# Patient Record
Sex: Female | Born: 1955 | Race: White | Hispanic: No | Marital: Married | Smoking: Never smoker
Health system: Southern US, Community
[De-identification: ages and names within clinical notes are randomized; demographics above are authoritative.]

## PROBLEM LIST (undated history)

## (undated) DIAGNOSIS — D649 Anemia, unspecified: Secondary | ICD-10-CM

## (undated) DIAGNOSIS — K648 Other hemorrhoids: Secondary | ICD-10-CM

## (undated) DIAGNOSIS — N3010 Interstitial cystitis (chronic) without hematuria: Secondary | ICD-10-CM

## (undated) DIAGNOSIS — M5136 Other intervertebral disc degeneration, lumbar region: Secondary | ICD-10-CM

## (undated) DIAGNOSIS — R7612 Nonspecific reaction to cell mediated immunity measurement of gamma interferon antigen response without active tuberculosis: Secondary | ICD-10-CM

## (undated) DIAGNOSIS — M1380 Other specified arthritis, unspecified site: Secondary | ICD-10-CM

## (undated) DIAGNOSIS — R109 Unspecified abdominal pain: Secondary | ICD-10-CM

## (undated) DIAGNOSIS — F329 Major depressive disorder, single episode, unspecified: Secondary | ICD-10-CM

## (undated) DIAGNOSIS — H3553 Other dystrophies primarily involving the sensory retina: Secondary | ICD-10-CM

## (undated) DIAGNOSIS — A159 Respiratory tuberculosis unspecified: Secondary | ICD-10-CM

## (undated) DIAGNOSIS — I10 Essential (primary) hypertension: Secondary | ICD-10-CM

## (undated) DIAGNOSIS — G8929 Other chronic pain: Secondary | ICD-10-CM

## (undated) DIAGNOSIS — R112 Nausea with vomiting, unspecified: Secondary | ICD-10-CM

## (undated) DIAGNOSIS — L03012 Cellulitis of left finger: Secondary | ICD-10-CM

## (undated) DIAGNOSIS — Z9889 Other specified postprocedural states: Secondary | ICD-10-CM

## (undated) DIAGNOSIS — F419 Anxiety disorder, unspecified: Secondary | ICD-10-CM

## (undated) DIAGNOSIS — K819 Cholecystitis, unspecified: Secondary | ICD-10-CM

## (undated) DIAGNOSIS — R519 Headache, unspecified: Secondary | ICD-10-CM

## (undated) DIAGNOSIS — K219 Gastro-esophageal reflux disease without esophagitis: Secondary | ICD-10-CM

## (undated) DIAGNOSIS — F319 Bipolar disorder, unspecified: Secondary | ICD-10-CM

## (undated) DIAGNOSIS — K589 Irritable bowel syndrome without diarrhea: Secondary | ICD-10-CM

## (undated) DIAGNOSIS — M797 Fibromyalgia: Secondary | ICD-10-CM

## (undated) DIAGNOSIS — I7300 Raynaud's syndrome without gangrene: Secondary | ICD-10-CM

## (undated) DIAGNOSIS — M5090 Cervical disc disorder, unspecified, unspecified cervical region: Secondary | ICD-10-CM

## (undated) DIAGNOSIS — T7840XA Allergy, unspecified, initial encounter: Secondary | ICD-10-CM

## (undated) DIAGNOSIS — IMO0002 Reserved for concepts with insufficient information to code with codable children: Secondary | ICD-10-CM

## (undated) DIAGNOSIS — F32A Depression, unspecified: Secondary | ICD-10-CM

## (undated) HISTORY — PX: CYSTOSCOPY: SUR368

## (undated) HISTORY — DX: Other chronic pain: G89.29

## (undated) HISTORY — PX: HERNIA REPAIR: SHX51

## (undated) HISTORY — DX: Fibromyalgia: M79.7

## (undated) HISTORY — DX: Major depressive disorder, single episode, unspecified: F32.9

## (undated) HISTORY — DX: Other dystrophies primarily involving the sensory retina: H35.53

## (undated) HISTORY — DX: Cholecystitis, unspecified: K81.9

## (undated) HISTORY — PX: JOINT REPLACEMENT: SHX530

## (undated) HISTORY — DX: Reserved for concepts with insufficient information to code with codable children: IMO0002

## (undated) HISTORY — DX: Irritable bowel syndrome, unspecified: K58.9

## (undated) HISTORY — DX: Depression, unspecified: F32.A

## (undated) HISTORY — DX: Nonspecific reaction to cell mediated immunity measurement of gamma interferon antigen response without active tuberculosis: R76.12

## (undated) HISTORY — PX: NASAL SINUS SURGERY: SHX719

## (undated) HISTORY — DX: Gastro-esophageal reflux disease without esophagitis: K21.9

## (undated) HISTORY — DX: Other hemorrhoids: K64.8

## (undated) HISTORY — PX: HYSTEROSCOPY W/ ENDOMETRIAL ABLATION: SUR665

## (undated) HISTORY — DX: Unspecified porphyria: E80.20

## (undated) HISTORY — PX: TONSILLECTOMY: SUR1361

## (undated) HISTORY — PX: BUNIONECTOMY: SHX129

## (undated) HISTORY — DX: Anemia, unspecified: D64.9

## (undated) HISTORY — DX: Cellulitis of left finger: L03.012

## (undated) HISTORY — DX: Anxiety disorder, unspecified: F41.9

## (undated) HISTORY — DX: Other intervertebral disc degeneration, lumbar region: M51.36

## (undated) HISTORY — DX: Other specified arthritis, unspecified site: M13.80

## (undated) HISTORY — PX: UPPER GASTROINTESTINAL ENDOSCOPY: SHX188

## (undated) HISTORY — DX: Allergy, unspecified, initial encounter: T78.40XA

---

## 1998-12-03 ENCOUNTER — Ambulatory Visit (HOSPITAL_COMMUNITY): Admission: RE | Admit: 1998-12-03 | Discharge: 1998-12-03 | Payer: Self-pay | Admitting: Gynecology

## 1999-10-22 ENCOUNTER — Other Ambulatory Visit: Admission: RE | Admit: 1999-10-22 | Discharge: 1999-10-22 | Payer: Self-pay | Admitting: *Deleted

## 2001-02-09 ENCOUNTER — Other Ambulatory Visit: Admission: RE | Admit: 2001-02-09 | Discharge: 2001-02-09 | Payer: Self-pay | Admitting: Obstetrics and Gynecology

## 2002-01-11 ENCOUNTER — Ambulatory Visit (HOSPITAL_BASED_OUTPATIENT_CLINIC_OR_DEPARTMENT_OTHER): Admission: RE | Admit: 2002-01-11 | Discharge: 2002-01-11 | Payer: Self-pay | Admitting: *Deleted

## 2002-03-08 ENCOUNTER — Other Ambulatory Visit: Admission: RE | Admit: 2002-03-08 | Discharge: 2002-03-08 | Payer: Self-pay | Admitting: Obstetrics and Gynecology

## 2002-07-25 HISTORY — PX: ANTERIOR CERVICAL DECOMP/DISCECTOMY FUSION: SHX1161

## 2002-08-07 ENCOUNTER — Observation Stay (HOSPITAL_COMMUNITY): Admission: RE | Admit: 2002-08-07 | Discharge: 2002-08-08 | Payer: Self-pay | Admitting: Orthopaedic Surgery

## 2002-11-22 ENCOUNTER — Ambulatory Visit (HOSPITAL_COMMUNITY): Admission: RE | Admit: 2002-11-22 | Discharge: 2002-11-22 | Payer: Self-pay | Admitting: *Deleted

## 2003-04-25 ENCOUNTER — Other Ambulatory Visit: Admission: RE | Admit: 2003-04-25 | Discharge: 2003-04-25 | Payer: Self-pay | Admitting: Obstetrics and Gynecology

## 2003-08-16 ENCOUNTER — Encounter: Admission: RE | Admit: 2003-08-16 | Discharge: 2003-08-16 | Payer: Self-pay | Admitting: Internal Medicine

## 2003-08-25 ENCOUNTER — Encounter: Admission: RE | Admit: 2003-08-25 | Discharge: 2003-08-25 | Payer: Self-pay | Admitting: Internal Medicine

## 2004-03-23 ENCOUNTER — Encounter: Admission: RE | Admit: 2004-03-23 | Discharge: 2004-03-23 | Payer: Self-pay | Admitting: Rheumatology

## 2004-03-27 ENCOUNTER — Encounter: Admission: RE | Admit: 2004-03-27 | Discharge: 2004-03-27 | Payer: Self-pay | Admitting: Orthopedic Surgery

## 2004-04-29 ENCOUNTER — Other Ambulatory Visit: Admission: RE | Admit: 2004-04-29 | Discharge: 2004-04-29 | Payer: Self-pay | Admitting: Obstetrics and Gynecology

## 2004-05-28 ENCOUNTER — Ambulatory Visit: Payer: Self-pay | Admitting: Internal Medicine

## 2004-07-01 ENCOUNTER — Ambulatory Visit: Payer: Self-pay | Admitting: Internal Medicine

## 2004-09-03 ENCOUNTER — Ambulatory Visit: Payer: Self-pay | Admitting: Internal Medicine

## 2004-09-06 ENCOUNTER — Ambulatory Visit: Payer: Self-pay | Admitting: Internal Medicine

## 2004-11-04 ENCOUNTER — Ambulatory Visit: Payer: Self-pay | Admitting: Internal Medicine

## 2005-02-10 ENCOUNTER — Ambulatory Visit: Payer: Self-pay | Admitting: Internal Medicine

## 2005-04-20 ENCOUNTER — Ambulatory Visit: Payer: Self-pay | Admitting: Internal Medicine

## 2005-05-02 ENCOUNTER — Other Ambulatory Visit: Admission: RE | Admit: 2005-05-02 | Discharge: 2005-05-02 | Payer: Self-pay | Admitting: Obstetrics and Gynecology

## 2005-06-19 ENCOUNTER — Encounter: Admission: RE | Admit: 2005-06-19 | Discharge: 2005-06-19 | Payer: Self-pay | Admitting: Rheumatology

## 2005-06-20 ENCOUNTER — Ambulatory Visit: Payer: Self-pay | Admitting: Internal Medicine

## 2005-08-19 ENCOUNTER — Ambulatory Visit: Payer: Self-pay | Admitting: Internal Medicine

## 2005-10-20 ENCOUNTER — Ambulatory Visit: Payer: Self-pay | Admitting: Internal Medicine

## 2005-11-16 ENCOUNTER — Ambulatory Visit: Payer: Self-pay | Admitting: Internal Medicine

## 2005-12-14 ENCOUNTER — Encounter: Admission: RE | Admit: 2005-12-14 | Discharge: 2005-12-14 | Payer: Self-pay | Admitting: Internal Medicine

## 2005-12-14 ENCOUNTER — Ambulatory Visit: Payer: Self-pay | Admitting: Internal Medicine

## 2006-01-18 ENCOUNTER — Ambulatory Visit: Payer: Self-pay | Admitting: Internal Medicine

## 2006-03-07 ENCOUNTER — Ambulatory Visit: Payer: Self-pay | Admitting: Internal Medicine

## 2006-04-17 ENCOUNTER — Ambulatory Visit: Payer: Self-pay | Admitting: Internal Medicine

## 2006-04-27 ENCOUNTER — Ambulatory Visit: Payer: Self-pay | Admitting: Internal Medicine

## 2006-05-17 ENCOUNTER — Ambulatory Visit: Payer: Self-pay | Admitting: Internal Medicine

## 2006-06-12 ENCOUNTER — Ambulatory Visit: Payer: Self-pay | Admitting: Internal Medicine

## 2006-07-11 ENCOUNTER — Ambulatory Visit: Payer: Self-pay | Admitting: Internal Medicine

## 2006-08-09 ENCOUNTER — Ambulatory Visit: Payer: Self-pay | Admitting: Gastroenterology

## 2006-08-14 ENCOUNTER — Ambulatory Visit: Payer: Self-pay | Admitting: Internal Medicine

## 2006-09-11 ENCOUNTER — Ambulatory Visit: Payer: Self-pay | Admitting: Internal Medicine

## 2006-10-10 ENCOUNTER — Ambulatory Visit: Payer: Self-pay | Admitting: Internal Medicine

## 2006-10-17 ENCOUNTER — Ambulatory Visit (HOSPITAL_COMMUNITY): Admission: RE | Admit: 2006-10-17 | Discharge: 2006-10-17 | Payer: Self-pay | Admitting: Gastroenterology

## 2006-10-20 ENCOUNTER — Ambulatory Visit: Payer: Self-pay | Admitting: Internal Medicine

## 2006-10-20 LAB — CONVERTED CEMR LAB
Eosinophils Absolute: 0.1 10*3/uL (ref 0.0–0.6)
Eosinophils Relative: 0.7 % (ref 0.0–5.0)
IgA: 85 mg/dL (ref 68–378)
IgE (Immunoglobulin E), Serum: 89.9 intl units/mL (ref 0.0–180.0)
Lymphocytes Relative: 29.5 % (ref 12.0–46.0)
MCV: 84.7 fL (ref 78.0–100.0)
Monocytes Relative: 6 % (ref 3.0–11.0)
Neutro Abs: 6 10*3/uL (ref 1.4–7.7)
Platelets: 372 10*3/uL (ref 150–400)
RBC: 4.37 M/uL (ref 3.87–5.11)
WBC: 9.6 10*3/uL (ref 4.5–10.5)

## 2006-10-31 ENCOUNTER — Ambulatory Visit: Payer: Self-pay | Admitting: Internal Medicine

## 2006-11-16 ENCOUNTER — Ambulatory Visit: Payer: Self-pay | Admitting: Gastroenterology

## 2006-11-20 LAB — HM COLONOSCOPY

## 2006-11-28 ENCOUNTER — Ambulatory Visit: Payer: Self-pay | Admitting: Gastroenterology

## 2006-12-06 DIAGNOSIS — M797 Fibromyalgia: Secondary | ICD-10-CM | POA: Insufficient documentation

## 2006-12-06 DIAGNOSIS — N3010 Interstitial cystitis (chronic) without hematuria: Secondary | ICD-10-CM | POA: Insufficient documentation

## 2006-12-07 ENCOUNTER — Ambulatory Visit: Payer: Self-pay | Admitting: Internal Medicine

## 2007-02-05 ENCOUNTER — Ambulatory Visit: Payer: Self-pay | Admitting: Internal Medicine

## 2007-03-05 ENCOUNTER — Ambulatory Visit: Payer: Self-pay | Admitting: Internal Medicine

## 2007-03-05 DIAGNOSIS — F3189 Other bipolar disorder: Secondary | ICD-10-CM | POA: Insufficient documentation

## 2007-03-05 DIAGNOSIS — F489 Nonpsychotic mental disorder, unspecified: Secondary | ICD-10-CM | POA: Insufficient documentation

## 2007-03-05 DIAGNOSIS — L719 Rosacea, unspecified: Secondary | ICD-10-CM | POA: Insufficient documentation

## 2007-03-05 DIAGNOSIS — F331 Major depressive disorder, recurrent, moderate: Secondary | ICD-10-CM | POA: Insufficient documentation

## 2007-03-05 DIAGNOSIS — F5105 Insomnia due to other mental disorder: Secondary | ICD-10-CM | POA: Insufficient documentation

## 2007-03-05 DIAGNOSIS — I7300 Raynaud's syndrome without gangrene: Secondary | ICD-10-CM | POA: Insufficient documentation

## 2007-03-05 DIAGNOSIS — K219 Gastro-esophageal reflux disease without esophagitis: Secondary | ICD-10-CM | POA: Insufficient documentation

## 2007-03-05 DIAGNOSIS — R5381 Other malaise: Secondary | ICD-10-CM | POA: Insufficient documentation

## 2007-03-05 DIAGNOSIS — M792 Neuralgia and neuritis, unspecified: Secondary | ICD-10-CM | POA: Insufficient documentation

## 2007-03-05 DIAGNOSIS — M1990 Unspecified osteoarthritis, unspecified site: Secondary | ICD-10-CM | POA: Insufficient documentation

## 2007-03-05 DIAGNOSIS — R5383 Other fatigue: Secondary | ICD-10-CM | POA: Insufficient documentation

## 2007-03-05 DIAGNOSIS — IMO0002 Reserved for concepts with insufficient information to code with codable children: Secondary | ICD-10-CM | POA: Insufficient documentation

## 2007-03-19 ENCOUNTER — Telehealth: Payer: Self-pay | Admitting: Internal Medicine

## 2007-03-29 ENCOUNTER — Telehealth: Payer: Self-pay | Admitting: *Deleted

## 2007-04-12 ENCOUNTER — Ambulatory Visit: Payer: Self-pay | Admitting: Internal Medicine

## 2007-04-12 DIAGNOSIS — J309 Allergic rhinitis, unspecified: Secondary | ICD-10-CM | POA: Insufficient documentation

## 2007-04-12 LAB — CONVERTED CEMR LAB: TSH: 1.1 microintl units/mL (ref 0.35–5.50)

## 2007-05-07 ENCOUNTER — Telehealth: Payer: Self-pay | Admitting: *Deleted

## 2007-05-18 ENCOUNTER — Encounter: Payer: Self-pay | Admitting: Internal Medicine

## 2007-05-25 ENCOUNTER — Ambulatory Visit: Payer: Self-pay | Admitting: Internal Medicine

## 2007-06-14 ENCOUNTER — Telehealth: Payer: Self-pay | Admitting: *Deleted

## 2007-07-05 ENCOUNTER — Encounter: Payer: Self-pay | Admitting: Internal Medicine

## 2007-08-08 ENCOUNTER — Ambulatory Visit: Payer: Self-pay | Admitting: Internal Medicine

## 2007-09-06 ENCOUNTER — Ambulatory Visit: Payer: Self-pay | Admitting: Cardiology

## 2007-09-06 ENCOUNTER — Ambulatory Visit: Payer: Self-pay | Admitting: Internal Medicine

## 2007-09-06 DIAGNOSIS — R1031 Right lower quadrant pain: Secondary | ICD-10-CM | POA: Insufficient documentation

## 2007-09-06 LAB — CONVERTED CEMR LAB
BUN: 12 mg/dL (ref 6–23)
Basophils Absolute: 0 10*3/uL (ref 0.0–0.1)
Basophils Relative: 0 % (ref 0.0–1.0)
CO2: 30 meq/L (ref 19–32)
Calcium: 9.3 mg/dL (ref 8.4–10.5)
Chloride: 93 meq/L — ABNORMAL LOW (ref 96–112)
Creatinine, Ser: 0.9 mg/dL (ref 0.4–1.2)
Hemoglobin: 13.7 g/dL (ref 12.0–15.0)
MCHC: 33 g/dL (ref 30.0–36.0)
Monocytes Relative: 9.3 % (ref 3.0–11.0)
Platelets: 323 10*3/uL (ref 150–400)
Potassium: 4.5 meq/L (ref 3.5–5.1)
RBC: 4.78 M/uL (ref 3.87–5.11)
RDW: 12.4 % (ref 11.5–14.6)

## 2007-09-14 ENCOUNTER — Ambulatory Visit (HOSPITAL_BASED_OUTPATIENT_CLINIC_OR_DEPARTMENT_OTHER): Admission: RE | Admit: 2007-09-14 | Discharge: 2007-09-14 | Payer: Self-pay | Admitting: Urology

## 2007-10-11 ENCOUNTER — Ambulatory Visit: Payer: Self-pay | Admitting: Internal Medicine

## 2007-10-11 DIAGNOSIS — K5900 Constipation, unspecified: Secondary | ICD-10-CM | POA: Insufficient documentation

## 2007-10-11 DIAGNOSIS — K5903 Drug induced constipation: Secondary | ICD-10-CM | POA: Insufficient documentation

## 2007-10-15 ENCOUNTER — Telehealth: Payer: Self-pay | Admitting: Internal Medicine

## 2007-11-09 ENCOUNTER — Ambulatory Visit: Payer: Self-pay | Admitting: Internal Medicine

## 2007-11-09 DIAGNOSIS — F5104 Psychophysiologic insomnia: Secondary | ICD-10-CM | POA: Insufficient documentation

## 2007-11-09 DIAGNOSIS — G4700 Insomnia, unspecified: Secondary | ICD-10-CM | POA: Insufficient documentation

## 2007-11-20 ENCOUNTER — Encounter: Payer: Self-pay | Admitting: Internal Medicine

## 2007-11-21 ENCOUNTER — Ambulatory Visit: Payer: Self-pay | Admitting: Gastroenterology

## 2007-11-26 ENCOUNTER — Telehealth: Payer: Self-pay | Admitting: Internal Medicine

## 2007-12-07 ENCOUNTER — Ambulatory Visit: Payer: Self-pay | Admitting: Internal Medicine

## 2007-12-12 ENCOUNTER — Telehealth: Payer: Self-pay | Admitting: Internal Medicine

## 2007-12-25 ENCOUNTER — Encounter: Payer: Self-pay | Admitting: Internal Medicine

## 2007-12-27 ENCOUNTER — Ambulatory Visit: Payer: Self-pay | Admitting: Internal Medicine

## 2007-12-28 ENCOUNTER — Encounter: Payer: Self-pay | Admitting: Internal Medicine

## 2008-01-29 ENCOUNTER — Encounter: Payer: Self-pay | Admitting: Internal Medicine

## 2008-01-31 ENCOUNTER — Ambulatory Visit: Payer: Self-pay | Admitting: Internal Medicine

## 2008-01-31 DIAGNOSIS — R635 Abnormal weight gain: Secondary | ICD-10-CM | POA: Insufficient documentation

## 2008-01-31 DIAGNOSIS — K120 Recurrent oral aphthae: Secondary | ICD-10-CM | POA: Insufficient documentation

## 2008-02-28 ENCOUNTER — Ambulatory Visit: Payer: Self-pay | Admitting: Internal Medicine

## 2008-02-28 DIAGNOSIS — R252 Cramp and spasm: Secondary | ICD-10-CM | POA: Insufficient documentation

## 2008-02-28 DIAGNOSIS — R079 Chest pain, unspecified: Secondary | ICD-10-CM | POA: Insufficient documentation

## 2008-02-28 LAB — CONVERTED CEMR LAB
Folate: 10.6 ng/mL
Magnesium: 2.4 mg/dL (ref 1.5–2.5)
Potassium: 5 meq/L (ref 3.5–5.1)
Vit D, 1,25-Dihydroxy: 42 (ref 30–89)
Vitamin B-12: 632 pg/mL (ref 211–911)

## 2008-04-28 ENCOUNTER — Ambulatory Visit: Payer: Self-pay | Admitting: Internal Medicine

## 2008-05-27 ENCOUNTER — Ambulatory Visit: Payer: Self-pay | Admitting: Internal Medicine

## 2008-06-03 ENCOUNTER — Encounter: Payer: Self-pay | Admitting: Internal Medicine

## 2008-06-03 ENCOUNTER — Telehealth: Payer: Self-pay | Admitting: Internal Medicine

## 2008-06-26 ENCOUNTER — Ambulatory Visit: Payer: Self-pay | Admitting: Internal Medicine

## 2008-07-07 ENCOUNTER — Encounter: Payer: Self-pay | Admitting: Internal Medicine

## 2008-07-25 HISTORY — PX: BREAST ENHANCEMENT SURGERY: SHX7

## 2008-07-28 ENCOUNTER — Ambulatory Visit: Payer: Self-pay | Admitting: Internal Medicine

## 2008-09-09 ENCOUNTER — Ambulatory Visit: Payer: Self-pay | Admitting: Internal Medicine

## 2008-10-06 ENCOUNTER — Encounter: Payer: Self-pay | Admitting: Internal Medicine

## 2008-10-08 ENCOUNTER — Encounter: Payer: Self-pay | Admitting: Internal Medicine

## 2008-10-29 ENCOUNTER — Ambulatory Visit: Payer: Self-pay | Admitting: Internal Medicine

## 2008-10-29 LAB — CONVERTED CEMR LAB
ALT: 13 units/L (ref 0–35)
AST: 18 units/L (ref 0–37)
Alkaline Phosphatase: 49 units/L (ref 39–117)
Basophils Relative: 0.4 % (ref 0.0–3.0)
Bilirubin, Direct: 0 mg/dL (ref 0.0–0.3)
Calcium: 9.3 mg/dL (ref 8.4–10.5)
Chloride: 105 meq/L (ref 96–112)
Creatinine, Ser: 0.6 mg/dL (ref 0.4–1.2)
Eosinophils Relative: 1.1 % (ref 0.0–5.0)
HDL: 70.4 mg/dL (ref >39.00)
Hemoglobin: 11.8 g/dL — ABNORMAL LOW (ref 12.0–15.0)
Ketones, urine, test strip: NEGATIVE
LDL Cholesterol: 82 mg/dL (ref 0–99)
Lymphocytes Relative: 32.7 % (ref 12.0–46.0)
Neutro Abs: 4.1 10*3/uL (ref 1.4–7.7)
Neutrophils Relative %: 57.4 % (ref 43.0–77.0)
Nitrite: NEGATIVE
RBC: 4.01 M/uL (ref 3.87–5.11)
Sodium: 139 meq/L (ref 135–145)
Total CHOL/HDL Ratio: 2
Total Protein: 6.5 g/dL (ref 6.0–8.3)
Triglycerides: 111 mg/dL (ref 0.0–149.0)
Urobilinogen, UA: 0.2
WBC Urine, dipstick: NEGATIVE
WBC: 7.1 10*3/uL (ref 4.5–10.5)

## 2008-11-05 ENCOUNTER — Ambulatory Visit: Payer: Self-pay | Admitting: Internal Medicine

## 2008-12-30 ENCOUNTER — Ambulatory Visit: Payer: Self-pay | Admitting: Internal Medicine

## 2008-12-30 DIAGNOSIS — R634 Abnormal weight loss: Secondary | ICD-10-CM | POA: Insufficient documentation

## 2008-12-30 LAB — CONVERTED CEMR LAB
AST: 22 units/L (ref 0–37)
Alkaline Phosphatase: 51 units/L (ref 39–117)
Bilirubin, Direct: 0.1 mg/dL (ref 0.0–0.3)
Hgb A1c MFr Bld: 5.9 % (ref 4.6–6.5)
TSH: 1.12 microintl units/mL (ref 0.35–5.50)
Total Bilirubin: 0.5 mg/dL (ref 0.3–1.2)

## 2008-12-31 ENCOUNTER — Encounter: Payer: Self-pay | Admitting: Internal Medicine

## 2009-01-01 ENCOUNTER — Telehealth: Payer: Self-pay | Admitting: Internal Medicine

## 2009-01-05 ENCOUNTER — Telehealth: Payer: Self-pay | Admitting: Internal Medicine

## 2009-01-06 ENCOUNTER — Telehealth: Payer: Self-pay | Admitting: Internal Medicine

## 2009-01-20 ENCOUNTER — Telehealth: Payer: Self-pay | Admitting: Internal Medicine

## 2009-01-20 ENCOUNTER — Ambulatory Visit: Payer: Self-pay | Admitting: Internal Medicine

## 2009-01-21 ENCOUNTER — Encounter: Payer: Self-pay | Admitting: Internal Medicine

## 2009-01-28 ENCOUNTER — Ambulatory Visit: Payer: Self-pay | Admitting: Internal Medicine

## 2009-02-10 ENCOUNTER — Encounter: Payer: Self-pay | Admitting: Internal Medicine

## 2009-03-12 ENCOUNTER — Encounter: Payer: Self-pay | Admitting: Internal Medicine

## 2009-03-17 ENCOUNTER — Ambulatory Visit: Payer: Self-pay | Admitting: Internal Medicine

## 2009-03-17 DIAGNOSIS — D62 Acute posthemorrhagic anemia: Secondary | ICD-10-CM | POA: Insufficient documentation

## 2009-03-17 DIAGNOSIS — D638 Anemia in other chronic diseases classified elsewhere: Secondary | ICD-10-CM

## 2009-03-17 LAB — CONVERTED CEMR LAB
HCT: 33.1 %
MCV: 84.4 fL
Platelets: 306 10*3/uL

## 2009-03-20 ENCOUNTER — Encounter: Payer: Self-pay | Admitting: Internal Medicine

## 2009-03-24 ENCOUNTER — Ambulatory Visit (HOSPITAL_COMMUNITY): Admission: RE | Admit: 2009-03-24 | Discharge: 2009-03-24 | Payer: Self-pay | Admitting: Rheumatology

## 2009-03-26 ENCOUNTER — Encounter: Payer: Self-pay | Admitting: Internal Medicine

## 2009-04-30 ENCOUNTER — Ambulatory Visit: Payer: Self-pay | Admitting: Internal Medicine

## 2009-04-30 DIAGNOSIS — M659 Synovitis and tenosynovitis, unspecified: Secondary | ICD-10-CM | POA: Insufficient documentation

## 2009-06-22 ENCOUNTER — Telehealth: Payer: Self-pay | Admitting: Internal Medicine

## 2009-07-02 ENCOUNTER — Ambulatory Visit: Payer: Self-pay | Admitting: Internal Medicine

## 2009-07-28 ENCOUNTER — Encounter: Payer: Self-pay | Admitting: Internal Medicine

## 2009-07-30 ENCOUNTER — Ambulatory Visit: Payer: Self-pay | Admitting: Internal Medicine

## 2009-08-27 ENCOUNTER — Encounter: Payer: Self-pay | Admitting: Internal Medicine

## 2009-10-01 ENCOUNTER — Ambulatory Visit: Payer: Self-pay | Admitting: Internal Medicine

## 2009-10-15 ENCOUNTER — Ambulatory Visit: Payer: Self-pay | Admitting: Internal Medicine

## 2009-10-15 DIAGNOSIS — N3000 Acute cystitis without hematuria: Secondary | ICD-10-CM | POA: Insufficient documentation

## 2009-10-15 LAB — CONVERTED CEMR LAB
Glucose, Urine, Semiquant: NEGATIVE
Urobilinogen, UA: 0.2

## 2009-10-16 ENCOUNTER — Encounter: Payer: Self-pay | Admitting: Internal Medicine

## 2009-11-09 ENCOUNTER — Encounter: Payer: Self-pay | Admitting: Internal Medicine

## 2009-12-01 ENCOUNTER — Encounter: Payer: Self-pay | Admitting: Internal Medicine

## 2009-12-07 ENCOUNTER — Telehealth: Payer: Self-pay | Admitting: Internal Medicine

## 2009-12-09 ENCOUNTER — Telehealth: Payer: Self-pay | Admitting: Internal Medicine

## 2009-12-17 ENCOUNTER — Telehealth: Payer: Self-pay | Admitting: Internal Medicine

## 2009-12-29 ENCOUNTER — Ambulatory Visit: Payer: Self-pay | Admitting: Internal Medicine

## 2010-01-07 ENCOUNTER — Encounter: Payer: Self-pay | Admitting: Internal Medicine

## 2010-01-22 ENCOUNTER — Telehealth: Payer: Self-pay | Admitting: Internal Medicine

## 2010-01-28 ENCOUNTER — Telehealth: Payer: Self-pay | Admitting: Internal Medicine

## 2010-03-02 ENCOUNTER — Ambulatory Visit: Payer: Self-pay | Admitting: Internal Medicine

## 2010-03-25 ENCOUNTER — Encounter: Payer: Self-pay | Admitting: Internal Medicine

## 2010-03-25 HISTORY — PX: AUGMENTATION MAMMAPLASTY: SUR837

## 2010-03-26 ENCOUNTER — Ambulatory Visit: Payer: Self-pay | Admitting: Internal Medicine

## 2010-03-31 ENCOUNTER — Encounter: Payer: Self-pay | Admitting: Internal Medicine

## 2010-04-19 ENCOUNTER — Encounter: Payer: Self-pay | Admitting: *Deleted

## 2010-04-19 ENCOUNTER — Telehealth: Payer: Self-pay | Admitting: Internal Medicine

## 2010-05-28 ENCOUNTER — Telehealth (INDEPENDENT_AMBULATORY_CARE_PROVIDER_SITE_OTHER): Payer: Self-pay | Admitting: *Deleted

## 2010-05-28 ENCOUNTER — Ambulatory Visit: Payer: Self-pay | Admitting: Internal Medicine

## 2010-05-28 DIAGNOSIS — M5010 Cervical disc disorder with radiculopathy, unspecified cervical region: Secondary | ICD-10-CM | POA: Insufficient documentation

## 2010-06-02 ENCOUNTER — Encounter: Admission: RE | Admit: 2010-06-02 | Discharge: 2010-06-02 | Payer: Self-pay | Admitting: Internal Medicine

## 2010-06-14 ENCOUNTER — Telehealth: Payer: Self-pay | Admitting: Internal Medicine

## 2010-06-23 ENCOUNTER — Telehealth: Payer: Self-pay | Admitting: Internal Medicine

## 2010-07-06 ENCOUNTER — Encounter: Payer: Self-pay | Admitting: Internal Medicine

## 2010-07-12 ENCOUNTER — Telehealth: Payer: Self-pay | Admitting: Internal Medicine

## 2010-07-30 ENCOUNTER — Ambulatory Visit
Admission: RE | Admit: 2010-07-30 | Discharge: 2010-07-30 | Payer: Self-pay | Source: Home / Self Care | Attending: Internal Medicine | Admitting: Internal Medicine

## 2010-07-30 ENCOUNTER — Other Ambulatory Visit: Payer: Self-pay | Admitting: Internal Medicine

## 2010-07-30 LAB — CBC WITH DIFFERENTIAL/PLATELET
Basophils Absolute: 0 10*3/uL (ref 0.0–0.1)
Basophils Relative: 0.5 % (ref 0.0–3.0)
Eosinophils Absolute: 0.1 10*3/uL (ref 0.0–0.7)
Eosinophils Relative: 1.4 % (ref 0.0–5.0)
HCT: 37.9 % (ref 36.0–46.0)
Hemoglobin: 12.8 g/dL (ref 12.0–15.0)
Lymphocytes Relative: 28 % (ref 12.0–46.0)
Lymphs Abs: 2.3 10*3/uL (ref 0.7–4.0)
MCHC: 33.8 g/dL (ref 30.0–36.0)
MCV: 95.5 fl (ref 78.0–100.0)
Monocytes Absolute: 0.5 10*3/uL (ref 0.1–1.0)
Monocytes Relative: 5.7 % (ref 3.0–12.0)
Neutro Abs: 5.3 10*3/uL (ref 1.4–7.7)
Neutrophils Relative %: 64.4 % (ref 43.0–77.0)
Platelets: 286 10*3/uL (ref 150.0–400.0)
RBC: 3.97 Mil/uL (ref 3.87–5.11)
RDW: 13.5 % (ref 11.5–14.6)
WBC: 8.2 10*3/uL (ref 4.5–10.5)

## 2010-07-30 LAB — HIGH SENSITIVITY CRP: CRP, High Sensitivity: 0.8 mg/L (ref 0.00–5.00)

## 2010-07-30 LAB — BASIC METABOLIC PANEL
BUN: 8 mg/dL (ref 6–23)
CO2: 29 mEq/L (ref 19–32)
Calcium: 9.1 mg/dL (ref 8.4–10.5)
Chloride: 105 mEq/L (ref 96–112)
Creatinine, Ser: 0.6 mg/dL (ref 0.4–1.2)
GFR: 109.25 mL/min (ref >60.00)
Glucose, Bld: 88 mg/dL (ref 70–99)
Potassium: 4.4 mEq/L (ref 3.5–5.1)
Sodium: 141 mEq/L (ref 135–145)

## 2010-07-30 LAB — HEPATIC FUNCTION PANEL
ALT: 26 U/L (ref 0–35)
AST: 27 U/L (ref 0–37)
Albumin: 4 g/dL (ref 3.5–5.2)
Alkaline Phosphatase: 50 U/L (ref 39–117)
Bilirubin, Direct: 0.1 mg/dL (ref 0.0–0.3)
Total Bilirubin: 0.5 mg/dL (ref 0.3–1.2)
Total Protein: 6.3 g/dL (ref 6.0–8.3)

## 2010-07-30 LAB — TSH: TSH: 2.13 u[IU]/mL (ref 0.35–5.50)

## 2010-08-14 ENCOUNTER — Encounter: Payer: Self-pay | Admitting: Neurosurgery

## 2010-08-24 NOTE — Assessment & Plan Note (Signed)
Summary: 6 wks rov/mm pt rsc due death in family/njr   Vital Signs:  Patient profile:   55 year old female Height:      68 inches Weight:      126 pounds BMI:     19.23 Temp:     98.2 degrees F oral Pulse rate:   72 / minute Resp:     14 per minute BP sitting:   110 / 70  (left arm)  Vitals Entered By: Willy Eddy, LPN (March 02, 2010 8:59 AM) CC: roa Is Patient Diabetic? No   Primary Care Provider:  Stacie Glaze MD  CC:  roa.  History of Present Illness: follow up of problems fibromyalgia flair father passed due to esophageal cancer GERD daughter and family have moved in stress has caused a flair in [place] with increased fatigue and day time somnolence fatigue did not respond to nuvigil in past severe pain 9/10  Preventive Screening-Counseling & Management  Alcohol-Tobacco     Smoking Status: never  Problems Prior to Update: 1)  Acute Cystitis  (ICD-595.0) 2)  Neck Pain, Chronic  (ICD-723.1) 3)  Unspecified Synovitis and Tenosynovitis  (ICD-727.00) 4)  Constipation, Drug Induced  (ICD-564.09) 5)  Anemia of Chronic Disease  (ICD-285.29) 6)  Weight Loss, Abnormal  (ICD-783.21) 7)  Physical Examination  (ICD-V70.0) 8)  Family History of Cad Female 1st Degree Relative <50  (ICD-V17.3) 9)  Chest Pain Unspecified  (ICD-786.50) 10)  Cramp in Limb  (ICD-729.82) 11)  Aphthous Ulcers  (ICD-528.2) 12)  Abnormal Weight Gain  (ICD-783.1) 13)  Insomnia, Chronic  (ICD-307.42) 14)  Constipation, Drug Induced  (ICD-564.09) 15)  Abdominal Pain Right Lower Quadrant  (ICD-789.03) 16)  Allergic Rhinitis  (ICD-477.9) 17)  Symptom, Malaise and Fatigue Nec  (ICD-780.79) 18)  Disorders, Orgnc Insomnia D/t Mental Disorder  (ICD-327.02) 19)  Depression  (ICD-311) 20)  Symptom, Malaise and Fatigue Nec  (ICD-780.79) 21)  [name] Syndrome  (ICD-443.0) 22)  Rosacea  (ICD-695.3) 23)  Gerd  (ICD-530.81) 24)  Neuralgia  (ICD-729.2) 25)  Osteoarthritis  (ICD-715.90) 26)   Fibromyalgia, Severe  (ICD-729.1) 27)  Cystitis, Chronic Interstitial  (ICD-595.1) 28)  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)  Current Problems (verified): 1)  Acute Cystitis  (ICD-595.0) 2)  Neck Pain, Chronic  (ICD-723.1) 3)  Unspecified Synovitis and Tenosynovitis  (ICD-727.00) 4)  Constipation, Drug Induced  (ICD-564.09) 5)  Anemia of Chronic Disease  (ICD-285.29) 6)  Weight Loss, Abnormal  (ICD-783.21) 7)  Physical Examination  (ICD-V70.0) 8)  Family History of Cad Female 1st Degree Relative <50  (ICD-V17.3) 9)  Chest Pain Unspecified  (ICD-786.50) 10)  Cramp in Limb  (ICD-729.82) 11)  Aphthous Ulcers  (ICD-528.2) 12)  Abnormal Weight Gain  (ICD-783.1) 13)  Insomnia, Chronic  (ICD-307.42) 14)  Constipation, Drug Induced  (ICD-564.09) 15)  Abdominal Pain Right Lower Quadrant  (ICD-789.03) 16)  Allergic Rhinitis  (ICD-477.9) 17)  Symptom, Malaise and Fatigue Nec  (ICD-780.79) 18)  Disorders, Orgnc Insomnia D/t Mental Disorder  (ICD-327.02) 19)  Depression  (ICD-311) 20)  Symptom, Malaise and Fatigue Nec  (ICD-780.79) 21)  [name] Syndrome  (ICD-443.0) 22)  Rosacea  (ICD-695.3) 23)  Gerd  (ICD-530.81) 24)  Neuralgia  (ICD-729.2) 25)  Osteoarthritis  (ICD-715.90) 26)  Fibromyalgia, Severe  (ICD-729.1) 27)  Cystitis, Chronic Interstitial  (ICD-595.1) 28)  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 Take 1/2 Tablet By Mouth Once A Day 2)  Aspirin 81 Mg Tbec (Aspirin) .... Take 1 Tablet By Mouth Every Morning 3)  D400 400 Unit Caps (Cholecalciferol) .... Take Twice A Day 4)  Elmiron 100 Mg Caps (Pentosan Polysulfate Sodium) .... Take 2 By Mouth Twice A Day 5)  Hydroxychloroquine Sulfate 200 Mg Tabs (Hydroxychloroquine Sulfate) .Marland Kitchen.. 1 Two Times A Day 6)  Hydroxyzine Hcl 25 Mg Tabs (Hydroxyzine Hcl) .... 2 Tablet By Mouth At Bedtime 7)  Lidoderm 5 % Ptch (Lidocaine) .... Apply 3 Patch To Skin Once A Day 8)  Lyrica  150 Mg Caps (Pregabalin) .... Take 1 Capsule By Mouth Three Times A Day 9)  Nasacort Aq 55 Mcg/act Aers (Triamcinolone Acetonide(Nasal)) .... Spray 2 Spray Into Both Nostrils Once A Day 10)  Phenazopyridine Plus 150-15-0.3 Mg Tabs (Phenazopyridine-Butabarb-Hyosc) .... One By Mouth  Three Times A Day Prn 11)  Sm Calcium/vitamin D 500-200 Mg-Unit Tabs (Calcium Carbonate-Vitamin D) .... Take 1 Tablet By Mouth Three Times A Day 12)  Xanax 1 Mg Tabs (Alprazolam) .... Take 1 Tablet By Mouth Three Times A Day 13)  Norvasc 5 Mg  Tabs (Amlodipine Besylate) .... Once Daily 14)  Lamictal 150 Mg Tabs (Lamotrigine) .Marland Kitchen.. 1 Two Times A Day 15)  Zofran 4 Mg  Tabs (Ondansetron Hcl) .... As Needed 16)  Diazepam   Powd (Diazepam) 17)  Valium Vag Supp .... As Needed 18)  Ritalin Sr 20 Mg  Tbcr (Methylphenidate Hcl) .... One By Mouth Daily in Am 19)  Zegerid 40-1100 Mg Caps (Omeprazole-Sodium Bicarbonate) .... One By Mouth Daily 20)  Amitiza 24 Mcg  Caps (Lubiprostone) .... Two Times A Day  With Food 21)  Cobal-1000 1000 Mcg/ml Inj Soln (Cyanocobalamin) .Marland Kitchen.. 1 Ml Im Q Month 22)  Desipramine Hcl 100 Mg Tabs (Desipramine Hcl) .Marland Kitchen.. 1-4 By Mouth Q Hs Prn 23)  Majic Mouth Wash .Marland Kitchen.. 10 Cc Gargle and Spit Tid 24)  Avinza 75 Mg Xr24h-Cap (Morphine Sulfate Beads) 25)  Estring 2 Mg Ring (Estradiol) .... Use As Directed 26)  Lactulose 10 Gm/48ml Soln (Lactulose) .... 30 Cc By Mouth Q 8 Hoiurs As Needed For Constipation Hold For Loose Stools 27)  Baclofen 10 Mg Tabs (Baclofen) .Marland Kitchen.. 1 Three Times A Day 28)  Slow Release Iron 47.5 Mg  Cr-Tabs (Ferrous Sulfate) .... One By Mouth Daily 29)  Methotrexate 2.5 Mg Tabs (Methotrexate Sodium) .... 7 By Mouth Week, The 6 By Mouth Weekly The 5 By Mouth Weekly The 4 By Mouth Weekly The 3 By Mouth Weekly The Stop 30)  Folic Acid 1 Mg Tabs (Folic Acid) .... 2 Once Daily 31)  Lunesta 3 Mg Tabs (Eszopiclone) .... One By Mouth Q Hs 32)  Estrace 1 Mg Tabs (Estradiol) .Marland Kitchen.. 1 Once Daily 33)   Medroxyprogesterone Acetate 5 Mg Tabs (Medroxyprogesterone Acetate) .Marland Kitchen.. 1 Once Daily 34)  Cyclosporine 25 Mg Caps (Cyclosporine) .... 3 Two Times A Day 35)  Morphine Sulfate 15 Mg Tabs (Morphine Sulfate) .Marland Kitchen.. 1 Every 6-8 Hours As Needed For Break Thru Pain  Current Medications (verified): 1)  Acyclovir 800 Mg Tabs (Acyclovir) .... Take 1/2 Tablet By Mouth Once A Day 2)  Aspirin 81 Mg Tbec (Aspirin) .... Take 1 Tablet By Mouth Every Morning 3)  D400 400 Unit Caps (Cholecalciferol) .... Take Twice A Day 4)  Elmiron 100 Mg Caps (Pentosan Polysulfate Sodium) .... Take 2 By Mouth Twice A Day 5)  Hydroxychloroquine Sulfate 200 Mg Tabs (Hydroxychloroquine Sulfate) .Marland Kitchen.. 1 Two Times A Day 6)  Hydroxyzine Hcl 25 Mg Tabs (Hydroxyzine  Hcl) .... 2 Tablet By Mouth At Bedtime 7)  Lidoderm 5 % Ptch (Lidocaine) .... Apply 3 Patch To Skin Once A Day 8)  Lyrica 150 Mg Caps (Pregabalin) .... Take 1 Capsule By Mouth Three Times A Day 9)  Nasacort Aq 55 Mcg/act Aers (Triamcinolone Acetonide(Nasal)) .... Spray 2 Spray Into Both Nostrils Once A Day 10)  Phenazopyridine Plus 150-15-0.3 Mg Tabs (Phenazopyridine-Butabarb-Hyosc) .... One By Mouth  Three Times A Day Prn 11)  Sm Calcium/vitamin D 500-200 Mg-Unit Tabs (Calcium Carbonate-Vitamin D) .... Take 1 Tablet By Mouth Three Times A Day 12)  Xanax 1 Mg Tabs (Alprazolam) .... Take 1 Tablet By Mouth Three Times A Day 13)  Norvasc 5 Mg  Tabs (Amlodipine Besylate) .... Once Daily 14)  Lamictal 150 Mg Tabs (Lamotrigine) .Marland Kitchen.. 1 Two Times A Day 15)  Zofran 4 Mg  Tabs (Ondansetron Hcl) .... As Needed 16)  Diazepam   Powd (Diazepam) 17)  Valium Vag Supp .... As Needed 18)  Amphetamine-Dextroamphetamine 30 Mg Xr24h-Cap (Amphetamine-Dextroamphetamine) .... One By Mouth Daily in Am 19)  Zegerid 40-1100 Mg Caps (Omeprazole-Sodium Bicarbonate) .... One By Mouth Daily 20)  Amitiza 24 Mcg  Caps (Lubiprostone) .... Two Times A Day  With Food 21)  Cobal-1000 1000 Mcg/ml Inj Soln  (Cyanocobalamin) .Marland Kitchen.. 1 Ml Im Q Month 22)  Desipramine Hcl 100 Mg Tabs (Desipramine Hcl) .Marland Kitchen.. 1-4 By Mouth Q Hs Prn 23)  Majic Mouth Wash .Marland Kitchen.. 10 Cc Gargle and Spit Tid 24)  Avinza 90 Mg Xr24h-Cap (Morphine Sulfate Beads) .Marland Kitchen.. 1 Once Daily 25)  Estring 2 Mg Ring (Estradiol) .... Use As Directed 26)  Lactulose 10 Gm/40ml Soln (Lactulose) .... 30 Cc By Mouth Q 8 Hoiurs As Needed For Constipation Hold For Loose Stools 27)  Baclofen 10 Mg Tabs (Baclofen) .Marland Kitchen.. 1 Three Times A Day 28)  Slow Release Iron 47.5 Mg  Cr-Tabs (Ferrous Sulfate) .... One By Mouth Daily 29)  Methotrexate 2.5 Mg Tabs (Methotrexate Sodium) .... 8 Per Week 30)  Folic Acid 1 Mg Tabs (Folic Acid) .... 2 Once Daily 31)  Lunesta 3 Mg Tabs (Eszopiclone) .... One By Mouth Q Hs 32)  Estrace 1 Mg Tabs (Estradiol) .Marland Kitchen.. 1 Once Daily 33)  Medroxyprogesterone Acetate 5 Mg Tabs (Medroxyprogesterone Acetate) .Marland Kitchen.. 1 Once Daily 34)  Morphine Sulfate 15 Mg Tabs (Morphine Sulfate) .Marland Kitchen.. 1 Every 6-8 Hours As Needed For Break Thru Pain  Allergies (verified): 1)  ! Demerol (Meperidine Hcl) 2)  ! * Seritonine Uptake Inhibitors 3)  Codeine Phosphate (Codeine Phosphate) 4)  Sulfamethoxazole (Sulfamethoxazole)  Past History:  Family History: Last updated: 07/28/2008 Family History of CAD Female 1st degree relative <50 father wit esophageal cancer  Social History: Last updated: 04/28/2008 Retired Married Never Smoked Alcohol use-no Drug use-no  Risk Factors: Smoking Status: never (03/02/2010)  Past medical, surgical, family and social histories (including risk factors) reviewed, and no changes noted (except as noted below).  Past Medical History: Reviewed history from 04/28/2008 and no changes required. 277.1/pct ibs/564.1 interstitial cystitis fibromyalgia Osteoarthritis GERD Depression Allergic rhinitis chronic pain  Past Surgical History: Reviewed history from 04/12/2007 and no changes required. Cervical fusion Cervical  laminectomy cystoscopy Inguinal herniorrhaphy Sinus surgery GYN surgery hysteroscopy with ablation  Family History: Reviewed history from 07/28/2008 and no changes required. Family History of CAD Female 1st degree relative <50 father wit esophageal cancer  Social History: Reviewed history from 04/28/2008 and no changes required. Retired Married Never Smoked Alcohol use-no Drug use-no  Review of Systems  The patient denies anorexia, fever, weight loss, weight gain, vision loss, decreased hearing, hoarseness, chest pain, syncope, dyspnea on exertion, peripheral edema, prolonged cough, headaches, hemoptysis, abdominal pain, melena, hematochezia, severe indigestion/heartburn, hematuria, incontinence, genital sores, muscle weakness, suspicious skin lesions, transient blindness, difficulty walking, depression, unusual weight change, abnormal bleeding, enlarged lymph nodes, angioedema, breast masses, and testicular masses.    Physical Exam  General:  underweight appearing and pale.   Eyes:  pupils equal and pupils round.   Ears:  R ear normal and L ear normal.   Nose:  nasal dischargemucosal pallor.   Mouth:  Oral mucosa and oropharynx without lesions or exudates.  Teeth in good repair. Neck:  No deformities, masses, muslce tenderness notes Lungs:  Normal respiratory effort, chest expands symmetrically. Lungs are clear to auscultation, no crackles or wheezes. Heart:  Normal rate and regular rhythm. S1 and S2 normal without gallop, click, rub or other extra sounds.Grade   1/6 systolic ejection murmur.   Abdomen:  Bowel sounds positive,abdomen soft and non-tender without masses, organomegaly or hernias noted. Msk:  joint swelling and joint warmth.   Neurologic:  alert & oriented X3, sensation intact to pinprick, gait normal, and finger-to-nose normal.     Impression & Recommendations:  Problem # 1:  ANEMIA OF CHRONIC DISEASE (ICD-285.29)  Her updated medication list for this problem  includes:    Cobal-1000 1000 Mcg/ml Inj Soln (Cyanocobalamin) .Marland Kitchen... 1 ml im q month    Slow Release Iron 47.5 Mg Cr-tabs (Ferrous sulfate) ..... One by mouth daily    Folic Acid 1 Mg Tabs (Folic acid) .Marland Kitchen... 2 once daily  Hgb: 10.8 (03/17/2009)   Hct: 33.1 (03/17/2009)   Platelets: 306 (03/17/2009) RBC: 4.01 (10/29/2008)   RDW: 13.9 (10/29/2008)   WBC: 5.4 (03/17/2009) MCV: 84.4 (03/17/2009)   MCHC: 34.3 (10/29/2008) B12: 393 (10/29/2008)   Folate: 10.6 (02/28/2008)   TSH: 1.12 (12/30/2008)  Problem # 2:  DEPRESSION (ICD-311)  Her updated medication list for this problem includes:    Hydroxyzine Hcl 25 Mg Tabs (Hydroxyzine hcl) .Marland Kitchen... 2 tablet by mouth at bedtime    Xanax 1 Mg Tabs (Alprazolam) .Marland Kitchen... Take 1 tablet by mouth three times a day    Desipramine Hcl 100 Mg Tabs (Desipramine hcl) .Marland Kitchen... 1-4 by mouth q hs prn  Discussed treatment options, including trial of antidpressant medication. Will refer to behavioral health. Follow-up call in in 24-48 hours and recheck in 2 weeks, sooner as needed. Patient agrees to call if any worsening of symptoms or thoughts of doing harm arise. Verified that the patient has no suicidal ideation at this time.   Problem # 3:  FIBROMYALGIA, SEVERE (ICD-729.1) due to the stress in her family Her updated medication list for this problem includes:    Aspirin 81 Mg Tbec (Aspirin) .Marland Kitchen... Take 1 tablet by mouth every morning    Avinza 90 Mg Xr24h-cap (Morphine sulfate beads) .Marland Kitchen... 1 once daily    Baclofen 10 Mg Tabs (Baclofen) .Marland Kitchen... 1 three times a day    Morphine Sulfate 15 Mg Tabs (Morphine sulfate) .Marland Kitchen... 1 every 6-8 hours as needed for break thru pain  Problem # 4:  CYSTITIS, CHRONIC INTERSTITIAL (ICD-595.1) the cyclosporin side efects resulted in more sweeling in her legs and joints and she had to resume the methotrexate and resumed the 90 of avinza  Problem # 5:  SYMPTOM, MALAISE AND FATIGUE NEC (ICD-780.79) change to generic adderall 30xr daily  Complete  Medication List: 1)  Acyclovir 800 Mg Tabs (  Acyclovir) .... Take 1/2 tablet by mouth once a day 2)  Aspirin 81 Mg Tbec (Aspirin) .... Take 1 tablet by mouth every morning 3)  D400 400 Unit Caps (Cholecalciferol) .... Take twice a day 4)  Elmiron 100 Mg Caps (Pentosan polysulfate sodium) .... Take 2 by mouth twice a day 5)  Hydroxychloroquine Sulfate 200 Mg Tabs (Hydroxychloroquine sulfate) .Marland Kitchen.. 1 two times a day 6)  Hydroxyzine Hcl 25 Mg Tabs (Hydroxyzine hcl) .... 2 tablet by mouth at bedtime 7)  Lidoderm 5 % Ptch (Lidocaine) .... Apply 3 patch to skin once a day 8)  Lyrica 150 Mg Caps (Pregabalin) .... Take 1 capsule by mouth three times a day 9)  Nasacort Aq 55 Mcg/act Aers (Triamcinolone acetonide(nasal)) .... Spray 2 spray into both nostrils once a day 10)  Phenazopyridine Plus 150-15-0.3 Mg Tabs (Phenazopyridine-butabarb-hyosc) .... One by mouth  three times a day prn 11)  Sm Calcium/vitamin D 500-200 Mg-unit Tabs (Calcium carbonate-vitamin d) .... Take 1 tablet by mouth three times a day 12)  Xanax 1 Mg Tabs (Alprazolam) .... Take 1 tablet by mouth three times a day 13)  Norvasc 5 Mg Tabs (Amlodipine besylate) .... Once daily 14)  Lamictal 150 Mg Tabs (Lamotrigine) .Marland Kitchen.. 1 two times a day 15)  Zofran 4 Mg Tabs (Ondansetron hcl) .... As needed 16)  Diazepam Powd (Diazepam) 17)  Valium Vag Supp  .... As needed 18)  Amphetamine-dextroamphetamine 30 Mg Xr24h-cap (Amphetamine-dextroamphetamine) .... One by mouth daily in am 19)  Zegerid 40-1100 Mg Caps (Omeprazole-sodium bicarbonate) .... One by mouth daily 20)  Amitiza 24 Mcg Caps (Lubiprostone) .... Two times a day  with food 21)  Cobal-1000 1000 Mcg/ml Inj Soln (Cyanocobalamin) .Marland Kitchen.. 1 ml im q month 22)  Desipramine Hcl 100 Mg Tabs (Desipramine hcl) .Marland Kitchen.. 1-4 by mouth q hs prn 23)  Majic Mouth Wash  .Marland Kitchen.. 10 cc gargle and spit tid 24)  Avinza 90 Mg Xr24h-cap (Morphine sulfate beads) .Marland Kitchen.. 1 once daily 25)  Estring 2 Mg Ring (Estradiol) ....  Use as directed 26)  Lactulose 10 Gm/23ml Soln (Lactulose) .... 30 cc by mouth q 8 hoiurs as needed for constipation hold for loose stools 27)  Baclofen 10 Mg Tabs (Baclofen) .Marland Kitchen.. 1 three times a day 28)  Slow Release Iron 47.5 Mg Cr-tabs (Ferrous sulfate) .... One by mouth daily 29)  Methotrexate 2.5 Mg Tabs (Methotrexate sodium) .... 8 per week 30)  Folic Acid 1 Mg Tabs (Folic acid) .... 2 once daily 31)  Lunesta 3 Mg Tabs (Eszopiclone) .... One by mouth q hs 32)  Estrace 1 Mg Tabs (Estradiol) .Marland Kitchen.. 1 once daily 33)  Medroxyprogesterone Acetate 5 Mg Tabs (Medroxyprogesterone acetate) .Marland Kitchen.. 1 once daily 34)  Morphine Sulfate 15 Mg Tabs (Morphine sulfate) .Marland Kitchen.. 1 every 6-8 hours as needed for break thru pain   Patient Instructions: 1)  Please schedule a follow-up appointment in 1 month. Prescriptions: AMPHETAMINE-DEXTROAMPHETAMINE 30 MG XR24H-CAP (AMPHETAMINE-DEXTROAMPHETAMINE) one by mouth daily in AM  #30 x 0   Entered and Authorized by:   Stacie Glaze MD   Signed by:   Stacie Glaze MD on 03/02/2010   Method used:   Print then Give to Patient   RxID:   [id number]

## 2010-08-24 NOTE — Assessment & Plan Note (Signed)
Summary: 2 month rov./njr   Vital Signs:  Patient profile:   55 year old female Height:      68 inches Weight:      134 pounds BMI:     20.45 Temp:     98.2 degrees F oral Pulse rate:   72 / minute Resp:     14 per minute BP sitting:   120 / 70  (left arm)  Vitals Entered By: Willy Eddy, LPN (May 28, 2010 11:39 AM) CC: roa Is Patient Diabetic? No   Primary Care Provider:  Stacie Glaze MD  CC:  roa.  History of Present Illness: has lost weight and  her medications were adjusted by the  rhematologist the trazadone caused increased somolence in the AM so resumed the lunesta as DOC pain controll stable most days having some left arm  numbness and neck pain had cervical fusion in 2004 with hip graft    every 3-4 days has had PT before. lasting for 3 weeks pain is severe enough for nausea  Preventive Screening-Counseling & Management  Alcohol-Tobacco     Smoking Status: never     Tobacco Counseling: not indicated; no tobacco use  Problems Prior to Update: 1)  Cervical Radiculopathy  (ICD-723.4) 2)  Acute Cystitis  (ICD-595.0) 3)  Neck Pain, Chronic  (ICD-723.1) 4)  Unspecified Synovitis and Tenosynovitis  (ICD-727.00) 5)  Constipation, Drug Induced  (ICD-564.09) 6)  Anemia of Chronic Disease  (ICD-285.29) 7)  Weight Loss, Abnormal  (ICD-783.21) 8)  Physical Examination  (ICD-V70.0) 9)  Family History of Cad Female 1st Degree Relative <50  (ICD-V17.3) 10)  Chest Pain Unspecified  (ICD-786.50) 11)  Cramp in Limb  (ICD-729.82) 12)  Aphthous Ulcers  (ICD-528.2) 13)  Abnormal Weight Gain  (ICD-783.1) 14)  Insomnia, Chronic  (ICD-307.42) 15)  Constipation, Drug Induced  (ICD-564.09) 16)  Abdominal Pain Right Lower Quadrant  (ICD-789.03) 17)  Allergic Rhinitis  (ICD-477.9) 18)  Symptom, Malaise and Fatigue Nec  (ICD-780.79) 19)  Disorders, Orgnc Insomnia D/t Mental Disorder  (ICD-327.02) 20)  Depression  (ICD-311) 21)  Symptom, Malaise and Fatigue Nec   (ICD-780.79) 22)  [name] Syndrome  (ICD-443.0) 23)  Rosacea  (ICD-695.3) 24)  Gerd  (ICD-530.81) 25)  Neuralgia  (ICD-729.2) 26)  Osteoarthritis  (ICD-715.90) 27)  Fibromyalgia, Severe  (ICD-729.1) 28)  Cystitis, Chronic Interstitial  (ICD-595.1) 29)  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)  Current Problems (verified): 1)  Acute Cystitis  (ICD-595.0) 2)  Neck Pain, Chronic  (ICD-723.1) 3)  Unspecified Synovitis and Tenosynovitis  (ICD-727.00) 4)  Constipation, Drug Induced  (ICD-564.09) 5)  Anemia of Chronic Disease  (ICD-285.29) 6)  Weight Loss, Abnormal  (ICD-783.21) 7)  Physical Examination  (ICD-V70.0) 8)  Family History of Cad Female 1st Degree Relative <50  (ICD-V17.3) 9)  Chest Pain Unspecified  (ICD-786.50) 10)  Cramp in Limb  (ICD-729.82) 11)  Aphthous Ulcers  (ICD-528.2) 12)  Abnormal Weight Gain  (ICD-783.1) 13)  Insomnia, Chronic  (ICD-307.42) 14)  Constipation, Drug Induced  (ICD-564.09) 15)  Abdominal Pain Right Lower Quadrant  (ICD-789.03) 16)  Allergic Rhinitis  (ICD-477.9) 17)  Symptom, Malaise and Fatigue Nec  (ICD-780.79) 18)  Disorders, Orgnc Insomnia D/t Mental Disorder  (ICD-327.02) 19)  Depression  (ICD-311) 20)  Symptom, Malaise and Fatigue Nec  (ICD-780.79) 21)  [name] Syndrome  (ICD-443.0) 22)  Rosacea  (ICD-695.3) 23)  Gerd  (ICD-530.81) 24)  Neuralgia  (ICD-729.2) 25)  Osteoarthritis  (ICD-715.90) 26)  Fibromyalgia, Severe  (ICD-729.1)  27)  Cystitis, Chronic Interstitial  (ICD-595.1) 28)  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 Take 1/2 Tablet By Mouth Once A Day 2)  Aspirin 81 Mg Tbec (Aspirin) .... Take 1 Tablet By Mouth Every Morning 3)  D400 400 Unit Caps (Cholecalciferol) .... Take Twice A Day 4)  Elmiron 100 Mg Caps (Pentosan Polysulfate Sodium) .... Take 2 By Mouth Twice A Day 5)  Hydroxychloroquine Sulfate 200 Mg Tabs (Hydroxychloroquine Sulfate) .Marland Kitchen.. 1 Two Times A  Day 6)  Hydroxyzine Hcl 25 Mg Tabs (Hydroxyzine Hcl) .... 2 Tablet By Mouth At Bedtime 7)  Lidoderm 5 % Ptch (Lidocaine) .... Apply 3 Patch To Skin Once A Day 8)  Lyrica 150 Mg Caps (Pregabalin) .... Take 1 Capsule By Mouth Three Times A Day 9)  Nasacort Aq 55 Mcg/act Aers (Triamcinolone Acetonide(Nasal)) .... Spray 2 Spray Into Both Nostrils Once A Day 10)  Phenazopyridine Plus 150-15-0.3 Mg Tabs (Phenazopyridine-Butabarb-Hyosc) .... One By Mouth  Three Times A Day Prn 11)  Sm Calcium/vitamin D 500-200 Mg-Unit Tabs (Calcium Carbonate-Vitamin D) .... Take 1 Tablet By Mouth Three Times A Day 12)  Xanax 1 Mg Tabs (Alprazolam) .... Take 1 Tablet By Mouth Three Times A Day 13)  Norvasc 5 Mg  Tabs (Amlodipine Besylate) .... Once Daily 14)  Lamictal 150 Mg Tabs (Lamotrigine) .Marland Kitchen.. 1 Two Times A Day 15)  Zofran 4 Mg  Tabs (Ondansetron Hcl) .... As Needed 16)  Diazepam   Powd (Diazepam) 17)  Valium Vag Supp .... As Needed 18)  Amphetamine-Dextroamphetamine 30 Mg Xr24h-Cap (Amphetamine-Dextroamphetamine) .... One By Mouth Daily in Am 19)  Zegerid 40-1100 Mg Caps (Omeprazole-Sodium Bicarbonate) .... One By Mouth Daily 20)  Amitiza 24 Mcg  Caps (Lubiprostone) .... Two Times A Day  With Food 21)  Cobal-1000 1000 Mcg/ml Inj Soln (Cyanocobalamin) .Marland Kitchen.. 1 Ml Im Q Month 22)  Desipramine Hcl 100 Mg Tabs (Desipramine Hcl) .Marland Kitchen.. 1-4 By Mouth Q Hs Prn 23)  Majic Mouth Wash .Marland Kitchen.. 10 Cc Gargle and Spit Tid 24)  Avinza 90 Mg Xr24h-Cap (Morphine Sulfate Beads) .Marland Kitchen.. 1 Once Daily 25)  Estring 2 Mg Ring (Estradiol) .... Use As Directed 26)  Lactulose 10 Gm/3ml Soln (Lactulose) .... 30 Cc By Mouth Q 8 Hoiurs As Needed For Constipation Hold For Loose Stools 27)  Baclofen 10 Mg Tabs (Baclofen) .Marland Kitchen.. 1 Three Times A Day 28)  Slow Release Iron 47.5 Mg  Cr-Tabs (Ferrous Sulfate) .... One By Mouth Daily 29)  Methotrexate 2.5 Mg Tabs (Methotrexate Sodium) .... 8 Per Week 30)  Folic Acid 1 Mg Tabs (Folic Acid) .... 2 Once  Daily 31)  Estrace 1 Mg Tabs (Estradiol) .Marland Kitchen.. 1 Once Daily 32)  Medroxyprogesterone Acetate 5 Mg Tabs (Medroxyprogesterone Acetate) .Marland Kitchen.. 1 Once Daily 33)  Morphine Sulfate 15 Mg Tabs (Morphine Sulfate) .Marland Kitchen.. 1 Every 6-8 Hours As Needed For Break Thru Pain 34)  Trazodone Hcl 50 Mg Tabs (Trazodone Hcl) .... 1/2 By Mouth Q Hs  Current Medications (verified): 1)  Acyclovir 800 Mg Tabs (Acyclovir) .... Take 1/2 Tablet By Mouth Once A Day 2)  Aspirin 81 Mg Tbec (Aspirin) .... Take 1 Tablet By Mouth Every Morning 3)  D400 400 Unit Caps (Cholecalciferol) .... Take Twice A Day 4)  Elmiron 100 Mg Caps (Pentosan Polysulfate Sodium) .... Take 2 By Mouth Twice A Day 5)  Hydroxychloroquine Sulfate 200 Mg Tabs (Hydroxychloroquine Sulfate) .Marland Kitchen.. 1 Two Times A Day 6)  Hydroxyzine Hcl  25 Mg Tabs (Hydroxyzine Hcl) .Marland Kitchen.. 1 Tablet By Mouth At Bedtime 7)  Lidoderm 5 % Ptch (Lidocaine) .... Apply 3 Patch To Skin Once A Day 8)  Lyrica 150 Mg Caps (Pregabalin) .... Take 1 Capsule By Mouth Three Times A Day 9)  Nasacort Aq 55 Mcg/act Aers (Triamcinolone Acetonide(Nasal)) .... Spray 2 Spray Into Both Nostrils Once A Day 10)  Phenazopyridine Plus 150-15-0.3 Mg Tabs (Phenazopyridine-Butabarb-Hyosc) .... One By Mouth  Three Times A Day Prn 11)  Sm Calcium/vitamin D 500-200 Mg-Unit Tabs (Calcium Carbonate-Vitamin D) .... Take 1 Tablet By Mouth Three Times A Day 12)  Xanax 1 Mg Tabs (Alprazolam) .... Take 1 Tablet By Mouth Three Times A Day 13)  Norvasc 5 Mg  Tabs (Amlodipine Besylate) .... Once Daily 14)  Lamictal 150 Mg Tabs (Lamotrigine) .Marland Kitchen.. 1 Two Times A Day 15)  Zofran 4 Mg  Tabs (Ondansetron Hcl) .... As Needed 16)  Diazepam   Powd (Diazepam) 17)  Valium Vag Supp .... As Needed 18)  Amphetamine-Dextroamphetamine 30 Mg Xr24h-Cap (Amphetamine-Dextroamphetamine) .... One By Mouth Daily in Am 19)  Zegerid 40-1100 Mg Caps (Omeprazole-Sodium Bicarbonate) .... One By Mouth Daily 20)  Amitiza 24 Mcg  Caps (Lubiprostone) ....  Two Times A Day  With Food 21)  Cobal-1000 1000 Mcg/ml Inj Soln (Cyanocobalamin) .Marland Kitchen.. 1 Ml Im Q Month 22)  Desipramine Hcl 100 Mg Tabs (Desipramine Hcl) .Marland Kitchen.. 1-4 By Mouth Q Hs Prn 23)  Majic Mouth Wash .Marland Kitchen.. 10 Cc Gargle and Spit Tid 24)  Avinza 90 Mg Xr24h-Cap (Morphine Sulfate Beads) .Marland Kitchen.. 1 Once Daily 25)  Estring 2 Mg Ring (Estradiol) .... Use As Directed 26)  Baclofen 10 Mg Tabs (Baclofen) .Marland Kitchen.. 1 Three Times A Day 27)  Slow Release Iron 47.5 Mg  Cr-Tabs (Ferrous Sulfate) .... One By Mouth Daily 28)  Methotrexate 2.5 Mg Tabs (Methotrexate Sodium) .... 8 Per Week 29)  Folic Acid 1 Mg Tabs (Folic Acid) .... 2 Once Daily 30)  Estrace 1 Mg Tabs (Estradiol) .Marland Kitchen.. 1 Once Daily 31)  Medroxyprogesterone Acetate 5 Mg Tabs (Medroxyprogesterone Acetate) .Marland Kitchen.. 1 Once Daily 32)  Morphine Sulfate 15 Mg Tabs (Morphine Sulfate) .Marland Kitchen.. 1 Every 6-8 Hours As Needed For Break Thru Pain 33)  Lunesta 3 Mg Tabs (Eszopiclone) .Marland Kitchen.. 1 At Bedtime  Allergies (verified): 1)  ! Demerol (Meperidine Hcl) 2)  ! * Seritonine Uptake Inhibitors 3)  Codeine Phosphate (Codeine Phosphate) 4)  Sulfamethoxazole (Sulfamethoxazole)  Past History:  Family History: Last updated: 07/28/2008 Family History of CAD Female 1st degree relative <50 father wit esophageal cancer  Social History: Last updated: 04/28/2008 Retired Married Never Smoked Alcohol use-no Drug use-no  Risk Factors: Smoking Status: never (05/28/2010)  Past medical, surgical, family and social histories (including risk factors) reviewed, and no changes noted (except as noted below).  Past Medical History: Reviewed history from 04/28/2008 and no changes required. 277.1/pct ibs/564.1 interstitial cystitis fibromyalgia Osteoarthritis GERD Depression Allergic rhinitis chronic pain  Past Surgical History: Reviewed history from 04/12/2007 and no changes required. Cervical fusion Cervical laminectomy cystoscopy Inguinal herniorrhaphy Sinus  surgery GYN surgery hysteroscopy with ablation  Family History: Reviewed history from 07/28/2008 and no changes required. Family History of CAD Female 1st degree relative <50 father wit esophageal cancer  Social History: Reviewed history from 04/28/2008 and no changes required. Retired Married Never Smoked Alcohol use-no Drug use-no  Review of Systems  The patient denies anorexia, fever, weight loss, weight gain, vision loss, decreased hearing, hoarseness, chest pain, syncope, dyspnea on exertion, peripheral  edema, prolonged cough, headaches, hemoptysis, abdominal pain, melena, hematochezia, severe indigestion/heartburn, hematuria, incontinence, genital sores, muscle weakness, suspicious skin lesions, transient blindness, difficulty walking, depression, unusual weight change, abnormal bleeding, enlarged lymph nodes, angioedema, and breast masses.    Physical Exam  General:  underweight appearing and pale.   Head:  normocephalic and atraumatic.   Eyes:  pupils equal and pupils round.   Ears:  R ear normal and L ear normal.   Nose:  nasal dischargemucosal pallor.   Mouth:  Oral mucosa and oropharynx without lesions or exudates.  Teeth in good repair. Lungs:  Normal respiratory effort, chest expands symmetrically. Lungs are clear to auscultation, no crackles or wheezes. Heart:  Normal rate and regular rhythm. S1 and S2 normal without gallop, click, rub or other extra sounds.Grade   1/6 systolic ejection murmur.   Abdomen:  Bowel sounds positive,abdomen soft and non-tender without masses, organomegaly or hernias noted. Msk:  joint swelling and joint warmth.   Neurologic:  alert & oriented X3, sensation intact to pinprick, gait normal, and finger-to-nose normal.     Impression & Recommendations:  Problem # 1:  CERVICAL RADICULOPATHY (ICD-723.4) hx of fusion at c4 now wiht new onset of neck pain and numbness in left arm 9 drops items and cannot feel heat) pain is sever enough to  cause nausea Orders: Radiology Referral (Radiology) consider referral for Dr Lovell Sheehan is there is disc ruptures  Problem # 2:  FIBROMYALGIA, SEVERE (ICD-729.1) muscle pain Her updated medication list for this problem includes:    Aspirin 81 Mg Tbec (Aspirin) .Marland Kitchen... Take 1 tablet by mouth every morning    Avinza 90 Mg Xr24h-cap (Morphine sulfate beads) .Marland Kitchen... 1 once daily    Baclofen 10 Mg Tabs (Baclofen) .Marland Kitchen... 1 three times a day    Morphine Sulfate 15 Mg Tabs (Morphine sulfate) .Marland Kitchen... 1 every 6-8 hours as needed for break thru pain  Problem # 3:  SYMPTOM, MALAISE AND FATIGUE NEC (ICD-780.79) increased  Complete Medication List: 1)  Acyclovir 800 Mg Tabs (Acyclovir) .... Take 1/2 tablet by mouth once a day 2)  Aspirin 81 Mg Tbec (Aspirin) .... Take 1 tablet by mouth every morning 3)  D400 400 Unit Caps (Cholecalciferol) .... Take twice a day 4)  Elmiron 100 Mg Caps (Pentosan polysulfate sodium) .... Take 2 by mouth twice a day 5)  Hydroxychloroquine Sulfate 200 Mg Tabs (Hydroxychloroquine sulfate) .Marland Kitchen.. 1 two times a day 6)  Hydroxyzine Hcl 25 Mg Tabs (Hydroxyzine hcl) .Marland Kitchen.. 1 tablet by mouth at bedtime 7)  Lidoderm 5 % Ptch (Lidocaine) .... Apply 3 patch to skin once a day 8)  Lyrica 150 Mg Caps (Pregabalin) .... Take 1 capsule by mouth three times a day 9)  Nasacort Aq 55 Mcg/act Aers (Triamcinolone acetonide(nasal)) .... Spray 2 spray into both nostrils once a day 10)  Phenazopyridine Plus 150-15-0.3 Mg Tabs (Phenazopyridine-butabarb-hyosc) .... One by mouth  three times a day prn 11)  Sm Calcium/vitamin D 500-200 Mg-unit Tabs (Calcium carbonate-vitamin d) .... Take 1 tablet by mouth three times a day 12)  Xanax 1 Mg Tabs (Alprazolam) .... Take 1 tablet by mouth three times a day 13)  Norvasc 5 Mg Tabs (Amlodipine besylate) .... Once daily 14)  Lamictal 150 Mg Tabs (Lamotrigine) .Marland Kitchen.. 1 two times a day 15)  Zofran 4 Mg Tabs (Ondansetron hcl) .... As needed 16)  Diazepam Powd  (Diazepam) 17)  Valium Vag Supp  .... As needed 18)  Amphetamine-dextroamphetamine 30 Mg Xr24h-cap (Amphetamine-dextroamphetamine) .... One  by mouth daily in am 19)  Zegerid 40-1100 Mg Caps (Omeprazole-sodium bicarbonate) .... One by mouth daily 20)  Amitiza 24 Mcg Caps (Lubiprostone) .... Two times a day  with food 21)  Cobal-1000 1000 Mcg/ml Inj Soln (Cyanocobalamin) .Marland Kitchen.. 1 ml im q month 22)  Desipramine Hcl 100 Mg Tabs (Desipramine hcl) .Marland Kitchen.. 1-4 by mouth q hs prn 23)  Majic Mouth Wash  .Marland Kitchen.. 10 cc gargle and spit tid 24)  Avinza 90 Mg Xr24h-cap (Morphine sulfate beads) .Marland Kitchen.. 1 once daily 25)  Estring 2 Mg Ring (Estradiol) .... Use as directed 26)  Baclofen 10 Mg Tabs (Baclofen) .Marland Kitchen.. 1 three times a day 27)  Slow Release Iron 47.5 Mg Cr-tabs (Ferrous sulfate) .... One by mouth daily 28)  Methotrexate 2.5 Mg Tabs (Methotrexate sodium) .... 8 per week 29)  Folic Acid 1 Mg Tabs (Folic acid) .... 2 once daily 30)  Estrace 1 Mg Tabs (Estradiol) .Marland Kitchen.. 1 once daily 31)  Medroxyprogesterone Acetate 5 Mg Tabs (Medroxyprogesterone acetate) .Marland Kitchen.. 1 once daily 32)  Morphine Sulfate 15 Mg Tabs (Morphine sulfate) .Marland Kitchen.. 1 every 6-8 hours as needed for break thru pain 33)  Lunesta 3 Mg Tabs (Eszopiclone) .Marland Kitchen.. 1 at bedtime  Other Orders: Neurosurgeon Referral (Neurosurgeon)  Patient Instructions: 1)  Please schedule a follow-up appointment in 2 months. Prescriptions: AMPHETAMINE-DEXTROAMPHETAMINE 30 MG XR24H-CAP (AMPHETAMINE-DEXTROAMPHETAMINE) one by mouth daily in AM  #30 x 0   Entered by:   Willy Eddy, LPN   Authorized by:   Stacie Glaze MD   Signed by:   Willy Eddy, LPN on 62/13/0865   Method used:   Print then Give to Patient   RxID:   [id number] AMPHETAMINE-DEXTROAMPHETAMINE 30 MG XR24H-CAP (AMPHETAMINE-DEXTROAMPHETAMINE) one by mouth daily in AM  #30 x 0   Entered by:   Willy Eddy, LPN   Authorized by:   Stacie Glaze MD   Signed by:   Willy Eddy, LPN on  40/04/2724   Method used:   Print then Give to Patient   RxID:   [id number] AMPHETAMINE-DEXTROAMPHETAMINE 30 MG XR24H-CAP (AMPHETAMINE-DEXTROAMPHETAMINE) one by mouth daily in AM  #30 x 0   Entered by:   Willy Eddy, LPN   Authorized by:   Stacie Glaze MD   Signed by:   Willy Eddy, LPN on 87/56/4332   Method used:   Print then Give to Patient   RxID:   [id number] LUNESTA 3 MG TABS (ESZOPICLONE) 1 at bedtime  #30 x 3   Entered by:   Willy Eddy, LPN   Authorized by:   Stacie Glaze MD   Signed by:   Willy Eddy, LPN on 10/93/2355   Method used:   Print then Give to Patient   RxID:   [id number]    Orders Added: 1)  Est. Patient Level IV [28315] 2)  Radiology Referral [Radiology] 3)  Neurosurgeon Referral [Neurosurgeon]

## 2010-08-24 NOTE — Progress Notes (Signed)
  Phone Note Call from Patient   Reason for Call: Acute Illness, Refill Medication, Talk to Doctor Summary of Call: left message on machine c/o severe pain primarily in ankles and hips and lower extremities since decreasing methotrxate.  DOesnt think cyclospoine is helping at all and is requesting refill on morphine 15mg  ir for break thru pain  Initial call taken by: Willy Eddy, LPN,  January 23, 9380 1:28 PM  Follow-up for Phone Call        may refill the ir a nd have her call rheumatololgisdt to discuss increasing morphine. left message on machine for pt  may refill the ir Follow-up by: Stacie Glaze MD,  January 22, 2010 2:11 PM    New/Updated Medications: MS CONTIN 15 MG XR12H-TAB (MORPHINE SULFATE) 1 every 6-8 hours as needed break through pain MORPHINE SULFATE 15 MG TABS (MORPHINE SULFATE) 1 every 6-8 hours as needed for break thru pain Prescriptions: MORPHINE SULFATE 15 MG TABS (MORPHINE SULFATE) 1 every 6-8 hours as needed for break thru pain  #30 x 0   Entered by:   Willy Eddy, LPN   Authorized by:   Stacie Glaze MD   Signed by:   Willy Eddy, LPN on 82/99/3716   Method used:   Print then Give to Patient   RxID:   [id number] MS CONTIN 15 MG XR12H-TAB (MORPHINE SULFATE) 1 every 6-8 hours as needed break through pain  #30 x 0   Entered by:   Willy Eddy, LPN   Authorized by:   Stacie Glaze MD   Signed by:   Willy Eddy, LPN on 85/27/7824   Method used:   Print then Give to Patient   RxID:   [id number]

## 2010-08-24 NOTE — Assessment & Plan Note (Signed)
Summary: 3 month rov/njr---PT [facility] // RS--PT [facility] (2ND TIME) // RS   Vital Signs:  Patient profile:   55 year old female Height:      68 inches Weight:      124 pounds BMI:     18.92 Temp:     98.2 degrees F oral Pulse rate:   72 / minute Resp:     14 per minute BP sitting:   110 / 70  (left arm)  Vitals Entered By: Willy Eddy, LPN (December 30, 979 11:06 AM) CC: roa   CC:  roa.  History of Present Illness: review of the blood work and the consult for IC she is on cyclosorin for IC as a trial for intersticial cystitis and fibromyalgia her fibromyalgia  has improved and he IC has improved some she started May 10 he is monitering the blood work the pain levels still fluxuate, with some calming of the bladder she feels very positive  Preventive Screening-Counseling & Management  Alcohol-Tobacco     Smoking Status: never  Problems Prior to Update: 1)  Acute Cystitis  (ICD-595.0) 2)  Neck Pain, Chronic  (ICD-723.1) 3)  Unspecified Synovitis and Tenosynovitis  (ICD-727.00) 4)  Constipation, Drug Induced  (ICD-564.09) 5)  Anemia of Chronic Disease  (ICD-285.29) 6)  Weight Loss, Abnormal  (ICD-783.21) 7)  Physical Examination  (ICD-V70.0) 8)  Family History of Cad Female 1st Degree Relative <50  (ICD-V17.3) 9)  Chest Pain Unspecified  (ICD-786.50) 10)  Cramp in Limb  (ICD-729.82) 11)  Aphthous Ulcers  (ICD-528.2) 12)  Abnormal Weight Gain  (ICD-783.1) 13)  Insomnia, Chronic  (ICD-307.42) 14)  Constipation, Drug Induced  (ICD-564.09) 15)  Abdominal Pain Right Lower Quadrant  (ICD-789.03) 16)  Allergic Rhinitis  (ICD-477.9) 17)  Symptom, Malaise and Fatigue Nec  (ICD-780.79) 18)  Disorders, Orgnc Insomnia D/t Mental Disorder  (ICD-327.02) 19)  Depression  (ICD-311) 20)  Symptom, Malaise and Fatigue Nec  (ICD-780.79) 21)  [name] Syndrome  (ICD-443.0) 22)  Rosacea  (ICD-695.3) 23)  Gerd  (ICD-530.81) 24)  Neuralgia  (ICD-729.2) 25)  Osteoarthritis  (ICD-715.90) 26)   Fibromyalgia, Severe  (ICD-729.1) 27)  Cystitis, Chronic Interstitial  (ICD-595.1) 28)  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)  Current Problems (verified): 1)  Acute Cystitis  (ICD-595.0) 2)  Neck Pain, Chronic  (ICD-723.1) 3)  Unspecified Synovitis and Tenosynovitis  (ICD-727.00) 4)  Constipation, Drug Induced  (ICD-564.09) 5)  Anemia of Chronic Disease  (ICD-285.29) 6)  Weight Loss, Abnormal  (ICD-783.21) 7)  Physical Examination  (ICD-V70.0) 8)  Family History of Cad Female 1st Degree Relative <50  (ICD-V17.3) 9)  Chest Pain Unspecified  (ICD-786.50) 10)  Cramp in Limb  (ICD-729.82) 11)  Aphthous Ulcers  (ICD-528.2) 12)  Abnormal Weight Gain  (ICD-783.1) 13)  Insomnia, Chronic  (ICD-307.42) 14)  Constipation, Drug Induced  (ICD-564.09) 15)  Abdominal Pain Right Lower Quadrant  (ICD-789.03) 16)  Allergic Rhinitis  (ICD-477.9) 17)  Symptom, Malaise and Fatigue Nec  (ICD-780.79) 18)  Disorders, Orgnc Insomnia D/t Mental Disorder  (ICD-327.02) 19)  Depression  (ICD-311) 20)  Symptom, Malaise and Fatigue Nec  (ICD-780.79) 21)  [name] Syndrome  (ICD-443.0) 22)  Rosacea  (ICD-695.3) 23)  Gerd  (ICD-530.81) 24)  Neuralgia  (ICD-729.2) 25)  Osteoarthritis  (ICD-715.90) 26)  Fibromyalgia, Severe  (ICD-729.1) 27)  Cystitis, Chronic Interstitial  (ICD-595.1) 28)  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 Take 1/2 Tablet By  Mouth Once A Day 2)  Aspirin 81 Mg Tbec (Aspirin) .... Take 1 Tablet By Mouth Every Morning 3)  D400 400 Unit Caps (Cholecalciferol) .... Take Twice A Day 4)  Elmiron 100 Mg Caps (Pentosan Polysulfate Sodium) .... Take 2 By Mouth Twice A Day 5)  Hydroxychloroquine Sulfate 200 Mg Tabs (Hydroxychloroquine Sulfate) .Marland Kitchen.. 1 Tablet By Mouth Once A Day 6)  Hydroxyzine Hcl 25 Mg Tabs (Hydroxyzine Hcl) .... 2 Tablet By Mouth At Bedtime 7)  Lidoderm 5 % Ptch (Lidocaine) .... Apply 3 Patch To Skin Once A  Day 8)  Lyrica 150 Mg Caps (Pregabalin) .... Take 1 Capsule By Mouth Three Times A Day 9)  Nasacort Aq 55 Mcg/act Aers (Triamcinolone Acetonide(Nasal)) .... Spray 2 Spray Into Both Nostrils Once A Day 10)  Phenazopyridine Plus 150-15-0.3 Mg Tabs (Phenazopyridine-Butabarb-Hyosc) .... One By Mouth  Three Times A Day Prn 11)  Sm Calcium/vitamin D 500-200 Mg-Unit Tabs (Calcium Carbonate-Vitamin D) .... Take 1 Tablet By Mouth Three Times A Day 12)  Xanax 1 Mg Tabs (Alprazolam) .... Take 1 Tablet By Mouth Three Times A Day 13)  Norvasc 5 Mg  Tabs (Amlodipine Besylate) .... Once Daily 14)  Risperidone 0.5 Mg Tbdp (Risperidone) .... One By Mouth Q Hs 15)  Lamictal 150 Mg Tabs (Lamotrigine) .Marland Kitchen.. 1 Two Times A Day 16)  Zofran 4 Mg  Tabs (Ondansetron Hcl) .... As Needed 17)  Diazepam   Powd (Diazepam) 18)  Valium Vag Supp .... As Needed 19)  Ritalin Sr 20 Mg  Tbcr (Methylphenidate Hcl) .... One By Mouth Daily in Am 20)  Zegerid 40-1100 Mg Caps (Omeprazole-Sodium Bicarbonate) .... One By Mouth Daily 21)  Amitiza 24 Mcg  Caps (Lubiprostone) .... Two Times A Day  With Food 22)  Cobal-1000 1000 Mcg/ml Inj Soln (Cyanocobalamin) .Marland Kitchen.. 1 Ml Im Q Month 23)  Desipramine Hcl 100 Mg Tabs (Desipramine Hcl) .Marland Kitchen.. 1-4 By Mouth Q Hs Prn 24)  Majic Mouth Wash .Marland Kitchen.. 10 Cc Gargle and Spit Tid 25)  Prempro 0.625-2.5 Mg Tabs (Conj Estrog-Medroxyprogest Ace) .... Use As Directed 26)  Avinza 90 Mg Xr24h-Cap (Morphine Sulfate Beads) .... One By Mouth Daily 27)  Estring 2 Mg Ring (Estradiol) .... Use As Directed 28)  Lactulose 10 Gm/97ml Soln (Lactulose) .... 30 Cc By Mouth Q 8 Hoiurs As Needed For Constipation Hold For Loose Stools 29)  Baclofen 10 Mg Tabs (Baclofen) .Marland Kitchen.. 1 Three Times A Day 30)  Slow Release Iron 47.5 Mg  Cr-Tabs (Ferrous Sulfate) .... One By Mouth Daily 31)  Methotrexate 2.5 Mg Tabs (Methotrexate Sodium) .... 8 Tabs Weekly 32)  Folic Acid 1 Mg Tabs (Folic Acid) .... 2 Once Daily 33)  Lunesta 3 Mg Tabs  (Eszopiclone) .... One By Mouth Q Hs 34)  Estrace 1 Mg Tabs (Estradiol) .Marland Kitchen.. 1 Once Daily 35)  Cipro 250 Mg Tabs (Ciprofloxacin Hcl) .Marland Kitchen.. 1 Two Times A Day 10 Days 36)  Zithromax Z-Pak 250 Mg Tabs (Azithromycin) .... As Directed  Current Medications (verified): 1)  Acyclovir 800 Mg Tabs (Acyclovir) .... Take 1/2 Tablet By Mouth Once A Day 2)  Aspirin 81 Mg Tbec (Aspirin) .... Take 1 Tablet By Mouth Every Morning 3)  D400 400 Unit Caps (Cholecalciferol) .... Take Twice A Day 4)  Elmiron 100 Mg Caps (Pentosan Polysulfate Sodium) .... Take 2 By Mouth Twice A Day 5)  Hydroxychloroquine Sulfate 200 Mg Tabs (Hydroxychloroquine Sulfate) .Marland Kitchen.. 1 Two Times A Day 6)  Hydroxyzine Hcl 25 Mg Tabs (Hydroxyzine Hcl) .... 2  Tablet By Mouth At Bedtime 7)  Lidoderm 5 % Ptch (Lidocaine) .... Apply 3 Patch To Skin Once A Day 8)  Lyrica 150 Mg Caps (Pregabalin) .... Take 1 Capsule By Mouth Three Times A Day 9)  Nasacort Aq 55 Mcg/act Aers (Triamcinolone Acetonide(Nasal)) .... Spray 2 Spray Into Both Nostrils Once A Day 10)  Phenazopyridine Plus 150-15-0.3 Mg Tabs (Phenazopyridine-Butabarb-Hyosc) .... One By Mouth  Three Times A Day Prn 11)  Sm Calcium/vitamin D 500-200 Mg-Unit Tabs (Calcium Carbonate-Vitamin D) .... Take 1 Tablet By Mouth Three Times A Day 12)  Xanax 1 Mg Tabs (Alprazolam) .... Take 1 Tablet By Mouth Three Times A Day 13)  Norvasc 5 Mg  Tabs (Amlodipine Besylate) .... Once Daily 14)  Lamictal 150 Mg Tabs (Lamotrigine) .Marland Kitchen.. 1 Two Times A Day 15)  Zofran 4 Mg  Tabs (Ondansetron Hcl) .... As Needed 16)  Diazepam   Powd (Diazepam) 17)  Valium Vag Supp .... As Needed 18)  Ritalin Sr 20 Mg  Tbcr (Methylphenidate Hcl) .... One By Mouth Daily in Am 19)  Zegerid 40-1100 Mg Caps (Omeprazole-Sodium Bicarbonate) .... One By Mouth Daily 20)  Amitiza 24 Mcg  Caps (Lubiprostone) .... Two Times A Day  With Food 21)  Cobal-1000 1000 Mcg/ml Inj Soln (Cyanocobalamin) .Marland Kitchen.. 1 Ml Im Q Month 22)  Desipramine Hcl 100  Mg Tabs (Desipramine Hcl) .Marland Kitchen.. 1-4 By Mouth Q Hs Prn 23)  Majic Mouth Wash .Marland Kitchen.. 10 Cc Gargle and Spit Tid 24)  Avinza 90 Mg Xr24h-Cap (Morphine Sulfate Beads) .... One By Mouth Daily 25)  Estring 2 Mg Ring (Estradiol) .... Use As Directed 26)  Lactulose 10 Gm/32ml Soln (Lactulose) .... 30 Cc By Mouth Q 8 Hoiurs As Needed For Constipation Hold For Loose Stools 27)  Baclofen 10 Mg Tabs (Baclofen) .Marland Kitchen.. 1 Three Times A Day 28)  Slow Release Iron 47.5 Mg  Cr-Tabs (Ferrous Sulfate) .... One By Mouth Daily 29)  Methotrexate 2.5 Mg Tabs (Methotrexate Sodium) .... 8 Tabs Weekly 30)  Folic Acid 1 Mg Tabs (Folic Acid) .... 2 Once Daily 31)  Lunesta 3 Mg Tabs (Eszopiclone) .... One By Mouth Q Hs 32)  Estrace 1 Mg Tabs (Estradiol) .Marland Kitchen.. 1 Once Daily 33)  Medroxyprogesterone Acetate 5 Mg Tabs (Medroxyprogesterone Acetate) .Marland Kitchen.. 1 Once Daily 34)  Cyclosporine 25 Mg Caps (Cyclosporine) .... 3 Two Times A Day  Allergies (verified): 1)  ! Demerol (Meperidine Hcl) 2)  ! * Seritonine Uptake Inhibitors 3)  Codeine Phosphate (Codeine Phosphate) 4)  Sulfamethoxazole (Sulfamethoxazole)  Past History:  Family History: Last updated: 07/28/2008 Family History of CAD Female 1st degree relative <50 father wit esophageal cancer  Social History: Last updated: 04/28/2008 Retired Married Never Smoked Alcohol use-no Drug use-no  Risk Factors: Smoking Status: never (12/29/2009)  Past medical, surgical, family and social histories (including risk factors) reviewed, and no changes noted (except as noted below).  Past Medical History: Reviewed history from 04/28/2008 and no changes required. 277.1/pct ibs/564.1 interstitial cystitis fibromyalgia Osteoarthritis GERD Depression Allergic rhinitis chronic pain  Past Surgical History: Reviewed history from 04/12/2007 and no changes required. Cervical fusion Cervical laminectomy cystoscopy Inguinal herniorrhaphy Sinus surgery GYN surgery hysteroscopy  with ablation  Family History: Reviewed history from 07/28/2008 and no changes required. Family History of CAD Female 1st degree relative <50 father wit esophageal cancer  Social History: Reviewed history from 04/28/2008 and no changes required. Retired Married Never Smoked Alcohol use-no Drug use-no  Review of Systems  The patient denies anorexia, fever, weight  loss, weight gain, vision loss, decreased hearing, hoarseness, chest pain, syncope, dyspnea on exertion, peripheral edema, prolonged cough, headaches, hemoptysis, abdominal pain, melena, hematochezia, severe indigestion/heartburn, hematuria, incontinence, genital sores, muscle weakness, suspicious skin lesions, transient blindness, difficulty walking, depression, unusual weight change, abnormal bleeding, enlarged lymph nodes, angioedema, and breast masses.    Physical Exam  General:  underweight appearing and pale.   Head:  normocephalic and atraumatic.   Eyes:  pupils equal and pupils round.   Ears:  R ear normal and L ear normal.   Nose:  nasal dischargemucosal pallor.   Mouth:  Oral mucosa and oropharynx without lesions or exudates.  Teeth in good repair. Neck:  No deformities, masses, muslce tenderness notes Lungs:  Normal respiratory effort, chest expands symmetrically. Lungs are clear to auscultation, no crackles or wheezes. Heart:  Normal rate and regular rhythm. S1 and S2 normal without gallop, click, rub or other extra sounds.Grade   1/6 systolic ejection murmur.   Abdomen:  Bowel sounds positive,abdomen soft and non-tender without masses, organomegaly or hernias noted. Extremities:  No clubbing, cyanosis, edema, or deformity noted with normal full range of motion of all joints.   Neurologic:  alert & oriented X3, sensation intact to pinprick, gait normal, and finger-to-nose normal.     Impression & Recommendations:  Problem # 1:  FIBROMYALGIA, SEVERE (ICD-729.1)  Her updated medication list for this problem  includes:    Aspirin 81 Mg Tbec (Aspirin) .Marland Kitchen... Take 1 tablet by mouth every morning    Avinza 75 Mg Xr24h-cap (Morphine sulfate beads)    Baclofen 10 Mg Tabs (Baclofen) .Marland Kitchen... 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 Take 1/2 tablet by mouth once a day 2)  Aspirin 81 Mg Tbec (Aspirin) .... Take 1 tablet by mouth every morning 3)  D400 400 Unit Caps (Cholecalciferol) .... Take twice a day 4)  Elmiron 100 Mg Caps (Pentosan polysulfate sodium) .... Take 2 by mouth twice a day 5)  Hydroxychloroquine Sulfate 200 Mg Tabs (Hydroxychloroquine sulfate) .Marland Kitchen.. 1 two times a day 6)  Hydroxyzine Hcl 25 Mg Tabs (Hydroxyzine hcl) .... 2 tablet by mouth at bedtime 7)  Lidoderm 5 % Ptch (Lidocaine) .... Apply 3 patch to skin once a day 8)  Lyrica 150 Mg Caps (Pregabalin) .... Take 1 capsule by mouth three times a day 9)  Nasacort Aq 55 Mcg/act Aers (Triamcinolone acetonide(nasal)) .... Spray 2 spray into both nostrils once a day 10)  Phenazopyridine Plus 150-15-0.3 Mg Tabs (Phenazopyridine-butabarb-hyosc) .... One by mouth  three times a day prn 11)  Sm Calcium/vitamin D 500-200 Mg-unit Tabs (Calcium carbonate-vitamin d) .... Take 1 tablet by mouth three times a day 12)  Xanax 1 Mg Tabs (Alprazolam) .... Take 1 tablet by mouth three times a day 13)  Norvasc 5 Mg Tabs (Amlodipine besylate) .... Once daily 14)  Lamictal 150 Mg Tabs (Lamotrigine) .Marland Kitchen.. 1 two times a day 15)  Zofran 4 Mg Tabs (Ondansetron hcl) .... As needed 16)  Diazepam Powd (Diazepam) 17)  Valium Vag Supp  .... As needed 18)  Ritalin Sr 20 Mg Tbcr (Methylphenidate hcl) .... One by mouth daily in am 19)  Zegerid 40-1100 Mg Caps (Omeprazole-sodium bicarbonate) .... One by mouth  daily  20)  Amitiza 24 Mcg Caps (Lubiprostone) .... Two times a day  with food 21)  Cobal-1000 1000 Mcg/ml Inj Soln (Cyanocobalamin) .Marland Kitchen.. 1 ml im q month 22)  Desipramine Hcl 100 Mg Tabs (Desipramine hcl) .Marland Kitchen.. 1-4 by mouth q hs prn 23)  Majic Mouth Wash  .Marland Kitchen.. 10 cc gargle and spit tid 24)  Avinza 75 Mg Xr24h-cap (Morphine sulfate beads) 25)  Estring 2 Mg Ring (Estradiol) .... Use as directed 26)  Lactulose 10 Gm/8ml Soln (Lactulose) .... 30 cc by mouth q 8 hoiurs as needed for constipation hold for loose stools 27)  Baclofen 10 Mg Tabs (Baclofen) .Marland Kitchen.. 1 three times a day 28)  Slow Release Iron 47.5 Mg Cr-tabs (Ferrous sulfate) .... One by mouth daily 29)  Methotrexate 2.5 Mg Tabs (Methotrexate sodium) .... 7 by mouth week, the 6 by mouth weekly the 5 by mouth weekly the 4 by mouth weekly the 3 by mouth weekly the stop 30)  Folic Acid 1 Mg Tabs (Folic acid) .... 2 once daily 31)  Lunesta 3 Mg Tabs (Eszopiclone) .... One by mouth q hs 32)  Estrace 1 Mg Tabs (Estradiol) .Marland Kitchen.. 1 once daily 33)  Medroxyprogesterone Acetate 5 Mg Tabs (Medroxyprogesterone acetate) .Marland Kitchen.. 1 once daily 34)  Cyclosporine 25 Mg Caps (Cyclosporine) .... 3 two times a day  Patient Instructions: 1)  Please schedule a follow-up appointment in 6 weeks. Prescriptions: AVINZA 75 MG XR24H-CAP (MORPHINE SULFATE BEADS)   #30 x 0   Entered and Authorized by:   Stacie Glaze MD   Signed by:   Stacie Glaze MD on 12/29/2009   Method used:   Print then Give to Patient   RxID:   [id number]

## 2010-08-24 NOTE — Letter (Signed)
Summary: [facility]  [facility]   Imported By: Maryln Gottron 03/25/2010 09:38:47  _____________________________________________________________________  External Attachment:    Type:   Image     Comment:   External Document

## 2010-08-24 NOTE — Progress Notes (Signed)
Summary: REQ FOR DOCUMENTATION OF FLU SHOT  Phone Note Call from Patient   Caller: Patient  [phone] Summary of Call: Pt called to request some type of documentation stating that she has had her flu shot at [facility] due to the fact that she is volunteering at [facility]..... Pt asked if documentation can be mailed to her at her home address (states she is getting ready to have surgery and won't be able to drive)  [address], [place], [place]  [address].... Pt can be reached at [phone] with any questions or concerns.  Initial call taken by: Debbra Riding,  April 19, 2010 4:30 PM  Follow-up for Phone Call        done Follow-up by: Willy Eddy, LPN,  April 19, 2010 5:05 PM

## 2010-08-24 NOTE — Progress Notes (Signed)
Summary: [facility]  Phone Note Call from Patient Call back at Home Phone [phone]   Caller: Patient Call For: Stacie Glaze MD Summary of Call: pt needs new rx fax to [facility] [phone] of zegerid 40 mg #90 with 3 refills Initial call taken by: Heron Sabins,  January 28, 2010 1:21 PM    Prescriptions: ZEGERID 40-1100 MG CAPS (OMEPRAZOLE-SODIUM BICARBONATE) one by mouth daily  #90 Capsule x 3   Entered by:   Kern Reap CMA (AAMA)   Authorized by:   Stacie Glaze MD   Signed by:   Kern Reap CMA (AAMA) on 01/28/2010   Method used:   Re-Faxed to ...       [facility] (mail-order)             , [place]         Ph: [phone]       Fax: [phone]   RxID:   [id number]

## 2010-08-24 NOTE — Letter (Signed)
Summary: [facility]  [facility]   Imported By: Maryln Gottron 12/14/2009 11:25:52  _____________________________________________________________________  External Attachment:    Type:   Image     Comment:   External Document

## 2010-08-24 NOTE — Letter (Signed)
Summary: [facility]  [facility]   Imported By: Maryln Gottron 12/11/2009 14:09:53  _____________________________________________________________________  External Attachment:    Type:   Image     Comment:   External Document

## 2010-08-24 NOTE — Letter (Signed)
Summary: [facility]  [facility]   Imported By: Maryln Gottron 04/20/2010 13:06:44  _____________________________________________________________________  External Attachment:    Type:   Image     Comment:   External Document

## 2010-08-24 NOTE — Progress Notes (Signed)
Summary: please advise  Phone Note Call from Patient Call back at Home Phone [phone]   Caller: Patient----triage vm Reason for Call: Acute Illness Complaint: Nausea/Vomiting/Diarrhea Summary of Call: c/o virus, which started on Monday @ 11 am, with explosive diarrhea which lasted every 40 minutes all through Tuesday, sometimes every 4 hours. Wents through Immodium with no relief. no other sxs...Marland Kitchenno fever, she feels weak with pain in her legs. please advise.    [facility] Initial call taken by: Warnell Forester,  June 23, 2010 8:44 AM  Follow-up for Phone Call        clear liquids for 48 hours- propell or gator aid- if not alot better in 24 hours call back-continue with imodium Follow-up by: Willy Eddy, LPN,  June 23, 2010 10:06 AM

## 2010-08-24 NOTE — Assessment & Plan Note (Signed)
Summary: 1 month rov/njr   Vital Signs:  Patient profile:   55 year old female Height:      68 inches Weight:      133 pounds BMI:     20.30 Temp:     98.2 degrees F oral Pulse rate:   72 / minute Resp:     14 per minute BP sitting:   110 / 70  (left arm)  Vitals Entered By: Willy Eddy, LPN (July 30, 2009 10:29 AM) CC: roa   CC:  roa.  History of Present Illness: incresaed baldder pain with pelvic floor pain form the IC in the warm pool and exercize has just resumed the rhematologist resumed the plaquinil with the MTX vs go to  [place], [place]? the pain med use is worrying her, her nephew was recently diagnozed as a substance abuser she never asks for early refills of "looses" she has a flair of her fatigue and fibromyalgia  Preventive Screening-Counseling & Management  Alcohol-Tobacco     Smoking Status: never  Problems Prior to Update: 1)  Unspecified Synovitis and Tenosynovitis  (ICD-727.00) 2)  Constipation, Drug Induced  (ICD-564.09) 3)  Anemia of Chronic Disease  (ICD-285.29) 4)  Weight Loss, Abnormal  (ICD-783.21) 5)  Physical Examination  (ICD-V70.0) 6)  Family History of Cad Female 1st Degree Relative <50  (ICD-V17.3) 7)  Chest Pain Unspecified  (ICD-786.50) 8)  Cramp in Limb  (ICD-729.82) 9)  Aphthous Ulcers  (ICD-528.2) 10)  Abnormal Weight Gain  (ICD-783.1) 11)  Insomnia, Chronic  (ICD-307.42) 12)  Constipation, Drug Induced  (ICD-564.09) 13)  Abdominal Pain Right Lower Quadrant  (ICD-789.03) 14)  Allergic Rhinitis  (ICD-477.9) 15)  Symptom, Malaise and Fatigue Nec  (ICD-780.79) 16)  Disorders, Orgnc Insomnia D/t Mental Disorder  (ICD-327.02) 17)  Depression  (ICD-311) 18)  Symptom, Malaise and Fatigue Nec  (ICD-780.79) 19)  [name] Syndrome  (ICD-443.0) 20)  Rosacea  (ICD-695.3) 21)  Gerd  (ICD-530.81) 22)  Neuralgia  (ICD-729.2) 23)  Osteoarthritis  (ICD-715.90) 24)  Fibromyalgia, Severe  (ICD-729.1) 25)  Cystitis, Chronic Interstitial   (ICD-595.1) 26)  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 Take 1/2 Tablet By Mouth Once A Day 2)  Aspirin 81 Mg Tbec (Aspirin) .... Take 1 Tablet By Mouth Every Morning 3)  D400 400 Unit Caps (Cholecalciferol) .... Take Twice A Day 4)  Elmiron 100 Mg Caps (Pentosan Polysulfate Sodium) .... Take 2 By Mouth Twice A Day 5)  Hydroxychloroquine Sulfate 200 Mg Tabs (Hydroxychloroquine Sulfate) .Marland Kitchen.. 1 Tablet By Mouth Once A Day 6)  Hydroxyzine Hcl 25 Mg Tabs (Hydroxyzine Hcl) .... 2 Tablet By Mouth At Bedtime 7)  Lidoderm 5 % Ptch (Lidocaine) .... Apply 3 Patch To Skin Once A Day 8)  Lyrica 150 Mg Caps (Pregabalin) .... Take 1 Capsule By Mouth Three Times A Day 9)  Nasacort Aq 55 Mcg/act Aers (Triamcinolone Acetonide(Nasal)) .... Spray 2 Spray Into Both Nostrils Once A Day 10)  Phenazopyridine Plus 150-15-0.3 Mg Tabs (Phenazopyridine-Butabarb-Hyosc) .... One By Mouth  Three Times A Day Prn 11)  Sm Calcium/vitamin D 500-200 Mg-Unit Tabs (Calcium Carbonate-Vitamin D) .... Take 1 Tablet By Mouth Three Times A Day 12)  Xanax 1 Mg Tabs (Alprazolam) .... Take 1 Tablet By Mouth Three Times A Day 13)  Norvasc 5 Mg  Tabs (Amlodipine Besylate) .... Once Daily 14)  Risperidone 0.5 Mg Tbdp (Risperidone) .... One By Mouth Q Hs 15)  Lamictal 150 Mg Tabs (Lamotrigine) .Marland Kitchen.. 1 Two Times A Day 16)  Zofran 4 Mg  Tabs (Ondansetron Hcl) .... As Needed 17)  Diazepam   Powd (Diazepam) 18)  Valium Vag Supp .... As Needed 19)  Ritalin Sr 20 Mg  Tbcr (Methylphenidate Hcl) .... One By Mouth Daily in Am 20)  Zegerid 40-1100 Mg Caps (Omeprazole-Sodium Bicarbonate) .... One By Mouth Daily 21)  Amitiza 24 Mcg  Caps (Lubiprostone) .... Two Times A Day  With Food 22)  Cobal-1000 1000 Mcg/ml Inj Soln (Cyanocobalamin) .Marland Kitchen.. 1 Ml Im Q Month 23)  Desipramine Hcl 100 Mg Tabs (Desipramine Hcl) .Marland Kitchen.. 1-4 By Mouth Q Hs Prn 24)  Majic Mouth Wash .Marland Kitchen.. 10 Cc Gargle and Spit  Tid 25)  Prempro 0.625-2.5 Mg Tabs (Conj Estrog-Medroxyprogest Ace) .... Use As Directed 26)  Avinza 90 Mg Xr24h-Cap (Morphine Sulfate Beads) .... One By Mouth Daily 27)  Estring 2 Mg Ring (Estradiol) .... Use As Directed 28)  Lactulose 10 Gm/27ml Soln (Lactulose) .... 30 Cc By Mouth Q 8 Hoiurs As Needed For Constipation Hold For Loose Stools 29)  Baclofen 10 Mg Tabs (Baclofen) .Marland Kitchen.. 1 Three Times A Day 30)  Slow Release Iron 47.5 Mg  Cr-Tabs (Ferrous Sulfate) .... One By Mouth Daily 31)  Methotrexate 2.5 Mg Tabs (Methotrexate Sodium) .... 8 Tabs Weekly 32)  Folic Acid 1 Mg Tabs (Folic Acid) .... 2 Once Daily 33)  Lunesta 3 Mg Tabs (Eszopiclone) .... One By Mouth Q Hs  Current Medications (verified): 1)  Acyclovir 800 Mg Tabs (Acyclovir) .... Take 1/2 Tablet By Mouth Once A Day 2)  Aspirin 81 Mg Tbec (Aspirin) .... Take 1 Tablet By Mouth Every Morning 3)  D400 400 Unit Caps (Cholecalciferol) .... Take Twice A Day 4)  Elmiron 100 Mg Caps (Pentosan Polysulfate Sodium) .... Take 2 By Mouth Twice A Day 5)  Hydroxychloroquine Sulfate 200 Mg Tabs (Hydroxychloroquine Sulfate) .Marland Kitchen.. 1 Tablet By Mouth Once A Day 6)  Hydroxyzine Hcl 25 Mg Tabs (Hydroxyzine Hcl) .... 2 Tablet By Mouth At Bedtime 7)  Lidoderm 5 % Ptch (Lidocaine) .... Apply 3 Patch To Skin Once A Day 8)  Lyrica 150 Mg Caps (Pregabalin) .... Take 1 Capsule By Mouth Three Times A Day 9)  Nasacort Aq 55 Mcg/act Aers (Triamcinolone Acetonide(Nasal)) .... Spray 2 Spray Into Both Nostrils Once A Day 10)  Phenazopyridine Plus 150-15-0.3 Mg Tabs (Phenazopyridine-Butabarb-Hyosc) .... One By Mouth  Three Times A Day Prn 11)  Sm Calcium/vitamin D 500-200 Mg-Unit Tabs (Calcium Carbonate-Vitamin D) .... Take 1 Tablet By Mouth Three Times A Day 12)  Xanax 1 Mg Tabs (Alprazolam) .... Take 1 Tablet By Mouth Three Times A Day 13)  Norvasc 5 Mg  Tabs (Amlodipine Besylate) .... Once Daily 14)  Risperidone 0.5 Mg Tbdp (Risperidone) .... One By Mouth Q  Hs 15)  Lamictal 150 Mg Tabs (Lamotrigine) .Marland Kitchen.. 1 Two Times A Day 16)  Zofran 4 Mg  Tabs (Ondansetron Hcl) .... As Needed 17)  Diazepam   Powd (Diazepam) 18)  Valium Vag Supp .... As Needed 19)  Ritalin Sr 20 Mg  Tbcr (Methylphenidate Hcl) .... One By Mouth Daily in Am 20)  Zegerid 40-1100 Mg Caps (Omeprazole-Sodium Bicarbonate) .... One By Mouth Daily 21)  Amitiza 24 Mcg  Caps (Lubiprostone) .... Two Times A Day  With Food 22)  Cobal-1000 1000 Mcg/ml Inj Soln (Cyanocobalamin) .Marland Kitchen.. 1 Ml Im Q Month 23)  Desipramine Hcl 100 Mg Tabs (Desipramine Hcl) .Marland Kitchen.. 1-4 By Mouth Q  Hs Prn 24)  Majic Mouth Wash .Marland Kitchen.. 10 Cc Gargle and Spit Tid 25)  Prempro 0.625-2.5 Mg Tabs (Conj Estrog-Medroxyprogest Ace) .... Use As Directed 26)  Avinza 90 Mg Xr24h-Cap (Morphine Sulfate Beads) .... One By Mouth Daily 27)  Estring 2 Mg Ring (Estradiol) .... Use As Directed 28)  Lactulose 10 Gm/66ml Soln (Lactulose) .... 30 Cc By Mouth Q 8 Hoiurs As Needed For Constipation Hold For Loose Stools 29)  Baclofen 10 Mg Tabs (Baclofen) .Marland Kitchen.. 1 Three Times A Day 30)  Slow Release Iron 47.5 Mg  Cr-Tabs (Ferrous Sulfate) .... One By Mouth Daily 31)  Methotrexate 2.5 Mg Tabs (Methotrexate Sodium) .... 8 Tabs Weekly 32)  Folic Acid 1 Mg Tabs (Folic Acid) .... 2 Once Daily 33)  Lunesta 3 Mg Tabs (Eszopiclone) .... One By Mouth Q Hs 34)  Estrace 1 Mg Tabs (Estradiol) .Marland Kitchen.. 1 Once Daily  Allergies (verified): 1)  ! Demerol (Meperidine Hcl) 2)  ! * Seritonine Uptake Inhibitors 3)  Codeine Phosphate (Codeine Phosphate) 4)  Sulfamethoxazole (Sulfamethoxazole)  Past History:  Family History: Last updated: 07/28/2008 Family History of CAD Female 1st degree relative <50 father wit esophageal cancer  Social History: Last updated: 04/28/2008 Retired Married Never Smoked Alcohol use-no Drug use-no  Risk Factors: Smoking Status: never (07/30/2009)  Past medical, surgical, family and social histories (including risk factors)  reviewed, and no changes noted (except as noted below).  Past Medical History: Reviewed history from 04/28/2008 and no changes required. 277.1/pct ibs/564.1 interstitial cystitis fibromyalgia Osteoarthritis GERD Depression Allergic rhinitis chronic pain  Past Surgical History: Reviewed history from 04/12/2007 and no changes required. Cervical fusion Cervical laminectomy cystoscopy Inguinal herniorrhaphy Sinus surgery GYN surgery hysteroscopy with ablation  Family History: Reviewed history from 07/28/2008 and no changes required. Family History of CAD Female 1st degree relative <50 father wit esophageal cancer  Social History: Reviewed history from 04/28/2008 and no changes required. Retired Married Never Smoked Alcohol use-no Drug use-no  Review of Systems  The patient denies anorexia, fever, weight loss, weight gain, vision loss, decreased hearing, hoarseness, chest pain, syncope, dyspnea on exertion, peripheral edema, prolonged cough, headaches, hemoptysis, abdominal pain, melena, hematochezia, severe indigestion/heartburn, hematuria, incontinence, genital sores, muscle weakness, suspicious skin lesions, transient blindness, difficulty walking, depression, unusual weight change, abnormal bleeding, enlarged lymph nodes, angioedema, and breast masses.    Physical Exam  General:  underweight appearing and pale.   Head:  normocephalic and atraumatic.   Eyes:  pupils equal and pupils round.   Ears:  R ear normal and L ear normal.   Nose:  nasal dischargemucosal pallor.   Mouth:  Oral mucosa and oropharynx without lesions or exudates.  Teeth in good repair. Neck:  No deformities, masses, muslce tenderness notes Lungs:  Normal respiratory effort, chest expands symmetrically. Lungs are clear to auscultation, no crackles or wheezes. Heart:  Normal rate and regular rhythm. S1 and S2 normal without gallop, click, rub or other extra sounds.Grade   1/6 systolic ejection murmur.    Abdomen:  Bowel sounds positive,abdomen soft and non-tender without masses, organomegaly or hernias noted.   Impression & Recommendations:  Problem # 1:  SYMPTOM, MALAISE AND FATIGUE NEC (ICD-780.79) fibromyalgia  Problem # 2:  CYSTITIS, CHRONIC INTERSTITIAL (ICD-595.1) worseingn symptoms  Problem # 3:  FIBROMYALGIA, SEVERE (ICD-729.1)  Her updated medication list for this problem includes:    Aspirin 81 Mg Tbec (Aspirin) .Marland Kitchen... Take 1 tablet by mouth every morning    Avinza 90 Mg Xr24h-cap (Morphine sulfate beads) .Marland KitchenMarland KitchenMarland KitchenMarland Kitchen  One by mouth daily    Baclofen 10 Mg Tabs (Baclofen) .Marland Kitchen... 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   Complete Medication List: 1)  Acyclovir 800 Mg Tabs (Acyclovir) .... Take 1/2 tablet by mouth once a day 2)  Aspirin 81 Mg Tbec (Aspirin) .... Take 1 tablet by mouth every morning 3)  D400 400 Unit Caps (Cholecalciferol) .... Take twice a day 4)  Elmiron 100 Mg Caps (Pentosan polysulfate sodium) .... Take 2 by mouth twice a day 5)  Hydroxychloroquine Sulfate 200 Mg Tabs (Hydroxychloroquine sulfate) .Marland Kitchen.. 1 tablet by mouth once a day 6)  Hydroxyzine Hcl 25 Mg Tabs (Hydroxyzine hcl) .... 2 tablet by mouth at bedtime 7)  Lidoderm 5 % Ptch (Lidocaine) .... Apply 3 patch to skin once a day 8)  Lyrica 150 Mg Caps (Pregabalin) .... Take 1 capsule by mouth three times a day 9)  Nasacort Aq 55 Mcg/act Aers (Triamcinolone acetonide(nasal)) .... Spray 2 spray into both nostrils once a day 10)  Phenazopyridine Plus 150-15-0.3 Mg Tabs (Phenazopyridine-butabarb-hyosc) .... One by mouth  three times a day prn 11)  Sm Calcium/vitamin D  500-200 Mg-unit Tabs (Calcium carbonate-vitamin d) .... Take 1 tablet by mouth three times a day 12)  Xanax 1 Mg Tabs (Alprazolam) .... Take 1 tablet by mouth three times a day 13)  Norvasc 5 Mg Tabs (Amlodipine besylate) .... Once daily 14)  Risperidone 0.5 Mg Tbdp (Risperidone) .... One by mouth q hs 15)  Lamictal 150 Mg Tabs (Lamotrigine) .Marland Kitchen.. 1 two times a day 16)  Zofran 4 Mg Tabs (Ondansetron hcl) .... As needed 17)  Diazepam Powd (Diazepam) 18)  Valium Vag Supp  .... As needed 19)  Ritalin Sr 20 Mg Tbcr (Methylphenidate hcl) .... One by mouth daily in am 20)  Zegerid 40-1100 Mg Caps (Omeprazole-sodium bicarbonate) .... One by mouth daily 21)  Amitiza 24 Mcg Caps (Lubiprostone) .... Two times a day  with food 22)  Cobal-1000 1000 Mcg/ml Inj Soln (Cyanocobalamin) .Marland Kitchen.. 1 ml im q month 23)  Desipramine Hcl 100 Mg Tabs (Desipramine hcl) .Marland Kitchen.. 1-4 by mouth q hs prn 24)  Majic Mouth Wash  .Marland Kitchen.. 10 cc gargle and spit tid 25)  Prempro 0.625-2.5 Mg Tabs (Conj estrog-medroxyprogest ace) .... Use as directed 26)  Avinza 90 Mg Xr24h-cap (Morphine sulfate beads) .... One by mouth daily 27)  Estring 2 Mg Ring (Estradiol) .... Use as directed 28)  Lactulose 10 Gm/63ml Soln (Lactulose) .... 30 cc by mouth q 8 hoiurs as needed for constipation hold for loose stools 29)  Baclofen 10 Mg Tabs (Baclofen) .Marland Kitchen.. 1 three times a day 30)  Slow Release Iron 47.5 Mg Cr-tabs (Ferrous sulfate) .... One by mouth daily 31)  Methotrexate 2.5 Mg Tabs (Methotrexate sodium) .... 8 tabs weekly 32)  Folic Acid 1 Mg Tabs (Folic acid) .... 2 once daily 33)  Lunesta 3 Mg Tabs (Eszopiclone) .... One by mouth q hs 34)  Estrace 1 Mg Tabs (Estradiol) .Marland Kitchen.. 1 once daily  Other Orders: Pneumococcal Vaccine (62952) Admin 1st Vaccine (84132)   Immunizations Administered:  Pneumonia Vaccine:    Vaccine Type: Pneumovax  Site: left deltoid    Mfr: Merck    Dose: 0.5 ml    Route: IM    Given by: Willy Eddy, LPN     Exp. Date: 07/10/2010    Lot #: [id number]

## 2010-08-24 NOTE — Letter (Signed)
Summary: [facility]  [facility]   Imported By: Maryln Gottron 09/10/2009 13:49:10  _____________________________________________________________________  External Attachment:    Type:   Image     Comment:   External Document

## 2010-08-24 NOTE — Letter (Signed)
Summary: [facility]  [facility]   Imported By: Maryln Gottron 11/25/2009 13:00:08  _____________________________________________________________________  External Attachment:    Type:   Image     Comment:   External Document

## 2010-08-24 NOTE — Assessment & Plan Note (Signed)
Summary: 2 month rov/njr   Vital Signs:  Patient profile:   55 year old female Height:      68 inches Weight:      120 pounds BMI:     18.31 Temp:     98.2 degrees F oral Pulse rate:   72 / minute Resp:     14 per minute BP sitting:   110 / 70  (left arm)  Vitals Entered By: Willy Eddy, LPN (October 01, 2009 11:39 AM) CC: roa   CC:  roa.  History of Present Illness: The pt has been doing the instilations for IC fibromyalgia pain flair considering breast augmentation after weight loss weight loss has stabilitzed neck pain is mid neck without radiculopaty rated 7/10  Preventive Screening-Counseling & Management  Alcohol-Tobacco     Smoking Status: never  Problems Prior to Update: 1)  Unspecified Synovitis and Tenosynovitis  (ICD-727.00) 2)  Constipation, Drug Induced  (ICD-564.09) 3)  Anemia of Chronic Disease  (ICD-285.29) 4)  Weight Loss, Abnormal  (ICD-783.21) 5)  Physical Examination  (ICD-V70.0) 6)  Family History of Cad Female 1st Degree Relative <50  (ICD-V17.3) 7)  Chest Pain Unspecified  (ICD-786.50) 8)  Cramp in Limb  (ICD-729.82) 9)  Aphthous Ulcers  (ICD-528.2) 10)  Abnormal Weight Gain  (ICD-783.1) 11)  Insomnia, Chronic  (ICD-307.42) 12)  Constipation, Drug Induced  (ICD-564.09) 13)  Abdominal Pain Right Lower Quadrant  (ICD-789.03) 14)  Allergic Rhinitis  (ICD-477.9) 15)  Symptom, Malaise and Fatigue Nec  (ICD-780.79) 16)  Disorders, Orgnc Insomnia D/t Mental Disorder  (ICD-327.02) 17)  Depression  (ICD-311) 18)  Symptom, Malaise and Fatigue Nec  (ICD-780.79) 19)  [name] Syndrome  (ICD-443.0) 20)  Rosacea  (ICD-695.3) 21)  Gerd  (ICD-530.81) 22)  Neuralgia  (ICD-729.2) 23)  Osteoarthritis  (ICD-715.90) 24)  Fibromyalgia, Severe  (ICD-729.1) 25)  Cystitis, Chronic Interstitial  (ICD-595.1) 26)  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)  Current Problems (verified): 1)  Unspecified Synovitis and Tenosynovitis  (ICD-727.00) 2)  Constipation,  Drug Induced  (ICD-564.09) 3)  Anemia of Chronic Disease  (ICD-285.29) 4)  Weight Loss, Abnormal  (ICD-783.21) 5)  Physical Examination  (ICD-V70.0) 6)  Family History of Cad Female 1st Degree Relative <50  (ICD-V17.3) 7)  Chest Pain Unspecified  (ICD-786.50) 8)  Cramp in Limb  (ICD-729.82) 9)  Aphthous Ulcers  (ICD-528.2) 10)  Abnormal Weight Gain  (ICD-783.1) 11)  Insomnia, Chronic  (ICD-307.42) 12)  Constipation, Drug Induced  (ICD-564.09) 13)  Abdominal Pain Right Lower Quadrant  (ICD-789.03) 14)  Allergic Rhinitis  (ICD-477.9) 15)  Symptom, Malaise and Fatigue Nec  (ICD-780.79) 16)  Disorders, Orgnc Insomnia D/t Mental Disorder  (ICD-327.02) 17)  Depression  (ICD-311) 18)  Symptom, Malaise and Fatigue Nec  (ICD-780.79) 19)  [name] Syndrome  (ICD-443.0) 20)  Rosacea  (ICD-695.3) 21)  Gerd  (ICD-530.81) 22)  Neuralgia  (ICD-729.2) 23)  Osteoarthritis  (ICD-715.90) 24)  Fibromyalgia, Severe  (ICD-729.1) 25)  Cystitis, Chronic Interstitial  (ICD-595.1) 26)  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 Take 1/2 Tablet By Mouth Once A Day 2)  Aspirin 81 Mg Tbec (Aspirin) .... Take 1 Tablet By Mouth Every Morning 3)  D400 400 Unit Caps (Cholecalciferol) .... Take Twice A Day 4)  Elmiron 100 Mg Caps (Pentosan Polysulfate Sodium) .... Take 2 By Mouth Twice A Day 5)  Hydroxychloroquine Sulfate 200 Mg Tabs (Hydroxychloroquine Sulfate) .Marland Kitchen.. 1 Tablet By Mouth Once A  Day 6)  Hydroxyzine Hcl 25 Mg Tabs (Hydroxyzine Hcl) .... 2 Tablet By Mouth At Bedtime 7)  Lidoderm 5 % Ptch (Lidocaine) .... Apply 3 Patch To Skin Once A Day 8)  Lyrica 150 Mg Caps (Pregabalin) .... Take 1 Capsule By Mouth Three Times A Day 9)  Nasacort Aq 55 Mcg/act Aers (Triamcinolone Acetonide(Nasal)) .... Spray 2 Spray Into Both Nostrils Once A Day 10)  Phenazopyridine Plus 150-15-0.3 Mg Tabs (Phenazopyridine-Butabarb-Hyosc) .... One By Mouth  Three Times A  Day Prn 11)  Sm Calcium/vitamin D 500-200 Mg-Unit Tabs (Calcium Carbonate-Vitamin D) .... Take 1 Tablet By Mouth Three Times A Day 12)  Xanax 1 Mg Tabs (Alprazolam) .... Take 1 Tablet By Mouth Three Times A Day 13)  Norvasc 5 Mg  Tabs (Amlodipine Besylate) .... Once Daily 14)  Risperidone 0.5 Mg Tbdp (Risperidone) .... One By Mouth Q Hs 15)  Lamictal 150 Mg Tabs (Lamotrigine) .Marland Kitchen.. 1 Two Times A Day 16)  Zofran 4 Mg  Tabs (Ondansetron Hcl) .... As Needed 17)  Diazepam   Powd (Diazepam) 18)  Valium Vag Supp .... As Needed 19)  Ritalin Sr 20 Mg  Tbcr (Methylphenidate Hcl) .... One By Mouth Daily in Am 20)  Zegerid 40-1100 Mg Caps (Omeprazole-Sodium Bicarbonate) .... One By Mouth Daily 21)  Amitiza 24 Mcg  Caps (Lubiprostone) .... Two Times A Day  With Food 22)  Cobal-1000 1000 Mcg/ml Inj Soln (Cyanocobalamin) .Marland Kitchen.. 1 Ml Im Q Month 23)  Desipramine Hcl 100 Mg Tabs (Desipramine Hcl) .Marland Kitchen.. 1-4 By Mouth Q Hs Prn 24)  Majic Mouth Wash .Marland Kitchen.. 10 Cc Gargle and Spit Tid 25)  Prempro 0.625-2.5 Mg Tabs (Conj Estrog-Medroxyprogest Ace) .... Use As Directed 26)  Avinza 90 Mg Xr24h-Cap (Morphine Sulfate Beads) .... One By Mouth Daily 27)  Estring 2 Mg Ring (Estradiol) .... Use As Directed 28)  Lactulose 10 Gm/51ml Soln (Lactulose) .... 30 Cc By Mouth Q 8 Hoiurs As Needed For Constipation Hold For Loose Stools 29)  Baclofen 10 Mg Tabs (Baclofen) .Marland Kitchen.. 1 Three Times A Day 30)  Slow Release Iron 47.5 Mg  Cr-Tabs (Ferrous Sulfate) .... One By Mouth Daily 31)  Methotrexate 2.5 Mg Tabs (Methotrexate Sodium) .... 8 Tabs Weekly 32)  Folic Acid 1 Mg Tabs (Folic Acid) .... 2 Once Daily 33)  Lunesta 3 Mg Tabs (Eszopiclone) .... One By Mouth Q Hs 34)  Estrace 1 Mg Tabs (Estradiol) .Marland Kitchen.. 1 Once Daily  Current Medications (verified): 1)  Acyclovir 800 Mg Tabs (Acyclovir) .... Take 1/2 Tablet By Mouth Once A Day 2)  Aspirin 81 Mg Tbec (Aspirin) .... Take 1 Tablet By Mouth Every Morning 3)  D400 400 Unit Caps  (Cholecalciferol) .... Take Twice A Day 4)  Elmiron 100 Mg Caps (Pentosan Polysulfate Sodium) .... Take 2 By Mouth Twice A Day 5)  Hydroxychloroquine Sulfate 200 Mg Tabs (Hydroxychloroquine Sulfate) .Marland Kitchen.. 1 Tablet By Mouth Once A Day 6)  Hydroxyzine Hcl 25 Mg Tabs (Hydroxyzine Hcl) .... 2 Tablet By Mouth At Bedtime 7)  Lidoderm 5 % Ptch (Lidocaine) .... Apply 3 Patch To Skin Once A Day 8)  Lyrica 150 Mg Caps (Pregabalin) .... Take 1 Capsule By Mouth Three Times A Day 9)  Nasacort Aq 55 Mcg/act Aers (Triamcinolone Acetonide(Nasal)) .... Spray 2 Spray Into Both Nostrils Once A Day 10)  Phenazopyridine Plus 150-15-0.3 Mg Tabs (Phenazopyridine-Butabarb-Hyosc) .... One By Mouth  Three Times A Day Prn 11)  Sm Calcium/vitamin D 500-200 Mg-Unit Tabs (Calcium Carbonate-Vitamin D) .... Take 1  Tablet By Mouth Three Times A Day 12)  Xanax 1 Mg Tabs (Alprazolam) .... Take 1 Tablet By Mouth Three Times A Day 13)  Norvasc 5 Mg  Tabs (Amlodipine Besylate) .... Once Daily 14)  Risperidone 0.5 Mg Tbdp (Risperidone) .... One By Mouth Q Hs 15)  Lamictal 150 Mg Tabs (Lamotrigine) .Marland Kitchen.. 1 Two Times A Day 16)  Zofran 4 Mg  Tabs (Ondansetron Hcl) .... As Needed 17)  Diazepam   Powd (Diazepam) 18)  Valium Vag Supp .... As Needed 19)  Ritalin Sr 20 Mg  Tbcr (Methylphenidate Hcl) .... One By Mouth Daily in Am 20)  Zegerid 40-1100 Mg Caps (Omeprazole-Sodium Bicarbonate) .... One By Mouth Daily 21)  Amitiza 24 Mcg  Caps (Lubiprostone) .... Two Times A Day  With Food 22)  Cobal-1000 1000 Mcg/ml Inj Soln (Cyanocobalamin) .Marland Kitchen.. 1 Ml Im Q Month 23)  Desipramine Hcl 100 Mg Tabs (Desipramine Hcl) .Marland Kitchen.. 1-4 By Mouth Q Hs Prn 24)  Majic Mouth Wash .Marland Kitchen.. 10 Cc Gargle and Spit Tid 25)  Prempro 0.625-2.5 Mg Tabs (Conj Estrog-Medroxyprogest Ace) .... Use As Directed 26)  Avinza 90 Mg Xr24h-Cap (Morphine Sulfate Beads) .... One By Mouth Daily 27)  Estring 2 Mg Ring (Estradiol) .... Use As Directed 28)  Lactulose 10 Gm/48ml Soln  (Lactulose) .... 30 Cc By Mouth Q 8 Hoiurs As Needed For Constipation Hold For Loose Stools 29)  Baclofen 10 Mg Tabs (Baclofen) .Marland Kitchen.. 1 Three Times A Day 30)  Slow Release Iron 47.5 Mg  Cr-Tabs (Ferrous Sulfate) .... One By Mouth Daily 31)  Methotrexate 2.5 Mg Tabs (Methotrexate Sodium) .... 8 Tabs Weekly 32)  Folic Acid 1 Mg Tabs (Folic Acid) .... 2 Once Daily 33)  Lunesta 3 Mg Tabs (Eszopiclone) .... One By Mouth Q Hs 34)  Estrace 1 Mg Tabs (Estradiol) .Marland Kitchen.. 1 Once Daily  Allergies (verified): 1)  ! Demerol (Meperidine Hcl) 2)  ! * Seritonine Uptake Inhibitors 3)  Codeine Phosphate (Codeine Phosphate) 4)  Sulfamethoxazole (Sulfamethoxazole)  Past History:  Family History: Last updated: 07/28/2008 Family History of CAD Female 1st degree relative <50 father wit esophageal cancer  Social History: Last updated: 04/28/2008 Retired Married Never Smoked Alcohol use-no Drug use-no  Risk Factors: Smoking Status: never (10/01/2009)  Past medical, surgical, family and social histories (including risk factors) reviewed, and no changes noted (except as noted below).  Past Medical History: Reviewed history from 04/28/2008 and no changes required. 277.1/pct ibs/564.1 interstitial cystitis fibromyalgia Osteoarthritis GERD Depression Allergic rhinitis chronic pain  Past Surgical History: Reviewed history from 04/12/2007 and no changes required. Cervical fusion Cervical laminectomy cystoscopy Inguinal herniorrhaphy Sinus surgery GYN surgery hysteroscopy with ablation  Family History: Reviewed history from 07/28/2008 and no changes required. Family History of CAD Female 1st degree relative <50 father wit esophageal cancer  Social History: Reviewed history from 04/28/2008 and no changes required. Retired Married Never Smoked Alcohol use-no Drug use-no  Review of Systems  The patient denies anorexia, fever, weight loss, weight gain, vision loss, decreased hearing,  hoarseness, chest pain, syncope, dyspnea on exertion, peripheral edema, prolonged cough, headaches, hemoptysis, abdominal pain, melena, hematochezia, severe indigestion/heartburn, hematuria, incontinence, genital sores, muscle weakness, suspicious skin lesions, transient blindness, difficulty walking, depression, unusual weight change, abnormal bleeding, enlarged lymph nodes, angioedema, and breast masses.    Physical Exam  General:  underweight appearing and pale.   Head:  normocephalic and atraumatic.   Eyes:  pupils equal and pupils round.   Ears:  R ear normal and L  ear normal.   Nose:  nasal dischargemucosal pallor.   Mouth:  Oral mucosa and oropharynx without lesions or exudates.  Teeth in good repair. Neck:  No deformities, masses, muslce tenderness notes Lungs:  Normal respiratory effort, chest expands symmetrically. Lungs are clear to auscultation, no crackles or wheezes. Heart:  Normal rate and regular rhythm. S1 and S2 normal without gallop, click, rub or other extra sounds.Grade   1/6 systolic ejection murmur.     Impression & Recommendations:  Problem # 1:  FIBROMYALGIA, SEVERE (ICD-729.1) increased neck pain at site of the prior surgery Her updated medication list for this problem includes:    Aspirin 81 Mg Tbec (Aspirin) .Marland Kitchen... Take 1 tablet by mouth every morning    Avinza 90 Mg Xr24h-cap (Morphine sulfate beads) ..... One by mouth daily    Baclofen 10 Mg Tabs (Baclofen) .Marland Kitchen... 1 three times a day  Problem # 2:  CYSTITIS, CHRONIC INTERSTITIAL (ICD-595.1) reveiwed  the recomnedations of Dr Logan Bores  Problem # 3:  NECK PAIN, CHRONIC (ICD-723.1)  chriopracter first then epidural if fails Her updated medication list for this problem includes:    Aspirin 81 Mg Tbec (Aspirin) .Marland Kitchen... Take 1 tablet by mouth every morning    Avinza 90 Mg Xr24h-cap (Morphine sulfate beads) ..... One by mouth daily    Baclofen 10 Mg Tabs (Baclofen) .Marland Kitchen... 1 three times a day  Discussed exercises and  use of moist heat or cold and medication.   Problem # 4:  ABNORMAL WEIGHT GAIN (ICD-783.1) weight loss and loss of breast tissue  Complete Medication List: 1)  Acyclovir 800 Mg Tabs (Acyclovir) .... Take 1/2 tablet by mouth once a day 2)  Aspirin 81 Mg Tbec (Aspirin) .... Take 1 tablet by mouth every morning 3)  D400 400 Unit Caps (Cholecalciferol) .... Take twice a day 4)  Elmiron 100 Mg Caps (Pentosan polysulfate sodium) .... Take 2 by mouth twice a day 5)  Hydroxychloroquine Sulfate 200 Mg Tabs (Hydroxychloroquine sulfate) .Marland Kitchen.. 1 tablet by mouth once a day 6)  Hydroxyzine Hcl 25 Mg Tabs (Hydroxyzine hcl) .... 2 tablet by mouth at bedtime 7)  Lidoderm 5 % Ptch (Lidocaine) .... Apply 3 patch to skin once a day 8)  Lyrica 150 Mg Caps (Pregabalin) .... Take 1 capsule by mouth three times a day 9)  Nasacort Aq 55 Mcg/act Aers (Triamcinolone acetonide(nasal)) .... Spray 2 spray into both nostrils once a day 10)  Phenazopyridine Plus 150-15-0.3 Mg Tabs (Phenazopyridine-butabarb-hyosc) .... One by mouth  three times a day prn 11)  Sm Calcium/vitamin D 500-200 Mg-unit Tabs (Calcium carbonate-vitamin d) .... Take 1 tablet by mouth three times a day 12)  Xanax 1 Mg Tabs (Alprazolam) .... Take 1 tablet by mouth three times a day 13)  Norvasc 5 Mg Tabs (Amlodipine besylate) .... Once daily 14)  Risperidone 0.5 Mg Tbdp (Risperidone) .... One by mouth q hs 15)  Lamictal 150 Mg Tabs (Lamotrigine) .Marland Kitchen.. 1 two times a day 16)  Zofran 4 Mg Tabs (Ondansetron hcl) .... As needed 17)  Diazepam Powd (Diazepam) 18)  Valium Vag Supp  .... As needed 19)  Ritalin Sr 20 Mg Tbcr (Methylphenidate hcl) .... One by mouth daily in am 20)  Zegerid 40-1100 Mg Caps (Omeprazole-sodium bicarbonate) .... One by mouth daily 21)  Amitiza 24 Mcg Caps (Lubiprostone) .... Two times a day  with food 22)  Cobal-1000 1000 Mcg/ml Inj Soln (Cyanocobalamin) .Marland Kitchen.. 1 ml im q month 23)  Desipramine Hcl 100 Mg Tabs (  Desipramine hcl) .Marland Kitchen..  1-4 by mouth q hs prn 24)  Majic Mouth Wash  .Marland Kitchen.. 10 cc gargle and spit tid 25)  Prempro 0.625-2.5 Mg Tabs (Conj estrog-medroxyprogest ace) .... Use as directed 26)  Avinza 90 Mg Xr24h-cap (Morphine sulfate beads) .... One by mouth daily 27)  Estring 2 Mg Ring (Estradiol) .... Use as directed 28)  Lactulose 10 Gm/44ml Soln (Lactulose) .... 30 cc by mouth q 8 hoiurs as needed for constipation hold for loose stools 29)  Baclofen 10 Mg Tabs (Baclofen) .Marland Kitchen.. 1 three times a day 30)  Slow Release Iron 47.5 Mg Cr-tabs (Ferrous sulfate) .... One by mouth daily 31)  Methotrexate 2.5 Mg Tabs (Methotrexate sodium) .... 8 tabs weekly 32)  Folic Acid 1 Mg Tabs (Folic acid) .... 2 once daily 33)  Lunesta 3 Mg Tabs (Eszopiclone) .... One by mouth q hs 34)  Estrace 1 Mg Tabs (Estradiol) .Marland Kitchen.. 1 once daily  Patient Instructions: 1)  Please schedule a follow-up appointment in 3 months.

## 2010-08-24 NOTE — Letter (Signed)
Summary: Generic Letter  [facility]  [address] [place], [place] [address]   Phone: [phone]  Fax: [phone]    04/19/2010  [name] [address] [place], [place]  [address]    MS.Sizemore had 2011 flu vaccine.       Sincerely,   Melchor Amour, LPN

## 2010-08-24 NOTE — Progress Notes (Signed)
Summary: Rx Refill  Phone Note Refill Request Call back at Home Phone [phone] Message from:  Patient  Refills Requested: Medication #1:  AVINZA 90 MG XR24H-CAP 1 once daily  Method Requested: Pick up at Office Initial call taken by: Trixie Dredge,  June 14, 2010 8:54 AM    Prescriptions: AMITIZA 24 MCG  CAPS (LUBIPROSTONE) two times a day  with food  #60 Capsule x 8   Entered by:   Willy Eddy, LPN   Authorized by:   Stacie Glaze MD   Signed by:   Willy Eddy, LPN on 09/81/1914   Method used:   Print then Give to Patient   RxID:   [id number] AMITIZA 24 MCG  CAPS (LUBIPROSTONE) two times a day  with food  #60 Capsule x 8   Entered by:   Willy Eddy, LPN   Authorized by:   Stacie Glaze MD   Signed by:   Willy Eddy, LPN on 69/62/9528   Method used:   Print then Give to Patient   RxID:   [id number]

## 2010-08-24 NOTE — Letter (Signed)
Summary: [facility]  [facility]   Imported By: Maryln Gottron 08/10/2009 09:42:38  _____________________________________________________________________  External Attachment:    Type:   Image     Comment:   External Document

## 2010-08-24 NOTE — Assessment & Plan Note (Signed)
Summary: UTI/PS   History of Present Illness: dysuria for 2 days  Allergies: 1)  ! Demerol (Meperidine Hcl) 2)  ! * Seritonine Uptake Inhibitors 3)  Codeine Phosphate (Codeine Phosphate) 4)  Sulfamethoxazole (Sulfamethoxazole)  Physical Exam  General:  underweight appearing and pale.     Impression & Recommendations:  Problem # 1:  ACUTE CYSTITIS (ICD-595.0)  Her updated medication list for this problem includes:    Phenazopyridine Plus 150-15-0.3 Mg Tabs (Phenazopyridine-butabarb-hyosc) ..... One by mouth  three times a day prn    Cipro 250 Mg Tabs (Ciprofloxacin hcl) .Marland Kitchen... 1 two times a day 10 days  Encouraged to push clear liquids, get enough rest, and take acetaminophen as needed. To be seen in 10 days if no improvement, sooner if worse.  Orders: T-Culture, Urine ([id number])  Complete Medication List: 1)  Acyclovir 800 Mg Tabs (Acyclovir) .... Take 1/2 tablet by mouth once a day 2)  Aspirin 81 Mg Tbec (Aspirin) .... Take 1 tablet by mouth every morning 3)  D400 400 Unit Caps (Cholecalciferol) .... Take twice a day 4)  Elmiron 100 Mg Caps (Pentosan polysulfate sodium) .... Take 2 by mouth twice a day 5)  Hydroxychloroquine Sulfate 200 Mg Tabs (Hydroxychloroquine sulfate) .Marland Kitchen.. 1 tablet by mouth once a day 6)  Hydroxyzine Hcl 25 Mg Tabs (Hydroxyzine hcl) .... 2 tablet by mouth at bedtime 7)  Lidoderm 5 % Ptch (Lidocaine) .... Apply 3 patch to skin once a day 8)  Lyrica 150 Mg Caps (Pregabalin) .... Take 1 capsule by mouth three times a day 9)  Nasacort Aq 55 Mcg/act Aers (Triamcinolone acetonide(nasal)) .... Spray 2 spray into both nostrils once a day 10)  Phenazopyridine Plus 150-15-0.3 Mg Tabs (Phenazopyridine-butabarb-hyosc) .... One by mouth  three times a day prn 11)  Sm Calcium/vitamin D 500-200 Mg-unit Tabs (Calcium carbonate-vitamin d) .... Take 1 tablet by mouth three times a day 12)  Xanax 1 Mg Tabs (Alprazolam) .... Take 1 tablet by mouth three times a day 13)   Norvasc 5 Mg Tabs (Amlodipine besylate) .... Once daily 14)  Risperidone 0.5 Mg Tbdp (Risperidone) .... One by mouth q hs 15)  Lamictal 150 Mg Tabs (Lamotrigine) .Marland Kitchen.. 1 two times a day 16)  Zofran 4 Mg Tabs (Ondansetron hcl) .... As needed 17)  Diazepam Powd (Diazepam) 18)  Valium Vag Supp  .... As needed 19)  Ritalin Sr 20 Mg Tbcr (Methylphenidate hcl) .... One by mouth daily in am 20)  Zegerid 40-1100 Mg Caps (Omeprazole-sodium bicarbonate) .... One by mouth daily 21)  Amitiza 24 Mcg Caps (Lubiprostone) .... Two times a day  with food 22)  Cobal-1000 1000 Mcg/ml Inj Soln (Cyanocobalamin) .Marland Kitchen.. 1 ml im q month 23)  Desipramine Hcl 100 Mg Tabs (Desipramine hcl) .Marland Kitchen.. 1-4 by mouth q hs prn 24)  Majic Mouth Wash  .Marland Kitchen.. 10 cc gargle and spit tid 25)  Prempro 0.625-2.5 Mg Tabs (Conj estrog-medroxyprogest ace) .... Use as directed 26)  Avinza 90 Mg Xr24h-cap (Morphine sulfate beads) .... One by mouth daily 27)  Estring 2 Mg Ring (Estradiol) .... Use as directed 28)  Lactulose 10 Gm/41ml Soln (Lactulose) .... 30 cc by mouth q 8 hoiurs as needed for constipation hold for loose stools 29)  Baclofen 10 Mg Tabs (Baclofen) .Marland Kitchen.. 1 three times a day 30)  Slow Release Iron 47.5 Mg Cr-tabs (Ferrous sulfate) .... One by mouth daily 31)  Methotrexate 2.5 Mg Tabs (Methotrexate sodium) .... 8 tabs weekly 32)  Folic Acid 1 Mg  Tabs (Folic acid) .... 2 once daily 33)  Lunesta 3 Mg Tabs (Eszopiclone) .... One by mouth q hs 34)  Estrace 1 Mg Tabs (Estradiol) .Marland Kitchen.. 1 once daily 35)  Cipro 250 Mg Tabs (Ciprofloxacin hcl) .Marland Kitchen.. 1 two times a day 10 days  Other Orders: UA Dipstick w/o Micro (automated)  ([id number]) Prescriptions: CIPRO 250 MG TABS (CIPROFLOXACIN HCL) 1 two times a day 10 days  #20 x 0   Entered by:   Willy Eddy, LPN   Authorized by:   Stacie Glaze MD   Signed by:   Willy Eddy, LPN on 66/44/0347   Method used:   Electronically to        [facility]  [address] [phone]* (retail)       [address]       [place], [place]  [address]       Ph: [phone]       Fax: [phone]   RxID:   [id number]   Laboratory Results   Urine Tests  Date/Time Received: ,d October 15, 2009 10:14 AM   Date/Time Reported: October 15, 2009 10:14 AM   Routine Urinalysis   Color: yellow Appearance: Clear Glucose: negative   (Normal Range: Negative) Bilirubin: 1+   (Normal Range: Negative) Ketone: negative   (Normal Range: Negative) Spec. Gravity: >=1.030   (Normal Range: 1.003-1.035) Blood: trace-lysed   (Normal Range: Negative) pH: 7.0   (Normal Range: 5.0-8.0) Protein: 2+   (Normal Range: Negative) Urobilinogen: 0.2   (Normal Range: 0-1) Nitrite: negative   (Normal Range: Negative) Leukocyte Esterace: 1+   (Normal Range: Negative)    Comments: Rita Ohara  October 15, 2009 10:14 AM

## 2010-08-24 NOTE — Progress Notes (Signed)
Summary: Peer to Peer Review Needed  Phone Note Outgoing Call   Call placed by: Harold Barban,  May 28, 2010 2:26 PM Call placed to: Insurer Summary of Call: [employer] to authorize Open MRI of C-Spine without contrast they they are requesting more information with a peer to peer review. Please call [phone] and choose option 4 at your convience. Please do this by Monday, November 7th please.   Case # [id number] Initial call taken by: Harold Barban,  May 28, 2010 2:28 PM  Follow-up for Phone Call        approved for mri without contrast--[id number] Follow-up by: Willy Eddy, LPN,  May 28, 2010 2:39 PM

## 2010-08-24 NOTE — Progress Notes (Signed)
Summary: med request  Phone Note Call from Patient   Caller: Patient Call For: Stacie Glaze MD Summary of Call: Pt needs Avinza and Ritalin prescriptions written and call her when ready, please. [phone] Initial call taken by: Lynann Beaver CMA,  Dec 17, 2009 12:51 PM    Prescriptions: AVINZA 90 MG XR24H-CAP (MORPHINE SULFATE BEADS) one by mouth daily  #30 x 0   Entered by:   Willy Eddy, LPN   Authorized by:   Stacie Glaze MD   Signed by:   Willy Eddy, LPN on 19/14/7829   Method used:   Print then Give to Patient   RxID:   [id number] RITALIN SR 20 MG  TBCR (METHYLPHENIDATE HCL) one by mouth daily in am  #30 x 0   Entered by:   Willy Eddy, LPN   Authorized by:   Stacie Glaze MD   Signed by:   Willy Eddy, LPN on 95/28/4132   Method used:   Print then Give to Patient   RxID:   [id number] AVINZA 90 MG XR24H-CAP (MORPHINE SULFATE BEADS) one by mouth daily  #30 x 0   Entered by:   Willy Eddy, LPN   Authorized by:   Stacie Glaze MD   Signed by:   Willy Eddy, LPN on 47/42/5956   Method used:   Print then Give to Patient   RxID:   [id number] RITALIN SR 20 MG  TBCR (METHYLPHENIDATE HCL) one by mouth daily in am  #30 x 0   Entered by:   Willy Eddy, LPN   Authorized by:   Stacie Glaze MD   Signed by:   Willy Eddy, LPN on 66/12/3014   Method used:   Print then Give to Patient   RxID:   [id number] RITALIN SR 20 MG  TBCR (METHYLPHENIDATE HCL) one by mouth daily in am  #30 x 0   Entered by:   Willy Eddy, LPN   Authorized by:   Stacie Glaze MD   Signed by:   Willy Eddy, LPN on 42/70/6237   Method used:   Print then Give to Patient   RxID:   [id number] AVINZA 90 MG XR24H-CAP (MORPHINE SULFATE BEADS) one by mouth daily  #30 x 0   Entered by:   Willy Eddy, LPN   Authorized by:   Stacie Glaze MD   Signed by:   Willy Eddy, LPN on 01/18/9484   Method used:   Print  then Give to Patient   RxID:   [id number]

## 2010-08-24 NOTE — Letter (Signed)
Summary: [facility]  [facility]   Imported By: Maryln Gottron 09/18/2009 11:31:01  _____________________________________________________________________  External Attachment:    Type:   Image     Comment:   External Document

## 2010-08-24 NOTE — Progress Notes (Signed)
Summary: NS needed  Phone Note Call from Patient Call back at [facility] Phone [phone]   Summary of Call: Out of sample NS Nasacort or Flonase that he usually keeps me in, Veramyst does not help or dispense easily.  Is on Zpak, but also needs the NS.  RX to [facility].  Allergic sulfa,codeine, demeroll Initial call taken by: Rudy Jew, RN,  Dec 09, 2009 10:53 AM    Prescriptions: NASACORT AQ 55 MCG/ACT AERS (TRIAMCINOLONE ACETONIDE(NASAL)) Spray 2 spray into both nostrils once a day  #1 x 6   Entered by:   Willy Eddy, LPN   Authorized by:   Stacie Glaze MD   Signed by:   Willy Eddy, LPN on 09/81/1914   Method used:   Electronically to        [facility]  [address] [phone]* (retail)       [address]       [place], [place]  [address]       Ph: [phone]       Fax: [phone]   RxID:   [id number]

## 2010-08-24 NOTE — Letter (Signed)
Summary: [facility]   [facility]   Imported By: Maryln Gottron 04/12/2010 13:43:09  _____________________________________________________________________  External Attachment:    Type:   Image     Comment:   External Document

## 2010-08-24 NOTE — Progress Notes (Signed)
Summary: sinus  Phone Note Call from Patient   Caller: Patient Call For: Stacie Glaze MD Summary of Call: Pt is havng sinus complaints, coughing green, yellow, URI symptoms, and plans to go out of town Wed......Marland KitchenDad is having surgery for Cancer. [facility] [phone] Initial call taken by: Lynann Beaver CMA,  Dec 07, 2009 8:12 AM  Follow-up for Phone Call        may call in  z pack Follow-up by: Stacie Glaze MD,  Dec 07, 2009 8:30 AM  Additional Follow-up for Phone Call Additional follow up Details #1::        Left message on pt's personal voice mail to pick up meds. Additional Follow-up by: Lynann Beaver CMA,  Dec 07, 2009 8:49 AM    New/Updated Medications: ZITHROMAX Z-PAK 250 MG TABS (AZITHROMYCIN) as directed Prescriptions: ZITHROMAX Z-PAK 250 MG TABS (AZITHROMYCIN) as directed  #6 x 0   Entered by:   Lynann Beaver CMA   Authorized by:   Stacie Glaze MD   Signed by:   Lynann Beaver CMA on 12/07/2009   Method used:   [facility]ronically to        [facility]  [address] [phone]* (retail)       [address]       [place], [place]  [address]       Ph: [phone]       Fax: [phone]   RxID:   [id number]

## 2010-08-24 NOTE — Assessment & Plan Note (Signed)
Summary: 1 month rov/njr   Vital Signs:  Patient profile:   55 year old female Weight:      126 pounds Temp:     98.9 degrees F oral BP sitting:   120 / 70  (left arm) Cuff size:   regular  Vitals Entered By: Kern Reap CMA Duncan Dull) (March 26, 2010 3:00 PM) CC: follow-up visit   Primary Care Provider:  Stacie Glaze MD  CC:  follow-up visit.  History of Present Illness: the adderal xl helped and had better energu and focus this is an improvement over the ritalin the pt pain scores are better 6-7 /10 most days has not started the desiprimine due to retenton issues but Dr Logan Bores has given his approval no changes from the specialist the pt had to resume the methotrexate I have hopes fro TNF drugs  Current Medications (verified): 1)  Acyclovir 800 Mg Tabs (Acyclovir) .... Take 1/2 Tablet By Mouth Once A Day 2)  Aspirin 81 Mg Tbec (Aspirin) .... Take 1 Tablet By Mouth Every Morning 3)  D400 400 Unit Caps (Cholecalciferol) .... Take Twice A Day 4)  Elmiron 100 Mg Caps (Pentosan Polysulfate Sodium) .... Take 2 By Mouth Twice A Day 5)  Hydroxychloroquine Sulfate 200 Mg Tabs (Hydroxychloroquine Sulfate) .Marland Kitchen.. 1 Two Times A Day 6)  Hydroxyzine Hcl 25 Mg Tabs (Hydroxyzine Hcl) .... 2 Tablet By Mouth At Bedtime 7)  Lidoderm 5 % Ptch (Lidocaine) .... Apply 3 Patch To Skin Once A Day 8)  Lyrica 150 Mg Caps (Pregabalin) .... Take 1 Capsule By Mouth Three Times A Day 9)  Nasacort Aq 55 Mcg/act Aers (Triamcinolone Acetonide(Nasal)) .... Spray 2 Spray Into Both Nostrils Once A Day 10)  Phenazopyridine Plus 150-15-0.3 Mg Tabs (Phenazopyridine-Butabarb-Hyosc) .... One By Mouth  Three Times A Day Prn 11)  Sm Calcium/vitamin D 500-200 Mg-Unit Tabs (Calcium Carbonate-Vitamin D) .... Take 1 Tablet By Mouth Three Times A Day 12)  Xanax 1 Mg Tabs (Alprazolam) .... Take 1 Tablet By Mouth Three Times A Day 13)  Norvasc 5 Mg  Tabs (Amlodipine Besylate) .... Once Daily 14)  Lamictal 150 Mg Tabs  (Lamotrigine) .Marland Kitchen.. 1 Two Times A Day 15)  Zofran 4 Mg  Tabs (Ondansetron Hcl) .... As Needed 16)  Diazepam   Powd (Diazepam) 17)  Valium Vag Supp .... As Needed 18)  Amphetamine-Dextroamphetamine 30 Mg Xr24h-Cap (Amphetamine-Dextroamphetamine) .... One By Mouth Daily in Am 19)  Zegerid 40-1100 Mg Caps (Omeprazole-Sodium Bicarbonate) .... One By Mouth Daily 20)  Amitiza 24 Mcg  Caps (Lubiprostone) .... Two Times A Day  With Food 21)  Cobal-1000 1000 Mcg/ml Inj Soln (Cyanocobalamin) .Marland Kitchen.. 1 Ml Im Q Month 22)  Desipramine Hcl 100 Mg Tabs (Desipramine Hcl) .Marland Kitchen.. 1-4 By Mouth Q Hs Prn 23)  Majic Mouth Wash .Marland Kitchen.. 10 Cc Gargle and Spit Tid 24)  Avinza 90 Mg Xr24h-Cap (Morphine Sulfate Beads) .Marland Kitchen.. 1 Once Daily 25)  Estring 2 Mg Ring (Estradiol) .... Use As Directed 26)  Lactulose 10 Gm/61ml Soln (Lactulose) .... 30 Cc By Mouth Q 8 Hoiurs As Needed For Constipation Hold For Loose Stools 27)  Baclofen 10 Mg Tabs (Baclofen) .Marland Kitchen.. 1 Three Times A Day 28)  Slow Release Iron 47.5 Mg  Cr-Tabs (Ferrous Sulfate) .... One By Mouth Daily 29)  Methotrexate 2.5 Mg Tabs (Methotrexate Sodium) .... 8 Per Week 30)  Folic Acid 1 Mg Tabs (Folic Acid) .... 2 Once Daily 31)  Lunesta 3 Mg Tabs (Eszopiclone) .... One By Mouth  Q Hs 32)  Estrace 1 Mg Tabs (Estradiol) .Marland Kitchen.. 1 Once Daily 33)  Medroxyprogesterone Acetate 5 Mg Tabs (Medroxyprogesterone Acetate) .Marland Kitchen.. 1 Once Daily 34)  Morphine Sulfate 15 Mg Tabs (Morphine Sulfate) .Marland Kitchen.. 1 Every 6-8 Hours As Needed For Break Thru Pain  Allergies: 1)  ! Demerol (Meperidine Hcl) 2)  ! * Seritonine Uptake Inhibitors 3)  Codeine Phosphate (Codeine Phosphate) 4)  Sulfamethoxazole (Sulfamethoxazole)  Past History:  Family History: Last updated: 07/28/2008 Family History of CAD Female 1st degree relative <50 father wit esophageal cancer  Social History: Last updated: 04/28/2008 Retired Married Never Smoked Alcohol use-no Drug use-no  Risk Factors: Smoking Status: never  (03/02/2010)  Past medical, surgical, family and social histories (including risk factors) reviewed, and no changes noted (except as noted below).  Past Medical History: Reviewed history from 04/28/2008 and no changes required. 277.1/pct ibs/564.1 interstitial cystitis fibromyalgia Osteoarthritis GERD Depression Allergic rhinitis chronic pain  Past Surgical History: Reviewed history from 04/12/2007 and no changes required. Cervical fusion Cervical laminectomy cystoscopy Inguinal herniorrhaphy Sinus surgery GYN surgery hysteroscopy with ablation  Family History: Reviewed history from 07/28/2008 and no changes required. Family History of CAD Female 1st degree relative <50 father wit esophageal cancer  Social History: Reviewed history from 04/28/2008 and no changes required. Retired Married Never Smoked Alcohol use-no Drug use-no  Review of Systems       Flu Vaccine Consent Questions     Do you have a history of severe allergic reactions to this vaccine? no    Any prior history of allergic reactions to egg and/or gelatin? no    Do you have a sensitivity to the preservative Thimersol? no    Do you have a past history of Guillan-Barre Syndrome? no    Do you currently have an acute febrile illness? no    Have you ever had a severe reaction to latex? no    Vaccine information given and explained to patient? yes    Are you currently pregnant? no    Lot Number:[id number]   Exp Date:01/22/2011   Site Given  Left Deltoid IM   Physical Exam  General:  underweight appearing and pale.   Head:  normocephalic and atraumatic.   Eyes:  pupils equal and pupils round.   Ears:  R ear normal and L ear normal.   Nose:  nasal dischargemucosal pallor.   Mouth:  Oral mucosa and oropharynx without lesions or exudates.  Teeth in good repair. Neck:  No deformities, masses, muslce tenderness notes Lungs:  Normal respiratory effort, chest expands symmetrically. Lungs are clear to  auscultation, no crackles or wheezes. Heart:  Normal rate and regular rhythm. S1 and S2 normal without gallop, click, rub or other extra sounds.Grade   1/6 systolic ejection murmur.   Abdomen:  Bowel sounds positive,abdomen soft and non-tender without masses, organomegaly or hernias noted. Msk:  joint swelling and joint warmth.   Extremities:  No clubbing, cyanosis, edema, or deformity noted with normal full range of motion of all joints.   Neurologic:  alert & oriented X3, sensation intact to pinprick, gait normal, and finger-to-nose normal.     Impression & Recommendations:  Problem # 1:  SYMPTOM, MALAISE AND FATIGUE NEC (ICD-780.79)  Problem # 2:  DEPRESSION (ICD-311)  Her updated medication list for this problem includes:    Hydroxyzine Hcl 25 Mg Tabs (Hydroxyzine hcl) .Marland Kitchen... 2 tablet by mouth at bedtime    Xanax 1 Mg Tabs (Alprazolam) .Marland Kitchen... Take 1 tablet by mouth three times  a day    Desipramine Hcl 100 Mg Tabs (Desipramine hcl) .Marland Kitchen... 1-4 by mouth q hs prn    Trazodone Hcl 50 Mg Tabs (Trazodone hcl) .Marland Kitchen... 1/2 by mouth q hs  Discussed treatment options, including trial of antidpressant medication. Will refer to behavioral health. Follow-up call in in 24-48 hours and recheck in 2 weeks, sooner as needed. Patient agrees to call if any worsening of symptoms or thoughts of doing harm arise. Verified that the patient has no suicidal ideation at this time.   Problem # 3:  FIBROMYALGIA, SEVERE (ICD-729.1)  Her updated medication list for this problem includes:    Aspirin 81 Mg Tbec (Aspirin) .Marland Kitchen... Take 1 tablet by mouth every morning    Avinza 90 Mg Xr24h-cap (Morphine sulfate beads) .Marland Kitchen... 1 once daily    Baclofen 10 Mg Tabs (Baclofen) .Marland Kitchen... 1 three times a day    Morphine Sulfate 15 Mg Tabs (Morphine sulfate) .Marland Kitchen... 1 every 6-8 hours as needed for break thru pain  Complete Medication List: 1)  Acyclovir 800 Mg Tabs (Acyclovir) .... Take 1/2 tablet by mouth once a day 2)  Aspirin 81 Mg  Tbec (Aspirin) .... Take 1 tablet by mouth every morning 3)  D400 400 Unit Caps (Cholecalciferol) .... Take twice a day 4)  Elmiron 100 Mg Caps (Pentosan polysulfate sodium) .... Take 2 by mouth twice a day 5)  Hydroxychloroquine Sulfate 200 Mg Tabs (Hydroxychloroquine sulfate) .Marland Kitchen.. 1 two times a day 6)  Hydroxyzine Hcl 25 Mg Tabs (Hydroxyzine hcl) .... 2 tablet by mouth at bedtime 7)  Lidoderm 5 % Ptch (Lidocaine) .... Apply 3 patch to skin once a day 8)  Lyrica 150 Mg Caps (Pregabalin) .... Take 1 capsule by mouth three times a day 9)  Nasacort Aq 55 Mcg/act Aers (Triamcinolone acetonide(nasal)) .... Spray 2 spray into both nostrils once a day 10)  Phenazopyridine Plus 150-15-0.3 Mg Tabs (Phenazopyridine-butabarb-hyosc) .... One by mouth  three times a day prn 11)  Sm Calcium/vitamin D 500-200 Mg-unit Tabs (Calcium carbonate-vitamin d) .... Take 1 tablet by mouth three times a day 12)  Xanax 1 Mg Tabs (Alprazolam) .... Take 1 tablet by mouth three times a day 13)  Norvasc 5 Mg Tabs (Amlodipine besylate) .... Once daily 14)  Lamictal 150 Mg Tabs (Lamotrigine) .Marland Kitchen.. 1 two times a day 15)  Zofran 4 Mg Tabs (Ondansetron hcl) .... As needed 16)  Diazepam Powd (Diazepam) 17)  Valium Vag Supp  .... As needed 18)  Amphetamine-dextroamphetamine 30 Mg Xr24h-cap (Amphetamine-dextroamphetamine) .... One by mouth daily in am 19)  Zegerid 40-1100 Mg Caps (Omeprazole-sodium bicarbonate) .... One by mouth daily 20)  Amitiza 24 Mcg Caps (Lubiprostone) .... Two times a day  with food 21)  Cobal-1000 1000 Mcg/ml Inj Soln (Cyanocobalamin) .Marland Kitchen.. 1 ml im q month 22)  Desipramine Hcl 100 Mg Tabs (Desipramine hcl) .Marland Kitchen.. 1-4 by mouth q hs prn 23)  Majic Mouth Wash  .Marland Kitchen.. 10 cc gargle and spit tid 24)  Avinza 90 Mg Xr24h-cap (Morphine sulfate beads) .Marland Kitchen.. 1 once daily 25)  Estring 2 Mg Ring (Estradiol) .... Use as directed 26)  Lactulose 10 Gm/93ml Soln (Lactulose) .... 30 cc by mouth q 8 hoiurs as needed for  constipation hold for loose stools 27)  Baclofen 10 Mg Tabs (Baclofen) .Marland Kitchen.. 1 three times a day 28)  Slow Release Iron 47.5 Mg Cr-tabs (Ferrous sulfate) .... One by mouth daily 29)  Methotrexate 2.5 Mg Tabs (Methotrexate sodium) .... 8 per week 30)  Folic Acid 1 Mg Tabs (Folic acid) .... 2 once daily 31)  Estrace 1 Mg Tabs (Estradiol) .Marland Kitchen.. 1 once daily 32)  Medroxyprogesterone Acetate 5 Mg Tabs (Medroxyprogesterone acetate) .Marland Kitchen.. 1 once daily 33)  Morphine Sulfate 15 Mg Tabs (Morphine sulfate) .Marland Kitchen.. 1 every 6-8 hours as needed for break thru pain 34)  Trazodone Hcl 50 Mg Tabs (Trazodone hcl) .... 1/2 by mouth q hs  Other Orders: Admin 1st Vaccine (40981) Flu Vaccine 72yrs + (19147)  Patient Instructions: 1)  ask rhematology about the TNF drugs 2)  Please schedule a follow-up appointment in 2 months. Prescriptions: AMPHETAMINE-DEXTROAMPHETAMINE 30 MG XR24H-CAP (AMPHETAMINE-DEXTROAMPHETAMINE) one by mouth daily in AM  #30 x 0   Entered and Authorized by:   Stacie Glaze MD   Signed by:   Stacie Glaze MD on 03/26/2010   Method used:   Print then Give to Patient   RxID:   [id number] AMPHETAMINE-DEXTROAMPHETAMINE 30 MG XR24H-CAP (AMPHETAMINE-DEXTROAMPHETAMINE) one by mouth daily in AM  #30 x 0   Entered and Authorized by:   Stacie Glaze MD   Signed by:   Stacie Glaze MD on 03/26/2010   Method used:   Print then Give to Patient   RxID:   [id number]

## 2010-08-26 NOTE — Assessment & Plan Note (Signed)
Summary: 2 MTH ROV // RS   Vital Signs:  Patient profile:   55 year old female Height:      68 inches Weight:      134 pounds BMI:     20.45 Temp:     98.2 degrees F oral Pulse rate:   76 / minute Resp:     14 per minute BP sitting:   110 / 70  (left arm)  Vitals Entered By: Willy Eddy, LPN (July 30, 2010 11:48 AM) CC: roa, Abdominal Pain Is Patient Diabetic? No   Primary Care Provider:  Stacie Glaze MD  CC:  roa and Abdominal Pain.  History of Present Illness: Had a visit with Delma Officer and dicussed the bone spurs and the nerve pinching ( has 5-6 C ) and now the involvment is 3-4 He assed PT and lodine two times a day whioch has helped. The numbness is better. Surgery is a option... but she wishes that this is the last option. Injections are the next step. The pt has not had GI effects/ side effects  Dyspepsia History:      There is a prior history of GERD.  The patient does not have a prior history of documented ulcer disease.     Preventive Screening-Counseling & Management  Alcohol-Tobacco     Smoking Status: never     Tobacco Counseling: not indicated; no tobacco use  Problems Prior to Update: 1)  Cervical Radiculopathy  (ICD-723.4) 2)  Acute Cystitis  (ICD-595.0) 3)  Neck Pain, Chronic  (ICD-723.1) 4)  Unspecified Synovitis and Tenosynovitis  (ICD-727.00) 5)  Constipation, Drug Induced  (ICD-564.09) 6)  Anemia of Chronic Disease  (ICD-285.29) 7)  Weight Loss, Abnormal  (ICD-783.21) 8)  Physical Examination  (ICD-V70.0) 9)  Family History of Cad Female 1st Degree Relative <50  (ICD-V17.3) 10)  Chest Pain Unspecified  (ICD-786.50) 11)  Cramp in Limb  (ICD-729.82) 12)  Aphthous Ulcers  (ICD-528.2) 13)  Abnormal Weight Gain  (ICD-783.1) 14)  Insomnia, Chronic  (ICD-307.42) 15)  Constipation, Drug Induced  (ICD-564.09) 16)  Abdominal Pain Right Lower Quadrant  (ICD-789.03) 17)  Allergic Rhinitis  (ICD-477.9) 18)  Symptom, Malaise and Fatigue  Nec  (ICD-780.79) 19)  Disorders, Orgnc Insomnia D/t Mental Disorder  (ICD-327.02) 20)  Depression  (ICD-311) 21)  Symptom, Malaise and Fatigue Nec  (ICD-780.79) 22)  [name] Syndrome  (ICD-443.0) 23)  Rosacea  (ICD-695.3) 24)  Gerd  (ICD-530.81) 25)  Neuralgia  (ICD-729.2) 26)  Osteoarthritis  (ICD-715.90) 27)  Fibromyalgia, Severe  (ICD-729.1) 28)  Cystitis, Chronic Interstitial  (ICD-595.1) 29)  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)  Current Problems (verified): 1)  Cervical Radiculopathy  (ICD-723.4) 2)  Acute Cystitis  (ICD-595.0) 3)  Neck Pain, Chronic  (ICD-723.1) 4)  Unspecified Synovitis and Tenosynovitis  (ICD-727.00) 5)  Constipation, Drug Induced  (ICD-564.09) 6)  Anemia of Chronic Disease  (ICD-285.29) 7)  Weight Loss, Abnormal  (ICD-783.21) 8)  Physical Examination  (ICD-V70.0) 9)  Family History of Cad Female 1st Degree Relative <50  (ICD-V17.3) 10)  Chest Pain Unspecified  (ICD-786.50) 11)  Cramp in Limb  (ICD-729.82) 12)  Aphthous Ulcers  (ICD-528.2) 13)  Abnormal Weight Gain  (ICD-783.1) 14)  Insomnia, Chronic  (ICD-307.42) 15)  Constipation, Drug Induced  (ICD-564.09) 16)  Abdominal Pain Right Lower Quadrant  (ICD-789.03) 17)  Allergic Rhinitis  (ICD-477.9) 18)  Symptom, Malaise and Fatigue Nec  (ICD-780.79) 19)  Disorders, Orgnc Insomnia D/t Mental Disorder  (ICD-327.02) 20)  Depression  (ICD-311) 21)  Symptom, Malaise and Fatigue Nec  (ICD-780.79) 22)  [name] Syndrome  (ICD-443.0) 23)  Rosacea  (ICD-695.3) 24)  Gerd  (ICD-530.81) 25)  Neuralgia  (ICD-729.2) 26)  Osteoarthritis  (ICD-715.90) 27)  Fibromyalgia, Severe  (ICD-729.1) 28)  Cystitis, Chronic Interstitial  (ICD-595.1) 29)  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 Take 1/2 Tablet By Mouth Once A Day 2)  Aspirin 81 Mg Tbec (Aspirin) .... Take 1 Tablet By Mouth Every Morning 3)  D400 400 Unit Caps (Cholecalciferol) ....  Take Twice A Day 4)  Elmiron 100 Mg Caps (Pentosan Polysulfate Sodium) .... Take 2 By Mouth Twice A Day 5)  Hydroxychloroquine Sulfate 200 Mg Tabs (Hydroxychloroquine Sulfate) .Marland Kitchen.. 1 Two Times A Day 6)  Hydroxyzine Hcl 25 Mg Tabs (Hydroxyzine Hcl) .Marland Kitchen.. 1 Tablet By Mouth At Bedtime 7)  Lidoderm 5 % Ptch (Lidocaine) .... Apply 3 Patch To Skin Once A Day 8)  Lyrica 150 Mg Caps (Pregabalin) .... Take 1 Capsule By Mouth Three Times A Day 9)  Nasacort Aq 55 Mcg/act Aers (Triamcinolone Acetonide(Nasal)) .... Spray 2 Spray Into Both Nostrils Once A Day 10)  Phenazopyridine Plus 150-15-0.3 Mg Tabs (Phenazopyridine-Butabarb-Hyosc) .... One By Mouth  Three Times A Day Prn 11)  Sm Calcium/vitamin D 500-200 Mg-Unit Tabs (Calcium Carbonate-Vitamin D) .... Take 1 Tablet By Mouth Three Times A Day 12)  Xanax 1 Mg Tabs (Alprazolam) .... Take 1 Tablet By Mouth Three Times A Day 13)  Norvasc 5 Mg  Tabs (Amlodipine Besylate) .... Once Daily 14)  Lamictal 150 Mg Tabs (Lamotrigine) .Marland Kitchen.. 1 Two Times A Day 15)  Zofran 4 Mg  Tabs (Ondansetron Hcl) .... As Needed 16)  Diazepam   Powd (Diazepam) 17)  Valium Vag Supp .... As Needed 18)  Amphetamine-Dextroamphetamine 30 Mg Xr24h-Cap (Amphetamine-Dextroamphetamine) .... One By Mouth Daily in Am 19)  Zegerid 40-1100 Mg Caps (Omeprazole-Sodium Bicarbonate) .... One By Mouth Daily 20)  Amitiza 24 Mcg  Caps (Lubiprostone) .... Two Times A Day  With Food 21)  Cobal-1000 1000 Mcg/ml Inj Soln (Cyanocobalamin) .Marland Kitchen.. 1 Ml Im Q Month 22)  Desipramine Hcl 100 Mg Tabs (Desipramine Hcl) .Marland Kitchen.. 1-4 By Mouth Q Hs Prn 23)  Majic Mouth Wash .Marland Kitchen.. 10 Cc Gargle and Spit Tid 24)  Avinza 90 Mg Xr24h-Cap (Morphine Sulfate Beads) .Marland Kitchen.. 1 Once Daily 25)  Estring 2 Mg Ring (Estradiol) .... Use As Directed 26)  Baclofen 10 Mg Tabs (Baclofen) .Marland Kitchen.. 1 Three Times A Day 27)  Slow Release Iron 47.5 Mg  Cr-Tabs (Ferrous Sulfate) .... One By Mouth Daily 28)  Methotrexate 2.5 Mg Tabs (Methotrexate Sodium)  .... 8 Per Week 29)  Folic Acid 1 Mg Tabs (Folic Acid) .... 2 Once Daily 30)  Estrace 1 Mg Tabs (Estradiol) .Marland Kitchen.. 1 Once Daily 31)  Medroxyprogesterone Acetate 5 Mg Tabs (Medroxyprogesterone Acetate) .Marland Kitchen.. 1 Once Daily 32)  Morphine Sulfate 15 Mg Tabs (Morphine Sulfate) .Marland Kitchen.. 1 Every 6-8 Hours As Needed For Break Thru Pain 33)  Lunesta 3 Mg Tabs (Eszopiclone) .Marland Kitchen.. 1 At Bedtime  Current Medications (verified): 1)  Acyclovir 800 Mg Tabs (Acyclovir) .... Take 1/2 Tablet By Mouth Once A Day 2)  Aspirin 81 Mg Tbec (Aspirin) .... Take 1 Tablet By Mouth Every Morning 3)  D400 400 Unit Caps (Cholecalciferol) .... Take Twice A Day 4)  Elmiron 100 Mg Caps (Pentosan Polysulfate Sodium) .... Take 2 By Mouth Twice A Day 5)  Hydroxychloroquine Sulfate 200 Mg Tabs (Hydroxychloroquine Sulfate) .Marland Kitchen.. 1 Two Times A Day 6)  Hydroxyzine Hcl 25 Mg Tabs (Hydroxyzine Hcl) .Marland Kitchen.. 1 Tablet By Mouth At Bedtime 7)  Lidoderm 5 % Ptch (Lidocaine) .... Apply 3 Patch To Skin Once A Day 8)  Lyrica 150 Mg Caps (Pregabalin) .... Take 1 Capsule By Mouth Three Times A Day 9)  Nasacort Aq 55 Mcg/act Aers (Triamcinolone Acetonide(Nasal)) .... Spray 2 Spray Into Both Nostrils Once A Day 10)  Phenazopyridine Plus 150-15-0.3 Mg Tabs (Phenazopyridine-Butabarb-Hyosc) .... One By Mouth  Three Times A Day Prn 11)  Sm Calcium/vitamin D 500-200 Mg-Unit Tabs (Calcium Carbonate-Vitamin D) .... Take 1 Tablet By Mouth Three Times A Day 12)  Xanax 1 Mg Tabs (Alprazolam) .... Take 1 Tablet By Mouth Three Times A Day 13)  Norvasc 5 Mg  Tabs (Amlodipine Besylate) .... Once Daily 14)  Lamictal 150 Mg Tabs (Lamotrigine) .Marland Kitchen.. 1 Two Times A Day 15)  Zofran 4 Mg  Tabs (Ondansetron Hcl) .... As Needed 16)  Diazepam   Powd (Diazepam) 17)  Valium Vag Supp .... As Needed 18)  Amphetamine-Dextroamphetamine 30 Mg Xr24h-Cap (Amphetamine-Dextroamphetamine) .... One By Mouth Daily in Am 19)  Zegerid 40-1100 Mg Caps (Omeprazole-Sodium Bicarbonate) .... One By Mouth  Daily 20)  Amitiza 24 Mcg  Caps (Lubiprostone) .... Two Times A Day  With Food 21)  Cobal-1000 1000 Mcg/ml Inj Soln (Cyanocobalamin) .Marland Kitchen.. 1 Ml Im Q Month 22)  Desipramine Hcl 100 Mg Tabs (Desipramine Hcl) .Marland Kitchen.. 1-4 By Mouth Q Hs Prn 23)  Majic Mouth Wash .Marland Kitchen.. 10 Cc Gargle and Spit Tid 24)  Avinza 90 Mg Xr24h-Cap (Morphine Sulfate Beads) .Marland Kitchen.. 1 Once Daily 25)  Estring 2 Mg Ring (Estradiol) .... Use As Directed 26)  Baclofen 10 Mg Tabs (Baclofen) .Marland Kitchen.. 1 Three Times A Day 27)  Slow Release Iron 47.5 Mg  Cr-Tabs (Ferrous Sulfate) .... One By Mouth Daily 28)  Methotrexate 2.5 Mg Tabs (Methotrexate Sodium) .... 8 Per Week 29)  Folic Acid 1 Mg Tabs (Folic Acid) .... 2 Once Daily 30)  Estrace 1 Mg Tabs (Estradiol) .Marland Kitchen.. 1 Once Daily 31)  Medroxyprogesterone Acetate 5 Mg Tabs (Medroxyprogesterone Acetate) .Marland Kitchen.. 1 Once Daily 32)  Morphine Sulfate 15 Mg Tabs (Morphine Sulfate) .Marland Kitchen.. 1 Every 6-8 Hours As Needed For Break Thru Pain 33)  Lunesta 3 Mg Tabs (Eszopiclone) .Marland Kitchen.. 1 At Bedtime  Allergies (verified): 1)  ! Demerol (Meperidine Hcl) 2)  ! * Seritonine Uptake Inhibitors 3)  Codeine Phosphate (Codeine Phosphate) 4)  Sulfamethoxazole (Sulfamethoxazole)  Past History:  Family History: Last updated: 07/28/2008 Family History of CAD Female 1st degree relative <50 father wit esophageal cancer  Social History: Last updated: 04/28/2008 Retired Married Never Smoked Alcohol use-no Drug use-no  Risk Factors: Smoking Status: never (07/30/2010)  Past medical, surgical, family and social histories (including risk factors) reviewed, and no changes noted (except as noted below).  Past Medical History: Reviewed history from 04/28/2008 and no changes required. 277.1/pct ibs/564.1 interstitial cystitis fibromyalgia Osteoarthritis GERD Depression Allergic rhinitis chronic pain  Past Surgical History: Reviewed history from 04/12/2007 and no changes required. Cervical fusion Cervical  laminectomy cystoscopy Inguinal herniorrhaphy Sinus surgery GYN surgery hysteroscopy with ablation  Family History: Reviewed history from 07/28/2008 and no changes required. Family History of CAD Female 1st degree relative <50 father wit esophageal cancer  Social History: Reviewed history from 04/28/2008 and no changes required. Retired Married Never Smoked Alcohol use-no Drug use-no  Review of Systems  The patient denies anorexia,  fever, weight loss, weight gain, vision loss, decreased hearing, hoarseness, chest pain, syncope, dyspnea on exertion, peripheral edema, prolonged cough, headaches, hemoptysis, abdominal pain, melena, hematochezia, severe indigestion/heartburn, hematuria, incontinence, genital sores, muscle weakness, suspicious skin lesions, transient blindness, difficulty walking, depression, unusual weight change, abnormal bleeding, enlarged lymph nodes, angioedema, and breast masses.    Physical Exam  General:  underweight appearing and pale.   Head:  normocephalic and atraumatic.   Eyes:  pupils equal and pupils round.   Ears:  R ear normal and L ear normal.   Nose:  nasal dischargemucosal pallor.   Mouth:  Oral mucosa and oropharynx without lesions or exudates.  Teeth in good repair. Neck:  No deformities, masses, muslce tenderness notes Lungs:  Normal respiratory effort, chest expands symmetrically. Lungs are clear to auscultation, no crackles or wheezes. Heart:  Normal rate and regular rhythm. S1 and S2 normal without gallop, click, rub or other extra sounds.Grade   1/6 systolic ejection murmur.   Abdomen:  Bowel sounds positive,abdomen soft and non-tender without masses, organomegaly or hernias noted. Msk:  joint swelling and joint warmth.   Extremities:  No clubbing, cyanosis, edema, or deformity noted with normal full range of motion of all joints.   Neurologic:  alert & oriented X3, sensation intact to pinprick, gait normal, and finger-to-nose normal.      Impression & Recommendations:  Problem # 1:  CERVICAL RADICULOPATHY (ICD-723.4) Assessment Unchanged consideration of spinal injection  Problem # 2:  NECK PAIN, CHRONIC (ICD-723.1)  Her updated medication list for this problem includes:    Aspirin 81 Mg Tbec (Aspirin) .Marland Kitchen... Take 1 tablet by mouth every morning    Avinza 90 Mg Xr24h-cap (Morphine sulfate beads) .Marland Kitchen... 1 once daily    Baclofen 10 Mg Tabs (Baclofen) .Marland Kitchen... 1 three times a day    Morphine Sulfate 15 Mg Tabs (Morphine sulfate) .Marland Kitchen... 1 every 6-8 hours as needed for break thru pain  Discussed exercises and use of moist heat or cold and medication.   Problem # 3:  ANEMIA OF CHRONIC DISEASE (ICD-285.29)  monitering Her updated medication list for this problem includes:    Cobal-1000 1000 Mcg/ml Inj Soln (Cyanocobalamin) .Marland Kitchen... 1 ml im q month    Slow Release Iron 47.5 Mg Cr-tabs (Ferrous sulfate) ..... One by mouth daily    Folic Acid 1 Mg Tabs (Folic acid) .Marland Kitchen... 2 once daily  Hgb: 10.8 (03/17/2009)   Hct: 33.1 (03/17/2009)   Platelets: 306 (03/17/2009) RBC: 4.01 (10/29/2008)   RDW: 13.9 (10/29/2008)   WBC: 5.4 (03/17/2009) MCV: 84.4 (03/17/2009)   MCHC: 34.3 (10/29/2008) B12: 393 (10/29/2008)   Folate: 10.6 (02/28/2008)   TSH: 1.12 (12/30/2008)  Problem # 4:  FIBROMYALGIA, SEVERE (ICD-729.1)  Her updated medication list for this problem includes:    Aspirin 81 Mg Tbec (Aspirin) .Marland Kitchen... Take 1 tablet by mouth every morning    Avinza 90 Mg Xr24h-cap (Morphine sulfate beads) .Marland Kitchen... 1 once daily    Baclofen 10 Mg Tabs (Baclofen) .Marland Kitchen... 1 three times a day    Morphine Sulfate 15 Mg Tabs (Morphine sulfate) .Marland Kitchen... 1 every 6-8 hours as needed for break thru pain  Problem # 5:  CYSTITIS, CHRONIC INTERSTITIAL (ICD-595.1)  Complete Medication List: 1)  Acyclovir 800 Mg Tabs (Acyclovir) .... Take 1/2 tablet by mouth once a day 2)  Aspirin 81 Mg Tbec (Aspirin) .... Take 1 tablet by mouth every morning 3)  D400 400 Unit Caps  (Cholecalciferol) .... Take twice a day 4)  Elmiron  100 Mg Caps (Pentosan polysulfate sodium) .... Take 2 by mouth twice a day 5)  Hydroxychloroquine Sulfate 200 Mg Tabs (Hydroxychloroquine sulfate) .Marland Kitchen.. 1 two times a day 6)  Hydroxyzine Hcl 25 Mg Tabs (Hydroxyzine hcl) .Marland Kitchen.. 1 tablet by mouth at bedtime 7)  Lidoderm 5 % Ptch (Lidocaine) .... Apply 3 patch to skin once a day 8)  Lyrica 150 Mg Caps (Pregabalin) .... Take 1 capsule by mouth three times a day 9)  Nasacort Aq 55 Mcg/act Aers (Triamcinolone acetonide(nasal)) .... Spray 2 spray into both nostrils once a day 10)  Phenazopyridine Plus 150-15-0.3 Mg Tabs (Phenazopyridine-butabarb-hyosc) .... One by mouth  three times a day prn 11)  Sm Calcium/vitamin D 500-200 Mg-unit Tabs (Calcium carbonate-vitamin d) .... Take 1 tablet by mouth three times a day 12)  Xanax 1 Mg Tabs (Alprazolam) .... Take 1 tablet by mouth three times a day 13)  Norvasc 5 Mg Tabs (Amlodipine besylate) .... Once daily 14)  Lamictal 150 Mg Tabs (Lamotrigine) .Marland Kitchen.. 1 two times a day 15)  Zofran 4 Mg Tabs (Ondansetron hcl) .... As needed 16)  Diazepam Powd (Diazepam) 17)  Valium Vag Supp  .... As needed 18)  Amphetamine-dextroamphetamine 30 Mg Xr24h-cap (Amphetamine-dextroamphetamine) .... One by mouth daily in am 19)  Zegerid 40-1100 Mg Caps (Omeprazole-sodium bicarbonate) .... One by mouth daily 20)  Amitiza 24 Mcg Caps (Lubiprostone) .... Two times a day  with food 21)  Cobal-1000 1000 Mcg/ml Inj Soln (Cyanocobalamin) .Marland Kitchen.. 1 ml im q month 22)  Desipramine Hcl 100 Mg Tabs (Desipramine hcl) .Marland Kitchen.. 1-4 by mouth q hs prn 23)  Majic Mouth Wash  .Marland Kitchen.. 10 cc gargle and spit tid 24)  Avinza 90 Mg Xr24h-cap (Morphine sulfate beads) .Marland Kitchen.. 1 once daily 25)  Estring 2 Mg Ring (Estradiol) .... Use as directed 26)  Baclofen 10 Mg Tabs (Baclofen) .Marland Kitchen.. 1 three times a day 27)  Slow Release Iron 47.5 Mg Cr-tabs (Ferrous sulfate) .... One by mouth daily 28)  Methotrexate 2.5 Mg Tabs  (Methotrexate sodium) .... 8 per week 29)  Folic Acid 1 Mg Tabs (Folic acid) .... 2 once daily 30)  Estrace 1 Mg Tabs (Estradiol) .Marland Kitchen.. 1 once daily 31)  Medroxyprogesterone Acetate 5 Mg Tabs (Medroxyprogesterone acetate) .Marland Kitchen.. 1 once daily 32)  Morphine Sulfate 15 Mg Tabs (Morphine sulfate) .Marland Kitchen.. 1 every 6-8 hours as needed for break thru pain 33)  Lunesta 3 Mg Tabs (Eszopiclone) .Marland Kitchen.. 1 at bedtime  Other Orders: TLB-CBC Platelet - w/Differential (85025-CBCD) TLB-Hepatic/Liver Function Pnl (80076-HEPATIC) TLB-BMP (Basic Metabolic Panel-BMET) (80048-METABOL) Fingerstick (04540) TLB-TSH (Thyroid Stimulating Hormone) (84443-TSH) TLB-CRP-High Sensitivity (C-Reactive Protein) (86140-FCRP)  Dyspepsia Assessment/Plan:  Step Therapy: GERD Treatment Protocols:    Step-1: improved  Patient Instructions: 1)  Please schedule a follow-up appointment in 3 months. Prescriptions: AVINZA 90 MG XR24H-CAP (MORPHINE SULFATE BEADS) 1 once daily  #30 x 0   Entered and Authorized by:   Stacie Glaze MD   Signed by:   Stacie Glaze MD on 07/30/2010   Method used:   Print then Give to Patient   RxID:   [id number] AVINZA 90 MG XR24H-CAP (MORPHINE SULFATE BEADS) 1 once daily  #30 x 0   Entered and Authorized by:   Stacie Glaze MD   Signed by:   Stacie Glaze MD on 07/30/2010   Method used:   Print then Give to Patient   RxID:   [id number] AVINZA 90 MG XR24H-CAP (MORPHINE SULFATE BEADS) 1 once daily  #30 x  0   Entered and Authorized by:   Stacie Glaze MD   Signed by:   Stacie Glaze MD on 07/30/2010   Method used:   Print then Give to Patient   RxID:   [id number] AMITIZA 24 MCG  CAPS (LUBIPROSTONE) two times a day  with food  #60 Capsule x 8   Entered and Authorized by:   Stacie Glaze MD   Signed by:   Stacie Glaze MD on 07/30/2010   Method used:   Print then Give to Patient   RxID:   [id number] AMPHETAMINE-DEXTROAMPHETAMINE 30 MG XR24H-CAP  (AMPHETAMINE-DEXTROAMPHETAMINE) one by mouth daily in AM  #30 x 0   Entered and Authorized by:   Stacie Glaze MD   Signed by:   Stacie Glaze MD on 07/30/2010   Method used:   Print then Give to Patient   RxID:   [id number] AMPHETAMINE-DEXTROAMPHETAMINE 30 MG XR24H-CAP (AMPHETAMINE-DEXTROAMPHETAMINE) one by mouth daily in AM  #30 x 0   Entered and Authorized by:   Stacie Glaze MD   Signed by:   Stacie Glaze MD on 07/30/2010   Method used:   Print then Give to Patient   RxID:   [id number] AMPHETAMINE-DEXTROAMPHETAMINE 30 MG XR24H-CAP (AMPHETAMINE-DEXTROAMPHETAMINE) one by mouth daily in AM  #30 x 0   Entered and Authorized by:   Stacie Glaze MD   Signed by:   Stacie Glaze MD on 07/30/2010   Method used:   Print then Give to Patient   RxID:   [id number]    Orders Added: 1)  TLB-CBC Platelet - w/Differential [85025-CBCD] 2)  TLB-Hepatic/Liver Function Pnl [80076-HEPATIC] 3)  TLB-BMP (Basic Metabolic Panel-BMET) [80048-METABOL] 4)  Fingerstick [36416] 5)  TLB-TSH (Thyroid Stimulating Hormone) [84443-TSH] 6)  TLB-CRP-High Sensitivity (C-Reactive Protein) [86140-FCRP] 7)  Est. Patient Level IV [32355]  Appended Document: Orders Update     Clinical Lists Changes  Orders: Added new Service order of Specimen Handling (73220) - Signed

## 2010-08-26 NOTE — Consult Note (Signed)
Summary: [facility]  [facility]   Imported By: Maryln Gottron 07/30/2010 14:28:17  _____________________________________________________________________  External Attachment:    Type:   Image     Comment:   External Document

## 2010-08-26 NOTE — Progress Notes (Signed)
Summary: refills  Phone Note Refill Request Call back at Home Phone [phone] Message from:  Patient---live call  Refills Requested: Medication #1:  BACLOFEN 10 MG TABS 1 three times a day  Medication #2:  ACYCLOVIR 800 MG TABS Take 1/2 tablet by mouth once a day send to [facility]  Initial call taken by: Warnell Forester,  July 12, 2010 9:15 AM    Prescriptions: BACLOFEN 10 MG TABS (BACLOFEN) 1 three times a day  #270 x 3   Entered by:   Willy Eddy, LPN   Authorized by:   Stacie Glaze MD   Signed by:   Willy Eddy, LPN on 09/81/1914   Method used:   Electronically to        [facility]* (retail)             ,          Ph: [phone]       Fax: [phone]   RxID:   [id number] ACYCLOVIR 800 MG TABS (ACYCLOVIR) Take 1/2 tablet by mouth once a day  #90 x 3   Entered by:   Willy Eddy, LPN   Authorized by:   Stacie Glaze MD   Signed by:   Willy Eddy, LPN on 53/66/4403   Method used:   Electronically to        [facility]* (retail)             ,          Ph: [phone]       Fax: [phone]   RxID:   [id number]

## 2010-09-17 ENCOUNTER — Telehealth: Payer: Self-pay | Admitting: Internal Medicine

## 2010-09-17 NOTE — Telephone Encounter (Signed)
Triage vm----has ulcers inside of her mouth. She had some leftover magic mouthwash, which has expired. She would like a new rx for the mouthwash [facility]   Call her at [phone] or her cell.

## 2010-09-17 NOTE — Telephone Encounter (Signed)
maajic mouthwash [facility] and left message on machine for pt

## 2010-10-01 ENCOUNTER — Other Ambulatory Visit: Payer: Self-pay | Admitting: Obstetrics and Gynecology

## 2010-10-01 DIAGNOSIS — Z1231 Encounter for screening mammogram for malignant neoplasm of breast: Secondary | ICD-10-CM

## 2010-10-11 ENCOUNTER — Ambulatory Visit
Admission: RE | Admit: 2010-10-11 | Discharge: 2010-10-11 | Disposition: A | Discharge status: Home / Self Care | Payer: 59 | Source: Ambulatory Visit | Attending: Obstetrics and Gynecology | Admitting: Obstetrics and Gynecology

## 2010-10-11 DIAGNOSIS — Z1231 Encounter for screening mammogram for malignant neoplasm of breast: Secondary | ICD-10-CM

## 2010-10-19 ENCOUNTER — Encounter: Payer: Self-pay | Admitting: Internal Medicine

## 2010-10-22 ENCOUNTER — Telehealth: Payer: Self-pay | Admitting: Internal Medicine

## 2010-10-22 DIAGNOSIS — E538 Deficiency of other specified B group vitamins: Secondary | ICD-10-CM

## 2010-10-22 MED ORDER — CYANOCOBALAMIN 1000 MCG/ML IJ SOLN: 1000.0000 ug | Freq: Once | INTRAMUSCULAR | Status: AC

## 2010-10-22 MED ORDER — CYANOCOBALAMIN 1000 MCG/ML IJ SOLN: 1000.0000 ug | Freq: Once | INTRAMUSCULAR | Status: DC

## 2010-10-22 NOTE — Telephone Encounter (Signed)
done

## 2010-10-22 NOTE — Telephone Encounter (Signed)
Pt has moved and has changed pharmacies. Pt is req refill of B-12 injectables to be [facility] to [facility] [address] [phone]

## 2010-10-24 ENCOUNTER — Other Ambulatory Visit: Payer: Self-pay | Admitting: Internal Medicine

## 2010-11-01 ENCOUNTER — Ambulatory Visit: Payer: Self-pay | Admitting: Internal Medicine

## 2010-11-09 ENCOUNTER — Telehealth: Payer: Self-pay | Admitting: Internal Medicine

## 2010-11-09 DIAGNOSIS — M797 Fibromyalgia: Secondary | ICD-10-CM

## 2010-11-09 MED ORDER — MORPHINE SULFATE ER BEADS 90 MG PO CP24: 90.0000 mg | ORAL_CAPSULE | Freq: Every day | ORAL | Status: DC

## 2010-11-09 NOTE — Telephone Encounter (Signed)
Pt needs new rx avinza 90mg 

## 2010-11-09 NOTE — Telephone Encounter (Signed)
done

## 2010-11-15 ENCOUNTER — Other Ambulatory Visit: Payer: Self-pay | Admitting: *Deleted

## 2010-11-15 MED ORDER — ESZOPICLONE 3 MG PO TABS: 3.0000 mg | ORAL_TABLET | Freq: Every day | ORAL | Status: DC

## 2010-11-19 ENCOUNTER — Encounter: Payer: Self-pay | Admitting: Internal Medicine

## 2010-11-22 ENCOUNTER — Encounter: Payer: Self-pay | Admitting: Internal Medicine

## 2010-11-26 ENCOUNTER — Ambulatory Visit: Payer: 59 | Admitting: Internal Medicine

## 2010-11-26 ENCOUNTER — Ambulatory Visit: Payer: Self-pay | Admitting: Internal Medicine

## 2010-11-26 ENCOUNTER — Encounter: Payer: Self-pay | Admitting: Internal Medicine

## 2010-11-26 ENCOUNTER — Other Ambulatory Visit: Payer: Self-pay | Admitting: *Deleted

## 2010-11-26 ENCOUNTER — Ambulatory Visit (INDEPENDENT_AMBULATORY_CARE_PROVIDER_SITE_OTHER): Payer: 59 | Admitting: Internal Medicine

## 2010-11-26 VITALS — BP 110/70 | HR 72 | Temp 98.2°F | Resp 14 | Ht 68.0 in | Wt 138.0 lb

## 2010-11-26 DIAGNOSIS — IMO0001 Reserved for inherently not codable concepts without codable children: Secondary | ICD-10-CM

## 2010-11-26 DIAGNOSIS — G4700 Insomnia, unspecified: Secondary | ICD-10-CM

## 2010-11-26 DIAGNOSIS — M542 Cervicalgia: Secondary | ICD-10-CM

## 2010-11-26 DIAGNOSIS — D638 Anemia in other chronic diseases classified elsewhere: Secondary | ICD-10-CM

## 2010-11-26 DIAGNOSIS — M5412 Radiculopathy, cervical region: Secondary | ICD-10-CM

## 2010-11-26 LAB — CBC WITH DIFFERENTIAL/PLATELET
HCT: 36.6 % (ref 36.0–46.0)
Hemoglobin: 11.9 g/dL — ABNORMAL LOW (ref 12.0–15.0)
Lymphocytes Relative: 34 % (ref 12–46)
Lymphs Abs: 2.2 10*3/uL (ref 0.7–4.0)
Monocytes Absolute: 0.7 10*3/uL (ref 0.1–1.0)
Monocytes Relative: 10 % (ref 3–12)
Neutro Abs: 3.5 10*3/uL (ref 1.7–7.7)
Neutrophils Relative %: 53 % (ref 43–77)
RBC: 3.88 MIL/uL (ref 3.87–5.11)
WBC: 6.6 10*3/uL (ref 4.0–10.5)

## 2010-11-26 MED ORDER — AMPHETAMINE-DEXTROAMPHET ER 30 MG PO CP24: 30.0000 mg | ORAL_CAPSULE | ORAL | Status: DC

## 2010-11-26 NOTE — Progress Notes (Signed)
Subjective:    Patient ID: Rachel Duran, female    DOB: [birth year], 55 y.o.   MRN: [id number]  HPI Still having pain with the cervical radiculopathy. Trying home traction therapy. Followed by Delma Officer. Epidural is the next step. This is followed for chronic pain control for severe fibromyalgia  Current pain medications use is stable Irritable bowel is unstable with current flare of diarrhea,no current rashes She has a history of anemia  Review of Systems  Constitutional: Negative for activity change, appetite change and fatigue.  HENT: Negative for ear pain, congestion, neck pain, postnasal drip and sinus pressure.   Eyes: Negative for redness and visual disturbance.  Respiratory: Negative for cough, shortness of breath and wheezing.   Gastrointestinal: Negative for abdominal pain and abdominal distention.  Genitourinary: Negative for dysuria, frequency and menstrual problem.  Musculoskeletal: Positive for myalgias, back pain, joint swelling and arthralgias.  Skin: Negative for rash and wound.  Neurological: Negative for dizziness, weakness and headaches.  Hematological: Negative for adenopathy. Does not bruise/bleed easily.  Psychiatric/Behavioral: Negative for sleep disturbance and decreased concentration.   Past Medical History  Diagnosis Date  . Disorders of porphyrin metabolism   . Irritable bowel syndrome   . Fibromyalgia   . Arthritis   . GERD (gastroesophageal reflux disease)   . Depression   . Allergy   . Chronic pain    Past Surgical History  Procedure Date  . Cervical fusion   . Cervical lamination   . Cystoscopy   . Hernia repair   . Nasal sinus surgery   . Gyn surgery   . Hysteroscopy with ablation     reports that she has never smoked. She does not have any smokeless tobacco history on file. She reports that she does not drink alcohol or use illicit drugs. family history includes Esophageal cancer in her father and Heart disease in her  father. Allergies  Allergen Reactions  . Codeine Phosphate     REACTION: unspecified  . Meperidine Hcl     REACTION: red skin  . Sulfamethoxazole     REACTION: unspecified       Objective:   Physical Exam  Constitutional: She is oriented to person, place, and time. She appears well-developed and well-nourished. No distress.  HENT:  Head: Normocephalic and atraumatic.  Right Ear: External ear normal.  Left Ear: External ear normal.  Nose: Nose normal.  Mouth/Throat: Oropharynx is clear and moist.  Eyes: Conjunctivae and EOM are normal. Pupils are equal, round, and reactive to light.  Neck: Normal range of motion. Neck supple. No JVD present. No tracheal deviation present. No thyromegaly present.  Cardiovascular: Normal rate, regular rhythm, normal heart sounds and intact distal pulses.   No murmur heard. Pulmonary/Chest: Effort normal and breath sounds normal. She has no wheezes. She exhibits no tenderness.  Abdominal: Soft. Bowel sounds are normal.  Musculoskeletal: Normal range of motion. She exhibits no edema and no tenderness.       Right shoulder: She exhibits tenderness.       Left shoulder: She exhibits tenderness.       Right elbow: tenderness found.       Left elbow: tenderness found.       Right hip: She exhibits tenderness.       Left hip: She exhibits tenderness.       Right knee: tenderness found.       Left knee: tenderness found.  Lymphadenopathy:    She has no cervical adenopathy.  Neurological:  She is alert and oriented to person, place, and time. She has normal reflexes. No cranial nerve deficit.  Skin: Skin is warm and dry. She is not diaphoretic.  Psychiatric: She has a normal mood and affect. Her behavior is normal.          Assessment & Plan:  The primary complaint is her cervical radiculopathy The fibromyalgia is stable but still at severe levels The IC is stable Her IBS is moderately worse. Monitoring of CBC and discussion of vitamin  supplementation

## 2010-11-27 LAB — IRON: Iron: 67 ug/dL (ref 42–145)

## 2010-11-29 ENCOUNTER — Telehealth: Payer: Self-pay | Admitting: *Deleted

## 2010-11-29 ENCOUNTER — Encounter: Payer: Self-pay | Admitting: Internal Medicine

## 2010-11-29 ENCOUNTER — Encounter: Payer: Self-pay | Admitting: *Deleted

## 2010-11-29 NOTE — Telephone Encounter (Signed)
Left message on machine Ready for pick up 

## 2010-11-29 NOTE — Telephone Encounter (Signed)
Pt needs letter for underwriters (buying a house) on Dr. York Ram letter head stating her disability will more than likely continue 3 more years.   NEEDS THIS IN AT LEAST 3 DAYS, PLEASE.

## 2010-12-07 NOTE — Op Note (Signed)
Rachel Duran, Rachel Duran NO.:  [id number]   MEDICAL RECORD NO.:  [id number]          PATIENT TYPE:  AMB   LOCATION:  [facility]                         FACILITY:  [facility]   PHYSICIAN:  Jamison Neighbor, M.D.  DATE OF BIRTH:  10/01/1955   DATE OF PROCEDURE:  09/14/2007  DATE OF DISCHARGE:                               OPERATIVE REPORT   PREOPERATIVE DIAGNOSIS:  Chronic pelvic pain with history of  interstitial cystitis.   POSTOPERATIVE DIAGNOSIS:  Chronic pelvic pain with history of  interstitial cystitis.   PROCEDURE:  Cystoscopy, urethral calibration, hydrodistention of the  bladder, Marcaine and Pyridium installation, Marcaine and Kenalog  injection.   SURGEON:  Jamison Neighbor, M.D.   ANESTHESIA:  General.   COMPLICATIONS:  None.   DRAINS:  None.   BRIEF HISTORY:  This 55 year old female has an excessively complicated  urologic history.  She was under the care of Dr. Retia Passe for many  years and has a diagnosis of interstitial cystitis.  Her situation,  however, is complicated by the fact that she has a number of other well  documented illnesses that impact on her bladder including bipolar  disorder, chronic fatigue syndrome, fibromyalgia, irritable bowel  syndrome and vulvodynia.  She has worsening problems with pelvic floor  dysfunction and is seeing our physical therapist regularly for pelvic  floor therapy.  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  In the past, she has responded cystoscopy and hydrodistention. She  requested this procedure be performed.  She understands the risks and  benefits of the procedure and gave full informed consent.   PROCEDURE IN DETAIL:  After the successful induction of general  anesthesia, the patient was placed in the dorsal lithotomy position,  prepped with Betadine, and draped in the usual sterile fashion.  Careful  bimanual  examination revealed no cystocele, rectocele or enterocele.  There were no masses on bimanual exam.  The uterus was palpably normal.  The urethra was free of any lesions with no signs of diverticulum.  The  urethra was calibrated to 32-French with female urethral sounds with no  signs of stenosis or stricture.  The cystoscope was inserted.  The  bladder was carefully inspected.  No tumors or stones could be seen.  Both ureteral orifices were in normal configuration and location.  Hydrodistention of the bladder was performed.  The bladder was distended  at a pressure of 100 cmH2O for five minutes. When the bladder was  drained, the bladder was free of glomerulations.  There was a very  normal looking bladder which suggests that she has had a good response  to medications since previous hydrodistentions done by Dr. Elige Radon had  clearly confirmed the presence of IC. The bladder capacity is normal at  1300 mL.   The bladder was drained.  A mixture of Marcaine and Pyridium was left in  the bladder. Marcaine and Kenalog was injected as a postoperative block.  The patient tolerated the procedure well and was taken to the recovery  room in  good condition.  She will continue on her current medication  list which is listed on the medical reconciliation form.  She has  adequate pain management and is on antibiotic therapy for her chronic  rosacea so additional antibiotic  coverage will not be required.  The patient will return in follow up for  re-evaluation. It is likely that she will continue on the same protocol  but will most certainly require additional physical therapy for her  pelvic floor dysfunction.      Jamison Neighbor, M.D.  Electronically Signed     RJE/MEDQ  D:  09/14/2007  T:  09/14/2007  Job:  [id number]

## 2010-12-07 NOTE — Assessment & Plan Note (Signed)
[facility]                         GASTROENTEROLOGY OFFICE NOTE   Rachel Duran, Rachel Duran                    MRN:          [id number]  DATE:11/21/2007                            DOB:          [birth year]    SUBJECTIVE:  Ms. Berling returns today at the request of Dr. Stacie Glaze for chronic constipation and crampy lower abdominal pain.  She  was evaluated for this problem last year.  She has medication-induced  constipation.  An air contrast barium enema in March 2008, revealed a  markedly tortuous and redundant colon with no polyps or other mucosal  lesions.  There was significant retention of air and barium on the post-  evacuation films.  She underwent a flexible sigmoidoscopy in May 2008,  which showed small internal hemorrhoids and no other abnormalities to  the descending colon.  She uses GlycoLax five times daily as well as a  fiber supplement and Amitiza twice daily.  She states her bowel  movements are still irregular, pellet-like and occasionally associated  with loose liquid and mucus.  She generally has about two bowel  movements per week.  She has not noted any rectal bleeding, change in  stool caliber or weight loss.  Her narcotic dose is apparently stable.   CURRENT MEDICATIONS:  Listed on the chart, updated and reviewed.   ALLERGIES:  SULFA, CODEINE AND DEMEROL.   PHYSICAL EXAMINATION:  GENERAL:  In no acute distress.  VITAL SIGNS:  Weight 140.8 pounds, blood pressure 100/60, pulse 88 and  regular, height 5 feet 7 inches.  CHEST:  Clear to auscultation bilaterally.  HEART:  A regular rate and rhythm without murmurs appreciated.  ABDOMEN:  Soft with minimal lower abdominal tenderness to deep  palpation.  No rebound or guarding.  No palpable organomegaly, masses or  herniae.  Normoactive bowel sounds.   RADIOLOGY:  A CT scan of the abdomen and pelvis performed on September 06, 2007, showed moderate stool and gas within the colon.   No other  abnormalities were noted.   LABORATORY DATA:  A CBC and basic metabolic panel from September 06, 2007, was unremarkable except for a slightly low chloride at 93 and a  slightly low sodium at 132.   ASSESSMENT/PLAN:  Chronic constipation with lower abdominal pain and  bloating, secondary to medications:  I have advised her to increase her  dose of GlycoLax.  She can take Glycolax up to eight daily.  She does  not feel that Amitiza has improved her diarrhea, so she may discontinue  this medication.  We will supply her with contact information of area  clinical research groups that she may contact regarding trials on  constipation, and she would like to pursue this.  I have also advised her to contact [facility] and [employer].  Ongoing followup with Dr. Stacie Glaze.     Venita Lick. Russella Dar, MD, [facility]  Electronically Signed    MTS/MedQ  DD: 11/21/2007  DT: 11/21/2007  Job #: [id number]   cc:   Stacie Glaze, MD

## 2010-12-10 NOTE — Op Note (Signed)
NAMEWILLIA, Rachel Duran   MEDICAL RECORD NO.:  [id number]                   PATIENT TYPE:  INP   LOCATION:  2855                                 FACILITY:  [facility]   PHYSICIAN:  Rachel Duran, M.D.                 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   PROCEDURE:  C5-6, C6-7 anterior cervical diskectomy and fusion, left iliac  crest bone graft.   SURGEON:  Rachel Duran, M.D.   ANESTHESIA:  GOT.   ESTIMATED BLOOD LOSS:  250 cubic centimeters.   DRAINS:  One Hemovac in the neck.   DESCRIPTION OF PROCEDURE:  After induction of general anesthesia and  orotracheal intubation, head Holter traction application, sand bag behind  the neck, gel bag, which was an IV bag wrapped in a towel, underneath the  left buttocks.  Neck and iliac crest were prepped with Duraprep.  The area  was squared with towels.  A sterile skin marker was used in the skin of the  neck, and half of a Betadine Vi-Drape was applied.   A half sheet in between, a sterile Mayo stand at the head, thyroid sheets  and drapes.  Incision was made at the midline extending to the left in line  with a skin fold.  Platysma was divided in line with the skin.  Blunt  dissection was performed deep and cephalad primarily since the incision was  based over the C6-7 disk space by palpable landmarks.  A large spur at C6-7  was identified by palpation.  The longus colli was split from the midline  and a needle was placed at C5-6, confirmed with a crosstable lateral plain  radiograph.   Diskectomy was performed after Cloward self-retaining retractors were  placed.  Uncovertebral joints were stripped with sequential curetting  from  4.0 up to #1.  Sequential drilling was then performed with a 12 mm drill  back to the posterior cortex using the offset guide, extended the plug  slightly superiorly.  Once the posterior cortex was drilled down and was a  millimeter thick, the operating microscope was draped and brought in.  The  posterior longitudinal ligament was taken down and disk material which was  retained by the ligament was removed.  They were center and left.  These  pieces were pressing on the dura and once removed, the dura tented up with  no areas of compression.  Spurs were removed right and left and the  uncovertebral joints were stripped one final time, removing all cartilage  for egress of fluids.  A 14 mm plug was harvested from the left iliac crest,  splitting  the fascia in line with the fibers.  Initial plug was taken from  the single split and trimmed to size based on depth gauge and impacted.   A second disk at C6-7 was performed in identical procedure, with this level  being more severe spondylosis with the end plates within 1 mm of touching  posteriorly.  Larger spurs were present but no disk herniation through the  posterior longitudinal ligament.  The cord was directly visualized with the  operating microscope after removal of all old disk material and hypertrophic  ligament.  After irrigation with saline, the second plug was taken through  the second split in the fascia from the left iliac crest, which was a very  thick plug.  It was downsized, trimmed, bullet-nosed, then impacted into a  solid position.  After irrigation with saline solution, the operative field  was dry.  Initially during the case a piece of Surgicel was placed up over  the body of C5 slightly to the left with a small bleeder coming up directly  out of the vertebral body.  This was dry at the conclusion of the case.  Platysma was reapproximated with 3-0 Vicryl, 4-0 Vicryl subcuticular skin  closure.  Tincture of Benzoin  and Steri-Strips and Marcaine infiltration,  soft collar application after 4 x 4's.  The iliac crest was closed with 0  Vicryl in the fascia, 2-0 Vicryl in the subcutaneous tissue, 3-0 Vicryl in  the subcu, and a 4-0 subcuticular skin closure.  Tincture of Benzoin and  Steri-Strips and a dressing.  Instrument count and needle count was correct.  The patient tolerated the procedure well and was neurologically intact.                                               Rachel Duran, M.D.    MCY/MEDQ  D:  08/07/2002  T:  08/08/2002  Job:  [id number]

## 2010-12-10 NOTE — H&P (Signed)
NAME:  Rachel Duran, Rachel Duran NO.:  [id number]   MEDICAL RECORD NO.:  [id number]                   PATIENT TYPE:  AMB   LOCATION:  DAY                                  FACILITY:  [facility]   PHYSICIAN:  Ethelene Browns., M.D.            DATE OF BIRTH:  01-30-1956   DATE OF ADMISSION:  11/22/2002  DATE OF DISCHARGE:                                HISTORY & PHYSICAL   CHIEF COMPLAINT:  This patient age 55 is scheduled for cystoscopy,  hydrodistention at [facility] on November 22, 2002 with a chief complaint of  painful, frequent urination which has been worse for at least three to four  weeks.   HISTORY OF PRESENT ILLNESS:  This 55 year old female has had urinary tract  infections, mainly cystitis at irregular intervals for some 25 years and did  well until about 2003 when she had an infection and bladder symptoms which  did not clear following antibiotics.  Cystoscopy and hydrodistention  revealed __________ typical of interstitial cystitis.  The patient did well  following her hydrodistention January 11, 2002 until the last three or four  weeks when she has had increasing pain in her bladder, urgency, voiding  every 30-60 minutes, getting up as much as four to five times a night.  She  has passed no gross blood, gravel, or stone.  Her urinalysis was normal in  the office on November 19, 2002 and she was advised to have a repeat  cystoscopy, hydrodistention, and Clorpactin irrigation of her bladder.   ALLERGIES:  SULFA and CODEINE and ZELNORM.   REGULAR MEDICATIONS:  Remeron, hydroxychloroquine, Activella all from Dr.  Lovell Sheehan.  He also recently added Toprol for blood pressure.   PAST MEDICAL HISTORY:  1. 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  2. She has also had a bunionectomy 1994.  3. Cesarean section 1985.  4. Nasal surgeries on four occasions.  5. Inguinal hernia repair  1985.  6. Tonsillectomy 1965.   FAMILY HISTORY:  Positive for scleroderma, heart disease, high blood  pressure, diabetes, and her mother died age 73 of scleroderma.  Father is  living in his 104s.  She has three siblings.  Her spouse is in good health.  She has two children living and well.   SOCIAL HISTORY:  The patient does not abuse alcohol or tobacco.  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   REVIEW OF SYSTEMS:  GENERAL:  General health has been poor in the past year.  Weight is stable.  HEENT:  She did have surgery on her neck in 2003 and is  having less neck pain.  CARDIORESPIRATORY:  No shortness of breath, asthma,  or heart attack.  GASTROINTESTINAL:  She does have a history of peptic ulcer  disease and no history of hepatitis.  She has irritable bowel syndrome.  BONES, JOINTS, MUSCLES:  Unremarkable except for the neck which is much  improved following her surgery.  EXTREMITIES:  Unremarkable.  SKIN:  She has  frequent rashes and hives, especially when exposed to sun.  LYMPHATIC AND  HEMATOPOIETIC:  No nodes or anemia.  OB/GYN:  She has had two pregnancies,  one cesarean section, and recent gynecologic examination was unremarkable.   PHYSICAL EXAMINATION:  GENERAL:  Well-developed, well-nourished 55 year old  female.  VITAL SIGNS:  Temperature 97.8, pulse 112, respirations 18, blood pressure  178/97.  HEENT:  The ears and tympanic membranes unremarkable.  Eyes react normally  to light and accommodation.  Extraocular movements are intact.  Pharynx is  benign.  Teeth in good repair.  NECK:  No enlargement of thyroid.  No nodes palpable.  CHEST:  Clear to percussion, auscultation.  HEART:  Normal size, rhythm.  No murmur detected.  BREASTS:  Not examined.  ABDOMEN:  Flat.  Liver, kidney, spleen, masses, tenderness, or hernia are  not detected.  She does have marked  suprapubic tenderness, however.  PELVIC:  Recently done by her GYN doctor who found her bladder to be very  tender and will be repeated at cystoscopy.  EXTREMITIES:  No edema.  Good peripheral pulses.  NEUROLOGIC:  Normal reflexes and sensation.  LYMPHATIC/HEMATOPOIETIC:  No nodes.  SKIN:  No lesions.   IMPRESSION:  1. Interstitial cystitis.  2. History of urinary tract infections and cystitis 25 years ago onset.  3. Porphyria cutanea tarda.   PLAN:  Cystoscopy, hydrodistention, Clorpactin irrigation of bladder  scheduled [facility] on November 22, 2002.                                               Ethelene Browns., M.D.    HB/MEDQ  D:  11/20/2002  T:  11/20/2002  Job:  [id number]

## 2010-12-10 NOTE — Assessment & Plan Note (Signed)
[facility]                         GASTROENTEROLOGY OFFICE NOTE   Rachel, Duran                      MRN:          [id number]  DATE:08/09/2006                            DOB:          [birth year]    REFERRING PHYSICIAN:  Stacie Glaze, MD   REASON FOR REFERRAL:  Chronic constipation and abdominal bloating.   HISTORY OF PRESENT ILLNESS:  Rachel Duran is a 55 year old white female  whom I have seen in the past.  She underwent colonoscopy in March of  1999 for irritable bowel syndrome and hematochezia.  The examination was  complete to the hepatic flexure.  The colon was noted to be tortuous and  the procedure was difficult.  Internal hemorrhoids were noted.  An air-  contrast barium enema was recommended at the time, but apparently was  not completed.  She has had a long history of severe constipation with  abdominal pain and bloating that appears to be relieved with bowel  movements.  Her constipation substantially worsened several years ago  when she began the regular use of morphine for chronic pain.  She has  recently been using MiraLax, lactulose, Fleet suppositories, Fleet  enemas, and herbal tea laxatives, with inadequate results.  She was on  Zelnorm, for a while in the past, which led to headaches, and Zelnorm  was discontinued.  She notes no change in stool caliber, melena,  hematochezia, or weight loss.  She does have frequent problems with  heartburn and reflux that are fairly well-controlled with the regular  use of Zegerid and Zantac both on a daily basis.  Abdominal films  performed in May 2007 showed mild constipation and small bilateral  pelvic phleboliths.  She states that she had blood work performed by Dr.  Corliss Skains within the past few months; and she is uncertain whether a TSH  was performed.  Her abdominal pain is associated with her bloating and  is generalized; and generally improves with bowel movements.  There is  no  family history of colon cancer, colon polyps, or inflammatory bowel  disease.   PAST MEDICAL HISTORY:  1. Interstitial cystitis.  2. Porphyria cutanea tarda.  3. Fibromyalgia.  4. Neuralgia.  5. Osteoarthritis.  6. [name] disease.  7. Degenerative disk disease.  8. Irritable bowel syndrome.  9. Chronic constipation.  10.Seasonal allergies.  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  12.Bipolar disorder.  13.Insomnia.  14.Rosacea.  15.Hyperkeratosis.  16.Chronic fatigue syndrome.  17.GERD.  18.Internal hemorrhoids.   CURRENT MEDICATIONS:  Listed on the chart-reviewed.   MEDICATION ALLERGIES:  SULFA and CODEINE.   SOCIAL HISTORY AND REVIEW OF SYSTEMS:  Per the handwritten form.   PHYSICAL EXAMINATION:  A well-developed, well-nourished, white female in  no acute distress.  Height 5 feet 6 inches.  Weight 131 pounds.  Blood pressure is 112/68;  pulse 100 and regular.  HEENT EXAM:  Anicteric sclerae.  Oropharynx clear.  CHEST:  Clear to auscultation bilaterally.  CARDIAC:  Regular rate and rhythm without murmurs appreciated.  ABDOMEN:  Soft with mild diffuse tenderness to deep palpation.  No  rebound or guarding.  No palpable organomegaly, masses or  hernias.  Normoactive bowel sounds.  No distention appreciated.  RECTAL EXAM:  Deferred at patient's request.  EXTREMITIES:  Without clubbing, cyanosis, or edema.  NEUROLOGIC:  Alert and oriented x3.  Grossly nonfocal.   ASSESSMENT AND PLAN:  Worsening chronic constipation with associated  generalized abdominal pain and bloating.  I suspect that all her  symptoms are related to constipation and are exacerbated by morphine and  other medication side effects.  She is to increase GlycoLax to 4-5 times  a day and discontinue lactulose and enemas.  She is advised to use a low-  gas diet.  She is advised to begin fiber supplements and increase her  fluid intake.  Obtain stool Hemoccults and schedule an air-contrasted  barium enema for further evaluation.   We will request recent blood work  from Dr. Fatima Sanger office.  She may need additional blood work  depending what was previously performed.  Return office visit in 6-8  weeks.  Consider flexible sigmoidoscopy to complete the evaluation  pending results from her barium enema and response to the above  measures.     Venita Lick. Russella Dar, MD, [facility]  Electronically Signed    MTS/MedQ  DD: 08/10/2006  DT: 08/10/2006  Job #: [id number]   cc:   Stacie Glaze, MD

## 2010-12-10 NOTE — Op Note (Signed)
   Rachel Duran, [place] NO.:  [id number]   MEDICAL RECORD NO.:  [id number]                   PATIENT TYPE:  AMB   LOCATION:  DAY                                  FACILITY:  [facility]   PHYSICIAN:  Ethelene Browns., M.D.            DATE OF BIRTH:  06-Jun-1956   DATE OF PROCEDURE:  11/22/2002  DATE OF DISCHARGE:                                 OPERATIVE REPORT   PREOPERATIVE DIAGNOSES:  1. Interstitial cystitis.  2. Porphyria cutanea tarda.  3. History of recurrent urinary tract infections.   POSTOPERATIVE DIAGNOSES:  1. Interstitial cystitis.  2. Porphyria cutanea tarda.  3. History of recurrent urinary tract infections.   OPERATION PERFORMED:  1. Cystoscopy.  2. Hydrodistention.  3. Clorpactin irrigation of bladder.   DESCRIPTION OF OPERATION:  This 55 year old female brought to the operating  room, received IV gentamycin 80 mg, and underwent the successful induction  of general anesthesia.  She was prepped and draped in the lithotomy  position.  The bladder was inspected with a #22 cystourethroscope using 70  and 12 degree lenses.  Initially, there was no stone, tumor, nor ulcer and  only very mild hyperemia and erythema.  The right and left ureteral orifices  were normal.  The bladder was then distended to capacity which was 900 mL  and decompressed.  She developed generalized petechial hemorrhages  throughout the bladder and some large hemorrhages with some bleeding over  the trigone, anterior wall, and lateral walls to a rather severe degree.  The patient's bladder was emptied and then irrigated with 1000 mL of  Clorpactin solution, emptied again, and the patient returned to recovery  area in a stable condition.   The plan is for the patient to go home on a regular diet.  We will give her  Levaquin 500 mg daily and Vicodin for pain.  She will see [place] in the office  next week.  She will avoid caffeine and alcohol.  She will continue her  regular medications which are Remeron, hydrochloroquine, Activella, and  Toprol XL  15 mg.                                               Ethelene Browns., M.D.    HB/MEDQ  D:  11/22/2002  T:  11/22/2002  Job:  [id number]

## 2010-12-10 NOTE — H&P (Signed)
[facility]  Patient:    Rachel Duran, Rachel Duran Visit Number: [id number] MRN: [id number]          Service Type: Attending:  Radene Knee., M.D. Dictated by:   Radene Knee., M.D. Adm. Date:  01/04/02   CC:         Brook A. Edward Jolly, M.D.             Stacie Glaze, M.D. [facility]                         History and Physical  DATE OF BIRTH:  16-Nov-1955.  BRIEF HISTORY:  This patient comes to the office on January 04, 2002, on referral from Dr. Edward Jolly with the chief complaint of 3-4 weeks of urgency, frequency, and dysuria.  HISTORY OF PRESENT ILLNESS:  This 55 year old female states that she had urinary tract infections, mainly cystitis, frequently when she first got married some 20-25 years ago and then did well and 1 or 2 infections about 10 years ago and then did well until the last 3-4 weeks when she developed urgency and voiding every 30 to 60 minutes, getting 3 times a night.  she had pain with intercourse, and had pain with her gynecologic examination in the bladder.  She has not passed gross blood, gravel or stone and she did not respond to a course of amoxicillin.  In our office she had an ultrasound that revealed no significant residual urine, no hydronephrosis.  A culture was obtained and she was scheduled for cystohydrodistention and Clorpactin for suspected interstitial cystitis.  ALLERGIES:  SULFA and CODEINE.  PRESENT MEDICATIONS: 1. Hydroxychloroquine 100 mg Monday, Wednesday, Friday. 2. Zelnorm 6 mg twice a day. 3. Zyrtec D 1 twice a day. 4. Vioxx p.r.n. 5. Activella 1 daily. 6. Temazepam 30 mg h.s. p.r.n. for sleep. 7. She has completed her amoxicillin and nystatin silicone cream from the    gynecologist.  PAST MEDICAL HISTORY:  Reveals that she has had been diagnosed with porphyria cutanea tarda which is an autoimmune disease and she has had hysteroscopy in August of 2000, with ablation of the end of the lining.  She had  bunionectomy in 1994, C-section 1985, nasal surgeries x4, between 1979 and 1989, inguinal hernia 1985, tonsillectomy 1965.  FAMILY HISTORY:  Positive for scleroderma, heart disease, high blood pressure, diabetes and her mother died of scleroderma age 8, and her father is living at age 60.  She has 3 siblings between 54 and 40.  Her spouse is in good health, age 36, children age 101 and 71.  SOCIAL HISTORY:  Reveals that she does not abuse alcohol or tobacco.  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  REVIEW OF SYSTEMS:  General health has been fairly good. Weight is stable. HEENT:  Unremarkable since she had her nasal surgeries and her chronic sinusitis clear up. Cardiovascular:  She denies any chest pain, shortness of breath or asthma.  Gastrointestinal:  She does have a history of peptic ulcer disease, which has been quiet recently as well as irritable bowel syndrome and she has not had hepatitis and her constipation is controlled with the Zelnorm. She has not passed any bloody or tarry stools.  Bones, joints, muscles unremarkable.  Extremities:  Unremarkable.  Skin:  She does have frequent rashes and hives especially with the sun.  Lymphatic hematopoietic:  No nodes or anemia.  Ob/Gyn:  She has 2 pregnancies, 1 C-section and a recent gynecologic examination which was apparently unremarkable.  PHYSICAL EXAMINATION:  GENERAL:  Well-developed, well-nourished 55 year old female, appears younger than her stated age.  VITAL SIGNS:  Temperature 98.5, pulse 105, respirations 16, blood pressure 146/89.  HEENT:  The ears and tympanic membranes unremarkable.  Eyes react normally to light and accommodation.  Extraocular movement is intact.  Pharynx is in good repair.  NECK:  No thyroid or nodes felt.  CHEST:  Clear to auscultation and percussion.  HEART:  Normal sinus rhythm no murmur  detected.  ABDOMEN:  Flat.  Liver, kidney, spleen, masses, hernia, not detected but she does have marked suprapubic tenderness.  PELVIC:  Will be done at the time of cystoscopy but the patient reports that the GYN doctor found her bladder to be very tender and she has had pain with intercourse.  EXTREMITIES:  No edema.  Good peripheral pulses.  NEUROLOGIC:  Grossly normal reflexes and sensation.  LYMPHATIC/ HEMATOPOIETIC:  No nodes.  SKIN:  No lesions but she does have considerable tanning.  IMPRESSION: 1. Interstitial cystitis. 2. History of urinary tract infection and cystitis for some 20-25 years.  PLAN:  We will culture the urine.  We will do cystohydrodistention and Clorpactin in [facility]. Dictated by:   Radene Knee., M.D. Attending:  Radene Knee., M.D. DD:  01/04/02 TD:  01/06/02 Job: [id number]

## 2010-12-10 NOTE — Op Note (Signed)
[facility]  Patient:    Rachel Duran, Rachel Duran Visit Number: [id number] MRN: [id number]          Service Type: Attending:  Radene Knee., M.D. Dictated by:   Radene Knee., M.D. Proc. Date: 01/11/02   CC:         Stacie Glaze, M.D. [facility]  Brook A. Edward Jolly, M.D.   Operative Report  PREOPERATIVE DIAGNOSES: 1. Interstitial cystitis. 2. History of urinary tract infections. 3. Porphyria cutanea tarda.  POSTOPERATIVE DIAGNOSES: 1. Interstitial cystitis. 2. History of urinary tract infections. 3. Porphyria cutanea tarda.  OPERATION PERFORMED: 1. Cystoscopy. 2. Hydrodistention. 3. Clorpactin irrigation of the bladder.  DESCRIPTION OF OPERATION:  This 55 year old female brought to the operating room, underwent successful induction of general anesthesia, after preparation with IV gentamicin.  She was prepped and draped in the lithotomy position, examined under anesthesia.  There were no abnormal masses, good rectal tone. The bladder and urethra had good support.  Using the #28 sound, the urethra dilated easily.  There was no evidence of stricture.  The bladder was then inspected with a #22 cystourethroscope using 70 and 12 degree lenses. Initially there was no hyperemia or erythema, no stone or tumor noted, right and left ureteral orifices were normal.  There was only slight prominence of the vasculature.  The bladder was then distended to capacity which was 950 mL and decompressed.  She developed severe generalized hemorrhages throughout the entire bladder with cracking and bleeding in a number of areas.  The bladder was emptied and irrigated with 1000 cc of Clorpactin solution, and the patient returned to recovery area in stable condition.  PLAN:  The patient will take Cipro 500 q.d. for prophylaxis against infection. We will give her Vicodin 1-2 q.4h. p.r.n. for moderate pain and will give her Mepergan Fortis 1 q.4-6h. p.r.n. severe pain.   She will return to the office in 1-2 weeks.  Avoid caffeine and alcohol. Dictated by:   Radene Knee., M.D. Attending:  Radene Knee., M.D. DD:  01/11/02 TD:  01/12/02 Job: [id number]

## 2010-12-13 ENCOUNTER — Ambulatory Visit (HOSPITAL_COMMUNITY)
Admission: RE | Admit: 2010-12-13 | Discharge: 2010-12-13 | Disposition: A | Discharge status: Home / Self Care | Payer: 59 | Source: Ambulatory Visit | Attending: Rheumatology | Admitting: Rheumatology

## 2010-12-13 ENCOUNTER — Other Ambulatory Visit (HOSPITAL_COMMUNITY): Payer: Self-pay | Admitting: Rheumatology

## 2010-12-13 DIAGNOSIS — D869 Sarcoidosis, unspecified: Secondary | ICD-10-CM

## 2011-02-09 ENCOUNTER — Other Ambulatory Visit: Payer: Self-pay | Admitting: Internal Medicine

## 2011-02-14 ENCOUNTER — Other Ambulatory Visit: Payer: Self-pay | Admitting: Internal Medicine

## 2011-02-14 DIAGNOSIS — M797 Fibromyalgia: Secondary | ICD-10-CM

## 2011-02-14 MED ORDER — MORPHINE SULFATE ER BEADS 90 MG PO CP24: 90.0000 mg | ORAL_CAPSULE | Freq: Every day | ORAL | Status: AC

## 2011-02-14 MED ORDER — MORPHINE SULFATE ER BEADS 90 MG PO CP24: 90.0000 mg | ORAL_CAPSULE | Freq: Every day | ORAL | Status: DC

## 2011-02-14 NOTE — Telephone Encounter (Signed)
Pt requesting refill on Avinza 90mg .

## 2011-02-14 NOTE — Telephone Encounter (Signed)
PT INFORMED READY FOR PICK UP.

## 2011-02-21 ENCOUNTER — Telehealth: Payer: Self-pay | Admitting: *Deleted

## 2011-02-21 MED ORDER — AMPHETAMINE-DEXTROAMPHET ER 30 MG PO CP24: 30.0000 mg | ORAL_CAPSULE | ORAL | Status: DC

## 2011-02-21 NOTE — Telephone Encounter (Signed)
rx called in, adderall rx's up front ready for p/u, pt aware

## 2011-02-21 NOTE — Telephone Encounter (Signed)
Pt is having sinus pressure, scratchy throat, yellow mucus, swollen glands.  Pt is going out of town tomorrow and would like something called in.  [facility] [address].  Also needs a refill on her Adderall XR 30mg 

## 2011-02-21 NOTE — Telephone Encounter (Signed)
Per dr jenkins-may have z pack 

## 2011-02-28 ENCOUNTER — Ambulatory Visit (INDEPENDENT_AMBULATORY_CARE_PROVIDER_SITE_OTHER): Payer: 59 | Admitting: Internal Medicine

## 2011-02-28 ENCOUNTER — Encounter: Payer: Self-pay | Admitting: Internal Medicine

## 2011-02-28 VITALS — BP 110/74 | Temp 98.3°F | Ht 66.0 in | Wt 138.0 lb

## 2011-02-28 DIAGNOSIS — M797 Fibromyalgia: Secondary | ICD-10-CM

## 2011-02-28 DIAGNOSIS — IMO0001 Reserved for inherently not codable concepts without codable children: Secondary | ICD-10-CM

## 2011-02-28 DIAGNOSIS — G8929 Other chronic pain: Secondary | ICD-10-CM

## 2011-02-28 DIAGNOSIS — M25549 Pain in joints of unspecified hand: Secondary | ICD-10-CM

## 2011-02-28 DIAGNOSIS — M25541 Pain in joints of right hand: Secondary | ICD-10-CM

## 2011-02-28 NOTE — Progress Notes (Signed)
Subjective:    Patient ID: Rachel Duran, female    DOB: Oct 29, 1955, 55 y.o.   MRN: [id number]  HPI Neck pain, Epidural from Dr Ollen Bowl July... 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    Review of Systems  Constitutional: Negative for activity change, appetite change and fatigue.  HENT: Negative for ear pain, congestion, neck pain, postnasal drip and sinus pressure.   Eyes: Negative for redness and visual disturbance.  Respiratory: Negative for cough, shortness of breath and wheezing.   Gastrointestinal: Negative for abdominal pain and abdominal distention.  Genitourinary: Negative for dysuria, frequency and menstrual problem.  Musculoskeletal: Positive for myalgias, joint swelling and arthralgias.  Skin: Negative for rash and wound.  Neurological: Negative for dizziness, weakness and headaches.  Hematological: Negative for adenopathy. Does not bruise/bleed easily.  Psychiatric/Behavioral: Negative for sleep disturbance and decreased concentration.   Past Medical History  Diagnosis Date  . Disorders of porphyrin metabolism   . Irritable bowel syndrome   . Fibromyalgia   . Arthritis   . GERD (gastroesophageal reflux disease)   . Depression   . Allergy   . Chronic pain    Past Surgical History  Procedure Date  . Cervical fusion   . Cervical lamination   . Cystoscopy   . Hernia repair   . Nasal sinus surgery   . Gyn surgery   . Hysteroscopy with ablation     reports that she has never smoked. She has never used smokeless tobacco. She reports that she does not drink alcohol or use illicit drugs. family history includes Esophageal cancer in her father and Heart disease in her father. Allergies  Allergen Reactions  . Codeine Phosphate     REACTION: unspecified  .  Meperidine Hcl     REACTION: red skin  . Sulfamethoxazole     REACTION: unspecified        Objective:   Physical Exam  Constitutional: She is oriented to person, place, and time. She appears well-developed and well-nourished. No distress.  HENT:  Head: Normocephalic and atraumatic.  Right Ear: External ear normal.  Left Ear: External ear normal.  Nose: Nose normal.  Mouth/Throat: Oropharynx is clear and moist.  Eyes: Conjunctivae and EOM are normal. Pupils are equal, round, and reactive to light.  Neck: Normal range of motion. Neck supple. No JVD present. No tracheal deviation present. No thyromegaly present.  Cardiovascular: Normal rate, regular rhythm, normal heart sounds and intact distal pulses.   No murmur heard. Pulmonary/Chest: Effort normal and breath sounds normal. She has no wheezes. She exhibits no tenderness.  Abdominal: Soft. Bowel sounds are normal.  Musculoskeletal: Normal range of motion. She exhibits no edema and no tenderness.  Lymphadenopathy:    She has no cervical adenopathy.  Neurological: She is alert and oriented to person, place, and time. She has normal reflexes. No cranial nerve deficit.  Skin: Skin is warm and dry. She is not diaphoretic.  Psychiatric: She has a normal mood and affect. Her behavior is normal.          Assessment & Plan:  Patient has a complex patient who is followed for posterior cutanea tarda anemia of chronic disease weight notes severe fibromyalgia pain and known degenerative joint disease.  She is followed by orthopedics rheumatology and os we manage  her pain medications with her other physicians.  Today we discussed her current state of pain control the fact on her chronic depression and anxiety as well as reviewed for any worsening of her porphyria or her raynauds .  I have spent more than 30 minutes examining this patient face-to-face of which over half was spent in counseling

## 2011-03-20 ENCOUNTER — Other Ambulatory Visit: Payer: Self-pay | Admitting: Internal Medicine

## 2011-04-07 ENCOUNTER — Other Ambulatory Visit: Payer: Self-pay | Admitting: Internal Medicine

## 2011-04-15 LAB — POCT I-STAT 4, (NA,K, GLUC, HGB,HCT)
Glucose, Bld: 94
Operator id: 268271

## 2011-04-17 ENCOUNTER — Other Ambulatory Visit: Payer: Self-pay | Admitting: Internal Medicine

## 2011-04-18 ENCOUNTER — Other Ambulatory Visit: Payer: Self-pay | Admitting: *Deleted

## 2011-04-18 MED ORDER — ESZOPICLONE 3 MG PO TABS: 3.0000 mg | ORAL_TABLET | Freq: Every day | ORAL | Status: DC

## 2011-05-04 ENCOUNTER — Encounter: Payer: Self-pay | Admitting: Internal Medicine

## 2011-05-04 ENCOUNTER — Ambulatory Visit (INDEPENDENT_AMBULATORY_CARE_PROVIDER_SITE_OTHER): Payer: 59 | Admitting: Internal Medicine

## 2011-05-04 VITALS — BP 110/70 | HR 72 | Temp 98.2°F | Resp 16 | Ht 66.5 in | Wt 140.0 lb

## 2011-05-04 DIAGNOSIS — M109 Gout, unspecified: Secondary | ICD-10-CM

## 2011-05-04 DIAGNOSIS — IMO0001 Reserved for inherently not codable concepts without codable children: Secondary | ICD-10-CM

## 2011-05-04 DIAGNOSIS — Z23 Encounter for immunization: Secondary | ICD-10-CM

## 2011-05-04 DIAGNOSIS — M130 Polyarthritis, unspecified: Secondary | ICD-10-CM

## 2011-05-04 LAB — BASIC METABOLIC PANEL
BUN: 10 mg/dL (ref 6–23)
CO2: 29 mEq/L (ref 19–32)
Calcium: 9.1 mg/dL (ref 8.4–10.5)
Glucose, Bld: 103 mg/dL — ABNORMAL HIGH (ref 70–99)
Potassium: 4.2 mEq/L (ref 3.5–5.1)
Sodium: 139 mEq/L (ref 135–145)

## 2011-05-04 MED ORDER — MORPHINE SULFATE ER BEADS 90 MG PO CP24: 90.0000 mg | ORAL_CAPSULE | Freq: Every day | ORAL | Status: AC

## 2011-05-04 MED ORDER — AMPHETAMINE-DEXTROAMPHET ER 30 MG PO CP24: 30.0000 mg | ORAL_CAPSULE | ORAL | Status: DC

## 2011-05-04 MED ORDER — MORPHINE SULFATE ER BEADS 90 MG PO CP24
90.0000 mg | ORAL_CAPSULE | Freq: Every day | ORAL | Status: DC
Start: 2011-05-04 — End: 2011-05-04

## 2011-05-04 MED ORDER — MORPHINE SULFATE ER BEADS 90 MG PO CP24: 90.0000 mg | ORAL_CAPSULE | Freq: Every day | ORAL | Status: DC

## 2011-05-04 NOTE — Progress Notes (Signed)
  Subjective:    Patient ID: Rachel Duran, female    DOB: 07-21-1956, 55 y.o.   MRN: [id number]  HPI Continues to see the rheumatologist Hip pain increased with exercise Working out in the pool and walking Fibromyalgia flair Pain medications needed Great toe inflamed that responded to IBU   Review of Systems  Constitutional: Negative for activity change, appetite change and fatigue.  HENT: Negative for ear pain, congestion, neck pain, postnasal drip and sinus pressure.   Eyes: Negative for redness and visual disturbance.  Respiratory: Negative for cough, shortness of breath and wheezing.   Gastrointestinal: Negative for abdominal pain and abdominal distention.  Genitourinary: Negative for dysuria, frequency and menstrual problem.  Musculoskeletal: Positive for myalgias, back pain, joint swelling and gait problem. Negative for arthralgias.  Skin: Negative for rash and wound.  Neurological: Negative for dizziness, weakness and headaches.  Hematological: Negative for adenopathy. Does not bruise/bleed easily.  Psychiatric/Behavioral: Negative for disturbed wake/sleep cycle and decreased concentration.       Objective:   Physical Exam  Nursing note and vitals reviewed. Constitutional: She is oriented to person, place, and time. She appears well-developed and well-nourished. No distress.  HENT:  Head: Normocephalic and atraumatic.  Right Ear: External ear normal.  Left Ear: External ear normal.  Nose: Nose normal.  Mouth/Throat: Oropharynx is clear and moist.  Eyes: Conjunctivae normal and EOM are normal. Pupils are equal, round, and reactive to light.  Neck: Normal range of motion. Neck supple. No JVD present. No tracheal deviation present. No thyromegaly present.  Cardiovascular: Normal rate, regular rhythm, normal heart sounds and intact distal pulses.   No murmur heard. Pulmonary/Chest: Effort normal and breath sounds normal. She has no wheezes. She exhibits no tenderness.    Abdominal: Soft. Bowel sounds are normal. She exhibits distension.  Musculoskeletal: Normal range of motion. She exhibits edema and tenderness.  Lymphadenopathy:    She has no cervical adenopathy.  Neurological: She is alert and oriented to person, place, and time. She has normal reflexes. No cranial nerve deficit.  Skin: Skin is warm and dry. She is not diaphoretic.  Psychiatric: She has a normal mood and affect. Her behavior is normal.          Assessment & Plan:  Refill of pain medication per protocol urged patient to continue with rheumatologist. Also has followup with urology for interstitial cystitis

## 2011-05-04 NOTE — Patient Instructions (Signed)
Back Exercises Back exercises help treat and prevent back injuries. The goal of back exercises is to increase the strength of your abdominal and back muscles and the flexibility of your back. These exercises should be started when you no longer have back pain. Back exercises include: 1. Pelvic Tilt - Lie on your back with your knees bent. Tilt your pelvis until the lower part of your back is against the floor. Hold this position 5-10 sec and repeat 5-10 times.  2. Knee to Chest - Pull first one knee up against your chest and hold for 20-30 seconds, repeat this with the other knee, and then both knees. This may be done with the other leg straight or bent, whichever feels better.  3. Sit-Ups or Curl-Ups - Bend your knees 90 degrees. Start with tilting your pelvis, and do a partial, slow sit-up, lifting your trunk only 30-45 degrees off the floor. Take at least 2-3 sec for each sit-up. Do not do sit-ups with your knees out straight. If partial sit-ups are difficult, simply do the above but with only tightening your abdominal muscles and holding it as directed.  4. Hip-Lift - Lie on your back with your knees flexed 90 degrees. Push down with your feet and shoulders as you raise your hips a couple inches off the floor; hold for 10 sec, repeat 5-10 times.  5. Back arches - Lie on your stomach, propping yourself up on bent elbows. Slowly press on your hands, causing an arch in your low back. Repeat 3-5 times. Any initial stiffness and discomfort should lessen with repetition over time.  6. Shoulder-Lifts - Lie face down with arms beside your body. Keep hips and torso pressed to floor as you slowly lift your head and shoulders off the floor.  Do not overdo your exercises, especially in the beginning. Exercises may cause you some mild back discomfort which lasts for a few minutes; however, if the pain is more severe, or lasts for more than 15 minutes, do not continue exercises until you see your caregiver.  Improvement with exercise therapy for back problems is slow.  See your caregivers for assistance with developing a proper back exercise program. Document Released: 08/18/2004 Document Re-Released: 10/07/2008 [employer] Patient Information 2011 [employer]. 

## 2011-05-09 ENCOUNTER — Other Ambulatory Visit: Payer: Self-pay | Admitting: Internal Medicine

## 2011-05-16 ENCOUNTER — Other Ambulatory Visit: Payer: Self-pay | Admitting: Internal Medicine

## 2011-06-04 DIAGNOSIS — K589 Irritable bowel syndrome without diarrhea: Secondary | ICD-10-CM | POA: Insufficient documentation

## 2011-06-04 DIAGNOSIS — M6289 Other specified disorders of muscle: Secondary | ICD-10-CM | POA: Insufficient documentation

## 2011-06-05 ENCOUNTER — Other Ambulatory Visit: Payer: Self-pay | Admitting: Internal Medicine

## 2011-06-26 ENCOUNTER — Other Ambulatory Visit: Payer: Self-pay | Admitting: Internal Medicine

## 2011-07-24 ENCOUNTER — Other Ambulatory Visit: Payer: Self-pay | Admitting: Internal Medicine

## 2011-07-27 ENCOUNTER — Other Ambulatory Visit: Payer: Self-pay | Admitting: Internal Medicine

## 2011-07-29 ENCOUNTER — Telehealth: Payer: Self-pay | Admitting: *Deleted

## 2011-07-29 MED ORDER — CIPROFLOXACIN HCL 500 MG PO TABS: 500.0000 mg | ORAL_TABLET | Freq: Two times a day (BID) | ORAL | Status: AC

## 2011-07-29 NOTE — Telephone Encounter (Signed)
Pt has frequent urination, burning.  Can't come in for a urine [place] cause she moved to [place] and she is leaving to go to [place].  Pt wants Dr Lovell Sheehan to call her in something [facility] [address]

## 2011-07-29 NOTE — Telephone Encounter (Signed)
May have cipro 500 bid for 5 days per dr Lovell Sheehan

## 2011-07-29 NOTE — Telephone Encounter (Signed)
Thanks cindy 

## 2011-07-29 NOTE — Telephone Encounter (Signed)
rx sent in electronically, pt aware 

## 2011-08-02 ENCOUNTER — Other Ambulatory Visit: Payer: Self-pay | Admitting: Internal Medicine

## 2011-08-04 ENCOUNTER — Ambulatory Visit (INDEPENDENT_AMBULATORY_CARE_PROVIDER_SITE_OTHER): Payer: 59 | Admitting: Internal Medicine

## 2011-08-04 VITALS — BP 110/70 | HR 72 | Temp 98.6°F | Resp 16 | Ht 66.5 in | Wt 145.0 lb

## 2011-08-04 DIAGNOSIS — IMO0001 Reserved for inherently not codable concepts without codable children: Secondary | ICD-10-CM

## 2011-08-04 DIAGNOSIS — N3010 Interstitial cystitis (chronic) without hematuria: Secondary | ICD-10-CM

## 2011-08-04 DIAGNOSIS — I8390 Asymptomatic varicose veins of unspecified lower extremity: Secondary | ICD-10-CM

## 2011-08-04 DIAGNOSIS — M797 Fibromyalgia: Secondary | ICD-10-CM

## 2011-08-04 DIAGNOSIS — K219 Gastro-esophageal reflux disease without esophagitis: Secondary | ICD-10-CM

## 2011-08-04 MED ORDER — PANTOPRAZOLE SODIUM 40 MG PO TBEC: 40.0000 mg | DELAYED_RELEASE_TABLET | Freq: Every day | ORAL | Status: DC

## 2011-08-04 MED ORDER — PHENAZOPYRIDINE-BUTABARB-HYOSC 150-15-0.3 MG PO TABS: 1.0000 | ORAL_TABLET | Freq: Three times a day (TID) | ORAL | Status: DC | PRN

## 2011-08-04 NOTE — Patient Instructions (Signed)
The patient is instructed to continue all medications as prescribed. Schedule followup with check out clerk upon leaving the clinic  

## 2011-08-04 NOTE — Progress Notes (Signed)
Subjective:    Patient ID: Rachel Duran, female    DOB: 10-29-1955, 56 y.o.   MRN: [id number]  HPI increased IC problems Difficult to see Dr Logan Bores and has been off the Elmiron She has a need for an instillation The pyridium helps Her fibromyalgia is stable on her current medical regimen. She is still concerned about weight gain as a potential side effect of some of her antidepressant medications. She is compliant with her chronic pain medication contract.    Review of Systems  Constitutional: Positive for fatigue. Negative for activity change and appetite change.  HENT: Negative for ear pain, congestion, neck pain, postnasal drip and sinus pressure.   Eyes: Negative for redness and visual disturbance.  Respiratory: Negative for cough, shortness of breath and wheezing.   Gastrointestinal: Negative for abdominal pain and abdominal distention.  Genitourinary: Negative for dysuria, frequency and menstrual problem.  Musculoskeletal: Positive for myalgias, joint swelling and arthralgias.  Skin: Negative for rash and wound.  Neurological: Positive for weakness. Negative for dizziness and headaches.  Hematological: Negative for adenopathy. Does not bruise/bleed easily.  Psychiatric/Behavioral: Negative for sleep disturbance and decreased concentration.   Past Medical History  Diagnosis Date  . Disorders of porphyrin metabolism   . Irritable bowel syndrome   . Fibromyalgia   . Arthritis   . GERD (gastroesophageal reflux disease)   . Depression   . Allergy   . Chronic pain     History   Social History  . Marital Status: Married    Spouse Name: N/A    Number of Children: N/A  . Years of Education: N/A   Occupational History  . retired    Social History Main Topics  . Smoking status: Never Smoker   . Smokeless tobacco: Never Used  . Alcohol Use: No  . Drug Use: No  . Sexually Active: Not on file   Other Topics Concern  . Not on file   Social History Narrative  . No  narrative on file    Past Surgical History  Procedure Date  . Cervical fusion   . Cervical lamination   . Cystoscopy   . Hernia repair   . Nasal sinus surgery   . Gyn surgery   . Hysteroscopy with ablation     Family History  Problem Relation Age of Onset  . Heart disease Father   . Esophageal cancer Father     Allergies  Allergen Reactions  . Codeine Phosphate     REACTION: unspecified  . Meperidine Hcl     REACTION: red skin  . Sulfamethoxazole     REACTION: unspecified    Current Outpatient Prescriptions on File Prior to Visit  Medication Sig Dispense Refill  . acyclovir (ZOVIRAX) 800 MG tablet TAKE ONE-HALF (1/2) TABLET DAILY  90 tablet  2  . ALPRAZolam (XANAX) 1 MG tablet Take 1 mg by mouth 3 (three) times daily as needed.        Marland Kitchen amLODipine (NORVASC) 5 MG tablet Take 5 mg by mouth daily.        . ARIPiprazole (ABILIFY) 10 MG tablet Take 10 mg by mouth daily.        Marland Kitchen aspirin 81 MG tablet Take 81 mg by mouth daily.        . baclofen (LIORESAL) 10 MG tablet Take 10 mg by mouth.        . Calcium Carbonate-Vitamin D (CALCIUM-VITAMIN D) 500-200 MG-UNIT per tablet Take 1 tablet by mouth 3 (three) times daily  with meals.        . Cholecalciferol (D 400 PO) Take by mouth.        . cyanocobalamin (COBAL-1000) 1000 MCG/ML injection Inject 1,000 mcg into the muscle every 30 (thirty) days.        Marland Kitchen desipramine (NOPRAMIN) 100 MG tablet Take by mouth. 1-4 qhs prn        . diazepam (DIASTAT) 2.5 MG GEL Place vaginally as needed.        . etodolac (LODINE) 400 MG tablet Take 400 mg by mouth 2 (two) times daily as needed.        . folic acid (FOLVITE) 1 MG tablet Take 2 mg by mouth daily.        . hydroxychloroquine (PLAQUENIL) 200 MG tablet Take by mouth 2 (two) times daily.        Marland Kitchen lamoTRIgine (LAMICTAL) 150 MG tablet Take 150 mg by mouth 2 (two) times daily.        Marland Kitchen lidocaine (LIDODERM) 5 % Place 3 patches onto the skin daily. Remove & Discard patch within 12 hours or as  directed by MD       . methotrexate (RHEUMATREX) 2.5 MG tablet Take by mouth. 8 tabs per week       . pentosan polysulfate (ELMIRON) 100 MG capsule Take 100 mg by mouth. 2 by mouth twice a day        . pregabalin (LYRICA) 150 MG capsule Take 150 mg by mouth 3 (three) times daily.        . Triamcinolone Acetonide (NASACORT AQ NA) 2 sprays by Nasal route daily.          BP 110/70  Pulse 72  Temp 98.6 F (37 C)  Resp 16  Ht 5' 6.5" (1.689 m)  Wt 145 lb (65.772 kg)  BMI 23.05 kg/m2       Objective:   Physical Exam  Constitutional: She is oriented to person, place, and time. She appears well-developed and well-nourished. No distress.  HENT:  Head: Normocephalic and atraumatic.  Right Ear: External ear normal.  Left Ear: External ear normal.  Nose: Nose normal.  Mouth/Throat: Oropharynx is clear and moist.  Eyes: Conjunctivae and EOM are normal. Pupils are equal, round, and reactive to light.  Neck: Normal range of motion. Neck supple. No JVD present. No tracheal deviation present. No thyromegaly present.  Cardiovascular: Normal rate, regular rhythm, normal heart sounds and intact distal pulses.   No murmur heard. Pulmonary/Chest: Effort normal and breath sounds normal. She has no wheezes. She exhibits no tenderness.  Abdominal: Soft. Bowel sounds are normal. She exhibits distension. There is tenderness.  Musculoskeletal: She exhibits edema and tenderness.  Lymphadenopathy:    She has no cervical adenopathy.  Neurological: She is alert and oriented to person, place, and time. She has normal reflexes. No cranial nerve deficit.  Skin: Skin is warm and dry. She is not diaphoretic.  Psychiatric: She has a normal mood and affect. Her behavior is normal.          Assessment & Plan:   Discussion of her interstitial cystitis flare. 3 Will refill her Elmiron until she can have appointment with Dr. Logan Bores.  Her current fibromyalgia medications are stable and continued to be  moderately effective in controlling her pain and helping her function.  She continues on Plaquenil for her dermatological condition. Refills of medications were given as appropriate

## 2011-08-08 ENCOUNTER — Other Ambulatory Visit: Payer: Self-pay | Admitting: *Deleted

## 2011-08-08 ENCOUNTER — Other Ambulatory Visit: Payer: Self-pay | Admitting: Internal Medicine

## 2011-08-08 MED ORDER — MORPHINE SULFATE ER BEADS 90 MG PO CP24: 90.0000 mg | ORAL_CAPSULE | Freq: Every day | ORAL | Status: DC

## 2011-08-08 MED ORDER — MORPHINE SULFATE ER BEADS 90 MG PO CP24: 90.0000 mg | ORAL_CAPSULE | Freq: Every day | ORAL | Status: AC

## 2011-08-08 MED ORDER — AMPHETAMINE-DEXTROAMPHET ER 30 MG PO CP24: 30.0000 mg | ORAL_CAPSULE | ORAL | Status: DC

## 2011-08-08 MED ORDER — ONDANSETRON HCL 4 MG PO TABS: 4.0000 mg | ORAL_TABLET | Freq: Three times a day (TID) | ORAL | Status: DC | PRN

## 2011-08-08 NOTE — Telephone Encounter (Signed)
Pt req written script for Avinza Morphine Sulfate 90mg  and Adderall XR 30 mg.  Pt req to pick up asap.

## 2011-08-08 NOTE — Telephone Encounter (Signed)
printed

## 2011-08-22 ENCOUNTER — Telehealth: Payer: Self-pay | Admitting: Internal Medicine

## 2011-08-22 NOTE — Telephone Encounter (Signed)
Left message on machine Get cbc and fe level before deciding

## 2011-08-22 NOTE — Telephone Encounter (Signed)
Should she continue to be on her slow released iron pill? If so, she will need a refill called to [facility]--[address]---in [place]---phone-[phone]. Thanks.

## 2011-09-02 ENCOUNTER — Encounter: Payer: Self-pay | Admitting: Surgery

## 2011-09-05 ENCOUNTER — Encounter: Payer: Self-pay | Admitting: Surgery

## 2011-09-05 ENCOUNTER — Ambulatory Visit (INDEPENDENT_AMBULATORY_CARE_PROVIDER_SITE_OTHER): Payer: 59 | Admitting: Vascular Surgery

## 2011-09-05 ENCOUNTER — Ambulatory Visit (INDEPENDENT_AMBULATORY_CARE_PROVIDER_SITE_OTHER): Payer: 59 | Admitting: *Deleted

## 2011-09-05 VITALS — BP 128/82 | HR 81 | Resp 20 | Ht 66.5 in | Wt 145.0 lb

## 2011-09-05 DIAGNOSIS — I83893 Varicose veins of bilateral lower extremities with other complications: Secondary | ICD-10-CM | POA: Insufficient documentation

## 2011-09-05 NOTE — Progress Notes (Signed)
Subjective:     Patient ID: Rachel Duran, female   DOB: 1956/04/02, 56 y.o.   MRN: [id number]  HPI this 56 year old female was referred by Dr. Darryll Capers for possible venous disease in both lower extremities. She complains of discomfort in the thighs and calves (left greater than right) which worsened as the day goes back. She has minimal swelling which occurs. She has no history of varicose veins, stasis ulcers, bleeding, thrombophlebitis, or DVT. She is our last compression stockings. Occasionally her legs will awaken her at night with discomfort in that she walks around this will improve it. Her mother had vein strippings in both legs in the 1960s and her sister has had some injection therapy and she is concerned about the family history aspect.  Past Medical History  Diagnosis Date  . Disorders of porphyrin metabolism   . Irritable bowel syndrome   . Fibromyalgia   . Arthritis   . GERD (gastroesophageal reflux disease)   . Depression   . Allergy   . Chronic pain     History  Substance Use Topics  . Smoking status: Never Smoker   . Smokeless tobacco: Never Used  . Alcohol Use: No    Family History  Problem Relation Age of Onset  . Heart disease Father   . Esophageal cancer Father     Allergies  Allergen Reactions  . Codeine Phosphate     REACTION: unspecified  . Meperidine Hcl     REACTION: red skin  . Sulfamethoxazole     REACTION: unspecified    Current outpatient prescriptions:acyclovir (ZOVIRAX) 800 MG tablet, TAKE ONE-HALF (1/2) TABLET DAILY, Disp: 90 tablet, Rfl: 2;  ALPRAZolam (XANAX) 1 MG tablet, Take 1 mg by mouth 3 (three) times daily as needed.  , Disp: , Rfl: ;  AMITIZA 24 MCG capsule, TAKE 1 CAPSULE TWICE A DAY WITH FOOD, Disp: 60 capsule, Rfl: 0;  amLODipine (NORVASC) 5 MG tablet, Take 5 mg by mouth daily.  , Disp: , Rfl:  amphetamine-dextroamphetamine (ADDERALL XR) 30 MG 24 hr capsule, Take 1 capsule (30 mg total) by mouth every morning., Disp: 30  capsule, Rfl: 0;  ARIPiprazole (ABILIFY) 10 MG tablet, Take 10 mg by mouth daily.  , Disp: , Rfl: ;  aspirin 81 MG tablet, Take 81 mg by mouth daily.  , Disp: , Rfl: ;  baclofen (LIORESAL) 10 MG tablet, Take 10 mg by mouth.  , Disp: , Rfl:  Calcium Carbonate-Vitamin D (CALCIUM-VITAMIN D) 500-200 MG-UNIT per tablet, Take 1 tablet by mouth 3 (three) times daily with meals.  , Disp: , Rfl: ;  Cholecalciferol (D 400 PO), Take by mouth.  , Disp: , Rfl: ;  cyanocobalamin (COBAL-1000) 1000 MCG/ML injection, Inject 1,000 mcg into the muscle every 30 (thirty) days.  , Disp: , Rfl: ;  diazepam (DIASTAT) 2.5 MG GEL, Place vaginally as needed.  , Disp: , Rfl:  etodolac (LODINE) 400 MG tablet, Take 400 mg by mouth 2 (two) times daily as needed.  , Disp: , Rfl: ;  folic acid (FOLVITE) 1 MG tablet, Take 2 mg by mouth daily.  , Disp: , Rfl: ;  hydroxychloroquine (PLAQUENIL) 200 MG tablet, Take by mouth 2 (two) times daily.  , Disp: , Rfl: ;  lamoTRIgine (LAMICTAL) 150 MG tablet, Take 150 mg by mouth 2 (two) times daily.  , Disp: , Rfl:  lidocaine (LIDODERM) 5 %, Place 3 patches onto the skin daily. Remove & Discard patch within 12 hours or as  directed by MD , Disp: , Rfl: ;  methotrexate (RHEUMATREX) 2.5 MG tablet, Take by mouth. 8 tabs per week , Disp: , Rfl: ;  morphine (AVINZA) 90 MG 24 hr capsule, Take 1 capsule (90 mg total) by mouth daily., Disp: 30 capsule, Rfl: 0 ondansetron (ZOFRAN) 4 MG tablet, Take 1 tablet (4 mg total) by mouth every 8 (eight) hours as needed., Disp: 30 tablet, Rfl: 6;  pantoprazole (PROTONIX) 40 MG tablet, Take 1 tablet (40 mg total) by mouth daily., Disp: 30 tablet, Rfl: 3;  pentosan polysulfate (ELMIRON) 100 MG capsule, Take 100 mg by mouth. 2 by mouth twice a day  , Disp: , Rfl:  phenazopyridine-butabarbital-hyoscyamine (PHENAZOPYRIDINE PLUS) 150-15-0.3 MG tablet, Take 1 tablet by mouth 3 (three) times daily as needed., Disp: 60 tablet, Rfl: 3;  pregabalin (LYRICA) 150 MG capsule, Take 150 mg  by mouth 3 (three) times daily.  , Disp: , Rfl: ;  Triamcinolone Acetonide (NASACORT AQ NA), 2 sprays by Nasal route daily.  , Disp: , Rfl: ;  [facility] SLOW RELEASE IRON 143 (45 FE) MG TBCR, TAKE 1 TABLET EVERY DAY, Disp: 30 tablet, Rfl: 0 desipramine (NOPRAMIN) 100 MG tablet, Take by mouth. 1-4 qhs prn  , Disp: , Rfl: ;  Eszopiclone (ESZOPICLONE) 3 MG TABS, Take 1 tablet (3 mg total) by mouth at bedtime. Take immediately before bedtime, Disp: 30 tablet, Rfl: 5;  Ferrous Sulfate 47.5 MG TBCR, Take by mouth daily.  , Disp: , Rfl:   BP 128/82  Pulse 81  Resp 20  Ht 5' 6.5" (1.689 m)  Wt 145 lb (65.772 kg)  BMI 23.05 kg/m2  Body mass index is 23.05 kg/(m^2).         Review of Systems denies chest pain, dyspnea on exertion, PND, orthopnea. Does have a history of [name]. Also irritable bowel syndrome. Denies claudication, hemoptysis, or neurologic symptoms. All other systems are negative and complete review of system    Objective:   Physical Exam blood pressure 120/82 heart rate 81 respirations 20 Gen.-alert and oriented x3 in no apparent distress HEENT normal for age Lungs no rhonchi or wheezing Cardiovascular regular rhythm no murmurs carotid pulses 3+ palpable no bruits audible Abdomen soft nontender no palpable masses Musculoskeletal free of  major deformities Skin clear -no rashes Neurologic normal Lower extremities 3+ femoral and dorsalis pedis pulses palpable bilaterally with no edema There are some scattered spider veins in the left ankle distally and in the medial thigh distally and one in the proximal medial thigh on the left. No bulging varicosities are noted. There's no hyperpigmentation or ulceration noted.  Today I ordered bilateral venous duplex exam which I reviewed and interpreted. She has very mild reflux in the deep system bilaterally. There is no DVT. She has a few areas of reflux and short segments one at the left saphenofemoral junction and one at the right  sapheno-popliteal junction    Assessment:     No evidence of significant venous insufficiency Scattered spider veins    Plan:     We'll discuss sclerotherapy with patient and she would desire treatment for her spider veins No further evaluation is indicated

## 2011-09-13 ENCOUNTER — Other Ambulatory Visit: Payer: Self-pay | Admitting: Obstetrics and Gynecology

## 2011-09-13 DIAGNOSIS — Z1231 Encounter for screening mammogram for malignant neoplasm of breast: Secondary | ICD-10-CM

## 2011-09-19 NOTE — Procedures (Unsigned)
LOWER EXTREMITY VENOUS REFLUX EXAM  INDICATION:  Varicose veins.  EXAM:  Using color-flow imaging and pulse Doppler spectral analysis, the bilateral common femoral, femoral, popliteal, posterior tibial, greater and small saphenous veins were evaluated.  There is evidence suggesting mild deep venous insufficiency in the bilateral lower extremity.  The right saphenofemoral junction is competent.   The left saphenofemoral junction is incompetent with reflux of greater than 500 milliseconds. The left greater saphenous vein is not competent in the proximal thigh with reflux of greater than 500 milliseconds with the caliber as described below.  The right greater saphenous vein is competent.  The right proximal small saphenous vein demonstrates incompetency.  The left small saphenous vein is competent.  GSV Diameter (used if found to be incompetent only)                                           Right    Left Proximal Greater Saphenous Vein           cm       0.34 cm Proximal-to-mid-thigh                     cm       cm Mid thigh                                 cm       cm Mid-distal thigh                          cm       cm Distal thigh                              cm       cm Knee                                      cm       cm  SMALL SAPHENOUS VEIN RIGHT:  0.18 cm.  SMALL SAPHENOUS VEIN LEFT:  IMPRESSION: 1. Left greater saphenous vein is slightly incompetent with reflux of     >537milliseconds. 2. The left greater saphenous vein is not tortuous. 3. The deep venous system bilaterally is slightly incompetent with     reflux of  >552milliseconds. 4. The right small saphenous vein is not competent with reflux of     >567milliseconds. 5. No evidence of deep venous thrombosis bilaterally.  ___________________________________________ Annamarie Major, MD  SS/MEDQ  D:  09/05/2011  T:  09/05/2011  Job:  [id number]

## 2011-09-23 LAB — HM PAP SMEAR

## 2011-10-04 ENCOUNTER — Other Ambulatory Visit: Payer: Self-pay | Admitting: Internal Medicine

## 2011-10-05 ENCOUNTER — Ambulatory Visit (INDEPENDENT_AMBULATORY_CARE_PROVIDER_SITE_OTHER): Payer: 59 | Admitting: Internal Medicine

## 2011-10-05 ENCOUNTER — Encounter: Payer: Self-pay | Admitting: Internal Medicine

## 2011-10-05 VITALS — BP 130/80 | HR 72 | Temp 98.3°F | Resp 16 | Ht 66.5 in | Wt 146.0 lb

## 2011-10-05 DIAGNOSIS — K219 Gastro-esophageal reflux disease without esophagitis: Secondary | ICD-10-CM

## 2011-10-05 DIAGNOSIS — D219 Benign neoplasm of connective and other soft tissue, unspecified: Secondary | ICD-10-CM

## 2011-10-05 DIAGNOSIS — IMO0001 Reserved for inherently not codable concepts without codable children: Secondary | ICD-10-CM

## 2011-10-05 DIAGNOSIS — M549 Dorsalgia, unspecified: Secondary | ICD-10-CM

## 2011-10-05 MED ORDER — AMPHETAMINE-DEXTROAMPHET ER 30 MG PO CP24: 30.0000 mg | ORAL_CAPSULE | ORAL | Status: DC

## 2011-10-05 MED ORDER — DULOXETINE HCL 30 MG PO CPEP: 30.0000 mg | ORAL_CAPSULE | Freq: Every day | ORAL | Status: DC

## 2011-10-05 MED ORDER — PANTOPRAZOLE SODIUM 40 MG PO TBEC: 40.0000 mg | DELAYED_RELEASE_TABLET | Freq: Every day | ORAL | Status: DC

## 2011-10-05 MED ORDER — MORPHINE SULFATE ER BEADS 90 MG PO CP24: 90.0000 mg | ORAL_CAPSULE | Freq: Every day | ORAL | Status: AC

## 2011-10-05 MED ORDER — MORPHINE SULFATE ER BEADS 90 MG PO CP24: 90.0000 mg | ORAL_CAPSULE | Freq: Every day | ORAL | Status: DC

## 2011-10-05 NOTE — Patient Instructions (Addendum)
The patient is instructed to continue all medications as prescribed. Schedule followup with check out clerk upon leaving the clinic Take 1/4 of the abilify for now with the cymbalta

## 2011-10-05 NOTE — Progress Notes (Signed)
Subjective:    Patient ID: Rachel Duran, female    DOB: 1956/05/27, 56 y.o.   MRN: [id number]  HPI  The patient has been in one on one water aerobic therapy  Increased pain after therapy but then has increased range of motion after therapy Fibromyalgia pain stable but has a daily "high pain level" and is concerned that she is developing tolerance to the medications. The pt's interstitial cystitis is stable She is concerned about depression   Review of Systems  Constitutional: Negative for activity change, appetite change and fatigue.  HENT: Negative for ear pain, congestion, neck pain, postnasal drip and sinus pressure.   Eyes: Negative for redness and visual disturbance.  Respiratory: Negative for cough, shortness of breath and wheezing.   Gastrointestinal: Negative for abdominal pain and abdominal distention.  Genitourinary: Negative for dysuria, frequency and menstrual problem.  Musculoskeletal: Negative for myalgias, joint swelling and arthralgias.  Skin: Negative for rash and wound.  Neurological: Negative for dizziness, weakness and headaches.  Hematological: Negative for adenopathy. Does not bruise/bleed easily.  Psychiatric/Behavioral: Negative for sleep disturbance and decreased concentration.   Past Medical History  Diagnosis Date  . Disorders of porphyrin metabolism   . Irritable bowel syndrome   . Fibromyalgia   . Arthritis   . GERD (gastroesophageal reflux disease)   . Depression   . Allergy   . Chronic pain     History   Social History  . Marital Status: Married    Spouse Name: N/A    Number of Children: N/A  . Years of Education: N/A   Occupational History  . retired    Social History Main Topics  . Smoking status: Never Smoker   . Smokeless tobacco: Never Used  . Alcohol Use: No  . Drug Use: No  . Sexually Active: Not on file   Other Topics Concern  . Not on file   Social History Narrative  . No narrative on file    Past Surgical  History  Procedure Date  . Cervical fusion   . Cervical lamination   . Cystoscopy   . Hernia repair   . Nasal sinus surgery   . Gyn surgery   . Hysteroscopy with ablation     Family History  Problem Relation Age of Onset  . Heart disease Father   . Esophageal cancer Father     Allergies  Allergen Reactions  . Codeine Phosphate     REACTION: unspecified  . Meperidine Hcl     REACTION: red skin  . Sulfamethoxazole     REACTION: unspecified    Current Outpatient Prescriptions on File Prior to Visit  Medication Sig Dispense Refill  . acyclovir (ZOVIRAX) 800 MG tablet TAKE ONE-HALF (1/2) TABLET DAILY  90 tablet  2  . ALPRAZolam (XANAX) 1 MG tablet Take 1 mg by mouth 3 (three) times daily as needed.        . AMITIZA 24 MCG capsule TAKE 1 CAPSULE TWICE A DAY WITH FOOD  60 capsule  0  . amLODipine (NORVASC) 5 MG tablet Take 5 mg by mouth daily.        . ARIPiprazole (ABILIFY) 10 MG tablet Take 10 mg by mouth daily.        Marland Kitchen aspirin 81 MG tablet Take 81 mg by mouth daily.        . baclofen (LIORESAL) 10 MG tablet Take 10 mg by mouth.        . Calcium Carbonate-Vitamin D (CALCIUM-VITAMIN D) 500-200  MG-UNIT per tablet Take 1 tablet by mouth 3 (three) times daily with meals.        . Cholecalciferol (D 400 PO) Take by mouth.        . cyanocobalamin (COBAL-1000) 1000 MCG/ML injection Inject 1,000 mcg into the muscle every 30 (thirty) days.        Marland Kitchen desipramine (NOPRAMIN) 100 MG tablet Take by mouth. 1-4 qhs prn        . diazepam (DIASTAT) 2.5 MG GEL Place vaginally as needed.        . Eszopiclone (ESZOPICLONE) 3 MG TABS Take 1 tablet (3 mg total) by mouth at bedtime. Take immediately before bedtime  30 tablet  5  . etodolac (LODINE) 400 MG tablet Take 400 mg by mouth 2 (two) times daily as needed.        . folic acid (FOLVITE) 1 MG tablet Take 2 mg by mouth daily.        . hydroxychloroquine (PLAQUENIL) 200 MG tablet Take by mouth 2 (two) times daily.        Marland Kitchen lamoTRIgine (LAMICTAL)  150 MG tablet Take 150 mg by mouth 2 (two) times daily.        Marland Kitchen lidocaine (LIDODERM) 5 % Place 3 patches onto the skin daily. Remove & Discard patch within 12 hours or as directed by MD       . methotrexate (RHEUMATREX) 2.5 MG tablet Take by mouth. 8 tabs per week       . ondansetron (ZOFRAN) 4 MG tablet Take 1 tablet (4 mg total) by mouth every 8 (eight) hours as needed.  30 tablet  6  . pentosan polysulfate (ELMIRON) 100 MG capsule Take 100 mg by mouth. 2 by mouth twice a day        . phenazopyridine-butabarbital-hyoscyamine (PHENAZOPYRIDINE PLUS) 150-15-0.3 MG tablet Take 1 tablet by mouth 3 (three) times daily as needed.  60 tablet  3  . pregabalin (LYRICA) 150 MG capsule Take 150 mg by mouth 3 (three) times daily.        . Triamcinolone Acetonide (NASACORT AQ NA) 2 sprays by Nasal route daily.          BP 130/80  Pulse 72  Temp 98.3 F (36.8 C)  Resp 16  Ht 5' 6.5" (1.689 m)  Wt 146 lb (66.225 kg)  BMI 23.21 kg/m2        Objective:   Physical Exam  Nursing note and vitals reviewed. Constitutional: She is oriented to person, place, and time. She appears well-developed and well-nourished. No distress.  HENT:  Head: Normocephalic and atraumatic.  Right Ear: External ear normal.  Left Ear: External ear normal.  Nose: Nose normal.  Mouth/Throat: Oropharynx is clear and moist.  Eyes: Conjunctivae and EOM are normal. Pupils are equal, round, and reactive to light.  Neck: Normal range of motion. Neck supple. No JVD present. No tracheal deviation present. No thyromegaly present.  Cardiovascular: Normal rate, regular rhythm, normal heart sounds and intact distal pulses.   No murmur heard. Pulmonary/Chest: Effort normal and breath sounds normal. She has no wheezes. She exhibits no tenderness.  Abdominal: Soft. Bowel sounds are normal.  Musculoskeletal: Normal range of motion. She exhibits no edema and no tenderness.  Lymphadenopathy:    She has no cervical adenopathy.    Neurological: She is alert and oriented to person, place, and time. She has normal reflexes. No cranial nerve deficit.  Skin: Skin is warm and dry. She is not diaphoretic.  Psychiatric:  She has a normal mood and affect. Her behavior is normal.          Assessment & Plan:  We will do a low dose of Cymbalta trial 40 mg by mouth daily and cut the Abilify to 2.5 mg by mouth daily and monitor her fact she is desirous to back off of the long-acting morphine if possible but would recommend a very slow titration once we were able to titrate up on the Cymbalta if it is effective.  She is stable from the standpoint of her interstitial cystitis and is followed by urology her ADD and pain medications a refill today per protocol

## 2011-10-10 ENCOUNTER — Telehealth: Payer: Self-pay

## 2011-10-10 NOTE — Telephone Encounter (Signed)
Per Dr. Lovell Sheehan advised pt to discontinue medication.  Pt states she understands.

## 2011-10-10 NOTE — Telephone Encounter (Signed)
Pt called and states she started Cymbalta on Sunday and began feeling drowsy and within 5 hours pt was having urinary retention.  Pt states she did not take medication today and wanted to make Dr. Lovell Sheehan aware.  Pls advise.

## 2011-10-12 ENCOUNTER — Ambulatory Visit
Admission: RE | Admit: 2011-10-12 | Discharge: 2011-10-12 | Disposition: A | Discharge status: Home / Self Care | Payer: 59 | Source: Ambulatory Visit | Attending: Obstetrics and Gynecology | Admitting: Obstetrics and Gynecology

## 2011-10-12 DIAGNOSIS — Z1231 Encounter for screening mammogram for malignant neoplasm of breast: Secondary | ICD-10-CM

## 2011-10-18 ENCOUNTER — Other Ambulatory Visit: Payer: Self-pay | Admitting: *Deleted

## 2011-10-18 MED ORDER — ESZOPICLONE 3 MG PO TABS: 3.0000 mg | ORAL_TABLET | Freq: Every day | ORAL | Status: DC

## 2011-10-19 ENCOUNTER — Encounter: Payer: Self-pay | Admitting: Internal Medicine

## 2011-11-07 ENCOUNTER — Ambulatory Visit: Payer: 59 | Admitting: Internal Medicine

## 2011-11-09 ENCOUNTER — Ambulatory Visit: Payer: 59 | Admitting: Internal Medicine

## 2011-11-15 ENCOUNTER — Encounter: Payer: Self-pay | Admitting: Internal Medicine

## 2011-11-15 ENCOUNTER — Ambulatory Visit (INDEPENDENT_AMBULATORY_CARE_PROVIDER_SITE_OTHER): Payer: 59 | Admitting: Internal Medicine

## 2011-11-15 VITALS — BP 134/80 | HR 72 | Temp 98.6°F | Resp 16 | Ht 66.5 in | Wt 144.0 lb

## 2011-11-15 DIAGNOSIS — IMO0001 Reserved for inherently not codable concepts without codable children: Secondary | ICD-10-CM

## 2011-11-15 DIAGNOSIS — K589 Irritable bowel syndrome without diarrhea: Secondary | ICD-10-CM

## 2011-11-15 DIAGNOSIS — M797 Fibromyalgia: Secondary | ICD-10-CM

## 2011-11-15 MED ORDER — MORPHINE SULFATE ER BEADS 120 MG PO CP24: 120.0000 mg | ORAL_CAPSULE | Freq: Every day | ORAL | Status: AC

## 2011-11-15 MED ORDER — ESZOPICLONE 3 MG PO TABS: 3.0000 mg | ORAL_TABLET | Freq: Every day | ORAL | Status: DC

## 2011-11-15 NOTE — Progress Notes (Signed)
Subjective:    Patient ID: Rachel Duran, female    DOB: 16-Feb-1956, 56 y.o.   MRN: [id number]  HPI Patient is a 56 year old female who follows for fibromyalgia and porphyria  She has had a recent severe flare of her fibromyalgia with increased pain she was unable to tolerate the Cymbalta due to urinary retention.  She also appears to have a mild flare of the porphyria with increased erythema of the skin she does have moderate sun exposure and to walk 2 weeks ago and  travel to [place].  Increased pain with exercise. Pain control issues Had two cortisone injections in hips to control pain    Review of Systems  Constitutional: Negative for activity change, appetite change and fatigue.  HENT: Positive for sore throat, trouble swallowing and sinus pressure. Negative for ear pain, congestion, neck pain and postnasal drip.   Eyes: Negative for redness and visual disturbance.  Respiratory: Negative for cough, shortness of breath and wheezing.   Gastrointestinal: Negative for abdominal pain and abdominal distention.  Genitourinary: Negative for dysuria, frequency and menstrual problem.  Musculoskeletal: Positive for myalgias, back pain and joint swelling. Negative for arthralgias.  Skin: Positive for color change, pallor and rash. Negative for wound.  Neurological: Positive for weakness. Negative for dizziness and headaches.  Hematological: Negative for adenopathy. Does not bruise/bleed easily.  Psychiatric/Behavioral: Negative for sleep disturbance and decreased concentration.   Past Medical History  Diagnosis Date  . Disorders of porphyrin metabolism   . Irritable bowel syndrome   . Fibromyalgia   . Arthritis   . GERD (gastroesophageal reflux disease)   . Depression   . Allergy   . Chronic pain     History   Social History  . Marital Status: Married    Spouse Name: N/A    Number of Children: N/A  . Years of Education: N/A   Occupational History  . retired    Social  History Main Topics  . Smoking status: Never Smoker   . Smokeless tobacco: Never Used  . Alcohol Use: No  . Drug Use: No  . Sexually Active: Not on file   Other Topics Concern  . Not on file   Social History Narrative  . No narrative on file    Past Surgical History  Procedure Date  . Cervical fusion   . Cervical lamination   . Cystoscopy   . Hernia repair   . Nasal sinus surgery   . Gyn surgery   . Hysteroscopy with ablation     Family History  Problem Relation Age of Onset  . Heart disease Father   . Esophageal cancer Father     Allergies  Allergen Reactions  . Cymbalta (Duloxetine Hcl)     Urinary retention  . Codeine Phosphate     REACTION: unspecified  . Meperidine Hcl     REACTION: red skin  . Sulfamethoxazole     REACTION: unspecified    Current Outpatient Prescriptions on File Prior to Visit  Medication Sig Dispense Refill  . acyclovir (ZOVIRAX) 800 MG tablet TAKE ONE-HALF (1/2) TABLET DAILY  90 tablet  2  . ALPRAZolam (XANAX) 1 MG tablet Take 1 mg by mouth 3 (three) times daily as needed.        . AMITIZA 24 MCG capsule TAKE 1 CAPSULE TWICE A DAY WITH FOOD  60 capsule  0  . amLODipine (NORVASC) 5 MG tablet Take 5 mg by mouth daily.        Marland Kitchen amphetamine-dextroamphetamine (  ADDERALL XR) 30 MG 24 hr capsule Take 1 capsule (30 mg total) by mouth every morning.  30 capsule  0  . ARIPiprazole (ABILIFY) 10 MG tablet Take 10 mg by mouth daily.        Marland Kitchen aspirin 81 MG tablet Take 81 mg by mouth daily.        . baclofen (LIORESAL) 10 MG tablet Take 10 mg by mouth.        . Calcium Carbonate-Vitamin D (CALCIUM-VITAMIN D) 500-200 MG-UNIT per tablet Take 1 tablet by mouth 3 (three) times daily with meals.        . Cholecalciferol (D 400 PO) Take by mouth.        . cyanocobalamin (COBAL-1000) 1000 MCG/ML injection Inject 1,000 mcg into the muscle every 30 (thirty) days.        Marland Kitchen desipramine (NOPRAMIN) 100 MG tablet Take by mouth. 1-4 qhs prn        . diazepam  (DIASTAT) 2.5 MG GEL Place vaginally as needed.        . folic acid (FOLVITE) 1 MG tablet Take 2 mg by mouth daily.        . hydroxychloroquine (PLAQUENIL) 200 MG tablet Take by mouth 2 (two) times daily.        Marland Kitchen lamoTRIgine (LAMICTAL) 150 MG tablet Take 150 mg by mouth 2 (two) times daily.        Marland Kitchen lidocaine (LIDODERM) 5 % Place 3 patches onto the skin daily. Remove & Discard patch within 12 hours or as directed by MD       . methotrexate (RHEUMATREX) 2.5 MG tablet Take by mouth. 8 tabs per week       . ondansetron (ZOFRAN) 4 MG tablet Take 1 tablet (4 mg total) by mouth every 8 (eight) hours as needed.  30 tablet  6  . pantoprazole (PROTONIX) 40 MG tablet Take 1 tablet (40 mg total) by mouth daily.  90 tablet  3  . pentosan polysulfate (ELMIRON) 100 MG capsule Take 100 mg by mouth. 2 by mouth twice a day        . phenazopyridine-butabarbital-hyoscyamine (PHENAZOPYRIDINE PLUS) 150-15-0.3 MG tablet Take 1 tablet by mouth 3 (three) times daily as needed.  60 tablet  3  . pregabalin (LYRICA) 150 MG capsule Take 150 mg by mouth 3 (three) times daily.        . Triamcinolone Acetonide (NASACORT AQ NA) 2 sprays by Nasal route daily.        Marland Kitchen DISCONTD: [facility] SLOW RELEASE IRON 143 (45 FE) MG TBCR TAKE 1 TABLET EVERY DAY  30 tablet  0  . DISCONTD: Eszopiclone (ESZOPICLONE) 3 MG TABS Take 1 tablet (3 mg total) by mouth at bedtime. Take immediately before bedtime  30 tablet  0    BP 134/80  Pulse 72  Temp 98.6 F (37 C)  Resp 16  Ht 5' 6.5" (1.689 m)  Wt 144 lb (65.318 kg)  BMI 22.89 kg/m2        Objective:   Physical Exam  Nursing note and vitals reviewed. Constitutional: She is oriented to person, place, and time. She appears well-developed and well-nourished. No distress.  HENT:  Head: Normocephalic and atraumatic.  Right Ear: External ear normal.  Left Ear: External ear normal.  Nose: Nose normal.  Mouth/Throat: Oropharynx is clear and moist.  Eyes: Conjunctivae and EOM are normal.  Pupils are equal, round, and reactive to light.  Neck: Normal range of motion. Neck supple. No JVD  present. No tracheal deviation present. No thyromegaly present.  Cardiovascular: Normal rate, regular rhythm, normal heart sounds and intact distal pulses.   No murmur heard. Pulmonary/Chest: Effort normal and breath sounds normal. She has no wheezes. She exhibits no tenderness.  Abdominal: Soft. Bowel sounds are normal. There is tenderness.  Musculoskeletal: Normal range of motion. She exhibits edema and tenderness.  Lymphadenopathy:    She has no cervical adenopathy.  Neurological: She is alert and oriented to person, place, and time. She has normal reflexes. No cranial nerve deficit.  Skin: Skin is warm and dry. Rash noted. She is not diaphoretic. There is erythema. There is pallor.  Psychiatric: She has a normal mood and affect. Her behavior is normal.          Assessment & Plan:  We reviewed with the patient her recent consultation with rheumatology and the believe that she has had a severe flare of her fibromyalgia we counseled her not to attribute all of her symptoms to the medicine failure in the setting of such a flare.  She also had a reaction to the water aerobics in previous times she has done well with this kind of therapy therefore we discussed with the patient about resuming the water therapy after the fibromyalgia flare has subsided. We refilled the appropriate medications for pain control discussed her sleeping patterns and increased to the long-acting morphine 220 mg for one month period of time for the fibromyalgia flare with hopes of decreasing that dose back to 90 in 30 days

## 2011-11-15 NOTE — Patient Instructions (Signed)
The patient is instructed to continue all medications as prescribed. Schedule followup with check out clerk upon leaving the clinic  

## 2011-11-29 ENCOUNTER — Telehealth: Payer: Self-pay | Admitting: *Deleted

## 2011-11-29 NOTE — Telephone Encounter (Signed)
Pt cannot take the Avinza 120 mg. Capsule.  It  causes her to have too much constipation.  She needs to go back to the 90 mg plus 15 mg. For break thru pain.

## 2011-12-01 ENCOUNTER — Telehealth: Payer: Self-pay | Admitting: *Deleted

## 2011-12-01 NOTE — Telephone Encounter (Signed)
Cannot take take the increased doses of Avinza.  The 15 mg. Was too much/  Constipated about her Morphine cannot for contipation.  Will write a prescription?

## 2011-12-02 ENCOUNTER — Other Ambulatory Visit: Payer: Self-pay | Admitting: *Deleted

## 2011-12-02 MED ORDER — MORPHINE SULFATE 15 MG PO TABS: 15.0000 mg | ORAL_TABLET | Freq: Four times a day (QID) | ORAL | Status: AC | PRN

## 2011-12-02 MED ORDER — MORPHINE SULFATE 15 MG PO TABS: 15.0000 mg | ORAL_TABLET | Freq: Four times a day (QID) | ORAL | Status: DC | PRN

## 2011-12-02 NOTE — Telephone Encounter (Signed)
Per dr Yvonne Kendall go back to avinza 90 and add morphine ir 150#50- pt informed

## 2011-12-12 ENCOUNTER — Telehealth: Payer: Self-pay | Admitting: Internal Medicine

## 2011-12-12 NOTE — Telephone Encounter (Signed)
Pt called and had to rsc her June 3rd ov because they changed pts flight. Pt had to rsc to 02/15/12. Pt has 1 script remaining for morphine, so will need another script to last until next ov. Pt will call back when refill is needed.

## 2011-12-20 ENCOUNTER — Other Ambulatory Visit: Payer: Self-pay | Admitting: Internal Medicine

## 2011-12-20 MED ORDER — CYANOCOBALAMIN 1000 MCG/ML IJ SOLN: 1000.0000 ug | INTRAMUSCULAR | Status: DC

## 2011-12-20 MED ORDER — LUBIPROSTONE 24 MCG PO CAPS: 24.0000 ug | ORAL_CAPSULE | Freq: Every day | ORAL | Status: DC

## 2011-12-20 NOTE — Telephone Encounter (Signed)
Pt needs new rxs vit b 12 inj and amitiva . [facility] is [facility]

## 2011-12-25 ENCOUNTER — Other Ambulatory Visit: Payer: Self-pay | Admitting: Internal Medicine

## 2011-12-26 ENCOUNTER — Ambulatory Visit: Payer: 59 | Admitting: Internal Medicine

## 2012-01-16 ENCOUNTER — Encounter: Payer: Self-pay | Admitting: Internal Medicine

## 2012-01-16 ENCOUNTER — Ambulatory Visit (INDEPENDENT_AMBULATORY_CARE_PROVIDER_SITE_OTHER): Payer: 59 | Admitting: Internal Medicine

## 2012-01-16 VITALS — BP 124/79 | HR 72 | Temp 98.6°F | Resp 16 | Ht 66.5 in | Wt 144.0 lb

## 2012-01-16 DIAGNOSIS — N390 Urinary tract infection, site not specified: Secondary | ICD-10-CM

## 2012-01-16 DIAGNOSIS — R5383 Other fatigue: Secondary | ICD-10-CM

## 2012-01-16 DIAGNOSIS — T887XXA Unspecified adverse effect of drug or medicament, initial encounter: Secondary | ICD-10-CM

## 2012-01-16 DIAGNOSIS — F988 Other specified behavioral and emotional disorders with onset usually occurring in childhood and adolescence: Secondary | ICD-10-CM

## 2012-01-16 DIAGNOSIS — R5381 Other malaise: Secondary | ICD-10-CM

## 2012-01-16 DIAGNOSIS — R609 Edema, unspecified: Secondary | ICD-10-CM

## 2012-01-16 LAB — CBC WITH DIFFERENTIAL/PLATELET
Basophils Relative: 0.4 % (ref 0.0–3.0)
Eosinophils Relative: 1 % (ref 0.0–5.0)
HCT: 36.1 % (ref 36.0–46.0)
Lymphs Abs: 1.8 10*3/uL (ref 0.7–4.0)
MCV: 94.8 fl (ref 78.0–100.0)
Monocytes Absolute: 0.6 10*3/uL (ref 0.1–1.0)
Monocytes Relative: 7.4 % (ref 3.0–12.0)
Neutrophils Relative %: 70.9 % (ref 43.0–77.0)
RBC: 3.81 Mil/uL — ABNORMAL LOW (ref 3.87–5.11)
WBC: 8.7 10*3/uL (ref 4.5–10.5)

## 2012-01-16 LAB — BASIC METABOLIC PANEL
BUN: 8 mg/dL (ref 6–23)
Creatinine, Ser: 0.7 mg/dL (ref 0.4–1.2)
GFR: 99.2 mL/min (ref >60.00)
Glucose, Bld: 101 mg/dL — ABNORMAL HIGH (ref 70–99)

## 2012-01-16 LAB — POCT URINALYSIS DIPSTICK
Bilirubin, UA: NEGATIVE
Glucose, UA: NEGATIVE
Nitrite, UA: POSITIVE

## 2012-01-16 LAB — HEPATIC FUNCTION PANEL
Albumin: 3.8 g/dL (ref 3.5–5.2)
Total Bilirubin: 0.2 mg/dL — ABNORMAL LOW (ref 0.3–1.2)

## 2012-01-16 MED ORDER — AMPHETAMINE-DEXTROAMPHET ER 30 MG PO CP24: 30.0000 mg | ORAL_CAPSULE | ORAL | Status: DC

## 2012-01-16 MED ORDER — MORPHINE SULFATE ER BEADS 90 MG PO CP24: 90.0000 mg | ORAL_CAPSULE | Freq: Every day | ORAL | Status: AC

## 2012-01-16 MED ORDER — LEVOFLOXACIN 250 MG PO TABS: 250.0000 mg | ORAL_TABLET | Freq: Every day | ORAL | Status: AC

## 2012-01-16 MED ORDER — MORPHINE SULFATE ER BEADS 90 MG PO CP24: 90.0000 mg | ORAL_CAPSULE | Freq: Every day | ORAL | Status: DC

## 2012-01-16 NOTE — Progress Notes (Signed)
Subjective:    Patient ID: Rachel Duran, female    DOB: September 10, 1955, 56 y.o.   MRN: [id number]  HPI Increased urinary tract symptoms with dysuria burning and pain in the setting of a history of interstitial cystitis. Increased swelling in her ankles over the last 3-4 months. The swelling is "constant"  The pt has been on methotrexate for "synovitis" Has noted increased hand swelling as well. Increased ankle pain at night     Review of Systems  Constitutional: Positive for fatigue. Negative for activity change and appetite change.  HENT: Negative for ear pain, congestion, neck pain, postnasal drip and sinus pressure.   Eyes: Negative for redness and visual disturbance.  Respiratory: Negative for cough, shortness of breath and wheezing.   Gastrointestinal: Negative for abdominal pain and abdominal distention.  Genitourinary: Positive for dysuria, hematuria and pelvic pain. Negative for frequency and menstrual problem.  Musculoskeletal: Positive for myalgias and joint swelling. Negative for arthralgias.  Skin: Negative for rash and wound.  Neurological: Negative for dizziness, weakness and headaches.  Hematological: Negative for adenopathy. Does not bruise/bleed easily.  Psychiatric/Behavioral: Negative for disturbed wake/sleep cycle and decreased concentration.   Past Medical History  Diagnosis Date  . Disorders of porphyrin metabolism   . Irritable bowel syndrome   . Fibromyalgia   . Arthritis   . GERD (gastroesophageal reflux disease)   . Depression   . Allergy   . Chronic pain     History   Social History  . Marital Status: Married    Spouse Name: N/A    Number of Children: N/A  . Years of Education: N/A   Occupational History  . retired    Social History Main Topics  . Smoking status: Never Smoker   . Smokeless tobacco: Never Used  . Alcohol Use: No  . Drug Use: No  . Sexually Active: Not on file   Other Topics Concern  . Not on file   Social History  Narrative  . No narrative on file    Past Surgical History  Procedure Date  . Cervical fusion   . Cervical lamination   . Cystoscopy   . Hernia repair   . Nasal sinus surgery   . Gyn surgery   . Hysteroscopy with ablation     Family History  Problem Relation Age of Onset  . Heart disease Father   . Esophageal cancer Father     Allergies  Allergen Reactions  . Cymbalta (Duloxetine Hcl)     Urinary retention  . Codeine Phosphate     REACTION: unspecified  . Meperidine Hcl     REACTION: red skin  . Sulfamethoxazole     REACTION: unspecified    Current Outpatient Prescriptions on File Prior to Visit  Medication Sig Dispense Refill  . acyclovir (ZOVIRAX) 800 MG tablet TAKE ONE-HALF (1/2) TABLET DAILY  90 tablet  2  . ALPRAZolam (XANAX) 1 MG tablet Take 1 mg by mouth 3 (three) times daily as needed.        Marland Kitchen amLODipine (NORVASC) 5 MG tablet Take 5 mg by mouth daily.        Marland Kitchen amphetamine-dextroamphetamine (ADDERALL XR) 30 MG 24 hr capsule Take 1 capsule (30 mg total) by mouth every morning.  30 capsule  0  . ARIPiprazole (ABILIFY) 10 MG tablet Take 10 mg by mouth daily.        Marland Kitchen aspirin 81 MG tablet Take 81 mg by mouth daily.        Marland Kitchen  baclofen (LIORESAL) 10 MG tablet TAKE 1 TABLET THREE TIMES A DAY  270 tablet  2  . Calcium Carbonate-Vitamin D (CALCIUM-VITAMIN D) 500-200 MG-UNIT per tablet Take 1 tablet by mouth 3 (three) times daily with meals.        . Cholecalciferol (D 400 PO) Take by mouth.        . desipramine (NOPRAMIN) 100 MG tablet Take by mouth. 1-4 qhs prn        . diazepam (DIASTAT) 2.5 MG GEL Place vaginally as needed.        . Eszopiclone (ESZOPICLONE) 3 MG TABS Take 1 tablet (3 mg total) by mouth at bedtime. Take immediately before bedtime  30 tablet  3  . etodolac (LODINE XL) 400 MG 24 hr tablet Take 400 mg by mouth daily.      . folic acid (FOLVITE) 1 MG tablet Take 2 mg by mouth daily.        . hydroxychloroquine (PLAQUENIL) 200 MG tablet Take by mouth 2  (two) times daily.        Marland Kitchen lamoTRIgine (LAMICTAL) 150 MG tablet Take 150 mg by mouth 2 (two) times daily.        Marland Kitchen lidocaine (LIDODERM) 5 % Place 3 patches onto the skin daily. Remove & Discard patch within 12 hours or as directed by MD       . lubiprostone (AMITIZA) 24 MCG capsule Take 1 capsule (24 mcg total) by mouth daily with breakfast.  60 capsule  0  . methotrexate (RHEUMATREX) 2.5 MG tablet Take by mouth. 8 tabs per week       . ondansetron (ZOFRAN) 4 MG tablet Take 1 tablet (4 mg total) by mouth every 8 (eight) hours as needed.  30 tablet  6  . pantoprazole (PROTONIX) 40 MG tablet Take 1 tablet (40 mg total) by mouth daily.  90 tablet  3  . pentosan polysulfate (ELMIRON) 100 MG capsule Take 100 mg by mouth. 2 by mouth twice a day        . phenazopyridine-butabarbital-hyoscyamine (PHENAZOPYRIDINE PLUS) 150-15-0.3 MG tablet Take 1 tablet by mouth 3 (three) times daily as needed.  60 tablet  3  . pregabalin (LYRICA) 150 MG capsule Take 150 mg by mouth 3 (three) times daily.        . Triamcinolone Acetonide (NASACORT AQ NA) 2 sprays by Nasal route daily.        Marland Kitchen DISCONTD: [facility] SLOW RELEASE IRON 143 (45 FE) MG TBCR TAKE 1 TABLET EVERY DAY  30 tablet  0    BP 124/79  Pulse 72  Temp 98.6 F (37 C)  Resp 16  Ht 5' 6.5" (1.689 m)  Wt 144 lb (65.318 kg)  BMI 22.89 kg/m2       Objective:   Physical Exam  Constitutional: She is oriented to person, place, and time. She appears well-developed and well-nourished. No distress.  HENT:  Head: Normocephalic and atraumatic.  Right Ear: External ear normal.  Left Ear: External ear normal.  Nose: Nose normal.  Mouth/Throat: Oropharynx is clear and moist.  Eyes: Conjunctivae and EOM are normal. Pupils are equal, round, and reactive to light.  Neck: Normal range of motion. Neck supple. No JVD present. No tracheal deviation present. No thyromegaly present.  Cardiovascular: Normal rate, regular rhythm, normal heart sounds and intact distal  pulses.   No murmur heard. Pulmonary/Chest: Effort normal and breath sounds normal. She has no wheezes. She exhibits no tenderness.  Abdominal: Soft. Bowel sounds are  normal. She exhibits distension. There is tenderness.  Musculoskeletal: Normal range of motion. She exhibits edema and tenderness.  Lymphadenopathy:    She has no cervical adenopathy.  Neurological: She is alert and oriented to person, place, and time. She has normal reflexes. No cranial nerve deficit.  Skin: Skin is warm and dry. She is not diaphoretic.  Psychiatric: She has a normal mood and affect. Her behavior is normal.          Assessment & Plan:  The patient may have increased synovitis in her joints she has noticed swelling in her hands and feet she states that this was similar to this event she had before she began methotrexate.  She is on a fairly large dose of methotrexate at this time somewhat uncomfortable with titrating it but she may be a candidate for Enbrel.  I will defer that to her rheumatologist but for diagnostic test we will give her a dose of Depo-Medrol today if her symptoms immediately improved I would suggest that this is more likely I will be in sinusitis.  She has a urinary tract infection in the setting of interstitial cystitis we will treat her with 7 days of Levaquin.

## 2012-01-18 LAB — URINE CULTURE

## 2012-01-24 ENCOUNTER — Telehealth: Payer: Self-pay | Admitting: Internal Medicine

## 2012-01-24 NOTE — Telephone Encounter (Signed)
To dr Corliss Skains to discuss methotrexate and labs were basically wnl

## 2012-01-24 NOTE — Telephone Encounter (Signed)
Caller: Joycelyn/Mother; Phone Number: [phone]; Message from caller: Pt calling back per instructions with update on s/s.  States swelling is 85% better after injection she received in office.  Pt now needs to know f/u plan, does she need to go back to see Dr.  Corliss Skains at this point.  She also wants to get her lab results.

## 2012-02-03 ENCOUNTER — Telehealth: Payer: Self-pay | Admitting: Internal Medicine

## 2012-02-03 NOTE — Telephone Encounter (Signed)
Caller: Rachel Duran/Patient; PCP: Darryll Capers; CB#: [phone]; ; ; Call regarding Swelling of Hands; Was seen in office 6-24 due to hand swelling. Was given injection in hand and told if helped to follow up with rheumatologist. 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 Lovell Sheehan to treat this condition. Was told if has positive TB test, internist will treat with oral medicine. Has to stop Methotrexate and start Cimzia for arthritis but cannot do that until treated for positive TB test. Was told MD would know what medicine needs to be prescribed. States would not mind coming to office for appointment but wants to know what MD recommends. Refuses to schedule until MD has input. Last TB test 02-2009 was negative.  PLEASE CALL PT AND ADVISE IF MD WILL PRESCRIBE NEEDED MEDICINE.

## 2012-02-06 ENCOUNTER — Telehealth: Payer: Self-pay

## 2012-02-06 ENCOUNTER — Other Ambulatory Visit: Payer: Self-pay | Admitting: Internal Medicine

## 2012-02-06 DIAGNOSIS — R7611 Nonspecific reaction to tuberculin skin test without active tuberculosis: Secondary | ICD-10-CM

## 2012-02-06 NOTE — Telephone Encounter (Signed)
It will need to be with MW. He has openings and no other provider does. LMTCB for [employer].

## 2012-02-06 NOTE — Telephone Encounter (Signed)
Set appt w/ MW for 02/07/16 @ 4:30 pm.  Nothing further needed at this time.  Antionette Fairy

## 2012-02-07 ENCOUNTER — Ambulatory Visit (INDEPENDENT_AMBULATORY_CARE_PROVIDER_SITE_OTHER): Payer: 59 | Admitting: Internal Medicine

## 2012-02-07 ENCOUNTER — Ambulatory Visit (INDEPENDENT_AMBULATORY_CARE_PROVIDER_SITE_OTHER)
Admission: RE | Admit: 2012-02-07 | Discharge: 2012-02-07 | Disposition: A | Discharge status: Home / Self Care | Payer: 59 | Source: Ambulatory Visit | Attending: Internal Medicine | Admitting: Internal Medicine

## 2012-02-07 ENCOUNTER — Encounter: Payer: Self-pay | Admitting: Internal Medicine

## 2012-02-07 VITALS — BP 118/64 | HR 80 | Temp 97.4°F | Ht 67.0 in | Wt 145.6 lb

## 2012-02-07 DIAGNOSIS — R7612 Nonspecific reaction to cell mediated immunity measurement of gamma interferon antigen response without active tuberculosis: Secondary | ICD-10-CM | POA: Insufficient documentation

## 2012-02-07 DIAGNOSIS — R7611 Nonspecific reaction to tuberculin skin test without active tuberculosis: Secondary | ICD-10-CM

## 2012-02-07 HISTORY — DX: Nonspecific reaction to cell mediated immunity measurement of gamma interferon antigen response without active tuberculosis: R76.12

## 2012-02-07 NOTE — Patient Instructions (Addendum)
Please remember to go to  the x-ray department downstairs for your tests - we will call you with the results when they are available.   Once I  have a chance to review your records and the literature re: treatment options I'll call you with a recommendation for treatment and follow up  Late add:  [facility]/ Dr Burnice Logan

## 2012-02-07 NOTE — Progress Notes (Signed)
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    02/07/2012 1st pulmonary eval cc mostly limited by arthritis hips, ankles knees and hands. No sob or cough, no known TB exposure or unusual travel hx.  Some sweats chronically, nothing saturating.   Sleeping ok without nocturnal  or early am exacerbation  of respiratory  c/o's or need for noct saba. Also denies any obvious fluctuation of symptoms with weather or environmental changes or other aggravating or alleviating factors except as outlined above   ROS  The following are not active complaints unless bolded sore throat, dysphagia, dental problems, itching, sneezing,  nasal congestion or excess/ purulent secretions, ear ache,   fever, chills, sweats, unintended wt loss, pleuritic or exertional cp, hemoptysis,  orthopnea pnd or leg swelling, presyncope, palpitations, heartburn, abdominal pain, anorexia, nausea, vomiting, diarrhea  or change in bowel or urinary habits, change in stools or urine, dysuria,hematuria,  rash, arthralgias, visual complaints, headache, numbness weakness or ataxia or problems with walking or coordination,  change in mood/affect or memory.       Review of Systems     Objective:   Physical Exam amb thin wf nad  Wt 145 02/07/2012  HEENT: nl dentition, turbinates, and orophanx. Nl external ear canals without cough reflex   NECK :  without JVD/Nodes/TM/ nl carotid upstrokes bilaterally   LUNGS: no acc muscle use, clear to A and P bilaterally without cough on insp or exp maneuvers   CV:  RRR  no s3 or murmur or increase in P2, no edema   ABD:  soft and nontender with nl excursion in the supine position. No bruits or organomegaly, bowel sounds nl  MS:  warm without deformities, calf tenderness, cyanosis  or clubbing  SKIN: warm and dry without lesions    NEURO:  alert, approp, no deficits    CXR  02/07/2012 : No acute cardiopulmonary disease.      Assessment:         Plan:

## 2012-02-07 NOTE — Progress Notes (Signed)
  Subjective:    Patient ID: Rachel Duran, female    DOB: 11/15/1955, 56 y.o.   MRN: [id number]  HPI    Review of Systems  Constitutional: Negative.   HENT: Negative.   Eyes: Negative.   Respiratory: Negative.   Cardiovascular:       Feet/Hand swelling  Gastrointestinal:       Acid heartburn/indegestion  Genitourinary: Negative.   Musculoskeletal: Negative.   Skin: Negative.   Neurological: Negative.   Hematological: Negative.   Psychiatric/Behavioral: The patient is nervous/anxious.        Objective:   Physical Exam        Assessment & Plan:

## 2012-02-08 NOTE — Progress Notes (Signed)
Quick Note:  Called patients home number, no answer unable to leave message. Also called patients cell number provided [id number] ______

## 2012-02-08 NOTE — Progress Notes (Signed)
Quick Note:  Pt returned call. I informed her of cxr results and recs per Dr. Sherene Sires. She verbalized understanding of this. ______

## 2012-02-10 ENCOUNTER — Encounter: Payer: Self-pay | Admitting: Internal Medicine

## 2012-02-10 ENCOUNTER — Telehealth: Payer: Self-pay | Admitting: Internal Medicine

## 2012-02-10 NOTE — Telephone Encounter (Signed)
Pt called to let us know that she got phone message from [facility] this morning about [facility] and has appt with them on Wednesday 02-15-12 at 11am. Also, pt requested we send OV notes and CXR report to Dr Corliss Skains. Will forward to MW and sign message as FYI.

## 2012-02-10 NOTE — Telephone Encounter (Signed)
Let her know we already did this and also sent to Dr Roxan Hockey

## 2012-02-10 NOTE — Assessment & Plan Note (Addendum)
She does not have any evidence of active TB or a pulmonary problem at this point but is at high risk of reactivation TB   Discussed in detail all the  indications, usual  risks and alternatives  relative to the benefits with patient who agrees to proceed with empiric treatment = probably inh prophylaxis for 9 months since she will be starting immunosuppressive rx by her rheumatologist   The problem is that she's relatively high risk for drug interactions and p discussion with Dr Burnice Logan at the [facility] I strongly recommend her treatment be supervised by Dr Roxan Hockey who also agrees she needs treatment.

## 2012-02-14 ENCOUNTER — Other Ambulatory Visit: Payer: Self-pay | Admitting: Internal Medicine

## 2012-02-15 ENCOUNTER — Ambulatory Visit: Payer: 59 | Admitting: Internal Medicine

## 2012-03-07 ENCOUNTER — Telehealth: Payer: Self-pay | Admitting: Internal Medicine

## 2012-03-07 NOTE — Telephone Encounter (Signed)
I spoke with Dr. Roxan Hockey and he is requesting to speak to Dr. Sherene Sires about this pt. He is aware he is off until tomorrow. He stated that was fine and just to have MW return his call once he returns to the office. Please advise Dr. Sherene Sires, thanks

## 2012-03-08 NOTE — Telephone Encounter (Signed)
Discussed inh with Dr Carolyn Stare > he is concerned about polypharmacy and immunosuppression but deferred him to Dr Titus Dubin for these issues

## 2012-03-13 ENCOUNTER — Other Ambulatory Visit: Payer: Self-pay | Admitting: *Deleted

## 2012-03-13 MED ORDER — ESZOPICLONE 3 MG PO TABS: 3.0000 mg | ORAL_TABLET | Freq: Every day | ORAL | Status: DC

## 2012-04-04 ENCOUNTER — Ambulatory Visit (INDEPENDENT_AMBULATORY_CARE_PROVIDER_SITE_OTHER): Payer: 59 | Admitting: Internal Medicine

## 2012-04-04 ENCOUNTER — Encounter: Payer: Self-pay | Admitting: Internal Medicine

## 2012-04-04 VITALS — BP 110/70 | HR 104 | Temp 98.2°F | Resp 16 | Ht 67.0 in | Wt 146.0 lb

## 2012-04-04 DIAGNOSIS — IMO0001 Reserved for inherently not codable concepts without codable children: Secondary | ICD-10-CM

## 2012-04-04 DIAGNOSIS — D638 Anemia in other chronic diseases classified elsewhere: Secondary | ICD-10-CM

## 2012-04-04 DIAGNOSIS — M5412 Radiculopathy, cervical region: Secondary | ICD-10-CM

## 2012-04-04 DIAGNOSIS — Z23 Encounter for immunization: Secondary | ICD-10-CM

## 2012-04-04 DIAGNOSIS — N3010 Interstitial cystitis (chronic) without hematuria: Secondary | ICD-10-CM

## 2012-04-04 DIAGNOSIS — F988 Other specified behavioral and emotional disorders with onset usually occurring in childhood and adolescence: Secondary | ICD-10-CM

## 2012-04-04 MED ORDER — MORPHINE SULFATE ER BEADS 90 MG PO CP24: 90.0000 mg | ORAL_CAPSULE | Freq: Every day | ORAL | Status: DC

## 2012-04-04 MED ORDER — AMPHETAMINE-DEXTROAMPHET ER 30 MG PO CP24: 30.0000 mg | ORAL_CAPSULE | ORAL | Status: DC

## 2012-04-04 NOTE — Patient Instructions (Addendum)
The patient is instructed to continue all medications as prescribed. Schedule followup with check out clerk upon leaving the clinic  

## 2012-04-04 NOTE — Progress Notes (Signed)
Subjective:    Patient ID: Rachel Duran, female    DOB: 02/16/1956, 56 y.o.   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    Review of Systems  Constitutional: Negative for activity change, appetite change and fatigue.  HENT: Negative for ear pain, congestion, neck pain, postnasal drip and sinus pressure.   Eyes: Negative for redness and visual disturbance.  Respiratory: Negative for cough, shortness of breath and wheezing.   Gastrointestinal: Negative for abdominal pain and abdominal distention.  Genitourinary: Negative for dysuria, frequency and menstrual problem.  Musculoskeletal: Negative for myalgias, joint swelling and arthralgias.  Skin: Negative for rash and wound.  Neurological: Negative for dizziness, weakness and headaches.  Hematological: Negative for adenopathy. Does not bruise/bleed easily.  Psychiatric/Behavioral: Negative for disturbed wake/sleep cycle and decreased concentration.   Past Medical History  Diagnosis Date  . Disorders of porphyrin metabolism   . Irritable bowel syndrome   . Fibromyalgia   . Arthritis   . GERD (gastroesophageal reflux disease)   . Depression   . Allergy   . Chronic pain     History   Social History  . Marital Status: Married    Spouse Name: N/A    Number of Children: N/A  . Years of Education: N/A   Occupational History  . retired    Social History Main Topics  . Smoking status: Never Smoker   . Smokeless tobacco: Never Used  . Alcohol Use: No  . Drug Use: No  . Sexually Active: Not on file   Other Topics Concern  . Not on file   Social History Narrative  . No narrative on file    Past  Surgical History  Procedure Date  . Cervical fusion   . Cervical lamination   . Cystoscopy   . Hernia repair   . Nasal sinus surgery   . Gyn surgery   . Hysteroscopy with ablation   . Breast enhancement surgery 2010  . Nasal sinus surgery     x5    Family History  Problem Relation Age of Onset  . Heart disease Father   . Esophageal cancer Father   . Scleroderma Mother     Allergies  Allergen Reactions  . Cymbalta (Duloxetine Hcl)     Urinary retention  . Codeine Phosphate     REACTION: unspecified  . Meperidine Hcl     REACTION: red skin  . Sulfamethoxazole     REACTION: unspecified    Current Outpatient Prescriptions on File Prior to Visit  Medication Sig Dispense Refill  . acyclovir (ZOVIRAX) 800 MG tablet TAKE ONE-HALF (1/2) TABLET DAILY  90 tablet  2  . ALPRAZolam (XANAX) 1 MG tablet Take 1 mg by mouth 3 (three) times daily as needed.        . AMITIZA 24 MCG capsule TAKE ONE CAPSULE BY MOUTH ONCE DAILY WITH BREAKFAST  30 each  6  . amLODipine (NORVASC) 5 MG tablet Take 5 mg by mouth daily.        Marland Kitchen amphetamine-dextroamphetamine (ADDERALL XR) 30 MG 24 hr capsule Take 1 capsule (30 mg total) by mouth every morning.  30 capsule  0  .  ARIPiprazole (ABILIFY) 10 MG tablet Take 10 mg by mouth daily.        Marland Kitchen aspirin 81 MG tablet Take 81 mg by mouth daily.        . baclofen (LIORESAL) 10 MG tablet TAKE 1 TABLET THREE TIMES A DAY  270 tablet  2  . Calcium Carbonate-Vitamin D (CALCIUM-VITAMIN D) 500-200 MG-UNIT per tablet Take 1 tablet by mouth 3 (three) times daily with meals.        . Cholecalciferol (D 400 PO) Take by mouth.        Marland Kitchen Co-Enzyme Q-10 60 MG CAPS Take 1 capsule by mouth 3 (three) times daily.      . cyanocobalamin (,VITAMIN B-12,) 1000 MCG/ML injection Inject 1,000 mcg into the muscle every 21 ( twenty-one) days.      . diazepam (DIASTAT) 2.5 MG GEL Place vaginally as needed.        . Eszopiclone (ESZOPICLONE) 3 MG TABS Take 1 tablet (3 mg total) by mouth at  bedtime. Take immediately before bedtime  30 tablet  3  . etodolac (LODINE XL) 400 MG 24 hr tablet Take 400 mg by mouth daily.      . folic acid (FOLVITE) 1 MG tablet Take 2 mg by mouth daily.        . hydroxychloroquine (PLAQUENIL) 200 MG tablet Take by mouth 2 (two) times daily.        Marland Kitchen lamoTRIgine (LAMICTAL) 150 MG tablet Take 150 mg by mouth 2 (two) times daily.        Marland Kitchen lidocaine (LIDODERM) 5 % Place 3 patches onto the skin daily. Remove & Discard patch within 12 hours or as directed by MD       . Manganese 50 MG TABS Take 1 tablet by mouth daily.      Marland Kitchen morphine (MSIR) 15 MG tablet Take 1 tablet by mouth as needed.      . ondansetron (ZOFRAN) 4 MG tablet Take 1 tablet (4 mg total) by mouth every 8 (eight) hours as needed.  30 tablet  6  . OVER THE COUNTER MEDICATION Take 1,000 mg by mouth 3 (three) times daily.      . pantoprazole (PROTONIX) 40 MG tablet Take 1 tablet (40 mg total) by mouth daily.  90 tablet  3  . pentosan polysulfate (ELMIRON) 100 MG capsule Take 100 mg by mouth. 2 by mouth twice a day        . phenazopyridine-butabarbital-hyoscyamine (PHENAZOPYRIDINE PLUS) 150-15-0.3 MG tablet Take 1 tablet by mouth 3 (three) times daily as needed.  60 tablet  3  . pregabalin (LYRICA) 150 MG capsule Take 150 mg by mouth 3 (three) times daily.        Marland Kitchen thiamine (VITAMIN B-1) 100 MG tablet Take 100 mg by mouth daily.      . Triamcinolone Acetonide (NASACORT AQ NA) 2 sprays by Nasal route daily.        Marland Kitchen VAGIFEM 10 MCG TABS Place 1 tablet vaginally 2 (two) times a week.      . methotrexate (RHEUMATREX) 2.5 MG tablet Take by mouth. 8 tabs per week       . DISCONTD: [facility] SLOW RELEASE IRON 143 (45 FE) MG TBCR TAKE 1 TABLET EVERY DAY  30 tablet  0    BP 110/70  Pulse 104  Temp 98.2 F (36.8 C)  Resp 16  Ht 5\' 7"  (1.702 m)  Wt 146 lb (66.225 kg)  BMI 22.87 kg/m2  Objective:   Physical Exam  Nursing note and vitals reviewed. Constitutional: She is oriented to person, place,  and time. She appears well-developed and well-nourished. No distress.  HENT:  Head: Normocephalic and atraumatic.  Right Ear: External ear normal.  Left Ear: External ear normal.  Nose: Nose normal.  Mouth/Throat: Oropharynx is clear and moist.  Eyes: Conjunctivae normal and EOM are normal. Pupils are equal, round, and reactive to light.  Neck: Normal range of motion. Neck supple. No JVD present. No tracheal deviation present. No thyromegaly present.  Cardiovascular: Normal rate, regular rhythm, normal heart sounds and intact distal pulses.   No murmur heard. Pulmonary/Chest: Effort normal and breath sounds normal. She has no wheezes. She exhibits no tenderness.  Abdominal: Soft. Bowel sounds are normal.  Musculoskeletal: Normal range of motion. She exhibits no edema and no tenderness.  Lymphadenopathy:    She has no cervical adenopathy.  Neurological: She is alert and oriented to person, place, and time. She has normal reflexes. No cranial nerve deficit.  Skin: Skin is warm and dry. She is not diaphoretic.  Psychiatric: She has a normal mood and affect. Her behavior is normal.          Assessment & Plan:  Will review the flu vaccination and her INH therapy as well as high does prednisone Fibromyalgia flair with ... ID recommends the flu shot now Stable with out pulmonary symptoms.  WIll discuss the use of Embrel in the setting of completing two months of INH with her rheumatologist monitoring blood through the health department

## 2012-04-30 ENCOUNTER — Telehealth: Payer: Self-pay | Admitting: Internal Medicine

## 2012-04-30 DIAGNOSIS — R05 Cough: Secondary | ICD-10-CM

## 2012-04-30 DIAGNOSIS — R059 Cough, unspecified: Secondary | ICD-10-CM

## 2012-04-30 MED ORDER — BENZONATATE 200 MG PO CAPS: 200.0000 mg | ORAL_CAPSULE | Freq: Three times a day (TID) | ORAL | Status: DC | PRN

## 2012-04-30 NOTE — Telephone Encounter (Signed)
Per dr Lovell Sheehan- chest xray tomorrow and then may h ave tessalon pearles for cough

## 2012-04-30 NOTE — Telephone Encounter (Signed)
Pt has cough decline to see another provider due to history tb

## 2012-05-01 ENCOUNTER — Ambulatory Visit (INDEPENDENT_AMBULATORY_CARE_PROVIDER_SITE_OTHER)
Admission: RE | Admit: 2012-05-01 | Discharge: 2012-05-01 | Disposition: A | Discharge status: Home / Self Care | Payer: 59 | Source: Ambulatory Visit | Attending: Internal Medicine | Admitting: Internal Medicine

## 2012-05-01 DIAGNOSIS — R059 Cough, unspecified: Secondary | ICD-10-CM

## 2012-05-01 DIAGNOSIS — R05 Cough: Secondary | ICD-10-CM

## 2012-05-02 ENCOUNTER — Telehealth: Payer: Self-pay | Admitting: Internal Medicine

## 2012-05-02 NOTE — Telephone Encounter (Signed)
Patient is calling concerning some labs that were sent over from Dr Roxan Hockey at the [facility], she has questions about her medications and INH therapy. jms/can

## 2012-05-03 ENCOUNTER — Telehealth: Payer: Self-pay | Admitting: Internal Medicine

## 2012-05-03 NOTE — Telephone Encounter (Signed)
Caller: Rachel Duran/Patient; Phone: [phone]; Reason for Call: Patient calling to follow up on a message she left yesterday for Dr.  Lovell Sheehan regarding labs showed liver function elevated.  States Dr.  Roxan Hockey stop the INH therapy, Dr.  Roxan Hockey suggested she call Dr.  Lovell Sheehan to see if there were any medications she needed to stop taking.  Please call patient back.  Thanks

## 2012-05-03 NOTE — Telephone Encounter (Signed)
duplicate

## 2012-05-04 ENCOUNTER — Other Ambulatory Visit: Payer: Self-pay | Admitting: *Deleted

## 2012-05-04 NOTE — Telephone Encounter (Signed)
Pt advised and is getting weekly lfts at [facility] until lft are normal

## 2012-05-04 NOTE — Telephone Encounter (Signed)
hold the plaquinil and stay our of sun No wine Check lfts in one week after stoping

## 2012-05-04 NOTE — Telephone Encounter (Signed)
Please review meds and tell us if any she should stop due to elevated lfts

## 2012-05-04 NOTE — Telephone Encounter (Signed)
Pt informed

## 2012-05-09 ENCOUNTER — Telehealth: Payer: Self-pay | Admitting: Internal Medicine

## 2012-05-09 NOTE — Telephone Encounter (Signed)
Patient calling to make sure that the labs that were faxed over on her LFT's were attached to her chart?  States that she called earlier and hasn't heard from the MD yet. Also still has the cough/bronchitis like sx that she had last week and is using the medication suggested by MD.  No fever or sputum.

## 2012-05-09 NOTE — Telephone Encounter (Signed)
Pt  Made aware dr Lovell Sheehan back tomorrow and will show labs when he returns

## 2012-05-10 ENCOUNTER — Other Ambulatory Visit: Payer: Self-pay | Admitting: *Deleted

## 2012-05-10 DIAGNOSIS — R7989 Other specified abnormal findings of blood chemistry: Secondary | ICD-10-CM

## 2012-05-10 MED ORDER — DOXYCYCLINE HYCLATE 100 MG PO TABS: 100.0000 mg | ORAL_TABLET | Freq: Two times a day (BID) | ORAL | Status: DC

## 2012-05-11 ENCOUNTER — Encounter: Payer: Self-pay | Admitting: Internal Medicine

## 2012-05-11 ENCOUNTER — Ambulatory Visit (INDEPENDENT_AMBULATORY_CARE_PROVIDER_SITE_OTHER): Payer: 59 | Admitting: Internal Medicine

## 2012-05-11 ENCOUNTER — Other Ambulatory Visit: Payer: 59

## 2012-05-11 VITALS — BP 112/86 | HR 80 | Temp 98.2°F | Resp 16 | Wt 143.0 lb

## 2012-05-11 DIAGNOSIS — K716 Toxic liver disease with hepatitis, not elsewhere classified: Secondary | ICD-10-CM

## 2012-05-11 DIAGNOSIS — K759 Inflammatory liver disease, unspecified: Secondary | ICD-10-CM

## 2012-05-11 DIAGNOSIS — T50904A Poisoning by unspecified drugs, medicaments and biological substances, undetermined, initial encounter: Secondary | ICD-10-CM

## 2012-05-11 NOTE — Progress Notes (Signed)
Subjective:    Patient ID: Rachel Duran, female    DOB: Mar 27, 1956, 56 y.o.   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 Prior to initiating new biological immunotherapy a quantiferon TB gold assay was performed and was positive. 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  Earlier in the week she felt quite poorly with extreme fatigue and anorexia. She felt that her eyes were slightly yellow and urine dark. Over the past 2 days she has improved dramatically and she feels her urine has normalized. Liver function studies on October 8 revealed transaminases in the 1000 range and a normal total bilirubin of 1.1 followup LFTs on October 15 revealed a total bilirubin of 2.9. AST down to 504 and ALT was essentially unchanged at 1200. Albumin has remained normal.  She is scheduled for followup and lab at public health in 4 days.   laboratory studies reviewed  Past Medical History  Diagnosis Date  . Disorders of porphyrin metabolism   . Irritable bowel syndrome   . Fibromyalgia   . Arthritis   . GERD (gastroesophageal reflux disease)   . Depression   . Allergy   . Chronic pain     History   Social History  . Marital Status: Married    Spouse Name: N/A    Number of Children: N/A  . Years of Education: N/A   Occupational History  . retired    Social History Main Topics  . Smoking status: Never Smoker   . Smokeless tobacco: Never Used  . Alcohol Use: No  . Drug Use: No  . Sexually Active: Not on file   Other Topics Concern  . Not on file   Social History Narrative  . No narrative on file    Past Surgical History  Procedure Date  . Cervical fusion   . Cervical lamination   . Cystoscopy   . Hernia repair   . Nasal sinus surgery   . Gyn surgery   . Hysteroscopy with ablation   . Breast enhancement surgery 2010  . Nasal sinus surgery     x5    Family History  Problem Relation Age of Onset    . Heart disease Father   . Esophageal cancer Father   . Scleroderma Mother     Allergies  Allergen Reactions  . Cymbalta (Duloxetine Hcl)     Urinary retention  . Codeine Phosphate     REACTION: unspecified  . Meperidine Hcl     REACTION: red skin  . Sulfamethoxazole     REACTION: unspecified    Current Outpatient Prescriptions on File Prior to Visit  Medication Sig Dispense Refill  . acyclovir (ZOVIRAX) 800 MG tablet TAKE ONE-HALF (1/2) TABLET DAILY  90 tablet  2  . ALPRAZolam (XANAX) 1 MG tablet Take 1 mg by mouth 3 (three) times daily as needed.        . AMITIZA 24 MCG capsule TAKE ONE CAPSULE BY MOUTH ONCE DAILY WITH BREAKFAST  30 each  6  . amLODipine (NORVASC) 5 MG tablet Take 5 mg by mouth daily.        Marland Kitchen amphetamine-dextroamphetamine (ADDERALL XR) 30 MG 24 hr capsule Take 1 capsule (30 mg total) by mouth every morning.  30 capsule  0  . ARIPiprazole (ABILIFY) 10 MG tablet Take 10 mg by mouth daily.        Marland Kitchen aspirin 81 MG tablet  Take 81 mg by mouth daily.        . baclofen (LIORESAL) 10 MG tablet TAKE 1 TABLET THREE TIMES A DAY  270 tablet  2  . benzonatate (TESSALON) 200 MG capsule Take 1 capsule (200 mg total) by mouth 3 (three) times daily as needed for cough.  30 capsule  1  . Calcium Carbonate-Vitamin D (CALCIUM-VITAMIN D) 500-200 MG-UNIT per tablet Take 1 tablet by mouth 3 (three) times daily with meals.        . Cholecalciferol (D 400 PO) Take by mouth.        Marland Kitchen Co-Enzyme Q-10 60 MG CAPS Take 1 capsule by mouth 3 (three) times daily.      . cyanocobalamin (,VITAMIN B-12,) 1000 MCG/ML injection Inject 1,000 mcg into the muscle every 21 ( twenty-one) days.      . diazepam (DIASTAT) 2.5 MG GEL Place vaginally as needed.        . doxycycline (VIBRA-TABS) 100 MG tablet Take 1 tablet (100 mg total) by mouth 2 (two) times daily.  20 tablet  0  . Eszopiclone (ESZOPICLONE) 3 MG TABS Take 1 tablet (3 mg total) by mouth at bedtime. Take immediately before bedtime  30 tablet  3   . etodolac (LODINE XL) 400 MG 24 hr tablet Take 400 mg by mouth daily.      . folic acid (FOLVITE) 1 MG tablet Take 2 mg by mouth daily.        . hydroxychloroquine (PLAQUENIL) 200 MG tablet Take by mouth 2 (two) times daily.        Marland Kitchen lamoTRIgine (LAMICTAL) 150 MG tablet Take 150 mg by mouth 2 (two) times daily.        Marland Kitchen lidocaine (LIDODERM) 5 % Place 3 patches onto the skin daily. Remove & Discard patch within 12 hours or as directed by MD       . Manganese 50 MG TABS Take 1 tablet by mouth daily.      . methotrexate (RHEUMATREX) 2.5 MG tablet Take by mouth. 8 tabs per week       . morphine (AVINZA) 90 MG 24 hr capsule Take 1 capsule (90 mg total) by mouth daily.  30 capsule  0  . morphine (AVINZA) 90 MG 24 hr capsule Take 1 capsule (90 mg total) by mouth daily.  30 capsule  0  . morphine (MSIR) 15 MG tablet Take 1 tablet by mouth as needed.      . ondansetron (ZOFRAN) 4 MG tablet Take 1 tablet (4 mg total) by mouth every 8 (eight) hours as needed.  30 tablet  6  . OVER THE COUNTER MEDICATION Take 1,000 mg by mouth 3 (three) times daily.      . pantoprazole (PROTONIX) 40 MG tablet Take 1 tablet (40 mg total) by mouth daily.  90 tablet  3  . pentosan polysulfate (ELMIRON) 100 MG capsule Take 100 mg by mouth. 2 by mouth twice a day        . phenazopyridine-butabarbital-hyoscyamine (PHENAZOPYRIDINE PLUS) 150-15-0.3 MG tablet Take 1 tablet by mouth 3 (three) times daily as needed.  60 tablet  3  . predniSONE (DELTASONE) 5 MG tablet Take 5 mg by mouth daily. titrating off for 3 weeks      . pregabalin (LYRICA) 150 MG capsule Take 150 mg by mouth 3 (three) times daily.        Marland Kitchen pyridOXINE (B-6) 50 MG tablet Take 50 mg by mouth daily.      Marland Kitchen  thiamine (VITAMIN B-1) 100 MG tablet Take 100 mg by mouth daily.      . Triamcinolone Acetonide (NASACORT AQ NA) 2 sprays by Nasal route daily.        Marland Kitchen VAGIFEM 10 MCG TABS Place 1 tablet vaginally 2 (two) times a week.      Marland Kitchen DISCONTD: [facility] SLOW RELEASE IRON  143 (45 FE) MG TBCR TAKE 1 TABLET EVERY DAY  30 tablet  0    BP 112/86  Pulse 80  Temp 98.2 F (36.8 C) (Oral)  Resp 16  Wt 143 lb (64.864 kg)  SpO2 91%         Review of Systems  Constitutional: Positive for activity change, appetite change and fatigue.  HENT: Negative for hearing loss, congestion, sore throat, rhinorrhea, dental problem, sinus pressure and tinnitus.   Eyes: Negative for pain, discharge and visual disturbance.  Respiratory: Negative for cough and shortness of breath.   Cardiovascular: Negative for chest pain, palpitations and leg swelling.  Gastrointestinal: Positive for abdominal pain. Negative for nausea, vomiting, diarrhea, constipation, blood in stool and abdominal distention.  Genitourinary: Negative for dysuria, urgency, frequency, hematuria, flank pain, vaginal bleeding, vaginal discharge, difficulty urinating, vaginal pain and pelvic pain.  Musculoskeletal: Negative for joint swelling, arthralgias and gait problem.  Skin: Negative for rash.  Neurological: Positive for weakness. Negative for dizziness, syncope, speech difficulty, numbness and headaches.  Hematological: Negative for adenopathy.  Psychiatric/Behavioral: Negative for behavioral problems, dysphoric mood and agitation. The patient is not nervous/anxious.        Objective:   Physical Exam  Constitutional: She is oriented to person, place, and time. She appears well-developed and well-nourished.  HENT:  Head: Normocephalic.  Right Ear: External ear normal.  Left Ear: External ear normal.  Mouth/Throat: Oropharynx is clear and moist.  Eyes: Conjunctivae normal and EOM are normal. Pupils are equal, round, and reactive to light.       Conjunctival injection but doubt icteric  Neck: Normal range of motion. Neck supple. No thyromegaly present.  Cardiovascular: Normal rate, regular rhythm, normal heart sounds and intact distal pulses.   Pulmonary/Chest: Effort normal and breath sounds normal.   Abdominal: Soft. Bowel sounds are normal. She exhibits no mass. There is no tenderness.       Right upper quadrant tenderness but no definite hepatomegaly  Mild punch tenderness  Musculoskeletal: Normal range of motion.  Lymphadenopathy:    She has no cervical adenopathy.  Neurological: She is alert and oriented to person, place, and time.  Skin: Skin is warm and dry. No rash noted.  Psychiatric: She has a normal mood and affect. Her behavior is normal.          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 Symptomatically patient is greatly improved and scleral icterus has resolved. She is scheduled for followup at the public health department in 4 days and repeat lab studies will be checked at that time

## 2012-05-11 NOTE — Patient Instructions (Signed)
Drink as much fluid as you  can tolerate over the next few days; advance diet as tolerated  Followup Dr. Roxan Hockey at the [facility] as scheduled in 4 days

## 2012-05-14 ENCOUNTER — Telehealth: Payer: Self-pay | Admitting: *Deleted

## 2012-05-14 NOTE — Telephone Encounter (Signed)
Done

## 2012-05-15 ENCOUNTER — Encounter: Payer: Self-pay | Admitting: *Deleted

## 2012-05-15 ENCOUNTER — Ambulatory Visit
Admission: RE | Admit: 2012-05-15 | Discharge: 2012-05-15 | Disposition: A | Discharge status: Home / Self Care | Payer: 59 | Source: Ambulatory Visit | Attending: Internal Medicine | Admitting: Internal Medicine

## 2012-05-15 DIAGNOSIS — R7989 Other specified abnormal findings of blood chemistry: Secondary | ICD-10-CM

## 2012-05-16 ENCOUNTER — Telehealth: Payer: Self-pay | Admitting: *Deleted

## 2012-05-16 ENCOUNTER — Ambulatory Visit: Payer: 59 | Admitting: Physician Assistant

## 2012-05-16 ENCOUNTER — Other Ambulatory Visit: Payer: Self-pay | Admitting: Gastroenterology

## 2012-05-16 ENCOUNTER — Encounter: Payer: Self-pay | Admitting: Physician Assistant

## 2012-05-16 ENCOUNTER — Ambulatory Visit (INDEPENDENT_AMBULATORY_CARE_PROVIDER_SITE_OTHER): Payer: 59 | Admitting: Physician Assistant

## 2012-05-16 ENCOUNTER — Other Ambulatory Visit (INDEPENDENT_AMBULATORY_CARE_PROVIDER_SITE_OTHER): Payer: 59

## 2012-05-16 VITALS — BP 102/56 | HR 88 | Ht 65.0 in | Wt 139.2 lb

## 2012-05-16 DIAGNOSIS — R7989 Other specified abnormal findings of blood chemistry: Secondary | ICD-10-CM

## 2012-05-16 DIAGNOSIS — K838 Other specified diseases of biliary tract: Secondary | ICD-10-CM

## 2012-05-16 LAB — CBC WITH DIFFERENTIAL/PLATELET
Basophils Absolute: 0 10*3/uL (ref 0.0–0.1)
HCT: 27.6 % — ABNORMAL LOW (ref 36.0–46.0)
Lymphs Abs: 2.2 10*3/uL (ref 0.7–4.0)
MCV: 88.4 fl (ref 78.0–100.0)
Monocytes Absolute: 0.5 10*3/uL (ref 0.1–1.0)
Neutrophils Relative %: 50.3 % (ref 43.0–77.0)
Platelets: 676 10*3/uL — ABNORMAL HIGH (ref 150.0–400.0)
RDW: 13.6 % (ref 11.5–14.6)

## 2012-05-16 LAB — PROTIME-INR
INR: 1.1 ratio — ABNORMAL HIGH (ref 0.8–1.0)
Prothrombin Time: 11.9 s (ref 10.2–12.4)

## 2012-05-16 NOTE — Patient Instructions (Addendum)
Your physician has requested that you go to the basement for lab work before leaving today.  You have been scheduled for an ERCP with Dr Russella Dar on Tuesday May 22, 2012 at [facility] at 9:30 am. Please follow the instructions given to you today.   We will call you back with a surgical referral to [facility]   _______________________________________________________________________________________________________  You have been scheduled for a CT scan of the abdomen and pelvis at [facility] ([address]---this is in the same building as Architectural technologist).   You are scheduled on 05-18-2011 at 2 pm. You should arrive 15 minutes prior to your appointment time for registration. Please follow the written instructions below on the day of your exam:  WARNING: IF YOU ARE ALLERGIC TO IODINE/X-RAY DYE, PLEASE NOTIFY RADIOLOGY IMMEDIATELY AT [phone]! YOU WILL BE GIVEN A 13 HOUR PREMEDICATION PREP.  1) Do not eat or drink anything after 10 am (4 hours prior to your test) 2) You have been given 2 bottles of oral contrast to drink. The solution may taste better if refrigerated, but do NOT add ice or any other liquid to this solution. Shake well before drinking.    Drink 1 bottle of contrast @ 12 pm (2 hours prior to your exam)  Drink 1 bottle of contrast @ 1 pm (1 hour prior to your exam)  You may take any medications as prescribed with a small amount of water except for the following: Metformin, Glucophage, Glucovance, Avandamet, Riomet, Fortamet, Actoplus Met, Janumet, Glumetza or Metaglip. The above medications must be held the day of the exam AND 48 hours after the exam.  The purpose of you drinking the oral contrast is to aid in the visualization of your intestinal tract. The contrast solution may cause some diarrhea. Before your exam is started, you will be given a small amount of fluid to drink. Depending on your individual set of symptoms, you may also receive an  intravenous injection of x-ray contrast/dye. Plan on being at [facility] for 30 minutes or long, depending on the type of exam you are having performed.  If you have any questions regarding your exam or if you need to reschedule, you may call the CT department at [phone] between the hours of 8:00 am and 5:00 pm, Monday-Friday.  ___________________________________________________________________________________________________________________  Rachel Duran have been scheduled for an appointment with Dr. Andrey Campanile at [facility]. Your appointment is on 05-29-2012 at 3pm. Please arrive at 2:30 pm for registration. Make certain to bring a list of current medications, including any over the counter medications or vitamins. Also bring your co-pay if you have one as well as your insurance cards. [facility] is located at [address], [address]. Should you need to reschedule your appointment, please contact them at [phone].

## 2012-05-16 NOTE — Progress Notes (Signed)
Subjective:    Patient ID: Rachel Duran, female    DOB: 05/11/1956, 56 y.o.   MRN: [id number]  HPI Darienne is a 56 year old female known previously to Dr. Russella Dar with history of chronic medication-induced constipation and IBS. She has multiple other medical problems including chronic pain syndrome for which she is on chronic narcotics, interstitial cystitis, severe fibromyalgia and possible rheumatoid arthritis followed by Dr. Titus Dubin. She also has chronic depression, insomnia, cervical  disc disease status post cervical fusion, Raynauds syndrome and porphyria cutanea tarda. 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 She was subsequently sent to the public health department and was initiated on INH in August of 2013. Prior to that time there is a documented set of liver tests from June 2013 which were normal. Patient says initially she felt okay for the first 30 days of therapy but she was also on prednisone at that time and weaned off. Since going off the prednisone she developed generalized weakness, headaches, dizziness ,intermittent sweats, nausea, no fever or chills and began noticing some upper abdominal pain. She had normal LFTs on 04/10/2012, then on 05/01/2012 total bilirubin was 1.1 alkaline phosphatase 221 SGOT 1032 and SGPT of 1176. The INH was stopped at that point now about 2.5 weeks ago. She says at that point she had also started noticing some dark urine which has since improved. Since coming off of the INH none of her other symptoms have improved and she continues to feel some discomfort in her upper abdomen at times radiating into her back. She is eating but says she smaller amounts and has lost about 5 pounds. She had repeat liver function studies done [facility] and we will obtain copies of those In the interim she was seen by primary care and an abdominal  ultrasound was ordered on 05/10/2012 which shows gallstones in a contracted gallbladder there is no wall thickening or pericholecystic fluid noted. Common bile duct is dilated to 12 mm though no filling defect seen in the pancreas was not well visualized.    Review of Systems  Constitutional: Positive for appetite change, fatigue and unexpected weight change.  HENT: Negative.   Eyes: Negative.   Respiratory: Negative.   Cardiovascular: Negative.   Gastrointestinal: Positive for nausea, abdominal pain and constipation.  Genitourinary: Negative.   Musculoskeletal: Positive for back pain and arthralgias.  Neurological: Positive for dizziness.  Hematological: Negative.   Psychiatric/Behavioral: Negative.    Outpatient Encounter Prescriptions as of 05/16/2012  Medication Sig Dispense Refill  . acyclovir (ZOVIRAX) 800 MG tablet TAKE ONE-HALF (1/2) TABLET DAILY  90 tablet  2  . ALPRAZolam (XANAX) 1 MG tablet Take 1 mg by mouth 3 (three) times daily as needed.        . AMITIZA 24 MCG capsule TAKE ONE CAPSULE BY MOUTH ONCE DAILY WITH BREAKFAST  30 each  6  . amLODipine (NORVASC) 5 MG tablet Take 5 mg by mouth daily.        Marland Kitchen amphetamine-dextroamphetamine (ADDERALL XR) 30 MG 24 hr capsule Take 1 capsule (30 mg total) by mouth every morning.  30 capsule  0  . ARIPiprazole (ABILIFY) 10 MG tablet Take 10 mg by mouth daily.        Marland Kitchen aspirin 81 MG tablet Take 81 mg by mouth daily.        . baclofen (LIORESAL) 10 MG tablet TAKE 1 TABLET THREE TIMES  A DAY  270 tablet  2  . Calcium Carbonate-Vitamin D (CALCIUM-VITAMIN D) 500-200 MG-UNIT per tablet Take 1 tablet by mouth 3 (three) times daily with meals.        . Cholecalciferol (D 400 PO) Take by mouth.        Marland Kitchen Co-Enzyme Q-10 60 MG CAPS Take 1 capsule by mouth 3 (three) times daily.      . cyanocobalamin (,VITAMIN B-12,) 1000 MCG/ML injection Inject 1,000 mcg into the muscle every 21 ( twenty-one) days.      . diazepam (DIASTAT) 2.5 MG GEL Place  vaginally as needed.        . doxycycline (VIBRA-TABS) 100 MG tablet Take 1 tablet (100 mg total) by mouth 2 (two) times daily.  20 tablet  0  . Eszopiclone (ESZOPICLONE) 3 MG TABS Take 1 tablet (3 mg total) by mouth at bedtime. Take immediately before bedtime  30 tablet  3  . etodolac (LODINE XL) 400 MG 24 hr tablet Take 400 mg by mouth daily.      . folic acid (FOLVITE) 1 MG tablet Take 2 mg by mouth daily.        . hydroxychloroquine (PLAQUENIL) 200 MG tablet Take by mouth 2 (two) times daily.        Marland Kitchen lamoTRIgine (LAMICTAL) 150 MG tablet Take 150 mg by mouth 2 (two) times daily.        Marland Kitchen lidocaine (LIDODERM) 5 % Place 3 patches onto the skin daily. Remove & Discard patch within 12 hours or as directed by MD       . Manganese 50 MG TABS Take 1 tablet by mouth daily.      . methotrexate (RHEUMATREX) 2.5 MG tablet Take by mouth. 8 tabs per week       . morphine (AVINZA) 90 MG 24 hr capsule Take 1 capsule (90 mg total) by mouth daily.  30 capsule  0  . morphine (AVINZA) 90 MG 24 hr capsule Take 1 capsule (90 mg total) by mouth daily.  30 capsule  0  . morphine (MSIR) 15 MG tablet Take 1 tablet by mouth as needed.      . ondansetron (ZOFRAN) 4 MG tablet Take 1 tablet (4 mg total) by mouth every 8 (eight) hours as needed.  30 tablet  6  . OVER THE COUNTER MEDICATION Take 1,000 mg by mouth 3 (three) times daily.      . pantoprazole (PROTONIX) 40 MG tablet Take 1 tablet (40 mg total) by mouth daily.  90 tablet  3  . pentosan polysulfate (ELMIRON) 100 MG capsule Take 100 mg by mouth. 2 by mouth twice a day        . predniSONE (DELTASONE) 5 MG tablet Take 5 mg by mouth daily. titrating off for 3 weeks      . pregabalin (LYRICA) 150 MG capsule Take 150 mg by mouth 3 (three) times daily.        . Triamcinolone Acetonide (NASACORT AQ NA) 2 sprays by Nasal route daily.        Marland Kitchen VAGIFEM 10 MCG TABS Place 1 tablet vaginally 2 (two) times a week.      . benzonatate (TESSALON) 200 MG capsule Take 1 capsule  (200 mg total) by mouth 3 (three) times daily as needed for cough.  30 capsule  1  . phenazopyridine-butabarbital-hyoscyamine (PHENAZOPYRIDINE PLUS) 150-15-0.3 MG tablet Take 1 tablet by mouth 3 (three) times daily as needed.  60 tablet  3  .  pyridOXINE (B-6) 50 MG tablet Take 50 mg by mouth daily.      Marland Kitchen thiamine (VITAMIN B-1) 100 MG tablet Take 100 mg by mouth daily.       Allergies  Allergen Reactions  . Cymbalta (Duloxetine Hcl)     Urinary retention  . Codeine Phosphate     REACTION: unspecified  . Inh (Isoniazid)     Hepatitis  . Meperidine Hcl     REACTION: red skin  . Sulfamethoxazole     REACTION: unspecified   Patient Active Problem List  Diagnosis  . PORPHYRIA CUTANEA TARDA  . ANEMIA OF CHRONIC DISEASE  . INSOMNIA, CHRONIC  . DEPRESSION  . DISORDERS, ORGNC INSOMNIA D/T MENTAL DISORDER  . [name] SYNDROME  . ALLERGIC RHINITIS  . APHTHOUS ULCERS  . GERD  . CONSTIPATION, DRUG INDUCED  . ACUTE CYSTITIS  . CYSTITIS, CHRONIC INTERSTITIAL  . ROSACEA  . OSTEOARTHRITIS  . NECK PAIN, CHRONIC  . CERVICAL RADICULOPATHY  . UNSPECIFIED SYNOVITIS AND TENOSYNOVITIS  . FIBROMYALGIA, SEVERE  . NEURALGIA  . CRAMP IN LIMB  . SYMPTOM, MALAISE AND FATIGUE NEC  . ABNORMAL WEIGHT GAIN  . WEIGHT LOSS, ABNORMAL  . CHEST PAIN UNSPECIFIED  . Varicose veins of lower extremities with other complications  . Positive QuantiFERON-TB Gold test   History  Substance Use Topics  . Smoking status: Never Smoker   . Smokeless tobacco: Never Used  . Alcohol Use: No   History  Substance Use Topics  . Smoking status: Never Smoker   . Smokeless tobacco: Never Used  . Alcohol Use: No      Objective:   Physical Exam well-developed white female in no acute distress, pleasant accompanied by her husband. Blood pressure 102/56 pulse 88 height 5 foot 5 weight 139. HEENT; nontraumatic normocephalic EOMI PERRLA sclera. Minimally icteric, Neck ;supple no JVD she has had prior cervical fusion and  incisional scars, Cardiovascular; regular rate and rhythm with S1-S2 no murmur or gallop, Pulmonary; clear bilaterally, Abdomen ;soft she is tender in the epigastrium there is no guarding or rebound no palpable mass or hepatosplenomegaly bowel sounds are active, Extremities; no clubbing cyanosis or edema skin warm and dry there is no obvious jaundice. Psych; mood and affect normal and appropriate.        Assessment & Plan:  #80 56 year old female with multiple medical problems presenting with abnormal liver function studies him a primarily a transaminitis and initially felt secondary to a drug hepatitis from INH which had been started 2 months previous. Patient also with symptoms of nausea weakness and upper abdominal discomfort, and upper abnormal ultrasound also confirms cholelithiasis, and a dilated common bile duct without definite filling defect or mass. She may have choledocholithiasis or other biliary obstruction contributing to her symptoms and abnormal liver function studies.  #2 chronic narcotic use #3 Severe  fibromyalgia and probable other rheumatologic disorder//rheumatoid arthritis-being considered for biologic therapy by rheumatology, as methotrexate ineffective #4 positive TB -started INH about 2 months ago, discontinued 3 weeks ago due to possible drug-induced hepatitis #5 depression #6 Raynauds #7 interstitial cystitis #8 Porphyria  cutanea tarda #9 cervical disc disease status post  cervical fusion  Plan; obtain copies of liver function studies drawn yesterday, and will check CBC pro time/ INR and acute hepatitis serologies today. Long discussion with patient and her husband regarding possible drug-induced hepatitis and/or later obstruction accounting for her abnormal liver tests and some of her constitutional symptoms . Ideally would like her to have an MRI  with MRCP prior to proceeding with ERCP, however patient is adamant that she cannot go into irregular MRI machine without  heavy sedation. We will look into the possibility of obtaining an MRCP in an open scanner.  I discussed case with Dr. Almyra Deforest will schedule her for ERCP with propofol next week . Procedure was discussed in detail with the patient and her husband including 5-6% risk of pancreatitis and they are agreeable to proceed. If MRCP can be obtained prior to that time this information will be helpful.  Will also arrange for surgical consultation for laparoscopic cholecystectomy.  Addendum-  Unable to schedule open MRI/MRCP prior to ERCP. Will schedule CT ABD/Pelvis with contrast

## 2012-05-16 NOTE — Progress Notes (Signed)
Reviewed and agree with management. I discussed the case with Dr. Lovell Sheehan yesterday and with Mike Gip, [facility] today while the patient was in the office.

## 2012-05-16 NOTE — Telephone Encounter (Signed)
You have been scheduled for a CT scan of the abdomen and pelvis at [facility] ([address]---this is in the same building as [facility]).   You are scheduled on 05-18-2011 at 2 pm. You should arrive 15 minutes prior to your appointment time for registration. Please follow the written instructions below on the day of your exam:  WARNING: IF YOU ARE ALLERGIC TO IODINE/X-RAY DYE, PLEASE NOTIFY RADIOLOGY IMMEDIATELY AT [phone]! YOU WILL BE GIVEN A 13 HOUR PREMEDICATION PREP.  1) Do not eat or drink anything after 10 am (4 hours prior to your test) 2) You have been given 2 bottles of oral contrast to drink. The solution may taste better if refrigerated, but do NOT add ice or any other liquid to this solution. Shake well before drinking.    Drink 1 bottle of contrast @ 12 pm (2 hours prior to your exam)  Drink 1 bottle of contrast @ 1 pm (1 hour prior to your exam)  You may take any medications as prescribed with a small amount of water except for the following: Metformin, Glucophage, Glucovance, Avandamet, Riomet, Fortamet, Actoplus Met, Janumet, Glumetza or Metaglip. The above medications must be held the day of the exam AND 48 hours after the exam.  The purpose of you drinking the oral contrast is to aid in the visualization of your intestinal tract. The contrast solution may cause some diarrhea. Before your exam is started, you will be given a small amount of fluid to drink. Depending on your individual set of symptoms, you may also receive an intravenous injection of x-ray contrast/dye. Plan on being at [facility] for 30 minutes or long, depending on the type of exam you are having performed.  If you have any questions regarding your exam or if you need to reschedule, you may call the CT department at [phone] between the hours of 8:00 am and 5:00 pm, Monday-Friday.   _________________________________________________________________   [facility] appointment is  scheduled for Tuesday November 5 at 3 pm with Dr. Andrey Campanile. Please arrive at 2:30 pm for registration.    Called patient with both appointment dates and patient verbalized understanding. Patient will come office and pick up contrast and new patient instructions.

## 2012-05-17 ENCOUNTER — Ambulatory Visit (INDEPENDENT_AMBULATORY_CARE_PROVIDER_SITE_OTHER)
Admission: RE | Admit: 2012-05-17 | Discharge: 2012-05-17 | Disposition: A | Discharge status: Home / Self Care | Payer: 59 | Source: Ambulatory Visit | Attending: Physician Assistant | Admitting: Physician Assistant

## 2012-05-17 DIAGNOSIS — K838 Other specified diseases of biliary tract: Secondary | ICD-10-CM

## 2012-05-17 DIAGNOSIS — R7989 Other specified abnormal findings of blood chemistry: Secondary | ICD-10-CM

## 2012-05-17 MED ORDER — IOHEXOL 300 MG/ML  SOLN
100.0000 mL | Freq: Once | INTRAMUSCULAR | Status: AC | PRN
  Administered 2012-05-17: 100 mL via INTRAVENOUS

## 2012-05-21 ENCOUNTER — Telehealth: Payer: Self-pay | Admitting: Gastroenterology

## 2012-05-21 NOTE — Telephone Encounter (Signed)
Spoke with patient and answered her questions. Results given

## 2012-05-22 ENCOUNTER — Encounter (HOSPITAL_COMMUNITY): Payer: Self-pay | Admitting: *Deleted

## 2012-05-22 ENCOUNTER — Ambulatory Visit (HOSPITAL_COMMUNITY): Payer: 59 | Admitting: Anesthesiology

## 2012-05-22 ENCOUNTER — Ambulatory Visit (HOSPITAL_COMMUNITY)
Admission: RE | Admit: 2012-05-22 | Discharge: 2012-05-22 | Disposition: A | Discharge status: Home / Self Care | Payer: 59 | Source: Ambulatory Visit | Attending: Gastroenterology | Admitting: Gastroenterology

## 2012-05-22 ENCOUNTER — Encounter (HOSPITAL_COMMUNITY)
Admission: RE | Disposition: A | Discharge status: Home / Self Care | Payer: Self-pay | Source: Ambulatory Visit | Attending: Gastroenterology

## 2012-05-22 ENCOUNTER — Ambulatory Visit (HOSPITAL_COMMUNITY): Payer: 59

## 2012-05-22 ENCOUNTER — Encounter (HOSPITAL_COMMUNITY): Payer: Self-pay | Admitting: Anesthesiology

## 2012-05-22 DIAGNOSIS — R7402 Elevation of levels of lactic acid dehydrogenase (LDH): Secondary | ICD-10-CM

## 2012-05-22 DIAGNOSIS — F329 Major depressive disorder, single episode, unspecified: Secondary | ICD-10-CM | POA: Insufficient documentation

## 2012-05-22 DIAGNOSIS — Z79899 Other long term (current) drug therapy: Secondary | ICD-10-CM | POA: Insufficient documentation

## 2012-05-22 DIAGNOSIS — F3289 Other specified depressive episodes: Secondary | ICD-10-CM | POA: Insufficient documentation

## 2012-05-22 DIAGNOSIS — K5900 Constipation, unspecified: Secondary | ICD-10-CM | POA: Insufficient documentation

## 2012-05-22 DIAGNOSIS — R7401 Elevation of levels of liver transaminase levels: Secondary | ICD-10-CM

## 2012-05-22 DIAGNOSIS — R7989 Other specified abnormal findings of blood chemistry: Secondary | ICD-10-CM

## 2012-05-22 DIAGNOSIS — K838 Other specified diseases of biliary tract: Secondary | ICD-10-CM

## 2012-05-22 DIAGNOSIS — G894 Chronic pain syndrome: Secondary | ICD-10-CM | POA: Insufficient documentation

## 2012-05-22 DIAGNOSIS — Z981 Arthrodesis status: Secondary | ICD-10-CM | POA: Insufficient documentation

## 2012-05-22 DIAGNOSIS — R1011 Right upper quadrant pain: Secondary | ICD-10-CM

## 2012-05-22 DIAGNOSIS — R932 Abnormal findings on diagnostic imaging of liver and biliary tract: Secondary | ICD-10-CM

## 2012-05-22 DIAGNOSIS — I7300 Raynaud's syndrome without gangrene: Secondary | ICD-10-CM | POA: Insufficient documentation

## 2012-05-22 DIAGNOSIS — N3010 Interstitial cystitis (chronic) without hematuria: Secondary | ICD-10-CM | POA: Insufficient documentation

## 2012-05-22 DIAGNOSIS — IMO0001 Reserved for inherently not codable concepts without codable children: Secondary | ICD-10-CM | POA: Insufficient documentation

## 2012-05-22 DIAGNOSIS — K589 Irritable bowel syndrome without diarrhea: Secondary | ICD-10-CM | POA: Insufficient documentation

## 2012-05-22 DIAGNOSIS — K219 Gastro-esophageal reflux disease without esophagitis: Secondary | ICD-10-CM | POA: Insufficient documentation

## 2012-05-22 HISTORY — DX: Nausea with vomiting, unspecified: R11.2

## 2012-05-22 HISTORY — PX: ERCP: SHX5425

## 2012-05-22 HISTORY — DX: Other specified postprocedural states: Z98.890

## 2012-05-22 SURGERY — ERCP, WITH INTERVENTION IF INDICATED
Anesthesia: Monitor Anesthesia Care

## 2012-05-22 MED ORDER — CIPROFLOXACIN IN D5W 400 MG/200ML IV SOLN
INTRAVENOUS | Status: AC
  Filled 2012-05-22: qty 200

## 2012-05-22 MED ORDER — GLUCAGON HCL (RDNA) 1 MG IJ SOLR
INTRAMUSCULAR | Status: AC
  Filled 2012-05-22: qty 2

## 2012-05-22 MED ORDER — BUTAMBEN-TETRACAINE-BENZOCAINE 2-2-14 % EX AERO
INHALATION_SPRAY | CUTANEOUS | Status: DC | PRN
  Administered 2012-05-22: 2 via TOPICAL

## 2012-05-22 MED ORDER — SODIUM CHLORIDE 0.9 % IV SOLN
INTRAVENOUS | Status: DC | PRN
  Administered 2012-05-22: 10:00:00

## 2012-05-22 MED ORDER — ONDANSETRON HCL 4 MG/2ML IJ SOLN
INTRAMUSCULAR | Status: DC | PRN
  Administered 2012-05-22: 4 mg via INTRAVENOUS

## 2012-05-22 MED ORDER — CIPROFLOXACIN IN D5W 400 MG/200ML IV SOLN
INTRAVENOUS | Status: DC | PRN
  Administered 2012-05-22: 400 mg via INTRAVENOUS

## 2012-05-22 MED ORDER — SODIUM CHLORIDE 0.9 % IV SOLN
INTRAVENOUS | Status: DC
  Administered 2012-05-22: 09:00:00 via INTRAVENOUS

## 2012-05-22 MED ORDER — MIDAZOLAM HCL 5 MG/5ML IJ SOLN
INTRAMUSCULAR | Status: DC | PRN
  Administered 2012-05-22: 2 mg via INTRAVENOUS

## 2012-05-22 MED ORDER — PROPOFOL 10 MG/ML IV EMUL
INTRAVENOUS | Status: DC | PRN
  Administered 2012-05-22: 100 ug/kg/min via INTRAVENOUS

## 2012-05-22 NOTE — H&P (View-Only) (Signed)
Subjective:    Patient ID: Rachel Duran, female    DOB: 08/21/1955, 56 y.o.   MRN: [id number]  HPI Rachel Duran is a 56-year-old female known previously to Dr. Stark with history of chronic medication-induced constipation and IBS. She has multiple other medical problems including chronic pain syndrome for which she is on chronic narcotics, interstitial cystitis, severe fibromyalgia and possible rheumatoid arthritis followed by Dr. Devashwar. She also has chronic depression, insomnia, cervical  disc disease status post cervical fusion, Raynauds syndrome and porphyria cutanea tarda. 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 She was subsequently sent to the public health department and was initiated on INH in August of 2013. Prior to that time there is a documented set of liver tests from June 2013 which were normal. Patient says initially she felt okay for the first 30 days of therapy but she was also on prednisone at that time and weaned off. Since going off the prednisone she developed generalized weakness, headaches, dizziness ,intermittent sweats, nausea, no fever or chills and began noticing some upper abdominal pain. She had normal LFTs on 04/10/2012, then on 05/01/2012 total bilirubin was 1.1 alkaline phosphatase 221 SGOT 1032 and SGPT of 1176. The INH was stopped at that point now about 2.5 weeks ago. She says at that point she had also started noticing some dark urine which has since improved. Since coming off of the INH none of her other symptoms have improved and she continues to feel some discomfort in her upper abdomen at times radiating into her back. She is eating but says she smaller amounts and has lost about 5 pounds. She had repeat liver function studies done [facility] at the [facility] and we will obtain copies of those In the interim she was seen by primary care and an abdominal  ultrasound was ordered on 05/10/2012 which shows gallstones in a contracted gallbladder there is no wall thickening or pericholecystic fluid noted. Common bile duct is dilated to 12 mm though no filling defect seen in the pancreas was not well visualized.    Review of Systems  Constitutional: Positive for appetite change, fatigue and unexpected weight change.  HENT: Negative.   Eyes: Negative.   Respiratory: Negative.   Cardiovascular: Negative.   Gastrointestinal: Positive for nausea, abdominal pain and constipation.  Genitourinary: Negative.   Musculoskeletal: Positive for back pain and arthralgias.  Neurological: Positive for dizziness.  Hematological: Negative.   Psychiatric/Behavioral: Negative.    Outpatient Encounter Prescriptions as of 05/16/2012  Medication Sig Dispense Refill  . acyclovir (ZOVIRAX) 800 MG tablet TAKE ONE-HALF (1/2) TABLET DAILY  90 tablet  2  . ALPRAZolam (XANAX) 1 MG tablet Take 1 mg by mouth 3 (three) times daily as needed.        . AMITIZA 24 MCG capsule TAKE ONE CAPSULE BY MOUTH ONCE DAILY WITH BREAKFAST  30 each  6  . amLODipine (NORVASC) 5 MG tablet Take 5 mg by mouth daily.        . amphetamine-dextroamphetamine (ADDERALL XR) 30 MG 24 hr capsule Take 1 capsule (30 mg total) by mouth every morning.  30 capsule  0  . ARIPiprazole (ABILIFY) 10 MG tablet Take 10 mg by mouth daily.        . aspirin 81 MG tablet Take 81 mg by mouth daily.        . baclofen (LIORESAL) 10 MG tablet TAKE 1 TABLET THREE TIMES   A DAY  270 tablet  2  . Calcium Carbonate-Vitamin D (CALCIUM-VITAMIN D) 500-200 MG-UNIT per tablet Take 1 tablet by mouth 3 (three) times daily with meals.        . Cholecalciferol (D 400 PO) Take by mouth.        . Co-Enzyme Q-10 60 MG CAPS Take 1 capsule by mouth 3 (three) times daily.      . cyanocobalamin (,VITAMIN B-12,) 1000 MCG/ML injection Inject 1,000 mcg into the muscle every 21 ( twenty-one) days.      . diazepam (DIASTAT) 2.5 MG GEL Place  vaginally as needed.        . doxycycline (VIBRA-TABS) 100 MG tablet Take 1 tablet (100 mg total) by mouth 2 (two) times daily.  20 tablet  0  . Eszopiclone (ESZOPICLONE) 3 MG TABS Take 1 tablet (3 mg total) by mouth at bedtime. Take immediately before bedtime  30 tablet  3  . etodolac (LODINE XL) 400 MG 24 hr tablet Take 400 mg by mouth daily.      . folic acid (FOLVITE) 1 MG tablet Take 2 mg by mouth daily.        . hydroxychloroquine (PLAQUENIL) 200 MG tablet Take by mouth 2 (two) times daily.        . lamoTRIgine (LAMICTAL) 150 MG tablet Take 150 mg by mouth 2 (two) times daily.        . lidocaine (LIDODERM) 5 % Place 3 patches onto the skin daily. Remove & Discard patch within 12 hours or as directed by MD       . Manganese 50 MG TABS Take 1 tablet by mouth daily.      . methotrexate (RHEUMATREX) 2.5 MG tablet Take by mouth. 8 tabs per week       . morphine (AVINZA) 90 MG 24 hr capsule Take 1 capsule (90 mg total) by mouth daily.  30 capsule  0  . morphine (AVINZA) 90 MG 24 hr capsule Take 1 capsule (90 mg total) by mouth daily.  30 capsule  0  . morphine (MSIR) 15 MG tablet Take 1 tablet by mouth as needed.      . ondansetron (ZOFRAN) 4 MG tablet Take 1 tablet (4 mg total) by mouth every 8 (eight) hours as needed.  30 tablet  6  . OVER THE COUNTER MEDICATION Take 1,000 mg by mouth 3 (three) times daily.      . pantoprazole (PROTONIX) 40 MG tablet Take 1 tablet (40 mg total) by mouth daily.  90 tablet  3  . pentosan polysulfate (ELMIRON) 100 MG capsule Take 100 mg by mouth. 2 by mouth twice a day        . predniSONE (DELTASONE) 5 MG tablet Take 5 mg by mouth daily. titrating off for 3 weeks      . pregabalin (LYRICA) 150 MG capsule Take 150 mg by mouth 3 (three) times daily.        . Triamcinolone Acetonide (NASACORT AQ NA) 2 sprays by Nasal route daily.        . VAGIFEM 10 MCG TABS Place 1 tablet vaginally 2 (two) times a week.      . benzonatate (TESSALON) 200 MG capsule Take 1 capsule  (200 mg total) by mouth 3 (three) times daily as needed for cough.  30 capsule  1  . phenazopyridine-butabarbital-hyoscyamine (PHENAZOPYRIDINE PLUS) 150-15-0.3 MG tablet Take 1 tablet by mouth 3 (three) times daily as needed.  60 tablet  3  .   pyridOXINE (B-6) 50 MG tablet Take 50 mg by mouth daily.      . thiamine (VITAMIN B-1) 100 MG tablet Take 100 mg by mouth daily.       Allergies  Allergen Reactions  . Cymbalta (Duloxetine Hcl)     Urinary retention  . Codeine Phosphate     REACTION: unspecified  . Inh (Isoniazid)     Hepatitis  . Meperidine Hcl     REACTION: red skin  . Sulfamethoxazole     REACTION: unspecified   Patient Active Problem List  Diagnosis  . PORPHYRIA CUTANEA TARDA  . ANEMIA OF CHRONIC DISEASE  . INSOMNIA, CHRONIC  . DEPRESSION  . DISORDERS, ORGNC INSOMNIA D/T MENTAL DISORDER  . [name] SYNDROME  . ALLERGIC RHINITIS  . APHTHOUS ULCERS  . GERD  . CONSTIPATION, DRUG INDUCED  . ACUTE CYSTITIS  . CYSTITIS, CHRONIC INTERSTITIAL  . ROSACEA  . OSTEOARTHRITIS  . NECK PAIN, CHRONIC  . CERVICAL RADICULOPATHY  . UNSPECIFIED SYNOVITIS AND TENOSYNOVITIS  . FIBROMYALGIA, SEVERE  . NEURALGIA  . CRAMP IN LIMB  . SYMPTOM, MALAISE AND FATIGUE NEC  . ABNORMAL WEIGHT GAIN  . WEIGHT LOSS, ABNORMAL  . CHEST PAIN UNSPECIFIED  . Varicose veins of lower extremities with other complications  . Positive QuantiFERON-TB Gold test   History  Substance Use Topics  . Smoking status: Never Smoker   . Smokeless tobacco: Never Used  . Alcohol Use: No   History  Substance Use Topics  . Smoking status: Never Smoker   . Smokeless tobacco: Never Used  . Alcohol Use: No      Objective:   Physical Exam well-developed white female in no acute distress, pleasant accompanied by her husband. Blood pressure 102/56 pulse 88 height 5 foot 5 weight 139. HEENT; nontraumatic normocephalic EOMI PERRLA sclera. Minimally icteric, Neck ;supple no JVD she has had prior cervical fusion and  incisional scars, Cardiovascular; regular rate and rhythm with S1-S2 no murmur or gallop, Pulmonary; clear bilaterally, Abdomen ;soft she is tender in the epigastrium there is no guarding or rebound no palpable mass or hepatosplenomegaly bowel sounds are active, Extremities; no clubbing cyanosis or edema skin warm and dry there is no obvious jaundice. Psych; mood and affect normal and appropriate.        Assessment & Plan:  #1 56-year-old female with multiple medical problems presenting with abnormal liver function studies him a primarily a transaminitis and initially felt secondary to a drug hepatitis from INH which had been started 2 months previous. Patient also with symptoms of nausea weakness and upper abdominal discomfort, and upper abnormal ultrasound also confirms cholelithiasis, and a dilated common bile duct without definite filling defect or mass. She may have choledocholithiasis or other biliary obstruction contributing to her symptoms and abnormal liver function studies.  #2 chronic narcotic use #3 Severe  fibromyalgia and probable other rheumatologic disorder//rheumatoid arthritis-being considered for biologic therapy by rheumatology, as methotrexate ineffective #4 positive TB -started INH about 2 months ago, discontinued 3 weeks ago due to possible drug-induced hepatitis #5 depression #6 Raynauds #7 interstitial cystitis #8 Porphyria  cutanea tarda #9 cervical disc disease status post  cervical fusion  Plan; obtain copies of liver function studies drawn yesterday, and will check CBC pro time/ INR and acute hepatitis serologies today. Long discussion with patient and her husband regarding possible drug-induced hepatitis and/or later obstruction accounting for her abnormal liver tests and some of her constitutional symptoms . Ideally would like her to have an MRI   with MRCP prior to proceeding with ERCP, however patient is adamant that she cannot go into irregular MRI machine without  heavy sedation. We will look into the possibility of obtaining an MRCP in an open scanner.  I discussed case with Dr. Stark-we will schedule her for ERCP with propofol next week . Procedure was discussed in detail with the patient and her husband including 5-6% risk of pancreatitis and they are agreeable to proceed. If MRCP can be obtained prior to that time this information will be helpful.  Will also arrange for surgical consultation for laparoscopic cholecystectomy.  Addendum-  Unable to schedule open MRI/MRCP prior to ERCP. Will schedule CT ABD/Pelvis with contrast 

## 2012-05-22 NOTE — Op Note (Signed)
[facility] [address] [place], [address]   ERCP PROCEDURE REPORT  PATIENT: Rachel Duran, Rachel Duran.  MR# :[id number] BIRTHDATE: 10/26/1955  GENDER: Female ENDOSCOPIST: Meryl Dare, MD, [facility] PROCEDURE DATE:  05/22/2012 PROCEDURE:   ERCP with sphincterotomy/papillotomy and ERCP with removal of calculus/calculi ASA CLASS:   Class III INDICATIONS:RUQ abdominal pain, abnormal abdominal ultrasound, biliary dilation on ultrasound, abnormal liver function test . MEDICATIONS: MAC sedation, administered by CRNA, propofol (Diprivan) 185mg  IV, and Glucagon 0.5 mg IV TOPICAL ANESTHETIC: Cetacaine Spray DESCRIPTION OF PROCEDURE:   After the risks benefits and alternatives of the procedure were thoroughly explained, informed consent was obtained.  The Pentax ERCP [id number]  endoscope was introduced through the mouth  and advanced to the second portion of the duodenum . 1.  The ampulla was located in the second portion of the duodenum. The ampulla appeared normal. The CBD was cannulated with a guidewire. 2.  With guidewire in the bile duct, a biliary sphincterotomy was performed using the sphincterotome. 3.  There was diffuse dilation of the common bile duct and mild dilation of the intrahepatic ducts. The proximal CBD measured about 15 mm. No definite stone noted.  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 Balloon cather pull through 3 times with good drainage. 4.  The PD was not cannulated or injected by intention. The scope was then completely withdrawn from the patient and the procedure completed.     COMPLICATIONS: none  ENDOSCOPIC IMPRESSION: 1.   Normal appearing ampulla, biliary sphincterotomy performed 2.   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   Dilation of the common bile duct and mild dilation of the intrahepatic ducts.  RECOMMENDATIONS: 1.  liver enzymes in 4-6 weeks 2.  surgery consultation as scheduled   eSigned:  Meryl Dare, MD, [facility]  05/22/2012 10:29 AM   AO:ZHYQ Carolynn Sayers, MD

## 2012-05-22 NOTE — Anesthesia Preprocedure Evaluation (Addendum)
Anesthesia Evaluation  Patient identified by MRN, date of birth, ID band Patient awake    Reviewed: Allergy & Precautions, H&P , NPO status , Patient's Chart, lab work & pertinent test results  History of Anesthesia Complications (+) PONV  Airway Mallampati: II TM Distance: >3 FB Neck ROM: Full    Dental  (+) Teeth Intact and Dental Advisory Given   Pulmonary neg pulmonary ROS,  breath sounds clear to auscultation  Pulmonary exam normal       Cardiovascular negative cardio ROS  Rhythm:Regular Rate:Normal     Neuro/Psych PSYCHIATRIC DISORDERS Depression  Neuromuscular disease negative psych ROS   GI/Hepatic negative GI ROS, Neg liver ROS, GERD-  Medicated,Porphyria Cutanea Tarda IBS   Endo/Other  negative endocrine ROS  Renal/GU negative Renal ROS  negative genitourinary   Musculoskeletal  (+) Fibromyalgia -  Abdominal   Peds negative pediatric ROS (+)  Hematology negative hematology ROS (+)   Anesthesia Other Findings   Reproductive/Obstetrics negative OB ROS                          Anesthesia Physical Anesthesia Plan  ASA: III  Anesthesia Plan: MAC   Post-op Pain Management:    Induction: Intravenous  Airway Management Planned: Nasal Cannula  Additional Equipment:   Intra-op Plan:   Post-operative Plan:   Informed Consent: I have reviewed the patients History and Physical, chart, labs and discussed the procedure including the risks, benefits and alternatives for the proposed anesthesia with the patient or authorized representative who has indicated his/her understanding and acceptance.   Dental advisory given  Plan Discussed with: CRNA  Anesthesia Plan Comments:         Anesthesia Quick Evaluation

## 2012-05-22 NOTE — Interval H&P Note (Signed)
History and Physical Interval Note:  05/22/2012 8:58 AM  Rachel Duran  has presented today for surgery, with the diagnosis of Elevated LFTs Dilated Bile Duct   The various methods of treatment have been discussed with the patient and family. After consideration of risks, benefits and other options for treatment, the patient has consented to  Procedure(s) (LRB) with comments: ENDOSCOPIC RETROGRADE CHOLANGIOPANCREATOGRAPHY (ERCP) (N/A) as a surgical intervention .  The patient's history has been reviewed, patient examined, no change in status, stable for surgery.  I have reviewed the patient's chart and labs.  Questions were answered to the patient's satisfaction.     Venita Lick. Russella Dar MD Clementeen Graham

## 2012-05-22 NOTE — Transfer of Care (Signed)
Immediate Anesthesia Transfer of Care Note  Patient: Rachel Duran  Procedure(s) Performed: Procedure(s) (LRB) with comments: ENDOSCOPIC RETROGRADE CHOLANGIOPANCREATOGRAPHY (ERCP) (N/A)  Patient Location: PACU  Anesthesia Type:MAC  Level of Consciousness: awake and sedated  Airway & Oxygen Therapy: Patient Spontanous Breathing and Patient connected to nasal cannula oxygen  Post-op Assessment: Report given to PACU RN and Post -op Vital signs reviewed and stable  Post vital signs: Reviewed and stable  Complications: No apparent anesthesia complications

## 2012-05-23 ENCOUNTER — Other Ambulatory Visit: Payer: Self-pay

## 2012-05-23 ENCOUNTER — Encounter (HOSPITAL_COMMUNITY): Payer: Self-pay | Admitting: Gastroenterology

## 2012-05-23 ENCOUNTER — Other Ambulatory Visit: Payer: 59

## 2012-05-23 DIAGNOSIS — R7401 Elevation of levels of liver transaminase levels: Secondary | ICD-10-CM

## 2012-05-23 NOTE — Anesthesia Postprocedure Evaluation (Signed)
Anesthesia Post Note  Patient: Rachel Duran  Procedure(s) Performed: Procedure(s) (LRB): ENDOSCOPIC RETROGRADE CHOLANGIOPANCREATOGRAPHY (ERCP) (N/A)  Anesthesia type: MAC  Patient location: PACU  Post pain: Pain level controlled  Post assessment: Post-op Vital signs reviewed  Last Vitals:  Filed Vitals:   05/22/12 1052  BP: 108/67  Temp:   Resp:     Post vital signs: Reviewed  Level of consciousness: sedated  Complications: No apparent anesthesia complications

## 2012-05-28 ENCOUNTER — Encounter: Payer: Self-pay | Admitting: Internal Medicine

## 2012-05-29 ENCOUNTER — Ambulatory Visit (INDEPENDENT_AMBULATORY_CARE_PROVIDER_SITE_OTHER): Payer: 59 | Admitting: General Surgery

## 2012-05-29 ENCOUNTER — Encounter (INDEPENDENT_AMBULATORY_CARE_PROVIDER_SITE_OTHER): Payer: Self-pay | Admitting: General Surgery

## 2012-05-29 VITALS — BP 122/64 | HR 84 | Temp 97.7°F | Resp 12 | Ht 66.0 in | Wt 144.4 lb

## 2012-05-29 DIAGNOSIS — K8020 Calculus of gallbladder without cholecystitis without obstruction: Secondary | ICD-10-CM

## 2012-05-29 NOTE — Progress Notes (Signed)
Patient ID: [name], female   DOB: 06-Sep-1955, 56 y.o.   MRN: [id number]  Chief Complaint  Patient presents with  . Pre-op Exam    eval GB    HPI Rachel Duran is a 56 y.o. female.   HPI 56 YO WF referred by Dr Russella Dar for evaluation of gallstones. The patient states that she has severe rheumatoid arthritis as well as fibromyalgia and was in the process of getting placed on a new type of treatment for these medical problems that required her to have a TB test  Several months ago. 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 4 weeks after starting INH, she reports that she started having fatigue, weakness, headaches, sweats, and right upper quadrant abdominal pain. She states that her liver function tests were elevated. Initially it was thought that her elevated LFTs were due to the INH. She underwent abdominal ultrasound which showed gallstones as well as dilated bile ducts. This prompted a CT scan which confirmed the findings. During this time she was still having intermittent right upper quadrant pain radiating to her back. The pain was essentially always there however it was manageable and more of just an ache. However at times it would increase in intensity for 30-40 minutes at a time. It is often associated with nausea as well as some bloating. She ended up seeing gastroenterology and underwent an ERCP on October 29. She had a sphincterotomy. The bile ducts were dilated. There is no evidence of gallstones in the bile duct. There is a question of ampullary stenosis. She states that she has recovered uneventfully from her ERCP however she continues to have intermittent right upper quadrant pain. Her LFTs are trending back down toward normal. She states that her appetite is still abnormal. She does have a bowel movement about every day; however she is on MiraLAX. She does take a long-acting narcotic for her fibromyalgia. She states she did have yellowing of the eyes  when her LFTs were really elevated in early October. She denies any acholic stools. She denies any reflux. She denies any NSAID use. Past Medical History  Diagnosis Date  . Disorders of porphyrin metabolism   . Irritable bowel syndrome   . Fibromyalgia   . Arthritis   . GERD (gastroesophageal reflux disease)   . Depression   . Allergy   . Chronic pain   . Cholecystitis   . Internal hemorrhoids   . PONV (postoperative nausea and vomiting)     Past Surgical History  Procedure Date  . Cervical fusion   . Cervical lamination   . Cystoscopy   . Hernia repair   . Nasal sinus surgery   . Gyn surgery   . Hysteroscopy with ablation   . Breast enhancement surgery 2010  . Nasal sinus surgery     x5  . Ercp 05/22/2012    Procedure: ENDOSCOPIC RETROGRADE CHOLANGIOPANCREATOGRAPHY (ERCP);  Surgeon: Meryl Dare, MD,FACG;  Location: [facility];  Service: Endoscopy;  Laterality: N/A;  . Cesarean section     Family History  Problem Relation Age of Onset  . Heart disease Father   . Esophageal cancer Father   . Scleroderma Mother   . Cancer Mother     esophageal & stomach  . Cancer Paternal Grandfather     esophageal & stomach    Social History History  Substance Use Topics  . Smoking status: Never Smoker   . Smokeless tobacco: Never Used  .  Alcohol Use: No    Allergies  Allergen Reactions  . Codeine Phosphate Nausea And Vomiting    Severe n&v  . Cymbalta (Duloxetine Hcl)     Urinary retention  . Inh (Isoniazid)     Hepatitis  . Meperidine Hcl Rash    REACTION: red skin  . Sulfamethoxazole Rash    Current Outpatient Prescriptions  Medication Sig Dispense Refill  . acyclovir (ZOVIRAX) 800 MG tablet TAKE ONE-HALF (1/2) TABLET DAILY  90 tablet  2  . ALPRAZolam (XANAX) 1 MG tablet Take 1 mg by mouth 3 (three) times daily as needed.        . AMITIZA 24 MCG capsule TAKE ONE CAPSULE BY MOUTH ONCE DAILY WITH BREAKFAST  30 each  6  . amLODipine (NORVASC) 5 MG tablet Take  5 mg by mouth daily.        Marland Kitchen amphetamine-dextroamphetamine (ADDERALL XR) 30 MG 24 hr capsule Take 1 capsule (30 mg total) by mouth every morning.  30 capsule  0  . ARIPiprazole (ABILIFY) 10 MG tablet Take 10 mg by mouth daily.        Marland Kitchen aspirin 81 MG tablet Take 81 mg by mouth daily.        . baclofen (LIORESAL) 10 MG tablet TAKE 1 TABLET THREE TIMES A DAY  270 tablet  2  . Calcium Carbonate-Vitamin D (CALCIUM-VITAMIN D) 500-200 MG-UNIT per tablet Take 1 tablet by mouth 3 (three) times daily with meals.        . Cholecalciferol (D 400 PO) Take by mouth.        . cyanocobalamin (,VITAMIN B-12,) 1000 MCG/ML injection Inject 1,000 mcg into the muscle every 21 ( twenty-one) days.      . Eszopiclone (ESZOPICLONE) 3 MG TABS Take 1 tablet (3 mg total) by mouth at bedtime. Take immediately before bedtime  30 tablet  3  . etodolac (LODINE XL) 400 MG 24 hr tablet Take 400 mg by mouth daily.      Marland Kitchen lamoTRIgine (LAMICTAL) 150 MG tablet Take 150 mg by mouth 2 (two) times daily.        Marland Kitchen lidocaine (LIDODERM) 5 % Place 3 patches onto the skin daily. Remove & Discard patch within 12 hours or as directed by MD       . Manganese 50 MG TABS Take 1 tablet by mouth daily.      Marland Kitchen morphine (AVINZA) 90 MG 24 hr capsule Take 1 capsule (90 mg total) by mouth daily.  30 capsule  0  . morphine (MSIR) 15 MG tablet Take 1 tablet by mouth as needed.      . ondansetron (ZOFRAN) 4 MG tablet Take 1 tablet (4 mg total) by mouth every 8 (eight) hours as needed.  30 tablet  6  . OVER THE COUNTER MEDICATION Take 1,000 mg by mouth 3 (three) times daily.      . pantoprazole (PROTONIX) 40 MG tablet Take 1 tablet (40 mg total) by mouth daily.  90 tablet  3  . pentosan polysulfate (ELMIRON) 100 MG capsule Take 100 mg by mouth. 2 by mouth twice a day        . pregabalin (LYRICA) 150 MG capsule Take 150 mg by mouth 3 (three) times daily.        . Triamcinolone Acetonide (NASACORT AQ NA) 2 sprays by Nasal route daily.        Marland Kitchen VAGIFEM 10  MCG TABS Place 1 tablet vaginally 2 (two) times a week.      . [  DISCONTINUED] [employer] SLOW RELEASE IRON 143 (45 FE) MG TBCR TAKE 1 TABLET EVERY DAY  30 tablet  0    Review of Systems Review of Systems  Constitutional: Positive for appetite change and fatigue. Negative for fever, activity change and unexpected weight change.  HENT: Negative for hearing loss, nosebleeds and neck pain.   Eyes: Negative for photophobia and visual disturbance.  Respiratory: Negative for chest tightness, shortness of breath and wheezing.   Cardiovascular: Negative for chest pain, palpitations and leg swelling.       Denies CP, SOB, DOE, PND, orthopnea  Gastrointestinal:       See hpi  Genitourinary: Negative for dysuria, hematuria and difficulty urinating.  Musculoskeletal:       Takes long acting narcotics for severe fibromyalgia; jt pain, RA  Neurological: Positive for weakness. Negative for tremors, seizures, syncope, facial asymmetry, speech difficulty, light-headedness and headaches.  Hematological: Negative for adenopathy. Does not bruise/bleed easily.  Psychiatric/Behavioral: Negative for behavioral problems and confusion.    Blood pressure 122/64, pulse 84, temperature 97.7 F (36.5 C), temperature source Temporal, resp. rate 12, height 5\' 6"  (1.676 m), weight 144 lb 6.4 oz (65.499 kg).  Physical Exam Physical Exam  Vitals reviewed. Constitutional: She is oriented to person, place, and time. She appears well-developed and well-nourished. No distress.  HENT:  Head: Normocephalic and atraumatic.  Right Ear: External ear normal.  Left Ear: External ear normal.  Eyes: Conjunctivae normal are normal. No scleral icterus.  Neck: Normal range of motion. Neck supple. No tracheal deviation present. No thyromegaly present.  Cardiovascular: Normal rate, regular rhythm and normal heart sounds.   Pulmonary/Chest: Effort normal and breath sounds normal. No respiratory distress. She has no wheezes. She has no  rales.  Abdominal: Soft. Bowel sounds are normal. She exhibits no distension. There is no tenderness. There is no rebound and no guarding.  Musculoskeletal: She exhibits no edema.  Neurological: She is alert and oriented to person, place, and time. She exhibits normal muscle tone.  Skin: Skin is warm and dry. No rash noted. She is not diaphoretic. No erythema. No pallor.  Psychiatric: She has a normal mood and affect. Her behavior is normal. Judgment and thought content normal.    Data Reviewed Dr Ardell Isaacs note from mid October 2013 ERCP with sphincterotomy report - diffuse CBD dilation, mild IHD, no stones; ?ampullary stenosis outpt LFTS from 10/9; 10/16/ 10/23 - most recent LFTs 10/23 show tbili 1.1 (2.9high); ast 145 (1032); alt 407 (1176); AP 172 (221)  COMPLETE ABDOMINAL ULTRASOUND 05/10/2012 Comparison: 09/05/2017 abdominal pelvic CT.  Findings:  Gallbladder: Contracted. Stones within. No wall thickening or  pericholecystic fluid. There is tenderness with gallbladder  palpation per technologist notes.   Common bile duct: Dilated, at 12 mm maximally. Upper normal in  this age group 6 mm. Increased from approximately 8 mm on  09/06/2007.   Liver: Within normal limits. No intrahepatic biliary ductal  dilatation.  IVC: Poorly visualized due to overlying bowel gas.  Pancreas: Poorly visualized due to overlying bowel gas.  Spleen: Normal in size and echogenicity.  Right Kidney: 12.1 cm. No hydronephrosis.  Left Kidney: 11.4 cm. No hydronephrosis.  Abdominal aorta: Nonaneurysmal without ascites.   IMPRESSION:  1. Contracted gallbladder with stones. Tenderness with  gallbladder palpation, without pericholecystic fluid or definite  wall thickening. Findings could represent chronic cholecystitis  (presuming the patient was n.p.o.). Given tenderness, a component  of acute cholecystitis cannot be entirely excluded, but is felt  unlikely.  2.  Common duct dilatation. No visualized  obstructive stone or  mass. Further evaluation with MRCP or contrast enhanced CT should  be considered to exclude obstructive stone or mass (MRCP of  increased sensitivity relative to CT for choledocholithiasis).  CT ABDOMEN AND PELVIS WITH CONTRAST  Technique: Multidetector CT imaging of the abdomen and pelvis was  performed following the standard protocol during bolus  administration of intravenous contrast.  Contrast: OMNIPAQUE IOHEXOL 300 MG/ML SOLN  Comparison: CT abdomen 09/05/2017, ultrasound 05/15/2012   Findings: Lung bases are clear. No pericardial fluid.  Subglandular breast implants are noted.  No focal hepatic lesion. Mild intrahepatic biliary dilatation.  The common hepatic duct and common bile duct are uniformly dilated.  The common bile duct measures 9 mm through the pancreatic head.  There is no obstructing lesion evident in the pancreatic head.  Pancreatic duct is normal caliber. There are multiple gallstones  within a contracted gallbladder. No pericholecystic fluid. The  pancreas body and tail appear normal.  The spleen, adrenal glands, and kidneys are normal.  The stomach, small bowel, cecum are normal. There is mild diffuse  thickening of the sigmoid colon which may simply represent  nondistension.  Abdominal aorta is normal caliber. No retroperitoneal or  periportal lymphadenopathy. No free fluid in the pelvis. The  bladder and uterus are normal. T No pelvic lymphadenopathy. Review  of bone windows demonstrates no aggressive osseous lesions.   Impression:  1. Uniform dilatation of the common hepatic duct and common bile  duct without obstructing lesion identified. Consider MRCP or ERCP  for further evaluation.  2. No evidence pancreatic head mass. Cannot exclude an ampullary  lesion.  3. Multiple cholesterol gallstones within the collapsed  gallbladder.   Assessment    Symptomatic cholelithiasis Dilated CBD/IHD s/p ERCP with sphinctertomy      Plan    It is hard to know whether or not her elevated LFTs were drug induced or if it was a true biliary etiology. Nonetheless the patient has multiple gallstones and a contracted gallbladder and is having intermittent right upper quadrant pain which is suggestive of gallbladder disease  We discussed gallbladder disease. The patient was given Agricultural engineer. We discussed non-operative and operative management. We discussed the signs & symptoms of acute cholecystitis  I discussed laparoscopic cholecystectomy with IOC in detail.  The patient was given educational material as well as diagrams detailing the procedure.  We discussed the risks and benefits of a laparoscopic cholecystectomy including, but not limited to bleeding, infection, injury to surrounding structures such as the intestine or liver, bile leak, retained gallstones, need to convert to an open procedure, prolonged diarrhea, blood clots such as  DVT, common bile duct injury, anesthesia risks, and possible need for additional procedures.  We discussed the typical post-operative recovery course. I explained that the likelihood of improvement of their symptoms is good.  The patient has elected to proceed to the operating room for cholecystectomy. We will schedule her for surgery  Mary Sella. Andrey Campanile, MD, FACS General, Bariatric, & Minimally Invasive Surgery [facility], [place]         [facility] M 05/29/2012, 4:15 PM

## 2012-05-29 NOTE — Patient Instructions (Signed)
Laparoscopic Cholecystectomy Laparoscopic cholecystectomy is surgery to remove the gallbladder. The gallbladder is located slightly to the right of center in the abdomen, behind the liver. It is a concentrating and storage sac for the bile produced in the liver. Bile aids in the digestion and absorption of fats. Gallbladder disease (cholecystitis) is an inflammation of your gallbladder. This condition is usually caused by a buildup of gallstones (cholelithiasis) in your gallbladder. Gallstones can block the flow of bile, resulting in inflammation and pain. In severe cases, emergency surgery may be required. When emergency surgery is not required, you will have time to prepare for the procedure. Laparoscopic surgery is an alternative to open surgery. Laparoscopic surgery usually has a shorter recovery time. Your common bile duct may also need to be examined and explored. Your caregiver will discuss this with you if he or she feels this should be done. If stones are found in the common bile duct, they may be removed. LET YOUR CAREGIVER KNOW ABOUT:  Allergies to food or medicine.  Medicines taken, including vitamins, herbs, eyedrops, over-the-counter medicines, and creams.  Use of steroids (by mouth or creams).  Previous problems with anesthetics or numbing medicines.  History of bleeding problems or blood clots.  Previous surgery.  Other health problems, including diabetes and kidney problems.  Possibility of pregnancy, if this applies. RISKS AND COMPLICATIONS All surgery is associated with risks. Some problems that may occur following this procedure include:  Infection.  Damage to the common bile duct, nerves, arteries, veins, or other internal organs such as the stomach or intestines.  Bleeding.  A stone may remain in the common bile duct. BEFORE THE PROCEDURE  Do not take aspirin for 3 days prior to surgery or blood thinners for 1 week prior to surgery.  Do not eat or drink  anything after midnight the night before surgery.  Let your caregiver know if you develop a cold or other infectious problem prior to surgery.  You should be present 60 minutes before the procedure or as directed. PROCEDURE  You will be given medicine that makes you sleep (general anesthetic). When you are asleep, your surgeon will make several small cuts (incisions) in your abdomen. One of these incisions is used to insert a small, lighted scope (laparoscope) into the abdomen. The laparoscope helps the surgeon see into your abdomen. Carbon dioxide gas will be pumped into your abdomen. The gas allows more room for the surgeon to perform your surgery. Other operating instruments are inserted through the other incisions. Laparoscopic procedures may not be appropriate when:  There is major scarring from previous surgery.  The gallbladder is extremely inflamed.  There are bleeding disorders or unexpected cirrhosis of the liver.  A pregnancy is near term.  Other conditions make the laparoscopic procedure impossible. If your surgeon feels it is not safe to continue with a laparoscopic procedure, he or she will perform an open abdominal procedure. In this case, the surgeon will make an incision to open the abdomen. This gives the surgeon a larger view and field to work within. This may allow the surgeon to perform procedures that sometimes cannot be performed with a laparoscope alone. Open surgery has a longer recovery time. AFTER THE PROCEDURE  You will be taken to the recovery area where a nurse will watch and check your progress.  You may be allowed to go home the same day.  Do not resume physical activities until directed by your caregiver.  You may resume a normal diet and   activities as directed. Document Released: 07/11/2005 Document Revised: 10/03/2011 Document Reviewed: 12/24/2010 [employer] Patient Information 2013 [employer].  

## 2012-06-04 ENCOUNTER — Encounter (HOSPITAL_COMMUNITY): Payer: Self-pay

## 2012-06-06 ENCOUNTER — Encounter (HOSPITAL_COMMUNITY): Payer: Self-pay

## 2012-06-06 ENCOUNTER — Encounter (HOSPITAL_COMMUNITY)
Admission: RE | Admit: 2012-06-06 | Discharge: 2012-06-06 | Disposition: A | Discharge status: Home / Self Care | Payer: 59 | Source: Ambulatory Visit | Attending: General Surgery | Admitting: General Surgery

## 2012-06-06 DIAGNOSIS — N3010 Interstitial cystitis (chronic) without hematuria: Secondary | ICD-10-CM

## 2012-06-06 HISTORY — DX: Interstitial cystitis (chronic) without hematuria: N30.10

## 2012-06-06 HISTORY — DX: Raynaud's syndrome without gangrene: I73.00

## 2012-06-06 HISTORY — DX: Cervical disc disorder, unspecified, unspecified cervical region: M50.90

## 2012-06-06 LAB — CBC WITH DIFFERENTIAL/PLATELET
Eosinophils Relative: 2 % (ref 0–5)
HCT: 30.5 % — ABNORMAL LOW (ref 36.0–46.0)
Hemoglobin: 9.8 g/dL — ABNORMAL LOW (ref 12.0–15.0)
Lymphocytes Relative: 24 % (ref 12–46)
MCHC: 32.1 g/dL (ref 30.0–36.0)
MCV: 92.7 fL (ref 78.0–100.0)
Monocytes Absolute: 0.4 10*3/uL (ref 0.1–1.0)
Monocytes Relative: 5 % (ref 3–12)
Neutro Abs: 5.3 10*3/uL (ref 1.7–7.7)
RDW: 16.7 % — ABNORMAL HIGH (ref 11.5–15.5)
WBC: 7.9 10*3/uL (ref 4.0–10.5)

## 2012-06-06 LAB — COMPREHENSIVE METABOLIC PANEL
BUN: 8 mg/dL (ref 6–23)
CO2: 30 mEq/L (ref 19–32)
Calcium: 9.5 mg/dL (ref 8.4–10.5)
Chloride: 104 mEq/L (ref 96–112)
Creatinine, Ser: 0.61 mg/dL (ref 0.50–1.10)
GFR calc Af Amer: >90 mL/min (ref >90)
GFR calc non Af Amer: >90 mL/min (ref >90)
Total Bilirubin: 0.5 mg/dL (ref 0.3–1.2)

## 2012-06-06 LAB — SURGICAL PCR SCREEN: Staphylococcus aureus: NEGATIVE

## 2012-06-06 NOTE — Pre-Procedure Instructions (Signed)
06-06-12 Note to in basket Dr. Barton Fanny 9.8, and labs viewable in Epic.W. Kennon Portela

## 2012-06-06 NOTE — Progress Notes (Signed)
Labs viewable in Epic-note Hgb-9.8  W. Shamila Lerch,RN

## 2012-06-06 NOTE — Patient Instructions (Addendum)
20 KARMELLA BOUVIER  06/06/2012   Your procedure is scheduled on: 11-18-  -2013  Report to [facility] at     0515   AM ..  Call this number if you have problems the morning of surgery: [phone]  Or Presurgical Testing [phone]([facility])   Remember:   Do not eat food:After Midnight.    Take these medicines the morning of surgery with A SIP OF WATER: Baclofen. Avinza. Zofran. Protonix. Xanax. Norvasc. Morphine. Lyrica. Lamictal   Do not wear jewelry, make-up or nail polish.  Do not wear lotions, powders, or perfumes. You may wear deodorant.  Do not shave 48 hours prior to surgery.(face and neck okay, no shaving of legs)  Do not bring valuables to the hospital.  Contacts, dentures or bridgework may not be worn into surgery.  Leave suitcase in the car. After surgery it may be brought to your room.  For patients admitted to the hospital, checkout time is 11:00 AM the day of discharge.   Patients discharged the day of surgery will not be allowed to drive home. Must have responsible person with you x 24 hours once discharged.  Name and phone number of your driver: Onalee Hua ,spouse 161- [phone] cell  Special Instructions: CHG Shower Use Special Wash:see special instruction sheet.(avoid face and genitals)   Please read over the following fact sheets that you were given: MRSA Information.

## 2012-06-10 ENCOUNTER — Other Ambulatory Visit: Payer: Self-pay | Admitting: Internal Medicine

## 2012-06-11 ENCOUNTER — Ambulatory Visit (HOSPITAL_COMMUNITY): Payer: 59

## 2012-06-11 ENCOUNTER — Ambulatory Visit (HOSPITAL_COMMUNITY): Payer: 59 | Admitting: Anesthesiology

## 2012-06-11 ENCOUNTER — Encounter (HOSPITAL_COMMUNITY): Payer: Self-pay | Admitting: Anesthesiology

## 2012-06-11 ENCOUNTER — Observation Stay (HOSPITAL_COMMUNITY)
Admission: RE | Admit: 2012-06-11 | Discharge: 2012-06-12 | Disposition: A | Discharge status: Home / Self Care | Payer: 59 | Source: Ambulatory Visit | Attending: General Surgery | Admitting: General Surgery

## 2012-06-11 ENCOUNTER — Encounter (HOSPITAL_COMMUNITY)
Admission: RE | Disposition: A | Discharge status: Home / Self Care | Payer: Self-pay | Source: Ambulatory Visit | Attending: General Surgery

## 2012-06-11 ENCOUNTER — Encounter (HOSPITAL_COMMUNITY): Payer: Self-pay | Admitting: *Deleted

## 2012-06-11 DIAGNOSIS — K8010 Calculus of gallbladder with chronic cholecystitis without obstruction: Principal | ICD-10-CM | POA: Insufficient documentation

## 2012-06-11 DIAGNOSIS — Z79899 Other long term (current) drug therapy: Secondary | ICD-10-CM | POA: Insufficient documentation

## 2012-06-11 DIAGNOSIS — F988 Other specified behavioral and emotional disorders with onset usually occurring in childhood and adolescence: Secondary | ICD-10-CM

## 2012-06-11 DIAGNOSIS — IMO0001 Reserved for inherently not codable concepts without codable children: Secondary | ICD-10-CM | POA: Insufficient documentation

## 2012-06-11 DIAGNOSIS — N3010 Interstitial cystitis (chronic) without hematuria: Secondary | ICD-10-CM | POA: Diagnosis present

## 2012-06-11 DIAGNOSIS — M069 Rheumatoid arthritis, unspecified: Secondary | ICD-10-CM | POA: Insufficient documentation

## 2012-06-11 DIAGNOSIS — D62 Acute posthemorrhagic anemia: Secondary | ICD-10-CM | POA: Diagnosis present

## 2012-06-11 DIAGNOSIS — R5381 Other malaise: Secondary | ICD-10-CM | POA: Diagnosis present

## 2012-06-11 DIAGNOSIS — M797 Fibromyalgia: Secondary | ICD-10-CM | POA: Diagnosis present

## 2012-06-11 DIAGNOSIS — K219 Gastro-esophageal reflux disease without esophagitis: Secondary | ICD-10-CM | POA: Insufficient documentation

## 2012-06-11 DIAGNOSIS — Z01812 Encounter for preprocedural laboratory examination: Secondary | ICD-10-CM | POA: Insufficient documentation

## 2012-06-11 HISTORY — PX: CHOLECYSTECTOMY: SHX55

## 2012-06-11 SURGERY — LAPAROSCOPIC CHOLECYSTECTOMY WITH INTRAOPERATIVE CHOLANGIOGRAM
Anesthesia: General | Site: Abdomen | Wound class: Contaminated

## 2012-06-11 MED ORDER — DEXTROSE 5 % IV SOLN
2.0000 g | INTRAVENOUS | Status: AC
  Administered 2012-06-11: 2 g via INTRAVENOUS
  Filled 2012-06-11: qty 2

## 2012-06-11 MED ORDER — BUPIVACAINE-EPINEPHRINE 0.25% -1:200000 IJ SOLN
INTRAMUSCULAR | Status: AC
Start: 2012-06-11 — End: 2012-06-11
  Filled 2012-06-11: qty 1

## 2012-06-11 MED ORDER — LACTATED RINGERS IV SOLN: INTRAVENOUS | Status: DC

## 2012-06-11 MED ORDER — SODIUM CHLORIDE 0.9 % IR SOLN
Status: DC | PRN
  Administered 2012-06-11: 1000 mL

## 2012-06-11 MED ORDER — IOHEXOL 300 MG/ML  SOLN
INTRAMUSCULAR | Status: AC
  Filled 2012-06-11: qty 1

## 2012-06-11 MED ORDER — ENOXAPARIN SODIUM 40 MG/0.4ML ~~LOC~~ SOLN
40.0000 mg | SUBCUTANEOUS | Status: DC
  Administered 2012-06-12: 40 mg via SUBCUTANEOUS
  Filled 2012-06-11 (×2): qty 0.4

## 2012-06-11 MED ORDER — LACTATED RINGERS IR SOLN
Status: DC | PRN
  Administered 2012-06-11: 1000 mL

## 2012-06-11 MED ORDER — DIATRIZOATE MEGLUMINE 30 % UR SOLN
URETHRAL | Status: DC | PRN
  Administered 2012-06-11: 23 mL

## 2012-06-11 MED ORDER — ARIPIPRAZOLE 10 MG PO TABS
10.0000 mg | ORAL_TABLET | Freq: Every day | ORAL | Status: DC
  Administered 2012-06-11: 10 mg via ORAL
  Filled 2012-06-11 (×2): qty 1

## 2012-06-11 MED ORDER — PROMETHAZINE HCL 25 MG/ML IJ SOLN: 6.2500 mg | INTRAMUSCULAR | Status: DC | PRN

## 2012-06-11 MED ORDER — GLYCOPYRROLATE 0.2 MG/ML IJ SOLN
INTRAMUSCULAR | Status: DC | PRN
  Administered 2012-06-11: 0.4 mg via INTRAVENOUS

## 2012-06-11 MED ORDER — LIDOCAINE HCL (CARDIAC) 20 MG/ML IV SOLN
INTRAVENOUS | Status: DC | PRN
  Administered 2012-06-11: 20 mg via INTRAVENOUS

## 2012-06-11 MED ORDER — OXYCODONE-ACETAMINOPHEN 5-325 MG PO TABS
1.0000 | ORAL_TABLET | ORAL | Status: DC | PRN
Start: 2012-06-11 — End: 2012-06-12
  Administered 2012-06-12: 1 via ORAL
  Administered 2012-06-12: 2 via ORAL
  Filled 2012-06-11 (×3): qty 1

## 2012-06-11 MED ORDER — SUCCINYLCHOLINE CHLORIDE 20 MG/ML IJ SOLN
INTRAMUSCULAR | Status: DC | PRN
  Administered 2012-06-11: 80 mg via INTRAVENOUS

## 2012-06-11 MED ORDER — ACETAMINOPHEN 10 MG/ML IV SOLN
INTRAVENOUS | Status: AC
  Filled 2012-06-11: qty 100

## 2012-06-11 MED ORDER — ONDANSETRON HCL 4 MG/2ML IJ SOLN
INTRAMUSCULAR | Status: DC | PRN
  Administered 2012-06-11 (×2): 2 mg via INTRAVENOUS

## 2012-06-11 MED ORDER — PENTOSAN POLYSULFATE SODIUM 100 MG PO CAPS
100.0000 mg | ORAL_CAPSULE | Freq: Two times a day (BID) | ORAL | Status: DC
  Administered 2012-06-11 – 2012-06-12 (×3): 100 mg via ORAL
  Filled 2012-06-11 (×4): qty 1

## 2012-06-11 MED ORDER — BACLOFEN 10 MG PO TABS
10.0000 mg | ORAL_TABLET | Freq: Three times a day (TID) | ORAL | Status: DC
  Administered 2012-06-11 – 2012-06-12 (×3): 10 mg via ORAL
  Filled 2012-06-11 (×5): qty 1

## 2012-06-11 MED ORDER — HYDROMORPHONE HCL PF 1 MG/ML IJ SOLN: 0.2500 mg | INTRAMUSCULAR | Status: DC | PRN

## 2012-06-11 MED ORDER — NEOSTIGMINE METHYLSULFATE 1 MG/ML IJ SOLN
INTRAMUSCULAR | Status: DC | PRN
  Administered 2012-06-11: 3 mg via INTRAVENOUS

## 2012-06-11 MED ORDER — CISATRACURIUM BESYLATE (PF) 10 MG/5ML IV SOLN
INTRAVENOUS | Status: DC | PRN
  Administered 2012-06-11: 6 mg via INTRAVENOUS

## 2012-06-11 MED ORDER — EPHEDRINE SULFATE 50 MG/ML IJ SOLN
INTRAMUSCULAR | Status: DC | PRN
  Administered 2012-06-11: 10 mg via INTRAVENOUS

## 2012-06-11 MED ORDER — AMLODIPINE BESYLATE 5 MG PO TABS
5.0000 mg | ORAL_TABLET | Freq: Every day | ORAL | Status: DC
Start: 2012-06-12 — End: 2012-06-12
  Administered 2012-06-12: 5 mg via ORAL
  Filled 2012-06-11: qty 1

## 2012-06-11 MED ORDER — SCOPOLAMINE 1 MG/3DAYS TD PT72
MEDICATED_PATCH | TRANSDERMAL | Status: DC | PRN
  Administered 2012-06-11: 1 via TRANSDERMAL

## 2012-06-11 MED ORDER — FENTANYL CITRATE 0.05 MG/ML IJ SOLN
INTRAMUSCULAR | Status: DC | PRN
  Administered 2012-06-11: 25 ug via INTRAVENOUS
  Administered 2012-06-11: 50 ug via INTRAVENOUS
  Administered 2012-06-11: 25 ug via INTRAVENOUS
  Administered 2012-06-11 (×5): 50 ug via INTRAVENOUS

## 2012-06-11 MED ORDER — LAMOTRIGINE 150 MG PO TABS
150.0000 mg | ORAL_TABLET | Freq: Two times a day (BID) | ORAL | Status: DC
  Administered 2012-06-11 – 2012-06-12 (×2): 150 mg via ORAL
  Filled 2012-06-11 (×3): qty 1

## 2012-06-11 MED ORDER — ACETAMINOPHEN 10 MG/ML IV SOLN: 1000.0000 mg | Freq: Once | INTRAVENOUS | Status: DC | PRN

## 2012-06-11 MED ORDER — PREGABALIN 75 MG PO CAPS
150.0000 mg | ORAL_CAPSULE | Freq: Three times a day (TID) | ORAL | Status: DC
  Administered 2012-06-11 – 2012-06-12 (×3): 150 mg via ORAL
  Filled 2012-06-11 (×3): qty 2

## 2012-06-11 MED ORDER — ALPRAZOLAM 1 MG PO TABS: 1.0000 mg | ORAL_TABLET | Freq: Three times a day (TID) | ORAL | Status: DC | PRN

## 2012-06-11 MED ORDER — CEFOXITIN SODIUM-DEXTROSE 1-4 GM-% IV SOLR (PREMIX)
INTRAVENOUS | Status: AC
  Filled 2012-06-11: qty 100

## 2012-06-11 MED ORDER — MORPHINE SULFATE ER 15 MG PO TBCR
15.0000 mg | EXTENDED_RELEASE_TABLET | Freq: Two times a day (BID) | ORAL | Status: DC
  Administered 2012-06-11 – 2012-06-12 (×2): 15 mg via ORAL
  Filled 2012-06-11 (×2): qty 1

## 2012-06-11 MED ORDER — PANTOPRAZOLE SODIUM 40 MG IV SOLR
40.0000 mg | Freq: Every day | INTRAVENOUS | Status: DC
  Administered 2012-06-11: 40 mg via INTRAVENOUS
  Filled 2012-06-11 (×2): qty 40

## 2012-06-11 MED ORDER — ONDANSETRON HCL 4 MG PO TABS: 4.0000 mg | ORAL_TABLET | Freq: Four times a day (QID) | ORAL | Status: DC | PRN

## 2012-06-11 MED ORDER — GLUCAGON HCL (RDNA) 1 MG IJ SOLR
INTRAMUSCULAR | Status: DC | PRN
  Administered 2012-06-11: 1 mg via INTRAVENOUS

## 2012-06-11 MED ORDER — ONDANSETRON HCL 4 MG/2ML IJ SOLN: 4.0000 mg | Freq: Four times a day (QID) | INTRAMUSCULAR | Status: DC | PRN

## 2012-06-11 MED ORDER — BUPIVACAINE-EPINEPHRINE 0.25% -1:200000 IJ SOLN
INTRAMUSCULAR | Status: DC | PRN
  Administered 2012-06-11: 30 mL

## 2012-06-11 MED ORDER — KCL IN DEXTROSE-NACL 20-5-0.45 MEQ/L-%-% IV SOLN
INTRAVENOUS | Status: DC
  Administered 2012-06-11 – 2012-06-12 (×2): via INTRAVENOUS
  Filled 2012-06-11 (×3): qty 1000

## 2012-06-11 MED ORDER — AMPHETAMINE-DEXTROAMPHET ER 10 MG PO CP24
30.0000 mg | ORAL_CAPSULE | Freq: Every day | ORAL | Status: DC
  Administered 2012-06-12: 10 mg via ORAL
  Filled 2012-06-11: qty 1

## 2012-06-11 MED ORDER — LACTATED RINGERS IV SOLN
INTRAVENOUS | Status: DC | PRN
  Administered 2012-06-11: 07:00:00 via INTRAVENOUS

## 2012-06-11 MED ORDER — ACETAMINOPHEN 10 MG/ML IV SOLN
INTRAVENOUS | Status: DC | PRN
  Administered 2012-06-11: 1000 mg via INTRAVENOUS

## 2012-06-11 MED ORDER — PROPOFOL 10 MG/ML IV EMUL
INTRAVENOUS | Status: DC | PRN
  Administered 2012-06-11: 150 mg via INTRAVENOUS

## 2012-06-11 MED ORDER — SCOPOLAMINE 1 MG/3DAYS TD PT72
MEDICATED_PATCH | TRANSDERMAL | Status: AC
  Filled 2012-06-11: qty 1

## 2012-06-11 MED ORDER — MORPHINE SULFATE 2 MG/ML IJ SOLN
1.0000 mg | INTRAMUSCULAR | Status: DC | PRN
  Administered 2012-06-11: 3 mg via INTRAVENOUS
  Administered 2012-06-11: 2 mg via INTRAVENOUS
  Administered 2012-06-11: 3 mg via INTRAVENOUS
  Filled 2012-06-11: qty 2
  Filled 2012-06-11: qty 1
  Filled 2012-06-11: qty 2
  Filled 2012-06-11: qty 1

## 2012-06-11 SURGICAL SUPPLY — 49 items
APL SKNCLS STERI-STRIP NONHPOA (GAUZE/BANDAGES/DRESSINGS)
APPLIER CLIP 5 13 M/L LIGAMAX5 (MISCELLANEOUS)
APPLIER CLIP ROT 10 11.4 M/L (STAPLE) ×2
APR CLP MED LRG 11.4X10 (STAPLE) ×1
APR CLP MED LRG 5 ANG JAW (MISCELLANEOUS)
BAG SPEC RTRVL LRG 6X4 10 (ENDOMECHANICALS) ×1
BANDAGE ADHESIVE 1X3 (GAUZE/BANDAGES/DRESSINGS) ×6 IMPLANT
BENZOIN TINCTURE PRP APPL 2/3 (GAUZE/BANDAGES/DRESSINGS) ×1 IMPLANT
CANISTER SUCTION 2500CC (MISCELLANEOUS) ×2 IMPLANT
CHLORAPREP W/TINT 26ML (MISCELLANEOUS) ×2 IMPLANT
CLIP APPLIE 5 13 M/L LIGAMAX5 (MISCELLANEOUS) IMPLANT
CLIP APPLIE ROT 10 11.4 M/L (STAPLE) IMPLANT
CLOTH BEACON ORANGE TIMEOUT ST (SAFETY) ×2 IMPLANT
COVER MAYO STAND STRL (DRAPES) ×1 IMPLANT
COVER SURGICAL LIGHT HANDLE (MISCELLANEOUS) ×1 IMPLANT
DECANTER SPIKE VIAL GLASS SM (MISCELLANEOUS) ×2 IMPLANT
DRAIN CHANNEL RND F F (WOUND CARE) ×1 IMPLANT
DRAPE C-ARM 42X72 X-RAY (DRAPES) ×1 IMPLANT
DRAPE LAPAROSCOPIC ABDOMINAL (DRAPES) ×2 IMPLANT
DRAPE UTILITY XL STRL (DRAPES) ×2 IMPLANT
DRSG TEGADERM 2-3/8X2-3/4 SM (GAUZE/BANDAGES/DRESSINGS) ×2 IMPLANT
ELECT REM PT RETURN 9FT ADLT (ELECTROSURGICAL) ×2
ELECTRODE REM PT RTRN 9FT ADLT (ELECTROSURGICAL) ×1 IMPLANT
ENDOLOOP SUT PDS II  0 18 (SUTURE) ×5
ENDOLOOP SUT PDS II 0 18 (SUTURE) IMPLANT
EVACUATOR 1/8 PVC DRAIN (DRAIN) ×1 IMPLANT
GLOVE BIO SURGEON STRL SZ7.5 (GLOVE) ×3 IMPLANT
GLOVE BIOGEL M STRL SZ7.5 (GLOVE) IMPLANT
GLOVE BIOGEL PI IND STRL 7.0 (GLOVE) ×1 IMPLANT
GLOVE BIOGEL PI INDICATOR 7.0 (GLOVE) ×1
GLOVE INDICATOR 8.0 STRL GRN (GLOVE) ×2 IMPLANT
GOWN STRL NON-REIN LRG LVL3 (GOWN DISPOSABLE) ×2 IMPLANT
GOWN STRL REIN XL XLG (GOWN DISPOSABLE) ×4 IMPLANT
KIT BASIN OR (CUSTOM PROCEDURE TRAY) ×2 IMPLANT
NS IRRIG 1000ML POUR BTL (IV SOLUTION) ×2 IMPLANT
POUCH SPECIMEN RETRIEVAL 10MM (ENDOMECHANICALS) ×2 IMPLANT
SET CHOLANGIOGRAPH MIX (MISCELLANEOUS) ×1 IMPLANT
SET IRRIG TUBING LAPAROSCOPIC (IRRIGATION / IRRIGATOR) ×2 IMPLANT
SOLUTION ANTI FOG 6CC (MISCELLANEOUS) ×2 IMPLANT
STRIP CLOSURE SKIN 1/2X4 (GAUZE/BANDAGES/DRESSINGS) ×2 IMPLANT
SUT ETHILON 2 0 PS N (SUTURE) ×1 IMPLANT
SUT MNCRL AB 4-0 PS2 18 (SUTURE) ×2 IMPLANT
SUT VICRYL 0 UR6 27IN ABS (SUTURE) ×1 IMPLANT
TOWEL OR 17X26 10 PK STRL BLUE (TOWEL DISPOSABLE) ×2 IMPLANT
TRAY LAP CHOLE (CUSTOM PROCEDURE TRAY) ×2 IMPLANT
TROCAR BLADELESS OPT 5 75 (ENDOMECHANICALS) ×6 IMPLANT
TROCAR XCEL BLUNT TIP 100MML (ENDOMECHANICALS) ×2 IMPLANT
TROCAR XCEL NON-BLD 11X100MML (ENDOMECHANICALS) ×1 IMPLANT
TUBING INSUFFLATION 10FT LAP (TUBING) ×2 IMPLANT

## 2012-06-11 NOTE — Interval H&P Note (Signed)
History and Physical Interval Note:  06/11/2012 7:16 AM  Rachel Duran  has presented today for surgery, with the diagnosis of symptomatic cholelithiasis  The various methods of treatment have been discussed with the patient and family. After consideration of risks, benefits and other options for treatment, the patient has consented to  Procedure(s) (LRB) with comments: LAPAROSCOPIC CHOLECYSTECTOMY WITH INTRAOPERATIVE CHOLANGIOGRAM (N/A) as a surgical intervention .  The patient's history has been reviewed, patient examined, no change in status, stable for surgery.  I have reviewed the patient's chart and labs.  Questions were answered to the patient's satisfaction.    Mary Sella. Andrey Campanile, MD, FACS General, Bariatric, & Minimally Invasive Surgery [facility], [place]   [facility] M

## 2012-06-11 NOTE — H&P (View-Only) (Signed)
Patient ID: Rachel Duran, female   DOB: 05/19/1956, 56 y.o.   MRN: [id number]  Chief Complaint  Patient presents with  . Pre-op Exam    eval GB    HPI Rachel Duran is a 56 y.o. female.   HPI 56 YO WF referred by Dr Stark for evaluation of gallstones. The patient states that she has severe rheumatoid arthritis as well as fibromyalgia and was in the process of getting placed on a new type of treatment for these medical problems that required her to have a TB test  Several months ago. 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 4 weeks after starting INH, she reports that she started having fatigue, weakness, headaches, sweats, and right upper quadrant abdominal pain. She states that her liver function tests were elevated. Initially it was thought that her elevated LFTs were due to the INH. She underwent abdominal ultrasound which showed gallstones as well as dilated bile ducts. This prompted a CT scan which confirmed the findings. During this time she was still having intermittent right upper quadrant pain radiating to her back. The pain was essentially always there however it was manageable and more of just an ache. However at times it would increase in intensity for 30-40 minutes at a time. It is often associated with nausea as well as some bloating. She ended up seeing gastroenterology and underwent an ERCP on October 29. She had a sphincterotomy. The bile ducts were dilated. There is no evidence of gallstones in the bile duct. There is a question of ampullary stenosis. She states that she has recovered uneventfully from her ERCP however she continues to have intermittent right upper quadrant pain. Her LFTs are trending back down toward normal. She states that her appetite is still abnormal. She does have a bowel movement about every day; however she is on MiraLAX. She does take a long-acting narcotic for her fibromyalgia. She states she did have yellowing of the eyes  when her LFTs were really elevated in early October. She denies any acholic stools. She denies any reflux. She denies any NSAID use. Past Medical History  Diagnosis Date  . Disorders of porphyrin metabolism   . Irritable bowel syndrome   . Fibromyalgia   . Arthritis   . GERD (gastroesophageal reflux disease)   . Depression   . Allergy   . Chronic pain   . Cholecystitis   . Internal hemorrhoids   . PONV (postoperative nausea and vomiting)     Past Surgical History  Procedure Date  . Cervical fusion   . Cervical lamination   . Cystoscopy   . Hernia repair   . Nasal sinus surgery   . Gyn surgery   . Hysteroscopy with ablation   . Breast enhancement surgery 2010  . Nasal sinus surgery     x5  . Ercp 05/22/2012    Procedure: ENDOSCOPIC RETROGRADE CHOLANGIOPANCREATOGRAPHY (ERCP);  Surgeon: Malcolm T Stark, MD,FACG;  Location: [facility];  Service: Endoscopy;  Laterality: N/A;  . Cesarean section     Family History  Problem Relation Age of Onset  . Heart disease Father   . Esophageal cancer Father   . Scleroderma Mother   . Cancer Mother     esophageal & stomach  . Cancer Paternal Grandfather     esophageal & stomach    Social History History  Substance Use Topics  . Smoking status: Never Smoker   . Smokeless tobacco: Never Used  .   Alcohol Use: No    Allergies  Allergen Reactions  . Codeine Phosphate Nausea And Vomiting    Severe n&v  . Cymbalta (Duloxetine Hcl)     Urinary retention  . Inh (Isoniazid)     Hepatitis  . Meperidine Hcl Rash    REACTION: red skin  . Sulfamethoxazole Rash    Current Outpatient Prescriptions  Medication Sig Dispense Refill  . acyclovir (ZOVIRAX) 800 MG tablet TAKE ONE-HALF (1/2) TABLET DAILY  90 tablet  2  . ALPRAZolam (XANAX) 1 MG tablet Take 1 mg by mouth 3 (three) times daily as needed.        . AMITIZA 24 MCG capsule TAKE ONE CAPSULE BY MOUTH ONCE DAILY WITH BREAKFAST  30 each  6  . amLODipine (NORVASC) 5 MG tablet Take  5 mg by mouth daily.        . amphetamine-dextroamphetamine (ADDERALL XR) 30 MG 24 hr capsule Take 1 capsule (30 mg total) by mouth every morning.  30 capsule  0  . ARIPiprazole (ABILIFY) 10 MG tablet Take 10 mg by mouth daily.        . aspirin 81 MG tablet Take 81 mg by mouth daily.        . baclofen (LIORESAL) 10 MG tablet TAKE 1 TABLET THREE TIMES A DAY  270 tablet  2  . Calcium Carbonate-Vitamin D (CALCIUM-VITAMIN D) 500-200 MG-UNIT per tablet Take 1 tablet by mouth 3 (three) times daily with meals.        . Cholecalciferol (D 400 PO) Take by mouth.        . cyanocobalamin (,VITAMIN B-12,) 1000 MCG/ML injection Inject 1,000 mcg into the muscle every 21 ( twenty-one) days.      . Eszopiclone (ESZOPICLONE) 3 MG TABS Take 1 tablet (3 mg total) by mouth at bedtime. Take immediately before bedtime  30 tablet  3  . etodolac (LODINE XL) 400 MG 24 hr tablet Take 400 mg by mouth daily.      . lamoTRIgine (LAMICTAL) 150 MG tablet Take 150 mg by mouth 2 (two) times daily.        . lidocaine (LIDODERM) 5 % Place 3 patches onto the skin daily. Remove & Discard patch within 12 hours or as directed by MD       . Manganese 50 MG TABS Take 1 tablet by mouth daily.      . morphine (AVINZA) 90 MG 24 hr capsule Take 1 capsule (90 mg total) by mouth daily.  30 capsule  0  . morphine (MSIR) 15 MG tablet Take 1 tablet by mouth as needed.      . ondansetron (ZOFRAN) 4 MG tablet Take 1 tablet (4 mg total) by mouth every 8 (eight) hours as needed.  30 tablet  6  . OVER THE COUNTER MEDICATION Take 1,000 mg by mouth 3 (three) times daily.      . pantoprazole (PROTONIX) 40 MG tablet Take 1 tablet (40 mg total) by mouth daily.  90 tablet  3  . pentosan polysulfate (ELMIRON) 100 MG capsule Take 100 mg by mouth. 2 by mouth twice a day        . pregabalin (LYRICA) 150 MG capsule Take 150 mg by mouth 3 (three) times daily.        . Triamcinolone Acetonide (NASACORT AQ NA) 2 sprays by Nasal route daily.        . VAGIFEM 10  MCG TABS Place 1 tablet vaginally 2 (two) times a week.      . [  DISCONTINUED] CVS SLOW RELEASE IRON 143 (45 FE) MG TBCR TAKE 1 TABLET EVERY DAY  30 tablet  0    Review of Systems Review of Systems  Constitutional: Positive for appetite change and fatigue. Negative for fever, activity change and unexpected weight change.  HENT: Negative for hearing loss, nosebleeds and neck pain.   Eyes: Negative for photophobia and visual disturbance.  Respiratory: Negative for chest tightness, shortness of breath and wheezing.   Cardiovascular: Negative for chest pain, palpitations and leg swelling.       Denies CP, SOB, DOE, PND, orthopnea  Gastrointestinal:       See hpi  Genitourinary: Negative for dysuria, hematuria and difficulty urinating.  Musculoskeletal:       Takes long acting narcotics for severe fibromyalgia; jt pain, RA  Neurological: Positive for weakness. Negative for tremors, seizures, syncope, facial asymmetry, speech difficulty, light-headedness and headaches.  Hematological: Negative for adenopathy. Does not bruise/bleed easily.  Psychiatric/Behavioral: Negative for behavioral problems and confusion.    Blood pressure 122/64, pulse 84, temperature 97.7 F (36.5 C), temperature source Temporal, resp. rate 12, height 5' 6" (1.676 m), weight 144 lb 6.4 oz (65.499 kg).  Physical Exam Physical Exam  Vitals reviewed. Constitutional: She is oriented to person, place, and time. She appears well-developed and well-nourished. No distress.  HENT:  Head: Normocephalic and atraumatic.  Right Ear: External ear normal.  Left Ear: External ear normal.  Eyes: Conjunctivae normal are normal. No scleral icterus.  Neck: Normal range of motion. Neck supple. No tracheal deviation present. No thyromegaly present.  Cardiovascular: Normal rate, regular rhythm and normal heart sounds.   Pulmonary/Chest: Effort normal and breath sounds normal. No respiratory distress. She has no wheezes. She has no  rales.  Abdominal: Soft. Bowel sounds are normal. She exhibits no distension. There is no tenderness. There is no rebound and no guarding.  Musculoskeletal: She exhibits no edema.  Neurological: She is alert and oriented to person, place, and time. She exhibits normal muscle tone.  Skin: Skin is warm and dry. No rash noted. She is not diaphoretic. No erythema. No pallor.  Psychiatric: She has a normal mood and affect. Her behavior is normal. Judgment and thought content normal.    Data Reviewed Dr Stark's note from mid October 2013 ERCP with sphincterotomy report - diffuse CBD dilation, mild IHD, no stones; ?ampullary stenosis outpt LFTS from 10/9; 10/16/ 10/23 - most recent LFTs 10/23 show tbili 1.1 (2.9high); ast 145 (1032); alt 407 (1176); AP 172 (221)  COMPLETE ABDOMINAL ULTRASOUND 05/10/2012 Comparison: 09/05/2017 abdominal pelvic CT.  Findings:  Gallbladder: Contracted. Stones within. No wall thickening or  pericholecystic fluid. There is tenderness with gallbladder  palpation per technologist notes.   Common bile duct: Dilated, at 12 mm maximally. Upper normal in  this age group 6 mm. Increased from approximately 8 mm on  09/06/2007.   Liver: Within normal limits. No intrahepatic biliary ductal  dilatation.  IVC: Poorly visualized due to overlying bowel gas.  Pancreas: Poorly visualized due to overlying bowel gas.  Spleen: Normal in size and echogenicity.  Right Kidney: 12.1 cm. No hydronephrosis.  Left Kidney: 11.4 cm. No hydronephrosis.  Abdominal aorta: Nonaneurysmal without ascites.   IMPRESSION:  1. Contracted gallbladder with stones. Tenderness with  gallbladder palpation, without pericholecystic fluid or definite  wall thickening. Findings could represent chronic cholecystitis  (presuming the patient was n.p.o.). Given tenderness, a component  of acute cholecystitis cannot be entirely excluded, but is felt  unlikely.  2.   Common duct dilatation. No visualized  obstructive stone or  mass. Further evaluation with MRCP or contrast enhanced CT should  be considered to exclude obstructive stone or mass (MRCP of  increased sensitivity relative to CT for choledocholithiasis).  CT ABDOMEN AND PELVIS WITH CONTRAST  Technique: Multidetector CT imaging of the abdomen and pelvis was  performed following the standard protocol during bolus  administration of intravenous contrast.  Contrast: 100mL OMNIPAQUE IOHEXOL 300 MG/ML SOLN  Comparison: CT abdomen 09/05/2017, ultrasound 05/15/2012   Findings: Lung bases are clear. No pericardial fluid.  Subglandular breast implants are noted.  No focal hepatic lesion. Mild intrahepatic biliary dilatation.  The common hepatic duct and common bile duct are uniformly dilated.  The common bile duct measures 9 mm through the pancreatic head.  There is no obstructing lesion evident in the pancreatic head.  Pancreatic duct is normal caliber. There are multiple gallstones  within a contracted gallbladder. No pericholecystic fluid. The  pancreas body and tail appear normal.  The spleen, adrenal glands, and kidneys are normal.  The stomach, small bowel, cecum are normal. There is mild diffuse  thickening of the sigmoid colon which may simply represent  nondistension.  Abdominal aorta is normal caliber. No retroperitoneal or  periportal lymphadenopathy. No free fluid in the pelvis. The  bladder and uterus are normal. T No pelvic lymphadenopathy. Review  of bone windows demonstrates no aggressive osseous lesions.   Impression:  1. Uniform dilatation of the common hepatic duct and common bile  duct without obstructing lesion identified. Consider MRCP or ERCP  for further evaluation.  2. No evidence pancreatic head mass. Cannot exclude an ampullary  lesion.  3. Multiple cholesterol gallstones within the collapsed  gallbladder.   Assessment    Symptomatic cholelithiasis Dilated CBD/IHD s/p ERCP with sphinctertomy      Plan    It is hard to know whether or not her elevated LFTs were drug induced or if it was a true biliary etiology. Nonetheless the patient has multiple gallstones and a contracted gallbladder and is having intermittent right upper quadrant pain which is suggestive of gallbladder disease  We discussed gallbladder disease. The patient was given educational material. We discussed non-operative and operative management. We discussed the signs & symptoms of acute cholecystitis  I discussed laparoscopic cholecystectomy with IOC in detail.  The patient was given educational material as well as diagrams detailing the procedure.  We discussed the risks and benefits of a laparoscopic cholecystectomy including, but not limited to bleeding, infection, injury to surrounding structures such as the intestine or liver, bile leak, retained gallstones, need to convert to an open procedure, prolonged diarrhea, blood clots such as  DVT, common bile duct injury, anesthesia risks, and possible need for additional procedures.  We discussed the typical post-operative recovery course. I explained that the likelihood of improvement of their symptoms is good.  The patient has elected to proceed to the operating room for cholecystectomy. We will schedule her for surgery  Frederik Standley M. Cherl Gorney, MD, FACS General, Bariatric, & Minimally Invasive Surgery [facility] [place]         Jaylianna Tatlock M 05/29/2012, 4:15 PM    

## 2012-06-11 NOTE — Anesthesia Preprocedure Evaluation (Addendum)
Anesthesia Evaluation  Patient identified by MRN, date of birth, ID band Patient awake    Reviewed: Allergy & Precautions, H&P , NPO status , Patient's Chart, lab work & pertinent test results  History of Anesthesia Complications (+) PONV  Airway Mallampati: II TM Distance: >3 FB Neck ROM: Full    Dental  (+) Dental Advisory Given   Pulmonary neg pulmonary ROS,  breath sounds clear to auscultation  Pulmonary exam normal       Cardiovascular - CAD and - Past MI negative cardio ROS  Rhythm:Regular Rate:Normal     Neuro/Psych PSYCHIATRIC DISORDERS Depression  Neuromuscular disease negative psych ROS   GI/Hepatic negative GI ROS, Neg liver ROS, GERD-  Medicated,Porphyria Cutanea Tarda IBS   Endo/Other  negative endocrine ROS  Renal/GU negative Renal ROS  negative genitourinary   Musculoskeletal  (+) Fibromyalgia -  Abdominal   Peds negative pediatric ROS (+)  Hematology negative hematology ROS (+) Porphyria    Anesthesia Other Findings   Reproductive/Obstetrics negative OB ROS                          Anesthesia Physical Anesthesia Plan  ASA: III  Anesthesia Plan: General   Post-op Pain Management:    Induction: Intravenous  Airway Management Planned: Oral ETT  Additional Equipment:   Intra-op Plan:   Post-operative Plan: Extubation in OR  Informed Consent: I have reviewed the patients History and Physical, chart, labs and discussed the procedure including the risks, benefits and alternatives for the proposed anesthesia with the patient or authorized representative who has indicated his/her understanding and acceptance.   Dental advisory given  Plan Discussed with: CRNA  Anesthesia Plan Comments: (No hx of porphyric crisis. Will try to avoid triggering agents, regardless.)       Anesthesia Quick Evaluation                                   Anesthesia Evaluation    Patient identified by MRN, date of birth, ID band Patient awake    Reviewed: Allergy & Precautions, H&P , NPO status , Patient's Chart, lab work & pertinent test results  History of Anesthesia Complications (+) PONV  Airway Mallampati: II TM Distance: >3 FB Neck ROM: Full    Dental  (+) Teeth Intact and Dental Advisory Given   Pulmonary neg pulmonary ROS,  breath sounds clear to auscultation  Pulmonary exam normal       Cardiovascular negative cardio ROS  Rhythm:Regular Rate:Normal     Neuro/Psych PSYCHIATRIC DISORDERS Depression  Neuromuscular disease negative psych ROS   GI/Hepatic negative GI ROS, Neg liver ROS, GERD-  Medicated,Porphyria Cutanea Tarda IBS   Endo/Other  negative endocrine ROS  Renal/GU negative Renal ROS  negative genitourinary   Musculoskeletal  (+) Fibromyalgia -  Abdominal   Peds negative pediatric ROS (+)  Hematology negative hematology ROS (+)   Anesthesia Other Findings   Reproductive/Obstetrics negative OB ROS                          Anesthesia Physical Anesthesia Plan  ASA: III  Anesthesia Plan: MAC   Post-op Pain Management:    Induction: Intravenous  Airway Management Planned: Nasal Cannula  Additional Equipment:   Intra-op Plan:   Post-operative Plan:   Informed Consent: I have reviewed the patients History and Physical, chart, labs and  discussed the procedure including the risks, benefits and alternatives for the proposed anesthesia with the patient or authorized representative who has indicated his/her understanding and acceptance.   Dental advisory given  Plan Discussed with: CRNA  Anesthesia Plan Comments:         Anesthesia Quick Evaluation

## 2012-06-11 NOTE — Progress Notes (Signed)
Patient opens eyes- MAE X4- bilateral hand grips stron- lifts head off of bed and is able to sustain it- Dr. Renold Don in- checked patient- oral airway removed without difficulty- patient suctioned by Dr. Renold Don and then extubated without difficulty and placed on Aerosol Mask - FIO2 AT 40 %

## 2012-06-11 NOTE — Brief Op Note (Signed)
06/11/2012  9:23 AM  PATIENT:  Rachel Duran  56 y.o. female  PRE-OPERATIVE DIAGNOSIS:  symptomatic cholelithiasis  POST-OPERATIVE DIAGNOSIS:  symptomatic cholelithiasis  PROCEDURE:  Procedure(s) (LRB) with comments: LAPAROSCOPIC CHOLECYSTECTOMY WITH INTRAOPERATIVE CHOLANGIOGRAM (N/A)  SURGEON:  Surgeon(s) and Role:    * Atilano Ina, MD,FACS - Primary  PHYSICIAN ASSISTANT:   ASSISTANTS: none   ANESTHESIA:   general  EBL:  Total I/O In: 1000 [I.V.:1000] Out: -   BLOOD ADMINISTERED:none  DRAINS: (34fr) Jackson-Pratt drain(s) with closed bulb suction in the RUQ   FINDINGS: dilated cystic/CBD/CHD/IHD. ?filling defect distal CBD - contrast emptied into duodenum.  LOCAL MEDICATIONS USED:  MARCAINE     SPECIMEN:  Source of Specimen:  gallbladder  DISPOSITION OF SPECIMEN:  PATHOLOGY  COUNTS:  YES  TOURNIQUET:  * No tourniquets in log *  DICTATION: .Other Dictation: Dictation Number [id number]  PLAN OF CARE: Admit for overnight observation  PATIENT DISPOSITION:  PACU - hemodynamically stable.   Mary Sella. Andrey Campanile, MD, FACS General, Bariatric, & Minimally Invasive Surgery [facility] [place]  Delay start of Pharmacological VTE agent (>24hrs) due to surgical blood loss or risk of bleeding: no

## 2012-06-11 NOTE — Progress Notes (Signed)
Reviewing the pt history before transfer from PACU, I noted the postive TB quantiferon test and questioned the need for a negative pressure room.  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  Per pt, will resume INH therapy once liver enzymes are no longer elevated.

## 2012-06-11 NOTE — Transfer of Care (Signed)
Immediate Anesthesia Transfer of Care Note  Patient: Rachel Duran  Procedure(s) Performed: Procedure(s) (LRB) with comments: LAPAROSCOPIC CHOLECYSTECTOMY WITH INTRAOPERATIVE CHOLANGIOGRAM (N/A)  Patient Location: PACU  Anesthesia Type:General  Level of Consciousness: sedated  Airway & Oxygen Therapy: Patient Spontanous Breathing and Patient connected to T-piece oxygen  Post-op Assessment: Report given to PACU RN and Post -op Vital signs reviewed and stable  Post vital signs: Reviewed and stable  Complications: No apparent anesthesia complications

## 2012-06-11 NOTE — Progress Notes (Signed)
Dr. Renold Don in- made aware of patient's condition and CBG results.

## 2012-06-11 NOTE — Anesthesia Postprocedure Evaluation (Signed)
Anesthesia Post Note  Patient: Rachel Duran  Procedure(s) Performed: Procedure(s) (LRB): LAPAROSCOPIC CHOLECYSTECTOMY WITH INTRAOPERATIVE CHOLANGIOGRAM (N/A)  Anesthesia type: General  Patient location: PACU  Post pain: Pain level controlled  Post assessment: Post-op Vital signs reviewed  Last Vitals: BP 115/63  Pulse 72  Temp 36.3 C (Oral)  Resp 24  SpO2 100%  Post vital signs: Reviewed  Level of consciousness: sedated  PACU course: Pt taken to PACU intubated without ventilator support as she was obtunded after emergence. EtCO2 monitoring demonstrated adequate ventilation. After additional time in PACU she became more alert and following commands. Muscle strength good. Sustained head lift. Extubated in without issue. Will continue SpO2 monitoring on the floor.  Complications: No apparent anesthesia complications

## 2012-06-12 ENCOUNTER — Encounter (HOSPITAL_COMMUNITY): Payer: Self-pay | Admitting: General Surgery

## 2012-06-12 ENCOUNTER — Telehealth (INDEPENDENT_AMBULATORY_CARE_PROVIDER_SITE_OTHER): Payer: Self-pay | Admitting: General Surgery

## 2012-06-12 LAB — COMPREHENSIVE METABOLIC PANEL WITH GFR
ALT: 27 U/L (ref 0–35)
AST: 41 U/L — ABNORMAL HIGH (ref 0–37)
Albumin: 3.2 g/dL — ABNORMAL LOW (ref 3.5–5.2)
Alkaline Phosphatase: 78 U/L (ref 39–117)
BUN: 3 mg/dL — ABNORMAL LOW (ref 6–23)
CO2: 27 meq/L (ref 19–32)
Calcium: 9.1 mg/dL (ref 8.4–10.5)
Chloride: 105 meq/L (ref 96–112)
Creatinine, Ser: 0.52 mg/dL (ref 0.50–1.10)
GFR calc Af Amer: >90 mL/min
GFR calc non Af Amer: >90 mL/min
Glucose, Bld: 109 mg/dL — ABNORMAL HIGH (ref 70–99)
Potassium: 4.2 meq/L (ref 3.5–5.1)
Sodium: 140 meq/L (ref 135–145)
Total Bilirubin: 0.6 mg/dL (ref 0.3–1.2)
Total Protein: 5.7 g/dL — ABNORMAL LOW (ref 6.0–8.3)

## 2012-06-12 LAB — CBC
HCT: 31.6 % — ABNORMAL LOW (ref 36.0–46.0)
Hemoglobin: 10.1 g/dL — ABNORMAL LOW (ref 12.0–15.0)
MCH: 29.8 pg (ref 26.0–34.0)
MCHC: 32 g/dL (ref 30.0–36.0)
MCV: 93.2 fL (ref 78.0–100.0)
Platelets: 262 K/uL (ref 150–400)
RBC: 3.39 MIL/uL — ABNORMAL LOW (ref 3.87–5.11)
RDW: 16.2 % — ABNORMAL HIGH (ref 11.5–15.5)
WBC: 7.5 K/uL (ref 4.0–10.5)

## 2012-06-12 NOTE — Telephone Encounter (Signed)
Message copied by Liliana Cline on Tue Jun 12, 2012 11:26 AM ------      Message from: [place], [place]      Created: Tue Jun 12, 2012  9:08 AM       Need nurse only appt for drain removal Friday or Monday - pull if no bile - post cholecystectomy

## 2012-06-12 NOTE — Op Note (Signed)
Rachel Duran, Rachel Duran NO.:  [id number]  MEDICAL RECORD NO.:  [id number]  LOCATION:  1526                         FACILITY:  [facility]  PHYSICIAN:  Mary Sella. Andrey Campanile, MD, FACSDATE OF BIRTH:  1955/10/21  DATE OF PROCEDURE:  06/11/2012 DATE OF DISCHARGE:                              OPERATIVE REPORT   PREOPERATIVE DIAGNOSIS:  Symptomatic cholelithiasis.  POSTOPERATIVE DIAGNOSES: 1. Symptomatic cholelithiasis. 2. Chronic cholecystitis.  PROCEDURE:  Laparoscopic cholecystectomy with intraoperative cholangiogram.  SURGEON:  Mary Sella. Andrey Campanile, MD, FACS  ASSISTANT SURGEON:  None.  ANESTHESIA:  General plus 0.25% Marcaine with epinephrine.  SPECIMEN:  Gallbladder.  EBL:  Minimal.  FINDINGS:  The patient had a dilated, distended gallbladder as well as dilated cystic duct and common hepatic duct.  The cystic duct was very enlarged and had surrounding inflammation, it would not accommodate a titanium clip.  Therefore, two Endoloops were placed around it.  There was possible evidence of a distal filling defects on intraoperative cholangiogram.  Glucagon was administered with an emptying of contrast into the duodenum.  However, there was still a possible distal common bile duct filling defect.  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  Several weeks after starting INH, she became jaundiced and started to having right upper quadrant pain.  Her liver enzymes were elevated.  She had a CT scan, which demonstrated dilated ducts as well as gallstones.  She ultimately underwent an ERCP on May 22, 2012, which demonstrated a common bile duct and dilated intrahepatic ducts.  There was no evidence of stones at that time.  The sphincterotomy was performed.  There was a question of possible ampullary stenosis.  Even though her LFTs normalized, she still  had intermittent right upper quadrant pain radiating to her back.  We discussed the risks and benefits of cholecystectomy including but not limited to, bleeding, infection, injury to surrounding structures, need to convert to an open procedure, bile leak, prolonged diarrhea, blood clot formation, anesthesia risk, possible need for additional procedures, injury to common bile duct, retained gallstones.  We also discussed typical postoperative course.  She elected to proceed to the operating room.  DESCRIPTION OF PROCEDURE:  After obtaining informed consent, the patient was taken to the operating room, placed supine on the operative table. Sequential compression devices were placed.  General endotracheal anesthesia was established.  Her abdomen was prepped and draped in the usual standard surgical fashion.  She received IV Tylenol as well as IV antibiotics prior to skin incision.  A surgical time-out was performed. Local was infiltrated at the base of her umbilicus.  Next, a 1-inch vertical infraumbilical incision was made with a #11 blade.  The fascia was grasped and lifted anteriorly with Kocher's.  The fascia was incised with #11 blade and the abdominal cavity was entered.  A pursestring suture was placed around the fascial edges using a 0 Vicryl on a UR6 needle.  A 12-mm Hasson trocar was placed, the pneumoperitoneum was smoothly established up to a patient pressure of 15 mmHg.  The patient was placed in a reverse Trendelenburg and  rotated to the left.  The laparoscope was advanced and there was no evidence of injury to surrounding structures.  The gallbladder was identified.  Three 5-mm trocars were placed, one in the subxiphoid and two in the right hypochondrium all under direct visualization after local had been infiltrated.  The gallbladder was unable to be grasped, it was very distended.  The gallbladder was aspirated with the suction irrigator catheter.  We were then able to  direct it toward the right shoulder. The infundibulum was grasped and retracted laterally.  I then incised the overlying peritoneum with hook electrocautery both medially and laterally.  The cystic duct was identified as well as the anterior branch of the cystic artery.  The cystic duct appeared quite dilated and I could see the dilated common duct as well.  I stayed high on the gallbladder side for my dissection.  Some of the dissection was done bluntly with the suction irrigator catheter.  I did take down some of the peritoneum both medially and laterally to aid with traction and identification.  The anterior cystic artery was circumferentially dissected out and around with aid of a Teaching laboratory technician.  It did cross the anterior to the cystic duct in order to completely dissect out and around the cystic duct.  I felt I needed to go and take the anterior branch.  It was clipped proximally with two clips and one clip distally. It was then transected with Endo Shears.  I was then able to create a window around the cystic duct.  The posterior branch of the cystic artery was identified at that point.  I continued to mobilize and dissect around the cystic duct to ensure that there were no other structures entering the gallbladder.  I was able to achieve a very large window and achieve a very large critical view.  There were no other structures entering the gallbladder.  The cystic duct was very large. At this point, I needed that a 5-mm clip would not go across the cystic duct as it entered the gallbladder.  Therefore, the 5-mm subxiphoid trocar was enlarged through an 11-mm trocar and the large clip was placed across the gallbladder where the cystic duct entered.  It was then partially transected.  I was able to express want her to stone fragments from the cystic duct.  I was then able to introduce the [facility] cholangiogram catheter and secured with a clip.  The cholangiogram was performed.   Initially demonstrating very dilated cystic, common hepatic left and right ducts as well as a dilated common bile duct.  There was evidence of a filling defect distally, but a width of contrast did get into the duodenum.  At this point, there was some extravasation of contrast around the cholangiogram catheter.  A 1 mg of glucagon was administered and a repeat cholangiogram was performed later.  This reconfirmed the above mentioned findings.  There was ample of contrast into the duodenum at this point.  However, there was still a question of filling defect.  The patient did have an ERCP with sphincterotomy about 2-1/2 to 3 weeks ago, so this could just be localized inflammation from her sphincterotomy and not represent a true stone.  Because this is the cholangiogram catheter, we reestablished pneumoperitoneum, placed the patient back in the operative position.  The clip securing the cholangiogram catheter was removed as well as the cholangiogram catheter.  The cystic duct was very dilated, but had some surrounding induration, it would not accommodate a clip.  Therefore, two PDS Endoloops were placed around it.  The Endoloops appeared to be adequately placed around the cystic duct stump remnant.  We then mobilized the gallbladder up at the gallbladder fossa.  There was some spillage of some contents from the gallbladder; however, no stone spillage.  The gallbladder was eventually freed.  An endobag was placed in the subxiphoid trocar and the gallbladder was placed within an EndoCatch bag.  I then irrigated the right upper quadrant with saline. There was no evidence of any retained stones.  I then obtained hemostasis in the gallbladder fossa.  I then re-irrigated the gallbladder fossa in the right upper quadrant.  There was excellent hemostasis.  There was no evidence of retained stone.  A 19-French Blake drain was placed in the right upper quadrant and brought out through the most lateral  right-sided trocar and secured to the skin with 2-0 nylon. The end of the drain was placed in the gallbladder fossa.  The patient was then placed supine.  We then brought out the gallbladder with EndoCatch bag through the umbilicus.  The previously placed pursestring suture was tied down, thus securing the umbilical closure.  There was nothing within our closure.  There was a little bit of gap.  Therefore, I placed an additional single interrupted 0 Vicryl suture.  Additional local was infiltrated.  Remaining trocars were removed. Pneumoperitoneum was released.  Skin incisions were closed with 4-0 Monocryl in subcuticular fashion followed by application of benzoin, Steri-Strips, and sterile bandages.  The patient was extubated and taken to the recovery room in stable condition.  There were no immediate complications.  The patient tolerated the procedure well.     Mary Sella. Andrey Campanile, MD, FACS     EMW/MEDQ  D:  06/11/2012  T:  06/12/2012  Job:  [id number]  cc:   Venita Lick. Russella Dar, MD, FACG [address] [place] [address]

## 2012-06-12 NOTE — Telephone Encounter (Signed)
Appt made with patient's husband. They will call with any questions.

## 2012-06-12 NOTE — Discharge Summary (Signed)
Physician Discharge Summary  Rachel Duran JXB:[id number] DOB: Jul 15, 1956 DOA: 06/11/2012  PCP: Carrie Mew, MD  Admit date: 06/11/2012 Discharge date: 06/12/2012  Recommendations for Outpatient Follow-up:  1. My nurse will contact you for your appt to have your drain removed - Call if your drain output becomes greenish  Follow-up Information    Follow up with Atilano Ina, MD,FACS. On 07/04/2012. (10:15 AM)    Contact information:   [address] [place] [address] [phone]       Follow up with [facility] Nurse [place]. Schedule an appointment as soon as possible for a visit in 5 days.        Discharge Diagnoses:  1. Symptomatic cholelithiasis  2. Chronic Cholelithiasis 3. 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 Several weeks after starting INH, she became  jaundiced and started to having right upper quadrant pain. Her liver  enzymes were elevated. She had an U/S & CT scan, which demonstrated dilated  ducts as well as gallstones. She ultimately underwent an ERCP on  May 22, 2012, which demonstrated a common bile duct and dilated  intrahepatic ducts. There was no evidence of stones at that time. The  sphincterotomy was performed. There was a question of possible  ampullary stenosis. Even though her LFTs normalized, she still had  intermittent right upper quadrant pain radiating to her back. She elected to have a laparoscopic cholecystectomy. The cystic, CBD, IHD ducts were dilated. There was a question of a filling  defect in the distal CBD on the intraoperative cholangiogram. After glucagon administration, contrast easily entered the bowel. A drain was placed to monitor for a bile leak postoperatively since her cystic duct was very dilated and thickened and a clip wound not hold the cystic duct remnant. 2 PDS endoloops were placed on the cystic duct remnant. She admitted postoperatively for observation. Overnight she did well. On POD 1, she was tolerating a diet, her pain was controlled. She had ambulated and her vitals were stable. She received drain care instructions and I had explained the intraoperative findings.   After discussion with Dr Russella Dar, we agreed to follow her clinically. Her LFTS had normalized preoperatively. It is also possible the questionable filling defect could be inflammation from the sphincterotomy. If her LFTs increase or pain returns, the plan will be to get a MRCP.   BP 102/69  Pulse 78  Temp 98.1 F (36.7 C) (Oral)  Resp 18  Ht 5\' 6"  (1.676 m)  Wt 143 lb (64.864 kg)  BMI 23.08 kg/m2  SpO2 94%  Gen: alert, NAD, non-toxic appearing Pupils: equal, no scleral icterus Pulm: Lungs clear to auscultation, symmetric chest rise CV: regular rate and rhythm Abd: soft, appropriate tenderness, nondistended.  No cellulitis. Drain -serosang Ext: no edema, no calf tenderness Skin: no rash, no jaundice  Discharge Instructions      Discharge Orders    Future Appointments: Provider: Department: Dept Phone: Center:   07/04/2012 10:15 AM Atilano Ina, MD,FACS [facility], [place] [phone] None   07/05/2012 11:00 AM Jonny Ruiz  Carolynn Sayers, MD [facility] [phone] [facility]     Future Orders Please Complete By Expires   Increase activity slowly      Discharge instructions      Comments:   See [facility] discharge instructions       Medication List     As of 06/12/2012  8:30 AM    TAKE these medications         acyclovir 800 MG tablet   Commonly known  as: ZOVIRAX   TAKE ONE-HALF (1/2) TABLET DAILY      ALPRAZolam 1 MG tablet   Commonly known as: XANAX   Take 1 mg by mouth 3 (three) times daily as needed.      AMITIZA 24 MCG capsule   Generic drug: lubiprostone   TAKE ONE CAPSULE BY MOUTH ONCE DAILY WITH BREAKFAST      amLODipine 5 MG tablet   Commonly known as: NORVASC   Take 5 mg by mouth daily.      amphetamine-dextroamphetamine 30 MG 24 hr capsule   Commonly known as: ADDERALL XR   Take 1 capsule (30 mg total) by mouth every morning.      ARIPiprazole 10 MG tablet   Commonly known as: ABILIFY   Take 10 mg by mouth at bedtime.      aspirin 81 MG tablet   Take 81 mg by mouth daily.      baclofen 10 MG tablet   Commonly known as: LIORESAL   TAKE 1 TABLET THREE TIMES A DAY      calcium-vitamin D 500-200 MG-UNIT per tablet   Take 1 tablet by mouth 3 (three) times daily with meals.      cyanocobalamin 1000 MCG/ML injection   Commonly known as: (VITAMIN B-12)   Inject 1,000 mcg into the muscle every 21 ( twenty-one) days.      D 400 PO   Take by mouth.      Eszopiclone 3 MG Tabs   Take 1 tablet (3 mg total) by mouth at bedtime. Take immediately before bedtime      etodolac 400 MG 24 hr tablet   Commonly known as: LODINE XL   Take 400 mg by mouth 2 (two) times daily.      LAMICTAL 150 MG tablet   Generic drug: lamoTRIgine   Take 150 mg by mouth 2 (two) times daily.      lidocaine 5 %   Commonly known as: LIDODERM   Place 3 patches onto the skin daily. Remove & Discard patch within 12 hours or as directed by MD      Manganese 50 MG Tabs   Take 1 tablet by mouth daily.      morphine 15 MG tablet   Commonly known as: MSIR   Take 1 tablet by mouth as needed.      morphine 90 MG 24 hr capsule   Commonly known as: AVINZA   Take 1 capsule (90 mg total) by mouth daily.      NASACORT AQ NA   2 sprays by Nasal route daily.      ondansetron 4 MG tablet   Commonly known as: ZOFRAN   Take 1 tablet (4 mg total)  by mouth every 8 (eight) hours as needed.      OVER THE COUNTER MEDICATION   Take 1,000 mg by mouth 3 (three) times daily. Calcium Plain OTC      pantoprazole 40 MG tablet   Commonly known as: PROTONIX  Take 1 tablet daily      pentosan polysulfate 100 MG capsule   Commonly known as: ELMIRON   Take 100 mg by mouth. 2 by mouth twice a day        pregabalin 150 MG capsule   Commonly known as: LYRICA   Take 150 mg by mouth 3 (three) times daily.      VAGIFEM 10 MCG Tabs   Generic drug: Estradiol   Place 1 tablet vaginally 2 (two) times a week.        Follow-up Information    Follow up with Atilano Ina, MD,FACS. On 07/04/2012. (10:15 AM)    Contact information:   [address] [place] [address] [phone]       Follow up with [facility] [place]. Schedule an appointment as soon as possible for a visit in 5 days.          The results of significant diagnostics from this hospitalization (including imaging, microbiology, ancillary and laboratory) are listed below for reference.    Significant Diagnostic Studies: Dg Cholangiogram Operative  06/11/2012  *RADIOLOGY REPORT*  Clinical Data:   Symptomatic cholelithiasis. Elevated LFTs.  Right upper quadrant pain.  INTRAOPERATIVE CHOLANGIOGRAM  Technique:  Cholangiographic images from the C-arm fluoroscopic device were submitted for interpretation post-operatively.  Please see the procedural report for the amount of contrast and the fluoroscopy time utilized.  Comparison:  ERCP 05/22/2012, ultrasound 05/15/2012  Findings:  Intraoperative study is performed by Dr. Andrey Campanile.  There is opacification of dilated common bile duct.  Intrahepatic ducts are also opacified, showing mild dilatation.  There is extravasation of contrast along the inferior aspect of the liver. Surgical clips overlie the cystic duct remnant.  Within the distal duct, there is a rounded filling defect, consistent with retained stone.  Less likely,  findings could be related to edema at the sphincterotomy site.  IMPRESSION:  1.  Dilated common bile duct and intrahepatic ducts. 2.  Findings are suspicious for distal common bile duct stone. Less likely, edema at the sphincterotomy site could have this appearance. 3.  Extravasation of contrast along the inferior edge of the liver.  The findings were discussed with Dr. Andrey Campanile on 06/12/2013 at 9:24 am.   Original Report Authenticated By: Norva Pavlov, M.D.    US Abdomen Complete  05/15/2012  *RADIOLOGY REPORT*  Clinical Data:  Elevated liver function tests.  Nausea.  Right- sided abdominal pain.  COMPLETE ABDOMINAL ULTRASOUND  Comparison:  09/05/2017 abdominal pelvic CT.  Findings:  Gallbladder:  Contracted.  Stones within.  No wall thickening or pericholecystic fluid.  There is tenderness with gallbladder palpation per technologist notes.  Common bile duct: Dilated, at 12 mm maximally.  Upper normal in this age group 6 mm.  Increased from approximately 8 mm on 09/06/2007.  Liver: Within normal limits.  No intrahepatic biliary ductal dilatation.  IVC: Poorly visualized due to overlying bowel gas.  Pancreas:  Poorly visualized due to overlying bowel gas.  Spleen:  Normal in size and echogenicity.  Right Kidney:  12.1 cm. No hydronephrosis.  Left Kidney:  11.4 cm. No hydronephrosis.  Abdominal aorta:  Nonaneurysmal without ascites.  IMPRESSION:  1.  Contracted gallbladder with stones.  Tenderness with gallbladder palpation, without pericholecystic fluid or definite wall thickening.  Findings could represent chronic cholecystitis (presuming the patient was n.p.o.).  Given tenderness, a component of acute cholecystitis cannot be entirely excluded, but is felt unlikely. 2.  Common duct dilatation.  No visualized obstructive  stone or mass.  Further evaluation with MRCP or contrast enhanced CT should be considered to exclude obstructive stone or mass (MRCP of increased sensitivity relative to CT for  choledocholithiasis).  These results will be called to the ordering clinician or representative by the Radiologist Assistant, and communication documented in the PACS Dashboard.   Original Report Authenticated By: Consuello Bossier, M.D.    Ct Abdomen Pelvis W Contrast  05/17/2012  *RADIOLOGY REPORT*  Clinical Data: Elevated LFTs, dilated bowel, evaluate pancreas. Right upper quadrant pain 3 weeks.  CT ABDOMEN AND PELVIS WITH CONTRAST  Technique:  Multidetector CT imaging of the abdomen and pelvis was performed following the standard protocol during bolus administration of intravenous contrast.  Contrast: OMNIPAQUE IOHEXOL 300 MG/ML  SOLN  Comparison: CT abdomen 09/05/2017,  ultrasound 05/15/2012  Findings: Lung bases are clear.  No pericardial fluid. Subglandular breast implants are noted.  No focal hepatic lesion.  Mild intrahepatic biliary dilatation. The common hepatic duct and common bile duct are uniformly dilated. The common bile duct measures 9 mm through the pancreatic head. There is no obstructing lesion evident in the pancreatic head. Pancreatic duct is normal caliber.  There are multiple gallstones within a contracted gallbladder.  No pericholecystic fluid.  The pancreas body and tail appear normal.  The spleen, adrenal glands, and kidneys are normal.  The stomach, small bowel, cecum are normal.  There is mild diffuse thickening of the sigmoid colon which may simply represent nondistension.  Abdominal aorta is normal caliber.  No retroperitoneal or periportal lymphadenopathy.  No free fluid in the pelvis.  The bladder and uterus are normal.  T No pelvic lymphadenopathy. Review of  bone windows demonstrates no aggressive osseous lesions.  Impression: 1.  Uniform dilatation of the common hepatic duct and common bile duct without obstructing lesion identified. Consider MRCP or ERCP for further evaluation.  2.  No evidence  pancreatic head mass. Cannot exclude an ampullary lesion.  3.  Multiple  cholesterol gallstones within the collapsed gallbladder.   Original Report Authenticated By: Genevive Bi, M.D.    Dg Ercp With Sphincterotomy  05/22/2012  *RADIOLOGY REPORT*  Clinical Data: Elevated LFTs.  ERCP  Comparison:  CT 05/17/2012  Technique:  Multiple spot images obtained with the fluoroscopic device and submitted for interpretation post-procedure.  ERCP was performed by Dr. Russella Dar.  Findings: Wire was advanced into the intrahepatic biliary system. The common bile duct and intrahepatic bile ducts were opacified with contrast.  Common bile duct is prominent and similar to the prior CT findings.  Contrast fills the cystic duct and a decompressed gallbladder with multiple stones. A balloon sweep was performed in the common bile duct.  The final three images demonstrate contrast pooling along the right side of the common bile duct.  This contrast appears to be within the duodenum.  IMPRESSION: Cannulization and opacification of the common bile duct.   A balloon sweep was performed for stone extraction.  Decompressed gallbladder containing stones.  These images were submitted for radiologic interpretation only. Please see the procedural report for the amount of contrast and the fluoroscopy time utilized.   Original Report Authenticated By: Richarda Overlie, M.D.     Microbiology: Recent Results (from the past 240 hour(s))  SURGICAL PCR SCREEN     Status: Normal   Collection Time   06/06/12 10:41 AM      Component Value Range Status Comment   MRSA, PCR NEGATIVE  NEGATIVE Final    Staphylococcus aureus NEGATIVE  NEGATIVE Final      Labs: Basic Metabolic Panel:  Lab 06/12/12 1610 06/06/12 1140  NA 140 141  K 4.2 4.2  CL 105 104  CO2 27 30  GLUCOSE 109* 101*  BUN 3* 8  CREATININE 0.52 0.61  CALCIUM 9.1 9.5  MG -- --  PHOS -- --   Liver Function Tests:  Lab 06/12/12 0446 06/06/12 1140  AST 41* 26  ALT 27 20  ALKPHOS 78 99  BILITOT 0.6 0.5  PROT 5.7* 6.7  ALBUMIN 3.2* 3.9   CBC:  Lab 06/12/12 0446 06/06/12 1140  WBC 7.5 7.9  NEUTROABS -- 5.3  HGB 10.1* 9.8*  HCT 31.6* 30.5*  MCV 93.2 92.7  PLT 262 322   CBG:  Lab 06/11/12 0943  GLUCAP 226*    Active Problems:  ANEMIA OF CHRONIC DISEASE  CYSTITIS, CHRONIC INTERSTITIAL  NECK PAIN, CHRONIC  FIBROMYALGIA, SEVERE  SYMPTOM, MALAISE AND FATIGUE NEC  Symptomatic cholelithiasis   Time coordinating discharge: 20 minutes  Signed:  Atilano Ina, MD [facility], [place] [phone] 06/12/2012, 8:30 AM

## 2012-06-12 NOTE — Care Management Note (Signed)
    Page 1 of 1   06/12/2012     12:36:23 PM   CARE MANAGEMENT NOTE 06/12/2012  Patient:  Rachel Duran, Rachel Duran   Account Number:  [id number]  Date Initiated:  06/12/2012  Documentation initiated by:  Lorenda Ishihara  Subjective/Objective Assessment:     Action/Plan:   Anticipated DC Date:  06/12/2012   Anticipated DC Plan:  HOME/SELF CARE      DC Planning Services  CM consult      Choice offered to / List presented to:             Status of service:  Completed, signed off Medicare Important Message given?   (If response is "NO", the following Medicare IM given date fields will be blank) Date Medicare IM given:   Date Additional Medicare IM given:    Discharge Disposition:  HOME/SELF CARE  Per UR Regulation:  Reviewed for med. necessity/level of care/duration of stay  If discussed at Long Length of Stay Meetings, dates discussed:    Comments:

## 2012-06-15 ENCOUNTER — Encounter (INDEPENDENT_AMBULATORY_CARE_PROVIDER_SITE_OTHER): Payer: Self-pay | Admitting: General Surgery

## 2012-06-15 ENCOUNTER — Ambulatory Visit (INDEPENDENT_AMBULATORY_CARE_PROVIDER_SITE_OTHER): Payer: 59 | Admitting: General Surgery

## 2012-06-15 VITALS — BP 118/72 | HR 70 | Temp 97.8°F | Resp 18 | Ht 66.0 in | Wt 141.5 lb

## 2012-06-15 DIAGNOSIS — Z5189 Encounter for other specified aftercare: Secondary | ICD-10-CM

## 2012-06-15 NOTE — Progress Notes (Signed)
Patient came into clinic today for drain check after laparoscopic cholecystectomy w/IOC on 06/11/12. The patient did not have any sign of fever. And there was no sign of infection or redness at drain site. The fluid removed from the drain today was approximately 20 cc. The patient drainage record shows she has been removing fluid from the drain three times per day with a total of 90 ml (90 cc) removed on 06/14/12. The color of the fluid in the drain today was shown to Dr. Luisa Hart who agreed the fluid did not show sign of infection and the color was appropriate. Due to the amount of drainage the patient has been scheduled to come back for another nurse visit on 06/18/12 to determine if the drainage has slowed enough to remove the drain. Patient advised that is she develops fever or if the drain site develops redness shows sign of infection to call our office right away and advised an on call doctor is available after hours. The patient agreed.

## 2012-06-18 ENCOUNTER — Ambulatory Visit (INDEPENDENT_AMBULATORY_CARE_PROVIDER_SITE_OTHER): Payer: 59 | Admitting: General Surgery

## 2012-06-18 DIAGNOSIS — Z4889 Encounter for other specified surgical aftercare: Secondary | ICD-10-CM

## 2012-06-18 DIAGNOSIS — Z4803 Encounter for change or removal of drains: Secondary | ICD-10-CM

## 2012-06-18 NOTE — Progress Notes (Signed)
Patient comes in status post laparoscopic cholecystectomy with drain in place. Drainage is light yellow/pinkish and has been since surgery. No odor. Drain has been draining around 20-30 cc daily since Saturday. Drain was removed without difficulty and drain site covered with dry 4 x 4 gauze/triple antibiotic ointment. Patient instructed to call if increased abdominal pain, fevers, or foul smelling drainage. Will keep appt on 07/04/12 with Dr Andrey Campanile.

## 2012-06-18 NOTE — Patient Instructions (Signed)
Call with any nausea, vomiting, fever, abdominal pain or foul drainage from wound site. Keep covered for 24 hours, then cover as needed. Keep appt with Dr Andrey Campanile.

## 2012-06-26 ENCOUNTER — Telehealth: Payer: Self-pay

## 2012-06-26 NOTE — Telephone Encounter (Signed)
Message copied by Annett Fabian on Tue Jun 26, 2012  4:27 PM ------      Message from: Claudette Head T      Created: Tue Jun 26, 2012  3:44 PM       LFTs normal on 11/13 so repeat LFTs are not needed. She can follow up with her PCP for routine care.            ----- Message -----         From: Rossie Muskrat, RN,CGRN         Sent: 06/26/2012   3:30 PM           To: Meryl Dare, MD,FACG            Patient needed follow up LFT from ERCP on 05/22/12.  Dr. Russella Dar please review CMET from 06/12/12.  Please advise if she will need any additional labs

## 2012-06-26 NOTE — Telephone Encounter (Signed)
Patient advised.

## 2012-07-04 ENCOUNTER — Ambulatory Visit (INDEPENDENT_AMBULATORY_CARE_PROVIDER_SITE_OTHER): Payer: 59 | Admitting: General Surgery

## 2012-07-04 ENCOUNTER — Encounter (INDEPENDENT_AMBULATORY_CARE_PROVIDER_SITE_OTHER): Payer: Self-pay | Admitting: General Surgery

## 2012-07-04 VITALS — BP 110/70 | HR 88 | Resp 18 | Ht 66.0 in | Wt 142.0 lb

## 2012-07-04 DIAGNOSIS — Z9049 Acquired absence of other specified parts of digestive tract: Secondary | ICD-10-CM

## 2012-07-04 DIAGNOSIS — Z9889 Other specified postprocedural states: Secondary | ICD-10-CM

## 2012-07-04 DIAGNOSIS — R7989 Other specified abnormal findings of blood chemistry: Secondary | ICD-10-CM

## 2012-07-04 LAB — HEPATIC FUNCTION PANEL
ALT: 15 U/L (ref 0–35)
AST: 22 U/L (ref 0–37)
Albumin: 4.2 g/dL (ref 3.5–5.2)
Bilirubin, Direct: 0.1 mg/dL (ref 0.0–0.3)
Total Protein: 6.4 g/dL (ref 6.0–8.3)

## 2012-07-04 NOTE — Patient Instructions (Signed)
We will check your liver function. If they are normal - you can resume the TB medication

## 2012-07-04 NOTE — Progress Notes (Signed)
Subjective:     Patient ID: Rachel Duran, female   DOB: 11/02/1955, 56 y.o.   MRN: [id number]  HPI 56 year old Caucasian female comes in for followup after undergoing laparoscopic cholecystectomy with cholangiogram on November 18. She had undergone a preoperative ERCP with sphincterotomy due to elevated LFTs and dilated ducts. Intraoperatively the contrast emptied into the duodenum but there is a question of a possible distal common bile duct filling defect. She states that she has been doing well. She denies any fevers or chills. She denies any nausea or vomiting. She denies any diarrhea or constipation. She denies any jaundice. She states that she does have some episodes of sharp pain in her right upper quadrant underneath the rib cage that only lasts for a few seconds at a time. It mainly occurs when she is sedentary. It is not related to food intake. She also has noticed that when she leans over to brush her teeth. It is transient and is not associated with any other symptoms. She reports a good appetite  Review of Systems     Objective:   Physical Exam BP 110/70  Pulse 88  Resp 18  Ht 5\' 6"  (1.676 m)  Wt 142 lb (64.411 kg)  BMI 22.92 kg/m2  Gen: alert, NAD, non-toxic appearing Pupils: equal, no scleral icterus Pulm: Lungs clear to auscultation, symmetric chest rise CV: regular rate and rhythm Abd: soft, nontender, nondistended. Well-healed trocar sites. No cellulitis. No incisional hernia Ext: no edema, no calf tenderness Skin: no rash, no jaundice     Assessment:     S/p Laparoscopic cholecystectomy with IOC     Plan:     We reviewed her pathology report which showed chronic cholecystitis and cholelithiasis. I believe the right upper quadrant pain is mainly postoperative in nature and will continue to get better with time. However she did have dilated ducts preoperatively as well as a questionable distal common bile duct filling defect on her interoperative cholangiogram.  Therefore I have recommended that we go ahead and check her liver function tests. If they are abnormal then I think she needs an MRCP. We will base her followup on the results of her LFTs. If her liver function tests are normal then I think she is okay to resume INH therapy. If her symptoms persist regardless of her LFTs I think she would probably benefit from MRCP  Mary Sella. Andrey Campanile, MD, FACS General, Bariatric, & Minimally Invasive Surgery [facility], [place]

## 2012-07-05 ENCOUNTER — Ambulatory Visit (INDEPENDENT_AMBULATORY_CARE_PROVIDER_SITE_OTHER): Payer: 59 | Admitting: Internal Medicine

## 2012-07-05 ENCOUNTER — Encounter: Payer: Self-pay | Admitting: Internal Medicine

## 2012-07-05 ENCOUNTER — Telehealth (INDEPENDENT_AMBULATORY_CARE_PROVIDER_SITE_OTHER): Payer: Self-pay | Admitting: General Surgery

## 2012-07-05 VITALS — BP 120/74 | HR 72 | Temp 98.2°F | Resp 16 | Ht 66.0 in | Wt 142.0 lb

## 2012-07-05 DIAGNOSIS — F988 Other specified behavioral and emotional disorders with onset usually occurring in childhood and adolescence: Secondary | ICD-10-CM

## 2012-07-05 DIAGNOSIS — IMO0001 Reserved for inherently not codable concepts without codable children: Secondary | ICD-10-CM

## 2012-07-05 DIAGNOSIS — R1011 Right upper quadrant pain: Secondary | ICD-10-CM

## 2012-07-05 MED ORDER — AMPHETAMINE-DEXTROAMPHET ER 30 MG PO CP24: 30.0000 mg | ORAL_CAPSULE | ORAL | Status: DC

## 2012-07-05 MED ORDER — OXYMORPHONE HCL ER 30 MG PO TB12: 30.0000 mg | ORAL_TABLET | Freq: Two times a day (BID) | ORAL | Status: DC

## 2012-07-05 NOTE — Progress Notes (Signed)
Subjective:    Patient ID: Rachel Duran, female    DOB: September 18, 1955, 56 y.o.   MRN: [id number]  HPI  The patient had a cholecystectomy And feels better INH resumed for TB so that she can  Review of Systems  Constitutional: Negative for activity change, appetite change and fatigue.  HENT: Negative for ear pain, congestion, neck pain, postnasal drip and sinus pressure.   Eyes: Negative for redness and visual disturbance.  Respiratory: Negative for cough, shortness of breath and wheezing.   Gastrointestinal: Negative for abdominal pain and abdominal distention.  Genitourinary: Negative for dysuria, frequency and menstrual problem.  Musculoskeletal: Negative for myalgias, joint swelling and arthralgias.  Skin: Negative for rash and wound.  Neurological: Negative for dizziness, weakness and headaches.  Hematological: Negative for adenopathy. Does not bruise/bleed easily.  Psychiatric/Behavioral: Negative for sleep disturbance and decreased concentration.   Past Medical History  Diagnosis Date  . Disorders of porphyrin metabolism   . Irritable bowel syndrome   . Fibromyalgia   . Arthritis   . GERD (gastroesophageal reflux disease)   . Depression   . Allergy   . Chronic pain   . Cholecystitis   . Internal hemorrhoids   . PONV (postoperative nausea and vomiting)   . Interstitial cystitis 06-06-12    hx.  . Raynauds disease     hx.  . Cervical disc disease     hx. C6- C7 -hx. past fusion(bone graft used)    History   Social History  . Marital Status: Married    Spouse Name: N/A    Number of Children: N/A  . Years of Education: N/A   Occupational History  . retired    Social History Main Topics  . Smoking status: Never Smoker   . Smokeless tobacco: Never Used  . Alcohol Use: No  . Drug Use: No  . Sexually Active: Yes   Other Topics Concern  . Not on file   Social History Narrative  . No narrative on file    Past Surgical History  Procedure Date  .  Cervical fusion   . Cervical lamination   . Cystoscopy   . Nasal sinus surgery   . Gyn surgery   . Hysteroscopy with ablation   . Breast enhancement surgery 2010  . Nasal sinus surgery     x5  . Ercp 05/22/2012    Procedure: ENDOSCOPIC RETROGRADE CHOLANGIOPANCREATOGRAPHY (ERCP);  Surgeon: Meryl Dare, MD,FACG;  Location: [facility];  Service: Endoscopy;  Laterality: N/A;  . Cesarean section   . Hernia repair     inguinal  . Cholecystectomy 06/11/2012    Procedure: LAPAROSCOPIC CHOLECYSTECTOMY WITH INTRAOPERATIVE CHOLANGIOGRAM;  Surgeon: Atilano Ina, MD,FACS;  Location: [facility];  Service: General;  Laterality: N/A;    Family History  Problem Relation Age of Onset  . Heart disease Father   . Esophageal cancer Father   . Scleroderma Mother   . Cancer Mother     esophageal & stomach  . Cancer Paternal Grandfather     esophageal & stomach    Allergies  Allergen Reactions  . Codeine Phosphate Nausea And Vomiting    Severe n&v  . Cymbalta (Duloxetine Hcl)     Urinary retention  . Inh (Isoniazid)     Hepatitis  . Meperidine Hcl Rash    REACTION: red skin  . Sulfamethoxazole Rash    Current Outpatient Prescriptions on File Prior to Visit  Medication Sig Dispense Refill  . acyclovir (ZOVIRAX) 800 MG tablet  TAKE ONE-HALF (1/2) TABLET DAILY  90 tablet  2  . ALPRAZolam (XANAX) 1 MG tablet Take 1 mg by mouth 3 (three) times daily as needed.        . AMITIZA 24 MCG capsule TAKE ONE CAPSULE BY MOUTH ONCE DAILY WITH BREAKFAST  30 each  6  . amLODipine (NORVASC) 5 MG tablet Take 5 mg by mouth daily.        Marland Kitchen amphetamine-dextroamphetamine (ADDERALL XR) 30 MG 24 hr capsule Take 1 capsule (30 mg total) by mouth every morning.  30 capsule  0  . ARIPiprazole (ABILIFY) 10 MG tablet Take 10 mg by mouth at bedtime.       Marland Kitchen aspirin 81 MG tablet Take 81 mg by mouth daily.        . baclofen (LIORESAL) 10 MG tablet TAKE 1 TABLET THREE TIMES A DAY  270 tablet  2  . Calcium  Carbonate-Vitamin D (CALCIUM-VITAMIN D) 500-200 MG-UNIT per tablet Take 1 tablet by mouth 3 (three) times daily with meals.        . Cholecalciferol (D 400 PO) Take by mouth.        . cyanocobalamin (,VITAMIN B-12,) 1000 MCG/ML injection Inject 1,000 mcg into the muscle every 21 ( twenty-one) days.      . Eszopiclone (ESZOPICLONE) 3 MG TABS Take 1 tablet (3 mg total) by mouth at bedtime. Take immediately before bedtime  30 tablet  3  . etodolac (LODINE XL) 400 MG 24 hr tablet Take 400 mg by mouth 2 (two) times daily.       Marland Kitchen lamoTRIgine (LAMICTAL) 150 MG tablet Take 150 mg by mouth 2 (two) times daily.        Marland Kitchen lidocaine (LIDODERM) 5 % Place 3 patches onto the skin daily. Remove & Discard patch within 12 hours or as directed by MD       . Manganese 50 MG TABS Take 1 tablet by mouth daily.      Marland Kitchen morphine (AVINZA) 90 MG 24 hr capsule Take 1 capsule (90 mg total) by mouth daily.  30 capsule  0  . morphine (MSIR) 15 MG tablet Take 1 tablet by mouth as needed.      . ondansetron (ZOFRAN) 4 MG tablet Take 1 tablet (4 mg total) by mouth every 8 (eight) hours as needed.  30 tablet  6  . OVER THE COUNTER MEDICATION Take 1,000 mg by mouth 3 (three) times daily. Calcium Plain OTC      . pantoprazole (PROTONIX) 40 MG tablet Take 1 tablet daily  90 tablet  3  . pentosan polysulfate (ELMIRON) 100 MG capsule Take 100 mg by mouth. 2 by mouth twice a day        . pregabalin (LYRICA) 150 MG capsule Take 150 mg by mouth 3 (three) times daily.        . Triamcinolone Acetonide (NASACORT AQ NA) 2 sprays by Nasal route daily.        Marland Kitchen VAGIFEM 10 MCG TABS Place 1 tablet vaginally 2 (two) times a week.      . [DISCONTINUED] [facility] SLOW RELEASE IRON 143 (45 FE) MG TBCR TAKE 1 TABLET EVERY DAY  30 tablet  0    BP 120/74  Pulse 72  Temp 98.2 F (36.8 C)  Resp 16  Ht 5\' 6"  (1.676 m)  Wt 142 lb (64.411 kg)  BMI 22.92 kg/m2       Objective:   Physical Exam  Nursing note and vitals  reviewed. Constitutional: She is  oriented to person, place, and time. She appears well-developed and well-nourished. No distress.  HENT:  Head: Normocephalic and atraumatic.  Right Ear: External ear normal.  Left Ear: External ear normal.  Nose: Nose normal.  Mouth/Throat: Oropharynx is clear and moist.  Eyes: Conjunctivae normal and EOM are normal. Pupils are equal, round, and reactive to light.  Neck: Normal range of motion. Neck supple. No JVD present. No tracheal deviation present. No thyromegaly present.  Cardiovascular: Normal rate, regular rhythm, normal heart sounds and intact distal pulses.   No murmur heard. Pulmonary/Chest: Effort normal and breath sounds normal. She has no wheezes. She exhibits no tenderness.  Abdominal: Soft. Bowel sounds are normal.  Musculoskeletal: Normal range of motion. She exhibits no edema and no tenderness.  Lymphadenopathy:    She has no cervical adenopathy.  Neurological: She is alert and oriented to person, place, and time. She has normal reflexes. No cranial nerve deficit.  Skin: Skin is warm and dry. She is not diaphoretic.  Psychiatric: She has a normal mood and affect. Her behavior is normal.          Assessment & Plan:  May resume the INH To resume the medication for her pain ( scheduled to start embrel) May resume plaque after first liver enzymes on the INH that are normal

## 2012-07-05 NOTE — Telephone Encounter (Signed)
Message copied by Liliana Cline on Thu Jul 05, 2012  2:27 PM ------      Message from: Andrey Campanile, ERIC M      Created: Thu Jul 05, 2012  2:22 PM       pls call pt with results. Labs normal. If that pain doesn't resolve in the next 2 weeks, i need to know

## 2012-07-05 NOTE — Patient Instructions (Addendum)
Ask for Adline Mango NP when you cannot see me Presents from Dr. Roxan Hockey that I had given me permission to resume the plaquinil  if you have normal enzymes after one month of INH therapy

## 2012-07-05 NOTE — Telephone Encounter (Signed)
Patient aware.

## 2012-07-05 NOTE — Telephone Encounter (Signed)
Left message on machine for patient to call back and ask for me. To give patient labs results and make her aware to call in two weeks if no better.

## 2012-07-10 ENCOUNTER — Other Ambulatory Visit: Payer: Self-pay | Admitting: *Deleted

## 2012-07-10 MED ORDER — ESZOPICLONE 3 MG PO TABS: 3.0000 mg | ORAL_TABLET | Freq: Every day | ORAL | Status: DC

## 2012-07-12 ENCOUNTER — Telehealth (INDEPENDENT_AMBULATORY_CARE_PROVIDER_SITE_OTHER): Payer: Self-pay | Admitting: General Surgery

## 2012-07-12 NOTE — Telephone Encounter (Signed)
Per pt request, a copy of her recent LFT results was [facility]d [facility] [phone]) to attn:  Diane.

## 2012-07-20 ENCOUNTER — Other Ambulatory Visit (HOSPITAL_BASED_OUTPATIENT_CLINIC_OR_DEPARTMENT_OTHER): Payer: Self-pay | Admitting: Rheumatology

## 2012-07-20 DIAGNOSIS — R52 Pain, unspecified: Secondary | ICD-10-CM

## 2012-07-24 ENCOUNTER — Telehealth: Payer: Self-pay | Admitting: Internal Medicine

## 2012-07-24 MED ORDER — HYDROCORTISONE ACETATE 25 MG RE SUPP: 25.0000 mg | Freq: Two times a day (BID) | RECTAL | Status: DC

## 2012-07-24 NOTE — Telephone Encounter (Signed)
Patient Information:  Caller Name: Rachel Duran  Phone: [phone]  Patient: Rachel Duran, Rachel Duran  Gender: Female  DOB: 1956-06-07  Age: 56 Years  PCP: Rachel Duran (Adults only)  Pregnant: No  Office Follow Up:  Does the office need to follow up with this patient?: Yes  Instructions For The Office: Patient prefers Rx for hemorrhoid ointment krs/can  RN Note:  Patient states it has been years since hemorrhoid flare, and in the past Dr. Lovell Duran has called in an ointment "Procto-something" per patient.  States new onset hemorrhoids after gall bladder surgery and not improving despite sitz baths and home care.  Per rectal symtpoms protocol, advised appt within 24 hours; no appt available within dispositioned time frame.  Patient states she prefers Rx be called in for ointment; info to office for staff/provider review/Rx/callback.  [facility]ses [facility].  May reach patient at [phone]; may leave message.  krs/can  Symptoms  Reason For Call & Symptoms: hemorrhoids  Reviewed Health History In EMR: Yes  Reviewed Medications In EMR: Yes  Reviewed Allergies In EMR: Yes  Reviewed Surgeries / Procedures: Yes  Date of Onset of Symptoms: Unknown OB / GYN:  LMP: Unknown  Guideline(s) Used:  Rectal Symptoms  Disposition Per Guideline:   See Today or Tomorrow in Office  Reason For Disposition Reached:   Home treatment > 3 days for rectal pain and not improved  Advice Given:  N/A

## 2012-07-26 ENCOUNTER — Telehealth: Payer: Self-pay | Admitting: Internal Medicine

## 2012-07-26 ENCOUNTER — Other Ambulatory Visit: Payer: Self-pay | Admitting: *Deleted

## 2012-07-26 MED ORDER — MORPHINE SULFATE ER BEADS 90 MG PO CP24: 90.0000 mg | ORAL_CAPSULE | Freq: Every day | ORAL | Status: DC

## 2012-07-26 NOTE — Telephone Encounter (Signed)
Ok to change per dr Lovell Sheehan- scripts are ready for pick up

## 2012-07-26 NOTE — Telephone Encounter (Signed)
Patient calling, she cannot take the Opana any longer.  Each time she takes same she has dizziness, nausea and cold chills.   She is asking to be placed back on the Avinza.  She is asking for a script for 30 days to have filled at local pharmacy and a 90 day script to send mail order.  Her health insurance will now cover narcotics and provide a 90 day supply.   She would like to pick up this script today.  Advised that she will need to call the front line after 2p to see if same is ready for pick up.

## 2012-07-31 ENCOUNTER — Ambulatory Visit (HOSPITAL_BASED_OUTPATIENT_CLINIC_OR_DEPARTMENT_OTHER)
Admission: RE | Admit: 2012-07-31 | Discharge: 2012-07-31 | Disposition: A | Discharge status: Home / Self Care | Payer: 59 | Source: Ambulatory Visit | Attending: Rheumatology | Admitting: Rheumatology

## 2012-07-31 DIAGNOSIS — M25579 Pain in unspecified ankle and joints of unspecified foot: Secondary | ICD-10-CM | POA: Insufficient documentation

## 2012-07-31 DIAGNOSIS — R52 Pain, unspecified: Secondary | ICD-10-CM

## 2012-08-01 ENCOUNTER — Encounter: Payer: Self-pay | Admitting: Internal Medicine

## 2012-08-02 ENCOUNTER — Other Ambulatory Visit: Payer: Self-pay | Admitting: Obstetrics and Gynecology

## 2012-08-02 DIAGNOSIS — Z1231 Encounter for screening mammogram for malignant neoplasm of breast: Secondary | ICD-10-CM

## 2012-08-02 MED ORDER — HYDROCORTISONE ACETATE 25 MG RE SUPP: 25.0000 mg | Freq: Two times a day (BID) | RECTAL | Status: DC

## 2012-08-12 ENCOUNTER — Other Ambulatory Visit: Payer: Self-pay | Admitting: Internal Medicine

## 2012-09-10 ENCOUNTER — Ambulatory Visit (INDEPENDENT_AMBULATORY_CARE_PROVIDER_SITE_OTHER): Payer: 59 | Admitting: Internal Medicine

## 2012-09-10 ENCOUNTER — Encounter: Payer: Self-pay | Admitting: Internal Medicine

## 2012-09-10 VITALS — BP 124/80 | HR 72 | Temp 98.3°F | Resp 16 | Ht 66.0 in | Wt 142.0 lb

## 2012-09-10 DIAGNOSIS — R110 Nausea: Secondary | ICD-10-CM

## 2012-09-10 DIAGNOSIS — F988 Other specified behavioral and emotional disorders with onset usually occurring in childhood and adolescence: Secondary | ICD-10-CM

## 2012-09-10 DIAGNOSIS — T887XXA Unspecified adverse effect of drug or medicament, initial encounter: Secondary | ICD-10-CM

## 2012-09-10 DIAGNOSIS — R7989 Other specified abnormal findings of blood chemistry: Secondary | ICD-10-CM

## 2012-09-10 MED ORDER — ONDANSETRON HCL 4 MG PO TABS: 4.0000 mg | ORAL_TABLET | Freq: Three times a day (TID) | ORAL | Status: DC | PRN

## 2012-09-10 MED ORDER — AMPHETAMINE-DEXTROAMPHET ER 30 MG PO CP24: 30.0000 mg | ORAL_CAPSULE | ORAL | Status: DC

## 2012-09-10 NOTE — Patient Instructions (Signed)
The patient is instructed to continue all medications as prescribed. Schedule followup with check out clerk upon leaving the clinic  

## 2012-09-10 NOTE — Progress Notes (Signed)
Subjective:    Patient ID: Rachel Duran, female    DOB: March 15, 1956, 57 y.o.   MRN: [id number]  HPI Increased  LFTs on INH for prep for Embrel injections Possible interaction with medications vs  Alternative regimen  Fells symptomatically "bad" on the INH Has repeat LFTs off the medications feeling better Must consider duct stone if LFT's do not improve.  Review of Systems  Constitutional: Negative for activity change, appetite change and fatigue.  HENT: Negative for ear pain, congestion, neck pain, postnasal drip and sinus pressure.   Eyes: Negative for redness and visual disturbance.  Respiratory: Negative for cough, shortness of breath and wheezing.   Gastrointestinal: Positive for nausea and abdominal pain. Negative for abdominal distention.  Genitourinary: Negative for dysuria, frequency and menstrual problem.       Dark urine  Musculoskeletal: Negative for myalgias, joint swelling and arthralgias.  Skin: Positive for color change. Negative for rash and wound.       jaundice  Neurological: Negative for dizziness, weakness and headaches.  Hematological: Negative for adenopathy. Does not bruise/bleed easily.  Psychiatric/Behavioral: Negative for sleep disturbance and decreased concentration.   Past Medical History  Diagnosis Date  . Disorders of porphyrin metabolism   . Irritable bowel syndrome   . Fibromyalgia   . Arthritis   . GERD (gastroesophageal reflux disease)   . Depression   . Allergy   . Chronic pain   . Cholecystitis   . Internal hemorrhoids   . PONV (postoperative nausea and vomiting)   . Interstitial cystitis 06-06-12    hx.  . Raynauds disease     hx.  . Cervical disc disease     hx. C6- C7 -hx. past fusion(bone graft used)    History   Social History  . Marital Status: Married    Spouse Name: N/A    Number of Children: N/A  . Years of Education: N/A   Occupational History  . retired    Social History Main Topics  . Smoking status: Never  Smoker   . Smokeless tobacco: Never Used  . Alcohol Use: No  . Drug Use: No  . Sexually Active: Yes   Other Topics Concern  . Not on file   Social History Narrative  . No narrative on file    Past Surgical History  Procedure Laterality Date  . Cervical fusion    . Cervical lamination    . Cystoscopy    . Nasal sinus surgery    . Gyn surgery    . Hysteroscopy with ablation    . Breast enhancement surgery  2010  . Nasal sinus surgery      x5  . Ercp  05/22/2012    Procedure: ENDOSCOPIC RETROGRADE CHOLANGIOPANCREATOGRAPHY (ERCP);  Surgeon: Meryl Dare, MD,FACG;  Location: [facility];  Service: Endoscopy;  Laterality: N/A;  . Cesarean section    . Hernia repair      inguinal  . Cholecystectomy  06/11/2012    Procedure: LAPAROSCOPIC CHOLECYSTECTOMY WITH INTRAOPERATIVE CHOLANGIOGRAM;  Surgeon: Atilano Ina, MD,FACS;  Location: [facility];  Service: General;  Laterality: N/A;    Family History  Problem Relation Age of Onset  . Heart disease Father   . Esophageal cancer Father   . Scleroderma Mother   . Cancer Mother     esophageal & stomach  . Cancer Paternal Grandfather     esophageal & stomach    Allergies  Allergen Reactions  . Codeine Phosphate Nausea And Vomiting  Severe n&v  . Cymbalta (Duloxetine Hcl)     Urinary retention  . Inh (Isoniazid)     Hepatitis  . Meperidine Hcl Rash    REACTION: red skin  . Sulfamethoxazole Rash    Current Outpatient Prescriptions on File Prior to Visit  Medication Sig Dispense Refill  . acyclovir (ZOVIRAX) 800 MG tablet Take one-half (1/2) tablet  daily  45 tablet  1  . ALPRAZolam (XANAX) 1 MG tablet Take 1 mg by mouth 3 (three) times daily as needed.        . AMITIZA 24 MCG capsule TAKE ONE CAPSULE BY MOUTH ONCE DAILY WITH BREAKFAST  30 each  6  . amLODipine (NORVASC) 5 MG tablet Take 5 mg by mouth daily.        . ARIPiprazole (ABILIFY) 10 MG tablet Take 10 mg by mouth at bedtime.       Marland Kitchen aspirin 81 MG tablet Take 81  mg by mouth daily.        . baclofen (LIORESAL) 10 MG tablet TAKE 1 TABLET THREE TIMES A DAY  270 tablet  2  . Calcium Carbonate-Vitamin D (CALCIUM-VITAMIN D) 500-200 MG-UNIT per tablet Take 1 tablet by mouth 3 (three) times daily with meals.        . Cholecalciferol (D 400 PO) Take by mouth.        . cyanocobalamin (,VITAMIN B-12,) 1000 MCG/ML injection Inject 1,000 mcg into the muscle every 21 ( twenty-one) days.      . Eszopiclone (ESZOPICLONE) 3 MG TABS Take 1 tablet (3 mg total) by mouth at bedtime. Take immediately before bedtime  30 tablet  3  . etodolac (LODINE XL) 400 MG 24 hr tablet Take 400 mg by mouth 2 (two) times daily.       . hydrocortisone (ANUSOL-HC) 25 MG suppository Place 1 suppository (25 mg total) rectally 2 (two) times daily.  12 suppository  5  . lamoTRIgine (LAMICTAL) 150 MG tablet Take 150 mg by mouth 2 (two) times daily.        Marland Kitchen lidocaine (LIDODERM) 5 % Place 3 patches onto the skin daily. Remove & Discard patch within 12 hours or as directed by MD       . Manganese 50 MG TABS Take 1 tablet by mouth daily.      Marland Kitchen morphine (AVINZA) 90 MG 24 hr capsule Take 1 capsule (90 mg total) by mouth daily.  90 capsule  0  . morphine (MSIR) 15 MG tablet Take 1 tablet by mouth as needed.      . ondansetron (ZOFRAN) 4 MG tablet Take 1 tablet (4 mg total) by mouth every 8 (eight) hours as needed.  30 tablet  6  . OVER THE COUNTER MEDICATION Take 1,000 mg by mouth 3 (three) times daily. Calcium Plain OTC      . pantoprazole (PROTONIX) 40 MG tablet Take 1 tablet daily  90 tablet  3  . pentosan polysulfate (ELMIRON) 100 MG capsule Take 100 mg by mouth. 2 by mouth twice a day        . pregabalin (LYRICA) 150 MG capsule Take 150 mg by mouth 3 (three) times daily.        . Triamcinolone Acetonide (NASACORT AQ NA) 2 sprays by Nasal route daily.        Marland Kitchen VAGIFEM 10 MCG TABS Place 1 tablet vaginally 2 (two) times a week.      . [DISCONTINUED] [facility] SLOW RELEASE IRON 143 (45 FE) MG TBCR  TAKE 1  TABLET EVERY DAY  30 tablet  0   No current facility-administered medications on file prior to visit.    BP 124/80  Pulse 72  Temp(Src) 98.3 F (36.8 C)  Resp 16  Ht 5\' 6"  (1.676 m)  Wt 142 lb (64.411 kg)  BMI 22.93 kg/m2       Objective:   Physical Exam  Constitutional: She appears well-developed and well-nourished. No distress.  HENT:  Head: Normocephalic and atraumatic.  Eyes: Conjunctivae and EOM are normal. Pupils are equal, round, and reactive to light.  Neck: Normal range of motion. Neck supple. No JVD present. No tracheal deviation present. No thyromegaly present.  Cardiovascular: Normal rate, regular rhythm, normal heart sounds and intact distal pulses.   No murmur heard. Pulmonary/Chest: Effort normal and breath sounds normal. She has no wheezes. She exhibits no tenderness.  Abdominal: Soft. Bowel sounds are normal.  Musculoskeletal: She exhibits no edema and no tenderness.  Lymphadenopathy:    She has no cervical adenopathy.  Neurological: She has normal reflexes. No cranial nerve deficit.  Skin: She is not diaphoretic.  Slight jaundice  Psychiatric: She has a normal mood and affect. Her behavior is normal.          Assessment & Plan:  LFT's  Check and if noi improved will US liver to make sure no duct stone seen Embrel may not be possible Increased GERD after eating? Could that be related to the elevated LFT's... No change with stopping the INH

## 2012-09-17 ENCOUNTER — Encounter: Payer: Self-pay | Admitting: Internal Medicine

## 2012-09-17 ENCOUNTER — Telehealth: Payer: Self-pay | Admitting: Internal Medicine

## 2012-09-17 NOTE — Telephone Encounter (Signed)
Patient Information:  Caller Name: [name]  Phone: [phone]  Patient: Rachel Duran, Rachel Duran  Gender: Female  DOB: 1955-10-23  Age: 57 Years  PCP: Darryll Capers (Adults only)  Pregnant: No  Office Follow Up:  Does the office need to follow up with this patient?: Yes  Instructions For The Office: Patient states that Dr Lovell Sheehan has been [facility]  [facility] will be faxing over to Dr Lovell Sheehan 2 weeks of her LFT's as they remain elevated.  Dr Roxan Hockey at the [facility] will be contacting Dr Lovell Sheehan to discuss the patient's care.  Dr Lovell Sheehan had also mentioned doing an abdominal ultrasound and patient is wondering when that might be scheduled.  She also states that the Dexilant for reflux started recently is beginnnig to help.  RN Note:  Patient verbalized understanding of care advice.  Symptoms  Reason For Call & Symptoms: Nausea, some abd pain and cause unknown. Dexilant for reflux started.  Reviewed Health History In EMR: Yes  Reviewed Medications In EMR: Yes  Reviewed Allergies In EMR: Yes  Reviewed Surgeries / Procedures: Yes  Date of Onset of Symptoms: 08/27/2012 OB / GYN:  LMP: Unknown  Guideline(s) Used:  Abdominal Pain - Female  Abdominal Pain - Upper  Disposition Per Guideline:   Home Care  Reason For Disposition Reached:   Mild abdominal pain  Advice Given:  Reassurance:  A mild stomachache can be caused by indigestion, gas pains or overeating. Sometimes a stomachache signals the onset of a vomiting illness due to a viral gastroenteritis ("stomach flu").  Rest:  Lie down and rest until you feel better.  Fluids:  Sip clear fluids only (e.g., water, flat soft drinks or 1/2 strength fruit juice) until the pain has been gone for over 2 hours. Then slowly return to a regular diet.  Diet:  Slowly advance diet from clear liquids to a bland diet  Avoid alcohol or caffeinated beverages  Avoid greasy or fatty foods.  Avoid  NSAIDS and Aspirin  : Avoid any drug that can irritate the stomach lining and make the pain worse (especially aspirin and NSAIDs like ibuprofen).  Call Back If:  Abdominal pain is constant and present for more than 2 hours  Abdominal pains come and go and are present for more than 24 hours  You become worse.  Call Back If:  Abdominal pain is constant and present for more than 2 hours  Abdominal pains come and go and are present for more than 24 hours  You become worse.

## 2012-09-18 ENCOUNTER — Other Ambulatory Visit: Payer: Self-pay | Admitting: *Deleted

## 2012-09-18 DIAGNOSIS — R7989 Other specified abnormal findings of blood chemistry: Secondary | ICD-10-CM

## 2012-09-18 NOTE — Telephone Encounter (Signed)
Dr Lovell Sheehan talked with dr Roxan Hockey at [facility] department and dr Bennie Hind abd [place].

## 2012-09-24 ENCOUNTER — Ambulatory Visit
Admission: RE | Admit: 2012-09-24 | Discharge: 2012-09-24 | Disposition: A | Discharge status: Home / Self Care | Payer: 59 | Source: Ambulatory Visit | Attending: Internal Medicine | Admitting: Internal Medicine

## 2012-09-24 ENCOUNTER — Telehealth: Payer: Self-pay | Admitting: Internal Medicine

## 2012-09-24 DIAGNOSIS — F988 Other specified behavioral and emotional disorders with onset usually occurring in childhood and adolescence: Secondary | ICD-10-CM

## 2012-09-24 DIAGNOSIS — R7989 Other specified abnormal findings of blood chemistry: Secondary | ICD-10-CM

## 2012-09-24 MED ORDER — AMPHETAMINE-DEXTROAMPHET ER 30 MG PO CP24: 30.0000 mg | ORAL_CAPSULE | ORAL | Status: DC

## 2012-09-24 NOTE — Telephone Encounter (Signed)
Printed-will call pt when dr Lovell Sheehan and is ready for pick up

## 2012-09-24 NOTE — Telephone Encounter (Signed)
Pt needs new rx adderall 30 mg xr #90. Pt would like to pick up rx today

## 2012-10-02 ENCOUNTER — Encounter: Payer: Self-pay | Admitting: Internal Medicine

## 2012-10-02 ENCOUNTER — Encounter: Payer: Self-pay | Admitting: *Deleted

## 2012-10-02 ENCOUNTER — Telehealth: Payer: Self-pay | Admitting: Internal Medicine

## 2012-10-02 NOTE — Telephone Encounter (Signed)
Patient called stating she would like a call back with [place] results. Please assist.

## 2012-10-02 NOTE — Telephone Encounter (Signed)
Gave info which appeared stable,but I will have dr Lovell Sheehan look and call back if any changes-- per pt, what is the next step?  Also ,plaquinel was stopped in September with elevated lft, and she never started back,because her lfts are not normal yet--now she has opthalmologist tell her she has something wrong with eyes, and they see it in people who havr taken plaquinil for a long period of time-please advise

## 2012-10-03 NOTE — Telephone Encounter (Signed)
Monitor the liver function  Repeat this week when they normalize may resume the plaquinil

## 2012-10-03 NOTE — Telephone Encounter (Signed)
Talked with pt and they are continuing lft q week [place]

## 2012-10-04 ENCOUNTER — Encounter: Payer: Self-pay | Admitting: Internal Medicine

## 2012-10-04 ENCOUNTER — Other Ambulatory Visit: Payer: Self-pay | Admitting: *Deleted

## 2012-10-04 MED ORDER — DEXLANSOPRAZOLE 60 MG PO CPDR: 60.0000 mg | DELAYED_RELEASE_CAPSULE | Freq: Every day | ORAL | Status: DC

## 2012-10-12 ENCOUNTER — Ambulatory Visit
Admission: RE | Admit: 2012-10-12 | Discharge: 2012-10-12 | Disposition: A | Discharge status: Home / Self Care | Payer: 59 | Source: Ambulatory Visit | Attending: Obstetrics and Gynecology | Admitting: Obstetrics and Gynecology

## 2012-10-12 DIAGNOSIS — Z1231 Encounter for screening mammogram for malignant neoplasm of breast: Secondary | ICD-10-CM

## 2012-10-12 LAB — HM MAMMOGRAPHY

## 2012-10-18 ENCOUNTER — Telehealth: Payer: Self-pay | Admitting: Internal Medicine

## 2012-10-18 NOTE — Telephone Encounter (Signed)
Pt called to request a refill of her morphine (AVINZA) 90 MG 24 hr capsule. She stated that it should be a 3 month supply. Please assist.

## 2012-10-19 ENCOUNTER — Encounter: Payer: Self-pay | Admitting: Obstetrics and Gynecology

## 2012-10-19 ENCOUNTER — Other Ambulatory Visit: Payer: Self-pay | Admitting: *Deleted

## 2012-10-19 ENCOUNTER — Ambulatory Visit (INDEPENDENT_AMBULATORY_CARE_PROVIDER_SITE_OTHER): Payer: 59 | Admitting: Obstetrics and Gynecology

## 2012-10-19 VITALS — BP 100/62 | Ht 65.0 in | Wt 143.0 lb

## 2012-10-19 DIAGNOSIS — N952 Postmenopausal atrophic vaginitis: Secondary | ICD-10-CM

## 2012-10-19 DIAGNOSIS — Z0000 Encounter for general adult medical examination without abnormal findings: Secondary | ICD-10-CM

## 2012-10-19 DIAGNOSIS — D649 Anemia, unspecified: Secondary | ICD-10-CM

## 2012-10-19 DIAGNOSIS — Z01419 Encounter for gynecological examination (general) (routine) without abnormal findings: Secondary | ICD-10-CM

## 2012-10-19 LAB — POCT URINALYSIS DIPSTICK
Blood, UA: NEGATIVE
Glucose, UA: NEGATIVE
Nitrite, UA: NEGATIVE
Urobilinogen, UA: 2
pH, UA: 7

## 2012-10-19 MED ORDER — MORPHINE SULFATE ER BEADS 90 MG PO CP24: 90.0000 mg | ORAL_CAPSULE | Freq: Every day | ORAL | Status: DC

## 2012-10-19 MED ORDER — ESTRADIOL 10 MCG VA TABS: 1.0000 | ORAL_TABLET | VAGINAL | Status: DC

## 2012-10-19 NOTE — Progress Notes (Signed)
Patient ID: Rachel Duran, female   DOB: [birth year], 57 y.o.   MRN: [id number]  57 y.o. MarriedCaucasian female   [phone] here for annual exam.    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  Did INH therapy for three months which caused malaise and had elevated liver function studies so stopped after three months.  Had cholecystectomy as well for benign disease.  Patient being tested weekly and followed by the [facility] - Dr. Roxan Hockey.  Patient will not apparently be doing any further treatment for the positive TB test.  Anemia on chart review.  Patient is off hormonal treatment with the exception of Vagifem. Using it twice a week.  Not working well.  Difficulty reaching orgasm.  Patient reporting chronic muscle weakness, constipation, and urinary incontinence.  Miralax works well.Urinary incontinence occurs with coughing, exercise.  Not significant.  Has worked with Ella Jubilee for interstitial cystitis but not incontinence.  Patient's last menstrual period was 07/25/1998.          Sexually active: yes  The current method of family planning is vasectomy.    Last mammogram:  January 2014 - has implants.  Normal mammogram. SBE - does exams. Last pap smear:  2013 Normal.  Negative HR HPV. History of abnormal pap:  Never. Exercise:  Yoga, stretching, walking. Smoking: No. Alcohol:  No. Last colonoscopy:  5 - 7 years ago.  Benign polyps noted.  Dr. Russella Dar. Last Bone Density:  Several years ago had a heel scan.   Last tetanus shot:  09/2005 Last cholesterol check:   Hgb:                Urine:  Negative.    Health Maintenance  Topic Date Due  . Pap Smear  01/23/1976  . Influenza Vaccine  03/25/2013  . Mammogram  10/13/2014  . Tetanus/tdap  11/02/2015  . Colonoscopy  11/19/2016    Family History  Problem Relation Age of Onset  . Heart disease Father   . Esophageal cancer Father   . Cancer Father   . Scleroderma Mother   .  Hypertension Mother   . Cancer Paternal Grandfather     esophageal & stomach  . Diabetes Maternal Grandmother     Patient Active Problem List  Diagnosis  . PORPHYRIA CUTANEA TARDA  . ANEMIA OF CHRONIC DISEASE  . INSOMNIA, CHRONIC  . DEPRESSION  . DISORDERS, ORGNC INSOMNIA D/T MENTAL DISORDER  . [name] SYNDROME  . ALLERGIC RHINITIS  . APHTHOUS ULCERS  . GERD  . CONSTIPATION, DRUG INDUCED  . CYSTITIS, CHRONIC INTERSTITIAL  . ROSACEA  . OSTEOARTHRITIS  . NECK PAIN, CHRONIC  . CERVICAL RADICULOPATHY  . UNSPECIFIED SYNOVITIS AND TENOSYNOVITIS  . FIBROMYALGIA, SEVERE  . NEURALGIA  . CRAMP IN LIMB  . SYMPTOM, MALAISE AND FATIGUE NEC  . ABNORMAL WEIGHT GAIN  . WEIGHT LOSS, ABNORMAL  . CHEST PAIN UNSPECIFIED  . Varicose veins of lower extremities with other complications  . Positive QuantiFERON-TB Gold test  . Nonspecific (abnormal) findings on radiological and other examination of biliary tract  . Abdominal pain, right upper quadrant  . Nonspecific elevation of levels of transaminase or lactic acid dehydrogenase (LDH)    Past Medical History  Diagnosis Date  . Disorders of porphyrin metabolism   . Irritable bowel syndrome   . Fibromyalgia   . Arthritis   . GERD (gastroesophageal reflux disease)   . Depression   . Allergy   .  Chronic pain   . Cholecystitis   . Internal hemorrhoids   . PONV (postoperative nausea and vomiting)   . Interstitial cystitis 06-06-12    hx.  . Raynauds disease     hx.  . Cervical disc disease     hx. C6- C7 -hx. past fusion(bone graft used)  . Anemia     Past Surgical History  Procedure Laterality Date  . Cervical fusion    . Cervical lamination    . Cystoscopy    . Nasal sinus surgery    . Gyn surgery    . Hysteroscopy with ablation    . Breast enhancement surgery  2010  . Nasal sinus surgery      x5  . Ercp  05/22/2012    Procedure: ENDOSCOPIC RETROGRADE CHOLANGIOPANCREATOGRAPHY (ERCP);  Surgeon: Meryl Dare, MD,FACG;   Location: [facility];  Service: Endoscopy;  Laterality: N/A;  . Cesarean section    . Hernia repair      inguinal  . Cholecystectomy  06/11/2012    Procedure: LAPAROSCOPIC CHOLECYSTECTOMY WITH INTRAOPERATIVE CHOLANGIOGRAM;  Surgeon: Atilano Ina, MD,FACS;  Location: [facility];  Service: General;  Laterality: N/A;    Allergies: Codeine phosphate; Cymbalta; Inh; Meperidine hcl; and Sulfamethoxazole  Current Outpatient Prescriptions  Medication Sig Dispense Refill  . acyclovir (ZOVIRAX) 800 MG tablet Take one-half (1/2) tablet  daily  45 tablet  1  . ALPRAZolam (XANAX) 1 MG tablet Take 1 mg by mouth 3 (three) times daily as needed.        . AMITIZA 24 MCG capsule TAKE ONE CAPSULE BY MOUTH ONCE DAILY WITH BREAKFAST  30 each  6  . amLODipine (NORVASC) 5 MG tablet Take 5 mg by mouth daily.        Marland Kitchen amphetamine-dextroamphetamine (ADDERALL XR) 30 MG 24 hr capsule Take 1 capsule (30 mg total) by mouth every morning.  90 capsule  0  . ARIPiprazole (ABILIFY) 10 MG tablet Take 10 mg by mouth at bedtime.       Marland Kitchen aspirin 81 MG tablet Take 81 mg by mouth daily.        . baclofen (LIORESAL) 10 MG tablet TAKE 1 TABLET THREE TIMES A DAY  270 tablet  2  . Calcium Carbonate-Vitamin D (CALCIUM-VITAMIN D) 500-200 MG-UNIT per tablet Take 1 tablet by mouth 3 (three) times daily with meals.        . Cholecalciferol (D 400 PO) Take by mouth.        . co-enzyme Q-10 50 MG capsule Take 50 mg by mouth daily.      . cyanocobalamin (,VITAMIN B-12,) 1000 MCG/ML injection Inject 1,000 mcg into the muscle every 21 ( twenty-one) days.      Marland Kitchen dexlansoprazole (DEXILANT) 60 MG capsule Take 1 capsule (60 mg total) by mouth daily.  90 capsule  3  . diclofenac sodium (VOLTAREN) 1 % GEL Apply 1 g topically as needed.      . Eszopiclone (ESZOPICLONE) 3 MG TABS Take 1 tablet (3 mg total) by mouth at bedtime. Take immediately before bedtime  30 tablet  3  . lamoTRIgine (LAMICTAL) 150 MG tablet Take 150 mg by mouth 2 (two) times  daily.        Marland Kitchen lidocaine (LIDODERM) 5 % Place 3 patches onto the skin daily. Remove & Discard patch within 12 hours or as directed by MD       . Manganese 50 MG TABS Take 1 tablet by mouth daily.      Marland Kitchen  morphine (AVINZA) 90 MG 24 hr capsule Take 1 capsule (90 mg total) by mouth daily.  90 capsule  0  . morphine (MSIR) 15 MG tablet Take 1 tablet by mouth as needed.      . ondansetron (ZOFRAN) 4 MG tablet Take 1 tablet (4 mg total) by mouth every 8 (eight) hours as needed.  30 tablet  6  . OVER THE COUNTER MEDICATION Take 1,000 mg by mouth 3 (three) times daily. Calcium Plain OTC      . pentosan polysulfate (ELMIRON) 100 MG capsule Take 100 mg by mouth. 2 by mouth twice a day        . Polyethylene Glycol 3350 (MIRALAX PO) Take 1 scoop by mouth. Take 1scoop 3 to 5 times daily      . pregabalin (LYRICA) 150 MG capsule Take 150 mg by mouth 3 (three) times daily.        Marland Kitchen thiamine 100 MG tablet Take 100 mg by mouth daily.      . Triamcinolone Acetonide (NASACORT AQ NA) 2 sprays by Nasal route daily.        Marland Kitchen VAGIFEM 10 MCG TABS Place 1 tablet vaginally 2 (two) times a week.      . etodolac (LODINE XL) 400 MG 24 hr tablet Take 400 mg by mouth 2 (two) times daily.       . hydrocortisone (ANUSOL-HC) 25 MG suppository Place 1 suppository (25 mg total) rectally 2 (two) times daily.  12 suppository  5  . pantoprazole (PROTONIX) 40 MG tablet Take 1 tablet daily  90 tablet  3  . [DISCONTINUED] [facility] SLOW RELEASE IRON 143 (45 FE) MG TBCR TAKE 1 TABLET EVERY DAY  30 tablet  0   No current facility-administered medications for this visit.    ROS: Pertinent items are noted in HPI.  Exam:    BP 100/62  Ht 5\' 5"  (1.651 m)  Wt 143 lb (64.864 kg)  BMI 23.8 kg/m2  LMP 07/25/1998   Wt Readings from Last 3 Encounters:  10/19/12 143 lb (64.864 kg)  09/10/12 142 lb (64.411 kg)  07/05/12 142 lb (64.411 kg)     Ht Readings from Last 3 Encounters:  10/19/12 5\' 5"  (1.651 m)  09/10/12 5\' 6"  (1.676 m)  07/05/12  5\' 6"  (1.676 m)    General appearance: alert, cooperative and appears stated age Head: Normocephalic, without obvious abnormality, atraumatic Neck: no adenopathy, supple, symmetrical, trachea midline and thyroid not enlarged, symmetric, no tenderness/mass/nodules Lungs: clear to auscultation bilaterally Breasts: Inspection negative, No nipple retraction or dimpling, Bilateral implants. No nipple discharge or bleeding, No axillary or supraclavicular adenopathy, Normal to palpation without dominant masses Heart: regular rate and rhythm Abdomen: soft, non-tender; bowel sounds normal; no masses,  no organomegaly Extremities: extremities normal, atraumatic, no cyanosis or edema Skin: Skin color, texture, turgor normal. No rashes or lesions Lymph nodes: Cervical, supraclavicular, and axillary nodes normal. No abnormal inguinal nodes palpated Neurologic: Grossly normal   Pelvic: External genitalia:  no lesions              Urethra:  normal appearing urethra with no masses, tenderness or lesions              Bartholins and Skenes: normal                 Vagina: normal appearing vagina with normal color and discharge, no lesions              Cervix: normal appearance  Pap taken: no        Bimanual Exam:  Uterus:  uterus is normal size, shape, consistency and nontender                                      Adnexa: normal adnexa in size, nontender and no masses                                      Rectovaginal: Confirms                                      Anus:  normal sphincter tone, no lesions  A: well woman Atrophic vaginal change. Mild genuine stress incontinence. Anemia  P: mammogram yearly pap smear in 2018 Vagifem Rx.  See orders. Patient will also use over the counter Vitamin E suppositories three times a week, alternating with Vagifem two times a week. She will discuss with Dr. Roxan Hockey the possibility of return to hormone replacement therapy.  I would recommend a low  dose estrogen patch and low dose progesterone if patient returns to this therapy. Bone Density. return annually or prn     An After Visit Summary was printed and given to the patient.

## 2012-10-19 NOTE — Telephone Encounter (Signed)
Printed and will call pt when ready for pick up 

## 2012-10-19 NOTE — Addendum Note (Signed)
Addended by: Conley Simmonds on: 10/19/2012 03:25 PM   Modules accepted: Orders

## 2012-10-19 NOTE — Patient Instructions (Signed)

## 2012-10-20 LAB — LIPID PANEL
HDL: 63 mg/dL (ref >39)
LDL Cholesterol: 70 mg/dL (ref 0–99)
Triglycerides: 94 mg/dL (ref <150)
VLDL: 19 mg/dL (ref 0–40)

## 2012-10-20 LAB — IRON AND TIBC
%SAT: 12 % — ABNORMAL LOW (ref 20–55)
Iron: 40 ug/dL — ABNORMAL LOW (ref 42–145)
TIBC: 344 ug/dL (ref 250–470)

## 2012-10-22 ENCOUNTER — Other Ambulatory Visit: Payer: Self-pay | Admitting: Obstetrics and Gynecology

## 2012-10-22 ENCOUNTER — Encounter: Payer: Self-pay | Admitting: Obstetrics and Gynecology

## 2012-10-22 DIAGNOSIS — D649 Anemia, unspecified: Secondary | ICD-10-CM

## 2012-10-23 ENCOUNTER — Encounter: Payer: Self-pay | Admitting: *Deleted

## 2012-10-24 ENCOUNTER — Encounter: Payer: Self-pay | Admitting: *Deleted

## 2012-10-24 ENCOUNTER — Ambulatory Visit (INDEPENDENT_AMBULATORY_CARE_PROVIDER_SITE_OTHER): Payer: 59 | Admitting: *Deleted

## 2012-10-24 DIAGNOSIS — I781 Nevus, non-neoplastic: Secondary | ICD-10-CM

## 2012-10-24 NOTE — Progress Notes (Signed)
X=.3% Sotradecol administered with a 27g butterfly.  Patient received a total of 11cc.  Treated all areas of concern for this nice lady. Tol well. Anticipate good results. Will follow prn.  Photos: yes  Compression stockings applied: yes

## 2012-11-07 ENCOUNTER — Encounter: Payer: Self-pay | Admitting: Internal Medicine

## 2012-11-08 ENCOUNTER — Other Ambulatory Visit: Payer: Self-pay | Admitting: *Deleted

## 2012-11-12 ENCOUNTER — Other Ambulatory Visit: Payer: Self-pay | Admitting: *Deleted

## 2012-11-12 ENCOUNTER — Encounter: Payer: Self-pay | Admitting: *Deleted

## 2012-11-12 MED ORDER — HYDROCORTISONE ACE-PRAMOXINE 2.5-1 % RE CREA: TOPICAL_CREAM | Freq: Three times a day (TID) | RECTAL | Status: DC

## 2012-11-28 ENCOUNTER — Telehealth: Payer: Self-pay | Admitting: Internal Medicine

## 2012-11-28 DIAGNOSIS — F988 Other specified behavioral and emotional disorders with onset usually occurring in childhood and adolescence: Secondary | ICD-10-CM

## 2012-11-28 MED ORDER — MORPHINE SULFATE 15 MG PO TABS: 15.0000 mg | ORAL_TABLET | ORAL | Status: DC | PRN

## 2012-11-28 MED ORDER — AMPHETAMINE-DEXTROAMPHET ER 30 MG PO CP24: 30.0000 mg | ORAL_CAPSULE | ORAL | Status: DC

## 2012-11-28 NOTE — Telephone Encounter (Signed)
Pt needs refill of: amphetamine-dextroamphetamine (ADDERALL XR) 30 MG 24  90 day morphine (MSIR) 15 MG tablet  Pls call pt cell phone when ready.

## 2012-11-28 NOTE — Telephone Encounter (Signed)
Printed and will call pt to pick up after dr jenkins signs 

## 2012-12-14 ENCOUNTER — Telehealth: Payer: Self-pay | Admitting: Internal Medicine

## 2012-12-14 MED ORDER — CIPROFLOXACIN HCL 500 MG PO TABS: 500.0000 mg | ORAL_TABLET | Freq: Two times a day (BID) | ORAL | Status: DC

## 2012-12-14 NOTE — Telephone Encounter (Signed)
Pt woke up this am w/ an UTI. Pt asked if MD would call her in a RX for this to [facility], [address] [place], [place] Pt has an office visit scheduled next Fri, (5/30)

## 2012-12-14 NOTE — Telephone Encounter (Signed)
Per dr Lovell Sheehan- may have cipro 500 bid for 5 days- [facility] to [facility]

## 2012-12-18 ENCOUNTER — Other Ambulatory Visit (INDEPENDENT_AMBULATORY_CARE_PROVIDER_SITE_OTHER): Payer: 59

## 2012-12-18 DIAGNOSIS — D649 Anemia, unspecified: Secondary | ICD-10-CM

## 2012-12-18 LAB — CBC
MCHC: 33 g/dL (ref 30.0–36.0)
Platelets: 319 10*3/uL (ref 150–400)
RDW: 13 % (ref 11.5–15.5)
WBC: 8.9 10*3/uL (ref 4.0–10.5)

## 2012-12-18 LAB — IRON AND TIBC
TIBC: 340 ug/dL (ref 250–470)
UIBC: 210 ug/dL (ref 125–400)

## 2012-12-21 ENCOUNTER — Other Ambulatory Visit: Payer: Self-pay | Admitting: *Deleted

## 2012-12-21 ENCOUNTER — Ambulatory Visit (INDEPENDENT_AMBULATORY_CARE_PROVIDER_SITE_OTHER): Payer: 59 | Admitting: Internal Medicine

## 2012-12-21 ENCOUNTER — Encounter: Payer: Self-pay | Admitting: Internal Medicine

## 2012-12-21 VITALS — BP 130/80 | HR 76 | Temp 98.6°F | Resp 16 | Ht 65.0 in | Wt 149.0 lb

## 2012-12-21 DIAGNOSIS — R5383 Other fatigue: Secondary | ICD-10-CM

## 2012-12-21 DIAGNOSIS — R5381 Other malaise: Secondary | ICD-10-CM

## 2012-12-21 DIAGNOSIS — IMO0002 Reserved for concepts with insufficient information to code with codable children: Secondary | ICD-10-CM

## 2012-12-21 DIAGNOSIS — IMO0001 Reserved for inherently not codable concepts without codable children: Secondary | ICD-10-CM

## 2012-12-21 DIAGNOSIS — D638 Anemia in other chronic diseases classified elsewhere: Secondary | ICD-10-CM

## 2012-12-21 MED ORDER — ESTAZOLAM 2 MG PO TABS: 2.0000 mg | ORAL_TABLET | Freq: Every day | ORAL | Status: DC

## 2012-12-21 MED ORDER — LUBIPROSTONE 24 MCG PO CAPS: ORAL_CAPSULE | ORAL | Status: DC

## 2012-12-21 MED ORDER — MORPHINE SULFATE ER BEADS 120 MG PO CP24: 120.0000 mg | ORAL_CAPSULE | Freq: Every day | ORAL | Status: DC

## 2012-12-21 NOTE — Addendum Note (Signed)
Addended by: Stacie Glaze on: 12/21/2012 12:21 PM   Modules accepted: Orders

## 2012-12-21 NOTE — Patient Instructions (Signed)
The patient is instructed to continue all medications as prescribed. Schedule followup with check out clerk upon leaving the clinic  

## 2012-12-21 NOTE — Progress Notes (Signed)
Subjective:    Patient ID: Rachel Duran, female    DOB: [birth year], 57 y.o.   MRN: [id number]  HPI The patient could not tolerate the INH so she is not eligible for a biologic and has been placed on prednisone by her rheumatologist She has been on avinza for long term and has developed tolerance. Had some relief from flector patches in the past Cost of avinza now becoming prohibitive      Review of Systems  Constitutional: Negative for activity change, appetite change and fatigue.  HENT: Negative for ear pain, congestion, neck pain, postnasal drip and sinus pressure.   Eyes: Negative for redness and visual disturbance.  Respiratory: Negative for cough, shortness of breath and wheezing.   Gastrointestinal: Negative for abdominal pain and abdominal distention.  Genitourinary: Negative for dysuria, frequency and menstrual problem.  Musculoskeletal: Negative for myalgias, joint swelling and arthralgias.  Skin: Negative for rash and wound.  Neurological: Negative for dizziness, weakness and headaches.  Hematological: Negative for adenopathy. Does not bruise/bleed easily.  Psychiatric/Behavioral: Negative for sleep disturbance and decreased concentration.   Past Medical History  Diagnosis Date  . Disorders of porphyrin metabolism   . Irritable bowel syndrome   . Fibromyalgia   . Arthritis   . GERD (gastroesophageal reflux disease)   . Depression   . Allergy   . Chronic pain   . Cholecystitis   . Internal hemorrhoids   . PONV (postoperative nausea and vomiting)   . Interstitial cystitis 06-06-12    hx.  . Raynauds disease     hx.  . Cervical disc disease     hx. C6- C7 -hx. past fusion(bone graft used)  . Anemia     History   Social History  . Marital Status: Married    Spouse Name: N/A    Number of Children: N/A  . Years of Education: N/A   Occupational History  . retired    Social History Main Topics  . Smoking status: Never Smoker   . Smokeless tobacco:  Never Used  . Alcohol Use: No  . Drug Use: No  . Sexually Active: Yes   Other Topics Concern  . Not on file   Social History Narrative  . No narrative on file    Past Surgical History  Procedure Laterality Date  . Cervical fusion    . Cervical lamination    . Cystoscopy    . Nasal sinus surgery    . Gyn surgery    . Hysteroscopy with ablation    . Breast enhancement surgery  2010  . Nasal sinus surgery      x5  . Ercp  05/22/2012    Procedure: ENDOSCOPIC RETROGRADE CHOLANGIOPANCREATOGRAPHY (ERCP);  Surgeon: Meryl Dare, MD,FACG;  Location: [facility];  Service: Endoscopy;  Laterality: N/A;  . Cesarean section    . Hernia repair      inguinal  . Cholecystectomy  06/11/2012    Procedure: LAPAROSCOPIC CHOLECYSTECTOMY WITH INTRAOPERATIVE CHOLANGIOGRAM;  Surgeon: Atilano Ina, MD,FACS;  Location: [facility];  Service: General;  Laterality: N/A;    Family History  Problem Relation Age of Onset  . Heart disease Father   . Esophageal cancer Father   . Cancer Father   . Scleroderma Mother   . Hypertension Mother   . Cancer Paternal Grandfather     esophageal & stomach  . Diabetes Maternal Grandmother     Allergies  Allergen Reactions  . Codeine Phosphate Nausea And Vomiting  Severe n&v  . Cymbalta (Duloxetine Hcl)     Urinary retention  . Inh (Isoniazid)     Hepatitis  . Meperidine Hcl Rash    REACTION: red skin  . Sulfamethoxazole Rash    Current Outpatient Prescriptions on File Prior to Visit  Medication Sig Dispense Refill  . acyclovir (ZOVIRAX) 800 MG tablet Take one-half (1/2) tablet  daily  45 tablet  1  . ALPRAZolam (XANAX) 1 MG tablet Take 1 mg by mouth 3 (three) times daily as needed.        . AMITIZA 24 MCG capsule TAKE ONE CAPSULE BY MOUTH ONCE DAILY WITH BREAKFAST  30 each  6  . amLODipine (NORVASC) 5 MG tablet Take 5 mg by mouth daily.        Marland Kitchen amphetamine-dextroamphetamine (ADDERALL XR) 30 MG 24 hr capsule Take 1 capsule (30 mg total) by mouth  every morning.  90 capsule  0  . ARIPiprazole (ABILIFY) 10 MG tablet Take 10 mg by mouth at bedtime.       Marland Kitchen aspirin 81 MG tablet Take 81 mg by mouth daily.        . baclofen (LIORESAL) 10 MG tablet TAKE 1 TABLET THREE TIMES A DAY  270 tablet  2  . Calcium Carbonate-Vitamin D (CALCIUM-VITAMIN D) 500-200 MG-UNIT per tablet Take 1 tablet by mouth 3 (three) times daily with meals.        . Cholecalciferol (D 400 PO) Take by mouth.        . co-enzyme Q-10 50 MG capsule Take 50 mg by mouth daily.      . cyanocobalamin (,VITAMIN B-12,) 1000 MCG/ML injection Inject 1,000 mcg into the muscle every 21 ( twenty-one) days.      Marland Kitchen dexlansoprazole (DEXILANT) 60 MG capsule Take 1 capsule (60 mg total) by mouth daily.  90 capsule  3  . diclofenac sodium (VOLTAREN) 1 % GEL Apply 1 g topically as needed.      . Estradiol (VAGIFEM) 10 MCG TABS Place 1 tablet (10 mcg total) vaginally 2 (two) times a week.  24 tablet  3  . Eszopiclone (ESZOPICLONE) 3 MG TABS Take 1 tablet (3 mg total) by mouth at bedtime. Take immediately before bedtime  30 tablet  3  . etodolac (LODINE XL) 400 MG 24 hr tablet Take 400 mg by mouth 2 (two) times daily.       . hydrocortisone (ANUSOL-HC) 25 MG suppository Place 1 suppository (25 mg total) rectally 2 (two) times daily.  12 suppository  5  . hydrocortisone-pramoxine (ANALPRAM-HC) 2.5-1 % rectal cream Place rectally 3 (three) times daily.  30 g  3  . lamoTRIgine (LAMICTAL) 150 MG tablet Take 150 mg by mouth 2 (two) times daily.        Marland Kitchen lidocaine (LIDODERM) 5 % Place 3 patches onto the skin daily. Remove & Discard patch within 12 hours or as directed by MD       . Manganese 50 MG TABS Take 1 tablet by mouth daily.      Marland Kitchen morphine (AVINZA) 90 MG 24 hr capsule Take 1 capsule (90 mg total) by mouth daily.  90 capsule  0  . morphine (MSIR) 15 MG tablet Take 1 tablet (15 mg total) by mouth as needed.  30 tablet  0  . ondansetron (ZOFRAN) 4 MG tablet Take 1 tablet (4 mg total) by mouth every 8  (eight) hours as needed.  30 tablet  6  . OVER THE COUNTER MEDICATION  Take 1,000 mg by mouth 3 (three) times daily. Calcium Plain OTC      . pentosan polysulfate (ELMIRON) 100 MG capsule Take 100 mg by mouth. 2 by mouth twice a day        . Polyethylene Glycol 3350 (MIRALAX PO) Take 1 scoop by mouth. Take 1scoop 3 to 5 times daily      . pregabalin (LYRICA) 150 MG capsule Take 150 mg by mouth 3 (three) times daily.        Marland Kitchen thiamine 100 MG tablet Take 100 mg by mouth daily.      . Triamcinolone Acetonide (NASACORT AQ NA) 2 sprays by Nasal route daily.        . [DISCONTINUED] [facility] SLOW RELEASE IRON 143 (45 FE) MG TBCR TAKE 1 TABLET EVERY DAY  30 tablet  0   No current facility-administered medications on file prior to visit.    BP 130/80  Pulse 76  Temp(Src) 98.6 F (37 C)  Resp 16  Ht 5\' 5"  (1.651 m)  Wt 149 lb (67.586 kg)  BMI 24.79 kg/m2  LMP 07/25/1998       Objective:   Physical Exam  Constitutional: She is oriented to person, place, and time. She appears well-developed and well-nourished. No distress.  HENT:  Head: Normocephalic and atraumatic.  Eyes: Conjunctivae and EOM are normal. Pupils are equal, round, and reactive to light.  Neck: Normal range of motion. Neck supple. No JVD present. No tracheal deviation present. No thyromegaly present.  Cardiovascular: Normal rate and regular rhythm.   No murmur heard. Pulmonary/Chest: Effort normal and breath sounds normal. She has no wheezes. She exhibits no tenderness.  Abdominal: Soft. Bowel sounds are normal.  Musculoskeletal: She exhibits edema and tenderness.  Marked point tenderness   Lymphadenopathy:    She has no cervical adenopathy.  Neurological: She is alert and oriented to person, place, and time. She has normal reflexes. No cranial nerve deficit.  Skin: Skin is warm and dry. She is not diaphoretic.  Psychiatric: She has a normal mood and affect. Her behavior is normal.          Assessment & Plan:   Prohibitive to use a biologic due to TB Question is the use of daily low dose medication to suppress possible TB and use a biologic? Fibromyalgia pain is severe Will boast the avinza increase the amitiza for the possible side effect of constipation

## 2012-12-23 ENCOUNTER — Encounter: Payer: Self-pay | Admitting: Internal Medicine

## 2013-01-02 ENCOUNTER — Encounter: Payer: Self-pay | Admitting: Internal Medicine

## 2013-01-07 ENCOUNTER — Other Ambulatory Visit: Payer: Self-pay | Admitting: *Deleted

## 2013-01-07 MED ORDER — ESZOPICLONE 3 MG PO TABS: 3.0000 mg | ORAL_TABLET | Freq: Every day | ORAL | Status: DC

## 2013-01-08 ENCOUNTER — Telehealth: Payer: Self-pay | Admitting: Gastroenterology

## 2013-01-08 NOTE — Telephone Encounter (Signed)
Patient being sent here for hemorrhoids and is interested in banding.  She reports that the hemorrhoids are definitely external.  She has been using creams and suppositories with no real improvement.  Dr. Lovell Sheehan instructed her to contact us in a My chart message.  Do we have anything to offer for external hemorrhoids that she is not already doing.  She does not want to waste your time.  Please advise

## 2013-01-08 NOTE — Telephone Encounter (Signed)
If she has bleeding we will need a colonoscopy as the last one listed in [facility] was in 2006. If just for swelling, pain, or other symptom surgical referral for external hemorrhoids is appropriate. She saw Gaynelle Adu, MD with [facility] for lap chole last year so could return to see him or one of his partners.

## 2013-01-08 NOTE — Telephone Encounter (Signed)
Patient reports that she rarely sees bleeding, and has not had any for a " long time".  She wants to set up a surgical appointment.  She will call back for any additional questions or concerns

## 2013-01-17 ENCOUNTER — Encounter: Payer: Self-pay | Admitting: Internal Medicine

## 2013-01-18 ENCOUNTER — Ambulatory Visit: Payer: Medicare Other | Admitting: Family

## 2013-01-18 ENCOUNTER — Other Ambulatory Visit: Payer: Self-pay | Admitting: Internal Medicine

## 2013-01-18 NOTE — Telephone Encounter (Signed)
I cancelled your appointment at 9:45- give [place] a call and they should be able to find you an appointment

## 2013-01-21 ENCOUNTER — Encounter: Payer: Self-pay | Admitting: Family

## 2013-01-21 ENCOUNTER — Ambulatory Visit (INDEPENDENT_AMBULATORY_CARE_PROVIDER_SITE_OTHER): Payer: Medicare Other | Admitting: Family

## 2013-01-21 VITALS — BP 100/70 | HR 115 | Wt 152.0 lb

## 2013-01-21 DIAGNOSIS — R609 Edema, unspecified: Secondary | ICD-10-CM

## 2013-01-21 DIAGNOSIS — IMO0001 Reserved for inherently not codable concepts without codable children: Secondary | ICD-10-CM

## 2013-01-21 DIAGNOSIS — R350 Frequency of micturition: Secondary | ICD-10-CM

## 2013-01-21 LAB — POCT URINALYSIS DIPSTICK
Ketones, UA: NEGATIVE
Spec Grav, UA: 1.015
Urobilinogen, UA: 0.2

## 2013-01-21 LAB — COMPREHENSIVE METABOLIC PANEL
ALT: 23 U/L (ref 0–35)
Albumin: 4.2 g/dL (ref 3.5–5.2)
CO2: 32 mEq/L (ref 19–32)
Chloride: 101 mEq/L (ref 96–112)
GFR: 98.83 mL/min (ref >60.00)
Potassium: 4.4 mEq/L (ref 3.5–5.1)
Sodium: 139 mEq/L (ref 135–145)
Total Bilirubin: 0.4 mg/dL (ref 0.3–1.2)
Total Protein: 6.8 g/dL (ref 6.0–8.3)

## 2013-01-21 MED ORDER — NITROFURANTOIN MONOHYD MACRO 100 MG PO CAPS: 100.0000 mg | ORAL_CAPSULE | Freq: Two times a day (BID) | ORAL | Status: DC

## 2013-01-21 MED ORDER — MORPHINE SULFATE ER BEADS 120 MG PO CP24: 120.0000 mg | ORAL_CAPSULE | Freq: Every day | ORAL | Status: DC

## 2013-01-21 NOTE — Progress Notes (Signed)
Subjective:    Patient ID: Rachel Duran, female    DOB: 11/16/1955, 56 y.o.   MRN: [id number]  HPI 58 year old white female, nonsmoker, patient of Dr. Lovell Sheehan is in today with complaints of peripheral edema that's been ongoing x1 year but worsening over the last 3 weeks. She has a history of synovitis of the right ankle and had been taken methotrexate and Plaquenil. 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 The swelling is in both feet bilaterally. Denies any increase in sodium intake. Reports urinating normally with regards to volume. However, has noticed more frequency and urgency to urinate. She has a history of interstitial cystitis, therefore is unsure if it's related to her current urinary symptoms. She describes the discomfort in her feet aching and throbbing, stiff in the mornings. Not currently taken anything outside of prescription medications.   Review of Systems  Constitutional: Negative.   HENT: Negative.   Respiratory: Negative.   Cardiovascular: Positive for leg swelling. Negative for chest pain and palpitations.  Gastrointestinal: Negative.   Genitourinary: Negative.   Musculoskeletal: Positive for myalgias and arthralgias.       Muscle and joint pain  Skin: Negative.   Neurological: Negative.   Psychiatric/Behavioral: Negative.    Past Medical History  Diagnosis Date  . Disorders of porphyrin metabolism   . Irritable bowel syndrome   . Fibromyalgia   . Arthritis   . GERD (gastroesophageal reflux disease)   . Depression   . Allergy   . Chronic pain   . Cholecystitis   . Internal hemorrhoids   . PONV (postoperative nausea and vomiting)   . Interstitial cystitis 06-06-12    hx.  . Raynauds disease     hx.  . Cervical disc disease     hx. C6- C7 -hx. past fusion(bone graft used)  . Anemia     History   Social History  . Marital Status: Married   Spouse Name: N/A    Number of Children: N/A  . Years of Education: N/A   Occupational History  . retired    Social History Main Topics  . Smoking status: Never Smoker   . Smokeless tobacco: Never Used  . Alcohol Use: No  . Drug Use: No  . Sexually Active: Yes   Other Topics Concern  . Not on file   Social History Narrative  . No narrative on file    Past Surgical History  Procedure Laterality Date  . Cervical fusion    . Cervical lamination    . Cystoscopy    . Nasal sinus surgery    . Gyn surgery    . Hysteroscopy with ablation    . Breast enhancement surgery  2010  . Nasal sinus surgery      x5  . Ercp  05/22/2012    Procedure: ENDOSCOPIC RETROGRADE CHOLANGIOPANCREATOGRAPHY (ERCP);  Surgeon: Meryl Dare, MD,FACG;  Location: [facility];  Service: Endoscopy;  Laterality: N/A;  . Cesarean section    . Hernia repair      inguinal  . Cholecystectomy  06/11/2012    Procedure: LAPAROSCOPIC CHOLECYSTECTOMY WITH INTRAOPERATIVE CHOLANGIOGRAM;  Surgeon: Atilano Ina, MD,FACS;  Location: [facility];  Service: General;  Laterality: N/A;    Family History  Problem Relation Age of Onset  . Heart disease Father   . Esophageal cancer Father   . Cancer Father   . Scleroderma Mother   .  Hypertension Mother   . Cancer Paternal Grandfather     esophageal & stomach  . Diabetes Maternal Grandmother     Allergies  Allergen Reactions  . Codeine Phosphate Nausea And Vomiting    Severe n&v  . Cymbalta (Duloxetine Hcl)     Urinary retention  . Inh (Isoniazid)     Hepatitis  . Meperidine Hcl Rash    REACTION: red skin  . Sulfamethoxazole Rash    Current Outpatient Prescriptions on File Prior to Visit  Medication Sig Dispense Refill  . acyclovir (ZOVIRAX) 800 MG tablet Take one-half (1/2) tablet  daily  45 tablet  1  . ALPRAZolam (XANAX) 1 MG tablet Take 1 mg by mouth 3 (three) times daily as needed.        Marland Kitchen amLODipine (NORVASC) 5 MG tablet Take 5 mg by mouth daily.         Marland Kitchen amphetamine-dextroamphetamine (ADDERALL XR) 30 MG 24 hr capsule Take 1 capsule (30 mg total) by mouth every morning.  90 capsule  0  . ARIPiprazole (ABILIFY) 10 MG tablet Take 10 mg by mouth at bedtime.       Marland Kitchen aspirin 81 MG tablet Take 81 mg by mouth daily.        . baclofen (LIORESAL) 10 MG tablet TAKE 1 TABLET THREE TIMES A DAY  270 tablet  2  . Calcium Carbonate-Vitamin D (CALCIUM-VITAMIN D) 500-200 MG-UNIT per tablet Take 1 tablet by mouth 3 (three) times daily with meals.        . Cholecalciferol (D 400 PO) Take by mouth.        . co-enzyme Q-10 50 MG capsule Take 50 mg by mouth daily.      . cyanocobalamin (,VITAMIN B-12,) 1000 MCG/ML injection Inject 1,000 mcg into the muscle every 21 ( twenty-one) days.      Marland Kitchen dexlansoprazole (DEXILANT) 60 MG capsule Take 1 capsule (60 mg total) by mouth daily.  90 capsule  3  . diclofenac sodium (VOLTAREN) 1 % GEL Apply 1 g topically as needed.      . estazolam (PROSOM) 2 MG tablet Take 1 tablet (2 mg total) by mouth at bedtime.  30 tablet  3  . Estradiol (VAGIFEM) 10 MCG TABS Place 1 tablet (10 mcg total) vaginally 2 (two) times a week.  24 tablet  3  . Eszopiclone (ESZOPICLONE) 3 MG TABS Take 1 tablet (3 mg total) by mouth at bedtime. Take immediately before bedtime  30 tablet  3  . etodolac (LODINE XL) 400 MG 24 hr tablet Take 400 mg by mouth 2 (two) times daily.       . hydrocortisone (ANUSOL-HC) 25 MG suppository Place 1 suppository (25 mg total) rectally 2 (two) times daily.  12 suppository  5  . lamoTRIgine (LAMICTAL) 150 MG tablet Take 150 mg by mouth 2 (two) times daily.        Marland Kitchen lidocaine (LIDODERM) 5 % Place 3 patches onto the skin daily. Remove & Discard patch within 12 hours or as directed by MD       . Manganese 50 MG TABS Take 1 tablet by mouth daily.      Marland Kitchen morphine (MSIR) 15 MG tablet Take 1 tablet (15 mg total) by mouth as needed.  30 tablet  0  . ondansetron (ZOFRAN) 4 MG tablet Take 1 tablet (4 mg total) by mouth every 8 (eight)  hours as needed.  30 tablet  6  . OVER THE COUNTER MEDICATION Take 1,000  mg by mouth 3 (three) times daily. Calcium Plain OTC      . pentosan polysulfate (ELMIRON) 100 MG capsule Take 100 mg by mouth. 2 by mouth twice a day        . Polyethylene Glycol 3350 (MIRALAX PO) Take 1 scoop by mouth. Take 1scoop 3 to 5 times daily      . predniSONE (STERAPRED UNI-PAK) 5 MG TABS Take by mouth.      . pregabalin (LYRICA) 150 MG capsule Take 150 mg by mouth 3 (three) times daily.        Marland Kitchen thiamine 100 MG tablet Take 100 mg by mouth daily.      . Triamcinolone Acetonide (NASACORT AQ NA) 2 sprays by Nasal route daily.        . ANALPRAM-HC 1-2.5 % rectal cream APPLY TO AFFECTED AREA RECTALLY 3 TIMES A DAY  30 g  4  . lubiprostone (AMITIZA) 24 MCG capsule TAKE 2 CAPSULEs BY MOUTH ONCE DAILY WITH BREAKFAST  60 capsule  11  . [DISCONTINUED] [facility] SLOW RELEASE IRON 143 (45 FE) MG TBCR TAKE 1 TABLET EVERY DAY  30 tablet  0   No current facility-administered medications on file prior to visit.    BP 100/70  Pulse 115  Wt 152 lb (68.947 kg)  BMI 25.29 kg/m2  SpO2 94%  LMP 01/01/2000chart    Objective:   Physical Exam  Constitutional: She is oriented to person, place, and time. She appears well-developed and well-nourished.  HENT:  Right Ear: External ear normal.  Left Ear: External ear normal.  Nose: Nose normal.  Mouth/Throat: Oropharynx is clear and moist.  Neck: Normal range of motion. Neck supple.  Cardiovascular: Normal rate, regular rhythm and normal heart sounds.   Pulmonary/Chest: Effort normal and breath sounds normal.  Abdominal: Soft. Bowel sounds are normal.  Musculoskeletal: Normal range of motion.  Neurological: She is alert and oriented to person, place, and time.  Skin: Skin is warm and dry.  Psychiatric: She has a normal mood and affect.          Assessment & Plan:  Assessment:  1. Peripheral edema-sodium retention versus medication side effect versus synovitis 2.  Fibromyalgia 3. Depression  Plan: Will take for one week drug holiday off of Norvasc to see if it makes a difference with regards of the bilateral feet swelling. She will call on Friday and let me know how she's doing. At that time, we'll consider Lasix 20 mg once a day if her symptoms persist. Decrease sodium intake. Lab sent to include TSH, CMP, CBC will notify patient pending results.

## 2013-01-21 NOTE — Patient Instructions (Signed)

## 2013-01-23 ENCOUNTER — Encounter: Payer: Self-pay | Admitting: Family

## 2013-01-24 ENCOUNTER — Other Ambulatory Visit: Payer: Self-pay | Admitting: Family

## 2013-01-24 MED ORDER — FUROSEMIDE 20 MG PO TABS: 20.0000 mg | ORAL_TABLET | Freq: Every day | ORAL | Status: DC

## 2013-01-30 ENCOUNTER — Ambulatory Visit (INDEPENDENT_AMBULATORY_CARE_PROVIDER_SITE_OTHER): Payer: Medicare Other | Admitting: General Surgery

## 2013-01-30 ENCOUNTER — Encounter: Payer: Self-pay | Admitting: Family

## 2013-01-30 ENCOUNTER — Encounter (INDEPENDENT_AMBULATORY_CARE_PROVIDER_SITE_OTHER): Payer: Self-pay | Admitting: General Surgery

## 2013-01-30 VITALS — BP 100/64 | HR 76 | Resp 14 | Ht 66.0 in | Wt 151.6 lb

## 2013-01-30 DIAGNOSIS — K648 Other hemorrhoids: Secondary | ICD-10-CM

## 2013-01-30 DIAGNOSIS — K644 Residual hemorrhoidal skin tags: Secondary | ICD-10-CM

## 2013-01-30 MED ORDER — HYDROCORTISONE ACETATE 25 MG RE SUPP: 25.0000 mg | Freq: Two times a day (BID) | RECTAL | Status: DC

## 2013-01-30 NOTE — Patient Instructions (Signed)
GETTING TO GOOD BOWEL HEALTH. Irregular bowel habits such as constipation and diarrhea can lead to many problems over time.  Having one soft bowel movement a day is the most important way to prevent further problems.  The anorectal canal is designed to handle stretching and feces to safely manage our ability to get rid of solid waste (feces, poop, stool) out of our body.  BUT, hard constipated stools can act like ripping concrete bricks and diarrhea can be a burning fire to this very sensitive area of our body, causing inflamed hemorrhoids, anal fissures, increasing risk is perirectal abscesses, abdominal pain/bloating, an making irritable bowel worse.     The goal: ONE SOFT BOWEL MOVEMENT A DAY!  To have soft, regular bowel movements:    Drink at least 8 tall glasses of water a day.     Take plenty of fiber.  Fiber is the undigested part of plant food that passes into the colon, acting s "natures broom" to encourage bowel motility and movement.  Fiber can absorb and hold large amounts of water. This results in a larger, bulkier stool, which is soft and easier to pass. Work gradually over several weeks up to 6 servings a day of fiber (25g a day even more if needed) in the form of: o Vegetables -- Root (potatoes, carrots, turnips), leafy green (lettuce, salad greens, celery, spinach), or cooked high residue (cabbage, broccoli, etc) o Fruit -- Fresh (unpeeled skin & pulp), Dried (prunes, apricots, cherries, etc ),  or stewed ( applesauce)  o Whole grain breads, pasta, etc (whole wheat)  o Bran cereals    Bulking Agents -- This type of water-retaining fiber generally is easily obtained each day by one of the following:  o Psyllium bran -- The psyllium plant is remarkable because its ground seeds can retain so much water. This product is available as Metamucil, Konsyl, Effersyllium, Per Diem Fiber, or the less expensive generic preparation in drug and health food stores. Although labeled a laxative, it really  is not a laxative.  o Methylcellulose -- This is another fiber derived from wood which also retains water. It is available as Citrucel. o Benefiber o Polyethylene Glycol - and "artificial" fiber commonly called Miralax or Glycolax.  It is helpful for people with gassy or bloated feelings with regular fiber o Flax Seed - a less gassy fiber than psyllium   No reading or other relaxing activity while on the toilet. If bowel movements take longer than 5 minutes, you are too constipated   AVOID CONSTIPATION.  High fiber and water intake usually takes care of this.  Sometimes a laxative is needed to stimulate more frequent bowel movements, but    Laxatives are not a good long-term solution as it can wear the colon out. o Osmotics (Milk of Magnesia, Fleets phosphosoda, Magnesium citrate, MiraLax, GoLytely) are safer than  o Stimulants (Senokot, Castor Oil, Dulcolax, Ex Lax)    o Do not take laxatives for more than 7days in a row.    IF SEVERELY CONSTIPATED, try a Bowel Retraining Program: o Do not use laxatives.  o Eat a diet high in roughage, such as bran cereals and leafy vegetables.  o Drink six (6) ounces of prune or apricot juice each morning.  o Eat two (2) large servings of stewed fruit each day.  o Take one (1) heaping tablespoon of a psyllium-based bulking agent twice a day. Use sugar-free sweetener when possible to avoid excessive calories.  o Eat a normal breakfast.    o Set aside 15 minutes after breakfast to sit on the toilet, but do not strain to have a bowel movement.  o If you do not have a bowel movement by the third day, use an enema and repeat the above steps.   Fiber Chart  You should 25-30g of fiber per day and drinking 8 glasses of water to help your bowels move regularly.  In the chart below you can look up how much fiber you are getting in an average day.  If you are not getting enough fiber, you should add a fiber supplement to your diet.  Examples of this include Metamucil,  FiberCon and Citrucel.  These can be purchased at your local grocery store or pharmacy.      http://[email or web address]/offices/health/nutritioncoach/[email or web address]   

## 2013-01-30 NOTE — Progress Notes (Signed)
Patient ID: Rachel Duran, female   DOB: March 16, 1956, 57 y.o.   MRN: [id number]  Chief Complaint  Patient presents with  . New Evaluation    ext hems    HPI Rachel Duran is a 57 y.o. female.   HPI 57 year old Caucasian female who is an established patient of mine comes in complaining of long-standing hemorrhoidal problems. She's had problems with hemorrhoids since her 30s. Most the time they were managed with topical therapy such as creams and ointments; however, over the past few months she has had worsening problems with her hemorrhoids. She states that the hemorrhoids will flare and become enlarged at times. The swelling will then go down but ultimately fell getting gorged and swollen again. When they become enlarged she'll have a lot of burning and tenderness around her anus. She'll have some occasional bleeding on toilet paper. She does take long-acting morphine for her fibromyalgia. As a result she has a bowel movement about every 3-4 days. She is taking MiraLAX several times a day as well as Amitiza. She drinks about 5 glasses of water a day. She sits on the commode for less than 10 minutes at a time. She denies any perianal itching or burning. She states that Proctofoam worked well in the past. She was put on Anusol [facility] and it helped for short time. Most recently she was switched to Analpram which has helped a little bit but ultimately the hemorrhoids keep coming back. They will mainly become engorged and swollen whenever she has a bowel movement. She has never had to manually reduce hemorrhoid. She had a colonoscopy by Dr. Russella Dar about 4 years ago. She denies any weight loss. She also complains of some lower abdominal spasms when her hemorrhoids flare. She has had a recent change in her prescription drug plan and Analpram is no longer $7 a tubeBut now it is costing $50 a tube. Past Medical History  Diagnosis Date  . Disorders of porphyrin metabolism   . Irritable bowel syndrome   .  Fibromyalgia   . Arthritis   . GERD (gastroesophageal reflux disease)   . Depression   . Allergy   . Chronic pain   . Cholecystitis   . Internal hemorrhoids   . PONV (postoperative nausea and vomiting)   . Interstitial cystitis 06-06-12    hx.  . Raynauds disease     hx.  . Cervical disc disease     hx. C6- C7 -hx. past fusion(bone graft used)  . Anemia     Past Surgical History  Procedure Laterality Date  . Cervical fusion    . Cervical lamination    . Cystoscopy    . Nasal sinus surgery    . Gyn surgery    . Hysteroscopy with ablation    . Breast enhancement surgery  2010  . Nasal sinus surgery      x5  . Ercp  05/22/2012    Procedure: ENDOSCOPIC RETROGRADE CHOLANGIOPANCREATOGRAPHY (ERCP);  Surgeon: Meryl Dare, MD,FACG;  Location: [facility];  Service: Endoscopy;  Laterality: N/A;  . Cesarean section    . Hernia repair      inguinal  . Cholecystectomy  06/11/2012    Procedure: LAPAROSCOPIC CHOLECYSTECTOMY WITH INTRAOPERATIVE CHOLANGIOGRAM;  Surgeon: Atilano Ina, MD,FACS;  Location: [facility];  Service: General;  Laterality: N/A;    Family History  Problem Relation Age of Onset  . Heart disease Father   . Esophageal cancer Father   . Cancer Father   .  Scleroderma Mother   . Hypertension Mother   . Cancer Paternal Grandfather     esophageal & stomach  . Diabetes Maternal Grandmother     Social History History  Substance Use Topics  . Smoking status: Never Smoker   . Smokeless tobacco: Never Used  . Alcohol Use: No    Allergies  Allergen Reactions  . Codeine Phosphate Nausea And Vomiting    Severe n&v  . Cymbalta (Duloxetine Hcl)     Urinary retention  . Inh (Isoniazid)     Hepatitis  . Meperidine Hcl Rash    REACTION: red skin  . Sulfamethoxazole Rash    Current Outpatient Prescriptions  Medication Sig Dispense Refill  . acyclovir (ZOVIRAX) 800 MG tablet Take one-half (1/2) tablet  daily  45 tablet  1  . ALPRAZolam (XANAX) 1 MG tablet  Take 1 mg by mouth 3 (three) times daily as needed.        Marland Kitchen amLODipine (NORVASC) 5 MG tablet Take 5 mg by mouth daily.        Marland Kitchen amphetamine-dextroamphetamine (ADDERALL XR) 30 MG 24 hr capsule Take 1 capsule (30 mg total) by mouth every morning.  90 capsule  0  . ANALPRAM-HC 1-2.5 % rectal cream APPLY TO AFFECTED AREA RECTALLY 3 TIMES A DAY  30 g  4  . ARIPiprazole (ABILIFY) 10 MG tablet Take 10 mg by mouth at bedtime.       Marland Kitchen aspirin 81 MG tablet Take 81 mg by mouth daily.        . baclofen (LIORESAL) 10 MG tablet TAKE 1 TABLET THREE TIMES A DAY  270 tablet  2  . Calcium Carbonate-Vitamin D (CALCIUM-VITAMIN D) 500-200 MG-UNIT per tablet Take 1 tablet by mouth 3 (three) times daily with meals.        . Cholecalciferol (D 400 PO) Take by mouth.        . co-enzyme Q-10 50 MG capsule Take 50 mg by mouth daily.      . cyanocobalamin (,VITAMIN B-12,) 1000 MCG/ML injection Inject 1,000 mcg into the muscle every 21 ( twenty-one) days.      Marland Kitchen dexlansoprazole (DEXILANT) 60 MG capsule Take 1 capsule (60 mg total) by mouth daily.  90 capsule  3  . diclofenac sodium (VOLTAREN) 1 % GEL Apply 1 g topically as needed.      . estazolam (PROSOM) 2 MG tablet Take 1 tablet (2 mg total) by mouth at bedtime.  30 tablet  3  . Estradiol (VAGIFEM) 10 MCG TABS Place 1 tablet (10 mcg total) vaginally 2 (two) times a week.  24 tablet  3  . Eszopiclone (ESZOPICLONE) 3 MG TABS Take 1 tablet (3 mg total) by mouth at bedtime. Take immediately before bedtime  30 tablet  3  . etodolac (LODINE XL) 400 MG 24 hr tablet Take 400 mg by mouth 2 (two) times daily.       . furosemide (LASIX) 20 MG tablet Take 1 tablet (20 mg total) by mouth daily.  30 tablet  0  . hydrocortisone (ANUSOL-HC) 25 MG suppository Place 1 suppository (25 mg total) rectally 2 (two) times daily.  12 suppository  5  . lamoTRIgine (LAMICTAL) 150 MG tablet Take 150 mg by mouth 2 (two) times daily.        Marland Kitchen lidocaine (LIDODERM) 5 % Place 3 patches onto the skin  daily. Remove & Discard patch within 12 hours or as directed by MD       .  lubiprostone (AMITIZA) 24 MCG capsule TAKE 2 CAPSULEs BY MOUTH ONCE DAILY WITH BREAKFAST  60 capsule  11  . Manganese 50 MG TABS Take 1 tablet by mouth daily.      Marland Kitchen morphine (AVINZA) 120 MG 24 hr capsule Take 1 capsule (120 mg total) by mouth daily.  30 capsule  0  . morphine (MSIR) 15 MG tablet Take 1 tablet (15 mg total) by mouth as needed.  30 tablet  0  . nitrofurantoin, macrocrystal-monohydrate, (MACROBID) 100 MG capsule Take 1 capsule (100 mg total) by mouth 2 (two) times daily.  14 capsule  0  . ondansetron (ZOFRAN) 4 MG tablet Take 1 tablet (4 mg total) by mouth every 8 (eight) hours as needed.  30 tablet  6  . OVER THE COUNTER MEDICATION Take 1,000 mg by mouth 3 (three) times daily. Calcium Plain OTC      . pentosan polysulfate (ELMIRON) 100 MG capsule Take 100 mg by mouth. 2 by mouth twice a day        . Polyethylene Glycol 3350 (MIRALAX PO) Take 1 scoop by mouth. Take 1scoop 3 to 5 times daily      . predniSONE (STERAPRED UNI-PAK) 5 MG TABS Take by mouth.      . pregabalin (LYRICA) 150 MG capsule Take 150 mg by mouth 3 (three) times daily.        Marland Kitchen thiamine 100 MG tablet Take 100 mg by mouth daily.      . Triamcinolone Acetonide (NASACORT AQ NA) 2 sprays by Nasal route daily.        . [DISCONTINUED] [facility] SLOW RELEASE IRON 143 (45 FE) MG TBCR TAKE 1 TABLET EVERY DAY  30 tablet  0   No current facility-administered medications for this visit.    Review of Systems Review of Systems  Constitutional: Negative for fever, chills and unexpected weight change.  HENT: Positive for neck pain. Negative for hearing loss, congestion, sore throat, trouble swallowing and voice change.   Eyes: Negative for visual disturbance.  Respiratory: Negative for cough and wheezing.   Cardiovascular: Negative for chest pain, palpitations and leg swelling.  Gastrointestinal: Positive for abdominal pain, constipation and rectal pain.  Negative for nausea, vomiting, diarrhea, blood in stool, abdominal distention and anal bleeding.  Genitourinary: Negative for hematuria and vaginal bleeding.       Interstitial cystitis  Musculoskeletal:       +OA  Skin: Negative for rash and wound.  Neurological: Negative for seizures, syncope and headaches.       [name] syndrome, fibromyalgia  Hematological: Negative for adenopathy. Does not bruise/bleed easily.  Psychiatric/Behavioral: Negative for confusion.    Blood pressure 100/64, pulse 76, resp. rate 14, height 5\' 6"  (1.676 m), weight 151 lb 9.6 oz (68.765 kg), last menstrual period 07/25/1998.  Physical Exam Physical Exam  Constitutional: She is oriented to person, place, and time. She appears well-developed and well-nourished. No distress.  HENT:  Head: Normocephalic and atraumatic.  Right Ear: External ear normal.  Left Ear: External ear normal.  Eyes: Conjunctivae are normal. No scleral icterus.  Neck: Normal range of motion. Neck supple. No tracheal deviation present. No thyromegaly present.  Cardiovascular: Normal rate, normal heart sounds and intact distal pulses.   Pulmonary/Chest: Effort normal and breath sounds normal. No respiratory distress. She has no wheezes.  Abdominal: She exhibits no distension. There is no tenderness. There is no rebound and no guarding.  Genitourinary:  Left lateral and right lateral redundant external hemorrhoidal tissue.  Good tone. Anoscopy shows left lateral and right lateral mixed, internal-external hemorrhoids. No anal fissure. No masses.  Musculoskeletal: Normal range of motion. She exhibits no edema and no tenderness.  Lymphadenopathy:    She has no cervical adenopathy.  Neurological: She is alert and oriented to person, place, and time. She exhibits normal muscle tone.  Skin: Skin is warm and dry. No rash noted. She is not diaphoretic. No erythema. No pallor.  Psychiatric: She has a normal mood and affect. Her behavior is normal.  Judgment and thought content normal.    Data Reviewed Dr Russella Dar telephone note My last office note  Assessment    Constipation Mixed column internal/external hemorrhoids x 2     Plan    We discussed the etiology of hemorrhoids. The patient was given educational material as well as diagrams. We discussed nonoperative and operative management of hemorrhoidal disease.  We discussed the importance of having a daily soft bowel movement and avoiding constipation. We also discussed good bowel habits such as not reading in the bathroom, not straining, and drinking 6-8 glasses of water per day. We also discussed the importance of a high fiber diet. We discussed foods that were high in fiber as well as fiber supplements. We discussed the importance of trying to get 25-30 g of fiber per day in their diet. We discussed the need to start with a low dose of fiber and then gradually increasing their daily fiber dose over several weeks in order to avoid bloating and cramping.  We then discussed different surgical techniques for hemorrhoids, specifically hemorrhoidal banding and excisional hemorrhoidectomy.  PLAN: The patient is not a candidate for hemorrhoidal banding since there is an external component. I recommended institution of a high fiber diet with fiber supplementation with a 6 week followup and seeing how she does or institution of a high-fiber diet with fiber supplementation and go ahead and scheduling her for exam under anesthesia and excisional hemorrhoidectomy x2 (depending on how bad her hemorrhoids were bothering her)  I discussed the procedure in detail.  The patient was given Agricultural engineer.  We discussed the risks and benefits of surgery including, but not limited to bleeding, infection, blood clot formation, anesthesia risk, urinary retention, hemorrhoid recurrence, injury to the sphincters resulting in incontinence, and the rare possibility of anal canal narrowing. I explained that  the likelihood of improvement of their symptoms is fair  We discussed the typical postoperative course.  I stressed the importance of not becoming constipated after surgery.  The patient was encouraged to limit pain medication if possible as this increases the likelihood of becoming constipated. The patient was advised to take stool softners & drink 8-10 glasses of non-carbonated, non-alcoholic beverages per day and to eat a high fiber diet.  I also encouraged soaking in a water warm bath for 15 minutes at a time several times a day and after a bowel movement.  The patient was advised to take laxatives such as milk of magnesia or Miralax if no bowel movement three days after surgery.  The patient was advised to expect some blood tinged drainage as well as some blood in their bowel movements.    The patient has elected to start with a high fiber diet with fiber supplementation and see how she does. I think this is completely reasonable. In the interim I I renewed her Anusol suppository prescription. We discussed sitz baths. Followup 6 weeks  Rachel Duran. Rachel Campanile, MD, FACS General, Bariatric, & Minimally Invasive [facility]         [facility] M 01/30/2013, 2:07 PM

## 2013-02-01 ENCOUNTER — Other Ambulatory Visit: Payer: Self-pay | Admitting: *Deleted

## 2013-02-01 ENCOUNTER — Encounter: Payer: Self-pay | Admitting: Internal Medicine

## 2013-02-01 ENCOUNTER — Telehealth: Payer: Self-pay | Admitting: Internal Medicine

## 2013-02-01 MED ORDER — HYDROXYCHLOROQUINE SULFATE 200 MG PO TABS: 200.0000 mg | ORAL_TABLET | Freq: Every day | ORAL | Status: DC

## 2013-02-01 NOTE — Telephone Encounter (Signed)
PT called and requested that she been seen by Dr. Lovell Sheehan prior to 8/26. She is having severe swelling in her hands/feet/legs. She states that she has seen Padonda, but doesn't feel like she made any positive changes. She's taking a low dose of lasix, but has received little to no improvement. Please assist.

## 2013-02-01 NOTE — Telephone Encounter (Signed)
Per dr Lovell Sheehan- probably com ing from stopping plaquinil. Start back on plaquinil 200 bied and will ch eck  Liver at ov in august

## 2013-02-06 ENCOUNTER — Other Ambulatory Visit: Payer: Self-pay | Admitting: Internal Medicine

## 2013-02-06 ENCOUNTER — Other Ambulatory Visit (INDEPENDENT_AMBULATORY_CARE_PROVIDER_SITE_OTHER): Payer: Medicare Other

## 2013-02-06 ENCOUNTER — Telehealth: Payer: Self-pay | Admitting: Internal Medicine

## 2013-02-06 DIAGNOSIS — R609 Edema, unspecified: Secondary | ICD-10-CM

## 2013-02-06 LAB — BASIC METABOLIC PANEL
BUN: 12 mg/dL (ref 6–23)
Calcium: 9.4 mg/dL (ref 8.4–10.5)
Creatinine, Ser: 0.7 mg/dL (ref 0.4–1.2)
GFR: 93.87 mL/min (ref >60.00)
Glucose, Bld: 102 mg/dL — ABNORMAL HIGH (ref 70–99)
Potassium: 4.8 mEq/L (ref 3.5–5.1)

## 2013-02-06 NOTE — Telephone Encounter (Signed)
Per dr Lovell Sheehan- order b-met,echo and nephrologist referral--echo and nephrologist referral sent

## 2013-02-06 NOTE — Telephone Encounter (Signed)
Patient Information:  Caller Name: Onalee Hua  Phone: [phone]  Patient: Rachel, Duran  Gender: Female  DOB: [birth year]  Age: 57 Years  PCP: Darryll Capers (Adults only)  Pregnant: No  Office Follow Up:  Does the office need to follow up with this patient?: Yes  Instructions For The Office: Patient needs appointment or needs to go to ED. Husband requesting appointment with Dr. Lovell Sheehan.  RN Note:  Husband is requesting an appointment with Dr. Lovell Sheehan since he is familiar with the swelling. Please contact husband to give instructions about when to come into the office or if he needs to take patient to the ED.   Symptoms  Reason For Call & Symptoms: Reports swelling to lower extremities and upper extremities. Reports swelling has gotten above the knees at this time. MD has been working with patient on this issue.  Reviewed Health History In EMR: Yes  Reviewed Medications In EMR: Yes  Reviewed Allergies In EMR: Yes  Reviewed Surgeries / Procedures: Yes  Date of Onset of Symptoms: 02/04/2013  Treatments Tried: Plaquenil  Treatments Tried Worked: No OB / GYN:  LMP: Unknown  Guideline(s) Used:  Leg Swelling and Edema  Disposition Per Guideline:   Go to ED Now (or to Office with PCP Approval)  Reason For Disposition Reached:   Severe swelling (e.g., swelling extends above knee, entire leg is swollen, weeping fluid)  Advice Given:  N/A  Patient Will Follow Care Advice:  YES

## 2013-02-07 ENCOUNTER — Other Ambulatory Visit: Payer: Medicare Other

## 2013-02-13 ENCOUNTER — Other Ambulatory Visit (HOSPITAL_COMMUNITY): Payer: Medicare Other

## 2013-02-15 ENCOUNTER — Encounter: Payer: Self-pay | Admitting: Internal Medicine

## 2013-02-17 ENCOUNTER — Other Ambulatory Visit: Payer: Self-pay | Admitting: Internal Medicine

## 2013-02-18 ENCOUNTER — Other Ambulatory Visit (INDEPENDENT_AMBULATORY_CARE_PROVIDER_SITE_OTHER): Payer: Medicare Other

## 2013-02-18 ENCOUNTER — Telehealth: Payer: Self-pay | Admitting: Internal Medicine

## 2013-02-18 ENCOUNTER — Encounter: Payer: Self-pay | Admitting: Internal Medicine

## 2013-02-18 DIAGNOSIS — IMO0001 Reserved for inherently not codable concepts without codable children: Secondary | ICD-10-CM

## 2013-02-18 DIAGNOSIS — R609 Edema, unspecified: Secondary | ICD-10-CM

## 2013-02-18 DIAGNOSIS — M359 Systemic involvement of connective tissue, unspecified: Secondary | ICD-10-CM

## 2013-02-18 LAB — POCT URINALYSIS DIPSTICK
Blood, UA: NEGATIVE
Leukocytes, UA: NEGATIVE
Nitrite, UA: NEGATIVE
Protein, UA: NEGATIVE
Urobilinogen, UA: 0.2
pH, UA: 7

## 2013-02-18 MED ORDER — METHYLPREDNISOLONE ACETATE 80 MG/ML IJ SUSP: 80.0000 mg | INTRAMUSCULAR | Status: AC

## 2013-02-18 MED ORDER — METHYLPREDNISOLONE ACETATE 80 MG/ML IJ SUSP
80.0000 mg | Freq: Once | INTRAMUSCULAR | Status: AC
  Administered 2013-02-18: 80 mg via INTRAMUSCULAR

## 2013-02-18 MED ORDER — MORPHINE SULFATE ER BEADS 120 MG PO CP24: 120.0000 mg | ORAL_CAPSULE | Freq: Every day | ORAL | Status: DC

## 2013-02-18 NOTE — Telephone Encounter (Signed)
avinza ready for pick up -urine showed no protein- per dr Lovell Sheehan if no protein in urine give depomedrol 80 mg- there was no protein in urine- depomedrol given in left gluteal/  Pt give jug for spot urine protein for 24 hours

## 2013-02-18 NOTE — Telephone Encounter (Signed)
PT called to request a one month supply of her morphine (AVINZA) 120 MG 24 hr capsule. She is also questioning is she should continue to take her furosemide (LASIX) 20 MG tablet. If so, she would like that called into [facility] on [address]. Please assist.

## 2013-02-19 ENCOUNTER — Other Ambulatory Visit: Payer: Self-pay | Admitting: *Deleted

## 2013-02-19 MED ORDER — FUROSEMIDE 20 MG PO TABS: 20.0000 mg | ORAL_TABLET | Freq: Every day | ORAL | Status: DC

## 2013-02-19 MED ORDER — MORPHINE SULFATE 15 MG PO TABS: 15.0000 mg | ORAL_TABLET | ORAL | Status: DC | PRN

## 2013-02-20 LAB — PROTEIN / CREATININE RATIO, URINE

## 2013-02-21 ENCOUNTER — Ambulatory Visit (HOSPITAL_COMMUNITY): Payer: Medicare Other | Attending: Cardiology

## 2013-02-21 ENCOUNTER — Encounter: Payer: Self-pay | Admitting: Internal Medicine

## 2013-02-21 ENCOUNTER — Other Ambulatory Visit (HOSPITAL_COMMUNITY): Payer: Medicare Other

## 2013-02-21 DIAGNOSIS — I7300 Raynaud's syndrome without gangrene: Secondary | ICD-10-CM | POA: Insufficient documentation

## 2013-02-21 DIAGNOSIS — I379 Nonrheumatic pulmonary valve disorder, unspecified: Secondary | ICD-10-CM | POA: Insufficient documentation

## 2013-02-21 DIAGNOSIS — I080 Rheumatic disorders of both mitral and aortic valves: Secondary | ICD-10-CM | POA: Insufficient documentation

## 2013-02-21 DIAGNOSIS — IMO0001 Reserved for inherently not codable concepts without codable children: Secondary | ICD-10-CM | POA: Insufficient documentation

## 2013-02-21 DIAGNOSIS — R5381 Other malaise: Secondary | ICD-10-CM | POA: Insufficient documentation

## 2013-02-21 DIAGNOSIS — R5383 Other fatigue: Secondary | ICD-10-CM | POA: Insufficient documentation

## 2013-02-21 DIAGNOSIS — I079 Rheumatic tricuspid valve disease, unspecified: Secondary | ICD-10-CM | POA: Insufficient documentation

## 2013-02-21 DIAGNOSIS — R609 Edema, unspecified: Secondary | ICD-10-CM

## 2013-02-21 NOTE — Progress Notes (Signed)
Echocardiogram performed.  

## 2013-02-28 ENCOUNTER — Encounter: Payer: Self-pay | Admitting: Internal Medicine

## 2013-02-28 DIAGNOSIS — R7611 Nonspecific reaction to tuberculin skin test without active tuberculosis: Secondary | ICD-10-CM

## 2013-03-04 ENCOUNTER — Telehealth: Payer: Self-pay | Admitting: *Deleted

## 2013-03-04 NOTE — Telephone Encounter (Signed)
done

## 2013-03-14 ENCOUNTER — Encounter (INDEPENDENT_AMBULATORY_CARE_PROVIDER_SITE_OTHER): Payer: Medicare Other | Admitting: General Surgery

## 2013-03-21 ENCOUNTER — Encounter (INDEPENDENT_AMBULATORY_CARE_PROVIDER_SITE_OTHER): Payer: Self-pay | Admitting: General Surgery

## 2013-03-21 ENCOUNTER — Ambulatory Visit (INDEPENDENT_AMBULATORY_CARE_PROVIDER_SITE_OTHER): Payer: Medicare Other | Admitting: General Surgery

## 2013-03-21 VITALS — BP 120/70 | HR 80 | Temp 98.6°F | Resp 15 | Ht 66.0 in | Wt 148.0 lb

## 2013-03-21 DIAGNOSIS — K644 Residual hemorrhoidal skin tags: Secondary | ICD-10-CM

## 2013-03-21 DIAGNOSIS — K648 Other hemorrhoids: Secondary | ICD-10-CM

## 2013-03-21 NOTE — Progress Notes (Signed)
Subjective:     Patient ID: Rachel Duran, female   DOB: 03/29/1956, 57 y.o.   MRN: [id number]  HPI 57 year old Caucasian female comes in for followup after being seen for constipation as well as hemorrhoidal problems on July 9. At that time we instituted A high fiber diet with Benefiber supplementation as well as increased water intake. The patient states since having the Benefiber to her diet her bowel movements have significantly increased in frequency. She is now averaging a bowel movement at least every other day if not daily which is a significant improvement for her. She is still taking MiraLAX as well. Her hemorrhoidal flares are improved but they still occur. She states that she has only had one episode of bleeding since she was seen in the office. She states that she still having to use hemorrhoidal suppositories frequently. She denies any pain with defecation.  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 However her liver numbers increased in INH so they had to stop that treatment. She is currently scheduled to see an infectious disease doctor in mid-September . PMHx, PSHx, SOCHx, FAMHx, ALL reviewed   Review of Systems As above    Objective:   Physical Exam BP 120/70  Pulse 80  Temp(Src) 98.6 F (37 C) (Temporal)  Resp 15  Ht 5\' 6"  (1.676 m)  Wt 148 lb (67.132 kg)  BMI 23.9 kg/m2  LMP 07/25/1998 Alert, no apparent distress Clear to auscultation bilaterally Regular rate and rhythm Soft, nontender, nondistended Mild amount of redundant nonthrombosed external hemorrhoidal tissue in the left lateral position. Good tone. Anoscopy shows small grade 1 internal hemorrhoids in the right lateral position. She has an enlarged internal hemorrhoid in the left lateral position    Assessment:     Bleeding internal-external  hemorrhoids-3 column     Plan:     Overall her constipation is significantly improved. I think this will help with her postoperatively currently. We discussed ongoing nonoperative management versus operative management. The patient is interested in operative management at this point. I think that is appropriate now that we have her bowel movements on a much more frequent basis.  I discussed the procedure in detail.  The patient was given Agricultural engineer.  We discussed the risks and benefits of surgery including, but not limited to bleeding, infection, blood clot formation, anesthesia risk, urinary retention, hemorrhoid recurrence, injury to the sphincters resulting in incontinence, and the rare possibility of anal canal narrowing. I explained that the likelihood of improvement of their symptoms is good  We discussed the typical postoperative course.  I stressed the importance of not becoming constipated after surgery.  The patient was encouraged to limit pain medication if possible as this increases the likelihood of becoming constipated. The patient was advised to take stool softners & drink 8-10 glasses of non-carbonated, non-alcoholic beverages per day and to eat a high fiber diet.  I also encouraged soaking in a water warm bath for 15 minutes at a time several times a day and after a bowel movement.  The patient was advised to take laxatives such as milk of magnesia or Miralax if no bowel movement three days after surgery.  The patient was advised to expect some blood tinged drainage as well as some blood in their bowel movements.   She will be scheduled for an exam  under anesthesia, excisional hemorrhoidectomy, and hemorrhoidal banding. I explained that we would try to band his hernia hemorrhoids is possible. However the left lateral hemorrhoid will probably require hemorrhoidectomy. She is looking to have surgery sometime in mid to late September.  Mary Sella. Andrey Campanile, MD, FACS General, Bariatric,  & Minimally Invasive Surgery [facility] [place]

## 2013-03-21 NOTE — Patient Instructions (Addendum)
Please email me thru MyChart to verify you received instructions  Hemorrhoids Hemorrhoids are swollen veins around the rectum or anus. There are two types of hemorrhoids:   Internal hemorrhoids. These occur in the veins just inside the rectum. They may poke through to the outside and become irritated and painful.  External hemorrhoids. These occur in the veins outside the anus and can be felt as a painful swelling or hard lump near the anus. CAUSES  Pregnancy.   Obesity.   Constipation or diarrhea.   Straining to have a bowel movement.   Sitting for long periods on the toilet.  Heavy lifting or other activity that caused you to strain.  Anal intercourse. SYMPTOMS   Pain.   Anal itching or irritation.   Rectal bleeding.   Fecal leakage.   Anal swelling.   One or more lumps around the anus.  DIAGNOSIS  Your caregiver may be able to diagnose hemorrhoids by visual examination. Other examinations or tests that may be performed include:   Examination of the rectal area with a gloved hand (digital rectal exam).   Examination of anal canal using a small tube (scope).   A blood test if you have lost a significant amount of blood.  A test to look inside the colon (sigmoidoscopy or colonoscopy). TREATMENT Most hemorrhoids can be treated at home. However, if symptoms do not seem to be getting better or if you have a lot of rectal bleeding, your caregiver may perform a procedure to help make the hemorrhoids get smaller or remove them completely. Possible treatments include:   Placing a rubber band at the base of the hemorrhoid to cut off the circulation (rubber band ligation).   Injecting a chemical to shrink the hemorrhoid (sclerotherapy).   Using a tool to burn the hemorrhoid (infrared light therapy).   Surgically removing the hemorrhoid (hemorrhoidectomy).   Stapling the hemorrhoid to block blood flow to the tissue (hemorrhoid stapling).  HOME CARE  INSTRUCTIONS   Eat foods with fiber, such as whole grains, beans, nuts, fruits, and vegetables. Ask your doctor about taking products with added fiber in them (fibersupplements).  Increase fluid intake. Drink enough water and fluids to keep your urine clear or pale yellow.   Exercise regularly.   Go to the bathroom when you have the urge to have a bowel movement. Do not wait.   Avoid straining to have bowel movements.   Keep the anal area dry and clean. Use wet toilet paper or moist towelettes after a bowel movement.   Medicated creams and suppositories may be used or applied as directed.   Only take over-the-counter or prescription medicines as directed by your caregiver.   Take warm sitz baths for 15 20 minutes, 3 4 times a day to ease pain and discomfort.   Place ice packs on the hemorrhoids if they are tender and swollen. Using ice packs between sitz baths may be helpful.   Put ice in a plastic bag.   Place a towel between your skin and the bag.   Leave the ice on for 15 20 minutes, 3 4 times a day.   Do not use a donut-shaped pillow or sit on the toilet for long periods. This increases blood pooling and pain.  SEEK MEDICAL CARE IF:  You have increasing pain and swelling that is not controlled by treatment or medicine.  You have uncontrolled bleeding.  You have difficulty or you are unable to have a bowel movement.  You have pain  or inflammation outside the area of the hemorrhoids. MAKE SURE YOU:  Understand these instructions.  Will watch your condition.  Will get help right away if you are not doing well or get worse. Document Released: 07/08/2000 Document Revised: 06/27/2012 Document Reviewed: 05/15/2012 [facility] Patient Information 2014 [place], [place].

## 2013-03-22 ENCOUNTER — Encounter: Payer: Self-pay | Admitting: Internal Medicine

## 2013-03-22 ENCOUNTER — Ambulatory Visit (INDEPENDENT_AMBULATORY_CARE_PROVIDER_SITE_OTHER): Payer: Medicare Other | Admitting: Internal Medicine

## 2013-03-22 VITALS — BP 120/70 | HR 72 | Temp 98.6°F | Resp 16 | Ht 66.0 in | Wt 146.0 lb

## 2013-03-22 DIAGNOSIS — IMO0002 Reserved for concepts with insufficient information to code with codable children: Secondary | ICD-10-CM

## 2013-03-22 DIAGNOSIS — M659 Synovitis and tenosynovitis, unspecified: Secondary | ICD-10-CM

## 2013-03-22 DIAGNOSIS — F988 Other specified behavioral and emotional disorders with onset usually occurring in childhood and adolescence: Secondary | ICD-10-CM

## 2013-03-22 DIAGNOSIS — M542 Cervicalgia: Secondary | ICD-10-CM

## 2013-03-22 DIAGNOSIS — IMO0001 Reserved for inherently not codable concepts without codable children: Secondary | ICD-10-CM

## 2013-03-22 LAB — BASIC METABOLIC PANEL
CO2: 27 mEq/L (ref 19–32)
Calcium: 9.8 mg/dL (ref 8.4–10.5)
Chloride: 103 mEq/L (ref 96–112)
Glucose, Bld: 88 mg/dL (ref 70–99)
Potassium: 4.2 mEq/L (ref 3.5–5.1)
Sodium: 139 mEq/L (ref 135–145)

## 2013-03-22 MED ORDER — AMPHETAMINE-DEXTROAMPHET ER 30 MG PO CP24: 30.0000 mg | ORAL_CAPSULE | ORAL | Status: DC

## 2013-03-22 MED ORDER — MORPHINE SULFATE ER BEADS 120 MG PO CP24: 120.0000 mg | ORAL_CAPSULE | Freq: Every day | ORAL | Status: DC

## 2013-03-22 NOTE — Progress Notes (Signed)
  Subjective:    Patient ID: Rachel Duran, female    DOB: 05/18/1956, 57 y.o.   MRN: [id number]  HPI  Surgery planned for internal and external hemorrhoids Due to positive PP she could not take a biologic She has taken MTX for synovitis in the past She was on Plaquenil for rash Significant edema in upper and lower edema Resumed the plaquenil for a short period of time without changing the edema reviewed with the opthalmology for plaquenil use Her rheumatologist had questioned MCT Edema has begun to resolve     Review of Systems  Constitutional: Positive for fatigue.  HENT: Positive for congestion, neck stiffness and tinnitus.   Cardiovascular: Positive for leg swelling.  Gastrointestinal: Positive for abdominal distention.  Genitourinary: Positive for frequency and difficulty urinating.       IC  Musculoskeletal: Positive for myalgias, joint swelling and arthralgias.  Psychiatric/Behavioral: The patient is nervous/anxious.        Objective:   Physical Exam  Constitutional: She is oriented to person, place, and time. She appears well-developed and well-nourished. No distress.  HENT:  Head: Normocephalic and atraumatic.  Left Ear: External ear normal.  Eyes: Conjunctivae and EOM are normal. Pupils are equal, round, and reactive to light.  Neck: Normal range of motion. Neck supple. No JVD present. No tracheal deviation present. No thyromegaly present.  Cardiovascular: Normal rate, regular rhythm, normal heart sounds and intact distal pulses.   No murmur heard. Pulmonary/Chest: Effort normal and breath sounds normal. She has no wheezes. She exhibits no tenderness.  Abdominal: Soft. Bowel sounds are normal.  Musculoskeletal: Normal range of motion. She exhibits edema and tenderness.  Lymphadenopathy:    She has no cervical adenopathy.  Neurological: She is alert and oriented to person, place, and time. She has normal reflexes. No cranial nerve deficit.  Skin: Skin is warm  and dry. No rash noted. She is not diaphoretic. There is erythema.  Psychiatric: She has a normal mood and affect. Her behavior is normal.          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 I have recommended that she remain off the plaquinil and methotrexatefor now since the synovitis is getting into a remission phase.  We would ask ID to consider the use of a prophylactic drug with antituberculin activity while she tried a course of Ebrel  Continue pain management per protocol

## 2013-03-22 NOTE — Patient Instructions (Addendum)
Ask ID to repeat the TB "gold" test   With resolving edema...stay the course and not resume the methotrexate at this point  If the edema and pain recur before we make a determination on the safety of a biologic agent we could resume prednisone and methotrexate.  I would not resume the plaqunil at this point

## 2013-04-04 ENCOUNTER — Ambulatory Visit: Payer: Medicare Other | Admitting: Internal Medicine

## 2013-04-09 ENCOUNTER — Other Ambulatory Visit: Payer: Self-pay | Admitting: Internal Medicine

## 2013-04-11 ENCOUNTER — Ambulatory Visit (INDEPENDENT_AMBULATORY_CARE_PROVIDER_SITE_OTHER): Payer: Medicare Other | Admitting: Internal Medicine

## 2013-04-11 ENCOUNTER — Encounter: Payer: Self-pay | Admitting: Internal Medicine

## 2013-04-11 VITALS — BP 137/79 | HR 82 | Temp 98.5°F | Ht 66.5 in | Wt 150.0 lb

## 2013-04-11 DIAGNOSIS — A150 Tuberculosis of lung: Secondary | ICD-10-CM

## 2013-04-11 DIAGNOSIS — Z227 Latent tuberculosis: Secondary | ICD-10-CM

## 2013-04-11 DIAGNOSIS — R7989 Other specified abnormal findings of blood chemistry: Secondary | ICD-10-CM

## 2013-04-11 DIAGNOSIS — R110 Nausea: Secondary | ICD-10-CM

## 2013-04-11 LAB — COMPLETE METABOLIC PANEL WITH GFR
ALT: 14 U/L (ref 0–35)
AST: 17 U/L (ref 0–37)
Alkaline Phosphatase: 82 U/L (ref 39–117)
Calcium: 9.3 mg/dL (ref 8.4–10.5)
Chloride: 103 mEq/L (ref 96–112)
Creat: 0.58 mg/dL (ref 0.50–1.10)

## 2013-04-11 MED ORDER — ONDANSETRON HCL 4 MG PO TABS: 4.0000 mg | ORAL_TABLET | Freq: Three times a day (TID) | ORAL | Status: DC | PRN

## 2013-04-11 MED ORDER — RIFAMPIN 300 MG PO CAPS: 300.0000 mg | ORAL_CAPSULE | Freq: Two times a day (BID) | ORAL | Status: DC

## 2013-04-11 NOTE — Progress Notes (Signed)
[facility] NOTE  RFV: community referral for ltbi Subjective:    Patient ID: Rachel Duran, female    DOB: 04/22/1956, 57 y.o.   MRN: [id number]  HPI FM, synovitis, on MTx, plaquinal but not responsive in the last year, swelling of joints, and looking to start immunomodulator for treatment for RA. 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 Now she is referred back to our clinic to see if any alternatives for treatment of latent TB since this summer has had 3 bouts of significant arthritis/joint swelling and would like to have better management of symptoms. She is planning to have hemorrhoid surgery on oct 3rd by dr. Andrey Campanile  Worked as receptionist in urologist office, only traveled to [place], no volunteering to homeless shelter. Low risk for acquisition of tb exposiure  Allergies  Allergen Reactions  . Codeine Phosphate Nausea And Vomiting    Severe n&v  . Cymbalta [Duloxetine Hcl]     Urinary retention  . Inh [Isoniazid]     Hepatitis  . Meperidine Hcl Rash    REACTION: red skin  . Sulfamethoxazole Rash     Current Outpatient Prescriptions on File Prior to Visit  Medication Sig Dispense Refill  . acyclovir (ZOVIRAX) 800 MG tablet Take one-half tablet by  mouth daily  45 tablet  3  . ALPRAZolam (XANAX) 1 MG tablet Take 1 mg by mouth 3 (three) times daily as needed.        Marland Kitchen amLODipine (NORVASC) 5 MG tablet Take 5 mg by mouth daily.        Marland Kitchen amphetamine-dextroamphetamine (ADDERALL XR) 30 MG 24 hr capsule Take 1 capsule (30 mg total) by mouth every morning.  90 capsule  0  . ANALPRAM-HC 1-2.5 % rectal cream APPLY TO AFFECTED AREA RECTALLY 3 TIMES A DAY  30 g  4  . ARIPiprazole (ABILIFY) 10 MG tablet Take 10 mg by mouth at bedtime.       Marland Kitchen aspirin 81 MG tablet Take 81 mg by mouth daily.        . baclofen (LIORESAL) 10 MG tablet TAKE 1 TABLET THREE  TIMES A DAY  270 tablet  2  . Calcium Carbonate-Vitamin D (CALCIUM-VITAMIN D) 500-200 MG-UNIT per tablet Take 1 tablet by mouth 3 (three) times daily with meals.        . Cholecalciferol (D 400 PO) Take by mouth.        . co-enzyme Q-10 50 MG capsule Take 50 mg by mouth daily.      . cyanocobalamin (,VITAMIN B-12,) 1000 MCG/ML injection Inject 1ml ( ) into the muscle every 30 days  10 mL  0  . dexlansoprazole (DEXILANT) 60 MG capsule Take 1 capsule (60 mg total) by mouth daily.  90 capsule  3  . diclofenac sodium (VOLTAREN) 1 % GEL Apply 1 g topically as needed.      . Estradiol (VAGIFEM) 10 MCG TABS Place 1 tablet (10 mcg total) vaginally 2 (two) times a week.  24 tablet  3  . Eszopiclone (ESZOPICLONE) 3 MG TABS Take 1 tablet (3 mg total) by mouth at bedtime. Take immediately before bedtime  30 tablet  3  . furosemide (LASIX) 20 MG tablet Take 1 tablet (20 mg total) by mouth daily.  30 tablet  3  . hydrocortisone (ANUSOL-HC) 25 MG suppository Place 1 suppository (25 mg total)  rectally 2 (two) times daily.  12 suppository  5  . lamoTRIgine (LAMICTAL) 150 MG tablet Take 150 mg by mouth 2 (two) times daily.        Marland Kitchen lubiprostone (AMITIZA) 24 MCG capsule TAKE 2 CAPSULEs BY MOUTH ONCE DAILY WITH BREAKFAST  60 capsule  11  . Manganese 50 MG TABS Take 1 tablet by mouth daily.      Marland Kitchen morphine (AVINZA) 120 MG 24 hr capsule Take 1 capsule (120 mg total) by mouth daily.  30 capsule  0  . morphine (MSIR) 15 MG tablet Take 1 tablet (15 mg total) by mouth as needed.  30 tablet  0  . ondansetron (ZOFRAN) 4 MG tablet Take 1 tablet (4 mg total) by mouth every 8 (eight) hours as needed.  30 tablet  6  . OVER THE COUNTER MEDICATION Take 1,000 mg by mouth 3 (three) times daily. Calcium Plain OTC      . pentosan polysulfate (ELMIRON) 100 MG capsule Take 100 mg by mouth. 2 by mouth twice a day        . Polyethylene Glycol 3350 (MIRALAX PO) Take 1 scoop by mouth. Take 1scoop 3 to 5 times daily      . pregabalin  (LYRICA) 150 MG capsule Take 150 mg by mouth 3 (three) times daily.        Marland Kitchen thiamine 100 MG tablet Take 100 mg by mouth daily.      . Triamcinolone Acetonide (NASACORT AQ NA) 2 sprays by Nasal route daily.        . [DISCONTINUED] [facility] SLOW RELEASE IRON 143 (45 FE) MG TBCR TAKE 1 TABLET EVERY DAY  30 tablet  0   No current facility-administered medications on file prior to visit.   Active Ambulatory Problems    Diagnosis Date Noted  . 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 ANEMIA OF CHRONIC DISEASE 03/17/2009  . INSOMNIA, CHRONIC 11/09/2007  . DEPRESSION 03/05/2007  . DISORDERS, ORGNC INSOMNIA D/T MENTAL DISORDER 03/05/2007  . [name] SYNDROME 03/05/2007  . ALLERGIC RHINITIS 04/12/2007  . APHTHOUS ULCERS 01/31/2008  . GERD 03/05/2007  . CONSTIPATION, DRUG INDUCED 10/11/2007  . CYSTITIS, CHRONIC INTERSTITIAL 12/06/2006  . ROSACEA 03/05/2007  . OSTEOARTHRITIS 03/05/2007  . NECK PAIN, CHRONIC 10/01/2009  . CERVICAL RADICULOPATHY 05/28/2010  . UNSPECIFIED SYNOVITIS AND TENOSYNOVITIS 04/30/2009  . FIBROMYALGIA, SEVERE 12/06/2006  . NEURALGIA 03/05/2007  . CRAMP IN LIMB 02/28/2008  . SYMPTOM, MALAISE AND FATIGUE NEC 03/05/2007  . CHEST PAIN UNSPECIFIED 02/28/2008  . Varicose veins of lower extremities with other complications 09/05/2011  . 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 Nonspecific (abnormal) findings on radiological and other examination of biliary tract 05/22/2012  . Abdominal pain, right upper quadrant 05/22/2012  . Postmenopausal atrophic vaginitis 10/19/2012  . Internal and external bleeding hemorrhoids 01/30/2013   Resolved Ambulatory Problems    Diagnosis Date Noted  . Acute cystitis 10/15/2009  . Abnormal weight gain 01/31/2008  . WEIGHT LOSS, ABNORMAL 12/30/2008  . ABDOMINAL PAIN RIGHT LOWER QUADRANT 09/06/2007  . Nonspecific elevation of levels of transaminase or lactic acid dehydrogenase (LDH) 05/22/2012   Past Medical History  Diagnosis Date  .  Irritable bowel syndrome   . Fibromyalgia   . Arthritis   . GERD (gastroesophageal reflux disease)   . Depression   . Allergy   . Chronic pain   . Cholecystitis   . Internal hemorrhoids   . PONV (postoperative nausea and vomiting)   . Interstitial cystitis 06-06-12  .  Raynauds disease   . Cervical disc disease   . Anemia    History  Substance Use Topics  . Smoking status: Never Smoker   . Smokeless tobacco: Never Used  . Alcohol Use: No  family history includes Cancer in her father and paternal grandfather; Diabetes in her maternal grandmother; Esophageal cancer in her father; Heart disease in her father; Hypertension in her mother; Scleroderma in her mother.   Review of Systems  Constitutional: Negative for fever, chills, diaphoresis, activity change, appetite change, fatigue and unexpected weight change.  HENT: Negative for congestion, sore throat, rhinorrhea, sneezing, trouble swallowing and sinus pressure.  Eyes: Negative for photophobia and visual disturbance.  Respiratory: Negative for cough, chest tightness, shortness of breath, wheezing and stridor.  Cardiovascular: Negative for chest pain, palpitations and leg swelling.  Gastrointestinal: Negative for nausea, vomiting, abdominal pain, diarrhea, constipation, blood in stool, abdominal distention and anal bleeding.  Genitourinary: Negative for dysuria, hematuria, flank pain and difficulty urinating.  Musculoskeletal: positive per hpi  Skin: Negative for color change, pallor, rash and wound.  Neurological: Negative for dizziness, tremors, weakness and light-headedness.  Hematological: Negative for adenopathy. Does not bruise/bleed easily.  Psychiatric/Behavioral: Negative for behavioral problems, confusion, sleep disturbance, dysphoric mood, decreased concentration and agitation.       Objective:   Physical Exam BP 137/79  Pulse 82  Temp(Src) 98.5 F (36.9 C) (Oral)  Ht 5' 6.5" (1.689 m)  Wt 150 lb (68.04 kg)   BMI 23.85 kg/m2  LMP 07/25/1998 Physical Exam  Constitutional: oriented to person, place, and time.  appears well-developed and well-nourished. No distress.  HENT:  Mouth/Throat: Oropharynx is clear and moist. No oropharyngeal exudate.  Cardiovascular: Normal rate, regular rhythm and normal heart sounds. Exam reveals no gallop and no friction rub.  No murmur heard.  Pulmonary/Chest: Effort normal and breath sounds normal. No respiratory distress.  no wheezes.  Abdominal: Soft. Bowel sounds are normal. exhibits no distension. There is no tenderness.  Lymphadenopathy:  no cervical adenopathy.  Neurological:  alert and oriented to person, place, and time.  Skin: Skin is warm and dry. No rash noted. No erythema.  Psychiatric: a normal mood and affect.  behavior is normal.       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 We will try to retreat ltbi with start a course of rifampin 300mg  BID x 4 months. Will check baseline CMP; will repeat in 2 wks. Repeat QTF. If she tolerates 2 months of therapy, can start biologics. Gave rx for zofran for anti-emetic associated SE of rifampin  rtc in 2 wk  Cc: devashwar and jenkins

## 2013-04-12 ENCOUNTER — Telehealth (INDEPENDENT_AMBULATORY_CARE_PROVIDER_SITE_OTHER): Payer: Self-pay | Admitting: General Surgery

## 2013-04-12 NOTE — Telephone Encounter (Signed)
Pt called in to post pone surgery /(cancel) due to taking new medication for her arthritis and TB.  PCP wants her to avoid sx while on 3 month regiment  Will call back in 3 months

## 2013-04-15 LAB — QUANTIFERON TB GOLD ASSAY (BLOOD)
Mitogen value: 0.41 IU/mL
Quantiferon Nil Value: 0.02 IU/mL
Quantiferon Tb Ag Minus Nil Value: 0.01 IU/mL
TB Ag value: 0.03 IU/mL

## 2013-04-16 ENCOUNTER — Encounter: Payer: Self-pay | Admitting: Internal Medicine

## 2013-04-16 DIAGNOSIS — M130 Polyarthritis, unspecified: Secondary | ICD-10-CM

## 2013-04-19 ENCOUNTER — Encounter: Payer: Self-pay | Admitting: Internal Medicine

## 2013-04-25 ENCOUNTER — Ambulatory Visit (INDEPENDENT_AMBULATORY_CARE_PROVIDER_SITE_OTHER): Payer: Medicare Other | Admitting: Internal Medicine

## 2013-04-25 ENCOUNTER — Encounter: Payer: Self-pay | Admitting: Internal Medicine

## 2013-04-25 ENCOUNTER — Telehealth: Payer: Self-pay | Admitting: *Deleted

## 2013-04-25 VITALS — BP 123/79 | HR 84 | Temp 98.2°F | Wt 144.0 lb

## 2013-04-25 DIAGNOSIS — R7612 Nonspecific reaction to cell mediated immunity measurement of gamma interferon antigen response without active tuberculosis: Secondary | ICD-10-CM

## 2013-04-25 NOTE — Telephone Encounter (Signed)
Pt has questions about lab results.  Would appreciate a phone call from the MD to discuss.

## 2013-04-25 NOTE — Progress Notes (Signed)
[facility] NOTE  RFV: follow up for initiation of LTBI treatment Subjective:    Patient ID: Rachel Duran, female    DOB: 02/26/1956, 57 y.o.   MRN: [id number]  HPI Rachel Duran is a 57yo F who was referred to [place] in order to see if she could be treated for LTBI infection since she was to be evaluated to start immunologic agents for arthritis. She was  reported to have positive quantiferon in the past. She had tried INH but did not tolerate the side effects. WE have started rifampin for her 2 weeks ago as well as given her anti-emetic but she states that she does has not tolerated the medication at all. She states that she realizes that since she is unable to tolerate the medicaiton, that her rheumatologist is unlikely to proceed with their original plan.  Interestingly, we had checked her QTF and she was in the indeterminant range.  Current Outpatient Prescriptions on File Prior to Visit  Medication Sig Dispense Refill  . acyclovir (ZOVIRAX) 800 MG tablet Take one-half tablet by  mouth daily  45 tablet  3  . ALPRAZolam (XANAX) 1 MG tablet Take 1 mg by mouth 3 (three) times daily as needed.        Marland Kitchen amLODipine (NORVASC) 5 MG tablet Take 5 mg by mouth daily.        Marland Kitchen amphetamine-dextroamphetamine (ADDERALL XR) 30 MG 24 hr capsule Take 1 capsule (30 mg total) by mouth every morning.  90 capsule  0  . ANALPRAM-HC 1-2.5 % rectal cream APPLY TO AFFECTED AREA RECTALLY 3 TIMES A DAY  30 g  4  . ARIPiprazole (ABILIFY) 10 MG tablet Take 10 mg by mouth at bedtime.       Marland Kitchen aspirin 81 MG tablet Take 81 mg by mouth daily.        . baclofen (LIORESAL) 10 MG tablet TAKE 1 TABLET THREE TIMES A DAY  270 tablet  2  . Calcium Carbonate-Vitamin D (CALCIUM-VITAMIN D) 500-200 MG-UNIT per tablet Take 1 tablet by mouth 3 (three) times daily with meals.        . Cholecalciferol (D 400 PO) Take by mouth.        . co-enzyme Q-10 50 MG capsule Take 50 mg by mouth daily.      . cyanocobalamin (,VITAMIN B-12,) 1000 MCG/ML  injection Inject 1ml ( ) into the muscle every 30 days  10 mL  0  . dexlansoprazole (DEXILANT) 60 MG capsule Take 1 capsule (60 mg total) by mouth daily.  90 capsule  3  . diclofenac sodium (VOLTAREN) 1 % GEL Apply 1 g topically as needed.      . Estradiol (VAGIFEM) 10 MCG TABS Place 1 tablet (10 mcg total) vaginally 2 (two) times a week.  24 tablet  3  . Eszopiclone (ESZOPICLONE) 3 MG TABS Take 1 tablet (3 mg total) by mouth at bedtime. Take immediately before bedtime  30 tablet  3  . furosemide (LASIX) 20 MG tablet Take 1 tablet (20 mg total) by mouth daily.  30 tablet  3  . hydrocortisone (ANUSOL-HC) 25 MG suppository Place 1 suppository (25 mg total) rectally 2 (two) times daily.  12 suppository  5  . lamoTRIgine (LAMICTAL) 150 MG tablet Take 150 mg by mouth 2 (two) times daily.        Marland Kitchen lubiprostone (AMITIZA) 24 MCG capsule TAKE 2 CAPSULEs BY MOUTH ONCE DAILY WITH BREAKFAST  60 capsule  11  . Manganese 50  MG TABS Take 1 tablet by mouth daily.      Marland Kitchen morphine (AVINZA) 120 MG 24 hr capsule Take 1 capsule (120 mg total) by mouth daily.  30 capsule  0  . morphine (MSIR) 15 MG tablet Take 1 tablet (15 mg total) by mouth as needed.  30 tablet  0  . ondansetron (ZOFRAN) 4 MG tablet Take 1 tablet (4 mg total) by mouth every 8 (eight) hours as needed.  30 tablet  6  . OVER THE COUNTER MEDICATION Take 1,000 mg by mouth 3 (three) times daily. Calcium Plain OTC      . pentosan polysulfate (ELMIRON) 100 MG capsule Take 100 mg by mouth. 2 by mouth twice a day        . Polyethylene Glycol 3350 (MIRALAX PO) Take 1 scoop by mouth. Take 1scoop 3 to 5 times daily      . pregabalin (LYRICA) 150 MG capsule Take 150 mg by mouth 3 (three) times daily.        . rifampin (RIFADIN) 300 MG capsule Take 1 capsule (300 mg total) by mouth 2 (two) times daily.  60 capsule  3  . thiamine 100 MG tablet Take 100 mg by mouth daily.      . Triamcinolone Acetonide (NASACORT AQ NA) 2 sprays by Nasal route daily.        .  [DISCONTINUED] [facility] SLOW RELEASE IRON 143 (45 FE) MG TBCR TAKE 1 TABLET EVERY DAY  30 tablet  0   No current facility-administered medications on file prior to visit.   Active Ambulatory Problems    Diagnosis Date Noted  . 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 ANEMIA OF CHRONIC DISEASE 03/17/2009  . INSOMNIA, CHRONIC 11/09/2007  . DEPRESSION 03/05/2007  . DISORDERS, ORGNC INSOMNIA D/T MENTAL DISORDER 03/05/2007  . [name] SYNDROME 03/05/2007  . ALLERGIC RHINITIS 04/12/2007  . APHTHOUS ULCERS 01/31/2008  . GERD 03/05/2007  . CONSTIPATION, DRUG INDUCED 10/11/2007  . CYSTITIS, CHRONIC INTERSTITIAL 12/06/2006  . ROSACEA 03/05/2007  . OSTEOARTHRITIS 03/05/2007  . NECK PAIN, CHRONIC 10/01/2009  . CERVICAL RADICULOPATHY 05/28/2010  . UNSPECIFIED SYNOVITIS AND TENOSYNOVITIS 04/30/2009  . FIBROMYALGIA, SEVERE 12/06/2006  . NEURALGIA 03/05/2007  . CRAMP IN LIMB 02/28/2008  . SYMPTOM, MALAISE AND FATIGUE NEC 03/05/2007  . CHEST PAIN UNSPECIFIED 02/28/2008  . Varicose veins of lower extremities with other complications 09/05/2011  . 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 Nonspecific (abnormal) findings on radiological and other examination of biliary tract 05/22/2012  . Abdominal pain, right upper quadrant 05/22/2012  . Postmenopausal atrophic vaginitis 10/19/2012  . Internal and external bleeding hemorrhoids 01/30/2013   Resolved Ambulatory Problems    Diagnosis Date Noted  . Acute cystitis 10/15/2009  . Abnormal weight gain 01/31/2008  . WEIGHT LOSS, ABNORMAL 12/30/2008  . ABDOMINAL PAIN RIGHT LOWER QUADRANT 09/06/2007  . Nonspecific elevation of levels of transaminase or lactic acid dehydrogenase (LDH) 05/22/2012   Past Medical History  Diagnosis Date  . Irritable bowel syndrome   . Fibromyalgia   . Arthritis   . GERD (gastroesophageal reflux disease)   . Depression   . Allergy   . Chronic pain   . Cholecystitis   . Internal hemorrhoids   . PONV (postoperative  nausea and vomiting)   . Interstitial cystitis 06-06-12  . Raynauds disease   . Cervical disc disease   . Anemia        Review of Systems     Objective:   Physical Exam  Assessment & Plan:  Unable to tolerate taking rifampin. Wants to stop LTBI. Understand that she won't be able to do immunologics for RA like arthritis.   rtc prn

## 2013-05-06 ENCOUNTER — Telehealth: Payer: Self-pay | Admitting: Internal Medicine

## 2013-05-06 MED ORDER — MORPHINE SULFATE ER BEADS 90 MG PO CP24: 90.0000 mg | ORAL_CAPSULE | Freq: Every day | ORAL | Status: DC

## 2013-05-06 MED ORDER — MORPHINE SULFATE 15 MG PO TABS: 15.0000 mg | ORAL_TABLET | ORAL | Status: DC | PRN

## 2013-05-06 NOTE — Telephone Encounter (Signed)
Pt needs new rx avinza  90 mg instead of 120 mg #30. Pt needs rxs for next 3 months

## 2013-05-06 NOTE — Telephone Encounter (Signed)
Printed and will call pt to pikc up after dr Lovell Sheehan sign

## 2013-05-30 ENCOUNTER — Other Ambulatory Visit: Payer: Self-pay

## 2013-06-02 ENCOUNTER — Other Ambulatory Visit: Payer: Self-pay | Admitting: Internal Medicine

## 2013-06-03 ENCOUNTER — Telehealth: Payer: Self-pay | Admitting: Internal Medicine

## 2013-06-03 ENCOUNTER — Other Ambulatory Visit: Payer: Self-pay | Admitting: *Deleted

## 2013-06-03 DIAGNOSIS — F988 Other specified behavioral and emotional disorders with onset usually occurring in childhood and adolescence: Secondary | ICD-10-CM

## 2013-06-03 MED ORDER — AMPHETAMINE-DEXTROAMPHET ER 30 MG PO CP24: 30.0000 mg | ORAL_CAPSULE | ORAL | Status: DC

## 2013-06-03 NOTE — Telephone Encounter (Signed)
Printed and will call pt to pick up after dr Lovell Sheehan sigs

## 2013-06-03 NOTE — Telephone Encounter (Signed)
Pt needs generic adderall xr 30 mg #90 for [employer]

## 2013-06-04 ENCOUNTER — Other Ambulatory Visit: Payer: Self-pay | Admitting: *Deleted

## 2013-06-09 ENCOUNTER — Other Ambulatory Visit (INDEPENDENT_AMBULATORY_CARE_PROVIDER_SITE_OTHER): Payer: Self-pay | Admitting: General Surgery

## 2013-06-10 NOTE — Telephone Encounter (Signed)
Please advise if okay to refill. 

## 2013-06-10 NOTE — Telephone Encounter (Signed)
Ok to refill with 1 additional refill

## 2013-06-16 ENCOUNTER — Encounter: Payer: Self-pay | Admitting: Internal Medicine

## 2013-06-19 ENCOUNTER — Other Ambulatory Visit (INDEPENDENT_AMBULATORY_CARE_PROVIDER_SITE_OTHER): Payer: Self-pay | Admitting: General Surgery

## 2013-06-19 NOTE — Telephone Encounter (Signed)
Anusol HC 25 mg rectal suppository request received via fax and faxed back to [facility] in [place] with two additional refills.

## 2013-06-23 ENCOUNTER — Encounter: Payer: Self-pay | Admitting: Internal Medicine

## 2013-06-28 ENCOUNTER — Other Ambulatory Visit: Payer: Self-pay | Admitting: *Deleted

## 2013-06-28 ENCOUNTER — Encounter: Payer: Self-pay | Admitting: Internal Medicine

## 2013-06-28 DIAGNOSIS — K219 Gastro-esophageal reflux disease without esophagitis: Secondary | ICD-10-CM

## 2013-06-28 MED ORDER — PANTOPRAZOLE SODIUM 40 MG PO TBEC: 40.0000 mg | DELAYED_RELEASE_TABLET | Freq: Every day | ORAL | Status: DC

## 2013-07-08 ENCOUNTER — Telehealth: Payer: Self-pay | Admitting: Internal Medicine

## 2013-07-08 ENCOUNTER — Encounter: Payer: Self-pay | Admitting: Internal Medicine

## 2013-07-08 NOTE — Telephone Encounter (Signed)
See if there is a form to fill out for this

## 2013-07-08 NOTE — Telephone Encounter (Signed)
[employer] called in reference to a tier exception for this patient. [employer] rep states that Dr. Lovell Sheehan needs to call the [phone] number to have that done. Please advise.

## 2013-07-09 NOTE — Telephone Encounter (Signed)
Prior Auth/Tier Forms have been printed from website for Lunesta 3mg , and Protonix 40mg .  The pt also wants her Avinza 90 mg to be approved as well however I did not see it as an option on the webpage.

## 2013-07-11 NOTE — Telephone Encounter (Signed)
Pt states that [facility] asked for doctor  to call for tier exception approval for  protonix 40 mg. lunesta  and avinza 90g. Pt states if you could call today, they will approved. [phone]

## 2013-07-12 NOTE — Telephone Encounter (Signed)
Updated patient on tier exception status.  Prior auth sent off by Archie Patten, just waiting to hear back from insurance company. Pt notified and verbalized understanding.

## 2013-07-14 ENCOUNTER — Encounter: Payer: Self-pay | Admitting: Internal Medicine

## 2013-07-15 ENCOUNTER — Other Ambulatory Visit: Payer: Self-pay | Admitting: *Deleted

## 2013-07-15 MED ORDER — CYANOCOBALAMIN 1000 MCG/ML IJ SOLN: INTRAMUSCULAR | Status: DC

## 2013-08-02 ENCOUNTER — Encounter: Payer: Self-pay | Admitting: Internal Medicine

## 2013-08-02 ENCOUNTER — Other Ambulatory Visit: Payer: Self-pay | Admitting: *Deleted

## 2013-08-02 ENCOUNTER — Other Ambulatory Visit (INDEPENDENT_AMBULATORY_CARE_PROVIDER_SITE_OTHER): Payer: Medicare Other | Admitting: *Deleted

## 2013-08-02 ENCOUNTER — Ambulatory Visit (INDEPENDENT_AMBULATORY_CARE_PROVIDER_SITE_OTHER): Payer: Medicare Other | Admitting: Internal Medicine

## 2013-08-02 VITALS — BP 140/80 | HR 88 | Temp 98.0°F | Resp 19 | Ht 66.5 in | Wt 144.0 lb

## 2013-08-02 DIAGNOSIS — K5901 Slow transit constipation: Secondary | ICD-10-CM

## 2013-08-02 DIAGNOSIS — K2990 Gastroduodenitis, unspecified, without bleeding: Secondary | ICD-10-CM

## 2013-08-02 DIAGNOSIS — Z23 Encounter for immunization: Secondary | ICD-10-CM

## 2013-08-02 DIAGNOSIS — K2970 Gastritis, unspecified, without bleeding: Secondary | ICD-10-CM

## 2013-08-02 DIAGNOSIS — K219 Gastro-esophageal reflux disease without esophagitis: Secondary | ICD-10-CM

## 2013-08-02 MED ORDER — MORPHINE SULFATE ER BEADS 90 MG PO CP24: 90.0000 mg | ORAL_CAPSULE | Freq: Every day | ORAL | Status: DC

## 2013-08-02 MED ORDER — ESOMEPRAZOLE MAGNESIUM 40 MG PO CPDR: 40.0000 mg | DELAYED_RELEASE_CAPSULE | Freq: Two times a day (BID) | ORAL | Status: DC

## 2013-08-02 NOTE — Progress Notes (Signed)
Subjective:    Patient ID: Rachel Duran, female    DOB: [birth year], 58 y.o.   MRN: [id number]  HPI The patient has been stable on avinza but it has moved to the 3rd tier. She has failed most classes of pain medications due to constipation and slow transit and specifically has failed the IR and 12 hour mophines.  She has noted increased insomnia She has marked increase in her GERD and gastritis symptoms with hx of PUD     Review of Systems  Constitutional: Positive for activity change, appetite change and fatigue.  Gastrointestinal: Positive for nausea and constipation.  Musculoskeletal: Positive for arthralgias and myalgias.  Neurological: Positive for weakness.  Psychiatric/Behavioral: The patient is nervous/anxious.    Past Medical History  Diagnosis Date  . Disorders of porphyrin metabolism   . Irritable bowel syndrome   . Fibromyalgia   . Arthritis   . GERD (gastroesophageal reflux disease)   . Depression   . Allergy   . Chronic pain   . Cholecystitis   . Internal hemorrhoids   . PONV (postoperative nausea and vomiting)   . Interstitial cystitis 06-06-12    hx.  . Raynauds disease     hx.  . Cervical disc disease     hx. C6- C7 -hx. past fusion(bone graft used)  . Anemia     History   Social History  . Marital Status: Married    Spouse Name: N/A    Number of Children: N/A  . Years of Education: N/A   Occupational History  . retired    Social History Main Topics  . Smoking status: Never Smoker   . Smokeless tobacco: Never Used  . Alcohol Use: No  . Drug Use: No  . Sexual Activity: Yes    Partners: Male   Other Topics Concern  . Not on file   Social History Narrative  . No narrative on file    Past Surgical History  Procedure Laterality Date  . Cervical fusion    . Cervical lamination    . Cystoscopy    . Nasal sinus surgery    . Gyn surgery    . Hysteroscopy with ablation    . Breast enhancement surgery  2010  . Nasal sinus surgery       x5  . Ercp  05/22/2012    Procedure: ENDOSCOPIC RETROGRADE CHOLANGIOPANCREATOGRAPHY (ERCP);  Surgeon: [name], MD,FACG;  Location: [facility];  Service: Endoscopy;  Laterality: N/A;  . Cesarean section    . Hernia repair      inguinal  . Cholecystectomy  06/11/2012    Procedure: LAPAROSCOPIC CHOLECYSTECTOMY WITH INTRAOPERATIVE CHOLANGIOGRAM;  Surgeon: [name], MD,FACS;  Location: [facility];  Service: General;  Laterality: N/A;    Family History  Problem Relation Age of Onset  . Heart disease Father   . Esophageal cancer Father   . Cancer Father   . Scleroderma Mother   . Hypertension Mother   . Cancer Paternal Grandfather     esophageal & stomach  . Diabetes Maternal Grandmother     Allergies  Allergen Reactions  . Codeine Phosphate Nausea And Vomiting    Severe n&v  . Cymbalta [Duloxetine Hcl]     Urinary retention  . Inh [Isoniazid]     Hepatitis  . Meperidine Hcl Rash    REACTION: red skin  . Sulfamethoxazole Rash    Current Outpatient Prescriptions on File Prior to Visit  Medication Sig Dispense Refill  .  acyclovir (ZOVIRAX) 800 MG tablet Take one-half tablet by  mouth daily  45 tablet  3  . ALPRAZolam (XANAX) 1 MG tablet Take 1 mg by mouth 3 (three) times daily as needed.        Marland Kitchen amLODipine (NORVASC) 5 MG tablet Take 5 mg by mouth daily.        Marland Kitchen amphetamine-dextroamphetamine (ADDERALL XR) 30 MG 24 hr capsule Take 1 capsule (30 mg total) by mouth every morning.  90 capsule  0  . ANALPRAM-HC 1-2.5 % rectal cream APPLY TO AFFECTED AREA RECTALLY 3 TIMES A DAY  30 g  4  . ANUCORT-HC 25 MG suppository INSERT ONE SUPPOSITORY RECTALLY TWICE DAILY   12 suppository  4  . ARIPiprazole (ABILIFY) 10 MG tablet Take 10 mg by mouth at bedtime.       Marland Kitchen aspirin 81 MG tablet Take 81 mg by mouth daily.        . baclofen (LIORESAL) 10 MG tablet Take 1 tablet three times a day  270 each  1  . Calcium Carbonate-Vitamin D (CALCIUM-VITAMIN D) 500-200 MG-UNIT per tablet  Take 1 tablet by mouth 3 (three) times daily with meals.        . Cholecalciferol (D 400 PO) Take by mouth.        . co-enzyme Q-10 50 MG capsule Take 50 mg by mouth daily.      . cyanocobalamin (,VITAMIN B-12,) 1000 MCG/ML injection Inject 1 ml every 3 weeks  10 mL  3  . diclofenac sodium (VOLTAREN) 1 % GEL Apply 1 g topically as needed.      . Estradiol (VAGIFEM) 10 MCG TABS Place 1 tablet (10 mcg total) vaginally 2 (two) times a week.  24 tablet  3  . Eszopiclone (ESZOPICLONE) 3 MG TABS Take 1 tablet (3 mg total) by mouth at bedtime. Take immediately before bedtime  30 tablet  3  . lamoTRIgine (LAMICTAL) 150 MG tablet Take 150 mg by mouth 2 (two) times daily.        Marland Kitchen lubiprostone (AMITIZA) 24 MCG capsule TAKE 2 CAPSULEs BY MOUTH ONCE DAILY WITH BREAKFAST  60 capsule  11  . Manganese 50 MG TABS Take 1 tablet by mouth daily.      Marland Kitchen morphine (AVINZA) 90 MG 24 hr capsule Take 1 capsule (90 mg total) by mouth daily.  30 capsule  0  . morphine (MSIR) 15 MG tablet Take 1 tablet (15 mg total) by mouth as needed.  30 tablet  0  . ondansetron (ZOFRAN) 4 MG tablet Take 1 tablet (4 mg total) by mouth every 8 (eight) hours as needed.  30 tablet  6  . OVER THE COUNTER MEDICATION Take 1,000 mg by mouth 3 (three) times daily. Calcium Plain OTC      . pantoprazole (PROTONIX) 40 MG tablet Take 1 tablet (40 mg total) by mouth daily.  90 tablet  3  . pentosan polysulfate (ELMIRON) 100 MG capsule Take 100 mg by mouth. 2 by mouth twice a day        . Polyethylene Glycol 3350 (MIRALAX PO) Take 1 scoop by mouth. Take 1scoop 3 to 5 times daily      . pregabalin (LYRICA) 150 MG capsule Take 150 mg by mouth 3 (three) times daily.        . rifampin (RIFADIN) 300 MG capsule Take 1 capsule (300 mg total) by mouth 2 (two) times daily.  60 capsule  3  . thiamine 100 MG  tablet Take 100 mg by mouth daily.      . Triamcinolone Acetonide (NASACORT AQ NA) 2 sprays by Nasal route daily.        . [DISCONTINUED] [facility] SLOW RELEASE  IRON 143 (45 FE) MG TBCR TAKE 1 TABLET EVERY DAY  30 tablet  0   No current facility-administered medications on file prior to visit.    BP 140/80  Pulse 88  Temp(Src) 98 F (36.7 C)  Resp 19  Ht 5' 6.5" (1.689 m)  Wt 144 lb (65.318 kg)  BMI 22.90 kg/m2  LMP 07/25/1998       Objective:   Physical Exam  Nursing note and vitals reviewed. Constitutional: She is oriented to person, place, and time. She appears well-developed and well-nourished. No distress.  HENT:  Head: Normocephalic and atraumatic.  Eyes: Conjunctivae and EOM are normal. Pupils are equal, round, and reactive to light.  Neck: Normal range of motion. Neck supple. No JVD present. No tracheal deviation present. No thyromegaly present.  Cardiovascular: Normal rate, regular rhythm, normal heart sounds and intact distal pulses.   No murmur heard. Pulmonary/Chest: Effort normal and breath sounds normal. She has no wheezes. She exhibits no tenderness.  Abdominal: Soft. Bowel sounds are normal.  Musculoskeletal: She exhibits edema and tenderness.  multijoint pain  Lymphadenopathy:    She has no cervical adenopathy.  Neurological: She is alert and oriented to person, place, and time. She has normal reflexes. No cranial nerve deficit.  Skin: Skin is warm and dry. She is not diaphoretic.  Psychiatric: She has a normal mood and affect. Her behavior is normal.          Assessment & Plan:  Formularyexception for a Avinza completed for pain control. Recommend twice daily trial of Nexium for worsening GERD with symptomatic gastritis and history of PUD.  If failed the EGD and PH studies Section of trial of Restoril for insomnia She has been given an rx and we dicussed the timing of the medications for less side efects.  Linzess vs Amitiza for slow transit. She is in miralax and amitiza   Trial of linzess given tyitrate to BID then 290 one a day  Ut ot bid

## 2013-08-02 NOTE — Patient Instructions (Signed)
Trial of linzess  145 one a day and stop the amitiza but continue the miralax As you gradually increase the linzess to BID back off on the miralax  You may have mild cramping at first ... Do not stop for this  If you have loose stools then back off quickly on the miralax  Keep on the fiber

## 2013-08-02 NOTE — Progress Notes (Signed)
Pre visit review using our clinic review tool, if applicable. No additional management support is needed unless otherwise documented below in the visit note. 

## 2013-08-12 ENCOUNTER — Telehealth: Payer: Self-pay | Admitting: Internal Medicine

## 2013-08-12 NOTE — Telephone Encounter (Signed)
Left message for patient notifying her the tier exception was denied for Avinza. She will have to continue to pay the tier 3 co-pay.

## 2013-08-16 ENCOUNTER — Encounter: Payer: Self-pay | Admitting: Internal Medicine

## 2013-08-16 DIAGNOSIS — K219 Gastro-esophageal reflux disease without esophagitis: Secondary | ICD-10-CM

## 2013-08-28 ENCOUNTER — Other Ambulatory Visit (INDEPENDENT_AMBULATORY_CARE_PROVIDER_SITE_OTHER): Payer: Medicare Other

## 2013-08-28 ENCOUNTER — Encounter: Payer: Self-pay | Admitting: Gastroenterology

## 2013-08-28 ENCOUNTER — Ambulatory Visit (INDEPENDENT_AMBULATORY_CARE_PROVIDER_SITE_OTHER): Payer: Medicare Other | Admitting: Gastroenterology

## 2013-08-28 VITALS — BP 116/70 | HR 80 | Ht 65.0 in | Wt 142.0 lb

## 2013-08-28 DIAGNOSIS — R1013 Epigastric pain: Secondary | ICD-10-CM

## 2013-08-28 DIAGNOSIS — K5900 Constipation, unspecified: Secondary | ICD-10-CM

## 2013-08-28 DIAGNOSIS — K644 Residual hemorrhoidal skin tags: Secondary | ICD-10-CM

## 2013-08-28 DIAGNOSIS — K648 Other hemorrhoids: Secondary | ICD-10-CM

## 2013-08-28 LAB — CBC WITH DIFFERENTIAL/PLATELET
BASOS PCT: 0.9 % (ref 0.0–3.0)
Basophils Absolute: 0.1 10*3/uL (ref 0.0–0.1)
Eosinophils Absolute: 0.3 10*3/uL (ref 0.0–0.7)
Eosinophils Relative: 3.8 % (ref 0.0–5.0)
HCT: 38.4 % (ref 36.0–46.0)
Hemoglobin: 12.6 g/dL (ref 12.0–15.0)
LYMPHS ABS: 2.8 10*3/uL (ref 0.7–4.0)
LYMPHS PCT: 39.2 % (ref 12.0–46.0)
MCHC: 32.8 g/dL (ref 30.0–36.0)
MCV: 89.2 fl (ref 78.0–100.0)
MONOS PCT: 5.8 % (ref 3.0–12.0)
Monocytes Absolute: 0.4 10*3/uL (ref 0.1–1.0)
Neutro Abs: 3.6 10*3/uL (ref 1.4–7.7)
Neutrophils Relative %: 50.3 % (ref 43.0–77.0)
Platelets: 354 10*3/uL (ref 150.0–400.0)
RBC: 4.3 Mil/uL (ref 3.87–5.11)
RDW: 13.1 % (ref 11.5–14.6)
WBC: 7.2 10*3/uL (ref 4.5–10.5)

## 2013-08-28 LAB — COMPREHENSIVE METABOLIC PANEL
ALT: 26 U/L (ref 0–35)
AST: 23 U/L (ref 0–37)
Albumin: 4 g/dL (ref 3.5–5.2)
Alkaline Phosphatase: 253 U/L — ABNORMAL HIGH (ref 39–117)
BILIRUBIN TOTAL: 0.4 mg/dL (ref 0.3–1.2)
BUN: 8 mg/dL (ref 6–23)
CALCIUM: 9.3 mg/dL (ref 8.4–10.5)
CHLORIDE: 102 meq/L (ref 96–112)
CO2: 32 mEq/L (ref 19–32)
CREATININE: 0.5 mg/dL (ref 0.4–1.2)
GFR: 127.02 mL/min (ref >60.00)
Glucose, Bld: 94 mg/dL (ref 70–99)
Potassium: 4.4 mEq/L (ref 3.5–5.1)
Sodium: 140 mEq/L (ref 135–145)
Total Protein: 6.9 g/dL (ref 6.0–8.3)

## 2013-08-28 LAB — LIPASE: Lipase: 18 U/L (ref 11.0–59.0)

## 2013-08-28 NOTE — Patient Instructions (Signed)
Your physician has requested that you go to the basement for the following lab work before leaving today: Cmet, Lipase, and CBC.  You have been scheduled for an endoscopy with propofol. Please follow written instructions given to you at your visit today. If you use inhalers (even only as needed), please bring them with you on the day of your procedure. Your physician has requested that you go to www.startemmi.com and enter the access code given to you at your visit today. This web site gives a general overview about your procedure. However, you should still follow specific instructions given to you by our office regarding your preparation for the procedure.  Thank you for choosing me and [facility].  Pricilla Riffle. Dagoberto Ligas., MD., Marval Regal

## 2013-08-28 NOTE — Progress Notes (Signed)
    History of Present Illness: This is a 57-year-old female who has a long history of GERD managed with several PPIs over the years most recently Nexium 40 mg twice daily. Over the past few months she has had recurrent episodes of severe epigastric pain radiating straight through to her back. They are occasionally associated with meals but frequently occur at other times such as in the middle of the night. She states taking additional and acids has helped. She is status post cholecystectomy and status post ERCP with bile duct stone extraction 2013. She has chronic constipation that is currently under good control with Linzess, MiraLax and fiber supplements. LFTs in September were normal. She has frequent hemorrhoid symptoms and uses suppositories frequently to manage her symptoms. Surgical management is planned by Dr. Wilson.  Current Medications, Allergies, Past Medical History, Past Surgical History, Family History and Social History were reviewed in [facility] electronic medical record.  Physical Exam: General: Well developed , well nourished, no acute distress Head: Normocephalic and atraumatic Eyes:  sclerae anicteric, EOMI Ears: Normal auditory acuity Mouth: No deformity or lesions Lungs: Clear throughout to auscultation Heart: Regular rate and rhythm; no murmurs, rubs or bruits Abdomen: Soft, mild epigastric and xiphoid area tenderness and non distended. No masses, hepatosplenomegaly or hernias noted. Normal Bowel sounds Musculoskeletal: Symmetrical with no gross deformities  Pulses:  Normal pulses noted Extremities: No clubbing, cyanosis, edema or deformities noted Neurological: Alert oriented x 4, grossly nonfocal Psychological:  Alert and cooperative. Normal mood and affect  Assessment and Recommendations:  1. Episodic epigastric/xiphoid pain radiating to back. R/O GERD, musculoskeletal symptoms. CMET, CBC, lipase today. Continue Nexium 40 mg twice a day for now. TUMS as  needed. Schedule EGD. The risks, benefits, and alternatives to endoscopy with possible biopsy and possible dilation were discussed with the patient and they consent to proceed.   2. Internal and external hemorrhoids. Followed by Dr. Wilson.   3. Chronic constipation. Continue Linzess, Miralax and fiber supplements.  4. CRC screening, average risk. Colonoscopy in 11/2016. Flex sig/ACBE in 2008 showed only internal hemorrhoids. 

## 2013-08-29 ENCOUNTER — Other Ambulatory Visit: Payer: Self-pay

## 2013-08-29 ENCOUNTER — Encounter: Payer: Self-pay | Admitting: Gastroenterology

## 2013-08-29 DIAGNOSIS — R7401 Elevation of levels of liver transaminase levels: Secondary | ICD-10-CM

## 2013-08-29 DIAGNOSIS — R740 Nonspecific elevation of levels of transaminase and lactic acid dehydrogenase [LDH]: Principal | ICD-10-CM

## 2013-08-30 ENCOUNTER — Ambulatory Visit (INDEPENDENT_AMBULATORY_CARE_PROVIDER_SITE_OTHER): Payer: Medicare Other | Admitting: General Surgery

## 2013-08-30 ENCOUNTER — Encounter (INDEPENDENT_AMBULATORY_CARE_PROVIDER_SITE_OTHER): Payer: Self-pay | Admitting: General Surgery

## 2013-08-30 VITALS — BP 110/72 | HR 92 | Temp 98.4°F | Resp 14 | Ht 66.0 in | Wt 141.6 lb

## 2013-08-30 DIAGNOSIS — K644 Residual hemorrhoidal skin tags: Secondary | ICD-10-CM

## 2013-08-30 DIAGNOSIS — K648 Other hemorrhoids: Secondary | ICD-10-CM

## 2013-08-30 MED ORDER — HYDROCORTISONE ACETATE 25 MG RE SUPP: RECTAL | Status: DC

## 2013-08-30 NOTE — Patient Instructions (Signed)
Anal Pruritus Anal pruritus is an itching of the anus, which is often due to increased moisture of the skin around the anus. Moisture may be due to sweating or a small amount of remaining stool. The itching and scratching can cause further skin damage.  CAUSES   Poor hygiene.  Excessive moisture from sweating or residual stool in the anal area.  Perfumed soaps and sprays and colored toilet paper.  Chemicals in the foods you eat.  Dietary factors such as caffeine, beer, milk products, chocolate, nuts, citrus fruits, tomatoes, spicy seasonings, jalapeno peppers, and salsa.  Hemorrhoids, infections, and other anal diseases.  Excessive washing.  Overuse of laxatives.  Skin disorders (psoriasis, eczema, or seborrhea). HOME CARE INSTRUCTIONS   Practice good hygiene.  Clean the anal area gently with wet toilet paper, baby wipes, or a wet washcloth after every bowel movement and at bedtime. Avoid using soaps on the anal area. Dry the area thoroughly. Pat the area dry with toilet paper or a towel.  Do not scrub the anal area with anything, even toilet paper.  Try not to scratch the itchy area. Scratching produces more damage, which makes the itching worse.  Take sitz baths in warm water for 15 to 20 minutes, 2 to 3 times a day. Pat the area dry with a soft cloth after each bath.  Zinc oxide ointment or a moisture barrier cream can be applied several times daily to protect the skin.  Only take medicines as directed by your caregiver.  Talk to your caregiver about fiber supplements. These are helpful in normalizing the stool if you have frequent loose stools.  Wear cotton underwear and loose clothing.  Do not use irritants such as bubble baths, scented toilet paper, or genital deodorants. SEEK MEDICAL CARE IF:   Itching does not improve in several days or gets worse.  You have a fever.  There are problems with increased pain, swelling, or redness. MAKE SURE YOU:   Understand  these instructions.  Will watch your condition.  Will get help right away if you are not doing well or get worse. Document Released: 01/10/2011 Document Revised: 10/03/2011 Document Reviewed: 01/10/2011 [facility] Patient Information 2014 [place].

## 2013-08-30 NOTE — Progress Notes (Signed)
Subjective:     Patient ID: Rachel Duran, female   DOB: 1955-11-22, 58 y.o.   MRN: [id number]  HPI 58 year old Caucasian female comes in for long-term followup regarding her internal and external hemorrhoids. She had put off surgery back in the fall due to being on INH therapy. She states she is still having problems with hemorrhoids. She is having bowel movements daily. She is taking Benefiber and MiraLAX daily. She is drinking 5-6 glasses of water a day. She states that it is now hurting when she wipes after having a bowel movement. She also complains of itching and burning after having a bowel movement. She is using Tucks pads. She is also using wet wipes. She is also using Anucort suppositories. She is not applying any type of ointment to the perianal region.  Of note she states for the past 4-5 months she has had intermittent epigastric pain sometimes causing her to double over. She initially thought it was reflux and was treated medically with different medication such as protonic, dexilant, and Nexium. She didn't get any relief so she was referred back to Dr. Fuller Plan. They checked labs which revealed an elevated alkaline phosphatase. He has ordered an abdominal ultrasound to investigate for evidence of a potential retained common bile duct stone.  PMHx, PSHx, SOCHx, FAMHx, ALL reviewed   Review of Systems 10 point ROS performed and negative except for above    Objective:   Physical Exam  Vitals reviewed. Constitutional: She is oriented to person, place, and time. She appears well-developed and well-nourished. No distress.  HENT:  Head: Normocephalic and atraumatic.  Right Ear: External ear normal.  Left Ear: External ear normal.  Eyes: Conjunctivae are normal. No scleral icterus.  Neck: Normal range of motion. Neck supple. No tracheal deviation present. No thyromegaly present.  Cardiovascular: Normal rate and normal heart sounds.   Pulmonary/Chest: Effort normal and breath sounds  normal. No stridor. No respiratory distress. She has no wheezes.  Abdominal: Soft. She exhibits no distension. There is no tenderness. There is no rebound.  Genitourinary: Rectal exam shows no fissure and anal tone normal.  Redundant non-thrombosed ext hem tissue. In posterior and ant midline in perineal skin pt has dry, cracked, excoriated skin. Good tone. Anoscopy aborted due to large stool burden in rectal vault.  Musculoskeletal: She exhibits no edema and no tenderness.  Lymphadenopathy:    She has no cervical adenopathy.  Neurological: She is alert and oriented to person, place, and time. She exhibits normal muscle tone.  Skin: Skin is warm and dry. No rash noted. She is not diaphoretic. No erythema.  Psychiatric: She has a normal mood and affect. Her behavior is normal. Judgment and thought content normal.       Assessment:     Internal-external hemorrhoids multicolumn Pruritus ani     Plan:     It looks like the patient now has developed some components of pruritus ani. This is the first time that her perianal skin and perineum has been excoriated. We discussed pruritus ani. She was given Neurosurgeon. I did agree with Dr. Silvio Pate workup for isolated elevated alkaline phosphatase. She did have an ERCP With sphincterotomy preoperatively Prior to her cholecystectomy. Her prior alkaline phosphatase levels have been normal. It is possible she may have a retained common bile duct stone therefore I agree with the abdominal ultrasound  With respect to her hemorrhoids, she would like to proceed with exam under anesthesia, hemorrhoidal banding and possible excisional hemorrhoidectomy. we have  previously discussed surgery Including the risk and benefits. We did revisit the risk and benefits as well as the typical postoperative course. All of her questions were asked and answered. In the interim she'll start instituting treatment for the dry excoriated skin  Leighton Ruff. Redmond Pulling, MD,  FACS General, Bariatric, & Minimally Invasive Surgery [facility], [place]

## 2013-09-03 ENCOUNTER — Other Ambulatory Visit: Payer: Self-pay

## 2013-09-03 ENCOUNTER — Other Ambulatory Visit: Payer: Self-pay | Admitting: *Deleted

## 2013-09-03 ENCOUNTER — Telehealth: Payer: Self-pay | Admitting: Internal Medicine

## 2013-09-03 ENCOUNTER — Ambulatory Visit (HOSPITAL_COMMUNITY)
Admission: RE | Admit: 2013-09-03 | Discharge: 2013-09-03 | Disposition: A | Discharge status: Home / Self Care | Payer: Medicare Other | Source: Ambulatory Visit | Attending: Gastroenterology | Admitting: Gastroenterology

## 2013-09-03 ENCOUNTER — Other Ambulatory Visit: Payer: Medicare Other

## 2013-09-03 DIAGNOSIS — F988 Other specified behavioral and emotional disorders with onset usually occurring in childhood and adolescence: Secondary | ICD-10-CM

## 2013-09-03 DIAGNOSIS — K8020 Calculus of gallbladder without cholecystitis without obstruction: Secondary | ICD-10-CM | POA: Insufficient documentation

## 2013-09-03 DIAGNOSIS — R7989 Other specified abnormal findings of blood chemistry: Secondary | ICD-10-CM | POA: Insufficient documentation

## 2013-09-03 DIAGNOSIS — R7401 Elevation of levels of liver transaminase levels: Secondary | ICD-10-CM

## 2013-09-03 DIAGNOSIS — K8050 Calculus of bile duct without cholangitis or cholecystitis without obstruction: Secondary | ICD-10-CM

## 2013-09-03 DIAGNOSIS — R740 Nonspecific elevation of levels of transaminase and lactic acid dehydrogenase [LDH]: Secondary | ICD-10-CM

## 2013-09-03 MED ORDER — AMPHETAMINE-DEXTROAMPHET ER 30 MG PO CP24: 30.0000 mg | ORAL_CAPSULE | ORAL | Status: DC

## 2013-09-03 MED ORDER — CIPROFLOXACIN IN D5W 400 MG/200ML IV SOLN: 400.0000 mg | Freq: Once | INTRAVENOUS | Status: DC

## 2013-09-03 NOTE — Telephone Encounter (Signed)
Pt informed ready for pick up 

## 2013-09-03 NOTE — Telephone Encounter (Signed)
Pt is requesting a refill of her amphetamine-dextroamphetamine (ADDERALL XR) 30 MG 24 hr capsule. Please call when ready for pick up.

## 2013-09-04 ENCOUNTER — Encounter (HOSPITAL_COMMUNITY): Payer: Self-pay | Admitting: *Deleted

## 2013-09-04 ENCOUNTER — Telehealth: Payer: Self-pay | Admitting: Gastroenterology

## 2013-09-04 ENCOUNTER — Other Ambulatory Visit: Payer: Self-pay

## 2013-09-04 ENCOUNTER — Encounter (HOSPITAL_COMMUNITY): Payer: Self-pay | Admitting: Pharmacy Technician

## 2013-09-04 DIAGNOSIS — Z1231 Encounter for screening mammogram for malignant neoplasm of breast: Secondary | ICD-10-CM

## 2013-09-04 NOTE — Telephone Encounter (Signed)
Please cancel EGD in [place]. CBD stones are the apparent cause of her symptoms.

## 2013-09-04 NOTE — Telephone Encounter (Signed)
Patient is scheduled for ERCP on 2/24.  She was previously scheduled for EGD in [facility] 09/19/13.  Is the EGD still needed or can I cancel?

## 2013-09-04 NOTE — Telephone Encounter (Signed)
EGD canceled .  Patient's husband aware

## 2013-09-07 LAB — NUCLEOTIDASE, 5', BLOOD: 5-Nucleotidase: 40 U/L — ABNORMAL HIGH (ref <11)

## 2013-09-08 ENCOUNTER — Encounter: Payer: Self-pay | Admitting: Internal Medicine

## 2013-09-09 ENCOUNTER — Other Ambulatory Visit: Payer: Self-pay | Admitting: *Deleted

## 2013-09-09 MED ORDER — LINACLOTIDE 145 MCG PO CAPS: 290.0000 ug | ORAL_CAPSULE | Freq: Every morning | ORAL | Status: DC

## 2013-09-09 NOTE — Telephone Encounter (Signed)
It has been sent!

## 2013-09-13 ENCOUNTER — Other Ambulatory Visit: Payer: Self-pay | Admitting: *Deleted

## 2013-09-13 ENCOUNTER — Telehealth: Payer: Self-pay | Admitting: Gastroenterology

## 2013-09-13 MED ORDER — LINACLOTIDE 145 MCG PO CAPS: 290.0000 ug | ORAL_CAPSULE | Freq: Every morning | ORAL | Status: DC

## 2013-09-13 NOTE — Telephone Encounter (Signed)
Patient aware that ok for injection

## 2013-09-13 NOTE — Telephone Encounter (Signed)
Left message for patient to call back  

## 2013-09-17 ENCOUNTER — Encounter (HOSPITAL_COMMUNITY): Payer: Self-pay | Admitting: *Deleted

## 2013-09-17 ENCOUNTER — Encounter (HOSPITAL_COMMUNITY): Payer: Medicare Other | Admitting: Anesthesiology

## 2013-09-17 ENCOUNTER — Ambulatory Visit (HOSPITAL_COMMUNITY): Payer: Medicare Other | Admitting: Anesthesiology

## 2013-09-17 ENCOUNTER — Ambulatory Visit (HOSPITAL_COMMUNITY)
Admission: RE | Admit: 2013-09-17 | Discharge: 2013-09-17 | Disposition: A | Discharge status: Home / Self Care | Payer: Medicare Other | Source: Ambulatory Visit | Attending: Gastroenterology | Admitting: Gastroenterology

## 2013-09-17 ENCOUNTER — Encounter (HOSPITAL_COMMUNITY)
Admission: RE | Disposition: A | Discharge status: Home / Self Care | Payer: Self-pay | Source: Ambulatory Visit | Attending: Gastroenterology

## 2013-09-17 ENCOUNTER — Other Ambulatory Visit: Payer: Self-pay

## 2013-09-17 ENCOUNTER — Ambulatory Visit (HOSPITAL_COMMUNITY): Payer: Medicare Other

## 2013-09-17 DIAGNOSIS — K648 Other hemorrhoids: Secondary | ICD-10-CM | POA: Insufficient documentation

## 2013-09-17 DIAGNOSIS — Z79899 Other long term (current) drug therapy: Secondary | ICD-10-CM | POA: Insufficient documentation

## 2013-09-17 DIAGNOSIS — K838 Other specified diseases of biliary tract: Secondary | ICD-10-CM | POA: Insufficient documentation

## 2013-09-17 DIAGNOSIS — K219 Gastro-esophageal reflux disease without esophagitis: Secondary | ICD-10-CM | POA: Insufficient documentation

## 2013-09-17 DIAGNOSIS — K644 Residual hemorrhoidal skin tags: Secondary | ICD-10-CM | POA: Insufficient documentation

## 2013-09-17 DIAGNOSIS — R740 Nonspecific elevation of levels of transaminase and lactic acid dehydrogenase [LDH]: Principal | ICD-10-CM

## 2013-09-17 DIAGNOSIS — R932 Abnormal findings on diagnostic imaging of liver and biliary tract: Secondary | ICD-10-CM

## 2013-09-17 DIAGNOSIS — R7401 Elevation of levels of liver transaminase levels: Secondary | ICD-10-CM

## 2013-09-17 DIAGNOSIS — Z9089 Acquired absence of other organs: Secondary | ICD-10-CM | POA: Insufficient documentation

## 2013-09-17 DIAGNOSIS — IMO0001 Reserved for inherently not codable concepts without codable children: Secondary | ICD-10-CM | POA: Insufficient documentation

## 2013-09-17 DIAGNOSIS — R7989 Other specified abnormal findings of blood chemistry: Secondary | ICD-10-CM | POA: Insufficient documentation

## 2013-09-17 DIAGNOSIS — K8050 Calculus of bile duct without cholangitis or cholecystitis without obstruction: Secondary | ICD-10-CM | POA: Insufficient documentation

## 2013-09-17 DIAGNOSIS — R1011 Right upper quadrant pain: Secondary | ICD-10-CM

## 2013-09-17 DIAGNOSIS — K5900 Constipation, unspecified: Secondary | ICD-10-CM | POA: Insufficient documentation

## 2013-09-17 HISTORY — PX: ERCP: SHX5425

## 2013-09-17 HISTORY — DX: Unspecified abdominal pain: R10.9

## 2013-09-17 SURGERY — ERCP, WITH INTERVENTION IF INDICATED
Anesthesia: General

## 2013-09-17 MED ORDER — LIDOCAINE HCL (CARDIAC) 20 MG/ML IV SOLN
INTRAVENOUS | Status: DC | PRN
  Administered 2013-09-17: 50 mg via INTRAVENOUS

## 2013-09-17 MED ORDER — SUCCINYLCHOLINE CHLORIDE 20 MG/ML IJ SOLN
INTRAMUSCULAR | Status: DC | PRN
  Administered 2013-09-17: 100 mg via INTRAVENOUS

## 2013-09-17 MED ORDER — DEXAMETHASONE SODIUM PHOSPHATE 10 MG/ML IJ SOLN
INTRAMUSCULAR | Status: AC
  Filled 2013-09-17: qty 1

## 2013-09-17 MED ORDER — GLUCAGON HCL (RDNA) 1 MG IJ SOLR
INTRAMUSCULAR | Status: DC | PRN
  Administered 2013-09-17 (×3): 0.25 mg via INTRAVENOUS

## 2013-09-17 MED ORDER — ONDANSETRON HCL 4 MG/2ML IJ SOLN
INTRAMUSCULAR | Status: AC
  Filled 2013-09-17: qty 2

## 2013-09-17 MED ORDER — MIDAZOLAM HCL 2 MG/2ML IJ SOLN
INTRAMUSCULAR | Status: AC
  Filled 2013-09-17: qty 2

## 2013-09-17 MED ORDER — FENTANYL CITRATE 0.05 MG/ML IJ SOLN
INTRAMUSCULAR | Status: AC
  Filled 2013-09-17: qty 2

## 2013-09-17 MED ORDER — PROPOFOL 10 MG/ML IV BOLUS
INTRAVENOUS | Status: DC | PRN
  Administered 2013-09-17: 120 mg via INTRAVENOUS

## 2013-09-17 MED ORDER — MIDAZOLAM HCL 5 MG/5ML IJ SOLN
INTRAMUSCULAR | Status: DC | PRN
  Administered 2013-09-17: 2 mg via INTRAVENOUS

## 2013-09-17 MED ORDER — FENTANYL CITRATE 0.05 MG/ML IJ SOLN
INTRAMUSCULAR | Status: DC | PRN
  Administered 2013-09-17 (×2): 50 ug via INTRAVENOUS

## 2013-09-17 MED ORDER — LACTATED RINGERS IV SOLN
INTRAVENOUS | Status: DC
  Administered 2013-09-17: 10:00:00 via INTRAVENOUS

## 2013-09-17 MED ORDER — LACTATED RINGERS IV SOLN
INTRAVENOUS | Status: DC | PRN
  Administered 2013-09-17: 10:00:00 via INTRAVENOUS

## 2013-09-17 MED ORDER — LIDOCAINE HCL (CARDIAC) 20 MG/ML IV SOLN
INTRAVENOUS | Status: AC
  Filled 2013-09-17: qty 5

## 2013-09-17 MED ORDER — GLUCAGON HCL (RDNA) 1 MG IJ SOLR
INTRAMUSCULAR | Status: AC
  Filled 2013-09-17: qty 2

## 2013-09-17 MED ORDER — ONDANSETRON HCL 4 MG/2ML IJ SOLN
INTRAMUSCULAR | Status: DC | PRN
  Administered 2013-09-17: 4 mg via INTRAVENOUS

## 2013-09-17 MED ORDER — SODIUM CHLORIDE 0.9 % IV SOLN: INTRAVENOUS | Status: DC

## 2013-09-17 MED ORDER — PROPOFOL 10 MG/ML IV BOLUS
INTRAVENOUS | Status: AC
  Filled 2013-09-17: qty 20

## 2013-09-17 MED ORDER — IOHEXOL 350 MG/ML SOLN
INTRAVENOUS | Status: DC | PRN
  Administered 2013-09-17: 12:00:00

## 2013-09-17 MED ORDER — DEXAMETHASONE SODIUM PHOSPHATE 10 MG/ML IJ SOLN
INTRAMUSCULAR | Status: DC | PRN
  Administered 2013-09-17: 10 mg via INTRAVENOUS

## 2013-09-17 NOTE — H&P (View-Only) (Signed)
    History of Present Illness: This is a 58 year old female who has a long history of GERD managed with several PPIs over the years most recently Nexium 40 mg twice daily. Over the past few months she has had recurrent episodes of severe epigastric pain radiating straight through to her back. They are occasionally associated with meals but frequently occur at other times such as in the middle of the night. She states taking additional and acids has helped. She is status post cholecystectomy and status post ERCP with bile duct stone extraction 2013. She has chronic constipation that is currently under good control with Linzess, MiraLax and fiber supplements. LFTs in September were normal. She has frequent hemorrhoid symptoms and uses suppositories frequently to manage her symptoms. Surgical management is planned by Dr. Redmond Pulling.  Current Medications, Allergies, Past Medical History, Past Surgical History, Family History and Social History were reviewed in [employer] record.  Physical Exam: General: Well developed , well nourished, no acute distress Head: Normocephalic and atraumatic Eyes:  sclerae anicteric, EOMI Ears: Normal auditory acuity Mouth: No deformity or lesions Lungs: Clear throughout to auscultation Heart: Regular rate and rhythm; no murmurs, rubs or bruits Abdomen: Soft, mild epigastric and xiphoid area tenderness and non distended. No masses, hepatosplenomegaly or hernias noted. Normal Bowel sounds Musculoskeletal: Symmetrical with no gross deformities  Pulses:  Normal pulses noted Extremities: No clubbing, cyanosis, edema or deformities noted Neurological: Alert oriented x 4, grossly nonfocal Psychological:  Alert and cooperative. Normal mood and affect  Assessment and Recommendations:  1. Episodic epigastric/xiphoid pain radiating to back. R/O GERD, musculoskeletal symptoms. CMET, CBC, lipase today. Continue Nexium 40 mg twice a day for now. TUMS as  needed. Schedule EGD. The risks, benefits, and alternatives to endoscopy with possible biopsy and possible dilation were discussed with the patient and they consent to proceed.   2. Internal and external hemorrhoids. Followed by Dr. Redmond Pulling.   3. Chronic constipation. Continue Linzess, Miralax and fiber supplements.  4. CRC screening, average risk. Colonoscopy in 11/2016. Flex sig/ACBE in 2008 showed only internal hemorrhoids.

## 2013-09-17 NOTE — Anesthesia Postprocedure Evaluation (Signed)
  Anesthesia Post-op Note  Patient: Rachel Duran  Procedure(s) Performed: Procedure(s) (LRB): ENDOSCOPIC RETROGRADE CHOLANGIOPANCREATOGRAPHY (ERCP) (N/A)  Patient Location: PACU  Anesthesia Type: General  Level of Consciousness: awake and alert   Airway and Oxygen Therapy: Patient Spontanous Breathing  Post-op Pain: mild  Post-op Assessment: Post-op Vital signs reviewed, Patient's Cardiovascular Status Stable, Respiratory Function Stable, Patent Airway and No signs of Nausea or vomiting  Last Vitals:  Filed Vitals:   09/17/13 1245  BP: 145/88  Pulse:   Temp:   Resp: 16    Post-op Vital Signs: stable   Complications: No apparent anesthesia complications

## 2013-09-17 NOTE — Interval H&P Note (Signed)
History and Physical Interval Note:  09/17/2013 9:18 AM  Rachel Duran  has presented today for surgery, with the diagnosis of s/p cholecystectomy  common bile duct stone  The various methods of treatment have been discussed with the patient and family. After consideration of risks, benefits and other options for treatment, the patient has consented to  Procedure(s): ENDOSCOPIC RETROGRADE CHOLANGIOPANCREATOGRAPHY (ERCP) (N/A) as a surgical intervention .  The patient's history has been reviewed, patient examined, no change in status, stable for surgery.  I have reviewed the patient's chart and labs.  Questions were answered to the patient's satisfaction.     Pricilla Riffle. Fuller Plan MD

## 2013-09-17 NOTE — Op Note (Signed)
[facility] [place], [address]   ERCP PROCEDURE REPORT  PATIENT: Rachel Duran, Rachel Duran.  MR# :[id number] BIRTHDATE: 08/24/1955  GENDER: Female ENDOSCOPIST: [name], MD, [facility] REFERRED BY: PROCEDURE DATE:  09/17/2013 PROCEDURE:   ERCP with sphincterotomy/papillotomy and ERCP with removal of calculus/calculi ASA CLASS:   Class II INDICATIONS:abdominal pain of suspected biliary origin.   abnormal abdominal ultrasound.   abnormal liver function test . MEDICATIONS: General endotracheal anesthesia (GETA) TOPICAL ANESTHETIC: none DESCRIPTION OF PROCEDURE:   After the risks benefits and alternatives of the procedure were thoroughly explained, informed consent was obtained.  The     endoscope was introduced through the mouth  and advanced to the second portion of the duodenum .  1.  The ampulla was located in the second portion of the duodenum. There was evidence of prior sphincterotomy. 2.  With guidewire in the bile duct, a biliary sphincterotomy was performed using the sphincterotome. The sphinctertomy was extended after several attempts to clear the CBD were not successful. 3.  Two stones and sludge seen in the mid common bile duct. 4.  Using a stone extraction balloon the bile duct was swept several times.  Two stones abd sludge removed from the bile duct successfully. 5.  There was mild dilation of the common bile duct and prior cholecystectomy. Adequate biliary drainage was noted. The PD was not cannulated or injected by intention. The scope was then completely withdrawn from the patient and the procedurecompleted.     COMPLICATIONS:  ENDOSCOPIC IMPRESSION: 1.   Prior sphincterotomy noted, it was extended using the sphincterotome. 2.   Two stones and sludge in the mid common bile duct; stone extraction balloon with 2 stones and sludge removed from the bile duct successfully. 3.   Mild dilation of the common bile duct. 4.   Prior  cholecystectomy  RECOMMENDATIONS: 1.  liver enzymes in 1 month 2.  follow-up: office appointment in 1 month 3.  No ASA/NSAIDs for 7 days  eSigned:  [name], MD, [facility] 09/17/2013 12:01 PM

## 2013-09-17 NOTE — Anesthesia Preprocedure Evaluation (Addendum)
Anesthesia Evaluation  Patient identified by MRN, date of birth, ID band Patient awake    Reviewed: Allergy & Precautions, H&P , NPO status , Patient's Chart, lab work & pertinent test results  Airway Mallampati: II TM Distance: >3 FB Neck ROM: Limited    Dental no notable dental hx.    Pulmonary neg pulmonary ROS,  breath sounds clear to auscultation  Pulmonary exam normal       Cardiovascular negative cardio ROS  Rhythm:Regular Rate:Normal     Neuro/Psych Bipolar Disorder Chronic narcotic use    GI/Hepatic negative GI ROS, Neg liver ROS,   Endo/Other  negative endocrine ROS  Renal/GU negative Renal ROS  negative genitourinary   Musculoskeletal  (+) Fibromyalgia -  Abdominal   Peds negative pediatric ROS (+)  Hematology negative hematology ROS (+)   Anesthesia Other Findings   Reproductive/Obstetrics negative OB ROS                           Anesthesia Physical Anesthesia Plan  ASA: III  Anesthesia Plan: General   Post-op Pain Management:    Induction: Intravenous  Airway Management Planned: Oral ETT  Additional Equipment:   Intra-op Plan:   Post-operative Plan: Extubation in OR  Informed Consent: I have reviewed the patients History and Physical, chart, labs and discussed the procedure including the risks, benefits and alternatives for the proposed anesthesia with the patient or authorized representative who has indicated his/her understanding and acceptance.   Dental advisory given  Plan Discussed with: CRNA and Surgeon  Anesthesia Plan Comments:         Anesthesia Quick Evaluation

## 2013-09-17 NOTE — Preoperative (Signed)
Beta Blockers   Reason not to administer Beta Blockers:Not Applicable 

## 2013-09-17 NOTE — Transfer of Care (Signed)
Immediate Anesthesia Transfer of Care Note  Patient: Rachel Duran  Procedure(s) Performed: Procedure(s): ENDOSCOPIC RETROGRADE CHOLANGIOPANCREATOGRAPHY (ERCP) (N/A)  Patient Location: PACU  Anesthesia Type:General  Level of Consciousness: sedated, patient cooperative and responds to stimulation  Airway & Oxygen Therapy: Patient Spontanous Breathing and Patient connected to face mask oxygen  Post-op Assessment: Report given to PACU RN and Post -op Vital signs reviewed and stable  Post vital signs: Reviewed and stable  Complications: No apparent anesthesia complications

## 2013-09-17 NOTE — Discharge Instructions (Signed)
Monitored Anesthesia Care  °Monitored anesthesia care is an anesthesia service for a medical procedure. Anesthesia is the loss of the ability to feel pain. It is produced by medications called anesthetics. It may affect a small area of your body (local anesthesia), a large area of your body (regional anesthesia), or your entire body (general anesthesia). The need for monitored anesthesia care depends your procedure, your condition, and the potential need for regional or general anesthesia. It is often provided during procedures where:  °· General anesthesia may be needed if there are complications. This is because you need special care when you are under general anesthesia.   °· You will be under local or regional anesthesia. This is so that you are able to have higher levels of anesthesia if needed.   °· You will receive calming medications (sedatives). This is especially the case if sedatives are given to put you in a semi-conscious state of relaxation (deep sedation). This is because the amount of sedative needed to produce this state can be hard to predict. Too much of a sedative can produce general anesthesia. °Monitored anesthesia care is performed by one or more caregivers who have special training in all types of anesthesia. You will need to meet with these caregivers before your procedure. During this meeting, they will ask you about your medical history. They will also give you instructions to follow. (For example, you will need to stop eating and drinking before your procedure. You may also need to stop or change medications you are taking.) During your procedure, your caregivers will stay with you. They will:  °· Watch your condition. This includes watching you blood pressure, breathing, and level of pain.   °· Diagnose and treat problems that occur.   °· Give medications if they are needed. These may include calming medications (sedatives) and anesthetics.   °· Make sure you are comfortable.   °Having  monitored anesthesia care does not necessarily mean that you will be under anesthesia. It does mean that your caregivers will be able to manage anesthesia if you need it or if it occurs. It also means that you will be able to have a different type of anesthesia than you are having if you need it. When your procedure is complete, your caregivers will continue to watch your condition. They will make sure any medications wear off before you are allowed to go home.  °Document Released: 04/06/2005 Document Revised: 11/05/2012 Document Reviewed: 08/22/2012 °ExitCare® Patient Information ©2014 [employer]. ° °

## 2013-09-18 ENCOUNTER — Encounter (HOSPITAL_COMMUNITY): Payer: Self-pay | Admitting: Gastroenterology

## 2013-09-19 ENCOUNTER — Encounter: Payer: Medicare Other | Admitting: Gastroenterology

## 2013-09-23 ENCOUNTER — Other Ambulatory Visit (INDEPENDENT_AMBULATORY_CARE_PROVIDER_SITE_OTHER): Payer: Self-pay | Admitting: *Deleted

## 2013-09-23 DIAGNOSIS — K648 Other hemorrhoids: Secondary | ICD-10-CM

## 2013-09-23 DIAGNOSIS — K644 Residual hemorrhoidal skin tags: Secondary | ICD-10-CM

## 2013-09-23 HISTORY — PX: HEMORRHOID BANDING: SHX5850

## 2013-09-23 MED ORDER — OXYCODONE-ACETAMINOPHEN 5-325 MG PO TABS: 1.0000 | ORAL_TABLET | ORAL | Status: DC | PRN

## 2013-09-25 ENCOUNTER — Telehealth (INDEPENDENT_AMBULATORY_CARE_PROVIDER_SITE_OTHER): Payer: Self-pay

## 2013-09-25 NOTE — Telephone Encounter (Signed)
Pt called in stating she has been on Miralax and dulcolax and has not had A BM since Sunday. She is drinking multiple glasses of water and walking through out the day. Advised her she could switch to MOM and see if this helps with getting her bowels moving. She will call back if symptoms do not improve.

## 2013-09-25 NOTE — Telephone Encounter (Signed)
Pt called in stating she has been on miralax and dulcolax and has not had a BM since Monday. She is drinking multiple glasses of water

## 2013-10-01 ENCOUNTER — Encounter: Payer: Self-pay | Admitting: Internal Medicine

## 2013-10-01 MED ORDER — LINACLOTIDE 290 MCG PO CAPS: 290.0000 ug | ORAL_CAPSULE | Freq: Every day | ORAL | Status: DC

## 2013-10-14 ENCOUNTER — Ambulatory Visit
Admission: RE | Admit: 2013-10-14 | Discharge: 2013-10-14 | Disposition: A | Discharge status: Home / Self Care | Payer: Self-pay | Source: Ambulatory Visit

## 2013-10-14 ENCOUNTER — Ambulatory Visit
Admission: RE | Admit: 2013-10-14 | Discharge: 2013-10-14 | Disposition: A | Discharge status: Home / Self Care | Payer: Self-pay | Source: Ambulatory Visit | Attending: Obstetrics and Gynecology | Admitting: Obstetrics and Gynecology

## 2013-10-14 ENCOUNTER — Other Ambulatory Visit (INDEPENDENT_AMBULATORY_CARE_PROVIDER_SITE_OTHER): Payer: Medicare Other

## 2013-10-14 DIAGNOSIS — R740 Nonspecific elevation of levels of transaminase and lactic acid dehydrogenase [LDH]: Principal | ICD-10-CM

## 2013-10-14 DIAGNOSIS — R7402 Elevation of levels of lactic acid dehydrogenase (LDH): Secondary | ICD-10-CM

## 2013-10-14 DIAGNOSIS — Z01419 Encounter for gynecological examination (general) (routine) without abnormal findings: Secondary | ICD-10-CM

## 2013-10-14 DIAGNOSIS — R7401 Elevation of levels of liver transaminase levels: Secondary | ICD-10-CM

## 2013-10-14 DIAGNOSIS — Z1231 Encounter for screening mammogram for malignant neoplasm of breast: Secondary | ICD-10-CM

## 2013-10-14 LAB — HEPATIC FUNCTION PANEL
ALBUMIN: 4.2 g/dL (ref 3.5–5.2)
ALK PHOS: 84 U/L (ref 39–117)
ALT: 16 U/L (ref 0–35)
AST: 20 U/L (ref 0–37)
BILIRUBIN DIRECT: 0.1 mg/dL (ref 0.0–0.3)
Total Bilirubin: 0.5 mg/dL (ref 0.3–1.2)
Total Protein: 6.8 g/dL (ref 6.0–8.3)

## 2013-10-16 ENCOUNTER — Ambulatory Visit (INDEPENDENT_AMBULATORY_CARE_PROVIDER_SITE_OTHER): Payer: Medicare Other | Admitting: Gastroenterology

## 2013-10-16 ENCOUNTER — Encounter: Payer: Self-pay | Admitting: Gastroenterology

## 2013-10-16 ENCOUNTER — Ambulatory Visit (INDEPENDENT_AMBULATORY_CARE_PROVIDER_SITE_OTHER): Payer: Medicare Other | Admitting: General Surgery

## 2013-10-16 ENCOUNTER — Ambulatory Visit: Payer: Medicare Other | Admitting: Gastroenterology

## 2013-10-16 ENCOUNTER — Encounter (INDEPENDENT_AMBULATORY_CARE_PROVIDER_SITE_OTHER): Payer: Self-pay | Admitting: General Surgery

## 2013-10-16 VITALS — BP 110/78 | HR 80 | Temp 98.7°F | Resp 14 | Ht 65.0 in | Wt 139.2 lb

## 2013-10-16 VITALS — BP 110/64 | HR 76 | Ht 65.0 in | Wt 139.2 lb

## 2013-10-16 DIAGNOSIS — K219 Gastro-esophageal reflux disease without esophagitis: Secondary | ICD-10-CM

## 2013-10-16 DIAGNOSIS — K5900 Constipation, unspecified: Secondary | ICD-10-CM

## 2013-10-16 DIAGNOSIS — Z09 Encounter for follow-up examination after completed treatment for conditions other than malignant neoplasm: Secondary | ICD-10-CM

## 2013-10-16 DIAGNOSIS — K8050 Calculus of bile duct without cholangitis or cholecystitis without obstruction: Secondary | ICD-10-CM

## 2013-10-16 NOTE — Patient Instructions (Signed)
You can either take Nexium or Protonix twice daily.   Thank you for choosing me and [facility].  Pricilla Riffle. Dagoberto Ligas., MD., [facility]

## 2013-10-16 NOTE — Progress Notes (Signed)
    History of Present Illness: This is a 58 year old female returning in followup after ERCP sphincterotomy with removal of common bile duct stones. Her abdominal pain has resolved. She has had an occasional episode of reflux on Nexium 40 mg twice a day. She states she prefers her tonics 40 mg twice a day. LFTs obtained yesterday were normal.  Current Medications, Allergies, Past Medical History, Past Surgical History, Family History and Social History were reviewed in [employer] record.  Physical Exam: General: Well developed , well nourished, no acute distress Head: Normocephalic and atraumatic Eyes:  sclerae anicteric, EOMI Ears: Normal auditory acuity Mouth: No deformity or lesions Lungs: Clear throughout to auscultation Heart: Regular rate and rhythm; no murmurs, rubs or bruits Abdomen: Soft, non tender and non distended. No masses, hepatosplenomegaly or hernias noted. Normal Bowel sounds Musculoskeletal: Symmetrical with no gross deformities  Pulses:  Normal pulses noted Extremities: No clubbing, cyanosis, edema or deformities noted Neurological: Alert oriented x 4, grossly nonfocal Psychological:  Alert and cooperative. Normal mood and affect  Assessment and Recommendations:  1. Choledocholithiasis, removed at ERCP. Symptoms resolved and LFTs have normalized.  2. GERD. Continue standard antireflux measures and either Nexium 40 mg twice a day or pantoprazole 40 mg twice a day, her preference.  3. Chronic constipation. Continue Linzess, MiraLax and fiber supplements.  4. CRC screening, average risk. Colonoscopy in 11/2016. Flex sig/ACBE in 2008 showed only internal hemorrhoids.

## 2013-10-16 NOTE — Patient Instructions (Signed)
You can cut back on benefiber to about once a day Drink plenty of water Aim for 25 grams of fiber per day  Fiber Chart  You should 25-30g of fiber per day and drinking 8 glasses of water to help your bowels move regularly.  In the chart below you can look up how much fiber you are getting in an average day.  If you are not getting enough fiber, you should add a fiber supplement to your diet.  Examples of this include Metamucil, FiberCon and Citrucel.  These can be purchased at your local grocery store or pharmacy.      [email or web address]

## 2013-10-16 NOTE — Progress Notes (Signed)
Subjective:     Patient ID: Rachel Duran, female   DOB: [birth year], 58 y.o.   MRN: [id number]  HPI 58 year old Caucasian female comes in for followup after undergoing exam under anesthesia, hemorrhoidal banding x3 on March 2 at surgical center [facility]. She denies any fever, chills, nausea, vomiting, diarrhea or constipation. She is having a bowel movements at least every other day but generally every day. She is still taking Benefiber and MiraLAX. She denies any pain with bowel movements. Occasionally she will notice some swelling in her external hemorrhoids. She denies any bleeding. She states surgery was not too difficult.  Review of Systems     Objective:   Physical Exam BP 110/78  Pulse 80  Temp(Src) 98.7 F (37.1 C)  Resp 14  Ht 5\' 5"  (1.651 m)  Wt 139 lb 3.2 oz (63.141 kg)  BMI 23.16 kg/m2  LMP 07/25/1998 Alert, no apparent distress Rectal-visual inspection only today. No signs of cellulitis, induration or fluctuance. There is some nonthrombosed redundant external hemorrhoidal tissue in the left lateral area    Assessment:     Status post exam under anesthesia, hemorrhoidal banding x3     Plan:     Overall I think she is doing well. I'm glad that her bowel movements are now occurring a much more frequent basis. I advised her she could probably cut back on the Benefiber to once a day. Currently she is taking it 3 times a day. I encouraged her to get the majority of her fiber from natural food sources. She was provided a fiber chart. We discussed importance of ongoing good bowel habits. My recommendation was to leave the external hemorrhoidal tissue alone. I think as long as her bowel movements are regular and she follows good bowel habits the chance of having problems with her external hemorrhoids it should be minimal. F/u 6-8 weeks  Leighton Ruff. Redmond Pulling, MD, FACS General, Bariatric, & Minimally Invasive Surgery [facility], [place]

## 2013-10-21 ENCOUNTER — Ambulatory Visit: Payer: 59 | Admitting: Obstetrics and Gynecology

## 2013-10-21 ENCOUNTER — Telehealth: Payer: Self-pay | Admitting: Obstetrics and Gynecology

## 2013-10-21 NOTE — Telephone Encounter (Signed)
Patient canceled her AEX appointment today with Dr.Silva. Patient's father- in-law had a heart attack last night. Patient rescheduled to 11/04/13.

## 2013-10-21 NOTE — Telephone Encounter (Signed)
Thanks for the update

## 2013-10-22 ENCOUNTER — Encounter: Payer: Self-pay | Admitting: *Deleted

## 2013-10-23 ENCOUNTER — Ambulatory Visit (INDEPENDENT_AMBULATORY_CARE_PROVIDER_SITE_OTHER): Payer: Medicare Other | Admitting: *Deleted

## 2013-10-23 ENCOUNTER — Encounter: Payer: Self-pay | Admitting: Vascular Surgery

## 2013-10-23 DIAGNOSIS — I781 Nevus, non-neoplastic: Secondary | ICD-10-CM

## 2013-10-23 NOTE — Progress Notes (Signed)
X=.3% Sotradecol administered with a 27g butterfly.  Patient received a total of 5cc.  Pt here for further clean up of her spider veins. Able to treat the majority with one syringe. Tol well and anticipate good results. Will follow prn.  Photos: yes  Compression stockings applied: yes and ace at left knee

## 2013-10-31 ENCOUNTER — Telehealth: Payer: Self-pay | Admitting: Emergency Medicine

## 2013-10-31 NOTE — Telephone Encounter (Signed)
Message copied by Jaylon Boylen, Trellis Paganini on Thu Oct 31, 2013  3:11 PM ------      Message from: [place], [place]: Wed Oct 30, 2013  8:32 AM       Please contact the patient regarding her bone density report.      When I received the hard copy, the result from the left hip was different from what was sent through the electronic record.      I called the [facility] and asked them to review the results again.            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      No change in treatment is indicated at this time other than continuing on hormone therapy, daily calcium and vitamin D, and weight bearing exercise.             Thanks. ------

## 2013-10-31 NOTE — Telephone Encounter (Signed)
Patient notified of message from Dr. Quincy Simmonds. Questions answered. She will follow up with AEX with Dr. Quincy Simmonds as scheduled for Monday 11/04/13.   Routing to provider for final review. Patient agreeable to disposition. Will close encounter

## 2013-11-04 ENCOUNTER — Ambulatory Visit (INDEPENDENT_AMBULATORY_CARE_PROVIDER_SITE_OTHER): Payer: Medicare Other | Admitting: Obstetrics and Gynecology

## 2013-11-04 ENCOUNTER — Encounter: Payer: Self-pay | Admitting: Obstetrics and Gynecology

## 2013-11-04 ENCOUNTER — Other Ambulatory Visit: Payer: Self-pay | Admitting: Internal Medicine

## 2013-11-04 VITALS — BP 100/64 | HR 88 | Ht 65.0 in | Wt 139.8 lb

## 2013-11-04 DIAGNOSIS — Z01419 Encounter for gynecological examination (general) (routine) without abnormal findings: Secondary | ICD-10-CM

## 2013-11-04 MED ORDER — MORPHINE SULFATE ER BEADS 90 MG PO CP24: 90.0000 mg | ORAL_CAPSULE | Freq: Every morning | ORAL | Status: DC

## 2013-11-04 NOTE — Telephone Encounter (Signed)
approved

## 2013-11-04 NOTE — Patient Instructions (Signed)

## 2013-11-04 NOTE — Telephone Encounter (Signed)
Pt req rx on morphine (AVINZA) 90 MG 24 hr capsule

## 2013-11-04 NOTE — Telephone Encounter (Signed)
Pt requesting rx refill for avinza 90 mg, but it shows that she has an allergy to codeine phosphate.  Do you want this rx refilled for this pt?

## 2013-11-04 NOTE — Telephone Encounter (Signed)
rx refilled and printed for avinza 90 mg qty 30 0 rfs

## 2013-11-04 NOTE — Progress Notes (Signed)
Patient ID: Rachel Duran, female   DOB: 1955-10-22, 58 y.o.   MRN: [id number] GYNECOLOGY VISIT  PCP:   Benay Pillow, MD  Referring provider:   HPI: 58 y.o.   Married  Caucasian  female   G2P2002 with Patient's last menstrual period was 07/25/1998.   here for AEX. Vaginal dryness. Using Vagifem twice a week and vitamin E suppositories 4 times a week.  Vaginal cream was too messy and did not absorb well. Estring was causing interstitial cystitis flares.  Stopped HRT 2 years ago.  History of porphyria.   Recent bone density showing osteopenia of the left hip.  Taking calcium and vit D replacement for years.  Qualifies for [employer] for [employer].  Has bursitis and osteoarthritis. Had a bone graft from the left spine.  Has periodic steroid and oral steroid dosing.  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   Having vaginal tightness now.  Having bile duct stones. Had hemorrhoid banding September 23, 2013. On Linzess, Benefier, and Miralax.  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 Sees Dr. Amalia Hailey.  Some leakage with sneezing and coughing.   Hgb:   PCP Urine:  PCP  GYNECOLOGIC HISTORY: Patient's last menstrual period was 07/25/1998. Sexually active:  yes Partner preference: female Contraception:  Vasectomy/postmenopausal  Menopausal hormone therapy: Vagifem 19mcg DES exposure:   no Blood transfusions:   no Sexually transmitted diseases:  no  GYN procedures and prior surgeries:  C-section 1985, hystereoscopy with ablation 15-20 years ago. Last mammogram: 10-14-13 revealed dense breast but normal: [facility].                Last pap and high risk HPV testing:   2013 wnl:neg HR HPV History of abnormal pap smear:  no   OB History   Grav Para Term Preterm Abortions TAB SAB Ect Mult Living   2 2 2       2        LIFESTYLE: Exercise:   Walking, some yoga            Tobacco:   no Alcohol:     no Drug use:  no  OTHER HEALTH MAINTENANCE: Tetanus/TDap:   2007 Gardisil:               n/a Influenza:            04/2013 Zostavax:             n/a  Bone density:      10-14-13 revealed osteopenia left hip:[facility].  T score -1.5.  No FRAX model due to estrogen therapy.  Colonoscopy:      2008 revealed benign polyps with Dr. Fuller Plan.  Next colonoscopy due 2018.  Cholesterol check:   unsure  Family History  Problem Relation Age of Onset  . Heart disease Father   . Esophageal cancer Father   . Cancer Father   . Scleroderma Mother   . Hypertension Mother   . Cancer Paternal Grandfather     esophageal & stomach  . Diabetes Maternal Grandmother     Patient Active Problem List   Diagnosis Date Noted  . Calculus of bile duct without mention of cholecystitis or obstruction 09/17/2013  . Internal and external bleeding hemorrhoids 01/30/2013  . Postmenopausal atrophic vaginitis 10/19/2012  . Nonspecific (abnormal) findings on radiological and other examination of biliary tract 05/22/2012  . Abdominal pain, right upper quadrant 05/22/2012  . 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 Varicose veins of lower extremities  with other complications [id number]  . CERVICAL RADICULOPATHY 05/28/2010  . NECK PAIN, CHRONIC 10/01/2009  . UNSPECIFIED SYNOVITIS AND TENOSYNOVITIS 04/30/2009  . ANEMIA OF CHRONIC DISEASE 03/17/2009  . CRAMP IN LIMB 02/28/2008  . CHEST PAIN UNSPECIFIED 02/28/2008  . APHTHOUS ULCERS 01/31/2008  . INSOMNIA, CHRONIC 11/09/2007  . CONSTIPATION, DRUG INDUCED 10/11/2007  . ALLERGIC RHINITIS 04/12/2007  . DEPRESSION 03/05/2007  . DISORDERS, [place] D/T MENTAL DISORDER 03/05/2007  . [name] SYNDROME 03/05/2007  . GERD 03/05/2007  . ROSACEA 03/05/2007  . OSTEOARTHRITIS 03/05/2007  . NEURALGIA 03/05/2007  . SYMPTOM, MALAISE AND FATIGUE NEC 03/05/2007  . 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 CYSTITIS, CHRONIC INTERSTITIAL 12/06/2006  . FIBROMYALGIA, SEVERE 12/06/2006   Past Medical History  Diagnosis Date  . Disorders of porphyrin  metabolism   . Irritable bowel syndrome   . Fibromyalgia   . Arthritis   . GERD (gastroesophageal reflux disease)   . Allergy   . Chronic pain   . Cholecystitis   . Internal hemorrhoids   . Interstitial cystitis 06-06-12    hx.  . Raynauds disease     hx.  . 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 C6- C7 -hx. past fusion(bone graft used)  . Anemia   . PONV (postoperative nausea and vomiting)     now uses stomach blockers and no ponv  . Abdominal pain last 4 months    and nausea also  . Depression     bipolar depression    Past Surgical History  Procedure Laterality Date  . Cervical fusion    . Cervical lamination    . Cystoscopy    . Nasal sinus surgery    . Gyn surgery    . Hysteroscopy with ablation    . Breast enhancement surgery  2010  . Nasal sinus surgery      x5  . Ercp  05/22/2012    Procedure: ENDOSCOPIC RETROGRADE CHOLANGIOPANCREATOGRAPHY (ERCP);  Surgeon: [name], MD,FACG;  Location: [facility];  Service: Endoscopy;  Laterality: N/A;  . Cesarean section      x 1  . Hernia repair      inguinal  . Cholecystectomy  06/11/2012    Procedure: LAPAROSCOPIC CHOLECYSTECTOMY WITH INTRAOPERATIVE CHOLANGIOGRAM;  Surgeon: [name], MD,FACS;  Location: [facility];  Service: General;  Laterality: N/A;  . Bunionectomy Bilateral yrs ago  . Ercp N/A 09/17/2013    Procedure: ENDOSCOPIC RETROGRADE CHOLANGIOPANCREATOGRAPHY (ERCP);  Surgeon: [name], MD;  Location: [facility];  Service: Endoscopy;  Laterality: N/A;  . Hemorrhoid banding  09-23-13    --Dr. Greer Pickerel    ALLERGIES: Codeine phosphate; Cymbalta; Inh; Meperidine hcl; and Sulfamethoxazole  Current Outpatient Prescriptions  Medication Sig Dispense Refill  . acyclovir (ZOVIRAX) 800 MG tablet Take 400 mg by mouth daily. Takes 1/2 tablet daily      . ALPRAZolam (XANAX) 1 MG tablet Take 1 mg by mouth 3 (three) times daily as needed for anxiety.       Marland Kitchen amLODipine (NORVASC) 5 MG  tablet Take 5 mg by mouth every morning.       Marland Kitchen amphetamine-dextroamphetamine (ADDERALL XR) 30 MG 24 hr capsule Take 30 mg by mouth every morning.      . ARIPiprazole (ABILIFY) 10 MG tablet Take 5 mg by mouth at bedtime.       Marland Kitchen aspirin EC 81 MG tablet Take 81 mg by mouth every morning.      . baclofen (LIORESAL)  10 MG tablet Take 10 mg by mouth 3 (three) times daily.      . calcium gluconate 500 MG tablet Take 1 tablet by mouth 3 (three) times daily.      Marland Kitchen co-enzyme Q-10 50 MG capsule Take 50 mg by mouth daily.      . cyanocobalamin (,VITAMIN B-12,) 1000 MCG/ML injection Inject 1,000 mcg into the muscle every 21 ( twenty-one) days.      . diclofenac sodium (VOLTAREN) 1 % GEL Apply 1 g topically 2 (two) times daily as needed (pain).       Marland Kitchen esomeprazole (NEXIUM) 40 MG capsule Take 40 mg by mouth 2 (two) times daily before a meal.      . Estradiol 10 MCG TABS vaginal tablet Place 10 mcg vaginally 2 (two) times a week. Sunday and Wednesday      . Eszopiclone 3 MG TABS Take 3 mg by mouth at bedtime. Take immediately before bedtime      . hydrocortisone (ANUSOL-HC) 25 MG suppository Place 25 mg rectally 2 (two) times daily as needed for hemorrhoids or itching.      . lamoTRIgine (LAMICTAL) 150 MG tablet Take 150 mg by mouth 2 (two) times daily.       . Linaclotide (LINZESS) 290 MCG CAPS capsule Take 1 capsule (290 mcg total) by mouth daily.  30 capsule  5  . MAGNESIUM PO Take 1,000 mg by mouth 3 (three) times daily.      . Manganese 50 MG TABS Take 50 mg by mouth daily.       Marland Kitchen morphine (AVINZA) 90 MG 24 hr capsule Take 1 capsule (90 mg total) by mouth every morning.  30 capsule  0  . morphine (MSIR) 15 MG tablet Take 15 mg by mouth every 4 (four) hours as needed for severe pain.      Marland Kitchen ondansetron (ZOFRAN) 4 MG tablet Take 4 mg by mouth every 8 (eight) hours as needed for nausea or vomiting.      . pentosan polysulfate (ELMIRON) 100 MG capsule Take 200 mg by mouth 2 (two) times daily.       .  Polyethylene Glycol 3350 (MIRALAX PO) Take 17 g by mouth 4 (four) times daily.       . pregabalin (LYRICA) 150 MG capsule Take 150 mg by mouth 3 (three) times daily.       Marland Kitchen thiamine 100 MG tablet Take 100 mg by mouth daily.      . Triamcinolone Acetonide (NASACORT AQ NA) Place 2 sprays into the nose daily as needed (allergies).       . Cholecalciferol (D 400 PO) Take 400 Units by mouth 2 (two) times daily.       . [DISCONTINUED] [facility] SLOW RELEASE IRON 143 (45 FE) MG TBCR TAKE 1 TABLET EVERY DAY  30 tablet  0   Current Facility-Administered Medications  Medication Dose Route Frequency Provider Last Rate Last Dose  . ciprofloxacin (CIPRO) IVPB 400 mg  400 mg Intravenous Once [name], MD         ROS:  Pertinent items are noted in HPI.  SOCIAL HISTORY:  Married.  Retired.   PHYSICAL EXAMINATION:    BP 100/64  Pulse 88  Ht 5\' 5"  (1.651 m)  Wt 139 lb 12.8 oz (63.413 kg)  BMI 23.26 kg/m2  LMP 07/25/1998   Wt Readings from Last 3 Encounters:  11/04/13 139 lb 12.8 oz (63.413 kg)  10/16/13 139 lb 4 oz (63.163  kg)  10/16/13 139 lb 3.2 oz (63.141 kg)     Ht Readings from Last 3 Encounters:  11/04/13 5\' 5"  (1.651 m)  10/16/13 5\' 5"  (1.651 m)  10/16/13 5\' 5"  (1.651 m)    General appearance: alert, cooperative and appears stated age Head: Normocephalic, without obvious abnormality, atraumatic Neck: no adenopathy, supple, symmetrical, trachea midline and thyroid not enlarged, symmetric, no tenderness/mass/nodules Lungs: clear to auscultation bilaterally Breasts: Bilateral implants, inspection negative, No nipple retraction or dimpling, No nipple discharge or bleeding, No axillary or supraclavicular adenopathy, Normal to palpation without dominant masses Heart: regular rate and rhythm Abdomen: soft, non-tender; no masses,  no organomegaly Extremities: extremities normal, atraumatic, no cyanosis or edema Skin: Skin color, texture, turgor normal. No rashes or lesions Lymph nodes:  Cervical, supraclavicular, and axillary nodes normal. No abnormal inguinal nodes palpated Neurologic: Grossly normal  Pelvic: External genitalia:  no lesions              Urethra:  normal appearing urethra with no masses, tenderness or lesions              Bartholins and Skenes: normal                 Vagina: normal appearing vagina with normal color and discharge, no lesions              Cervix: normal appearance              Pap and high risk HPV testing done: no.            Bimanual Exam:  Uterus:  uterus is normal size, shape, consistency and nontender                                      Adnexa: normal adnexa in size, nontender and no masses                                      Rectovaginal: Confirms                                      Anus:  normal sphincter tone, no lesions  ASSESSMENT  Normal gynecologic exam. Atrophic vaginitis.  Scleroderma.  Osteopenia.  Periodic steroid use.   PLAN  Mammogram recommended yearly.  Declines Osphena.  Will increase to Vagifem 10 mcg pv at hs three times a week. Patient will call when she needs a refill.  She understands that this is an off label use of the medication. I also discussed using Canola oil in the vagina with a tampon for 2 hours to hydrate tissues as well.  Pap smear and high risk HPV testing in 2018.  Counseled on self breast exam, Calcium and vitamin D intake, weight bearing exercise. Next bone density study in 2 years.  Return annually or prn   An After Visit Summary was printed and given to the patient.  An additional 20 minutes of face to face time discussing atrophic vaginitis and osteopenia.

## 2013-11-06 ENCOUNTER — Encounter: Payer: Self-pay | Admitting: Internal Medicine

## 2013-11-07 MED ORDER — MORPHINE SULFATE ER BEADS 90 MG PO CP24: 90.0000 mg | ORAL_CAPSULE | Freq: Every morning | ORAL | Status: DC

## 2013-11-07 NOTE — Telephone Encounter (Deleted)
My apologies, i will have your other two prescriptions up at the front desk for you.  Come pick them up when you have the time

## 2013-11-27 ENCOUNTER — Encounter (INDEPENDENT_AMBULATORY_CARE_PROVIDER_SITE_OTHER): Payer: Medicare Other | Admitting: General Surgery

## 2013-12-03 ENCOUNTER — Telehealth: Payer: Self-pay | Admitting: Internal Medicine

## 2013-12-03 ENCOUNTER — Encounter: Payer: Self-pay | Admitting: Internal Medicine

## 2013-12-03 MED ORDER — ESOMEPRAZOLE MAGNESIUM 40 MG PO CPDR: 40.0000 mg | DELAYED_RELEASE_CAPSULE | Freq: Two times a day (BID) | ORAL | Status: DC

## 2013-12-03 NOTE — Telephone Encounter (Signed)
[id number] - [place], [place] - [address] is requesting re-fill on esomeprazole (NEXIUM) 40 MG capsule ° °

## 2013-12-05 ENCOUNTER — Telehealth: Payer: Self-pay | Admitting: Gastroenterology

## 2013-12-05 NOTE — Telephone Encounter (Signed)
Patient reports a 1 week history of right upper quad pain and daily nausea.  She has a history of CBD stones and ERCP in March.  She states that one day last week she did have vomiting that lasted about 6 hours but none since.  She is offered an appt for tomorrow, but she has other appts and is not able to come tomorrow.  She will come in on Monday and see Alonza Bogus, PA.  She understands if she develops fever, vomiting not controlled by zofran, persistent pain, jaundice or other symptoms she understands to go to the ER.

## 2013-12-06 ENCOUNTER — Ambulatory Visit: Payer: Medicare Other | Admitting: Internal Medicine

## 2013-12-06 ENCOUNTER — Ambulatory Visit: Payer: Medicare Other | Admitting: Physician Assistant

## 2013-12-06 ENCOUNTER — Encounter: Payer: Self-pay | Admitting: Internal Medicine

## 2013-12-07 ENCOUNTER — Encounter: Payer: Self-pay | Admitting: Internal Medicine

## 2013-12-09 ENCOUNTER — Other Ambulatory Visit (INDEPENDENT_AMBULATORY_CARE_PROVIDER_SITE_OTHER): Payer: Medicare Other

## 2013-12-09 ENCOUNTER — Encounter: Payer: Self-pay | Admitting: Gastroenterology

## 2013-12-09 ENCOUNTER — Other Ambulatory Visit: Payer: Self-pay | Admitting: *Deleted

## 2013-12-09 ENCOUNTER — Ambulatory Visit (INDEPENDENT_AMBULATORY_CARE_PROVIDER_SITE_OTHER): Payer: Medicare Other | Admitting: Gastroenterology

## 2013-12-09 VITALS — BP 112/70 | HR 80 | Ht 65.0 in | Wt 138.0 lb

## 2013-12-09 DIAGNOSIS — R1011 Right upper quadrant pain: Secondary | ICD-10-CM

## 2013-12-09 DIAGNOSIS — R110 Nausea: Secondary | ICD-10-CM

## 2013-12-09 DIAGNOSIS — R1013 Epigastric pain: Secondary | ICD-10-CM

## 2013-12-09 LAB — HEPATIC FUNCTION PANEL
ALT: 13 U/L (ref 0–35)
AST: 18 U/L (ref 0–37)
Albumin: 3.9 g/dL (ref 3.5–5.2)
Alkaline Phosphatase: 58 U/L (ref 39–117)
BILIRUBIN DIRECT: 0 mg/dL (ref 0.0–0.3)
Total Bilirubin: 0.5 mg/dL (ref 0.2–1.2)
Total Protein: 6.2 g/dL (ref 6.0–8.3)

## 2013-12-09 LAB — LIPASE: Lipase: 16 U/L (ref 11.0–59.0)

## 2013-12-09 MED ORDER — SUCRALFATE 1 G PO TABS: 1.0000 g | ORAL_TABLET | Freq: Four times a day (QID) | ORAL | Status: DC

## 2013-12-09 MED ORDER — MORPHINE-NALTREXONE 80-3.2 MG PO CPCR: 1.0000 | ORAL_CAPSULE | Freq: Every day | ORAL | Status: DC

## 2013-12-09 NOTE — Patient Instructions (Addendum)
Your physician has requested that you go to the basement for the following lab work before leaving today: LFT Lipase  Continue Nexium   We have sent the following medications to your pharmacy for you to pick up at your convenience: Carafate Tablet, please take one tablet four times daily

## 2013-12-09 NOTE — Progress Notes (Signed)
Reviewed and agree with management plan.  Malcolm T. Stark, MD FACG 

## 2013-12-09 NOTE — Progress Notes (Signed)
12/09/2013 Rachel Duran [id number] 06-21-1956   History of Present Illness:  This is a 58 year old female who is known to Dr. Fuller Plan for recent ERCP to remove retained bile duct stone. She had an ERCP in October 2013 at which time she had a sphincterotomy performed and it was questionable if she had ampullary stenosis versus a small CBD stone; also had common bile duct dilation. She then underwent laparoscopic cholecystectomy in November 2013. In February 2015 she had a bump in her alkaline phosphatase to 253. Ultrasound showed findings consistent with retained stones within the common bile duct. ERCP was performed on 09/17/2013 and sphincterotomy was extended; 2 stones and sludge were removed from the mid common bile duct.    She presents to our office today complaining of recurrent epigastric/right upper quadrant abdominal pain that has been intermittent since approximately 2 weeks ago. The pain comes on suddenly and lasts approximately up to 45 minutes. It has not been occuring every day; in between episodes she feels fine. Her last episode of pain was last night and lasted approximately 35 minutes. There is still some soreness in her upper abdomen this morning. She has nausea and had one day where she had several episodes of vomiting for 6 hours, but that is the only time she has ever vomited with this pain for the last 2 weeks (initially she was unsure if the vomiting was even related and she thought that she had food poisoning or ate something bad that didn't agree with her stomach). She does take Aleve 2 pills once a day for the past 5 weeks at the recommendation of her rheumatologist. She is on Nexium 40 mg twice daily for her acid reflux-related symptoms. She previously had been on protonix and she thinks that worked better, but her insurance would not pay for it for some time. She says it is now back on her formulary, but she just got the Nexium refilled for a 90 day supply.    Current  Medications, Allergies, Past Medical History, Past Surgical History, Family History and Social History were reviewed in [employer] record.   Physical Exam: BP 112/70  Pulse 80  Ht 5\' 5"  (1.651 m)  Wt 138 lb (62.596 kg)  BMI 22.96 kg/m2  LMP 07/25/1998 General: Well developed white female in no acute distress Head: Normocephalic and atraumatic Eyes:  Sclerae anicteric, conjunctiva pink  Ears: Normal auditory acuity Lungs: Clear throughout to auscultation Heart: Regular rate and rhythm Abdomen: Soft, non-distended.  Normal bowel sounds.  Mild epigastric and RUQ TTP without R/R/G. Musculoskeletal: Symmetrical with no gross deformities  Extremities: No edema  Neurological: Alert oriented x 4, grossly non-focal Psychological:  Alert and cooperative. Normal mood and affect  Assessment and Recommendations: -Intermittent colicky epigastric abdominal pain:  ? recurrent issues with bile duct stones versus peptic ulcer disease (she has been taking NSAID's once daily for the past 5 weeks, but is on BID PPI as well) versus esophagitis and other etiologies. -History of common bile duct stones  *Will recheck LFT's today.  May want to repeat ultrasound as well.  She will continue her Nexium 40 mg twice daily and we will place her empirically on Carafate tab 1 gram four times a day, before meals and at bedtime.  Discontinue NSAID's if possible.

## 2013-12-10 ENCOUNTER — Telehealth: Payer: Self-pay | Admitting: Internal Medicine

## 2013-12-10 NOTE — Telephone Encounter (Signed)
Pt needs one rx for 10 pill of Embeda. Pt was only able to get 20 tabs of avinza from pharm due to being dc'd. Pt states she needs name brand embeda b/c pt cannot take generic morphine. Pt did not pu the new rx due to not being name brand Pt would like a new script w/ correct name brand Embeda 30 tabs

## 2013-12-10 NOTE — Telephone Encounter (Signed)
Pt request 3 mo supply of amphetamine-dextroamphetamine (ADDERALL XR) 30 MG 24 hr capsule

## 2013-12-11 ENCOUNTER — Ambulatory Visit (HOSPITAL_COMMUNITY): Payer: Medicare Other

## 2013-12-12 ENCOUNTER — Encounter (INDEPENDENT_AMBULATORY_CARE_PROVIDER_SITE_OTHER): Payer: Self-pay | Admitting: General Surgery

## 2013-12-12 ENCOUNTER — Ambulatory Visit (INDEPENDENT_AMBULATORY_CARE_PROVIDER_SITE_OTHER): Payer: Medicare Other | Admitting: General Surgery

## 2013-12-12 VITALS — BP 118/76 | HR 72 | Temp 98.0°F | Resp 14 | Ht 65.5 in | Wt 136.4 lb

## 2013-12-12 DIAGNOSIS — Z09 Encounter for follow-up examination after completed treatment for conditions other than malignant neoplasm: Secondary | ICD-10-CM

## 2013-12-12 DIAGNOSIS — K644 Residual hemorrhoidal skin tags: Secondary | ICD-10-CM

## 2013-12-12 NOTE — Telephone Encounter (Signed)
Pt is aware md will be in office tomorrow

## 2013-12-12 NOTE — Patient Instructions (Signed)
Call about 6 weeks before your desired surgical date to schedule surgery for excisional hemorrhoidectomy x 2 Continue to use analpram-HC When flare - do sitz bathes, can try applying moistened tea bags to swollen hemorrhoids

## 2013-12-13 ENCOUNTER — Ambulatory Visit (HOSPITAL_COMMUNITY)
Admission: RE | Admit: 2013-12-13 | Discharge: 2013-12-13 | Disposition: A | Discharge status: Home / Self Care | Payer: Medicare Other | Source: Ambulatory Visit | Attending: Gastroenterology | Admitting: Gastroenterology

## 2013-12-13 DIAGNOSIS — K8050 Calculus of bile duct without cholangitis or cholecystitis without obstruction: Secondary | ICD-10-CM | POA: Insufficient documentation

## 2013-12-13 DIAGNOSIS — R1011 Right upper quadrant pain: Secondary | ICD-10-CM

## 2013-12-13 NOTE — Progress Notes (Signed)
Subjective:     Patient ID: Rachel Duran, female   DOB: 1956/06/28, 57 y.o.   MRN: [id number]  HPI 58 year old female comes in for followup after undergoing exam under anesthesia, hemorrhoidal banding x3 on March 2. I last saw her about 6 weeks ago. She states that now she is having ongoing problems with her external hemorrhoids. They will flare up every 7-10 days. When they flare she will have significant enlargement of her external hemorrhoids, they will be extremely painful, and had bleeding. She will use Analpram which will help. She is continued to take Benefiber twice a day, MiraLAX once or twice a day. She is drinking plenty of water. She states that she is just tired of dealing with her external hemorrhoids. She'll have bowel movements at least every other day.  Review of Systems     Objective:   Physical Exam  Vitals reviewed. Constitutional: She appears well-developed and well-nourished. No distress.  HENT:  Head: Normocephalic and atraumatic.  Cardiovascular: Normal rate.   Pulmonary/Chest: Effort normal. No respiratory distress.  Abdominal: Soft. She exhibits no distension. There is no tenderness.  Genitourinary: Rectal exam shows external hemorrhoid. Rectal exam shows no internal hemorrhoid, no fissure and anal tone normal.  Currently there are no thrombosed external hemorrhoids. However she does have an enlarged external hemorrhoidal tissue on the left. She also has some mild redundant external hemorrhoidal tissue on the right. Good tone. Anoscopy reveals well-healed banding of internal hemorrhoids. There are no significant internal hemorrhoids  Skin: She is not diaphoretic.       Assessment:     Status post exam under anesthesia, banding of internal hemorrhoids x3 Symptomatic external hemorrhoids with bleeding     Plan:     At this point I really don't have anything else to offer her for her external hemorrhoids other than surgical excision. We discussed surgery which  would in detail Gen. Anesthesia. We discussed the risk and benefits of surgery including but not limited to bleeding, infection, scarring, hemorrhoidal recurrence, postoperative pain, urinary retention, blood clot formation, injury to sphincters, in the typical recovery course. I discussed the procedure in detail. Right now she is going to continue medical management since it is the beginning of the summer. She would like to have surgery toward the end of the summer. She was instructed to contact the office about 6 weeks before she would like to have surgery in order to get on the surgery schedule  In the interim the patient was encouraged to continue her current supplemental fiber, and laxative as needed as well as Laurin Coder. Redmond Pulling, MD, FACS General, Bariatric, & Minimally Invasive Surgery [facility], [place]

## 2013-12-13 NOTE — Progress Notes (Signed)
Actigall will really not help dissolve a stone but it may help prevent future stone. Begin Actigall 300 mg bid. She can further address this treatment with Dr. Delrae Alfred.

## 2013-12-17 MED ORDER — AMPHETAMINE-DEXTROAMPHET ER 30 MG PO CP24: 30.0000 mg | ORAL_CAPSULE | ORAL | Status: DC

## 2013-12-17 NOTE — Telephone Encounter (Signed)
ok 

## 2013-12-17 NOTE — Telephone Encounter (Signed)
rx up front for p/u, pt aware

## 2013-12-17 NOTE — Telephone Encounter (Signed)
Pt aware Dr [name] is out of the office.  She has enough to get her through till Friday.  Called Dr [name] on cell and he is going to call me tomorrow since he is not coming in.  Anything he prescribes to her she would like name brand

## 2013-12-17 NOTE — Telephone Encounter (Signed)
Pt has only 10 pills left

## 2013-12-17 NOTE — Telephone Encounter (Signed)
Will you sign for Dr [name] since he is out of the office?

## 2013-12-18 ENCOUNTER — Other Ambulatory Visit: Payer: Self-pay

## 2013-12-18 MED ORDER — URSODIOL 300 MG PO CAPS: 300.0000 mg | ORAL_CAPSULE | Freq: Two times a day (BID) | ORAL | Status: DC

## 2013-12-18 NOTE — Telephone Encounter (Signed)
Per Dr [name] the trial was Embeda.  Found medicine on med list, he okayed for brand name.  Can you sign for it?

## 2013-12-18 NOTE — Telephone Encounter (Signed)
Per Dr [name] ok to change to [facility].  Pt requesting XR and name brand.  Sent Dr [name] a message to find out dosage directions of medicine.  Waiting for response.  Pt aware

## 2013-12-18 NOTE — Telephone Encounter (Signed)
pls call pt after speaking w/ dr [name] or not. thanks

## 2013-12-19 ENCOUNTER — Other Ambulatory Visit: Payer: Self-pay | Admitting: *Deleted

## 2013-12-19 MED ORDER — EMBEDA 80-3.2 MG PO CPCR: 1.0000 | ORAL_CAPSULE | Freq: Every day | ORAL | Status: DC

## 2013-12-19 MED ORDER — MORPHINE-NALTREXONE 80-3.2 MG PO CPCR: 1.0000 | ORAL_CAPSULE | Freq: Every day | ORAL | Status: DC

## 2013-12-19 NOTE — Addendum Note (Signed)
Addended by: Townsend Roger D on: 12/19/2013 11:33 AM   Modules accepted: Orders

## 2013-12-19 NOTE — Telephone Encounter (Signed)
rx up front for pt to p/u, pt aware

## 2013-12-19 NOTE — Telephone Encounter (Signed)
ok 

## 2013-12-19 NOTE — Telephone Encounter (Signed)
Pt will be here this afternoon to pu rx. Advised pt dr Raliegh Ip will sign her rx.  If you need to speak w/ pt call on cell phone.

## 2013-12-20 ENCOUNTER — Other Ambulatory Visit: Payer: Self-pay | Admitting: Internal Medicine

## 2014-01-01 ENCOUNTER — Encounter: Payer: Self-pay | Admitting: Internal Medicine

## 2014-01-01 ENCOUNTER — Encounter: Payer: Self-pay | Admitting: *Deleted

## 2014-01-01 ENCOUNTER — Ambulatory Visit (INDEPENDENT_AMBULATORY_CARE_PROVIDER_SITE_OTHER): Payer: Medicare Other | Admitting: Internal Medicine

## 2014-01-01 VITALS — BP 136/80 | HR 90 | Temp 98.3°F | Ht 65.0 in | Wt 137.2 lb

## 2014-01-01 DIAGNOSIS — I7300 Raynaud's syndrome without gangrene: Secondary | ICD-10-CM

## 2014-01-01 DIAGNOSIS — K8050 Calculus of bile duct without cholangitis or cholecystitis without obstruction: Secondary | ICD-10-CM

## 2014-01-01 DIAGNOSIS — K5909 Other constipation: Secondary | ICD-10-CM

## 2014-01-01 DIAGNOSIS — R300 Dysuria: Secondary | ICD-10-CM

## 2014-01-01 DIAGNOSIS — IMO0001 Reserved for inherently not codable concepts without codable children: Secondary | ICD-10-CM

## 2014-01-01 DIAGNOSIS — IMO0002 Reserved for concepts with insufficient information to code with codable children: Secondary | ICD-10-CM

## 2014-01-01 LAB — HEPATIC FUNCTION PANEL
ALT: 16 U/L (ref 0–35)
AST: 20 U/L (ref 0–37)
Albumin: 3.9 g/dL (ref 3.5–5.2)
Alkaline Phosphatase: 65 U/L (ref 39–117)
BILIRUBIN DIRECT: 0.1 mg/dL (ref 0.0–0.3)
TOTAL PROTEIN: 6.3 g/dL (ref 6.0–8.3)
Total Bilirubin: 0.5 mg/dL (ref 0.2–1.2)

## 2014-01-01 MED ORDER — EMBEDA 80-3.2 MG PO CPCR: 1.0000 | ORAL_CAPSULE | Freq: Every day | ORAL | Status: DC

## 2014-01-01 NOTE — Progress Notes (Signed)
Subjective:    Patient ID: Rachel Duran, female    DOB: [birth year], 58 y.o.   MRN: [id number]  HPI  The patient has been on avinza and has change to embeda due to formulary changes mood Has Fibromyalgia and chronic pain  Needs an Rx for 10 of embeda and her 3 months RX Mood  ADD   Review of Systems  Constitutional: Positive for fatigue. Negative for activity change and appetite change.  HENT: Negative for congestion, ear pain, postnasal drip and sinus pressure.   Eyes: Negative for redness and visual disturbance.  Respiratory: Negative for cough, shortness of breath and wheezing.   Gastrointestinal: Negative for abdominal pain and abdominal distention.  Genitourinary: Negative for dysuria, frequency and menstrual problem.  Musculoskeletal: Positive for arthralgias, back pain, myalgias, neck pain and neck stiffness. Negative for joint swelling.  Skin: Negative for rash and wound.  Neurological: Positive for weakness. Negative for dizziness and headaches.  Hematological: Negative for adenopathy. Does not bruise/bleed easily.  Psychiatric/Behavioral: Positive for dysphoric mood. Negative for sleep disturbance and decreased concentration. The patient is nervous/anxious.        Objective:   Physical Exam  Nursing note and vitals reviewed. Constitutional: She is oriented to person, place, and time. She appears well-developed and well-nourished. No distress.  HENT:  Head: Normocephalic and atraumatic.  Eyes: Conjunctivae and EOM are normal. Pupils are equal, round, and reactive to light.  Neck: Normal range of motion. Neck supple. No JVD present. No tracheal deviation present. No thyromegaly present.  Cardiovascular: Normal rate and regular rhythm.   Murmur heard. Pulmonary/Chest: Effort normal and breath sounds normal. She has no wheezes. She exhibits no tenderness.  Abdominal: Soft. Bowel sounds are normal.  Musculoskeletal: Normal range of motion. She exhibits no edema and  no tenderness.  Lymphadenopathy:    She has no cervical adenopathy.  Neurological: She is alert and oriented to person, place, and time. She has normal reflexes. No cranial nerve deficit.  Skin: Skin is warm and dry. She is not diaphoretic.  Psychiatric: She has a normal mood and affect. Her behavior is normal.   Past Medical History  Diagnosis Date  . Disorders of porphyrin metabolism   . Irritable bowel syndrome   . Fibromyalgia   . Arthritis   . GERD (gastroesophageal reflux disease)   . Allergy   . Chronic pain   . Cholecystitis   . Internal hemorrhoids   . Interstitial cystitis 06-06-12    hx.  . Raynauds disease     hx.  . 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 C6- C7 -hx. past fusion(bone graft used)  . Anemia   . PONV (postoperative nausea and vomiting)     now uses stomach blockers and no ponv  . Abdominal pain last 4 months    and nausea also  . Depression     bipolar depression    History   Social History  . Marital Status: Married    Spouse Name: N/A    Number of Children: N/A  . Years of Education: N/A   Occupational History  . retired    Social History Main Topics  . Smoking status: Never Smoker   . Smokeless tobacco: Never Used  . Alcohol Use: No  . Drug Use: No  . Sexual Activity: Yes    Partners: Male    Birth Control/ Protection: Post-menopausal     Comment: vasectomy   Other Topics Concern  .  Not on file   Social History Narrative  . No narrative on file    Past Surgical History  Procedure Laterality Date  . Cervical fusion    . Cervical lamination    . Cystoscopy    . Nasal sinus surgery    . Gyn surgery    . Hysteroscopy with ablation    . Breast enhancement surgery  2010  . Nasal sinus surgery      x5  . Ercp  05/22/2012    Procedure: ENDOSCOPIC RETROGRADE CHOLANGIOPANCREATOGRAPHY (ERCP);  Surgeon: [name], MD,FACG;  Location: [facility];  Service: Endoscopy;  Laterality: N/A;  . Cesarean  section      x 1  . Hernia repair      inguinal  . Cholecystectomy  06/11/2012    Procedure: LAPAROSCOPIC CHOLECYSTECTOMY WITH INTRAOPERATIVE CHOLANGIOGRAM;  Surgeon: [name], MD,FACS;  Location: [facility];  Service: General;  Laterality: N/A;  . Bunionectomy Bilateral yrs ago  . Ercp N/A 09/17/2013    Procedure: ENDOSCOPIC RETROGRADE CHOLANGIOPANCREATOGRAPHY (ERCP);  Surgeon: [name], MD;  Location: [facility];  Service: Endoscopy;  Laterality: N/A;  . Hemorrhoid banding  09-23-13    --Dr. Greer Pickerel    Family History  Problem Relation Age of Onset  . Heart disease Father   . Esophageal cancer Father   . Cancer Father   . Scleroderma Mother   . Hypertension Mother   . Cancer Paternal Grandfather     esophageal & stomach  . Diabetes Maternal Grandmother     Allergies  Allergen Reactions  . Codeine Phosphate Nausea And Vomiting    Severe n&v  . Cymbalta [Duloxetine Hcl]     Urinary retention  . Inh [Isoniazid]     Hepatitis  . Meperidine Hcl Rash    REACTION: red skin  . Sulfamethoxazole Rash    Current Outpatient Prescriptions on File Prior to Visit  Medication Sig Dispense Refill  . acyclovir (ZOVIRAX) 800 MG tablet Take 400 mg by mouth daily. Takes 1/2 tablet daily      . ALPRAZolam (XANAX) 1 MG tablet Take 1 mg by mouth 3 (three) times daily as needed for anxiety.       Marland Kitchen amLODipine (NORVASC) 5 MG tablet Take 5 mg by mouth every morning.       Marland Kitchen amphetamine-dextroamphetamine (ADDERALL XR) 30 MG 24 hr capsule Take 1 capsule (30 mg total) by mouth every morning.  90 capsule  0  . ARIPiprazole (ABILIFY) 10 MG tablet Take 5 mg by mouth at bedtime.       Marland Kitchen aspirin EC 81 MG tablet Take 81 mg by mouth every morning.      . baclofen (LIORESAL) 10 MG tablet Take 10 mg by mouth 3 (three) times daily.      . calcium gluconate 500 MG tablet Take 1 tablet by mouth 3 (three) times daily.      . Cholecalciferol (D 400 PO) Take 400 Units by mouth 2 (two) times daily.        Marland Kitchen co-enzyme Q-10 50 MG capsule Take 50 mg by mouth daily.      . cyanocobalamin (,VITAMIN B-12,) 1000 MCG/ML injection Inject 1,000 mcg into the muscle every 21 ( twenty-one) days.      . diclofenac sodium (VOLTAREN) 1 % GEL Apply 1 g topically 2 (two) times daily as needed (pain).       . EMBEDA 80-3.2 MG CPCR Take 1 capsule by mouth daily.  30 capsule  0  . esomeprazole (NEXIUM) 40 MG capsule Take 1 capsule (40 mg total) by mouth 2 (two) times daily before a meal.  180 capsule  0  . Estradiol 10 MCG TABS vaginal tablet Place 10 mcg vaginally 2 (two) times a week. Sunday and Wednesday      . Eszopiclone 3 MG TABS Take 3 mg by mouth at bedtime. Take immediately before bedtime      . hydrocortisone (ANUSOL-HC) 25 MG suppository Place 25 mg rectally 2 (two) times daily as needed for hemorrhoids or itching.      . lamoTRIgine (LAMICTAL) 150 MG tablet Take 150 mg by mouth 2 (two) times daily.       . Linaclotide (LINZESS) 290 MCG CAPS capsule Take 1 capsule (290 mcg total) by mouth daily.  30 capsule  5  . MAGNESIUM PO Take 1,000 mg by mouth 3 (three) times daily.      . Manganese 50 MG TABS Take 50 mg by mouth daily.       Marland Kitchen morphine (MSIR) 15 MG tablet Take 15 mg by mouth every 4 (four) hours as needed for severe pain.      Marland Kitchen ondansetron (ZOFRAN) 4 MG tablet Take 4 mg by mouth every 8 (eight) hours as needed for nausea or vomiting.      . pentosan polysulfate (ELMIRON) 100 MG capsule Take 200 mg by mouth 2 (two) times daily.       . Polyethylene Glycol 3350 (MIRALAX PO) Take 17 g by mouth 4 (four) times daily.       . pramoxine-hydrocortisone (PROCTOCREAM-HC) 1-1 % rectal cream Place 1 application rectally 2 (two) times daily.      . pregabalin (LYRICA) 150 MG capsule Take 150 mg by mouth 3 (three) times daily.       Marland Kitchen thiamine 100 MG tablet Take 100 mg by mouth daily.      . Triamcinolone Acetonide (NASACORT AQ NA) Place 2 sprays into the nose daily as needed (allergies).       . ursodiol  (ACTIGALL) 300 MG capsule Take 1 capsule (300 mg total) by mouth 2 (two) times daily.  60 capsule  3  . [DISCONTINUED] [facility] SLOW RELEASE IRON 143 (45 FE) MG TBCR TAKE 1 TABLET EVERY DAY  30 tablet  0   Current Facility-Administered Medications on File Prior to Visit  Medication Dose Route Frequency Provider Last Rate Last Dose  . ciprofloxacin (CIPRO) IVPB 400 mg  400 mg Intravenous Once [name], MD        BP 136/80  Pulse 90  Temp(Src) 98.3 F (36.8 C) (Oral)  Ht 5\' 5"  (1.651 m)  Wt 137 lb 3.2 oz (62.234 kg)  BMI 22.83 kg/m2  SpO2 98%  LMP 07/25/1998          Assessment & Plan:  Chronic pain Follow up at [facility] She has change to Dr Encarnacion Slates  Stable pain. contipation and depression.  Complicated by hx of autoimmune rash and raynauds  The b12 injection has been problematic due to only getting one vial at a time and takes it every three weeks.  On actigal for chronic biliary duct stones s/p cholecystectomy

## 2014-01-01 NOTE — Patient Instructions (Signed)
The patient is instructed to continue all medications as prescribed. Schedule followup with check out clerk upon leaving the clinic  

## 2014-01-01 NOTE — Progress Notes (Signed)
Pre visit review using our clinic review tool, if applicable. No additional management support is needed unless otherwise documented below in the visit note. 

## 2014-01-01 NOTE — Addendum Note (Signed)
Addended by: Ricard Dillon on: 01/01/2014 02:41 PM   Modules accepted: Orders

## 2014-01-03 ENCOUNTER — Telehealth: Payer: Self-pay | Admitting: Internal Medicine

## 2014-01-03 MED ORDER — CIPROFLOXACIN HCL 250 MG PO TABS: 250.0000 mg | ORAL_TABLET | Freq: Two times a day (BID) | ORAL | Status: DC

## 2014-01-03 NOTE — Telephone Encounter (Signed)
Pt is still in pain requesting results of ua

## 2014-01-03 NOTE — Telephone Encounter (Signed)
Sent in antibiotic 

## 2014-01-03 NOTE — Telephone Encounter (Signed)
Urine culture is done?  Okay to send a Rx?  [facility] in [place]

## 2014-01-03 NOTE — Telephone Encounter (Signed)
Urine culture per Dr [name]

## 2014-01-04 LAB — URINE CULTURE

## 2014-01-07 ENCOUNTER — Telehealth: Payer: Self-pay | Admitting: Internal Medicine

## 2014-01-07 NOTE — Telephone Encounter (Signed)
Pt is needing new rx morphine (MSIR) 15 MG tablet, please call when available for pick up.

## 2014-01-09 ENCOUNTER — Encounter: Payer: Self-pay | Admitting: Internal Medicine

## 2014-01-09 NOTE — Telephone Encounter (Signed)
Pt was given EMBEDA 80-3.2 MG CPCR on 6.10.15.  Pls advise.

## 2014-01-10 MED ORDER — MORPHINE SULFATE 15 MG PO TABS: 15.0000 mg | ORAL_TABLET | ORAL | Status: DC | PRN

## 2014-01-10 NOTE — Telephone Encounter (Signed)
That is her "break through and it is ok"

## 2014-01-10 NOTE — Telephone Encounter (Signed)
Rx printed. Called and spoke with pt and pt is aware.

## 2014-01-17 ENCOUNTER — Other Ambulatory Visit: Payer: Self-pay | Admitting: Obstetrics and Gynecology

## 2014-01-17 MED ORDER — ESTRADIOL 10 MCG VA TABS: 10.0000 ug | ORAL_TABLET | VAGINAL | Status: DC

## 2014-01-17 NOTE — Telephone Encounter (Signed)
Last AEX 11/04/12- Vagifem rx Last refilled 10/19/12 #24/3 refills AEX 11/06/14  Patient needing refill okay to refill?

## 2014-01-17 NOTE — Telephone Encounter (Signed)
Patient is calling to let dr Quincy Simmonds know she has finished the vagifim she had and is ready for the new rx to be [facility] with the new directions.

## 2014-01-20 NOTE — Telephone Encounter (Signed)
01/17/14 Vagifem #8/10 refills was sent to pharmacy patient is aware.

## 2014-01-20 NOTE — Telephone Encounter (Signed)
Patent notified and aware that rx has been sent to pharmacy.

## 2014-01-27 MED ORDER — ESTRADIOL 10 MCG VA TABS: 10.0000 ug | ORAL_TABLET | VAGINAL | Status: DC

## 2014-01-27 NOTE — Telephone Encounter (Addendum)
Incoming fax from [facility] for Vagifem 10 mcg tablet  RX was sent to [facility] by mistake.  Vagifem #8/10 refills sent to [facility]

## 2014-01-27 NOTE — Addendum Note (Signed)
Addended by: Alfonzo Feller on: 01/27/2014 11:34 AM   Modules accepted: Orders

## 2014-02-03 ENCOUNTER — Telehealth: Payer: Self-pay | Admitting: Obstetrics and Gynecology

## 2014-02-03 MED ORDER — ESTRADIOL 10 MCG VA TABS
ORAL_TABLET | VAGINAL | Status: DC
Start: 2014-02-03 — End: 2014-04-08

## 2014-02-03 NOTE — Telephone Encounter (Signed)
Patient would like to talk to a nurse regarding her prescription that was sent to [facility]. Patient says there is a problem with the prescription and our office needs to call [facility]. Patient will give details to the nurse.

## 2014-02-03 NOTE — Telephone Encounter (Signed)
Patient calling and states that her prior rx with [facility] was for Vagifem 10 mcg 1 tablet pv 2 times per week and now her rx will change to 3 times per week. Discussed this at office visit with Dr. Quincy Simmonds on 11/04/13 but did not need refills at this time. Requested new order be placed with new directions and that it was to modify prior rx.  New rx sent with new instructions, okay per note from office visit with Dr. Quincy Simmonds.   Routing to provider for final review. Patient agreeable to disposition. Will close encounter

## 2014-02-04 LAB — PROTIME-INR: INR: 0.9 (ref 0.9–1.1)

## 2014-02-04 LAB — COMPREHENSIVE METABOLIC PANEL
ALT: 13
AST: 18 U/L
Alkaline Phosphatase: 84 U/L
CREATININE: 0.56
GLUCOSE: 57
POTASSIUM: 4.3 mmol/L
Total Bilirubin: 0.4 mg/dL

## 2014-02-04 LAB — CBC
HGB: 12.3 g/dL
PLATELET COUNT: 334
WBC: 8.9

## 2014-02-04 LAB — LIPASE: Lipase: 15 units/L (ref 0–53)

## 2014-02-04 LAB — FIBRINOGEN: Fibrinogen: 586

## 2014-02-10 ENCOUNTER — Other Ambulatory Visit: Payer: Self-pay | Admitting: Internal Medicine

## 2014-02-23 IMAGING — CR DG CHEST 2V
2 series · 2 of 2 positions shown · non-contrast
Comparison: 02/07/2012 study.

CLINICAL DATA: 3-week history of coughing.  History of positive TB
test.  History  INH therapy

CHEST - 2 VIEW

[view not recorded (1 of 2)]
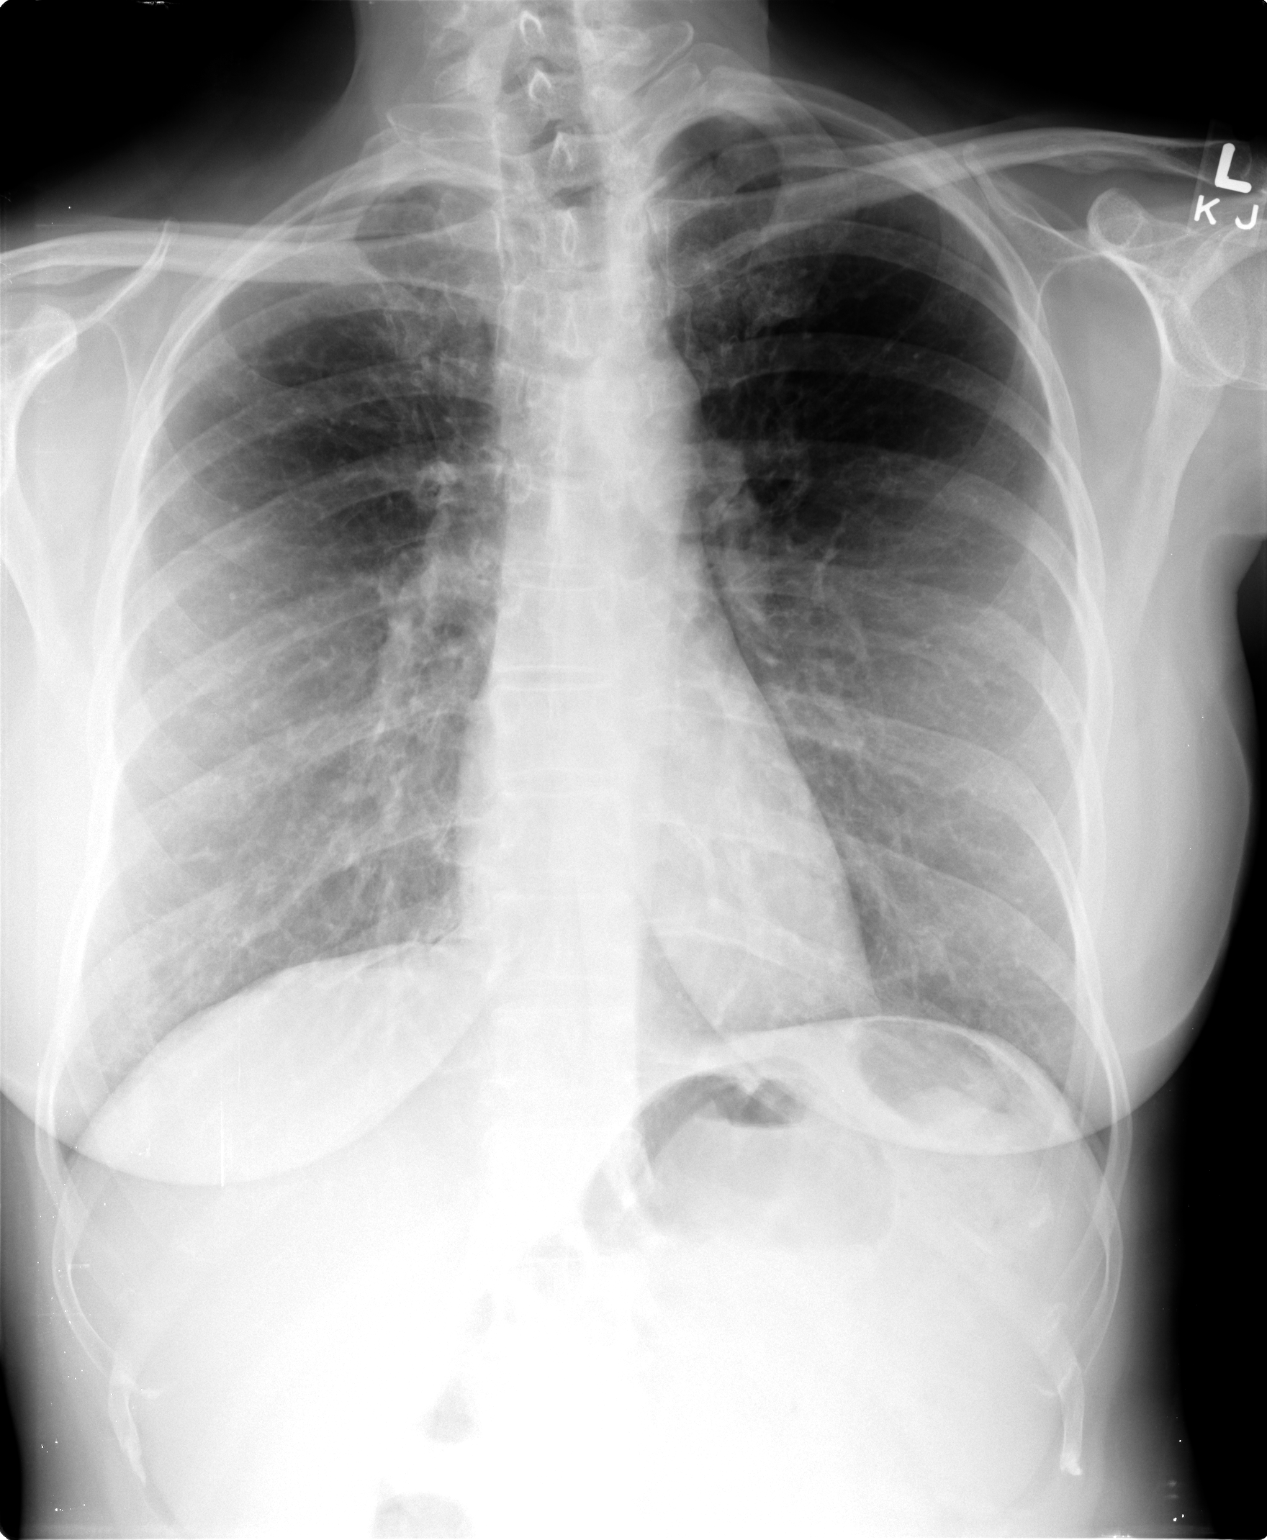

[view not recorded (2 of 2)]
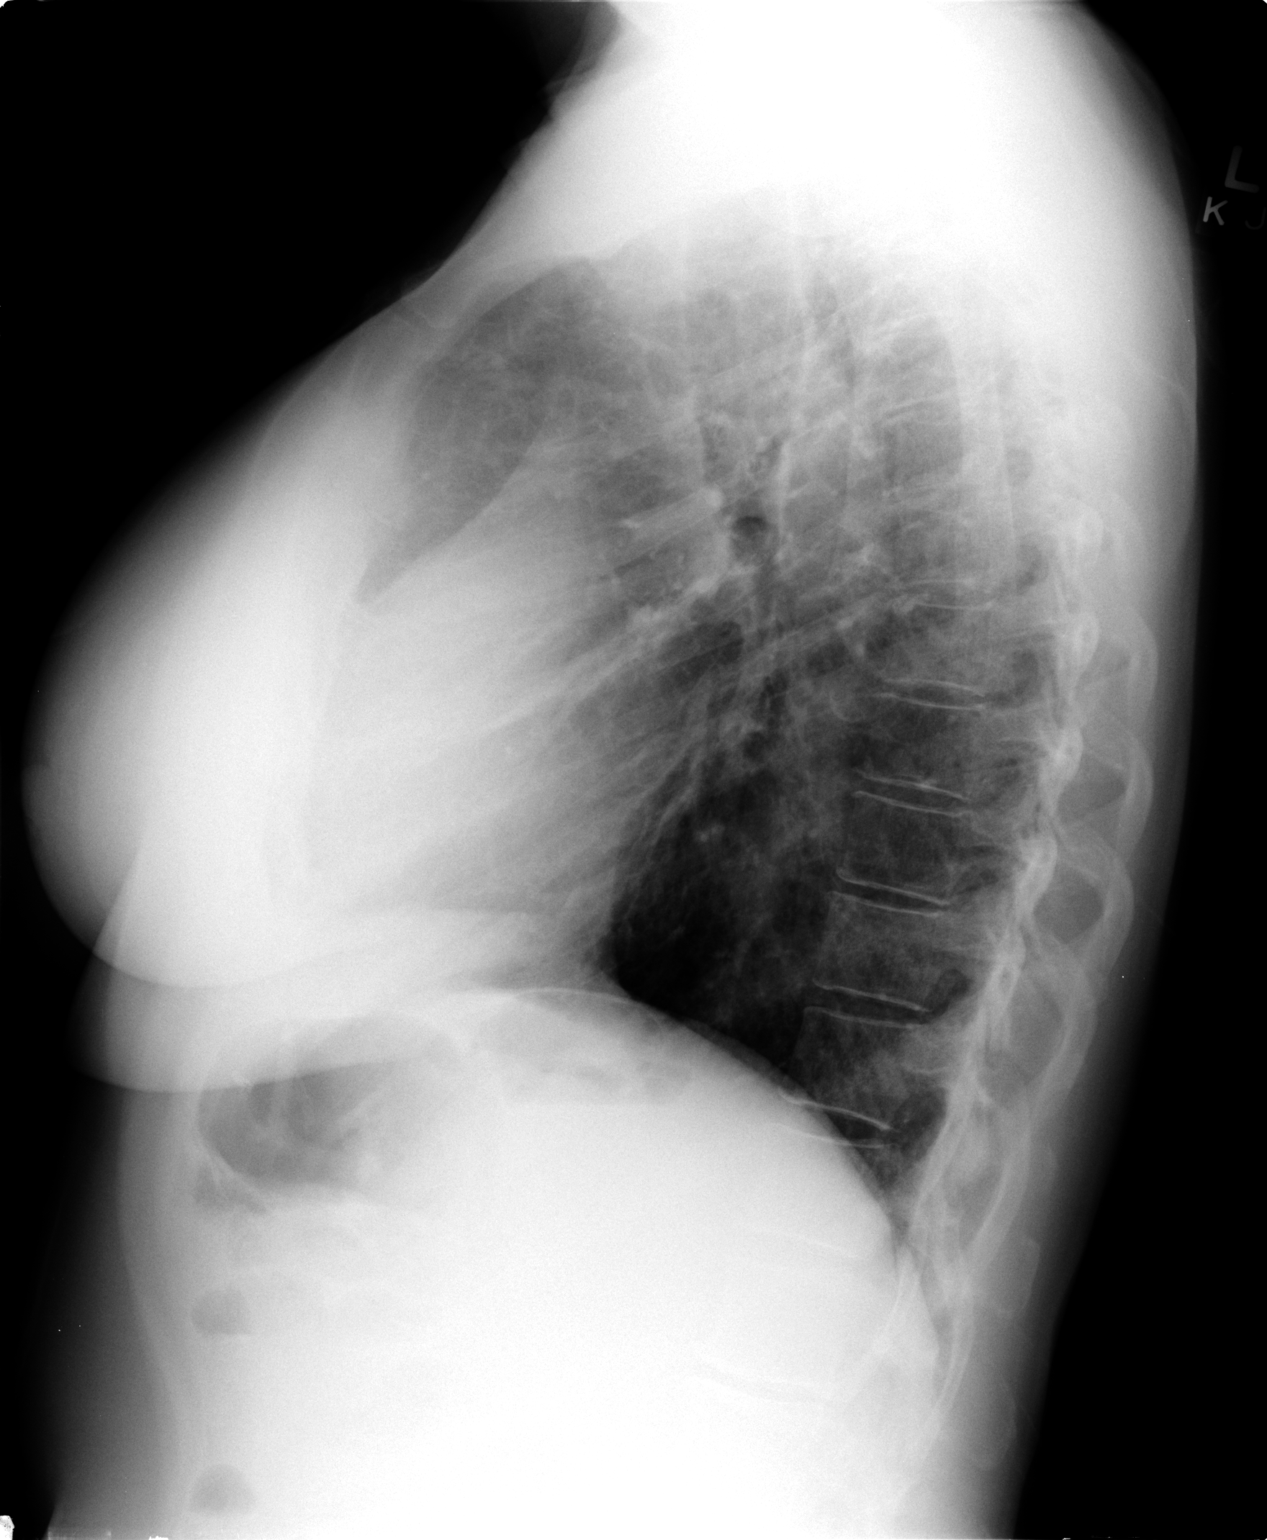

[2 of 2 positions shown; findings below may reference images not displayed]

FINDINGS: Cardiac silhouette is normal size and shape.  Mediastinal
and hilar contours appear normal.  No pulmonary infiltrates,
cavities, or nodules were evident. No pleural abnormality is
evident.  There is minimal central peribronchial thickening.
IMPRESSION: No evidence of tuberculosis.  Minimal central peribronchial
thickening.  This may be associated with bronchitis, asthma, and
reactive airway disease.

## 2014-02-25 ENCOUNTER — Telehealth: Payer: Self-pay | Admitting: Obstetrics and Gynecology

## 2014-02-25 ENCOUNTER — Telehealth: Payer: Self-pay | Admitting: Internal Medicine

## 2014-02-25 NOTE — Telephone Encounter (Signed)
Patient said her insurance says they need prior authroization for vagifem. Said we need to call [facility] at [phone] said we need to call her as well

## 2014-02-25 NOTE — Telephone Encounter (Signed)
Spoke with Annie Main at [facility]. PA being faxed over at this time to be filled out and sent back.

## 2014-02-25 NOTE — Telephone Encounter (Signed)
[id number] [place], [place] is requesting re-fill on esomeprazole (NEXIUM) 40 MG capsule

## 2014-02-26 ENCOUNTER — Other Ambulatory Visit: Payer: Self-pay

## 2014-02-26 MED ORDER — ESOMEPRAZOLE MAGNESIUM 40 MG PO CPDR: 40.0000 mg | DELAYED_RELEASE_CAPSULE | Freq: Two times a day (BID) | ORAL | Status: DC

## 2014-02-26 NOTE — Telephone Encounter (Signed)
Made in error. Please disregard.

## 2014-02-26 NOTE — Telephone Encounter (Signed)
rx sent in electronically 

## 2014-02-27 NOTE — Telephone Encounter (Signed)
Form signed by me. 

## 2014-02-27 NOTE — Telephone Encounter (Signed)
Spoke with patient. Advised prior authorization has been faxed to [facility] takes 48-72 hours to get response. Patient states that [facility] states we will need to contact them to get an authorization number to provide to patient so she can order her medication. Advised will check on status in 48-72 hours and will give patient a call to let her know the status. Patient agreeable.

## 2014-02-27 NOTE — Telephone Encounter (Signed)
Prior authorization to Dr.Silva's desk for review and signature before faxing.

## 2014-03-03 ENCOUNTER — Telehealth: Payer: Self-pay | Admitting: Internal Medicine

## 2014-03-03 MED ORDER — ESOMEPRAZOLE MAGNESIUM 40 MG PO CPDR: 40.0000 mg | DELAYED_RELEASE_CAPSULE | Freq: Two times a day (BID) | ORAL | Status: DC

## 2014-03-03 MED ORDER — AMPHETAMINE-DEXTROAMPHET ER 30 MG PO CP24: 30.0000 mg | ORAL_CAPSULE | ORAL | Status: DC

## 2014-03-03 MED ORDER — ESZOPICLONE 3 MG PO TABS: 3.0000 mg | ORAL_TABLET | Freq: Every day | ORAL | Status: DC

## 2014-03-03 NOTE — Telephone Encounter (Signed)
rx sent in electronically 

## 2014-03-03 NOTE — Telephone Encounter (Signed)
Pt needs re-fill on amphetamine-dextroamphetamine (ADDERALL XR) 30 MG 24 hr capsule Pt states you can leave detailed message on her machine

## 2014-03-03 NOTE — Telephone Encounter (Signed)
Spoke with [facility]. Who states that prior authorization is not required for this med but that patient was trying to pick up rx too soon. Patient is able to pick up rx on 03/15/14. Advised there will be no problem for quantity. Spoke with patient. Advised of message from [facility]. Patient is agreeable and verbalizes understanding. Patient will call to order refills on 8/22 and will call back if she needs any further help.  Routing to provider for final review. Patient agreeable to disposition. Will close encounter

## 2014-03-03 NOTE — Telephone Encounter (Signed)
Ok per Dr [name], told pt that Dr [name] would be in sometime on 03/04/14 to sign the rx and to pick it up on Wednesday since unsure of time he will be here.  Pt stated that husband will pick it up on Thursday

## 2014-03-03 NOTE — Telephone Encounter (Signed)
[id number] [place], [place] is requesting re-fill on esomeprazole (NEXIUM) 40 MG capsule

## 2014-03-22 ENCOUNTER — Other Ambulatory Visit: Payer: Self-pay | Admitting: Internal Medicine

## 2014-03-24 ENCOUNTER — Telehealth (INDEPENDENT_AMBULATORY_CARE_PROVIDER_SITE_OTHER): Payer: Self-pay | Admitting: General Surgery

## 2014-03-24 NOTE — Telephone Encounter (Signed)
Patient has called and is ready for her hemorrhoid surgery.  She stated you told her just to call back when she was ready and you would write the orders.  I have let her know once orders are written we will call her back to get her scheduled.

## 2014-03-24 NOTE — Telephone Encounter (Signed)
i filled out orders and sheet on 8/27 and gave to [place] who gave to [place].

## 2014-04-08 ENCOUNTER — Encounter: Payer: Self-pay | Admitting: Radiology

## 2014-04-08 ENCOUNTER — Encounter: Payer: Self-pay | Admitting: Family Medicine

## 2014-04-08 ENCOUNTER — Ambulatory Visit (INDEPENDENT_AMBULATORY_CARE_PROVIDER_SITE_OTHER): Payer: Medicare Other | Admitting: Family Medicine

## 2014-04-08 VITALS — BP 128/68 | HR 88 | Temp 98.0°F | Ht 65.5 in | Wt 140.0 lb

## 2014-04-08 DIAGNOSIS — K5909 Other constipation: Secondary | ICD-10-CM

## 2014-04-08 DIAGNOSIS — G9332 Myalgic encephalomyelitis/chronic fatigue syndrome: Secondary | ICD-10-CM | POA: Insufficient documentation

## 2014-04-08 DIAGNOSIS — I7300 Raynaud's syndrome without gangrene: Secondary | ICD-10-CM

## 2014-04-08 DIAGNOSIS — G4700 Insomnia, unspecified: Secondary | ICD-10-CM

## 2014-04-08 DIAGNOSIS — N3010 Interstitial cystitis (chronic) without hematuria: Secondary | ICD-10-CM

## 2014-04-08 DIAGNOSIS — M542 Cervicalgia: Secondary | ICD-10-CM

## 2014-04-08 DIAGNOSIS — IMO0001 Reserved for inherently not codable concepts without codable children: Secondary | ICD-10-CM

## 2014-04-08 DIAGNOSIS — R5382 Chronic fatigue, unspecified: Secondary | ICD-10-CM

## 2014-04-08 MED ORDER — NALOXEGOL OXALATE 25 MG PO TABS: 1.0000 | ORAL_TABLET | Freq: Every day | ORAL | Status: DC

## 2014-04-08 MED ORDER — TRAZODONE HCL 50 MG PO TABS: 25.0000 mg | ORAL_TABLET | Freq: Every day | ORAL | Status: DC

## 2014-04-08 NOTE — Patient Instructions (Addendum)
Flu shot today. Try movantik 25mg  in the morning on empty stomach for constipation. Hold linzess for now, may continue miralax and fiber. Look into integrative therapies in [place].  Continue meds as up to now - call us when you're due for embeda refill. Try trazodone for sleep - 1/2 tablet to start. Update controlled substance agreement today. Return in 2 months for annual exam.

## 2014-04-08 NOTE — Assessment & Plan Note (Signed)
Continue elmiron. uro - Dr Amalia Hailey.

## 2014-04-08 NOTE — Progress Notes (Signed)
Pre visit review using our clinic review tool, if applicable. No additional management support is needed unless otherwise documented below in the visit note. 

## 2014-04-08 NOTE — Assessment & Plan Note (Signed)
On adderall per prior PCP.

## 2014-04-08 NOTE — Assessment & Plan Note (Signed)
Continue f/u with rheum. Discussed exercise regimen, discussed possible referral to integrative therapies.

## 2014-04-08 NOTE — Assessment & Plan Note (Signed)
Discussed options - continue miralax, fiber. Trial movantik - sent in 25mg  to take in am with glass of water. Advised sxs to monitor for including obstruction sxs.

## 2014-04-08 NOTE — Assessment & Plan Note (Signed)
Discussed sleeping aides. lunesta losing effect. Has tried [place] and several other meds.  Will do trial of trazodone 25mg  nightly for insomnia.

## 2014-04-08 NOTE — Assessment & Plan Note (Addendum)
Known cervical DDD. Continue current pain regimen. Controlled substance agreement form filled out today.

## 2014-04-08 NOTE — Assessment & Plan Note (Signed)
Continue aspirin QOD and norvasc 5mg  daily.

## 2014-04-08 NOTE — Progress Notes (Signed)
BP 128/68  Pulse 88  Temp(Src) 98 F (36.7 C) (Tympanic)  Ht 5' 5.5" (1.664 m)  Wt 140 lb (63.504 kg)  BMI 22.93 kg/m2  SpO2 96%  LMP 07/25/1998   CC: transfer of care from Dr. [name]  Subjective:    Patient ID: Rachel Duran, female    DOB: April 04, 1956, 58 y.o.   MRN: [id number]  HPI: Rachel Duran is a 58 y.o. 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   Started on embeda 2/2 formulary changes per prior PCP's last note. "The patient has been stable on avinza but it has moved to the 3rd tier. She has failed most classes of pain medications due to constipation and slow transit and specifically has failed the IR and 12 hour mophines". Avinza worked well for her since 2004, but stopped being made, so was changed to embeda (10/2013).  Decided to start with lower dose but not getting as good pain control as with avinza. Significant narcotic induced constipation.  Severe insomnia - has tried [place], [place], xanax.  Has not tried trazodone. Scared to try belsomra due to side effects (prescribed by psychiatrist).  Has had ERCP x2, has had eval by [facility] as well.   On adderall XR daily for chronic fatigue, xanax for anxiety,   The b12 injection has been problematic due to only getting one vial at a time and takes it every three weeks.   On actigal for chronic biliary duct stones s/p cholecystectomy.  Rheum: [name] Psychiatrist: [facility] Surgery: Redmond Pulling [facility] Urology: Amalia Hailey  Preventative: Well woman with OBGYN in March. Flu shot - today pneumova 2011, prevnar 2015 Td 2007  Past Medical History  Diagnosis Date  . Disorders of porphyrin metabolism   . Irritable bowel syndrome   .  Fibromyalgia   . Arthritis   . GERD (gastroesophageal reflux disease)   . Allergy   . Chronic pain   . Cholecystitis   . Internal hemorrhoids   . Interstitial cystitis 06-06-12    hx.  . Raynauds disease     hx.  . 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 C6- C7 -hx. past fusion(bone graft used)  . Anemia   . PONV (postoperative nausea and vomiting)     now uses stomach blockers and no ponv  . Abdominal pain last 4 months    and nausea also  . Depression     bipolar depression    Relevant past medical, surgical, family and social history reviewed and updated as indicated.  Allergies and medications reviewed and updated. Current Outpatient Prescriptions on File Prior to Visit  Medication Sig  . acyclovir (ZOVIRAX) 800 MG tablet Take one-half tablet by  mouth every day  . ALPRAZolam (XANAX) 1 MG tablet Take 1 mg by mouth 3 (three) times daily as needed for anxiety.   Marland Kitchen amLODipine (NORVASC) 5 MG tablet Take 5 mg by mouth every morning. For [name]  . amphetamine-dextroamphetamine (ADDERALL XR) 30 MG 24 hr capsule Take 1 capsule (30 mg total) by mouth every morning.  . ARIPiprazole (ABILIFY) 10 MG tablet Take 5 mg by mouth at bedtime.   Marland Kitchen aspirin EC 81 MG tablet Take 81 mg by mouth every other day.   . baclofen (  LIORESAL) 10 MG tablet Take 10 mg by mouth 3 (three) times daily as needed.   Marland Kitchen co-enzyme Q-10 50 MG capsule Take 50 mg by mouth daily.  . cyanocobalamin (,VITAMIN B-12,) 1000 MCG/ML injection Inject 1,000 mcg into the muscle every 21 ( twenty-one) days.  . diclofenac sodium (VOLTAREN) 1 % GEL Apply 1 g topically 2 (two) times daily as needed (pain).   . EMBEDA 80-3.2 MG CPCR Take 1 capsule by mouth daily.  Marland Kitchen esomeprazole (NEXIUM) 40 MG capsule Take 1 capsule (40 mg total) by mouth 2 (two) times daily before a meal.  . Eszopiclone 3 MG TABS Take 1 tablet (3 mg total) by mouth at bedtime. Take immediately before bedtime  . hydrocortisone (ANUSOL-HC) 25 MG  suppository Place 25 mg rectally 2 (two) times daily as needed for hemorrhoids or itching.  . lamoTRIgine (LAMICTAL) 150 MG tablet Take 150 mg by mouth daily.   Marland Kitchen LINZESS 290 MCG CAPS capsule TAKE ONE CAPSULE BY MOUTH ONE TIME DAILY   . MAGNESIUM PO Take 1,000 mg by mouth 3 (three) times daily.  . Manganese 50 MG TABS Take 50 mg by mouth daily.   Marland Kitchen morphine (MSIR) 15 MG tablet Take 1 tablet (15 mg total) by mouth every 4 (four) hours as needed for severe pain.  Marland Kitchen ondansetron (ZOFRAN) 4 MG tablet Take 4 mg by mouth every 8 (eight) hours as needed for nausea or vomiting.  . pentosan polysulfate (ELMIRON) 100 MG capsule Take 200 mg by mouth 2 (two) times daily.   . Polyethylene Glycol 3350 (MIRALAX PO) Take 17 g by mouth 4 (four) times daily.   . pramoxine-hydrocortisone (PROCTOCREAM-HC) 1-1 % rectal cream Place 1 application rectally 2 (two) times daily.  . pregabalin (LYRICA) 150 MG capsule Take 150 mg by mouth 3 (three) times daily.   Marland Kitchen thiamine 100 MG tablet Take 100 mg by mouth daily.  . Triamcinolone Acetonide (NASACORT AQ NA) Place 2 sprays into the nose daily as needed (allergies).   . ursodiol (ACTIGALL) 300 MG capsule Take 1 capsule (300 mg total) by mouth 2 (two) times daily.  . [DISCONTINUED] [facility] SLOW RELEASE IRON 143 (45 FE) MG TBCR TAKE 1 TABLET EVERY DAY   No current facility-administered medications on file prior to visit.    Review of Systems Per HPI unless specifically indicated above    Objective:    BP 128/68  Pulse 88  Temp(Src) 98 F (36.7 C) (Tympanic)  Ht 5' 5.5" (1.664 m)  Wt 140 lb (63.504 kg)  BMI 22.93 kg/m2  SpO2 96%  LMP 07/25/1998  Physical Exam  Nursing note and vitals reviewed. Constitutional: She appears well-developed and well-nourished. No distress.  HENT:  Mouth/Throat: Oropharynx is clear and moist. No oropharyngeal exudate.  Eyes: Conjunctivae and EOM are normal. Pupils are equal, round, and reactive to light. No scleral icterus.  Neck:  Normal range of motion. Neck supple.  Cardiovascular: Normal rate, regular rhythm, normal heart sounds and intact distal pulses.   No murmur heard. Pulmonary/Chest: Effort normal and breath sounds normal. No respiratory distress. She has no wheezes. She has no rales.  Musculoskeletal: She exhibits no edema.  Lymphadenopathy:    She has no cervical adenopathy.  Skin: Skin is warm and dry. No rash noted.  Psychiatric: She has a normal mood and affect.       Assessment & Plan:   Problem List Items Addressed This Visit   [name] syndrome - Primary     Continue aspirin  QOD and norvasc 5mg  daily.    NECK PAIN, CHRONIC     Known cervical DDD. Continue current pain regimen. Controlled substance agreement form filled out today.    INSOMNIA, CHRONIC     Discussed sleeping aides. lunesta losing effect. Has tried [place] and several other meds.  Will do trial of trazodone 25mg  nightly for insomnia.    FIBROMYALGIA, SEVERE     Continue f/u with rheum. Discussed exercise regimen, discussed possible referral to integrative therapies.    Drug-induced constipation     Discussed options - continue miralax, fiber. Trial movantik - sent in 25mg  to take in am with glass of water. Advised sxs to monitor for including obstruction sxs.    CYSTITIS, CHRONIC INTERSTITIAL     Continue elmiron. uro - Dr Amalia Hailey.    CFS (chronic fatigue syndrome)     On adderall per prior PCP.        Follow up plan: Return in about 2 months (around 06/08/2014), or as needed, for annual exam, prior fasting for blood work.

## 2014-04-11 ENCOUNTER — Telehealth: Payer: Self-pay | Admitting: *Deleted

## 2014-04-11 NOTE — Telephone Encounter (Signed)
Pharmacy faxed prior auth request, and forms to the office. Forms completed by Dr. Darnell Level, faxed to [place], and placed on Kim's desk while awaiting response from ins company.

## 2014-04-25 ENCOUNTER — Encounter: Payer: Self-pay | Admitting: Family Medicine

## 2014-04-25 ENCOUNTER — Telehealth (INDEPENDENT_AMBULATORY_CARE_PROVIDER_SITE_OTHER): Payer: Self-pay

## 2014-04-25 NOTE — Telephone Encounter (Signed)
Pt is po hemorrhoidectomy by Dr. Redmond Pulling. She has a history of chronic pain and takes pain medication regularly, in addition to Tylenol for her post op pain.  Constipation is an issue.  She had not had a BM since surgery, so today she tried MOM.  She did have a soft, runny BM.  She is concerned about taking this long term.  Please advise.

## 2014-04-25 NOTE — Telephone Encounter (Signed)
MOM is ok for now. Advise her to take miralax daily and try fiber supplement like metamucil/benefiber (can't remember if she already takes it)

## 2014-04-28 ENCOUNTER — Other Ambulatory Visit: Payer: Self-pay

## 2014-04-28 MED ORDER — EMBEDA 80-3.2 MG PO CPCR: 1.0000 | ORAL_CAPSULE | Freq: Every day | ORAL | Status: DC

## 2014-04-28 NOTE — Telephone Encounter (Signed)
Printed and placed in Kim's box. 

## 2014-04-28 NOTE — Telephone Encounter (Signed)
Patient notified and Rx's placed up front for pick up. 

## 2014-04-28 NOTE — Telephone Encounter (Signed)
Pt left v/m; pt transferred to Dr Darnell Level from Dr [name] and pt request rx for Embeda x 3; pt has already discussed with Dr Darnell Level about picking up 3 rx at one time. Pt has one week med left but pharmacy has to order med so pt requesting early for that reason. Trazodone was not effective; pt tried for 2 weeks taking Trazodone; pt will discuss further with Dr Darnell Level at 06/10/14 appt. Pt request cb when embeda rxs ready for pick up.

## 2014-04-29 NOTE — Telephone Encounter (Signed)
Pt's bowels are back to normal.  BM's are soft.  Pt stopped MOM and increased her daily dose of Miralax, and is taking Benefiber daily.  Her PCP, Dr. Leo Grosser, has started her on a new medication, Movantik, for opiate induced constipation.  She is hoping this will help control her problem.  Pt encouraged to call any time with questions and/or concerns.

## 2014-04-29 NOTE — Telephone Encounter (Signed)
LMOV pt to return call for further instructions.  Please ask for Hansen Carino.

## 2014-05-15 ENCOUNTER — Encounter: Payer: Self-pay | Admitting: *Deleted

## 2014-05-26 ENCOUNTER — Encounter: Payer: Self-pay | Admitting: Family Medicine

## 2014-06-01 ENCOUNTER — Other Ambulatory Visit: Payer: Self-pay | Admitting: Family Medicine

## 2014-06-01 DIAGNOSIS — Z5181 Encounter for therapeutic drug level monitoring: Secondary | ICD-10-CM

## 2014-06-01 DIAGNOSIS — F32A Depression, unspecified: Secondary | ICD-10-CM

## 2014-06-01 DIAGNOSIS — Z13220 Encounter for screening for lipoid disorders: Secondary | ICD-10-CM

## 2014-06-01 DIAGNOSIS — R5382 Chronic fatigue, unspecified: Secondary | ICD-10-CM

## 2014-06-01 DIAGNOSIS — G9332 Myalgic encephalomyelitis/chronic fatigue syndrome: Secondary | ICD-10-CM

## 2014-06-01 DIAGNOSIS — D638 Anemia in other chronic diseases classified elsewhere: Secondary | ICD-10-CM

## 2014-06-01 DIAGNOSIS — F329 Major depressive disorder, single episode, unspecified: Secondary | ICD-10-CM

## 2014-06-01 DIAGNOSIS — R7303 Prediabetes: Secondary | ICD-10-CM

## 2014-06-03 ENCOUNTER — Other Ambulatory Visit (INDEPENDENT_AMBULATORY_CARE_PROVIDER_SITE_OTHER): Payer: Medicare Other

## 2014-06-03 DIAGNOSIS — R5382 Chronic fatigue, unspecified: Secondary | ICD-10-CM

## 2014-06-03 DIAGNOSIS — R7309 Other abnormal glucose: Secondary | ICD-10-CM

## 2014-06-03 DIAGNOSIS — R7303 Prediabetes: Secondary | ICD-10-CM

## 2014-06-03 DIAGNOSIS — G9332 Myalgic encephalomyelitis/chronic fatigue syndrome: Secondary | ICD-10-CM

## 2014-06-03 DIAGNOSIS — D638 Anemia in other chronic diseases classified elsewhere: Secondary | ICD-10-CM

## 2014-06-03 LAB — CBC WITH DIFFERENTIAL/PLATELET
BASOS ABS: 0 10*3/uL (ref 0.0–0.1)
Basophils Relative: 0.7 % (ref 0.0–3.0)
EOS ABS: 0.2 10*3/uL (ref 0.0–0.7)
Eosinophils Relative: 2.5 % (ref 0.0–5.0)
HCT: 37.6 % (ref 36.0–46.0)
Hemoglobin: 12.2 g/dL (ref 12.0–15.0)
LYMPHS PCT: 31.7 % (ref 12.0–46.0)
Lymphs Abs: 2.1 10*3/uL (ref 0.7–4.0)
MCHC: 32.3 g/dL (ref 30.0–36.0)
MCV: 88.2 fl (ref 78.0–100.0)
Monocytes Absolute: 0.6 10*3/uL (ref 0.1–1.0)
Monocytes Relative: 8.7 % (ref 3.0–12.0)
Neutro Abs: 3.7 10*3/uL (ref 1.4–7.7)
Neutrophils Relative %: 56.4 % (ref 43.0–77.0)
Platelets: 290 10*3/uL (ref 150.0–400.0)
RBC: 4.26 Mil/uL (ref 3.87–5.11)
RDW: 14.8 % (ref 11.5–15.5)
WBC: 6.5 10*3/uL (ref 4.0–10.5)

## 2014-06-03 LAB — COMPREHENSIVE METABOLIC PANEL
ALT: 24 U/L (ref 0–35)
AST: 33 U/L (ref 0–37)
Albumin: 3.5 g/dL (ref 3.5–5.2)
Alkaline Phosphatase: 81 U/L (ref 39–117)
BUN: 9 mg/dL (ref 6–23)
CO2: 23 meq/L (ref 19–32)
Calcium: 9.7 mg/dL (ref 8.4–10.5)
Chloride: 105 mEq/L (ref 96–112)
Creatinine, Ser: 0.7 mg/dL (ref 0.4–1.2)
GFR: 90.39 mL/min (ref >60.00)
Glucose, Bld: 113 mg/dL — ABNORMAL HIGH (ref 70–99)
POTASSIUM: 3.7 meq/L (ref 3.5–5.1)
SODIUM: 143 meq/L (ref 135–145)
TOTAL PROTEIN: 6.7 g/dL (ref 6.0–8.3)
Total Bilirubin: 0.4 mg/dL (ref 0.2–1.2)

## 2014-06-03 LAB — TSH: TSH: 4.69 u[IU]/mL — AB (ref 0.35–4.50)

## 2014-06-03 LAB — HEMOGLOBIN A1C: HEMOGLOBIN A1C: 5.8 % (ref 4.6–6.5)

## 2014-06-10 ENCOUNTER — Other Ambulatory Visit: Payer: Self-pay | Admitting: Gastroenterology

## 2014-06-10 ENCOUNTER — Encounter: Payer: Self-pay | Admitting: Family Medicine

## 2014-06-10 ENCOUNTER — Ambulatory Visit (INDEPENDENT_AMBULATORY_CARE_PROVIDER_SITE_OTHER): Payer: Medicare Other | Admitting: Family Medicine

## 2014-06-10 VITALS — BP 122/78 | HR 80 | Temp 97.7°F | Ht 65.5 in | Wt 142.0 lb

## 2014-06-10 DIAGNOSIS — G9332 Myalgic encephalomyelitis/chronic fatigue syndrome: Secondary | ICD-10-CM

## 2014-06-10 DIAGNOSIS — Z7189 Other specified counseling: Secondary | ICD-10-CM

## 2014-06-10 DIAGNOSIS — K5903 Drug induced constipation: Secondary | ICD-10-CM

## 2014-06-10 DIAGNOSIS — G4700 Insomnia, unspecified: Secondary | ICD-10-CM

## 2014-06-10 DIAGNOSIS — M797 Fibromyalgia: Secondary | ICD-10-CM

## 2014-06-10 DIAGNOSIS — G894 Chronic pain syndrome: Secondary | ICD-10-CM

## 2014-06-10 DIAGNOSIS — K5909 Other constipation: Secondary | ICD-10-CM

## 2014-06-10 DIAGNOSIS — Z0000 Encounter for general adult medical examination without abnormal findings: Secondary | ICD-10-CM | POA: Insufficient documentation

## 2014-06-10 DIAGNOSIS — R5382 Chronic fatigue, unspecified: Secondary | ICD-10-CM

## 2014-06-10 DIAGNOSIS — R946 Abnormal results of thyroid function studies: Secondary | ICD-10-CM

## 2014-06-10 MED ORDER — MORPHINE SULFATE 15 MG PO TABS: 15.0000 mg | ORAL_TABLET | ORAL | Status: DC | PRN

## 2014-06-10 MED ORDER — MORPHINE-NALTREXONE 100-4 MG PO CPCR: 1.0000 | ORAL_CAPSULE | Freq: Every day | ORAL | Status: DC

## 2014-06-10 MED ORDER — AMPHETAMINE-DEXTROAMPHET ER 30 MG PO CP24
30.0000 mg | ORAL_CAPSULE | ORAL | Status: DC
Start: 2014-06-10 — End: 2014-08-25

## 2014-06-10 MED ORDER — AMITRIPTYLINE HCL 25 MG PO TABS: 25.0000 mg | ORAL_TABLET | Freq: Every day | ORAL | Status: DC

## 2014-06-10 NOTE — Assessment & Plan Note (Signed)
Continue f/u with rheum and PT. Discussed temporary increase in embeda to 100/4mg  dose. Refilled today.

## 2014-06-10 NOTE — Assessment & Plan Note (Addendum)
Recent worsening chronic pain flare over last 7 weeks.  Discussed temporary increase in embeda to 100/4mg  dose. Refilled MSIR and embeda.

## 2014-06-10 NOTE — Patient Instructions (Addendum)
Let's recheck thyroid levels today. I've refilled medicines - ok to try temporary higher dose of embeda. Let's retry amitriptyline - start 25mg  nightly. If after 4-7 days no noted improvement may increase to 50mg  nightly. Bring me copy of advanced directive. Return in 3 months for follow up.

## 2014-06-10 NOTE — Assessment & Plan Note (Signed)

## 2014-06-10 NOTE — Assessment & Plan Note (Signed)
Chronic issue, several hypnotics have lost effectiveness. Trazodone ineffective in the past. Pt interested in trial of amitriptyline - as it may have worked well in past. Will start amitriptyline 25mg  with titration to 50mg  if needed.

## 2014-06-10 NOTE — Progress Notes (Signed)
BP 122/78 mmHg  Pulse 80  Temp(Src) 97.7 F (36.5 C) (Oral)  Ht 5' 5.5" (1.664 m)  Wt 142 lb (64.411 kg)  BMI 23.26 kg/m2  LMP 07/25/1998   CC: medicare wellness visit  Subjective:    Patient ID: Rachel Duran, female    DOB: 1956/04/23, 58 y.o.   MRN: [id number]  HPI: Rachel Duran is a 58 y.o. female presenting on 06/10/2014 for Annual Exam   Patient with IBS with FM, chronic pain complicated by autoimmune rash and raynaud's phenomenon, depression, constipation, GERD, interstitial cystitis, chronic gallbladder stones s/p cholecystectomy on actigall, porphyria, cervical DDD s/p fusion, chronic pain from FM/OA/IC and DDD  "Rough last 7 weeks" with FM flare - hips, knees, hands, ankles. Doing PT per rheum recs for last 5 wks. Trouble sleeping. On MTX and plaquenil in the psat. She has been using more MSIR pain.   Insomnia - lunesta lost effect, then trazodone did not help. Trouble with initiation as well as maintenance insomnia. Asks about retrial of amitriptyline.  Chronic pain - prior on avanza (came off the market) and changed to embeda. Asks about increased embeda to 100mg  dose due to FM flare over last 3 months.   Opiate induced constipation - movantik helping some but unsure which helped better (between this and linzess). Continues miralax/benefiber.   Reum: [name] Psychiatrist: [facility] Surgery: Redmond Pulling [facility] Urology: Amalia Hailey  Preventative: Well woman with OBGYN sees every March. Normal mammograms Flex sigmoidoscopy - 11/2006 [facility]) rec rpt 10 yrs dexa - 2014 with mild osteopenia per Dr Quincy Simmonds Flu shot - today pneumova 2011, prevnar 2015 Td 2007 Advanced directives: has at home, will bring me copy. HCPOA is husband.  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  Allergies and medications reviewed and updated. Current Outpatient Prescriptions on File Prior to Visit  Medication Sig  . acyclovir (ZOVIRAX) 800 MG tablet Take one-half tablet by  mouth every day  . ALPRAZolam (XANAX) 1 MG tablet Take 1 mg by mouth 3 (three) times daily as needed for anxiety.   Marland Kitchen amLODipine (NORVASC) 5 MG tablet Take 5 mg by mouth every morning. For [name]  . ARIPiprazole (ABILIFY) 10 MG tablet Take 5 mg by mouth at bedtime.   Marland Kitchen aspirin EC 81 MG tablet Take 81 mg by mouth every other day.   . baclofen (LIORESAL) 10 MG tablet Take 10 mg by mouth 3 (three) times daily as needed.   . Calcium-Magnesium-Vitamin D [id number] MG-MG-UNIT TB24 Take 1 tablet by mouth 2 (two) times daily.  Marland Kitchen co-enzyme Q-10 50 MG capsule Take 50 mg by mouth daily.  . cyanocobalamin (,VITAMIN B-12,) 1000 MCG/ML injection Inject 1,000 mcg into the muscle every 21 ( twenty-one) days.  . diclofenac sodium (VOLTAREN) 1 % GEL Apply 1 g topically 2 (two) times daily as needed (pain).   Marland Kitchen esomeprazole (NEXIUM) 40 MG capsule Take 1 capsule (40 mg total) by mouth 2 (two) times daily before a meal.  . Estradiol 10 MCG TABS vaginal tablet Place 1 tablet vaginally every Tuesday, Thursday, and Saturday at 6 PM. vagifem  . hydrocortisone (ANUSOL-HC) 25 MG suppository Place 25 mg rectally 2 (two) times daily as needed for hemorrhoids or itching.  . lamoTRIgine (LAMICTAL) 150 MG tablet Take 150 mg by  mouth daily.   Marland Kitchen LINZESS 290 MCG CAPS capsule TAKE ONE CAPSULE BY MOUTH ONE TIME DAILY   . MAGNESIUM PO Take 1,000 mg by mouth 3 (three) times daily.  . Manganese 50 MG TABS Take 50 mg by mouth daily.   Kurtis Bushman Oxalate (MOVANTIK) 25 MG TABS Take 1 tablet by mouth daily.  . ondansetron (ZOFRAN) 4 MG tablet Take 4 mg by mouth every 8 (eight) hours as needed for nausea or vomiting.  . pentosan polysulfate (ELMIRON) 100 MG capsule Take 200 mg by mouth 2 (two) times  daily.   . Polyethylene Glycol 3350 (MIRALAX PO) Take 17 g by mouth 4 (four) times daily.   . pregabalin (LYRICA) 150 MG capsule Take 150 mg by mouth 3 (three) times daily.   Marland Kitchen thiamine 100 MG tablet Take 100 mg by mouth daily.  . traZODone (DESYREL) 50 MG tablet Take 0.5 tablets (25 mg total) by mouth at bedtime.  . Triamcinolone Acetonide (NASACORT AQ NA) Place 2 sprays into the nose daily as needed (allergies).   . [DISCONTINUED] [facility] SLOW RELEASE IRON 143 (45 FE) MG TBCR TAKE 1 TABLET EVERY DAY   No current facility-administered medications on file prior to visit.    Review of Systems Per HPI unless specifically indicated above    Objective:    BP 122/78 mmHg  Pulse 80  Temp(Src) 97.7 F (36.5 C) (Oral)  Ht 5' 5.5" (1.664 m)  Wt 142 lb (64.411 kg)  BMI 23.26 kg/m2  LMP 07/25/1998  Physical Exam  Constitutional: She is oriented to person, place, and time. She appears well-developed and well-nourished. No distress.  HENT:  Head: Normocephalic and atraumatic.  Right Ear: Hearing, tympanic membrane, external ear and ear canal normal.  Left Ear: Hearing, tympanic membrane, external ear and ear canal normal.  Nose: Nose normal.  Mouth/Throat: Uvula is midline, oropharynx is clear and moist and mucous membranes are normal. No oropharyngeal exudate, posterior oropharyngeal edema or posterior oropharyngeal erythema.  Eyes: Conjunctivae and EOM are normal. Pupils are equal, round, and reactive to light. No scleral icterus.  Neck: Normal range of motion. Neck supple.  Cardiovascular: Normal rate, regular rhythm, normal heart sounds and intact distal pulses.   No murmur heard. Pulses:      Radial pulses are 2+ on the right side, and 2+ on the left side.  Pulmonary/Chest: Effort normal and breath sounds normal. No respiratory distress. She has no wheezes. She has no rales.  Abdominal: Soft. Bowel sounds are normal. She exhibits no distension and no mass. There is no tenderness. There is  no rebound and no guarding.  Musculoskeletal: Normal range of motion. She exhibits no edema.  Lymphadenopathy:    She has no cervical adenopathy.  Neurological: She is alert and oriented to person, place, and time.  CN grossly intact, station and gait intact  Skin: Skin is warm and dry. No rash noted.  Psychiatric: She has a normal mood and affect. Her behavior is normal. Judgment and thought content normal.  Nursing note and vitals reviewed.  Results for orders placed or performed in visit on 06/03/14  Comprehensive metabolic panel  Result Value Ref Range   Sodium 143 135 - 145 mEq/L   Potassium 3.7 3.5 - 5.1 mEq/L   Chloride 105 96 - 112 mEq/L   CO2 23 19 - 32 mEq/L   Glucose, Bld 113 (H) 70 - 99 mg/dL   BUN 9 6 - 23 mg/dL   Creatinine, Ser 0.7 0.4 -  1.2 mg/dL   Total Bilirubin 0.4 0.2 - 1.2 mg/dL   Alkaline Phosphatase 81 39 - 117 U/L   AST 33 0 - 37 U/L   ALT 24 0 - 35 U/L   Total Protein 6.7 6.0 - 8.3 g/dL   Albumin 3.5 3.5 - 5.2 g/dL   Calcium 9.7 8.4 - 10.5 mg/dL   GFR 90.39 >60.00 mL/min  Hemoglobin A1c  Result Value Ref Range   Hgb A1c MFr Bld 5.8 4.6 - 6.5 %  CBC with Differential  Result Value Ref Range   WBC 6.5 4.0 - 10.5 K/uL   RBC 4.26 3.87 - 5.11 Mil/uL   Hemoglobin 12.2 12.0 - 15.0 g/dL   HCT 37.6 36.0 - 46.0 %   MCV 88.2 78.0 - 100.0 fl   MCHC 32.3 30.0 - 36.0 g/dL   RDW 14.8 11.5 - 15.5 %   Platelets 290.0 150.0 - 400.0 K/uL   Neutrophils Relative % 56.4 43.0 - 77.0 %   Lymphocytes Relative 31.7 12.0 - 46.0 %   Monocytes Relative 8.7 3.0 - 12.0 %   Eosinophils Relative 2.5 0.0 - 5.0 %   Basophils Relative 0.7 0.0 - 3.0 %   Neutro Abs 3.7 1.4 - 7.7 K/uL   Lymphs Abs 2.1 0.7 - 4.0 K/uL   Monocytes Absolute 0.6 0.1 - 1.0 K/uL   Eosinophils Absolute 0.2 0.0 - 0.7 K/uL   Basophils Absolute 0.0 0.0 - 0.1 K/uL  TSH  Result Value Ref Range   TSH 4.69 (H) 0.35 - 4.50 uIU/mL      Assessment & Plan:   Problem List Items Addressed This Visit     Medicare annual wellness visit, initial - Primary    I have personally reviewed the Medicare Annual Wellness questionnaire and have noted 1. The patient's medical and social history 2. Their use of alcohol, tobacco or illicit drugs 3. Their current medications and supplements 4. The patient's functional ability including ADL's, fall risks, home safety risks and hearing or visual impairment. 5. Diet and physical activity 6. Evidence for depression or mood disorders The patients weight, height, BMI have been recorded in the chart.  Hearing and vision has been addressed. I have made referrals, counseling and provided education to the patient based review of the above and I have provided the pt with a written personalized care plan for preventive services. Provider list updated - see scanned questionairre.  Reviewed preventative protocols and updated unless pt declined.    INSOMNIA, CHRONIC    Chronic issue, several hypnotics have lost effectiveness. Trazodone ineffective in the past. Pt interested in trial of amitriptyline - as it may have worked well in past. Will start amitriptyline 25mg  with titration to 50mg  if needed.    Fibromyalgia    Continue f/u with rheum and PT. Discussed temporary increase in embeda to 100/4mg  dose. Refilled today.    Drug-induced constipation    Discussed linzess vs movantik - will continue movantik for now.    Chronic pain syndrome    Recent worsening chronic pain flare over last 7 weeks.  Discussed temporary increase in embeda to 100/4mg  dose. Refilled MSIR and embeda.    CFS (chronic fatigue syndrome)    Refilled adderall today.    Advanced care planning/counseling discussion    Advanced directives: has at home, will bring me copy. HCPOA is husband.    Abnormal thyroid function test    Check TFTs today.    Relevant Orders      T3  T4, free      TSH       Follow up plan: Return in about 3 months (around 09/10/2014), or as needed, for  follow up visit.

## 2014-06-10 NOTE — Assessment & Plan Note (Signed)
Refilled adderall today.

## 2014-06-10 NOTE — Progress Notes (Signed)
Pre visit review using our clinic review tool, if applicable. No additional management support is needed unless otherwise documented below in the visit note. 

## 2014-06-10 NOTE — Assessment & Plan Note (Signed)
Discussed linzess vs movantik - will continue movantik for now.

## 2014-06-10 NOTE — Assessment & Plan Note (Deleted)
Preventative protocols reviewed and updated unless pt declined. Discussed healthy diet and lifestyle.  

## 2014-06-10 NOTE — Assessment & Plan Note (Signed)
Advanced directives: has at home, will bring me copy. HCPOA is husband.

## 2014-06-10 NOTE — Assessment & Plan Note (Signed)
Check TFTs today

## 2014-06-11 LAB — TSH: TSH: 3.61 u[IU]/mL (ref 0.35–4.50)

## 2014-06-11 LAB — T4, FREE: FREE T4: 0.87 ng/dL (ref 0.60–1.60)

## 2014-06-11 LAB — T3: T3 TOTAL: 161.4 ng/dL (ref 80.0–204.0)

## 2014-06-30 ENCOUNTER — Telehealth: Payer: Self-pay | Admitting: Obstetrics and Gynecology

## 2014-06-30 NOTE — Telephone Encounter (Addendum)
FYI: Pt is calling to let her provider know there will be a fax from [employer] for Vagifem. It will require prior authorization

## 2014-07-13 ENCOUNTER — Encounter: Payer: Self-pay | Admitting: Family Medicine

## 2014-07-14 MED ORDER — LINACLOTIDE 290 MCG PO CAPS: 290.0000 ug | ORAL_CAPSULE | Freq: Every day | ORAL | Status: DC

## 2014-07-21 ENCOUNTER — Encounter: Payer: Self-pay | Admitting: Family Medicine

## 2014-07-21 MED ORDER — BACLOFEN 10 MG PO TABS: 10.0000 mg | ORAL_TABLET | Freq: Three times a day (TID) | ORAL | Status: DC | PRN

## 2014-07-21 MED ORDER — ACYCLOVIR 800 MG PO TABS: ORAL_TABLET | ORAL | Status: DC

## 2014-08-10 ENCOUNTER — Encounter: Payer: Self-pay | Admitting: Family Medicine

## 2014-08-11 ENCOUNTER — Telehealth: Payer: Self-pay | Admitting: Gastroenterology

## 2014-08-11 MED ORDER — ESOMEPRAZOLE MAGNESIUM 40 MG PO CPDR: 40.0000 mg | DELAYED_RELEASE_CAPSULE | Freq: Two times a day (BID) | ORAL | Status: DC

## 2014-08-11 MED ORDER — MORPHINE-NALTREXONE 80-3.2 MG PO CPCR: 1.0000 | ORAL_CAPSULE | Freq: Every day | ORAL | Status: DC

## 2014-08-11 NOTE — Telephone Encounter (Signed)
Filled lower dose and in Kim's box. I don't see where she notified us about needing lower dose 2/2 constipation.

## 2014-08-11 NOTE — Telephone Encounter (Signed)
It was in a Mychart note on 07/13/14. I must've neglected to forward it to you.   Patient notified via Mychart and Rx placed up front for pick up.

## 2014-08-11 NOTE — Telephone Encounter (Signed)
Prescription generic Nexium was sent to patient's pharmacy.

## 2014-08-24 ENCOUNTER — Encounter: Payer: Self-pay | Admitting: Family Medicine

## 2014-08-25 MED ORDER — AMPHETAMINE-DEXTROAMPHET ER 30 MG PO CP24: 30.0000 mg | ORAL_CAPSULE | ORAL | Status: DC

## 2014-08-25 NOTE — Telephone Encounter (Signed)
Printed and in Kim's box. plz notify patient.

## 2014-08-29 ENCOUNTER — Other Ambulatory Visit: Payer: Self-pay

## 2014-08-29 DIAGNOSIS — Z1231 Encounter for screening mammogram for malignant neoplasm of breast: Secondary | ICD-10-CM

## 2014-09-11 ENCOUNTER — Ambulatory Visit (INDEPENDENT_AMBULATORY_CARE_PROVIDER_SITE_OTHER): Payer: Medicare Other | Admitting: Family Medicine

## 2014-09-11 ENCOUNTER — Encounter: Payer: Self-pay | Admitting: Family Medicine

## 2014-09-11 VITALS — BP 126/78 | HR 102 | Temp 98.3°F | Wt 144.2 lb

## 2014-09-11 DIAGNOSIS — M797 Fibromyalgia: Secondary | ICD-10-CM

## 2014-09-11 DIAGNOSIS — G4700 Insomnia, unspecified: Secondary | ICD-10-CM

## 2014-09-11 DIAGNOSIS — R5382 Chronic fatigue, unspecified: Secondary | ICD-10-CM

## 2014-09-11 DIAGNOSIS — G9332 Myalgic encephalomyelitis/chronic fatigue syndrome: Secondary | ICD-10-CM

## 2014-09-11 DIAGNOSIS — M722 Plantar fascial fibromatosis: Secondary | ICD-10-CM

## 2014-09-11 DIAGNOSIS — M5010 Cervical disc disorder with radiculopathy, unspecified cervical region: Secondary | ICD-10-CM

## 2014-09-11 DIAGNOSIS — G894 Chronic pain syndrome: Secondary | ICD-10-CM

## 2014-09-11 DIAGNOSIS — K219 Gastro-esophageal reflux disease without esophagitis: Secondary | ICD-10-CM

## 2014-09-11 DIAGNOSIS — M659 Synovitis and tenosynovitis, unspecified: Secondary | ICD-10-CM

## 2014-09-11 MED ORDER — MORPHINE-NALTREXONE 80-3.2 MG PO CPCR: 1.0000 | ORAL_CAPSULE | Freq: Every day | ORAL | Status: DC

## 2014-09-11 MED ORDER — DOXEPIN HCL 3 MG PO TABS: 3.0000 mg | ORAL_TABLET | Freq: Every day | ORAL | Status: DC

## 2014-09-11 MED ORDER — PREDNISONE 20 MG PO TABS
ORAL_TABLET | ORAL | Status: DC
Start: 2014-09-11 — End: 2014-10-03

## 2014-09-11 NOTE — Progress Notes (Signed)
Pre visit review using our clinic review tool, if applicable. No additional management support is needed unless otherwise documented below in the visit note. 

## 2014-09-11 NOTE — Assessment & Plan Note (Addendum)
R sided sxs recurring - has f/u with rheum next month. Prednisone course should help.

## 2014-09-11 NOTE — Patient Instructions (Addendum)
Check with Dr Amalia Hailey about silenor 3mg  for sleep.  Let's try course of prednisone for next few weeks to try and break cycle of inflammation.  For plantar fasciitis - try stretches provided today, frozen water bottle massage, and try heel lifts OTC. Price out 90 day supply for embeda - if unable to do, bring back and we will continue 30 day supplies. Good to see you today.  RTC 3-4 months for follow up.

## 2014-09-11 NOTE — Progress Notes (Signed)
BP 126/78 mmHg  Pulse 102  Temp(Src) 98.3 F (36.8 C) (Oral)  Wt 144 lb 4 oz (65.431 kg)  LMP 07/25/1998   CC: 3 mo f/u visit  Subjective:    Patient ID: Rachel Duran, female    DOB: 1956-01-16, 59 y.o.   MRN: [id number]  HPI: Rachel Duran is a 59 y.o. 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  Last visit we retrialed amitritpyline 25-50mg  nightly for chronic insomnia. Dr Amalia Hailey recommended against this 2/2 worsening IC. She has been using xanax to fall sleep but awakens after 1 hour with hip pain. Benadryl and melatonin ineffective. ambien and [place] helped but would lose effect.   CFS - on adderall XR daily which is some helpful.  Chronic pain from FM - on disability for this. Prior on avanza (came off the market) so changed to embeda. Also on MSIR prn. Asks about increased embeda to 100mg  dose due to FM flare over last 3 months. She is seeing PT at [facility]. Feels like she's been in a FM flare for the last 7 months.   Significant pain at soles of feet - worse first few steps of day. Ongoing for last several months Also with persistent bilateral hip pain. Ongoing for last 8 months. Noticing some numbness in R hand upon awakening from elbow down to thumb over the last 2 weeks.   Opiate induced constipation - movantik helping some but unsure which helped better (between this and linzess). Continues miralax/benefiber.   Relevant past medical, surgical, family and social history reviewed and updated as indicated. Interim medical history since our last visit reviewed. Allergies and medications reviewed and updated. Current Outpatient Prescriptions  on File Prior to Visit  Medication Sig  . acyclovir (ZOVIRAX) 800 MG tablet Take one-half tablet by  mouth every day  . ALPRAZolam (XANAX) 1 MG tablet Take 1 mg by mouth 3 (three) times daily as needed for anxiety.   Marland Kitchen amLODipine (NORVASC) 5 MG tablet Take 5 mg by mouth every morning. For [name]  . amphetamine-dextroamphetamine (ADDERALL XR) 30 MG 24 hr capsule Take 1 capsule (30 mg total) by mouth every morning.  . ARIPiprazole (ABILIFY) 10 MG tablet Take 5 mg by mouth at bedtime.   Marland Kitchen aspirin EC 81 MG tablet Take 81 mg by mouth every other day.   . baclofen (LIORESAL) 10 MG tablet Take 1 tablet (10 mg total) by mouth 3 (three) times daily as needed.  . Calcium-Magnesium-Vitamin D 951-88-416 MG-MG-UNIT TB24 Take 1 tablet by mouth 2 (two) times daily.  Marland Kitchen co-enzyme Q-10 50 MG capsule Take 50 mg by mouth daily.  . cyanocobalamin (,VITAMIN B-12,) 1000 MCG/ML injection Inject 1,000 mcg into the muscle every 21 ( twenty-one) days.  . diclofenac sodium (VOLTAREN) 1 % GEL Apply 1 g topically 2 (two) times daily as needed (pain).   Marland Kitchen esomeprazole (NEXIUM) 40 MG capsule Take 1 capsule (40 mg total) by mouth 2 (two) times daily before a meal.  . Estradiol 10 MCG TABS vaginal tablet Place 1 tablet vaginally every Tuesday, Thursday, and Saturday at 6 PM. vagifem  . hydrocortisone (ANUSOL-HC) 25 MG suppository Place 25 mg rectally 2 (two) times daily as needed for hemorrhoids or  itching.  . lamoTRIgine (LAMICTAL) 150 MG tablet Take 150 mg by mouth daily.   . Linaclotide (LINZESS) 290 MCG CAPS capsule Take 1 capsule (290 mcg total) by mouth daily.  Marland Kitchen MAGNESIUM PO Take 1,000 mg by mouth 3 (three) times daily.  . Manganese 50 MG TABS Take 50 mg by mouth daily.   Marland Kitchen morphine (MSIR) 15 MG tablet Take 1 tablet (15 mg total) by mouth every 4 (four) hours as needed for severe pain.  Marland Kitchen ondansetron (ZOFRAN) 4 MG tablet Take 4 mg by mouth every 8 (eight) hours as needed for nausea or vomiting.  . pentosan polysulfate  (ELMIRON) 100 MG capsule Take 200 mg by mouth 2 (two) times daily.   . Polyethylene Glycol 3350 (MIRALAX PO) Take 17 g by mouth 4 (four) times daily.   . pregabalin (LYRICA) 150 MG capsule Take 150 mg by mouth 3 (three) times daily.   Marland Kitchen thiamine 100 MG tablet Take 100 mg by mouth daily.  . traZODone (DESYREL) 50 MG tablet Take 0.5 tablets (25 mg total) by mouth at bedtime.  . Triamcinolone Acetonide (NASACORT AQ NA) Place 2 sprays into the nose daily as needed (allergies).   . ursodiol (ACTIGALL) 300 MG capsule TAKE ONE CAPSULE BY MOUTH TWICE DAILY   . [DISCONTINUED] CVS SLOW RELEASE IRON 143 (45 FE) MG TBCR TAKE 1 TABLET EVERY DAY   No current facility-administered medications on file prior to visit.    Review of Systems Per HPI unless specifically indicated above     Objective:    BP 126/78 mmHg  Pulse 102  Temp(Src) 98.3 F (36.8 C) (Oral)  Wt 144 lb 4 oz (65.431 kg)  LMP 07/25/1998  Wt Readings from Last 3 Encounters:  09/11/14 144 lb 4 oz (65.431 kg)  06/10/14 142 lb (64.411 kg)  04/08/14 140 lb (63.504 kg)    Physical Exam  Constitutional: She appears well-developed and well-nourished. No distress.  HENT:  Mouth/Throat: Oropharynx is clear and moist. No oropharyngeal exudate.  Eyes: Conjunctivae and EOM are normal. Pupils are equal, round, and reactive to light. No scleral icterus.  Cardiovascular: Normal rate, regular rhythm, normal heart sounds and intact distal pulses.   No murmur heard. Pulmonary/Chest: Effort normal and breath sounds normal. No respiratory distress. She has no wheezes. She has no rales.  Musculoskeletal: She exhibits no edema.  Tender to palpation bilateral soles/heels also tender to palpation at midfoot sole 2+ DP bilaterally Thickening of heel  Skin: Skin is warm and dry. No rash noted.  Psychiatric: She has a normal mood and affect.  Nursing note and vitals reviewed.  Results for orders placed or performed in visit on 06/10/14  T3  Result  Value Ref Range   T3, Total 161.4 80.0 - 204.0 ng/dL  T4, free  Result Value Ref Range   Free T4 0.87 0.60 - 1.60 ng/dL  TSH  Result Value Ref Range   TSH 3.61 0.35 - 4.50 uIU/mL      Assessment & Plan:   Problem List Items Addressed This Visit    Synovitis and tenosynovitis    B hip pain - prednisone course. F/u with rheum      Relevant Medications   Morphine-Naltrexone 80-3.2 MG CPCR   predniSONE (DELTASONE) tablet   Plantar fasciitis, bilateral - Primary    Describes plantar fasciitis bilaterally - discussed treatment including stretching, heel lift, and frozen water bottle massages. Update if not improving with this.      INSOMNIA, CHRONIC  Will check with Dr Amalia Hailey about low dose doxepin for sleep maintenance insomnia. Failed several other sleeping aides.      GERD    Discussed PPI concerns - will continue for now.      Fibromyalgia    Continue embeda. Will price out 90d supply.      Chronic pain syndrome    Will price out embeda 90d supply.      CFS (chronic fatigue syndrome)    Continue adderall XR.      Cervical disc disorder with radiculopathy of cervical region    R sided sxs recurring - has f/u with rheum next month. Prednisone course should help.          Follow up plan: Return in about 3 months (around 12/10/2014) for follow up visit.

## 2014-09-11 NOTE — Assessment & Plan Note (Signed)
Describes plantar fasciitis bilaterally - discussed treatment including stretching, heel lift, and frozen water bottle massages. Update if not improving with this.

## 2014-09-11 NOTE — Assessment & Plan Note (Signed)
B hip pain - prednisone course. F/u with rheum

## 2014-09-11 NOTE — Assessment & Plan Note (Signed)
Discussed PPI concerns - will continue for now.

## 2014-09-11 NOTE — Assessment & Plan Note (Signed)
Continue embeda. Will price out 90d supply.

## 2014-09-11 NOTE — Assessment & Plan Note (Signed)
Continue adderall XR.

## 2014-09-11 NOTE — Assessment & Plan Note (Signed)
Will check with Dr Amalia Hailey about low dose doxepin for sleep maintenance insomnia. Failed several other sleeping aides.

## 2014-09-11 NOTE — Assessment & Plan Note (Signed)
Will price out embeda 90d supply.

## 2014-09-13 ENCOUNTER — Encounter: Payer: Self-pay | Admitting: Family Medicine

## 2014-09-15 ENCOUNTER — Telehealth: Payer: Self-pay | Admitting: *Deleted

## 2014-09-15 NOTE — Telephone Encounter (Signed)
have we gotten PA for this? thanks

## 2014-09-15 NOTE — Telephone Encounter (Signed)
PA required for Silenor. Form in your IN box for completion.

## 2014-09-16 ENCOUNTER — Encounter: Payer: Self-pay | Admitting: Family Medicine

## 2014-09-16 NOTE — Telephone Encounter (Signed)
Filled and in Kim's box. May not be approved - if not approved, may need to try belsomra, or rozerem first.

## 2014-09-16 NOTE — Telephone Encounter (Signed)
Form faxed. Will await determination. 

## 2014-09-24 ENCOUNTER — Telehealth: Payer: Self-pay | Admitting: Obstetrics and Gynecology

## 2014-09-24 NOTE — Telephone Encounter (Signed)
Appointment moved to 12/19/14. I noted this change.

## 2014-09-24 NOTE — Telephone Encounter (Signed)
I would push her annual exam forward to mid May 2016. I wish her a quick recovery!

## 2014-09-24 NOTE — Telephone Encounter (Signed)
Silenor denied. Patient notified via [place]. Will wait to see what she decides she wants to do.

## 2014-09-24 NOTE — Telephone Encounter (Signed)
Patient is having breast surgery two weeks before her aex 11/06/14. Patient is wondering if Dr.Silva will be able to complete her breast exam so soon after surgery?

## 2014-09-24 NOTE — Telephone Encounter (Signed)
Spoke with patient. Advised of message as seen below from [place]. Patient is agreeable. Appointment moved to 3/27 at 3:30pm with Dr.Silva. Patient is agreeable to date and time.  Routing to provider for final review. Patient agreeable to disposition. Will close encounter

## 2014-09-24 NOTE — Telephone Encounter (Signed)
Spoke with patient. Patient states that she is having a mastopexy on 3/29. Patient has aex scheduled for 11/06/2014 and would like to know if this needs to be moved. Advised patient aex will need to be moved to allow enough time for recovery. Advised will speak with Dr.Silva in regards to how soon after she would like to see the patient for annual. Patient is agreeable and verbalizes understanding.

## 2014-09-26 NOTE — Telephone Encounter (Signed)
Please see Mychart message from patient.  

## 2014-09-29 MED ORDER — TRAZODONE HCL 50 MG PO TABS: 50.0000 mg | ORAL_TABLET | Freq: Every day | ORAL | Status: DC

## 2014-09-29 NOTE — Addendum Note (Signed)
Addended by: Ria Bush on: 09/29/2014 07:47 AM   Modules accepted: Orders, Medications

## 2014-10-03 ENCOUNTER — Encounter: Payer: Self-pay | Admitting: *Deleted

## 2014-10-03 ENCOUNTER — Other Ambulatory Visit (INDEPENDENT_AMBULATORY_CARE_PROVIDER_SITE_OTHER): Payer: Medicare Other

## 2014-10-03 ENCOUNTER — Telehealth: Payer: Self-pay | Admitting: Gastroenterology

## 2014-10-03 ENCOUNTER — Ambulatory Visit (INDEPENDENT_AMBULATORY_CARE_PROVIDER_SITE_OTHER): Payer: Medicare Other | Admitting: Nurse Practitioner

## 2014-10-03 VITALS — BP 120/78 | HR 70 | Ht 65.5 in | Wt 141.8 lb

## 2014-10-03 DIAGNOSIS — R1013 Epigastric pain: Secondary | ICD-10-CM

## 2014-10-03 DIAGNOSIS — R109 Unspecified abdominal pain: Secondary | ICD-10-CM

## 2014-10-03 LAB — CBC WITH DIFFERENTIAL/PLATELET
BASOS ABS: 0 10*3/uL (ref 0.0–0.1)
Basophils Relative: 0.3 % (ref 0.0–3.0)
EOS ABS: 0.1 10*3/uL (ref 0.0–0.7)
Eosinophils Relative: 1.2 % (ref 0.0–5.0)
HEMATOCRIT: 35.3 % — AB (ref 36.0–46.0)
Hemoglobin: 11.9 g/dL — ABNORMAL LOW (ref 12.0–15.0)
LYMPHS ABS: 2.2 10*3/uL (ref 0.7–4.0)
Lymphocytes Relative: 26.5 % (ref 12.0–46.0)
MCHC: 33.6 g/dL (ref 30.0–36.0)
MCV: 85 fl (ref 78.0–100.0)
MONO ABS: 0.5 10*3/uL (ref 0.1–1.0)
MONOS PCT: 6.2 % (ref 3.0–12.0)
NEUTROS ABS: 5.5 10*3/uL (ref 1.4–7.7)
Neutrophils Relative %: 65.8 % (ref 43.0–77.0)
Platelets: 295 10*3/uL (ref 150.0–400.0)
RBC: 4.15 Mil/uL (ref 3.87–5.11)
RDW: 13.7 % (ref 11.5–15.5)
WBC: 8.4 10*3/uL (ref 4.0–10.5)

## 2014-10-03 LAB — HEPATIC FUNCTION PANEL
ALBUMIN: 4.4 g/dL (ref 3.5–5.2)
ALT: 21 U/L (ref 0–35)
AST: 23 U/L (ref 0–37)
Alkaline Phosphatase: 88 U/L (ref 39–117)
Bilirubin, Direct: 0.1 mg/dL (ref 0.0–0.3)
Total Bilirubin: 0.3 mg/dL (ref 0.2–1.2)
Total Protein: 6.6 g/dL (ref 6.0–8.3)

## 2014-10-03 LAB — LIPASE: Lipase: 21 U/L (ref 11.0–59.0)

## 2014-10-03 NOTE — Patient Instructions (Addendum)
Your physician has requested that you go to the basement for the following lab work before leaving today: CBC, Lipase, Hepatic panel  You have been scheduled for an abdominal ultrasound at [facility] (1st floor of hospital) on Tuesday 10/07/14 at 7:30 am. Please arrive 15 minutes prior to your appointment for registration. Make certain not to have anything to eat or drink 6 hours prior to your appointment. Should you need to reschedule your appointment, please contact radiology at [phone]. This test typically takes about 30 minutes to perform.

## 2014-10-03 NOTE — Progress Notes (Signed)
     History of Present Illness:  Patient is a 59 year old female known to Dr. Fuller Plan. She has a history of severe fibromyalgia, chronic narcotic use, and interstitial cystitis. Patient also has a history of recurrent choledocholithiasis and is status post at least 2 ERCPs with sphincterotomy and removal of stones (last one Feb 2015)  Patient comes in today with a 5 week history of epigastric pain. Pain radiates through to the back and episodes getting progressively worse over the last several weeks. This is the same pain as when patient had common bile duct stones. She reports some chills but no documented fevers. Patient feels certain she has recurrent common bile duct stones.  Current Medications, Allergies, Past Medical History, Past Surgical History, Family History and Social History were reviewed in [employer] record.  Physical Exam: General: Pleasant, well developed , white female in no acute distress Head: Normocephalic and atraumatic Eyes:  sclerae anicteric, conjunctiva pink  Ears: Normal auditory acuity Lungs: Clear throughout to auscultation Heart: Regular rate and rhythm Abdomen: Soft, non distended, non-tender. No masses, no hepatomegaly. Normal bowel sounds Musculoskeletal: Symmetrical with no gross deformities  Extremities: No edema  Neurological: Alert oriented x 4, grossly nonfocal Psychological:  Alert and cooperative. Normal mood and affect  Assessment and Recommendations:    59 year old female with history of recurrent choledocholithiasis, status post at least 2 ERCPs with stone removal and sphincterotomy (last one February 2015). Now with 5 week history of recurrent epigastric pain radiating through to her back with associated ausea and vomiting. Patient feels certain she has a recurrent common bile duct stone. Unfortunately it is late Friday afternoon, we are unable to get an ultrasound. Will check labs including CBC, LFTs and lipase. Will  arrange for ultrasound to be done early next week. Patient knows to call us over the weekend, or go to the emergency department for worsening pain, fever/chills. She declines pain medications, already takes something for her fibromyalgia.

## 2014-10-03 NOTE — Telephone Encounter (Signed)
Patient with a history of choledocholithiasis.  She has had 2 weeks of worsening abdominal pain consistent with prior stones.  She states that last night had nausea, vomiting, and terrible abdominal pain.  She reports she does feel better this am, but requiring antacids for relief of pain.  She will come in and see Tye Savoy RNP today at 2:30

## 2014-10-05 NOTE — Progress Notes (Signed)
Reviewed and agree with management plan.  Isac Lincks T. Rubi Tooley, MD FACG 

## 2014-10-06 ENCOUNTER — Encounter: Payer: Self-pay | Admitting: Family Medicine

## 2014-10-07 ENCOUNTER — Ambulatory Visit (HOSPITAL_COMMUNITY)
Admission: RE | Admit: 2014-10-07 | Discharge: 2014-10-07 | Disposition: A | Discharge status: Home / Self Care | Payer: Medicare Other | Source: Ambulatory Visit | Attending: Nurse Practitioner | Admitting: Nurse Practitioner

## 2014-10-07 DIAGNOSIS — R110 Nausea: Secondary | ICD-10-CM | POA: Insufficient documentation

## 2014-10-07 DIAGNOSIS — Z9049 Acquired absence of other specified parts of digestive tract: Secondary | ICD-10-CM | POA: Diagnosis not present

## 2014-10-07 DIAGNOSIS — R109 Unspecified abdominal pain: Secondary | ICD-10-CM | POA: Diagnosis present

## 2014-10-07 DIAGNOSIS — K838 Other specified diseases of biliary tract: Secondary | ICD-10-CM | POA: Insufficient documentation

## 2014-10-08 NOTE — Telephone Encounter (Signed)
Can we resubmit PA for silenor? Pt has tried and failed belsomra in the past. thanks

## 2014-10-16 ENCOUNTER — Other Ambulatory Visit: Payer: Self-pay

## 2014-10-16 ENCOUNTER — Ambulatory Visit
Admission: RE | Admit: 2014-10-16 | Discharge: 2014-10-16 | Disposition: A | Discharge status: Home / Self Care | Payer: Medicare Other | Source: Ambulatory Visit

## 2014-10-16 DIAGNOSIS — Z1231 Encounter for screening mammogram for malignant neoplasm of breast: Secondary | ICD-10-CM

## 2014-10-20 ENCOUNTER — Other Ambulatory Visit: Payer: Self-pay | Admitting: Gastroenterology

## 2014-10-24 HISTORY — PX: BREAST IMPLANT EXCHANGE: SHX6296

## 2014-11-02 ENCOUNTER — Encounter: Payer: Self-pay | Admitting: Family Medicine

## 2014-11-03 MED ORDER — ALPRAZOLAM 1 MG PO TABS: 1.0000 mg | ORAL_TABLET | Freq: Three times a day (TID) | ORAL | Status: DC | PRN

## 2014-11-03 MED ORDER — ESZOPICLONE 2 MG PO TABS: 2.0000 mg | ORAL_TABLET | Freq: Every evening | ORAL | Status: DC | PRN

## 2014-11-03 NOTE — Telephone Encounter (Signed)
See pt mychart request.  Printed meds and in Kim's box. plz verify she's on lunesta 2mg  (comes in 1,2,3 mg)

## 2014-11-05 ENCOUNTER — Telehealth: Payer: Self-pay | Admitting: Family Medicine

## 2014-11-05 MED ORDER — ESZOPICLONE 2 MG PO TABS: 3.0000 mg | ORAL_TABLET | Freq: Every evening | ORAL | Status: DC | PRN

## 2014-11-05 MED ORDER — ESZOPICLONE 3 MG PO TABS: 3.0000 mg | ORAL_TABLET | Freq: Every evening | ORAL | Status: DC | PRN

## 2014-11-05 NOTE — Telephone Encounter (Signed)
Printed 3mg  script. plz have her start this when done with 2mg  dose or just start 3mg  dose when it arrives instead of 2mg  dose

## 2014-11-05 NOTE — Telephone Encounter (Signed)
Pt called regarding generic lunesta.  (eszopiclone) Please update the dosage to 3 mg.  Pt prefers to take 1 tablet instead of 2, and the pt states the medication is very expensive to purchase separately.  She also states that you can call the pharmacy and update the dosage information with them. Pt's phone number is her cell, [phone]. Pharmacy's number is listed below.  [phone].

## 2014-11-05 NOTE — Telephone Encounter (Signed)
Late entry-Rx faxed to [facility] 11/03/14

## 2014-11-05 NOTE — Telephone Encounter (Signed)
Was able to get Rx changed to 3mg . Patient notified.

## 2014-11-05 NOTE — Telephone Encounter (Signed)
Will need Rx for 1mg  tablet printed to go along with 2mg  tablet that was sent in on 11/03/14. Thanks!

## 2014-11-06 ENCOUNTER — Ambulatory Visit: Payer: Medicare Other | Admitting: Obstetrics and Gynecology

## 2014-11-13 ENCOUNTER — Encounter: Payer: Self-pay | Admitting: Family Medicine

## 2014-11-20 ENCOUNTER — Encounter: Payer: Self-pay | Admitting: Family Medicine

## 2014-12-07 ENCOUNTER — Encounter: Payer: Self-pay | Admitting: Family Medicine

## 2014-12-10 MED ORDER — MORPHINE-NALTREXONE 80-3.2 MG PO CPCR: 1.0000 | ORAL_CAPSULE | Freq: Every day | ORAL | Status: DC

## 2014-12-10 MED ORDER — BACLOFEN 10 MG PO TABS: 10.0000 mg | ORAL_TABLET | Freq: Three times a day (TID) | ORAL | Status: DC | PRN

## 2014-12-10 MED ORDER — AMPHETAMINE-DEXTROAMPHET ER 30 MG PO CP24: 30.0000 mg | ORAL_CAPSULE | ORAL | Status: DC

## 2014-12-10 NOTE — Telephone Encounter (Signed)
Sent pt email notifying her the Rx are ready for pick up and placed in the front office

## 2014-12-10 NOTE — Telephone Encounter (Signed)
Printed and in Kim's box baclofen sent to [facility]

## 2014-12-12 ENCOUNTER — Encounter: Payer: Self-pay | Admitting: Family Medicine

## 2014-12-12 ENCOUNTER — Ambulatory Visit (INDEPENDENT_AMBULATORY_CARE_PROVIDER_SITE_OTHER): Payer: Medicare Other | Admitting: Family Medicine

## 2014-12-12 ENCOUNTER — Ambulatory Visit (INDEPENDENT_AMBULATORY_CARE_PROVIDER_SITE_OTHER)
Admission: RE | Admit: 2014-12-12 | Discharge: 2014-12-12 | Disposition: A | Discharge status: Home / Self Care | Payer: Medicare Other | Source: Ambulatory Visit | Attending: Family Medicine | Admitting: Family Medicine

## 2014-12-12 VITALS — BP 110/62 | HR 79 | Temp 98.0°F | Wt 136.5 lb

## 2014-12-12 DIAGNOSIS — G894 Chronic pain syndrome: Secondary | ICD-10-CM | POA: Diagnosis not present

## 2014-12-12 DIAGNOSIS — R7303 Prediabetes: Secondary | ICD-10-CM

## 2014-12-12 DIAGNOSIS — M898X8 Other specified disorders of bone, other site: Secondary | ICD-10-CM | POA: Diagnosis not present

## 2014-12-12 DIAGNOSIS — M159 Polyosteoarthritis, unspecified: Secondary | ICD-10-CM

## 2014-12-12 DIAGNOSIS — M89319 Hypertrophy of bone, unspecified shoulder: Secondary | ICD-10-CM

## 2014-12-12 DIAGNOSIS — K219 Gastro-esophageal reflux disease without esophagitis: Secondary | ICD-10-CM | POA: Diagnosis not present

## 2014-12-12 DIAGNOSIS — G4700 Insomnia, unspecified: Secondary | ICD-10-CM | POA: Diagnosis not present

## 2014-12-12 DIAGNOSIS — R7309 Other abnormal glucose: Secondary | ICD-10-CM

## 2014-12-12 DIAGNOSIS — M150 Primary generalized (osteo)arthritis: Secondary | ICD-10-CM

## 2014-12-12 MED ORDER — CELECOXIB 100 MG PO CAPS: 100.0000 mg | ORAL_CAPSULE | Freq: Every day | ORAL | Status: DC

## 2014-12-12 NOTE — Patient Instructions (Addendum)
Try zantac at night time 150mg  as needed for when heartburn gets out of control.  Remind [place] to check hep C next labwork.  Try celebrex for pain as needed. We will fill prior authorization if needed. Coupon provided today. R clavicle xray today.

## 2014-12-12 NOTE — Progress Notes (Signed)
BP 110/62 mmHg  Pulse 79  Temp(Src) 98 F (36.7 C) (Other (Comment))  Wt 136 lb 8 oz (61.916 kg)  SpO2 95%  LMP 07/25/1998   CC: 3-4 mo f/u visit  Subjective:    Patient ID: Rachel Duran, female    DOB: [birth year], 59 y.o.   MRN: [id number]  HPI: Rachel Duran is a 59 y.o. 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  Last visit we prescribed prednisone taper. She subsequently saw Dr Bronson Curb who did bilateral hip bursitis cortisone injections. She did very well for 3 months, but now chronic arthralgias are recurring over last 3 weeks. Feels FM and oA flare has returned.  GERD - sxs come and go. Takes actigall and nexium 40mg  BID. Tends to happen at night time. Asks about dementia link to PPI. She does have severe GERD and she has fmhx esophagitis.  Remote R clavicle dislocation at age 47yo, never corrected. Over last month notices head of clavicle enlarging and somewhat tender. No redness or warmth.  Relevant past medical, surgical, family and social history reviewed and updated as indicated. Interim medical history since our last visit reviewed. Allergies and medications reviewed and updated. Current Outpatient Prescriptions on File Prior to Visit  Medication Sig  . acyclovir (ZOVIRAX) 800 MG tablet Take one-half tablet by  mouth every day  . ALPRAZolam (XANAX) 1 MG tablet Take 1 tablet (1 mg total) by mouth 3 (three) times daily as needed for anxiety. With 1/2 tab at night time  . amLODipine (NORVASC) 5 MG tablet Take 5 mg by mouth every morning. For [name]  . amphetamine-dextroamphetamine (ADDERALL XR) 30 MG 24 hr capsule Take 1 capsule  (30 mg total) by mouth every morning.  . ARIPiprazole (ABILIFY) 10 MG tablet Take 5 mg by mouth at bedtime.   Marland Kitchen aspirin EC 81 MG tablet Take 81 mg by mouth every other day.   . baclofen (LIORESAL) 10 MG tablet Take 1 tablet (10 mg total) by mouth 3 (three) times daily as needed.  . Calcium-Magnesium-Vitamin D [id number] MG-MG-UNIT TB24 Take 1 tablet by mouth 2 (two) times daily.  Marland Kitchen co-enzyme Q-10 50 MG capsule Take 50 mg by mouth daily.  . cyanocobalamin (,VITAMIN B-12,) 1000 MCG/ML injection Inject 1,000 mcg into the muscle every 21 ( twenty-one) days.  . diclofenac sodium (VOLTAREN) 1 % GEL Apply 1 g topically 2 (two) times daily as needed (pain).   Marland Kitchen esomeprazole (NEXIUM) 40 MG capsule TAKE ONE CAPSULE BY MOUTH TWICE DAILY   . Estradiol 10 MCG TABS vaginal tablet Place 1 tablet vaginally every Tuesday, Thursday, and Saturday at 6 PM. vagifem  . eszopiclone 3 MG TABS Take 1 tablet (3 mg total) by mouth at bedtime as needed for sleep. Take immediately before bedtime  . hydrocortisone (ANUSOL-HC) 25 MG suppository Place 25 mg rectally 2 (two) times daily as needed for hemorrhoids or itching.  . lamoTRIgine (LAMICTAL) 150 MG tablet Take 150 mg by mouth daily.   . Linaclotide (LINZESS) 290 MCG CAPS capsule Take 1 capsule (290 mcg total) by mouth daily.  Marland Kitchen MAGNESIUM PO Take 1,000 mg by mouth 3 (three) times daily.  . Manganese 50 MG TABS Take 50 mg by mouth  daily.   . morphine (MSIR) 15 MG tablet Take 1 tablet (15 mg total) by mouth every 4 (four) hours as needed for severe pain.  Marland Kitchen Morphine-Naltrexone 80-3.2 MG CPCR Take 1 tablet by mouth daily.  . ondansetron (ZOFRAN) 4 MG tablet Take 4 mg by mouth every 8 (eight) hours as needed for nausea or vomiting.  . pentosan polysulfate (ELMIRON) 100 MG capsule Take 200 mg by mouth 2 (two) times daily.   . Polyethylene Glycol 3350 (MIRALAX PO) Take 17 g by mouth 4 (four) times daily.   . pregabalin (LYRICA) 150 MG capsule Take 150 mg by mouth 3 (three)  times daily.   Marland Kitchen thiamine 100 MG tablet Take 100 mg by mouth daily.  . Triamcinolone Acetonide (NASACORT AQ NA) Place 2 sprays into the nose daily as needed (allergies).   . ursodiol (ACTIGALL) 300 MG capsule TAKE ONE CAPSULE BY MOUTH TWICE DAILY   . [DISCONTINUED] CVS SLOW RELEASE IRON 143 (45 FE) MG TBCR TAKE 1 TABLET EVERY DAY   No current facility-administered medications on file prior to visit.    Review of Systems Per HPI unless specifically indicated above     Objective:    BP 110/62 mmHg  Pulse 79  Temp(Src) 98 F (36.7 C) (Other (Comment))  Wt 136 lb 8 oz (61.916 kg)  SpO2 95%  LMP 07/25/1998  Wt Readings from Last 3 Encounters:  12/12/14 136 lb 8 oz (61.916 kg)  10/03/14 141 lb 12.8 oz (64.32 kg)  09/11/14 144 lb 4 oz (65.431 kg)    Physical Exam  Constitutional: She appears well-developed and well-nourished. No distress.  HENT:  Mouth/Throat: Oropharynx is clear and moist. No oropharyngeal exudate.  Neck: Normal range of motion. Neck supple.  Cardiovascular: Normal rate, regular rhythm, normal heart sounds and intact distal pulses.   No murmur heard. Pulmonary/Chest: Effort normal and breath sounds normal. No respiratory distress. She has no wheezes. She has no rales. She exhibits tenderness.  Enlargement of R clavicle noted without erythema or warmth, mildly tender to palpation  Musculoskeletal: She exhibits no edema.  Skin: Skin is warm and dry. No rash noted.  Psychiatric: She has a normal mood and affect.  Nursing note and vitals reviewed.  Results for orders placed or performed in visit on 10/03/14  CBC with Differential/Platelet  Result Value Ref Range   WBC 8.4 4.0 - 10.5 K/uL   RBC 4.15 3.87 - 5.11 Mil/uL   Hemoglobin 11.9 (L) 12.0 - 15.0 g/dL   HCT 35.3 (L) 36.0 - 46.0 %   MCV 85.0 78.0 - 100.0 fl   MCHC 33.6 30.0 - 36.0 g/dL   RDW 13.7 11.5 - 15.5 %   Platelets 295.0 150.0 - 400.0 K/uL   Neutrophils Relative % 65.8 43.0 - 77.0 %   Lymphocytes  Relative 26.5 12.0 - 46.0 %   Monocytes Relative 6.2 3.0 - 12.0 %   Eosinophils Relative 1.2 0.0 - 5.0 %   Basophils Relative 0.3 0.0 - 3.0 %   Neutro Abs 5.5 1.4 - 7.7 K/uL   Lymphs Abs 2.2 0.7 - 4.0 K/uL   Monocytes Absolute 0.5 0.1 - 1.0 K/uL   Eosinophils Absolute 0.1 0.0 - 0.7 K/uL   Basophils Absolute 0.0 0.0 - 0.1 K/uL  Lipase  Result Value Ref Range   Lipase 21.0 11.0 - 59.0 U/L  Hepatic function panel  Result Value Ref Range   Total Bilirubin 0.3 0.2 - 1.2 mg/dL   Bilirubin, Direct 0.1 0.0 -  0.3 mg/dL   Alkaline Phosphatase 88 39 - 117 U/L   AST 23 0 - 37 U/L   ALT 21 0 - 35 U/L   Total Protein 6.6 6.0 - 8.3 g/dL   Albumin 4.4 3.5 - 5.2 g/dL   Lab Results  Component Value Date   HGBA1C 5.8 06/03/2014      Assessment & Plan:  Will need hep C testing next labwork. Problem List Items Addressed This Visit    Chronic pain syndrome    See below. Continue embeda, MSIR.      Clavicle enlargement - Primary    Remote R clavicle dislocation, head enlarging in size over last 1 month with increased discomfort. Start evaluation with plain films today.       Relevant Orders   DG Clavicle Right (Completed)   GERD    Discussed PPI concerns - rec continued PPI. Endorses breakthrough sxs despite nexium 40mg  BID. Will add zantac 150mg  nightly prn. Pt agrees      Relevant Medications   ranitidine (ZANTAC) 150 MG tablet   INSOMNIA, CHRONIC    Longstanding issue. Currently on xanax + lunesta 3mg . continue      Osteoarthritis    Followed by rheum. Remains in chronic pain from this and other chronic issues.  Discussed options including NSAIDS (including COX2 selective). Pt has h/o severe GERD and fmhx CAD. She is currently on vaginal estrogen 3x/wk. Discussed relative risks vs benefits of COX2 selective and nonselective NSAIDs.  Pt willing to try COX2 selective NSAID - will send in celebrex 100mg  to take once daily prn, coupon provided today. Aware of GI and cardiovascular  risks of med. Continue narcotics (embeda + MSIR), baclofen, lyrica. Lab Results  Component Value Date   CREATININE 0.7 06/03/2014        Relevant Medications   celecoxib (CELEBREX) 100 MG capsule   Prediabetes    Discussed dx - pt discussed abilify use with psychiatrist - will continue to monitor glycemic control for now.          Follow up plan: Return in about 4 months (around 04/14/2015), or as needed, for follow up visit.

## 2014-12-12 NOTE — Progress Notes (Signed)
Pre visit review using our clinic review tool, if applicable. No additional management support is needed unless otherwise documented below in the visit note. 

## 2014-12-13 ENCOUNTER — Encounter: Payer: Self-pay | Admitting: Family Medicine

## 2014-12-13 DIAGNOSIS — R7303 Prediabetes: Secondary | ICD-10-CM | POA: Insufficient documentation

## 2014-12-13 DIAGNOSIS — M89319 Hypertrophy of bone, unspecified shoulder: Secondary | ICD-10-CM | POA: Insufficient documentation

## 2014-12-13 NOTE — Assessment & Plan Note (Signed)
See below. Continue embeda, MSIR.

## 2014-12-13 NOTE — Assessment & Plan Note (Signed)
Discussed PPI concerns - rec continued PPI. Endorses breakthrough sxs despite nexium 40mg  BID. Will add zantac 150mg  nightly prn. Pt agrees

## 2014-12-13 NOTE — Assessment & Plan Note (Signed)
Remote R clavicle dislocation, head enlarging in size over last 1 month with increased discomfort. Start evaluation with plain films today.

## 2014-12-13 NOTE — Assessment & Plan Note (Signed)
Discussed dx - pt discussed abilify use with psychiatrist - will continue to monitor glycemic control for now.

## 2014-12-13 NOTE — Assessment & Plan Note (Signed)
Followed by rheum. Remains in chronic pain from this and other chronic issues.  Discussed options including NSAIDS (including COX2 selective). Pt has h/o severe GERD and fmhx CAD. She is currently on vaginal estrogen 3x/wk. Discussed relative risks vs benefits of COX2 selective and nonselective NSAIDs.  Pt willing to try COX2 selective NSAID - will send in celebrex 100mg  to take once daily prn, coupon provided today. Aware of GI and cardiovascular risks of med. Continue narcotics (embeda + MSIR), baclofen, lyrica. Lab Results  Component Value Date   CREATININE 0.7 06/03/2014

## 2014-12-13 NOTE — Assessment & Plan Note (Signed)
Longstanding issue. Currently on xanax + lunesta 3mg . continue

## 2014-12-18 ENCOUNTER — Encounter: Payer: Self-pay | Admitting: Family Medicine

## 2014-12-19 ENCOUNTER — Ambulatory Visit (INDEPENDENT_AMBULATORY_CARE_PROVIDER_SITE_OTHER): Payer: Medicare Other | Admitting: Obstetrics and Gynecology

## 2014-12-19 ENCOUNTER — Encounter: Payer: Self-pay | Admitting: Obstetrics and Gynecology

## 2014-12-19 VITALS — BP 150/90 | HR 80 | Resp 16 | Ht 64.5 in | Wt 136.0 lb

## 2014-12-19 DIAGNOSIS — R6882 Decreased libido: Secondary | ICD-10-CM

## 2014-12-19 DIAGNOSIS — Z01419 Encounter for gynecological examination (general) (routine) without abnormal findings: Secondary | ICD-10-CM | POA: Diagnosis not present

## 2014-12-19 MED ORDER — ESTRADIOL 10 MCG VA TABS: 1.0000 | ORAL_TABLET | VAGINAL | Status: DC

## 2014-12-19 MED ORDER — NONFORMULARY OR COMPOUNDED ITEM: Status: DC

## 2014-12-19 NOTE — Patient Instructions (Signed)

## 2014-12-19 NOTE — Progress Notes (Signed)
Patient ID: Rachel Duran, female   DOB: 1955/12/16, 59 y.o.   MRN: [id number] 60 y.o. G24P2002 [name] here for annual exam.    Some vaginal dryness. Using Vagifem three times weekly.   Had flare of fibromyalgia this winter.  Treated with steroids.   Had mastopexy in March 2016.  Not happy with result. May have a revision in September.  Decreased arousal.  Used testosterone in the past and had increased acne.   Now taking morphine naltrexone. No other changes in medications.   PCP: Dr. Ria Bush  Patient's last menstrual period was 07/25/1998.          Sexually active: Yes.    The current method of family planning is post menopausal status.    Exercising: Yes.     Walking, yoga, stretching. Smoker:  no  Health Maintenance: Pap:  2013 WNL NEG HR HPV  History of abnormal Pap:  no MMG:  10-16-14 heterogeneously dense WNL [facility] Colonoscopy: 11-20-2006 polyps repeat in 2018 Dr. Fuller Plan BMD:   10-14-13 revealed osteopenia left hip:[facility]. T score -1.5. No FRAX model due to estrogen therapy.  TDaP:  2007 Screening Labs:  Hb today: PCP, Urine today: PCP   reports that she has never smoked. She has never used smokeless tobacco. She reports that she does not drink alcohol or use illicit drugs.  Past Medical History  Diagnosis Date  . Disorders of porphyrin metabolism   . Irritable bowel syndrome   . Fibromyalgia   . Osteoarthritis   . GERD (gastroesophageal reflux disease)   . Allergy   . Chronic pain   . Cholecystitis   . Internal hemorrhoids   . Interstitial cystitis 06-06-12    hx.  . Raynauds disease     hx.  . 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 C6- C7 -hx. past fusion(bone graft used)  . Anemia   . PONV (postoperative nausea and vomiting)     now uses stomach blockers and no ponv  . Abdominal pain last 4 months    and nausea also  . Depression     bipolar depression    Past Surgical History   Procedure Laterality Date  . Cervical fusion    . Cervical lamination    . Cystoscopy    . Nasal sinus surgery    . Gyn surgery    . Hysteroscopy with ablation    . Breast enhancement surgery  2010  . Nasal sinus surgery      x5  . Ercp  05/22/2012    Procedure: ENDOSCOPIC RETROGRADE CHOLANGIOPANCREATOGRAPHY (ERCP);  Surgeon: [name], MD,FACG;  Location: [facility];  Service: Endoscopy;  Laterality: N/A;  . Cesarean section      x 1  . Hernia repair      inguinal  . Cholecystectomy  06/11/2012    Procedure: LAPAROSCOPIC CHOLECYSTECTOMY WITH INTRAOPERATIVE CHOLANGIOGRAM;  Surgeon: [name], MD,FACS;  Location: [facility];  Service: General;  Laterality: N/A;  . Bunionectomy Bilateral yrs ago  . Ercp N/A 09/17/2013    Procedure: ENDOSCOPIC RETROGRADE CHOLANGIOPANCREATOGRAPHY (ERCP);  Surgeon: [name], MD;  Location: [facility];  Service: Endoscopy;  Laterality: N/A;  . Hemorrhoid banding  09-23-13    --Dr. Greer Pickerel  . Breast implant exchange  10/2014    exchange saline implants, B mastopexy/capsulorraphy ([facility])    Current Outpatient Prescriptions  Medication Sig Dispense Refill  . acyclovir (ZOVIRAX) 800 MG  tablet Take one-half tablet by  mouth every day 45 tablet 1  . ALPRAZolam (XANAX) 1 MG tablet Take 1 tablet (1 mg total) by mouth 3 (three) times daily as needed for anxiety. With 1/2 tab at night time 60 tablet 0  . amLODipine (NORVASC) 5 MG tablet Take 5 mg by mouth every morning. For [name]    . amphetamine-dextroamphetamine (ADDERALL XR) 30 MG 24 hr capsule Take 1 capsule (30 mg total) by mouth every morning. 90 capsule 0  . ARIPiprazole (ABILIFY) 10 MG tablet Take 5 mg by mouth at bedtime.     Marland Kitchen aspirin EC 81 MG tablet Take 81 mg by mouth every other day.     . baclofen (LIORESAL) 10 MG tablet Take 1 tablet (10 mg total) by mouth 3 (three) times daily as needed. 270 tablet 1  . Calcium-Magnesium-Vitamin D 600-40-500 MG-MG-UNIT TB24 Take 1  tablet by mouth 2 (two) times daily.    . celecoxib (CELEBREX) 100 MG capsule Take 1 capsule (100 mg total) by mouth daily. 30 capsule 3  . co-enzyme Q-10 50 MG capsule Take 50 mg by mouth daily.    . cyanocobalamin (,VITAMIN B-12,) 1000 MCG/ML injection Inject 1,000 mcg into the muscle every 21 ( twenty-one) days.    . diclofenac sodium (VOLTAREN) 1 % GEL Apply 1 g topically 2 (two) times daily as needed (pain).     Marland Kitchen esomeprazole (NEXIUM) 40 MG capsule TAKE ONE CAPSULE BY MOUTH TWICE DAILY  60 capsule 5  . Estradiol 10 MCG TABS vaginal tablet Place 1 tablet vaginally every Tuesday, Thursday, and Saturday at 6 PM. vagifem    . eszopiclone 3 MG TABS Take 1 tablet (3 mg total) by mouth at bedtime as needed for sleep. Take immediately before bedtime 30 tablet 3  . hydrocortisone (ANUSOL-HC) 25 MG suppository Place 25 mg rectally 2 (two) times daily as needed for hemorrhoids or itching.    . lamoTRIgine (LAMICTAL) 150 MG tablet Take 150 mg by mouth daily.     . Linaclotide (LINZESS) 290 MCG CAPS capsule Take 1 capsule (290 mcg total) by mouth daily. 30 capsule 6  . MAGNESIUM PO Take 1,000 mg by mouth 3 (three) times daily.    . Manganese 50 MG TABS Take 50 mg by mouth daily.     Marland Kitchen morphine (MSIR) 15 MG tablet Take 1 tablet (15 mg total) by mouth every 4 (four) hours as needed for severe pain. 30 tablet 0  . Morphine-Naltrexone 80-3.2 MG CPCR Take 1 tablet by mouth daily. 90 capsule 0  . ondansetron (ZOFRAN) 4 MG tablet Take 4 mg by mouth every 8 (eight) hours as needed for nausea or vomiting.    . pentosan polysulfate (ELMIRON) 100 MG capsule Take 200 mg by mouth 2 (two) times daily.     . Polyethylene Glycol 3350 (MIRALAX PO) Take 17 g by mouth 4 (four) times daily.     . pregabalin (LYRICA) 150 MG capsule Take 150 mg by mouth 3 (three) times daily.     . ranitidine (ZANTAC) 150 MG tablet Take 150 mg by mouth at bedtime as needed for heartburn.    . thiamine 100 MG tablet Take 100 mg by mouth  daily.    . Triamcinolone Acetonide (NASACORT AQ NA) Place 2 sprays into the nose daily as needed (allergies).     . ursodiol (ACTIGALL) 300 MG capsule TAKE ONE CAPSULE BY MOUTH TWICE DAILY  60 capsule 2  . [DISCONTINUED] [facility] SLOW RELEASE  IRON 143 (45 FE) MG TBCR TAKE 1 TABLET EVERY DAY 30 tablet 0   No current facility-administered medications for this visit.    Family History  Problem Relation Age of Onset  . CAD Father 27    MI, nonsmoker  . Esophageal cancer Father 54  . Scleroderma Mother   . Hypertension Mother   . Esophageal cancer Paternal Grandfather   . Diabetes Maternal Grandmother   . Stroke Neg Hx   . Stomach cancer Father   . Stomach cancer Paternal Grandfather     ROS:  Pertinent items are noted in HPI.  Otherwise, a comprehensive ROS was negative.  Exam:   LMP 07/25/1998    General appearance: alert, cooperative and appears stated age Head: Normocephalic, without obvious abnormality, atraumatic Neck: no adenopathy, supple, symmetrical, trachea midline and thyroid normal to inspection and palpation Lungs: clear to auscultation bilaterally Breasts: No nipple retraction or dimpling, No nipple discharge or bleeding, No axillary or supraclavicular adenopathy, Bilateral implants and scar consistent with surgery.  3 mm excoriation of the left breast along scar line.  Heart: regular rate and rhythm Abdomen: soft, non-tender; bowel sounds normal; no masses,  no organomegaly Extremities: extremities normal, atraumatic, no cyanosis or edema Skin: Skin color, texture, turgor normal. No rashes or lesions Lymph nodes: Cervical, supraclavicular, and axillary nodes normal. No abnormal inguinal nodes palpated Neurologic: Grossly normal  Pelvic: External genitalia:  no lesions              Urethra:  normal appearing urethra with no masses, tenderness or lesions              Bartholins and Skenes: normal                 Vagina: normal appearing vagina with normal color and  discharge, no lesions              Cervix: No nipple retraction or dimpling, No nipple discharge or bleeding, No axillary or supraclavicular adenopathy, no dominant masses.  Has bilateral implants and scars from surgery.  Small excoriation along scar of the left breast.              Pap taken: Yes.   Bimanual Exam:  Uterus:  normal size, contour, position, consistency, mobility, non-tender              Adnexa: normal adnexa and no mass, fullness, tenderness              Rectovaginal: Yes.  .  Confirms.              Anus:  normal sphincter tone, no lesions  Chaperone was present for exam.  Assessment:   Well woman visit with normal exam. Bilateral breast implants and recent mastopexy.  Atrophic vaginal changes.  Osteopenia.   Plan: Yearly mammogram recommended after age 21.  Recommended self breast exam.  Pap and HR HPV as above. Discussed Calcium, Vitamin D, regular exercise program including cardiovascular and weight bearing exercise. Labs performed.  Yes.  .   See orders.  Testosterone.  Refills given on medications.  Yes.  .  See orders.  Compounded testosterone and Vagifem.  Discussed risks of medication including stroke, PE, DVT, MI, breast cancer, hair growth, voice deepening.   Wishes to proceed.   Will wait for testosterone level before starting medication.  Will return for a recheck in 6 weeks.  Bone density in 2017.  Follow up annually and prn.   After visit summary provided.

## 2014-12-20 LAB — TESTOSTERONE: Testosterone: 13 ng/dL (ref 10–70)

## 2014-12-25 ENCOUNTER — Telehealth: Payer: Self-pay | Admitting: Gastroenterology

## 2014-12-25 LAB — IPS PAP TEST WITH HPV

## 2014-12-25 NOTE — Telephone Encounter (Signed)
Left a message for patient to return my call. 

## 2014-12-26 ENCOUNTER — Encounter: Payer: Self-pay | Admitting: Obstetrics and Gynecology

## 2014-12-26 MED ORDER — URSODIOL 300 MG PO CAPS: 300.0000 mg | ORAL_CAPSULE | Freq: Two times a day (BID) | ORAL | Status: DC

## 2014-12-26 NOTE — Telephone Encounter (Signed)
Informed patient to contact us if we have any recurrent symptoms of abdominal pain. Pt verbalized understanding. Medication already sent to pharmacy.

## 2014-12-26 NOTE — Telephone Encounter (Signed)
Left a message for patient to return my call. 

## 2014-12-26 NOTE — Telephone Encounter (Signed)
OK to cancel mrcp since she is not having pain Refill urso

## 2014-12-26 NOTE — Telephone Encounter (Signed)
Asked patient if she was taking the Ursodiol consistently because we gave her refills back in 05/2014 with two refills after so she should be out of the medicine already. Pt states she does take it every day but she forgets sometimes to take it twice a day. Also, looking at her records patient was going to schedule a MRCP back in March but was also having surgery at that time so she postponed the MRCP. Patient states she is willing to schedule at this time but she has not had any current symptoms of abdominal pain. She had some pain that lasted a couple of hours at at time. This was 6-7 days ago but it resolved. Patient states she is willing to schedule but wants to make sure that this is the plan Dr. Fuller Plan wants at this time or if she needs to be seen again in the office first. Told patient I will send the medication to the pharmacy and also ask Dr. Fuller Plan to review.

## 2014-12-29 ENCOUNTER — Telehealth: Payer: Self-pay | Admitting: Emergency Medicine

## 2014-12-29 NOTE — Telephone Encounter (Signed)
Dr. Quincy Simmonds,  Can you review and advise regarding instructions for use of testosterone cream.  Also, okay to place order for Vagifem for 90 day supply?

## 2014-12-29 NOTE — Telephone Encounter (Signed)
Spoke with patient and message from Dr. Quincy Simmonds given. Patient verbalized understanding of how to place Testosterone.   Patient declines new RX at this time, she states she will call when she would like Vagifem to be sent to mail order [facility]. She advised this may need prior authorization due to using 3 tablets per week.  Patient will need 36 tablets for 90 day supply.  Routing to provider for final review. Patient agreeable to disposition. Will close encounter.

## 2014-12-29 NOTE — Telephone Encounter (Signed)
Chief Complaint  Patient presents with  . Advice Only    Patient sent mychart message, requires clinical triage   ---- Message -----    From: [name]    Sent: 12/26/2014  9:30 AM EDT      To: Arloa Koh, MD Subject: Visit Follow-Up Question  I picked up my testosterone and I want to make sure I am using it correctly. I am to apply it twice a day to my vulva, is that strictly externally or do I place in inside my vulva but not in the vagina. I do have an appt to see you in 6 weeks. Also, I noticed you wrote my RX for Vagifem for a 30 day supply. I should have told you that I use [facility] (mail order pharmacy)for that because it's much cheaper. When I am ready to fill my Vagifem I will call in so you can fax a script for a 90 day supply to them. Have a great weekend. Thanks so very much!

## 2014-12-29 NOTE — Telephone Encounter (Signed)
Testosterone is to be applied externally only near the perineum area.  OK for Vagifem 90 day Rx and 3 refills.

## 2014-12-29 NOTE — Telephone Encounter (Signed)
Telephone call for triage created to discuss message with patient and disposition as appropriate.   

## 2015-01-12 ENCOUNTER — Other Ambulatory Visit: Payer: Self-pay | Admitting: *Deleted

## 2015-01-12 NOTE — Telephone Encounter (Signed)
Fax refill request, paper Rx for 30 day supply was printed on 11/05/14 #30 with 3 additional refills, now mail order is requesting 90 day supply of Rx, please advise

## 2015-01-13 MED ORDER — ESZOPICLONE 3 MG PO TABS: 3.0000 mg | ORAL_TABLET | Freq: Every evening | ORAL | Status: DC | PRN

## 2015-01-13 NOTE — Telephone Encounter (Signed)
Printed and in Kim's box 

## 2015-01-13 NOTE — Telephone Encounter (Signed)
Rx faxed to [facility]

## 2015-01-19 ENCOUNTER — Other Ambulatory Visit: Payer: Self-pay | Admitting: Family Medicine

## 2015-01-26 ENCOUNTER — Encounter: Payer: Self-pay | Admitting: Family Medicine

## 2015-01-29 ENCOUNTER — Ambulatory Visit (INDEPENDENT_AMBULATORY_CARE_PROVIDER_SITE_OTHER): Payer: Medicare Other | Admitting: Obstetrics and Gynecology

## 2015-01-29 ENCOUNTER — Encounter: Payer: Self-pay | Admitting: Obstetrics and Gynecology

## 2015-01-29 VITALS — BP 122/74 | Resp 20 | Ht 64.5 in | Wt 134.0 lb

## 2015-01-29 DIAGNOSIS — R6882 Decreased libido: Secondary | ICD-10-CM | POA: Diagnosis not present

## 2015-01-29 DIAGNOSIS — N644 Mastodynia: Secondary | ICD-10-CM | POA: Diagnosis not present

## 2015-01-29 MED ORDER — ONDANSETRON HCL 4 MG PO TABS: 4.0000 mg | ORAL_TABLET | Freq: Three times a day (TID) | ORAL | Status: DC | PRN

## 2015-01-29 MED ORDER — CYANOCOBALAMIN 1000 MCG/ML IJ SOLN: 1000.0000 ug | INTRAMUSCULAR | Status: DC

## 2015-01-29 NOTE — Patient Instructions (Addendum)
We will call with the testosterone level and then advise you on the frequency of use of the testosterone medication.   Please see your new plastic surgeon if your right breast/chest wall pain persists!

## 2015-01-29 NOTE — Progress Notes (Signed)
GYNECOLOGY  VISIT   HPI: 59 y.o.   Married  Caucasian  female   G2P2002 with Patient's last menstrual period was 07/25/1998.   here for  6 week recheck Using testosterone propionate 2% twice daily.  Increased vaginal lubrication and arousal is better.  No increased hair growth.  Happy with the response to the testosterone.  Still using the vaginal estrogen tablets.  Right breast pain for 2 weeks. Had sudden onset of pain following lifting her grandchild. Saw her plastic surgeon yesterday. Told her implant is intact. Hurts all day long. Hurts to move her arms.  Tender to touch.  No fever or malaise.  Is using adhesives on her nipple region and notes some irritation of the left areola but not the right.  Will be transferring her care to a new Psychiatric nurse.   GYNECOLOGIC HISTORY: Patient's last menstrual period was 07/25/1998. Contraception:  Menopausal hormone therapy: Vagifem  ; Testosterone propionate 2% Last mammogram: 10/16/14 bi-rads 1: neg Last pap smear: 12/19/14 wnl neg hr hpv        OB History    Gravida Para Term Preterm AB TAB SAB Ectopic Multiple Living   2 2 2       2          Patient Active Problem List   Diagnosis Date Noted  . Clavicle enlargement 12/13/2014  . Prediabetes 12/13/2014  . Plantar fasciitis, bilateral 09/11/2014  . Advanced care planning/counseling discussion 06/10/2014  . Medicare annual wellness visit, initial 06/10/2014  . Abnormal thyroid function test 06/10/2014  . Chronic pain syndrome 06/10/2014  . CFS (chronic fatigue syndrome) 04/08/2014  . RUQ abdominal pain 12/09/2013  . Calculus of bile duct without mention of cholecystitis or obstruction 09/17/2013  . Postmenopausal atrophic vaginitis 10/19/2012  . 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 Cervical disc disorder with radiculopathy of cervical region 05/28/2010  . NECK PAIN, CHRONIC 10/01/2009  . Synovitis and tenosynovitis 04/30/2009  . Anemia of chronic disease  03/17/2009  . CHEST PAIN UNSPECIFIED 02/28/2008  . APHTHOUS ULCERS 01/31/2008  . INSOMNIA, CHRONIC 11/09/2007  . Drug-induced constipation 10/11/2007  . ALLERGIC RHINITIS 04/12/2007  . Depression 03/05/2007  . [name] syndrome 03/05/2007  . GERD 03/05/2007  . ROSACEA 03/05/2007  . Osteoarthritis 03/05/2007  . NEURALGIA 03/05/2007  . 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 CYSTITIS, CHRONIC INTERSTITIAL 12/06/2006  . Fibromyalgia 12/06/2006    Past Medical History  Diagnosis Date  . Disorders of porphyrin metabolism   . Irritable bowel syndrome   . Fibromyalgia   . Osteoarthritis   . GERD (gastroesophageal reflux disease)   . Allergy   . Chronic pain   . Cholecystitis   . Internal hemorrhoids   . Interstitial cystitis 06-06-12    hx.  . Raynauds disease     hx.  . 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 C6- C7 -hx. past fusion(bone graft used)  . Anemia   . PONV (postoperative nausea and vomiting)     now uses stomach blockers and no ponv  . Abdominal pain last 4 months    and nausea also  . Depression     bipolar depression    Past Surgical History  Procedure Laterality Date  . Cervical fusion    . Cervical lamination    . Cystoscopy    . Nasal sinus surgery    . Gyn surgery    . Hysteroscopy with ablation    . Breast enhancement surgery  2010  .  Nasal sinus surgery      x5  . Ercp  05/22/2012    Procedure: ENDOSCOPIC RETROGRADE CHOLANGIOPANCREATOGRAPHY (ERCP);  Surgeon: [name], MD,FACG;  Location: [facility];  Service: Endoscopy;  Laterality: N/A;  . Cesarean section      x 1  . Hernia repair      inguinal  . Cholecystectomy  06/11/2012    Procedure: LAPAROSCOPIC CHOLECYSTECTOMY WITH INTRAOPERATIVE CHOLANGIOGRAM;  Surgeon: [name], MD,FACS;  Location: [facility];  Service: General;  Laterality: N/A;  . Bunionectomy Bilateral yrs ago  . Ercp N/A 09/17/2013    Procedure: ENDOSCOPIC RETROGRADE CHOLANGIOPANCREATOGRAPHY (ERCP);   Surgeon: [name], MD;  Location: [facility];  Service: Endoscopy;  Laterality: N/A;  . Hemorrhoid banding  09-23-13    --Dr. Greer Pickerel  . Breast implant exchange  10/2014    exchange saline implants, B mastopexy/capsulorraphy ([facility])    Current Outpatient Prescriptions  Medication Sig Dispense Refill  . acyclovir (ZOVIRAX) 800 MG tablet Take one-half tablet by  mouth every day 45 tablet 1  . ALPRAZolam (XANAX) 1 MG tablet Take 1 tablet (1 mg total) by mouth 3 (three) times daily as needed for anxiety. With 1/2 tab at night time 60 tablet 0  . amLODipine (NORVASC) 5 MG tablet Take 5 mg by mouth every morning. For [name]    . amphetamine-dextroamphetamine (ADDERALL XR) 30 MG 24 hr capsule Take 1 capsule (30 mg total) by mouth every morning. 90 capsule 0  . ARIPiprazole (ABILIFY) 10 MG tablet Take 5 mg by mouth at bedtime.     Marland Kitchen aspirin EC 81 MG tablet Take 81 mg by mouth every other day.     . baclofen (LIORESAL) 10 MG tablet Take 1 tablet (10 mg total) by mouth 3 (three) times daily as needed. 270 tablet 1  . Calcium-Magnesium-Vitamin D 600-40-500 MG-MG-UNIT TB24 Take 1 tablet by mouth 2 (two) times daily.    . celecoxib (CELEBREX) 100 MG capsule Take 1 capsule (100 mg total) by mouth daily. 30 capsule 3  . co-enzyme Q-10 50 MG capsule Take 50 mg by mouth daily.    . cyanocobalamin (,VITAMIN B-12,) 1000 MCG/ML injection Inject 1 mL (1,000 mcg total) into the muscle every 21 ( twenty-one) days. 1 mL 11  . diclofenac sodium (VOLTAREN) 1 % GEL Apply 1 g topically 2 (two) times daily as needed (pain).     Marland Kitchen esomeprazole (NEXIUM) 40 MG capsule TAKE ONE CAPSULE BY MOUTH TWICE DAILY  60 capsule 5  . Estradiol 10 MCG TABS vaginal tablet Place 1 tablet (10 mcg total) vaginally every Tuesday, Thursday, and Saturday at 6 PM. vagifem 12 tablet 11  . Eszopiclone 3 MG TABS Take 1 tablet (3 mg total) by mouth at bedtime as needed. Take immediately before bedtime 90 tablet 0  .  hydrocortisone (ANUSOL-HC) 25 MG suppository Place 25 mg rectally 2 (two) times daily as needed for hemorrhoids or itching.    . lamoTRIgine (LAMICTAL) 150 MG tablet Take 150 mg by mouth daily.     Marland Kitchen LINZESS 290 MCG CAPS capsule TAKE 1 CAPSULE BY MOUTH EVERY DAY 30 capsule 3  . MAGNESIUM PO Take 1,000 mg by mouth 3 (three) times daily.    . Manganese 50 MG TABS Take 50 mg by mouth daily.     Marland Kitchen morphine (MSIR) 15 MG tablet Take 1 tablet (15 mg total) by mouth every 4 (four) hours as needed for severe pain. 30 tablet 0  .  Morphine-Naltrexone 80-3.2 MG CPCR Take 1 tablet by mouth daily. 90 capsule 0  . NONFORMULARY OR COMPOUNDED ITEM Testosterone propionate 2% in white petrolatum, apply bid for 6 weeks and then daily as directed.  60 grams. 60 each 0  . ondansetron (ZOFRAN) 4 MG tablet Take 1 tablet (4 mg total) by mouth every 8 (eight) hours as needed for nausea or vomiting. 20 tablet 1  . pentosan polysulfate (ELMIRON) 100 MG capsule Take 200 mg by mouth 2 (two) times daily.     . Polyethylene Glycol 3350 (MIRALAX PO) Take 17 g by mouth 4 (four) times daily.     . pregabalin (LYRICA) 150 MG capsule Take 150 mg by mouth 3 (three) times daily.     . ranitidine (ZANTAC) 150 MG tablet Take 150 mg by mouth at bedtime as needed for heartburn.    . thiamine 100 MG tablet Take 100 mg by mouth daily.    . Triamcinolone Acetonide (NASACORT AQ NA) Place 2 sprays into the nose daily as needed (allergies).     . ursodiol (ACTIGALL) 300 MG capsule Take 1 capsule (300 mg total) by mouth 2 (two) times daily. 60 capsule 1  . [DISCONTINUED] [facility] SLOW RELEASE IRON 143 (45 FE) MG TBCR TAKE 1 TABLET EVERY DAY 30 tablet 0   No current facility-administered medications for this visit.     ALLERGIES: Codeine phosphate; Cymbalta; Inh; Silenor; Meperidine hcl; and Sulfamethoxazole  Family History  Problem Relation Age of Onset  . CAD Father 66    MI, nonsmoker  . Esophageal cancer Father 80  . Scleroderma Mother    . Hypertension Mother   . Esophageal cancer Paternal Grandfather   . Diabetes Maternal Grandmother   . Stroke Neg Hx   . Stomach cancer Father   . Stomach cancer Paternal Grandfather     History   Social History  . Marital Status: Married    Spouse Name: N/A  . Number of Children: N/A  . Years of Education: N/A   Occupational History  . retired    Social History Main Topics  . Smoking status: Never Smoker   . Smokeless tobacco: Never Used  . Alcohol Use: No  . Drug Use: No  . Sexual Activity:    Partners: Male    Birth Control/ Protection: Post-menopausal     Comment: vasectomy   Other Topics Concern  . 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  PHYSICAL EXAMINATION:    BP 122/74 mmHg  Resp 20  Ht 5' 4.5" (1.638 m)  Wt 134 lb (60.782 kg)  BMI 22.65 kg/m2  LMP 07/25/1998    General appearance: alert, cooperative and appears stated age Breasts: bilateral breasts with scars and implants.  Tenderness of the inferior right breast.  No masses, erythema or axillary adenopathy.  Left breast with erythema around the areola and no dominant masses, tenderness, or axillary adenopathy.     Pelvic: External genitalia:  no lesions.  Some mild erythema of the bilateral vulva.  Urethra:  normal appearing urethra with no masses, tenderness or lesions               Chaperone was present for exam.  ASSESSMENT  Doing well with testosterone therapy.  Right breast pain.  Bilateral breast implants.  Left areolar erythema.   PLAN  Counseled regarding testosterone therapy. I expect that the patient will continue with this treatment  but need to check her level to see what will be appropriate for her. Will check testosterone level.  I recommend patient see her new plastic surgeon if her pain persists for longer than one week. MRI of the breast may be helpful, but I would defer to the plastic surgeon.  Stop using adhesives on the breast as it appears to be causing a skin reaction.  Follow up prn and for annual exam.   An After Visit Summary was printed and given to the patient.  _15_____ minutes face to face time of which over 50% was spent in counseling.

## 2015-01-29 NOTE — Telephone Encounter (Signed)
See My chart message

## 2015-01-30 ENCOUNTER — Other Ambulatory Visit: Payer: Self-pay | Admitting: Obstetrics and Gynecology

## 2015-01-30 DIAGNOSIS — R6882 Decreased libido: Secondary | ICD-10-CM

## 2015-01-30 LAB — TESTOSTERONE: TESTOSTERONE: 165 ng/dL — AB (ref 10–70)

## 2015-02-02 ENCOUNTER — Encounter: Payer: Self-pay | Admitting: Obstetrics and Gynecology

## 2015-02-02 ENCOUNTER — Encounter: Payer: Self-pay | Admitting: Family Medicine

## 2015-02-02 NOTE — Telephone Encounter (Signed)
Routing to Dr.Silva as Juluis Rainier of [employer] received by patient. Please see message above. Patient would like to call to set up 6 week lab recheck appointment.

## 2015-02-03 MED ORDER — CYANOCOBALAMIN 1000 MCG/ML IJ SOLN: 1000.0000 ug | INTRAMUSCULAR | Status: DC

## 2015-02-16 ENCOUNTER — Other Ambulatory Visit: Payer: Self-pay | Admitting: Family Medicine

## 2015-02-23 ENCOUNTER — Other Ambulatory Visit: Payer: Self-pay | Admitting: Gastroenterology

## 2015-03-08 ENCOUNTER — Encounter: Payer: Self-pay | Admitting: Family Medicine

## 2015-03-09 ENCOUNTER — Ambulatory Visit (INDEPENDENT_AMBULATORY_CARE_PROVIDER_SITE_OTHER): Payer: Medicare Other | Admitting: Family Medicine

## 2015-03-09 ENCOUNTER — Encounter: Payer: Self-pay | Admitting: Family Medicine

## 2015-03-09 VITALS — BP 124/82 | HR 88 | Temp 98.4°F | Wt 134.8 lb

## 2015-03-09 DIAGNOSIS — M150 Primary generalized (osteo)arthritis: Secondary | ICD-10-CM | POA: Diagnosis not present

## 2015-03-09 DIAGNOSIS — M898X8 Other specified disorders of bone, other site: Secondary | ICD-10-CM

## 2015-03-09 DIAGNOSIS — M797 Fibromyalgia: Secondary | ICD-10-CM | POA: Diagnosis not present

## 2015-03-09 DIAGNOSIS — R519 Headache, unspecified: Secondary | ICD-10-CM | POA: Insufficient documentation

## 2015-03-09 DIAGNOSIS — R51 Headache: Secondary | ICD-10-CM | POA: Diagnosis not present

## 2015-03-09 DIAGNOSIS — G894 Chronic pain syndrome: Secondary | ICD-10-CM | POA: Diagnosis not present

## 2015-03-09 DIAGNOSIS — M159 Polyosteoarthritis, unspecified: Secondary | ICD-10-CM

## 2015-03-09 DIAGNOSIS — M89319 Hypertrophy of bone, unspecified shoulder: Secondary | ICD-10-CM

## 2015-03-09 MED ORDER — MORPHINE-NALTREXONE 80-3.2 MG PO CPCR: 1.0000 | ORAL_CAPSULE | Freq: Every day | ORAL | Status: DC

## 2015-03-09 MED ORDER — FLUTICASONE PROPIONATE 50 MCG/ACT NA SUSP: 2.0000 | Freq: Every day | NASAL | Status: DC

## 2015-03-09 MED ORDER — ONDANSETRON HCL 4 MG PO TABS: 4.0000 mg | ORAL_TABLET | Freq: Three times a day (TID) | ORAL | Status: DC | PRN

## 2015-03-09 MED ORDER — AMPHETAMINE-DEXTROAMPHET ER 30 MG PO CP24: 30.0000 mg | ORAL_CAPSULE | ORAL | Status: DC

## 2015-03-09 MED ORDER — TRAMADOL HCL 50 MG PO TABS: 50.0000 mg | ORAL_TABLET | Freq: Two times a day (BID) | ORAL | Status: DC | PRN

## 2015-03-09 NOTE — Assessment & Plan Note (Signed)
Refer to integrative therapies.

## 2015-03-09 NOTE — Assessment & Plan Note (Signed)
Poor control on embeda 80/3.2 daily and MSIR rare use (#30 provided 05/2014). Discussed options - transition to different narcotic (consider nucynta), add different short acting pain med (tramadol), referral to integrative therapies, or referral to pain management.  Pt opts to try tramadol and integrative therapies.

## 2015-03-09 NOTE — Telephone Encounter (Signed)
Rx printed and in Kim's box. plz schedule pt sooner appointment to discuss pain control

## 2015-03-09 NOTE — Assessment & Plan Note (Signed)
Appreciate rheum care of patient.  

## 2015-03-09 NOTE — Progress Notes (Signed)
Pre visit review using our clinic review tool, if applicable. No additional management support is needed unless otherwise documented below in the visit note. 

## 2015-03-09 NOTE — Patient Instructions (Addendum)
Continue embeda for now. May use tramadol for breakthrough pain twice daily as needed (start at 1/2 tablet). We will refer you to integrative therapies (check with them to see if they take your insurance).  Update me with how you're doing in 2 weeks, sooner if needed. Continue staying active as up to now.  Return as needed or in 1 month for follow up. We will consider pain management referral down the road. w.

## 2015-03-09 NOTE — Progress Notes (Signed)
BP 124/82 mmHg  Pulse 88  Temp(Src) 98.4 F (36.9 C) (Oral)  Wt 134 lb 12 oz (61.122 kg)  LMP 07/25/1998   CC: discuss pain management  Subjective:    Patient ID: Rachel Duran, female    DOB: 03/04/1956, 59 y.o.   MRN: [id number]  HPI: Rachel Duran is a 59 y.o. female presenting on 03/09/2015 for Pain   H/o chronic pain from fibromyalgia, osteoarthritis, interstitial cystitis, cervical disc disease. H/o C5/C6/C7 fusion.   Current pain regimen is embeda 1 tab daily (morphine naltrexone 80/3.2mg ) + MSIR 15mg  Q4 hours prn severe pain. Receives #90 embeda every 3 months. Last MSIR refill was #30 on 05/2014 (rare use, only mildly helps). Also on lyrica 150mg  TID.   She feels that the last several months to years have gotten worse. Usually winter months are bad and spring/summer better regarding pain but last few years doesn't see improvements with warmer weather.   Exercise regimen - daily stretches, goes to heated pool, weekly yoga, walks dog daily 30 min.   Has bad constipation to narcotics. Takes linzess, miralax (2-3 capfuls daily) and benefiber daily with good effect.   Percocet causes nausea - tolerated ok with zofran Hydrocodone causes nausea. Oxycontin - severe nausea/vomiting Fentanyl patch - not effective  Has not tried tramadol, nucynta.   Using indomethacin cream for hip bursitis and OA as well as voltaren gel.   2d h/o sinus pressure and headache. Headaches cause nausea. Blowing nose with clear mucous. No fevers/chills.   Relevant past medical, surgical, family and social history reviewed and updated as indicated. Interim medical history since our last visit reviewed. Allergies and medications reviewed and updated. Current Outpatient Prescriptions on File Prior to Visit  Medication Sig  . acyclovir (ZOVIRAX) 800 MG tablet Take one-half tablet by  mouth every day  . ALPRAZolam (XANAX) 1 MG tablet Take 1 tablet (1 mg total) by mouth 3 (three) times daily as  needed for anxiety. With 1/2 tab at night time  . amLODipine (NORVASC) 5 MG tablet Take 5 mg by mouth every morning. For [name]  . amphetamine-dextroamphetamine (ADDERALL XR) 30 MG 24 hr capsule Take 1 capsule (30 mg total) by mouth every morning.  . ARIPiprazole (ABILIFY) 10 MG tablet Take 5 mg by mouth at bedtime.   Marland Kitchen aspirin EC 81 MG tablet Take 81 mg by mouth every other day.   . baclofen (LIORESAL) 10 MG tablet Take 1 tablet (10 mg total) by mouth 3 (three) times daily as needed.  . Calcium-Magnesium-Vitamin D 480-16-553 MG-MG-UNIT TB24 Take 1 tablet by mouth 2 (two) times daily.  Marland Kitchen co-enzyme Q-10 50 MG capsule Take 50 mg by mouth daily.  . cyanocobalamin (,VITAMIN B-12,) 1000 MCG/ML injection Inject 1 mL (1,000 mcg total) into the muscle every 21 ( twenty-one) days.  . diclofenac sodium (VOLTAREN) 1 % GEL Apply 1 g topically 2 (two) times daily as needed (pain).   Marland Kitchen esomeprazole (NEXIUM) 40 MG capsule TAKE ONE CAPSULE BY MOUTH TWICE DAILY   . Estradiol 10 MCG TABS vaginal tablet Place 1 tablet (10 mcg total) vaginally every Tuesday, Thursday, and Saturday at 6 PM. vagifem  . Eszopiclone 3 MG TABS Take 1 tablet (3 mg total) by mouth at bedtime as needed. Take immediately before bedtime  . hydrocortisone (ANUSOL-HC) 25 MG suppository Place 25 mg rectally 2 (two) times daily as needed for hemorrhoids or itching.  . lamoTRIgine (LAMICTAL) 150 MG tablet Take 150 mg by mouth daily.   Marland Kitchen  LINZESS 290 MCG CAPS capsule TAKE 1 CAPSULE BY MOUTH EVERY DAY  . MAGNESIUM PO Take 1,000 mg by mouth 3 (three) times daily.  . Manganese 50 MG TABS Take 50 mg by mouth daily.   . Morphine-Naltrexone 80-3.2 MG CPCR Take 1 tablet by mouth daily.  . NONFORMULARY OR COMPOUNDED ITEM Testosterone propionate 2% in white petrolatum, apply bid for 6 weeks and then daily as directed.  60 grams.  . pentosan polysulfate (ELMIRON) 100 MG capsule Take 200 mg by mouth 2 (two) times daily.   . Polyethylene Glycol 3350  (MIRALAX PO) Take 17 g by mouth 4 (four) times daily.   . pregabalin (LYRICA) 150 MG capsule Take 150 mg by mouth 3 (three) times daily.   . ranitidine (ZANTAC) 150 MG tablet Take 150 mg by mouth at bedtime as needed for heartburn.  . thiamine 100 MG tablet Take 100 mg by mouth daily.  . Triamcinolone Acetonide (NASACORT AQ NA) Place 2 sprays into the nose daily as needed (allergies).   . ursodiol (ACTIGALL) 300 MG capsule TAKE ONE TABLET BY MOUTH TWICE DAILY  . [DISCONTINUED] [facility] SLOW RELEASE IRON 143 (45 FE) MG TBCR TAKE 1 TABLET EVERY DAY   No current facility-administered medications on file prior to visit.    Review of Systems Per HPI unless specifically indicated above     Objective:    BP 124/82 mmHg  Pulse 88  Temp(Src) 98.4 F (36.9 C) (Oral)  Wt 134 lb 12 oz (61.122 kg)  LMP 07/25/1998  Wt Readings from Last 3 Encounters:  03/09/15 134 lb 12 oz (61.122 kg)  01/29/15 134 lb (60.782 kg)  12/19/14 136 lb (61.689 kg)    Physical Exam  Constitutional: She appears well-developed and well-nourished. No distress.  HENT:  Head: Normocephalic and atraumatic.  Right Ear: Hearing and tympanic membrane normal.  Left Ear: Hearing normal.  Nose: No mucosal edema or rhinorrhea. Right sinus exhibits maxillary sinus tenderness and frontal sinus tenderness. Left sinus exhibits maxillary sinus tenderness and frontal sinus tenderness.  Mouth/Throat: Uvula is midline, oropharynx is clear and moist and mucous membranes are normal. No oropharyngeal exudate, posterior oropharyngeal edema, posterior oropharyngeal erythema or tonsillar abscesses.  Nasal mucosal pallor  Eyes: Conjunctivae and EOM are normal. Pupils are equal, round, and reactive to light. No scleral icterus.  Neck: Normal range of motion. Neck supple.  Cardiovascular: Normal rate, regular rhythm, normal heart sounds and intact distal pulses.   No murmur heard. Pulmonary/Chest: Effort normal and breath sounds normal. No  respiratory distress. She has no wheezes. She has no rales.  Enlarged R clavicle head  Musculoskeletal: She exhibits no edema.  Lymphadenopathy:    She has no cervical adenopathy.  Skin: Skin is warm and dry. No rash noted.  Nursing note and vitals reviewed.     Assessment & Plan:   Problem List Items Addressed This Visit    Osteoarthritis    Appreciate rheum care of patient.      Relevant Medications   traMADol (ULTRAM) 50 MG tablet   Other Relevant Orders   Ambulatory referral to Physical Therapy   Fibromyalgia    Refer to integrative therapies.      Relevant Orders   Ambulatory referral to Physical Therapy   Chronic pain syndrome - Primary    Poor control on embeda 80/3.2 daily and MSIR rare use (#30 provided 05/2014). Discussed options - transition to different narcotic (consider nucynta), add different short acting pain med (tramadol), referral to integrative  therapies, or referral to pain management.  Pt opts to try tramadol and integrative therapies.      Relevant Orders   Ambulatory referral to Physical Therapy   Clavicle enlargement    Recommended check with rheum. Discussed possible CT vs MRI for further evaluation of enlarged clavicle head on right      Sinus headache    No evidence of bacterial infection - anticipate sinus congestion related. recommended restart nasal steroid (flonase sent to pharmacy) and nasal saline. Update if fever, productive mucous develops, or not improving as expected.      Relevant Medications   traMADol (ULTRAM) 50 MG tablet       Follow up plan: Return in about 1 month (around 04/09/2015), or as needed, for follow up visit.

## 2015-03-09 NOTE — Assessment & Plan Note (Signed)
Recommended check with rheum. Discussed possible CT vs MRI for further evaluation of enlarged clavicle head on right

## 2015-03-09 NOTE — Assessment & Plan Note (Signed)
No evidence of bacterial infection - anticipate sinus congestion related. recommended restart nasal steroid (flonase sent to pharmacy) and nasal saline. Update if fever, productive mucous develops, or not improving as expected.

## 2015-03-09 NOTE — Telephone Encounter (Signed)
Appt scheduled. Had a cancellation this afternoon. Will give Rx's at that time.

## 2015-03-11 ENCOUNTER — Telehealth: Payer: Self-pay | Admitting: Family Medicine

## 2015-03-11 NOTE — Telephone Encounter (Signed)
Pt called integrative therapies. They do not accept her insurance. Each session would be $105.00. Pt does not have the money to pay. What other recommendations do you have for the pt? She is not ready to go to a pain management center. Please advise

## 2015-03-12 ENCOUNTER — Other Ambulatory Visit (INDEPENDENT_AMBULATORY_CARE_PROVIDER_SITE_OTHER): Payer: Medicare Other

## 2015-03-12 DIAGNOSIS — R6882 Decreased libido: Secondary | ICD-10-CM

## 2015-03-12 NOTE — Telephone Encounter (Signed)
Has tramadol helped at all? If urology PT helps with arthritis/fibromyalgia may suggest she visit them for an eval. May try nucynta ER next month when due for embeda refill to see if any improvement in pain control with new long acting narcotic.

## 2015-03-12 NOTE — Telephone Encounter (Signed)
Spoke with patient and she said that the tramadol has taken the edge off. She has only needed to take half of a pill when she does take it,  but does have to take a zofran with it, or she feels very nauseous. She is going to research nucynta. She will go back to [facility] for PT because she did think it helped. She follows up with you in a month and will give you an update at that time.

## 2015-03-13 LAB — TESTOSTERONE: TESTOSTERONE: 80 ng/dL — AB (ref 10–70)

## 2015-03-15 ENCOUNTER — Other Ambulatory Visit: Payer: Self-pay | Admitting: Obstetrics and Gynecology

## 2015-03-15 DIAGNOSIS — R6882 Decreased libido: Secondary | ICD-10-CM

## 2015-03-17 ENCOUNTER — Encounter: Payer: Self-pay | Admitting: Obstetrics and Gynecology

## 2015-03-17 ENCOUNTER — Telehealth: Payer: Self-pay

## 2015-03-17 NOTE — Telephone Encounter (Signed)
Telephone encounter created for review and advised by Dr.Silva.

## 2015-03-17 NOTE — Telephone Encounter (Signed)
Non-Urgent Medical Question  Message [id number]   From  LAKINA MCINTIRE   To  Nunzio Cobbs, MD   Sent  03/17/2015 8:55 AM     Thank you Dr. Quincy Simmonds for notifying me of my testosterone results. I will decrease to once a day for 4-5 days per week and will call your office to schedule a 6 week lab appointment. I just wanted to let you know that since we have decreased the testosterone to once a day I am definitely not getting the results I was before. I am still having arousal difficulties as well as problems achieving orgasms and ongoing problems with dryness. The testosterone has helped all these problems just not as well since we have had to decrease it. Where can we go from here? I don't want to stop the testosterone but I didn't know if there was anything else we could do along with it, or just what your recommendation would be.  Thank you so very much,  Rachel Duran      Responsible Party    Pool - [facility] Pool No one has taken responsibility for this message.     No actions have been taken on this message.

## 2015-03-17 NOTE — Telephone Encounter (Signed)
Routing mychart message to Dr.Silva for review and advise. I will be happy to call the patient with further recommendations.

## 2015-03-18 NOTE — Telephone Encounter (Signed)
Message sent to patient through My Chart directly.  I suggested an office visit and some therapy changes.

## 2015-03-19 NOTE — Telephone Encounter (Signed)
Spoke with patient. Office visit scheduled for 03/27/2015 at 1 pm with Dr.Silva. Patient is agreeable to date and time.  Routing to provider for final review. Patient agreeable to disposition. Will close encounter.   Patient aware provider will review message and nurse will return call if any additional advice or change of disposition.

## 2015-03-22 ENCOUNTER — Encounter: Payer: Self-pay | Admitting: Family Medicine

## 2015-03-23 ENCOUNTER — Other Ambulatory Visit: Payer: Self-pay | Admitting: Gastroenterology

## 2015-03-23 ENCOUNTER — Encounter: Payer: Self-pay | Admitting: Family Medicine

## 2015-03-23 MED ORDER — ESZOPICLONE 3 MG PO TABS: 3.0000 mg | ORAL_TABLET | Freq: Every evening | ORAL | Status: DC | PRN

## 2015-03-23 NOTE — Telephone Encounter (Signed)
Printed and in Kim's box. Can it be phoned in?

## 2015-03-23 NOTE — Telephone Encounter (Signed)
Faxed to [facility]

## 2015-03-24 ENCOUNTER — Other Ambulatory Visit: Payer: Self-pay

## 2015-03-24 MED ORDER — AMLODIPINE BESYLATE 5 MG PO TABS: 5.0000 mg | ORAL_TABLET | Freq: Every morning | ORAL | Status: DC

## 2015-03-25 ENCOUNTER — Other Ambulatory Visit: Payer: Self-pay | Admitting: *Deleted

## 2015-03-25 MED ORDER — AMLODIPINE BESYLATE 5 MG PO TABS: 5.0000 mg | ORAL_TABLET | Freq: Every day | ORAL | Status: DC

## 2015-03-27 ENCOUNTER — Ambulatory Visit (INDEPENDENT_AMBULATORY_CARE_PROVIDER_SITE_OTHER): Payer: Medicare Other | Admitting: Obstetrics and Gynecology

## 2015-03-27 ENCOUNTER — Encounter: Payer: Self-pay | Admitting: Obstetrics and Gynecology

## 2015-03-27 VITALS — BP 120/78 | HR 80 | Resp 14 | Wt 136.0 lb

## 2015-03-27 DIAGNOSIS — N952 Postmenopausal atrophic vaginitis: Secondary | ICD-10-CM | POA: Diagnosis not present

## 2015-03-27 DIAGNOSIS — R6882 Decreased libido: Secondary | ICD-10-CM

## 2015-03-27 MED ORDER — OSPEMIFENE 60 MG PO TABS: 1.0000 | ORAL_TABLET | Freq: Every day | ORAL | Status: DC

## 2015-03-27 NOTE — Patient Instructions (Signed)
Ospemifene oral tablets What is this medicine? OSPEMIFENE (os PEM i feen) is used to treat painful sexual intercourse in females after menopause, a symptom of menopause that occurs due to changes in and around the vagina. This medicine may be used for other purposes; ask your health care provider or pharmacist if you have questions. COMMON BRAND NAME(S): Osphena What should I tell my health care provider before I take this medicine? They need to know if you have any of these conditions: -cancer, such as breast, uterine, or other cancer -heart disease -history of blood clots -history of stroke -history of vaginal bleeding -liver disease -premenopausal -smoke tobacco -an unusual or allergic reaction to ospemifene, other medicines, foods, dyes, or preservatives -pregnant or trying to get pregnant -breast-feeding How should I use this medicine? Take this medicine by mouth with a glass of water. Take this medicine with food. Follow the directions on the prescription label. Do not take your medicine more often than directed. Talk to your pediatrician regarding the use of this medicine in children. Special care may be needed. Overdosage: If you think you've taken too much of this medicine contact a poison control center or emergency room at once. Overdosage: If you think you have taken too much of this medicine contact a poison control center or emergency room at once. NOTE: This medicine is only for you. Do not share this medicine with others. What if I miss a dose? If you miss a dose, take it as soon as you can. If it is almost time for your next dose, take only that dose. Do not take double or extra doses. What may interact with this medicine? -doxycycline -estrogens -fluconazole -furosemide -glyburide -ketoconazole -phenytoin -rifampin -warfarin This list may not describe all possible interactions. Give your health care provider a list of all the medicines, herbs, non-prescription  drugs, or dietary supplements you use. Also tell them if you smoke, drink alcohol, or use illegal drugs. Some items may interact with your medicine. What should I watch for while using this medicine? Visit your health care professional for regular checks on your progress. You will need a regular breast and pelvic exam and Pap smear while on this medicine. You should also discuss the need for regular mammograms with your health care professional, and follow his or her guidelines for these tests. Also, periodically discuss the need to continue taking this medicine. Taking this medicine for long periods of time may increase your risk for serious side effects. This medicine can increase the risk of developing a condition (endometrial hyperplasia) that may lead to cancer of the lining of the uterus. Taking progestins, another hormone drug, with this medicine lowers the risk of developing this condition. Therefore, if your uterus has not been removed (by a hysterectomy), your doctor may prescribe a progestin for you to take together with your estrogen. You should know, however, that taking estrogens with progestins may have additional health risks. You should discuss the use of estrogens and progestins with your health care professional to determine the benefits and risks for you. This medicine can rarely cause blood clots. You should avoid long periods of bed rest while taking this medicine. If you are going to have surgery, tell your doctor or health care professional that you are taking this medicine. This medicine should be stopped at least 4-6 weeks before surgery. After surgery, it should be restarted only after you are walking again. It should not be restarted while you still need long periods of bed   rest. You should not smoke while taking this medicine. Smoking may also increase your risk of blood clots. Smoking can also decrease the effects of this medicine. This medicine does not prevent hot flashes. It  may cause hot flashes in some patients. If you have any reason to think you are pregnant; stop taking this medicine at once and contact your doctor or health care professional. What side effects may I notice from receiving this medicine? Side effects that you should report to your doctor or health care professional as soon as possible: -breathing problems -changes in vision -confusion, trouble speaking or understanding -new breast lumps -pain, swelling, warmth in the leg -pelvic pain or pressure -severe headaches -sudden chest pain -sudden numbness or weakness of the face, arm or leg -trouble walking, dizziness, loss of balance or coordination -unusual vaginal bleeding patterns -vaginal discharge that is bloody or brown Side effects that usually do not require medical attention (Report these to your doctor or health care professional if they continue or are bothersome.): -hot flushes or flashes -increased sweating -muscle cramps -vaginal discharge (white or clear) This list may not describe all possible side effects. Call your doctor for medical advice about side effects. You may report side effects to FDA at 1-800-FDA-1088. Where should I keep my medicine? Keep out of the reach of children. Store at room temperature between 20 and 25 degrees C (68 and 77 degrees F). Protect from light. Keep container tightly closed. Throw away any unused medicine after the expiration date. NOTE: This sheet is a summary. It may not cover all possible information. If you have questions about this medicine, talk to your doctor, pharmacist, or health care provider.  2015, Elsevier/Gold Standard. (2013-09-24 11:46:51)  

## 2015-03-27 NOTE — Progress Notes (Signed)
GYNECOLOGY  VISIT   HPI: 59 y.o.   Married  Caucasian  female   G2P2002 with Patient's last menstrual period was 07/25/1998.   here for  Medication follow up.  Having Plaquinil toxicity of the eye.  Will see a retinal specialist.  Diagnosis of atypical macular degeneration.   GYNECOLOGIC HISTORY: Patient's last menstrual period was 07/25/1998. Contraception:  Menopausal hormone therapy: Testosterone propionate 2% Last mammogram: 10/16/14 Bi-rads 1 neg Last pap smear: 12/19/14 wnl neg HR HPV        OB History    Gravida Para Term Preterm AB TAB SAB Ectopic Multiple Living   2 2 2       2          Patient Active Problem List   Diagnosis Date Noted  . Sinus headache 03/09/2015  . Clavicle enlargement 12/13/2014  . Prediabetes 12/13/2014  . Plantar fasciitis, bilateral 09/11/2014  . Advanced care planning/counseling discussion 06/10/2014  . Medicare annual wellness visit, initial 06/10/2014  . Abnormal thyroid function test 06/10/2014  . Chronic pain syndrome 06/10/2014  . CFS (chronic fatigue syndrome) 04/08/2014  . RUQ abdominal pain 12/09/2013  . Calculus of bile duct without mention of cholecystitis or obstruction 09/17/2013  . Postmenopausal atrophic vaginitis 10/19/2012  . 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 Cervical disc disorder with radiculopathy of cervical region 05/28/2010  . NECK PAIN, CHRONIC 10/01/2009  . Synovitis and tenosynovitis 04/30/2009  . Anemia of chronic disease 03/17/2009  . CHEST PAIN UNSPECIFIED 02/28/2008  . APHTHOUS ULCERS 01/31/2008  . INSOMNIA, CHRONIC 11/09/2007  . Drug-induced constipation 10/11/2007  . ALLERGIC RHINITIS 04/12/2007  . Depression 03/05/2007  . [name] syndrome 03/05/2007  . GERD 03/05/2007  . ROSACEA 03/05/2007  . Osteoarthritis 03/05/2007  . NEURALGIA 03/05/2007  . 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 CYSTITIS, CHRONIC INTERSTITIAL 12/06/2006  . Fibromyalgia 12/06/2006    Past Medical History   Diagnosis Date  . Disorders of porphyrin metabolism   . Irritable bowel syndrome   . Fibromyalgia   . Osteoarthritis   . GERD (gastroesophageal reflux disease)   . Allergy   . Chronic pain   . Cholecystitis   . Internal hemorrhoids   . Interstitial cystitis 06-06-12    hx.  . Raynauds disease     hx.  . 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 C6- C7 -hx. past fusion(bone graft used)  . Anemia   . PONV (postoperative nausea and vomiting)     now uses stomach blockers and no ponv  . Abdominal pain last 4 months    and nausea also  . Depression     bipolar depression    Past Surgical History  Procedure Laterality Date  . Cervical fusion  2004    C5/6  . Cystoscopy    . Nasal sinus surgery    . Gyn surgery    . Hysteroscopy with ablation    . Breast enhancement surgery  2010  . Nasal sinus surgery      x5  . Ercp  05/22/2012    Procedure: ENDOSCOPIC RETROGRADE CHOLANGIOPANCREATOGRAPHY (ERCP);  Surgeon: [name], MD,FACG;  Location: [facility];  Service: Endoscopy;  Laterality: N/A;  . Cesarean section      x 1  . Hernia repair      inguinal  . Cholecystectomy  06/11/2012    Procedure: LAPAROSCOPIC CHOLECYSTECTOMY WITH INTRAOPERATIVE CHOLANGIOGRAM;  Surgeon: [name], MD,FACS;  Location: [facility];  Service: General;  Laterality:  N/A;  . Bunionectomy Bilateral yrs ago  . Ercp N/A 09/17/2013    Procedure: ENDOSCOPIC RETROGRADE CHOLANGIOPANCREATOGRAPHY (ERCP);  Surgeon: [name], MD;  Location: [facility];  Service: Endoscopy;  Laterality: N/A;  . Hemorrhoid banding  09-23-13    --Dr. Greer Pickerel  . Breast implant exchange  10/2014    exchange saline implants, B mastopexy/capsulorraphy ([facility])    Current Outpatient Prescriptions  Medication Sig Dispense Refill  . acyclovir (ZOVIRAX) 800 MG tablet Take one-half tablet by  mouth every day 45 tablet 2  . ALPRAZolam (XANAX) 1 MG tablet Take 1 tablet (1 mg total) by mouth 3  (three) times daily as needed for anxiety. With 1/2 tab at night time 60 tablet 0  . amLODipine (NORVASC) 5 MG tablet Take 1 tablet (5 mg total) by mouth daily. For [name] 90 tablet 1  . amphetamine-dextroamphetamine (ADDERALL XR) 30 MG 24 hr capsule Take 1 capsule (30 mg total) by mouth every morning. 90 capsule 0  . ARIPiprazole (ABILIFY) 10 MG tablet Take 5 mg by mouth at bedtime.     Marland Kitchen aspirin EC 81 MG tablet Take 81 mg by mouth every other day.     . baclofen (LIORESAL) 10 MG tablet Take 1 tablet (10 mg total) by mouth 3 (three) times daily as needed. 270 tablet 1  . Calcium-Magnesium-Vitamin D 600-40-500 MG-MG-UNIT TB24 Take 1 tablet by mouth 2 (two) times daily.    Marland Kitchen co-enzyme Q-10 50 MG capsule Take 50 mg by mouth daily.    . cyanocobalamin (,VITAMIN B-12,) 1000 MCG/ML injection Inject 1 mL (1,000 mcg total) into the muscle every 21 ( twenty-one) days. 4 mL 3  . diclofenac sodium (VOLTAREN) 1 % GEL Apply 1 g topically 2 (two) times daily as needed (pain).     Marland Kitchen esomeprazole (NEXIUM) 40 MG capsule TAKE 1 CAPSULE BY MOUTH TWICE DAILY 60 capsule 2  . Estradiol 10 MCG TABS vaginal tablet Place 1 tablet (10 mcg total) vaginally every Tuesday, Thursday, and Saturday at 6 PM. vagifem 12 tablet 11  . Eszopiclone 3 MG TABS Take 1 tablet (3 mg total) by mouth at bedtime as needed. Take immediately before bedtime 90 tablet 0  . fluticasone (FLONASE) 50 MCG/ACT nasal spray Place 2 sprays into both nostrils daily. 16 g 3  . hydrocortisone (ANUSOL-HC) 25 MG suppository Place 25 mg rectally 2 (two) times daily as needed for hemorrhoids or itching.    . lamoTRIgine (LAMICTAL) 150 MG tablet Take 150 mg by mouth daily.     Marland Kitchen LINZESS 290 MCG CAPS capsule TAKE 1 CAPSULE BY MOUTH EVERY DAY 30 capsule 3  . MAGNESIUM PO Take 1,000 mg by mouth 3 (three) times daily.    . Manganese 50 MG TABS Take 50 mg by mouth daily.     . Morphine-Naltrexone 80-3.2 MG CPCR Take 1 tablet by mouth daily. 90 capsule 0  .  NONFORMULARY OR COMPOUNDED ITEM Testosterone propionate 2% in white petrolatum, apply bid for 6 weeks and then daily as directed.  60 grams. 60 each 0  . ondansetron (ZOFRAN) 4 MG tablet Take 1 tablet (4 mg total) by mouth every 8 (eight) hours as needed for nausea or vomiting. 30 tablet 1  . pentosan polysulfate (ELMIRON) 100 MG capsule Take 200 mg by mouth 2 (two) times daily.     . Polyethylene Glycol 3350 (MIRALAX PO) Take 17 g by mouth 4 (four) times daily.     . pregabalin (LYRICA) 150 MG  capsule Take 150 mg by mouth 3 (three) times daily.     . ranitidine (ZANTAC) 150 MG tablet Take 150 mg by mouth at bedtime as needed for heartburn.    . thiamine 100 MG tablet Take 100 mg by mouth daily.    . traMADol (ULTRAM) 50 MG tablet Take 1 tablet (50 mg total) by mouth 2 (two) times daily as needed. 40 tablet 0  . Triamcinolone Acetonide (NASACORT AQ NA) Place 2 sprays into the nose daily as needed (allergies).     . ursodiol (ACTIGALL) 300 MG capsule TAKE ONE TABLET BY MOUTH TWICE DAILY 60 capsule 3  . [DISCONTINUED] [facility] SLOW RELEASE IRON 143 (45 FE) MG TBCR TAKE 1 TABLET EVERY DAY 30 tablet 0   No current facility-administered medications for this visit.     ALLERGIES: Codeine phosphate; Cymbalta; Celebrex; Inh; Silenor; Meperidine hcl; and Sulfamethoxazole  Family History  Problem Relation Age of Onset  . CAD Father 48    MI, nonsmoker  . Esophageal cancer Father 39  . Scleroderma Mother   . Hypertension Mother   . Esophageal cancer Paternal Grandfather   . Diabetes Maternal Grandmother   . Stroke Neg Hx   . Stomach cancer Father   . Stomach cancer Paternal Grandfather     Social History   Social History  . Marital Status: Married    Spouse Name: N/A  . Number of Children: N/A  . Years of Education: N/A   Occupational History  . retired    Social History Main Topics  . Smoking status: Never Smoker   . Smokeless tobacco: Never Used  . Alcohol Use: No  . Drug Use: No  .  Sexual Activity:    Partners: Male    Birth Control/ Protection: Post-menopausal     Comment: vasectomy   Other Topics Concern  . 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  PHYSICAL EXAMINATION:    BP 120/78 mmHg  Pulse 80  Resp 14  Wt 136 lb (61.689 kg)  LMP 07/25/1998    General appearance: alert, cooperative and appears stated age.   ASSESSMENT  Decreased libido. On testosterone.  Vaginal atrophy.  On vagifem.  Polypharmacy.  Plaquinil toxicity.   PLAN  Counseled regarding osphena.  Discussed risk of arterial and venous thromboembolism and uterine/breast CA. Will try this and stop vagifem.    Continue testosterone 5 days per week.  Recheck testosterone level in 3 weeks.  Discussed complimentary medicine and diminishing some of her medication.  She will approach her PCP regarding this.   An After Visit Summary was printed and given to the patient.  _25_____ minutes face to face time of which over 50% was spent in counseling.

## 2015-03-29 ENCOUNTER — Encounter: Payer: Self-pay | Admitting: Family Medicine

## 2015-03-31 MED ORDER — ESZOPICLONE 3 MG PO TABS: 3.0000 mg | ORAL_TABLET | Freq: Every evening | ORAL | Status: DC | PRN

## 2015-03-31 NOTE — Telephone Encounter (Signed)
Message left notifying patient and Rx placed up front for pick up. I'm not sure why [facility] is saying they didn't receive it. Typically when something doesn't go through, I will get a "fax failed" sheet come through the fax machine. I didn't get one for this. I know the Rx was for generic, I think she was just saying Lunesta because it was easier to type, not saying that we sent in name-brand.

## 2015-03-31 NOTE — Telephone Encounter (Signed)
Looks like this was faxed 8/29, pt states [facility] did not receive. Also, we asked for generic form last time but pt states we asked for brand? Printed again and in Kim's box. plz check with patient on this. Pt states she wants to pick up.

## 2015-04-16 ENCOUNTER — Ambulatory Visit (INDEPENDENT_AMBULATORY_CARE_PROVIDER_SITE_OTHER): Payer: Medicare Other | Admitting: Family Medicine

## 2015-04-16 ENCOUNTER — Encounter: Payer: Self-pay | Admitting: Family Medicine

## 2015-04-16 ENCOUNTER — Telehealth: Payer: Self-pay

## 2015-04-16 ENCOUNTER — Encounter: Payer: Self-pay | Admitting: Obstetrics and Gynecology

## 2015-04-16 VITALS — BP 126/78 | HR 88 | Temp 98.3°F | Wt 131.8 lb

## 2015-04-16 DIAGNOSIS — M898X8 Other specified disorders of bone, other site: Secondary | ICD-10-CM

## 2015-04-16 DIAGNOSIS — Z23 Encounter for immunization: Secondary | ICD-10-CM | POA: Diagnosis not present

## 2015-04-16 DIAGNOSIS — G894 Chronic pain syndrome: Secondary | ICD-10-CM

## 2015-04-16 DIAGNOSIS — H3553 Other dystrophies primarily involving the sensory retina: Secondary | ICD-10-CM | POA: Insufficient documentation

## 2015-04-16 DIAGNOSIS — H359 Unspecified retinal disorder: Secondary | ICD-10-CM | POA: Diagnosis not present

## 2015-04-16 DIAGNOSIS — M797 Fibromyalgia: Secondary | ICD-10-CM

## 2015-04-16 DIAGNOSIS — M89319 Hypertrophy of bone, unspecified shoulder: Secondary | ICD-10-CM

## 2015-04-16 NOTE — Patient Instructions (Addendum)
Flu shot today. Continue current medicines. We will watch for effect of upcoming PT Ask Dr Baird Cancer to send me update after you see him. Return in 2 months for medicare wellness visit - let me know towards end of October if we want to continue embeda or trial nucynta.  Nice to see you today, call us with questions.

## 2015-04-16 NOTE — Assessment & Plan Note (Addendum)
Discussed this. Persistently poor pain control. Pt will continue embeda (60mo supply to end mid 05/2015) and let me know end of Oct if desires to transition to nucynta. RTC 2 mo f/u visit and medicare wellness visit. Continue 1/2 tab tramadol for breakthrough pain.

## 2015-04-16 NOTE — Telephone Encounter (Signed)
Please have patient reschedule her lab visit for 3 - 4 weeks from now.  Thanks!

## 2015-04-16 NOTE — Telephone Encounter (Signed)
Routing my chart message as seen below to [place] for review and advise.

## 2015-04-16 NOTE — Telephone Encounter (Signed)
Telephone encounter created for Dr.Silva's review of patient's mychart message.

## 2015-04-16 NOTE — Assessment & Plan Note (Signed)
S/p MRI by rheum, thought arthritis related.

## 2015-04-16 NOTE — Progress Notes (Signed)
BP 126/78 mmHg  Pulse 88  Temp(Src) 98.3 F (36.8 C) (Oral)  Wt 131 lb 12 oz (59.761 kg)  LMP 07/25/1998   CC: f/u visit  Subjective:    Patient ID: Rachel Duran, female    DOB: 1955-09-10, 59 y.o.   MRN: [id number]  HPI: Rachel Duran is a 59 y.o. female presenting on 04/16/2015 for Follow-up   H/o chronic pain from fibromyalgia, osteoarthritis, interstitial cystitis, cervical disc disease. H/o C5/C6/C7 fusion.   Current pain regimen is embeda 1 tab daily (morphine naltrexone 80/3.2mg ) + MSIR 15mg  Q4 hours prn severe pain. Receives #90 embeda every 3 months. Last MSIR refill was #30 on 05/2014 (rare use, only mildly helps). Using indomethacin cream for hip bursitis and OA as well as voltaren gel. Also on lyrica 150mg  TID. Has bad constipation to narcotics. Takes linzess, miralax (2-3 capfuls daily) and benefiber daily with good effect.   Exercise regimen - daily stretches, goes to heated pool, weekly yoga, walks dog daily 30 min.   Percocet causes nausea - tolerated ok with zofran Hydrocodone causes nausea. Oxycontin - severe nausea/vomiting Fentanyl patch - not effective  Currently suffering from L hip bursitis, on prolonged oral prednisone taper by Dr Estanislado Pandy (saw her on 04/03/2015). To start [facility]).   Last visit we referred to integrative therapies - could not afford. She instead returned to [facility]. We also tried tramadol - this takes edge off pain. She is taking 1/2 tablet when she uses this. She does occasional need zofran with tramadol to combat nausea.   Chronically enlarged R clavicle head - checked with rheum - she did have MRI done previously - thought arthritis related.   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 Followed by Dr Ellie Lunch. Referred to retinal specialist Dr Baird Cancer (04/29/2015).   Relevant past medical, surgical, family and social history reviewed  and updated as indicated. Interim medical history since our last visit reviewed. Allergies and medications reviewed and updated. Current Outpatient Prescriptions on File Prior to Visit  Medication Sig  . acyclovir (ZOVIRAX) 800 MG tablet Take one-half tablet by  mouth every day  . ALPRAZolam (XANAX) 1 MG tablet Take 1 tablet (1 mg total) by mouth 3 (three) times daily as needed for anxiety. With 1/2 tab at night time  . amLODipine (NORVASC) 5 MG tablet Take 1 tablet (5 mg total) by mouth daily. For [name]  . amphetamine-dextroamphetamine (ADDERALL XR) 30 MG 24 hr capsule Take 1 capsule (30 mg total) by mouth every morning.  . ARIPiprazole (ABILIFY) 10 MG tablet Take 5 mg by mouth at bedtime.   Marland Kitchen aspirin EC 81 MG tablet Take 81 mg by mouth every other day.   . baclofen (LIORESAL) 10 MG tablet Take 1 tablet (10 mg total) by mouth 3 (three) times daily as needed.  . Calcium-Magnesium-Vitamin D 952-84-132 MG-MG-UNIT TB24 Take 1 tablet by mouth 2 (two) times daily.  Marland Kitchen co-enzyme Q-10 50 MG capsule Take 50 mg by mouth daily.  . cyanocobalamin (,VITAMIN B-12,) 1000 MCG/ML injection Inject 1 mL (1,000 mcg total) into the muscle every 21 ( twenty-one) days.  . diclofenac sodium (VOLTAREN) 1 % GEL Apply 1 g topically 2 (two) times daily as needed (pain).   Marland Kitchen esomeprazole (NEXIUM) 40 MG capsule TAKE 1 CAPSULE BY MOUTH TWICE DAILY  . Eszopiclone 3 MG TABS Take 1 tablet (3 mg total) by mouth at bedtime as needed. Take  immediately before bedtime  . fluticasone (FLONASE) 50 MCG/ACT nasal spray Place 2 sprays into both nostrils daily.  . hydrocortisone (ANUSOL-HC) 25 MG suppository Place 25 mg rectally 2 (two) times daily as needed for hemorrhoids or itching.  . lamoTRIgine (LAMICTAL) 150 MG tablet Take 150 mg by mouth daily.   Marland Kitchen LINZESS 290 MCG CAPS capsule TAKE 1 CAPSULE BY MOUTH EVERY DAY  . MAGNESIUM PO Take 1,000 mg by mouth 3 (three) times daily.  . Manganese 50 MG TABS Take 50 mg by mouth daily.   .  Morphine-Naltrexone 80-3.2 MG CPCR Take 1 tablet by mouth daily.  . NONFORMULARY OR COMPOUNDED ITEM Testosterone propionate 2% in white petrolatum, apply bid for 6 weeks and then daily as directed.  60 grams.  . ondansetron (ZOFRAN) 4 MG tablet Take 1 tablet (4 mg total) by mouth every 8 (eight) hours as needed for nausea or vomiting.  . Ospemifene (OSPHENA) 60 MG TABS Take 1 tablet by mouth daily.  . pentosan polysulfate (ELMIRON) 100 MG capsule Take 200 mg by mouth 2 (two) times daily.   . Polyethylene Glycol 3350 (MIRALAX PO) Take 17 g by mouth 4 (four) times daily.   . pregabalin (LYRICA) 150 MG capsule Take 150 mg by mouth 3 (three) times daily.   . ranitidine (ZANTAC) 150 MG tablet Take 150 mg by mouth at bedtime as needed for heartburn.  . thiamine 100 MG tablet Take 100 mg by mouth daily.  . traMADol (ULTRAM) 50 MG tablet Take 1 tablet (50 mg total) by mouth 2 (two) times daily as needed.  . Triamcinolone Acetonide (NASACORT AQ NA) Place 2 sprays into the nose daily as needed (allergies).   . ursodiol (ACTIGALL) 300 MG capsule TAKE ONE TABLET BY MOUTH TWICE DAILY  . [DISCONTINUED] [facility] SLOW RELEASE IRON 143 (45 FE) MG TBCR TAKE 1 TABLET EVERY DAY   No current facility-administered medications on file prior to visit.    Review of Systems Per HPI unless specifically indicated above     Objective:    BP 126/78 mmHg  Pulse 88  Temp(Src) 98.3 F (36.8 C) (Oral)  Wt 131 lb 12 oz (59.761 kg)  LMP 07/25/1998  Wt Readings from Last 3 Encounters:  04/16/15 131 lb 12 oz (59.761 kg)  03/27/15 136 lb (61.689 kg)  03/09/15 134 lb 12 oz (61.122 kg)    Physical Exam  Constitutional: She appears well-developed and well-nourished. No distress.  HENT:  Mouth/Throat: Oropharynx is clear and moist. No oropharyngeal exudate.  Eyes: Conjunctivae and EOM are normal. Pupils are equal, round, and reactive to light. No scleral icterus.  Neck: Normal range of motion. Neck supple.  Cardiovascular:  Normal rate, regular rhythm, normal heart sounds and intact distal pulses.   No murmur heard. Pulmonary/Chest: Effort normal and breath sounds normal. No respiratory distress. She has no wheezes. She has no rales.  Stable R clavicle head enlargement  Musculoskeletal: She exhibits no edema.  Skin: Skin is warm and dry. No rash noted.  Psychiatric: She has a normal mood and affect.  Nursing note and vitals reviewed.     Assessment & Plan:   Problem List Items Addressed This Visit    Retinal pigment epithelium abnormality    Unclear dx - thought plaquenil toxicity related. Pending appt with retinal specialist.      Fibromyalgia    Await effect of upcoming PT      Clavicle enlargement    S/p MRI by rheum, thought arthritis related.  Chronic pain syndrome - Primary    Discussed this. Persistently poor pain control. Pt will continue embeda (56mo supply to end mid 05/2015) and let me know end of Oct if desires to transition to nucynta. RTC 2 mo f/u visit and medicare wellness visit. Continue 1/2 tab tramadol for breakthrough pain.       Other Visit Diagnoses    Need for influenza vaccination        Relevant Orders    Flu Vaccine QUAD 36+ mos PF IM (Fluarix & Fluzone Quad PF) (Completed)        Follow up plan: Return in about 2 months (around 06/16/2015), or as needed, for medicare wellness visit.

## 2015-04-16 NOTE — Telephone Encounter (Signed)
Spoke with patient. Advised of message as seen below from [place]. Patient is agreeable. Lab appointment moved to 05/11/2015 at 10:30 am. Agreeable to date and time.  Routing to provider for final review. Patient agreeable to disposition. Will close encounter.

## 2015-04-16 NOTE — Assessment & Plan Note (Signed)
Unclear dx - thought plaquenil toxicity related. Pending appt with retinal specialist.

## 2015-04-16 NOTE — Telephone Encounter (Signed)
Visit Follow-Up Question  Message [id number]   From  PORCHIA SINKLER   To  Nunzio Cobbs, MD   Sent  04/16/2015 11:12 AM     Hi Dr. Quincy Simmonds, I have a question about my upcoming labs. I am due to come in on 9/29 to check my testosterone level. I went out of town for 7 days and forgot to take it with me, I am now back on track using the testosterone but I didn't know if you would want me to delay my lab work since I was off it for awhile or go ahead and come in on the 29th.   Thanks,  Rachel Duran      Responsible Party    Pool - [facility] Clinical Pool No one has taken responsibility for this message.     No actions have been taken on this message.

## 2015-04-16 NOTE — Addendum Note (Signed)
Addended by: Royann Shivers A on: 04/16/2015 03:41 PM   Modules accepted: Medications

## 2015-04-16 NOTE — Progress Notes (Signed)
Pre visit review using our clinic review tool, if applicable. No additional management support is needed unless otherwise documented below in the visit note. 

## 2015-04-16 NOTE — Assessment & Plan Note (Signed)
Await effect of upcoming PT

## 2015-04-16 NOTE — Telephone Encounter (Signed)
Spoke with patient regarding Dr.Silva's recommendations. Please see telephone encounter dated with today's date.  Routing to provider for final review. Patient agreeable to disposition. Will close encounter.

## 2015-04-17 ENCOUNTER — Other Ambulatory Visit: Payer: Self-pay

## 2015-04-17 NOTE — Telephone Encounter (Signed)
lmtcb to see if she needs refill on testosterone, looks like she is coming in for lab work to check her level on 05/11/15.//kn

## 2015-04-19 ENCOUNTER — Other Ambulatory Visit: Payer: Self-pay | Admitting: Obstetrics and Gynecology

## 2015-04-20 MED ORDER — NONFORMULARY OR COMPOUNDED ITEM: Status: DC

## 2015-04-20 NOTE — Telephone Encounter (Signed)
Rx called in 03/27/15 with no refills. 2 refills called in today

## 2015-04-20 NOTE — Telephone Encounter (Signed)
Spoke with patient-states was told to continue using the topical testosterone 5 days/week and has appointment to recheck level on 05/11/15. States will not have enough to last until then, please advise if can refill.//kn

## 2015-04-20 NOTE — Telephone Encounter (Signed)
Medication refill request: Osphena  Last AEX:  12/19/14 Dr. Quincy Simmonds Next AEX: 01/08/16 Dr. Quincy Simmonds Last MMG (if hormonal medication request): 10/16/14 [id number]:Neg Refill authorized: 03/27/15 #30tabs w/ 2R.  Will call pharmacy to make sure they receive rx by fax

## 2015-04-23 ENCOUNTER — Encounter: Payer: Self-pay | Admitting: Family Medicine

## 2015-04-23 ENCOUNTER — Other Ambulatory Visit: Payer: Medicare Other

## 2015-05-11 ENCOUNTER — Other Ambulatory Visit: Payer: Medicare Other

## 2015-05-11 DIAGNOSIS — R6882 Decreased libido: Secondary | ICD-10-CM

## 2015-05-12 ENCOUNTER — Encounter: Payer: Self-pay | Admitting: Obstetrics and Gynecology

## 2015-05-12 ENCOUNTER — Telehealth: Payer: Self-pay

## 2015-05-12 LAB — TESTOSTERONE: TESTOSTERONE: 26 ng/dL (ref 10–70)

## 2015-05-12 NOTE — Telephone Encounter (Signed)
Non-Urgent Medical Question  Message [id number]   From  Rachel Duran   To  Rachel Cobbs, MD   Sent  05/12/2015 9:39 AM     Hi Dr. Quincy Duran I received your message in regards to my testosterone level, I thought maybe it had dropped I could tell by the way I am feeling. Yes I agree that it would be a good plan to increase it to once daily for 7 days. Do you need to check my labs again in the future?   Thanks,  Rachel Duran      Responsible Party    Pool - [facility] Pool No one has taken responsibility for this message.     No actions have been taken on this message.

## 2015-05-12 NOTE — Telephone Encounter (Signed)
Routing message to Dr.Silva for review of mychart message as seen below from patient.

## 2015-05-13 ENCOUNTER — Other Ambulatory Visit: Payer: Self-pay | Admitting: Obstetrics and Gynecology

## 2015-05-13 DIAGNOSIS — R6882 Decreased libido: Secondary | ICD-10-CM

## 2015-05-13 NOTE — Telephone Encounter (Signed)
I will send the patient a My Chart message to recheck her testosterone in 2 months.  Thank you.

## 2015-05-18 ENCOUNTER — Other Ambulatory Visit: Payer: Self-pay | Admitting: Family Medicine

## 2015-05-26 DIAGNOSIS — H3553 Other dystrophies primarily involving the sensory retina: Secondary | ICD-10-CM

## 2015-05-26 HISTORY — DX: Other dystrophies primarily involving the sensory retina: H35.53

## 2015-05-27 ENCOUNTER — Encounter: Payer: Self-pay | Admitting: Family Medicine

## 2015-05-28 MED ORDER — TAPENTADOL HCL ER 50 MG PO TB12: 1.0000 | ORAL_TABLET | Freq: Two times a day (BID) | ORAL | Status: DC

## 2015-05-28 MED ORDER — OXYCODONE-ACETAMINOPHEN 5-325 MG PO TABS: 1.0000 | ORAL_TABLET | Freq: Three times a day (TID) | ORAL | Status: DC | PRN

## 2015-05-28 NOTE — Telephone Encounter (Signed)
printed nucynta/oxycodone Rxs and in [facility] box.

## 2015-06-02 ENCOUNTER — Encounter: Payer: Self-pay | Admitting: Family Medicine

## 2015-06-02 DIAGNOSIS — G894 Chronic pain syndrome: Secondary | ICD-10-CM

## 2015-06-03 MED ORDER — MORPHINE-NALTREXONE 80-3.2 MG PO CPCR: 1.0000 | ORAL_CAPSULE | Freq: Every day | ORAL | Status: DC

## 2015-06-03 NOTE — Telephone Encounter (Signed)
printed embeda Rx and placed in [facility]' box.

## 2015-06-04 MED ORDER — AMPHETAMINE-DEXTROAMPHET ER 30 MG PO CP24: 30.0000 mg | ORAL_CAPSULE | ORAL | Status: DC

## 2015-06-04 NOTE — Telephone Encounter (Signed)
Rx given to husband at his appt today.

## 2015-06-04 NOTE — Telephone Encounter (Signed)
adderall printed and in [place]' box.

## 2015-06-08 ENCOUNTER — Other Ambulatory Visit: Payer: Self-pay | Admitting: Family Medicine

## 2015-06-08 ENCOUNTER — Other Ambulatory Visit (INDEPENDENT_AMBULATORY_CARE_PROVIDER_SITE_OTHER): Payer: Medicare Other

## 2015-06-08 DIAGNOSIS — R7303 Prediabetes: Secondary | ICD-10-CM | POA: Diagnosis not present

## 2015-06-08 DIAGNOSIS — D638 Anemia in other chronic diseases classified elsewhere: Secondary | ICD-10-CM | POA: Diagnosis not present

## 2015-06-08 DIAGNOSIS — Z79899 Other long term (current) drug therapy: Secondary | ICD-10-CM

## 2015-06-08 DIAGNOSIS — M1380 Other specified arthritis, unspecified site: Secondary | ICD-10-CM

## 2015-06-08 LAB — CBC WITH DIFFERENTIAL/PLATELET
BASOS ABS: 0 10*3/uL (ref 0.0–0.1)
Basophils Relative: 0.4 % (ref 0.0–3.0)
EOS PCT: 0.8 % (ref 0.0–5.0)
Eosinophils Absolute: 0.1 10*3/uL (ref 0.0–0.7)
HEMATOCRIT: 33.5 % — AB (ref 36.0–46.0)
Hemoglobin: 11 g/dL — ABNORMAL LOW (ref 12.0–15.0)
LYMPHS ABS: 2 10*3/uL (ref 0.7–4.0)
LYMPHS PCT: 29 % (ref 12.0–46.0)
MCHC: 32.7 g/dL (ref 30.0–36.0)
MCV: 86.2 fl (ref 78.0–100.0)
MONOS PCT: 8 % (ref 3.0–12.0)
Monocytes Absolute: 0.5 10*3/uL (ref 0.1–1.0)
NEUTROS ABS: 4.2 10*3/uL (ref 1.4–7.7)
NEUTROS PCT: 61.8 % (ref 43.0–77.0)
PLATELETS: 440 10*3/uL — AB (ref 150.0–400.0)
RBC: 3.89 Mil/uL (ref 3.87–5.11)
RDW: 14.2 % (ref 11.5–15.5)
WBC: 6.7 10*3/uL (ref 4.0–10.5)

## 2015-06-08 LAB — COMPREHENSIVE METABOLIC PANEL
ALBUMIN: 4.1 g/dL (ref 3.5–5.2)
ALK PHOS: 69 U/L (ref 39–117)
ALT: 8 U/L (ref 0–35)
AST: 16 U/L (ref 0–37)
BUN: 7 mg/dL (ref 6–23)
CALCIUM: 9.4 mg/dL (ref 8.4–10.5)
CHLORIDE: 105 meq/L (ref 96–112)
CO2: 29 mEq/L (ref 19–32)
Creatinine, Ser: 0.59 mg/dL (ref 0.40–1.20)
GFR: 111.52 mL/min (ref >60.00)
Glucose, Bld: 120 mg/dL — ABNORMAL HIGH (ref 70–99)
POTASSIUM: 4.6 meq/L (ref 3.5–5.1)
Sodium: 142 mEq/L (ref 135–145)
TOTAL PROTEIN: 6.6 g/dL (ref 6.0–8.3)
Total Bilirubin: 0.3 mg/dL (ref 0.2–1.2)

## 2015-06-08 LAB — TSH: TSH: 2.82 u[IU]/mL (ref 0.35–4.50)

## 2015-06-08 LAB — HEMOGLOBIN A1C: Hgb A1c MFr Bld: 6.1 % (ref 4.6–6.5)

## 2015-06-09 ENCOUNTER — Other Ambulatory Visit: Payer: Medicare Other

## 2015-06-15 ENCOUNTER — Encounter: Payer: Self-pay | Admitting: *Deleted

## 2015-06-15 ENCOUNTER — Other Ambulatory Visit: Payer: Self-pay

## 2015-06-15 ENCOUNTER — Encounter: Payer: Self-pay | Admitting: Family Medicine

## 2015-06-15 ENCOUNTER — Ambulatory Visit (INDEPENDENT_AMBULATORY_CARE_PROVIDER_SITE_OTHER): Payer: Medicare Other | Admitting: Family Medicine

## 2015-06-15 VITALS — BP 110/70 | HR 80 | Temp 98.0°F | Ht 65.5 in | Wt 129.5 lb

## 2015-06-15 DIAGNOSIS — R5382 Chronic fatigue, unspecified: Secondary | ICD-10-CM

## 2015-06-15 DIAGNOSIS — Z1159 Encounter for screening for other viral diseases: Secondary | ICD-10-CM

## 2015-06-15 DIAGNOSIS — K219 Gastro-esophageal reflux disease without esophagitis: Secondary | ICD-10-CM

## 2015-06-15 DIAGNOSIS — K8050 Calculus of bile duct without cholangitis or cholecystitis without obstruction: Secondary | ICD-10-CM

## 2015-06-15 DIAGNOSIS — F331 Major depressive disorder, recurrent, moderate: Secondary | ICD-10-CM

## 2015-06-15 DIAGNOSIS — M5010 Cervical disc disorder with radiculopathy, unspecified cervical region: Secondary | ICD-10-CM

## 2015-06-15 DIAGNOSIS — H359 Unspecified retinal disorder: Secondary | ICD-10-CM

## 2015-06-15 DIAGNOSIS — K5903 Drug induced constipation: Secondary | ICD-10-CM

## 2015-06-15 DIAGNOSIS — G9332 Myalgic encephalomyelitis/chronic fatigue syndrome: Secondary | ICD-10-CM

## 2015-06-15 DIAGNOSIS — N952 Postmenopausal atrophic vaginitis: Secondary | ICD-10-CM

## 2015-06-15 DIAGNOSIS — Z0000 Encounter for general adult medical examination without abnormal findings: Secondary | ICD-10-CM

## 2015-06-15 DIAGNOSIS — G894 Chronic pain syndrome: Secondary | ICD-10-CM

## 2015-06-15 DIAGNOSIS — D638 Anemia in other chronic diseases classified elsewhere: Secondary | ICD-10-CM

## 2015-06-15 DIAGNOSIS — R7303 Prediabetes: Secondary | ICD-10-CM

## 2015-06-15 DIAGNOSIS — H3553 Other dystrophies primarily involving the sensory retina: Secondary | ICD-10-CM

## 2015-06-15 DIAGNOSIS — M1380 Other specified arthritis, unspecified site: Secondary | ICD-10-CM

## 2015-06-15 DIAGNOSIS — Z7189 Other specified counseling: Secondary | ICD-10-CM

## 2015-06-15 DIAGNOSIS — M797 Fibromyalgia: Secondary | ICD-10-CM

## 2015-06-15 MED ORDER — ESOMEPRAZOLE MAGNESIUM 40 MG PO CPDR: 40.0000 mg | DELAYED_RELEASE_CAPSULE | Freq: Two times a day (BID) | ORAL | Status: DC

## 2015-06-15 NOTE — Assessment & Plan Note (Addendum)
Recheck in 1 month 

## 2015-06-15 NOTE — Assessment & Plan Note (Addendum)
Stable on linzess. Continue - narcotic induced.

## 2015-06-15 NOTE — Progress Notes (Signed)
Pre visit review using our clinic review tool, if applicable. No additional management support is needed unless otherwise documented below in the visit note. 

## 2015-06-15 NOTE — Progress Notes (Signed)
BP 110/70 mmHg  Pulse 80  Temp(Src) 98 F (36.7 C) (Oral)  Ht 5' 5.5" (1.664 m)  Wt 129 lb 8 oz (58.741 kg)  BMI 21.21 kg/m2  LMP 07/25/1998   CC: medicare wellness visit  Subjective:    Patient ID: Rachel Duran, female    DOB: [birth year], 59 y.o.   MRN: [id number]  HPI: Rachel Duran is a 59 y.o. female presenting on 06/15/2015 for Annual Exam   Chronic pain - see prior note and emails. This month we trialed nucynta but it caused agitation and uncontrollable jerking reflexes of arms and legs, as well as insomnia. We have returned to prior Embeda dose. She is also undergoing physical therapy for bursitis. Feels she's doing well with this.   3 wks ago picked up virus - diarrhea, vomiting. This lasted 1 week.  Passes hearing screen. Eye exam with eye doctor. Denies falls/depression/anhedonia  Preventative: Flex sigmoidoscopy - 11/2006 [facility] Plan) rec rpt 10 yrs. iFOB today. Denies bowel changes or blood in stool.  Well woman with Dr Josefa Half OBGYN seen 01/2015. Mammogram WNL 09/2014 dexa - 2014 with mild osteopenia per Dr Quincy Simmonds Flu shot - yearly pneumova 2011, prevnar 2015 Td 2007 Advanced directives: has at home, will bring me copy. HCPOA is husband. Seat belt use discussed Sunscreen use discussed. No changing moles. Sees derm.  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 Interim medical history since our last visit reviewed. Allergies and medications reviewed and updated. Current Outpatient Prescriptions on File Prior to Visit  Medication Sig  . acyclovir (ZOVIRAX) 800 MG tablet Take one-half tablet by  mouth every day  . ALPRAZolam (XANAX) 1 MG tablet Take 1 tablet (1 mg total) by mouth 3 (three) times daily as needed for  anxiety. With 1/2 tab at night time  . amLODipine (NORVASC) 5 MG tablet Take 1 tablet (5 mg total) by mouth daily. For [name]  . amphetamine-dextroamphetamine (ADDERALL XR) 30 MG 24 hr capsule Take 1 capsule (30 mg total) by mouth every morning.  . ARIPiprazole (ABILIFY) 10 MG tablet Take 5 mg by mouth at bedtime.   Marland Kitchen aspirin EC 81 MG tablet Take 81 mg by mouth every other day.   . baclofen (LIORESAL) 10 MG tablet Take 1 tablet (10 mg total) by mouth 3 (three) times daily as needed.  . Calcium-Magnesium-Vitamin D [id number] MG-MG-UNIT TB24 Take 1 tablet by mouth 2 (two) times daily.  Marland Kitchen co-enzyme Q-10 50 MG capsule Take 50 mg by mouth daily.  . cyanocobalamin (,VITAMIN B-12,) 1000 MCG/ML injection Inject 1 mL (1,000 mcg total) into the muscle every 21 ( twenty-one) days.  . diclofenac sodium (VOLTAREN) 1 % GEL Apply 1 g topically 2 (two) times daily as needed (pain).   . Eszopiclone 3 MG TABS Take 1 tablet (3 mg total) by mouth at bedtime as needed. Take immediately before bedtime  . fluticasone (FLONASE) 50 MCG/ACT nasal spray Place 2 sprays into both nostrils daily.  . hydrocortisone (ANUSOL-HC) 25 MG suppository Place 25 mg rectally 2 (two) times daily as needed for hemorrhoids or itching.  . lamoTRIgine (LAMICTAL) 150 MG tablet Take 150 mg by mouth daily.   Marland Kitchen LINZESS 290 MCG CAPS capsule TAKE 1 CAPSULE EVERY DAY  . MAGNESIUM PO Take 1,000 mg by  mouth 3 (three) times daily.  . Manganese 50 MG TABS Take 50 mg by mouth daily.   . Morphine-Naltrexone 80-3.2 MG CPCR Take 1 tablet by mouth daily.  . Multiple Vitamins-Minerals (ICAPS AREDS 2) CAPS Take 1 capsule by mouth daily.  . NONFORMULARY OR COMPOUNDED ITEM Testosterone propionate 2% in white petrolatum, apply daily for 5 days per week.  Dispense:  60 grams.  . ondansetron (ZOFRAN) 4 MG tablet Take 1 tablet (4 mg total) by mouth every 8 (eight) hours as needed for nausea or vomiting.  . Ospemifene (OSPHENA) 60 MG TABS Take 1 tablet by mouth  daily.  Marland Kitchen oxyCODONE-acetaminophen (ROXICET) 5-325 MG tablet Take 1 tablet by mouth every 8 (eight) hours as needed for severe pain.  . pentosan polysulfate (ELMIRON) 100 MG capsule Take 200 mg by mouth 2 (two) times daily.   . Polyethylene Glycol 3350 (MIRALAX PO) Take 17 g by mouth 4 (four) times daily.   . pregabalin (LYRICA) 150 MG capsule Take 150 mg by mouth 3 (three) times daily.   . ranitidine (ZANTAC) 150 MG tablet Take 150 mg by mouth at bedtime as needed for heartburn.  . thiamine 100 MG tablet Take 100 mg by mouth daily.  . traMADol (ULTRAM) 50 MG tablet Take 1 tablet (50 mg total) by mouth 2 (two) times daily as needed.  . Triamcinolone Acetonide (NASACORT AQ NA) Place 2 sprays into the nose daily as needed (allergies).   . ursodiol (ACTIGALL) 300 MG capsule TAKE ONE TABLET BY MOUTH TWICE DAILY  . [DISCONTINUED] [employer] SLOW RELEASE IRON 143 (45 FE) MG TBCR TAKE 1 TABLET EVERY DAY   No current facility-administered medications on file prior to visit.    Review of Systems  Constitutional: Negative for fever, chills, activity change, appetite change, fatigue and unexpected weight change.  HENT: Negative for hearing loss.   Eyes: Negative for visual disturbance.  Respiratory: Negative for cough, chest tightness, shortness of breath and wheezing.   Cardiovascular: Negative for chest pain, palpitations and leg swelling.  Gastrointestinal: Positive for vomiting and diarrhea. Negative for nausea, abdominal pain, constipation, blood in stool and abdominal distention.       Recent GI bug  Genitourinary: Negative for hematuria and difficulty urinating.  Musculoskeletal: Negative for myalgias, arthralgias and neck pain.  Skin: Negative for rash.  Neurological: Negative for dizziness, seizures, syncope and headaches.  Hematological: Negative for adenopathy. Does not bruise/bleed easily.  Psychiatric/Behavioral: Negative for dysphoric mood. The patient is nervous/anxious (recent eye  diagnosis).    Per HPI unless specifically indicated in ROS section     Objective:    BP 110/70 mmHg  Pulse 80  Temp(Src) 98 F (36.7 C) (Oral)  Ht 5' 5.5" (1.664 m)  Wt 129 lb 8 oz (58.741 kg)  BMI 21.21 kg/m2  LMP 07/25/1998  Wt Readings from Last 3 Encounters:  06/15/15 129 lb 8 oz (58.741 kg)  04/16/15 131 lb 12 oz (59.761 kg)  03/27/15 136 lb (61.689 kg)    Physical Exam  Constitutional: She is oriented to person, place, and time. She appears well-developed and well-nourished. No distress.  HENT:  Head: Normocephalic and atraumatic.  Right Ear: Hearing, tympanic membrane, external ear and ear canal normal.  Left Ear: Hearing, tympanic membrane, external ear and ear canal normal.  Nose: Nose normal.  Mouth/Throat: Uvula is midline, oropharynx is clear and moist and mucous membranes are normal. No oropharyngeal exudate, posterior oropharyngeal edema or posterior oropharyngeal erythema.  Eyes: Conjunctivae and EOM are  normal. Pupils are equal, round, and reactive to light. No scleral icterus.  Neck: Normal range of motion. Neck supple. No thyromegaly present.  Cardiovascular: Normal rate, regular rhythm, normal heart sounds and intact distal pulses.   No murmur heard. Pulses:      Radial pulses are 2+ on the right side, and 2+ on the left side.  Pulmonary/Chest: Effort normal and breath sounds normal. No respiratory distress. She has no wheezes. She has no rales.  Abdominal: Soft. Bowel sounds are normal. She exhibits no distension and no mass. There is no tenderness. There is no rebound and no guarding.  Musculoskeletal: Normal range of motion. She exhibits no edema.  Lymphadenopathy:    She has no cervical adenopathy.  Neurological: She is alert and oriented to person, place, and time.  CN grossly intact, station and gait intact  Skin: Skin is warm and dry. No rash noted.  Psychiatric: She has a normal mood and affect. Her behavior is normal. Judgment and thought content  normal.  Nursing note and vitals reviewed.  Results for orders placed or performed in visit on 06/08/15  Hemoglobin A1c  Result Value Ref Range   Hgb A1c MFr Bld 6.1 4.6 - 6.5 %  CBC with Differential/Platelet  Result Value Ref Range   WBC 6.7 4.0 - 10.5 K/uL   RBC 3.89 3.87 - 5.11 Mil/uL   Hemoglobin 11.0 (L) 12.0 - 15.0 g/dL   HCT 33.5 (L) 36.0 - 46.0 %   MCV 86.2 78.0 - 100.0 fl   MCHC 32.7 30.0 - 36.0 g/dL   RDW 14.2 11.5 - 15.5 %   Platelets 440.0 (H) 150.0 - 400.0 K/uL   Neutrophils Relative % 61.8 43.0 - 77.0 %   Lymphocytes Relative 29.0 12.0 - 46.0 %   Monocytes Relative 8.0 3.0 - 12.0 %   Eosinophils Relative 0.8 0.0 - 5.0 %   Basophils Relative 0.4 0.0 - 3.0 %   Neutro Abs 4.2 1.4 - 7.7 K/uL   Lymphs Abs 2.0 0.7 - 4.0 K/uL   Monocytes Absolute 0.5 0.1 - 1.0 K/uL   Eosinophils Absolute 0.1 0.0 - 0.7 K/uL   Basophils Absolute 0.0 0.0 - 0.1 K/uL  Comprehensive metabolic panel  Result Value Ref Range   Sodium 142 135 - 145 mEq/L   Potassium 4.6 3.5 - 5.1 mEq/L   Chloride 105 96 - 112 mEq/L   CO2 29 19 - 32 mEq/L   Glucose, Bld 120 (H) 70 - 99 mg/dL   BUN 7 6 - 23 mg/dL   Creatinine, Ser 0.59 0.40 - 1.20 mg/dL   Total Bilirubin 0.3 0.2 - 1.2 mg/dL   Alkaline Phosphatase 69 39 - 117 U/L   AST 16 0 - 37 U/L   ALT 8 0 - 35 U/L   Total Protein 6.6 6.0 - 8.3 g/dL   Albumin 4.1 3.5 - 5.2 g/dL   Calcium 9.4 8.4 - 10.5 mg/dL   GFR 111.52 >60.00 mL/min  TSH  Result Value Ref Range   TSH 2.82 0.35 - 4.50 uIU/mL      Assessment & Plan:  Hep C screen next labwork Problem List Items Addressed This Visit    Stargardt's disease    Reviewed recent dx with patient.      Seronegative arthritis    Continue f/u with rheum Dr Estanislado Pandy      Prediabetes    Reviewed with patient.       Postmenopausal atrophic vaginitis    Continue osphena.  Sees GYN      Medicare annual wellness visit, subsequent - Primary    I have personally reviewed the Medicare Annual Wellness  questionnaire and have noted 1. The patient's medical and social history 2. Their use of alcohol, tobacco or illicit drugs 3. Their current medications and supplements 4. The patient's functional ability including ADL's, fall risks, home safety risks and hearing or visual impairment. Cognitive function has been assessed and addressed as indicated.  5. Diet and physical activity 6. Evidence for depression or mood disorders The patients weight, height, BMI have been recorded in the chart. I have made referrals, counseling and provided education to the patient based on review of the above and I have provided the pt with a written personalized care plan for preventive services. Provider list updated.. See scanned questionairre as needed for further documentation. Reviewed preventative protocols and updated unless pt declined.       MDD (major depressive disorder), recurrent episode, moderate ([place])    Followed by psych.      Health maintenance examination    Preventative protocols reviewed and updated unless pt declined. Discussed healthy diet and lifestyle.       GERD    Continue BID PPI.       Fibromyalgia    Continue f/u with rheum.      Drug-induced constipation    Stable on linzess. Continue - narcotic induced.      Chronic pain syndrome    Continue embeda. UDS today if sample can be provided otherwise next month when returns for labs.      CFS (chronic fatigue syndrome)    Continue adderall XR.       Cervical disc disorder with radiculopathy of cervical region    Continue chronic narcotic therapy.      Calculus of bile duct    Recurrent despite cholecystectomy. Continue ursodiol.      Anemia of chronic disease    Recheck in 1 month       Relevant Orders   CBC with Differential/Platelet   Ferritin   IBC panel   Vitamin B12   Folate   Advanced care planning/counseling discussion    Advanced directives: has at home, will bring me copy. HCPOA is husband.         Other Visit Diagnoses    Need for hepatitis C screening test        Relevant Orders    Hepatitis C antibody, reflex        Follow up plan: Return in about 6 months (around 12/13/2015), or as needed, for follow up visit.

## 2015-06-15 NOTE — Assessment & Plan Note (Signed)
Preventative protocols reviewed and updated unless pt declined. Discussed healthy diet and lifestyle.  

## 2015-06-15 NOTE — Assessment & Plan Note (Signed)

## 2015-06-15 NOTE — Assessment & Plan Note (Signed)
Reviewed with patient

## 2015-06-15 NOTE — Assessment & Plan Note (Signed)
Continue BID PPI.

## 2015-06-15 NOTE — Assessment & Plan Note (Addendum)
Continue embeda. UDS today if sample can be provided otherwise next month when returns for labs.

## 2015-06-15 NOTE — Assessment & Plan Note (Signed)
Followed by psych

## 2015-06-15 NOTE — Patient Instructions (Addendum)
Hep C screen next labwork Return in 1 month for lab visit only to recheck blood counts. UDS today. Bring me copy of advanced directive. Return as needed or in 6 months for follow up visit.  Health Maintenance, Female Adopting a healthy lifestyle and getting preventive care can go a long way to promote health and wellness. Talk with your health care provider about what schedule of regular examinations is right for you. This is a good chance for you to check in with your provider about disease prevention and staying healthy. In between checkups, there are plenty of things you can do on your own. Experts have done a lot of research about which lifestyle changes and preventive measures are most likely to keep you healthy. Ask your health care provider for more information. WEIGHT AND DIET  Eat a healthy diet  Be sure to include plenty of vegetables, fruits, low-fat dairy products, and lean protein.  Do not eat a lot of foods high in solid fats, added sugars, or salt.  Get regular exercise. This is one of the most important things you can do for your health.  Most adults should exercise for at least 150 minutes each week. The exercise should increase your heart rate and make you sweat (moderate-intensity exercise).  Most adults should also do strengthening exercises at least twice a week. This is in addition to the moderate-intensity exercise.  Maintain a healthy weight  Body mass index (BMI) is a measurement that can be used to identify possible weight problems. It estimates body fat based on height and weight. Your health care provider can help determine your BMI and help you achieve or maintain a healthy weight.  For females 43 years of age and older:   A BMI below 18.5 is considered underweight.  A BMI of 18.5 to 24.9 is normal.  A BMI of 25 to 29.9 is considered overweight.  A BMI of 30 and above is considered obese.  Watch levels of cholesterol and blood lipids  You should  start having your blood tested for lipids and cholesterol at 57 years of age, then have this test every 5 years.  You may need to have your cholesterol levels checked more often if:  Your lipid or cholesterol levels are high.  You are older than 57 years of age.  You are at high risk for heart disease.  CANCER SCREENING   Lung Cancer  Lung cancer screening is recommended for adults 27-18 years old who are at high risk for lung cancer because of a history of smoking.  A yearly low-dose CT scan of the lungs is recommended for people who:  Currently smoke.  Have quit within the past 15 years.  Have at least a 30-pack-year history of smoking. A pack year is smoking an average of one pack of cigarettes a day for 1 year.  Yearly screening should continue until it has been 15 years since you quit.  Yearly screening should stop if you develop a health problem that would prevent you from having lung cancer treatment.  Breast Cancer  Practice breast self-awareness. This means understanding how your breasts normally appear and feel.  It also means doing regular breast self-exams. Let your health care provider know about any changes, no matter how small.  If you are in your 20s or 30s, you should have a clinical breast exam (CBE) by a health care provider every 1-3 years as part of a regular health exam.  If you are 40 or  older, have a CBE every year. Also consider having a breast X-ray (mammogram) every year.  If you have a family history of breast cancer, talk to your health care provider about genetic screening.  If you are at high risk for breast cancer, talk to your health care provider about having an MRI and a mammogram every year.  Breast cancer gene (BRCA) assessment is recommended for women who have family members with BRCA-related cancers. BRCA-related cancers include:  Breast.  Ovarian.  Tubal.  Peritoneal cancers.  Results of the assessment will determine the  need for genetic counseling and BRCA1 and BRCA2 testing. Cervical Cancer Your health care provider may recommend that you be screened regularly for cancer of the pelvic organs (ovaries, uterus, and vagina). This screening involves a pelvic examination, including checking for microscopic changes to the surface of your cervix (Pap test). You may be encouraged to have this screening done every 3 years, beginning at age 79.  For women ages 9-65, health care providers may recommend pelvic exams and Pap testing every 3 years, or they may recommend the Pap and pelvic exam, combined with testing for human papilloma virus (HPV), every 5 years. Some types of HPV increase your risk of cervical cancer. Testing for HPV may also be done on women of any age with unclear Pap test results.  Other health care providers may not recommend any screening for nonpregnant women who are considered low risk for pelvic cancer and who do not have symptoms. Ask your health care provider if a screening pelvic exam is right for you.  If you have had past treatment for cervical cancer or a condition that could lead to cancer, you need Pap tests and screening for cancer for at least 20 years after your treatment. If Pap tests have been discontinued, your risk factors (such as having a new sexual partner) need to be reassessed to determine if screening should resume. Some women have medical problems that increase the chance of getting cervical cancer. In these cases, your health care provider may recommend more frequent screening and Pap tests. Colorectal Cancer  This type of cancer can be detected and often prevented.  Routine colorectal cancer screening usually begins at 57 years of age and continues through 57 years of age.  Your health care provider may recommend screening at an earlier age if you have risk factors for colon cancer.  Your health care provider may also recommend using home test kits to check for hidden blood in  the stool.  A small camera at the end of a tube can be used to examine your colon directly (sigmoidoscopy or colonoscopy). This is done to check for the earliest forms of colorectal cancer.  Routine screening usually begins at age 12.  Direct examination of the colon should be repeated every 5-10 years through 57 years of age. However, you may need to be screened more often if early forms of precancerous polyps or small growths are found. Skin Cancer  Check your skin from head to toe regularly.  Tell your health care provider about any new moles or changes in moles, especially if there is a change in a mole's shape or color.  Also tell your health care provider if you have a mole that is larger than the size of a pencil eraser.  Always use sunscreen. Apply sunscreen liberally and repeatedly throughout the day.  Protect yourself by wearing long sleeves, pants, a wide-brimmed hat, and sunglasses whenever you are outside. HEART DISEASE, DIABETES,  AND HIGH BLOOD PRESSURE   High blood pressure causes heart disease and increases the risk of stroke. High blood pressure is more likely to develop in:  People who have blood pressure in the high end of the normal range (130-139/85-89 mm Hg).  People who are overweight or obese.  People who are African American.  If you are 18-39 years of age, have your blood pressure checked every 3-5 years. If you are 40 years of age or older, have your blood pressure checked every year. You should have your blood pressure measured twice--once when you are at a hospital or clinic, and once when you are not at a hospital or clinic. Record the average of the two measurements. To check your blood pressure when you are not at a hospital or clinic, you can use:  An automated blood pressure machine at a pharmacy.  A home blood pressure monitor.  If you are between 55 years and 79 years old, ask your health care provider if you should take aspirin to prevent  strokes.  Have regular diabetes screenings. This involves taking a blood sample to check your fasting blood sugar level.  If you are at a normal weight and have a low risk for diabetes, have this test once every three years after 57 years of age.  If you are overweight and have a high risk for diabetes, consider being tested at a younger age or more often. PREVENTING INFECTION  Hepatitis B  If you have a higher risk for hepatitis B, you should be screened for this virus. You are considered at high risk for hepatitis B if:  You were born in a country where hepatitis B is common. Ask your health care provider which countries are considered high risk.  Your parents were born in a high-risk country, and you have not been immunized against hepatitis B (hepatitis B vaccine).  You have HIV or AIDS.  You use needles to inject street drugs.  You live with someone who has hepatitis B.  You have had sex with someone who has hepatitis B.  You get hemodialysis treatment.  You take certain medicines for conditions, including cancer, organ transplantation, and autoimmune conditions. Hepatitis C  Blood testing is recommended for:  Everyone born from 1945 through 1965.  Anyone with known risk factors for hepatitis C. Sexually transmitted infections (STIs)  You should be screened for sexually transmitted infections (STIs) including gonorrhea and chlamydia if:  You are sexually active and are younger than 57 years of age.  You are older than 57 years of age and your health care provider tells you that you are at risk for this type of infection.  Your sexual activity has changed since you were last screened and you are at an increased risk for chlamydia or gonorrhea. Ask your health care provider if you are at risk.  If you do not have HIV, but are at risk, it may be recommended that you take a prescription medicine daily to prevent HIV infection. This is called pre-exposure prophylaxis  (PrEP). You are considered at risk if:  You are sexually active and do not regularly use condoms or know the HIV status of your partner(s).  You take drugs by injection.  You are sexually active with a partner who has HIV. Talk with your health care provider about whether you are at high risk of being infected with HIV. If you choose to begin PrEP, you should first be tested for HIV. You should then be   tested every 3 months for as long as you are taking PrEP.  PREGNANCY   If you are premenopausal and you may become pregnant, ask your health care provider about preconception counseling.  If you may become pregnant, take 400 to 800 micrograms (mcg) of folic acid every day.  If you want to prevent pregnancy, talk to your health care provider about birth control (contraception). OSTEOPOROSIS AND MENOPAUSE   Osteoporosis is a disease in which the bones lose minerals and strength with aging. This can result in serious bone fractures. Your risk for osteoporosis can be identified using a bone density scan.  If you are 43 years of age or older, or if you are at risk for osteoporosis and fractures, ask your health care provider if you should be screened.  Ask your health care provider whether you should take a calcium or vitamin D supplement to lower your risk for osteoporosis.  Menopause may have certain physical symptoms and risks.  Hormone replacement therapy may reduce some of these symptoms and risks. Talk to your health care provider about whether hormone replacement therapy is right for you.  HOME CARE INSTRUCTIONS   Schedule regular health, dental, and eye exams.  Stay current with your immunizations.   Do not use any tobacco products including cigarettes, chewing tobacco, or electronic cigarettes.  If you are pregnant, do not drink alcohol.  If you are breastfeeding, limit how much and how often you drink alcohol.  Limit alcohol intake to no more than 1 drink per day for  nonpregnant women. One drink equals 12 ounces of beer, 5 ounces of wine, or 1 ounces of hard liquor.  Do not use street drugs.  Do not share needles.  Ask your health care provider for help if you need support or information about quitting drugs.  Tell your health care provider if you often feel depressed.  Tell your health care provider if you have ever been abused or do not feel safe at home.   This information is not intended to replace advice given to you by your health care provider. Make sure you discuss any questions you have with your health care provider.   Document Released: 01/24/2011 Document Revised: 08/01/2014 Document Reviewed: 06/12/2013 Elsevier Interactive Patient Education [employer].

## 2015-06-15 NOTE — Assessment & Plan Note (Signed)
Continue osphena. Sees GYN

## 2015-06-15 NOTE — Assessment & Plan Note (Signed)
Continue f/u with rheum

## 2015-06-15 NOTE — Assessment & Plan Note (Signed)
Continue adderall XR. 

## 2015-06-15 NOTE — Assessment & Plan Note (Signed)
Reviewed recent dx with patient.

## 2015-06-15 NOTE — Assessment & Plan Note (Signed)
Continue f/u with rheum Dr Estanislado Pandy

## 2015-06-15 NOTE — Assessment & Plan Note (Signed)
Advanced directives: has at home, will bring me copy. HCPOA is husband. 

## 2015-06-15 NOTE — Assessment & Plan Note (Signed)
Continue chronic narcotic therapy.

## 2015-06-15 NOTE — Assessment & Plan Note (Signed)
Recurrent despite cholecystectomy. Continue ursodiol.

## 2015-06-16 ENCOUNTER — Encounter: Payer: Self-pay | Admitting: Family Medicine

## 2015-06-16 ENCOUNTER — Encounter: Payer: Self-pay | Admitting: Obstetrics and Gynecology

## 2015-06-16 ENCOUNTER — Telehealth: Payer: Self-pay

## 2015-06-16 DIAGNOSIS — R6882 Decreased libido: Secondary | ICD-10-CM

## 2015-06-16 NOTE — Telephone Encounter (Signed)
Non-Urgent Medical Question  Message [id number]   From  ZOELLA HAMMAKER   To  Nunzio Cobbs, MD   Sent  06/16/2015 10:42 AM     Dr. Quincy Simmonds, I have been using the [place] now for several months and I don't feel like I am getting any benefit from it. I am still experiencing vaginal dryness and painful intercourse. I believe that I was getting better results while using the Vagifem, I wanted your opinion as to whether to switch back to the Vagifem and discontinue the Osphena. I have an appointment for the third week of December to check my testosterone levels, anxious to see what they are as I don't feel like I am getting very good results anymore. Thank you very much for your time and attention to this matter. Have a Happy Thanksgiving!  Juliann Pulse      Responsible Party    Pool - [facility] Pool No one has taken responsibility for this message.     No actions have been taken on this message.

## 2015-06-16 NOTE — Telephone Encounter (Signed)
Ok to stop Osphena and restart Vagifem 10 mcg pv three times per week.  Dispense:  12 tabs, RF 6. Ok to to do a recheck of her testosterone level at the beginning of December so we can make adjustments sooner if needed. I would like to test for free testosterone, total testosterone, and sex hormone binding globulin with that blood draw.

## 2015-06-16 NOTE — Telephone Encounter (Signed)
Routing to Dr.Silva for review and advise of mychart message as seen below.

## 2015-06-17 ENCOUNTER — Telehealth: Payer: Self-pay

## 2015-06-17 MED ORDER — ESTRADIOL 10 MCG VA TABS: 10.0000 ug | ORAL_TABLET | VAGINAL | Status: DC

## 2015-06-17 NOTE — Telephone Encounter (Signed)
Spoke with patient. Advised of message as seen below from [place]. Patient is agreeable and verbalizes understanding. Rx for Vagifem 10 mcg three times per week #12 RF6 sent to [facility] pharmacy on file per patient's request. Patient has a lab appointment scheduled for 07/09/2015 will keep this appointment as scheduled. Orders placed.  Routing to provider for final review. Patient agreeable to disposition. Will close encounter.

## 2015-06-17 NOTE — Telephone Encounter (Signed)
Please see telephone encounter date 06/16/2015 with recommendations from [place] regarding mychart message. Will close encounter.

## 2015-07-07 ENCOUNTER — Encounter: Payer: Self-pay | Admitting: Family Medicine

## 2015-07-09 ENCOUNTER — Other Ambulatory Visit (INDEPENDENT_AMBULATORY_CARE_PROVIDER_SITE_OTHER): Payer: Medicare Other

## 2015-07-09 ENCOUNTER — Encounter: Payer: Self-pay | Admitting: Family Medicine

## 2015-07-09 DIAGNOSIS — R6882 Decreased libido: Secondary | ICD-10-CM

## 2015-07-09 LAB — TESTOSTERONE: Testosterone: 50 ng/dL (ref 10–70)

## 2015-07-09 NOTE — Telephone Encounter (Signed)
Ok to refill? Will need written Rx for #90 per Mychart message.

## 2015-07-10 LAB — SEX HORMONE BINDING GLOBULIN: SEX HORMONE BINDING: 95 nmol/L — AB (ref 14–73)

## 2015-07-10 LAB — TESTOSTERONE, FREE: Testosterone, Free: 4.3 pg/mL (ref 0.6–6.8)

## 2015-07-10 MED ORDER — ESZOPICLONE 3 MG PO TABS: 3.0000 mg | ORAL_TABLET | Freq: Every evening | ORAL | Status: DC | PRN

## 2015-07-10 NOTE — Telephone Encounter (Signed)
Printed and in Kim's box 

## 2015-07-12 ENCOUNTER — Encounter: Payer: Self-pay | Admitting: Obstetrics and Gynecology

## 2015-07-13 ENCOUNTER — Telehealth: Payer: Self-pay

## 2015-07-13 NOTE — Telephone Encounter (Signed)
Non-Urgent Medical Question  Message [id number]   From  Rachel Duran   To  Nunzio Cobbs, MD   Sent  07/12/2015 3:01 PM     Dr. Quincy Simmonds I received your note to increase using the testosterone to once per day, seven days a week. About maybe 4 weeks ago or longer, whenever I sent the last note to you, your nurse called and advised me to increase my dosage at that time to 7 days, so I have been using the testosterone once a day for 7 days already for about 4-6 weeks. In light of that information, what would you have me do? Thank you so very much for your time and attention to this matter, I really appreciate all you do for me.   Thanks,  Rachel Duran      Responsible Party    Pool - [facility] Pool No one has taken responsibility for this message.     No actions have been taken on this message.

## 2015-07-13 NOTE — Telephone Encounter (Signed)
Telephone encounter created to discuss with Dr.Silva.

## 2015-07-13 NOTE — Telephone Encounter (Signed)
Spoke with patient and notified Rx was ready. Placed up front for pick up.

## 2015-07-13 NOTE — Telephone Encounter (Signed)
Routing my chart message as seen below to Dr.Silva for review and advise. 

## 2015-07-13 NOTE — Telephone Encounter (Signed)
Pt left v/m requesting cb ASAP [phone].

## 2015-07-13 NOTE — Telephone Encounter (Signed)
Response sent directly to patient through My Chart.  Encounter is closed.

## 2015-07-16 ENCOUNTER — Other Ambulatory Visit (INDEPENDENT_AMBULATORY_CARE_PROVIDER_SITE_OTHER): Payer: Medicare Other

## 2015-07-16 ENCOUNTER — Other Ambulatory Visit: Payer: Self-pay | Admitting: Family Medicine

## 2015-07-16 DIAGNOSIS — D638 Anemia in other chronic diseases classified elsewhere: Secondary | ICD-10-CM

## 2015-07-16 DIAGNOSIS — Z1159 Encounter for screening for other viral diseases: Secondary | ICD-10-CM

## 2015-07-16 LAB — CBC WITH DIFFERENTIAL/PLATELET
BASOS ABS: 0 10*3/uL (ref 0.0–0.1)
Basophils Relative: 0.5 % (ref 0.0–3.0)
EOS PCT: 2.1 % (ref 0.0–5.0)
Eosinophils Absolute: 0.2 10*3/uL (ref 0.0–0.7)
HCT: 37.4 % (ref 36.0–46.0)
HEMOGLOBIN: 12 g/dL (ref 12.0–15.0)
Lymphocytes Relative: 39.4 % (ref 12.0–46.0)
Lymphs Abs: 3.1 10*3/uL (ref 0.7–4.0)
MCHC: 32.2 g/dL (ref 30.0–36.0)
MCV: 87.5 fl (ref 78.0–100.0)
MONO ABS: 0.5 10*3/uL (ref 0.1–1.0)
MONOS PCT: 5.8 % (ref 3.0–12.0)
NEUTROS PCT: 52.2 % (ref 43.0–77.0)
Neutro Abs: 4.1 10*3/uL (ref 1.4–7.7)
Platelets: 275 10*3/uL (ref 150.0–400.0)
RBC: 4.28 Mil/uL (ref 3.87–5.11)
RDW: 14.4 % (ref 11.5–15.5)
WBC: 7.9 10*3/uL (ref 4.0–10.5)

## 2015-07-16 LAB — VITAMIN B12: VITAMIN B 12: 1096 pg/mL — AB (ref 211–911)

## 2015-07-16 LAB — IBC PANEL
Iron: 48 ug/dL (ref 42–145)
SATURATION RATIOS: 12.2 % — AB (ref 20.0–50.0)
TRANSFERRIN: 280 mg/dL (ref 212.0–360.0)

## 2015-07-16 LAB — FERRITIN: FERRITIN: 25.5 ng/mL (ref 10.0–291.0)

## 2015-07-16 LAB — FOLATE: Folate: >23.8 ng/mL (ref >5.9)

## 2015-07-17 ENCOUNTER — Other Ambulatory Visit: Payer: Self-pay | Admitting: Family Medicine

## 2015-07-17 ENCOUNTER — Encounter: Payer: Self-pay | Admitting: Family Medicine

## 2015-07-17 LAB — HEPATITIS C ANTIBODY: HCV AB: NEGATIVE

## 2015-07-17 MED ORDER — FERROUS SULFATE 325 (65 FE) MG PO TABS: 325.0000 mg | ORAL_TABLET | Freq: Every day | ORAL | Status: DC

## 2015-07-27 ENCOUNTER — Encounter: Payer: Self-pay | Admitting: Family Medicine

## 2015-07-30 ENCOUNTER — Encounter: Payer: Self-pay | Admitting: Family Medicine

## 2015-07-30 ENCOUNTER — Ambulatory Visit (INDEPENDENT_AMBULATORY_CARE_PROVIDER_SITE_OTHER): Payer: Medicare Other | Admitting: Family Medicine

## 2015-07-30 VITALS — BP 114/80 | HR 98 | Temp 98.2°F | Wt 131.5 lb

## 2015-07-30 DIAGNOSIS — R0981 Nasal congestion: Secondary | ICD-10-CM | POA: Insufficient documentation

## 2015-07-30 DIAGNOSIS — J019 Acute sinusitis, unspecified: Secondary | ICD-10-CM

## 2015-07-30 DIAGNOSIS — E611 Iron deficiency: Secondary | ICD-10-CM | POA: Insufficient documentation

## 2015-07-30 DIAGNOSIS — B9689 Other specified bacterial agents as the cause of diseases classified elsewhere: Secondary | ICD-10-CM

## 2015-07-30 DIAGNOSIS — G4700 Insomnia, unspecified: Secondary | ICD-10-CM

## 2015-07-30 MED ORDER — AMOXICILLIN-POT CLAVULANATE 875-125 MG PO TABS: 1.0000 | ORAL_TABLET | Freq: Two times a day (BID) | ORAL | Status: AC

## 2015-07-30 NOTE — Assessment & Plan Note (Addendum)
Treat as bacterial sinusitis given duration of sxs with augmentin antibiotic course. See pt instructions for further supportive care. Update if not improving with treatment

## 2015-07-30 NOTE — Patient Instructions (Addendum)
You have a sinus infection. Take medicine as prescribed: augmentin antibiotic twice daily for 10 days Push fluids and plenty of rest. Nasal saline irrigation or neti pot to help drain sinuses. Please let us know if fever >101.5, trouble opening/closing mouth, difficulty swallowing, or worsening instead of improving as expected.  Take iron tablets daily for 3 months then return for lab visit.

## 2015-07-30 NOTE — Assessment & Plan Note (Signed)
Anticipate contributes to chronic fatigue. Under treatment with iron tablet daily and tolerating well. Will recheck iron panel in 3 months.

## 2015-07-30 NOTE — Progress Notes (Signed)
Pre visit review using our clinic review tool, if applicable. No additional management support is needed unless otherwise documented below in the visit note. 

## 2015-07-30 NOTE — Progress Notes (Signed)
BP 114/80 mmHg  Pulse 98  Temp(Src) 98.2 F (36.8 C) (Oral)  Wt 131 lb 8 oz (59.648 kg)  SpO2 98%  LMP 07/25/1998   CC: sick x3 wks  Subjective:    Patient ID: Rachel Duran, female    DOB: 1955/09/26, 60 y.o.   MRN: [id number]  HPI: [name] is a 60 y.o. female presenting on 07/30/2015 for Sinusitis   3 wk h/o rhinorrhea, congestion, purulent nasal discharge, ST, has lost sense of taste and smell, some residual ear pain, sinus headache.   No cough, fevers/chills, dyspnea.   Has been treating with salt water gargles. Using flonase and nasal saline. Used OTC cold remedy.  + sick contacts at home. No h/o asthma. No smokers at home.   H/o 5 sinus surgeries.  Other questions: Iron - tolerating well ferrous sulfate with meal. Started 07/17/2015.  lunesta - manufacturer back order. ambien causes memory fog if taken for more than 6-8 months. Would try ambien if lunesta not available.   Relevant past medical, surgical, family and social history reviewed and updated as indicated. Interim medical history since our last visit reviewed. Allergies and medications reviewed and updated. Current Outpatient Prescriptions on File Prior to Visit  Medication Sig  . acyclovir (ZOVIRAX) 800 MG tablet Take one-half tablet by  mouth every day  . ALPRAZolam (XANAX) 1 MG tablet Take 1 tablet (1 mg total) by mouth 3 (three) times daily as needed for anxiety. With 1/2 tab at night time  . amLODipine (NORVASC) 5 MG tablet Take 1 tablet (5 mg total) by mouth daily. For [name]  . amphetamine-dextroamphetamine (ADDERALL XR) 30 MG 24 hr capsule Take 1 capsule (30 mg total) by mouth every morning.  . ARIPiprazole (ABILIFY) 10 MG tablet Take 5 mg by mouth at bedtime.   Marland Kitchen aspirin EC 81 MG tablet Take 81 mg by mouth every other day.   . baclofen (LIORESAL) 10 MG tablet Take 1 tablet (10 mg total) by mouth 3 (three) times daily as needed.  . Calcium-Magnesium-Vitamin D [id number] MG-MG-UNIT TB24  Take 1 tablet by mouth 2 (two) times daily.  Marland Kitchen co-enzyme Q-10 50 MG capsule Take 50 mg by mouth daily.  . cyanocobalamin (,VITAMIN B-12,) 1000 MCG/ML injection Inject 1 mL (1,000 mcg total) into the muscle every 21 ( twenty-one) days.  . diclofenac sodium (VOLTAREN) 1 % GEL Apply 1 g topically 2 (two) times daily as needed (pain).   Marland Kitchen esomeprazole (NEXIUM) 40 MG capsule Take 1 capsule (40 mg total) by mouth 2 (two) times daily.  . Estradiol 10 MCG TABS vaginal tablet Place 1 tablet (10 mcg total) vaginally 3 (three) times a week.  . Eszopiclone 3 MG TABS Take 1 tablet (3 mg total) by mouth at bedtime as needed. Take immediately before bedtime  . ferrous sulfate 325 (65 FE) MG tablet Take 1 tablet (325 mg total) by mouth daily with breakfast.  . fluticasone (FLONASE) 50 MCG/ACT nasal spray Place 2 sprays into both nostrils daily.  . hydrocortisone (ANUSOL-HC) 25 MG suppository Place 25 mg rectally 2 (two) times daily as needed for hemorrhoids or itching.  . lamoTRIgine (LAMICTAL) 150 MG tablet Take 150 mg by mouth daily.   Marland Kitchen LINZESS 290 MCG CAPS capsule TAKE 1 CAPSULE EVERY DAY  . MAGNESIUM PO Take 1,000 mg by mouth 3 (three) times daily.  . Manganese 50 MG TABS Take 50 mg by mouth daily.   . Morphine-Naltrexone 80-3.2 MG CPCR Take 1 tablet by  mouth daily.  . Multiple Vitamins-Minerals (ICAPS AREDS 2) CAPS Take 1 capsule by mouth daily.  . NONFORMULARY OR COMPOUNDED ITEM Testosterone propionate 2% in white petrolatum, apply daily for 5 days per week.  Dispense:  60 grams.  . ondansetron (ZOFRAN) 4 MG tablet Take 1 tablet (4 mg total) by mouth every 8 (eight) hours as needed for nausea or vomiting.  Marland Kitchen oxyCODONE-acetaminophen (ROXICET) 5-325 MG tablet Take 1 tablet by mouth every 8 (eight) hours as needed for severe pain.  . pentosan polysulfate (ELMIRON) 100 MG capsule Take 200 mg by mouth 2 (two) times daily.   . Polyethylene Glycol 3350 (MIRALAX PO) Take 17 g by mouth 4 (four) times daily.   .  pregabalin (LYRICA) 150 MG capsule Take 150 mg by mouth 3 (three) times daily.   . ranitidine (ZANTAC) 150 MG tablet Take 150 mg by mouth at bedtime as needed for heartburn.  . thiamine 100 MG tablet Take 100 mg by mouth daily.  . traMADol (ULTRAM) 50 MG tablet Take 1 tablet (50 mg total) by mouth 2 (two) times daily as needed.  . Triamcinolone Acetonide (NASACORT AQ NA) Place 2 sprays into the nose daily as needed (allergies).   . ursodiol (ACTIGALL) 300 MG capsule TAKE ONE TABLET BY MOUTH TWICE DAILY   No current facility-administered medications on file prior to visit.    Review of Systems Per HPI unless specifically indicated in ROS section     Objective:    BP 114/80 mmHg  Pulse 98  Temp(Src) 98.2 F (36.8 C) (Oral)  Wt 131 lb 8 oz (59.648 kg)  SpO2 98%  LMP 07/25/1998  Wt Readings from Last 3 Encounters:  07/30/15 131 lb 8 oz (59.648 kg)  06/15/15 129 lb 8 oz (58.741 kg)  04/16/15 131 lb 12 oz (59.761 kg)    Physical Exam  Constitutional: She appears well-developed and well-nourished. No distress.  HENT:  Head: Normocephalic and atraumatic.  Right Ear: Hearing, tympanic membrane, external ear and ear canal normal.  Left Ear: Hearing, tympanic membrane, external ear and ear canal normal.  Nose: Mucosal edema (L>R congestion) present. No rhinorrhea. Right sinus exhibits maxillary sinus tenderness and frontal sinus tenderness. Left sinus exhibits maxillary sinus tenderness and frontal sinus tenderness.  Mouth/Throat: Uvula is midline and mucous membranes are normal. Posterior oropharyngeal erythema present. No oropharyngeal exudate, posterior oropharyngeal edema or tonsillar abscesses.  Eyes: Conjunctivae and EOM are normal. Pupils are equal, round, and reactive to light. No scleral icterus.  Neck: Normal range of motion. Neck supple.  Cardiovascular: Normal rate, regular rhythm, normal heart sounds and intact distal pulses.   No murmur heard. Pulmonary/Chest: Effort normal  and breath sounds normal. No respiratory distress. She has no wheezes. She has no rales.  Lymphadenopathy:    She has no cervical adenopathy.  Skin: Skin is warm and dry. No rash noted.  Nursing note and vitals reviewed.  Results for orders placed or performed in visit on 07/16/15  CBC with Differential/Platelet  Result Value Ref Range   WBC 7.9 4.0 - 10.5 K/uL   RBC 4.28 3.87 - 5.11 Mil/uL   Hemoglobin 12.0 12.0 - 15.0 g/dL   HCT 37.4 36.0 - 46.0 %   MCV 87.5 78.0 - 100.0 fl   MCHC 32.2 30.0 - 36.0 g/dL   RDW 14.4 11.5 - 15.5 %   Platelets 275.0 150.0 - 400.0 K/uL   Neutrophils Relative % 52.2 43.0 - 77.0 %   Lymphocytes Relative 39.4 12.0 -  46.0 %   Monocytes Relative 5.8 3.0 - 12.0 %   Eosinophils Relative 2.1 0.0 - 5.0 %   Basophils Relative 0.5 0.0 - 3.0 %   Neutro Abs 4.1 1.4 - 7.7 K/uL   Lymphs Abs 3.1 0.7 - 4.0 K/uL   Monocytes Absolute 0.5 0.1 - 1.0 K/uL   Eosinophils Absolute 0.2 0.0 - 0.7 K/uL   Basophils Absolute 0.0 0.0 - 0.1 K/uL  Ferritin  Result Value Ref Range   Ferritin 25.5 10.0 - 291.0 ng/mL  IBC panel  Result Value Ref Range   Iron 48 42 - 145 ug/dL   Transferrin 280.0 212.0 - 360.0 mg/dL   Saturation Ratios 12.2 (L) 20.0 - 50.0 %  Vitamin B12  Result Value Ref Range   Vitamin B-12 1096 (H) 211 - 911 pg/mL  Folate  Result Value Ref Range   Folate >23.8 >5.9 ng/mL      Assessment & Plan:   Problem List Items Addressed This Visit    Iron deficiency    Anticipate contributes to chronic fatigue. Under treatment with iron tablet daily and tolerating well. Will recheck iron panel in 3 months.      INSOMNIA, CHRONIC    Generic lunesta on manufacturer back order. May try brand vs return to [place]. Has tried and failed several other medications.      Acute bacterial sinusitis - Primary    Treat as bacterial sinusitis given duration of sxs with augmentin antibiotic course. See pt instructions for further supportive care. Update if not improving with  treatment      Relevant Medications   amoxicillin-clavulanate (AUGMENTIN) 875-125 MG tablet       Follow up plan: Return if symptoms worsen or fail to improve.

## 2015-07-30 NOTE — Assessment & Plan Note (Signed)
Generic lunesta on manufacturer back order. May try brand vs return to [place]. Has tried and failed several other medications.

## 2015-08-21 ENCOUNTER — Encounter: Payer: Self-pay | Admitting: Family Medicine

## 2015-08-21 ENCOUNTER — Ambulatory Visit (INDEPENDENT_AMBULATORY_CARE_PROVIDER_SITE_OTHER): Payer: 59 | Admitting: Family Medicine

## 2015-08-21 VITALS — BP 122/72 | HR 100 | Temp 98.3°F | Wt 134.8 lb

## 2015-08-21 DIAGNOSIS — R0981 Nasal congestion: Secondary | ICD-10-CM | POA: Diagnosis not present

## 2015-08-21 DIAGNOSIS — M79651 Pain in right thigh: Secondary | ICD-10-CM | POA: Diagnosis not present

## 2015-08-21 MED ORDER — POTASSIUM GLUCONATE 2 MEQ PO TABS: 1.0000 | ORAL_TABLET | Freq: Every day | ORAL | Status: DC

## 2015-08-21 MED ORDER — PREDNISONE 20 MG PO TABS: ORAL_TABLET | ORAL | Status: DC

## 2015-08-21 NOTE — Assessment & Plan Note (Signed)
Described as charlie horse sensation - anticipate cramping related.  Recommended decrease nexium to 40mg  once daily, start potassium supplement OTC 1 pill daily. Update with effect. Pt agrees.

## 2015-08-21 NOTE — Progress Notes (Signed)
BP 122/72 mmHg  Pulse 100  Temp(Src) 98.3 F (36.8 C) (Oral)  Wt 134 lb 12 oz (61.122 kg)  LMP 07/25/1998   CC: f/u visit  Subjective:    Patient ID: Rachel Duran, female    DOB: [birth year], 58 y.o.   MRN: [id number]  HPI: Rachel Duran is a 60 y.o. female presenting on 08/21/2015 for Follow-up; Sinusitis; and Spasms   Seen 07/30/2015 with dx sinusitis - treated with 10d augmentin course. Sinus congestion cleared up with antibiotics but notices persistent loss of taste/smell (ongoing for 1 month) despite flonase and saline. Mild clear drainage present. H/o remote sinus surgery x5. No local ENT.   R anterior thigh pain from knee to hip ongoing for last 2 months. Occurs whenever prolonged on her feet. Described as charlie horse sensation that lasts 15-16min. Has been given stretches by PT.   Chronic pain - see prior note and emails. This month we trialed nucynta but it caused agitation and uncontrollable jerking reflexes of arms and legs, as well as insomnia. We have returned to prior Embeda dose. She is also undergoing physical therapy for bursitis. Feels she's doing well with this  Relevant past medical, surgical, family and social history reviewed and updated as indicated. Interim medical history since our last visit reviewed. Allergies and medications reviewed and updated. Current Outpatient Prescriptions on File Prior to Visit  Medication Sig  . acyclovir (ZOVIRAX) 800 MG tablet Take one-half tablet by  mouth every day  . ALPRAZolam (XANAX) 1 MG tablet Take 1 tablet (1 mg total) by mouth 3 (three) times daily as needed for anxiety. With 1/2 tab at night time  . amLODipine (NORVASC) 5 MG tablet Take 1 tablet (5 mg total) by mouth daily. For [name]  . amphetamine-dextroamphetamine (ADDERALL XR) 30 MG 24 hr capsule Take 1 capsule (30 mg total) by mouth every morning.  . ARIPiprazole (ABILIFY) 10 MG tablet Take 5 mg by mouth at bedtime.   Marland Kitchen aspirin EC 81 MG tablet Take 81 mg  by mouth every other day.   . baclofen (LIORESAL) 10 MG tablet Take 1 tablet (10 mg total) by mouth 3 (three) times daily as needed.  . Calcium-Magnesium-Vitamin D [id number] MG-MG-UNIT TB24 Take 1 tablet by mouth 2 (two) times daily.  Marland Kitchen co-enzyme Q-10 50 MG capsule Take 50 mg by mouth daily.  . cyanocobalamin (,VITAMIN B-12,) 1000 MCG/ML injection Inject 1 mL (1,000 mcg total) into the muscle every 21 ( twenty-one) days.  . diclofenac sodium (VOLTAREN) 1 % GEL Apply 1 g topically 2 (two) times daily as needed (pain).   Marland Kitchen esomeprazole (NEXIUM) 40 MG capsule Take 1 capsule (40 mg total) by mouth 2 (two) times daily.  . Estradiol 10 MCG TABS vaginal tablet Place 1 tablet (10 mcg total) vaginally 3 (three) times a week.  . Eszopiclone 3 MG TABS Take 1 tablet (3 mg total) by mouth at bedtime as needed. Take immediately before bedtime  . ferrous sulfate 325 (65 FE) MG tablet Take 1 tablet (325 mg total) by mouth daily with breakfast.  . fluticasone (FLONASE) 50 MCG/ACT nasal spray Place 2 sprays into both nostrils daily.  . hydrocortisone (ANUSOL-HC) 25 MG suppository Place 25 mg rectally 2 (two) times daily as needed for hemorrhoids or itching.  . lamoTRIgine (LAMICTAL) 150 MG tablet Take 150 mg by mouth daily.   Marland Kitchen LINZESS 290 MCG CAPS capsule TAKE 1 CAPSULE EVERY DAY  . MAGNESIUM PO Take 1,000 mg by mouth 3 (  three) times daily.  . Manganese 50 MG TABS Take 50 mg by mouth daily.   . Morphine-Naltrexone 80-3.2 MG CPCR Take 1 tablet by mouth daily.  . Multiple Vitamins-Minerals (ICAPS AREDS 2) CAPS Take 1 capsule by mouth daily.  . NONFORMULARY OR COMPOUNDED ITEM Testosterone propionate 2% in white petrolatum, apply daily for 5 days per week.  Dispense:  60 grams.  . ondansetron (ZOFRAN) 4 MG tablet Take 1 tablet (4 mg total) by mouth every 8 (eight) hours as needed for nausea or vomiting.  Marland Kitchen oxyCODONE-acetaminophen (ROXICET) 5-325 MG tablet Take 1 tablet by mouth every 8 (eight) hours as needed for  severe pain.  . pentosan polysulfate (ELMIRON) 100 MG capsule Take 200 mg by mouth 2 (two) times daily.   . Polyethylene Glycol 3350 (MIRALAX PO) Take 17 g by mouth 4 (four) times daily.   . pregabalin (LYRICA) 150 MG capsule Take 150 mg by mouth 3 (three) times daily.   . ranitidine (ZANTAC) 150 MG tablet Take 150 mg by mouth at bedtime as needed for heartburn.  . thiamine 100 MG tablet Take 100 mg by mouth daily.  . traMADol (ULTRAM) 50 MG tablet Take 1 tablet (50 mg total) by mouth 2 (two) times daily as needed.  . Triamcinolone Acetonide (NASACORT AQ NA) Place 2 sprays into the nose daily as needed (allergies).   . ursodiol (ACTIGALL) 300 MG capsule TAKE ONE TABLET BY MOUTH TWICE DAILY   No current facility-administered medications on file prior to visit.    Review of Systems Per HPI unless specifically indicated in ROS section     Objective:    BP 122/72 mmHg  Pulse 100  Temp(Src) 98.3 F (36.8 C) (Oral)  Wt 134 lb 12 oz (61.122 kg)  LMP 07/25/1998  Wt Readings from Last 3 Encounters:  08/21/15 134 lb 12 oz (61.122 kg)  07/30/15 131 lb 8 oz (59.648 kg)  06/15/15 129 lb 8 oz (58.741 kg)    Physical Exam  Constitutional: She appears well-developed and well-nourished. No distress.  HENT:  Head: Normocephalic and atraumatic.  Right Ear: Hearing, tympanic membrane, external ear and ear canal normal.  Left Ear: Hearing, tympanic membrane, external ear and ear canal normal.  Nose: Mucosal edema present. No rhinorrhea. Right sinus exhibits no maxillary sinus tenderness and no frontal sinus tenderness. Left sinus exhibits no maxillary sinus tenderness and no frontal sinus tenderness.  Mouth/Throat: Uvula is midline, oropharynx is clear and moist and mucous membranes are normal. No oropharyngeal exudate, posterior oropharyngeal edema, posterior oropharyngeal erythema or tonsillar abscesses.  Mucous present bilaterally ?polyp tissue on right  Eyes: Conjunctivae and EOM are normal.  Pupils are equal, round, and reactive to light. No scleral icterus.  Neck: Normal range of motion. Neck supple. No thyromegaly present.  Pulmonary/Chest: Breath sounds normal.  Musculoskeletal: She exhibits no edema.  No pain to palpation R anterior thigh FROM at R knee with marked crepitus Neg SLR on right No pain with int/ext rotation at right hip. No pain with FABER test. No pain with palpation of bilateral SIJ, sciatic notch or GTB.  No palpable cords  Lymphadenopathy:    She has no cervical adenopathy.  Skin: Skin is warm and dry. No rash noted.  Nursing note and vitals reviewed.  Results for orders placed or performed in visit on 07/16/15  CBC with Differential/Platelet  Result Value Ref Range   WBC 7.9 4.0 - 10.5 K/uL   RBC 4.28 3.87 - 5.11 Mil/uL   Hemoglobin  12.0 12.0 - 15.0 g/dL   HCT 37.4 36.0 - 46.0 %   MCV 87.5 78.0 - 100.0 fl   MCHC 32.2 30.0 - 36.0 g/dL   RDW 14.4 11.5 - 15.5 %   Platelets 275.0 150.0 - 400.0 K/uL   Neutrophils Relative % 52.2 43.0 - 77.0 %   Lymphocytes Relative 39.4 12.0 - 46.0 %   Monocytes Relative 5.8 3.0 - 12.0 %   Eosinophils Relative 2.1 0.0 - 5.0 %   Basophils Relative 0.5 0.0 - 3.0 %   Neutro Abs 4.1 1.4 - 7.7 K/uL   Lymphs Abs 3.1 0.7 - 4.0 K/uL   Monocytes Absolute 0.5 0.1 - 1.0 K/uL   Eosinophils Absolute 0.2 0.0 - 0.7 K/uL   Basophils Absolute 0.0 0.0 - 0.1 K/uL  Ferritin  Result Value Ref Range   Ferritin 25.5 10.0 - 291.0 ng/mL  IBC panel  Result Value Ref Range   Iron 48 42 - 145 ug/dL   Transferrin 280.0 212.0 - 360.0 mg/dL   Saturation Ratios 12.2 (L) 20.0 - 50.0 %  Vitamin B12  Result Value Ref Range   Vitamin B-12 1096 (H) 211 - 911 pg/mL  Folate  Result Value Ref Range   Folate >23.8 >5.9 ng/mL   Lab Results  Component Value Date   CREATININE 0.59 06/08/2015       Assessment & Plan:  Over 25 minutes were spent face-to-face with the patient during this encounter and >50% of that time was spent on counseling  and coordination of care  Problem List Items Addressed This Visit    Sinus congestion - Primary    Sinusitis resolved. Minimal persistent congestion, associated with persistent anosmia and ageusia. Will treat with short oral prednisone course - if not improved consider return to ENT for further eval given extensive nasal sinus surgery history. Pt agrees.      Pain of right thigh    Described as charlie horse sensation - anticipate cramping related.  Recommended decrease nexium to 40mg  once daily, start potassium supplement OTC 1 pill daily. Update with effect. Pt agrees.          Follow up plan: Return if symptoms worsen or fail to improve.

## 2015-08-21 NOTE — Patient Instructions (Addendum)
For sinuses - take oral prednisone course. Let me know if not better for ENT referral. For right thigh cramp - decrease nexium to 1 tablet daily, consider starting potassium supplement over the counter 1 pill daily.  If not better, let me know.

## 2015-08-21 NOTE — Assessment & Plan Note (Addendum)
Sinusitis resolved. Minimal persistent congestion, associated with persistent anosmia and ageusia. Will treat with short oral prednisone course - if not improved consider return to ENT for further eval given extensive nasal sinus surgery history. Pt agrees.

## 2015-08-21 NOTE — Progress Notes (Signed)
Pre visit review using our clinic review tool, if applicable. No additional management support is needed unless otherwise documented below in the visit note. 

## 2015-08-30 ENCOUNTER — Encounter: Payer: Self-pay | Admitting: Family Medicine

## 2015-08-30 ENCOUNTER — Other Ambulatory Visit: Payer: Self-pay | Admitting: Family Medicine

## 2015-08-30 DIAGNOSIS — R0981 Nasal congestion: Secondary | ICD-10-CM

## 2015-08-30 DIAGNOSIS — R519 Headache, unspecified: Secondary | ICD-10-CM

## 2015-08-30 DIAGNOSIS — R51 Headache: Principal | ICD-10-CM

## 2015-08-30 NOTE — Telephone Encounter (Signed)
ENT referral placed.  Will print stimulant Rx for pt to pick up this week.

## 2015-08-31 ENCOUNTER — Other Ambulatory Visit: Payer: Self-pay | Admitting: Family Medicine

## 2015-08-31 MED ORDER — AMPHETAMINE-DEXTROAMPHET ER 30 MG PO CP24: 30.0000 mg | ORAL_CAPSULE | ORAL | Status: DC

## 2015-08-31 NOTE — Telephone Encounter (Signed)
Rx given to Arielle to give to patient.

## 2015-09-01 ENCOUNTER — Telehealth: Payer: Self-pay | Admitting: Gastroenterology

## 2015-09-01 MED ORDER — ESOMEPRAZOLE MAGNESIUM 40 MG PO CPDR: 40.0000 mg | DELAYED_RELEASE_CAPSULE | Freq: Two times a day (BID) | ORAL | Status: DC

## 2015-09-01 NOTE — Telephone Encounter (Signed)
Dr Redmond Baseman is fine. Thank you.

## 2015-09-01 NOTE — Telephone Encounter (Signed)
Dr. Danise Mina, Please see message from Ms. Selinda Eon. Do you recommend anyone specific?  Ebony Hail

## 2015-09-01 NOTE — Telephone Encounter (Signed)
Patient states she would like a 90 day supply sent to [facility] instead of [facility]. Informed patient that I will send her a 90 day supply of Nexium but she is due for a follow up visit in March. Patient scheduled appt for 09/29/15 at 10:45am. Prescription sent to [facility] pharmacy.

## 2015-09-07 ENCOUNTER — Encounter: Payer: Self-pay | Admitting: Family Medicine

## 2015-09-08 MED ORDER — MORPHINE-NALTREXONE 80-3.2 MG PO CPCR: 1.0000 | ORAL_CAPSULE | Freq: Every day | ORAL | Status: DC

## 2015-09-08 NOTE — Telephone Encounter (Signed)
Printed script and in [place]' box.

## 2015-09-08 NOTE — Telephone Encounter (Signed)
Rx placed up front for pick up and patient notified via Mychart. 

## 2015-09-21 ENCOUNTER — Other Ambulatory Visit: Payer: Self-pay

## 2015-09-21 DIAGNOSIS — Z1231 Encounter for screening mammogram for malignant neoplasm of breast: Secondary | ICD-10-CM

## 2015-09-23 DIAGNOSIS — M5136 Other intervertebral disc degeneration, lumbar region: Secondary | ICD-10-CM

## 2015-09-23 DIAGNOSIS — M5126 Other intervertebral disc displacement, lumbar region: Secondary | ICD-10-CM | POA: Insufficient documentation

## 2015-09-23 DIAGNOSIS — M51369 Other intervertebral disc degeneration, lumbar region without mention of lumbar back pain or lower extremity pain: Secondary | ICD-10-CM

## 2015-09-23 HISTORY — DX: Other intervertebral disc degeneration, lumbar region: M51.36

## 2015-09-23 HISTORY — DX: Other intervertebral disc degeneration, lumbar region without mention of lumbar back pain or lower extremity pain: M51.369

## 2015-09-29 ENCOUNTER — Ambulatory Visit (INDEPENDENT_AMBULATORY_CARE_PROVIDER_SITE_OTHER): Payer: Medicare Other | Admitting: Gastroenterology

## 2015-09-29 ENCOUNTER — Encounter: Payer: Self-pay | Admitting: Gastroenterology

## 2015-09-29 ENCOUNTER — Other Ambulatory Visit (INDEPENDENT_AMBULATORY_CARE_PROVIDER_SITE_OTHER): Payer: Medicare Other

## 2015-09-29 VITALS — BP 118/76 | HR 80 | Ht 65.0 in | Wt 135.0 lb

## 2015-09-29 DIAGNOSIS — K831 Obstruction of bile duct: Secondary | ICD-10-CM

## 2015-09-29 DIAGNOSIS — Z1211 Encounter for screening for malignant neoplasm of colon: Secondary | ICD-10-CM | POA: Diagnosis not present

## 2015-09-29 DIAGNOSIS — K5900 Constipation, unspecified: Secondary | ICD-10-CM | POA: Diagnosis not present

## 2015-09-29 DIAGNOSIS — K219 Gastro-esophageal reflux disease without esophagitis: Secondary | ICD-10-CM

## 2015-09-29 LAB — IGA: IgA: 100 mg/dL (ref 68–378)

## 2015-09-29 NOTE — Patient Instructions (Addendum)
Your physician has requested that you go to the basement for the following lab work before leaving today:TTG, IGA.   Please have your pharmacy contact us when you need refill of Actigall and Nexium.   It has been recommended to you by your physician that you have a(n) Colonoscopy completed. Per your request, we did not schedule the procedure(s) today. Please contact our office at [phone] should you decide to have the procedure completed.  Thank you for choosing me and [facility].  Pricilla Riffle. Dagoberto Ligas., MD., Marval Regal

## 2015-09-29 NOTE — Progress Notes (Signed)
    History of Present Illness: This is a 60 year old female returning for follow-up of GERD, bile stasis and constipation. She has a history of recurrent iron deficiency. She previously underwent flexible sigmoidoscopy in 2008 but has not had colonoscopy. Constipation is well controlled on daily Linzess and MiraLAX. Her reflux symptoms are controlled on Nexium 40 mg daily. She underwent repeat ERCP in 2015 with extension of her sphincterotomy and removal of bile duct stones. She has had no recurrent abdominal pain.  Current Medications, Allergies, Past Medical History, Past Surgical History, Family History and Social History were reviewed in [employer] record.  Physical Exam: General: Well developed, well nourished, no acute distress Head: Normocephalic and atraumatic Eyes:  sclerae anicteric, EOMI Ears: Normal auditory acuity Mouth: No deformity or lesions Lungs: Clear throughout to auscultation Heart: Regular rate and rhythm; no murmurs, rubs or bruits Abdomen: Soft, non tender and non distended. No masses, hepatosplenomegaly or hernias noted. Normal Bowel sounds Musculoskeletal: Symmetrical with no gross deformities  Pulses:  Normal pulses noted Extremities: No clubbing, cyanosis, edema or deformities noted Neurological: Alert oriented x 4, grossly nonfocal Psychological:  Alert and cooperative. Normal mood and affect  Assessment and Recommendations:  1. 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 Continue ursodiol 300 mg twice a day.  2. GERD. Continue Nexium 40 mg every morning and standard antireflux measures.  3. Recurrent iron deficiency anemia. Check tTG and IgA. Continue iron replacement. Follow up with PCP.   4. Colorectal cancer screening, average risk. Schedule colonoscopy. The risks (including bleeding, perforation, infection, missed lesions, medication reactions and possible hospitalization or surgery if complications occur),  benefits, and alternatives to colonoscopy with possible biopsy and possible polypectomy were discussed with the patient and they consent to proceed.   5. Chronic constipation. Continue Linzess 290 g daily and MiraLAX once or twice daily titrated for adequate bowel movements.

## 2015-09-30 LAB — TISSUE TRANSGLUTAMINASE, IGA: TISSUE TRANSGLUTAMINASE AB, IGA: 1 U/mL (ref <4)

## 2015-10-02 ENCOUNTER — Telehealth: Payer: Self-pay | Admitting: Gastroenterology

## 2015-10-02 NOTE — Telephone Encounter (Signed)
See results note. 

## 2015-10-04 ENCOUNTER — Encounter: Payer: Self-pay | Admitting: Family Medicine

## 2015-10-05 MED ORDER — ESZOPICLONE 3 MG PO TABS: 3.0000 mg | ORAL_TABLET | Freq: Every evening | ORAL | Status: DC | PRN

## 2015-10-05 NOTE — Telephone Encounter (Signed)
Ok to refill 

## 2015-10-05 NOTE — Telephone Encounter (Signed)
Printed and in Kim's box 

## 2015-10-06 NOTE — Telephone Encounter (Signed)
Patient notified and Rx placed up front for pick up. 

## 2015-10-08 ENCOUNTER — Other Ambulatory Visit (INDEPENDENT_AMBULATORY_CARE_PROVIDER_SITE_OTHER): Payer: Medicare Other

## 2015-10-08 ENCOUNTER — Other Ambulatory Visit: Payer: Self-pay | Admitting: Family Medicine

## 2015-10-08 DIAGNOSIS — D638 Anemia in other chronic diseases classified elsewhere: Secondary | ICD-10-CM

## 2015-10-08 DIAGNOSIS — E1169 Type 2 diabetes mellitus with other specified complication: Secondary | ICD-10-CM

## 2015-10-08 LAB — IBC PANEL
IRON: 116 ug/dL (ref 42–145)
SATURATION RATIOS: 23.4 % (ref 20.0–50.0)
TRANSFERRIN: 354 mg/dL (ref 212.0–360.0)

## 2015-10-08 LAB — FERRITIN: FERRITIN: 37.1 ng/mL (ref 10.0–291.0)

## 2015-10-09 ENCOUNTER — Other Ambulatory Visit: Payer: Self-pay

## 2015-10-09 ENCOUNTER — Other Ambulatory Visit: Payer: Self-pay | Admitting: Family Medicine

## 2015-10-09 MED ORDER — FERROUS SULFATE 325 (65 FE) MG PO TABS: 325.0000 mg | ORAL_TABLET | ORAL | Status: DC

## 2015-10-09 MED ORDER — URSODIOL 300 MG PO CAPS: ORAL_CAPSULE | ORAL | Status: DC

## 2015-10-12 ENCOUNTER — Other Ambulatory Visit: Payer: Self-pay | Admitting: *Deleted

## 2015-10-12 MED ORDER — NONFORMULARY OR COMPOUNDED ITEM: Status: DC

## 2015-10-12 NOTE — Telephone Encounter (Signed)
Medication refill request: Testosterone Propionate Petrolatum (Jar) 2% Ointment Last AEX: 12/19/2014 with BS Next AEX: 01/08/16 with BS Last MMG (if hormonal medication request): 10/16/2014 bi-rads 1: negative ; scheduled for 10/22/15 with TBC Refill authorized: Please advise

## 2015-10-12 NOTE — Telephone Encounter (Signed)
Rx printed, Signed by Dr. Quincy Simmonds and faxed to [facility].

## 2015-10-22 ENCOUNTER — Ambulatory Visit
Admission: RE | Admit: 2015-10-22 | Discharge: 2015-10-22 | Disposition: A | Discharge status: Home / Self Care | Payer: Medicare Other | Source: Ambulatory Visit

## 2015-10-22 DIAGNOSIS — Z1231 Encounter for screening mammogram for malignant neoplasm of breast: Secondary | ICD-10-CM

## 2015-10-25 ENCOUNTER — Encounter: Payer: Self-pay | Admitting: Family Medicine

## 2015-11-24 ENCOUNTER — Other Ambulatory Visit: Payer: Self-pay | Admitting: Obstetrics and Gynecology

## 2015-11-24 NOTE — Telephone Encounter (Signed)
Medication refill request: Yuvafem  Last AEX:  12/19/14 Dr. Quincy Simmonds Next AEX: 01/08/16 Dr. Quincy Simmonds  Last MMG (if hormonal medication request): 10/23/15 [id number]:neg Refill authorized: 06/17/15 #12tabs/ 6R. Today please advise.

## 2015-11-30 ENCOUNTER — Encounter: Payer: Self-pay | Admitting: Family Medicine

## 2015-12-01 NOTE — Telephone Encounter (Signed)
Pt called to verify that mychart message had gone through to PCP, pt is leaving to visit new grandchild very early on 12/03/15 and would like to pick up scripts no later than 12/02/15.  Best number to call is [phone]

## 2015-12-02 MED ORDER — MORPHINE-NALTREXONE 80-3.2 MG PO CPCR: 1.0000 | ORAL_CAPSULE | Freq: Every day | ORAL | Status: DC

## 2015-12-02 MED ORDER — AMPHETAMINE-DEXTROAMPHET ER 30 MG PO CP24: 30.0000 mg | ORAL_CAPSULE | ORAL | Status: DC

## 2015-12-02 NOTE — Telephone Encounter (Signed)
Rx's given to Amber to give to patient.

## 2015-12-02 NOTE — Telephone Encounter (Signed)
Printed and in Kim's box 

## 2015-12-09 ENCOUNTER — Other Ambulatory Visit: Payer: Self-pay | Admitting: Neurosurgery

## 2015-12-09 DIAGNOSIS — M5412 Radiculopathy, cervical region: Secondary | ICD-10-CM

## 2015-12-14 ENCOUNTER — Ambulatory Visit (INDEPENDENT_AMBULATORY_CARE_PROVIDER_SITE_OTHER): Payer: Medicare Other | Admitting: Family Medicine

## 2015-12-14 ENCOUNTER — Encounter: Payer: Self-pay | Admitting: Family Medicine

## 2015-12-14 VITALS — BP 114/70 | HR 86 | Temp 98.2°F | Ht 65.0 in | Wt 130.8 lb

## 2015-12-14 DIAGNOSIS — M5010 Cervical disc disorder with radiculopathy, unspecified cervical region: Secondary | ICD-10-CM | POA: Diagnosis not present

## 2015-12-14 DIAGNOSIS — R5382 Chronic fatigue, unspecified: Secondary | ICD-10-CM

## 2015-12-14 DIAGNOSIS — E611 Iron deficiency: Secondary | ICD-10-CM

## 2015-12-14 DIAGNOSIS — G894 Chronic pain syndrome: Secondary | ICD-10-CM

## 2015-12-14 DIAGNOSIS — M797 Fibromyalgia: Secondary | ICD-10-CM

## 2015-12-14 DIAGNOSIS — G4700 Insomnia, unspecified: Secondary | ICD-10-CM

## 2015-12-14 DIAGNOSIS — H3553 Other dystrophies primarily involving the sensory retina: Secondary | ICD-10-CM

## 2015-12-14 DIAGNOSIS — G9332 Myalgic encephalomyelitis/chronic fatigue syndrome: Secondary | ICD-10-CM

## 2015-12-14 DIAGNOSIS — M5126 Other intervertebral disc displacement, lumbar region: Secondary | ICD-10-CM

## 2015-12-14 MED ORDER — TRAMADOL HCL 50 MG PO TABS: 50.0000 mg | ORAL_TABLET | Freq: Two times a day (BID) | ORAL | Status: DC | PRN

## 2015-12-14 NOTE — Progress Notes (Signed)
BP 114/70 mmHg  Pulse 86  Temp(Src) 98.2 F (36.8 C) (Oral)  Ht 5\' 5"  (1.651 m)  Wt 130 lb 12 oz (59.308 kg)  BMI 21.76 kg/m2  LMP 07/25/1998   CC: 6 mo f/u visit   Subjective:    Patient ID: Rachel Duran, female    DOB: 1955/09/29, 60 y.o.   MRN: [id number]  HPI: Rachel Duran is a 60 y.o. female presenting on 12/14/2015 for Follow-up   Ongoing R leg/thigh spasms associated with pain. No improvement with K. Saw Dr Estanislado Pandy who referred to neurosurgery Dr [name] - HNP L4/5. Rec surgery. Now with R arm paresthesias and numbness. Pending MRI this week. Likely will need surgery. H/o C5/6 cervical fusion. Requests ultram refilled.   Chronic pain - see prior note and emails. Nucynta caused agitation and uncontrollable jerking reflexes of arms and legs, as well as insomnia. Back on Embeda.   Iron - tolerating well ferrous sulfate with meal. Started 07/17/2015.   Insomnia - lunesta and 1/2 xanax nightly. ambien causes memory fog if taken for more than 6-8 months. Would want ambien if lunesta not available. Wakes up twice nightly with pain. + daytime somnolence. 1 cup coffee/day.   Relevant past medical, surgical, family and social history reviewed and updated as indicated. Interim medical history since our last visit reviewed. Allergies and medications reviewed and updated. Current Outpatient Prescriptions on File Prior to Visit  Medication Sig  . acyclovir (ZOVIRAX) 800 MG tablet Take one-half tablet by  mouth every day  . ALPRAZolam (XANAX) 1 MG tablet Take 1 tablet (1 mg total) by mouth 3 (three) times daily as needed for anxiety. With 1/2 tab at night time  . amLODipine (NORVASC) 5 MG tablet Take 1 tablet (5 mg total) by mouth daily.  Marland Kitchen amphetamine-dextroamphetamine (ADDERALL XR) 30 MG 24 hr capsule Take 1 capsule (30 mg total) by mouth every morning.  . ARIPiprazole (ABILIFY) 10 MG tablet Take 5 mg by mouth at bedtime.   Marland Kitchen aspirin EC 81 MG tablet Take 81 mg by mouth every  other day.   . Calcium-Magnesium-Vitamin D [id number] MG-MG-UNIT TB24 Take 1 tablet by mouth 2 (two) times daily.  Marland Kitchen co-enzyme Q-10 50 MG capsule Take 50 mg by mouth daily.  . cyanocobalamin (,VITAMIN B-12,) 1000 MCG/ML injection Inject 1 mL (1,000 mcg total) into the muscle every 21 ( twenty-one) days.  . diclofenac sodium (VOLTAREN) 1 % GEL Apply 1 g topically 2 (two) times daily as needed (pain).   . Eszopiclone 3 MG TABS Take 1 tablet (3 mg total) by mouth at bedtime as needed. Take immediately before bedtime  . ferrous sulfate 325 (65 FE) MG tablet Take 1 tablet (325 mg total) by mouth every Monday, Wednesday, and Friday.  . fluticasone (FLONASE) 50 MCG/ACT nasal spray Place 2 sprays into both nostrils daily.  . hydrocortisone (ANUSOL-HC) 25 MG suppository Place 25 mg rectally 2 (two) times daily as needed for hemorrhoids or itching.  . lamoTRIgine (LAMICTAL) 150 MG tablet Take 150 mg by mouth daily.   Marland Kitchen LINZESS 290 MCG CAPS capsule TAKE 1 CAPSULE EVERY DAY  . MAGNESIUM PO Take 1,000 mg by mouth 3 (three) times daily.  . Manganese 50 MG TABS Take 50 mg by mouth daily.   . Morphine-Naltrexone 80-3.2 MG CPCR Take 1 tablet by mouth daily.  . Multiple Vitamins-Minerals (ICAPS AREDS 2) CAPS Take 1 capsule by mouth daily.  . NONFORMULARY OR COMPOUNDED ITEM Testosterone propionate 2% in white  petrolatum, apply once daily.  Dispense:  60 grams.  . ondansetron (ZOFRAN) 4 MG tablet Take 1 tablet (4 mg total) by mouth every 8 (eight) hours as needed for nausea or vomiting.  . pentosan polysulfate (ELMIRON) 100 MG capsule Take 200 mg by mouth 2 (two) times daily.   . Polyethylene Glycol 3350 (MIRALAX PO) Take 17 g by mouth 4 (four) times daily.   . Potassium Gluconate ([employer] POTASSIUM GLUCONATE) 2 MEQ TABS Take 1 tablet (2 mEq total) by mouth daily.  . pregabalin (LYRICA) 150 MG capsule Take 150 mg by mouth 3 (three) times daily.   Marland Kitchen thiamine 100 MG tablet Take 100 mg by mouth daily. Reported on  12/14/2015  . Triamcinolone Acetonide (NASACORT AQ NA) Place 2 sprays into the nose daily as needed (allergies).   . ursodiol (ACTIGALL) 300 MG capsule TAKE ONE TABLET BY MOUTH TWICE DAILY  . YUVAFEM 10 MCG TABS vaginal tablet Insert 1 tablet (10 mcg  total) vaginally 3 (three)  times a week  . oxyCODONE-acetaminophen (ROXICET) 5-325 MG tablet Take 1 tablet by mouth every 8 (eight) hours as needed for severe pain. (Patient not taking: Reported on 12/14/2015)   No current facility-administered medications on file prior to visit.    Review of Systems Per HPI unless specifically indicated in ROS section     Objective:    BP 114/70 mmHg  Pulse 86  Temp(Src) 98.2 F (36.8 C) (Oral)  Ht 5\' 5"  (1.651 m)  Wt 130 lb 12 oz (59.308 kg)  BMI 21.76 kg/m2  LMP 07/25/1998  Wt Readings from Last 3 Encounters:  12/14/15 130 lb 12 oz (59.308 kg)  09/29/15 135 lb (61.236 kg)  08/21/15 134 lb 12 oz (61.122 kg)    Physical Exam  Constitutional: She appears well-developed and well-nourished. No distress.  HENT:  Mouth/Throat: Oropharynx is clear and moist. No oropharyngeal exudate.  Eyes: Conjunctivae and EOM are normal. Pupils are equal, round, and reactive to light. No scleral icterus.  Neck: Normal range of motion. Neck supple. No thyromegaly present.  Cardiovascular: Normal rate, regular rhythm, normal heart sounds and intact distal pulses.   No murmur heard. Pulmonary/Chest: Effort normal and breath sounds normal. No respiratory distress. She has no wheezes. She has no rales.  Musculoskeletal: She exhibits no edema.  Lymphadenopathy:    She has no cervical adenopathy.  Neurological:  5/5 strength BLE  Skin: Skin is warm and dry. No rash noted.  Psychiatric: She has a normal mood and affect.  Nursing note and vitals reviewed.  Results for orders placed or performed in visit on 10/08/15  IBC panel  Result Value Ref Range   Iron 116 42 - 145 ug/dL   Transferrin 354.0 212.0 - 360.0 mg/dL     Saturation Ratios 23.4 20.0 - 50.0 %  Ferritin  Result Value Ref Range   Ferritin 37.1 10.0 - 291.0 ng/mL      Assessment & Plan:   Problem List Items Addressed This Visit    INSOMNIA, CHRONIC - Primary    Longstanding. Continue chronic regimen of lunesta 3mg  + 1/2 xanax pill nightly.  Consider sleep evaluation to help r/o other cause of chronic fatigue and sleep disturbances. Pt will let me know if desires referral to neuro-sleep.       Cervical disc disorder with radiculopathy of cervical region    New R radiculopathy/paresthesias, pending f/u with neurosurgeon after MRI this week.       Fibromyalgia   CFS (chronic fatigue  syndrome)    adderall XR no longer helping - suggested she stop stimulant, consider sleep evaluation.      Chronic pain syndrome    Tramadol refilled. Continue embeda.      Stargardt's disease    Good report from latest ophtho eval.      Iron deficiency    Stop iron until after colonoscopy this summer.      HNP (herniated nucleus pulposus), lumbar    With R radiculopathy (anterior thigh), MRI showing HNP as cause. May need surgery, re-established with neurosurgery.          Follow up plan: Return in about 6 months (around 06/15/2016), or as needed, for annual exam, prior fasting for blood work.  Ria Bush, MD

## 2015-12-14 NOTE — Assessment & Plan Note (Signed)
With R radiculopathy (anterior thigh), MRI showing HNP as cause. May need surgery, re-established with neurosurgery.

## 2015-12-14 NOTE — Assessment & Plan Note (Signed)
Stop iron until after colonoscopy this summer.

## 2015-12-14 NOTE — Assessment & Plan Note (Addendum)
New R radiculopathy/paresthesias, pending f/u with neurosurgeon after MRI this week.

## 2015-12-14 NOTE — Assessment & Plan Note (Signed)
Tramadol refilled. Continue embeda.

## 2015-12-14 NOTE — Progress Notes (Signed)
Pre visit review using our clinic review tool, if applicable. No additional management support is needed unless otherwise documented below in the visit note. 

## 2015-12-14 NOTE — Assessment & Plan Note (Signed)
Longstanding. Continue chronic regimen of lunesta 3mg  + 1/2 xanax pill nightly.  Consider sleep evaluation to help r/o other cause of chronic fatigue and sleep disturbances. Pt will let me know if desires referral to neuro-sleep.

## 2015-12-14 NOTE — Assessment & Plan Note (Signed)
Good report from latest ophtho eval.

## 2015-12-14 NOTE — Assessment & Plan Note (Signed)
adderall XR no longer helping - suggested she stop stimulant, consider sleep evaluation.

## 2015-12-14 NOTE — Patient Instructions (Addendum)
Hold iron for now - levels were better. We will consider restarting after colonoscopy. Continue current medicines.  I will keep an eye out for Dr Adline Mango note and plan.  If adderall isn't helping, we could consider further sleep evaluation with sleep study.  Stop adderall.  Return in 6 mo for physical, sooner as needed.

## 2015-12-17 ENCOUNTER — Ambulatory Visit
Admission: RE | Admit: 2015-12-17 | Discharge: 2015-12-17 | Disposition: A | Discharge status: Home / Self Care | Payer: Medicare Other | Source: Ambulatory Visit | Attending: Neurosurgery | Admitting: Neurosurgery

## 2015-12-17 DIAGNOSIS — M5412 Radiculopathy, cervical region: Secondary | ICD-10-CM

## 2015-12-25 ENCOUNTER — Other Ambulatory Visit: Payer: Self-pay | Admitting: *Deleted

## 2015-12-25 MED ORDER — CYANOCOBALAMIN 1000 MCG/ML IJ SOLN: 1000.0000 ug | INTRAMUSCULAR | Status: DC

## 2016-01-06 ENCOUNTER — Telehealth: Payer: Self-pay | Admitting: Obstetrics and Gynecology

## 2016-01-06 NOTE — Telephone Encounter (Signed)
Patient called and said, "[facility] needs prior authorization on Yuvafem." Patient needs refills as soon as possible for "4 boxes with 8 in each box for 3 months at a time."

## 2016-01-07 NOTE — Telephone Encounter (Signed)
Spoke to [facility] about PA. Pt does not need PA approval as her medication is not exceeding the 30 day supply for Medicare. [facility] corrected Rx and resent through insurance and was approved. Per [place] @ [facility] Order [phone]. Rx is being sent out and pt was advised. -sco

## 2016-01-08 ENCOUNTER — Ambulatory Visit (INDEPENDENT_AMBULATORY_CARE_PROVIDER_SITE_OTHER): Payer: Medicare Other | Admitting: Obstetrics and Gynecology

## 2016-01-08 ENCOUNTER — Encounter: Payer: Self-pay | Admitting: Obstetrics and Gynecology

## 2016-01-08 VITALS — BP 100/70 | HR 74 | Resp 16 | Ht 64.25 in | Wt 130.0 lb

## 2016-01-08 DIAGNOSIS — M8580 Other specified disorders of bone density and structure, unspecified site: Secondary | ICD-10-CM

## 2016-01-08 DIAGNOSIS — N952 Postmenopausal atrophic vaginitis: Secondary | ICD-10-CM

## 2016-01-08 DIAGNOSIS — R300 Dysuria: Secondary | ICD-10-CM

## 2016-01-08 DIAGNOSIS — R6882 Decreased libido: Secondary | ICD-10-CM | POA: Diagnosis not present

## 2016-01-08 DIAGNOSIS — Z01419 Encounter for gynecological examination (general) (routine) without abnormal findings: Secondary | ICD-10-CM | POA: Diagnosis not present

## 2016-01-08 LAB — POCT URINALYSIS DIPSTICK
Bilirubin, UA: NEGATIVE
Blood, UA: NEGATIVE
Glucose, UA: NEGATIVE
Ketones, UA: NEGATIVE
NITRITE UA: NEGATIVE
PROTEIN UA: NEGATIVE
UROBILINOGEN UA: NEGATIVE
pH, UA: 6

## 2016-01-08 MED ORDER — CEPHALEXIN 500 MG PO CAPS: 500.0000 mg | ORAL_CAPSULE | Freq: Four times a day (QID) | ORAL | Status: DC

## 2016-01-08 MED ORDER — ESTRADIOL 10 MCG VA TABS: ORAL_TABLET | VAGINAL | Status: DC

## 2016-01-08 MED ORDER — NONFORMULARY OR COMPOUNDED ITEM: Status: DC

## 2016-01-08 MED ORDER — FLUCONAZOLE 150 MG PO TABS: 150.0000 mg | ORAL_TABLET | Freq: Every day | ORAL | Status: DC

## 2016-01-08 NOTE — Patient Instructions (Signed)
Health Maintenance, Female Adopting a healthy lifestyle and getting preventive care can go a long way to promote health and wellness. Talk with your health care provider about what schedule of regular examinations is right for you. This is a good chance for you to check in with your provider about disease prevention and staying healthy. In between checkups, there are plenty of things you can do on your own. Experts have done a lot of research about which lifestyle changes and preventive measures are most likely to keep you healthy. Ask your health care provider for more information. WEIGHT AND DIET  Eat a healthy diet  Be sure to include plenty of vegetables, fruits, low-fat dairy products, and lean protein.  Do not eat a lot of foods high in solid fats, added sugars, or salt.  Get regular exercise. This is one of the most important things you can do for your health.  Most adults should exercise for at least 150 minutes each week. The exercise should increase your heart rate and make you sweat (moderate-intensity exercise).  Most adults should also do strengthening exercises at least twice a week. This is in addition to the moderate-intensity exercise.  Maintain a healthy weight  Body mass index (BMI) is a measurement that can be used to identify possible weight problems. It estimates body fat based on height and weight. Your health care provider can help determine your BMI and help you achieve or maintain a healthy weight.  For females 20 years of age and older:   A BMI below 18.5 is considered underweight.  A BMI of 18.5 to 24.9 is normal.  A BMI of 25 to 29.9 is considered overweight.  A BMI of 30 and above is considered obese.  Watch levels of cholesterol and blood lipids  You should start having your blood tested for lipids and cholesterol at 58 years of age, then have this test every 5 years.  You may need to have your cholesterol levels checked more often if:  Your lipid  or cholesterol levels are high.  You are older than 58 years of age.  You are at high risk for heart disease.  CANCER SCREENING   Lung Cancer  Lung cancer screening is recommended for adults 55-80 years old who are at high risk for lung cancer because of a history of smoking.  A yearly low-dose CT scan of the lungs is recommended for people who:  Currently smoke.  Have quit within the past 15 years.  Have at least a 30-pack-year history of smoking. A pack year is smoking an average of one pack of cigarettes a day for 1 year.  Yearly screening should continue until it has been 15 years since you quit.  Yearly screening should stop if you develop a health problem that would prevent you from having lung cancer treatment.  Breast Cancer  Practice breast self-awareness. This means understanding how your breasts normally appear and feel.  It also means doing regular breast self-exams. Let your health care provider know about any changes, no matter how small.  If you are in your 20s or 30s, you should have a clinical breast exam (CBE) by a health care provider every 1-3 years as part of a regular health exam.  If you are 40 or older, have a CBE every year. Also consider having a breast X-ray (mammogram) every year.  If you have a family history of breast cancer, talk to your health care provider about genetic screening.  If you   are at high risk for breast cancer, talk to your health care provider about having an MRI and a mammogram every year.  Breast cancer gene (BRCA) assessment is recommended for women who have family members with BRCA-related cancers. BRCA-related cancers include:  Breast.  Ovarian.  Tubal.  Peritoneal cancers.  Results of the assessment will determine the need for genetic counseling and BRCA1 and BRCA2 testing. Cervical Cancer Your health care provider may recommend that you be screened regularly for cancer of the pelvic organs (ovaries, uterus, and  vagina). This screening involves a pelvic examination, including checking for microscopic changes to the surface of your cervix (Pap test). You may be encouraged to have this screening done every 3 years, beginning at age 21.  For women ages 30-65, health care providers may recommend pelvic exams and Pap testing every 3 years, or they may recommend the Pap and pelvic exam, combined with testing for human papilloma virus (HPV), every 5 years. Some types of HPV increase your risk of cervical cancer. Testing for HPV may also be done on women of any age with unclear Pap test results.  Other health care providers may not recommend any screening for nonpregnant women who are considered low risk for pelvic cancer and who do not have symptoms. Ask your health care provider if a screening pelvic exam is right for you.  If you have had past treatment for cervical cancer or a condition that could lead to cancer, you need Pap tests and screening for cancer for at least 20 years after your treatment. If Pap tests have been discontinued, your risk factors (such as having a new sexual partner) need to be reassessed to determine if screening should resume. Some women have medical problems that increase the chance of getting cervical cancer. In these cases, your health care provider may recommend more frequent screening and Pap tests. Colorectal Cancer  This type of cancer can be detected and often prevented.  Routine colorectal cancer screening usually begins at 58 years of age and continues through 58 years of age.  Your health care provider may recommend screening at an earlier age if you have risk factors for colon cancer.  Your health care provider may also recommend using home test kits to check for hidden blood in the stool.  A small camera at the end of a tube can be used to examine your colon directly (sigmoidoscopy or colonoscopy). This is done to check for the earliest forms of colorectal  cancer.  Routine screening usually begins at age 50.  Direct examination of the colon should be repeated every 5-10 years through 58 years of age. However, you may need to be screened more often if early forms of precancerous polyps or small growths are found. Skin Cancer  Check your skin from head to toe regularly.  Tell your health care provider about any new moles or changes in moles, especially if there is a change in a mole's shape or color.  Also tell your health care provider if you have a mole that is larger than the size of a pencil eraser.  Always use sunscreen. Apply sunscreen liberally and repeatedly throughout the day.  Protect yourself by wearing long sleeves, pants, a wide-brimmed hat, and sunglasses whenever you are outside. HEART DISEASE, DIABETES, AND HIGH BLOOD PRESSURE   High blood pressure causes heart disease and increases the risk of stroke. High blood pressure is more likely to develop in:  People who have blood pressure in the high end   of the normal range (130-139/85-89 mm Hg).  People who are overweight or obese.  People who are African American.  If you are 38-23 years of age, have your blood pressure checked every 3-5 years. If you are 61 years of age or older, have your blood pressure checked every year. You should have your blood pressure measured twice--once when you are at a hospital or clinic, and once when you are not at a hospital or clinic. Record the average of the two measurements. To check your blood pressure when you are not at a hospital or clinic, you can use:  An automated blood pressure machine at a pharmacy.  A home blood pressure monitor.  If you are between 45 years and 39 years old, ask your health care provider if you should take aspirin to prevent strokes.  Have regular diabetes screenings. This involves taking a blood sample to check your fasting blood sugar level.  If you are at a normal weight and have a low risk for diabetes,  have this test once every three years after 58 years of age.  If you are overweight and have a high risk for diabetes, consider being tested at a younger age or more often. PREVENTING INFECTION  Hepatitis B  If you have a higher risk for hepatitis B, you should be screened for this virus. You are considered at high risk for hepatitis B if:  You were born in a country where hepatitis B is common. Ask your health care provider which countries are considered high risk.  Your parents were born in a high-risk country, and you have not been immunized against hepatitis B (hepatitis B vaccine).  You have HIV or AIDS.  You use needles to inject street drugs.  You live with someone who has hepatitis B.  You have had sex with someone who has hepatitis B.  You get hemodialysis treatment.  You take certain medicines for conditions, including cancer, organ transplantation, and autoimmune conditions. Hepatitis C  Blood testing is recommended for:  Everyone born from [birth year] through 1963.  Anyone with known risk factors for hepatitis C. Sexually transmitted infections (STIs)  You should be screened for sexually transmitted infections (STIs) including gonorrhea and chlamydia if:  You are sexually active and are younger than 58 years of age.  You are older than 58 years of age and your health care provider tells you that you are at risk for this type of infection.  Your sexual activity has changed since you were last screened and you are at an increased risk for chlamydia or gonorrhea. Ask your health care provider if you are at risk.  If you do not have HIV, but are at risk, it may be recommended that you take a prescription medicine daily to prevent HIV infection. This is called pre-exposure prophylaxis (PrEP). You are considered at risk if:  You are sexually active and do not regularly use condoms or know the HIV status of your partner(s).  You take drugs by injection.  You are sexually  active with a partner who has HIV. Talk with your health care provider about whether you are at high risk of being infected with HIV. If you choose to begin PrEP, you should first be tested for HIV. You should then be tested every 3 months for as long as you are taking PrEP.  PREGNANCY   If you are premenopausal and you may become pregnant, ask your health care provider about preconception counseling.  If you may  become pregnant, take 400 to 800 micrograms (mcg) of folic acid every day.  If you want to prevent pregnancy, talk to your health care provider about birth control (contraception). OSTEOPOROSIS AND MENOPAUSE   Osteoporosis is a disease in which the bones lose minerals and strength with aging. This can result in serious bone fractures. Your risk for osteoporosis can be identified using a bone density scan.  If you are 61 years of age or older, or if you are at risk for osteoporosis and fractures, ask your health care provider if you should be screened.  Ask your health care provider whether you should take a calcium or vitamin D supplement to lower your risk for osteoporosis.  Menopause may have certain physical symptoms and risks.  Hormone replacement therapy may reduce some of these symptoms and risks. Talk to your health care provider about whether hormone replacement therapy is right for you.  HOME CARE INSTRUCTIONS   Schedule regular health, dental, and eye exams.  Stay current with your immunizations.   Do not use any tobacco products including cigarettes, chewing tobacco, or electronic cigarettes.  If you are pregnant, do not drink alcohol.  If you are breastfeeding, limit how much and how often you drink alcohol.  Limit alcohol intake to no more than 1 drink per day for nonpregnant women. One drink equals 12 ounces of beer, 5 ounces of wine, or 1 ounces of hard liquor.  Do not use street drugs.  Do not share needles.  Ask your health care provider for help if  you need support or information about quitting drugs.  Tell your health care provider if you often feel depressed.  Tell your health care provider if you have ever been abused or do not feel safe at home.   This information is not intended to replace advice given to you by your health care provider. Make sure you discuss any questions you have with your health care provider.   Document Released: 01/24/2011 Document Revised: 08/01/2014 Document Reviewed: 06/12/2013 Elsevier Interactive Patient Education [employer].

## 2016-01-08 NOTE — Progress Notes (Signed)
60 y.o. [id number] [name] here for annual exam.    Using generic Vagifem tabs and testosterone cream.  Wants to continue.  Using the testosterone once a day for 5 days and then twice a day for the other two days.  Starting lithium in stead of Abilify to reduced decreased libido.   Having urinary frequency, urgency, and pain.  This is not her usual IC pain. Wants an abx. Night time urination.  Some GSI.   Seeing neurology for pain and spasm in her thigh and arm numbness and neck pain.  Has a ruptured disc in her low back and in her neck.  Has arthritis also.  Will be having cervical spine surgery with Dr. Newman Pies. Will do back surgery eventually.   PCP:   Dr. Duane Lope  Patient's last menstrual period was 07/25/1998.           Sexually active: Yes.    The current method of family planning is post menopausal status.    Exercising: Yes.    Walking, Yoga, Stretching, Pool Smoker:  no  Health Maintenance: Pap:  12/19/14 WNL  Neg HR HPV History of abnormal Pap:  no MMG:  10/23/15 BIRADS1 Colonoscopy:  11/20/2006--Per pt needs to schedule now.  BMD:  10/24/13  Result  Osteopenia TDaP:  10/11/2005.  Will do with PCP. Gardasil:   n/a HIV: PCP Hep C: PCP Screening Labs: PCP Hb today: PCP , Urine today: 1+ Leuko   reports that she has never smoked. She has never used smokeless tobacco. She reports that she does not drink alcohol or use illicit drugs.  Past Medical History  Diagnosis Date  . Disorders of porphyrin metabolism   . Irritable bowel syndrome   . Fibromyalgia   . Seronegative arthritis     [name]  . GERD (gastroesophageal reflux disease)   . Allergy   . Chronic pain   . Cholecystitis   . Internal hemorrhoids   . Interstitial cystitis 06-06-12    hx.  . Raynauds disease     hx.  . 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 C6- C7 -hx. past fusion(bone graft used)  . Anemia   . PONV (postoperative nausea and  vomiting)     now uses stomach blockers and no ponv  . Abdominal pain last 4 months    and nausea also  . Depression     bipolar depression  . 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 DDD (degenerative disc disease), lumbar 09/2015    dextroscoliosis with multileve DDD and facet arthrosis most notable for R foraminal disc protrusion L4/5 producing severe R neural foraminal stenosis abutting R L4 nerve root, moderate spinal canal and mild lat recesss and R neural foraminal stenosis L3/4 (MRI)    Past Surgical History  Procedure Laterality Date  . Cervical fusion  2004    C5/6  . Cystoscopy    . Hysteroscopy w/ endometrial ablation    . Breast enhancement surgery  2010  . Nasal sinus surgery      x5  . Ercp  05/22/2012    Procedure: ENDOSCOPIC RETROGRADE CHOLANGIOPANCREATOGRAPHY (ERCP);  Surgeon: [name], MD,FACG;  Location: [facility];  Service: Endoscopy;  Laterality: N/A;  . Cesarean section      x 1  . Hernia repair      inguinal  . Cholecystectomy  06/11/2012    Procedure: LAPAROSCOPIC CHOLECYSTECTOMY WITH INTRAOPERATIVE CHOLANGIOGRAM;  Surgeon: [name], MD,FACS;  Location: [facility];  Service: General;  Laterality: N/A;  . Bunionectomy Bilateral yrs ago  . Ercp N/A 09/17/2013    Procedure: ENDOSCOPIC RETROGRADE CHOLANGIOPANCREATOGRAPHY (ERCP);  Surgeon: [name], MD;  Location: [facility];  Service: Endoscopy;  Laterality: N/A;  . Hemorrhoid banding  09-23-13    --Dr. Greer Pickerel  . Breast implant exchange  10/2014    exchange saline implants, B mastopexy/capsulorraphy ([facility])    Current Outpatient Prescriptions  Medication Sig Dispense Refill  . acyclovir (ZOVIRAX) 800 MG tablet Take one-half tablet by  mouth every day 45 tablet 3  . ALPRAZolam (XANAX) 1 MG tablet Take 1 tablet (1 mg total) by mouth 3 (three) times daily as needed for anxiety. With 1/2 tab at night time 60 tablet 0  .  amLODipine (NORVASC) 5 MG tablet Take 1 tablet (5 mg total) by mouth daily. 90 tablet 3  . amphetamine-dextroamphetamine (ADDERALL XR) 30 MG 24 hr capsule Take 1 capsule (30 mg total) by mouth every morning. 90 capsule 0  . ARIPiprazole (ABILIFY) 10 MG tablet Take 5 mg by mouth at bedtime.     Marland Kitchen aspirin EC 81 MG tablet Take 81 mg by mouth every other day.     . Calcium-Magnesium-Vitamin D [id number] MG-MG-UNIT TB24 Take 1 tablet by mouth 2 (two) times daily.    Marland Kitchen co-enzyme Q-10 50 MG capsule Take 50 mg by mouth daily.    . cyanocobalamin (,VITAMIN B-12,) 1000 MCG/ML injection Inject 1 mL (1,000 mcg total) into the muscle every 21 ( twenty-one) days. 4 mL 3  . diclofenac sodium (VOLTAREN) 1 % GEL Apply 1 g topically 2 (two) times daily as needed (pain).     Marland Kitchen esomeprazole (NEXIUM) 40 MG capsule Take 1 capsule (40 mg total) by mouth daily at 12 noon.    . Eszopiclone 3 MG TABS Take 1 tablet (3 mg total) by mouth at bedtime as needed. Take immediately before bedtime 30 tablet 3  . ferrous sulfate 325 (65 FE) MG tablet Take 1 tablet (325 mg total) by mouth every Monday, Wednesday, and Friday.    . fluticasone (FLONASE) 50 MCG/ACT nasal spray Place 2 sprays into both nostrils daily. 16 g 3  . hydrocortisone (ANUSOL-HC) 25 MG suppository Place 25 mg rectally 2 (two) times daily as needed for hemorrhoids or itching.    . Indomethacin POWD by Does not apply route 3 (three) times daily.    Marland Kitchen lamoTRIgine (LAMICTAL) 150 MG tablet Take 150 mg by mouth daily.     Marland Kitchen LINZESS 290 MCG CAPS capsule TAKE 1 CAPSULE EVERY DAY 30 capsule 11  . lithium carbonate (ESKALITH) 450 MG CR tablet     . MAGNESIUM PO Take 1,000 mg by mouth 3 (three) times daily.    . Manganese 50 MG TABS Take 50 mg by mouth daily.     . Morphine-Naltrexone 80-3.2 MG CPCR Take 1 tablet by mouth daily. 90 capsule 0  . Multiple Vitamins-Minerals (ICAPS AREDS 2) CAPS Take 1 capsule by mouth daily.    . NONFORMULARY OR COMPOUNDED ITEM Testosterone  propionate 2% in white petrolatum, apply once daily.  Dispense:  60 grams. 60 each 0  . ondansetron (ZOFRAN) 4 MG tablet Take 1 tablet (4 mg total) by mouth every 8 (eight) hours as needed for nausea or vomiting. 30 tablet 1  . pentosan polysulfate (ELMIRON) 100 MG capsule Take 200 mg by mouth 2 (two) times daily.     Marland Kitchen  Polyethylene Glycol 3350 (MIRALAX PO) Take 17 g by mouth 4 (four) times daily.     . pregabalin (LYRICA) 150 MG capsule Take 150 mg by mouth 3 (three) times daily.     . Testosterone POWD by Does not apply route.    . traMADol (ULTRAM) 50 MG tablet Take 1 tablet (50 mg total) by mouth 2 (two) times daily as needed. 40 tablet 0  . ursodiol (ACTIGALL) 300 MG capsule TAKE ONE TABLET BY MOUTH TWICE DAILY 60 capsule 5  . YUVAFEM 10 MCG TABS vaginal tablet Insert 1 tablet (10 mcg  total) vaginally 3 (three)  times a week 36 tablet 0   No current facility-administered medications for this visit.    Family History  Problem Relation Age of Onset  . CAD Father 42    MI, nonsmoker  . Esophageal cancer Father 63  . Scleroderma Mother   . Hypertension Mother   . Esophageal cancer Paternal Grandfather   . Diabetes Maternal Grandmother   . Stroke Neg Hx   . Stomach cancer Father   . Stomach cancer Paternal Grandfather     ROS:  Pertinent items are noted in HPI.  Otherwise, a comprehensive ROS was negative.  Exam:   BP 100/70 mmHg  Pulse 74  Resp 16  Ht 5' 4.25" (1.632 m)  Wt 130 lb (58.968 kg)  BMI 22.14 kg/m2  LMP 07/25/1998    General appearance: alert, cooperative and appears stated age Head: Normocephalic, without obvious abnormality, atraumatic Neck: no adenopathy, supple, symmetrical, trachea midline and thyroid normal to inspection and palpation Lungs: clear to auscultation bilaterally Breasts: normal appearance, no masses or tenderness, Inspection negative, No nipple retraction or dimpling, No nipple discharge or bleeding, No axillary or supraclavicular adenopathy,  consistent with bilateral implants Heart: regular rate and rhythm Abdomen:  soft, non-tender; no masses, no organomegaly Extremities: extremities normal, atraumatic, no cyanosis or edema Skin: Skin color, texture, turgor normal. No rashes or lesions Lymph nodes: Cervical, supraclavicular, and axillary nodes normal. No abnormal inguinal nodes palpated Neurologic: Grossly normal  Pelvic: External genitalia:  no lesions              Urethra:  normal appearing urethra with no masses, tenderness or lesions              Bartholins and Skenes: normal                 Vagina: normal appearing vagina with normal color and discharge, no lesions              Cervix: no lesions              Pap taken: No. Bimanual Exam:  Uterus:  normal size, contour, position, consistency, mobility, non-tender              Adnexa: normal adnexa and no mass, fullness, tenderness              Rectal exam: Yes.  .  Confirms.              Anus:  normal sphincter tone, no lesions  Chaperone was present for exam.  Assessment:   Well woman visit with normal exam. Osteopenia.  Will wait to do BMD next year.  Low libido.  Vaginal atrophy.  UTI.   Plan: Yearly mammogram recommended after age 42.  Recommended self breast exam.  Pap and HR HPV as above. Discussed Calcium, Vitamin D, regular exercise program including cardiovascular and weight bearing exercise.  Labs performed.  Yes.  .   See orders.  Testosterone.  Urine micro and culture.  Prescription medication(s) given.  Yes.  .  See orders.  Testosterone in petrolatum.  Vagifem.  Refills for one year.  Keflex 500 mg po qid for one week.  Diflucan for yeast infection if this occurs. Follow up annually and prn.      After visit summary provided.

## 2016-01-09 LAB — URINALYSIS, MICROSCOPIC ONLY
Bacteria, UA: NONE SEEN [HPF]
CRYSTALS: NONE SEEN [HPF]
Casts: NONE SEEN [LPF]
RBC / HPF: NONE SEEN RBC/HPF (ref <=2)
YEAST: NONE SEEN [HPF]

## 2016-01-11 LAB — TESTOS,TOTAL,FREE AND SHBG (FEMALE)
SEX HORMONE BINDING GLOB.: 82 nmol/L — AB (ref 14–73)
Testosterone, Free: 2.6 pg/mL (ref 0.1–6.4)
Testosterone,Total,LC/MS/MS: 50 ng/dL — ABNORMAL HIGH (ref 2–45)

## 2016-01-11 LAB — URINE CULTURE: Colony Count: >=100000

## 2016-01-14 ENCOUNTER — Other Ambulatory Visit: Payer: Self-pay | Admitting: Family Medicine

## 2016-01-14 NOTE — Telephone Encounter (Signed)
Ok to refill in Dr. Synthia Innocent absence? Last filled 10/05/15 3RF. (He normally prints this for her). Thanks!

## 2016-01-22 ENCOUNTER — Encounter: Payer: Self-pay | Admitting: Family Medicine

## 2016-01-24 ENCOUNTER — Encounter: Payer: Self-pay | Admitting: Family Medicine

## 2016-01-24 NOTE — Telephone Encounter (Signed)
Reviewed NSG notes [name])

## 2016-01-25 ENCOUNTER — Telehealth: Payer: Self-pay | Admitting: Gastroenterology

## 2016-01-25 MED ORDER — ESOMEPRAZOLE MAGNESIUM 40 MG PO CPDR: 40.0000 mg | DELAYED_RELEASE_CAPSULE | Freq: Every day | ORAL | Status: DC

## 2016-01-25 NOTE — Telephone Encounter (Signed)
Patient states she would like a 30 day refill for generic Nexium sent to [facility]. Refill sent to the pahrmacy. She also wanted Dr. Fuller Plan to know since her last visit with him she has been diagnosed with 2 ruptured discs in her neck and is going to have surgery in August. She is due for her Colonoscopy in July. She will call after she recovers to schedule her Colonoscopy.

## 2016-02-22 ENCOUNTER — Encounter: Payer: Self-pay | Admitting: Family Medicine

## 2016-02-23 HISTORY — PX: ANTERIOR CERVICAL DECOMP/DISCECTOMY FUSION: SHX1161

## 2016-02-24 MED ORDER — MORPHINE-NALTREXONE 80-3.2 MG PO CPCR: 1.0000 | ORAL_CAPSULE | Freq: Every day | ORAL | 0 refills | Status: DC

## 2016-02-24 NOTE — Telephone Encounter (Signed)
Rx printed and in [facility] box.

## 2016-02-24 NOTE — Telephone Encounter (Signed)
Patient notified and Rx placed up front for pick up. 

## 2016-03-02 ENCOUNTER — Ambulatory Visit: Admit: 2016-03-02 | Payer: Self-pay | Admitting: Neurosurgery

## 2016-03-02 SURGERY — ANTERIOR CERVICAL DECOMPRESSION/DISCECTOMY FUSION 2 LEVELS
Anesthesia: General

## 2016-03-06 ENCOUNTER — Encounter: Payer: Self-pay | Admitting: Family Medicine

## 2016-03-07 MED ORDER — AMPHETAMINE-DEXTROAMPHET ER 30 MG PO CP24: 30.0000 mg | ORAL_CAPSULE | ORAL | 0 refills | Status: DC

## 2016-03-07 NOTE — Telephone Encounter (Signed)
printed Rx and in [facility] box.

## 2016-03-08 NOTE — Telephone Encounter (Signed)
Rx placed up front for pick up. 

## 2016-03-18 ENCOUNTER — Encounter: Payer: Self-pay | Admitting: Family Medicine

## 2016-03-18 ENCOUNTER — Other Ambulatory Visit: Payer: Self-pay

## 2016-03-18 MED ORDER — ESZOPICLONE 3 MG PO TABS: ORAL_TABLET | ORAL | 3 refills | Status: DC

## 2016-03-18 NOTE — Telephone Encounter (Signed)
Pt left v/m requesting refill generic lunesta. Last refilled # 30 on 01/15/16. Pt last seen 12/14/15. Pt request to call in med today;03/18/16.[facility].

## 2016-03-18 NOTE — Telephone Encounter (Signed)
Phoned in. Pt notified via mychart.

## 2016-04-11 ENCOUNTER — Encounter: Payer: Self-pay | Admitting: Family Medicine

## 2016-04-12 ENCOUNTER — Other Ambulatory Visit: Payer: Self-pay | Admitting: Family Medicine

## 2016-04-15 ENCOUNTER — Ambulatory Visit (INDEPENDENT_AMBULATORY_CARE_PROVIDER_SITE_OTHER): Payer: Medicare Other

## 2016-04-15 ENCOUNTER — Ambulatory Visit: Payer: Medicare Other

## 2016-04-15 DIAGNOSIS — Z23 Encounter for immunization: Secondary | ICD-10-CM

## 2016-04-16 ENCOUNTER — Encounter: Payer: Self-pay | Admitting: Family Medicine

## 2016-04-20 ENCOUNTER — Other Ambulatory Visit: Payer: Self-pay

## 2016-04-20 NOTE — Telephone Encounter (Signed)
Pt left v/m; pt has reached [employer] from ins and pt request printed 90 day rx for lunesta. Pt request cb when ready for pick up. Pt last seen 12/14/15. Last refilled # 30 x 3 on 03/18/16.

## 2016-04-21 MED ORDER — ESZOPICLONE 3 MG PO TABS: ORAL_TABLET | ORAL | 0 refills | Status: DC

## 2016-04-21 NOTE — Telephone Encounter (Signed)
Patient notified and Rx placed up front for pick up. 

## 2016-04-21 NOTE — Telephone Encounter (Signed)
Printed and in Kim's box 

## 2016-05-16 ENCOUNTER — Other Ambulatory Visit: Payer: Self-pay | Admitting: Family Medicine

## 2016-05-18 ENCOUNTER — Other Ambulatory Visit: Payer: Self-pay

## 2016-05-18 MED ORDER — ESOMEPRAZOLE MAGNESIUM 40 MG PO CPDR: 40.0000 mg | DELAYED_RELEASE_CAPSULE | Freq: Every day | ORAL | 3 refills | Status: DC

## 2016-05-23 ENCOUNTER — Encounter: Payer: Self-pay | Admitting: Family Medicine

## 2016-05-23 NOTE — Telephone Encounter (Signed)
Ok to refill? Last filled 02/24/16 #90 0RF

## 2016-05-24 MED ORDER — MORPHINE-NALTREXONE 80-3.2 MG PO CPCR: 1.0000 | ORAL_CAPSULE | Freq: Every day | ORAL | 0 refills | Status: DC

## 2016-05-24 NOTE — Telephone Encounter (Signed)
Printed and in Kim's box 

## 2016-05-24 NOTE — Telephone Encounter (Signed)
Patient notified and Rx placed up front for pick up. 

## 2016-06-06 ENCOUNTER — Encounter: Payer: Self-pay | Admitting: Family Medicine

## 2016-06-06 ENCOUNTER — Ambulatory Visit (INDEPENDENT_AMBULATORY_CARE_PROVIDER_SITE_OTHER): Payer: Medicare Other | Admitting: Family Medicine

## 2016-06-06 VITALS — BP 128/80 | HR 107 | Wt 133.8 lb

## 2016-06-06 DIAGNOSIS — R5382 Chronic fatigue, unspecified: Secondary | ICD-10-CM

## 2016-06-06 DIAGNOSIS — R682 Dry mouth, unspecified: Secondary | ICD-10-CM | POA: Diagnosis not present

## 2016-06-06 DIAGNOSIS — M1380 Other specified arthritis, unspecified site: Secondary | ICD-10-CM

## 2016-06-06 DIAGNOSIS — G9332 Myalgic encephalomyelitis/chronic fatigue syndrome: Secondary | ICD-10-CM

## 2016-06-06 DIAGNOSIS — F331 Major depressive disorder, recurrent, moderate: Secondary | ICD-10-CM

## 2016-06-06 MED ORDER — LINACLOTIDE 290 MCG PO CAPS: 290.0000 ug | ORAL_CAPSULE | Freq: Every day | ORAL | 1 refills | Status: DC

## 2016-06-06 MED ORDER — AMPHETAMINE-DEXTROAMPHET ER 30 MG PO CP24: 30.0000 mg | ORAL_CAPSULE | ORAL | 0 refills | Status: DC

## 2016-06-06 NOTE — Assessment & Plan Note (Signed)
New dry mouth, dry eyes, ?med related. Consider eval for sjogren syndrome at CPE labs next week if ongoing. ?relation of recent ACDF surgery.

## 2016-06-06 NOTE — Patient Instructions (Addendum)
Linzess and adderall refilled today.  Caution with adderall as this can lead to rapid heart rate.  Let me know if ongoing trouble into next labs and we will evaluate for other causes of dry mouth.  Continue current medicines.  Keep physical appt

## 2016-06-06 NOTE — Assessment & Plan Note (Signed)
Followed by psych Dr Toy Care. Reviewed current stressors. Has f/u with psych early 2018.

## 2016-06-06 NOTE — Assessment & Plan Note (Signed)
Followed by rheum. 

## 2016-06-06 NOTE — Assessment & Plan Note (Addendum)
adderall XR refilled. Pt to monitor stimulant effect with new tachycardia found - anticipated anxiety related

## 2016-06-06 NOTE — Progress Notes (Signed)
Pre visit review using our clinic review tool, if applicable. No additional management support is needed unless otherwise documented below in the visit note. 

## 2016-06-06 NOTE — Progress Notes (Signed)
BP 128/80   Pulse (!) 107   Wt 133 lb 12.8 oz (60.7 kg)   LMP 07/25/1998   SpO2 98%   BMI 22.79 kg/m    CC: multiple issues Subjective:    Patient ID: Rachel Duran, female    DOB: 1956/05/28, 60 y.o.   MRN: [id number]  HPI: Rachel Duran is a 60 y.o. female presenting on 06/06/2016 for Mouth Lesions (mouth dryness, bumps on tongue, excessive thirst. going on for two months now. ); Stress; and Medication Refill (adderall, linzess 90 day supply. )   Over the last 2 months pt has noticed oral lesions described as bumps on tongue associated with dry mouth and excessive thirst despite biotene mouth wash. Using sensitive tooth pate. Noticing increased dry eyes as well but denies skin changes/skin rash. Known seronegative arthritis followed by Dr Estanislado Pandy.   Recent C3/4 and C4/5 ACDF with plating [name]) 02/2016. Has f/u planned next month. Ongoing mild dysphagia with meats.   Increased stress. Noticing ongoing rapid pulse. More trouble with anxiety. Sees psychiatrist Dr Toy Care who prescribes xanax. Patient also takes lithium, lamictal and lyrica. Denies chest pain, tightness, dyspnea, or cough.   Requests adderall and linzess refilled.  Last visit adderall XR was ineffective, plan was to pursue sleep study. She has not completed this.   Relevant past medical, surgical, family and social history reviewed and updated as indicated. Interim medical history since our last visit reviewed. Allergies and medications reviewed and updated. Current Outpatient Prescriptions on File Prior to Visit  Medication Sig  . acyclovir (ZOVIRAX) 800 MG tablet Take one-half tablet by  mouth every day  . ALPRAZolam (XANAX) 1 MG tablet Take 1 tablet (1 mg total) by mouth 3 (three) times daily as needed for anxiety. With 1/2 tab at night time  . amLODipine (NORVASC) 5 MG tablet Take 1 tablet (5 mg total) by mouth daily.  Marland Kitchen aspirin EC 81 MG tablet Take 81 mg by mouth every other day.   .  Calcium-Magnesium-Vitamin D [id number] MG-MG-UNIT TB24 Take 1 tablet by mouth 2 (two) times daily.  Marland Kitchen co-enzyme Q-10 50 MG capsule Take 50 mg by mouth daily.  . cyanocobalamin (,VITAMIN B-12,) 1000 MCG/ML injection Inject 1 mL (1,000 mcg total) into the muscle every 21 ( twenty-one) days.  . diclofenac sodium (VOLTAREN) 1 % GEL Apply 1 g topically 2 (two) times daily as needed (pain).   Marland Kitchen esomeprazole (NEXIUM) 40 MG capsule Take 1 capsule (40 mg total) by mouth daily at 12 noon.  . Estradiol (YUVAFEM) 10 MCG TABS vaginal tablet Insert 1 tablet (10 mcg  total) vaginally 3 (three)  times a week  . Eszopiclone 3 MG TABS TAKE ONE TABLET BY MOUTH AT BEDTIME AS NEEDED *TAKE IMMEDIATELY BEFORE BEDTIME*  . ferrous sulfate 325 (65 FE) MG tablet Take 1 tablet (325 mg total) by mouth every Monday, Wednesday, and Friday.  . fluticasone (FLONASE) 50 MCG/ACT nasal spray 2 SPRAYS INTO BOTH NOSTRILS DAILY  . hydrocortisone (ANUSOL-HC) 25 MG suppository Place 25 mg rectally 2 (two) times daily as needed for hemorrhoids or itching.  . Indomethacin POWD by Does not apply route 3 (three) times daily.  Marland Kitchen lamoTRIgine (LAMICTAL) 150 MG tablet Take 150 mg by mouth daily.   Marland Kitchen lithium carbonate (ESKALITH) 450 MG CR tablet   . MAGNESIUM PO Take 1,000 mg by mouth 3 (three) times daily.  . Manganese 50 MG TABS Take 50 mg by mouth daily.   . Morphine-Naltrexone 80-3.2 MG  CPCR Take 1 tablet by mouth daily.  . Multiple Vitamins-Minerals (ICAPS AREDS 2) CAPS Take 1 capsule by mouth daily.  . NONFORMULARY OR COMPOUNDED ITEM Testosterone propionate 2% in white petrolatum, apply once daily for 5 days per week and twice a day for 2 days per week.  Dispense:  60 grams.  . ondansetron (ZOFRAN) 4 MG tablet Take 1 tablet (4 mg total) by mouth every 8 (eight) hours as needed for nausea or vomiting.  . pentosan polysulfate (ELMIRON) 100 MG capsule Take 200 mg by mouth 2 (two) times daily.   . Polyethylene Glycol 3350 (MIRALAX PO) Take 17  g by mouth 4 (four) times daily.   . pregabalin (LYRICA) 150 MG capsule Take 150 mg by mouth 3 (three) times daily.   . Testosterone POWD by Does not apply route.  . traMADol (ULTRAM) 50 MG tablet Take 1 tablet (50 mg total) by mouth 2 (two) times daily as needed.  . ursodiol (ACTIGALL) 300 MG capsule TAKE ONE TABLET BY MOUTH TWICE DAILY   No current facility-administered medications on file prior to visit.     Review of Systems Per HPI unless specifically indicated in ROS section     Objective:    BP 128/80   Pulse (!) 107   Wt 133 lb 12.8 oz (60.7 kg)   LMP 07/25/1998   SpO2 98%   BMI 22.79 kg/m   Wt Readings from Last 3 Encounters:  06/06/16 133 lb 12.8 oz (60.7 kg)  01/08/16 130 lb (59 kg)  12/14/15 130 lb 12 oz (59.3 kg)    Physical Exam  Constitutional: She appears well-developed and well-nourished. No distress.  HENT:  Head: Normocephalic and atraumatic.  Mouth/Throat: Oropharynx is clear and moist. No oropharyngeal exudate.  Small papules on tongue No enlarged salivary glands or parotid glands  Eyes: Conjunctivae are normal. Pupils are equal, round, and reactive to light.  Neck: Normal range of motion. Neck supple. No thyromegaly present.  Cardiovascular: Normal rate, regular rhythm, normal heart sounds and intact distal pulses.   No murmur heard. Pulmonary/Chest: Effort normal and breath sounds normal. No respiratory distress. She has no wheezes. She has no rales.  Musculoskeletal: She exhibits no edema.  Lymphadenopathy:    She has no cervical adenopathy.  Skin: Skin is warm and dry. No rash noted. No erythema.  Psychiatric: She has a normal mood and affect.  Nursing note and vitals reviewed.     Assessment & Plan:  Over 25 minutes were spent face-to-face with the patient during this encounter and >50% of that time was spent on counseling and coordination of care  Problem List Items Addressed This Visit    CFS (chronic fatigue syndrome)    adderall XR  refilled. Pt to monitor stimulant effect with new tachycardia found - anticipated anxiety related      Dry mouth - Primary    New dry mouth, dry eyes, ?med related. Consider eval for sjogren syndrome at CPE labs next week if ongoing. ?relation of recent ACDF surgery.       MDD (major depressive disorder), recurrent episode, moderate ([place])    Followed by psych Dr Toy Care. Reviewed current stressors. Has f/u with psych early 2018.       Seronegative arthritis    Followed by rheum.           Follow up plan: Return if symptoms worsen or fail to improve.  Ria Bush, MD

## 2016-06-11 ENCOUNTER — Other Ambulatory Visit: Payer: Self-pay | Admitting: Family Medicine

## 2016-06-11 DIAGNOSIS — G9332 Myalgic encephalomyelitis/chronic fatigue syndrome: Secondary | ICD-10-CM

## 2016-06-11 DIAGNOSIS — R682 Dry mouth, unspecified: Secondary | ICD-10-CM

## 2016-06-11 DIAGNOSIS — R5382 Chronic fatigue, unspecified: Secondary | ICD-10-CM

## 2016-06-11 DIAGNOSIS — K219 Gastro-esophageal reflux disease without esophagitis: Secondary | ICD-10-CM

## 2016-06-11 DIAGNOSIS — R7303 Prediabetes: Secondary | ICD-10-CM

## 2016-06-11 DIAGNOSIS — E611 Iron deficiency: Secondary | ICD-10-CM

## 2016-06-11 DIAGNOSIS — M1380 Other specified arthritis, unspecified site: Secondary | ICD-10-CM

## 2016-06-11 DIAGNOSIS — R946 Abnormal results of thyroid function studies: Secondary | ICD-10-CM

## 2016-06-11 DIAGNOSIS — D638 Anemia in other chronic diseases classified elsewhere: Secondary | ICD-10-CM

## 2016-06-14 ENCOUNTER — Other Ambulatory Visit (INDEPENDENT_AMBULATORY_CARE_PROVIDER_SITE_OTHER): Payer: Medicare Other

## 2016-06-14 DIAGNOSIS — E611 Iron deficiency: Secondary | ICD-10-CM | POA: Diagnosis not present

## 2016-06-14 DIAGNOSIS — R7303 Prediabetes: Secondary | ICD-10-CM | POA: Diagnosis not present

## 2016-06-14 DIAGNOSIS — M1380 Other specified arthritis, unspecified site: Secondary | ICD-10-CM

## 2016-06-14 DIAGNOSIS — R946 Abnormal results of thyroid function studies: Secondary | ICD-10-CM

## 2016-06-14 DIAGNOSIS — K219 Gastro-esophageal reflux disease without esophagitis: Secondary | ICD-10-CM | POA: Diagnosis not present

## 2016-06-14 DIAGNOSIS — D638 Anemia in other chronic diseases classified elsewhere: Secondary | ICD-10-CM | POA: Diagnosis not present

## 2016-06-14 DIAGNOSIS — G9332 Myalgic encephalomyelitis/chronic fatigue syndrome: Secondary | ICD-10-CM

## 2016-06-14 DIAGNOSIS — R682 Dry mouth, unspecified: Secondary | ICD-10-CM

## 2016-06-14 DIAGNOSIS — R5382 Chronic fatigue, unspecified: Secondary | ICD-10-CM

## 2016-06-14 LAB — CBC WITH DIFFERENTIAL/PLATELET
BASOS PCT: 0.4 % (ref 0.0–3.0)
Basophils Absolute: 0 10*3/uL (ref 0.0–0.1)
EOS PCT: 1.2 % (ref 0.0–5.0)
Eosinophils Absolute: 0.1 10*3/uL (ref 0.0–0.7)
HEMATOCRIT: 38.4 % (ref 36.0–46.0)
HEMOGLOBIN: 12.6 g/dL (ref 12.0–15.0)
LYMPHS PCT: 14.5 % (ref 12.0–46.0)
Lymphs Abs: 1.7 10*3/uL (ref 0.7–4.0)
MCHC: 32.9 g/dL (ref 30.0–36.0)
MCV: 87.2 fl (ref 78.0–100.0)
Monocytes Absolute: 0.6 10*3/uL (ref 0.1–1.0)
Monocytes Relative: 4.7 % (ref 3.0–12.0)
NEUTROS ABS: 9.6 10*3/uL — AB (ref 1.4–7.7)
Neutrophils Relative %: 79.2 % — ABNORMAL HIGH (ref 43.0–77.0)
PLATELETS: 323 10*3/uL (ref 150.0–400.0)
RBC: 4.4 Mil/uL (ref 3.87–5.11)
RDW: 14.3 % (ref 11.5–15.5)
WBC: 12.1 10*3/uL — ABNORMAL HIGH (ref 4.0–10.5)

## 2016-06-14 LAB — BASIC METABOLIC PANEL
BUN: 8 mg/dL (ref 6–23)
CALCIUM: 9.9 mg/dL (ref 8.4–10.5)
CHLORIDE: 105 meq/L (ref 96–112)
CO2: 27 meq/L (ref 19–32)
Creatinine, Ser: 0.72 mg/dL (ref 0.40–1.20)
GFR: 88.31 mL/min (ref >60.00)
GLUCOSE: 144 mg/dL — AB (ref 70–99)
Potassium: 5 mEq/L (ref 3.5–5.1)
SODIUM: 140 meq/L (ref 135–145)

## 2016-06-14 LAB — SEDIMENTATION RATE: Sed Rate: 1 mm/hr (ref 0–30)

## 2016-06-14 LAB — VITAMIN B12: Vitamin B-12: 1055 pg/mL — ABNORMAL HIGH (ref 211–911)

## 2016-06-14 LAB — IBC PANEL
IRON: 110 ug/dL (ref 42–145)
Saturation Ratios: 26.3 % (ref 20.0–50.0)
Transferrin: 299 mg/dL (ref 212.0–360.0)

## 2016-06-14 LAB — T4, FREE: Free T4: 0.68 ng/dL (ref 0.60–1.60)

## 2016-06-14 LAB — FERRITIN: FERRITIN: 31 ng/mL (ref 10.0–291.0)

## 2016-06-14 LAB — HEMOGLOBIN A1C: Hgb A1c MFr Bld: 5.3 % (ref 4.6–6.5)

## 2016-06-14 LAB — TSH: TSH: 2.41 u[IU]/mL (ref 0.35–4.50)

## 2016-06-15 LAB — SJOGREN'S SYNDROME ANTIBODS(SSA + SSB)
SSA (RO) (ENA) ANTIBODY, IGG: NEGATIVE
SSB (LA) (ENA) ANTIBODY, IGG: NEGATIVE

## 2016-06-15 LAB — CYCLIC CITRUL PEPTIDE ANTIBODY, IGG: Cyclic Citrullin Peptide Ab: <16 Units

## 2016-06-20 ENCOUNTER — Telehealth: Payer: Self-pay | Admitting: Gastroenterology

## 2016-06-20 ENCOUNTER — Encounter: Payer: Self-pay | Admitting: Family Medicine

## 2016-06-20 ENCOUNTER — Ambulatory Visit (INDEPENDENT_AMBULATORY_CARE_PROVIDER_SITE_OTHER): Payer: Medicare Other | Admitting: Family Medicine

## 2016-06-20 VITALS — BP 110/70 | HR 89 | Temp 98.3°F | Resp 16 | Ht 64.0 in | Wt 133.5 lb

## 2016-06-20 DIAGNOSIS — F331 Major depressive disorder, recurrent, moderate: Secondary | ICD-10-CM

## 2016-06-20 DIAGNOSIS — H3553 Other dystrophies primarily involving the sensory retina: Secondary | ICD-10-CM

## 2016-06-20 DIAGNOSIS — Z0000 Encounter for general adult medical examination without abnormal findings: Secondary | ICD-10-CM

## 2016-06-20 DIAGNOSIS — R5382 Chronic fatigue, unspecified: Secondary | ICD-10-CM

## 2016-06-20 DIAGNOSIS — G9332 Myalgic encephalomyelitis/chronic fatigue syndrome: Secondary | ICD-10-CM

## 2016-06-20 DIAGNOSIS — Z7189 Other specified counseling: Secondary | ICD-10-CM

## 2016-06-20 DIAGNOSIS — R682 Dry mouth, unspecified: Secondary | ICD-10-CM

## 2016-06-20 DIAGNOSIS — E611 Iron deficiency: Secondary | ICD-10-CM

## 2016-06-20 DIAGNOSIS — G894 Chronic pain syndrome: Secondary | ICD-10-CM

## 2016-06-20 DIAGNOSIS — M1380 Other specified arthritis, unspecified site: Secondary | ICD-10-CM

## 2016-06-20 MED ORDER — TRAMADOL HCL 50 MG PO TABS: 50.0000 mg | ORAL_TABLET | Freq: Two times a day (BID) | ORAL | 0 refills | Status: DC | PRN

## 2016-06-20 MED ORDER — ESOMEPRAZOLE MAGNESIUM 40 MG PO CPDR: 40.0000 mg | DELAYED_RELEASE_CAPSULE | Freq: Every day | ORAL | 0 refills | Status: DC

## 2016-06-20 NOTE — Assessment & Plan Note (Signed)
Followed by Dr Kaur.   

## 2016-06-20 NOTE — Assessment & Plan Note (Addendum)
sjogren eval negative.  ?med vs stress related.

## 2016-06-20 NOTE — Assessment & Plan Note (Signed)
Discussed continue iron MWF, try with OJ or vit C

## 2016-06-20 NOTE — Assessment & Plan Note (Signed)
Preventative protocols reviewed and updated unless pt declined. Discussed healthy diet and lifestyle.  

## 2016-06-20 NOTE — Patient Instructions (Addendum)
Call your insurance about the shingles shot to see if it is covered or how much it would cost and where is cheaper (here or pharmacy).  If you want to receive here, call for nurse visit.  Vision and hearing screens today.   Health Maintenance, Female Introduction Adopting a healthy lifestyle and getting preventive care can go a long way to promote health and wellness. Talk with your health care provider about what schedule of regular examinations is right for you. This is a good chance for you to check in with your provider about disease prevention and staying healthy. In between checkups, there are plenty of things you can do on your own. Experts have done a lot of research about which lifestyle changes and preventive measures are most likely to keep you healthy. Ask your health care provider for more information. Weight and diet Eat a healthy diet  Be sure to include plenty of vegetables, fruits, low-fat dairy products, and lean protein.  Do not eat a lot of foods high in solid fats, added sugars, or salt.  Get regular exercise. This is one of the most important things you can do for your health.  Most adults should exercise for at least 150 minutes each week. The exercise should increase your heart rate and make you sweat (moderate-intensity exercise).  Most adults should also do strengthening exercises at least twice a week. This is in addition to the moderate-intensity exercise. Maintain a healthy weight  Body mass index (BMI) is a measurement that can be used to identify possible weight problems. It estimates body fat based on height and weight. Your health care provider can help determine your BMI and help you achieve or maintain a healthy weight.  For females 9 years of age and older:  A BMI below 18.5 is considered underweight.  A BMI of 18.5 to 24.9 is normal.  A BMI of 25 to 29.9 is considered overweight.  A BMI of 30 and above is considered obese. Watch levels of  cholesterol and blood lipids  You should start having your blood tested for lipids and cholesterol at 58 years of age, then have this test every 5 years.  You may need to have your cholesterol levels checked more often if:  Your lipid or cholesterol levels are high.  You are older than 58 years of age.  You are at high risk for heart disease. Cancer screening Lung Cancer  Lung cancer screening is recommended for adults 58-39 years old who are at high risk for lung cancer because of a history of smoking.  A yearly low-dose CT scan of the lungs is recommended for people who:  Currently smoke.  Have quit within the past 15 years.  Have at least a 30-pack-year history of smoking. A pack year is smoking an average of one pack of cigarettes a day for 1 year.  Yearly screening should continue until it has been 15 years since you quit.  Yearly screening should stop if you develop a health problem that would prevent you from having lung cancer treatment. Breast Cancer  Practice breast self-awareness. This means understanding how your breasts normally appear and feel.  It also means doing regular breast self-exams. Let your health care provider know about any changes, no matter how small.  If you are in your 20s or 30s, you should have a clinical breast exam (CBE) by a health care provider every 1-3 years as part of a regular health exam.  If you are 40  or older, have a CBE every year. Also consider having a breast X-ray (mammogram) every year.  If you have a family history of breast cancer, talk to your health care provider about genetic screening.  If you are at high risk for breast cancer, talk to your health care provider about having an MRI and a mammogram every year.  Breast cancer gene (BRCA) assessment is recommended for women who have family members with BRCA-related cancers. BRCA-related cancers include:  Breast.  Ovarian.  Tubal.  Peritoneal cancers.  Results of  the assessment will determine the need for genetic counseling and BRCA1 and BRCA2 testing. Cervical Cancer  Your health care provider may recommend that you be screened regularly for cancer of the pelvic organs (ovaries, uterus, and vagina). This screening involves a pelvic examination, including checking for microscopic changes to the surface of your cervix (Pap test). You may be encouraged to have this screening done every 3 years, beginning at age 71.  For women ages 24-65, health care providers may recommend pelvic exams and Pap testing every 3 years, or they may recommend the Pap and pelvic exam, combined with testing for human papilloma virus (HPV), every 5 years. Some types of HPV increase your risk of cervical cancer. Testing for HPV may also be done on women of any age with unclear Pap test results.  Other health care providers may not recommend any screening for nonpregnant women who are considered low risk for pelvic cancer and who do not have symptoms. Ask your health care provider if a screening pelvic exam is right for you.  If you have had past treatment for cervical cancer or a condition that could lead to cancer, you need Pap tests and screening for cancer for at least 20 years after your treatment. If Pap tests have been discontinued, your risk factors (such as having a new sexual partner) need to be reassessed to determine if screening should resume. Some women have medical problems that increase the chance of getting cervical cancer. In these cases, your health care provider may recommend more frequent screening and Pap tests. Colorectal Cancer  This type of cancer can be detected and often prevented.  Routine colorectal cancer screening usually begins at 58 years of age and continues through 58 years of age.  Your health care provider may recommend screening at an earlier age if you have risk factors for colon cancer.  Your health care provider may also recommend using home test  kits to check for hidden blood in the stool.  A small camera at the end of a tube can be used to examine your colon directly (sigmoidoscopy or colonoscopy). This is done to check for the earliest forms of colorectal cancer.  Routine screening usually begins at age 80.  Direct examination of the colon should be repeated every 5-10 years through 58 years of age. However, you may need to be screened more often if early forms of precancerous polyps or small growths are found. Skin Cancer  Check your skin from head to toe regularly.  Tell your health care provider about any new moles or changes in moles, especially if there is a change in a mole's shape or color.  Also tell your health care provider if you have a mole that is larger than the size of a pencil eraser.  Always use sunscreen. Apply sunscreen liberally and repeatedly throughout the day.  Protect yourself by wearing long sleeves, pants, a wide-brimmed hat, and sunglasses whenever you are outside. Heart  disease, diabetes, and high blood pressure  High blood pressure causes heart disease and increases the risk of stroke. High blood pressure is more likely to develop in:  People who have blood pressure in the high end of the normal range (130-139/85-89 mm Hg).  People who are overweight or obese.  People who are African American.  If you are 31-59 years of age, have your blood pressure checked every 3-5 years. If you are 8 years of age or older, have your blood pressure checked every year. You should have your blood pressure measured twice-once when you are at a hospital or clinic, and once when you are not at a hospital or clinic. Record the average of the two measurements. To check your blood pressure when you are not at a hospital or clinic, you can use:  An automated blood pressure machine at a pharmacy.  A home blood pressure monitor.  If you are between 78 years and 44 years old, ask your health care provider if you should  take aspirin to prevent strokes.  Have regular diabetes screenings. This involves taking a blood sample to check your fasting blood sugar level.  If you are at a normal weight and have a low risk for diabetes, have this test once every three years after 58 years of age.  If you are overweight and have a high risk for diabetes, consider being tested at a younger age or more often. Preventing infection Hepatitis B  If you have a higher risk for hepatitis B, you should be screened for this virus. You are considered at high risk for hepatitis B if:  You were born in a country where hepatitis B is common. Ask your health care provider which countries are considered high risk.  Your parents were born in a high-risk country, and you have not been immunized against hepatitis B (hepatitis B vaccine).  You have HIV or AIDS.  You use needles to inject street drugs.  You live with someone who has hepatitis B.  You have had sex with someone who has hepatitis B.  You get hemodialysis treatment.  You take certain medicines for conditions, including cancer, organ transplantation, and autoimmune conditions. Hepatitis C  Blood testing is recommended for:  Everyone born from [birth year] through 1963.  Anyone with known risk factors for hepatitis C. Sexually transmitted infections (STIs)  You should be screened for sexually transmitted infections (STIs) including gonorrhea and chlamydia if:  You are sexually active and are younger than 58 years of age.  You are older than 58 years of age and your health care provider tells you that you are at risk for this type of infection.  Your sexual activity has changed since you were last screened and you are at an increased risk for chlamydia or gonorrhea. Ask your health care provider if you are at risk.  If you do not have HIV, but are at risk, it may be recommended that you take a prescription medicine daily to prevent HIV infection. This is called  pre-exposure prophylaxis (PrEP). You are considered at risk if:  You are sexually active and do not regularly use condoms or know the HIV status of your partner(s).  You take drugs by injection.  You are sexually active with a partner who has HIV. Talk with your health care provider about whether you are at high risk of being infected with HIV. If you choose to begin PrEP, you should first be tested for HIV. You should then be  tested every 3 months for as long as you are taking PrEP. Pregnancy  If you are premenopausal and you may become pregnant, ask your health care provider about preconception counseling.  If you may become pregnant, take 400 to 800 micrograms (mcg) of folic acid every day.  If you want to prevent pregnancy, talk to your health care provider about birth control (contraception). Osteoporosis and menopause  Osteoporosis is a disease in which the bones lose minerals and strength with aging. This can result in serious bone fractures. Your risk for osteoporosis can be identified using a bone density scan.  If you are 65 years of age or older, or if you are at risk for osteoporosis and fractures, ask your health care provider if you should be screened.  Ask your health care provider whether you should take a calcium or vitamin D supplement to lower your risk for osteoporosis.  Menopause may have certain physical symptoms and risks.  Hormone replacement therapy may reduce some of these symptoms and risks. Talk to your health care provider about whether hormone replacement therapy is right for you. Follow these instructions at home:  Schedule regular health, dental, and eye exams.  Stay current with your immunizations.  Do not use any tobacco products including cigarettes, chewing tobacco, or electronic cigarettes.  If you are pregnant, do not drink alcohol.  If you are breastfeeding, limit how much and how often you drink alcohol.  Limit alcohol intake to no more  than 1 drink per day for nonpregnant women. One drink equals 12 ounces of beer, 5 ounces of wine, or 1 ounces of hard liquor.  Do not use street drugs.  Do not share needles.  Ask your health care provider for help if you need support or information about quitting drugs.  Tell your health care provider if you often feel depressed.  Tell your health care provider if you have ever been abused or do not feel safe at home. This information is not intended to replace advice given to you by your health care provider. Make sure you discuss any questions you have with your health care provider. Document Released: 01/24/2011 Document Revised: 12/17/2015 Document Reviewed: 04/14/2015  2017 [employer]vier

## 2016-06-20 NOTE — Assessment & Plan Note (Signed)

## 2016-06-20 NOTE — Progress Notes (Signed)
BP 110/70 (BP Location: Right Arm, Patient Position: Sitting, Cuff Size: Normal)   Pulse 89   Temp 98.3 F (36.8 C) (Oral)   Resp 16   Ht 5\' 4"  (1.626 m)   Wt 133 lb 8 oz (60.6 kg)   LMP 07/25/1998   SpO2 98%   BMI 22.92 kg/m    CC: CPE Subjective:    Patient ID: Rachel Duran, female    DOB: [birth year], 58 y.o.   MRN: [id number]  HPI: Rachel Duran is a 60 y.o. 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   Preventative: Flex sigmoidoscopy - 11/2006 [facility]) rec rpt 10 yrs. Planning on scheduling colonoscopy at beginning of year.  Well woman with Dr Josefa Half OBGYN seen 12/2015. Mammogram WNL 09/2015 dexa - 2014 with mild osteopenia per Dr Quincy Simmonds Flu shot - yearly pneumova 2011, prevnar 2015 Td 2007 Advanced care planning - HCPOA scanned into chart 06/2015. HCPOA is husband Autherine Nitka. Does not want prolonged life support if terminally ill. Seat belt use discussed  Sunscreen use discussed. No changing moles. Sees derm. 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 Interim medical history since our last visit reviewed. Allergies and medications reviewed and updated. Current Outpatient Prescriptions on File Prior to Visit  Medication Sig  . acyclovir (ZOVIRAX) 800 MG tablet Take one-half tablet by  mouth every day  . ALPRAZolam (XANAX) 1 MG tablet Take 1 tablet (1 mg total) by mouth 3 (three) times daily as needed for anxiety. With 1/2 tab at night time  . amLODipine (NORVASC) 5 MG tablet Take 1 tablet (5 mg total) by mouth daily.  Marland Kitchen amphetamine-dextroamphetamine (ADDERALL XR) 30 MG 24 hr capsule Take 1 capsule (30 mg total) by mouth every morning.  Marland Kitchen  aspirin EC 81 MG tablet Take 81 mg by mouth every other day.   . Calcium-Magnesium-Vitamin D [id number] MG-MG-UNIT TB24 Take 1 tablet by mouth 2 (two) times daily.  Marland Kitchen co-enzyme Q-10 50 MG capsule Take 50 mg by mouth daily.  . cyanocobalamin (,VITAMIN B-12,) 1000 MCG/ML injection Inject 1 mL (1,000 mcg total) into the muscle every 21 ( twenty-one) days.  . diclofenac sodium (VOLTAREN) 1 % GEL Apply 1 g topically 2 (two) times daily as needed (pain).   . Estradiol (YUVAFEM) 10 MCG TABS vaginal tablet Insert 1 tablet (10 mcg  total) vaginally 3 (three)  times a week  . Eszopiclone 3 MG TABS TAKE ONE TABLET BY MOUTH AT BEDTIME AS NEEDED *TAKE IMMEDIATELY BEFORE BEDTIME*  . ferrous sulfate 325 (65 FE) MG tablet Take 1 tablet (325 mg total) by mouth every Monday, Wednesday, and Friday.  . hydrocortisone (ANUSOL-HC) 25 MG suppository Place 25 mg rectally 2 (two) times daily as needed for hemorrhoids or itching.  . Indomethacin POWD by Does not apply route 3 (three) times daily.  Marland Kitchen lamoTRIgine (LAMICTAL) 150 MG tablet Take 150 mg by mouth daily.   Marland Kitchen linaclotide (LINZESS) 290 MCG CAPS capsule Take 1 capsule (290 mcg total) by mouth daily.  Marland Kitchen lithium carbonate (ESKALITH) 450 MG CR tablet 450 mg.   . MAGNESIUM PO Take 1,000 mg by mouth 3 (three) times daily.  . Manganese 50 MG TABS Take  50 mg by mouth daily.   . Morphine-Naltrexone 80-3.2 MG CPCR Take 1 tablet by mouth daily.  . Multiple Vitamins-Minerals (ICAPS AREDS 2) CAPS Take 1 capsule by mouth daily.  . NONFORMULARY OR COMPOUNDED ITEM Testosterone propionate 2% in white petrolatum, apply once daily for 5 days per week and twice a day for 2 days per week.  Dispense:  60 grams.  . ondansetron (ZOFRAN) 4 MG tablet Take 1 tablet (4 mg total) by mouth every 8 (eight) hours as needed for nausea or vomiting.  . pentosan polysulfate (ELMIRON) 100 MG capsule Take 200 mg by mouth 2 (two) times daily.   . Polyethylene Glycol 3350 (MIRALAX PO) Take 17 g by mouth  4 (four) times daily.   . pregabalin (LYRICA) 150 MG capsule Take 150 mg by mouth 3 (three) times daily.   . ursodiol (ACTIGALL) 300 MG capsule TAKE ONE TABLET BY MOUTH TWICE DAILY  . fluticasone (FLONASE) 50 MCG/ACT nasal spray 2 SPRAYS INTO BOTH NOSTRILS DAILY   No current facility-administered medications on file prior to visit.     Review of Systems  Constitutional: Negative for activity change, appetite change, chills, fatigue, fever and unexpected weight change.  HENT: Negative for hearing loss.   Eyes: Negative for visual disturbance.  Respiratory: Negative for cough, chest tightness, shortness of breath and wheezing.   Cardiovascular: Negative for chest pain, palpitations and leg swelling.  Gastrointestinal: Positive for nausea. Negative for abdominal distention, abdominal pain, blood in stool, constipation, diarrhea and vomiting.  Genitourinary: Negative for difficulty urinating and hematuria.  Musculoskeletal: Negative for arthralgias, myalgias and neck pain.  Skin: Negative for rash.  Neurological: Positive for headaches (stress related). Negative for dizziness, seizures and syncope.  Hematological: Negative for adenopathy. Does not bruise/bleed easily.  Psychiatric/Behavioral: Positive for dysphoric mood. The patient is nervous/anxious.    Per HPI unless specifically indicated in ROS section     Objective:    BP 110/70 (BP Location: Right Arm, Patient Position: Sitting, Cuff Size: Normal)   Pulse 89   Temp 98.3 F (36.8 C) (Oral)   Resp 16   Ht 5\' 4"  (1.626 m)   Wt 133 lb 8 oz (60.6 kg)   LMP 07/25/1998   SpO2 98%   BMI 22.92 kg/m   Wt Readings from Last 3 Encounters:  06/20/16 133 lb 8 oz (60.6 kg)  06/06/16 133 lb 12.8 oz (60.7 kg)  01/08/16 130 lb (59 kg)    Physical Exam  Constitutional: She is oriented to person, place, and time. She appears well-developed and well-nourished. No distress.  HENT:  Head: Normocephalic and atraumatic.  Right Ear: Hearing,  tympanic membrane, external ear and ear canal normal.  Left Ear: Hearing, tympanic membrane, external ear and ear canal normal.  Nose: Nose normal.  Mouth/Throat: Uvula is midline, oropharynx is clear and moist and mucous membranes are normal. No oropharyngeal exudate, posterior oropharyngeal edema or posterior oropharyngeal erythema.  Eyes: Conjunctivae and EOM are normal. Pupils are equal, round, and reactive to light. No scleral icterus.  Neck: Normal range of motion. Neck supple. Carotid bruit is not present. No thyromegaly present.  Cardiovascular: Normal rate, regular rhythm, normal heart sounds and intact distal pulses.   No murmur heard. Pulses:      Radial pulses are 2+ on the right side, and 2+ on the left side.  Pulmonary/Chest: Effort normal and breath sounds normal. No respiratory distress. She has no wheezes. She has no rales.  Abdominal: Soft. Bowel sounds are normal.  She exhibits no distension and no mass. There is no tenderness. There is no rebound and no guarding.  Musculoskeletal: Normal range of motion. She exhibits no edema.  Lymphadenopathy:    She has no cervical adenopathy.  Neurological: She is alert and oriented to person, place, and time.  CN grossly intact, station and gait intact  Skin: Skin is warm and dry. No rash noted.  Psychiatric: She has a normal mood and affect. Her behavior is normal. Judgment and thought content normal.  Nursing note and vitals reviewed.  Results for orders placed or performed in visit on 06/14/16  Ferritin  Result Value Ref Range   Ferritin 31.0 10.0 - 291.0 ng/mL  IBC panel  Result Value Ref Range   Iron 110 42 - 145 ug/dL   Transferrin 299.0 212.0 - 360.0 mg/dL   Saturation Ratios 26.3 20.0 - 50.0 %  Vitamin B12  Result Value Ref Range   Vitamin B-12 1,055 (H) 211 - 911 pg/mL  Basic metabolic panel  Result Value Ref Range   Sodium 140 135 - 145 mEq/L   Potassium 5.0 3.5 - 5.1 mEq/L   Chloride 105 96 - 112 mEq/L   CO2 27  19 - 32 mEq/L   Glucose, Bld 144 (H) 70 - 99 mg/dL   BUN 8 6 - 23 mg/dL   Creatinine, Ser 0.72 0.40 - 1.20 mg/dL   Calcium 9.9 8.4 - 10.5 mg/dL   GFR 88.31 >60.00 mL/min  Hemoglobin A1c  Result Value Ref Range   Hgb A1c MFr Bld 5.3 4.6 - 6.5 %  CBC with Differential/Platelet  Result Value Ref Range   WBC 12.1 (H) 4.0 - 10.5 K/uL   RBC 4.40 3.87 - 5.11 Mil/uL   Hemoglobin 12.6 12.0 - 15.0 g/dL   HCT 38.4 36.0 - 46.0 %   MCV 87.2 78.0 - 100.0 fl   MCHC 32.9 30.0 - 36.0 g/dL   RDW 14.3 11.5 - 15.5 %   Platelets 323.0 150.0 - 400.0 K/uL   Neutrophils Relative % 79.2 (H) 43.0 - 77.0 %   Lymphocytes Relative 14.5 12.0 - 46.0 %   Monocytes Relative 4.7 3.0 - 12.0 %   Eosinophils Relative 1.2 0.0 - 5.0 %   Basophils Relative 0.4 0.0 - 3.0 %   Neutro Abs 9.6 (H) 1.4 - 7.7 K/uL   Lymphs Abs 1.7 0.7 - 4.0 K/uL   Monocytes Absolute 0.6 0.1 - 1.0 K/uL   Eosinophils Absolute 0.1 0.0 - 0.7 K/uL   Basophils Absolute 0.0 0.0 - 0.1 K/uL  TSH  Result Value Ref Range   TSH 2.41 0.35 - 4.50 uIU/mL  T4, free  Result Value Ref Range   Free T4 0.68 0.60 - 1.60 ng/dL  Sedimentation rate  Result Value Ref Range   Sed Rate 1 0 - 30 mm/hr  Cyclic citrul peptide antibody, IgG  Result Value Ref Range   Cyclic Citrullin Peptide Ab <16 Units  Sjogren's syndrome antibods(ssa + ssb)  Result Value Ref Range   SSA ([place]) (ENA) Antibody, IgG <1.0 NEG <1.0 NEG AI   SSB ([place]) (ENA) Antibody, IgG <1.0 NEG <1.0 NEG AI      Assessment & Plan:   Problem List Items Addressed This Visit    Advanced care planning/counseling discussion    Advanced care planning - HCPOA scanned into chart 06/2015. HCPOA is husband Zelaya Kulas. Does not want prolonged life support if terminally ill.      CFS (chronic fatigue syndrome)  Chronic pain syndrome    Tramadol refilled.       Dry mouth    sjogren eval negative.  ?med vs stress related.       Health maintenance examination    Preventative protocols reviewed and  updated unless pt declined. Discussed healthy diet and lifestyle.       Iron deficiency    Discussed continue iron MWF, try with OJ or vit C      MDD (major depressive disorder), recurrent episode, moderate ([place])    Followed by Dr Toy Care.       Medicare annual wellness visit, subsequent - Primary    I have personally reviewed the Medicare Annual Wellness questionnaire and have noted 1. The patient's medical and social history 2. Their use of alcohol, tobacco or illicit drugs 3. Their current medications and supplements 4. The patient's functional ability including ADL's, fall risks, home safety risks and hearing or visual impairment. Cognitive function has been assessed and addressed as indicated.  5. Diet and physical activity 6. Evidence for depression or mood disorders The patients weight, height, BMI have been recorded in the chart. I have made referrals, counseling and provided education to the patient based on review of the above and I have provided the pt with a written personalized care plan for preventive services. Provider list updated.. See scanned questionairre as needed for further documentation. Reviewed preventative protocols and updated unless pt declined.       Seronegative arthritis    Anti CCP neg      Relevant Medications   traMADol (ULTRAM) 50 MG tablet   Stargardt's disease       Follow up plan: Return in about 4 months (around 10/18/2016) for follow up visit.  Ria Bush, MD

## 2016-06-20 NOTE — Assessment & Plan Note (Signed)
Anti CCP neg

## 2016-06-20 NOTE — Assessment & Plan Note (Signed)
Advanced care planning - HCPOA scanned into chart 06/2015. HCPOA is husband David Plouffe. Does not want prolonged life support if terminally ill.  ?

## 2016-06-20 NOTE — Telephone Encounter (Signed)
Informed patient that I sent rhe refill for 90 day supply to her pharmacy and she is due for follow visit in March. Patient verbalized understanding.

## 2016-06-20 NOTE — Assessment & Plan Note (Signed)
Tramadol refilled.

## 2016-06-20 NOTE — Progress Notes (Signed)
Pre visit review using our clinic review tool, if applicable. No additional management support is needed unless otherwise documented below in the visit note/SLS  

## 2016-06-22 ENCOUNTER — Other Ambulatory Visit: Payer: Self-pay | Admitting: Radiology

## 2016-06-22 ENCOUNTER — Encounter: Payer: Self-pay | Admitting: Family Medicine

## 2016-06-22 MED ORDER — PREGABALIN 150 MG PO CAPS
150.0000 mg | ORAL_CAPSULE | Freq: Three times a day (TID) | ORAL | 1 refills | Status: DC
Start: 2016-06-22 — End: 2016-08-30

## 2016-06-22 NOTE — Telephone Encounter (Signed)
Last visit 04/05/16 Next visit 10/07/16 Ok to refill Lyrica? Pended to refill same as previous 150 tid

## 2016-06-22 NOTE — Telephone Encounter (Signed)
Refill request received via fax for Lyrica [facility]

## 2016-06-24 MED ORDER — TRAMADOL HCL 50 MG PO TABS: 50.0000 mg | ORAL_TABLET | Freq: Two times a day (BID) | ORAL | 0 refills | Status: DC | PRN

## 2016-06-24 NOTE — Telephone Encounter (Signed)
Pt spouse came in and picked up.

## 2016-06-24 NOTE — Telephone Encounter (Signed)
Ultram Rx printed and in [facility] box.

## 2016-06-28 ENCOUNTER — Other Ambulatory Visit: Payer: Self-pay

## 2016-06-28 MED ORDER — ESOMEPRAZOLE MAGNESIUM 40 MG PO CPDR: 40.0000 mg | DELAYED_RELEASE_CAPSULE | Freq: Every day | ORAL | 0 refills | Status: DC

## 2016-06-28 NOTE — Telephone Encounter (Signed)
Rachel Duran with [facility] left v/m requesting refill early for lunesta; last refilled # 90 on 04/21/16; pt last seen annual 06/20/16. Pt is wanting to get another 90 day supply before end of year; Altha Harm said they can fill couple of days before end of year and will profile until time to refill. Please advise.

## 2016-06-28 NOTE — Telephone Encounter (Signed)
plz phone in. 

## 2016-06-29 MED ORDER — ESZOPICLONE 3 MG PO TABS: ORAL_TABLET | ORAL | 0 refills | Status: DC

## 2016-06-29 NOTE — Addendum Note (Signed)
Addended by: Royann Shivers A on: 06/29/2016 09:37 AM   Modules accepted: Orders

## 2016-06-29 NOTE — Telephone Encounter (Signed)
Lunesta called in. Not nexium :)

## 2016-07-05 ENCOUNTER — Ambulatory Visit (INDEPENDENT_AMBULATORY_CARE_PROVIDER_SITE_OTHER): Payer: Medicare Other | Admitting: Family Medicine

## 2016-07-05 ENCOUNTER — Encounter: Payer: Self-pay | Admitting: Family Medicine

## 2016-07-05 VITALS — BP 120/76 | HR 85 | Temp 98.3°F | Ht 64.0 in | Wt 134.8 lb

## 2016-07-05 DIAGNOSIS — N3010 Interstitial cystitis (chronic) without hematuria: Secondary | ICD-10-CM

## 2016-07-05 DIAGNOSIS — R3915 Urgency of urination: Secondary | ICD-10-CM | POA: Insufficient documentation

## 2016-07-05 LAB — POC URINALSYSI DIPSTICK (AUTOMATED)
BILIRUBIN UA: NEGATIVE
Blood, UA: NEGATIVE
GLUCOSE UA: NEGATIVE
Ketones, UA: NEGATIVE
NITRITE UA: NEGATIVE
Protein, UA: NEGATIVE
SPEC GRAV UA: 1.015
UROBILINOGEN UA: 0.2
pH, UA: 7

## 2016-07-05 LAB — POCT UA - MICROSCOPIC ONLY
Bacteria, U Microscopic: 0
Crystals, Ur, HPF, POC: 0
EPITHELIAL CELLS, URINE PER MICROSCOPY: 0
MUCUS UA: 0
RBC, URINE, MICROSCOPIC: 0

## 2016-07-05 NOTE — Patient Instructions (Signed)
Push fluids.  We will call you with urine culture results.  See urologist for further treatments of IC if not improving.

## 2016-07-05 NOTE — Progress Notes (Signed)
Pre visit review using our clinic review tool, if applicable. No additional management support is needed unless otherwise documented below in the visit note. 

## 2016-07-05 NOTE — Assessment & Plan Note (Signed)
Current UA, micro appears clear.. Will send for culture given pt history of small LE in UA. Most liekly due to flare of IC.Marland Kitchen Refer back to urologist for treatments.

## 2016-07-05 NOTE — Addendum Note (Signed)
Addended by: Carter Kitten on: 07/05/2016 10:14 AM   Modules accepted: Orders

## 2016-07-05 NOTE — Progress Notes (Signed)
   Subjective:    Patient ID: Rachel Duran, female    DOB: 03-23-1956, 60 y.o.   MRN: [id number]  Urinary Frequency   The current episode started in the past 7 days (5 days). The problem has been waxing and waning. Quality: no pain. Pain severity now: urinary pressure. There has been no fever. She is sexually active. There is no history of pyelonephritis. Associated symptoms include frequency and hematuria. She has tried increased fluids for the symptoms. The treatment provided mild relief. Her past medical history is significant for recurrent UTIs. There is no history of catheterization, kidney stones, a single kidney, urinary stasis or a urological procedure. hx of interstitial cystitis      Review of Systems  Constitutional: Negative for fatigue and fever.  HENT: Negative for ear pain.   Eyes: Negative for pain.  Respiratory: Negative for chest tightness and shortness of breath.   Cardiovascular: Negative for chest pain, palpitations and leg swelling.  Gastrointestinal: Negative for abdominal pain.  Genitourinary: Positive for frequency and hematuria. Negative for dysuria.       Objective:   Physical Exam  Constitutional: Vital signs are normal. She appears well-developed and well-nourished. She is cooperative.  Non-toxic appearance. She does not appear ill. No distress.  HENT:  Head: Normocephalic.  Right Ear: Hearing, tympanic membrane, external ear and ear canal normal. Tympanic membrane is not erythematous, not retracted and not bulging.  Left Ear: Hearing, tympanic membrane, external ear and ear canal normal. Tympanic membrane is not erythematous, not retracted and not bulging.  Nose: No mucosal edema or rhinorrhea. Right sinus exhibits no maxillary sinus tenderness and no frontal sinus tenderness. Left sinus exhibits no maxillary sinus tenderness and no frontal sinus tenderness.  Mouth/Throat: Uvula is midline, oropharynx is clear and moist and mucous membranes are normal.    Eyes: Conjunctivae, EOM and lids are normal. Pupils are equal, round, and reactive to light. Lids are everted and swept, no foreign bodies found.  Neck: Trachea normal and normal range of motion. Neck supple. Carotid bruit is not present. No thyroid mass and no thyromegaly present.  Cardiovascular: Normal rate, regular rhythm, S1 normal, S2 normal, normal heart sounds, intact distal pulses and normal pulses.  Exam reveals no gallop and no friction rub.   No murmur heard. Pulmonary/Chest: Effort normal and breath sounds normal. No tachypnea. No respiratory distress. She has no decreased breath sounds. She has no wheezes. She has no rhonchi. She has no rales.  Abdominal: Soft. Normal appearance and bowel sounds are normal. There is no hepatosplenomegaly. There is tenderness in the right lower quadrant, suprapubic area and left lower quadrant. There is no rigidity, no rebound, no guarding and no CVA tenderness.  Neurological: She is alert.  Skin: Skin is warm, dry and intact. No rash noted.  Psychiatric: Her speech is normal and behavior is normal. Judgment and thought content normal. Her mood appears not anxious. Cognition and memory are normal. She does not exhibit a depressed mood.          Assessment & Plan:

## 2016-07-07 LAB — URINE CULTURE: ORGANISM ID, BACTERIA: NO GROWTH

## 2016-07-19 ENCOUNTER — Other Ambulatory Visit: Payer: Self-pay | Admitting: Family Medicine

## 2016-08-09 ENCOUNTER — Telehealth: Payer: Self-pay | Admitting: Obstetrics and Gynecology

## 2016-08-09 NOTE — Telephone Encounter (Signed)
Patient calling to speak with nurse about a prescription that was denied. 

## 2016-08-09 NOTE — Telephone Encounter (Addendum)
Spoke with patient. Patient states that she went to pick up her compounded testosterone and was advised her rx has expired. Patient was given rx for Testosterone propionate 2% in white petrolatum, apply once daily for 5 days per week and twice a day for 2 days per week. Dispense: 60 grams 5RF on 01/08/2016. Patient is asking if new rx can be sent to [facility] on file. States she is behind on picking rx up because she had surgery and moved at the end of the year and was not using this medication at that time. Advised will review with provider and return call. Spoke with [facility] and rx was only good for 6 months which ended 06/2016.  Cc: Dr.Silva  Dr.Miller, okay to refill rx at this time until patient's next aex?

## 2016-08-10 NOTE — Telephone Encounter (Signed)
Please confirm the surgery that the patient had. Was this her cervical spine surgery? If this is the case, I am ok with giving the patient a refill of her testosterone as previously prescribed. Dispense:  60 grams, RF none.

## 2016-08-12 MED ORDER — NONFORMULARY OR COMPOUNDED ITEM: 0 refills | Status: DC

## 2016-08-12 NOTE — Telephone Encounter (Signed)
Left message to call Rigley Niess at [phone]. 

## 2016-08-12 NOTE — Telephone Encounter (Signed)
Spoke with patient. Patient states the surgery she had in August 2017 was for her cervical spine. Advised will send in rx for Testosterone at this time to [facility]. Patient is agreeable. Rx for Testosterone propionate 2 % in white petrolatum, apply once daily for 5 days per week and twice a day for 2 days per week #60 0RF printed and to Dr.Silva for review and signature for fax.

## 2016-08-12 NOTE — Telephone Encounter (Signed)
Signed letter faxed to [facility] with cover sheet and confirmation at [phone].  Routing to provider for final review. Patient agreeable to disposition. Will close encounter.

## 2016-08-14 ENCOUNTER — Other Ambulatory Visit: Payer: Self-pay | Admitting: Family Medicine

## 2016-08-18 ENCOUNTER — Ambulatory Visit (INDEPENDENT_AMBULATORY_CARE_PROVIDER_SITE_OTHER): Payer: Medicare Other | Admitting: Family Medicine

## 2016-08-18 ENCOUNTER — Encounter: Payer: Self-pay | Admitting: Family Medicine

## 2016-08-18 VITALS — BP 122/70 | HR 89 | Temp 98.1°F | Wt 132.8 lb

## 2016-08-18 DIAGNOSIS — K120 Recurrent oral aphthae: Secondary | ICD-10-CM

## 2016-08-18 DIAGNOSIS — B001 Herpesviral vesicular dermatitis: Secondary | ICD-10-CM | POA: Diagnosis not present

## 2016-08-18 DIAGNOSIS — R682 Dry mouth, unspecified: Secondary | ICD-10-CM | POA: Diagnosis not present

## 2016-08-18 MED ORDER — VALACYCLOVIR HCL 1 G PO TABS: 2000.0000 mg | ORAL_TABLET | Freq: Two times a day (BID) | ORAL | 0 refills | Status: DC

## 2016-08-18 MED ORDER — MAGIC MOUTHWASH W/LIDOCAINE: 5.0000 mL | Freq: Three times a day (TID) | ORAL | 0 refills | Status: DC | PRN

## 2016-08-18 NOTE — Progress Notes (Signed)
BP 122/70   Pulse 89   Temp 98.1 F (36.7 C) (Oral)   Wt 132 lb 12 oz (60.2 kg)   LMP 07/25/1998   SpO2 95%   BMI 22.79 kg/m    CC: mouth sores Subjective:    Patient ID: Rachel Duran, female    DOB: Dec 19, 1955, 61 y.o.   MRN: [id number]  HPI: Rachel Duran is a 61 y.o. female presenting on 08/18/2016 for Sinusitis (ST,Ears,swollen glands,bumps on toungue and cheek)   Ongoing sores in mouth since summer, as well as canker sores on cheeks, bumps on tongue, and fever blisters. Gargling with salt water. Still feeling overwhelmed with recent move. Worsening over the past month. Uses colgate sensitive toothpaste. She normally uses biotene but without improvement at this time. Brushing teeth with sensitive tooth brush and tooth paste causes ongoing burning.   No new foods, new products. She does not use alcohol based mouthwash, just boitene Lithium started ~11/2015  ST with earache that has improved today. She thinks this is coming from mouth irritation.   Previously improved with acyclovir and magic mouthwash.   Saw Dr [facility] - thought anxiety not depression, continued lithium and xanax, added abilify (which caused oversedation).   Trouble swallowing acyclovir tablets after cervical neck surgery.   Relevant past medical, surgical, family and social history reviewed and updated as indicated. Interim medical history since our last visit reviewed. Allergies and medications reviewed and updated. Current Outpatient Prescriptions on File Prior to Visit  Medication Sig  . acyclovir (ZOVIRAX) 800 MG tablet Take one-half tablet by  mouth every day  . ALPRAZolam (XANAX) 1 MG tablet Take 1 tablet (1 mg total) by mouth 3 (three) times daily as needed for anxiety. With 1/2 tab at night time  . amLODipine (NORVASC) 5 MG tablet TAKE 1 TABLET BY MOUTH  DAILY  . amphetamine-dextroamphetamine (ADDERALL XR) 30 MG 24 hr capsule Take 1 capsule (30 mg total) by mouth every morning.  . ARIPiprazole  (ABILIFY) 5 MG tablet Take 5 mg by mouth daily.   Marland Kitchen aspirin EC 81 MG tablet Take 81 mg by mouth every other day.   . Calcium-Magnesium-Vitamin D [id number] MG-MG-UNIT TB24 Take 1 tablet by mouth 2 (two) times daily.  Marland Kitchen co-enzyme Q-10 50 MG capsule Take 50 mg by mouth daily.  . cyanocobalamin (,VITAMIN B-12,) 1000 MCG/ML injection Inject 1 mL (1,000 mcg total) into the muscle every 21 ( twenty-one) days.  . diclofenac sodium (VOLTAREN) 1 % GEL Apply 1 g topically 2 (two) times daily as needed (pain).   Marland Kitchen esomeprazole (NEXIUM) 40 MG capsule Take 1 capsule (40 mg total) by mouth daily at 12 noon.  . Estradiol (YUVAFEM) 10 MCG TABS vaginal tablet Insert 1 tablet (10 mcg  total) vaginally 3 (three)  times a week  . Eszopiclone 3 MG TABS TAKE ONE TABLET BY MOUTH AT BEDTIME AS NEEDED *TAKE IMMEDIATELY BEFORE BEDTIME*  . ferrous sulfate 325 (65 FE) MG tablet Take 1 tablet (325 mg total) by mouth every Monday, Wednesday, and Friday.  . fluticasone (FLONASE) 50 MCG/ACT nasal spray 2 SPRAYS INTO BOTH NOSTRILS DAILY  . hydrocortisone (ANUSOL-HC) 25 MG suppository Place 25 mg rectally 2 (two) times daily as needed for hemorrhoids or itching.  . Indomethacin POWD by Does not apply route 3 (three) times daily.  Marland Kitchen lamoTRIgine (LAMICTAL) 200 MG tablet Take 200 mg by mouth daily.   Marland Kitchen linaclotide (LINZESS) 290 MCG CAPS capsule Take 1 capsule (290 mcg total) by  mouth daily.  Marland Kitchen lithium carbonate (ESKALITH) 450 MG CR tablet 450 mg.   . MAGNESIUM PO Take 1,000 mg by mouth 3 (three) times daily.  . Manganese 50 MG TABS Take 50 mg by mouth daily.   . Morphine-Naltrexone 80-3.2 MG CPCR Take 1 tablet by mouth daily.  . Multiple Vitamins-Minerals (ICAPS AREDS 2) CAPS Take 1 capsule by mouth daily.  . NONFORMULARY OR COMPOUNDED ITEM Testosterone propionate 2% in white petrolatum, apply once daily for 5 days per week and twice a day for 2 days per week.  Dispense:  60 grams.  . ondansetron (ZOFRAN) 4 MG tablet Take 1 tablet  (4 mg total) by mouth every 8 (eight) hours as needed for nausea or vomiting.  . pentosan polysulfate (ELMIRON) 100 MG capsule Take 200 mg by mouth 2 (two) times daily.   . Polyethylene Glycol 3350 (MIRALAX PO) Take 17 g by mouth 4 (four) times daily.   . pregabalin (LYRICA) 150 MG capsule Take 1 capsule (150 mg total) by mouth 3 (three) times daily.  . traMADol (ULTRAM) 50 MG tablet Take 1 tablet (50 mg total) by mouth 2 (two) times daily as needed.  . ursodiol (ACTIGALL) 300 MG capsule TAKE ONE TABLET BY MOUTH TWICE DAILY   No current facility-administered medications on file prior to visit.     Review of Systems Per HPI unless specifically indicated in ROS section     Objective:    BP 122/70   Pulse 89   Temp 98.1 F (36.7 C) (Oral)   Wt 132 lb 12 oz (60.2 kg)   LMP 07/25/1998   SpO2 95%   BMI 22.79 kg/m   Wt Readings from Last 3 Encounters:  08/18/16 132 lb 12 oz (60.2 kg)  07/05/16 134 lb 12 oz (61.1 kg)  06/20/16 133 lb 8 oz (60.6 kg)    Physical Exam  Constitutional: She appears well-developed and well-nourished. No distress.  HENT:  Head: Normocephalic and atraumatic.  Mouth/Throat: Uvula is midline and oropharynx is clear and moist. Mucous membranes are dry (mild). No oropharyngeal exudate, posterior oropharyngeal edema, posterior oropharyngeal erythema or tonsillar abscesses.  Left lower lip dry blister present without surrounding erythema Dry lips and dry mucous membranes evident Small papules throughout tongue. No significant gingival inflammation present No tooth pain  Eyes: Conjunctivae and EOM are normal. Pupils are equal, round, and reactive to light. No scleral icterus.  Neck: Normal range of motion. Neck supple. No thyromegaly present.  Lymphadenopathy:    She has no cervical adenopathy.  Nursing note and vitals reviewed.   Lab Results  Component Value Date   TSH 2.41 06/14/2016       Assessment & Plan:   Problem List Items Addressed This Visit     APHTHOUS ULCERS   Dry mouth - Primary    For fever blister - sent in valtrex 1d course - rec check with pharmacist to ensure can crush tablets. Prescribed magic mouthwash today for symptomatic relief.  I think this is likely due to drug reaction - latest med lithium, which does have several mucous membrane side effects. If no better with this, will trial 2 wk off lithium (after checking with psych). If no better, f/u with dentist, consider step-wise trial off other meds until causative agent found.  sjogren eval last month negative.      Fever blister   Relevant Medications   valACYclovir (VALTREX) 1000 MG tablet   magic mouthwash w/lidocaine SOLN  Follow up plan: Return if symptoms worsen or fail to improve.  Ria Bush, MD

## 2016-08-18 NOTE — Assessment & Plan Note (Signed)
For fever blister - sent in valtrex 1d course - rec check with pharmacist to ensure can crush tablets. Prescribed magic mouthwash today for symptomatic relief.  I think this is likely due to drug reaction - latest med lithium, which does have several mucous membrane side effects. If no better with this, will trial 2 wk off lithium (after checking with psych). If no better, f/u with dentist, consider step-wise trial off other meds until causative agent found.  sjogren eval last month negative.

## 2016-08-18 NOTE — Patient Instructions (Signed)
Trial valtrex for 1 days (treatment for cold sore) Magic mouth wash sent to pharmacy. If no better symptoms, check with Dr [facility] Care about 2 wk trial off lithium.

## 2016-08-18 NOTE — Progress Notes (Signed)
Pre visit review using our clinic review tool, if applicable. No additional management support is needed unless otherwise documented below in the visit note. 

## 2016-08-29 ENCOUNTER — Encounter: Payer: Self-pay | Admitting: Family Medicine

## 2016-08-30 ENCOUNTER — Other Ambulatory Visit: Payer: Self-pay | Admitting: *Deleted

## 2016-08-30 MED ORDER — LINACLOTIDE 290 MCG PO CAPS: 290.0000 ug | ORAL_CAPSULE | Freq: Every day | ORAL | 6 refills | Status: DC

## 2016-08-30 MED ORDER — MORPHINE-NALTREXONE 80-3.2 MG PO CPCR: 1.0000 | ORAL_CAPSULE | Freq: Every day | ORAL | 0 refills | Status: DC

## 2016-08-30 MED ORDER — AMPHETAMINE-DEXTROAMPHET ER 30 MG PO CP24: 30.0000 mg | ORAL_CAPSULE | ORAL | 0 refills | Status: DC

## 2016-08-30 MED ORDER — PREGABALIN 150 MG PO CAPS: 150.0000 mg | ORAL_CAPSULE | Freq: Three times a day (TID) | ORAL | 6 refills | Status: DC

## 2016-08-30 NOTE — Telephone Encounter (Signed)
She needed Linzess, not Lyrica. I've refilled the Linzess and cancelled the Lyrica. Patient notified that Rx's were ready and placed up front for pick up.

## 2016-08-30 NOTE — Telephone Encounter (Signed)
Rx printed and in Kim's box. I've printed all three - I believe she takes all 3 to [facility]?

## 2016-09-06 ENCOUNTER — Other Ambulatory Visit: Payer: Self-pay | Admitting: Obstetrics and Gynecology

## 2016-09-06 DIAGNOSIS — Z1231 Encounter for screening mammogram for malignant neoplasm of breast: Secondary | ICD-10-CM

## 2016-09-12 ENCOUNTER — Telehealth: Payer: Self-pay

## 2016-09-12 NOTE — Telephone Encounter (Signed)
Patient with a history of bile duct stones.  She c/o 3 week history of burning epigastric pain, nausea and vomiting.  She will come in tomorrow am and see Ellouise Newer, [place]

## 2016-09-13 ENCOUNTER — Encounter: Payer: Self-pay | Admitting: Physician Assistant

## 2016-09-13 ENCOUNTER — Other Ambulatory Visit (INDEPENDENT_AMBULATORY_CARE_PROVIDER_SITE_OTHER): Payer: Medicare Other

## 2016-09-13 ENCOUNTER — Ambulatory Visit (INDEPENDENT_AMBULATORY_CARE_PROVIDER_SITE_OTHER): Payer: Medicare Other | Admitting: Physician Assistant

## 2016-09-13 VITALS — BP 116/74 | HR 76 | Ht 65.0 in | Wt 131.0 lb

## 2016-09-13 DIAGNOSIS — R1310 Dysphagia, unspecified: Secondary | ICD-10-CM

## 2016-09-13 DIAGNOSIS — R1013 Epigastric pain: Secondary | ICD-10-CM | POA: Diagnosis not present

## 2016-09-13 DIAGNOSIS — R110 Nausea: Secondary | ICD-10-CM

## 2016-09-13 DIAGNOSIS — K5900 Constipation, unspecified: Secondary | ICD-10-CM

## 2016-09-13 DIAGNOSIS — K219 Gastro-esophageal reflux disease without esophagitis: Secondary | ICD-10-CM

## 2016-09-13 DIAGNOSIS — K831 Obstruction of bile duct: Secondary | ICD-10-CM | POA: Diagnosis not present

## 2016-09-13 DIAGNOSIS — Z8719 Personal history of other diseases of the digestive system: Secondary | ICD-10-CM | POA: Diagnosis not present

## 2016-09-13 LAB — CBC WITH DIFFERENTIAL/PLATELET
BASOS PCT: 0.5 % (ref 0.0–3.0)
Basophils Absolute: 0 10*3/uL (ref 0.0–0.1)
EOS ABS: 0.1 10*3/uL (ref 0.0–0.7)
Eosinophils Relative: 1 % (ref 0.0–5.0)
HEMATOCRIT: 40.9 % (ref 36.0–46.0)
Hemoglobin: 13.4 g/dL (ref 12.0–15.0)
LYMPHS PCT: 17.2 % (ref 12.0–46.0)
Lymphs Abs: 1.6 10*3/uL (ref 0.7–4.0)
MCHC: 32.7 g/dL (ref 30.0–36.0)
MCV: 85.9 fl (ref 78.0–100.0)
Monocytes Absolute: 0.6 10*3/uL (ref 0.1–1.0)
Monocytes Relative: 6.3 % (ref 3.0–12.0)
NEUTROS ABS: 7.1 10*3/uL (ref 1.4–7.7)
Neutrophils Relative %: 75 % (ref 43.0–77.0)
Platelets: 298 10*3/uL (ref 150.0–400.0)
RBC: 4.76 Mil/uL (ref 3.87–5.11)
RDW: 13.8 % (ref 11.5–15.5)
WBC: 9.4 10*3/uL (ref 4.0–10.5)

## 2016-09-13 LAB — COMPREHENSIVE METABOLIC PANEL
ALT: 12 U/L (ref 0–35)
AST: 16 U/L (ref 0–37)
Albumin: 4.6 g/dL (ref 3.5–5.2)
Alkaline Phosphatase: 89 U/L (ref 39–117)
BUN: 10 mg/dL (ref 6–23)
CALCIUM: 9.6 mg/dL (ref 8.4–10.5)
CO2: 30 meq/L (ref 19–32)
CREATININE: 0.62 mg/dL (ref 0.40–1.20)
Chloride: 104 mEq/L (ref 96–112)
GFR: 104.85 mL/min (ref >60.00)
Glucose, Bld: 112 mg/dL — ABNORMAL HIGH (ref 70–99)
POTASSIUM: 4.8 meq/L (ref 3.5–5.1)
Sodium: 140 mEq/L (ref 135–145)
Total Bilirubin: 0.2 mg/dL (ref 0.2–1.2)
Total Protein: 7.2 g/dL (ref 6.0–8.3)

## 2016-09-13 NOTE — Telephone Encounter (Signed)
Patient has already arrived for the appt.

## 2016-09-13 NOTE — Patient Instructions (Signed)
You have been scheduled for an abdominal ultrasound at [facility] (1st floor of hospital) on 09-16-16 at 8:30 am. Please arrive 15 minutes prior to your appointment for registration. Make certain not to have anything to eat or drink 6 hours prior to your appointment. Should you need to reschedule your appointment, please contact radiology at [phone]. This test typically takes about 30-45 minutes to perform.  Your physician has requested that you go to the basement for lab work before leaving today.

## 2016-09-13 NOTE — Progress Notes (Signed)
Chief Complaint: Abdominal Pain, Dysphagia, H/o choledocholithiasis, Nausea  HPI:  Rachel Duran is a 61 year old Caucasian female with a past medical history of GERD, bile stasis and constipation as well as recurrent iron deficiency and multiple others listed below, who presents to clinic today with a complaint of abdominal pain, nausea, dysphagia and reflux.   Please recall patient follows with Dr. Fuller Plan and has had 2-3 ERCPs for choledocholithiasis in the past. She was most recently seen in our office by Dr. Fuller Plan on 09/29/15 and at that time was doing well on Linzess and MiraLAX for constipation and had not undergone ERCP since 2015 with extension of her sphincterotomy and removal of bile duct stones. It was discussed that she had never had a screening colonoscopy, only a flexible sigmoidoscopy and she was scheduled for one at that time.   Today, the patient tells me that she started with epigastric abdominal pain that sometimes feels like a "hot knife is stabbing through my stomach" about 3 weeks ago, she rates this pain as a constant 6-7/10, sometimes worse. This has been accompanied by nausea and today she reports a sore throat and "swollen glands under her neck". Patient tells me she did have an episode of vomiting on Saturday and is sure that she needs "an ultrasound and labs", as per usual to check for "stones". Patient continues Ursodiol 300 mg twice a day as previously prescribed.   Patient also tells me today she is aware she needs a screening colonoscopy but had to delay this last year due to problems with a spinal cord injury for which she had surgery in August. Since that surgery she has been having swallowing issues which she was told were normal for a couple of months, but she has continued with them now noting that everything she eats or drinks seems to get stuck on its way down her throat and she has to be "very careful". Itwas recommended by her neurosurgeon that she follow with [place] for  possible EGD. She has had an increase of reflux symptoms over the past few months and reminds me that she was decreased from Nexium 40 mg twice a day to daily last year by her PCP.   Patient tells me today her constipation remains well controlled on Linzess, MiraLAX and fiber.   Patient denies fever, chills, blood in her stool, melena, weight loss, fatigue, anorexia, blood in her stool, melena or symptoms that awaken her at night.  Past Medical History:  Diagnosis Date  . Abdominal pain last 4 months   and nausea also  . Allergy   . Anemia   . 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 C6- C7 -hx. past fusion(bone graft used)  . Cholecystitis   . Chronic pain   . DDD (degenerative disc disease), lumbar 09/2015   dextroscoliosis with multilevel DDD and facet arthrosis most notable for R foraminal disc protrusion L4/5 producing severe R neural foraminal stenosis abutting R L4 nerve root, moderate spinal canal and mild lat recesss and R neural foraminal stenosis L3/4 (MRI)  . Depression    bipolar depression  . Disorders of porphyrin metabolism   . Fibromyalgia   . GERD (gastroesophageal reflux disease)   . Internal hemorrhoids   . Interstitial cystitis 06-06-12   hx.  . Irritable bowel syndrome   . PONV (postoperative nausea and vomiting)    now uses stomach blockers and no ponv  . Raynauds disease    hx.  . Seronegative  arthritis    [name]  . 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 ANTERIOR CERVICAL DECOMP/DISCECTOMY FUSION  2004   C5/6, C6/7  . ANTERIOR CERVICAL DECOMP/DISCECTOMY FUSION  02/2016   C3/4, C4/5 with plating [name])  . BREAST ENHANCEMENT SURGERY  2010  . BREAST IMPLANT EXCHANGE  10/2014   exchange saline implants, B mastopexy/capsulorraphy ([facility])  . BUNIONECTOMY Bilateral yrs ago  . CESAREAN SECTION     x 1  . CHOLECYSTECTOMY   06/11/2012   Procedure: LAPAROSCOPIC CHOLECYSTECTOMY WITH INTRAOPERATIVE CHOLANGIOGRAM;  Surgeon: [name], MD,FACS;  Location: [facility];  Service: General;  Laterality: N/A;  . CYSTOSCOPY    . ERCP  05/22/2012   Procedure: ENDOSCOPIC RETROGRADE CHOLANGIOPANCREATOGRAPHY (ERCP);  Surgeon: [name], MD,FACG;  Location: [facility];  Service: Endoscopy;  Laterality: N/A;  . ERCP N/A 09/17/2013   Procedure: ENDOSCOPIC RETROGRADE CHOLANGIOPANCREATOGRAPHY (ERCP);  Surgeon: [name], MD;  Location: [facility];  Service: Endoscopy;  Laterality: N/A;  . HEMORRHOID BANDING  09-23-13   --Dr. Greer Pickerel  . HERNIA REPAIR     inguinal  . HYSTEROSCOPY W/ ENDOMETRIAL ABLATION    . NASAL SINUS SURGERY     x5    Current Outpatient Prescriptions  Medication Sig Dispense Refill  . acyclovir (ZOVIRAX) 800 MG tablet Take one-half tablet by  mouth every day 45 tablet 3  . ALPRAZolam (XANAX) 1 MG tablet Take 1 tablet (1 mg total) by mouth 3 (three) times daily as needed for anxiety. With 1/2 tab at night time 60 tablet 0  . amLODipine (NORVASC) 5 MG tablet TAKE 1 TABLET BY MOUTH  DAILY 90 tablet 3  . amphetamine-dextroamphetamine (ADDERALL XR) 30 MG 24 hr capsule Take 1 capsule (30 mg total) by mouth every morning. 90 capsule 0  . ARIPiprazole (ABILIFY) 5 MG tablet Take 5 mg by mouth daily.     Marland Kitchen aspirin EC 81 MG tablet Take 81 mg by mouth every other day.     . Calcium-Magnesium-Vitamin D [id number] MG-MG-UNIT TB24 Take 1 tablet by mouth 2 (two) times daily.    Marland Kitchen co-enzyme Q-10 50 MG capsule Take 50 mg by mouth daily.    . cyanocobalamin (,VITAMIN B-12,) 1000 MCG/ML injection Inject 1 mL (1,000 mcg total) into the muscle every 21 ( twenty-one) days. 4 mL 3  . diclofenac sodium (VOLTAREN) 1 % GEL Apply 1 g topically 2 (two) times daily as needed (pain).     . ferrous sulfate 325 (65 FE) MG tablet Take 1 tablet (325 mg total) by mouth every Monday, Wednesday, and Friday.    . fluticasone  (FLONASE) 50 MCG/ACT nasal spray 2 SPRAYS INTO BOTH NOSTRILS DAILY 16 g 6  . hydrocortisone (ANUSOL-HC) 25 MG suppository Place 25 mg rectally 2 (two) times daily as needed for hemorrhoids or itching.    . Indomethacin POWD by Does not apply route 3 (three) times daily.    . INDOMETHACIN RE by Other route.    . lamoTRIgine (LAMICTAL) 200 MG tablet Take 200 mg by mouth daily.     Marland Kitchen lamoTRIgine (LAMICTAL) 200 MG tablet Take 200 mg by mouth daily.    Marland Kitchen linaclotide (LINZESS) 290 MCG CAPS capsule Take 1 capsule (290 mcg total) by mouth daily. 30 capsule 6  . lithium carbonate (ESKALITH) 450 MG CR tablet 450 mg.     . magic mouthwash w/lidocaine SOLN Take 5  mLs by mouth 3 (three) times daily as needed for mouth pain. 160 mL 0  . MAGNESIUM PO Take 1,000 mg by mouth 3 (three) times daily.    . Manganese 50 MG TABS Take 50 mg by mouth daily.     . Morphine-Naltrexone (EMBEDA) 80-3.2 MG CPCR Take by mouth.    . Morphine-Naltrexone 80-3.2 MG CPCR Take 1 tablet by mouth daily. 90 capsule 0  . Multiple Vitamins-Minerals (ICAPS AREDS 2) CAPS Take 1 capsule by mouth daily.    . NONFORMULARY OR COMPOUNDED ITEM Testosterone propionate 2% in white petrolatum, apply once daily for 5 days per week and twice a day for 2 days per week.  Dispense:  60 grams. 60 each 0  . ondansetron (ZOFRAN) 4 MG tablet Take 1 tablet (4 mg total) by mouth every 8 (eight) hours as needed for nausea or vomiting. 30 tablet 1  . pentosan polysulfate (ELMIRON) 100 MG capsule Take 200 mg by mouth 2 (two) times daily.     . pentosan polysulfate (ELMIRON) 100 MG capsule Take 100 mg by mouth 3 (three) times daily.    . Polyethylene Glycol 3350 (MIRALAX PO) Take 17 g by mouth 4 (four) times daily.     . pregabalin (LYRICA) 150 MG capsule Take 1 capsule (150 mg total) by mouth 3 (three) times daily. 90 capsule 6  . traMADol (ULTRAM) 50 MG tablet Take 1 tablet (50 mg total) by mouth 2 (two) times daily as needed. 40 tablet 0  . ursodiol  (ACTIGALL) 300 MG capsule TAKE ONE TABLET BY MOUTH TWICE DAILY 60 capsule 5  . valACYclovir (VALTREX) 1000 MG tablet Take 2 tablets (2,000 mg total) by mouth 2 (two) times daily. For 1 day 4 tablet 0   No current facility-administered medications for this visit.     Allergies as of 09/13/2016 - Review Complete 09/13/2016  Allergen Reaction Noted  . Codeine phosphate Nausea And Vomiting 12/06/2006  . Cymbalta [duloxetine hcl]  11/15/2011  . Celebrex [celecoxib] Other (See Comments) 03/09/2015  . Inh [isoniazid]  05/11/2012  . Nucynta [tapentadol] Other (See Comments) 06/03/2015  . Silenor [doxepin hcl] Other (See Comments) 11/03/2014  . Meperidine hcl Rash 11/09/2007  . Sulfamethoxazole Rash 12/06/2006    Family History  Problem Relation Age of Onset  . CAD Father 40    MI, nonsmoker  . Esophageal cancer Father 47  . Stomach cancer Father   . Scleroderma Mother   . Hypertension Mother   . Esophageal cancer Paternal Grandfather   . Stomach cancer Paternal Grandfather   . Diabetes Maternal Grandmother   . Stroke Neg Hx     Social History   Social History  . Marital status: Married    Spouse name: N/A  . Number of children: N/A  . Years of education: N/A   Occupational History  . retired Unemployed   Social History Main Topics  . Smoking status: Never Smoker  . Smokeless tobacco: Never Used  . Alcohol use No  . Drug use: No  . Sexual activity: Yes    Partners: Male    Birth control/ protection: Post-menopausal     Comment: vasectomy   Other Topics Concern  . 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   Activity: tries to walk dog (45 min 3x/wk), yoga   Diet: good water, fruits/vegetables daily      Rheum:  [name]   Psychiatrist: [facility]   Surgery: Redmond Pulling   GIFuller Plan   Urology: Amalia Hailey    Review of Systems:    Constitutional: No weight loss, fever, chills,  weakness or fatigue Skin: No rash  Cardiovascular: No chest pain Respiratory: No SOB Gastrointestinal: See HPI and otherwise negative   Physical Exam:  Vital signs: BP 116/74   Pulse 76   Ht 5\' 5"  (1.651 m)   Wt 131 lb (59.4 kg)   LMP 07/25/1998   BMI 21.80 kg/m   Constitutional: Pleasant Caucasian female appears to be in NAD, Well developed, Well nourished, alert and cooperative Head:  Normocephalic and atraumatic. Eyes:   PEERL, EOMI. No icterus. Conjunctiva pink. Ears:  Normal auditory acuity. Neck:  Supple Throat: Oral cavity and pharynx without inflammation, swelling or lesion.  Respiratory: Respirations even and unlabored. Lungs clear to auscultation bilaterally.   No wheezes, crackles, or rhonchi.  Cardiovascular: Normal S1, S2. No MRG. Regular rate and rhythm. No peripheral edema, cyanosis or pallor.  Gastrointestinal: Soft, nondistended, moderate epigastric abdominal pain. No rebound or guarding. Normal bowel sounds. No appreciable masses or hepatomegaly. Rectal:  Not performed.  Msk:  Symmetrical without gross deformities. Without edema, no deformity or joint abnormality.  Neurologic:  Alert and  oriented x4;  grossly normal neurologically.  Skin:   Dry and intact without significant lesions or rashes. Psychiatric: Demonstrates good judgement and reason without abnormal affect or behaviors.  No recent labs or imaging.  Assessment: 1. Epigastric Abdominal Pain:For the past 3 weeks, sometimes feels like a "hot searing knife", associated with nausea and reflux; consider relation to known recurrent bile duct stones versus gastritis versus GERD versus other 2. Dysphagia: Ever since recent spinal surgery, has lasted almost 6 months now, neurosurgeon recommended EGD for further eval of possible stricture versus ring versus mass versus other 3. Chronic Constipation: Controlled currently on daily Linzess and MiraLAX as well as a high-fiber diet 4. GERD: Some breakthrough  symptoms on Nexium 40 mg daily, this was decreased a year ago by her PCP due to concern for "side effects" 5. 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 Colorectal cancer screening: Patient has never had a colonoscopy, only a flexible sigmoidoscopy  Plan: 1. Ordered RUQ Ultrasound to consider choledocholithiasis. 2. Ordered labs to include a CBC and CMP 3. Scheduled patient for EGD with dilation due to recent dysphagia symptoms as well as a colonoscopy for screening purposes. Discussed risks, benefits, limitations and alternatives the patient agrees to proceed. This was scheduled with Dr. Fuller Plan in the [facility] 4. Continue Nexium 40 mg daily for now, could consider increasing this to twice daily in the future 5. Patient to follow in clinic per recommendations from Dr. Fuller Plan. after time of procedures.  Ellouise Newer, PA-C [facility] 09/13/2016, 9:20 AM  Cc: Ria Bush, MD

## 2016-09-13 NOTE — Progress Notes (Signed)
Reviewed and agree with initial management plan. If abd [place] or lab results suggest CBD stone(s) will need ERCP or MRCP. Colonoscopy and EGD after biliary evaluation has been completed.   Pricilla Riffle. Fuller Plan, MD [facility]

## 2016-09-13 NOTE — Telephone Encounter (Signed)
please get CMP, CBC, lipase today or prior to office appt tomorrow

## 2016-09-16 ENCOUNTER — Ambulatory Visit (HOSPITAL_COMMUNITY)
Admission: RE | Admit: 2016-09-16 | Discharge: 2016-09-16 | Disposition: A | Discharge status: Home / Self Care | Payer: Medicare Other | Source: Ambulatory Visit | Attending: Physician Assistant | Admitting: Physician Assistant

## 2016-09-16 ENCOUNTER — Other Ambulatory Visit: Payer: Self-pay | Admitting: Radiology

## 2016-09-16 DIAGNOSIS — K838 Other specified diseases of biliary tract: Secondary | ICD-10-CM | POA: Insufficient documentation

## 2016-09-16 DIAGNOSIS — R1310 Dysphagia, unspecified: Secondary | ICD-10-CM

## 2016-09-16 DIAGNOSIS — K831 Obstruction of bile duct: Secondary | ICD-10-CM | POA: Diagnosis present

## 2016-09-16 DIAGNOSIS — K5900 Constipation, unspecified: Secondary | ICD-10-CM | POA: Diagnosis not present

## 2016-09-16 DIAGNOSIS — K219 Gastro-esophageal reflux disease without esophagitis: Secondary | ICD-10-CM | POA: Diagnosis not present

## 2016-09-16 DIAGNOSIS — R1013 Epigastric pain: Secondary | ICD-10-CM

## 2016-09-16 DIAGNOSIS — Z9049 Acquired absence of other specified parts of digestive tract: Secondary | ICD-10-CM | POA: Insufficient documentation

## 2016-09-16 DIAGNOSIS — R110 Nausea: Secondary | ICD-10-CM

## 2016-09-16 NOTE — Telephone Encounter (Signed)
We cannot refill this. We have not Rx'd this before to best of my knowledge and it's use. We can discuss at f-u.

## 2016-09-16 NOTE — Telephone Encounter (Signed)
Refill request received via fax for Indomethacin gel from [facility], do you want to refill this ?  This is not an option in our computer, I think it is compounded , so I pended a compound Rx   If you want to refill please send it in.

## 2016-09-19 ENCOUNTER — Telehealth: Payer: Self-pay | Admitting: Rheumatology

## 2016-09-19 NOTE — Telephone Encounter (Signed)
Patient called inquiring about herIndomethacin gel that she requested a refill on.  [phone].  Thank you.

## 2016-09-19 NOTE — Telephone Encounter (Signed)
Per Mr. Carlyon Shadow he had denied the prescription on 09/16/16 stating we had not prescribed this prescription to the best of his knowledge. 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 Patient is requesting to refill the prescription for the Indomethacin gel.

## 2016-09-20 ENCOUNTER — Telehealth: Payer: Self-pay | Admitting: Rheumatology

## 2016-09-20 MED ORDER — NONFORMULARY OR COMPOUNDED ITEM: 2 refills | Status: DC

## 2016-09-20 NOTE — Telephone Encounter (Signed)
Okay to refill the prescription

## 2016-09-20 NOTE — Telephone Encounter (Signed)
Patient advised prescription sent to pharmacy. 

## 2016-09-20 NOTE — Telephone Encounter (Signed)
Patient called and [facility] does not have the rx Indomethacin that was sent over today. Patient spoke with Gwinda Passe at the pharmacy and was told to have the office call the pharmacy to call it in.   Pharmacy # [phone]

## 2016-09-20 NOTE — Telephone Encounter (Signed)
Patient advised prescription has been faxed to the pharmacy.  

## 2016-09-22 ENCOUNTER — Telehealth: Payer: Self-pay | Admitting: Physician Assistant

## 2016-09-22 HISTORY — PX: ESOPHAGOGASTRODUODENOSCOPY: SHX1529

## 2016-09-22 NOTE — Telephone Encounter (Signed)
Levin Erp, [place] sent to Barron Alvine, RN        Ok to go ahead with EGD/[place]- tell her [place] ok. Thanks-JLL    Pt has been advised and will keep appt Monday with Dr Fuller Plan

## 2016-09-26 ENCOUNTER — Encounter: Payer: Self-pay | Admitting: Gastroenterology

## 2016-09-26 ENCOUNTER — Ambulatory Visit (INDEPENDENT_AMBULATORY_CARE_PROVIDER_SITE_OTHER): Payer: Medicare Other | Admitting: Gastroenterology

## 2016-09-26 VITALS — BP 110/66 | HR 87 | Ht 65.0 in | Wt 134.0 lb

## 2016-09-26 DIAGNOSIS — K831 Obstruction of bile duct: Secondary | ICD-10-CM | POA: Diagnosis not present

## 2016-09-26 DIAGNOSIS — Z1211 Encounter for screening for malignant neoplasm of colon: Secondary | ICD-10-CM

## 2016-09-26 DIAGNOSIS — R1313 Dysphagia, pharyngeal phase: Secondary | ICD-10-CM | POA: Diagnosis not present

## 2016-09-26 DIAGNOSIS — K219 Gastro-esophageal reflux disease without esophagitis: Secondary | ICD-10-CM

## 2016-09-26 MED ORDER — ESOMEPRAZOLE MAGNESIUM 40 MG PO CPDR: 40.0000 mg | DELAYED_RELEASE_CAPSULE | Freq: Two times a day (BID) | ORAL | 11 refills | Status: DC

## 2016-09-26 MED ORDER — NA SULFATE-K SULFATE-MG SULF 17.5-3.13-1.6 GM/177ML PO SOLN: 1.0000 | Freq: Once | ORAL | 0 refills | Status: AC

## 2016-09-26 NOTE — Progress Notes (Signed)
History of Present Illness: This is a 61 year old female with swallowing problems since cervical diskectomy with hardware placed in 02/2016. He has had ongoing difficulty swallowing large pills and meat since 02/2016. Symptoms have not changed for several months. She notes worsening heartburn and reflux symptoms as well as occasional epigastric pain. Nexium was decreased from twice daily to once daily several months ago. Recent LFTs and abdominal ultrasound unremarkable.  Allergies  Allergen Reactions  . Codeine Phosphate Nausea And Vomiting    Severe n&v  . Cymbalta [Duloxetine Hcl]     Urinary retention  . Celebrex [Celecoxib] Other (See Comments)    Headaches  . Inh [Isoniazid]     Hepatitis  . Nucynta [Tapentadol] Other (See Comments)    Agitation, jerking in legs  . Silenor [Doxepin Hcl] Other (See Comments)    nightmare, dizziness, stumbling upon walking, not effective  . Meperidine Hcl Rash    REACTION: red skin  . Sulfamethoxazole Rash   Outpatient Medications Prior to Visit  Medication Sig Dispense Refill  . acyclovir (ZOVIRAX) 800 MG tablet Take one-half tablet by  mouth every day 45 tablet 3  . ALPRAZolam (XANAX) 1 MG tablet Take 1 tablet (1 mg total) by mouth 3 (three) times daily as needed for anxiety. With 1/2 tab at night time 60 tablet 0  . amLODipine (NORVASC) 5 MG tablet TAKE 1 TABLET BY MOUTH  DAILY 90 tablet 3  . amphetamine-dextroamphetamine (ADDERALL XR) 30 MG 24 hr capsule Take 1 capsule (30 mg total) by mouth every morning. 90 capsule 0  . ARIPiprazole (ABILIFY) 5 MG tablet Take 5 mg by mouth daily.     Marland Kitchen aspirin EC 81 MG tablet Take 81 mg by mouth every other day.     . Calcium-Magnesium-Vitamin D [id number] MG-MG-UNIT TB24 Take 1 tablet by mouth 2 (two) times daily.    Marland Kitchen co-enzyme Q-10 50 MG capsule Take 50 mg by mouth daily.    . cyanocobalamin (,VITAMIN B-12,) 1000 MCG/ML injection Inject 1 mL (1,000 mcg total) into the muscle every 21 ( twenty-one) days.  4 mL 3  . diclofenac sodium (VOLTAREN) 1 % GEL Apply 1 g topically 2 (two) times daily as needed (pain).     . ferrous sulfate 325 (65 FE) MG tablet Take 1 tablet (325 mg total) by mouth every Monday, Wednesday, and Friday.    . fluticasone (FLONASE) 50 MCG/ACT nasal spray 2 SPRAYS INTO BOTH NOSTRILS DAILY 16 g 6  . hydrocortisone (ANUSOL-HC) 25 MG suppository Place 25 mg rectally 2 (two) times daily as needed for hemorrhoids or itching.    . Indomethacin POWD by Does not apply route 3 (three) times daily.    . INDOMETHACIN RE by Other route.    . lamoTRIgine (LAMICTAL) 200 MG tablet Take 200 mg by mouth daily.    Marland Kitchen linaclotide (LINZESS) 290 MCG CAPS capsule Take 1 capsule (290 mcg total) by mouth daily. 30 capsule 6  . lithium carbonate (ESKALITH) 450 MG CR tablet 450 mg.     . magic mouthwash w/lidocaine SOLN Take 5 mLs by mouth 3 (three) times daily as needed for mouth pain. 160 mL 0  . MAGNESIUM PO Take 1,000 mg by mouth 3 (three) times daily.    . Manganese 50 MG TABS Take 50 mg by mouth daily.     . Morphine-Naltrexone 80-3.2 MG CPCR Take 1 tablet by mouth daily. 90 capsule 0  . Multiple Vitamins-Minerals (ICAPS AREDS 2) CAPS Take  1 capsule by mouth daily.    . NONFORMULARY OR COMPOUNDED ITEM Testosterone propionate 2% in white petrolatum, apply once daily for 5 days per week and twice a day for 2 days per week.  Dispense:  60 grams. 60 each 0  . NONFORMULARY OR COMPOUNDED ITEM Indomethacin PLO10% AAA up to BID # 60 mL 60 each 2  . ondansetron (ZOFRAN) 4 MG tablet Take 1 tablet (4 mg total) by mouth every 8 (eight) hours as needed for nausea or vomiting. 30 tablet 1  . pentosan polysulfate (ELMIRON) 100 MG capsule Take 100 mg by mouth 3 (three) times daily.    . Polyethylene Glycol 3350 (MIRALAX PO) Take 17 g by mouth 4 (four) times daily.     . pregabalin (LYRICA) 150 MG capsule Take 1 capsule (150 mg total) by mouth 3 (three) times daily. 90 capsule 6  . traMADol (ULTRAM) 50 MG tablet  Take 1 tablet (50 mg total) by mouth 2 (two) times daily as needed. 40 tablet 0  . ursodiol (ACTIGALL) 300 MG capsule TAKE ONE TABLET BY MOUTH TWICE DAILY 60 capsule 5  . valACYclovir (VALTREX) 1000 MG tablet Take 2 tablets (2,000 mg total) by mouth 2 (two) times daily. For 1 day 4 tablet 0  . lamoTRIgine (LAMICTAL) 200 MG tablet Take 200 mg by mouth daily.     . Morphine-Naltrexone (EMBEDA) 80-3.2 MG CPCR Take by mouth.    . pentosan polysulfate (ELMIRON) 100 MG capsule Take 200 mg by mouth 2 (two) times daily.      No facility-administered medications prior to visit.    Past Medical History:  Diagnosis Date  . Abdominal pain last 4 months   and nausea also  . Allergy   . Anemia   . 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 C6- C7 -hx. past fusion(bone graft used)  . Cholecystitis   . Chronic pain   . DDD (degenerative disc disease), lumbar 09/2015   dextroscoliosis with multilevel DDD and facet arthrosis most notable for R foraminal disc protrusion L4/5 producing severe R neural foraminal stenosis abutting R L4 nerve root, moderate spinal canal and mild lat recesss and R neural foraminal stenosis L3/4 (MRI)  . Depression    bipolar depression  . Disorders of porphyrin metabolism   . Fibromyalgia   . GERD (gastroesophageal reflux disease)   . Internal hemorrhoids   . Interstitial cystitis 06-06-12   hx.  . Irritable bowel syndrome   . PONV (postoperative nausea and vomiting)    now uses stomach blockers and no ponv  . Raynauds disease    hx.  . Seronegative arthritis    Rachel Duran  . 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 ANTERIOR CERVICAL DECOMP/DISCECTOMY FUSION  2004   C5/6, C6/7  . ANTERIOR CERVICAL DECOMP/DISCECTOMY FUSION  02/2016   C3/4, C4/5 with plating Rachel Duran)  . BREAST ENHANCEMENT SURGERY  2010  . BREAST IMPLANT EXCHANGE  10/2014   exchange  saline implants, B mastopexy/capsulorraphy (Rachel Duran [facility])  . BUNIONECTOMY Bilateral yrs ago  . CESAREAN SECTION     x 1  . CHOLECYSTECTOMY  06/11/2012   Procedure: LAPAROSCOPIC CHOLECYSTECTOMY WITH INTRAOPERATIVE CHOLANGIOGRAM;  Surgeon: [name], MD,FACS;  Location: [facility];  Service: General;  Laterality: N/A;  . CYSTOSCOPY    . ERCP  05/22/2012   Procedure: ENDOSCOPIC RETROGRADE CHOLANGIOPANCREATOGRAPHY (ERCP);  Surgeon: [name], MD,FACG;  Location: [facility];  Service: Endoscopy;  Laterality: N/A;  . ERCP N/A 09/17/2013   Procedure: ENDOSCOPIC RETROGRADE CHOLANGIOPANCREATOGRAPHY (ERCP);  Surgeon: [name], MD;  Location: [facility];  Service: Endoscopy;  Laterality: N/A;  . HEMORRHOID BANDING  09-23-13   --Dr. Greer Pickerel  . HERNIA REPAIR     inguinal  . HYSTEROSCOPY W/ ENDOMETRIAL ABLATION    . NASAL SINUS SURGERY     x5   Social History   Social History  . Marital status: Married    Spouse name: N/A  . Number of children: N/A  . Years of education: N/A   Occupational History  . retired Unemployed   Social History Main Topics  . Smoking status: Never Smoker  . Smokeless tobacco: Never Used  . Alcohol use No  . Drug use: No  . Sexual activity: Yes    Partners: Male    Birth control/ protection: Post-menopausal     Comment: vasectomy   Other Topics Concern  . 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 CAD Father 7    MI, nonsmoker  . Esophageal cancer Father 75  . Stomach cancer Father   . Scleroderma Mother   . Hypertension Mother   . Esophageal cancer Paternal Grandfather   . Stomach cancer Paternal  Grandfather   . Diabetes Maternal Grandmother   . Stroke Neg Hx       Physical Exam: General: Well developed, well nourished, no acute distress Head: Normocephalic and atraumatic Eyes:  sclerae anicteric, EOMI Ears: Normal auditory acuity Mouth: No deformity or lesions Lungs: Clear throughout to auscultation Heart: Regular rate and rhythm; no murmurs, rubs or bruits Abdomen: Soft, non tender and non distended. No masses, hepatosplenomegaly or hernias noted. Normal Bowel sounds Musculoskeletal: Symmetrical with no gross deformities  Pulses:  Normal pulses noted Extremities: No clubbing, cyanosis, edema or deformities noted Neurological: Alert oriented x 4, grossly nonfocal Psychological:  Alert and cooperative. Normal mood and affect  Assessment and Recommendations:  1. Dysphagia, likely esophagopharyngeal related to cervical spine hardware. Rule out intrinsic esophageal disorders. Schedule barium esophagram and EGD. Endoscopic dilation will not improve swallowing function if the cervical hardware is the source of the problem. The risks (including bleeding, perforation, infection, missed lesions, medication reactions and possible hospitalization or surgery if complications occur), benefits, and alternatives to endoscopy with possible biopsy and possible dilation were discussed with the patient and they consent to proceed.   2. GERD. Symptoms not well controlled. Increase Nexium to 40 mg twice daily. Continue to follow antireflux measures. EGD as above.  3. Biliary stasis. Status post ERCP with sphincterotomy in 2015. Recent LFTs normal. Continue

## 2016-09-26 NOTE — Patient Instructions (Addendum)
We have sent the following medications to your pharmacy for you to pick up at your convenience: Nexium 40 mg twice daily.   You have been scheduled for a Barium Esophogram at [facility] (medical mall) on 09/29/16 at 11:00am. Please arrive 15 minutes prior to your appointment for registration. Make certain not to have anything to eat or drink 63ours prior to your test. If you need to reschedule for any reason, please contact radiology at [phone] to do so. __________________________________________________________________ A barium swallow is an examination that concentrates on views of the esophagus. This tends to be a double contrast exam (barium and two liquids which, when combined, create a gas to distend the wall of the oesophagus) or single contrast (non-ionic iodine based). The study is usually tailored to your symptoms so a good history is essential. Attention is paid during the study to the form, structure and configuration of the esophagus, looking for functional disorders (such as aspiration, dysphagia, achalasia, motility and reflux) EXAMINATION You may be asked to change into a gown, depending on the type of swallow being performed. A radiologist and radiographer will perform the procedure. The radiologist will advise you of the type of contrast selected for your procedure and direct you during the exam. You will be asked to stand, sit or lie in several different positions and to hold a small amount of fluid in your mouth before being asked to swallow while the imaging is performed .In some instances you may be asked to swallow barium coated marshmallows to assess the motility of a solid food bolus. The exam can be recorded as a digital or video fluoroscopy procedure. POST PROCEDURE It will take 1-2 days for the barium to pass through your system. To facilitate this, it is important, unless otherwise directed, to increase your fluids for the next 24-48hrs and to resume your  normal diet.  This test typically takes about 30 minutes to perform. _________________________________________________________________________  Rachel Duran have been scheduled for an endoscopy and colonoscopy. Please follow the written instructions given to you at your visit today. Please pick up your prep supplies at the pharmacy within the next 1-3 days. If you use inhalers (even only as needed), please bring them with you on the day of your procedure. Your physician has requested that you go to www.startemmi.com and enter the access code given to you at your visit today. This web site gives a general overview about your procedure. However, you should still follow specific instructions given to you by our office regarding your preparation for the procedure.  Thank you for choosing me and [facility].  Pricilla Riffle. Dagoberto Ligas., MD., Marval Regal

## 2016-09-29 ENCOUNTER — Ambulatory Visit
Admission: RE | Admit: 2016-09-29 | Discharge: 2016-09-29 | Disposition: A | Discharge status: Home / Self Care | Payer: Medicare Other | Source: Ambulatory Visit | Attending: Gastroenterology | Admitting: Gastroenterology

## 2016-09-29 DIAGNOSIS — R1313 Dysphagia, pharyngeal phase: Secondary | ICD-10-CM | POA: Insufficient documentation

## 2016-09-29 DIAGNOSIS — K222 Esophageal obstruction: Secondary | ICD-10-CM | POA: Insufficient documentation

## 2016-09-29 DIAGNOSIS — K219 Gastro-esophageal reflux disease without esophagitis: Secondary | ICD-10-CM | POA: Diagnosis not present

## 2016-10-03 ENCOUNTER — Encounter: Payer: Self-pay | Admitting: Gastroenterology

## 2016-10-03 ENCOUNTER — Ambulatory Visit (AMBULATORY_SURGERY_CENTER): Payer: Medicare Other | Admitting: Gastroenterology

## 2016-10-03 VITALS — BP 105/65 | HR 78 | Temp 98.4°F | Resp 21 | Ht 65.0 in | Wt 134.0 lb

## 2016-10-03 DIAGNOSIS — K219 Gastro-esophageal reflux disease without esophagitis: Secondary | ICD-10-CM

## 2016-10-03 DIAGNOSIS — R1314 Dysphagia, pharyngoesophageal phase: Secondary | ICD-10-CM

## 2016-10-03 MED ORDER — SODIUM CHLORIDE 0.9 % IV SOLN: 500.0000 mL | INTRAVENOUS | Status: DC

## 2016-10-03 NOTE — Progress Notes (Signed)
Called to room to assist during endoscopic procedure.  Patient ID and intended procedure confirmed with present staff. Received instructions for my participation in the procedure from the performing physician.  

## 2016-10-03 NOTE — Patient Instructions (Signed)
Handout given for Post esophageal dilation diet   YOU HAD AN ENDOSCOPIC PROCEDURE TODAY: Refer to the procedure report and other information in the discharge instructions given to you for any specific questions about what was found during the examination. If this information does not answer your questions, please call [place] office at [phone] to clarify.   YOU SHOULD EXPECT: Some feelings of bloating in the abdomen. Passage of more gas than usual. Walking can help get rid of the air that was put into your GI tract during the procedure and reduce the bloating. If you had a lower endoscopy (such as a colonoscopy or flexible sigmoidoscopy) you may notice spotting of blood in your stool or on the toilet paper. Some abdominal soreness may be present for a day or two, also.  DIET: Your first meal following the procedure should be a light meal and then it is ok to progress to your normal diet. A half-sandwich or bowl of soup is an example of a good first meal. Heavy or fried foods are harder to digest and may make you feel nauseous or bloated. Drink plenty of fluids but you should avoid alcoholic beverages for 24 hours. If you had a esophageal dilation, please see attached instructions for diet.    ACTIVITY: Your care partner should take you home directly after the procedure. You should plan to take it easy, moving slowly for the rest of the day. You can resume normal activity the day after the procedure however YOU SHOULD NOT DRIVE, use power tools, machinery or perform tasks that involve climbing or major physical exertion for 24 hours (because of the sedation medicines used during the test).   SYMPTOMS TO REPORT IMMEDIATELY: A gastroenterologist can be reached at any hour. Please call [phone]  for any of the following symptoms:   Following upper endoscopy (EGD, EUS, ERCP, esophageal dilation) Vomiting of blood or coffee ground material  New, significant abdominal pain  New, significant chest  pain or pain under the shoulder blades  Painful or persistently difficult swallowing  New shortness of breath  Black, tarry-looking or red, bloody stools  FOLLOW UP:  If any biopsies were taken you will be contacted by phone or by letter within the next 1-3 weeks. Call [phone]  if you have not heard about the biopsies in 3 weeks.  Please also call with any specific questions about appointments or follow up tests.

## 2016-10-03 NOTE — Op Note (Addendum)
[facility] Patient Name: Shemeka Wardle Procedure Date: 10/03/2016 11:47 AM MRN: [id number] Endoscopist: [name] , MD Age: 61 Referring MD:  Date of Birth: Feb 05, 1956 Gender: Female Account #: [id number] Procedure:                Upper GI endoscopy Indications:              Dysphagia, GERD Medicines:                Monitored Anesthesia Care Procedure:                Pre-Anesthesia Assessment:                           - Prior to the procedure, a History and Physical                            was performed, and patient medications and                            allergies were reviewed. The patient's tolerance of                            previous anesthesia was also reviewed. The risks                            and benefits of the procedure and the sedation                            options and risks were discussed with the patient.                            All questions were answered, and informed consent                            was obtained. Prior Anticoagulants: The patient has                            taken no previous anticoagulant or antiplatelet                            agents. ASA Grade Assessment: II - A patient with                            mild systemic disease. After reviewing the risks                            and benefits, the patient was deemed in                            satisfactory condition to undergo the procedure.                           After obtaining informed consent, the endoscope was  passed under direct vision. Throughout the                            procedure, the patient's blood pressure, pulse, and                            oxygen saturations were monitored continuously. The                            Endoscope was introduced through the mouth, and                            advanced to the second part of duodenum. The upper                            GI endoscopy was accomplished without  difficulty.                            The patient tolerated the procedure well. Scope In: Scope Out: Findings:                 No endoscopic abnormality was evident in the                            esophagus to explain the patient's complaint of                            dysphagia. It was decided, however, to proceed with                            dilation of the entire esophagus. A guidewire was                            placed and the scope was withdrawn. Dilation was                            performed with a Savary dilator with no resistance                            at 16 mm.                           The entire examined stomach was normal.                           The duodenal bulb and second portion of the                            duodenum were normal. Complications:            No immediate complications. Estimated Blood Loss:     Estimated blood loss: none. Impression:               - No endoscopic esophageal abnormality to explain  patient's dysphagia. Esophagus dilated. Dilated.                           - Normal stomach.                           - Normal duodenal bulb and second portion of the                            duodenum.                           - No specimens collected. Recommendation:           - Patient has a contact number available for                            emergencies. The signs and symptoms of potential                            delayed complications were discussed with the                            patient. Return to normal activities tomorrow.                            Written discharge instructions were provided to the                            patient.                           - Clear liquid diet for 2 hours, then advance as                            tolerated to soft diet today. Resume prior diet                            tomorrow.                           - Continue present medications. [name], MD 10/03/2016 12:04:12 PM This report has been signed electronically.

## 2016-10-04 ENCOUNTER — Telehealth: Payer: Self-pay

## 2016-10-04 ENCOUNTER — Telehealth: Payer: Self-pay | Admitting: *Deleted

## 2016-10-04 NOTE — Telephone Encounter (Signed)
  Follow up Call-  Call back number 10/03/2016  Post procedure Call Back phone  # [phone]  Permission to leave phone message Yes  Some recent data might be hidden     Patient questions:  Do you have a fever, pain , or abdominal swelling? No. Pain Score  0 *  Have you tolerated food without any problems? Yes.    Have you been able to return to your normal activities? Yes.    Do you have any questions about your discharge instructions: Diet   No. Medications  No. Follow up visit  No.  Do you have questions or concerns about your Care? No.  Actions: * If pain score is 4 or above: No action needed, pain <4.

## 2016-10-04 NOTE — Telephone Encounter (Signed)
No answer. Message left to call if questions or concerns and that we would make one additional attempt to call later today.

## 2016-10-04 NOTE — Progress Notes (Signed)
Office Visit Note  Patient: Rachel Duran             Date of Birth: 04-Mar-1956           MRN: [id number]             PCP: Rachel Bush, MD Referring: Rachel Bush, MD Visit Date: 10/07/2016 Occupation: @GUAROCC @    Subjective:  Joint Pain and Fatigue   History of Present Illness: Rachel Duran is a 61 y.o. female with history of fibromyalgia disc disease and osteoarthritis. According to patient she's been having a lot of discomfort lately. She had C-spine discectomy and fusion in August 2017. She states since then she's been having difficulty swallowing. She had esophageal dilation which helped to some extent. She also moved to an apartment for downsizing and that was quite a stressful for her. She's been experiencing increased pain in her bilateral hands. She also has increased pain in her bilateral knee joints ankles and feet. She requests getting cortisone injection to her bilateral knee joints. She's been experiencing increased fatigue as well. She states she's been active walking 4-5 times per week. She rates her pain on scale of 0-10 about 7 and fatigue about 9.  Activities of Daily Living:  Patient reports morning stiffness for hours.   Patient Reports nocturnal pain.  Difficulty dressing/grooming: Denies Difficulty climbing stairs: Reports Difficulty getting out of chair: Reports Difficulty using hands for taps, buttons, cutlery, and/or writing: Reports   Review of Systems  Constitutional: Positive for fatigue. Negative for night sweats, weight gain, weight loss and weakness.  HENT: Positive for mouth dryness. Negative for mouth sores, trouble swallowing, trouble swallowing and nose dryness.   Eyes: Negative for pain, redness, visual disturbance and dryness.  Respiratory: Negative for cough, shortness of breath and difficulty breathing.   Cardiovascular: Negative for chest pain, palpitations, hypertension, irregular heartbeat and swelling in legs/feet.    Gastrointestinal: Positive for constipation. Negative for blood in stool and diarrhea.  Endocrine: Negative for increased urination.  Genitourinary: Negative for vaginal dryness.  Musculoskeletal: Positive for arthralgias, joint pain, myalgias, morning stiffness and myalgias. Negative for joint swelling, muscle weakness and muscle tenderness.  Skin: Negative for color change, rash, hair loss, skin tightness, ulcers and sensitivity to sunlight.  Allergic/Immunologic: Negative for susceptible to infections.  Neurological: Negative for dizziness, memory loss and night sweats.  Hematological: Negative for swollen glands.  Psychiatric/Behavioral: Positive for depressed mood and sleep disturbance. The patient is nervous/anxious.     PMFS History:  Patient Active Problem List   Diagnosis Date Noted  . Biliary stasis 09/26/2016  . Fever blister 08/18/2016  . Urinary urgency 07/05/2016  . Dry mouth 06/06/2016  . HNP (herniated nucleus pulposus), lumbar 09/23/2015  . Sinus congestion 07/30/2015  . Iron deficiency 07/30/2015  . Health maintenance examination 06/15/2015  . 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 Sinus headache 03/09/2015  . Clavicle enlargement 12/13/2014  . Prediabetes 12/13/2014  . Plantar fasciitis, bilateral 09/11/2014  . Advanced care planning/counseling discussion 06/10/2014  . Medicare annual wellness visit, subsequent 06/10/2014  . Abnormal thyroid function test 06/10/2014  . Chronic pain syndrome 06/10/2014  . CFS (chronic fatigue syndrome) 04/08/2014  . Calculus of bile duct 09/17/2013  . Postmenopausal atrophic vaginitis 10/19/2012  . 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 Cervical disc disorder with radiculopathy of cervical region 05/28/2010  . Synovitis and tenosynovitis 04/30/2009  . Anemia of chronic disease 03/17/2009  . CHEST PAIN UNSPECIFIED 02/28/2008  . APHTHOUS ULCERS 01/31/2008  .  INSOMNIA, CHRONIC 11/09/2007  . Drug-induced constipation  10/11/2007  . ALLERGIC RHINITIS 04/12/2007  . MDD (major depressive disorder), recurrent episode, moderate (Rachel Duran) 03/05/2007  . [name] syndrome 03/05/2007  . GERD 03/05/2007  . ROSACEA 03/05/2007  . Seronegative arthritis 03/05/2007  . NEURALGIA 03/05/2007  . 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 CYSTITIS, CHRONIC INTERSTITIAL 12/06/2006  . Fibromyalgia 12/06/2006    Past Medical History:  Diagnosis Date  . Abdominal pain last 4 months   and nausea also  . Allergy   . Anemia   . 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 C6- C7 -hx. past fusion(bone graft used)  . Cholecystitis   . Chronic pain   . DDD (degenerative disc disease), lumbar 09/2015   dextroscoliosis with multilevel DDD and facet arthrosis most notable for R foraminal disc protrusion L4/5 producing severe R neural foraminal stenosis abutting R L4 nerve root, moderate spinal canal and mild lat recesss and R neural foraminal stenosis L3/4 (MRI)  . Depression    bipolar depression  . Disorders of porphyrin metabolism   . Fibromyalgia   . GERD (gastroesophageal reflux disease)   . Internal hemorrhoids   . Interstitial cystitis 06-06-12   hx.  . Irritable bowel syndrome   . PONV (postoperative nausea and vomiting)    now uses stomach blockers and no ponv  . Raynauds disease    hx.  . Seronegative arthritis    Rachel Duran  . 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 CAD Father 39    MI, nonsmoker  . Esophageal cancer Father 49  . Stomach cancer Father   . Scleroderma Mother   . Hypertension Mother   . Esophageal cancer Paternal Grandfather   . Stomach cancer Paternal Grandfather   . Diabetes Maternal Grandmother   . Stroke Neg Hx   . Colon cancer Neg Hx    Past Surgical History:  Procedure Laterality Date  . ANTERIOR CERVICAL DECOMP/DISCECTOMY FUSION  2004   C5/6, C6/7  .  ANTERIOR CERVICAL DECOMP/DISCECTOMY FUSION  02/2016   C3/4, C4/5 with plating [name])  . BREAST ENHANCEMENT SURGERY  2010  . BREAST IMPLANT EXCHANGE  10/2014   exchange saline implants, B mastopexy/capsulorraphy ([facility])  . BUNIONECTOMY Bilateral yrs ago  . CESAREAN SECTION     x 1  . CHOLECYSTECTOMY  06/11/2012   Procedure: LAPAROSCOPIC CHOLECYSTECTOMY WITH INTRAOPERATIVE CHOLANGIOGRAM;  Surgeon: [name], MD,FACS;  Location: [facility];  Service: General;  Laterality: N/A;  . CYSTOSCOPY    . ERCP  05/22/2012   Procedure: ENDOSCOPIC RETROGRADE CHOLANGIOPANCREATOGRAPHY (ERCP);  Surgeon: [name], MD,FACG;  Location: [facility];  Service: Endoscopy;  Laterality: N/A;  . ERCP N/A 09/17/2013   Procedure: ENDOSCOPIC RETROGRADE CHOLANGIOPANCREATOGRAPHY (ERCP);  Surgeon: [name], MD;  Location: [facility];  Service: Endoscopy;  Laterality: N/A;  . ESOPHAGOGASTRODUODENOSCOPY  09/2016   WNL. esophagus dilated Fuller Plan)  . HEMORRHOID BANDING  09-23-13   --Dr. Greer Pickerel  . HERNIA REPAIR     inguinal  . HYSTEROSCOPY W/ ENDOMETRIAL ABLATION    . 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 She appears well-developed and well-nourished.  HENT:  Head: Normocephalic and atraumatic.  Eyes: Conjunctivae and EOM are normal.  Neck: Normal range of motion.  Cardiovascular: Normal rate, regular rhythm, normal heart sounds and intact distal pulses.     Pulmonary/Chest: Effort normal and breath sounds normal.  Abdominal: Soft. Bowel sounds are normal.  Lymphadenopathy:    She has no cervical adenopathy.  Neurological: She is alert and oriented to person, place, and time.  Skin: Skin is warm and dry. Capillary refill takes less than 2 seconds.  Psychiatric: She has a normal mood and affect. Her behavior is normal.  Nursing note and vitals reviewed.    Musculoskeletal Exam: C-spine limited painful range of motion. She has limitation with lateral rotation. She has thoracic scoliosis. Shoulder joints elbow joints wrist joints are good range of motion. She has PIP/DIP thickening of her bilateral hands consistent with osteoarthritis. No synovitis was noted. Hip joints were good range of motion. Knee joints are good range of motion with no warmth swelling or effusion. She has some PIP/DIP thickening of her feet. She has  prominent dorsal spur bilaterally on her feet.  CDAI Exam: No CDAI exam completed.    Investigation: Findings:  July 2013 chest x-ray negative, hepatitis panel negative, TB gold positive treated with INH. Treated with Plaquenil in the past which was discontinued due to possible macular degeneration versus Plaquenil toxicity    Imaging: US Abdomen Complete  Result Date: 09/16/2016 CLINICAL DATA:  Abdominal pain for 4 weeks. EXAM: ABDOMEN ULTRASOUND COMPLETE COMPARISON:  10/07/2014 FINDINGS: Gallbladder: Surgically absent. Common bile duct: Diameter: 1.2 cm proximally, unchanged. A small amount of echogenic material in the duct is less than what was present on the prior study. The distal common duct was obscured by bowel gas. Liver: No focal lesion identified. Within normal limits in parenchymal echogenicity. IVC: No abnormality visualized. Pancreas: Not visualized due to overlying bowel gas. Spleen: Size and appearance within normal limits. Right Kidney: Length: 10.8 cm. Echogenicity within normal limits. No mass or  hydronephrosis visualized. Left Kidney: Length: 11.4 cm. Echogenicity within normal limits. No mass or hydronephrosis visualized. Abdominal aorta: Normal caliber proximally. Mid to distal aorta and aortic bifurcation not visualized due to bowel gas. Other findings: None. IMPRESSION: 1. Prior cholecystectomy with unchanged common bile duct dilatation. Distal duct obscured. 2. Pancreas and distal aorta obscured by bowel gas. Electronically Signed   By: Logan Bores M.D.   On: 09/16/2016 09:16   Dg Esophagus  Result Date: 09/29/2016 CLINICAL DATA:  GERD, dysphagia EXAM: ESOPHOGRAM / BARIUM SWALLOW / BARIUM TABLET STUDY TECHNIQUE: Combined double contrast and single contrast examination performed using effervescent crystals, thick barium liquid, and thin barium liquid. The patient was observed with fluoroscopy swallowing a 13 mm barium sulphate tablet. FLUOROSCOPY TIME:  Fluoroscopy Time:  0.9 minutes Radiation Exposure Index (if provided by the fluoroscopic device): 3.1 mGy Number of Acquired Spot Images: 0 COMPARISON:  None. FINDINGS: There was normal pharyngeal anatomy and motility. Contrast flowed freely through the esophagus without evidence of a mass. There is a mild focal  relative narrowing of the distal third of the esophagus which does not restrict the passage of a barium tablet (image 1/series 4). There was normal esophageal mucosa without evidence of irregularity or ulceration. Mild intermittent tertiary contractions of the distal third of the esophagus. Moderate gastroesophageal reflux. No definite hiatal hernia was demonstrated. At the end of the examination a 13 mm barium tablet was administered which transited through the esophagus and esophagogastric junction without delay. IMPRESSION: 1. Moderate gastroesophageal reflux. 2. Mild intermittent tertiary contraction of the distal third of the esophagus as can be seen with mild spasm. 3. Mild focal relative narrowing of the distal third of the esophagus  which does not restrict the passage of a barium tablet. Electronically Signed   By: Kathreen Devoid   On: 09/29/2016 11:35    Speciality Comments: No specialty comments available.    Procedures:  Large Joint Inj Date/Time: 10/07/2016 11:56 AM Performed by: Bo Merino Authorized by: Bo Merino   Consent Given by:  Patient Site marked: the procedure site was marked   Timeout: prior to procedure the correct patient, procedure, and site was verified   Indications:  Pain and joint swelling Location:  Knee Site:  R knee Prep: patient was prepped and draped in usual sterile fashion   Needle Size:  27 G Needle Length:  1.5 inches Approach:  Medial Ultrasound Guidance: No   Fluoroscopic Guidance: No   Arthrogram: No   Medications:  1.5 mL lidocaine 1 %; 40 mg triamcinolone acetonide 40 MG/ML Aspiration Attempted: Yes   Aspirate amount (mL):  0 Patient tolerance:  Patient tolerated the procedure well with no immediate complications Large Joint Inj Date/Time: 10/07/2016 11:57 AM Performed by: Bo Merino Authorized by: Bo Merino   Consent Given by:  Patient Site marked: the procedure site was marked   Timeout: prior to procedure the correct patient, procedure, and site was verified   Indications:  Pain and joint swelling Location:  Knee Site:  L knee Prep: patient was prepped and draped in usual sterile fashion   Needle Size:  27 G Needle Length:  1.5 inches Approach:  Medial Ultrasound Guidance: No   Fluoroscopic Guidance: No   Arthrogram: No   Medications:  1.5 mL lidocaine 1 %; 40 mg triamcinolone acetonide 40 MG/ML Aspiration Attempted: Yes   Aspirate amount (mL):  0 Patient tolerance:  Patient tolerated the procedure well with no immediate complications   Allergies: Codeine phosphate; Cymbalta [duloxetine hcl]; Celebrex [celecoxib]; Inh [isoniazid]; Lithium; Nucynta [tapentadol]; Silenor [doxepin hcl]; Meperidine hcl; and Sulfamethoxazole    Assessment / Plan:     Visit Diagnoses: Fibromyalgia: She is having a flare with increased pain and discomfort and increased fatigue and positive tender points. She's been under a lot of stress.  [name] disease without gangrene - All autoimmune workup negative. Her symptoms are currently not very active.  Chronic pain of both knees -she has increased pain in her bilateral knee joints. Plan: XR KNEE 3 VIEW RIGHT, XR KNEE 3 VIEW LEFT . X-rays today revealed moderate osteoarthritis of the bilateral knee joints. Today different treatment options were discussed. Patient requests cortisone injection to bilateral knee joints after informed consent was obtained both knees were prepped and strong fashion and injected with cortisone by Mr. Carlyon Shadow as described above, she tolerated the procedure well.  Cervical disc disorder with radiculopathy of cervical region - Discectomy and fusion August 2017. She has chronic pain with some dysphagia.  Spondylosis of lumbar region without myelopathy or radiculopathy: Chronic  pain   Dry mouth, over-the-counter products were discussed  INSOMNIA, CHRONIC: Good sleep hygiene was discussed.  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 XR KNEE 3 VIEW RIGHT  . XR KNEE 3 VIEW LEFT   No orders of the defined types were placed in this encounter.   Face-to-face time spent with patient was 30 minutes. 50% of time was spent in counseling and coordination of care.  Follow-Up Instructions: Return in about 6 months (around 04/09/2017) for Osteoarthritis,FMS.   Bo Merino, MD  Note - This record has been created using Editor, commissioning.  Chart creation errors have been sought, but may not always  have been located. Such creation errors do not  reflect on  the standard of medical care.

## 2016-10-06 ENCOUNTER — Encounter: Payer: Self-pay | Admitting: Family Medicine

## 2016-10-07 ENCOUNTER — Ambulatory Visit (INDEPENDENT_AMBULATORY_CARE_PROVIDER_SITE_OTHER): Payer: Medicare Other

## 2016-10-07 ENCOUNTER — Ambulatory Visit (INDEPENDENT_AMBULATORY_CARE_PROVIDER_SITE_OTHER): Payer: Medicare Other | Admitting: Rheumatology

## 2016-10-07 ENCOUNTER — Encounter: Payer: Self-pay | Admitting: Rheumatology

## 2016-10-07 VITALS — BP 128/68 | HR 84 | Resp 14 | Wt 134.0 lb

## 2016-10-07 DIAGNOSIS — G8929 Other chronic pain: Secondary | ICD-10-CM

## 2016-10-07 DIAGNOSIS — Z87440 Personal history of urinary (tract) infections: Secondary | ICD-10-CM

## 2016-10-07 DIAGNOSIS — M25562 Pain in left knee: Secondary | ICD-10-CM | POA: Diagnosis not present

## 2016-10-07 DIAGNOSIS — M25561 Pain in right knee: Secondary | ICD-10-CM

## 2016-10-07 DIAGNOSIS — R682 Dry mouth, unspecified: Secondary | ICD-10-CM

## 2016-10-07 DIAGNOSIS — Z8719 Personal history of other diseases of the digestive system: Secondary | ICD-10-CM

## 2016-10-07 DIAGNOSIS — G4700 Insomnia, unspecified: Secondary | ICD-10-CM | POA: Diagnosis not present

## 2016-10-07 DIAGNOSIS — F331 Major depressive disorder, recurrent, moderate: Secondary | ICD-10-CM

## 2016-10-07 DIAGNOSIS — M5010 Cervical disc disorder with radiculopathy, unspecified cervical region: Secondary | ICD-10-CM | POA: Diagnosis not present

## 2016-10-07 DIAGNOSIS — M47816 Spondylosis without myelopathy or radiculopathy, lumbar region: Secondary | ICD-10-CM

## 2016-10-07 DIAGNOSIS — L719 Rosacea, unspecified: Secondary | ICD-10-CM

## 2016-10-07 DIAGNOSIS — M797 Fibromyalgia: Secondary | ICD-10-CM

## 2016-10-07 DIAGNOSIS — I7300 Raynaud's syndrome without gangrene: Secondary | ICD-10-CM | POA: Diagnosis not present

## 2016-10-07 DIAGNOSIS — R7612 Nonspecific reaction to cell mediated immunity measurement of gamma interferon antigen response without active tuberculosis: Secondary | ICD-10-CM

## 2016-10-07 MED ORDER — TRIAMCINOLONE ACETONIDE 40 MG/ML IJ SUSP
40.0000 mg | INTRAMUSCULAR | Status: AC | PRN
  Administered 2016-10-07: 40 mg via INTRA_ARTICULAR

## 2016-10-07 MED ORDER — LIDOCAINE HCL 1 % IJ SOLN
1.5000 mL | INTRAMUSCULAR | Status: AC | PRN
  Administered 2016-10-07: 1.5 mL

## 2016-10-09 ENCOUNTER — Encounter: Payer: Self-pay | Admitting: Family Medicine

## 2016-10-18 ENCOUNTER — Ambulatory Visit (INDEPENDENT_AMBULATORY_CARE_PROVIDER_SITE_OTHER)
Admission: RE | Admit: 2016-10-18 | Discharge: 2016-10-18 | Disposition: A | Discharge status: Home / Self Care | Payer: Medicare Other | Source: Ambulatory Visit | Attending: Family Medicine | Admitting: Family Medicine

## 2016-10-18 ENCOUNTER — Encounter: Payer: Self-pay | Admitting: Family Medicine

## 2016-10-18 ENCOUNTER — Ambulatory Visit (INDEPENDENT_AMBULATORY_CARE_PROVIDER_SITE_OTHER): Payer: Medicare Other | Admitting: Family Medicine

## 2016-10-18 VITALS — BP 130/78 | HR 80 | Temp 98.2°F | Wt 130.5 lb

## 2016-10-18 DIAGNOSIS — J101 Influenza due to other identified influenza virus with other respiratory manifestations: Secondary | ICD-10-CM | POA: Diagnosis not present

## 2016-10-18 DIAGNOSIS — R682 Dry mouth, unspecified: Secondary | ICD-10-CM

## 2016-10-18 DIAGNOSIS — G4700 Insomnia, unspecified: Secondary | ICD-10-CM | POA: Diagnosis not present

## 2016-10-18 DIAGNOSIS — R1314 Dysphagia, pharyngoesophageal phase: Secondary | ICD-10-CM | POA: Diagnosis not present

## 2016-10-18 DIAGNOSIS — R1310 Dysphagia, unspecified: Secondary | ICD-10-CM | POA: Insufficient documentation

## 2016-10-18 DIAGNOSIS — K219 Gastro-esophageal reflux disease without esophagitis: Secondary | ICD-10-CM

## 2016-10-18 DIAGNOSIS — G8929 Other chronic pain: Secondary | ICD-10-CM | POA: Diagnosis not present

## 2016-10-18 DIAGNOSIS — F331 Major depressive disorder, recurrent, moderate: Secondary | ICD-10-CM | POA: Diagnosis not present

## 2016-10-18 MED ORDER — ONDANSETRON HCL 4 MG PO TABS: 4.0000 mg | ORAL_TABLET | Freq: Three times a day (TID) | ORAL | 1 refills | Status: DC | PRN

## 2016-10-18 MED ORDER — ESZOPICLONE 3 MG PO TABS: 3.0000 mg | ORAL_TABLET | Freq: Every day | ORAL | 0 refills | Status: DC

## 2016-10-18 NOTE — Assessment & Plan Note (Signed)
Indication for chronic opioid: chronic pain syndrome, fibromyalgia, osteoarthritis, lumbar and cervical disc disease Medication and dose: embeda 80/3.2mg , tramadol PRN # pills per month: 90, 40 Last UDS date: 10/18/2016 Pain contract signed (Y/N): Y Date narcotic database last reviewed (include red flags): 09/2016 - patient not in [place]

## 2016-10-18 NOTE — Assessment & Plan Note (Signed)
Recent influenza dx last week at [facility], outside of window for tamiflu, but started on amox 875mg  BID 10d course for concern for sinusitis. Exam today consistent with LLL rales and diffuse coarseness along with persistent wet sounding cough - will check CXR to eval for concomitant PNA as well. Pt agrees with plan. Overall improving.

## 2016-10-18 NOTE — Patient Instructions (Addendum)
Update UDS today.  I will ask Maudie Mercury to phone in generic lunesta.  Good to see you today, call us with questions.  Chest xray today.  Return as needed or in 4-6 months for follow up visit.

## 2016-10-18 NOTE — Assessment & Plan Note (Signed)
lunesta refilled. Chronic insomnia regimen includes lunesta 3mg  + 1/2 xanax nightly.

## 2016-10-18 NOTE — Assessment & Plan Note (Signed)
Thought cervical hardware related - may be chronic complaint as esophageal dilation was not helpful.

## 2016-10-18 NOTE — Assessment & Plan Note (Addendum)
Appreciate psych care. Continue current regimen. [facility] Care)

## 2016-10-18 NOTE — Assessment & Plan Note (Signed)
On nexium 40mg  BID.

## 2016-10-18 NOTE — Progress Notes (Signed)
BP 130/78   Pulse 80   Temp 98.2 F (36.8 C) (Oral)   Wt 130 lb 8 oz (59.2 kg)   LMP 07/25/1998   BMI 21.72 kg/m    CC: 4 mo f/u visit Subjective:    Patient ID: Rachel Duran, female    DOB: 1956/01/13, 61 y.o.   MRN: [id number]  HPI: [name] is a 61 y.o. female presenting on 10/18/2016 for Follow-up   Recent influenza A along with sinus infection dx on Friday 10/14/2016. Slowly recovering. Treated with amoxicillin 868m BID 10d course, tamiflu deferred. Ongoing cough, headache. Rachel Duran further fevers.   Anxiety > depression followed by psych (Dr KToy Care, on abilify and lamictal. Lithium caused dry mouth and mouth sores.  Reviewed latest notes by Dr DEstanislado Pandyand Dr SFuller Plan  Known raynaud's disease, fibromyalgia, cervical disc disease with radiculopathy and lumbar spondylosis. She did have bilateral steroid injections of knees. Discussing hyaluronic acid injections.   Dysphagia thought esophagopharyngeal related to cervical spine hardware in which case it may be a chronic issue. S/p EGD with dilation and barium esophagram. nexium was also increased to 457mBID. fmhx esophageal cancer (father).   Chronic insomnia - requests we phone in generic lunesta. Takes this with 1/2 tab xanax. Never able to sleep the night through - IC wakes her up as well.   Relevant past medical, surgical, family and social history reviewed and updated as indicated. Interim medical history since our last visit reviewed. Allergies and medications reviewed and updated. Outpatient Medications Prior to Visit  Medication Sig Dispense Refill  . acyclovir (ZOVIRAX) 800 MG tablet Take one-half tablet by  mouth every day 45 tablet 3  . ALPRAZolam (XANAX) 1 MG tablet Take 1 tablet (1 mg total) by mouth 3 (three) times daily as needed for anxiety. With 1/2 tab at night time 60 tablet 0  . amLODipine (NORVASC) 5 MG tablet TAKE 1 TABLET BY MOUTH  DAILY 90 tablet 3  . amphetamine-dextroamphetamine (ADDERALL XR) 30  MG 24 hr capsule Take 1 capsule (30 mg total) by mouth every morning. 90 capsule 0  . ARIPiprazole (ABILIFY) 5 MG tablet Take 5 mg by mouth daily.     . Marland Kitchenspirin EC 81 MG tablet Take 81 mg by mouth every other day.     . Calcium-Magnesium-Vitamin D 60527-78-242G-MG-UNIT TB24 Take 1 tablet by mouth 2 (two) times daily.    . Marland Kitcheno-enzyme Q-10 50 MG capsule Take 50 mg by mouth daily.    . cyanocobalamin (,VITAMIN B-12,) 1000 MCG/ML injection Inject 1 mL (1,000 mcg total) into the muscle every 21 ( twenty-one) days. 4 mL 3  . diclofenac sodium (VOLTAREN) 1 % GEL Apply 1 g topically 2 (two) times daily as needed (pain).     . Marland Kitchensomeprazole (NEXIUM) 40 MG capsule Take 1 capsule (40 mg total) by mouth 2 (two) times daily before a meal. 60 capsule 11  . ferrous sulfate 325 (65 FE) MG tablet Take 1 tablet (325 mg total) by mouth every Monday, Wednesday, and Friday.    . fluticasone (FLONASE) 50 MCG/ACT nasal spray 2 SPRAYS INTO BOTH NOSTRILS DAILY 16 g 6  . INDOMETHACIN RE by Other route. Customized compounded cream 10% custom care / 60 gm tube    . lamoTRIgine (LAMICTAL) 200 MG tablet Take 200 mg by mouth daily.    . Marland Kitcheninaclotide (LINZESS) 290 MCG CAPS capsule Take 1 capsule (290 mcg total) by mouth daily. 30 capsule 6  . magic mouthwash w/lidocaine  SOLN Take 5 mLs by mouth 3 (three) times daily as needed for mouth pain. 160 mL 0  . MAGNESIUM PO Take 1,000 mg by mouth 3 (three) times daily.    . Manganese 50 MG TABS Take 50 mg by mouth daily.     . Morphine-Naltrexone 80-3.2 MG CPCR Take 1 tablet by mouth daily. 90 capsule 0  . Multiple Vitamins-Minerals (ICAPS AREDS 2) CAPS Take 1 capsule by mouth daily.    . NONFORMULARY OR COMPOUNDED ITEM Testosterone propionate 2% in white petrolatum, apply once daily for 5 days per week and twice a day for 2 days per week.  Dispense:  60 grams. 60 each 0  . NONFORMULARY OR COMPOUNDED ITEM Indomethacin PLO10% AAA up to BID # 60 mL 60 each 2  . pentosan polysulfate  (ELMIRON) 100 MG capsule Take 100 mg by mouth 3 (three) times daily.    . Polyethylene Glycol 3350 (MIRALAX PO) Take 17 g by mouth 4 (four) times daily.     . pregabalin (LYRICA) 150 MG capsule Take 1 capsule (150 mg total) by mouth 3 (three) times daily. 90 capsule 6  . traMADol (ULTRAM) 50 MG tablet Take 1 tablet (50 mg total) by mouth 2 (two) times daily as needed. 40 tablet 0  . ursodiol (ACTIGALL) 300 MG capsule TAKE ONE TABLET BY MOUTH TWICE DAILY 60 capsule 5  . YUVAFEM 10 MCG TABS vaginal tablet     . Eszopiclone 3 MG TABS TAKE ONE TABLET BY MOUTH AT BEDTIME AS NEEDED *TAKE IMMEDIATELY BEFORE BEDTIME*    . ondansetron (ZOFRAN) 4 MG tablet Take 1 tablet (4 mg total) by mouth every 8 (eight) hours as needed for nausea or vomiting. 30 tablet 1  . hydrocortisone (ANUSOL-HC) 25 MG suppository Place 25 mg rectally 2 (two) times daily as needed for hemorrhoids or itching.    . valACYclovir (VALTREX) 1000 MG tablet Take 2 tablets (2,000 mg total) by mouth 2 (two) times daily. For 1 day (Patient not taking: Reported on 10/03/2016) 4 tablet 0   Facility-Administered Medications Prior to Visit  Medication Dose Route Frequency Provider Last Rate Last Dose  . 0.9 %  sodium chloride infusion  500 mL Intravenous Continuous [name], MD         Per HPI unless specifically indicated in ROS section below Review of Systems     Objective:    BP 130/78   Pulse 80   Temp 98.2 F (36.8 C) (Oral)   Wt 130 lb 8 oz (59.2 kg)   LMP 07/25/1998   BMI 21.72 kg/m   Wt Readings from Last 3 Encounters:  10/18/16 130 lb 8 oz (59.2 kg)  10/07/16 134 lb (60.8 kg)  10/03/16 134 lb (60.8 kg)    Physical Exam  Constitutional: She appears well-developed and well-nourished. Rachel Duran distress.  HENT:  Head: Normocephalic and atraumatic.  Mouth/Throat: Oropharynx is clear and moist. Rachel Duran oropharyngeal exudate.  Cardiovascular: Normal rate, regular rhythm, normal heart sounds and intact distal pulses.   Rachel Duran murmur  heard. Pulmonary/Chest: Effort normal. Rachel Duran respiratory distress. She has decreased breath sounds. She has Rachel Duran wheezes. She has rhonchi. She has rales (LLL).  Coarse breath sounds L>R lobes  Musculoskeletal: She exhibits Rachel Duran edema.  Skin: Skin is warm and dry. Rachel Duran rash noted.  Psychiatric: She has a normal mood and affect.  Nursing note and vitals reviewed.  Results for orders placed or performed in visit on 09/13/16  CBC w/Diff  Result Value Ref Range  WBC 9.4 4.0 - 10.5 K/uL   RBC 4.76 3.87 - 5.11 Mil/uL   Hemoglobin 13.4 12.0 - 15.0 g/dL   HCT 40.9 36.0 - 46.0 %   MCV 85.9 78.0 - 100.0 fl   MCHC 32.7 30.0 - 36.0 g/dL   RDW 13.8 11.5 - 15.5 %   Platelets 298.0 150.0 - 400.0 K/uL   Neutrophils Relative % 75.0 43.0 - 77.0 %   Lymphocytes Relative 17.2 12.0 - 46.0 %   Monocytes Relative 6.3 3.0 - 12.0 %   Eosinophils Relative 1.0 0.0 - 5.0 %   Basophils Relative 0.5 0.0 - 3.0 %   Neutro Abs 7.1 1.4 - 7.7 K/uL   Lymphs Abs 1.6 0.7 - 4.0 K/uL   Monocytes Absolute 0.6 0.1 - 1.0 K/uL   Eosinophils Absolute 0.1 0.0 - 0.7 K/uL   Basophils Absolute 0.0 0.0 - 0.1 K/uL  Comp Met (CMET)  Result Value Ref Range   Sodium 140 135 - 145 mEq/L   Potassium 4.8 3.5 - 5.1 mEq/L   Chloride 104 96 - 112 mEq/L   CO2 30 19 - 32 mEq/L   Glucose, Bld 112 (H) 70 - 99 mg/dL   BUN 10 6 - 23 mg/dL   Creatinine, Ser 0.62 0.40 - 1.20 mg/dL   Total Bilirubin 0.2 0.2 - 1.2 mg/dL   Alkaline Phosphatase 89 39 - 117 U/L   AST 16 0 - 37 U/L   ALT 12 0 - 35 U/L   Total Protein 7.2 6.0 - 8.3 g/dL   Albumin 4.6 3.5 - 5.2 g/dL   Calcium 9.6 8.4 - 10.5 mg/dL   GFR 104.85 >60.00 mL/min      Assessment & Plan:   Problem List Items Addressed This Visit    RESOLVED: Dry mouth    Marked improvement off lithium.      Dysphagia    Thought cervical hardware related - may be chronic complaint as esophageal dilation was not helpful.       Encounter for chronic pain management    Indication for chronic opioid:  chronic pain syndrome, fibromyalgia, osteoarthritis, lumbar and cervical disc disease Medication and dose: embeda 80/3.83m, tramadol PRN # pills per month: 90, 40 Last UDS date: 10/18/2016 Pain contract signed (Y/N): Y Date narcotic database last reviewed (include red flags): 09/2016 - patient not in [place] CSRS       GERD    On nexium 483mBID.       Relevant Medications   ondansetron (ZOFRAN) 4 MG tablet   Influenza A - Primary    Recent influenza dx last week at [facility], outside of window for tamiflu, but started on amox 8753mID 10d course for concern for sinusitis. Exam today consistent with LLL rales and diffuse coarseness along with persistent wet sounding cough - will check CXR to eval for concomitant PNA as well. Pt agrees with plan. Overall improving.       Relevant Orders   DG Chest 2 View   INSOMNIA, CHRONIC    lunesta refilled. Chronic insomnia regimen includes lunesta 3mg64m1/2 xanax nightly.       MDD (major depressive disorder), recurrent episode, moderate (HCC)[place] Appreciate psych care. Continue current regimen. ([facility]       Follow up plan: Return in about 4 months (around 02/17/2017) for follow up visit.  JaviRia Bush

## 2016-10-18 NOTE — Progress Notes (Signed)
Pre visit review using our clinic review tool, if applicable. No additional management support is needed unless otherwise documented below in the visit note. 

## 2016-10-18 NOTE — Assessment & Plan Note (Signed)
Marked improvement off lithium.

## 2016-10-19 ENCOUNTER — Encounter: Payer: Self-pay | Admitting: Family Medicine

## 2016-10-25 ENCOUNTER — Ambulatory Visit
Admission: RE | Admit: 2016-10-25 | Discharge: 2016-10-25 | Disposition: A | Discharge status: Home / Self Care | Payer: Medicare Other | Source: Ambulatory Visit | Attending: Obstetrics and Gynecology | Admitting: Obstetrics and Gynecology

## 2016-10-25 DIAGNOSIS — Z1231 Encounter for screening mammogram for malignant neoplasm of breast: Secondary | ICD-10-CM

## 2016-10-26 ENCOUNTER — Other Ambulatory Visit: Payer: Self-pay | Admitting: Obstetrics and Gynecology

## 2016-10-26 DIAGNOSIS — R928 Other abnormal and inconclusive findings on diagnostic imaging of breast: Secondary | ICD-10-CM

## 2016-11-03 ENCOUNTER — Ambulatory Visit
Admission: RE | Admit: 2016-11-03 | Discharge: 2016-11-03 | Disposition: A | Discharge status: Home / Self Care | Payer: Medicare Other | Source: Ambulatory Visit | Attending: Obstetrics and Gynecology | Admitting: Obstetrics and Gynecology

## 2016-11-03 DIAGNOSIS — R928 Other abnormal and inconclusive findings on diagnostic imaging of breast: Secondary | ICD-10-CM

## 2016-11-07 ENCOUNTER — Telehealth: Payer: Self-pay | Admitting: Gastroenterology

## 2016-11-07 NOTE — Telephone Encounter (Signed)
Left message for patient to call back  

## 2016-11-07 NOTE — Telephone Encounter (Signed)
She is an appropriate candidate for Cologuard. Please let her know that colonoscopy is more accurate for detection of colon polyps, tumors and colonoscopy can remove polyps and biopsy other findings if needed. Also several insurances do not cover Cologuard so she needs to be aware of the full price. Cologuard is every 3 years, if negative. Colonoscopy is every 10 years, if negative.

## 2016-11-07 NOTE — Telephone Encounter (Signed)
Dr. Fuller Plan patient is interested in Cologuard.  Is she an appropriate candidate? I do not see any history of polyps

## 2016-11-08 NOTE — Telephone Encounter (Signed)
Patient notified of recommendations.  She is notified to contact her insurance and make sure that cologuard is a covered benefit as an in- network lab.  She is provided all the information below.   Your provider has ordered Cologuard testing as an option for colon cancer screening. This is performed by [facility] and may be out of network with your insurance. PRIOR to completing the test, it is YOUR responsibility to contact your insurance about covered benefits for this test. Your out of pocket expense could be anywhere from $0.00 to $649.00. The CPT code to provide to your insurance carrier is [phone].  We have already sent your demographic and insurance information to [facility] (phone number [phone]) and they should contact you within the next week regarding your test. If you have not heard from them within the next week, please call our office at [phone].   She understands that she will be contacted directly by Colog[facility]ard to complete the test.

## 2016-11-10 ENCOUNTER — Encounter: Payer: Self-pay | Admitting: Family Medicine

## 2016-11-10 ENCOUNTER — Ambulatory Visit (INDEPENDENT_AMBULATORY_CARE_PROVIDER_SITE_OTHER): Payer: Medicare Other | Admitting: Family Medicine

## 2016-11-10 VITALS — BP 120/84 | HR 94 | Temp 98.6°F | Ht 65.0 in | Wt 132.8 lb

## 2016-11-10 DIAGNOSIS — L03012 Cellulitis of left finger: Secondary | ICD-10-CM | POA: Insufficient documentation

## 2016-11-10 DIAGNOSIS — Z23 Encounter for immunization: Secondary | ICD-10-CM

## 2016-11-10 HISTORY — DX: Cellulitis of left finger: L03.012

## 2016-11-10 MED ORDER — CEPHALEXIN 500 MG PO CAPS: 500.0000 mg | ORAL_CAPSULE | Freq: Three times a day (TID) | ORAL | 0 refills | Status: DC

## 2016-11-10 MED ORDER — TETANUS-DIPHTHERIA TOXOIDS TD 5-2 LFU IM INJ: 0.5000 mL | INJECTION | Freq: Once | INTRAMUSCULAR | 0 refills | Status: DC

## 2016-11-10 NOTE — Assessment & Plan Note (Signed)
I&D performed. Cover with keflex antibiotic 5d course TID. rec take with food, take with yogurt. Td updated today.  Home care reviewed -rec warm water or epsom soaks TID for next few days. Red flags to return or seek urgent care also reviewed.

## 2016-11-10 NOTE — Progress Notes (Signed)
Pre visit review using our clinic review tool, if applicable. No additional management support is needed unless otherwise documented below in the visit note. 

## 2016-11-10 NOTE — Patient Instructions (Addendum)
You did have fingernail infection - small I&D performed today.  Continue warm water or epsom salt soaks twice to three times daily over weekend.  Take antibiotic sent to pharmacy. If worsening pain/swelling, or new streaking redness, seek care over weekend.  Td shot today.   Fingertip Infection There are two main types of fingertip infections:  Paronychia. This is an infection that happens around your nail. This type of infection can start suddenly in one nail or occur gradually over time and affect more than one nail. Long-term (chronic) paronychia can make your fingernails thick and deformed.  Felon. This is a bacterial infection in the padded tip of your finger. Felon infection can cause a painful collection of pus (abscess) to form inside your fingertip. If the infection is not treated, the infection can spread as deep as the bone. What are the causes? Paronychia infection can be caused by bacteria, funguses, or a mix of both. Felon infection is usually caused by the bacteria that are normally found on your skin. An infection can develop if the bacteria spread through your skin to the pad of tissue inside your fingertip. What increases the risk? A fingertip infection is more likely to develop in people:  Who have diabetes.  Who have a weak body defense (immune) system.  Who work with their hands.  Whose hands are exposed to moisture, chemicals, or irritants for long periods of time.  Who have poor circulation.  Who bite, chew, or pick their fingernails. What are the signs or symptoms? Symptoms of paronychia may affect one or more fingernails and include:  Pain, swelling, and redness around the nail.  Pus-filled pockets at the base or side of the fingernail (cuticle).  Thick fingernails that separate from the nail bed.  Pus that drains from the nail bed. Symptoms of felon usually affect just one fingertip pad and include:  Severe, throbbing  pain.  Redness.  Swelling.  Warmth.  Tenderness when the affected fingertip is touched. How is this diagnosed? A fingertip infection is diagnosed with a medical history and physical exam. If there is pus draining from the infection, it may be swabbed and sent to the lab for a culture. An X-ray may be done to see if the infection has spread to the bone. How is this treated? Treatment for a fingertip infection may include:  Warm water or salt-water soaks several times per day.  Antibiotic medicine. This may be an ointment or pills.  Steroid ointment.  Antifungal pills.  Drainage of pus pockets. This is done by making a surgical cut (incision) to open the fingertip to drain pus.  Wearing gloves to protect your nails. Follow these instructions at home: Medicines   Take or apply over-the-counter and prescription medicines only as told by your health care provider.  If you were prescribed antibiotic medicine, take or apply it as told by your health care provider. Do not stop using the antibiotic even if your condition improves. Wound care   Clean the infected area each day with warm water or salt water, or as told by your health care provider.  Gently wash the infected area with mild soap and water.  Rinse the infected area with water to remove all soap.  Pat the infected area dry with a clean towel. Do not rub it.  To make a salt-water mixture, completely dissolve -1 tsp of salt in 1 cup of warm water.  Follow instructions from your health care provider about:  How to take care  of the infection.  When and how you should change your bandage (dressing).  When you should remove your dressing.  Check the infected area every day for more signs of infection. Watch for:  More redness, swelling, or pain.  More fluid or blood.  Warmth.  A bad smell. General instructions   Keep the dressing dry until your health care provider says it can be removed. Do not take baths,  swim, use a hot tub, or do anything that would put your wound underwater until your health care provider approves.  Raise (elevate) the infected area above the level of your heart while you are sitting or lying down or as told by your health care provider.  Do not scratch or pick at the infection.  Wear gloves as told by your health care provider, if this applies.  Keep all follow-up visits as told by your health care provider. This is important. How is this prevented?  Wear gloves when you work with your hands.  Wash your hands often with antibacterial soap.  Avoid letting your hands stay wet or irritated for long periods of time.  Do not bite your fingernails. Do not pull on your cuticles. Do not suck on your fingers.  Use clean scissors or nail clippers to trim your nails. Do not cut your fingernails very short. Contact a health care provider if:  Your pain medicine is not helping.  You have more redness, swelling, or pain at your fingertip.  You continue to have fluid, blood, or pus coming from your fingertip.  Your infection area feels warm to the touch.  You continue to notice a bad smell coming from your fingertip or your dressing. Get help right away if:  The area of redness is spreading, or you notice a red streak going away from your fingertip.  You have a fever. This information is not intended to replace advice given to you by your health care provider. Make sure you discuss any questions you have with your health care provider. Document Released: 08/18/2004 Document Revised: 12/17/2015 Document Reviewed: 12/29/2014 Elsevier Interactive Patient Education  2017 [employer].

## 2016-11-10 NOTE — Progress Notes (Signed)
BP 120/84   Pulse 94   Temp 98.6 F (37 C) (Oral)   Ht 5\' 5"  (1.651 m)   Wt 132 lb 12.8 oz (60.2 kg)   LMP 07/25/1998   SpO2 98%   BMI 22.10 kg/m    CC: Check finger tip Subjective:    Patient ID: Rachel Duran, female    DOB: 12/03/1955, 61 y.o.   MRN: [id number]  HPI: Rachel Duran is a 61 y.o. female presenting on 11/10/2016 for Finger Pain (middle finger of the left hand--noticed for 4 days--swelling--red--tender)   4d h/o L middle finger pain/swelling. Thinks she may have jammed finger into edge of countertop on [employer]. This happened Sunday. Monday noted increased pain/swelling at tip of middle finger.   No new nail polish or lotions.  No fevers/chills, nausea.  h/o seronegative arthritis and raynaud's disease. No recent raynaud's flare.  Relevant past medical, surgical, family and social history reviewed and updated as indicated. Interim medical history since our last visit reviewed. Allergies and medications reviewed and updated. Outpatient Medications Prior to Visit  Medication Sig Dispense Refill  . acyclovir (ZOVIRAX) 800 MG tablet Take one-half tablet by  mouth every day 45 tablet 3  . ALPRAZolam (XANAX) 1 MG tablet Take 1 tablet (1 mg total) by mouth 3 (three) times daily as needed for anxiety. With 1/2 tab at night time 60 tablet 0  . amLODipine (NORVASC) 5 MG tablet TAKE 1 TABLET BY MOUTH  DAILY 90 tablet 3  . amoxicillin (AMOXIL) 875 MG tablet Take 875 mg by mouth 2 (two) times daily.    Marland Kitchen amphetamine-dextroamphetamine (ADDERALL XR) 30 MG 24 hr capsule Take 1 capsule (30 mg total) by mouth every morning. 90 capsule 0  . ARIPiprazole (ABILIFY) 5 MG tablet Take 5 mg by mouth daily.     Marland Kitchen aspirin EC 81 MG tablet Take 81 mg by mouth every other day.     . Calcium-Magnesium-Vitamin D 242-35-361 MG-MG-UNIT TB24 Take 1 tablet by mouth 2 (two) times daily.    Marland Kitchen co-enzyme Q-10 50 MG capsule Take 50 mg by mouth daily.    . cyanocobalamin (,VITAMIN B-12,)  1000 MCG/ML injection Inject 1 mL (1,000 mcg total) into the muscle every 21 ( twenty-one) days. 4 mL 3  . diclofenac sodium (VOLTAREN) 1 % GEL Apply 1 g topically 2 (two) times daily as needed (pain).     Marland Kitchen esomeprazole (NEXIUM) 40 MG capsule Take 1 capsule (40 mg total) by mouth 2 (two) times daily before a meal. 60 capsule 11  . Eszopiclone 3 MG TABS Take 1 tablet (3 mg total) by mouth at bedtime. Take immediately before bedtime 90 tablet 0  . ferrous sulfate 325 (65 FE) MG tablet Take 1 tablet (325 mg total) by mouth every Monday, Wednesday, and Friday.    . fluticasone (FLONASE) 50 MCG/ACT nasal spray 2 SPRAYS INTO BOTH NOSTRILS DAILY 16 g 6  . INDOMETHACIN RE by Other route. Customized compounded cream 10% custom care / 60 gm tube    . lamoTRIgine (LAMICTAL) 200 MG tablet Take 200 mg by mouth daily.    Marland Kitchen linaclotide (LINZESS) 290 MCG CAPS capsule Take 1 capsule (290 mcg total) by mouth daily. 30 capsule 6  . magic mouthwash w/lidocaine SOLN Take 5 mLs by mouth 3 (three) times daily as needed for mouth pain. 160 mL 0  . MAGNESIUM PO Take 1,000 mg by mouth 3 (three) times daily.    . Manganese 50 MG  TABS Take 50 mg by mouth daily.     . Morphine-Naltrexone 80-3.2 MG CPCR Take 1 tablet by mouth daily. 90 capsule 0  . Multiple Vitamins-Minerals (ICAPS AREDS 2) CAPS Take 1 capsule by mouth daily.    . NONFORMULARY OR COMPOUNDED ITEM Testosterone propionate 2% in white petrolatum, apply once daily for 5 days per week and twice a day for 2 days per week.  Dispense:  60 grams. 60 each 0  . NONFORMULARY OR COMPOUNDED ITEM Indomethacin PLO10% AAA up to BID # 60 mL 60 each 2  . ondansetron (ZOFRAN) 4 MG tablet Take 1 tablet (4 mg total) by mouth every 8 (eight) hours as needed for nausea or vomiting. 30 tablet 1  . pentosan polysulfate (ELMIRON) 100 MG capsule Take 100 mg by mouth 3 (three) times daily.    . Polyethylene Glycol 3350 (MIRALAX PO) Take 17 g by mouth 4 (four) times daily.     . pregabalin  (LYRICA) 150 MG capsule Take 1 capsule (150 mg total) by mouth 3 (three) times daily. 90 capsule 6  . traMADol (ULTRAM) 50 MG tablet Take 1 tablet (50 mg total) by mouth 2 (two) times daily as needed. 40 tablet 0  . ursodiol (ACTIGALL) 300 MG capsule TAKE ONE TABLET BY MOUTH TWICE DAILY 60 capsule 5  . YUVAFEM 10 MCG TABS vaginal tablet      Facility-Administered Medications Prior to Visit  Medication Dose Route Frequency Provider Last Rate Last Dose  . 0.9 %  sodium chloride infusion  500 mL Intravenous Continuous [name], MD         Per HPI unless specifically indicated in ROS section below Review of Systems     Objective:    BP 120/84   Pulse 94   Temp 98.6 F (37 C) (Oral)   Ht 5\' 5"  (1.651 m)   Wt 132 lb 12.8 oz (60.2 kg)   LMP 07/25/1998   SpO2 98%   BMI 22.10 kg/m   Wt Readings from Last 3 Encounters:  11/10/16 132 lb 12.8 oz (60.2 kg)  10/18/16 130 lb 8 oz (59.2 kg)  10/07/16 134 lb (60.8 kg)    Physical Exam  Constitutional: She appears well-developed and well-nourished. No distress.  Musculoskeletal:  Chronic tenderness and mild erythema of DIPs throughout  Skin: Skin is warm and dry. No rash noted.  Distal medial pulp of left 3rd middle finger with abscess without surrounding erythema, no pain at base of nailbed  Nursing note and vitals reviewed.  I&D  Finger soaked and cleaned with betadine and normal saline Anesthesia achieved with ethyl chloride Using 25g needle, pus pocket opened and pus expressed Finger soaked in normal saline  Wound care instructions provided    Assessment & Plan:   Problem List Items Addressed This Visit    Felon of finger of left hand - Primary    I&D performed. Cover with keflex antibiotic 5d course TID. rec take with food, take with yogurt. Td updated today.  Home care reviewed -rec warm water or epsom soaks TID for next few days. Red flags to return or seek urgent care also reviewed.       Relevant Medications    cephALEXin (KEFLEX) 500 MG capsule    Other Visit Diagnoses    Need for Td vaccine       Relevant Orders   Td : Tetanus/diphtheria >7yo Preservative  free (Completed)       Follow up plan: Return if symptoms  worsen or fail to improve.  Ria Bush, MD

## 2016-11-14 ENCOUNTER — Other Ambulatory Visit: Payer: Self-pay | Admitting: Gastroenterology

## 2016-11-17 ENCOUNTER — Other Ambulatory Visit: Payer: Self-pay | Admitting: Family Medicine

## 2016-11-22 ENCOUNTER — Other Ambulatory Visit: Payer: Self-pay

## 2016-11-22 ENCOUNTER — Encounter: Payer: Medicare Other | Admitting: Gastroenterology

## 2016-11-22 ENCOUNTER — Telehealth: Payer: Self-pay

## 2016-11-22 LAB — COLOGUARD: Cologuard: POSITIVE

## 2016-11-22 NOTE — Telephone Encounter (Signed)
error 

## 2016-11-23 ENCOUNTER — Telehealth: Payer: Self-pay | Admitting: Gastroenterology

## 2016-11-23 NOTE — Telephone Encounter (Signed)
Report was recieved

## 2016-11-28 ENCOUNTER — Telehealth: Payer: Self-pay | Admitting: Obstetrics and Gynecology

## 2016-11-28 ENCOUNTER — Other Ambulatory Visit: Payer: Self-pay | Admitting: Obstetrics and Gynecology

## 2016-11-28 MED ORDER — NONFORMULARY OR COMPOUNDED ITEM: 0 refills | Status: DC

## 2016-11-28 NOTE — Telephone Encounter (Signed)
Med refill request: Testosterone propionate 2% Last AEX: 01/08/16 Next AEX: 01/13/17 Last MMG (if hormonal med): 11/03/16; Bi-rads 2, routine screening  Refill authorized: Please Advise?    Last testosterone level 01/08/16.

## 2016-11-28 NOTE — Telephone Encounter (Signed)
Patient would like to know why the request for her testosterone cream was denied.

## 2016-11-28 NOTE — Telephone Encounter (Signed)
Spoke with patient. Advised RX for Testosterone propionate faxed to [facility] at [phone]. Patient thankful for f/u call and will f/u with pharmacy for filling. Patient verbalizes understanding and is agreeable.   Routing to provider for final review. Patient is agreeable to disposition. Will close encounter.

## 2016-11-28 NOTE — Telephone Encounter (Signed)
I just refilled the testosterone.  It will print on the printer as this will not go through electronically.  It will need to be faxed to her pharmacy.

## 2016-12-05 ENCOUNTER — Encounter: Payer: Self-pay | Admitting: Family Medicine

## 2016-12-06 MED ORDER — AMPHETAMINE-DEXTROAMPHET ER 30 MG PO CP24: 30.0000 mg | ORAL_CAPSULE | ORAL | 0 refills | Status: DC

## 2016-12-06 MED ORDER — MORPHINE-NALTREXONE 80-3.2 MG PO CPCR: 1.0000 | ORAL_CAPSULE | Freq: Every day | ORAL | 0 refills | Status: DC

## 2016-12-06 NOTE — Telephone Encounter (Signed)
Rx printed and in CMA box.  

## 2016-12-06 NOTE — Telephone Encounter (Signed)
Pt notified Rx ready for pickup 

## 2017-01-11 ENCOUNTER — Telehealth: Payer: Self-pay | Admitting: Rheumatology

## 2017-01-11 ENCOUNTER — Other Ambulatory Visit: Payer: Self-pay | Admitting: Family Medicine

## 2017-01-11 NOTE — Telephone Encounter (Signed)
Attempted to contact the patient and left message for patient to call the office.  

## 2017-01-11 NOTE — Telephone Encounter (Signed)
Patient has been scheduled for 01/17/17 at 1 pm for hip injections.

## 2017-01-11 NOTE — Telephone Encounter (Signed)
Patient returned your call.  Please call back

## 2017-01-11 NOTE — Telephone Encounter (Signed)
Patient called and is wanting to get fit into Dr. Arlean Hopping schedule for her BIL hip pain due to her bursitis flaring up and wants injections.  She lives in [place] and is able to come 6/21 or 6/26 around 1p.m.  CB#[phone].  Thank you.

## 2017-01-11 NOTE — Telephone Encounter (Signed)
Lawyer

## 2017-01-12 NOTE — Progress Notes (Signed)
61 y.o. G30P2002 [name] here for annual exam.    Dealing with a lot of pain.   Having flare of bursitis.  Doing cortisone injections with rheumatology, Dr. Corena Pilgrim.   Neurosurgeon is Dr. Earle Gell.  Also had ruptured disc in her back.  Had surgery in her neck for a ruptured disc and feeling better about that.   Has new dx of Stargardt's disease.  Sees Dr. Lynder Parents at [facility]. This is hereditary.  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  She is having anxiety about this and will see a psychologist.  She has a lot of support from family and will be meeting with a woman who went blind as a young adult and wants to be her coach.   Using Vagifem three times weekly and wants to continue.  Still has some dryness.   Using testosterone once daily 4 - 5 times per week.  Just refilled this so she does not currently need a refill.  Occasionally has acne.   Some urinary urgency and frequency.  Has IC.   Has chronic constipation.  Had a positive Cologuard and is now doing a colonoscopy.  PCP:   Dr. Duane Lope  Patient's last menstrual period was 07/25/1998.           Sexually active: Yes.    The current method of family planning is post menopausal status.    Exercising: Yes.    walking, gentle yoga and stretching Smoker:  no  Health Maintenance: Pap:  12/19/14 WNL  Neg HR HPV History of abnormal Pap:  no MMG:  11/03/16 Left Diagnostic: BIRADS 2 Benign/density b -- see EPIC Colonoscopy:  Scheduled for January 31, 2017 BMD:   10/24/13  Result  Osteopenia TDaP: 11/10/16 HIV: done years ago -- negative Hep C: 07/16/15 Negative Screening Labs:  PCP takes care of labs   reports that she has never smoked. She has never used smokeless tobacco. She reports that she does not drink alcohol or use drugs.  Past Medical History:  Diagnosis Date  . Abdominal pain last 4 months   and nausea also  . Allergy   . Anemia   . Cervical  disc disease limited rom turning to left   hx. C6- C7 -hx. past fusion(bone graft used)  . Cholecystitis   . Chronic pain   . DDD (degenerative disc disease), lumbar 09/2015   dextroscoliosis with multilevel DDD and facet arthrosis most notable for R foraminal disc protrusion L4/5 producing severe R neural foraminal stenosis abutting R L4 nerve root, moderate spinal canal and mild lat recesss and R neural foraminal stenosis L3/4 (MRI)  . Depression    bipolar depression  . Disorders of porphyrin metabolism   . Fibromyalgia   . GERD (gastroesophageal reflux disease)   . Internal hemorrhoids   . Interstitial cystitis 06-06-12   hx.  . Irritable bowel syndrome   . PONV (postoperative nausea and vomiting)    now uses stomach blockers and no ponv  . 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/ [place] /  02/07/12 > referred to Health Dept 02/10/2012     - POS GOLD    01/31/2012    . Raynauds disease    hx.  . Seronegative arthritis    [name]  . 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 ANTERIOR CERVICAL DECOMP/DISCECTOMY FUSION  2004   C5/6, C6/7  . ANTERIOR CERVICAL DECOMP/DISCECTOMY FUSION  02/2016   C3/4, C4/5 with plating [name])  . AUGMENTATION MAMMAPLASTY Bilateral 03/25/2010  . BREAST ENHANCEMENT SURGERY  2010  . BREAST IMPLANT EXCHANGE  10/2014   exchange saline implants, B mastopexy/capsulorraphy ([facility])  . BUNIONECTOMY Bilateral yrs ago  . CESAREAN SECTION     x 1  . CHOLECYSTECTOMY  06/11/2012   Procedure: LAPAROSCOPIC CHOLECYSTECTOMY WITH INTRAOPERATIVE CHOLANGIOGRAM;  Surgeon: [name], MD,FACS;  Location: [facility] ORS;  Service: General;  Laterality: N/A;  . CYSTOSCOPY    . ERCP  05/22/2012   Procedure: ENDOSCOPIC RETROGRADE CHOLANGIOPANCREATOGRAPHY (ERCP);  Surgeon: [name], MD,FACG;  Location: [facility];  Service: Endoscopy;  Laterality: N/A;  . ERCP N/A 09/17/2013   Procedure: ENDOSCOPIC RETROGRADE CHOLANGIOPANCREATOGRAPHY (ERCP);  Surgeon: [name], MD;  Location: [facility];  Service: Endoscopy;  Laterality: N/A;  . ESOPHAGOGASTRODUODENOSCOPY  09/2016   WNL. esophagus dilated Fuller Plan)  . HEMORRHOID BANDING  09-23-13   --Dr. Greer Pickerel  . HERNIA REPAIR     inguinal  . HYSTEROSCOPY W/ ENDOMETRIAL ABLATION    . NASAL SINUS SURGERY     x5    Current Outpatient Prescriptions  Medication Sig Dispense Refill  . acyclovir (ZOVIRAX) 800 MG tablet Take one-half tablet by  mouth every day 45 tablet 3  . ALPRAZolam (XANAX) 1 MG tablet Take 1 tablet (1 mg total) by mouth 3 (three) times daily as needed for anxiety. With 1/2 tab at night time 60 tablet 0  . amLODipine (NORVASC) 5 MG tablet TAKE 1 TABLET BY MOUTH  DAILY 90 tablet 3  . amphetamine-dextroamphetamine (ADDERALL XR) 30 MG 24 hr capsule Take 1 capsule (30 mg total) by mouth every morning. 90 capsule 0  . ARIPiprazole (ABILIFY) 5 MG tablet Take 5 mg by mouth daily.     Marland Kitchen aspirin EC 81 MG tablet Take 81 mg by mouth every other day.     . Calcium-Magnesium-Vitamin D [id number] MG-MG-UNIT TB24 Take 1 tablet by mouth 2 (two) times daily.    Marland Kitchen co-enzyme Q-10 50 MG capsule Take 50 mg by mouth daily.    . cyanocobalamin (,VITAMIN B-12,) 1000 MCG/ML injection INJECT 1 ML (1,000 MCG  TOTAL) INTO THE MUSCLE  EVERY 21 ( TWENTY-ONE)  DAYS. 1 mL 6  . diclofenac sodium (VOLTAREN) 1 % GEL Apply 1 g topically 2 (two) times daily as needed (pain).     Marland Kitchen esomeprazole (NEXIUM) 40 MG capsule Take 1 capsule (40 mg total) by mouth 2 (two) times daily before a meal. 60 capsule 11  . Eszopiclone 3 MG TABS TAKE 1 TABLET BY MOUTH IMMEDIATLEY AT BEDTIME AS NEEDED 90 tablet 0  . ferrous sulfate 325 (65 FE) MG tablet Take 1 tablet (325 mg total) by mouth every Monday, Wednesday, and Friday.    . fluticasone (FLONASE) 50 MCG/ACT nasal spray 2 SPRAYS  INTO BOTH NOSTRILS DAILY 16 g 6  . INDOMETHACIN RE by Other route. Customized compounded cream 10% custom care / 60 gm tube    . lamoTRIgine (LAMICTAL) 200 MG tablet Take 200 mg by mouth daily.    Marland Kitchen linaclotide (LINZESS) 290 MCG CAPS capsule Take 1 capsule (290 mcg total) by mouth daily. 30 capsule 6  . magic mouthwash w/lidocaine SOLN Take 5 mLs by mouth 3 (three) times daily as needed for mouth pain. 160 mL 0  . MAGNESIUM PO Take 1,000 mg by mouth 3 (three) times daily.    Marland Kitchen  Manganese 50 MG TABS Take 50 mg by mouth daily.     . Morphine-Naltrexone 80-3.2 MG CPCR Take 1 tablet by mouth daily. 90 capsule 0  . Multiple Vitamins-Minerals (ICAPS AREDS 2) CAPS Take 1 capsule by mouth daily.    . NONFORMULARY OR COMPOUNDED ITEM Indomethacin PLO10% AAA up to BID # 60 mL 60 each 2  . ondansetron (ZOFRAN) 4 MG tablet Take 1 tablet (4 mg total) by mouth every 8 (eight) hours as needed for nausea or vomiting. 30 tablet 1  . pentosan polysulfate (ELMIRON) 100 MG capsule Take 100 mg by mouth 3 (three) times daily.    . Polyethylene Glycol 3350 (MIRALAX PO) Take 17 g by mouth 4 (four) times daily.     . pregabalin (LYRICA) 150 MG capsule Take 1 capsule (150 mg total) by mouth 3 (three) times daily. 90 capsule 6  . traMADol (ULTRAM) 50 MG tablet Take 1 tablet (50 mg total) by mouth 2 (two) times daily as needed. 40 tablet 0  . ursodiol (ACTIGALL) 300 MG capsule TAKE 1 CAPSULE BY MOUTH TWICE DAILY 60 capsule 2  . YUVAFEM 10 MCG TABS vaginal tablet Place 1 tablet (10 mcg total) vaginally 3 (three) times a week. 36 tablet 3   Current Facility-Administered Medications  Medication Dose Route Frequency Provider Last Rate Last Dose  . 0.9 %  sodium chloride infusion  500 mL Intravenous Continuous [name], MD        Family History  Problem Relation Age of Onset  . CAD Father 19       MI, nonsmoker  . Esophageal cancer Father 39  . Stomach cancer Father   . Scleroderma Mother   . Hypertension Mother    . Esophageal cancer Paternal Grandfather   . Stomach cancer Paternal Grandfather   . Diabetes Maternal Grandmother   . Arthritis Brother   . Stroke Neg Hx   . Colon cancer Neg Hx     ROS:  Pertinent items are noted in HPI.  Otherwise, a comprehensive ROS was negative.  Exam:   BP 108/70 (BP Location: Right Arm, Patient Position: Sitting, Cuff Size: Normal)   Pulse 88   Resp 16   Ht 5' 4.5" (1.638 m)   Wt 132 lb (59.9 kg)   LMP 07/25/1998   BMI 22.31 kg/m     General appearance: alert, cooperative and appears stated age Head: Normocephalic, without obvious abnormality, atraumatic Neck: no adenopathy, supple, symmetrical, trachea midline and thyroid normal to inspection and palpation Lungs: clear to auscultation bilaterally Breasts:  bilateral implants.  Normal appearance, no masses or tenderness, No nipple retraction or dimpling, No nipple discharge or bleeding, No axillary or supraclavicular adenopathy Heart: regular rate and rhythm Abdomen: soft, non-tender; no masses, no organomegaly Extremities: extremities normal, atraumatic, no cyanosis or edema Skin: Skin color, texture, turgor normal. No rashes or lesions Lymph nodes: Cervical, supraclavicular, and axillary nodes normal. No abnormal inguinal nodes palpated Neurologic: Grossly normal  Pelvic: External genitalia:  no lesions              Urethra:  normal appearing urethra with no masses, tenderness or lesions              Bartholins and Skenes: normal                 Vagina: normal appearing vagina with normal color and discharge, no lesions              Cervix: no lesions.Marland Kitchen  Stenosis of cervix.               Pap taken: Yes.   Bimanual Exam:  Uterus:  normal size, contour, position, consistency, mobility, non-tender              Adnexa: no mass, fullness, tenderness              Rectal exam: Yes.  .  Confirms.              Anus:  normal sphincter tone, no lesions  Chaperone was present for exam.  Assessment:    Well woman visit with normal exam. Vaginal atrophy.  Decreased libido.  Osteopenia.  Stargardt's disease.  Progressive loss of vision.   Plan: Mammogram screening discussed. Recommended self breast awareness. Pap and HR HPV as above. Guidelines for Calcium, Vitamin D, regular exercise program including cardiovascular and weight bearing exercise. Will check free and total testosterone.  Does not need refill of testosterone tx at this time. She just refilled this.  I will need to update her prescription after the level is back.  Refill of Vagifem 10 mcg pv 3 times per week.  [facility].  Patient will call to schedule. Support given.  Follow up annually and prn.    After visit summary provided.

## 2017-01-13 ENCOUNTER — Telehealth: Payer: Self-pay | Admitting: *Deleted

## 2017-01-13 ENCOUNTER — Encounter: Payer: Self-pay | Admitting: Obstetrics and Gynecology

## 2017-01-13 ENCOUNTER — Ambulatory Visit (INDEPENDENT_AMBULATORY_CARE_PROVIDER_SITE_OTHER): Payer: Medicare Other | Admitting: Obstetrics and Gynecology

## 2017-01-13 ENCOUNTER — Other Ambulatory Visit (HOSPITAL_COMMUNITY)
Admission: RE | Admit: 2017-01-13 | Discharge: 2017-01-13 | Disposition: A | Discharge status: Home / Self Care | Payer: Medicare Other | Source: Ambulatory Visit | Attending: Obstetrics and Gynecology | Admitting: Obstetrics and Gynecology

## 2017-01-13 VITALS — BP 108/70 | HR 88 | Resp 16 | Ht 64.5 in | Wt 132.0 lb

## 2017-01-13 DIAGNOSIS — Z780 Asymptomatic menopausal state: Secondary | ICD-10-CM | POA: Diagnosis not present

## 2017-01-13 DIAGNOSIS — R6882 Decreased libido: Secondary | ICD-10-CM

## 2017-01-13 DIAGNOSIS — Z01419 Encounter for gynecological examination (general) (routine) without abnormal findings: Secondary | ICD-10-CM

## 2017-01-13 MED ORDER — MAGIC MOUTHWASH W/LIDOCAINE: 5.0000 mL | Freq: Three times a day (TID) | ORAL | 0 refills | Status: DC | PRN

## 2017-01-13 MED ORDER — YUVAFEM 10 MCG VA TABS: 1.0000 | ORAL_TABLET | VAGINAL | 3 refills | Status: DC

## 2017-01-13 NOTE — Telephone Encounter (Signed)
Spoke to pt who states her grandson was recently Dx with hand, foot and mouth. She can in contact with him appx 5-6 days ago and she begin to feel bad on Tuesday. She has since developed sore throat, and bumps on her palms and mouth and the bottom of her feet. Pt is wanting to know if she is needing to be evaluated or if an Rx can be called in. She is able to come in today if she can be worked in. pls advise

## 2017-01-13 NOTE — Telephone Encounter (Signed)
Spoke with pharmacist and phoned in. Message sent through [place].

## 2017-01-13 NOTE — Patient Instructions (Addendum)

## 2017-01-13 NOTE — Telephone Encounter (Signed)
Sounds like she's contracted hand foot mouth disease - this is a viral illness that will just take time to get over. Antibiotics won't help. Would just recommend supportive care with anti inflammatory as tolerated, push fluids and rest, may use throat lozenges, salt water gargles, herbal teas and other supportive care as needed. She could come in Monday for check if desired.

## 2017-01-13 NOTE — Telephone Encounter (Signed)
Pt aware.   didn't want to make appointment for Monday  She wanted to know if dr g would call in [facility]  Pt wanted to know how long she will be contagious  Can she keep her grandson tomorrow grandchild is just getting over hand foot and mouth  Please advise through my chart

## 2017-01-15 LAB — TESTOSTERONE, FREE, DIRECT
TESTOSTERONE FREE: 0.3 pg/mL (ref 0.0–4.2)
Testosterone, total: 74.1 ng/dL

## 2017-01-16 LAB — CYTOLOGY - PAP: DIAGNOSIS: NEGATIVE

## 2017-01-17 ENCOUNTER — Encounter: Payer: Self-pay | Admitting: Rheumatology

## 2017-01-17 ENCOUNTER — Ambulatory Visit (INDEPENDENT_AMBULATORY_CARE_PROVIDER_SITE_OTHER): Payer: Medicare Other | Admitting: Rheumatology

## 2017-01-17 ENCOUNTER — Encounter: Payer: Self-pay | Admitting: Gastroenterology

## 2017-01-17 ENCOUNTER — Ambulatory Visit (AMBULATORY_SURGERY_CENTER): Payer: Self-pay | Admitting: *Deleted

## 2017-01-17 ENCOUNTER — Telehealth: Payer: Self-pay

## 2017-01-17 VITALS — Ht 64.0 in | Wt 132.8 lb

## 2017-01-17 VITALS — BP 136/84 | HR 86

## 2017-01-17 DIAGNOSIS — M797 Fibromyalgia: Secondary | ICD-10-CM | POA: Diagnosis not present

## 2017-01-17 DIAGNOSIS — M7062 Trochanteric bursitis, left hip: Secondary | ICD-10-CM

## 2017-01-17 DIAGNOSIS — M7061 Trochanteric bursitis, right hip: Secondary | ICD-10-CM | POA: Diagnosis not present

## 2017-01-17 DIAGNOSIS — R195 Other fecal abnormalities: Secondary | ICD-10-CM

## 2017-01-17 MED ORDER — TRIAMCINOLONE ACETONIDE 40 MG/ML IJ SUSP
40.0000 mg | INTRAMUSCULAR | Status: AC | PRN
  Administered 2017-01-17: 40 mg via INTRA_ARTICULAR

## 2017-01-17 MED ORDER — DICLOFENAC SODIUM 1 % TD GEL: TRANSDERMAL | 3 refills | Status: DC

## 2017-01-17 MED ORDER — LIDOCAINE HCL 2 % IJ SOLN
2.0000 mL | INTRAMUSCULAR | Status: AC | PRN
  Administered 2017-01-17: 2 mL

## 2017-01-17 NOTE — Telephone Encounter (Signed)
A prior authorization for Voltaren Gel has been submitted via cover my meds. Will update once we receive a response.   Anakin Varkey, Wayne, CPhT 3:57 PM

## 2017-01-17 NOTE — Progress Notes (Signed)
No allergies to eggs or soy. No problems with anesthesia.  Pt given Emmi instructions for colonoscopy  No oxygen use  No diet drug use  

## 2017-01-17 NOTE — Progress Notes (Signed)
Office Visit Note  Patient: Rachel Duran             Date of Birth: [birth year]           MRN: [id number]             PCP: Ria Bush, MD Referring: Ria Bush, MD Visit Date: 01/17/2017 Occupation: @GUAROCC @    Subjective:  Injections   History of Present Illness: Rachel Duran is a 61 y.o. female  Patient presents today for a work in for bilateral greater trochanteric bursa injection secondary to pain from 2 weeks. Patient's pain is affecting her sleep and all of the conservative treatments have not been helpful.   Activities of Daily Living:  Patient reports morning stiffness for 15 minutes.   Patient Reports nocturnal pain.  Difficulty dressing/grooming: Denies Difficulty climbing stairs: Reports Difficulty getting out of chair: Denies Difficulty using hands for taps, buttons, cutlery, and/or writing: Denies   Review of Systems  Constitutional: Positive for fatigue.  HENT: Negative for mouth sores and mouth dryness.   Eyes: Negative for dryness.  Respiratory: Negative for shortness of breath.   Gastrointestinal: Negative for constipation and diarrhea.  Musculoskeletal: Positive for myalgias and myalgias.  Skin: Negative for sensitivity to sunlight.  Psychiatric/Behavioral: Positive for sleep disturbance. Negative for decreased concentration.    PMFS History:  Patient Active Problem List   Diagnosis Date Noted  . Felon of finger of left hand 11/10/2016  . Influenza A 10/18/2016  . Dysphagia 10/18/2016  . Encounter for chronic pain management 10/18/2016  . Biliary stasis 09/26/2016  . Fever blister 08/18/2016  . Urinary urgency 07/05/2016  . HNP (herniated nucleus pulposus), lumbar 09/23/2015  . Sinus congestion 07/30/2015  . Iron deficiency 07/30/2015  . Health maintenance examination 06/15/2015  . 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 Sinus headache 03/09/2015  . Clavicle enlargement 12/13/2014  . Prediabetes 12/13/2014  . Plantar  fasciitis, bilateral 09/11/2014  . Advanced care planning/counseling discussion 06/10/2014  . Medicare annual wellness visit, subsequent 06/10/2014  . Abnormal thyroid function test 06/10/2014  . Chronic pain syndrome 06/10/2014  . CFS (chronic fatigue syndrome) 04/08/2014  . Calculus of bile duct 09/17/2013  . Postmenopausal atrophic vaginitis 10/19/2012  . 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 Cervical disc disorder with radiculopathy of cervical region 05/28/2010  . Synovitis and tenosynovitis 04/30/2009  . Anemia of chronic disease 03/17/2009  . CHEST PAIN UNSPECIFIED 02/28/2008  . APHTHOUS ULCERS 01/31/2008  . INSOMNIA, CHRONIC 11/09/2007  . Drug-induced constipation 10/11/2007  . ALLERGIC RHINITIS 04/12/2007  . MDD (major depressive disorder), recurrent episode, moderate ([place]) 03/05/2007  . [name] syndrome 03/05/2007  . GERD 03/05/2007  . ROSACEA 03/05/2007  . Seronegative arthritis 03/05/2007  . NEURALGIA 03/05/2007  . 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 CYSTITIS, CHRONIC INTERSTITIAL 12/06/2006  . Fibromyalgia 12/06/2006    Past Medical History:  Diagnosis Date  . Abdominal pain last 4 months   and nausea also  . Allergy   . Anemia   . Anxiety   . 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 C6- C7 -hx. past fusion(bone graft used)  . Cholecystitis   . Chronic pain   . DDD (degenerative disc disease), lumbar 09/2015   dextroscoliosis with multilevel DDD and facet arthrosis most notable for R foraminal disc protrusion L4/5 producing severe R neural foraminal stenosis abutting R L4 nerve root, moderate spinal canal and mild lat recesss and R neural foraminal stenosis L3/4 (MRI)  .  Depression    bipolar depression  . Disorders of porphyrin metabolism   . Fibromyalgia   . GERD (gastroesophageal reflux disease)   . Internal hemorrhoids   . Interstitial cystitis 06-06-12   hx.  . Irritable bowel syndrome   . PONV  (postoperative nausea and vomiting)    now uses stomach blockers and no ponv  . 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 Raynauds disease    hx.  . Seronegative arthritis    [name]  . 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 CAD Father 89       MI, nonsmoker  . Esophageal cancer Father 63  . Stomach cancer Father   . Scleroderma Mother   . Hypertension Mother   . Esophageal cancer Paternal Grandfather   . Stomach cancer Paternal Grandfather   . Diabetes Maternal Grandmother   . Arthritis Brother   . Stroke Neg Hx   . Colon cancer Neg Hx    Past Surgical History:  Procedure Laterality Date  . ANTERIOR CERVICAL DECOMP/DISCECTOMY FUSION  2004   C5/6, C6/7  . ANTERIOR CERVICAL DECOMP/DISCECTOMY FUSION  02/2016   C3/4, C4/5 with plating [name])  . AUGMENTATION MAMMAPLASTY Bilateral 03/25/2010  . BREAST ENHANCEMENT SURGERY  2010  . BREAST IMPLANT EXCHANGE  10/2014   exchange saline implants, B mastopexy/capsulorraphy ([facility])  . BUNIONECTOMY Bilateral yrs ago  . [place]   x 1  . CHOLECYSTECTOMY  06/11/2012   Procedure: LAPAROSCOPIC CHOLECYSTECTOMY WITH INTRAOPERATIVE CHOLANGIOGRAM;  Surgeon: [name], MD,FACS;  Location: [facility];  Service: General;  Laterality: N/A;  . CYSTOSCOPY    . ERCP  05/22/2012   Procedure: ENDOSCOPIC RETROGRADE CHOLANGIOPANCREATOGRAPHY (ERCP);  Surgeon: [name], MD,FACG;  Location: [facility];  Service: Endoscopy;  Laterality: N/A;  . ERCP N/A 09/17/2013   Procedure: ENDOSCOPIC RETROGRADE CHOLANGIOPANCREATOGRAPHY (ERCP);  Surgeon: [name], MD;  Location: [facility];  Service: Endoscopy;  Laterality: N/A;  . ESOPHAGOGASTRODUODENOSCOPY  09/2016   WNL. esophagus dilated  Fuller Plan)  . HEMORRHOID BANDING  09-23-13   --Dr. Greer Pickerel  . HERNIA REPAIR     inguinal  . HYSTEROSCOPY W/ ENDOMETRIAL ABLATION    . 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 She appears well-developed and well-nourished.  HENT:  Head: Normocephalic and atraumatic.  Eyes: EOM are normal. Pupils are equal, round, and reactive to light.  Cardiovascular: Normal rate, regular rhythm and normal heart sounds.  Exam reveals no gallop and no friction rub.   No murmur heard. Pulmonary/Chest: Effort normal and breath sounds normal. She has no wheezes. She has no rales.  Abdominal:  Soft. Bowel sounds are normal. She exhibits no distension. There is no tenderness. There is no guarding. No hernia.  Musculoskeletal: Normal range of motion. She exhibits no edema, tenderness or deformity.  Lymphadenopathy:    She has no cervical adenopathy.  Neurological: She is alert and oriented to person, place, and time. Coordination normal.  Skin: Skin is warm and dry. Capillary refill takes less than 2 seconds. No rash noted.  Psychiatric: She has a normal mood and affect. Her behavior is normal.  Nursing note and vitals reviewed.    Musculoskeletal Exam:  Full range of motion of all joints Grip strength is equal and strong bilaterally Fiber myalgia tender points are 18 out of 18 positive  CDAI Exam: CDAI Homunculus Exam:   Joint Counts:  CDAI Tender Joint count: 0 CDAI Swollen Joint count: 0  Global Assessments:  Patient Global  Assessment: 10 Provider Global Assessment: 10  CDAI Calculated Score: 20    Investigation: No additional findings.   Imaging: No results found.  Speciality Comments: No specialty comments available.    Procedures:  Large Joint Inj Date/Time: 01/17/2017 1:48 PM Performed by: Eliezer Lofts Authorized by: Eliezer Lofts   Consent Given by:  Patient Site marked: the procedure site was marked   Timeout: prior to procedure the correct patient, procedure, and site was verified   Indications:  Pain Location:  Hip Site:  R greater trochanter Prep: patient was prepped and draped in usual sterile fashion   Needle Size:  27 G Approach:  Superior Ultrasound Guidance: No   Fluoroscopic Guidance: No   Arthrogram: No   Medications:  40 mg triamcinolone acetonide 40 MG/ML; 2 mL lidocaine 2 % Aspiration Attempted: Yes   Aspirate amount (mL):  0 Patient tolerance:  Patient tolerated the procedure well with no immediate complications Large Joint Inj Date/Time: 01/17/2017 1:49 PM Performed by: Eliezer Lofts Authorized by: Eliezer Lofts   Consent Given by:  Patient Site marked: the procedure site was marked   Timeout: prior to procedure the correct patient, procedure, and site was verified   Indications:  Pain Location:  Hip Site:  L greater trochanter Prep: patient was prepped and draped in usual sterile fashion   Needle Size:  27 G Approach:  Superior Ultrasound Guidance: No   Fluoroscopic Guidance: No   Arthrogram: No   Medications:  40 mg triamcinolone acetonide 40 MG/ML; 2 mL lidocaine 2 % Aspiration Attempted: Yes   Aspirate amount (mL):  0 Patient tolerance:  Patient tolerated the procedure well with no immediate complications  Bilateral greater trochanteric bursa pain. Patient tolerated procedure well.   Allergies: Codeine phosphate; Cymbalta [duloxetine hcl]; Celebrex [celecoxib]; Inh [isoniazid]; Lithium; Nucynta [tapentadol]; Silenor [doxepin hcl];  Meperidine hcl; and Sulfamethoxazole   Assessment / Plan:     Visit Diagnoses: Fibromyalgia  Greater trochanteric bursitis of both hips     Orders: Orders Placed This Encounter  Procedures  . Large Joint Injection/Arthrocentesis  . Large Joint Injection/Arthrocentesis   Meds ordered this encounter  Medications  . diclofenac sodium (VOLTAREN) 1 % GEL    Sig: Voltaren Gel 3 grams to 3 large joints upto TID 3 TUBES with 3 refills    Dispense:  3 Tube    Refill:  3    Voltaren Gel 3 grams to 3 large joints upto TID 3 TUBES with 3 refills    Order Specific Question:   Supervising Provider    Answer:   Bo Merino [[id number]]    Face-to-face time spent  with patient was 30 minutes. 50% of time was spent in counseling and coordination of care.  Follow-Up Instructions: No Follow-up on file.   Eliezer Lofts, PA-C  Note - This record has been created using [employer].  Chart creation errors have been sought, but may not always  have been located. Such creation errors do not reflect on  the standard of medical care.

## 2017-01-18 ENCOUNTER — Encounter: Payer: Self-pay | Admitting: Rheumatology

## 2017-01-19 ENCOUNTER — Other Ambulatory Visit: Payer: Self-pay | Admitting: Obstetrics and Gynecology

## 2017-01-19 DIAGNOSIS — E2839 Other primary ovarian failure: Secondary | ICD-10-CM

## 2017-01-20 NOTE — Telephone Encounter (Signed)
I spoke to patient.  I reviewed that diclofenac gel was denied as it is not approved for trochanteric bursitis.  I informed patient she could try to appeal, but I do not know if they will approve the medication.  Discussed use of GoodRx coupon card to reduce cost.  Patient confirms she will be able to afford medication with GoodRx coupon card.  She plans to download card off the internet.  Patient denies any further questions or concerns at this time.   Elisabeth Most, Pharm.D., BCPS, CPP Clinical Pharmacist Pager: [phone] Phone: [phone] 01/20/2017 2:27 PM

## 2017-01-22 ENCOUNTER — Other Ambulatory Visit: Payer: Self-pay | Admitting: Family Medicine

## 2017-01-22 ENCOUNTER — Encounter: Payer: Self-pay | Admitting: Obstetrics and Gynecology

## 2017-01-31 ENCOUNTER — Encounter: Payer: Medicare Other | Admitting: Gastroenterology

## 2017-02-07 ENCOUNTER — Ambulatory Visit
Admission: RE | Admit: 2017-02-07 | Discharge: 2017-02-07 | Disposition: A | Discharge status: Home / Self Care | Payer: Medicare Other | Source: Ambulatory Visit | Attending: Obstetrics and Gynecology | Admitting: Obstetrics and Gynecology

## 2017-02-07 DIAGNOSIS — E2839 Other primary ovarian failure: Secondary | ICD-10-CM

## 2017-02-09 ENCOUNTER — Encounter: Payer: Self-pay | Admitting: Obstetrics and Gynecology

## 2017-02-22 ENCOUNTER — Encounter: Payer: Self-pay | Admitting: Family Medicine

## 2017-02-22 ENCOUNTER — Ambulatory Visit (INDEPENDENT_AMBULATORY_CARE_PROVIDER_SITE_OTHER): Payer: Medicare Other | Admitting: Family Medicine

## 2017-02-22 VITALS — BP 122/78 | HR 94 | Temp 97.8°F | Ht 64.0 in | Wt 130.0 lb

## 2017-02-22 DIAGNOSIS — R3915 Urgency of urination: Secondary | ICD-10-CM | POA: Diagnosis not present

## 2017-02-22 DIAGNOSIS — R300 Dysuria: Secondary | ICD-10-CM

## 2017-02-22 LAB — POCT URINALYSIS DIPSTICK
BILIRUBIN UA: NEGATIVE
Glucose, UA: NEGATIVE
KETONES UA: NEGATIVE
Leukocytes, UA: NEGATIVE
NITRITE UA: NEGATIVE
PROTEIN UA: NEGATIVE
RBC UA: NEGATIVE
Spec Grav, UA: 1.025 (ref 1.010–1.025)
Urobilinogen, UA: 0.2 E.U./dL
pH, UA: 6 (ref 5.0–8.0)

## 2017-02-22 MED ORDER — PHENAZOPYRIDINE HCL 100 MG PO TABS
100.0000 mg | ORAL_TABLET | Freq: Three times a day (TID) | ORAL | 0 refills | Status: DC | PRN
Start: 2017-02-22 — End: 2018-06-29

## 2017-02-22 NOTE — Patient Instructions (Signed)
Urine looking ok so this may be IC flare.  Urine culture sent.  In the meantime, push fluids and rest, tylenol as needed, pyridium 100mg  two to three times daily as needed, and avoid bladder irritants.   Interstitial Cystitis Interstitial cystitis is a condition that causes inflammation of the bladder. The bladder is a hollow organ in the lower part of your abdomen. It stores urine after the urine is made by your kidneys. With interstitial cystitis, you may have pain in the bladder area. You may also have a frequent and urgent need to urinate. The severity of interstitial cystitis can vary from person to person. You may have flare-ups of the condition, and then it may go away for a while. For many people who have this condition, it becomes a long-term problem. What are the causes? The cause of this condition is not known. What increases the risk? This condition is more likely to develop in women. What are the signs or symptoms? Symptoms of interstitial cystitis vary, and they can change over time. Symptoms may include:  Discomfort or pain in the bladder area. This can range from mild to severe. The pain may change in intensity as the bladder fills with urine or as it empties.  Pelvic pain.  An urgent need to urinate.  Frequent urination.  Pain during sexual intercourse.  Pinpoint bleeding on the bladder wall.  For women, the symptoms often get worse during menstruation. How is this diagnosed? This condition is diagnosed by evaluating your symptoms and ruling out other causes. A physical exam will be done. Various tests may be done to rule out other conditions. Common tests include:  Urine tests.  Cystoscopy. In this test, a tool that is like a very thin telescope is used to look into your bladder.  Biopsy. This involves taking a sample of tissue from the bladder wall to be examined under a microscope.  How is this treated? There is no cure for interstitial cystitis, but treatment  methods are available to control your symptoms. Work closely with your health care provider to find the treatments that will be most effective for you. Treatment options may include:  Medicines to relieve pain and to help reduce the number of times that you feel the need to urinate.  Bladder training. This involves learning ways to control when you urinate, such as: ? Urinating at scheduled times. ? Training yourself to delay urination. ? Doing exercises (Kegel exercises) to strengthen the muscles that control urine flow.  Lifestyle changes, such as changing your diet or taking steps to control stress.  Use of a device that provides electrical stimulation in order to reduce pain.  A procedure that stretches your bladder by filling it with air or fluid.  Surgery. This is rare. It is only done for extreme cases if other treatments do not help.  Follow these instructions at home:  Take medicines only as directed by your health care provider.  Use bladder training techniques as directed. ? Keep a bladder diary to find out which foods, liquids, or activities make your symptoms worse. ? Use your bladder diary to schedule bathroom trips. If you are away from home, plan to be near a bathroom at each of your scheduled times. ? Make sure you urinate just before you leave the house and just before you go to bed.  Do Kegel exercises as directed by your health care provider.  Do not drink alcohol.  Do not use any tobacco products, including cigarettes, chewing  tobacco, or electronic cigarettes. If you need help quitting, ask your health care provider.  Make dietary changes as directed by your health care provider. You may need to avoid spicy foods and foods that contain a high amount of potassium.  Limit your drinking of beverages that stimulate urination. These include soda, coffee, and tea.  Keep all follow-up visits as directed by your health care provider. This is important. Contact a  health care provider if:  Your symptoms do not get better after treatment.  Your pain and discomfort are getting worse.  You have more frequent urges to urinate.  You have a fever. Get help right away if:  You are not able to control your bladder at all. This information is not intended to replace advice given to you by your health care provider. Make sure you discuss any questions you have with your health care provider. Document Released: 03/11/2004 Document Revised: 12/17/2015 Document Reviewed: 03/18/2014 Elsevier Interactive Patient Education  [employer].

## 2017-02-22 NOTE — Progress Notes (Signed)
BP 122/78 (BP Location: Left Arm, Patient Position: Sitting, Cuff Size: Normal)   Pulse 94   Temp 97.8 F (36.6 C) (Oral)   Ht 5\' 4"  (1.626 m)   Wt 130 lb (59 kg)   LMP 07/25/1998   SpO2 97%   BMI 22.31 kg/m    CC: UTI vs IC? Subjective:    Patient ID: Rachel Duran, female    DOB: [birth year], 61 y.o.   MRN: [id number]  HPI: Rachel Duran is a 61 y.o. female presenting on 02/22/2017 for Urinary Frequency (x3 days, dysuria, pain in lower abd)   3d h/o urgency, frequency, suprapubic pressure, vaginal cramping/spasm, dysuria. Incomplete emptying. Some nausea.   Denies fevers/chills, hematuria or flank pain.  No recent abx use.  Last UTI was > 9 months ago.  She has increased water intake for this.   Known h/o IC since 2004.  Has upcoming apt with uro for bladder instillations Amalia Hailey).   Relevant past medical, surgical, family and social history reviewed and updated as indicated. Interim medical history since our last visit reviewed. Allergies and medications reviewed and updated. Outpatient Medications Prior to Visit  Medication Sig Dispense Refill  . acyclovir (ZOVIRAX) 800 MG tablet TAKE ONE-HALF TABLET BY  MOUTH EVERY DAY 45 tablet 1  . ALPRAZolam (XANAX) 1 MG tablet Take 1 tablet (1 mg total) by mouth 3 (three) times daily as needed for anxiety. With 1/2 tab at night time 60 tablet 0  . amLODipine (NORVASC) 5 MG tablet TAKE 1 TABLET BY MOUTH  DAILY 90 tablet 3  . amphetamine-dextroamphetamine (ADDERALL XR) 30 MG 24 hr capsule Take 1 capsule (30 mg total) by mouth every morning. 90 capsule 0  . ARIPiprazole (ABILIFY) 5 MG tablet Take 5 mg by mouth daily.     Marland Kitchen aspirin EC 81 MG tablet Take 81 mg by mouth every other day.     . Calcium-Magnesium-Vitamin D [id number] MG-MG-UNIT TB24 Take 1 tablet by mouth 2 (two) times daily.    . cyanocobalamin (,VITAMIN B-12,) 1000 MCG/ML injection INJECT 1 ML (1,000 MCG  TOTAL) INTO THE MUSCLE  EVERY 21 ( TWENTY-ONE)  DAYS. 1 mL 6  .  diclofenac sodium (VOLTAREN) 1 % GEL Voltaren Gel 3 grams to 3 large joints upto TID 3 TUBES with 3 refills 3 Tube 3  . esomeprazole (NEXIUM) 40 MG capsule Take 1 capsule (40 mg total) by mouth 2 (two) times daily before a meal. 60 capsule 11  . Eszopiclone 3 MG TABS TAKE 1 TABLET BY MOUTH IMMEDIATLEY AT BEDTIME AS NEEDED 90 tablet 0  . ferrous sulfate 325 (65 FE) MG tablet Take 1 tablet (325 mg total) by mouth every Monday, Wednesday, and Friday.    . fluticasone (FLONASE) 50 MCG/ACT nasal spray 2 SPRAYS INTO BOTH NOSTRILS DAILY 16 g 6  . lamoTRIgine (LAMICTAL) 200 MG tablet Take 200 mg by mouth daily.    Marland Kitchen linaclotide (LINZESS) 290 MCG CAPS capsule Take 1 capsule (290 mcg total) by mouth daily. 30 capsule 6  . magic mouthwash w/lidocaine SOLN Take 5 mLs by mouth 3 (three) times daily as needed for mouth pain. Benadryl/viscous lidocaine 2%/maalox 120 mL 0  . MAGNESIUM PO Take 1,000 mg by mouth 3 (three) times daily.    . Manganese 50 MG TABS Take 50 mg by mouth daily.     . Morphine-Naltrexone 80-3.2 MG CPCR Take 1 tablet by mouth daily. 90 capsule 0  . Multiple Vitamins-Minerals (ICAPS AREDS 2) CAPS Take  1 capsule by mouth daily.    . NONFORMULARY OR COMPOUNDED ITEM Indomethacin PLO10% AAA up to BID # 60 mL 60 each 2  . ondansetron (ZOFRAN) 4 MG tablet Take 1 tablet (4 mg total) by mouth every 8 (eight) hours as needed for nausea or vomiting. 30 tablet 1  . pentosan polysulfate (ELMIRON) 100 MG capsule Take 100 mg by mouth 3 (three) times daily.    . Polyethylene Glycol 3350 (MIRALAX PO) Take 17 g by mouth 4 (four) times daily.     . pregabalin (LYRICA) 150 MG capsule Take 1 capsule (150 mg total) by mouth 3 (three) times daily. 90 capsule 6  . traMADol (ULTRAM) 50 MG tablet Take 1 tablet (50 mg total) by mouth 2 (two) times daily as needed. 40 tablet 0  . ursodiol (ACTIGALL) 300 MG capsule TAKE 1 CAPSULE BY MOUTH TWICE DAILY 60 capsule 2  . YUVAFEM 10 MCG TABS vaginal tablet Place 1 tablet (10  mcg total) vaginally 3 (three) times a week. 36 tablet 3  . co-enzyme Q-10 50 MG capsule Take 50 mg by mouth daily.    . diclofenac sodium (VOLTAREN) 1 % GEL Apply 1 g topically 2 (two) times daily as needed (pain).      Facility-Administered Medications Prior to Visit  Medication Dose Route Frequency Provider Last Rate Last Dose  . 0.9 %  sodium chloride infusion  500 mL Intravenous Continuous [name], MD         Per HPI unless specifically indicated in ROS section below Review of Systems     Objective:    BP 122/78 (BP Location: Left Arm, Patient Position: Sitting, Cuff Size: Normal)   Pulse 94   Temp 97.8 F (36.6 C) (Oral)   Ht 5\' 4"  (1.626 m)   Wt 130 lb (59 kg)   LMP 07/25/1998   SpO2 97%   BMI 22.31 kg/m   Wt Readings from Last 3 Encounters:  02/22/17 130 lb (59 kg)  01/13/17 132 lb (59.9 kg)  11/10/16 132 lb 12.8 oz (60.2 kg)    Physical Exam  Constitutional: She appears well-developed and well-nourished. No distress.  HENT:  Mouth/Throat: Oropharynx is clear and moist. No oropharyngeal exudate.  Abdominal: Soft. Normal appearance and bowel sounds are normal. She exhibits no distension. There is no tenderness. There is no rigidity, no rebound, no guarding, no CVA tenderness and negative Murphy's sign.  Musculoskeletal: She exhibits no edema.  Nursing note and vitals reviewed.  Results for orders placed or performed in visit on 02/22/17  POCT urinalysis dipstick  Result Value Ref Range   Color, UA yellow    Clarity, UA clear    Glucose, UA neg    Bilirubin, UA neg    Ketones, UA neg    Spec Grav, UA 1.025 1.010 - 1.025   Blood, UA neg    pH, UA 6.0 5.0 - 8.0   Protein, UA neg    Urobilinogen, UA 0.2 0.2 or 1.0 E.U./dL   Nitrite, UA neg    Leukocytes, UA Negative Negative      Assessment & Plan:   Problem List Items Addressed This Visit    Urinary urgency - Primary    UA clear - anticipate IC flare. Will Rx pyridium over next few days and send  Ucx to r/o infection. Reviewed avoiding bladder irritants. Pt has upcoming appt with uro for bladder instillations to treat IC.       Relevant Orders   POCT  urinalysis dipstick (Completed)    Other Visit Diagnoses    Dysuria       Relevant Orders   Urine Culture       Follow up plan: Return if symptoms worsen or fail to improve.  Ria Bush, MD

## 2017-02-22 NOTE — Assessment & Plan Note (Signed)
UA clear - anticipate IC flare. Will Rx pyridium over next few days and send Ucx to r/o infection. Reviewed avoiding bladder irritants. Pt has upcoming appt with uro for bladder instillations to treat IC.

## 2017-02-24 LAB — URINE CULTURE

## 2017-03-01 ENCOUNTER — Encounter: Payer: Self-pay | Admitting: Family Medicine

## 2017-03-01 ENCOUNTER — Encounter: Payer: Self-pay | Admitting: Obstetrics and Gynecology

## 2017-03-01 MED ORDER — MORPHINE-NALTREXONE 80-3.2 MG PO CPCR
1.0000 | ORAL_CAPSULE | Freq: Every day | ORAL | 0 refills | Status: DC
Start: 2017-03-01 — End: 2017-06-05

## 2017-03-01 NOTE — Telephone Encounter (Signed)
Last filled 12/06/16... Please advise

## 2017-03-01 NOTE — Telephone Encounter (Signed)
Printed and in [facility] box 

## 2017-03-02 NOTE — Telephone Encounter (Signed)
Rx left in front office for pick up and pt is aware  

## 2017-03-03 ENCOUNTER — Other Ambulatory Visit: Payer: Self-pay | Admitting: Obstetrics and Gynecology

## 2017-03-03 ENCOUNTER — Telehealth: Payer: Self-pay

## 2017-03-03 MED ORDER — NONFORMULARY OR COMPOUNDED ITEM: 1 refills | Status: DC

## 2017-03-03 NOTE — Telephone Encounter (Signed)
Patient was last seen on 01/13/17 for aex at that time she did not need testosterone rx as it was just filled. Last refill given on 11/28/16. Testosterone propionate 2% in white petrolatum, apply once daily for 5 days per week and twice a day for 2 days per week dispense 60 grams 0RF. Patient requesting to use once per day. Okay to refill?  Non-Urgent Medical Question  Message [id number]  From MARYSOL WELLNITZ To Nunzio Cobbs, MD Sent 03/01/2017 1:04 PM  Hi Dr. Quincy Simmonds hope your doing well! I was supposed to let you know when I needed my testosterone ointment refilled, would you please send a script for that over to [facility] on [address], that is where I always get it. I believe we decided to keep the dosing instructions the same as I had been doing which was once a day, 7 days per week. If you have any questions please just let me know. Thank you so much!   Juliann Pulse   Responsible Party   Pool - [facility] Clinical Pool Message taken by Gwendlyn Deutscher, RN on 03/03/2017 10:48 AM  No actions have been taken on this message.

## 2017-03-03 NOTE — Telephone Encounter (Signed)
Telephone encounter created to review with Dr.Silva. 

## 2017-03-03 NOTE — Telephone Encounter (Signed)
I filled the testosterone propionate 2% in petrolatum, apply once daily.  Rx will need to be faxed on Monday.

## 2017-03-06 NOTE — Telephone Encounter (Signed)
Rx for Testosterone Propionate faxed to [facility].

## 2017-03-12 ENCOUNTER — Encounter: Payer: Self-pay | Admitting: Family Medicine

## 2017-03-13 MED ORDER — AMPHETAMINE-DEXTROAMPHET ER 30 MG PO CP24: 30.0000 mg | ORAL_CAPSULE | ORAL | 0 refills | Status: DC

## 2017-03-13 NOTE — Telephone Encounter (Signed)
adderall printed and in [place] box for patient to pick up.

## 2017-03-14 ENCOUNTER — Encounter: Payer: Self-pay | Admitting: Family Medicine

## 2017-03-16 ENCOUNTER — Telehealth: Payer: Self-pay

## 2017-03-16 ENCOUNTER — Telehealth: Payer: Self-pay | Admitting: Rheumatology

## 2017-03-16 ENCOUNTER — Encounter: Payer: Self-pay | Admitting: Family Medicine

## 2017-03-16 NOTE — Telephone Encounter (Signed)
fyi - lyrica Rx in chart was a mistake on our end from 08/2016. That prescription was voided at our office. She has been receiving lyrica from rheumatology. Sorry about the mix up.

## 2017-03-16 NOTE — Telephone Encounter (Signed)
Patient calling back due to gp's nurse calling us. Renna left a message with [place] per pt. Error on their end. Patient wants this cleared up today. Please call back to advise.

## 2017-03-16 NOTE — Telephone Encounter (Signed)
Patient requesting a rx for Lyrica 150mg  to sent in today if at all possible. Patient is going out of town, and did not realize she had not refills. Please send to [facility].

## 2017-03-16 NOTE — Telephone Encounter (Signed)
Pt said she had never gotten lyrica from Dr Danise Mina; Dr Estanislado Pandy has always filled the Lyrica; pt spoke with Seth Bake earlier today and was advised for pt to ck with Dr Danise Mina. I advised pt Lyrica was printed in error and the Lyrica rx was cancelled. Pt request me to call Seth Bake. I left v/m requesting Seth Bake to call [facility]. Pt is going to be going out of town and pt will try to contact [facility] as well. See pt email note in pts chart dated 08/29/16.

## 2017-03-16 NOTE — Telephone Encounter (Addendum)
Patient states she has always been prescribed her Lyrica by our office. In Epic it shows a prescription written for the Lyrica by Dr. Danise Mina with 6 refills. Patient states she only had enough left for 5 days and she is leaving town. Patient was unpleasant on the phone while I explained to her that I would have to look into it before sending in the medication.

## 2017-03-17 MED ORDER — PREGABALIN 150 MG PO CAPS: 150.0000 mg | ORAL_CAPSULE | Freq: Three times a day (TID) | ORAL | 0 refills | Status: DC

## 2017-03-17 NOTE — Telephone Encounter (Signed)
See phone note at Dr Arlean Hopping office; request taken care of.

## 2017-03-17 NOTE — Telephone Encounter (Signed)
Last Visit: 01/17/17 Next Visit: 04/10/17  Okay to refill per Dr. Estanislado Pandy

## 2017-03-17 NOTE — Telephone Encounter (Signed)
Left message to advise patient prescription has been faxed to the pharmacy.

## 2017-03-20 ENCOUNTER — Other Ambulatory Visit: Payer: Self-pay | Admitting: Family Medicine

## 2017-03-20 ENCOUNTER — Other Ambulatory Visit: Payer: Self-pay | Admitting: Gastroenterology

## 2017-03-20 NOTE — Telephone Encounter (Signed)
Received refill request electronically Last refill 10/18/16 #30/1 Last office visit 02/22/17

## 2017-03-21 ENCOUNTER — Ambulatory Visit (INDEPENDENT_AMBULATORY_CARE_PROVIDER_SITE_OTHER): Payer: Medicare Other | Admitting: Family Medicine

## 2017-03-21 ENCOUNTER — Encounter: Payer: Self-pay | Admitting: Family Medicine

## 2017-03-21 ENCOUNTER — Other Ambulatory Visit: Payer: Self-pay | Admitting: Neurosurgery

## 2017-03-21 VITALS — BP 112/72 | HR 97 | Temp 98.3°F | Wt 131.8 lb

## 2017-03-21 DIAGNOSIS — G8929 Other chronic pain: Secondary | ICD-10-CM

## 2017-03-21 DIAGNOSIS — M1380 Other specified arthritis, unspecified site: Secondary | ICD-10-CM

## 2017-03-21 DIAGNOSIS — M5010 Cervical disc disorder with radiculopathy, unspecified cervical region: Secondary | ICD-10-CM | POA: Diagnosis not present

## 2017-03-21 DIAGNOSIS — B351 Tinea unguium: Secondary | ICD-10-CM

## 2017-03-21 DIAGNOSIS — E611 Iron deficiency: Secondary | ICD-10-CM

## 2017-03-21 DIAGNOSIS — M797 Fibromyalgia: Secondary | ICD-10-CM

## 2017-03-21 DIAGNOSIS — M5126 Other intervertebral disc displacement, lumbar region: Secondary | ICD-10-CM | POA: Diagnosis not present

## 2017-03-21 DIAGNOSIS — G9332 Myalgic encephalomyelitis/chronic fatigue syndrome: Secondary | ICD-10-CM

## 2017-03-21 DIAGNOSIS — R5382 Chronic fatigue, unspecified: Secondary | ICD-10-CM | POA: Diagnosis not present

## 2017-03-21 DIAGNOSIS — M545 Low back pain: Principal | ICD-10-CM

## 2017-03-21 LAB — FERRITIN: FERRITIN: 50.8 ng/mL (ref 10.0–291.0)

## 2017-03-21 LAB — HEPATIC FUNCTION PANEL
ALT: 31 U/L (ref 0–35)
AST: 29 U/L (ref 0–37)
Albumin: 4.5 g/dL (ref 3.5–5.2)
Alkaline Phosphatase: 81 U/L (ref 39–117)
BILIRUBIN TOTAL: 0.3 mg/dL (ref 0.2–1.2)
Bilirubin, Direct: 0.1 mg/dL (ref 0.0–0.3)
TOTAL PROTEIN: 6.8 g/dL (ref 6.0–8.3)

## 2017-03-21 MED ORDER — TRAMADOL HCL 50 MG PO TABS: 50.0000 mg | ORAL_TABLET | Freq: Two times a day (BID) | ORAL | 0 refills | Status: DC | PRN

## 2017-03-21 NOTE — Assessment & Plan Note (Signed)
Currently in flare. Letter for fast pass written for patient.

## 2017-03-21 NOTE — Patient Instructions (Signed)
Labs today Tramadol refilled I hope you have a safe trip! Letter provided today.  Return in November for physical.

## 2017-03-21 NOTE — Assessment & Plan Note (Signed)
Under treatment by derm with diflucan. Pt states derm requested LFTs - will fax attn Dr Jari Pigg fax [phone].

## 2017-03-21 NOTE — Assessment & Plan Note (Signed)
Compliant with iron MWF. Will update ferritin to see if ongoing need.

## 2017-03-21 NOTE — Assessment & Plan Note (Addendum)
On embeda and tramadol PRN. Tramadol refilled.  [place] CSRS reviewed and appropriate.

## 2017-03-21 NOTE — Progress Notes (Signed)
BP 112/72   Pulse 97   Temp 98.3 F (36.8 C) (Oral)   Wt 131 lb 12 oz (59.8 kg)   LMP 07/25/1998   SpO2 97%   BMI 22.61 kg/m    CC: 5 mo f/u visit Subjective:    Patient ID: Rachel Duran, female    DOB: 12/15/1955, 61 y.o.   MRN: [id number]  HPI: Rachel Duran is a 61 y.o. female presenting on 03/21/2017 for Follow-up   Upcoming trip to [facility] to [place] with daughter and her family. Planning to fly down. Requests letter for fast pass for theme parks.   Interstitial cystitis - receiving bladder instillations (elmiron, sodium bicarb, and lidocaine) through Dr Rebekah Chesterfield office at [facility].   Anxiety and depression followed by psych Dr Toy Care - on abilify and lamictal.  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 S/p cortisone injections into knees end of June by Dr Estanislado Pandy. Has upcoming appt mid September with rheum and has f/u MRI for lumbar spine by neurosurgery (Dr [name]).   FM flare - has needed increased ultram Rx. Requests script refill. Last filled 06/2016.   Chronic insomnia - on generic lunesta with 1/2 tab xanax.   She requests LFTs checked as she's on diflucan 3 month course by dermatology for onychomycosis - so far 3 wks into course.   Continues iron MWF.   Requests labs faxed to Derm Dr Jari Pigg fax [phone].   Relevant past medical, surgical, family and social history reviewed and updated as indicated. Interim medical history since our last visit reviewed. Allergies and medications reviewed and updated. Outpatient Medications Prior to Visit  Medication Sig Dispense Refill  . acyclovir (ZOVIRAX) 800 MG tablet TAKE ONE-HALF TABLET BY  MOUTH EVERY DAY 45 tablet 1  . ALPRAZolam (XANAX) 1 MG tablet Take 1 tablet (1 mg total) by mouth 3 (three) times daily as needed for anxiety. With 1/2 tab at night time 60 tablet 0  . amLODipine (NORVASC) 5 MG tablet TAKE 1 TABLET BY MOUTH  DAILY 90 tablet 3  .  amphetamine-dextroamphetamine (ADDERALL XR) 30 MG 24 hr capsule Take 1 capsule (30 mg total) by mouth every morning. 90 capsule 0  . ARIPiprazole (ABILIFY) 5 MG tablet Take 5 mg by mouth daily.     Marland Kitchen aspirin EC 81 MG tablet Take 81 mg by mouth every other day.     . Calcium-Magnesium-Vitamin D [id number] MG-MG-UNIT TB24 Take 1 tablet by mouth 2 (two) times daily.    . cyanocobalamin (,VITAMIN B-12,) 1000 MCG/ML injection INJECT 1 ML (1,000 MCG  TOTAL) INTO THE MUSCLE  EVERY 21 ( TWENTY-ONE)  DAYS. 1 mL 6  . diclofenac sodium (VOLTAREN) 1 % GEL Voltaren Gel 3 grams to 3 large joints upto TID 3 TUBES with 3 refills 3 Tube 3  . esomeprazole (NEXIUM) 40 MG capsule Take 1 capsule (40 mg total) by mouth 2 (two) times daily before a meal. 60 capsule 11  . Eszopiclone 3 MG TABS TAKE 1 TABLET BY MOUTH IMMEDIATLEY AT BEDTIME AS NEEDED 90 tablet 0  . ferrous sulfate 325 (65 FE) MG tablet Take 1 tablet (325 mg total) by mouth every Monday, Wednesday, and Friday.    . fluticasone (FLONASE) 50 MCG/ACT nasal spray 2 SPRAYS INTO BOTH NOSTRILS DAILY 16 g 6  . lamoTRIgine (LAMICTAL) 200 MG tablet Take 200 mg by mouth daily.    Marland Kitchen linaclotide (LINZESS) 290 MCG CAPS capsule Take 1 capsule (290 mcg  total) by mouth daily. 30 capsule 6  . magic mouthwash w/lidocaine SOLN Take 5 mLs by mouth 3 (three) times daily as needed for mouth pain. Benadryl/viscous lidocaine 2%/maalox 120 mL 0  . MAGNESIUM PO Take 1,000 mg by mouth 3 (three) times daily.    . Manganese 50 MG TABS Take 50 mg by mouth daily.     . Morphine-Naltrexone 80-3.2 MG CPCR Take 1 tablet by mouth daily. 90 capsule 0  . Multiple Vitamins-Minerals (ICAPS AREDS 2) CAPS Take 1 capsule by mouth daily.    . NONFORMULARY OR COMPOUNDED ITEM Testosterone propionate 2% in white petrolatum, apply once daily. 60 each 1  . ondansetron (ZOFRAN) 4 MG tablet Take 1 tablet (4 mg total) by mouth every 8 (eight) hours as needed for nausea or vomiting. 30 tablet 1  . pentosan  polysulfate (ELMIRON) 100 MG capsule Take 100 mg by mouth 3 (three) times daily.    . phenazopyridine (PYRIDIUM) 100 MG tablet Take 1 tablet (100 mg total) by mouth 3 (three) times daily as needed for pain. 20 tablet 0  . Polyethylene Glycol 3350 (MIRALAX PO) Take 17 g by mouth 4 (four) times daily.     . pregabalin (LYRICA) 150 MG capsule Take 1 capsule (150 mg total) by mouth 3 (three) times daily. 270 capsule 0  . ursodiol (ACTIGALL) 300 MG capsule TAKE 1 CAPSULE TWICE DAILY 60 capsule 1  . YUVAFEM 10 MCG TABS vaginal tablet Place 1 tablet (10 mcg total) vaginally 3 (three) times a week. 36 tablet 3  . traMADol (ULTRAM) 50 MG tablet Take 1 tablet (50 mg total) by mouth 2 (two) times daily as needed. 40 tablet 0   Facility-Administered Medications Prior to Visit  Medication Dose Route Frequency Provider Last Rate Last Dose  . 0.9 %  sodium chloride infusion  500 mL Intravenous Continuous [name], MD         Per HPI unless specifically indicated in ROS section below Review of Systems     Objective:    BP 112/72   Pulse 97   Temp 98.3 F (36.8 C) (Oral)   Wt 131 lb 12 oz (59.8 kg)   LMP 07/25/1998   SpO2 97%   BMI 22.61 kg/m   Wt Readings from Last 3 Encounters:  03/21/17 131 lb 12 oz (59.8 kg)  02/22/17 130 lb (59 kg)  01/13/17 132 lb (59.9 kg)    Physical Exam  Constitutional: She appears well-developed and well-nourished. No distress.  HENT:  Mouth/Throat: Oropharynx is clear and moist. No oropharyngeal exudate.  Eyes: Pupils are equal, round, and reactive to light. Conjunctivae are normal.  Cardiovascular: Normal rate, regular rhythm, normal heart sounds and intact distal pulses.   No murmur heard. Pulmonary/Chest: Effort normal and breath sounds normal. No respiratory distress. She has no wheezes. She has no rales.  Musculoskeletal: She exhibits no edema.  Psychiatric: She has a normal mood and affect.  Nursing note and vitals reviewed.  Results for orders  placed or performed in visit on 02/22/17  Urine Culture  Result Value Ref Range   Organism ID, Bacteria      Single organism less than 10,000 CFU/mL isolated. These organisms,commonly found on external and internal genitalia,are considered colonizers. No further testing performed.   POCT urinalysis dipstick  Result Value Ref Range   Color, UA yellow    Clarity, UA clear    Glucose, UA neg    Bilirubin, UA neg    Ketones, UA  neg    Spec Grav, UA 1.025 1.010 - 1.025   Blood, UA neg    pH, UA 6.0 5.0 - 8.0   Protein, UA neg    Urobilinogen, UA 0.2 0.2 or 1.0 E.U./dL   Nitrite, UA neg    Leukocytes, UA Negative Negative      Assessment & Plan:   Problem List Items Addressed This Visit    Cervical disc disorder with radiculopathy of cervical region   CFS (chronic fatigue syndrome)   Encounter for chronic pain management - Primary    On embeda and tramadol PRN. Tramadol refilled.  [place] CSRS reviewed and appropriate.       Fibromyalgia    Currently in flare. Letter for fast pass written for patient.       HNP (herniated nucleus pulposus), lumbar   Iron deficiency    Compliant with iron MWF. Will update ferritin to see if ongoing need.       Relevant Orders   Ferritin   Onychomycosis    Under treatment by derm with diflucan. Pt states derm requested LFTs - will fax attn Dr Jari Pigg fax [phone].       Relevant Medications   fluconazole (DIFLUCAN) 200 MG tablet   ciclopirox (PENLAC) 8 % solution   Other Relevant Orders   Hepatic function panel   Seronegative arthritis   Relevant Medications   traMADol (ULTRAM) 50 MG tablet       Follow up plan: No Follow-up on file.  Ria Bush, MD

## 2017-03-22 ENCOUNTER — Other Ambulatory Visit: Payer: Self-pay | Admitting: Rheumatology

## 2017-03-22 ENCOUNTER — Telehealth: Payer: Self-pay | Admitting: Rheumatology

## 2017-03-22 NOTE — Telephone Encounter (Signed)
Patient is requesting refill of indomethacin to be sent to [facility]. Patient is going out of town and needs to pick this up tomorrow.

## 2017-03-22 NOTE — Telephone Encounter (Addendum)
INDOMETHACIN PLO (60ML) 10% GEL Not Available 192599 No  Written Date Last Fill Date Quantity Days Supply  09/20/2016 12/15/2016 60 mL 20  Sig Notes  Apply to affected area up to twice a day. Not Available   Last Visit: 01/17/17 Next Visit: 04/10/17  Okay to refill per Dr. Estanislado Pandy. Gave verbal authorization to refill prescription.

## 2017-03-22 NOTE — Telephone Encounter (Signed)
Advised patient I have contacted the pharmacy and verified requested information. [facility] wanted to be sure that the prescription was faxed from our office.

## 2017-03-22 NOTE — Telephone Encounter (Signed)
Patient calling due to Lyrica 150mg . [facility] could not read rx. Needs [place] to call in Rx [phone] only call this number. Patient needs this before she leaves out of town. Patient states she wants a call back when this is called in for her. She is very concerned about getting her medicine, and no one ever calls her when rxs are called in.

## 2017-04-03 DIAGNOSIS — M5030 Other cervical disc degeneration, unspecified cervical region: Secondary | ICD-10-CM | POA: Insufficient documentation

## 2017-04-03 DIAGNOSIS — M5136 Other intervertebral disc degeneration, lumbar region: Secondary | ICD-10-CM | POA: Insufficient documentation

## 2017-04-03 NOTE — Progress Notes (Signed)
Office Visit Note  Patient: Rachel Duran             Date of Birth: 1956/07/23           MRN: [id number]             PCP: Ria Bush, MD Referring: Ria Bush, MD Visit Date: 04/10/2017 Occupation: @GUAROCC @    Subjective:  Pain in multiple joints.   History of Present Illness: Rachel Duran is a 61 y.o. female with history of osteoarthritis, disc disease and fibromyalgia syndrome. She states she's been having increased pain and discomfort. She states despite taking pain medication she is in constant pain. She is also having nocturnal pain. She has lot discomfort in her bilateral trochanteric area. She had trochanteric bursae injections in June 2018 but they lasted only for about 5 weeks and the pain has recurred now. She's been having discomfort in her bilateral knee joints. She continues to have some discomfort in her lower back. She is generalized pain from fibromyalgia as well.  Activities of Daily Living:  Patient reports morning stiffness for  3 hours.   Patient Reports nocturnal pain. Not sleeping due to knees hips and low back pain. Will see Neurosurgeon soon.  Difficulty dressing/grooming: Denies Difficulty climbing stairs: Reports Difficulty getting out of chair: Reports Difficulty using hands for taps, buttons, cutlery, and/or writing: Denies   Review of Systems  Constitutional: Negative.  Negative for fatigue, night sweats, weight gain, weight loss and weakness.  HENT: Negative.  Negative for mouth sores, trouble swallowing, trouble swallowing, mouth dryness and nose dryness.   Eyes: Negative for pain, redness, visual disturbance and dryness.       Has stargarts disease has regular eye exams  Respiratory: Negative.  Negative for cough, shortness of breath and difficulty breathing.   Cardiovascular: Negative.  Negative for chest pain, palpitations, hypertension, irregular heartbeat and swelling in legs/feet.  Gastrointestinal: Negative.  Negative  for blood in stool, constipation and diarrhea.  Endocrine: Negative.  Negative for increased urination.  Genitourinary: Positive for nocturia and pelvic pain. Negative for vaginal dryness.       IC / has pain from this   Musculoskeletal: Positive for arthralgias, joint pain, myalgias, morning stiffness and myalgias. Negative for joint swelling, muscle weakness and muscle tenderness.  Skin: Negative.  Negative for color change, rash, hair loss, skin tightness, ulcers and sensitivity to sunlight.  Allergic/Immunologic: Negative for susceptible to infections.  Neurological: Negative.  Negative for dizziness, memory loss and night sweats.  Hematological: Negative.  Negative for swollen glands.  Psychiatric/Behavioral: Positive for depressed mood and sleep disturbance. Negative for suicidal ideas. The patient is not nervous/anxious.     PMFS History:  Patient Active Problem List   Diagnosis Date Noted  . DDD (degenerative disc disease), cervical 04/03/2017  . DDD (degenerative disc disease), lumbar 04/03/2017  . Onychomycosis 03/21/2017  . Influenza A 10/18/2016  . Dysphagia 10/18/2016  . Encounter for chronic pain management 10/18/2016  . Biliary stasis 09/26/2016  . Fever blister 08/18/2016  . Urinary urgency 07/05/2016  . HNP (herniated nucleus pulposus), lumbar 09/23/2015  . Sinus congestion 07/30/2015  . Iron deficiency 07/30/2015  . Health maintenance examination 06/15/2015  . 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 Sinus headache 03/09/2015  . Clavicle enlargement 12/13/2014  . Prediabetes 12/13/2014  . Plantar fasciitis, bilateral 09/11/2014  . Advanced care planning/counseling discussion 06/10/2014  . Medicare annual wellness visit, subsequent 06/10/2014  . Abnormal thyroid function test 06/10/2014  . Chronic pain  syndrome 06/10/2014  . CFS (chronic fatigue syndrome) 04/08/2014  . Calculus of bile duct 09/17/2013  . Postmenopausal atrophic vaginitis 10/19/2012  . 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 Cervical disc disorder with radiculopathy of cervical region 05/28/2010  . Synovitis and tenosynovitis 04/30/2009  . Anemia of chronic disease 03/17/2009  . CHEST PAIN UNSPECIFIED 02/28/2008  . APHTHOUS ULCERS 01/31/2008  . INSOMNIA, CHRONIC 11/09/2007  . Drug-induced constipation 10/11/2007  . ALLERGIC RHINITIS 04/12/2007  . MDD (major depressive disorder), recurrent episode, moderate ([place]) 03/05/2007  . [name] syndrome 03/05/2007  . GERD 03/05/2007  . ROSACEA 03/05/2007  . NEURALGIA 03/05/2007  . 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 CYSTITIS, CHRONIC INTERSTITIAL 12/06/2006  . Fibromyalgia 12/06/2006    Past Medical History:  Diagnosis Date  . Abdominal pain last 4 months   and nausea also  . Allergy   . Anemia   . Anxiety   . 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 C6- C7 -hx. past fusion(bone graft used)  . Cholecystitis   . Chronic pain   . DDD (degenerative disc disease), lumbar 09/2015   dextroscoliosis with multilevel DDD and facet arthrosis most notable for R foraminal disc protrusion L4/5 producing severe R neural foraminal stenosis abutting R L4 nerve root, moderate spinal canal and mild lat recesss and R neural foraminal stenosis L3/4 (MRI)  . Depression    bipolar depression  . Disorders of porphyrin metabolism   . [place]on of finger of left hand 11/10/2016  . Fibromyalgia   . GERD (gastroesophageal reflux disease)   . Internal hemorrhoids   . Interstitial cystitis 06-06-12   hx.  . Irritable bowel syndrome   . PONV (postoperative nausea and vomiting)    now uses stomach blockers and no ponv  . 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 Raynauds disease    hx.  . Seronegative arthritis    Kristelle Cavallaro  . 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 Stargardt's disease     Family History  Problem Relation Age of Onset  . CAD Father 2       MI, nonsmoker  . Esophageal cancer Father 38  . Stomach cancer Father   . Scleroderma Mother   . Hypertension Mother   . Esophageal cancer Paternal Grandfather   . Stomach cancer Paternal Grandfather   . Diabetes Maternal Grandmother   . Arthritis Brother   . Stroke Neg Hx   . Colon cancer Neg Hx    Past Surgical History:  Procedure Laterality Date  . ANTERIOR CERVICAL DECOMP/DISCECTOMY FUSION  2004   C5/6, C6/7  . ANTERIOR CERVICAL DECOMP/DISCECTOMY FUSION  02/2016   C3/4, C4/5 with plating [name])  . AUGMENTATION MAMMAPLASTY Bilateral 03/25/2010  . BREAST ENHANCEMENT SURGERY  2010  . BREAST IMPLANT EXCHANGE  10/2014   exchange saline implants, B mastopexy/capsulorraphy ([facility])  . BUNIONECTOMY Bilateral yrs ago  . [place]   x 1  . CHOLECYSTECTOMY  06/11/2012   Procedure: LAPAROSCOPIC CHOLECYSTECTOMY WITH INTRAOPERATIVE CHOLANGIOGRAM;  Surgeon: [name], MD,FACS;  Location: [facility];  Service: General;  Laterality: N/A;  . CYSTOSCOPY    . ERCP  05/22/2012   Procedure: ENDOSCOPIC RETROGRADE CHOLANGIOPANCREATOGRAPHY (ERCP);  Surgeon: [name], MD,FACG;  Location: [facility] END[facility];  Service: Endoscopy;  Laterality: N/A;  . ERCP N/A 09/17/2013   Procedure: ENDOSCOPIC RETROGRADE CHOLANGIOPANCREATOGRAPHY (ERCP);  Surgeon: [name], MD;  Location: [facility];  Service: Endoscopy;  Laterality: N/A;  . ESOPHAGOGASTRODUODENOSCOPY  09/2016   WNL. esophagus dilated [facility] Plan)  . HEMORRHOID BANDING  09-23-13   --Dr. Greer Pickerel  . HERNIA REPAIR     inguinal  . HYSTEROSCOPY W/ ENDOMETRIAL ABLATION    . 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 She appears well-developed and well-nourished.  HENT:  Head: Normocephalic and atraumatic.  Eyes: Conjunctivae and EOM are normal.  Neck: Normal range of motion.  Cardiovascular: Normal rate, regular rhythm, normal heart sounds and intact distal pulses.   Pulmonary/Chest: Effort normal and breath sounds normal.  Abdominal: Soft. Bowel sounds are normal.  Lymphadenopathy:    She has no cervical adenopathy.  Neurological: She is alert and oriented to person, place, and time.  Skin: Skin is warm and dry. Capillary refill takes less than 2 seconds.  Psychiatric: She has a normal mood and affect. Her behavior is normal.  Nursing note and vitals reviewed.    Musculoskeletal Exam: Limited range of motion of her C-spine and lumbar spine she has thoracolumbar scoliosis. Shoulder joints elbow joints wrist joints with good range of motion. She has some DIP PIP thickening in her hands consistent with osteoarthritis. No synovitis was noted. She has tenderness on palpation over bilateral trochanteric bursa. Hip joints were full range of motion without discomfort. She is crepitus in her knee joints with discomfort without any warmth swelling or effusion. Although joints afford range of motion with no synovitis. Fibromyalgia tender points were 16 out of 18 positive.  CDAI Exam: No CDAI exam completed.    Investigation: No additional findings. CBC Latest Ref Rng & Units 09/13/2016 06/14/2016 07/16/2015  WBC 4.0 - 10.5 K/uL 9.4 12.1(H) 7.9  Hemoglobin 12.0 - 15.0 g/dL 13.4 12.6 12.0  Hematocrit 36.0 - 46.0 % 40.9 38.4 37.4    Platelets 150.0 - 400.0 K/uL 298.0 323.0 275.0    CMP Latest Ref Rng & Units 03/21/2017 09/13/2016 06/14/2016  Glucose 70 - 99 mg/dL - 112(H) 144(H)  BUN 6 - 23 mg/dL - 10 8  Creatinine 0.40 - 1.20 mg/dL - 0.62 0.72  Sodium 135 - 145 mEq/L - 140 140  Potassium 3.5 - 5.1 mEq/L - 4.8 5.0  Chloride 96 - 112 mEq/L - 104 105  CO2 19 - 32 mEq/L - 30 27  Calcium 8.4 - 10.5 mg/dL - 9.6 9.9  Total Protein 6.0 - 8.3 g/dL 6.8 7.2 -  Total Bilirubin 0.2 - 1.2 mg/dL 0.3 0.2 -  Alkaline Phos 39 - 117 U/L 81 89 -  AST 0 - 37 U/L 29 16 -  ALT 0 - 35 U/L 31 12 -    Imaging: Mr Lumbar Spine Wo Contrast  Result Date: 04/06/2017 CLINICAL DATA:  61 year old female with chronic lumbar back and right flank pain. EXAM: MRI LUMBAR SPINE WITHOUT CONTRAST TECHNIQUE: Multiplanar, multisequence MR imaging of the lumbar spine was performed. No intravenous contrast was administered. COMPARISON:  Lumbar radiographs 11/27/2015, and outside lumbar MRI 10/20/2015 available on [facility] PACS. FINDINGS: Segmentation: Normal lumbar segmentation as demonstrated on the comparison radiographs. Alignment: Moderate dextroconvex lumbar scoliosis. Associated reversal of lumbar lordosis. Mild retrolisthesis of L5 on S1. Stable vertebral height and alignment since 2017. Vertebrae: Mild degenerative appearing vertebral body and endplate marrow edema from the inferior L2 to the superior L5 endplates. Superimposed chronic degenerative endplate marrow signal changes. Background bone marrow signal is within normal limits. Intact visible sacrum and SI joints. Conus medullaris: Extends to the T12-L1 level and appears normal. Paraspinal and other soft tissues: Negative. Disc levels: T10-T11: Partially visible right paracentral or parasagittal disc extrusion (series 5, image 11). No definite spinal stenosis. T11-T12: Minimal central disc bulge or protrusion is stable. Mild facet hypertrophy. T12-L1:  Negative. L1-L2: Disc space loss with stable  circumferential disc bulge. Endplate spurring. Mild facet hypertrophy greater on the left. No significant stenosis. L2-L3: Disc space loss with circumferential disc bulge. Left far lateral dominant disc osteophyte complex. Broad-based posterior component. Mild to moderate facet hypertrophy. Stable mild left lateral recess stenosis, and left L2 foraminal stenosis. L3-L4: Chronic disc space loss with left eccentric circumferential disc bulge and endplate spurring. Broad-based posterior component with moderate to severe facet and ligament flavum hypertrophy. Moderate spinal stenosis and moderate to severe bilateral lateral recess stenosis greater on the left. Moderate to severe left L3 neural foraminal stenosis. This level is stable. L4-L5: Severe disc space loss with circumferential but mostly right far lateral disc osteophyte complex. Moderate to severe right facet and ligament flavum hypertrophy, more mild to moderate on the left. No significant spinal stenosis, but moderate to severe right lateral recess stenosis and right L4 neural foraminal stenosis. This level is stable. L5-S1: Severe disc space loss. Circumferential but mostly far lateral and right greater than left disc osteophyte complex. Broad-based posterior component. Mild to moderate facet hypertrophy greater on the right. No spinal or lateral recess stenosis. Moderate to severe right L5 foraminal stenosis. This level is stable. IMPRESSION: 1. Stable MRI appearance of the lumbar spine since 2017: Dextroconvex lumbar scoliosis with severe chronic disc and endplate degeneration throughout the lumbar spine, and superimposed posterior element hypertrophy. - L3-L4 multifactorial moderate spinal stenosis, moderate to severe left greater than right lateral recess stenosis, and moderate to severe left foraminal stenosis. - L4-L5 moderate to severe right lateral recess and right foraminal stenosis. - L5-S1 moderate to severe right foraminal stenosis. 2. Partially  visible lower thoracic rightward disc herniation at T10-T11. Electronically Signed   By: Genevie Ann M.D.   On: 04/06/2017 17:29   Xr Knee 3 View Left  Result Date: 04/10/2017 Moderate medial compartment narrowing and moderate patellofemoral narrowing. No chondrocalcinosis noted. Impression: These findings are consistent with moderate osteoarthritis and moderate chondromalacia patella.  Xr Knee 3 View Right  Result Date: 04/10/2017 Moderate medial compartment narrowing and moderate patellofemoral narrowing. No chondrocalcinosis noted. Impression: These findings are consistent with moderate osteoarthritis and moderate chondromalacia patella.   Speciality Comments: No specialty comments available.    Procedures:  No procedures performed Allergies: Codeine phosphate; Cymbalta [duloxetine hcl]; Celebrex [celecoxib]; Inh [isoniazid]; Lithium; Nucynta [tapentadol]; Silenor [doxepin hcl];  Meperidine hcl; and Sulfamethoxazole   Assessment / Plan:     Visit Diagnoses: Fibromyalgia - she continues to have ongoing discomfort pain due to fibromyalgia. She is on On Lyrica 150 mg po tid and tramadol. She states despite the medication she is not having adequate control of her pain. I will refer her for physical therapy.  Primary osteoarthritis of both knees -she complains of increased pain and discomfort in her knee joints. Plan: XR KNEE 3 VIEW RIGHT, XR KNEE 3 VIEW LEFT. X-rays revealed moderate osteoarthritis and moderate chondromalacia patella.  Trochanteric bursitis: She has bilateral trochanteric bursitis with severe discomfort. She denied inadequate response to the cortisone injection. I'll refer her to physical therapy.  DDD (degenerative disc disease), cervical s/p Cervical fusion : Chronic pain  DDD (degenerative disc disease), lumbar: Chronic pain  History of scoliosis  [name] disease without gangrene -ANA . All autoimmune workup has been negative. She has no synovitis on exam.  Positive  QuantiFERON-TB Gold test - [facility] 2013  Her other medical problems are listed as follows:  History of porphyria  History of chronic pain  CFS (chronic fatigue syndrome)  History of cystitis ([place])  History of bipolar disorder  History of gastroesophageal reflux (GERD)  Primary insomnia    Orders: Orders Placed This Encounter  Procedures  . XR KNEE 3 VIEW RIGHT  . XR KNEE 3 VIEW LEFT  . Ambulatory referral to Physical Therapy   No orders of the defined types were placed in this encounter.   Face-to-face time spent with patient was 30 minutes. Greater than 50% of time was spent in counseling and coordination of care.  Follow-Up Instructions: Return in about 6 months (around 10/08/2017) for OA, DDD, FMS.   Bo Merino, MD  Note - This record has been created using Editor, commissioning.  Chart creation errors have been sought, but may not always  have been located. Such creation errors do not reflect on  the standard of medical care.

## 2017-04-06 ENCOUNTER — Ambulatory Visit
Admission: RE | Admit: 2017-04-06 | Discharge: 2017-04-06 | Disposition: A | Discharge status: Home / Self Care | Payer: Medicare Other | Source: Ambulatory Visit | Attending: Neurosurgery | Admitting: Neurosurgery

## 2017-04-06 DIAGNOSIS — G8929 Other chronic pain: Secondary | ICD-10-CM

## 2017-04-06 DIAGNOSIS — M545 Low back pain: Principal | ICD-10-CM

## 2017-04-08 ENCOUNTER — Encounter: Payer: Self-pay | Admitting: Family Medicine

## 2017-04-08 DIAGNOSIS — I7300 Raynaud's syndrome without gangrene: Secondary | ICD-10-CM

## 2017-04-08 DIAGNOSIS — M5136 Other intervertebral disc degeneration, lumbar region: Secondary | ICD-10-CM

## 2017-04-08 DIAGNOSIS — M5030 Other cervical disc degeneration, unspecified cervical region: Secondary | ICD-10-CM

## 2017-04-08 DIAGNOSIS — M659 Synovitis and tenosynovitis, unspecified: Secondary | ICD-10-CM

## 2017-04-10 ENCOUNTER — Ambulatory Visit (INDEPENDENT_AMBULATORY_CARE_PROVIDER_SITE_OTHER): Payer: Self-pay

## 2017-04-10 ENCOUNTER — Ambulatory Visit (INDEPENDENT_AMBULATORY_CARE_PROVIDER_SITE_OTHER): Payer: Medicare Other

## 2017-04-10 ENCOUNTER — Ambulatory Visit (INDEPENDENT_AMBULATORY_CARE_PROVIDER_SITE_OTHER): Payer: Medicare Other | Admitting: Rheumatology

## 2017-04-10 ENCOUNTER — Encounter: Payer: Self-pay | Admitting: Rheumatology

## 2017-04-10 VITALS — BP 124/72 | HR 78 | Resp 14 | Ht 64.0 in | Wt 137.0 lb

## 2017-04-10 DIAGNOSIS — G9332 Myalgic encephalomyelitis/chronic fatigue syndrome: Secondary | ICD-10-CM

## 2017-04-10 DIAGNOSIS — I7300 Raynaud's syndrome without gangrene: Secondary | ICD-10-CM

## 2017-04-10 DIAGNOSIS — R7612 Nonspecific reaction to cell mediated immunity measurement of gamma interferon antigen response without active tuberculosis: Secondary | ICD-10-CM

## 2017-04-10 DIAGNOSIS — Z8739 Personal history of other diseases of the musculoskeletal system and connective tissue: Secondary | ICD-10-CM

## 2017-04-10 DIAGNOSIS — R5382 Chronic fatigue, unspecified: Secondary | ICD-10-CM

## 2017-04-10 DIAGNOSIS — M5136 Other intervertebral disc degeneration, lumbar region: Secondary | ICD-10-CM | POA: Diagnosis not present

## 2017-04-10 DIAGNOSIS — M170 Bilateral primary osteoarthritis of knee: Secondary | ICD-10-CM

## 2017-04-10 DIAGNOSIS — Z8659 Personal history of other mental and behavioral disorders: Secondary | ICD-10-CM | POA: Diagnosis not present

## 2017-04-10 DIAGNOSIS — Z8639 Personal history of other endocrine, nutritional and metabolic disease: Secondary | ICD-10-CM

## 2017-04-10 DIAGNOSIS — M5030 Other cervical disc degeneration, unspecified cervical region: Secondary | ICD-10-CM | POA: Diagnosis not present

## 2017-04-10 DIAGNOSIS — Z87898 Personal history of other specified conditions: Secondary | ICD-10-CM

## 2017-04-10 DIAGNOSIS — F5101 Primary insomnia: Secondary | ICD-10-CM

## 2017-04-10 DIAGNOSIS — M7062 Trochanteric bursitis, left hip: Secondary | ICD-10-CM

## 2017-04-10 DIAGNOSIS — Z87440 Personal history of urinary (tract) infections: Secondary | ICD-10-CM

## 2017-04-10 DIAGNOSIS — M7061 Trochanteric bursitis, right hip: Secondary | ICD-10-CM

## 2017-04-10 DIAGNOSIS — M797 Fibromyalgia: Secondary | ICD-10-CM

## 2017-04-10 DIAGNOSIS — Z8719 Personal history of other diseases of the digestive system: Secondary | ICD-10-CM

## 2017-04-10 DIAGNOSIS — M51369 Other intervertebral disc degeneration, lumbar region without mention of lumbar back pain or lower extremity pain: Secondary | ICD-10-CM

## 2017-04-10 NOTE — Telephone Encounter (Signed)
See pt email - pt requests referral to Dr Amil Amen for 2nd opinion - sounds like she would like to wait on referral until she has records from Dr Estanislado Pandy.

## 2017-04-10 NOTE — Patient Instructions (Signed)
Knee Exercises Ask your health care provider which exercises are safe for you. Do exercises exactly as told by your health care provider and adjust them as directed. It is normal to feel mild stretching, pulling, tightness, or discomfort as you do these exercises, but you should stop right away if you feel sudden pain or your pain gets worse.Do not begin these exercises until told by your health care provider. STRETCHING AND RANGE OF MOTION EXERCISES These exercises warm up your muscles and joints and improve the movement and flexibility of your knee. These exercises also help to relieve pain, numbness, and tingling. Exercise A: Knee Extension, Prone 1. Lie on your abdomen on a bed. 2. Place your left / right knee just beyond the edge of the surface so your knee is not on the bed. You can put a towel under your left / right thigh just above your knee for comfort. 3. Relax your leg muscles and allow gravity to straighten your knee. You should feel a stretch behind your left / right knee. 4. Hold this position for __________ seconds. 5. Scoot up so your knee is supported between repetitions. Repeat __________ times. Complete this stretch __________ times a day. Exercise B: Knee Flexion, Active  1. Lie on your back with both knees straight. If this causes back discomfort, bend your left / right knee so your foot is flat on the floor. 2. Slowly slide your left / right heel back toward your buttocks until you feel a gentle stretch in the front of your knee or thigh. 3. Hold this position for __________ seconds. 4. Slowly slide your left / right heel back to the starting position. Repeat __________ times. Complete this exercise __________ times a day. Exercise C: Quadriceps, Prone  1. Lie on your abdomen on a firm surface, such as a bed or padded floor. 2. Bend your left / right knee and hold your ankle. If you cannot reach your ankle or pant leg, loop a belt around your foot and grab the belt  instead. 3. Gently pull your heel toward your buttocks. Your knee should not slide out to the side. You should feel a stretch in the front of your thigh and knee. 4. Hold this position for __________ seconds. Repeat __________ times. Complete this stretch __________ times a day. Exercise D: Hamstring, Supine 1. Lie on your back. 2. Loop a belt or towel over the ball of your left / right foot. The ball of your foot is on the walking surface, right under your toes. 3. Straighten your left / right knee and slowly pull on the belt to raise your leg until you feel a gentle stretch behind your knee. ? Do not let your left / right knee bend while you do this. ? Keep your other leg flat on the floor. 4. Hold this position for __________ seconds. Repeat __________ times. Complete this stretch __________ times a day. STRENGTHENING EXERCISES These exercises build strength and endurance in your knee. Endurance is the ability to use your muscles for a long time, even after they get tired. Exercise E: Quadriceps, Isometric  1. Lie on your back with your left / right leg extended and your other knee bent. Put a rolled towel or small pillow under your knee if told by your health care provider. 2. Slowly tense the muscles in the front of your left / right thigh. You should see your kneecap slide up toward your hip or see increased dimpling just above the knee. This   motion will push the back of the knee toward the floor. 3. For __________ seconds, keep the muscle as tight as you can without increasing your pain. 4. Relax the muscles slowly and completely. Repeat __________ times. Complete this exercise __________ times a day. Exercise F: Straight Leg Raises - Quadriceps 1. Lie on your back with your left / right leg extended and your other knee bent. 2. Tense the muscles in the front of your left / right thigh. You should see your kneecap slide up or see increased dimpling just above the knee. Your thigh may  even shake a bit. 3. Keep these muscles tight as you raise your leg 4-6 inches (10-15 cm) off the floor. Do not let your knee bend. 4. Hold this position for __________ seconds. 5. Keep these muscles tense as you lower your leg. 6. Relax your muscles slowly and completely after each repetition. Repeat __________ times. Complete this exercise __________ times a day. Exercise G: Hamstring, Isometric 1. Lie on your back on a firm surface. 2. Bend your left / right knee approximately __________ degrees. 3. Dig your left / right heel into the surface as if you are trying to pull it toward your buttocks. Tighten the muscles in the back of your thighs to dig as hard as you can without increasing any pain. 4. Hold this position for __________ seconds. 5. Release the tension gradually and allow your muscles to relax completely for __________ seconds after each repetition. Repeat __________ times. Complete this exercise __________ times a day. Exercise H: Hamstring Curls  If told by your health care provider, do this exercise while wearing ankle weights. Begin with __________ weights. Then increase the weight by 1 lb (0.5 kg) increments. Do not wear ankle weights that are more than __________. 1. Lie on your abdomen with your legs straight. 2. Bend your left / right knee as far as you can without feeling pain. Keep your hips flat against the floor. 3. Hold this position for __________ seconds. 4. Slowly lower your leg to the starting position.  Repeat __________ times. Complete this exercise __________ times a day. Exercise I: Squats (Quadriceps) 1. Stand in front of a table, with your feet and knees pointing straight ahead. You may rest your hands on the table for balance but not for support. 2. Slowly bend your knees and lower your hips like you are going to sit in a chair. ? Keep your weight over your heels, not over your toes. ? Keep your lower legs upright so they are parallel with the table  legs. ? Do not let your hips go lower than your knees. ? Do not bend lower than told by your health care provider. ? If your knee pain increases, do not bend as low. 3. Hold the squat position for __________ seconds. 4. Slowly push with your legs to return to standing. Do not use your hands to pull yourself to standing. Repeat __________ times. Complete this exercise __________ times a day. Exercise J: Wall Slides (Quadriceps)  1. Lean your back against a smooth wall or door while you walk your feet out 18-24 inches (46-61 cm) from it. 2. Place your feet hip-width apart. 3. Slowly slide down the wall or door until your knees bend __________ degrees. Keep your knees over your heels, not over your toes. Keep your knees in line with your hips. 4. Hold for __________ seconds. Repeat __________ times. Complete this exercise __________ times a day. Exercise K: Straight Leg Raises -   Hip Abductors 1. Lie on your side with your left / right leg in the top position. Lie so your head, shoulder, knee, and hip line up. You may bend your bottom knee to help you keep your balance. 2. Roll your hips slightly forward so your hips are stacked directly over each other and your left / right knee is facing forward. 3. Leading with your heel, lift your top leg 4-6 inches (10-15 cm). You should feel the muscles in your outer hip lifting. ? Do not let your foot drift forward. ? Do not let your knee roll toward the ceiling. 4. Hold this position for __________ seconds. 5. Slowly return your leg to the starting position. 6. Let your muscles relax completely after each repetition. Repeat __________ times. Complete this exercise __________ times a day. Exercise L: Straight Leg Raises - Hip Extensors 1. Lie on your abdomen on a firm surface. You can put a pillow under your hips if that is more comfortable. 2. Tense the muscles in your buttocks and lift your left / right leg about 4-6 inches (10-15 cm). Keep your knee  straight as you lift your leg. 3. Hold this position for __________ seconds. 4. Slowly lower your leg to the starting position. 5. Let your leg relax completely after each repetition. Repeat __________ times. Complete this exercise __________ times a day. This information is not intended to replace advice given to you by your health care provider. Make sure you discuss any questions you have with your health care provider. Document Released: 05/25/2005 Document Revised: 04/04/2016 Document Reviewed: 05/17/2015 [employer] Interactive Patient Education  2018 [employer] Iliotibial Bursitis Rehab Ask your health care provider which exercises are safe for you. Do exercises exactly as told by your health care provider and adjust them as directed. It is normal to feel mild stretching, pulling, tightness, or discomfort as you do these exercises, but you should stop right away if you feel sudden pain or your pain gets worse.Do not begin these exercises until told by your health care provider. Stretching and range of motion exercises These exercises warm up your muscles and joints and improve the movement and flexibility of your leg. These exercises also help to relieve pain and stiffness. Exercise A: Quadriceps stretch, prone  1. Lie on your abdomen on a firm surface, such as a bed or padded floor. 2. Bend your left / right knee and hold your ankle. If you cannot reach your ankle or pant leg, loop a belt around your foot and grab the belt instead. 3. Gently pull your heel toward your buttocks. Your knee should not slide out to the side. You should feel a stretch in the front of your thigh and knee. 4. Hold this position for __________ seconds. Repeat __________ times. Complete this exercise __________ times a day. Exercise B: Lunge ( adductor stretch) 1. Stand and spread your legs about 3 feet (about 1 m) apart. Put your left / right leg slightly back for balance. 2. Lean away from your left / right  leg by bending your other knee and shifting your weight toward your bent knee. You may rest your hands on your thigh for balance. You should feel a stretch in your left / right inner thigh. 3. Hold for __________ seconds. Repeat __________ times. Complete this exercise __________ times a day. Exercise C: Hamstring stretch, supine  1. Lie on your back. 2. Hold both ends of a belt or towel as you loop it over the ball of   your left / right foot. The ball of your foot is on the walking surface, right under your toes. 3. Straighten your left / right knee and slowly pull on the belt to raise your leg. Stop when you feel a gentle stretch in the back of your left / right knee or thigh. ? Do not let your left / right knee bend. ? Keep your other leg flat on the floor. 4. Hold this position for __________ seconds. Repeat __________ times. Complete this exercise __________ times a day. Strengthening exercises These exercises build strength and endurance in your leg. Endurance is the ability to use your muscles for a long time, even after they get tired. Exercise D: Quadriceps wall slides  1. Lean your back against a smooth wall or door while you walk your feet out 18-24 inches (46-61 cm) from it. 2. Place your feet hip-width apart. 3. Slowly slide down the wall or door until your knees bend as far as told by your health care provider. Keep your knees over your heels, not your toes. Keep your knees in line with your hips. 4. Hold for __________ seconds. 5. Push through your heels to stand up to rest for __________ seconds after each repetition. Repeat __________ times. Complete this exercise __________ times a day. Exercise E: Straight leg raises ( hip abductors) 1. Lie on your side, with your left / right leg in the top position. Lie so your head, shoulder, knee, and hip line up with each other. You may bend your bottom knee to help you balance. 2. Lift your top leg 4-6 inches (10-15 cm) while keeping  your toes pointed straight ahead. 3. Hold this position for __________ seconds. 4. Slowly lower your leg to the starting position. Allow your muscles to relax completely after each repetition. Repeat __________ times. Complete this exercise __________ times a day. Exercise F: Straight leg raises ( hip extensors) 1. Lie on your abdomen on a firm surface. You can put a pillow under your hips if that is more comfortable. 2. Tense the muscles in your buttocks and lift your left / right leg about 4-6 inches (10-15 cm). Keep your knee straight as you lift your leg. 3. Hold this position for __________ seconds. 4. Slowly lower your leg to the starting position. 5. Let your leg relax completely after each repetition. Repeat __________ times. Complete this exercise __________ times a day. Exercise G: Bridge ( hip extensors) 1. Lie on your back on a firm surface with your knees bent and your feet flat on the floor. 2. Tighten your buttocks muscles and lift your bottom off the floor until your trunk is level with your thighs. ? Do not arch your back. ? You should feel the muscles working in your buttocks and the back of your thighs. If you do not feel these muscles, slide your feet 1-2 inches (2.5-5 cm) farther away from your buttocks. 3. Hold this position for __________ seconds. 4. Slowly lower your hips to the starting position. 5. Let your buttocks muscles relax completely between repetitions. 6. If this exercise is too easy, try doing it with your arms crossed over your chest. Repeat __________ times. Complete this exercise __________ times a day. This information is not intended to replace advice given to you by your health care provider. Make sure you discuss any questions you have with your health care provider. Document Released: 07/11/2005 Document Revised: 03/17/2016 Document Reviewed: 06/23/2015 [employer] Interactive Patient Education  2018 [employer]  

## 2017-04-13 ENCOUNTER — Encounter: Payer: Self-pay | Admitting: Family Medicine

## 2017-04-14 MED ORDER — LINACLOTIDE 290 MCG PO CAPS: 290.0000 ug | ORAL_CAPSULE | Freq: Every day | ORAL | 3 refills | Status: DC

## 2017-04-17 ENCOUNTER — Other Ambulatory Visit: Payer: Self-pay | Admitting: Gastroenterology

## 2017-04-17 ENCOUNTER — Other Ambulatory Visit: Payer: Self-pay | Admitting: Family Medicine

## 2017-04-17 NOTE — Telephone Encounter (Signed)
Last filled:  01/11/17, #90 Last OV:  03/21/17 Next OV:  06/23/17

## 2017-04-17 NOTE — Telephone Encounter (Signed)
plz phone in to [facility].

## 2017-04-18 NOTE — Telephone Encounter (Signed)
See yesterday's note. plz phone in

## 2017-04-18 NOTE — Telephone Encounter (Signed)
Verbal refill given to Rachel Duran at the pharmacy

## 2017-04-30 ENCOUNTER — Other Ambulatory Visit: Payer: Self-pay | Admitting: Family Medicine

## 2017-05-16 ENCOUNTER — Ambulatory Visit (INDEPENDENT_AMBULATORY_CARE_PROVIDER_SITE_OTHER): Payer: Medicare Other

## 2017-05-16 DIAGNOSIS — Z23 Encounter for immunization: Secondary | ICD-10-CM

## 2017-06-05 ENCOUNTER — Encounter: Payer: Self-pay | Admitting: Family Medicine

## 2017-06-05 ENCOUNTER — Other Ambulatory Visit: Payer: Self-pay

## 2017-06-05 NOTE — Telephone Encounter (Signed)
Adderall last printed:  03/13/17, #90 Embeda last printed:  03/01/17, #90 Last OV:  03/21/17 Next OV:  06/23/17

## 2017-06-07 MED ORDER — MORPHINE-NALTREXONE 80-3.2 MG PO CPCR: 1.0000 | ORAL_CAPSULE | Freq: Every day | ORAL | 0 refills | Status: DC

## 2017-06-07 MED ORDER — AMPHETAMINE-DEXTROAMPHET ER 30 MG PO CP24: 30.0000 mg | ORAL_CAPSULE | ORAL | 0 refills | Status: DC

## 2017-06-07 NOTE — Telephone Encounter (Signed)
Left message on vm per dpr notifying her rxs are ready to pick up. [Placed rxs at front office.]

## 2017-06-07 NOTE — Telephone Encounter (Signed)
Printed and in Lisa's box.  

## 2017-06-20 ENCOUNTER — Other Ambulatory Visit: Payer: Self-pay | Admitting: Family Medicine

## 2017-06-20 DIAGNOSIS — E611 Iron deficiency: Secondary | ICD-10-CM

## 2017-06-20 DIAGNOSIS — D638 Anemia in other chronic diseases classified elsewhere: Secondary | ICD-10-CM

## 2017-06-20 DIAGNOSIS — Z13220 Encounter for screening for lipoid disorders: Secondary | ICD-10-CM

## 2017-06-20 DIAGNOSIS — R7303 Prediabetes: Secondary | ICD-10-CM

## 2017-06-20 DIAGNOSIS — R946 Abnormal results of thyroid function studies: Secondary | ICD-10-CM

## 2017-06-21 ENCOUNTER — Ambulatory Visit (INDEPENDENT_AMBULATORY_CARE_PROVIDER_SITE_OTHER): Payer: Medicare Other

## 2017-06-21 VITALS — BP 112/78 | HR 86 | Temp 98.5°F | Ht 63.75 in | Wt 132.2 lb

## 2017-06-21 DIAGNOSIS — Z13220 Encounter for screening for lipoid disorders: Secondary | ICD-10-CM

## 2017-06-21 DIAGNOSIS — Z0000 Encounter for general adult medical examination without abnormal findings: Secondary | ICD-10-CM | POA: Diagnosis not present

## 2017-06-21 DIAGNOSIS — R7303 Prediabetes: Secondary | ICD-10-CM | POA: Diagnosis not present

## 2017-06-21 DIAGNOSIS — R946 Abnormal results of thyroid function studies: Secondary | ICD-10-CM

## 2017-06-21 DIAGNOSIS — E611 Iron deficiency: Secondary | ICD-10-CM | POA: Diagnosis not present

## 2017-06-21 LAB — FERRITIN: FERRITIN: 61.2 ng/mL (ref 10.0–291.0)

## 2017-06-21 LAB — BASIC METABOLIC PANEL
BUN: 11 mg/dL (ref 6–23)
CO2: 31 meq/L (ref 19–32)
Calcium: 9.5 mg/dL (ref 8.4–10.5)
Chloride: 103 mEq/L (ref 96–112)
Creatinine, Ser: 0.59 mg/dL (ref 0.40–1.20)
GFR: 110.73 mL/min (ref >60.00)
GLUCOSE: 117 mg/dL — AB (ref 70–99)
POTASSIUM: 4.4 meq/L (ref 3.5–5.1)
SODIUM: 139 meq/L (ref 135–145)

## 2017-06-21 LAB — VITAMIN B12: Vitamin B-12: 908 pg/mL (ref 211–911)

## 2017-06-21 LAB — T4, FREE: Free T4: 0.82 ng/dL (ref 0.60–1.60)

## 2017-06-21 LAB — IBC PANEL
Iron: 88 ug/dL (ref 42–145)
SATURATION RATIOS: 22.3 % (ref 20.0–50.0)
TRANSFERRIN: 282 mg/dL (ref 212.0–360.0)

## 2017-06-21 LAB — LIPID PANEL
CHOL/HDL RATIO: 4
Cholesterol: 208 mg/dL — ABNORMAL HIGH (ref 0–200)
HDL: 55.8 mg/dL (ref >39.00)
LDL CALC: 122 mg/dL — AB (ref 0–99)
NONHDL: 152.41
TRIGLYCERIDES: 151 mg/dL — AB (ref 0.0–149.0)
VLDL: 30.2 mg/dL (ref 0.0–40.0)

## 2017-06-21 LAB — HEMOGLOBIN A1C: Hgb A1c MFr Bld: 5.9 % (ref 4.6–6.5)

## 2017-06-21 LAB — TSH: TSH: 1.78 u[IU]/mL (ref 0.35–4.50)

## 2017-06-21 NOTE — Progress Notes (Signed)
Pre visit review using our clinic review tool, if applicable. No additional management support is needed unless otherwise documented below in the visit note. 

## 2017-06-21 NOTE — Patient Instructions (Signed)
Rachel Duran , Thank you for taking time to come for your Medicare Wellness Visit. I appreciate your ongoing commitment to your health goals. Please review the following plan we discussed and let me know if I can assist you in the future.   These are the goals we discussed: Goals    . Increase physical activity     Starting 06/21/17, I will continue to exercise 45-60 minutes 6 days per week.        This is a list of the screening recommended for you and due dates:  Health Maintenance  Topic Date Due  . Colon Cancer Screening  06/21/2018*  . HIV Screening  06/21/2024*  . DTaP/Tdap/Td vaccine (1 - Tdap) 11/11/2026*  . Mammogram  10/26/2018  . Pap Smear  01/14/2020  . Tetanus Vaccine  11/11/2026  . Flu Shot  Completed  .  Hepatitis C: One time screening is recommended by Center for Disease Control  (CDC) for  adults born from [birth year] through 1963.   Completed  *Topic was postponed. The date shown is not the original due date.   Preventive Care for Adults  A healthy lifestyle and preventive care can promote health and wellness. Preventive health guidelines for adults include the following key practices.  . A routine yearly physical is a good way to check with your health care provider about your health and preventive screening. It is a chance to share any concerns and updates on your health and to receive a thorough exam.  . Visit your dentist for a routine exam and preventive care every 6 months. Brush your teeth twice a day and floss once a day. Good oral hygiene prevents tooth decay and gum disease.  . The frequency of eye exams is based on your age, health, family medical history, use  of contact lenses, and other factors. Follow your health care provider's recommendations for frequency of eye exams.  . Eat a healthy diet. Foods like vegetables, fruits, whole grains, low-fat dairy products, and lean protein foods contain the nutrients you need without too many calories. Decrease your  intake of foods high in solid fats, added sugars, and salt. Eat the right amount of calories for you. Get information about a proper diet from your health care provider, if necessary.  . Regular physical exercise is one of the most important things you can do for your health. Most adults should get at least 150 minutes of moderate-intensity exercise (any activity that increases your heart rate and causes you to sweat) each week. In addition, most adults need muscle-strengthening exercises on 2 or more days a week.  Silver Sneakers may be a benefit available to you. To determine eligibility, you may visit the website: www.silversneakers.com or contact program at 202-552-4793 Mon-Fri between 8AM-8PM.   . Maintain a healthy weight. The body mass index (BMI) is a screening tool to identify possible weight problems. It provides an estimate of body fat based on height and weight. Your health care provider can find your BMI and can help you achieve or maintain a healthy weight.   For adults 20 years and older: ? A BMI below 18.5 is considered underweight. ? A BMI of 18.5 to 24.9 is normal. ? A BMI of 25 to 29.9 is considered overweight. ? A BMI of 30 and above is considered obese.   . Maintain normal blood lipids and cholesterol levels by exercising and minimizing your intake of saturated fat. Eat a balanced diet with plenty of fruit and vegetables.  Blood tests for lipids and cholesterol should begin at age 33 and be repeated every 5 years. If your lipid or cholesterol levels are high, you are over 50, or you are at high risk for heart disease, you may need your cholesterol levels checked more frequently. Ongoing high lipid and cholesterol levels should be treated with medicines if diet and exercise are not working.  . If you smoke, find out from your health care provider how to quit. If you do not use tobacco, please do not start.  . If you choose to drink alcohol, please do not consume more than 2  drinks per day. One drink is considered to be 12 ounces (355 mL) of beer, 5 ounces (148 mL) of wine, or 1.5 ounces (44 mL) of liquor.  . If you are 67-50 years old, ask your health care provider if you should take aspirin to prevent strokes.  . Use sunscreen. Apply sunscreen liberally and repeatedly throughout the day. You should seek shade when your shadow is shorter than you. Protect yourself by wearing long sleeves, pants, a wide-brimmed hat, and sunglasses year round, whenever you are outdoors.  . Once a month, do a whole body skin exam, using a mirror to look at the skin on your back. Tell your health care provider of new moles, moles that have irregular borders, moles that are larger than a pencil eraser, or moles that have changed in shape or color.

## 2017-06-21 NOTE — Progress Notes (Signed)
PCP notes:   Health maintenance:  Colonoscopy - pt declined at this time HIV screening - pt declined  Abnormal screenings:   Depression score:  9  Patient concerns:   None  Nurse concerns:  None  Next PCP appt:   06/23/17 @ 1130

## 2017-06-21 NOTE — Progress Notes (Signed)
Subjective:   Rachel Duran is a 61 y.o. female who presents for Medicare Annual (Subsequent) preventive examination.  Review of Systems:  N/A Cardiac Risk Factors include: none     Objective:     Vitals: BP 112/78 (BP Location: Right Arm, Patient Position: Sitting, Cuff Size: Normal)   Pulse 86   Temp 98.5 F (36.9 C) (Oral)   Ht 5' 3.75" (1.619 m) Comment: no shoes  Wt 132 lb 4 oz (60 kg)   LMP 07/25/1998   PF 97 L/min   BMI 22.88 kg/m   Body mass index is 22.88 kg/m.   Tobacco Social History   Tobacco Use  Smoking Status Never Smoker  Smokeless Tobacco Never Used     Counseling given: No   Past Medical History:  Diagnosis Date  . Abdominal pain last 4 months   and nausea also  . Allergy   . Anemia   . Anxiety   . 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 C6- C7 -hx. past fusion(bone graft used)  . Cholecystitis   . Chronic pain   . DDD (degenerative disc disease), lumbar 09/2015   dextroscoliosis with multilevel DDD and facet arthrosis most notable for R foraminal disc protrusion L4/5 producing severe R neural foraminal stenosis abutting R L4 nerve root, moderate spinal canal and mild lat recesss and R neural foraminal stenosis L3/4 (MRI)  . Depression    bipolar depression  . Disorders of porphyrin metabolism   . Felon of finger of left hand 11/10/2016  . Fibromyalgia   . GERD (gastroesophageal reflux disease)   . Internal hemorrhoids   . Interstitial cystitis 06-06-12   hx.  . Irritable bowel syndrome   . PONV (postoperative nausea and vomiting)    now uses stomach blockers and no ponv  . 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 Raynauds disease    hx.  . Seronegative arthritis    [name]  . 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  Stargardt's disease    Past Surgical History:  Procedure Laterality Date  . ANTERIOR CERVICAL DECOMP/DISCECTOMY FUSION  2004   C5/6, C6/7  . ANTERIOR CERVICAL DECOMP/DISCECTOMY FUSION  02/2016   C3/4, C4/5 with plating [name])  . AUGMENTATION MAMMAPLASTY Bilateral 03/25/2010  . BREAST ENHANCEMENT SURGERY  2010  . BREAST IMPLANT EXCHANGE  10/2014   exchange saline implants, B mastopexy/capsulorraphy ([facility])  . BUNIONECTOMY Bilateral yrs ago  . [place]   x 1  . CHOLECYSTECTOMY  06/11/2012   Procedure: LAPAROSCOPIC CHOLECYSTECTOMY WITH INTRAOPERATIVE CHOLANGIOGRAM;  Surgeon: [name], MD,FACS;  Location: [facility];  Service: General;  Laterality: N/A;  . CYSTOSCOPY    . ERCP  05/22/2012   Procedure: ENDOSCOPIC RETROGRADE CHOLANGIOPANCREATOGRAPHY (ERCP);  Surgeon: [name], MD,FACG;  Location: [facility];  Service: Endoscopy;  Laterality: N/A;  . ERCP N/A 09/17/2013   Procedure: ENDOSCOPIC RETROGRADE CHOLANGIOPANCREATOGRAPHY (ERCP);  Surgeon: [name], MD;  Location: [facility];  Service: Endoscopy;  Laterality: N/A;  . ESOPHAGOGASTRODUODENOSCOPY  09/2016   WNL. esophagus dilated Fuller Plan)  . HEMORRHOID BANDING  09-23-13   --Dr. Greer Pickerel  . HERNIA REPAIR     inguinal  . HYSTEROSCOPY W/ ENDOMETRIAL ABLATION    . NASAL SINUS  SURGERY     x5   Family History  Problem Relation Age of Onset  . CAD Father 32       MI, nonsmoker  . Esophageal cancer Father 53  . Stomach cancer Father   . Scleroderma Mother   . Hypertension Mother   . Esophageal cancer Paternal Grandfather   . Stomach cancer Paternal Grandfather   . Diabetes Maternal Grandmother   . Arthritis Brother   . Stroke Neg Hx   . Colon cancer Neg Hx    Social History   Substance and Sexual Activity  Sexual Activity Yes  . Partners: Male  . Birth control/protection: Post-menopausal   Comment: vasectomy    Outpatient Encounter Medications as of 06/21/2017  Medication Sig    . acyclovir (ZOVIRAX) 800 MG tablet TAKE ONE-HALF TABLET BY  MOUTH EVERY DAY  . ALPRAZolam (XANAX) 1 MG tablet Take 1 tablet (1 mg total) by mouth 3 (three) times daily as needed for anxiety. With 1/2 tab at night time  . amLODipine (NORVASC) 5 MG tablet TAKE 1 TABLET BY MOUTH  DAILY  . amphetamine-dextroamphetamine (ADDERALL XR) 30 MG 24 hr capsule Take 1 capsule (30 mg total) every morning by mouth.  . ARIPiprazole (ABILIFY) 5 MG tablet Take 5 mg by mouth daily.   Marland Kitchen aspirin EC 81 MG tablet Take 81 mg by mouth every other day.   . Calcium-Magnesium-Vitamin D 740-81-448 MG-MG-UNIT TB24 Take 1 tablet by mouth 2 (two) times daily.  . ciclopirox (PENLAC) 8 % solution   . cyanocobalamin (,VITAMIN B-12,) 1000 MCG/ML injection INJECT INTRAMUSCULARLY 1ML  (1000MCG) EVERY 21 DAYS  (DISCARD 28 DAYS AFTER  FIRST USE.)  . diclofenac sodium (VOLTAREN) 1 % GEL Voltaren Gel 3 grams to 3 large joints upto TID 3 TUBES with 3 refills  . esomeprazole (NEXIUM) 40 MG capsule TAKE 1 CAPSULE BY MOUTH TWICE DAILY BEFORE MEALS  . Eszopiclone 3 MG TABS TAKE ONE TABLET AT BEDTIME  . ferrous sulfate 325 (65 FE) MG tablet Take 1 tablet (325 mg total) by mouth every Monday, Wednesday, and Friday.  . fluticasone (FLONASE) 50 MCG/ACT nasal spray 2 SPRAYS INTO BOTH NOSTRILS DAILY  . lamoTRIgine (LAMICTAL) 200 MG tablet Take 200 mg by mouth daily.  Marland Kitchen linaclotide (LINZESS) 290 MCG CAPS capsule Take 1 capsule (290 mcg total) by mouth daily.  . magic mouthwash w/lidocaine SOLN Take 5 mLs by mouth 3 (three) times daily as needed for mouth pain. Benadryl/viscous lidocaine 2%/maalox  . MAGNESIUM PO Take 1,000 mg by mouth 3 (three) times daily.  . Manganese 50 MG TABS Take 50 mg by mouth daily.   . Morphine-Naltrexone 80-3.2 MG CPCR Take 1 tablet daily by mouth.  . Multiple Vitamins-Minerals (ICAPS AREDS 2) CAPS Take 1 capsule by mouth daily.  . NONFORMULARY OR COMPOUNDED ITEM Testosterone propionate 2% in white petrolatum, apply  once daily.  . ondansetron (ZOFRAN) 4 MG tablet TAKE 1 TABLET BY MOUTH EVERY 8 HOURS AS NEEDED FOR NAUSEA OR VOMITING  . pentosan polysulfate (ELMIRON) 100 MG capsule Take 100 mg by mouth 3 (three) times daily.  . phenazopyridine (PYRIDIUM) 100 MG tablet Take 1 tablet (100 mg total) by mouth 3 (three) times daily as needed for pain.  . Polyethylene Glycol 3350 (MIRALAX PO) Take 17 g by mouth 4 (four) times daily.   . pregabalin (LYRICA) 150 MG capsule Take 1 capsule (150 mg total) by mouth 3 (three) times daily.  . traMADol (ULTRAM) 50 MG tablet Take 1 tablet (  50 mg total) by mouth 2 (two) times daily as needed.  . ursodiol (ACTIGALL) 300 MG capsule TAKE 1 CAPSULE TWICE DAILY  . YUVAFEM 10 MCG TABS vaginal tablet Place 1 tablet (10 mcg total) vaginally 3 (three) times a week.  . [DISCONTINUED] fluconazole (DIFLUCAN) 200 MG tablet    Facility-Administered Encounter Medications as of 06/21/2017  Medication  . 0.9 %  sodium chloride infusion    Activities of Daily Living In your present state of health, do you have any difficulty performing the following activities: 06/21/2017  Hearing? N  Vision? Y  Difficulty concentrating or making decisions? N  Walking or climbing stairs? Y  Comment knee and hip pain  Dressing or bathing? N  Doing errands, shopping? N  Preparing Food and eating ? N  Using the Toilet? N  In the past six months, have you accidently leaked urine? N  Do you have problems with loss of bowel control? N  Managing your Medications? N  Managing your Finances? N  Housekeeping or managing your Housekeeping? N  Some recent data might be hidden    Patient Care Team: Ria Bush, MD as PCP - General (Family Medicine) [name], MD as Consulting Physician (Gastroenterology) Jari Pigg, MD as Consulting Physician (Dermatology) Yisroel Ramming, Everardo All, MD as Consulting Physician (Obstetrics and Gynecology) Sherlynn Stalls, MD as Consulting Physician  (Ophthalmology) Luberta Mutter, MD as Consulting Physician (Ophthalmology) Amalia Hailey Marily Lente, MD as Referring Physician (Urology) Bo Merino, MD as Consulting Physician (Rheumatology) Newman Pies, MD as Consulting Physician (Neurosurgery)    Assessment:     Hearing Screening   125Hz  250Hz  500Hz  1000Hz  2000Hz  3000Hz  4000Hz  6000Hz  8000Hz   Right ear:   40 40 40  40    Left ear:   40 40 40  40    Vision Screening Comments: Last vision exam in April with Dr. Baird Cancer and in September with Dr. Ellie Lunch   Exercise Activities and Dietary recommendations Current Exercise Habits: Home exercise routine, Type of exercise: Other - see comments;yoga;walking;stretching(physical therapy exercises), Time (Minutes): 60, Frequency (Times/Week): 6, Weekly Exercise (Minutes/Week): 360, Intensity: Mild, Exercise limited by: None identified  Goals    . Increase physical activity     Starting 06/21/17, I will continue to exercise 45-60 minutes 6 days per week.       Fall Risk Fall Risk  06/21/2017 06/20/2016 06/15/2015 06/10/2014 04/25/2013  Falls in the past year? No No No No No   Depression Screen PHQ 2/9 Scores 06/21/2017 06/20/2016 06/15/2015 06/10/2014  PHQ - 2 Score 3 3 0 0  PHQ- 9 Score 9 16 - -     Cognitive Function MMSE - Mini Mental State Exam 06/21/2017  Orientation to time 5  Orientation to Place 5  Registration 3  Attention/ Calculation 0  Recall 3  Language- name 2 objects 0  Language- repeat 1  Language- follow 3 step command 3  Language- read & follow direction 0  Write a sentence 0  Copy design 0  Total score 20     PLEASE NOTE: A Mini-Cog screen was completed. Maximum score is 20. A value of 0 denotes this part of Folstein MMSE was not completed or the patient failed this part of the Mini-Cog screening.   Mini-Cog Screening Orientation to Time - Max 5 pts Orientation to Place - Max 5 pts Registration - Max 3 pts Recall - Max 3 pts Language Repeat - Max 1  pts Language Follow 3 Step Command - Max 3  pts     Immunization History  Administered Date(s) Administered  . Influenza Split 05/04/2011, 04/04/2012  . Influenza Whole 05/25/2007, 04/28/2008, 04/30/2009, 03/26/2010  . Influenza,inj,Quad PF,6+ Mos 04/16/2015, 04/15/2016, 05/16/2017  . Influenza-Unspecified 03/25/2014  . Pneumococcal Conjugate-13 08/02/2013  . Pneumococcal Polysaccharide-23 07/30/2009  . Td 11/01/2005, 11/10/2016   Screening Tests Health Maintenance  Topic Date Due  . COLONOSCOPY  06/21/2018 (Originally 11/19/2016)  . HIV Screening  06/21/2024 (Originally 01/22/1971)  . DTaP/Tdap/Td (1 - Tdap) 11/11/2026 (Originally 11/11/2016)  . MAMMOGRAM  10/26/2018  . PAP SMEAR  01/14/2020  . TETANUS/TDAP  11/11/2026  . INFLUENZA VACCINE  Completed  . Hepatitis C Screening  Completed      Plan:  I have personally reviewed, addressed, and noted the following in the patient's chart:  A. Medical and social history B. Use of alcohol, tobacco or illicit drugs  C. Current medications and supplements D. Functional ability and status E.  Nutritional status F.  Physical activity G. Advance directives H. List of other physicians I.  Hospitalizations, surgeries, and ER visits in previous 12 months J.  [place] to include hearing, vision, cognitive, depression L. Referrals and appointments - none  In addition, I have reviewed and discussed with patient certain preventive protocols, quality metrics, and best practice recommendations. A written personalized care plan for preventive services as well as general preventive health recommendations were provided to patient.  See attached scanned questionnaire for additional information.   Signed,   Lindell Noe, MHA, BS, LPN Health Coach

## 2017-06-23 ENCOUNTER — Telehealth: Payer: Self-pay

## 2017-06-23 ENCOUNTER — Encounter: Payer: Self-pay | Admitting: Family Medicine

## 2017-06-23 ENCOUNTER — Ambulatory Visit (INDEPENDENT_AMBULATORY_CARE_PROVIDER_SITE_OTHER): Payer: Medicare Other | Admitting: Family Medicine

## 2017-06-23 VITALS — BP 118/76 | HR 90 | Temp 97.9°F | Ht 64.0 in | Wt 129.0 lb

## 2017-06-23 DIAGNOSIS — I7300 Raynaud's syndrome without gangrene: Secondary | ICD-10-CM | POA: Diagnosis not present

## 2017-06-23 DIAGNOSIS — H3553 Other dystrophies primarily involving the sensory retina: Secondary | ICD-10-CM | POA: Diagnosis not present

## 2017-06-23 DIAGNOSIS — N3010 Interstitial cystitis (chronic) without hematuria: Secondary | ICD-10-CM

## 2017-06-23 DIAGNOSIS — M5136 Other intervertebral disc degeneration, lumbar region: Secondary | ICD-10-CM | POA: Diagnosis not present

## 2017-06-23 DIAGNOSIS — R7303 Prediabetes: Secondary | ICD-10-CM | POA: Diagnosis not present

## 2017-06-23 DIAGNOSIS — G4700 Insomnia, unspecified: Secondary | ICD-10-CM

## 2017-06-23 DIAGNOSIS — Z0000 Encounter for general adult medical examination without abnormal findings: Secondary | ICD-10-CM | POA: Diagnosis not present

## 2017-06-23 DIAGNOSIS — G8929 Other chronic pain: Secondary | ICD-10-CM

## 2017-06-23 DIAGNOSIS — M5126 Other intervertebral disc displacement, lumbar region: Secondary | ICD-10-CM | POA: Diagnosis not present

## 2017-06-23 DIAGNOSIS — E611 Iron deficiency: Secondary | ICD-10-CM | POA: Diagnosis not present

## 2017-06-23 DIAGNOSIS — M5030 Other cervical disc degeneration, unspecified cervical region: Secondary | ICD-10-CM | POA: Diagnosis not present

## 2017-06-23 DIAGNOSIS — M797 Fibromyalgia: Secondary | ICD-10-CM | POA: Diagnosis not present

## 2017-06-23 DIAGNOSIS — G9332 Myalgic encephalomyelitis/chronic fatigue syndrome: Secondary | ICD-10-CM

## 2017-06-23 DIAGNOSIS — F331 Major depressive disorder, recurrent, moderate: Secondary | ICD-10-CM

## 2017-06-23 DIAGNOSIS — G894 Chronic pain syndrome: Secondary | ICD-10-CM

## 2017-06-23 DIAGNOSIS — R5382 Chronic fatigue, unspecified: Secondary | ICD-10-CM

## 2017-06-23 MED ORDER — TRAMADOL HCL 50 MG PO TABS: 50.0000 mg | ORAL_TABLET | Freq: Two times a day (BID) | ORAL | 0 refills | Status: DC | PRN

## 2017-06-23 MED ORDER — FERROUS SULFATE 325 (65 FE) MG PO TABS: 325.0000 mg | ORAL_TABLET | ORAL | Status: DC

## 2017-06-23 NOTE — Patient Instructions (Addendum)
We will fill out lunesta PA.  Consider pain clinic referral.  Check with pharmacy about new 2 shot shingles series (shingrix).  Good to see you today call us with questions.  Health Maintenance, Female Adopting a healthy lifestyle and getting preventive care can go a long way to promote health and wellness. Talk with your health care provider about what schedule of regular examinations is right for you. This is a good chance for you to check in with your provider about disease prevention and staying healthy. In between checkups, there are plenty of things you can do on your own. Experts have done a lot of research about which lifestyle changes and preventive measures are most likely to keep you healthy. Ask your health care provider for more information. Weight and diet Eat a healthy diet  Be sure to include plenty of vegetables, fruits, low-fat dairy products, and lean protein.  Do not eat a lot of foods high in solid fats, added sugars, or salt.  Get regular exercise. This is one of the most important things you can do for your health. ? Most adults should exercise for at least 150 minutes each week. The exercise should increase your heart rate and make you sweat (moderate-intensity exercise). ? Most adults should also do strengthening exercises at least twice a week. This is in addition to the moderate-intensity exercise.  Maintain a healthy weight  Body mass index (BMI) is a measurement that can be used to identify possible weight problems. It estimates body fat based on height and weight. Your health care provider can help determine your BMI and help you achieve or maintain a healthy weight.  For females 75 years of age and older: ? A BMI below 18.5 is considered underweight. ? A BMI of 18.5 to 24.9 is normal. ? A BMI of 25 to 29.9 is considered overweight. ? A BMI of 30 and above is considered obese.  Watch levels of cholesterol and blood lipids  You should start having your  blood tested for lipids and cholesterol at 59 years of age, then have this test every 5 years.  You may need to have your cholesterol levels checked more often if: ? Your lipid or cholesterol levels are high. ? You are older than 59 years of age. ? You are at high risk for heart disease.  Cancer screening Lung Cancer  Lung cancer screening is recommended for adults 72-67 years old who are at high risk for lung cancer because of a history of smoking.  A yearly low-dose CT scan of the lungs is recommended for people who: ? Currently smoke. ? Have quit within the past 15 years. ? Have at least a 30-pack-year history of smoking. A pack year is smoking an average of one pack of cigarettes a day for 1 year.  Yearly screening should continue until it has been 15 years since you quit.  Yearly screening should stop if you develop a health problem that would prevent you from having lung cancer treatment.  Breast Cancer  Practice breast self-awareness. This means understanding how your breasts normally appear and feel.  It also means doing regular breast self-exams. Let your health care provider know about any changes, no matter how small.  If you are in your 20s or 30s, you should have a clinical breast exam (CBE) by a health care provider every 1-3 years as part of a regular health exam.  If you are 54 or older, have a CBE every year. Also consider  having a breast X-ray (mammogram) every year.  If you have a family history of breast cancer, talk to your health care provider about genetic screening.  If you are at high risk for breast cancer, talk to your health care provider about having an MRI and a mammogram every year.  Breast cancer gene (BRCA) assessment is recommended for women who have family members with BRCA-related cancers. BRCA-related cancers include: ? Breast. ? Ovarian. ? Tubal. ? Peritoneal cancers.  Results of the assessment will determine the need for genetic  counseling and BRCA1 and BRCA2 testing.  Cervical Cancer Your health care provider may recommend that you be screened regularly for cancer of the pelvic organs (ovaries, uterus, and vagina). This screening involves a pelvic examination, including checking for microscopic changes to the surface of your cervix (Pap test). You may be encouraged to have this screening done every 3 years, beginning at age 91.  For women ages 52-65, health care providers may recommend pelvic exams and Pap testing every 3 years, or they may recommend the Pap and pelvic exam, combined with testing for human papilloma virus (HPV), every 5 years. Some types of HPV increase your risk of cervical cancer. Testing for HPV may also be done on women of any age with unclear Pap test results.  Other health care providers may not recommend any screening for nonpregnant women who are considered low risk for pelvic cancer and who do not have symptoms. Ask your health care provider if a screening pelvic exam is right for you.  If you have had past treatment for cervical cancer or a condition that could lead to cancer, you need Pap tests and screening for cancer for at least 20 years after your treatment. If Pap tests have been discontinued, your risk factors (such as having a new sexual partner) need to be reassessed to determine if screening should resume. Some women have medical problems that increase the chance of getting cervical cancer. In these cases, your health care provider may recommend more frequent screening and Pap tests.  Colorectal Cancer  This type of cancer can be detected and often prevented.  Routine colorectal cancer screening usually begins at 59 years of age and continues through 59 years of age.  Your health care provider may recommend screening at an earlier age if you have risk factors for colon cancer.  Your health care provider may also recommend using home test kits to check for hidden blood in the  stool.  A small camera at the end of a tube can be used to examine your colon directly (sigmoidoscopy or colonoscopy). This is done to check for the earliest forms of colorectal cancer.  Routine screening usually begins at age 21.  Direct examination of the colon should be repeated every 5-10 years through 59 years of age. However, you may need to be screened more often if early forms of precancerous polyps or small growths are found.  Skin Cancer  Check your skin from head to toe regularly.  Tell your health care provider about any new moles or changes in moles, especially if there is a change in a mole's shape or color.  Also tell your health care provider if you have a mole that is larger than the size of a pencil eraser.  Always use sunscreen. Apply sunscreen liberally and repeatedly throughout the day.  Protect yourself by wearing long sleeves, pants, a wide-brimmed hat, and sunglasses whenever you are outside.  Heart disease, diabetes, and high blood pressure  High blood pressure causes heart disease and increases the risk of stroke. High blood pressure is more likely to develop in: ? People who have blood pressure in the high end of the normal range (130-139/85-89 mm Hg). ? People who are overweight or obese. ? People who are African American.  If you are 14-51 years of age, have your blood pressure checked every 3-5 years. If you are 50 years of age or older, have your blood pressure checked every year. You should have your blood pressure measured twice-once when you are at a hospital or clinic, and once when you are not at a hospital or clinic. Record the average of the two measurements. To check your blood pressure when you are not at a hospital or clinic, you can use: ? An automated blood pressure machine at a pharmacy. ? A home blood pressure monitor.  If you are between 6 years and 19 years old, ask your health care provider if you should take aspirin to prevent  strokes.  Have regular diabetes screenings. This involves taking a blood sample to check your fasting blood sugar level. ? If you are at a normal weight and have a low risk for diabetes, have this test once every three years after 59 years of age. ? If you are overweight and have a high risk for diabetes, consider being tested at a younger age or more often. Preventing infection Hepatitis B  If you have a higher risk for hepatitis B, you should be screened for this virus. You are considered at high risk for hepatitis B if: ? You were born in a country where hepatitis B is common. Ask your health care provider which countries are considered high risk. ? Your parents were born in a high-risk country, and you have not been immunized against hepatitis B (hepatitis B vaccine). ? You have HIV or AIDS. ? You use needles to inject street drugs. ? You live with someone who has hepatitis B. ? You have had sex with someone who has hepatitis B. ? You get hemodialysis treatment. ? You take certain medicines for conditions, including cancer, organ transplantation, and autoimmune conditions.  Hepatitis C  Blood testing is recommended for: ? Everyone born from [birth year] through 1963. ? Anyone with known risk factors for hepatitis C.  Sexually transmitted infections (STIs)  You should be screened for sexually transmitted infections (STIs) including gonorrhea and chlamydia if: ? You are sexually active and are younger than 59 years of age. ? You are older than 59 years of age and your health care provider tells you that you are at risk for this type of infection. ? Your sexual activity has changed since you were last screened and you are at an increased risk for chlamydia or gonorrhea. Ask your health care provider if you are at risk.  If you do not have HIV, but are at risk, it may be recommended that you take a prescription medicine daily to prevent HIV infection. This is called pre-exposure prophylaxis  (PrEP). You are considered at risk if: ? You are sexually active and do not regularly use condoms or know the HIV status of your partner(s). ? You take drugs by injection. ? You are sexually active with a partner who has HIV.  Talk with your health care provider about whether you are at high risk of being infected with HIV. If you choose to begin PrEP, you should first be tested for HIV. You should then be tested every 3 months  for as long as you are taking PrEP. Pregnancy  If you are premenopausal and you may become pregnant, ask your health care provider about preconception counseling.  If you may become pregnant, take 400 to 800 micrograms (mcg) of folic acid every day.  If you want to prevent pregnancy, talk to your health care provider about birth control (contraception). Osteoporosis and menopause  Osteoporosis is a disease in which the bones lose minerals and strength with aging. This can result in serious bone fractures. Your risk for osteoporosis can be identified using a bone density scan.  If you are 8 years of age or older, or if you are at risk for osteoporosis and fractures, ask your health care provider if you should be screened.  Ask your health care provider whether you should take a calcium or vitamin D supplement to lower your risk for osteoporosis.  Menopause may have certain physical symptoms and risks.  Hormone replacement therapy may reduce some of these symptoms and risks. Talk to your health care provider about whether hormone replacement therapy is right for you. Follow these instructions at home:  Schedule regular health, dental, and eye exams.  Stay current with your immunizations.  Do not use any tobacco products including cigarettes, chewing tobacco, or electronic cigarettes.  If you are pregnant, do not drink alcohol.  If you are breastfeeding, limit how much and how often you drink alcohol.  Limit alcohol intake to no more than 1 drink per day for  nonpregnant women. One drink equals 12 ounces of beer, 5 ounces of wine, or 1 ounces of hard liquor.  Do not use street drugs.  Do not share needles.  Ask your health care provider for help if you need support or information about quitting drugs.  Tell your health care provider if you often feel depressed.  Tell your health care provider if you have ever been abused or do not feel safe at home. This information is not intended to replace advice given to you by your health care provider. Make sure you discuss any questions you have with your health care provider. Document Released: 01/24/2011 Document Revised: 12/17/2015 Document Reviewed: 04/14/2015 Elsevier Interactive Patient Education  [employer].

## 2017-06-23 NOTE — Assessment & Plan Note (Signed)
Iron studies normal. I suggested she decrease iron to once weekly.

## 2017-06-23 NOTE — Assessment & Plan Note (Addendum)
Endorses progressively worsening pain control despite current regimen of embeda and rare tramadol. Has been on embeda at least since she's come to see me ~4 yrs ago. Discussed pain clinic referral for evaluation. She will consider this.  [place] CSRS reviewed. She states she will no longer be able to receive 90d supply from her pharmacy.  Discussed new policy at our office for 3 month OV for chronic narcotic management.

## 2017-06-23 NOTE — Assessment & Plan Note (Signed)
Reviewed with patient, encouraged decreased added sugar in diet.

## 2017-06-23 NOTE — Assessment & Plan Note (Addendum)
Has been told will likely go blind within 5 yrs. Started Patent examiner.

## 2017-06-23 NOTE — Progress Notes (Signed)
BP 118/76 (BP Location: Left Arm, Patient Position: Sitting, Cuff Size: Normal)   Pulse 90   Temp 97.9 F (36.6 C) (Oral)   Ht 5\' 4"  (1.626 m)   Wt 129 lb (58.5 kg)   LMP 07/25/1998   SpO2 97%   BMI 22.14 kg/m    CC: CPE Subjective:    Patient ID: Rachel Duran, female    DOB: [birth year], 61 y.o.   MRN: [id number]  HPI: Rachel Duran is a 61 y.o. female presenting on 06/23/2017 for Annual Exam (Pt 2)   Saw Katha Cabal earlier this week for medicare wellness visit. Note reviewed.   Rough last few months due to worsening pain - saw rheum and neurosurgeon - has hit plateau, likely will not improve. 2 ruptured discs (thoracic and lumbar). Started seeing [place] PT, working on home exercise regimen. Has been recommended aquatic exercise.   Ongoing insomnia - joint pains wake her up. lunesta will need PA. Has tried multiple medications: trazodone ineffective. Belsomra not tolerated - stumbling, confusion, nightmares. Thinks she's tried and failed rozerem as well.   Preventative: Positive cologuard 10/2016. Has decided against pursuing colonoscopy. Did not have good experience with flex sigmoidoscopy. H/o hemorrhoid surgeries in the past s/p banding. I asked her to check with GI about other options.  Well woman with Dr Josefa Half OBGYN seen 12/2016.  Mammogram WNL 10/2016  dexa - 2014 with mild osteopenia per Dr Quincy Simmonds Flu shot - yearly pneumova 2011, prevnar 2015 Td 2007 Advanced care planning - HCPOA scanned into chart 06/2015. HCPOA is husband Rachel Duran. Does not want prolonged life support if terminally ill. Seat belt use discussed  Sunscreen use discussed. No changing moles. Sees derm.  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 Interim medical history since our last visit reviewed. Allergies and medications reviewed and updated. Outpatient Medications Prior to Visit  Medication Sig Dispense Refill  . acyclovir (ZOVIRAX) 800 MG tablet TAKE ONE-HALF TABLET BY  MOUTH EVERY DAY 45 tablet 1  . ALPRAZolam (XANAX) 1 MG tablet Take 1 tablet (1 mg total) by mouth 3 (three) times daily as needed for anxiety. With 1/2 tab at night time 60 tablet 0  . amLODipine (NORVASC) 5 MG tablet TAKE 1 TABLET BY MOUTH  DAILY 90 tablet 3  . amphetamine-dextroamphetamine (ADDERALL XR) 30 MG 24 hr capsule Take 1 capsule (30 mg total) every morning by mouth. 90 capsule 0  . ARIPiprazole (ABILIFY) 5 MG tablet Take 5 mg by mouth daily.     Marland Kitchen aspirin EC 81 MG tablet Take 81 mg by mouth every other day.     . Calcium-Magnesium-Vitamin D [id number] MG-MG-UNIT TB24 Take 1 tablet by mouth 2 (two) times daily.    . ciclopirox (PENLAC) 8 % solution     . cyanocobalamin (,VITAMIN B-12,) 1000 MCG/ML injection INJECT INTRAMUSCULARLY 1ML  (1000MCG) EVERY 21 DAYS  (DISCARD 28 DAYS AFTER  FIRST USE.) 3 mL 1  . diclofenac sodium (VOLTAREN) 1 % GEL Voltaren Gel 3 grams to 3 large joints upto TID 3 TUBES with 3 refills 3 Tube 3  . esomeprazole (NEXIUM) 40 MG capsule TAKE 1 CAPSULE BY MOUTH TWICE DAILY BEFORE MEALS 180 capsule 1  . Eszopiclone 3 MG TABS TAKE  ONE TABLET AT BEDTIME 90 tablet 0  . fluticasone (FLONASE) 50 MCG/ACT nasal spray 2 SPRAYS INTO BOTH NOSTRILS DAILY 16 g 6  . lamoTRIgine (LAMICTAL) 200 MG tablet Take 200 mg by mouth daily.    Marland Kitchen linaclotide (LINZESS) 290 MCG CAPS capsule Take 1 capsule (290 mcg total) by mouth daily. 90 capsule 3  . magic mouthwash w/lidocaine SOLN Take 5 mLs by mouth 3 (three) times daily as needed for mouth pain. Benadryl/viscous lidocaine 2%/maalox 120 mL 0  . MAGNESIUM PO Take 1,000 mg by mouth 3 (three) times daily.    . Manganese 50 MG TABS Take 50 mg by mouth daily.     . Morphine-Naltrexone  80-3.2 MG CPCR Take 1 tablet daily by mouth. 90 capsule 0  . Multiple Vitamins-Minerals (ICAPS AREDS 2) CAPS Take 1 capsule by mouth daily.    . NONFORMULARY OR COMPOUNDED ITEM Testosterone propionate 2% in white petrolatum, apply once daily. 60 each 1  . ondansetron (ZOFRAN) 4 MG tablet TAKE 1 TABLET BY MOUTH EVERY 8 HOURS AS NEEDED FOR NAUSEA OR VOMITING 30 tablet 0  . pentosan polysulfate (ELMIRON) 100 MG capsule Take 100 mg by mouth 3 (three) times daily.    . phenazopyridine (PYRIDIUM) 100 MG tablet Take 1 tablet (100 mg total) by mouth 3 (three) times daily as needed for pain. 20 tablet 0  . Polyethylene Glycol 3350 (MIRALAX PO) Take 17 g by mouth 4 (four) times daily.     . pregabalin (LYRICA) 150 MG capsule Take 1 capsule (150 mg total) by mouth 3 (three) times daily. 270 capsule 0  . ursodiol (ACTIGALL) 300 MG capsule TAKE 1 CAPSULE TWICE DAILY 60 capsule 1  . YUVAFEM 10 MCG TABS vaginal tablet Place 1 tablet (10 mcg total) vaginally 3 (three) times a week. 36 tablet 3  . ferrous sulfate 325 (65 FE) MG tablet Take 1 tablet (325 mg total) by mouth every Monday, Wednesday, and Friday.    . traMADol (ULTRAM) 50 MG tablet Take 1 tablet (50 mg total) by mouth 2 (two) times daily as needed. 40 tablet 0   Facility-Administered Medications Prior to Visit  Medication Dose Route Frequency Provider Last Rate Last Dose  . 0.9 %  sodium chloride infusion  500 mL Intravenous Continuous [name], MD         Per HPI unless specifically indicated in ROS section below Review of Systems  Constitutional: Negative for activity change, appetite change, chills, fatigue, fever and unexpected weight change.  HENT: Negative for hearing loss.   Eyes: Negative for visual disturbance.  Respiratory: Negative for cough, chest tightness, shortness of breath and wheezing.   Cardiovascular: Negative for chest pain, palpitations and leg swelling.  Gastrointestinal: Positive for abdominal pain (after  thanksgiving). Negative for abdominal distention, blood in stool, constipation, diarrhea, nausea and vomiting.  Genitourinary: Negative for difficulty urinating and hematuria.  Musculoskeletal: Positive for arthralgias. Negative for myalgias and neck pain.  Skin: Negative for rash.  Neurological: Negative for dizziness, seizures, syncope and headaches.  Hematological: Negative for adenopathy. Does not bruise/bleed easily.  Psychiatric/Behavioral: Positive for dysphoric mood. The patient is nervous/anxious.        Followed by psychiatrist and psychologist Altha Harm Saffo)       Objective:    BP 118/76 (BP Location: Left Arm, Patient Position: Sitting, Cuff Size: Normal)   Pulse 90   Temp 97.9 F (36.6 C) (Oral)   Ht 5\' 4"  (1.626 m)   Wt 129 lb (58.5  kg)   LMP 07/25/1998   SpO2 97%   BMI 22.14 kg/m   Wt Readings from Last 3 Encounters:  06/23/17 129 lb (58.5 kg)  06/21/17 132 lb 4 oz (60 kg)  04/10/17 137 lb (62.1 kg)    Physical Exam  Constitutional: She is oriented to person, place, and time. She appears well-developed and well-nourished. No distress.  HENT:  Head: Normocephalic and atraumatic.  Right Ear: Hearing, tympanic membrane, external ear and ear canal normal.  Left Ear: Hearing, tympanic membrane, external ear and ear canal normal.  Nose: Nose normal.  Mouth/Throat: Uvula is midline, oropharynx is clear and moist and mucous membranes are normal. No oropharyngeal exudate, posterior oropharyngeal edema or posterior oropharyngeal erythema.  Eyes: Conjunctivae and EOM are normal. Pupils are equal, round, and reactive to light. No scleral icterus.  Neck: Normal range of motion. Neck supple.  Cardiovascular: Normal rate, regular rhythm, normal heart sounds and intact distal pulses.  No murmur heard. Pulses:      Radial pulses are 2+ on the right side, and 2+ on the left side.  Pulmonary/Chest: Effort normal and breath sounds normal. No respiratory distress. She has no  wheezes. She has no rales.  Abdominal: Soft. Bowel sounds are normal. She exhibits no distension and no mass. There is no tenderness. There is no rebound and no guarding.  Musculoskeletal: Normal range of motion. She exhibits no edema.  Lymphadenopathy:    She has no cervical adenopathy.  Neurological: She is alert and oriented to person, place, and time.  CN grossly intact, station and gait intact  Skin: Skin is warm and dry. No rash noted.  Psychiatric: She has a normal mood and affect. Her behavior is normal. Judgment and thought content normal.  Nursing note and vitals reviewed.  Results for orders placed or performed in visit on 06/21/17  IBC panel  Result Value Ref Range   Iron 88 42 - 145 ug/dL   Transferrin 282.0 212.0 - 360.0 mg/dL   Saturation Ratios 22.3 20.0 - 50.0 %  Ferritin  Result Value Ref Range   Ferritin 61.2 10.0 - 291.0 ng/mL  Vitamin B12  Result Value Ref Range   Vitamin B-12 908 211 - 911 pg/mL  Hemoglobin A1c  Result Value Ref Range   Hgb A1c MFr Bld 5.9 4.6 - 6.5 %  T4, free  Result Value Ref Range   Free T4 0.82 0.60 - 1.60 ng/dL  Basic metabolic panel  Result Value Ref Range   Sodium 139 135 - 145 mEq/L   Potassium 4.4 3.5 - 5.1 mEq/L   Chloride 103 96 - 112 mEq/L   CO2 31 19 - 32 mEq/L   Glucose, Bld 117 (H) 70 - 99 mg/dL   BUN 11 6 - 23 mg/dL   Creatinine, Ser 0.59 0.40 - 1.20 mg/dL   Calcium 9.5 8.4 - 10.5 mg/dL   GFR 110.73 >60.00 mL/min  TSH  Result Value Ref Range   TSH 1.78 0.35 - 4.50 uIU/mL  Lipid panel  Result Value Ref Range   Cholesterol 208 (H) 0 - 200 mg/dL   Triglycerides 151.0 (H) 0.0 - 149.0 mg/dL   HDL 55.80 >39.00 mg/dL   VLDL 30.2 0.0 - 40.0 mg/dL   LDL Cholesterol 122 (H) 0 - 99 mg/dL   Total CHOL/HDL Ratio 4    NonHDL 152.41       Assessment & Plan:   Problem List Items Addressed This Visit    CFS (chronic fatigue syndrome)  Chronic pain syndrome    On embeda and tramadol. Latest UDS 02/2017      CYSTITIS,  CHRONIC INTERSTITIAL   DDD (degenerative disc disease), cervical   Relevant Medications   traMADol (ULTRAM) 50 MG tablet   DDD (degenerative disc disease), lumbar   Relevant Medications   traMADol (ULTRAM) 50 MG tablet   Encounter for chronic pain management    Endorses progressively worsening pain control despite current regimen of embeda and rare tramadol. Has been on embeda at least since she's come to see me ~4 yrs ago. Discussed pain clinic referral for evaluation. She will consider this.  [place] CSRS reviewed. She states she will no longer be able to receive 90d supply from her pharmacy.  Discussed new policy at our office for 3 month OV for chronic narcotic management.       Fibromyalgia   Health maintenance examination - Primary    Preventative protocols reviewed and updated unless pt declined. Discussed healthy diet and lifestyle.       HNP (herniated nucleus pulposus), lumbar    Sees Dr [name] neurosurgery.       INSOMNIA, CHRONIC    Chronic, longstanding. Lunesta most effective, PA process started. Tried and failed OTCs, ambien, silenor, trazodone, belsomra, rozerem, amitriptyline and several other medications.       Iron deficiency    Iron studies normal. I suggested she decrease iron to once weekly.       MDD (major depressive disorder), recurrent episode, moderate (HCC)    Chronic, stable. Difficult year. Followed by psychiatrist (Dr Toy Care) and new counselor in [facility]       Prediabetes    Reviewed with patient, encouraged decreased added sugar in diet.       [name] syndrome   Stargardt's disease    Has been told will likely go blind within 5 yrs. Started Patent examiner.           Follow up plan: Return in about 3 months (around 09/21/2017) for follow up visit.  Ria Bush, MD

## 2017-06-23 NOTE — Assessment & Plan Note (Signed)
On embeda and tramadol. Latest UDS 02/2017

## 2017-06-23 NOTE — Telephone Encounter (Signed)
Started PA, key:  [place], [place] case ID:  [id number].  PA approved through 07/24/2018.  Notified [facility] pharmacy.

## 2017-06-23 NOTE — Assessment & Plan Note (Addendum)
Chronic, longstanding. Lunesta most effective, PA process started. Tried and failed OTCs, ambien, silenor, trazodone, belsomra, rozerem, amitriptyline and several other medications.

## 2017-06-23 NOTE — Assessment & Plan Note (Addendum)
Chronic, stable. Difficult year. Followed by psychiatrist (Dr Toy Care) and new counselor in [place]

## 2017-06-23 NOTE — Assessment & Plan Note (Signed)
Sees Dr [name] neurosurgery.

## 2017-06-23 NOTE — Assessment & Plan Note (Signed)
Preventative protocols reviewed and updated unless pt declined. Discussed healthy diet and lifestyle.  

## 2017-06-25 NOTE — Progress Notes (Signed)
I reviewed health advisor's note, was available for consultation, and agree with documentation and plan.  

## 2017-06-27 NOTE — Telephone Encounter (Signed)
Received faxed approval. 

## 2017-07-09 ENCOUNTER — Other Ambulatory Visit: Payer: Self-pay | Admitting: Family Medicine

## 2017-07-10 NOTE — Telephone Encounter (Signed)
Last filled:  04/23/17, #45 Last OV (CPE):  06/23/17 Next OV:  09/22/17

## 2017-07-11 ENCOUNTER — Other Ambulatory Visit: Payer: Self-pay | Admitting: Family Medicine

## 2017-07-11 NOTE — Telephone Encounter (Signed)
Last filled:  04/19/17, #90 Last OV (CPE):  06/23/17 Next OV:  09/22/17

## 2017-07-11 NOTE — Telephone Encounter (Signed)
Sent electronically 

## 2017-07-21 ENCOUNTER — Other Ambulatory Visit: Payer: Self-pay | Admitting: Gastroenterology

## 2017-07-23 ENCOUNTER — Encounter: Payer: Self-pay | Admitting: Family Medicine

## 2017-07-31 ENCOUNTER — Telehealth: Payer: Self-pay | Admitting: Obstetrics and Gynecology

## 2017-07-31 ENCOUNTER — Encounter: Payer: Self-pay | Admitting: Obstetrics and Gynecology

## 2017-07-31 DIAGNOSIS — R6882 Decreased libido: Secondary | ICD-10-CM

## 2017-07-31 NOTE — Telephone Encounter (Signed)
-----   Message from [place], Generic sent at 07/31/2017 4:40 PM EST -----    I Dr. Quincy Simmonds, hope this finds you well and enjoying the New Year! As per our discussion when I saw you last year for my physical I have been continuing to use my testosterone as you prescribed. Today I called [facility] because it was time for me to fill it and there were no remaining refills available. I just received a phone call from the pharmacy telling me that your office denied the request, which I don't understand. You and I discussed this in great lengths when I was there last and I explained that the medication helped me immensely, and we agreed for me to continue with it, there was even a discussion on how many times a week to use it. Unfortunately the last time I had to refill it after all refills had ran out this happened then also. Would you please look into this and let me know what is going on. Thank you for your time and attention to this. Have a nice evening!    Rachel Duran

## 2017-08-01 NOTE — Telephone Encounter (Signed)
Spoke with patient. Requesting refill for Testosterone propionate 2%.   Last testosterone level 74.1 on 01/13/17. Patient increased to daily for 7 days, did not return for 6 week testosterone level.   Uses cream daily roughly 5 days a week, works well for her.   Last refill 03/03/17 #60 grams/1RF Dr. Quincy Simmonds  Advised patient Dr. Quincy Simmonds is on LOA, will review refill with covering provider and return call with recommendations. Will likely need to repeat testosterone level prior to refill. Patient is agreeable.   Next AEX 01/17/18 Last MMG 10/25/16, f/u 11/03/16 -Annual screening recommended   Dr. [name] Heck -please review and advise on refill?

## 2017-08-02 ENCOUNTER — Ambulatory Visit (INDEPENDENT_AMBULATORY_CARE_PROVIDER_SITE_OTHER): Payer: Medicare Other | Admitting: Family Medicine

## 2017-08-02 ENCOUNTER — Encounter: Payer: Self-pay | Admitting: Family Medicine

## 2017-08-02 VITALS — BP 118/70 | HR 100 | Temp 98.6°F | Wt 131.0 lb

## 2017-08-02 DIAGNOSIS — J029 Acute pharyngitis, unspecified: Secondary | ICD-10-CM

## 2017-08-02 DIAGNOSIS — J019 Acute sinusitis, unspecified: Secondary | ICD-10-CM | POA: Insufficient documentation

## 2017-08-02 LAB — POCT RAPID STREP A (OFFICE): Rapid Strep A Screen: NEGATIVE

## 2017-08-02 NOTE — Patient Instructions (Signed)
You have a sinus infection, likely viral. Start flonase nasal steroid, plain mucinex with large glass of water to help mobilize mucous, continue salt water gargles.  Push fluids and plenty of rest. Nasal saline irrigation or neti pot to help drain sinuses. If symptoms ongoing past 7-10 days or worsening productive cough or fever >101, please let us know for further treatment.

## 2017-08-02 NOTE — Progress Notes (Signed)
BP 118/70 (BP Location: Left Arm, Patient Position: Sitting, Cuff Size: Normal)   Pulse 100   Temp 98.6 F (37 C) (Oral)   Wt 131 lb (59.4 kg)   LMP 07/25/1998   SpO2 98%   BMI 22.49 kg/m    CC: sinus congestion Subjective:    Patient ID: Rachel Duran, female    DOB: 11/18/1955, 62 y.o.   MRN: [id number]  HPI: Rachel Duran is a 62 y.o. female presenting on 08/02/2017 for Sinus Problem (Started 07/30/17 with sore throat, facial pressure, nasal discharge- green. Tried Tylenol, warm salt gargle and saline nasal spray.)   4d h/o ST, earache, blowing nose with thick green and yellow mucous. Tender glands. Some body ache. Yesterday felt chills. Mild cough and PNdrainage. Some maxillary sinus pressure and headache.    No fever, chest pain, dyspnea or wheezing.  Has tried salt water gargles and tylenol and nasal saline spray.   69 yo grandson sick last week with cold. Treated with abx.  Non smoker No h/o asthma.   Relevant past medical, surgical, family and social history reviewed and updated as indicated. Interim medical history since our last visit reviewed. Allergies and medications reviewed and updated. Outpatient Medications Prior to Visit  Medication Sig Dispense Refill  . acyclovir (ZOVIRAX) 800 MG tablet TAKE ONE-HALF TABLET BY  MOUTH EVERY DAY 45 tablet 1  . ALPRAZolam (XANAX) 1 MG tablet Take 1 tablet (1 mg total) by mouth 3 (three) times daily as needed for anxiety. With 1/2 tab at night time 60 tablet 0  . amLODipine (NORVASC) 5 MG tablet TAKE 1 TABLET BY MOUTH  DAILY 90 tablet 3  . amphetamine-dextroamphetamine (ADDERALL XR) 30 MG 24 hr capsule Take 1 capsule (30 mg total) every morning by mouth. 90 capsule 0  . ARIPiprazole (ABILIFY) 5 MG tablet Take 5 mg by mouth daily.     Marland Kitchen aspirin EC 81 MG tablet Take 81 mg by mouth every other day.     . Calcium-Magnesium-Vitamin D [id number] MG-MG-UNIT TB24 Take 1 tablet by mouth 2 (two) times daily.    . ciclopirox (PENLAC) 8  % solution     . cyanocobalamin (,VITAMIN B-12,) 1000 MCG/ML injection INJECT INTRAMUSCULARLY 1ML  (1000MCG) EVERY 21 DAYS  (DISCARD 28 DAYS AFTER  FIRST USE.) 3 mL 1  . diclofenac sodium (VOLTAREN) 1 % GEL Voltaren Gel 3 grams to 3 large joints upto TID 3 TUBES with 3 refills 3 Tube 3  . esomeprazole (NEXIUM) 40 MG capsule TAKE 1 CAPSULE BY MOUTH TWICE DAILY BEFORE MEALS 180 capsule 1  . Eszopiclone 3 MG TABS TAKE ONE TABLET BY MOUTH AT BEDTIME 90 tablet 0  . ferrous sulfate 325 (65 FE) MG tablet Take 1 tablet (325 mg total) by mouth once a week.    . fluticasone (FLONASE) 50 MCG/ACT nasal spray 2 SPRAYS INTO BOTH NOSTRILS DAILY 16 g 6  . lamoTRIgine (LAMICTAL) 200 MG tablet Take 200 mg by mouth daily.    Marland Kitchen linaclotide (LINZESS) 290 MCG CAPS capsule Take 1 capsule (290 mcg total) by mouth daily. 90 capsule 3  . magic mouthwash w/lidocaine SOLN Take 5 mLs by mouth 3 (three) times daily as needed for mouth pain. Benadryl/viscous lidocaine 2%/maalox 120 mL 0  . MAGNESIUM PO Take 1,000 mg by mouth 3 (three) times daily.    . Manganese 50 MG TABS Take 50 mg by mouth daily.     . Morphine-Naltrexone 80-3.2 MG CPCR Take 1 tablet daily  by mouth. 90 capsule 0  . Multiple Vitamins-Minerals (ICAPS AREDS 2) CAPS Take 1 capsule by mouth daily.    . NONFORMULARY OR COMPOUNDED ITEM Testosterone propionate 2% in white petrolatum, apply once daily. 60 each 1  . ondansetron (ZOFRAN) 4 MG tablet TAKE 1 TABLET BY MOUTH EVERY 8 HOURS AS NEEDED FOR NAUSEA OR VOMITING 30 tablet 0  . pentosan polysulfate (ELMIRON) 100 MG capsule Take 100 mg by mouth 3 (three) times daily.    . phenazopyridine (PYRIDIUM) 100 MG tablet Take 1 tablet (100 mg total) by mouth 3 (three) times daily as needed for pain. 20 tablet 0  . Polyethylene Glycol 3350 (MIRALAX PO) Take 17 g by mouth 4 (four) times daily.     . pregabalin (LYRICA) 150 MG capsule Take 1 capsule (150 mg total) by mouth 3 (three) times daily. 270 capsule 0  . traMADol  (ULTRAM) 50 MG tablet Take 1 tablet (50 mg total) by mouth 2 (two) times daily as needed. 40 tablet 0  . ursodiol (ACTIGALL) 300 MG capsule TAKE 1 CAPSULE TWICE DAILY 60 capsule 0  . YUVAFEM 10 MCG TABS vaginal tablet Place 1 tablet (10 mcg total) vaginally 3 (three) times a week. 36 tablet 3   Facility-Administered Medications Prior to Visit  Medication Dose Route Frequency Provider Last Rate Last Dose  . 0.9 %  sodium chloride infusion  500 mL Intravenous Continuous [name], MD         Per HPI unless specifically indicated in ROS section below Review of Systems     Objective:    BP 118/70 (BP Location: Left Arm, Patient Position: Sitting, Cuff Size: Normal)   Pulse 100   Temp 98.6 F (37 C) (Oral)   Wt 131 lb (59.4 kg)   LMP 07/25/1998   SpO2 98%   BMI 22.49 kg/m   Wt Readings from Last 3 Encounters:  08/02/17 131 lb (59.4 kg)  06/23/17 129 lb (58.5 kg)  06/21/17 132 lb 4 oz (60 kg)    Physical Exam  Constitutional: She appears well-developed and well-nourished. No distress.  HENT:  Head: Normocephalic and atraumatic.  Right Ear: Hearing, tympanic membrane, external ear and ear canal normal.  Left Ear: Hearing, tympanic membrane, external ear and ear canal normal.  Nose: Mucosal edema (and congestion with mucous) present. No rhinorrhea. Right sinus exhibits maxillary sinus tenderness (and ethmoidal). Right sinus exhibits no frontal sinus tenderness. Left sinus exhibits maxillary sinus tenderness (and ethmoidal). Left sinus exhibits no frontal sinus tenderness.  Mouth/Throat: Uvula is midline and mucous membranes are normal. Posterior oropharyngeal erythema (mild) present. No oropharyngeal exudate, posterior oropharyngeal edema or tonsillar abscesses.  S/p tonsillectomy  Eyes: Conjunctivae and EOM are normal. Pupils are equal, round, and reactive to light. No scleral icterus.  Neck: Normal range of motion. Neck supple.  Cardiovascular: Normal rate, regular rhythm,  normal heart sounds and intact distal pulses.  No murmur heard. Pulmonary/Chest: Effort normal and breath sounds normal. No respiratory distress. She has no wheezes. She has no rales.  Lymphadenopathy:    She has cervical adenopathy (R AC LAD).  Skin: Skin is warm and dry. No rash noted.  Nursing note and vitals reviewed.  Results for orders placed or performed in visit on 08/02/17  POCT rapid strep A  Result Value Ref Range   Rapid Strep A Screen Negative Negative      Assessment & Plan:   Problem List Items Addressed This Visit    Acute sinusitis -  Primary    RST negative Anticipate viral given short duration.  Ongoing supportive care reviewed.  See pt instructions. Red flags to seek further care also discussed.  She has flonase at home.        Other Visit Diagnoses    Sore throat       Relevant Orders   POCT rapid strep A (Completed)       Follow up plan: Return if symptoms worsen or fail to improve.  Ria Bush, MD

## 2017-08-02 NOTE — Assessment & Plan Note (Addendum)
RST negative Anticipate viral given short duration.  Ongoing supportive care reviewed.  See pt instructions. Red flags to seek further care also discussed.  She has flonase at home.

## 2017-08-03 NOTE — Telephone Encounter (Signed)
Left detailed message, ok per current dpr. Advised testosterone lab needed before refills, please return call to office to schedule lab appt or with any additional questions.

## 2017-08-03 NOTE — Telephone Encounter (Signed)
Patient scheduled for lab appt, will close encounter.

## 2017-08-03 NOTE — Telephone Encounter (Signed)
Patient returned call to schedule lab appointment. Lab appointment scheduled 08/10/17.

## 2017-08-03 NOTE — Telephone Encounter (Signed)
She needs a total testosterone level before refills.  Please schedule lab test for her.  Thanks.

## 2017-08-04 ENCOUNTER — Encounter: Payer: Self-pay | Admitting: Family Medicine

## 2017-08-04 MED ORDER — AMOXICILLIN-POT CLAVULANATE 875-125 MG PO TABS: 1.0000 | ORAL_TABLET | Freq: Two times a day (BID) | ORAL | 0 refills | Status: AC

## 2017-08-08 ENCOUNTER — Encounter: Payer: Self-pay | Admitting: Family Medicine

## 2017-08-10 ENCOUNTER — Telehealth: Payer: Self-pay

## 2017-08-10 ENCOUNTER — Other Ambulatory Visit (INDEPENDENT_AMBULATORY_CARE_PROVIDER_SITE_OTHER): Payer: Medicare Other

## 2017-08-10 DIAGNOSIS — R6882 Decreased libido: Secondary | ICD-10-CM

## 2017-08-10 NOTE — Telephone Encounter (Signed)
Spoke with patient while in office for lab draw today. States that she has been on compounded testosterone for many years and does not do well without it. Reports she does not have a libido without using this prescription and her quality of life is not the same without it. Would like to send this information to covering MD who will be reviewing her results while Dr.Silva is out of the office. Advised I will send this message and she will be notified of results once they return.  Routing to covering provider for final review. Patient agreeable to disposition. Will close encounter.

## 2017-08-16 ENCOUNTER — Other Ambulatory Visit: Payer: Self-pay | Admitting: Family Medicine

## 2017-08-16 LAB — TESTOSTERONE, TOTAL, LC/MS/MS: Testosterone, total: 14.2 ng/dL

## 2017-08-17 ENCOUNTER — Telehealth: Payer: Self-pay | Admitting: Rheumatology

## 2017-08-17 NOTE — Telephone Encounter (Signed)
Patient left a voicemail to let the office know that her medication was denied at [facility].  Please call patient at [phone]

## 2017-08-17 NOTE — Telephone Encounter (Signed)
Patient advised her prescription for the Indomethacin Gel was not denied . Advised patient that since it is a compounded prescription we have to call and give verbal authorization. Patient states she will call the office next time she needs refill to save with confusion. She gets Indomethacin PLO (60 ML) 10 % gel.  Gave verbal to fill with 2 additional refills.  Last Visit: 04/10/17 Next Visit: 10/06/17  Okay per Dr. Estanislado Pandy

## 2017-08-24 ENCOUNTER — Telehealth: Payer: Self-pay | Admitting: Obstetrics and Gynecology

## 2017-08-24 NOTE — Telephone Encounter (Signed)
Patient calling to check status of results and a prescription.

## 2017-08-24 NOTE — Telephone Encounter (Signed)
I reviewed the patient's labs, and her testosterone level is much lower this time.  It measures 14.2 now and it was 72.1 last time. Please confirm how she is currently using the testosterone.  How is she doing with her symptoms?

## 2017-08-24 NOTE — Telephone Encounter (Signed)
Dr. Quincy Simmonds, please review testosterone labs dated 08/10/17 and advise on testosterone RX refill.

## 2017-08-25 MED ORDER — NONFORMULARY OR COMPOUNDED ITEM: 1 refills | Status: DC

## 2017-08-25 NOTE — Telephone Encounter (Signed)
[place] for refill of Testosterone propionate 2% in petrolatum for her pharmacy of choice. Apply on small amount daily for 5 days per week.  I can recheck her level at her annual exam, which I believe is due in June 2019.

## 2017-08-25 NOTE — Telephone Encounter (Signed)
Left detailed message, ok per current dpr. Advised as seen below per Dr. Quincy Simmonds. RX sent to [facility] as requested, f/u for filling. Return call to office with any additional questions.   Routing to provider for final review. Patient is agreeable to disposition. Will close encounter.

## 2017-08-25 NOTE — Telephone Encounter (Signed)
Printed Rx to Dr. Quincy Simmonds for signature.

## 2017-08-25 NOTE — Telephone Encounter (Signed)
Spoke with patient. Advised of results as seen below per Dr. Quincy Simmonds. Patient states she has been out of the medication for 6-7 wks. Reports decreased libido, vaginal dryness and discomfort with intercourse.  Prior to running out of the medication she was using daily 5 days a week.   Patient requesting refill with [facility]. Advised patient will review with Dr. Quincy Simmonds and return call regarding refill.   Dr. Quincy Simmonds -please advise on refill?

## 2017-08-27 ENCOUNTER — Other Ambulatory Visit: Payer: Self-pay | Admitting: Family Medicine

## 2017-08-29 ENCOUNTER — Other Ambulatory Visit: Payer: Self-pay

## 2017-08-29 ENCOUNTER — Encounter: Payer: Self-pay | Admitting: Family Medicine

## 2017-08-29 NOTE — Telephone Encounter (Signed)
See TE, 08/29/17.

## 2017-08-29 NOTE — Telephone Encounter (Signed)
Morphine-Naltrexone last printed:  06/07/17, #90 Adderall last printed:  06/07/17 Last OV:  08/02/17 Next OV:  09/22/17  Pt can no longer have 90 day rx for morphine per her ins co. [See pt email, 08/29/17.]

## 2017-08-29 NOTE — Telephone Encounter (Signed)
Patient last seen acute: 07/2017 Last annual exam: 05/2017 Last Refill:  #90, 3 refills in January of 2018.  Will refill #90, 3 refills okay per protocol.

## 2017-08-30 MED ORDER — MORPHINE-NALTREXONE 80-3.2 MG PO CPCR: 1.0000 | ORAL_CAPSULE | Freq: Every day | ORAL | 0 refills | Status: DC

## 2017-08-30 MED ORDER — AMPHETAMINE-DEXTROAMPHET ER 30 MG PO CP24: 30.0000 mg | ORAL_CAPSULE | ORAL | 0 refills | Status: DC

## 2017-08-30 NOTE — Telephone Encounter (Signed)
Sent electronically 

## 2017-08-30 NOTE — Telephone Encounter (Signed)
FYI, I have deleted [facility] from pt's chart.

## 2017-08-31 ENCOUNTER — Telehealth: Payer: Self-pay | Admitting: Family Medicine

## 2017-08-31 MED ORDER — MORPHINE-NALTREXONE 80-3.2 MG PO CPCR: 1.0000 | ORAL_CAPSULE | Freq: Every day | ORAL | 0 refills | Status: DC

## 2017-08-31 NOTE — Telephone Encounter (Signed)
embeda sent [facility].  Awaiting pt choice about where to send adderall this month.

## 2017-08-31 NOTE — Addendum Note (Signed)
Addended by: Ria Bush on: 08/31/2017 01:57 PM   Modules accepted: Orders

## 2017-08-31 NOTE — Telephone Encounter (Signed)
Spoke with pt to get clarification of message. She states [facility] has found both the Embeda and Adderall rxs and will send as rush shipment to pt. States she no longer uses [facility].  Informed her that pharmacy has been removed from her chart.  Per pt all future rxs from Dr. Darnell Level will go to [place].  I let pt know Dr. Darnell Level sent rx for Embeda to [facility] today just before 2:00 PM.  Says she will contact them to let them know she will not need it.  Pt expresses her thanks to Dr. Darnell Level for taking care of this for her.

## 2017-08-31 NOTE — Telephone Encounter (Signed)
Copied from [place] [id number]. Topic: Quick Communication - See Telephone Encounter >> Aug 31, 2017 11:43 AM Percell Belt A wrote: CRM for notification. See Telephone encounter for: per pt do not [facility], [facility] found this script and it has been taken care off.  Send all future script to [facility]  08/31/17.

## 2017-09-11 ENCOUNTER — Encounter: Payer: Self-pay | Admitting: Family Medicine

## 2017-09-13 NOTE — Progress Notes (Signed)
Office Visit Note  Patient: Rachel Duran             Date of Birth: 06/27/1956           MRN: [id number]             PCP: Ria Bush, MD Referring: Ria Bush, MD Visit Date: 09/26/2017 Occupation: @GUAROCC @    Subjective:  Duran in multiple joints    History of Present Illness: Rachel Duran is a 62 y.o. female history of fibromyalgia, DDD, and osteoarthritis.  She continues to have Duran in multiple joints as well as muscle tenderness.  She states she has been having increased Duran due to the frequent weather changes.  States her bilateral trochanteric bursa have been very tender month.  She continues to do stretching exercises at home daily.  She states that the Duran is the worse when she sleeps on her sides at night.  She would like a cortisone injection bilaterally today.  She states that physical therapy in the past helped.  She now does a 30-minute stretching exercise program daily and 3 times a week she does strength training.  She reports Duran in her lower back.  She is going to be scheduled for epidural injection soon.  She went to PT in November and December, which did not help.  She states she experienced extreme Duran with dry needling.  She denies any discomfort in her neck.  She does have limited range of motion.  Ports muscle tension and tenderness in the trapezius region.  She uses a heating pad at night which helps with some of her muscle soreness.  To needs to take Lyrica 150 mg 3 times daily and tramadol as needed. She reports she continues to have symptoms of ray nods in her fingers.  She takes Norvasc 5 mg daily.  She denies any ulcerations on her fingertips.  She reports she wears her gloves often.  She has been experiencing increased stiffness in her bilateral hands and she notices swelling occasionally in her hands.  She states she has developed a left middle trigger finger.  She has to manually extend her finger.  Her symptoms are worse in the morning.  Her bilateral knees continue to cause chronic Duran.  Reports her plantar fasciitis has resolved bilaterally.  Also reports her mouth sores have become a lot less frequent.  She states that she has flares 1-2 times per year and uses Magic mouthwash which resolves the sores.        Activities of Daily Living:  Patient reports morning stiffness for 2 hours.   Patient Reprts nocturnal Duran.  Difficulty dressing/grooming: Denies Difficulty climbing stairs: Reports Difficulty getting out of chair: Denies Difficulty using hands for taps, buttons, cutlery, and/or writing: Reports   Review of Systems  Constitutional: Positive for activity change and weakness. Negative for fever.  HENT: Negative for ear Duran and mouth sores.   Eyes: Negative for Duran.  Respiratory: Negative for cough and shortness of breath.   Cardiovascular: Negative for chest Duran, palpitations and swelling in legs/feet.  Gastrointestinal: Positive for constipation. Negative for blood in stool and diarrhea.  Genitourinary: Positive for difficulty urinating. Negative for hematuria.  Musculoskeletal: Positive for arthralgias, joint Duran, myalgias, muscle weakness, morning stiffness and myalgias.  Skin: Negative for rash, hair loss and sensitivity to sunlight.  Neurological: Positive for headaches. Negative for dizziness, light-headedness and numbness.  Psychiatric/Behavioral: Positive for depressed mood and sleep disturbance.    PMFS History:  Patient  Active Problem List   Diagnosis Date Noted  . Dyslipidemia 09/22/2017  . DDD (degenerative disc disease), cervical 04/03/2017  . DDD (degenerative disc disease), lumbar 04/03/2017  . Onychomycosis 03/21/2017  . Dysphagia 10/18/2016  . Encounter for chronic Duran management 10/18/2016  . Biliary stasis 09/26/2016  . Fever blister 08/18/2016  . Urinary urgency 07/05/2016  . HNP (herniated nucleus pulposus), lumbar 09/23/2015  . Sinus congestion 07/30/2015  . Iron deficiency  07/30/2015  . Health maintenance examination 06/15/2015  . 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 Sinus headache 03/09/2015  . Clavicle enlargement 12/13/2014  . Prediabetes 12/13/2014  . Plantar fasciitis, bilateral 09/11/2014  . Advanced care planning/counseling discussion 06/10/2014  . Medicare annual wellness visit, subsequent 06/10/2014  . Abnormal thyroid function test 06/10/2014  . 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 CFS (chronic fatigue syndrome) 04/08/2014  . Calculus of bile duct 09/17/2013  . Postmenopausal atrophic vaginitis 10/19/2012  . 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 Cervical disc disorder with radiculopathy of cervical region 05/28/2010  . Anemia of chronic disease 03/17/2009  . CHEST Duran UNSPECIFIED 02/28/2008  . APHTHOUS ULCERS 01/31/2008  . INSOMNIA, CHRONIC 11/09/2007  . Drug-induced constipation 10/11/2007  . ALLERGIC RHINITIS 04/12/2007  . MDD (major depressive disorder), recurrent episode, moderate ([place]) 03/05/2007  . [name] syndrome 03/05/2007  . GERD 03/05/2007  . ROSACEA 03/05/2007  . NEURALGIA 03/05/2007  . 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 CYSTITIS, CHRONIC INTERSTITIAL 12/06/2006  . Fibromyalgia 12/06/2006    Past Medical History:  Diagnosis Date  . Abdominal Duran last 4 months   and nausea also  . Allergy   . Anemia   . Anxiety   . 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 C6- C7 -hx. past fusion(bone graft used)  . Cholecystitis   . Chronic Duran   . DDD (degenerative disc disease), lumbar 09/2015   dextroscoliosis with multilevel DDD and facet arthrosis most notable for R foraminal disc protrusion L4/5 producing severe R neural foraminal stenosis abutting R L4 nerve root, moderate spinal canal and mild lat recesss and R neural foraminal stenosis L3/4 (MRI)  . Depression    bipolar depression  . Disorders of porphyrin metabolism   . Felon of finger of left hand 11/10/2016  . Fibromyalgia   .  GERD (gastroesophageal reflux disease)   . Internal hemorrhoids   . Interstitial cystitis 06-06-12   hx.  . Irritable bowel syndrome   . PONV (postoperative nausea and vomiting)    now uses stomach blockers and no ponv  . 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 Raynauds disease    hx.  . Seronegative arthritis    [name]  . 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 Stargardt's disease     Family History  Problem Relation Age of Onset  . CAD Father 61       MI, nonsmoker  . Esophageal cancer Father 92  . Stomach cancer Father   . Scleroderma Mother   . Hypertension Mother   . Esophageal cancer Paternal Grandfather   . Stomach cancer Paternal Grandfather   . Diabetes Maternal Grandmother   . Arthritis Brother   . Stroke Neg Hx   . Colon cancer Neg Hx    Past Surgical History:  Procedure Laterality Date  . ANTERIOR CERVICAL DECOMP/DISCECTOMY FUSION  2004   C5/6, C6/7  . ANTERIOR CERVICAL DECOMP/DISCECTOMY FUSION  02/2016   C3/4, C4/5 with plating [name])  . AUGMENTATION MAMMAPLASTY Bilateral 03/25/2010  . BREAST ENHANCEMENT SURGERY  2010  . BREAST IMPLANT EXCHANGE  10/2014   exchange saline implants, B mastopexy/capsulorraphy ([facility])  . BUNIONECTOMY Bilateral yrs ago  . [place]   x 1  . CHOLECYSTECTOMY  06/11/2012   Procedure: LAPAROSCOPIC CHOLECYSTECTOMY WITH INTRAOPERATIVE CHOLANGIOGRAM;  Surgeon: [name], MD,FACS;  Location: [facility];  Service: General;  Laterality: N/A;  . CYSTOSCOPY    . ERCP  05/22/2012   Procedure: ENDOSCOPIC RETROGRADE CHOLANGIOPANCREATOGRAPHY (ERCP);  Surgeon: [name], MD,FACG;  Location: [facility];  Service: Endoscopy;  Laterality: N/A;  . ERCP N/A 09/17/2013   Procedure: ENDOSCOPIC RETROGRADE  CHOLANGIOPANCREATOGRAPHY (ERCP);  Surgeon: [name], MD;  Location: [facility];  Service: Endoscopy;  Laterality: N/A;  . ESOPHAGOGASTRODUODENOSCOPY  09/2016   WNL. esophagus dilated [place])  . HEMORRHOID BANDING  09-23-13   --Dr. Greer Pickerel  . HERNIA REPAIR     inguinal  . HYSTEROSCOPY W/ ENDOMETRIAL ABLATION    . 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 She appears well-developed and well-nourished.  HENT:  Head: Normocephalic and atraumatic.  Eyes: Conjunctivae and EOM are normal.  Neck: Normal range of motion.  Cardiovascular: Normal rate, regular rhythm, normal heart sounds and intact distal pulses.  Pulmonary/Chest: Effort normal and breath sounds normal.  Abdominal: Soft. Bowel sounds are normal.  Lymphadenopathy:    She has no cervical adenopathy.  Neurological: She is alert and oriented to person, place, and time.  Skin: Skin is warm and dry. Capillary refill takes less than 2 seconds.  Psychiatric: She has a normal mood and affect. Her behavior is normal.  Nursing note and vitals reviewed.    Musculoskeletal Exam: C-spine limited ROM.  Thoracic and lumbar spine good ROM.  She has midline spinal tenderness in the lumbar region and bilateral SI joints.  Shoulder joints, elbow joints, wrist joints, MCPs, PIPs, and DIPs good ROM with no synovitis.  She has  PIP and DIP synovial thickening consistent with osteoarthritis.  Hip joints, knee joints, ankle joints, MTPs, PIPs, and DIPs good ROM with no synovitis.  She has bilateral trochanteric bursa tenderness. She has trapezius muscle tenderness noted bilaterally.   CDAI Exam: No CDAI exam completed.    Investigation: No additional findings. CBC Latest Ref Rng & Units 09/13/2016 06/14/2016 07/16/2015  WBC 4.0 - 10.5 K/uL 9.4 12.1(H) 7.9  Hemoglobin 12.0 - 15.0 g/dL 13.4 12.6 12.0  Hematocrit 36.0 - 46.0 % 40.9 38.4 37.4  Platelets 150.0 - 400.0  K/uL 298.0 323.0 275.0   CMP Latest Ref Rng & Units 09/21/2017 06/21/2017 03/21/2017  Glucose 70 - 99 mg/dL 88 117(H) -  BUN 6 - 23 mg/dL 8 11 -  Creatinine 0.40 - 1.20 mg/dL 0.59 0.59 -  Sodium 135 - 145 mEq/L 139 139 -  Potassium 3.5 - 5.1 mEq/L 5.0 4.4 -  Chloride 96 - 112 mEq/L 102 103 -  CO2 19 - 32 mEq/L 25 31 -  Calcium 8.4 - 10.5 mg/dL 9.8 9.5 -  Total Protein 6.0 - 8.3 g/dL - - 6.8  Total Bilirubin 0.2 - 1.2 mg/dL - - 0.3  Alkaline Phos 39 - 117 U/L - - 81  AST 0 - 37 U/L - - 29  ALT 0 - 35 U/L - - 31    Imaging: No results found.  Speciality Comments: No specialty comments available.    Procedures:  Large Joint Inj: bilateral greater trochanter on 09/26/2017 11:52 AM Indications: Duran Details: 27 G 1.5 in needle, lateral approach  Arthrogram: No  Medications (Right): 1.5 mL lidocaine 1 %; 40 mg triamcinolone acetonide 40 MG/ML Aspirate (Right): 0 mL Medications (Left): 1.5 mL lidocaine 1 %; 40 mg triamcinolone acetonide 40 MG/ML Aspirate (Left): 0 mL Outcome: tolerated well, no immediate complications Procedure, treatment alternatives, risks and benefits explained, specific risks discussed. Consent was given by the patient. Immediately prior to procedure a time out was called to verify the correct patient, procedure, equipment, support staff and site/side marked as required. Patient was prepped and draped in the usual sterile  fashion.     Allergies: Codeine phosphate; Cymbalta [duloxetine hcl]; Celebrex [celecoxib]; Inh [isoniazid]; Lithium; Nucynta [tapentadol]; Silenor [doxepin hcl]; Meperidine hcl; and Sulfamethoxazole   Assessment / Plan:     Visit Diagnoses: Fibromyalgia -She continues to have generalized muscle tenderness and tension.  She is tender in the bilateral trapezius region.  She is also having bilateral trochanteric bursitis.  She attended physical therapy in the past, which helped.  The Duran is worse at night when laying in her side.  She requested bilateral cortisone injections today.  The Duran has been keeping her up at night due to having to make positional changes.  She is doing a stretching program that is 30 minutes daily and also a strengthening program 3 times a week which has been helping with her discomfort. She is going to start trying beginners yoga and walking. She is taking Lyrica 150 mg TID and  Tramadol PRN.    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   CFS (chronic fatigue syndrome): Her fatigue has worsened due to not sleeping well at night due to bilateral trochanteric bursitis.  Discussed good sleep hygiene and exercising regularly.   DDD (degenerative disc disease), cervical - s/p fusion: She has limited ROM on exam.  She has no discomfort at this time. She has trapezius muscle tension and tenderness on exam. She performs stretching exercises at home.   DDD (degenerative disc disease), lumbar: Chronic Duran. She has midline spinal tenderness in the lumbar region.  She is going to be scheduled soon for a epidural injection.  She has recently got a new mattress topper to see if it will help her back Duran and trochanteric bursitis.   Primary osteoarthritis of both knees - Moderate chondromalacia patella: She has bilateral knee crepitus.  No warmth or effusion.  She experiences discomfort in her bilateral knees.  She has been to strengthen and stretch more frequently. She was given a  refill  of Voltaren gel.    [name] disease without gangrene: No ulcerations or signs of gangrene are noted.  She wears gloves frequently.  She was advised to keep her core body temperature warm.  She was given a refill of Norvasc 5 mg daily.    Trochanteric bursitis of both hips: She has tenderness of bilateral trochanteric bursa.  She performs stretching exercises regularly.  She had bilateral trochanteric bursa cortisone injections.  She tolerated the procedure well.  She can use voltaren gel in this region as well.    Trigger finger, left middle finger: She has a left middle trigger finger.  She was advised to use ice and voltaren gel.  If she continues to have locking, she can schedule an ultrasound guided cortisone injection with Dr. Estanislado Pandy. We discussed the treatment options.   Plantar fasciitis, bilateral: Resolved   H/O oral aphthous ulcers: Her oral ulcers have been occurring a lot less frequent.  She uses magic mouthwash if she has a flare of ulcers.   Positive QuantiFERON-TB Gold test - [place] with [facility] in 2013  Prediabetes: She was advised to monitor her blood glucose and blood pressure following the cortisone injection.   Other medical conditions are listed as follows:   MDD (major depressive disorder), recurrent episode, moderate ([facility])  History of gastroesophageal reflux (GERD)  Hx of porphyria  History of bipolar disorder  History of cystitis      Orders: Orders Placed This Encounter  Procedures  . Large Joint Inj   Meds ordered this encounter  Medications  . amLODipine (NORVASC) 5 MG tablet    Sig: Take 1 tablet (5 mg total) by mouth daily.    Dispense:  90 tablet    Refill:  3  . diclofenac sodium (VOLTAREN) 1 % GEL    Sig: Apply three grams to three large joints up to three times a day    Dispense:  3 Tube    Refill:  3    Face-to-face time spent with patient was 30 minutes. >50% of time was spent in counseling and coordination of care.  Follow-Up  Instructions: Return in about 6 months (around 03/29/2018) for Fibromyalgia, Osteoarthritis, DDD.   Ofilia Neas, PA-C  Note - This record has been created using Dragon software.  Chart creation errors have been sought, but may not always  have been located. Such creation errors do not reflect on  the standard of medical care.

## 2017-09-14 NOTE — Telephone Encounter (Signed)
Pt states b12 injectable solution is no longer covered by her insurance. Can we check on this and if needed start PA process? Thanks

## 2017-09-15 ENCOUNTER — Telehealth: Payer: Self-pay

## 2017-09-15 MED ORDER — CYANOCOBALAMIN 1000 MCG/ML IJ SOLN: INTRAMUSCULAR | 1 refills | Status: DC

## 2017-09-15 NOTE — Telephone Encounter (Signed)
Rx was sent to [facility].

## 2017-09-17 ENCOUNTER — Other Ambulatory Visit: Payer: Self-pay | Admitting: Family Medicine

## 2017-09-19 ENCOUNTER — Other Ambulatory Visit: Payer: Self-pay | Admitting: Family Medicine

## 2017-09-19 DIAGNOSIS — Z13220 Encounter for screening for lipoid disorders: Secondary | ICD-10-CM

## 2017-09-20 ENCOUNTER — Other Ambulatory Visit (INDEPENDENT_AMBULATORY_CARE_PROVIDER_SITE_OTHER): Payer: Medicare Other

## 2017-09-20 DIAGNOSIS — Z13220 Encounter for screening for lipoid disorders: Secondary | ICD-10-CM

## 2017-09-20 LAB — LIPID PANEL
CHOLESTEROL: 216 mg/dL — AB (ref 0–200)
HDL: 64 mg/dL (ref >39.00)
LDL Cholesterol: 120 mg/dL — ABNORMAL HIGH (ref 0–99)
NonHDL: 151.57
TRIGLYCERIDES: 156 mg/dL — AB (ref 0.0–149.0)
Total CHOL/HDL Ratio: 3
VLDL: 31.2 mg/dL (ref 0.0–40.0)

## 2017-09-21 ENCOUNTER — Other Ambulatory Visit (INDEPENDENT_AMBULATORY_CARE_PROVIDER_SITE_OTHER): Payer: Medicare Other

## 2017-09-21 DIAGNOSIS — R7303 Prediabetes: Secondary | ICD-10-CM | POA: Diagnosis not present

## 2017-09-21 LAB — BASIC METABOLIC PANEL
BUN: 8 mg/dL (ref 6–23)
CO2: 25 mEq/L (ref 19–32)
CREATININE: 0.59 mg/dL (ref 0.40–1.20)
Calcium: 9.8 mg/dL (ref 8.4–10.5)
Chloride: 102 mEq/L (ref 96–112)
GFR: 110.63 mL/min (ref >60.00)
Glucose, Bld: 88 mg/dL (ref 70–99)
Potassium: 5 mEq/L (ref 3.5–5.1)
Sodium: 139 mEq/L (ref 135–145)

## 2017-09-22 ENCOUNTER — Ambulatory Visit (INDEPENDENT_AMBULATORY_CARE_PROVIDER_SITE_OTHER): Payer: Medicare Other | Admitting: Family Medicine

## 2017-09-22 ENCOUNTER — Encounter: Payer: Self-pay | Admitting: Family Medicine

## 2017-09-22 VITALS — BP 120/78 | HR 91 | Temp 98.1°F | Wt 134.0 lb

## 2017-09-22 DIAGNOSIS — G8929 Other chronic pain: Secondary | ICD-10-CM | POA: Diagnosis not present

## 2017-09-22 DIAGNOSIS — G894 Chronic pain syndrome: Secondary | ICD-10-CM | POA: Diagnosis not present

## 2017-09-22 DIAGNOSIS — E785 Hyperlipidemia, unspecified: Secondary | ICD-10-CM | POA: Diagnosis not present

## 2017-09-22 DIAGNOSIS — M5136 Other intervertebral disc degeneration, lumbar region: Secondary | ICD-10-CM

## 2017-09-22 DIAGNOSIS — M5010 Cervical disc disorder with radiculopathy, unspecified cervical region: Secondary | ICD-10-CM

## 2017-09-22 DIAGNOSIS — M5030 Other cervical disc degeneration, unspecified cervical region: Secondary | ICD-10-CM

## 2017-09-22 DIAGNOSIS — M797 Fibromyalgia: Secondary | ICD-10-CM | POA: Diagnosis not present

## 2017-09-22 DIAGNOSIS — R0981 Nasal congestion: Secondary | ICD-10-CM

## 2017-09-22 MED ORDER — MORPHINE-NALTREXONE 80-3.2 MG PO CPCR: 1.0000 | ORAL_CAPSULE | Freq: Every day | ORAL | 0 refills | Status: DC

## 2017-09-22 NOTE — Patient Instructions (Addendum)
You are doing well today.  Cholesterol levels remain a bit elevated but I'm confident we can manage with healthy diet. Sugar was better. Continue current medicines Continue humidifier use for nasal dryness and sinus headaches.

## 2017-09-22 NOTE — Assessment & Plan Note (Addendum)
[place] CSRS reviewed and appropriate.  Reviewed embeda and tramadol use.  Tolerating well, appropriate to continue.

## 2017-09-22 NOTE — Assessment & Plan Note (Addendum)
Chronic, mild off medication. Reviewed ASCVD risk with patient, encouraged ongoing efforts at healthy diet.  The 10-year ASCVD risk score Mikey Bussing DC Brooke Bonito., et al., 2013) is: 4.8%   Values used to calculate the score:     Age: 62 years     Sex: Female     Is Non-Hispanic African American: No     Diabetic: Yes     Tobacco smoker: No     Systolic Blood Pressure: 497 mmHg     Is BP treated: No     HDL Cholesterol: 64 mg/dL     Total Cholesterol: 216 mg/dL

## 2017-09-22 NOTE — Progress Notes (Signed)
BP 120/78 (BP Location: Left Arm, Patient Position: Sitting, Cuff Size: Normal)   Pulse 91   Temp 98.1 F (36.7 C) (Oral)   Wt 134 lb (60.8 kg)   LMP 07/25/1998   SpO2 96%   BMI 23.00 kg/m    CC: 3 mo f/u vist Subjective:    Patient ID: Rachel Duran, female    DOB: [birth year], 62 y.o.   MRN: [id number]  HPI: Rachel Duran is a 62 y.o. female presenting on 09/22/2017 for 3 mo follow-up   Chronic pain stemming from known cervical and lumbar DDD. Current regimen is embeda 80/3.2mg  daily #90 with tramadol #40 PRN. Tolerates medication well.   HLD and prediabetes - takes ursodiol 300mg  BID.   Ongoing sinus headaches, daily over the last 2 weeks, saw ENT 08/2017. Treats with aspirin 325mg  2 tab at a time. Ongoing flonase use.  Just had skin lesion L cheek biopsied by derm yesterday.   Relevant past medical, surgical, family and social history reviewed and updated as indicated. Interim medical history since our last visit reviewed. Allergies and medications reviewed and updated. Outpatient Medications Prior to Visit  Medication Sig Dispense Refill  . acyclovir (ZOVIRAX) 800 MG tablet TAKE ONE-HALF TABLET BY  MOUTH EVERY DAY 45 tablet 1  . ALPRAZolam (XANAX) 1 MG tablet Take 1 tablet (1 mg total) by mouth 3 (three) times daily as needed for anxiety. With 1/2 tab at night time  0  . amLODipine (NORVASC) 5 MG tablet TAKE 1 TABLET BY MOUTH  DAILY 90 tablet 3  . amphetamine-dextroamphetamine (ADDERALL XR) 30 MG 24 hr capsule Take 1 capsule (30 mg total) by mouth every morning. 90 capsule 0  . ARIPiprazole (ABILIFY) 5 MG tablet Take 5 mg by mouth daily.     Marland Kitchen aspirin EC 81 MG tablet Take 81 mg by mouth every other day.     . Calcium-Magnesium-Vitamin D 409-81-191 MG-MG-UNIT TB24 Take 1 tablet by mouth 2 (two) times daily.    . cyanocobalamin (,VITAMIN B-12,) 1000 MCG/ML injection INJECT INTRAMUSCULARLY 1ML  (1000MCG) EVERY 21 DAYS  (DISCARD 28 DAYS AFTER  FIRST USE.) 3 mL 1  .  diclofenac sodium (VOLTAREN) 1 % GEL Voltaren Gel 3 grams to 3 large joints upto TID 3 TUBES with 3 refills 3 Tube 3  . esomeprazole (NEXIUM) 40 MG capsule TAKE 1 CAPSULE BY MOUTH TWICE DAILY BEFORE MEALS 180 capsule 1  . Eszopiclone 3 MG TABS TAKE ONE TABLET BY MOUTH AT BEDTIME 90 tablet 0  . ferrous sulfate 325 (65 FE) MG tablet Take 1 tablet (325 mg total) by mouth once a week.    . fluticasone (FLONASE) 50 MCG/ACT nasal spray USE 2 SPRAYS IN BOTH NOSTRILS DAILY 16 g 5  . lamoTRIgine (LAMICTAL) 200 MG tablet Take 200 mg by mouth daily.    Marland Kitchen linaclotide (LINZESS) 290 MCG CAPS capsule Take 1 capsule (290 mcg total) by mouth daily. 90 capsule 3  . magic mouthwash w/lidocaine SOLN Take 5 mLs by mouth 3 (three) times daily as needed for mouth pain. Benadryl/viscous lidocaine 2%/maalox 120 mL 0  . MAGNESIUM PO Take 1,000 mg by mouth 3 (three) times daily.    . Manganese 50 MG TABS Take 50 mg by mouth daily.     . Multiple Vitamins-Minerals (ICAPS AREDS 2) CAPS Take 1 capsule by mouth daily.    . NONFORMULARY OR COMPOUNDED ITEM Testosterone propionate 2% in white petrolatum, apply small amount once daily for 5 days per  week. 60 each 1  . ondansetron (ZOFRAN) 4 MG tablet TAKE 1 TABLET BY MOUTH EVERY 8 HOURS AS NEEDED FOR NAUSEA OR VOMITING 30 tablet 0  . pentosan polysulfate (ELMIRON) 100 MG capsule Take 100 mg by mouth 3 (three) times daily.    . phenazopyridine (PYRIDIUM) 100 MG tablet Take 1 tablet (100 mg total) by mouth 3 (three) times daily as needed for pain. 20 tablet 0  . Polyethylene Glycol 3350 (MIRALAX PO) Take 17 g by mouth 4 (four) times daily.     . pregabalin (LYRICA) 150 MG capsule Take 1 capsule (150 mg total) by mouth 3 (three) times daily. 270 capsule 0  . traMADol (ULTRAM) 50 MG tablet Take 1 tablet (50 mg total) by mouth 2 (two) times daily as needed. 40 tablet 0  . ursodiol (ACTIGALL) 300 MG capsule TAKE 1 CAPSULE TWICE DAILY 60 capsule 0  . YUVAFEM 10 MCG TABS vaginal tablet  Place 1 tablet (10 mcg total) vaginally 3 (three) times a week. 36 tablet 3  . ALPRAZolam (XANAX) 1 MG tablet Take 1 tablet (1 mg total) by mouth 3 (three) times daily as needed for anxiety. With 1/2 tab at night time 60 tablet 0  . Morphine-Naltrexone 80-3.2 MG CPCR Take 1 tablet by mouth daily. 30 capsule 0  . ciclopirox (PENLAC) 8 % solution      No facility-administered medications prior to visit.      Per HPI unless specifically indicated in ROS section below Review of Systems     Objective:    BP 120/78 (BP Location: Left Arm, Patient Position: Sitting, Cuff Size: Normal)   Pulse 91   Temp 98.1 F (36.7 C) (Oral)   Wt 134 lb (60.8 kg)   LMP 07/25/1998   SpO2 96%   BMI 23.00 kg/m   Wt Readings from Last 3 Encounters:  09/22/17 134 lb (60.8 kg)  08/02/17 131 lb (59.4 kg)  06/23/17 129 lb (58.5 kg)    Physical Exam  Constitutional: She appears well-developed and well-nourished. No distress.  Psychiatric: She has a normal mood and affect.  Nursing note and vitals reviewed.  Results for orders placed or performed in visit on 27/06/23  Basic metabolic panel  Result Value Ref Range   Sodium 139 135 - 145 mEq/L   Potassium 5.0 3.5 - 5.1 mEq/L   Chloride 102 96 - 112 mEq/L   CO2 25 19 - 32 mEq/L   Glucose, Bld 88 70 - 99 mg/dL   BUN 8 6 - 23 mg/dL   Creatinine, Ser 0.59 0.40 - 1.20 mg/dL   Calcium 9.8 8.4 - 10.5 mg/dL   GFR 110.63 >60.00 mL/min      Assessment & Plan:   Problem List Items Addressed This Visit    Cervical disc disorder with radiculopathy of cervical region   Chronic pain syndrome   DDD (degenerative disc disease), cervical   Relevant Medications   Morphine-Naltrexone 80-3.2 MG CPCR   DDD (degenerative disc disease), lumbar   Relevant Medications   Morphine-Naltrexone 80-3.2 MG CPCR   Dyslipidemia    Chronic, mild off medication. Reviewed ASCVD risk with patient, encouraged ongoing efforts at healthy diet.  The 10-year ASCVD risk score Mikey Bussing DC  Jr., et al., 2013) is: 4.8%   Values used to calculate the score:     Age: 59 years     Sex: Female     Is Non-Hispanic African American: No     Diabetic: Yes  Tobacco smoker: No     Systolic Blood Pressure: 978 mmHg     Is BP treated: No     HDL Cholesterol: 64 mg/dL     Total Cholesterol: 216 mg/dL       Encounter for chronic pain management - Primary    [place] CSRS reviewed and appropriate.  Reviewed embeda and tramadol use.  Tolerating well, appropriate to continue.      Fibromyalgia   Sinus congestion    Ongoing, chronic after multiple surgeries. Predominant concern is ongoing nasal dryness despite regular nasal saline and flonase. Saw ENT s/p another steroid course. Reviewed judicious use of decongestant, rec use humidifier.           Meds ordered this encounter  Medications  . Morphine-Naltrexone 80-3.2 MG CPCR    Sig: Take 1 tablet by mouth daily.    Dispense:  30 capsule    Refill:  0    Fill after 09/28/2017   No orders of the defined types were placed in this encounter.   Follow up plan: No Follow-up on file.  Ria Bush, MD

## 2017-09-22 NOTE — Assessment & Plan Note (Signed)
Ongoing, chronic after multiple surgeries. Predominant concern is ongoing nasal dryness despite regular nasal saline and flonase. Saw ENT s/p another steroid course. Reviewed judicious use of decongestant, rec use humidifier.

## 2017-09-25 ENCOUNTER — Other Ambulatory Visit: Payer: Self-pay | Admitting: Obstetrics and Gynecology

## 2017-09-25 DIAGNOSIS — Z1231 Encounter for screening mammogram for malignant neoplasm of breast: Secondary | ICD-10-CM

## 2017-09-26 ENCOUNTER — Encounter: Payer: Self-pay | Admitting: Physician Assistant

## 2017-09-26 ENCOUNTER — Ambulatory Visit (INDEPENDENT_AMBULATORY_CARE_PROVIDER_SITE_OTHER): Payer: Medicare Other | Admitting: Physician Assistant

## 2017-09-26 VITALS — BP 126/78 | HR 83 | Resp 16 | Ht 65.0 in | Wt 138.0 lb

## 2017-09-26 DIAGNOSIS — M7061 Trochanteric bursitis, right hip: Secondary | ICD-10-CM | POA: Diagnosis not present

## 2017-09-26 DIAGNOSIS — M170 Bilateral primary osteoarthritis of knee: Secondary | ICD-10-CM | POA: Diagnosis not present

## 2017-09-26 DIAGNOSIS — Z87440 Personal history of urinary (tract) infections: Secondary | ICD-10-CM

## 2017-09-26 DIAGNOSIS — I7300 Raynaud's syndrome without gangrene: Secondary | ICD-10-CM | POA: Diagnosis not present

## 2017-09-26 DIAGNOSIS — R7303 Prediabetes: Secondary | ICD-10-CM | POA: Diagnosis not present

## 2017-09-26 DIAGNOSIS — G894 Chronic pain syndrome: Secondary | ICD-10-CM

## 2017-09-26 DIAGNOSIS — M5136 Other intervertebral disc degeneration, lumbar region: Secondary | ICD-10-CM | POA: Diagnosis not present

## 2017-09-26 DIAGNOSIS — Z8719 Personal history of other diseases of the digestive system: Secondary | ICD-10-CM

## 2017-09-26 DIAGNOSIS — M7062 Trochanteric bursitis, left hip: Secondary | ICD-10-CM | POA: Diagnosis not present

## 2017-09-26 DIAGNOSIS — M722 Plantar fascial fibromatosis: Secondary | ICD-10-CM | POA: Diagnosis not present

## 2017-09-26 DIAGNOSIS — M797 Fibromyalgia: Secondary | ICD-10-CM

## 2017-09-26 DIAGNOSIS — M51369 Other intervertebral disc degeneration, lumbar region without mention of lumbar back pain or lower extremity pain: Secondary | ICD-10-CM

## 2017-09-26 DIAGNOSIS — M5030 Other cervical disc degeneration, unspecified cervical region: Secondary | ICD-10-CM

## 2017-09-26 DIAGNOSIS — R5382 Chronic fatigue, unspecified: Secondary | ICD-10-CM

## 2017-09-26 DIAGNOSIS — G9332 Myalgic encephalomyelitis/chronic fatigue syndrome: Secondary | ICD-10-CM

## 2017-09-26 DIAGNOSIS — Z8659 Personal history of other mental and behavioral disorders: Secondary | ICD-10-CM

## 2017-09-26 DIAGNOSIS — Z8639 Personal history of other endocrine, nutritional and metabolic disease: Secondary | ICD-10-CM

## 2017-09-26 DIAGNOSIS — F331 Major depressive disorder, recurrent, moderate: Secondary | ICD-10-CM

## 2017-09-26 DIAGNOSIS — M65332 Trigger finger, left middle finger: Secondary | ICD-10-CM

## 2017-09-26 DIAGNOSIS — R7612 Nonspecific reaction to cell mediated immunity measurement of gamma interferon antigen response without active tuberculosis: Secondary | ICD-10-CM | POA: Diagnosis not present

## 2017-09-26 MED ORDER — TRIAMCINOLONE ACETONIDE 40 MG/ML IJ SUSP
40.0000 mg | INTRAMUSCULAR | Status: AC | PRN
  Administered 2017-09-26: 40 mg via INTRA_ARTICULAR

## 2017-09-26 MED ORDER — DICLOFENAC SODIUM 1 % TD GEL: TRANSDERMAL | 3 refills | Status: DC

## 2017-09-26 MED ORDER — LIDOCAINE HCL 1 % IJ SOLN
1.5000 mL | INTRAMUSCULAR | Status: AC | PRN
  Administered 2017-09-26: 1.5 mL

## 2017-09-26 MED ORDER — AMLODIPINE BESYLATE 5 MG PO TABS: 5.0000 mg | ORAL_TABLET | Freq: Every day | ORAL | 3 refills | Status: DC

## 2017-09-26 NOTE — Patient Instructions (Signed)
Hand Exercises Hand exercises can be helpful to almost anyone. These exercises can strengthen the hands, improve flexibility and movement, and increase blood flow to the hands. These results can make work and daily tasks easier. Hand exercises can be especially helpful for people who have joint pain from arthritis or have nerve damage from overuse (carpal tunnel syndrome). These exercises can also help people who have injured a hand. Most of these hand exercises are fairly gentle stretching routines. You can do them often throughout the day. Still, it is a good idea to ask your health care provider which exercises would be best for you. Warming your hands before exercise may help to reduce stiffness. You can do this with gentle massage or by placing your hands in warm water for 15 minutes. Also, make sure you pay attention to your level of hand pain as you begin an exercise routine. Exercises Knuckle Bend Repeat this exercise 5-10 times with each hand. 1. Stand or sit with your arm, hand, and all five fingers pointed straight up. Make sure your wrist is straight. 2. Gently and slowly bend your fingers down and inward until the tips of your fingers are touching the tops of your palm. 3. Hold this position for a few seconds. 4. Extend your fingers out to their original position, all pointing straight up again.  Finger Fan Repeat this exercise 5-10 times with each hand. 1. Hold your arm and hand out in front of you. Keep your wrist straight. 2. Squeeze your hand into a fist. 3. Hold this position for a few seconds. 4. Fan out, or spread apart, your hand and fingers as much as possible, stretching every joint fully.  Tabletop Repeat this exercise 5-10 times with each hand. 1. Stand or sit with your arm, hand, and all five fingers pointed straight up. Make sure your wrist is straight. 2. Gently and slowly bend your fingers at the knuckles where they meet the hand until your hand is making an  upside-down L shape. Your fingers should form a tabletop. 3. Hold this position for a few seconds. 4. Extend your fingers out to their original position, all pointing straight up again.  Making Os Repeat this exercise 5-10 times with each hand. 1. Stand or sit with your arm, hand, and all five fingers pointed straight up. Make sure your wrist is straight. 2. Make an O shape by touching your pointer finger to your thumb. Hold for a few seconds. Then open your hand wide. 3. Repeat this motion with each finger on your hand.  Table Spread Repeat this exercise 5-10 times with each hand. 1. Place your hand on a table with your palm facing down. Make sure your wrist is straight. 2. Spread your fingers out as much as possible. Hold this position for a few seconds. 3. Slide your fingers back together again. Hold for a few seconds.  Ball Grip  Repeat this exercise 10-15 times with each hand. 1. Hold a tennis ball or another soft ball in your hand. 2. While slowly increasing pressure, squeeze the ball as hard as possible. 3. Squeeze as hard as you can for 3-5 seconds. 4. Relax and repeat.  Wrist Curls Repeat this exercise 10-15 times with each hand. 1. Sit in a chair that has armrests. 2. Hold a light weight in your hand, such as a dumbbell that weighs 1-3 pounds (0.5-1.4 kg). Ask your health care provider what weight would be best for you. 3. Rest your hand just over   the end of the chair arm with your palm facing up. 4. Gently pivot your wrist up and down while holding the weight. Do not twist your wrist from side to side.  Contact a health care provider if:  Your hand pain or discomfort gets much worse when you do an exercise.  Your hand pain or discomfort does not improve within 2 hours after you exercise. If you have any of these problems, stop doing these exercises right away. Do not do them again unless your health care provider says that you can. Get help right away if:  You  develop sudden, severe hand pain. If this happens, stop doing these exercises right away. Do not do them again unless your health care provider says that you can. This information is not intended to replace advice given to you by your health care provider. Make sure you discuss any questions you have with your health care provider. Document Released: 06/22/2015 Document Revised: 12/17/2015 Document Reviewed: 01/19/2015 Elsevier Interactive Patient Education  2018 Elsevier Inc.  

## 2017-10-05 ENCOUNTER — Ambulatory Visit (INDEPENDENT_AMBULATORY_CARE_PROVIDER_SITE_OTHER): Payer: Self-pay | Admitting: *Deleted

## 2017-10-05 DIAGNOSIS — I781 Nevus, non-neoplastic: Secondary | ICD-10-CM

## 2017-10-05 NOTE — Progress Notes (Signed)
X=.3% Sotradecol administered with a 27g butterfly.  Patient received a total of 10cc.  Cutaneous Laser:pulsed mode  810j/cm2 400 ms delay  13 ms Duration 0.5 spot  Total pulses: 811 Total energy 1.283  Total time::11  Photos: Yes.    Compression stockings applied: Yes.     Treated a combo of retics and spiders. Easy access. Tol well. Antic good results. Follow prn.

## 2017-10-06 ENCOUNTER — Ambulatory Visit: Payer: Medicare Other | Admitting: Rheumatology

## 2017-10-06 ENCOUNTER — Encounter: Payer: Self-pay | Admitting: Vascular Surgery

## 2017-10-18 ENCOUNTER — Other Ambulatory Visit: Payer: Self-pay | Admitting: Gastroenterology

## 2017-10-18 ENCOUNTER — Other Ambulatory Visit: Payer: Self-pay | Admitting: Family Medicine

## 2017-10-19 ENCOUNTER — Encounter: Payer: Self-pay | Admitting: Family Medicine

## 2017-10-19 NOTE — Telephone Encounter (Signed)
Last filled:  07/11/17, #90 Last OV:  09/22/17 Next OV:  12/27/17

## 2017-10-20 NOTE — Telephone Encounter (Signed)
Eprescribed.

## 2017-10-27 ENCOUNTER — Ambulatory Visit
Admission: RE | Admit: 2017-10-27 | Discharge: 2017-10-27 | Disposition: A | Discharge status: Home / Self Care | Payer: Medicare Other | Source: Ambulatory Visit | Attending: Obstetrics and Gynecology | Admitting: Obstetrics and Gynecology

## 2017-10-27 DIAGNOSIS — Z1231 Encounter for screening mammogram for malignant neoplasm of breast: Secondary | ICD-10-CM

## 2017-10-31 ENCOUNTER — Encounter: Payer: Self-pay | Admitting: Family Medicine

## 2017-10-31 ENCOUNTER — Other Ambulatory Visit: Payer: Self-pay

## 2017-10-31 NOTE — Telephone Encounter (Signed)
See refill request.

## 2017-10-31 NOTE — Telephone Encounter (Signed)
Last rx:  09/22/17, #30 Last OV:  09/22/17 Next OV:  12/27/17

## 2017-11-01 MED ORDER — MORPHINE-NALTREXONE 80-3.2 MG PO CPCR: 1.0000 | ORAL_CAPSULE | Freq: Every day | ORAL | 0 refills | Status: DC

## 2017-11-01 NOTE — Telephone Encounter (Signed)
E perscribed 

## 2017-11-02 MED ORDER — MORPHINE-NALTREXONE 80-3.2 MG PO CPCR: 1.0000 | ORAL_CAPSULE | Freq: Every day | ORAL | 0 refills | Status: DC

## 2017-11-17 ENCOUNTER — Encounter: Payer: Self-pay | Admitting: Family Medicine

## 2017-11-17 ENCOUNTER — Other Ambulatory Visit: Payer: Self-pay | Admitting: Gastroenterology

## 2017-11-17 DIAGNOSIS — Z9189 Other specified personal risk factors, not elsewhere classified: Principal | ICD-10-CM

## 2017-11-17 DIAGNOSIS — Z2839 Other underimmunization status: Secondary | ICD-10-CM

## 2017-11-22 ENCOUNTER — Ambulatory Visit: Payer: Medicare Other | Admitting: *Deleted

## 2017-11-22 ENCOUNTER — Other Ambulatory Visit (INDEPENDENT_AMBULATORY_CARE_PROVIDER_SITE_OTHER): Payer: Medicare Other

## 2017-11-22 DIAGNOSIS — Z9189 Other specified personal risk factors, not elsewhere classified: Secondary | ICD-10-CM

## 2017-11-22 DIAGNOSIS — Z2839 Other underimmunization status: Secondary | ICD-10-CM

## 2017-11-23 ENCOUNTER — Telehealth: Payer: Self-pay

## 2017-11-23 ENCOUNTER — Other Ambulatory Visit: Payer: Self-pay | Admitting: Gastroenterology

## 2017-11-23 LAB — RUBEOLA ANTIBODY IGG: Rubeola IgG: <25 AU/mL — ABNORMAL LOW

## 2017-11-23 NOTE — Telephone Encounter (Signed)
Attempted to contact pt. No answer. Call disconnected.  Need to relay Dr. Synthia Innocent message:  Please call and schedule nurse visit for MMR.  [See 11/22/17 lab result note.]

## 2017-11-26 ENCOUNTER — Other Ambulatory Visit: Payer: Self-pay | Admitting: Rheumatology

## 2017-11-27 NOTE — Telephone Encounter (Signed)
Last Visit: 09/26/17 Next Visit: 03/29/18  Okay to refill Lyrica?

## 2017-11-28 ENCOUNTER — Ambulatory Visit (INDEPENDENT_AMBULATORY_CARE_PROVIDER_SITE_OTHER): Payer: Medicare Other

## 2017-11-28 DIAGNOSIS — Z23 Encounter for immunization: Secondary | ICD-10-CM

## 2017-11-29 ENCOUNTER — Telehealth: Payer: Self-pay

## 2017-11-29 MED ORDER — MORPHINE-NALTREXONE 80-3.2 MG PO CPCR: 1.0000 | ORAL_CAPSULE | Freq: Every day | ORAL | 0 refills | Status: DC

## 2017-11-29 NOTE — Telephone Encounter (Signed)
Sent. Thanks.  Can pick up next Tuesday.

## 2017-11-29 NOTE — Telephone Encounter (Signed)
Copied from [place] [id number]. Topic: Quick Communication - Rx Refill/Question >> Nov 29, 2017  2:29 PM Rachel Duran wrote: Medication: Morphine-Naltrexone 80-3.2 MG CPCR [[id number]] - pt will be going out of town next week and needs to be able to pick up this med by next wed.  She is concerned that Dr Darnell Level is not in office?  She said that if she doent get meds by wed she will on in to withdrawals  Has the patient contacted their pharmacy? No  (Agent: If no, request that the patient contact the pharmacy for the refill.) Preferred Pharmacy (with phone number or street name): [facility]  Agent: Please be advised that RX refills may take up to 3 business days. We ask that you follow-up with your pharmacy.

## 2017-11-30 NOTE — Telephone Encounter (Signed)
Patient advised.

## 2017-12-12 ENCOUNTER — Other Ambulatory Visit: Payer: Self-pay | Admitting: Family Medicine

## 2017-12-12 ENCOUNTER — Encounter: Payer: Self-pay | Admitting: Family Medicine

## 2017-12-12 ENCOUNTER — Other Ambulatory Visit: Payer: Self-pay

## 2017-12-12 NOTE — Telephone Encounter (Signed)
Received email from pt requesting 90-day Adderall refill be sent to Rachel Duran.  No longer uses [facility].  Last rx:  08/30/17, #90 Last OV:  09/22/17 Next OV:  12/27/17

## 2017-12-14 MED ORDER — AMPHETAMINE-DEXTROAMPHET ER 30 MG PO CP24: 30.0000 mg | ORAL_CAPSULE | ORAL | 0 refills | Status: DC

## 2017-12-14 NOTE — Telephone Encounter (Signed)
Eprescribed.

## 2017-12-25 ENCOUNTER — Other Ambulatory Visit: Payer: Self-pay | Admitting: Gastroenterology

## 2017-12-27 ENCOUNTER — Encounter: Payer: Self-pay | Admitting: Family Medicine

## 2017-12-27 ENCOUNTER — Ambulatory Visit (INDEPENDENT_AMBULATORY_CARE_PROVIDER_SITE_OTHER): Payer: Medicare Other | Admitting: Family Medicine

## 2017-12-27 VITALS — BP 120/82 | HR 95 | Temp 98.7°F | Ht 64.0 in | Wt 133.8 lb

## 2017-12-27 DIAGNOSIS — M797 Fibromyalgia: Secondary | ICD-10-CM | POA: Diagnosis not present

## 2017-12-27 DIAGNOSIS — M5030 Other cervical disc degeneration, unspecified cervical region: Secondary | ICD-10-CM | POA: Diagnosis not present

## 2017-12-27 DIAGNOSIS — M5126 Other intervertebral disc displacement, lumbar region: Secondary | ICD-10-CM

## 2017-12-27 DIAGNOSIS — M5136 Other intervertebral disc degeneration, lumbar region: Secondary | ICD-10-CM | POA: Diagnosis not present

## 2017-12-27 DIAGNOSIS — F331 Major depressive disorder, recurrent, moderate: Secondary | ICD-10-CM

## 2017-12-27 DIAGNOSIS — G8929 Other chronic pain: Secondary | ICD-10-CM | POA: Diagnosis not present

## 2017-12-27 DIAGNOSIS — H3553 Other dystrophies primarily involving the sensory retina: Secondary | ICD-10-CM

## 2017-12-27 DIAGNOSIS — G894 Chronic pain syndrome: Secondary | ICD-10-CM | POA: Diagnosis not present

## 2017-12-27 NOTE — Patient Instructions (Addendum)
I'm sorry you had that experience last month!  We will ask Rachel Duran to give you a call about recent experience.  Continue current medicines, return in 3 months for follow up visit.

## 2017-12-27 NOTE — Progress Notes (Signed)
BP 120/82 (BP Location: Left Arm, Patient Position: Sitting, Cuff Size: Normal)   Pulse 95   Temp 98.7 F (37.1 C) (Oral)   Ht 5\' 4"  (1.626 m)   Wt 133 lb 12 oz (60.7 kg)   LMP 07/25/1998   SpO2 96%   BMI 22.96 kg/m    CC: 3 mo chronic pain management visit Subjective:    Patient ID: Rachel Duran, female    DOB: 1956/02/26, 62 y.o.   MRN: [id number]  HPI: Rachel Duran is a 62 y.o. female presenting on 12/27/2017 for 3 mo follow up   Bad experience with narcotic refill request last month - was leaving town and asking to pick up refill 1 day early. I was out of town so request was sent to another provider who approved request. However, she felt like she was singled out for drug seeking behavior by initial intake staff, states she got off the phone shaken and in tears. I apologized for this experience. Requests to be contacted by [facility] to share her experience.   Chronic pain stemming from known cervical and lumbar DDD. Current regimen is embeda 80/3.2mg  daily #30/month with tramadol #40 PRN (last filled 05/2017). Tolerates medication well. Also uses indomethacin cream or voltaren gel PRN. Constipation controlled with linzess, miralax and fiber supplement.   Latest steroid injections were ineffective and very painful. Worried about limited options to help manage pain.   Lyrica for FM prescribed through rheum. With warmer weather planning to be more active at pool to help FM.   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   Relevant past medical, surgical, family and social history reviewed and updated as indicated. Interim medical history since our last visit reviewed. Allergies and medications reviewed and updated. Outpatient Medications Prior to Visit  Medication Sig Dispense Refill  . acyclovir (ZOVIRAX) 800 MG tablet TAKE ONE-HALF TABLET BY  MOUTH EVERY DAY 45 tablet 1  . ALPRAZolam (XANAX) 1 MG tablet Take 1 tablet (1 mg total) by mouth 3  (three) times daily as needed for anxiety. With 1/2 tab at night time  0  . amLODipine (NORVASC) 5 MG tablet Take 1 tablet (5 mg total) by mouth daily. 90 tablet 3  . amphetamine-dextroamphetamine (ADDERALL XR) 30 MG 24 hr capsule Take 1 capsule (30 mg total) by mouth every morning. 90 capsule 0  . ARIPiprazole (ABILIFY) 5 MG tablet Take 5 mg by mouth daily.     Marland Kitchen aspirin EC 81 MG tablet Take 81 mg by mouth every other day.     . Calcium-Magnesium-Vitamin D 706-23-762 MG-MG-UNIT TB24 Take 1 tablet by mouth 2 (two) times daily.    . cyanocobalamin (,VITAMIN B-12,) 1000 MCG/ML injection INJECT 1ML INTRAMUSCULARLY EVERY 21 DAYS(DISCARD 28 DAYS AFER FIRST USE) 3 mL 1  . diclofenac sodium (VOLTAREN) 1 % GEL Voltaren Gel 3 grams to 3 large joints upto TID 3 TUBES with 3 refills 3 Tube 3  . diclofenac sodium (VOLTAREN) 1 % GEL Apply three grams to three large joints up to three times a day 3 Tube 3  . esomeprazole (NEXIUM) 40 MG capsule TAKE 1 CAPSULE BY MOUTH TWICE DAILY BEFORE A MEAL 60 capsule 0  . Eszopiclone 3 MG TABS TAKE ONE TABLET AT BEDTIME 90 tablet 0  . ferrous sulfate 325 (65 FE) MG tablet Take 1 tablet (325 mg total) by mouth once a week.    . fluticasone (FLONASE) 50 MCG/ACT nasal spray USE 2 SPRAYS IN  BOTH NOSTRILS DAILY 16 g 5  . lamoTRIgine (LAMICTAL) 200 MG tablet Take 200 mg by mouth daily.    Marland Kitchen linaclotide (LINZESS) 290 MCG CAPS capsule Take 1 capsule (290 mcg total) by mouth daily. 90 capsule 3  . LYRICA 150 MG capsule TAKE 1 CAPSULE BY MOUTH 3  TIMES DAILY 270 capsule 0  . magic mouthwash w/lidocaine SOLN Take 5 mLs by mouth 3 (three) times daily as needed for mouth pain. Benadryl/viscous lidocaine 2%/maalox 120 mL 0  . MAGNESIUM PO Take 1,000 mg by mouth 3 (three) times daily.    . Manganese 50 MG TABS Take 50 mg by mouth daily.     . Morphine-Naltrexone 80-3.2 MG CPCR Take 1 tablet by mouth daily. 30 capsule 0  . Multiple Vitamins-Minerals (ICAPS AREDS 2) CAPS Take 1 capsule by  mouth daily.    . NONFORMULARY OR COMPOUNDED ITEM Testosterone propionate 2% in white petrolatum, apply small amount once daily for 5 days per week. 60 each 1  . ondansetron (ZOFRAN) 4 MG tablet TAKE 1 TABLET BY MOUTH EVERY 8 HOURS AS NEEDED FOR NAUSEA OR VOMITING 30 tablet 0  . pentosan polysulfate (ELMIRON) 100 MG capsule Take 100 mg by mouth 3 (three) times daily.    . phenazopyridine (PYRIDIUM) 100 MG tablet Take 1 tablet (100 mg total) by mouth 3 (three) times daily as needed for pain. 20 tablet 0  . Polyethylene Glycol 3350 (MIRALAX PO) Take 17 g by mouth 4 (four) times daily.     . traMADol (ULTRAM) 50 MG tablet Take 1 tablet (50 mg total) by mouth 2 (two) times daily as needed. 40 tablet 0  . ursodiol (ACTIGALL) 300 MG capsule TAKE 1 CAPSULE TWICE DAILY 60 capsule 0  . YUVAFEM 10 MCG TABS vaginal tablet Place 1 tablet (10 mcg total) vaginally 3 (three) times a week. 36 tablet 3   No facility-administered medications prior to visit.      Per HPI unless specifically indicated in ROS section below Review of Systems     Objective:    BP 120/82 (BP Location: Left Arm, Patient Position: Sitting, Cuff Size: Normal)   Pulse 95   Temp 98.7 F (37.1 C) (Oral)   Ht 5\' 4"  (1.626 m)   Wt 133 lb 12 oz (60.7 kg)   LMP 07/25/1998   SpO2 96%   BMI 22.96 kg/m   Wt Readings from Last 3 Encounters:  12/27/17 133 lb 12 oz (60.7 kg)  09/26/17 138 lb (62.6 kg)  09/22/17 134 lb (60.8 kg)    Physical Exam  Constitutional: She appears well-developed and well-nourished. No distress.  Psychiatric: She has a normal mood and affect. Her behavior is normal. Thought content normal.  Nursing note and vitals reviewed.  Results for orders placed or performed in visit on 11/22/17  Rubeola antibody IgG  Result Value Ref Range   Rubeola IgG <25.00 (L) AU/mL      Assessment & Plan:   Problem List Items Addressed This Visit    Chronic pain syndrome    Continue once daily embeda with rare tramadol  prn breakthrough pain.  Very reliable patient, stable on current regimen.       DDD (degenerative disc disease), cervical   DDD (degenerative disc disease), lumbar   Encounter for chronic pain management - Primary    [place] CSRS reviewed and appropriate.  Will ask [facility] to contact patient about recent poor experience.       Fibromyalgia    Hopeful  to help manage with aquatic activity this summer.       HNP (herniated nucleus pulposus), lumbar   MDD (major depressive disorder), recurrent episode, moderate (HCC)    Chronic, stable. Followed by Dr [facility] psychiatrist.      Stargardt's disease    Recent eye exam noted progression           No orders of the defined types were placed in this encounter.  No orders of the defined types were placed in this encounter.   Follow up plan: No follow-ups on file.  Ria Bush, MD

## 2017-12-30 NOTE — Assessment & Plan Note (Signed)
Recent eye exam noted progression

## 2017-12-30 NOTE — Assessment & Plan Note (Signed)
Hopeful to help manage with aquatic activity this summer.

## 2017-12-30 NOTE — Assessment & Plan Note (Signed)
Continue once daily embeda with rare tramadol prn breakthrough pain.  Very reliable patient, stable on current regimen.

## 2017-12-30 NOTE — Assessment & Plan Note (Addendum)
[place] CSRS reviewed and appropriate.  Will ask [facility] to contact patient about recent poor experience.

## 2017-12-30 NOTE — Assessment & Plan Note (Signed)
Chronic, stable. Followed by Dr [facility] psychiatrist.

## 2018-01-03 ENCOUNTER — Telehealth: Payer: Self-pay

## 2018-01-03 ENCOUNTER — Ambulatory Visit: Payer: Medicare Other

## 2018-01-03 NOTE — Telephone Encounter (Signed)
Pt was scheduled for 2nd MMR vaccine; Dr Darnell Level said if pt is not going to do any international travel should not need a second MMR; pt had MMR vaccine on 11/28/17. I spoke with pt and she has no international travel plans so was advised per guidelines one MMR vaccine should be sufficient. Pt was appreciative and nurse visit cancelled. FYI to Dr Darnell Level.

## 2018-01-15 ENCOUNTER — Ambulatory Visit (INDEPENDENT_AMBULATORY_CARE_PROVIDER_SITE_OTHER): Payer: Medicare Other | Admitting: Gastroenterology

## 2018-01-15 ENCOUNTER — Encounter: Payer: Self-pay | Admitting: Gastroenterology

## 2018-01-15 VITALS — BP 120/80 | HR 88 | Ht 63.75 in | Wt 132.0 lb

## 2018-01-15 DIAGNOSIS — K581 Irritable bowel syndrome with constipation: Secondary | ICD-10-CM

## 2018-01-15 DIAGNOSIS — R195 Other fecal abnormalities: Secondary | ICD-10-CM | POA: Diagnosis not present

## 2018-01-15 DIAGNOSIS — K831 Obstruction of bile duct: Secondary | ICD-10-CM | POA: Diagnosis not present

## 2018-01-15 DIAGNOSIS — K219 Gastro-esophageal reflux disease without esophagitis: Secondary | ICD-10-CM

## 2018-01-15 MED ORDER — ONDANSETRON HCL 8 MG PO TABS: 8.0000 mg | ORAL_TABLET | ORAL | 0 refills | Status: DC

## 2018-01-15 MED ORDER — URSODIOL 300 MG PO CAPS: 300.0000 mg | ORAL_CAPSULE | Freq: Two times a day (BID) | ORAL | 11 refills | Status: DC

## 2018-01-15 MED ORDER — LINACLOTIDE 290 MCG PO CAPS: 290.0000 ug | ORAL_CAPSULE | Freq: Every day | ORAL | 3 refills | Status: DC

## 2018-01-15 MED ORDER — NA SULFATE-K SULFATE-MG SULF 17.5-3.13-1.6 GM/177ML PO SOLN: 1.0000 | Freq: Once | ORAL | 0 refills | Status: AC

## 2018-01-15 MED ORDER — ESOMEPRAZOLE MAGNESIUM 40 MG PO CPDR: DELAYED_RELEASE_CAPSULE | ORAL | 11 refills | Status: DC

## 2018-01-15 MED ORDER — METOCLOPRAMIDE HCL 10 MG PO TABS: 10.0000 mg | ORAL_TABLET | ORAL | 0 refills | Status: DC

## 2018-01-15 NOTE — Progress Notes (Signed)
62 y.o. G28P2002 [name] here for annual exam.    Still with vaginal dryness and tightness.  She has seen Rachel Duran for pelvic floor issues including Interstitial cystitis.  Last saw her a year ago. Finds her really helpful.  The testosterone really helps a lot.  Feeling OK on her current dosage. Using testosterone once a day for 5 nights per week.  It expires quickly.   Anxiety about potentially losing her vision due to her Stargardts's disease.  Has not started any planning for this from a practical standpoint.  PCP:  Rachel Bush, MD   Patient's last menstrual period was 07/25/1998.           Sexually active: Yes.    The current method of family planning is post menopausal status.    Exercising: Yes.    walking, yoga and water aerobics. Smoker:  no  Health Maintenance: Pap: 01-13-17 Neg, 12-19-14  History of abnormal Pap:  no MMG:  11-06-17 Implants/Density C/Neg/BiRads1 Colonoscopy: Scheduled 02-22-18  BMD: 02/07/17 -  Result :Osteopenia of right hip.  FRAX model  - low fracture risk.  TDaP:  11-10-16 Gardasil:   no HIV: years ago Neg Hep C: Neg 07-16-15 Screening Labs:  PCP.   reports that she has never smoked. She has never used smokeless tobacco. She reports that she does not drink alcohol or use drugs.  Past Medical History:  Diagnosis Date  . Abdominal pain last 4 months   and nausea also  . Allergy   . Anemia   . Anxiety   . 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 C6- C7 -hx. past fusion(bone graft used)  . Cholecystitis   . Chronic pain   . DDD (degenerative disc disease), lumbar 09/2015   dextroscoliosis with multilevel DDD and facet arthrosis most notable for R foraminal disc protrusion L4/5 producing severe R neural foraminal stenosis abutting R L4 nerve root, moderate spinal canal and mild lat recesss and R neural foraminal stenosis L3/4 (MRI)  . Depression    bipolar depression  . Disorders of porphyrin metabolism    . Felon of finger of left hand 11/10/2016  . Fibromyalgia   . GERD (gastroesophageal reflux disease)   . Internal hemorrhoids   . Interstitial cystitis 06-06-12   hx.  . Irritable bowel syndrome   . PONV (postoperative nausea and vomiting)    now uses stomach blockers and no ponv  . 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 Raynauds disease    hx.  . Seronegative arthritis    Rachel Duran  . 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 ANTERIOR CERVICAL DECOMP/DISCECTOMY FUSION  2004   C5/6, C6/7  . ANTERIOR CERVICAL DECOMP/DISCECTOMY FUSION  02/2016   C3/4, C4/5 with plating Rachel Duran)  . AUGMENTATION MAMMAPLASTY Bilateral 03/25/2010  . BREAST ENHANCEMENT SURGERY  2010  . BREAST IMPLANT EXCHANGE  10/2014   exchange saline implants, B mastopexy/capsulorraphy (Rachel Duran [facility])  . BUNIONECTOMY Bilateral yrs ago  . [place]   x 1  . CHOLECYSTECTOMY  06/11/2012   Procedure: LAPAROSCOPIC CHOLECYSTECTOMY WITH INTRAOPERATIVE CHOLANGIOGRAM;  Surgeon: Rachel Curry, MD,FACS;  Location: [facility];  Service: General;  Laterality: N/A;  . CYSTOSCOPY    .  ERCP  05/22/2012   Procedure: ENDOSCOPIC RETROGRADE CHOLANGIOPANCREATOGRAPHY (ERCP);  Surgeon: Rachel Artist, MD,FACG;  Location: [facility];  Service: Endoscopy;  Laterality: N/A;  . ERCP N/A 09/17/2013   Procedure: ENDOSCOPIC RETROGRADE CHOLANGIOPANCREATOGRAPHY (ERCP);  Surgeon: Rachel Artist, MD;  Location: [facility];  Service: Endoscopy;  Laterality: N/A;  . ESOPHAGOGASTRODUODENOSCOPY  09/2016   WNL. esophagus dilated Rachel Duran)  . HEMORRHOID BANDING  09-23-13   --Dr. Greer Duran  . HERNIA REPAIR     inguinal  . HYSTEROSCOPY W/ ENDOMETRIAL ABLATION    . NASAL SINUS SURGERY     x5     Current Outpatient Medications  Medication Sig Dispense Refill  . acyclovir (ZOVIRAX) 800 MG tablet TAKE ONE-HALF TABLET BY  MOUTH EVERY DAY 45 tablet 1  . ALPRAZolam (XANAX) 1 MG tablet Take 1 tablet (1 mg total) by mouth 3 (three) times daily as needed for anxiety. With 1/2 tab at night time  0  . amLODipine (NORVASC) 5 MG tablet Take 1 tablet (5 mg total) by mouth daily. 90 tablet 3  . amphetamine-dextroamphetamine (ADDERALL XR) 30 MG 24 hr capsule Take 1 capsule (30 mg total) by mouth every morning. 90 capsule 0  . ARIPiprazole (ABILIFY) 5 MG tablet Take 5 mg by mouth daily.     Marland Kitchen aspirin EC 81 MG tablet Take 81 mg by mouth every other day.     . Calcium-Magnesium-Vitamin D [id number] MG-MG-UNIT TB24 Take 1 tablet by mouth 2 (two) times daily.    . cyanocobalamin (,VITAMIN B-12,) 1000 MCG/ML injection INJECT 1ML INTRAMUSCULARLY EVERY 21 DAYS(DISCARD 28 DAYS AFER FIRST USE) 3 mL 1  . diclofenac sodium (VOLTAREN) 1 % GEL Apply three grams to three large joints up to three times a day 3 Tube 3  . esomeprazole (NEXIUM) 40 MG capsule TAKE 1 CAPSULE BY MOUTH TWICE DAILY BEFORE A MEAL 60 capsule 11  . Eszopiclone 3 MG TABS TAKE ONE TABLET AT BEDTIME 90 tablet 0  . ferrous sulfate 325 (65 FE) MG tablet Take 325 mg by mouth 3 (three) times a week.    . fluticasone (FLONASE) 50 MCG/ACT nasal spray USE 2 SPRAYS IN BOTH NOSTRILS DAILY 16 g 5  . lamoTRIgine (LAMICTAL) 200 MG tablet Take 200 mg by mouth daily.    Marland Kitchen linaclotide (LINZESS) 290 MCG CAPS capsule Take 1 capsule (290 mcg total) by mouth daily. 90 capsule 3  . LYRICA 150 MG capsule TAKE 1 CAPSULE BY MOUTH 3  TIMES DAILY 270 capsule 0  . magic mouthwash w/lidocaine SOLN Take 5 mLs by mouth 3 (three) times daily as needed for mouth pain. Benadryl/viscous lidocaine 2%/maalox 120 mL 0  . MAGNESIUM PO Take 1,000 mg by mouth 3 (three) times daily.    . Manganese 50 MG TABS Take 50 mg by mouth daily.     . metoCLOPramide (REGLAN) 10 MG tablet Take  1 tablet (10 mg total) by mouth as directed. 2 tablet 0  . Morphine-Naltrexone 80-3.2 MG CPCR Take 1 tablet by mouth daily. 30 capsule 0  . Multiple Vitamins-Minerals (ICAPS AREDS 2) CAPS Take 1 capsule by mouth daily.    . NONFORMULARY OR COMPOUNDED ITEM Testosterone propionate 2% in white petrolatum, apply small amount once daily for 5 days per week. 60 each 1  . ondansetron (ZOFRAN) 4 MG tablet TAKE 1 TABLET BY MOUTH EVERY 8 HOURS AS NEEDED FOR NAUSEA OR VOMITING 30 tablet 0  . ondansetron (ZOFRAN) 8 MG tablet Take 1 tablet (8 mg total)  by mouth as directed. 2 tablet 0  . pentosan polysulfate (ELMIRON) 100 MG capsule Take 100 mg by mouth 3 (three) times daily.    . phenazopyridine (PYRIDIUM) 100 MG tablet Take 1 tablet (100 mg total) by mouth 3 (three) times daily as needed for pain. 20 tablet 0  . Polyethylene Glycol 3350 (MIRALAX PO) Take 17 g by mouth 4 (four) times daily.     . SF 5000 PLUS 1.1 % CREA dental cream     . traMADol (ULTRAM) 50 MG tablet Take 1 tablet (50 mg total) by mouth 2 (two) times daily as needed. 40 tablet 0  . ursodiol (ACTIGALL) 300 MG capsule Take 1 capsule (300 mg total) by mouth 2 (two) times daily. 60 capsule 11  . YUVAFEM 10 MCG TABS vaginal tablet Place 1 tablet (10 mcg total) vaginally 3 (three) times a week. 36 tablet 3   No current facility-administered medications for this visit.     Family History  Problem Relation Age of Onset  . CAD Father 68       MI, nonsmoker  . Esophageal Duran Father 7  . Stomach Duran Father   . Scleroderma Mother   . Hypertension Mother   . Esophageal Duran Paternal Grandfather   . Stomach Duran Paternal Grandfather   . Diabetes Maternal Grandmother   . Arthritis Brother   . Stroke Neg Hx   . Colon Duran Neg Hx     Review of Systems  HENT: Positive for sinus pain.   Eyes: Negative.   Cardiovascular: Negative.   Gastrointestinal: Positive for abdominal pain and constipation.       Abdominal bloating   Endocrine: Negative.   Genitourinary: Positive for frequency and urgency.       Loss of urine with sneeze or cough  Musculoskeletal: Positive for arthralgias.       Muscle and joint pain  Skin: Negative.   Allergic/Immunologic: Negative.   Neurological: Negative.   Hematological: Negative.   Psychiatric/Behavioral:       Depression and anxiety    Exam:   BP 122/66 (BP Location: Right Arm, Patient Position: Sitting, Cuff Size: Normal)   Pulse 88   Resp 16   Ht 5\' 4"  (1.626 m)   Wt 132 lb (59.9 kg)   LMP 07/25/1998   BMI 22.66 kg/m     General appearance: alert, cooperative and appears stated age Head: Normocephalic, without obvious abnormality, atraumatic Neck: no adenopathy, supple, symmetrical, trachea midline and thyroid normal to inspection and palpation Lungs: clear to auscultation bilaterally Breasts: normal appearance, no masses or tenderness, No nipple retraction or dimpling, No nipple discharge or bleeding, No axillary or supraclavicular adenopathy Heart: regular rate and rhythm Abdomen: soft, non-tender; no masses, no organomegaly Extremities: extremities normal, atraumatic, no cyanosis or edema Skin: Skin color, texture, turgor normal. No rashes or lesions Lymph nodes: Cervical, supraclavicular, and axillary nodes normal. No abnormal inguinal nodes palpated Neurologic: Grossly normal  Pelvic: External genitalia:  no lesions              Urethra:  normal appearing urethra with no masses, tenderness or lesions              Bartholins and Skenes: normal                 Vagina: normal appearing vagina with normal color and discharge, no lesions              Cervix: no lesions  Pap taken: No. Bimanual Exam:  Uterus:  normal size, contour, position, consistency, mobility, non-tender              Adnexa: no mass, fullness, tenderness              Rectal exam: Yes.  .  Confirms.              Anus:  normal sphincter tone, no lesions  Chaperone was present  for exam.  Assessment:   Well woman visit with normal exam. Vaginal atrophy.  Decreased libido.  Osteopenia.  Stargardt's disease.  Progressive loss of vision.  Pelvic pain.   Duran: Mammogram screening. Recommended self breast awareness. Pap and HR HPV as above. Guidelines for Calcium, Vitamin D, regular exercise program including cardiovascular and weight bearing exercise. Referral back to [facility]  Refill Vagifem for 9 months to local pharmacy.  Refill of testosterone ointment. We discussed effects on cardiovascular system potential breast Duran.  BMD next year.  We discussed resources for her regarding vision loss.  Follow up annually and prn.   After visit summary provided.

## 2018-01-15 NOTE — Patient Instructions (Addendum)
We have sent the following medications to your pharmacy for you to pick up at your convenience: Actigall, Linzess and Nexium.   Increase your Miralax 2 doses every day leading up to Colonoscopy.   You have been scheduled for a colonoscopy. Please follow written instructions given to you at your visit today.  Please pick up your prep supplies at the pharmacy within the next 1-3 days. If you use inhalers (even only as needed), please bring them with you on the day of your procedure. Your physician has requested that you go to www.startemmi.com and enter the access code given to you at your visit today. This web site gives a general overview about your procedure. However, you should still follow specific instructions given to you by our office regarding your preparation for the procedure.  Thank you for choosing me and [facility].  Pricilla Riffle. Dagoberto Ligas., MD., Marval Regal

## 2018-01-15 NOTE — Progress Notes (Signed)
    History of Present Illness: This is a 62 year old female with IBS-C, GERD, biliary stasis, positive Cologuard.  She relates her IBS with constipation is well controlled on Linzess, MiraLAX and Culturelle.  She relates that Culturelle has been helpful for bloating symptoms that have not been fully addressed by Linzess and MiraLAX.  Reflux symptoms are well controlled on daily Nexium.  She had a positive Cologuard in April 2018 and we advised to schedule colonoscopy which was scheduled for the summer 2018 however she did not follow through and has not yet had colonoscopy.  She is concerned about the bowel prep leading to nausea and vomiting and leading to a flare of hemorrhoid symptoms.  Current Medications, Allergies, Past Medical History, Past Surgical History, Family History and Social History were reviewed in [employer] record.  Physical Exam: General: Well developed, well nourished, no acute distress Head: Normocephalic and atraumatic Eyes:  sclerae anicteric, EOMI Ears: Normal auditory acuity Mouth: No deformity or lesions Lungs: Clear throughout to auscultation Heart: Regular rate and rhythm; no murmurs, rubs or bruits Abdomen: Soft, non tender and non distended. No masses, hepatosplenomegaly or hernias noted. Normal Bowel sounds Rectal: Deferred to colonoscopy Musculoskeletal: Symmetrical with no gross deformities  Pulses:  Normal pulses noted Extremities: No clubbing, cyanosis, edema or deformities noted Neurological: Alert oriented x 4, grossly nonfocal Psychological:  Alert and cooperative. Normal mood and affect  Assessment and Recommendations:  1. IBS-C.  Continue Linzess to 90 mcg daily, MiraLAX 2 scoops daily, Culturelle daily. REV in 1 year.   2. GERD.  Continue Nexium 40 mg daily and standard antireflux measures she can attempt to decrease her dose to every other day if symptoms remain under good control. REV in 1 year.   3.  Biliary stasis.   Continue  ursodiol 300 mg twice daily. REV in 1 year.   4.  Positive Cologuard.  She is reluctant but agreeable to proceed with colonoscopy.  She understands the risk of colorectal neoplasms with a positive Cologuard.  She is advised to double her dose of MiraLAX for 3 to 4 days prior to colonoscopy.  Zofran 8 mg po and Reglan 10 mg po before each dose of split bowel prep. The risks (including bleeding, perforation, infection, missed lesions, medication reactions and possible hospitalization or surgery if complications occur), benefits, and alternatives to colonoscopy with possible biopsy and possible polypectomy were discussed with the patient and they consent to proceed.

## 2018-01-16 ENCOUNTER — Encounter: Payer: Self-pay | Admitting: Family Medicine

## 2018-01-16 ENCOUNTER — Other Ambulatory Visit: Payer: Self-pay | Admitting: Family Medicine

## 2018-01-16 NOTE — Telephone Encounter (Signed)
Last filled:  10/20/17, #90 Last OV:  12/27/17 Next OV:  04/05/18

## 2018-01-17 ENCOUNTER — Ambulatory Visit (INDEPENDENT_AMBULATORY_CARE_PROVIDER_SITE_OTHER): Payer: Medicare Other | Admitting: Obstetrics and Gynecology

## 2018-01-17 ENCOUNTER — Other Ambulatory Visit: Payer: Self-pay

## 2018-01-17 ENCOUNTER — Encounter: Payer: Self-pay | Admitting: Obstetrics and Gynecology

## 2018-01-17 VITALS — BP 122/66 | HR 88 | Resp 16 | Ht 64.0 in | Wt 132.0 lb

## 2018-01-17 DIAGNOSIS — R102 Pelvic and perineal pain: Secondary | ICD-10-CM | POA: Diagnosis not present

## 2018-01-17 DIAGNOSIS — Z01419 Encounter for gynecological examination (general) (routine) without abnormal findings: Secondary | ICD-10-CM

## 2018-01-17 MED ORDER — YUVAFEM 10 MCG VA TABS: 1.0000 | ORAL_TABLET | VAGINAL | 3 refills | Status: DC

## 2018-01-17 MED ORDER — NONFORMULARY OR COMPOUNDED ITEM: 1 refills | Status: DC

## 2018-01-17 NOTE — Patient Instructions (Signed)
Consider checking a list on the Internet called compassionate disability, which may allow you to receive special governmental financial benefits.   EXERCISE AND DIET:  We recommended that you start or continue a regular exercise program for good health. Regular exercise means any activity that makes your heart beat faster and makes you sweat.  We recommend exercising at least 30 minutes per day at least 3 days a week, preferably 4 or 5.  We also recommend a diet low in fat and sugar.  Inactivity, poor dietary choices and obesity can cause diabetes, heart attack, stroke, and kidney damage, among others.    ALCOHOL AND SMOKING:  Women should limit their alcohol intake to no more than 7 drinks/beers/glasses of wine (combined, not each!) per week. Moderation of alcohol intake to this level decreases your risk of breast cancer and liver damage. And of course, no recreational drugs are part of a healthy lifestyle.  And absolutely no smoking or even second hand smoke. Most people know smoking can cause heart and lung diseases, but did you know it also contributes to weakening of your bones? Aging of your skin?  Yellowing of your teeth and nails?  CALCIUM AND VITAMIN D:  Adequate intake of calcium and Vitamin D are recommended.  The recommendations for exact amounts of these supplements seem to change often, but generally speaking 600 mg of calcium (either carbonate or citrate) and 800 units of Vitamin D per day seems prudent. Certain women may benefit from higher intake of Vitamin D.  If you are among these women, your doctor will have told you during your visit.    PAP SMEARS:  Pap smears, to check for cervical cancer or precancers,  have traditionally been done yearly, although recent scientific advances have shown that most women can have pap smears less often.  However, every woman still should have a physical exam from her gynecologist every year. It will include a breast check, inspection of the vulva and  vagina to check for abnormal growths or skin changes, a visual exam of the cervix, and then an exam to evaluate the size and shape of the uterus and ovaries.  And after 60 years of age, a rectal exam is indicated to check for rectal cancers. We will also provide age appropriate advice regarding health maintenance, like when you should have certain vaccines, screening for sexually transmitted diseases, bone density testing, colonoscopy, mammograms, etc.   MAMMOGRAMS:  All women over 72 years old should have a yearly mammogram. Many facilities now offer a "3D" mammogram, which may cost around $50 extra out of pocket. If possible,  we recommend you accept the option to have the 3D mammogram performed.  It both reduces the number of women who will be called back for extra views which then turn out to be normal, and it is better than the routine mammogram at detecting truly abnormal areas.    COLONOSCOPY:  Colonoscopy to screen for colon cancer is recommended for all women at age 25.  We know, you hate the idea of the prep.  We agree, BUT, having colon cancer and not knowing it is worse!!  Colon cancer so often starts as a polyp that can be seen and removed at colonscopy, which can quite literally save your life!  And if your first colonoscopy is normal and you have no family history of colon cancer, most women don't have to have it again for 10 years.  Once every ten years, you can do something that  may end up saving your life, right?  We will be happy to help you get it scheduled when you are ready.  Be sure to check your insurance coverage so you understand how much it will cost.  It may be covered as a preventative service at no cost, but you should check your particular policy.

## 2018-01-17 NOTE — Telephone Encounter (Signed)
Eprescribed.

## 2018-01-30 ENCOUNTER — Emergency Department: Payer: Medicare Other

## 2018-01-30 ENCOUNTER — Emergency Department
Admission: EM | Admit: 2018-01-30 | Discharge: 2018-01-30 | Disposition: A | Discharge status: Home / Self Care | Payer: Medicare Other | Attending: Emergency Medicine | Admitting: Emergency Medicine

## 2018-01-30 ENCOUNTER — Encounter: Payer: Self-pay | Admitting: Emergency Medicine

## 2018-01-30 ENCOUNTER — Other Ambulatory Visit: Payer: Self-pay

## 2018-01-30 DIAGNOSIS — S161XXA Strain of muscle, fascia and tendon at neck level, initial encounter: Secondary | ICD-10-CM | POA: Diagnosis not present

## 2018-01-30 DIAGNOSIS — Y92007 Garden or yard of unspecified non-institutional (private) residence as the place of occurrence of the external cause: Secondary | ICD-10-CM | POA: Diagnosis not present

## 2018-01-30 DIAGNOSIS — Z7982 Long term (current) use of aspirin: Secondary | ICD-10-CM | POA: Insufficient documentation

## 2018-01-30 DIAGNOSIS — Y9302 Activity, running: Secondary | ICD-10-CM | POA: Insufficient documentation

## 2018-01-30 DIAGNOSIS — W0110XA Fall on same level from slipping, tripping and stumbling with subsequent striking against unspecified object, initial encounter: Secondary | ICD-10-CM | POA: Diagnosis not present

## 2018-01-30 DIAGNOSIS — Y999 Unspecified external cause status: Secondary | ICD-10-CM | POA: Insufficient documentation

## 2018-01-30 DIAGNOSIS — Z79899 Other long term (current) drug therapy: Secondary | ICD-10-CM | POA: Insufficient documentation

## 2018-01-30 DIAGNOSIS — S199XXA Unspecified injury of neck, initial encounter: Secondary | ICD-10-CM | POA: Diagnosis present

## 2018-01-30 MED ORDER — CYCLOBENZAPRINE HCL 5 MG PO TABS: 5.0000 mg | ORAL_TABLET | Freq: Three times a day (TID) | ORAL | 0 refills | Status: DC | PRN

## 2018-01-30 MED ORDER — ONDANSETRON 4 MG PO TBDP
4.0000 mg | ORAL_TABLET | Freq: Once | ORAL | Status: AC
  Administered 2018-01-30: 4 mg via ORAL
  Filled 2018-01-30: qty 1

## 2018-01-30 NOTE — ED Provider Notes (Signed)
[facility] Emergency Department Provider Note ____________________________________________  Time seen: Approximately 11:28 PM  I have reviewed the triage vital signs and the nursing notes.   HISTORY  Chief Complaint Fall    HPI Rachel Duran is a 62 y.o. female who presents to the emergency department for evaluation and treatment of neck pain.  Patient states that she was outside playing with her grandchild and her dogs were outside playing as well and the ran in front of her as she was running toward her grandchild causing her to fall.  She states that it happened so fast that she was unable to really break her fall with her hands.  She has a significant past medical history for cervical disc disease with fusions.  She states that the neck pain has not improved much over the past few days.  She is under pain management for chronic pain and her daily medication has not seemed to resolve her symptoms. Past Medical History:  Diagnosis Date  . Abdominal pain last 4 months   and nausea also  . Allergy   . Anemia   . Anxiety   . 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 C6- C7 -hx. past fusion(bone graft used)  . Cholecystitis   . Chronic pain   . DDD (degenerative disc disease), lumbar 09/2015   dextroscoliosis with multilevel DDD and facet arthrosis most notable for R foraminal disc protrusion L4/5 producing severe R neural foraminal stenosis abutting R L4 nerve root, moderate spinal canal and mild lat recesss and R neural foraminal stenosis L3/4 (MRI)  . Depression    bipolar depression  . Disorders of porphyrin metabolism   . Felon of finger of left hand 11/10/2016  . Fibromyalgia   . GERD (gastroesophageal reflux disease)   . Internal hemorrhoids   . Interstitial cystitis 06-06-12   hx.  . Irritable bowel syndrome   . PONV (postoperative nausea and vomiting)    now uses stomach blockers and no ponv  . Positive QuantiFERON-TB Gold test  02/07/2012   Evaluated in Pulmonary clinic/ [facility]/ [place] /  02/07/12 > referred to Health Dept 02/10/2012     - POS GOLD    01/31/2012    . Raynauds disease    hx.  . Seronegative arthritis    [name]  . 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 Dyslipidemia 09/22/2017  . DDD (degenerative disc disease), cervical 04/03/2017  . DDD (degenerative disc disease), lumbar 04/03/2017  . Onychomycosis 03/21/2017  . Dysphagia 10/18/2016  . Encounter for chronic pain management 10/18/2016  . Biliary stasis 09/26/2016  . Fever blister 08/18/2016  . Urinary urgency 07/05/2016  . HNP (herniated nucleus pulposus), lumbar 09/23/2015  . Sinus congestion 07/30/2015  . Iron deficiency 07/30/2015  . Health maintenance examination 06/15/2015  . 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 Sinus headache 03/09/2015  . Clavicle enlargement 12/13/2014  . Prediabetes 12/13/2014  . Plantar fasciitis, bilateral 09/11/2014  . Advanced care planning/counseling discussion 06/10/2014  . Medicare annual wellness visit, subsequent 06/10/2014  . Abnormal thyroid function test 06/10/2014  . Chronic pain syndrome 06/10/2014  . CFS (chronic fatigue syndrome) 04/08/2014  . Calculus of bile duct 09/17/2013  . Postmenopausal atrophic vaginitis 10/19/2012  . 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 Cervical disc disorder with radiculopathy of cervical region 05/28/2010  . Anemia of chronic disease 03/17/2009  .  CHEST PAIN UNSPECIFIED 02/28/2008  . APHTHOUS ULCERS 01/31/2008  . INSOMNIA, CHRONIC 11/09/2007  . Drug-induced constipation 10/11/2007  . ALLERGIC RHINITIS 04/12/2007  . MDD (major depressive disorder), recurrent episode, moderate ([place]) 03/05/2007  . [name] syndrome 03/05/2007  . GERD 03/05/2007  . ROSACEA 03/05/2007  . NEURALGIA 03/05/2007  . 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[place])  12/06/2006  . CYSTITIS, CHRONIC INTERSTITIAL 12/06/2006  . Fibromyalgia 12/06/2006    Past Surgical History:  Procedure Laterality Date  . ANTERIOR CERVICAL DECOMP/DISCECTOMY FUSION  2004   C5/6, C6/7  . ANTERIOR CERVICAL DECOMP/DISCECTOMY FUSION  02/2016   C3/4, C4/5 with plating [name])  . AUGMENTATION MAMMAPLASTY Bilateral 03/25/2010  . BREAST ENHANCEMENT SURGERY  2010  . BREAST IMPLANT EXCHANGE  10/2014   exchange saline implants, B mastopexy/capsulorraphy ([facility])  . BUNIONECTOMY Bilateral yrs ago  . [place]   x 1  . CHOLECYSTECTOMY  06/11/2012   Procedure: LAPAROSCOPIC CHOLECYSTECTOMY WITH INTRAOPERATIVE CHOLANGIOGRAM;  Surgeon: [name], MD,FACS;  Location: [facility] ORS;  Service: General;  Laterality: N/A;  . CYSTOSCOPY    . ERCP  05/22/2012   Procedure: ENDOSCOPIC RETROGRADE CHOLANGIOPANCREATOGRAPHY (ERCP);  Surgeon: [name], MD,FACG;  Location: [facility];  Service: Endoscopy;  Laterality: N/A;  . ERCP N/A 09/17/2013   Procedure: ENDOSCOPIC RETROGRADE CHOLANGIOPANCREATOGRAPHY (ERCP);  Surgeon: [name], MD;  Location: [facility];  Service: Endoscopy;  Laterality: N/A;  . ESOPHAGOGASTRODUODENOSCOPY  09/2016   WNL. esophagus dilated Fuller Plan)  . HEMORRHOID BANDING  09-23-13   --Dr. Greer Pickerel  . HERNIA REPAIR     inguinal  . HYSTEROSCOPY W/ ENDOMETRIAL ABLATION    . NASAL SINUS SURGERY     x5    Prior to Admission medications   Medication Sig Start Date End Date Taking? Authorizing Provider  acyclovir (ZOVIRAX) 800 MG tablet TAKE ONE-HALF TABLET BY  MOUTH EVERY DAY 07/11/17   Ria Bush, MD  ALPRAZolam Duanne Moron) 1 MG tablet Take 1 tablet (1 mg total) by mouth 3 (three) times daily as needed for anxiety. With 1/2 tab at night time 09/22/17   Ria Bush, MD  amLODipine (NORVASC) 5 MG tablet Take 1 tablet (5 mg total) by mouth daily. 09/26/17   Ofilia Neas, PA-C  amphetamine-dextroamphetamine (ADDERALL XR) 30 MG 24 hr  capsule Take 1 capsule (30 mg total) by mouth every morning. 12/14/17   Ria Bush, MD  ARIPiprazole (ABILIFY) 5 MG tablet Take 5 mg by mouth daily.  06/30/16   [provider]  aspirin EC 81 MG tablet Take 81 mg by mouth every other day.     [provider]  Calcium-Magnesium-Vitamin D 600-40-500 MG-MG-UNIT TB24 Take 1 tablet by mouth 2 (two) times daily.    [provider]  cyanocobalamin (,VITAMIN B-12,) 1000 MCG/ML injection INJECT 1ML INTRAMUSCULARLY EVERY 21 DAYS(DISCARD 28 DAYS AFER FIRST USE) 12/12/17   Ria Bush, MD  cyclobenzaprine (FLEXERIL) 5 MG tablet Take 1 tablet (5 mg total) by mouth 3 (three) times daily as needed for muscle spasms. 01/30/18   Sherrie George B, FNP  diclofenac sodium (VOLTAREN) 1 % GEL Apply three grams to three large joints up to three times a day 09/26/17   Ofilia Neas, PA-C  esomeprazole (NEXIUM) 40 MG capsule TAKE 1 CAPSULE BY MOUTH TWICE DAILY BEFORE A MEAL 01/15/18   [name], MD  Eszopiclone 3 MG TABS TAKE ONE TABLET AT BEDTIME 01/17/18   Ria Bush, MD  ferrous sulfate 325 (65 FE) MG  tablet Take 325 mg by mouth 3 (three) times a week.    [provider]  fluticasone (FLONASE) 50 MCG/ACT nasal spray USE 2 SPRAYS IN BOTH NOSTRILS DAILY 08/16/17   Ria Bush, MD  lamoTRIgine (LAMICTAL) 200 MG tablet Take 200 mg by mouth daily.    [provider]  linaclotide (LINZESS) 290 MCG CAPS capsule Take 1 capsule (290 mcg total) by mouth daily. 01/15/18   [name], MD  LYRICA 150 MG capsule TAKE 1 CAPSULE BY MOUTH 3  TIMES DAILY 11/27/17   Ofilia Neas, PA-C  magic mouthwash w/lidocaine SOLN Take 5 mLs by mouth 3 (three) times daily as needed for mouth pain. Benadryl/viscous lidocaine 2%/maalox 01/13/17   Ria Bush, MD  MAGNESIUM PO Take 1,000 mg by mouth 3 (three) times daily.    [provider]  Manganese 50 MG TABS Take 50 mg by mouth daily.     [provider]   metoCLOPramide (REGLAN) 10 MG tablet Take 1 tablet (10 mg total) by mouth as directed. 01/15/18   [name], MD  Morphine-Naltrexone 80-3.2 MG CPCR Take 1 tablet by mouth daily. 11/29/17   Tonia Ghent, MD  Multiple Vitamins-Minerals (ICAPS AREDS 2) CAPS Take 1 capsule by mouth daily.    [provider]  NONFORMULARY OR COMPOUNDED ITEM Testosterone propionate 2% in white petrolatum, apply small amount once daily for 5 days per week. 01/17/18   Nunzio Cobbs, MD  ondansetron (ZOFRAN) 4 MG tablet TAKE 1 TABLET BY MOUTH EVERY 8 HOURS AS NEEDED FOR NAUSEA OR VOMITING 03/21/17   Ria Bush, MD  ondansetron (ZOFRAN) 8 MG tablet Take 1 tablet (8 mg total) by mouth as directed. 01/15/18   [name], MD  pentosan polysulfate (ELMIRON) 100 MG capsule Take 100 mg by mouth 3 (three) times daily.    [provider]  phenazopyridine (PYRIDIUM) 100 MG tablet Take 1 tablet (100 mg total) by mouth 3 (three) times daily as needed for pain. 02/22/17   Ria Bush, MD  Polyethylene Glycol 3350 (MIRALAX PO) Take 17 g by mouth 4 (four) times daily.     [provider]  SF 5000 PLUS 1.1 % CREA dental cream  01/03/18   [provider]  traMADol (ULTRAM) 50 MG tablet Take 1 tablet (50 mg total) by mouth 2 (two) times daily as needed. 06/23/17   Ria Bush, MD  ursodiol (ACTIGALL) 300 MG capsule Take 1 capsule (300 mg total) by mouth 2 (two) times daily. 01/15/18   [name], MD  YUVAFEM 10 MCG TABS vaginal tablet Place 1 tablet (10 mcg total) vaginally 3 (three) times a week. 01/17/18   Nunzio Cobbs, MD    Allergies Codeine phosphate; Cymbalta [duloxetine hcl]; Celebrex [celecoxib]; Inh [isoniazid]; Lithium; Nucynta [tapentadol]; Silenor [doxepin hcl]; Meperidine hcl; and Sulfamethoxazole  Family History  Problem Relation Age of Onset  . CAD Father 32       MI, nonsmoker  . Esophageal cancer Father 37  . Stomach cancer  Father   . Scleroderma Mother   . Hypertension Mother   . Esophageal cancer Paternal Grandfather   . Stomach cancer Paternal Grandfather   . Diabetes Maternal Grandmother   . Arthritis Brother   . Stroke Neg Hx   . Colon cancer Neg Hx     Social History Social History   Tobacco Use  . Smoking status: Never Smoker  . Smokeless tobacco: Never Used  Substance Use Topics  . Alcohol use: No    Alcohol/week: 0.0 oz  . Drug use: No    Review of Systems Constitutional: Negative for fever. Cardiovascular: Negative for chest pain. Respiratory: Negative for shortness of breath. Musculoskeletal: Positive for neck pain Skin: Negative for open wound or lesion Neurological: Negative for decrease in sensation  ____________________________________________   PHYSICAL EXAM:  VITAL SIGNS: ED Triage Vitals [01/30/18 1534]  Enc Vitals Group     BP 119/71     Pulse Rate 82     Resp 16     Temp 98.7 F (37.1 C)     Temp Source Oral     SpO2 97 %     Weight 130 lb (59 kg)     Height 5\' 4"  (1.626 m)     Head Circumference      Peak Flow      Pain Score 7     Pain Loc      Pain Edu?      Excl. in [place]?     Constitutional: Alert and oriented. Well appearing and in no acute distress. Eyes: Conjunctivae are clear without discharge or drainage Head: Atraumatic Neck: Diffuse cervical spine tenderness to palpation without focal bony tenderness or abnormality. Respiratory: No cough. Respirations are even and unlabored. Musculoskeletal: Paracervical tenderness with palpation and movement of the head and shoulders Neurologic: Awake, alert, oriented x4. Skin: Intact Psychiatric: Affect and behavior are appropriate.  ____________________________________________   LABS (all labs ordered are listed, but only abnormal results are displayed)  Labs Reviewed - No data to display ____________________________________________  RADIOLOGY  X-ray of the cervical spine is negative for acute  bony abnormality per radiology. CT of the cervical spine is also negative for acute bony abnormality. ____________________________________________   PROCEDURES  Procedures  ____________________________________________   INITIAL IMPRESSION / ASSESSMENT AND PLAN / ED COURSE  ERIEL DUNCKEL is a 62 y.o. who presents to the emergency department for treatment and evaluation after a mechanical, non-syncopal fall a few days ago.  Patient was concerned because of her extensive cervical fusion history and pain not improving with her daily medication.  CT and x-ray both are negative for acute findings.  The patient will be treated with a 5 mg Flexeril and encouraged to continue her daily medications as prescribed.  Patient instructed to follow-up with orthopedics for symptoms that are not improving with medication and time.  She was also instructed to return to the emergency department for symptoms that change or worsen if unable schedule an appointment with orthopedics or primary care.  Medications  ondansetron (ZOFRAN-ODT) disintegrating tablet 4 mg (4 mg Oral Given 01/30/18 1756)    Pertinent labs & imaging results that were available during my care of the patient were reviewed by me and considered in my medical decision making (see chart for details).  _________________________________________   FINAL CLINICAL IMPRESSION(S) / ED DIAGNOSES  Final diagnoses:  Cervical strain, acute, initial encounter    ED Discharge Orders        Ordered    cyclobenzaprine (FLEXERIL) 5 MG tablet  3 times daily PRN     01/30/18 1823       If controlled substance prescribed during this visit, 12 month history viewed on the [place] prior to issuing an initial prescription for Schedule II or III opiod.    Victorino Dike, FNP 01/30/18 2332    Eula Listen, MD 02/08/18 1434

## 2018-01-30 NOTE — ED Triage Notes (Signed)
Pt to ED via POV with c/o neck pain after mechanical fall xfwe days ago. PT hx of herniated disc in neck. PT was sent form PCP for x-rays. PT also states she has headache since fall but denies any head injury with fall,. VSS . Pt ambulatory

## 2018-02-01 ENCOUNTER — Other Ambulatory Visit: Payer: Self-pay | Admitting: Family Medicine

## 2018-02-01 ENCOUNTER — Encounter: Payer: Self-pay | Admitting: Family Medicine

## 2018-02-02 ENCOUNTER — Encounter: Payer: Self-pay | Admitting: Family Medicine

## 2018-02-02 MED ORDER — ACYCLOVIR 800 MG PO TABS: 400.0000 mg | ORAL_TABLET | Freq: Every day | ORAL | 1 refills | Status: DC

## 2018-02-02 NOTE — Telephone Encounter (Signed)
Name of Medication: [place] Name of Pharmacy: [facility] or Written Date and Quantity: 08/31/17,  Last Office Visit and Type: 12/27/17, f/u Next Office Visit and Type: 04/05/18, f/u Last Controlled Substance Agreement Date: 03/14/17 Last UDS: 03/14/17

## 2018-02-02 NOTE — Telephone Encounter (Signed)
Eprescribed.

## 2018-02-08 ENCOUNTER — Encounter: Payer: Self-pay | Admitting: Gastroenterology

## 2018-02-20 ENCOUNTER — Other Ambulatory Visit: Payer: Self-pay | Admitting: Obstetrics and Gynecology

## 2018-02-20 NOTE — Telephone Encounter (Signed)
Medication refill request: yuvafem 10 mcg Last AEX:  01/17/18 Next AEX: 01/31/19 Last MMG (if hormonal medication request): 10/30/17 Bi-rads category 1 neg  Refill authorized: please advise

## 2018-02-22 ENCOUNTER — Encounter: Payer: Self-pay | Admitting: Gastroenterology

## 2018-02-22 ENCOUNTER — Ambulatory Visit (AMBULATORY_SURGERY_CENTER): Payer: Medicare Other | Admitting: Gastroenterology

## 2018-02-22 VITALS — BP 108/68 | HR 76 | Temp 97.8°F | Resp 13 | Ht 63.0 in | Wt 132.0 lb

## 2018-02-22 DIAGNOSIS — R195 Other fecal abnormalities: Secondary | ICD-10-CM | POA: Diagnosis not present

## 2018-02-22 HISTORY — PX: COLONOSCOPY: SHX174

## 2018-02-22 MED ORDER — SODIUM CHLORIDE 0.9 % IV SOLN
500.0000 mL | Freq: Once | INTRAVENOUS | Status: DC
Start: 2018-02-22 — End: 2018-06-26

## 2018-02-22 NOTE — Patient Instructions (Signed)
Continue present medications.     YOU HAD AN ENDOSCOPIC PROCEDURE TODAY AT [facility]:   Refer to the procedure report that was given to you for any specific questions about what was found during the examination.  If the procedure report does not answer your questions, please call your gastroenterologist to clarify.  If you requested that your care partner not be given the details of your procedure findings, then the procedure report has been included in a sealed envelope for you to review at your convenience later.  YOU SHOULD EXPECT: Some feelings of bloating in the abdomen. Passage of more gas than usual.  Walking can help get rid of the air that was put into your GI tract during the procedure and reduce the bloating. If you had a lower endoscopy (such as a colonoscopy or flexible sigmoidoscopy) you may notice spotting of blood in your stool or on the toilet paper. If you underwent a bowel prep for your procedure, you may not have a normal bowel movement for a few days.  Please Note:  You might notice some irritation and congestion in your nose or some drainage.  This is from the oxygen used during your procedure.  There is no need for concern and it should clear up in a day or so.  SYMPTOMS TO REPORT IMMEDIATELY:   Following lower endoscopy (colonoscopy or flexible sigmoidoscopy):  Excessive amounts of blood in the stool  Significant tenderness or worsening of abdominal pains  Swelling of the abdomen that is new, acute  Fever of 100F or higher     For urgent or emergent issues, a gastroenterologist can be reached at any hour by calling [phone].   DIET:  We do recommend a small meal at first, but then you may proceed to your regular diet.  Drink plenty of fluids but you should avoid alcoholic beverages for 24 hours.  ACTIVITY:  You should plan to take it easy for the rest of today and you should NOT DRIVE or use heavy machinery until tomorrow (because of the  sedation medicines used during the test).    FOLLOW UP: Our staff will call the number listed on your records the next business day following your procedure to check on you and address any questions or concerns that you may have regarding the information given to you following your procedure. If we do not reach you, we will leave a message.  However, if you are feeling well and you are not experiencing any problems, there is no need to return our call.  We will assume that you have returned to your regular daily activities without incident.  If any biopsies were taken you will be contacted by phone or by letter within the next 1-3 weeks.  Please call us at [phone] if you have not heard about the biopsies in 3 weeks.    SIGNATURES/CONFIDENTIALITY: You and/or your care partner have signed paperwork which will be entered into your electronic medical record.  These signatures attest to the fact that that the information above on your After Visit Summary has been reviewed and is understood.  Full responsibility of the confidentiality of this discharge information lies with you and/or your care-partner.

## 2018-02-22 NOTE — Op Note (Signed)
[facility] Patient Name: Rachel Duran Procedure Date: 02/22/2018 3:44 PM MRN: [id number] Endoscopist: [name] , MD Age: 62 Referring MD:  Date of Birth: September 24, 1955 Gender: Female Account #: [id number] Procedure:                Colonoscopy Indications:              Positive Cologuard test Medicines:                Monitored Anesthesia Care Procedure:                Pre-Anesthesia Assessment:                           - Prior to the procedure, a History and Physical                            was performed, and patient medications and                            allergies were reviewed. The patient's tolerance of                            previous anesthesia was also reviewed. The risks                            and benefits of the procedure and the sedation                            options and risks were discussed with the patient.                            All questions were answered, and informed consent                            was obtained. Prior Anticoagulants: The patient has                            taken no previous anticoagulant or antiplatelet                            agents. ASA Grade Assessment: III - A patient with                            severe systemic disease. After reviewing the risks                            and benefits, the patient was deemed in                            satisfactory condition to undergo the procedure.                           After obtaining informed consent, the colonoscope  was passed under direct vision. Throughout the                            procedure, the patient's blood pressure, pulse, and                            oxygen saturations were monitored continuously. The                            Model PCF-[id number]) scope was introduced                            through the anus and advanced to the the cecum,                            identified by appendiceal orifice  and ileocecal                            valve. The ileocecal valve, appendiceal orifice,                            and rectum were photographed. The quality of the                            bowel preparation was good. The colonoscopy was                            performed without difficulty. The patient tolerated                            the procedure well. Scope In: 3:48:53 PM Scope Out: 4:07:19 PM Scope Withdrawal Time: 0 hours 14 minutes 35 seconds  Total Procedure Duration: 0 hours 18 minutes 26 seconds  Findings:                 The perianal and digital rectal examinations were                            normal.                           The entire examined colon appeared normal on direct                            and retroflexion views. Complications:            No immediate complications. Estimated blood loss:                            None. Estimated Blood Loss:     Estimated blood loss: none. Impression:               - The entire examined colon is normal on direct and                            retroflexion views.                           -  No specimens collected. Recommendation:           - Repeat colonoscopy in 10 years for screening                            purposes.                           - Patient has a contact number available for                            emergencies. The signs and symptoms of potential                            delayed complications were discussed with the                            patient. Return to normal activities tomorrow.                            Written discharge instructions were provided to the                            patient.                           - Resume previous diet.                           - Continue present medications. [name], MD 02/22/2018 4:10:21 PM This report has been signed electronically.

## 2018-02-22 NOTE — Progress Notes (Signed)
Report given to PACU, vss 

## 2018-02-23 ENCOUNTER — Telehealth: Payer: Self-pay

## 2018-02-23 NOTE — Telephone Encounter (Signed)
  Follow up Call-  Call back number 02/22/2018 10/03/2016  Post procedure Call Back phone  # [phone]  Permission to leave phone message Yes Yes  Some recent data might be hidden     Patient questions:  Do you have a fever, pain , or abdominal swelling? No. Pain Score  0 *  Have you tolerated food without any problems? Yes.    Have you been able to return to your normal activities? Yes.    Do you have any questions about your discharge instructions: Diet   No. Medications  No. Follow up visit  No.  Do you have questions or concerns about your Care? No.  Actions: * If pain score is 4 or above: No action needed, pain <4.

## 2018-02-25 ENCOUNTER — Encounter: Payer: Self-pay | Admitting: Family Medicine

## 2018-03-15 NOTE — Progress Notes (Signed)
Office Visit Note  Patient: Rachel Duran             Date of Birth: 04/18/1956           MRN: [id number]             PCP: Ria Bush, MD Referring: Ria Bush, MD Visit Date: 03/29/2018 Occupation: @GUAROCC @  Subjective:  Pain in both knees   History of Present Illness: [name] is a 62 y.o. female with history of fibromyalgia, DDD, and osteoarthritis.  Patient reports today with bilateral knee pain, bilateral trochanteric bursitis, and bilateral hand pain.  She reports that she started having increased knee pain about 6 weeks ago.  She continues to take indomethacin for pain relief as well as using Voltaren gel.  She is currently on Lyrica 150 mg 3 times daily.  She states she unable to stand for prolonged periods of time due to the pain in both knee joints.  She reports chronic neck pain and back pain.  She continues to follow-up with Dr. [name].  She was evaluated by Dr. Maryjean Ka is going to have an epidural injection but is apprehensive due to the pain she experienced during the trial.  She states that she has been having severe pain at night that has been waking up 3-4 times per night.  She states that she sleeps up 4 hours on average.  She continues to have chronic fatigue.  She states that she wakes up several times a night to reposition.  She is having bilateral trochanteric bursitis at this time.  She states that at times she will experience a locking sensation in her hip that happens sporadically.  She denies any groin pain.  She continues to follow-up with [facility] physical therapy.  She has had 3 visits in the past 1 month.  She continues to have mild symptoms of [name].  She takes Norvasc 5 mg daily.     Activities of Daily Living:  Patient reports morning stiffness for 1.5-2 hours.   Patient Reports nocturnal pain.  Difficulty dressing/grooming: Reports Difficulty climbing stairs: Reports Difficulty getting out of chair: Denies Difficulty  using hands for taps, buttons, cutlery, and/or writing: Reports  Review of Systems  Constitutional: Positive for fatigue.  HENT: Positive for mouth dryness. Negative for mouth sores and nose dryness.   Eyes: Positive for dryness. Negative for pain and visual disturbance.  Respiratory: Negative for cough, hemoptysis, shortness of breath and difficulty breathing.   Cardiovascular: Negative for chest pain, palpitations, hypertension and swelling in legs/feet.  Gastrointestinal: Positive for constipation. Negative for blood in stool and diarrhea.  Endocrine: Negative for increased urination.  Genitourinary: Negative for painful urination.  Musculoskeletal: Positive for arthralgias, joint pain, joint swelling, myalgias, muscle weakness, morning stiffness and myalgias. Negative for muscle tenderness.  Skin: Negative for color change, pallor, rash, hair loss, nodules/bumps, skin tightness, ulcers and sensitivity to sunlight.  Allergic/Immunologic: Negative for susceptible to infections.  Neurological: Negative for dizziness, numbness, headaches and weakness.  Hematological: Negative for swollen glands.  Psychiatric/Behavioral: Positive for depressed mood and sleep disturbance. The patient is not nervous/anxious.     PMFS History:  Patient Active Problem List   Diagnosis Date Noted  . Dyslipidemia 09/22/2017  . DDD (degenerative disc disease), cervical 04/03/2017  . DDD (degenerative disc disease), lumbar 04/03/2017  . Onychomycosis 03/21/2017  . Dysphagia 10/18/2016  . Encounter for chronic pain management 10/18/2016  . Biliary stasis 09/26/2016  . Fever blister 08/18/2016  .  Urinary urgency 07/05/2016  . HNP (herniated nucleus pulposus), lumbar 09/23/2015  . Sinus congestion 07/30/2015  . Iron deficiency 07/30/2015  . Health maintenance examination 06/15/2015  . 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 Sinus headache 03/09/2015  . Clavicle enlargement 12/13/2014  . Prediabetes 12/13/2014  .  Plantar fasciitis, bilateral 09/11/2014  . Advanced care planning/counseling discussion 06/10/2014  . Medicare annual wellness visit, subsequent 06/10/2014  . Abnormal thyroid function test 06/10/2014  . Chronic pain syndrome 06/10/2014  . CFS (chronic fatigue syndrome) 04/08/2014  . Calculus of bile duct 09/17/2013  . Postmenopausal atrophic vaginitis 10/19/2012  . 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 Cervical disc disorder with radiculopathy of cervical region 05/28/2010  . Anemia of chronic disease 03/17/2009  . CHEST PAIN UNSPECIFIED 02/28/2008  . APHTHOUS ULCERS 01/31/2008  . INSOMNIA, CHRONIC 11/09/2007  . Drug-induced constipation 10/11/2007  . ALLERGIC RHINITIS 04/12/2007  . MDD (major depressive disorder), recurrent episode, moderate ([place]) 03/05/2007  . [name] syndrome 03/05/2007  . GERD 03/05/2007  . ROSACEA 03/05/2007  . NEURALGIA 03/05/2007  . 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 CYSTITIS, CHRONIC INTERSTITIAL 12/06/2006  . Fibromyalgia 12/06/2006    Past Medical History:  Diagnosis Date  . Abdominal pain last 4 months   and nausea also  . Allergy   . Anemia   . Anxiety   . 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 C6- C7 -hx. past fusion(bone graft used)  . Cholecystitis   . Chronic pain   . DDD (degenerative disc disease), lumbar 09/2015   dextroscoliosis with multilevel DDD and facet arthrosis most notable for R foraminal disc protrusion L4/5 producing severe R neural foraminal stenosis abutting R L4 nerve root, moderate spinal canal and mild lat recesss and R neural foraminal stenosis L3/4 (MRI)  . Depression    bipolar depression  . Disorders of porphyrin metabolism   . Felon of finger of left hand 11/10/2016  . Fibromyalgia   . GERD (gastroesophageal reflux disease)   . Internal hemorrhoids   . Interstitial cystitis 06-06-12   hx.  . Irritable bowel syndrome   . PONV (postoperative nausea and vomiting)    now  uses stomach blockers and no ponv  . 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 Raynauds disease    hx.  . Seronegative arthritis    [name]  . 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 CAD Father 33       MI, nonsmoker  . Esophageal cancer Father 57  . Stomach cancer Father   . Scleroderma Mother   . Hypertension Mother   . Esophageal cancer Paternal Grandfather   . Stomach cancer Paternal Grandfather   . Diabetes Maternal Grandmother   . Arthritis Brother   . Stroke Neg Hx   . Colon cancer Neg Hx   . Rectal cancer Neg Hx    Past Surgical History:  Procedure Laterality Date  . ANTERIOR CERVICAL DECOMP/DISCECTOMY FUSION  2004   C5/6, C6/7  . ANTERIOR CERVICAL DECOMP/DISCECTOMY FUSION  02/2016   C3/4, C4/5 with plating [name])  . AUGMENTATION MAMMAPLASTY Bilateral 03/25/2010  . BREAST ENHANCEMENT SURGERY  2010  . BREAST IMPLANT EXCHANGE  10/2014   exchange saline  implants, B mastopexy/capsulorraphy ([facility])  . BUNIONECTOMY Bilateral yrs ago  . [place]   x 1  . CHOLECYSTECTOMY  06/11/2012   Procedure: LAPAROSCOPIC CHOLECYSTECTOMY WITH INTRAOPERATIVE CHOLANGIOGRAM;  Surgeon: [name], MD,FACS;  Location: [facility];  Service: General;  Laterality: N/A;  . COLONOSCOPY  02/2018   done for positive cologuard - WNL, rpt 10 yrs [facility] Plan)  . CYSTOSCOPY    . ERCP  05/22/2012   Procedure: ENDOSCOPIC RETROGRADE CHOLANGIOPANCREATOGRAPHY (ERCP);  Surgeon: [name], MD,FACG;  Location: [facility];  Service: Endoscopy;  Laterality: N/A;  . ERCP N/A 09/17/2013   Procedure: ENDOSCOPIC RETROGRADE CHOLANGIOPANCREATOGRAPHY (ERCP);  Surgeon: [name], MD;  Location: [facility];  Service: Endoscopy;  Laterality: N/A;    . ESOPHAGOGASTRODUODENOSCOPY  09/2016   WNL. esophagus dilated [facility])  . HEMORRHOID BANDING  09-23-13   --Dr. Greer Pickerel  . HERNIA REPAIR     inguinal  . HYSTEROSCOPY W/ ENDOMETRIAL ABLATION    . NASAL SINUS SURGERY     x5  . 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 She appears well-developed and well-nourished.  HENT:  Head: Normocephalic and atraumatic.  Eyes: Conjunctivae and EOM are normal.  Neck: Normal range of motion.  Cardiovascular: Normal rate, regular rhythm, normal heart sounds and intact distal pulses.  Pulmonary/Chest: Effort normal and breath sounds normal.  Abdominal: Soft. Bowel sounds are normal.  Lymphadenopathy:    She has no cervical adenopathy.  Neurological: She is alert and oriented to person, place, and time.  Skin: Skin is warm and dry. Capillary refill takes less than 2 seconds.  Psychiatric: She has a normal mood and affect. Her behavior is normal.  Nursing note and vitals reviewed.    Musculoskeletal Exam: C-spine limited ROM with lateral rotation to the left.  Shoulder joints, elbow joints, wrist joints, MCPs, PIPs, and DIPs good ROM with no synovitis.  PIP and DIP synovial thickening consistent with osteoarthritis.  Hip joints, knee joints, ankle joints, MTPs, PIPs, and DIPs good ROM with no synovitis.  No warmth or effusion of knee  joints.  PIP and DIP synovial thickening consistent with osteoarthritis of both feet.  Tenderness of trochanteric bursa bilaterally.  18/18 tender points.    CDAI Exam: CDAI Score: Not documented Patient Global Assessment: Not documented; Provider Global Assessment: Not documented Swollen: Not documented; Tender: Not documented Joint Exam   Not documented   There is currently no information documented on the homunculus. Go to the Rheumatology activity and complete the homunculus joint exam.  Investigation: No additional findings.  Imaging: No results found.  Recent Labs: Lab Results  Component Value Date   WBC 9.4 09/13/2016   HGB 13.4 09/13/2016   PLT 298.0 09/13/2016  NA 139 09/21/2017   K 5.0 09/21/2017   CL 102 09/21/2017   CO2 25 09/21/2017   GLUCOSE 88 09/21/2017   BUN 8 09/21/2017   CREATININE 0.59 09/21/2017   BILITOT 0.3 03/21/2017   ALKPHOS 81 03/21/2017   AST 29 03/21/2017   ALT 31 03/21/2017   PROT 6.8 03/21/2017   ALBUMIN 4.5 03/21/2017   CALCIUM 9.8 09/21/2017   GFRAA >89 04/11/2013    Speciality Comments: No specialty comments available.  Procedures:  Large Joint Inj: bilateral knee on 03/29/2018 1:10 PM Indications: pain Details: 27 G 1.5 in needle, medial approach  Arthrogram: No  Medications (Right): 1.5 mL lidocaine 1 %; 40 mg triamcinolone acetonide 40 MG/ML Aspirate (Right): 0 mL Medications (Left): 1.5 mL lidocaine 1 %; 40 mg triamcinolone acetonide 40 MG/ML Aspirate (Left): 0 mL Outcome: tolerated well, no immediate complications Procedure, treatment alternatives, risks and benefits explained, specific risks discussed. Consent was given by the patient. Immediately prior to procedure a time out was called to verify the correct patient, procedure, equipment, support staff and site/side marked as required. Patient was prepped and draped in the usual sterile fashion.     Allergies: Codeine phosphate; Cymbalta [duloxetine hcl]; Celebrex  [celecoxib]; Inh [isoniazid]; Lithium; Nucynta [tapentadol]; Silenor [doxepin hcl]; Meperidine hcl; and Sulfamethoxazole   Assessment / Plan:     Visit Diagnoses: Fibromyalgia: She has generalized hyperalgesia due to fibromyalgia.  She is having generalized muscle aches muscle tenderness especially in her lower extremities currently.  She has been having a fibromyalgia flare for the past several weeks.  She has bilateral trochanteric bursitis.  She is also been having increased pain in both knee joints for the past 6 weeks.  She has very interrupted sleep at night due to the pain in her lower back.  She sleeps about 4 hours per night.  She has chronic fatigue related to her insomnia.  She has been taking indomethacin and tramadol for pain relief.  She also uses Voltaren gel as needed.  She continues to go to physical therapy with [facility] for trochanter bursitis and pelvic floor exercises.  She was encouraged to continue to exercise and perform stretching on a regular basis.  She will continue taking Lyrica 150 mg 3 times daily.  She does not need any refills at this time.  Chronic pain syndrome: She takes Tramadol and Indomethacin for pain relief.  She is also on Lyrica 150 mg TID.   CFS (chronic fatigue syndrome): Her fatigue has been worsening since she has been having a very interrupted sleep at night due to the level of pain she has been experiencing.  She was encouraged to stay active and perform light exercise on a regular basis.    DDD (degenerative disc disease), cervical: Chronic pain. She has limited ROM with lateral rotation to the left.  She continues to follow up with Dr. [name].   DDD (degenerative disc disease), lumbar:  Chronic pain.  She sees Dr. [name] and Dr. Maryjean Ka.   Chronic pain of both knees: She has been having increased pain in both knees for the past 6 weeks.  She has no warmth or effusion of knee joints.  She requested bilateral cortisone injections.  She tolerated  the procedure well.   Primary osteoarthritis of both knees: No warmth or effusion.  She has good ROM bilaterally.  X-rays of both knees were obtained on 04/10/17 that revealed moderate OA. She requested cortisone injections bilaterally.  She tolerated the procedure well.  Potential  side effects were discussed.  Joint protection and muscle strengthening were discussed.   [name] disease without gangrene: She has mild intermittent symptoms of [name].  No digital ulcerations or signs of gangrene noted.  She takes Norvasc 5 mg po daily.   Trochanteric bursitis, bilateral: She has tenderness of bilateral trochanteric bursa.  She continues to go to PT Ileana Roup) on a regular basis.  She performs stretching exercises on a regular basis.   Plantar fasciitis, bilateral: Resolved.  She has no tenderness at this time.   Other medical conditions are listed as follows:  H/O oral aphthous ulcers  Prediabetes  MDD (major depressive disorder), recurrent episode, moderate ([facility])  History of gastroesophageal reflux (GERD)  Hx of porphyria  History of bipolar disorder   Orders: Orders Placed This Encounter  Procedures  . Large Joint Inj   No orders of the defined types were placed in this encounter.   Face-to-face time spent with patient was 30 minutes. Greater than 50% of time was spent in counseling and coordination of care.  Follow-Up Instructions: Return in about 6 months (around 09/27/2018) for Fibromyalgia, Osteoarthritis, DDD.   Ofilia Neas, PA-C  Note - This record has been created using Dragon software.  Chart creation errors have been sought, but may not always  have been located. Such creation errors do not reflect on  the standard of medical care.

## 2018-03-20 ENCOUNTER — Other Ambulatory Visit: Payer: Self-pay | Admitting: Family Medicine

## 2018-03-20 NOTE — Telephone Encounter (Signed)
Name of Medication: Adderall XR Name of Pharmacy: [facility] or Written Date and Quantity: 12/14/17,  #90 Last Office Visit and Type: 12/27/17, pain f/u Next Office Visit and Type: 04/10/18, f/u Last Controlled Substance Agreement Date: 03/14/17 Last UDS: 03/14/17

## 2018-03-22 NOTE — Telephone Encounter (Signed)
Eprescribed.

## 2018-03-27 ENCOUNTER — Telehealth: Payer: Self-pay | Admitting: *Deleted

## 2018-03-27 ENCOUNTER — Other Ambulatory Visit: Payer: Self-pay | Admitting: Family Medicine

## 2018-03-27 NOTE — Telephone Encounter (Signed)
Received refill request from [facility] for patient's Indomethacin Gel. Contacted pharmacy and gave verbal authorization for refill.  Last Visit: 09/26/17 Next Visit: 03/29/18  Okay to refill per Dr. Estanislado Pandy

## 2018-03-28 ENCOUNTER — Telehealth: Payer: Self-pay | Admitting: Rheumatology

## 2018-03-28 NOTE — Telephone Encounter (Signed)
[place] and gave verbal authorization for refill of indomethacin. Advised patient this refill has been called in to the correct pharmacy.

## 2018-03-28 NOTE — Telephone Encounter (Signed)
Patient called back stating she forgot to mention that she is going out of town on Saturday so she needs this prescription sent in this morning.

## 2018-03-28 NOTE — Telephone Encounter (Signed)
Eprescribed.

## 2018-03-28 NOTE — Telephone Encounter (Signed)
Patient left a voicemail stating that her prescription of Indomethacin was sent to [facility].  Patient states this is a compound medicine that can only be filled by [facility] in [place].  Patient states she will run out and it takes 2 days to make up the compound.  Please fax the prescription to [facility] in [place] not [facility] in [place].

## 2018-03-29 ENCOUNTER — Ambulatory Visit (INDEPENDENT_AMBULATORY_CARE_PROVIDER_SITE_OTHER): Payer: Medicare Other | Admitting: Physician Assistant

## 2018-03-29 ENCOUNTER — Encounter: Payer: Self-pay | Admitting: Physician Assistant

## 2018-03-29 VITALS — BP 125/82 | HR 94 | Ht 64.0 in | Wt 134.6 lb

## 2018-03-29 DIAGNOSIS — M170 Bilateral primary osteoarthritis of knee: Secondary | ICD-10-CM | POA: Diagnosis not present

## 2018-03-29 DIAGNOSIS — G8929 Other chronic pain: Secondary | ICD-10-CM

## 2018-03-29 DIAGNOSIS — R5382 Chronic fatigue, unspecified: Secondary | ICD-10-CM

## 2018-03-29 DIAGNOSIS — M5136 Other intervertebral disc degeneration, lumbar region: Secondary | ICD-10-CM

## 2018-03-29 DIAGNOSIS — M797 Fibromyalgia: Secondary | ICD-10-CM | POA: Diagnosis not present

## 2018-03-29 DIAGNOSIS — M722 Plantar fascial fibromatosis: Secondary | ICD-10-CM

## 2018-03-29 DIAGNOSIS — I7300 Raynaud's syndrome without gangrene: Secondary | ICD-10-CM

## 2018-03-29 DIAGNOSIS — M5030 Other cervical disc degeneration, unspecified cervical region: Secondary | ICD-10-CM

## 2018-03-29 DIAGNOSIS — F331 Major depressive disorder, recurrent, moderate: Secondary | ICD-10-CM

## 2018-03-29 DIAGNOSIS — M7062 Trochanteric bursitis, left hip: Secondary | ICD-10-CM

## 2018-03-29 DIAGNOSIS — G894 Chronic pain syndrome: Secondary | ICD-10-CM

## 2018-03-29 DIAGNOSIS — M25562 Pain in left knee: Secondary | ICD-10-CM

## 2018-03-29 DIAGNOSIS — M25561 Pain in right knee: Secondary | ICD-10-CM

## 2018-03-29 DIAGNOSIS — G9332 Myalgic encephalomyelitis/chronic fatigue syndrome: Secondary | ICD-10-CM

## 2018-03-29 DIAGNOSIS — Z8639 Personal history of other endocrine, nutritional and metabolic disease: Secondary | ICD-10-CM

## 2018-03-29 DIAGNOSIS — M51369 Other intervertebral disc degeneration, lumbar region without mention of lumbar back pain or lower extremity pain: Secondary | ICD-10-CM

## 2018-03-29 DIAGNOSIS — R7303 Prediabetes: Secondary | ICD-10-CM

## 2018-03-29 DIAGNOSIS — Z8719 Personal history of other diseases of the digestive system: Secondary | ICD-10-CM

## 2018-03-29 DIAGNOSIS — Z8659 Personal history of other mental and behavioral disorders: Secondary | ICD-10-CM

## 2018-03-29 MED ORDER — LIDOCAINE HCL 1 % IJ SOLN
1.5000 mL | INTRAMUSCULAR | Status: AC | PRN
Start: 2018-03-29 — End: 2018-03-29
  Administered 2018-03-29: 1.5 mL

## 2018-03-29 MED ORDER — TRIAMCINOLONE ACETONIDE 40 MG/ML IJ SUSP
40.0000 mg | INTRAMUSCULAR | Status: AC | PRN
  Administered 2018-03-29: 40 mg via INTRA_ARTICULAR

## 2018-03-29 MED ORDER — LIDOCAINE HCL 1 % IJ SOLN
1.5000 mL | INTRAMUSCULAR | Status: AC | PRN
  Administered 2018-03-29: 1.5 mL

## 2018-04-05 ENCOUNTER — Ambulatory Visit: Payer: Medicare Other | Admitting: Family Medicine

## 2018-04-10 ENCOUNTER — Ambulatory Visit (INDEPENDENT_AMBULATORY_CARE_PROVIDER_SITE_OTHER): Payer: Medicare Other | Admitting: Family Medicine

## 2018-04-10 ENCOUNTER — Encounter: Payer: Self-pay | Admitting: Family Medicine

## 2018-04-10 VITALS — BP 118/76 | HR 95 | Temp 99.0°F | Ht 64.0 in | Wt 128.0 lb

## 2018-04-10 DIAGNOSIS — G4700 Insomnia, unspecified: Secondary | ICD-10-CM

## 2018-04-10 DIAGNOSIS — H3553 Other dystrophies primarily involving the sensory retina: Secondary | ICD-10-CM

## 2018-04-10 DIAGNOSIS — G894 Chronic pain syndrome: Secondary | ICD-10-CM

## 2018-04-10 DIAGNOSIS — Z23 Encounter for immunization: Secondary | ICD-10-CM

## 2018-04-10 DIAGNOSIS — M5030 Other cervical disc degeneration, unspecified cervical region: Secondary | ICD-10-CM

## 2018-04-10 DIAGNOSIS — M797 Fibromyalgia: Secondary | ICD-10-CM

## 2018-04-10 DIAGNOSIS — M5136 Other intervertebral disc degeneration, lumbar region: Secondary | ICD-10-CM

## 2018-04-10 DIAGNOSIS — G8929 Other chronic pain: Secondary | ICD-10-CM

## 2018-04-10 DIAGNOSIS — R51 Headache: Secondary | ICD-10-CM

## 2018-04-10 DIAGNOSIS — R519 Headache, unspecified: Secondary | ICD-10-CM

## 2018-04-10 DIAGNOSIS — K5903 Drug induced constipation: Secondary | ICD-10-CM

## 2018-04-10 DIAGNOSIS — M5010 Cervical disc disorder with radiculopathy, unspecified cervical region: Secondary | ICD-10-CM

## 2018-04-10 DIAGNOSIS — I7300 Raynaud's syndrome without gangrene: Secondary | ICD-10-CM

## 2018-04-10 MED ORDER — LINACLOTIDE 290 MCG PO CAPS: 290.0000 ug | ORAL_CAPSULE | Freq: Every day | ORAL | 3 refills | Status: DC

## 2018-04-10 MED ORDER — ESZOPICLONE 3 MG PO TABS: 3.0000 mg | ORAL_TABLET | Freq: Every day | ORAL | 0 refills | Status: DC

## 2018-04-10 MED ORDER — CYCLOBENZAPRINE HCL 5 MG PO TABS: 5.0000 mg | ORAL_TABLET | Freq: Three times a day (TID) | ORAL | 1 refills | Status: DC | PRN

## 2018-04-10 NOTE — Assessment & Plan Note (Signed)
Rachel Duran CSRS reviewed and appropriate.

## 2018-04-10 NOTE — Patient Instructions (Addendum)
Flu shot today. I will refill flexeril 5mg  to use as needed for neck muscle pain/spasm.  Try to back off high dose aspirin, try flexeril in its stead for headaches/migraines.   Return in 3 months for wellness visit and physical

## 2018-04-10 NOTE — Progress Notes (Signed)
BP 118/76 (BP Location: Left Arm, Patient Position: Sitting, Cuff Size: Normal)   Pulse 95   Temp 99 F (37.2 C) (Oral)   Ht 5\' 4"  (1.626 m)   Wt 128 lb (58.1 kg)   LMP 07/25/1998   SpO2 96%   BMI 21.97 kg/m    CC: 3 mo f/u visit Subjective:    Patient ID: Rachel Duran, female    DOB: 1956/02/18, 62 y.o.   MRN: [id number]  HPI: Rachel Duran is a 62 y.o. female presenting on 04/10/2018 for Pain Management (Here for 3 mo pain f/u. Was given rx for Flexeril at ER in 01/2018 for injury to neck and back. Requesting a new rx. )   Recent records reviewed. Saw obgyn for annual well woman exam. Saw rheum and GI. Had bilateral knee steroid injections with some benefit as well as reassuring colonoscopy (after positive cologuard). Recent fall s/p ER evaluation diagnosed with cervical strain, treated with flexeril 5mg  with good effect. Requests refill today.   Chronic pain stemming from known cervical and lumbar DDD. Current regimen is embeda 80/3.2mg  daily #30/month with tramadol #40 PRN (last filled 05/2017). Tolerates medication well. Also uses indomethacin cream or voltaren gel PRN. Constipation controlled with linzess, miralax and fiber supplement.   Increasing bruising, doesn't remember injuries.  Persistent headaches associated with nausea worse over last 4-5 weeks. Manages this with aspirin 325-650mg  at a time. She also takes aspirin 81mg  every other day.   Relevant past medical, surgical, family and social history reviewed and updated as indicated. Interim medical history since our last visit reviewed. Allergies and medications reviewed and updated. Outpatient Medications Prior to Visit  Medication Sig Dispense Refill  . acyclovir (ZOVIRAX) 800 MG tablet Take 0.5 tablets (400 mg total) by mouth daily. 45 tablet 1  . ALPRAZolam (XANAX) 1 MG tablet Take 1 tablet (1 mg total) by mouth 3 (three) times daily as needed for anxiety. With 1/2 tab at night time  0  . amLODipine  (NORVASC) 5 MG tablet Take 1 tablet (5 mg total) by mouth daily. 90 tablet 3  . amphetamine-dextroamphetamine (ADDERALL XR) 30 MG 24 hr capsule TAKE 1 CAPSULE EVERY MORNING 90 capsule 0  . ARIPiprazole (ABILIFY) 5 MG tablet Take 5 mg by mouth daily.     Marland Kitchen aspirin EC 81 MG tablet Take 81 mg by mouth every other day.     . Calcium-Magnesium-Vitamin D 938-10-175 MG-MG-UNIT TB24 Take 1 tablet by mouth 2 (two) times daily.    . cyanocobalamin (,VITAMIN B-12,) 1000 MCG/ML injection INJECT 1ML INTRAMUSCULARLY EVERY 21 DAYS(DISCARD 28 DAYS AFER FIRST USE) 3 mL 1  . diclofenac sodium (VOLTAREN) 1 % GEL Apply three grams to three large joints up to three times a day 3 Tube 3  . EMBEDA 80-3.2 MG CPCR TAKE ONE TABLET BY MOUTH EVERY DAY 30 capsule 0  . esomeprazole (NEXIUM) 40 MG capsule TAKE 1 CAPSULE BY MOUTH TWICE DAILY BEFORE A MEAL 60 capsule 11  . ferrous sulfate 325 (65 FE) MG tablet Take 325 mg by mouth 3 (three) times a week.    . fluticasone (FLONASE) 50 MCG/ACT nasal spray USE 2 SPRAYS IN BOTH NOSTRILS DAILY 16 g 5  . lamoTRIgine (LAMICTAL) 200 MG tablet Take 200 mg by mouth daily.    Marland Kitchen LYRICA 150 MG capsule TAKE 1 CAPSULE BY MOUTH 3  TIMES DAILY 270 capsule 0  . magic mouthwash w/lidocaine SOLN Take 5 mLs by mouth 3 (three) times daily  as needed for mouth pain. Benadryl/viscous lidocaine 2%/maalox 120 mL 0  . MAGNESIUM PO Take 1,000 mg by mouth 3 (three) times daily.    . Manganese 50 MG TABS Take 50 mg by mouth daily.     . Multiple Vitamins-Minerals (ICAPS AREDS 2) CAPS Take 1 capsule by mouth daily.    . NONFORMULARY OR COMPOUNDED ITEM Testosterone propionate 2% in white petrolatum, apply small amount once daily for 5 days per week. 60 each 1  . ondansetron (ZOFRAN) 4 MG tablet TAKE 1 TABLET BY MOUTH EVERY 8 HOURS AS NEEDED FOR NAUSEA OR VOMITING 30 tablet 0  . pentosan polysulfate (ELMIRON) 100 MG capsule Take 100 mg by mouth 3 (three) times daily.    . phenazopyridine (PYRIDIUM) 100 MG tablet  Take 1 tablet (100 mg total) by mouth 3 (three) times daily as needed for pain. 20 tablet 0  . Polyethylene Glycol 3350 (MIRALAX PO) Take 17 g by mouth 4 (four) times daily.     . SF 5000 PLUS 1.1 % CREA dental cream     . traMADol (ULTRAM) 50 MG tablet Take 1 tablet (50 mg total) by mouth 2 (two) times daily as needed. 40 tablet 0  . ursodiol (ACTIGALL) 300 MG capsule Take 1 capsule (300 mg total) by mouth 2 (two) times daily. 60 capsule 11  . cyclobenzaprine (FLEXERIL) 5 MG tablet Take 1 tablet (5 mg total) by mouth 3 (three) times daily as needed for muscle spasms. 30 tablet 0  . Eszopiclone 3 MG TABS TAKE ONE TABLET AT BEDTIME 90 tablet 0  . linaclotide (LINZESS) 290 MCG CAPS capsule Take 1 capsule (290 mcg total) by mouth daily. 90 capsule 3  . metoCLOPramide (REGLAN) 10 MG tablet Take 1 tablet (10 mg total) by mouth as directed. 2 tablet 0   Facility-Administered Medications Prior to Visit  Medication Dose Route Frequency Provider Last Rate Last Dose  . 0.9 %  sodium chloride infusion  500 mL Intravenous Once [name], MD         Per HPI unless specifically indicated in ROS section below Review of Systems     Objective:    BP 118/76 (BP Location: Left Arm, Patient Position: Sitting, Cuff Size: Normal)   Pulse 95   Temp 99 F (37.2 C) (Oral)   Ht 5\' 4"  (1.626 m)   Wt 128 lb (58.1 kg)   LMP 07/25/1998   SpO2 96%   BMI 21.97 kg/m   Wt Readings from Last 3 Encounters:  04/10/18 128 lb (58.1 kg)  03/29/18 134 lb 9.6 oz (61.1 kg)  02/22/18 132 lb (59.9 kg)    Physical Exam  Constitutional: She appears well-developed and well-nourished. No distress.  HENT:  Mouth/Throat: Oropharynx is clear and moist. No oropharyngeal exudate.  Neck:  Midline cervical spine and paraspinous and trap mm tenderness to palpation No pain at occiput   Cardiovascular: Normal rate, regular rhythm and normal heart sounds.  No murmur heard. Pulmonary/Chest: Effort normal and breath sounds  normal. No stridor. No respiratory distress. She has no rales.  Musculoskeletal: She exhibits no edema.  Psychiatric: She has a normal mood and affect.  Nursing note and vitals reviewed.  Results for orders placed or performed in visit on 11/22/17  Rubeola antibody IgG  Result Value Ref Range   Rubeola IgG <25.00 (L) AU/mL   Lab Results  Component Value Date   TSH 1.78 06/21/2017       Assessment & Plan:  Problem List Items Addressed This Visit    Stargardt's disease    Upcoming f/u appt with ophtho 05/2018      [name] syndrome    Discussed aspirin use. She is also on norvasc for this.       INSOMNIA, CHRONIC    lunesta refilled today.       Headache    Associated with nausea. No documented h/o migraines. Discussed trial flexeril PRN abortively, rec back off aspirin (due to increased bruising noted)      Relevant Medications   cyclobenzaprine (FLEXERIL) 5 MG tablet   Fibromyalgia    Appreciate rheum care.       Encounter for chronic pain management - Primary    [place] CSRS reviewed and appropriate.       Drug-induced constipation    Continue linzess, miralax, fiber supplementation      DDD (degenerative disc disease), lumbar   Relevant Medications   cyclobenzaprine (FLEXERIL) 5 MG tablet   DDD (degenerative disc disease), cervical   Relevant Medications   cyclobenzaprine (FLEXERIL) 5 MG tablet   Chronic pain syndrome   Cervical disc disorder with radiculopathy of cervical region    Refilled flexeril after recent cervical strain after she fell       Other Visit Diagnoses    Need for influenza vaccination       Relevant Orders   Flu Vaccine QUAD 36+ mos IM (Completed)       Meds ordered this encounter  Medications  . Eszopiclone 3 MG TABS    Sig: Take 1 tablet (3 mg total) by mouth at bedtime. Take immediately before bedtime    Dispense:  90 tablet    Refill:  0  . linaclotide (LINZESS) 290 MCG CAPS capsule    Sig: Take 1 capsule (290 mcg total)  by mouth daily.    Dispense:  90 capsule    Refill:  3  . cyclobenzaprine (FLEXERIL) 5 MG tablet    Sig: Take 1 tablet (5 mg total) by mouth 3 (three) times daily as needed for muscle spasms (sedation precautions).    Dispense:  30 tablet    Refill:  1   Orders Placed This Encounter  Procedures  . Flu Vaccine QUAD 36+ mos IM    Follow up plan: Return in about 3 months (around 07/10/2018) for annual exam, prior fasting for blood work, medicare wellness visit.  Ria Bush, MD

## 2018-04-11 NOTE — Assessment & Plan Note (Signed)
Refilled flexeril after recent cervical strain after she fell

## 2018-04-11 NOTE — Assessment & Plan Note (Signed)
Upcoming f/u appt with ophtho 05/2018

## 2018-04-11 NOTE — Assessment & Plan Note (Signed)
Discussed aspirin use. She is also on norvasc for this.

## 2018-04-11 NOTE — Assessment & Plan Note (Signed)
Associated with nausea. No documented h/o migraines. Discussed trial flexeril PRN abortively, rec back off aspirin (due to increased bruising noted)

## 2018-04-11 NOTE — Assessment & Plan Note (Addendum)
Continue linzess, miralax, fiber supplementation

## 2018-04-11 NOTE — Assessment & Plan Note (Signed)
Appreciate rheum care.

## 2018-04-11 NOTE — Assessment & Plan Note (Signed)
lunesta refilled today.

## 2018-05-02 ENCOUNTER — Other Ambulatory Visit: Payer: Self-pay | Admitting: Family Medicine

## 2018-05-02 NOTE — Telephone Encounter (Signed)
Eprescribed.

## 2018-05-02 NOTE — Telephone Encounter (Signed)
Name of Medication: Embeda Name of Pharmacy: [facility] or Written Date and Quantity: 03/28/18, #30 Last Office Visit and Type: 04/10/18, pain f/u Next Office Visit and Type: 07/04/18, CPE Last Controlled Substance Agreement Date: 03/14/17 Last UDS: 03/14/17

## 2018-05-10 ENCOUNTER — Encounter: Payer: Self-pay | Admitting: Obstetrics and Gynecology

## 2018-05-11 ENCOUNTER — Telehealth: Payer: Self-pay | Admitting: Rheumatology

## 2018-05-11 ENCOUNTER — Other Ambulatory Visit: Payer: Self-pay | Admitting: Obstetrics and Gynecology

## 2018-05-11 MED ORDER — NONFORMULARY OR COMPOUNDED ITEM: 1 refills | Status: DC

## 2018-05-11 MED ORDER — ESTRADIOL 10 MCG VA TABS: 1.0000 | ORAL_TABLET | VAGINAL | 9 refills | Status: DC

## 2018-05-11 NOTE — Telephone Encounter (Signed)
Last Visit: 03/29/18 Next Visit 09/27/18  Okay to refill per Dr. Gwenlyn Found to [facility] Indomethacin 10 % Gel Apply to affected Area up to twice daily. Dispense 60 mL with 1 refill.

## 2018-05-11 NOTE — Telephone Encounter (Signed)
Vagifem rx sent to pharmacy per office note.

## 2018-05-11 NOTE — Telephone Encounter (Signed)
Patient sent the following correspondence through [facility]. Routing to refill pool to assist patient with request.  Hello Dr. Quincy Simmonds, hope this finds you well. It is time for me to refill my 90 day supply of my generic Vagifem Merril Abbe). Would you please send a new prescription to my local pharmacy, which is [place], [place]. As probably noted in my chart I am no longer using [facility] and prefer to get my prescriptions locally.     If there are any questions, please don't hesitate to contact me. Thank you so very much!    Rachel Duran

## 2018-05-11 NOTE — Telephone Encounter (Signed)
Patient needs a refill on Indomethacin 10% gel. Patient needs rx sent to [facility] on [address]. Do not send to [facility].

## 2018-05-31 ENCOUNTER — Other Ambulatory Visit: Payer: Self-pay | Admitting: Family Medicine

## 2018-05-31 NOTE — Telephone Encounter (Signed)
Name of Medication: Embeda Name of Pharmacy: [facility] or Written Date and Quantity: 05/02/18, #30/0 Last Office Visit and Type: 04/10/18, pain management f/u Next Office Visit and Type: 07/04/18, CPE Last Controlled Substance Agreement Date: 03/14/17 Last UDS: 03/14/18

## 2018-06-01 ENCOUNTER — Encounter: Payer: Self-pay | Admitting: Family Medicine

## 2018-06-02 NOTE — Telephone Encounter (Signed)
Eprescribed.

## 2018-06-11 ENCOUNTER — Other Ambulatory Visit: Payer: Self-pay | Admitting: Family Medicine

## 2018-06-11 NOTE — Telephone Encounter (Signed)
Name of Medication: Adderall XR Name of Pharmacy: [facility] or Written Date and Quantity: 03/23/18, #90/0 Last Office Visit and Type: 04/10/18, pain f/u Next Office Visit and Type: 07/04/18, CPE Last Controlled Substance Agreement Date: 03/14/17 Last UDS: 03/14/17

## 2018-06-13 NOTE — Telephone Encounter (Signed)
Eprescribed.

## 2018-06-26 ENCOUNTER — Other Ambulatory Visit: Payer: Self-pay | Admitting: Family Medicine

## 2018-06-26 DIAGNOSIS — E611 Iron deficiency: Secondary | ICD-10-CM

## 2018-06-26 DIAGNOSIS — E785 Hyperlipidemia, unspecified: Secondary | ICD-10-CM

## 2018-06-26 DIAGNOSIS — R7303 Prediabetes: Secondary | ICD-10-CM

## 2018-06-26 DIAGNOSIS — R946 Abnormal results of thyroid function studies: Secondary | ICD-10-CM

## 2018-06-26 DIAGNOSIS — D638 Anemia in other chronic diseases classified elsewhere: Secondary | ICD-10-CM

## 2018-06-28 ENCOUNTER — Ambulatory Visit (INDEPENDENT_AMBULATORY_CARE_PROVIDER_SITE_OTHER): Payer: Medicare Other

## 2018-06-28 ENCOUNTER — Telehealth: Payer: Self-pay

## 2018-06-28 VITALS — BP 130/80 | HR 82 | Temp 98.4°F | Ht 64.5 in | Wt 131.2 lb

## 2018-06-28 DIAGNOSIS — D638 Anemia in other chronic diseases classified elsewhere: Secondary | ICD-10-CM | POA: Diagnosis not present

## 2018-06-28 DIAGNOSIS — E611 Iron deficiency: Secondary | ICD-10-CM

## 2018-06-28 DIAGNOSIS — Z0000 Encounter for general adult medical examination without abnormal findings: Secondary | ICD-10-CM

## 2018-06-28 DIAGNOSIS — R946 Abnormal results of thyroid function studies: Secondary | ICD-10-CM

## 2018-06-28 DIAGNOSIS — R7303 Prediabetes: Secondary | ICD-10-CM | POA: Diagnosis not present

## 2018-06-28 DIAGNOSIS — E785 Hyperlipidemia, unspecified: Secondary | ICD-10-CM | POA: Diagnosis not present

## 2018-06-28 LAB — CBC WITH DIFFERENTIAL/PLATELET
Basophils Absolute: 0 10*3/uL (ref 0.0–0.1)
Basophils Relative: 0.6 % (ref 0.0–3.0)
Eosinophils Absolute: 0.1 10*3/uL (ref 0.0–0.7)
Eosinophils Relative: 1.6 % (ref 0.0–5.0)
HCT: 40.7 % (ref 36.0–46.0)
Hemoglobin: 13.6 g/dL (ref 12.0–15.0)
LYMPHS PCT: 36.9 % (ref 12.0–46.0)
Lymphs Abs: 2.6 10*3/uL (ref 0.7–4.0)
MCHC: 33.5 g/dL (ref 30.0–36.0)
MCV: 86.3 fl (ref 78.0–100.0)
MONOS PCT: 7.3 % (ref 3.0–12.0)
Monocytes Absolute: 0.5 10*3/uL (ref 0.1–1.0)
NEUTROS PCT: 53.6 % (ref 43.0–77.0)
Neutro Abs: 3.8 10*3/uL (ref 1.4–7.7)
Platelets: 329 10*3/uL (ref 150.0–400.0)
RBC: 4.71 Mil/uL (ref 3.87–5.11)
RDW: 14 % (ref 11.5–15.5)
WBC: 7.1 10*3/uL (ref 4.0–10.5)

## 2018-06-28 LAB — FERRITIN: Ferritin: 78.4 ng/mL (ref 10.0–291.0)

## 2018-06-28 LAB — IBC PANEL
Iron: 77 ug/dL (ref 42–145)
Saturation Ratios: 18.2 % — ABNORMAL LOW (ref 20.0–50.0)
Transferrin: 303 mg/dL (ref 212.0–360.0)

## 2018-06-28 LAB — TSH: TSH: 2.75 u[IU]/mL (ref 0.35–4.50)

## 2018-06-28 LAB — LIPID PANEL
Cholesterol: 215 mg/dL — ABNORMAL HIGH (ref 0–200)
HDL: 65.3 mg/dL (ref >39.00)
LDL Cholesterol: 124 mg/dL — ABNORMAL HIGH (ref 0–99)
NonHDL: 149.23
Total CHOL/HDL Ratio: 3
Triglycerides: 124 mg/dL (ref 0.0–149.0)
VLDL: 24.8 mg/dL (ref 0.0–40.0)

## 2018-06-28 LAB — COMPREHENSIVE METABOLIC PANEL
ALT: 20 U/L (ref 0–35)
AST: 23 U/L (ref 0–37)
Albumin: 4.6 g/dL (ref 3.5–5.2)
Alkaline Phosphatase: 90 U/L (ref 39–117)
BUN: 9 mg/dL (ref 6–23)
CO2: 30 mEq/L (ref 19–32)
Calcium: 9.4 mg/dL (ref 8.4–10.5)
Chloride: 101 mEq/L (ref 96–112)
Creatinine, Ser: 0.62 mg/dL (ref 0.40–1.20)
GFR: 104.21 mL/min (ref >60.00)
Glucose, Bld: 116 mg/dL — ABNORMAL HIGH (ref 70–99)
POTASSIUM: 4.5 meq/L (ref 3.5–5.1)
SODIUM: 137 meq/L (ref 135–145)
Total Bilirubin: 0.5 mg/dL (ref 0.2–1.2)
Total Protein: 6.9 g/dL (ref 6.0–8.3)

## 2018-06-28 LAB — HEMOGLOBIN A1C: Hgb A1c MFr Bld: 6.1 % (ref 4.6–6.5)

## 2018-06-28 LAB — T4, FREE: Free T4: 0.74 ng/dL (ref 0.60–1.60)

## 2018-06-28 MED ORDER — ONDANSETRON 4 MG PO TBDP: 4.0000 mg | ORAL_TABLET | Freq: Three times a day (TID) | ORAL | 1 refills | Status: DC | PRN

## 2018-06-28 NOTE — Telephone Encounter (Signed)
Sublingual zofran [facility]

## 2018-06-28 NOTE — Progress Notes (Signed)
Subjective:   Rachel Duran is a 62 y.o. female who presents for Medicare Annual (Subsequent) preventive examination.  Review of Systems:  N/A       Objective:     Vitals: BP 130/80 (BP Location: Right Arm, Patient Position: Sitting, Cuff Size: Normal)   Pulse 82   Temp 98.4 F (36.9 C) (Oral)   Ht 5' 4.5" (1.638 m) Comment: shoes  Wt 131 lb 4 oz (59.5 kg)   LMP 07/25/1998   SpO2 97%   BMI 22.18 kg/m   Body mass index is 22.18 kg/m.  Advanced Directives 06/28/2018 06/21/2017 01/17/2017 09/17/2013 09/04/2013 06/11/2012 06/06/2012  Does Patient Have a Medical Advance Directive? Yes Yes Yes - Patient has advance directive, copy not in chart Patient does not have advance directive Patient does not have advance directive  Type of Advance Directive [place];Living will [place];Living will Living will;Healthcare Power of [place] will - -  Does patient want to make changes to medical advance directive? - - - - No change requested - -  Copy of [place] in Chart? Yes - validated most recent copy scanned in chart (See row information) No - copy requested - - - - -  Pre-existing out of facility DNR order (yellow form or pink MOST form) - - - No - No No    Tobacco Social History   Tobacco Use  Smoking Status Never Smoker  Smokeless Tobacco Never Used     Counseling given: No   Clinical Intake:  Pre-visit preparation completed: Yes  Pain : 0-10 Pain Score: 8      Nutritional Risks: None Diabetes: No  How often do you need to have someone help you when you read instructions, pamphlets, or other written materials from your doctor or pharmacy?: 1 - Never What is the last grade level you completed in school?: 12th grade + 1 yr college  Interpreter Needed?: No  Comments: pt lives with spouse Information entered by :: LPinson, LPN  Past Medical History:  Diagnosis Date  . Abdominal pain last 4 months    and nausea also  . Allergy   . Anemia   . Anxiety   . 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 C6- C7 -hx. past fusion(bone graft used)  . Cholecystitis   . Chronic pain   . DDD (degenerative disc disease), lumbar 09/2015   dextroscoliosis with multilevel DDD and facet arthrosis most notable for R foraminal disc protrusion L4/5 producing severe R neural foraminal stenosis abutting R L4 nerve root, moderate spinal canal and mild lat recesss and R neural foraminal stenosis L3/4 (MRI)  . Depression    bipolar depression  . Disorders of porphyrin metabolism   . Felon of finger of left hand 11/10/2016  . Fibromyalgia   . GERD (gastroesophageal reflux disease)   . Internal hemorrhoids   . Interstitial cystitis 06-06-12   hx.  . Irritable bowel syndrome   . PONV (postoperative nausea and vomiting)    now uses stomach blockers and no ponv  . 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 Raynauds disease    hx.  . Seronegative arthritis    [name]  . 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 ANTERIOR CERVICAL DECOMP/DISCECTOMY FUSION  2004   C5/6, C6/7  . ANTERIOR CERVICAL DECOMP/DISCECTOMY FUSION  02/2016   C3/4, C4/5 with plating [name])  . AUGMENTATION MAMMAPLASTY Bilateral 03/25/2010  . BREAST ENHANCEMENT SURGERY  2010  . BREAST IMPLANT EXCHANGE  10/2014   exchange saline implants, B mastopexy/capsulorraphy ([facility])  . BUNIONECTOMY Bilateral yrs ago  . [place]   x 1  . CHOLECYSTECTOMY  06/11/2012   Procedure: LAPAROSCOPIC CHOLECYSTECTOMY WITH INTRAOPERATIVE CHOLANGIOGRAM;  Surgeon: [name], MD,FACS;  Location: [facility] ORS;  Service: General;  Laterality: N/A;  . COLONOSCOPY  02/2018   done for  positive cologuard - WNL, rpt 10 yrs [facility] Plan)  . CYSTOSCOPY    . ERCP  05/22/2012   Procedure: ENDOSCOPIC RETROGRADE CHOLANGIOPANCREATOGRAPHY (ERCP);  Surgeon: [name], MD,FACG;  Location: [facility];  Service: Endoscopy;  Laterality: N/A;  . ERCP N/A 09/17/2013   Procedure: ENDOSCOPIC RETROGRADE CHOLANGIOPANCREATOGRAPHY (ERCP);  Surgeon: [name], MD;  Location: [facility];  Service: Endoscopy;  Laterality: N/A;  . ESOPHAGOGASTRODUODENOSCOPY  09/2016   WNL. esophagus dilated [facility] Plan)  . HEMORRHOID BANDING  09-23-13   --Dr. Greer Pickerel  . HERNIA REPAIR     inguinal  . HYSTEROSCOPY W/ ENDOMETRIAL ABLATION    . NASAL SINUS SURGERY     x5  . UPPER GASTROINTESTINAL ENDOSCOPY     Family History  Problem Relation Age of Onset  . CAD Father 49       MI, nonsmoker  . Esophageal cancer Father 42  . Stomach cancer Father   . Scleroderma Mother   . Hypertension Mother   . Esophageal cancer Paternal Grandfather   . Stomach cancer Paternal Grandfather   . Diabetes Maternal Grandmother   . Arthritis Brother   . Stroke Neg Hx   . Colon cancer Neg Hx   . Rectal cancer Neg Hx    Social History   Socioeconomic History  . Marital status: Married    Spouse name: Not on file  . Number of children: Not on file  . Years of education: Not on file  . Highest education level: Not on file  Occupational History  . Occupation: retired    Fish farm manager: UNEMPLOYED  Social Needs  . Financial resource strain: Not on file  . Food insecurity:    Worry: Not on file    Inability: Not on file  . Transportation needs:    Medical: Not on file    Non-medical: Not on file  Tobacco Use  . Smoking status: Never Smoker  . Smokeless tobacco: Never Used  Substance and Sexual Activity  . Alcohol use: No    Alcohol/week: 0.0 standard drinks  . Drug use: No  . Sexual activity: Yes    Partners: Male    Birth control/protection: Post-menopausal    Comment: vasectomy  Lifestyle  . Physical  activity:    Days per week: Not on file    Minutes per session: Not on file  . Stress: Not on file  Relationships  . Social connections:    Talks on phone: Not on file    Gets together: Not on file    Attends religious service: Not on file    Active member of club or organization: Not on file    Attends meetings of clubs or organizations: Not on file    Relationship status: Not on file  Other Topics Concern  . 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 acyclovir (ZOVIRAX) 800 MG tablet Take 0.5 tablets (400 mg total) by mouth daily.  Marland Kitchen ALPRAZolam (XANAX) 1 MG tablet Take 1 tablet (1 mg total) by mouth 3 (three) times daily as needed for anxiety. With 1/2 tab at night time  . amLODipine (NORVASC) 5 MG tablet Take 1 tablet (5 mg total) by mouth daily.  Marland Kitchen amphetamine-dextroamphetamine (ADDERALL XR) 30 MG 24 hr capsule TAKE 1 CAPSULE (30 MG) BY MOUTH EVERY MORNING  . ARIPiprazole (ABILIFY) 5 MG tablet Take 5 mg by mouth daily.   Marland Kitchen aspirin EC 81 MG tablet Take 81 mg by mouth every other day.   . Calcium-Magnesium-Vitamin D 333-54-562 MG-MG-UNIT TB24 Take 1 tablet by mouth 2 (two) times daily.  . cyanocobalamin (,VITAMIN B-12,) 1000 MCG/ML injection INJECT 1ML INTRAMUSCULARLY EVERY 21 DAYS(DISCARD 28 DAYS AFER FIRST USE)  . cyclobenzaprine (FLEXERIL) 5 MG tablet Take 1 tablet (5 mg total) by mouth 3 (three) times daily as needed for muscle spasms (sedation precautions).  . diclofenac sodium (VOLTAREN) 1 % GEL Apply three grams to three large joints up to three times a day  . EMBEDA 80-3.2 MG CPCR TAKE 1 CAPSULE EVERY DAY  .  esomeprazole (NEXIUM) 40 MG capsule TAKE 1 CAPSULE BY MOUTH TWICE DAILY BEFORE A MEAL  . Estradiol (YUVAFEM) 10 MCG TABS vaginal tablet Place 1 tablet (10 mcg total) vaginally 3 (three) times a week.  . Eszopiclone 3 MG TABS Take 1 tablet (3 mg total) by mouth at bedtime. Take immediately before bedtime  . ferrous sulfate 325 (65 FE) MG tablet Take 325 mg by mouth 3 (three) times a week.  . fluticasone (FLONASE) 50 MCG/ACT nasal spray USE 2 SPRAYS IN BOTH NOSTRILS DAILY  . lamoTRIgine (LAMICTAL) 200 MG tablet Take 200 mg by mouth daily.  Marland Kitchen linaclotide (LINZESS) 290 MCG CAPS capsule Take 1 capsule (290 mcg total) by mouth daily.  Marland Kitchen LYRICA 150 MG capsule TAKE 1 CAPSULE BY MOUTH 3  TIMES DAILY  . magic mouthwash w/lidocaine SOLN Take 5 mLs by mouth 3 (three) times daily as needed for mouth pain. Benadryl/viscous lidocaine 2%/maalox  . MAGNESIUM PO Take 1,000 mg by mouth 3 (three) times daily.  . Manganese 50 MG TABS Take 50 mg by mouth daily.   . Multiple Vitamins-Minerals (ICAPS AREDS 2) CAPS Take 1 capsule by mouth daily.  . NONFORMULARY OR COMPOUNDED ITEM Testosterone propionate 2% in white petrolatum, apply small amount once daily for 5 days per week.  . NONFORMULARY OR COMPOUNDED ITEM Indomethacin Gel 10% Apply to Affect Area up to twice daily  . ondansetron (ZOFRAN) 4 MG tablet TAKE 1 TABLET BY MOUTH EVERY 8 HOURS AS NEEDED FOR NAUSEA OR VOMITING  . pentosan polysulfate (ELMIRON) 100 MG capsule Take 100 mg by mouth 3 (three) times daily.  . phenazopyridine (PYRIDIUM) 100 MG tablet Take 1 tablet (100 mg total) by mouth 3 (three) times daily as needed for pain.  . Polyethylene Glycol 3350 (MIRALAX PO) Take 17 g by mouth 4 (four) times daily.   . SF 5000  PLUS 1.1 % CREA dental cream   . traMADol (ULTRAM) 50 MG tablet Take 1 tablet (50 mg total) by mouth 2 (two) times daily as needed.  . ursodiol (ACTIGALL) 300 MG capsule Take 1 capsule (300 mg total) by mouth 2 (two) times daily.   No  facility-administered encounter medications on file as of 06/28/2018.     Activities of Daily Living In your present state of health, do you have any difficulty performing the following activities: 06/28/2018  Hearing? N  Vision? Y  Comment astigmatism  Difficulty concentrating or making decisions? N  Walking or climbing stairs? N  Dressing or bathing? N  Doing errands, shopping? N  Preparing Food and eating ? N  Using the Toilet? N  In the past six months, have you accidently leaked urine? Y  Do you have problems with loss of bowel control? N  Managing your Medications? N  Managing your Finances? N  Housekeeping or managing your Housekeeping? N  Some recent data might be hidden    Patient Care Team: Ria Bush, MD as PCP - General (Family Medicine) [name], MD as Consulting Physician (Gastroenterology) Jari Pigg, MD as Consulting Physician (Dermatology) Yisroel Ramming, Everardo All, MD as Consulting Physician (Obstetrics and Gynecology) Sherlynn Stalls, MD as Consulting Physician (Ophthalmology) Luberta Mutter, MD as Consulting Physician (Ophthalmology) Amalia Hailey Marily Lente, MD as Referring Physician (Urology) Bo Merino, MD as Consulting Physician (Rheumatology) Newman Pies, MD as Consulting Physician (Neurosurgery)    Assessment:   This is a routine wellness examination for [facility].   Hearing Screening   125Hz  250Hz  500Hz  1000Hz  2000Hz  3000Hz  4000Hz  6000Hz  8000Hz   Right ear:   40 40 40  40    Left ear:   40 40 40  40    Vision Screening Comments: Vision exam in 2019 with Dr. Ellie Lunch   Exercise Activities and Dietary recommendations Current Exercise Habits: Home exercise routine, Type of exercise: walking;stretching;yoga;strength training/weights, Time (Minutes): 35, Frequency (Times/Week): 4, Weekly Exercise (Minutes/Week): 140, Intensity: Mild, Exercise limited by: None identified  Goals    . Increase physical activity     Starting  06/28/2018, I will continue to exercise 30-40 minutes 4 days per week.        Fall Risk Fall Risk  06/28/2018 06/21/2017 06/20/2016 06/15/2015 06/10/2014  Falls in the past year? 1 No No No No  Comment fell walking down stairs - - - -  Number falls in past yr: 0 - - - -  Injury with Fall? 1 - - - -  Comment bruising to hip region - - - -   Depression Screen PHQ 2/9 Scores 06/28/2018 06/21/2017 06/20/2016 06/15/2015  PHQ - 2 Score 3 3 3  0  PHQ- 9 Score 12 9 16  -     Cognitive Function MMSE - Mini Mental State Exam 06/28/2018 06/21/2017  Orientation to time 5 5  Orientation to Place 5 5  Registration 3 3  Attention/ Calculation 0 0  Recall 3 3  Language- name 2 objects 0 0  Language- repeat 1 1  Language- follow 3 step command 3 3  Language- read & follow direction 0 0  Write a sentence 0 0  Copy design 0 0  Total score 20 20     PLEASE NOTE: A Mini-Cog screen was completed. Maximum score is 20. A value of 0 denotes this part of [place] MMSE was not completed or the patient failed this part of the Mini-Cog screening.   Mini-Cog Screening Orientation  to Time - Max 5 pts Orientation to Place - Max 5 pts Registration - Max 3 pts Recall - Max 3 pts Language Repeat - Max 1 pts Language Follow 3 Step Command - Max 3 pts     Immunization History  Administered Date(s) Administered  . Influenza Split 05/04/2011, 04/04/2012  . Influenza Whole 05/25/2007, 04/28/2008, 04/30/2009, 03/26/2010  . Influenza,inj,Quad PF,6+ Mos 04/16/2015, 04/15/2016, 05/16/2017, 04/10/2018  . Influenza-Unspecified 03/25/2014  . MMR 11/28/2017  . Pneumococcal Conjugate-13 08/02/2013  . Pneumococcal Polysaccharide-23 07/30/2009  . Td 11/01/2005, 11/10/2016  . Zoster Recombinat (Shingrix) 07/21/2017, 11/03/2017    Screening Tests Health Maintenance  Topic Date Due  . HIV Screening  06/21/2024 (Originally 01/22/1971)  . DTaP/Tdap/Td (1 - Tdap) 11/11/2026 (Originally 11/11/2016)  . MAMMOGRAM   10/28/2019  . PAP SMEAR  01/14/2020  . TETANUS/TDAP  11/11/2026  . COLONOSCOPY  02/23/2028  . INFLUENZA VACCINE  Completed  . Hepatitis C Screening  Completed      Plan:     I have personally reviewed, addressed, and noted the following in the patient's chart:  A. Medical and social history B. Use of alcohol, tobacco or illicit drugs  C. Current medications and supplements D. Functional ability and status E.  Nutritional status F.  Physical activity G. Advance directives H. List of other physicians I.  Hospitalizations, surgeries, and ER visits in previous 12 months J.  [place] to include hearing, vision, cognitive, depression L. Referrals and appointments - none  In addition, I have reviewed and discussed with patient certain preventive protocols, quality metrics, and best practice recommendations. A written personalized care plan for preventive services as well as general preventive health recommendations were provided to patient.  See attached scanned questionnaire for additional information.   Signed,   Lindell Noe, MHA, BS, LPN Health Coach

## 2018-06-28 NOTE — Telephone Encounter (Signed)
Spoke with pt notifying her rx for sublingual tabs was sent to [facility].  Pt verbalizes understanding and expresses her thanks.

## 2018-06-28 NOTE — Telephone Encounter (Signed)
Patient in office today for AWV and labs.  Patient reports nausea has increased secondary to increased frequency of migraines.  Patient has requested a refill of Zofran but has asked for sublingual form versus tablet form. PCP notified.  Pharmacy of choice is [facility] .

## 2018-06-28 NOTE — Patient Instructions (Signed)
Ms. Bark , Thank you for taking time to come for your Medicare Wellness Visit. I appreciate your ongoing commitment to your health goals. Please review the following plan we discussed and let me know if I can assist you in the future.   These are the goals we discussed: Goals    . Increase physical activity     Starting 06/28/2018, I will continue to exercise 30-40 minutes 4 days per week.        This is a list of the screening recommended for you and due dates:  Health Maintenance  Topic Date Due  . HIV Screening  06/21/2024*  . DTaP/Tdap/Td vaccine (1 - Tdap) 11/11/2026*  . Mammogram  10/28/2019  . Pap Smear  01/14/2020  . Tetanus Vaccine  11/11/2026  . Colon Cancer Screening  02/23/2028  . Flu Shot  Completed  .  Hepatitis C: One time screening is recommended by Center for Disease Control  (CDC) for  adults born from [birth year] through 1963.   Completed  *Topic was postponed. The date shown is not the original due date.   Preventive Care for Adults  A healthy lifestyle and preventive care can promote health and wellness. Preventive health guidelines for adults include the following key practices.  . A routine yearly physical is a good way to check with your health care provider about your health and preventive screening. It is a chance to share any concerns and updates on your health and to receive a thorough exam.  . Visit your dentist for a routine exam and preventive care every 6 months. Brush your teeth twice a day and floss once a day. Good oral hygiene prevents tooth decay and gum disease.  . The frequency of eye exams is based on your age, health, family medical history, use  of contact lenses, and other factors. Follow your health care provider's recommendations for frequency of eye exams.  . Eat a healthy diet. Foods like vegetables, fruits, whole grains, low-fat dairy products, and lean protein foods contain the nutrients you need without too many calories. Decrease your  intake of foods high in solid fats, added sugars, and salt. Eat the right amount of calories for you. Get information about a proper diet from your health care provider, if necessary.  . Regular physical exercise is one of the most important things you can do for your health. Most adults should get at least 150 minutes of moderate-intensity exercise (any activity that increases your heart rate and causes you to sweat) each week. In addition, most adults need muscle-strengthening exercises on 2 or more days a week.  Silver Sneakers may be a benefit available to you. To determine eligibility, you may visit the website: www.silversneakers.com or contact program at (763) 716-7472 Mon-Fri between 8AM-8PM.   . Maintain a healthy weight. The body mass index (BMI) is a screening tool to identify possible weight problems. It provides an estimate of body fat based on height and weight. Your health care provider can find your BMI and can help you achieve or maintain a healthy weight.   For adults 20 years and older: ? A BMI below 18.5 is considered underweight. ? A BMI of 18.5 to 24.9 is normal. ? A BMI of 25 to 29.9 is considered overweight. ? A BMI of 30 and above is considered obese.   . Maintain normal blood lipids and cholesterol levels by exercising and minimizing your intake of saturated fat. Eat a balanced diet with plenty of fruit and vegetables.  Blood tests for lipids and cholesterol should begin at age 33 and be repeated every 5 years. If your lipid or cholesterol levels are high, you are over 50, or you are at high risk for heart disease, you may need your cholesterol levels checked more frequently. Ongoing high lipid and cholesterol levels should be treated with medicines if diet and exercise are not working.  . If you smoke, find out from your health care provider how to quit. If you do not use tobacco, please do not start.  . If you choose to drink alcohol, please do not consume more than 2  drinks per day. One drink is considered to be 12 ounces (355 mL) of beer, 5 ounces (148 mL) of wine, or 1.5 ounces (44 mL) of liquor.  . If you are 67-50 years old, ask your health care provider if you should take aspirin to prevent strokes.  . Use sunscreen. Apply sunscreen liberally and repeatedly throughout the day. You should seek shade when your shadow is shorter than you. Protect yourself by wearing long sleeves, pants, a wide-brimmed hat, and sunglasses year round, whenever you are outdoors.  . Once a month, do a whole body skin exam, using a mirror to look at the skin on your back. Tell your health care provider of new moles, moles that have irregular borders, moles that are larger than a pencil eraser, or moles that have changed in shape or color.

## 2018-06-28 NOTE — Progress Notes (Signed)
PCP notes:   Health maintenance:  No gaps identified.   Abnormal screenings:   Fall risk - hx of single fall Fall Risk  06/28/2018 06/21/2017 06/20/2016 06/15/2015 06/10/2014  Falls in the past year? 1 No No No No  Comment fell walking down stairs - - - -  Number falls in past yr: 0 - - - -  Injury with Fall? 1 - - - -  Comment bruising to hip region - - - -    Depression score: 12 Depression screen [facility] 2/9 06/28/2018 06/21/2017 06/20/2016 06/15/2015 06/10/2014  Decreased Interest 1 1 1  0 0  Down, Depressed, Hopeless 2 2 2  0 0  PHQ - 2 Score 3 3 3  0 0  Altered sleeping 3 1 2  - -  Tired, decreased energy 3 2 3  - -  Change in appetite 0 0 2 - -  Feeling bad or failure about yourself  1 1 0 - -  Trouble concentrating 1 1 3  - -  Moving slowly or fidgety/restless 1 1 3  - -  Suicidal thoughts 0 0 0 - -  PHQ-9 Score 12 9 16  - -  Difficult doing work/chores Somewhat difficult Somewhat difficult Somewhat difficult - -  Some recent data might be hidden   Patient concerns:   Patient reports she is having an increased nausea secondary to increased frequency of migraines. Patient requested a refill of Zofran but wanted it in sublingual form. PCP notified.   Nurse concerns:  None  Next PCP appt:   07/04/18 @ 1130

## 2018-06-29 ENCOUNTER — Other Ambulatory Visit: Payer: Self-pay

## 2018-06-29 ENCOUNTER — Other Ambulatory Visit: Payer: Self-pay | Admitting: *Deleted

## 2018-06-29 ENCOUNTER — Encounter: Payer: Self-pay | Admitting: Family Medicine

## 2018-06-29 MED ORDER — MORPHINE-NALTREXONE 80-3.2 MG PO CPCR: 1.0000 | ORAL_CAPSULE | Freq: Every day | ORAL | 0 refills | Status: DC

## 2018-06-29 NOTE — Telephone Encounter (Signed)
Name of Medication: Embeda Name of Pharmacy: [facility] or Written Date and Quantity: 06/02/18, #30/0 Last Office Visit and Type: 04/10/18, pain f/u Next Office Visit and Type: 07/04/18, CPE Last Controlled Substance Agreement Date: 03/14/17 Last UDS: 03/14/17

## 2018-06-29 NOTE — Telephone Encounter (Signed)
Fwd refill request to Dr. Darnell Level.  Notified pt via [place].

## 2018-06-29 NOTE — Telephone Encounter (Signed)
Eprescribed.

## 2018-06-29 NOTE — Telephone Encounter (Signed)
Electronic refill request. Phenazopyridine Last office visit:   04/10/18 Pain management Last Filled:    20 tablet 0 02/22/2017  Please advise.

## 2018-07-03 ENCOUNTER — Telehealth: Payer: Self-pay | Admitting: Rheumatology

## 2018-07-03 MED ORDER — PHENAZOPYRIDINE HCL 100 MG PO TABS: 100.0000 mg | ORAL_TABLET | Freq: Three times a day (TID) | ORAL | 0 refills | Status: DC | PRN

## 2018-07-03 MED ORDER — PREGABALIN 150 MG PO CAPS: 150.0000 mg | ORAL_CAPSULE | Freq: Two times a day (BID) | ORAL | 0 refills | Status: DC

## 2018-07-03 NOTE — Telephone Encounter (Signed)
Last Visit: 03/29/18 Next Visit 09/27/18  Okay to refill Lyrica?

## 2018-07-03 NOTE — Assessment & Plan Note (Signed)
Preventative protocols reviewed and updated unless pt declined. Discussed healthy diet and lifestyle.  

## 2018-07-03 NOTE — Progress Notes (Signed)
BP 130/70 (BP Location: Left Arm, Patient Position: Sitting, Cuff Size: Normal)   Pulse (!) 104   Temp 98.4 F (36.9 C) (Oral)   Ht 5' 4.5" (1.638 m)   Wt 132 lb (59.9 kg)   LMP 07/25/1998   SpO2 97%   BMI 22.31 kg/m    CC: CPE Subjective:    Patient ID: Rachel Duran, female    DOB: [birth year], 62 y.o.   MRN: [id number]  HPI: Rachel Duran is a 62 y.o. female presenting on 07/04/2018 for Annual Exam (Pt 2. Provided form to be completed. )   Saw Katha Cabal last week for medicare wellness visit. Note reviewed.   Requests we fill out disability placard. Tough last 4-5 weeks. Arthralgias, IC flare  Planning to start bladder instillations (elmiron, lidocaine, NaCO2).  Stable vision loss (Dr Baird [facility]). Eventual central vision loss.  Preventative: COLONOSCOPY 02/2018 - done for positive cologuard - WNL, rpt 10 yrs [facility] Plan).  Well woman with Dr Josefa Half OBGYN 12/2017.  Mammogram WNL 10/2017  DEXA - 2014 with mild osteopenia per Dr Quincy Simmonds Flu shot - yearly pneumova 2011, prevnar 2015 Td 2007, Td 10/2016 Shingrix completed 10/2017.  Advanced care planning - HCPOA scanned into chart 06/2015. HCPOA is husband Takayla Baillie. Does not want prolonged life support if terminally ill.  Seat belt use discussed.  Sunscreen use discussed. No changing moles. Sees derm.  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 Interim medical history since our last visit reviewed. Allergies and medications reviewed and updated. Outpatient Medications Prior to Visit  Medication Sig Dispense Refill  . acyclovir (ZOVIRAX) 800 MG tablet Take 0.5 tablets (400 mg total) by mouth daily. 45 tablet 1    . ALPRAZolam (XANAX) 1 MG tablet Take 1 tablet (1 mg total) by mouth 3 (three) times daily as needed for anxiety. With 1/2 tab at night time  0  . amLODipine (NORVASC) 5 MG tablet Take 1 tablet (5 mg total) by mouth daily. 90 tablet 3  . amphetamine-dextroamphetamine (ADDERALL XR) 30 MG 24 hr capsule TAKE 1 CAPSULE (30 MG) BY MOUTH EVERY MORNING 90 capsule 0  . ARIPiprazole (ABILIFY) 5 MG tablet Take 5 mg by mouth daily.     Marland Kitchen aspirin EC 81 MG tablet Take 81 mg by mouth every other day.     . Calcium-Magnesium-Vitamin D [id number] MG-MG-UNIT TB24 Take 1 tablet by mouth 2 (two) times daily.    . cyanocobalamin (,VITAMIN B-12,) 1000 MCG/ML injection INJECT 1ML INTRAMUSCULARLY EVERY 21 DAYS(DISCARD 28 DAYS AFER FIRST USE) 3 mL 1  . cyclobenzaprine (FLEXERIL) 5 MG tablet Take 1 tablet (5 mg total) by mouth 3 (three) times daily as needed for muscle spasms (sedation precautions). 30 tablet 1  . diclofenac sodium (VOLTAREN) 1 % GEL Apply three grams to three large joints up to three times a day 3 Tube 3  . esomeprazole (NEXIUM) 40 MG capsule TAKE 1 CAPSULE BY MOUTH TWICE DAILY BEFORE A MEAL 60 capsule 11  . Estradiol (YUVAFEM) 10 MCG TABS vaginal tablet Place 1 tablet (10 mcg total) vaginally 3 (three) times a week. 12 tablet 9  . Eszopiclone 3 MG TABS Take 1 tablet (3 mg total)  by mouth at bedtime. Take immediately before bedtime 90 tablet 0  . ferrous sulfate 325 (65 FE) MG tablet Take 325 mg by mouth 3 (three) times a week.    . fluticasone (FLONASE) 50 MCG/ACT nasal spray USE 2 SPRAYS IN BOTH NOSTRILS DAILY 16 g 5  . lamoTRIgine (LAMICTAL) 200 MG tablet Take 200 mg by mouth daily.    Marland Kitchen linaclotide (LINZESS) 290 MCG CAPS capsule Take 1 capsule (290 mcg total) by mouth daily. 90 capsule 3  . magic mouthwash w/lidocaine SOLN Take 5 mLs by mouth 3 (three) times daily as needed for mouth pain. Benadryl/viscous lidocaine 2%/maalox 120 mL 0  . MAGNESIUM PO Take 1,000 mg by mouth 3 (three) times daily.     . Manganese 50 MG TABS Take 50 mg by mouth daily.     . Morphine-Naltrexone (EMBEDA) 80-3.2 MG CPCR Take 1 capsule by mouth daily. 30 capsule 0  . Multiple Vitamins-Minerals (ICAPS AREDS 2) CAPS Take 1 capsule by mouth daily.    . NONFORMULARY OR COMPOUNDED ITEM Testosterone propionate 2% in white petrolatum, apply small amount once daily for 5 days per week. 60 each 1  . NONFORMULARY OR COMPOUNDED ITEM Indomethacin Gel 10% Apply to Affect Area up to twice daily 60 each 1  . ondansetron (ZOFRAN ODT) 4 MG disintegrating tablet Take 1 tablet (4 mg total) by mouth every 8 (eight) hours as needed for nausea or vomiting. 20 tablet 1  . ondansetron (ZOFRAN) 4 MG tablet TAKE 1 TABLET BY MOUTH EVERY 8 HOURS AS NEEDED FOR NAUSEA OR VOMITING 30 tablet 0  . pentosan polysulfate (ELMIRON) 100 MG capsule Take 100 mg by mouth 3 (three) times daily.    . phenazopyridine (PYRIDIUM) 100 MG tablet Take 1 tablet (100 mg total) by mouth 3 (three) times daily as needed for pain. 20 tablet 0  . Polyethylene Glycol 3350 (MIRALAX PO) Take 17 g by mouth 4 (four) times daily.     . pregabalin (LYRICA) 150 MG capsule Take 1 capsule (150 mg total) by mouth 2 (two) times daily. 180 capsule 0  . SF 5000 PLUS 1.1 % CREA dental cream     . traMADol (ULTRAM) 50 MG tablet Take 1 tablet (50 mg total) by mouth 2 (two) times daily as needed. 40 tablet 0  . ursodiol (ACTIGALL) 300 MG capsule Take 1 capsule (300 mg total) by mouth 2 (two) times daily. 60 capsule 11   No facility-administered medications prior to visit.      Per HPI unless specifically indicated in ROS section below Review of Systems  Constitutional: Negative for activity change, appetite change, chills, fatigue, fever and unexpected weight change.  HENT: Negative for hearing loss.   Eyes: Negative for visual disturbance.  Respiratory: Negative for cough, chest tightness, shortness of breath and wheezing.   Cardiovascular: Negative for chest pain, palpitations  and leg swelling.  Gastrointestinal: Positive for constipation (chronic) and nausea. Negative for abdominal distention, abdominal pain, blood in stool, diarrhea and vomiting.  Genitourinary: Negative for difficulty urinating and hematuria.  Musculoskeletal: Positive for arthralgias and back pain (chronic). Negative for myalgias and neck pain.  Skin: Negative for rash.  Neurological: Positive for headaches (migraines). Negative for dizziness, seizures and syncope.  Hematological: Negative for adenopathy. Does not bruise/bleed easily.  Psychiatric/Behavioral: Negative for dysphoric mood. The patient is not nervous/anxious.        Some family stress present - sees Dr Toy Care and therapist       Objective:  BP 130/70 (BP Location: Left Arm, Patient Position: Sitting, Cuff Size: Normal)   Pulse (!) 104   Temp 98.4 F (36.9 C) (Oral)   Ht 5' 4.5" (1.638 m)   Wt 132 lb (59.9 kg)   LMP 07/25/1998   SpO2 97%   BMI 22.31 kg/m   Wt Readings from Last 3 Encounters:  07/04/18 132 lb (59.9 kg)  06/28/18 131 lb 4 oz (59.5 kg)  04/10/18 128 lb (58.1 kg)    Physical Exam  Constitutional: She is oriented to person, place, and time. She appears well-developed and well-nourished. No distress.  HENT:  Head: Normocephalic and atraumatic.  Right Ear: Hearing, tympanic membrane, external ear and ear canal normal.  Left Ear: Hearing, tympanic membrane, external ear and ear canal normal.  Nose: Nose normal.  Mouth/Throat: Uvula is midline, oropharynx is clear and moist and mucous membranes are normal. No oropharyngeal exudate, posterior oropharyngeal edema or posterior oropharyngeal erythema.  Eyes: Pupils are equal, round, and reactive to light. Conjunctivae and EOM are normal. No scleral icterus.  Neck: Normal range of motion. Neck supple. Carotid bruit is not present. No thyromegaly present.  Cardiovascular: Normal rate, regular rhythm, normal heart sounds and intact distal pulses.  No murmur  heard. Pulses:      Radial pulses are 2+ on the right side, and 2+ on the left side.  Pulmonary/Chest: Effort normal and breath sounds normal. No respiratory distress. She has no wheezes. She has no rales.  Abdominal: Soft. Bowel sounds are normal. She exhibits no distension and no mass. There is no tenderness. There is no rebound and no guarding.  Musculoskeletal: Normal range of motion. She exhibits no edema.  Lymphadenopathy:    She has no cervical adenopathy.  Neurological: She is alert and oriented to person, place, and time.  CN grossly intact, station and gait intact  Skin: Skin is warm and dry. No rash noted.  Psychiatric: She has a normal mood and affect. Her behavior is normal. Judgment and thought content normal.  Nursing note and vitals reviewed.  Results for orders placed or performed in visit on 06/28/18  CBC with Differential/Platelet  Result Value Ref Range   WBC 7.1 4.0 - 10.5 K/uL   RBC 4.71 3.87 - 5.11 Mil/uL   Hemoglobin 13.6 12.0 - 15.0 g/dL   HCT 40.7 36.0 - 46.0 %   MCV 86.3 78.0 - 100.0 fl   MCHC 33.5 30.0 - 36.0 g/dL   RDW 14.0 11.5 - 15.5 %   Platelets 329.0 150.0 - 400.0 K/uL   Neutrophils Relative % 53.6 43.0 - 77.0 %   Lymphocytes Relative 36.9 12.0 - 46.0 %   Monocytes Relative 7.3 3.0 - 12.0 %   Eosinophils Relative 1.6 0.0 - 5.0 %   Basophils Relative 0.6 0.0 - 3.0 %   Neutro Abs 3.8 1.4 - 7.7 K/uL   Lymphs Abs 2.6 0.7 - 4.0 K/uL   Monocytes Absolute 0.5 0.1 - 1.0 K/uL   Eosinophils Absolute 0.1 0.0 - 0.7 K/uL   Basophils Absolute 0.0 0.0 - 0.1 K/uL  IBC panel  Result Value Ref Range   Iron 77 42 - 145 ug/dL   Transferrin 303.0 212.0 - 360.0 mg/dL   Saturation Ratios 18.2 (L) 20.0 - 50.0 %  Ferritin  Result Value Ref Range   Ferritin 78.4 10.0 - 291.0 ng/mL  Hemoglobin A1c  Result Value Ref Range   Hgb A1c MFr Bld 6.1 4.6 - 6.5 %  T4, free  Result  Value Ref Range   Free T4 0.74 0.60 - 1.60 ng/dL  TSH  Result Value Ref Range   TSH 2.75  0.35 - 4.50 uIU/mL  Comprehensive metabolic panel  Result Value Ref Range   Sodium 137 135 - 145 mEq/L   Potassium 4.5 3.5 - 5.1 mEq/L   Chloride 101 96 - 112 mEq/L   CO2 30 19 - 32 mEq/L   Glucose, Bld 116 (H) 70 - 99 mg/dL   BUN 9 6 - 23 mg/dL   Creatinine, Ser 0.62 0.40 - 1.20 mg/dL   Total Bilirubin 0.5 0.2 - 1.2 mg/dL   Alkaline Phosphatase 90 39 - 117 U/L   AST 23 0 - 37 U/L   ALT 20 0 - 35 U/L   Total Protein 6.9 6.0 - 8.3 g/dL   Albumin 4.6 3.5 - 5.2 g/dL   Calcium 9.4 8.4 - 10.5 mg/dL   GFR 104.21 >60.00 mL/min  Lipid panel  Result Value Ref Range   Cholesterol 215 (H) 0 - 200 mg/dL   Triglycerides 124.0 0.0 - 149.0 mg/dL   HDL 65.30 >39.00 mg/dL   VLDL 24.8 0.0 - 40.0 mg/dL   LDL Cholesterol 124 (H) 0 - 99 mg/dL   Total CHOL/HDL Ratio 3    NonHDL 149.23       Assessment & Plan:   Problem List Items Addressed This Visit    Venous insufficiency of left lower extremity    With inflammation/tenderness. rec 20-7mmHg compression stockings (Rx provided today). She will let me know if desires vascular eval.      Stargardt's disease    Stable recent ophtho eval      Prediabetes    Reviewed with patient, encouraged avoiding added sugars.       MDD (major depressive disorder), recurrent episode, moderate ([place])    Sees psychiatrist and psychologist      Iron deficiency    Stable period. Continue MWF oral iron dosing      Health maintenance examination - Primary    Preventative protocols reviewed and updated unless pt declined. Discussed healthy diet and lifestyle.       Fibromyalgia   Encounter for chronic pain management     CSRS reviewed through PMP      Dyslipidemia    Chronic, stable off meds.  The 10-year ASCVD risk score Mikey Bussing DC Brooke Bonito., et al., 2013) is: 3.2%   Values used to calculate the score:     Age: 38 years     Sex: Female     Is Non-Hispanic African American: No     Diabetic: No     Tobacco smoker: No     Systolic Blood Pressure: 177  mmHg     Is BP treated: No     HDL Cholesterol: 65.3 mg/dL     Total Cholesterol: 215 mg/dL       Drug-induced constipation    Managed with miralax, benefiber, and linzess.       Abnormal thyroid function test    Repeat testing normal.          No orders of the defined types were placed in this encounter.  No orders of the defined types were placed in this encounter.   Follow up plan: No follow-ups on file.  Ria Bush, MD

## 2018-07-03 NOTE — Assessment & Plan Note (Signed)
[place] CSRS reviewed through PMP

## 2018-07-03 NOTE — Telephone Encounter (Signed)
Please call patient to clarify how she is taking this medication.  This is a very high dose of Lyrica.

## 2018-07-03 NOTE — Telephone Encounter (Signed)
Patient called requesting prescription refill of Lyrica (90 day supply) to be sent to [facility] on [address] in [place].  Patient states she has changed pharmacies and does not want the prescription to go to the mail order pharmacy.  Patient states [facility] will fill a 90 day supply.

## 2018-07-04 ENCOUNTER — Ambulatory Visit (INDEPENDENT_AMBULATORY_CARE_PROVIDER_SITE_OTHER): Payer: Medicare Other | Admitting: Family Medicine

## 2018-07-04 ENCOUNTER — Encounter: Payer: Self-pay | Admitting: Family Medicine

## 2018-07-04 VITALS — BP 130/70 | HR 104 | Temp 98.4°F | Ht 64.5 in | Wt 132.0 lb

## 2018-07-04 DIAGNOSIS — E611 Iron deficiency: Secondary | ICD-10-CM

## 2018-07-04 DIAGNOSIS — E785 Hyperlipidemia, unspecified: Secondary | ICD-10-CM

## 2018-07-04 DIAGNOSIS — H3553 Other dystrophies primarily involving the sensory retina: Secondary | ICD-10-CM

## 2018-07-04 DIAGNOSIS — I872 Venous insufficiency (chronic) (peripheral): Secondary | ICD-10-CM | POA: Diagnosis not present

## 2018-07-04 DIAGNOSIS — R7303 Prediabetes: Secondary | ICD-10-CM

## 2018-07-04 DIAGNOSIS — F331 Major depressive disorder, recurrent, moderate: Secondary | ICD-10-CM

## 2018-07-04 DIAGNOSIS — G8929 Other chronic pain: Secondary | ICD-10-CM

## 2018-07-04 DIAGNOSIS — M797 Fibromyalgia: Secondary | ICD-10-CM

## 2018-07-04 DIAGNOSIS — R946 Abnormal results of thyroid function studies: Secondary | ICD-10-CM

## 2018-07-04 DIAGNOSIS — Z0000 Encounter for general adult medical examination without abnormal findings: Secondary | ICD-10-CM

## 2018-07-04 DIAGNOSIS — K5903 Drug induced constipation: Secondary | ICD-10-CM

## 2018-07-04 NOTE — Assessment & Plan Note (Signed)
Sees psychiatrist and psychologist

## 2018-07-04 NOTE — Assessment & Plan Note (Signed)
With inflammation/tenderness. rec 20-45mmHg compression stockings (Rx provided today). She will let me know if desires vascular eval.

## 2018-07-04 NOTE — Assessment & Plan Note (Signed)
Repeat testing normal

## 2018-07-04 NOTE — Assessment & Plan Note (Signed)
Reviewed with patient, encouraged avoiding added sugars.

## 2018-07-04 NOTE — Assessment & Plan Note (Signed)
Stable recent ophtho eval

## 2018-07-04 NOTE — Assessment & Plan Note (Signed)
Managed with miralax, benefiber, and linzess.

## 2018-07-04 NOTE — Assessment & Plan Note (Signed)
Chronic, stable off meds.  The 10-year ASCVD risk score Mikey Bussing DC Brooke Bonito., et al., 2013) is: 3.2%   Values used to calculate the score:     Age: 62 years     Sex: Female     Is Non-Hispanic African American: No     Diabetic: No     Tobacco smoker: No     Systolic Blood Pressure: 721 mmHg     Is BP treated: No     HDL Cholesterol: 65.3 mg/dL     Total Cholesterol: 215 mg/dL

## 2018-07-04 NOTE — Patient Instructions (Signed)
You are doing well today Return as needed or in 3 months for pain visit. Use compression stockings for veins, let us know if you'd like vascular evaluation.    Health Maintenance, Female Adopting a healthy lifestyle and getting preventive care can go a long way to promote health and wellness. Talk with your health care provider about what schedule of regular examinations is right for you. This is a good chance for you to check in with your provider about disease prevention and staying healthy. In between checkups, there are plenty of things you can do on your own. Experts have done a lot of research about which lifestyle changes and preventive measures are most likely to keep you healthy. Ask your health care provider for more information. Weight and diet Eat a healthy diet  Be sure to include plenty of vegetables, fruits, low-fat dairy products, and lean protein.  Do not eat a lot of foods high in solid fats, added sugars, or salt.  Get regular exercise. This is one of the most important things you can do for your health. ? Most adults should exercise for at least 150 minutes each week. The exercise should increase your heart rate and make you sweat (moderate-intensity exercise). ? Most adults should also do strengthening exercises at least twice a week. This is in addition to the moderate-intensity exercise.  Maintain a healthy weight  Body mass index (BMI) is a measurement that can be used to identify possible weight problems. It estimates body fat based on height and weight. Your health care provider can help determine your BMI and help you achieve or maintain a healthy weight.  For females 68 years of age and older: ? A BMI below 18.5 is considered underweight. ? A BMI of 18.5 to 24.9 is normal. ? A BMI of 25 to 29.9 is considered overweight. ? A BMI of 30 and above is considered obese.  Watch levels of cholesterol and blood lipids  You should start having your blood tested for  lipids and cholesterol at 60 years of age, then have this test every 5 years.  You may need to have your cholesterol levels checked more often if: ? Your lipid or cholesterol levels are high. ? You are older than 60 years of age. ? You are at high risk for heart disease.  Cancer screening Lung Cancer  Lung cancer screening is recommended for adults 52-63 years old who are at high risk for lung cancer because of a history of smoking.  A yearly low-dose CT scan of the lungs is recommended for people who: ? Currently smoke. ? Have quit within the past 15 years. ? Have at least a 30-pack-year history of smoking. A pack year is smoking an average of one pack of cigarettes a day for 1 year.  Yearly screening should continue until it has been 15 years since you quit.  Yearly screening should stop if you develop a health problem that would prevent you from having lung cancer treatment.  Breast Cancer  Practice breast self-awareness. This means understanding how your breasts normally appear and feel.  It also means doing regular breast self-exams. Let your health care provider know about any changes, no matter how small.  If you are in your 20s or 30s, you should have a clinical breast exam (CBE) by a health care provider every 1-3 years as part of a regular health exam.  If you are 1 or older, have a CBE every year. Also consider having a  breast X-ray (mammogram) every year.  If you have a family history of breast cancer, talk to your health care provider about genetic screening.  If you are at high risk for breast cancer, talk to your health care provider about having an MRI and a mammogram every year.  Breast cancer gene (BRCA) assessment is recommended for women who have family members with BRCA-related cancers. BRCA-related cancers include: ? Breast. ? Ovarian. ? Tubal. ? Peritoneal cancers.  Results of the assessment will determine the need for genetic counseling and BRCA1 and  BRCA2 testing.  Cervical Cancer Your health care provider may recommend that you be screened regularly for cancer of the pelvic organs (ovaries, uterus, and vagina). This screening involves a pelvic examination, including checking for microscopic changes to the surface of your cervix (Pap test). You may be encouraged to have this screening done every 3 years, beginning at age 55.  For women ages 56-65, health care providers may recommend pelvic exams and Pap testing every 3 years, or they may recommend the Pap and pelvic exam, combined with testing for human papilloma virus (HPV), every 5 years. Some types of HPV increase your risk of cervical cancer. Testing for HPV may also be done on women of any age with unclear Pap test results.  Other health care providers may not recommend any screening for nonpregnant women who are considered low risk for pelvic cancer and who do not have symptoms. Ask your health care provider if a screening pelvic exam is right for you.  If you have had past treatment for cervical cancer or a condition that could lead to cancer, you need Pap tests and screening for cancer for at least 20 years after your treatment. If Pap tests have been discontinued, your risk factors (such as having a new sexual partner) need to be reassessed to determine if screening should resume. Some women have medical problems that increase the chance of getting cervical cancer. In these cases, your health care provider may recommend more frequent screening and Pap tests.  Colorectal Cancer  This type of cancer can be detected and often prevented.  Routine colorectal cancer screening usually begins at 59 years of age and continues through 60 years of age.  Your health care provider may recommend screening at an earlier age if you have risk factors for colon cancer.  Your health care provider may also recommend using home test kits to check for hidden blood in the stool.  A small camera at the  end of a tube can be used to examine your colon directly (sigmoidoscopy or colonoscopy). This is done to check for the earliest forms of colorectal cancer.  Routine screening usually begins at age 5.  Direct examination of the colon should be repeated every 5-10 years through 60 years of age. However, you may need to be screened more often if early forms of precancerous polyps or small growths are found.  Skin Cancer  Check your skin from head to toe regularly.  Tell your health care provider about any new moles or changes in moles, especially if there is a change in a mole's shape or color.  Also tell your health care provider if you have a mole that is larger than the size of a pencil eraser.  Always use sunscreen. Apply sunscreen liberally and repeatedly throughout the day.  Protect yourself by wearing long sleeves, pants, a wide-brimmed hat, and sunglasses whenever you are outside.  Heart disease, diabetes, and high blood pressure  High  blood pressure causes heart disease and increases the risk of stroke. High blood pressure is more likely to develop in: ? People who have blood pressure in the high end of the normal range (130-139/85-89 mm Hg). ? People who are overweight or obese. ? People who are African American.  If you are 27-48 years of age, have your blood pressure checked every 3-5 years. If you are 45 years of age or older, have your blood pressure checked every year. You should have your blood pressure measured twice-once when you are at a hospital or clinic, and once when you are not at a hospital or clinic. Record the average of the two measurements. To check your blood pressure when you are not at a hospital or clinic, you can use: ? An automated blood pressure machine at a pharmacy. ? A home blood pressure monitor.  If you are between 50 years and 51 years old, ask your health care provider if you should take aspirin to prevent strokes.  Have regular diabetes  screenings. This involves taking a blood sample to check your fasting blood sugar level. ? If you are at a normal weight and have a low risk for diabetes, have this test once every three years after 60 years of age. ? If you are overweight and have a high risk for diabetes, consider being tested at a younger age or more often. Preventing infection Hepatitis B  If you have a higher risk for hepatitis B, you should be screened for this virus. You are considered at high risk for hepatitis B if: ? You were born in a country where hepatitis B is common. Ask your health care provider which countries are considered high risk. ? Your parents were born in a high-risk country, and you have not been immunized against hepatitis B (hepatitis B vaccine). ? You have HIV or AIDS. ? You use needles to inject street drugs. ? You live with someone who has hepatitis B. ? You have had sex with someone who has hepatitis B. ? You get hemodialysis treatment. ? You take certain medicines for conditions, including cancer, organ transplantation, and autoimmune conditions.  Hepatitis C  Blood testing is recommended for: ? Everyone born from [birth year] through 1963. ? Anyone with known risk factors for hepatitis C.  Sexually transmitted infections (STIs)  You should be screened for sexually transmitted infections (STIs) including gonorrhea and chlamydia if: ? You are sexually active and are younger than 60 years of age. ? You are older than 60 years of age and your health care provider tells you that you are at risk for this type of infection. ? Your sexual activity has changed since you were last screened and you are at an increased risk for chlamydia or gonorrhea. Ask your health care provider if you are at risk.  If you do not have HIV, but are at risk, it may be recommended that you take a prescription medicine daily to prevent HIV infection. This is called pre-exposure prophylaxis (PrEP). You are considered at risk  if: ? You are sexually active and do not regularly use condoms or know the HIV status of your partner(s). ? You take drugs by injection. ? You are sexually active with a partner who has HIV.  Talk with your health care provider about whether you are at high risk of being infected with HIV. If you choose to begin PrEP, you should first be tested for HIV. You should then be tested every 3 months for  as long as you are taking PrEP. Pregnancy  If you are premenopausal and you may become pregnant, ask your health care provider about preconception counseling.  If you may become pregnant, take 400 to 800 micrograms (mcg) of folic acid every day.  If you want to prevent pregnancy, talk to your health care provider about birth control (contraception). Osteoporosis and menopause  Osteoporosis is a disease in which the bones lose minerals and strength with aging. This can result in serious bone fractures. Your risk for osteoporosis can be identified using a bone density scan.  If you are 69 years of age or older, or if you are at risk for osteoporosis and fractures, ask your health care provider if you should be screened.  Ask your health care provider whether you should take a calcium or vitamin D supplement to lower your risk for osteoporosis.  Menopause may have certain physical symptoms and risks.  Hormone replacement therapy may reduce some of these symptoms and risks. Talk to your health care provider about whether hormone replacement therapy is right for you. Follow these instructions at home:  Schedule regular health, dental, and eye exams.  Stay current with your immunizations.  Do not use any tobacco products including cigarettes, chewing tobacco, or electronic cigarettes.  If you are pregnant, do not drink alcohol.  If you are breastfeeding, limit how much and how often you drink alcohol.  Limit alcohol intake to no more than 1 drink per day for nonpregnant women. One drink  equals 12 ounces of beer, 5 ounces of wine, or 1 ounces of hard liquor.  Do not use street drugs.  Do not share needles.  Ask your health care provider for help if you need support or information about quitting drugs.  Tell your health care provider if you often feel depressed.  Tell your health care provider if you have ever been abused or do not feel safe at home. This information is not intended to replace advice given to you by your health care provider. Make sure you discuss any questions you have with your health care provider. Document Released: 01/24/2011 Document Revised: 12/17/2015 Document Reviewed: 04/14/2015 Elsevier Interactive Patient Education  [employer].

## 2018-07-04 NOTE — Assessment & Plan Note (Signed)
Stable period. Continue MWF oral iron dosing

## 2018-07-09 ENCOUNTER — Other Ambulatory Visit: Payer: Self-pay | Admitting: Physician Assistant

## 2018-07-09 ENCOUNTER — Other Ambulatory Visit: Payer: Self-pay | Admitting: Family Medicine

## 2018-07-09 NOTE — Telephone Encounter (Signed)
Last Visit: 03/29/18 Next Visit 09/27/18  Okay to refill per Dr. Estanislado Pandy

## 2018-07-09 NOTE — Telephone Encounter (Signed)
Electronic refill request [email or web address] Last refill 04/10/18 Last office visit 07/04/18

## 2018-07-10 NOTE — Telephone Encounter (Signed)
Eprescribed.

## 2018-07-16 ENCOUNTER — Other Ambulatory Visit: Payer: Self-pay | Admitting: Family Medicine

## 2018-07-16 NOTE — Telephone Encounter (Signed)
Name of Medication: tramadol 50 mg Name of [facility]acy: [facility] Last Fill or Written Date and Quantity: # 40 on 06/23/17 Last Office Visit and Type: 07/04/18 annual Next Office Visit and Type: 10/03/18  3 mth FU Last Controlled Substance Agreement Date: 03/14/17 Last UDS:03/14/17.

## 2018-07-17 NOTE — Telephone Encounter (Signed)
Eprescribed.

## 2018-07-21 ENCOUNTER — Encounter: Payer: Self-pay | Admitting: Family Medicine

## 2018-07-26 ENCOUNTER — Ambulatory Visit (INDEPENDENT_AMBULATORY_CARE_PROVIDER_SITE_OTHER): Payer: Medicare Other | Admitting: Physician Assistant

## 2018-07-26 ENCOUNTER — Encounter: Payer: Self-pay | Admitting: Physician Assistant

## 2018-07-26 VITALS — BP 137/81 | HR 91 | Resp 12 | Ht 64.0 in | Wt 134.0 lb

## 2018-07-26 DIAGNOSIS — M7062 Trochanteric bursitis, left hip: Secondary | ICD-10-CM

## 2018-07-26 DIAGNOSIS — M797 Fibromyalgia: Secondary | ICD-10-CM

## 2018-07-26 DIAGNOSIS — G9332 Myalgic encephalomyelitis/chronic fatigue syndrome: Secondary | ICD-10-CM

## 2018-07-26 DIAGNOSIS — Z8659 Personal history of other mental and behavioral disorders: Secondary | ICD-10-CM

## 2018-07-26 DIAGNOSIS — M25562 Pain in left knee: Secondary | ICD-10-CM | POA: Diagnosis not present

## 2018-07-26 DIAGNOSIS — M25561 Pain in right knee: Secondary | ICD-10-CM

## 2018-07-26 DIAGNOSIS — I7300 Raynaud's syndrome without gangrene: Secondary | ICD-10-CM

## 2018-07-26 DIAGNOSIS — R5382 Chronic fatigue, unspecified: Secondary | ICD-10-CM

## 2018-07-26 DIAGNOSIS — G8929 Other chronic pain: Secondary | ICD-10-CM

## 2018-07-26 DIAGNOSIS — M5030 Other cervical disc degeneration, unspecified cervical region: Secondary | ICD-10-CM

## 2018-07-26 DIAGNOSIS — G894 Chronic pain syndrome: Secondary | ICD-10-CM

## 2018-07-26 DIAGNOSIS — R7612 Nonspecific reaction to cell mediated immunity measurement of gamma interferon antigen response without active tuberculosis: Secondary | ICD-10-CM

## 2018-07-26 DIAGNOSIS — M5136 Other intervertebral disc degeneration, lumbar region: Secondary | ICD-10-CM

## 2018-07-26 DIAGNOSIS — Z8639 Personal history of other endocrine, nutritional and metabolic disease: Secondary | ICD-10-CM

## 2018-07-26 DIAGNOSIS — F331 Major depressive disorder, recurrent, moderate: Secondary | ICD-10-CM

## 2018-07-26 DIAGNOSIS — Z8719 Personal history of other diseases of the digestive system: Secondary | ICD-10-CM

## 2018-07-26 DIAGNOSIS — M722 Plantar fascial fibromatosis: Secondary | ICD-10-CM

## 2018-07-26 DIAGNOSIS — R7303 Prediabetes: Secondary | ICD-10-CM

## 2018-07-26 DIAGNOSIS — M170 Bilateral primary osteoarthritis of knee: Secondary | ICD-10-CM

## 2018-07-26 MED ORDER — LIDOCAINE HCL 1 % IJ SOLN
1.5000 mL | INTRAMUSCULAR | Status: AC | PRN
  Administered 2018-07-26: 1.5 mL

## 2018-07-26 MED ORDER — TRIAMCINOLONE ACETONIDE 40 MG/ML IJ SUSP
40.0000 mg | INTRAMUSCULAR | Status: AC | PRN
  Administered 2018-07-26: 40 mg via INTRA_ARTICULAR

## 2018-07-26 MED ORDER — AMLODIPINE BESYLATE 5 MG PO TABS: 5.0000 mg | ORAL_TABLET | Freq: Every day | ORAL | 2 refills | Status: DC

## 2018-07-26 MED ORDER — NITROGLYCERIN 2 % TD OINT: TOPICAL_OINTMENT | TRANSDERMAL | 0 refills | Status: DC

## 2018-07-26 NOTE — Progress Notes (Signed)
Pharmacy Note  Subjective: Patient presents to [facility] to see Hazel Sams, PA for [name] symptoms. Patient seen by pharmacist for counseling on topical nitroglycerin.Current treatment includes non-pharmacogical measures and amlodipine 5 mg.  Objective:  Vitals:   07/26/18 1256  BP: 137/81  Pulse: 91  Resp: 12   Assessment/Plan:  Patient given handout on [name] and reviewed non-pharmacologic management including keeping warm, managing stress and wearing gloves.  Counseled patient on purpose, proper use, and adverse effects of topical nitroglycerin ointment including nausea, flushing, headache, and dizziness.  Instructed patient to apply 1/4 inch to fingertips with q-tip up to 3 times a day.  Advised patient to avoid contact with face and eyes. Also advised that prescription may require a PA. Patient verbalized understanding.    Patient is also using Voltaren gel alternating with topical indomethicin for knee and hip pain but also applies to fingers.  Reviewed appropriate administration of topical NSAID's and applying to top of joints and to apply with q-tip to avoid additional absorption via palm. Advised patient to wash hands after applying to larger joints.  Also instructed patient to increase her amlodipine to 7.5 mg daily.  She prefers to cut her current 5 mg tablets in half.  Advised patient to monitor her blood pressure and for symptoms of low blood pressure.  Instructed patient to call our office and/or PCP if significant drop in blood pressure.  Patient verbalized understanding.  All questions encouraged and answered.  Instructed patient to call with any further questions or concerns.  Mariella Saa, PharmD, [facility] Rheumatology Clinical Pharmacist  07/26/2018 2:28 PM

## 2018-07-26 NOTE — Patient Instructions (Signed)

## 2018-07-26 NOTE — Progress Notes (Signed)
Office Visit Note  Patient: Rachel Duran             Date of Birth: 10/22/1955           MRN: [id number]             PCP: Ria Bush, MD Referring: Ria Bush, MD Visit Date: 07/26/2018 Occupation: @GUAROCC @  Subjective:  [name]   History of Present Illness: Rachel Duran is a 63 y.o. female with history of fibromyalgia, [name], osteoarthritis, and DDD.  Patient presents today with bilateral knee joint pain.  She states the pain has become severe and she would like cortisone injections today.  She denies any joint swelling.  She states the pain is severe if she standing for a prolonged period of time.  The pain has been waking her up at night.  She describes the pain as a throbbing pain.  She denies any injuries or falls.  She denies any mechanical symptoms.  She occasionally ices her knee joints.  She alternates using voltaren gel and indomethacin gel topically for pain relief.  She continues to have generalized muscle aches and tenderness due to fibromyalgia.  She has bilateral trochanteric bursitis.  She is being treated for IC by Dr. Amalia Hailey.  She states that over the past 6 weeks she has been having worsening symptoms of [name]. She brought pictures of the color changes she experiences.  She states her hands stay cold.  She states the symptoms are worse in the evening when she is at home.  She has been having to wear thick gloves in the evenings.  She states the symptoms are worse in bilateral 5th digits.  She denies any symptoms in her feet.  She states she has been experiences intermittent numbness, tingling, and severe pain.  She does not experience symptoms that wake her up at night. She continues to take Norvasc 5 mg po daily and aspirin 81 mg every other day.  She does not smoke and is not exposed to secondhand smoke. She denies any digital ulcerations or signs of gangrene.    Activities of Daily Living:  Patient reports morning stiffness for 2  hours.     Patient Reports nocturnal pain.  Difficulty dressing/grooming: Denies Difficulty climbing stairs: Reports Difficulty getting out of chair: Reports Difficulty using hands for taps, buttons, cutlery, and/or writing: Reports  Review of Systems  Constitutional: Positive for fatigue.  HENT: Positive for mouth dryness. Negative for mouth sores and nose dryness.   Eyes: Positive for dryness. Negative for pain and visual disturbance.  Respiratory: Negative for cough, hemoptysis, shortness of breath and difficulty breathing.   Cardiovascular: Negative for chest pain, palpitations, hypertension and swelling in legs/feet.  Gastrointestinal: Positive for constipation. Negative for blood in stool and diarrhea.  Endocrine: Negative for increased urination.  Genitourinary: Positive for painful urination (HX of IC).  Musculoskeletal: Positive for arthralgias, joint pain, myalgias, morning stiffness, muscle tenderness and myalgias. Negative for joint swelling and muscle weakness.  Skin: Positive for color change. Negative for pallor, rash, hair loss, nodules/bumps, skin tightness, ulcers and sensitivity to sunlight.  Allergic/Immunologic: Negative for susceptible to infections.  Neurological: Positive for headaches (Hx of migraines ). Negative for dizziness, numbness and weakness.  Hematological: Negative for swollen glands.  Psychiatric/Behavioral: Positive for depressed mood and sleep disturbance. The patient is nervous/anxious.     PMFS History:  Patient Active Problem List   Diagnosis Date Noted  . Venous insufficiency of left lower extremity 07/04/2018  . Dyslipidemia  09/22/2017  . DDD (degenerative disc disease), cervical 04/03/2017  . DDD (degenerative disc disease), lumbar 04/03/2017  . Onychomycosis 03/21/2017  . Dysphagia 10/18/2016  . Encounter for chronic pain management 10/18/2016  . Biliary stasis 09/26/2016  . Fever blister 08/18/2016  . Urinary urgency 07/05/2016  . HNP  (herniated nucleus pulposus), lumbar 09/23/2015  . Sinus congestion 07/30/2015  . Iron deficiency 07/30/2015  . Health maintenance examination 06/15/2015  . 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 Headache 03/09/2015  . Clavicle enlargement 12/13/2014  . Prediabetes 12/13/2014  . Plantar fasciitis, bilateral 09/11/2014  . Advanced care planning/counseling discussion 06/10/2014  . Medicare annual wellness visit, subsequent 06/10/2014  . Abnormal thyroid function test 06/10/2014  . Chronic pain syndrome 06/10/2014  . CFS (chronic fatigue syndrome) 04/08/2014  . Calculus of bile duct 09/17/2013  . Postmenopausal atrophic vaginitis 10/19/2012  . 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 Cervical disc disorder with radiculopathy of cervical region 05/28/2010  . Anemia of chronic disease 03/17/2009  . CHEST PAIN UNSPECIFIED 02/28/2008  . APHTHOUS ULCERS 01/31/2008  . INSOMNIA, CHRONIC 11/09/2007  . Drug-induced constipation 10/11/2007  . ALLERGIC RHINITIS 04/12/2007  . MDD (major depressive disorder), recurrent episode, moderate ([place]) 03/05/2007  . [name] syndrome 03/05/2007  . GERD 03/05/2007  . ROSACEA 03/05/2007  . NEURALGIA 03/05/2007  . 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 CYSTITIS, CHRONIC INTERSTITIAL 12/06/2006  . Fibromyalgia 12/06/2006    Past Medical History:  Diagnosis Date  . Abdominal pain last 4 months   and nausea also  . Allergy   . Anemia   . Anxiety   . 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 C6- C7 -hx. past fusion(bone graft used)  . Cholecystitis   . Chronic pain   . DDD (degenerative disc disease), lumbar 09/2015   dextroscoliosis with multilevel DDD and facet arthrosis most notable for R foraminal disc protrusion L4/5 producing severe R neural foraminal stenosis abutting R L4 nerve root, moderate spinal canal and mild lat recesss and R neural foraminal stenosis L3/4 (MRI)  . Depression    bipolar depression  .  Disorders of porphyrin metabolism   . Felon of finger of left hand 11/10/2016  . Fibromyalgia   . GERD (gastroesophageal reflux disease)   . Internal hemorrhoids   . Interstitial cystitis 06-06-12   hx.  . Irritable bowel syndrome   . PONV (postoperative nausea and vomiting)    now uses stomach blockers and no ponv  . 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 Raynauds disease    hx.  . Seronegative arthritis    [name]  . 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 CAD Father 84       MI, nonsmoker  . Esophageal cancer Father 18  . Stomach cancer Father   . Scleroderma Mother   . Hypertension Mother   . Esophageal cancer Paternal Grandfather   . Stomach cancer Paternal Grandfather   . Diabetes Maternal Grandmother   . Arthritis Brother   . Stroke Neg Hx   . Colon cancer Neg Hx   . Rectal cancer Neg Hx    Past Surgical History:  Procedure Laterality Date  . ANTERIOR CERVICAL DECOMP/DISCECTOMY FUSION  2004   C5/6, C6/7  . ANTERIOR CERVICAL DECOMP/DISCECTOMY FUSION  02/2016   C3/4, C4/5 with plating [name])  . AUGMENTATION MAMMAPLASTY Bilateral 03/25/2010  . BREAST ENHANCEMENT SURGERY  2010  . BREAST IMPLANT EXCHANGE  10/2014   exchange saline implants, B mastopexy/capsulorraphy ([place])  . BUNIONECTOMY Bilateral yrs ago  . [place]   x 1  . CHOLECYSTECTOMY  06/11/2012   Procedure: LAPAROSCOPIC CHOLECYSTECTOMY WITH INTRAOPERATIVE CHOLANGIOGRAM;  Surgeon: [name], MD,FACS;  Location: [facility] ORS;  Service: General;  Laterality: N/A;  . COLONOSCOPY  02/2018   done for positive cologuard - WNL, rpt 10 yrs [facility])  . CYSTOSCOPY    . ERCP  05/22/2012   Procedure: ENDOSCOPIC RETROGRADE CHOLANGIOPANCREATOGRAPHY  (ERCP);  Surgeon: [name], MD,FACG;  Location: [facility];  Service: Endoscopy;  Laterality: N/A;  . ERCP N/A 09/17/2013   Procedure: ENDOSCOPIC RETROGRADE CHOLANGIOPANCREATOGRAPHY (ERCP);  Surgeon: [name], MD;  Location: [facility];  Service: Endoscopy;  Laterality: N/A;  . ESOPHAGOGASTRODUODENOSCOPY  09/2016   WNL. esophagus dilated [facility])  . HEMORRHOID BANDING  09-23-13   --Dr. Greer Pickerel  . HERNIA REPAIR     inguinal  . HYSTEROSCOPY W/ ENDOMETRIAL ABLATION    . NASAL SINUS SURGERY     x5  . 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  Constitutional:      Appearance: She is well-developed.  HENT:     Head: Normocephalic and atraumatic.  Eyes:     Conjunctiva/sclera: Conjunctivae normal.  Neck:     Musculoskeletal: Normal range of motion.  Cardiovascular:     Rate and Rhythm: Normal rate and regular rhythm.     Heart sounds: Normal heart sounds.  Pulmonary:     Effort: Pulmonary effort is normal.     Breath sounds: Normal breath sounds.  Abdominal:     General: Bowel sounds are normal.     Palpations: Abdomen is soft.  Lymphadenopathy:     Cervical: No cervical adenopathy.  Skin:    General: Skin is warm and dry.     Capillary Refill: Capillary refill takes 2 to 3 seconds.     Comments: Hands are cool to touch. No discoloration on exam. Nailbed capillary  changes present.  No digital ulcerations or signs of gangrene. Capillary refill 2-3 seconds.   No telangectasia's noted.    Neurological:     Mental Status: She is alert and oriented to person, place, and time.  Psychiatric:        Behavior: Behavior normal.      Musculoskeletal Exam: Positive tender points and generalized hyperalgesia on exam. C-spine limited ROM. Thoracic and lumbar spine good ROM.  No midline spinal tenderness.  No SI joint tenderness.  Shoulder joints,  elbow joints, wrist joints, MCPs, PIPs, and DIPs good ROM with no synovitis.  PIP and DIP synovial thickening consistent with osteoarthritis.  Hip joints good ROM.  Knee joints good ROM with discomfort.  No warmth or effusion of knee joints.  Bilateral knee joint crepitus.  No tenderness or swelling of ankle joints.  Tenderness over bilateral trochanteric bursa.   CDAI Exam: CDAI Score: Not documented Patient Global Assessment: Not documented; Provider Global Assessment: Not documented Swollen: Not documented; Tender: Not documented Joint Exam   Not documented   There is currently no information documented on the homunculus. Go to the Rheumatology activity and complete the homunculus joint exam.  Investigation: No additional findings.  Imaging: No results found.  Recent Labs: Lab Results  Component Value Date   WBC 7.1 06/28/2018   HGB 13.6 06/28/2018   PLT 329.0 06/28/2018   NA 137 06/28/2018   K 4.5 06/28/2018   CL 101 06/28/2018   CO2 30 06/28/2018   GLUCOSE 116 (H) 06/28/2018   BUN 9 06/28/2018   CREATININE 0.62 06/28/2018   BILITOT 0.5 06/28/2018   ALKPHOS 90 06/28/2018   AST 23 06/28/2018   ALT 20 06/28/2018   PROT 6.9 06/28/2018   ALBUMIN 4.6 06/28/2018   CALCIUM 9.4 06/28/2018   GFRAA >89 04/11/2013    Speciality Comments: No specialty comments available.  Procedures:  Large Joint Inj: bilateral knee on 07/26/2018 1:27 PM Indications: pain Details: 27 G 1.5 in needle, medial  approach  Arthrogram: No  Medications (Right): 1.5 mL lidocaine 1 %; 40 mg triamcinolone acetonide 40 MG/ML Aspirate (Right): 0 mL Medications (Left): 1.5 mL lidocaine 1 %; 40 mg triamcinolone acetonide 40 MG/ML Aspirate (Left): 0 mL Outcome: tolerated well, no immediate complications Procedure, treatment alternatives, risks and benefits explained, specific risks discussed. Consent was given by the patient. Immediately prior to procedure a time out was called to verify the correct patient, procedure, equipment, support staff and site/side marked as required. Patient was prepped and draped in the usual sterile fashion.     Allergies: Codeine phosphate; Cymbalta [duloxetine hcl]; Celebrex [celecoxib]; Inh [isoniazid]; Lithium; Nucynta [tapentadol]; Silenor [doxepin hcl]; Meperidine hcl; and Sulfamethoxazole   Assessment / Plan:     Visit Diagnoses: Fibromyalgia: She continues to have generalized hyperalgesia and positive tender points on exam.  She has been having increased muscle aches and muscle tenderness.  She continues to have bilateral trochanteric bursitis.  She takes tramadol 50 mg BID PRN for pain relief.  She has been using Voltaren gel and alternating with indomethacin gel for pain relief.  She is also on Lyrica 150 mg 3 times daily.  She continues to have chronic fatigue.  She reports that her fatigue level has been stable recently.  She has been having interrupted sleep at night due to bilateral trochanter bursitis and bilateral knee pain.  She has been having severe knee pain that has been preventing her from walking and exercising recently.  She requested bilateral knee joint cortisone injections.  She tolerated the procedure well.  She is encouraged to try to stay active and exercise on a regular basis.  She does not need any refills of medications at this time.  She will follow-up in the office in 6 months.  Chronic pain syndrome -She takes Tramadol 50 mg BID PRN for pain relief.   She is also on Lyrica 150 mg TID.  She experiences constipation as a side effect of tramadol but she reports taking Linzess has helped.  CFS (  chronic fatigue syndrome): She continues to have chronic fatigue but reports her level of fatigue is stable.  She reports taking Adderall XR 30 mg po daily helps with her fatigue.   DDD (degenerative disc disease), cervical: She has limited range of motion of her C-spine on exam.  She has chronic pain in her neck.  She has no symptoms of radiculopathy at this time.  DDD (degenerative disc disease), lumbar -Chronic pain.  She continues to follow up with Dr. [name] and Dr. Maryjean Ka.   Primary osteoarthritis of both knees: She presents today with severe pain in bilateral knee joints.  No warmth or effusion was noted on exam.  She has bilateral knee crepitus.  She has discomfort with range of motion.  She has not been experiencing any mechanical symptoms recently.  She denied any recent injuries or falls.  She uses Voltaren gel topically alternating with indomethacin gel.  She takes tramadol 50 mg 1 tablet twice daily PRN for pain relief.  She is requesting bilateral cortisone injections.  She tolerated procedure well.  The procedure note is completed above.  She is given a handout of knee exercises that she can perform at home.  She will continue using Voltaren gel alternating with indomethacin gel for pain relief.  [name] disease without gangrene -She has been having worsening symptoms of Rayaud's for the past 6 weeks.  She has been unable to identify any triggers.  Her symptoms have been most severe in the evenings.  She brought pictures with her today that revealed a purplish hue of several digits.  She has been having to wear thick gloves while indoors.  She has been experiencing increased numbness, tingling, and severe pain in both hands.  She has not experienced any symptoms of [name] in her feet.  She is a non-smoker.  She continues take Norvasc 5 mg by  mouth daily and aspirin 81 mg every other day.  She has no digital ulcerations or signs of gangrene on exam.  She does have capillary bed changes.  Capillary refill is 2 to 3 seconds.  She has no telangiectasias on exam.  We discussed avoiding triggers.  We also discussed wearing thick gloves on a regular basis.  She should also keep her core body temperature warm.  She can try using hand warmers or electric gloves.  She will increase her dose of Norvasc to 7.5 mg by mouth daily and continue on aspirin 81 mg by mouth every other day.  She was advised to monitor her blood pressure closely after increasing the dose of Norvasc.  She will also be given a prescription for nitroglycerin ointment which she can apply 3 times daily PRN.  The side effects were reviewed of both medications.  She was given a handout of information regarding [name] as well.  She was advised to notify us if she develops any new or worsening symptoms.  If her symptoms persist or worsen we will have to discuss other treatment options.  All questions were addressed today.  She voiced understanding of the above plan.  She agreed to monitor her blood pressure very closely.  Trochanteric bursitis, left hip: She has tenderness on exam. She was encouraged to perform stretching exercises on a regular basis.   Plantar fasciitis, bilateral: She has no symptoms of plantar fasciitis at this time.  She wears proper fitting shoes.   Other medical conditions are listed as follows:   H/O oral aphthous ulcers  Prediabetes  MDD (major depressive disorder), recurrent episode,  moderate ([facility])  History of gastroesophageal reflux (GERD)  History of bipolar disorder  Hx of porphyria  Positive QuantiFERON-TB Gold test   Orders: Orders Placed This Encounter  Procedures  . Large Joint Inj: bilateral knee   Meds ordered this encounter  Medications  . amLODipine (NORVASC) 5 MG tablet    Sig: Take 1 tablet (5 mg total) by mouth daily. Take 1  and a half tablet (7.5 mg) by mouth daily    Dispense:  45 tablet    Refill:  2  . nitroGLYCERIN (NITROGLYN) 2 % ointment    Sig: Apply 1/4 inch to fingertips three times daily.    Dispense:  30 g    Refill:  0    Face-to-face time spent with patient was 30 minutes. Greater than 50% of time was spent in counseling and coordination of care.  Follow-Up Instructions: Return in about 6 months (around 01/24/2019) for Fibromyalgia, [name] syndrome, Osteoarthritis.   Ofilia Neas, PA-C  Note - This record has been created using Dragon software.  Chart creation errors have been sought, but may not always  have been located. Such creation errors do not reflect on  the standard of medical care.

## 2018-07-27 ENCOUNTER — Encounter: Payer: Self-pay | Admitting: Family Medicine

## 2018-07-30 ENCOUNTER — Other Ambulatory Visit: Payer: Self-pay | Admitting: Family Medicine

## 2018-07-30 NOTE — Telephone Encounter (Signed)
Eprescribed.

## 2018-07-30 NOTE — Telephone Encounter (Signed)
Name of Medication: Embeda Name of Pharmacy: [facility] or Written Date and Quantity: 07/02/18 Last Office Visit and Type: 07/04/18, CPE Next Office Visit and Type: 10/03/18, 3 mo f/u Last Controlled Substance Agreement Date: 03/14/17 Last UDS: 03/14/17

## 2018-07-31 ENCOUNTER — Encounter: Payer: Self-pay | Admitting: Family Medicine

## 2018-08-01 ENCOUNTER — Encounter: Payer: Self-pay | Admitting: Family Medicine

## 2018-08-03 MED ORDER — MORPHINE SULFATE ER 30 MG PO TBCR: 30.0000 mg | EXTENDED_RELEASE_TABLET | Freq: Two times a day (BID) | ORAL | 0 refills | Status: DC

## 2018-08-03 NOTE — Telephone Encounter (Signed)
See other mychart message.

## 2018-08-08 ENCOUNTER — Encounter: Payer: Self-pay | Admitting: Family Medicine

## 2018-08-09 ENCOUNTER — Encounter: Payer: Self-pay | Admitting: Family Medicine

## 2018-08-09 MED ORDER — MS CONTIN 30 MG PO TBCR: 30.0000 mg | EXTENDED_RELEASE_TABLET | Freq: Two times a day (BID) | ORAL | 0 refills | Status: DC

## 2018-08-09 NOTE — Telephone Encounter (Signed)
Pt walked in - discussed above.  Will send in MS Contin brand to [facility] [address].  Will fill out pain contract when we have established routine with new pharmacy (likely next visit)

## 2018-08-14 ENCOUNTER — Encounter: Payer: Self-pay | Admitting: Family Medicine

## 2018-08-18 ENCOUNTER — Encounter: Payer: Self-pay | Admitting: Family Medicine

## 2018-08-18 NOTE — Progress Notes (Signed)
I reviewed health advisor's note, was available for consultation, and agree with documentation and plan.  

## 2018-08-21 ENCOUNTER — Encounter: Payer: Self-pay | Admitting: Family Medicine

## 2018-08-21 MED ORDER — MS CONTIN 15 MG PO TBCR: 15.0000 mg | EXTENDED_RELEASE_TABLET | Freq: Two times a day (BID) | ORAL | 0 refills | Status: DC

## 2018-08-21 NOTE — Telephone Encounter (Signed)
Sent in

## 2018-08-21 NOTE — Telephone Encounter (Signed)
Pt left v/m with same info as in mychart note; pt taking MS contin 30 mg bid but still in severe pain and does not feel like her pain is being managed. Pt is supplementing with Ultram. Pt wants to know if needs to be dosage change or what to do. Pt request cb today.

## 2018-08-21 NOTE — Addendum Note (Signed)
Addended by: Ria Bush on: 08/21/2018 05:57 PM   Modules accepted: Orders

## 2018-08-21 NOTE — Telephone Encounter (Addendum)
Spoke with pt relaying Dr. Synthia Innocent message.  Pt verbalizes understanding but states she needs rx to go to [facility]. Would like a call back when this is done.

## 2018-08-21 NOTE — Telephone Encounter (Signed)
plz call - MS contin comes in 15mg , 30mg , 60mg . No 45mg  dose. Will prescribe 15mg  to take with her 30mg  and reassess control. Sent in 15mg  tablets to last the rest of this month until next refill then will decide how pain is controlled.  Total dose will be 45mg  twice daily (30+15).

## 2018-08-21 NOTE — Telephone Encounter (Signed)
Replied on other mychart message.

## 2018-08-27 ENCOUNTER — Other Ambulatory Visit: Payer: Self-pay | Admitting: Family Medicine

## 2018-08-29 ENCOUNTER — Encounter: Payer: Self-pay | Admitting: Family Medicine

## 2018-08-29 DIAGNOSIS — G894 Chronic pain syndrome: Secondary | ICD-10-CM

## 2018-08-31 ENCOUNTER — Encounter: Payer: Self-pay | Admitting: Family Medicine

## 2018-08-31 ENCOUNTER — Telehealth: Payer: Self-pay | Admitting: *Deleted

## 2018-08-31 MED ORDER — KADIAN 40 MG PO CP24: 40.0000 mg | ORAL_CAPSULE | Freq: Two times a day (BID) | ORAL | 0 refills | Status: DC

## 2018-08-31 NOTE — Telephone Encounter (Signed)
Received fax from [place] requesting PA for [facility].  PA completed on CoverMyMeds.  Sent for review.  Can take up to 72 hours for a response.

## 2018-08-31 NOTE — Telephone Encounter (Signed)
Replied via other mychart message.  

## 2018-09-03 NOTE — Telephone Encounter (Signed)
Received faxed PA approval valid through 07/25/2019.

## 2018-09-12 ENCOUNTER — Other Ambulatory Visit: Payer: Self-pay | Admitting: Family Medicine

## 2018-09-12 NOTE — Telephone Encounter (Signed)
Name of Medication: Adderall XR Name of Pharmacy: [facility] or Written Date and Quantity: 06/13/18, #90 Last Office Visit and Type: 07/04/18, CPE Next Office Visit and Type: 10/03/18, 3 mo f/u Last Controlled Substance Agreement Date:  03/14/17   Last UDS:  03/14/17

## 2018-09-13 NOTE — Progress Notes (Deleted)
Office Visit Note  Patient: Rachel Duran             Date of Birth: [birth year]           MRN: [id number]             PCP: Ria Bush, MD Referring: Ria Bush, MD Visit Date: 09/27/2018 Occupation: @GUAROCC @  Subjective:  No chief complaint on file.   History of Present Illness: Rachel Duran is a 63 y.o. female ***   Activities of Daily Living:  Patient reports morning stiffness for *** {minute/hour:19697}.   Patient {ACTIONS;DENIES/REPORTS:21021675::"Denies"} nocturnal pain.  Difficulty dressing/grooming: {ACTIONS;DENIES/REPORTS:21021675::"Denies"} Difficulty climbing stairs: {ACTIONS;DENIES/REPORTS:21021675::"Denies"} Difficulty getting out of chair: {ACTIONS;DENIES/REPORTS:21021675::"Denies"} Difficulty using hands for taps, buttons, cutlery, and/or writing: {ACTIONS;DENIES/REPORTS:21021675::"Denies"}  No Rheumatology ROS completed.   PMFS History:  Patient Active Problem List   Diagnosis Date Noted  . Venous insufficiency of left lower extremity 07/04/2018  . Dyslipidemia 09/22/2017  . DDD (degenerative disc disease), cervical 04/03/2017  . DDD (degenerative disc disease), lumbar 04/03/2017  . Onychomycosis 03/21/2017  . Dysphagia 10/18/2016  . Encounter for chronic pain management 10/18/2016  . Biliary stasis 09/26/2016  . Fever blister 08/18/2016  . Urinary urgency 07/05/2016  . HNP (herniated nucleus pulposus), lumbar 09/23/2015  . Sinus congestion 07/30/2015  . Iron deficiency 07/30/2015  . Health maintenance examination 06/15/2015  . 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 Headache 03/09/2015  . Clavicle enlargement 12/13/2014  . Prediabetes 12/13/2014  . Plantar fasciitis, bilateral 09/11/2014  . Advanced care planning/counseling discussion 06/10/2014  . Medicare annual wellness visit, subsequent 06/10/2014  . Abnormal thyroid function test 06/10/2014  . Chronic pain syndrome 06/10/2014  . CFS (chronic fatigue syndrome) 04/08/2014  .  Calculus of bile duct 09/17/2013  . Postmenopausal atrophic vaginitis 10/19/2012  . 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 Cervical disc disorder with radiculopathy of cervical region 05/28/2010  . Anemia of chronic disease 03/17/2009  . CHEST PAIN UNSPECIFIED 02/28/2008  . APHTHOUS ULCERS 01/31/2008  . INSOMNIA, CHRONIC 11/09/2007  . Drug-induced constipation 10/11/2007  . ALLERGIC RHINITIS 04/12/2007  . MDD (major depressive disorder), recurrent episode, moderate ([place]) 03/05/2007  . [name] syndrome 03/05/2007  . GERD 03/05/2007  . ROSACEA 03/05/2007  . NEURALGIA 03/05/2007  . 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 CYSTITIS, CHRONIC INTERSTITIAL 12/06/2006  . Fibromyalgia 12/06/2006    Past Medical History:  Diagnosis Date  . Abdominal pain last 4 months   and nausea also  . Allergy   . Anemia   . Anxiety   . 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 C6- C7 -hx. past fusion(bone graft used)  . Cholecystitis   . Chronic pain   . DDD (degenerative disc disease), lumbar 09/2015   dextroscoliosis with multilevel DDD and facet arthrosis most notable for R foraminal disc protrusion L4/5 producing severe R neural foraminal stenosis abutting R L4 nerve root, moderate spinal canal and mild lat recesss and R neural foraminal stenosis L3/4 (MRI)  . Depression    bipolar depression  . Disorders of porphyrin metabolism   . Felon of finger of left hand 11/10/2016  . Fibromyalgia   . GERD (gastroesophageal reflux disease)   . Internal hemorrhoids   . Interstitial cystitis 06-06-12   hx.  . Irritable bowel syndrome   . PONV (postoperative nausea and vomiting)    now uses stomach blockers and no ponv  . 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 Raynauds disease    hx.  . Seronegative arthritis    [name]  .  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 CAD Father 1       MI, nonsmoker  . Esophageal cancer Father 6  . Stomach cancer Father   . Scleroderma Mother   . Hypertension Mother   . Esophageal cancer Paternal Grandfather   . Stomach cancer Paternal Grandfather   . Diabetes Maternal Grandmother   . Arthritis Brother   . Stroke Neg Hx   . Colon cancer Neg Hx   . Rectal cancer Neg Hx    Past Surgical History:  Procedure Laterality Date  . ANTERIOR CERVICAL DECOMP/DISCECTOMY FUSION  2004   C5/6, C6/7  . ANTERIOR CERVICAL DECOMP/DISCECTOMY FUSION  02/2016   C3/4, C4/5 with plating [name])  . AUGMENTATION MAMMAPLASTY Bilateral 03/25/2010  . BREAST ENHANCEMENT SURGERY  2010  . BREAST IMPLANT EXCHANGE  10/2014   exchange saline implants, B mastopexy/capsulorraphy ([facility])  . BUNIONECTOMY Bilateral yrs ago  . [place]   x 1  . CHOLECYSTECTOMY  06/11/2012   Procedure: LAPAROSCOPIC CHOLECYSTECTOMY WITH INTRAOPERATIVE CHOLANGIOGRAM;  Surgeon: [name], MD,FACS;  Location: [facility];  Service: General;  Laterality: N/A;  . COLONOSCOPY  02/2018   done for positive cologuard - WNL, rpt 10 yrs Fuller Plan)  . CYSTOSCOPY    . ERCP  05/22/2012   Procedure: ENDOSCOPIC RETROGRADE CHOLANGIOPANCREATOGRAPHY (ERCP);  Surgeon: [name], MD,FACG;  Location: [facility];  Service: Endoscopy;  Laterality: N/A;  . ERCP N/A 09/17/2013   Procedure: ENDOSCOPIC RETROGRADE CHOLANGIOPANCREATOGRAPHY (ERCP);  Surgeon: [name], MD;  Location: [facility];  Service: Endoscopy;  Laterality: N/A;  . ESOPHAGOGASTRODUODENOSCOPY  09/2016   WNL. esophagus dilated Fuller Plan)  . HEMORRHOID BANDING  09-23-13   --Dr. Greer Pickerel  . HERNIA REPAIR     inguinal  . HYSTEROSCOPY W/ ENDOMETRIAL ABLATION    . NASAL SINUS SURGERY     x5  . 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 Influenza Split 05/04/2011, 04/04/2012  . Influenza Whole 05/25/2007, 04/28/2008, 04/30/2009, 03/26/2010  . Influenza,inj,Quad PF,6+ Mos 04/16/2015, 04/15/2016, 05/16/2017, 04/10/2018  . Influenza-Unspecified 03/25/2014  . MMR 11/28/2017  . Pneumococcal Conjugate-13 08/02/2013  . Pneumococcal Polysaccharide-23 07/30/2009  . Td 11/01/2005, 11/10/2016  . Zoster Recombinat (Shingrix) 07/21/2017, 11/03/2017     Objective: Vital Signs: LMP 07/25/1998    Physical Exam   Musculoskeletal Exam: ***  CDAI Exam: CDAI Score: Not documented Patient Global Assessment: Not documented; Provider Global Assessment: Not documented Swollen: Not documented; Tender: Not documented Joint Exam   Not documented   There is currently no information documented on the homunculus. Go to the Rheumatology activity and complete the homunculus joint exam.  Investigation: No additional findings.  Imaging: No results found.  Recent Labs: Lab Results  Component Value Date   WBC 7.1 06/28/2018   HGB 13.6 06/28/2018   PLT 329.0 06/28/2018   NA 137 06/28/2018   K 4.5 06/28/2018   CL 101 06/28/2018   CO2 30 06/28/2018   GLUCOSE 116 (H) 06/28/2018   BUN 9 06/28/2018   CREATININE 0.62 06/28/2018   BILITOT 0.5 06/28/2018   ALKPHOS 90 06/28/2018   AST 23 06/28/2018   ALT 20 06/28/2018   PROT 6.9 06/28/2018   ALBUMIN 4.6 06/28/2018   CALCIUM 9.4 06/28/2018   GFRAA >89 04/11/2013    Speciality Comments: No specialty comments available.  Procedures:  No procedures performed Allergies: Codeine phosphate; Cymbalta  [duloxetine hcl]; Celebrex [celecoxib]; Inh [isoniazid]; Lithium; Nucynta [tapentadol]; Silenor [doxepin hcl]; Meperidine hcl; and Sulfamethoxazole   Assessment / Plan:     Visit Diagnoses: No diagnosis found.   Orders: No orders of the defined types were placed in this encounter.  No orders of the defined types were placed in this encounter.   Face-to-face time spent with patient was *** minutes. Greater than 50% of time was spent in counseling and coordination of care.  Follow-Up Instructions: No follow-ups on file.   Ofilia Neas, PA-C  Note - This record has been created using Dragon software.  Chart creation errors have been sought, but may not always  have been located. Such creation errors do not reflect on  the standard of medical care.

## 2018-09-14 NOTE — Telephone Encounter (Signed)
Eprescribed.

## 2018-09-14 NOTE — Telephone Encounter (Signed)
Patient said she needs the rx called in to pharmacy today.  It takes the pharmacy a couple days to fill it because it's a 90 day supply.  Patient said she'll run out of her medication on Tuesday.

## 2018-09-18 ENCOUNTER — Telehealth: Payer: Self-pay | Admitting: Rheumatology

## 2018-09-18 NOTE — Telephone Encounter (Signed)
Patient called requesting prescription refill of Amlodipine to be sent to [facility] in [place].  Patient states at her last appointment they increased her dosage from 1 tablet to 1 1/2 tablets.  Patient is requesting a return call to let her know if they will increase the amount of pills in her refill.  Patient states she will run out of medication on Sunday 09/23/18.

## 2018-09-18 NOTE — Telephone Encounter (Signed)
Patient advised that a prescription was sent to the pharmacy for the new dose at her appointment on 07/26/18. Patient will contact pharmacy. Patient advise to call the office if she has any trouble getting prescription.

## 2018-09-20 ENCOUNTER — Telehealth: Payer: Self-pay | Admitting: Gastroenterology

## 2018-09-20 NOTE — Telephone Encounter (Signed)
Patient reports GERD and occasional vomiting.  She has a hx of CBD stones and is concerned she may have another one.  She is offered an appt for today, but she can't come.  She wants to coordinate an appt around other appts next week.  She will come in on 09/27/18 11:15 with Dr. Fuller Plan

## 2018-09-20 NOTE — Telephone Encounter (Signed)
Pt has hx of bile duct stones.  Pt reported that she has severe GERD and vomiting.  Please advise.

## 2018-09-21 NOTE — Telephone Encounter (Signed)
Spoke with pt and she states she had another attack last night and had terrible pain. Discussed with her that there are no available appts today and that if she has bad pain again she can always go to the ER. Pt wanted appt sooner. Pts appt moved to Tuesday at 3:45pm. Pt aware of appt.

## 2018-09-21 NOTE — Telephone Encounter (Signed)
Per previous massage would like to know if she can get worked in today with a [place]. Pt was vomiting last night and was not feeling well.

## 2018-09-24 ENCOUNTER — Other Ambulatory Visit: Payer: Self-pay | Admitting: Family Medicine

## 2018-09-24 NOTE — Telephone Encounter (Signed)
Name of Medication: [place] Name of Pharmacy: [facility] or Written Date and Quantity: 08/31/18, #60 Last Office Visit and Type: 07/04/18, CPE Pt 2 Next Office Visit and Type: 10/03/18, 3 mo f/u Last Controlled Substance Agreement Date: 03/14/17 Last UDS: 03/14/17

## 2018-09-25 ENCOUNTER — Other Ambulatory Visit (INDEPENDENT_AMBULATORY_CARE_PROVIDER_SITE_OTHER): Payer: Medicare Other

## 2018-09-25 ENCOUNTER — Encounter: Payer: Self-pay | Admitting: Gastroenterology

## 2018-09-25 ENCOUNTER — Telehealth: Payer: Self-pay | Admitting: Gastroenterology

## 2018-09-25 ENCOUNTER — Ambulatory Visit: Payer: Medicare Other | Admitting: Gastroenterology

## 2018-09-25 ENCOUNTER — Ambulatory Visit (INDEPENDENT_AMBULATORY_CARE_PROVIDER_SITE_OTHER): Payer: Medicare Other | Admitting: Gastroenterology

## 2018-09-25 VITALS — BP 112/74 | HR 68 | Ht 64.0 in | Wt 127.4 lb

## 2018-09-25 DIAGNOSIS — R079 Chest pain, unspecified: Secondary | ICD-10-CM

## 2018-09-25 DIAGNOSIS — K5903 Drug induced constipation: Secondary | ICD-10-CM | POA: Diagnosis not present

## 2018-09-25 DIAGNOSIS — K219 Gastro-esophageal reflux disease without esophagitis: Secondary | ICD-10-CM | POA: Diagnosis not present

## 2018-09-25 LAB — CBC WITH DIFFERENTIAL/PLATELET
Basophils Absolute: 0.1 10*3/uL (ref 0.0–0.1)
Basophils Relative: 1.2 % (ref 0.0–3.0)
Eosinophils Absolute: 0.1 10*3/uL (ref 0.0–0.7)
Eosinophils Relative: 0.8 % (ref 0.0–5.0)
HCT: 37.7 % (ref 36.0–46.0)
Hemoglobin: 12.7 g/dL (ref 12.0–15.0)
LYMPHS ABS: 2.7 10*3/uL (ref 0.7–4.0)
Lymphocytes Relative: 33.1 % (ref 12.0–46.0)
MCHC: 33.7 g/dL (ref 30.0–36.0)
MCV: 85.5 fl (ref 78.0–100.0)
Monocytes Absolute: 0.5 10*3/uL (ref 0.1–1.0)
Monocytes Relative: 6.8 % (ref 3.0–12.0)
Neutro Abs: 4.7 10*3/uL (ref 1.4–7.7)
Neutrophils Relative %: 58.1 % (ref 43.0–77.0)
Platelets: 480 10*3/uL — ABNORMAL HIGH (ref 150.0–400.0)
RBC: 4.41 Mil/uL (ref 3.87–5.11)
RDW: 13.5 % (ref 11.5–15.5)
WBC: 8 10*3/uL (ref 4.0–10.5)

## 2018-09-25 LAB — COMPREHENSIVE METABOLIC PANEL
ALT: 22 U/L (ref 0–35)
AST: 21 U/L (ref 0–37)
Albumin: 4.3 g/dL (ref 3.5–5.2)
Alkaline Phosphatase: 184 U/L — ABNORMAL HIGH (ref 39–117)
BUN: 8 mg/dL (ref 6–23)
CO2: 29 mEq/L (ref 19–32)
Calcium: 9 mg/dL (ref 8.4–10.5)
Chloride: 100 mEq/L (ref 96–112)
Creatinine, Ser: 0.57 mg/dL (ref 0.40–1.20)
GFR: 107.95 mL/min (ref >60.00)
GLUCOSE: 93 mg/dL (ref 70–99)
Potassium: 4.6 mEq/L (ref 3.5–5.1)
Sodium: 136 mEq/L (ref 135–145)
Total Bilirubin: 0.4 mg/dL (ref 0.2–1.2)
Total Protein: 6.5 g/dL (ref 6.0–8.3)

## 2018-09-25 LAB — LIPASE: Lipase: 30 U/L (ref 11.0–59.0)

## 2018-09-25 MED ORDER — NALOXEGOL OXALATE 25 MG PO TABS: 25.0000 mg | ORAL_TABLET | Freq: Every day | ORAL | 11 refills | Status: DC

## 2018-09-25 NOTE — Progress Notes (Signed)
    History of Present Illness: This is a 63 year old female who relates worsening problems with lower sternal area chest pain radiating to her back for the past 2 to 3 weeks.  She feels the symptoms are like her prior biliary symptoms.  She states the symptoms are there most of the time but they worsen intermittently. They appear to be worsened with food. She recently had a change in pain medication recently and with that change she has had a worsening in her constipation. Linzess, Miralax, MOM are no longer effective.  She states her stools are smaller and darker. No melena. She has been treated with iron.  Her colonoscopy in 02/2018 was normal.   Current Medications, Allergies, Past Medical History, Past Surgical History, Family History and Social History were reviewed in [employer] record.  Physical Exam: General: Well developed, well nourished, no acute distress Head: Normocephalic and atraumatic Eyes:  sclerae anicteric, EOMI Ears: Normal auditory acuity Mouth: No deformity or lesions Lungs: Clear throughout to auscultation Heart: Regular rate and rhythm; no murmurs, rubs or bruits Abdomen: Soft, non tender and non distended. No masses, hepatosplenomegaly or hernias noted. Normal Bowel sounds Rectal: Not done Musculoskeletal: Symmetrical with no gross deformities  Pulses:  Normal pulses noted Extremities: No clubbing, cyanosis, edema or deformities noted Neurological: Alert oriented x 4, grossly nonfocal Psychological:  Alert and cooperative. Normal mood and affect   Assessment and Recommendations:  1. Constipation, opioid induced. DC Linzess. Movantik 25 mg daily.  Add MiraLAX or MOM once or twice daily if constipation not adequately managed. REV in 4-6 weeks.   2. GERD. Nexium 40 mg po bid. Follow standard antireflux measures.  3.  Lower chest pain radiating to the back.  Symptoms consistent with prior biliary episodes.  History of biliary stasis maintained  on ursodiol. Her most recent ERCP was in 2015 with a sphincterotomy extension performed. Continue ursodiol 300 mg twice daily.  Light diet. CMP, CBC, lipase today.  Consider RUQ ultrasound, MRCP or ERCP based on blood work results.  4.  History of iron deficiency anemia.  Maintained on iron 3 times per week.  If constipation does not improve with medication changes she is advised to hold iron for a few weeks.

## 2018-09-25 NOTE — Telephone Encounter (Signed)
E Prescribed 

## 2018-09-25 NOTE — Telephone Encounter (Signed)
PT returned your call advised that she can come in at 2:30 today. I went ahead and rescheduled her to come in.

## 2018-09-25 NOTE — H&P (View-Only) (Signed)
    History of Present Illness: This is a 63 year old female who relates worsening problems with lower sternal area chest pain radiating to her back for the past 2 to 3 weeks.  She feels the symptoms are like her prior biliary symptoms.  She states the symptoms are there most of the time but they worsen intermittently. They appear to be worsened with food. She recently had a change in pain medication recently and with that change she has had a worsening in her constipation. Linzess, Miralax, MOM are no longer effective.  She states her stools are smaller and darker. No melena. She has been treated with iron.  Her colonoscopy in 02/2018 was normal.   Current Medications, Allergies, Past Medical History, Past Surgical History, Family History and Social History were reviewed in [employer] record.  Physical Exam: General: Well developed, well nourished, no acute distress Head: Normocephalic and atraumatic Eyes:  sclerae anicteric, EOMI Ears: Normal auditory acuity Mouth: No deformity or lesions Lungs: Clear throughout to auscultation Heart: Regular rate and rhythm; no murmurs, rubs or bruits Abdomen: Soft, non tender and non distended. No masses, hepatosplenomegaly or hernias noted. Normal Bowel sounds Rectal: Not done Musculoskeletal: Symmetrical with no gross deformities  Pulses:  Normal pulses noted Extremities: No clubbing, cyanosis, edema or deformities noted Neurological: Alert oriented x 4, grossly nonfocal Psychological:  Alert and cooperative. Normal mood and affect   Assessment and Recommendations:  1. Constipation, opioid induced. DC Linzess. Movantik 25 mg daily.  Add MiraLAX or MOM once or twice daily if constipation not adequately managed. REV in 4-6 weeks.   2. GERD. Nexium 40 mg po bid. Follow standard antireflux measures.  3.  Lower chest pain radiating to the back.  Symptoms consistent with prior biliary episodes.  History of biliary stasis maintained  on ursodiol. Her most recent ERCP was in 2015 with a sphincterotomy extension performed. Continue ursodiol 300 mg twice daily.  Light diet. CMP, CBC, lipase today.  Consider RUQ ultrasound, MRCP or ERCP based on blood work results.  4.  History of iron deficiency anemia.  Maintained on iron 3 times per week.  If constipation does not improve with medication changes she is advised to hold iron for a few weeks.

## 2018-09-25 NOTE — Patient Instructions (Addendum)
Your provider has requested that you go to the basement level for lab work before leaving today. Press "B" on the elevator. The lab is located at the first door on the left as you exit the elevator.  Stop taking Linzess.  We have sent the following medications to your pharmacy for you to pick up at your convenience: Movantik to take one tablet by mouth daily prior to her first meal, on a empty stomach.   Thank you for choosing me and [facility].  Pricilla Riffle. Dagoberto Ligas., MD., [facility]val Regal

## 2018-09-26 ENCOUNTER — Telehealth: Payer: Self-pay | Admitting: Gastroenterology

## 2018-09-26 ENCOUNTER — Other Ambulatory Visit: Payer: Self-pay

## 2018-09-26 DIAGNOSIS — R112 Nausea with vomiting, unspecified: Secondary | ICD-10-CM

## 2018-09-26 DIAGNOSIS — R748 Abnormal levels of other serum enzymes: Secondary | ICD-10-CM

## 2018-09-26 DIAGNOSIS — K831 Obstruction of bile duct: Secondary | ICD-10-CM

## 2018-09-26 MED ORDER — PLECANATIDE 3 MG PO TABS: 1.0000 | ORAL_TABLET | Freq: Every day | ORAL | 11 refills | Status: DC

## 2018-09-26 NOTE — Telephone Encounter (Signed)
Informed patient of Dr. Lynne Leader recommendations and a prescription for Trulance has been sent to the pharmacy.

## 2018-09-26 NOTE — Telephone Encounter (Signed)
Please advise Dr. Fuller Plan. Movantik was denied with insurance prior authorization.

## 2018-09-26 NOTE — Telephone Encounter (Signed)
Complete samples of Movantik then Trulance 3 mg po daily

## 2018-09-26 NOTE — Progress Notes (Signed)
Office Visit Note  Patient: Rachel Duran             Date of Birth: 16-Nov-1955           MRN: [id number]             PCP: Ria Bush, MD Referring: Ria Bush, MD Visit Date: 10/01/2018 Occupation: @GUAROCC @  Subjective:  Bilateral trochanteric bursitis   History of Present Illness: Rachel Duran is a 63 y.o. female with history of fibromyalgia, osteoarthritis, and DDD.  She is taking Lyrica 150 mg twice daily.  She is been having increased myalgias and muscle aches due to fibromyalgia.  She states that over the past 6 weeks she has had the flu and recurrent infections.  She states last Thursday she has had a bile duct stone removed.  She reports that she is been having increased fatigue and weakness due to the recurrent infections.  She is presents today with bilateral trochanter bursitis.  She states that the left has been very severe.  She has been having up to sleep at night due to the discomfort she has been experiencing.  She would like cortisone injections today.  She states that both knees are doing better after the cortisone injections at her last visit.  She states that she has stiffness in both hands and has some difficulty with buttons as well as opening jars.  She denies any joint swelling at this time.  She has been having significant lower back pain due to scoliosis and herniated disc.  She sees a neurologist and is not a surgical candidate at this time.  She has been to physical therapy in the past and continues to do stretching and strengthening exercises on a regular basis.  She reports that overall her [name] has been better since increasing the dose of Norvasc.  She is also been using nitroglycerin ointment which has been helping but causes lightheadedness and nausea at times.  She denies any digital ulcerations or signs of gangrene.  She is been having increased discoloration over the last couple weeks.   Activities of Daily Living:  Patient reports  morning stiffness for several hours.   Patient Reports nocturnal pain.  Difficulty dressing/grooming: Reports Difficulty climbing stairs: Reports Difficulty getting out of chair: Denies Difficulty using hands for taps, buttons, cutlery, and/or writing: Reports  Review of Systems  Constitutional: Positive for fatigue.  HENT: Positive for mouth dryness. Negative for mouth sores and nose dryness.   Eyes: Negative for pain, itching, visual disturbance and dryness.  Respiratory: Negative for cough, hemoptysis, shortness of breath, wheezing and difficulty breathing.   Cardiovascular: Negative for chest pain, palpitations, hypertension, irregular heartbeat and swelling in legs/feet.  Gastrointestinal: Positive for constipation. Negative for blood in stool and diarrhea.  Endocrine: Negative for increased urination.  Genitourinary: Negative for painful urination and pelvic pain.  Musculoskeletal: Positive for arthralgias, joint pain and morning stiffness. Negative for joint swelling, myalgias, muscle weakness, muscle tenderness and myalgias.  Skin: Negative for color change, pallor, rash, hair loss, nodules/bumps, redness, skin tightness, ulcers and sensitivity to sunlight.  Allergic/Immunologic: Negative for susceptible to infections.  Neurological: Negative for dizziness, light-headedness, numbness, headaches and memory loss.  Hematological: Negative for swollen glands.  Psychiatric/Behavioral: Positive for sleep disturbance. Negative for depressed mood and confusion. The patient is not nervous/anxious.     PMFS History:  Patient Active Problem List   Diagnosis Date Noted  . Common bile duct (CBD) obstruction   . Elevated alkaline phosphatase  level   . Venous insufficiency of left lower extremity 07/04/2018  . Dyslipidemia 09/22/2017  . DDD (degenerative disc disease), cervical 04/03/2017  . DDD (degenerative disc disease), lumbar 04/03/2017  . Onychomycosis 03/21/2017  . Dysphagia  10/18/2016  . Encounter for chronic pain management 10/18/2016  . Biliary stasis 09/26/2016  . Fever blister 08/18/2016  . Urinary urgency 07/05/2016  . HNP (herniated nucleus pulposus), lumbar 09/23/2015  . Sinus congestion 07/30/2015  . Iron deficiency 07/30/2015  . Health maintenance examination 06/15/2015  . 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 Headache 03/09/2015  . Clavicle enlargement 12/13/2014  . Prediabetes 12/13/2014  . Plantar fasciitis, bilateral 09/11/2014  . Advanced care planning/counseling discussion 06/10/2014  . Medicare annual wellness visit, subsequent 06/10/2014  . Abnormal thyroid function test 06/10/2014  . Chronic pain syndrome 06/10/2014  . CFS (chronic fatigue syndrome) 04/08/2014  . Calculus of bile duct 09/17/2013  . Postmenopausal atrophic vaginitis 10/19/2012  . 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 Cervical disc disorder with radiculopathy of cervical region 05/28/2010  . Anemia of chronic disease 03/17/2009  . CHEST PAIN UNSPECIFIED 02/28/2008  . APHTHOUS ULCERS 01/31/2008  . INSOMNIA, CHRONIC 11/09/2007  . Drug-induced constipation 10/11/2007  . ALLERGIC RHINITIS 04/12/2007  . MDD (major depressive disorder), recurrent episode, moderate ([place]) 03/05/2007  . [name] syndrome 03/05/2007  . GERD 03/05/2007  . ROSACEA 03/05/2007  . NEURALGIA 03/05/2007  . 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 CYSTITIS, CHRONIC INTERSTITIAL 12/06/2006  . Fibromyalgia 12/06/2006    Past Medical History:  Diagnosis Date  . Abdominal pain last 4 months   and nausea also  . Allergy   . Anemia   . Anxiety   . 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 C6- C7 -hx. past fusion(bone graft used)  . Cholecystitis   . Chronic pain   . DDD (degenerative disc disease), lumbar 09/2015   dextroscoliosis with multilevel DDD and facet arthrosis most notable for R foraminal disc protrusion L4/5 producing severe R neural foraminal stenosis  abutting R L4 nerve root, moderate spinal canal and mild lat recesss and R neural foraminal stenosis L3/4 (MRI)  . Depression    bipolar depression  . Disorders of porphyrin metabolism   . Felon of finger of left hand 11/10/2016  . Fibromyalgia   . GERD (gastroesophageal reflux disease)   . Internal hemorrhoids   . Interstitial cystitis 06-06-12   hx.  . Irritable bowel syndrome   . PONV (postoperative nausea and vomiting)    now uses stomach blockers and no ponv  . 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 Raynauds disease    hx.  . Seronegative arthritis    [name]  . 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 CAD Father 54       MI, nonsmoker  . Esophageal cancer Father 93  . Stomach cancer Father   . Scleroderma Mother   . Hypertension Mother   . Esophageal cancer Paternal Grandfather   . Stomach cancer Paternal Grandfather   . Diabetes Maternal Grandmother   . Arthritis Brother   . Stroke Neg Hx   . Colon cancer Neg Hx   . Rectal cancer Neg Hx  Past Surgical History:  Procedure Laterality Date  . ANTERIOR CERVICAL DECOMP/DISCECTOMY FUSION  2004   C5/6, C6/7  . ANTERIOR CERVICAL DECOMP/DISCECTOMY FUSION  02/2016   C3/4, C4/5 with plating [name])  . AUGMENTATION MAMMAPLASTY Bilateral 03/25/2010  . BREAST ENHANCEMENT SURGERY  2010  . BREAST IMPLANT EXCHANGE  10/2014   exchange saline implants, B mastopexy/capsulorraphy ([facility])  . BUNIONECTOMY Bilateral yrs ago  . [place]   x 1  . CHOLECYSTECTOMY  06/11/2012   Procedure: LAPAROSCOPIC CHOLECYSTECTOMY WITH INTRAOPERATIVE CHOLANGIOGRAM;  Surgeon: [name], MD,FACS;  Location: [facility] ORS;  Service: General;  Laterality: N/A;  . COLONOSCOPY  02/2018   done  for positive cologuard - WNL, rpt 10 yrs Fuller Plan)  . CYSTOSCOPY    . ERCP  05/22/2012   Procedure: ENDOSCOPIC RETROGRADE CHOLANGIOPANCREATOGRAPHY (ERCP);  Surgeon: [name], MD,FACG;  Location: [facility];  Service: Endoscopy;  Laterality: N/A;  . ERCP N/A 09/17/2013   Procedure: ENDOSCOPIC RETROGRADE CHOLANGIOPANCREATOGRAPHY (ERCP);  Surgeon: [name], MD;  Location: [facility];  Service: Endoscopy;  Laterality: N/A;  . ERCP N/A 09/27/2018   Procedure: ENDOSCOPIC RETROGRADE CHOLANGIOPANCREATOGRAPHY (ERCP);  Surgeon: [name], MD;  Location: [facility];  Service: Endoscopy;  Laterality: N/A;  . ESOPHAGOGASTRODUODENOSCOPY  09/2016   WNL. esophagus dilated Fuller Plan)  . HEMORRHOID BANDING  09-23-13   --Dr. Greer Pickerel  . HERNIA REPAIR     inguinal  . HYSTEROSCOPY W/ ENDOMETRIAL ABLATION    . NASAL SINUS SURGERY     x5  . REMOVAL OF STONES  09/27/2018   Procedure: REMOVAL OF STONES;  Surgeon: [name], MD;  Location: [facility] END[facility];  Service: Endoscopy;;  . [name]  09/27/2018   Procedure: SPHINCTEROTOMY;  Surgeon: [name], MD;  Location: [facility] ENDOS[facility];  Service: Endoscopy;;  . 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 Influenza Split 05/04/2011, 04/04/2012  . Influenza Whole 05/25/2007, 04/28/2008, 04/30/2009, 03/26/2010  . Influenza,inj,Quad PF,6+ Mos 04/16/2015, 04/15/2016, 05/16/2017, 04/10/2018  . Influenza-Unspecified 03/25/2014  . MMR 11/28/2017  . Pneumococcal Conjugate-13 08/02/2013  . Pneumococcal Polysaccharide-23 07/30/2009  . Td 11/01/2005,  11/10/2016  . Zoster Recombinat (Shingrix) 07/21/2017, 11/03/2017     Objective: Vital Signs: BP 135/88 (BP Location: Left Arm, Patient Position: Sitting, Cuff Size: Normal)   Pulse (!) 109   Resp 12   Ht '5\' 4"'$  (1.626 m)   Wt 123 lb 6.4 oz (56 kg)   LMP 07/25/1998   BMI 21.18 kg/m    Physical Exam Vitals signs and nursing note reviewed.  Constitutional:      Appearance: She is well-developed.  HENT:     Head: Normocephalic and atraumatic.  Eyes:     Conjunctiva/sclera: Conjunctivae normal.  Neck:     Musculoskeletal: Normal range of motion.  Cardiovascular:     Rate and Rhythm: Normal rate and regular rhythm.     Heart sounds: Normal heart sounds.  Pulmonary:     Effort: Pulmonary effort is normal.     Breath sounds: Normal breath sounds.  Abdominal:  General: Bowel sounds are normal.     Palpations: Abdomen is soft.  Lymphadenopathy:     Cervical: No cervical adenopathy.  Skin:    General: Skin is warm and dry.     Capillary Refill: Capillary refill takes less than 2 seconds.  Neurological:     Mental Status: She is alert and oriented to person, place, and time.  Psychiatric:        Behavior: Behavior normal.      Musculoskeletal Exam: C-spine limited range of motion with discomfort.  Thoracic lumbar spine limited range of motion.  Scoliosis noted.  Shoulder joints, elbow joints, wrist joints, MCPs, PIPs, DIPs good range of motion no synovitis.  She has complete fist formation bilaterally.  She has DIP and PIP synovial thickening consistent with osteoarthritis of bilateral hands.  Hip joints good range of motion.  She has tenderness over bilateral trochanter bursa.  Knee joints, ankle joints, MTPs, PIPs and DIPs good range of motion no synovitis.  No warmth or effusion of knee joints.  No tenderness or swelling of ankle joints.  CDAI Exam: CDAI Score: Not documented Patient Global Assessment: Not documented; Provider Global Assessment: Not documented Swollen: Not  documented; Tender: Not documented Joint Exam   Not documented   There is currently no information documented on the homunculus. Go to the Rheumatology activity and complete the homunculus joint exam.  Investigation: No additional findings.  Imaging: Dg Ercp Biliary & Pancreatic Ducts  Result Date: 09/27/2018 CLINICAL DATA:  Choledocholithiasis, biliary dilatation EXAM: ERCP sphincterotomy and balloon extraction of CBD stone TECHNIQUE: Multiple spot images obtained with the fluoroscopic device and submitted for interpretation post-procedure. FLUOROSCOPY TIME:  Fluoroscopy Time:  2 minutes 1 second Radiation Exposure Index (if provided by the fluoroscopic device): 26 mGy Number of Acquired Spot Images: A 12 COMPARISON:  None. FINDINGS: Spot fluoroscopic intraoperative views demonstrate retrograde cholangiogram. Large filling defect within the proximal CBD at the level of the cystic duct clips. This results in mild proximal biliary dilatation. Balloon sweep performed for stone extraction. Balloon occlusion cholangiogram demonstrates no further stone disease. IMPRESSION: Positive exam for choledocholithiasis successfully removed with sphincterotomy and balloon sweep. These images were submitted for radiologic interpretation only. Please see the procedural report for the amount of contrast and the fluoroscopy time utilized. Electronically Signed   By: Jerilynn Mages.  Shick M.D.   On: 09/27/2018 12:52    Recent Labs: Lab Results  Component Value Date   WBC 8.0 09/25/2018   HGB 12.7 09/25/2018   PLT 480.0 (H) 09/25/2018   NA 136 09/25/2018   K 4.6 09/25/2018   CL 100 09/25/2018   CO2 29 09/25/2018   GLUCOSE 93 09/25/2018   BUN 8 09/25/2018   CREATININE 0.57 09/25/2018   BILITOT 0.4 09/25/2018   ALKPHOS 184 (H) 09/25/2018   AST 21 09/25/2018   ALT 22 09/25/2018   PROT 6.5 09/25/2018   ALBUMIN 4.3 09/25/2018   CALCIUM 9.0 09/25/2018   GFRAA >89 04/11/2013    Speciality Comments: No specialty  comments available.  Procedures:  Large Joint Inj: bilateral greater trochanter on 10/01/2018 10:36 AM Indications: pain Details: 27 G 1.5 in needle, lateral approach  Arthrogram: No  Medications (Right): 1.5 mL lidocaine 1 %; 40 mg triamcinolone acetonide 40 MG/ML Aspirate (Right): 0 mL Medications (Left): 1.5 mL lidocaine 1 %; 40 mg triamcinolone acetonide 40 MG/ML Aspirate (Left): 0 mL Outcome: tolerated well, no immediate complications Procedure, treatment alternatives, risks and benefits explained, specific risks discussed. Consent was given  by the patient. Immediately prior to procedure a time out was called to verify the correct patient, procedure, equipment, support staff and site/side marked as required. Patient was prepped and draped in the usual sterile fashion.     Allergies: Codeine phosphate; Cymbalta [duloxetine hcl]; Celebrex [celecoxib]; Inh [isoniazid]; Lithium; Nucynta [tapentadol]; Silenor [doxepin hcl]; Meperidine hcl; and Sulfamethoxazole   Assessment / Plan:     Visit Diagnoses: Fibromyalgia: She has generalized hyperalgesia and positive tender points on exam.  She presents today with bilateral trochanteric bursitis.  She requested bilateral trochanteric bursa cortisone injections.  A referral to Ileana Roup was placed today. She has chronic fatigue and insomnia. She has been having interrupted sleep at night due to bilateral trochanteric bursitis and lower back pain. We discussed the importance of regular exercise and good sleep hygiene. She will continue on Lyrica 150 mg BID.  She does not need a refill.  She will follow up in 6 months.   Chronic pain syndrome: She takes Lyrica 150 mg BID.  She does not need any refills at this time.   CFS (chronic fatigue syndrome): She continues to have chronic fatigue.  She has had worsening fatigue over the past 6 weeks due to recurrent infections.   DDD (degenerative disc disease), cervical: s/p fusion-She has limited ROM with  discomfort.  No symptoms of radiculopathy at this time. She is followed by neurologist.   DDD (degenerative disc disease), lumbar: She has chronic pain and is followed by her neurologist.  She has scoliosis and hx of herniated discs.   She is not a surgical candidate.  She has attended PT in the past.  She continues to work on stretch and strengthening exercises.    Trigger finger, left middle finger: Resolved   Primary osteoarthritis of both knees: No warmth or effusion.  Good ROM with no discomfort. She had bilateral cortisone injections on 07/26/18 that provided significant pain relief.   Trochanteric bursitis of both hips: She has tenderness over bilateral trochanteric bursa. She has been having worsening pain over the past several weeks.  She requested bilateral cortisone injections.  She tolerated the procedure well.  Procedure notes completed above.  Aftercare discussed.  A referral to PT was placed today.    [name] disease without gangrene -She continues to have intermittent symptoms of raynaud's. She has no digital ulcerations or signs of gangrene. At her last visit on 07/26/18 she was experiencing worsening symptoms of [name] and we tried  increasing her dose of Norvasc to 7.5 mg by mouth daily and continued on aspirin 81 mg.  She has noticed improvement since increasing the dose of Norvasc.  She reports that the nitroglycerine ointment is effective but has been causing nausea and lightheadedness.  She can discontinue using nitroglycerin ointment, especially since the weather is warming up.  She was encouraged to keep her core body temperature warm and to wear gloves regularly. She was advised to notify us if she develops any signs of digital ulcerations or signs of gangrene.    Plantar fasciitis, bilateral - Resolved   Other medical conditions are listed as follows:   H/O oral aphthous ulcers  Prediabetes  MDD (major depressive disorder), recurrent episode, moderate ([facility])  History  of gastroesophageal reflux (GERD)  History of bipolar disorder  Hx of porphyria  Positive QuantiFERON-TB Gold test   Orders: Orders Placed This Encounter  Procedures  . Large Joint Inj: bilateral greater trochanter  . Ambulatory referral to Physical Therapy   No orders of  the defined types were placed in this encounter.   Face-to-face time spent with patient was 30 minutes. Greater than 50% of time was spent in counseling and coordination of care.  Follow-Up Instructions: Return in about 6 months (around 04/03/2019) for Fibromyalgia, Osteoarthritis, DDD.   Ofilia Neas, PA-C  Note - This record has been created using Dragon software.  Chart creation errors have been sought, but may not always  have been located. Such creation errors do not reflect on  the standard of medical care.

## 2018-09-26 NOTE — Telephone Encounter (Signed)
Pt called to inform that her insurance told her that PA for Movantik was denied so she has to stay on Linzess unless Dr. Fuller Plan wants to try something different. She would like a call back.

## 2018-09-27 ENCOUNTER — Ambulatory Visit (HOSPITAL_COMMUNITY): Payer: Medicare Other | Admitting: Anesthesiology

## 2018-09-27 ENCOUNTER — Other Ambulatory Visit: Payer: Self-pay

## 2018-09-27 ENCOUNTER — Ambulatory Visit (HOSPITAL_COMMUNITY)
Admission: RE | Admit: 2018-09-27 | Discharge: 2018-09-27 | Disposition: A | Discharge status: Home / Self Care | Payer: Medicare Other | Attending: Gastroenterology | Admitting: Gastroenterology

## 2018-09-27 ENCOUNTER — Ambulatory Visit: Payer: Medicare Other | Admitting: Gastroenterology

## 2018-09-27 ENCOUNTER — Encounter (HOSPITAL_COMMUNITY): Payer: Self-pay | Admitting: Certified Registered Nurse Anesthetist

## 2018-09-27 ENCOUNTER — Ambulatory Visit (HOSPITAL_COMMUNITY): Payer: Medicare Other

## 2018-09-27 ENCOUNTER — Encounter (HOSPITAL_COMMUNITY)
Admission: RE | Disposition: A | Discharge status: Home / Self Care | Payer: Self-pay | Source: Home / Self Care | Attending: Gastroenterology

## 2018-09-27 ENCOUNTER — Ambulatory Visit: Payer: Medicare Other | Admitting: Physician Assistant

## 2018-09-27 DIAGNOSIS — K8051 Calculus of bile duct without cholangitis or cholecystitis with obstruction: Secondary | ICD-10-CM

## 2018-09-27 DIAGNOSIS — I10 Essential (primary) hypertension: Secondary | ICD-10-CM | POA: Insufficient documentation

## 2018-09-27 DIAGNOSIS — Z79899 Other long term (current) drug therapy: Secondary | ICD-10-CM | POA: Insufficient documentation

## 2018-09-27 DIAGNOSIS — K831 Obstruction of bile duct: Secondary | ICD-10-CM

## 2018-09-27 DIAGNOSIS — R1013 Epigastric pain: Secondary | ICD-10-CM | POA: Diagnosis not present

## 2018-09-27 DIAGNOSIS — R748 Abnormal levels of other serum enzymes: Secondary | ICD-10-CM | POA: Diagnosis not present

## 2018-09-27 DIAGNOSIS — K219 Gastro-esophageal reflux disease without esophagitis: Secondary | ICD-10-CM | POA: Diagnosis not present

## 2018-09-27 DIAGNOSIS — K8050 Calculus of bile duct without cholangitis or cholecystitis without obstruction: Secondary | ICD-10-CM | POA: Insufficient documentation

## 2018-09-27 DIAGNOSIS — K5900 Constipation, unspecified: Secondary | ICD-10-CM | POA: Diagnosis not present

## 2018-09-27 DIAGNOSIS — D509 Iron deficiency anemia, unspecified: Secondary | ICD-10-CM | POA: Insufficient documentation

## 2018-09-27 DIAGNOSIS — R109 Unspecified abdominal pain: Secondary | ICD-10-CM | POA: Diagnosis present

## 2018-09-27 DIAGNOSIS — Z9049 Acquired absence of other specified parts of digestive tract: Secondary | ICD-10-CM | POA: Insufficient documentation

## 2018-09-27 DIAGNOSIS — R112 Nausea with vomiting, unspecified: Secondary | ICD-10-CM

## 2018-09-27 HISTORY — PX: REMOVAL OF STONES: SHX5545

## 2018-09-27 HISTORY — PX: ERCP: SHX5425

## 2018-09-27 HISTORY — PX: SPHINCTEROTOMY: SHX5544

## 2018-09-27 SURGERY — ERCP, WITH INTERVENTION IF INDICATED
Anesthesia: General

## 2018-09-27 MED ORDER — SUCCINYLCHOLINE CHLORIDE 20 MG/ML IJ SOLN
INTRAMUSCULAR | Status: DC | PRN
  Administered 2018-09-27: 80 mg via INTRAVENOUS

## 2018-09-27 MED ORDER — SODIUM CHLORIDE 0.9 % IV SOLN
1.5000 g | Freq: Once | INTRAVENOUS | Status: DC
  Filled 2018-09-27: qty 1.5

## 2018-09-27 MED ORDER — DEXAMETHASONE SODIUM PHOSPHATE 10 MG/ML IJ SOLN
INTRAMUSCULAR | Status: DC | PRN
  Administered 2018-09-27: 5 mg via INTRAVENOUS

## 2018-09-27 MED ORDER — ONDANSETRON HCL 4 MG/2ML IJ SOLN
INTRAMUSCULAR | Status: DC | PRN
  Administered 2018-09-27: 4 mg via INTRAVENOUS

## 2018-09-27 MED ORDER — INDOMETHACIN 50 MG RE SUPP
RECTAL | Status: DC | PRN
  Administered 2018-09-27: 100 mg via RECTAL

## 2018-09-27 MED ORDER — LACTATED RINGERS IV SOLN
INTRAVENOUS | Status: DC | PRN
  Administered 2018-09-27 (×2): via INTRAVENOUS

## 2018-09-27 MED ORDER — SODIUM CHLORIDE 0.9 % IV SOLN
INTRAVENOUS | Status: DC | PRN
  Administered 2018-09-27: 30 mL

## 2018-09-27 MED ORDER — PROPOFOL 10 MG/ML IV BOLUS
INTRAVENOUS | Status: DC | PRN
  Administered 2018-09-27: 120 mg via INTRAVENOUS

## 2018-09-27 MED ORDER — LIDOCAINE 2% (20 MG/ML) 5 ML SYRINGE
INTRAMUSCULAR | Status: DC | PRN
  Administered 2018-09-27: 60 mg via INTRAVENOUS

## 2018-09-27 MED ORDER — GLUCAGON HCL RDNA (DIAGNOSTIC) 1 MG IJ SOLR
INTRAMUSCULAR | Status: DC | PRN
  Administered 2018-09-27: .25 mL via INTRAVENOUS

## 2018-09-27 MED ORDER — SODIUM CHLORIDE 0.9 % IV SOLN: INTRAVENOUS | Status: DC

## 2018-09-27 MED ORDER — MIDAZOLAM HCL 5 MG/5ML IJ SOLN
INTRAMUSCULAR | Status: DC | PRN
  Administered 2018-09-27: 2 mg via INTRAVENOUS

## 2018-09-27 MED ORDER — FENTANYL CITRATE (PF) 100 MCG/2ML IJ SOLN
INTRAMUSCULAR | Status: DC | PRN
  Administered 2018-09-27: 50 ug via INTRAVENOUS

## 2018-09-27 MED ORDER — INDOMETHACIN 50 MG RE SUPP
RECTAL | Status: AC
  Filled 2018-09-27: qty 2

## 2018-09-27 MED ORDER — MIDAZOLAM HCL 2 MG/2ML IJ SOLN
INTRAMUSCULAR | Status: AC
  Filled 2018-09-27: qty 2

## 2018-09-27 MED ORDER — INDOMETHACIN 50 MG RE SUPP: 100.0000 mg | Freq: Once | RECTAL | Status: DC

## 2018-09-27 MED ORDER — GLUCAGON HCL RDNA (DIAGNOSTIC) 1 MG IJ SOLR
INTRAMUSCULAR | Status: AC
  Filled 2018-09-27: qty 1

## 2018-09-27 MED ORDER — FENTANYL CITRATE (PF) 100 MCG/2ML IJ SOLN
INTRAMUSCULAR | Status: AC
  Filled 2018-09-27: qty 2

## 2018-09-27 MED ORDER — CIPROFLOXACIN IN D5W 400 MG/200ML IV SOLN
INTRAVENOUS | Status: DC | PRN
  Administered 2018-09-27: 400 mg via INTRAVENOUS

## 2018-09-27 MED ORDER — PHENYLEPHRINE 40 MCG/ML (10ML) SYRINGE FOR IV PUSH (FOR BLOOD PRESSURE SUPPORT)
PREFILLED_SYRINGE | INTRAVENOUS | Status: DC | PRN
  Administered 2018-09-27 (×2): 80 ug via INTRAVENOUS
  Administered 2018-09-27: 40 ug via INTRAVENOUS
  Administered 2018-09-27: 80 ug via INTRAVENOUS
  Administered 2018-09-27: 120 ug via INTRAVENOUS

## 2018-09-27 MED ORDER — PROPOFOL 10 MG/ML IV BOLUS
INTRAVENOUS | Status: AC
  Filled 2018-09-27: qty 20

## 2018-09-27 MED ORDER — CIPROFLOXACIN IN D5W 400 MG/200ML IV SOLN
INTRAVENOUS | Status: AC
  Filled 2018-09-27: qty 200

## 2018-09-27 NOTE — Anesthesia Preprocedure Evaluation (Addendum)
Anesthesia Evaluation  Patient identified by MRN, date of birth, ID band Patient awake    Reviewed: Allergy & Precautions, NPO status , Patient's Chart, lab work & pertinent test results, reviewed documented beta blocker date and time   History of Anesthesia Complications (+) PONV and history of anesthetic complications  Airway Mallampati: II  TM Distance: >3 FB Neck ROM: Full    Dental no notable dental hx. (+) Teeth Intact   Pulmonary neg pulmonary ROS,    Pulmonary exam normal        Cardiovascular hypertension, Pt. on medications Normal cardiovascular exam Rhythm:Regular Rate:Normal     Neuro/Psych  Headaches, PSYCHIATRIC DISORDERS Anxiety Depression ADHDChronic fatigue syndrome Stargardt's disease- macular degeneration  Neuromuscular disease    GI/Hepatic Neg liver ROS, GERD  Medicated and Controlled,Choledocholithiasis Elevated alkaline phosphatase   Endo/Other  negative endocrine ROS  Renal/GU negative Renal ROS   Interstitial cystitis    Musculoskeletal  (+) Arthritis , Osteoarthritis,  Fibromyalgia -, narcotic dependentScoliosis DDD lumbar spine   Abdominal   Peds  Hematology  (+) anemia ,   Anesthesia Other Findings   Reproductive/Obstetrics                            Anesthesia Physical Anesthesia Plan  ASA: III  Anesthesia Plan: General   Post-op Pain Management:    Induction: Intravenous  PONV Risk Score and Plan: 4 or greater and Ondansetron, Dexamethasone, Scopolamine patch - Pre-op, Midazolam and Treatment may vary due to age or medical condition  Airway Management Planned: Oral ETT  Additional Equipment:   Intra-op Plan:   Post-operative Plan: Extubation in OR  Informed Consent: I have reviewed the patients History and Physical, chart, labs and discussed the procedure including the risks, benefits and alternatives for the proposed anesthesia with the  patient or authorized representative who has indicated his/her understanding and acceptance.     Dental advisory given  Plan Discussed with: CRNA and Surgeon  Anesthesia Plan Comments:         Anesthesia Quick Evaluation

## 2018-09-27 NOTE — Transfer of Care (Signed)
Immediate Anesthesia Transfer of Care Note  Patient: Rachel Duran  Procedure(s) Performed: ENDOSCOPIC RETROGRADE CHOLANGIOPANCREATOGRAPHY (ERCP) (N/A ) SPHINCTEROTOMY REMOVAL OF STONES  Patient Location: PACU  Anesthesia Type:General  Level of Consciousness: sedated, patient cooperative and responds to stimulation  Airway & Oxygen Therapy: Patient Spontanous Breathing and Patient connected to face mask oxygen  Post-op Assessment: Report given to RN and Post -op Vital signs reviewed and stable  Post vital signs: Reviewed and stable  Last Vitals:  Vitals Value Taken Time  BP 117/71 09/27/2018 12:26 PM  Temp    Pulse 93 09/27/2018 12:26 PM  Resp 9 09/27/2018 12:26 PM  SpO2 100 % 09/27/2018 12:26 PM  Vitals shown include unvalidated device data.  Last Pain:  Vitals:   09/27/18 1226  TempSrc: Oral  PainSc: 0-No pain         Complications: No apparent anesthesia complications

## 2018-09-27 NOTE — Anesthesia Procedure Notes (Signed)
Procedure Name: Intubation Performed by: Amaira Safley J, CRNA Pre-anesthesia Checklist: Patient identified, Emergency Drugs available, Suction available, Patient being monitored and Timeout performed Patient Re-evaluated:Patient Re-evaluated prior to induction Oxygen Delivery Method: Circle system utilized Preoxygenation: Pre-oxygenation with 100% oxygen Induction Type: IV induction Ventilation: Mask ventilation without difficulty Laryngoscope Size: Mac and 4 Grade View: Grade I Tube type: Oral Tube size: 7.0 mm Number of attempts: 1 Airway Equipment and Method: Stylet Placement Confirmation: ETT inserted through vocal cords under direct vision,  positive ETCO2 and breath sounds checked- equal and bilateral Secured at: 21 cm Tube secured with: Tape Dental Injury: Teeth and Oropharynx as per pre-operative assessment        

## 2018-09-27 NOTE — Discharge Instructions (Signed)
YOU HAD AN ENDOSCOPIC PROCEDURE TODAY: Refer to the procedure report and other information in the discharge instructions given to you for any specific questions about what was found during the examination. If this information does not answer your questions, please call [facility] office at [phone] to clarify.  ° °YOU SHOULD EXPECT: Some feelings of bloating in the abdomen. Passage of more gas than usual. Walking can help get rid of the air that was put into your GI tract during the procedure and reduce the bloating.. ° °DIET: Your first meal following the procedure should be a light meal and then it is ok to progress to your normal diet. A half-sandwich or bowl of soup is an example of a good first meal. Heavy or fried foods are harder to digest and may make you feel nauseous or bloated. Drink plenty of fluids but you should avoid alcoholic beverages for 24 hours. If you had a esophageal dilation, please see attached instructions for diet.   ° °ACTIVITY: Your care partner should take you home directly after the procedure. You should plan to take it easy, moving slowly for the rest of the day. You can resume normal activity the day after the procedure however YOU SHOULD NOT DRIVE, use power tools, machinery or perform tasks that involve climbing or major physical exertion for 24 hours (because of the sedation medicines used during the test).  ° °SYMPTOMS TO REPORT IMMEDIATELY: °A gastroenterologist can be reached at any hour. Please call [phone]  for any of the following symptoms:  ° °Following upper endoscopy (EGD, EUS, ERCP, esophageal dilation) °Vomiting of blood or coffee ground material  °New, significant abdominal pain  °New, significant chest pain or pain under the shoulder blades  °Painful or persistently difficult swallowing  °New shortness of breath  °Black, tarry-looking or red, bloody stools ° °FOLLOW UP:  °If any biopsies were taken you will be contacted by phone or by letter within the next 1-3  weeks. Call [phone]  if you have not heard about the biopsies in 3 weeks.  °Please also call with any specific questions about appointments or follow up tests. ° °

## 2018-09-27 NOTE — Op Note (Signed)
[facility] Patient Name: Rachel Duran Procedure Date: 09/27/2018 MRN: [id number] Attending MD: [name] , MD Date of Birth: 1956/04/05 CSN: [id number] Age: 63 Admit Type: Inpatient Procedure:                ERCP Indications:              Abdominal pain of suspected biliary origin,                            Suspected bile duct stone(s), Elevated liver enzymes Providers:                Pricilla Riffle. Fuller Plan, MD, Carmie End, RN, Cletis Athens, Technician, Herbie Drape, CRNA Referring MD:             Ria Bush, MD Medicines:                General Anesthesia Complications:            No immediate complications. Estimated Blood Loss:     Estimated blood loss: none. Procedure:                Pre-Anesthesia Assessment:                           - Prior to the procedure, a History and Physical                            was performed, and patient medications and                            allergies were reviewed. The patient's tolerance of                            previous anesthesia was also reviewed. The risks                            and benefits of the procedure and the sedation                            options and risks were discussed with the patient.                            All questions were answered, and informed consent                            was obtained. Prior Anticoagulants: The patient has                            taken no previous anticoagulant or antiplatelet                            agents. ASA Grade Assessment: III - A patient with  severe systemic disease. After reviewing the risks                            and benefits, the patient was deemed in                            satisfactory condition to undergo the procedure.                           After obtaining informed consent, the scope was                            passed under direct vision. Throughout the                    procedure, the patient's blood pressure, pulse, and                            oxygen saturations were monitored continuously. The                            TJF-Q180V ([id number] Olympus duodenoscope was                            introduced through the mouth, and used to inject                            contrast into and used to inject contrast into the                            bile duct. The ERCP was accomplished without                            difficulty. The patient tolerated the procedure                            well. Scope In: Scope Out: Findings:      A scout film of the abdomen was obtained. Surgical clips, consistent       with a previous cholecystectomy, were seen in the area of the right       upper quadrant of the abdomen. The esophagus was successfully intubated       under direct vision. The scope was advanced to the major papilla in the       descending duodenum without detailed examination of the pharynx, larynx       and associated structures, and upper GI tract. The upper GI tract was       grossly normal except for a prior sphincterotomy. A straight Roadrunner       wire was passed into the biliary tree. The short-nosed traction       sphincterotome was passed over the guidewire and the bile duct was then       deeply cannulated. Contrast was injected. I personally interpreted the       bile duct images. Ductal flow of contrast was adequate. Air was noted in       the intrahepatic ducts. The right intrahepatics  did not fill completely       with contrast. The common bile duct was diffusely dilated, with a stone       causing an obstruction. The largest diameter was 13 mm. A       cholecystectomy had been performed. Choledocholithiasis was found. The       common bile duct contained one soft stone, which was 12 mm in diameter.       A 4 mm biliary sphincterotomy extension was made with a traction       (standard) sphincterotome using ERBE  electrocautery to allow removal of       the large stone. There was no post-sphincterotomy bleeding. The biliary       tree was swept with a 12 mm balloon starting at the bifurcation several       times. Sludge was swept from the duct. One soft stone was removed. No       stones or filling defects remained on the final occlusion cholangiogram.       Good biliary drainage noted following the final cholangiogram. The PD       was not cannulated or injected by intention. Impression:               - The common bile duct was dilated, with a stone                            causing an obstruction.                           - Air in the intrahepatic ducts.                           - Prior cholecystectomy.                           - Choledocholithiasis and biliary sludge was found.                           - Complete removal was accomplished by biliary                            sphincterotomy extension and balloon extraction. Moderate Sedation:      Not Applicable - Patient had care per Anesthesia. Recommendation:           - Avoid aspirin and nonsteroidal anti-inflammatory                            medicines for 1 week following Indocin given at                            ERCP.                           - The patient will be observed post-procedure,                            until all discharge criteria are met.                           -  Clear liquid diet today, then advance as                            tolerated to soft diet tomorrow.                           - LFTs in 1 month.                           - Return to GI office in 1 month. Procedure Code(s):        --- Professional ---                           [phone], Endoscopic retrograde                            cholangiopancreatography (ERCP); with removal of                            calculi/debris from biliary/pancreatic duct(s)                           43262, Endoscopic retrograde                             cholangiopancreatography (ERCP); with                            sphincterotomy/papillotomy Diagnosis Code(s):        --- Professional ---                           K80.51, Calculus of bile duct without cholangitis                            or cholecystitis with obstruction                           Z90.49, Acquired absence of other specified parts                            of digestive tract                           R10.9, Unspecified abdominal pain                           R74.8, Abnormal levels of other serum enzymes CPT copyright 2018 [employer] Association. All rights reserved. The codes documented in this report are preliminary and upon coder review may  be revised to meet current compliance requirements. [name], MD 09/27/2018 12:18:21 PM This report has been signed electronically. Number of Addenda: 0

## 2018-09-27 NOTE — Anesthesia Postprocedure Evaluation (Signed)
Anesthesia Post Note  Patient: Rachel Duran  Procedure(s) Performed: ENDOSCOPIC RETROGRADE CHOLANGIOPANCREATOGRAPHY (ERCP) (N/A ) SPHINCTEROTOMY REMOVAL OF STONES     Patient location during evaluation: PACU Anesthesia Type: General Level of consciousness: awake and alert and oriented Pain management: pain level controlled Vital Signs Assessment: post-procedure vital signs reviewed and stable Respiratory status: spontaneous breathing, nonlabored ventilation and respiratory function stable Cardiovascular status: blood pressure returned to baseline and stable Postop Assessment: no apparent nausea or vomiting Anesthetic complications: no    Last Vitals:  Vitals:   09/27/18 1226 09/27/18 1230  BP: 117/71 104/62  Pulse: 84 83  Resp: 12 12  Temp: 36.9 C   SpO2: 100% 99%    Last Pain:  Vitals:   09/27/18 1226  TempSrc: Oral  PainSc: 0-No pain                 Jakeline Dave A.

## 2018-09-27 NOTE — Interval H&P Note (Signed)
History and Physical Interval Note:  09/27/2018 11:09 AM  Rachel Duran  has presented today for surgery, with the diagnosis of biliary pain, elevated Alk Phos, R/O CBD stone  The various methods of treatment have been discussed with the patient and family. After consideration of risks, benefits and other options for treatment, the patient has consented to  Procedure(s): ENDOSCOPIC RETROGRADE CHOLANGIOPANCREATOGRAPHY (ERCP) (N/A) as a surgical intervention .  The patient's history has been reviewed, patient examined, no change in status, stable for surgery.  I have reviewed the patient's chart and labs.  Questions were answered to the patient's satisfaction.     Pricilla Riffle. Fuller Plan

## 2018-09-28 ENCOUNTER — Other Ambulatory Visit: Payer: Self-pay | Admitting: Obstetrics and Gynecology

## 2018-09-28 DIAGNOSIS — Z1231 Encounter for screening mammogram for malignant neoplasm of breast: Secondary | ICD-10-CM

## 2018-09-29 ENCOUNTER — Encounter (HOSPITAL_COMMUNITY): Payer: Self-pay | Admitting: Gastroenterology

## 2018-10-01 ENCOUNTER — Ambulatory Visit (INDEPENDENT_AMBULATORY_CARE_PROVIDER_SITE_OTHER): Payer: Medicare Other | Admitting: Physician Assistant

## 2018-10-01 ENCOUNTER — Encounter: Payer: Self-pay | Admitting: Physician Assistant

## 2018-10-01 VITALS — BP 135/88 | HR 109 | Resp 12 | Ht 64.0 in | Wt 123.4 lb

## 2018-10-01 DIAGNOSIS — M7061 Trochanteric bursitis, right hip: Secondary | ICD-10-CM | POA: Diagnosis not present

## 2018-10-01 DIAGNOSIS — R5382 Chronic fatigue, unspecified: Secondary | ICD-10-CM | POA: Diagnosis not present

## 2018-10-01 DIAGNOSIS — M797 Fibromyalgia: Secondary | ICD-10-CM

## 2018-10-01 DIAGNOSIS — M5030 Other cervical disc degeneration, unspecified cervical region: Secondary | ICD-10-CM

## 2018-10-01 DIAGNOSIS — G894 Chronic pain syndrome: Secondary | ICD-10-CM

## 2018-10-01 DIAGNOSIS — M170 Bilateral primary osteoarthritis of knee: Secondary | ICD-10-CM

## 2018-10-01 DIAGNOSIS — Z8719 Personal history of other diseases of the digestive system: Secondary | ICD-10-CM

## 2018-10-01 DIAGNOSIS — M7062 Trochanteric bursitis, left hip: Secondary | ICD-10-CM

## 2018-10-01 DIAGNOSIS — M5136 Other intervertebral disc degeneration, lumbar region: Secondary | ICD-10-CM

## 2018-10-01 DIAGNOSIS — M65332 Trigger finger, left middle finger: Secondary | ICD-10-CM

## 2018-10-01 DIAGNOSIS — R7612 Nonspecific reaction to cell mediated immunity measurement of gamma interferon antigen response without active tuberculosis: Secondary | ICD-10-CM

## 2018-10-01 DIAGNOSIS — Z8659 Personal history of other mental and behavioral disorders: Secondary | ICD-10-CM

## 2018-10-01 DIAGNOSIS — F331 Major depressive disorder, recurrent, moderate: Secondary | ICD-10-CM

## 2018-10-01 DIAGNOSIS — I7300 Raynaud's syndrome without gangrene: Secondary | ICD-10-CM

## 2018-10-01 DIAGNOSIS — Z8639 Personal history of other endocrine, nutritional and metabolic disease: Secondary | ICD-10-CM

## 2018-10-01 DIAGNOSIS — G9332 Myalgic encephalomyelitis/chronic fatigue syndrome: Secondary | ICD-10-CM

## 2018-10-01 DIAGNOSIS — M722 Plantar fascial fibromatosis: Secondary | ICD-10-CM

## 2018-10-01 DIAGNOSIS — R7303 Prediabetes: Secondary | ICD-10-CM

## 2018-10-01 MED ORDER — LIDOCAINE HCL 1 % IJ SOLN
1.5000 mL | INTRAMUSCULAR | Status: AC | PRN
  Administered 2018-10-01: 1.5 mL

## 2018-10-01 MED ORDER — TRIAMCINOLONE ACETONIDE 40 MG/ML IJ SUSP
40.0000 mg | INTRAMUSCULAR | Status: AC | PRN
  Administered 2018-10-01: 40 mg via INTRA_ARTICULAR

## 2018-10-03 ENCOUNTER — Encounter: Payer: Self-pay | Admitting: Family Medicine

## 2018-10-03 ENCOUNTER — Ambulatory Visit (INDEPENDENT_AMBULATORY_CARE_PROVIDER_SITE_OTHER): Payer: Medicare Other | Admitting: Family Medicine

## 2018-10-03 ENCOUNTER — Other Ambulatory Visit: Payer: Self-pay

## 2018-10-03 VITALS — BP 134/74 | HR 110 | Temp 98.6°F | Ht 64.5 in | Wt 121.4 lb

## 2018-10-03 DIAGNOSIS — G8929 Other chronic pain: Secondary | ICD-10-CM

## 2018-10-03 DIAGNOSIS — G4700 Insomnia, unspecified: Secondary | ICD-10-CM | POA: Diagnosis not present

## 2018-10-03 DIAGNOSIS — G894 Chronic pain syndrome: Secondary | ICD-10-CM

## 2018-10-03 DIAGNOSIS — M51369 Other intervertebral disc degeneration, lumbar region without mention of lumbar back pain or lower extremity pain: Secondary | ICD-10-CM

## 2018-10-03 DIAGNOSIS — K5903 Drug induced constipation: Secondary | ICD-10-CM

## 2018-10-03 DIAGNOSIS — J309 Allergic rhinitis, unspecified: Secondary | ICD-10-CM

## 2018-10-03 DIAGNOSIS — K831 Obstruction of bile duct: Secondary | ICD-10-CM

## 2018-10-03 DIAGNOSIS — R0981 Nasal congestion: Secondary | ICD-10-CM

## 2018-10-03 DIAGNOSIS — M5010 Cervical disc disorder with radiculopathy, unspecified cervical region: Secondary | ICD-10-CM

## 2018-10-03 DIAGNOSIS — K8051 Calculus of bile duct without cholangitis or cholecystitis with obstruction: Secondary | ICD-10-CM

## 2018-10-03 DIAGNOSIS — M5030 Other cervical disc degeneration, unspecified cervical region: Secondary | ICD-10-CM

## 2018-10-03 DIAGNOSIS — M797 Fibromyalgia: Secondary | ICD-10-CM

## 2018-10-03 DIAGNOSIS — M5136 Other intervertebral disc degeneration, lumbar region: Secondary | ICD-10-CM

## 2018-10-03 MED ORDER — ESZOPICLONE 3 MG PO TABS: 3.0000 mg | ORAL_TABLET | Freq: Every day | ORAL | 0 refills | Status: DC

## 2018-10-03 MED ORDER — PREDNISONE 20 MG PO TABS: ORAL_TABLET | ORAL | 0 refills | Status: DC

## 2018-10-03 NOTE — Patient Instructions (Signed)
Prednisone course for sinus symptoms Continue kadian. lunesta refilled. Continue pushng water Hopefully trulance will help constipation.  Continue flonase.

## 2018-10-03 NOTE — Assessment & Plan Note (Signed)
linzess lost effect. movantik effective but not covered by insurance. Trying trulance through GI. Continues miralax, MOM, benefiber

## 2018-10-03 NOTE — Progress Notes (Signed)
BP 134/74 (BP Location: Right Arm, Patient Position: Sitting, Cuff Size: Normal)   Pulse (!) 110   Temp 98.6 F (37 C) (Oral)   Ht 5' 4.5" (1.638 m)   Wt 121 lb 6 oz (55.1 kg)   LMP 07/25/1998   SpO2 98%   BMI 20.51 kg/m    CC: 3 mo chronic pain visit Subjective:    Patient ID: Rachel Duran, female    DOB: [birth year], 63 y.o.   MRN: [id number]  HPI: Rachel Duran is a 63 y.o. female presenting on 10/03/2018 for Chronic Pain Management (Here for 3 mo f/u.) and Medication Refill (Requests Lunesta refill to be put on hold.)   Has not felt well the last 5 weeks - viral URI then obstructive CBD stone, then trochanteric bursitis s/p steroid injections 2d ago.   Persistent lack of taste/smell after viral illness, persistent L earache/muffled hearing. She has started using flonase. Saw ENT for similar issue last year, treated with flonase, antihistamine and prednisone course. No significant congestion, facial pressure or headache. No cough. No fever.   She is just now starting to feel better.   Chronic pain stemming from known cervical and lumbar DDD. Embeda embeda 80/3.2mg  no longer manufactured. MS contin did not provide any pain relief. Current pain regimen is Kadian ER 40mg  BID #60/mo. Also on tramadol #40 PRN. Tolerates medication well. Also uses indomethacin cream or voltaren gel PRN. Ongoing constipation - see below.   She also takes adderall 30mg  daily in am.   She just had ERCP by Dr Fuller Plan for chronic nausea with biliary stasis in elevated ALP, CBD stone causing obstruction was found s/p biliary sphincterotomy extension and balloon extraction. This is her 4th stone.   Worsening constipation - linzess seems to have lost effect. movantik very effective (samples) but unaffordable and insurance would not cover. Started on trulance through GI today - hopeful for benefit. Has been using miralax more regularly. Also continues MOM and benefiber  Saw rheum this week, note reviewed.  Fibromyalgia, OA, CFS. Received bilateral GTB injections.      Relevant past medical, surgical, family and social history reviewed and updated as indicated. Interim medical history since our last visit reviewed. Allergies and medications reviewed and updated. Outpatient Medications Prior to Visit  Medication Sig Dispense Refill  . acyclovir (ZOVIRAX) 800 MG tablet Take 0.5 tablets (400 mg total) by mouth daily. 45 tablet 1  . ALPRAZolam (XANAX) 1 MG tablet Take 0.5 mg by mouth at bedtime as needed for anxiety. With 1/2 tab at night time  0  . amLODipine (NORVASC) 5 MG tablet Take 1 tablet (5 mg total) by mouth daily. Take 1 and a half tablet (7.5 mg) by mouth daily (Patient taking differently: Take 7.5 mg by mouth daily. ) 45 tablet 2  . amphetamine-dextroamphetamine (ADDERALL XR) 30 MG 24 hr capsule TAKE 1 CAPSULE EVERY MORNING (Patient taking differently: Take 30 mg by mouth every morning. ) 90 capsule 0  . ARIPiprazole (ABILIFY) 5 MG tablet Take 5 mg by mouth daily.     Marland Kitchen aspirin EC 81 MG tablet Take 81 mg by mouth every other day.     . calcium-vitamin D (OSCAL WITH D) 500-200 MG-UNIT tablet Take 1 tablet by mouth 2 (two) times daily.    . cyanocobalamin (,VITAMIN B-12,) 1000 MCG/ML injection INJECT 1 ML INTRAMUSCULARLY EVERY 21 DAYS (Patient taking differently: Inject 1,000 mcg into the muscle every 21 ( twenty-one) days. ) 3 mL 1  .  cyclobenzaprine (FEXMID) 7.5 MG tablet Take 7.5 mg by mouth 2 (two) times daily as needed (migraine).    Marland Kitchen diclofenac sodium (VOLTAREN) 1 % GEL Apply three grams to three large joints up to three times a day (Patient taking differently: Apply 3 g topically 3 (three) times daily. Apply three grams to three large joints up to three times a day) 3 Tube 3  . esomeprazole (NEXIUM) 40 MG capsule TAKE 1 CAPSULE BY MOUTH TWICE DAILY BEFORE A MEAL (Patient taking differently: Take 40 mg by mouth 2 (two) times daily before a meal. ) 60 capsule 11  . Estradiol (YUVAFEM) 10  MCG TABS vaginal tablet Place 1 tablet (10 mcg total) vaginally 3 (three) times a week. 12 tablet 9  . ferrous sulfate 325 (65 FE) MG tablet Take 325 mg by mouth every Monday, Wednesday, and Friday.     . fluticasone (FLONASE) 50 MCG/ACT nasal spray USE 2 SPRAYS IN BOTH NOSTRILS DAILY (Patient taking differently: Place 2 sprays into both nostrils daily as needed (seasonal allergies). ) 16 g 5  . KADIAN 40 MG CP24 TAKE 1 CAPSULE BY MOUTH TWICE DAILY (Patient taking differently: Take 40 mg by mouth every 12 (twelve) hours. ) 60 capsule 0  . lamoTRIgine (LAMICTAL) 200 MG tablet Take 200 mg by mouth daily.    . magnesium hydroxide (MILK OF MAGNESIA) 400 MG/5ML suspension Take by mouth daily as needed for mild constipation.    Marland Kitchen MAGNESIUM PO Take 1,000 mg by mouth 3 (three) times daily.    . Manganese 50 MG TABS Take 50 mg by mouth daily.     . Multiple Vitamins-Minerals (PRESERVISION AREDS PO) Take 1 tablet by mouth 2 (two) times daily.    . nitroGLYCERIN (NITROGLYN) 2 % ointment Apply 1/4 inch to fingertips three times daily. 30 g 0  . NONFORMULARY OR COMPOUNDED ITEM Testosterone propionate 2% in white petrolatum, apply small amount once daily for 5 days per week. 60 each 1  . NONFORMULARY OR COMPOUNDED ITEM Indomethacin Gel 10% Apply to Affect Area up to twice daily 60 each 1  . ondansetron (ZOFRAN ODT) 4 MG disintegrating tablet Take 1 tablet (4 mg total) by mouth every 8 (eight) hours as needed for nausea or vomiting. 20 tablet 1  . OVER THE COUNTER MEDICATION Take 1 tablet by mouth daily. Ginger/Tumeric combination tablet    . pentosan polysulfate (ELMIRON) 100 MG capsule Take 100 mg by mouth 3 (three) times daily.    . phenazopyridine (PYRIDIUM) 100 MG tablet Take 1 tablet (100 mg total) by mouth 3 (three) times daily as needed for pain. 20 tablet 0  . Plecanatide (TRULANCE) 3 MG TABS Take 1 tablet by mouth daily. 30 tablet 11  . Polyethylene Glycol 3350 (MIRALAX PO) Take 17 g by mouth 3 (three)  times daily.     . pregabalin (LYRICA) 150 MG capsule Take 1 capsule (150 mg total) by mouth 2 (two) times daily. 180 capsule 0  . SF 5000 PLUS 1.1 % CREA dental cream Place 1 application onto teeth at bedtime.     . traMADol (ULTRAM) 50 MG tablet TAKE 1 TABLET BY MOUTH TWICE DAILY AS NEEDED (Patient taking differently: Take 50 mg by mouth 2 (two) times daily as needed for moderate pain. If adult CrCl > 30, max dose = 400 mg/day; if CrCl < 30, max dose = 200 mg/day and q12h interval.) 40 tablet 0  . ursodiol (ACTIGALL) 300 MG capsule Take 1 capsule (300 mg total)  by mouth 2 (two) times daily. 60 capsule 11  . Eszopiclone 3 MG TABS TAKE ONE TABLET BY MOUTH AT BEDTIME TAKEIMMEDIATELY BEFORE BEDTIME (Patient taking differently: Take 3 mg by mouth at bedtime. ) 90 tablet 0   No facility-administered medications prior to visit.      Per HPI unless specifically indicated in ROS section below Review of Systems Objective:    BP 134/74 (BP Location: Right Arm, Patient Position: Sitting, Cuff Size: Normal)   Pulse (!) 110   Temp 98.6 F (37 C) (Oral)   Ht 5' 4.5" (1.638 m)   Wt 121 lb 6 oz (55.1 kg)   LMP 07/25/1998   SpO2 98%   BMI 20.51 kg/m   Wt Readings from Last 3 Encounters:  10/03/18 121 lb 6 oz (55.1 kg)  10/01/18 123 lb 6.4 oz (56 kg)  09/27/18 126 lb (57.2 kg)    Physical Exam Vitals signs and nursing note reviewed.  Constitutional:      General: She is not in acute distress.    Appearance: She is well-developed.  HENT:     Head: Normocephalic and atraumatic.     Right Ear: Hearing, tympanic membrane, ear canal and external ear normal.     Left Ear: Hearing, tympanic membrane, ear canal and external ear normal.     Nose: No mucosal edema, congestion or rhinorrhea.     Right Sinus: Maxillary sinus tenderness present. No frontal sinus tenderness.     Left Sinus: Maxillary sinus tenderness present. No frontal sinus tenderness.     Mouth/Throat:     Mouth: Mucous membranes are  dry.     Pharynx: Oropharynx is clear. Uvula midline. No oropharyngeal exudate or posterior oropharyngeal erythema.     Tonsils: No tonsillar exudate.  Eyes:     General: No scleral icterus.    Conjunctiva/sclera: Conjunctivae normal.     Pupils: Pupils are equal, round, and reactive to light.  Neck:     Musculoskeletal: Normal range of motion and neck supple.  Cardiovascular:     Rate and Rhythm: Normal rate and regular rhythm.     Pulses: Normal pulses.     Heart sounds: Normal heart sounds. No murmur.  Pulmonary:     Effort: Pulmonary effort is normal. No respiratory distress.     Breath sounds: Normal breath sounds. No wheezing, rhonchi or rales.  Lymphadenopathy:     Cervical: No cervical adenopathy.  Skin:    General: Skin is warm and dry.     Findings: No rash.       Results for orders placed or performed in visit on 09/25/18  Lipase  Result Value Ref Range   Lipase 30.0 11.0 - 59.0 U/L  Comprehensive metabolic panel  Result Value Ref Range   Sodium 136 135 - 145 mEq/L   Potassium 4.6 3.5 - 5.1 mEq/L   Chloride 100 96 - 112 mEq/L   CO2 29 19 - 32 mEq/L   Glucose, Bld 93 70 - 99 mg/dL   BUN 8 6 - 23 mg/dL   Creatinine, Ser 0.57 0.40 - 1.20 mg/dL   Total Bilirubin 0.4 0.2 - 1.2 mg/dL   Alkaline Phosphatase 184 (H) 39 - 117 U/L   AST 21 0 - 37 U/L   ALT 22 0 - 35 U/L   Total Protein 6.5 6.0 - 8.3 g/dL   Albumin 4.3 3.5 - 5.2 g/dL   Calcium 9.0 8.4 - 10.5 mg/dL   GFR 107.95 >60.00 mL/min  CBC  with Differential/Platelet  Result Value Ref Range   WBC 8.0 4.0 - 10.5 K/uL   RBC 4.41 3.87 - 5.11 Mil/uL   Hemoglobin 12.7 12.0 - 15.0 g/dL   HCT 37.7 36.0 - 46.0 %   MCV 85.5 78.0 - 100.0 fl   MCHC 33.7 30.0 - 36.0 g/dL   RDW 13.5 11.5 - 15.5 %   Platelets 480.0 (H) 150.0 - 400.0 K/uL   Neutrophils Relative % 58.1 43.0 - 77.0 %   Lymphocytes Relative 33.1 12.0 - 46.0 %   Monocytes Relative 6.8 3.0 - 12.0 %   Eosinophils Relative 0.8 0.0 - 5.0 %   Basophils  Relative 1.2 0.0 - 3.0 %   Neutro Abs 4.7 1.4 - 7.7 K/uL   Lymphs Abs 2.7 0.7 - 4.0 K/uL   Monocytes Absolute 0.5 0.1 - 1.0 K/uL   Eosinophils Absolute 0.1 0.0 - 0.7 K/uL   Basophils Absolute 0.1 0.0 - 0.1 K/uL   Assessment & Plan:   Problem List Items Addressed This Visit    Sinus congestion   INSOMNIA, CHRONIC    lunesta refilled.       Fibromyalgia   Encounter for chronic pain management - Primary    [place] CSRS reviewed.  Continue Kadian which seems effective, with PRN tramadol.  Embeda discontinued      Drug-induced constipation    linzess lost effect. movantik effective but not covered by insurance. Trying trulance through GI. Continues miralax, MOM, benefiber      DDD (degenerative disc disease), lumbar   Relevant Medications   predniSONE (DELTASONE) 20 MG tablet   DDD (degenerative disc disease), cervical   Relevant Medications   predniSONE (DELTASONE) 20 MG tablet   Common bile duct (CBD) obstruction   Chronic pain syndrome    Doing better on Kadian 40mg  BID - continue.       Cervical disc disorder with radiculopathy of cervical region   Relevant Medications   Eszopiclone 3 MG TABS   Calculus of bile duct    Appreciate recent GI care. Recent ERCP with treatment of CBD obstructive stone.      Allergic rhinitis    Anticipate current symptoms are coming from persistent ETD after recent viral URI. No signs of serous otitis today. Will Rx short prednisone taper and rec continue flonase. Update with effect. No signs of bacterial infection.           Meds ordered this encounter  Medications  . Eszopiclone 3 MG TABS    Sig: Take 1 tablet (3 mg total) by mouth at bedtime. Take immediately before bedtime    Dispense:  90 tablet    Refill:  0  . predniSONE (DELTASONE) 20 MG tablet    Sig: Take two tablets daily for 3 days followed by one tablet daily for 3 days    Dispense:  9 tablet    Refill:  0   No orders of the defined types were placed in this  encounter.   Follow up plan: Return in about 3 months (around 01/03/2019) for follow up visit.  Ria Bush, MD

## 2018-10-03 NOTE — Assessment & Plan Note (Signed)
Doing better on Kadian 40mg  BID - continue.

## 2018-10-03 NOTE — Assessment & Plan Note (Signed)
Anticipate current symptoms are coming from persistent ETD after recent viral URI. No signs of serous otitis today. Will Rx short prednisone taper and rec continue flonase. Update with effect. No signs of bacterial infection.

## 2018-10-03 NOTE — Assessment & Plan Note (Addendum)
lunesta refilled 

## 2018-10-03 NOTE — Assessment & Plan Note (Signed)
Appreciate recent GI care. Recent ERCP with treatment of CBD obstructive stone.

## 2018-10-03 NOTE — Assessment & Plan Note (Addendum)
[place] CSRS reviewed.  Continue Kadian which seems effective, with PRN tramadol.  Embeda discontinued

## 2018-10-08 ENCOUNTER — Other Ambulatory Visit: Payer: Self-pay | Admitting: Family Medicine

## 2018-10-09 ENCOUNTER — Other Ambulatory Visit: Payer: Self-pay

## 2018-10-09 ENCOUNTER — Encounter: Payer: Self-pay | Admitting: Obstetrics and Gynecology

## 2018-10-09 MED ORDER — NONFORMULARY OR COMPOUNDED ITEM: 1 refills | Status: DC

## 2018-10-09 NOTE — Telephone Encounter (Signed)
Medication refill request: Testosterone propionate 2%  Last AEX:  01/17/18 Next AEX: 01/31/19 Last MMG (if hormonal medication request): 10/27/17 Bi-rads 1 Neg Refill authorized:  #60 with 1 RF

## 2018-10-10 ENCOUNTER — Other Ambulatory Visit: Payer: Self-pay | Admitting: Obstetrics and Gynecology

## 2018-10-10 NOTE — Telephone Encounter (Signed)
Patient called and said [place] does not have her prescription for testosterone propionate. Please see refill MyChart message from yesterday, 10/09/18

## 2018-10-10 NOTE — Telephone Encounter (Signed)
Verbal RX called to [facility]. Patient notified that pharmacy will call with Rx is ready.   Ok to close encounter.

## 2018-10-11 ENCOUNTER — Other Ambulatory Visit: Payer: Self-pay | Admitting: Family Medicine

## 2018-10-11 NOTE — Telephone Encounter (Signed)
Spoke with patient to get more details. Patient states she did pick up her refill on Kadian on 09/25/2018 and she is not out. But per her pharmacist there is starting to be shortage on medications like this one and it will take a while to order some more. Could take 1 week or possibly more to get medications in. Patient spoke with [facility] representative and patient was advised by them that they would override a early refill authorization so the pharmacy could go ahead and request shipment of [place] for the patient. Patient would not pick this up early and pharmacy would hold before refilling it if patient is not due yet. Patient states by the time shipment comes in she may already be do. Please review

## 2018-10-11 NOTE — Telephone Encounter (Signed)
Looks like last B12 level was checked in November 2018. Please review refill request

## 2018-10-11 NOTE — Telephone Encounter (Signed)
Pt request cb when Kadian has been filled. Pt does want name brand Kadian and pt said [employer] would issue an override for the Kadian prescription. Please advise.

## 2018-10-12 NOTE — Telephone Encounter (Signed)
Spoke with pt notifying her Dr. Darnell Level sent in refill.  Pt expresses her thanks.

## 2018-10-12 NOTE — Telephone Encounter (Signed)
Noted. Sent in. plz notify pt.

## 2018-10-15 ENCOUNTER — Other Ambulatory Visit: Payer: Self-pay | Admitting: Family Medicine

## 2018-10-15 MED ORDER — KADIAN 40 MG PO CP24: 1.0000 | ORAL_CAPSULE | Freq: Two times a day (BID) | ORAL | 0 refills | Status: DC

## 2018-10-15 NOTE — Telephone Encounter (Signed)
Name of Medication: Kadian Name of Pharmacy: [facility] or Written Date and Quantity: 10/12/18, #60 Last Office Visit and Type: 10/03/18, chronic pain f/u Next Office Visit and Type: 01/02/19, chronic pain f/u Last Controlled Substance Agreement Date: 03/14/17 Last UDS: 03/14/17

## 2018-10-15 NOTE — Telephone Encounter (Signed)
Spoke with pt notifying her refill was sent to be filled 10/25/18.  Pt verbalizes understanding and expresses her thanks.

## 2018-10-15 NOTE — Telephone Encounter (Signed)
Pt want to get her medication early for Kadian and was told by the pharmacy she need a new prescription and it need to state It is ok to dispense this medicine on(____)it has to have a date in the statement to fill it on. Please advise  [facility]/[address]/[place]

## 2018-10-15 NOTE — Telephone Encounter (Signed)
Sent in newly.

## 2018-10-18 ENCOUNTER — Ambulatory Visit: Payer: Medicare Other | Admitting: Gastroenterology

## 2018-10-22 ENCOUNTER — Telehealth: Payer: Self-pay

## 2018-10-22 NOTE — Telephone Encounter (Signed)
Informed patient that Dr. Fuller Plan does want to her to continue medication even if she is feeling better right now. Patient verbalized understanding.

## 2018-10-26 ENCOUNTER — Other Ambulatory Visit: Payer: Self-pay | Admitting: Physician Assistant

## 2018-10-26 NOTE — Telephone Encounter (Signed)
ok 

## 2018-10-26 NOTE — Telephone Encounter (Signed)
Last Visit: 10/01/18 Next Visit: 04/02/19   Okay to refill per Dr. Estanislado Pandy

## 2018-10-30 ENCOUNTER — Ambulatory Visit: Payer: Medicare Other | Admitting: Gastroenterology

## 2018-11-05 ENCOUNTER — Ambulatory Visit: Payer: Medicare Other

## 2018-11-08 ENCOUNTER — Ambulatory Visit: Payer: Medicare Other | Admitting: Gastroenterology

## 2018-11-09 ENCOUNTER — Telehealth: Payer: Self-pay | Admitting: Gastroenterology

## 2018-11-09 NOTE — Telephone Encounter (Signed)
Indomethacin suppositories were billed to her insurance as patient administered and are not covered .  She was billed $736 by [facility] billing.  She has tried to have a conversation with billing and has gotten no where.  [employer] tried to intervene on her behalf as well and was denied.  [facility] is billing this as self administered and it is not covered this way under her plan. She is very frustrated with the billing office she reports they are extremely rude, pushy, and very threatening.  [employer] suggested we try and get a prior auth for the suppositories.  I attempted through [employer] prior auth and suppository form is not covered under her plan on an outpatient basis.  Patient notified of the response.  She is encoraged to report her experience to the office of patient experience. She thanked me for my time.

## 2018-11-09 NOTE — Telephone Encounter (Signed)
Patient had bile duct stones removed in March and she has gotten a Engineer, technical sales from insurance about the self administered indomethacin suppositories. She is upset with the high cost.

## 2018-11-09 NOTE — Telephone Encounter (Signed)
Patient call said she needs a pre authorization for indomethacin.

## 2018-11-23 IMAGING — US US ABDOMEN COMPLETE
1 series · 14 of 25 positions shown · non-contrast
Comparison: 10/07/2014

CLINICAL DATA: Abdominal pain for 4 weeks.

EXAM:
ABDOMEN ULTRASOUND COMPLETE

[Series 1: us abdomen complete · 0.22mm/px · 14 of 80 slices shown]
[im 1/80]
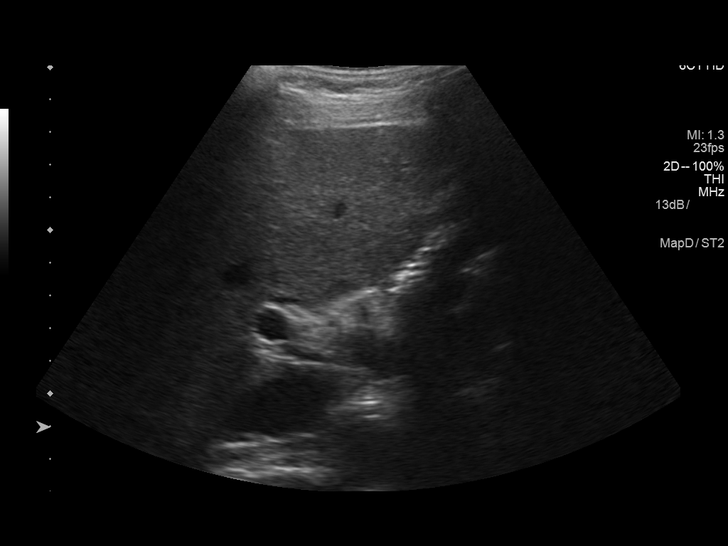
[im 7/80]
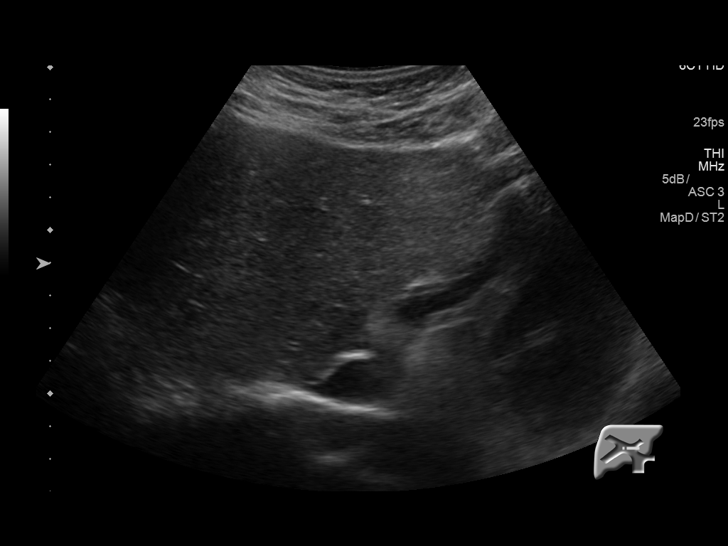
[im 14/80]
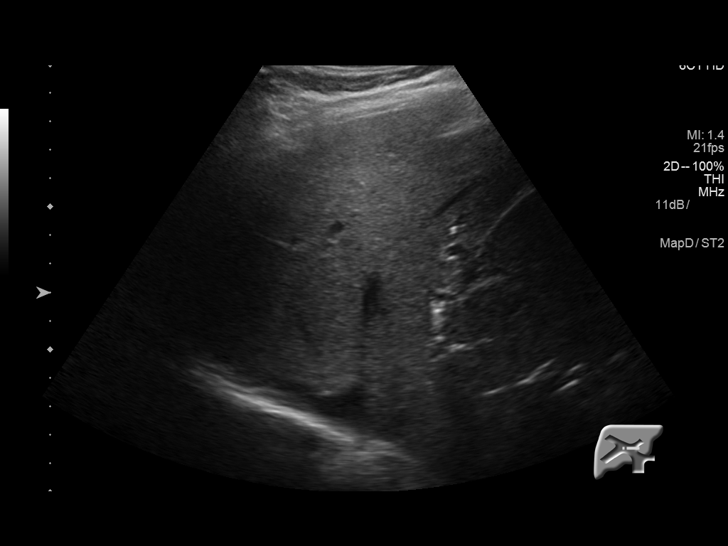
[im 20/80]
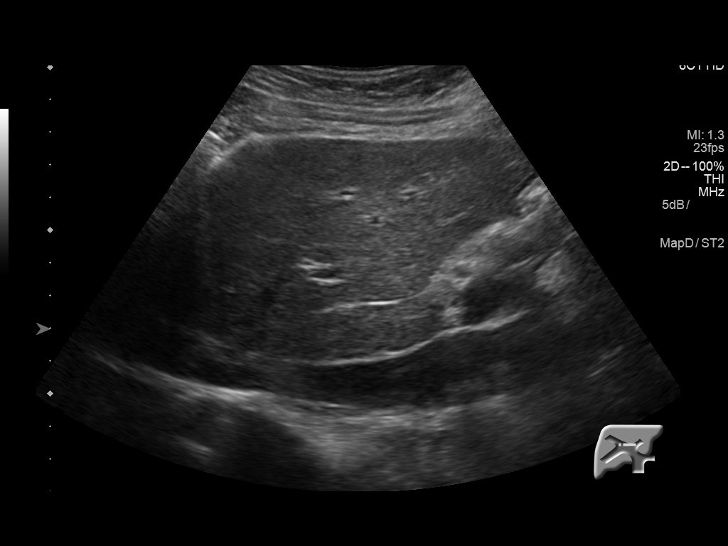
[im 27/80]
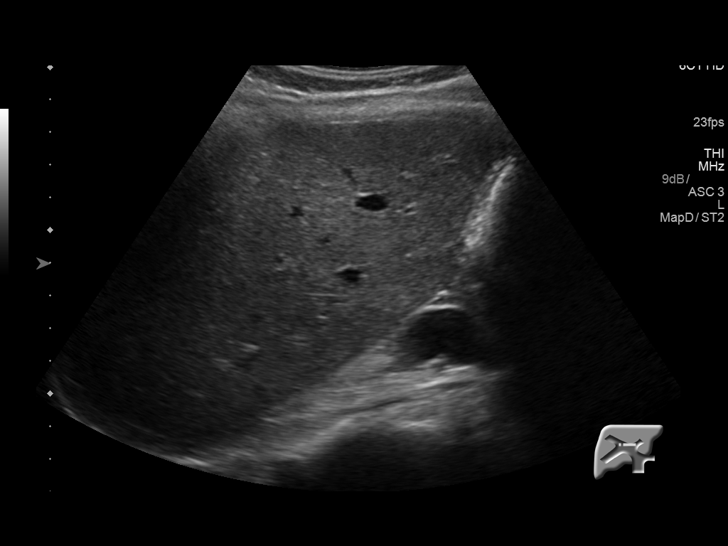
[im 30/80]
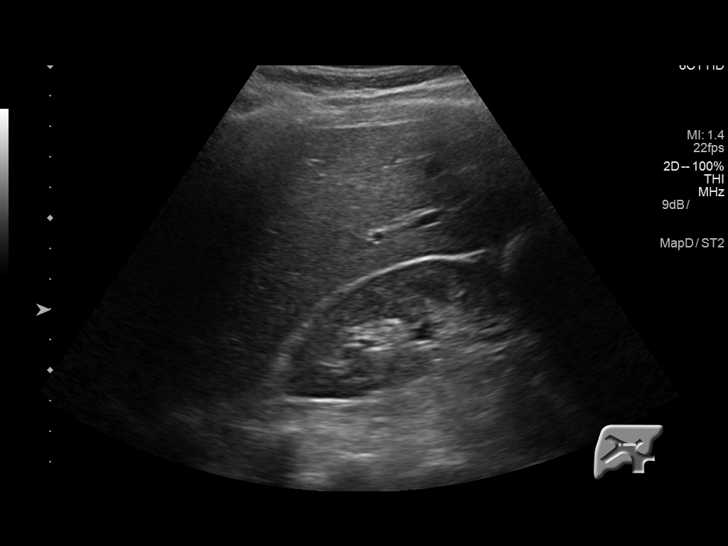
[im 37/80]
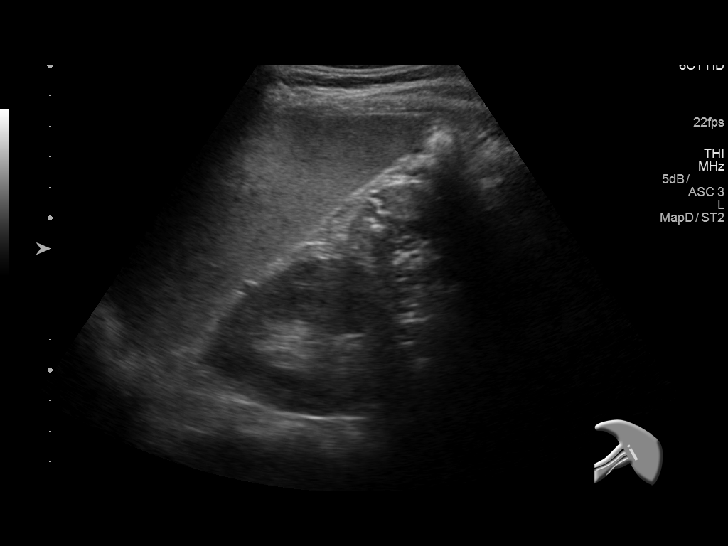
[im 43/80]
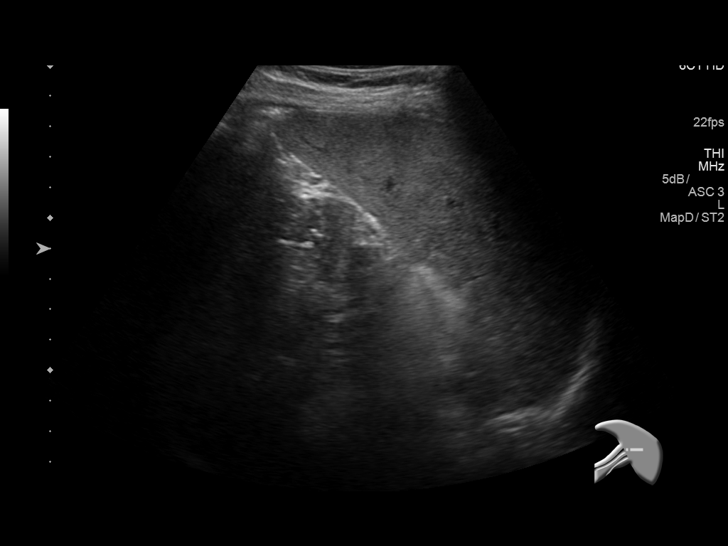
[im 50/80]
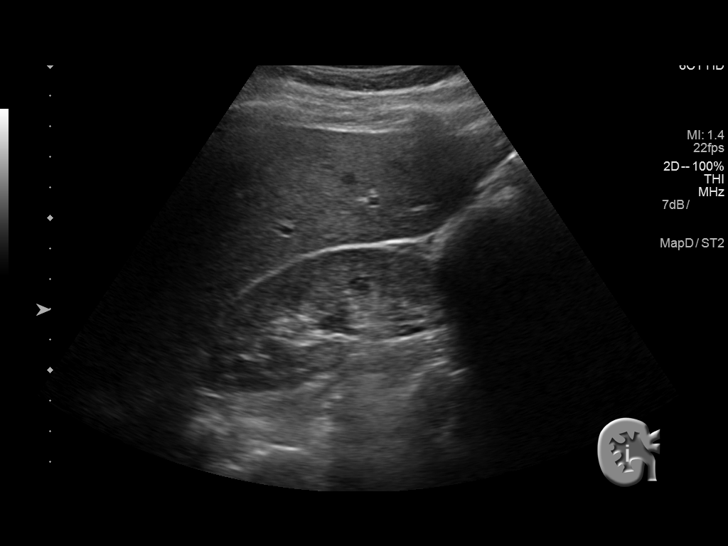
[im 53/80]
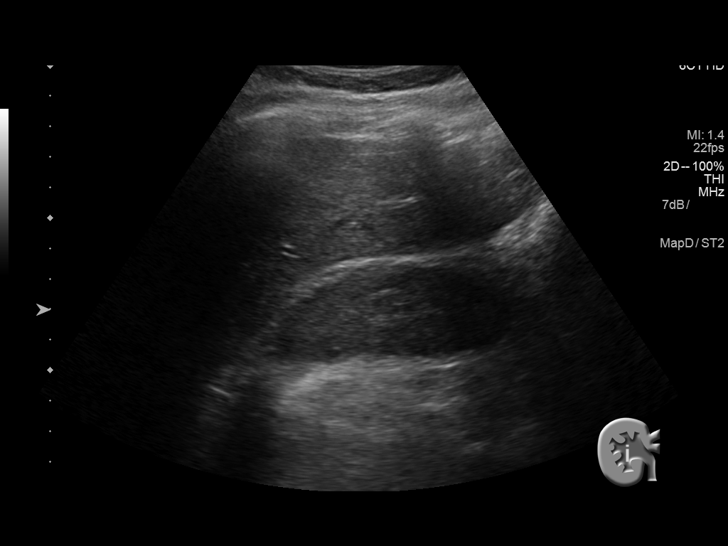
[im 60/80]
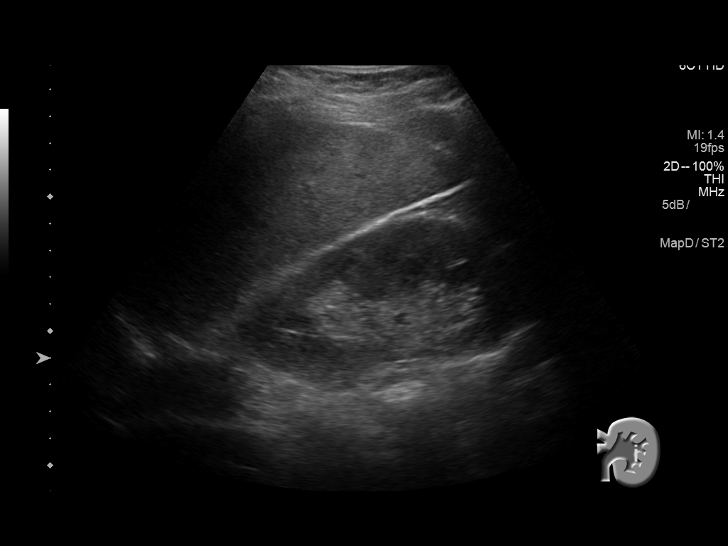
[im 66/80]
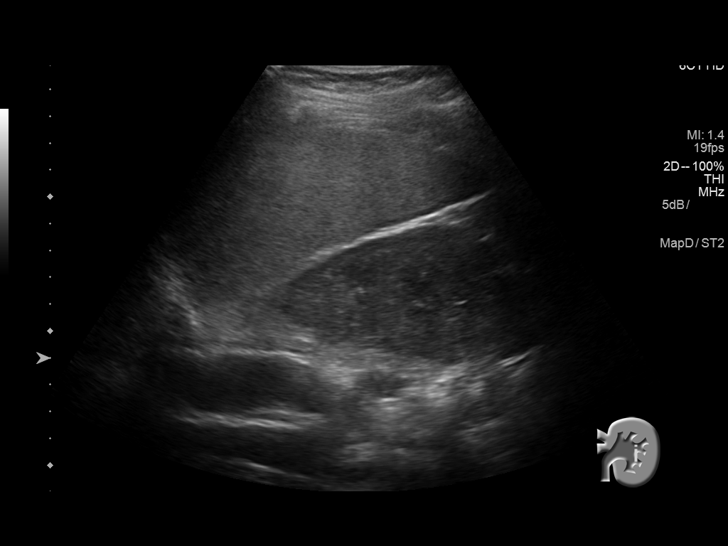
[im 73/80]
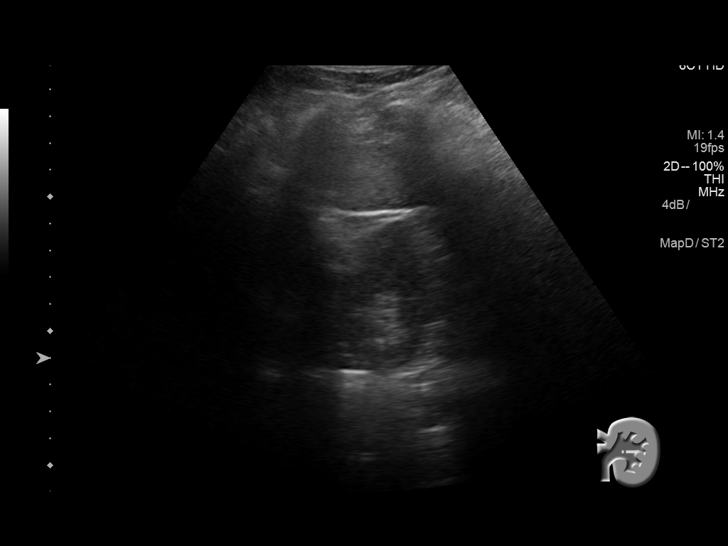
[im 80/80]
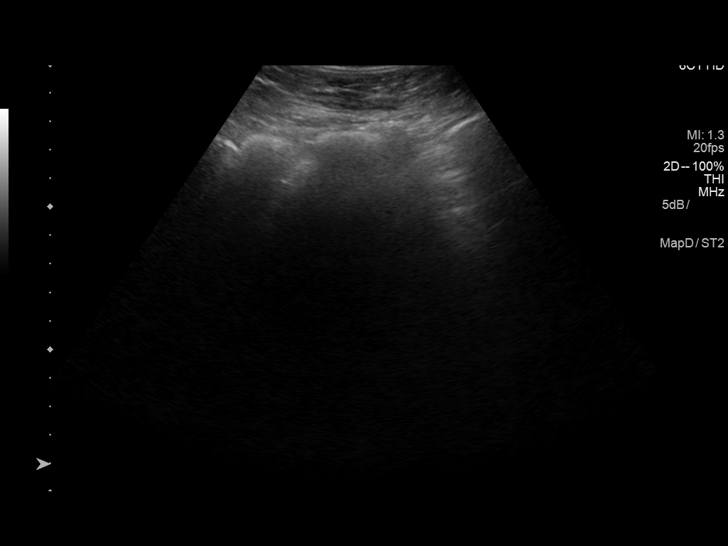

[14 of 25 positions shown; findings below may reference images not displayed]

FINDINGS: Gallbladder: Surgically absent.

Common bile duct: Diameter: 1.2 cm proximally, unchanged. A small
amount of echogenic material in the duct is less than what was
present on the prior study. The distal common duct was obscured by
bowel gas.

Liver: No focal lesion identified. Within normal limits in
parenchymal echogenicity.

IVC: No abnormality visualized.

Pancreas: Not visualized due to overlying bowel gas.

Spleen: Size and appearance within normal limits.

Right Kidney: Length: 10.8 cm. Echogenicity within normal limits. No
mass or hydronephrosis visualized.

Left Kidney: Length: 11.4 cm. Echogenicity within normal limits. No
mass or hydronephrosis visualized.

Abdominal aorta: Normal caliber proximally. Mid to distal aorta and
aortic bifurcation not visualized due to bowel gas.

Other findings: None.
IMPRESSION: 1. Prior cholecystectomy with unchanged common bile duct dilatation.
Distal duct obscured.
2. Pancreas and distal aorta obscured by bowel gas.

## 2018-11-26 ENCOUNTER — Other Ambulatory Visit: Payer: Self-pay | Admitting: Family Medicine

## 2018-12-03 ENCOUNTER — Other Ambulatory Visit: Payer: Self-pay | Admitting: Family Medicine

## 2018-12-03 NOTE — Telephone Encounter (Signed)
Name of Medication: Tramadol Name of Pharmacy: [facility] or Written Date and Quantity: 07/17/18, #40 Last Office Visit and Type: 10/03/18, pain mgmt f/u Next Office Visit and Type: 01/02/19, 3 mo pain mgmt f/u Last Controlled Substance Agreement Date: 03/14/17  Last UDS: 03/14/17

## 2018-12-04 NOTE — Telephone Encounter (Signed)
Eprescribed.

## 2018-12-10 ENCOUNTER — Other Ambulatory Visit: Payer: Self-pay | Admitting: Family Medicine

## 2018-12-10 NOTE — Telephone Encounter (Signed)
Last office visit 10/03/2018 for Chronic Pain Management.  Last refilled 09/14/2018 for #90 with no refills.  Next Appt: 12/12/2018.  UDS/Contract 08/21/018.

## 2018-12-12 ENCOUNTER — Ambulatory Visit (INDEPENDENT_AMBULATORY_CARE_PROVIDER_SITE_OTHER)
Admission: RE | Admit: 2018-12-12 | Discharge: 2018-12-12 | Disposition: A | Discharge status: Home / Self Care | Payer: Medicare Other | Source: Ambulatory Visit | Attending: Family Medicine | Admitting: Family Medicine

## 2018-12-12 ENCOUNTER — Ambulatory Visit (INDEPENDENT_AMBULATORY_CARE_PROVIDER_SITE_OTHER): Payer: Medicare Other | Admitting: Family Medicine

## 2018-12-12 ENCOUNTER — Other Ambulatory Visit: Payer: Self-pay

## 2018-12-12 ENCOUNTER — Encounter: Payer: Self-pay | Admitting: Family Medicine

## 2018-12-12 VITALS — BP 130/80 | HR 114 | Temp 98.9°F | Ht 64.5 in | Wt 125.1 lb

## 2018-12-12 DIAGNOSIS — M5410 Radiculopathy, site unspecified: Secondary | ICD-10-CM | POA: Diagnosis not present

## 2018-12-12 DIAGNOSIS — M797 Fibromyalgia: Secondary | ICD-10-CM

## 2018-12-12 DIAGNOSIS — G894 Chronic pain syndrome: Secondary | ICD-10-CM

## 2018-12-12 DIAGNOSIS — R102 Pelvic and perineal pain: Secondary | ICD-10-CM | POA: Insufficient documentation

## 2018-12-12 MED ORDER — NAPROXEN 375 MG PO TABS: 375.0000 mg | ORAL_TABLET | Freq: Two times a day (BID) | ORAL | 0 refills | Status: DC

## 2018-12-12 MED ORDER — AMPHETAMINE-DEXTROAMPHET ER 30 MG PO CP24: 30.0000 mg | ORAL_CAPSULE | ORAL | 0 refills | Status: DC

## 2018-12-12 NOTE — Assessment & Plan Note (Addendum)
After fall 3.5 wks ago - progressively worsening. Check films r/o compression or other fracture or injury. I don't see obvious compression fracture but she does have significant arthritic changes. Concern for radiculopathy given description of symptoms however largely benign nonfocal neurological exam.  Further supportive care reviewed - heating pad, add short course of NSAID. Noted increased tramadol need. Update if not improving over time.

## 2018-12-12 NOTE — Patient Instructions (Addendum)
I do think you had sciatica flare after recent fall. xrays today - we will be in touch with results.  Continue treatment as up to now including heating pad, lyrica, tramadol, Kadian, and voltaren gel and tylenol. Add short course of anti inflammatory to pain regimen, let us know if not improving with this.

## 2018-12-12 NOTE — Progress Notes (Addendum)
This visit was conducted in person.  BP 130/80 (BP Location: Left Arm, Patient Position: Sitting, Cuff Size: Normal)   Pulse (!) 114   Temp 98.9 F (37.2 C) (Oral)   Ht 5' 4.5" (1.638 m)   Wt 125 lb 2 oz (56.8 kg)   LMP 07/25/1998   SpO2 99%   BMI 21.15 kg/m    CC: fall with injury Subjective:    Patient ID: Rachel Duran, female    DOB: 02/07/1956, 63 y.o.   MRN: [id number]  HPI: Rachel Duran is a 63 y.o. female presenting on 12/12/2018 for Fall (Pt fell at home about 3.5 wks ago. C/o pain in right low back radiating into the leg. Pain is worsening. Tried Tylenol, Ultram prn and Voltaren gel. )   DOI: 3.5 wks ago [place] while playing with dog- slipped on hardwood floor with socks, landed on hardwood floor hit R>L tailbone with shooting pain into buttocks down leg to knees. Associated numbness and tingling. Symptoms are worsening. Both legs feel weak. Worse pain with prolonged sitting, worse pain with prolonged standing - spasm of groin. Didn't develop bruising but she did have small knot on R buttock that is slowly improving.   No bowel/bladder accidents. No pain midline spine.   Treating pain with tylenol, ultram, voltaren gel and regular pain regimen of Kadian 40mg  bid and lyrica 150mg  bid. Using heating pad with benefit.   She did have viral illness with fever, body aches, chills, cough, ST, chest heaviness, and loss of taste/smell - persistent loss of this. Had facial CT by Dr Redmond Baseman - largely normal. Wonders if this could have been [place].      Relevant past medical, surgical, family and social history reviewed and updated as indicated. Interim medical history since our last visit reviewed. Allergies and medications reviewed and updated. Outpatient Medications Prior to Visit  Medication Sig Dispense Refill  . acyclovir (ZOVIRAX) 800 MG tablet Take 0.5 tablets (400 mg total) by mouth daily. 45 tablet 1  . ALPRAZolam (XANAX) 1 MG tablet Take 0.5 mg by mouth at bedtime  as needed for anxiety. With 1/2 tab at night time  0  . amLODipine (NORVASC) 5 MG tablet Take 1 tablet (5 mg total) by mouth daily. Take 1 and a half tablet (7.5 mg) by mouth daily (Patient taking differently: Take 7.5 mg by mouth daily. ) 45 tablet 2  . ARIPiprazole (ABILIFY) 5 MG tablet Take 5 mg by mouth daily.     Marland Kitchen aspirin EC 81 MG tablet Take 81 mg by mouth every other day.     . calcium-vitamin D (OSCAL WITH D) 500-200 MG-UNIT tablet Take 1 tablet by mouth 2 (two) times daily.    . cyanocobalamin (,VITAMIN B-12,) 1000 MCG/ML injection INJECT 1 ML INTRAMUSCULARLY EVERY 21 DAYS 3 mL 1  . cyclobenzaprine (FEXMID) 7.5 MG tablet Take 7.5 mg by mouth 2 (two) times daily as needed (migraine).    Marland Kitchen diclofenac sodium (VOLTAREN) 1 % GEL Apply 2-4 grams to affected joint up to 4 times daily. 400 g 2  . esomeprazole (NEXIUM) 40 MG capsule TAKE 1 CAPSULE BY MOUTH TWICE DAILY BEFORE A MEAL (Patient taking differently: Take 40 mg by mouth 2 (two) times daily before a meal. ) 60 capsule 11  . Estradiol (YUVAFEM) 10 MCG TABS vaginal tablet Place 1 tablet (10 mcg total) vaginally 3 (three) times a week. 12 tablet 9  . Eszopiclone 3 MG TABS Take 1 tablet (3 mg total)  by mouth at bedtime. Take immediately before bedtime 90 tablet 0  . ferrous sulfate 325 (65 FE) MG tablet Take 325 mg by mouth every Monday, Wednesday, and Friday.     . fluticasone (FLONASE) 50 MCG/ACT nasal spray Place 2 sprays into both nostrils daily as needed (seasonal allergies). 16 g 5  . KADIAN 40 MG CP24 Take 1 capsule by mouth 2 (two) times daily. 60 capsule 0  . lamoTRIgine (LAMICTAL) 200 MG tablet Take 200 mg by mouth daily.    . magnesium hydroxide (MILK OF MAGNESIA) 400 MG/5ML suspension Take by mouth daily as needed for mild constipation.    Marland Kitchen MAGNESIUM PO Take 1,000 mg by mouth 3 (three) times daily.    . Manganese 50 MG TABS Take 50 mg by mouth daily.     . Multiple Vitamins-Minerals (PRESERVISION AREDS PO) Take 1 tablet by mouth  2 (two) times daily.    . nitroGLYCERIN (NITROGLYN) 2 % ointment Apply 1/4 inch to fingertips three times daily. 30 g 0  . NONFORMULARY OR COMPOUNDED ITEM Indomethacin Gel 10% Apply to Affect Area up to twice daily 60 each 1  . NONFORMULARY OR COMPOUNDED ITEM Testosterone propionate 2% in white petrolatum, apply small amount once daily for 5 days per week. 60 each 1  . ondansetron (ZOFRAN ODT) 4 MG disintegrating tablet Take 1 tablet (4 mg total) by mouth every 8 (eight) hours as needed for nausea or vomiting. 20 tablet 1  . OVER THE COUNTER MEDICATION Take 1 tablet by mouth daily. Ginger/Tumeric combination tablet    . pentosan polysulfate (ELMIRON) 100 MG capsule Take 100 mg by mouth 3 (three) times daily.    . phenazopyridine (PYRIDIUM) 100 MG tablet Take 1 tablet (100 mg total) by mouth 3 (three) times daily as needed for pain. 20 tablet 0  . Polyethylene Glycol 3350 (MIRALAX PO) Take 17 g by mouth 3 (three) times daily.     . predniSONE (DELTASONE) 20 MG tablet Take two tablets daily for 3 days followed by one tablet daily for 3 days 9 tablet 0  . pregabalin (LYRICA) 150 MG capsule Take 1 capsule (150 mg total) by mouth 2 (two) times daily. 180 capsule 0  . SF 5000 PLUS 1.1 % CREA dental cream Place 1 application onto teeth at bedtime.     . traMADol (ULTRAM) 50 MG tablet TAKE ONE TABLET BY MOUTH TWICE DAILY AS NEEDED 40 tablet 0  . ursodiol (ACTIGALL) 300 MG capsule Take 1 capsule (300 mg total) by mouth 2 (two) times daily. 60 capsule 11  . amphetamine-dextroamphetamine (ADDERALL XR) 30 MG 24 hr capsule TAKE 1 CAPSULE EVERY MORNING (Patient taking differently: Take 30 mg by mouth every morning. ) 90 capsule 0  . Plecanatide (TRULANCE) 3 MG TABS Take 1 tablet by mouth daily. 30 tablet 11   No facility-administered medications prior to visit.      Per HPI unless specifically indicated in ROS section below Review of Systems Objective:    BP 130/80 (BP Location: Left Arm, Patient Position:  Sitting, Cuff Size: Normal)   Pulse (!) 114   Temp 98.9 F (37.2 C) (Oral)   Ht 5' 4.5" (1.638 m)   Wt 125 lb 2 oz (56.8 kg)   LMP 07/25/1998   SpO2 99%   BMI 21.15 kg/m   Wt Readings from Last 3 Encounters:  12/12/18 125 lb 2 oz (56.8 kg)  10/03/18 121 lb 6 oz (55.1 kg)  10/01/18 123 lb 6.4 oz (56  kg)    Physical Exam Vitals signs and nursing note reviewed.  Constitutional:      Appearance: Normal appearance. She is not ill-appearing.  Musculoskeletal: Normal range of motion.        General: No swelling or tenderness.     Right lower leg: No edema.     Left lower leg: No edema.     Comments:  Discomfort midline lower lumbar spine. No pain midline sacrum R>L lower lumbar paraspinous mm tenderness + SLR on right No pain with int/ext rotation at hip. Neg FABER. Tender predominantly at bilateral sciatic notch as well as at SIJ and GTB bilaterally.   Skin:    General: Skin is warm and dry.     Capillary Refill: Capillary refill takes less than 2 seconds.     Findings: No rash.  Neurological:     General: No focal deficit present.     Mental Status: She is alert.     Cranial Nerves: Cranial nerves are intact.     Sensory: Sensation is intact.     Motor: Motor function is intact.     Coordination: Coordination is intact.     Deep Tendon Reflexes:     Reflex Scores:      Patellar reflexes are 2+ on the right side and 2+ on the left side.      Achilles reflexes are 2+ on the right side and 2+ on the left side.    Comments:  5/5 strength BLE DTRs intact bilaterally Antalgic gait  Psychiatric:        Mood and Affect: Mood normal.        Behavior: Behavior normal.       Results for orders placed or performed in visit on 09/25/18  Lipase  Result Value Ref Range   Lipase 30.0 11.0 - 59.0 U/L  Comprehensive metabolic panel  Result Value Ref Range   Sodium 136 135 - 145 mEq/L   Potassium 4.6 3.5 - 5.1 mEq/L   Chloride 100 96 - 112 mEq/L   CO2 29 19 - 32 mEq/L    Glucose, Bld 93 70 - 99 mg/dL   BUN 8 6 - 23 mg/dL   Creatinine, Ser 0.57 0.40 - 1.20 mg/dL   Total Bilirubin 0.4 0.2 - 1.2 mg/dL   Alkaline Phosphatase 184 (H) 39 - 117 U/L   AST 21 0 - 37 U/L   ALT 22 0 - 35 U/L   Total Protein 6.5 6.0 - 8.3 g/dL   Albumin 4.3 3.5 - 5.2 g/dL   Calcium 9.0 8.4 - 10.5 mg/dL   GFR 107.95 >60.00 mL/min  CBC with Differential/Platelet  Result Value Ref Range   WBC 8.0 4.0 - 10.5 K/uL   RBC 4.41 3.87 - 5.11 Mil/uL   Hemoglobin 12.7 12.0 - 15.0 g/dL   HCT 37.7 36.0 - 46.0 %   MCV 85.5 78.0 - 100.0 fl   MCHC 33.7 30.0 - 36.0 g/dL   RDW 13.5 11.5 - 15.5 %   Platelets 480.0 (H) 150.0 - 400.0 K/uL   Neutrophils Relative % 58.1 43.0 - 77.0 %   Lymphocytes Relative 33.1 12.0 - 46.0 %   Monocytes Relative 6.8 3.0 - 12.0 %   Eosinophils Relative 0.8 0.0 - 5.0 %   Basophils Relative 1.2 0.0 - 3.0 %   Neutro Abs 4.7 1.4 - 7.7 K/uL   Lymphs Abs 2.7 0.7 - 4.0 K/uL   Monocytes Absolute 0.5 0.1 - 1.0 K/uL   Eosinophils  Absolute 0.1 0.0 - 0.7 K/uL   Basophils Absolute 0.1 0.0 - 0.1 K/uL   Assessment & Plan:   Problem List Items Addressed This Visit    Fibromyalgia   Relevant Medications   naproxen (NAPROSYN) 375 MG tablet   Chronic pain syndrome   Relevant Medications   naproxen (NAPROSYN) 375 MG tablet   Back pain with right-sided radiculopathy - Primary    After fall 3.5 wks ago - progressively worsening. Check films r/o compression or other fracture or injury. I don't see obvious compression fracture but she does have significant arthritic changes. Concern for radiculopathy given description of symptoms however largely benign nonfocal neurological exam.  Further supportive care reviewed - heating pad, add short course of NSAID. Noted increased tramadol need. Update if not improving over time.       Relevant Medications   amphetamine-dextroamphetamine (ADDERALL XR) 30 MG 24 hr capsule   naproxen (NAPROSYN) 375 MG tablet   Other Relevant Orders   DG  Lumbar Spine Complete   DG Pelvis Comp Min 3V       Meds ordered this encounter  Medications  . amphetamine-dextroamphetamine (ADDERALL XR) 30 MG 24 hr capsule    Sig: Take 1 capsule (30 mg total) by mouth every morning.    Dispense:  90 capsule    Refill:  0  . naproxen (NAPROSYN) 375 MG tablet    Sig: Take 1 tablet (375 mg total) by mouth 2 (two) times daily with a meal. For 7 days then BID PRN pain    Dispense:  40 tablet    Refill:  0   Orders Placed This Encounter  Procedures  . DG Lumbar Spine Complete    Standing Status:   Future    Number of Occurrences:   1    Standing Expiration Date:   02/11/2020    Order Specific Question:   Reason for Exam (SYMPTOM  OR DIAGNOSIS REQUIRED)    Answer:   lower back pain after fall    Order Specific Question:   Is patient pregnant?    Answer:   No    Order Specific Question:   Preferred imaging location?    Answer:   [facility]    Order Specific Question:   Radiology Contrast Protocol - do NOT remove file path    Answer:   \\[email or web address].[email or web address]  . DG Pelvis Comp Min 3V    Standing Status:   Future    Number of Occurrences:   1    Standing Expiration Date:   02/11/2020    Order Specific Question:   Reason for Exam (SYMPTOM  OR DIAGNOSIS REQUIRED)    Answer:   fall 3 wks ago with residual pain    Order Specific Question:   Is patient pregnant?    Answer:   No    Order Specific Question:   Preferred imaging location?    Answer:   [facility]    Order Specific Question:   Radiology Contrast Protocol - do NOT remove file path    Answer:   \\[email or web address]   Patient Instructions  I do think you had sciatica flare after recent fall. xrays today - we will be in touch with results.  Continue treatment as up to now including heating pad, lyrica, tramadol, Kadian, and voltaren gel and tylenol. Add short course of anti inflammatory to pain  regimen, let us know if not improving with this.     Follow up plan: Return if symptoms worsen or  fail to improve.  Ria Bush, MD

## 2018-12-12 NOTE — Addendum Note (Signed)
Addended by: Ria Bush on: 12/12/2018 12:53 PM   Modules accepted: Level of Service

## 2018-12-13 ENCOUNTER — Encounter: Payer: Self-pay | Admitting: Family Medicine

## 2018-12-24 ENCOUNTER — Ambulatory Visit
Admission: RE | Admit: 2018-12-24 | Discharge: 2018-12-24 | Disposition: A | Discharge status: Home / Self Care | Payer: Medicare Other | Source: Ambulatory Visit | Attending: Obstetrics and Gynecology | Admitting: Obstetrics and Gynecology

## 2018-12-24 ENCOUNTER — Other Ambulatory Visit: Payer: Self-pay

## 2018-12-24 DIAGNOSIS — Z1231 Encounter for screening mammogram for malignant neoplasm of breast: Secondary | ICD-10-CM

## 2018-12-25 ENCOUNTER — Other Ambulatory Visit: Payer: Self-pay | Admitting: Family Medicine

## 2018-12-25 ENCOUNTER — Encounter: Payer: Self-pay | Admitting: Family Medicine

## 2018-12-25 DIAGNOSIS — M5410 Radiculopathy, site unspecified: Secondary | ICD-10-CM

## 2018-12-25 NOTE — Telephone Encounter (Signed)
Name of Medication: Kadian Name of Pharmacy: [facility] or Written Date and Quantity: 11/26/18, #60 Last Office Visit and Type: 12/12/18, f/u Next Office Visit and Type: 01/02/19, 3 mo chronic pain f/u Last Controlled Substance Agreement Date: 03/14/17 Last UDS: 03/14/17

## 2018-12-26 NOTE — Telephone Encounter (Signed)
Eprescribed.

## 2018-12-31 ENCOUNTER — Other Ambulatory Visit: Payer: Self-pay | Admitting: Family Medicine

## 2018-12-31 NOTE — Telephone Encounter (Signed)
Last office visit 12/12/2018 for back pain.  Last refilled 10/03/2018 for #90 with no refills.  Next Appt: 01/02/2019 for chronic pain.

## 2019-01-02 ENCOUNTER — Encounter: Payer: Self-pay | Admitting: Family Medicine

## 2019-01-02 ENCOUNTER — Telehealth (INDEPENDENT_AMBULATORY_CARE_PROVIDER_SITE_OTHER): Payer: Medicare Other | Admitting: Family Medicine

## 2019-01-02 VITALS — BP 122/72 | HR 88 | Temp 97.9°F | Ht 64.5 in | Wt 122.4 lb

## 2019-01-02 DIAGNOSIS — M797 Fibromyalgia: Secondary | ICD-10-CM

## 2019-01-02 DIAGNOSIS — R102 Pelvic and perineal pain: Secondary | ICD-10-CM

## 2019-01-02 DIAGNOSIS — G8929 Other chronic pain: Secondary | ICD-10-CM | POA: Diagnosis not present

## 2019-01-02 DIAGNOSIS — G894 Chronic pain syndrome: Secondary | ICD-10-CM

## 2019-01-02 DIAGNOSIS — K5903 Drug induced constipation: Secondary | ICD-10-CM

## 2019-01-02 MED ORDER — NALOXEGOL OXALATE 25 MG PO TABS: 25.0000 mg | ORAL_TABLET | Freq: Every day | ORAL | 3 refills | Status: DC

## 2019-01-02 NOTE — Assessment & Plan Note (Addendum)
Ongoing chronic severe narcotic induced constipation. linzess has lost effect but continues to use. Continues miralax, MOM, sennakot, benefiber without benefit. movantik was most effective - will re prescribe with PA.

## 2019-01-02 NOTE — Telephone Encounter (Signed)
Eprescribed.

## 2019-01-02 NOTE — Progress Notes (Signed)
Virtual visit completed through [facility]. Due to national recommendations of social distancing due to COVID-19, a virtual visit is felt to be most appropriate for this patient at this time. Reviewed limitations of a virtual visit.   Patient location: home Provider location: [place] at [facility], office If any vitals were documented, they were collected by patient at home unless specified below.    BP 122/72   Pulse 88   Temp 97.9 F (36.6 C)   Ht 5' 4.5" (1.638 m)   Wt 122 lb 6 oz (55.5 kg)   LMP 07/25/1998   BMI 20.68 kg/m    CC: 3 mo chronic pain visit Subjective:    Patient ID: Rachel Duran, female    DOB: 04-Jun-1956, 63 y.o.   MRN: [id number]  HPI: Rachel Duran is a 63 y.o. female presenting on 01/02/2019 for Follow-up (3 mo chronic pain management. )   Seen last month 5/20 after fall at home in April with worsening R>L lower back pain shooting into buttock. Xrays largely unrevealing, we did refer to ortho for further evaluation given poor response to treatment tried. Upcoming ortho appt 01/15/2019 [name]).   Ongoing bilateral buttock pain L>R through groin into hips and anterior thighs. Notes limited mobility, decreased ability to ambulate short distances, trouble getting out of car due to pain. This is a significant decrease in function from prior (was walking regularly several days a week)  Chronic pain stemming from known cervical and lumbar DDD. Embeda 80/3.2mg  no longer manufactured. MS contin did not provide any pain relief. Current pain regimen is Kadian ER 40mg  BID #60/mo and lyrica 150mg  bid. Also on tramadol #40 PR. Tolerates medication well. Also uses indomethacin cream or voltaren gel PRN.   Chronic constipation - goes days without BM. Sennakot was ineffective. Continues miralax and milk of magnesia. Linzess worked well for first 9-12 months but now ineffective. Movantik was most beneficial but too expensive.   She also takes adderall 30mg  daily  in am.      Relevant past medical, surgical, family and social history reviewed and updated as indicated. Interim medical history since our last visit reviewed. Allergies and medications reviewed and updated. Outpatient Medications Prior to Visit  Medication Sig Dispense Refill  . acyclovir (ZOVIRAX) 800 MG tablet Take 0.5 tablets (400 mg total) by mouth daily. 45 tablet 1  . ALPRAZolam (XANAX) 1 MG tablet Take 0.5 mg by mouth at bedtime as needed for anxiety. With 1/2 tab at night time  0  . amLODipine (NORVASC) 5 MG tablet Take 1 tablet (5 mg total) by mouth daily. Take 1 and a half tablet (7.5 mg) by mouth daily (Patient taking differently: Take 7.5 mg by mouth daily. ) 45 tablet 2  . amphetamine-dextroamphetamine (ADDERALL XR) 30 MG 24 hr capsule Take 1 capsule (30 mg total) by mouth every morning. 90 capsule 0  . ARIPiprazole (ABILIFY) 5 MG tablet Take 5 mg by mouth daily.     Marland Kitchen aspirin EC 81 MG tablet Take 81 mg by mouth every other day.     . calcium-vitamin D (OSCAL WITH D) 500-200 MG-UNIT tablet Take 1 tablet by mouth 2 (two) times daily.    . cyanocobalamin (,VITAMIN B-12,) 1000 MCG/ML injection INJECT 1 ML INTRAMUSCULARLY EVERY 21 DAYS 3 mL 1  . cyclobenzaprine (FEXMID) 7.5 MG tablet Take 7.5 mg by mouth 2 (two) times daily as needed (migraine).    Marland Kitchen diclofenac sodium (VOLTAREN) 1 % GEL Apply 2-4 grams to  affected joint up to 4 times daily. 400 g 2  . esomeprazole (NEXIUM) 40 MG capsule TAKE 1 CAPSULE BY MOUTH TWICE DAILY BEFORE A MEAL (Patient taking differently: Take 40 mg by mouth 2 (two) times daily before a meal. ) 60 capsule 11  . Estradiol (YUVAFEM) 10 MCG TABS vaginal tablet Place 1 tablet (10 mcg total) vaginally 3 (three) times a week. 12 tablet 9  . Eszopiclone 3 MG TABS TAKE 1 TABLET BY MOUTH AT BEDTIME TAKE IMMEDIATLEY BEFORE BEDTIME 90 tablet 0  . ferrous sulfate 325 (65 FE) MG tablet Take 325 mg by mouth every Monday, Wednesday, and Friday.     . fluticasone (FLONASE)  50 MCG/ACT nasal spray Place 2 sprays into both nostrils daily as needed (seasonal allergies). 16 g 5  . KADIAN 40 MG CP24 TAKE 1 CAPSULE BY MOUTH TWICE DAILY 60 capsule 0  . lamoTRIgine (LAMICTAL) 200 MG tablet Take 200 mg by mouth daily.    . magnesium hydroxide (MILK OF MAGNESIA) 400 MG/5ML suspension Take by mouth daily as needed for mild constipation.    Marland Kitchen MAGNESIUM PO Take 1,000 mg by mouth 3 (three) times daily.    . Manganese 50 MG TABS Take 50 mg by mouth daily.     . Multiple Vitamins-Minerals (PRESERVISION AREDS PO) Take 1 tablet by mouth 2 (two) times daily.    . naproxen (NAPROSYN) 375 MG tablet Take 1 tablet (375 mg total) by mouth 2 (two) times daily with a meal. For 7 days then BID PRN pain 40 tablet 0  . nitroGLYCERIN (NITROGLYN) 2 % ointment Apply 1/4 inch to fingertips three times daily. 30 g 0  . NONFORMULARY OR COMPOUNDED ITEM Indomethacin Gel 10% Apply to Affect Area up to twice daily 60 each 1  . NONFORMULARY OR COMPOUNDED ITEM Testosterone propionate 2% in white petrolatum, apply small amount once daily for 5 days per week. 60 each 1  . ondansetron (ZOFRAN ODT) 4 MG disintegrating tablet Take 1 tablet (4 mg total) by mouth every 8 (eight) hours as needed for nausea or vomiting. 20 tablet 1  . OVER THE COUNTER MEDICATION Take 1 tablet by mouth daily. Ginger/Tumeric combination tablet    . pentosan polysulfate (ELMIRON) 100 MG capsule Take 100 mg by mouth 3 (three) times daily.    . phenazopyridine (PYRIDIUM) 100 MG tablet Take 1 tablet (100 mg total) by mouth 3 (three) times daily as needed for pain. 20 tablet 0  . Polyethylene Glycol 3350 (MIRALAX PO) Take 17 g by mouth 3 (three) times daily.     . predniSONE (DELTASONE) 20 MG tablet Take two tablets daily for 3 days followed by one tablet daily for 3 days 9 tablet 0  . pregabalin (LYRICA) 150 MG capsule Take 1 capsule (150 mg total) by mouth 2 (two) times daily. 180 capsule 0  . SF 5000 PLUS 1.1 % CREA dental cream Place 1  application onto teeth at bedtime.     . traMADol (ULTRAM) 50 MG tablet TAKE ONE TABLET BY MOUTH TWICE DAILY AS NEEDED 40 tablet 0  . ursodiol (ACTIGALL) 300 MG capsule Take 1 capsule (300 mg total) by mouth 2 (two) times daily. 60 capsule 11   No facility-administered medications prior to visit.      Per HPI unless specifically indicated in ROS section below Review of Systems Objective:    BP 122/72   Pulse 88   Temp 97.9 F (36.6 C)   Ht 5' 4.5" (1.638 m)  Wt 122 lb 6 oz (55.5 kg)   LMP 07/25/1998   BMI 20.68 kg/m   Wt Readings from Last 3 Encounters:  01/02/19 122 lb 6 oz (55.5 kg)  12/12/18 125 lb 2 oz (56.8 kg)  10/03/18 121 lb 6 oz (55.1 kg)     Physical exam: Gen: alert, NAD, not ill appearing Pulm: speaks in complete sentences without increased work of breathing Psych: normal mood, normal thought content       Assessment & Plan:   Problem List Items Addressed This Visit    Pelvic pain    Ongoing sacral and pelvic pain into bilateral thighs L>R, after fall sustained 7 wks ago. Exam last month not consistent with hip pathology. Progressive and activity limiting. xrays last month unrevealing, concern for occult fracture. Will check pelvic MR.       Relevant Orders   MR PELVIS WO CONTRAST   Fibromyalgia   Encounter for chronic pain management - Primary    [place] CSRS reviewed. Will need UDS next visit.  Continue current pain regimen.       Drug-induced constipation    Ongoing chronic severe narcotic induced constipation. linzess has lost effect but continues to use. Continues miralax, MOM, sennakot, benefiber without benefit. movantik was most effective - will re prescribe with PA.       Chronic pain syndrome       Meds ordered this encounter  Medications  . naloxegol oxalate (MOVANTIK) 25 MG TABS tablet    Sig: Take 1 tablet (25 mg total) by mouth daily.    Dispense:  30 tablet    Refill:  3    Failed linzess and other bowel regimen   Orders Placed  This Encounter  Procedures  . MR PELVIS WO CONTRAST    Standing Status:   Future    Standing Expiration Date:   03/03/2020    Order Specific Question:   ** REASON FOR EXAM (FREE TEXT)    Answer:   ongoing pelvic pain after fall 7 wks ago, xrays negative    Order Specific Question:   What is the patient's sedation requirement?    Answer:   No Sedation    Order Specific Question:   Does the patient have a pacemaker or implanted devices?    Answer:   No    Order Specific Question:   Preferred imaging location?    Answer:   [facility] (table limit-550lbs)    Order Specific Question:   Radiology Contrast Protocol - do NOT remove file path    Answer:   \\[email or web address]    I discussed the assessment and treatment plan with the patient. The patient was provided an opportunity to ask questions and all were answered. The patient agreed with the plan and demonstrated an understanding of the instructions. The patient was advised to call back or seek an in-person evaluation if the symptoms worsen or if the condition fails to improve as anticipated.  Follow up plan: Return if symptoms worsen or fail to improve.  Ria Bush, MD

## 2019-01-02 NOTE — Assessment & Plan Note (Signed)
[place] CSRS reviewed. Will need UDS next visit.  Continue current pain regimen.

## 2019-01-02 NOTE — Assessment & Plan Note (Addendum)
Ongoing sacral and pelvic pain into bilateral thighs L>R, after fall sustained 7 wks ago. Exam last month not consistent with hip pathology. Progressive and activity limiting. xrays last month unrevealing, concern for occult fracture. Will check pelvic MR.

## 2019-01-07 ENCOUNTER — Other Ambulatory Visit: Payer: Self-pay | Admitting: Gastroenterology

## 2019-01-10 ENCOUNTER — Telehealth: Payer: Self-pay

## 2019-01-10 NOTE — Telephone Encounter (Signed)
Received faxed PA form for Movantik 25 mg tab from [facility].   Placed form in Dr. Synthia Innocent box.

## 2019-01-11 NOTE — Telephone Encounter (Signed)
Faxed PA.  Decision pending.

## 2019-01-11 NOTE — Telephone Encounter (Signed)
Filled and in Lisa's box 

## 2019-01-15 ENCOUNTER — Ambulatory Visit (INDEPENDENT_AMBULATORY_CARE_PROVIDER_SITE_OTHER): Payer: Medicare Other | Admitting: Orthopaedic Surgery

## 2019-01-15 ENCOUNTER — Other Ambulatory Visit: Payer: Self-pay

## 2019-01-15 ENCOUNTER — Encounter: Payer: Self-pay | Admitting: Orthopaedic Surgery

## 2019-01-15 DIAGNOSIS — M1612 Unilateral primary osteoarthritis, left hip: Secondary | ICD-10-CM

## 2019-01-15 DIAGNOSIS — M5136 Other intervertebral disc degeneration, lumbar region: Secondary | ICD-10-CM

## 2019-01-15 NOTE — Progress Notes (Signed)
Office Visit Note   Patient: Rachel Duran           Date of Birth: [birth year]           MRN: [id number] Visit Date: 01/15/2019              Requested by: Ria Bush, MD [address] [place],  [place] [address] PCP: Ria Bush, MD   Assessment & Plan: Visit Diagnoses:  1. Primary osteoarthritis of left hip   2. DDD (degenerative disc disease), lumbar     Plan: For both diagnostic and hopefully therapeutic purposes suggests a left hip intra-articular injection.  We are able to have this performed by Dr. Junius Roads today.  We will see her back in 2 weeks to see what type of response she had to the injection.  Questions were encouraged and answered at length by Dr. [name] and myself.  Follow-Up Instructions: Return for 2 weeks after left hip intra-articular injection.   Orders:  No orders of the defined types were placed in this encounter.  No orders of the defined types were placed in this encounter.     Procedures: No procedures performed   Clinical Data: No additional findings.   Subjective: Chief Complaint  Patient presents with  . Lower Back - Pain    HPI Rachel Duran is a 63 year old female comes in today with bilateral hip pain.  She apparently had a fall back in March when she slipped on a hardwood floor she is shuffling backwards playing with her dogs fell onto her back and buttocks region.  She has had pain radiating from both hips since that time but no numbness or tingling.  States her left hip pain is worse than her right.  At times she is unable to bear weight on her left side due to the pain.  She also knee pain but under the baby with left hip pain.  States sitting for long time is very uncomfortable and also getting up and down is very uncomfortable.  She has problems getting in and out of the car due to her left hip pain.  She does have known degenerative disc disease of lumbar spine which is severe.  She sees Dr. [name] for this and  has an upcoming appointment for back with him in a month or so.  She denies any bowel bladder dysfunction denies any saddle anesthesia.  She does have waking pain due to her hips and that wakens in 4-5 times at night.  She also has fibromyalgia and seronegative inflammatory arthritis for which she sees Dr. Estil Daft.  Plain films on canopy dated 12/12/2018 AP pelvis and lateral views of the left hip show no acute fracture.  Mild degenerative changes both hips.  Also reviewed on canopy on lumbar spine films dated 12/12/2018 which showed degenerative changes throughout lumbar spine with degenerative dextroscoliosis.  No acute findings.  MRI dated 01/14/2011 of the pelvis is available through care everywhere and was done [facility].  Actual images not available however the MRI was read as moderate left hip osteoarthritis involving femoral head and acetabulum.  No acute fracture of either hip.  Left greater trochanter small amount of fluid over the greater trochanter region.  Severe degenerative disc disease with disc space height loss at L3-4, L4-5 and L5-S1.  Also noted possible left distal gluteus medius tendon tear.  Review of Systems Please see HPI otherwise negative or noncontributory.  Objective: Vital Signs: LMP 07/25/1998   Physical Exam  Constitutional:      Appearance: She is normal weight. She is not ill-appearing or diaphoretic.  Cardiovascular:     Pulses: Normal pulses.  Pulmonary:     Effort: Pulmonary effort is normal.  Neurological:     Mental Status: She is oriented to person, place, and time.  Psychiatric:        Mood and Affect: Mood normal.        Behavior: Behavior normal.     Ortho Exam Bilateral lower extremities 5 out of 5 strength throughout.  Tight hamstrings bilaterally.  Negative straight leg raise bilaterally.  Deep tendon reflexes are 2+ at the knees and ankles and equal and symmetric.  Sensation grossly intact bilateral feet.  Tenderness over the trochanteric  region of both hips.  Strain pain with internal rotation of the left hip.  Fluid range of motion both hips.  Tenderness left lower lumbar paraspinous region. Specialty Comments:  No specialty comments available.  Imaging: No results found.   PMFS History: Patient Active Problem List   Diagnosis Date Noted  . Pelvic pain 12/12/2018  . Common bile duct (CBD) obstruction   . Elevated alkaline phosphatase level   . Venous insufficiency of left lower extremity 07/04/2018  . Dyslipidemia 09/22/2017  . DDD (degenerative disc disease), cervical 04/03/2017  . DDD (degenerative disc disease), lumbar 04/03/2017  . Onychomycosis 03/21/2017  . Dysphagia 10/18/2016  . Encounter for chronic pain management 10/18/2016  . Biliary stasis 09/26/2016  . Fever blister 08/18/2016  . Urinary urgency 07/05/2016  . HNP (herniated nucleus pulposus), lumbar 09/23/2015  . Sinus congestion 07/30/2015  . Iron deficiency 07/30/2015  . Health maintenance examination 06/15/2015  . 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 Headache 03/09/2015  . Clavicle enlargement 12/13/2014  . Prediabetes 12/13/2014  . Plantar fasciitis, bilateral 09/11/2014  . Advanced care planning/counseling discussion 06/10/2014  . Medicare annual wellness visit, subsequent 06/10/2014  . Abnormal thyroid function test 06/10/2014  . Chronic pain syndrome 06/10/2014  . CFS (chronic fatigue syndrome) 04/08/2014  . Calculus of bile duct 09/17/2013  . Postmenopausal atrophic vaginitis 10/19/2012  . 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 Cervical disc disorder with radiculopathy of cervical region 05/28/2010  . Anemia of chronic disease 03/17/2009  . CHEST PAIN UNSPECIFIED 02/28/2008  . APHTHOUS ULCERS 01/31/2008  . INSOMNIA, CHRONIC 11/09/2007  . Drug-induced constipation 10/11/2007  . Allergic rhinitis 04/12/2007  . MDD (major depressive disorder), recurrent episode, moderate ([place]) 03/05/2007  . [name] syndrome 03/05/2007  .  GERD 03/05/2007  . ROSACEA 03/05/2007  . NEURALGIA 03/05/2007  . 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 CYSTITIS, CHRONIC INTERSTITIAL 12/06/2006  . Fibromyalgia 12/06/2006   Past Medical History:  Diagnosis Date  . Abdominal pain last 4 months   and nausea also  . Allergy   . Anemia   . Anxiety   . 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 C6- C7 -hx. past fusion(bone graft used)  . Cholecystitis   . Chronic pain   . DDD (degenerative disc disease), lumbar 09/2015   dextroscoliosis with multilevel DDD and facet arthrosis most notable for R foraminal disc protrusion L4/5 producing severe R neural foraminal stenosis abutting R L4 nerve root, moderate spinal canal and mild lat recesss and R neural foraminal stenosis L3/4 (MRI)  . Depression    bipolar depression  . Disorders of porphyrin metabolism   . Felon of finger of left hand 11/10/2016  . Fibromyalgia   . GERD (gastroesophageal reflux  disease)   . Internal hemorrhoids   . Interstitial cystitis 06-06-12   hx.  . Irritable bowel syndrome   . PONV (postoperative nausea and vomiting)    now uses stomach blockers and no ponv  . 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 Raynauds disease    hx.  . Seronegative arthritis    [name]  . 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 CAD Father 58       MI, nonsmoker  . Esophageal cancer Father 50  . Stomach cancer Father   . Scleroderma Mother   . Hypertension Mother   . Esophageal cancer Paternal Grandfather   . Stomach cancer Paternal Grandfather   . Diabetes Maternal Grandmother   . Arthritis Brother   . Stroke Neg Hx   . Colon cancer Neg Hx   . Rectal cancer Neg Hx     Past Surgical History:  Procedure  Laterality Date  . ANTERIOR CERVICAL DECOMP/DISCECTOMY FUSION  2004   C5/6, C6/7  . ANTERIOR CERVICAL DECOMP/DISCECTOMY FUSION  02/2016   C3/4, C4/5 with plating [name])  . AUGMENTATION MAMMAPLASTY Bilateral 03/25/2010  . BREAST ENHANCEMENT SURGERY  2010  . BREAST IMPLANT EXCHANGE  10/2014   exchange saline implants, B mastopexy/capsulorraphy ([facility])  . BUNIONECTOMY Bilateral yrs ago  . [place]   x 1  . CHOLECYSTECTOMY  06/11/2012   Procedure: LAPAROSCOPIC CHOLECYSTECTOMY WITH INTRAOPERATIVE CHOLANGIOGRAM;  Surgeon: [name], MD,FACS;  Location: [facility] ORS;  Service: General;  Laterality: N/A;  . COLONOSCOPY  02/2018   done for positive cologuard - WNL, rpt 10 yrs [facility] Plan)  . CYSTOSCOPY    . ERCP  05/22/2012   Procedure: ENDOSCOPIC RETROGRADE CHOLANGIOPANCREATOGRAPHY (ERCP);  Surgeon: [name], MD,FACG;  Location: [facility];  Service: Endoscopy;  Laterality: N/A;  . ERCP N/A 09/17/2013   Procedure: ENDOSCOPIC RETROGRADE CHOLANGIOPANCREATOGRAPHY (ERCP);  Surgeon: [name], MD;  Location: [facility];  Service: Endoscopy;  Laterality: N/A;  . ERCP N/A 09/27/2018   Procedure: ENDOSCOPIC RETROGRADE CHOLANGIOPANCREATOGRAPHY (ERCP);  Surgeon: [name], MD;  Location: [facility];  Service: Endoscopy;  Laterality: N/A;  . ESOPHAGOGASTRODUODENOSCOPY  09/2016   WNL. esophagus dilated [facility] Plan)  . HEMORRHOID BANDING  09-23-13   --Dr. Greer Pickerel  . HERNIA REPAIR     inguinal  . HYSTEROSCOPY W/ ENDOMETRIAL ABLATION    . NASAL SINUS SURGERY     x5  . REMOVAL OF STONES  09/27/2018   Procedure: REMOVAL OF STONES;  Surgeon: [name], MD;  Location: [facility] ENDOS[facility];  Service: Endoscopy;;  . [name]  09/27/2018   Procedure: SPHINCTEROTOMY;  Surgeon: [name], MD;  Location: [facility] ENDOS[facility];  Service: Endoscopy;;  . UPPER GASTROINTESTINAL ENDOSCOPY     Social History   Occupational History  . Occupation: retired    Fish farm manager:  UNEMPLOYED  Tobacco Use  . Smoking status: Never Smoker  . Smokeless tobacco: Never Used  Substance and Sexual Activity  . Alcohol use: No    Alcohol/week: 0.0 standard drinks  . Drug use: No  . Sexual activity: Yes    Partners: Male    Birth control/protection: Post-menopausal    Comment: vasectomy

## 2019-01-15 NOTE — Progress Notes (Signed)
Subjective: Patient is here for ultrasound-guided intra-articular left hip injection.   Pain in posterior, lateral and anterior hip.  Objective:  Very painful passive IR.  Limited ROM due to pain.  Procedure: Ultrasound-guided left hip injection: After sterile prep with Betadine, injected 8 cc 1% lidocaine without epinephrine and 40 mg methylprednisolone using a 22-gauge spinal needle, passing the needle through the iliofemoral ligament into the femoral head/neck junction.  Injectate seen filling capsule.  Moderate effusion present at time of injection.  Not a lot of immediate relief.  Follow up with Dr. [name] as directed.

## 2019-01-18 ENCOUNTER — Telehealth: Payer: Self-pay | Admitting: Family Medicine

## 2019-01-18 NOTE — Telephone Encounter (Signed)
plz notify pt - pelvis MRI returned showing moderate L hip arthritis with bony bruises of L hip joint bones without fracture. Also showed severe lower back disc arthritis and possible partial tear of distal gluteus medius tendon. How is she feeling after hip steroid injection this week? If ongoing pain would offer physical therapy or further evaluation by ortho. Were they able to see MRI?

## 2019-01-18 NOTE — Telephone Encounter (Signed)
Spoke with pt relaying results per Dr. Darnell Level.  Pt verbalized understanding. States she is still in pain, especially when first getting going.  Has ortho f/u 01/28/19 and told may discuss hip replacement if inj does not help.

## 2019-01-19 ENCOUNTER — Other Ambulatory Visit: Payer: Self-pay | Admitting: Family Medicine

## 2019-01-21 ENCOUNTER — Other Ambulatory Visit: Payer: Self-pay | Admitting: Family Medicine

## 2019-01-21 ENCOUNTER — Telehealth: Payer: Self-pay | Admitting: Physician Assistant

## 2019-01-21 NOTE — Telephone Encounter (Signed)
Patient called requesting copy of the MRI report from 2012 in [place]. I mailed to her per her request

## 2019-01-22 NOTE — Telephone Encounter (Signed)
Name of Medication: Kadian Name of Pharmacy: [facility] or Written Date and Quantity: 12/26/18, #60 Last Office Visit and Type: 12/12/18, f/u Next Office Visit and Type: 04/19/19, chronic pain f/u Last Controlled Substance Agreement Date: 03/14/17 Last UDS: 03/14/17

## 2019-01-23 ENCOUNTER — Encounter: Payer: Self-pay | Admitting: Family Medicine

## 2019-01-23 ENCOUNTER — Telehealth: Payer: Self-pay

## 2019-01-23 NOTE — Telephone Encounter (Addendum)
Received faxed PA form from [facility] for Movantik 25 mg tab.  Placed form in Dr. Synthia Innocent box.

## 2019-01-24 NOTE — Telephone Encounter (Signed)
Received faxed PA approval through 07/23/19.

## 2019-01-24 NOTE — Telephone Encounter (Signed)
Completed and in Lisa's box.

## 2019-01-24 NOTE — Telephone Encounter (Signed)
Eprescribed.

## 2019-01-24 NOTE — Telephone Encounter (Signed)
Faxed form.  Decision pending.  

## 2019-01-28 ENCOUNTER — Ambulatory Visit (INDEPENDENT_AMBULATORY_CARE_PROVIDER_SITE_OTHER): Payer: Medicare Other | Admitting: Orthopaedic Surgery

## 2019-01-28 ENCOUNTER — Encounter: Payer: Self-pay | Admitting: Orthopaedic Surgery

## 2019-01-28 ENCOUNTER — Other Ambulatory Visit: Payer: Self-pay

## 2019-01-28 DIAGNOSIS — M1612 Unilateral primary osteoarthritis, left hip: Secondary | ICD-10-CM | POA: Insufficient documentation

## 2019-01-28 NOTE — Progress Notes (Signed)
The patient returns today for follow-up after having an intra-articular injection with a steroid into her left hip.  She has an MRI that was done through [facility] of her pelvis and left hip showing moderate arthritic changes in the femoral head and acetabulum on the left side.  There was a small amount of fluid of the trochanteric area.  She also has severe arthritic changes in her lumbar spine.  She sees Dr. [name] for this.  She does state that the steroid injection took about a week taking again and it helped her just a little bit but did not give her as much relief that she would like.  She does have significant fibromyalgia and is on chronic extended release morphine.  She is a very thin individual and active at 63 years old.  On exam she has fluid range of motion of her right hip with only mild pain.  Examination of her left hip shows pain on extremes of rotation and is in the groin.  There is stiffness to rotation of her left hip as well.  At this point she would like to consider hip replacement surgery.  We had a long and thorough discussion about hip replacement surgery.  I showed her hip replacement model as well as implants.  I gave her handout about hip replacement surgery.  We talked about her interoperative and postoperative course.  We talked about the risk and benefits of surgery as well.  All question concerns were answered and addressed.  She has her surgery scheduler's card.  If at some point she would like to have this scheduled she will give [place] a call.  I do feel that at this point it is warranted given the failure of conservative treatment for over a year now pain given the findings of her left hip on clinical exam and MRI.

## 2019-01-29 ENCOUNTER — Other Ambulatory Visit: Payer: Self-pay

## 2019-01-30 ENCOUNTER — Encounter: Payer: Self-pay | Admitting: Orthopaedic Surgery

## 2019-01-31 ENCOUNTER — Other Ambulatory Visit (HOSPITAL_COMMUNITY)
Admission: RE | Admit: 2019-01-31 | Discharge: 2019-01-31 | Disposition: A | Discharge status: Home / Self Care | Payer: Medicare Other | Source: Ambulatory Visit | Attending: Obstetrics and Gynecology | Admitting: Obstetrics and Gynecology

## 2019-01-31 ENCOUNTER — Encounter: Payer: Self-pay | Admitting: Obstetrics and Gynecology

## 2019-01-31 ENCOUNTER — Other Ambulatory Visit: Payer: Self-pay

## 2019-01-31 ENCOUNTER — Ambulatory Visit (INDEPENDENT_AMBULATORY_CARE_PROVIDER_SITE_OTHER): Payer: Medicare Other | Admitting: Obstetrics and Gynecology

## 2019-01-31 VITALS — BP 132/68 | HR 68 | Temp 97.4°F | Resp 12 | Ht 64.0 in | Wt 122.0 lb

## 2019-01-31 DIAGNOSIS — Z01419 Encounter for gynecological examination (general) (routine) without abnormal findings: Secondary | ICD-10-CM | POA: Diagnosis present

## 2019-01-31 DIAGNOSIS — R6882 Decreased libido: Secondary | ICD-10-CM | POA: Diagnosis not present

## 2019-01-31 DIAGNOSIS — Z780 Asymptomatic menopausal state: Secondary | ICD-10-CM

## 2019-01-31 MED ORDER — ESTRADIOL 10 MCG VA TABS: 1.0000 | ORAL_TABLET | VAGINAL | 10 refills | Status: DC

## 2019-01-31 MED ORDER — NONFORMULARY OR COMPOUNDED ITEM: 2 refills | Status: DC

## 2019-01-31 NOTE — Patient Instructions (Signed)

## 2019-01-31 NOTE — Progress Notes (Signed)
63 y.o. [id number] [name] here for annual exam.    Still with vaginal dryness.  Using vagifem three times a week.  Using testosterone also.  She wants to continue.   Denies vaginal bleeding or spotting.   States health problems for the last 6 months.  She went for several weeks with no pain medication.  She had a bile duct stone removed.  She tore a tendon in her gluteus with a fall.  She is facing a left hip replacement in the future.   Does have a therapist in [place].  PCP: Ria Bush, MD    Patient's last menstrual period was 07/25/1998.           Sexually active: Yes.    The current method of family planning is post menopausal status.    Exercising: No.  Exercise is limited by recent fall. Smoker:  no  Health Maintenance: Pap:  01-13-17 Neg History of abnormal Pap:  no MMG:  12/24/18 BIRADS 1 negative/density c Colonoscopy:  02/22/18 f/u 10 years BMD:   02/07/17  Result  Osteopenia of right hip.  FRAX model  - low fracture risk. TDaP:  11/10/16 HIV: negative in the past Hep C: 07/16/15 Negative Screening Labs: PCP   reports that she has never smoked. She has never used smokeless tobacco. She reports that she does not drink alcohol or use drugs.  Past Medical History:  Diagnosis Date  . Abdominal pain last 4 months   and nausea also  . Allergy   . Anemia   . Anxiety   . 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 C6- C7 -hx. past fusion(bone graft used)  . Cholecystitis   . Chronic pain   . DDD (degenerative disc disease), lumbar 09/2015   dextroscoliosis with multilevel DDD and facet arthrosis most notable for R foraminal disc protrusion L4/5 producing severe R neural foraminal stenosis abutting R L4 nerve root, moderate spinal canal and mild lat recesss and R neural foraminal stenosis L3/4 (MRI)  . Depression    bipolar depression  . Disorders of porphyrin metabolism   . Felon of finger of left hand 11/10/2016  . Fibromyalgia    . GERD (gastroesophageal reflux disease)   . Internal hemorrhoids   . Interstitial cystitis 06-06-12   hx.  . Irritable bowel syndrome   . PONV (postoperative nausea and vomiting)    now uses stomach blockers and no ponv  . 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 Raynauds disease    hx.  . Seronegative arthritis    [name]  . 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 ANTERIOR CERVICAL DECOMP/DISCECTOMY FUSION  2004   C5/6, C6/7  . ANTERIOR CERVICAL DECOMP/DISCECTOMY FUSION  02/2016   C3/4, C4/5 with plating [name])  . AUGMENTATION MAMMAPLASTY Bilateral 03/25/2010  . BREAST ENHANCEMENT SURGERY  2010  . BREAST IMPLANT EXCHANGE  10/2014   exchange saline implants, B mastopexy/capsulorraphy ([facility])  . BUNIONECTOMY Bilateral yrs ago  . [place]   x 1  . CHOLECYSTECTOMY  06/11/2012   Procedure: LAPAROSCOPIC CHOLECYSTECTOMY WITH INTRAOPERATIVE CHOLANGIOGRAM;  Surgeon: [name], MD,FACS;  Location: [facility] OR[facility];  Service: General;  Laterality: N/A;  .  COLONOSCOPY  02/2018   done for positive cologuard - WNL, rpt 10 yrs [facility] Plan)  . CYSTOSCOPY    . ERCP  05/22/2012   Procedure: ENDOSCOPIC RETROGRADE CHOLANGIOPANCREATOGRAPHY (ERCP);  Surgeon: [name], MD,FACG;  Location: [facility];  Service: Endoscopy;  Laterality: N/A;  . ERCP N/A 09/17/2013   Procedure: ENDOSCOPIC RETROGRADE CHOLANGIOPANCREATOGRAPHY (ERCP);  Surgeon: [name], MD;  Location: [facility];  Service: Endoscopy;  Laterality: N/A;  . ERCP N/A 09/27/2018   Procedure: ENDOSCOPIC RETROGRADE CHOLANGIOPANCREATOGRAPHY (ERCP);  Surgeon: [name], MD;  Location: [facility];  Service: Endoscopy;  Laterality: N/A;  .  ESOPHAGOGASTRODUODENOSCOPY  09/2016   WNL. esophagus dilated [facility])  . HEMORRHOID BANDING  09-23-13   --Dr. Greer Pickerel  . HERNIA REPAIR     inguinal  . HYSTEROSCOPY W/ ENDOMETRIAL ABLATION    . NASAL SINUS SURGERY     x5  . REMOVAL OF STONES  09/27/2018   Procedure: REMOVAL OF STONES;  Surgeon: [name], MD;  Location: [facility];  Service: Endoscopy;;  . [name]  09/27/2018   Procedure: SPHINCTEROTOMY;  Surgeon: [name], MD;  Location: [facility] END[facility];  Service: Endoscopy;;  . UPPER GASTROINTESTINAL ENDOSCOPY      Current Outpatient Medications  Medication Sig Dispense Refill  . acyclovir (ZOVIRAX) 800 MG tablet Take 0.5 tablets (400 mg total) by mouth daily. 45 tablet 1  . ALPRAZolam (XANAX) 1 MG tablet Take 0.5 mg by mouth at bedtime as needed for anxiety. With 1/2 tab at night time  0  . amLODipine (NORVASC) 5 MG tablet Take 1 tablet (5 mg total) by mouth daily. Take 1 and a half tablet (7.5 mg) by mouth daily (Patient taking differently: Take 7.5 mg by mouth daily. ) 45 tablet 2  . amphetamine-dextroamphetamine (ADDERALL XR) 30 MG 24 hr capsule Take 1 capsule (30 mg total) by mouth every morning. 90 capsule 0  . ARIPiprazole (ABILIFY) 5 MG tablet Take 5 mg by mouth daily.     Marland Kitchen aspirin EC 81 MG tablet Take 81 mg by mouth every other day.     . calcium-vitamin D (OSCAL WITH D) 500-200 MG-UNIT tablet Take 1 tablet by mouth 2 (two) times daily.    . cyanocobalamin (,VITAMIN B-12,) 1000 MCG/ML injection INJECT 1 ML INTRAMUSCULARLY EVERY 21 DAYS 3 mL 1  . cyclobenzaprine (FEXMID) 7.5 MG tablet Take 7.5 mg by mouth 2 (two) times daily as needed (migraine).    Marland Kitchen diclofenac sodium (VOLTAREN) 1 % GEL Apply 2-4 grams to affected joint up to 4 times daily. 400 g 2  . esomeprazole (NEXIUM) 40 MG capsule TAKE 1 CAPSULE BY MOUTH TWICE DAILY BEFORE A MEAL 60 capsule 9  . Estradiol (YUVAFEM) 10 MCG TABS vaginal tablet Place 1 tablet (10 mcg total) vaginally 3 (three) times a  week. 12 tablet 9  . Eszopiclone 3 MG TABS TAKE 1 TABLET BY MOUTH AT BEDTIME TAKE IMMEDIATLEY BEFORE BEDTIME 90 tablet 0  . ferrous sulfate 325 (65 FE) MG tablet Take 325 mg by mouth every Monday, Wednesday, and Friday.     . fluticasone (FLONASE) 50 MCG/ACT nasal spray Place 2 sprays into both nostrils daily as needed (seasonal allergies). 16 g 5  . KADIAN 40 MG CP24 TAKE 1 CAPSULE BY MOUTH TWICE DAILY 60 capsule 0  . lamoTRIgine (LAMICTAL) 200 MG tablet Take 200 mg by mouth daily.    . magnesium hydroxide (MILK OF MAGNESIA) 400 MG/5ML suspension Take by mouth daily as needed  for mild constipation.    Marland Kitchen MAGNESIUM PO Take 1,000 mg by mouth 3 (three) times daily.    . Manganese 50 MG TABS Take 50 mg by mouth daily.     . Multiple Vitamins-Minerals (PRESERVISION AREDS PO) Take 1 tablet by mouth 2 (two) times daily.    . naloxegol oxalate (MOVANTIK) 25 MG TABS tablet Take 1 tablet (25 mg total) by mouth daily. 30 tablet 3  . naproxen (NAPROSYN) 375 MG tablet Take 1 tablet (375 mg total) by mouth 2 (two) times daily with a meal. For 7 days then BID PRN pain 40 tablet 0  . nitroGLYCERIN (NITROGLYN) 2 % ointment Apply 1/4 inch to fingertips three times daily. 30 g 0  . NONFORMULARY OR COMPOUNDED ITEM Indomethacin Gel 10% Apply to Affect Area up to twice daily 60 each 1  . NONFORMULARY OR COMPOUNDED ITEM Testosterone propionate 2% in white petrolatum, apply small amount once daily for 5 days per week. 60 each 1  . ondansetron (ZOFRAN ODT) 4 MG disintegrating tablet Take 1 tablet (4 mg total) by mouth every 8 (eight) hours as needed for nausea or vomiting. 20 tablet 1  . ondansetron (ZOFRAN) 4 MG tablet TAKE ONE TABLET EVERY 8 HOURS AS NEEDED FOR NAUSEA OR VOMITING 30 tablet 0  . OVER THE COUNTER MEDICATION Take 1 tablet by mouth daily. Ginger/Tumeric combination tablet    . pentosan polysulfate (ELMIRON) 100 MG capsule Take 100 mg by mouth 3 (three) times daily.    . phenazopyridine (PYRIDIUM) 100 MG  tablet Take 1 tablet (100 mg total) by mouth 3 (three) times daily as needed for pain. 20 tablet 0  . Polyethylene Glycol 3350 (MIRALAX PO) Take 17 g by mouth 3 (three) times daily.     . predniSONE (DELTASONE) 20 MG tablet Take two tablets daily for 3 days followed by one tablet daily for 3 days 9 tablet 0  . pregabalin (LYRICA) 150 MG capsule Take 1 capsule (150 mg total) by mouth 2 (two) times daily. 180 capsule 0  . SF 5000 PLUS 1.1 % CREA dental cream Place 1 application onto teeth at bedtime.     . traMADol (ULTRAM) 50 MG tablet TAKE ONE TABLET BY MOUTH TWICE DAILY AS NEEDED 40 tablet 0  . ursodiol (ACTIGALL) 300 MG capsule Take 1 capsule (300 mg total) by mouth 2 (two) times daily. 60 capsule 11   No current facility-administered medications for this visit.     Family History  Problem Relation Age of Onset  . CAD Father 14       MI, nonsmoker  . Esophageal cancer Father 49  . Stomach cancer Father   . Scleroderma Mother   . Hypertension Mother   . Esophageal cancer Paternal Grandfather   . Stomach cancer Paternal Grandfather   . Diabetes Maternal Grandmother   . Arthritis Brother   . Stroke Neg Hx   . Colon cancer Neg Hx   . Rectal cancer Neg Hx     Review of Systems  Constitutional: Negative.   HENT: Negative.   Eyes: Negative.   Respiratory: Negative.   Cardiovascular: Negative.   Gastrointestinal: Negative.   Endocrine: Negative.   Genitourinary: Negative.   Musculoskeletal: Negative.   Skin: Negative.   Allergic/Immunologic: Negative.   Neurological: Negative.   Hematological: Negative.   Psychiatric/Behavioral: Negative.     Exam:   BP 132/68 (BP Location: Left Arm, Patient Position: Sitting, Cuff Size: Normal)   Pulse 68   Temp Marland Kitchen)  97.4 F (36.3 C) (Temporal)   Resp 12   Ht 5\' 4"  (1.626 m)   Wt 122 lb (55.3 kg)   LMP 07/25/1998   BMI 20.94 kg/m     General appearance: alert, cooperative and appears stated age Head: normocephalic, without obvious  abnormality, atraumatic Neck: no adenopathy, supple, symmetrical, trachea midline and thyroid normal to inspection and palpation Lungs:  Bilateral implants, and scars from surgery, no masses or tenderness, No nipple retraction or dimpling, No nipple discharge or bleeding, No axillary adenopathy Heart: regular rate and rhythm Abdomen: soft, non-tender; no masses, no organomegaly Extremities: extremities normal, atraumatic, no cyanosis or edema Skin: skin color, texture, turgor normal. No rashes or lesions Lymph nodes: cervical, supraclavicular, and axillary nodes normal. Neurologic: grossly normal  Pelvic: External genitalia:  no lesions              No abnormal inguinal nodes palpated.              Urethra:  normal appearing urethra with no masses, tenderness or lesions              Bartholins and Skenes: normal                 Vagina: normal appearing vagina with normal color and discharge, no lesions              Cervix: no lesions              Pap taken: Yes.   Bimanual Exam:  Uterus:  normal size, contour, position, consistency, mobility, non-tender              Adnexa: no mass, fullness, tenderness              Rectal exam: Yes.  .  Confirms.              Anus:  normal sphincter tone, no lesions  Chaperone was present for exam.  Assessment:   Well woman visit with normal exam. Bilateral breast surgery.  Vaginal atrophy.  Decreased libido.  Osteopenia.  Stargardt's disease. Progressive loss of vision.   Plan: Mammogram screening discussed. Self breast awareness reviewed. Pap and HR HPV as above. Guidelines for Calcium, Vitamin D, regular exercise program including cardiovascular and weight bearing exercise. Vagifem and Testosterone refill.  Will check testosterone levels.  BMD this year.  Follow up annually and prn.   After visit summary provided.

## 2019-02-01 ENCOUNTER — Other Ambulatory Visit: Payer: Self-pay | Admitting: Obstetrics and Gynecology

## 2019-02-01 DIAGNOSIS — M8580 Other specified disorders of bone density and structure, unspecified site: Secondary | ICD-10-CM

## 2019-02-04 LAB — CYTOLOGY - PAP: Diagnosis: NEGATIVE

## 2019-02-04 LAB — TESTOSTERONE, FREE, DIRECT
Testosterone, Free: <0.2 pg/mL (ref 0.0–4.2)
Testosterone, Total, LC/MS: 12.4 ng/dL (ref 7.0–40.0)

## 2019-02-05 ENCOUNTER — Encounter: Payer: Self-pay | Admitting: Orthopaedic Surgery

## 2019-02-15 ENCOUNTER — Encounter: Payer: Self-pay | Admitting: Orthopaedic Surgery

## 2019-02-20 ENCOUNTER — Other Ambulatory Visit: Payer: Self-pay | Admitting: Gastroenterology

## 2019-02-20 ENCOUNTER — Other Ambulatory Visit: Payer: Self-pay | Admitting: Family Medicine

## 2019-02-20 ENCOUNTER — Encounter: Payer: Self-pay | Admitting: Family Medicine

## 2019-02-20 NOTE — Telephone Encounter (Signed)
Name of Medication: Kadian Name of Pharmacy: [facility] or Written Date and Quantity: 01/24/19, #60 Last Office Visit and Type: 01/02/19, pain f/u Next Office Visit and Type: 9*/25/20, 3 mo pain f/u Last Controlled Substance Agreement Date: 03/14/17 Last UDS: 03/14/17

## 2019-02-20 NOTE — Telephone Encounter (Signed)
In office or virtual appointment Dr Darnell Level?

## 2019-02-21 NOTE — Telephone Encounter (Signed)
Eprescribed.

## 2019-02-22 NOTE — Telephone Encounter (Signed)
Spoke with pt scheduling MyChart visit on 02/28/19 at 11:00.

## 2019-02-22 NOTE — Telephone Encounter (Signed)
plz schedule virtual visit for patient.

## 2019-02-25 ENCOUNTER — Other Ambulatory Visit: Payer: Self-pay | Admitting: Physician Assistant

## 2019-02-25 ENCOUNTER — Telehealth: Payer: Self-pay | Admitting: Gastroenterology

## 2019-02-25 ENCOUNTER — Other Ambulatory Visit: Payer: Self-pay

## 2019-02-25 MED ORDER — URSODIOL 300 MG PO CAPS: 300.0000 mg | ORAL_CAPSULE | Freq: Two times a day (BID) | ORAL | 5 refills | Status: DC

## 2019-02-25 NOTE — Telephone Encounter (Signed)
Patient states she just wanted to make sure if she wasn't having issues that she didn't need to come in until her yearly f/u in March. Informed patient she can follow up in March and I will send enough refills until then. Patient verbalized understanding.

## 2019-02-25 NOTE — Telephone Encounter (Signed)
Last Visit: 10/01/2018 Next Visit: 04/30/2019 Labs: 09/25/2018   Last fill: 07/03/2018   Okay to refill lyrica?

## 2019-02-25 NOTE — Telephone Encounter (Signed)
Patient would like know if she needs to have office visit to get a refill for ursodiol (ACTIGALL) 300 MG capsule. Her last visit was in 09/2018 and had a procedure on 09/27/2018

## 2019-02-28 ENCOUNTER — Encounter: Payer: Self-pay | Admitting: Family Medicine

## 2019-02-28 ENCOUNTER — Telehealth (INDEPENDENT_AMBULATORY_CARE_PROVIDER_SITE_OTHER): Payer: Medicare Other | Admitting: Family Medicine

## 2019-02-28 VITALS — BP 114/76 | HR 72 | Temp 96.8°F | Ht 64.5 in | Wt 123.0 lb

## 2019-02-28 DIAGNOSIS — M5136 Other intervertebral disc degeneration, lumbar region: Secondary | ICD-10-CM

## 2019-02-28 DIAGNOSIS — G8929 Other chronic pain: Secondary | ICD-10-CM | POA: Diagnosis not present

## 2019-02-28 DIAGNOSIS — K5903 Drug induced constipation: Secondary | ICD-10-CM

## 2019-02-28 DIAGNOSIS — G894 Chronic pain syndrome: Secondary | ICD-10-CM

## 2019-02-28 DIAGNOSIS — E611 Iron deficiency: Secondary | ICD-10-CM

## 2019-02-28 DIAGNOSIS — M1612 Unilateral primary osteoarthritis, left hip: Secondary | ICD-10-CM

## 2019-02-28 MED ORDER — NALOXEGOL OXALATE 12.5 MG PO TABS: 12.5000 mg | ORAL_TABLET | Freq: Every day | ORAL | 3 refills | Status: DC

## 2019-02-28 NOTE — Progress Notes (Signed)
Virtual visit completed through [place]. Due to national recommendations of social distancing due to COVID-19, a virtual visit is felt to be most appropriate for this patient at this time. Reviewed limitations of a virtual visit.   Patient location: home Provider location: [place] [facility], office If any vitals were documented, they were collected by patient at home unless specified below.    BP 114/76   Pulse 72   Temp (!) 96.8 F (36 C)   Ht 5' 4.5" (1.638 m)   Wt 123 lb (55.8 kg)   LMP 07/25/1998   BMI 20.79 kg/m    CC: discuss meds prior to upcoming surgery Subjective:    Patient ID: Rachel Duran, female    DOB: 1955-07-28, 63 y.o.   MRN: [id number]  HPI: Rachel Duran is a 63 y.o. female presenting on 02/28/2019 for Discuss Medication (Wants to discuss ASA, Movantik and iron.  Also, wants to discuss upcoming surgery. )   Recently saw ortho [name]) for end stage L hip arthritis planned hip replacement 03/12/2019. Steroid injection with poor response. Also saw neurosurgery [name]) for progressive OA of back. Considering lumbar surgery. Planning to have post-op narcotic through ortho.   She stopped aspirin due to easy bruising.  GI stopped iron due to constipation.   Chronic pain stemming from known cervical and lumbar DDD. Embeda 80/3.2mg  no longer manufactured. MS contin did not provide any pain relief. Current pain regimen is Kadian ER 40mg  BID #60/mo. Also on tramadol #40 PRN. Tolerates medication well. Also uses indomethacin cream or voltaren gel PRN.   Worsening constipation - linzess seems to have lost effect. movantik very effective (samples) but unaffordable and insurance would initially not cover. Trulance did not help (through GI). Has also been using miralax more regularly, continues MOM and benefiber. Movantik PA was filled out, covered however she felt regular use led to loss of effect of her chronic narcotic therapy. Asks about alternatives.       Relevant past medical, surgical, family and social history reviewed and updated as indicated. Interim medical history since our last visit reviewed. Allergies and medications reviewed and updated. Outpatient Medications Prior to Visit  Medication Sig Dispense Refill  . ALPRAZolam (XANAX) 1 MG tablet Take 0.5 mg by mouth at bedtime.   0  . amLODipine (NORVASC) 5 MG tablet Take 1 tablet (5 mg total) by mouth daily. Take 1 and a half tablet (7.5 mg) by mouth daily (Patient taking differently: Take 5 mg by mouth daily. ) 45 tablet 2  . amphetamine-dextroamphetamine (ADDERALL XR) 30 MG 24 hr capsule Take 1 capsule (30 mg total) by mouth every morning. 90 capsule 0  . ARIPiprazole (ABILIFY) 5 MG tablet Take 5 mg by mouth at bedtime.     . calcium-vitamin D (OSCAL WITH D) 500-200 MG-UNIT tablet Take 1 tablet by mouth 2 (two) times daily.    . cyanocobalamin (,VITAMIN B-12,) 1000 MCG/ML injection INJECT 1 ML INTRAMUSCULARLY EVERY 21 DAYS (Patient taking differently: Inject 1,000 mcg into the muscle every 21 ( twenty-one) days. ) 3 mL 1  . diclofenac sodium (VOLTAREN) 1 % GEL Apply 2-4 grams to affected joint up to 4 times daily. (Patient taking differently: Apply 1 g topically 3 (three) times daily. ) 400 g 2  . esomeprazole (NEXIUM) 40 MG capsule TAKE 1 CAPSULE BY MOUTH TWICE DAILY BEFORE A MEAL (Patient taking differently: Take 40 mg by mouth 2 (two) times daily before a meal. ) 60 capsule 9  .  Estradiol (YUVAFEM) 10 MCG TABS vaginal tablet Place 1 tablet (10 mcg total) vaginally 3 (three) times a week. Please keep on file. (Patient taking differently: Place 1 tablet vaginally See admin instructions. Place 1 tablet vaginally Tuesday, Thursday and Saturday.) 12 tablet 10  . Eszopiclone 3 MG TABS TAKE 1 TABLET BY MOUTH AT BEDTIME TAKE IMMEDIATLEY BEFORE BEDTIME (Patient taking differently: Take 3 mg by mouth at bedtime. ) 90 tablet 0  . ferrous sulfate 325 (65 FE) MG tablet Take 325 mg by mouth every  Monday, Wednesday, and Friday.     . fluticasone (FLONASE) 50 MCG/ACT nasal spray Place 2 sprays into both nostrils daily as needed (seasonal allergies). (Patient taking differently: Place 1 spray into both nostrils daily as needed for allergies. ) 16 g 5  . KADIAN 40 MG CP24 TAKE 1 CAPSULE TWICE DAILY (Patient taking differently: Take 40 mg by mouth 2 (two) times daily. ) 60 capsule 0  . lamoTRIgine (LAMICTAL) 200 MG tablet Take 100 mg by mouth daily.     Marland Kitchen linaclotide (LINZESS) 290 MCG CAPS capsule Take 290 mcg by mouth daily before breakfast.    . Multiple Vitamins-Minerals (PRESERVISION AREDS 2 PO) Take 1 capsule by mouth 2 (two) times daily.    . NONFORMULARY OR COMPOUNDED ITEM Testosterone propionate 2% in white petrolatum, apply small amount once daily for 5 days per week. (Patient taking differently: Apply 1 application topically See admin instructions. Testosterone propionate 2% in white petrolatum, Apply small amount topically at bedtime on Tuesday, Thursday and Saturday.) 60 each 2  . ondansetron (ZOFRAN ODT) 4 MG disintegrating tablet Take 1 tablet (4 mg total) by mouth every 8 (eight) hours as needed for nausea or vomiting. 20 tablet 1  . ondansetron (ZOFRAN) 4 MG tablet TAKE ONE TABLET EVERY 8 HOURS AS NEEDED FOR NAUSEA OR VOMITING (Patient taking differently: Take 4 mg by mouth every 8 (eight) hours as needed for nausea or vomiting. ) 30 tablet 0  . pentosan polysulfate (ELMIRON) 100 MG capsule Take 200 mg by mouth 2 (two) times daily.     . polyethylene glycol (MIRALAX) 17 g packet Take 17 g by mouth 3 (three) times daily.     . pregabalin (LYRICA) 150 MG capsule TAKE 1 CAPSULE BY MOUTH TWICE DAILY (Patient taking differently: Take 150 mg by mouth 2 (two) times daily. ) 180 capsule 0  . senna (SENOKOT) 8.6 MG tablet Take 2 tablets by mouth at bedtime.    . SF 5000 PLUS 1.1 % CREA dental cream Place 1 application onto teeth at bedtime.     . traMADol (ULTRAM) 50 MG tablet TAKE ONE TABLET  BY MOUTH TWICE DAILY AS NEEDED (Patient taking differently: Take 25 mg by mouth 2 (two) times daily as needed for moderate pain. ) 40 tablet 0  . ursodiol (ACTIGALL) 300 MG capsule Take 1 capsule (300 mg total) by mouth 2 (two) times daily. 60 capsule 5  . naloxegol oxalate (MOVANTIK) 25 MG TABS tablet Take 1 tablet (25 mg total) by mouth daily. (Patient not taking: Reported on 02/27/2019) 30 tablet 3   No facility-administered medications prior to visit.      Per HPI unless specifically indicated in ROS section below Review of Systems Objective:    BP 114/76   Pulse 72   Temp (!) 96.8 F (36 C)   Ht 5' 4.5" (1.638 m)   Wt 123 lb (55.8 kg)   LMP 07/25/1998   BMI 20.79 kg/m   Wt  Readings from Last 3 Encounters:  02/28/19 123 lb (55.8 kg)  01/31/19 122 lb (55.3 kg)  01/02/19 122 lb 6 oz (55.5 kg)     Physical exam: Gen: alert, NAD, not ill appearing Pulm: speaks in complete sentences without increased work of breathing Psych: normal mood, normal thought content      Assessment & Plan:  Ok to stay off aspirin, iron at this time.  Problem List Items Addressed This Visit    Unilateral primary osteoarthritis, left hip - Primary    Severe, sees ortho, planned L hip replacement. Reviewed upcoming plans for surgery as well as reasonable to have surgery-related narcotic treatment through ortho.       Iron deficiency    Iron has been stopped due to constipation. Will stay off at this time ,consider rechecking next labwork.       Encounter for chronic pain management    [place] CSRS reviewed.       Drug-induced constipation    Movantik was approved by insurance given several failures in the past. However at 25mg  dose pt felt it made her chronic narcotics ineffective. Discussed it shouldn't as it's bound to a polymer that limits medication from crossing blood/brain barrier and interacting with pain relief effect of narcotic. Regardless, reasonable to try lower movantik dose - sent in  12.5mg  tablets to trial.       DDD (degenerative disc disease), lumbar    Seeing neurosurgery, discussing possible surgery for progressive lumbar DDD but wants to first recover from upcoming hip replacement.       Chronic pain syndrome       Meds ordered this encounter  Medications  . naloxegol oxalate (MOVANTIK) 12.5 MG TABS tablet    Sig: Take 1 tablet (12.5 mg total) by mouth daily.    Dispense:  30 tablet    Refill:  3    Failed linzess and other bowel regimen - note new sig   No orders of the defined types were placed in this encounter.   I discussed the assessment and treatment plan with the patient. The patient was provided an opportunity to ask questions and all were answered. The patient agreed with the plan and demonstrated an understanding of the instructions. The patient was advised to call back or seek an in-person evaluation if the symptoms worsen or if the condition fails to improve as anticipated.  Follow up plan: No follow-ups on file.  Ria Bush, MD

## 2019-03-03 NOTE — Assessment & Plan Note (Signed)
Iron has been stopped due to constipation. Will stay off at this time ,consider rechecking next labwork.

## 2019-03-03 NOTE — Assessment & Plan Note (Signed)
[place] CSRS reviewed  ?

## 2019-03-03 NOTE — Assessment & Plan Note (Signed)
Severe, sees ortho, planned L hip replacement. Reviewed upcoming plans for surgery as well as reasonable to have surgery-related narcotic treatment through ortho.

## 2019-03-03 NOTE — Assessment & Plan Note (Addendum)
Movantik was approved by insurance given several failures in the past. However at 25mg  dose pt felt it made her chronic narcotics ineffective. Discussed it shouldn't as it's bound to a polymer that limits medication from crossing blood/brain barrier and interacting with pain relief effect of narcotic. Regardless, reasonable to try lower movantik dose - sent in 12.5mg  tablets to trial.

## 2019-03-03 NOTE — Assessment & Plan Note (Signed)
Seeing neurosurgery, discussing possible surgery for progressive lumbar DDD but wants to first recover from upcoming hip replacement.

## 2019-03-04 ENCOUNTER — Encounter (HOSPITAL_COMMUNITY): Payer: Self-pay

## 2019-03-04 ENCOUNTER — Other Ambulatory Visit: Payer: Self-pay

## 2019-03-04 ENCOUNTER — Other Ambulatory Visit: Payer: Self-pay | Admitting: Family Medicine

## 2019-03-04 ENCOUNTER — Encounter (HOSPITAL_COMMUNITY)
Admission: RE | Admit: 2019-03-04 | Discharge: 2019-03-04 | Disposition: A | Discharge status: Home / Self Care | Payer: Medicare Other | Source: Ambulatory Visit | Attending: Orthopaedic Surgery | Admitting: Orthopaedic Surgery

## 2019-03-04 DIAGNOSIS — Z01812 Encounter for preprocedural laboratory examination: Secondary | ICD-10-CM | POA: Diagnosis present

## 2019-03-04 HISTORY — DX: Headache, unspecified: R51.9

## 2019-03-04 HISTORY — DX: Bipolar disorder, unspecified: F31.9

## 2019-03-04 LAB — CBC
HCT: 40.4 % (ref 36.0–46.0)
Hemoglobin: 13 g/dL (ref 12.0–15.0)
MCH: 28.4 pg (ref 26.0–34.0)
MCHC: 32.2 g/dL (ref 30.0–36.0)
MCV: 88.4 fL (ref 80.0–100.0)
Platelets: 341 10*3/uL (ref 150–400)
RBC: 4.57 MIL/uL (ref 3.87–5.11)
RDW: 13.8 % (ref 11.5–15.5)
WBC: 8 10*3/uL (ref 4.0–10.5)
nRBC: 0 % (ref 0.0–0.2)

## 2019-03-04 LAB — BASIC METABOLIC PANEL
Anion gap: 12 (ref 5–15)
BUN: 7 mg/dL — ABNORMAL LOW (ref 8–23)
CO2: 23 mmol/L (ref 22–32)
Calcium: 9.5 mg/dL (ref 8.9–10.3)
Chloride: 100 mmol/L (ref 98–111)
Creatinine, Ser: 0.61 mg/dL (ref 0.44–1.00)
GFR calc Af Amer: >60 mL/min (ref >60)
GFR calc non Af Amer: >60 mL/min (ref >60)
Glucose, Bld: 108 mg/dL — ABNORMAL HIGH (ref 70–99)
Potassium: 4.2 mmol/L (ref 3.5–5.1)
Sodium: 135 mmol/L (ref 135–145)

## 2019-03-04 LAB — SURGICAL PCR SCREEN
MRSA, PCR: NEGATIVE
Staphylococcus aureus: NEGATIVE

## 2019-03-04 NOTE — Pre-Procedure Instructions (Addendum)
Rachel Duran  03/04/2019      [facility] - [place], [place] - [address] [place] [address] Phone: [phone] Fax: [phone]  [place] [address] [place] [address] Phone: [phone] Fax: [phone]    Your procedure is scheduled on Aug. 18  Report to [facility] Entrance A at 10:00 A.M.  Call this number if you have problems the morning of surgery:  [phone]   Remember:  Do not eat  after midnight.   You may drink clear liquids until 9:00 A.M. .  Clear liquids allowed are:                    Water, Juice (non-citric and without pulp), Carbonated beverages, Clear Tea, Black Coffee only, Plain Jell-O only, Gatorade and Plain Popsicles only        :00     Please complete your PRE-SURGERY ENSURE that was provided to you by the morning of surgery.  Please, if able, drink it in one setting. DO NOT SIP.    Take these medicines the morning of surgery with A SIP OF WATER :              Amlodipine (norvasc)             Esomeprazole (nexium)             flonase if needed             kadian             Lamotrigine (lamictal)             linaclotide (linzess)             movantik             zofran if needed             elmiron             pregabalin (lyrica)             Tramadol if needed              Ursodiol              7 days prior to surgery STOP taking any Aspirin (unless otherwise instructed by your surgeon), Aleve, Naproxen, Ibuprofen, Motrin, Advil, Goody's, BC's, all herbal medications, fish oil, and all vitamins.      Do not wear jewelry, make-up or nail polish.  Do not wear lotions, powders, or perfumes, or deodorant.  Do not shave 48 hours prior to surgery.  Men may shave face and neck.  Do not bring valuables to the hospital.  [facility] is not responsible for any belongings or valuables.  Contacts, dentures or bridgework may  not be worn into surgery.  Leave your suitcase in the car.  After surgery it may be brought to your room.  For patients admitted to the hospital, discharge time will be determined by your treatment team.  Patients discharged the day of surgery will not be allowed to drive home.    Special instructions:  - Preparing For Surgery  Before surgery, you can play an important role. Because skin is not sterile, your skin needs to be as free of germs as possible. You can reduce the number of germs on your skin by washing with CHG (chlorahexidine gluconate) Soap before surgery.  CHG is an  antiseptic cleaner which kills germs and bonds with the skin to continue killing germs even after washing.    Oral Hygiene is also important to reduce your risk of infection.  Remember - BRUSH YOUR TEETH THE MORNING OF SURGERY WITH YOUR REGULAR TOOTHPASTE  Please do not use if you have an allergy to CHG or antibacterial soaps. If your skin becomes reddened/irritated stop using the CHG.  Do not shave (including legs and underarms) for at least 48 hours prior to first CHG shower. It is OK to shave your face.  Please follow these instructions carefully.   1. Shower the NIGHT BEFORE SURGERY and the MORNING OF SURGERY with CHG.   2. If you chose to wash your hair, wash your hair first as usual with your normal shampoo.  3. After you shampoo, rinse your hair and body thoroughly to remove the shampoo.  4. Use CHG as you would any other liquid soap. You can apply CHG directly to the skin and wash gently with a scrungie or a clean washcloth.   5. Apply the CHG Soap to your body ONLY FROM THE NECK DOWN.  Do not use on open wounds or open sores. Avoid contact with your eyes, ears, mouth and genitals (private parts). Wash Face and genitals (private parts)  with your normal soap.  6. Wash thoroughly, paying special attention to the area where your surgery will be performed.  7. Thoroughly rinse your body with  warm water from the neck down.  8. DO NOT shower/wash with your normal soap after using and rinsing off the CHG Soap.  9. Pat yourself dry with a CLEAN TOWEL.  10. Wear CLEAN PAJAMAS to bed the night before surgery, wear comfortable clothes the morning of surgery  11. Place CLEAN SHEETS on your bed the night of your first shower and DO NOT SLEEP WITH PETS.    Day of Surgery:  Do not apply any deodorants/lotions.  Please wear clean clothes to the hospital/surgery center.   Remember to brush your teeth WITH YOUR REGULAR TOOTHPASTE.    Please read over the following fact sheets that you were given. Coughing and Deep Breathing, MRSA Information and Surgical Site Infection Prevention

## 2019-03-04 NOTE — Progress Notes (Signed)
PCP - Dr. Shella Spearing [facility] Cardiologist - na  Chest x-ray - na EKG - na Stress Test - na ECHO - 2014 Cardiac Cath - na  Sleep Study - na CPAP -   Fasting Blood Sugar - na Checks Blood Sugar _____ times a day  Blood Thinner Instructions:na Aspirin Instructions: na  Anesthesia review:   Patient denies shortness of breath, fever, cough and chest pain at PAT appointment   Patient verbalized understanding of instructions that were given to them at the PAT appointment. Patient was also instructed that they will need to review over the PAT instructions again at home before surgery.

## 2019-03-04 NOTE — Telephone Encounter (Signed)
Name of Medication: Adderall XR Name of Pharmacy: [facility] or Written Date and Quantity: 12/12/18, #90 Last Office Visit and Type: 02/28/19, pain f/u Next Office Visit and Type: 3 mo pain f/u Last Controlled Substance Agreement Date: 03/14/17 Last UDS: 03/14/17

## 2019-03-06 ENCOUNTER — Encounter: Payer: Self-pay | Admitting: Family Medicine

## 2019-03-06 NOTE — Telephone Encounter (Signed)
Eprescribed.

## 2019-03-08 ENCOUNTER — Other Ambulatory Visit: Payer: Self-pay

## 2019-03-08 ENCOUNTER — Other Ambulatory Visit
Admission: RE | Admit: 2019-03-08 | Discharge: 2019-03-08 | Disposition: A | Discharge status: Home / Self Care | Payer: Medicare Other | Source: Ambulatory Visit | Attending: Orthopaedic Surgery | Admitting: Orthopaedic Surgery

## 2019-03-08 DIAGNOSIS — Z01812 Encounter for preprocedural laboratory examination: Secondary | ICD-10-CM | POA: Diagnosis present

## 2019-03-08 DIAGNOSIS — Z20828 Contact with and (suspected) exposure to other viral communicable diseases: Secondary | ICD-10-CM | POA: Diagnosis not present

## 2019-03-09 LAB — SARS CORONAVIRUS 2 (TAT 6-24 HRS): SARS Coronavirus 2: NEGATIVE

## 2019-03-12 ENCOUNTER — Ambulatory Visit (HOSPITAL_COMMUNITY): Payer: Medicare Other

## 2019-03-12 ENCOUNTER — Encounter (HOSPITAL_COMMUNITY)
Admission: RE | Disposition: A | Discharge status: Home / Self Care | Payer: Self-pay | Source: Home / Self Care | Attending: Orthopaedic Surgery

## 2019-03-12 ENCOUNTER — Ambulatory Visit (HOSPITAL_COMMUNITY): Payer: Medicare Other | Admitting: Anesthesiology

## 2019-03-12 ENCOUNTER — Encounter (HOSPITAL_COMMUNITY): Payer: Self-pay | Admitting: Certified Registered"

## 2019-03-12 ENCOUNTER — Other Ambulatory Visit: Payer: Self-pay

## 2019-03-12 ENCOUNTER — Inpatient Hospital Stay (HOSPITAL_COMMUNITY): Payer: Medicare Other

## 2019-03-12 ENCOUNTER — Observation Stay (HOSPITAL_COMMUNITY)
Admission: RE | Admit: 2019-03-12 | Discharge: 2019-03-14 | Disposition: A | Discharge status: Home / Self Care | Payer: Medicare Other | Attending: Orthopaedic Surgery | Admitting: Orthopaedic Surgery

## 2019-03-12 DIAGNOSIS — M4186 Other forms of scoliosis, lumbar region: Secondary | ICD-10-CM | POA: Diagnosis not present

## 2019-03-12 DIAGNOSIS — M48061 Spinal stenosis, lumbar region without neurogenic claudication: Secondary | ICD-10-CM | POA: Diagnosis not present

## 2019-03-12 DIAGNOSIS — M797 Fibromyalgia: Secondary | ICD-10-CM | POA: Diagnosis not present

## 2019-03-12 DIAGNOSIS — I7300 Raynaud's syndrome without gangrene: Secondary | ICD-10-CM | POA: Insufficient documentation

## 2019-03-12 DIAGNOSIS — Z7951 Long term (current) use of inhaled steroids: Secondary | ICD-10-CM | POA: Diagnosis not present

## 2019-03-12 DIAGNOSIS — M160 Bilateral primary osteoarthritis of hip: Principal | ICD-10-CM | POA: Insufficient documentation

## 2019-03-12 DIAGNOSIS — Z419 Encounter for procedure for purposes other than remedying health state, unspecified: Secondary | ICD-10-CM

## 2019-03-12 DIAGNOSIS — H3530 Unspecified macular degeneration: Secondary | ICD-10-CM | POA: Insufficient documentation

## 2019-03-12 DIAGNOSIS — G8929 Other chronic pain: Secondary | ICD-10-CM | POA: Insufficient documentation

## 2019-03-12 DIAGNOSIS — F319 Bipolar disorder, unspecified: Secondary | ICD-10-CM | POA: Insufficient documentation

## 2019-03-12 DIAGNOSIS — Z96642 Presence of left artificial hip joint: Secondary | ICD-10-CM

## 2019-03-12 DIAGNOSIS — K219 Gastro-esophageal reflux disease without esophagitis: Secondary | ICD-10-CM | POA: Diagnosis not present

## 2019-03-12 DIAGNOSIS — Z981 Arthrodesis status: Secondary | ICD-10-CM | POA: Diagnosis not present

## 2019-03-12 DIAGNOSIS — M5136 Other intervertebral disc degeneration, lumbar region: Secondary | ICD-10-CM | POA: Diagnosis not present

## 2019-03-12 DIAGNOSIS — M1612 Unilateral primary osteoarthritis, left hip: Secondary | ICD-10-CM

## 2019-03-12 DIAGNOSIS — I2510 Atherosclerotic heart disease of native coronary artery without angina pectoris: Secondary | ICD-10-CM | POA: Diagnosis not present

## 2019-03-12 DIAGNOSIS — K589 Irritable bowel syndrome without diarrhea: Secondary | ICD-10-CM | POA: Insufficient documentation

## 2019-03-12 DIAGNOSIS — F419 Anxiety disorder, unspecified: Secondary | ICD-10-CM | POA: Insufficient documentation

## 2019-03-12 DIAGNOSIS — Z79899 Other long term (current) drug therapy: Secondary | ICD-10-CM | POA: Insufficient documentation

## 2019-03-12 DIAGNOSIS — Z791 Long term (current) use of non-steroidal anti-inflammatories (NSAID): Secondary | ICD-10-CM | POA: Insufficient documentation

## 2019-03-12 HISTORY — PX: TOTAL HIP ARTHROPLASTY: SHX124

## 2019-03-12 SURGERY — ARTHROPLASTY, HIP, TOTAL, ANTERIOR APPROACH
Anesthesia: Spinal | Laterality: Left

## 2019-03-12 MED ORDER — BUPIVACAINE IN DEXTROSE 0.75-8.25 % IT SOLN
INTRATHECAL | Status: DC | PRN
  Administered 2019-03-12: 2 mL via INTRATHECAL

## 2019-03-12 MED ORDER — LAMOTRIGINE 100 MG PO TABS
100.0000 mg | ORAL_TABLET | Freq: Every day | ORAL | Status: DC
  Administered 2019-03-14: 100 mg via ORAL
  Filled 2019-03-12 (×2): qty 1

## 2019-03-12 MED ORDER — KETOROLAC TROMETHAMINE 15 MG/ML IJ SOLN
7.5000 mg | Freq: Four times a day (QID) | INTRAMUSCULAR | Status: DC | PRN
  Administered 2019-03-12 – 2019-03-13 (×3): 7.5 mg via INTRAVENOUS
  Filled 2019-03-12 (×3): qty 1

## 2019-03-12 MED ORDER — FENTANYL CITRATE (PF) 250 MCG/5ML IJ SOLN
INTRAMUSCULAR | Status: DC | PRN
  Administered 2019-03-12: 100 ug via INTRAVENOUS

## 2019-03-12 MED ORDER — ALUM & MAG HYDROXIDE-SIMETH 200-200-20 MG/5ML PO SUSP: 30.0000 mL | ORAL | Status: DC | PRN

## 2019-03-12 MED ORDER — LINACLOTIDE 145 MCG PO CAPS
290.0000 ug | ORAL_CAPSULE | Freq: Every day | ORAL | Status: DC
  Administered 2019-03-13 – 2019-03-14 (×2): 290 ug via ORAL
  Filled 2019-03-12 (×2): qty 2

## 2019-03-12 MED ORDER — PROPOFOL 500 MG/50ML IV EMUL
INTRAVENOUS | Status: DC | PRN
  Administered 2019-03-12: 100 ug/kg/min via INTRAVENOUS

## 2019-03-12 MED ORDER — PHENOL 1.4 % MT LIQD: 1.0000 | OROMUCOSAL | Status: DC | PRN

## 2019-03-12 MED ORDER — ACETAMINOPHEN 325 MG PO TABS: 325.0000 mg | ORAL_TABLET | Freq: Four times a day (QID) | ORAL | Status: DC | PRN

## 2019-03-12 MED ORDER — 0.9 % SODIUM CHLORIDE (POUR BTL) OPTIME
TOPICAL | Status: DC | PRN
  Administered 2019-03-12: 1000 mL

## 2019-03-12 MED ORDER — EPHEDRINE 5 MG/ML INJ
INTRAVENOUS | Status: AC
  Filled 2019-03-12: qty 10

## 2019-03-12 MED ORDER — LACTATED RINGERS IV SOLN
INTRAVENOUS | Status: DC
  Administered 2019-03-12: 11:00:00 via INTRAVENOUS

## 2019-03-12 MED ORDER — PHENYLEPHRINE 40 MCG/ML (10ML) SYRINGE FOR IV PUSH (FOR BLOOD PRESSURE SUPPORT)
PREFILLED_SYRINGE | INTRAVENOUS | Status: AC
  Filled 2019-03-12: qty 10

## 2019-03-12 MED ORDER — MIDAZOLAM HCL 5 MG/5ML IJ SOLN
INTRAMUSCULAR | Status: DC | PRN
  Administered 2019-03-12: 2 mg via INTRAVENOUS

## 2019-03-12 MED ORDER — PROPOFOL 10 MG/ML IV BOLUS
INTRAVENOUS | Status: AC
  Filled 2019-03-12: qty 20

## 2019-03-12 MED ORDER — ARIPIPRAZOLE 5 MG PO TABS
5.0000 mg | ORAL_TABLET | Freq: Every day | ORAL | Status: DC
  Administered 2019-03-12 – 2019-03-13 (×2): 5 mg via ORAL
  Filled 2019-03-12 (×2): qty 1

## 2019-03-12 MED ORDER — AMLODIPINE BESYLATE 5 MG PO TABS
5.0000 mg | ORAL_TABLET | Freq: Every day | ORAL | Status: DC
  Administered 2019-03-13 – 2019-03-14 (×2): 5 mg via ORAL
  Filled 2019-03-12 (×2): qty 1

## 2019-03-12 MED ORDER — PROPOFOL 1000 MG/100ML IV EMUL
INTRAVENOUS | Status: AC
  Filled 2019-03-12: qty 100

## 2019-03-12 MED ORDER — FENTANYL CITRATE (PF) 100 MCG/2ML IJ SOLN: 25.0000 ug | INTRAMUSCULAR | Status: DC | PRN

## 2019-03-12 MED ORDER — METOCLOPRAMIDE HCL 5 MG/ML IJ SOLN: 5.0000 mg | Freq: Three times a day (TID) | INTRAMUSCULAR | Status: DC | PRN

## 2019-03-12 MED ORDER — SODIUM CHLORIDE 0.9 % IV SOLN
INTRAVENOUS | Status: DC
  Administered 2019-03-12: 18:00:00 via INTRAVENOUS

## 2019-03-12 MED ORDER — CEFAZOLIN SODIUM-DEXTROSE 1-4 GM/50ML-% IV SOLN
1.0000 g | Freq: Four times a day (QID) | INTRAVENOUS | Status: AC
  Administered 2019-03-12 (×2): 1 g via INTRAVENOUS
  Filled 2019-03-12 (×2): qty 50

## 2019-03-12 MED ORDER — STERILE WATER FOR IRRIGATION IR SOLN
Status: DC | PRN
  Administered 2019-03-12: 1000 mL

## 2019-03-12 MED ORDER — MIDAZOLAM HCL 2 MG/2ML IJ SOLN
INTRAMUSCULAR | Status: AC
  Filled 2019-03-12: qty 2

## 2019-03-12 MED ORDER — SODIUM CHLORIDE 0.9 % IV SOLN
INTRAVENOUS | Status: DC | PRN
  Administered 2019-03-12: 25 ug/min via INTRAVENOUS

## 2019-03-12 MED ORDER — MENTHOL 3 MG MT LOZG: 1.0000 | LOZENGE | OROMUCOSAL | Status: DC | PRN

## 2019-03-12 MED ORDER — ALPRAZOLAM 0.5 MG PO TABS
0.5000 mg | ORAL_TABLET | Freq: Every day | ORAL | Status: DC
  Administered 2019-03-12 – 2019-03-13 (×2): 0.5 mg via ORAL
  Filled 2019-03-12 (×2): qty 1

## 2019-03-12 MED ORDER — URSODIOL 300 MG PO CAPS
300.0000 mg | ORAL_CAPSULE | Freq: Two times a day (BID) | ORAL | Status: DC
  Administered 2019-03-12 – 2019-03-14 (×4): 300 mg via ORAL
  Filled 2019-03-12 (×5): qty 1

## 2019-03-12 MED ORDER — ZOLPIDEM TARTRATE 5 MG PO TABS: 5.0000 mg | ORAL_TABLET | Freq: Every evening | ORAL | Status: DC | PRN

## 2019-03-12 MED ORDER — MORPHINE SULFATE ER 15 MG PO TBCR
30.0000 mg | EXTENDED_RELEASE_TABLET | Freq: Two times a day (BID) | ORAL | Status: DC
  Filled 2019-03-12: qty 2

## 2019-03-12 MED ORDER — FENTANYL CITRATE (PF) 250 MCG/5ML IJ SOLN
INTRAMUSCULAR | Status: AC
  Filled 2019-03-12: qty 5

## 2019-03-12 MED ORDER — ASPIRIN 81 MG PO CHEW
81.0000 mg | CHEWABLE_TABLET | Freq: Two times a day (BID) | ORAL | Status: DC
  Administered 2019-03-12 – 2019-03-14 (×4): 81 mg via ORAL
  Filled 2019-03-12 (×4): qty 1

## 2019-03-12 MED ORDER — PHENYLEPHRINE 40 MCG/ML (10ML) SYRINGE FOR IV PUSH (FOR BLOOD PRESSURE SUPPORT)
PREFILLED_SYRINGE | INTRAVENOUS | Status: DC | PRN
  Administered 2019-03-12 (×2): 80 ug via INTRAVENOUS

## 2019-03-12 MED ORDER — PREGABALIN 75 MG PO CAPS
150.0000 mg | ORAL_CAPSULE | Freq: Two times a day (BID) | ORAL | Status: DC
  Administered 2019-03-12 – 2019-03-14 (×4): 150 mg via ORAL
  Filled 2019-03-12 (×4): qty 2

## 2019-03-12 MED ORDER — METOCLOPRAMIDE HCL 5 MG PO TABS: 5.0000 mg | ORAL_TABLET | Freq: Three times a day (TID) | ORAL | Status: DC | PRN

## 2019-03-12 MED ORDER — ONDANSETRON HCL 4 MG/2ML IJ SOLN
INTRAMUSCULAR | Status: AC
  Filled 2019-03-12: qty 2

## 2019-03-12 MED ORDER — ONDANSETRON HCL 4 MG/2ML IJ SOLN
4.0000 mg | Freq: Four times a day (QID) | INTRAMUSCULAR | Status: DC | PRN
  Administered 2019-03-12: 4 mg via INTRAVENOUS

## 2019-03-12 MED ORDER — OXYCODONE HCL 5 MG/5ML PO SOLN: 5.0000 mg | Freq: Once | ORAL | Status: DC | PRN

## 2019-03-12 MED ORDER — LIDOCAINE 2% (20 MG/ML) 5 ML SYRINGE
INTRAMUSCULAR | Status: AC
  Filled 2019-03-12: qty 5

## 2019-03-12 MED ORDER — HYDROMORPHONE HCL 1 MG/ML IJ SOLN
0.5000 mg | INTRAMUSCULAR | Status: DC | PRN
  Administered 2019-03-12: 1 mg via INTRAVENOUS
  Filled 2019-03-12: qty 1

## 2019-03-12 MED ORDER — ONDANSETRON HCL 4 MG PO TABS
4.0000 mg | ORAL_TABLET | Freq: Four times a day (QID) | ORAL | Status: DC | PRN
  Administered 2019-03-13: 4 mg via ORAL
  Filled 2019-03-12: qty 1

## 2019-03-12 MED ORDER — DOCUSATE SODIUM 100 MG PO CAPS
100.0000 mg | ORAL_CAPSULE | Freq: Two times a day (BID) | ORAL | Status: DC
  Administered 2019-03-12 – 2019-03-14 (×4): 100 mg via ORAL
  Filled 2019-03-12 (×4): qty 1

## 2019-03-12 MED ORDER — ROCURONIUM BROMIDE 10 MG/ML (PF) SYRINGE
PREFILLED_SYRINGE | INTRAVENOUS | Status: AC
  Filled 2019-03-12: qty 10

## 2019-03-12 MED ORDER — OXYCODONE HCL 5 MG PO TABS
10.0000 mg | ORAL_TABLET | ORAL | Status: DC | PRN
  Administered 2019-03-12: 10 mg via ORAL
  Filled 2019-03-12: qty 2

## 2019-03-12 MED ORDER — PENTOSAN POLYSULFATE SODIUM 100 MG PO CAPS
200.0000 mg | ORAL_CAPSULE | Freq: Two times a day (BID) | ORAL | Status: DC
  Administered 2019-03-12 – 2019-03-14 (×4): 200 mg via ORAL
  Filled 2019-03-12 (×5): qty 2

## 2019-03-12 MED ORDER — ONDANSETRON HCL 4 MG/2ML IJ SOLN: 4.0000 mg | Freq: Once | INTRAMUSCULAR | Status: DC | PRN

## 2019-03-12 MED ORDER — MORPHINE SULFATE ER 40 MG PO CP24: 40.0000 mg | ORAL_CAPSULE | Freq: Two times a day (BID) | ORAL | Status: DC

## 2019-03-12 MED ORDER — PANTOPRAZOLE SODIUM 40 MG PO TBEC
40.0000 mg | DELAYED_RELEASE_TABLET | Freq: Every day | ORAL | Status: DC
  Administered 2019-03-12 – 2019-03-14 (×3): 40 mg via ORAL
  Filled 2019-03-12 (×3): qty 1

## 2019-03-12 MED ORDER — DIPHENHYDRAMINE HCL 12.5 MG/5ML PO ELIX: 12.5000 mg | ORAL_SOLUTION | ORAL | Status: DC | PRN

## 2019-03-12 MED ORDER — SUCCINYLCHOLINE CHLORIDE 200 MG/10ML IV SOSY
PREFILLED_SYRINGE | INTRAVENOUS | Status: AC
  Filled 2019-03-12: qty 10

## 2019-03-12 MED ORDER — FERROUS SULFATE 325 (65 FE) MG PO TABS
325.0000 mg | ORAL_TABLET | ORAL | Status: DC
  Administered 2019-03-13: 325 mg via ORAL
  Filled 2019-03-12 (×2): qty 1

## 2019-03-12 MED ORDER — AMPHETAMINE-DEXTROAMPHET ER 10 MG PO CP24
30.0000 mg | ORAL_CAPSULE | Freq: Every morning | ORAL | Status: DC
  Administered 2019-03-13 – 2019-03-14 (×2): 30 mg via ORAL
  Filled 2019-03-12 (×2): qty 3

## 2019-03-12 MED ORDER — CEFAZOLIN SODIUM-DEXTROSE 2-4 GM/100ML-% IV SOLN
2.0000 g | INTRAVENOUS | Status: AC
  Administered 2019-03-12: 2 g via INTRAVENOUS
  Filled 2019-03-12: qty 100

## 2019-03-12 MED ORDER — METHOCARBAMOL 500 MG PO TABS
500.0000 mg | ORAL_TABLET | Freq: Four times a day (QID) | ORAL | Status: DC | PRN
  Administered 2019-03-13: 500 mg via ORAL
  Filled 2019-03-12: qty 1

## 2019-03-12 MED ORDER — METHOCARBAMOL 1000 MG/10ML IJ SOLN
500.0000 mg | Freq: Four times a day (QID) | INTRAVENOUS | Status: DC | PRN
  Filled 2019-03-12: qty 5

## 2019-03-12 MED ORDER — OXYCODONE HCL 5 MG PO TABS: 5.0000 mg | ORAL_TABLET | Freq: Once | ORAL | Status: DC | PRN

## 2019-03-12 MED ORDER — OXYCODONE HCL 5 MG PO TABS
5.0000 mg | ORAL_TABLET | ORAL | Status: DC | PRN
  Administered 2019-03-13: 10 mg via ORAL
  Filled 2019-03-12 (×2): qty 2

## 2019-03-12 SURGICAL SUPPLY — 54 items
APL SKNCLS STERI-STRIP NONHPOA (GAUZE/BANDAGES/DRESSINGS) ×1
BENZOIN TINCTURE PRP APPL 2/3 (GAUZE/BANDAGES/DRESSINGS) ×2 IMPLANT
BLADE CLIPPER SURG (BLADE) IMPLANT
BLADE SAW SGTL 18X1.27X75 (BLADE) ×2 IMPLANT
COVER SURGICAL LIGHT HANDLE (MISCELLANEOUS) ×2 IMPLANT
COVER WAND RF STERILE (DRAPES) ×2 IMPLANT
DRAPE C-ARM 42X72 X-RAY (DRAPES) ×2 IMPLANT
DRAPE STERI IOBAN 125X83 (DRAPES) ×2 IMPLANT
DRAPE U-SHAPE 47X51 STRL (DRAPES) ×6 IMPLANT
DRSG AQUACEL AG ADV 3.5X10 (GAUZE/BANDAGES/DRESSINGS) ×2 IMPLANT
DURAPREP 26ML APPLICATOR (WOUND CARE) ×2 IMPLANT
ELECT BLADE 4.0 EZ CLEAN MEGAD (MISCELLANEOUS) ×2
ELECT BLADE 6.5 EXT (BLADE) IMPLANT
ELECT REM PT RETURN 9FT ADLT (ELECTROSURGICAL) ×2
ELECTRODE BLDE 4.0 EZ CLN MEGD (MISCELLANEOUS) ×1 IMPLANT
ELECTRODE REM PT RTRN 9FT ADLT (ELECTROSURGICAL) ×1 IMPLANT
FACESHIELD WRAPAROUND (MASK) ×4 IMPLANT
FACESHIELD WRAPAROUND OR TEAM (MASK) ×2 IMPLANT
GLOVE BIOGEL PI IND STRL 8 (GLOVE) ×2 IMPLANT
GLOVE BIOGEL PI INDICATOR 8 (GLOVE) ×2
GLOVE ECLIPSE 8.0 STRL XLNG CF (GLOVE) ×2 IMPLANT
GLOVE ORTHO TXT STRL SZ7.5 (GLOVE) ×4 IMPLANT
GOWN STRL REUS W/ TWL LRG LVL3 (GOWN DISPOSABLE) ×2 IMPLANT
GOWN STRL REUS W/ TWL XL LVL3 (GOWN DISPOSABLE) ×2 IMPLANT
GOWN STRL REUS W/TWL LRG LVL3 (GOWN DISPOSABLE) ×4
GOWN STRL REUS W/TWL XL LVL3 (GOWN DISPOSABLE) ×4
HANDPIECE INTERPULSE COAX TIP (DISPOSABLE) ×2
HEAD CERAMIC DELTA 36 PLUS 1.5 (Hips) ×1 IMPLANT
KIT BASIN OR (CUSTOM PROCEDURE TRAY) ×2 IMPLANT
KIT TURNOVER KIT B (KITS) ×2 IMPLANT
LINER ACETAB NEUTRAL 36ID 520D (Liner) ×1 IMPLANT
MANIFOLD NEPTUNE II (INSTRUMENTS) ×2 IMPLANT
NS IRRIG 1000ML POUR BTL (IV SOLUTION) ×2 IMPLANT
PACK TOTAL JOINT (CUSTOM PROCEDURE TRAY) ×2 IMPLANT
PAD ARMBOARD 7.5X6 YLW CONV (MISCELLANEOUS) ×2 IMPLANT
PIN SECTOR W/GRIP ACE CUP 52MM (Hips) ×1 IMPLANT
SET HNDPC FAN SPRY TIP SCT (DISPOSABLE) ×1 IMPLANT
STAPLER VISISTAT 35W (STAPLE) IMPLANT
STEM FEM ACTIS STD SZ4 (Stem) ×1 IMPLANT
STRIP CLOSURE SKIN 1/2X4 (GAUZE/BANDAGES/DRESSINGS) ×4 IMPLANT
SUT ETHIBOND NAB CT1 #1 30IN (SUTURE) ×2 IMPLANT
SUT MNCRL AB 4-0 PS2 18 (SUTURE) IMPLANT
SUT VIC AB 0 CT1 27 (SUTURE) ×2
SUT VIC AB 0 CT1 27XBRD ANBCTR (SUTURE) ×1 IMPLANT
SUT VIC AB 1 CT1 27 (SUTURE) ×2
SUT VIC AB 1 CT1 27XBRD ANBCTR (SUTURE) ×1 IMPLANT
SUT VIC AB 2-0 CT1 27 (SUTURE) ×2
SUT VIC AB 2-0 CT1 TAPERPNT 27 (SUTURE) ×1 IMPLANT
TOWEL GREEN STERILE (TOWEL DISPOSABLE) ×2 IMPLANT
TOWEL GREEN STERILE FF (TOWEL DISPOSABLE) ×2 IMPLANT
TRAY CATH 16FR W/PLASTIC CATH (SET/KITS/TRAYS/PACK) IMPLANT
TRAY FOLEY W/BAG SLVR 16FR (SET/KITS/TRAYS/PACK)
TRAY FOLEY W/BAG SLVR 16FR ST (SET/KITS/TRAYS/PACK) IMPLANT
WATER STERILE IRR 1000ML POUR (IV SOLUTION) ×4 IMPLANT

## 2019-03-12 NOTE — Op Note (Signed)
NAME: Rachel Duran, Rachel Duran MEDICAL RECORD [id number] ACCOUNT [id number] DATE OF BIRTH:02/02/1956 FACILITY: [facility] LOCATION: [facility] PHYSICIAN:CHRISTOPHER Kerry Fort, MD  OPERATIVE REPORT  DATE OF PROCEDURE:  03/12/2019  PREOPERATIVE DIAGNOSIS:  Primary osteoarthritis and degenerative joint disease, left hip.  POSTOPERATIVE DIAGNOSIS:  Primary osteoarthritis and degenerative joint disease, left hip.  PROCEDURE:  Left total hip arthroplasty through direct anterior approach.  IMPLANTS:  DePuy Sector Gription acetabular component size 52, size 36+0 neutral polyethylene liner, size 4 Actis femoral component with standard offset, size 36+1.5 ceramic hip ball.  SURGEON:  [name]. [name], MD  ASSISTANT:  Erskine Emery, PA-C.  ANESTHESIA:  Spinal.  ANTIBIOTICS:  Two grams IV Ancef.  ESTIMATED BLOOD LOSS:  425 mL  COMPLICATIONS:  None.  INDICATIONS:  The patient is a very pleasant 63 year old female with debilitating arthritis involving her left hip.  She has been having left hip pain for a while, but then fell in March of this year, landing directly on that hip.  She then developed  severe pain in her left hip.  Her x-rays did not show severe arthritis of the left hip, but there was some joint space narrowing.  MRI of the left hip did confirm moderate to severe arthritis of the left hip.  There is significant edema in the femoral  head and acetabulum and cartilage changes in the hip.  At this point, her hip pain is daily and is detrimentally affecting her activities of daily living, her mobility her quality of life to the point she does wish to proceed with left total hip  arthroplasty.  We had a long and thorough discussion in the office about this.  I showed her a hip model and explained in detail what the surgery involves, including a thorough discussion of the risk of acute blood loss anemia, nerve and vessel injury,  fracture, infection, dislocation, DVT and implant failure.   We talked about our goals being decreased pain, improve mobility and overall improve quality of life.  DESCRIPTION OF PROCEDURE:  After informed consent was obtained and appropriate left hip was marked, she was brought to the operating room and sat up on a stretcher where spinal anesthesia was then obtained.  We then laid her supine on the stretcher.  I  was able to assess her leg lengths and found her leg lengths to be equal.  A Foley catheter was placed.  We placed traction boots on both her feet and we placed her supine on the Hana fracture table, the perineal post in place and both legs in line  skeletal traction device and no traction applied.  We then assessed her left hip radiographically, so I could have a good before pictured to assess her leg length and offset since I want to match that picture based on her normal leg lengths.  Her left  operative hip was then prepped and draped with DuraPrep and sterile drapes.  A time-out was called.  She was identified, correct patient, correct left hip.  I then made an incision just inferior and posterior to the anterior superior iliac spine and  carried this obliquely down the leg.  We dissected down tensor fascia lata muscle.  Tensor fascia was then divided longitudinally to proceed with direct anterior approach to the hip.  We identified and cauterized circumflex vessels.  I then identified  the hip capsule of the hip capsule in an L-type format, finding moderate joint effusion and significant osteophytes around the femoral head and neck.  We placed curved  retractors around the medial and lateral femoral neck and then our femoral neck cut  with an oscillating saw just proximal to the lesser trochanter and completed this with an osteotome.  We placed a corkscrew guide in the femoral head and removed the femoral heads entirety and found a wide area with a surprisingly significant cartilage  loss.  We then placed a bent Hohmann over the medial acetabular rim  and removed remnants of the acetabular labrum and other debris.  We then began reaming under direct visualization from a size 44 reamer in stepwise increments up to a size 51 with all  reamers under direct visualization, the last reamer under direct fluoroscopy, so we could obtain our depth of reaming, our inclination and anteversion.  I then placed the real DePuy Sector Gription acetabular component size 52 and a 36+0 neutral  polyethylene liner for that size acetabular component.  Attention was then turned to the femur.  With the leg externally rotated to 120 degrees, extended and adducted, we were able to place a Mueller retractor medially and Hohman retractor behind the  greater trochanter.  We released lateral joint capsule and used a box-cutting osteotome to enter the femoral canal and a rongeur to lateralize.  We then began broaching using the Actis broaching system from a size 0 up to a size 4.  With a size 4 in  place, we trialed a standard offset femoral neck and a 36+1.5 hip ball.  We brought the leg back over and up and with traction and internal rotation, reducing the pelvis and we were pleased with leg length, offset, range of motion and stability assessed  clinically and radiographically.  We then dislocated the hip and removed the trial components.  We placed the real standard offset Actis femoral component size 4 and the real 36+1.5 ceramic hip ball, reduced this in the acetabulum and again we were  pleased with our stability, leg length, range of motion and offset.  We then irrigated the soft tissue with normal saline solution using pulsatile lavage.  We were able to close the joint capsule with interrupted #1 Ethibond suture, followed by closing  the tensor fascia with #1 Vicryl.  We closed the deep tissue with 0 Vicryl followed by 2-0 Vicryl, subcutaneous tissue and 4-0 Monocryl, the closed the subcuticular tissue.  Steri-Strips and well-padded Aquacel dressing was applied.  She was taken  off of  the Hana table and taken to recovery room in stable condition.  All final counts were correct.  There were no complications noted.  Of note, Benita Stabile, PA-C, assisted during the entire case and his assistance was crucial for facilitating all aspects of  this case.  TN/NUANCE  D:03/12/2019 T:03/12/2019 JOB:[id number]

## 2019-03-12 NOTE — Brief Op Note (Signed)
03/12/2019  1:14 PM  PATIENT:  Rachel Duran  63 y.o. female  PRE-OPERATIVE DIAGNOSIS:  LEFT HIP OSTEOARTHRITIS  POST-OPERATIVE DIAGNOSIS:  LEFT HIP OSTEOARTHRITIS  PROCEDURE:  Procedure(s): LEFT TOTAL HIP ARTHROPLASTY ANTERIOR APPROACH (Left)  SURGEON:  Surgeon(s) and Role:    Mcarthur Rossetti, MD - Primary  PHYSICIAN ASSISTANT: Benita Stabile, PA-C  ANESTHESIA:   spinal  EBL:  300 mL   COUNTS:  YES  PLAN OF CARE: Admit to inpatient   PATIENT DISPOSITION:  PACU - hemodynamically stable.   Delay start of Pharmacological VTE agent (>24hrs) due to surgical blood loss or risk of bleeding: no

## 2019-03-12 NOTE — Progress Notes (Signed)
Orthopedic Tech Progress Note Patient Details:  Rachel Duran Feb 14, 1956 [id number]  Ortho Devices Ortho Device/Splint Location: Trapeze bar Ortho Device/Splint Interventions: Application   Post Interventions Instructions Provided: Care of device   [name] 03/12/2019, 3:36 PM

## 2019-03-12 NOTE — Progress Notes (Signed)
Pt stated  " I don't take Ambien I take Lunesta." Call out to Pharmacy for availability. Spoke with [facility] is non formulary. While speaking with Ebony Hail and patient, pt declined melatonin and benadryl. Pt stated she will get her husband to bring her [place] tomorrow.

## 2019-03-12 NOTE — Anesthesia Preprocedure Evaluation (Addendum)
Anesthesia Evaluation  Patient identified by MRN, date of birth, ID band Patient awake    Reviewed: Allergy & Precautions, NPO status , Patient's Chart, lab work & pertinent test results  History of Anesthesia Complications (+) PONVNegative for: history of anesthetic complications  Airway Mallampati: II  TM Distance: >3 FB Neck ROM: Full    Dental  (+) Teeth Intact   Pulmonary neg pulmonary ROS,    Pulmonary exam normal        Cardiovascular negative cardio ROS Normal cardiovascular exam     Neuro/Psych PSYCHIATRIC DISORDERS Anxiety Depression Bipolar Disorder    GI/Hepatic Neg liver ROS, GERD  ,  Endo/Other  negative endocrine ROS  Renal/GU negative Renal ROS  negative genitourinary   Musculoskeletal  (+) Arthritis , Fibromyalgia -  Abdominal   Peds  Hematology negative hematology ROS (+)   Anesthesia Other Findings   Reproductive/Obstetrics                            Anesthesia Physical Anesthesia Plan  ASA: II  Anesthesia Plan: Spinal   Post-op Pain Management:    Induction:   PONV Risk Score and Plan: 3 and Propofol infusion, TIVA, Midazolam and Treatment may vary due to age or medical condition  Airway Management Planned: Nasal Cannula and Simple Face Mask  Additional Equipment: None  Intra-op Plan:   Post-operative Plan:   Informed Consent: I have reviewed the patients History and Physical, chart, labs and discussed the procedure including the risks, benefits and alternatives for the proposed anesthesia with the patient or authorized representative who has indicated his/her understanding and acceptance.       Plan Discussed with:   Anesthesia Plan Comments:        Anesthesia Quick Evaluation

## 2019-03-12 NOTE — Transfer of Care (Signed)
Immediate Anesthesia Transfer of Care Note  Patient: Rachel Duran  Procedure(s) Performed: LEFT TOTAL HIP ARTHROPLASTY ANTERIOR APPROACH (Left )  Patient Location: PACU  Anesthesia Type:MAC and Spinal  Level of Consciousness: drowsy, patient cooperative and responds to stimulation  Airway & Oxygen Therapy: Patient Spontanous Breathing and Patient connected to nasal cannula oxygen  Post-op Assessment: Report given to RN and Post -op Vital signs reviewed and stable  Post vital signs: Reviewed and stable  Last Vitals:  Vitals Value Taken Time  BP 90/54 03/12/19 1329  Temp    Pulse 74 03/12/19 1331  Resp 11 03/12/19 1331  SpO2 100 % 03/12/19 1331  Vitals shown include unvalidated device data.  Last Pain:  Vitals:   03/12/19 1023  TempSrc: Oral         Complications: No apparent anesthesia complications

## 2019-03-12 NOTE — Anesthesia Procedure Notes (Signed)
Spinal  Patient location during procedure: OR Staffing Anesthesiologist: Ayla Dunigan E, MD Performed: anesthesiologist  Preanesthetic Checklist Completed: patient identified, surgical consent, pre-op evaluation, timeout performed, IV checked, risks and benefits discussed and monitors and equipment checked Spinal Block Patient position: sitting Prep: site prepped and draped and DuraPrep Patient monitoring: continuous pulse ox, blood pressure and heart rate Approach: midline Location: L3-4 Injection technique: single-shot Needle Needle type: Pencan  Needle gauge: 24 G Needle length: 9 cm Additional Notes Functioning IV was confirmed and monitors were applied. Sterile prep and drape, including hand hygiene and sterile gloves were used. The patient was positioned and the spine was prepped. The skin was anesthetized with lidocaine.  Free flow of clear CSF was obtained prior to injecting local anesthetic into the CSF. The needle was carefully withdrawn. The patient tolerated the procedure well.      

## 2019-03-12 NOTE — H&P (Signed)
TOTAL HIP ADMISSION H&P  Patient is admitted for left total hip arthroplasty.  Subjective:  Chief Complaint: left hip pain  HPI: Rachel Duran, 63 y.o. female, has a history of pain and functional disability in the left hip(s) due to arthritis and patient has failed non-surgical conservative treatments for greater than 12 weeks to include NSAID's and/or analgesics, corticosteriod injections, flexibility and strengthening excercises, use of assistive devices and activity modification.  Onset of symptoms was abrupt starting about 6 months ago with rapidlly worsening course since that time. She had a very hard mechanical fall on that hip in March of this year.  The patient noted no past surgery on the left hip(s).  Patient currently rates pain in the left hip at 10 out of 10 with activity. Patient has night pain, worsening of pain with activity and weight bearing, pain that interfers with activities of daily living and pain with passive range of motion. Patient has evidence of periarticular osteophytes, joint space narrowing and extensive subchondral edema in he femoral head and acetabulum by imaging studies. This condition presents safety issues increasing the risk of falls.  There is no current active infection.  Patient Active Problem List   Diagnosis Date Noted  . Unilateral primary osteoarthritis, left hip 01/28/2019  . Pelvic pain 12/12/2018  . Common bile duct (CBD) obstruction   . Elevated alkaline phosphatase level   . Venous insufficiency of left lower extremity 07/04/2018  . Dyslipidemia 09/22/2017  . DDD (degenerative disc disease), cervical 04/03/2017  . DDD (degenerative disc disease), lumbar 04/03/2017  . Onychomycosis 03/21/2017  . Dysphagia 10/18/2016  . Encounter for chronic pain management 10/18/2016  . Biliary stasis 09/26/2016  . Fever blister 08/18/2016  . Urinary urgency 07/05/2016  . HNP (herniated nucleus pulposus), lumbar 09/23/2015  . Sinus congestion  07/30/2015  . Iron deficiency 07/30/2015  . Health maintenance examination 06/15/2015  . 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 Headache 03/09/2015  . Clavicle enlargement 12/13/2014  . Prediabetes 12/13/2014  . Plantar fasciitis, bilateral 09/11/2014  . Advanced care planning/counseling discussion 06/10/2014  . Medicare annual wellness visit, subsequent 06/10/2014  . Abnormal thyroid function test 06/10/2014  . Chronic pain syndrome 06/10/2014  . CFS (chronic fatigue syndrome) 04/08/2014  . Calculus of bile duct 09/17/2013  . Postmenopausal atrophic vaginitis 10/19/2012  . 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 Cervical disc disorder with radiculopathy of cervical region 05/28/2010  . Anemia of chronic disease 03/17/2009  . CHEST PAIN UNSPECIFIED 02/28/2008  . APHTHOUS ULCERS 01/31/2008  . INSOMNIA, CHRONIC 11/09/2007  . Drug-induced constipation 10/11/2007  . Allergic rhinitis 04/12/2007  . MDD (major depressive disorder), recurrent episode, moderate ([place]) 03/05/2007  . [name] syndrome 03/05/2007  . GERD 03/05/2007  . ROSACEA 03/05/2007  . NEURALGIA 03/05/2007  . 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 CYSTITIS, CHRONIC INTERSTITIAL 12/06/2006  . Fibromyalgia 12/06/2006   Past Medical History:  Diagnosis Date  . Abdominal pain last 4 months   and nausea also  . Allergy   . Anemia    history of  . Anxiety   . Bipolar disorder ([place])    atpical bipolar disorder  . 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 C6- C7 -hx. past fusion(bone graft used)  . Cholecystitis   . Chronic pain   . DDD (degenerative disc disease), lumbar 09/2015   dextroscoliosis with multilevel DDD and facet arthrosis most notable for R foraminal disc protrusion L4/5 producing severe R neural foraminal stenosis abutting  R L4 nerve root, moderate spinal canal and mild lat recesss and R neural foraminal stenosis L3/4 (MRI)  . Depression    bipolar depression  .  Disorders of porphyrin metabolism   . Felon of finger of left hand 11/10/2016  . Fibromyalgia   . GERD (gastroesophageal reflux disease)   . Headache    occasionally  . Internal hemorrhoids   . Interstitial cystitis 06-06-12   hx.  . Irritable bowel syndrome   . PONV (postoperative nausea and vomiting)    now uses stomach blockers and no ponv  . 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   Evaluated in Pulmonary clinic/ [facility]/ [place] /  02/07/12 > referred to Health Dept 02/10/2012     - POS GOLD    01/31/2012    . Raynauds disease    hx.  . Seronegative arthritis    [name]  . 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 ANTERIOR CERVICAL DECOMP/DISCECTOMY FUSION  2004   C5/6, C6/7  . ANTERIOR CERVICAL DECOMP/DISCECTOMY FUSION  02/2016   C3/4, C4/5 with plating [name])  . AUGMENTATION MAMMAPLASTY Bilateral 03/25/2010  . BREAST ENHANCEMENT SURGERY  2010  . BREAST IMPLANT EXCHANGE  10/2014   exchange saline implants, B mastopexy/capsulorraphy ([facility])  . BUNIONECTOMY Bilateral yrs ago  . [place]   x 1  . CHOLECYSTECTOMY  06/11/2012   Procedure: LAPAROSCOPIC CHOLECYSTECTOMY WITH INTRAOPERATIVE CHOLANGIOGRAM;  Surgeon: [name], MD,FACS;  Location: [facility] ORS;  Service: General;  Laterality: N/A;  . COLONOSCOPY  02/2018   done for positive cologuard - WNL, rpt 10 yrs Fuller Plan)  . CYSTOSCOPY    . ERCP  05/22/2012   Procedure: ENDOSCOPIC RETROGRADE CHOLANGIOPANCREATOGRAPHY (ERCP);  Surgeon: [name], MD,FACG;  Location: [facility];  Service: Endoscopy;  Laterality: N/A;  . ERCP N/A 09/17/2013   Procedure: ENDOSCOPIC RETROGRADE CHOLANGIOPANCREATOGRAPHY (ERCP);  Surgeon: [name], MD;  Location: [facility];  Service: Endoscopy;  Laterality: N/A;  . ERCP N/A 09/27/2018   Procedure: ENDOSCOPIC RETROGRADE CHOLANGIOPANCREATOGRAPHY (ERCP);   Surgeon: [name], MD;  Location: [facility];  Service: Endoscopy;  Laterality: N/A;  . ESOPHAGOGASTRODUODENOSCOPY  09/2016   WNL. esophagus dilated [facility] Plan)  . HEMORRHOID BANDING  09-23-13   --Dr. Greer Pickerel  . HERNIA REPAIR     inguinal  . HYSTEROSCOPY W/ ENDOMETRIAL ABLATION    . NASAL SINUS SURGERY     x5  . REMOVAL OF STONES  09/27/2018   Procedure: REMOVAL OF STONES;  Surgeon: [name], MD;  Location: [facility];  Service: Endoscopy;;  . [name]  09/27/2018   Procedure: SPHINCTEROTOMY;  Surgeon: [name], MD;  Location: [facility];  Service: Endoscopy;;  . UPPER GASTROINTESTINAL ENDOSCOPY      No current facility-administered medications for this encounter.    Current Outpatient Medications  Medication Sig Dispense Refill Last Dose  . ALPRAZolam (XANAX) 1 MG tablet Take 0.5 mg by mouth at bedtime.   0   . amLODipine (NORVASC) 5 MG tablet Take 1 tablet (5 mg total) by mouth daily. Take 1 and a half tablet (7.5 mg) by mouth daily (Patient taking differently: Take 5 mg by mouth daily. ) 45 tablet 2   . ARIPiprazole (ABILIFY) 5 MG tablet Take 5 mg by mouth at bedtime.      . calcium-vitamin D (OSCAL WITH D) 500-200 MG-UNIT tablet Take 1 tablet by  mouth 2 (two) times daily.     . cyanocobalamin (,VITAMIN B-12,) 1000 MCG/ML injection INJECT 1 ML INTRAMUSCULARLY EVERY 21 DAYS (Patient taking differently: Inject 1,000 mcg into the muscle every 21 ( twenty-one) days. ) 3 mL 1   . diclofenac sodium (VOLTAREN) 1 % GEL Apply 2-4 grams to affected joint up to 4 times daily. (Patient taking differently: Apply 1 g topically 3 (three) times daily. ) 400 g 2   . esomeprazole (NEXIUM) 40 MG capsule TAKE 1 CAPSULE BY MOUTH TWICE DAILY BEFORE A MEAL (Patient taking differently: Take 40 mg by mouth 2 (two) times daily before a meal. ) 60 capsule 9   . Estradiol (YUVAFEM) 10 MCG TABS vaginal tablet Place 1 tablet (10 mcg total) vaginally 3 (three) times a week. Please keep  on file. (Patient taking differently: Place 1 tablet vaginally See admin instructions. Place 1 tablet vaginally Tuesday, Thursday and Saturday.) 12 tablet 10   . Eszopiclone 3 MG TABS TAKE 1 TABLET BY MOUTH AT BEDTIME TAKE IMMEDIATLEY BEFORE BEDTIME (Patient taking differently: Take 3 mg by mouth at bedtime. ) 90 tablet 0   . ferrous sulfate 325 (65 FE) MG tablet Take 325 mg by mouth every Monday, Wednesday, and Friday.      . fluticasone (FLONASE) 50 MCG/ACT nasal spray Place 2 sprays into both nostrils daily as needed (seasonal allergies). (Patient taking differently: Place 1 spray into both nostrils daily as needed for allergies. ) 16 g 5   . KADIAN 40 MG CP24 TAKE 1 CAPSULE TWICE DAILY (Patient taking differently: Take 40 mg by mouth 2 (two) times daily. ) 60 capsule 0   . lamoTRIgine (LAMICTAL) 200 MG tablet Take 100 mg by mouth daily.      Marland Kitchen linaclotide (LINZESS) 290 MCG CAPS capsule Take 290 mcg by mouth daily before breakfast.     . Multiple Vitamins-Minerals (PRESERVISION AREDS 2 PO) Take 1 capsule by mouth 2 (two) times daily.     . NONFORMULARY OR COMPOUNDED ITEM Testosterone propionate 2% in white petrolatum, apply small amount once daily for 5 days per week. (Patient taking differently: Apply 1 application topically See admin instructions. Testosterone propionate 2% in white petrolatum, Apply small amount topically at bedtime on Tuesday, Thursday and Saturday.) 60 each 2   . ondansetron (ZOFRAN ODT) 4 MG disintegrating tablet Take 1 tablet (4 mg total) by mouth every 8 (eight) hours as needed for nausea or vomiting. 20 tablet 1   . ondansetron (ZOFRAN) 4 MG tablet TAKE ONE TABLET EVERY 8 HOURS AS NEEDED FOR NAUSEA OR VOMITING (Patient taking differently: Take 4 mg by mouth every 8 (eight) hours as needed for nausea or vomiting. ) 30 tablet 0   . pentosan polysulfate (ELMIRON) 100 MG capsule Take 200 mg by mouth 2 (two) times daily.      . polyethylene glycol (MIRALAX) 17 g packet Take 17 g  by mouth 3 (three) times daily.      . pregabalin (LYRICA) 150 MG capsule TAKE 1 CAPSULE BY MOUTH TWICE DAILY (Patient taking differently: Take 150 mg by mouth 2 (two) times daily. ) 180 capsule 0   . senna (SENOKOT) 8.6 MG tablet Take 2 tablets by mouth at bedtime.     . SF 5000 PLUS 1.1 % CREA dental cream Place 1 application onto teeth at bedtime.      . traMADol (ULTRAM) 50 MG tablet TAKE ONE TABLET BY MOUTH TWICE DAILY AS NEEDED (Patient taking differently: Take 25 mg by  mouth 2 (two) times daily as needed for moderate pain. ) 40 tablet 0   . ursodiol (ACTIGALL) 300 MG capsule Take 1 capsule (300 mg total) by mouth 2 (two) times daily. 60 capsule 5   . amphetamine-dextroamphetamine (ADDERALL XR) 30 MG 24 hr capsule TAKE 1 CAPSULE BY MOUTH EVERY MORNING 90 capsule 0   . naloxegol oxalate (MOVANTIK) 12.5 MG TABS tablet Take 1 tablet (12.5 mg total) by mouth daily. 30 tablet 3    Allergies  Allergen Reactions  . Inh [Isoniazid] Other (See Comments)    Hepatitis  . Cymbalta [Duloxetine Hcl] Other (See Comments)    Urinary retention  . Nucynta [Tapentadol] Other (See Comments)    Agitation, jerking in legs  . Silenor [Doxepin Hcl] Other (See Comments)    nightmare, dizziness, stumbling upon walking, not effective  . Celebrex [Celecoxib] Other (See Comments)    Headaches  . Codeine Phosphate Nausea And Vomiting and Other (See Comments)    Severe n&v  . Lithium Other (See Comments)    Sores in mouth   . Meperidine Hcl Rash and Other (See Comments)    REACTION: red skin  . Sulfamethoxazole Rash    Social History   Tobacco Use  . Smoking status: Never Smoker  . Smokeless tobacco: Never Used  Substance Use Topics  . Alcohol use: No    Alcohol/week: 0.0 standard drinks    Family History  Problem Relation Age of Onset  . CAD Father 57       MI, nonsmoker  . Esophageal cancer Father 15  . Stomach cancer Father   . Scleroderma Mother   . Hypertension Mother   . Esophageal cancer  Paternal Grandfather   . Stomach cancer Paternal Grandfather   . Diabetes Maternal Grandmother   . Arthritis Brother   . Stroke Neg Hx   . Colon cancer Neg Hx   . Rectal cancer Neg Hx      Review of Systems  Musculoskeletal: Positive for back pain, joint pain and myalgias.  All other systems reviewed and are negative.   Objective:  Physical Exam  Constitutional: She is oriented to person, place, and time. She appears well-developed and well-nourished.  HENT:  Head: Normocephalic and atraumatic.  Eyes: Pupils are equal, round, and reactive to light. EOM are normal.  Neck: Normal range of motion. Neck supple.  Cardiovascular: Normal rate.  Respiratory: Effort normal.  GI: Soft.  Musculoskeletal:     Left hip: She exhibits decreased range of motion, decreased strength, tenderness and bony tenderness.  Neurological: She is alert and oriented to person, place, and time.  Skin: Skin is warm and dry.  Psychiatric: She has a normal mood and affect.    Vital signs in last 24 hours:    Labs:   Estimated body mass index is 21.25 kg/m as calculated from the following:   Height as of 03/04/19: 5\' 4"  (1.626 m).   Weight as of 03/04/19: 56.2 kg.   Imaging Review Plain radiographs demonstrate moderate degenerative joint disease of the left hip(s). The bone quality appears to be excellent for age and reported activity level. A MRI of the left hip shows much more significant arthritic changes in the left hip.      Assessment/Plan:  End stage arthritis, left hip(s)  The patient history, physical examination, clinical judgement of the provider and imaging studies are consistent with end stage degenerative joint disease of the left hip(s) and total hip arthroplasty is deemed medically necessary. The  treatment options including medical management, injection therapy, arthroscopy and arthroplasty were discussed at length. The risks and benefits of total hip arthroplasty were presented  and reviewed. The risks due to aseptic loosening, infection, stiffness, dislocation/subluxation,  thromboembolic complications and other imponderables were discussed.  The patient acknowledged the explanation, agreed to proceed with the plan and consent was signed. Patient is being admitted for inpatient treatment for surgery, pain control, PT, OT, prophylactic antibiotics, VTE prophylaxis, progressive ambulation and ADL's and discharge planning.The patient is planning to be discharged home with home health services    Patient's anticipated LOS is less than 2 midnights, meeting these requirements: - Younger than 49 - Lives within 1 hour of care - Has a competent adult at home to recover with post-op recover - NO history of  - Chronic pain requiring opiods  - Diabetes  - Coronary Artery Disease  - Heart failure  - Heart attack  - Stroke  - DVT/VTE  - Cardiac arrhythmia  - Respiratory Failure/COPD  - Renal failure  - Anemia  - Advanced Liver disease

## 2019-03-12 NOTE — Evaluation (Signed)
Physical Therapy Evaluation Patient Details Name: Rachel Duran MRN: [id number] DOB: 1956/01/24 Today's Date: 03/12/2019   History of Present Illness  Pt is a 63 y/o female s/p L THA, direct anterior. PMH includes bipolar disorder, anxiety, fibromyalgia, raynaud's disease, and s/p ACDF.   Clinical Impression  Pt s/p surgery above with deficits below. Pt limited this session secondary to reports of dizziness and increased pain. Required min guard to min A for mobility using RW. Educated about supine HEP. Will continue to follow acutely to maximize functional mobility independence and safety.     Follow Up Recommendations Follow surgeon's recommendation for DC plan and follow-up therapies;Supervision for mobility/OOB    Equipment Recommendations  Rolling walker with 5" wheels    Recommendations for Other Services       Precautions / Restrictions Precautions Precautions: None Restrictions Weight Bearing Restrictions: Yes LLE Weight Bearing: Weight bearing as tolerated      Mobility  Bed Mobility Overal bed mobility: Needs Assistance Bed Mobility: Supine to Sit     Supine to sit: Min assist     General bed mobility comments: Min A for LLE assist to come to sitting.   Transfers Overall transfer level: Needs assistance Equipment used: Rolling walker (2 wheeled) Transfers: Sit to/from Stand Sit to Stand: Min assist         General transfer comment: Min A for lift assist to stand. Cues for safe hand placement.   Ambulation/Gait Ambulation/Gait assistance: Min guard Gait Distance (Feet): 5 Feet Assistive device: Rolling walker (2 wheeled) Gait Pattern/deviations: Step-to pattern;Decreased step length - right;Decreased step length - left;Decreased weight shift to left;Antalgic Gait velocity: Decreased   General Gait Details: Short, very guarded steps. Cues for sequencing using RW. Distance limited to chair as pt reporting some dizziness and increased pain in LLE.    Stairs            Wheelchair Mobility    Modified Rankin (Stroke Patients Only)       Balance Overall balance assessment: Needs assistance Sitting-balance support: No upper extremity supported;Feet supported Sitting balance-Leahy Scale: Good     Standing balance support: Bilateral upper extremity supported;During functional activity Standing balance-Leahy Scale: Poor Standing balance comment: Reliant on BUE support                              Pertinent Vitals/Pain Pain Assessment: Faces Faces Pain Scale: Hurts even more Pain Location: L hip Pain Descriptors / Indicators: Aching;Operative site guarding Pain Intervention(s): Limited activity within patient's tolerance;Monitored during session;Repositioned    Home Living Family/patient expects to be discharged to:: Private residence Living Arrangements: Spouse/significant other Available Help at Discharge: Family Type of Home: House Home Access: Stairs to enter Entrance Stairs-Rails: None Entrance Stairs-Number of Steps: 1(threshold) Home Layout: Two level;Able to live on main level with bedroom/bathroom Home Equipment: Toilet riser      Prior Function Level of Independence: Independent               Hand Dominance        Extremity/Trunk Assessment   Upper Extremity Assessment Upper Extremity Assessment: Overall WFL for tasks assessed    Lower Extremity Assessment Lower Extremity Assessment: LLE deficits/detail LLE Deficits / Details: Deficits consistent with post op pain and weakness. Reporting some numbness in LLE.     Cervical / Trunk Assessment Cervical / Trunk Assessment: Normal  Communication   Communication: No difficulties  Cognition Arousal/Alertness: Awake/alert Behavior During  Therapy: WFL for tasks assessed/performed Overall Cognitive Status: Within Functional Limits for tasks assessed                                        General Comments       Exercises Total Joint Exercises Ankle Circles/Pumps: AROM;Both;20 reps;Supine Quad Sets: AROM;Left;10 reps;Supine   Assessment/Plan    PT Assessment Patient needs continued PT services  PT Problem List Decreased strength;Decreased range of motion;Decreased balance;Decreased activity tolerance;Decreased mobility;Decreased knowledge of use of DME;Pain       PT Treatment Interventions DME instruction;Gait training;Therapeutic activities;Therapeutic exercise;Functional mobility training;Stair training;Balance training;Patient/family education    PT Goals (Current goals can be found in the Care Plan section)  Acute Rehab PT Goals Patient Stated Goal: to go home PT Goal Formulation: With patient Time For Goal Achievement: 03/26/19 Potential to Achieve Goals: Good    Frequency 7X/week   Barriers to discharge        Co-evaluation               AM-PAC PT "6 Clicks" Mobility  Outcome Measure Help needed turning from your back to your side while in a flat bed without using bedrails?: A Little Help needed moving from lying on your back to sitting on the side of a flat bed without using bedrails?: A Little Help needed moving to and from a bed to a chair (including a wheelchair)?: A Little Help needed standing up from a chair using your arms (e.g., wheelchair or bedside chair)?: A Little Help needed to walk in hospital room?: A Little Help needed climbing 3-5 steps with a railing? : A Lot 6 Click Score: 17    End of Session Equipment Utilized During Treatment: Gait belt Activity Tolerance: Patient limited by pain Patient left: in chair;with call bell/phone within reach;with family/visitor present Nurse Communication: Mobility status PT Visit Diagnosis: Other abnormalities of gait and mobility (R26.89);Pain Pain - Right/Left: Left Pain - part of body: Hip    Time: 0737-1062 PT Time Calculation (min) (ACUTE ONLY): 23 min   Charges:   PT Evaluation $PT Eval Low Complexity:  1 Low PT Treatments $Therapeutic Activity: 8-22 mins        Leighton Ruff, PT, DPT  Acute Rehabilitation Services  Pager: [phone] Office: [phone]   Rudean Hitt 03/12/2019, 5:24 PM

## 2019-03-13 ENCOUNTER — Encounter (HOSPITAL_COMMUNITY): Payer: Self-pay | Admitting: Orthopaedic Surgery

## 2019-03-13 DIAGNOSIS — M160 Bilateral primary osteoarthritis of hip: Secondary | ICD-10-CM | POA: Diagnosis not present

## 2019-03-13 LAB — BASIC METABOLIC PANEL
Anion gap: 7 (ref 5–15)
BUN: 7 mg/dL — ABNORMAL LOW (ref 8–23)
CO2: 26 mmol/L (ref 22–32)
Calcium: 8.5 mg/dL — ABNORMAL LOW (ref 8.9–10.3)
Chloride: 105 mmol/L (ref 98–111)
Creatinine, Ser: 0.43 mg/dL — ABNORMAL LOW (ref 0.44–1.00)
GFR calc Af Amer: >60 mL/min (ref >60)
GFR calc non Af Amer: >60 mL/min (ref >60)
Glucose, Bld: 108 mg/dL — ABNORMAL HIGH (ref 70–99)
Potassium: 3.8 mmol/L (ref 3.5–5.1)
Sodium: 138 mmol/L (ref 135–145)

## 2019-03-13 LAB — CBC
HCT: 30 % — ABNORMAL LOW (ref 36.0–46.0)
Hemoglobin: 9.8 g/dL — ABNORMAL LOW (ref 12.0–15.0)
MCH: 28.2 pg (ref 26.0–34.0)
MCHC: 32.7 g/dL (ref 30.0–36.0)
MCV: 86.5 fL (ref 80.0–100.0)
Platelets: 236 10*3/uL (ref 150–400)
RBC: 3.47 MIL/uL — ABNORMAL LOW (ref 3.87–5.11)
RDW: 14.4 % (ref 11.5–15.5)
WBC: 7.8 10*3/uL (ref 4.0–10.5)
nRBC: 0 % (ref 0.0–0.2)

## 2019-03-13 MED ORDER — MORPHINE SULFATE ER 40 MG PO CP24
40.0000 mg | ORAL_CAPSULE | Freq: Two times a day (BID) | ORAL | Status: DC
  Administered 2019-03-13 (×2): 40 mg via ORAL
  Filled 2019-03-13 (×4): qty 1

## 2019-03-13 NOTE — TOC Initial Note (Signed)
Transition of Care [facility]) - Initial/Assessment Note    Patient Details  Name: Rachel Duran MRN: [id number] Date of Birth: May 30, 1956  Transition of Care [facility] CM/SW Contact:    Ninfa Meeker, RN Phone Number: [phone] (working remotely) 03/13/2019, 2:22 PM  Clinical Narrative: 63 yr old female s/p left total hip arthroplasty. Case manager spoke with patient via telephone to discuss [place] and DME needs at discharge. Patient was preoperatively setup with [facility], no changes. CM confirmed this with Joen Laura, [facility] Liaison. Pateint says she has a raised toilet seat and doesn't need 3in1, rolling walker was ordered through [facility]. Patient will have family support at discharge.                 Expected Discharge Plan: [place] Barriers to Discharge: No Barriers Identified   Patient Goals and CMS Choice Patient states their goals for this hospitalization and ongoing recovery are:: get better      Expected Discharge Plan and Services Expected Discharge Plan: [place] In-house Referral: NA Discharge Planning Services: CM Consult Post Acute Care Choice: Durable Medical Equipment, Home Health                   DME Arranged: Walker rolling DME Agency: [employer] Date DME Agency Contacted: 03/13/19 Time DME Agency Contacted: [id number] Representative spoke with at DME Agency: [place]: PT   Date [place]: 03/13/19 Time HH Agency Contacted: 1000(preoperatively setup by MD office) Representative spoke with at [place]: confirmed with Joen Laura  Prior Living Arrangements/Services     Patient language and need for interpreter reviewed:: No Do you feel safe going back to the place where you live?: Yes      Need for Family Participation in Patient Care: Yes (Comment) Care giver support system in place?: Yes (comment)   Criminal Activity/Legal Involvement Pertinent to Current  Situation/Hospitalization: No - Comment as needed  Activities of Daily Living Home Assistive Devices/Equipment: Eyeglasses, Contact lenses ADL Screening (condition at time of admission) Patient's cognitive ability adequate to safely complete daily activities?: Yes Is the patient deaf or have difficulty hearing?: No Does the patient have difficulty seeing, even when wearing glasses/contacts?: Yes Does the patient have difficulty concentrating, remembering, or making decisions?: No Patient able to express need for assistance with ADLs?: Yes Does the patient have difficulty dressing or bathing?: No Independently performs ADLs?: Yes (appropriate for developmental age) Does the patient have difficulty walking or climbing stairs?: Yes Weakness of Legs: Both Weakness of Arms/Hands: Both  Permission Sought/Granted Permission sought to share information with : Case Manager                Emotional Assessment   Attitude/Demeanor/Rapport: Gracious   Orientation: : Oriented to Self, Oriented to Place, Oriented to  Time, Oriented to Situation Alcohol / Substance Use: Not Applicable    Admission diagnosis:  LEFT HIP OSTEOARTHRITIS Patient Active Problem List   Diagnosis Date Noted  . 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  . Unilateral primary osteoarthritis, left hip 01/28/2019  . Pelvic pain 12/12/2018  . Common bile duct (CBD) obstruction   . Elevated alkaline phosphatase level   . Venous insufficiency of left lower extremity 07/04/2018  . Dyslipidemia 09/22/2017  . DDD (degenerative disc disease), cervical 04/03/2017  . DDD (degenerative disc disease), lumbar 04/03/2017  . Onychomycosis 03/21/2017  . Dysphagia 10/18/2016  . Encounter for chronic pain management  10/18/2016  . Biliary stasis 09/26/2016  . Fever blister 08/18/2016  . Urinary urgency 07/05/2016  . HNP (herniated nucleus pulposus), lumbar 09/23/2015  . Sinus congestion 07/30/2015  . Iron deficiency  07/30/2015  . Health maintenance examination 06/15/2015  . 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 Headache 03/09/2015  . Clavicle enlargement 12/13/2014  . Prediabetes 12/13/2014  . Plantar fasciitis, bilateral 09/11/2014  . Advanced care planning/counseling discussion 06/10/2014  . Medicare annual wellness visit, subsequent 06/10/2014  . Abnormal thyroid function test 06/10/2014  . Chronic pain syndrome 06/10/2014  . CFS (chronic fatigue syndrome) 04/08/2014  . Calculus of bile duct 09/17/2013  . Postmenopausal atrophic vaginitis 10/19/2012  . 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 Cervical disc disorder with radiculopathy of cervical region 05/28/2010  . Anemia of chronic disease 03/17/2009  . CHEST PAIN UNSPECIFIED 02/28/2008  . APHTHOUS ULCERS 01/31/2008  . INSOMNIA, CHRONIC 11/09/2007  . Drug-induced constipation 10/11/2007  . Allergic rhinitis 04/12/2007  . MDD (major depressive disorder), recurrent episode, moderate ([place]) 03/05/2007  . [name] syndrome 03/05/2007  . GERD 03/05/2007  . ROSACEA 03/05/2007  . NEURALGIA 03/05/2007  . 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 CYSTITIS, CHRONIC INTERSTITIAL 12/06/2006  . Fibromyalgia 12/06/2006   PCP:  Ria Bush, MD Pharmacy:   [place], [place] - [address] [place] [address] Phone: [phone] Fax: [phone]  [place], [place] [address] [place] [address] Phone: [phone] Fax: [phone]     Social Determinants of Health (SDOH) Interventions    Readmission Risk Interventions No flowsheet data found.

## 2019-03-13 NOTE — Anesthesia Postprocedure Evaluation (Signed)
Anesthesia Post Note  Patient: Rachel Duran  Procedure(s) Performed: LEFT TOTAL HIP ARTHROPLASTY ANTERIOR APPROACH (Left )     Patient location during evaluation: PACU Anesthesia Type: Spinal Level of consciousness: oriented and awake and alert Pain management: pain level controlled Vital Signs Assessment: post-procedure vital signs reviewed and stable Respiratory status: spontaneous breathing, respiratory function stable and nonlabored ventilation Cardiovascular status: blood pressure returned to baseline and stable Postop Assessment: no headache, no backache, no apparent nausea or vomiting and spinal receding Anesthetic complications: no    Last Vitals:  Vitals:   03/13/19 0638 03/13/19 0749  BP: 116/62 120/70  Pulse: 81 79  Resp: 20 18  Temp: 37.1 C 37 C  SpO2: 97% 96%    Last Pain:  Vitals:   03/13/19 1100  TempSrc:   PainSc: 7                  Rachel Duran Rachel Duran

## 2019-03-13 NOTE — Progress Notes (Signed)
Physical Therapy Treatment Patient Details Name: Rachel Duran MRN: [id number] DOB: [birth year] Today's Date: 03/13/2019    History of Present Illness Pt is a 63 y/o female s/p L THA, direct anterior. PMH includes bipolar disorder, anxiety, fibromyalgia, raynaud's disease, and s/p ACDF.     PT Comments    Pt performed gt training and functional mobility with supervision.  Plan for stair training in am.  Pt remains anxious but is responding and progressing well to treatment.     Follow Up Recommendations  Follow surgeon's recommendation for DC plan and follow-up therapies;Supervision for mobility/OOB     Equipment Recommendations  Rolling walker with 5" wheels    Recommendations for Other Services       Precautions / Restrictions Precautions Precautions: None Restrictions Weight Bearing Restrictions: Yes LLE Weight Bearing: Weight bearing as tolerated    Mobility  Bed Mobility Overal bed mobility: Needs Assistance Bed Mobility: Sit to Supine     Supine to sit: Supervision Sit to supine: Supervision   General bed mobility comments: Increased time and effort, guarded due to pain.  Transfers Overall transfer level: Needs assistance Equipment used: Rolling walker (2 wheeled) Transfers: Sit to/from Stand Sit to Stand: Supervision         General transfer comment: Cues for safe hand placement and forward weight shifting  Ambulation/Gait Ambulation/Gait assistance: Supervision Gait Distance (Feet): 320 Feet Assistive device: Rolling walker (2 wheeled) Gait Pattern/deviations: Step-to pattern;Decreased step length - right;Decreased step length - left;Decreased weight shift to left;Antalgic;Step-through pattern     General Gait Details: Continued cues to increase stride length and progress to step through pattern.  Pt tolerated well.   Stairs             Wheelchair Mobility    Modified Rankin (Stroke Patients Only)       Balance Overall balance  assessment: Needs assistance Sitting-balance support: No upper extremity supported;Feet supported Sitting balance-Leahy Scale: Good       Standing balance-Leahy Scale: Poor                              Cognition Arousal/Alertness: Awake/alert Behavior During Therapy: Anxious Overall Cognitive Status: Within Functional Limits for tasks assessed                                        Exercises Total Joint Exercises Ankle Circles/Pumps: AROM;Both;20 reps;Supine Quad Sets: AROM;Left;10 reps;Supine Heel Slides: AAROM;Left;10 reps;Supine Hip ABduction/ADduction: AAROM;Left;10 reps;Supine Long Arc Quad: AAROM;Left;10 reps;Seated    General Comments        Pertinent Vitals/Pain Pain Assessment: 0-10 Pain Score: 4  Pain Location: L hip Pain Descriptors / Indicators: Aching;Operative site guarding Pain Intervention(s): Monitored during session;Repositioned    Home Living                      Prior Function            PT Goals (current goals can now be found in the care plan section) Acute Rehab PT Goals Patient Stated Goal: to go home Potential to Achieve Goals: Good Progress towards PT goals: Progressing toward goals    Frequency    7X/week      PT Plan Current plan remains appropriate    Co-evaluation              AM-PAC  PT "6 Clicks" Mobility   Outcome Measure  Help needed turning from your back to your side while in a flat bed without using bedrails?: A Little Help needed moving from lying on your back to sitting on the side of a flat bed without using bedrails?: A Little Help needed moving to and from a bed to a chair (including a wheelchair)?: A Little Help needed standing up from a chair using your arms (e.g., wheelchair or bedside chair)?: A Little Help needed to walk in hospital room?: A Little Help needed climbing 3-5 steps with a railing? : A Little 6 Click Score: 18    End of Session Equipment Utilized  During Treatment: Gait belt Activity Tolerance: Patient limited by pain Patient left: in chair;with call bell/phone within reach;with family/visitor present Nurse Communication: Mobility status PT Visit Diagnosis: Other abnormalities of gait and mobility (R26.89);Pain Pain - Right/Left: Left Pain - part of body: Hip     Time: 9371-6967 PT Time Calculation (min) (ACUTE ONLY): 30 min  Charges:  $Gait Training: 8-22 mins $Therapeutic Exercise: 8-22 mins                     Governor Rooks, PTA Acute Rehabilitation Services Pager [phone] Office [phone]     Lil Lepage Eli Hose 03/13/2019, 3:50 PM

## 2019-03-13 NOTE — Progress Notes (Addendum)
Physical Therapy Treatment Patient Details Name: Rachel Duran MRN: [id number] DOB: [birth year] Today's Date: 03/13/2019    History of Present Illness Pt is a 63 y/o female s/p L THA, direct anterior. PMH includes bipolar disorder, anxiety, fibromyalgia, raynaud's disease, and s/p ACDF.     PT Comments    Pt performed gt training and functional mobility with anxiety limiting progress.  Pt continues to benefit from skilled rehab during hospitalization.  Will f/u for progression of mobility this afternoon.     Follow Up Recommendations  Follow surgeon's recommendation for DC plan and follow-up therapies;Supervision for mobility/OOB     Equipment Recommendations  Rolling walker with 5" wheels    Recommendations for Other Services       Precautions / Restrictions Precautions Precautions: None Restrictions Weight Bearing Restrictions: Yes LLE Weight Bearing: Weight bearing as tolerated    Mobility  Bed Mobility Overal bed mobility: Needs Assistance Bed Mobility: Supine to Sit     Supine to sit: Supervision     General bed mobility comments: Increased time and effort, guarded due to pain.  Transfers Overall transfer level: Needs assistance Equipment used: Rolling walker (2 wheeled) Transfers: Sit to/from Stand Sit to Stand: Min guard         General transfer comment: Cues for safe hand placement and forward weight shifting  Ambulation/Gait Ambulation/Gait assistance: Min guard Gait Distance (Feet): 220 Feet Assistive device: Rolling walker (2 wheeled) Gait Pattern/deviations: Step-to pattern;Decreased step length - right;Decreased step length - left;Decreased weight shift to left;Antalgic;Step-through pattern     General Gait Details: Cues to increase stride length and progress to step through pattern.  Pt tolerated well.   Stairs             Wheelchair Mobility    Modified Rankin (Stroke Patients Only)       Balance                                             Cognition Arousal/Alertness: Awake/alert Behavior During Therapy: Anxious Overall Cognitive Status: Within Functional Limits for tasks assessed                                        Exercises Total Joint Exercises Ankle Circles/Pumps: AROM;Both;20 reps;Supine Quad Sets: AROM;Left;10 reps;Supine Heel Slides: AAROM;Left;10 reps;Supine Hip ABduction/ADduction: AAROM;Left;10 reps;Supine Long Arc Quad: AAROM;Left;10 reps;Seated    General Comments        Pertinent Vitals/Pain Pain Assessment: 0-10 Pain Score: 7  Pain Location: L hip Pain Descriptors / Indicators: Aching;Operative site guarding Pain Intervention(s): Repositioned;Ice applied;Patient requesting pain meds-RN notified;RN gave pain meds during session    Home Living                      Prior Function            PT Goals (current goals can now be found in the care plan section) Acute Rehab PT Goals Patient Stated Goal: to go home Potential to Achieve Goals: Good Progress towards PT goals: Progressing toward goals    Frequency    7X/week      PT Plan Current plan remains appropriate    Co-evaluation              AM-PAC PT "6 Clicks"  Mobility   Outcome Measure  Help needed turning from your back to your side while in a flat bed without using bedrails?: A Little Help needed moving from lying on your back to sitting on the side of a flat bed without using bedrails?: A Little Help needed moving to and from a bed to a chair (including a wheelchair)?: A Little Help needed standing up from a chair using your arms (e.g., wheelchair or bedside chair)?: A Little Help needed to walk in hospital room?: A Little Help needed climbing 3-5 steps with a railing? : A Little 6 Click Score: 18    End of Session Equipment Utilized During Treatment: Gait belt Activity Tolerance: Patient limited by pain Patient left: in chair;with call bell/phone  within reach;with family/visitor present Nurse Communication: Mobility status PT Visit Diagnosis: Other abnormalities of gait and mobility (R26.89);Pain Pain - Right/Left: Left Pain - part of body: Hip     Time: 1478-2956 PT Time Calculation (min) (ACUTE ONLY): 31 min  Charges:  $Gait Training: 8-22 mins $Therapeutic Exercise: 8-22 mins                     Governor Rooks, PTA Acute Rehabilitation Services Pager [phone] Office [phone]     Rachel Duran Rachel Duran 03/13/2019, 12:56 PM

## 2019-03-13 NOTE — Progress Notes (Signed)
Foley removed, patient due to void by 12:35pm today. Day shift Nurse made aware.

## 2019-03-13 NOTE — Care Management CC44 (Signed)
Condition Code 44 Documentation Completed  Patient Details  Name: Emilia Kayes MRN: [id number] Date of Birth: 12-Mar-1956   Condition Code 44 given:  Yes Patient signature on Condition Code 44 notice:  Yes Documentation of 2 MD's agreement:  Yes Code 44 added to claim:  Yes    Ninfa Meeker, RN 03/13/2019, 8:57 AM

## 2019-03-13 NOTE — Plan of Care (Signed)
  Problem: Pain Managment: Goal: General experience of comfort will improve Outcome: Progressing   Problem: Safety: Goal: Ability to remain free from injury will improve Outcome: Progressing   

## 2019-03-13 NOTE — Progress Notes (Signed)
Patient ID: Rachel Duran, female   DOB: 01-15-1956, 63 y.o.   MRN: [id number] Just saw the patient at noon.  Has just finished one therapy session.  PT will work with her again this afternoon.  The patient does report slight unsteadiness.  She did go from a pre-op Hgb of 13 down to 9.8 post-op.  I plan to keep her here today and discharge her to home tomorrow after re-assessing her H/H in the am.

## 2019-03-13 NOTE — Progress Notes (Signed)
Subjective: 1 Day Post-Op Procedure(s) (LRB): LEFT TOTAL HIP ARTHROPLASTY ANTERIOR APPROACH (Left) Patient reports pain as moderate.  Has not been up with therapy.  Hgb down to 9.8 from 13 - acute blood loss anemia from her surgery; vitals stable thus far.  Objective: Vital signs in last 24 hours: Temp:  [97.6 F (36.4 C)-98.8 F (37.1 C)] 98.8 F (37.1 C) (08/19 5170) Pulse Rate:  [48-91] 81 (08/19 0638) Resp:  [6-20] 20 (08/19 0638) BP: (83-154)/(54-87) 116/62 (08/19 0638) SpO2:  [96 %-100 %] 97 % (08/19 0174) Weight:  [56.2 kg] 56.2 kg (08/18 1023)  Intake/Output from previous day: 08/18 0701 - 08/19 0700 In: 2470 [P.O.:845; I.V.:1575; IV Piggyback:50] Out: 9449 [Urine:1125; Blood:300] Intake/Output this shift: No intake/output data recorded.  Recent Labs    03/13/19 0424  HGB 9.8*   Recent Labs    03/13/19 0424  WBC 7.8  RBC 3.47*  HCT 30.0*  PLT 236   Recent Labs    03/13/19 0424  NA 138  K 3.8  CL 105  CO2 26  BUN 7*  CREATININE 0.43*  GLUCOSE 108*  CALCIUM 8.5*   No results for input(s): LABPT, INR in the last 72 hours.  Sensation intact distally Intact pulses distally Dorsiflexion/Plantar flexion intact Incision: scant drainage   Assessment/Plan: 1 Day Post-Op Procedure(s) (LRB): LEFT TOTAL HIP ARTHROPLASTY ANTERIOR APPROACH (Left) Up with therapy  Too early to determine if patient can go home this afternoon.  Thus far, I am not comfortable clinically with discharging her since she has not been up at all and since her H/H dropped.  Will need to see how she does.  I truly anticipate discharge tomorrow for patient safety purposes.      Mcarthur Rossetti 03/13/2019, 7:24 AM

## 2019-03-13 NOTE — Care Management CC44 (Signed)
Condition Code 44 Documentation Completed  Patient Details  Name: Zakeria Kulzer MRN: [id number] Date of Birth: 11-30-1955   Condition Code 44 given:  Yes Patient signature on Condition Code 44 notice:  Yes Documentation of 2 MD's agreement:  Yes Code 44 added to claim:  Yes    Ninfa Meeker, RN 03/13/2019, 9:14 AM    ** Case manager was unable to complete adding this CC44 to claim, UR specialist notifed.

## 2019-03-13 NOTE — Care Management Obs Status (Signed)
[facility] NOTIFICATION   Patient Details  Name: Rachel Duran MRN: [id number] Date of Birth: 05/11/1956   Medicare Observation Status Notification Given:  Yes    Ninfa Meeker, RN 03/13/2019, 8:57 AM

## 2019-03-14 ENCOUNTER — Encounter: Payer: Self-pay | Admitting: Orthopaedic Surgery

## 2019-03-14 DIAGNOSIS — M160 Bilateral primary osteoarthritis of hip: Secondary | ICD-10-CM | POA: Diagnosis not present

## 2019-03-14 LAB — CBC
HCT: 26.7 % — ABNORMAL LOW (ref 36.0–46.0)
Hemoglobin: 8.8 g/dL — ABNORMAL LOW (ref 12.0–15.0)
MCH: 28.3 pg (ref 26.0–34.0)
MCHC: 33 g/dL (ref 30.0–36.0)
MCV: 85.9 fL (ref 80.0–100.0)
Platelets: 190 10*3/uL (ref 150–400)
RBC: 3.11 MIL/uL — ABNORMAL LOW (ref 3.87–5.11)
RDW: 14.2 % (ref 11.5–15.5)
WBC: 7.3 10*3/uL (ref 4.0–10.5)
nRBC: 0 % (ref 0.0–0.2)

## 2019-03-14 MED ORDER — ASPIRIN 81 MG PO CHEW: 81.0000 mg | CHEWABLE_TABLET | Freq: Two times a day (BID) | ORAL | 0 refills | Status: DC

## 2019-03-14 MED ORDER — MORPHINE SULFATE ER 40 MG PO CP24
40.0000 mg | ORAL_CAPSULE | Freq: Two times a day (BID) | ORAL | Status: DC
  Administered 2019-03-14: 40 mg via ORAL

## 2019-03-14 MED ORDER — METHOCARBAMOL 500 MG PO TABS: 500.0000 mg | ORAL_TABLET | Freq: Four times a day (QID) | ORAL | 1 refills | Status: DC | PRN

## 2019-03-14 NOTE — Plan of Care (Signed)
  Problem: Pain Managment: Goal: General experience of comfort will improve Outcome: Progressing   Problem: Activity: Goal: Risk for activity intolerance will decrease Outcome: Progressing   

## 2019-03-14 NOTE — Progress Notes (Signed)
Subjective: 2 Days Post-Op Procedure(s) (LRB): LEFT TOTAL HIP ARTHROPLASTY ANTERIOR APPROACH (Left) Patient reports pain as moderate.  Ready for discharge just wanting to work with PT again prior to going home.   Objective: Vital signs in last 24 hours: Temp:  [98.2 F (36.8 C)-98.7 F (37.1 C)] 98.2 F (36.8 C) (08/20 0335) Pulse Rate:  [84-92] 84 (08/20 0335) Resp:  [16-18] 16 (08/20 0335) BP: (107-108)/(68-70) 107/68 (08/20 0335) SpO2:  [98 %-99 %] 98 % (08/20 0335)  Intake/Output from previous day: 08/19 0701 - 08/20 0700 In: 120 [P.O.:120] Out: -  Intake/Output this shift: No intake/output data recorded.  Recent Labs    03/13/19 0424 03/14/19 0413  HGB 9.8* 8.8*   Recent Labs    03/13/19 0424 03/14/19 0413  WBC 7.8 7.3  RBC 3.47* 3.11*  HCT 30.0* 26.7*  PLT 236 190   Recent Labs    03/13/19 0424  NA 138  K 3.8  CL 105  CO2 26  BUN 7*  CREATININE 0.43*  GLUCOSE 108*  CALCIUM 8.5*   No results for input(s): LABPT, INR in the last 72 hours.  Left lower extremity: Incision: dressing C/D/I Compartment soft   Assessment/Plan: 2 Days Post-Op Procedure(s) (LRB): LEFT TOTAL HIP ARTHROPLASTY ANTERIOR APPROACH (Left) Up with therapy Discharge home with home health    [place] 03/14/2019, 9:00 AM

## 2019-03-14 NOTE — Progress Notes (Signed)
Pt give discharge instructions and gone over with her and husband present. Pt verbalized understanding of instructions. Walker delivered to room. Pt given last dose of [place] to take home.

## 2019-03-14 NOTE — Discharge Instructions (Signed)

## 2019-03-14 NOTE — Progress Notes (Signed)
Physical Therapy Treatment Patient Details Name: Rachel Duran MRN: [id number] DOB: June 25, 1956 Today's Date: 03/14/2019    History of Present Illness Pt is a 63 y/o female s/p L THA, direct anterior. PMH includes bipolar disorder, anxiety, fibromyalgia, raynaud's disease, and s/p ACDF.     PT Comments    Pt performed gt training and functional mobility during session.  Pt progressed to stair training and standing exercises.  Assisted patient with lower body dressing.  She is ready for d/c home.    Follow Up Recommendations  Follow surgeon's recommendation for DC plan and follow-up therapies;Supervision for mobility/OOB     Equipment Recommendations  Rolling walker with 5" wheels    Recommendations for Other Services       Precautions / Restrictions Precautions Precautions: None Restrictions Weight Bearing Restrictions: Yes LLE Weight Bearing: Weight bearing as tolerated    Mobility  Bed Mobility               General bed mobility comments: Pt seated in recliner.  Transfers Overall transfer level: Needs assistance Equipment used: Rolling walker (2 wheeled) Transfers: Sit to/from Stand Sit to Stand: Supervision         General transfer comment: Cues for safe hand placement and forward weight shifting  Ambulation/Gait Ambulation/Gait assistance: Supervision Gait Distance (Feet): 220 Feet Assistive device: Rolling walker (2 wheeled) Gait Pattern/deviations: Step-to pattern;Decreased step length - right;Decreased step length - left;Decreased weight shift to left;Antalgic;Step-through pattern Gait velocity: Decreased   General Gait Details: Step to initially due to tightness.  Pt required cues to increased stride on R side and weight bearing on R side.   Stairs Stairs: Yes Stairs assistance: Supervision Stair Management: Two rails;Forwards Number of Stairs: 2 General stair comments: Cues for sequncing and RW placement.   Wheelchair Mobility     Modified Rankin (Stroke Patients Only)       Balance Overall balance assessment: Needs assistance Sitting-balance support: No upper extremity supported;Feet supported Sitting balance-Leahy Scale: Good     Standing balance support: Bilateral upper extremity supported;During functional activity Standing balance-Leahy Scale: Fair Standing balance comment: Reliant on BUE support                             Cognition Arousal/Alertness: Awake/alert Behavior During Therapy: WFL for tasks assessed/performed Overall Cognitive Status: Within Functional Limits for tasks assessed                                 General Comments: less anxious      Exercises Total Joint Exercises Hip ABduction/ADduction: Left;10 reps;AROM;Standing Knee Flexion: AROM;Left;10 reps;Standing Marching in Standing: AROM;Left;10 reps;Standing Standing Hip Extension: AROM;Left;10 reps;Standing    General Comments        Pertinent Vitals/Pain Pain Assessment: 0-10 Pain Score: 7  Pain Location: L hip Pain Descriptors / Indicators: Aching;Operative site guarding Pain Intervention(s): Monitored during session;Repositioned;Limited activity within patient's tolerance;Ice applied;Patient requesting pain meds-RN notified;RN gave pain meds during session    Home Living                      Prior Function            PT Goals (current goals can now be found in the care plan section) Acute Rehab PT Goals Patient Stated Goal: to go home Potential to Achieve Goals: Good Progress towards PT goals: Progressing toward goals  Frequency    7X/week      PT Plan Current plan remains appropriate    Co-evaluation              AM-PAC PT "6 Clicks" Mobility   Outcome Measure  Help needed turning from your back to your side while in a flat bed without using bedrails?: A Little Help needed moving from lying on your back to sitting on the side of a flat bed without  using bedrails?: A Little Help needed moving to and from a bed to a chair (including a wheelchair)?: A Little Help needed standing up from a chair using your arms (e.g., wheelchair or bedside chair)?: A Little Help needed to walk in hospital room?: A Little Help needed climbing 3-5 steps with a railing? : A Little 6 Click Score: 18    End of Session Equipment Utilized During Treatment: Gait belt Activity Tolerance: Patient limited by pain Patient left: in chair;with call bell/phone within reach;with family/visitor present Nurse Communication: Mobility status PT Visit Diagnosis: Other abnormalities of gait and mobility (R26.89);Pain Pain - Right/Left: Left Pain - part of body: Hip     Time: 2831-5176 PT Time Calculation (min) (ACUTE ONLY): 31 min  Charges:  $Gait Training: 8-22 mins $Therapeutic Exercise: 8-22 mins                     Governor Rooks, PTA Acute Rehabilitation Services Pager [phone] Office [phone]     Solomiya Pascale Eli Hose 03/14/2019, 11:27 AM

## 2019-03-14 NOTE — Discharge Summary (Signed)
Patient ID: Rachel Duran MRN: [id number] DOB/AGE: 61/24/1957 61 y.o.  Admit date: 03/12/2019 Discharge date: 03/14/2019  Admission Diagnoses:  Principal Problem:   Unilateral primary osteoarthritis, left hip Active Problems:   Status post total replacement of left hip   Status post total replacement of right hip   Discharge Diagnoses:  Same  Past Medical History:  Diagnosis Date  . Abdominal pain last 4 months   and nausea also  . Allergy   . Anemia    history of  . Anxiety   . Bipolar disorder ([place] [facility])    atpical bipolar disorder  . 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 C6- C7 -hx. past fusion(bone graft used)  . Cholecystitis   . Chronic pain   . DDD (degenerative disc disease), lumbar 09/2015   dextroscoliosis with multilevel DDD and facet arthrosis most notable for R foraminal disc protrusion L4/5 producing severe R neural foraminal stenosis abutting R L4 nerve root, moderate spinal canal and mild lat recesss and R neural foraminal stenosis L3/4 (MRI)  . Depression    bipolar depression  . Disorders of porphyrin metabolism   . Felon of finger of left hand 11/10/2016  . Fibromyalgia   . GERD (gastroesophageal reflux disease)   . Headache    occasionally  . Internal hemorrhoids   . Interstitial cystitis 06-06-12   hx.  . Irritable bowel syndrome   . PONV (postoperative nausea and vomiting)    now uses stomach blockers and no ponv  . 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 Raynauds disease    hx.  . Seronegative arthritis    [name]  . 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 female who was admitted 03/12/2019 for operative treatment ofUnilateral primary osteoarthritis, left hip. Patient has severe unremitting pain that affects sleep, daily activities, and work/hobbies. After pre-op clearance the patient was taken to the operating room on 03/12/2019 and underwent  Procedure(s): LEFT TOTAL HIP ARTHROPLASTY ANTERIOR APPROACH.    Patient was given perioperative antibiotics:  Anti-infectives (From admission, onward)   Start     Dose/Rate Route Frequency Ordered Stop   03/12/19 1800  ceFAZolin (ANCEF) IVPB 1 g/50 mL premix     1 g 100 mL/hr over 30 Minutes Intravenous Every 6 hours 03/12/19 1554 03/13/19 0017   03/12/19 1015  ceFAZolin (ANCEF) IVPB 2g/100 mL premix     2 g 200 mL/hr over 30 Minutes Intravenous On call to O.R. 03/12/19 1006 03/12/19 1214       Patient was given sequential compression devices, early ambulation, and chemoprophylaxis to prevent DVT.  Patient benefited maximally from hospital stay and there were no complications.    Recent vital signs:  Patient Vitals for the past 24 hrs:  BP Temp Temp src Pulse Resp SpO2  03/14/19 0335 107/68 98.2 F (36.8 C) Oral 84 16 98 %  03/13/19 1851 108/70 98.7 F (37.1 C) Oral 92 18 99 %     Recent laboratory studies:  Recent Labs    03/13/19 0424 03/14/19 0413  WBC 7.8 7.3  HGB 9.8* 8.8*  HCT  30.0* 26.7*  PLT 236 190  NA 138  --   K 3.8  --   CL 105  --   CO2 26  --   BUN 7*  --   CREATININE 0.43*  --   GLUCOSE 108*  --   CALCIUM 8.5*  --      Discharge Medications:   Allergies as of 03/14/2019      Reactions   Inh [isoniazid] Other (See Comments)   Hepatitis   Cymbalta [duloxetine Hcl] Other (See Comments)   Urinary retention   Nucynta [tapentadol] Other (See Comments)   Agitation, jerking in legs   Silenor [doxepin Hcl] Other (See Comments)   nightmare, dizziness, stumbling upon walking, not effective   Celebrex [celecoxib] Other (See Comments)   Headaches    Codeine Phosphate Nausea And Vomiting, Other (See Comments)   Severe n&v   Lithium Other (See Comments)   Sores in mouth   Meperidine Hcl Rash, Other (See Comments)   REACTION: red skin   Sulfamethoxazole Rash      Medication List    TAKE these medications   ALPRAZolam 1 MG tablet Commonly known as: XANAX Take 0.5 mg by mouth at bedtime.   amLODipine 5 MG tablet Commonly known as: NORVASC Take 1 tablet (5 mg total) by mouth daily. Take 1 and a half tablet (7.5 mg) by mouth daily What changed: additional instructions   amphetamine-dextroamphetamine 30 MG 24 hr capsule Commonly known as: ADDERALL XR TAKE 1 CAPSULE BY MOUTH EVERY MORNING   ARIPiprazole 5 MG tablet Commonly known as: ABILIFY Take 5 mg by mouth at bedtime.   aspirin 81 MG chewable tablet Chew 1 tablet (81 mg total) by mouth 2 (two) times daily.   calcium-vitamin D 500-200 MG-UNIT tablet Commonly known as: OSCAL WITH D Take 1 tablet by mouth 2 (two) times daily.   cyanocobalamin 1000 MCG/ML injection Commonly known as: (VITAMIN B-12) INJECT 1 ML INTRAMUSCULARLY EVERY 21 DAYS What changed: See the new instructions.   diclofenac sodium 1 % Gel Commonly known as: VOLTAREN Apply 2-4 grams to affected joint up to 4 times daily. What changed:   how much to take  how to take this  when to take this  additional instructions   esomeprazole 40 MG capsule Commonly known as: NEXIUM TAKE 1 CAPSULE BY MOUTH TWICE DAILY BEFORE A MEAL What changed: See the new instructions.   Estradiol 10 MCG Tabs vaginal tablet Commonly known as: Yuvafem Place 1 tablet (10 mcg total) vaginally 3 (three) times a week. Please keep on file. What changed:   when to take this  additional instructions   Eszopiclone 3 MG Tabs TAKE 1 TABLET BY MOUTH AT BEDTIME TAKE IMMEDIATLEY BEFORE BEDTIME What changed: See the new instructions.   ferrous sulfate 325 (65 FE) MG tablet Take 325 mg by mouth every Monday, Wednesday, and  Friday.   fluticasone 50 MCG/ACT nasal spray Commonly known as: FLONASE Place 2 sprays into both nostrils daily as needed (seasonal allergies). What changed:   how much to take  reasons to take this   Kadian 40 MG Cp24 Generic drug: Morphine Sulfate TAKE 1 CAPSULE TWICE DAILY What changed: how much to take   lamoTRIgine 200 MG tablet Commonly known as: LAMICTAL Take 100 mg by mouth daily.   Linzess 290 MCG Caps capsule Generic drug: linaclotide Take 290 mcg by mouth daily before breakfast.   methocarbamol 500 MG tablet Commonly known as: ROBAXIN Take 1 tablet (500 mg total)  by mouth every 6 (six) hours as needed for muscle spasms.   MiraLax 17 g packet Generic drug: polyethylene glycol Take 17 g by mouth 3 (three) times daily.   naloxegol oxalate 12.5 MG Tabs tablet Commonly known as: Movantik Take 1 tablet (12.5 mg total) by mouth daily.   NONFORMULARY OR COMPOUNDED ITEM Testosterone propionate 2% in white petrolatum, apply small amount once daily for 5 days per week. What changed:   how much to take  how to take this  when to take this  additional instructions   ondansetron 4 MG disintegrating tablet Commonly known as: Zofran ODT Take 1 tablet (4 mg total) by mouth every 8 (eight) hours as needed for nausea or vomiting.   ondansetron 4 MG tablet Commonly known as: ZOFRAN TAKE ONE TABLET EVERY 8 HOURS AS NEEDED FOR NAUSEA OR VOMITING What changed: See the new instructions.   pentosan polysulfate 100 MG capsule Commonly known as: ELMIRON Take 200 mg by mouth 2 (two) times daily.   pregabalin 150 MG capsule Commonly known as: LYRICA TAKE 1 CAPSULE BY MOUTH TWICE DAILY   PRESERVISION AREDS 2 PO Take 1 capsule by mouth 2 (two) times daily.   senna 8.6 MG tablet Commonly known as: SENOKOT Take 2 tablets by mouth at bedtime.   SF 5000 Plus 1.1 % Crea dental cream Generic drug: sodium fluoride Place 1 application onto teeth at bedtime.   traMADol  50 MG tablet Commonly known as: ULTRAM TAKE ONE TABLET BY MOUTH TWICE DAILY AS NEEDED What changed:   how much to take  reasons to take this   ursodiol 300 MG capsule Commonly known as: ACTIGALL Take 1 capsule (300 mg total) by mouth 2 (two) times daily.            Durable Medical Equipment  (From admission, onward)         Start     Ordered   03/13/19 1655  For home use only DME Walker rolling  Once    Question:  Patient needs a walker to treat with the following condition  Answer:  S/P total hip arthroplasty   03/13/19 1655          Diagnostic Studies: Dg Pelvis Portable  Result Date: 03/12/2019 CLINICAL DATA:  Status post hip replacement EXAM: PORTABLE PELVIS 1-2 VIEWS COMPARISON:  Dec 12, 2018 FINDINGS: The patient has undergone total hip arthroplasty on the left. There are expected postsurgical changes including subcutaneous gas and overlying soft tissue edema. The alignment appears near anatomic. There is no periprosthetic fracture. Phleboliths project over the patient's pelvis. There are degenerative changes of the right hip. IMPRESSION: Expected postsurgical changes related to total hip arthroplasty on the left. Electronically Signed   By: Constance Holster M.D.   On: 03/12/2019 14:52   Dg C-arm 1-60 Min  Result Date: 03/12/2019 CLINICAL DATA:  Left hip arthroplasty EXAM: OPERATIVE LEFT HIP WITH PELVIS; DG C-ARM 61-120 MIN COMPARISON:  None. FLUOROSCOPY TIME:  Radiation Exposure Index (as provided by the fluoroscopic device): 1.27 mGy If the device does not provide the exposure index: Fluoroscopy Time:  20 seconds Number of Acquired Images:  4 FINDINGS: Initial images demonstrate degenerative change of the left hip joint. Subsequent placement of the hip prosthesis is noted. IMPRESSION: Left hip replacement Electronically Signed   By: Inez Catalina M.D.   On: 03/12/2019 13:33   Dg Hip Operative Unilat W Or W/o Pelvis Left  Result Date: 03/12/2019 CLINICAL DATA:  Left  hip arthroplasty  EXAM: OPERATIVE LEFT HIP WITH PELVIS; DG C-ARM 61-120 MIN COMPARISON:  None. FLUOROSCOPY TIME:  Radiation Exposure Index (as provided by the fluoroscopic device): 1.27 mGy If the device does not provide the exposure index: Fluoroscopy Time:  20 seconds Number of Acquired Images:  4 FINDINGS: Initial images demonstrate degenerative change of the left hip joint. Subsequent placement of the hip prosthesis is noted. IMPRESSION: Left hip replacement Electronically Signed   By: Inez Catalina M.D.   On: 03/12/2019 13:33    Disposition: Discharge disposition: 01-Home or [place] Follow up.   Specialty: [place] Why: A representative from [facility] will contact you to arrange start date and time for your therapy. Contact information: [address] [place] [address] [phone]        Mcarthur Rossetti, MD. Schedule an appointment as soon as possible for a visit in 2 week(s).   Specialty: Orthopedic Surgery Contact information: [place] [address] [phone]            Signed: Erskine Emery 03/14/2019, 9:00 AM

## 2019-03-18 ENCOUNTER — Telehealth: Payer: Self-pay | Admitting: Orthopaedic Surgery

## 2019-03-18 NOTE — Telephone Encounter (Signed)
Cindy with [facility] called to request VO for [facility] PT for 1x a week for 1 week, 3x a week for 1 week and then 1x a week for 1 week.  CB#[phone].  Thank you.

## 2019-03-18 NOTE — Telephone Encounter (Signed)
Verbal order given  

## 2019-03-21 ENCOUNTER — Other Ambulatory Visit: Payer: Self-pay | Admitting: Family Medicine

## 2019-03-21 ENCOUNTER — Other Ambulatory Visit: Payer: Medicare Other

## 2019-03-22 ENCOUNTER — Encounter: Payer: Self-pay | Admitting: Family Medicine

## 2019-03-22 NOTE — Telephone Encounter (Signed)
Lunesta-last time filled on 01/02/2019 for #90 with 0 refill. Kadian-last time filled on 02/21/2019 for #60 with 0 refill.  LOV was 02/28/2019 and future appointment on 04/19/2019. Last Controlled Substance Agreement Date: 03/14/17 Last UDS: 03/14/17

## 2019-03-24 NOTE — Telephone Encounter (Signed)
[place] database reviewed.  rxs sent.  Routed to PCP as FYI.

## 2019-03-24 NOTE — Telephone Encounter (Signed)
Noted. Thanks.

## 2019-03-25 ENCOUNTER — Encounter: Payer: Self-pay | Admitting: Family Medicine

## 2019-03-25 DIAGNOSIS — G4700 Insomnia, unspecified: Secondary | ICD-10-CM

## 2019-03-25 NOTE — Telephone Encounter (Signed)
Received faxed PA request for eszopiclone 3 mg.  Sumbitted PA, key:  [id number].  Decision pending.

## 2019-03-26 ENCOUNTER — Encounter: Payer: Self-pay | Admitting: Orthopaedic Surgery

## 2019-03-26 ENCOUNTER — Ambulatory Visit (INDEPENDENT_AMBULATORY_CARE_PROVIDER_SITE_OTHER): Payer: Medicare Other | Admitting: Orthopaedic Surgery

## 2019-03-26 DIAGNOSIS — Z96642 Presence of left artificial hip joint: Secondary | ICD-10-CM

## 2019-03-26 NOTE — Progress Notes (Signed)
HPI: Mrs. Julson returns today 2 weeks status post left total hip arthroplasty.  She is overall doing very well.  She is ambulating with a cane.  She states that her preoperative pain is gone.  She has pain from the surgery especially in the incision.  She is asking to go to outpatient therapy to work on gait balance and strengthening she is finishing up with home therapy.  She is had no fever chills shortness of breath chest pain.  She is been taking aspirin as DVT prophylaxis has tolerated this well.  She is on no aspirin prior to surgery.  Physical exam: Left hip surgical incisions well approximated staples.  There is.  She developed a contact dermatitis due to the benzoin that was used at the time of surgery.  No signs of gross infection.  Left calf supple nontender.  Fluid range of motion of the left hip.  Dorsiflexion plantarflexion ankle on the left is intact.  Impression: Status post left total hip arthroplasty 2 weeks  Plan: She will work on scar tissue mobilization.  She will wash the wound with an antibacterial soap.  Staples were removed today Steri-Strips applied.  She will follow-up with [place] in 1 month sooner if she has any questions or concerns.  She is given prescription to get over to outpatient therapy to work on range of motion strengthening gait and balance.  Questions were encouraged and answered by Dr. [name] myself.  She will remain on 81 mg aspirin once daily for another week and then discontinue.

## 2019-03-27 ENCOUNTER — Encounter: Payer: Self-pay | Admitting: Family Medicine

## 2019-03-27 NOTE — Telephone Encounter (Signed)
She has already tried and failed belsomra and rozerem.

## 2019-03-27 NOTE — Telephone Encounter (Signed)
Received faxed PA denial stating the drug is not on the plan's Drug List.  She must first try Belsomra and Rozerem or the doctor needs to give specific medical reasons why these 2 covered drugs are not appropriate for the patient.

## 2019-03-28 NOTE — Telephone Encounter (Signed)
Spoke with [facility] to inform them that pt has tried [employer] and Rozerem.   Told I need to call [phone] to appeal denial.    Attempted to contact appeal dept and was on hold until line disconnected.  When trying to call back, busy signal.  Will try again tomorrow.

## 2019-03-29 NOTE — Telephone Encounter (Signed)
Attempted again today to speak with appeals.  Again on hold until call disconnected.  Will try again next week.

## 2019-04-02 ENCOUNTER — Ambulatory Visit: Payer: Self-pay | Admitting: Physician Assistant

## 2019-04-03 NOTE — Telephone Encounter (Signed)
Finally able to speak with someone who directed me to call [phone].       Called number above, only to be told to call back in 1 hr due to technical difficulties.

## 2019-04-05 NOTE — Telephone Encounter (Signed)
Spoke with Kennyth Lose who gave me [phone] to call.  After calling, spoke with Aldona Bar who states I need to call the provider number [phone].

## 2019-04-09 ENCOUNTER — Other Ambulatory Visit: Payer: Self-pay | Admitting: Family Medicine

## 2019-04-09 NOTE — Telephone Encounter (Signed)
Last office visit 02/28/2019 for discuss medication.  Zofran last refilled 01/21/2019 for #30 with no refills.  Vit B12 10/12/18 for 3 ml with 1 refill.  Last B12 level 06/21/2017 which was normal at 908 pg/ml.  Ok to refill?

## 2019-04-11 ENCOUNTER — Encounter: Payer: Self-pay | Admitting: Family Medicine

## 2019-04-11 NOTE — Telephone Encounter (Signed)
PA appeal letter has been faxed.  Decision can take up to 7 days.  Will notifiy pt via MyChart.

## 2019-04-15 ENCOUNTER — Other Ambulatory Visit: Payer: Self-pay | Admitting: Family Medicine

## 2019-04-15 NOTE — Telephone Encounter (Signed)
Last office visit 02/28/2019 for discuss medication.  Last refilled ? Listed as historical med.  Next Appt: 04/19/2019 for 3 month chronic pain follow up.  Ok to refill?

## 2019-04-16 NOTE — Progress Notes (Deleted)
Office Visit Note  Patient: Rachel Duran             Date of Birth: 1955/10/25           MRN: [id number]             PCP: Ria Bush, MD Referring: Ria Bush, MD Visit Date: 04/30/2019 Occupation: @GUAROCC @  Subjective:  No chief complaint on file.   History of Present Illness: [name] is a 63 y.o. female ***   Activities of Daily Living:  Patient reports morning stiffness for *** {minute/hour:19697}.   Patient {ACTIONS;DENIES/REPORTS:21021675::"Denies"} nocturnal pain.  Difficulty dressing/grooming: {ACTIONS;DENIES/REPORTS:21021675::"Denies"} Difficulty climbing stairs: {ACTIONS;DENIES/REPORTS:21021675::"Denies"} Difficulty getting out of chair: {ACTIONS;DENIES/REPORTS:21021675::"Denies"} Difficulty using hands for taps, buttons, cutlery, and/or writing: {ACTIONS;DENIES/REPORTS:21021675::"Denies"}  No Rheumatology ROS completed.   PMFS History:  Patient Active Problem List   Diagnosis Date Noted  . Status post total replacement of right hip 03/13/2019  . 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  . Unilateral primary osteoarthritis, left hip 01/28/2019  . Pelvic pain 12/12/2018  . Common bile duct (CBD) obstruction   . Elevated alkaline phosphatase level   . Venous insufficiency of left lower extremity 07/04/2018  . Dyslipidemia 09/22/2017  . DDD (degenerative disc disease), cervical 04/03/2017  . DDD (degenerative disc disease), lumbar 04/03/2017  . Onychomycosis 03/21/2017  . Dysphagia 10/18/2016  . Encounter for chronic pain management 10/18/2016  . Biliary stasis 09/26/2016  . Fever blister 08/18/2016  . Urinary urgency 07/05/2016  . HNP (herniated nucleus pulposus), lumbar 09/23/2015  . Sinus congestion 07/30/2015  . Iron deficiency 07/30/2015  . Health maintenance examination 06/15/2015  . 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 Headache 03/09/2015  . Clavicle enlargement 12/13/2014  . Prediabetes 12/13/2014  .  Plantar fasciitis, bilateral 09/11/2014  . Advanced care planning/counseling discussion 06/10/2014  . Medicare annual wellness visit, subsequent 06/10/2014  . Abnormal thyroid function test 06/10/2014  . Chronic pain syndrome 06/10/2014  . CFS (chronic fatigue syndrome) 04/08/2014  . Calculus of bile duct 09/17/2013  . Postmenopausal atrophic vaginitis 10/19/2012  . 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 Cervical disc disorder with radiculopathy of cervical region 05/28/2010  . Anemia of chronic disease 03/17/2009  . CHEST PAIN UNSPECIFIED 02/28/2008  . APHTHOUS ULCERS 01/31/2008  . INSOMNIA, CHRONIC 11/09/2007  . Drug-induced constipation 10/11/2007  . Allergic rhinitis 04/12/2007  . MDD (major depressive disorder), recurrent episode, moderate ([place]) 03/05/2007  . [name] syndrome 03/05/2007  . GERD 03/05/2007  . ROSACEA 03/05/2007  . NEURALGIA 03/05/2007  . 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 CYSTITIS, CHRONIC INTERSTITIAL 12/06/2006  . Fibromyalgia 12/06/2006    Past Medical History:  Diagnosis Date  . Abdominal pain last 4 months   and nausea also  . Allergy   . Anemia    history of  . Anxiety   . Bipolar disorder ([place])    atpical bipolar disorder  . 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 C6- C7 -hx. past fusion(bone graft used)  . Cholecystitis   . Chronic pain   . DDD (degenerative disc disease), lumbar 09/2015   dextroscoliosis with multilevel DDD and facet arthrosis most notable for R foraminal disc protrusion L4/5 producing severe R neural foraminal stenosis abutting R L4 nerve root, moderate spinal canal and mild lat recesss and R neural foraminal stenosis L3/4 (MRI)  . Depression    bipolar depression  . Disorders of porphyrin metabolism   . Felon of finger of left hand  11/10/2016  . Fibromyalgia   . GERD (gastroesophageal reflux disease)   . Headache    occasionally  . Internal hemorrhoids   . Interstitial  cystitis 06-06-12   hx.  . Irritable bowel syndrome   . PONV (postoperative nausea and vomiting)    now uses stomach blockers and no ponv  . 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   Evaluated in Pulmonary clinic/ [facility]/ [place] /  02/07/12 > referred to Health Dept 02/10/2012     - POS GOLD    01/31/2012    . Raynauds disease    hx.  . Seronegative arthritis    [name]  . 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 CAD Father 77       MI, nonsmoker  . Esophageal cancer Father 29  . Stomach cancer Father   . Scleroderma Mother   . Hypertension Mother   . Esophageal cancer Paternal Grandfather   . Stomach cancer Paternal Grandfather   . Diabetes Maternal Grandmother   . Arthritis Brother   . Stroke Neg Hx   . Colon cancer Neg Hx   . Rectal cancer Neg Hx    Past Surgical History:  Procedure Laterality Date  . ANTERIOR CERVICAL DECOMP/DISCECTOMY FUSION  2004   C5/6, C6/7  . ANTERIOR CERVICAL DECOMP/DISCECTOMY FUSION  02/2016   C3/4, C4/5 with plating [name])  . AUGMENTATION MAMMAPLASTY Bilateral 03/25/2010  . BREAST ENHANCEMENT SURGERY  2010  . BREAST IMPLANT EXCHANGE  10/2014   exchange saline implants, B mastopexy/capsulorraphy ([facility])  . BUNIONECTOMY Bilateral yrs ago  . [place]   x 1  . CHOLECYSTECTOMY  06/11/2012   Procedure: LAPAROSCOPIC CHOLECYSTECTOMY WITH INTRAOPERATIVE CHOLANGIOGRAM;  Surgeon: [name], MD,FACS;  Location: [facility];  Service: General;  Laterality: N/A;  . COLONOSCOPY  02/2018   done for positive cologuard - WNL, rpt 10 yrs Fuller Plan)  . CYSTOSCOPY    . ERCP  05/22/2012   Procedure: ENDOSCOPIC RETROGRADE CHOLANGIOPANCREATOGRAPHY (ERCP);  Surgeon: [name], MD,FACG;  Location: [facility];  Service: Endoscopy;  Laterality: N/A;  . ERCP N/A 09/17/2013   Procedure: ENDOSCOPIC RETROGRADE  CHOLANGIOPANCREATOGRAPHY (ERCP);  Surgeon: [name], MD;  Location: [facility];  Service: Endoscopy;  Laterality: N/A;  . ERCP N/A 09/27/2018   Procedure: ENDOSCOPIC RETROGRADE CHOLANGIOPANCREATOGRAPHY (ERCP);  Surgeon: [name], MD;  Location: [facility];  Service: Endoscopy;  Laterality: N/A;  . ESOPHAGOGASTRODUODENOSCOPY  09/2016   WNL. esophagus dilated Fuller Plan)  . HEMORRHOID BANDING  09-23-13   --Dr. Greer Pickerel  . HERNIA REPAIR     inguinal  . HYSTEROSCOPY W/ ENDOMETRIAL ABLATION    . NASAL SINUS SURGERY     x5  . REMOVAL OF STONES  09/27/2018   Procedure: REMOVAL OF STONES;  Surgeon: [name], MD;  Location: [facility];  Service: Endoscopy;;  . [name]  09/27/2018   Procedure: SPHINCTEROTOMY;  Surgeon: [name], MD;  Location: [facility];  Service: Endoscopy;;  . TOTAL HIP ARTHROPLASTY Left 03/12/2019   Procedure: LEFT TOTAL HIP ARTHROPLASTY ANTERIOR APPROACH;  [name], [name], MD)  . 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 Influenza Split 05/04/2011, 04/04/2012  . Influenza Whole 05/25/2007, 04/28/2008, 04/30/2009, 03/26/2010  . Influenza,inj,Quad PF,6+ Mos 04/16/2015, 04/15/2016, 05/16/2017, 04/10/2018  . Influenza-Unspecified 03/25/2014  . MMR 11/28/2017  . Pneumococcal Conjugate-13 08/02/2013  . Pneumococcal Polysaccharide-23 07/30/2009  . Td 11/01/2005, 11/10/2016  . Zoster Recombinat (Shingrix) 07/21/2017, 11/03/2017     Objective: Vital Signs: LMP 07/25/1998    Physical Exam   Musculoskeletal Exam: ***  CDAI Exam: CDAI  Score: - Patient Global: -; Provider Global: - Swollen: -; Tender: - Joint Exam   No joint exam has been documented for this visit   There is currently no information documented on the homunculus. Go to the Rheumatology activity and complete the homunculus joint exam.  Investigation: No additional findings.  Imaging: No results found.  Recent Labs: Lab Results  Component Value Date   WBC 7.3 03/14/2019   HGB 8.8 (L) 03/14/2019   PLT 190 03/14/2019   NA 138 03/13/2019   K 3.8 03/13/2019   CL 105 03/13/2019   CO2 26 03/13/2019   GLUCOSE 108 (H) 03/13/2019   BUN 7 (L) 03/13/2019   CREATININE 0.43 (L) 03/13/2019   BILITOT 0.4 09/25/2018   ALKPHOS 184 (H) 09/25/2018   AST 21 09/25/2018   ALT 22 09/25/2018   PROT 6.5 09/25/2018   ALBUMIN 4.3 09/25/2018   CALCIUM 8.5 (L) 03/13/2019   GFRAA >60 03/13/2019    Speciality Comments: No specialty comments available.  Procedures:  No procedures performed Allergies: Inh [isoniazid], Cymbalta [duloxetine hcl], Nucynta [tapentadol], Silenor [doxepin hcl], Celebrex [celecoxib], Codeine phosphate, Lithium, Meperidine hcl, and Sulfamethoxazole   Assessment / Plan:     Visit Diagnoses: No diagnosis found.  Orders: No orders of the defined types were placed in this encounter.  No orders of the defined types were placed in this encounter.   Face-to-face time spent with patient was *** minutes. Greater than 50% of time was spent in counseling and coordination of care.  Follow-Up Instructions: No follow-ups on file.   Earnestine Mealing, CMA  Note - This record has been created using Editor, commissioning.  Chart creation errors have been sought, but may not always  have been located. Such creation errors do not reflect on  the standard of medical care.

## 2019-04-19 ENCOUNTER — Telehealth: Payer: Self-pay

## 2019-04-19 ENCOUNTER — Encounter: Payer: Self-pay | Admitting: Family Medicine

## 2019-04-19 ENCOUNTER — Ambulatory Visit (INDEPENDENT_AMBULATORY_CARE_PROVIDER_SITE_OTHER): Payer: Medicare Other | Admitting: Family Medicine

## 2019-04-19 ENCOUNTER — Other Ambulatory Visit: Payer: Self-pay

## 2019-04-19 VITALS — BP 140/80 | HR 100 | Temp 97.5°F | Ht 64.5 in | Wt 123.2 lb

## 2019-04-19 DIAGNOSIS — D62 Acute posthemorrhagic anemia: Secondary | ICD-10-CM | POA: Diagnosis not present

## 2019-04-19 DIAGNOSIS — G894 Chronic pain syndrome: Secondary | ICD-10-CM | POA: Diagnosis not present

## 2019-04-19 DIAGNOSIS — G4700 Insomnia, unspecified: Secondary | ICD-10-CM

## 2019-04-19 DIAGNOSIS — K5903 Drug induced constipation: Secondary | ICD-10-CM

## 2019-04-19 DIAGNOSIS — M797 Fibromyalgia: Secondary | ICD-10-CM

## 2019-04-19 DIAGNOSIS — D638 Anemia in other chronic diseases classified elsewhere: Secondary | ICD-10-CM | POA: Diagnosis not present

## 2019-04-19 DIAGNOSIS — Z23 Encounter for immunization: Secondary | ICD-10-CM

## 2019-04-19 DIAGNOSIS — G8929 Other chronic pain: Secondary | ICD-10-CM | POA: Diagnosis not present

## 2019-04-19 DIAGNOSIS — E611 Iron deficiency: Secondary | ICD-10-CM

## 2019-04-19 DIAGNOSIS — Z96642 Presence of left artificial hip joint: Secondary | ICD-10-CM

## 2019-04-19 NOTE — Assessment & Plan Note (Signed)
PA filled out for lunesta this month.

## 2019-04-19 NOTE — Assessment & Plan Note (Signed)
Stable period on daily linzess and sennakot with PRN movantik

## 2019-04-19 NOTE — Patient Instructions (Addendum)
Labs today - we will be in touch with blood counts and plan for iron tablet.  Flu shot today Urine screen today.

## 2019-04-19 NOTE — Telephone Encounter (Signed)
PA/appeal for Lunesta coverage was approved by insurance for dates 03/25/2019-07/25/2019. Left detailed message (ok per DPR on file) letting patient know and also spoke with Amy at [place].

## 2019-04-19 NOTE — Progress Notes (Signed)
This visit was conducted in person.  BP 140/80   Pulse 100   Temp (!) 97.5 F (36.4 C)   Ht 5' 4.5" (1.638 m)   Wt 123 lb 3 oz (55.9 kg)   LMP 07/25/1998   SpO2 99%   BMI 20.82 kg/m    CC: chronic pain f/u Subjective:    Patient ID: Rachel Duran, female    DOB: 23-Feb-1956, 63 y.o.   MRN: [id number]  HPI: Rachel Duran is a 63 y.o. female presenting on 04/19/2019 for 3 months follow up (chronic pain management)   Recent L hip replacement (Dr [name]). Recovering remarkably well, only using cane now. Had allergic reaction to benzoin. Receiving outpatient PT through [facility] clinic. Iron MWF and aspirin QD restarted.    Chronic pain stemming from known cervical and lumbar DDD. Embeda embeda 80/3.2mg  no longer manufactured. MS contin did not provide any pain relief. Current pain regimen is Kadian ER 40mg  BID #60/mo. Also on tramadol #40 PRN. Tolerates medication well.   Also on adderall 30mg  daily QAM.   Chronic constipation - on movantik low dose PRN (high dose caused decreased narcotic effect). Also on linzess, senakot scheduled as well as miralax, MOM, benefiber.   Vision doing ok - sees eye doctor next month.      Relevant past medical, surgical, family and social history reviewed and updated as indicated. Interim medical history since our last visit reviewed. Allergies and medications reviewed and updated. Outpatient Medications Prior to Visit  Medication Sig Dispense Refill  . ALPRAZolam (XANAX) 1 MG tablet Take 0.5 mg by mouth at bedtime.   0  . amLODipine (NORVASC) 5 MG tablet Take 1 tablet (5 mg total) by mouth daily. Take 1 and a half tablet (7.5 mg) by mouth daily (Patient taking differently: Take 5 mg by mouth daily. ) 45 tablet 2  . amphetamine-dextroamphetamine (ADDERALL XR) 30 MG 24 hr capsule TAKE 1 CAPSULE BY MOUTH EVERY MORNING 90 capsule 0  . ARIPiprazole (ABILIFY) 5 MG tablet Take 5 mg by mouth at bedtime.     Marland Kitchen aspirin 81 MG chewable tablet  Chew 1 tablet (81 mg total) by mouth 2 (two) times daily. 35 tablet 0  . calcium-vitamin D (OSCAL WITH D) 500-200 MG-UNIT tablet Take 1 tablet by mouth 2 (two) times daily.    . cyanocobalamin (,VITAMIN B-12,) 1000 MCG/ML injection INJECT 1 ML IM EVERY 21 DAYS 1 mL 6  . diclofenac sodium (VOLTAREN) 1 % GEL Apply 2-4 grams to affected joint up to 4 times daily. (Patient taking differently: Apply 1 g topically 3 (three) times daily. ) 400 g 2  . esomeprazole (NEXIUM) 40 MG capsule TAKE 1 CAPSULE BY MOUTH TWICE DAILY BEFORE A MEAL (Patient taking differently: Take 40 mg by mouth 2 (two) times daily before a meal. ) 60 capsule 9  . Estradiol (YUVAFEM) 10 MCG TABS vaginal tablet Place 1 tablet (10 mcg total) vaginally 3 (three) times a week. Please keep on file. (Patient taking differently: Place 1 tablet vaginally See admin instructions. Place 1 tablet vaginally Tuesday, Thursday and Saturday.) 12 tablet 10  . Eszopiclone 3 MG TABS Take 1 tablet (3 mg total) by mouth at bedtime. 90 tablet 0  . ferrous sulfate 325 (65 FE) MG tablet Take 325 mg by mouth every Monday, Wednesday, and Friday.     . fluticasone (FLONASE) 50 MCG/ACT nasal spray Place 2 sprays into both nostrils daily as needed (seasonal allergies). (Patient taking differently: Place 1  spray into both nostrils daily as needed for allergies. ) 16 g 5  . KADIAN 40 MG CP24 TAKE 1 CAPSULE BY MOUTH TWO TIMES DAILY 60 capsule 0  . lamoTRIgine (LAMICTAL) 200 MG tablet Take 100 mg by mouth daily.     Marland Kitchen LINZESS 290 MCG CAPS capsule TAKE 1 CAPSULE BY MOUTH EVERY DAY 90 capsule 1  . methocarbamol (ROBAXIN) 500 MG tablet Take 1 tablet (500 mg total) by mouth every 6 (six) hours as needed for muscle spasms. 40 tablet 1  . Multiple Vitamins-Minerals (PRESERVISION AREDS 2 PO) Take 1 capsule by mouth 2 (two) times daily.    . naloxegol oxalate (MOVANTIK) 12.5 MG TABS tablet Take 1 tablet (12.5 mg total) by mouth daily. 30 tablet 3  . NONFORMULARY OR COMPOUNDED  ITEM Testosterone propionate 2% in white petrolatum, apply small amount once daily for 5 days per week. (Patient taking differently: Apply 1 application topically See admin instructions. Testosterone propionate 2% in white petrolatum, Apply small amount topically at bedtime on Tuesday, Thursday and Saturday.) 60 each 2  . ondansetron (ZOFRAN ODT) 4 MG disintegrating tablet Take 1 tablet (4 mg total) by mouth every 8 (eight) hours as needed for nausea or vomiting. 20 tablet 1  . ondansetron (ZOFRAN) 4 MG tablet TAKE ONE TABLET EVERY 8 HOURS AS NEEDED FOR NAUSEA OR VOMITING 30 tablet 1  . pentosan polysulfate (ELMIRON) 100 MG capsule Take 200 mg by mouth 2 (two) times daily.     . polyethylene glycol (MIRALAX) 17 g packet Take 17 g by mouth 3 (three) times daily.     . pregabalin (LYRICA) 150 MG capsule TAKE 1 CAPSULE BY MOUTH TWICE DAILY (Patient taking differently: Take 150 mg by mouth 2 (two) times daily. ) 180 capsule 0  . senna (SENOKOT) 8.6 MG tablet Take 2 tablets by mouth at bedtime.    . SF 5000 PLUS 1.1 % CREA dental cream Place 1 application onto teeth at bedtime.     . traMADol (ULTRAM) 50 MG tablet TAKE ONE TABLET BY MOUTH TWICE DAILY AS NEEDED (Patient taking differently: Take 25 mg by mouth 2 (two) times daily as needed for moderate pain. ) 40 tablet 0  . ursodiol (ACTIGALL) 300 MG capsule Take 1 capsule (300 mg total) by mouth 2 (two) times daily. 60 capsule 5   No facility-administered medications prior to visit.      Per HPI unless specifically indicated in ROS section below Review of Systems Objective:    BP 140/80   Pulse 100   Temp (!) 97.5 F (36.4 C)   Ht 5' 4.5" (1.638 m)   Wt 123 lb 3 oz (55.9 kg)   LMP 07/25/1998   SpO2 99%   BMI 20.82 kg/m   Wt Readings from Last 3 Encounters:  04/19/19 123 lb 3 oz (55.9 kg)  03/12/19 124 lb (56.2 kg)  03/04/19 123 lb 12.8 oz (56.2 kg)    Physical Exam Vitals signs and nursing note reviewed.  Constitutional:      General:  She is not in acute distress.    Appearance: She is not ill-appearing.  HENT:     Mouth/Throat:     Mouth: Mucous membranes are moist.     Pharynx: Oropharynx is clear. No posterior oropharyngeal erythema.  Eyes:     Extraocular Movements: Extraocular movements intact.     Pupils: Pupils are equal, round, and reactive to light.  Cardiovascular:     Rate and Rhythm: Normal rate and  regular rhythm.     Pulses: Normal pulses.     Heart sounds: Normal heart sounds. No murmur.  Pulmonary:     Effort: Pulmonary effort is normal. No respiratory distress.     Breath sounds: Normal breath sounds. No wheezing, rhonchi or rales.  Musculoskeletal:     Right lower leg: No edema.     Left lower leg: No edema.  Neurological:     Mental Status: She is alert.  Psychiatric:        Mood and Affect: Mood normal.        Behavior: Behavior normal.       Results for orders placed or performed during the hospital encounter of 03/12/19  CBC  Result Value Ref Range   WBC 7.8 4.0 - 10.5 K/uL   RBC 3.47 (L) 3.87 - 5.11 MIL/uL   Hemoglobin 9.8 (L) 12.0 - 15.0 g/dL   HCT 30.0 (L) 36.0 - 46.0 %   MCV 86.5 80.0 - 100.0 fL   MCH 28.2 26.0 - 34.0 pg   MCHC 32.7 30.0 - 36.0 g/dL   RDW 14.4 11.5 - 15.5 %   Platelets 236 150 - 400 K/uL   nRBC 0.0 0.0 - 0.2 %  Basic metabolic panel  Result Value Ref Range   Sodium 138 135 - 145 mmol/L   Potassium 3.8 3.5 - 5.1 mmol/L   Chloride 105 98 - 111 mmol/L   CO2 26 22 - 32 mmol/L   Glucose, Bld 108 (H) 70 - 99 mg/dL   BUN 7 (L) 8 - 23 mg/dL   Creatinine, Ser 0.43 (L) 0.44 - 1.00 mg/dL   Calcium 8.5 (L) 8.9 - 10.3 mg/dL   GFR calc non Af Amer >60 >60 mL/min   GFR calc Af Amer >60 >60 mL/min   Anion gap 7 5 - 15  CBC  Result Value Ref Range   WBC 7.3 4.0 - 10.5 K/uL   RBC 3.11 (L) 3.87 - 5.11 MIL/uL   Hemoglobin 8.8 (L) 12.0 - 15.0 g/dL   HCT 26.7 (L) 36.0 - 46.0 %   MCV 85.9 80.0 - 100.0 fL   MCH 28.3 26.0 - 34.0 pg   MCHC 33.0 30.0 - 36.0 g/dL   RDW  14.2 11.5 - 15.5 %   Platelets 190 150 - 400 K/uL   nRBC 0.0 0.0 - 0.2 %   Assessment & Plan:   Problem List Items Addressed This Visit    Status post total replacement of left hip   Postoperative anemia due to acute blood loss    Update CBC, ferritin. She is taking oral iron MWF      Relevant Orders   Ferritin   CBC with Differential/Platelet   Iron deficiency    Update iron stores.      INSOMNIA, CHRONIC    PA filled out for lunesta this month.       Fibromyalgia   Encounter for chronic pain management - Primary    [place] CSRS reviewed      Drug-induced constipation    Stable period on daily linzess and sennakot with PRN movantik      Chronic pain syndrome    Continue kadian 40mg  BID.       Relevant Orders   Pain Mgmt, Profile 8 w/Conf, U       No orders of the defined types were placed in this encounter.  Orders Placed This Encounter  Procedures  . Flu Vaccine QUAD 6+ mos PF IM (  Fluarix Quad PF)  . Ferritin  . CBC with Differential/Platelet  . Pain Mgmt, Profile 8 w/Conf, U    Order Specific Question:   Prescribed drugs 1:    Answer:   ADDERAL    Order Specific Question:   Prescribed drugs 2:    Answer:   KADIAN    Patient Instructions  Labs today - we will be in touch with blood counts and plan for iron tablet.  Flu shot today Urine screen today.    Follow up plan: Return in about 3 months (around 07/19/2019), or if symptoms worsen or fail to improve.  Ria Bush, MD

## 2019-04-19 NOTE — Assessment & Plan Note (Signed)
Update CBC, ferritin. She is taking oral iron MWF

## 2019-04-19 NOTE — Assessment & Plan Note (Addendum)
Update iron stores.

## 2019-04-19 NOTE — Assessment & Plan Note (Signed)
[place] CSRS reviewed  ?

## 2019-04-19 NOTE — Assessment & Plan Note (Signed)
Continue kadian 40mg  BID.

## 2019-04-21 LAB — CBC WITH DIFFERENTIAL/PLATELET
Absolute Monocytes: 531 cells/uL (ref 200–950)
Basophils Absolute: 58 cells/uL (ref 0–200)
Basophils Relative: 0.7 %
Eosinophils Absolute: 149 cells/uL (ref 15–500)
Eosinophils Relative: 1.8 %
HCT: 34.5 % — ABNORMAL LOW (ref 35.0–45.0)
Hemoglobin: 11.2 g/dL — ABNORMAL LOW (ref 11.7–15.5)
Lymphs Abs: 2523 cells/uL (ref 850–3900)
MCH: 27.9 pg (ref 27.0–33.0)
MCHC: 32.5 g/dL (ref 32.0–36.0)
MCV: 85.8 fL (ref 80.0–100.0)
MPV: 9.3 fL (ref 7.5–12.5)
Monocytes Relative: 6.4 %
Neutro Abs: 5038 cells/uL (ref 1500–7800)
Neutrophils Relative %: 60.7 %
Platelets: 337 10*3/uL (ref 140–400)
RBC: 4.02 10*6/uL (ref 3.80–5.10)
RDW: 13 % (ref 11.0–15.0)
Total Lymphocyte: 30.4 %
WBC: 8.3 10*3/uL (ref 3.8–10.8)

## 2019-04-21 LAB — PAIN MGMT, PROFILE 8 W/CONF, U
6 Acetylmorphine: NEGATIVE ng/mL
Alcohol Metabolites: NEGATIVE ng/mL (ref <500)
Alphahydroxyalprazolam: 30 ng/mL
Alphahydroxymidazolam: NEGATIVE ng/mL
Alphahydroxytriazolam: NEGATIVE ng/mL
Aminoclonazepam: NEGATIVE ng/mL
Amphetamine: 5930 ng/mL
Amphetamines: POSITIVE ng/mL
Benzodiazepines: POSITIVE ng/mL
Buprenorphine, Urine: NEGATIVE ng/mL
Cocaine Metabolite: NEGATIVE ng/mL
Codeine: NEGATIVE ng/mL
Creatinine: 33.6 mg/dL
Hydrocodone: NEGATIVE ng/mL
Hydromorphone: 141 ng/mL
Hydroxyethylflurazepam: NEGATIVE ng/mL
Lorazepam: NEGATIVE ng/mL
MDMA: NEGATIVE ng/mL
Marijuana Metabolite: NEGATIVE ng/mL
Methamphetamine: NEGATIVE ng/mL
Morphine: >10000 ng/mL
Nordiazepam: NEGATIVE ng/mL
Norhydrocodone: NEGATIVE ng/mL
Opiates: POSITIVE ng/mL
Oxazepam: NEGATIVE ng/mL
Oxidant: NEGATIVE ug/mL
Oxycodone: NEGATIVE ng/mL
Temazepam: NEGATIVE ng/mL
pH: 6.9 (ref 4.5–9.0)

## 2019-04-21 LAB — FERRITIN: Ferritin: 91 ng/mL (ref 16–288)

## 2019-04-24 ENCOUNTER — Other Ambulatory Visit: Payer: Self-pay | Admitting: Family Medicine

## 2019-04-24 ENCOUNTER — Encounter: Payer: Self-pay | Admitting: Orthopaedic Surgery

## 2019-04-24 ENCOUNTER — Ambulatory Visit (INDEPENDENT_AMBULATORY_CARE_PROVIDER_SITE_OTHER): Payer: Medicare Other | Admitting: Orthopaedic Surgery

## 2019-04-24 ENCOUNTER — Other Ambulatory Visit: Payer: Self-pay

## 2019-04-24 DIAGNOSIS — Z96642 Presence of left artificial hip joint: Secondary | ICD-10-CM

## 2019-04-24 NOTE — Progress Notes (Signed)
The patient is now 43 days status post a left total hip arthroplasty.  She says she is doing well great and she is walking without a cane.  She says range of motion and strength are increasing and stiffness is decreasing.  She reports is a little numbness around her incision.  She has no issues.  She is anxious to get back to walking her dogs.  On exam she walks without limp.  She has excellent range of motion of both her hips.  Her left operative hip move smoothly.  Her leg lengths are equal.  This point should continue creaser activities as comfort allows.  We will see her back in 6 months and that visit would like a low AP pelvis and a lateral of her left operative hip.

## 2019-04-24 NOTE — Telephone Encounter (Signed)
Name of Medication: Kadian Name of Pharmacy: [facility] or Written Date and Quantity: 03/25/19, #60 Last Office Visit and Type: 04/19/19, chronic pain f/u Next Office Visit and Type: 07/09/19, CPE prt 2 Last Controlled Substance Agreement Date: 03/14/17  Last UDS: 04/19/19

## 2019-04-25 ENCOUNTER — Encounter: Payer: Self-pay | Admitting: Family Medicine

## 2019-04-26 ENCOUNTER — Encounter: Payer: Self-pay | Admitting: Family Medicine

## 2019-04-26 NOTE — Telephone Encounter (Signed)
Best number [phone]  Pt called checking on rx for kadian  She will run out of med on Tuesday  And pharmacy needs 48 hours to order   They need rx today  Please call pt when this has been done

## 2019-04-26 NOTE — Telephone Encounter (Signed)
See other mychart message.

## 2019-04-26 NOTE — Telephone Encounter (Signed)
ERx Pt notified via mychart

## 2019-04-29 NOTE — Progress Notes (Signed)
Office Visit Note  Patient: Rachel Duran             Date of Birth: Sep 26, 1955           MRN: [id number]             PCP: Ria Bush, MD Referring: Ria Bush, MD Visit Date: 05/08/2019 Occupation: _0 @  Subjective:  Pain in both knee joints   History of Present Illness: Rachel Duran is a 63 y.o. female with history of fibromyalgia, osteoarthritis, DDD.  Patient states that since her follow-up visit she has had a left hip replacement by Dr. [name] about 6 weeks ago.  She states that she had a fall in March and injured her left hip.  She states that she tried having a cortisone injection performed in the left hip but did not notice much relief and decided to proceed with replacement.  She states that she performed home therapy and outpatient physical therapy and has started to walk on her own for exercise.  She will be following up with Dr. [name] in 6 months.  She states she continues to have right sided sciatica if she sits longer than about 20 minutes.  She is followed by Dr. [name].  She would like to hold off from having surgery anytime soon.  She states that her fibromyalgia has been flaring more since surgery.  She has generalized muscle aches and muscle tenderness.  She takes Lyrica as prescribed and takes Robaxin as needed for muscle spasms.  She continues to have insomnia and chronic fatigue.  She presents today with pain in bilateral knee joints.  She denies any joint swelling.  She has tried ice and taking Aleve for pain relief.  She would like bilateral cortisone injections today. She has been experiencing increased symptoms of raynaud's over the past 2 to 3 weeks.  She has increase her dose of Norvasc to 7.5 mg daily.  Activities of Daily Living:  Patient reports morning stiffness for 2 hour.   Patient Reports nocturnal pain.  Difficulty dressing/grooming: Reports Difficulty climbing stairs: Reports Difficulty getting out of chair: Reports  Difficulty using hands for taps, buttons, cutlery, and/or writing: Reports  Review of Systems  Constitutional: Positive for fatigue.  HENT: Positive for mouth dryness. Negative for mouth sores and nose dryness.   Eyes: Positive for dryness. Negative for pain and visual disturbance.  Respiratory: Negative for cough, hemoptysis, shortness of breath, wheezing and difficulty breathing.   Cardiovascular: Negative for chest pain, palpitations, hypertension and swelling in legs/feet.  Gastrointestinal: Positive for constipation. Negative for blood in stool and diarrhea.  Endocrine: Negative for excessive thirst and increased urination.  Genitourinary: Negative for difficulty urinating and painful urination.  Musculoskeletal: Positive for arthralgias, joint pain, morning stiffness and muscle tenderness. Negative for joint swelling, myalgias, muscle weakness and myalgias.  Skin: Negative for color change, pallor, rash, hair loss, nodules/bumps, redness, skin tightness, ulcers and sensitivity to sunlight.  Allergic/Immunologic: Negative for susceptible to infections.  Neurological: Negative for dizziness, numbness and weakness.  Hematological: Negative for swollen glands.  Psychiatric/Behavioral: Positive for sleep disturbance. Negative for depressed mood. The patient is not nervous/anxious.     PMFS History:  Patient Active Problem List   Diagnosis Date Noted  . 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  . Unilateral primary osteoarthritis, left hip 01/28/2019  . Pelvic pain 12/12/2018  . Common bile duct (CBD) obstruction   . Elevated alkaline phosphatase level   . Venous insufficiency of  left lower extremity 07/04/2018  . Dyslipidemia 09/22/2017  . DDD (degenerative disc disease), cervical 04/03/2017  . DDD (degenerative disc disease), lumbar 04/03/2017  . Onychomycosis 03/21/2017  . Dysphagia 10/18/2016  . Encounter for chronic pain management 10/18/2016  . Biliary stasis  09/26/2016  . Fever blister 08/18/2016  . Urinary urgency 07/05/2016  . HNP (herniated nucleus pulposus), lumbar 09/23/2015  . Sinus congestion 07/30/2015  . Iron deficiency 07/30/2015  . Health maintenance examination 06/15/2015  . 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 Headache 03/09/2015  . Clavicle enlargement 12/13/2014  . Prediabetes 12/13/2014  . Plantar fasciitis, bilateral 09/11/2014  . Advanced care planning/counseling discussion 06/10/2014  . Medicare annual wellness visit, subsequent 06/10/2014  . Abnormal thyroid function test 06/10/2014  . Chronic pain syndrome 06/10/2014  . CFS (chronic fatigue syndrome) 04/08/2014  . Calculus of bile duct 09/17/2013  . Postmenopausal atrophic vaginitis 10/19/2012  . 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 Cervical disc disorder with radiculopathy of cervical region 05/28/2010  . Postoperative anemia due to acute blood loss 03/17/2009  . CHEST PAIN UNSPECIFIED 02/28/2008  . APHTHOUS ULCERS 01/31/2008  . INSOMNIA, CHRONIC 11/09/2007  . Drug-induced constipation 10/11/2007  . Allergic rhinitis 04/12/2007  . MDD (major depressive disorder), recurrent episode, moderate ([place]) 03/05/2007  . [name] syndrome 03/05/2007  . GERD 03/05/2007  . ROSACEA 03/05/2007  . NEURALGIA 03/05/2007  . 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 CYSTITIS, CHRONIC INTERSTITIAL 12/06/2006  . Fibromyalgia 12/06/2006    Past Medical History:  Diagnosis Date  . Abdominal pain last 4 months   and nausea also  . Allergy   . Anemia    history of  . Anxiety   . Bipolar disorder ([place])    atpical bipolar disorder  . 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 C6- C7 -hx. past fusion(bone graft used)  . Cholecystitis   . Chronic pain   . DDD (degenerative disc disease), lumbar 09/2015   dextroscoliosis with multilevel DDD and facet arthrosis most notable for R foraminal disc protrusion L4/5 producing severe R neural foraminal  stenosis abutting R L4 nerve root, moderate spinal canal and mild lat recesss and R neural foraminal stenosis L3/4 (MRI)  . Depression    bipolar depression  . Disorders of porphyrin metabolism   . Felon of finger of left hand 11/10/2016  . Fibromyalgia   . GERD (gastroesophageal reflux disease)   . Headache    occasionally  . Internal hemorrhoids   . Interstitial cystitis 06-06-12   hx.  . Irritable bowel syndrome   . PONV (postoperative nausea and vomiting)    now uses stomach blockers and no ponv  . 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 Raynauds disease    hx.  . Seronegative arthritis    [name]  . 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 CAD Father 32       MI, nonsmoker  . Esophageal cancer Father 48  . Stomach cancer Father   . Scleroderma Mother   . Hypertension Mother   . Esophageal cancer Paternal Grandfather   . Stomach cancer Paternal Grandfather   . Diabetes Maternal Grandmother   . Arthritis Brother   . Stroke Neg  Hx   . Colon cancer Neg Hx   . Rectal cancer Neg Hx    Past Surgical History:  Procedure Laterality Date  . ANTERIOR CERVICAL DECOMP/DISCECTOMY FUSION  2004   C5/6, C6/7  . ANTERIOR CERVICAL DECOMP/DISCECTOMY FUSION  02/2016   C3/4, C4/5 with plating [name])  . AUGMENTATION MAMMAPLASTY Bilateral 03/25/2010  . BREAST ENHANCEMENT SURGERY  2010  . BREAST IMPLANT EXCHANGE  10/2014   exchange saline implants, B mastopexy/capsulorraphy ([facility])  . BUNIONECTOMY Bilateral yrs ago  . [place]   x 1  . CHOLECYSTECTOMY  06/11/2012   Procedure: LAPAROSCOPIC CHOLECYSTECTOMY WITH INTRAOPERATIVE CHOLANGIOGRAM;  Surgeon: [name], MD,FACS;  Location: [facility];  Service: General;   Laterality: N/A;  . COLONOSCOPY  02/2018   done for positive cologuard - WNL, rpt 10 yrs [facility])  . CYSTOSCOPY    . ERCP  05/22/2012   Procedure: ENDOSCOPIC RETROGRADE CHOLANGIOPANCREATOGRAPHY (ERCP);  Surgeon: [name], MD,FACG;  Location: [facility];  Service: Endoscopy;  Laterality: N/A;  . ERCP N/A 09/17/2013   Procedure: ENDOSCOPIC RETROGRADE CHOLANGIOPANCREATOGRAPHY (ERCP);  Surgeon: [name], MD;  Location: [facility];  Service: Endoscopy;  Laterality: N/A;  . ERCP N/A 09/27/2018   Procedure: ENDOSCOPIC RETROGRADE CHOLANGIOPANCREATOGRAPHY (ERCP);  Surgeon: [name], MD;  Location: [facility];  Service: Endoscopy;  Laterality: N/A;  . ESOPHAGOGASTRODUODENOSCOPY  09/2016   WNL. esophagus dilated [facility])  . HEMORRHOID BANDING  09-23-13   --Dr. Greer Pickerel  . HERNIA REPAIR     inguinal  . HYSTEROSCOPY W/ ENDOMETRIAL ABLATION    . NASAL SINUS SURGERY     x5  . REMOVAL OF STONES  09/27/2018   Procedure: REMOVAL OF STONES;  Surgeon: [name], MD;  Location: [facility];  Service: Endoscopy;;  . [name]  09/27/2018   Procedure: SPHINCTEROTOMY;  Surgeon: [name], MD;  Location: [facility];  Service: Endoscopy;;  . TOTAL HIP ARTHROPLASTY Left 03/12/2019   Procedure: LEFT TOTAL HIP ARTHROPLASTY ANTERIOR APPROACH;  [name], [name], MD)  . 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 Influenza Split 05/04/2011, 04/04/2012  . Influenza Whole 05/25/2007, 04/28/2008, 04/30/2009, 03/26/2010  . Influenza,inj,Quad PF,6+ Mos 04/16/2015, 04/15/2016,  05/16/2017, 04/10/2018, 04/19/2019  . Influenza-Unspecified 03/25/2014  . MMR 11/28/2017  . Pneumococcal Conjugate-13 08/02/2013  . Pneumococcal Polysaccharide-23 07/30/2009  . Td 11/01/2005, 11/10/2016  . Zoster Recombinat (Shingrix) 07/21/2017, 11/03/2017     Objective: Vital Signs: BP 123/74 (BP Location: Left Arm, Patient Position: Sitting, Cuff Size: Normal)   Pulse 91   Ht 5' 4.5" (1.638 m)   Wt 125 lb (56.7 kg)   LMP 07/25/1998   BMI 21.12 kg/m    Physical Exam Vitals signs and nursing note reviewed.  Constitutional:      Appearance: She is well-developed.  HENT:     Head: Normocephalic and atraumatic.  Eyes:     Conjunctiva/sclera: Conjunctivae normal.  Neck:     Musculoskeletal: Normal range of motion.  Cardiovascular:     Rate and Rhythm: Normal rate and  regular rhythm.     Heart sounds: Normal heart sounds.  Pulmonary:     Effort: Pulmonary effort is normal.     Breath sounds: Normal breath sounds.  Abdominal:     General: Bowel sounds are normal.     Palpations: Abdomen is soft.  Lymphadenopathy:     Cervical: No cervical adenopathy.  Skin:    General: Skin is warm and dry.     Capillary Refill: Capillary refill takes less than 2 seconds.  Neurological:     Mental Status: She is alert and oriented to person, place, and time.  Psychiatric:        Behavior: Behavior normal.      Musculoskeletal Exam: C-spine limited ROM.  Thoracic and lumbar spine good ROM.  Shoulder joints, elbow joints, wrist joints, MCPs, PIPs, and DIPs good ROM with no synovitis. PIP and DIP synovial thickening consistent with osteoarthritis of both hands.  Left hip joint limited ROM. Right hip joint full ROM.   Knee joints good ROM with no warmth or effusion.  Bilateral knee crepitus.  Ankle joints good ROM with no discomfort.  No tenderness or inflammation of ankle joints.   CDAI Exam: CDAI Score: - Patient Global: -; Provider Global: - Swollen: -; Tender: - Joint Exam   No  joint exam has been documented for this visit   There is currently no information documented on the homunculus. Go to the Rheumatology activity and complete the homunculus joint exam.  Investigation: No additional findings.  Imaging: No results found.  Recent Labs: Lab Results  Component Value Date   WBC 8.3 04/19/2019   HGB 11.2 (L) 04/19/2019   PLT 337 04/19/2019   NA 138 03/13/2019   K 3.8 03/13/2019   CL 105 03/13/2019   CO2 26 03/13/2019   GLUCOSE 108 (H) 03/13/2019   BUN 7 (L) 03/13/2019   CREATININE 0.43 (L) 03/13/2019   BILITOT 0.4 09/25/2018   ALKPHOS 184 (H) 09/25/2018   AST 21 09/25/2018   ALT 22 09/25/2018   PROT 6.5 09/25/2018   ALBUMIN 4.3 09/25/2018   CALCIUM 8.5 (L) 03/13/2019   GFRAA >60 03/13/2019    Speciality Comments: No specialty comments available.  Procedures:  Large Joint Inj: bilateral knee on 05/08/2019 12:06 PM Indications: pain Details: 27 G 1.5 in needle, medial approach  Arthrogram: No  Medications (Right): 1.5 mL lidocaine 1 %; 40 mg triamcinolone acetonide 40 MG/ML Aspirate (Right): 0 mL Medications (Left): 1.5 mL lidocaine 1 %; 40 mg triamcinolone acetonide 40 MG/ML Aspirate (Left): 0 mL Outcome: tolerated well, no immediate complications Procedure, treatment alternatives, risks and benefits explained, specific risks discussed. Consent was given by the patient. Immediately prior to procedure a time out was called to verify the correct patient, procedure, equipment, support staff and site/side marked as required. Patient was prepped and draped in the usual sterile fashion.     Allergies: Inh [isoniazid], Cymbalta [duloxetine hcl], Nucynta [tapentadol], Silenor [doxepin hcl], Benzoin, Celebrex [celecoxib], Codeine phosphate, Lithium, Meperidine hcl, and Sulfamethoxazole   Assessment / Plan:     Visit Diagnoses: Fibromyalgia: She has generalized hyperalgesia and positive tender points on exam.  She has been having more frequent  fibromyalgia flares since having surgery on 03/12/2019.  She has generalized muscle aches muscle tenderness.  She has trapezius muscle tension and muscle tenderness bilaterally.  She continues to take Lyrica as prescribed.  She takes Robaxin as needed for muscle spasms.  She continues to have chronic fatigue related to insomnia.  She has difficulty falling asleep as well as staying asleep.  Good sleep hygiene was discussed.  She has started walking again for exercise.   Chronic pain syndrome: She takes lyrica as prescribed.   CFS (chronic fatigue syndrome): She has chronic fatigue related to insomnia.   Primary osteoarthritis of both knees: She presents today with increased pain in bilateral knee joints.  She has good range of motion with no discomfort.  No warmth or effusion was noted.  She does have bilateral knee crepitus.  She requested bilateral cortisone injections today.  She tolerated the procedure well.  The procedure note was completed above.  She was encouraged use Voltaren gel topically as needed for pain relief.  She has started walking for exercise.  Chronic pain of both knees: She presents today with increased pain in bilateral knee joints.  She requested bilateral knee joint cortisone injections.  Procedure note was completed above.  Aftercare was discussed.  S/P hip replacement, left: She had a left hip replacement performed by Dr. [name] on 03/12/2019.  She has not had any complications and is progressing well.  She has performed home therapy as well as outpatient physical therapy with good results.  She is started walking for exercise.  She is no longer using a walker for assistance.  She will be following up with Dr. Domingo Madeira in 6 months.  Trochanteric bursitis of both hips: She continues to have intermittent symptoms of trochanteric bursitis bilaterally.  She has been performing stretching exercises on a regular basis.  [name] disease without gangrene: She has intermittent  symptoms of raynaud's.  She has been experiencing worsening symptoms over the past 2 to 3 weeks due to recent weather changes.  She increase her Norvasc from 5 mg to 7.5 mg by mouth daily which has improved her symptoms.  She was symptomatic today in the office.  She was encouraged to keep her core body temperature warm and wear gloves and thick socks.  We also discussed drinking warm fluids.  No digital ulcerations or signs of gangrene were noted.  She was advised to notify us if she develops new or worsening symptoms.  DDD (degenerative disc disease), cervical: Chronic pain.  She has limited ROM with discomfort.  She has no symptoms of radiculopathy at this time.   DDD (degenerative disc disease), lumbar: Chronic pain.  She has been experiencing intermittent symptoms of right-sided sciatica.  She states that if she sits for longer than about 20 minutes to experience symptoms of sciatica.  She is followed by Dr. [name].  She does not want to proceed with surgery at this time.  She was encouraged to try to change positions frequently perform stretching exercises on a regular basis.  Trigger finger, left middle finger: She is experiencing tenderness and locking.  We discussed conservative treatment options including using a splint or buddy tape as well as applying Voltaren gel topically as needed.  She was advised to notify us if her symptoms persist or worsen and she can return for an ultrasound-guided cortisone injection.  Plantar fasciitis, bilateral: Resolved.   Other medical conditions are listed as follows:   H/O oral aphthous ulcers  Prediabetes  MDD (major depressive disorder), recurrent episode, moderate ([facility])  History of gastroesophageal reflux (GERD)  History of bipolar disorder  Hx of porphyria  Positive QuantiFERON-TB Gold test  Orders: Orders Placed This Encounter  Procedures  . Large Joint Inj   Meds ordered this encounter  Medications  . diclofenac sodium (VOLTAREN) 1  %  GEL    Sig: Apply 2-4 grams to affected joint up to 4 times daily.    Dispense:  400 g    Refill:  4    Face-to-face time spent with patient was 30 minutes. Greater than 50% of time was spent in counseling and coordination of care.  Follow-Up Instructions: Return in about 6 months (around 11/06/2019) for Fibromyalgia.   Ofilia Neas, PA-C   I examined and evaluated the patient with Hazel Sams PA.  Patient has been experiencing some discomfort in her joints.  She has been having a lot of discomfort in her knee joints.  Per her request after informed consent was obtained bilateral knee joints were injected with cortisone as described above.  She has been also having some [name] symptoms.  For improving and keeping core temperature warm was also discussed.  The plan of care was discussed as noted above.  Bo Merino, MD  Note - This record has been created using Editor, commissioning.  Chart creation errors have been sought, but may not always  have been located. Such creation errors do not reflect on  the standard of medical care.

## 2019-04-30 ENCOUNTER — Ambulatory Visit: Payer: Medicare Other | Admitting: Rheumatology

## 2019-05-08 ENCOUNTER — Other Ambulatory Visit: Payer: Self-pay

## 2019-05-08 ENCOUNTER — Ambulatory Visit (INDEPENDENT_AMBULATORY_CARE_PROVIDER_SITE_OTHER): Payer: Medicare Other | Admitting: Rheumatology

## 2019-05-08 ENCOUNTER — Encounter: Payer: Self-pay | Admitting: Rheumatology

## 2019-05-08 VITALS — BP 123/74 | HR 91 | Ht 64.5 in | Wt 125.0 lb

## 2019-05-08 DIAGNOSIS — Z96642 Presence of left artificial hip joint: Secondary | ICD-10-CM

## 2019-05-08 DIAGNOSIS — R7303 Prediabetes: Secondary | ICD-10-CM

## 2019-05-08 DIAGNOSIS — M722 Plantar fascial fibromatosis: Secondary | ICD-10-CM

## 2019-05-08 DIAGNOSIS — G894 Chronic pain syndrome: Secondary | ICD-10-CM | POA: Diagnosis not present

## 2019-05-08 DIAGNOSIS — I7300 Raynaud's syndrome without gangrene: Secondary | ICD-10-CM

## 2019-05-08 DIAGNOSIS — M797 Fibromyalgia: Secondary | ICD-10-CM

## 2019-05-08 DIAGNOSIS — M7061 Trochanteric bursitis, right hip: Secondary | ICD-10-CM

## 2019-05-08 DIAGNOSIS — G9332 Myalgic encephalomyelitis/chronic fatigue syndrome: Secondary | ICD-10-CM

## 2019-05-08 DIAGNOSIS — F331 Major depressive disorder, recurrent, moderate: Secondary | ICD-10-CM

## 2019-05-08 DIAGNOSIS — R5382 Chronic fatigue, unspecified: Secondary | ICD-10-CM

## 2019-05-08 DIAGNOSIS — M170 Bilateral primary osteoarthritis of knee: Secondary | ICD-10-CM

## 2019-05-08 DIAGNOSIS — Z8639 Personal history of other endocrine, nutritional and metabolic disease: Secondary | ICD-10-CM

## 2019-05-08 DIAGNOSIS — M25562 Pain in left knee: Secondary | ICD-10-CM

## 2019-05-08 DIAGNOSIS — M7062 Trochanteric bursitis, left hip: Secondary | ICD-10-CM

## 2019-05-08 DIAGNOSIS — R7612 Nonspecific reaction to cell mediated immunity measurement of gamma interferon antigen response without active tuberculosis: Secondary | ICD-10-CM

## 2019-05-08 DIAGNOSIS — M5030 Other cervical disc degeneration, unspecified cervical region: Secondary | ICD-10-CM

## 2019-05-08 DIAGNOSIS — M25561 Pain in right knee: Secondary | ICD-10-CM

## 2019-05-08 DIAGNOSIS — G8929 Other chronic pain: Secondary | ICD-10-CM

## 2019-05-08 DIAGNOSIS — M5136 Other intervertebral disc degeneration, lumbar region: Secondary | ICD-10-CM

## 2019-05-08 DIAGNOSIS — Z8659 Personal history of other mental and behavioral disorders: Secondary | ICD-10-CM

## 2019-05-08 DIAGNOSIS — M65332 Trigger finger, left middle finger: Secondary | ICD-10-CM

## 2019-05-08 DIAGNOSIS — Z8719 Personal history of other diseases of the digestive system: Secondary | ICD-10-CM

## 2019-05-08 MED ORDER — DICLOFENAC SODIUM 1 % TD GEL: TRANSDERMAL | 4 refills | Status: DC

## 2019-05-10 ENCOUNTER — Encounter: Payer: Self-pay | Admitting: Orthopaedic Surgery

## 2019-05-13 ENCOUNTER — Other Ambulatory Visit: Payer: Self-pay | Admitting: Orthopaedic Surgery

## 2019-05-13 ENCOUNTER — Encounter: Payer: Self-pay | Admitting: Orthopaedic Surgery

## 2019-05-14 ENCOUNTER — Encounter: Payer: Self-pay | Admitting: Rheumatology

## 2019-05-14 NOTE — Telephone Encounter (Signed)
Please to schedule an appointment to discuss further treatment options.

## 2019-05-15 NOTE — Progress Notes (Signed)
Office Visit Note  Patient: Rachel Duran             Date of Birth: November 07, 1955           MRN: [id number]             PCP: Ria Bush, MD Referring: Ria Bush, MD Visit Date: 05/17/2019 Occupation: @GUAROCC @  Subjective:  [name]   History of Present Illness: Mliss Wedin is a 63 y.o. female with history of fibromyalgia, osteoarthritis, and [name].  She presents today to discuss worsening symptoms of [name].  She states earlier this week she experienced discoloration of the right 5th digit that lasted 72 hours without any relief.  She reports pain and numbness of the 5th digit during that time.  She denies any digital ulcerations.  She tried drinking warm liquids, using a heating pad, and an electric blanket. She has also been applying nitroglycerin ointment once daily.  She cannot tolerate using it more frequently due to experiencing headaches, nausea, and hot flashes.  She continues to take norvasc 7.5 mg po daily, which she recently increased due to cooler temperatures outside.  She also is taking aspirin 81 mg po daily.  She has tried wearing gloves and socks as well.   She states both knee joints feel better after the cortisone injections on 05/08/19.  She denies any trochanteric bursa discomfort at this time.  Her left hip replacement is doing well.    Activities of Daily Living:  Patient reports morning stiffness for 2-2.5 hours.   Patient Reports nocturnal pain.  Difficulty dressing/grooming: Denies Difficulty climbing stairs: Reports Difficulty getting out of chair: Reports Difficulty using hands for taps, buttons, cutlery, and/or writing: Reports  Review of Systems  Constitutional: Positive for fatigue.  HENT: Positive for mouth dryness. Negative for mouth sores and nose dryness.   Eyes: Positive for dryness. Negative for pain and visual disturbance.  Respiratory: Negative for cough, hemoptysis, shortness of breath and difficulty  breathing.   Cardiovascular: Negative for chest pain, palpitations, hypertension and swelling in legs/feet.  Gastrointestinal: Positive for constipation. Negative for blood in stool and diarrhea.  Endocrine: Negative for increased urination.  Genitourinary: Negative for painful urination.  Musculoskeletal: Positive for arthralgias, joint pain and morning stiffness. Negative for joint swelling, myalgias, muscle weakness, muscle tenderness and myalgias.  Skin: Positive for color change. Negative for pallor, rash, hair loss, nodules/bumps, skin tightness, ulcers and sensitivity to sunlight.  Allergic/Immunologic: Negative for susceptible to infections.  Neurological: Negative for dizziness, numbness, headaches and weakness.  Hematological: Negative for swollen glands.  Psychiatric/Behavioral: Positive for sleep disturbance. Negative for depressed mood. The patient is nervous/anxious.     PMFS History:  Patient Active Problem List   Diagnosis Date Noted   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   Unilateral primary osteoarthritis, left hip 01/28/2019   Pelvic pain 12/12/2018   Common bile duct (CBD) obstruction    Elevated alkaline phosphatase level    Venous insufficiency of left lower extremity 07/04/2018   Dyslipidemia 09/22/2017   DDD (degenerative disc disease), cervical 04/03/2017   DDD (degenerative disc disease), lumbar 04/03/2017   Onychomycosis 03/21/2017   Dysphagia 10/18/2016   Encounter for chronic pain management 10/18/2016   Biliary stasis 09/26/2016   Fever blister 08/18/2016   Urinary urgency 07/05/2016   HNP (herniated nucleus pulposus), lumbar 09/23/2015   Sinus congestion 07/30/2015   Iron deficiency 07/30/2015   Health maintenance examination 06/15/2015   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   Headache  03/09/2015   Clavicle enlargement 12/13/2014   Prediabetes 12/13/2014   Plantar fasciitis, bilateral 09/11/2014   Advanced care  planning/counseling discussion 06/10/2014   Medicare annual wellness visit, subsequent 06/10/2014   Abnormal thyroid function test 06/10/2014   Chronic pain syndrome 06/10/2014   CFS (chronic fatigue syndrome) 04/08/2014   Calculus of bile duct 09/17/2013   Postmenopausal atrophic vaginitis 10/19/2012   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   Cervical disc disorder with radiculopathy of cervical region 05/28/2010   Postoperative anemia due to acute blood loss 03/17/2009   CHEST PAIN UNSPECIFIED 02/28/2008   APHTHOUS ULCERS 01/31/2008   INSOMNIA, CHRONIC 11/09/2007   Drug-induced constipation 10/11/2007   Allergic rhinitis 04/12/2007   MDD (major depressive disorder), recurrent episode, moderate ([place]) 03/05/2007   [name] syndrome 03/05/2007   GERD 03/05/2007   ROSACEA 03/05/2007   NEURALGIA 03/05/2007   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 C6- C7 -hx. past fusion(bone graft used)   Cholecystitis    Chronic pain    DDD (degenerative disc disease), lumbar 09/2015   dextroscoliosis with multilevel DDD and facet arthrosis most notable for R foraminal disc protrusion L4/5 producing severe R neural foraminal stenosis abutting R L4 nerve root, moderate spinal canal and mild lat recesss and R neural foraminal stenosis L3/4 (MRI)   Depression    bipolar depression   Disorders of porphyrin metabolism    Felon of finger of left hand 11/10/2016   Fibromyalgia    GERD (gastroesophageal reflux disease)    Headache    occasionally   Internal hemorrhoids    Interstitial cystitis 06-06-12   hx.   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 (Dr Baird Cancer retinologist)    Family History  Problem Relation Age of Onset   CAD Father 63       MI, nonsmoker   Esophageal cancer Father 77   Stomach cancer Father    Scleroderma Mother    Hypertension Mother    Esophageal cancer Paternal Grandfather    Stomach cancer Paternal Grandfather    Diabetes Maternal Grandmother    Arthritis Brother    Stroke Neg Hx    Colon cancer Neg Hx    Rectal cancer Neg Hx    Past Surgical History:  Procedure Laterality Date   ANTERIOR CERVICAL DECOMP/DISCECTOMY FUSION  2004   C5/6, C6/7   ANTERIOR CERVICAL DECOMP/DISCECTOMY FUSION  02/2016   C3/4, C4/5 with plating [name])   AUGMENTATION MAMMAPLASTY Bilateral 03/25/2010   BREAST ENHANCEMENT SURGERY  2010   BREAST IMPLANT EXCHANGE  10/2014   exchange saline implants, B mastopexy/capsulorraphy ([facility])   BUNIONECTOMY Bilateral yrs ago  CESAREAN SECTION  1985   x 1   CHOLECYSTECTOMY  06/11/2012   Procedure: LAPAROSCOPIC CHOLECYSTECTOMY WITH INTRAOPERATIVE CHOLANGIOGRAM;  Surgeon: [name], MD,FACS;  Location: [facility];  Service: General;  Laterality: N/A;   COLONOSCOPY  02/2018   done for positive cologuard - WNL, rpt 10 yrs Fuller Plan)   CYSTOSCOPY     ERCP  05/22/2012   Procedure: ENDOSCOPIC RETROGRADE CHOLANGIOPANCREATOGRAPHY (ERCP);  Surgeon: [name], MD,FACG;  Location: [facility];  Service: Endoscopy;  Laterality: N/A;   ERCP N/A 09/17/2013   Procedure: ENDOSCOPIC RETROGRADE CHOLANGIOPANCREATOGRAPHY (ERCP);  Surgeon: [name], MD;   Location: [facility];  Service: Endoscopy;  Laterality: N/A;   ERCP N/A 09/27/2018   Procedure: ENDOSCOPIC RETROGRADE CHOLANGIOPANCREATOGRAPHY (ERCP);  Surgeon: [name], MD;  Location: [facility];  Service: Endoscopy;  Laterality: N/A;   ESOPHAGOGASTRODUODENOSCOPY  09/2016   WNL. esophagus dilated Fuller Plan)   HEMORRHOID BANDING  09-23-13   --Dr. Greer Pickerel   HERNIA REPAIR     inguinal   HYSTEROSCOPY W/ ENDOMETRIAL ABLATION     NASAL SINUS SURGERY     x5   REMOVAL OF STONES  09/27/2018   Procedure: REMOVAL OF STONES;  Surgeon: [name], MD;  Location: [facility];  Service: Endoscopy;;   SPHINCTEROTOMY  09/27/2018   Procedure: SPHINCTEROTOMY;  Surgeon: [name], MD;  Location: [facility];  Service: Endoscopy;;   TOTAL HIP ARTHROPLASTY Left 03/12/2019   Procedure: LEFT TOTAL HIP ARTHROPLASTY ANTERIOR APPROACH;  [name], [name], MD)   UPPER GASTROINTESTINAL ENDOSCOPY     Social History   Social History Narrative   Lives with husband and dog   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   Activity: tries to walk dog (45 min 3x/wk), yoga   Diet: good water, fruits/vegetables daily      Rheum: Public librarian   Psychiatrist: [facility]   Surgery: [facility]mond Pulling   [facility]   Urology: Amalia Hailey   Immunization History  Administered Date(s) Administered   Influenza Split 05/04/2011, 04/04/2012   Influenza Whole 05/25/2007, 04/28/2008, 04/30/2009, 03/26/2010   Influenza,inj,Quad PF,6+ Mos 04/16/2015, 04/15/2016, 05/16/2017, 04/10/2018, 04/19/2019   Influenza-Unspecified 03/25/2014   MMR 11/28/2017   Pneumococcal Conjugate-13 08/02/2013   Pneumococcal Polysaccharide-23 07/30/2009   Td 11/01/2005, 11/10/2016   Zoster Recombinat (Shingrix) 07/21/2017, 11/03/2017     Objective: Vital Signs: BP (!) 145/89 (BP Location: Left Arm, Patient Position: Sitting, Cuff Size: Normal)    Pulse 96    Resp 11    Ht 5' 4"  (1.626 m)    Wt 122  lb 6.4 oz (55.5 kg)    LMP 07/25/1998    BMI 21.01 kg/m    Physical Exam Vitals signs and nursing note reviewed.  Constitutional:      Appearance: She is well-developed.  HENT:     Head: Normocephalic and atraumatic.  Eyes:     Conjunctiva/sclera: Conjunctivae normal.  Neck:     Musculoskeletal: Normal range of motion.  Cardiovascular:     Rate and Rhythm: Normal rate and regular rhythm.     Heart sounds: Normal heart sounds.  Pulmonary:     Effort: Pulmonary effort is normal.     Breath sounds: Normal breath sounds.  Abdominal:     General: Bowel sounds are normal.     Palpations: Abdomen is soft.  Lymphadenopathy:     Cervical: No cervical adenopathy.  Skin:    General: Skin is warm and dry.     Capillary  Refill: Capillary refill takes more than 3 seconds.     Comments: Nailbed capillary changes noted  No digital ulcerations or signs of gangrene.  Telangiectasias on chest, neck, and face  Neurological:     Mental Status: She is alert and oriented to person, place, and time.  Psychiatric:        Behavior: Behavior normal.      Musculoskeletal Exam: C-spine limited ROM.  Thoracic and lumbar spine good ROM.  No midline spinal tenderness.  No SI joint tenderness.  Shoulder joints, elbow joints, wrist joints, MCPs, PIPs, and DIPs good ROM with no synovitis.  Complete fist formation bilaterally.  PIP and DIP synovial thickening consistent with osteoarthritis of both hands.  Left hip replacement limited ROM.  Right hip good ROM with no discomfort.  Knee joints good ROM with no discomfort.  No warmth or effusion of knee joints noted.  Bilateral knee crepitus.  No tenderness or swelling of ankle joints.  No tenderness over trochanteric bursa bilaterally.   CDAI Exam: CDAI Score: -- Patient Global: --; Provider Global: -- Swollen: --; Tender: -- Joint Exam   No joint exam has been documented for this visit   There is currently no information documented on the homunculus. Go to  the Rheumatology activity and complete the homunculus joint exam.  Investigation: No additional findings.  Imaging: Dg Bone Density (dxa)  Result Date: 05/16/2019 EXAM: DUAL X-RAY ABSORPTIOMETRY (DXA) FOR BONE MINERAL DENSITY IMPRESSION: Referring Physician:  Yisroel Ramming, [place] Your patient completed a BMD test using Lunar IDXA DXA system ( analysis version: 16 ) manufactured by [employer]. Technologist: CG PATIENT: Name: GWENDOLYN, MCLEES Patient ID: [id number] Birth Date: 02-Mar-1956 Height: 64.0 in. Sex: Female Measured: 05/16/2019 Weight: 125.2 lbs. Indications: Caucasian, Estrogen Deficient, Family History of Osteoporosis, Height Loss (781.91), Nexium, Postmenopausal Fractures: None Treatments: Calcium (E943.0), Estradiol, Testosterone, Vitamin D (E933.5) ASSESSMENT: The BMD measured at Femur Total is 0.762 g/cm2 with a T-score of -2.0. This patient is considered osteopenic according to [facility]) criteria. There has been a statistically significant decrease in BMD of Right hip since prior exam dated 02/07/2017. The scan quality is good. Lumbar spine was not utilized due to exclusion on prior exam. Left femur was excluded due to surgical hardware. Patient does not meet criteria for FRAX due to treatments of Estradiol. Site Region Measured Date Measured Age YA BMD Significant CHANGE T-score Right Femur Total 05/16/2019 61.3 -2.0 0.762 g/cm2 * Right Femur Total 02/07/2017 59.0 -1.5 0.820 g/cm2 Left Forearm Radius 33% 05/16/2019 61.3 -1.2 0.783 g/cm2 Left Forearm Radius 33% 02/07/2017 59.0 -0.6 0.831 g/cm2 World Health Organization [facility]) criteria for post-menopausal, Caucasian Women: Normal       T-score at or above -1 SD Osteopenia   T-score between -1 and -2.5 SD Osteoporosis T-score at or below -2.5 SD RECOMMENDATION: 1. All patients should optimize calcium and vitamin D intake. 2. Consider FDA approved medical therapies in postmenopausal women and men aged 22 years and  older, based on the following: a. A hip or vertebral (clinical or morphometric) fracture b. T- score < or = -2.5 at the femoral neck or spine after appropriate evaluation to exclude secondary causes c. Low bone mass (T-score between -1.0 and -2.5 at the femoral neck or spine) and a 10 year probability of a hip fracture > or = 3% or a 10 year probability of a major osteoporosis-related fracture > or = 20% based on the [place]-adapted WHO algorithm d. Clinician judgment and/or  patient preferences may indicate treatment for people with 10-year fracture probabilities above or below these levels FOLLOW-UP: Patients with diagnosis of osteoporosis or at high risk for fracture should have regular bone mineral density tests. For patients eligible for Medicare, routine testing is allowed once every 2 years. The testing frequency can be increased to one year for patients who have rapidly progressing disease, those who are receiving or discontinuing medical therapy to restore bone mass, or have additional risk factors. [facility] Radiology Electronically Signed   By: Lowella Grip III M.D.   On: 05/16/2019 13:14    Recent Labs: Lab Results  Component Value Date   WBC 8.3 04/19/2019   HGB 11.2 (L) 04/19/2019   PLT 337 04/19/2019   NA 138 03/13/2019   K 3.8 03/13/2019   CL 105 03/13/2019   CO2 26 03/13/2019   GLUCOSE 108 (H) 03/13/2019   BUN 7 (L) 03/13/2019   CREATININE 0.43 (L) 03/13/2019   BILITOT 0.4 09/25/2018   ALKPHOS 184 (H) 09/25/2018   AST 21 09/25/2018   ALT 22 09/25/2018   PROT 6.5 09/25/2018   ALBUMIN 4.3 09/25/2018   CALCIUM 8.5 (L) 03/13/2019   GFRAA >60 03/13/2019    Speciality Comments: No specialty comments available.  Procedures:  No procedures performed Allergies: Inh [isoniazid], Cymbalta [duloxetine hcl], Nucynta [tapentadol], Silenor [doxepin hcl], Benzoin, Celebrex [celecoxib], Codeine phosphate, Lithium, Meperidine hcl, and Sulfamethoxazole   Assessment / Plan:     Visit  Diagnoses: Fibromyalgia: She has positive tender points on exam.  She continues have generalized muscle aches muscle tenderness due to fibromyalgia.  She has chronic fatigue related to insomnia.  She was encouraged to stay active and exercise on a regular basis.  Chronic pain syndrome: She continues to take Lyrica as prescribed.  CFS (chronic fatigue syndrome): She has chronic fatigue related to insomnia.  We discussed the importance of regular exercise and good sleep hygiene.  Primary osteoarthritis of both knees: She has good range of motion of both knee joints on exam.  No warmth or effusion was noted.  She had cortisone injections in both knee joints on 05/08/2019 which provided significant relief.  She uses Voltaren gel topically as needed for pain relief.  S/P hip replacement, left: Doing well.  The left hip was replaced on 03/12/2019 by Dr. [name].  She has slightly limited range of motion with no discomfort at this time.  Trochanteric bursitis of both hips: She has no tenderness of trochanteric bursa on exam today.  [name] disease without gangrene - She has been experiencing worsening symptoms of [name] recently.  Several days ago she had severe symptoms of [name] in her right fifth digit.  She states that it was discolored, painful, and numb for 72 hours.  During that time she tried to apply nitroglycerin ointment once daily and continue to take Norvasc 7.5 mg by mouth daily.  She is on aspirin 81 mg by mouth daily as well.  She is not experiencing any digital ulcerations or signs of gangrene.  No cyanosis was noted on exam today.  She does have delayed capillary refill greater than 3 seconds.  Nailbed capillary changes were also noted.  She also has telangectiasias on her neck, chest, and face.  We will order AVISE labs for further evaluation.  We will also be adding Imdur 30 mg 1 tablet by mouth daily to her current treatment regimen.   Indications, contraindications, potential side  effects of Imdur was discussed.  All questions were addressed. She  was advised to reduce Norvasc to 5 mg by mouth daily.  She was advised to monitor her blood pressure closely with the new medication change.  She is aware that she is to discontinue using nitroglycerin ointment.  A prescription for Imdur was sent to the pharmacy today.  She was advised to notify us if she cannot tolerate taking Imdur.  She was encouraged to keep her core body temperature warm and wear thick gloves and socks on a regular basis.  We discussed avoiding triggers.  She will notify us if she develops signs or symptoms of digital ulcerations or gangrene.  She will follow-up in 1 month to discuss AVISE labs.   Telangiectasias: She has swelling sensations on her neck, chest, and face.  We also noticed nailbed capillary changes on exam today.  She has been experiencing worsening symptoms of [name].  Some skin tightness was noted in her hands.  She does have a known family history of scleroderma in her mother who died of complications of scleroderma.  Depending on the lab results we will discuss ordering a high-resolution chest CT and consider a cardiology referral for further evaluation and work-up.  We will obtain AVISE labs.  She will follow-up in 1 month to discuss these results.  Family history of scleroderma: Mother-according to the patient her mother died of complications from scleroderma.  DDD (degenerative disc disease), cervical: She has limited range of motion.  She is not experiencing any symptoms of radiculopathy at this time.  She is followed by Dr. [name].  DDD (degenerative disc disease), lumbar: Chronic pain.  She occasionally experiences right-sided sciatica.  She is followed by Dr. [name].  Trigger finger, left middle finger: She asked continues to experience tenderness and locking.  She was advised to notify us if she would like to return for an ultrasound-guided cortisone injection.  Plantar fasciitis,  bilateral: Resolved.  Other medical conditions are listed as follows:  H/O oral aphthous ulcers  Prediabetes  MDD (major depressive disorder), recurrent episode, moderate ([facility])  History of gastroesophageal reflux (GERD)  History of bipolar disorder  Hx of porphyria  Positive QuantiFERON-TB Gold test  Orders: No orders of the defined types were placed in this encounter.  Meds ordered this encounter  Medications   isosorbide mononitrate (IMDUR) 30 MG 24 hr tablet    Sig: Take 1 tablet (30 mg total) by mouth daily.    Dispense:  30 tablet    Refill:  0    Face-to-face time spent with patient was 30 minutes. Greater than 50% of time was spent in counseling and coordination of care.  Follow-Up Instructions: Return in about 4 weeks (around 06/14/2019) for [name] syndrome.   Ofilia Neas, PA-C   I examined and evaluated the patient with Hazel Sams PA.  Patient has been having increased [name].  She has some periungual capillary bed changes.  She also has few telangiectasias on her.  We obtained AVISE today.  If her labs are positive I may proceed with high-resolution CT and cardiology work-up.  The plan of care was discussed as noted above.  Bo Merino, MD  Note - This record has been created using Editor, commissioning.  Chart creation errors have been sought, but may not always  have been located. Such creation errors do not reflect on  the standard of medical care.

## 2019-05-15 NOTE — Telephone Encounter (Signed)
Patient has been scheduled for 05/17/19 at 11:15 am to discuss further treatment options.

## 2019-05-16 ENCOUNTER — Other Ambulatory Visit: Payer: Self-pay

## 2019-05-16 ENCOUNTER — Ambulatory Visit
Admission: RE | Admit: 2019-05-16 | Discharge: 2019-05-16 | Disposition: A | Discharge status: Home / Self Care | Payer: Medicare Other | Source: Ambulatory Visit | Attending: Obstetrics and Gynecology | Admitting: Obstetrics and Gynecology

## 2019-05-16 DIAGNOSIS — M8580 Other specified disorders of bone density and structure, unspecified site: Secondary | ICD-10-CM

## 2019-05-16 DIAGNOSIS — Z780 Asymptomatic menopausal state: Secondary | ICD-10-CM

## 2019-05-17 ENCOUNTER — Ambulatory Visit (INDEPENDENT_AMBULATORY_CARE_PROVIDER_SITE_OTHER): Payer: Medicare Other | Admitting: Rheumatology

## 2019-05-17 ENCOUNTER — Encounter: Payer: Self-pay | Admitting: Rheumatology

## 2019-05-17 VITALS — BP 145/89 | HR 96 | Resp 11 | Ht 64.0 in | Wt 122.4 lb

## 2019-05-17 DIAGNOSIS — R7303 Prediabetes: Secondary | ICD-10-CM

## 2019-05-17 DIAGNOSIS — Z8719 Personal history of other diseases of the digestive system: Secondary | ICD-10-CM

## 2019-05-17 DIAGNOSIS — Z8659 Personal history of other mental and behavioral disorders: Secondary | ICD-10-CM

## 2019-05-17 DIAGNOSIS — M170 Bilateral primary osteoarthritis of knee: Secondary | ICD-10-CM

## 2019-05-17 DIAGNOSIS — M5136 Other intervertebral disc degeneration, lumbar region: Secondary | ICD-10-CM

## 2019-05-17 DIAGNOSIS — M5030 Other cervical disc degeneration, unspecified cervical region: Secondary | ICD-10-CM

## 2019-05-17 DIAGNOSIS — M7061 Trochanteric bursitis, right hip: Secondary | ICD-10-CM

## 2019-05-17 DIAGNOSIS — M797 Fibromyalgia: Secondary | ICD-10-CM

## 2019-05-17 DIAGNOSIS — M722 Plantar fascial fibromatosis: Secondary | ICD-10-CM

## 2019-05-17 DIAGNOSIS — R7612 Nonspecific reaction to cell mediated immunity measurement of gamma interferon antigen response without active tuberculosis: Secondary | ICD-10-CM

## 2019-05-17 DIAGNOSIS — M7062 Trochanteric bursitis, left hip: Secondary | ICD-10-CM

## 2019-05-17 DIAGNOSIS — M51369 Other intervertebral disc degeneration, lumbar region without mention of lumbar back pain or lower extremity pain: Secondary | ICD-10-CM

## 2019-05-17 DIAGNOSIS — R5382 Chronic fatigue, unspecified: Secondary | ICD-10-CM | POA: Diagnosis not present

## 2019-05-17 DIAGNOSIS — M65332 Trigger finger, left middle finger: Secondary | ICD-10-CM

## 2019-05-17 DIAGNOSIS — Z8269 Family history of other diseases of the musculoskeletal system and connective tissue: Secondary | ICD-10-CM

## 2019-05-17 DIAGNOSIS — G894 Chronic pain syndrome: Secondary | ICD-10-CM | POA: Diagnosis not present

## 2019-05-17 DIAGNOSIS — Z96642 Presence of left artificial hip joint: Secondary | ICD-10-CM

## 2019-05-17 DIAGNOSIS — Z8639 Personal history of other endocrine, nutritional and metabolic disease: Secondary | ICD-10-CM

## 2019-05-17 DIAGNOSIS — I7300 Raynaud's syndrome without gangrene: Secondary | ICD-10-CM

## 2019-05-17 DIAGNOSIS — F331 Major depressive disorder, recurrent, moderate: Secondary | ICD-10-CM

## 2019-05-17 DIAGNOSIS — G9332 Myalgic encephalomyelitis/chronic fatigue syndrome: Secondary | ICD-10-CM

## 2019-05-17 DIAGNOSIS — I781 Nevus, non-neoplastic: Secondary | ICD-10-CM

## 2019-05-17 MED ORDER — ISOSORBIDE MONONITRATE ER 30 MG PO TB24: 30.0000 mg | ORAL_TABLET | Freq: Every day | ORAL | 0 refills | Status: DC

## 2019-05-21 ENCOUNTER — Encounter: Payer: Self-pay | Admitting: Physician Assistant

## 2019-05-21 ENCOUNTER — Other Ambulatory Visit: Payer: Self-pay | Admitting: Family Medicine

## 2019-05-21 NOTE — Telephone Encounter (Signed)
Name of Medication: [place] Name of Pharmacy: [facility] or Written Date and Quantity: 04/26/19, #60 Last Office Visit and Type: 04/19/19, chronic pain f/u Next Office Visit and Type: 07/09/19, CPE prt 2 Last Controlled Substance Agreement Date: 03/14/17 Last UDS: 04/19/19

## 2019-05-22 NOTE — Telephone Encounter (Signed)
ERx 

## 2019-05-27 ENCOUNTER — Other Ambulatory Visit: Payer: Self-pay | Admitting: Family Medicine

## 2019-05-27 NOTE — Telephone Encounter (Signed)
Last office visit 04/19/2019 for chronic pain management.  Last refilled 03/06/2019 for #90 with no refills.  UDS/Contract 04/19/2019.  CPE scheduled for 07/09/2019.

## 2019-05-28 ENCOUNTER — Encounter: Payer: Self-pay | Admitting: Physician Assistant

## 2019-05-28 ENCOUNTER — Other Ambulatory Visit: Payer: Self-pay | Admitting: Family Medicine

## 2019-05-28 ENCOUNTER — Telehealth: Payer: Self-pay | Admitting: Gastroenterology

## 2019-05-28 ENCOUNTER — Other Ambulatory Visit (INDEPENDENT_AMBULATORY_CARE_PROVIDER_SITE_OTHER): Payer: Medicare Other

## 2019-05-28 ENCOUNTER — Ambulatory Visit (INDEPENDENT_AMBULATORY_CARE_PROVIDER_SITE_OTHER): Payer: Medicare Other | Admitting: Physician Assistant

## 2019-05-28 VITALS — BP 124/72 | HR 88 | Temp 98.5°F | Ht 64.0 in | Wt 122.0 lb

## 2019-05-28 DIAGNOSIS — R112 Nausea with vomiting, unspecified: Secondary | ICD-10-CM

## 2019-05-28 DIAGNOSIS — K5900 Constipation, unspecified: Secondary | ICD-10-CM

## 2019-05-28 DIAGNOSIS — R1013 Epigastric pain: Secondary | ICD-10-CM | POA: Diagnosis not present

## 2019-05-28 DIAGNOSIS — K831 Obstruction of bile duct: Secondary | ICD-10-CM | POA: Diagnosis not present

## 2019-05-28 LAB — COMPREHENSIVE METABOLIC PANEL
ALT: 8 U/L (ref 0–35)
AST: 15 U/L (ref 0–37)
Albumin: 4.6 g/dL (ref 3.5–5.2)
Alkaline Phosphatase: 117 U/L (ref 39–117)
BUN: 11 mg/dL (ref 6–23)
CO2: 30 mEq/L (ref 19–32)
Calcium: 9.2 mg/dL (ref 8.4–10.5)
Chloride: 100 mEq/L (ref 96–112)
Creatinine, Ser: 0.54 mg/dL (ref 0.40–1.20)
GFR: 114.64 mL/min (ref >60.00)
Glucose, Bld: 107 mg/dL — ABNORMAL HIGH (ref 70–99)
Potassium: 4.4 mEq/L (ref 3.5–5.1)
Sodium: 136 mEq/L (ref 135–145)
Total Bilirubin: 0.3 mg/dL (ref 0.2–1.2)
Total Protein: 6.9 g/dL (ref 6.0–8.3)

## 2019-05-28 LAB — CBC WITH DIFFERENTIAL/PLATELET
Basophils Absolute: 0.1 10*3/uL (ref 0.0–0.1)
Basophils Relative: 0.6 % (ref 0.0–3.0)
Eosinophils Absolute: 0.1 10*3/uL (ref 0.0–0.7)
Eosinophils Relative: 1.4 % (ref 0.0–5.0)
HCT: 38.8 % (ref 36.0–46.0)
Hemoglobin: 12.7 g/dL (ref 12.0–15.0)
Lymphocytes Relative: 33.8 % (ref 12.0–46.0)
Lymphs Abs: 3.1 10*3/uL (ref 0.7–4.0)
MCHC: 32.7 g/dL (ref 30.0–36.0)
MCV: 87.3 fl (ref 78.0–100.0)
Monocytes Absolute: 0.6 10*3/uL (ref 0.1–1.0)
Monocytes Relative: 6.8 % (ref 3.0–12.0)
Neutro Abs: 5.3 10*3/uL (ref 1.4–7.7)
Neutrophils Relative %: 57.4 % (ref 43.0–77.0)
Platelets: 342 10*3/uL (ref 150.0–400.0)
RBC: 4.45 Mil/uL (ref 3.87–5.11)
RDW: 13.6 % (ref 11.5–15.5)
WBC: 9.3 10*3/uL (ref 4.0–10.5)

## 2019-05-28 LAB — LIPASE: Lipase: 36 U/L (ref 11.0–59.0)

## 2019-05-28 MED ORDER — AMBULATORY NON FORMULARY MEDICATION: 1 refills | Status: DC

## 2019-05-28 MED ORDER — PANTOPRAZOLE SODIUM 40 MG PO TBEC: 40.0000 mg | DELAYED_RELEASE_TABLET | Freq: Two times a day (BID) | ORAL | 5 refills | Status: DC

## 2019-05-28 NOTE — Telephone Encounter (Signed)
Patient with epigastric pain and heartburn that started last week.  She reports vomiting this am She is worried she has a repeat CBD stone. She will come in and see Ellouise Newer, PA at 2:50

## 2019-05-28 NOTE — Progress Notes (Signed)
Chief Complaint: Abdominal pain and vomiting  HPI:    Rachel Duran is a 63 year old female with a past medical history as listed below, known to Dr. Fuller Plan, who presents to clinic today with a complaint of nausea and vomiting as well as abdominal pain.    02/2018 colonoscopy normal.    09/25/2018 office visit with Dr. Fuller Plan patient described lower sternal chest pain, also worsening constipation after pain medication.  At that time started Movantik 25 mg daily.  Was also discussed she had symptoms consistent with prior biliary episodes.  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  Most recent ERCP was in 2015 with a sphincterotomy, extension performed.  Continued on ursodiol 300 mg twice daily.  Also told to start a light diet.  Labs were drawn CMP, CBC, lipase.  Was then recommended to consider right upper quadrant ultrasound, MRCP or ERCP based on blood work results.  CMP with alk phos elevated at 184.  There was high suspicion for biliary sludge, stones and ERCP was scheduled the next day.    09/27/2018 ERCP with common bile duct dilation with a stone causing obstruction, air in the intrahepatic ducts, prior cholecystectomy, choledocholithiasis and biliary sludge found, complete removal was accomplished by biliary sphincterotomy extension and balloon extraction.    Today, the patient presents clinic and explains that on Wednesday, 05/22/2019 she started with worsening heartburn and terrible reflux with burning all the way up her esophagus, when she laid down this was even more severe and now she is sleeping propped up with 3 pillows.  This is regardless of her Nexium 40 mg twice daily, but "I have been on that forever".  She then started with epigastric pain which is a 9-10/10 and radiates through to her back which feels like the "same spot is when I have a stone".  Also with constant extreme nausea over this time.  In fact this morning she woke up and vomited 2 times.  She has only eaten half  a piece of toast today.  Did take a Zofran before coming in this helps.    Chronic constipation for which she uses a mixture of Linzess, Movantik and Senokot.  She seems to be controlling this okay.    Denies fever, chills, weight loss or blood in her stool.  Past Medical History:  Diagnosis Date   Abdominal pain last 4 months   and nausea also   Allergy    Anemia    history of   Anxiety    Bipolar disorder (HCC)    atpical bipolar disorder   Cervical disc disease limited rom turning to left   hx. C6- C7 -hx. past fusion(bone graft used)   Cholecystitis    Chronic pain    DDD (degenerative disc disease), lumbar 09/2015   dextroscoliosis with multilevel DDD and facet arthrosis most notable for R foraminal disc protrusion L4/5 producing severe R neural foraminal stenosis abutting R L4 nerve root, moderate spinal canal and mild lat recesss and R neural foraminal stenosis L3/4 (MRI)   Depression    bipolar depression   Disorders of porphyrin metabolism    Felon of finger of left hand 11/10/2016   Fibromyalgia    GERD (gastroesophageal reflux disease)    Headache    occasionally   Internal hemorrhoids    Interstitial cystitis 06-06-12   hx.   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 (Dr Baird Cancer retinologist)    Past Surgical History:  Procedure Laterality Date   ANTERIOR CERVICAL DECOMP/DISCECTOMY FUSION  2004   C5/6, C6/7   ANTERIOR CERVICAL DECOMP/DISCECTOMY FUSION  02/2016   C3/4, C4/5 with plating [name])   AUGMENTATION MAMMAPLASTY Bilateral 03/25/2010   BREAST  ENHANCEMENT SURGERY  2010   BREAST IMPLANT EXCHANGE  10/2014   exchange saline implants, B mastopexy/capsulorraphy ([facility])   BUNIONECTOMY Bilateral yrs ago   [place]   x 1   CHOLECYSTECTOMY  06/11/2012   Procedure: LAPAROSCOPIC CHOLECYSTECTOMY WITH INTRAOPERATIVE CHOLANGIOGRAM;  Surgeon: [name], MD,FACS;  Location: [facility];  Service: General;  Laterality: N/A;   COLONOSCOPY  02/2018   done for positive cologuard - WNL, rpt 10 yrs Fuller Plan)   CYSTOSCOPY     ERCP  05/22/2012   Procedure: ENDOSCOPIC RETROGRADE CHOLANGIOPANCREATOGRAPHY (ERCP);  Surgeon: [name], MD,FACG;  Location: [facility];  Service: Endoscopy;  Laterality: N/A;   ERCP N/A 09/17/2013   Procedure: ENDOSCOPIC RETROGRADE CHOLANGIOPANCREATOGRAPHY (ERCP);  Surgeon: [name], MD;  Location: [facility];  Service: Endoscopy;  Laterality: N/A;   ERCP N/A 09/27/2018   Procedure: ENDOSCOPIC RETROGRADE CHOLANGIOPANCREATOGRAPHY (ERCP);  Surgeon: [name], MD;  Location: [facility];  Service: Endoscopy;  Laterality: N/A;   ESOPHAGOGASTRODUODENOSCOPY  09/2016   WNL. esophagus dilated Fuller Plan)   HEMORRHOID BANDING  09-23-13   --Dr. Greer Pickerel   HERNIA REPAIR     inguinal   HYSTEROSCOPY W/ ENDOMETRIAL ABLATION     NASAL SINUS SURGERY     x5   REMOVAL OF STONES  09/27/2018   Procedure: REMOVAL OF STONES;  Surgeon: [name], MD;  Location: [facility];  Service: Endoscopy;;   SPHINCTEROTOMY  09/27/2018   Procedure: [name];  Surgeon: [name], MD;  Location: [facility];  Service: Endoscopy;;   TOTAL HIP ARTHROPLASTY Left 03/12/2019   Procedure: LEFT TOTAL HIP ARTHROPLASTY ANTERIOR APPROACH;  [name], [name], MD)   UPPER GASTROINTESTINAL ENDOSCOPY      Current Outpatient Medications  Medication Sig Dispense Refill   ALPRAZolam (XANAX) 1 MG tablet Take 0.5 mg by mouth at bedtime.   0   amLODipine (NORVASC) 5 MG tablet Take 1 tablet  (5 mg total) by mouth daily. Take 1 and a half tablet (7.5 mg) by mouth daily (Patient taking differently: Take 5 mg by mouth daily. ) 45 tablet 2   amphetamine-dextroamphetamine (ADDERALL XR) 30 MG 24 hr capsule TAKE 1 CAPSULE BY MOUTH EVERY MORNING 90 capsule 0   ARIPiprazole (ABILIFY) 5 MG tablet Take 5 mg by mouth at bedtime.      aspirin 81 MG chewable tablet Chew 1 tablet (81 mg total) by mouth 2 (two) times daily. 35 tablet 0   calcium-vitamin D (OSCAL WITH D) 500-200 MG-UNIT tablet Take 1 tablet by mouth 2 (two) times daily.     cyanocobalamin (,VITAMIN B-12,) 1000 MCG/ML injection INJECT 1 ML IM EVERY 21 DAYS 1 mL 6   diclofenac sodium (VOLTAREN) 1 % GEL Apply 2-4 grams to affected joint up to 4 times daily. 400 g 4   esomeprazole (  NEXIUM) 40 MG capsule TAKE 1 CAPSULE BY MOUTH TWICE DAILY BEFORE A MEAL (Patient taking differently: Take 40 mg by mouth 2 (two) times daily before a meal. ) 60 capsule 9   Estradiol (YUVAFEM) 10 MCG TABS vaginal tablet Place 1 tablet (10 mcg total) vaginally 3 (three) times a week. Please keep on file. (Patient taking differently: Place 1 tablet vaginally See admin instructions. Place 1 tablet vaginally Tuesday, Thursday and Saturday.) 12 tablet 10   Eszopiclone 3 MG TABS Take 1 tablet (3 mg total) by mouth at bedtime. 90 tablet 0   ferrous sulfate 325 (65 FE) MG tablet Take 325 mg by mouth every Monday, Wednesday, and Friday.      fluticasone (FLONASE) 50 MCG/ACT nasal spray Place 2 sprays into both nostrils daily as needed (seasonal allergies). (Patient taking differently: Place 1 spray into both nostrils daily as needed for allergies. ) 16 g 5   isosorbide mononitrate (IMDUR) 30 MG 24 hr tablet Take 1 tablet (30 mg total) by mouth daily. 30 tablet 0   KADIAN 40 MG CP24 TAKE 1 CAPSULE TWICE DAILY 60 capsule 0   lamoTRIgine (LAMICTAL) 200 MG tablet Take 100 mg by mouth daily.      LINZESS 290 MCG CAPS capsule TAKE 1 CAPSULE BY MOUTH EVERY DAY 90  capsule 1   methocarbamol (ROBAXIN) 500 MG tablet Take 1 tablet (500 mg total) by mouth every 6 (six) hours as needed for muscle spasms. 40 tablet 1   Multiple Vitamins-Minerals (PRESERVISION AREDS 2 PO) Take 1 capsule by mouth 2 (two) times daily.     naloxegol oxalate (MOVANTIK) 12.5 MG TABS tablet Take 1 tablet (12.5 mg total) by mouth daily. 30 tablet 3   NONFORMULARY OR COMPOUNDED ITEM Testosterone propionate 2% in white petrolatum, apply small amount once daily for 5 days per week. (Patient taking differently: Apply 1 application topically See admin instructions. Testosterone propionate 2% in white petrolatum, Apply small amount topically at bedtime on Tuesday, Thursday and Saturday.) 60 each 2   ondansetron (ZOFRAN ODT) 4 MG disintegrating tablet Take 1 tablet (4 mg total) by mouth every 8 (eight) hours as needed for nausea or vomiting. 20 tablet 1   ondansetron (ZOFRAN) 4 MG tablet TAKE ONE TABLET EVERY 8 HOURS AS NEEDED FOR NAUSEA OR VOMITING 30 tablet 1   pentosan polysulfate (ELMIRON) 100 MG capsule Take 200 mg by mouth 2 (two) times daily.      polyethylene glycol (MIRALAX) 17 g packet Take 17 g by mouth 3 (three) times daily.      pregabalin (LYRICA) 150 MG capsule TAKE 1 CAPSULE BY MOUTH TWICE DAILY (Patient taking differently: Take 150 mg by mouth 2 (two) times daily. ) 180 capsule 0   senna (SENOKOT) 8.6 MG tablet Take 2 tablets by mouth at bedtime.     SF 5000 PLUS 1.1 % CREA dental cream Place 1 application onto teeth at bedtime.      traMADol (ULTRAM) 50 MG tablet TAKE ONE TABLET BY MOUTH TWICE DAILY AS NEEDED (Patient taking differently: Take 25 mg by mouth 2 (two) times daily as needed for moderate pain. ) 40 tablet 0   ursodiol (ACTIGALL) 300 MG capsule Take 1 capsule (300 mg total) by mouth 2 (two) times daily. 60 capsule 5   No current facility-administered medications for this visit.     Allergies as of 05/28/2019 - Review Complete 05/17/2019  Allergen  Reaction Noted   Inh [isoniazid] Other (See Comments) 05/11/2012   Cymbalta [  duloxetine hcl] Other (See Comments) 11/15/2011   Nucynta [tapentadol] Other (See Comments) 06/03/2015   Silenor [doxepin hcl] Other (See Comments) 11/03/2014   Benzoin Dermatitis 03/12/2019   Celebrex [celecoxib] Other (See Comments) 03/09/2015   Codeine phosphate Nausea And Vomiting and Other (See Comments) 12/06/2006   Lithium Other (See Comments) 10/07/2016   Meperidine hcl Rash and Other (See Comments) 11/09/2007   Sulfamethoxazole Rash 12/06/2006    Family History  Problem Relation Age of Onset   CAD Father 19       MI, nonsmoker   Esophageal cancer Father 12   Stomach cancer Father    Scleroderma Mother    Hypertension Mother    Esophageal cancer Paternal Grandfather    Stomach cancer Paternal Grandfather    Diabetes Maternal Grandmother    Arthritis Brother    Stroke Neg Hx    Colon cancer Neg Hx    Rectal cancer Neg Hx     Social History   Socioeconomic History   Marital status: Married    Spouse name: Not on file   Number of children: Not on file   Years of education: Not on file   Highest education level: Not on file  Occupational History   Occupation: retired    Fish farm manager: UNEMPLOYED  Social Designer, fashion/clothing strain: Not on file   Food insecurity    Worry: Not on file    Inability: Not on file   Transportation needs    Medical: Not on file    Non-medical: Not on file  Tobacco Use   Smoking status: Never Smoker   Smokeless tobacco: Never Used  Substance and Sexual Activity   Alcohol use: No    Alcohol/week: 0.0 standard drinks   Drug use: No   Sexual activity: Yes    Partners: Male    Birth control/protection: Post-menopausal    Comment: vasectomy  Lifestyle   Physical activity    Days per week: Not on file    Minutes per session: Not on file   Stress: Not on file  Relationships   Social connections    Talks on phone:  Not on file    Gets together: Not on file    Attends religious service: Not on file    Active member of club or organization: Not on file    Attends meetings of clubs or organizations: Not on file    Relationship status: Not on file   Intimate partner violence    Fear of current or ex partner: Not on file    Emotionally abused: Not on file    Physically abused: Not on file    Forced sexual activity: Not on file  Other Topics Concern   Not on file  Social History Narrative   Lives with husband and dog   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   Activity: tries to walk dog (45 min 3x/wk), yoga   Diet: good water, fruits/vegetables daily      Rheum: Public librarian   Psychiatrist: Chief Executive Officer   Surgery: Redmond Pulling   GIFuller Plan   Urology: Amalia Hailey    Review of Systems:    Constitutional: No weight loss, fever or chills Cardiovascular: No chest pain Respiratory: No SOB  Gastrointestinal: See HPI and otherwise negative   Physical Exam:  Vital signs: BP 124/72    Pulse 88    Temp 98.5 F (36.9 C)    Ht '5\' 4"'$  (1.626 m)    Wt 122 lb (  55.3 kg)    LMP 07/25/1998    BMI 20.94 kg/m   Constitutional:   Pleasant Caucasian female appears to be in NAD, Well developed, Well nourished, alert and cooperative Respiratory: Respirations even and unlabored. Lungs clear to auscultation bilaterally.   No wheezes, crackles, or rhonchi.  Cardiovascular: Normal S1, S2. No MRG. Regular rate and rhythm. No peripheral edema, cyanosis or pallor.  Gastrointestinal:  Soft, nondistended, moderate epigastric ttp. No rebound or guarding. Normal bowel sounds. No appreciable masses or hepatomegaly. Rectal:  Not performed.  Psychiatric:  Demonstrates good judgement and reason without abnormal affect or behaviors.  RELEVANT LABS AND IMAGING: CBC    Component Value Date/Time   WBC 8.3 04/19/2019 1440   RBC 4.02 04/19/2019 1440   HGB 11.2 (L) 04/19/2019 1440   HGB 12.3 02/04/2014   HCT 34.5  (L) 04/19/2019 1440   PLT 337 04/19/2019 1440   MCV 85.8 04/19/2019 1440   MCH 27.9 04/19/2019 1440   MCHC 32.5 04/19/2019 1440   RDW 13.0 04/19/2019 1440   LYMPHSABS 2,523 04/19/2019 1440   MONOABS 0.5 09/25/2018 1443   EOSABS 149 04/19/2019 1440   BASOSABS 58 04/19/2019 1440    CMP     Component Value Date/Time   NA 138 03/13/2019 0424   K 3.8 03/13/2019 0424   K 4.3 02/04/2014   CL 105 03/13/2019 0424   CO2 26 03/13/2019 0424   GLUCOSE 108 (H) 03/13/2019 0424   BUN 7 (L) 03/13/2019 0424   CREATININE 0.43 (L) 03/13/2019 0424   CREATININE 0.56 02/04/2014   CALCIUM 8.5 (L) 03/13/2019 0424   PROT 6.5 09/25/2018 1443   ALBUMIN 4.3 09/25/2018 1443   AST 21 09/25/2018 1443   AST 18 02/04/2014   ALT 22 09/25/2018 1443   ALT 13 02/04/2014   ALKPHOS 184 (H) 09/25/2018 1443   ALKPHOS 84 02/04/2014   BILITOT 0.4 09/25/2018 1443   BILITOT 0.4 02/04/2014   GFRNONAA >60 03/13/2019 0424   GFRNONAA >89 04/11/2013 1213   GFRAA >60 03/13/2019 0424   GFRAA >89 04/11/2013 1213    Assessment: 1.  Epigastric abdominal pain with nausea and vomiting: History of biliary stasis maintained on Ursodiol 300 mg twice daily, last ERCP in March choledocholithiasis and extension of sphincterotomy 2.  GERD: Uncontrolled on Nexium 40 mg twice daily, consider relation to above +/- medication tachyphylaxis  Plan: 1.  Ordered CMP, CBC and lipase.  Pending labs could consider ultrasound versus MRCP/ERCP. 2.  Recommend a light diet for now. 3.  Continue Ursodiol 300 mg twice daily 4.  Stop Nexium.  Prescribed Pantoprazole 40 mg twice daily #60 with 5 refills 5.  Prescribe GI cocktail 5-10 mL as needed for breakthrough symptoms 6.  Discussed with patient that we will call her after labs above to discuss next steps. 7.  Patient to follow in clinic with me or Dr. Fuller Plan per recommendations.  Ellouise Newer, PA-C [facility] 05/28/2019, 2:51 PM  Cc: Ria Bush, MD

## 2019-05-28 NOTE — Progress Notes (Signed)
Reviewed and agree with management plan.  Haidan Nhan T. Emeterio Balke, MD FACG [facility]  

## 2019-05-28 NOTE — Patient Instructions (Signed)
We have sent the following medications to your pharmacy for you to pick up at your convenience: Pantoprazole 40 mg twice a day   Gi cocktail 5-10 mls evey 4-6 hours as needed for pain  Stop Nexium   Your provider has requested that you go to the basement level for lab work before leaving today. Press "B" on the elevator. The lab is located at the first door on the left as you exit the elevator.

## 2019-05-28 NOTE — Telephone Encounter (Signed)
Pt reported that she is having N/V and GERD.  She requested an appointment ASAP.

## 2019-05-29 NOTE — Progress Notes (Signed)
Office Visit Note  Patient: Rachel Duran             Date of Birth: Feb 05, 1956           MRN: [id number]             PCP: Ria Bush, MD Referring: Ria Bush, MD Visit Date: 06/04/2019 Occupation: '@GUAROCC'$ @  Subjective:  Discuss AVISE labs   History of Present Illness: Rachel Duran is a 63 y.o. female with history of [name] syndrome, fibromyalgia, and osteoarthritis.  She has been experiencing worsening symptoms of [name].  She was previously taking Norvasc 7.5 mg by mouth daily, aspirin 81 mg by mouth daily, night using nitroglycerin ointment as needed.  She is evaluated on 05/17/2019 and was started on Imdur 30 mg 1 tablet daily and advised to reduce Norvasc to 5 mg and to discontinue nitroglycerin ointment use.  She cannot tolerate taking Imdur due to severe headaches and nausea.  While taking Imdur she noticed significant improvement in her symptoms but discontinued several days ago.  She continues have intermittent symptoms of [name]. She reports that she has had recurrent bile duct stones and is currently having a blockage.  She states that she is scheduled for an MRCP on 06/27/2019 with Dr. Fuller Plan.  She has been vomiting bile on a nightly basis.  She states that with all the stress she has been having more frequent fibromyalgia flares.  She has generalized muscle aches and muscle tenderness.  She has chronic fatigue related to insomnia.  She states her knee joint pain has improved since having the cortisone injections.  She states that she has mild tenderness over the right trochanteric bursa but Voltaren gel has been effective.   Activities of Daily Living:  Patient reports morning stiffness for 2 hours.   Patient Reports nocturnal pain.  Difficulty dressing/grooming: Reports Difficulty climbing stairs: Reports Difficulty getting out of chair: Reports Difficulty using hands for taps, buttons, cutlery, and/or writing: Reports  Review of Systems   Constitutional: Positive for fatigue.  HENT: Positive for mouth dryness. Negative for mouth sores and nose dryness.   Eyes: Positive for dryness. Negative for pain and visual disturbance.  Respiratory: Negative for cough, hemoptysis, shortness of breath and difficulty breathing.   Cardiovascular: Negative for chest pain, palpitations, hypertension and swelling in legs/feet.  Gastrointestinal: Positive for constipation. Negative for blood in stool and diarrhea.  Endocrine: Negative for increased urination.  Genitourinary: Negative for difficulty urinating and painful urination.  Musculoskeletal: Positive for arthralgias, joint pain, morning stiffness and muscle tenderness. Negative for joint swelling, myalgias, muscle weakness and myalgias.  Skin: Negative for color change, pallor, rash, hair loss, nodules/bumps, skin tightness, ulcers and sensitivity to sunlight.  Allergic/Immunologic: Negative for susceptible to infections.  Neurological: Positive for numbness and headaches. Negative for dizziness and weakness.  Hematological: Negative for swollen glands.  Psychiatric/Behavioral: Positive for sleep disturbance. Negative for depressed mood. The patient is not nervous/anxious.     PMFS History:  Patient Active Problem List   Diagnosis Date Noted   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   Unilateral primary osteoarthritis, left hip 01/28/2019   Pelvic pain 12/12/2018   Common bile duct (CBD) obstruction    Elevated alkaline phosphatase level    Venous insufficiency of left lower extremity 07/04/2018   Dyslipidemia 09/22/2017   DDD (degenerative disc disease), cervical 04/03/2017   DDD (degenerative disc disease), lumbar 04/03/2017   Onychomycosis 03/21/2017   Dysphagia 10/18/2016   Encounter for  chronic pain management 10/18/2016   Biliary stasis 09/26/2016   Fever blister 08/18/2016   Urinary urgency 07/05/2016   HNP (herniated nucleus pulposus),  lumbar 09/23/2015   Sinus congestion 07/30/2015   Iron deficiency 07/30/2015   Health maintenance examination 06/15/2015   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   Headache 03/09/2015   Clavicle enlargement 12/13/2014   Prediabetes 12/13/2014   Plantar fasciitis, bilateral 09/11/2014   Advanced care planning/counseling discussion 06/10/2014   Medicare annual wellness visit, subsequent 06/10/2014   Abnormal thyroid function test 06/10/2014   Chronic pain syndrome 06/10/2014   CFS (chronic fatigue syndrome) 04/08/2014   Calculus of bile duct 09/17/2013   Postmenopausal atrophic vaginitis 10/19/2012   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   Cervical disc disorder with radiculopathy of cervical region 05/28/2010   Postoperative anemia due to acute blood loss 03/17/2009   CHEST PAIN UNSPECIFIED 02/28/2008   APHTHOUS ULCERS 01/31/2008   INSOMNIA, CHRONIC 11/09/2007   Drug-induced constipation 10/11/2007   Allergic rhinitis 04/12/2007   MDD (major depressive disorder), recurrent episode, moderate ([place]) 03/05/2007   [name] syndrome 03/05/2007   GERD 03/05/2007   ROSACEA 03/05/2007   NEURALGIA 03/05/2007   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 C6- C7 -hx. past fusion(bone graft used)   Cholecystitis    Chronic pain    DDD (degenerative disc disease), lumbar 09/2015   dextroscoliosis with multilevel DDD and facet arthrosis most notable for R foraminal disc protrusion L4/5 producing severe R neural foraminal stenosis abutting R L4 nerve root, moderate spinal canal and mild lat recesss and R neural foraminal stenosis  L3/4 (MRI)   Depression    bipolar depression   Disorders of porphyrin metabolism    Felon of finger of left hand 11/10/2016   Fibromyalgia    GERD (gastroesophageal reflux disease)    Headache    occasionally   Internal hemorrhoids    Interstitial cystitis 06-06-12   hx.   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 (Dr Baird Cancer retinologist)    Family History  Problem Relation Age of Onset   CAD Father 49       MI, nonsmoker   Esophageal cancer Father 60   Stomach cancer Father    Scleroderma Mother    Hypertension Mother    Esophageal cancer Paternal Grandfather    Stomach cancer Paternal Grandfather    Diabetes Maternal Grandmother    Arthritis Brother    Stroke Neg Hx    Colon cancer Neg Hx    Rectal cancer Neg Hx    Past Surgical History:  Procedure Laterality Date   ANTERIOR CERVICAL DECOMP/DISCECTOMY FUSION  2004   C5/6,  C6/7   ANTERIOR CERVICAL DECOMP/DISCECTOMY FUSION  02/2016   C3/4, C4/5 with plating [name])   AUGMENTATION MAMMAPLASTY Bilateral 03/25/2010   BREAST ENHANCEMENT SURGERY  2010   BREAST IMPLANT EXCHANGE  10/2014   exchange saline implants, B mastopexy/capsulorraphy ([facility])   BUNIONECTOMY Bilateral yrs ago   [place]   x 1   CHOLECYSTECTOMY  06/11/2012   Procedure: LAPAROSCOPIC CHOLECYSTECTOMY WITH INTRAOPERATIVE CHOLANGIOGRAM;  Surgeon: [name], MD,FACS;  Location: [facility];  Service: General;  Laterality: N/A;   COLONOSCOPY  02/2018   done for positive cologuard - WNL, rpt 10 yrs Fuller Plan)   CYSTOSCOPY      ERCP  05/22/2012   Procedure: ENDOSCOPIC RETROGRADE CHOLANGIOPANCREATOGRAPHY (ERCP);  Surgeon: [name], MD,FACG;  Location: [facility];  Service: Endoscopy;  Laterality: N/A;   ERCP N/A 09/17/2013   Procedure: ENDOSCOPIC RETROGRADE CHOLANGIOPANCREATOGRAPHY (ERCP);  Surgeon: [name], MD;  Location: [facility];  Service: Endoscopy;  Laterality: N/A;   ERCP N/A 09/27/2018   Procedure: ENDOSCOPIC RETROGRADE CHOLANGIOPANCREATOGRAPHY (ERCP);  Surgeon: [name], MD;  Location: [facility];  Service: Endoscopy;  Laterality: N/A;   ESOPHAGOGASTRODUODENOSCOPY  09/2016   WNL. esophagus dilated Fuller Plan)   HEMORRHOID BANDING  09-23-13   --Dr. Greer Pickerel   HERNIA REPAIR     inguinal   HYSTEROSCOPY W/ ENDOMETRIAL ABLATION     NASAL SINUS SURGERY     x5   REMOVAL OF STONES  09/27/2018   Procedure: REMOVAL OF STONES;  Surgeon: [name], MD;  Location: [facility];  Service: Endoscopy;;   SPHINCTEROTOMY  09/27/2018   Procedure: SPHINCTEROTOMY;  Surgeon: [name], MD;  Location: [facility];  Service: Endoscopy;;   TOTAL HIP ARTHROPLASTY Left 03/12/2019   Procedure: LEFT TOTAL HIP ARTHROPLASTY ANTERIOR APPROACH;  [name], [name], MD)   UPPER GASTROINTESTINAL ENDOSCOPY     Social History   Social History Narrative   Lives with husband and dog   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   Activity: tries to walk dog (45 min 3x/wk), yoga   Diet: good water, fruits/vegetables daily      Rheum: Public librarian   Psychiatrist: [employer] [facility]   Surgery: Redmond Pulling   GIFuller Plan   Urology: Amalia Hailey   Immunization History  Administered Date(s) Administered   Influenza Split 05/04/2011, 04/04/2012   Influenza Whole 05/25/2007, 04/28/2008, 04/30/2009, 03/26/2010   Influenza,inj,Quad PF,6+ Mos 04/16/2015, 04/15/2016, 05/16/2017, 04/10/2018, 04/19/2019   Influenza-Unspecified 03/25/2014   MMR 11/28/2017   Pneumococcal Conjugate-13  08/02/2013   Pneumococcal Polysaccharide-23 07/30/2009   Td 11/01/2005, 11/10/2016   Zoster Recombinat (Shingrix) 07/21/2017, 11/03/2017     Objective: Vital Signs: BP 121/75 (BP Location: Left Arm, Patient Position: Sitting, Cuff Size: Normal)    Pulse 81    Resp 16    Ht _0  (1.626 m)    Wt 124 lb 6.4 oz (56.4 kg)    LMP 07/25/1998    BMI 21.35 kg/m    Physical Exam Vitals signs and nursing note reviewed.  Constitutional:      Appearance: She is well-developed.  HENT:     Head: Normocephalic and atraumatic.  Eyes:     Conjunctiva/sclera: Conjunctivae normal.  Neck:     Musculoskeletal: Normal range of motion.  Cardiovascular:     Rate and Rhythm: Normal rate and regular rhythm.     Heart sounds: Normal heart sounds.  Pulmonary:     Effort: Pulmonary effort is normal.  Breath sounds: Normal breath sounds.  Abdominal:     General: Bowel sounds are normal.     Palpations: Abdomen is soft.  Lymphadenopathy:     Cervical: No cervical adenopathy.  Skin:    General: Skin is warm and dry.     Capillary Refill: Capillary refill takes 2 to 3 seconds.     Comments: Scattered Telangiectasias  No digital ulcerations or signs of gangrene.  No cyanosis noted  Neurological:     Mental Status: She is alert and oriented to person, place, and time.  Psychiatric:        Behavior: Behavior normal.      Musculoskeletal Exam: C-spine limited range of motion.  Thoracic and lumbar spine good range of motion.  No midline spinal tenderness.  No SI joint tenderness.  Shoulder joints, elbow joints, wrist joints, MCPs, PIPs, DIPs good range of motion with no synovitis.  Left hip replacement has slightly limited range of motion.  Right hip has good range of motion with no discomfort.  She has mild tenderness over the right trochanteric bursa.  No warmth or effusion of bilateral knee joints.  Has bilateral knee crepitus.  Ankle joints have good range of motion no tenderness or inflammation at  this time.  CDAI Exam: CDAI Score: -- Patient Global: --; Provider Global: -- Swollen: --; Tender: -- Joint Exam   No joint exam has been documented for this visit   There is currently no information documented on the homunculus. Go to the Rheumatology activity and complete the homunculus joint exam.  Investigation: No additional findings.  Imaging: Dg Bone Density (dxa)  Result Date: 05/16/2019 EXAM: DUAL X-RAY ABSORPTIOMETRY (DXA) FOR BONE MINERAL DENSITY IMPRESSION: Referring Physician:  Yisroel Ramming, [place] Your patient completed a BMD test using Lunar IDXA DXA system ( analysis version: 16 ) manufactured by [employer]. Technologist: CG PATIENT: Name: JEANISE, DURFEY Patient ID: [id number] Birth Date: [birth year] Height: 64.0 in. Sex: Female Measured: 05/16/2019 Weight: 125.2 lbs. Indications: Caucasian, Estrogen Deficient, Family History of Osteoporosis, Height Loss (781.91), Nexium, Postmenopausal Fractures: None Treatments: Calcium (E943.0), Estradiol, Testosterone, Vitamin D (E933.5) ASSESSMENT: The BMD measured at Femur Total is 0.762 g/cm2 with a T-score of -2.0. This patient is considered osteopenic according to [facility]) criteria. There has been a statistically significant decrease in BMD of Right hip since prior exam dated 02/07/2017. The scan quality is good. Lumbar spine was not utilized due to exclusion on prior exam. Left femur was excluded due to surgical hardware. Patient does not meet criteria for FRAX due to treatments of Estradiol. Site Region Measured Date Measured Age YA BMD Significant CHANGE T-score Right Femur Total 05/16/2019 61.3 -2.0 0.762 g/cm2 * Right Femur Total 02/07/2017 59.0 -1.5 0.820 g/cm2 Left Forearm Radius 33% 05/16/2019 61.3 -1.2 0.783 g/cm2 Left Forearm Radius 33% 02/07/2017 59.0 -0.6 0.831 g/cm2 World Health Organization [facility]) criteria for post-menopausal, Caucasian Women: Normal       T-score at or above -1 SD Osteopenia    T-score between -1 and -2.5 SD Osteoporosis T-score at or below -2.5 SD RECOMMENDATION: 1. All patients should optimize calcium and vitamin D intake. 2. Consider FDA approved medical therapies in postmenopausal women and men aged 33 years and older, based on the following: a. A hip or vertebral (clinical or morphometric) fracture b. T- score < or = -2.5 at the femoral neck or spine after appropriate evaluation to exclude secondary causes c. Low bone mass (T-score between -1.0 and -2.5 at  the femoral neck or spine) and a 10 year probability of a hip fracture > or = 3% or a 10 year probability of a major osteoporosis-related fracture > or = 20% based on the [place]-adapted WHO algorithm d. Clinician judgment and/or patient preferences may indicate treatment for people with 10-year fracture probabilities above or below these levels FOLLOW-UP: Patients with diagnosis of osteoporosis or at high risk for fracture should have regular bone mineral density tests. For patients eligible for Medicare, routine testing is allowed once every 2 years. The testing frequency can be increased to one year for patients who have rapidly progressing disease, those who are receiving or discontinuing medical therapy to restore bone mass, or have additional risk factors. [facility] Radiology Electronically Signed   By: Lowella Grip III M.D.   On: 05/16/2019 13:14    Recent Labs: Lab Results  Component Value Date   WBC 9.3 05/28/2019   HGB 12.7 05/28/2019   PLT 342.0 05/28/2019   NA 136 05/28/2019   K 4.4 05/28/2019   CL 100 05/28/2019   CO2 30 05/28/2019   GLUCOSE 107 (H) 05/28/2019   BUN 11 05/28/2019   CREATININE 0.54 05/28/2019   BILITOT 0.3 05/28/2019   ALKPHOS 117 05/28/2019   AST 15 05/28/2019   ALT 8 05/28/2019   PROT 6.9 05/28/2019   ALBUMIN 4.6 05/28/2019   CALCIUM 9.2 05/28/2019   GFRAA >60 03/13/2019    Speciality Comments: No specialty comments available.  Procedures:  No procedures  performed Allergies: Inh [isoniazid], Cymbalta [duloxetine hcl], Nucynta [tapentadol], Silenor [doxepin hcl], Benzoin, Celebrex [celecoxib], Codeine phosphate, Lithium, Meperidine hcl, and Sulfamethoxazole   Assessment / Plan:     Visit Diagnoses: Fibromyalgia: She has generalized muscle aches and muscle tenderness due to fibromyalgia.  She has trapezius muscle tension and muscle tenderness bilaterally.  She has intermittent right trochanteric bursitis and uses Voltaren gel topically as needed for pain relief.  She continues to take Lyrica, Flexeril, and tramadol as prescribed.  She was encouraged to stay active and exercise on a regular basis.  Chronic pain syndrome: She continues have generalized muscle aches muscle tenderness due to fibromyalgia.  She is taking Lyrica 150 mg 2 tablets daily and Flexeril 10 mg 1 tablet 3 times daily as needed for muscle spasms.She is also taking tramadol 50 mg half tablet by mouth twice daily as needed for pain relief.  CFS (chronic fatigue syndrome): She has chronic fatigue related to insomnia.  She was encouraged to stay active and exercise on a regular basis  Primary osteoarthritis of both knees: She has good range of motion of bilateral knee joints.  She has bilateral knee crepitus.  No warmth or effusion was noted.  She had bilateral knee joint cortisone injections on 05/08/2019 which provided significant pain relief.    S/P hip replacement, left: She has slightly limited range of motion with no discomfort at this time.  Trochanteric bursitis of both hips: She has tenderness over the right trochanteric bursa and uses Voltaren gel topically as needed for pain relief.  She has no tenderness over the left trochanteric bursa.  She was encouraged to perform stretching exercises regularly.  [name] disease without gangrene:  She has been experiencing worsening symptoms of [name] over the past several weeks.  She was previously taking Norvasc 7.5 mg by mouth  daily, aspirin 81 mg by mouth daily, and using nitroglycerin ointment topically as needed.  She was evaluated on 05/17/2019 and was started on Imdur 30 mg 1 tablet  daily and advised to reduce Norvasc to 5 mg and to discontinue nitroglycerin ointment use.  She cannot tolerate taking Imdur due to severe headaches and nausea.  While taking Imdur she noticed significant improvement in her symptoms but discontinued several days ago.  She continues have intermittent symptoms of [name].  No digital ulcerations or signs of gangrene were noted.  We discussed using Vasculera but the patient declines due to cost.  She will continue on Norvasc, aspirin, and nitroglycerin ointment as needed.  She does not need any refills at this time.  She was advised to notify us if she develops any new or worsening symptoms. AVISE labs were reviewed with the patient today.  ANA was positive but negative titer.  Index -0.5.    DDD (degenerative disc disease), cervical: She has limited range of motion.  She has no symptoms of radiculopathy at this time.  DDD (degenerative disc disease), lumbar: She has chronic lower back pain.  Trigger finger, left middle finger: Resolved   Plantar fasciitis, bilateral:  Resolved   Other medical conditions are listed as follows:   H/O oral aphthous ulcers  Prediabetes  MDD (major depressive disorder), recurrent episode, moderate ([facility])  History of gastroesophageal reflux (GERD)  History of bipolar disorder  Hx of porphyria  Positive QuantiFERON-TB Gold test  Orders: No orders of the defined types were placed in this encounter.  No orders of the defined types were placed in this encounter.     Follow-Up Instructions: Return in about 3 months (around 09/04/2019) for [name] syndrome, Fibromyalgia, Osteoarthritis.   Ofilia Neas, PA-C  Note - This record has been created using Dragon software.  Chart creation errors have been sought, but may not always  have been  located. Such creation errors do not reflect on  the standard of medical care.

## 2019-05-29 NOTE — Telephone Encounter (Signed)
Erx

## 2019-05-30 ENCOUNTER — Other Ambulatory Visit: Payer: Self-pay

## 2019-05-30 ENCOUNTER — Telehealth: Payer: Self-pay

## 2019-05-30 DIAGNOSIS — K831 Obstruction of bile duct: Secondary | ICD-10-CM

## 2019-05-30 NOTE — Telephone Encounter (Signed)
Patient called back and stated she is claustrophobic and can not do a regular MRI machine. I called [facility] and their machine is larger and open on the ends. They will call patient and schedule there first available. Michela Pitcher they are backed-up a couple of weeks.

## 2019-06-04 ENCOUNTER — Encounter: Payer: Self-pay | Admitting: Physician Assistant

## 2019-06-04 ENCOUNTER — Ambulatory Visit (INDEPENDENT_AMBULATORY_CARE_PROVIDER_SITE_OTHER): Payer: Medicare Other | Admitting: Physician Assistant

## 2019-06-04 ENCOUNTER — Telehealth: Payer: Self-pay | Admitting: Gastroenterology

## 2019-06-04 ENCOUNTER — Other Ambulatory Visit: Payer: Self-pay

## 2019-06-04 VITALS — BP 121/75 | HR 81 | Resp 16 | Ht 64.0 in | Wt 124.4 lb

## 2019-06-04 DIAGNOSIS — G894 Chronic pain syndrome: Secondary | ICD-10-CM

## 2019-06-04 DIAGNOSIS — M722 Plantar fascial fibromatosis: Secondary | ICD-10-CM

## 2019-06-04 DIAGNOSIS — Z8719 Personal history of other diseases of the digestive system: Secondary | ICD-10-CM

## 2019-06-04 DIAGNOSIS — Z96642 Presence of left artificial hip joint: Secondary | ICD-10-CM

## 2019-06-04 DIAGNOSIS — Z8659 Personal history of other mental and behavioral disorders: Secondary | ICD-10-CM

## 2019-06-04 DIAGNOSIS — M170 Bilateral primary osteoarthritis of knee: Secondary | ICD-10-CM

## 2019-06-04 DIAGNOSIS — Z8639 Personal history of other endocrine, nutritional and metabolic disease: Secondary | ICD-10-CM

## 2019-06-04 DIAGNOSIS — M797 Fibromyalgia: Secondary | ICD-10-CM

## 2019-06-04 DIAGNOSIS — M5030 Other cervical disc degeneration, unspecified cervical region: Secondary | ICD-10-CM

## 2019-06-04 DIAGNOSIS — F331 Major depressive disorder, recurrent, moderate: Secondary | ICD-10-CM

## 2019-06-04 DIAGNOSIS — M7061 Trochanteric bursitis, right hip: Secondary | ICD-10-CM

## 2019-06-04 DIAGNOSIS — R7612 Nonspecific reaction to cell mediated immunity measurement of gamma interferon antigen response without active tuberculosis: Secondary | ICD-10-CM

## 2019-06-04 DIAGNOSIS — R5382 Chronic fatigue, unspecified: Secondary | ICD-10-CM

## 2019-06-04 DIAGNOSIS — M65332 Trigger finger, left middle finger: Secondary | ICD-10-CM

## 2019-06-04 DIAGNOSIS — M5136 Other intervertebral disc degeneration, lumbar region: Secondary | ICD-10-CM

## 2019-06-04 DIAGNOSIS — R7303 Prediabetes: Secondary | ICD-10-CM

## 2019-06-04 DIAGNOSIS — G9332 Myalgic encephalomyelitis/chronic fatigue syndrome: Secondary | ICD-10-CM

## 2019-06-04 DIAGNOSIS — I7300 Raynaud's syndrome without gangrene: Secondary | ICD-10-CM

## 2019-06-04 DIAGNOSIS — M7062 Trochanteric bursitis, left hip: Secondary | ICD-10-CM

## 2019-06-04 NOTE — Telephone Encounter (Signed)
Called and gave patient Dr. Lynne Leader recommendations for Zofran and no food or liquid 3-4 hours before bedtime( except sips of water) patient agrees

## 2019-06-04 NOTE — Telephone Encounter (Signed)
Proceed with MRCP at a [facility] or [facility] facility. Take Zofran hs in addition to prn. No food or beverages within 3-4 hours of bedtime except medications with minimal water.

## 2019-06-04 NOTE — Telephone Encounter (Signed)
[facility] and they don't do MRCP at their [place] facility. I called patient back and let her know. She did want Dr Fuller Plan to know she is still having episodes of vomiting during the night every few nights. It is bile

## 2019-06-05 ENCOUNTER — Encounter: Payer: Self-pay | Admitting: Family Medicine

## 2019-06-05 DIAGNOSIS — R768 Other specified abnormal immunological findings in serum: Secondary | ICD-10-CM | POA: Insufficient documentation

## 2019-06-07 ENCOUNTER — Ambulatory Visit (HOSPITAL_COMMUNITY): Payer: Medicare Other

## 2019-06-17 ENCOUNTER — Other Ambulatory Visit: Payer: Self-pay | Admitting: Family Medicine

## 2019-06-17 NOTE — Telephone Encounter (Signed)
Name of Medication: [place] Name of Pharmacy: [facility] or Written Date and Quantity: 05/22/19, #60 Last Office Visit and Type: 04/19/19, chronic pain mngmt Next Office Visit and Type: 07/09/19, CPE prt 2 Last Controlled Substance Agreement Date: 04/14/17 Last UDS:  04/19/19

## 2019-06-19 NOTE — Telephone Encounter (Signed)
Erx

## 2019-06-23 ENCOUNTER — Other Ambulatory Visit: Payer: Medicare Other

## 2019-06-24 ENCOUNTER — Other Ambulatory Visit: Payer: Self-pay | Admitting: Physician Assistant

## 2019-06-24 ENCOUNTER — Other Ambulatory Visit: Payer: Self-pay | Admitting: Family Medicine

## 2019-06-24 NOTE — Telephone Encounter (Signed)
Electronic refill request. Eszopiclone Last office visit:

## 2019-06-24 NOTE — Telephone Encounter (Signed)
Last RF 03/24/2019 Last appt 06/04/2019 Next appt 09/05/2019 No labs with Dr. Estanislado Pandy

## 2019-06-24 NOTE — Telephone Encounter (Signed)
Name of Medication: Eszopiclone Name of Pharmacy: [facility] or Written Date and Quantity: 03/25/19, #90 Last Office Visit and Type: 04/19/19, chronic pain mngmt Next Office Visit and Type: 07/09/19, CPE prt 2 Last Controlled Substance Agreement Date: 04/19/19 Last UDS: 04/19/19

## 2019-06-25 ENCOUNTER — Ambulatory Visit: Payer: Medicare Other | Admitting: Physician Assistant

## 2019-06-25 NOTE — Telephone Encounter (Signed)
ERx 

## 2019-06-26 ENCOUNTER — Encounter: Payer: Self-pay | Admitting: Orthopaedic Surgery

## 2019-06-27 ENCOUNTER — Ambulatory Visit
Admission: RE | Admit: 2019-06-27 | Discharge: 2019-06-27 | Disposition: A | Discharge status: Home / Self Care | Payer: Medicare Other | Source: Ambulatory Visit | Attending: Physician Assistant | Admitting: Physician Assistant

## 2019-06-27 DIAGNOSIS — K831 Obstruction of bile duct: Secondary | ICD-10-CM

## 2019-06-27 MED ORDER — GADOBENATE DIMEGLUMINE 529 MG/ML IV SOLN
12.0000 mL | Freq: Once | INTRAVENOUS | Status: AC | PRN
  Administered 2019-06-27: 12 mL via INTRAVENOUS

## 2019-07-01 ENCOUNTER — Other Ambulatory Visit: Payer: Self-pay | Admitting: Family Medicine

## 2019-07-01 DIAGNOSIS — E785 Hyperlipidemia, unspecified: Secondary | ICD-10-CM

## 2019-07-01 DIAGNOSIS — E611 Iron deficiency: Secondary | ICD-10-CM

## 2019-07-01 DIAGNOSIS — R7303 Prediabetes: Secondary | ICD-10-CM

## 2019-07-01 DIAGNOSIS — R946 Abnormal results of thyroid function studies: Secondary | ICD-10-CM

## 2019-07-01 DIAGNOSIS — K831 Obstruction of bile duct: Secondary | ICD-10-CM

## 2019-07-02 ENCOUNTER — Ambulatory Visit (INDEPENDENT_AMBULATORY_CARE_PROVIDER_SITE_OTHER): Payer: Medicare Other

## 2019-07-02 ENCOUNTER — Other Ambulatory Visit (INDEPENDENT_AMBULATORY_CARE_PROVIDER_SITE_OTHER): Payer: Medicare Other

## 2019-07-02 ENCOUNTER — Ambulatory Visit: Payer: Medicare Other

## 2019-07-02 ENCOUNTER — Other Ambulatory Visit: Payer: Self-pay

## 2019-07-02 VITALS — BP 118/78 | Wt 124.0 lb

## 2019-07-02 DIAGNOSIS — E611 Iron deficiency: Secondary | ICD-10-CM | POA: Diagnosis not present

## 2019-07-02 DIAGNOSIS — Z0000 Encounter for general adult medical examination without abnormal findings: Secondary | ICD-10-CM

## 2019-07-02 DIAGNOSIS — K831 Obstruction of bile duct: Secondary | ICD-10-CM

## 2019-07-02 DIAGNOSIS — R946 Abnormal results of thyroid function studies: Secondary | ICD-10-CM | POA: Diagnosis not present

## 2019-07-02 DIAGNOSIS — E785 Hyperlipidemia, unspecified: Secondary | ICD-10-CM

## 2019-07-02 DIAGNOSIS — R7303 Prediabetes: Secondary | ICD-10-CM

## 2019-07-02 LAB — LIPID PANEL
Cholesterol: 219 mg/dL — ABNORMAL HIGH (ref 0–200)
HDL: 67.9 mg/dL (ref >39.00)
LDL Cholesterol: 120 mg/dL — ABNORMAL HIGH (ref 0–99)
NonHDL: 151.24
Total CHOL/HDL Ratio: 3
Triglycerides: 157 mg/dL — ABNORMAL HIGH (ref 0.0–149.0)
VLDL: 31.4 mg/dL (ref 0.0–40.0)

## 2019-07-02 LAB — COMPREHENSIVE METABOLIC PANEL
ALT: 8 U/L (ref 0–35)
AST: 15 U/L (ref 0–37)
Albumin: 4.5 g/dL (ref 3.5–5.2)
Alkaline Phosphatase: 109 U/L (ref 39–117)
BUN: 8 mg/dL (ref 6–23)
CO2: 31 mEq/L (ref 19–32)
Calcium: 9.7 mg/dL (ref 8.4–10.5)
Chloride: 97 mEq/L (ref 96–112)
Creatinine, Ser: 0.55 mg/dL (ref 0.40–1.20)
GFR: 112.2 mL/min (ref >60.00)
Glucose, Bld: 96 mg/dL (ref 70–99)
Potassium: 4.4 mEq/L (ref 3.5–5.1)
Sodium: 134 mEq/L — ABNORMAL LOW (ref 135–145)
Total Bilirubin: 0.3 mg/dL (ref 0.2–1.2)
Total Protein: 6.6 g/dL (ref 6.0–8.3)

## 2019-07-02 LAB — IBC PANEL
Iron: 56 ug/dL (ref 42–145)
Saturation Ratios: 14 % — ABNORMAL LOW (ref 20.0–50.0)
Transferrin: 285 mg/dL (ref 212.0–360.0)

## 2019-07-02 LAB — HEMOGLOBIN A1C: Hgb A1c MFr Bld: 6.1 % (ref 4.6–6.5)

## 2019-07-02 LAB — TSH: TSH: 3.41 u[IU]/mL (ref 0.35–4.50)

## 2019-07-02 LAB — FERRITIN: Ferritin: 62.1 ng/mL (ref 10.0–291.0)

## 2019-07-02 NOTE — Progress Notes (Signed)
Subjective:   Rachel Duran is a 63 y.o. female who presents for Medicare Annual (Subsequent) preventive examination.  Review of Systems: N/A   This visit is being conducted through telemedicine via telephone at the nurse health advisor's home address due to the COVID-19 pandemic. This patient has given me verbal consent via doximity to conduct this visit, patient states they are participating from their home address. Patient and myself are on the telephone call. There is no referral for this visit. Some vital signs may be absent or patient reported.    Patient identification: identified by name, DOB, and current address   Cardiac Risk Factors include: advanced age (>19mn, >>3women);dyslipidemia     Objective:     Vitals: BP 118/78    Wt 124 lb (56.2 kg)    LMP 07/25/1998    BMI 21.28 kg/m   Body mass index is 21.28 kg/m.  Advanced Directives 07/02/2019 03/12/2019 03/04/2019 09/27/2018 06/28/2018 06/21/2017 01/17/2017  Does Patient Have a Medical Advance Directive? Yes Yes Yes Yes Yes Yes Yes  Type of AParamedicof [place]iver[place]Living will [place]Living will [place]camoreLiving will HVal Verde ParkLiving will HMorganLiving will HBow MarLiving will Living will;Healthcare Power of Attorney  Does patient want to make changes to medical advance directive? - No - Patient declined No - Patient declined - - - -  Copy of [place]ag[place]ie[place] Chart? Yes - validated most recent copy scanned in chart (See row information) No - copy requested No - copy requested No - copy requested Yes - validated most recent copy scanned in chart (See row information) No - copy requested -  Pre-existing out of facility DNR order (yellow form or pink MOST form) - - - - - - -    Tobacco Social History   Tobacco Use  Smoking Status Never Smoker  Smokeless Tobacco Never Used       Counseling given: Not Answered   Clinical Intake:  Pre-visit preparation completed: Yes  Pain : 0-10 Pain Score: 6  Pain Type: Chronic pain Pain Location: Back Pain Orientation: Lower Pain Descriptors / Indicators: Aching Pain Onset: More than a month ago Pain Frequency: Intermittent     Nutritional Risks: Nausea/ vomitting/ diarrhea(nausea frequently (takes zofran)) Diabetes: No  How often do you need to have someone help you when you read instructions, pamphlets, or other written materials from your doctor or pharmacy?: 1 - Never What is the last grade level you completed in school?: some college  Interpreter Needed?: No  Information entered by :: CJohnson, LPN  Past Medical History:  Diagnosis Date   Abdominal pain last 4 months   and nausea also   Allergy    Anemia    history of   Anxiety    Bipolar disorder (HBond    atpical bipolar disorder   Cervical disc disease limited rom turning to left   hx. C6- C7 -hx. past fusion(bone graft used)   Cholecystitis    Chronic pain    DDD (degenerative disc disease), lumbar 09/2015   dextroscoliosis with multilevel DDD and facet arthrosis most notable for R foraminal disc protrusion L4/5 producing severe R neural foraminal stenosis abutting R L4 nerve root, moderate spinal canal and mild lat recesss and R neural foraminal stenosis L3/4 (MRI)   Depression    bipolar depression   Disorders of porphyrin metabolism    Felon of finger of left hand  11/10/2016   Fibromyalgia    GERD (gastroesophageal reflux disease)    Headache    occasionally   Internal hemorrhoids    Interstitial cystitis 06-06-12   hx.   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 (Dr Baird Cancer retinologist)   Past Surgical History:  Procedure Laterality Date   ANTERIOR CERVICAL DECOMP/DISCECTOMY FUSION  2004   C5/6, C6/7   ANTERIOR CERVICAL DECOMP/DISCECTOMY FUSION  02/2016   C3/4, C4/5 with plating [name])   AUGMENTATION MAMMAPLASTY Bilateral 03/25/2010   BREAST ENHANCEMENT SURGERY  2010   BREAST IMPLANT EXCHANGE  10/2014   exchange saline implants, B mastopexy/capsulorraphy ([facility])   BUNIONECTOMY Bilateral yrs ago   Guinda   x 1   CHOLECYSTECTOMY  06/11/2012   Procedure: LAPAROSCOPIC CHOLECYSTECTOMY WITH INTRAOPERATIVE CHOLANGIOGRAM;  Surgeon: [name], MD,FACS;  Location: [facility];  Service: General;  Laterality: N/A;   COLONOSCOPY  02/2018   done for positive cologuard - WNL, rpt 10 yrs Fuller Plan)   CYSTOSCOPY     ERCP  05/22/2012   Procedure: ENDOSCOPIC RETROGRADE CHOLANGIOPANCREATOGRAPHY (ERCP);  Surgeon: [name], MD,FACG;  Location: [facility];  Service: Endoscopy;  Laterality: N/A;   ERCP N/A 09/17/2013   Procedure: ENDOSCOPIC RETROGRADE CHOLANGIOPANCREATOGRAPHY (ERCP);  Surgeon: [name], MD;  Location: [facility];  Service: Endoscopy;  Laterality: N/A;   ERCP N/A 09/27/2018   Procedure: ENDOSCOPIC RETROGRADE CHOLANGIOPANCREATOGRAPHY (ERCP);  Surgeon: [name], MD;  Location: [facility];  Service: Endoscopy;  Laterality: N/A;   ESOPHAGOGASTRODUODENOSCOPY  09/2016   WNL. esophagus dilated Fuller Plan)   HEMORRHOID BANDING  09-23-13   --Dr. Greer Pickerel   HERNIA REPAIR     inguinal   HYSTEROSCOPY W/ ENDOMETRIAL ABLATION     NASAL SINUS SURGERY     x5   REMOVAL OF STONES  09/27/2018   Procedure: REMOVAL OF STONES;  Surgeon: [name], MD;  Location: [facility];  Service: Endoscopy;;   SPHINCTEROTOMY  09/27/2018   Procedure: SPHINCTEROTOMY;  Surgeon:  [name], MD;  Location: [facility];  Service: Endoscopy;;   TOTAL HIP ARTHROPLASTY Left 03/12/2019   Procedure: LEFT TOTAL HIP ARTHROPLASTY ANTERIOR APPROACH;  [name], [name], MD)   UPPER GASTROINTESTINAL ENDOSCOPY     Family History  Problem Relation Age of Onset   CAD Father 61       MI, nonsmoker   Esophageal cancer Father 15   Stomach cancer Father    Scleroderma Mother    Hypertension Mother    Esophageal cancer Paternal Grandfather    Stomach cancer Paternal Grandfather    Diabetes Maternal Grandmother    Arthritis Brother    Stroke Neg Hx    Colon cancer Neg Hx    Rectal cancer Neg Hx    Social History   Socioeconomic History   Marital status: Married    Spouse name: Not on file   Number of children: Not on file   Years of education: Not on file   Highest education level: Not on file  Occupational History   Occupation: retired    Fish farm manager: UNEMPLOYED  Scientist, 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 all   Food insecurity    Worry: Never true    Inability: Never true   Transportation needs    Medical: No    Non-medical: No  Tobacco Use   Smoking status: Never Smoker   Smokeless tobacco: Never Used  Substance and Sexual Activity   Alcohol use: No    Alcohol/week: 0.0 standard drinks   Drug use: No   Sexual activity: Yes    Partners: Male    Birth control/protection: Post-menopausal    Comment: vasectomy  Lifestyle   Physical activity    Days per week: 0 days    Minutes per session: 0 min   Stress: Rather much  Relationships   Social connections    Talks on phone: Not on file    Gets together: Not on file    Attends religious service: Not on file    Active member of club or organization: Not on file    Attends meetings of clubs or organizations: Not on file    Relationship status: Not on file  Other Topics Concern   Not on file  Social History Narrative   Lives with husband and dog    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    AMBULATORY NON FORMULARY MEDICATION Medication Name: Gi cocktail 5-10 ml every 4-6 hours as needed for pain.   amLODipine (NORVASC) 5 MG tablet Take 1 tablet (5 mg total) by mouth daily. Take 1 and a half tablet (7.5 mg) by mouth daily (Patient taking differently: Take 5 mg by mouth daily. )   amphetamine-dextroamphetamine (ADDERALL XR) 30 MG 24 hr capsule TAKE 1 CAPSULE BY MOUTH EVERY MORNING   ARIPiprazole (ABILIFY) 5 MG tablet Take 5 mg by mouth at bedtime.    aspirin 81 MG chewable tablet Chew 1 tablet (81 mg total) by mouth 2 (two) times daily.   calcium-vitamin D (OSCAL WITH D) 500-200 MG-UNIT tablet Take 1 tablet by mouth 2 (two) times daily.   cyanocobalamin (,VITAMIN B-12,) 1000 MCG/ML injection INJECT 1 ML IM EVERY 21 DAYS   cyclobenzaprine (FLEXERIL) 5 MG tablet TAKE ONE TABLET BY MOUTH 3 TIMES DAILY AS NEEDED FOR MUSCLE SPASMS   diclofenac sodium (VOLTAREN) 1 % GEL Apply 2-4 grams to affected joint up to 4 times daily.   Estradiol (YUVAFEM) 10 MCG TABS vaginal tablet Place 1 tablet (10 mcg total) vaginally 3 (three) times a week. Please keep on file. (Patient taking differently: Place 1 tablet vaginally See admin instructions. Place 1 tablet vaginally Tuesday, Thursday and Saturday.)   Eszopiclone 3 MG TABS TAKE ONE TABLET AT BEDTIME   ferrous sulfate 325 (65 FE) MG tablet Take 325 mg by mouth every Monday, Wednesday, and Friday.    fluticasone (FLONASE) 50 MCG/ACT nasal spray Place 2 sprays into both nostrils daily as needed (seasonal allergies). (Patient taking differently:  Place 1 spray into both nostrils daily as needed for allergies. )   KADIAN 40 MG CP24 TAKE 1  CAPSULE TWICE DAILY   lamoTRIgine (LAMICTAL) 100 MG tablet Take 100 mg by mouth daily.   LINZESS 290 MCG CAPS capsule TAKE 1 CAPSULE BY MOUTH EVERY DAY   methocarbamol (ROBAXIN) 500 MG tablet Take 1 tablet (500 mg total) by mouth every 6 (six) hours as needed for muscle spasms.   naloxegol oxalate (MOVANTIK) 12.5 MG TABS tablet Take 1 tablet (12.5 mg total) by mouth daily. (Patient taking differently: Take 12.5 mg by mouth daily. As needed if Linzess is not enough)   NONFORMULARY OR COMPOUNDED ITEM Testosterone propionate 2% in white petrolatum, apply small amount once daily for 5 days per week. (Patient taking differently: Apply 1 application topically See admin instructions. Testosterone propionate 2% in white petrolatum, Apply small amount topically at bedtime on Tuesday, Thursday and Saturday.)   ondansetron (ZOFRAN ODT) 4 MG disintegrating tablet Take 1 tablet (4 mg total) by mouth every 8 (eight) hours as needed for nausea or vomiting.   ondansetron (ZOFRAN) 4 MG tablet TAKE ONE TABLET EVERY 8 HOURS AS NEEDED FOR NAUSEA OR VOMITING   pantoprazole (PROTONIX) 40 MG tablet Take 1 tablet (40 mg total) by mouth 2 (two) times daily before a meal.   pentosan polysulfate (ELMIRON) 100 MG capsule Take 200 mg by mouth 2 (two) times daily.    polyethylene glycol (MIRALAX) 17 g packet Take 17 g by mouth 3 (three) times daily.    pregabalin (LYRICA) 150 MG capsule TAKE 1 CAPSULE BY MOUTH TWICE DAILY   senna (SENOKOT) 8.6 MG tablet Take 2 tablets by mouth daily as needed.    SF 5000 PLUS 1.1 % CREA dental cream Place 1 application onto teeth at bedtime.    traMADol (ULTRAM) 50 MG tablet TAKE ONE TABLET BY MOUTH TWICE DAILY AS NEEDED (Patient taking differently: Take 25 mg by mouth 2 (two) times daily as needed for moderate pain. )   ursodiol (ACTIGALL) 300 MG capsule Take 1 capsule (300 mg total) by  mouth 2 (two) times daily.   No facility-administered encounter medications on file as of 07/02/2019.     Activities of Daily Living In your present state of health, do you have any difficulty performing the following activities: 07/02/2019 03/12/2019  Hearing? N N  Vision? Y Y  Comment some vision issues -  Difficulty concentrating or making decisions? N N  Walking or climbing stairs? Y Y  Comment back and hip pain -  Dressing or bathing? N N  Doing errands, shopping? N N  Preparing Food and eating ? N -  Using the Toilet? N -  In the past six months, have you accidently leaked urine? N -  Do you have problems with loss of bowel control? N -  Managing your Medications? N -  Managing your Finances? N -  Housekeeping or managing your Housekeeping? N -  Some recent data might be hidden    Patient Care Team: Ria Bush, MD as PCP - General (Family Medicine) [name], MD as Consulting Physician (Gastroenterology) Jari Pigg, MD as Consulting Physician (Dermatology) Yisroel Ramming, Everardo All, MD as Consulting Physician (Obstetrics and Gynecology) Sherlynn Stalls, MD as Consulting Physician (Ophthalmology) Luberta Mutter, MD as Consulting Physician (Ophthalmology) Amalia Hailey Marily Lente, MD as Referring Physician (Urology) Bo Merino, MD as Consulting Physician (Rheumatology) Newman Pies, MD as Consulting Physician (Neurosurgery)    Assessment:   This is a routine wellness examination for [facility].  Exercise Activities and Dietary recommendations Current Exercise Habits: Home exercise routine, Type of exercise:  walking;stretching, Time (Minutes): 30, Frequency (Times/Week): 4, Weekly Exercise (Minutes/Week): 120, Intensity: Mild, Exercise limited by: None identified  Goals     Increase physical activity     Starting 06/28/2018, I will continue to exercise 30-40 minutes 4 days per week.      Patient Stated     07/02/2019, I will try to start exercising  more by doing some yoga in the future when my hip pain improves.        Fall Risk Fall Risk  07/02/2019 06/28/2018 06/21/2017 06/20/2016 06/15/2015  Falls in the past year? 1 1 No No No  Comment tripped and fell fell walking down stairs - - -  Number falls in past yr: 0 0 - - -  Injury with Fall? 1 1 - - -  Comment hurt her hip bruising to hip region - - -  Risk for fall due to : Medication side effect - - - -  Follow up Falls evaluation completed;Falls prevention discussed - - - -   Is the patient's home free of loose throw rugs in walkways, pet beds, electrical cords, etc?   yes      Grab bars in the bathroom? no      Handrails on the stairs?   yes      Adequate lighting?   yes  Timed Get Up and Go performed: N/A  Depression Screen PHQ 2/9 Scores 07/02/2019 06/28/2018 06/21/2017 06/20/2016  PHQ - 2 Score 0 3 3 3   PHQ- 9 Score 0 12 9 16      Cognitive Function MMSE - Mini Mental State Exam 07/02/2019 06/28/2018 06/21/2017  Orientation to time 5 5 5   Orientation to Place 5 5 5   Registration 3 3 3   Attention/ Calculation 5 0 0  Recall 3 3 3   Language- name 2 objects - 0 0  Language- repeat 1 1 1   Language- follow 3 step command - 3 3  Language- read & follow direction - 0 0  Write a sentence - 0 0  Copy design - 0 0  Total score - 20 20  Mini Cog  Mini-Cog screen was completed. Maximum score is 22. A value of 0 denotes this part of the MMSE was not completed or the patient failed this part of the Mini-Cog screening.       Immunization History  Administered Date(s) Administered   Influenza Split 05/04/2011, 04/04/2012   Influenza Whole 05/25/2007, 04/28/2008, 04/30/2009, 03/26/2010   Influenza,inj,Quad PF,6+ Mos 04/16/2015, 04/15/2016, 05/16/2017, 04/10/2018, 04/19/2019   Influenza-Unspecified 03/25/2014   MMR 11/28/2017   Pneumococcal Conjugate-13 08/02/2013   Pneumococcal Polysaccharide-23 07/30/2009   Td 11/01/2005, 11/10/2016   Zoster Recombinat  (Shingrix) 07/21/2017, 11/03/2017    Qualifies for Shingles Vaccine?Completed series  Screening Tests Health Maintenance  Topic Date Due   HIV Screening  06/21/2024 (Originally 01/22/1971)   DTaP/Tdap/Td (1 - Tdap) 11/11/2026 (Originally 01/22/1975)   MAMMOGRAM  12/23/2020   PAP SMEAR-Modifier  01/30/2022   TETANUS/TDAP  11/11/2026   COLONOSCOPY  02/23/2028   INFLUENZA VACCINE  Completed   Hepatitis C Screening  Completed    Cancer Screenings: Lung: Low Dose CT Chest recommended if Age 35-80 years, 30 pack-year currently smoking OR have quit w/in 15years. Patient does not qualify. Breast:  Up to date on Mammogram? Yes, completed 12/24/2018  Up to date of Bone Density/Dexa? Yes, completed 05/16/2019 Colorectal: completed 02/22/2018  Additional Screenings:  Hepatitis C Screening: 07/16/2015     Plan:   Patient will try to start  exercising more by doing some yoga in the future.   I have personally reviewed and noted the following in the patients chart:    Medical and social history  Use of alcohol, tobacco or illicit drugs   Current medications and supplements  Functional ability and status  Nutritional status  Physical activity  Advanced directives  List of other physicians  Hospitalizations, surgeries, and ER visits in previous 12 months  Vitals  Screenings to include cognitive, depression, and falls  Referrals and appointments  In addition, I have reviewed and discussed with patient certain preventive protocols, quality metrics, and best practice recommendations. A written personalized care plan for preventive services as well as general preventive health recommendations were provided to patient.     Andrez Grime, LPN  [id number]/01/[id number]

## 2019-07-02 NOTE — Progress Notes (Signed)
PCP notes:  Health Maintenance: none   Abnormal Screenings: none   Patient concerns: none   Nurse concerns: none   Next PCP appt.: 07/02/2019 @ 11:30 am

## 2019-07-02 NOTE — Patient Instructions (Signed)
[name] , Thank you for taking time to come for your Medicare Wellness Visit. I appreciate your ongoing commitment to your health goals. Please review the following plan we discussed and let me know if I can assist you in the future.   Screening recommendations/referrals: Colonoscopy: Up to date, completed 02/22/2018 Mammogram: Up to date, completed 12/24/2018 Bone Density: Up to date, completed 05/16/2019 Recommended yearly ophthalmology/optometry visit for glaucoma screening and checkup Recommended yearly dental visit for hygiene and checkup  Vaccinations: Influenza vaccine: Up to date, completed 04/19/2019 Pneumococcal vaccine: Completed series Tdap vaccine: Up to date, completed 11/10/2016 Shingles vaccine: Completed series    Advanced directives: copy in chart  Conditions/risks identified: dyslipidemia  Next appointment: 07/09/2019 @ 11:30 am   Preventive Care 40-64 Years, Female Preventive care refers to lifestyle choices and visits with your health care provider that can promote health and wellness. What does preventive care include?  A yearly physical exam. This is also called an annual well check.  Dental exams once or twice a year.  Routine eye exams. Ask your health care provider how often you should have your eyes checked.  Personal lifestyle choices, including:  Daily care of your teeth and gums.  Regular physical activity.  Eating a healthy diet.  Avoiding tobacco and drug use.  Limiting alcohol use.  Practicing safe sex.  Taking low-dose aspirin daily starting at age 21.  Taking vitamin and mineral supplements as recommended by your health care provider. What happens during an annual well check? The services and screenings done by your health care provider during your annual well check will depend on your age, overall health, lifestyle risk factors, and family history of disease. Counseling  Your health care provider may ask you questions about your:   Alcohol use.  Tobacco use.  Drug use.  Emotional well-being.  Home and relationship well-being.  Sexual activity.  Eating habits.  Work and work Statistician.  Method of birth control.  Menstrual cycle.  Pregnancy history. Screening  You may have the following tests or measurements:  Height, weight, and BMI.  Blood pressure.  Lipid and cholesterol levels. These may be checked every 5 years, or more frequently if you are over 51 years old.  Skin check.  Lung cancer screening. You may have this screening every year starting at age 55 if you have a 30-pack-year history of smoking and currently smoke or have quit within the past 15 years.  Fecal occult blood test (FOBT) of the stool. You may have this test every year starting at age 68.  Flexible sigmoidoscopy or colonoscopy. You may have a sigmoidoscopy every 5 years or a colonoscopy every 10 years starting at age 2.  Hepatitis C blood test.  Hepatitis B blood test.  Sexually transmitted disease (STD) testing.  Diabetes screening. This is done by checking your blood sugar (glucose) after you have not eaten for a while (fasting). You may have this done every 1-3 years.  Mammogram. This may be done every 1-2 years. Talk to your health care provider about when you should start having regular mammograms. This may depend on whether you have a family history of breast cancer.  BRCA-related cancer screening. This may be done if you have a family history of breast, ovarian, tubal, or peritoneal cancers.  Pelvic exam and Pap test. This may be done every 3 years starting at age 86. Starting at age 27, this may be done every 5 years if you have a Pap test in combination  with an HPV test.  Bone density scan. This is done to screen for osteoporosis. You may have this scan if you are at high risk for osteoporosis. Discuss your test results, treatment options, and if necessary, the need for more tests with your health care  provider. Vaccines  Your health care provider may recommend certain vaccines, such as:  Influenza vaccine. This is recommended every year.  Tetanus, diphtheria, and acellular pertussis (Tdap, Td) vaccine. You may need a Td booster every 10 years.  Zoster vaccine. You may need this after age 29.  Pneumococcal 13-valent conjugate (PCV13) vaccine. You may need this if you have certain conditions and were not previously vaccinated.  Pneumococcal polysaccharide (PPSV23) vaccine. You may need one or two doses if you smoke cigarettes or if you have certain conditions. Talk to your health care provider about which screenings and vaccines you need and how often you need them. This information is not intended to replace advice given to you by your health care provider. Make sure you discuss any questions you have with your health care provider. Document Released: 08/07/2015 Document Revised: 03/30/2016 Document Reviewed: 05/12/2015 Elsevier Interactive Patient Education  2017 [employer] Prevention in the Home Falls can cause injuries. They can happen to people of all ages. There are many things you can do to make your home safe and to help prevent falls. What can I do on the outside of my home?  Regularly fix the edges of walkways and driveways and fix any cracks.  Remove anything that might make you trip as you walk through a door, such as a raised step or threshold.  Trim any bushes or trees on the path to your home.  Use bright outdoor lighting.  Clear any walking paths of anything that might make someone trip, such as rocks or tools.  Regularly check to see if handrails are loose or broken. Make sure that both sides of any steps have handrails.  Any raised decks and porches should have guardrails on the edges.  Have any leaves, snow, or ice cleared regularly.  Use sand or salt on walking paths during winter.  Clean up any spills in your garage right away. This includes  oil or grease spills. What can I do in the bathroom?  Use night lights.  Install grab bars by the toilet and in the tub and shower. Do not use towel bars as grab bars.  Use non-skid mats or decals in the tub or shower.  If you need to sit down in the shower, use a plastic, non-slip stool.  Keep the floor dry. Clean up any water that spills on the floor as soon as it happens.  Remove soap buildup in the tub or shower regularly.  Attach bath mats securely with double-sided non-slip rug tape.  Do not have throw rugs and other things on the floor that can make you trip. What can I do in the bedroom?  Use night lights.  Make sure that you have a light by your bed that is easy to reach.  Do not use any sheets or blankets that are too big for your bed. They should not hang down onto the floor.  Have a firm chair that has side arms. You can use this for support while you get dressed.  Do not have throw rugs and other things on the floor that can make you trip. What can I do in the kitchen?  Clean up any spills right  away.  Avoid walking on wet floors.  Keep items that you use a lot in easy-to-reach places.  If you need to reach something above you, use a strong step stool that has a grab bar.  Keep electrical cords out of the way.  Do not use floor polish or wax that makes floors slippery. If you must use wax, use non-skid floor wax.  Do not have throw rugs and other things on the floor that can make you trip. What can I do with my stairs?  Do not leave any items on the stairs.  Make sure that there are handrails on both sides of the stairs and use them. Fix handrails that are broken or loose. Make sure that handrails are as long as the stairways.  Check any carpeting to make sure that it is firmly attached to the stairs. Fix any carpet that is loose or worn.  Avoid having throw rugs at the top or bottom of the stairs. If you do have throw rugs, attach them to the floor  with carpet tape.  Make sure that you have a light switch at the top of the stairs and the bottom of the stairs. If you do not have them, ask someone to add them for you. What else can I do to help prevent falls?  Wear shoes that:  Do not have high heels.  Have rubber bottoms.  Are comfortable and fit you well.  Are closed at the toe. Do not wear sandals.  If you use a stepladder:  Make sure that it is fully opened. Do not climb a closed stepladder.  Make sure that both sides of the stepladder are locked into place.  Ask someone to hold it for you, if possible.  Clearly mark and make sure that you can see:  Any grab bars or handrails.  First and last steps.  Where the edge of each step is.  Use tools that help you move around (mobility aids) if they are needed. These include:  Canes.  Walkers.  Scooters.  Crutches.  Turn on the lights when you go into a dark area. Replace any light bulbs as soon as they burn out.  Set up your furniture so you have a clear path. Avoid moving your furniture around.  If any of your floors are uneven, fix them.  If there are any pets around you, be aware of where they are.  Review your medicines with your doctor. Some medicines can make you feel dizzy. This can increase your chance of falling. Ask your doctor what other things that you can do to help prevent falls. This information is not intended to replace advice given to you by your health care provider. Make sure you discuss any questions you have with your health care provider. Document Released: 05/07/2009 Document Revised: 12/17/2015 Document Reviewed: 08/15/2014 Elsevier Interactive Patient Education  2017 [employer].

## 2019-07-09 ENCOUNTER — Other Ambulatory Visit: Payer: Self-pay

## 2019-07-09 ENCOUNTER — Encounter: Payer: Self-pay | Admitting: Family Medicine

## 2019-07-09 ENCOUNTER — Ambulatory Visit (INDEPENDENT_AMBULATORY_CARE_PROVIDER_SITE_OTHER): Payer: Medicare Other | Admitting: Family Medicine

## 2019-07-09 ENCOUNTER — Telehealth: Payer: Self-pay | Admitting: Family Medicine

## 2019-07-09 VITALS — BP 120/80 | HR 92 | Temp 97.8°F | Ht 63.5 in | Wt 119.1 lb

## 2019-07-09 DIAGNOSIS — G9332 Myalgic encephalomyelitis/chronic fatigue syndrome: Secondary | ICD-10-CM

## 2019-07-09 DIAGNOSIS — R946 Abnormal results of thyroid function studies: Secondary | ICD-10-CM | POA: Diagnosis not present

## 2019-07-09 DIAGNOSIS — H3553 Other dystrophies primarily involving the sensory retina: Secondary | ICD-10-CM

## 2019-07-09 DIAGNOSIS — G8929 Other chronic pain: Secondary | ICD-10-CM

## 2019-07-09 DIAGNOSIS — K5903 Drug induced constipation: Secondary | ICD-10-CM

## 2019-07-09 DIAGNOSIS — I7300 Raynaud's syndrome without gangrene: Secondary | ICD-10-CM

## 2019-07-09 DIAGNOSIS — F5104 Psychophysiologic insomnia: Secondary | ICD-10-CM

## 2019-07-09 DIAGNOSIS — E611 Iron deficiency: Secondary | ICD-10-CM

## 2019-07-09 DIAGNOSIS — R5382 Chronic fatigue, unspecified: Secondary | ICD-10-CM

## 2019-07-09 DIAGNOSIS — K219 Gastro-esophageal reflux disease without esophagitis: Secondary | ICD-10-CM

## 2019-07-09 DIAGNOSIS — E785 Hyperlipidemia, unspecified: Secondary | ICD-10-CM

## 2019-07-09 DIAGNOSIS — F331 Major depressive disorder, recurrent, moderate: Secondary | ICD-10-CM

## 2019-07-09 DIAGNOSIS — K831 Obstruction of bile duct: Secondary | ICD-10-CM

## 2019-07-09 DIAGNOSIS — Z0000 Encounter for general adult medical examination without abnormal findings: Secondary | ICD-10-CM

## 2019-07-09 DIAGNOSIS — M5010 Cervical disc disorder with radiculopathy, unspecified cervical region: Secondary | ICD-10-CM

## 2019-07-09 DIAGNOSIS — G894 Chronic pain syndrome: Secondary | ICD-10-CM

## 2019-07-09 DIAGNOSIS — N3010 Interstitial cystitis (chronic) without hematuria: Secondary | ICD-10-CM

## 2019-07-09 DIAGNOSIS — R7303 Prediabetes: Secondary | ICD-10-CM

## 2019-07-09 NOTE — Telephone Encounter (Signed)
Noted  

## 2019-07-09 NOTE — Assessment & Plan Note (Signed)
Preventative protocols reviewed and updated unless pt declined. Discussed healthy diet and lifestyle.  

## 2019-07-09 NOTE — Assessment & Plan Note (Addendum)
Chronic, deteriorated. Off meds. Possible worsening from large ice cream bowl daily - she will work on this. ASCVD risk remains low.  The 10-year ASCVD risk score Mikey Bussing DC Brooke Bonito., et al., 2013) is: 3%   Values used to calculate the score:     Age: 63 years     Sex: Female     Is Non-Hispanic African American: No     Diabetic: No     Tobacco smoker: No     Systolic Blood Pressure: 123456 mmHg     Is BP treated: No     HDL Cholesterol: 67.9 mg/dL     Total Cholesterol: 219 mg/dL

## 2019-07-09 NOTE — Assessment & Plan Note (Signed)
Tried and failed multiple other treatment options.

## 2019-07-09 NOTE — Progress Notes (Signed)
This visit was conducted in person.  BP 120/80 (BP Location: Left Arm, Patient Position: Sitting, Cuff Size: Normal)   Pulse 92   Temp 97.8 F (36.6 C) (Temporal)   Ht 5' 3.5" (1.613 m)   Wt 119 lb 2 oz (54 kg)   LMP 07/25/1998   SpO2 96%   BMI 20.77 kg/m    CC: CPE Subjective:    Patient ID: Rachel Duran, female    DOB: [birth year], 63 y.o.   MRN: [id number]  HPI: Rachel Duran is a 63 y.o. female presenting on 07/09/2019 for Annual Exam (Prt 2. )   Saw health advisor last week for medicare wellness visit. Note reviewed.    No exam data present    Clinical Support from 07/02/2019 in [place] at [facility] Total Score  0      Fall Risk  07/02/2019 06/28/2018 06/21/2017 06/20/2016 06/15/2015  Falls in the past year? 1 1 No No No  Comment tripped and fell fell walking down stairs - - -  Number falls in past yr: 0 0 - - -  Injury with Fall? 1 1 - - -  Comment hurt her hip bruising to hip region - - -  Risk for fall due to : Medication side effect - - - -  Follow up Falls evaluation completed;Falls prevention discussed - - - -  Fall 09/2018 with residual R sciatica pain.  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 Watching choroidal nevus.   Recent R hip replacement [name]).  Chronic pain stemming from known cervical and lumbar DDD on Kadian ER 40mg  BID #60/mo and tramadol PRN breakthrough pain. Also on adderall 30mg  daily. For narcotic induced constipation, was on movantik low dose PRN (high dose caused decreased narcotic effect) but noticing she needs higher dose for effect. Also on scheduled linzess, senakot and miralax, MOM, benefiber.   Acute episode of constipation the last few days. She does skip meals. Still passing gas, no abd pain, no fevers. Good fluid intake. Last stool 3d ago. This is despite linzess, senakot, miralax, movantik and mag citrate.   Reynaud's  flaring up recently. Tested recently for scleroderma by rheum - negative eval. Unable to tolerate imdur. She is using nitroglycerin ointment (also having trouble tolerating this). Stays cold. Restarting stretching routine.   Preventative: COLONOSCOPY 02/2018 - done for positive cologuard - WNL, rpt 10 yrs [facility] Plan).  Well woman with Dr Josefa Half OBGYN 01/2019.  Mammogram Birads1 12/2018 DEXA - 2014 with mild osteopenia per GYN, 04/2019 stable osteopenia (T -2.0).  Flu shot - yearly pneumova 2011, prevnar 2015 Td 2007, Td 10/2016 Shingrix completed 10/2017.  Advanced care planning - HCPOA scanned into chart 06/2015. HCPOA is husband Marit Lorenson. Does not want prolonged life support if terminally ill.  Seat belt use discussed.  Sunscreen use discussed. No changing moles. Sees derm.  Non smoker  Alcohol - none  Dentist q6 mo  Eye exam q6 mo  Bowel - chronic constipation - on movantik - back up to 25mg  dose PRN (12.5mg  not effective).  Bladder - no incontinence, known IC followed by Dr Amalia Hailey doing well on elmiron and PRN pyridium. Hasn't needed recent instillations.   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 Interim medical history since our last visit reviewed. Allergies and medications reviewed and updated. Outpatient Medications Prior to Visit  Medication Sig Dispense Refill  . ALPRAZolam (XANAX) 1 MG tablet Take 0.5 mg by mouth at bedtime.   0  . AMBULATORY NON FORMULARY MEDICATION Medication Name: Gi cocktail 5-10 ml every 4-6 hours as needed for pain. 450 mL 1  . amLODipine (NORVASC) 5 MG tablet Take 1 tablet (5 mg total) by mouth daily. Take 1 and a half tablet (7.5 mg) by mouth daily (Patient taking differently: Take 5 mg by mouth daily. )  45 tablet 2  . amphetamine-dextroamphetamine (ADDERALL XR) 30 MG 24 hr capsule TAKE 1 CAPSULE BY MOUTH EVERY MORNING 90 capsule 0  . ARIPiprazole (ABILIFY) 5 MG tablet Take 5 mg by mouth at bedtime.     . calcium-vitamin D (OSCAL WITH D) 500-200 MG-UNIT tablet Take 1 tablet by mouth 2 (two) times daily.    . cyanocobalamin (,VITAMIN B-12,) 1000 MCG/ML injection INJECT 1 ML IM EVERY 21 DAYS 1 mL 6  . cyclobenzaprine (FLEXERIL) 5 MG tablet TAKE ONE TABLET BY MOUTH 3 TIMES DAILY AS NEEDED FOR MUSCLE SPASMS 30 tablet 1  . diclofenac sodium (VOLTAREN) 1 % GEL Apply 2-4 grams to affected joint up to 4 times daily. 400 g 4  . Estradiol (YUVAFEM) 10 MCG TABS vaginal tablet Place 1 tablet (10 mcg total) vaginally 3 (three) times a week. Please keep on file. (Patient taking differently: Place 1 tablet vaginally See admin instructions. Place 1 tablet vaginally Tuesday, Thursday and Saturday.) 12 tablet 10  . Eszopiclone 3 MG TABS TAKE ONE TABLET AT BEDTIME 90 tablet 0  . ferrous sulfate 325 (65 FE) MG tablet Take 325 mg by mouth every Monday, Wednesday, and Friday.     . fluticasone (FLONASE) 50 MCG/ACT nasal spray Place 2 sprays into both nostrils daily as needed (seasonal allergies). (Patient taking differently: Place 1 spray into both nostrils daily as needed for allergies. ) 16 g 5  . KADIAN 40 MG CP24 TAKE 1 CAPSULE TWICE DAILY 60 capsule 0  . lamoTRIgine (LAMICTAL) 100 MG tablet Take 100 mg by mouth daily.    Marland Kitchen LINZESS 290 MCG CAPS capsule TAKE 1 CAPSULE BY MOUTH EVERY DAY 90 capsule 1  . methocarbamol (ROBAXIN) 500 MG tablet Take 1 tablet (500 mg total) by mouth every 6 (six) hours as needed for muscle spasms. 40 tablet 1  . MOVANTIK 25 MG TABS tablet Take 25 mg by mouth daily.    . NONFORMULARY OR COMPOUNDED ITEM Testosterone propionate 2% in white petrolatum, apply small amount once daily for 5 days per week. (Patient taking differently: Apply 1 application topically See admin instructions.  Testosterone propionate 2% in white petrolatum, Apply small amount topically at bedtime on Tuesday, Thursday and Saturday.) 60 each 2  . ondansetron (ZOFRAN ODT) 4 MG disintegrating tablet Take 1 tablet (4 mg total) by mouth every 8 (eight) hours as needed for nausea or vomiting. 20 tablet 1  . ondansetron (ZOFRAN) 4 MG tablet TAKE ONE TABLET EVERY 8 HOURS AS NEEDED FOR NAUSEA OR VOMITING 30 tablet 1  . pantoprazole (PROTONIX) 40 MG tablet Take 1 tablet (40 mg total) by mouth 2 (two) times daily before a meal. 60 tablet 5  . pentosan polysulfate (ELMIRON) 100 MG capsule Take 200 mg by mouth 2 (two) times daily.     . polyethylene glycol (MIRALAX) 17 g packet Take 17  g by mouth 3 (three) times daily.     . pregabalin (LYRICA) 150 MG capsule TAKE 1 CAPSULE BY MOUTH TWICE DAILY 180 capsule 0  . senna (SENOKOT) 8.6 MG tablet Take 2 tablets by mouth daily as needed.     . SF 5000 PLUS 1.1 % CREA dental cream Place 1 application onto teeth at bedtime.     . traMADol (ULTRAM) 50 MG tablet TAKE ONE TABLET BY MOUTH TWICE DAILY AS NEEDED (Patient taking differently: Take 25 mg by mouth 2 (two) times daily as needed for moderate pain. ) 40 tablet 0  . ursodiol (ACTIGALL) 300 MG capsule Take 1 capsule (300 mg total) by mouth 2 (two) times daily. 60 capsule 5  . aspirin 81 MG chewable tablet Chew 1 tablet (81 mg total) by mouth 2 (two) times daily. 35 tablet 0  . naloxegol oxalate (MOVANTIK) 12.5 MG TABS tablet Take 1 tablet (12.5 mg total) by mouth daily. (Patient taking differently: Take 12.5 mg by mouth daily. As needed if Linzess is not enough) 30 tablet 3   No facility-administered medications prior to visit.     Per HPI unless specifically indicated in ROS section below Review of Systems  Constitutional: Positive for appetite change. Negative for activity change, chills, fatigue, fever and unexpected weight change.  HENT: Negative for hearing loss.   Eyes: Negative for visual disturbance.    Respiratory: Negative for cough, chest tightness, shortness of breath and wheezing.   Cardiovascular: Negative for chest pain, palpitations and leg swelling.  Gastrointestinal: Positive for abdominal pain, constipation and nausea. Negative for abdominal distention, blood in stool, diarrhea and vomiting.  Genitourinary: Negative for difficulty urinating and hematuria.  Musculoskeletal: Negative for arthralgias, myalgias and neck pain.  Skin: Negative for rash.  Neurological: Positive for headaches (migraines with nausea). Negative for dizziness, seizures and syncope.  Hematological: Negative for adenopathy. Bruises/bleeds easily.  Psychiatric/Behavioral: Negative for dysphoric mood. The patient is not nervous/anxious.    Objective:    BP 120/80 (BP Location: Left Arm, Patient Position: Sitting, Cuff Size: Normal)   Pulse 92   Temp 97.8 F (36.6 C) (Temporal)   Ht 5' 3.5" (1.613 m)   Wt 119 lb 2 oz (54 kg)   LMP 07/25/1998   SpO2 96%   BMI 20.77 kg/m   Wt Readings from Last 3 Encounters:  07/09/19 119 lb 2 oz (54 kg)  07/02/19 124 lb (56.2 kg)  06/04/19 124 lb 6.4 oz (56.4 kg)    Physical Exam Vitals and nursing note reviewed.  Constitutional:      General: She is not in acute distress.    Appearance: Normal appearance. She is well-developed. She is not ill-appearing.  HENT:     Head: Normocephalic and atraumatic.     Right Ear: Hearing, tympanic membrane, ear canal and external ear normal.     Left Ear: Hearing, tympanic membrane, ear canal and external ear normal.     Nose: Nose normal.     Mouth/Throat:     Pharynx: Uvula midline. No oropharyngeal exudate or posterior oropharyngeal erythema.  Eyes:     General: No scleral icterus.    Conjunctiva/sclera: Conjunctivae normal.     Pupils: Pupils are equal, round, and reactive to light.  Cardiovascular:     Rate and Rhythm: Normal rate and regular rhythm.     Pulses:          Radial pulses are 2+ on the right side and 2+  on the left side.  Heart sounds: Normal heart sounds. No murmur.  Pulmonary:     Effort: Pulmonary effort is normal. No respiratory distress.     Breath sounds: Normal breath sounds. No wheezing or rales.  Abdominal:     General: Bowel sounds are normal. There is no distension.     Palpations: Abdomen is soft. There is no mass.     Tenderness: There is no abdominal tenderness. There is no guarding or rebound.  Musculoskeletal:        General: Normal range of motion.     Cervical back: Normal range of motion and neck supple.  Lymphadenopathy:     Cervical: No cervical adenopathy.  Skin:    General: Skin is warm and dry.     Findings: No rash.  Neurological:     Mental Status: She is alert and oriented to person, place, and time.     Comments: CN grossly intact, station and gait intact  Psychiatric:        Behavior: Behavior normal.        Thought Content: Thought content normal.        Judgment: Judgment normal.       Results for orders placed or performed in visit on 07/02/19  Hemoglobin A1c  Result Value Ref Range   Hgb A1c MFr Bld 6.1 4.6 - 6.5 %  Ferritin  Result Value Ref Range   Ferritin 62.1 10.0 - 291.0 ng/mL  IBC panel  Result Value Ref Range   Iron 56 42 - 145 ug/dL   Transferrin 285.0 212.0 - 360.0 mg/dL   Saturation Ratios 14.0 (L) 20.0 - 50.0 %  TSH  Result Value Ref Range   TSH 3.41 0.35 - 4.50 uIU/mL  Comprehensive metabolic panel  Result Value Ref Range   Sodium 134 (L) 135 - 145 mEq/L   Potassium 4.4 3.5 - 5.1 mEq/L   Chloride 97 96 - 112 mEq/L   CO2 31 19 - 32 mEq/L   Glucose, Bld 96 70 - 99 mg/dL   BUN 8 6 - 23 mg/dL   Creatinine, Ser 0.55 0.40 - 1.20 mg/dL   Total Bilirubin 0.3 0.2 - 1.2 mg/dL   Alkaline Phosphatase 109 39 - 117 U/L   AST 15 0 - 37 U/L   ALT 8 0 - 35 U/L   Total Protein 6.6 6.0 - 8.3 g/dL   Albumin 4.5 3.5 - 5.2 g/dL   GFR 112.20 >60.00 mL/min   Calcium 9.7 8.4 - 10.5 mg/dL  Lipid panel  Result Value Ref Range    Cholesterol 219 (H) 0 - 200 mg/dL   Triglycerides 157.0 (H) 0.0 - 149.0 mg/dL   HDL 67.90 >39.00 mg/dL   VLDL 31.4 0.0 - 40.0 mg/dL   LDL Cholesterol 120 (H) 0 - 99 mg/dL   Total CHOL/HDL Ratio 3    NonHDL 151.24    Assessment & Plan:  This visit occurred during the SARS-CoV-2 public health emergency.  Safety protocols were in place, including screening questions prior to the visit, additional usage of staff PPE, and extensive cleaning of exam room while observing appropriate contact time as indicated for disinfecting solutions.   Problem List Items Addressed This Visit    Stargardt's disease    Appreciate ophtho care.      [name] syndrome    Struggled this past year. Did not tolerate imdur. Continues low dose amlodipine and nitro ointment. She has stopped aspirin 81mg  daily - will restart if notices worsening raynaud's.  Prediabetes    Encouraged avoiding added sugars in diet.       MDD (major depressive disorder), recurrent episode, moderate (HCC)    Followed by psychology/psychiatry      Iron deficiency    Continue MWF oral iron.       Health maintenance examination - Primary    Preventative protocols reviewed and updated unless pt declined. Discussed healthy diet and lifestyle.       GERD    Continues protonix 40mg  bid.       Relevant Medications   MOVANTIK 25 MG TABS tablet   Encounter for chronic pain management    [place] CSRS reviewed.       Dyslipidemia    Chronic, deteriorated. Off meds. Possible worsening from large ice cream bowl daily - she will work on this. ASCVD risk remains low.  The 10-year ASCVD risk score Mikey Bussing DC Brooke Bonito., et al., 2013) is: 3%   Values used to calculate the score:     Age: 20 years     Sex: Female     Is Non-Hispanic African American: No     Diabetic: No     Tobacco smoker: No     Systolic Blood Pressure: 123456 mmHg     Is BP treated: No     HDL Cholesterol: 67.9 mg/dL     Total Cholesterol: 219 mg/dL       Drug-induced  constipation    Narcotic induced, acute worsening recently. May correlate to skipped meals and recent decreased PO intake. No red flags. Encouraged increased PO intake, continue movantik and bowel regimen. If movantik losing effectiveness, may need to refer to GI to discuss other treatment options.       Chronic pain syndrome   Chronic interstitial cystitis    Stable period on elmiron and PRN pyridium followed by urology Dr Amalia Hailey.       Chronic insomnia    Tried and failed multiple other treatment options.       CFS (chronic fatigue syndrome)    Continues adderall XR 30mg  daily.       Cervical disc disorder with radiculopathy of cervical region   Biliary stasis    Appreciate GI care - recent reassuring MRCP      Abnormal thyroid function test    TSH remains normal.           No orders of the defined types were placed in this encounter.  No orders of the defined types were placed in this encounter.  Patient instructions: Good to see you today.  We will work on [employer] for [employer].  Let us know if persistent constipation trouble despite medicines Return as needed or in 3 months for follow up visit.   Follow up plan: Return in about 3 months (around 10/07/2019) for follow up visit.  Ria Bush, MD

## 2019-07-09 NOTE — Assessment & Plan Note (Signed)
Narcotic induced, acute worsening recently. May correlate to skipped meals and recent decreased PO intake. No red flags. Encouraged increased PO intake, continue movantik and bowel regimen. If movantik losing effectiveness, may need to refer to GI to discuss other treatment options.

## 2019-07-09 NOTE — Assessment & Plan Note (Signed)
[place] CSRS reviewed  ?

## 2019-07-09 NOTE — Assessment & Plan Note (Signed)
Continues adderall XR 30mg  daily.

## 2019-07-09 NOTE — Assessment & Plan Note (Signed)
Appreciate GI care - recent reassuring MRCP

## 2019-07-09 NOTE — Assessment & Plan Note (Signed)
Stable period on elmiron and PRN pyridium followed by urology Dr Amalia Hailey.

## 2019-07-09 NOTE — Assessment & Plan Note (Signed)
Continues protonix 40mg bid 

## 2019-07-09 NOTE — Telephone Encounter (Signed)
Pt received insurance notice that movantik and lunesta approval ends 07/25/2019.  Can we work on filling out PA sometime this or early next week for 2021? Thank you.

## 2019-07-09 NOTE — Assessment & Plan Note (Signed)
Appreciate ophtho care.  

## 2019-07-09 NOTE — Assessment & Plan Note (Signed)
TSH remains normal.  

## 2019-07-09 NOTE — Assessment & Plan Note (Signed)
Continue MWF oral iron.

## 2019-07-09 NOTE — Patient Instructions (Addendum)
Good to see you today.  We will work on [employer] for [employer].  Let us know if persistent constipation trouble despite medicines Return as needed or in 3 months for follow up visit.   Health Maintenance for Postmenopausal Women Menopause is a normal process in which your ability to get pregnant comes to an end. This process happens slowly over many months or years, usually between the ages of 80 and 73. Menopause is complete when you have missed your menstrual periods for 12 months. It is important to talk with your health care provider about some of the most common conditions that affect women after menopause (postmenopausal women). These include heart disease, cancer, and bone loss (osteoporosis). Adopting a healthy lifestyle and getting preventive care can help to promote your health and wellness. The actions you take can also lower your chances of developing some of these common conditions. What should I know about menopause? During menopause, you may get a number of symptoms, such as:  Hot flashes. These can be moderate or severe.  Night sweats.  Decrease in sex drive.  Mood swings.  Headaches.  Tiredness.  Irritability.  Memory problems.  Insomnia. Choosing to treat or not to treat these symptoms is a decision that you make with your health care provider. Do I need hormone replacement therapy?  Hormone replacement therapy is effective in treating symptoms that are caused by menopause, such as hot flashes and night sweats.  Hormone replacement carries certain risks, especially as you become older. If you are thinking about using estrogen or estrogen with progestin, discuss the benefits and risks with your health care provider. What is my risk for heart disease and stroke? The risk of heart disease, heart attack, and stroke increases as you age. One of the causes may be a change in the body's hormones during menopause. This can affect how your body uses dietary fats,  triglycerides, and cholesterol. Heart attack and stroke are medical emergencies. There are many things that you can do to help prevent heart disease and stroke. Watch your blood pressure  High blood pressure causes heart disease and increases the risk of stroke. This is more likely to develop in people who have high blood pressure readings, are of African descent, or are overweight.  Have your blood pressure checked: ? Every 3-5 years if you are 48-33 years of age. ? Every year if you are 47 years old or older. Eat a healthy diet   Eat a diet that includes plenty of vegetables, fruits, low-fat dairy products, and lean protein.  Do not eat a lot of foods that are high in solid fats, added sugars, or sodium. Get regular exercise Get regular exercise. This is one of the most important things you can do for your health. Most adults should:  Try to exercise for at least 150 minutes each week. The exercise should increase your heart rate and make you sweat (moderate-intensity exercise).  Try to do strengthening exercises at least twice each week. Do these in addition to the moderate-intensity exercise.  Spend less time sitting. Even light physical activity can be beneficial. Other tips  Work with your health care provider to achieve or maintain a healthy weight.  Do not use any products that contain nicotine or tobacco, such as cigarettes, e-cigarettes, and chewing tobacco. If you need help quitting, ask your health care provider.  Know your numbers. Ask your health care provider to check your cholesterol and your blood sugar (glucose). Continue to have  your blood tested as directed by your health care provider. Do I need screening for cancer? Depending on your health history and family history, you may need to have cancer screening at different stages of your life. This may include screening for:  Breast cancer.  Cervical cancer.  Lung cancer.  Colorectal cancer. What is my risk for  osteoporosis? After menopause, you may be at increased risk for osteoporosis. Osteoporosis is a condition in which bone destruction happens more quickly than new bone creation. To help prevent osteoporosis or the bone fractures that can happen because of osteoporosis, you may take the following actions:  If you are 41-59 years old, get at least 1,000 mg of calcium and at least 600 mg of vitamin D per day.  If you are older than age 75 but younger than age 63, get at least 1,200 mg of calcium and at least 600 mg of vitamin D per day.  If you are older than age 61, get at least 1,200 mg of calcium and at least 800 mg of vitamin D per day. Smoking and drinking excessive alcohol increase the risk of osteoporosis. Eat foods that are rich in calcium and vitamin D, and do weight-bearing exercises several times each week as directed by your health care provider. How does menopause affect my mental health? Depression may occur at any age, but it is more common as you become older. Common symptoms of depression include:  Low or sad mood.  Changes in sleep patterns.  Changes in appetite or eating patterns.  Feeling an overall lack of motivation or enjoyment of activities that you previously enjoyed.  Frequent crying spells. Talk with your health care provider if you think that you are experiencing depression. General instructions See your health care provider for regular wellness exams and vaccines. This may include:  Scheduling regular health, dental, and eye exams.  Getting and maintaining your vaccines. These include: ? Influenza vaccine. Get this vaccine each year before the flu season begins. ? Pneumonia vaccine. ? Shingles vaccine. ? Tetanus, diphtheria, and pertussis (Tdap) booster vaccine. Your health care provider may also recommend other immunizations. Tell your health care provider if you have ever been abused or do not feel safe at home. Summary  Menopause is a normal process in  which your ability to get pregnant comes to an end.  This condition causes hot flashes, night sweats, decreased interest in sex, mood swings, headaches, or lack of sleep.  Treatment for this condition may include hormone replacement therapy.  Take actions to keep yourself healthy, including exercising regularly, eating a healthy diet, watching your weight, and checking your blood pressure and blood sugar levels.  Get screened for cancer and depression. Make sure that you are up to date with all your vaccines. This information is not intended to replace advice given to you by your health care provider. Make sure you discuss any questions you have with your health care provider. Document Released: 09/02/2005 Document Revised: 07/04/2018 Document Reviewed: 07/04/2018 Elsevier Patient Education  2020 [employer].

## 2019-07-09 NOTE — Assessment & Plan Note (Signed)
Struggled this past year. Did not tolerate imdur. Continues low dose amlodipine and nitro ointment. She has stopped aspirin 81mg  daily - will restart if notices worsening raynaud's.

## 2019-07-09 NOTE — Assessment & Plan Note (Signed)
Encouraged avoiding added sugars in diet.  

## 2019-07-09 NOTE — Assessment & Plan Note (Signed)
Followed by psychology/psychiatry

## 2019-07-14 ENCOUNTER — Encounter: Payer: Self-pay | Admitting: Family Medicine

## 2019-07-18 MED ORDER — MOTEGRITY 1 MG PO TABS: 1.0000 | ORAL_TABLET | Freq: Every day | ORAL | 1 refills | Status: DC

## 2019-07-22 ENCOUNTER — Other Ambulatory Visit: Payer: Self-pay | Admitting: Family Medicine

## 2019-07-22 NOTE — Telephone Encounter (Signed)
Name of Medication: Kadian Name of Pharmacy: [facility] Date and Quantity: 06/19/19, #60 Last Office Visit and Type: 07/09/19, CPE prt 2 & pain mngmt f/u Next Office Visit and Type: 10/08/19, 3 mo pain mngmt f/u Last Controlled Substance Agreement Date: 04/19/19 Last UDS:  04/19/19

## 2019-07-22 NOTE — Telephone Encounter (Signed)
Submitted PA for Movantik 25 mg tab, key:  [id number].   Decision pending.  Submitted PA for Eszopiclone (Lunesta) 3 mg tab, key:  [id number].  Decision pending.

## 2019-07-22 NOTE — Telephone Encounter (Signed)
Opened in error

## 2019-07-23 ENCOUNTER — Encounter: Payer: Self-pay | Admitting: Family Medicine

## 2019-07-23 NOTE — Telephone Encounter (Signed)
Patient called back Checking status of script , she stated she is needing this sent today so the pharmacy can order the prescription

## 2019-07-23 NOTE — Telephone Encounter (Signed)
ERx 

## 2019-07-23 NOTE — Telephone Encounter (Signed)
Received faxed PA approval for Movantik (thru 01/20/2020) and Eszopiclone (thru 07/24/2020).

## 2019-08-01 ENCOUNTER — Encounter: Payer: Self-pay | Admitting: Family Medicine

## 2019-08-20 ENCOUNTER — Other Ambulatory Visit: Payer: Self-pay | Admitting: Family Medicine

## 2019-08-20 ENCOUNTER — Other Ambulatory Visit: Payer: Self-pay | Admitting: Gastroenterology

## 2019-08-20 NOTE — Telephone Encounter (Signed)
Name of Medication: Kadian Name of Pharmacy: [place] or Written Date and Quantity: 07/23/19, #60 Last Office Visit and Type: 07/09/19, AWV prt 2 Next Office Visit and Type: 10/08/19, 3 mo chronic pain f/u Last Controlled Substance Agreement Date: 04/19/19 Last UDS: 04/19/19

## 2019-08-22 ENCOUNTER — Other Ambulatory Visit: Payer: Self-pay

## 2019-08-22 NOTE — Telephone Encounter (Signed)
Error

## 2019-08-23 NOTE — Telephone Encounter (Signed)
ERx plz notify patient this was sent in.

## 2019-08-23 NOTE — Telephone Encounter (Signed)
Pt calling asking why the rx was not filled yet. She will be out of medication on Tuesday and the pharmacy has to order the medication. It takes 24-48 hours for them to get the rx from the distributor.   Asking for someone to call or send MyChart once it is done.

## 2019-08-26 ENCOUNTER — Telehealth: Payer: Self-pay | Admitting: Family Medicine

## 2019-08-26 MED ORDER — MORPHABOND ER 15 MG PO T12A: EXTENDED_RELEASE_TABLET | ORAL | 0 refills | Status: DC

## 2019-08-26 NOTE — Telephone Encounter (Signed)
Pt called [facility]. They could not come up with an extended release brand name morphine that was still on the market. Seems that this medication has reached the end of this medication class. She thinks she tried Duragesic Patch 25 years ago but can't remember what the issue was with it. Would be willing to try that. She is good with generic to try. She has medication through tomorrow. Concerned she may go in to withdrawals if she does not have anything before Wednesday.

## 2019-08-26 NOTE — Telephone Encounter (Signed)
I'm sorry to hear Kadian was discontinued. I have sent in morphabond ER 15mg  tablets - want her to take 2 in the morning, 1 in the afternoon, and 2 in the evening for total morphine dose of 75mg  daily (she was previously on 80mg ).  plz start PA for this.

## 2019-08-26 NOTE — Telephone Encounter (Signed)
Contacted [facility] and they reported the morphabond does not need a PA because it is not covered by insurance. The cost to pt will be over $800. Asked for an alternative with pt's insurance and the pharmacist said they would not know what an alternative would be that her insurance will cover. They reported pt will need to call her insurance to see what is covered.   Contacted pt and advised. She will contact her insurance again to see if there is an alternative. Pt is anxious because she will be out of medication 2/3 morning. She is concerned she may have withdrawals since taking this for years. She reports she can only take name brand morphine. She has set up a VV for tomorrow with PCP to discuss this. Advised a msg would be sent to PCP and this office would f/u. Pt appreciative and verbalized understanding.

## 2019-08-26 NOTE — Telephone Encounter (Signed)
Pt called she is having problems getting her kadian refilled. pharmacy stated this has been discontinued.   Pharmacy gave her recommendation of morphabond.  Pt will be out Wednesday.  Pharmacy needs rx today so they can order in order for pt to have Wednesday  Please let pt know when rx has been called

## 2019-08-26 NOTE — Telephone Encounter (Addendum)
We may try duragesic patch.  Currently on morphine 80 MMED.  Fentanyl conversion would be 33.3mg  /day. However may not be best option given low bmi. Will discuss tomorrow.

## 2019-08-26 NOTE — Telephone Encounter (Signed)
Patient called [employer].  They said they would need a prior authorization sent to them high priority.

## 2019-08-27 ENCOUNTER — Telehealth (INDEPENDENT_AMBULATORY_CARE_PROVIDER_SITE_OTHER): Payer: Medicare Other | Admitting: Family Medicine

## 2019-08-27 ENCOUNTER — Encounter: Payer: Self-pay | Admitting: Family Medicine

## 2019-08-27 ENCOUNTER — Other Ambulatory Visit: Payer: Self-pay

## 2019-08-27 VITALS — BP 118/78 | HR 81 | Temp 97.8°F | Ht 63.5 in | Wt 118.0 lb

## 2019-08-27 DIAGNOSIS — G894 Chronic pain syndrome: Secondary | ICD-10-CM | POA: Diagnosis not present

## 2019-08-27 DIAGNOSIS — G8929 Other chronic pain: Secondary | ICD-10-CM

## 2019-08-27 MED ORDER — FENTANYL 25 MCG/HR TD PT72: 1.0000 | MEDICATED_PATCH | TRANSDERMAL | 0 refills | Status: DC

## 2019-08-27 MED ORDER — TRAMADOL HCL 50 MG PO TABS: 50.0000 mg | ORAL_TABLET | Freq: Three times a day (TID) | ORAL | 0 refills | Status: DC | PRN

## 2019-08-27 NOTE — Assessment & Plan Note (Addendum)
Rachel Duran CSRS reviewed. Rachel Duran has been discontinued.  Prior MMED = 80, equivalent to fentanyl 96mcg/day Will start fentanyl 57mcg 1 patch Q72 hours (Rachel Duran discontinued). This is 25% decrease in pain dosing with transition to new opiate. Will provide tramadol 50mg  TID PRN for breakthrough pain. Update with effect. She has f/u appt already scheduled for next month.  This is 3rd long acting brand opiate that has been discontinued. Due to ongoing difficulty filling current chronic pain regimen, patient interested in trial of wean off opiate.  We also discussed possible referral to pain management clinic if not doing well with above plan - she is open to this.

## 2019-08-27 NOTE — Progress Notes (Addendum)
Virtual visit completed through [place]. Due to national recommendations of social distancing due to COVID-19, a virtual visit is felt to be most appropriate for this patient at this time. Reviewed limitations of a virtual visit.   Patient location: home Provider location: [place] [facility], office If any vitals were documented, they were collected by patient at home unless specified below.    BP 118/78    Pulse 81    Temp 97.8 F (36.6 C)    Ht 5' 3.5" (1.613 m)    Wt 118 lb (53.5 kg)    LMP 07/25/1998    BMI 20.57 kg/m    CC: discuss pain regimen Subjective:    Patient ID: Rachel Duran, female    DOB: September 26, 1955, 64 y.o.   MRN: [id number]  HPI: Rachel Duran is a 64 y.o. female presenting on 08/27/2019 for Discuss Medication (Wants to discuss Kadian.  Has been d/c. )   See recent phone note.  H/o chronic pain due to fibromyalgia, ostearthritis, interstitial cystitis, and DDD  Did not tolerate nucynta (05/2015). Embeda effective for years, then discontinued late 2019. MS Contin not effective for pain control, kadian started 08/2018 - discontinued 08/2019.   Recently found out kadian has been discontinued.  Codeine, hydrocodone, oxycodone cause nausea/vomiting.  Doesn't remember effect on fentanyl patches - remotely tried.   Episode Sunday of abd pain with nausea/vomiting without fever or diarrhea. Emesis NBNB. Slowly recovering from this.   From prior visit: Indication for chronic opioid: chronic pain syndrome, fibromyalgia, osteoarthritis, lumbar and cervical disc disease Medication and dose: kadian 40mg  bid, tramadol PRN (Kadian discontinued) # pills per month: 60, 40 (last several months) Last UDS date: 03/2019  Pain contract signed (Y/N): Y Date narcotic database last reviewed (include red flags): 06/2019     Relevant past medical, surgical, family and social history reviewed and updated as indicated. Interim medical history since our last visit  reviewed. Allergies and medications reviewed and updated. Outpatient Medications Prior to Visit  Medication Sig Dispense Refill   ALPRAZolam (XANAX) 1 MG tablet Take 0.5 mg by mouth at bedtime.   0   AMBULATORY NON FORMULARY MEDICATION Medication Name: Gi cocktail 5-10 ml every 4-6 hours as needed for pain. 450 mL 1   amLODipine (NORVASC) 5 MG tablet Take 1 tablet (5 mg total) by mouth daily. Take 1 and a half tablet (7.5 mg) by mouth daily (Patient taking differently: Take 5 mg by mouth daily. ) 45 tablet 2   amphetamine-dextroamphetamine (ADDERALL XR) 30 MG 24 hr capsule TAKE 1 CAPSULE BY MOUTH EVERY MORNING 90 capsule 0   ARIPiprazole (ABILIFY) 5 MG tablet Take 5 mg by mouth at bedtime.      calcium-vitamin D (OSCAL WITH D) 500-200 MG-UNIT tablet Take 1 tablet by mouth 2 (two) times daily.     cyanocobalamin (,VITAMIN B-12,) 1000 MCG/ML injection INJECT 1 ML IM EVERY 21 DAYS 1 mL 6   cyclobenzaprine (FLEXERIL) 5 MG tablet TAKE ONE TABLET BY MOUTH 3 TIMES DAILY AS NEEDED FOR MUSCLE SPASMS 30 tablet 1   diclofenac sodium (VOLTAREN) 1 % GEL Apply 2-4 grams to affected joint up to 4 times daily. 400 g 4   Estradiol (YUVAFEM) 10 MCG TABS vaginal tablet Place 1 tablet (10 mcg total) vaginally 3 (three) times a week. Please keep on file. (Patient taking differently: Place 1 tablet vaginally See admin instructions. Place 1 tablet vaginally Tuesday, Thursday and Saturday.) 12 tablet 10   Eszopiclone 3  MG TABS TAKE ONE TABLET AT BEDTIME 90 tablet 0   ferrous sulfate 325 (65 FE) MG tablet Take 325 mg by mouth every Monday, Wednesday, and Friday.      fluticasone (FLONASE) 50 MCG/ACT nasal spray Place 2 sprays into both nostrils daily as needed (seasonal allergies). (Patient taking differently: Place 1 spray into both nostrils daily as needed for allergies. ) 16 g 5   lamoTRIgine (LAMICTAL) 100 MG tablet Take 100 mg by mouth daily.     LINZESS 290 MCG CAPS capsule TAKE 1 CAPSULE BY MOUTH  EVERY DAY 90 capsule 1   methocarbamol (ROBAXIN) 500 MG tablet Take 1 tablet (500 mg total) by mouth every 6 (six) hours as needed for muscle spasms. 40 tablet 1   MOVANTIK 25 MG TABS tablet Take 25 mg by mouth daily.     NONFORMULARY OR COMPOUNDED ITEM Testosterone propionate 2% in white petrolatum, apply small amount once daily for 5 days per week. (Patient taking differently: Apply 1 application topically See admin instructions. Testosterone propionate 2% in white petrolatum, Apply small amount topically at bedtime on Tuesday, Thursday and Saturday.) 60 each 2   ondansetron (ZOFRAN ODT) 4 MG disintegrating tablet Take 1 tablet (4 mg total) by mouth every 8 (eight) hours as needed for nausea or vomiting. 20 tablet 1   ondansetron (ZOFRAN) 4 MG tablet TAKE ONE TABLET EVERY 8 HOURS AS NEEDED FOR NAUSEA OR VOMITING 30 tablet 1   pantoprazole (PROTONIX) 40 MG tablet Take 1 tablet (40 mg total) by mouth 2 (two) times daily before a meal. 60 tablet 5   pentosan polysulfate (ELMIRON) 100 MG capsule Take 200 mg by mouth 2 (two) times daily.      polyethylene glycol (MIRALAX) 17 g packet Take 17 g by mouth 3 (three) times daily.      pregabalin (LYRICA) 150 MG capsule TAKE 1 CAPSULE BY MOUTH TWICE DAILY 180 capsule 0   senna (SENOKOT) 8.6 MG tablet Take 2 tablets by mouth daily as needed.      SF 5000 PLUS 1.1 % CREA dental cream Place 1 application onto teeth at bedtime.      ursodiol (ACTIGALL) 300 MG capsule TAKE 1 CAPSULE TWICE DAILY 60 capsule 5   Morphine Sulfate ER (MORPHABOND ER) 15 MG T12A Take 30 mg by mouth daily with breakfast AND 15 mg daily in the afternoon AND 30 mg every evening. 150 tablet 0   traMADol (ULTRAM) 50 MG tablet TAKE ONE TABLET BY MOUTH TWICE DAILY AS NEEDED (Patient taking differently: Take 25 mg by mouth 2 (two) times daily as needed for moderate pain. ) 40 tablet 0   No facility-administered medications prior to visit.     Per HPI unless specifically  indicated in ROS section below Review of Systems Objective:    BP 118/78    Pulse 81    Temp 97.8 F (36.6 C)    Ht 5' 3.5" (1.613 m)    Wt 118 lb (53.5 kg)    LMP 07/25/1998    BMI 20.57 kg/m   Wt Readings from Last 3 Encounters:  08/27/19 118 lb (53.5 kg)  07/09/19 119 lb 2 oz (54 kg)  07/02/19 124 lb (56.2 kg)     Physical exam: Gen: alert, NAD, not ill appearing Pulm: speaks in complete sentences without increased work of breathing Psych: normal mood, normal thought content      Results for orders placed or performed in visit on 07/02/19  Hemoglobin A1c  Result  Value Ref Range   Hgb A1c MFr Bld 6.1 4.6 - 6.5 %  Ferritin  Result Value Ref Range   Ferritin 62.1 10.0 - 291.0 ng/mL  IBC panel  Result Value Ref Range   Iron 56 42 - 145 ug/dL   Transferrin 285.0 212.0 - 360.0 mg/dL   Saturation Ratios 14.0 (L) 20.0 - 50.0 %  TSH  Result Value Ref Range   TSH 3.41 0.35 - 4.50 uIU/mL  Comprehensive metabolic panel  Result Value Ref Range   Sodium 134 (L) 135 - 145 mEq/L   Potassium 4.4 3.5 - 5.1 mEq/L   Chloride 97 96 - 112 mEq/L   CO2 31 19 - 32 mEq/L   Glucose, Bld 96 70 - 99 mg/dL   BUN 8 6 - 23 mg/dL   Creatinine, Ser 0.55 0.40 - 1.20 mg/dL   Total Bilirubin 0.3 0.2 - 1.2 mg/dL   Alkaline Phosphatase 109 39 - 117 U/L   AST 15 0 - 37 U/L   ALT 8 0 - 35 U/L   Total Protein 6.6 6.0 - 8.3 g/dL   Albumin 4.5 3.5 - 5.2 g/dL   GFR 112.20 >60.00 mL/min   Calcium 9.7 8.4 - 10.5 mg/dL  Lipid panel  Result Value Ref Range   Cholesterol 219 (H) 0 - 200 mg/dL   Triglycerides 157.0 (H) 0.0 - 149.0 mg/dL   HDL 67.90 >39.00 mg/dL   VLDL 31.4 0.0 - 40.0 mg/dL   LDL Cholesterol 120 (H) 0 - 99 mg/dL   Total CHOL/HDL Ratio 3    NonHDL 151.24    Assessment & Plan:   Problem List Items Addressed This Visit    Encounter for chronic pain management - Primary    [place] CSRS reviewed. Kadian has been discontinued.  Prior MMED = 80, equivalent to fentanyl 63mcg/day Will start  fentanyl 7mcg 1 patch Q72 hours (Kadian discontinued). This is 25% decrease in pain dosing with transition to new opiate. Will provide tramadol 50mg  TID PRN for breakthrough pain. Update with effect. She has f/u appt already scheduled for next month.  This is 3rd long acting brand opiate that has been discontinued. Due to ongoing difficulty filling current chronic pain regimen, patient interested in trial of wean off opiate.  We also discussed possible referral to pain management clinic if not doing well with above plan - she is open to this.       Chronic pain syndrome   Relevant Medications   fentaNYL (DURAGESIC) 25 MCG/HR   traMADol (ULTRAM) 50 MG tablet       Meds ordered this encounter  Medications   fentaNYL (DURAGESIC) 25 MCG/HR    Sig: Place 1 patch onto the skin every 3 (three) days.    Dispense:  10 patch    Refill:  0    To replace Kadian   traMADol (ULTRAM) 50 MG tablet    Sig: Take 1 tablet (50 mg total) by mouth 3 (three) times daily as needed for moderate pain.    Dispense:  70 tablet    Refill:  0    Note new sig   No orders of the defined types were placed in this encounter.   I discussed the assessment and treatment plan with the patient. The patient was provided an opportunity to ask questions and all were answered. The patient agreed with the plan and demonstrated an understanding of the instructions. The patient was advised to call back or seek an in-person evaluation if the  symptoms worsen or if the condition fails to improve as anticipated.  Follow up plan: No follow-ups on file.  Ria Bush, MD

## 2019-09-02 ENCOUNTER — Encounter: Payer: Self-pay | Admitting: Family Medicine

## 2019-09-02 NOTE — Progress Notes (Signed)
Office Visit Note  Patient: Rachel Duran             Date of Birth: 11-28-1955           MRN: [id number]             PCP: Ria Bush, MD Referring: Ria Bush, MD Visit Date: 09/05/2019 Occupation: _0 @  Subjective:  Fibromyalgia (Bil hand pain, bil knee pain)   History of Present Illness: Rachel Duran is a 64 y.o. female with history of osteoarthritis and fibromyalgia syndrome.  She states recently she was switched to fentanyl patch which she is on lower dose.  She has been experiencing increased pain and discomfort in her hands and knee joints for the last 3 months.  She states that after her last cortisone injections on May 08, 2019 the effect lasted only for couple of months.  Now the pain has recurred.  Activities of Daily Living:  Patient reports morning stiffness for 2 hours.   Patient Reports nocturnal pain.  Difficulty dressing/grooming: Reports Difficulty climbing stairs: Reports Difficulty getting out of chair: Denies Difficulty using hands for taps, buttons, cutlery, and/or writing: Reports  Review of Systems  Constitutional: Positive for fatigue. Negative for night sweats, weight gain and weight loss.  HENT: Positive for mouth dryness. Negative for mouth sores, trouble swallowing, trouble swallowing and nose dryness.   Eyes: Positive for dryness. Negative for pain, redness and visual disturbance.  Respiratory: Negative for cough, shortness of breath and difficulty breathing.   Cardiovascular: Negative for chest pain, palpitations, hypertension, irregular heartbeat and swelling in legs/feet.  Gastrointestinal: Positive for constipation. Negative for blood in stool and diarrhea.  Endocrine: Negative for cold intolerance and increased urination.  Genitourinary: Negative for difficulty urinating and vaginal dryness.  Musculoskeletal: Positive for arthralgias, gait problem, joint pain, myalgias, morning stiffness and myalgias.  Negative for joint swelling, muscle weakness and muscle tenderness.  Skin: Negative for color change, rash, hair loss, skin tightness, ulcers and sensitivity to sunlight.  Allergic/Immunologic: Negative for susceptible to infections.  Neurological: Positive for headaches. Negative for dizziness, memory loss, night sweats and weakness.  Hematological: Negative for bruising/bleeding tendency and swollen glands.  Psychiatric/Behavioral: Positive for sleep disturbance. Negative for depressed mood. The patient is not nervous/anxious.     PMFS History:  Patient Active Problem List   Diagnosis Date Noted  . Positive ANA (antinuclear antibody) 06/05/2019  . 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  . Unilateral primary osteoarthritis, left hip 01/28/2019  . Pelvic pain 12/12/2018  . Common bile duct (CBD) obstruction   . Elevated alkaline phosphatase level   . Venous insufficiency of left lower extremity 07/04/2018  . Dyslipidemia 09/22/2017  . DDD (degenerative disc disease), cervical 04/03/2017  . DDD (degenerative disc disease), lumbar 04/03/2017  . Onychomycosis 03/21/2017  . Dysphagia 10/18/2016  . Encounter for chronic pain management 10/18/2016  . Biliary stasis 09/26/2016  . Fever blister 08/18/2016  . Urinary urgency 07/05/2016  . HNP (herniated nucleus pulposus), lumbar 09/23/2015  . Sinus congestion 07/30/2015  . Iron deficiency 07/30/2015  . Health maintenance examination 06/15/2015  . 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 Headache 03/09/2015  . Clavicle enlargement 12/13/2014  . Prediabetes 12/13/2014  . Plantar fasciitis, bilateral 09/11/2014  . Advanced care planning/counseling discussion 06/10/2014  . Medicare annual wellness visit, subsequent 06/10/2014  . Abnormal thyroid function test 06/10/2014  . Chronic pain syndrome 06/10/2014  . CFS (chronic fatigue syndrome) 04/08/2014  . Postmenopausal atrophic vaginitis 10/19/2012  .  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 Cervical disc disorder with radiculopathy of cervical region 05/28/2010  . CHEST PAIN UNSPECIFIED 02/28/2008  . APHTHOUS ULCERS 01/31/2008  . Chronic insomnia 11/09/2007  . Drug-induced constipation 10/11/2007  . Allergic rhinitis 04/12/2007  . MDD (major depressive disorder), recurrent episode, moderate ([place]) 03/05/2007  . [name] syndrome 03/05/2007  . GERD 03/05/2007  . ROSACEA 03/05/2007  . NEURALGIA 03/05/2007  . 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 Chronic interstitial cystitis 12/06/2006  . Fibromyalgia 12/06/2006    Past Medical History:  Diagnosis Date  . Abdominal pain last 4 months   and nausea also  . Allergy   . Anemia    history of  . Anxiety   . Bipolar disorder ([place])    atpical bipolar disorder  . 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 C6- C7 -hx. past fusion(bone graft used)  . Cholecystitis   . Chronic pain   . DDD (degenerative disc disease), lumbar 09/2015   dextroscoliosis with multilevel DDD and facet arthrosis most notable for R foraminal disc protrusion L4/5 producing severe R neural foraminal stenosis abutting R L4 nerve root, moderate spinal canal and mild lat recesss and R neural foraminal stenosis L3/4 (MRI)  . Depression    bipolar depression  . Disorders of porphyrin metabolism   . Felon of finger of left hand 11/10/2016  . Fibromyalgia   . GERD (gastroesophageal reflux disease)   . Headache    occasionally  . Internal hemorrhoids   . Interstitial cystitis 06-06-12   hx.  . Irritable bowel syndrome   . PONV (postoperative nausea and vomiting)    now uses stomach blockers and no ponv  . 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   Evaluated in Pulmonary clinic/ [facility]/ [place] /  02/07/12 > referred to Health Dept 02/10/2012     - POS GOLD    01/31/2012    . Raynauds disease    hx.  . Seronegative arthritis    Devin Foskey  . 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 CAD Father 57       MI, nonsmoker  . Esophageal cancer Father 69  . Stomach cancer Father   . Scleroderma Mother   . Hypertension Mother   . Esophageal cancer Paternal Grandfather   . Stomach cancer Paternal Grandfather   . Diabetes Maternal Grandmother   . Arthritis Brother   . Stroke Neg Hx   . Colon cancer Neg Hx   . Rectal cancer Neg Hx    Past Surgical History:  Procedure Laterality Date  . ANTERIOR CERVICAL DECOMP/DISCECTOMY FUSION  2004   C5/6, C6/7  . ANTERIOR CERVICAL DECOMP/DISCECTOMY FUSION  02/2016   C3/4, C4/5 with plating [name])  . AUGMENTATION MAMMAPLASTY Bilateral 03/25/2010  . BREAST ENHANCEMENT SURGERY  2010  . BREAST IMPLANT EXCHANGE  10/2014   exchange saline implants, B mastopexy/capsulorraphy ([facility])  . BUNIONECTOMY Bilateral yrs ago  . [place]   x 1  . CHOLECYSTECTOMY  06/11/2012   Procedure: LAPAROSCOPIC CHOLECYSTECTOMY WITH INTRAOPERATIVE CHOLANGIOGRAM;  Surgeon: [name], MD,FACS;  Location: [facility];  Service: General;  Laterality: N/A;  . COLONOSCOPY  02/2018   done for positive cologuard - WNL, rpt 10 yrs Fuller Plan)  . CYSTOSCOPY    . ERCP  05/22/2012   Procedure: ENDOSCOPIC RETROGRADE CHOLANGIOPANCREATOGRAPHY (ERCP);  Surgeon: Norberto Sorenson  Sindy Guadeloupe, MD,FACG;  Location: [facility];  Service: Endoscopy;  Laterality: N/A;  . ERCP N/A 09/17/2013   Procedure: ENDOSCOPIC RETROGRADE CHOLANGIOPANCREATOGRAPHY (ERCP);  Surgeon: [name], MD;  Location: [facility];  Service: Endoscopy;  Laterality: N/A;  . ERCP N/A 09/27/2018   Procedure: ENDOSCOPIC RETROGRADE CHOLANGIOPANCREATOGRAPHY (ERCP);  Surgeon: [name], MD;  Location: [facility];  Service: Endoscopy;  Laterality: N/A;  . ESOPHAGOGASTRODUODENOSCOPY  09/2016   WNL. esophagus dilated [facility] Plan)  . HEMORRHOID BANDING  09-23-13   --Dr. Greer Pickerel  . HERNIA REPAIR      inguinal  . HYSTEROSCOPY W/ ENDOMETRIAL ABLATION    . NASAL SINUS SURGERY     x5  . REMOVAL OF STONES  09/27/2018   Procedure: REMOVAL OF STONES;  Surgeon: [name], MD;  Location: [facility];  Service: Endoscopy;;  . [name]  09/27/2018   Procedure: SPHINCTEROTOMY;  Surgeon: [name], MD;  Location: [facility];  Service: Endoscopy;;  . TOTAL HIP ARTHROPLASTY Left 03/12/2019   Procedure: LEFT TOTAL HIP ARTHROPLASTY ANTERIOR APPROACH;  [name], [name], MD)  . 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 Influenza Split 05/04/2011, 04/04/2012  . Influenza Whole 05/25/2007, 04/28/2008, 04/30/2009, 03/26/2010  . Influenza,inj,Quad PF,6+ Mos 04/16/2015, 04/15/2016, 05/16/2017, 04/10/2018, 04/19/2019  . Influenza-Unspecified 03/25/2014  . MMR 11/28/2017  . Pneumococcal Conjugate-13 08/02/2013  . Pneumococcal Polysaccharide-23 07/30/2009  . Td 11/01/2005, 11/10/2016  . Zoster Recombinat (Shingrix) 07/21/2017, 11/03/2017     Objective: Vital Signs: BP 125/79 (BP Location: Left Arm, Patient Position: Sitting, Cuff Size: Normal)   Pulse 97   Resp 16   Ht _0  (1.626 m)   Wt 123 lb 12.8 oz (56.2 kg)   LMP 07/25/1998   BMI 21.25 kg/m    Physical Exam Vitals and nursing note reviewed.  Constitutional:      Appearance: She is well-developed.  HENT:     Head: Normocephalic and atraumatic.  Eyes:     Conjunctiva/sclera: Conjunctivae normal.  Cardiovascular:     Rate and Rhythm: Normal rate and regular rhythm.     Heart sounds: Normal heart sounds.  Pulmonary:      Effort: Pulmonary effort is normal.     Breath sounds: Normal breath sounds.  Abdominal:     General: Bowel sounds are normal.     Palpations: Abdomen is soft.  Musculoskeletal:     Cervical back: Normal range of motion.  Lymphadenopathy:     Cervical: No cervical adenopathy.  Skin:    General: Skin is warm and dry.     Capillary Refill: Capillary refill takes less than 2 seconds.  Neurological:     Mental Status: She is alert and oriented to person, place, and time.  Psychiatric:        Behavior: Behavior normal.      Musculoskeletal Exam: C-spine was in good range of motion.  She is in thoracic kyphosis and mild scoliosis.  Shoulder joints elbow joints wrist joints were in good range of motion.  She has bilateral PIP and DIP  thickening with no synovitis.  Hip joints, knee joints, ankles with good range of motion with no synovitis.  She has painful range of motion of bilateral knee joints.  No warmth swelling or effusion was noted.  CDAI Exam: CDAI Score: -- Patient Global: --; Provider Global: -- Swollen: --; Tender: -- Joint Exam 09/05/2019   No joint exam has been documented for this visit   There is currently no information documented on the homunculus. Go to the Rheumatology activity and complete the homunculus joint exam.  Investigation: No additional findings.  Imaging: XR KNEE 3 VIEW LEFT  Result Date: 09/05/2019 Mild medial compartment narrowing was noted.  Severe patellofemoral narrowing was noted.  No chondrocalcinosis was noted. Impression: These findings are consistent mild osteoarthritis and severe chondromalacia patella.  XR KNEE 3 VIEW RIGHT  Result Date: 09/05/2019 Mild to moderate medial compartment narrowing was noted.  No chondrocalcinosis was noted.  Severe patellofemoral narrowing was noted. Impression: These findings are consistent with mild to moderate osteoarthritis and severe chondromalacia patella.   Recent Labs: Lab Results  Component Value  Date   WBC 9.3 05/28/2019   HGB 12.7 05/28/2019   PLT 342.0 05/28/2019   NA 134 (L) 07/02/2019   K 4.4 07/02/2019   CL 97 07/02/2019   CO2 31 07/02/2019   GLUCOSE 96 07/02/2019   BUN 8 07/02/2019   CREATININE 0.55 07/02/2019   BILITOT 0.3 07/02/2019   ALKPHOS 109 07/02/2019   AST 15 07/02/2019   ALT 8 07/02/2019   PROT 6.6 07/02/2019   ALBUMIN 4.5 07/02/2019   CALCIUM 9.7 07/02/2019   GFRAA >60 03/13/2019    Speciality Comments: No specialty comments available.  Procedures:  Large Joint Inj: bilateral knee on 09/05/2019 1:40 PM Indications: pain Details: 27 G 1.5 in needle, medial approach  Arthrogram: No  Medications (Right): 1.5 mL lidocaine 1 %; 40 mg triamcinolone acetonide 40 MG/ML Medications (Left): 1.5 mL lidocaine 1 %; 40 mg triamcinolone acetonide 40 MG/ML Outcome: tolerated well, no immediate complications Procedure, treatment alternatives, risks and benefits explained, specific risks discussed. Consent was given by the patient. Immediately prior to procedure a time out was called to verify the correct patient, procedure, equipment, support staff and site/side marked as required. Patient was prepped and draped in the usual sterile fashion.     Allergies: Inh [isoniazid], Cymbalta [duloxetine hcl], Nucynta [tapentadol], Silenor [doxepin hcl], Benzoin, Celebrex [celecoxib], Codeine phosphate, Lithium, Meperidine hcl, and Sulfamethoxazole   Assessment / Plan:     Visit Diagnoses: Chronic pain of both knees -patient complains of pain and discomfort in her bilateral knee joints.  She states the cortisone injection lasted only for a couple of months.  She has had an adequate response to Visco supplement injections in the past.  Per patient's request we will inject her knee joints with the cortisone again.  Side effects were discussed.  Plan: XR KNEE 3 VIEW RIGHT, XR KNEE 3 VIEW LEFT.  X-rays show mild to moderate osteoarthritis and severe chondromalacia patella.  She will  benefit from regular exercises.  Have given her a handout on knee exercises.  I will also refer her to physical therapy.  Primary osteoarthritis of both knees-she has chronic discomfort.  S/P hip replacement, left-she had fairly good range of motion.  Trochanteric bursitis of both hips-currently not very symptomatic.  Primary osteoarthritis of both hands-she has Bouchard's and Heberden's nodes.  No synovitis was noted.  [name] disease without gangrene-currently not symptomatic.  DDD (degenerative disc disease), cervical-she had  fairly good range of motion of her cervical spine.  DDD (degenerative disc disease), lumbar-she has chronic mild discomfort.  Fibromyalgia-she continues to have generalized pain and discomfort.  She has been managed by pain clinic.  She recently started fentanyl patch.  CFS (chronic fatigue syndrome)  Chronic pain syndrome  Prediabetes  History of gastroesophageal reflux (GERD)  History of bipolar disorder  Hx of porphyria  Orders: Orders Placed This Encounter  Procedures  . Large Joint Inj  . XR KNEE 3 VIEW RIGHT  . XR KNEE 3 VIEW LEFT   No orders of the defined types were placed in this encounter.   Face-to-face time spent with patient was 30 minutes. Greater than 50% of time was spent in counseling and coordination of care.  Follow-Up Instructions: Return in about 6 months (around 03/04/2020) for Osteoarthritis.   Bo Merino, MD  Note - This record has been created using Editor, commissioning.  Chart creation errors have been sought, but may not always  have been located. Such creation errors do not reflect on  the standard of medical care.

## 2019-09-05 ENCOUNTER — Ambulatory Visit (INDEPENDENT_AMBULATORY_CARE_PROVIDER_SITE_OTHER): Payer: Medicare Other | Admitting: Rheumatology

## 2019-09-05 ENCOUNTER — Other Ambulatory Visit: Payer: Self-pay

## 2019-09-05 ENCOUNTER — Encounter: Payer: Self-pay | Admitting: Rheumatology

## 2019-09-05 ENCOUNTER — Ambulatory Visit: Payer: Self-pay

## 2019-09-05 VITALS — BP 125/79 | HR 97 | Resp 16 | Ht 64.0 in | Wt 123.8 lb

## 2019-09-05 DIAGNOSIS — R7303 Prediabetes: Secondary | ICD-10-CM

## 2019-09-05 DIAGNOSIS — Z8639 Personal history of other endocrine, nutritional and metabolic disease: Secondary | ICD-10-CM

## 2019-09-05 DIAGNOSIS — M25561 Pain in right knee: Secondary | ICD-10-CM | POA: Diagnosis not present

## 2019-09-05 DIAGNOSIS — R5382 Chronic fatigue, unspecified: Secondary | ICD-10-CM

## 2019-09-05 DIAGNOSIS — G894 Chronic pain syndrome: Secondary | ICD-10-CM

## 2019-09-05 DIAGNOSIS — I7300 Raynaud's syndrome without gangrene: Secondary | ICD-10-CM

## 2019-09-05 DIAGNOSIS — Z96642 Presence of left artificial hip joint: Secondary | ICD-10-CM

## 2019-09-05 DIAGNOSIS — Z8659 Personal history of other mental and behavioral disorders: Secondary | ICD-10-CM

## 2019-09-05 DIAGNOSIS — M5030 Other cervical disc degeneration, unspecified cervical region: Secondary | ICD-10-CM

## 2019-09-05 DIAGNOSIS — G8929 Other chronic pain: Secondary | ICD-10-CM

## 2019-09-05 DIAGNOSIS — M7062 Trochanteric bursitis, left hip: Secondary | ICD-10-CM

## 2019-09-05 DIAGNOSIS — M5136 Other intervertebral disc degeneration, lumbar region: Secondary | ICD-10-CM

## 2019-09-05 DIAGNOSIS — M7061 Trochanteric bursitis, right hip: Secondary | ICD-10-CM

## 2019-09-05 DIAGNOSIS — M25562 Pain in left knee: Secondary | ICD-10-CM

## 2019-09-05 DIAGNOSIS — Z8719 Personal history of other diseases of the digestive system: Secondary | ICD-10-CM

## 2019-09-05 DIAGNOSIS — M19042 Primary osteoarthritis, left hand: Secondary | ICD-10-CM

## 2019-09-05 DIAGNOSIS — M170 Bilateral primary osteoarthritis of knee: Secondary | ICD-10-CM

## 2019-09-05 DIAGNOSIS — M19041 Primary osteoarthritis, right hand: Secondary | ICD-10-CM

## 2019-09-05 DIAGNOSIS — G9332 Myalgic encephalomyelitis/chronic fatigue syndrome: Secondary | ICD-10-CM

## 2019-09-05 DIAGNOSIS — M797 Fibromyalgia: Secondary | ICD-10-CM

## 2019-09-05 MED ORDER — LIDOCAINE HCL 1 % IJ SOLN
1.5000 mL | INTRAMUSCULAR | Status: AC | PRN
  Administered 2019-09-05: 1.5 mL

## 2019-09-05 MED ORDER — TRIAMCINOLONE ACETONIDE 40 MG/ML IJ SUSP
40.0000 mg | INTRAMUSCULAR | Status: AC | PRN
  Administered 2019-09-05: 40 mg via INTRA_ARTICULAR

## 2019-09-05 NOTE — Patient Instructions (Signed)
Journal for Nurse Practitioners, 15(4), 263-267. Retrieved April 30, 2018 from http://clinicalkey.com/nursing">  Knee Exercises Ask your health care provider which exercises are safe for you. Do exercises exactly as told by your health care provider and adjust them as directed. It is normal to feel mild stretching, pulling, tightness, or discomfort as you do these exercises. Stop right away if you feel sudden pain or your pain gets worse. Do not begin these exercises until told by your health care provider. Stretching and range-of-motion exercises These exercises warm up your muscles and joints and improve the movement and flexibility of your knee. These exercises also help to relieve pain and swelling. Knee extension, prone 1. Lie on your abdomen (prone position) on a bed. 2. Place your left / right knee just beyond the edge of the surface so your knee is not on the bed. You can put a towel under your left / right thigh just above your kneecap for comfort. 3. Relax your leg muscles and allow gravity to straighten your knee (extension). You should feel a stretch behind your left / right knee. 4. Hold this position for __________ seconds. 5. Scoot up so your knee is supported between repetitions. Repeat __________ times. Complete this exercise __________ times a day. Knee flexion, active  1. Lie on your back with both legs straight. If this causes back discomfort, bend your left / right knee so your foot is flat on the floor. 2. Slowly slide your left / right heel back toward your buttocks. Stop when you feel a gentle stretch in the front of your knee or thigh (flexion). 3. Hold this position for __________ seconds. 4. Slowly slide your left / right heel back to the starting position. Repeat __________ times. Complete this exercise __________ times a day. Quadriceps stretch, prone  1. Lie on your abdomen on a firm surface, such as a bed or padded floor. 2. Bend your left / right knee and hold  your ankle. If you cannot reach your ankle or pant leg, loop a belt around your foot and grab the belt instead. 3. Gently pull your heel toward your buttocks. Your knee should not slide out to the side. You should feel a stretch in the front of your thigh and knee (quadriceps). 4. Hold this position for __________ seconds. Repeat __________ times. Complete this exercise __________ times a day. Hamstring, supine 1. Lie on your back (supine position). 2. Loop a belt or towel over the ball of your left / right foot. The ball of your foot is on the walking surface, right under your toes. 3. Straighten your left / right knee and slowly pull on the belt to raise your leg until you feel a gentle stretch behind your knee (hamstring). ? Do not let your knee bend while you do this. ? Keep your other leg flat on the floor. 4. Hold this position for __________ seconds. Repeat __________ times. Complete this exercise __________ times a day. Strengthening exercises These exercises build strength and endurance in your knee. Endurance is the ability to use your muscles for a long time, even after they get tired. Quadriceps, isometric This exercise stretches the muscles in front of your thigh (quadriceps) without moving your knee joint (isometric). 1. Lie on your back with your left / right leg extended and your other knee bent. Put a rolled towel or small pillow under your knee if told by your health care provider. 2. Slowly tense the muscles in the front of your left /   right thigh. You should see your kneecap slide up toward your hip or see increased dimpling just above the knee. This motion will push the back of the knee toward the floor. 3. For __________ seconds, hold the muscle as tight as you can without increasing your pain. 4. Relax the muscles slowly and completely. Repeat __________ times. Complete this exercise __________ times a day. Straight leg raises This exercise stretches the muscles in front  of your thigh (quadriceps) and the muscles that move your hips (hip flexors). 1. Lie on your back with your left / right leg extended and your other knee bent. 2. Tense the muscles in the front of your left / right thigh. You should see your kneecap slide up or see increased dimpling just above the knee. Your thigh may even shake a bit. 3. Keep these muscles tight as you raise your leg 4-6 inches (10-15 cm) off the floor. Do not let your knee bend. 4. Hold this position for __________ seconds. 5. Keep these muscles tense as you lower your leg. 6. Relax your muscles slowly and completely after each repetition. Repeat __________ times. Complete this exercise __________ times a day. Hamstring, isometric 1. Lie on your back on a firm surface. 2. Bend your left / right knee about __________ degrees. 3. Dig your left / right heel into the surface as if you are trying to pull it toward your buttocks. Tighten the muscles in the back of your thighs (hamstring) to "dig" as hard as you can without increasing any pain. 4. Hold this position for __________ seconds. 5. Release the tension gradually and allow your muscles to relax completely for __________ seconds after each repetition. Repeat __________ times. Complete this exercise __________ times a day. Hamstring curls If told by your health care provider, do this exercise while wearing ankle weights. Begin with __________ lb weights. Then increase the weight by 1 lb (0.5 kg) increments. Do not wear ankle weights that are more than __________ lb. 1. Lie on your abdomen with your legs straight. 2. Bend your left / right knee as far as you can without feeling pain. Keep your hips flat against the floor. 3. Hold this position for __________ seconds. 4. Slowly lower your leg to the starting position. Repeat __________ times. Complete this exercise __________ times a day. Squats This exercise strengthens the muscles in front of your thigh and knee  (quadriceps). 1. Stand in front of a table, with your feet and knees pointing straight ahead. You may rest your hands on the table for balance but not for support. 2. Slowly bend your knees and lower your hips like you are going to sit in a chair. ? Keep your weight over your heels, not over your toes. ? Keep your lower legs upright so they are parallel with the table legs. ? Do not let your hips go lower than your knees. ? Do not bend lower than told by your health care provider. ? If your knee pain increases, do not bend as low. 3. Hold the squat position for __________ seconds. 4. Slowly push with your legs to return to standing. Do not use your hands to pull yourself to standing. Repeat __________ times. Complete this exercise __________ times a day. Wall slides This exercise strengthens the muscles in front of your thigh and knee (quadriceps). 1. Lean your back against a smooth wall or door, and walk your feet out 18-24 inches (46-61 cm) from it. 2. Place your feet hip-width apart. 3.   Slowly slide down the wall or door until your knees bend __________ degrees. Keep your knees over your heels, not over your toes. Keep your knees in line with your hips. 4. Hold this position for __________ seconds. Repeat __________ times. Complete this exercise __________ times a day. Straight leg raises This exercise strengthens the muscles that rotate the leg at the hip and move it away from your body (hip abductors). 1. Lie on your side with your left / right leg in the top position. Lie so your head, shoulder, knee, and hip line up. You may bend your bottom knee to help you keep your balance. 2. Roll your hips slightly forward so your hips are stacked directly over each other and your left / right knee is facing forward. 3. Leading with your heel, lift your top leg 4-6 inches (10-15 cm). You should feel the muscles in your outer hip lifting. ? Do not let your foot drift forward. ? Do not let your knee  roll toward the ceiling. 4. Hold this position for __________ seconds. 5. Slowly return your leg to the starting position. 6. Let your muscles relax completely after each repetition. Repeat __________ times. Complete this exercise __________ times a day. Straight leg raises This exercise stretches the muscles that move your hips away from the front of the pelvis (hip extensors). 1. Lie on your abdomen on a firm surface. You can put a pillow under your hips if that is more comfortable. 2. Tense the muscles in your buttocks and lift your left / right leg about 4-6 inches (10-15 cm). Keep your knee straight as you lift your leg. 3. Hold this position for __________ seconds. 4. Slowly lower your leg to the starting position. 5. Let your leg relax completely after each repetition. Repeat __________ times. Complete this exercise __________ times a day. This information is not intended to replace advice given to you by your health care provider. Make sure you discuss any questions you have with your health care provider. Document Revised: 05/01/2018 Document Reviewed: 05/01/2018 [employer]vier Patient Education  2020 [employer]  

## 2019-09-09 ENCOUNTER — Other Ambulatory Visit: Payer: Self-pay | Admitting: Family Medicine

## 2019-09-09 NOTE — Telephone Encounter (Signed)
ERx 

## 2019-09-20 ENCOUNTER — Other Ambulatory Visit: Payer: Self-pay | Admitting: Family Medicine

## 2019-09-20 ENCOUNTER — Encounter: Payer: Self-pay | Admitting: Family Medicine

## 2019-09-20 NOTE — Telephone Encounter (Signed)
See Refill, today.

## 2019-09-20 NOTE — Telephone Encounter (Signed)
Note from pharmacy: One patch came off in the shower so refill is a little early.  Name of Medication: Fentanyl patch Name of Pharmacy: [facility] or Written Date and Quantity: 08/27/19, #5 patch (pharmacy req #10 patch) Last Office Visit and Type: 08/27/19, pain mngmt f/u Next Office Visit and Type: 10/08/19, 3 mo f/u Last Controlled Substance Agreement Date: 04/19/19 Last UDS: 04/19/19

## 2019-09-20 NOTE — Telephone Encounter (Signed)
ERx 

## 2019-09-21 NOTE — Telephone Encounter (Signed)
Rx refilled yesterday.

## 2019-09-22 MED ORDER — FENTANYL 25 MCG/HR TD PT72: MEDICATED_PATCH | TRANSDERMAL | 0 refills | Status: DC

## 2019-09-22 NOTE — Addendum Note (Signed)
Addended by: Ria Bush on: 09/22/2019 01:13 PM   Modules accepted: Orders

## 2019-09-24 ENCOUNTER — Ambulatory Visit: Payer: Medicare Other | Admitting: Physical Therapy

## 2019-09-24 ENCOUNTER — Other Ambulatory Visit: Payer: Self-pay | Admitting: *Deleted

## 2019-09-24 MED ORDER — AMLODIPINE BESYLATE 5 MG PO TABS: 5.0000 mg | ORAL_TABLET | Freq: Every day | ORAL | 2 refills | Status: DC

## 2019-09-24 NOTE — Telephone Encounter (Signed)
Refill request received via fax  Last Visit: 09/05/19 Next Visit: 03/04/20  Okay to refill per Dr. Estanislado Pandy

## 2019-10-01 ENCOUNTER — Ambulatory Visit: Payer: Medicare Other | Admitting: Physical Therapy

## 2019-10-01 ENCOUNTER — Other Ambulatory Visit: Payer: Self-pay | Admitting: Family Medicine

## 2019-10-01 NOTE — Telephone Encounter (Signed)
Zofran ODT Last filled:  03/19/19, #20 Last OV:  08/27/19, chronic pain mngmt Next OV:  10/08/19, 3 mo chronic pain mngmt

## 2019-10-02 ENCOUNTER — Encounter: Payer: Self-pay | Admitting: Family Medicine

## 2019-10-02 ENCOUNTER — Ambulatory Visit (INDEPENDENT_AMBULATORY_CARE_PROVIDER_SITE_OTHER): Payer: Medicare Other | Admitting: Family Medicine

## 2019-10-02 ENCOUNTER — Other Ambulatory Visit: Payer: Self-pay

## 2019-10-02 VITALS — BP 130/80 | HR 104 | Temp 97.6°F | Ht 64.0 in | Wt 120.3 lb

## 2019-10-02 DIAGNOSIS — M5136 Other intervertebral disc degeneration, lumbar region: Secondary | ICD-10-CM

## 2019-10-02 DIAGNOSIS — M5010 Cervical disc disorder with radiculopathy, unspecified cervical region: Secondary | ICD-10-CM

## 2019-10-02 DIAGNOSIS — F331 Major depressive disorder, recurrent, moderate: Secondary | ICD-10-CM | POA: Diagnosis not present

## 2019-10-02 DIAGNOSIS — R5382 Chronic fatigue, unspecified: Secondary | ICD-10-CM

## 2019-10-02 DIAGNOSIS — K5903 Drug induced constipation: Secondary | ICD-10-CM | POA: Diagnosis not present

## 2019-10-02 DIAGNOSIS — G8929 Other chronic pain: Secondary | ICD-10-CM

## 2019-10-02 DIAGNOSIS — J029 Acute pharyngitis, unspecified: Secondary | ICD-10-CM | POA: Diagnosis not present

## 2019-10-02 DIAGNOSIS — M51369 Other intervertebral disc degeneration, lumbar region without mention of lumbar back pain or lower extremity pain: Secondary | ICD-10-CM

## 2019-10-02 DIAGNOSIS — M5030 Other cervical disc degeneration, unspecified cervical region: Secondary | ICD-10-CM

## 2019-10-02 DIAGNOSIS — M797 Fibromyalgia: Secondary | ICD-10-CM

## 2019-10-02 DIAGNOSIS — G894 Chronic pain syndrome: Secondary | ICD-10-CM

## 2019-10-02 DIAGNOSIS — G9332 Myalgic encephalomyelitis/chronic fatigue syndrome: Secondary | ICD-10-CM

## 2019-10-02 MED ORDER — TRAMADOL HCL ER 100 MG PO TB24: 100.0000 mg | ORAL_TABLET | Freq: Every day | ORAL | 0 refills | Status: DC

## 2019-10-02 NOTE — Patient Instructions (Addendum)
Take fentanyl patch off tomorrow morning.  Friday morning start tramadol ER 100mg  24 hours after removing patch.  May supplement with tramadol 50mg  tablets 3 times a day as needed, 1/2 tablet on first dose.  Update me with how much immediate release tramadol we're using.   Return in 1 month for follow up visit.

## 2019-10-02 NOTE — Assessment & Plan Note (Signed)
Sees psych on lamictal, abilify, and xanax

## 2019-10-02 NOTE — Assessment & Plan Note (Signed)
This is improving off kadian, anticipate continued improvement off fentanyl.  Benefits from linzess, with PRN movantik.

## 2019-10-02 NOTE — Assessment & Plan Note (Addendum)
[place] CSRS reviewed.  Unfortunately 3 separate extended release morphine formulations (she has tolerated well) have been discontinued. She did not do well with MS Contin.  Now struggling with transition to fentanyl, intolerable side effects. Will transition off this and onto tramadol ER 100mg  daily. I touched base with [facility] pharmacist regarding transition. Will stop fentanyl patch tomorrow morning (placed today), then wait 24 hours prior to starting tramadol ER 100mg  (sent to pharmacy). She has tramadol IR 50mg  TID PRN available for breakthrough pain. Advised once ER formulation started, start with 25mg  tramadol IR for breakthrough pain. Pt agrees with plan.

## 2019-10-02 NOTE — Assessment & Plan Note (Signed)
See below for transition from fentanyl patch to tramadol ER.

## 2019-10-02 NOTE — Assessment & Plan Note (Addendum)
Ongoing for 6 weeks. Benign limited pharyngeal exam. Do recommend ENT eval - she will call.

## 2019-10-02 NOTE — Progress Notes (Signed)
This visit was conducted in person.  BP 130/80 (BP Location: Left Arm, Patient Position: Sitting, Cuff Size: Normal)   Pulse (!) 104   Temp 97.6 F (36.4 C) (Temporal)   Ht 5\' 4"  (1.626 m)   Wt 120 lb 5 oz (54.6 kg)   LMP 07/25/1998   SpO2 97%   BMI 20.65 kg/m    CC: 3 mo f/u visit  Subjective:    Patient ID: Rachel Duran, female    DOB: 01-20-1956, 64 y.o.   MRN: [id number]  HPI: Rachel Duran is a 64 y.o. female presenting on 10/02/2019 for Follow-up (Here for 3 mo f/u.  Wants to discuss pain patch. )   See prior note for details.  H/o opiate dependent chronic pain due to fibromyalgia, ostearthritis, interstitial cystitis, and DDD  Prior on Avinza until this was discontinued.  Did not tolerate nucynta (05/2015). Embeda effective for years, then discontinued late 2019. MS Contin not effective for pain control, Kadian started 08/2018 - discontinued 08/2019 (no longer manufactured).   Last visit we transitioned to fentanyl as long acting opiate.  Prior MMED = 80, equivalent to fentanyl 29mcg/day.  We started 57mcg patch Q72 hours.  She notes marked cold intolerance since starting fentanyl - worse on the 3rd day of the patch - associated with decreased pain control effect. Also notes worsening trouble sleeping and worsening fatigue and muscle twitches of both arms at night time that can last for hours. Doesn't think she will be able to tolerate ongoing fentanyl use.  She has been taking tramadol 50mg  2-3 times a day - takes the edge off.  Notes irritation to back of throat on left side over the last 6 wks. Globus sensation. Happening more frequently. No hoarse voice. Dry throat leading to coughing fits. Notes some difficulty swallowing. H/o esophageal dilation.      Relevant past medical, surgical, family and social history reviewed and updated as indicated. Interim medical history since our last visit reviewed. Allergies and medications reviewed and  updated. Outpatient Medications Prior to Visit  Medication Sig Dispense Refill  . ALPRAZolam (XANAX) 1 MG tablet Take 0.5 mg by mouth at bedtime.   0  . AMBULATORY NON FORMULARY MEDICATION Medication Name: Gi cocktail 5-10 ml every 4-6 hours as needed for pain. 450 mL 1  . amLODipine (NORVASC) 5 MG tablet Take 1 tablet (5 mg total) by mouth daily. 30 tablet 2  . amphetamine-dextroamphetamine (ADDERALL XR) 30 MG 24 hr capsule TAKE 1 CAPSULE BY MOUTH EVERY MORNING 90 capsule 0  . ARIPiprazole (ABILIFY) 5 MG tablet Take 5 mg by mouth at bedtime.     . calcium-vitamin D (OSCAL WITH D) 500-200 MG-UNIT tablet Take 1 tablet by mouth 2 (two) times daily.    . cyanocobalamin (,VITAMIN B-12,) 1000 MCG/ML injection INJECT 1 ML IM EVERY 21 DAYS 1 mL 6  . cyclobenzaprine (FLEXERIL) 5 MG tablet TAKE ONE TABLET BY MOUTH 3 TIMES DAILY AS NEEDED FOR MUSCLE SPASMS 30 tablet 1  . diclofenac sodium (VOLTAREN) 1 % GEL Apply 2-4 grams to affected joint up to 4 times daily. 400 g 4  . Estradiol (YUVAFEM) 10 MCG TABS vaginal tablet Place 1 tablet (10 mcg total) vaginally 3 (three) times a week. Please keep on file. (Patient taking differently: Place 1 tablet vaginally See admin instructions. Place 1 tablet vaginally Tuesday, Thursday and Saturday.) 12 tablet 10  . Eszopiclone 3 MG TABS TAKE ONE TABLET AT BEDTIME 90 tablet 0  .  ferrous sulfate 325 (65 FE) MG tablet Take 325 mg by mouth every Monday, Wednesday, and Friday.     . fluticasone (FLONASE) 50 MCG/ACT nasal spray Place 2 sprays into both nostrils daily as needed (seasonal allergies). (Patient taking differently: Place 1 spray into both nostrils daily as needed for allergies. ) 16 g 5  . lamoTRIgine (LAMICTAL) 100 MG tablet Take 100 mg by mouth daily.    Marland Kitchen LINZESS 290 MCG CAPS capsule TAKE 1 CAPSULE BY MOUTH EVERY DAY 90 capsule 1  . methocarbamol (ROBAXIN) 500 MG tablet Take 1 tablet (500 mg total) by mouth every 6 (six) hours as needed for muscle spasms. 40  tablet 1  . MOVANTIK 25 MG TABS tablet Take 25 mg by mouth daily.    . NONFORMULARY OR COMPOUNDED ITEM Testosterone propionate 2% in white petrolatum, apply small amount once daily for 5 days per week. (Patient taking differently: Apply 1 application topically See admin instructions. Testosterone propionate 2% in white petrolatum, Apply small amount topically at bedtime on Tuesday, Thursday and Saturday.) 60 each 2  . ondansetron (ZOFRAN ODT) 4 MG disintegrating tablet Take 1 tablet (4 mg total) by mouth every 8 (eight) hours as needed for nausea or vomiting. 20 tablet 1  . ondansetron (ZOFRAN) 4 MG tablet TAKE ONE TABLET EVERY 8 HOURS AS NEEDED FOR NAUSEA OR VOMITING 30 tablet 1  . pantoprazole (PROTONIX) 40 MG tablet Take 1 tablet (40 mg total) by mouth 2 (two) times daily before a meal. 60 tablet 5  . pentosan polysulfate (ELMIRON) 100 MG capsule Take 200 mg by mouth 2 (two) times daily.     . polyethylene glycol (MIRALAX) 17 g packet Take 17 g by mouth 3 (three) times daily.     . pregabalin (LYRICA) 150 MG capsule TAKE 1 CAPSULE BY MOUTH TWICE DAILY 180 capsule 0  . senna (SENOKOT) 8.6 MG tablet Take 2 tablets by mouth daily as needed.     . SF 5000 PLUS 1.1 % CREA dental cream Place 1 application onto teeth at bedtime.     . traMADol (ULTRAM) 50 MG tablet Take 1 tablet (50 mg total) by mouth 3 (three) times daily as needed for moderate pain. 70 tablet 0  . ursodiol (ACTIGALL) 300 MG capsule TAKE 1 CAPSULE TWICE DAILY 60 capsule 5  . fentaNYL (DURAGESIC) 25 MCG/HR APPLY ONE PATCH EVERY THREE DAYS 10 patch 0   No facility-administered medications prior to visit.     Per HPI unless specifically indicated in ROS section below Review of Systems Objective:    BP 130/80 (BP Location: Left Arm, Patient Position: Sitting, Cuff Size: Normal)   Pulse (!) 104   Temp 97.6 F (36.4 C) (Temporal)   Ht 5\' 4"  (1.626 m)   Wt 120 lb 5 oz (54.6 kg)   LMP 07/25/1998   SpO2 97%   BMI 20.65 kg/m   Wt  Readings from Last 3 Encounters:  10/02/19 120 lb 5 oz (54.6 kg)  09/05/19 123 lb 12.8 oz (56.2 kg)  08/27/19 118 lb (53.5 kg)    Physical Exam Vitals and nursing note reviewed.  Constitutional:      Appearance: Normal appearance. She is not ill-appearing.  HENT:     Mouth/Throat:     Lips: Pink.     Mouth: Mucous membranes are moist. No injury.     Tongue: No lesions.     Palate: No mass.     Pharynx: Oropharynx is clear. Uvula midline. No oropharyngeal  exudate or posterior oropharyngeal erythema.  Neck:     Thyroid: No thyromegaly or thyroid tenderness.  Musculoskeletal:     Cervical back: Normal range of motion and neck supple.  Lymphadenopathy:     Cervical: No cervical adenopathy.  Neurological:     Mental Status: She is alert.       Lab Results  Component Value Date   CREATININE 0.55 07/02/2019   BUN 8 07/02/2019   NA 134 (L) 07/02/2019   K 4.4 07/02/2019   CL 97 07/02/2019   CO2 31 07/02/2019    Assessment & Plan:  This visit occurred during the SARS-CoV-2 public health emergency.  Safety protocols were in place, including screening questions prior to the visit, additional usage of staff PPE, and extensive cleaning of exam room while observing appropriate contact time as indicated for disinfecting solutions.   Problem List Items Addressed This Visit    Sore throat    Ongoing for 6 weeks. Benign limited pharyngeal exam. Do recommend ENT eval - she will call.       MDD (major depressive disorder), recurrent episode, moderate (HCC)    Sees psych on lamictal, abilify, and xanax       Fibromyalgia   Relevant Medications   traMADol (ULTRAM-ER) 100 MG 24 hr tablet   Encounter for chronic pain management - Primary    [place] CSRS reviewed.  Unfortunately 3 separate extended release morphine formulations (she has tolerated well) have been discontinued. She did not do well with MS Contin.  Now struggling with transition to fentanyl, intolerable side effects. Will  transition off this and onto tramadol ER 100mg  daily. I touched base with [facility] pharmacist regarding transition. Will stop fentanyl patch tomorrow morning (placed today), then wait 24 hours prior to starting tramadol ER 100mg  (sent to pharmacy). She has tramadol IR 50mg  TID PRN available for breakthrough pain. Advised once ER formulation started, start with 25mg  tramadol IR for breakthrough pain. Pt agrees with plan.       Drug-induced constipation    This is improving off kadian, anticipate continued improvement off fentanyl.  Benefits from linzess, with PRN movantik.       DDD (degenerative disc disease), lumbar   Relevant Medications   traMADol (ULTRAM-ER) 100 MG 24 hr tablet   DDD (degenerative disc disease), cervical   Relevant Medications   traMADol (ULTRAM-ER) 100 MG 24 hr tablet   Chronic pain syndrome    See below for transition from fentanyl patch to tramadol ER.       Relevant Medications   traMADol (ULTRAM-ER) 100 MG 24 hr tablet   CFS (chronic fatigue syndrome)   Cervical disc disorder with radiculopathy of cervical region       Meds ordered this encounter  Medications  . traMADol (ULTRAM-ER) 100 MG 24 hr tablet    Sig: Take 1 tablet (100 mg total) by mouth daily.    Dispense:  30 tablet    Refill:  0    To replace fentanyl patch   No orders of the defined types were placed in this encounter.   Patient Instructions  Take fentanyl patch off tomorrow morning.  Friday morning start tramadol ER 100mg  24 hours after removing patch.  May supplement with tramadol 50mg  tablets 3 times a day as needed, 1/2 tablet on first dose.  Update me with how much immediate release tramadol we're using.   Return in 1 month for follow up visit.    Follow up plan: Return in about 4  weeks (around 10/30/2019), or if symptoms worsen or fail to improve, for follow up visit.  Ria Bush, MD

## 2019-10-04 ENCOUNTER — Encounter: Payer: Self-pay | Admitting: Family Medicine

## 2019-10-04 ENCOUNTER — Telehealth: Payer: Self-pay | Admitting: Rheumatology

## 2019-10-04 NOTE — Telephone Encounter (Signed)
Patient called stating Dr. Estanislado Pandy usually prescribes a 90 day supply of Norvasc and the last refill was only 30 days.  Patient is requesting when it is time to refill in April that she send a 90 day supply to [facility].

## 2019-10-04 NOTE — Telephone Encounter (Signed)
Attempted to contact the patient and left message for patient to call the office.  

## 2019-10-08 ENCOUNTER — Encounter: Payer: Medicare Other | Admitting: Physical Therapy

## 2019-10-08 ENCOUNTER — Ambulatory Visit: Payer: Medicare Other | Admitting: Family Medicine

## 2019-10-08 ENCOUNTER — Ambulatory Visit (INDEPENDENT_AMBULATORY_CARE_PROVIDER_SITE_OTHER): Payer: Medicare Other | Admitting: Family Medicine

## 2019-10-08 ENCOUNTER — Other Ambulatory Visit: Payer: Self-pay

## 2019-10-08 ENCOUNTER — Encounter: Payer: Self-pay | Admitting: Family Medicine

## 2019-10-08 VITALS — BP 146/82 | HR 105 | Temp 97.6°F | Ht 64.0 in | Wt 119.5 lb

## 2019-10-08 DIAGNOSIS — G8929 Other chronic pain: Secondary | ICD-10-CM

## 2019-10-08 DIAGNOSIS — G894 Chronic pain syndrome: Secondary | ICD-10-CM | POA: Diagnosis not present

## 2019-10-08 DIAGNOSIS — T887XXA Unspecified adverse effect of drug or medicament, initial encounter: Secondary | ICD-10-CM | POA: Diagnosis not present

## 2019-10-08 MED ORDER — MORPHINE SULFATE 15 MG PO TABS: 15.0000 mg | ORAL_TABLET | Freq: Four times a day (QID) | ORAL | 0 refills | Status: DC | PRN

## 2019-10-08 NOTE — Progress Notes (Signed)
This visit was conducted in person.  BP (!) 146/82 (BP Location: Right Arm, Patient Position: Sitting, Cuff Size: Normal)   Pulse (!) 105   Temp 97.6 F (36.4 C) (Temporal)   Ht 5\' 4"  (1.626 m)   Wt 119 lb 8 oz (54.2 kg)   LMP 07/25/1998   SpO2 97%   BMI 20.51 kg/m    CC: pain management side effects Subjective:    Patient ID: Rachel Duran, female    DOB: [birth year], 64 y.o.   MRN: [id number]  HPI: Rachel Duran is a 64 y.o. female presenting on 10/08/2019 for Medication Reaction (Wants to discuss worsening effects from Ultram. )   See prior note for details.  Current pain regimen is tramadol ER 100mg  daily with tramadol IR 25-50mg  1/2-1 tab BID (has taken 1/2 tab 1-2 times a day) - this was started on 10/02/2019. Unfortunately didn't really find this regimen provide adequate pain relief.   Worsening side effects noted - muscle twitching, muscle rigidity, restlessness, chills/shivers, insomnia due to restless energy - 1/2 xanax and lunesta (chronic regimen) not helping insomnia. Ongoing fatigue.   She worries she may be experiencing serotonin syndrome.      Relevant past medical, surgical, family and social history reviewed and updated as indicated. Interim medical history since our last visit reviewed. Allergies and medications reviewed and updated. Outpatient Medications Prior to Visit  Medication Sig Dispense Refill  . ALPRAZolam (XANAX) 1 MG tablet Take 0.5 mg by mouth at bedtime.   0  . AMBULATORY NON FORMULARY MEDICATION Medication Name: Gi cocktail 5-10 ml every 4-6 hours as needed for pain. 450 mL 1  . amLODipine (NORVASC) 5 MG tablet Take 1 tablet (5 mg total) by mouth daily. 30 tablet 2  . amphetamine-dextroamphetamine (ADDERALL XR) 30 MG 24 hr capsule TAKE 1 CAPSULE BY MOUTH EVERY MORNING 90 capsule 0  . ARIPiprazole (ABILIFY) 5 MG tablet Take 5 mg by mouth at bedtime.     . calcium-vitamin D (OSCAL WITH D) 500-200 MG-UNIT tablet Take 1 tablet by  mouth 2 (two) times daily.    . cyanocobalamin (,VITAMIN B-12,) 1000 MCG/ML injection INJECT 1 ML IM EVERY 21 DAYS 1 mL 6  . cyclobenzaprine (FLEXERIL) 5 MG tablet TAKE ONE TABLET BY MOUTH 3 TIMES DAILY AS NEEDED FOR MUSCLE SPASMS 30 tablet 1  . diclofenac sodium (VOLTAREN) 1 % GEL Apply 2-4 grams to affected joint up to 4 times daily. 400 g 4  . Estradiol (YUVAFEM) 10 MCG TABS vaginal tablet Place 1 tablet (10 mcg total) vaginally 3 (three) times a week. Please keep on file. (Patient taking differently: Place 1 tablet vaginally See admin instructions. Place 1 tablet vaginally Tuesday, Thursday and Saturday.) 12 tablet 10  . Eszopiclone 3 MG TABS TAKE ONE TABLET AT BEDTIME 90 tablet 0  . ferrous sulfate 325 (65 FE) MG tablet Take 325 mg by mouth every Monday, Wednesday, and Friday.     . fluticasone (FLONASE) 50 MCG/ACT nasal spray Place 2 sprays into both nostrils daily as needed (seasonal allergies). (Patient taking differently: Place 1 spray into both nostrils daily as needed for allergies. ) 16 g 5  . lamoTRIgine (LAMICTAL) 100 MG tablet Take 100 mg by mouth daily.    Marland Kitchen LINZESS 290 MCG CAPS capsule TAKE 1 CAPSULE BY MOUTH EVERY DAY 90 capsule 1  . methocarbamol (ROBAXIN) 500 MG tablet Take 1 tablet (500 mg total) by mouth every 6 (six) hours as needed for muscle spasms.  40 tablet 1  . MOVANTIK 25 MG TABS tablet Take 25 mg by mouth daily.    . NONFORMULARY OR COMPOUNDED ITEM Testosterone propionate 2% in white petrolatum, apply small amount once daily for 5 days per week. (Patient taking differently: Apply 1 application topically See admin instructions. Testosterone propionate 2% in white petrolatum, Apply small amount topically at bedtime on Tuesday, Thursday and Saturday.) 60 each 2  . ondansetron (ZOFRAN) 4 MG tablet TAKE ONE TABLET EVERY 8 HOURS AS NEEDED FOR NAUSEA OR VOMITING 30 tablet 1  . ondansetron (ZOFRAN-ODT) 4 MG disintegrating tablet TAKE 1 TABLET BY MOUTH EVERY 8 HOURS AS NEEDED FOR  NAUSEA OR VOMITING 20 tablet 1  . pantoprazole (PROTONIX) 40 MG tablet Take 1 tablet (40 mg total) by mouth 2 (two) times daily before a meal. 60 tablet 5  . pentosan polysulfate (ELMIRON) 100 MG capsule Take 200 mg by mouth 2 (two) times daily.     . polyethylene glycol (MIRALAX) 17 g packet Take 17 g by mouth 3 (three) times daily.     . pregabalin (LYRICA) 150 MG capsule TAKE 1 CAPSULE BY MOUTH TWICE DAILY 180 capsule 0  . senna (SENOKOT) 8.6 MG tablet Take 2 tablets by mouth daily as needed.     . SF 5000 PLUS 1.1 % CREA dental cream Place 1 application onto teeth at bedtime.     . ursodiol (ACTIGALL) 300 MG capsule TAKE 1 CAPSULE TWICE DAILY 60 capsule 5  . traMADol (ULTRAM) 50 MG tablet Take 1 tablet (50 mg total) by mouth 3 (three) times daily as needed for moderate pain. 70 tablet 0  . traMADol (ULTRAM-ER) 100 MG 24 hr tablet Take 1 tablet (100 mg total) by mouth daily. 30 tablet 0   No facility-administered medications prior to visit.     Per HPI unless specifically indicated in ROS section below Review of Systems Objective:    BP (!) 146/82 (BP Location: Right Arm, Patient Position: Sitting, Cuff Size: Normal)   Pulse (!) 105   Temp 97.6 F (36.4 C) (Temporal)   Ht 5\' 4"  (1.626 m)   Wt 119 lb 8 oz (54.2 kg)   LMP 07/25/1998   SpO2 97%   BMI 20.51 kg/m   Wt Readings from Last 3 Encounters:  10/08/19 119 lb 8 oz (54.2 kg)  10/02/19 120 lb 5 oz (54.6 kg)  09/05/19 123 lb 12.8 oz (56.2 kg)    Physical Exam Vitals and nursing note reviewed.  Constitutional:      Appearance: Normal appearance.  Eyes:     Extraocular Movements: Extraocular movements intact.     Pupils: Pupils are equal, round, and reactive to light.  Cardiovascular:     Rate and Rhythm: Regular rhythm. Tachycardia present.     Pulses: Normal pulses.     Heart sounds: Normal heart sounds. No murmur.  Pulmonary:     Effort: Pulmonary effort is normal. No respiratory distress.     Breath sounds: Normal  breath sounds. No wheezing, rhonchi or rales.  Musculoskeletal:     Right lower leg: No edema.     Left lower leg: No edema.  Skin:    General: Skin is warm and dry.     Capillary Refill: Capillary refill takes less than 2 seconds.     Findings: No rash.  Neurological:     General: No focal deficit present.     Mental Status: She is alert.     Sensory: Sensation is intact.  Coordination: Coordination is intact. Romberg sign negative. Finger-Nose-Finger Test normal.     Gait: Gait is intact.     Comments: No tremor appreciated  Psychiatric:        Mood and Affect: Mood normal.        Behavior: Behavior normal.       Results for orders placed or performed in visit on 07/02/19  Hemoglobin A1c  Result Value Ref Range   Hgb A1c MFr Bld 6.1 4.6 - 6.5 %  Ferritin  Result Value Ref Range   Ferritin 62.1 10.0 - 291.0 ng/mL  IBC panel  Result Value Ref Range   Iron 56 42 - 145 ug/dL   Transferrin 285.0 212.0 - 360.0 mg/dL   Saturation Ratios 14.0 (L) 20.0 - 50.0 %  TSH  Result Value Ref Range   TSH 3.41 0.35 - 4.50 uIU/mL  Comprehensive metabolic panel  Result Value Ref Range   Sodium 134 (L) 135 - 145 mEq/L   Potassium 4.4 3.5 - 5.1 mEq/L   Chloride 97 96 - 112 mEq/L   CO2 31 19 - 32 mEq/L   Glucose, Bld 96 70 - 99 mg/dL   BUN 8 6 - 23 mg/dL   Creatinine, Ser 0.55 0.40 - 1.20 mg/dL   Total Bilirubin 0.3 0.2 - 1.2 mg/dL   Alkaline Phosphatase 109 39 - 117 U/L   AST 15 0 - 37 U/L   ALT 8 0 - 35 U/L   Total Protein 6.6 6.0 - 8.3 g/dL   Albumin 4.5 3.5 - 5.2 g/dL   GFR 112.20 >60.00 mL/min   Calcium 9.7 8.4 - 10.5 mg/dL  Lipid panel  Result Value Ref Range   Cholesterol 219 (H) 0 - 200 mg/dL   Triglycerides 157.0 (H) 0.0 - 149.0 mg/dL   HDL 67.90 >39.00 mg/dL   VLDL 31.4 0.0 - 40.0 mg/dL   LDL Cholesterol 120 (H) 0 - 99 mg/dL   Total CHOL/HDL Ratio 3    NonHDL 151.24    Assessment & Plan:  This visit occurred during the SARS-CoV-2 public health emergency.  Safety  protocols were in place, including screening questions prior to the visit, additional usage of staff PPE, and extensive cleaning of exam room while observing appropriate contact time as indicated for disinfecting solutions.   Problem List Items Addressed This Visit    Side effect of medication - Primary    Anticipate serotonin excess with recent addition of tramadol ER, but doesn't seem to meet criteria for serotonin syndrome. Regardless will recommend stop tramadol ER, will refer to pain management for further assistance. Pt agrees with plan.       Relevant Orders   Ambulatory referral to Pain Clinic   Encounter for chronic pain management    Stop tramadol ER, tramadol IR.  Previous pain regimen (prior to 08/2019) was 80 MMED, and when we switched to fentanyl last month we decreased her dose to 60 MMED (+ tramadol IR PRN). Change done because Kadian was discontined (which she did well with).  Now not tolerating tramadol ER (she is on other serotonergic agents).  Will start MSIR 15mg  QID with option to increase to 30mg  if needed. #120 provided today. I asked her to contact me in 1 month.  Other intolerances/ allergies limit pain regimen as far as I know. I have discussed referral to pain management for an evaluation and then pending their recommendation she may follow with me or with them for ongoing chronic pain management. She agrees  with plan.       Relevant Orders   Ambulatory referral to Pain Clinic   Chronic pain syndrome   Relevant Medications   morphine (MSIR) 15 MG tablet   Other Relevant Orders   Ambulatory referral to Pain Clinic       Meds ordered this encounter  Medications  . morphine (MSIR) 15 MG tablet    Sig: Take 1-2 tablets (15-30 mg total) by mouth every 6 (six) hours as needed for moderate pain.    Dispense:  120 tablet    Refill:  0    To replace tramadol ER and IR   Orders Placed This Encounter  Procedures  . Ambulatory referral to Pain Clinic    Referral  Priority:   Routine    Referral Type:   Consultation    Referral Reason:   Specialty Services Required    Requested Specialty:   Pain Medicine    Number of Visits Requested:   1    Patient Instructions  I agree possibly too much serotonin - stop tramadol ER and IR forms.  We will start Morphine IR - take 15mg  every 6 hours as needed. If 15mg  dose not controlling pain, may try 30mg  at a time.  We will refer you to pain management for evaluation.  We will call you for this appointment.  Keep follow up appointment next month.    Follow up plan: No follow-ups on file.  Ria Bush, MD

## 2019-10-08 NOTE — Patient Instructions (Addendum)
I agree possibly too much serotonin - stop tramadol ER and IR forms.  We will start Morphine IR - take 15mg  every 6 hours as needed. If 15mg  dose not controlling pain, may try 30mg  at a time.  We will refer you to pain management for evaluation.  We will call you for this appointment.  Keep follow up appointment next month.

## 2019-10-09 ENCOUNTER — Ambulatory Visit: Payer: Medicare Other | Admitting: Physical Therapy

## 2019-10-09 DIAGNOSIS — T887XXA Unspecified adverse effect of drug or medicament, initial encounter: Secondary | ICD-10-CM | POA: Insufficient documentation

## 2019-10-09 NOTE — Assessment & Plan Note (Signed)
Anticipate serotonin excess with recent addition of tramadol ER, but doesn't seem to meet criteria for serotonin syndrome. Regardless will recommend stop tramadol ER, will refer to pain management for further assistance. Pt agrees with plan.

## 2019-10-09 NOTE — Assessment & Plan Note (Signed)
Stop tramadol ER, tramadol IR.  Previous pain regimen (prior to 08/2019) was 80 MMED, and when we switched to fentanyl last month we decreased her dose to 60 MMED (+ tramadol IR PRN). Change done because Kadian was discontined (which she did well with).  Now not tolerating tramadol ER (she is on other serotonergic agents).  Will start MSIR 15mg  QID with option to increase to 30mg  if needed. #120 provided today. I asked her to contact me in 1 month.  Other intolerances/ allergies limit pain regimen as far as I know. I have discussed referral to pain management for an evaluation and then pending their recommendation she may follow with me or with them for ongoing chronic pain management. She agrees with plan.

## 2019-10-13 ENCOUNTER — Encounter: Payer: Self-pay | Admitting: Family Medicine

## 2019-10-15 ENCOUNTER — Ambulatory Visit: Payer: Medicare Other | Admitting: Physical Therapy

## 2019-10-15 ENCOUNTER — Telehealth: Payer: Self-pay

## 2019-10-15 NOTE — Telephone Encounter (Signed)
I received a call from [facility]. She states they had received referral for patient, and they are needing reports of patient's most recent x-rays and CT scans of her cervical and lumbar spine.  I have faxed these to her attention at [phone] as requested.

## 2019-10-15 NOTE — Telephone Encounter (Signed)
Actually see latest mychart message - patient has decided to forgo pain clinic evaluation at this time as she's doing well on current regimen for the time being.Rachel Duran

## 2019-10-15 NOTE — Telephone Encounter (Signed)
Noted.  FYI to Dr. G. 

## 2019-10-16 NOTE — Telephone Encounter (Signed)
Called [facility] and Canceled our Referral with Inez Catalina, New Pt Coordinator

## 2019-10-22 ENCOUNTER — Ambulatory Visit: Payer: Medicare Other | Admitting: Orthopaedic Surgery

## 2019-10-22 ENCOUNTER — Other Ambulatory Visit: Payer: Self-pay | Admitting: Family Medicine

## 2019-10-22 ENCOUNTER — Telehealth: Payer: Self-pay | Admitting: Rheumatology

## 2019-10-22 NOTE — Telephone Encounter (Signed)
Patient called requesting prescription refill of Norvasc (90 day supply) to be sent to [facility].

## 2019-10-23 MED ORDER — AMLODIPINE BESYLATE 5 MG PO TABS: 5.0000 mg | ORAL_TABLET | Freq: Every day | ORAL | 0 refills | Status: DC

## 2019-10-23 NOTE — Telephone Encounter (Signed)
Last Visit:09/05/19 Next Visit:03/04/20  Okay to refill per Dr. [name]  

## 2019-10-28 ENCOUNTER — Other Ambulatory Visit: Payer: Self-pay

## 2019-10-28 ENCOUNTER — Other Ambulatory Visit: Payer: Self-pay | Admitting: Family Medicine

## 2019-10-28 ENCOUNTER — Ambulatory Visit (INDEPENDENT_AMBULATORY_CARE_PROVIDER_SITE_OTHER): Payer: Medicare Other | Admitting: Orthopaedic Surgery

## 2019-10-28 ENCOUNTER — Encounter: Payer: Self-pay | Admitting: Orthopaedic Surgery

## 2019-10-28 ENCOUNTER — Ambulatory Visit: Payer: Self-pay

## 2019-10-28 DIAGNOSIS — Z96642 Presence of left artificial hip joint: Secondary | ICD-10-CM | POA: Diagnosis not present

## 2019-10-28 NOTE — Progress Notes (Signed)
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  She says she is doing very well and the hip is doing well.  She is walking faster and longer than what she used to.  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  She is actually starting physical therapy in another week or so for her knees.  There are x-rays on the system that I have looked at of both her knees.  There is no significant findings other than some patellofemoral arthritic changes.  On examination both her knees show excellent and full range of motion.  Her pain is only over the patella tendon of both knees.  Her knees are ligamentously stable and have intact extensor mechanisms.  Examination of her left operative hip shows a smooth rotating hip.  Her leg lengths are equal.  She is walking without a limp or an assistive device.  A low AP pelvis and lateral of the left hip shows a well-seated total hip arthroplasty with no complicating features.  At this point she can follow-up as needed from the hip perspective.  If there is any issues at all she knows to come see [place].  I gave her reassurance that I do feel that physical therapy will help with her patella tendinitis with her knees.

## 2019-10-29 NOTE — Telephone Encounter (Signed)
ERx 

## 2019-10-29 NOTE — Telephone Encounter (Signed)
Name of Medication: Eszopiclone Name of Pharmacy: [facility] or Written Date and Quantity: 06/25/19, #90 Last Office Visit and Type: 10/08/19, chronic pain f/u Next Office Visit and Type: 10/30/19, 4 wk f/u Last Controlled Substance Agreement Date: 04/19/19 Last UDS: 04/19/19

## 2019-10-30 ENCOUNTER — Encounter: Payer: Self-pay | Admitting: Family Medicine

## 2019-10-30 ENCOUNTER — Ambulatory Visit (INDEPENDENT_AMBULATORY_CARE_PROVIDER_SITE_OTHER): Payer: Medicare Other | Admitting: Family Medicine

## 2019-10-30 ENCOUNTER — Other Ambulatory Visit: Payer: Self-pay

## 2019-10-30 VITALS — BP 134/70 | HR 94 | Temp 97.8°F | Ht 64.0 in | Wt 120.6 lb

## 2019-10-30 DIAGNOSIS — G8929 Other chronic pain: Secondary | ICD-10-CM

## 2019-10-30 NOTE — Progress Notes (Signed)
I was running >30 min behind.  Patient opted to reschedule.

## 2019-11-04 ENCOUNTER — Other Ambulatory Visit: Payer: Self-pay

## 2019-11-04 ENCOUNTER — Encounter: Payer: Self-pay | Admitting: Family Medicine

## 2019-11-04 ENCOUNTER — Ambulatory Visit (INDEPENDENT_AMBULATORY_CARE_PROVIDER_SITE_OTHER): Payer: Medicare Other | Admitting: Family Medicine

## 2019-11-04 VITALS — BP 130/82 | HR 97 | Temp 97.6°F | Ht 64.0 in | Wt 121.6 lb

## 2019-11-04 DIAGNOSIS — K5903 Drug induced constipation: Secondary | ICD-10-CM

## 2019-11-04 DIAGNOSIS — G8929 Other chronic pain: Secondary | ICD-10-CM | POA: Diagnosis not present

## 2019-11-04 DIAGNOSIS — G894 Chronic pain syndrome: Secondary | ICD-10-CM | POA: Diagnosis not present

## 2019-11-04 MED ORDER — CYANOCOBALAMIN 1000 MCG/ML IJ SOLN: INTRAMUSCULAR | 3 refills | Status: DC

## 2019-11-04 NOTE — Progress Notes (Signed)
This visit was conducted in person.  BP 130/82 (BP Location: Left Arm, Patient Position: Sitting, Cuff Size: Normal)   Pulse 97   Temp 97.6 F (36.4 C) (Temporal)   Ht 5\' 4"  (1.626 m)   Wt 121 lb 9 oz (55.1 kg)   LMP 07/25/1998   SpO2 98%   BMI 20.87 kg/m    CC: chronic pain f/u Subjective:    Patient ID: Rachel Duran, female    DOB: October 25, 1955, 64 y.o.   MRN: [id number]  HPI: Rachel Duran is a 64 y.o. female presenting on 11/04/2019 for Follow-up (Here for 4 wk f/u.) and Medication Refill (Requests 90-day rx for vit B12. )   See prior notes for details.  Longstanding chronic pain managed with opiate, long acting morphine formulations worked well for her but have slowly been discontinued over the years (3 in the past 5 years - Avinza, Embeda, Kadian). Intolerable side effects to fentanyl and tramadol (recent trials).   #120 tablets of MSIR 15mg  provided on 10/08/2019. Currently on MSIR 15mg  BID to TID for pain control with good effect and overall decreased MMED need per day compared to prior regimen. Normally taking 2 a day - supplementing with aleve PRN and voltaren gel QID. Will refill at #80/mo next refill.   Some constipation managed with miralax, linzess daily, movantik daily as well.   Saw ortho last week after hip replacement surgery 6+ mo ago, released from ortho as was doing very well. Planning to start PT next week for patellar tendonitis.      Relevant past medical, surgical, family and social history reviewed and updated as indicated. Interim medical history since our last visit reviewed. Allergies and medications reviewed and updated. Outpatient Medications Prior to Visit  Medication Sig Dispense Refill  . ALPRAZolam (XANAX) 1 MG tablet Take 0.5 mg by mouth at bedtime.   0  . AMBULATORY NON FORMULARY MEDICATION Medication Name: Gi cocktail 5-10 ml every 4-6 hours as needed for pain. 450 mL 1  . amLODipine (NORVASC) 5 MG tablet Take 1 tablet (5 mg  total) by mouth daily. 90 tablet 0  . amphetamine-dextroamphetamine (ADDERALL XR) 30 MG 24 hr capsule TAKE 1 CAPSULE BY MOUTH EVERY MORNING 90 capsule 0  . ARIPiprazole (ABILIFY) 5 MG tablet Take 5 mg by mouth at bedtime.     . calcium-vitamin D (OSCAL WITH D) 500-200 MG-UNIT tablet Take 1 tablet by mouth 2 (two) times daily.    . cyclobenzaprine (FLEXERIL) 5 MG tablet TAKE ONE TABLET BY MOUTH 3 TIMES DAILY AS NEEDED FOR MUSCLE SPASMS 30 tablet 1  . diclofenac sodium (VOLTAREN) 1 % GEL Apply 2-4 grams to affected joint up to 4 times daily. 400 g 4  . Estradiol (YUVAFEM) 10 MCG TABS vaginal tablet Place 1 tablet (10 mcg total) vaginally 3 (three) times a week. Please keep on file. (Patient taking differently: Place 1 tablet vaginally See admin instructions. Place 1 tablet vaginally Tuesday, Thursday and Saturday.) 12 tablet 10  . Eszopiclone 3 MG TABS TAKE ONE TABLET AT BEDTIME 90 tablet 0  . ferrous sulfate 325 (65 FE) MG tablet Take 325 mg by mouth every Monday, Wednesday, and Friday.     . fluticasone (FLONASE) 50 MCG/ACT nasal spray Place 2 sprays into both nostrils daily as needed (seasonal allergies). (Patient taking differently: Place 1 spray into both nostrils daily as needed for allergies. ) 16 g 5  . lamoTRIgine (LAMICTAL) 100 MG tablet Take 100 mg by mouth  daily.    Marland Kitchen LINZESS 290 MCG CAPS capsule TAKE 1 CAPSULE BY MOUTH DAILY 90 capsule 1  . methocarbamol (ROBAXIN) 500 MG tablet Take 1 tablet (500 mg total) by mouth every 6 (six) hours as needed for muscle spasms. 40 tablet 1  . morphine (MSIR) 15 MG tablet Take 1-2 tablets (15-30 mg total) by mouth every 6 (six) hours as needed for moderate pain. 120 tablet 0  . MOVANTIK 25 MG TABS tablet Take 25 mg by mouth daily.    . NONFORMULARY OR COMPOUNDED ITEM Testosterone propionate 2% in white petrolatum, apply small amount once daily for 5 days per week. (Patient taking differently: Apply 1 application topically See admin instructions.  Testosterone propionate 2% in white petrolatum, Apply small amount topically at bedtime on Tuesday, Thursday and Saturday.) 60 each 2  . ondansetron (ZOFRAN) 4 MG tablet TAKE ONE TABLET EVERY 8 HOURS AS NEEDED FOR NAUSEA OR VOMITING 30 tablet 1  . ondansetron (ZOFRAN-ODT) 4 MG disintegrating tablet TAKE 1 TABLET BY MOUTH EVERY 8 HOURS AS NEEDED FOR NAUSEA OR VOMITING 20 tablet 1  . pantoprazole (PROTONIX) 40 MG tablet Take 1 tablet (40 mg total) by mouth 2 (two) times daily before a meal. 60 tablet 5  . pentosan polysulfate (ELMIRON) 100 MG capsule Take 200 mg by mouth 2 (two) times daily.     . polyethylene glycol (MIRALAX) 17 g packet Take 17 g by mouth 3 (three) times daily.     . pregabalin (LYRICA) 150 MG capsule TAKE 1 CAPSULE BY MOUTH TWICE DAILY 180 capsule 0  . senna (SENOKOT) 8.6 MG tablet Take 2 tablets by mouth daily as needed.     . SF 5000 PLUS 1.1 % CREA dental cream Place 1 application onto teeth at bedtime.     . ursodiol (ACTIGALL) 300 MG capsule TAKE 1 CAPSULE TWICE DAILY 60 capsule 5  . cyanocobalamin (,VITAMIN B-12,) 1000 MCG/ML injection INJECT 1 ML IM EVERY 21 DAYS 1 mL 6   No facility-administered medications prior to visit.     Per HPI unless specifically indicated in ROS section below Review of Systems Objective:    BP 130/82 (BP Location: Left Arm, Patient Position: Sitting, Cuff Size: Normal)   Pulse 97   Temp 97.6 F (36.4 C) (Temporal)   Ht 5\' 4"  (1.626 m)   Wt 121 lb 9 oz (55.1 kg)   LMP 07/25/1998   SpO2 98%   BMI 20.87 kg/m   Wt Readings from Last 3 Encounters:  11/04/19 121 lb 9 oz (55.1 kg)  10/30/19 120 lb 9 oz (54.7 kg)  10/08/19 119 lb 8 oz (54.2 kg)    Physical Exam Vitals and nursing note reviewed.  Constitutional:      Appearance: Normal appearance. She is not ill-appearing.  Eyes:     Extraocular Movements: Extraocular movements intact.     Pupils: Pupils are equal, round, and reactive to light.  Cardiovascular:     Rate and Rhythm:  Normal rate and regular rhythm.     Pulses: Normal pulses.     Heart sounds: Normal heart sounds. No murmur.  Pulmonary:     Effort: Pulmonary effort is normal. No respiratory distress.     Breath sounds: Normal breath sounds. No wheezing, rhonchi or rales.  Musculoskeletal:     Right lower leg: No edema.     Left lower leg: No edema.  Skin:    Findings: No rash.  Neurological:     Mental Status: She  is alert.  Psychiatric:        Mood and Affect: Mood normal.        Behavior: Behavior normal.       Assessment & Plan:  This visit occurred during the SARS-CoV-2 public health emergency.  Safety protocols were in place, including screening questions prior to the visit, additional usage of staff PPE, and extensive cleaning of exam room while observing appropriate contact time as indicated for disinfecting solutions.   Problem List Items Addressed This Visit    Encounter for chronic pain management - Primary    Doing better on current regimen of MSIR. Continue.      Drug-induced constipation    Stable period on miralax, linzess, and movantik daily.       Chronic pain syndrome    Stable period over the past 3 weeks on MSIR - continue current regimen.           Meds ordered this encounter  Medications  . cyanocobalamin (,VITAMIN B-12,) 1000 MCG/ML injection    Sig: INJECT 1 ML IM EVERY 21 DAYS    Dispense:  3 mL    Refill:  3   No orders of the defined types were placed in this encounter.   Patient Instructions  You are doing well today! Continue current medicines.  MSIR seems to be working well.  Return as needed or in 3 months for follow up visit on morphine.    Follow up plan: Return in about 3 months (around 02/03/2020), or if symptoms worsen or fail to improve, for follow up visit.  Ria Bush, MD

## 2019-11-04 NOTE — Assessment & Plan Note (Signed)
Doing better on current regimen of MSIR. Continue.

## 2019-11-04 NOTE — Patient Instructions (Addendum)
You are doing well today! Continue current medicines.  MSIR seems to be working well.  Return as needed or in 3 months for follow up visit on morphine.

## 2019-11-04 NOTE — Assessment & Plan Note (Signed)
Stable period on miralax, linzess, and movantik daily.

## 2019-11-04 NOTE — Assessment & Plan Note (Signed)
Stable period over the past 3 weeks on MSIR - continue current regimen.

## 2019-11-05 ENCOUNTER — Ambulatory Visit: Payer: Medicare Other | Admitting: Physician Assistant

## 2019-11-05 ENCOUNTER — Other Ambulatory Visit: Payer: Self-pay | Admitting: Family Medicine

## 2019-11-05 NOTE — Telephone Encounter (Signed)
Name of Medication: [place] Name of Pharmacy: [facility] or Written Date and Quantity: 08/12/19, #30 Last Office Visit and Type: 11/04/19, chronic pain mngmt Next Office Visit and Type: 02/03/20, chronic pain mngmt Last Controlled Substance Agreement Date: 04/19/19 Last UDS: 04/19/19

## 2019-11-11 ENCOUNTER — Other Ambulatory Visit: Payer: Self-pay | Admitting: Physician Assistant

## 2019-11-11 ENCOUNTER — Other Ambulatory Visit: Payer: Self-pay | Admitting: Obstetrics and Gynecology

## 2019-11-11 DIAGNOSIS — Z1231 Encounter for screening mammogram for malignant neoplasm of breast: Secondary | ICD-10-CM

## 2019-11-13 ENCOUNTER — Encounter: Payer: Self-pay | Admitting: Physical Therapy

## 2019-11-13 ENCOUNTER — Ambulatory Visit: Payer: Medicare Other | Attending: Rheumatology | Admitting: Physical Therapy

## 2019-11-13 ENCOUNTER — Other Ambulatory Visit: Payer: Self-pay

## 2019-11-13 DIAGNOSIS — Z9181 History of falling: Secondary | ICD-10-CM | POA: Insufficient documentation

## 2019-11-13 DIAGNOSIS — M25561 Pain in right knee: Secondary | ICD-10-CM | POA: Insufficient documentation

## 2019-11-13 DIAGNOSIS — G8929 Other chronic pain: Secondary | ICD-10-CM | POA: Diagnosis present

## 2019-11-13 DIAGNOSIS — M25562 Pain in left knee: Secondary | ICD-10-CM | POA: Insufficient documentation

## 2019-11-13 DIAGNOSIS — R262 Difficulty in walking, not elsewhere classified: Secondary | ICD-10-CM | POA: Insufficient documentation

## 2019-11-13 NOTE — Therapy (Signed)
[facility] [address] [place], [address] Phone: [phone]   Fax:  [phone]  Physical Therapy Evaluation  Patient Details  Name: Rachel Duran MRN: [id number] Date of Birth: 12/01/1955 Referring Provider (PT): Bo Merino, MD   Encounter Date: 11/13/2019  PT End of Session - 11/13/19 2039    Visit Number  1    Number of Visits  24    Date for PT Re-Evaluation  02/05/20    Authorization Type  [employer] reporting period from 11/13/2019    Authorization Time Period  n/a    Authorization - Visit Number  1    Authorization - Number of Visits  10    Progress Note Due on Visit  10   Next FOTO due 4/21   PT Start Time  1600    PT Stop Time  1700    PT Time Calculation (min)  60 min    Activity Tolerance  Patient tolerated treatment well    Behavior During Therapy  [facility] for tasks assessed/performed       Past Medical History:  Diagnosis Date  . Abdominal pain last 4 months   and nausea also  . Allergy   . Anemia    history of  . Anxiety   . Bipolar disorder ([place])    atpical bipolar disorder  . 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 C6- C7 -hx. past fusion(bone graft used)  . Cholecystitis   . Chronic pain   . DDD (degenerative disc disease), lumbar 09/2015   dextroscoliosis with multilevel DDD and facet arthrosis most notable for R foraminal disc protrusion L4/5 producing severe R neural foraminal stenosis abutting R L4 nerve root, moderate spinal canal and mild lat recesss and R neural foraminal stenosis L3/4 (MRI)  . Depression    bipolar depression  . Disorders of porphyrin metabolism   . Felon of finger of left hand 11/10/2016  . Fibromyalgia   . GERD (gastroesophageal reflux disease)   . Headache    occasionally  . Internal hemorrhoids   . Interstitial cystitis 06-06-12   hx.  . Irritable bowel syndrome   . PONV (postoperative nausea and vomiting)    now uses  stomach blockers and no ponv  . 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 Raynauds disease    hx.  . Seronegative arthritis    [name]  . 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 ANTERIOR CERVICAL DECOMP/DISCECTOMY FUSION  2004   C5/6, C6/7  . ANTERIOR CERVICAL DECOMP/DISCECTOMY FUSION  02/2016   C3/4, C4/5 with plating [name])  . AUGMENTATION MAMMAPLASTY Bilateral 03/25/2010  . BREAST ENHANCEMENT SURGERY  2010  . BREAST IMPLANT EXCHANGE  10/2014   exchange saline implants, B mastopexy/capsulorraphy ([facility])  . BUNIONECTOMY Bilateral yrs ago  . [place]   x 1  . CHOLECYSTECTOMY  06/11/2012   Procedure: LAPAROSCOPIC CHOLECYSTECTOMY WITH INTRAOPERATIVE CHOLANGIOGRAM;  Surgeon: [name], MD,FACS;  Location: [facility];  Service: General;  Laterality: N/A;  . COLONOSCOPY  02/2018   done for positive cologuard - WNL, rpt 10 yrs Fuller Plan)  . CYSTOSCOPY    . ERCP  05/22/2012  Procedure: ENDOSCOPIC RETROGRADE CHOLANGIOPANCREATOGRAPHY (ERCP);  Surgeon: [name], MD,FACG;  Location: [facility];  Service: Endoscopy;  Laterality: N/A;  . ERCP N/A 09/17/2013   Procedure: ENDOSCOPIC RETROGRADE CHOLANGIOPANCREATOGRAPHY (ERCP);  Surgeon: [name], MD;  Location: [facility];  Service: Endoscopy;  Laterality: N/A;  . ERCP N/A 09/27/2018   Procedure: ENDOSCOPIC RETROGRADE CHOLANGIOPANCREATOGRAPHY (ERCP);  Surgeon: [name], MD;  Location: [facility];  Service: Endoscopy;  Laterality: N/A;  . ESOPHAGOGASTRODUODENOSCOPY  09/2016   WNL. esophagus dilated Fuller Plan)  . HEMORRHOID BANDING  09-23-13   --Dr. Greer Pickerel  . HERNIA REPAIR     inguinal  . HYSTEROSCOPY W/ ENDOMETRIAL ABLATION    . NASAL SINUS SURGERY      x5  . REMOVAL OF STONES  09/27/2018   Procedure: REMOVAL OF STONES;  Surgeon: [name], MD;  Location: [facility];  Service: Endoscopy;;  . [name]  09/27/2018   Procedure: SPHINCTEROTOMY;  Surgeon: [name], MD;  Location: [facility];  Service: Endoscopy;;  . TOTAL HIP ARTHROPLASTY Left 03/12/2019   Procedure: LEFT TOTAL HIP ARTHROPLASTY ANTERIOR APPROACH;  [name], [name], MD)  . UPPER GASTROINTESTINAL ENDOSCOPY      There were no vitals filed for this visit.   Subjective Assessment - 11/13/19 1620    Subjective  Patient reports her knees have hurt about 15 years and has had OA for that time. A lot of the pain is over the anterior knees. She has tried to do stretches and exercises, but if she goes to quick it makes her knees really hurt. Has used heat, voltaren gel, cortisone shots, Halogen (did nothing) over the years. In January 2021 she suddenly started having severe pain over the patellae. She feels they are both equally as bad, maybe slightly more on R than left. Tried ice and voltaren gel, heat. Knees have stayed the same since the pain started. Rates it as moderate to severe and really affects her quality of life. States she has had sciatica down R leg (lateral thigh) when she fell last year in May. It goes down the leg to the knee. Only time she can get relief is sit down and bend knee. Doesn't come and go. Sitting is much worse. Had not had leg pain prior to that fall. Currently has disc problems in lumbar region and thoracic region and C6-7. History of two discectomies in the cervical spine. No lumbar or thoracic surgery (but considering another surgery in the lower cervical and upper thoracic). Fusion in the neck but not the lumbar spine. She tends to get very nauseated when she does exercise that involves moving the neck. Gets dizzy easily. States poor activity tolerance when she starts trying to complete an exercise program that includes resistance bands.  She has had three falls in the last two years. The last one was about 2 weeks ago. She was coming down wooden stairs from 2nd floor with socks on and missed a step. Hit right side and still has big bruises on the R side. Still having some pain from it. Felt like she was uninjured and has not gotten it evaluated yet (husband is a paramedic). Took a fall in March 2020 and May 2020. March she was walking her two dogs (small and medium herding dogs), mail truck scared them and they pulled her down to her knees. May was playing with dogs and stepped backwards on to hardwood from carpet. Landed on L hip and lead to L THA (August 2020).  Does have scoliosis that she feels affects her balance.    Pertinent History  Patient is a 64 y.o. female who presents to outpatient physical therapy with a referral for medical diagnosis primary osteoarthritis of both knees, chronic pain of both knees, chondromalacia patella - bilateral knees. This patient's chief complaints consist of knee pain, weakness, poor activity tolerance leading to the following functional deficits: difficulty with ADLs, IADLs, basic mobility and usual activities including difficulty walking dogs, gardening, getting up and down to knees and up, standing in place long enough to check out at grocery store, limited activity tolerance, stairs, walking. Relevant past medical history and comorbidities include raynaud, lumbar and cervical spine pain with disectomies and fusions in the cervical spine (C3-C7 bone graft used), symptomatic with nausea and dizzines when moves c-spine too much, chronic fatigue syndrome (3-4 good hours a day), interstitial cystitis, DDD, scoliosis, bursitis R hip, L THA (august 2020), headaches (1x a week migraines), insomnia, fibromyalgia, reflux, bile duct stones, cholecystectomy, chronic pain, pain medication induced GI problem (managed with medication), atypical biploar disorder (fairly well managed), anxiety, osteopenia, Stargardt's  disease, seronegative arhtritis. Patient denies hx of cancer, stroke, seizures, lung problem, major cardiac events, diabetes, unexplained weight loss, changes in bowel or bladder problems, saddle paresthesia, new onset stumbling or dropping things.    Limitations  Standing;Walking;House hold activities;Other (comment)   difficulty with ADLs, IADLs, basic mobility and usual activities including difficulty walking dogs, gardening, getting up and down to knees and up, standing in place long enough to check out at grocery store, limited activity tolerance, stairs, walking.   How long can you sit comfortably?  other things bothers her in sitting, knees no worse    How long can you stand comfortably?  10 min (not comfortable)    How long can you walk comfortably?  25 min before having to stop (not comfortable).    Diagnostic tests  radiograph of bilateral knees report 09/05/2019: "Mild to moderate medial compartment narrowing was noted.  No chondrocalcinosis was noted.  Severe patellofemoral narrowing was noted. Impression: These findings are consistent with mild to moderate osteoarthritis and severe chondromalacia patella."    Patient Stated Goals  wants to get stronger, feels LE very weak, get a good exercise program, how to manage exercise, times of day and what types of exercise, and make knee pain go away.    Currently in Pain?  Yes    Pain Score  7    W: 8/10; B: 5/10   Pain Location  Knee    Pain Orientation  Right;Left;Anterior   over patellar tendon, patella (inferior portion), and anterior joint line, mild pain at distal quadriceps, posterior knee.   Pain Descriptors / Indicators  Other (Comment)   "not achy, throbbing, pressure pain" not electric or paresthesia; does not feel like sciatica does or usual arthritis does   Pain Type  Chronic pain    Pain Radiating Towards  does have R sided sciatica that she has had since fall that affected hip (present prior to knee pain), Knee pain occasionally  radiates to posterior knee.    Pain Onset  More than a month ago    Pain Frequency  Constant    Aggravating Factors   too much movement causes problems in a lot of joints, weight bearing, walking, squatting, kneeling, stairs,    Pain Relieving Factors  voltaren gel help a bit, heat feels good, sitting and elevating knees, less activity, rest.    Effect of Pain on  Daily Activities  difficulty with ADLs, IADLs, basic mobility and usual activities including difficulty walking dogs, gardening, getting up and down to knees and up, standing in place long enough to check out at grocery store, limited activity tolerance, stairs, walking.         [facility] PT Assessment - 11/13/19 0001      Assessment   Medical Diagnosis  primary osteoarthritis of both knees, chronic pain of both knees, Chondromalacia patella - bilateral knee joints    Referring Provider (PT)  Bo Merino, MD    Onset Date/Surgical Date  07/26/19   has had OA for a long time, but new pain started in January   [place]  Right    Next MD Visit  fall    Prior Therapy  none for current problem prior to this episode of care      Precautions   Precautions  Fall      Restrictions   Weight Bearing Restrictions  No      Balance Screen   Has the patient fallen in the past 6 months  Yes    How many times?  1    Has the patient had a decrease in activity level because of a fear of falling?   No    Is the patient reluctant to leave their home because of a fear of falling?   No      Home Film/video editor residence    Living Arrangements  Spouse/significant other;Other (Comment)   two dogs   Type of Home  House    Home Access  Stairs to enter    Entrance Stairs-Number of Steps  1    Entrance Stairs-Rails  None    Home Layout  Two level   bonus room, bathroom   Alternate Level Stairs-Number of Steps  14    Alternate Level Stairs-Rails  Left    Home Equipment  [place] - single point;Walker - 2 wheels     Additional Comments  feels safe with showers      Prior Function   Level of Independence  Needs assistance with homemaking;Independent with basic ADLs;Other (comment);Independent with household mobility without device;Independent with community mobility without device;Independent with gait;Independent with transfers   needs help with buttons sometimes, I with dressing, driving   Vocation  On disability    Vocation Requirements  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 No gross genuvalgum, genuvarus, or recurvatum observed. . Tremor: none . Muscle bulk: generally overall mildly decreased consistent with lack of regular adequate exercise or physical activity.  . Transfers: sit <> stand I with pain . Gait: grossly WFL for household and short community ambulation. More detailed gait analysis deferred to later date as needed. Painful . Stairs: deferred due to pt report of unable to perform at this time due to high irritability of symptoms.    NEUROLOGICAL  Upper Motor Neuron Screen Hoffman's, and Clonus (ankle) negative bilaterally Dermatomes.  . L2-S2 appears equal and intact to light touch. Myotomes . L2-S2 appears intact except R S1 (eversion) Deep Tendon Reflexes R/L  .  2+/0+ Quadriceps reflex (L4) . 2+/2+ Achilles reflex (S1)  SPINE MOTION  Lumbar AROM - Deferred due to high level of irritability of lumbar condition.    PERIPHERAL JOINT MOTION (in degrees)  Active Range of Motion (AROM) Comments: B LE grossly WFL for basic mobility. Further assessment deferred to later session as needed.   Passive Range of Motion (PROM) Comments: B BL  grossly WFL for basic mobility. Further assessment deferred to later session as needed.   MUSCLE PERFORMANCE (MMT):  *Indicates pain 11/13/2019 Date Date  Joint/Motion R/L R/L R/L  Hip     Flexion 4*/4* / /  Extension (standing) 4*/4* / /  Abduction (standing) 4*/3+* / /  Adduction / / /  External rotation / / /  Internal rotation  / / /  Knee     Extension 5*/5* / /  Flexion 4*/4* / /  Ankle/Foot     Dorsiflexion  4+/4+ / /  Great toe extension 4/4 / /  Eversion 3/4 / /  Comments: able to toe and heel walk with minimal BUE support. All motions painful. Hip extension and abduction completed in standing due to poor tolerance for rolling and supine.   ACCESSORY MOTION:  - B tibiofemoral joints painful and hypomobile to accessory movements.  - B patella painful hypomobile to accessory motions all directions  PALPATION: - Severely TTP at B patellar tendons, less so at B anterior joint line, less so at posterior knee and suprapatellar region.   FUNCTIONAL/BALANCE TESTS: Deferred to later session  EDUCATION/COGNITION: Patient is alert and oriented X 4.  Objective measurements completed on examination: See above findings.    TREATMENT:  Significant comorbidities - see subjective   Therapeutic exercise: to centralize symptoms and improve ROM, strength, muscular endurance, and activity tolerance required for successful completion of functional activities.  - seated isometric knee extension/flexion using contralateral LEs against each other. 4 second holds x 10 each side.  - Education on diagnosis, prognosis, POC, anatomy and physiology of current condition.  - Education on HEP including handout   HOME EXERCISE PROGRAM Access Code: [email or web address] URL: [email or web address] Date: 11/13/2019 Prepared by: Rosita Kea  Exercises Seated Isometric Knee Extension - 1-2 x daily - 1 sets - 10 reps - 4 seconds hold   Patient response to treatment:  Pt tolerated treatment  well. Pt was able to complete all exercises with minimal to no lasting increase in pain or discomfort. Pt required multimodal cuing for proper technique and to facilitate improved neuromuscular control, strength, range of motion, and functional ability resulting in improved performance and form.     PT Education - 11/13/19 2013    Education Details  Exercise purpose/form. Self management techniques. Education on diagnosis, prognosis, POC, anatomy and physiology of current condition Education on HEP including handout    Person(s) Educated  Patient    Methods  Explanation;Demonstration;Tactile cues;Handout;Verbal cues    Comprehension  Verbal cues required;Tactile cues required;Returned demonstration;Verbalized understanding;Need further instruction       PT Short Term Goals - 11/13/19 2041      PT SHORT TERM GOAL #1   Title  Be independent with initial home exercise program for self-management of symptoms.    Baseline  initial HEP provided at IE (11/13/2019);    Time  2    Period  Weeks    Status  New    Target Date  11/27/19        PT Long Term Goals - 11/13/19 2041  PT LONG TERM GOAL #1   Title  Be independent with a long-term home exercise program for self-management of symptoms.    Baseline  initial HEP provided at IE (11/13/2019);    Time  12    Period  Weeks    Status  New    Target Date  02/05/20      PT LONG TERM GOAL #2   Title  Demonstrate improved FOTO score by 10 units to demonstrate improvement in overall condition and self-reported functional ability.    Baseline  deferred to visit 2 (11/13/2019);    Time  12    Period  Weeks    Status  New    Target Date  02/05/20      PT LONG TERM GOAL #3   Title  Improve BLE  strength to 4+/5 for improved ability to allow patient to complete valued functional tasks such as gardening, and walking dogs with less difficulty.    Baseline  see objective exam (11/13/2019);    Time  12    Period  Weeks    Status  New     Target Date  02/05/20      PT LONG TERM GOAL #4   Title  Complete community, work and/or recreational activities without limitation due to current condition.    Baseline  difficulty with ADLs, IADLs, basic mobility and usual activities including difficulty walking dogs, gardening, getting up and down to knees and up, standing in place long enough to check out at grocery store, limited activity tolerance, stairs, walking.    Time  12    Period  Weeks    Status  New    Target Date  02/05/20      PT LONG TERM GOAL #5   Title  Reduce pain with functional activities to equal or less than 3/10 to allow patient to complete usual activities including ADLs, IADLs, and social engagement with less difficulty.    Baseline  up to 8/10 (11/13/2019);    Time  12    Period  Weeks    Status  New    Target Date  02/05/20             Plan - 11/13/19 2019    Clinical Impression Statement  Patient is a 64 y.o. female referred to outpatient physical therapy with a medical diagnosis of primary osteoarthritis of both knees, chronic pain of both knees, chondromalacia patella in bilateral knees who presents with signs and symptoms consistent with B anterior knee pain and hypersensitivity at the bilateral patellae and patellar tendons in the setting of chronic pain conditions, arthritis, and evidence of severe chondromalacia patellae in B knees. Patient presents with significant pain, activity tolerance, paresthesia, postural abnormalities, stiffness, muscle performance (strength/endurance/power) impairments that are limiting ability to complete her  ADLs, IADLs, basic mobility and usual activities including walking d ogs, gardening, getting up and down to knees and up, standing in place long enough to check out at grocery store, stairs, walking without difficulty. Patient has many comorbid conditions that have the potential to severely interfere with her progress, but have the potential for accomodation based on her  response to PT interventions in the next few weeks. She did respond well to PT of her recent L THA. Patient will benefit from skilled physical therapy intervention to address current body structure impairments and activity limitations to improve function and work towards goals set in current POC in order to return to prior level of function or  maximal functional improvement.    Personal Factors and Comorbidities  Age;Time since onset of injury/illness/exacerbation;Past/Current Experience;Comorbidity 3+;Other   chronicity and severity of multiple comorbid conditions   Comorbidities  Relevant past medical history and comorbidities include raynaud, lumbar and cervical spine pain with disectomies and fusions in the cervical spine (C3-C7 bone graft used), symptomatic with nausea and dizzines when moves c-spine too much, chronic fatigue syndrome (3-4 good hours a day), interstitial cystitis, DDD, scoliosis, bursitis R hip, L THA (august 2020), headaches (1x a week migraines), insomnia, fibromyalgia, reflux, bile duct stones, cholecystectomy, chronic pain, pain medication induced GI problem (managed with medication), atypical biploar disorder (fairly well managed), anxiety, osteopenia, Stargardt's disease, seronegative arthritis.    Examination-Activity Limitations  Bathing;Bend;Caring for Others;Stand;Stairs;Squat;Dressing;Transfers   difficulty with ADLs, IADLs, basic mobility and usual activities including difficulty walking dogs, gardening, getting up and down to knees and up, standing in place long enough to check out at grocery store, limited activity tolerance, stairs, walking.   Examination-Participation Restrictions  Laundry;Shop;Cleaning;Community Activity;Yard Work;Meal Prep;Other    Stability/Clinical Decision Making  Evolving/Moderate complexity    Clinical Decision Making  Moderate    Rehab Potential  Fair    PT Frequency  2x / week    PT Duration  12 weeks    PT Treatment/Interventions  ADLs/Self  Care Home Management;Aquatic Therapy;Biofeedback;Cryotherapy;Electrical Stimulation;Moist Heat;Functional mobility training;Therapeutic activities;Therapeutic exercise;Balance training;Neuromuscular re-education;Patient/family education;Manual techniques;Passive range of motion;Dry needling;Energy conservation;Joint Manipulations    PT Next Visit Plan  FOTO! Assess response to HEP, continue examination of balance and ROM, progressive LE strengthening as tolerated    PT Home Exercise Plan  Medbridge Access Code: [email or web address]    Consulted and Agree with Plan of Care  Patient       Patient will benefit from skilled therapeutic intervention in order to improve the following deficits and impairments:  Dizziness, Pain, Postural dysfunction, Decreased mobility, Decreased activity tolerance, Decreased endurance, Decreased range of motion, Decreased strength, Impaired perceived functional ability, Impaired flexibility, Difficulty walking, Decreased balance, Hypomobility, Increased muscle spasms  Visit Diagnosis: Chronic pain of right knee  Chronic pain of left knee  Difficulty in walking, not elsewhere classified  History of falling     Problem List Patient Active Problem List   Diagnosis Date Noted  . Sore throat 10/02/2019  . Positive ANA (antinuclear antibody) 06/05/2019  . 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  . Unilateral primary osteoarthritis, left hip 01/28/2019  . Pelvic pain 12/12/2018  . Common bile duct (CBD) obstruction   . Elevated alkaline phosphatase level   . Venous insufficiency of left lower extremity 07/04/2018  . Dyslipidemia 09/22/2017  . DDD (degenerative disc disease), cervical 04/03/2017  . DDD (degenerative disc disease), lumbar 04/03/2017  . Onychomycosis 03/21/2017  . Dysphagia 10/18/2016  . Encounter for chronic pain management 10/18/2016  . Biliary stasis 09/26/2016  . Fever blister 08/18/2016  . Urinary urgency 07/05/2016  . HNP (herniated  nucleus pulposus), lumbar 09/23/2015  . Sinus congestion 07/30/2015  . Iron deficiency 07/30/2015  . Health maintenance examination 06/15/2015  . 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 Headache 03/09/2015  . Clavicle enlargement 12/13/2014  . Prediabetes 12/13/2014  . Plantar fasciitis, bilateral 09/11/2014  . Advanced care planning/counseling discussion 06/10/2014  . Medicare annual wellness visit, subsequent 06/10/2014  . Abnormal thyroid function test 06/10/2014  . Chronic pain syndrome 06/10/2014  . CFS (chronic fatigue syndrome) 04/08/2014  . Postmenopausal atrophic vaginitis 10/19/2012  . 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 Cervical disc disorder  with radiculopathy of cervical region 05/28/2010  . CHEST PAIN UNSPECIFIED 02/28/2008  . APHTHOUS ULCERS 01/31/2008  . Chronic insomnia 11/09/2007  . Drug-induced constipation 10/11/2007  . Allergic rhinitis 04/12/2007  . MDD (major depressive disorder), recurrent episode, moderate ([place]) 03/05/2007  . [name] syndrome 03/05/2007  . GERD 03/05/2007  . ROSACEA 03/05/2007  . NEURALGIA 03/05/2007  . 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 Chronic interstitial cystitis 12/06/2006  . Fibromyalgia 12/06/2006    Everlean Alstrom. Graylon Good, PT, DPT 11/13/19, 8:46 PM  [facility] [address] [place], [address] Phone: [phone]   Fax:  [phone]  Name: Rachel Duran MRN: [id number] Date of Birth: [birth year]

## 2019-11-14 ENCOUNTER — Other Ambulatory Visit: Payer: Self-pay | Admitting: Obstetrics and Gynecology

## 2019-11-14 NOTE — Telephone Encounter (Signed)
Patient is asking to talk with a nurse about the "ointment refill request that was denied." She said "Dr.Silva has me use this medication all year and I am not due to see her until July".

## 2019-11-14 NOTE — Telephone Encounter (Signed)
Med refill request:Testosterone propionate 2% ointment  Last AEX: 01/2019 Next AEX: 02/06/2020 with BS  Last MMG (if hormonal med) 6/120 Bir[place] 1, negative, next scheduled 12/25/19 Refill authorized: Please Advise? Pended Rx # 60, 0RF   Last Testosterone total 12.4, Testosterone free <0.2 in 01/31/2019  Last refill 10/10/2018, 60 each, 2 RF.    Spoke with pt. Pt states had call from [facility] saying Rx was denied. Pt states does not know why. Pt is completely out. Pt said Rx tubes had expired 10/01/19.   Does pt need testosterone levels drawn? Can wait til AEX in 7/21?  Will review with Dr Quincy Simmonds for recommendations and refill request.   Routing to Dr Quincy Simmonds.

## 2019-11-15 MED ORDER — NONFORMULARY OR COMPOUNDED ITEM: 0 refills | Status: DC

## 2019-11-15 NOTE — Telephone Encounter (Signed)
Spoke with patient, advised as seen below per Dr. Quincy Simmonds. Patient reports she is using medication 5 days a week. Advised patient I will forward update to Dr. Quincy Simmonds, will fax signed Rx on 4/26 when she returns to the office. Patient verbalizes understanding and is agreeable.   Rx printed and placed on Dr. Elza Rafter desk.    Routing to Dr. Quincy Simmonds

## 2019-11-15 NOTE — Telephone Encounter (Signed)
Please let patient know that I am happy to refill her testosterone.   Please remind her that this is a controlled medication, and that the prescription needs to be physically re-written periodically due to prescribing regulations.    Please confirm if she is using 5 days a week or if she is doing 3 days a week.  Her prescription will need to be updated in Epi if she is using 3 days a week. I can then sign the Rx.  I will look forward to seeing her for her annual exam!

## 2019-11-18 NOTE — Telephone Encounter (Signed)
Rx faxed to [facility].   Encounter closed.

## 2019-11-19 ENCOUNTER — Other Ambulatory Visit: Payer: Self-pay

## 2019-11-19 ENCOUNTER — Encounter: Payer: Self-pay | Admitting: Physical Therapy

## 2019-11-19 ENCOUNTER — Ambulatory Visit: Payer: Medicare Other | Admitting: Physical Therapy

## 2019-11-19 DIAGNOSIS — M25561 Pain in right knee: Secondary | ICD-10-CM | POA: Diagnosis not present

## 2019-11-19 DIAGNOSIS — R262 Difficulty in walking, not elsewhere classified: Secondary | ICD-10-CM

## 2019-11-19 DIAGNOSIS — G8929 Other chronic pain: Secondary | ICD-10-CM

## 2019-11-19 DIAGNOSIS — Z9181 History of falling: Secondary | ICD-10-CM

## 2019-11-19 NOTE — Therapy (Signed)
[facility] [address] [place], [address] Phone: [phone]   Fax:  [phone]  Physical Therapy Treatment  Patient Details  Name: Rachel Duran MRN: [id number] Date of Birth: 1955/11/17 Referring Provider (PT): Bo Merino, MD   Encounter Date: 11/19/2019  PT End of Session - 11/19/19 1537    Visit Number  2    Number of Visits  24    Date for PT Re-Evaluation  02/05/20    Authorization Type  [employer] reporting period from 11/13/2019    Authorization Time Period  n/a    Authorization - Visit Number  2    Authorization - Number of Visits  10    Progress Note Due on Visit  10   Next FOTO due 4/21   PT Start Time  1431    PT Stop Time  1515    PT Time Calculation (min)  44 min    Activity Tolerance  Patient tolerated treatment well    Behavior During Therapy  [facility] for tasks assessed/performed       Past Medical History:  Diagnosis Date  . Abdominal pain last 4 months   and nausea also  . Allergy   . Anemia    history of  . Anxiety   . Bipolar disorder ([place])    atpical bipolar disorder  . 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 C6- C7 -hx. past fusion(bone graft used)  . Cholecystitis   . Chronic pain   . DDD (degenerative disc disease), lumbar 09/2015   dextroscoliosis with multilevel DDD and facet arthrosis most notable for R foraminal disc protrusion L4/5 producing severe R neural foraminal stenosis abutting R L4 nerve root, moderate spinal canal and mild lat recesss and R neural foraminal stenosis L3/4 (MRI)  . Depression    bipolar depression  . Disorders of porphyrin metabolism   . Felon of finger of left hand 11/10/2016  . Fibromyalgia   . GERD (gastroesophageal reflux disease)   . Headache    occasionally  . Internal hemorrhoids   . Interstitial cystitis 06-06-12   hx.  . Irritable bowel syndrome   . PONV (postoperative nausea and vomiting)    now uses  stomach blockers and no ponv  . 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   Evaluated in Pulmonary clinic/ [facility]/ [place] /  02/07/12 > referred to Health Dept 02/10/2012     - POS GOLD    01/31/2012    . Raynauds disease    hx.  . Seronegative arthritis    [name]  . 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 ANTERIOR CERVICAL DECOMP/DISCECTOMY FUSION  2004   C5/6, C6/7  . ANTERIOR CERVICAL DECOMP/DISCECTOMY FUSION  02/2016   C3/4, C4/5 with plating [name])  . AUGMENTATION MAMMAPLASTY Bilateral 03/25/2010  . BREAST ENHANCEMENT SURGERY  2010  . BREAST IMPLANT EXCHANGE  10/2014   exchange saline implants, B mastopexy/capsulorraphy ([facility])  . BUNIONECTOMY Bilateral yrs ago  . [place]   x 1  . CHOLECYSTECTOMY  06/11/2012   Procedure: LAPAROSCOPIC CHOLECYSTECTOMY WITH INTRAOPERATIVE CHOLANGIOGRAM;  Surgeon: [name], MD,FACS;  Location: [facility];  Service: General;  Laterality: N/A;  . COLONOSCOPY  02/2018   done for positive cologuard - WNL, rpt 10 yrs Fuller Plan)  . CYSTOSCOPY    . ERCP  05/22/2012  Procedure: ENDOSCOPIC RETROGRADE CHOLANGIOPANCREATOGRAPHY (ERCP);  Surgeon: [name], MD,FACG;  Location: [facility];  Service: Endoscopy;  Laterality: N/A;  . ERCP N/A 09/17/2013   Procedure: ENDOSCOPIC RETROGRADE CHOLANGIOPANCREATOGRAPHY (ERCP);  Surgeon: [name], MD;  Location: [facility];  Service: Endoscopy;  Laterality: N/A;  . ERCP N/A 09/27/2018   Procedure: ENDOSCOPIC RETROGRADE CHOLANGIOPANCREATOGRAPHY (ERCP);  Surgeon: [name], MD;  Location: [facility];  Service: Endoscopy;  Laterality: N/A;  . ESOPHAGOGASTRODUODENOSCOPY  09/2016   WNL. esophagus dilated [facility] Plan)  . HEMORRHOID BANDING  09-23-13   --Dr. Greer Pickerel  . HERNIA REPAIR     inguinal  . HYSTEROSCOPY W/ ENDOMETRIAL ABLATION    . NASAL SINUS SURGERY      x5  . REMOVAL OF STONES  09/27/2018   Procedure: REMOVAL OF STONES;  Surgeon: [name], MD;  Location: [facility];  Service: Endoscopy;;  . [name]  09/27/2018   Procedure: SPHINCTEROTOMY;  Surgeon: [name], MD;  Location: [facility];  Service: Endoscopy;;  . TOTAL HIP ARTHROPLASTY Left 03/12/2019   Procedure: LEFT TOTAL HIP ARTHROPLASTY ANTERIOR APPROACH;  [name], [name], MD)  . UPPER GASTROINTESTINAL ENDOSCOPY      There were no vitals filed for this visit.  Subjective Assessment - 11/19/19 1442    Subjective  Patient reports she is having a relatively good day today and rates her knee pain as more soreness than pain. she was able to walk her dog today. She has been able to do her HEP without overall increase in pain. rates pain 6/10 today at bilateral anterior knees. Reports she has had some soft tissue interventions in the past and usually feel good unless too long, too deep, or too much and that makes her dizzy and sick to her stomach.    Pertinent History  Patient is a 64 y.o. female who presents to outpatient physical therapy with a referral for medical diagnosis primary osteoarthritis of both knees, chronic pain of both knees, chondromalacia patella - bilateral knees. This patient's chief complaints consist of knee pain, weakness, poor activity tolerance leading to the following functional deficits: difficulty with ADLs, IADLs, basic mobility and usual activities including difficulty walking dogs, gardening, getting up and down to knees and up, standing in place long enough to check out at grocery store, limited activity tolerance, stairs, walking. Relevant past medical history and comorbidities include raynaud, lumbar and cervical spine pain with disectomies and fusions in the cervical spine (C3-C7 bone graft used), symptomatic with nausea and dizzines when moves c-spine too much, chronic fatigue syndrome (3-4 good hours a day), interstitial cystitis, DDD,  scoliosis, bursitis R hip, L THA (august 2020), headaches (1x a week migraines), insomnia, fibromyalgia, reflux, bile duct stones, cholecystectomy, chronic pain, pain medication induced GI problem (managed with medication), atypical biploar disorder (fairly well managed), anxiety, osteopenia, Stargardt's disease, seronegative arhtritis. Patient denies hx of cancer, stroke, seizures, lung problem, major cardiac events, diabetes, unexplained weight loss, changes in bowel or bladder problems, saddle paresthesia, new onset stumbling or dropping things.    Limitations  Standing;Walking;House hold activities;Other (comment)   difficulty with ADLs, IADLs, basic mobility and usual activities including difficulty walking dogs, gardening, getting up and down to knees and up, standing in place long enough to check out at grocery store, limited activity tolerance, stairs, walking.   How long can you sit comfortably?  other things bothers her in sitting, knees no worse    How long can you stand comfortably?  10 min (  not comfortable)    How long can you walk comfortably?  25 min before having to stop (not comfortable).    Diagnostic tests  radiograph of bilateral knees report 09/05/2019: "Mild to moderate medial compartment narrowing was noted.  No chondrocalcinosis was noted.  Severe patellofemoral narrowing was noted. Impression: These findings are consistent with mild to moderate osteoarthritis and severe chondromalacia patella."    Patient Stated Goals  wants to get stronger, feels LE very weak, get a good exercise program, how to manage exercise, times of day and what types of exercise, and make knee pain go away.    Currently in Pain?  Yes    Pain Score  6     Pain Location  Knee    Pain Orientation  Right;Left;Anterior    Pain Onset  More than a month ago         [facility] PT Assessment - 11/19/19 0001      Assessment   Medical Diagnosis  primary osteoarthritis of both knees, chronic pain of both knees,  Chondromalacia patella - bilateral knee joints    Referring Provider (PT)  Bo Merino, MD    Onset Date/Surgical Date  07/26/19   has had OA for a long time, but new pain started in January   [place]  Right    Next MD Visit  fall    Prior Therapy  none for current problem prior to this episode of care      Precautions   Precautions  Fall      Restrictions   Weight Bearing Restrictions  No      Balance Screen   Has the patient fallen in the past 6 months  Yes    How many times?  1    Has the patient had a decrease in activity level because of a fear of falling?   No    Is the patient reluctant to leave their home because of a fear of falling?   No      Home Film/video editor residence    Living Arrangements  Spouse/significant other;Other (Comment)   two dogs   Type of Home  House    Home Access  Stairs to enter    Entrance Stairs-Number of Steps  1    Entrance Stairs-Rails  None    Home Layout  Two level   bonus room, bathroom   Alternate Level Stairs-Number of Steps  14    Alternate Level Stairs-Rails  Left    Home Equipment  Blair - single point;Walker - 2 wheels    Additional Comments  feels safe with showers      Prior Function   Level of Independence  Needs assistance with homemaking;Independent with basic ADLs;Other (comment);Independent with household mobility without device;Independent with community mobility without device;Independent with gait;Independent with transfers   needs help with buttons sometimes, I with dressing, driving   Vocation  On disability    Vocation Requirements  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    Change in Gait Speed  Able to smoothly change walking speed without loss of balance or gait deviation. Deviate no more than 6 in outside of the 12 in walkway width.    Gait with Horizontal Head Turns  Performs head turns smoothly with no change in gait. Deviates no more than 6 in outside 12 in walkway width    Gait with Vertical Head Turns  Performs head turns with no change in gait. Deviates no more than 6 in outside 12 in walkway width.    Gait and Pivot Turn  Pivot turns safely within 3 sec and stops quickly with no loss of balance.    Step Over Obstacle  Is able to step over 2 stacked shoe boxes taped together (9 in total height) without changing gait speed. No evidence of imbalance.    Gait with Narrow Base of Support  Is able to ambulate for 10 steps heel to toe with no staggering.    Gait with Eyes Closed  Walks 20 ft, uses assistive device, slower speed, mild gait deviations, deviates 6-10 in outside 12 in walkway width. Ambulates 20 ft in less than 9 sec but greater than 7 sec.    Ambulating Backwards  Walks 20 ft, uses assistive device, slower speed, mild gait deviations, deviates 6-10 in outside 12 in walkway width.    Steps  Alternating feet, must use rail.    Total Score  27    FGA comment:  25-28 = low risk fall       OBJECTIVE FOTO = 40 (11/19/2019);   Functional Gait Assessment (FGA): 27/30 (low fall risk)  Single Leg Stance Balance:   L = 23 seconds  R = > 30 seconds  TREATMENT:  Significant comorbidities - see subjective   Therapeutic exercise:to centralize symptoms and improve ROM, strength, muscular endurance, and activity tolerance required for successful completion of functional activities.  - time  to fill out FOTO (6 min unbilled) - balance assessment (see FGA and single leg stance above). Pt scored low fall risk.  - review of HEP: seated isometric knee extension/flexion using contralateral LEs against each other. 4 second holds x 3 each side. Reviewed multiple angles as an option to add at home.  - seated AROM long arc quad with 3secH, x 10 each side - Hooklying straight leg raise with quad set, x10 each side - education on results of balance testing and strategies to reduce fall risk in situations that have lead to falls at home (dogs pulling her, slipping on wood stairs, slipping on wood floor when stepping off carpet). Went over how to hold a dog leash in a way that is secure but can released easily, dog training (pt's dogs have good recall), wearing grippy socks or shoes in the house, carpet on wooden stairs, looking for trip hazards and moving them.  - educated on appropriate response for good tolerance to HEP and when to cut back if needed.   HOME EXERCISE PROGRAM Access Code: [email or web address] URL: [email or web address] Date: 11/19/2019 Prepared by: Rosita Kea  Exercises Seated Isometric Knee Extension - 1-2 x daily - 1 sets - 10 reps - 4 seconds hold Seated Long Arc Quad - 1-2 x daily - 1 sets - 10 reps - 4 seconds hold Supine  Active Straight Leg Raise - 1-2 x daily - 1 sets - 10 reps - 2 seconds hold   PT Education - 11/19/19 1547    Education Details  Exercise purpose/form. Self management techniques. HEP. Fall prevention.    Person(s) Educated  Patient    Methods  Explanation;Demonstration;Tactile cues;Verbal cues;Handout    Comprehension  Verbalized understanding;Returned demonstration;Tactile cues required;Verbal cues required;Need further instruction       PT Short Term Goals - 11/19/19 1547      PT SHORT TERM GOAL #1   Title  Be independent with initial home exercise program for self-management of symptoms.    Baseline  initial HEP provided at IE  (11/13/2019);    Time  2    Period  Weeks    Status  Achieved    Target Date  11/27/19        PT Long Term Goals - 11/13/19 2041      PT LONG TERM GOAL #1   Title  Be independent with a long-term home exercise program for self-management of symptoms.    Baseline  initial HEP provided at IE (11/13/2019);    Time  12    Period  Weeks    Status  New    Target Date  02/05/20      PT LONG TERM GOAL #2   Title  Demonstrate improved FOTO score by 10 units to demonstrate improvement in overall condition and self-reported functional ability.    Baseline  deferred to visit 2 (11/13/2019);    Time  12    Period  Weeks    Status  New    Target Date  02/05/20      PT LONG TERM GOAL #3   Title  Improve BLE  strength to 4+/5 for improved ability to allow patient to complete valued functional tasks such as gardening, and walking dogs with less difficulty.    Baseline  see objective exam (11/13/2019);    Time  12    Period  Weeks    Status  New    Target Date  02/05/20      PT LONG TERM GOAL #4   Title  Complete community, work and/or recreational activities without limitation due to current condition.    Baseline  difficulty with ADLs, IADLs, basic mobility and usual activities including difficulty walking dogs, gardening, getting up and down to knees and up, standing in place long enough to check out at grocery store, limited activity tolerance, stairs, walking.    Time  12    Period  Weeks    Status  New    Target Date  02/05/20      PT LONG TERM GOAL #5   Title  Reduce pain with functional activities to equal or less than 3/10 to allow patient to complete usual activities including ADLs, IADLs, and social engagement with less difficulty.    Baseline  up to 8/10 (11/13/2019);    Time  12    Period  Weeks    Status  New    Target Date  02/05/20            Plan - 11/19/19 1546    Clinical Impression Statement  Patient tolerated treatment well overall with no significant increase  in pain during today's session. Scored low fall risk on FGA so spent time educating patient about ways to minimize the specific things that lead to her falling multiple times in the last year (one with injury). Gradually introducing more  LE strengthening and time spent moving during session with education for self monitoring and appropriate response to tolerance to HEP. Patient would benefit from continued management of limiting condition by skilled physical therapist to address remaining impairments and functional limitations to work towards stated goals and return to PLOF or maximal functional independence.    Personal Factors and Comorbidities  Age;Time since onset of injury/illness/exacerbation;Past/Current Experience;Comorbidity 3+;Other   chronicity and severity of multiple comorbid conditions   Comorbidities  Relevant past medical history and comorbidities include raynaud, lumbar and cervical spine pain with disectomies and fusions in the cervical spine (C3-C7 bone graft used), symptomatic with nausea and dizzines when moves c-spine too much, chronic fatigue syndrome (3-4 good hours a day), interstitial cystitis, DDD, scoliosis, bursitis R hip, L THA (august 2020), headaches (1x a week migraines), insomnia, fibromyalgia, reflux, bile duct stones, cholecystectomy, chronic pain, pain medication induced GI problem (managed with medication), atypical biploar disorder (fairly well managed), anxiety, osteopenia, Stargardt's disease, seronegative arthritis.    Examination-Activity Limitations  Bathing;Bend;Caring for Others;Stand;Stairs;Squat;Dressing;Transfers   difficulty with ADLs, IADLs, basic mobility and usual activities including difficulty walking dogs, gardening, getting up and down to knees and up, standing in place long enough to check out at grocery store, limited activity tolerance, stairs, walking.   Examination-Participation Restrictions  Laundry;Shop;Cleaning;Community Activity;Yard Work;Meal  Prep;Other    Stability/Clinical Decision Making  Evolving/Moderate complexity    Rehab Potential  Fair    PT Frequency  2x / week    PT Duration  12 weeks    PT Treatment/Interventions  ADLs/Self Care Home Management;Aquatic Therapy;Biofeedback;Cryotherapy;Electrical Stimulation;Moist Heat;Functional mobility training;Therapeutic activities;Therapeutic exercise;Balance training;Neuromuscular re-education;Patient/family education;Manual techniques;Passive range of motion;Dry needling;Energy conservation;Joint Manipulations    PT Next Visit Plan  Assess response to HEP, progressive LE strengthening as tolerated    PT Home Exercise Plan  Medbridge Access Code: [email or web address]    Consulted and Agree with Plan of Care  Patient       Patient will benefit from skilled therapeutic intervention in order to improve the following deficits and impairments:  Dizziness, Pain, Postural dysfunction, Decreased mobility, Decreased activity tolerance, Decreased endurance, Decreased range of motion, Decreased strength, Impaired perceived functional ability, Impaired flexibility, Difficulty walking, Decreased balance, Hypomobility, Increased muscle spasms  Visit Diagnosis: Chronic pain of right knee  Chronic pain of left knee  Difficulty in walking, not elsewhere classified  History of falling     Problem List Patient Active Problem List   Diagnosis Date Noted  . Sore throat 10/02/2019  . Positive ANA (antinuclear antibody) 06/05/2019  . 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  . Unilateral primary osteoarthritis, left hip 01/28/2019  . Pelvic pain 12/12/2018  . Common bile duct (CBD) obstruction   . Elevated alkaline phosphatase level   . Venous insufficiency of left lower extremity 07/04/2018  . Dyslipidemia 09/22/2017  . DDD (degenerative disc disease), cervical 04/03/2017  . DDD (degenerative disc disease), lumbar 04/03/2017  . Onychomycosis 03/21/2017  . Dysphagia 10/18/2016  .  Encounter for chronic pain management 10/18/2016  . Biliary stasis 09/26/2016  . Fever blister 08/18/2016  . Urinary urgency 07/05/2016  . HNP (herniated nucleus pulposus), lumbar 09/23/2015  . Sinus congestion 07/30/2015  . Iron deficiency 07/30/2015  . Health maintenance examination 06/15/2015  . 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 Headache 03/09/2015  . Clavicle enlargement 12/13/2014  . Prediabetes 12/13/2014  . Plantar fasciitis, bilateral 09/11/2014  . Advanced care planning/counseling discussion 06/10/2014  . Medicare annual wellness visit, subsequent 06/10/2014  . Abnormal  thyroid function test 06/10/2014  . Chronic pain syndrome 06/10/2014  . CFS (chronic fatigue syndrome) 04/08/2014  . Postmenopausal atrophic vaginitis 10/19/2012  . 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 Cervical disc disorder with radiculopathy of cervical region 05/28/2010  . CHEST PAIN UNSPECIFIED 02/28/2008  . APHTHOUS ULCERS 01/31/2008  . Chronic insomnia 11/09/2007  . Drug-induced constipation 10/11/2007  . Allergic rhinitis 04/12/2007  . MDD (major depressive disorder), recurrent episode, moderate ([place]) 03/05/2007  . [name] syndrome 03/05/2007  . GERD 03/05/2007  . ROSACEA 03/05/2007  . NEURALGIA 03/05/2007  . 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 Chronic interstitial cystitis 12/06/2006  . Fibromyalgia 12/06/2006    Everlean Alstrom. Graylon Good, PT, DPT 11/19/19, 3:48 PM  [facility] [address], [place], [address] Phone: [phone]   Fax:  [phone]  Name: Rachel Duran MRN: [id number] Date of Birth: [birth year]

## 2019-11-21 ENCOUNTER — Other Ambulatory Visit: Payer: Self-pay

## 2019-11-21 ENCOUNTER — Encounter: Payer: Self-pay | Admitting: Physical Therapy

## 2019-11-21 ENCOUNTER — Ambulatory Visit: Payer: Medicare Other | Admitting: Physical Therapy

## 2019-11-21 DIAGNOSIS — R262 Difficulty in walking, not elsewhere classified: Secondary | ICD-10-CM

## 2019-11-21 DIAGNOSIS — M25562 Pain in left knee: Secondary | ICD-10-CM

## 2019-11-21 DIAGNOSIS — M25561 Pain in right knee: Secondary | ICD-10-CM

## 2019-11-21 DIAGNOSIS — Z9181 History of falling: Secondary | ICD-10-CM

## 2019-11-21 DIAGNOSIS — G8929 Other chronic pain: Secondary | ICD-10-CM

## 2019-11-21 NOTE — Therapy (Signed)
[facility] [address] [place], [address] Phone: [phone]   Fax:  [phone]  Physical Therapy Treatment  Patient Details  Name: Rachel Duran MRN: [id number] Date of Birth: 03/23/1956 Referring Provider (PT): Bo Merino, MD   Encounter Date: 11/21/2019  PT End of Session - 11/21/19 1907    Visit Number  3    Number of Visits  24    Date for PT Re-Evaluation  02/05/20    Authorization Type  [employer] reporting period from 11/13/2019    Authorization Time Period  n/a    Authorization - Visit Number  3    Authorization - Number of Visits  10    Progress Note Due on Visit  10   Next FOTO due 4/21   PT Start Time  1815    PT Stop Time  1855    PT Time Calculation (min)  40 min    Activity Tolerance  Patient tolerated treatment well    Behavior During Therapy  [facility] for tasks assessed/performed       Past Medical History:  Diagnosis Date  . Abdominal pain last 4 months   and nausea also  . Allergy   . Anemia    history of  . Anxiety   . Bipolar disorder (Rachel Duran)    atpical bipolar disorder  . 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 C6- C7 -hx. past fusion(bone graft used)  . Cholecystitis   . Chronic pain   . DDD (degenerative disc disease), lumbar 09/2015   dextroscoliosis with multilevel DDD and facet arthrosis most notable for R foraminal disc protrusion L4/5 producing severe R neural foraminal stenosis abutting R L4 nerve root, moderate spinal canal and mild lat recesss and R neural foraminal stenosis L3/4 (MRI)  . Depression    bipolar depression  . Disorders of porphyrin metabolism   . [place]on of finger of left hand 11/10/2016  . Fibromyalgia   . GERD (gastroesophageal reflux disease)   . Headache    occasionally  . Internal hemorrhoids   . Interstitial cystitis 06-06-12   hx.  . Irritable bowel syndrome   . PONV (postoperative nausea and vomiting)    now uses  stomach blockers and no ponv  . 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 Raynauds disease    hx.  . Seronegative arthritis    [name]  . 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 ANTERIOR CERVICAL DECOMP/DISCECTOMY FUSION  2004   C5/6, C6/7  . ANTERIOR CERVICAL DECOMP/DISCECTOMY FUSION  02/2016   C3/4, C4/5 with plating [name])  . AUGMENTATION MAMMAPLASTY Bilateral 03/25/2010  . BREAST ENHANCEMENT SURGERY  2010  . BREAST IMPLANT EXCHANGE  10/2014   exchange saline implants, B mastopexy/capsulorraphy ([place])  . BUNIONECTOMY Bilateral yrs ago  . [place]   x 1  . CHOLECYSTECTOMY  06/11/2012   Procedure: LAPAROSCOPIC CHOLECYSTECTOMY WITH INTRAOPERATIVE CHOLANGIOGRAM;  Surgeon: [name], MD,FACS;  Location: [facility];  Service: General;  Laterality: N/A;  . COLONOSCOPY  02/2018   done for positive cologuard - WNL, rpt 10 yrs Fuller Plan)  . CYSTOSCOPY    . ERCP  05/22/2012  Procedure: ENDOSCOPIC RETROGRADE CHOLANGIOPANCREATOGRAPHY (ERCP);  Surgeon: [name], MD,FACG;  Location: [facility];  Service: Endoscopy;  Laterality: N/A;  . ERCP N/A 09/17/2013   Procedure: ENDOSCOPIC RETROGRADE CHOLANGIOPANCREATOGRAPHY (ERCP);  Surgeon: [name], MD;  Location: [facility];  Service: Endoscopy;  Laterality: N/A;  . ERCP N/A 09/27/2018   Procedure: ENDOSCOPIC RETROGRADE CHOLANGIOPANCREATOGRAPHY (ERCP);  Surgeon: [name], MD;  Location: [facility];  Service: Endoscopy;  Laterality: N/A;  . ESOPHAGOGASTRODUODENOSCOPY  09/2016   WNL. esophagus dilated Fuller Plan)  . HEMORRHOID BANDING  09-23-13   --Dr. Greer Pickerel  . HERNIA REPAIR     inguinal  . HYSTEROSCOPY W/ ENDOMETRIAL ABLATION    . NASAL SINUS SURGERY      x5  . REMOVAL OF STONES  09/27/2018   Procedure: REMOVAL OF STONES;  Surgeon: [name], MD;  Location: [facility];  Service: Endoscopy;;  . Rachel Duran  09/27/2018   Procedure: SPHINCTEROTOMY;  Surgeon: [name], MD;  Location: [facility];  Service: Endoscopy;;  . TOTAL HIP ARTHROPLASTY Left 03/12/2019   Procedure: LEFT TOTAL HIP ARTHROPLASTY ANTERIOR APPROACH;  Rachel Duran, Rachel Guest, MD)  . UPPER GASTROINTESTINAL ENDOSCOPY      There were no vitals filed for this visit.  Subjective Assessment - 11/21/19 1813    Subjective  Patient reports she is feeling well today and has been very busy. Walked 2 miles with the dogs, spent about 1.5 hours in the yard working with flowers (up and down on knees with cushion), did morning exercises so her knees are hurting a bit. But today is a good day. She had nothing more than mild soreness following last treatemnt session. Says she thinks she may need to be gentle in PT today due to being so active othewise and she feels like she is still getting used to the increase in HEP.    Pertinent History  Patient is a 64 y.o. female who presents to outpatient physical therapy with a referral for medical diagnosis primary osteoarthritis of both knees, chronic pain of both knees, chondromalacia patella - bilateral knees. This patient's chief complaints consist of knee pain, weakness, poor activity tolerance leading to the following functional deficits: difficulty with ADLs, IADLs, basic mobility and usual activities including difficulty walking dogs, gardening, getting up and down to knees and up, standing in place long enough to check out at grocery store, limited activity tolerance, stairs, walking. Relevant past medical history and comorbidities include raynaud, lumbar and cervical spine pain with disectomies and fusions in the cervical spine (C3-C7 bone graft used), symptomatic with nausea and dizzines when moves c-spine too much, chronic fatigue  syndrome (3-4 good hours a day), interstitial cystitis, DDD, scoliosis, bursitis R hip, L THA (august 2020), headaches (1x a week migraines), insomnia, fibromyalgia, reflux, bile duct stones, cholecystectomy, chronic pain, pain medication induced GI problem (managed with medication), atypical biploar disorder (fairly well managed), anxiety, osteopenia, Stargardt's disease, seronegative arhtritis. Patient denies hx of cancer, stroke, seizures, lung problem, major cardiac events, diabetes, unexplained weight loss, changes in bowel or bladder problems, saddle paresthesia, new onset stumbling or dropping things.    Limitations  Standing;Walking;House hold activities;Other (comment)   difficulty with ADLs, IADLs, basic mobility and usual activities including difficulty walking dogs, gardening, getting up and down to knees and up, standing in place long enough to check out at grocery store, limited activity tolerance, stairs, walking.   How long can you sit comfortably?  other things bothers her in sitting, knees no worse  How long can you stand comfortably?  10 min (not comfortable)    How long can you walk comfortably?  25 min before having to stop (not comfortable).    Diagnostic tests  radiograph of bilateral knees report 09/05/2019: "Mild to moderate medial compartment narrowing was noted.  No chondrocalcinosis was noted.  Severe patellofemoral narrowing was noted. Impression: These findings are consistent with mild to moderate osteoarthritis and severe chondromalacia patella."    Patient Stated Goals  wants to get stronger, feels LE very weak, get a good exercise program, how to manage exercise, times of day and what types of exercise, and make knee pain go away.    Currently in Pain?  Yes    Pain Score  5     Pain Onset  More than a month ago       OBJECTIVE FOTO = 40 (11/19/2019);   TREATMENT: Significant comorbidities - see subjective   Manual therapy: to reduce pain and tissue tension,  improve range of motion, neuromodulation, in order to promote improved ability to complete functional activities. supine with knees propped up on two pillows:  - gentle STM to bilateral quadruceps, hamstrings, and lower legs to improve circulation, decrease tension, improve pain response.  - gentle bilateral patellar mobilizations grades II-III medial and caudal - hooklying grade I-II tibiofemoral and fibular head AP and PA mobilizations for decreased pain.    Therapeutic exercise:to centralize symptoms and improve ROM, strength, muscular endurance, and activity tolerance required for successful completion of functional activities. - seated hamstring curl with single yellow theraband with gentle resistance held by PT, x15 each side.   Neuromuscular Re-education: to improve, balance, postural strength, muscle activation patterns, and stabilization strength required for functional activities (SBA and touchdown support as needed for safety): - balance on airex in tandem stance 3x30 seconds each side - single leg balance on firm surface 3x30 seconds.   Pt required multimodal cuing for proper technique and to facilitate improved neuromuscular control, strength, range of motion, and functional ability resulting in improved performance and form.  HOME EXERCISE PROGRAM Access Code: [email or web address] URL: [email or web address] Date: 11/19/2019 Prepared by: Rosita Kea  Exercises Seated Isometric Knee Extension - 1-2 x daily - 1 sets - 10 reps - 4 seconds hold Seated Long Arc Quad - 1-2 x daily - 1 sets - 10 reps - 4 seconds hold Supine Active Straight Leg Raise - 1-2 x daily - 1 sets - 10 reps - 2 seconds hold   PT Education - 11/21/19 1907    Education Details  Exercise purpose/form. Self management techniques    Person(s) Educated  Patient    Methods  Explanation;Demonstration;Tactile cues;Verbal cues    Comprehension  Verbalized understanding;Returned demonstration;Verbal cues  required;Tactile cues required;Need further instruction       PT Short Term Goals - 11/19/19 1547      PT SHORT TERM GOAL #1   Title  Be independent with initial home exercise program for self-management of symptoms.    Baseline  initial HEP provided at IE (11/13/2019);    Time  2    Period  Weeks    Status  Achieved    Target Date  11/27/19        PT Long Term Goals - 11/13/19 2041      PT LONG TERM GOAL #1   Title  Be independent with a long-term home exercise program for self-management of symptoms.    Baseline  initial HEP provided at IE (11/13/2019);  Time  12    Period  Weeks    Status  New    Target Date  02/05/20      PT LONG TERM GOAL #2   Title  Demonstrate improved FOTO score by 10 units to demonstrate improvement in overall condition and self-reported functional ability.    Baseline  deferred to visit 2 (11/13/2019);    Time  12    Period  Weeks    Status  New    Target Date  02/05/20      PT LONG TERM GOAL #3   Title  Improve BLE  strength to 4+/5 for improved ability to allow patient to complete valued functional tasks such as gardening, and walking dogs with less difficulty.    Baseline  see objective exam (11/13/2019);    Time  12    Period  Weeks    Status  New    Target Date  02/05/20      PT LONG TERM GOAL #4   Title  Complete community, work and/or recreational activities without limitation due to current condition.    Baseline  difficulty with ADLs, IADLs, basic mobility and usual activities including difficulty walking dogs, gardening, getting up and down to knees and up, standing in place long enough to check out at grocery store, limited activity tolerance, stairs, walking.    Time  12    Period  Weeks    Status  New    Target Date  02/05/20      PT LONG TERM GOAL #5   Title  Reduce pain with functional activities to equal or less than 3/10 to allow patient to complete usual activities including ADLs, IADLs, and social engagement with less  difficulty.    Baseline  up to 8/10 (11/13/2019);    Time  12    Period  Weeks    Status  New    Target Date  02/05/20            Plan - 11/21/19 1914    Clinical Impression Statement  Patient tolerated treatment well overall with mild increase in "wooziness" feeling during balance exercise and fatigued noted in the hamstrings with curls. Reported manual felt good following. Focused on light activity and pain relief/proprioceptive activities due to high load of activity pt already completed today compared to her baseline. Discussed potential for soreness tomorrow. Patient would benefit from continued management of limiting condition by skilled physical therapist to address remaining impairments and functional limitations to work towards stated goals and return to PLOF or maximal functional independence.    Personal Factors and Comorbidities  Age;Time since onset of injury/illness/exacerbation;Past/Current Experience;Comorbidity 3+;Other   chronicity and severity of multiple comorbid conditions   Comorbidities  Relevant past medical history and comorbidities include raynaud, lumbar and cervical spine pain with disectomies and fusions in the cervical spine (C3-C7 bone graft used), symptomatic with nausea and dizzines when moves c-spine too much, chronic fatigue syndrome (3-4 good hours a day), interstitial cystitis, DDD, scoliosis, bursitis R hip, L THA (august 2020), headaches (1x a week migraines), insomnia, fibromyalgia, reflux, bile duct stones, cholecystectomy, chronic pain, pain medication induced GI problem (managed with medication), atypical biploar disorder (fairly well managed), anxiety, osteopenia, Stargardt's disease, seronegative arthritis.    Examination-Activity Limitations  Bathing;Bend;Caring for Others;Stand;Stairs;Squat;Dressing;Transfers   difficulty with ADLs, IADLs, basic mobility and usual activities including difficulty walking dogs, gardening, getting up and down to knees  and up, standing in place long enough to check out at  grocery store, limited activity tolerance, stairs, walking.   Examination-Participation Restrictions  Laundry;Shop;Cleaning;Community Activity;Yard Work;Meal Prep;Other    Stability/Clinical Decision Making  Evolving/Moderate complexity    Rehab Potential  Fair    PT Frequency  2x / week    PT Duration  12 weeks    PT Treatment/Interventions  ADLs/Self Care Home Management;Aquatic Therapy;Biofeedback;Cryotherapy;Electrical Stimulation;Moist Heat;Functional mobility training;Therapeutic activities;Therapeutic exercise;Balance training;Neuromuscular re-education;Patient/family education;Manual techniques;Passive range of motion;Dry needling;Energy conservation;Joint Manipulations    PT Next Visit Plan  Assess response to HEP, progressive LE strengthening as tolerated    PT Home Exercise Plan  Medbridge Access Code: [email or web address]    Consulted and Agree with Plan of Care  Patient       Patient will benefit from skilled therapeutic intervention in order to improve the following deficits and impairments:  Dizziness, Pain, Postural dysfunction, Decreased mobility, Decreased activity tolerance, Decreased endurance, Decreased range of motion, Decreased strength, Impaired perceived functional ability, Impaired flexibility, Difficulty walking, Decreased balance, Hypomobility, Increased muscle spasms  Visit Diagnosis: Chronic pain of right knee  Chronic pain of left knee  Difficulty in walking, not elsewhere classified  History of falling     Problem List Patient Active Problem List   Diagnosis Date Noted  . Sore throat 10/02/2019  . Positive ANA (antinuclear antibody) 06/05/2019  . 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  . Unilateral primary osteoarthritis, left hip 01/28/2019  . Pelvic pain 12/12/2018  . Common bile duct (CBD) obstruction   . Elevated alkaline phosphatase level   . Venous insufficiency of left lower extremity  07/04/2018  . Dyslipidemia 09/22/2017  . DDD (degenerative disc disease), cervical 04/03/2017  . DDD (degenerative disc disease), lumbar 04/03/2017  . Onychomycosis 03/21/2017  . Dysphagia 10/18/2016  . Encounter for chronic pain management 10/18/2016  . Biliary stasis 09/26/2016  . Fever blister 08/18/2016  . Urinary urgency 07/05/2016  . HNP (herniated nucleus pulposus), lumbar 09/23/2015  . Sinus congestion 07/30/2015  . Iron deficiency 07/30/2015  . Health maintenance examination 06/15/2015  . 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 Headache 03/09/2015  . Clavicle enlargement 12/13/2014  . Prediabetes 12/13/2014  . Plantar fasciitis, bilateral 09/11/2014  . Advanced care planning/counseling discussion 06/10/2014  . Medicare annual wellness visit, subsequent 06/10/2014  . Abnormal thyroid function test 06/10/2014  . Chronic pain syndrome 06/10/2014  . CFS (chronic fatigue syndrome) 04/08/2014  . Postmenopausal atrophic vaginitis 10/19/2012  . 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 Cervical disc disorder with radiculopathy of cervical region 05/28/2010  . CHEST PAIN UNSPECIFIED 02/28/2008  . APHTHOUS ULCERS 01/31/2008  . Chronic insomnia 11/09/2007  . Drug-induced constipation 10/11/2007  . Allergic rhinitis 04/12/2007  . MDD (major depressive disorder), recurrent episode, moderate ([place]) 03/05/2007  . [name] syndrome 03/05/2007  . GERD 03/05/2007  . ROSACEA 03/05/2007  . NEURALGIA 03/05/2007  . 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[place]) 12/06/2006  . Chronic interstitial cystitis 12/06/2006  . Fibromyalgia 12/06/2006    Everlean Alstrom. Graylon Good, PT, DPT 11/21/19, 7:17 PM  [facility] [address] [place], [address] Phone: [phone]   Fax:  [phone]  Name: Ajna Matos MRN: [id number] Date of Birth: Nov 11, 1955

## 2019-11-25 ENCOUNTER — Ambulatory Visit: Payer: Medicare Other | Attending: Rheumatology | Admitting: Physical Therapy

## 2019-11-25 ENCOUNTER — Other Ambulatory Visit: Payer: Self-pay

## 2019-11-25 ENCOUNTER — Other Ambulatory Visit: Payer: Self-pay | Admitting: Family Medicine

## 2019-11-25 ENCOUNTER — Encounter: Payer: Self-pay | Admitting: Physical Therapy

## 2019-11-25 ENCOUNTER — Encounter: Payer: Medicare Other | Admitting: Physical Therapy

## 2019-11-25 DIAGNOSIS — M25561 Pain in right knee: Secondary | ICD-10-CM | POA: Insufficient documentation

## 2019-11-25 DIAGNOSIS — Z9181 History of falling: Secondary | ICD-10-CM | POA: Diagnosis present

## 2019-11-25 DIAGNOSIS — M25562 Pain in left knee: Secondary | ICD-10-CM | POA: Insufficient documentation

## 2019-11-25 DIAGNOSIS — G8929 Other chronic pain: Secondary | ICD-10-CM | POA: Insufficient documentation

## 2019-11-25 DIAGNOSIS — R262 Difficulty in walking, not elsewhere classified: Secondary | ICD-10-CM | POA: Diagnosis present

## 2019-11-25 NOTE — Therapy (Signed)
[facility] [address] [place], [address] Phone: [phone]   Fax:  [phone]  Physical Therapy Treatment  Patient Details  Name: Rachel Duran MRN: [id number] Date of Birth: May 11, 1956 Referring Provider (PT): Bo Merino, MD   Encounter Date: 11/25/2019  PT End of Session - 11/25/19 2027    Visit Number  4    Number of Visits  24    Date for PT Re-Evaluation  02/05/20    Authorization Type  [employer] reporting period from 11/13/2019    Authorization Time Period  n/a    Authorization - Visit Number  4    Authorization - Number of Visits  10    Progress Note Due on Visit  10   Next FOTO due 4/21   PT Start Time  1300    PT Stop Time  1343    PT Time Calculation (min)  43 min    Activity Tolerance  Patient tolerated treatment well    Behavior During Therapy  [facility] for tasks assessed/performed       Past Medical History:  Diagnosis Date  . Abdominal pain last 4 months   and nausea also  . Allergy   . Anemia    history of  . Anxiety   . Bipolar disorder ([place])    atpical bipolar disorder  . 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 C6- C7 -hx. past fusion(bone graft used)  . Cholecystitis   . Chronic pain   . DDD (degenerative disc disease), lumbar 09/2015   dextroscoliosis with multilevel DDD and facet arthrosis most notable for R foraminal disc protrusion L4/5 producing severe R neural foraminal stenosis abutting R L4 nerve root, moderate spinal canal and mild lat recesss and R neural foraminal stenosis L3/4 (MRI)  . Depression    bipolar depression  . Disorders of porphyrin metabolism   . [place]on of finger of left hand 11/10/2016  . Fibromyalgia   . GERD (gastroesophageal reflux disease)   . Headache    occasionally  . Internal hemorrhoids   . Interstitial cystitis 06-06-12   hx.  . Irritable bowel syndrome   . PONV (postoperative nausea and vomiting)    now uses  stomach blockers and no ponv  . 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 Raynauds disease    hx.  . Seronegative arthritis    [name]  . 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 ANTERIOR CERVICAL DECOMP/DISCECTOMY FUSION  2004   C5/6, C6/7  . ANTERIOR CERVICAL DECOMP/DISCECTOMY FUSION  02/2016   C3/4, C4/5 with plating [name])  . AUGMENTATION MAMMAPLASTY Bilateral 03/25/2010  . BREAST ENHANCEMENT SURGERY  2010  . BREAST IMPLANT EXCHANGE  10/2014   exchange saline implants, B mastopexy/capsulorraphy ([facility])  . BUNIONECTOMY Bilateral yrs ago  . [place]   x 1  . CHOLECYSTECTOMY  06/11/2012   Procedure: LAPAROSCOPIC CHOLECYSTECTOMY WITH INTRAOPERATIVE CHOLANGIOGRAM;  Surgeon: [name], MD,FACS;  Location: [facility];  Service: General;  Laterality: N/A;  . COLONOSCOPY  02/2018   done for positive cologuard - WNL, rpt 10 yrs Fuller Plan)  . CYSTOSCOPY    . ERCP  05/22/2012  Procedure: ENDOSCOPIC RETROGRADE CHOLANGIOPANCREATOGRAPHY (ERCP);  Surgeon: [name], MD,FACG;  Location: [facility];  Service: Endoscopy;  Laterality: N/A;  . ERCP N/A 09/17/2013   Procedure: ENDOSCOPIC RETROGRADE CHOLANGIOPANCREATOGRAPHY (ERCP);  Surgeon: [name], MD;  Location: [facility];  Service: Endoscopy;  Laterality: N/A;  . ERCP N/A 09/27/2018   Procedure: ENDOSCOPIC RETROGRADE CHOLANGIOPANCREATOGRAPHY (ERCP);  Surgeon: [name], MD;  Location: [facility];  Service: Endoscopy;  Laterality: N/A;  . ESOPHAGOGASTRODUODENOSCOPY  09/2016   WNL. esophagus dilated Fuller Plan)  . HEMORRHOID BANDING  09-23-13   --Dr. Greer Pickerel  . HERNIA REPAIR     inguinal  . HYSTEROSCOPY W/ ENDOMETRIAL ABLATION    . NASAL SINUS SURGERY      x5  . REMOVAL OF STONES  09/27/2018   Procedure: REMOVAL OF STONES;  Surgeon: [name], MD;  Location: [facility];  Service: Endoscopy;;  . [name]  09/27/2018   Procedure: SPHINCTEROTOMY;  Surgeon: [name], MD;  Location: [facility];  Service: Endoscopy;;  . TOTAL HIP ARTHROPLASTY Left 03/12/2019   Procedure: LEFT TOTAL HIP ARTHROPLASTY ANTERIOR APPROACH;  [name], [name], MD)  . UPPER GASTROINTESTINAL ENDOSCOPY      There were no vitals filed for this visit.  Subjective Assessment - 11/25/19 1305    Subjective  Patinet reports she had to pull over after last session due to increased dizziness that worsened following last treatment session and that bothered her all weekend. She thinks it was provoked by the balance exercises. She also experienced a lot of neck pain over the weekend, and that is still worse than her knee pain currently. She attributes it to a ruptured disc in her neck that may have been irritated. She does not recall being given a reason for her dizziness and denies being told it was related to medication side effects. She has been to an ENT but related to sinus problems with history of 5 sinus operations. Describes dizziness as everything jerking up and down and a tipping horizon. (not spinning, lightneadendes, or floaty/drunk feeling). She reports overall her knees seem to be getting better and she feels benefit from the knee exercises she has been doing. She would like to know more about the goals for physical therapy. 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    Pertinent History  Patient is a 64 y.o. female who presents to outpatient physical therapy with a referral for medical diagnosis primary osteoarthritis of both knees, chronic pain of both knees, chondromalacia patella - bilateral knees. This patient's chief complaints consist of knee pain, weakness, poor activity  tolerance leading to the following functional deficits: difficulty with ADLs, IADLs, basic mobility and usual activities including difficulty walking dogs, gardening, getting up and down to knees and up, standing in place long enough to check out at grocery store, limited activity tolerance, stairs, walking. Relevant past medical history and comorbidities include raynaud, lumbar and cervical spine pain with disectomies and fusions in the cervical spine (C3-C7 bone graft used), symptomatic with nausea and dizzines when moves c-spine too much, chronic fatigue syndrome (3-4 good hours a day), interstitial cystitis, DDD, scoliosis, bursitis R hip, L THA (august 2020), headaches (1x a week migraines), insomnia, fibromyalgia, reflux, bile duct stones, cholecystectomy, chronic pain, pain medication induced GI problem (managed with medication), atypical biploar disorder (fairly well managed), anxiety, osteopenia, Stargardt's disease, seronegative arhtritis. Patient denies hx of cancer, stroke, seizures, lung  problem, major cardiac events, diabetes, unexplained weight loss, changes in bowel or bladder problems, saddle paresthesia, new onset stumbling or dropping things.    Limitations  Standing;Walking;House hold activities;Other (comment)   difficulty with ADLs, IADLs, basic mobility and usual activities including difficulty walking dogs, gardening, getting up and down to knees and up, standing in place long enough to check out at grocery store, limited activity tolerance, stairs, walking.   How long can you sit comfortably?  other things bothers her in sitting, knees no worse    How long can you stand comfortably?  10 min (not comfortable)    How long can you walk comfortably?  25 min before having to stop (not comfortable).    Diagnostic tests  radiograph of bilateral knees report 09/05/2019: "Mild to moderate medial compartment narrowing was noted.  No chondrocalcinosis was noted.  Severe patellofemoral narrowing  was noted. Impression: These findings are consistent with mild to moderate osteoarthritis and severe chondromalacia patella."    Patient Stated Goals  wants to get stronger, feels LE very weak, get a good exercise program, how to manage exercise, times of day and what types of exercise, and make knee pain go away.    Currently in Pain?  Yes    Pain Onset  More than a month ago           OBJECTIVE FOTO = 40 (11/19/2019);  TREATMENT: Significant comorbidities - see subjective   Therapeutic exercise:to centralize symptoms and improve ROM, strength, muscular endurance, and activity tolerance required for successful completion of functional activities. - education and discussion about goals for PT (eliminate her more recent onset knee pain to help her get back to baseline, and do something meaningful towards that goal and the pt's goals each session), pain science as relevant to her condition, tendinopathy as related to pt, and chondromalacia patellae as related to pt.  Review/update of HEP:  - seated isometric knee extension/flexion using contralateral LEs against each other. 4 second holds x 15 each side.  - seated AROM long arc quad with 3secH, x 15 each side - Hooklying straight leg raise with quad set, x15 each side - extensive education and instructions on how to slowly advance these as tolerated (see HEP instructions below).  - bridges x10-15 with cuing for improved glute contraction and cuing for variations in position that could affect tolerance at neck. Pt wanted to perform, but did say it bothered her neck some.  - standing hip extension while laying over elevated plinth to simulate her bed at home for a more comfortable way to work glutes and hip extension. X 10-15 each side. Preferred over bridges to added to HEP.  Pt required multimodal cuing for proper technique and to facilitate improved neuromuscular control, strength, range of motion, and functional ability resulting in  improved performance and form.  HOME EXERCISE PROGRAM Access Code: [email or web address] URL: [email or web address] Date: 11/25/2019 Prepared by: Rosita Kea  Program Notes Think of each line as a difficulty level. Your goal is to gradually get to the hardest (the bottom). Work through each difficulty level by performing that many reps for each exercise 2 times a day. Once you are sure you can do a given level without causing worsening in symptoms, move down to the next level. To go to the next level you want no worse pain during the exercise, the hours following the exercise, or the next day.   1 set of 10 1 set of 15 2 sets of 10 2 sets  of 15 3 sets of 10 3 sets of 15   Exercises Seated Isometric Knee Extension - 2 x daily - 4 seconds hold Seated Long Arc Quad - 2 x daily - 4 seconds hold Supine Active Straight Leg Raise - 2 x daily - 2 seconds hold     PT Education - 11/25/19 2027    Education Details  Exercise purpose/form. Self management techniques    Person(s) Educated  Patient    Methods  Explanation;Demonstration;Tactile cues;Verbal cues;Handout    Comprehension  Returned demonstration;Tactile cues required;Verbal cues required;Verbalized understanding;Need further instruction       PT Short Term Goals - 11/19/19 1547      PT SHORT TERM GOAL #1   Title  Be independent with initial home exercise program for self-management of symptoms.    Baseline  initial HEP provided at IE (11/13/2019);    Time  2    Period  Weeks    Status  Achieved    Target Date  11/27/19        PT Long Term Goals - 11/13/19 2041      PT LONG TERM GOAL #1   Title  Be independent with a long-term home exercise program for self-management of symptoms.    Baseline  initial HEP provided at IE (11/13/2019);    Time  12    Period  Weeks    Status  New    Target Date  02/05/20      PT LONG TERM GOAL #2   Title  Demonstrate improved FOTO score by 10 units to demonstrate improvement in  overall condition and self-reported functional ability.    Baseline  deferred to visit 2 (11/13/2019);    Time  12    Period  Weeks    Status  New    Target Date  02/05/20      PT LONG TERM GOAL #3   Title  Improve BLE  strength to 4+/5 for improved ability to allow patient to complete valued functional tasks such as gardening, and walking dogs with less difficulty.    Baseline  see objective exam (11/13/2019);    Time  12    Period  Weeks    Status  New    Target Date  02/05/20      PT LONG TERM GOAL #4   Title  Complete community, work and/or recreational activities without limitation due to current condition.    Baseline  difficulty with ADLs, IADLs, basic mobility and usual activities including difficulty walking dogs, gardening, getting up and down to knees and up, standing in place long enough to check out at grocery store, limited activity tolerance, stairs, walking.    Time  12    Period  Weeks    Status  New    Target Date  02/05/20      PT LONG TERM GOAL #5   Title  Reduce pain with functional activities to equal or less than 3/10 to allow patient to complete usual activities including ADLs, IADLs, and social engagement with less difficulty.    Baseline  up to 8/10 (11/13/2019);    Time  12    Period  Weeks    Status  New    Target Date  02/05/20            Plan - 11/25/19 2027    Clinical Impression Statement  Patient appeared to tolerate this session well, stating any slight dizziness she felt while completing bed mobility felt acceptable  to her and not likely to cause worsening of those symptoms. She did not tolerate balance exercises from last session well and there does not appear to be a clear single cause identified yet for her dizziness. Patient may benefit from further evaluation of the cause of this and possible solutions due to strong negative impact his has on her function and well being. Recommend  follow up with medical team at the discretion of pt. May  benefit from further vestibular testing. Patient agreed that she may benefit from less frequent visits in the future due to slow nature of recovery from tendinopathy, low activity tolerance, and good adherence to HEP. This will be considered for the future. Patient would benefit from sooner check in to review her ability to tolerate the new exercises from today as well as understanding the progressions prescribed for the HEP. Patient would benefit from continued management of limiting condition by skilled physical therapist to address remaining impairments and functional limitations to work towards stated goals and return to PLOF or maximal functional independence.    Personal Factors and Comorbidities  Age;Time since onset of injury/illness/exacerbation;Past/Current Experience;Comorbidity 3+;Other   chronicity and severity of multiple comorbid conditions   Comorbidities  Relevant past medical history and comorbidities include raynaud, lumbar and cervical spine pain with disectomies and fusions in the cervical spine (C3-C7 bone graft used), symptomatic with nausea and dizzines when moves c-spine too much, chronic fatigue syndrome (3-4 good hours a day), interstitial cystitis, DDD, scoliosis, bursitis R hip, L THA (august 2020), headaches (1x a week migraines), insomnia, fibromyalgia, reflux, bile duct stones, cholecystectomy, chronic pain, pain medication induced GI problem (managed with medication), atypical biploar disorder (fairly well managed), anxiety, osteopenia, Stargardt's disease, seronegative arthritis.    Examination-Activity Limitations  Bathing;Bend;Caring for Others;Stand;Stairs;Squat;Dressing;Transfers   difficulty with ADLs, IADLs, basic mobility and usual activities including difficulty walking dogs, gardening, getting up and down to knees and up, standing in place long enough to check out at grocery store, limited activity tolerance, stairs, walking.   Examination-Participation Restrictions   Laundry;Shop;Cleaning;Community Activity;Yard Work;Meal Prep;Other    Stability/Clinical Decision Making  Evolving/Moderate complexity    Rehab Potential  Fair    PT Frequency  2x / week    PT Duration  12 weeks    PT Treatment/Interventions  ADLs/Self Care Home Management;Aquatic Therapy;Biofeedback;Cryotherapy;Electrical Stimulation;Moist Heat;Functional mobility training;Therapeutic activities;Therapeutic exercise;Balance training;Neuromuscular re-education;Patient/family education;Manual techniques;Passive range of motion;Dry needling;Energy conservation;Joint Manipulations    PT Next Visit Plan  Assess response to HEP, progressive LE strengthening as tolerated    PT Home Exercise Plan  Medbridge Access Code: [email or web address]    Consulted and Agree with Plan of Care  Patient       Patient will benefit from skilled therapeutic intervention in order to improve the following deficits and impairments:  Dizziness, Pain, Postural dysfunction, Decreased mobility, Decreased activity tolerance, Decreased endurance, Decreased range of motion, Decreased strength, Impaired perceived functional ability, Impaired flexibility, Difficulty walking, Decreased balance, Hypomobility, Increased muscle spasms  Visit Diagnosis: Chronic pain of right knee  Chronic pain of left knee  Difficulty in walking, not elsewhere classified  History of falling     Problem List Patient Active Problem List   Diagnosis Date Noted  . Sore throat 10/02/2019  . Positive ANA (antinuclear antibody) 06/05/2019  . 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  . Unilateral primary osteoarthritis, left hip 01/28/2019  . Pelvic pain 12/12/2018  . Common bile duct (CBD) obstruction   . Elevated alkaline phosphatase level   .  Venous insufficiency of left lower extremity 07/04/2018  . Dyslipidemia 09/22/2017  . DDD (degenerative disc disease), cervical 04/03/2017  . DDD (degenerative disc disease), lumbar 04/03/2017  .  Onychomycosis 03/21/2017  . Dysphagia 10/18/2016  . Encounter for chronic pain management 10/18/2016  . Biliary stasis 09/26/2016  . Fever blister 08/18/2016  . Urinary urgency 07/05/2016  . HNP (herniated nucleus pulposus), lumbar 09/23/2015  . Sinus congestion 07/30/2015  . Iron deficiency 07/30/2015  . Health maintenance examination 06/15/2015  . 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 Headache 03/09/2015  . Clavicle enlargement 12/13/2014  . Prediabetes 12/13/2014  . Plantar fasciitis, bilateral 09/11/2014  . Advanced care planning/counseling discussion 06/10/2014  . Medicare annual wellness visit, subsequent 06/10/2014  . Abnormal thyroid function test 06/10/2014  . Chronic pain syndrome 06/10/2014  . CFS (chronic fatigue syndrome) 04/08/2014  . Postmenopausal atrophic vaginitis 10/19/2012  . 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 Cervical disc disorder with radiculopathy of cervical region 05/28/2010  . CHEST PAIN UNSPECIFIED 02/28/2008  . APHTHOUS ULCERS 01/31/2008  . Chronic insomnia 11/09/2007  . Drug-induced constipation 10/11/2007  . Allergic rhinitis 04/12/2007  . MDD (major depressive disorder), recurrent episode, moderate ([place]) 03/05/2007  . [name] syndrome 03/05/2007  . GERD 03/05/2007  . ROSACEA 03/05/2007  . NEURALGIA 03/05/2007  . 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 Chronic interstitial cystitis 12/06/2006  . Fibromyalgia 12/06/2006    Everlean Alstrom. Graylon Good, PT, DPT 11/25/19, 8:30 PM  [facility] [address], [place], [address] Phone: [phone]   Fax:  [phone]  Name: Rachel Duran MRN: [id number] Date of Birth: 08-20-1955

## 2019-11-26 ENCOUNTER — Encounter: Payer: Self-pay | Admitting: Family Medicine

## 2019-11-26 NOTE — Telephone Encounter (Signed)
Name of Medication: Morphine Name of Pharmacy: [facility] or Written Date and Quantity: 10/08/19, #120 Last Office Visit and Type: 11/04/19, chronic pain mngmt Next Office Visit and Type: 02/03/20, chronic pain mngmt Last Controlled Substance Agreement Date: 04/19/19 Last UDS: 04/19/19

## 2019-11-27 ENCOUNTER — Other Ambulatory Visit: Payer: Self-pay

## 2019-11-27 ENCOUNTER — Ambulatory Visit: Payer: Medicare Other | Admitting: Physical Therapy

## 2019-11-27 DIAGNOSIS — M25561 Pain in right knee: Secondary | ICD-10-CM | POA: Diagnosis not present

## 2019-11-27 DIAGNOSIS — G8929 Other chronic pain: Secondary | ICD-10-CM

## 2019-11-27 DIAGNOSIS — M25562 Pain in left knee: Secondary | ICD-10-CM

## 2019-11-27 DIAGNOSIS — R262 Difficulty in walking, not elsewhere classified: Secondary | ICD-10-CM

## 2019-11-27 DIAGNOSIS — Z9181 History of falling: Secondary | ICD-10-CM

## 2019-11-27 NOTE — Telephone Encounter (Signed)
Sent in

## 2019-11-27 NOTE — Therapy (Signed)
[facility] [address] [place], [address] Phone: [phone]   Fax:  [phone]  Physical Therapy Treatment  Patient Details  Name: Rachel Duran MRN: [id number] Date of Birth: [birth year] Referring Provider (PT): Bo Merino, MD   Encounter Date: 11/27/2019  PT End of Session - 11/27/19 0938    Visit Number  5    Number of Visits  24    Date for PT Re-Evaluation  02/05/20    Authorization Type  [employer] reporting period from 11/13/2019    Authorization Time Period  n/a    Authorization - Visit Number  5    Authorization - Number of Visits  10    Progress Note Due on Visit  10   Next FOTO due 4/21   PT Start Time  0900    PT Stop Time  0940    PT Time Calculation (min)  40 min    Activity Tolerance  Patient tolerated treatment well;No increased pain    Behavior During Therapy  WFL for tasks assessed/performed       Past Medical History:  Diagnosis Date  . Abdominal pain last 4 months   and nausea also  . Allergy   . Anemia    history of  . Anxiety   . Bipolar disorder ([place])    atpical bipolar disorder  . 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 C6- C7 -hx. past fusion(bone graft used)  . Cholecystitis   . Chronic pain   . DDD (degenerative disc disease), lumbar 09/2015   dextroscoliosis with multilevel DDD and facet arthrosis most notable for R foraminal disc protrusion L4/5 producing severe R neural foraminal stenosis abutting R L4 nerve root, moderate spinal canal and mild lat recesss and R neural foraminal stenosis L3/4 (MRI)  . Depression    bipolar depression  . Disorders of porphyrin metabolism   . Felon of finger of left hand 11/10/2016  . Fibromyalgia   . GERD (gastroesophageal reflux disease)   . Headache    occasionally  . Internal hemorrhoids   . Interstitial cystitis 06-06-12   hx.  . Irritable bowel syndrome   . PONV (postoperative nausea and vomiting)     now uses stomach blockers and no ponv  . 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 Raynauds disease    hx.  . Seronegative arthritis    [name]  . 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 ANTERIOR CERVICAL DECOMP/DISCECTOMY FUSION  2004   C5/6, C6/7  . ANTERIOR CERVICAL DECOMP/DISCECTOMY FUSION  02/2016   C3/4, C4/5 with plating [name])  . AUGMENTATION MAMMAPLASTY Bilateral 03/25/2010  . BREAST ENHANCEMENT SURGERY  2010  . BREAST IMPLANT EXCHANGE  10/2014   exchange saline implants, B mastopexy/capsulorraphy ([facility])  . BUNIONECTOMY Bilateral yrs ago  . [place]   x 1  . CHOLECYSTECTOMY  06/11/2012   Procedure: LAPAROSCOPIC CHOLECYSTECTOMY WITH INTRAOPERATIVE CHOLANGIOGRAM;  Surgeon: [name], MD,FACS;  Location: [facility];  Service: General;  Laterality: N/A;  . COLONOSCOPY  02/2018   done for positive cologuard - WNL, rpt 10 yrs Fuller Plan)  . CYSTOSCOPY    . ERCP  05/22/2012   Procedure: ENDOSCOPIC RETROGRADE CHOLANGIOPANCREATOGRAPHY (ERCP);  Surgeon: [name], MD,FACG;  Location: [facility];  Service: Endoscopy;  Laterality: N/A;  . ERCP N/A 09/17/2013   Procedure: ENDOSCOPIC RETROGRADE CHOLANGIOPANCREATOGRAPHY (ERCP);  Surgeon: [name], MD;  Location: [facility];  Service: Endoscopy;  Laterality: N/A;  . ERCP N/A 09/27/2018   Procedure: ENDOSCOPIC RETROGRADE CHOLANGIOPANCREATOGRAPHY (ERCP);  Surgeon: [name], MD;  Location: [facility];  Service: Endoscopy;  Laterality: N/A;  . ESOPHAGOGASTRODUODENOSCOPY  09/2016   WNL. esophagus dilated Fuller Plan)  . HEMORRHOID BANDING  09-23-13   --Dr. Greer Pickerel  . HERNIA REPAIR     inguinal  . HYSTEROSCOPY W/ ENDOMETRIAL ABLATION    . NASAL SINUS  SURGERY     x5  . REMOVAL OF STONES  09/27/2018   Procedure: REMOVAL OF STONES;  Surgeon: [name], MD;  Location: [facility];  Service: Endoscopy;;  . [name]  09/27/2018   Procedure: SPHINCTEROTOMY;  Surgeon: [name], MD;  Location: [facility];  Service: Endoscopy;;  . TOTAL HIP ARTHROPLASTY Left 03/12/2019   Procedure: LEFT TOTAL HIP ARTHROPLASTY ANTERIOR APPROACH;  [name], [name], MD)  . UPPER GASTROINTESTINAL ENDOSCOPY      There were no vitals filed for this visit.  Subjective Assessment - 11/27/19 0911    Subjective  Patient reports she had a lot of pain over the last few days following last treatment session. She think the long arc quad is what is bothering her knees the most. Her pain went up to 8/10 in the right knee expecially yesterday and she had to use her cane. Feeling better this morning. Glad she stopped the bridges when she did because it was hurting her neck. She did not have a lot of increased neck pain or problems with dizziness following last session. She wants to know what she should do to make her exercises more tolerable and has been attempting to adjust them some on her own.    Pertinent History  Patient is a 64 y.o. female who presents to outpatient physical therapy with a referral for medical diagnosis primary osteoarthritis of both knees, chronic pain of both knees, chondromalacia patella - bilateral knees. This patient's chief complaints consist of knee pain, weakness, poor activity tolerance leading to the following functional deficits: difficulty with ADLs, IADLs, basic mobility and usual activities including difficulty walking dogs, gardening, getting up and down to knees and up, standing in place long enough to check out at grocery store, limited activity tolerance, stairs, walking. Relevant past medical history and comorbidities include raynaud, lumbar and cervical spine pain with disectomies and fusions in the cervical spine (C3-C7  bone graft used), symptomatic with nausea and dizzines when moves c-spine too much, chronic fatigue syndrome (3-4 good hours a day), interstitial cystitis, DDD, scoliosis, bursitis R hip, L THA (august 2020), headaches (1x a week migraines), insomnia, fibromyalgia, reflux, bile duct stones, cholecystectomy, chronic pain, pain medication induced GI problem (managed with medication), atypical biploar disorder (fairly well managed), anxiety, osteopenia, Stargardt's disease, seronegative arhtritis. Patient denies hx of cancer, stroke, seizures, lung problem, major cardiac events, diabetes, unexplained weight loss, changes in bowel or bladder problems, saddle paresthesia, new onset stumbling or dropping things.    Limitations  Standing;Walking;House hold activities;Other (comment)   difficulty with ADLs, IADLs, basic mobility and usual activities including difficulty walking dogs, gardening, getting up and down to knees and up, standing in place long enough to check out at grocery store, limited activity tolerance, stairs,  walking.   How long can you sit comfortably?  other things bothers her in sitting, knees no worse    How long can you stand comfortably?  10 min (not comfortable)    How long can you walk comfortably?  25 min before having to stop (not comfortable).    Diagnostic tests  radiograph of bilateral knees report 09/05/2019: "Mild to moderate medial compartment narrowing was noted.  No chondrocalcinosis was noted.  Severe patellofemoral narrowing was noted. Impression: These findings are consistent with mild to moderate osteoarthritis and severe chondromalacia patella."    Patient Stated Goals  wants to get stronger, feels LE very weak, get a good exercise program, how to manage exercise, times of day and what types of exercise, and make knee pain go away.    Currently in Pain?  Yes    Pain Score  5     Pain Orientation  Right;Left;Anterior    Pain Onset  More than a month ago       OBJECTIVE FOTO = 40 (11/19/2019);  TREATMENT: Significant comorbidities - see subjective  Manual therapy: to reduce pain and tissue tension, improve range of motion, neuromodulation, in order to promote improved ability to complete functional activities. - gentle STM to bilateral quadruceps, hamstrings, and lower legs to improve circulation, decrease tension, improve pain response.  - gentle bilateral patellar mobilizations grades II-III medial and caudal - hooklying grade I-II tibiofemoral and fibular head AP and PA mobilizations for decreased pain.    Therapeutic exercise:to centralize symptoms and improve ROM, strength, muscular endurance, and activity tolerance required for successful completion of functional activities. - education with recommendations to stop LAQ exercise at home for a couple of days, then add it back in at lower rep range to see if that can decrease her symptoms.  - seated isometric knee extension/flexion using contralateral LEs against each other. 4 second holds x15each side.  - Hooklying straight leg raise with quad set, x15 each side - standing hip extension while laying over elevated plinth to simulate her bed at home for a more comfortable way to work glutes and hip extension. X 5 each side.  Pt required multimodal cuing for proper technique and to facilitate improved neuromuscular control, strength, range of motion, and functional ability resulting in improved performance and form.  HOME EXERCISE PROGRAM Access Code: [email or web address] URL: [email or web address] Date: 11/25/2019 Prepared by: Rosita Kea  Program Notes Think of each line as a difficulty level. Your goal is to gradually get to the hardest (the bottom). Work through each difficulty level by performing that many reps for each exercise 2 times a day. Once you are sure you can do a given level without causing worsening in symptoms, move down to the next level. To go to the next  level you want no worse pain during the exercise, the hours following the exercise, or the next day.   1 set of 10 1 set of 15 2 sets of 10 2 sets of 15 3 sets of 10 3 sets of 15   Exercises Seated Isometric Knee Extension - 2 x daily - 4 seconds hold Seated Long Arc Quad - 2 x daily - 4 seconds hold Supine Active Straight Leg Raise - 2 x daily - 2 seconds hold      PT Education - 11/27/19 0936    Education Details  Exercise purpose/form. Self management techniques    Person(s) Educated  Patient    Methods  Explanation;Demonstration;Verbal cues    Comprehension  Verbalized understanding;Returned demonstration;Verbal cues required       PT Short Term Goals - 11/19/19 1547      PT SHORT TERM GOAL #1   Title  Be independent with initial home exercise program for self-management of symptoms.    Baseline  initial HEP provided at IE (11/13/2019);    Time  2    Period  Weeks    Status  Achieved    Target Date  11/27/19        PT Long Term Goals - 11/13/19 2041      PT LONG TERM GOAL #1   Title  Be independent with a long-term home exercise program for self-management of symptoms.    Baseline  initial HEP provided at IE (11/13/2019);    Time  12    Period  Weeks    Status  New    Target Date  02/05/20      PT LONG TERM GOAL #2   Title  Demonstrate improved FOTO score by 10 units to demonstrate improvement in overall condition and self-reported functional ability.    Baseline  deferred to visit 2 (11/13/2019);    Time  12    Period  Weeks    Status  New    Target Date  02/05/20      PT LONG TERM GOAL #3   Title  Improve BLE  strength to 4+/5 for improved ability to allow patient to complete valued functional tasks such as gardening, and walking dogs with less difficulty.    Baseline  see objective exam (11/13/2019);    Time  12    Period  Weeks    Status  New    Target Date  02/05/20      PT LONG TERM GOAL #4   Title  Complete community, work and/or  recreational activities without limitation due to current condition.    Baseline  difficulty with ADLs, IADLs, basic mobility and usual activities including difficulty walking dogs, gardening, getting up and down to knees and up, standing in place long enough to check out at grocery store, limited activity tolerance, stairs, walking.    Time  12    Period  Weeks    Status  New    Target Date  02/05/20      PT LONG TERM GOAL #5   Title  Reduce pain with functional activities to equal or less than 3/10 to allow patient to complete usual activities including ADLs, IADLs, and social engagement with less difficulty.    Baseline  up to 8/10 (11/13/2019);    Time  12    Period  Weeks    Status  New    Target Date  02/05/20            Plan - 11/27/19 2049    Clinical Impression Statement  Patient tolerated treatment well today overall and was able to complete all exercises without increase in pain. Agreed to discontinue LAQ at home for the next couple of days and then add it back slowly as that is the exercise she feels caused increase symptoms when she tried to increase from 10 reps to 15 reps. Appears to be tolerating the other exercises well. Did not progress any exercises today due to high irritability of condition. Provided manual therapy for pain relief. Patient would benefit from continued management of limiting condition by skilled physical therapist to address remaining impairments and functional limitations to work towards stated goals and return to PLOF or maximal  functional independence.    Personal Factors and Comorbidities  Age;Time since onset of injury/illness/exacerbation;Past/Current Experience;Comorbidity 3+;Other   chronicity and severity of multiple comorbid conditions   Comorbidities  Relevant past medical history and comorbidities include raynaud, lumbar and cervical spine pain with disectomies and fusions in the cervical spine (C3-C7 bone graft used), symptomatic with nausea  and dizzines when moves c-spine too much, chronic fatigue syndrome (3-4 good hours a day), interstitial cystitis, DDD, scoliosis, bursitis R hip, L THA (august 2020), headaches (1x a week migraines), insomnia, fibromyalgia, reflux, bile duct stones, cholecystectomy, chronic pain, pain medication induced GI problem (managed with medication), atypical biploar disorder (fairly well managed), anxiety, osteopenia, Stargardt's disease, seronegative arthritis.    Examination-Activity Limitations  Bathing;Bend;Caring for Others;Stand;Stairs;Squat;Dressing;Transfers   difficulty with ADLs, IADLs, basic mobility and usual activities including difficulty walking dogs, gardening, getting up and down to knees and up, standing in place long enough to check out at grocery store, limited activity tolerance, stairs, walking.   Examination-Participation Restrictions  Laundry;Shop;Cleaning;Community Activity;Yard Work;Meal Prep;Other    Stability/Clinical Decision Making  Evolving/Moderate complexity    Rehab Potential  Fair    PT Frequency  2x / week    PT Duration  12 weeks    PT Treatment/Interventions  ADLs/Self Care Home Management;Aquatic Therapy;Biofeedback;Cryotherapy;Electrical Stimulation;Moist Heat;Functional mobility training;Therapeutic activities;Therapeutic exercise;Balance training;Neuromuscular re-education;Patient/family education;Manual techniques;Passive range of motion;Dry needling;Energy conservation;Joint Manipulations    PT Next Visit Plan  Assess response to HEP, progressive LE strengthening as tolerated    PT Home Exercise Plan  Medbridge Access Code: [email or web address]    Consulted and Agree with Plan of Care  Patient       Patient will benefit from skilled therapeutic intervention in order to improve the following deficits and impairments:  Dizziness, Pain, Postural dysfunction, Decreased mobility, Decreased activity tolerance, Decreased endurance, Decreased range of motion, Decreased strength,  Impaired perceived functional ability, Impaired flexibility, Difficulty walking, Decreased balance, Hypomobility, Increased muscle spasms  Visit Diagnosis: Chronic pain of right knee  Chronic pain of left knee  Difficulty in walking, not elsewhere classified  History of falling     Problem List Patient Active Problem List   Diagnosis Date Noted  . Sore throat 10/02/2019  . Positive ANA (antinuclear antibody) 06/05/2019  . 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  . Unilateral primary osteoarthritis, left hip 01/28/2019  . Pelvic pain 12/12/2018  . Common bile duct (CBD) obstruction   . Elevated alkaline phosphatase level   . Venous insufficiency of left lower extremity 07/04/2018  . Dyslipidemia 09/22/2017  . DDD (degenerative disc disease), cervical 04/03/2017  . DDD (degenerative disc disease), lumbar 04/03/2017  . Onychomycosis 03/21/2017  . Dysphagia 10/18/2016  . Encounter for chronic pain management 10/18/2016  . Biliary stasis 09/26/2016  . Fever blister 08/18/2016  . Urinary urgency 07/05/2016  . HNP (herniated nucleus pulposus), lumbar 09/23/2015  . Sinus congestion 07/30/2015  . Iron deficiency 07/30/2015  . Health maintenance examination 06/15/2015  . 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 Headache 03/09/2015  . Clavicle enlargement 12/13/2014  . Prediabetes 12/13/2014  . Plantar fasciitis, bilateral 09/11/2014  . Advanced care planning/counseling discussion 06/10/2014  . Medicare annual wellness visit, subsequent 06/10/2014  . Abnormal thyroid function test 06/10/2014  . Chronic pain syndrome 06/10/2014  . CFS (chronic fatigue syndrome) 04/08/2014  . Postmenopausal atrophic vaginitis 10/19/2012  . 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 Cervical disc disorder with radiculopathy of cervical region 05/28/2010  . CHEST PAIN UNSPECIFIED 02/28/2008  . APHTHOUS ULCERS  01/31/2008  . Chronic insomnia 11/09/2007  . Drug-induced constipation  10/11/2007  . Allergic rhinitis 04/12/2007  . MDD (major depressive disorder), recurrent episode, moderate ([place]) 03/05/2007  . [name] syndrome 03/05/2007  . GERD 03/05/2007  . ROSACEA 03/05/2007  . NEURALGIA 03/05/2007  . 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 Chronic interstitial cystitis 12/06/2006  . Fibromyalgia 12/06/2006   Everlean Alstrom. Graylon Good, PT, DPT 11/27/19, 8:49 PM  [facility] [address], [place], [address] Phone: [phone]   Fax:  [phone]  Name: Rachel Duran MRN: [id number] Date of Birth: [birth year]

## 2019-11-27 NOTE — Telephone Encounter (Signed)
ERx 

## 2019-12-04 ENCOUNTER — Encounter: Payer: Self-pay | Admitting: Physical Therapy

## 2019-12-04 ENCOUNTER — Ambulatory Visit: Payer: Medicare Other | Admitting: Physical Therapy

## 2019-12-04 ENCOUNTER — Other Ambulatory Visit: Payer: Self-pay

## 2019-12-04 DIAGNOSIS — M25561 Pain in right knee: Secondary | ICD-10-CM | POA: Diagnosis not present

## 2019-12-04 DIAGNOSIS — R262 Difficulty in walking, not elsewhere classified: Secondary | ICD-10-CM

## 2019-12-04 DIAGNOSIS — Z9181 History of falling: Secondary | ICD-10-CM

## 2019-12-04 DIAGNOSIS — G8929 Other chronic pain: Secondary | ICD-10-CM

## 2019-12-04 DIAGNOSIS — M25562 Pain in left knee: Secondary | ICD-10-CM

## 2019-12-04 NOTE — Therapy (Signed)
[facility] [address], [place], [address] Phone: [phone]   Fax:  [phone]  Physical Therapy Treatment  Patient Details  Name: Rachel Duran MRN: [id number] Date of Birth: 01/17/1956 Referring Provider (PT): Bo Merino, MD   Encounter Date: 12/04/2019  PT End of Session - 12/04/19 1306    Visit Number  6    Number of Visits  24    Date for PT Re-Evaluation  02/05/20    Authorization Type  [employer] reporting period from 11/13/2019    Authorization Time Period  n/a    Authorization - Visit Number  6    Authorization - Number of Visits  10    Progress Note Due on Visit  10   Next FOTO due 4/21   PT Start Time  0950    PT Stop Time  1030    PT Time Calculation (min)  40 min    Activity Tolerance  Patient tolerated treatment well;No increased pain    Behavior During Therapy  WFL for tasks assessed/performed       Past Medical History:  Diagnosis Date  . Abdominal pain last 4 months   and nausea also  . Allergy   . Anemia    history of  . Anxiety   . Bipolar disorder ([place])    atpical bipolar disorder  . 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 C6- C7 -hx. past fusion(bone graft used)  . Cholecystitis   . Chronic pain   . DDD (degenerative disc disease), lumbar 09/2015   dextroscoliosis with multilevel DDD and facet arthrosis most notable for R foraminal disc protrusion L4/5 producing severe R neural foraminal stenosis abutting R L4 nerve root, moderate spinal canal and mild lat recesss and R neural foraminal stenosis L3/4 (MRI)  . Depression    bipolar depression  . Disorders of porphyrin metabolism   . Felon of finger of left hand 11/10/2016  . Fibromyalgia   . GERD (gastroesophageal reflux disease)   . Headache    occasionally  . Internal hemorrhoids   . Interstitial cystitis 06-06-12   hx.  . Irritable bowel syndrome   . PONV (postoperative nausea and  vomiting)    now uses stomach blockers and no ponv  . 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 Raynauds disease    hx.  . Seronegative arthritis    [name]  . 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 ANTERIOR CERVICAL DECOMP/DISCECTOMY FUSION  2004   C5/6, C6/7  . ANTERIOR CERVICAL DECOMP/DISCECTOMY FUSION  02/2016   C3/4, C4/5 with plating [name])  . AUGMENTATION MAMMAPLASTY Bilateral 03/25/2010  . BREAST ENHANCEMENT SURGERY  2010  . BREAST IMPLANT EXCHANGE  10/2014   exchange saline implants, B mastopexy/capsulorraphy ([facility])  . BUNIONECTOMY Bilateral yrs ago  . [place]   x 1  . CHOLECYSTECTOMY  06/11/2012   Procedure: LAPAROSCOPIC CHOLECYSTECTOMY WITH INTRAOPERATIVE CHOLANGIOGRAM;  Surgeon: [name], MD,FACS;  Location: [facility];  Service: General;  Laterality: N/A;  . COLONOSCOPY  02/2018   done for positive cologuard - WNL, rpt 10 yrs Fuller Plan)  . CYSTOSCOPY    . ERCP  05/22/2012   Procedure: ENDOSCOPIC RETROGRADE CHOLANGIOPANCREATOGRAPHY (ERCP);  Surgeon: [name], MD,FACG;  Location: [facility];  Service: Endoscopy;  Laterality: N/A;  . ERCP N/A 09/17/2013   Procedure: ENDOSCOPIC RETROGRADE CHOLANGIOPANCREATOGRAPHY (ERCP);  Surgeon: [name], MD;  Location: [facility];  Service: Endoscopy;  Laterality: N/A;  . ERCP N/A 09/27/2018   Procedure: ENDOSCOPIC RETROGRADE CHOLANGIOPANCREATOGRAPHY (ERCP);  Surgeon: [name], MD;  Location: [facility];  Service: Endoscopy;  Laterality: N/A;  . ESOPHAGOGASTRODUODENOSCOPY  09/2016   WNL. esophagus dilated Fuller Plan)  . HEMORRHOID BANDING  09-23-13   --Dr. Greer Pickerel  . HERNIA REPAIR     inguinal  . HYSTEROSCOPY W/ ENDOMETRIAL ABLATION    .  NASAL SINUS SURGERY     x5  . REMOVAL OF STONES  09/27/2018   Procedure: REMOVAL OF STONES;  Surgeon: [name], MD;  Location: [facility];  Service: Endoscopy;;  . [name]  09/27/2018   Procedure: SPHINCTEROTOMY;  Surgeon: [name], MD;  Location: [facility];  Service: Endoscopy;;  . TOTAL HIP ARTHROPLASTY Left 03/12/2019   Procedure: LEFT TOTAL HIP ARTHROPLASTY ANTERIOR APPROACH;  [name], [name], MD)  . UPPER GASTROINTESTINAL ENDOSCOPY      There were no vitals filed for this visit.  Subjective Assessment - 12/04/19 0951    Subjective  Patient report she feels like she is doing about the same. She feels pretty stiff/painful in the morning and it gets better as she moves around but by 3pm she really has burning and must stop and put heat on her knees, etc. She is really needing to manage her time. She has been doing her exercises and still thinks the long arc quad is causing problems (she added 5 at a time). She think she may be able to go to 20 rep soon on some of the exercises. She feels like the other exercises are helping her legs get stronger and she doen't think her pain is from doing those exercises.    Pertinent History  Patient is a 64 y.o. female who presents to outpatient physical therapy with a referral for medical diagnosis primary osteoarthritis of both knees, chronic pain of both knees, chondromalacia patella - bilateral knees. This patient's chief complaints consist of knee pain, weakness, poor activity tolerance leading to the following functional deficits: difficulty with ADLs, IADLs, basic mobility and usual activities including difficulty walking dogs, gardening, getting up and down to knees and up, standing in place long enough to check out at grocery store, limited activity tolerance, stairs, walking. Relevant past medical history and comorbidities include raynaud, lumbar and cervical spine pain with disectomies and fusions in the cervical spine  (C3-C7 bone graft used), symptomatic with nausea and dizzines when moves c-spine too much, chronic fatigue syndrome (3-4 good hours a day), interstitial cystitis, DDD, scoliosis, bursitis R hip, L THA (august 2020), headaches (1x a week migraines), insomnia, fibromyalgia, reflux, bile duct stones, cholecystectomy, chronic pain, pain medication induced GI problem (managed with medication), atypical biploar disorder (fairly well managed), anxiety, osteopenia, Stargardt's disease, seronegative arhtritis. Patient denies hx of cancer, stroke, seizures, lung problem, major cardiac events, diabetes, unexplained weight loss, changes in bowel or bladder problems, saddle paresthesia, new onset stumbling or dropping things.    Limitations  Standing;Walking;House hold activities;Other (comment)   difficulty with ADLs, IADLs, basic mobility and usual activities including difficulty walking dogs, gardening, getting up and down to knees and up, standing in place long enough to check out at grocery store, limited  activity tolerance, stairs, walking.   How long can you sit comfortably?  other things bothers her in sitting, knees no worse    How long can you stand comfortably?  10 min (not comfortable)    How long can you walk comfortably?  25 min before having to stop (not comfortable).    Diagnostic tests  radiograph of bilateral knees report 09/05/2019: "Mild to moderate medial compartment narrowing was noted.  No chondrocalcinosis was noted.  Severe patellofemoral narrowing was noted. Impression: These findings are consistent with mild to moderate osteoarthritis and severe chondromalacia patella."    Patient Stated Goals  wants to get stronger, feels LE very weak, get a good exercise program, how to manage exercise, times of day and what types of exercise, and make knee pain go away.    Currently in Pain?  Yes    Pain Score  6     Pain Location  Knee    Pain Orientation  Right;Left;Anterior    Pain Descriptors /  Indicators  Burning    Pain Onset  More than a month ago       OBJECTIVE FOTO = 40 (11/19/2019);  TREATMENT: Significant comorbidities - see subjective  Therapeutic exercise:to centralize symptoms and improve ROM, strength, muscular endurance, and activity tolerance required for successful completion of functional activities. - education with recommendations to stop LAQ exercise at home completely with the option to add isometrics at 45 degrees flexion -seated isometric knee extension/flexion using contralateral LEs against each other. 4 second holds 2x10each side.  - Hooklying straight leg raise with quad set, 2x10each side - standing hip extension while laying over elevated plinth to simulate her bed at home for a more comfortable way to work glutes and hip extension. 2x10 each side.  Manual therapy: to reduce pain and tissue tension, improve range of motion, neuromodulation, in order to promote improved ability to complete functional activities. - sidelying passive hip flexor stretch, 3x45 seconds each side. Pillow placed under left hip for comfort.   Pt required multimodal cuing for proper technique and to facilitate improved neuromuscular control, strength, range of motion, and functional ability resulting in improved performance and form.  HOME EXERCISE PROGRAM Access Code: [email or web address] URL: [email or web address] Date: 11/25/2019 Prepared by: Rosita Kea  Program Notes Think of each line as a difficulty level. Your goal is to gradually get to the hardest (the bottom). Work through each difficulty level by performing that many reps for each exercise 2 times a day. Once you are sure you can do a given level without causing worsening in symptoms, move down to the next level. To go to the next level you want no worse pain during the exercise, the hours following the exercise, or the next day.   1 set of 10 1 set of 15 2 sets of 10 2 sets of 15 3 sets of  10 3 sets of 15   Exercises Seated Isometric Knee Extension - 2 x daily - 4 seconds hold Seated Long Arc Quad - 2 x daily - 4 seconds hold Supine Active Straight Leg Raise - 2 x daily - 2 seconds hold     PT Education - 12/04/19 1304    Education Details  Exercise purpose/form. Self management techniques    Person(s) Educated  Patient    Methods  Explanation;Demonstration;Verbal cues    Comprehension  Verbalized understanding;Returned demonstration;Verbal cues required;Need further instruction       PT Short Term Goals - 11/19/19 1547  PT SHORT TERM GOAL #1   Title  Be independent with initial home exercise program for self-management of symptoms.    Baseline  initial HEP provided at IE (11/13/2019);    Time  2    Period  Weeks    Status  Achieved    Target Date  11/27/19        PT Long Term Goals - 11/13/19 2041      PT LONG TERM GOAL #1   Title  Be independent with a long-term home exercise program for self-management of symptoms.    Baseline  initial HEP provided at IE (11/13/2019);    Time  12    Period  Weeks    Status  New    Target Date  02/05/20      PT LONG TERM GOAL #2   Title  Demonstrate improved FOTO score by 10 units to demonstrate improvement in overall condition and self-reported functional ability.    Baseline  deferred to visit 2 (11/13/2019);    Time  12    Period  Weeks    Status  New    Target Date  02/05/20      PT LONG TERM GOAL #3   Title  Improve BLE  strength to 4+/5 for improved ability to allow patient to complete valued functional tasks such as gardening, and walking dogs with less difficulty.    Baseline  see objective exam (11/13/2019);    Time  12    Period  Weeks    Status  New    Target Date  02/05/20      PT LONG TERM GOAL #4   Title  Complete community, work and/or recreational activities without limitation due to current condition.    Baseline  difficulty with ADLs, IADLs, basic mobility and usual activities including  difficulty walking dogs, gardening, getting up and down to knees and up, standing in place long enough to check out at grocery store, limited activity tolerance, stairs, walking.    Time  12    Period  Weeks    Status  New    Target Date  02/05/20      PT LONG TERM GOAL #5   Title  Reduce pain with functional activities to equal or less than 3/10 to allow patient to complete usual activities including ADLs, IADLs, and social engagement with less difficulty.    Baseline  up to 8/10 (11/13/2019);    Time  12    Period  Weeks    Status  New    Target Date  02/05/20            Plan - 12/04/19 1309    Clinical Impression Statement  Patient tolerated treatment well overall today including sidelying on left hip and quad/hip flexor stretch during session. Increased exercises to 2x10 of each this morning and 1x15 this evening and removed LAQ all together that seems to be irritating symptoms. Patient continues to be hypersensitive to pain and with reduced activity tolerance capacity. Patient would benefit from continued management of limiting condition by skilled physical therapist to address remaining impairments and functional limitations to work towards stated goals and return to PLOF or maximal functional independence.    Personal Factors and Comorbidities  Age;Time since onset of injury/illness/exacerbation;Past/Current Experience;Comorbidity 3+;Other   chronicity and severity of multiple comorbid conditions   Comorbidities  Relevant past medical history and comorbidities include raynaud, lumbar and cervical spine pain with disectomies and fusions in the cervical spine (C3-C7  bone graft used), symptomatic with nausea and dizzines when moves c-spine too much, chronic fatigue syndrome (3-4 good hours a day), interstitial cystitis, DDD, scoliosis, bursitis R hip, L THA (august 2020), headaches (1x a week migraines), insomnia, fibromyalgia, reflux, bile duct stones, cholecystectomy, chronic pain, pain  medication induced GI problem (managed with medication), atypical biploar disorder (fairly well managed), anxiety, osteopenia, Stargardt's disease, seronegative arthritis.    Examination-Activity Limitations  Bathing;Bend;Caring for Others;Stand;Stairs;Squat;Dressing;Transfers   difficulty with ADLs, IADLs, basic mobility and usual activities including difficulty walking dogs, gardening, getting up and down to knees and up, standing in place long enough to check out at grocery store, limited activity tolerance, stairs, walking.   Examination-Participation Restrictions  Laundry;Shop;Cleaning;Community Activity;Yard Work;Meal Prep;Other    Stability/Clinical Decision Making  Evolving/Moderate complexity    Rehab Potential  Fair    PT Frequency  2x / week    PT Duration  12 weeks    PT Treatment/Interventions  ADLs/Self Care Home Management;Aquatic Therapy;Biofeedback;Cryotherapy;Electrical Stimulation;Moist Heat;Functional mobility training;Therapeutic activities;Therapeutic exercise;Balance training;Neuromuscular re-education;Patient/family education;Manual techniques;Passive range of motion;Dry needling;Energy conservation;Joint Manipulations    PT Next Visit Plan  Assess response to HEP, progressive LE strengthening as tolerated    PT Home Exercise Plan  Medbridge Access Code: [email or web address]    Consulted and Agree with Plan of Care  Patient       Patient will benefit from skilled therapeutic intervention in order to improve the following deficits and impairments:  Dizziness, Pain, Postural dysfunction, Decreased mobility, Decreased activity tolerance, Decreased endurance, Decreased range of motion, Decreased strength, Impaired perceived functional ability, Impaired flexibility, Difficulty walking, Decreased balance, Hypomobility, Increased muscle spasms  Visit Diagnosis: Chronic pain of right knee  Chronic pain of left knee  Difficulty in walking, not elsewhere classified  History of  falling     Problem List Patient Active Problem List   Diagnosis Date Noted  . Sore throat 10/02/2019  . Positive ANA (antinuclear antibody) 06/05/2019  . 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  . Unilateral primary osteoarthritis, left hip 01/28/2019  . Pelvic pain 12/12/2018  . Common bile duct (CBD) obstruction   . Elevated alkaline phosphatase level   . Venous insufficiency of left lower extremity 07/04/2018  . Dyslipidemia 09/22/2017  . DDD (degenerative disc disease), cervical 04/03/2017  . DDD (degenerative disc disease), lumbar 04/03/2017  . Onychomycosis 03/21/2017  . Dysphagia 10/18/2016  . Encounter for chronic pain management 10/18/2016  . Biliary stasis 09/26/2016  . Fever blister 08/18/2016  . Urinary urgency 07/05/2016  . HNP (herniated nucleus pulposus), lumbar 09/23/2015  . Sinus congestion 07/30/2015  . Iron deficiency 07/30/2015  . Health maintenance examination 06/15/2015  . 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 Headache 03/09/2015  . Clavicle enlargement 12/13/2014  . Prediabetes 12/13/2014  . Plantar fasciitis, bilateral 09/11/2014  . Advanced care planning/counseling discussion 06/10/2014  . Medicare annual wellness visit, subsequent 06/10/2014  . Abnormal thyroid function test 06/10/2014  . Chronic pain syndrome 06/10/2014  . CFS (chronic fatigue syndrome) 04/08/2014  . Postmenopausal atrophic vaginitis 10/19/2012  . 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 Cervical disc disorder with radiculopathy of cervical region 05/28/2010  . CHEST PAIN UNSPECIFIED 02/28/2008  . APHTHOUS ULCERS 01/31/2008  . Chronic insomnia 11/09/2007  . Drug-induced constipation 10/11/2007  . Allergic rhinitis 04/12/2007  . MDD (major depressive disorder), recurrent episode, moderate ([place]) 03/05/2007  . [name] syndrome 03/05/2007  . GERD 03/05/2007  . ROSACEA 03/05/2007  . NEURALGIA 03/05/2007  . 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  Chronic interstitial cystitis 12/06/2006  . Fibromyalgia 12/06/2006    Everlean Alstrom. Graylon Good, PT, DPT 12/04/19, 1:10 PM  [facility] [address] [place], [address] Phone: [phone]   Fax:  [phone]  Name: Margaruite Weidler MRN: [id number] Date of Birth: 1955/11/17

## 2019-12-05 ENCOUNTER — Ambulatory Visit: Payer: Medicare Other | Admitting: Physical Therapy

## 2019-12-09 ENCOUNTER — Other Ambulatory Visit: Payer: Self-pay | Admitting: Physician Assistant

## 2019-12-09 ENCOUNTER — Encounter: Payer: Self-pay | Admitting: Physical Therapy

## 2019-12-09 ENCOUNTER — Ambulatory Visit: Payer: Medicare Other | Admitting: Physical Therapy

## 2019-12-09 ENCOUNTER — Other Ambulatory Visit: Payer: Self-pay | Admitting: Family Medicine

## 2019-12-09 ENCOUNTER — Other Ambulatory Visit: Payer: Self-pay

## 2019-12-09 DIAGNOSIS — G8929 Other chronic pain: Secondary | ICD-10-CM

## 2019-12-09 DIAGNOSIS — R262 Difficulty in walking, not elsewhere classified: Secondary | ICD-10-CM

## 2019-12-09 DIAGNOSIS — M25562 Pain in left knee: Secondary | ICD-10-CM

## 2019-12-09 DIAGNOSIS — M25561 Pain in right knee: Secondary | ICD-10-CM | POA: Diagnosis not present

## 2019-12-09 DIAGNOSIS — Z9181 History of falling: Secondary | ICD-10-CM

## 2019-12-09 NOTE — Telephone Encounter (Signed)
Last Visit:09/05/19 Next Visit:03/04/20  Last Fill: 06/24/2019  Okay to refill Lyrica?

## 2019-12-09 NOTE — Telephone Encounter (Signed)
Plz address in Dr. Synthia Innocent absence.  Name of Medication: Adderall XR Name of Pharmacy: [facility] or Written Date and Quantity: 09/09/19, #90 Last Office Visit and Type: 11/04/19, chronic pain mngmt Next Office Visit and Type: 02/03/20, 3 mo chronic pain mngmt Last Controlled Substance Agreement Date: 04/19/19 Last UDS: 04/19/19

## 2019-12-09 NOTE — Therapy (Signed)
[facility] [address], [place], [address] Phone: [phone]   Fax:  [phone]  Physical Therapy Treatment  Patient Details  Name: Rachel Duran MRN: [id number] Date of Birth: April 01, 1956 Referring Provider (PT): Bo Merino, MD   Encounter Date: 12/09/2019  PT End of Session - 12/09/19 1027    Visit Number  7    Number of Visits  24    Date for PT Re-Evaluation  02/05/20    Authorization Type  [employer] reporting period from 11/13/2019    Authorization Time Period  n/a    Authorization - Visit Number  7    Authorization - Number of Visits  10    Progress Note Due on Visit  10   Next FOTO due 4/21   PT Start Time  1030    PT Stop Time  1110    PT Time Calculation (min)  40 min    Activity Tolerance  Patient tolerated treatment well;No increased pain    Behavior During Therapy  WFL for tasks assessed/performed       Past Medical History:  Diagnosis Date  . Abdominal pain last 4 months   and nausea also  . Allergy   . Anemia    history of  . Anxiety   . Bipolar disorder ([place])    atpical bipolar disorder  . 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 C6- C7 -hx. past fusion(bone graft used)  . Cholecystitis   . Chronic pain   . DDD (degenerative disc disease), lumbar 09/2015   dextroscoliosis with multilevel DDD and facet arthrosis most notable for R foraminal disc protrusion L4/5 producing severe R neural foraminal stenosis abutting R L4 nerve root, moderate spinal canal and mild lat recesss and R neural foraminal stenosis L3/4 (MRI)  . Depression    bipolar depression  . Disorders of porphyrin metabolism   . [place] of finger of left hand 11/10/2016  . Fibromyalgia   . GERD (gastroesophageal reflux disease)   . Headache    occasionally  . Internal hemorrhoids   . Interstitial cystitis 06-06-12   hx.  . Irritable bowel syndrome   . PONV (postoperative nausea and  vomiting)    now uses stomach blockers and no ponv  . 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 Raynauds disease    hx.  . Seronegative arthritis    [name]  . 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 ANTERIOR CERVICAL DECOMP/DISCECTOMY FUSION  2004   C5/6, C6/7  . ANTERIOR CERVICAL DECOMP/DISCECTOMY FUSION  02/2016   C3/4, C4/5 with plating [name])  . AUGMENTATION MAMMAPLASTY Bilateral 03/25/2010  . BREAST ENHANCEMENT SURGERY  2010  . BREAST IMPLANT EXCHANGE  10/2014   exchange saline implants, B mastopexy/capsulorraphy ([facility])  . BUNIONECTOMY Bilateral yrs ago  . [place]   x 1  . CHOLECYSTECTOMY  06/11/2012   Procedure: LAPAROSCOPIC CHOLECYSTECTOMY WITH INTRAOPERATIVE CHOLANGIOGRAM;  Surgeon: [name], MD,FACS;  Location: [facility];  Service: General;  Laterality: N/A;  . COLONOSCOPY  02/2018   done for positive cologuard - WNL, rpt 10 yrs Fuller Plan)  . CYSTOSCOPY    . ERCP  05/22/2012   Procedure: ENDOSCOPIC RETROGRADE CHOLANGIOPANCREATOGRAPHY (ERCP);  Surgeon: [name], MD,FACG;  Location: [facility];  Service: Endoscopy;  Laterality: N/A;  . ERCP N/A 09/17/2013   Procedure: ENDOSCOPIC RETROGRADE CHOLANGIOPANCREATOGRAPHY (ERCP);  Surgeon: [name], MD;  Location: [facility];  Service: Endoscopy;  Laterality: N/A;  . ERCP N/A 09/27/2018   Procedure: ENDOSCOPIC RETROGRADE CHOLANGIOPANCREATOGRAPHY (ERCP);  Surgeon: [name], MD;  Location: [facility];  Service: Endoscopy;  Laterality: N/A;  . ESOPHAGOGASTRODUODENOSCOPY  09/2016   WNL. esophagus dilated Fuller Plan)  . HEMORRHOID BANDING  09-23-13   --Dr. Greer Pickerel  . HERNIA REPAIR     inguinal  . HYSTEROSCOPY W/ ENDOMETRIAL ABLATION    .  NASAL SINUS SURGERY     x5  . REMOVAL OF STONES  09/27/2018   Procedure: REMOVAL OF STONES;  Surgeon: [name], MD;  Location: [facility];  Service: Endoscopy;;  . [name]  09/27/2018   Procedure: SPHINCTEROTOMY;  Surgeon: [name], MD;  Location: [facility];  Service: Endoscopy;;  . TOTAL HIP ARTHROPLASTY Left 03/12/2019   Procedure: LEFT TOTAL HIP ARTHROPLASTY ANTERIOR APPROACH;  [name], [name], MD)  . UPPER GASTROINTESTINAL ENDOSCOPY      There were no vitals filed for this visit.  Subjective Assessment - 12/09/19 1030    Subjective  Patient reports her pain upon arrival is "not too bad right now" rated about 5/10. She was able to go to [facility] in [place] on Saturday for several hours and did okay as long as she took rests. She did have knee pain. She did not get to go on her trip because of the gas shortage. It has been postponed to June 7th. She thinks the passive stretch last session was helpful.    Pertinent History  Patient is a 64 y.o. female who presents to outpatient physical therapy with a referral for medical diagnosis primary osteoarthritis of both knees, chronic pain of both knees, chondromalacia patella - bilateral knees. This patient's chief complaints consist of knee pain, weakness, poor activity tolerance leading to the following functional deficits: difficulty with ADLs, IADLs, basic mobility and usual activities including difficulty walking dogs, gardening, getting up and down to knees and up, standing in place long enough to check out at grocery store, limited activity tolerance, stairs, walking. Relevant past medical history and comorbidities include raynaud, lumbar and cervical spine pain with disectomies and fusions in the cervical spine (C3-C7 bone graft used), symptomatic with nausea and dizzines when moves c-spine too much, chronic fatigue syndrome (3-4 good hours a day), interstitial cystitis, DDD, scoliosis, bursitis R hip, L THA  (august 2020), headaches (1x a week migraines), insomnia, fibromyalgia, reflux, bile duct stones, cholecystectomy, chronic pain, pain medication induced GI problem (managed with medication), atypical biploar disorder (fairly well managed), anxiety, osteopenia, Stargardt's disease, seronegative arhtritis. Patient denies hx of cancer, stroke, seizures, lung problem, major cardiac events, diabetes, unexplained weight loss, changes in bowel or bladder problems, saddle paresthesia, new onset stumbling or dropping things.    Limitations  Standing;Walking;House hold activities;Other (comment)   difficulty with ADLs, IADLs, basic mobility and usual activities including difficulty walking dogs, gardening, getting up and down to knees and up, standing in place long enough to check out at grocery store, limited activity tolerance, stairs, walking.   How long can you sit comfortably?  other things bothers her in sitting, knees no worse    How long can you stand comfortably?  10 min (not comfortable)  How long can you walk comfortably?  25 min before having to stop (not comfortable).    Diagnostic tests  radiograph of bilateral knees report 09/05/2019: "Mild to moderate medial compartment narrowing was noted.  No chondrocalcinosis was noted.  Severe patellofemoral narrowing was noted. Impression: These findings are consistent with mild to moderate osteoarthritis and severe chondromalacia patella."    Patient Stated Goals  wants to get stronger, feels LE very weak, get a good exercise program, how to manage exercise, times of day and what types of exercise, and make knee pain go away.    Currently in Pain?  Yes    Pain Score  5     Pain Location  Knee    Pain Orientation  Right;Left;Anterior    Pain Descriptors / Indicators  Burning    Pain Onset  More than a month ago      OBJECTIVE  FOTO = 40 (11/19/2019);   TREATMENT:  Significant comorbidities - see subjective   Therapeutic exercise: to centralize  symptoms and improve ROM, strength, muscular endurance, and activity tolerance required for successful completion of functional activities.  - sidelying clam shell using self-palpation at iliac crest to isolate hip movement, x 10 each side (pillow under supporting hip for comfort). Added to HEP for 10 reps once a day.  - seated isometric knee extension/flexion using contralateral LEs against each other. 4 second holds 2x 10 each side.  - Hooklying straight leg raise with quad set, 2x10 each side  - standing hip extension while laying over elevated plinth to simulate her bed at home for a more comfortable way to work glutes and hip extension. 2x10 each side.   Manual therapy: to reduce pain and tissue tension, improve range of motion, neuromodulation, in order to promote improved ability to complete functional activities.  - sidelying passive hip flexor stretch, 3x45 seconds each side. Pillow placed under left hip for comfort.  - STM to B distal quads and lateral thigh.  - B patellar mobilizations all directions grade II-IV to decrease pain and improve mobility.   Pt required multimodal cuing for proper technique and to facilitate improved neuromuscular control, strength, range of motion, and functional ability resulting in improved performance and form.   HOME EXERCISE PROGRAM  Access Code: [email or web address]  URL: [email or web address]  Date: 11/25/2019  Prepared by: Rosita Kea   Program Notes  Think of each line as a difficulty level. Your goal is to gradually get to the hardest (the bottom). Work through each difficulty level by performing that many reps for each exercise 2 times a day. Once you are sure you can do a given level without causing worsening in symptoms, move down to the next level. To go to the next level you want no worse pain during the exercise, the hours following the exercise, or the next day.   1 set of 10  1 set of 15  2 sets of 10  2 sets of 15  3 sets of 10   3 sets of 15   Exercises   Seated Isometric Knee Extension - 2 x daily - 4 seconds hold   Seated Long Arc Quad - 2 x daily - 4 seconds hold   Supine Active Straight Leg Raise - 2 x daily - 2 seconds hold   Clam Shells in Side Lying - 1-2 x daily - 2 second hold     PT Education - 12/09/19 1027    Education Details  Exercise purpose/form. Self management techniques  Person(s) Educated  Patient    Methods  Explanation;Demonstration;Tactile cues;Verbal cues    Comprehension  Verbalized understanding;Returned demonstration;Verbal cues required;Tactile cues required;Need further instruction       PT Short Term Goals - 11/19/19 1547      PT SHORT TERM GOAL #1   Title  Be independent with initial home exercise program for self-management of symptoms.    Baseline  initial HEP provided at IE (11/13/2019);    Time  2    Period  Weeks    Status  Achieved    Target Date  11/27/19        PT Long Term Goals - 11/13/19 2041      PT LONG TERM GOAL #1   Title  Be independent with a long-term home exercise program for self-management of symptoms.    Baseline  initial HEP provided at IE (11/13/2019);    Time  12    Period  Weeks    Status  New    Target Date  02/05/20      PT LONG TERM GOAL #2   Title  Demonstrate improved FOTO score by 10 units to demonstrate improvement in overall condition and self-reported functional ability.    Baseline  deferred to visit 2 (11/13/2019);    Time  12    Period  Weeks    Status  New    Target Date  02/05/20      PT LONG TERM GOAL #3   Title  Improve BLE  strength to 4+/5 for improved ability to allow patient to complete valued functional tasks such as gardening, and walking dogs with less difficulty.    Baseline  see objective exam (11/13/2019);    Time  12    Period  Weeks    Status  New    Target Date  02/05/20      PT LONG TERM GOAL #4   Title  Complete community, work and/or recreational activities without limitation due to current  condition.    Baseline  difficulty with ADLs, IADLs, basic mobility and usual activities including difficulty walking dogs, gardening, getting up and down to knees and up, standing in place long enough to check out at grocery store, limited activity tolerance, stairs, walking.    Time  12    Period  Weeks    Status  New    Target Date  02/05/20      PT LONG TERM GOAL #5   Title  Reduce pain with functional activities to equal or less than 3/10 to allow patient to complete usual activities including ADLs, IADLs, and social engagement with less difficulty.    Baseline  up to 8/10 (11/13/2019);    Time  12    Period  Weeks    Status  New    Target Date  02/05/20            Plan - 12/09/19 1114    Clinical Impression Statement  Patient tolerated treatment well and was surprised to find she had been doing clam shells with too much movement of the pelvis in the past. She was able to complete clam shells without any increase in pain and tolerated manual well, stating it felt good. She appears to be doing well with gradually increasing repetitions after taking out long arc quad. Patient would benefit from continued management of limiting condition by skilled physical therapist to address remaining impairments and functional limitations to work towards stated goals and return to PLOF or maximal  functional independence.    Personal Factors and Comorbidities  Age;Time since onset of injury/illness/exacerbation;Past/Current Experience;Comorbidity 3+;Other   chronicity and severity of multiple comorbid conditions   Comorbidities  Relevant past medical history and comorbidities include raynaud, lumbar and cervical spine pain with disectomies and fusions in the cervical spine (C3-C7 bone graft used), symptomatic with nausea and dizzines when moves c-spine too much, chronic fatigue syndrome (3-4 good hours a day), interstitial cystitis, DDD, scoliosis, bursitis R hip, L THA (august 2020), headaches (1x a  week migraines), insomnia, fibromyalgia, reflux, bile duct stones, cholecystectomy, chronic pain, pain medication induced GI problem (managed with medication), atypical biploar disorder (fairly well managed), anxiety, osteopenia, Stargardt's disease, seronegative arthritis.    Examination-Activity Limitations  Bathing;Bend;Caring for Others;Stand;Stairs;Squat;Dressing;Transfers   difficulty with ADLs, IADLs, basic mobility and usual activities including difficulty walking dogs, gardening, getting up and down to knees and up, standing in place long enough to check out at grocery store, limited activity tolerance, stairs, walking.   Examination-Participation Restrictions  Laundry;Shop;Cleaning;Community Activity;Yard Work;Meal Prep;Other    Stability/Clinical Decision Making  Evolving/Moderate complexity    Rehab Potential  Fair    PT Frequency  2x / week    PT Duration  12 weeks    PT Treatment/Interventions  ADLs/Self Care Home Management;Aquatic Therapy;Biofeedback;Cryotherapy;Electrical Stimulation;Moist Heat;Functional mobility training;Therapeutic activities;Therapeutic exercise;Balance training;Neuromuscular re-education;Patient/family education;Manual techniques;Passive range of motion;Dry needling;Energy conservation;Joint Manipulations    PT Next Visit Plan  Assess response to HEP, progressive LE strengthening as tolerated    PT Home Exercise Plan  Medbridge Access Code: [email or web address]    Consulted and Agree with Plan of Care  Patient       Patient will benefit from skilled therapeutic intervention in order to improve the following deficits and impairments:  Dizziness, Pain, Postural dysfunction, Decreased mobility, Decreased activity tolerance, Decreased endurance, Decreased range of motion, Decreased strength, Impaired perceived functional ability, Impaired flexibility, Difficulty walking, Decreased balance, Hypomobility, Increased muscle spasms  Visit Diagnosis: Chronic pain of right  knee  Chronic pain of left knee  Difficulty in walking, not elsewhere classified  History of falling     Problem List Patient Active Problem List   Diagnosis Date Noted  . Sore throat 10/02/2019  . Positive ANA (antinuclear antibody) 06/05/2019  . 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  . Unilateral primary osteoarthritis, left hip 01/28/2019  . Pelvic pain 12/12/2018  . Common bile duct (CBD) obstruction   . Elevated alkaline phosphatase level   . Venous insufficiency of left lower extremity 07/04/2018  . Dyslipidemia 09/22/2017  . DDD (degenerative disc disease), cervical 04/03/2017  . DDD (degenerative disc disease), lumbar 04/03/2017  . Onychomycosis 03/21/2017  . Dysphagia 10/18/2016  . Encounter for chronic pain management 10/18/2016  . Biliary stasis 09/26/2016  . Fever blister 08/18/2016  . Urinary urgency 07/05/2016  . HNP (herniated nucleus pulposus), lumbar 09/23/2015  . Sinus congestion 07/30/2015  . Iron deficiency 07/30/2015  . Health maintenance examination 06/15/2015  . 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 Headache 03/09/2015  . Clavicle enlargement 12/13/2014  . Prediabetes 12/13/2014  . Plantar fasciitis, bilateral 09/11/2014  . Advanced care planning/counseling discussion 06/10/2014  . Medicare annual wellness visit, subsequent 06/10/2014  . Abnormal thyroid function test 06/10/2014  . Chronic pain syndrome 06/10/2014  . CFS (chronic fatigue syndrome) 04/08/2014  . Postmenopausal atrophic vaginitis 10/19/2012  . 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 Cervical disc disorder with radiculopathy of cervical region 05/28/2010  . CHEST PAIN UNSPECIFIED 02/28/2008  . APHTHOUS ULCERS  01/31/2008  . Chronic insomnia 11/09/2007  . Drug-induced constipation 10/11/2007  . Allergic rhinitis 04/12/2007  . MDD (major depressive disorder), recurrent episode, moderate ([place]) 03/05/2007  . [name] syndrome 03/05/2007  . GERD 03/05/2007  .  ROSACEA 03/05/2007  . NEURALGIA 03/05/2007  . 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[place]) 12/06/2006  . Chronic interstitial cystitis 12/06/2006  . Fibromyalgia 12/06/2006    Everlean Alstrom. Graylon Good, PT, DPT 12/09/19, 11:18 AM  [facility] [address] [place], [address] Phone: [phone]   Fax:  [phone]  Name: Rachel Duran MRN: [id number] Date of Birth: 12/15/1955

## 2019-12-12 ENCOUNTER — Ambulatory Visit: Payer: Medicare Other | Admitting: Physical Therapy

## 2019-12-17 ENCOUNTER — Ambulatory Visit: Payer: Medicare Other | Admitting: Physical Therapy

## 2019-12-17 ENCOUNTER — Other Ambulatory Visit: Payer: Self-pay | Admitting: Family Medicine

## 2019-12-19 ENCOUNTER — Ambulatory Visit: Payer: Medicare Other | Admitting: Physical Therapy

## 2019-12-24 ENCOUNTER — Encounter: Payer: Self-pay | Admitting: Physical Therapy

## 2019-12-24 ENCOUNTER — Ambulatory Visit: Payer: Medicare Other | Admitting: Physical Therapy

## 2019-12-24 DIAGNOSIS — M25562 Pain in left knee: Secondary | ICD-10-CM

## 2019-12-24 DIAGNOSIS — R262 Difficulty in walking, not elsewhere classified: Secondary | ICD-10-CM

## 2019-12-24 DIAGNOSIS — Z9181 History of falling: Secondary | ICD-10-CM

## 2019-12-24 DIAGNOSIS — M25561 Pain in right knee: Secondary | ICD-10-CM

## 2019-12-24 DIAGNOSIS — G8929 Other chronic pain: Secondary | ICD-10-CM

## 2019-12-24 NOTE — Therapy (Signed)
[facility] [address] [place], [place], [address] Phone: [phone]   Fax:  [phone]  Physical Therapy No-Visit Discharge Summary Reporting Period: 11/13/2019 - 12/24/2019  Patient Details  Name: Rachel Duran MRN: [id number] Date of Birth: 02/14/1956 Referring Provider (PT): [name], Shaili, MD   Encounter Date: 12/24/2019    Past Medical History:  Diagnosis Date  . Abdominal pain last 4 months   and nausea also  . Allergy   . Anemia    history of  . Anxiety   . Bipolar disorder (HCC)    atpical bipolar disorder  . 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 C6- C7 -hx. past fusion(bone graft used)  . Cholecystitis   . Chronic pain   . DDD (degenerative disc disease), lumbar 09/2015   dextroscoliosis with multilevel DDD and facet arthrosis most notable for R foraminal disc protrusion L4/5 producing severe R neural foraminal stenosis abutting R L4 nerve root, moderate spinal canal and mild lat recesss and R neural foraminal stenosis L3/4 (MRI)  . Depression    bipolar depression  . Disorders of porphyrin metabolism   . Felon of finger of left hand 11/10/2016  . Fibromyalgia   . GERD (gastroesophageal reflux disease)   . Headache    occasionally  . Internal hemorrhoids   . Interstitial cystitis 06-06-12   hx.  . Irritable bowel syndrome   . PONV (postoperative nausea and vomiting)    now uses stomach blockers and no ponv  . 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 Raynauds disease    hx.  . Seronegative arthritis    [name]  . 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 ANTERIOR CERVICAL DECOMP/DISCECTOMY FUSION  2004   C5/6, C6/7  .  ANTERIOR CERVICAL DECOMP/DISCECTOMY FUSION  02/2016   C3/4, C4/5 with plating ([facility])  . AUGMENTATION MAMMAPLASTY Bilateral 03/25/2010  . BREAST ENHANCEMENT SURGERY  2010  . BREAST IMPLANT EXCHANGE  10/2014   exchange saline implants, B mastopexy/capsulorraphy ([facility])  . BUNIONECTOMY Bilateral yrs ago  . CESAREAN SECTION  1985   x 1  . CHOLECYSTECTOMY  06/11/2012   Procedure: LAPAROSCOPIC CHOLECYSTECTOMY WITH INTRAOPERATIVE CHOLANGIOGRAM;  Surgeon: Eric M Wilson, MD,FACS;  Location: [facility];  Service: General;  Laterality: N/A;  . COLONOSCOPY  02/2018   done for positive cologuard - WNL, rpt 10 yrs (Stark)  . CYSTOSCOPY    . ERCP  05/22/2012   Procedure: ENDOSCOPIC RETROGRADE CHOLANGIOPANCREATOGRAPHY (ERCP);  Surgeon: Malcolm T Stark, MD,FACG;  Location: [facility];  Service: Endoscopy;  Laterality: N/A;  . ERCP N/A 09/17/2013   Procedure: ENDOSCOPIC RETROGRADE CHOLANGIOPANCREATOGRAPHY (ERCP);  Surgeon: Malcolm T Stark, MD;  Location: [facility];  Service: Endoscopy;  Laterality: N/A;  . ERCP N/A 09/27/2018   Procedure: ENDOSCOPIC RETROGRADE CHOLANGIOPANCREATOGRAPHY (ERCP);  Surgeon: Stark, Malcolm T, MD;  Location: [facility];  Service: Endoscopy;  Laterality: N/A;  . ESOPHAGOGASTRODUODENOSCOPY  09/2016   WNL. esophagus dilated (Stark)  . HEMORRHOID BANDING  09-23-13   --Dr. Eric Wilson  . HERNIA REPAIR     inguinal  . HYSTEROSCOPY W/ ENDOMETRIAL ABLATION    . NASAL SINUS SURGERY     x5  .   REMOVAL OF STONES  09/27/2018   Procedure: REMOVAL OF STONES;  Surgeon: Stark, Malcolm T, MD;  Location: [facility];  Service: Endoscopy;;  . SPHINCTEROTOMY  09/27/2018   Procedure: SPHINCTEROTOMY;  Surgeon: Stark, Malcolm T, MD;  Location: [facility];  Service: Endoscopy;;  . TOTAL HIP ARTHROPLASTY Left 03/12/2019   Procedure: LEFT TOTAL HIP ARTHROPLASTY ANTERIOR APPROACH;  (Blackman, Christopher Y, MD)  . UPPER GASTROINTESTINAL ENDOSCOPY      There were no vitals filed for this  visit.  Subjective Assessment - 12/24/19 1108    Subjective  Patient called to cancel appt today due to ongoing sinus infection and requested return call from PT. Called pt back and she requested to discharge due to feeling she had progressed as much as possible at this point. Reports her knees are feeling a bit better with less exercise and just walking recently. States she has already had injections that did not help she thinks after onset of new pain in January. Reports she had a lot of pain a couple of hours following last treatment session and thinks the manual therapy may have been just a little bit too much. Reports she is planning to return to orthopedist.    Pertinent History  Patient is a 64 y.o. female who presents to outpatient physical therapy with a referral for medical diagnosis primary osteoarthritis of both knees, chronic pain of both knees, chondromalacia patella - bilateral knees. This patient's chief complaints consist of knee pain, weakness, poor activity tolerance leading to the following functional deficits: difficulty with ADLs, IADLs, basic mobility and usual activities including difficulty walking dogs, gardening, getting up and down to knees and up, standing in place long enough to check out at grocery store, limited activity tolerance, stairs, walking. Relevant past medical history and comorbidities include raynaud, lumbar and cervical spine pain with disectomies and fusions in the cervical spine (C3-C7 bone graft used), symptomatic with nausea and dizzines when moves c-spine too much, chronic fatigue syndrome (3-4 good hours a day), interstitial cystitis, DDD, scoliosis, bursitis R hip, L THA (august 2020), headaches (1x a week migraines), insomnia, fibromyalgia, reflux, bile duct stones, cholecystectomy, chronic pain, pain medication induced GI problem (managed with medication), atypical biploar disorder (fairly well managed), anxiety, osteopenia, Stargardt's disease, seronegative  arhtritis. Patient denies hx of cancer, stroke, seizures, lung problem, major cardiac events, diabetes, unexplained weight loss, changes in bowel or bladder problems, saddle paresthesia, new onset stumbling or dropping things.    Limitations  Standing;Walking;House hold activities;Other (comment)   difficulty with ADLs, IADLs, basic mobility and usual activities including difficulty walking dogs, gardening, getting up and down to knees and up, standing in place long enough to check out at grocery store, limited activity tolerance, stairs, walking.   How long can you sit comfortably?  other things bothers her in sitting, knees no worse    How long can you stand comfortably?  10 min (not comfortable)    How long can you walk comfortably?  25 min before having to stop (not comfortable).    Diagnostic tests  radiograph of bilateral knees report 09/05/2019: "Mild to moderate medial compartment narrowing was noted.  No chondrocalcinosis was noted.  Severe patellofemoral narrowing was noted. Impression: These findings are consistent with mild to moderate osteoarthritis and severe chondromalacia patella."    Patient Stated Goals  wants to get stronger, feels LE very weak, get a good exercise program, how to manage exercise, times of day and what types of exercise, and make knee pain   go away.       OBJECTIVE Patient is not present for examination at this time. Please see previous documentation for latest objective data.   PT Education - 12/24/19 1111    Education Details  reccomendations at discharge    Person(s) Educated  Patient    Methods  Explanation    Comprehension  Verbalized understanding       PT Short Term Goals - 11/19/19 1547      PT SHORT TERM GOAL #1   Title  Be independent with initial home exercise program for self-management of symptoms.    Baseline  initial HEP provided at IE (11/13/2019);    Time  2    Period  Weeks    Status  Achieved    Target Date  11/27/19        PT Long  Term Goals - 12/24/19 1111      PT LONG TERM GOAL #1   Title  Be independent with a long-term home exercise program for self-management of symptoms.    Baseline  initial HEP provided at IE (11/13/2019);    Time  12    Period  Weeks    Status  Partially Met    Target Date  02/05/20      PT LONG TERM GOAL #2   Title  Demonstrate improved FOTO score by 10 units to demonstrate improvement in overall condition and self-reported functional ability.    Baseline  deferred to visit 2 (11/13/2019);    Time  12    Period  Weeks    Status  Unable to assess    Target Date  02/05/20      PT LONG TERM GOAL #3   Title  Improve BLE  strength to 4+/5 for improved ability to allow patient to complete valued functional tasks such as gardening, and walking dogs with less difficulty.    Baseline  see objective exam (11/13/2019);    Time  12    Period  Weeks    Status  Not Met    Target Date  02/08/20      PT LONG TERM GOAL #4   Title  Complete community, work and/or recreational activities without limitation due to current condition.    Baseline  difficulty with ADLs, IADLs, basic mobility and usual activities including difficulty walking dogs, gardening, getting up and down to knees and up, standing in place long enough to check out at grocery store, limited activity tolerance, stairs, walking.    Time  12    Period  Weeks    Status  Not Met    Target Date  02/05/20      PT LONG TERM GOAL #5   Title  Reduce pain with functional activities to equal or less than 3/10 to allow patient to complete usual activities including ADLs, IADLs, and social engagement with less difficulty.    Baseline  up to 8/10 (11/13/2019);    Time  12    Period  Weeks    Status  Not Met    Target Date  02/08/20       Plan - 12/24/19 1114    Clinical Impression Statement  Patient attended 7 physical therapy sessions this episode of care. She had very limited activity tolerance and great care was taken to provide gentle  interventions with graded progression. Patient has requested discharge at this time due to feeling she has reached her maximum benefit from physical therapy. She has been provided with an appropriate   HEP and will now be discharged.    Personal Factors and Comorbidities  Age;Time since onset of injury/illness/exacerbation;Past/Current Experience;Comorbidity 3+;Other   chronicity and severity of multiple comorbid conditions   Comorbidities  Relevant past medical history and comorbidities include raynaud, lumbar and cervical spine pain with disectomies and fusions in the cervical spine (C3-C7 bone graft used), symptomatic with nausea and dizzines when moves c-spine too much, chronic fatigue syndrome (3-4 good hours a day), interstitial cystitis, DDD, scoliosis, bursitis R hip, L THA (august 2020), headaches (1x a week migraines), insomnia, fibromyalgia, reflux, bile duct stones, cholecystectomy, chronic pain, pain medication induced GI problem (managed with medication), atypical biploar disorder (fairly well managed), anxiety, osteopenia, Stargardt's disease, seronegative arthritis.    Examination-Activity Limitations  Bathing;Bend;Caring for Others;Stand;Stairs;Squat;Dressing;Transfers   difficulty with ADLs, IADLs, basic mobility and usual activities including difficulty walking dogs, gardening, getting up and down to knees and up, standing in place long enough to check out at grocery store, limited activity tolerance, stairs, walking.   Examination-Participation Restrictions  Laundry;Shop;Cleaning;Community Activity;Yard Work;Meal Prep;Other    Stability/Clinical Decision Making  Evolving/Moderate complexity    Rehab Potential  Fair    PT Frequency  2x / week    PT Duration  12 weeks    PT Treatment/Interventions  ADLs/Self Care Home Management;Aquatic Therapy;Biofeedback;Cryotherapy;Electrical Stimulation;Moist Heat;Functional mobility training;Therapeutic activities;Therapeutic exercise;Balance  training;Neuromuscular re-education;Patient/family education;Manual techniques;Passive range of motion;Dry needling;Energy conservation;Joint Manipulations    PT Next Visit Plan  Patient is now discharged from physical therapy    PT [place] Access Code: [email or web address]    Consulted and Agree with Plan of Care  Patient       Patient will benefit from skilled therapeutic intervention in order to improve the following deficits and impairments:  Dizziness, Pain, Postural dysfunction, Decreased mobility, Decreased activity tolerance, Decreased endurance, Decreased range of motion, Decreased strength, Impaired perceived functional ability, Impaired flexibility, Difficulty walking, Decreased balance, Hypomobility, Increased muscle spasms  Visit Diagnosis: Chronic pain of right knee  Chronic pain of left knee  Difficulty in walking, not elsewhere classified  History of falling     Problem List Patient Active Problem List   Diagnosis Date Noted  . Sore throat 10/02/2019  . Positive ANA (antinuclear antibody) 06/05/2019  . 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  . Unilateral primary osteoarthritis, left hip 01/28/2019  . Pelvic pain 12/12/2018  . Common bile duct (CBD) obstruction   . Elevated alkaline phosphatase level   . Venous insufficiency of left lower extremity 07/04/2018  . Dyslipidemia 09/22/2017  . DDD (degenerative disc disease), cervical 04/03/2017  . DDD (degenerative disc disease), lumbar 04/03/2017  . Onychomycosis 03/21/2017  . Dysphagia 10/18/2016  . Encounter for chronic pain management 10/18/2016  . Biliary stasis 09/26/2016  . Fever blister 08/18/2016  . Urinary urgency 07/05/2016  . HNP (herniated nucleus pulposus), lumbar 09/23/2015  . Sinus congestion 07/30/2015  . Iron deficiency 07/30/2015  . Health maintenance examination 06/15/2015  . 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 Headache 03/09/2015  . Clavicle enlargement 12/13/2014   . Prediabetes 12/13/2014  . Plantar fasciitis, bilateral 09/11/2014  . Advanced care planning/counseling discussion 06/10/2014  . Medicare annual wellness visit, subsequent 06/10/2014  . Abnormal thyroid function test 06/10/2014  . Chronic pain syndrome 06/10/2014  . CFS (chronic fatigue syndrome) 04/08/2014  . Postmenopausal atrophic vaginitis 10/19/2012  . 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 Cervical disc disorder with radiculopathy of cervical region 05/28/2010  . CHEST PAIN UNSPECIFIED 02/28/2008  .  APHTHOUS ULCERS 01/31/2008  . Chronic insomnia 11/09/2007  . Drug-induced constipation 10/11/2007  . Allergic rhinitis 04/12/2007  . MDD (major depressive disorder), recurrent episode, moderate (HCC) 03/05/2007  . [name] syndrome 03/05/2007  . GERD 03/05/2007  . ROSACEA 03/05/2007  . NEURALGIA 03/05/2007  . 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 Chronic interstitial cystitis 12/06/2006  . Fibromyalgia 12/06/2006     R. , PT, DPT 12/24/19, 11:14 AM  [facility] [address] [place], [place], [address] Phone: [phone]   Fax:  [phone]  Name: Rachel Duran MRN: [id number] Date of Birth: 09/13/1955   

## 2019-12-25 ENCOUNTER — Telehealth: Payer: Self-pay | Admitting: Family Medicine

## 2019-12-25 ENCOUNTER — Other Ambulatory Visit: Payer: Self-pay

## 2019-12-25 ENCOUNTER — Ambulatory Visit
Admission: RE | Admit: 2019-12-25 | Discharge: 2019-12-25 | Disposition: A | Discharge status: Home / Self Care | Payer: Medicare Other | Source: Ambulatory Visit | Attending: Obstetrics and Gynecology | Admitting: Obstetrics and Gynecology

## 2019-12-25 DIAGNOSIS — Z1231 Encounter for screening mammogram for malignant neoplasm of breast: Secondary | ICD-10-CM

## 2019-12-25 NOTE — Telephone Encounter (Signed)
Patient came in and dropped off a [employer] [facility] paper for her prescriptions. She says Dr.Gutierrez should be expecting this paper. I left it on the cart!

## 2019-12-25 NOTE — Telephone Encounter (Addendum)
Movantik approval will expire 01/20/2020.  plz complete new PA for Movantik for drug induced constipation (K59.03), not adequately controlled on linzess, miralax, MOM, sennakot and benefiber.

## 2019-12-26 ENCOUNTER — Ambulatory Visit: Payer: Medicare Other | Admitting: Physical Therapy

## 2019-12-27 NOTE — Telephone Encounter (Addendum)
Submitted PA for Movantik 25 mg tab; key:  [place].  Decision pending.

## 2019-12-30 ENCOUNTER — Encounter: Payer: Medicare Other | Admitting: Physical Therapy

## 2020-01-01 ENCOUNTER — Encounter: Payer: Medicare Other | Admitting: Physical Therapy

## 2020-01-06 ENCOUNTER — Other Ambulatory Visit: Payer: Self-pay | Admitting: Family Medicine

## 2020-01-09 ENCOUNTER — Other Ambulatory Visit: Payer: Self-pay | Admitting: Family Medicine

## 2020-01-10 ENCOUNTER — Encounter: Payer: Self-pay | Admitting: Family Medicine

## 2020-01-10 NOTE — Telephone Encounter (Signed)
ERx 

## 2020-01-13 ENCOUNTER — Encounter: Payer: Medicare Other | Admitting: Physical Therapy

## 2020-01-15 ENCOUNTER — Encounter: Payer: Medicare Other | Admitting: Physical Therapy

## 2020-01-20 ENCOUNTER — Encounter: Payer: Medicare Other | Admitting: Physical Therapy

## 2020-01-21 ENCOUNTER — Encounter: Payer: Self-pay | Admitting: Gastroenterology

## 2020-01-21 ENCOUNTER — Ambulatory Visit (INDEPENDENT_AMBULATORY_CARE_PROVIDER_SITE_OTHER): Payer: Medicare Other | Admitting: Gastroenterology

## 2020-01-21 VITALS — BP 130/80 | HR 93 | Wt 122.0 lb

## 2020-01-21 DIAGNOSIS — E611 Iron deficiency: Secondary | ICD-10-CM

## 2020-01-21 DIAGNOSIS — K5903 Drug induced constipation: Secondary | ICD-10-CM

## 2020-01-21 DIAGNOSIS — K219 Gastro-esophageal reflux disease without esophagitis: Secondary | ICD-10-CM

## 2020-01-21 DIAGNOSIS — K831 Obstruction of bile duct: Secondary | ICD-10-CM | POA: Diagnosis not present

## 2020-01-21 MED ORDER — URSODIOL 300 MG PO CAPS: 300.0000 mg | ORAL_CAPSULE | Freq: Two times a day (BID) | ORAL | 3 refills | Status: DC

## 2020-01-21 MED ORDER — PANTOPRAZOLE SODIUM 40 MG PO TBEC: DELAYED_RELEASE_TABLET | ORAL | 3 refills | Status: DC

## 2020-01-21 NOTE — Progress Notes (Signed)
    History of Present Illness: This is a 64 year old female with biliary stasis, recurrent bile duct stones, constipation and GERD.  Her constipation and reflux are well controlled on her current regimen.  She states she has very rare episodes of reflux breakthrough.  She takes MiraLAX about once or twice a week.  LFTs were normal in December 2020.  Iron deficiency anemia was noted and has been under treatment by her PCP.      Current Medications, Allergies, Past Medical History, Past Surgical History, Family History and Social History were reviewed in [employer] record.   Physical Exam: General: Well developed, well nourished, no acute distress Head: Normocephalic and atraumatic Eyes:  sclerae anicteric, EOMI Ears: Normal auditory acuity Mouth: Not examined, mask on during Covid-19 pandemic Neck: Supple, no masses or thyromegaly Lungs: Clear throughout to auscultation Heart: Regular rate and rhythm; no murmurs, rubs or bruits Abdomen: Soft, non tender and non distended. No masses, hepatosplenomegaly or hernias noted. Normal Bowel sounds Rectal: Not done Musculoskeletal: Symmetrical with no gross deformities  Skin: No lesions on visible extremities Pulses:  Normal pulses noted Extremities: No clubbing, cyanosis, edema or deformities noted Neurological: Alert oriented x 4, grossly nonfocal Cervical Nodes:  No significant cervical adenopathy Inguinal Nodes: No significant inguinal adenopathy Psychological:  Alert and cooperative. Normal mood and affect   Assessment and Recommendations:  1.  Biliary stasis, recurrent bile duct stones. Prior ERCPs with sphinctertomy, most recently in March 2020. Continue Ursodiol 300 mg po bid long term. Monitor LFTs at least annually per her PCP.   2.  Constipation. Controlled on Movantik 25 mg qd and Miralax qd prn.   3. GERD.  Follow antireflux measures.  Continue pantoprazole 40 mg bid.   4.  Iron deficiency anemia.   IgA, tTG normal in 2017. Colonoscopy in August 2019 was normal.  ERCP showed an unremarkable limited endoscopic examination of the upper GI tract in March 2020.  If iron deficiency is refractory or recurrent consider further GI evaluation and a repeat tTG.  5. CRC screening, average risk. Colonoscopy 02/2028.

## 2020-01-21 NOTE — Patient Instructions (Signed)
We have sent the following medications to your pharmacy for you to pick up at your convenience:ursodiol and protonix.   Thank you for choosing me and [facility].  Pricilla Riffle. Dagoberto Ligas., MD., [facility]

## 2020-01-22 ENCOUNTER — Encounter: Payer: Medicare Other | Admitting: Physical Therapy

## 2020-01-23 ENCOUNTER — Encounter: Payer: Medicare Other | Admitting: Physical Therapy

## 2020-01-28 ENCOUNTER — Other Ambulatory Visit: Payer: Self-pay | Admitting: Family Medicine

## 2020-01-28 NOTE — Telephone Encounter (Signed)
Name of Medication: Eszopiclone Name of Pharmacy: [facility] or Written Date and Quantity: 10/29/19, #90 Last Office Visit and Type: 11/04/19, pain mngmt f/u Next Office Visit and Type: 02/03/20, 3 mo f/u Last Controlled Substance Agreement Date: 04/19/19 Last UDS: 04/19/19

## 2020-01-30 NOTE — Telephone Encounter (Signed)
ERx 

## 2020-02-03 ENCOUNTER — Other Ambulatory Visit: Payer: Self-pay

## 2020-02-03 ENCOUNTER — Encounter: Payer: Self-pay | Admitting: Family Medicine

## 2020-02-03 ENCOUNTER — Ambulatory Visit (INDEPENDENT_AMBULATORY_CARE_PROVIDER_SITE_OTHER): Payer: Medicare Other | Admitting: Family Medicine

## 2020-02-03 VITALS — BP 120/82 | HR 94 | Temp 97.5°F | Ht 64.0 in | Wt 124.6 lb

## 2020-02-03 DIAGNOSIS — E611 Iron deficiency: Secondary | ICD-10-CM

## 2020-02-03 DIAGNOSIS — N3010 Interstitial cystitis (chronic) without hematuria: Secondary | ICD-10-CM

## 2020-02-03 DIAGNOSIS — M5010 Cervical disc disorder with radiculopathy, unspecified cervical region: Secondary | ICD-10-CM

## 2020-02-03 DIAGNOSIS — G894 Chronic pain syndrome: Secondary | ICD-10-CM

## 2020-02-03 DIAGNOSIS — G8929 Other chronic pain: Secondary | ICD-10-CM | POA: Diagnosis not present

## 2020-02-03 DIAGNOSIS — K831 Obstruction of bile duct: Secondary | ICD-10-CM

## 2020-02-03 DIAGNOSIS — I7300 Raynaud's syndrome without gangrene: Secondary | ICD-10-CM

## 2020-02-03 LAB — COMPREHENSIVE METABOLIC PANEL
ALT: 14 U/L (ref 0–35)
AST: 20 U/L (ref 0–37)
Albumin: 4.4 g/dL (ref 3.5–5.2)
Alkaline Phosphatase: 79 U/L (ref 39–117)
BUN: 6 mg/dL (ref 6–23)
CO2: 30 mEq/L (ref 19–32)
Calcium: 9.4 mg/dL (ref 8.4–10.5)
Chloride: 97 mEq/L (ref 96–112)
Creatinine, Ser: 0.6 mg/dL (ref 0.40–1.20)
GFR: 101.29 mL/min (ref >60.00)
Glucose, Bld: 103 mg/dL — ABNORMAL HIGH (ref 70–99)
Potassium: 4.8 mEq/L (ref 3.5–5.1)
Sodium: 133 mEq/L — ABNORMAL LOW (ref 135–145)
Total Bilirubin: 0.4 mg/dL (ref 0.2–1.2)
Total Protein: 6.6 g/dL (ref 6.0–8.3)

## 2020-02-03 LAB — CBC WITH DIFFERENTIAL/PLATELET
Basophils Absolute: 0 10*3/uL (ref 0.0–0.1)
Basophils Relative: 0.6 % (ref 0.0–3.0)
Eosinophils Absolute: 0.1 10*3/uL (ref 0.0–0.7)
Eosinophils Relative: 1.1 % (ref 0.0–5.0)
HCT: 37.4 % (ref 36.0–46.0)
Hemoglobin: 12.4 g/dL (ref 12.0–15.0)
Lymphocytes Relative: 26.3 % (ref 12.0–46.0)
Lymphs Abs: 2.1 10*3/uL (ref 0.7–4.0)
MCHC: 33.2 g/dL (ref 30.0–36.0)
MCV: 86.5 fl (ref 78.0–100.0)
Monocytes Absolute: 0.5 10*3/uL (ref 0.1–1.0)
Monocytes Relative: 6.5 % (ref 3.0–12.0)
Neutro Abs: 5.1 10*3/uL (ref 1.4–7.7)
Neutrophils Relative %: 65.5 % (ref 43.0–77.0)
Platelets: 307 10*3/uL (ref 150.0–400.0)
RBC: 4.32 Mil/uL (ref 3.87–5.11)
RDW: 13.9 % (ref 11.5–15.5)
WBC: 7.8 10*3/uL (ref 4.0–10.5)

## 2020-02-03 LAB — IBC PANEL
Iron: 60 ug/dL (ref 42–145)
Saturation Ratios: 14.3 % — ABNORMAL LOW (ref 20.0–50.0)
Transferrin: 300 mg/dL (ref 212.0–360.0)

## 2020-02-03 LAB — FERRITIN: Ferritin: 62.1 ng/mL (ref 10.0–291.0)

## 2020-02-03 MED ORDER — MORPHINE SULFATE 15 MG PO TABS: 15.0000 mg | ORAL_TABLET | Freq: Three times a day (TID) | ORAL | 0 refills | Status: DC | PRN

## 2020-02-03 NOTE — Assessment & Plan Note (Signed)
Sees GI on regular ursodiol.

## 2020-02-03 NOTE — Assessment & Plan Note (Signed)
Appreciate rheum care. Continues amlodipine

## 2020-02-03 NOTE — Assessment & Plan Note (Signed)
[place] CSRS reviewed  Will continue MSIR 15mg  TID PRN #90/mo

## 2020-02-03 NOTE — Progress Notes (Signed)
This visit was conducted in person.  BP 120/82 (BP Location: Left Arm, Patient Position: Sitting, Cuff Size: Normal)   Pulse 94   Temp (!) 97.5 F (36.4 C) (Temporal)   Ht 5\' 4"  (1.626 m)   Wt 124 lb 9 oz (56.5 kg)   LMP 07/25/1998   SpO2 96%   BMI 21.38 kg/m    CC: 3 mo f/u visit chronic pain  Subjective:    Patient ID: Rachel Duran, female    DOB: [birth year], 64 y.o.   MRN: [id number]  HPI: Rachel Duran is a 64 y.o. female presenting on 02/03/2020 for Chronic Pain Mangement (Here for 3 mo f/u.)   See prior notes for details.  Longstanding chronic pain managed with opiate, long acting morphine formulations worked well for her but have slowly been discontinued over the years (3 in the past 5 years - Avinza, Embeda, Kadian). Intolerable side effects to fentanyl and tramadol (recent trials).  Some constipation managed with miralax, linzess daily, movantik daily as well.   Increasing pain recently - has needed to increase morphine 15mg  IR to TID PRN (was taking BID). Worsening R>L knee pain planned ortho f/u [name]) later this week. No benefit with PT for patellar tendonitis. Had steroid injections to knees through Dr Estanislado Pandy.   Saw [facility] urology last month for IC and high tone pelvic floor dysfunction on elmiron and pyridium PRN.   Saw [facility] for biliary stasis and h/ recurrent bile duct stones s/p prior ERCPs with sphincterotomy latest 09/2018 and ursodiol 300mg  bid. IDA on ferrous sulfate MWF. Due for rpt labs.   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 Noted bites to left distal anterior thigh as well as yesterday R ventral wrist. No noted insect bite. Not itchy but is painful.      Relevant past medical, surgical, family and social history reviewed and updated as indicated. Interim medical history since our last visit reviewed. Allergies and medications reviewed and updated. Outpatient Medications Prior to Visit  Medication Sig Dispense Refill  . ALPRAZolam  (XANAX) 1 MG tablet Take 0.5 mg by mouth at bedtime.   0  . AMBULATORY NON FORMULARY MEDICATION Medication Name: Gi cocktail 5-10 ml every 4-6 hours as needed for pain. 450 mL 1  . amLODipine (NORVASC) 5 MG tablet Take 1 tablet (5 mg total) by mouth daily. 90 tablet 0  . amphetamine-dextroamphetamine (ADDERALL XR) 30 MG 24 hr capsule TAKE 1 CAPSULE BY MOUTH EVERY MORNING 90 capsule 0  . ARIPiprazole (ABILIFY) 5 MG tablet Take 5 mg by mouth at bedtime.     . calcium-vitamin D (OSCAL WITH D) 500-200 MG-UNIT tablet Take 1 tablet by mouth 2 (two) times daily.    . cyanocobalamin (,VITAMIN B-12,) 1000 MCG/ML injection INJECT 1 ML IM EVERY 21 DAYS 3 mL 3  . cyclobenzaprine (FLEXERIL) 5 MG tablet TAKE ONE TABLET BY MOUTH 3 TIMES DAILY AS NEEDED FOR MUSCLE SPASMS 30 tablet 1  . diclofenac sodium (VOLTAREN) 1 % GEL Apply 2-4 grams to affected joint up to 4 times daily. 400 g 4  . Estradiol (YUVAFEM) 10 MCG TABS vaginal tablet Place 1 tablet (10 mcg total) vaginally 3 (three) times a week. Please keep on file. (Patient taking differently: Place 1 tablet vaginally See admin instructions. Place 1 tablet vaginally Tuesday, Thursday and Saturday.) 12 tablet 10  . Eszopiclone 3 MG TABS TAKE ONE TABLET AT BEDTIME 90 tablet 0  . ferrous sulfate 325 (65 FE) MG tablet Take 325  mg by mouth every Monday, Wednesday, and Friday.     . fluticasone (FLONASE) 50 MCG/ACT nasal spray Place 1 spray into both nostrils daily as needed for allergies. 16 g 5  . lamoTRIgine (LAMICTAL) 100 MG tablet Take 100 mg by mouth daily.    Marland Kitchen LINZESS 290 MCG CAPS capsule TAKE 1 CAPSULE BY MOUTH DAILY 90 capsule 1  . methocarbamol (ROBAXIN) 500 MG tablet Take 1 tablet (500 mg total) by mouth every 6 (six) hours as needed for muscle spasms. 40 tablet 1  . MOVANTIK 25 MG TABS tablet TAKE 1 TABLET BY MOUTH DAILY 30 tablet 6  . NONFORMULARY OR COMPOUNDED ITEM Testosterone propionate 2% in white petrolatum, apply small amount once daily for 5 days  per week. 60 each 0  . ondansetron (ZOFRAN) 4 MG tablet TAKE ONE TABLET EVERY EIGHT HOURS AS NEEDED FOR NAUSEA / VOMITING 30 tablet 1  . ondansetron (ZOFRAN-ODT) 4 MG disintegrating tablet TAKE 1 TABLET BY MOUTH EVERY 8 HOURS AS NEEDED FOR NAUSEA OR VOMITING 20 tablet 1  . pantoprazole (PROTONIX) 40 MG tablet TAKE 1 TABLET BY MOUTH TWICE DAILY WITH A MEAL 180 tablet 3  . pentosan polysulfate (ELMIRON) 100 MG capsule Take 200 mg by mouth 2 (two) times daily.     . polyethylene glycol (MIRALAX) 17 g packet Take 17 g by mouth 3 (three) times daily.     . pregabalin (LYRICA) 150 MG capsule TAKE 1 CAPSULE BY MOUTH TWICE DAILY 180 capsule 0  . senna (SENOKOT) 8.6 MG tablet Take 2 tablets by mouth daily as needed.     . SF 5000 PLUS 1.1 % CREA dental cream Place 1 application onto teeth at bedtime.     . ursodiol (ACTIGALL) 300 MG capsule Take 1 capsule (300 mg total) by mouth 2 (two) times daily. 180 capsule 3  . morphine (MSIR) 15 MG tablet TAKE ONE TABLET 3 TIMES DAILY AS NEEDED FOR PAIN 80 tablet 0   No facility-administered medications prior to visit.     Per HPI unless specifically indicated in ROS section below Review of Systems Objective:  BP 120/82 (BP Location: Left Arm, Patient Position: Sitting, Cuff Size: Normal)   Pulse 94   Temp (!) 97.5 F (36.4 C) (Temporal)   Ht 5\' 4"  (1.626 m)   Wt 124 lb 9 oz (56.5 kg)   LMP 07/25/1998   SpO2 96%   BMI 21.38 kg/m   Wt Readings from Last 3 Encounters:  02/03/20 124 lb 9 oz (56.5 kg)  01/21/20 122 lb (55.3 kg)  11/04/19 121 lb 9 oz (55.1 kg)      Physical Exam Vitals and nursing note reviewed.  Constitutional:      Appearance: Normal appearance. She is not ill-appearing.  Cardiovascular:     Rate and Rhythm: Normal rate and regular rhythm.     Pulses: Normal pulses.     Heart sounds: Normal heart sounds. No murmur heard.   Pulmonary:     Effort: Pulmonary effort is normal. No respiratory distress.     Breath sounds: Normal  breath sounds. No wheezing, rhonchi or rales.  Musculoskeletal:     Right lower leg: No edema.     Left lower leg: No edema.  Skin:    General: Skin is warm and dry.     Findings: No rash.     Comments: Healing abrasion to R ventral wrist  Neurological:     Mental Status: She is alert.  Psychiatric:  Mood and Affect: Mood normal.        Behavior: Behavior normal.       Assessment & Plan:  This visit occurred during the SARS-CoV-2 public health emergency.  Safety protocols were in place, including screening questions prior to the visit, additional usage of staff PPE, and extensive cleaning of exam room while observing appropriate contact time as indicated for disinfecting solutions.   Problem List Items Addressed This Visit    [name] syndrome    Appreciate rheum care. Continues amlodipine       Iron deficiency - Primary    On MWF oral iron dosing - update iron panel.  Per GI - if recalcitrant deficiency, consider further GI workup including rpt IgA TTG      Relevant Orders   IBC panel   Ferritin   Comprehensive metabolic panel   CBC with Differential/Platelet   Encounter for chronic pain management    [place] CSRS reviewed  Will continue MSIR 15mg  TID PRN #90/mo      Chronic pain syndrome   Relevant Medications   morphine (MSIR) 15 MG tablet   Chronic interstitial cystitis    Continues elmiron and pyridiuim PRN. Followed by [facility] urology last seen 12/2019.       Cervical disc disorder with radiculopathy of cervical region    Planning to return to Dr Ellene Route.       Biliary stasis    Sees GI on regular ursodiol.           Meds ordered this encounter  Medications  . morphine (MSIR) 15 MG tablet    Sig: Take 1 tablet (15 mg total) by mouth 3 (three) times daily as needed for moderate pain or severe pain.    Dispense:  90 tablet    Refill:  0    Note new sig   Orders Placed This Encounter  Procedures  . IBC panel  . Ferritin  . Comprehensive metabolic  panel  . CBC with Differential/Platelet    Patient Instructions  Good to see you today Continue current medicines.  Labs today.   Follow up plan: Return in about 3 months (around 05/05/2020), or if symptoms worsen or fail to improve, for follow up visit.  Ria Bush, MD

## 2020-02-03 NOTE — Assessment & Plan Note (Signed)
Planning to return to Dr Ellene Route.

## 2020-02-03 NOTE — Assessment & Plan Note (Addendum)
Continues elmiron and pyridiuim PRN. Followed by [facility] urology last seen 12/2019.

## 2020-02-03 NOTE — Assessment & Plan Note (Signed)
On MWF oral iron dosing - update iron panel.  Per GI - if recalcitrant deficiency, consider further GI workup including rpt IgA TTG

## 2020-02-03 NOTE — Patient Instructions (Signed)
Good to see you today Continue current medicines.  Labs today.

## 2020-02-04 ENCOUNTER — Other Ambulatory Visit: Payer: Self-pay | Admitting: *Deleted

## 2020-02-04 NOTE — Telephone Encounter (Signed)
Medication refill request: Rachel Duran  Last AEX:  01-31-2019 BS Next AEX: 02-06-20  Last MMG (if hormonal medication request): 12-25-19 density B/BIRADS 1 negative  Refill authorized: Today,please advise.   Medication pended for #12, 0RF. Please refill if appropriate.

## 2020-02-04 NOTE — Progress Notes (Signed)
64 y.o. G7P2002 [name] here for annual exam.    Has her left hip replaced in August, 2020.  Doing well.   Still dealing with severe vaginal dryness internally and externally.  Currently using Vagifem three times a week. She did not like the Estring.  It felt uncomfortable.  Testosterone is still working well for her.  Wants to continue current prescription if her levels are ok.   Received her Covid vaccine.   PCP:  Ria Bush, MD   Patient's last menstrual period was 07/25/1998.           Sexually active: Yes.    The current method of family planning is post menopausal status.    Exercising: Yes.    walks 3-4x/week Smoker:  no  Health Maintenance: Pap:  01-31-19 Neg, 01-13-17 Neg, 12-19-14 Neg:Neg HR HPV History of abnormal Pap:  no MMG: 12-25-19 3D/Implants/neg/density B/Birads1 Colonoscopy: 02-22-18 normal;next 02/2028 BMD: 05-16-19  Result :Osteopenia TDaP: 11-10-16 Gardasil:   no HIV: Neg in the past Hep C: 07-16-15 Neg Screening Labs: PCP.    reports that she has never smoked. She has never used smokeless tobacco. She reports that she does not drink alcohol and does not use drugs.  Past Medical History:  Diagnosis Date  . Abdominal pain last 4 months   and nausea also  . Allergy   . Anemia    history of  . Anxiety   . Bipolar disorder ([place])    atpical bipolar disorder  . 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 C6- C7 -hx. past fusion(bone graft used)  . Cholecystitis   . Chronic pain   . DDD (degenerative disc disease), lumbar 09/2015   dextroscoliosis with multilevel DDD and facet arthrosis most notable for R foraminal disc protrusion L4/5 producing severe R neural foraminal stenosis abutting R L4 nerve root, moderate spinal canal and mild lat recesss and R neural foraminal stenosis L3/4 (MRI)  . Depression    bipolar depression  . Disorders of porphyrin metabolism   . Felon of finger of left hand 11/10/2016  . Fibromyalgia    . GERD (gastroesophageal reflux disease)   . Headache    occasionally  . Internal hemorrhoids   . Interstitial cystitis 06-06-12   hx.  . Irritable bowel syndrome   . PONV (postoperative nausea and vomiting)    now uses stomach blockers and no ponv  . 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 Raynauds disease    hx.  . Seronegative arthritis    [name]  . 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 ANTERIOR CERVICAL DECOMP/DISCECTOMY FUSION  2004   C5/6, C6/7  . ANTERIOR CERVICAL DECOMP/DISCECTOMY FUSION  02/2016   C3/4, C4/5 with plating [name])  . AUGMENTATION MAMMAPLASTY Bilateral 03/25/2010  . BREAST ENHANCEMENT SURGERY  2010  . BREAST IMPLANT EXCHANGE  10/2014   exchange saline implants, B mastopexy/capsulorraphy ([facility])  . BUNIONECTOMY Bilateral yrs ago  . [place]   x 1  . CHOLECYSTECTOMY  06/11/2012   Procedure: LAPAROSCOPIC CHOLECYSTECTOMY WITH INTRAOPERATIVE CHOLANGIOGRAM;  Surgeon: [name], MD,FACS;  Location: [facility] ORS;  Service: General;  Laterality: N/A;  . COLONOSCOPY  02/2018   done  for positive cologuard - WNL, rpt 10 yrs Fuller Plan)  . CYSTOSCOPY    . ERCP  05/22/2012   Procedure: ENDOSCOPIC RETROGRADE CHOLANGIOPANCREATOGRAPHY (ERCP);  Surgeon: [name], MD,FACG;  Location: [facility];  Service: Endoscopy;  Laterality: N/A;  . ERCP N/A 09/17/2013   Procedure: ENDOSCOPIC RETROGRADE CHOLANGIOPANCREATOGRAPHY (ERCP);  Surgeon: [name], MD;  Location: [facility];  Service: Endoscopy;  Laterality: N/A;  . ERCP N/A 09/27/2018   Procedure: ENDOSCOPIC RETROGRADE CHOLANGIOPANCREATOGRAPHY (ERCP);  Surgeon: [name], MD;  Location: [facility];  Service: Endoscopy;  Laterality: N/A;   . ESOPHAGOGASTRODUODENOSCOPY  09/2016   WNL. esophagus dilated Fuller Plan)  . HEMORRHOID BANDING  09-23-13   --Dr. Greer Pickerel  . HERNIA REPAIR     inguinal  . HYSTEROSCOPY W/ ENDOMETRIAL ABLATION    . NASAL SINUS SURGERY     x5  . REMOVAL OF STONES  09/27/2018   Procedure: REMOVAL OF STONES;  Surgeon: [name], MD;  Location: [facility] END[facility];  Service: Endoscopy;;  . [name]  09/27/2018   Procedure: SPHINCTEROTOMY;  Surgeon: [name], MD;  Location: [facility];  Service: Endoscopy;;  . TOTAL HIP ARTHROPLASTY Left 03/12/2019   Procedure: LEFT TOTAL HIP ARTHROPLASTY ANTERIOR APPROACH;  [name], [name], MD)  . UPPER GASTROINTESTINAL ENDOSCOPY      Current Outpatient Medications  Medication Sig Dispense Refill  . ALPRAZolam (XANAX) 1 MG tablet Take 0.5 mg by mouth at bedtime.   0  . AMBULATORY NON FORMULARY MEDICATION Medication Name: Gi cocktail 5-10 ml every 4-6 hours as needed for pain. 450 mL 1  . amLODipine (NORVASC) 5 MG tablet Take 1 tablet (5 mg total) by mouth daily. 90 tablet 0  . amphetamine-dextroamphetamine (ADDERALL XR) 30 MG 24 hr capsule TAKE 1 CAPSULE BY MOUTH EVERY MORNING 90 capsule 0  . ARIPiprazole (ABILIFY) 5 MG tablet Take 5 mg by mouth at bedtime.     . calcium-vitamin D (OSCAL WITH D) 500-200 MG-UNIT tablet Take 1 tablet by mouth 2 (two) times daily.    . cyanocobalamin (,VITAMIN B-12,) 1000 MCG/ML injection INJECT 1 ML IM EVERY 21 DAYS 3 mL 3  . cyclobenzaprine (FLEXERIL) 5 MG tablet TAKE ONE TABLET BY MOUTH 3 TIMES DAILY AS NEEDED FOR MUSCLE SPASMS 30 tablet 1  . diclofenac sodium (VOLTAREN) 1 % GEL Apply 2-4 grams to affected joint up to 4 times daily. 400 g 4  . diclofenac Sodium (VOLTAREN) 1 % GEL SMARTSIG:2 Topical Every 3 Hours PRN    . Estradiol (YUVAFEM) 10 MCG TABS vaginal tablet Place 1 tablet (10 mcg total) vaginally 3 (three) times a week. Please keep on file. (Patient taking differently: Place 1 tablet vaginally See admin  instructions. Place 1 tablet vaginally Tuesday, Thursday and Saturday.) 12 tablet 10  . Eszopiclone 3 MG TABS TAKE ONE TABLET AT BEDTIME 90 tablet 0  . ferrous sulfate 325 (65 FE) MG tablet Take 325 mg by mouth every Monday, Wednesday, and Friday.     . fluticasone (FLONASE) 50 MCG/ACT nasal spray Place 1 spray into both nostrils daily as needed for allergies. 16 g 5  . lamoTRIgine (LAMICTAL) 100 MG tablet Take 100 mg by mouth daily.    Marland Kitchen LINZESS 290 MCG CAPS capsule TAKE 1 CAPSULE BY MOUTH DAILY 90 capsule 1  . methocarbamol (ROBAXIN) 500 MG tablet Take 1 tablet (500 mg total) by mouth every 6 (six) hours as needed for muscle spasms. 40 tablet 1  . morphine (MSIR) 15 MG tablet  Take 1 tablet (15 mg total) by mouth 3 (three) times daily as needed for moderate pain or severe pain. 90 tablet 0  . MOVANTIK 25 MG TABS tablet TAKE 1 TABLET BY MOUTH DAILY 30 tablet 6  . NONFORMULARY OR COMPOUNDED ITEM Testosterone propionate 2% in white petrolatum, apply small amount once daily for 5 days per week. 60 each 0  . ondansetron (ZOFRAN) 4 MG tablet TAKE ONE TABLET EVERY EIGHT HOURS AS NEEDED FOR NAUSEA / VOMITING 30 tablet 1  . ondansetron (ZOFRAN-ODT) 4 MG disintegrating tablet TAKE 1 TABLET BY MOUTH EVERY 8 HOURS AS NEEDED FOR NAUSEA OR VOMITING 20 tablet 1  . pantoprazole (PROTONIX) 40 MG tablet TAKE 1 TABLET BY MOUTH TWICE DAILY WITH A MEAL 180 tablet 3  . pentosan polysulfate (ELMIRON) 100 MG capsule Take 200 mg by mouth 2 (two) times daily.     . phenazopyridine (PYRIDIUM) 100 MG tablet Take by mouth.    . polyethylene glycol (MIRALAX) 17 g packet Take 17 g by mouth 3 (three) times daily.     . pregabalin (LYRICA) 150 MG capsule TAKE 1 CAPSULE BY MOUTH TWICE DAILY 180 capsule 0  . PREVIDENT 5000 BOOSTER PLUS 1.1 % PSTE See admin instructions.    . senna (SENOKOT) 8.6 MG tablet Take 2 tablets by mouth daily as needed.     . SF 5000 PLUS 1.1 % CREA dental cream Place 1 application onto teeth at bedtime.      . ursodiol (ACTIGALL) 300 MG capsule Take 1 capsule (300 mg total) by mouth 2 (two) times daily. 180 capsule 3   No current facility-administered medications for this visit.    Family History  Problem Relation Age of Onset  . CAD Father 36       MI, nonsmoker  . Esophageal cancer Father 14  . Stomach cancer Father   . Scleroderma Mother   . Hypertension Mother   . Esophageal cancer Paternal Grandfather   . Stomach cancer Paternal Grandfather   . Diabetes Maternal Grandmother   . Arthritis Brother   . Stroke Neg Hx   . Colon cancer Neg Hx   . Rectal cancer Neg Hx     Review of Systems  All other systems reviewed and are negative.   Exam:   BP 140/72   Pulse 90   Resp 14   Ht 5\' 4"  (1.626 m)   Wt 123 lb 12.8 oz (56.2 kg)   LMP 07/25/1998   BMI 21.25 kg/m     General appearance: alert, cooperative and appears stated age Head: normocephalic, without obvious abnormality, atraumatic Neck: no adenopathy, supple, symmetrical, trachea midline and thyroid normal to inspection and palpation Lungs: clear to auscultation bilaterally Breasts: normal appearance, no masses or tenderness, No nipple retraction or dimpling, No nipple discharge or bleeding, No axillary adenopathy Heart: regular rate and rhythm Abdomen: soft, non-tender; no masses, no organomegaly Extremities: extremities normal, atraumatic, no cyanosis or edema Skin: skin color, texture, turgor normal. No rashes or lesions Lymph nodes: cervical, supraclavicular, and axillary nodes normal. Neurologic: grossly normal  Pelvic: External genitalia:  no lesions              No abnormal inguinal nodes palpated.              Urethra:  normal appearing urethra with no masses, tenderness or lesions              Bartholins and Skenes: normal  Vagina: normal appearing vagina with normal color and discharge, no lesions              Cervix: no lesions              Pap taken: No. Bimanual Exam:  Uterus:  normal  size, contour, position, consistency, mobility, non-tender              Adnexa: no mass, fullness, tenderness              Rectal exam: Yes.  .  Confirms.              Anus:  normal sphincter tone, no lesions  Chaperone was present for exam.  Assessment:   Well woman visit with normal exam. Bilateral breast surgery.  Vaginal atrophy.  Decreased libido.  Osteopenia.  Stargardt's disease. Progressive loss of vision.  Plan: Mammogram screening discussed. Self breast awareness reviewed. Pap and HR HPV as above. Guidelines for Calcium, Vitamin D, regular exercise program including cardiovascular and weight bearing exercise. Stop Vagifem and start Intrarosa.  Check testosterone levels.  Will wait to refill prescription of testosterone after lab result is back.  Follow up annually and prn.   After visit summary provided.

## 2020-02-05 ENCOUNTER — Ambulatory Visit (INDEPENDENT_AMBULATORY_CARE_PROVIDER_SITE_OTHER): Payer: Medicare Other | Admitting: Orthopaedic Surgery

## 2020-02-05 ENCOUNTER — Encounter: Payer: Self-pay | Admitting: Orthopaedic Surgery

## 2020-02-05 ENCOUNTER — Other Ambulatory Visit: Payer: Self-pay

## 2020-02-05 DIAGNOSIS — M25561 Pain in right knee: Secondary | ICD-10-CM

## 2020-02-05 DIAGNOSIS — M25562 Pain in left knee: Secondary | ICD-10-CM

## 2020-02-05 DIAGNOSIS — G8929 Other chronic pain: Secondary | ICD-10-CM | POA: Diagnosis not present

## 2020-02-05 NOTE — Progress Notes (Signed)
The patient is well-known to me.  We have replaced her left hip before.  She has been dealing with bilateral knee pain since January of this year with the right knee much worse than the left knee.  She has had steroid injections in these knees already.  X-rays in February were reviewed of both knees and shows only some mild patellofemoral arthritic changes with well-maintained medial lateral compartments of the knees.  She is also been to formal physical therapy for her knees.  Her left knee is much better but her right knee is significantly worse.  On examination of her right knee she cannot fully extend the right knee and it does catch.  I cannot fully extend the knee either.  There is a mild effusion.  The knee is otherwise ligamentously stable but very painful.  Her left knee exam was almost normal today.  At this point given her locking and that right knee combined with the failure of conservative treatment including time, rest, activity modification, ice, anti-inflammatories, steroid injections and formal physical therapy, a MRI is medically warranted for the right knee to rule out a meniscal tear.  Given her plain film still showing well-maintained joint space, the MRI is warranted so we can assess for meniscal tear.  Once we have the MRI she will come back and see [place] for follow-up.  All questions and concerns were answered and addressed.  She agrees with this treatment plan.

## 2020-02-06 ENCOUNTER — Encounter: Payer: Self-pay | Admitting: Obstetrics and Gynecology

## 2020-02-06 ENCOUNTER — Ambulatory Visit (INDEPENDENT_AMBULATORY_CARE_PROVIDER_SITE_OTHER): Payer: Medicare Other | Admitting: Obstetrics and Gynecology

## 2020-02-06 ENCOUNTER — Other Ambulatory Visit: Payer: Self-pay

## 2020-02-06 VITALS — BP 140/72 | HR 90 | Resp 14 | Ht 64.0 in | Wt 123.8 lb

## 2020-02-06 DIAGNOSIS — G8929 Other chronic pain: Secondary | ICD-10-CM

## 2020-02-06 DIAGNOSIS — R6882 Decreased libido: Secondary | ICD-10-CM

## 2020-02-06 DIAGNOSIS — Z01419 Encounter for gynecological examination (general) (routine) without abnormal findings: Secondary | ICD-10-CM | POA: Diagnosis not present

## 2020-02-06 MED ORDER — INTRAROSA 6.5 MG VA INST: 1.0000 | VAGINAL_INSERT | Freq: Every day | VAGINAL | 11 refills | Status: DC

## 2020-02-06 NOTE — Patient Instructions (Signed)

## 2020-02-07 ENCOUNTER — Telehealth: Payer: Self-pay

## 2020-02-07 ENCOUNTER — Encounter: Payer: Self-pay | Admitting: Orthopaedic Surgery

## 2020-02-07 NOTE — Telephone Encounter (Signed)
Patient is calling about a prior authorization for a medication prescribed at her last visit. Patient would like to discuss with nurse.

## 2020-02-10 NOTE — Telephone Encounter (Signed)
PA submitted for Intrarosa 6.5mg  insert, through [email or web address]  KeyOrion Crook - PA Case ID: [id number] - Rx #: [id number]

## 2020-02-10 NOTE — Telephone Encounter (Signed)
Spoke with patient. Requesting PA be submitted for Intrarosa 6.5mg  vag insert for vaginal atrophy. She has tried and failed Estring and vagifem, not effective.   Advised patient I will submit PA to plan, can take up to 5 business days for response. Our office will notify of response once received. Patient agreeable.

## 2020-02-11 LAB — TESTOSTERONE, FREE, DIRECT
Testosterone, Free: 0.3 pg/mL (ref 0.0–4.2)
Testosterone, Total, LC/MS: 49.8 ng/dL — ABNORMAL HIGH (ref 7.0–40.0)

## 2020-02-11 NOTE — Telephone Encounter (Signed)
PA denied for Intrarosa.   Message to Dr. Quincy Simmonds to review and advise.

## 2020-02-12 ENCOUNTER — Other Ambulatory Visit: Payer: Self-pay | Admitting: Obstetrics and Gynecology

## 2020-02-12 MED ORDER — NONFORMULARY OR COMPOUNDED ITEM: 0 refills | Status: DC

## 2020-02-12 NOTE — Telephone Encounter (Signed)
Reviewed with Dr. Quincy Simmonds. Will proceed with appeal. Appeal completed and placed on Dr. Elza Rafter desk for review and signature.

## 2020-02-12 NOTE — Telephone Encounter (Signed)
Spoke with patient, update provided on Intrarosa appeal. Patient agreeable to proceed. She is aware she will be notified of response from plan once received.  Patient wants to add that she has also tried and failed premarin vaginal cream.

## 2020-02-14 ENCOUNTER — Other Ambulatory Visit: Payer: Self-pay | Admitting: Family Medicine

## 2020-02-14 NOTE — Telephone Encounter (Signed)
Too early -denied.

## 2020-02-14 NOTE — Telephone Encounter (Signed)
Appeal reviewed and signed by Dr. Quincy Simmonds.  Appeal faxed to [facility] at [phone].

## 2020-02-14 NOTE — Telephone Encounter (Signed)
Last filled:  02/03/20, #90.  Not able to deny.

## 2020-02-17 ENCOUNTER — Other Ambulatory Visit: Payer: Self-pay | Admitting: Rheumatology

## 2020-02-17 ENCOUNTER — Ambulatory Visit
Admission: RE | Admit: 2020-02-17 | Discharge: 2020-02-17 | Disposition: A | Discharge status: Home / Self Care | Payer: Medicare Other | Source: Ambulatory Visit | Attending: Orthopaedic Surgery | Admitting: Orthopaedic Surgery

## 2020-02-17 ENCOUNTER — Other Ambulatory Visit: Payer: Self-pay

## 2020-02-17 DIAGNOSIS — G8929 Other chronic pain: Secondary | ICD-10-CM

## 2020-02-17 NOTE — Telephone Encounter (Signed)
Last Visit:09/05/19 Next Visit:03/04/20  Okay to refill per Dr. Estanislado Pandy

## 2020-02-18 ENCOUNTER — Other Ambulatory Visit: Payer: Medicare Other

## 2020-02-19 ENCOUNTER — Other Ambulatory Visit: Payer: Medicare Other

## 2020-02-25 ENCOUNTER — Telehealth: Payer: Self-pay | Admitting: Radiology

## 2020-02-25 ENCOUNTER — Encounter: Payer: Self-pay | Admitting: Orthopaedic Surgery

## 2020-02-25 ENCOUNTER — Ambulatory Visit (INDEPENDENT_AMBULATORY_CARE_PROVIDER_SITE_OTHER): Payer: Medicare Other | Admitting: Orthopaedic Surgery

## 2020-02-25 ENCOUNTER — Other Ambulatory Visit: Payer: Self-pay | Admitting: Radiology

## 2020-02-25 DIAGNOSIS — M2241 Chondromalacia patellae, right knee: Secondary | ICD-10-CM | POA: Diagnosis not present

## 2020-02-25 NOTE — Telephone Encounter (Signed)
Right knee supplemental injection 

## 2020-02-25 NOTE — Telephone Encounter (Signed)
Submitted for VOB for synvisc one -right knee

## 2020-02-25 NOTE — Progress Notes (Signed)
HPI: Rachel Duran returns today to go over the MRI of her right knee.  She continues to have right knee pain despite conservative treatment which is included oral NSAIDs, topical NSAIDs injections and physical therapy.  Pain is mostly anterior aspect of the knee.  She states pain becomes worse throughout the day. MRI images are reviewed with the patient knee model diagram was used to describe the anatomy.  MRI dated 02/18/2020 right knee showed moderate patellofemoral osteoarthritis.  Collateral, cruciate ligaments and meniscus intact.  Physical exam: Right knee good range of motion.  Tenderness over the anterior inferior portion of the patella.  She is able do straight leg raise.  No abnormal warmth erythema.  Atrophy of the VMO is present.  Impression: Right knee chondromalacia patella  Plan: Recommend she work on quad strengthening exercises were reviewed with her.  Also try to gain approval for supplemental injection.  She will continue to use Voltaren gel on her knees up to 4 times daily as she is on this short-lived but beneficial.  See her back once injection is approved.  Questions were encouraged and answered by Dr. [name] and myself.

## 2020-02-26 ENCOUNTER — Telehealth: Payer: Self-pay

## 2020-02-26 NOTE — Telephone Encounter (Signed)
Approved for Synvisc One-Right knee Dr. Margarito Liner and Bill Covered @ 100% No prior auth required

## 2020-02-27 NOTE — Telephone Encounter (Signed)
Call placed to [facility] at [phone] spoke to [facility]. Was advised appeal not received. Confirmed fax number for standard appeal. Was advised to contact expedited appeal line at [phone] to provide a verbal appeal.    Call placed to appeal line number provided, spoke with 3 representatives, was advised appeal could not be submitted by phone, has to be faxed to mailed to fax on the PA response letter. Length of call 35 min.   Appeal faxed to [phone], requested expedited appeal. Fax confirmed.    Update provided to patient. Patient thankful for call.

## 2020-02-27 NOTE — Telephone Encounter (Signed)
Lvm for pt to call back to schedule...Marland Kitchenok to schedule @ next available

## 2020-02-27 NOTE — Telephone Encounter (Signed)
Patient is returning call to Jill. °

## 2020-03-02 ENCOUNTER — Telehealth: Payer: Self-pay | Admitting: Obstetrics and Gynecology

## 2020-03-02 MED ORDER — INTRAROSA 6.5 MG VA INST: 1.0000 | VAGINAL_INSERT | Freq: Every day | VAGINAL | 3 refills | Status: DC

## 2020-03-02 NOTE — Telephone Encounter (Signed)
See 03/02/20 telephone encounter. Appeal approved.

## 2020-03-02 NOTE — Telephone Encounter (Signed)
Spoke with patient.  Patient states [employer] notified her on 02/28/20 that Rachel Duran appeal was approved, valid until 07/24/20. Patient is requesting the RX to be sent to mail order pharmacy for day supply. RX sent to verified pharmacy. Patient thankful for assistance, will provide update on medication in a few weeks.   Routing to provider for final review. Patient is agreeable to disposition. Will close encounter.

## 2020-03-02 NOTE — Telephone Encounter (Signed)
Patient calling to speak with Sharee Pimple regarding medication approval she received.

## 2020-03-03 ENCOUNTER — Other Ambulatory Visit: Payer: Self-pay | Admitting: Family Medicine

## 2020-03-03 NOTE — Telephone Encounter (Signed)
Last office visit 02/03/2020 for chronic pain management.  Last refilled 12/09/2019 for #90 with no refills. UDS/Contract 04/19/2019.  Next Appt: 05/05/2020 for 3 month follow up.

## 2020-03-03 NOTE — Telephone Encounter (Signed)
ERx 

## 2020-03-04 ENCOUNTER — Ambulatory Visit: Payer: Medicare Other | Admitting: Rheumatology

## 2020-03-04 NOTE — Progress Notes (Signed)
Office Visit Note  Patient: Rachel Duran             Date of Birth: 1956/07/24           MRN: [id number]             PCP: Rachel Bush, MD Referring: Rachel Bush, MD Visit Date: 03/17/2020 Occupation: @GUAROCC @  Subjective:  [name]   History of Present Illness: Rachel Duran is a 64 y.o. female with history of osteoarthritis, fibromyalgia, and DDD.  She reports that she has been experiencing worsening symptoms of [name] this past summer.  She states that typically her symptoms are worse during the wintertime but she has had more frequent and severe symptoms over the past several months.  She denies any digital ulcerations.  She is currently taking Norvasc 5 mg 1 tablet by mouth daily and would like to discuss increasing the dose.  She denies any increase skin tightening or skin thickening.  She denies any recent rashes. She reports that she continues to have persistent pain in the right knee joint.  She states that she was evaluated by Dr. [name] and proceeded with an MRI of the right knee which revealed moderate patellofemoral osteoarthritis.  She had a Synvisc 1 injection on 03/10/2020 but has not noticed any clinical improvement yet.  She states that she will be following up with Dr. [name] in 6 weeks and if her symptoms persist they will discuss her referral for a partial right knee replacement.  She states that she has extreme difficulty climbing steps due to the severity of pain.  She states her discomfort was exacerbated when she went to physical therapy.  She has been using Voltaren gel 4 times daily.  She continues to have chronic neck and lower back pain.  She is not experiencing any symptoms of radiculopathy at this time.  She states that her left hip replacement is doing well but she continues to have intermittent trochanter bursitis especially on the right side.  She states that overall her fibromyalgia has been well controlled.  She states that she  has not been as active recently due to the severity of her right knee joint pain and is worried that her fibromyalgia will progress.   Activities of Daily Living:  Patient reports morning stiffness for 2 hours.   Patient Reports nocturnal pain.  Difficulty dressing/grooming: Reports Difficulty climbing stairs: Reports Difficulty getting out of chair: Reports Difficulty using hands for taps, buttons, cutlery, and/or writing: Reports  Review of Systems  Constitutional: Positive for fatigue.  HENT: Positive for mouth dryness. Negative for mouth sores and nose dryness.   Eyes: Positive for dryness. Negative for pain and visual disturbance.  Respiratory: Negative for cough, hemoptysis, shortness of breath and difficulty breathing.   Cardiovascular: Negative for chest pain, palpitations, hypertension and swelling in legs/feet.  Gastrointestinal: Positive for constipation. Negative for blood in stool and diarrhea.  Endocrine: Negative for increased urination.  Genitourinary: Negative for difficulty urinating and painful urination.  Musculoskeletal: Positive for arthralgias, joint pain, muscle weakness, morning stiffness and muscle tenderness. Negative for joint swelling, myalgias and myalgias.  Skin: Negative for color change, pallor, rash, hair loss, nodules/bumps, skin tightness, ulcers and sensitivity to sunlight.  Neurological: Negative for dizziness, numbness and headaches.  Hematological: Negative for bruising/bleeding tendency and swollen glands.  Psychiatric/Behavioral: Positive for sleep disturbance. Negative for depressed mood. The patient is not nervous/anxious.     PMFS History:  Patient Active Problem List   Diagnosis Date  Noted  . Positive ANA (antinuclear antibody) 06/05/2019  . 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  . Unilateral primary osteoarthritis, left hip 01/28/2019  . Pelvic pain 12/12/2018  . Common bile duct (CBD) obstruction   . Elevated alkaline  phosphatase level   . Venous insufficiency of left lower extremity 07/04/2018  . Dyslipidemia 09/22/2017  . DDD (degenerative disc disease), cervical 04/03/2017  . DDD (degenerative disc disease), lumbar 04/03/2017  . Onychomycosis 03/21/2017  . Dysphagia 10/18/2016  . Encounter for chronic pain management 10/18/2016  . Biliary stasis 09/26/2016  . Fever blister 08/18/2016  . Urinary urgency 07/05/2016  . HNP (herniated nucleus pulposus), lumbar 09/23/2015  . Sinus congestion 07/30/2015  . Iron deficiency 07/30/2015  . Health maintenance examination 06/15/2015  . 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 Headache 03/09/2015  . Clavicle enlargement 12/13/2014  . Prediabetes 12/13/2014  . Plantar fasciitis, bilateral 09/11/2014  . Advanced care planning/counseling discussion 06/10/2014  . Medicare annual wellness visit, subsequent 06/10/2014  . Abnormal thyroid function test 06/10/2014  . Chronic pain syndrome 06/10/2014  . CFS (chronic fatigue syndrome) 04/08/2014  . Postmenopausal atrophic vaginitis 10/19/2012  . 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 Cervical disc disorder with radiculopathy of cervical region 05/28/2010  . CHEST PAIN UNSPECIFIED 02/28/2008  . APHTHOUS ULCERS 01/31/2008  . Chronic insomnia 11/09/2007  . Drug-induced constipation 10/11/2007  . Allergic rhinitis 04/12/2007  . MDD (major depressive disorder), recurrent episode, moderate ([place]) 03/05/2007  . [name] syndrome 03/05/2007  . GERD 03/05/2007  . ROSACEA 03/05/2007  . NEURALGIA 03/05/2007  . 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 Chronic interstitial cystitis 12/06/2006  . Fibromyalgia 12/06/2006    Past Medical History:  Diagnosis Date  . Abdominal pain last 4 months   and nausea also  . Allergy   . Anemia    history of  . Anxiety   . Bipolar disorder ([place])    atpical bipolar disorder  . 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 C6- C7 -hx. past fusion(bone  graft used)  . Cholecystitis   . Chronic pain   . DDD (degenerative disc disease), lumbar 09/2015   dextroscoliosis with multilevel DDD and facet arthrosis most notable for R foraminal disc protrusion L4/5 producing severe R neural foraminal stenosis abutting R L4 nerve root, moderate spinal canal and mild lat recesss and R neural foraminal stenosis L3/4 (MRI)  . Depression    bipolar depression  . Disorders of porphyrin metabolism   . Felon of finger of left hand 11/10/2016  . Fibromyalgia   . GERD (gastroesophageal reflux disease)   . Headache    occasionally  . Internal hemorrhoids   . Interstitial cystitis 06-06-12   hx.  . Irritable bowel syndrome   . PONV (postoperative nausea and vomiting)    now uses stomach blockers and no ponv  . 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 Raynauds disease    hx.  . Seronegative arthritis    [name]  . 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 CAD Father 25       MI, nonsmoker  . Esophageal cancer Father 5  . Stomach cancer Father   . Scleroderma Mother   .  Hypertension Mother   . Esophageal cancer Paternal Grandfather   . Stomach cancer Paternal Grandfather   . Diabetes Maternal Grandmother   . Arthritis Brother   . Stroke Neg Hx   . Colon cancer Neg Hx   . Rectal cancer Neg Hx    Past Surgical History:  Procedure Laterality Date  . ANTERIOR CERVICAL DECOMP/DISCECTOMY FUSION  2004   C5/6, C6/7  . ANTERIOR CERVICAL DECOMP/DISCECTOMY FUSION  02/2016   C3/4, C4/5 with plating [name])  . AUGMENTATION MAMMAPLASTY Bilateral 03/25/2010  . BREAST ENHANCEMENT SURGERY  2010  . BREAST IMPLANT EXCHANGE  10/2014   exchange saline implants, B mastopexy/capsulorraphy ([facility])  . BUNIONECTOMY  Bilateral yrs ago  . [place]   x 1  . CHOLECYSTECTOMY  06/11/2012   Procedure: LAPAROSCOPIC CHOLECYSTECTOMY WITH INTRAOPERATIVE CHOLANGIOGRAM;  Surgeon: [name], MD,FACS;  Location: [facility];  Service: General;  Laterality: N/A;  . COLONOSCOPY  02/2018   done for positive cologuard - WNL, rpt 10 yrs Fuller Plan)  . CYSTOSCOPY    . ERCP  05/22/2012   Procedure: ENDOSCOPIC RETROGRADE CHOLANGIOPANCREATOGRAPHY (ERCP);  Surgeon: [name], MD,FACG;  Location: [facility];  Service: Endoscopy;  Laterality: N/A;  . ERCP N/A 09/17/2013   Procedure: ENDOSCOPIC RETROGRADE CHOLANGIOPANCREATOGRAPHY (ERCP);  Surgeon: [name], MD;  Location: [facility];  Service: Endoscopy;  Laterality: N/A;  . ERCP N/A 09/27/2018   Procedure: ENDOSCOPIC RETROGRADE CHOLANGIOPANCREATOGRAPHY (ERCP);  Surgeon: [name], MD;  Location: [facility];  Service: Endoscopy;  Laterality: N/A;  . ESOPHAGOGASTRODUODENOSCOPY  09/2016   WNL. esophagus dilated Fuller Plan)  . HEMORRHOID BANDING  09-23-13   --Dr. Greer Pickerel  . HERNIA REPAIR     inguinal  . HYSTEROSCOPY W/ ENDOMETRIAL ABLATION    . NASAL SINUS SURGERY     x5  . REMOVAL OF STONES  09/27/2018   Procedure: REMOVAL OF STONES;  Surgeon: [name], MD;  Location: [facility];  Service: Endoscopy;;  . [name]  09/27/2018   Procedure: SPHINCTEROTOMY;  Surgeon: [name], MD;  Location: [facility];  Service: Endoscopy;;  . TOTAL HIP ARTHROPLASTY Left 03/12/2019   Procedure: LEFT TOTAL HIP ARTHROPLASTY ANTERIOR APPROACH;  [name], [name], MD)  . 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 Influenza Split 05/04/2011, 04/04/2012  . Influenza Whole 05/25/2007, 04/28/2008, 04/30/2009, 03/26/2010  . Influenza,inj,Quad PF,6+ Mos 04/16/2015, 04/15/2016, 05/16/2017, 04/10/2018, 04/19/2019  . Influenza-Unspecified 03/25/2014  . MMR 11/28/2017  . Moderna SARS-COVID-2 Vaccination 09/02/2019, 09/30/2019  . Pneumococcal Conjugate-13 08/02/2013  . Pneumococcal Polysaccharide-23 07/30/2009  . Td 11/01/2005, 11/10/2016  . Zoster Recombinat (Shingrix) 07/21/2017, 11/03/2017     Objective: Vital Signs: BP (!) 153/83 (BP Location: Left Arm, Patient Position: Sitting, Cuff Size: Small)   Pulse 92   Resp 12   Ht 5' 4"  (1.626 m)   Wt 129 lb 3.2 oz (58.6 kg)   LMP 07/25/1998   BMI 22.18 kg/m    Physical Exam Vitals and nursing note reviewed.  Constitutional:      Appearance:  She is well-developed.  HENT:     Head: Normocephalic and atraumatic.  Eyes:     Conjunctiva/sclera: Conjunctivae normal.  Pulmonary:     Effort: Pulmonary effort is normal.  Abdominal:     Palpations: Abdomen is soft.  Musculoskeletal:     Cervical back: Normal range of motion.  Skin:    General: Skin is warm and dry.     Capillary Refill: Capillary refill takes less than 2 seconds. Nailbed capillary dilation noted    Comments: Skin thickening noted distal to PIP joints-both hands and both feet.  No digital ulcerations or signs of gangrene noted.  Nailbed capillary dilation noted.   Neurological:     Mental Status: She is alert and oriented to person, place, and time.  Psychiatric:        Behavior: Behavior normal.      Musculoskeletal Exam: C-spine limited ROM. Thoracolumbar scoliosis.  Midline spinal tenderness in the thoracic spine.  Trapezius muscle tension and tenderness bilaterally. Shoulder joints, elbow joints, wrist joints, MCPs, PIPs, and DIPs good ROM with no synovitis.  Complete fist formation bilaterally.  Left hip  replacement has good ROM.  Right hip good ROM with no discomfort. Right knee has limited extension with discomfort.  Right knee crepitus noted.  Left knee has good ROM with no warmth or effusion.  Ankle joints good ROM with no tenderness of inflammation.  Tenderness of the right 1st MTP joint.    CDAI Exam: CDAI Score: -- Patient Global: --; Provider Global: -- Swollen: --; Tender: -- Joint Exam 03/17/2020   No joint exam has been documented for this visit   There is currently no information documented on the homunculus. Go to the Rheumatology activity and complete the homunculus joint exam.  Investigation: No additional findings.  Imaging: MR Knee Right w/o contrast  Result Date: 02/18/2020 CLINICAL DATA:  Anterior right knee pain EXAM: MRI OF THE RIGHT KNEE WITHOUT CONTRAST TECHNIQUE: Multiplanar, multisequence MR imaging of the knee was performed. No intravenous contrast was administered. COMPARISON:  X-ray 09/05/2019 FINDINGS: MENISCI Medial meniscus:  Intact. Lateral meniscus:  Intact. LIGAMENTS Cruciates:  Intact ACL and PCL. Collaterals: Medial collateral ligament is intact. Lateral collateral ligament complex is intact. CARTILAGE Patellofemoral: Diffuse patellar chondral thinning with full-thickness cartilage loss and subchondral cystic changes at the lateral patellar facet and patellar apex (series 3, image 9) mild trochlear chondral thinning. Medial:  No chondral defect. Lateral:  No chondral defect. Joint:  No joint effusion.  Unremarkable fat pads. Popliteal Fossa: Trace fluid within a Baker's cyst. Intact popliteus tendon. Extensor Mechanism:  Intact quadriceps tendon and patellar tendon. Bones: Degenerative subchondral marrow signal changes at the patellar apex. Bone marrow signal is otherwise within normal limits. No fracture. No malalignment. No suspicious bony lesion. Other: Faint edema within the distal vastus medialis muscle. No soft tissue fluid collection. IMPRESSION: 1.  Moderate patellofemoral osteoarthritis. 2. Faint edema within the distal vastus medialis muscle, which may reflect muscle strain. 3. Intact menisci, cruciate and collateral ligaments. Electronically Signed   By: Davina Poke D.O.   On: 02/18/2020 08:40    Recent Labs: Lab Results  Component Value Date   WBC 7.8 02/03/2020   HGB 12.4 02/03/2020   PLT 307.0 02/03/2020   NA 133 (L) 02/03/2020   K 4.8 02/03/2020   CL 97 02/03/2020   CO2 30 02/03/2020   GLUCOSE 103 (H) 02/03/2020   BUN 6 02/03/2020   CREATININE 0.60 02/03/2020   BILITOT 0.4 02/03/2020  ALKPHOS 79 02/03/2020   AST 20 02/03/2020   ALT 14 02/03/2020   PROT 6.6 02/03/2020   ALBUMIN 4.4 02/03/2020   CALCIUM 9.4 02/03/2020   GFRAA >60 03/13/2019    Speciality Comments: No specialty comments available.  Procedures:  No procedures performed Allergies: Inh [isoniazid], Cymbalta [duloxetine hcl], Nucynta [tapentadol], Silenor [doxepin hcl], Benzoin, Fentanyl, Celebrex [celecoxib], Codeine phosphate, Lithium, Meperidine hcl, and Sulfamethoxazole   Assessment / Plan:     Visit Diagnoses: Primary osteoarthritis of both knees -  X-rays show mild to moderate osteoarthritis and severe chondromalacia patella: She has persistent pain in her right knee joint.  She was evaluated by Dr. [name] and had an MRI of the right knee on 02/17/2020 which revealed severe patellofemoral osteoarthritis.  She had a Synvisc 1 injection performed by Dr. [name] on 03/10/2020.  If she does not have a response to the Visco gel injection she will be proceeding with a partial knee replacement.  She was encouraged to continue to use Voltaren gel topically as needed for pain relief.  We also discussed the importance of lower extremity muscle strengthening.  She previously went to physical therapy which exacerbated her discomfort.  She has limited extension of the right knee joint but no warmth or effusion was noted.  She has bilateral knee crepitus on  exam. She is not having any discomfort in the left knee joint at this time.  She has good range of motion with no warmth or effusion. She plans on continuing to follow-up with Dr. [name] and if she will require a partial knee replacement she will be referred elsewhere.  Primary osteoarthritis of both hands: She has PIP and DIP thickening consistent with osteoarthritis of both hands.  We discussed the importance of joint protection and muscle strengthening.  S/P hip replacement, left: Performed by Dr. [name].  Doing well.  She has good range of motion with no discomfort.  Trochanteric bursitis of both hips: She experiences intermittent discomfort due to trochanteric bursitis bilaterally.  She has tenderness to palpation on exam today.  She was encouraged to perform stretching exercises daily.  [name] disease without gangrene: She has been experiencing worsening symptoms of [name] over the past several months.  Her symptoms have been more frequent and more severe.  She has not noticed any digital ulcerations or signs of gangrene.  On examination capillary dilation was noted.  She has skin tightness and thickening distal to her PCP joints.  She has a known family history of scleroderma-mother so we have tried to monitor her symptoms closely.  She is not exhibiting any other features of scleroderma.  She has a history of positive ANA with negative titer.She will have repeat AVISE lab work drawn in October 2021.  She has been taking Norvasc 5 mg 1 tablet by mouth daily.  She will be increasing Norvasc to 7.5 mg daily.    She was advised to monitor her blood pressure closely after increasing the dose of Norvasc.  She was advised to notify us if she develops any new or worsening symptoms.   DDD (degenerative disc disease), cervical: s/p fusion x2: She has limited range of motion on exam, especially with lateral rotation.  She has no symptoms of radiculopathy at this time. She has ongoing trapezius  muscle tension and tenderness.    DDD (degenerative disc disease), lumbar: History of scoliosis apparent on exam today.  Chronic pain.  No symptoms of radiculopathy.  Fibromyalgia: She experiences intermittent myalgias and muscle tenderness due to  underlying fibromyalgia.  She takes Robaxin 500 mg every 6 hours as needed for muscle spasms and Flexeril 5 mg 3 times daily as needed for muscle spasms.  We discussed the importance of regular exercise and good sleep hygiene.  She has been having difficulty exercising due to the severity of her right knee joint pain.  CFS (chronic fatigue syndrome): Chronic but stable.   Chronic pain syndrome  Other medical conditions are listed as follows:  Prediabetes  History of bipolar disorder  Hx of porphyria  History of gastroesophageal reflux (GERD)  Orders: No orders of the defined types were placed in this encounter.  No orders of the defined types were placed in this encounter.   Face-to-face time spent with patient was 30 minutes. Greater than 50% of time was spent in counseling and coordination of care.  Follow-Up Instructions: Return in about 6 months (around 09/17/2020) for Osteoarthritis, DDD, Fibromyalgia.   Ofilia Neas, PA-C  Note - This record has been created using Dragon software.  Chart creation errors have been sought, but may not always  have been located. Such creation errors do not reflect on  the standard of medical care.

## 2020-03-10 ENCOUNTER — Encounter: Payer: Self-pay | Admitting: Orthopaedic Surgery

## 2020-03-10 ENCOUNTER — Ambulatory Visit (INDEPENDENT_AMBULATORY_CARE_PROVIDER_SITE_OTHER): Payer: Medicare Other | Admitting: Orthopaedic Surgery

## 2020-03-10 DIAGNOSIS — M25561 Pain in right knee: Secondary | ICD-10-CM | POA: Diagnosis not present

## 2020-03-10 DIAGNOSIS — M1711 Unilateral primary osteoarthritis, right knee: Secondary | ICD-10-CM

## 2020-03-10 DIAGNOSIS — G8929 Other chronic pain: Secondary | ICD-10-CM

## 2020-03-10 MED ORDER — HYLAN G-F 20 48 MG/6ML IX SOSY
48.0000 mg | PREFILLED_SYRINGE | INTRA_ARTICULAR | Status: AC | PRN
  Administered 2020-03-10: 48 mg via INTRA_ARTICULAR

## 2020-03-10 NOTE — Progress Notes (Signed)
   Procedure Note  Patient: Rachel Duran             Date of Birth: Jul 03, 1956           MRN: [id number]             Visit Date: 03/10/2020  Procedures: Visit Diagnoses:  1. Chronic pain of right knee   2. Unilateral primary osteoarthritis, right knee     Large Joint Inj: R knee on 03/10/2020 8:50 AM Indications: pain and diagnostic evaluation Details: 22 G 1.5 in needle, superolateral approach  Arthrogram: No  Medications: 48 mg Hylan 48 MG/6ML Outcome: tolerated well, no immediate complications Procedure, treatment alternatives, risks and benefits explained, specific risks discussed. Consent was given by the patient. Immediately prior to procedure a time out was called to verify the correct patient, procedure, equipment, support staff and site/side marked as required. Patient was prepped and draped in the usual sterile fashion.    The patient is here today for scheduled Synvisc 1 injection with hyaluronic in the right knee to treat the pain from osteoarthritis that she has this quite severe at the patellofemoral joint.  The remainder of her knee looks good but it is significant cartilage loss of the undersurface of patella in the trochlear groove.  There is detriment affecting her activity living and her quality of life.  She gets a sharp pain underneath her kneecap.  She has tried and failed other forms of conservative treatment including steroid injections.  I explained in detail what hyaluronic acid involves and the risk and benefits of this type injections.  On examination of her right knee today, there is no effusion.  There is grinding of the patellofemoral joint with no instability of the knee ligamentously.  I did place Synvisc 1 in the right knee today without difficulty.  All question concerns were answered and addressed.  We will see her back in about 6 weeks.  If she does not get a response from this, we will consider sending her to one of my partners to consider a  patellofemoral replacement.

## 2020-03-17 ENCOUNTER — Encounter: Payer: Self-pay | Admitting: Physician Assistant

## 2020-03-17 ENCOUNTER — Other Ambulatory Visit: Payer: Self-pay

## 2020-03-17 ENCOUNTER — Other Ambulatory Visit: Payer: Self-pay | Admitting: Physician Assistant

## 2020-03-17 ENCOUNTER — Ambulatory Visit (INDEPENDENT_AMBULATORY_CARE_PROVIDER_SITE_OTHER): Payer: Medicare Other | Admitting: Physician Assistant

## 2020-03-17 VITALS — BP 153/83 | HR 92 | Resp 12 | Ht 64.0 in | Wt 129.2 lb

## 2020-03-17 DIAGNOSIS — M19041 Primary osteoarthritis, right hand: Secondary | ICD-10-CM | POA: Diagnosis not present

## 2020-03-17 DIAGNOSIS — M5030 Other cervical disc degeneration, unspecified cervical region: Secondary | ICD-10-CM

## 2020-03-17 DIAGNOSIS — Z8719 Personal history of other diseases of the digestive system: Secondary | ICD-10-CM

## 2020-03-17 DIAGNOSIS — R7303 Prediabetes: Secondary | ICD-10-CM

## 2020-03-17 DIAGNOSIS — G894 Chronic pain syndrome: Secondary | ICD-10-CM

## 2020-03-17 DIAGNOSIS — M170 Bilateral primary osteoarthritis of knee: Secondary | ICD-10-CM | POA: Diagnosis not present

## 2020-03-17 DIAGNOSIS — R5382 Chronic fatigue, unspecified: Secondary | ICD-10-CM

## 2020-03-17 DIAGNOSIS — Z8659 Personal history of other mental and behavioral disorders: Secondary | ICD-10-CM

## 2020-03-17 DIAGNOSIS — Z96642 Presence of left artificial hip joint: Secondary | ICD-10-CM

## 2020-03-17 DIAGNOSIS — M19042 Primary osteoarthritis, left hand: Secondary | ICD-10-CM

## 2020-03-17 DIAGNOSIS — M7061 Trochanteric bursitis, right hip: Secondary | ICD-10-CM | POA: Diagnosis not present

## 2020-03-17 DIAGNOSIS — I7300 Raynaud's syndrome without gangrene: Secondary | ICD-10-CM

## 2020-03-17 DIAGNOSIS — M51369 Other intervertebral disc degeneration, lumbar region without mention of lumbar back pain or lower extremity pain: Secondary | ICD-10-CM

## 2020-03-17 DIAGNOSIS — Z8639 Personal history of other endocrine, nutritional and metabolic disease: Secondary | ICD-10-CM

## 2020-03-17 DIAGNOSIS — M7062 Trochanteric bursitis, left hip: Secondary | ICD-10-CM

## 2020-03-17 DIAGNOSIS — M797 Fibromyalgia: Secondary | ICD-10-CM

## 2020-03-17 DIAGNOSIS — M5136 Other intervertebral disc degeneration, lumbar region: Secondary | ICD-10-CM

## 2020-03-17 DIAGNOSIS — G9332 Myalgic encephalomyelitis/chronic fatigue syndrome: Secondary | ICD-10-CM

## 2020-03-19 ENCOUNTER — Other Ambulatory Visit: Payer: Self-pay | Admitting: Rheumatology

## 2020-03-19 NOTE — Telephone Encounter (Signed)
Patient called stating at her appointment on Tuesday, 03/17/20 Dr. Estanislado Pandy increased her dosage of Norvasc from 1 tablet to 1 1/2 tablets.  Patient states she called [facility] and was told they haven't received the prescription.  Patient is requesting a return call.

## 2020-03-20 MED ORDER — AMLODIPINE BESYLATE 5 MG PO TABS
7.5000 mg | ORAL_TABLET | Freq: Every day | ORAL | 1 refills | Status: DC
Start: 2020-03-20 — End: 2020-05-19

## 2020-03-20 NOTE — Telephone Encounter (Signed)
Last visit: 03/17/2020 Next Visit: 09/17/2020  She has been taking Norvasc 5 mg 1 tablet by mouth daily.  She will be increasing Norvasc to 7.5 mg daily.     Okay to refill Norvasc?

## 2020-03-20 NOTE — Telephone Encounter (Signed)
Patient advised prescription has been sent to the pharmacy.  

## 2020-03-27 ENCOUNTER — Other Ambulatory Visit: Payer: Self-pay | Admitting: Family Medicine

## 2020-03-27 ENCOUNTER — Encounter: Payer: Self-pay | Admitting: Rheumatology

## 2020-03-27 DIAGNOSIS — Z8269 Family history of other diseases of the musculoskeletal system and connective tissue: Secondary | ICD-10-CM

## 2020-03-27 DIAGNOSIS — I7300 Raynaud's syndrome without gangrene: Secondary | ICD-10-CM

## 2020-03-27 NOTE — Telephone Encounter (Signed)
Name of Medication: Morphine Name of Pharmacy: [facility] or Written Date and Quantity: 02/14/20, #90 Last Office Visit and Type: 02/03/20, 3 mo chronic pain f/u Next Office Visit and Type: 05/05/20, 3 mo chronic pain f/u Last Controlled Substance Agreement Date: 04/19/19 Last UDS: 04/19/19

## 2020-03-27 NOTE — Telephone Encounter (Signed)
Ok to schedule appointment with Dr. Estanislado Pandy once AVISE lab work has returned.   Please place referral to pulmonologist to rule out ILD/pulmonary involvement secondary to possible scleroderma.

## 2020-03-27 NOTE — Telephone Encounter (Signed)
ERx 

## 2020-04-01 ENCOUNTER — Other Ambulatory Visit: Payer: Self-pay | Admitting: Family Medicine

## 2020-04-01 NOTE — Telephone Encounter (Signed)
I do not have security to deny.  Refill sent 03/27/20, #90/0.

## 2020-04-02 NOTE — Telephone Encounter (Signed)
Denied request

## 2020-04-14 ENCOUNTER — Other Ambulatory Visit: Payer: Self-pay | Admitting: Family Medicine

## 2020-04-17 ENCOUNTER — Other Ambulatory Visit: Payer: Self-pay

## 2020-04-17 ENCOUNTER — Encounter: Payer: Self-pay | Admitting: Family Medicine

## 2020-04-17 ENCOUNTER — Telehealth (INDEPENDENT_AMBULATORY_CARE_PROVIDER_SITE_OTHER): Payer: Medicare Other | Admitting: Family Medicine

## 2020-04-17 VITALS — BP 124/78 | HR 76 | Temp 97.2°F | Wt 124.0 lb

## 2020-04-17 DIAGNOSIS — H00015 Hordeolum externum left lower eyelid: Secondary | ICD-10-CM | POA: Diagnosis not present

## 2020-04-17 MED ORDER — ERYTHROMYCIN 5 MG/GM OP OINT: 1.0000 "application " | TOPICAL_OINTMENT | Freq: Every day | OPHTHALMIC | 0 refills | Status: DC

## 2020-04-17 NOTE — Patient Instructions (Signed)
No associated conjunctivitis or involvement in periorbital space.  Treat with  warm compresses  3-4 times daily and topical erythromycin.  Call if not resolving in 2 weeks or if increasing pain or new vision changes.   Stye  A stye, also known as a hordeolum, is a bump that forms on an eyelid. It may look like a pimple next to the eyelash. A stye can form inside the eyelid (internal stye) or outside the eyelid (external stye). A stye can cause redness, swelling, and pain on the eyelid. Styes are very common. Anyone can get them at any age. They usually occur in just one eye, but you may have more than one in either eye. What are the causes? A stye is caused by an infection. The infection is almost always caused by bacteria called Staphylococcus aureus. This is a common type of bacteria that lives on the skin. An internal stye may result from an infected oil-producing gland inside the eyelid. An external stye may be caused by an infection at the base of the eyelash (hair follicle). What increases the risk? You are more likely to develop a stye if:  You have had a stye before.  You have any of these conditions: ? Diabetes. ? Red, itchy, inflamed eyelids (blepharitis). ? A skin condition such as seborrheic dermatitis or rosacea. ? High fat levels in your blood (lipids). What are the signs or symptoms? The most common symptom of a stye is eyelid pain. Internal styes are more painful than external styes. Other symptoms may include:  Painful swelling of your eyelid.  A scratchy feeling in your eye.  Tearing and redness of your eye.  Pus draining from the stye. How is this diagnosed? Your health care provider may be able to diagnose a stye just by examining your eye. The health care provider may also check to make sure:  You do not have a fever or other signs of a more serious infection.  The infection has not spread to other parts of your eye or areas around your eye. How is this  treated? Most styes will clear up in a few days without treatment or with warm compresses applied to the area. You may need to use antibiotic drops or ointment to treat an infection. In some cases, if your stye does not heal with routine treatment, your health care provider may drain pus from the stye using a thin blade or needle. This may be done if the stye is large, causing a lot of pain, or affecting your vision. Follow these instructions at home:  Take over-the-counter and prescription medicines only as told by your health care provider. This includes eye drops or ointments.  If you were prescribed an antibiotic medicine, apply or use it as told by your health care provider. Do not stop using the antibiotic even if your condition improves.  Apply a warm, wet cloth (warm compress) to your eye for 5-10 minutes, 4 times a day.  Clean the affected eyelid as directed by your health care provider.  Do not wear contact lenses or eye makeup until your stye has healed.  Do not try to pop or drain the stye.  Do not rub your eye. Contact a health care provider if:  You have chills or a fever.  Your stye does not go away after several days.  Your stye affects your vision.  Your eyeball becomes swollen, red, or painful. Get help right away if:  You have pain when moving your  eye around. Summary  A stye is a bump that forms on an eyelid. It may look like a pimple next to the eyelash.  A stye can form inside the eyelid (internal stye) or outside the eyelid (external stye). A stye can cause redness, swelling, and pain on the eyelid.  Your health care provider may be able to diagnose a stye just by examining your eye.  Apply a warm, wet cloth (warm compress) to your eye for 5-10 minutes, 4 times a day. This information is not intended to replace advice given to you by your health care provider. Make sure you discuss any questions you have with your health care provider. Document Revised:  06/23/2017 Document Reviewed: 03/23/2017 Elsevier Patient Education  2020 [employer].

## 2020-04-17 NOTE — Progress Notes (Signed)
VIRTUAL VISIT Due to national recommendations of social distancing due to [place] 19, a virtual visit is felt to be most appropriate for this patient at this time.   I connected with the patient on 04/17/20 at 10:00 AM EDT by virtual telehealth platform and verified that I am speaking with the correct person using two identifiers.   I discussed the limitations, risks, security and privacy concerns of performing an evaluation and management service by  virtual telehealth platform and the availability of in person appointments. I also discussed with the patient that there may be a patient responsible charge related to this service. The patient expressed understanding and agreed to proceed.  Patient location: Home Provider Location: [facility] Participants: Berdina Cheever Diona Browner and Denyce Harr   Chief Complaint  Patient presents with  . Eye Pain    pt noticed redness and bump on left eye this morning.  Pt states tender to the touch and hurt when she blinks.    History of Present Illness:  64 year old female presents with new onset left eye pain starting this AM.  tender over left lower lid, red bump on inner lower lid.   No redness in conjunctiva. No discharge. No eye itchiness.  Pain with blinking.  No vision change.   No cough, no congestion, no fever/chills.  Normal po.   She wears contacts.  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  S/P COVID vaccine.  COVID 19 screen No recent travel or known exposure to COVID19 The patient denies respiratory symptoms of COVID 19 at this time.  The importance of social distancing was discussed today.   Review of Systems  Constitutional: Negative for chills and fever.  HENT: Negative for congestion and ear pain.   Eyes: Positive for pain. Negative for redness.  Respiratory: Negative for cough and shortness of breath.   Cardiovascular: Negative for chest pain, palpitations and leg swelling.  Gastrointestinal: Negative  for abdominal pain, blood in stool, constipation, diarrhea, nausea and vomiting.  Genitourinary: Negative for dysuria.  Musculoskeletal: Negative for falls and myalgias.  Skin: Negative for rash.  Neurological: Negative for dizziness.  Psychiatric/Behavioral: Negative for depression. The patient is not nervous/anxious.       Past Medical History:  Diagnosis Date  . Abdominal pain last 4 months   and nausea also  . Allergy   . Anemia    history of  . Anxiety   . Bipolar disorder ([place])    atpical bipolar disorder  . 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 C6- C7 -hx. past fusion(bone graft used)  . Cholecystitis   . Chronic pain   . DDD (degenerative disc disease), lumbar 09/2015   dextroscoliosis with multilevel DDD and facet arthrosis most notable for R foraminal disc protrusion L4/5 producing severe R neural foraminal stenosis abutting R L4 nerve root, moderate spinal canal and mild lat recesss and R neural foraminal stenosis L3/4 (MRI)  . Depression    bipolar depression  . Disorders of porphyrin metabolism   . Felon of finger of left hand 11/10/2016  . Fibromyalgia   . GERD (gastroesophageal reflux disease)   . Headache    occasionally  . Internal hemorrhoids   . Interstitial cystitis 06-06-12   hx.  . Irritable bowel syndrome   . PONV (postoperative nausea and vomiting)    now uses stomach blockers and no ponv  . 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 Raynauds disease    hx.  . Seronegative arthritis    [name]  . 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 She has never used smokeless tobacco. She reports that she does not drink alcohol and does not use drugs.   Current Outpatient Medications:  .  ALPRAZolam (XANAX) 1 MG  tablet, Take 0.5 mg by mouth at bedtime. , Disp: , Rfl: 0 .  amLODipine (NORVASC) 5 MG tablet, Take 1.5 tablets (7.5 mg total) by mouth daily., Disp: 45 tablet, Rfl: 1 .  amphetamine-dextroamphetamine (ADDERALL XR) 30 MG 24 hr capsule, TAKE 1 CAPSULE BY MOUTH EVERY MORNING, Disp: 90 capsule, Rfl: 0 .  ARIPiprazole (ABILIFY) 5 MG tablet, Take 5 mg by mouth at bedtime. , Disp: , Rfl:  .  calcium-vitamin D (OSCAL WITH D) 500-200 MG-UNIT tablet, Take 1 tablet by mouth 2 (two) times daily., Disp: , Rfl:  .  cyanocobalamin (,VITAMIN B-12,) 1000 MCG/ML injection, INJECT 1 ML IM EVERY 21 DAYS, Disp: 3 mL, Rfl: 3 .  cyclobenzaprine (FLEXERIL) 5 MG tablet, TAKE ONE TABLET BY MOUTH 3 TIMES DAILY AS NEEDED FOR MUSCLE SPASMS, Disp: 30 tablet, Rfl: 1 .  diclofenac sodium (VOLTAREN) 1 % GEL, Apply 2-4 grams to affected joint up to 4 times daily., Disp: 400 g, Rfl: 4 .  Eszopiclone 3 MG TABS, TAKE ONE TABLET AT BEDTIME, Disp: 90 tablet, Rfl: 0 .  ferrous sulfate 325 (65 FE) MG tablet, Take 325 mg by mouth every Monday, Wednesday, and Friday. , Disp: , Rfl:  .  fluticasone (FLONASE) 50 MCG/ACT nasal spray, Place 1 spray into both nostrils daily as needed for allergies., Disp: 16 g, Rfl: 5 .  lamoTRIgine (LAMICTAL) 100 MG tablet, Take 100 mg by mouth daily., Disp: , Rfl:  .  LINZESS 290 MCG CAPS capsule, TAKE 1 CAPSULE BY MOUTH DAILY, Disp: 90 capsule, Rfl: 1 .  morphine (MSIR) 15 MG tablet, TAKE 1 TABLET BY MOUTH THREE TIMES DAILYAS NEEDED FOR MODERATE OR SEVERE PAIN, Disp: 90 tablet, Rfl: 0 .  MOVANTIK 25 MG TABS tablet, TAKE 1 TABLET BY MOUTH DAILY, Disp: 30 tablet, Rfl: 6 .  NONFORMULARY OR COMPOUNDED ITEM, Testosterone propionate 2% in white petrolatum, apply small amount once daily for 5 days per week., Disp: 60 each, Rfl: 0 .  ondansetron (ZOFRAN) 4 MG tablet, TAKE ONE TABLET EVERY EIGHT HOURS AS NEEDED FOR NAUSEA / VOMITING, Disp: 30 tablet, Rfl: 1 .  ondansetron (ZOFRAN-ODT) 4 MG disintegrating tablet, TAKE  1 TABLET BY MOUTH EVERY 8 HOURS AS NEEDED FOR NAUSEA OR VOMITING, Disp: 20 tablet, Rfl: 1 .  pantoprazole (PROTONIX) 40 MG tablet, TAKE 1 TABLET BY MOUTH TWICE DAILY WITH A MEAL, Disp: 180 tablet, Rfl: 1 .  pentosan polysulfate (ELMIRON) 100 MG capsule, Take 200 mg by mouth 2 (two) times daily. , Disp: , Rfl:  .  phenazopyridine (PYRIDIUM) 100 MG tablet, Take by mouth., Disp: , Rfl:  .  polyethylene glycol (MIRALAX) 17 g packet, Take 17 g by mouth 3 (three) times daily. , Disp: , Rfl:  .  Prasterone (INTRAROSA) 6.5 MG INST, Place 1 suppository vaginally at bedtime., Disp: 84 each, Rfl: 3 .  pregabalin (LYRICA) 150 MG capsule, TAKE 1 CAPSULE BY MOUTH TWICE DAILY, Disp: 180 capsule, Rfl: 0 .  PREVIDENT 5000  BOOSTER PLUS 1.1 % PSTE, See admin instructions., Disp: , Rfl:  .  senna (SENOKOT) 8.6 MG tablet, Take 2 tablets by mouth daily as needed. , Disp: , Rfl:  .  SF 5000 PLUS 1.1 % CREA dental cream, Place 1 application onto teeth at bedtime. , Disp: , Rfl:  .  ursodiol (ACTIGALL) 300 MG capsule, Take 1 capsule (300 mg total) by mouth 2 (two) times daily., Disp: 180 capsule, Rfl: 3 .  methocarbamol (ROBAXIN) 500 MG tablet, Take 1 tablet (500 mg total) by mouth every 6 (six) hours as needed for muscle spasms. (Patient not taking: Reported on 04/17/2020), Disp: 40 tablet, Rfl: 1   Observations/Objective: Blood pressure 124/78, pulse 76, temperature (!) 97.2 F (36.2 C), temperature source Oral, weight 124 lb (56.2 kg), last menstrual period 07/25/1998.  Physical Exam  Physical Exam Constitutional:      General: The patient is not in acute distress. Pulmonary:     Effort: Pulmonary effort is normal. No respiratory distress.  Neurological:     Mental Status: The patient is alert and oriented to person, place, and time.  Psychiatric:        Mood and Affect: Mood normal.        Behavior: Behavior normal.  HEENT: red bump on lower lid noted, mild lower lid swelling. EOMI, no redness of  conjunctiva.  Assessment and Plan Hordeolum externum of left lower eyelid  No associated conjunctivitis or involvement in periorbital space.  Treat with  warm compresses  3-4 times daily and topical erythromycin.  Call if not resolving in 2 weeks or if increasing pain or new vision changes.     I discussed the assessment and treatment plan with the patient. The patient was provided an opportunity to ask questions and all were answered. The patient agreed with the plan and demonstrated an understanding of the instructions.   The patient was advised to call back or seek an in-person evaluation if the symptoms worsen or if the condition fails to improve as anticipated.     Eliezer Lofts, MD

## 2020-04-17 NOTE — Assessment & Plan Note (Signed)
No associated conjunctivitis or involvement in periorbital space.  Treat with  warm compresses  3-4 times daily and topical erythromycin.  Call if not resolving in 2 weeks or if increasing pain or new vision changes.

## 2020-04-20 ENCOUNTER — Telehealth: Payer: Self-pay

## 2020-04-20 NOTE — Telephone Encounter (Signed)
Spoke with patient and scheduled her for an appointment on 04/21/2020 at 9:20 am to discuss AV[facility] labs.

## 2020-04-20 NOTE — Progress Notes (Signed)
Office Visit Note  Patient: Rachel Duran             Date of Birth: 09/28/1955           MRN: [id number]             PCP: Ria Bush, MD Referring: Ria Bush, MD Visit Date: 04/21/2020 Occupation: _0 @  Subjective:  Discuss AVISE labs  History of Present Illness: Rachel Duran is a 64 y.o. female with history of [name], DDD, and fibromyalgia.  Patient presents today to discuss recent AVISE lab results from 03/26/20.  She has strong family history of scleroderma (mother) and has been experiencing progressively worsening symptoms of [name] and skin tightness in her hands.  She reports she continues to have pain and stiffness in both hands but denies joint swelling.  Her symptoms of raynaud's have been very active despite increasing the dose of norvasc to 7.5 mg daily.  She states she has been monitoring her blood pressure closely and it has been well controlled.  She states she was evaluated by Dr. Delman Cheadle, her dermatologist, about 1 week after her last appointment with [place] in August 2021.  According to the patient Dr. 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 She reports she was reviewing the symptoms of scleroderma online and states she has several of the symptoms listed supporting the suspected diagnosis. She has chronic constipation and has been experiencing worsening dysphagia over the past 3 years. She is followed yearly by Dr. Fuller Plan (GI).  According to the patient about 3 years ago she underwent an esophageal dilation for the progressive symptoms of dysphagia, but she did not notice any improvement in her symptoms.   She has an upcoming appointment with Dr. Vaughan Browner at [facility] to discuss the new diagnosis.  She denies any new or worsening pulmonary symptoms. She would like to proceed with a HRCT and PFTs if Dr. Vaughan Browner agrees with our recommendations.  She is also open to a referral to cardiology to discuss scheduling an echocardiogram to evaluate for pulmonary hypertension.   She continues to have severe chronic pain in her right knee joint.  She has been following along closely with Dr. [name].  She tried a synviscOne injection on 03/10/20 but did not notice any improvement.  She has an appointment with Dr. [name] this morning for further evaluation and to discuss a referral for a partial knee replacement.     Activities of Daily Living:  Patient reports morning stiffness for 2  hours.   Patient Reports nocturnal pain.  Difficulty dressing/grooming: Denies Difficulty climbing stairs: Reports Difficulty getting out of chair: Reports Difficulty using hands for taps, buttons, cutlery, and/or writing: Reports  Review of Systems  Constitutional: Positive for fatigue.  HENT: Positive for mouth dryness. Negative for mouth sores and nose dryness.   Eyes: Positive for pain and dryness. Negative for itching and visual disturbance.  Respiratory: Negative for cough, hemoptysis, shortness of breath, wheezing and difficulty breathing.   Cardiovascular: Negative for chest pain, palpitations, hypertension and swelling in legs/feet.  Gastrointestinal: Positive for constipation. Negative for blood in stool and diarrhea.  Endocrine: Negative for increased urination.  Genitourinary: Negative for difficulty urinating and painful urination.  Musculoskeletal: Positive for arthralgias, joint pain, myalgias, morning stiffness, muscle tenderness and myalgias. Negative for joint swelling and muscle weakness.  Skin: Negative for color change, pallor, rash, hair loss, nodules/bumps, redness, skin tightness, ulcers and  sensitivity to sunlight.  Allergic/Immunologic: Negative for susceptible to infections.  Neurological: Negative for dizziness, numbness, headaches and memory loss.  Hematological: Positive for bruising/bleeding tendency. Negative for swollen  glands.  Psychiatric/Behavioral: Positive for sleep disturbance. Negative for depressed mood and confusion. The patient is not nervous/anxious.     PMFS History:  Patient Active Problem List   Diagnosis Date Noted   Hordeolum externum of left lower eyelid 04/17/2020   Positive ANA (antinuclear antibody) 06/05/2019   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   Unilateral primary osteoarthritis, left hip 01/28/2019   Pelvic pain 12/12/2018   Common bile duct (CBD) obstruction    Elevated alkaline phosphatase level    Venous insufficiency of left lower extremity 07/04/2018   Dyslipidemia 09/22/2017   DDD (degenerative disc disease), cervical 04/03/2017   DDD (degenerative disc disease), lumbar 04/03/2017   Onychomycosis 03/21/2017   Dysphagia 10/18/2016   Encounter for chronic pain management 10/18/2016   Biliary stasis 09/26/2016   Fever blister 08/18/2016   Urinary urgency 07/05/2016   HNP (herniated nucleus pulposus), lumbar 09/23/2015   Sinus congestion 07/30/2015   Iron deficiency 07/30/2015   Health maintenance examination 06/15/2015   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   Headache 03/09/2015   Clavicle enlargement 12/13/2014   Prediabetes 12/13/2014   Plantar fasciitis, bilateral 09/11/2014   Advanced care planning/counseling discussion 06/10/2014   Medicare annual wellness visit, subsequent 06/10/2014   Abnormal thyroid function test 06/10/2014   Chronic pain syndrome 06/10/2014   CFS (chronic fatigue syndrome) 04/08/2014   Postmenopausal atrophic vaginitis 10/19/2012   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   Cervical disc disorder with radiculopathy of cervical region 05/28/2010   CHEST PAIN UNSPECIFIED 02/28/2008   APHTHOUS ULCERS 01/31/2008   Chronic insomnia 11/09/2007   Drug-induced constipation 10/11/2007   Allergic rhinitis 04/12/2007   MDD (major depressive disorder), recurrent episode, moderate ([place])  03/05/2007   [name] syndrome 03/05/2007   GERD 03/05/2007   ROSACEA 03/05/2007   NEURALGIA 03/05/2007   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 C6- C7 -hx. past fusion(bone graft used)   Cholecystitis    Chronic pain    DDD (degenerative disc disease), lumbar 09/2015   dextroscoliosis with multilevel DDD and facet arthrosis most notable for R foraminal disc protrusion L4/5 producing severe R neural foraminal stenosis abutting R L4 nerve root, moderate spinal canal and mild lat recesss and R neural foraminal stenosis L3/4 (MRI)   Depression    bipolar depression   Disorders of porphyrin metabolism    Felon of finger of left hand 11/10/2016   Fibromyalgia    GERD (gastroesophageal reflux disease)    Headache    occasionally   Internal hemorrhoids    Interstitial cystitis 06-06-12   hx.   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 (Dr Baird Cancer retinologist)    Family History  Problem Relation Age of Onset   CAD Father 26       MI, nonsmoker   Esophageal cancer Father 56   Stomach cancer Father    Scleroderma Mother    Hypertension Mother    Esophageal cancer Paternal Grandfather    Stomach cancer Paternal Grandfather    Diabetes Maternal  Grandmother    Arthritis Brother    Stroke Neg Hx    Colon cancer Neg Hx    Rectal cancer Neg Hx    Past Surgical History:  Procedure Laterality Date   ANTERIOR CERVICAL DECOMP/DISCECTOMY FUSION  2004   C5/6, C6/7   ANTERIOR CERVICAL DECOMP/DISCECTOMY FUSION  02/2016   C3/4, C4/5 with plating [name])   AUGMENTATION MAMMAPLASTY Bilateral 03/25/2010   BREAST ENHANCEMENT SURGERY  2010   BREAST IMPLANT EXCHANGE  10/2014   exchange saline implants, B mastopexy/capsulorraphy ([facility])   BUNIONECTOMY Bilateral yrs ago   [place]   x 1   CHOLECYSTECTOMY  06/11/2012   Procedure: LAPAROSCOPIC CHOLECYSTECTOMY WITH INTRAOPERATIVE CHOLANGIOGRAM;  Surgeon: [name], MD,FACS;  Location: [facility];  Service: General;  Laterality: N/A;   COLONOSCOPY  02/2018   done for positive cologuard - WNL, rpt 10 yrs Fuller Plan)   CYSTOSCOPY     ERCP  05/22/2012   Procedure: ENDOSCOPIC RETROGRADE CHOLANGIOPANCREATOGRAPHY (ERCP);  Surgeon: [name], MD,FACG;  Location: [facility];  Service: Endoscopy;  Laterality: N/A;   ERCP N/A 09/17/2013   Procedure: ENDOSCOPIC RETROGRADE CHOLANGIOPANCREATOGRAPHY (ERCP);  Surgeon: [name], MD;  Location: [facility];  Service: Endoscopy;  Laterality: N/A;   ERCP N/A 09/27/2018   Procedure: ENDOSCOPIC RETROGRADE CHOLANGIOPANCREATOGRAPHY (ERCP);  Surgeon: [name], MD;  Location: [facility];  Service: Endoscopy;  Laterality: N/A;   ESOPHAGOGASTRODUODENOSCOPY  09/2016   WNL. esophagus dilated Fuller Plan)   HEMORRHOID BANDING  09-23-13   --Dr. Greer Pickerel   HERNIA REPAIR     inguinal   HYSTEROSCOPY W/ ENDOMETRIAL ABLATION     NASAL SINUS SURGERY     x5   REMOVAL OF STONES  09/27/2018   Procedure: REMOVAL OF STONES;  Surgeon: [name], MD;  Location: [facility];  Service: Endoscopy;;   SPHINCTEROTOMY  09/27/2018   Procedure: SPHINCTEROTOMY;  Surgeon: [name], MD;  Location: [facility];   Service: Endoscopy;;   TOTAL HIP ARTHROPLASTY Left 03/12/2019   Procedure: LEFT TOTAL HIP ARTHROPLASTY ANTERIOR APPROACH;  [name], [name], MD)   UPPER GASTROINTESTINAL ENDOSCOPY     Social History   Social History Narrative   Lives with husband and dog   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   Activity: tries to walk dog (45 min 3x/wk), yoga   Diet: good water, fruits/vegetables daily      Rheum: Public librarian   Psychiatrist: [employer]   Surgery: Redmond Pulling   GIFuller Plan   Urology: Amalia Hailey   Immunization History  Administered Date(s) Administered   Influenza Split 05/04/2011, 04/04/2012   Influenza Whole 05/25/2007, 04/28/2008, 04/30/2009, 03/26/2010   Influenza,inj,Quad PF,6+ Mos 04/16/2015, 04/15/2016, 05/16/2017, 04/10/2018, 04/19/2019   Influenza-Unspecified 03/25/2014   MMR 11/28/2017   Moderna SARS-COVID-2 Vaccination 09/02/2019, 09/30/2019   Pneumococcal Conjugate-13 08/02/2013   Pneumococcal Polysaccharide-23 07/30/2009   Td 11/01/2005, 11/10/2016   Zoster Recombinat (Shingrix) 07/21/2017, 11/03/2017  Objective: Vital Signs: BP 137/88 (BP Location: Left Arm, Patient Position: Sitting, Cuff Size: Normal)    Pulse 90    Resp 14    Ht _0  (1.626 m)    Wt 128 lb 12.8 oz (58.4 kg)    LMP 07/25/1998    BMI 22.11 kg/m    Physical Exam Vitals and nursing note reviewed.  Constitutional:      Appearance: She is well-developed.  HENT:     Head: Normocephalic and atraumatic.  Eyes:     Conjunctiva/sclera: Conjunctivae normal.  Pulmonary:     Effort: Pulmonary effort is normal.  Abdominal:     Palpations: Abdomen is soft.  Musculoskeletal:     Cervical back: Normal range of motion.  Skin:    General: Skin is warm and dry.     Capillary Refill: Capillary refill takes 2 to 3 seconds.     Comments: Nailbed capillary dilation noted Skin thickening and tightness noted distal to MCPs, worse distal to PIP joints-both hands and  both feet.  No digital ulcerations or signs of gangrene noted.     Neurological:     Mental Status: She is alert and oriented to person, place, and time.  Psychiatric:        Behavior: Behavior normal.      Musculoskeletal Exam: C-spine limited and painful ROM.  Thoracic and lumbar spine good ROM.  Shoulder joints, elbow joints, wrist joints, MCPs, PIPs, and DIPs good ROM with no synovitis.  PIP and DIP thickening consistent with osteoarthritis of both hands noted. Right knee has painful and limited extension.  Left knee joint has good ROM with no discomfort. Bilateral knee crepitus noted.  Ankle joints good ROM with no tenderness or inflammation. Mild thickening of right 1st MTP joint.  No tenderness or inflammation of MTP joints noted.    CDAI Exam: CDAI Score: -- Patient Global: --; Provider Global: -- Swollen: --; Tender: -- Joint Exam 04/21/2020   No joint exam has been documented for this visit   There is currently no information documented on the homunculus. Go to the Rheumatology activity and complete the homunculus joint exam.  Investigation: No additional findings.  Imaging: No results found.  Recent Labs: Lab Results  Component Value Date   WBC 7.8 02/03/2020   HGB 12.4 02/03/2020   PLT 307.0 02/03/2020   NA 133 (L) 02/03/2020   K 4.8 02/03/2020   CL 97 02/03/2020   CO2 30 02/03/2020   GLUCOSE 103 (H) 02/03/2020   BUN 6 02/03/2020   CREATININE 0.60 02/03/2020   BILITOT 0.4 02/03/2020   ALKPHOS 79 02/03/2020   AST 20 02/03/2020   ALT 14 02/03/2020   PROT 6.6 02/03/2020   ALBUMIN 4.4 02/03/2020   CALCIUM 9.4 02/03/2020   GFRAA >60 03/13/2019    Speciality Comments: No specialty comments available.  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  Scl-70, anti-RNA polymerase III, and anti-centromere protein B were all negative but her ANA was positive (no titer).  The clinical features she is exhibiting as well as her known family history of scleroderma (  mother) raises the suspicion for limited systemic sclerosis.  She has evidence of capillary dilation and periungual erythema on exam today.  Her symptoms of [name] have progressively become more active even during the summer months.  She has not noticed any decrease in frequency or severity of these episodes since increasing the dose of Norvasc to 7.5 mg daily.  No digital ulcerations or signs or gangrene were noted on exam.  Skin tightness noted distal to MCP joints, worse distal to PIP joints.  She has PIP and DIP thickening consistent with osteoarthritis of both hands but no synovitis was noted.   We discussed additional possible signs, symptoms, and complications of systemic sclerosis.   We discussed the importance of monitoring her blood pressure closely and avoiding systemic steroid use.  According to the patient she has been experiencing progressively worsening symptoms of dysphagia for the past 3 years.  She follows up yearly with Dr. Fuller Plan (GI), and according to the patient she has had refractory symptoms despite having an esophageal dilation about 3 years ago.  She also has ongoing constipation.  She was advised to follow up with Dr. Fuller Plan to discuss our suspicion of limited systemic sclerosis.  We will forward our office visit note to him as well.  She has an upcoming appointment with Dr. Vaughan Browner at [facility] to discuss scheduling a HRCT and PFTs to monitor for findings of ILD.  She will also be referred  to Dr. Haroldine Laws to rule out pulmonary hypertension.  She will continue to follow up with her dermatologist Dr. Delman Cheadle as recommended.   She was advised to notify us if she develops any new or worsening symptoms.   [name] disease without gangrene: Active.  She has delayed capillary refill and capillary dilation on exam today.  No digital ulcerations or signs of gangrene noted. Skin tightness distal to MCPs noted.  She is currently taking norvasc 7.5 mg daily which she will continue taking as prescribed. She was advised to monitor her blood pressure closely. We also discussed the importance of avoiding triggers especially with cooler temperatures starting.   Primary osteoarthritis of both knees - X-rays on 09/05/19 show mild to moderate osteoarthritis and severe chondromalacia patella in both knee joints. She continues to have chronic pain in the right knee joint. She has been having difficulty walking prolonged distances due to the discomfort.  She has not been able to be as active as she would like to.  She has painful limited extension of the right knee joint on exam.  She has bilateral knee crepitus.  She had a SynviscOne injection in the right knee joint performed on 03/10/20 by Dr. [name], but she did not notice any improvement in her discomfort.  She has an appointment with Dr. [name] today for further evaluation and to discuss a referral for a partial knee replacement in the future.   Primary osteoarthritis of both hands: She has PIP and DIP thickening consistent with osteoarthritis of both hands.  No tenderness or inflammation at this time. Joint protection and muscle strengthening were discussed.   S/P hip replacement, left - Performed by Dr. [name]. Doing well.  She has no discomfort at this time.   Trochanteric bursitis of both hips: She experiences intermittent discomfort and performs stretching exercises on a regular basis.   DDD (degenerative disc disease), cervical - s/p fusion x2:  She has chronic pain and stiffness in her C-spine.  She is not experiencing any symptoms of radiculopathy at this time.  DDD (degenerative disc disease), lumbar: She experiences intermittent discomfort in her lower back.  No symptoms of radiculopathy.   Fibromyalgia -She experiences intermittent myalgias and muscle tenderness due to underlying fibromyalgia. She has ongoing trapezius muscle tension and tenderness bilaterally.  She will continue taking lyrica 150 mg twice daily and flexeril 5 mg TID prn for muscle spasms. Discussed the importance of regular exercise and good sleep hygiene.    Chronic pain syndrome: She takes lyrica 150 mg 1 capsule by mouth twice daily.   CFS (chronic fatigue syndrome): Stable.  She exercises on a regular basis.   Other medical conditions are listed as follows:   Hx of porphyria  History of bipolar disorder  Prediabetes  History of gastroesophageal reflux (GERD)  Orders: No orders of the defined types were placed in this encounter.  No orders of the defined types were placed in this encounter.     Follow-Up Instructions: Return for [name], Osteoarthritis, DDD.   Ofilia Neas, PA-C  Note - This record has been created using Dragon software.  Chart creation errors have been sought, but may not always  have been located. Such creation errors do not reflect on  the standard of medical care.

## 2020-04-20 NOTE — Telephone Encounter (Signed)
Patient left a voicemail checking to see if the results from her [place] that she had on 03/26/20 has been received.  Patient states she was told someone from the office would call her to set up a consultation to go over the results.  Please advise.

## 2020-04-21 ENCOUNTER — Encounter: Payer: Self-pay | Admitting: Physician Assistant

## 2020-04-21 ENCOUNTER — Encounter: Payer: Self-pay | Admitting: Orthopaedic Surgery

## 2020-04-21 ENCOUNTER — Ambulatory Visit (INDEPENDENT_AMBULATORY_CARE_PROVIDER_SITE_OTHER): Payer: Medicare Other | Admitting: Physician Assistant

## 2020-04-21 ENCOUNTER — Other Ambulatory Visit: Payer: Self-pay

## 2020-04-21 ENCOUNTER — Ambulatory Visit (INDEPENDENT_AMBULATORY_CARE_PROVIDER_SITE_OTHER): Payer: Medicare Other | Admitting: Orthopaedic Surgery

## 2020-04-21 VITALS — BP 137/88 | HR 90 | Resp 14 | Ht 64.0 in | Wt 128.8 lb

## 2020-04-21 DIAGNOSIS — M7061 Trochanteric bursitis, right hip: Secondary | ICD-10-CM

## 2020-04-21 DIAGNOSIS — M25561 Pain in right knee: Secondary | ICD-10-CM

## 2020-04-21 DIAGNOSIS — G9332 Myalgic encephalomyelitis/chronic fatigue syndrome: Secondary | ICD-10-CM

## 2020-04-21 DIAGNOSIS — M170 Bilateral primary osteoarthritis of knee: Secondary | ICD-10-CM | POA: Diagnosis not present

## 2020-04-21 DIAGNOSIS — M797 Fibromyalgia: Secondary | ICD-10-CM

## 2020-04-21 DIAGNOSIS — G8929 Other chronic pain: Secondary | ICD-10-CM

## 2020-04-21 DIAGNOSIS — R7303 Prediabetes: Secondary | ICD-10-CM

## 2020-04-21 DIAGNOSIS — M1711 Unilateral primary osteoarthritis, right knee: Secondary | ICD-10-CM | POA: Diagnosis not present

## 2020-04-21 DIAGNOSIS — M5136 Other intervertebral disc degeneration, lumbar region: Secondary | ICD-10-CM

## 2020-04-21 DIAGNOSIS — M349 Systemic sclerosis, unspecified: Secondary | ICD-10-CM

## 2020-04-21 DIAGNOSIS — M2241 Chondromalacia patellae, right knee: Secondary | ICD-10-CM | POA: Diagnosis not present

## 2020-04-21 DIAGNOSIS — Z96642 Presence of left artificial hip joint: Secondary | ICD-10-CM

## 2020-04-21 DIAGNOSIS — G894 Chronic pain syndrome: Secondary | ICD-10-CM

## 2020-04-21 DIAGNOSIS — M7062 Trochanteric bursitis, left hip: Secondary | ICD-10-CM

## 2020-04-21 DIAGNOSIS — Z8639 Personal history of other endocrine, nutritional and metabolic disease: Secondary | ICD-10-CM

## 2020-04-21 DIAGNOSIS — Z8659 Personal history of other mental and behavioral disorders: Secondary | ICD-10-CM

## 2020-04-21 DIAGNOSIS — M19041 Primary osteoarthritis, right hand: Secondary | ICD-10-CM

## 2020-04-21 DIAGNOSIS — Z8719 Personal history of other diseases of the digestive system: Secondary | ICD-10-CM

## 2020-04-21 DIAGNOSIS — M5030 Other cervical disc degeneration, unspecified cervical region: Secondary | ICD-10-CM

## 2020-04-21 DIAGNOSIS — I7300 Raynaud's syndrome without gangrene: Secondary | ICD-10-CM

## 2020-04-21 DIAGNOSIS — M19042 Primary osteoarthritis, left hand: Secondary | ICD-10-CM

## 2020-04-21 DIAGNOSIS — R5382 Chronic fatigue, unspecified: Secondary | ICD-10-CM

## 2020-04-21 NOTE — Addendum Note (Signed)
Addended by: Rene Kocher on: 04/21/2020 02:11 PM   Modules accepted: Orders

## 2020-04-21 NOTE — Progress Notes (Signed)
The patient comes in today for continued follow-up with severe knee pain on the right knee.  She has known moderate patellofemoral arthritis.  The medial lateral compartments of the right knee are very well-maintained.  She is a thin individual.  She sees a rheumatologist but that is for not rheumatoid disease but with a diagnosis of limited systemic sclerosis.  She is in chronic pain management as well.  She is very active.  Examination of her right knee shows only pain at the patellofemoral joint with compressing the patella against the trochlear groove.  There is no instability of the knee itself or the patella.  At this point I would like to send her to my partner Dr. Alphonzo Severance to consider a patellofemoral replacement for the right knee if she is a candidate for this type of surgery which I think she may be based on my clinical exam combined with her plain film and MRI findings.  She agrees with this consultation with my partner.  We will work on getting that set up.  All question concerns were answered and addressed.

## 2020-04-24 DIAGNOSIS — M349 Systemic sclerosis, unspecified: Secondary | ICD-10-CM

## 2020-04-24 HISTORY — DX: Systemic sclerosis, unspecified: M34.9

## 2020-04-27 ENCOUNTER — Ambulatory Visit (INDEPENDENT_AMBULATORY_CARE_PROVIDER_SITE_OTHER): Payer: Medicare Other | Admitting: Pulmonary Disease

## 2020-04-27 ENCOUNTER — Other Ambulatory Visit: Payer: Self-pay

## 2020-04-27 ENCOUNTER — Encounter: Payer: Self-pay | Admitting: Pulmonary Disease

## 2020-04-27 VITALS — BP 126/76 | HR 100 | Temp 97.2°F | Ht 64.0 in | Wt 130.2 lb

## 2020-04-27 DIAGNOSIS — M349 Systemic sclerosis, unspecified: Secondary | ICD-10-CM

## 2020-04-27 NOTE — Addendum Note (Signed)
Addended by: Vanessa Barbara on: 04/27/2020 11:30 AM   Modules accepted: Orders

## 2020-04-27 NOTE — Progress Notes (Signed)
Rachel Duran    [id number]    Aug 17, 1955  Primary Care Physician:Gutierrez, Garlon Hatchet, MD  Referring Physician: Ofilia Neas, PA-C [address] [place],  [place] [address]  Chief complaint: Consult for ILD evaluation  HPI: 64 year old with history of [name], fibromyalgia with recent diagnosis of scleroderma Referred for evaluation of interstitial lung disease.  She has a strong family history of scleroderma and longstanding [name] phenomena.  More recently she has developed skin thickening over the digits and findings of  capillary dilation and dropout on dermatology evaluation supporting a diagnosis of systemic sclerosis.  She has history of dysphagia and follows with Dr. Fuller Plan, gastroenterology.  Underwent esophageal dilatation around 2018.  Currently she denies any dyspnea, leg swelling.  She has also been referred to Dr. 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  No mold, hot tub, Jacuzzi Smoking history: Never smoker Travel history: Originally from [place].  No significant recent travel Relevant family history: Mother had scleroderma with interstitial lung disease and pulmonary hypertension.  Outpatient Encounter Medications as of 04/27/2020  Medication Sig  . ALPRAZolam (XANAX) 1 MG tablet Take 0.5 mg by mouth at bedtime.   Marland Kitchen amLODipine (NORVASC) 5 MG tablet Take 1.5 tablets (7.5 mg total) by mouth daily.  Marland Kitchen amphetamine-dextroamphetamine (ADDERALL XR) 30 MG 24 hr capsule TAKE 1 CAPSULE BY MOUTH EVERY MORNING  . ARIPiprazole (ABILIFY) 5 MG tablet Take 5 mg by mouth at bedtime.   . calcium-vitamin D (OSCAL WITH D) 500-200 MG-UNIT tablet Take 1 tablet by mouth 2 (two) times daily.  . cyanocobalamin (,VITAMIN B-12,) 1000 MCG/ML injection INJECT 1 ML IM EVERY 21 DAYS  . cyclobenzaprine (FLEXERIL) 5 MG tablet TAKE ONE  TABLET BY MOUTH 3 TIMES DAILY AS NEEDED FOR MUSCLE SPASMS  . diclofenac sodium (VOLTAREN) 1 % GEL Apply 2-4 grams to affected joint up to 4 times daily.  Marland Kitchen erythromycin ophthalmic ointment Place 1 application into the left eye at bedtime.  . Eszopiclone 3 MG TABS TAKE ONE TABLET AT BEDTIME  . ferrous sulfate 325 (65 FE) MG tablet Take 325 mg by mouth every Monday, Wednesday, and Friday.   . fluticasone (FLONASE) 50 MCG/ACT nasal spray Place 1 spray into both nostrils daily as needed for allergies.  Marland Kitchen lamoTRIgine (LAMICTAL) 100 MG tablet Take 100 mg by mouth daily.  Marland Kitchen LINZESS 290 MCG CAPS capsule TAKE 1 CAPSULE BY MOUTH DAILY  . morphine (MSIR) 15 MG tablet TAKE 1 TABLET BY MOUTH THREE TIMES DAILYAS NEEDED FOR MODERATE OR SEVERE PAIN  . MOVANTIK 25 MG TABS tablet TAKE 1 TABLET BY MOUTH DAILY  . NONFORMULARY OR COMPOUNDED ITEM Testosterone propionate 2% in white petrolatum, apply small amount once daily for 5 days per week.  . ondansetron (ZOFRAN) 4 MG tablet TAKE ONE TABLET EVERY EIGHT HOURS AS NEEDED FOR NAUSEA / VOMITING  . ondansetron (ZOFRAN-ODT) 4 MG disintegrating tablet TAKE 1 TABLET BY MOUTH EVERY 8 HOURS AS NEEDED FOR NAUSEA OR VOMITING  . pantoprazole (PROTONIX) 40 MG tablet TAKE 1 TABLET BY MOUTH TWICE DAILY WITH A MEAL  . pentosan polysulfate (ELMIRON) 100 MG capsule Take 200 mg by mouth 2 (two) times daily.   . phenazopyridine (PYRIDIUM) 100 MG tablet Take by mouth as needed.   . polyethylene glycol (MIRALAX) 17 g packet Take 17 g by mouth 3 (three) times  daily.   . Prasterone (INTRAROSA) 6.5 MG INST Place 1 suppository vaginally at bedtime.  . pregabalin (LYRICA) 150 MG capsule TAKE 1 CAPSULE BY MOUTH TWICE DAILY  . PREVIDENT 5000 BOOSTER PLUS 1.1 % PSTE See admin instructions.  . senna (SENOKOT) 8.6 MG tablet Take 2 tablets by mouth daily as needed.   . SF 5000 PLUS 1.1 % CREA dental cream Place 1 application onto teeth at bedtime.   . ursodiol (ACTIGALL) 300 MG capsule Take 1  capsule (300 mg total) by mouth 2 (two) times daily.   No facility-administered encounter medications on file as of 04/27/2020.    Allergies as of 04/27/2020 - Review Complete 04/27/2020  Allergen Reaction Noted  . Inh [isoniazid] Other (See Comments) 05/11/2012  . Cymbalta [duloxetine hcl] Other (See Comments) 11/15/2011  . Nucynta [tapentadol] Other (See Comments) 06/03/2015  . Silenor [doxepin hcl] Other (See Comments) 11/03/2014  . Benzoin Dermatitis 03/12/2019  . Fentanyl Other (See Comments) 10/02/2019  . Celebrex [celecoxib] Other (See Comments) 03/09/2015  . Codeine phosphate Nausea And Vomiting and Other (See Comments) 12/06/2006  . Lithium Other (See Comments) 10/07/2016  . Meperidine hcl Rash and Other (See Comments) 11/09/2007  . Sulfamethoxazole Rash 12/06/2006    Past Medical History:  Diagnosis Date  . Abdominal pain last 4 months   and nausea also  . Allergy   . Anemia    history of  . Anxiety   . Bipolar disorder ([place])    atpical bipolar disorder  . 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 C6- C7 -hx. past fusion(bone graft used)  . Cholecystitis   . Chronic pain   . DDD (degenerative disc disease), lumbar 09/2015   dextroscoliosis with multilevel DDD and facet arthrosis most notable for R foraminal disc protrusion L4/5 producing severe R neural foraminal stenosis abutting R L4 nerve root, moderate spinal canal and mild lat recesss and R neural foraminal stenosis L3/4 (MRI)  . Depression    bipolar depression  . Disorders of porphyrin metabolism   . Felon of finger of left hand 11/10/2016  . Fibromyalgia   . GERD (gastroesophageal reflux disease)   . Headache    occasionally  . Internal hemorrhoids   . Interstitial cystitis 06-06-12   hx.  . Irritable bowel syndrome   . PONV (postoperative nausea and vomiting)    now uses stomach blockers and no ponv  . 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 Raynauds disease    hx.  . Seronegative arthritis    [name]  . 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 ANTERIOR CERVICAL DECOMP/DISCECTOMY FUSION  2004   C5/6, C6/7  . ANTERIOR CERVICAL DECOMP/DISCECTOMY FUSION  02/2016   C3/4, C4/5 with plating [name])  . AUGMENTATION MAMMAPLASTY Bilateral 03/25/2010  . BREAST ENHANCEMENT SURGERY  2010  . BREAST IMPLANT EXCHANGE  10/2014   exchange saline implants, B mastopexy/capsulorraphy ([facility])  . BUNIONECTOMY Bilateral yrs ago  . [place]   x 1  . CHOLECYSTECTOMY  06/11/2012   Procedure: LAPAROSCOPIC CHOLECYSTECTOMY WITH INTRAOPERATIVE CHOLANGIOGRAM;  Surgeon: [name], MD,FACS;  Location: [facility];  Service: General;  Laterality: N/A;  . COLONOSCOPY  02/2018  done for positive cologuard - WNL, rpt 10 yrs Fuller Plan)  . CYSTOSCOPY    . ERCP  05/22/2012   Procedure: ENDOSCOPIC RETROGRADE CHOLANGIOPANCREATOGRAPHY (ERCP);  Surgeon: [name], MD,FACG;  Location: [facility];  Service: Endoscopy;  Laterality: N/A;  . ERCP N/A 09/17/2013   Procedure: ENDOSCOPIC RETROGRADE CHOLANGIOPANCREATOGRAPHY (ERCP);  Surgeon: [name], MD;  Location: [facility];  Service: Endoscopy;  Laterality: N/A;  . ERCP N/A 09/27/2018   Procedure: ENDOSCOPIC RETROGRADE CHOLANGIOPANCREATOGRAPHY (ERCP);  Surgeon: [name], MD;  Location: [facility];  Service: Endoscopy;  Laterality: N/A;  . ESOPHAGOGASTRODUODENOSCOPY  09/2016   WNL. esophagus dilated Fuller Plan)  . HEMORRHOID BANDING  09-23-13   --Dr. Greer Pickerel  . HERNIA REPAIR     inguinal  . HYSTEROSCOPY W/ ENDOMETRIAL ABLATION    . NASAL SINUS SURGERY     x5  . REMOVAL OF STONES  09/27/2018   Procedure: REMOVAL OF STONES;  Surgeon: [name], MD;  [facility];  Service: Endoscopy;;  . [name]  09/27/2018   Procedure: SPHINCTEROTOMY;  Surgeon: [name], MD;  [facility];  Service: Endoscopy;;  . TOTAL HIP ARTHROPLASTY Left 03/12/2019   Procedure: LEFT TOTAL HIP ARTHROPLASTY ANTERIOR APPROACH;  [name], [name], MD)  . UPPER GASTROINTESTINAL ENDOSCOPY      Family History  Problem Relation Age of Onset  . CAD Father 89       MI, nonsmoker  . Esophageal cancer Father 37  . Stomach cancer Father   . Scleroderma Mother   . Hypertension Mother   . Esophageal cancer Paternal Grandfather   . Stomach cancer Paternal Grandfather   . Diabetes Maternal Grandmother   . Arthritis Brother   . Stroke Neg Hx   . Colon cancer Neg Hx   . Rectal cancer Neg Hx     Social History   Socioeconomic History  . Marital status: Married    Spouse name: Not on file  . Number of children: Not on file  . Years of education: Not on file  . Highest education level: Not on file  Occupational History  . Occupation: retired    Fish farm manager: UNEMPLOYED  Tobacco Use  . Smoking status: Never Smoker  . Smokeless tobacco: Never Used  Vaping Use  . Vaping Use: Never used  Substance and Sexual Activity  . Alcohol use: No    Alcohol/week: 0.0 standard drinks  . Drug use: No  . Sexual activity: Yes    Partners: Male    Birth control/protection: Post-menopausal    Comment: vasectomy  Other Topics Concern  . 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 Difficulty of Paying Living Expenses: Not hard at all  Food Insecurity: No Food Insecurity  . Worried About Paediatric nurse in the Last Year: Never true  . Ran Out of Food in the Last Year: Never true  Transportation Needs: No Transportation Needs  . Lack of Transportation (Medical): No  . Lack of Transportation (Non-Medical): No  Physical Activity: Inactive  . Days of  Exercise per Week: 0 days  . Minutes of Exercise per Session: 0 min  Stress: Stress Concern Present  . Feeling of Stress : Rather much  Social Connections:   . Frequency of Communication with Friends and Family: Not on file  . Frequency of Social Gatherings with Friends and Family: Not on file  . Attends Religious Services: Not on file  . Active Member of Clubs or Organizations: Not on file  . Attends Archivist Meetings: Not on file  . Marital Status: Not on file  Intimate Partner Violence: Not At Risk  . Fear of Current or Ex-Partner: No  . Emotionally Abused: No  . Physically Abused: No  . Sexually Abused: No    Review of systems: Review of Systems  Constitutional: Negative for fever and chills.  HENT: Negative.   Eyes: Negative for blurred vision.  Respiratory: as per HPI  Cardiovascular: Negative for chest pain and palpitations.  Gastrointestinal: Negative for vomiting, diarrhea, blood per rectum. Genitourinary: Negative for dysuria, urgency, frequency and hematuria.  Musculoskeletal: Negative for myalgias, back pain and joint pain.  Skin: Negative for itching and rash.  Neurological: Negative for dizziness, tremors, focal weakness, seizures and loss of consciousness.  Endo/Heme/Allergies: Negative for environmental allergies.  Psychiatric/Behavioral: Negative for depression, suicidal ideas and hallucinations.  All other systems reviewed and are negative.  Physical Exam: Blood pressure 126/76, pulse 100, temperature (!) 97.2 F (36.2 C), temperature source Temporal, height 5\' 4"  (1.626 m), weight 130 lb 3.2 oz (59.1 kg), last menstrual period 07/25/1998, SpO2 98 %. Gen:      No acute distress HEENT:   EOMI, sclera anicteric Neck:     No masses; no thyromegaly Lungs:    Clear to auscultation bilaterally; normal respiratory effort CV:         Regular rate and rhythm; no murmurs Abd:      + bowel sounds; soft, non-tender; no palpable masses, no distension Ext:    No edema; adequate peripheral perfusion Skin:      Warm and dry; no rash Neuro: alert and oriented x 3 Psych: normal mood and affect  Data Reviewed: Imaging: Chest x-ray 10/18/2016-no acute cardiopulmonary abnormality.  I have reviewed the images personally.  PFTs:  Labs:  Assessment:  Consult for interstitial lung disease Limited systemic sclerosis Schedule high-res CT and pulmonary function test She has already been referred to Dr. Haroldine Laws for pulmonary hypertension evaluation  Regroup again after these tests to evaluate and plan for next steps  Plan/Recommendations: High-res CT, PFTs  Marshell Garfinkel MD [facility] 04/27/2020, 10:52 AM  CC: Ofilia Neas, PA-C

## 2020-04-27 NOTE — Patient Instructions (Signed)
We will schedule you for high-resolution CT and pulmonary function test for evaluation of any lung involvement from scleroderma  Follow-up in 3 months.

## 2020-04-28 ENCOUNTER — Other Ambulatory Visit: Payer: Self-pay | Admitting: Family Medicine

## 2020-04-28 NOTE — Telephone Encounter (Signed)
Refill request Eszopidone Last refill 01/30/20 #90 Last office visit 04/17/20 acute

## 2020-04-30 NOTE — Telephone Encounter (Signed)
ERx 

## 2020-05-01 ENCOUNTER — Encounter: Payer: Self-pay | Admitting: Orthopedic Surgery

## 2020-05-01 ENCOUNTER — Other Ambulatory Visit: Payer: Self-pay

## 2020-05-01 ENCOUNTER — Ambulatory Visit (INDEPENDENT_AMBULATORY_CARE_PROVIDER_SITE_OTHER): Payer: Medicare Other | Admitting: Orthopedic Surgery

## 2020-05-01 DIAGNOSIS — M1711 Unilateral primary osteoarthritis, right knee: Secondary | ICD-10-CM | POA: Diagnosis not present

## 2020-05-01 NOTE — Progress Notes (Signed)
Office Visit Note   Patient: Rachel Duran           Date of Birth: 03-Aug-1955           MRN: [id number] Visit Date: 05/01/2020 Requested by: Ria Bush, MD [place],  [place] [address] PCP: Ria Bush, MD  Subjective: Chief Complaint  Patient presents with  . Knee Pain    HPI: Rachel Duran is a 64 year old female with right knee pain.  Had relatively abrupt onset of pain in the right knee beginning in January.  She has failed conservative measures of treatment for this pain including physical therapy and injections.  Does report pain with standing as well as going up and down stairs.  Cannot really walk the dog like she used to be able to.  Hard for her to exercise.  She does have a known diagnosis of osteopenia.  Her current knee pain is limiting her physical activity.  She also has been diagnosed with scleroderma and has pulmonary and cardiology appointments pending.  She also has [name] disease.  Takes Norvasc for that.  She currently is not on any immunosuppressants.              ROS: All systems reviewed are negative as they relate to the chief complaint within the history of present illness.  Patient denies  fevers or chills.   Assessment & Plan: Visit Diagnoses:  1. Unilateral primary osteoarthritis, right knee     Plan: Impression is right knee patellofemoral arthritis.  Pretty debilitating for the patient.  We discussed patellofemoral arthroplasty and I showed her radiographs of patients that I than that before in the past.  I think in general she is a good candidate because she has isolated patellofemoral arthritis and no malalignment.  Risk and benefits of the procedure discussed.  She is going to consider her options and I did give her Debbie's card so she could schedule if she wants to.  Follow-Up Instructions: Return if symptoms worsen or fail to improve.   Orders:  No orders of the defined types were placed in this  encounter.  No orders of the defined types were placed in this encounter.     Procedures: No procedures performed   Clinical Data: No additional findings.  Objective: Vital Signs: LMP 07/25/1998   Physical Exam:   Constitutional: Patient appears well-developed HEENT:  Head: Normocephalic Eyes:EOM are normal Neck: Normal range of motion Cardiovascular: Normal rate Pulmonary/chest: Effort normal Neurologic: Patient is alert Skin: Skin is warm Psychiatric: Patient has normal mood and affect    Ortho Exam: Ortho exam demonstrates normal gait alignment.  Excellent muscle tone in bilateral lower extremities.  Pedal pulses intact.  Patellofemoral crepitus is present bilaterally but no effusion is noted.  Negative apprehension.  No increased Q angle.  No J sign.  Does have periretinacular tenderness on the right.  No joint line tenderness is present.  Cruciate ligaments are stable.  Specialty Comments:  No specialty comments available.  Imaging: No results found.   PMFS History: Patient Active Problem List   Diagnosis Date Noted  . Hordeolum externum of left lower eyelid 04/17/2020  . Positive ANA (antinuclear antibody) 06/05/2019  . 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  . Unilateral primary osteoarthritis, left hip 01/28/2019  . Pelvic pain 12/12/2018  . Common bile duct (CBD) obstruction   . Elevated alkaline phosphatase level   . Venous insufficiency of left lower extremity 07/04/2018  . Dyslipidemia 09/22/2017  .  DDD (degenerative disc disease), cervical 04/03/2017  . DDD (degenerative disc disease), lumbar 04/03/2017  . Onychomycosis 03/21/2017  . Dysphagia 10/18/2016  . Encounter for chronic pain management 10/18/2016  . Biliary stasis 09/26/2016  . Fever blister 08/18/2016  . Urinary urgency 07/05/2016  . HNP (herniated nucleus pulposus), lumbar 09/23/2015  . Sinus congestion 07/30/2015  . Iron deficiency 07/30/2015  . Health maintenance  examination 06/15/2015  . 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 Headache 03/09/2015  . Clavicle enlargement 12/13/2014  . Prediabetes 12/13/2014  . Plantar fasciitis, bilateral 09/11/2014  . Advanced care planning/counseling discussion 06/10/2014  . Medicare annual wellness visit, subsequent 06/10/2014  . Abnormal thyroid function test 06/10/2014  . Chronic pain syndrome 06/10/2014  . CFS (chronic fatigue syndrome) 04/08/2014  . Postmenopausal atrophic vaginitis 10/19/2012  . 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 Cervical disc disorder with radiculopathy of cervical region 05/28/2010  . CHEST PAIN UNSPECIFIED 02/28/2008  . APHTHOUS ULCERS 01/31/2008  . Chronic insomnia 11/09/2007  . Drug-induced constipation 10/11/2007  . Allergic rhinitis 04/12/2007  . MDD (major depressive disorder), recurrent episode, moderate ([place]) 03/05/2007  . [name] syndrome 03/05/2007  . GERD 03/05/2007  . ROSACEA 03/05/2007  . NEURALGIA 03/05/2007  . 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 Chronic interstitial cystitis 12/06/2006  . Fibromyalgia 12/06/2006   Past Medical History:  Diagnosis Date  . Abdominal pain last 4 months   and nausea also  . Allergy   . Anemia    history of  . Anxiety   . Bipolar disorder ([place])    atpical bipolar disorder  . 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 C6- C7 -hx. past fusion(bone graft used)  . Cholecystitis   . Chronic pain   . DDD (degenerative disc disease), lumbar 09/2015   dextroscoliosis with multilevel DDD and facet arthrosis most notable for R foraminal disc protrusion L4/5 producing severe R neural foraminal stenosis abutting R L4 nerve root, moderate spinal canal and mild lat recesss and R neural foraminal stenosis L3/4 (MRI)  . Depression    bipolar depression  . Disorders of porphyrin metabolism   . Felon of finger of left hand 11/10/2016  . Fibromyalgia   . GERD (gastroesophageal reflux disease)   . Headache     occasionally  . Internal hemorrhoids   . Interstitial cystitis 06-06-12   hx.  . Irritable bowel syndrome   . PONV (postoperative nausea and vomiting)    now uses stomach blockers and no ponv  . 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 Raynauds disease    hx.  . Seronegative arthritis    [name]  . 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 CAD Father 58       MI, nonsmoker  . Esophageal cancer Father 30  . Stomach cancer Father   . Scleroderma Mother   . Hypertension Mother   . Esophageal cancer Paternal Grandfather   . Stomach cancer Paternal Grandfather   . Diabetes Maternal Grandmother   . Arthritis Brother   . Stroke Neg Hx   . Colon cancer Neg Hx   . Rectal cancer Neg Hx     Past Surgical History:  Procedure Laterality Date  .  ANTERIOR CERVICAL DECOMP/DISCECTOMY FUSION  2004   C5/6, C6/7  . ANTERIOR CERVICAL DECOMP/DISCECTOMY FUSION  02/2016   C3/4, C4/5 with plating [name])  . AUGMENTATION MAMMAPLASTY Bilateral 03/25/2010  . BREAST ENHANCEMENT SURGERY  2010  . BREAST IMPLANT EXCHANGE  10/2014   exchange saline implants, B mastopexy/capsulorraphy ([facility])  . BUNIONECTOMY Bilateral yrs ago  . [place]   x 1  . CHOLECYSTECTOMY  06/11/2012   Procedure: LAPAROSCOPIC CHOLECYSTECTOMY WITH INTRAOPERATIVE CHOLANGIOGRAM;  Surgeon: [name], MD,FACS;  [facility];  Service: General;  Laterality: N/A;  . COLONOSCOPY  02/2018   done for positive cologuard - WNL, rpt 10 yrs Fuller Plan)  . CYSTOSCOPY    . ERCP  05/22/2012   Procedure: ENDOSCOPIC RETROGRADE CHOLANGIOPANCREATOGRAPHY (ERCP);  Surgeon: [name], MD,FACG;  Location: [facility];  Service: Endoscopy;  Laterality: N/A;  . ERCP N/A  09/17/2013   Procedure: ENDOSCOPIC RETROGRADE CHOLANGIOPANCREATOGRAPHY (ERCP);  Surgeon: [name], MD;  Location: [facility];  Service: Endoscopy;  Laterality: N/A;  . ERCP N/A 09/27/2018   Procedure: ENDOSCOPIC RETROGRADE CHOLANGIOPANCREATOGRAPHY (ERCP);  Surgeon: [name], MD;  Location: [facility];  Service: Endoscopy;  Laterality: N/A;  . ESOPHAGOGASTRODUODENOSCOPY  09/2016   WNL. esophagus dilated Fuller Plan)  . HEMORRHOID BANDING  09-23-13   --Dr. Greer Pickerel  . HERNIA REPAIR     inguinal  . HYSTEROSCOPY W/ ENDOMETRIAL ABLATION    . NASAL SINUS SURGERY     x5  . REMOVAL OF STONES  09/27/2018   Procedure: REMOVAL OF STONES;  Surgeon: [name], MD;  [facility];  Service: Endoscopy;;  . [name]  09/27/2018   Procedure: SPHINCTEROTOMY;  Surgeon: [name], MD;  [facility];  Service: Endoscopy;;  . TOTAL HIP ARTHROPLASTY Left 03/12/2019   Procedure: LEFT TOTAL HIP ARTHROPLASTY ANTERIOR APPROACH;  [name], [name], MD)  . UPPER GASTROINTESTINAL ENDOSCOPY     Social History   Occupational History  . Occupation: retired    Fish farm manager: UNEMPLOYED  Tobacco Use  . Smoking status: Never Smoker  . Smokeless tobacco: Never Used  Vaping Use  . Vaping Use: Never used  Substance and Sexual Activity  . Alcohol use: No    Alcohol/week: 0.0 standard drinks  . Drug use: No  . Sexual activity: Yes    Partners: Male    Birth control/protection: Post-menopausal    Comment: vasectomy

## 2020-05-02 ENCOUNTER — Encounter: Payer: Self-pay | Admitting: Orthopedic Surgery

## 2020-05-04 ENCOUNTER — Encounter: Payer: Self-pay | Admitting: Orthopedic Surgery

## 2020-05-04 NOTE — Telephone Encounter (Signed)
Yes to walking- stairs - no lifting restrictions = ok to kneel but not to crawl - post op pain meds we can do but this will be very painful based on pain med usage currently

## 2020-05-04 NOTE — Telephone Encounter (Signed)
See other

## 2020-05-05 ENCOUNTER — Ambulatory Visit (INDEPENDENT_AMBULATORY_CARE_PROVIDER_SITE_OTHER): Payer: Medicare Other | Admitting: Family Medicine

## 2020-05-05 ENCOUNTER — Encounter: Payer: Self-pay | Admitting: Rheumatology

## 2020-05-05 ENCOUNTER — Other Ambulatory Visit: Payer: Self-pay

## 2020-05-05 ENCOUNTER — Encounter: Payer: Self-pay | Admitting: Family Medicine

## 2020-05-05 VITALS — BP 122/74 | HR 103 | Temp 98.0°F | Ht 64.0 in | Wt 129.4 lb

## 2020-05-05 DIAGNOSIS — K5903 Drug induced constipation: Secondary | ICD-10-CM

## 2020-05-05 DIAGNOSIS — G894 Chronic pain syndrome: Secondary | ICD-10-CM | POA: Diagnosis not present

## 2020-05-05 DIAGNOSIS — R1314 Dysphagia, pharyngoesophageal phase: Secondary | ICD-10-CM

## 2020-05-05 DIAGNOSIS — G8929 Other chronic pain: Secondary | ICD-10-CM

## 2020-05-05 DIAGNOSIS — M349 Systemic sclerosis, unspecified: Secondary | ICD-10-CM

## 2020-05-05 DIAGNOSIS — I7300 Raynaud's syndrome without gangrene: Secondary | ICD-10-CM

## 2020-05-05 DIAGNOSIS — M25561 Pain in right knee: Secondary | ICD-10-CM | POA: Insufficient documentation

## 2020-05-05 MED ORDER — NALOXEGOL OXALATE 25 MG PO TABS
25.0000 mg | ORAL_TABLET | Freq: Every day | ORAL | 1 refills | Status: DC
Start: 2020-05-05 — End: 2020-11-23

## 2020-05-05 MED ORDER — NALOXEGOL OXALATE 25 MG PO TABS
25.0000 mg | ORAL_TABLET | Freq: Every day | ORAL | 6 refills | Status: DC
Start: 2020-05-05 — End: 2020-05-05

## 2020-05-05 NOTE — Assessment & Plan Note (Signed)
Recent dx by derm and rheum combined. To undergo pulm and cards workup with PFTs, high res CT lungs and echocardiogram. Appreciate care of all involved.

## 2020-05-05 NOTE — Assessment & Plan Note (Signed)
Ongoing symptomatology, persistent even through summer months despite amlodipine 7.5mg  daily

## 2020-05-05 NOTE — Assessment & Plan Note (Signed)
Longstanding, ongoing. Initially attributed to cervical hardware, now undergoing further rheumatologic workup for limited systemic sclerosis ?related.

## 2020-05-05 NOTE — Assessment & Plan Note (Signed)
Continues miralax, linzess and movantik - ongoing need despite decrease in MMED to 30-45mg  daily - ?constipation related to systemic sclerosis. She may return to GI for further eval

## 2020-05-05 NOTE — Assessment & Plan Note (Signed)
Discussing partial knee replacement with Dr Marlou Sa.

## 2020-05-05 NOTE — Assessment & Plan Note (Signed)
[place] CSRS reviewed Continue MSIR 15mg  2-3 times daily.

## 2020-05-05 NOTE — Progress Notes (Signed)
This visit was conducted in person.  BP 122/74 (BP Location: Left Arm, Patient Position: Sitting, Cuff Size: Normal)   Pulse (!) 103   Temp 98 F (36.7 C) (Temporal)   Ht 5\' 4"  (1.626 m)   Wt 129 lb 7 oz (58.7 kg)   LMP 07/25/1998   SpO2 96%   BMI 22.22 kg/m    CC: 3 mo f/u chronic pain visit  Subjective:    Patient ID: Rachel Duran, female    DOB: Oct 06, 1955, 64 y.o.   MRN: [id number]  HPI: Rachel Duran is a 64 y.o. female presenting on 05/05/2020 for Pain Management (Here for 3 mo f/u.)   Longstanding chronic pain managed with opiate, long acting morphine formulations worked well for her but have slowly been discontinued over the years (3 in the past 5 years - Avinza, Embeda, Kadian). Intolerable side effects to fentanyl and tramadol (recent trials).  Some constipation managed with miralax, linzess daily, movantik daily as well.   Current pain regimen is MSIR 15mg  TID PRN #90/mo. Many days takes just 2 tablets daily. Doesn't need refill yet.   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 To see Bensimhon to eval for pulm HTN. Known fmhx scleroderma (mother).   She has longstanding dysphagia for the past 3 yrs s/p dilation without significant improvement followed by GI [facility]). Also notes ongoing constipation despite lowering opiate dose.   Discussing upcoming R knee surgery (PF arthroplasty) with Dr Marlou Sa ortho for ongoing debilitating kneecap pain.      Relevant past medical, surgical, family and social history reviewed and updated as indicated. Interim medical history since our last visit reviewed. Allergies and medications reviewed and updated. Outpatient Medications Prior to Visit  Medication Sig Dispense Refill  . ALPRAZolam (XANAX) 1 MG tablet Take 0.5 mg by mouth at bedtime.   0  . amLODipine (NORVASC) 5 MG tablet Take 1.5 tablets (7.5 mg total) by mouth daily. 45  tablet 1  . amphetamine-dextroamphetamine (ADDERALL XR) 30 MG 24 hr capsule TAKE 1 CAPSULE BY MOUTH EVERY MORNING 90 capsule 0  . ARIPiprazole (ABILIFY) 5 MG tablet Take 5 mg by mouth at bedtime.     . calcium-vitamin D (OSCAL WITH D) 500-200 MG-UNIT tablet Take 1 tablet by mouth 2 (two) times daily.    . cyanocobalamin (,VITAMIN B-12,) 1000 MCG/ML injection INJECT 1 ML IM EVERY 21 DAYS 3 mL 3  . cyclobenzaprine (FLEXERIL) 5 MG tablet TAKE ONE TABLET BY MOUTH 3 TIMES DAILY AS NEEDED FOR MUSCLE SPASMS 30 tablet 1  . diclofenac sodium (VOLTAREN) 1 % GEL Apply 2-4 grams to affected joint up to 4 times daily. 400 g 4  . erythromycin ophthalmic ointment Place 1 application into the left eye at bedtime. 3.5 g 0  . Eszopiclone 3 MG TABS TAKE ONE TABLET AT BEDTIME 90 tablet 0  . ferrous sulfate 325 (65 FE) MG tablet Take 325 mg by mouth every Monday, Wednesday, and Friday.     . fluticasone (FLONASE) 50 MCG/ACT nasal spray Place 1 spray into both nostrils daily as needed for allergies. 16 g 5  . lamoTRIgine (LAMICTAL) 100 MG tablet Take 100 mg by mouth daily.    Marland Kitchen LINZESS 290 MCG CAPS capsule TAKE 1 CAPSULE BY MOUTH DAILY 90 capsule 1  . morphine (MSIR) 15 MG tablet TAKE 1 TABLET BY MOUTH THREE TIMES DAILYAS NEEDED FOR MODERATE OR SEVERE  PAIN 90 tablet 0  . NONFORMULARY OR COMPOUNDED ITEM Testosterone propionate 2% in white petrolatum, apply small amount once daily for 5 days per week. 60 each 0  . ondansetron (ZOFRAN) 4 MG tablet TAKE ONE TABLET EVERY EIGHT HOURS AS NEEDED FOR NAUSEA / VOMITING 30 tablet 1  . ondansetron (ZOFRAN-ODT) 4 MG disintegrating tablet TAKE 1 TABLET BY MOUTH EVERY 8 HOURS AS NEEDED FOR NAUSEA OR VOMITING 20 tablet 1  . pantoprazole (PROTONIX) 40 MG tablet TAKE 1 TABLET BY MOUTH TWICE DAILY WITH A MEAL 180 tablet 1  . pentosan polysulfate (ELMIRON) 100 MG capsule Take 200 mg by mouth 2 (two) times daily.     . phenazopyridine (PYRIDIUM) 100 MG tablet Take by mouth as needed.       . polyethylene glycol (MIRALAX) 17 g packet Take 17 g by mouth 3 (three) times daily.     . Prasterone (INTRAROSA) 6.5 MG INST Place 1 suppository vaginally at bedtime. 84 each 3  . pregabalin (LYRICA) 150 MG capsule TAKE 1 CAPSULE BY MOUTH TWICE DAILY 180 capsule 0  . PREVIDENT 5000 BOOSTER PLUS 1.1 % PSTE See admin instructions.    . senna (SENOKOT) 8.6 MG tablet Take 2 tablets by mouth daily as needed.     . SF 5000 PLUS 1.1 % CREA dental cream Place 1 application onto teeth at bedtime.     . ursodiol (ACTIGALL) 300 MG capsule Take 1 capsule (300 mg total) by mouth 2 (two) times daily. 180 capsule 3  . MOVANTIK 25 MG TABS tablet TAKE 1 TABLET BY MOUTH DAILY 30 tablet 6   No facility-administered medications prior to visit.     Per HPI unless specifically indicated in ROS section below Review of Systems Objective:  BP 122/74 (BP Location: Left Arm, Patient Position: Sitting, Cuff Size: Normal)   Pulse (!) 103   Temp 98 F (36.7 C) (Temporal)   Ht 5\' 4"  (1.626 m)   Wt 129 lb 7 oz (58.7 kg)   LMP 07/25/1998   SpO2 96%   BMI 22.22 kg/m   Wt Readings from Last 3 Encounters:  05/05/20 129 lb 7 oz (58.7 kg)  04/27/20 130 lb 3.2 oz (59.1 kg)  04/21/20 128 lb 12.8 oz (58.4 kg)      Physical Exam Vitals and nursing note reviewed.  Constitutional:      Appearance: Normal appearance. She is not ill-appearing.  Cardiovascular:     Rate and Rhythm: Normal rate and regular rhythm.     Pulses: Normal pulses.     Heart sounds: Normal heart sounds. No murmur heard.   Pulmonary:     Effort: Pulmonary effort is normal. No respiratory distress.     Breath sounds: Normal breath sounds. No wheezing, rhonchi or rales.  Musculoskeletal:     Right lower leg: No edema.     Left lower leg: No edema.  Neurological:     Mental Status: She is alert.  Psychiatric:        Mood and Affect: Mood normal.        Behavior: Behavior normal.       Assessment & Plan:  This visit occurred during  the SARS-CoV-2 public health emergency.  Safety protocols were in place, including screening questions prior to the visit, additional usage of staff PPE, and extensive cleaning of exam room while observing appropriate contact time as indicated for disinfecting solutions.   Problem List Items Addressed This Visit    [name] syndrome  Ongoing symptomatology, persistent even through summer months despite amlodipine 7.5mg  daily      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[place])    Recent dx by derm and rheum combined. To undergo pulm and cards workup with PFTs, high res CT lungs and echocardiogram. Appreciate care of all involved.       Encounter for chronic pain management - Primary    [place] CSRS reviewed Continue MSIR 15mg  2-3 times daily.       Dysphagia    Longstanding, ongoing. Initially attributed to cervical hardware, now undergoing further rheumatologic workup for limited systemic sclerosis ?related.      Drug-induced constipation    Continues miralax, linzess and movantik - ongoing need despite decrease in MMED to 30-45mg  daily - ?constipation related to systemic sclerosis. She may return to GI for further eval      Chronic patellofemoral pain of right knee    Discussing partial knee replacement with Dr Marlou Sa.       Chronic pain syndrome       Meds ordered this encounter  Medications  . DISCONTD: naloxegol oxalate (MOVANTIK) 25 MG TABS tablet    Sig: Take 1 tablet (25 mg total) by mouth daily.    Dispense:  30 tablet    Refill:  6    FOR NEXT TIME  . naloxegol oxalate (MOVANTIK) 25 MG TABS tablet    Sig: Take 1 tablet (25 mg total) by mouth daily.    Dispense:  90 tablet    Refill:  1    FOR NEXT TIME   No orders of the defined types were placed in this encounter.   Patient Instructions  Continue current regimen Return in December for physical. We will await upcoming work up for systemic sclerosis.   Follow up plan: Return if symptoms worsen or fail to improve.  Ria Bush, MD

## 2020-05-05 NOTE — Patient Instructions (Addendum)
Continue current regimen Return in December for physical. We will await upcoming work up for systemic sclerosis.

## 2020-05-08 ENCOUNTER — Other Ambulatory Visit: Payer: Self-pay

## 2020-05-08 ENCOUNTER — Ambulatory Visit
Admission: RE | Admit: 2020-05-08 | Discharge: 2020-05-08 | Disposition: A | Discharge status: Home / Self Care | Payer: Medicare Other | Source: Ambulatory Visit | Attending: Pulmonary Disease | Admitting: Pulmonary Disease

## 2020-05-08 DIAGNOSIS — M349 Systemic sclerosis, unspecified: Secondary | ICD-10-CM | POA: Diagnosis present

## 2020-05-12 ENCOUNTER — Other Ambulatory Visit: Payer: Self-pay | Admitting: Pulmonary Disease

## 2020-05-12 ENCOUNTER — Other Ambulatory Visit: Payer: Self-pay | Admitting: Family Medicine

## 2020-05-12 DIAGNOSIS — R911 Solitary pulmonary nodule: Secondary | ICD-10-CM

## 2020-05-13 ENCOUNTER — Other Ambulatory Visit (HOSPITAL_COMMUNITY): Payer: Self-pay | Admitting: *Deleted

## 2020-05-13 ENCOUNTER — Encounter: Payer: Self-pay | Admitting: Family Medicine

## 2020-05-13 DIAGNOSIS — M349 Systemic sclerosis, unspecified: Secondary | ICD-10-CM

## 2020-05-13 NOTE — Telephone Encounter (Signed)
ERx 

## 2020-05-13 NOTE — Progress Notes (Signed)
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

## 2020-05-15 ENCOUNTER — Telehealth: Payer: Self-pay | Admitting: Pulmonary Disease

## 2020-05-15 NOTE — Telephone Encounter (Signed)
Spoke with pt and reviewed My Chart message sent from Dr. Vaughan Browner with results of CT. Pt stated understanding. Nothing further needed at this time.

## 2020-05-15 NOTE — Telephone Encounter (Signed)
I called the phone number but got voicemail and left a message  The lung nodule is very small and sometimes we see that appear due to prior infection.  It looks benign and can be followed up in 1 year with a repeat scan. The airway inflammation is very mild and benign as well.  No treatment needed.

## 2020-05-15 NOTE — Telephone Encounter (Signed)
Received the following message from patient:   "Good afternoon Dr. Vaughan Browner,  I received a phone call from your office giving me the results of my CT scan.   I was advised of the inflammation in my airways and that I had a nodule.  Is there anything I need to be concerned about and what exactly does this mean? Thank you for your time!"  She was made aware of her results a few days but wanted to know more about the inflammation and the nodule. I did explain that the follow CT scan was to make sure that the nodule hasn't changed in size and to report if she noticed any changes in her breathing.   Dr. Vaughan Browner, can you please advise? Thanks!

## 2020-05-18 ENCOUNTER — Other Ambulatory Visit: Payer: Self-pay | Admitting: Rheumatology

## 2020-05-19 NOTE — Telephone Encounter (Signed)
Last Visit: 04/21/2020 Next Visit: 09/17/2020   Current Dose per office note 04/21/2020: norvasc 7.5 mg daily  DX: [name] disease without gangrene  Okay to refill Norvasc?

## 2020-05-20 ENCOUNTER — Other Ambulatory Visit: Payer: Self-pay

## 2020-05-21 ENCOUNTER — Encounter: Payer: Self-pay | Admitting: Family Medicine

## 2020-05-23 ENCOUNTER — Telehealth: Payer: Self-pay | Admitting: Orthopedic Surgery

## 2020-05-23 NOTE — Telephone Encounter (Signed)
Patient is scheduled at [facility] on 06-01-20 with Dr. Marlou Sa for right patellofemoral knee replacement.  She would like to know if she will start with physical therapy immediately in the hospital.  She is scheduled as OBS.  Demographic sheet provided to [place].  Patient says she did not have a great experience at a [place] facility and would like to consider using our facility upstairs for outpatient PT.   Please call patient at the following number to discuss   [phone]

## 2020-05-25 ENCOUNTER — Telehealth: Payer: Self-pay

## 2020-05-25 NOTE — Telephone Encounter (Signed)
Attempted to contact the patient and left message for patient to call the office.  

## 2020-05-25 NOTE — Telephone Encounter (Signed)
See below. Can you please discuss with patient? Thanks.

## 2020-05-25 NOTE — Telephone Encounter (Signed)
Patient called stating she picked up her prescription of Norvasc (1 tablet (30 day supply) on 05/18/20 at [place].  Patient states she picked up the prescription before the new prescription for 1 1/2 tablets (45 day supply) was sent to the pharmacy on 05/19/20.  Patient states she has a pill cutter and has been taking 1 1/2 tablets, but will be short 15 tablets.  Patient is requesting a return call.

## 2020-05-25 NOTE — Telephone Encounter (Signed)
Pt left v/m that she is scheduled for partial knee replacement thru ortho care Dr Marlou Sa on 06/01/20. Pt saw Dr Darnell Level recently and request either a note in my chart for surgical clearance, a letter to Dr Marlou Sa or Dr Deans office  can send over form to be filled out. Pt last seen 05/05/20. Pt request cb when taken care of.

## 2020-05-26 ENCOUNTER — Other Ambulatory Visit: Payer: Self-pay

## 2020-05-26 ENCOUNTER — Encounter (HOSPITAL_BASED_OUTPATIENT_CLINIC_OR_DEPARTMENT_OTHER): Payer: Self-pay | Admitting: Orthopedic Surgery

## 2020-05-26 NOTE — Telephone Encounter (Signed)
Please schedule preop visit for tomorrow at 12:30pm with EKG at that time.

## 2020-05-26 NOTE — Progress Notes (Signed)
Patient recently diagnosed with scleroderma and is undergoing pulmonary and cardiac evaluations. She has completed her pulmonary eval but does not go see cardiology until 06-22-20. Pt chart reviewed with Dr Christella Hartigan for upcoming partial knee replacement and states that if her PCP would do a medical clearance that she could proceed with surgery before cards evaluation. Patient states that she is seeing Dr Danise Mina 11-3 for clearance.

## 2020-05-27 ENCOUNTER — Other Ambulatory Visit: Payer: Self-pay

## 2020-05-27 ENCOUNTER — Telehealth: Payer: Self-pay | Admitting: *Deleted

## 2020-05-27 ENCOUNTER — Other Ambulatory Visit: Payer: Self-pay | Admitting: Rheumatology

## 2020-05-27 ENCOUNTER — Encounter: Payer: Self-pay | Admitting: Family Medicine

## 2020-05-27 ENCOUNTER — Ambulatory Visit (INDEPENDENT_AMBULATORY_CARE_PROVIDER_SITE_OTHER): Payer: Medicare Other | Admitting: Family Medicine

## 2020-05-27 ENCOUNTER — Ambulatory Visit: Payer: Medicare Other | Admitting: Family Medicine

## 2020-05-27 VITALS — BP 134/76 | HR 104 | Temp 98.2°F | Ht 64.0 in | Wt 127.2 lb

## 2020-05-27 DIAGNOSIS — G894 Chronic pain syndrome: Secondary | ICD-10-CM | POA: Diagnosis not present

## 2020-05-27 DIAGNOSIS — I7300 Raynaud's syndrome without gangrene: Secondary | ICD-10-CM | POA: Diagnosis not present

## 2020-05-27 DIAGNOSIS — M349 Systemic sclerosis, unspecified: Secondary | ICD-10-CM

## 2020-05-27 DIAGNOSIS — M25561 Pain in right knee: Secondary | ICD-10-CM | POA: Diagnosis not present

## 2020-05-27 DIAGNOSIS — G8929 Other chronic pain: Secondary | ICD-10-CM

## 2020-05-27 DIAGNOSIS — Z01818 Encounter for other preprocedural examination: Secondary | ICD-10-CM

## 2020-05-27 DIAGNOSIS — R911 Solitary pulmonary nodule: Secondary | ICD-10-CM | POA: Insufficient documentation

## 2020-05-27 LAB — CBC WITH DIFFERENTIAL/PLATELET
Basophils Absolute: 0.1 10*3/uL (ref 0.0–0.1)
Basophils Relative: 0.8 % (ref 0.0–3.0)
Eosinophils Absolute: 0.1 10*3/uL (ref 0.0–0.7)
Eosinophils Relative: 0.7 % (ref 0.0–5.0)
HCT: 38.3 % (ref 36.0–46.0)
Hemoglobin: 12.8 g/dL (ref 12.0–15.0)
Lymphocytes Relative: 19.3 % (ref 12.0–46.0)
Lymphs Abs: 1.9 10*3/uL (ref 0.7–4.0)
MCHC: 33.5 g/dL (ref 30.0–36.0)
MCV: 83.6 fl (ref 78.0–100.0)
Monocytes Absolute: 0.6 10*3/uL (ref 0.1–1.0)
Monocytes Relative: 5.5 % (ref 3.0–12.0)
Neutro Abs: 7.3 10*3/uL (ref 1.4–7.7)
Neutrophils Relative %: 73.7 % (ref 43.0–77.0)
Platelets: 348 10*3/uL (ref 150.0–400.0)
RBC: 4.58 Mil/uL (ref 3.87–5.11)
RDW: 14 % (ref 11.5–15.5)
WBC: 9.9 10*3/uL (ref 4.0–10.5)

## 2020-05-27 LAB — BASIC METABOLIC PANEL
BUN: 10 mg/dL (ref 6–23)
CO2: 28 mEq/L (ref 19–32)
Calcium: 9.5 mg/dL (ref 8.4–10.5)
Chloride: 97 mEq/L (ref 96–112)
Creatinine, Ser: 0.58 mg/dL (ref 0.40–1.20)
GFR: 97.04 mL/min (ref >60.00)
Glucose, Bld: 97 mg/dL (ref 70–99)
Potassium: 4.3 mEq/L (ref 3.5–5.1)
Sodium: 133 mEq/L — ABNORMAL LOW (ref 135–145)

## 2020-05-27 LAB — PROTIME-INR
INR: 1.1 ratio — ABNORMAL HIGH (ref 0.8–1.0)
Prothrombin Time: 12 s (ref 9.6–13.1)

## 2020-05-27 LAB — TSH: TSH: 1.29 u[IU]/mL (ref 0.35–4.50)

## 2020-05-27 LAB — BRAIN NATRIURETIC PEPTIDE: Pro B Natriuretic peptide (BNP): 19 pg/mL (ref 0.0–100.0)

## 2020-05-27 NOTE — Assessment & Plan Note (Signed)
Incidentally found on recent CT: 6 mm nodule of the right pulmonary apex. Non-contrast chest CT at 6-12 months is recommended. If the nodule is stable at time of repeat CT, then future CT at 18-24 months (from today's scan) is considered optional for low-risk patients, but is recommended for high-risk patients.

## 2020-05-27 NOTE — Assessment & Plan Note (Signed)
On amlodipine 7.5mg  daily.

## 2020-05-27 NOTE — Assessment & Plan Note (Addendum)
Undergoing work up.  Planning to see pulm and cards.  Recent largely reassuring high res CT scan and echocardiogram.

## 2020-05-27 NOTE — Patient Instructions (Addendum)
Labs and EKG today We will forward results to Dr Marlou Sa with the clearance.

## 2020-05-27 NOTE — Assessment & Plan Note (Signed)
Upcoming knee surgery Rachel Duran)

## 2020-05-27 NOTE — Assessment & Plan Note (Signed)
RCRI = 0 No CXR done given recent high res CT scan.  EKG today lists atrial flutter however I don't see this. I think she has normal sinus rhythm.  Check labs today.  Anticipate adequately low risk to proceed with surgery.

## 2020-05-27 NOTE — Telephone Encounter (Signed)
Last Visit: 04/21/2020 Next Visit: 09/17/2020  Last Fill: 12/09/2019  Okay to refill Lyrica?

## 2020-05-27 NOTE — Assessment & Plan Note (Addendum)
Current regimen is MSIR 15mg  2-3 times daily PRN.

## 2020-05-27 NOTE — Telephone Encounter (Signed)
Left message to advise patient her prescription for Lyrica has been sent to the pharmacy. Advised patient we sent the Norvasc prescription to the pharmacy for 45 tablets which should give her 1.5 tablets daily for 30 days. Advised it also has 2 refills. Advised patient to call with any additional questions or concerns.

## 2020-05-27 NOTE — Progress Notes (Signed)
This visit was conducted in person.  BP 134/76 (BP Location: Left Arm, Patient Position: Sitting, Cuff Size: Normal)   Pulse (!) 104   Temp 98.2 F (36.8 C) (Temporal)   Ht 5\' 4"  (1.626 m)   Wt 127 lb 3 oz (57.7 kg)   LMP 07/25/1998   SpO2 97%   BMI 21.83 kg/m   BP Readings from Last 3 Encounters:  05/27/20 134/76  05/05/20 122/74  04/27/20 126/76    Pulse Readings from Last 3 Encounters:  05/27/20 (!) 104  05/05/20 (!) 103  04/27/20 100   CC: preop eval Subjective:    Patient ID: Rachel Duran, female    DOB: [birth year], 64 y.o.   MRN: [id number]  HPI: Rachel Duran is a 64 y.o. female presenting on 05/27/2020 for Pre-op Exam   Upcoming planned partial R knee replacement and PF arthroplasty by Dr Marlou Sa for ongoing debilitating kneecap pain - planning spinal anesthesia with possible femoral nerve block.   Chronic pain - current regimen is morphine IR.  Undergoing evaluation for limited systemic sclerosis (pulm, cards), regularly sees rheumatology.  Recent high res CT lungs - no evidence of ILD. Mild tubular bronchiectasis and small airway disease.   Recent viral sinusitis has largely resolved with conservative management. Did not need antibiotic.   Denies chest pain/tightness, cough, dyspnea, HA, palpitations, leg swelling, dizziness, abd pain.   Has tolerated general anesthesia well in the past.  H/o post-op n/v, but recent surgeries have been well tolerated. No trouble waking up after anesthesia.      Relevant past medical, surgical, family and social history reviewed and updated as indicated. Interim medical history since our last visit reviewed. Allergies and medications reviewed and updated. Outpatient Medications Prior to Visit  Medication Sig Dispense Refill  . ALPRAZolam (XANAX) 1 MG tablet Take 0.5 mg by mouth at bedtime.   0  . amLODipine (NORVASC) 5 MG tablet Take 1.5 tablets (7.5 mg total) by mouth daily. 45 tablet 2  .  amphetamine-dextroamphetamine (ADDERALL XR) 30 MG 24 hr capsule TAKE 1 CAPSULE BY MOUTH EVERY MORNING 90 capsule 0  . ARIPiprazole (ABILIFY) 5 MG tablet Take 5 mg by mouth at bedtime.     . calcium-vitamin D (OSCAL WITH D) 500-200 MG-UNIT tablet Take 1 tablet by mouth 2 (two) times daily.    . cyanocobalamin (,VITAMIN B-12,) 1000 MCG/ML injection INJECT 1ML INTO THE MUSCLE EVERY 21 DAYSAS DIRECTED 3 mL 3  . diclofenac sodium (VOLTAREN) 1 % GEL Apply 2-4 grams to affected joint up to 4 times daily. 400 g 4  . Eszopiclone 3 MG TABS TAKE ONE TABLET AT BEDTIME 90 tablet 0  . ferrous sulfate 325 (65 FE) MG tablet Take 325 mg by mouth every Monday, Wednesday, and Friday.     . fluticasone (FLONASE) 50 MCG/ACT nasal spray Place 1 spray into both nostrils daily as needed for allergies. 16 g 5  . lamoTRIgine (LAMICTAL) 100 MG tablet Take 100 mg by mouth daily.    Marland Kitchen LINZESS 290 MCG CAPS capsule TAKE 1 CAPSULE BY MOUTH DAILY 90 capsule 1  . morphine (MSIR) 15 MG tablet TAKE 1 TABLET BY MOUTH THREE TIMES DAILYAS NEEDED FOR MODERATE OR SEVERE PAIN 90 tablet 0  . naloxegol oxalate (MOVANTIK) 25 MG TABS tablet Take 1 tablet (25 mg total) by mouth daily. 90 tablet 1  . NONFORMULARY OR COMPOUNDED ITEM Testosterone propionate 2% in white petrolatum, apply small amount once daily for 5 days per week.  60 each 0  . ondansetron (ZOFRAN) 4 MG tablet TAKE ONE TABLET EVERY EIGHT HOURS AS NEEDED FOR NAUSEA / VOMITING 30 tablet 1  . ondansetron (ZOFRAN-ODT) 4 MG disintegrating tablet TAKE 1 TABLET BY MOUTH EVERY 8 HOURS AS NEEDED FOR NAUSEA OR VOMITING 20 tablet 1  . pantoprazole (PROTONIX) 40 MG tablet TAKE 1 TABLET BY MOUTH TWICE DAILY WITH A MEAL 180 tablet 1  . pentosan polysulfate (ELMIRON) 100 MG capsule Take 200 mg by mouth 2 (two) times daily.     . phenazopyridine (PYRIDIUM) 100 MG tablet Take by mouth as needed.     . polyethylene glycol (MIRALAX) 17 g packet Take 17 g by mouth 3 (three) times daily.     .  Prasterone (INTRAROSA) 6.5 MG INST Place 1 suppository vaginally at bedtime. 84 each 3  . pregabalin (LYRICA) 150 MG capsule TAKE 1 CAPSULE BY MOUTH TWICE DAILY 180 capsule 0  . PREVIDENT 5000 BOOSTER PLUS 1.1 % PSTE See admin instructions.    . senna (SENOKOT) 8.6 MG tablet Take 2 tablets by mouth daily as needed.     . SF 5000 PLUS 1.1 % CREA dental cream Place 1 application onto teeth at bedtime.     . ursodiol (ACTIGALL) 300 MG capsule Take 1 capsule (300 mg total) by mouth 2 (two) times daily. 180 capsule 3   No facility-administered medications prior to visit.     Per HPI unless specifically indicated in ROS section below Review of Systems Objective:  BP 134/76 (BP Location: Left Arm, Patient Position: Sitting, Cuff Size: Normal)   Pulse (!) 104   Temp 98.2 F (36.8 C) (Temporal)   Ht 5\' 4"  (1.626 m)   Wt 127 lb 3 oz (57.7 kg)   LMP 07/25/1998   SpO2 97%   BMI 21.83 kg/m   Wt Readings from Last 3 Encounters:  05/27/20 127 lb 3 oz (57.7 kg)  05/05/20 129 lb 7 oz (58.7 kg)  04/27/20 130 lb 3.2 oz (59.1 kg)      Physical Exam Vitals and nursing note reviewed.  Constitutional:      Appearance: Normal appearance. She is not ill-appearing.  Eyes:     Extraocular Movements: Extraocular movements intact.     Pupils: Pupils are equal, round, and reactive to light.  Neck:     Thyroid: No thyroid mass or thyromegaly.  Cardiovascular:     Rate and Rhythm: Normal rate and regular rhythm.     Pulses: Normal pulses.     Heart sounds: Normal heart sounds. No murmur heard.   Pulmonary:     Effort: Pulmonary effort is normal. No respiratory distress.     Breath sounds: Normal breath sounds. No wheezing, rhonchi or rales.  Musculoskeletal:     Right lower leg: No edema.     Left lower leg: No edema.  Neurological:     Mental Status: She is alert.  Psychiatric:        Mood and Affect: Mood normal.        Behavior: Behavior normal.       Results for orders placed or  performed in visit on 62/13/08  Basic metabolic panel  Result Value Ref Range   Sodium 133 (L) 135 - 145 mEq/L   Potassium 4.3 3.5 - 5.1 mEq/L   Chloride 97 96 - 112 mEq/L   CO2 28 19 - 32 mEq/L   Glucose, Bld 97 70 - 99 mg/dL   BUN 10 6 - 23 mg/dL  Creatinine, Ser 0.58 0.40 - 1.20 mg/dL   GFR 97.04 >60.00 mL/min   Calcium 9.5 8.4 - 10.5 mg/dL  CBC with Differential/Platelet  Result Value Ref Range   WBC 9.9 4.0 - 10.5 K/uL   RBC 4.58 3.87 - 5.11 Mil/uL   Hemoglobin 12.8 12.0 - 15.0 g/dL   HCT 38.3 36 - 46 %   MCV 83.6 78.0 - 100.0 fl   MCHC 33.5 30.0 - 36.0 g/dL   RDW 14.0 11.5 - 15.5 %   Platelets 348.0 150 - 400 K/uL   Neutrophils Relative % 73.7 43 - 77 %   Lymphocytes Relative 19.3 12 - 46 %   Monocytes Relative 5.5 3 - 12 %   Eosinophils Relative 0.7 0 - 5 %   Basophils Relative 0.8 0 - 3 %   Neutro Abs 7.3 1.4 - 7.7 K/uL   Lymphs Abs 1.9 0.7 - 4.0 K/uL   Monocytes Absolute 0.6 0.1 - 1.0 K/uL   Eosinophils Absolute 0.1 0.0 - 0.7 K/uL   Basophils Absolute 0.1 0.0 - 0.1 K/uL  Brain natriuretic peptide  Result Value Ref Range   Pro B Natriuretic peptide (BNP) 19.0 0.0 - 100.0 pg/mL  Protime-INR  Result Value Ref Range   INR 1.1 (H) 0.8 - 1.0 ratio   Prothrombin Time 12.0 9.6 - 13.1 sec  TSH  Result Value Ref Range   TSH 1.29 0.35 - 4.50 uIU/mL   *Note: Due to a large number of results and/or encounters for the requested time period, some results have not been displayed. A complete set of results can be found in Results Review.   EKG - NSR rate 90s, normal axis, intervals, no acute ST/T changes.  Assessment & Plan:  This visit occurred during the SARS-CoV-2 public health emergency.  Safety protocols were in place, including screening questions prior to the visit, additional usage of staff PPE, and extensive cleaning of exam room while observing appropriate contact time as indicated for disinfecting solutions.   Problem List Items Addressed This Visit    [name]  syndrome    On amlodipine 7.5mg  daily.       Pulmonary nodule    Incidentally found on recent CT: 6 mm nodule of the right pulmonary apex. Non-contrast chest CT at 6-12 months is recommended. If the nodule is stable at time of repeat CT, then future CT at 18-24 months (from today's scan) is considered optional for low-risk patients, but is recommended for high-risk patients.       Pre-op evaluation - Primary    RCRI = 0 No CXR done given recent high res CT scan.  EKG today lists atrial flutter however I don't see this. I think she has normal sinus rhythm.  Check labs today.  Anticipate adequately low risk to proceed with surgery.       Relevant Orders   EKG 12-Lead (Completed)   Basic metabolic panel (Completed)   CBC with Differential/Platelet (Completed)   Brain natriuretic peptide (Completed)   Protime-INR (Completed)   TSH (Completed)   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[facility])    Undergoing work up.  Planning to see pulm and cards.  Recent largely reassuring high res CT scan and echocardiogram.       Chronic patellofemoral pain of right knee    Upcoming knee surgery Marlou Sa)      Chronic pain syndrome    Current regimen is MSIR 15mg  2-3 times daily PRN.           No  orders of the defined types were placed in this encounter.  Orders Placed This Encounter  Procedures  . Basic metabolic panel  . CBC with Differential/Platelet  . Brain natriuretic peptide  . Protime-INR  . TSH  . EKG 12-Lead    Patient Instructions  Labs and EKG today We will forward results to Dr Marlou Sa with the clearance.   Follow up plan: Return if symptoms worsen or fail to improve.  Ria Bush, MD

## 2020-05-28 ENCOUNTER — Encounter (HOSPITAL_COMMUNITY)
Admission: RE | Admit: 2020-05-28 | Discharge: 2020-05-28 | Disposition: A | Discharge status: Home / Self Care | Payer: Medicare Other | Source: Ambulatory Visit | Attending: Orthopaedic Surgery | Admitting: Orthopaedic Surgery

## 2020-05-28 ENCOUNTER — Encounter: Payer: Self-pay | Admitting: Family Medicine

## 2020-05-28 ENCOUNTER — Encounter (HOSPITAL_BASED_OUTPATIENT_CLINIC_OR_DEPARTMENT_OTHER)
Admission: RE | Admit: 2020-05-28 | Discharge: 2020-05-28 | Disposition: A | Discharge status: Home / Self Care | Payer: Medicare Other | Source: Ambulatory Visit | Attending: Orthopaedic Surgery | Admitting: Orthopaedic Surgery

## 2020-05-28 DIAGNOSIS — Z01812 Encounter for preprocedural laboratory examination: Secondary | ICD-10-CM | POA: Diagnosis not present

## 2020-05-28 DIAGNOSIS — Z20822 Contact with and (suspected) exposure to covid-19: Secondary | ICD-10-CM | POA: Diagnosis not present

## 2020-05-28 DIAGNOSIS — Z01818 Encounter for other preprocedural examination: Secondary | ICD-10-CM | POA: Insufficient documentation

## 2020-05-28 LAB — SARS CORONAVIRUS 2 (TAT 6-24 HRS): SARS Coronavirus 2: NEGATIVE

## 2020-05-28 LAB — SURGICAL PCR SCREEN
MRSA, PCR: NEGATIVE
Staphylococcus aureus: NEGATIVE

## 2020-05-28 NOTE — Telephone Encounter (Signed)
Seen yesterday

## 2020-05-28 NOTE — Telephone Encounter (Signed)
Faxed form to [place] at [phone], attn:  Debbie.

## 2020-05-28 NOTE — Telephone Encounter (Signed)
Opened in error; Disregard.

## 2020-06-01 ENCOUNTER — Encounter (HOSPITAL_BASED_OUTPATIENT_CLINIC_OR_DEPARTMENT_OTHER)
Admission: RE | Disposition: A | Discharge status: Home / Self Care | Payer: Self-pay | Source: Home / Self Care | Attending: Orthopedic Surgery

## 2020-06-01 ENCOUNTER — Observation Stay (HOSPITAL_COMMUNITY)
Admission: RE | Admit: 2020-06-01 | Discharge: 2020-06-02 | Disposition: A | Discharge status: Home / Self Care | Payer: Medicare Other | Attending: Orthopedic Surgery | Admitting: Orthopedic Surgery

## 2020-06-01 ENCOUNTER — Other Ambulatory Visit: Payer: Self-pay

## 2020-06-01 ENCOUNTER — Telehealth: Payer: Self-pay | Admitting: Family Medicine

## 2020-06-01 ENCOUNTER — Ambulatory Visit (HOSPITAL_BASED_OUTPATIENT_CLINIC_OR_DEPARTMENT_OTHER): Payer: Medicare Other | Admitting: Anesthesiology

## 2020-06-01 ENCOUNTER — Encounter (HOSPITAL_BASED_OUTPATIENT_CLINIC_OR_DEPARTMENT_OTHER): Payer: Self-pay | Admitting: Orthopedic Surgery

## 2020-06-01 DIAGNOSIS — I10 Essential (primary) hypertension: Secondary | ICD-10-CM | POA: Diagnosis not present

## 2020-06-01 DIAGNOSIS — M222X1 Patellofemoral disorders, right knee: Secondary | ICD-10-CM | POA: Diagnosis not present

## 2020-06-01 DIAGNOSIS — M1711 Unilateral primary osteoarthritis, right knee: Secondary | ICD-10-CM

## 2020-06-01 DIAGNOSIS — Z79899 Other long term (current) drug therapy: Secondary | ICD-10-CM | POA: Diagnosis not present

## 2020-06-01 DIAGNOSIS — M25561 Pain in right knee: Secondary | ICD-10-CM | POA: Diagnosis present

## 2020-06-01 DIAGNOSIS — Z9889 Other specified postprocedural states: Secondary | ICD-10-CM

## 2020-06-01 HISTORY — DX: Essential (primary) hypertension: I10

## 2020-06-01 HISTORY — PX: PARTIAL KNEE ARTHROPLASTY: SHX2174

## 2020-06-01 LAB — URINALYSIS, ROUTINE W REFLEX MICROSCOPIC
Bacteria, UA: NONE SEEN
Bilirubin Urine: NEGATIVE
Glucose, UA: NEGATIVE mg/dL
Hgb urine dipstick: NEGATIVE
Ketones, ur: NEGATIVE mg/dL
Nitrite: NEGATIVE
Protein, ur: NEGATIVE mg/dL
Specific Gravity, Urine: 1.021 (ref 1.005–1.030)
pH: 5 (ref 5.0–8.0)

## 2020-06-01 SURGERY — ARTHROPLASTY, KNEE, UNICOMPARTMENTAL
Anesthesia: Regional | Site: Knee | Laterality: Right

## 2020-06-01 MED ORDER — CEFAZOLIN SODIUM-DEXTROSE 2-4 GM/100ML-% IV SOLN
INTRAVENOUS | Status: AC
  Filled 2020-06-01: qty 100

## 2020-06-01 MED ORDER — LACTATED RINGERS IV SOLN: INTRAVENOUS | Status: DC

## 2020-06-01 MED ORDER — LAMOTRIGINE 100 MG PO TABS
100.0000 mg | ORAL_TABLET | Freq: Every day | ORAL | Status: DC
  Filled 2020-06-01: qty 1

## 2020-06-01 MED ORDER — OXYCODONE HCL 5 MG PO TABS: 5.0000 mg | ORAL_TABLET | ORAL | Status: DC | PRN

## 2020-06-01 MED ORDER — BUPIVACAINE-EPINEPHRINE (PF) 0.25% -1:200000 IJ SOLN
INTRAMUSCULAR | Status: DC | PRN
  Administered 2020-06-01: 10 mL

## 2020-06-01 MED ORDER — MORPHINE SULFATE (PF) 4 MG/ML IV SOLN
INTRAVENOUS | Status: AC
  Filled 2020-06-01: qty 2

## 2020-06-01 MED ORDER — OXYCODONE HCL 5 MG/5ML PO SOLN: 5.0000 mg | Freq: Once | ORAL | Status: DC | PRN

## 2020-06-01 MED ORDER — PHENYLEPHRINE 40 MCG/ML (10ML) SYRINGE FOR IV PUSH (FOR BLOOD PRESSURE SUPPORT)
PREFILLED_SYRINGE | INTRAVENOUS | Status: AC
  Filled 2020-06-01: qty 20

## 2020-06-01 MED ORDER — SODIUM CHLORIDE 0.9 % IR SOLN
Status: DC | PRN
  Administered 2020-06-01: 2000 mL

## 2020-06-01 MED ORDER — AMISULPRIDE (ANTIEMETIC) 5 MG/2ML IV SOLN: 10.0000 mg | Freq: Once | INTRAVENOUS | Status: DC | PRN

## 2020-06-01 MED ORDER — ASPIRIN 81 MG PO CHEW: 81.0000 mg | CHEWABLE_TABLET | Freq: Two times a day (BID) | ORAL | Status: DC

## 2020-06-01 MED ORDER — PROPOFOL 500 MG/50ML IV EMUL
INTRAVENOUS | Status: DC | PRN
  Administered 2020-06-01: 50 ug/kg/min via INTRAVENOUS

## 2020-06-01 MED ORDER — BUPIVACAINE LIPOSOME 1.3 % IJ SUSP
INTRAMUSCULAR | Status: AC
  Filled 2020-06-01: qty 20

## 2020-06-01 MED ORDER — HYDROMORPHONE HCL 1 MG/ML IJ SOLN
INTRAMUSCULAR | Status: DC | PRN
Start: 2020-06-01 — End: 2020-06-01
  Administered 2020-06-01 (×2): .25 mg via INTRAVENOUS

## 2020-06-01 MED ORDER — ONDANSETRON HCL 4 MG PO TABS: 4.0000 mg | ORAL_TABLET | Freq: Four times a day (QID) | ORAL | Status: DC | PRN

## 2020-06-01 MED ORDER — VANCOMYCIN HCL 1000 MG IV SOLR
INTRAVENOUS | Status: DC | PRN
  Administered 2020-06-01: 1000 mg via TOPICAL

## 2020-06-01 MED ORDER — DEXAMETHASONE SODIUM PHOSPHATE 10 MG/ML IJ SOLN
INTRAMUSCULAR | Status: AC
  Filled 2020-06-01: qty 1

## 2020-06-01 MED ORDER — SODIUM CHLORIDE 0.9 % IV SOLN
INTRAVENOUS | Status: DC | PRN
  Administered 2020-06-01: 40 mL

## 2020-06-01 MED ORDER — DEXAMETHASONE SODIUM PHOSPHATE 10 MG/ML IJ SOLN
INTRAMUSCULAR | Status: DC | PRN
  Administered 2020-06-01: 4 mg via INTRAVENOUS

## 2020-06-01 MED ORDER — BUPIVACAINE HCL (PF) 0.25 % IJ SOLN
INTRAMUSCULAR | Status: AC
  Filled 2020-06-01: qty 30

## 2020-06-01 MED ORDER — ARIPIPRAZOLE 5 MG PO TABS
5.0000 mg | ORAL_TABLET | Freq: Every day | ORAL | Status: DC
  Filled 2020-06-01: qty 1

## 2020-06-01 MED ORDER — TRANEXAMIC ACID 1000 MG/10ML IV SOLN
INTRAVENOUS | Status: DC | PRN
  Administered 2020-06-01: 2000 mg via TOPICAL

## 2020-06-01 MED ORDER — LINACLOTIDE 290 MCG PO CAPS
290.0000 ug | ORAL_CAPSULE | Freq: Every day | ORAL | Status: DC
  Filled 2020-06-01: qty 1

## 2020-06-01 MED ORDER — LIDOCAINE 2% (20 MG/ML) 5 ML SYRINGE
INTRAMUSCULAR | Status: DC | PRN
  Administered 2020-06-01: 20 mg via INTRAVENOUS

## 2020-06-01 MED ORDER — FLUTICASONE PROPIONATE 50 MCG/ACT NA SUSP
1.0000 | Freq: Every day | NASAL | Status: DC | PRN
  Filled 2020-06-01: qty 16

## 2020-06-01 MED ORDER — POVIDONE-IODINE 10 % EX SWAB: 2.0000 "application " | Freq: Once | CUTANEOUS | Status: DC

## 2020-06-01 MED ORDER — FENTANYL CITRATE (PF) 100 MCG/2ML IJ SOLN
INTRAMUSCULAR | Status: AC
  Filled 2020-06-01: qty 2

## 2020-06-01 MED ORDER — ROPIVACAINE HCL 5 MG/ML IJ SOLN
INTRAMUSCULAR | Status: DC | PRN
  Administered 2020-06-01: 30 mL via PERINEURAL

## 2020-06-01 MED ORDER — TRANEXAMIC ACID 1000 MG/10ML IV SOLN
2000.0000 mg | Freq: Once | INTRAVENOUS | Status: AC
  Administered 2020-06-01: 2000 mg via TOPICAL
  Filled 2020-06-01 (×2): qty 20

## 2020-06-01 MED ORDER — PREGABALIN 75 MG PO CAPS: 150.0000 mg | ORAL_CAPSULE | Freq: Two times a day (BID) | ORAL | Status: DC

## 2020-06-01 MED ORDER — HYDROMORPHONE HCL 1 MG/ML IJ SOLN
INTRAMUSCULAR | Status: AC
  Filled 2020-06-01: qty 0.5

## 2020-06-01 MED ORDER — HYDROMORPHONE HCL 1 MG/ML IJ SOLN: 0.2500 mg | INTRAMUSCULAR | Status: DC | PRN

## 2020-06-01 MED ORDER — CLONIDINE HCL (ANALGESIA) 100 MCG/ML EP SOLN
EPIDURAL | Status: DC | PRN
  Administered 2020-06-01: 50 ug

## 2020-06-01 MED ORDER — ACETAMINOPHEN 500 MG PO TABS
1000.0000 mg | ORAL_TABLET | Freq: Four times a day (QID) | ORAL | Status: DC
  Administered 2020-06-01 – 2020-06-02 (×3): 1000 mg via ORAL
  Filled 2020-06-01 (×3): qty 2

## 2020-06-01 MED ORDER — PHENOL 1.4 % MT LIQD: 1.0000 | OROMUCOSAL | Status: DC | PRN

## 2020-06-01 MED ORDER — POVIDONE-IODINE 7.5 % EX SOLN: Freq: Once | CUTANEOUS | Status: DC

## 2020-06-01 MED ORDER — METOCLOPRAMIDE HCL 5 MG/ML IJ SOLN: 5.0000 mg | Freq: Three times a day (TID) | INTRAMUSCULAR | Status: DC | PRN

## 2020-06-01 MED ORDER — AMLODIPINE BESYLATE 5 MG PO TABS
7.5000 mg | ORAL_TABLET | Freq: Every day | ORAL | Status: DC
  Filled 2020-06-01: qty 1

## 2020-06-01 MED ORDER — TRANEXAMIC ACID-NACL 1000-0.7 MG/100ML-% IV SOLN: 1000.0000 mg | INTRAVENOUS | Status: AC

## 2020-06-01 MED ORDER — PANTOPRAZOLE SODIUM 40 MG PO TBEC
40.0000 mg | DELAYED_RELEASE_TABLET | Freq: Two times a day (BID) | ORAL | Status: DC
  Filled 2020-06-01: qty 1

## 2020-06-01 MED ORDER — MORPHINE SULFATE 4 MG/ML IJ SOLN
INTRAMUSCULAR | Status: DC | PRN
  Administered 2020-06-01: 4 mg

## 2020-06-01 MED ORDER — METHOCARBAMOL 1000 MG/10ML IJ SOLN
500.0000 mg | Freq: Four times a day (QID) | INTRAVENOUS | Status: DC | PRN
  Filled 2020-06-01: qty 5

## 2020-06-01 MED ORDER — SODIUM CHLORIDE (PF) 0.9 % IJ SOLN
INTRAMUSCULAR | Status: AC
  Filled 2020-06-01: qty 20

## 2020-06-01 MED ORDER — ACETAMINOPHEN 500 MG PO TABS
ORAL_TABLET | ORAL | Status: AC
  Filled 2020-06-01: qty 2

## 2020-06-01 MED ORDER — SCOPOLAMINE 1 MG/3DAYS TD PT72
1.0000 | MEDICATED_PATCH | TRANSDERMAL | Status: DC
  Administered 2020-06-01: 1.5 mg via TRANSDERMAL

## 2020-06-01 MED ORDER — CEFAZOLIN SODIUM-DEXTROSE 2-4 GM/100ML-% IV SOLN
2.0000 g | INTRAVENOUS | Status: AC
  Administered 2020-06-01: 2 g via INTRAVENOUS

## 2020-06-01 MED ORDER — ONDANSETRON HCL 4 MG/2ML IJ SOLN
INTRAMUSCULAR | Status: AC
  Filled 2020-06-01: qty 2

## 2020-06-01 MED ORDER — CEFAZOLIN SODIUM-DEXTROSE 1-4 GM/50ML-% IV SOLN
1.0000 g | Freq: Three times a day (TID) | INTRAVENOUS | Status: AC
  Administered 2020-06-01: 1 g via INTRAVENOUS
  Filled 2020-06-01: qty 50

## 2020-06-01 MED ORDER — ACETAMINOPHEN 500 MG PO TABS
1000.0000 mg | ORAL_TABLET | Freq: Once | ORAL | Status: AC
  Administered 2020-06-01: 1000 mg via ORAL

## 2020-06-01 MED ORDER — PHENYLEPHRINE HCL (PRESSORS) 10 MG/ML IV SOLN
INTRAVENOUS | Status: DC | PRN
  Administered 2020-06-01 (×4): 80 ug via INTRAVENOUS

## 2020-06-01 MED ORDER — IRRISEPT - 450ML BOTTLE WITH 0.05% CHG IN STERILE WATER, USP 99.95% OPTIME
TOPICAL | Status: DC | PRN
  Administered 2020-06-01: 400 mL via TOPICAL

## 2020-06-01 MED ORDER — LIDOCAINE 2% (20 MG/ML) 5 ML SYRINGE
INTRAMUSCULAR | Status: AC
  Filled 2020-06-01: qty 5

## 2020-06-01 MED ORDER — NAPROXEN 250 MG PO TABS
250.0000 mg | ORAL_TABLET | Freq: Two times a day (BID) | ORAL | Status: DC
  Administered 2020-06-01: 250 mg via ORAL
  Filled 2020-06-01: qty 1

## 2020-06-01 MED ORDER — TRANEXAMIC ACID-NACL 1000-0.7 MG/100ML-% IV SOLN
INTRAVENOUS | Status: AC
  Filled 2020-06-01: qty 100

## 2020-06-01 MED ORDER — PROPOFOL 500 MG/50ML IV EMUL
INTRAVENOUS | Status: AC
  Filled 2020-06-01: qty 50

## 2020-06-01 MED ORDER — ONDANSETRON HCL 4 MG/2ML IJ SOLN
INTRAMUSCULAR | Status: DC | PRN
  Administered 2020-06-01: 4 mg via INTRAVENOUS

## 2020-06-01 MED ORDER — ONDANSETRON HCL 4 MG/2ML IJ SOLN: 4.0000 mg | Freq: Four times a day (QID) | INTRAMUSCULAR | Status: DC | PRN

## 2020-06-01 MED ORDER — MIDAZOLAM HCL 2 MG/2ML IJ SOLN
INTRAMUSCULAR | Status: AC
  Filled 2020-06-01: qty 2

## 2020-06-01 MED ORDER — METOCLOPRAMIDE HCL 5 MG PO TABS: 5.0000 mg | ORAL_TABLET | Freq: Three times a day (TID) | ORAL | Status: DC | PRN

## 2020-06-01 MED ORDER — POVIDONE-IODINE 10 % EX SWAB
2.0000 "application " | Freq: Once | CUTANEOUS | Status: AC
  Administered 2020-06-01: 2 via TOPICAL

## 2020-06-01 MED ORDER — PROPOFOL 10 MG/ML IV BOLUS
INTRAVENOUS | Status: AC
  Filled 2020-06-01: qty 20

## 2020-06-01 MED ORDER — PROPOFOL 10 MG/ML IV BOLUS
INTRAVENOUS | Status: DC | PRN
  Administered 2020-06-01: 130 mg via INTRAVENOUS

## 2020-06-01 MED ORDER — MORPHINE SULFATE 15 MG PO TABS: 15.0000 mg | ORAL_TABLET | Freq: Three times a day (TID) | ORAL | Status: DC

## 2020-06-01 MED ORDER — SCOPOLAMINE 1 MG/3DAYS TD PT72
MEDICATED_PATCH | TRANSDERMAL | Status: AC
  Filled 2020-06-01: qty 1

## 2020-06-01 MED ORDER — PROMETHAZINE HCL 25 MG/ML IJ SOLN: 6.2500 mg | INTRAMUSCULAR | Status: DC | PRN

## 2020-06-01 MED ORDER — PROPOFOL 500 MG/50ML IV EMUL
INTRAVENOUS | Status: AC
  Filled 2020-06-01: qty 250

## 2020-06-01 MED ORDER — VANCOMYCIN HCL 1000 MG IV SOLR
INTRAVENOUS | Status: AC
  Filled 2020-06-01: qty 1000

## 2020-06-01 MED ORDER — DOCUSATE SODIUM 100 MG PO CAPS
100.0000 mg | ORAL_CAPSULE | Freq: Two times a day (BID) | ORAL | Status: DC
  Filled 2020-06-01: qty 1

## 2020-06-01 MED ORDER — OXYCODONE HCL 5 MG PO TABS: 5.0000 mg | ORAL_TABLET | Freq: Once | ORAL | Status: DC | PRN

## 2020-06-01 MED ORDER — METHOCARBAMOL 500 MG PO TABS
500.0000 mg | ORAL_TABLET | Freq: Four times a day (QID) | ORAL | Status: DC | PRN
  Administered 2020-06-01 – 2020-06-02 (×2): 500 mg via ORAL
  Filled 2020-06-01 (×2): qty 1

## 2020-06-01 MED ORDER — MENTHOL 3 MG MT LOZG: 1.0000 | LOZENGE | OROMUCOSAL | Status: DC | PRN

## 2020-06-01 MED ORDER — CLONIDINE HCL (ANALGESIA) 100 MCG/ML EP SOLN
EPIDURAL | Status: AC
  Filled 2020-06-01: qty 10

## 2020-06-01 MED ORDER — ACETAMINOPHEN 325 MG PO TABS: 325.0000 mg | ORAL_TABLET | Freq: Four times a day (QID) | ORAL | Status: DC | PRN

## 2020-06-01 MED ORDER — DEXAMETHASONE SODIUM PHOSPHATE 10 MG/ML IJ SOLN
INTRAMUSCULAR | Status: DC | PRN
  Administered 2020-06-01: 10 mg

## 2020-06-01 MED ORDER — MIDAZOLAM HCL 2 MG/2ML IJ SOLN
1.0000 mg | Freq: Once | INTRAMUSCULAR | Status: AC
  Administered 2020-06-01: 2 mg via INTRAVENOUS

## 2020-06-01 MED ORDER — PHENYLEPHRINE 40 MCG/ML (10ML) SYRINGE FOR IV PUSH (FOR BLOOD PRESSURE SUPPORT)
PREFILLED_SYRINGE | INTRAVENOUS | Status: AC
  Filled 2020-06-01: qty 10

## 2020-06-01 SURGICAL SUPPLY — 92 items
APL SKNCLS STERI-STRIP NONHPOA (GAUZE/BANDAGES/DRESSINGS)
BAG DECANTER FOR FLEXI CONT (MISCELLANEOUS) ×2 IMPLANT
BANDAGE ESMARK 6X9 LF (GAUZE/BANDAGES/DRESSINGS) ×1 IMPLANT
BENZOIN TINCTURE PRP APPL 2/3 (GAUZE/BANDAGES/DRESSINGS) IMPLANT
BLADE SAG 18X100X1.27 (BLADE) ×1 IMPLANT
BLADE SAW SGTL 13.0X1.19X90.0M (BLADE) IMPLANT
BLADE SURG 10 STRL SS (BLADE) ×2 IMPLANT
BLADE SURG 15 STRL LF DISP TIS (BLADE) ×1 IMPLANT
BLADE SURG 15 STRL SS (BLADE) ×2
BNDG CMPR 9X6 STRL LF SNTH (GAUZE/BANDAGES/DRESSINGS) ×1
BNDG COHESIVE 6X5 TAN STRL LF (GAUZE/BANDAGES/DRESSINGS) ×2 IMPLANT
BNDG ELASTIC 4X5.8 VLCR STR LF (GAUZE/BANDAGES/DRESSINGS) ×2 IMPLANT
BNDG ESMARK 6X9 LF (GAUZE/BANDAGES/DRESSINGS) ×2
BOWL SMART MIX CTS (DISPOSABLE) IMPLANT
BUR SURG 4X8 MED (BURR) IMPLANT
BURR SURG 4X8 MED (BURR)
COOLER ICEMAN CLASSIC (MISCELLANEOUS) ×2 IMPLANT
COVER SURGICAL LIGHT HANDLE (MISCELLANEOUS) ×1 IMPLANT
CUFF TOURN SGL QUICK 24 (TOURNIQUET CUFF) ×2
CUFF TOURN SGL QUICK 34 (TOURNIQUET CUFF)
CUFF TRNQT CYL 24X4X16.5-23 (TOURNIQUET CUFF) IMPLANT
CUFF TRNQT CYL 34X4.125X (TOURNIQUET CUFF) ×1 IMPLANT
DECANTER SPIKE VIAL GLASS SM (MISCELLANEOUS) ×1 IMPLANT
DRAPE EXTREMITY T 121X128X90 (DISPOSABLE) ×2 IMPLANT
DRAPE INCISE IOBAN 66X45 STRL (DRAPES) ×2 IMPLANT
DRAPE U-SHAPE 47X51 STRL (DRAPES) ×2 IMPLANT
DRAPE U-SHAPE 76X120 STRL (DRAPES) ×4 IMPLANT
DRSG AQUACEL AG ADV 3.5X 6 (GAUZE/BANDAGES/DRESSINGS) ×1 IMPLANT
DRSG AQUACEL AG ADV 3.5X10 (GAUZE/BANDAGES/DRESSINGS) ×1 IMPLANT
DURAPREP 26ML APPLICATOR (WOUND CARE) ×2 IMPLANT
ELECT CAUTERY BLADE 6.4 (BLADE) ×1 IMPLANT
ELECT REM PT RETURN 9FT ADLT (ELECTROSURGICAL) ×2
ELECTRODE REM PT RTRN 9FT ADLT (ELECTROSURGICAL) ×1 IMPLANT
GAUZE SPONGE 4X4 12PLY STRL (GAUZE/BANDAGES/DRESSINGS) ×2 IMPLANT
GLOVE BIO SURGEON STRL SZ7 (GLOVE) ×5 IMPLANT
GLOVE BIOGEL PI IND STRL 7.0 (GLOVE) ×1 IMPLANT
GLOVE BIOGEL PI IND STRL 8 (GLOVE) ×1 IMPLANT
GLOVE BIOGEL PI INDICATOR 7.0 (GLOVE) ×1
GLOVE BIOGEL PI INDICATOR 8 (GLOVE) ×1
GLOVE ECLIPSE 6.5 STRL STRAW (GLOVE) ×1 IMPLANT
GLOVE SURG ORTHO 8.0 STRL STRW (GLOVE) ×3 IMPLANT
GOWN STRL REUS W/ TWL LRG LVL3 (GOWN DISPOSABLE) ×2 IMPLANT
GOWN STRL REUS W/ TWL XL LVL3 (GOWN DISPOSABLE) ×1 IMPLANT
GOWN STRL REUS W/TWL LRG LVL3 (GOWN DISPOSABLE) ×4
GOWN STRL REUS W/TWL XL LVL3 (GOWN DISPOSABLE) ×2
HANDPIECE INTERPULSE COAX TIP (DISPOSABLE) ×2
HOOD PEEL AWAY FLYTE STAYCOOL (MISCELLANEOUS) ×4 IMPLANT
IMMOBILIZER KNEE 20 (SOFTGOODS)
IMMOBILIZER KNEE 20 THIGH 36 (SOFTGOODS) IMPLANT
IMMOBILIZER KNEE 22 UNIV (SOFTGOODS) ×1 IMPLANT
IMMOBILIZER KNEE 24 THIGH 36 (MISCELLANEOUS) IMPLANT
IMMOBILIZER KNEE 24 UNIV (MISCELLANEOUS)
IMPL FEMORAL KNEE TROCH XLG (Orthopedic Implant) IMPLANT
IMPLANT FEMORAL KNEE TROCH XLG (Orthopedic Implant) ×2 IMPLANT
KIT BASIN OR (CUSTOM PROCEDURE TRAY) ×2 IMPLANT
KIT PIN (KITS) ×1 IMPLANT
KNEE PATELLA ASYMMETRIC 10X32 (Knees) ×1 IMPLANT
MANIFOLD NEPTUNE II (INSTRUMENTS) ×2 IMPLANT
NDL SPNL 18GX3.5 QUINCKE PK (NEEDLE) ×1 IMPLANT
NEEDLE HYPO 22GX1.5 SAFETY (NEEDLE) ×1 IMPLANT
NEEDLE SPNL 18GX3.5 QUINCKE PK (NEEDLE) ×2 IMPLANT
NS IRRIG 1000ML POUR BTL (IV SOLUTION) ×2 IMPLANT
PACK TOTAL JOINT (CUSTOM PROCEDURE TRAY) ×2 IMPLANT
PAD COLD SHLDR WRAP-ON (PAD) ×1 IMPLANT
PADDING CAST COTTON 6X4 STRL (CAST SUPPLIES) ×2 IMPLANT
POST TAPER 11MM (Orthopedic Implant) ×1 IMPLANT
SET HNDPC FAN SPRY TIP SCT (DISPOSABLE) ×1 IMPLANT
SHEET MEDIUM DRAPE 40X70 STRL (DRAPES) IMPLANT
SLEEVE SCD COMPRESS KNEE MED (MISCELLANEOUS) ×2 IMPLANT
SPONGE LAP 18X18 RF (DISPOSABLE) ×2 IMPLANT
STRIP CLOSURE SKIN 1/2X4 (GAUZE/BANDAGES/DRESSINGS) ×4 IMPLANT
SUCTION FRAZIER HANDLE 10FR (MISCELLANEOUS) ×2
SUCTION TUBE FRAZIER 10FR DISP (MISCELLANEOUS) ×1 IMPLANT
SUT MNCRL AB 3-0 PS2 18 (SUTURE) ×2 IMPLANT
SUT MNCRL AB 3-0 PS2 27 (SUTURE) ×1 IMPLANT
SUT VIC AB 0 CT1 27 (SUTURE) ×4
SUT VIC AB 0 CT1 27XBRD ANBCTR (SUTURE) ×2 IMPLANT
SUT VIC AB 0 CT1 27XCR 8 STRN (SUTURE) ×1 IMPLANT
SUT VIC AB 1 CT1 27 (SUTURE) ×10
SUT VIC AB 1 CT1 27XBRD ANBCTR (SUTURE) ×2 IMPLANT
SUT VIC AB 2-0 CT1 27 (SUTURE) ×6
SUT VIC AB 2-0 CT1 TAPERPNT 27 (SUTURE) ×3 IMPLANT
SUT VICRYL 0 UR6 27IN ABS (SUTURE) IMPLANT
SYR 10ML LL (SYRINGE) ×1 IMPLANT
SYR 20ML LL LF (SYRINGE) ×1 IMPLANT
SYR 30ML LL (SYRINGE) ×1 IMPLANT
SYR TB 1ML LL NO SAFETY (SYRINGE) ×2 IMPLANT
TOWEL GREEN STERILE FF (TOWEL DISPOSABLE) ×6 IMPLANT
TRAY CATH 16FR W/PLASTIC CATH (SET/KITS/TRAYS/PACK) IMPLANT
TRAY DSU PREP LF (CUSTOM PROCEDURE TRAY) ×1 IMPLANT
UNDERPAD 30X36 HEAVY ABSORB (UNDERPADS AND DIAPERS) ×1 IMPLANT
YANKAUER SUCT BULB TIP NO VENT (SUCTIONS) ×2 IMPLANT

## 2020-06-01 NOTE — Anesthesia Postprocedure Evaluation (Signed)
Anesthesia Post Note  Patient: Africa Masaki  Procedure(s) Performed: Right knee patellofemoral replacement (Right Knee)     Patient location during evaluation: PACU Anesthesia Type: Regional and General Level of consciousness: awake and alert, oriented and patient cooperative Pain management: pain level controlled Vital Signs Assessment: post-procedure vital signs reviewed and stable Respiratory status: spontaneous breathing, nonlabored ventilation and respiratory function stable Cardiovascular status: blood pressure returned to baseline and stable Postop Assessment: no apparent nausea or vomiting Anesthetic complications: no   No complications documented.  Last Vitals:  Vitals:   06/01/20 0805 06/01/20 1050  BP: 124/75 90/69  Pulse: 66 75  Resp: 12 (!) 9  Temp:    SpO2: 100% 96%    Last Pain:  Vitals:   06/01/20 7062  TempSrc: Oral  PainSc: [place]

## 2020-06-01 NOTE — Brief Op Note (Signed)
   06/01/2020  10:52 AM  PATIENT:  Archana Eckman  64 y.o. female  PRE-OPERATIVE DIAGNOSIS:  right knee patellofemoral arthritis  POST-OPERATIVE DIAGNOSIS:  right knee patellofemoral arthritis  PROCEDURE:  Procedure(s): Right knee patellofemoral replacement  SURGEON:  Surgeon(s): [name], MD  ASSISTANT: magnant pa  ANESTHESIA:   general  EBL: 25 ml    Total I/O In: 1000 [I.V.:1000] Out: -   BLOOD ADMINISTERED: none  DRAINS: none   LOCAL MEDICATIONS USED:  none  SPECIMEN:  No Specimen  COUNTS:  YES  TOURNIQUET:   Total Tourniquet Time Documented: Thigh (Right) - 58 minutes Total: Thigh (Right) - 58 minutes   DICTATION: .Other Dictation: Dictation Number [id number]  PLAN OF CARE: Admit for overnight observation  PATIENT DISPOSITION:  PACU - hemodynamically stable

## 2020-06-01 NOTE — Telephone Encounter (Signed)
LVM for pt to rtn my call to R/S appt with [facility] on 07/02/20.

## 2020-06-01 NOTE — H&P (Signed)
Rachel Duran is an 64 y.o. female.   Chief Complaint: Right knee pain HPI: Rachel Duran is a 64 year old patient with right knee pain.  Knee pain has been ongoing for many years.  She has had multiple injections and conservative treatment for this problem.  She does have seronegative rheumatoid arthritis.  She also is in pain management for back related issues.  She has done well with total hip replacement.  She presents now for right patellofemoral replacement.  MRI scan shows maintenance of the medial and lateral compartments with no meniscal pathology but significant patellofemoral arthritis.  She describes difficulty going up and down stairs as well as pain with activities of daily living.  Past Medical History:  Diagnosis Date  . Abdominal pain last 4 months   and nausea also  . Allergy   . Anemia    history of  . Anxiety   . Bipolar disorder ([place])    atpical bipolar disorder  . 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 C6- C7 -hx. past fusion(bone graft used)  . Cholecystitis   . Chronic pain   . DDD (degenerative disc disease), lumbar 09/2015   dextroscoliosis with multilevel DDD and facet arthrosis most notable for R foraminal disc protrusion L4/5 producing severe R neural foraminal stenosis abutting R L4 nerve root, moderate spinal canal and mild lat recesss and R neural foraminal stenosis L3/4 (MRI)  . Depression    bipolar depression  . Disorders of porphyrin metabolism   . Felon of finger of left hand 11/10/2016  . Fibromyalgia   . GERD (gastroesophageal reflux disease)   . Headache    occasionally  . Hypertension   . Internal hemorrhoids   . Interstitial cystitis 06-06-12   hx.  . Irritable bowel syndrome   . PONV (postoperative nausea and vomiting)    now uses stomach blockers and no ponv  . 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 Raynauds disease    hx.  . Seronegative arthritis    [name]  . 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 ANTERIOR CERVICAL DECOMP/DISCECTOMY FUSION  2004   C5/6, C6/7  . ANTERIOR CERVICAL DECOMP/DISCECTOMY FUSION  02/2016   C3/4, C4/5 with plating [name])  . AUGMENTATION MAMMAPLASTY Bilateral 03/25/2010  . BREAST ENHANCEMENT SURGERY  2010  . BREAST IMPLANT EXCHANGE  10/2014   exchange saline implants, B mastopexy/capsulorraphy ([place])  . BUNIONECTOMY Bilateral yrs ago  . [place]   x 1  . CHOLECYSTECTOMY  06/11/2012   Procedure: LAPAROSCOPIC CHOLECYSTECTOMY WITH INTRAOPERATIVE CHOLANGIOGRAM;  Surgeon: [name], MD,FACS;  Location: [facility];  Service: General;  Laterality: N/A;  . COLONOSCOPY  02/2018   done for positive cologuard - WNL, rpt 10 yrs Fuller Plan)  . CYSTOSCOPY    . ERCP  05/22/2012   Procedure: ENDOSCOPIC RETROGRADE CHOLANGIOPANCREATOGRAPHY (ERCP);  Surgeon: [name], MD,FACG;  Location: [facility];  Service: Endoscopy;  Laterality: N/A;  . ERCP N/A 09/17/2013   Procedure: ENDOSCOPIC RETROGRADE CHOLANGIOPANCREATOGRAPHY (ERCP);  Surgeon: [name], MD;  Location: [facility];  Service: Endoscopy;  Laterality: N/A;  . ERCP N/A 09/27/2018   Procedure: ENDOSCOPIC RETROGRADE CHOLANGIOPANCREATOGRAPHY (ERCP);  Surgeon: [name], MD;  Location: [facility];  Service: Endoscopy;  Laterality: N/A;  . ESOPHAGOGASTRODUODENOSCOPY  09/2016   WNL. esophagus dilated [facility] Plan)  . HEMORRHOID BANDING  09-23-13   --Dr. Greer Pickerel  . HERNIA REPAIR     inguinal  . HYSTEROSCOPY W/ ENDOMETRIAL ABLATION    . NASAL SINUS SURGERY     x5  . REMOVAL OF STONES  09/27/2018   Procedure: REMOVAL OF STONES;  Surgeon: [name], MD;  Location: [facility];  Service: Endoscopy;;  . [name]  09/27/2018   Procedure:  SPHINCTEROTOMY;  Surgeon: [name], MD;  Location: [facility];  Service: Endoscopy;;  . TOTAL HIP ARTHROPLASTY Left 03/12/2019   Procedure: LEFT TOTAL HIP ARTHROPLASTY ANTERIOR APPROACH;  [name], [name], MD)  . UPPER GASTROINTESTINAL ENDOSCOPY      Family History  Problem Relation Age of Onset  . CAD Father 74       MI, nonsmoker  . Esophageal cancer Father 66  . Stomach cancer Father   . Scleroderma Mother   . Hypertension Mother   . Esophageal cancer Paternal Grandfather   . Stomach cancer Paternal Grandfather   . Diabetes Maternal Grandmother   . Arthritis Brother   . Stroke Neg Hx   . Colon cancer Neg Hx   . Rectal cancer Neg Hx    Social History:  reports that she has never smoked. She has never used smokeless tobacco. She reports that she does not drink alcohol and does not use drugs.  Allergies:  Allergies  Allergen Reactions  . Inh [Isoniazid] Other (See Comments)    Hepatitis  . Cymbalta [Duloxetine Hcl] Other (See Comments)    Urinary retention  . Nucynta [Tapentadol] Other (See Comments)    Agitation, jerking in legs  . Silenor [Doxepin Hcl] Other (See Comments)    nightmare, dizziness, stumbling upon walking, not effective  . Benzoin Dermatitis  . Fentanyl Other (See Comments)    Cold intolerance, arms twitching, insomnia, fatigue  . Celebrex [Celecoxib] Other (See Comments)    Headaches  . Codeine Phosphate Nausea And Vomiting and Other (See Comments)    Severe n&v  . Lithium Other (See Comments)    Sores in mouth   . Meperidine Hcl Rash and Other (See Comments)    REACTION: red skin  . Sulfamethoxazole Rash    Medications Prior to Admission  Medication Sig Dispense Refill  . ALPRAZolam (XANAX) 1 MG tablet Take 0.5 mg by mouth at bedtime.   0  . amLODipine (NORVASC) 5 MG tablet Take 1.5 tablets (7.5 mg total) by mouth daily. 45 tablet 2  . amphetamine-dextroamphetamine (ADDERALL XR) 30 MG 24 hr capsule TAKE 1 CAPSULE BY MOUTH EVERY  MORNING 90 capsule 0  . ARIPiprazole (ABILIFY) 5 MG tablet Take 5 mg by mouth at bedtime.     . calcium-vitamin D (OSCAL WITH D) 500-200 MG-UNIT tablet Take 1 tablet by mouth 2 (two) times daily.    . cyanocobalamin (,VITAMIN B-12,) 1000 MCG/ML injection INJECT 1ML INTO THE MUSCLE EVERY 21 DAYSAS DIRECTED 3 mL 3  . diclofenac sodium (VOLTAREN) 1 % GEL Apply 2-4 grams to affected joint up to 4 times daily. 400 g 4  . Eszopiclone 3 MG TABS TAKE ONE TABLET AT BEDTIME 90 tablet 0  . ferrous sulfate 325 (65 FE) MG tablet Take 325 mg by mouth every Monday, Wednesday, and Friday.     . fluticasone (FLONASE) 50 MCG/ACT nasal spray Place 1 spray into both nostrils daily as needed for  allergies. 16 g 5  . lamoTRIgine (LAMICTAL) 100 MG tablet Take 100 mg by mouth daily.    Marland Kitchen LINZESS 290 MCG CAPS capsule TAKE 1 CAPSULE BY MOUTH DAILY 90 capsule 1  . morphine (MSIR) 15 MG tablet TAKE 1 TABLET BY MOUTH THREE TIMES DAILYAS NEEDED FOR MODERATE OR SEVERE PAIN 90 tablet 0  . naloxegol oxalate (MOVANTIK) 25 MG TABS tablet Take 1 tablet (25 mg total) by mouth daily. 90 tablet 1  . NONFORMULARY OR COMPOUNDED ITEM Testosterone propionate 2% in white petrolatum, apply small amount once daily for 5 days per week. 60 each 0  . ondansetron (ZOFRAN) 4 MG tablet TAKE ONE TABLET EVERY EIGHT HOURS AS NEEDED FOR NAUSEA / VOMITING 30 tablet 1  . pantoprazole (PROTONIX) 40 MG tablet TAKE 1 TABLET BY MOUTH TWICE DAILY WITH A MEAL 180 tablet 1  . pentosan polysulfate (ELMIRON) 100 MG capsule Take 200 mg by mouth 2 (two) times daily.     . phenazopyridine (PYRIDIUM) 100 MG tablet Take by mouth as needed.     . polyethylene glycol (MIRALAX) 17 g packet Take 17 g by mouth 3 (three) times daily.     . Prasterone (INTRAROSA) 6.5 MG INST Place 1 suppository vaginally at bedtime. 84 each 3  . PREVIDENT 5000 BOOSTER PLUS 1.1 % PSTE See admin instructions.    . senna (SENOKOT) 8.6 MG tablet Take 2 tablets by mouth daily as needed.     .  SF 5000 PLUS 1.1 % CREA dental cream Place 1 application onto teeth at bedtime.     . ursodiol (ACTIGALL) 300 MG capsule Take 1 capsule (300 mg total) by mouth 2 (two) times daily. 180 capsule 3  . ondansetron (ZOFRAN-ODT) 4 MG disintegrating tablet TAKE 1 TABLET BY MOUTH EVERY 8 HOURS AS NEEDED FOR NAUSEA OR VOMITING 20 tablet 1  . pregabalin (LYRICA) 150 MG capsule TAKE 1 CAPSULE BY MOUTH TWICE DAILY 180 capsule 0    No results found. However, due to the size of the patient record, not all encounters were searched. Please check Results Review for a complete set of results. No results found.  Review of Systems  Musculoskeletal: Positive for arthralgias and back pain.  All other systems reviewed and are negative.   Height 5\' 4"  (1.626 m), weight 59 kg, last menstrual period 07/25/1998. Physical Exam Vitals reviewed.  HENT:     Head: Normocephalic.     Nose: Nose normal.     Mouth/Throat:     Mouth: Mucous membranes are moist.  Eyes:     Pupils: Pupils are equal, round, and reactive to light.  Cardiovascular:     Rate and Rhythm: Normal rate.     Pulses: Normal pulses.  Pulmonary:     Effort: Pulmonary effort is normal.  Abdominal:     General: Abdomen is flat.  Musculoskeletal:     Cervical back: Normal range of motion.  Skin:    General: Skin is warm.     Capillary Refill: Capillary refill takes less than 2 seconds.  Neurological:     General: No focal deficit present.     Mental Status: She is alert.  Psychiatric:        Mood and Affect: Mood normal.   Examination of the right knee demonstrates good range of motion full extension to 120 of flexion.  Significant patellofemoral crepitus is present on the right less so on the left.  Collateral and cruciate ligaments are stable.  Pedal pulses palpable.  No other masses lymphadenopathy or skin changes noted in the right knee region.  Assessment/Plan Impression is isolated patellofemoral arthritis based on exam and  radiographic and MRI findings.  Patient is failed conservative measures.  She presents now for patellofemoral arthroplasty.  Risk benefits are discussed include not limited to infection nerve vessel damage knee stiffness incomplete pain relief as well as potential need for revision to total knee replacement in the future.  Patient understands risk benefits and wishes to proceed.  All questions answered.  The patient's pain management may be problematic in the postoperative period.  Patient has been on opioid pain medicine which could make pain control difficult in the immediate postop.  Plan to use Exparel intra-articularly among other pain relieving agents for postop pain relief.  Anderson Malta, MD 06/01/2020, 6:55 AM

## 2020-06-01 NOTE — Anesthesia Procedure Notes (Signed)
Procedure Name: LMA Insertion Date/Time: 06/01/2020 8:37 AM Performed by: Signe Colt, CRNA Pre-anesthesia Checklist: Patient identified, Emergency Drugs available, Suction available and Patient being monitored Patient Re-evaluated:Patient Re-evaluated prior to induction Oxygen Delivery Method: Circle System Utilized Preoxygenation: Pre-oxygenation with 100% oxygen Induction Type: IV induction Ventilation: Mask ventilation without difficulty LMA: LMA inserted LMA Size: 3.0 Number of attempts: 1 Airway Equipment and Method: bite block Placement Confirmation: positive ETCO2 Tube secured with: Tape Dental Injury: Teeth and Oropharynx as per pre-operative assessment

## 2020-06-01 NOTE — Evaluation (Signed)
Physical Therapy Evaluation Patient Details Name: Rachel Duran MRN: [id number] DOB: [birth year] Today's Date: 06/01/2020   History of Present Illness  Pt is a 64 y/o female s/p R unicompartmental knee replacement. PMH includes bipolar, fibromyalgia, HTN, and raynauds.   Clinical Impression  Pt admitted secondary to problem above with deficits below. Pt tolerated gait and stair training well this session. Required min guard A for safety with use of RW. Reviewed HEP handout with pt and pt's husband and educated about knee precautions. Pt reports HHPT is scheduled to come at d/c. Reports her husband can assist as needed. No further skilled acute PT needs. Will sign off. If needs change, please re-consult.     Follow Up Recommendations Follow surgeon's recommendation for DC plan and follow-up therapies;Supervision for mobility/OOB    Equipment Recommendations  None recommended by PT    Recommendations for Other Services       Precautions / Restrictions Precautions Precautions: Knee Precaution Booklet Issued: Yes (comment) Precaution Comments: Reviewed knee precautions with pt also reviewed TKA HEP with pt  Restrictions Weight Bearing Restrictions: Yes RLE Weight Bearing: Weight bearing as tolerated      Mobility  Bed Mobility Overal bed mobility: Needs Assistance Bed Mobility: Supine to Sit;Sit to Supine     Supine to sit: Supervision Sit to supine: Supervision   General bed mobility comments: Supervision for safety.     Transfers Overall transfer level: Needs assistance Equipment used: Rolling walker (2 wheeled) Transfers: Sit to/from Stand Sit to Stand: Min guard         General transfer comment: Min guard A for safety. No physical assist required. Cues for safe hand placement.   Ambulation/Gait Ambulation/Gait assistance: Min guard Gait Distance (Feet): 100 Feet Assistive device: Rolling walker (2 wheeled) Gait Pattern/deviations: Step-to  pattern;Decreased step length - right;Decreased step length - left;Decreased weight shift to right;Antalgic Gait velocity: Decreased   General Gait Details: Slow, mildly antalgic gait. Cues for sequencing using RW. Min guard for safety.   Stairs Stairs: Yes Stairs assistance: Min guard Stair Management: Step to pattern;Forwards;With walker Number of Stairs: 1 General stair comments: Practiced threshold step with pt using portable step. Cues for LE sequencing. Min guard for safety, no physical assist required.   Wheelchair Mobility    Modified Rankin (Stroke Patients Only)       Balance Overall balance assessment: Mild deficits observed, not formally tested                                           Pertinent Vitals/Pain Pain Assessment: 0-10 Pain Score: 2  Pain Location: R knee Pain Descriptors / Indicators: Aching;Operative site guarding Pain Intervention(s): Limited activity within patient's tolerance;Monitored during session;Repositioned    Home Living Family/patient expects to be discharged to:: Private residence Living Arrangements: Spouse/significant other Available Help at Discharge: Family Type of Home: House Home Access: Stairs to enter   [employer] of Steps: 1 (threshold) Home Layout: Two level;Able to live on main level with bedroom/bathroom Home Equipment: Shower seat;Bedside commode;Walker - 2 wheels      Prior Function Level of Independence: Independent               Hand Dominance        Extremity/Trunk Assessment   Upper Extremity Assessment Upper Extremity Assessment: Overall WFL for tasks assessed    Lower Extremity Assessment Lower Extremity Assessment:  RLE deficits/detail RLE Deficits / Details: Deficits consistent with post op pain and weakness.        Communication   Communication: No difficulties  Cognition Arousal/Alertness: Awake/alert Behavior During Therapy: WFL for tasks  assessed/performed Overall Cognitive Status: Within Functional Limits for tasks assessed                                        General Comments General comments (skin integrity, edema, etc.): [name] present during session     Exercises     Assessment/Plan    PT Assessment Patent does not need any further PT services;All further PT needs can be met in the next venue of care  PT Problem List Decreased strength;Decreased range of motion;Decreased balance;Decreased mobility;Decreased knowledge of precautions;Pain       PT Treatment Interventions      PT Goals (Current goals can be found in the Care Plan section)  Acute Rehab PT Goals Patient Stated Goal: to go home PT Goal Formulation: With patient Time For Goal Achievement: 06/01/20 Potential to Achieve Goals: Good    Frequency     Barriers to discharge        Co-evaluation               AM-PAC PT "6 Clicks" Mobility  Outcome Measure Help needed turning from your back to your side while in a flat bed without using bedrails?: None Help needed moving from lying on your back to sitting on the side of a flat bed without using bedrails?: None Help needed moving to and from a bed to a chair (including a wheelchair)?: A Little Help needed standing up from a chair using your arms (e.g., wheelchair or bedside chair)?: A Little Help needed to walk in hospital room?: A Little Help needed climbing 3-5 steps with a railing? : A Little 6 Click Score: 20    End of Session Equipment Utilized During Treatment: Gait belt Activity Tolerance: Patient tolerated treatment well Patient left: in bed;with call bell/phone within reach;with family/visitor present Nurse Communication: Mobility status PT Visit Diagnosis: Other abnormalities of gait and mobility (R26.89)    Time: 0932-3557 PT Time Calculation (min) (ACUTE ONLY): 34 min   Charges:   PT Evaluation $PT Eval Low Complexity: 1 Low PT  Treatments $Gait Training: 8-22 mins        Rachel Duran, DPT  Acute Rehabilitation Services  Pager: [phone] Office: [phone]   Rachel Duran 06/01/2020, 2:21 PM

## 2020-06-01 NOTE — Transfer of Care (Signed)
Immediate Anesthesia Transfer of Care Note  Patient: Rachel Duran  Procedure(s) Performed: Right knee patellofemoral replacement (Right Knee)  Patient Location: PACU  Anesthesia Type:GA combined with regional for post-op pain  Level of Consciousness: drowsy and patient cooperative  Airway & Oxygen Therapy: Patient Spontanous Breathing and Patient connected to face mask oxygen  Post-op Assessment: Report given to RN and Post -op Vital signs reviewed and stable  Post vital signs: Reviewed and stable  Last Vitals:  Vitals Value Taken Time  BP 90/69 06/01/20 1050  Temp    Pulse 75 06/01/20 1051  Resp 9 06/01/20 1051  SpO2 96 % 06/01/20 1051  Vitals shown include unvalidated device data.  Last Pain:  Vitals:   06/01/20 0712  TempSrc: Oral  PainSc: 3       Patients Stated Pain Goal: 4 (17/47/15 9539)  Complications: No complications documented.

## 2020-06-01 NOTE — Anesthesia Preprocedure Evaluation (Addendum)
Anesthesia Evaluation  Patient identified by MRN, date of birth, ID band Patient awake    Reviewed: Allergy & Precautions, NPO status , Patient's Chart, lab work & pertinent test results  History of Anesthesia Complications (+) PONV and history of anesthetic complications  Airway Mallampati: II  TM Distance: >3 FB Neck ROM: Full    Dental no notable dental hx. (+) Teeth Intact, Dental Advisory Given   Pulmonary neg pulmonary ROS,    Pulmonary exam normal breath sounds clear to auscultation       Cardiovascular hypertension, Pt. on medications Normal cardiovascular exam Rhythm:Regular Rate:Normal     Neuro/Psych  Headaches, PSYCHIATRIC DISORDERS Anxiety Depression Bipolar Disorder    GI/Hepatic Neg liver ROS, GERD  Medicated and Controlled,Porphyrin metabolism issues  IBS   Endo/Other  negative endocrine ROS  Renal/GU negative Renal ROS   Interstitial cystitis     Musculoskeletal  (+) Arthritis , Osteoarthritis,  Fibromyalgia -dextroscoliosis with multilevel DDD and facet arthrosis most notable for R foraminal disc protrusion L4/5 producing severe R neural foraminal stenosis abutting R L4 nerve root, moderate spinal canal and mild lat recesss and R neural foraminal stenosis L3/4 (MRI)   Abdominal   Peds negative pediatric ROS (+)  Hematology negative hematology ROS (+) hct 38.3, plt 348   Anesthesia Other Findings   Reproductive/Obstetrics negative OB ROS                            Anesthesia Physical Anesthesia Plan  ASA: III  Anesthesia Plan: General and Regional   Post-op Pain Management: GA combined w/ Regional for post-op pain   Induction: Intravenous  PONV Risk Score and Plan: 4 or greater and Ondansetron, Dexamethasone, Midazolam, Treatment may vary due to age or medical condition, Scopolamine patch - Pre-op, Diphenhydramine, Propofol infusion and Metaclopromide  Airway  Management Planned: LMA  Additional Equipment: None  Intra-op Plan:   Post-operative Plan: Extubation in OR  Informed Consent: I have reviewed the patients History and Physical, chart, labs and discussed the procedure including the risks, benefits and alternatives for the proposed anesthesia with the patient or authorized representative who has indicated his/her understanding and acceptance.     Dental advisory given  Plan Discussed with: CRNA  Anesthesia Plan Comments:        Anesthesia Quick Evaluation

## 2020-06-01 NOTE — Progress Notes (Signed)
Orthopedic Tech Progress Note Patient Details:  Rachel Duran 07/06/1956 [id number]  CPM Right Knee CPM Right Knee: On Right Knee Flexion (Degrees): 10 Right Knee Extension (Degrees): 40  Post Interventions Patient Tolerated: Well Instructions Provided: Care of device     Post Interventions Patient Tolerated: Well Instructions Provided: Care of device   Janit Pagan 06/01/2020, 2:41 PM

## 2020-06-01 NOTE — Progress Notes (Signed)
Assisted Dr. Doroteo Glassman with right, ultrasound guided, adductor canal block. Side rails up, monitors on throughout procedure. See vital signs in flow sheet. Tolerated Procedure well.

## 2020-06-01 NOTE — Anesthesia Procedure Notes (Signed)
Anesthesia Regional Block: Adductor canal block   Pre-Anesthetic Checklist: ,, timeout performed, Correct Patient, Correct Site, Correct Laterality, Correct Procedure, Correct Position, site marked, Risks and benefits discussed,  Surgical consent,  Pre-op evaluation,  At surgeon's request and post-op pain management  Laterality: Right  Prep: Maximum Sterile Barrier Precautions used, chloraprep       Needles:  Injection technique: Single-shot  Needle Type: Echogenic Stimulator Needle     Needle Length: 9cm  Needle Gauge: 22     Additional Needles:   Procedures:,,,, ultrasound used (permanent image in chart),,,,  Narrative:  Start time: 06/01/2020 8:00 AM End time: 06/01/2020 8:05 AM Injection made incrementally with aspirations every 5 mL.  Performed by: Personally  Anesthesiologist: Pervis Hocking, DO  Additional Notes: Monitors applied. No increased pain on injection. No increased resistance to injection. Injection made in 5cc increments. Good needle visualization. Patient tolerated procedure well.

## 2020-06-02 ENCOUNTER — Encounter (HOSPITAL_BASED_OUTPATIENT_CLINIC_OR_DEPARTMENT_OTHER): Payer: Self-pay | Admitting: Orthopedic Surgery

## 2020-06-02 DIAGNOSIS — M222X1 Patellofemoral disorders, right knee: Secondary | ICD-10-CM | POA: Diagnosis not present

## 2020-06-02 LAB — URINE CULTURE: Culture: NO GROWTH

## 2020-06-02 MED ORDER — TIZANIDINE HCL 2 MG PO TABS
2.0000 mg | ORAL_TABLET | Freq: Three times a day (TID) | ORAL | 0 refills | Status: DC | PRN
Start: 2020-06-02 — End: 2020-06-12

## 2020-06-02 MED ORDER — ASPIRIN 81 MG PO CHEW
81.0000 mg | CHEWABLE_TABLET | Freq: Every day | ORAL | 0 refills | Status: DC
Start: 2020-06-02 — End: 2022-06-29

## 2020-06-02 MED ORDER — NAPROXEN 250 MG PO TABS
250.0000 mg | ORAL_TABLET | Freq: Two times a day (BID) | ORAL | 0 refills | Status: DC
Start: 2020-06-02 — End: 2020-07-10

## 2020-06-02 MED ORDER — OXYCODONE-ACETAMINOPHEN 5-325 MG PO TABS
1.0000 | ORAL_TABLET | Freq: Four times a day (QID) | ORAL | 0 refills | Status: DC | PRN
Start: 2020-06-02 — End: 2020-06-22

## 2020-06-02 NOTE — Progress Notes (Signed)
  Subjective: Rachel Duran is a 64 y.o. female s/p right knee patellofemoral replacement.  They are POD 1.  Pt's pain is controlled.   Pt has ambulated with some difficulty.   Did well with physical therapy.  She feels comfortable with discharge home this morning.  Objective: Vital signs in last 24 hours: Temp:  [97.7 F (36.5 C)-99.7 F (37.6 C)] 98.7 F (37.1 C) (11/09 0615) Pulse Rate:  [52-95] 80 (11/09 0615) Resp:  [8-22] 16 (11/09 0615) BP: (90-124)/(60-78) 121/76 (11/09 0615) SpO2:  [96 %-100 %] 98 % (11/09 0615) Weight:  [57.7 kg] 57.7 kg (11/08 0712)  Intake/Output from previous day: 11/08 0701 - 11/09 0700 In: 2540 [P.O.:1140; I.V.:1400] Out: 1200 [Urine:1200] Intake/Output this shift: No intake/output data recorded.  Exam:  No gross blood or drainage overlying the dressing 2+ DP pulse Sensation intact distally in the right foot Able to dorsiflex and plantarflex the right foot   Labs: No results for input(s): HGB in the last 72 hours. No results for input(s): WBC, RBC, HCT, PLT in the last 72 hours. No results for input(s): NA, K, CL, CO2, BUN, CREATININE, GLUCOSE, CALCIUM in the last 72 hours. No results for input(s): LABPT, INR in the last 72 hours.  Assessment/Plan: Pt is POD 1 s/p right knee patellofemoral replacement.    -Plan to discharge to home today   -WBAT with a walker  -Questions answered this morning to patient satisfaction.  Plan to follow-up with Dr. Marlou Sa in 2 weeks for clinical recheck with x-rays.    Rachel Duran Rachel Duran Rachel Duran 06/02/2020, 7:01 AM

## 2020-06-02 NOTE — Op Note (Signed)
NAME: Rachel Duran, Rachel Duran MEDICAL RECORD [id number] ACCOUNT [id number] DATE OF BIRTH:1956/02/14 FACILITY: [facility] LOCATION: [facility] PHYSICIAN:Foxx Klarich Randel Pigg, MD  OPERATIVE REPORT  DATE OF PROCEDURE:  06/01/2020  PREOPERATIVE DIAGNOSIS:  Right knee patellofemoral arthritis.  POSTOPERATIVE DIAGNOSIS:  Right knee patellofemoral arthritis.  PROCEDURE:  Right knee patellofemoral replacement using Arthrosurface size 10, trochlea replacement that was press-fit component, along with press-fit Stryker 32 mm offset 3-peg patella.  SURGEON:  [name], MD  ASSISTANT:  Annie Main, PA  INDICATIONS:  The patient is a 64 year old patient with end-stage patellofemoral arthritis who presents for operative management after explanation of risks and benefits.  PROCEDURE IN DETAIL:  The patient was brought to the operating room where general anesthetic was induced.  Preoperative antibiotics were administered.  Timeout was called.  Right leg was prescrubbed with alcohol and Betadine, allowed to air dry.  Prepped  with DuraPrep solution and draped in sterile manner.  Charlie Pitter was used to cover the operative field.  Right leg was elevated, exsanguinated with the Esmarch wrap.  Tourniquet was inflated.  Anterior approach to knee was made.  A median parapatellar  approach was made and marked with #1 Vicryl suture.  Patella was everted, fat pad partially removed.  Lateral patellofemoral ligament was released.  At this time, there was significant wear, grade IV chondromalacia, on both the medial and lateral facet  of the patella along with grade II-III chondromalacia within the trochlea.  The medial and lateral compartments were spared and had intact menisci and articular cartilage in the weightbearing region.  Following this, the drill guide was placed.  The  inferior foot was just above the notch.  The superior foot was in line with the sulcus.  Guide pin was placed.  Next, the trochlea was sized to a  size 4.  That was in the medial lateral plane.  On the anterior superior plane, it sized to a size 10.  That  size reaming block was then placed after doing the initial reaming.  The medial lateral wall reaming was performed just below the black line on the reamer.  That was just below the 4 level marked in terms of initial reaming.  Next, the reaming block was  placed.  Correct alignment was double checked from the front.  There was no rocking with that reaming block and it was pinned in place.  Circular scalpel was used to score the cartilage.  Then, the reamers were taken through the bushing, both the outer  reamer and the edge reamer.  Trial was placed, and it sat very nicely and slightly recessed within the prepared space.  With the trial in place, the post was then prepared by staying concentric within the hub.  The drill and tapping remained on axis.   After preparation, the pilot drill was performed.  The screw was placed because of the excellent placement of the trial.  Excellent bone quality was achieved and press-fit was maintained.  Next, after placing the post and screwing it into position,  attention was directed towards the patella.  The patella was then cut down from a 20 down to an 11 mm patella.  Trial button was placed.  At this time, the true implant was placed.  That was placed on the trochlea.  Excellent press-fit and slight recess  and flush component was present circumferentially.  Next, with the trial patella in place, patella had excellent tracking and sat well within the sulcus.  Trial patellar component removed.  Thorough irrigation  was performed after the incision with the  IrriSept as well as multiple times.  True patellar component was placed with same stability parameters maintained.  Patella height was restored to between 20 and 21 mm.  Next, the capsule was anesthetized with Marcaine and saline.  Tranexamic acid sponge  was placed within the incision for 3 minutes.  Thorough  irrigation was then performed, and the tourniquet was released.  Bleeding points encountered controlled using cautery.  Vancomycin powder was then placed over the implant, and the knee was closed  over a bolster using #1 Vicryl suture followed by interrupted inverted 0 Vicryl suture, more vancomycin powder and IrriSept and then 2-0 Vicryl and 3-0 Monocryl.  Steri-Strips applied.  Aquacel dressing applied.  It should be noted that solution of  Marcaine, morphine, clonidine was injected into the knee for postop pain relief.  That was done after the arthrotomy closure.  The patient was then placed in a knee immobilizer.  She tolerated the procedure well without immediate complication.  Luke's  assistance was required for opening, closing, retraction, and mobilization of tissue.  His assistance was medical necessity.  IN/NUANCE  D:06/01/2020 T:06/01/2020 JOB:[id number]

## 2020-06-02 NOTE — Discharge Instructions (Signed)
Information for Discharge Teaching: °EXPAREL (bupivacaine liposome injectable suspension)  ° °Your surgeon or anesthesiologist gave you EXPAREL(bupivacaine) to help control your pain after surgery.  °EXPAREL is a local anesthetic that provides pain relief by numbing the tissue around the surgical site. °EXPAREL is designed to release pain medication over time and can control pain for up to 72 hours. °Depending on how you respond to EXPAREL, you may require less pain medication during your recovery. ° °Possible side effects: °Temporary loss of sensation or ability to move in the area where bupivacaine was injected. °Nausea, vomiting, constipation °Rarely, numbness and tingling in your mouth or lips, lightheadedness, or anxiety may occur. °Call your doctor right away if you think you may be experiencing any of these sensations, or if you have other questions regarding possible side effects. ° °Follow all other discharge instructions given to you by your surgeon or nurse. Eat a healthy diet and drink plenty of water or other fluids. ° °If you return to the hospital for any reason within 96 hours following the administration of EXPAREL, it is important for health care providers to know that you have received this anesthetic. A teal colored band has been placed on your arm with the date, time and amount of EXPAREL you have received in order to alert and inform your health care providers. Please leave this armband in place for the full 96 hours following administration, and then you may remove the band.  °

## 2020-06-04 ENCOUNTER — Telehealth: Payer: Self-pay

## 2020-06-04 NOTE — Telephone Encounter (Signed)
Received faxed notice from CoverMyMeds stating PA for Movantik 25 mg tab will be expiring.  Submitted PA renewal; key:  [id number].  Decision pending.

## 2020-06-05 ENCOUNTER — Other Ambulatory Visit: Payer: Self-pay | Admitting: Family Medicine

## 2020-06-06 NOTE — Telephone Encounter (Signed)
Last office visit 05/05/2020 for chronic pain management.  Last refilled 03/03/2020 for #90 with no refills. UDS/Contract 04/19/2019.

## 2020-06-08 NOTE — Telephone Encounter (Signed)
ERx 

## 2020-06-10 ENCOUNTER — Ambulatory Visit (INDEPENDENT_AMBULATORY_CARE_PROVIDER_SITE_OTHER): Payer: Medicare Other

## 2020-06-10 ENCOUNTER — Ambulatory Visit (INDEPENDENT_AMBULATORY_CARE_PROVIDER_SITE_OTHER): Payer: Medicare Other | Admitting: Orthopedic Surgery

## 2020-06-10 DIAGNOSIS — M1711 Unilateral primary osteoarthritis, right knee: Secondary | ICD-10-CM | POA: Diagnosis not present

## 2020-06-12 ENCOUNTER — Other Ambulatory Visit: Payer: Self-pay | Admitting: Surgical

## 2020-06-12 ENCOUNTER — Encounter: Payer: Self-pay | Admitting: Orthopedic Surgery

## 2020-06-12 MED ORDER — TIZANIDINE HCL 2 MG PO TABS
2.0000 mg | ORAL_TABLET | Freq: Three times a day (TID) | ORAL | 0 refills | Status: DC | PRN
Start: 2020-06-12 — End: 2020-07-20

## 2020-06-12 MED ORDER — AMOXICILLIN 500 MG PO TABS: ORAL_TABLET | ORAL | 0 refills | Status: DC

## 2020-06-12 NOTE — Telephone Encounter (Signed)
Please advise. Thanks.  

## 2020-06-12 NOTE — Telephone Encounter (Signed)
Sent in and called her

## 2020-06-14 ENCOUNTER — Encounter: Payer: Self-pay | Admitting: Orthopedic Surgery

## 2020-06-14 NOTE — Progress Notes (Signed)
Post-Op Visit Note   Patient: Rachel Duran           Date of Birth: [birth year]           MRN: [id number] Visit Date: 06/10/2020 PCP: Ria Bush, MD   Assessment & Plan:  Chief Complaint:  Chief Complaint  Patient presents with  . Right Knee - Pain   Visit Diagnoses:  1. Unilateral primary osteoarthritis, right knee     Plan: Patient is a 64 year old female presents s/p right knee patellofemoral replacement 06/01/2020. 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 She has been doing well with home health physical therapy. Denies any fevers, chills, night sweats. She started physical therapy outpatient in early December. She takes oxycodone on only 1 time per day to deal with pain. She has remained compliant with taking aspirin for DVT prophylaxis. On exam she has 5 to 10 degrees extension and greater than 90 degrees of flexion. No calf tenderness on exam. Negative Homans' sign. Patient was encouraged to continue to work on extension and this was demonstrated. Follow-up in 4 weeks for clinical recheck. Radiographs show  Follow-Up Instructions: No follow-ups on file.   Orders:  Orders Placed This Encounter  Procedures  . XR Knee 1-2 Views Right   No orders of the defined types were placed in this encounter.   Imaging: No results found.  PMFS History: Patient Active Problem List   Diagnosis Date Noted  . S/P knee surgery 06/01/2020  . Pre-op evaluation 05/27/2020  . Pulmonary nodule 05/27/2020  . 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 Chronic patellofemoral pain of right knee 05/05/2020  . Positive ANA (antinuclear antibody) 06/05/2019  . 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  . Unilateral primary osteoarthritis, left hip 01/28/2019  . Pelvic pain 12/12/2018  . Common bile duct (CBD) obstruction   . Elevated alkaline phosphatase level   . Venous insufficiency of left lower extremity 07/04/2018  . Dyslipidemia  09/22/2017  . DDD (degenerative disc disease), cervical 04/03/2017  . DDD (degenerative disc disease), lumbar 04/03/2017  . Onychomycosis 03/21/2017  . Dysphagia 10/18/2016  . Encounter for chronic pain management 10/18/2016  . Biliary stasis 09/26/2016  . Fever blister 08/18/2016  . Urinary urgency 07/05/2016  . HNP (herniated nucleus pulposus), lumbar 09/23/2015  . Sinus congestion 07/30/2015  . Iron deficiency 07/30/2015  . Health maintenance examination 06/15/2015  . 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 Headache 03/09/2015  . Clavicle enlargement 12/13/2014  . Prediabetes 12/13/2014  . Plantar fasciitis, bilateral 09/11/2014  . Advanced care planning/counseling discussion 06/10/2014  . Medicare annual wellness visit, subsequent 06/10/2014  . Abnormal thyroid function test 06/10/2014  . Chronic pain syndrome 06/10/2014  . CFS (chronic fatigue syndrome) 04/08/2014  . Postmenopausal atrophic vaginitis 10/19/2012  . 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 Cervical disc disorder with radiculopathy of cervical region 05/28/2010  . CHEST PAIN UNSPECIFIED 02/28/2008  . APHTHOUS ULCERS 01/31/2008  . Chronic insomnia 11/09/2007  . Drug-induced constipation 10/11/2007  . Allergic rhinitis 04/12/2007  . MDD (major depressive disorder), recurrent episode, moderate ([place]) 03/05/2007  . [name] syndrome 03/05/2007  . GERD 03/05/2007  . ROSACEA 03/05/2007  . NEURALGIA 03/05/2007  . 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 Chronic interstitial cystitis 12/06/2006  . Fibromyalgia 12/06/2006   Past Medical History:  Diagnosis Date  . Abdominal pain last 4 months   and nausea also  . Allergy   . Anemia  history of  . Anxiety   . Bipolar disorder ([place])    atpical bipolar disorder  . 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 C6- C7 -hx. past fusion(bone graft used)  . Cholecystitis   . Chronic pain   . DDD (degenerative disc disease), lumbar  09/2015   dextroscoliosis with multilevel DDD and facet arthrosis most notable for R foraminal disc protrusion L4/5 producing severe R neural foraminal stenosis abutting R L4 nerve root, moderate spinal canal and mild lat recesss and R neural foraminal stenosis L3/4 (MRI)  . Depression    bipolar depression  . Disorders of porphyrin metabolism   . Felon of finger of left hand 11/10/2016  . Fibromyalgia   . GERD (gastroesophageal reflux disease)   . Headache    occasionally  . Hypertension   . Internal hemorrhoids   . Interstitial cystitis 06-06-12   hx.  . Irritable bowel syndrome   . PONV (postoperative nausea and vomiting)    now uses stomach blockers and no ponv  . 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 Raynauds disease    hx.  . Seronegative arthritis    [name]  . 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 CAD Father 69       MI, nonsmoker  . Esophageal cancer Father 36  . Stomach cancer Father   . Scleroderma Mother   . Hypertension Mother   . Esophageal cancer Paternal Grandfather   . Stomach cancer Paternal Grandfather   . Diabetes Maternal Grandmother   . Arthritis Brother   . Stroke Neg Hx   . Colon cancer Neg Hx   . Rectal cancer Neg Hx     Past Surgical History:  Procedure Laterality Date  . ANTERIOR CERVICAL DECOMP/DISCECTOMY FUSION  2004   C5/6, C6/7  . ANTERIOR CERVICAL DECOMP/DISCECTOMY FUSION  02/2016   C3/4, C4/5 with plating [name])  . AUGMENTATION MAMMAPLASTY Bilateral 03/25/2010  . BREAST ENHANCEMENT SURGERY  2010  . BREAST IMPLANT EXCHANGE  10/2014   exchange saline implants, B mastopexy/capsulorraphy ([facility])  . BUNIONECTOMY Bilateral yrs ago  . [place]   x 1  . CHOLECYSTECTOMY   06/11/2012   Procedure: LAPAROSCOPIC CHOLECYSTECTOMY WITH INTRAOPERATIVE CHOLANGIOGRAM;  Surgeon: [name], MD,FACS;  Location: [facility];  Service: General;  Laterality: N/A;  . COLONOSCOPY  02/2018   done for positive cologuard - WNL, rpt 10 yrs Fuller Plan)  . CYSTOSCOPY    . ERCP  05/22/2012   Procedure: ENDOSCOPIC RETROGRADE CHOLANGIOPANCREATOGRAPHY (ERCP);  Surgeon: [name], MD,FACG;  Location: [facility];  Service: Endoscopy;  Laterality: N/A;  . ERCP N/A 09/17/2013   Procedure: ENDOSCOPIC RETROGRADE CHOLANGIOPANCREATOGRAPHY (ERCP);  Surgeon: [name], MD;  Location: [facility];  Service: Endoscopy;  Laterality: N/A;  . ERCP N/A 09/27/2018   Procedure: ENDOSCOPIC RETROGRADE CHOLANGIOPANCREATOGRAPHY (ERCP);  Surgeon: [name], MD;  Location: [facility];  Service: Endoscopy;  Laterality: N/A;  . ESOPHAGOGASTRODUODENOSCOPY  09/2016   WNL. esophagus dilated Fuller Plan)  . HEMORRHOID BANDING  09-23-13   --Dr. Greer Pickerel  . HERNIA REPAIR     inguinal  . HYSTEROSCOPY W/ ENDOMETRIAL ABLATION    . NASAL SINUS SURGERY  x5  . PARTIAL KNEE ARTHROPLASTY Right 06/01/2020   Procedure: Right knee patellofemoral replacement;  Surgeon: [name], MD;  Location: [place];  Service: Orthopedics;  Laterality: Right;  . REMOVAL OF STONES  09/27/2018   Procedure: REMOVAL OF STONES;  Surgeon: [name], MD;  Location: [facility];  Service: Endoscopy;;  . [name]  09/27/2018   Procedure: SPHINCTEROTOMY;  Surgeon: [name], MD;  Location: [facility];  Service: Endoscopy;;  . TOTAL HIP ARTHROPLASTY Left 03/12/2019   Procedure: LEFT TOTAL HIP ARTHROPLASTY ANTERIOR APPROACH;  [name], [name], MD)  . UPPER GASTROINTESTINAL ENDOSCOPY     Social History   Occupational History  . Occupation: retired    Fish farm manager: UNEMPLOYED  Tobacco Use  . Smoking status: Never Smoker  . Smokeless tobacco: Never Used  Vaping Use  . Vaping Use:  Never used  Substance and Sexual Activity  . Alcohol use: No    Alcohol/week: 0.0 standard drinks  . Drug use: No  . Sexual activity: Yes    Partners: Male    Birth control/protection: Post-menopausal    Comment: vasectomy

## 2020-06-18 NOTE — Discharge Summary (Signed)
Physician Discharge Summary      Patient ID: Rachel Duran MRN: [id number] DOB/AGE: [birth year] 64 y.o.  Admit date: 06/01/2020 Discharge date: 06/02/2020  Admission Diagnoses:  Active Problems:   S/P knee surgery   Discharge Diagnoses:  Same  Surgeries: Procedure(s): Right knee patellofemoral replacement on 06/01/2020   Consultants:   Discharged Condition: Stable  Hospital Course: Rachel Duran is an 64 y.o. female who was admitted 06/01/2020 with a chief complaint of right knee pain, and found to have a diagnosis of right knee patellofemoral OA.  They were brought to the operating room on 06/01/2020 and underwent the above named procedures.  Pt awoke from anesthesia without complication and was transferred to the floor. On POD1, patient's pain was controlled and she mobilized well with PT.  Pt will f/u with Dr. Marlou Sa in clinic in ~2 weeks.   Antibiotics given:  Anti-infectives (From admission, onward)   Start     Dose/Rate Route Frequency Ordered Stop   06/01/20 1400  ceFAZolin (ANCEF) IVPB 1 g/50 mL premix        1 g 100 mL/hr over 30 Minutes Intravenous Every 8 hours 06/01/20 1141 06/02/20 0559   06/01/20 1002  vancomycin (VANCOCIN) powder  Status:  Discontinued          As needed 06/01/20 1002 06/01/20 1048   06/01/20 0645  ceFAZolin (ANCEF) IVPB 2g/100 mL premix        2 g 200 mL/hr over 30 Minutes Intravenous On call to O.R. 06/01/20 2536 06/01/20 0847    .  Recent vital signs:  Vitals:   06/02/20 0515 06/02/20 0615  BP:  121/76  Pulse:  80  Resp:  16  Temp:  98.7 F (37.1 C)  SpO2: 98% 98%    Recent laboratory studies:  Results for orders placed or performed during the hospital encounter of 06/01/20  Surgical pcr screen   Specimen: Nasal Mucosa; Nasal Swab  Result Value Ref Range   MRSA, PCR NEGATIVE NEGATIVE   Staphylococcus aureus NEGATIVE NEGATIVE  Urine culture   Specimen: Urine, Clean Catch  Result Value Ref Range   Specimen  Description URINE, CLEAN CATCH    Special Requests NONE    Culture      NO GROWTH Performed at [facility], [place] [address] [place], [place] [address]    Report Status 06/02/2020 FINAL   Urinalysis, Routine w reflex microscopic Urine, Clean Catch  Result Value Ref Range   Color, Urine YELLOW YELLOW   APPearance HAZY (A) CLEAR   Specific Gravity, Urine 1.021 1.005 - 1.030   pH 5.0 5.0 - 8.0   Glucose, UA NEGATIVE NEGATIVE mg/dL   Hgb urine dipstick NEGATIVE NEGATIVE   Bilirubin Urine NEGATIVE NEGATIVE   Ketones, ur NEGATIVE NEGATIVE mg/dL   Protein, ur NEGATIVE NEGATIVE mg/dL   Nitrite NEGATIVE NEGATIVE   Leukocytes,Ua SMALL (A) NEGATIVE   RBC / HPF 0-5 0 - 5 RBC/hpf   WBC, UA 6-10 0 - 5 WBC/hpf   Bacteria, UA NONE SEEN NONE SEEN   Squamous Epithelial / LPF 0-5 0 - 5   Mucus PRESENT    Hyaline Casts, UA PRESENT    *Note: Due to a large number of results and/or encounters for the requested time period, some results have not been displayed. A complete set of results can be found in Results Review.    Discharge Medications:   Allergies as of 06/02/2020      Reactions   Inh [isoniazid]  Other (See Comments)   Hepatitis   Cymbalta [duloxetine Hcl] Other (See Comments)   Urinary retention   Nucynta [tapentadol] Other (See Comments)   Agitation, jerking in legs   Silenor [doxepin Hcl] Other (See Comments)   nightmare, dizziness, stumbling upon walking, not effective   Benzoin Dermatitis   Fentanyl Other (See Comments)   Cold intolerance, arms twitching, insomnia, fatigue   Celebrex [celecoxib] Other (See Comments)   Headaches   Codeine Phosphate Nausea And Vomiting, Other (See Comments)   Severe n&v   Lithium Other (See Comments)   Sores in mouth   Meperidine Hcl Rash, Other (See Comments)   REACTION: red skin   Sulfamethoxazole Rash      Medication List    TAKE these medications   ALPRAZolam 1 MG tablet Commonly known as: XANAX Take 0.5 mg by mouth at  bedtime.   amLODipine 5 MG tablet Commonly known as: NORVASC Take 1.5 tablets (7.5 mg total) by mouth daily.   ARIPiprazole 5 MG tablet Commonly known as: ABILIFY Take 5 mg by mouth at bedtime.   aspirin 81 MG chewable tablet Chew 1 tablet (81 mg total) by mouth daily.   calcium-vitamin D 500-200 MG-UNIT tablet Commonly known as: OSCAL WITH D Take 1 tablet by mouth 2 (two) times daily.   cyanocobalamin 1000 MCG/ML injection Commonly known as: (VITAMIN B-12) INJECT 1ML INTO THE MUSCLE EVERY 21 DAYSAS DIRECTED   diclofenac sodium 1 % Gel Commonly known as: VOLTAREN Apply 2-4 grams to affected joint up to 4 times daily.   Eszopiclone 3 MG Tabs TAKE ONE TABLET AT BEDTIME   ferrous sulfate 325 (65 FE) MG tablet Take 325 mg by mouth every Monday, Wednesday, and Friday.   fluticasone 50 MCG/ACT nasal spray Commonly known as: FLONASE Place 1 spray into both nostrils daily as needed for allergies.   Intrarosa 6.5 MG Inst Generic drug: Prasterone Place 1 suppository vaginally at bedtime.   lamoTRIgine 100 MG tablet Commonly known as: LAMICTAL Take 100 mg by mouth daily.   Linzess 290 MCG Caps capsule Generic drug: linaclotide TAKE 1 CAPSULE BY MOUTH DAILY   MiraLax 17 g packet Generic drug: polyethylene glycol Take 17 g by mouth 3 (three) times daily.   morphine 15 MG tablet Commonly known as: MSIR TAKE 1 TABLET BY MOUTH THREE TIMES DAILYAS NEEDED FOR MODERATE OR SEVERE PAIN   naloxegol oxalate 25 MG Tabs tablet Commonly known as: Movantik Take 1 tablet (25 mg total) by mouth daily.   naproxen 250 MG tablet Commonly known as: NAPROSYN Take 1 tablet (250 mg total) by mouth 2 (two) times daily with a meal.   NONFORMULARY OR COMPOUNDED ITEM Testosterone propionate 2% in white petrolatum, apply small amount once daily for 5 days per week.   ondansetron 4 MG disintegrating tablet Commonly known as: ZOFRAN-ODT TAKE 1 TABLET BY MOUTH EVERY 8 HOURS AS NEEDED FOR  NAUSEA OR VOMITING   ondansetron 4 MG tablet Commonly known as: ZOFRAN TAKE ONE TABLET EVERY EIGHT HOURS AS NEEDED FOR NAUSEA / VOMITING   oxyCODONE-acetaminophen 5-325 MG tablet Commonly known as: Percocet Take 1 tablet by mouth every 6 (six) hours as needed for severe pain (breakthrough pain that is not controlled with morphine).   pantoprazole 40 MG tablet Commonly known as: PROTONIX TAKE 1 TABLET BY MOUTH TWICE DAILY WITH A MEAL   pentosan polysulfate 100 MG capsule Commonly known as: ELMIRON Take 200 mg by mouth 2 (two) times daily.   phenazopyridine 100 MG  tablet Commonly known as: PYRIDIUM Take by mouth as needed.   pregabalin 150 MG capsule Commonly known as: LYRICA TAKE 1 CAPSULE BY MOUTH TWICE DAILY   senna 8.6 MG tablet Commonly known as: SENOKOT Take 2 tablets by mouth daily as needed.   SF 5000 Plus 1.1 % Crea dental cream Generic drug: sodium fluoride Place 1 application onto teeth at bedtime.   PreviDent 5000 Booster Plus 1.1 % Pste Generic drug: Sodium Fluoride See admin instructions.   ursodiol 300 MG capsule Commonly known as: ACTIGALL Take 1 capsule (300 mg total) by mouth 2 (two) times daily.       Diagnostic Studies: XR Knee 1-2 Views Right  Result Date: 06/11/2020 AP lateral right knee reviewed.  Patellofemoral replacement in good position alignment.  No acute bony complications.   Disposition: Discharge disposition: 01-Home or Self Care       Discharge Instructions    Call MD / Call 911   Complete by: As directed    If you experience chest pain or shortness of breath, CALL 911 and be transported to the hospital emergency room.  If you develope a fever above 101 F, pus (white drainage) or increased drainage or redness at the wound, or calf pain, call your surgeon's office.   Constipation Prevention   Complete by: As directed    Drink plenty of fluids.  Prune juice may be helpful.  You may use a stool softener, such as Colace (over  the counter) 100 mg twice a day.  Use MiraLax (over the counter) for constipation as needed.   Diet - low sodium heart healthy   Complete by: As directed    Discharge instructions   Complete by: As directed    You may shower, dressing is waterproof.  Do not remove the dressing, we will remove it at your first post-op appointment.  Do not take a bath or soak the knee in a tub or pool.  You may weightbear as you can tolerate on the operative leg with a walker.  Continue using the CPM machine 3 times per day for at least one hour each time, increasing the degrees of range of motion daily.  Goal for range of motion is 90 degrees of flexion at 2 weeks.  Use the blue cradle boot under your heel to work on getting your leg straight.  Try and do 3 sets of 10 reps of straight leg raises every day.  Once he can easily perform 10-15 straight leg raises in a row, you may discontinue the knee immobilizer.  Do NOT put a pillow under your knee.  You will follow-up with Dr. Marlou Sa in the clinic in 2 weeks at your given appointment date.  Call the office at [phone] with any questions or concerns.  Make sure to take aspirin every day to prevent any blood clots from forming.  Dental Antibiotics:  In most cases prophylactic antibiotics for Dental procdeures after total joint surgery are not necessary.  Exceptions are as follows:  1. History of prior total joint infection  2. Severely immunocompromised (Organ Transplant, cancer chemotherapy, Rheumatoid biologic meds such as Levering)  3. Poorly controlled diabetes (A1C &gt; 8.0, blood glucose over 200)  If you have one of these conditions, contact your surgeon for an antibiotic prescription, prior to your dental procedure.   Increase activity slowly as tolerated   Complete by: As directed          Signed: Donella Stade 06/18/2020, 11:43 AM

## 2020-06-20 NOTE — Progress Notes (Signed)
[facility] CONSULT NOTE  Referring Physician:  Primary Care: Ria Bush, MD    HPI:  Rachel Duran is a 64 y/o woman with bipolar d/o, HTN, fobromyalgia, systemic sclerosis referred by Dr. Estanislado Pandy for pulmonary HTN surveillance in setting of CTD.   Has a complicated medical history but denies any h/o known cardiac disease.   Had echo in 7/14 for LE edema which was normal. Had hi-res CT with Dr.Mannam on 05/08/20. No ILD. Evidence of air trapping suggestive of small airway disease. 55mm pulmonary nodule.   Underwent right knee patellofemoral replacement on 06/01/2020  Prior to knee was quite active without too much trouble. Over past year has has some mild exertional dyspnea with hills or steps.    Review of Systems: [y] = yes, [ ]  = no   General: Weight gain [ ] ; Weight loss [ ] ; Anorexia [ ] ; Fatigue Blue.Reese ]; Fever [ ] ; Chills [ ] ; Weakness Blue.Reese ]  Cardiac: Chest pain/pressure [ ] ; Resting SOB [ ] ; Exertional SOB [ ] ; Orthopnea [ ] ; Pedal Edema [ ] ; Palpitations [ ] ; Syncope [ ] ; Presyncope [ ] ; Paroxysmal nocturnal dyspnea[ ]   Pulmonary: Cough [ ] ; Wheezing[ ] ; Hemoptysis[ ] ; Sputum [ ] ; Snoring [ ]   GI: Vomiting[ ] ; Dysphagia[ ] ; Melena[ ] ; Hematochezia [ ] ; Heartburn[ ] ; Abdominal pain [ y]; Constipation [ ] ; Diarrhea [ ] ; BRBPR [ ]   GU: Hematuria[ ] ; Dysuria [ ] ; Nocturia[ ]   Vascular: Pain in legs with walking [ y]; Pain in feet with lying flat [ ] ; Non-healing sores [ ] ; Stroke [ ] ; TIA [ ] ; Slurred speech [ ] ;  Neuro: Headaches[ ] ; Vertigo[ ] ; Seizures[ ] ; Paresthesias[ ] ;Blurred vision [ ] ; Diplopia [ ] ; Vision changes [ ]   Ortho/Skin: Arthritis [ y]; Joint pain [ y]; Muscle pain Blue.Reese ]; Joint swelling [ y]; Back Pain Blue.Reese ]; Rash [ ]   Psych: Depression[ y]; Anxiety[ y]  Heme: Bleeding problems [ ] ; Clotting disorders [ ] ; Anemia [ ]   Endocrine: Diabetes [ ] ; Thyroid dysfunction[ ]    Past Medical History:  Diagnosis Date  . Abdominal pain last 4 months   and nausea  also  . Allergy   . Anemia    history of  . Anxiety   . Bipolar disorder ([place])    atpical bipolar disorder  . 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 C6- C7 -hx. past fusion(bone graft used)  . Cholecystitis   . Chronic pain   . DDD (degenerative disc disease), lumbar 09/2015   dextroscoliosis with multilevel DDD and facet arthrosis most notable for R foraminal disc protrusion L4/5 producing severe R neural foraminal stenosis abutting R L4 nerve root, moderate spinal canal and mild lat recesss and R neural foraminal stenosis L3/4 (MRI)  . Depression    bipolar depression  . Disorders of porphyrin metabolism   . Felon of finger of left hand 11/10/2016  . Fibromyalgia   . GERD (gastroesophageal reflux disease)   . Headache    occasionally  . Hypertension   . Internal hemorrhoids   . Interstitial cystitis 06-06-12   hx.  . Irritable bowel syndrome   . PONV (postoperative nausea and vomiting)    now uses stomach blockers and no ponv  . 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  Raynauds disease    hx.  . Seronegative arthritis    [name]  . 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 ALPRAZolam (XANAX) 1 MG tablet Take 0.5 mg by mouth at bedtime.   0  . amLODipine (NORVASC) 5 MG tablet Take 1.5 tablets (7.5 mg total) by mouth daily. 45 tablet 2  . amoxicillin (AMOXIL) 500 MG tablet Take 4 tablets (2,000mg ) by mouth 30-60 minutes prior to dental cleaning. 4 tablet 0  . amphetamine-dextroamphetamine (ADDERALL XR) 30 MG 24 hr capsule TAKE 1 CAPSULE BY MOUTH EVERY MORNING 90 capsule 0  . ARIPiprazole (ABILIFY) 5 MG tablet Take 5 mg by mouth daily.    Marland Kitchen aspirin 81 MG chewable tablet Chew 1 tablet (81 mg total) by mouth  daily. 30 tablet 0  . calcium-vitamin D (OSCAL WITH D) 500-200 MG-UNIT tablet Take 1 tablet by mouth 2 (two) times daily.    . cyanocobalamin (,VITAMIN B-12,) 1000 MCG/ML injection INJECT 1ML INTO THE MUSCLE EVERY 21 DAYSAS DIRECTED 3 mL 3  . diclofenac sodium (VOLTAREN) 1 % GEL Apply 2-4 grams to affected joint up to 4 times daily. 400 g 4  . Eszopiclone 3 MG TABS TAKE ONE TABLET AT BEDTIME 90 tablet 0  . ferrous sulfate 325 (65 FE) MG tablet Take 325 mg by mouth every Monday, Wednesday, and Friday.     . fluticasone (FLONASE) 50 MCG/ACT nasal spray Place 1 spray into both nostrils daily as needed for allergies. 16 g 5  . lamoTRIgine (LAMICTAL) 100 MG tablet Take 100 mg by mouth daily.    Marland Kitchen LINZESS 290 MCG CAPS capsule TAKE 1 CAPSULE BY MOUTH DAILY 90 capsule 1  . morphine (MSIR) 15 MG tablet Take 15 mg by mouth 2 (two) times daily.    . naloxegol oxalate (MOVANTIK) 25 MG TABS tablet Take 1 tablet (25 mg total) by mouth daily. 90 tablet 1  . naproxen (NAPROSYN) 250 MG tablet Take 1 tablet (250 mg total) by mouth 2 (two) times daily with a meal. 30 tablet 0  . NONFORMULARY OR COMPOUNDED ITEM Testosterone propionate 2% in white petrolatum, apply small amount once daily for 5 days per week. 60 each 0  . ondansetron (ZOFRAN) 4 MG tablet TAKE ONE TABLET EVERY EIGHT HOURS AS NEEDED FOR NAUSEA / VOMITING 30 tablet 1  . ondansetron (ZOFRAN-ODT) 4 MG disintegrating tablet TAKE 1 TABLET BY MOUTH EVERY 8 HOURS AS NEEDED FOR NAUSEA OR VOMITING 20 tablet 1  . pantoprazole (PROTONIX) 40 MG tablet TAKE 1 TABLET BY MOUTH TWICE DAILY WITH A MEAL 180 tablet 1  . pentosan polysulfate (ELMIRON) 100 MG capsule Take 200 mg by mouth 2 (two) times daily.     . phenazopyridine (PYRIDIUM) 100 MG tablet Take by mouth as needed.     . polyethylene glycol (MIRALAX) 17 g packet Take 17 g by mouth 3 (three) times daily.     . Prasterone (INTRAROSA) 6.5 MG INST Place 1 suppository vaginally at bedtime. 84 each 3  . pregabalin  (LYRICA) 150 MG capsule TAKE 1 CAPSULE BY MOUTH TWICE DAILY 180 capsule 0  . PREVIDENT 5000 BOOSTER PLUS 1.1 % PSTE See admin instructions.    . senna (SENOKOT) 8.6 MG tablet Take 2 tablets by mouth daily as needed.     . SF 5000 PLUS 1.1 % CREA dental cream Place 1 application onto teeth at bedtime.     Marland Kitchen  tiZANidine (ZANAFLEX) 2 MG tablet Take 1 tablet (2 mg total) by mouth every 8 (eight) hours as needed. 30 tablet 0  . ursodiol (ACTIGALL) 300 MG capsule Take 1 capsule (300 mg total) by mouth 2 (two) times daily. 180 capsule 3   No current facility-administered medications for this encounter.    Allergies  Allergen Reactions  . Inh [Isoniazid] Other (See Comments)    Hepatitis  . Cymbalta [Duloxetine Hcl] Other (See Comments)    Urinary retention  . Nucynta [Tapentadol] Other (See Comments)    Agitation, jerking in legs  . Silenor [Doxepin Hcl] Other (See Comments)    nightmare, dizziness, stumbling upon walking, not effective  . Benzoin Dermatitis  . Fentanyl Other (See Comments)    Cold intolerance, arms twitching, insomnia, fatigue  . Celebrex [Celecoxib] Other (See Comments)    Headaches  . Codeine Phosphate Nausea And Vomiting and Other (See Comments)    Severe n&v  . Lithium Other (See Comments)    Sores in mouth   . Meperidine Hcl Rash and Other (See Comments)    REACTION: red skin  . Sulfamethoxazole Rash      Social History   Socioeconomic History  . Marital status: Married    Spouse name: Not on file  . Number of children: Not on file  . Years of education: Not on file  . Highest education level: Not on file  Occupational History  . Occupation: retired    Fish farm manager: UNEMPLOYED  Tobacco Use  . Smoking status: Never Smoker  . Smokeless tobacco: Never Used  Vaping Use  . Vaping Use: Never used  Substance and Sexual Activity  . Alcohol use: No    Alcohol/week: 0.0 standard drinks  . Drug use: No  . Sexual activity: Yes    Partners: Male    Birth  control/protection: Post-menopausal    Comment: vasectomy  Other Topics Concern  . 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 Difficulty of Paying Living Expenses: Not hard at all  Food Insecurity: No Food Insecurity  . Worried About Charity fundraiser in the Last Year: Never true  . Ran Out of Food in the Last Year: Never true  Transportation Needs: No Transportation Needs  . Lack of Transportation (Medical): No  . Lack of Transportation (Non-Medical): No  Physical Activity: Inactive  . Days of Exercise per Week: 0 days  . Minutes of Exercise per Session: 0 min  Stress: Stress Concern Present  . Feeling of Stress : Rather much  Social Connections:   . Frequency of Communication with Friends and Family: Not on file  . Frequency of Social Gatherings with Friends and Family: Not on file  . Attends Religious Services: Not on file  . Active Member of Clubs or Organizations: Not on file  . Attends Archivist Meetings: Not on file  . Marital Status: Not on file  Intimate Partner Violence: Not At Risk  . Fear of Current or Ex-Partner: No  . Emotionally Abused: No  . Physically Abused: No  . Sexually  Abused: No      Family History  Problem Relation Age of Onset  . CAD Father 70       MI, nonsmoker  . Esophageal cancer Father 8  . Stomach cancer Father   . Scleroderma Mother   . Hypertension Mother   . Esophageal cancer Paternal Grandfather   . Stomach cancer Paternal Grandfather   . Diabetes Maternal Grandmother   . Arthritis Brother   . Stroke Neg Hx   . Colon cancer Neg Hx   . Rectal cancer Neg Hx       Vitals:   06/22/20 1159  BP: 138/80  Pulse: (!) 111  SpO2: 98%  Weight: 57.8 kg (127 lb 6.4 oz)    PHYSICAL EXAM: General:  Well appearing. No respiratory difficulty HEENT: normal + telengectascias Neck: supple. no JVD. Carotids 2+ bilat; no bruits. No lymphadenopathy or thryomegaly appreciated. Cor: PMI nondisplaced. Regular tachy No rubs, gallops or murmurs. Lungs: clear Abdomen: soft, nontender, nondistended. No hepatosplenomegaly. No bruits or masses. Good bowel sounds. Extremities: no cyanosis, clubbing, rash, edema + joint thickening No scleradactyly Neuro: alert & oriented x 3, cranial nerves grossly intact. moves all 4 extremities w/o difficulty. Affect pleasant.  ECG: 05/27/20 NSR 98. Normal axis and intervals Personally reviewed   ASSESSMENT & PLAN:  1. Systemic sclerosis - hi-res chest CT 11/21 - no evidence of ILD - PFTs pending 07/07/20 - echo today 06/22/20 EF 55-60% RV normal no evidence PAH - no evidence of PAH by echo. Await PFTs  - Explained incidence of PAH and pulmonary fibrosis in CTD and need for yearly screening. Await results of PFTs. Plan repeat echo 1 year.   2. HTN - Blood pressure well controlled. Continue current regimen.   Glori Bickers, MD  4:21 PM

## 2020-06-22 ENCOUNTER — Encounter (HOSPITAL_COMMUNITY): Payer: Self-pay | Admitting: Internal Medicine

## 2020-06-22 ENCOUNTER — Ambulatory Visit (HOSPITAL_BASED_OUTPATIENT_CLINIC_OR_DEPARTMENT_OTHER)
Admission: RE | Admit: 2020-06-22 | Discharge: 2020-06-22 | Disposition: A | Discharge status: Home / Self Care | Payer: Medicare Other | Source: Ambulatory Visit | Attending: Internal Medicine | Admitting: Internal Medicine

## 2020-06-22 ENCOUNTER — Other Ambulatory Visit: Payer: Self-pay

## 2020-06-22 ENCOUNTER — Other Ambulatory Visit (HOSPITAL_COMMUNITY): Payer: Medicare Other

## 2020-06-22 ENCOUNTER — Ambulatory Visit (HOSPITAL_COMMUNITY)
Admission: RE | Admit: 2020-06-22 | Discharge: 2020-06-22 | Disposition: A | Discharge status: Home / Self Care | Payer: Medicare Other | Source: Ambulatory Visit | Attending: Internal Medicine | Admitting: Internal Medicine

## 2020-06-22 VITALS — BP 138/80 | HR 111 | Wt 127.4 lb

## 2020-06-22 DIAGNOSIS — M349 Systemic sclerosis, unspecified: Secondary | ICD-10-CM | POA: Insufficient documentation

## 2020-06-22 DIAGNOSIS — E785 Hyperlipidemia, unspecified: Secondary | ICD-10-CM | POA: Diagnosis not present

## 2020-06-22 LAB — ECHOCARDIOGRAM COMPLETE
Area-P 1/2: 2.79 cm2
S' Lateral: 3.1 cm

## 2020-06-22 NOTE — Progress Notes (Signed)
Echocardiogram 2D Echocardiogram has been performed.  Oneal Deputy Yarel Kilcrease 06/22/2020, 10:30 AM

## 2020-06-24 ENCOUNTER — Other Ambulatory Visit: Payer: Self-pay

## 2020-06-24 DIAGNOSIS — M1711 Unilateral primary osteoarthritis, right knee: Secondary | ICD-10-CM

## 2020-06-25 ENCOUNTER — Other Ambulatory Visit: Payer: Self-pay

## 2020-06-25 ENCOUNTER — Ambulatory Visit (INDEPENDENT_AMBULATORY_CARE_PROVIDER_SITE_OTHER): Payer: Medicare Other | Admitting: Physical Therapy

## 2020-06-25 ENCOUNTER — Encounter: Payer: Self-pay | Admitting: Family Medicine

## 2020-06-25 DIAGNOSIS — M6281 Muscle weakness (generalized): Secondary | ICD-10-CM | POA: Diagnosis not present

## 2020-06-25 DIAGNOSIS — M25661 Stiffness of right knee, not elsewhere classified: Secondary | ICD-10-CM | POA: Diagnosis not present

## 2020-06-25 DIAGNOSIS — R2689 Other abnormalities of gait and mobility: Secondary | ICD-10-CM | POA: Diagnosis not present

## 2020-06-25 DIAGNOSIS — M25561 Pain in right knee: Secondary | ICD-10-CM

## 2020-06-25 DIAGNOSIS — R600 Localized edema: Secondary | ICD-10-CM

## 2020-06-25 NOTE — Therapy (Signed)
[facility] [address] [place], [place], [address] Phone: [phone]   Fax:  [phone]  Physical Therapy Evaluation  Patient Details  Name: Rachel Duran MRN: [id number] Date of Birth: Feb 24, 1956 Referring Provider (PT): Marlou Sa Tonna Corner, MD   Encounter Date: 06/25/2020   PT End of Session - 06/25/20 1610    Visit Number 1    Number of Visits 16    Date for PT Re-Evaluation 08/20/20    PT Start Time 1430    PT Stop Time 1510    PT Time Calculation (min) 40 min    Activity Tolerance Patient tolerated treatment well           Past Medical History:  Diagnosis Date  . Abdominal pain last 4 months   and nausea also  . Allergy   . Anemia    history of  . Anxiety   . Bipolar disorder ([place])    atpical bipolar disorder  . 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 C6- C7 -hx. past fusion(bone graft used)  . Cholecystitis   . Chronic pain   . DDD (degenerative disc disease), lumbar 09/2015   dextroscoliosis with multilevel DDD and facet arthrosis most notable for R foraminal disc protrusion L4/5 producing severe R neural foraminal stenosis abutting R L4 nerve root, moderate spinal canal and mild lat recesss and R neural foraminal stenosis L3/4 (MRI)  . Depression    bipolar depression  . Disorders of porphyrin metabolism   . Felon of finger of left hand 11/10/2016  . Fibromyalgia   . GERD (gastroesophageal reflux disease)   . Headache    occasionally  . Hypertension   . Internal hemorrhoids   . Interstitial cystitis 06-06-12   hx.  . Irritable bowel syndrome   . PONV (postoperative nausea and vomiting)    now uses stomach blockers and no ponv  . 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 Raynauds disease    hx.  . Seronegative arthritis    [name]  . 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 ANTERIOR CERVICAL DECOMP/DISCECTOMY FUSION  2004   C5/6, C6/7  . ANTERIOR CERVICAL DECOMP/DISCECTOMY FUSION  02/2016   C3/4, C4/5 with plating [name])  . AUGMENTATION MAMMAPLASTY Bilateral 03/25/2010  . BREAST ENHANCEMENT SURGERY  2010  . BREAST IMPLANT EXCHANGE  10/2014   exchange saline implants, B mastopexy/capsulorraphy ([facility])  . BUNIONECTOMY Bilateral yrs ago  . [place]   x 1  . CHOLECYSTECTOMY  06/11/2012   Procedure: LAPAROSCOPIC CHOLECYSTECTOMY WITH INTRAOPERATIVE CHOLANGIOGRAM;  Surgeon: [name], MD,FACS;  Location: [facility];  Service: General;  Laterality: N/A;  . COLONOSCOPY  02/2018   done for positive cologuard - WNL, rpt 10 yrs Fuller Plan)  . CYSTOSCOPY    . ERCP  05/22/2012   Procedure: ENDOSCOPIC RETROGRADE CHOLANGIOPANCREATOGRAPHY (ERCP);  Surgeon: [name], MD,FACG;  Location: [facility];  Service: Endoscopy;  Laterality: N/A;  . ERCP N/A 09/17/2013   Procedure: ENDOSCOPIC RETROGRADE CHOLANGIOPANCREATOGRAPHY (ERCP);  Surgeon: [name], MD;  Location: [facility];  Service: Endoscopy;  Laterality: N/A;  . ERCP N/A 09/27/2018   Procedure: ENDOSCOPIC RETROGRADE CHOLANGIOPANCREATOGRAPHY (ERCP);  Surgeon: [name],  MD;  Location: [facility];  Service: Endoscopy;  Laterality: N/A;  . ESOPHAGOGASTRODUODENOSCOPY  09/2016   WNL. esophagus dilated Fuller Plan)  . HEMORRHOID BANDING  09-23-13   --Dr. Greer Pickerel  . HERNIA REPAIR     inguinal  . HYSTEROSCOPY W/ ENDOMETRIAL ABLATION    . NASAL SINUS SURGERY     x5  . PARTIAL KNEE ARTHROPLASTY Right 06/01/2020   Procedure: Right knee patellofemoral replacement;  Surgeon: [name], MD;  Location: [facility];  Service: Orthopedics;  Laterality: Right;  . REMOVAL OF STONES  09/27/2018   Procedure: REMOVAL OF STONES;  Surgeon:  [name], MD;  Location: [facility];  Service: Endoscopy;;  . [name]  09/27/2018   Procedure: SPHINCTEROTOMY;  Surgeon: [name], MD;  Location: [facility];  Service: Endoscopy;;  . TOTAL HIP ARTHROPLASTY Left 03/12/2019   Procedure: LEFT TOTAL HIP ARTHROPLASTY ANTERIOR APPROACH;  [name], [name], MD)  . UPPER GASTROINTESTINAL ENDOSCOPY      There were no vitals filed for this visit.    Subjective Assessment - 06/25/20 1619    Subjective She had Right knee patellofemoral replacement on 06/01/20. She had CPM machine and home health PT. She relays the pain isnt too bad and she has been doing all the exercises the HHPT gave her.    How long can you stand comfortably? she thinks one hour    Patient Stated Goals be able to walk her dogs 2 miles    Currently in Pain? Yes    Pain Score 3     Pain Location Knee    Pain Orientation Right    Pain Descriptors / Indicators Aching;Tightness    Pain Type Surgical pain    Pain Onset More than a month ago    Pain Frequency Intermittent    Aggravating Factors  bending or straightnening her knee    Pain Relieving Factors rest, ice              [facility] PT Assessment - 06/25/20 0001      Assessment   Medical Diagnosis Right knee patellofemoral replacement on 06/01/20.     Referring Provider (PT) Marlou Sa Tonna Corner, MD    Onset Date/Surgical Date 08/20/20    Next MD Visit 07/08/20      Balance Screen   Has the patient fallen in the past 6 months No    Has the patient had a decrease in activity level because of a fear of falling?  No    Is the patient reluctant to leave their home because of a fear of falling?  No      Home Ecologist residence      Prior Function   Level of Independence Independent    Vocation On disability    Leisure walk her dogs      Cognition   Overall Cognitive Status Within Functional Limits for tasks assessed      Observation/Other Assessments    Focus on Therapeutic Outcomes (FOTO)  43%      ROM / Strength   AROM / PROM / Strength AROM;PROM;Strength      AROM   AROM Assessment Site Knee    Right/Left Knee Right    Right Knee Extension -3    Right Knee Flexion 120      PROM   PROM Assessment Site Knee    Right/Left Knee Right    Right Knee Extension -1    Right Knee Flexion  129      Strength   Overall Strength Comments tested in sitting    Strength Assessment Site Knee;Hip    Right/Left Hip Right    Right Hip Flexion 4+/5    Right Hip ABduction 4+/5    Right/Left Knee Right    Right Knee Flexion 4/5    Right Knee Extension 4/5      Ambulation/Gait   Ambulation/Gait Yes    Ambulation/Gait Assistance 5: Supervision    Ambulation Distance (Feet) 75 Feet    Assistive device Straight cane      Standardized Balance Assessment   Standardized Balance Assessment Five Times Sit to Stand    Five times sit to stand comments  14 sec, no UE support                      Objective measurements completed on examination: See above findings.       [facility] PT Treatment/Exercise - 06/25/20 0001      Exercises   Exercises Knee/Hip      Knee/Hip Exercises: Stretches   Quad Stretch Right;3 reps;30 seconds    Quad Stretch Limitations supine with strap leg EOB      Knee/Hip Exercises: Aerobic   Recumbent Bike 6 min       Knee/Hip Exercises: Seated   Other Seated Knee/Hip Exercises seated SLR 2X10 reps                    PT Short Term Goals - 06/25/20 1621      PT SHORT TERM GOAL #1   Title Be independent with initial home exercise program for self-management of symptoms.    Time 4    Period Weeks    Status New    Target Date 07/23/20             PT Long Term Goals - 06/25/20 1621      PT LONG TERM GOAL #1   Title Pt will improve FOTO score to at least 57%    Time 8    Period Weeks    Status New    Target Date 08/20/20      PT LONG TERM GOAL #2   Title She will be able to  ambulate community distances no AD  and stairs with less than 3/10 pain    Time 8    Period Weeks    Status New      PT LONG TERM GOAL #3   Title Improve Rt knee strength to 5/5 for improved ability to allow patient to complete valued functional tasks such as gardening, and walking dogs with less difficulty.    Time 8    Period Weeks    Status New      PT LONG TERM GOAL #4   Title Improve Rt knee ROM 0-130 deg    Time 8    Period Weeks      PT LONG TERM GOAL #5   Title improve 5TSTS test to 12 seconds without UE support    Baseline 14    Time 8    Period Weeks    Status New                  Plan - 06/25/20 1611    Clinical Impression Statement Pt presents to PT S/P Rt knee patellofemoral replacement on 06/01/20. She is doing very well post op. She will benefit from skilled PT to address her deficits in Rt  knee strength, ROM, gait, and standing tolerance.    Examination-Activity Limitations Stairs;Stand;Lift;Locomotion Level    Examination-Participation Restrictions Cleaning;Driving;Community Activity;Shop;Laundry    Stability/Clinical Decision Making Stable/Uncomplicated    Clinical Decision Making Low    Rehab Potential Excellent    PT Frequency 2x / week    PT Duration 8 weeks   6-8   PT Treatment/Interventions ADLs/Self Care Home Management;Cryotherapy;Electrical Stimulation;Iontophoresis 4mg /ml Dexamethasone;Moist Heat;Ultrasound;Gait training;Stair training;Therapeutic activities;Therapeutic exercise;Balance training;Neuromuscular re-education;Manual techniques;Scar mobilization;Passive range of motion;Dry needling;Joint Manipulations;Vasopneumatic Device;Taping    PT Next Visit Plan Rt knee strength, gait, ROM as tolerated    PT Home Exercise Plan Access Code: [email or web address]    Consulted and Agree with Plan of Care Patient           Patient will benefit from skilled therapeutic intervention in order to improve the following deficits and impairments:  Abnormal gait,  Decreased activity tolerance, Decreased balance, Decreased mobility, Decreased endurance, Decreased range of motion, Decreased strength, Decreased scar mobility, Difficulty walking, Hypomobility, Increased edema, Impaired flexibility, Increased muscle spasms, Pain  Visit Diagnosis: Acute pain of right knee  Stiffness of right knee, not elsewhere classified  Muscle weakness (generalized)  Other abnormalities of gait and mobility  Localized edema     Problem List Patient Active Problem List   Diagnosis Date Noted  . S/P knee surgery 06/01/2020  . Pre-op evaluation 05/27/2020  . Pulmonary nodule 05/27/2020  . 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 Chronic patellofemoral pain of right knee 05/05/2020  . Positive ANA (antinuclear antibody) 06/05/2019  . 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  . Unilateral primary osteoarthritis, left hip 01/28/2019  . Pelvic pain 12/12/2018  . Common bile duct (CBD) obstruction   . Elevated alkaline phosphatase level   . Venous insufficiency of left lower extremity 07/04/2018  . Dyslipidemia 09/22/2017  . DDD (degenerative disc disease), cervical 04/03/2017  . DDD (degenerative disc disease), lumbar 04/03/2017  . Onychomycosis 03/21/2017  . Dysphagia 10/18/2016  . Encounter for chronic pain management 10/18/2016  . Biliary stasis 09/26/2016  . Fever blister 08/18/2016  . Urinary urgency 07/05/2016  . HNP (herniated nucleus pulposus), lumbar 09/23/2015  . Sinus congestion 07/30/2015  . Iron deficiency 07/30/2015  . Health maintenance examination 06/15/2015  . 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 Headache 03/09/2015  . Clavicle enlargement 12/13/2014  . Prediabetes 12/13/2014  . Plantar fasciitis, bilateral 09/11/2014  . Advanced care planning/counseling discussion 06/10/2014  . Medicare annual wellness visit, subsequent 06/10/2014  . Abnormal thyroid function test 06/10/2014  . Chronic pain syndrome 06/10/2014  . CFS  (chronic fatigue syndrome) 04/08/2014  . Postmenopausal atrophic vaginitis 10/19/2012  . 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 Cervical disc disorder with radiculopathy of cervical region 05/28/2010  . CHEST PAIN UNSPECIFIED 02/28/2008  . APHTHOUS ULCERS 01/31/2008  . Chronic insomnia 11/09/2007  . Drug-induced constipation 10/11/2007  . Allergic rhinitis 04/12/2007  . MDD (major depressive disorder), recurrent episode, moderate ([place]) 03/05/2007  . [name] syndrome 03/05/2007  . GERD 03/05/2007  . ROSACEA 03/05/2007  . NEURALGIA 03/05/2007  . 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 Chronic interstitial cystitis 12/06/2006  . Fibromyalgia 12/06/2006    Debbe Odea ,PT,DPT 06/25/2020, 4:24 PM  [facility] [address] [place], [place], [address] Phone: [phone]   Fax:  [phone]  Name: Clarene Curran MRN: [id number] Date of Birth: 07/23/1956

## 2020-06-25 NOTE — Patient Instructions (Signed)
Access Code: [email or web address] URL: [email or web address] Date: 06/25/2020 Prepared by: Elsie Ra  Exercises Sit to Stand without Arm Support - 2 x daily - 6 x weekly - 1-2 sets - 10 reps Seated Straight Leg Heel Taps - 2 x daily - 6 x weekly - 3 sets - 10 reps Heel Prop - 2 x daily - 6 x weekly - 1 sets - 5 min hold

## 2020-06-29 ENCOUNTER — Telehealth: Payer: Self-pay

## 2020-06-29 ENCOUNTER — Other Ambulatory Visit: Payer: Self-pay | Admitting: Family Medicine

## 2020-06-29 NOTE — Telephone Encounter (Signed)
Received MyChart message from pt stating authorization for for eszopiclone Johnnye Sima) will expire on 07/24/20.  Submitted PA; key:  [id number], PA case ID:  [id number].  Decision pending.

## 2020-06-29 NOTE — Telephone Encounter (Signed)
Submitted PA.  Notified pt.

## 2020-06-29 NOTE — Telephone Encounter (Signed)
Last office visit 05/27/2020 for Pre-Op exam.  Last refilled:  Listed as historical medication.  CPE scheduled for 07/10/2020.  UDS/Contract 04/19/2019.

## 2020-06-30 ENCOUNTER — Other Ambulatory Visit: Payer: Self-pay | Admitting: Family Medicine

## 2020-06-30 DIAGNOSIS — R7303 Prediabetes: Secondary | ICD-10-CM

## 2020-06-30 DIAGNOSIS — E785 Hyperlipidemia, unspecified: Secondary | ICD-10-CM

## 2020-06-30 NOTE — Telephone Encounter (Signed)
ERx 

## 2020-07-01 ENCOUNTER — Ambulatory Visit (INDEPENDENT_AMBULATORY_CARE_PROVIDER_SITE_OTHER): Payer: Medicare Other | Admitting: Physical Therapy

## 2020-07-01 ENCOUNTER — Other Ambulatory Visit: Payer: Self-pay

## 2020-07-01 ENCOUNTER — Encounter: Payer: Self-pay | Admitting: Physical Therapy

## 2020-07-01 ENCOUNTER — Encounter: Payer: Self-pay | Admitting: Family Medicine

## 2020-07-01 ENCOUNTER — Encounter: Payer: Self-pay | Admitting: Obstetrics and Gynecology

## 2020-07-01 DIAGNOSIS — M6281 Muscle weakness (generalized): Secondary | ICD-10-CM | POA: Diagnosis not present

## 2020-07-01 DIAGNOSIS — M25561 Pain in right knee: Secondary | ICD-10-CM | POA: Diagnosis not present

## 2020-07-01 DIAGNOSIS — R2689 Other abnormalities of gait and mobility: Secondary | ICD-10-CM | POA: Diagnosis not present

## 2020-07-01 DIAGNOSIS — R262 Difficulty in walking, not elsewhere classified: Secondary | ICD-10-CM

## 2020-07-01 DIAGNOSIS — M25661 Stiffness of right knee, not elsewhere classified: Secondary | ICD-10-CM

## 2020-07-01 DIAGNOSIS — G8929 Other chronic pain: Secondary | ICD-10-CM

## 2020-07-01 DIAGNOSIS — M25562 Pain in left knee: Secondary | ICD-10-CM

## 2020-07-01 DIAGNOSIS — R600 Localized edema: Secondary | ICD-10-CM

## 2020-07-01 NOTE — Patient Instructions (Signed)
Access Code: [email or web address] URL: [email or web address] Date: 07/01/2020 Prepared by: Jamey Reas  Exercises Sit to Stand without Arm Support - 2 x daily - 7 x weekly - 1-2 sets - 10 reps Seated Small Alternating Straight Leg Lifts with Heel Touch - 2 x daily - 7 x weekly - 3 sets - 10 reps Supine Knee Extension Mobilization with Weight - 2 x daily - 7 x weekly - 5 sets - 1 min hold Supine Active Straight Leg Raise - 2 x daily - 7 x weekly - 3 sets - 10 reps - 5 seconds hold Supine Short Arc Quad - 2 x daily - 7 x weekly - 3 sets - 10 reps - 5 seconds hold Sidelying Hip Abduction - 2 x daily - 7 x weekly - 3 sets - 10 reps - 5 seconds hold Sidelying Hip Flexion & Extension - 2 x daily - 7 x weekly - 3 sets - 10 reps - 5 seconds hold

## 2020-07-01 NOTE — Therapy (Signed)
[facility] [address] [place], [place], [address] Phone: [phone]   Fax:  [phone]  Physical Therapy Treatment  Patient Details  Name: Lamya Lausch MRN: [id number] Date of Birth: [birth year] Referring Provider (PT): Marlou Sa Tonna Corner, MD   Encounter Date: 07/01/2020   PT End of Session - 07/01/20 1429    Visit Number 2    Number of Visits 16    Date for PT Re-Evaluation 08/20/20    PT Start Time 1430    PT Stop Time 1525    PT Time Calculation (min) 55 min    Activity Tolerance Patient tolerated treatment well           Past Medical History:  Diagnosis Date  . Abdominal pain last 4 months   and nausea also  . Allergy   . Anemia    history of  . Anxiety   . Bipolar disorder ([place])    atpical bipolar disorder  . 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 C6- C7 -hx. past fusion(bone graft used)  . Cholecystitis   . Chronic pain   . DDD (degenerative disc disease), lumbar 09/2015   dextroscoliosis with multilevel DDD and facet arthrosis most notable for R foraminal disc protrusion L4/5 producing severe R neural foraminal stenosis abutting R L4 nerve root, moderate spinal canal and mild lat recesss and R neural foraminal stenosis L3/4 (MRI)  . Depression    bipolar depression  . Disorders of porphyrin metabolism   . Felon of finger of left hand 11/10/2016  . Fibromyalgia   . GERD (gastroesophageal reflux disease)   . Headache    occasionally  . Hypertension   . Internal hemorrhoids   . Interstitial cystitis 06-06-12   hx.  . Irritable bowel syndrome   . PONV (postoperative nausea and vomiting)    now uses stomach blockers and no ponv  . 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 Raynauds disease    hx.  . Seronegative arthritis    [name]  . 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 ANTERIOR CERVICAL DECOMP/DISCECTOMY FUSION  2004   C5/6, C6/7  . ANTERIOR CERVICAL DECOMP/DISCECTOMY FUSION  02/2016   C3/4, C4/5 with plating [name])  . AUGMENTATION MAMMAPLASTY Bilateral 03/25/2010  . BREAST ENHANCEMENT SURGERY  2010  . BREAST IMPLANT EXCHANGE  10/2014   exchange saline implants, B mastopexy/capsulorraphy ([facility] [place])  . BUNIONECTOMY Bilateral yrs ago  . [place]   x 1  . CHOLECYSTECTOMY  06/11/2012   Procedure: LAPAROSCOPIC CHOLECYSTECTOMY WITH INTRAOPERATIVE CHOLANGIOGRAM;  Surgeon: [name], MD,FACS;  Location: [facility];  Service: General;  Laterality: N/A;  . COLONOSCOPY  02/2018   done for positive cologuard - WNL, rpt 10 yrs Fuller Plan)  . CYSTOSCOPY    . ERCP  05/22/2012   Procedure: ENDOSCOPIC RETROGRADE CHOLANGIOPANCREATOGRAPHY (ERCP);  Surgeon: [name], MD,FACG;  Location: [facility];  Service: Endoscopy;  Laterality: N/A;  . ERCP N/A 09/17/2013   Procedure: ENDOSCOPIC RETROGRADE CHOLANGIOPANCREATOGRAPHY (ERCP);  Surgeon: [name], MD;  Location: [facility];  Service: Endoscopy;  Laterality: N/A;  . ERCP N/A 09/27/2018   Procedure: ENDOSCOPIC RETROGRADE CHOLANGIOPANCREATOGRAPHY (ERCP);  Surgeon: [name],  MD;  Location: [facility];  Service: Endoscopy;  Laterality: N/A;  . ESOPHAGOGASTRODUODENOSCOPY  09/2016   WNL. esophagus dilated Fuller Plan)  . HEMORRHOID BANDING  09-23-13   --Dr. Greer Pickerel  . HERNIA REPAIR     inguinal  . HYSTEROSCOPY W/ ENDOMETRIAL ABLATION    . NASAL SINUS SURGERY     x5  . PARTIAL KNEE ARTHROPLASTY Right 06/01/2020   Procedure: Right knee patellofemoral replacement;  Surgeon: [name], MD;  Location: [facility];  Service: Orthopedics;  Laterality: Right;  . REMOVAL OF STONES  09/27/2018   Procedure: REMOVAL OF STONES;  Surgeon:  [name], MD;  Location: [facility];  Service: Endoscopy;;  . [name]  09/27/2018   Procedure: SPHINCTEROTOMY;  Surgeon: [name], MD;  Location: [facility];  Service: Endoscopy;;  . TOTAL HIP ARTHROPLASTY Left 03/12/2019   Procedure: LEFT TOTAL HIP ARTHROPLASTY ANTERIOR APPROACH;  [name], [name], MD)  . UPPER GASTROINTESTINAL ENDOSCOPY      There were no vitals filed for this visit.   Subjective Assessment - 07/01/20 1430    Subjective She has been doing her exercises without issues.    How long can you stand comfortably? she thinks one hour    Patient Stated Goals be able to walk her dogs 2 miles    Currently in Pain? Yes    Pain Score 2     Pain Location Knee    Pain Orientation Right;Proximal;Distal   prox & distal to patella   Pain Descriptors / Indicators Aching;Sore;Tightness    Pain Type Acute pain;Surgical pain    Pain Onset More than a month ago    Aggravating Factors  arising from sitting. heel slides straighten knee,    Pain Relieving Factors rest & ice                             [facility] Adult PT Treatment/Exercise - 07/01/20 1430      Transfers   Transfers Sit to Stand;Stand to Sit    Sit to Stand 5: Supervision;Without upper extremity assist;From chair/3-in-1    Sit to Stand Details Visual cues/gestures for sequencing;Verbal cues for technique    Stand to Sit 5: Supervision;Without upper extremity assist;To chair/3-in-1    Stand to Sit Details (indicate cue type and reason) Visual cues/gestures for sequencing;Verbal cues for technique      Self-Care   Self-Care ADL's    ADL's PT educated on using pillows in bed to tent sheets off feet & position. Pt verbalized understanding.        Knee/Hip Exercises: Aerobic   Recumbent Bike seat 5 level 1 for 6 minutes      Modalities   Modalities Vasopneumatic      Vasopneumatic   Number Minutes Vasopneumatic  10 minutes    Vasopnuematic Location  Knee   positioned in  TKE   Vasopneumatic Pressure Medium    Vasopneumatic Temperature  34             Access Code: [email or web address] URL: [email or web address] Date: 07/01/2020 Prepared by: Jamey Reas  Exercises Sit to Stand without Arm Support - 2 x daily - 7 x weekly - 1-2 sets - 10 reps Seated Small Alternating Straight Leg Lifts with Heel Touch - 2 x daily - 7 x weekly - 3 sets - 10 reps Supine Knee Extension Mobilization with Weight - 2 x daily - 7 x weekly - 5 sets - 1  min hold Supine Active Straight Leg Raise - 2 x daily - 7 x weekly - 3 sets - 10 reps - 5 seconds hold Supine Short Arc Quad - 2 x daily - 7 x weekly - 3 sets - 10 reps - 5 seconds hold Sidelying Hip Abduction - 2 x daily - 7 x weekly - 3 sets - 10 reps - 5 seconds hold Sidelying Hip Flexion & Extension - 2 x daily - 7 x weekly - 3 sets - 10 reps - 5 seconds hold       PT Education - 07/01/20 1610    Education Details reviewed & updated HEP   Medbridge [email or web address]    Person(s) Educated Patient    Methods Explanation;Demonstration;Tactile cues;Verbal cues;Handout    Comprehension Verbalized understanding;Returned demonstration;Verbal cues required;Tactile cues required            PT Short Term Goals - 06/25/20 1621      PT SHORT TERM GOAL #1   Title Be independent with initial home exercise program for self-management of symptoms.    Time 4    Period Weeks    Status New    Target Date 07/23/20             PT Long Term Goals - 06/25/20 1621      PT LONG TERM GOAL #1   Title Pt will improve FOTO score to at least 57%    Time 8    Period Weeks    Status New    Target Date 08/20/20      PT LONG TERM GOAL #2   Title She will be able to ambulate community distances no AD  and stairs with less than 3/10 pain    Time 8    Period Weeks    Status New      PT LONG TERM GOAL #3   Title Improve Rt knee strength to 5/5 for improved ability to allow patient to complete valued functional tasks such as gardening,  and walking dogs with less difficulty.    Time 8    Period Weeks    Status New      PT LONG TERM GOAL #4   Title Improve Rt knee ROM 0-130 deg    Time 8    Period Weeks      PT LONG TERM GOAL #5   Title improve 5TSTS test to 12 seconds without UE support    Baseline 14    Time 8    Period Weeks    Status New                 Plan - 07/01/20 1429    Clinical Impression Statement Pateint appears to understand HEP updated today.    Examination-Activity Limitations Stairs;Stand;Lift;Locomotion Level    Examination-Participation Restrictions Cleaning;Driving;Community Activity;Shop;Laundry    Stability/Clinical Decision Making Stable/Uncomplicated    Rehab Potential Excellent    PT Frequency 2x / week    PT Duration 8 weeks   6-8   PT Treatment/Interventions ADLs/Self Care Home Management;Cryotherapy;Electrical Stimulation;Iontophoresis 4mg /ml Dexamethasone;Moist Heat;Ultrasound;Gait training;Stair training;Therapeutic activities;Therapeutic exercise;Balance training;Neuromuscular re-education;Manual techniques;Scar mobilization;Passive range of motion;Dry needling;Joint Manipulations;Vasopneumatic Device;Taping    PT Next Visit Plan Rt knee strength, gait, ROM as tolerated    PT Home Exercise Plan Access Code: [email or web address]    Consulted and Agree with Plan of Care Patient           Patient will benefit from skilled therapeutic intervention in order to improve the following deficits and  impairments:  Abnormal gait, Decreased activity tolerance, Decreased balance, Decreased mobility, Decreased endurance, Decreased range of motion, Decreased strength, Decreased scar mobility, Difficulty walking, Hypomobility, Increased edema, Impaired flexibility, Increased muscle spasms, Pain  Visit Diagnosis: Acute pain of right knee  Stiffness of right knee, not elsewhere classified  Muscle weakness (generalized)  Other abnormalities of gait and mobility  Localized edema  Chronic pain of  right knee  Chronic pain of left knee  Difficulty in walking, not elsewhere classified     Problem List Patient Active Problem List   Diagnosis Date Noted  . S/P knee surgery 06/01/2020  . Pre-op evaluation 05/27/2020  . Pulmonary nodule 05/27/2020  . 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 Chronic patellofemoral pain of right knee 05/05/2020  . Positive ANA (antinuclear antibody) 06/05/2019  . 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  . Unilateral primary osteoarthritis, left hip 01/28/2019  . Pelvic pain 12/12/2018  . Common bile duct (CBD) obstruction   . Elevated alkaline phosphatase level   . Venous insufficiency of left lower extremity 07/04/2018  . Dyslipidemia 09/22/2017  . DDD (degenerative disc disease), cervical 04/03/2017  . DDD (degenerative disc disease), lumbar 04/03/2017  . Onychomycosis 03/21/2017  . Dysphagia 10/18/2016  . Encounter for chronic pain management 10/18/2016  . Biliary stasis 09/26/2016  . Fever blister 08/18/2016  . Urinary urgency 07/05/2016  . HNP (herniated nucleus pulposus), lumbar 09/23/2015  . Sinus congestion 07/30/2015  . Iron deficiency 07/30/2015  . Health maintenance examination 06/15/2015  . 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 Headache 03/09/2015  . Clavicle enlargement 12/13/2014  . Prediabetes 12/13/2014  . Plantar fasciitis, bilateral 09/11/2014  . Advanced care planning/counseling discussion 06/10/2014  . Medicare annual wellness visit, subsequent 06/10/2014  . Abnormal thyroid function test 06/10/2014  . Chronic pain syndrome 06/10/2014  . CFS (chronic fatigue syndrome) 04/08/2014  . Postmenopausal atrophic vaginitis 10/19/2012  . 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 Cervical disc disorder with radiculopathy of cervical region 05/28/2010  . CHEST PAIN UNSPECIFIED 02/28/2008  . APHTHOUS ULCERS 01/31/2008  . Chronic insomnia 11/09/2007  . Drug-induced constipation 10/11/2007  . Allergic  rhinitis 04/12/2007  . MDD (major depressive disorder), recurrent episode, moderate ([place]) 03/05/2007  . [name] syndrome 03/05/2007  . GERD 03/05/2007  . ROSACEA 03/05/2007  . NEURALGIA 03/05/2007  . 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 Chronic interstitial cystitis 12/06/2006  . Fibromyalgia 12/06/2006    Jamey Reas PT, DPT 07/01/2020, 4:14 PM  [facility] [address] [place], [place], [address] Phone: [phone]   Fax:  [phone]  Name: Baleria Wyman MRN: [id number] Date of Birth: [birth year]

## 2020-07-02 ENCOUNTER — Other Ambulatory Visit (INDEPENDENT_AMBULATORY_CARE_PROVIDER_SITE_OTHER): Payer: Medicare Other

## 2020-07-02 ENCOUNTER — Ambulatory Visit: Payer: Medicare Other

## 2020-07-02 ENCOUNTER — Other Ambulatory Visit: Payer: Self-pay | Admitting: Family Medicine

## 2020-07-02 DIAGNOSIS — R7303 Prediabetes: Secondary | ICD-10-CM

## 2020-07-02 DIAGNOSIS — E611 Iron deficiency: Secondary | ICD-10-CM

## 2020-07-02 DIAGNOSIS — M810 Age-related osteoporosis without current pathological fracture: Secondary | ICD-10-CM | POA: Insufficient documentation

## 2020-07-02 DIAGNOSIS — E785 Hyperlipidemia, unspecified: Secondary | ICD-10-CM

## 2020-07-02 DIAGNOSIS — M8580 Other specified disorders of bone density and structure, unspecified site: Secondary | ICD-10-CM | POA: Insufficient documentation

## 2020-07-02 DIAGNOSIS — M85851 Other specified disorders of bone density and structure, right thigh: Secondary | ICD-10-CM

## 2020-07-02 DIAGNOSIS — R946 Abnormal results of thyroid function studies: Secondary | ICD-10-CM

## 2020-07-02 LAB — LIPID PANEL
Cholesterol: 186 mg/dL (ref 0–200)
HDL: 67.5 mg/dL (ref >39.00)
LDL Cholesterol: 100 mg/dL — ABNORMAL HIGH (ref 0–99)
NonHDL: 118.02
Total CHOL/HDL Ratio: 3
Triglycerides: 92 mg/dL (ref 0.0–149.0)
VLDL: 18.4 mg/dL (ref 0.0–40.0)

## 2020-07-02 LAB — HEMOGLOBIN A1C: Hgb A1c MFr Bld: 5.7 % (ref 4.6–6.5)

## 2020-07-07 ENCOUNTER — Encounter: Payer: Medicare Other | Admitting: Physical Therapy

## 2020-07-07 ENCOUNTER — Telehealth: Payer: Self-pay

## 2020-07-07 ENCOUNTER — Other Ambulatory Visit: Payer: Self-pay

## 2020-07-07 ENCOUNTER — Ambulatory Visit (INDEPENDENT_AMBULATORY_CARE_PROVIDER_SITE_OTHER): Payer: Medicare Other | Admitting: Pulmonary Disease

## 2020-07-07 DIAGNOSIS — M349 Systemic sclerosis, unspecified: Secondary | ICD-10-CM

## 2020-07-07 LAB — PULMONARY FUNCTION TEST
DL/VA % pred: 88 %
DL/VA: 3.69 ml/min/mmHg/L
DLCO cor % pred: 87 %
DLCO cor: 17.76 ml/min/mmHg
DLCO unc % pred: 85 %
DLCO unc: 17.43 ml/min/mmHg
FEF 25-75 Post: 3.48 L/sec
FEF 25-75 Pre: 2.98 L/sec
FEF2575-%Change-Post: 16 %
FEF2575-%Pred-Post: 152 %
FEF2575-%Pred-Pre: 129 %
FEV1-%Change-Post: 1 %
FEV1-%Pred-Post: 103 %
FEV1-%Pred-Pre: 101 %
FEV1-Post: 2.63 L
FEV1-Pre: 2.59 L
FEV1FVC-%Change-Post: 3 %
FEV1FVC-%Pred-Pre: 103 %
FEV6-%Change-Post: 0 %
FEV6-%Pred-Post: 99 %
FEV6-%Pred-Pre: 100 %
FEV6-Post: 3.18 L
FEV6-Pre: 3.21 L
FEV6FVC-%Change-Post: 0 %
FEV6FVC-%Pred-Post: 103 %
FEV6FVC-%Pred-Pre: 103 %
FVC-%Change-Post: -1 %
FVC-%Pred-Post: 96 %
FVC-%Pred-Pre: 97 %
FVC-Post: 3.18 L
FVC-Pre: 3.23 L
Post FEV1/FVC ratio: 83 %
Post FEV6/FVC ratio: 100 %
Pre FEV1/FVC ratio: 80 %
Pre FEV6/FVC Ratio: 99 %
RV % pred: 96 %
RV: 1.98 L
TLC % pred: 101 %
TLC: 5.2 L

## 2020-07-07 NOTE — Progress Notes (Signed)
PFT done today. 

## 2020-07-07 NOTE — Telephone Encounter (Signed)
Call to patient. Patient states she just received 90 day supply of Intrarosa through [facility]. RN advised patient that when next refill is requested, [facility] will send PA to our office to be completed and sent to insurance company. Patient verbalized understanding and appreciative of phone call.   Routing to provider and will close encounter.

## 2020-07-07 NOTE — Telephone Encounter (Signed)
[facility] Hello Dr.Silva, hope this finds you doing well! During my exam with you this past summer you prescribed Intrarosa and asked me to advise you after some usage time how it was working. This seems to work very well for me and I am having good results. I just submitted my last refill into [facility] for a 90 day supply. I am writing to advise you that my insurance is only covering this through 07-24-20 and then I will need a new prior authorization for them to continue to cover this medication. I did not want to wait until the last minute to ask you to do this as I know getting a prior authorization for a medication can take some time. At your convenience I ask that you go ahead and proceed with obtaining a prior authorization. If you have any questions or concerns please reach out to me.  Thank you for your help with this, I greatly appreciate your time! Wishing you a wonderful holiday season!   Juliann Pulse

## 2020-07-08 ENCOUNTER — Ambulatory Visit (INDEPENDENT_AMBULATORY_CARE_PROVIDER_SITE_OTHER): Payer: Medicare Other | Admitting: Rehabilitative and Restorative Service Providers"

## 2020-07-08 ENCOUNTER — Encounter: Payer: Self-pay | Admitting: Rehabilitative and Restorative Service Providers"

## 2020-07-08 ENCOUNTER — Ambulatory Visit (INDEPENDENT_AMBULATORY_CARE_PROVIDER_SITE_OTHER): Payer: Medicare Other | Admitting: Orthopedic Surgery

## 2020-07-08 DIAGNOSIS — M2241 Chondromalacia patellae, right knee: Secondary | ICD-10-CM

## 2020-07-08 DIAGNOSIS — M25661 Stiffness of right knee, not elsewhere classified: Secondary | ICD-10-CM | POA: Diagnosis not present

## 2020-07-08 DIAGNOSIS — M6281 Muscle weakness (generalized): Secondary | ICD-10-CM

## 2020-07-08 DIAGNOSIS — R262 Difficulty in walking, not elsewhere classified: Secondary | ICD-10-CM

## 2020-07-08 NOTE — Therapy (Signed)
[facility] [address] [place], [place], [address] Phone: [phone]   Fax:  [phone]  Physical Therapy Treatment  Patient Details  Name: Rachel Duran MRN: [id number] Date of Birth: 06-17-1956 Referring Provider (PT): Marlou Sa Tonna Corner, MD   Encounter Date: 07/08/2020   PT End of Session - 07/08/20 1713    Visit Number 3    Number of Visits 16    Date for PT Re-Evaluation 08/20/20    PT Start Time 2542    PT Stop Time 1558    PT Time Calculation (min) 54 min    Activity Tolerance Patient tolerated treatment well;No increased pain           Past Medical History:  Diagnosis Date  . Abdominal pain last 4 months   and nausea also  . Allergy   . Anemia    history of  . Anxiety   . Bipolar disorder ([place])    atpical bipolar disorder  . 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 C6- C7 -hx. past fusion(bone graft used)  . Cholecystitis   . Chronic pain   . DDD (degenerative disc disease), lumbar 09/2015   dextroscoliosis with multilevel DDD and facet arthrosis most notable for R foraminal disc protrusion L4/5 producing severe R neural foraminal stenosis abutting R L4 nerve root, moderate spinal canal and mild lat recesss and R neural foraminal stenosis L3/4 (MRI)  . Depression    bipolar depression  . Disorders of porphyrin metabolism   . Felon of finger of left hand 11/10/2016  . Fibromyalgia   . GERD (gastroesophageal reflux disease)   . Headache    occasionally  . Hypertension   . Internal hemorrhoids   . Interstitial cystitis 06-06-12   hx.  . Irritable bowel syndrome   . PONV (postoperative nausea and vomiting)    now uses stomach blockers and no ponv  . 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 Raynauds disease    hx.  . Seronegative arthritis    [name]   . 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 ANTERIOR CERVICAL DECOMP/DISCECTOMY FUSION  2004   C5/6, C6/7  . ANTERIOR CERVICAL DECOMP/DISCECTOMY FUSION  02/2016   C3/4, C4/5 with plating [name])  . AUGMENTATION MAMMAPLASTY Bilateral 03/25/2010  . BREAST ENHANCEMENT SURGERY  2010  . BREAST IMPLANT EXCHANGE  10/2014   exchange saline implants, B mastopexy/capsulorraphy ([facility])  . BUNIONECTOMY Bilateral yrs ago  . [place]   x 1  . CHOLECYSTECTOMY  06/11/2012   Procedure: LAPAROSCOPIC CHOLECYSTECTOMY WITH INTRAOPERATIVE CHOLANGIOGRAM;  Surgeon: [name], MD,FACS;  Location: [facility];  Service: General;  Laterality: N/A;  . COLONOSCOPY  02/2018   done for positive cologuard - WNL, rpt 10 yrs Fuller Plan)  . CYSTOSCOPY    . ERCP  05/22/2012   Procedure: ENDOSCOPIC RETROGRADE CHOLANGIOPANCREATOGRAPHY (ERCP);  Surgeon: [name], MD,FACG;  Location: [facility];  Service: Endoscopy;  Laterality: N/A;  . ERCP N/A 09/17/2013   Procedure: ENDOSCOPIC RETROGRADE CHOLANGIOPANCREATOGRAPHY (ERCP);  Surgeon: [name], MD;  Location: [facility];  Service: Endoscopy;  Laterality: N/A;  . ERCP N/A 09/27/2018   Procedure: ENDOSCOPIC RETROGRADE CHOLANGIOPANCREATOGRAPHY (ERCP);  Surgeon: Fuller Plan,  Pricilla Riffle, MD;  Location: [facility];  Service: Endoscopy;  Laterality: N/A;  . ESOPHAGOGASTRODUODENOSCOPY  09/2016   WNL. esophagus dilated Fuller Plan)  . HEMORRHOID BANDING  09-23-13   --Dr. Greer Pickerel  . HERNIA REPAIR     inguinal  . HYSTEROSCOPY W/ ENDOMETRIAL ABLATION    . NASAL SINUS SURGERY     x5  . PARTIAL KNEE ARTHROPLASTY Right 06/01/2020   Procedure: Right knee patellofemoral replacement;  Surgeon: [name], MD;  Location: [facility];  Service: Orthopedics;  Laterality: Right;  . REMOVAL OF STONES  09/27/2018   Procedure: REMOVAL OF  STONES;  Surgeon: [name], MD;  Location: [facility];  Service: Endoscopy;;  . [name]  09/27/2018   Procedure: SPHINCTEROTOMY;  Surgeon: [name], MD;  Location: [facility];  Service: Endoscopy;;  . TOTAL HIP ARTHROPLASTY Left 03/12/2019   Procedure: LEFT TOTAL HIP ARTHROPLASTY ANTERIOR APPROACH;  [name], [name], MD)  . UPPER GASTROINTESTINAL ENDOSCOPY      There were no vitals filed for this visit.   Subjective Assessment - 07/08/20 1550    Subjective [name] is very happy with her early progress.  Dr. Marlou Sa encouraged her to finish PT but she does not need to follow-up with him.    How long can you stand comfortably? she thinks one hour    Patient Stated Goals be able to walk her dogs 2 miles    Currently in Pain? Yes    Pain Score 2     Pain Location Knee    Pain Orientation Right    Pain Descriptors / Indicators Aching;Sore;Tightness    Pain Type Surgical pain;Chronic pain    Pain Onset More than a month ago    Pain Frequency Intermittent    Aggravating Factors  Too much WB, sleeping    Effect of Pain on Daily Activities Affects standing and sleeping.    Multiple Pain Sites No                             [facility] Adult PT Treatment/Exercise - 07/08/20 0001      Neuro Re-ed    Neuro Re-ed Details  Heel to toe balance: eyes open/closed/open with head moving side to side 3X each 30 seconds      Exercises   Exercises Knee/Hip      Knee/Hip Exercises: Aerobic   Recumbent Bike Seat 5 for 4 minutes and Seat 0 for 4 minutes      Knee/Hip Exercises: Machines for Strengthening   Cybex Knee Extension 5# 5X slow eccentrics; 10# 10X slow eccentrics      Knee/Hip Exercises: Seated   Long Arc Quad Strengthening;Both;3 sets;5 reps   slow eccentrics seated straight leg raises                 PT Education - 07/08/20 1712    Education Details Updated program with quadriceps strength emphasis due to excellent AROM.     Person(s) Educated Patient    Methods Explanation;Demonstration;Tactile cues;Verbal cues;Handout    Comprehension Verbalized understanding;Returned demonstration;Verbal cues required;Tactile cues required;Need further instruction            PT Short Term Goals - 07/08/20 1713      PT SHORT TERM GOAL #1   Title Be independent with initial home exercise program for self-management of symptoms.    Time 4    Period Weeks    Status Achieved  Target Date 07/23/20             PT Long Term Goals - 07/08/20 1713      PT LONG TERM GOAL #1   Title Pt will improve FOTO score to at least 57%    Time 8    Period Weeks    Status On-going      PT LONG TERM GOAL #2   Title She will be able to ambulate community distances no AD  and stairs with less than 3/10 pain    Time 8    Period Weeks    Status On-going      PT LONG TERM GOAL #3   Title Improve Rt knee strength to 5/5 for improved ability to allow patient to complete valued functional tasks such as gardening, and walking dogs with less difficulty.    Time 8    Period Weeks    Status On-going      PT LONG TERM GOAL #4   Title Improve Rt knee ROM 0-130 deg    Time 8    Period Weeks    Status On-going      PT LONG TERM GOAL #5   Title improve 5TSTS test to 12 seconds without UE support    Baseline 14    Time 8    Period Weeks    Status On-going                 Plan - 07/08/20 1714    Clinical Impression Statement AROM is excellent.  Rachel Duran felt quadriceps strengthening activities in the right areas (quadriceps, not joint).  She will benefit from continued balance, proprioceptive and strength work before transfer into independent rehabilitation.    Examination-Activity Limitations Stairs;Stand;Lift;Locomotion Level    Examination-Participation Restrictions Cleaning;Driving;Community Activity;Shop;Laundry    Stability/Clinical Decision Making Stable/Uncomplicated    Rehab Potential Excellent    PT Frequency 2x /  week    PT Duration 8 weeks   6-8   PT Treatment/Interventions ADLs/Self Care Home Management;Cryotherapy;Electrical Stimulation;Iontophoresis 4mg /ml Dexamethasone;Moist Heat;Ultrasound;Gait training;Stair training;Therapeutic activities;Therapeutic exercise;Balance training;Neuromuscular re-education;Manual techniques;Scar mobilization;Passive range of motion;Dry needling;Joint Manipulations;Vasopneumatic Device;Taping    PT Next Visit Plan Rt knee strength, gait, balance    PT Home Exercise Plan Access Code: [email or web address]    Consulted and Agree with Plan of Care Patient           Patient will benefit from skilled therapeutic intervention in order to improve the following deficits and impairments:  Abnormal gait,Decreased activity tolerance,Decreased balance,Decreased mobility,Decreased endurance,Decreased range of motion,Decreased strength,Decreased scar mobility,Difficulty walking,Hypomobility,Increased edema,Impaired flexibility,Increased muscle spasms,Pain  Visit Diagnosis: Stiffness of right knee, not elsewhere classified  Difficulty in walking, not elsewhere classified  Muscle weakness (generalized)     Problem List Patient Active Problem List   Diagnosis Date Noted  . Osteopenia 07/02/2020  . S/P knee surgery 06/01/2020  . Pre-op evaluation 05/27/2020  . Pulmonary nodule 05/27/2020  . 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 Chronic patellofemoral pain of right knee 05/05/2020  . Positive ANA (antinuclear antibody) 06/05/2019  . 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  . Unilateral primary osteoarthritis, left hip 01/28/2019  . Pelvic pain 12/12/2018  . Common bile duct (CBD) obstruction   . Elevated alkaline phosphatase level   . Venous insufficiency of left lower extremity 07/04/2018  . Dyslipidemia 09/22/2017  . DDD (degenerative disc disease), cervical 04/03/2017  . DDD (degenerative disc disease), lumbar 04/03/2017  . Onychomycosis 03/21/2017  .  Dysphagia 10/18/2016  .  Encounter for chronic pain management 10/18/2016  . Biliary stasis 09/26/2016  . Fever blister 08/18/2016  . Urinary urgency 07/05/2016  . HNP (herniated nucleus pulposus), lumbar 09/23/2015  . Sinus congestion 07/30/2015  . Iron deficiency 07/30/2015  . Health maintenance examination 06/15/2015  . 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 Headache 03/09/2015  . Clavicle enlargement 12/13/2014  . Prediabetes 12/13/2014  . Plantar fasciitis, bilateral 09/11/2014  . Advanced care planning/counseling discussion 06/10/2014  . Medicare annual wellness visit, subsequent 06/10/2014  . Abnormal thyroid function test 06/10/2014  . Chronic pain syndrome 06/10/2014  . CFS (chronic fatigue syndrome) 04/08/2014  . Postmenopausal atrophic vaginitis 10/19/2012  . 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 Cervical disc disorder with radiculopathy of cervical region 05/28/2010  . CHEST PAIN UNSPECIFIED 02/28/2008  . APHTHOUS ULCERS 01/31/2008  . Chronic insomnia 11/09/2007  . Drug-induced constipation 10/11/2007  . Allergic rhinitis 04/12/2007  . MDD (major depressive disorder), recurrent episode, moderate ([place]) 03/05/2007  . [name] syndrome 03/05/2007  . GERD 03/05/2007  . ROSACEA 03/05/2007  . NEURALGIA 03/05/2007  . 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 Chronic interstitial cystitis 12/06/2006  . Fibromyalgia 12/06/2006    Farley Ly PT, MPT 07/08/2020, 5:17 PM  [facility] [address] [place], [place], [address] Phone: [phone]   Fax:  [phone]  Name: Rachel Duran MRN: [id number] Date of Birth: Nov 19, 1955

## 2020-07-09 ENCOUNTER — Encounter: Payer: Medicare Other | Admitting: Family Medicine

## 2020-07-10 ENCOUNTER — Other Ambulatory Visit: Payer: Self-pay

## 2020-07-10 ENCOUNTER — Ambulatory Visit (INDEPENDENT_AMBULATORY_CARE_PROVIDER_SITE_OTHER): Payer: Medicare Other | Admitting: Family Medicine

## 2020-07-10 ENCOUNTER — Ambulatory Visit (INDEPENDENT_AMBULATORY_CARE_PROVIDER_SITE_OTHER): Payer: Medicare Other | Admitting: Physical Therapy

## 2020-07-10 ENCOUNTER — Encounter: Payer: Self-pay | Admitting: Family Medicine

## 2020-07-10 VITALS — BP 124/80 | HR 113 | Temp 98.3°F | Ht 64.5 in | Wt 125.1 lb

## 2020-07-10 DIAGNOSIS — M6281 Muscle weakness (generalized): Secondary | ICD-10-CM | POA: Diagnosis not present

## 2020-07-10 DIAGNOSIS — I7300 Raynaud's syndrome without gangrene: Secondary | ICD-10-CM

## 2020-07-10 DIAGNOSIS — F5104 Psychophysiologic insomnia: Secondary | ICD-10-CM

## 2020-07-10 DIAGNOSIS — G894 Chronic pain syndrome: Secondary | ICD-10-CM

## 2020-07-10 DIAGNOSIS — M25561 Pain in right knee: Secondary | ICD-10-CM | POA: Diagnosis not present

## 2020-07-10 DIAGNOSIS — R262 Difficulty in walking, not elsewhere classified: Secondary | ICD-10-CM

## 2020-07-10 DIAGNOSIS — M349 Systemic sclerosis, unspecified: Secondary | ICD-10-CM

## 2020-07-10 DIAGNOSIS — E611 Iron deficiency: Secondary | ICD-10-CM

## 2020-07-10 DIAGNOSIS — M25661 Stiffness of right knee, not elsewhere classified: Secondary | ICD-10-CM | POA: Diagnosis not present

## 2020-07-10 DIAGNOSIS — K219 Gastro-esophageal reflux disease without esophagitis: Secondary | ICD-10-CM

## 2020-07-10 DIAGNOSIS — Z0000 Encounter for general adult medical examination without abnormal findings: Secondary | ICD-10-CM | POA: Diagnosis not present

## 2020-07-10 DIAGNOSIS — R946 Abnormal results of thyroid function studies: Secondary | ICD-10-CM

## 2020-07-10 DIAGNOSIS — R7303 Prediabetes: Secondary | ICD-10-CM

## 2020-07-10 DIAGNOSIS — H3553 Other dystrophies primarily involving the sensory retina: Secondary | ICD-10-CM

## 2020-07-10 DIAGNOSIS — R2689 Other abnormalities of gait and mobility: Secondary | ICD-10-CM

## 2020-07-10 DIAGNOSIS — K5903 Drug induced constipation: Secondary | ICD-10-CM

## 2020-07-10 DIAGNOSIS — E785 Hyperlipidemia, unspecified: Secondary | ICD-10-CM

## 2020-07-10 DIAGNOSIS — M797 Fibromyalgia: Secondary | ICD-10-CM

## 2020-07-10 DIAGNOSIS — Z96642 Presence of left artificial hip joint: Secondary | ICD-10-CM

## 2020-07-10 DIAGNOSIS — F331 Major depressive disorder, recurrent, moderate: Secondary | ICD-10-CM

## 2020-07-10 DIAGNOSIS — R600 Localized edema: Secondary | ICD-10-CM

## 2020-07-10 DIAGNOSIS — K831 Obstruction of bile duct: Secondary | ICD-10-CM

## 2020-07-10 DIAGNOSIS — M85851 Other specified disorders of bone density and structure, right thigh: Secondary | ICD-10-CM

## 2020-07-10 DIAGNOSIS — G8929 Other chronic pain: Secondary | ICD-10-CM

## 2020-07-10 DIAGNOSIS — N3010 Interstitial cystitis (chronic) without hematuria: Secondary | ICD-10-CM

## 2020-07-10 DIAGNOSIS — Z7189 Other specified counseling: Secondary | ICD-10-CM

## 2020-07-10 DIAGNOSIS — Z9889 Other specified postprocedural states: Secondary | ICD-10-CM

## 2020-07-10 NOTE — Patient Instructions (Signed)
Access Code: [facility] URL: [email or web address] Date: 07/10/2020 Prepared by: Elsie Ra  Exercises Walking March - 2 x daily - 6 x weekly - 3 sets - 10 reps Walking with Head Rotation - 2 x daily - 6 x weekly - 3 reps Tandem Walking - 2 x daily - 6 x weekly - 3 sets - 10 reps Knee Extension with Weight Machine - 2 x daily - 6 x weekly - 2-3 sets - 10-20 reps Hamstring Curl with Weight Machine - 2 x daily - 6 x weekly - 2-3 sets - 10-20 reps Full Leg Press - 2 x daily - 6 x weekly - 2-3 sets - 10 reps

## 2020-07-10 NOTE — Progress Notes (Signed)
Patient ID: Rachel Duran, female    DOB: 1955-09-02, 64 y.o.   MRN: [id number]  This visit was conducted in person.  BP 124/80 (BP Location: Left Arm, Patient Position: Sitting, Cuff Size: Normal)   Pulse (!) 113   Temp 98.3 F (36.8 C) (Temporal)   Ht 5' 4.5" (1.638 m)   Wt 125 lb 1 oz (56.7 kg)   LMP 07/25/1998   SpO2 100%   BMI 21.14 kg/m   Pulse Readings from Last 3 Encounters:  07/10/20 (!) 113  06/22/20 (!) 111  06/02/20 80    CC: AMW/CPE Subjective:   HPI: Rachel Duran is a 64 y.o. female presenting on 07/10/2020 for Medicare Wellness   Did not see health advisor this year.    Hearing Screening   125Hz  250Hz  500Hz  1000Hz  2000Hz  3000Hz  4000Hz  6000Hz  8000Hz   Right ear:   20 20 20  20     Left ear:   20 20 20  20     Vision Screening Comments: Last eye exam, 06/2020.  [facility] Visit from 07/10/2020 in [facility] [place]  PHQ-2 Total Score 0      Fall Risk  07/10/2020 07/02/2019 06/28/2018 06/21/2017 06/20/2016  Falls in the past year? 0 1 1 No No  Comment - tripped and fell fell walking down stairs - -  Number falls in past yr: - 0 0 - -  Injury with Fall? 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 Watching choroidal nevus. Last saw last week - significant increase in R eye retina atrophy.   Recent partial R knee replacement with PF arthroplasty (Dr Marlou Sa) 06/01/2020 - remarkable recovery to date - continues PT twice weekly.   Scleroderma sees rheum regularly. Just had PFT this week, pending results. To see Dr Kimber Relic 07/2020. Sees pulm and cards yearly.   Preventative: COLONOSCOPY 02/2018-done for positive cologuard - WNL, rpt 10 yrs [facility]). Well woman with Dr  Josefa Half OBGYN 01/2020.  [facility] 2014 with mild osteopenia per GYN, 04/2019 stable osteopenia (T -2.0).  Flu shot - yearly COVID vaccine Moderna 08/2019, 09/2019, 03/2020 Pneumovax 2011, prevnar 2015 Td 2007, Td 10/2016 Shingrix 06/2017, 10/2017 Advanced care planning - HCPOA scanned into chart 06/2015. HCPOA is husband Liela Rylee. Does not want prolonged life support if terminally ill.  Seat belt use discussed. Sunscreen use discussed. No changing moles. Sees derm.  Non smoker  Alcohol - none  Dentistq6 mo  Eye examyearly  Bowel - chronic constipation - on movantik with good effect - 25mg  daily (12.5mg  not effective).  Bladder - no incontinence, known IC followed by Dr Amalia Hailey doing well on elmiron and PRN pyridium. Hasn't needed recent instillations.   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 Interim medical history since our last visit reviewed. Allergies and medications reviewed and updated. Outpatient Medications Prior to Visit  Medication Sig Dispense Refill  . ALPRAZolam Duanne Moron) 1  MG tablet Take 0.5 mg by mouth at bedtime.  0  . amLODipine (NORVASC) 5 MG tablet Take 1.5 tablets (7.5 mg total) by mouth daily. 45 tablet 2  . amoxicillin (AMOXIL) 500 MG tablet Take 4 tablets (2,000mg ) by mouth 30-60 minutes prior to dental cleaning. 4 tablet 0  . amphetamine-dextroamphetamine (ADDERALL XR) 30 MG 24 hr capsule TAKE 1 CAPSULE BY MOUTH EVERY MORNING 90 capsule 0  . ARIPiprazole (ABILIFY) 5 MG tablet Take 5 mg by mouth daily.    Marland Kitchen aspirin 81 MG chewable tablet Chew 1 tablet (81 mg total) by mouth daily. 30 tablet 0  . calcium-vitamin D (OSCAL WITH D) 500-200 MG-UNIT tablet Take 1 tablet by mouth 2  (two) times daily.    . cyanocobalamin (,VITAMIN B-12,) 1000 MCG/ML injection INJECT 1ML INTO THE MUSCLE EVERY 21 DAYSAS DIRECTED 3 mL 3  . diclofenac sodium (VOLTAREN) 1 % GEL Apply 2-4 grams to affected joint up to 4 times daily. 400 g 4  . Eszopiclone 3 MG TABS TAKE ONE TABLET AT BEDTIME 90 tablet 0  . ferrous sulfate 325 (65 FE) MG tablet Take 325 mg by mouth every Monday, Wednesday, and Friday.     . fluticasone (FLONASE) 50 MCG/ACT nasal spray Place 1 spray into both nostrils daily as needed for allergies. 16 g 5  . lamoTRIgine (LAMICTAL) 100 MG tablet Take 100 mg by mouth daily.    Marland Kitchen LINZESS 290 MCG CAPS capsule TAKE 1 CAPSULE BY MOUTH DAILY 90 capsule 1  . morphine (MSIR) 15 MG tablet TAKE 1 TABLET BY MOUTH THREE TIMES DAILYAS NEEDED FOR MODERATE OR SEVERE PAIN 90 tablet 0  . naloxegol oxalate (MOVANTIK) 25 MG TABS tablet Take 1 tablet (25 mg total) by mouth daily. 90 tablet 1  . NONFORMULARY OR COMPOUNDED ITEM Testosterone propionate 2% in white petrolatum, apply small amount once daily for 5 days per week. 60 each 0  . ondansetron (ZOFRAN) 4 MG tablet TAKE ONE TABLET EVERY EIGHT HOURS AS NEEDED FOR NAUSEA / VOMITING 30 tablet 1  . ondansetron (ZOFRAN-ODT) 4 MG disintegrating tablet TAKE 1 TABLET BY MOUTH EVERY 8 HOURS AS NEEDED FOR NAUSEA OR VOMITING 20 tablet 1  . pantoprazole (PROTONIX) 40 MG tablet TAKE 1 TABLET BY MOUTH TWICE DAILY WITH A MEAL 180 tablet 1  . pentosan polysulfate (ELMIRON) 100 MG capsule Take 200 mg by mouth 2 (two) times daily.     . phenazopyridine (PYRIDIUM) 100 MG tablet Take by mouth as needed.     . polyethylene glycol (MIRALAX / GLYCOLAX) 17 g packet Take 17 g by mouth 3 (three) times daily.    . Prasterone (INTRAROSA) 6.5 MG INST Place 1 suppository vaginally at bedtime. 84 each 3  . pregabalin (LYRICA) 150 MG capsule TAKE 1 CAPSULE BY MOUTH TWICE DAILY 180 capsule 0  . PREVIDENT 5000 BOOSTER PLUS 1.1 % PSTE See admin instructions.    . senna (SENOKOT) 8.6  MG tablet Take 2 tablets by mouth daily as needed.     . SF 5000 PLUS 1.1 % CREA dental cream Place 1 application onto teeth at bedtime.     Marland Kitchen tiZANidine (ZANAFLEX) 2 MG tablet Take 1 tablet (2 mg total) by mouth every 8 (eight) hours as needed. 30 tablet 0  . ursodiol (ACTIGALL) 300 MG capsule Take 1 capsule (300 mg total) by mouth 2 (two) times daily. 180 capsule 3  . naproxen (NAPROSYN) 250 MG tablet Take 1 tablet (250 mg total) by mouth 2 (  two) times daily with a meal. 30 tablet 0   No facility-administered medications prior to visit.     Per HPI unless specifically indicated in ROS section below Review of Systems  Constitutional: Negative for activity change, appetite change, chills, fatigue, fever and unexpected weight change.  HENT: Negative for hearing loss.   Eyes: Negative for visual disturbance.  Respiratory: Negative for cough, chest tightness, shortness of breath and wheezing.   Cardiovascular: Negative for chest pain, palpitations and leg swelling.  Gastrointestinal: Negative for abdominal distention, abdominal pain, blood in stool, constipation, diarrhea, nausea and vomiting.  Genitourinary: Negative for difficulty urinating and hematuria.  Musculoskeletal: Negative for arthralgias, myalgias and neck pain.  Skin: Negative for rash.  Neurological: Positive for dizziness (occasionally with PT sessions). Negative for seizures, syncope and headaches.  Hematological: Negative for adenopathy. Does not bruise/bleed easily.  Psychiatric/Behavioral: Negative for dysphoric mood. The patient is nervous/anxious.    Objective:  BP 124/80 (BP Location: Left Arm, Patient Position: Sitting, Cuff Size: Normal)   Pulse (!) 113   Temp 98.3 F (36.8 C) (Temporal)   Ht 5' 4.5" (1.638 m)   Wt 125 lb 1 oz (56.7 kg)   LMP 07/25/1998   SpO2 100%   BMI 21.14 kg/m   Wt Readings from Last 3 Encounters:  07/10/20 125 lb 1 oz (56.7 kg)  06/22/20 127 lb 6.4 oz (57.8 kg)  06/01/20 127 lb 3.3 oz  (57.7 kg)      Physical Exam Vitals and nursing note reviewed.  Constitutional:      General: She is not in acute distress.    Appearance: Normal appearance. She is well-developed and well-nourished. She is not ill-appearing.  HENT:     Head: Normocephalic and atraumatic.     Right Ear: Hearing, tympanic membrane, ear canal and external ear normal.     Left Ear: Hearing, tympanic membrane, ear canal and external ear normal.     Mouth/Throat:     Mouth: Oropharynx is clear and moist and mucous membranes are normal.     Pharynx: No posterior oropharyngeal edema.  Eyes:     General: No scleral icterus.    Extraocular Movements: Extraocular movements intact and EOM normal.     Conjunctiva/sclera: Conjunctivae normal.     Pupils: Pupils are equal, round, and reactive to light.  Neck:     Thyroid: No thyroid mass or thyromegaly.     Vascular: No carotid bruit.  Cardiovascular:     Rate and Rhythm: Normal rate and regular rhythm.     Pulses: Normal pulses and intact distal pulses.          Radial pulses are 2+ on the right side and 2+ on the left side.     Heart sounds: Normal heart sounds. No murmur heard.   Pulmonary:     Effort: Pulmonary effort is normal. No respiratory distress.     Breath sounds: Normal breath sounds. No wheezing, rhonchi or rales.  Abdominal:     General: Abdomen is flat. Bowel sounds are normal. There is no distension.     Palpations: Abdomen is soft. There is no mass.     Tenderness: There is no abdominal tenderness. There is no guarding or rebound.     Hernia: No hernia is present.  Musculoskeletal:        General: No edema. Normal range of motion.     Cervical back: Normal range of motion and neck supple.     Right lower leg: No  edema.     Left lower leg: No edema.  Lymphadenopathy:     Cervical: No cervical adenopathy.  Skin:    General: Skin is warm and dry.     Findings: No rash.  Neurological:     General: No focal deficit present.     Mental  Status: She is alert and oriented to person, place, and time.     Comments:  CN grossly intact, station and gait intact Recall 3/3  Calculation 5/5 DLROW  Psychiatric:        Mood and Affect: Mood and affect and mood normal.        Behavior: Behavior normal.        Thought Content: Thought content normal.        Judgment: Judgment normal.       Lab Results  Component Value Date   TSH 1.29 05/27/2020    Lab Results  Component Value Date   CREATININE 0.58 05/27/2020   BUN 10 05/27/2020   NA 133 (L) 05/27/2020   K 4.3 05/27/2020   CL 97 05/27/2020   CO2 28 05/27/2020    Lab Results  Component Value Date   WBC 9.9 05/27/2020   HGB 12.8 05/27/2020   HCT 38.3 05/27/2020   MCV 83.6 05/27/2020   PLT 348.0 05/27/2020   Lab Results  Component Value Date   CHOL 186 07/02/2020   HDL 67.50 07/02/2020   LDLCALC 100 (H) 07/02/2020   TRIG 92.0 07/02/2020   CHOLHDL 3 07/02/2020    Lab Results  Component Value Date   HGBA1C 5.7 07/02/2020    Lab Results  Component Value Date   IRON 60 02/03/2020   TIBC 340 12/18/2012   FERRITIN 62.1 02/03/2020   Lab Results  Component Value Date   ALT 14 02/03/2020   AST 20 02/03/2020   ALKPHOS 79 02/03/2020   BILITOT 0.4 02/03/2020    Assessment & Plan:  This visit occurred during the SARS-CoV-2 public health emergency.  Safety protocols were in place, including screening questions prior to the visit, additional usage of staff PPE, and extensive cleaning of exam room while observing appropriate contact time as indicated for disinfecting solutions.   Problem List Items Addressed This Visit    Status post total replacement of left hip   Stargardt's disease    Progressive macular atrophy but overall stable period Baird Cancer- retinologist)      Scleroderma ([place])    Regularly sees rheum, derm, now seeing pulm and cards yearly. Continue to monitor.       S/P knee surgery    Doing well s/p recent surgery. Appreciate ortho care.        [name] syndrome    Continues amlodipine 7.5mg  daily.       Prediabetes    Minimal.       Osteopenia    Continue cal/vit D and regular weight bearing exercise as able.       Medicare annual wellness visit, subsequent - Primary    I have personally reviewed the Medicare Annual Wellness questionnaire and have noted 1. The patient's medical and social history 2. Their use of alcohol, tobacco or illicit drugs 3. Their current medications and supplements 4. The patient's functional ability including ADL's, fall risks, home safety risks and hearing or visual impairment. Cognitive function has been assessed and addressed as indicated.  5. Diet and physical activity 6. Evidence for depression or mood disorders The patients weight, height, BMI have been recorded in the chart.  I have made referrals, counseling and provided education to the patient based on review of the above and I have provided the pt with a written personalized care plan for preventive services. Provider list updated.. See scanned questionairre as needed for further documentation. Reviewed preventative protocols and updated unless pt declined.       MDD (major depressive disorder), recurrent episode, moderate ([place])    Followed by psych - on lamictal, abilify, xanax.       Iron deficiency    Continues oral iron replacement MWF      Health maintenance examination    Preventative protocols reviewed and updated unless pt declined. Discussed healthy diet and lifestyle.       GERD    Continues protonix 40mg  bid      Fibromyalgia    continue lyrica      Encounter for chronic pain management    [place] CSRS reviewed Stable period on MSIR 15mg  2-3 x/day      Dyslipidemia    Chronic, stable off meds  The 10-year ASCVD risk score Mikey Bussing DC Jr., et al., 2013) is: 3.2%   Values used to calculate the score:     Age: 6 years     Sex: Female     Is Non-Hispanic African American: No     Diabetic: No     Tobacco  smoker: No     Systolic Blood Pressure: 694 mmHg     Is BP treated: No     HDL Cholesterol: 67.5 mg/dL     Total Cholesterol: 186 mg/dL       Drug-induced constipation    Stable period with daily movantik. Also uses linzess       Chronic pain syndrome   Chronic interstitial cystitis    Stable quiescent period       Chronic insomnia    Continue eszopiclone (Lunesta) 3mg  nightly      Biliary stasis    S/p cholecystectomy on urosdiol followed by GI.       Advanced care planning/counseling discussion    Advanced care planning - HCPOA scanned into chart 06/2015. HCPOA is husband Lior Cartelli. Does not want prolonged life support if terminally ill.       Abnormal thyroid function test    TSH remains normal.           No orders of the defined types were placed in this encounter.  No orders of the defined types were placed in this encounter.   Patient instructions: Good to see you today.  You are doing well today  Return as needed or in 3 months for follow up visit.  Labs were looking ok today.  Continue current medicines.   Follow up plan: Return in about 3 months (around 10/08/2020), or if symptoms worsen or fail to improve, for follow up visit.  Ria Bush, MD

## 2020-07-10 NOTE — Therapy (Signed)
[facility] Physical Therapy [address] [place], [place], [address] Phone: [phone]   Fax:  [phone]  Physical Therapy Treatment  Patient Details  Name: Rachel Duran MRN: [id number] Date of Birth: 1955/09/18 Referring Provider (PT): Marlou Sa Tonna Corner, MD   Encounter Date: 07/10/2020   PT End of Session - 07/10/20 1416    Visit Number 4    Number of Visits 16    Date for PT Re-Evaluation 08/20/20    PT Start Time 2355    PT Stop Time 1430    PT Time Calculation (min) 45 min    Activity Tolerance Patient tolerated treatment well;No increased pain    Behavior During Therapy WFL for tasks assessed/performed           Past Medical History:  Diagnosis Date  . Abdominal pain last 4 months   and nausea also  . Allergy   . Anemia    history of  . Anxiety   . Bipolar disorder ([place])    atpical bipolar disorder  . 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 C6- C7 -hx. past fusion(bone graft used)  . Cholecystitis   . Chronic pain   . DDD (degenerative disc disease), lumbar 09/2015   dextroscoliosis with multilevel DDD and facet arthrosis most notable for R foraminal disc protrusion L4/5 producing severe R neural foraminal stenosis abutting R L4 nerve root, moderate spinal canal and mild lat recesss and R neural foraminal stenosis L3/4 (MRI)  . Depression    bipolar depression  . Disorders of porphyrin metabolism   . [place]on of finger of left hand 11/10/2016  . Fibromyalgia   . GERD (gastroesophageal reflux disease)   . Headache    occasionally  . Hypertension   . Internal hemorrhoids   . Interstitial cystitis 06-06-12   hx.  . Irritable bowel syndrome   . PONV (postoperative nausea and vomiting)    now uses stomach blockers and no ponv  . 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  Raynauds disease    hx.  . Seronegative arthritis    [name]  . 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 ANTERIOR CERVICAL DECOMP/DISCECTOMY FUSION  2004   C5/6, C6/7  . ANTERIOR CERVICAL DECOMP/DISCECTOMY FUSION  02/2016   C3/4, C4/5 with plating [name])  . AUGMENTATION MAMMAPLASTY Bilateral 03/25/2010  . BREAST ENHANCEMENT SURGERY  2010  . BREAST IMPLANT EXCHANGE  10/2014   exchange saline implants, B mastopexy/capsulorraphy ([facility])  . BUNIONECTOMY Bilateral yrs ago  . [place]   x 1  . CHOLECYSTECTOMY  06/11/2012   Procedure: LAPAROSCOPIC CHOLECYSTECTOMY WITH INTRAOPERATIVE CHOLANGIOGRAM;  Surgeon: [name], MD,FACS;  Location: [facility];  Service: General;  Laterality: N/A;  . COLONOSCOPY  02/2018   done for positive cologuard - WNL, rpt 10 yrs Fuller Plan)  . CYSTOSCOPY    . ERCP  05/22/2012   Procedure: ENDOSCOPIC RETROGRADE CHOLANGIOPANCREATOGRAPHY (ERCP);  Surgeon: [name], MD,FACG;  Location: [facility];  Service: Endoscopy;  Laterality: N/A;  . ERCP N/A 09/17/2013   Procedure: ENDOSCOPIC RETROGRADE CHOLANGIOPANCREATOGRAPHY (ERCP);  Surgeon: [name], MD;  Location: [facility];  Service: Endoscopy;  Laterality: N/A;  . ERCP N/A 09/27/2018  Procedure: ENDOSCOPIC RETROGRADE CHOLANGIOPANCREATOGRAPHY (ERCP);  Surgeon: [name], MD;  Location: [facility];  Service: Endoscopy;  Laterality: N/A;  . ESOPHAGOGASTRODUODENOSCOPY  09/2016   WNL. esophagus dilated [place] Plan)  . HEMORRHOID BANDING  09-23-13   --Dr. Greer Pickerel  . HERNIA REPAIR     inguinal  . HYSTEROSCOPY W/ ENDOMETRIAL ABLATION    . NASAL SINUS SURGERY     x5  . PARTIAL KNEE ARTHROPLASTY Right 06/01/2020   Procedure: Right knee patellofemoral replacement;  Surgeon: [name], MD;  Location: [place];  Service: Orthopedics;  Laterality:  Right;  . REMOVAL OF STONES  09/27/2018   Procedure: REMOVAL OF STONES;  Surgeon: [name], MD;  Location: [facility];  Service: Endoscopy;;  . [name]  09/27/2018   Procedure: SPHINCTEROTOMY;  Surgeon: [name], MD;  Location: [facility];  Service: Endoscopy;;  . TOTAL HIP ARTHROPLASTY Left 03/12/2019   Procedure: LEFT TOTAL HIP ARTHROPLASTY ANTERIOR APPROACH;  [name], [name], MD)  . UPPER GASTROINTESTINAL ENDOSCOPY      There were no vitals filed for this visit.   Subjective Assessment - 07/10/20 1405    Subjective relays not really having pain, but still with stiffness, we[facility] at home    How long can you stand comfortably? she thinks one hour    Patient Stated Goals be able to walk her dogs 2 miles    Pain Score 1     Pain Location Knee    Pain Orientation Right    Pain Onset More than a month ago            [facility] Adult PT Treatment/Exercise - 07/10/20 0001      Neuro Re-ed    Neuro Re-ed Details  march walking, walking with head turns and nods, tandem walking      Knee/Hip Exercises: Stretches   Knee: Self-Stretch Limitations lunge stretch 10 sec X 10 for flexion    Gastroc Stretch Both;3 reps;30 seconds    Gastroc Stretch Limitations slantboard      Knee/Hip Exercises: Aerobic   Recumbent Bike seat 5 8 min L3-2      Knee/Hip Exercises: Machines for Strengthening   Cybex Knee Extension 10# slow eccentrics X10 then 5# X 10    Cybex Knee Flexion 15 lbs 2 sets of 10    Cybex Leg Press 25# bilat push X 10 slow eccentrics then Rt leg only 25# X 10                    PT Short Term Goals - 07/08/20 1713      PT SHORT TERM GOAL #1   Title Be independent with initial home exercise program for self-management of symptoms.    Time 4    Period Weeks    Status Achieved    Target Date 07/23/20             PT Long Term Goals - 07/08/20 1713      PT LONG TERM GOAL #1   Title Pt will improve FOTO score to at least 57%     Time 8    Period Weeks    Status On-going      PT LONG TERM GOAL #2   Title She will be able to ambulate community distances no AD  and stairs with less than 3/10 pain    Time 8    Period Weeks    Status On-going  PT LONG TERM GOAL #3   Title Improve Rt knee strength to 5/5 for improved ability to allow patient to complete valued functional tasks such as gardening, and walking dogs with less difficulty.    Time 8    Period Weeks    Status On-going      PT LONG TERM GOAL #4   Title Improve Rt knee ROM 0-130 deg    Time 8    Period Weeks    Status On-going      PT LONG TERM GOAL #5   Title improve 5TSTS test to 12 seconds without UE support    Baseline 14    Time 8    Period Weeks    Status On-going                 Plan - 07/10/20 1413    Clinical Impression Statement She is doing great post op, worked more on strength and dynamic balance to wean her from [facility] and gave her print out of dynamic balance/gait exercises and machines she can start doing at the fitness center where she lives. Continue POC    Examination-Activity Limitations Stairs;Stand;Lift;Locomotion Level    Examination-Participation Restrictions Cleaning;Driving;Community Activity;Shop;Laundry    Stability/Clinical Decision Making Stable/Uncomplicated    Rehab Potential Excellent    PT Frequency 2x / week    PT Duration 8 weeks   6-8   PT Treatment/Interventions ADLs/Self Care Home Management;Cryotherapy;Electrical Stimulation;Iontophoresis 4mg /ml Dexamethasone;Moist Heat;Ultrasound;Gait training;Stair training;Therapeutic activities;Therapeutic exercise;Balance training;Neuromuscular re-education;Manual techniques;Scar mobilization;Passive range of motion;Dry needling;Joint Manipulations;Vasopneumatic Device;Taping    PT Next Visit Plan Rt knee strength, gait, balance    PT Home Exercise Plan Access Code: [email or web address](balance and weight machines)    Consulted and Agree with Plan of Care  Patient           Patient will benefit from skilled therapeutic intervention in order to improve the following deficits and impairments:  Abnormal gait,Decreased activity tolerance,Decreased balance,Decreased mobility,Decreased endurance,Decreased range of motion,Decreased strength,Decreased scar mobility,Difficulty walking,Hypomobility,Increased edema,Impaired flexibility,Increased muscle spasms,Pain  Visit Diagnosis: Difficulty in walking, not elsewhere classified  Muscle weakness (generalized)  Stiffness of right knee, not elsewhere classified  Acute pain of right knee  Other abnormalities of gait and mobility  Localized edema     Problem List Patient Active Problem List   Diagnosis Date Noted  . Osteopenia 07/02/2020  . S/P knee surgery 06/01/2020  . Pre-op evaluation 05/27/2020  . Pulmonary nodule 05/27/2020  . 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[place]) 05/05/2020  . Chronic patellofemoral pain of right knee 05/05/2020  . Positive ANA (antinuclear antibody) 06/05/2019  . 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  . Unilateral primary osteoarthritis, left hip 01/28/2019  . Pelvic pain 12/12/2018  . Common bile duct (CBD) obstruction   . Elevated alkaline phosphatase level   . Venous insufficiency of left lower extremity 07/04/2018  . Dyslipidemia 09/22/2017  . DDD (degenerative disc disease), cervical 04/03/2017  . DDD (degenerative disc disease), lumbar 04/03/2017  . Onychomycosis 03/21/2017  . Dysphagia 10/18/2016  . Encounter for chronic pain management 10/18/2016  . Biliary stasis 09/26/2016  . Fever blister 08/18/2016  . Urinary urgency 07/05/2016  . HNP (herniated nucleus pulposus), lumbar 09/23/2015  . Sinus congestion 07/30/2015  . Iron deficiency 07/30/2015  . Health maintenance examination 06/15/2015  . 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 Headache 03/09/2015  . Clavicle enlargement 12/13/2014  . Prediabetes 12/13/2014  . Plantar fasciitis,  bilateral 09/11/2014  . Advanced care planning/counseling discussion 06/10/2014  . Medicare annual  wellness visit, subsequent 06/10/2014  . Abnormal thyroid function test 06/10/2014  . Chronic pain syndrome 06/10/2014  . CFS (chronic fatigue syndrome) 04/08/2014  . Postmenopausal atrophic vaginitis 10/19/2012  . 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 Cervical disc disorder with radiculopathy of cervical region 05/28/2010  . CHEST PAIN UNSPECIFIED 02/28/2008  . APHTHOUS ULCERS 01/31/2008  . Chronic insomnia 11/09/2007  . Drug-induced constipation 10/11/2007  . Allergic rhinitis 04/12/2007  . MDD (major depressive disorder), recurrent episode, moderate ([place]) 03/05/2007  . [name] syndrome 03/05/2007  . GERD 03/05/2007  . ROSACEA 03/05/2007  . NEURALGIA 03/05/2007  . 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 Chronic interstitial cystitis 12/06/2006  . Fibromyalgia 12/06/2006    Silvestre Mesi 07/10/2020, 2:38 PM  [facility] Physical Therapy [address] [place], [place], [address] Phone: [phone]   Fax:  [phone]  Name: Supriya Beaston MRN: [id number] Date of Birth: 02/12/1956

## 2020-07-10 NOTE — Patient Instructions (Addendum)
Good to see you today.  You are doing well today  Return as needed or in 3 months for follow up visit.  Labs were looking ok today.  Continue current medicines.   Health Maintenance for Postmenopausal Women Menopause is a normal process in which your ability to get pregnant comes to an end. This process happens slowly over many months or years, usually between the ages of 61 and 55. Menopause is complete when you have missed your menstrual periods for 12 months. It is important to talk with your health care provider about some of the most common conditions that affect women after menopause (postmenopausal women). These include heart disease, cancer, and bone loss (osteoporosis). Adopting a healthy lifestyle and getting preventive care can help to promote your health and wellness. The actions you take can also lower your chances of developing some of these common conditions. What should I know about menopause? During menopause, you may get a number of symptoms, such as:  Hot flashes. These can be moderate or severe.  Night sweats.  Decrease in sex drive.  Mood swings.  Headaches.  Tiredness.  Irritability.  Memory problems.  Insomnia. Choosing to treat or not to treat these symptoms is a decision that you make with your health care provider. Do I need hormone replacement therapy?  Hormone replacement therapy is effective in treating symptoms that are caused by menopause, such as hot flashes and night sweats.  Hormone replacement carries certain risks, especially as you become older. If you are thinking about using estrogen or estrogen with progestin, discuss the benefits and risks with your health care provider. What is my risk for heart disease and stroke? The risk of heart disease, heart attack, and stroke increases as you age. One of the causes may be a change in the body's hormones during menopause. This can affect how your body uses dietary fats, triglycerides, and  cholesterol. Heart attack and stroke are medical emergencies. There are many things that you can do to help prevent heart disease and stroke. Watch your blood pressure  High blood pressure causes heart disease and increases the risk of stroke. This is more likely to develop in people who have high blood pressure readings, are of African descent, or are overweight.  Have your blood pressure checked: ? Every 3-5 years if you are 54-5 years of age. ? Every year if you are 26 years old or older. Eat a healthy diet   Eat a diet that includes plenty of vegetables, fruits, low-fat dairy products, and lean protein.  Do not eat a lot of foods that are high in solid fats, added sugars, or sodium. Get regular exercise Get regular exercise. This is one of the most important things you can do for your health. Most adults should:  Try to exercise for at least 150 minutes each week. The exercise should increase your heart rate and make you sweat (moderate-intensity exercise).  Try to do strengthening exercises at least twice each week. Do these in addition to the moderate-intensity exercise.  Spend less time sitting. Even light physical activity can be beneficial. Other tips  Work with your health care provider to achieve or maintain a healthy weight.  Do not use any products that contain nicotine or tobacco, such as cigarettes, e-cigarettes, and chewing tobacco. If you need help quitting, ask your health care provider.  Know your numbers. Ask your health care provider to check your cholesterol and your blood sugar (glucose). Continue to have your blood tested  as directed by your health care provider. Do I need screening for cancer? Depending on your health history and family history, you may need to have cancer screening at different stages of your life. This may include screening for:  Breast cancer.  Cervical cancer.  Lung cancer.  Colorectal cancer. What is my risk for  osteoporosis? After menopause, you may be at increased risk for osteoporosis. Osteoporosis is a condition in which bone destruction happens more quickly than new bone creation. To help prevent osteoporosis or the bone fractures that can happen because of osteoporosis, you may take the following actions:  If you are 7-73 years old, get at least 1,000 mg of calcium and at least 600 mg of vitamin D per day.  If you are older than age 62 but younger than age 61, get at least 1,200 mg of calcium and at least 600 mg of vitamin D per day.  If you are older than age 22, get at least 1,200 mg of calcium and at least 800 mg of vitamin D per day. Smoking and drinking excessive alcohol increase the risk of osteoporosis. Eat foods that are rich in calcium and vitamin D, and do weight-bearing exercises several times each week as directed by your health care provider. How does menopause affect my mental health? Depression may occur at any age, but it is more common as you become older. Common symptoms of depression include:  Low or sad mood.  Changes in sleep patterns.  Changes in appetite or eating patterns.  Feeling an overall lack of motivation or enjoyment of activities that you previously enjoyed.  Frequent crying spells. Talk with your health care provider if you think that you are experiencing depression. General instructions See your health care provider for regular wellness exams and vaccines. This may include:  Scheduling regular health, dental, and eye exams.  Getting and maintaining your vaccines. These include: ? Influenza vaccine. Get this vaccine each year before the flu season begins. ? Pneumonia vaccine. ? Shingles vaccine. ? Tetanus, diphtheria, and pertussis (Tdap) booster vaccine. Your health care provider may also recommend other immunizations. Tell your health care provider if you have ever been abused or do not feel safe at home. Summary  Menopause is a normal process in  which your ability to get pregnant comes to an end.  This condition causes hot flashes, night sweats, decreased interest in sex, mood swings, headaches, or lack of sleep.  Treatment for this condition may include hormone replacement therapy.  Take actions to keep yourself healthy, including exercising regularly, eating a healthy diet, watching your weight, and checking your blood pressure and blood sugar levels.  Get screened for cancer and depression. Make sure that you are up to date with all your vaccines. This information is not intended to replace advice given to you by your health care provider. Make sure you discuss any questions you have with your health care provider. Document Revised: 07/04/2018 Document Reviewed: 07/04/2018 Elsevier Patient Education  2020 [employer].

## 2020-07-11 ENCOUNTER — Encounter: Payer: Self-pay | Admitting: Orthopedic Surgery

## 2020-07-11 NOTE — Assessment & Plan Note (Signed)
Progressive macular atrophy but overall stable period [facility]- retinologist)

## 2020-07-11 NOTE — Assessment & Plan Note (Signed)
Advanced care planning - HCPOA scanned into chart 06/2015. HCPOA is husband Rachel Duran. Does not want prolonged life support if terminally ill.  ?

## 2020-07-11 NOTE — Assessment & Plan Note (Signed)
Preventative protocols reviewed and updated unless pt declined. Discussed healthy diet and lifestyle.  

## 2020-07-11 NOTE — Assessment & Plan Note (Signed)
Chronic, stable off meds  The 10-year ASCVD risk score Rachel Bussing DC Jr., et al., 2013) is: 3.2%   Values used to calculate the score:     Age: 64 years     Sex: Female     Is Non-Hispanic African American: No     Diabetic: No     Tobacco smoker: No     Systolic Blood Pressure: 630 mmHg     Is BP treated: No     HDL Cholesterol: 67.5 mg/dL     Total Cholesterol: 186 mg/dL

## 2020-07-11 NOTE — Assessment & Plan Note (Signed)
continue lyrica

## 2020-07-11 NOTE — Assessment & Plan Note (Addendum)
Continue eszopiclone (Lunesta) 3mg  nightly

## 2020-07-11 NOTE — Assessment & Plan Note (Signed)
S/p cholecystectomy on urosdiol followed by GI.

## 2020-07-11 NOTE — Assessment & Plan Note (Addendum)
Stable period with daily movantik. Also uses linzess

## 2020-07-11 NOTE — Assessment & Plan Note (Signed)
Doing well s/p recent surgery. Appreciate ortho care.

## 2020-07-11 NOTE — Assessment & Plan Note (Signed)
Regularly sees rheum, derm, now seeing pulm and cards yearly. Continue to monitor.

## 2020-07-11 NOTE — Progress Notes (Signed)
Post-Op Visit Note   Patient: Rachel Duran           Date of Birth: 05-16-1956           MRN: [id number] Visit Date: 07/08/2020 PCP: Ria Bush, MD   Assessment & Plan:  Chief Complaint:  Chief Complaint  Patient presents with  . Right Knee - Routine Post Op   Visit Diagnoses:  1. Chondromalacia patellae, right knee     Plan: Patient is a 64 year old female presents s/p right knee patellofemoral replacement on 06/01/2020.  She states that she is doing well continues to progress.  She is ambulating without any assistance device around the house but she is ambulating with a cane when out.  She feels that she could probably go without the cane but she is a little apprehensive about walking without any assistance while she is out and about.  She is going to physical therapy 2 times per week up stairs which is ending in 2 weeks.  She does not have to take any pain medication.  On the other days when she does not go to physical therapy, she does home exercise program for 1 hour proximately.  On exam she has a healing incision without any evidence of dehiscence or infection.  She has about 3 degrees of extension and greater than 120 degrees of flexion.  Patella is mobile without any gross instability.  No calf tenderness.  Negative Homans' sign.  Plan to start exercise bike and side lifts with follow-up as needed.  Follow-Up Instructions: Return if symptoms worsen or fail to improve.   Orders:  No orders of the defined types were placed in this encounter.  No orders of the defined types were placed in this encounter.   Imaging: No results found.  PMFS History: Patient Active Problem List   Diagnosis Date Noted  . Osteopenia 07/02/2020  . S/P knee surgery 06/01/2020  . Pre-op evaluation 05/27/2020  . Pulmonary nodule 05/27/2020  . Scleroderma ([place]) 05/05/2020  . Chronic patellofemoral pain of right knee 05/05/2020  . Positive ANA (antinuclear antibody) 06/05/2019  .  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  . Unilateral primary osteoarthritis, left hip 01/28/2019  . Pelvic pain 12/12/2018  . Common bile duct (CBD) obstruction   . Venous insufficiency of left lower extremity 07/04/2018  . Dyslipidemia 09/22/2017  . DDD (degenerative disc disease), cervical 04/03/2017  . DDD (degenerative disc disease), lumbar 04/03/2017  . Onychomycosis 03/21/2017  . Dysphagia 10/18/2016  . Encounter for chronic pain management 10/18/2016  . Biliary stasis 09/26/2016  . Fever blister 08/18/2016  . HNP (herniated nucleus pulposus), lumbar 09/23/2015  . Sinus congestion 07/30/2015  . Iron deficiency 07/30/2015  . Health maintenance examination 06/15/2015  . 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 Headache 03/09/2015  . Clavicle enlargement 12/13/2014  . Prediabetes 12/13/2014  . Plantar fasciitis, bilateral 09/11/2014  . Advanced care planning/counseling discussion 06/10/2014  . Medicare annual wellness visit, subsequent 06/10/2014  . Abnormal thyroid function test 06/10/2014  . Chronic pain syndrome 06/10/2014  . CFS (chronic fatigue syndrome) 04/08/2014  . Postmenopausal atrophic vaginitis 10/19/2012  . 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 Cervical disc disorder with radiculopathy of cervical region 05/28/2010  . CHEST PAIN UNSPECIFIED 02/28/2008  . APHTHOUS ULCERS 01/31/2008  . Chronic insomnia 11/09/2007  . Drug-induced constipation 10/11/2007  . Allergic rhinitis 04/12/2007  . MDD (major depressive disorder), recurrent episode, moderate ([place]) 03/05/2007  . [name] syndrome 03/05/2007  . GERD 03/05/2007  .  ROSACEA 03/05/2007  . NEURALGIA 03/05/2007  . 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 Chronic interstitial cystitis 12/06/2006  . Fibromyalgia 12/06/2006   Past Medical History:  Diagnosis Date  . Abdominal pain last 4 months   and nausea also  . Allergy   . Anemia    history of  . Anxiety   . Bipolar  disorder ([place])    atpical bipolar disorder  . 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 C6- C7 -hx. past fusion(bone graft used)  . Cholecystitis   . Chronic pain   . DDD (degenerative disc disease), lumbar 09/2015   dextroscoliosis with multilevel DDD and facet arthrosis most notable for R foraminal disc protrusion L4/5 producing severe R neural foraminal stenosis abutting R L4 nerve root, moderate spinal canal and mild lat recesss and R neural foraminal stenosis L3/4 (MRI)  . Depression    bipolar depression  . Disorders of porphyrin metabolism   . Felon of finger of left hand 11/10/2016  . Fibromyalgia   . GERD (gastroesophageal reflux disease)   . Headache    occasionally  . Hypertension   . Internal hemorrhoids   . Interstitial cystitis 06-06-12   hx.  . Irritable bowel syndrome   . PONV (postoperative nausea and vomiting)    now uses stomach blockers and no ponv  . 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   Evaluated in Pulmonary clinic/ [facility]/ [place] /  02/07/12 > referred to Health Dept 02/10/2012     - POS GOLD    01/31/2012    . Raynauds disease    hx.  . Seronegative arthritis    [name]  . 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 CAD Father 73       MI, nonsmoker  . Esophageal cancer Father 51  . Stomach cancer Father   . Scleroderma Mother   . Hypertension Mother   . Esophageal cancer Paternal Grandfather   . Stomach cancer Paternal Grandfather   . Diabetes Maternal Grandmother   . Arthritis Brother   . Stroke Neg Hx   . Colon cancer Neg Hx   . Rectal cancer Neg Hx     Past Surgical History:  Procedure Laterality Date  . ANTERIOR CERVICAL DECOMP/DISCECTOMY FUSION  2004   C5/6, C6/7  . ANTERIOR CERVICAL DECOMP/DISCECTOMY FUSION  02/2016   C3/4, C4/5 with plating [name])  . AUGMENTATION MAMMAPLASTY Bilateral 03/25/2010  . BREAST  ENHANCEMENT SURGERY  2010  . BREAST IMPLANT EXCHANGE  10/2014   exchange saline implants, B mastopexy/capsulorraphy ([facility])  . BUNIONECTOMY Bilateral yrs ago  . [place]   x 1  . CHOLECYSTECTOMY  06/11/2012   Procedure: LAPAROSCOPIC CHOLECYSTECTOMY WITH INTRAOPERATIVE CHOLANGIOGRAM;  Surgeon: [name], MD,FACS;  [facility];  Service: General;  Laterality: N/A;  . COLONOSCOPY  02/2018   done for positive cologuard - WNL, rpt 10 yrs Fuller Plan)  . CYSTOSCOPY    . ERCP  05/22/2012   Procedure: ENDOSCOPIC RETROGRADE CHOLANGIOPANCREATOGRAPHY (ERCP);  Surgeon: [name], MD,FACG;  Location: [facility];  Service: Endoscopy;  Laterality: N/A;  . ERCP N/A 09/17/2013   Procedure: ENDOSCOPIC RETROGRADE CHOLANGIOPANCREATOGRAPHY (ERCP);  Surgeon: [name], MD;  Location: [facility];  Service: Endoscopy;  Laterality: N/A;  . ERCP N/A 09/27/2018   Procedure: ENDOSCOPIC RETROGRADE CHOLANGIOPANCREATOGRAPHY (ERCP);  Surgeon: [name], MD;  [facility];  Service: Endoscopy;  Laterality: N/A;  . ESOPHAGOGASTRODUODENOSCOPY  09/2016   WNL. esophagus dilated Fuller Plan)  . HEMORRHOID BANDING  09-23-13   --Dr. Greer Pickerel  . HERNIA REPAIR     inguinal  . HYSTEROSCOPY W/ ENDOMETRIAL ABLATION    . NASAL SINUS SURGERY     x5  . PARTIAL KNEE ARTHROPLASTY Right 06/01/2020   Procedure: Right knee patellofemoral replacement;  Surgeon: [name], MD;  Location: [facility];  Service: Orthopedics;  Laterality: Right;  . REMOVAL OF STONES  09/27/2018   Procedure: REMOVAL OF STONES;  Surgeon: [name], MD;  [facility];  Service: Endoscopy;;  . [name]  09/27/2018   Procedure: SPHINCTEROTOMY;  Surgeon: [name], MD;  [facility];  Service: Endoscopy;;  . TOTAL HIP ARTHROPLASTY Left 03/12/2019   Procedure: LEFT TOTAL HIP ARTHROPLASTY ANTERIOR APPROACH;  [name], [name], MD)  . UPPER  GASTROINTESTINAL ENDOSCOPY     Social History   Occupational History  . Occupation: retired    Fish farm manager: UNEMPLOYED  Tobacco Use  . Smoking status: Never Smoker  . Smokeless tobacco: Never Used  Vaping Use  . Vaping Use: Never used  Substance and Sexual Activity  . Alcohol use: No    Alcohol/week: 0.0 standard drinks  . Drug use: No  . Sexual activity: Yes    Partners: Male    Birth control/protection: Post-menopausal    Comment: vasectomy

## 2020-07-11 NOTE — Assessment & Plan Note (Addendum)
Continues oral iron replacement MWF

## 2020-07-11 NOTE — Assessment & Plan Note (Signed)
Followed by psych - on lamictal, abilify, xanax.

## 2020-07-11 NOTE — Assessment & Plan Note (Signed)
Minimal

## 2020-07-11 NOTE — Assessment & Plan Note (Signed)
Continues amlodipine 7.5mg  daily.

## 2020-07-11 NOTE — Assessment & Plan Note (Signed)

## 2020-07-11 NOTE — Assessment & Plan Note (Signed)
TSH remains normal.

## 2020-07-11 NOTE — Assessment & Plan Note (Signed)
Continues protonix 40mg  bid

## 2020-07-11 NOTE — Assessment & Plan Note (Addendum)
Continue cal/vit D and regular weight bearing exercise as able.

## 2020-07-11 NOTE — Assessment & Plan Note (Signed)
Stable quiescent period

## 2020-07-11 NOTE — Assessment & Plan Note (Signed)
[place] CSRS reviewed.  Stable period on MSIR 15mg 2-3 x/day 

## 2020-07-12 ENCOUNTER — Encounter: Payer: Self-pay | Admitting: Orthopedic Surgery

## 2020-07-13 ENCOUNTER — Encounter: Payer: Medicare Other | Admitting: Physical Therapy

## 2020-07-13 ENCOUNTER — Other Ambulatory Visit: Payer: Self-pay

## 2020-07-13 ENCOUNTER — Other Ambulatory Visit: Payer: Self-pay | Admitting: Physician Assistant

## 2020-07-13 NOTE — Telephone Encounter (Signed)
Last Visit:04/21/2020 Next Visit:09/17/2020  Okay to refill per Dr. Estanislado Pandy

## 2020-07-13 NOTE — Telephone Encounter (Signed)
Patient is calling for refill of Testosterone cream to [facility] in [place]. Patient would also like a callback from nurse to confirm.

## 2020-07-13 NOTE — Telephone Encounter (Signed)
Spoke with pt. Pt states calling to get Rx testosterone cream. Pt states has been taking expired Rx as prescribed. Pt states "tub of testosterone cream they give you only has shelf life of 2 months" Current Rx expired 3 weeks ago.   Pt states [facility] had reached out to refill Rx but has not heard anything back from our office. Pt states is suppose to use Rx until next AEX in 01/2021.  Advised will review with Dr Quincy Simmonds and return call. Pt thankful for call back.   Routing to Dr Quincy Simmonds, please advise   Rx pended for refill # 80, ?RF

## 2020-07-14 ENCOUNTER — Encounter: Payer: Self-pay | Admitting: Family Medicine

## 2020-07-14 MED ORDER — NONFORMULARY OR COMPOUNDED ITEM
0 refills | Status: DC
Start: 2020-07-14 — End: 2021-01-26

## 2020-07-15 ENCOUNTER — Encounter: Payer: Medicare Other | Admitting: Physical Therapy

## 2020-07-16 ENCOUNTER — Encounter: Payer: Self-pay | Admitting: Family Medicine

## 2020-07-16 NOTE — Telephone Encounter (Signed)
Had to resubmit PA; key:  [id number], PA case ID:  [id number].  Decision pending.

## 2020-07-20 ENCOUNTER — Telehealth: Payer: Self-pay | Admitting: Pulmonary Disease

## 2020-07-20 ENCOUNTER — Telehealth: Payer: Self-pay | Admitting: Radiology

## 2020-07-20 ENCOUNTER — Ambulatory Visit (INDEPENDENT_AMBULATORY_CARE_PROVIDER_SITE_OTHER): Payer: Medicare Other | Admitting: Physical Therapy

## 2020-07-20 ENCOUNTER — Other Ambulatory Visit: Payer: Self-pay

## 2020-07-20 ENCOUNTER — Encounter: Payer: Self-pay | Admitting: *Deleted

## 2020-07-20 ENCOUNTER — Other Ambulatory Visit: Payer: Self-pay | Admitting: Family Medicine

## 2020-07-20 DIAGNOSIS — R2689 Other abnormalities of gait and mobility: Secondary | ICD-10-CM

## 2020-07-20 DIAGNOSIS — M25561 Pain in right knee: Secondary | ICD-10-CM | POA: Diagnosis not present

## 2020-07-20 DIAGNOSIS — R262 Difficulty in walking, not elsewhere classified: Secondary | ICD-10-CM | POA: Diagnosis not present

## 2020-07-20 DIAGNOSIS — R600 Localized edema: Secondary | ICD-10-CM

## 2020-07-20 DIAGNOSIS — M6281 Muscle weakness (generalized): Secondary | ICD-10-CM | POA: Diagnosis not present

## 2020-07-20 DIAGNOSIS — M25661 Stiffness of right knee, not elsewhere classified: Secondary | ICD-10-CM | POA: Diagnosis not present

## 2020-07-20 MED ORDER — TIZANIDINE HCL 2 MG PO TABS
2.0000 mg | ORAL_TABLET | Freq: Three times a day (TID) | ORAL | 0 refills | Status: DC | PRN
Start: 2020-07-20 — End: 2021-02-05

## 2020-07-20 NOTE — Therapy (Signed)
[facility] [address] [place], [place], [address] Phone: [phone]   Fax:  [phone]  Physical Therapy Treatment  Patient Details  Name: Rachel Duran MRN: [id number] Date of Birth: 24-Dec-1955 Referring Provider (PT): August Saucer Corrie Mckusick, MD   Encounter Date: 07/20/2020   PT End of Session - 07/20/20 1535    Visit Number 5    Number of Visits 16    Date for PT Re-Evaluation 08/20/20    PT Start Time 1430    PT Stop Time 1518    PT Time Calculation (min) 48 min    Activity Tolerance Patient tolerated treatment well;No increased pain    Behavior During Therapy WFL for tasks assessed/performed           Past Medical History:  Diagnosis Date  . Abdominal pain last 4 months   and nausea also  . Allergy   . Anemia    history of  . Anxiety   . Bipolar disorder ([facility])    atpical bipolar disorder  . 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 C6- C7 -hx. past fusion(bone graft used)  . Cholecystitis   . Chronic pain   . DDD (degenerative disc disease), lumbar 09/2015   dextroscoliosis with multilevel DDD and facet arthrosis most notable for R foraminal disc protrusion L4/5 producing severe R neural foraminal stenosis abutting R L4 nerve root, moderate spinal canal and mild lat recesss and R neural foraminal stenosis L3/4 (MRI)  . Depression    bipolar depression  . Disorders of porphyrin metabolism   . Felon of finger of left hand 11/10/2016  . Fibromyalgia   . GERD (gastroesophageal reflux disease)   . Headache    occasionally  . Hypertension   . Internal hemorrhoids   . Interstitial cystitis 06-06-12   hx.  . Irritable bowel syndrome   . PONV (postoperative nausea and vomiting)    now uses stomach blockers and no ponv  . 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   Evaluated in Pulmonary clinic/ [facility]/ [place] /  02/07/12 > referred to Health Dept 02/10/2012     - POS GOLD    01/31/2012    .  Raynauds disease    hx.  . Seronegative arthritis    [name]  . 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 ANTERIOR CERVICAL DECOMP/DISCECTOMY FUSION  2004   C5/6, C6/7  . ANTERIOR CERVICAL DECOMP/DISCECTOMY FUSION  02/2016   C3/4, C4/5 with plating Lovell Sheehan)  . AUGMENTATION MAMMAPLASTY Bilateral 03/25/2010  . BREAST ENHANCEMENT SURGERY  2010  . BREAST IMPLANT EXCHANGE  10/2014   exchange saline implants, B mastopexy/capsulorraphy ([facility])  . BUNIONECTOMY Bilateral yrs ago  . CESAREAN SECTION  1985   x 1  . CHOLECYSTECTOMY  06/11/2012   Procedure: LAPAROSCOPIC CHOLECYSTECTOMY WITH INTRAOPERATIVE CHOLANGIOGRAM;  Surgeon: Atilano Ina, MD,FACS;  [facility];  Service: General;  Laterality: N/A;  . COLONOSCOPY  02/2018   done for positive cologuard - WNL, rpt 10 yrs Russella Dar)  . CYSTOSCOPY    . ERCP  05/22/2012   Procedure: ENDOSCOPIC RETROGRADE CHOLANGIOPANCREATOGRAPHY (ERCP);  Surgeon: Meryl Dare, MD,FACG;  Location: [facility];  Service: Endoscopy;  Laterality: N/A;  . ERCP N/A 09/17/2013   Procedure: ENDOSCOPIC RETROGRADE CHOLANGIOPANCREATOGRAPHY (ERCP);  Surgeon: Meryl Dare, MD;  Location: [facility];  Service: Endoscopy;  Laterality: N/A;  . ERCP N/A 09/27/2018  Procedure: ENDOSCOPIC RETROGRADE CHOLANGIOPANCREATOGRAPHY (ERCP);  Surgeon: Meryl Dare, MD;  Location: [facility];  Service: Endoscopy;  Laterality: N/A;  . ESOPHAGOGASTRODUODENOSCOPY  09/2016   WNL. esophagus dilated Russella Dar)  . HEMORRHOID BANDING  09-23-13   --Dr. Gaynelle Adu  . HERNIA REPAIR     inguinal  . HYSTEROSCOPY W/ ENDOMETRIAL ABLATION    . NASAL SINUS SURGERY     x5  . PARTIAL KNEE ARTHROPLASTY Right 06/01/2020   Procedure: Right knee patellofemoral replacement;  Surgeon: Cammy Copa, MD;  Location: [facility];  Service: Orthopedics;  Laterality:  Right;  . REMOVAL OF STONES  09/27/2018   Procedure: REMOVAL OF STONES;  Surgeon: Meryl Dare, MD;  [facility];  Service: Endoscopy;;  . Dennison Mascot  09/27/2018   Procedure: SPHINCTEROTOMY;  Surgeon: Meryl Dare, MD;  [facility];  Service: Endoscopy;;  . TOTAL HIP ARTHROPLASTY Left 03/12/2019   Procedure: LEFT TOTAL HIP ARTHROPLASTY ANTERIOR APPROACH;  Magnus Ivan, Vanita Panda, MD)  . UPPER GASTROINTESTINAL ENDOSCOPY      There were no vitals filed for this visit.   Subjective Assessment - 07/20/20 1427    Subjective relays she no longer needs SPC, has been doing HEP. She does relay some pain in posterior knee if she stretches it or if she marches her knee up then straightens back out.    How long can you stand comfortably? she thinks one hour    Patient Stated Goals be able to walk her dogs 2 miles    Pain Onset More than a month ago              [facility] PT Assessment - 07/20/20 0001      Assessment   Medical Diagnosis Right knee patellofemoral replacement on 06/01/20.     Referring Provider (PT) Cammy Copa, MD    Onset Date/Surgical Date 08/20/20      AROM   Right Knee Extension -1    Right Knee Flexion 130      Strength   Overall Strength Comments tested in sitting    Right Hip Flexion 4+/5    Right Hip ABduction 4+/5    Right Knee Flexion 4+/5    Right Knee Extension 4+/5            [facility] Adult PT Treatment/Exercise - 07/20/20 0001      Knee/Hip Exercises: Stretches   Active Hamstring Stretch Right;3 reps;30 seconds    Gastroc Stretch Right;3 reps;30 seconds      Knee/Hip Exercises: Aerobic   Recumbent Bike seat 5, L2 10 min      Knee/Hip Exercises: Standing   Heel Raises Limitations heel and toe raises X 15 bilat    Other Standing Knee Exercises resisted walking fwd, lateral, and retro, then had her resist PT while walking to simulate her goal of walking her dogs, started with fixed cadence then varried cadence with  this      Knee/Hip Exercises: Seated   Sit to Sand 2 sets;10 reps;without UE support      Manual Therapy   Manual therapy comments STM and percussion gun to posterior knee, hamstring and gastoc                    PT Short Term Goals - 07/08/20 1713      PT SHORT TERM GOAL #1   Title Be independent with initial home exercise program for self-management of symptoms.    Time 4  Period Weeks    Status Achieved    Target Date 07/23/20             PT Long Term Goals - 07/20/20 1540      PT LONG TERM GOAL #1   Title Pt will improve FOTO score to at least 57%    Time 8    Period Weeks    Status On-going      PT LONG TERM GOAL #2   Title She will be able to ambulate community distances no AD  and stairs with less than 3/10 pain    Baseline can ambulate community distances, need to check stairs.    Time 8    Period Weeks    Status On-going      PT LONG TERM GOAL #3   Title Improve Rt knee strength to 5/5 for improved ability to allow patient to complete valued functional tasks such as gardening, and walking dogs with less difficulty.    Baseline now 4+    Time 8    Period Weeks    Status On-going      PT LONG TERM GOAL #4   Title Improve Rt knee ROM 0-130 deg    Baseline now 1-130    Time 8    Period Weeks    Status On-going      PT LONG TERM GOAL #5   Title improve 5TSTS test to 12 seconds without UE support    Baseline 14    Time 8    Period Weeks    Status On-going                 Plan - 07/20/20 1537    Clinical Impression Statement She is progressing well with Rt knee ROM and strength post op. Her goal is to get back to walking her dogs and she walks both dogs at the same time to this was simulated today during session with PT resisted walking. She does have some pain in posterior knee and PT can not rule out bakers cyst there but it also has signs of tendonitis at hamstring/gastoc junction. PT recommending to continue POC unil the end of  January then she will most likely be ready for discharge.    Examination-Activity Limitations Stairs;Stand;Lift;Locomotion Level    Examination-Participation Restrictions Cleaning;Driving;Community Activity;Shop;Laundry    Stability/Clinical Decision Making Stable/Uncomplicated    Rehab Potential Excellent    PT Frequency 2x / week    PT Duration 8 weeks   6-8   PT Treatment/Interventions ADLs/Self Care Home Management;Cryotherapy;Electrical Stimulation;Iontophoresis 4mg /ml Dexamethasone;Moist Heat;Ultrasound;Gait training;Stair training;Therapeutic activities;Therapeutic exercise;Balance training;Neuromuscular re-education;Manual techniques;Scar mobilization;Passive range of motion;Dry needling;Joint Manipulations;Vasopneumatic Device;Taping    PT Next Visit Plan Rt knee strength, gait, balance, simulated dog walking    PT Home Exercise Plan Access Code: [email or web address](balance and weight machines)    Consulted and Agree with Plan of Care Patient           Patient will benefit from skilled therapeutic intervention in order to improve the following deficits and impairments:  Abnormal gait,Decreased activity tolerance,Decreased balance,Decreased mobility,Decreased endurance,Decreased range of motion,Decreased strength,Decreased scar mobility,Difficulty walking,Hypomobility,Increased edema,Impaired flexibility,Increased muscle spasms,Pain  Visit Diagnosis: Difficulty in walking, not elsewhere classified  Muscle weakness (generalized)  Acute pain of right knee  Stiffness of right knee, not elsewhere classified  Other abnormalities of gait and mobility  Localized edema     Problem List Patient Active Problem List   Diagnosis Date Noted  . Osteopenia 07/02/2020  . S/P knee  surgery 06/01/2020  . Pre-op evaluation 05/27/2020  . Pulmonary nodule 05/27/2020  . Scleroderma (HCC) 05/05/2020  . Chronic patellofemoral pain of right knee 05/05/2020  . Positive ANA (antinuclear  antibody) 06/05/2019  . 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  . Unilateral primary osteoarthritis, left hip 01/28/2019  . Pelvic pain 12/12/2018  . Common bile duct (CBD) obstruction   . Venous insufficiency of left lower extremity 07/04/2018  . Dyslipidemia 09/22/2017  . DDD (degenerative disc disease), cervical 04/03/2017  . DDD (degenerative disc disease), lumbar 04/03/2017  . Onychomycosis 03/21/2017  . Dysphagia 10/18/2016  . Encounter for chronic pain management 10/18/2016  . Biliary stasis 09/26/2016  . Fever blister 08/18/2016  . HNP (herniated nucleus pulposus), lumbar 09/23/2015  . Sinus congestion 07/30/2015  . Iron deficiency 07/30/2015  . Health maintenance examination 06/15/2015  . 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 Headache 03/09/2015  . Clavicle enlargement 12/13/2014  . Prediabetes 12/13/2014  . Plantar fasciitis, bilateral 09/11/2014  . Advanced care planning/counseling discussion 06/10/2014  . Medicare annual wellness visit, subsequent 06/10/2014  . Abnormal thyroid function test 06/10/2014  . Chronic pain syndrome 06/10/2014  . CFS (chronic fatigue syndrome) 04/08/2014  . Postmenopausal atrophic vaginitis 10/19/2012  . 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 Cervical disc disorder with radiculopathy of cervical region 05/28/2010  . CHEST PAIN UNSPECIFIED 02/28/2008  . APHTHOUS ULCERS 01/31/2008  . Chronic insomnia 11/09/2007  . Drug-induced constipation 10/11/2007  . Allergic rhinitis 04/12/2007  . MDD (major depressive disorder), recurrent episode, moderate (HCC) 03/05/2007  . [name] syndrome 03/05/2007  . GERD 03/05/2007  . ROSACEA 03/05/2007  . NEURALGIA 03/05/2007  . 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 Chronic interstitial cystitis 12/06/2006  . Fibromyalgia 12/06/2006    April Manson ,PT,DPT 07/20/2020, 3:41 PM  [facility] [address] [place], [place],  [address] Phone: [phone]   Fax:  [phone]  Name: Rachel Duran MRN: [id number] Date of Birth: 11-06-1955

## 2020-07-20 NOTE — Telephone Encounter (Signed)
Received refill request on Tizanidine 2mg  tablet. Take one tablet q 8 hours prn #30. Last filled 06/12/2020.  Can you please advise since Dr. 06/14/2020 are out of the office?

## 2020-07-20 NOTE — Telephone Encounter (Signed)
Refill sent to pharmacy.

## 2020-07-20 NOTE — Telephone Encounter (Addendum)
Spoke with [employer] asking about PA.  Told it is approved, given PA appoval # [id number], valid 07/25/2020- 07/24/2021.  Says they will fax approval.    Notified pt.

## 2020-07-20 NOTE — Telephone Encounter (Signed)
Ok to refill 

## 2020-07-20 NOTE — Telephone Encounter (Signed)
Called and spoke with patient letting her know that this is her 3 month follow up that was advised by Dr. Isaiah Serge at her last OV in October. Patient expressed understanding and stated that she would see [place] on the 4th. Nothing further needed at this time.

## 2020-07-20 NOTE — Telephone Encounter (Signed)
ERx 

## 2020-07-20 NOTE — Telephone Encounter (Signed)
Name of Medication: Eszopiclone Name of Pharmacy: [facility] Last Fill or Written Date and Quantity: 04/30/20, #90 Last Office Visit and Type: 07/10/20, AWV Next Office Visit and Type: 10/13/20, 3 mo chronic pain mgmt Last Controlled Substance Agreement Date: 04/19/19 Last UDS: 04/19/19  [facility] last filled:  04/14/20, #90

## 2020-07-20 NOTE — Addendum Note (Signed)
Addended by: Rogers Seeds on: 07/20/2020 03:27 PM   Modules accepted: Orders

## 2020-07-22 ENCOUNTER — Ambulatory Visit (INDEPENDENT_AMBULATORY_CARE_PROVIDER_SITE_OTHER): Payer: Medicare Other | Admitting: Physical Therapy

## 2020-07-22 ENCOUNTER — Other Ambulatory Visit: Payer: Self-pay

## 2020-07-22 DIAGNOSIS — M25561 Pain in right knee: Secondary | ICD-10-CM | POA: Diagnosis not present

## 2020-07-22 DIAGNOSIS — G8929 Other chronic pain: Secondary | ICD-10-CM

## 2020-07-22 DIAGNOSIS — R262 Difficulty in walking, not elsewhere classified: Secondary | ICD-10-CM | POA: Diagnosis not present

## 2020-07-22 DIAGNOSIS — M6281 Muscle weakness (generalized): Secondary | ICD-10-CM | POA: Diagnosis not present

## 2020-07-22 DIAGNOSIS — R2689 Other abnormalities of gait and mobility: Secondary | ICD-10-CM

## 2020-07-22 DIAGNOSIS — M25661 Stiffness of right knee, not elsewhere classified: Secondary | ICD-10-CM | POA: Diagnosis not present

## 2020-07-22 DIAGNOSIS — R600 Localized edema: Secondary | ICD-10-CM

## 2020-07-22 NOTE — Telephone Encounter (Addendum)
Received faxed PA denial.  Reason:  Pt need to try covered drug Dayvigo or doctor needs to give specific medical reasons why the covered drug is not appropriate for pt.

## 2020-07-22 NOTE — Telephone Encounter (Signed)
Faxed PA appeal form and letter.  Decision pending.  Notified pt via MyChart.

## 2020-07-22 NOTE — Telephone Encounter (Addendum)
Letter written for patient. Please send in.  If denied again, pt will need to try Dayvigo before lunesta. plz notify pt.

## 2020-07-22 NOTE — Therapy (Signed)
[facility] Therapy [address] [place], [place], [address] Phone: [phone]   Fax:  [phone]  Physical Therapy Treatment  Patient Details  Name: Rachel Duran MRN: [id number] Date of Birth: 09-22-1955 Referring Provider (PT): August Saucer Corrie Mckusick, MD   Encounter Date: 07/22/2020   PT End of Session - 07/22/20 1428    Visit Number 6    Number of Visits 16    Date for PT Re-Evaluation 08/20/20    PT Start Time 1345    PT Stop Time 1431    PT Time Calculation (min) 46 min    Activity Tolerance Patient tolerated treatment well;No increased pain    Behavior During Therapy WFL for tasks assessed/performed           Past Medical History:  Diagnosis Date  . Abdominal pain last 4 months   and nausea also  . Allergy   . Anemia    history of  . Anxiety   . Bipolar disorder (HCC)    atpical bipolar disorder  . 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 C6- C7 -hx. past fusion(bone graft used)  . Cholecystitis   . Chronic pain   . DDD (degenerative disc disease), lumbar 09/2015   dextroscoliosis with multilevel DDD and facet arthrosis most notable for R foraminal disc protrusion L4/5 producing severe R neural foraminal stenosis abutting R L4 nerve root, moderate spinal canal and mild lat recesss and R neural foraminal stenosis L3/4 (MRI)  . Depression    bipolar depression  . Disorders of porphyrin metabolism   . Felon of finger of left hand 11/10/2016  . Fibromyalgia   . GERD (gastroesophageal reflux disease)   . Headache    occasionally  . Hypertension   . Internal hemorrhoids   . Interstitial cystitis 06-06-12   hx.  . Irritable bowel syndrome   . PONV (postoperative nausea and vomiting)    now uses stomach blockers and no ponv  . 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  Raynauds disease    hx.  . Seronegative arthritis    [name]  . 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 ANTERIOR CERVICAL DECOMP/DISCECTOMY FUSION  2004   C5/6, C6/7  . ANTERIOR CERVICAL DECOMP/DISCECTOMY FUSION  02/2016   C3/4, C4/5 with plating Lovell Sheehan)  . AUGMENTATION MAMMAPLASTY Bilateral 03/25/2010  . BREAST ENHANCEMENT SURGERY  2010  . BREAST IMPLANT EXCHANGE  10/2014   exchange saline implants, B mastopexy/capsulorraphy ([facility])  . BUNIONECTOMY Bilateral yrs ago  . CESAREAN SECTION  1985   x 1  . CHOLECYSTECTOMY  06/11/2012   Procedure: LAPAROSCOPIC CHOLECYSTECTOMY WITH INTRAOPERATIVE CHOLANGIOGRAM;  Surgeon: Atilano Ina, MD,FACS;  Location: [facility];  Service: General;  Laterality: N/A;  . COLONOSCOPY  02/2018   done for positive cologuard - WNL, rpt 10 yrs Russella Dar)  . CYSTOSCOPY    . ERCP  05/22/2012   Procedure: ENDOSCOPIC RETROGRADE CHOLANGIOPANCREATOGRAPHY (ERCP);  Surgeon: Meryl Dare, MD,FACG;  Location: [facility];  Service: Endoscopy;  Laterality: N/A;  . ERCP N/A 09/17/2013   Procedure: ENDOSCOPIC RETROGRADE CHOLANGIOPANCREATOGRAPHY (ERCP);  Surgeon: Meryl Dare, MD;  Location: [facility];  Service: Endoscopy;  Laterality: N/A;  . ERCP N/A 09/27/2018  Procedure: ENDOSCOPIC RETROGRADE CHOLANGIOPANCREATOGRAPHY (ERCP);  Surgeon: Meryl Dare, MD;  Location: [facility];  Service: Endoscopy;  Laterality: N/A;  . ESOPHAGOGASTRODUODENOSCOPY  09/2016   WNL. esophagus dilated Russella Dar)  . HEMORRHOID BANDING  09-23-13   --Dr. Gaynelle Adu  . HERNIA REPAIR     inguinal  . HYSTEROSCOPY W/ ENDOMETRIAL ABLATION    . NASAL SINUS SURGERY     x5  . PARTIAL KNEE ARTHROPLASTY Right 06/01/2020   Procedure: Right knee patellofemoral replacement;  Surgeon: Cammy Copa, MD;  Location: [facility];  Service: Orthopedics;  Laterality:  Right;  . REMOVAL OF STONES  09/27/2018   Procedure: REMOVAL OF STONES;  Surgeon: Meryl Dare, MD;  Location: [facility];  Service: Endoscopy;;  . Dennison Mascot  09/27/2018   Procedure: SPHINCTEROTOMY;  Surgeon: Meryl Dare, MD;  Location: [facility];  Service: Endoscopy;;  . TOTAL HIP ARTHROPLASTY Left 03/12/2019   Procedure: LEFT TOTAL HIP ARTHROPLASTY ANTERIOR APPROACH;  Magnus Ivan, Vanita Panda, MD)  . UPPER GASTROINTESTINAL ENDOSCOPY      There were no vitals filed for this visit.   Subjective Assessment - 07/22/20 1424    Subjective relays still having overall posterior Rt knee pain and a little more anterior knee pain after a lot of standing activity taking down all her    How long can you stand comfortably? she thinks one hour    Patient Stated Goals be able to walk her dogs 2 miles    Pain Onset More than a month ago                             [facility] Adult PT Treatment/Exercise - 07/22/20 0001      Knee/Hip Exercises: Stretches   Active Hamstring Stretch Right;3 reps;30 seconds    Gastroc Stretch Right;3 reps;30 seconds      Knee/Hip Exercises: Aerobic   Recumbent Bike seat 5, L2 10 min      Knee/Hip Exercises: Machines for Strengthening   Cybex Knee Extension 10# slow eccentrics X10 then 5# X 10    Cybex Knee Flexion 15 lbs 2 sets of 10    Cybex Leg Press 25# bilat push 2 sets of 10      Knee/Hip Exercises: Standing   Step Down Right;15 reps;Hand Hold: 1;Step Height: 6"    Step Down Limitations stepping down with left leg and back up with left leg      Knee/Hip Exercises: Seated   Sit to Sand 2 sets;10 reps;without UE support      Manual Therapy   Manual therapy comments STM/IASTM to gastroc/hamstrings, KT tape 3 I strip basket weave for posterior knee edema                    PT Short Term Goals - 07/08/20 1713      PT SHORT TERM GOAL #1   Title Be independent with initial home exercise program for self-management of  symptoms.    Time 4    Period Weeks    Status Achieved    Target Date 07/23/20             PT Long Term Goals - 07/20/20 1540      PT LONG TERM GOAL #1   Title Pt will improve FOTO score to at least 57%    Time 8    Period Weeks    Status On-going      PT  LONG TERM GOAL #2   Title She will be able to ambulate community distances no AD  and stairs with less than 3/10 pain    Baseline can ambulate community distances, need to check stairs.    Time 8    Period Weeks    Status On-going      PT LONG TERM GOAL #3   Title Improve Rt knee strength to 5/5 for improved ability to allow patient to complete valued functional tasks such as gardening, and walking dogs with less difficulty.    Baseline now 4+    Time 8    Period Weeks    Status On-going      PT LONG TERM GOAL #4   Title Improve Rt knee ROM 0-130 deg    Baseline now 1-130    Time 8    Period Weeks    Status On-going      PT LONG TERM GOAL #5   Title improve 5TSTS test to 12 seconds without UE support    Baseline 14    Time 8    Period Weeks    Status On-going                 Plan - 07/22/20 1430    Clinical Impression Statement Continued with knee strength and stretching with good overall tolerance. Added step downs today as she relays she does not trust her knee yet with descending stairs. Trialed KT tape today in efforts to reduce fluid in her Lt posterior knee.    Examination-Activity Limitations Stairs;Stand;Lift;Locomotion Level    Examination-Participation Restrictions Cleaning;Driving;Community Activity;Shop;Laundry    Stability/Clinical Decision Making Stable/Uncomplicated    Rehab Potential Excellent    PT Frequency 2x / week    PT Duration 8 weeks   6-8   PT Treatment/Interventions ADLs/Self Care Home Management;Cryotherapy;Electrical Stimulation;Iontophoresis 4mg /ml Dexamethasone;Moist Heat;Ultrasound;Gait training;Stair training;Therapeutic activities;Therapeutic exercise;Balance  training;Neuromuscular re-education;Manual techniques;Scar mobilization;Passive range of motion;Dry needling;Joint Manipulations;Vasopneumatic Device;Taping    PT Next Visit Plan how was KT tape. Rt knee strength, gait, balance, simulated dog walking, step downs or work on descending stairs    PT Home Exercise Plan Access Code: [email or web address](balance and weight machines)    Consulted and Agree with Plan of Care Patient           Patient will benefit from skilled therapeutic intervention in order to improve the following deficits and impairments:  Abnormal gait,Decreased activity tolerance,Decreased balance,Decreased mobility,Decreased endurance,Decreased range of motion,Decreased strength,Decreased scar mobility,Difficulty walking,Hypomobility,Increased edema,Impaired flexibility,Increased muscle spasms,Pain  Visit Diagnosis: Difficulty in walking, not elsewhere classified  Muscle weakness (generalized)  Acute pain of right knee  Stiffness of right knee, not elsewhere classified  Other abnormalities of gait and mobility  Localized edema  Chronic pain of right knee     Problem List Patient Active Problem List   Diagnosis Date Noted  . Osteopenia 07/02/2020  . S/P knee surgery 06/01/2020  . Pre-op evaluation 05/27/2020  . Pulmonary nodule 05/27/2020  . Scleroderma ([place]) 05/05/2020  . Chronic patellofemoral pain of right knee 05/05/2020  . Positive ANA (antinuclear antibody) 06/05/2019  . 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  . Unilateral primary osteoarthritis, left hip 01/28/2019  . Pelvic pain 12/12/2018  . Common bile duct (CBD) obstruction   . Venous insufficiency of left lower extremity 07/04/2018  . Dyslipidemia 09/22/2017  . DDD (degenerative disc disease), cervical 04/03/2017  . DDD (degenerative disc disease), lumbar 04/03/2017  . Onychomycosis 03/21/2017  . Dysphagia 10/18/2016  . Encounter for  chronic pain management 10/18/2016  . Biliary  stasis 09/26/2016  . Fever blister 08/18/2016  . HNP (herniated nucleus pulposus), lumbar 09/23/2015  . Sinus congestion 07/30/2015  . Iron deficiency 07/30/2015  . Health maintenance examination 06/15/2015  . 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 Headache 03/09/2015  . Clavicle enlargement 12/13/2014  . Prediabetes 12/13/2014  . Plantar fasciitis, bilateral 09/11/2014  . Advanced care planning/counseling discussion 06/10/2014  . Medicare annual wellness visit, subsequent 06/10/2014  . Abnormal thyroid function test 06/10/2014  . Chronic pain syndrome 06/10/2014  . CFS (chronic fatigue syndrome) 04/08/2014  . Postmenopausal atrophic vaginitis 10/19/2012  . 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 Cervical disc disorder with radiculopathy of cervical region 05/28/2010  . CHEST PAIN UNSPECIFIED 02/28/2008  . APHTHOUS ULCERS 01/31/2008  . Chronic insomnia 11/09/2007  . Drug-induced constipation 10/11/2007  . Allergic rhinitis 04/12/2007  . MDD (major depressive disorder), recurrent episode, moderate (HCC) 03/05/2007  . [name] syndrome 03/05/2007  . GERD 03/05/2007  . ROSACEA 03/05/2007  . NEURALGIA 03/05/2007  . 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 Chronic interstitial cystitis 12/06/2006  . Fibromyalgia 12/06/2006    Birdie Riddle 07/22/2020, 2:33 PM  [facility] Physical Therapy [address] [place], [place], [address] Phone: [phone]   Fax:  [phone]  Name: Rachel Duran MRN: [id number] Date of Birth: 1955-10-31

## 2020-07-27 ENCOUNTER — Other Ambulatory Visit: Payer: Self-pay

## 2020-07-27 ENCOUNTER — Encounter: Payer: Medicare Other | Admitting: Physical Therapy

## 2020-07-27 ENCOUNTER — Ambulatory Visit (INDEPENDENT_AMBULATORY_CARE_PROVIDER_SITE_OTHER): Payer: Medicare Other | Admitting: Physical Therapy

## 2020-07-27 DIAGNOSIS — M6281 Muscle weakness (generalized): Secondary | ICD-10-CM | POA: Diagnosis not present

## 2020-07-27 DIAGNOSIS — R600 Localized edema: Secondary | ICD-10-CM

## 2020-07-27 DIAGNOSIS — R262 Difficulty in walking, not elsewhere classified: Secondary | ICD-10-CM

## 2020-07-27 DIAGNOSIS — M25661 Stiffness of right knee, not elsewhere classified: Secondary | ICD-10-CM

## 2020-07-27 DIAGNOSIS — M25561 Pain in right knee: Secondary | ICD-10-CM

## 2020-07-27 DIAGNOSIS — R2689 Other abnormalities of gait and mobility: Secondary | ICD-10-CM

## 2020-07-27 NOTE — Therapy (Signed)
[facility] [address] [place], [place], [address] Phone: [phone]   Fax:  [phone]  Physical Therapy Treatment  Patient Details  Name: Rachel Duran MRN: [id number] Date of Birth: [birth year] Referring Provider (PT): Marlou Sa Tonna Corner, MD   Encounter Date: 07/27/2020   PT End of Session - 07/27/20 1512    Visit Number 7    Number of Visits 16    Date for PT Re-Evaluation 08/20/20    PT Start Time [id number]    PT Stop Time 1509    PT Time Calculation (min) 41 min    Activity Tolerance Patient tolerated treatment well;No increased pain    Behavior During Therapy WFL for tasks assessed/performed           Past Medical History:  Diagnosis Date  . Abdominal pain last 4 months   and nausea also  . Allergy   . Anemia    history of  . Anxiety   . Bipolar disorder ([place])    atpical bipolar disorder  . 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 C6- C7 -hx. past fusion(bone graft used)  . Cholecystitis   . Chronic pain   . DDD (degenerative disc disease), lumbar 09/2015   dextroscoliosis with multilevel DDD and facet arthrosis most notable for R foraminal disc protrusion L4/5 producing severe R neural foraminal stenosis abutting R L4 nerve root, moderate spinal canal and mild lat recesss and R neural foraminal stenosis L3/4 (MRI)  . Depression    bipolar depression  . Disorders of porphyrin metabolism   . Felon of finger of left hand 11/10/2016  . Fibromyalgia   . GERD (gastroesophageal reflux disease)   . Headache    occasionally  . Hypertension   . Internal hemorrhoids   . Interstitial cystitis 06-06-12   hx.  . Irritable bowel syndrome   . PONV (postoperative nausea and vomiting)    now uses stomach blockers and no ponv  . 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   Evaluated in Pulmonary clinic/ [facility]/ [place] /  02/07/12 > referred to Health Dept 02/10/2012     - POS GOLD    01/31/2012    .  Raynauds disease    hx.  . Seronegative arthritis    [name]  . 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 ANTERIOR CERVICAL DECOMP/DISCECTOMY FUSION  2004   C5/6, C6/7  . ANTERIOR CERVICAL DECOMP/DISCECTOMY FUSION  02/2016   C3/4, C4/5 with plating [name])  . AUGMENTATION MAMMAPLASTY Bilateral 03/25/2010  . BREAST ENHANCEMENT SURGERY  2010  . BREAST IMPLANT EXCHANGE  10/2014   exchange saline implants, B mastopexy/capsulorraphy ([facility])  . BUNIONECTOMY Bilateral yrs ago  . [place]   x 1  . CHOLECYSTECTOMY  06/11/2012   Procedure: LAPAROSCOPIC CHOLECYSTECTOMY WITH INTRAOPERATIVE CHOLANGIOGRAM;  Surgeon: [name], MD,FACS;  Location: [facility];  Service: General;  Laterality: N/A;  . COLONOSCOPY  02/2018   done for positive cologuard - WNL, rpt 10 yrs Fuller Plan)  . CYSTOSCOPY    . ERCP  05/22/2012   Procedure: ENDOSCOPIC RETROGRADE CHOLANGIOPANCREATOGRAPHY (ERCP);  Surgeon: [name], MD,FACG;  Location: [facility];  Service: Endoscopy;  Laterality: N/A;  . ERCP N/A 09/17/2013   Procedure: ENDOSCOPIC RETROGRADE CHOLANGIOPANCREATOGRAPHY (ERCP);  Surgeon: [name], MD;  Location: [facility];  Service: Endoscopy;  Laterality: N/A;  . ERCP N/A 09/27/2018  Procedure: ENDOSCOPIC RETROGRADE CHOLANGIOPANCREATOGRAPHY (ERCP);  Surgeon: [name], MD;  Location: [facility];  Service: Endoscopy;  Laterality: N/A;  . ESOPHAGOGASTRODUODENOSCOPY  09/2016   WNL. esophagus dilated Fuller Plan)  . HEMORRHOID BANDING  09-23-13   --Dr. Greer Pickerel  . HERNIA REPAIR     inguinal  . HYSTEROSCOPY W/ ENDOMETRIAL ABLATION    . NASAL SINUS SURGERY     x5  . PARTIAL KNEE ARTHROPLASTY Right 06/01/2020   Procedure: Right knee patellofemoral replacement;  Surgeon: [name], MD;  Location: [facility];  Service: Orthopedics;  Laterality:  Right;  . REMOVAL OF STONES  09/27/2018   Procedure: REMOVAL OF STONES;  Surgeon: [name], MD;  [facility]: [facility];  Service: Endoscopy;;  . [name]  09/27/2018   Procedure: SPHINCTEROTOMY;  Surgeon: [name], MD;  [facility]: [facility];  Service: Endoscopy;;  . TOTAL HIP ARTHROPLASTY Left 03/12/2019   Procedure: LEFT TOTAL HIP ARTHROPLASTY ANTERIOR APPROACH;  [name], [name], MD)  . UPPER GASTROINTESTINAL ENDOSCOPY      There were no vitals filed for this visit.   Subjective Assessment - 07/27/20 1424    Subjective posterior knee pain is much improved; Rt knee is stiff today    How long can you stand comfortably? she thinks one hour    Patient Stated Goals be able to walk her dogs 2 miles    Currently in Pain? No/denies    Pain Onset More than a month ago                             [facility] Adult PT Treatment/Exercise - 07/27/20 1431      Knee/Hip Exercises: Stretches   Active Hamstring Stretch Right;3 reps;30 seconds    Active Hamstring Stretch Limitations supine with strap, overpressure at distal thigh    Gastroc Stretch Both;3 reps;30 seconds   slantboard     Knee/Hip Exercises: Aerobic   Recumbent Bike seat 5, L3 10 min      Knee/Hip Exercises: Machines for Strengthening   Cybex Knee Extension 10# slow eccentrics 2 X10; focus on RLE    Cybex Knee Flexion 15 lbs 2 sets of 10; focus on RLE    Cybex Leg Press 37# RLE only 2x10      Knee/Hip Exercises: Standing   Other Standing Knee Exercises RLE kickstand deadlift with 4# 2x10                    PT Short Term Goals - 07/08/20 1713      PT SHORT TERM GOAL #1   Title Be independent with initial home exercise program for self-management of symptoms.    Time 4    Period Weeks    Status Achieved    Target Date 07/23/20             PT Long Term Goals - 07/20/20 1540      PT LONG TERM GOAL #1   Title Pt will improve FOTO score to at least 57%    Time 8     Period Weeks    Status On-going      PT LONG TERM GOAL #2   Title She will be able to ambulate community distances no AD  and stairs with less than 3/10 pain    Baseline can ambulate community distances, need to check stairs.    Time 8    Period Weeks  Status On-going      PT LONG TERM GOAL #3   Title Improve Rt knee strength to 5/5 for improved ability to allow patient to complete valued functional tasks such as gardening, and walking dogs with less difficulty.    Baseline now 4+    Time 8    Period Weeks    Status On-going      PT LONG TERM GOAL #4   Title Improve Rt knee ROM 0-130 deg    Baseline now 1-130    Time 8    Period Weeks    Status On-going      PT LONG TERM GOAL #5   Title improve 5TSTS test to 12 seconds without UE support    Baseline 14    Time 8    Period Weeks    Status On-going                 Plan - 07/27/20 1512    Clinical Impression Statement Pt reported significant improvement in posterior knee pain with addition of stretches and taping with near resolve of symptoms today.  Held taping today as she is scheduled to return to PT in 2 days, so can tape then if needed.  Progressing very well with PT and feel likely ready for d/c next 1-2 weeks.    Examination-Activity Limitations Stairs;Stand;Lift;Locomotion Level    Examination-Participation Restrictions Cleaning;Driving;Community Activity;Shop;Laundry    Stability/Clinical Decision Making Stable/Uncomplicated    Rehab Potential Excellent    PT Frequency 2x / week    PT Duration 8 weeks   6-8   PT Treatment/Interventions ADLs/Self Care Home Management;Cryotherapy;Electrical Stimulation;Iontophoresis 4mg /ml Dexamethasone;Moist Heat;Ultrasound;Gait training;Stair training;Therapeutic activities;Therapeutic exercise;Balance training;Neuromuscular re-education;Manual techniques;Scar mobilization;Passive range of motion;Dry needling;Joint Manipulations;Vasopneumatic Device;Taping    PT Next Visit  Plan Rt knee strength, gait, balance, simulated dog walking, step downs or work on descending stairs; see if tape needed again    PT Home Exercise Plan Access Code: [email or web address](balance and weight machines)    Consulted and Agree with Plan of Care Patient           Patient will benefit from skilled therapeutic intervention in order to improve the following deficits and impairments:  Abnormal gait,Decreased activity tolerance,Decreased balance,Decreased mobility,Decreased endurance,Decreased range of motion,Decreased strength,Decreased scar mobility,Difficulty walking,Hypomobility,Increased edema,Impaired flexibility,Increased muscle spasms,Pain  Visit Diagnosis: Difficulty in walking, not elsewhere classified  Muscle weakness (generalized)  Acute pain of right knee  Stiffness of right knee, not elsewhere classified  Other abnormalities of gait and mobility  Localized edema     Problem List Patient Active Problem List   Diagnosis Date Noted  . Osteopenia 07/02/2020  . S/P knee surgery 06/01/2020  . Pre-op evaluation 05/27/2020  . Pulmonary nodule 05/27/2020  . Scleroderma ([place]) 05/05/2020  . Chronic patellofemoral pain of right knee 05/05/2020  . Positive ANA (antinuclear antibody) 06/05/2019  . 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  . Unilateral primary osteoarthritis, left hip 01/28/2019  . Pelvic pain 12/12/2018  . Common bile duct (CBD) obstruction   . Venous insufficiency of left lower extremity 07/04/2018  . Dyslipidemia 09/22/2017  . DDD (degenerative disc disease), cervical 04/03/2017  . DDD (degenerative disc disease), lumbar 04/03/2017  . Onychomycosis 03/21/2017  . Dysphagia 10/18/2016  . Encounter for chronic pain management 10/18/2016  . Biliary stasis 09/26/2016  . Fever blister 08/18/2016  . HNP (herniated nucleus pulposus), lumbar 09/23/2015  . Sinus congestion 07/30/2015  . Iron deficiency 07/30/2015  . Health maintenance  examination 06/15/2015  .  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 Headache 03/09/2015  . Clavicle enlargement 12/13/2014  . Prediabetes 12/13/2014  . Plantar fasciitis, bilateral 09/11/2014  . Advanced care planning/counseling discussion 06/10/2014  . Medicare annual wellness visit, subsequent 06/10/2014  . Abnormal thyroid function test 06/10/2014  . Chronic pain syndrome 06/10/2014  . CFS (chronic fatigue syndrome) 04/08/2014  . Postmenopausal atrophic vaginitis 10/19/2012  . 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 Cervical disc disorder with radiculopathy of cervical region 05/28/2010  . CHEST PAIN UNSPECIFIED 02/28/2008  . APHTHOUS ULCERS 01/31/2008  . Chronic insomnia 11/09/2007  . Drug-induced constipation 10/11/2007  . Allergic rhinitis 04/12/2007  . MDD (major depressive disorder), recurrent episode, moderate (HCC) 03/05/2007  . [name] syndrome 03/05/2007  . GERD 03/05/2007  . ROSACEA 03/05/2007  . NEURALGIA 03/05/2007  . 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 Chronic interstitial cystitis 12/06/2006  . Fibromyalgia 12/06/2006      Clarita Crane, PT, DPT 07/27/20 3:14 PM   [facility] Physical [facility] [address] [place], [place], [address] Phone: [phone]   Fax:  [phone]  Name: Yarethzi Branan MRN: [id number] Date of Birth: [birth year]

## 2020-07-28 ENCOUNTER — Ambulatory Visit: Payer: Medicare Other | Admitting: Pulmonary Disease

## 2020-07-29 ENCOUNTER — Other Ambulatory Visit: Payer: Self-pay

## 2020-07-29 ENCOUNTER — Encounter: Payer: Self-pay | Admitting: Physical Therapy

## 2020-07-29 ENCOUNTER — Ambulatory Visit (INDEPENDENT_AMBULATORY_CARE_PROVIDER_SITE_OTHER): Payer: Medicare Other | Admitting: Physical Therapy

## 2020-07-29 DIAGNOSIS — M25661 Stiffness of right knee, not elsewhere classified: Secondary | ICD-10-CM

## 2020-07-29 DIAGNOSIS — R262 Difficulty in walking, not elsewhere classified: Secondary | ICD-10-CM

## 2020-07-29 DIAGNOSIS — M6281 Muscle weakness (generalized): Secondary | ICD-10-CM

## 2020-07-29 DIAGNOSIS — M25561 Pain in right knee: Secondary | ICD-10-CM | POA: Diagnosis not present

## 2020-07-29 DIAGNOSIS — G8929 Other chronic pain: Secondary | ICD-10-CM

## 2020-07-29 DIAGNOSIS — R2689 Other abnormalities of gait and mobility: Secondary | ICD-10-CM

## 2020-07-29 DIAGNOSIS — R600 Localized edema: Secondary | ICD-10-CM

## 2020-07-29 NOTE — Therapy (Signed)
[facility] [address] [place], [place], [address] Phone: [phone]   Fax:  [phone]  Physical Therapy Treatment  Patient Details  Name: Rachel Duran MRN: [id number] Date of Birth: 08-Jan-1956 Referring Provider (PT): August Saucer Corrie Mckusick, MD   Encounter Date: 07/29/2020   PT End of Session - 07/29/20 1341    Visit Number 8    Number of Visits 16    Date for PT Re-Evaluation 08/20/20    PT Start Time 1300    PT Stop Time 1340    PT Time Calculation (min) 40 min    Activity Tolerance Patient tolerated treatment well;No increased pain    Behavior During Therapy WFL for tasks assessed/performed           Past Medical History:  Diagnosis Date  . Abdominal pain last 4 months   and nausea also  . Allergy   . Anemia    history of  . Anxiety   . Bipolar disorder (HCC)    atpical bipolar disorder  . 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 C6- C7 -hx. past fusion(bone graft used)  . Cholecystitis   . Chronic pain   . DDD (degenerative disc disease), lumbar 09/2015   dextroscoliosis with multilevel DDD and facet arthrosis most notable for R foraminal disc protrusion L4/5 producing severe R neural foraminal stenosis abutting R L4 nerve root, moderate spinal canal and mild lat recesss and R neural foraminal stenosis L3/4 (MRI)  . Depression    bipolar depression  . Disorders of porphyrin metabolism   . Felon of finger of left hand 11/10/2016  . Fibromyalgia   . GERD (gastroesophageal reflux disease)   . Headache    occasionally  . Hypertension   . Internal hemorrhoids   . Interstitial cystitis 06-06-12   hx.  . Irritable bowel syndrome   . PONV (postoperative nausea and vomiting)    now uses stomach blockers and no ponv  . 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   Evaluated in Pulmonary clinic/ [facility]/ [place] /  02/07/12 > referred to Health Dept 02/10/2012     - POS GOLD    01/31/2012    .  Raynauds disease    hx.  . Seronegative arthritis    [name]  . 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 ANTERIOR CERVICAL DECOMP/DISCECTOMY FUSION  2004   C5/6, C6/7  . ANTERIOR CERVICAL DECOMP/DISCECTOMY FUSION  02/2016   C3/4, C4/5 with plating Lovell Sheehan)  . AUGMENTATION MAMMAPLASTY Bilateral 03/25/2010  . BREAST ENHANCEMENT SURGERY  2010  . BREAST IMPLANT EXCHANGE  10/2014   exchange saline implants, B mastopexy/capsulorraphy ([facility])  . BUNIONECTOMY Bilateral yrs ago  . CESAREAN SECTION  1985   x 1  . CHOLECYSTECTOMY  06/11/2012   Procedure: LAPAROSCOPIC CHOLECYSTECTOMY WITH INTRAOPERATIVE CHOLANGIOGRAM;  Surgeon: Atilano Ina, MD,FACS;  [facility]: [facility];  Service: General;  Laterality: N/A;  . COLONOSCOPY  02/2018   done for positive cologuard - WNL, rpt 10 yrs Russella Dar)  . CYSTOSCOPY    . ERCP  05/22/2012   Procedure: ENDOSCOPIC RETROGRADE CHOLANGIOPANCREATOGRAPHY (ERCP);  Surgeon: Meryl Dare, MD,FACG;  Location: [facility];  Service: Endoscopy;  Laterality: N/A;  . ERCP N/A 09/17/2013   Procedure: ENDOSCOPIC RETROGRADE CHOLANGIOPANCREATOGRAPHY (ERCP);  Surgeon: Meryl Dare, MD;  Location: [facility];  Service: Endoscopy;  Laterality: N/A;  . ERCP N/A 09/27/2018  Procedure: ENDOSCOPIC RETROGRADE CHOLANGIOPANCREATOGRAPHY (ERCP);  Surgeon: [name], MD;  Location: [facility];  Service: Endoscopy;  Laterality: N/A;  . ESOPHAGOGASTRODUODENOSCOPY  09/2016   WNL. esophagus dilated Fuller Plan)  . HEMORRHOID BANDING  09-23-13   --Dr. Greer Pickerel  . HERNIA REPAIR     inguinal  . HYSTEROSCOPY W/ ENDOMETRIAL ABLATION    . NASAL SINUS SURGERY     x5  . PARTIAL KNEE ARTHROPLASTY Right 06/01/2020   Procedure: Right knee patellofemoral replacement;  Surgeon: [name], MD;  Location: [facility];  Service: Orthopedics;  Laterality:  Right;  . REMOVAL OF STONES  09/27/2018   Procedure: REMOVAL OF STONES;  Surgeon: [name], MD;  [facility];  Service: Endoscopy;;  . [name]  09/27/2018   Procedure: SPHINCTEROTOMY;  Surgeon: [name], MD;  [facility];  Service: Endoscopy;;  . TOTAL HIP ARTHROPLASTY Left 03/12/2019   Procedure: LEFT TOTAL HIP ARTHROPLASTY ANTERIOR APPROACH;  [name], [name], MD)  . UPPER GASTROINTESTINAL ENDOSCOPY      There were no vitals filed for this visit.   Subjective Assessment - 07/29/20 1303    Subjective yesterday had more pain at the end of the day but had increased activity running errands yesterday.  overall that seems to be resolved today.    How long can you stand comfortably? she thinks one hour    Patient Stated Goals be able to walk her dogs 2 miles    Currently in Pain? No/denies                             [facility] Adult PT Treatment/Exercise - 07/29/20 1304      Knee/Hip Exercises: Stretches   Gastroc Stretch Both;3 reps;30 seconds   slantboard     Knee/Hip Exercises: Aerobic   Recumbent Bike seat 4, L3 10 min      Knee/Hip Exercises: Machines for Strengthening   Cybex Knee Extension 10# slow eccentrics 3 X10; focus on RLE    Cybex Knee Flexion 15 lbs 3 sets of 10; focus on RLE      Knee/Hip Exercises: Standing   SLS 2x10 sec bil    Other Standing Knee Exercises cable walk backwards/forwards 45#    Other Standing Knee Exercises tandem stand 2x30 sec bil                    PT Short Term Goals - 07/08/20 1713      PT SHORT TERM GOAL #1   Title Be independent with initial home exercise program for self-management of symptoms.    Time 4    Period Weeks    Status Achieved    Target Date 07/23/20             PT Long Term Goals - 07/20/20 1540      PT LONG TERM GOAL #1   Title Pt will improve FOTO score to at least 57%    Time 8    Period Weeks    Status On-going      PT LONG TERM  GOAL #2   Title She will be able to ambulate community distances no AD  and stairs with less than 3/10 pain    Baseline can ambulate community distances, need to check stairs.    Time 8    Period Weeks    Status On-going      PT LONG TERM GOAL #3  Title Improve Rt knee strength to 5/5 for improved ability to allow patient to complete valued functional tasks such as gardening, and walking dogs with less difficulty.    Baseline now 4+    Time 8    Period Weeks    Status On-going      PT LONG TERM GOAL #4   Title Improve Rt knee ROM 0-130 deg    Baseline now 1-130    Time 8    Period Weeks    Status On-going      PT LONG TERM GOAL #5   Title improve 5TSTS test to 12 seconds without UE support    Baseline 14    Time 8    Period Weeks    Status On-going                 Plan - 07/29/20 1342    Clinical Impression Statement Pt tolerated session well today and recommended trial of walking 1 dog over the weekend to see how it goes.  Overall anticipate d/c soon due to great progress to date.    Examination-Activity Limitations Stairs;Stand;Lift;Locomotion Level    Examination-Participation Restrictions Cleaning;Driving;Community Activity;Shop;Laundry    Stability/Clinical Decision Making Stable/Uncomplicated    Rehab Potential Excellent    PT Frequency 2x / week    PT Duration 8 weeks   6-8   PT Treatment/Interventions ADLs/Self Care Home Management;Cryotherapy;Electrical Stimulation;Iontophoresis 4mg /ml Dexamethasone;Moist Heat;Ultrasound;Gait training;Stair training;Therapeutic activities;Therapeutic exercise;Balance training;Neuromuscular re-education;Manual techniques;Scar mobilization;Passive range of motion;Dry needling;Joint Manipulations;Vasopneumatic Device;Taping    PT Next Visit Plan Rt knee strength, gait, balance, simulated dog walking, step downs or work on descending stairs    PT Home Exercise Plan Access Code: [email or web address](balance and weight machines)     Consulted and Agree with Plan of Care Patient           Patient will benefit from skilled therapeutic intervention in order to improve the following deficits and impairments:  Abnormal gait,Decreased activity tolerance,Decreased balance,Decreased mobility,Decreased endurance,Decreased range of motion,Decreased strength,Decreased scar mobility,Difficulty walking,Hypomobility,Increased edema,Impaired flexibility,Increased muscle spasms,Pain  Visit Diagnosis: Difficulty in walking, not elsewhere classified  Muscle weakness (generalized)  Acute pain of right knee  Stiffness of right knee, not elsewhere classified  Other abnormalities of gait and mobility  Localized edema  Chronic pain of right knee     Problem List Patient Active Problem List   Diagnosis Date Noted  . Osteopenia 07/02/2020  . S/P knee surgery 06/01/2020  . Pre-op evaluation 05/27/2020  . Pulmonary nodule 05/27/2020  . Scleroderma ([place]) 05/05/2020  . Chronic patellofemoral pain of right knee 05/05/2020  . Positive ANA (antinuclear antibody) 06/05/2019  . 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  . Unilateral primary osteoarthritis, left hip 01/28/2019  . Pelvic pain 12/12/2018  . Common bile duct (CBD) obstruction   . Venous insufficiency of left lower extremity 07/04/2018  . Dyslipidemia 09/22/2017  . DDD (degenerative disc disease), cervical 04/03/2017  . DDD (degenerative disc disease), lumbar 04/03/2017  . Onychomycosis 03/21/2017  . Dysphagia 10/18/2016  . Encounter for chronic pain management 10/18/2016  . Biliary stasis 09/26/2016  . Fever blister 08/18/2016  . HNP (herniated nucleus pulposus), lumbar 09/23/2015  . Sinus congestion 07/30/2015  . Iron deficiency 07/30/2015  . Health maintenance examination 06/15/2015  . 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 Headache 03/09/2015  . Clavicle enlargement 12/13/2014  . Prediabetes 12/13/2014  . Plantar fasciitis, bilateral 09/11/2014  .  Advanced care planning/counseling discussion 06/10/2014  . Medicare annual wellness visit, subsequent 06/10/2014  .  Abnormal thyroid function test 06/10/2014  . Chronic pain syndrome 06/10/2014  . CFS (chronic fatigue syndrome) 04/08/2014  . Postmenopausal atrophic vaginitis 10/19/2012  . 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 Cervical disc disorder with radiculopathy of cervical region 05/28/2010  . CHEST PAIN UNSPECIFIED 02/28/2008  . APHTHOUS ULCERS 01/31/2008  . Chronic insomnia 11/09/2007  . Drug-induced constipation 10/11/2007  . Allergic rhinitis 04/12/2007  . MDD (major depressive disorder), recurrent episode, moderate ([place]) 03/05/2007  . [name] syndrome 03/05/2007  . GERD 03/05/2007  . ROSACEA 03/05/2007  . NEURALGIA 03/05/2007  . 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 Chronic interstitial cystitis 12/06/2006  . Fibromyalgia 12/06/2006      Laureen Abrahams, PT, DPT 07/29/20 1:43 PM    [facility] [address] [place], [place], [address] Phone: [phone]   Fax:  [phone]  Name: Christasia Trench MRN: [id number] Date of Birth: July 01, 1956

## 2020-07-31 ENCOUNTER — Ambulatory Visit: Payer: Medicare Other

## 2020-08-04 ENCOUNTER — Other Ambulatory Visit: Payer: Self-pay

## 2020-08-04 ENCOUNTER — Ambulatory Visit (INDEPENDENT_AMBULATORY_CARE_PROVIDER_SITE_OTHER): Payer: Medicare Other | Admitting: Physical Therapy

## 2020-08-04 DIAGNOSIS — M25561 Pain in right knee: Secondary | ICD-10-CM | POA: Diagnosis not present

## 2020-08-04 DIAGNOSIS — M6281 Muscle weakness (generalized): Secondary | ICD-10-CM

## 2020-08-04 DIAGNOSIS — G8929 Other chronic pain: Secondary | ICD-10-CM

## 2020-08-04 DIAGNOSIS — R2689 Other abnormalities of gait and mobility: Secondary | ICD-10-CM

## 2020-08-04 DIAGNOSIS — M25661 Stiffness of right knee, not elsewhere classified: Secondary | ICD-10-CM

## 2020-08-04 DIAGNOSIS — R262 Difficulty in walking, not elsewhere classified: Secondary | ICD-10-CM | POA: Diagnosis not present

## 2020-08-04 DIAGNOSIS — R600 Localized edema: Secondary | ICD-10-CM

## 2020-08-04 NOTE — Therapy (Signed)
[facility] [address] [place], [place], [address] Phone: [phone]   Fax:  [phone]  Physical Therapy Treatment  Patient Details  Name: Rachel Duran MRN: [id number] Date of Birth: 1955/09/05 Referring Provider (PT): Marlou Sa Tonna Corner, MD   Encounter Date: 08/04/2020   PT End of Session - 08/04/20 1234    Visit Number 9    Number of Visits 16    Date for PT Re-Evaluation 08/20/20    PT Start Time [id number]    PT Stop Time 1232    PT Time Calculation (min) 47 min    Activity Tolerance Patient tolerated treatment well;No increased pain    Behavior During Therapy WFL for tasks assessed/performed           Past Medical History:  Diagnosis Date  . Abdominal pain last 4 months   and nausea also  . Allergy   . Anemia    history of  . Anxiety   . Bipolar disorder ([place])    atpical bipolar disorder  . 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 C6- C7 -hx. past fusion(bone graft used)  . Cholecystitis   . Chronic pain   . DDD (degenerative disc disease), lumbar 09/2015   dextroscoliosis with multilevel DDD and facet arthrosis most notable for R foraminal disc protrusion L4/5 producing severe R neural foraminal stenosis abutting R L4 nerve root, moderate spinal canal and mild lat recesss and R neural foraminal stenosis L3/4 (MRI)  . Depression    bipolar depression  . Disorders of porphyrin metabolism   . Felon of finger of left hand 11/10/2016  . Fibromyalgia   . GERD (gastroesophageal reflux disease)   . Headache    occasionally  . Hypertension   . Internal hemorrhoids   . Interstitial cystitis 06-06-12   hx.  . Irritable bowel syndrome   . PONV (postoperative nausea and vomiting)    now uses stomach blockers and no ponv  . 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   Evaluated in Pulmonary clinic/ [facility]/ [place] /  02/07/12 > referred to Health Dept 02/10/2012     - POS GOLD    01/31/2012    .  Raynauds disease    hx.  . Seronegative arthritis    [name]  . 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 ANTERIOR CERVICAL DECOMP/DISCECTOMY FUSION  2004   C5/6, C6/7  . ANTERIOR CERVICAL DECOMP/DISCECTOMY FUSION  02/2016   C3/4, C4/5 with plating [name])  . AUGMENTATION MAMMAPLASTY Bilateral 03/25/2010  . BREAST ENHANCEMENT SURGERY  2010  . BREAST IMPLANT EXCHANGE  10/2014   exchange saline implants, B mastopexy/capsulorraphy ([facility])  . BUNIONECTOMY Bilateral yrs ago  . [place]   x 1  . CHOLECYSTECTOMY  06/11/2012   Procedure: LAPAROSCOPIC CHOLECYSTECTOMY WITH INTRAOPERATIVE CHOLANGIOGRAM;  Surgeon: [name], MD,FACS;  [facility];  Service: General;  Laterality: N/A;  . COLONOSCOPY  02/2018   done for positive cologuard - WNL, rpt 10 yrs Fuller Plan)  . CYSTOSCOPY    . ERCP  05/22/2012   Procedure: ENDOSCOPIC RETROGRADE CHOLANGIOPANCREATOGRAPHY (ERCP);  Surgeon: [name], MD,FACG;  Location: [facility];  Service: Endoscopy;  Laterality: N/A;  . ERCP N/A 09/17/2013   Procedure: ENDOSCOPIC RETROGRADE CHOLANGIOPANCREATOGRAPHY (ERCP);  Surgeon: [name], MD;  Location: [facility];  Service: Endoscopy;  Laterality: N/A;  . ERCP N/A 09/27/2018  Procedure: ENDOSCOPIC RETROGRADE CHOLANGIOPANCREATOGRAPHY (ERCP);  Surgeon: [name], MD;  Location: [facility];  Service: Endoscopy;  Laterality: N/A;  . ESOPHAGOGASTRODUODENOSCOPY  09/2016   WNL. esophagus dilated Fuller Plan)  . HEMORRHOID BANDING  09-23-13   --Dr. Greer Pickerel  . HERNIA REPAIR     inguinal  . HYSTEROSCOPY W/ ENDOMETRIAL ABLATION    . NASAL SINUS SURGERY     x5  . PARTIAL KNEE ARTHROPLASTY Right 06/01/2020   Procedure: Right knee patellofemoral replacement;  Surgeon: [name], MD;  Location: [facility];  Service: Orthopedics;  Laterality:  Right;  . REMOVAL OF STONES  09/27/2018   Procedure: REMOVAL OF STONES;  Surgeon: [name], MD;  [facility];  Service: Endoscopy;;  . [name]  09/27/2018   Procedure: SPHINCTEROTOMY;  Surgeon: [name], MD;  [facility];  Service: Endoscopy;;  . TOTAL HIP ARTHROPLASTY Left 03/12/2019   Procedure: LEFT TOTAL HIP ARTHROPLASTY ANTERIOR APPROACH;  [name], [name], MD)  . UPPER GASTROINTESTINAL ENDOSCOPY      There were no vitals filed for this visit.   Subjective Assessment - 08/04/20 1211    Subjective Relays she was doing much better then she felt like she had a set back when she stepped out of the shower wrong and now the pain has returned in the back of her leg, she rates this at 3/10    How long can you stand comfortably? she thinks one hour    Patient Stated Goals be able to walk her dogs 2 miles    Pain Onset More than a month ago                             [facility] Adult PT Treatment/Exercise - 08/04/20 0001      Knee/Hip Exercises: Aerobic   Recumbent Bike seat 4, L3 10 min      Knee/Hip Exercises: Machines for Strengthening   Cybex Knee Extension 10# slow eccentrics 3 X10; focus on RLE    Cybex Knee Flexion 15 lbs 3 sets of 10; focus on RLE    Cybex Leg Press 25# 2X15      Knee/Hip Exercises: Standing   Other Standing Knee Exercises cable walk backwards/forwards 45#      Manual Therapy   Manual therapy comments KT tape 3 I strip basket weave for posterior knee edema                    PT Short Term Goals - 07/08/20 1713      PT SHORT TERM GOAL #1   Title Be independent with initial home exercise program for self-management of symptoms.    Time 4    Period Weeks    Status Achieved    Target Date 07/23/20             PT Long Term Goals - 07/20/20 1540      PT LONG TERM GOAL #1   Title Pt will improve FOTO score to at least 57%    Time 8    Period Weeks    Status On-going      PT  LONG TERM GOAL #2   Title She will be able to ambulate community distances no AD  and stairs with less than 3/10 pain    Baseline can ambulate community distances, need to check stairs.    Time 8    Period Weeks  Status On-going      PT LONG TERM GOAL #3   Title Improve Rt knee strength to 5/5 for improved ability to allow patient to complete valued functional tasks such as gardening, and walking dogs with less difficulty.    Baseline now 4+    Time 8    Period Weeks    Status On-going      PT LONG TERM GOAL #4   Title Improve Rt knee ROM 0-130 deg    Baseline now 1-130    Time 8    Period Weeks    Status On-going      PT LONG TERM GOAL #5   Title improve 5TSTS test to 12 seconds without UE support    Baseline 14    Time 8    Period Weeks    Status On-going                 Plan - 08/04/20 1235    Clinical Impression Statement She is doing very well post op, did have more posterior knee pain that could be hamstring/gastroc strain or possible bakers cyst, applied KT tape to this as she feels this made a differeance last time. Overall strength is progressing well and she would benefit from a couple weeks more of PT for strength and to allow her to return to walking her dogs.    Examination-Activity Limitations Stairs;Stand;Lift;Locomotion Level    Examination-Participation Restrictions Cleaning;Driving;Community Activity;Shop;Laundry    Stability/Clinical Decision Making Stable/Uncomplicated    Rehab Potential Excellent    PT Frequency 2x / week    PT Duration 8 weeks   6-8   PT Treatment/Interventions ADLs/Self Care Home Management;Cryotherapy;Electrical Stimulation;Iontophoresis 4mg /ml Dexamethasone;Moist Heat;Ultrasound;Gait training;Stair training;Therapeutic activities;Therapeutic exercise;Balance training;Neuromuscular re-education;Manual techniques;Scar mobilization;Passive range of motion;Dry needling;Joint Manipulations;Vasopneumatic Device;Taping    PT Next  Visit Plan 10th viist progres note. Rt knee strength, gait, balance, simulated dog walking, step downs or work on descending stairs    PT Home Exercise Plan Access Code: [email or web address](balance and weight machines)    Consulted and Agree with Plan of Care Patient           Patient will benefit from skilled therapeutic intervention in order to improve the following deficits and impairments:  Abnormal gait,Decreased activity tolerance,Decreased balance,Decreased mobility,Decreased endurance,Decreased range of motion,Decreased strength,Decreased scar mobility,Difficulty walking,Hypomobility,Increased edema,Impaired flexibility,Increased muscle spasms,Pain  Visit Diagnosis: Difficulty in walking, not elsewhere classified  Muscle weakness (generalized)  Acute pain of right knee  Other abnormalities of gait and mobility  Stiffness of right knee, not elsewhere classified  Localized edema  Chronic pain of right knee     Problem List Patient Active Problem List   Diagnosis Date Noted  . Osteopenia 07/02/2020  . S/P knee surgery 06/01/2020  . Pre-op evaluation 05/27/2020  . Pulmonary nodule 05/27/2020  . Scleroderma ([place]) 05/05/2020  . Chronic patellofemoral pain of right knee 05/05/2020  . Positive ANA (antinuclear antibody) 06/05/2019  . 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  . Unilateral primary osteoarthritis, left hip 01/28/2019  . Pelvic pain 12/12/2018  . Common bile duct (CBD) obstruction   . Venous insufficiency of left lower extremity 07/04/2018  . Dyslipidemia 09/22/2017  . DDD (degenerative disc disease), cervical 04/03/2017  . DDD (degenerative disc disease), lumbar 04/03/2017  . Onychomycosis 03/21/2017  . Dysphagia 10/18/2016  . Encounter for chronic pain management 10/18/2016  . Biliary stasis 09/26/2016  . Fever blister 08/18/2016  . HNP (herniated nucleus pulposus), lumbar 09/23/2015  . Sinus congestion 07/30/2015  .  Iron deficiency  07/30/2015  . Health maintenance examination 06/15/2015  . 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 Headache 03/09/2015  . Clavicle enlargement 12/13/2014  . Prediabetes 12/13/2014  . Plantar fasciitis, bilateral 09/11/2014  . Advanced care planning/counseling discussion 06/10/2014  . Medicare annual wellness visit, subsequent 06/10/2014  . Abnormal thyroid function test 06/10/2014  . Chronic pain syndrome 06/10/2014  . CFS (chronic fatigue syndrome) 04/08/2014  . Postmenopausal atrophic vaginitis 10/19/2012  . 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 Cervical disc disorder with radiculopathy of cervical region 05/28/2010  . CHEST PAIN UNSPECIFIED 02/28/2008  . APHTHOUS ULCERS 01/31/2008  . Chronic insomnia 11/09/2007  . Drug-induced constipation 10/11/2007  . Allergic rhinitis 04/12/2007  . MDD (major depressive disorder), recurrent episode, moderate ([place]) 03/05/2007  . [name] syndrome 03/05/2007  . GERD 03/05/2007  . ROSACEA 03/05/2007  . NEURALGIA 03/05/2007  . 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 Chronic interstitial cystitis 12/06/2006  . Fibromyalgia 12/06/2006    Debbe Odea ,PT,DPT 08/04/2020, 12:37 PM  [facility] [address] [place], [place], [address] Phone: [phone]   Fax:  [phone]  Name: Rachel Duran MRN: [id number] Date of Birth: [birth year]

## 2020-08-04 NOTE — Telephone Encounter (Addendum)
Received mailed PA approval, valid 07/25/2020- 07/24/2021. Septimius.[place] #:  [id number]]  Notified pt via [place].

## 2020-08-06 ENCOUNTER — Encounter: Payer: Medicare Other | Admitting: Physical Therapy

## 2020-08-10 ENCOUNTER — Other Ambulatory Visit: Payer: Self-pay | Admitting: Family Medicine

## 2020-08-10 ENCOUNTER — Ambulatory Visit: Payer: Medicare Other | Admitting: Orthopedic Surgery

## 2020-08-10 ENCOUNTER — Other Ambulatory Visit: Payer: Self-pay | Admitting: Rheumatology

## 2020-08-11 ENCOUNTER — Other Ambulatory Visit: Payer: Self-pay

## 2020-08-11 ENCOUNTER — Encounter: Payer: Self-pay | Admitting: Rehabilitative and Restorative Service Providers"

## 2020-08-11 ENCOUNTER — Ambulatory Visit (INDEPENDENT_AMBULATORY_CARE_PROVIDER_SITE_OTHER): Payer: Medicare Other | Admitting: Rehabilitative and Restorative Service Providers"

## 2020-08-11 DIAGNOSIS — R600 Localized edema: Secondary | ICD-10-CM | POA: Diagnosis not present

## 2020-08-11 DIAGNOSIS — M6281 Muscle weakness (generalized): Secondary | ICD-10-CM

## 2020-08-11 DIAGNOSIS — M25661 Stiffness of right knee, not elsewhere classified: Secondary | ICD-10-CM

## 2020-08-11 DIAGNOSIS — R2689 Other abnormalities of gait and mobility: Secondary | ICD-10-CM | POA: Diagnosis not present

## 2020-08-11 DIAGNOSIS — R262 Difficulty in walking, not elsewhere classified: Secondary | ICD-10-CM

## 2020-08-11 DIAGNOSIS — M25561 Pain in right knee: Secondary | ICD-10-CM

## 2020-08-11 NOTE — Telephone Encounter (Signed)
Name of Medication: MSIR Name of Pharmacy: [facility] or Written Date and Quantity: 06-30-20 #90 Last Office Visit and Type: 07-10-20 AWV Next Office Visit and Type: 10-13-20 Last Controlled Substance Agreement Date: 04-19-19 Last UDS: 04-19-19

## 2020-08-11 NOTE — Therapy (Signed)
[facility] [address] [place], [place], [address] Phone: [phone]   Fax:  [phone]  Physical Therapy Treatment/Progress Note  Patient Details  Name: Rachel Duran MRN: [id number] Date of Birth: May 17, 1956 Referring Provider (PT): Marlou Sa Tonna Corner, MD   Encounter Date: 08/11/2020   Progress Note Reporting Period 06/25/2020 to 08/11/2020  See note below for Objective Data and Assessment of Progress/Goals.        PT End of Session - 08/11/20 1057    Visit Number 10    Number of Visits 16    Date for PT Re-Evaluation 08/20/20    Progress Note Due on Visit 16    PT Start Time 1050    PT Stop Time 1130    PT Time Calculation (min) 40 min    Activity Tolerance Patient tolerated treatment well;No increased pain    Behavior During Therapy WFL for tasks assessed/performed           Past Medical History:  Diagnosis Date  . Abdominal pain last 4 months   and nausea also  . Allergy   . Anemia    history of  . Anxiety   . Bipolar disorder ([place])    atpical bipolar disorder  . 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 C6- C7 -hx. past fusion(bone graft used)  . Cholecystitis   . Chronic pain   . DDD (degenerative disc disease), lumbar 09/2015   dextroscoliosis with multilevel DDD and facet arthrosis most notable for R foraminal disc protrusion L4/5 producing severe R neural foraminal stenosis abutting R L4 nerve root, moderate spinal canal and mild lat recesss and R neural foraminal stenosis L3/4 (MRI)  . Depression    bipolar depression  . Disorders of porphyrin metabolism   . Felon of finger of left hand 11/10/2016  . Fibromyalgia   . GERD (gastroesophageal reflux disease)   . Headache    occasionally  . Hypertension   . Internal hemorrhoids   . Interstitial cystitis 06-06-12   hx.  . Irritable bowel syndrome   . PONV (postoperative nausea and vomiting)    now uses stomach blockers and no ponv  .  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   Evaluated in Pulmonary clinic/ [facility]/ [place] /  02/07/12 > referred to Health Dept 02/10/2012     - POS GOLD    01/31/2012    . Raynauds disease    hx.  . Seronegative arthritis    [name]  . 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 ANTERIOR CERVICAL DECOMP/DISCECTOMY FUSION  2004   C5/6, C6/7  . ANTERIOR CERVICAL DECOMP/DISCECTOMY FUSION  02/2016   C3/4, C4/5 with plating [name])  . AUGMENTATION MAMMAPLASTY Bilateral 03/25/2010  . BREAST ENHANCEMENT SURGERY  2010  . BREAST IMPLANT EXCHANGE  10/2014   exchange saline implants, B mastopexy/capsulorraphy ([facility]  . BUNIONECTOMY Bilateral yrs ago  . [place]   x 1  . CHOLECYSTECTOMY  06/11/2012   Procedure: LAPAROSCOPIC CHOLECYSTECTOMY WITH INTRAOPERATIVE CHOLANGIOGRAM;  Surgeon: [name], MD,FACS;  Location: [facility];  Service: General;  Laterality: N/A;  . COLONOSCOPY  02/2018   done for positive cologuard - WNL, rpt 10 yrs Fuller Plan)  . CYSTOSCOPY    . ERCP  05/22/2012   Procedure: ENDOSCOPIC RETROGRADE CHOLANGIOPANCREATOGRAPHY (ERCP);  Surgeon: [name], MD,FACG;  Location: [facility];  Service: Endoscopy;  Laterality: N/A;  . ERCP N/A 09/17/2013   Procedure: ENDOSCOPIC RETROGRADE CHOLANGIOPANCREATOGRAPHY (ERCP);  Surgeon: [name], MD;  Location: [facility];  Service: Endoscopy;  Laterality: N/A;  . ERCP N/A 09/27/2018   Procedure: ENDOSCOPIC RETROGRADE CHOLANGIOPANCREATOGRAPHY (ERCP);  Surgeon: [name], MD;  Location: [facility];  Service: Endoscopy;  Laterality: N/A;  . ESOPHAGOGASTRODUODENOSCOPY  09/2016   WNL. esophagus dilated Fuller Plan)  . HEMORRHOID BANDING  09-23-13   --Dr. Greer Pickerel  . HERNIA REPAIR     inguinal  . HYSTEROSCOPY W/ ENDOMETRIAL ABLATION    . NASAL SINUS SURGERY     x5  . PARTIAL KNEE ARTHROPLASTY  Right 06/01/2020   Procedure: Right knee patellofemoral replacement;  Surgeon: [name], MD;  Location: [place];  Service: Orthopedics;  Laterality: Right;  . REMOVAL OF STONES  09/27/2018   Procedure: REMOVAL OF STONES;  Surgeon: [name], MD;  [facility];  Service: Endoscopy;;  . [name]  09/27/2018   Procedure: SPHINCTEROTOMY;  Surgeon: [name], MD;  [facility];  Service: Endoscopy;;  . TOTAL HIP ARTHROPLASTY Left 03/12/2019   Procedure: LEFT TOTAL HIP ARTHROPLASTY ANTERIOR APPROACH;  [name], [name], MD)  . UPPER GASTROINTESTINAL ENDOSCOPY      There were no vitals filed for this visit.   Subjective Assessment - 08/11/20 1052    Subjective Pt. stated chief complaint of back of knee 6/10 at worst, noted c stepping over or down on Rt LE.  Pt. indicated symptoms in back of knee started about 3-4 weeks after surgery.  Pt. stated "a little bit of anterior knee pain"  Pt. reported about 80% overall improvement to normal at this time.    How long can you stand comfortably? she thinks one hour    Patient Stated Goals be able to walk her dogs 2 miles    Currently in Pain? Yes    Pain Score 6    at worst   Pain Location Knee    Pain Orientation Right;Posterior    Pain Descriptors / Indicators Tightness;Pressure    Pain Type Surgical pain    Pain Onset More than a month ago    Aggravating Factors  posterior Rt knee : stepping over/down on Rt LE.  TIghtness in Rt anterior knee at times    Pain Relieving Factors uncertain for helping back of knee              [facility] PT Assessment - 08/11/20 0001      Assessment   Medical Diagnosis Right knee patellofemoral replacement on 06/01/20.     Referring Provider (PT) [name], MD    Onset Date/Surgical Date 08/20/20      Observation/Other Assessments   Focus on Therapeutic Outcomes (FOTO)  update 57%      Functional Tests   Functional tests Single leg  stance      Single Leg Stance   Comments Rt 15 seconds      AROM   Right Knee Extension 0    Right Knee Flexion 130      Strength   Right Hip Flexion 5/5    Right Knee Flexion 5/5    Right Knee Extension 5/5                         [facility] Adult PT Treatment/Exercise - 08/11/20 0001      Neuro Re-ed    Neuro Re-ed Details  SLS  15 sec x 2 on Rt LE, fitter wobble board fwd/back 30x each occasional HHA, heel toe ambulation on foam in // bars fwd/back 8 ft x 6 each way      Knee/Hip Exercises: Stretches   Gastroc Stretch 30 seconds;5 reps;Right   Runner stretch on incline board     Knee/Hip Exercises: Aerobic   Recumbent Bike Seat 5 10 mins Lvl 3 (extension focus)      Knee/Hip Exercises: Machines for Strengthening   Cybex Leg Press 37 lbs 3 x 10 SL, performed bilateral      Knee/Hip Exercises: Standing   Lateral Step Up 20 reps;Both;Step Height: 6"   eccentric focus                   PT Short Term Goals - 07/08/20 1713      PT SHORT TERM GOAL #1   Title Be independent with initial home exercise program for self-management of symptoms.    Time 4    Period Weeks    Status Achieved    Target Date 07/23/20             PT Long Term Goals - 08/11/20 1120      PT LONG TERM GOAL #1   Title Pt will improve FOTO score to at least 57%    Time 8    Period Weeks    Status Achieved      PT LONG TERM GOAL #2   Title She will be able to ambulate community distances no AD  and stairs with less than 3/10 pain    Time 8    Period Weeks    Status Partially Met    Target Date 08/20/20      PT LONG TERM GOAL #3   Title Improve Rt knee strength to 5/5 for improved ability to allow patient to complete valued functional tasks such as gardening, and walking dogs with less difficulty.    Baseline now 4+    Time 8    Period Weeks    Status Achieved      PT LONG TERM GOAL #4   Title Improve Rt knee ROM 0-130 deg    Baseline now 1-130    Time 8     Period Weeks    Status Achieved      PT LONG TERM GOAL #5   Title improve 5TSTS test to 12 seconds without UE support    Baseline 14    Time 8    Period Weeks    Status On-going    Target Date 08/20/20                 Plan - 08/11/20 1059    Clinical Impression Statement Pt. has attended 10 visits overall during course of treatment, reporting 80% overall improvement.  See objective data for updated information for current presentation.  Pt. has demonstrated improvement towards goals at this time as documented but does continue to report posterior knee symptoms, onset 3-4 weeks post surgery, that seem to create most difficulty in functional activity.  Pt. may continue to benefit from skilled PT services at this time.    Examination-Activity Limitations Stairs;Stand;Lift;Locomotion Level    Examination-Participation Restrictions Cleaning;Driving;Community Activity;Shop;Laundry    Stability/Clinical Decision Making Stable/Uncomplicated    Rehab Potential Excellent    PT Frequency 2x / week    PT Duration 8 weeks   6-8   PT Treatment/Interventions ADLs/Self Care Home Management;Cryotherapy;Electrical Stimulation;Iontophoresis 33m/ml Dexamethasone;Moist Heat;Ultrasound;Gait training;Stair training;Therapeutic activities;Therapeutic  exercise;Balance training;Neuromuscular re-education;Manual techniques;Scar mobilization;Passive range of motion;Dry needling;Joint Manipulations;Vasopneumatic Device;Taping    PT Next Visit Plan Rt knee strength, gait, balance, stair navigation, eccentric strengthening    PT Home Exercise Plan Access Code: [email or web address](balance and weight machines)    Consulted and Agree with Plan of Care Patient           Patient will benefit from skilled therapeutic intervention in order to improve the following deficits and impairments:  Abnormal gait,Decreased activity tolerance,Decreased balance,Decreased mobility,Decreased endurance,Decreased range of  motion,Decreased strength,Decreased scar mobility,Difficulty walking,Hypomobility,Increased edema,Impaired flexibility,Increased muscle spasms,Pain  Visit Diagnosis: Stiffness of right knee, not elsewhere classified  Other abnormalities of gait and mobility  Localized edema  Muscle weakness (generalized)  Difficulty in walking, not elsewhere classified  Acute pain of right knee     Problem List Patient Active Problem List   Diagnosis Date Noted  . Osteopenia 07/02/2020  . S/P knee surgery 06/01/2020  . Pre-op evaluation 05/27/2020  . Pulmonary nodule 05/27/2020  . Scleroderma ([place]) 05/05/2020  . Chronic patellofemoral pain of right knee 05/05/2020  . Positive ANA (antinuclear antibody) 06/05/2019  . 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  . Unilateral primary osteoarthritis, left hip 01/28/2019  . Pelvic pain 12/12/2018  . Common bile duct (CBD) obstruction   . Venous insufficiency of left lower extremity 07/04/2018  . Dyslipidemia 09/22/2017  . DDD (degenerative disc disease), cervical 04/03/2017  . DDD (degenerative disc disease), lumbar 04/03/2017  . Onychomycosis 03/21/2017  . Dysphagia 10/18/2016  . Encounter for chronic pain management 10/18/2016  . Biliary stasis 09/26/2016  . Fever blister 08/18/2016  . HNP (herniated nucleus pulposus), lumbar 09/23/2015  . Sinus congestion 07/30/2015  . Iron deficiency 07/30/2015  . Health maintenance examination 06/15/2015  . 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 Headache 03/09/2015  . Clavicle enlargement 12/13/2014  . Prediabetes 12/13/2014  . Plantar fasciitis, bilateral 09/11/2014  . Advanced care planning/counseling discussion 06/10/2014  . Medicare annual wellness visit, subsequent 06/10/2014  . Abnormal thyroid function test 06/10/2014  . Chronic pain syndrome 06/10/2014  . CFS (chronic fatigue syndrome) 04/08/2014  . Postmenopausal atrophic vaginitis 10/19/2012  . Positive QuantiFERON-TB Gold test  02/07/2012  . Cervical disc disorder with radiculopathy of cervical region 05/28/2010  . CHEST PAIN UNSPECIFIED 02/28/2008  . APHTHOUS ULCERS 01/31/2008  . Chronic insomnia 11/09/2007  . Drug-induced constipation 10/11/2007  . Allergic rhinitis 04/12/2007  . MDD (major depressive disorder), recurrent episode, moderate ([place]) 03/05/2007  . [name] syndrome 03/05/2007  . GERD 03/05/2007  . ROSACEA 03/05/2007  . NEURALGIA 03/05/2007  . 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 Chronic interstitial cystitis 12/06/2006  . Fibromyalgia 12/06/2006    Scot Jun, PT, DPT, OCS, ATC 08/11/20  11:25 AM    [facility] [address] [place], [place], [address] Phone: [phone]   Fax:  [phone]  Name: Rachel Duran MRN: [id number] Date of Birth: 21-Nov-1955

## 2020-08-12 NOTE — Telephone Encounter (Signed)
ERx 

## 2020-08-13 ENCOUNTER — Ambulatory Visit (INDEPENDENT_AMBULATORY_CARE_PROVIDER_SITE_OTHER): Payer: Medicare Other | Admitting: Physical Therapy

## 2020-08-13 ENCOUNTER — Other Ambulatory Visit: Payer: Self-pay

## 2020-08-13 DIAGNOSIS — M25661 Stiffness of right knee, not elsewhere classified: Secondary | ICD-10-CM | POA: Diagnosis not present

## 2020-08-13 DIAGNOSIS — R600 Localized edema: Secondary | ICD-10-CM | POA: Diagnosis not present

## 2020-08-13 DIAGNOSIS — R2689 Other abnormalities of gait and mobility: Secondary | ICD-10-CM

## 2020-08-13 DIAGNOSIS — M25561 Pain in right knee: Secondary | ICD-10-CM

## 2020-08-13 DIAGNOSIS — M6281 Muscle weakness (generalized): Secondary | ICD-10-CM | POA: Diagnosis not present

## 2020-08-13 DIAGNOSIS — R262 Difficulty in walking, not elsewhere classified: Secondary | ICD-10-CM

## 2020-08-13 NOTE — Therapy (Signed)
[facility] [address] [place], [place], [address] Phone: [phone]   Fax:  [phone]  Physical Therapy Treatment  Patient Details  Name: Rachel Duran MRN: [id number] Date of Birth: [birth year] Referring Provider (PT): Marlou Sa Tonna Corner, MD   Encounter Date: 08/13/2020   PT End of Session - 08/13/20 1252    Visit Number 11    Number of Visits 16    Date for PT Re-Evaluation 08/20/20    Progress Note Due on Visit 16    PT Start Time 0370    PT Stop Time 1230    PT Time Calculation (min) 46 min    Activity Tolerance Patient tolerated treatment well;No increased pain    Behavior During Therapy WFL for tasks assessed/performed           Past Medical History:  Diagnosis Date  . Abdominal pain last 4 months   and nausea also  . Allergy   . Anemia    history of  . Anxiety   . Bipolar disorder ([place])    atpical bipolar disorder  . 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 C6- C7 -hx. past fusion(bone graft used)  . Cholecystitis   . Chronic pain   . DDD (degenerative disc disease), lumbar 09/2015   dextroscoliosis with multilevel DDD and facet arthrosis most notable for R foraminal disc protrusion L4/5 producing severe R neural foraminal stenosis abutting R L4 nerve root, moderate spinal canal and mild lat recesss and R neural foraminal stenosis L3/4 (MRI)  . Depression    bipolar depression  . Disorders of porphyrin metabolism   . Felon of finger of left hand 11/10/2016  . Fibromyalgia   . GERD (gastroesophageal reflux disease)   . Headache    occasionally  . Hypertension   . Internal hemorrhoids   . Interstitial cystitis 06-06-12   hx.  . Irritable bowel syndrome   . PONV (postoperative nausea and vomiting)    now uses stomach blockers and no ponv  . 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   Evaluated in Pulmonary clinic/ [facility]/ [place] /  02/07/12 > referred to Health Dept 02/10/2012      - POS GOLD    01/31/2012    . Raynauds disease    hx.  . Seronegative arthritis    [name]  . 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 ANTERIOR CERVICAL DECOMP/DISCECTOMY FUSION  2004   C5/6, C6/7  . ANTERIOR CERVICAL DECOMP/DISCECTOMY FUSION  02/2016   C3/4, C4/5 with plating [name])  . AUGMENTATION MAMMAPLASTY Bilateral 03/25/2010  . BREAST ENHANCEMENT SURGERY  2010  . BREAST IMPLANT EXCHANGE  10/2014   exchange saline implants, B mastopexy/capsulorraphy ([facility])  . BUNIONECTOMY Bilateral yrs ago  . [place]   x 1  . CHOLECYSTECTOMY  06/11/2012   Procedure: LAPAROSCOPIC CHOLECYSTECTOMY WITH INTRAOPERATIVE CHOLANGIOGRAM;  Surgeon: [name], MD,FACS;  Location: [facility];  Service: General;  Laterality: N/A;  . COLONOSCOPY  02/2018   done for positive cologuard - WNL, rpt 10 yrs Fuller Plan)  . CYSTOSCOPY    . ERCP  05/22/2012   Procedure: ENDOSCOPIC RETROGRADE CHOLANGIOPANCREATOGRAPHY (ERCP);  Surgeon: [name], MD,FACG;  Location: [facility];  Service: Endoscopy;  Laterality: N/A;  . ERCP N/A 09/17/2013   Procedure: ENDOSCOPIC RETROGRADE CHOLANGIOPANCREATOGRAPHY (ERCP);  Surgeon: [name], MD;  Location: [facility];  Service:  Endoscopy;  Laterality: N/A;  . ERCP N/A 09/27/2018   Procedure: ENDOSCOPIC RETROGRADE CHOLANGIOPANCREATOGRAPHY (ERCP);  Surgeon: [name], MD;  Location: [facility];  Service: Endoscopy;  Laterality: N/A;  . ESOPHAGOGASTRODUODENOSCOPY  09/2016   WNL. esophagus dilated Fuller Plan)  . HEMORRHOID BANDING  09-23-13   --Dr. Greer Pickerel  . HERNIA REPAIR     inguinal  . HYSTEROSCOPY W/ ENDOMETRIAL ABLATION    . NASAL SINUS SURGERY     x5  . PARTIAL KNEE ARTHROPLASTY Right 06/01/2020   Procedure: Right knee patellofemoral replacement;  Surgeon: [name], MD;  Location: [place];  Service:  Orthopedics;  Laterality: Right;  . REMOVAL OF STONES  09/27/2018   Procedure: REMOVAL OF STONES;  Surgeon: [name], MD;  [facility];  Service: Endoscopy;;  . [name]  09/27/2018   Procedure: SPHINCTEROTOMY;  Surgeon: [name], MD;  [facility];  Service: Endoscopy;;  . TOTAL HIP ARTHROPLASTY Left 03/12/2019   Procedure: LEFT TOTAL HIP ARTHROPLASTY ANTERIOR APPROACH;  [name], [name], MD)  . UPPER GASTROINTESTINAL ENDOSCOPY      There were no vitals filed for this visit.   Subjective Assessment - 08/13/20 1151    Subjective Relays she is doing well overall, the only complaint is continued posterior knee pain.    How long can you stand comfortably? she thinks one hour    Patient Stated Goals be able to walk her dogs 2 miles    Pain Onset More than a month ago                             [facility] Adult PT Treatment/Exercise - 08/13/20 0001      Knee/Hip Exercises: Aerobic   Nustep 10 min L5      Knee/Hip Exercises: Standing   Other Standing Knee Exercises toe touches and calf raises with active compression to gastroc/hamsting attachment X 10 reps ea      Modalities   Modalities Ultrasound;Iontophoresis      Ultrasound   Ultrasound Location Rt posterior knee    Ultrasound Parameters 1.5 w/cm2, 1.0 mhz, 100% for 10 min with biofreeze    Ultrasound Goals Pain      Iontophoresis   Type of Iontophoresis Dexamethasone    Location Rt posterior knee    Dose 1.0 CC    Time 4-6 hour wear home patch      Manual Therapy   Manual therapy comments MET contract relax stretching to hamstring                    PT Short Term Goals - 07/08/20 1713      PT SHORT TERM GOAL #1   Title Be independent with initial home exercise program for self-management of symptoms.    Time 4    Period Weeks    Status Achieved    Target Date 07/23/20             PT Long Term Goals - 08/11/20 1120      PT LONG TERM GOAL  #1   Title Pt will improve FOTO score to at least 57%    Time 8    Period Weeks    Status Achieved      PT LONG TERM GOAL #2   Title She will be able to ambulate community distances no AD  and stairs with less than 3/10 pain    Time  8    Period Weeks    Status Partially Met    Target Date 08/20/20      PT LONG TERM GOAL #3   Title Improve Rt knee strength to 5/5 for improved ability to allow patient to complete valued functional tasks such as gardening, and walking dogs with less difficulty.    Baseline now 4+    Time 8    Period Weeks    Status Achieved      PT LONG TERM GOAL #4   Title Improve Rt knee ROM 0-130 deg    Baseline now 1-130    Time 8    Period Weeks    Status Achieved      PT LONG TERM GOAL #5   Title improve 5TSTS test to 12 seconds without UE support    Baseline 14    Time 8    Period Weeks    Status On-going    Target Date 08/20/20                 Plan - 08/13/20 1254    Clinical Impression Statement She is doing very well with Rt knee ROM, strength, and gait however continues to have posterior knee pain at her gastroc origin/hamstring insertion that could be tendonitis but also could not rule out bakers cyst. She will follow up with MD tommorow about this. Trialed modalaties and MET contract relax stretching in efforts to reduce overall pain and tightness and she was cautioned to modify reduce activity until pain/tightness improves. She does relay some relief following session.    Examination-Activity Limitations Stairs;Stand;Lift;Locomotion Level    Examination-Participation Restrictions Cleaning;Driving;Community Activity;Shop;Laundry    Stability/Clinical Decision Making Stable/Uncomplicated    Rehab Potential Excellent    PT Frequency 2x / week    PT Duration 8 weeks   6-8   PT Treatment/Interventions ADLs/Self Care Home Management;Cryotherapy;Electrical Stimulation;Iontophoresis 4mg /ml Dexamethasone;Moist Heat;Ultrasound;Gait training;Stair  training;Therapeutic activities;Therapeutic exercise;Balance training;Neuromuscular re-education;Manual techniques;Scar mobilization;Passive range of motion;Dry needling;Joint Manipulations;Vasopneumatic Device;Taping    PT Next Visit Plan what did MD say, how was ionto/US after last visit? Rt knee strength, gait, balance, stair navigation, eccentric strengthening    PT Home Exercise Plan Access Code: [email or web address](balance and weight machines)    Consulted and Agree with Plan of Care Patient           Patient will benefit from skilled therapeutic intervention in order to improve the following deficits and impairments:  Abnormal gait,Decreased activity tolerance,Decreased balance,Decreased mobility,Decreased endurance,Decreased range of motion,Decreased strength,Decreased scar mobility,Difficulty walking,Hypomobility,Increased edema,Impaired flexibility,Increased muscle spasms,Pain  Visit Diagnosis: Stiffness of right knee, not elsewhere classified  Other abnormalities of gait and mobility  Localized edema  Muscle weakness (generalized)  Difficulty in walking, not elsewhere classified  Acute pain of right knee     Problem List Patient Active Problem List   Diagnosis Date Noted  . Osteopenia 07/02/2020  . S/P knee surgery 06/01/2020  . Pre-op evaluation 05/27/2020  . Pulmonary nodule 05/27/2020  . Scleroderma ([place]) 05/05/2020  . Chronic patellofemoral pain of right knee 05/05/2020  . Positive ANA (antinuclear antibody) 06/05/2019  . 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  . Unilateral primary osteoarthritis, left hip 01/28/2019  . Pelvic pain 12/12/2018  . Common bile duct (CBD) obstruction   . Venous insufficiency of left lower extremity 07/04/2018  . Dyslipidemia 09/22/2017  . DDD (degenerative disc disease), cervical 04/03/2017  . DDD (degenerative disc disease), lumbar 04/03/2017  . Onychomycosis 03/21/2017  . Dysphagia 10/18/2016  .  Encounter for  chronic pain management 10/18/2016  . Biliary stasis 09/26/2016  . Fever blister 08/18/2016  . HNP (herniated nucleus pulposus), lumbar 09/23/2015  . Sinus congestion 07/30/2015  . Iron deficiency 07/30/2015  . Health maintenance examination 06/15/2015  . 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 Headache 03/09/2015  . Clavicle enlargement 12/13/2014  . Prediabetes 12/13/2014  . Plantar fasciitis, bilateral 09/11/2014  . Advanced care planning/counseling discussion 06/10/2014  . Medicare annual wellness visit, subsequent 06/10/2014  . Abnormal thyroid function test 06/10/2014  . Chronic pain syndrome 06/10/2014  . CFS (chronic fatigue syndrome) 04/08/2014  . Postmenopausal atrophic vaginitis 10/19/2012  . 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 Cervical disc disorder with radiculopathy of cervical region 05/28/2010  . CHEST PAIN UNSPECIFIED 02/28/2008  . APHTHOUS ULCERS 01/31/2008  . Chronic insomnia 11/09/2007  . Drug-induced constipation 10/11/2007  . Allergic rhinitis 04/12/2007  . MDD (major depressive disorder), recurrent episode, moderate ([place]) 03/05/2007  . [name] syndrome 03/05/2007  . GERD 03/05/2007  . ROSACEA 03/05/2007  . NEURALGIA 03/05/2007  . 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 Chronic interstitial cystitis 12/06/2006  . Fibromyalgia 12/06/2006    Silvestre Mesi 08/13/2020, 12:57 PM  [facility] Therapy [address] [place], [place], [address] Phone: [phone]   Fax:  [phone]  Name: Clemma Johnsen MRN: [id number] Date of Birth: 1956-06-25

## 2020-08-14 ENCOUNTER — Encounter: Payer: Self-pay | Admitting: Orthopedic Surgery

## 2020-08-14 ENCOUNTER — Ambulatory Visit (INDEPENDENT_AMBULATORY_CARE_PROVIDER_SITE_OTHER): Payer: Medicare Other | Admitting: Orthopedic Surgery

## 2020-08-14 ENCOUNTER — Ambulatory Visit (INDEPENDENT_AMBULATORY_CARE_PROVIDER_SITE_OTHER): Payer: Medicare Other

## 2020-08-14 DIAGNOSIS — M2241 Chondromalacia patellae, right knee: Secondary | ICD-10-CM | POA: Diagnosis not present

## 2020-08-14 NOTE — Progress Notes (Signed)
Post-Op Visit Note   Patient: Rachel Duran           Date of Birth: [birth year]           MRN: [id number] Visit Date: 08/14/2020 PCP: Ria Bush, MD   Assessment & Plan:  Chief Complaint:  Chief Complaint  Patient presents with   Right Knee - Pain   Visit Diagnoses:  1. Chondromalacia patellae, right knee     Plan: Rachel Duran is a 65 year old patient who is now almost 3 months out right knee patellofemoral replacement.  She has been doing well but been having a little bit of posterior pain.  This was helped with kinesiology tape.  Denies any calf swelling.  She has been very active doing exercises as well as physical therapy.  On exam there is negative Homans no calf tenderness.  She has a little bit of tenderness to palpation on the hamstring tendons themselves with resisted flexion.  No effusion in the knee with excellent range of motion.  Lacks about 2 degrees of full extension on the right compared to the left.  Radiographs look reasonable.  Impression is probable tendinitis type pain in that right knee with no evidence of blood clot or structural problem.  No effusion in the knee.  I would favor observation for now.  Follow-up as needed.  Otherwise she is doing very well with her patellofemoral replacement.  None of her symptoms are really around the anterior knee at this time which is encouraging.  Follow-Up Instructions: Return if symptoms worsen or fail to improve.   Orders:  Orders Placed This Encounter  Procedures   XR Knee 1-2 Views Right   No orders of the defined types were placed in this encounter.   Imaging: XR Knee 1-2 Views Right  Result Date: 08/14/2020 AP lateral right knee reviewed.  Patellofemoral prosthesis in good position alignment with no complicating features.  Lateral and medial joint space is preserved   PMFS History: Patient Active Problem List   Diagnosis Date Noted   Osteopenia 07/02/2020   S/P knee surgery 06/01/2020    Pre-op evaluation 05/27/2020   Pulmonary nodule 05/27/2020   Scleroderma ([place]) 05/05/2020   Chronic patellofemoral pain of right knee 05/05/2020   Positive ANA (antinuclear antibody) 06/05/2019   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   Unilateral primary osteoarthritis, left hip 01/28/2019   Pelvic pain 12/12/2018   Common bile duct (CBD) obstruction    Venous insufficiency of left lower extremity 07/04/2018   Dyslipidemia 09/22/2017   DDD (degenerative disc disease), cervical 04/03/2017   DDD (degenerative disc disease), lumbar 04/03/2017   Onychomycosis 03/21/2017   Dysphagia 10/18/2016   Encounter for chronic pain management 10/18/2016   Biliary stasis 09/26/2016   Fever blister 08/18/2016   HNP (herniated nucleus pulposus), lumbar 09/23/2015   Sinus congestion 07/30/2015   Iron deficiency 07/30/2015   Health maintenance examination 06/15/2015   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   Headache 03/09/2015   Clavicle enlargement 12/13/2014   Prediabetes 12/13/2014   Plantar fasciitis, bilateral 09/11/2014   Advanced care planning/counseling discussion 06/10/2014   Medicare annual wellness visit, subsequent 06/10/2014   Abnormal thyroid function test 06/10/2014   Chronic pain syndrome 06/10/2014   CFS (chronic fatigue syndrome) 04/08/2014   Postmenopausal atrophic vaginitis 10/19/2012   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   Cervical disc disorder with radiculopathy of cervical region 05/28/2010   CHEST PAIN UNSPECIFIED 02/28/2008   APHTHOUS ULCERS 01/31/2008   Chronic insomnia 11/09/2007  Drug-induced constipation 10/11/2007   Allergic rhinitis 04/12/2007   MDD (major depressive disorder), recurrent episode, moderate ([place]) 03/05/2007   [name] syndrome 03/05/2007   GERD 03/05/2007   ROSACEA 03/05/2007   NEURALGIA 03/05/2007   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 C6- C7 -hx. past fusion(bone graft used)   Cholecystitis    Chronic pain    DDD (degenerative disc disease), lumbar 09/2015   dextroscoliosis with multilevel DDD and facet arthrosis most notable for R foraminal disc protrusion L4/5 producing severe R neural foraminal stenosis abutting R L4 nerve root, moderate spinal canal and mild lat recesss and R neural foraminal stenosis L3/4 (MRI)   Depression    bipolar depression   Disorders of porphyrin metabolism    Felon of finger of left hand 11/10/2016   Fibromyalgia    GERD (gastroesophageal reflux disease)    Headache    occasionally   Hypertension    Internal hemorrhoids    Interstitial cystitis 06-06-12   hx.   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 (Dr Baird Cancer retinologist)    Family History  Problem Relation Age of Onset   CAD Father 45       MI, nonsmoker   Esophageal cancer Father 98   Stomach cancer Father    Scleroderma Mother    Hypertension Mother    Esophageal cancer Paternal Grandfather    Stomach cancer Paternal Grandfather    Diabetes Maternal Grandmother    Arthritis Brother    Stroke Neg Hx    Colon cancer Neg Hx    Rectal cancer Neg Hx     Past Surgical History:  Procedure Laterality Date   ANTERIOR CERVICAL  DECOMP/DISCECTOMY FUSION  2004   C5/6, C6/7   ANTERIOR CERVICAL DECOMP/DISCECTOMY FUSION  02/2016   C3/4, C4/5 with plating [name])   AUGMENTATION MAMMAPLASTY Bilateral 03/25/2010   BREAST ENHANCEMENT SURGERY  2010   BREAST IMPLANT EXCHANGE  10/2014   exchange saline implants, B mastopexy/capsulorraphy ([facility])   BUNIONECTOMY Bilateral yrs ago   [place]   x 1   CHOLECYSTECTOMY  06/11/2012   Procedure: LAPAROSCOPIC CHOLECYSTECTOMY WITH INTRAOPERATIVE CHOLANGIOGRAM;  Surgeon: [name], MD,FACS;  [facility];  Service: General;  Laterality: N/A;   COLONOSCOPY  02/2018   done for positive cologuard - WNL, rpt 10 yrs Fuller Plan)   CYSTOSCOPY     ERCP  05/22/2012   Procedure: ENDOSCOPIC RETROGRADE CHOLANGIOPANCREATOGRAPHY (ERCP);  Surgeon: [name], MD,FACG;  Location: [facility];  Service: Endoscopy;  Laterality: N/A;   ERCP N/A 09/17/2013   Procedure: ENDOSCOPIC RETROGRADE CHOLANGIOPANCREATOGRAPHY (ERCP);  Surgeon: [name],  MD;  [facility];  Service: Endoscopy;  Laterality: N/A;   ERCP N/A 09/27/2018   Procedure: ENDOSCOPIC RETROGRADE CHOLANGIOPANCREATOGRAPHY (ERCP);  Surgeon: [name], MD;  Location: [facility];  Service: Endoscopy;  Laterality: N/A;   ESOPHAGOGASTRODUODENOSCOPY  09/2016   WNL. esophagus dilated Fuller Plan)   HEMORRHOID BANDING  09-23-13   --Dr. Greer Pickerel   HERNIA REPAIR     inguinal   HYSTEROSCOPY W/ ENDOMETRIAL ABLATION     NASAL SINUS SURGERY     x5   PARTIAL KNEE ARTHROPLASTY Right 06/01/2020   Procedure: Right knee patellofemoral replacement;  Surgeon: [name], MD;  Location: [facility];  Service: Orthopedics;  Laterality: Right;   REMOVAL OF STONES  09/27/2018   Procedure: REMOVAL OF STONES;  Surgeon: [name], MD;  [facility];  Service: Endoscopy;;   SPHINCTEROTOMY  09/27/2018   Procedure: SPHINCTEROTOMY;  Surgeon: [name], MD;   [facility];  Service: Endoscopy;;   TOTAL HIP ARTHROPLASTY Left 03/12/2019   Procedure: LEFT TOTAL HIP ARTHROPLASTY ANTERIOR APPROACH;  [name], [name], MD)   UPPER GASTROINTESTINAL ENDOSCOPY     Social History   Occupational History   Occupation: retired    Fish farm manager: UNEMPLOYED  Tobacco Use   Smoking status: Never Smoker   Smokeless tobacco: Never Used  Scientific laboratory technician Use: Never used  Substance and Sexual Activity   Alcohol use: No    Alcohol/week: 0.0 standard drinks   Drug use: No   Sexual activity: Yes    Partners: Male    Birth control/protection: Post-menopausal    Comment: vasectomy

## 2020-08-18 ENCOUNTER — Other Ambulatory Visit: Payer: Self-pay

## 2020-08-18 ENCOUNTER — Encounter: Payer: Self-pay | Admitting: Physical Therapy

## 2020-08-18 ENCOUNTER — Ambulatory Visit (INDEPENDENT_AMBULATORY_CARE_PROVIDER_SITE_OTHER): Payer: Medicare Other | Admitting: Physical Therapy

## 2020-08-18 DIAGNOSIS — M6281 Muscle weakness (generalized): Secondary | ICD-10-CM

## 2020-08-18 DIAGNOSIS — M25661 Stiffness of right knee, not elsewhere classified: Secondary | ICD-10-CM | POA: Diagnosis not present

## 2020-08-18 DIAGNOSIS — R262 Difficulty in walking, not elsewhere classified: Secondary | ICD-10-CM

## 2020-08-18 DIAGNOSIS — R600 Localized edema: Secondary | ICD-10-CM

## 2020-08-18 DIAGNOSIS — M25561 Pain in right knee: Secondary | ICD-10-CM

## 2020-08-18 DIAGNOSIS — R2689 Other abnormalities of gait and mobility: Secondary | ICD-10-CM

## 2020-08-18 NOTE — Therapy (Signed)
[facility] [address] [place], [place], [address] Phone: [phone]   Fax:  [phone]  Physical Therapy Treatment PHYSICAL THERAPY DISCHARGE SUMMARY  Visits from Start of Care: 12  Current functional level related to goals / functional outcomes: See below   Remaining deficits: none   Education / Equipment: See below Plan: Patient agrees to discharge.  Patient goals were met. Patient is being discharged due to meeting the stated rehab goals.  ?????      Patient Details  Name: Rachel Duran MRN: [id number] Date of Birth: 07-28-1955 Referring Provider (PT): Marlou Sa Tonna Corner, MD   Encounter Date: 08/18/2020   PT End of Session - 08/18/20 1226    Visit Number 12    Number of Visits 16    Date for PT Re-Evaluation 08/20/20    Progress Note Due on Visit 16    PT Start Time 1140    PT Stop Time 1226    PT Time Calculation (min) 46 min    Activity Tolerance Patient tolerated treatment well;No increased pain    Behavior During Therapy WFL for tasks assessed/performed           Past Medical History:  Diagnosis Date  . Abdominal pain last 4 months   and nausea also  . Allergy   . Anemia    history of  . Anxiety   . Bipolar disorder ([place])    atpical bipolar disorder  . 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 C6- C7 -hx. past fusion(bone graft used)  . Cholecystitis   . Chronic pain   . DDD (degenerative disc disease), lumbar 09/2015   dextroscoliosis with multilevel DDD and facet arthrosis most notable for R foraminal disc protrusion L4/5 producing severe R neural foraminal stenosis abutting R L4 nerve root, moderate spinal canal and mild lat recesss and R neural foraminal stenosis L3/4 (MRI)  . Depression    bipolar depression  . Disorders of porphyrin metabolism   . Felon of finger of left hand 11/10/2016  . Fibromyalgia   . GERD (gastroesophageal reflux disease)   . Headache    occasionally   . Hypertension   . Internal hemorrhoids   . Interstitial cystitis 06-06-12   hx.  . Irritable bowel syndrome   . PONV (postoperative nausea and vomiting)    now uses stomach blockers and no ponv  . 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   Evaluated in Pulmonary clinic/ [facility]/ [place] /  02/07/12 > referred to Health Dept 02/10/2012     - POS GOLD    01/31/2012    . Raynauds disease    hx.  . Seronegative arthritis    [name]  . 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 ANTERIOR CERVICAL DECOMP/DISCECTOMY FUSION  2004   C5/6, C6/7  . ANTERIOR CERVICAL DECOMP/DISCECTOMY FUSION  02/2016   C3/4, C4/5 with plating [name])  . AUGMENTATION MAMMAPLASTY Bilateral 03/25/2010  . BREAST ENHANCEMENT SURGERY  2010  . BREAST IMPLANT EXCHANGE  10/2014   exchange saline implants, B mastopexy/capsulorraphy ([facility])  . BUNIONECTOMY Bilateral yrs ago  . [place]   x 1  . CHOLECYSTECTOMY  06/11/2012   Procedure: LAPAROSCOPIC CHOLECYSTECTOMY WITH INTRAOPERATIVE CHOLANGIOGRAM;  Surgeon: [name], MD,FACS;  [facility];  Service: General;  Laterality: N/A;  . COLONOSCOPY  02/2018   done for positive cologuard -  WNL, rpt 10 yrs Fuller Plan)  . CYSTOSCOPY    . ERCP  05/22/2012   Procedure: ENDOSCOPIC RETROGRADE CHOLANGIOPANCREATOGRAPHY (ERCP);  Surgeon: [name], MD,FACG;  Location: [facility];  Service: Endoscopy;  Laterality: N/A;  . ERCP N/A 09/17/2013   Procedure: ENDOSCOPIC RETROGRADE CHOLANGIOPANCREATOGRAPHY (ERCP);  Surgeon: [name], MD;  Location: [facility];  Service: Endoscopy;  Laterality: N/A;  . ERCP N/A 09/27/2018   Procedure: ENDOSCOPIC RETROGRADE CHOLANGIOPANCREATOGRAPHY (ERCP);  Surgeon: [name], MD;  Location: [facility];  Service: Endoscopy;  Laterality: N/A;  . ESOPHAGOGASTRODUODENOSCOPY  09/2016   WNL.  esophagus dilated Fuller Plan)  . HEMORRHOID BANDING  09-23-13   --Dr. Greer Pickerel  . HERNIA REPAIR     inguinal  . HYSTEROSCOPY W/ ENDOMETRIAL ABLATION    . NASAL SINUS SURGERY     x5  . PARTIAL KNEE ARTHROPLASTY Right 06/01/2020   Procedure: Right knee patellofemoral replacement;  Surgeon: [name], MD;  Location: [facility];  Service: Orthopedics;  Laterality: Right;  . REMOVAL OF STONES  09/27/2018   Procedure: REMOVAL OF STONES;  Surgeon: [name], MD;  [facility];  Service: Endoscopy;;  . [name]  09/27/2018   Procedure: SPHINCTEROTOMY;  Surgeon: [name], MD;  [facility];  Service: Endoscopy;;  . TOTAL HIP ARTHROPLASTY Left 03/12/2019   Procedure: LEFT TOTAL HIP ARTHROPLASTY ANTERIOR APPROACH;  [name], [name], MD)  . UPPER GASTROINTESTINAL ENDOSCOPY      There were no vitals filed for this visit.   Subjective Assessment - 08/18/20 1136    Subjective Patient relays she is doing very well after seeing the Dr. She relays she has backed off stretchig a little bit but she is going to keep up with walking and biking and start going to the gym. She notes she feels ready to finish therapy and wants a home therapy routine to continue at her gym. She feels she has met all of her goals.    How long can you stand comfortably? she thinks one hour    Patient Stated Goals be able to walk her dogs 2 miles    Currently in Pain? No/denies    Pain Onset More than a month ago              [facility] PT Assessment - 08/18/20 0001      Strength   Right Hip Flexion 5/5    Right Knee Flexion 5/5    Right Knee Extension 5/5                         [facility] Adult PT Treatment/Exercise - 08/18/20 0001      Knee/Hip Exercises: Aerobic   Nustep 8 min L5      Knee/Hip Exercises: Machines for Strengthening   Cybex Knee Extension 10# 2x10 slow eccentrics    Cybex Knee Flexion 15 lbs 2x10    Cybex Leg Press bilat-75  lbs 2x10, SL bilat 37 lbs 2x10      Knee/Hip Exercises: Standing   Lateral Step Up Limitations lateral step down eccentric lowering 3x10                  PT Education - 08/18/20 1223    Education Details Updated HEP for continued work at home to maintain strength, activity modifications for walking dogs    Person(s) Educated Patient    Methods Explanation;Demonstration;Handout    Comprehension Verbalized understanding;Returned demonstration  PT Short Term Goals - 07/08/20 1713      PT SHORT TERM GOAL #1   Title Be independent with initial home exercise program for self-management of symptoms.    Time 4    Period Weeks    Status Achieved    Target Date 07/23/20             PT Long Term Goals - 08/18/20 1227      PT LONG TERM GOAL #1   Title Pt will improve FOTO score to at least 57%    Time 8    Period Weeks    Status Achieved      PT LONG TERM GOAL #2   Title She will be able to ambulate community distances no AD  and stairs with less than 3/10 pain    Time 8    Period Weeks    Status Achieved      PT LONG TERM GOAL #3   Title Improve Rt knee strength to 5/5 for improved ability to allow patient to complete valued functional tasks such as gardening, and walking dogs with less difficulty.    Baseline now 4+    Time 8    Period Weeks    Status Achieved      PT LONG TERM GOAL #4   Title Improve Rt knee ROM 0-130 deg    Baseline now 1-130    Time 8    Period Weeks    Status Achieved      PT LONG TERM GOAL #5   Title improve 5TSTS test to 12 seconds without UE support    Baseline 14    Time 8    Period Weeks    Status Achieved                 Plan - 08/18/20 1228    Clinical Impression Statement Patient is doing great and denies any pain. After follow up with Dr. she is feeling confident about discontinuing PT and feeling good about her outcome. She has met all goals and feeling confident she can continue and maintain her  strengthening program at her fitness center. Spent ample time making sure she is independent with machines and updated HEP to continue at home. Patient is ready for dc today as planned as she has no remaining strength or balance deficits and pain has not been a limiting factor. She is ready to transition to an independent program.    Examination-Activity Limitations Stairs;Stand;Lift;Locomotion Level    Examination-Participation Restrictions Cleaning;Driving;Community Activity;Shop;Laundry    Stability/Clinical Decision Making Stable/Uncomplicated    Rehab Potential Excellent    PT Frequency 2x / week    PT Duration 8 weeks   6-8   PT Treatment/Interventions ADLs/Self Care Home Management;Cryotherapy;Electrical Stimulation;Iontophoresis 49m/ml Dexamethasone;Moist Heat;Ultrasound;Gait training;Stair training;Therapeutic activities;Therapeutic exercise;Balance training;Neuromuscular re-education;Manual techniques;Scar mobilization;Passive range of motion;Dry needling;Joint Manipulations;Vasopneumatic Device;Taping    [facility]   Consulted and Agree with Plan of Care Patient           Patient will benefit from skilled therapeutic intervention in order to improve the following deficits and impairments:  Abnormal gait,Decreased activity tolerance,Decreased balance,Decreased mobility,Decreased endurance,Decreased range of motion,Decreased strength,Decreased scar mobility,Difficulty walking,Hypomobility,Increased edema,Impaired flexibility,Increased muscle spasms,Pain  Visit Diagnosis: Stiffness of right knee, not elsewhere classified  Other abnormalities of gait and mobility  Localized edema  Muscle weakness (generalized)  Difficulty in walking, not elsewhere classified  Acute pain of right knee     Problem List Patient Active  Problem List   Diagnosis Date Noted  . Osteopenia 07/02/2020  . S/P knee surgery 06/01/2020  . Pre-op evaluation 05/27/2020  . Pulmonary  nodule 05/27/2020  . Scleroderma ([place]) 05/05/2020  . Chronic patellofemoral pain of right knee 05/05/2020  . Positive ANA (antinuclear antibody) 06/05/2019  . 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  . Unilateral primary osteoarthritis, left hip 01/28/2019  . Pelvic pain 12/12/2018  . Common bile duct (CBD) obstruction   . Venous insufficiency of left lower extremity 07/04/2018  . Dyslipidemia 09/22/2017  . DDD (degenerative disc disease), cervical 04/03/2017  . DDD (degenerative disc disease), lumbar 04/03/2017  . Onychomycosis 03/21/2017  . Dysphagia 10/18/2016  . Encounter for chronic pain management 10/18/2016  . Biliary stasis 09/26/2016  . Fever blister 08/18/2016  . HNP (herniated nucleus pulposus), lumbar 09/23/2015  . Sinus congestion 07/30/2015  . Iron deficiency 07/30/2015  . Health maintenance examination 06/15/2015  . 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 Headache 03/09/2015  . Clavicle enlargement 12/13/2014  . Prediabetes 12/13/2014  . Plantar fasciitis, bilateral 09/11/2014  . Advanced care planning/counseling discussion 06/10/2014  . Medicare annual wellness visit, subsequent 06/10/2014  . Abnormal thyroid function test 06/10/2014  . Chronic pain syndrome 06/10/2014  . CFS (chronic fatigue syndrome) 04/08/2014  . Postmenopausal atrophic vaginitis 10/19/2012  . 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 Cervical disc disorder with radiculopathy of cervical region 05/28/2010  . CHEST PAIN UNSPECIFIED 02/28/2008  . APHTHOUS ULCERS 01/31/2008  . Chronic insomnia 11/09/2007  . Drug-induced constipation 10/11/2007  . Allergic rhinitis 04/12/2007  . MDD (major depressive disorder), recurrent episode, moderate ([place]) 03/05/2007  . [name] syndrome 03/05/2007  . GERD 03/05/2007  . ROSACEA 03/05/2007  . NEURALGIA 03/05/2007  . 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 Chronic interstitial cystitis 12/06/2006  . Fibromyalgia 12/06/2006     Glenetta Hew, SPT 08/18/2020, 12:38 PM  During this treatment session, this physical therapist was present, participating in and directing the treatment.   This note has been reviewed and this clinician agrees with the information provided.  Elsie Ra, PT, DPT 08/18/20 1:47 PM   [facility] [address] [place], [place], [address] Phone: [phone]   Fax:  [phone]  Name: Rachel Duran MRN: [id number] Date of Birth: 04-Jun-1956

## 2020-08-18 NOTE — Patient Instructions (Signed)
Access Code: [facility] URL: [email or web address] Date: 08/18/2020 Prepared by: Elsie Ra  Exercises Knee Extension with Weight Machine - 2 x daily - 6 x weekly - 2-3 sets - 10-20 reps Hamstring Curl with Weight Machine - 2 x daily - 6 x weekly - 2-3 sets - 10-20 reps Full Leg Press - 2 x daily - 6 x weekly - 2-3 sets - 10 reps Walking [place] - 2 x daily - 6 x weekly - 3 sets - 10 reps Lateral Step Down - 2 x daily - 6 x weekly - 3 sets - 10 reps Standing Squat with Resisted Terminal Knee Extension - 2 x daily - 6 x weekly - 3 sets - 10 reps Backward Step Up - 2 x daily - 6 x weekly - 2-3 sets - 10 reps

## 2020-08-20 ENCOUNTER — Encounter: Payer: Medicare Other | Admitting: Physical Therapy

## 2020-08-28 NOTE — Telephone Encounter (Addendum)
PA approval in CoverMyMeds, valid through 12/02/2020.

## 2020-09-03 ENCOUNTER — Other Ambulatory Visit: Payer: Self-pay | Admitting: Family Medicine

## 2020-09-03 NOTE — Telephone Encounter (Signed)
ERx 

## 2020-09-04 NOTE — Progress Notes (Signed)
Office Visit Note  Patient: Rachel Duran             Date of Birth: 1955-10-14           MRN: [id number]             PCP: Ria Bush, MD Referring: Ria Bush, MD Visit Date: 09/17/2020 Occupation: @GUAROCC @  Subjective:  [name]   History of Present Illness: Rachel Duran is a 65 y.o. female with history of limited systemic sclerosis, osteoarthritis, and DDD. She takes norvasc 5 mg 1.5 tablets by mouth daily.  She recently discontinued taking a baby aspirin as recommended by her PCP but she has noticed worsening symptoms of raynaud's since then.  She continues to experience symptoms of raynaud's about twice daily.  She denies any digital ulcers. She has not noticed any progression in skin tightness.  She continues to experience pain and stiffness in both hands.  Patient reports that in November 2021 she had a right patellofemoral replacement performed by Dr. Marlou Sa.  She states that she graduated from physical therapy in January 2022.  She states that she was diagnosed with tendonitis on 08/14/20 by Dr. Marlou Sa, which has caused persistent discomfort behind her right knee.  She has started to walk on a regular basis for exercise.  She has also been going to the fitness center at [facility].  Since her last office visit she had an appointment with Dr. Haroldine Laws and Dr. Vaughan Browner.  She states that she has an upcoming appointment with Dr. Fuller Plan at the end of March, and she will discuss scheduling a GI motility test at that time.  She continues to have chronic constipation and intermittent dysphagia.       Activities of Daily Living:  Patient reports morning stiffness for 2-2.5 hours.   Patient Reports nocturnal pain.  Difficulty dressing/grooming: Denies Difficulty climbing stairs: Reports Difficulty getting out of chair: Reports Difficulty using hands for taps, buttons, cutlery, and/or writing: Reports  Review of Systems  Constitutional: Positive for fatigue.   HENT: Positive for mouth dryness. Negative for mouth sores and nose dryness.   Eyes: Positive for dryness. Negative for pain and itching.  Respiratory: Negative for shortness of breath and difficulty breathing.   Cardiovascular: Negative for chest pain and palpitations.  Gastrointestinal: Positive for constipation. Negative for blood in stool and diarrhea.  Endocrine: Negative for increased urination.  Genitourinary: Negative for difficulty urinating.  Musculoskeletal: Positive for arthralgias, joint pain, joint swelling, myalgias, morning stiffness, muscle tenderness and myalgias.  Skin: Negative for color change, rash and redness.  Allergic/Immunologic: Negative for susceptible to infections.  Neurological: Negative for dizziness, numbness, headaches, memory loss and weakness.  Hematological: Positive for bruising/bleeding tendency.  Psychiatric/Behavioral: Negative for confusion.    PMFS History:  Patient Active Problem List   Diagnosis Date Noted  . Osteopenia 07/02/2020  . S/P knee surgery 06/01/2020  . Pre-op evaluation 05/27/2020  . Pulmonary nodule 05/27/2020  . Scleroderma ([place]) 05/05/2020  . Chronic patellofemoral pain of right knee 05/05/2020  . Positive ANA (antinuclear antibody) 06/05/2019  . 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  . Unilateral primary osteoarthritis, left hip 01/28/2019  . Pelvic pain 12/12/2018  . Common bile duct (CBD) obstruction   . Venous insufficiency of left lower extremity 07/04/2018  . Dyslipidemia 09/22/2017  . DDD (degenerative disc disease), cervical 04/03/2017  . DDD (degenerative disc disease), lumbar 04/03/2017  . Onychomycosis 03/21/2017  . Dysphagia 10/18/2016  . Encounter for chronic pain  management 10/18/2016  . Biliary stasis 09/26/2016  . Fever blister 08/18/2016  . HNP (herniated nucleus pulposus), lumbar 09/23/2015  . Sinus congestion 07/30/2015  . Iron deficiency 07/30/2015  . Health maintenance  examination 06/15/2015  . 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 Headache 03/09/2015  . Clavicle enlargement 12/13/2014  . Prediabetes 12/13/2014  . Plantar fasciitis, bilateral 09/11/2014  . Advanced care planning/counseling discussion 06/10/2014  . Medicare annual wellness visit, subsequent 06/10/2014  . Abnormal thyroid function test 06/10/2014  . Chronic pain syndrome 06/10/2014  . CFS (chronic fatigue syndrome) 04/08/2014  . Postmenopausal atrophic vaginitis 10/19/2012  . 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 Cervical disc disorder with radiculopathy of cervical region 05/28/2010  . CHEST PAIN UNSPECIFIED 02/28/2008  . APHTHOUS ULCERS 01/31/2008  . Chronic insomnia 11/09/2007  . Drug-induced constipation 10/11/2007  . Allergic rhinitis 04/12/2007  . MDD (major depressive disorder), recurrent episode, moderate ([place]) 03/05/2007  . [name] syndrome 03/05/2007  . GERD 03/05/2007  . ROSACEA 03/05/2007  . NEURALGIA 03/05/2007  . 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 Chronic interstitial cystitis 12/06/2006  . Fibromyalgia 12/06/2006    Past Medical History:  Diagnosis Date  . Abdominal pain last 4 months   and nausea also  . Allergy   . Anemia    history of  . Anxiety   . Bipolar disorder ([place])    atpical bipolar disorder  . 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 C6- C7 -hx. past fusion(bone graft used)  . Cholecystitis   . Chronic pain   . DDD (degenerative disc disease), lumbar 09/2015   dextroscoliosis with multilevel DDD and facet arthrosis most notable for R foraminal disc protrusion L4/5 producing severe R neural foraminal stenosis abutting R L4 nerve root, moderate spinal canal and mild lat recesss and R neural foraminal stenosis L3/4 (MRI)  . Depression    bipolar depression  . Disorders of porphyrin metabolism   . Felon of finger of left hand 11/10/2016  . Fibromyalgia   . GERD (gastroesophageal reflux disease)   .  Headache    occasionally  . Hypertension   . Internal hemorrhoids   . Interstitial cystitis 06-06-12   hx.  . Irritable bowel syndrome   . PONV (postoperative nausea and vomiting)    now uses stomach blockers and no ponv  . 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 Raynauds disease    hx.  . Seronegative arthritis    [name]  . 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 CAD Father 60       MI, nonsmoker  . Esophageal cancer Father 3  . Stomach cancer Father   . Scleroderma Mother   . Hypertension Mother   . Esophageal cancer Paternal Grandfather   . Stomach cancer Paternal Grandfather   . Diabetes Maternal Grandmother   . Arthritis Brother   . Stroke Neg Hx   . Colon cancer Neg Hx   . Rectal cancer Neg Hx    Past Surgical History:  Procedure Laterality Date  . ANTERIOR CERVICAL DECOMP/DISCECTOMY FUSION  2004   C5/6, C6/7  . ANTERIOR CERVICAL DECOMP/DISCECTOMY FUSION  02/2016   C3/4, C4/5 with plating [name])  . AUGMENTATION MAMMAPLASTY  Bilateral 03/25/2010  . BREAST ENHANCEMENT SURGERY  2010  . BREAST IMPLANT EXCHANGE  10/2014   exchange saline implants, B mastopexy/capsulorraphy ([facility])  . BUNIONECTOMY Bilateral yrs ago  . [place]   x 1  . CHOLECYSTECTOMY  06/11/2012   Procedure: LAPAROSCOPIC CHOLECYSTECTOMY WITH INTRAOPERATIVE CHOLANGIOGRAM;  Surgeon: [name], MD,FACS;  Location: [facility];  Service: General;  Laterality: N/A;  . COLONOSCOPY  02/2018   done for positive cologuard - WNL, rpt 10 yrs Fuller Plan)  . CYSTOSCOPY    . ERCP  05/22/2012   Procedure: ENDOSCOPIC RETROGRADE CHOLANGIOPANCREATOGRAPHY (ERCP);  Surgeon: [name], MD,FACG;  [facility];  Service: Endoscopy;   Laterality: N/A;  . ERCP N/A 09/17/2013   Procedure: ENDOSCOPIC RETROGRADE CHOLANGIOPANCREATOGRAPHY (ERCP);  Surgeon: [name], MD;  Location: [facility];  Service: Endoscopy;  Laterality: N/A;  . ERCP N/A 09/27/2018   Procedure: ENDOSCOPIC RETROGRADE CHOLANGIOPANCREATOGRAPHY (ERCP);  Surgeon: [name], MD;  Location: [facility];  Service: Endoscopy;  Laterality: N/A;  . ESOPHAGOGASTRODUODENOSCOPY  09/2016   WNL. esophagus dilated Fuller Plan)  . HEMORRHOID BANDING  09-23-13   --Dr. Greer Pickerel  . HERNIA REPAIR     inguinal  . HYSTEROSCOPY W/ ENDOMETRIAL ABLATION    . NASAL SINUS SURGERY     x5  . PARTIAL KNEE ARTHROPLASTY Right 06/01/2020   Procedure: Right knee patellofemoral replacement;  Surgeon: [name], MD;  Location: [place];  Service: Orthopedics;  Laterality: Right;  . REMOVAL OF STONES  09/27/2018   Procedure: REMOVAL OF STONES;  Surgeon: [name], MD;  Location: [facility];  Service: Endoscopy;;  . [name]  09/27/2018   Procedure: SPHINCTEROTOMY;  Surgeon: [name], MD;  Location: [facility];  Service: Endoscopy;;  . TOTAL HIP ARTHROPLASTY Left 03/12/2019   Procedure: LEFT TOTAL HIP ARTHROPLASTY ANTERIOR APPROACH;  [name], [name], MD)  . 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 Influenza Split 05/04/2011, 04/04/2012  . Influenza Whole 05/25/2007, 04/28/2008, 04/30/2009, 03/26/2010  . Influenza,inj,Quad PF,6+ Mos 04/16/2015, 04/15/2016, 05/16/2017, 04/10/2018, 04/19/2019, 04/07/2020  . Influenza-Unspecified  03/25/2014  . MMR 11/28/2017  . Moderna Sars-Covid-2 Vaccination 09/02/2019, 09/30/2019, 04/07/2020  . Pneumococcal Conjugate-13 08/02/2013  . Pneumococcal Polysaccharide-23 07/30/2009  . Td 11/01/2005, 11/10/2016  . Zoster Recombinat (Shingrix) 07/21/2017, 11/03/2017     Objective: Vital Signs: BP (!) 144/90 (BP Location: Left Arm, Patient Position: Sitting, Cuff Size: Normal)   Pulse 87   Resp 14   Ht 5' 4"  (1.626 m)   Wt 128 lb (58.1 kg)   LMP 07/25/1998   BMI 21.97 kg/m    Physical Exam Vitals and nursing note reviewed.  Constitutional:      Appearance: She is well-developed and well-nourished.  HENT:     Head: Normocephalic and atraumatic.  Eyes:     Extraocular Movements: EOM normal.     Conjunctiva/sclera: Conjunctivae normal.  Cardiovascular:     Pulses: Intact distal pulses.  Pulmonary:     Effort: Pulmonary effort is normal.  Abdominal:  Palpations: Abdomen is soft.  Musculoskeletal:     Cervical back: Normal range of motion.  Lymphadenopathy:     Cervical: No cervical adenopathy.  Skin:    General: Skin is warm and dry.     Capillary Refill: Capillary refill takes less than 2 seconds.     Comments: Telangectasia's on face and chest noted.  No digital ulcerations or signs of gangrene.  Calcinosis cutis on right 4th finger.   Skin thickening and tightness noted distal to MCPs-no progression noted.  Neurological:     Mental Status: She is alert and oriented to person, place, and time.  Psychiatric:        Mood and Affect: Mood and affect normal.        Behavior: Behavior normal.       Musculoskeletal Exam: C-spine limited ROM with lateral rotation.  Thoracolumbar scoliosis noted.  Trapezius muscle tension and tenderness bilaterally.  Shoulder joints, elbow joints, wrist joints, MCPs, PIPs, and DIPs good ROM with no synovitis.  PIP and DIP thickening consistent with osteoarthritis of both hands.  Left hip replacement has good ROM with no discomfort.   Right hip joint has good ROM with no discomfort. Right knee patellofemoral replacement has full extension with warmth but no effusion.  Left knee has good ROM with crepitus.  Ankle joints good ROM with no tenderness or inflammation.   CDAI Exam: CDAI Score: -- Patient Global: --; Provider Global: -- Swollen: --; Tender: -- Joint Exam 09/17/2020   No joint exam has been documented for this visit   There is currently no information documented on the homunculus. Go to the Rheumatology activity and complete the homunculus joint exam.  Investigation: No additional findings.  Imaging: No results found.  Recent Labs: Lab Results  Component Value Date   WBC 9.9 05/27/2020   HGB 12.8 05/27/2020   PLT 348.0 05/27/2020   NA 133 (L) 05/27/2020   K 4.3 05/27/2020   CL 97 05/27/2020   CO2 28 05/27/2020   GLUCOSE 97 05/27/2020   BUN 10 05/27/2020   CREATININE 0.58 05/27/2020   BILITOT 0.4 02/03/2020   ALKPHOS 79 02/03/2020   AST 20 02/03/2020   ALT 14 02/03/2020   PROT 6.6 02/03/2020   ALBUMIN 4.4 02/03/2020   CALCIUM 9.5 05/27/2020   GFRAA >60 03/13/2019    Speciality Comments: No specialty comments available.  Procedures:  No procedures performed Allergies: Inh [isoniazid], Cymbalta [duloxetine hcl], Nucynta [tapentadol], Silenor [doxepin hcl], Benzoin, Fentanyl, Celebrex [celecoxib], Codeine phosphate, Lithium, Meperidine hcl, and Sulfamethoxazole   Assessment / Plan:     Visit Diagnoses: 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[place]) - AVISE on 03/26/20: indeterminate index 0.0, ANA positive, no titer, weak positive anti-histone, rest of panel negative, family history of scleroderma, long standing hx of raynaud's, +telangectasias: She has not developed any new or worsening symptoms since her last office visit on 04/21/20.  She continues to have frequent symptoms of raynaud's 1-2 times per day despite taking amlodipine 7.5 mg daily.  She was advised to take aspirin 81 mg 1 tablet by mouth every  other day. No progression in skin thickening or tightness distal to MCPs noted.  No digital ulcerations or signs of gangrene.  Non tender calcinosis cutis noted on right finger. Telangectasia's noted on face and chest.   Since the patient's last office visit on 04/21/20 she was evaluated by Dr. Haroldine Laws and Dr. Vaughan Browner to rule out pulmonary hypertension and ILD due to new diagnosis of limited systemic sclerosis.  She  underwent an echocardiogram on 06/22/2020 which did not reveal any findings consistent with pulmonary hypertension.  Dr. Haroldine Laws plans to repeat echo in 1 year.  Discussed the importance of close blood pressure monitoring.  She was advised to avoid systemic steroid use due to the risk for scleroderma renal crisis.  She had a high-resolution chest CT performed on 05/08/2020 which did not reveal any evidence of fibrotic interstitial lung disease.  PFTs were performed on 07/07/2020 which were normal.  She had an office visit on 09/07/2020 with Dr. Vaughan Browner who agreed that there was no evidence of ILD on CT and PFTs were WNL.  She has not been experiencing any shortness of breath, palpitations, or chest pain.  She has continues to have chronic constipation and episodic dysphagia (esophageal dilatation in 2018). She has a routine follow up visit with Dr. Fuller Plan (GI) on 10/20/20.  She was advised to notify Dr. Fuller Plan of the new diagnosis for further recommendations. She may need a GI motility study in the future.  We will check CBC, CMP, and UA today. She was advised to notify us if she develops any new or worsening symptoms.  She will follow up in 5 months. - Plan: CBC with Differential/Platelet, COMPLETE METABOLIC PANEL WITH GFR, Urinalysis, Routine w reflex microscopic  [name] disease without gangrene: She continues to have frequent symptoms of [name] at least 1-2 times per day.  Calcinosis cutis noted on the right fourth digit.  No digital ulcerations or signs of gangrene were noted.  Good capillary  refill less than 2 seconds noted. She has been taking amlodipine 5 mg 1.5 tablets by mouth daily.  She has had difficulty breaking the 5 mg tablets in half so a new prescription for amlodipine 2.5 mg daily will be sent to the pharmacy to take in conjunction with the 5 mg tablets for a total of 7.5 mg daily. Advised to notify us if she develops any new or worsening symptoms.  Primary osteoarthritis of left knee-  She has intermittent discomfort and stiffness in the left knee joint.  Left knee crepitus noted. She has been performing lower extremity muscle strengthening exercises and walking on a daily basis for exercise.   History of right knee surgery: Patellofemoral replacement performed by Dr. Marlou Sa on 06/01/20.  She completed physical therapy and has resumed walking on a daily basis for exercise.  According to the patient she has a gastrocnemius muscle strain which has been causing discomfort in the posterior aspect of the knee.  She plans on following up with Dr. Marlou Sa if her discomfort persists or worsens. She can use voltaren gel as needed for pain relief.   Primary osteoarthritis of both hands: She has PIP and DIP thickening consistent with osteoarthritis of both hands.  We discussed the importance of joint protection and muscle strengthening.  She was given a handout of hand exercises to perform.  Several of these exercises were demonstrated today in the office.  The use of voltaren gel was discussed.   S/P hip replacement, left: Doing well.  She has good range of motion with no discomfort.  Some tenderness location over the left trochanteric bursa.  Trochanteric bursitis of both hips: She has tenderness location of her bilateral trochanteric bursa.  She was encouraged to perform stretching exercises on a daily basis.  DDD (degenerative disc disease), cervical: Chronic pain.  She has limited range of motion with lateral rotation.  She has an upcoming appointment with Dr. [name].  DDD (degenerative  disc disease),  lumbar: Chronic pain.  She has an upcoming appointment with Dr. [name].  Fibromyalgia: She continues to have generalized hyperalgesia and positive tender points on exam.  She has trapezius muscle tension and muscle tenderness bilaterally.  She is also experiencing discomfort due to trochanter bursitis of both hips.  We discussed the importance of performing stretching exercises on a daily basis.  We also discussed the use of topical agents as well as heat for symptomatic relief.  She will continue taking Lyrica 150 mg 1 capsule by mouth twice daily.  Chronic pain syndrome: She takes Lyrica 150 mg 1 capsule twice daily.  CFS (chronic fatigue syndrome): Stable.  Discussed the importance of regular exercise.  She has been working out at the fitness center at [employer] and has started to walk on a daily basis for exercise.  Other medical conditions are listed as follows:  Hx of porphyria  History of bipolar disorder  Prediabetes  History of gastroesophageal reflux (GERD)  Family history of scleroderma  Orders: Orders Placed This Encounter  Procedures  . CBC with Differential/Platelet  . COMPLETE METABOLIC PANEL WITH GFR  . Urinalysis, Routine w reflex microscopic   Meds ordered this encounter  Medications  . amLODipine (NORVASC) 2.5 MG tablet    Sig: Take 1 tablet by mouth daily with amlodipine 5 mg tablet daily (7.5 mg total daily).    Dispense:  90 tablet    Refill:  0     Follow-Up Instructions: Return in about 5 months (around 02/14/2021) for Limited systemic sclerosis, Fibromyalgia.   Ofilia Neas, PA-C  Note - This record has been created using Dragon software.  Chart creation errors have been sought, but may not always  have been located. Such creation errors do not reflect on  the standard of medical care.

## 2020-09-07 ENCOUNTER — Ambulatory Visit (INDEPENDENT_AMBULATORY_CARE_PROVIDER_SITE_OTHER): Payer: Medicare Other | Admitting: Pulmonary Disease

## 2020-09-07 ENCOUNTER — Encounter: Payer: Self-pay | Admitting: Pulmonary Disease

## 2020-09-07 ENCOUNTER — Other Ambulatory Visit: Payer: Self-pay

## 2020-09-07 VITALS — BP 128/82 | HR 88 | Temp 97.3°F | Ht 64.5 in | Wt 127.8 lb

## 2020-09-07 DIAGNOSIS — R911 Solitary pulmonary nodule: Secondary | ICD-10-CM | POA: Diagnosis not present

## 2020-09-07 DIAGNOSIS — M349 Systemic sclerosis, unspecified: Secondary | ICD-10-CM

## 2020-09-07 NOTE — Patient Instructions (Signed)
I have reviewed his CT scan and lung function test which did not show significant lung issues from scleroderma There is a small lung nodule which needs follow-up CT which has already been ordered for October  I will see you in mid October after the CT scan.

## 2020-09-07 NOTE — Progress Notes (Signed)
Rachel Duran    [id number]    27-Aug-1955  Primary Care Physician:Gutierrez, Garlon Hatchet, MD  Referring Physician: Ria Bush, MD [address] [place],  [place] [address]  Chief complaint: Consult for ILD evaluation  HPI: 65 year old with history of [name], fibromyalgia with recent diagnosis of scleroderma Referred for evaluation of interstitial lung disease.  She has a strong family history of scleroderma and longstanding [name] phenomena.  More recently she has developed skin thickening over the digits and findings of  capillary dilation and dropout on dermatology evaluation supporting a diagnosis of systemic sclerosis.  She has history of dysphagia and follows with Dr. Fuller Plan, gastroenterology.  Underwent esophageal dilatation around 2018.  Currently she denies any dyspnea, leg swelling.  She has also been referred to Dr. 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  No mold, hot tub, Jacuzzi Smoking history: Never smoker Travel history: Originally from [place].  No significant recent travel Relevant family history: Mother had scleroderma with interstitial lung disease and pulmonary hypertension.  Interim history: She is here for review of CT and PFTs States that breathing is doing well with no issues Recently had a total knee replacement and is starting to be active again  She was evaluated by Dr. Haroldine Laws with no evidence of pulmonary hypertension on echocardiogram.  Outpatient Encounter Medications as of 09/07/2020  Medication Sig  . ALPRAZolam (XANAX) 1 MG tablet Take 0.5 mg by mouth at bedtime.  Marland Kitchen amLODipine (NORVASC) 5 MG tablet TAKE 1 AND 1/2 TABLETS BY MOUTH DAILY  . amoxicillin (AMOXIL) 500 MG tablet Take 4 tablets (2,000mg ) by mouth 30-60 minutes prior to dental cleaning.  Marland Kitchen amphetamine-dextroamphetamine  (ADDERALL XR) 30 MG 24 hr capsule TAKE 1 CAPSULE BY MOUTH EVERY MORNING  . ARIPiprazole (ABILIFY) 5 MG tablet Take 5 mg by mouth daily.  Marland Kitchen aspirin 81 MG chewable tablet Chew 1 tablet (81 mg total) by mouth daily.  . calcium-vitamin D (OSCAL WITH D) 500-200 MG-UNIT tablet Take 1 tablet by mouth 2 (two) times daily.  . cyanocobalamin (,VITAMIN B-12,) 1000 MCG/ML injection INJECT 1ML INTO THE MUSCLE EVERY 21 DAYSAS DIRECTED  . diclofenac Sodium (VOLTAREN) 1 % GEL APPLY TWO TO FOUR GRAMS TO AFFECTED JOINT UP TO FOUR TIMES A DAY  . Eszopiclone 3 MG TABS TAKE ONE TABLET AT BEDTIME  . ferrous sulfate 325 (65 FE) MG tablet Take 325 mg by mouth every Monday, Wednesday, and Friday.   . fluticasone (FLONASE) 50 MCG/ACT nasal spray Place 1 spray into both nostrils daily as needed for allergies.  Marland Kitchen lamoTRIgine (LAMICTAL) 100 MG tablet Take 100 mg by mouth daily.  Marland Kitchen LINZESS 290 MCG CAPS capsule TAKE 1 CAPSULE BY MOUTH DAILY  . morphine (MSIR) 15 MG tablet TAKE 1 TABLET BY MOUTH THREE TIMES DAILYAS NEEDED FOR MODERATE OR SEVERE PAIN  . naloxegol oxalate (MOVANTIK) 25 MG TABS tablet Take 1 tablet (25 mg total) by mouth daily.  . NONFORMULARY OR COMPOUNDED ITEM Testosterone propionate 2% in white petrolatum, apply small amount once daily for 5 days per week.  . ondansetron (ZOFRAN) 4 MG tablet TAKE ONE TABLET EVERY EIGHT HOURS AS NEEDED FOR NAUSEA / VOMITING  . ondansetron (ZOFRAN-ODT) 4 MG disintegrating tablet TAKE 1 TABLET BY MOUTH EVERY 8 HOURS AS NEEDED FOR NAUSEA OR VOMITING  . pantoprazole (PROTONIX) 40 MG tablet TAKE 1  TABLET BY MOUTH TWICE DAILY WITH A MEAL  . pentosan polysulfate (ELMIRON) 100 MG capsule Take 200 mg by mouth 2 (two) times daily.   . phenazopyridine (PYRIDIUM) 100 MG tablet Take by mouth as needed.   . polyethylene glycol (MIRALAX / GLYCOLAX) 17 g packet Take 17 g by mouth 3 (three) times daily.  . Prasterone (INTRAROSA) 6.5 MG INST Place 1 suppository vaginally at bedtime.  .  pregabalin (LYRICA) 150 MG capsule TAKE 1 CAPSULE BY MOUTH TWICE DAILY  . PREVIDENT 5000 BOOSTER PLUS 1.1 % PSTE See admin instructions.  . senna (SENOKOT) 8.6 MG tablet Take 2 tablets by mouth daily as needed.   . SF 5000 PLUS 1.1 % CREA dental cream Place 1 application onto teeth at bedtime.   Marland Kitchen tiZANidine (ZANAFLEX) 2 MG tablet Take 1 tablet (2 mg total) by mouth every 8 (eight) hours as needed.  . ursodiol (ACTIGALL) 300 MG capsule Take 1 capsule (300 mg total) by mouth 2 (two) times daily.   No facility-administered encounter medications on file as of 09/07/2020.   Physical Exam: Blood pressure 128/82, pulse 88, temperature (!) 97.3 F (36.3 C), temperature source Temporal, height 5' 4.5" (1.638 m), weight 127 lb 12.8 oz (58 kg), last menstrual period 07/25/1998, SpO2 99 %. Gen:      No acute distress HEENT:  EOMI, sclera anicteric Neck:     No masses; no thyromegaly Lungs:    Clear to auscultation bilaterally; normal respiratory effort CV:         Regular rate and rhythm; no murmurs Abd:      + bowel sounds; soft, non-tender; no palpable masses, no distension Ext:    No edema; adequate peripheral perfusion Skin:      Warm and dry; no rash Neuro: alert and oriented x 3 Psych: normal mood and affect  Data Reviewed: Imaging: Chest x-ray 10/18/2016-no acute cardiopulmonary abnormality.   High-resolution CT 05/08/2020-no evidence of fibrotic interstitial lung disease, mild tubular bronchiectasis, air trapping, 6 mm nodule in the right pulmonary apex.  Aortic atherosclerosis. I have reviewed the images personally.  PFTs: 07/07/2020 FVC 3.18 [96%], FEV1 2.63 [103%], F/F 83, TLC 5.20 [101%], DLCO 17.43 [85%] Normal test  Cardiac: Echocardiogram 06/22/2020 LVEF 47-65%, grade 1 diastolic dysfunction, normal RV systolic size and function.  Assessment:  Consult for interstitial lung disease Limited systemic sclerosis No evidence of interstitial lung disease on CT scan, PFTs are  normal She has mild air trapping and bronchiectasis which can be observed without treatment Echo with no pulmonary hypertension Discussed results with patient.  Will continue annual monitoring  Lung nodule Likely benign in a non-smoker Follow-up CT ordered for October 2022  Plan/Recommendations: Follow-up CT for lung nodule Return to clinic in 6 months.  Marshell Garfinkel MD [facility] 09/07/2020, 11:24 AM  CC: Ria Bush, MD

## 2020-09-08 ENCOUNTER — Ambulatory Visit: Payer: Medicare Other | Admitting: Pulmonary Disease

## 2020-09-17 ENCOUNTER — Other Ambulatory Visit: Payer: Self-pay

## 2020-09-17 ENCOUNTER — Encounter: Payer: Self-pay | Admitting: Physician Assistant

## 2020-09-17 ENCOUNTER — Ambulatory Visit (INDEPENDENT_AMBULATORY_CARE_PROVIDER_SITE_OTHER): Payer: Medicare Other | Admitting: Physician Assistant

## 2020-09-17 VITALS — BP 144/90 | HR 87 | Resp 14 | Ht 64.0 in | Wt 128.0 lb

## 2020-09-17 DIAGNOSIS — Z8639 Personal history of other endocrine, nutritional and metabolic disease: Secondary | ICD-10-CM

## 2020-09-17 DIAGNOSIS — R7303 Prediabetes: Secondary | ICD-10-CM

## 2020-09-17 DIAGNOSIS — Z8659 Personal history of other mental and behavioral disorders: Secondary | ICD-10-CM

## 2020-09-17 DIAGNOSIS — M5136 Other intervertebral disc degeneration, lumbar region: Secondary | ICD-10-CM

## 2020-09-17 DIAGNOSIS — G9332 Myalgic encephalomyelitis/chronic fatigue syndrome: Secondary | ICD-10-CM

## 2020-09-17 DIAGNOSIS — M349 Systemic sclerosis, unspecified: Secondary | ICD-10-CM

## 2020-09-17 DIAGNOSIS — I7300 Raynaud's syndrome without gangrene: Secondary | ICD-10-CM

## 2020-09-17 DIAGNOSIS — M7061 Trochanteric bursitis, right hip: Secondary | ICD-10-CM

## 2020-09-17 DIAGNOSIS — M7062 Trochanteric bursitis, left hip: Secondary | ICD-10-CM

## 2020-09-17 DIAGNOSIS — M19041 Primary osteoarthritis, right hand: Secondary | ICD-10-CM

## 2020-09-17 DIAGNOSIS — M19042 Primary osteoarthritis, left hand: Secondary | ICD-10-CM

## 2020-09-17 DIAGNOSIS — R5382 Chronic fatigue, unspecified: Secondary | ICD-10-CM

## 2020-09-17 DIAGNOSIS — M5030 Other cervical disc degeneration, unspecified cervical region: Secondary | ICD-10-CM

## 2020-09-17 DIAGNOSIS — M797 Fibromyalgia: Secondary | ICD-10-CM

## 2020-09-17 DIAGNOSIS — M51369 Other intervertebral disc degeneration, lumbar region without mention of lumbar back pain or lower extremity pain: Secondary | ICD-10-CM

## 2020-09-17 DIAGNOSIS — M170 Bilateral primary osteoarthritis of knee: Secondary | ICD-10-CM

## 2020-09-17 DIAGNOSIS — M1712 Unilateral primary osteoarthritis, left knee: Secondary | ICD-10-CM

## 2020-09-17 DIAGNOSIS — Z8719 Personal history of other diseases of the digestive system: Secondary | ICD-10-CM

## 2020-09-17 DIAGNOSIS — Z96642 Presence of left artificial hip joint: Secondary | ICD-10-CM

## 2020-09-17 DIAGNOSIS — Z9889 Other specified postprocedural states: Secondary | ICD-10-CM

## 2020-09-17 DIAGNOSIS — Z8269 Family history of other diseases of the musculoskeletal system and connective tissue: Secondary | ICD-10-CM

## 2020-09-17 DIAGNOSIS — G894 Chronic pain syndrome: Secondary | ICD-10-CM

## 2020-09-17 MED ORDER — AMLODIPINE BESYLATE 2.5 MG PO TABS: ORAL_TABLET | ORAL | 0 refills | Status: DC

## 2020-09-17 NOTE — Patient Instructions (Signed)
Hand Exercises Hand exercises can be helpful for almost anyone. These exercises can strengthen the hands, improve flexibility and movement, and increase blood flow to the hands. These results can make work and daily tasks easier. Hand exercises can be especially helpful for people who have joint pain from arthritis or have nerve damage from overuse (carpal tunnel syndrome). These exercises can also help people who have injured a hand. Exercises Most of these hand exercises are gentle stretching and motion exercises. It is usually safe to do them often throughout the day. Warming up your hands before exercise may help to reduce stiffness. You can do this with gentle massage or by placing your hands in warm water for 10-15 minutes. It is normal to feel some stretching, pulling, tightness, or mild discomfort as you begin new exercises. This will gradually improve. Stop an exercise right away if you feel sudden, severe pain or your pain gets worse. Ask your health care provider which exercises are best for you. Knuckle bend or "claw" fist 1. Stand or sit with your arm, hand, and all five fingers pointed straight up. Make sure to keep your wrist straight during the exercise. 2. Gently bend your fingers down toward your palm until the tips of your fingers are touching the top of your palm. Keep your big knuckle straight and just bend the small knuckles in your fingers. 3. Hold this position for __________ seconds. 4. Straighten (extend) your fingers back to the starting position. Repeat this exercise 5-10 times with each hand. Full finger fist 1. Stand or sit with your arm, hand, and all five fingers pointed straight up. Make sure to keep your wrist straight during the exercise. 2. Gently bend your fingers into your palm until the tips of your fingers are touching the middle of your palm. 3. Hold this position for __________ seconds. 4. Extend your fingers back to the starting position, stretching every  joint fully. Repeat this exercise 5-10 times with each hand. Straight fist 1. Stand or sit with your arm, hand, and all five fingers pointed straight up. Make sure to keep your wrist straight during the exercise. 2. Gently bend your fingers at the big knuckle, where your fingers meet your hand, and the middle knuckle. Keep the knuckle at the tips of your fingers straight and try to touch the bottom of your palm. 3. Hold this position for __________ seconds. 4. Extend your fingers back to the starting position, stretching every joint fully. Repeat this exercise 5-10 times with each hand. Tabletop 1. Stand or sit with your arm, hand, and all five fingers pointed straight up. Make sure to keep your wrist straight during the exercise. 2. Gently bend your fingers at the big knuckle, where your fingers meet your hand, as far down as you can while keeping the small knuckles in your fingers straight. Think of forming a tabletop with your fingers. 3. Hold this position for __________ seconds. 4. Extend your fingers back to the starting position, stretching every joint fully. Repeat this exercise 5-10 times with each hand. Finger spread 1. Place your hand flat on a table with your palm facing down. Make sure your wrist stays straight as you do this exercise. 2. Spread your fingers and thumb apart from each other as far as you can until you feel a gentle stretch. Hold this position for __________ seconds. 3. Bring your fingers and thumb tight together again. Hold this position for __________ seconds. Repeat this exercise 5-10 times with each hand.   Making circles 1. Stand or sit with your arm, hand, and all five fingers pointed straight up. Make sure to keep your wrist straight during the exercise. 2. Make a circle by touching the tip of your thumb to the tip of your index finger. 3. Hold for __________ seconds. Then open your hand wide. 4. Repeat this motion with your thumb and each finger on your  hand. Repeat this exercise 5-10 times with each hand. Thumb motion 1. Sit with your forearm resting on a table and your wrist straight. Your thumb should be facing up toward the ceiling. Keep your fingers relaxed as you move your thumb. 2. Lift your thumb up as high as you can toward the ceiling. Hold for __________ seconds. 3. Bend your thumb across your palm as far as you can, reaching the tip of your thumb for the small finger (pinkie) side of your palm. Hold for __________ seconds. Repeat this exercise 5-10 times with each hand. Grip strengthening 1. Hold a stress ball or other soft ball in the middle of your hand. 2. Slowly increase the pressure, squeezing the ball as much as you can without causing pain. Think of bringing the tips of your fingers into the middle of your palm. All of your finger joints should bend when doing this exercise. 3. Hold your squeeze for __________ seconds, then relax. Repeat this exercise 5-10 times with each hand.   Contact a health care provider if:  Your hand pain or discomfort gets much worse when you do an exercise.  Your hand pain or discomfort does not improve within 2 hours after you exercise. If you have any of these problems, stop doing these exercises right away. Do not do them again unless your health care provider says that you can. Get help right away if:  You develop sudden, severe hand pain or swelling. If this happens, stop doing these exercises right away. Do not do them again unless your health care provider says that you can. This information is not intended to replace advice given to you by your health care provider. Make sure you discuss any questions you have with your health care provider. Document Revised: 11/01/2018 Document Reviewed: 07/12/2018 Elsevier Patient Education  2021 Elsevier Inc.  

## 2020-09-18 ENCOUNTER — Other Ambulatory Visit: Payer: Self-pay | Admitting: Family Medicine

## 2020-09-18 NOTE — Telephone Encounter (Signed)
Name of Medication: Morphine Name of Pharmacy: [facility] or Written Date and Quantity: 08/13/20, #90 Last Office Visit and Type: 07/10/20, AWV Next Office Visit and Type: 10/13/20, 3 mo chronic pain f/u Last Controlled Substance Agreement Date: 04/19/19 Last UDS: 04/19/19

## 2020-09-21 NOTE — Telephone Encounter (Signed)
ERx 

## 2020-10-12 ENCOUNTER — Other Ambulatory Visit: Payer: Self-pay

## 2020-10-13 ENCOUNTER — Encounter: Payer: Self-pay | Admitting: Family Medicine

## 2020-10-13 ENCOUNTER — Ambulatory Visit (INDEPENDENT_AMBULATORY_CARE_PROVIDER_SITE_OTHER): Payer: Medicare Other | Admitting: Family Medicine

## 2020-10-13 VITALS — BP 128/90 | HR 101 | Temp 97.7°F | Ht 64.0 in | Wt 129.0 lb

## 2020-10-13 DIAGNOSIS — N3010 Interstitial cystitis (chronic) without hematuria: Secondary | ICD-10-CM

## 2020-10-13 DIAGNOSIS — M349 Systemic sclerosis, unspecified: Secondary | ICD-10-CM | POA: Diagnosis not present

## 2020-10-13 DIAGNOSIS — F5104 Psychophysiologic insomnia: Secondary | ICD-10-CM | POA: Diagnosis not present

## 2020-10-13 DIAGNOSIS — G8929 Other chronic pain: Secondary | ICD-10-CM | POA: Diagnosis not present

## 2020-10-13 DIAGNOSIS — I7300 Raynaud's syndrome without gangrene: Secondary | ICD-10-CM

## 2020-10-13 DIAGNOSIS — G894 Chronic pain syndrome: Secondary | ICD-10-CM

## 2020-10-13 DIAGNOSIS — K5903 Drug induced constipation: Secondary | ICD-10-CM

## 2020-10-13 DIAGNOSIS — M797 Fibromyalgia: Secondary | ICD-10-CM

## 2020-10-13 LAB — POC URINALSYSI DIPSTICK (AUTOMATED)
Bilirubin, UA: NEGATIVE
Blood, UA: NEGATIVE
Glucose, UA: NEGATIVE
Ketones, UA: NEGATIVE
Leukocytes, UA: NEGATIVE
Nitrite, UA: NEGATIVE
Protein, UA: NEGATIVE
Spec Grav, UA: 1.015 (ref 1.010–1.025)
Urobilinogen, UA: 0.2 E.U./dL
pH, UA: 6 (ref 5.0–8.0)

## 2020-10-13 LAB — CBC WITH DIFFERENTIAL/PLATELET
Basophils Absolute: 0.1 10*3/uL (ref 0.0–0.1)
Basophils Relative: 0.8 % (ref 0.0–3.0)
Eosinophils Absolute: 0.1 10*3/uL (ref 0.0–0.7)
Eosinophils Relative: 1.1 % (ref 0.0–5.0)
HCT: 39.6 % (ref 36.0–46.0)
Hemoglobin: 13 g/dL (ref 12.0–15.0)
Lymphocytes Relative: 23.7 % (ref 12.0–46.0)
Lymphs Abs: 1.8 10*3/uL (ref 0.7–4.0)
MCHC: 32.8 g/dL (ref 30.0–36.0)
MCV: 84.8 fl (ref 78.0–100.0)
Monocytes Absolute: 0.5 10*3/uL (ref 0.1–1.0)
Monocytes Relative: 6.1 % (ref 3.0–12.0)
Neutro Abs: 5.1 10*3/uL (ref 1.4–7.7)
Neutrophils Relative %: 68.3 % (ref 43.0–77.0)
Platelets: 335 10*3/uL (ref 150.0–400.0)
RBC: 4.67 Mil/uL (ref 3.87–5.11)
RDW: 14.6 % (ref 11.5–15.5)
WBC: 7.5 10*3/uL (ref 4.0–10.5)

## 2020-10-13 LAB — COMPREHENSIVE METABOLIC PANEL
ALT: 15 U/L (ref 0–35)
AST: 21 U/L (ref 0–37)
Albumin: 4.7 g/dL (ref 3.5–5.2)
Alkaline Phosphatase: 93 U/L (ref 39–117)
BUN: 8 mg/dL (ref 6–23)
CO2: 31 mEq/L (ref 19–32)
Calcium: 9.6 mg/dL (ref 8.4–10.5)
Chloride: 99 mEq/L (ref 96–112)
Creatinine, Ser: 0.59 mg/dL (ref 0.40–1.20)
GFR: 96.39 mL/min (ref >60.00)
Glucose, Bld: 108 mg/dL — ABNORMAL HIGH (ref 70–99)
Potassium: 4.4 mEq/L (ref 3.5–5.1)
Sodium: 135 mEq/L (ref 135–145)
Total Bilirubin: 0.3 mg/dL (ref 0.2–1.2)
Total Protein: 7.1 g/dL (ref 6.0–8.3)

## 2020-10-13 MED ORDER — MORPHINE SULFATE 15 MG PO TABS: ORAL_TABLET | ORAL | 0 refills | Status: DC

## 2020-10-13 MED ORDER — ESZOPICLONE 3 MG PO TABS: 3.0000 mg | ORAL_TABLET | Freq: Every day | ORAL | 0 refills | Status: DC

## 2020-10-13 NOTE — Assessment & Plan Note (Addendum)
Established with cards, pulm, sees rheum.  Fortunately no evidence of pulm or cardiac involvement at this time, planned continued monitoring.  Will fax results attn Hazel Sams PA at Fax: [phone]

## 2020-10-13 NOTE — Progress Notes (Signed)
Patient ID: Cing [place], female    DOB: [birth year], 65 y.o.   MRN: [id number]  This visit was conducted in person.  BP 128/90 (BP Location: Left Arm, Patient Position: Sitting, Cuff Size: Small)   Pulse (!) 101   Temp 97.7 F (36.5 C) (Temporal)   Ht 5\' 4"  (1.626 m)   Wt 129 lb (58.5 kg)   LMP 07/25/1998   SpO2 99%   BMI 22.14 kg/m    CC: 3 mo chronic pain f/u visit  Subjective:   HPI: Rachel Duran is a 65 y.o. female presenting on 10/13/2020 for Follow-up (Chronic pain)   Longstanding chronic pain managed with opiate - morphine MSIR 15mg  TID PRN #90/mo.   Limited systemic sclerosis - established with rheum Hazel Sams PA), saw pulm ([place]) and cards (Bensimhon). No evidence of interstitial lung disease, normal PFTs, planned annual monitoring. rec avoiding systemic steroids. Requests labs for rheum done today.   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 Ongoing R posterior knee pain - planning to return to see ortho. She is able to be more active - increasing walking 3-4 times a week - about 1 mile at a time.   To see GI next week - ongoing constipation trouble. Swallowing about the same, trouble with certain sold foods like meats.      Relevant past medical, surgical, family and social history reviewed and updated as indicated. Interim medical history since our last visit reviewed. Allergies and medications reviewed and updated. Outpatient Medications Prior to Visit  Medication Sig Dispense Refill  . ALPRAZolam (XANAX) 1 MG tablet Take 0.5 mg by mouth at bedtime.  0  . amLODipine (NORVASC) 2.5 MG tablet Take 1 tablet by mouth daily with amlodipine 5 mg tablet daily (7.5 mg total daily). 90 tablet 0  . amLODipine (NORVASC) 5 MG tablet TAKE 1 AND 1/2 TABLETS BY MOUTH DAILY 45 tablet 2  . amoxicillin (AMOXIL) 500 MG tablet Take 4 tablets (2,000mg ) by mouth 30-60 minutes prior to dental cleaning. 4 tablet 0  .  amphetamine-dextroamphetamine (ADDERALL XR) 30 MG 24 hr capsule TAKE 1 CAPSULE BY MOUTH EVERY MORNING 90 capsule 0  . ARIPiprazole (ABILIFY) 5 MG tablet Take 5 mg by mouth daily.    Marland Kitchen aspirin 81 MG chewable tablet Chew 1 tablet (81 mg total) by mouth daily. 30 tablet 0  . calcium-vitamin D (OSCAL WITH D) 500-200 MG-UNIT tablet Take 1 tablet by mouth 2 (two) times daily.    . cyanocobalamin (,VITAMIN B-12,) 1000 MCG/ML injection INJECT 1ML INTO THE MUSCLE EVERY 21 DAYSAS DIRECTED 3 mL 3  . diclofenac Sodium (VOLTAREN) 1 % GEL APPLY TWO TO FOUR GRAMS TO AFFECTED JOINT UP TO FOUR TIMES A DAY 400 g 0  . ferrous sulfate 325 (65 FE) MG tablet Take 325 mg by mouth every Monday, Wednesday, and Friday.     . fluticasone (FLONASE) 50 MCG/ACT nasal spray Place 1 spray into both nostrils daily as needed for allergies. 16 g 5  . lamoTRIgine (LAMICTAL) 100 MG tablet Take 100 mg by mouth daily.    Marland Kitchen LINZESS 290 MCG CAPS capsule TAKE 1 CAPSULE BY MOUTH DAILY 90 capsule 1  . naloxegol oxalate (MOVANTIK) 25 MG TABS tablet Take 1 tablet (25 mg total) by mouth daily. 90 tablet 1  . NONFORMULARY OR COMPOUNDED ITEM Testosterone propionate 2% in white petrolatum, apply small amount once daily for 5 days per week. 60 each 0  . ondansetron (  ZOFRAN) 4 MG tablet TAKE ONE TABLET EVERY EIGHT HOURS AS NEEDED FOR NAUSEA / VOMITING 30 tablet 1  . ondansetron (ZOFRAN-ODT) 4 MG disintegrating tablet TAKE 1 TABLET BY MOUTH EVERY 8 HOURS AS NEEDED FOR NAUSEA OR VOMITING 20 tablet 1  . pantoprazole (PROTONIX) 40 MG tablet TAKE 1 TABLET BY MOUTH TWICE DAILY WITH A MEAL 180 tablet 1  . pentosan polysulfate (ELMIRON) 100 MG capsule Take 200 mg by mouth 2 (two) times daily.     . phenazopyridine (PYRIDIUM) 100 MG tablet Take by mouth as needed.     . polyethylene glycol (MIRALAX / GLYCOLAX) 17 g packet Take 17 g by mouth 3 (three) times daily.    . Prasterone (INTRAROSA) 6.5 MG INST Place 1 suppository vaginally at bedtime. 84 each 3  .  pregabalin (LYRICA) 150 MG capsule TAKE 1 CAPSULE BY MOUTH TWICE DAILY 180 capsule 0  . PREVIDENT 5000 BOOSTER PLUS 1.1 % PSTE See admin instructions.    . senna (SENOKOT) 8.6 MG tablet Take 2 tablets by mouth daily as needed.     . SF 5000 PLUS 1.1 % CREA dental cream Place 1 application onto teeth at bedtime.     Marland Kitchen tiZANidine (ZANAFLEX) 2 MG tablet Take 1 tablet (2 mg total) by mouth every 8 (eight) hours as needed. 30 tablet 0  . ursodiol (ACTIGALL) 300 MG capsule Take 1 capsule (300 mg total) by mouth 2 (two) times daily. 180 capsule 3  . Eszopiclone 3 MG TABS TAKE ONE TABLET AT BEDTIME 90 tablet 0  . morphine (MSIR) 15 MG tablet TAKE 1 TABLET BY MOUTH THREE TIMES DAILYAS NEEDED FOR MODERATE OR SEVERE PAIN 90 tablet 0   No facility-administered medications prior to visit.     Per HPI unless specifically indicated in ROS section below Review of Systems Objective:  BP 128/90 (BP Location: Left Arm, Patient Position: Sitting, Cuff Size: Small)   Pulse (!) 101   Temp 97.7 F (36.5 C) (Temporal)   Ht 5\' 4"  (1.626 m)   Wt 129 lb (58.5 kg)   LMP 07/25/1998   SpO2 99%   BMI 22.14 kg/m   Wt Readings from Last 3 Encounters:  10/13/20 129 lb (58.5 kg)  09/17/20 128 lb (58.1 kg)  09/07/20 127 lb 12.8 oz (58 kg)      Physical Exam Vitals and nursing note reviewed.  Constitutional:      Appearance: Normal appearance. She is not ill-appearing.  Eyes:     Extraocular Movements: Extraocular movements intact.     Pupils: Pupils are equal, round, and reactive to light.  Cardiovascular:     Rate and Rhythm: Normal rate and regular rhythm.     Pulses: Normal pulses.     Heart sounds: Normal heart sounds. No murmur heard.   Pulmonary:     Effort: Pulmonary effort is normal. No respiratory distress.     Breath sounds: Normal breath sounds. No wheezing, rhonchi or rales.  Skin:    General: Skin is warm and dry.     Findings: No rash.  Neurological:     Mental Status: She is alert.   Psychiatric:        Mood and Affect: Mood normal.        Behavior: Behavior normal.       Results for orders placed or performed in visit on 10/13/20  POCT Urinalysis Dipstick (Automated)  Result Value Ref Range   Color, UA yellow    Clarity, UA clear  Glucose, UA Negative Negative   Bilirubin, UA neg    Ketones, UA neg    Spec Grav, UA 1.015 1.010 - 1.025   Blood, UA neg    pH, UA 6.0 5.0 - 8.0   Protein, UA Negative Negative   Urobilinogen, UA 0.2 0.2 or 1.0 E.U./dL   Nitrite, UA neg    Leukocytes, UA Negative Negative   *Note: Due to a large number of results and/or encounters for the requested time period, some results have not been displayed. A complete set of results can be found in Results Review.   Assessment & Plan:  This visit occurred during the SARS-CoV-2 public health emergency.  Safety protocols were in place, including screening questions prior to the visit, additional usage of staff PPE, and extensive cleaning of exam room while observing appropriate contact time as indicated for disinfecting solutions.   Problem List Items Addressed This Visit    Chronic insomnia    Stable period on Lunesta - refilled.      [name] syndrome    Continue amlodipine.       Drug-induced constipation    Continues daily linzess and movantik along with miralax and benefiber - ongoing constipation despite this - attributes some to limited systemic sclerosis. Finds movantik is losing effectiveness. Sometimes needs to add MOM or sennakot or MgCitrate for effective bowel movement.  Has GI f/u planned next week.       Chronic interstitial cystitis    Update UA per rheum request.       Fibromyalgia   Relevant Medications   morphine (MSIR) 15 MG tablet (Start on 10/18/2020)   Chronic pain syndrome   Relevant Medications   morphine (MSIR) 15 MG tablet (Start on 10/18/2020)   Encounter for chronic pain management    [place] CSRS reviewed.  Stable period on MSIR 15mg  2-3 x/day       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[place]) - Primary    Established with cards, pulm, sees rheum.  Fortunately no evidence of pulm or cardiac involvement at this time, planned continued monitoring.  Will fax results attn Hazel Sams PA at Fax: [phone]      Relevant Orders   Comprehensive metabolic panel   CBC with Differential/Platelet    Other Visit Diagnoses    Interstitial cystitis       Relevant Orders   POCT Urinalysis Dipstick (Automated) (Completed)       Meds ordered this encounter  Medications  . Eszopiclone 3 MG TABS    Sig: Take 1 tablet (3 mg total) by mouth at bedtime. Take immediately before bedtime    Dispense:  90 tablet    Refill:  0  . morphine (MSIR) 15 MG tablet    Sig: TAKE 1 TABLET BY MOUTH THREE TIMES DAILYAS NEEDED FOR MODERATE OR SEVERE PAIN    Dispense:  90 tablet    Refill:  0   Orders Placed This Encounter  Procedures  . Comprehensive metabolic panel  . CBC with Differential/Platelet  . POCT Urinalysis Dipstick (Automated)    Patient Instructions  Labs today including urinalysis.  You are doing well lunesta and morphine refilled today.  Return in 3 months for next visit.   Follow up plan: Return in about 3 months (around 01/13/2021), or if symptoms worsen or fail to improve, for follow up visit.  Ria Bush, MD

## 2020-10-13 NOTE — Assessment & Plan Note (Signed)
[place] CSRS reviewed.  Stable period on MSIR 15mg  2-3 x/day

## 2020-10-13 NOTE — Assessment & Plan Note (Signed)
Update UA per rheum request.

## 2020-10-13 NOTE — Assessment & Plan Note (Addendum)
Continues daily linzess and movantik along with miralax and benefiber - ongoing constipation despite this - attributes some to limited systemic sclerosis. Finds movantik is losing effectiveness. Sometimes needs to add MOM or sennakot or MgCitrate for effective bowel movement.  Has GI f/u planned next week.

## 2020-10-13 NOTE — Patient Instructions (Addendum)
Labs today including urinalysis.  You are doing well lunesta and morphine refilled today.  Return in 3 months for next visit.

## 2020-10-13 NOTE — Assessment & Plan Note (Signed)
-   Continue amlodipine ?

## 2020-10-13 NOTE — Assessment & Plan Note (Signed)
Stable period on Lunesta - refilled.

## 2020-10-19 ENCOUNTER — Other Ambulatory Visit: Payer: Self-pay | Admitting: Physician Assistant

## 2020-10-19 NOTE — Telephone Encounter (Signed)
Next Visit: 02/23/2021  Last Visit: 09/17/2020  Last Fill: 05/27/2020  Dx: Fibromyalgia  Current Dose per office note on 09/17/2020, Lyrica 150 mg 1 capsule by mouth twice daily.  Okay to refill Lyrica?

## 2020-10-20 ENCOUNTER — Ambulatory Visit (INDEPENDENT_AMBULATORY_CARE_PROVIDER_SITE_OTHER): Payer: Medicare Other | Admitting: Gastroenterology

## 2020-10-20 ENCOUNTER — Encounter: Payer: Self-pay | Admitting: Gastroenterology

## 2020-10-20 VITALS — BP 136/82 | HR 94 | Ht 64.0 in | Wt 129.0 lb

## 2020-10-20 DIAGNOSIS — K219 Gastro-esophageal reflux disease without esophagitis: Secondary | ICD-10-CM | POA: Diagnosis not present

## 2020-10-20 DIAGNOSIS — R1310 Dysphagia, unspecified: Secondary | ICD-10-CM

## 2020-10-20 DIAGNOSIS — K5900 Constipation, unspecified: Secondary | ICD-10-CM

## 2020-10-20 DIAGNOSIS — K831 Obstruction of bile duct: Secondary | ICD-10-CM | POA: Diagnosis not present

## 2020-10-20 NOTE — Progress Notes (Signed)
    History of Present Illness: This is a 65 year old female returning for follow-up of GERD, dysphagia, constipation and biliary stasis.  Reflux symptoms are under good control on pantoprazole 40 mg twice daily.  She has had worsening of her dysphagia over the past year or so.  Last EGD was performed in March 2018 with no stricture noted and empiric dilation was performed.  She was diagnosed with limited systemic sclerosis and [name].  Her constipation has gradually worsened frequently requiring Senokot and milk of magnesia despite taking MiraLAX, Linzess and Movantik on a daily basis.  Her last colonoscopy was performed in August 2019 and was normal.  She states she has tried Amitiza in the past and has not been effective.  She relates her morphine dose was decreased however her constipation is worsening.  Current Medications, Allergies, Past Medical History, Past Surgical History, Family History and Social History were reviewed in [employer] record.   Physical Exam: General: Well developed, well nourished, no acute distress Head: Normocephalic and atraumatic Eyes: Sclerae anicteric, EOMI Ears: Normal auditory acuity Mouth: Not examined, mask on during Covid-19 pandemic Lungs: Clear throughout to auscultation Heart: Regular rate and rhythm; no murmurs, rubs or bruits Abdomen: Soft, non tender and non distended. No masses, hepatosplenomegaly or hernias noted. Normal Bowel sounds Rectal: Not done Musculoskeletal: Symmetrical with no gross deformities  Pulses:  Normal pulses noted Extremities: No clubbing, cyanosis, edema or deformities noted Neurological: Alert oriented x 4, grossly nonfocal Psychological:  Alert and cooperative. Normal mood and affect   Assessment and Recommendations:  1. Biliary stasis, recurrent CBD stones.  Recent LFTs were normal.  No biliary symptoms.  Continue usodiol 300 mg po bid.   2. GERD. Dysphagia. R/O motility disorder.  Follow  antireflux measures.  Continue pantoprazole 40 mg daily.  Adjust diet to foods that are more easily swallowed.  Recommended proceeding with barium esophagram and esophageal manometry study.  She is reluctant to schedule the esophageal manometry study and agrees to proceed with barium esophagram. REV in 2 months.   3. Constipation, refractory.  Continue Linzess 290 mcg daily.  Continue MiraLAX 3 times daily.  Begin Motegrity 2 mg daily.  Temporarily discontinue Movantik.  Continue Senokot and MOM as rescue medications.  Consider trial of Zelnorm if Motegrity is not effective. ERV in 2 months.

## 2020-10-20 NOTE — Patient Instructions (Addendum)
We have sent the following medications to your pharmacy for you to pick up at your convenience: Motegrity.   Another option is Zelnorm 6 mg. Not sure if this is covered with your insurance company.   You will be contacted by [facility] in the next 2 days to arrange a Barium Swallow.  The number on your caller ID will be [phone], please answer when they call.  If you have not heard from them in 2 days please call [phone] to schedule.    Thank you for choosing me and [facility].  Pricilla Riffle. Dagoberto Ligas., MD., Marval Regal

## 2020-10-26 ENCOUNTER — Other Ambulatory Visit: Payer: Self-pay

## 2020-10-26 MED ORDER — TEGASEROD MALEATE 6 MG PO TABS: 6.0000 mg | ORAL_TABLET | Freq: Two times a day (BID) | ORAL | 1 refills | Status: DC

## 2020-10-27 ENCOUNTER — Telehealth: Payer: Self-pay

## 2020-10-27 NOTE — Telephone Encounter (Signed)
Informed patient that medication was not approved. Patient states she received a e-mail stating that our office can appeal the denial if needed. Informed patient I will work on the appeal for Zelnorm.

## 2020-10-27 NOTE — Telephone Encounter (Signed)
After initiating PA for Zelnorm on cover my meds, received fax today that Zelnorm was denied. According to the denial letter this medication is not a Part-D eligible medication as defined by the Medicare Part D benefit and is not covered under her Part D prescription drug plan. Unfortunately, this means patient would have to pay out of pocket for the medication to get it.

## 2020-10-29 ENCOUNTER — Encounter: Payer: Self-pay | Admitting: *Deleted

## 2020-10-29 NOTE — Telephone Encounter (Signed)
Faxed appeal letter to [facility]. Will await response.

## 2020-10-30 ENCOUNTER — Ambulatory Visit (HOSPITAL_COMMUNITY)
Admission: RE | Admit: 2020-10-30 | Discharge: 2020-10-30 | Disposition: A | Discharge status: Home / Self Care | Payer: Medicare Other | Source: Ambulatory Visit | Attending: Gastroenterology | Admitting: Gastroenterology

## 2020-10-30 ENCOUNTER — Other Ambulatory Visit: Payer: Self-pay

## 2020-10-30 DIAGNOSIS — R1310 Dysphagia, unspecified: Secondary | ICD-10-CM | POA: Insufficient documentation

## 2020-11-02 NOTE — Telephone Encounter (Signed)
Re-faxed appeal letter

## 2020-11-03 ENCOUNTER — Telehealth: Payer: Self-pay | Admitting: *Deleted

## 2020-11-03 NOTE — Telephone Encounter (Signed)
This medication or product is on patient's plan's list of covered drugs.  PA is not required at this time.Rachel Duran

## 2020-11-03 NOTE — Telephone Encounter (Signed)
Received fax from [facility] stating PA is set to expire soon on Movantik 25 mg.  PA completed on CoverMyMeds and sent to [facility] for review.  Can take up to 72 hours for a decision.

## 2020-11-10 ENCOUNTER — Other Ambulatory Visit: Payer: Self-pay | Admitting: Rheumatology

## 2020-11-10 NOTE — Telephone Encounter (Signed)
Next Visit: 02/23/2021 Last Visit: 09/17/2020  Last Fill: 07/13/2020  Dx: Limited systemic sclerosis   Current Dose per office note on 09/17/2020, not mentioned  Okay to refill Voltaren Gel?

## 2020-11-16 ENCOUNTER — Other Ambulatory Visit: Payer: Self-pay | Admitting: Obstetrics and Gynecology

## 2020-11-16 DIAGNOSIS — Z1231 Encounter for screening mammogram for malignant neoplasm of breast: Secondary | ICD-10-CM

## 2020-11-16 NOTE — Telephone Encounter (Signed)
Spoke with Glenard Haring at [facility] and he states that they are still not approving medication because it is not on patient's plan at all. That the medication did not need a PA because it is just not covered under the patient's plan. Glenard Haring informed me that I can try a formulary exception by going to the [employer]/medicare part D website. I went to website and printed a request for medicare prescription drug coverage determination. I filled out and faxed to fax number provided.

## 2020-11-18 ENCOUNTER — Encounter: Payer: Self-pay | Admitting: Family Medicine

## 2020-11-23 MED ORDER — MOTEGRITY 2 MG PO TABS: 1.0000 | ORAL_TABLET | Freq: Every day | ORAL | 1 refills | Status: DC

## 2020-11-23 NOTE — Telephone Encounter (Signed)
Informed patient her drug plan still will not cover Zelnorm. Patient verbalized understanding. Patient states even though she thinks she has tried Motegrity in the past, she would like to try the medication again. Patient states she believed it worked for a while and then slowly decreased in effectiveness. Informed patient I will send Motegrity to her pharmacy for her to try and let our office know if this medication does not work. Patient verbalized understanding.

## 2020-11-25 ENCOUNTER — Telehealth: Payer: Self-pay

## 2020-11-25 NOTE — Telephone Encounter (Signed)
Received fax notice from [facility] that Movantik 25 mg tab PA is expiring.    Submitted PA; keyAzzie Almas, [employer] case ID:  [id number].  Decision pending.

## 2020-11-27 NOTE — Telephone Encounter (Signed)
Again received faxed notice stating this medication is on pt's plan of covered drugs.  PA is not required at this time.   Will inform pt via MyChart.

## 2020-12-02 ENCOUNTER — Other Ambulatory Visit: Payer: Self-pay | Admitting: Physician Assistant

## 2020-12-02 ENCOUNTER — Other Ambulatory Visit: Payer: Self-pay | Admitting: Family Medicine

## 2020-12-02 MED ORDER — AMLODIPINE BESYLATE 5 MG PO TABS: 5.0000 mg | ORAL_TABLET | Freq: Every day | ORAL | 0 refills | Status: DC

## 2020-12-02 NOTE — Telephone Encounter (Signed)
Refill request Adderall Last refill 09/03/20 #90 Last office visit 10/13/20

## 2020-12-02 NOTE — Telephone Encounter (Signed)
ERx 

## 2020-12-02 NOTE — Telephone Encounter (Signed)
Next Visit: 02/23/2021  Last Visit: 09/17/2020  Last Fill: 09/17/2020  Dx: [name] disease without gangrene:  Current Dose per office note on 09/17/2020,  amlodipine 2.5 mg daily will be sent to the pharmacy to take in conjunction with the 5 mg tablets for a total of 7.5 mg daily  Okay to refill Norvasc?

## 2020-12-08 ENCOUNTER — Other Ambulatory Visit: Payer: Self-pay | Admitting: Gastroenterology

## 2020-12-11 ENCOUNTER — Ambulatory Visit: Payer: Self-pay

## 2020-12-11 ENCOUNTER — Encounter: Payer: Self-pay | Admitting: Orthopedic Surgery

## 2020-12-11 ENCOUNTER — Ambulatory Visit (INDEPENDENT_AMBULATORY_CARE_PROVIDER_SITE_OTHER): Payer: Medicare Other | Admitting: Orthopedic Surgery

## 2020-12-11 ENCOUNTER — Other Ambulatory Visit: Payer: Self-pay

## 2020-12-11 DIAGNOSIS — M25561 Pain in right knee: Secondary | ICD-10-CM | POA: Diagnosis not present

## 2020-12-11 DIAGNOSIS — M2241 Chondromalacia patellae, right knee: Secondary | ICD-10-CM | POA: Diagnosis not present

## 2020-12-11 DIAGNOSIS — G8929 Other chronic pain: Secondary | ICD-10-CM

## 2020-12-11 DIAGNOSIS — M7651 Patellar tendinitis, right knee: Secondary | ICD-10-CM | POA: Diagnosis not present

## 2020-12-11 NOTE — Progress Notes (Signed)
Office Visit Note   Patient: Jemeka Wagler           Date of Birth: April 27, 1956           MRN: [id number] Visit Date: 12/11/2020 Requested by: Ria Bush, MD [place],  [place] [id number] PCP: Ria Bush, MD  Subjective: Chief Complaint  Patient presents with  . Right Knee - Pain    HPI: Matthew Pais is a 65 y.o. female who presents to the office complaining of right knee pain.  Patient has history of right knee patellofemoral arthroplasty on 06/01/2020.  She complains of increased pain and tightness.  Localizes most of her right knee pain to the anterior aspect of the right knee just distal to the patella.  This has been worse over the last couple months without any sort of injury.  Throughout the day does not really cause her any discomfort until about 4 PM and then she notices increased pain.  She is able to do what she wants throughout the day prior to the late afternoon and goes on to mild dog walks multiple times throughout the week as well as going on extended shopping trips.  Going up and down the stairs has been worse over the last few months.  She does have history of chronic pain for which she takes MS IR 15 mg twice daily.  She was doing well while she was doing a dedicated exercise program at her local gym but over the last 6 to 7 weeks she is really only been doing walking without any formal exercises.  Denies any fevers, chills, night sweats, drainage from the incision..                ROS: All systems reviewed are negative as they relate to the chief complaint within the history of present illness.  Patient denies fevers or chills.  Assessment & Plan: Visit Diagnoses:  1. Patellar tendinitis of right knee   2. Chronic pain of right knee   3. Chondromalacia patellae, right knee     Plan: Patient is a 65 year old female who presents complaining of right knee pain.  She has history of right knee patellofemoral arthroplasty back in  November 2021.  Doing well following that procedure and while she was doing physical therapy but after discontinuing therapy she has had worsening pain over the last several months.  Radiographs taken today show excellent position of the prosthesis with no change since prior radiographs.  There is a small osteophyte that is more prominent off the inferior pole of the patella when compared with prior radiographs.  Impression is patient's pain is most related to patellar tendinitis with some possible contribution from the small inferior pole osteophyte.  No compromise of the hardware in place and there is no evidence of prosthetic joint infection.  Plan to have patient continue using topical anti-inflammatory, take Aleve daily, use exercise bike for 15 minutes 3 times per week.  Follow-up in 10 weeks for clinical recheck.  Follow-Up Instructions: No follow-ups on file.   Orders:  Orders Placed This Encounter  Procedures  . XR Knee 1-2 Views Right   No orders of the defined types were placed in this encounter.     Procedures: No procedures performed   Clinical Data: No additional findings.  Objective: Vital Signs: LMP 07/25/1998   Physical Exam:  Constitutional: Patient appears well-developed HEENT:  Head: Normocephalic Eyes:EOM are normal Neck: Normal range of motion Cardiovascular: Normal rate  Pulmonary/chest: Effort normal Neurologic: Patient is alert Skin: Skin is warm Psychiatric: Patient has normal mood and affect  Ortho Exam: Ortho exam demonstrates right knee without effusion.  Well-healed incision from prior patellofemoral arthroplasty.  No laxity with cytocide motion of the patella.  3 degrees of knee extension.  Greater than 125 degrees of knee flexion.  Able to perform straight leg raise with mild pain.  Excellent quadricep strength but increased pain in the anterior aspect of the knee during quad strength testing.  No tenderness over the proximal or midportion of the  patella but she does have some mild to moderate tenderness over the inferior pole of the patella.  She also has tenderness throughout the talar tendon with no defect that is palpable.  No significant medial or lateral joint line tenderness.  No pain with hip range of motion.  Specialty Comments:  No specialty comments available.  Imaging: No results found.   PMFS History: Patient Active Problem List   Diagnosis Date Noted  . Osteopenia 07/02/2020  . S/P knee surgery 06/01/2020  . Pre-op evaluation 05/27/2020  . Pulmonary nodule 05/27/2020  . 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[place]) 05/05/2020  . Chronic patellofemoral pain of right knee 05/05/2020  . Positive ANA (antinuclear antibody) 06/05/2019  . 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  . Unilateral primary osteoarthritis, left hip 01/28/2019  . Pelvic pain 12/12/2018  . Common bile duct (CBD) obstruction   . Venous insufficiency of left lower extremity 07/04/2018  . Dyslipidemia 09/22/2017  . DDD (degenerative disc disease), cervical 04/03/2017  . DDD (degenerative disc disease), lumbar 04/03/2017  . Onychomycosis 03/21/2017  . Dysphagia 10/18/2016  . Encounter for chronic pain management 10/18/2016  . Biliary stasis 09/26/2016  . Fever blister 08/18/2016  . HNP (herniated nucleus pulposus), lumbar 09/23/2015  . Sinus congestion 07/30/2015  . Iron deficiency 07/30/2015  . Health maintenance examination 06/15/2015  . 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 Headache 03/09/2015  . Clavicle enlargement 12/13/2014  . Prediabetes 12/13/2014  . Plantar fasciitis, bilateral 09/11/2014  . Advanced care planning/counseling discussion 06/10/2014  . Medicare annual wellness visit, subsequent 06/10/2014  . Abnormal thyroid function test 06/10/2014  . Chronic pain syndrome 06/10/2014  . CFS (chronic fatigue syndrome) 04/08/2014  . Postmenopausal atrophic vaginitis 10/19/2012  . 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  Cervical disc disorder with radiculopathy of cervical region 05/28/2010  . CHEST PAIN UNSPECIFIED 02/28/2008  . APHTHOUS ULCERS 01/31/2008  . Chronic insomnia 11/09/2007  . Drug-induced constipation 10/11/2007  . Allergic rhinitis 04/12/2007  . MDD (major depressive disorder), recurrent episode, moderate ([place]) 03/05/2007  . [name] syndrome 03/05/2007  . GERD 03/05/2007  . ROSACEA 03/05/2007  . NEURALGIA 03/05/2007  . 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 Chronic interstitial cystitis 12/06/2006  . Fibromyalgia 12/06/2006   Past Medical History:  Diagnosis Date  . Abdominal pain last 4 months   and nausea also  . Allergy   . Anemia    history of  . Anxiety   . Bipolar disorder ([place])    atpical bipolar disorder  . 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 C6- C7 -hx. past fusion(bone graft used)  . Cholecystitis   . Chronic pain   . DDD (degenerative disc disease), lumbar 09/2015   dextroscoliosis with multilevel DDD and facet arthrosis most notable for R foraminal disc protrusion L4/5 producing severe R neural foraminal stenosis abutting R L4 nerve root, moderate spinal canal and mild lat recesss  and R neural foraminal stenosis L3/4 (MRI)  . Depression    bipolar depression  . Disorders of porphyrin metabolism   . Felon of finger of left hand 11/10/2016  . Fibromyalgia   . GERD (gastroesophageal reflux disease)   . Headache    occasionally  . Hypertension   . Internal hemorrhoids   . Interstitial cystitis 06-06-12   hx.  . Irritable bowel syndrome   . PONV (postoperative nausea and vomiting)    now uses stomach blockers and no ponv  . 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 Raynauds disease    hx.  . Seronegative arthritis    [name]  . 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 CAD Father 30       MI, nonsmoker  . Esophageal cancer Father 45  . Stomach cancer Father   . Scleroderma Mother   . Hypertension Mother   . Esophageal cancer Paternal Grandfather   . Stomach cancer Paternal Grandfather   . Diabetes Maternal Grandmother   . Arthritis Brother   . Stroke Neg Hx   . Colon cancer Neg Hx   . Rectal cancer Neg Hx     Past Surgical History:  Procedure Laterality Date  . ANTERIOR CERVICAL DECOMP/DISCECTOMY FUSION  2004   C5/6, C6/7  . ANTERIOR CERVICAL DECOMP/DISCECTOMY FUSION  02/2016   C3/4, C4/5 with plating [name])  . AUGMENTATION MAMMAPLASTY Bilateral 03/25/2010  . BREAST ENHANCEMENT SURGERY  2010  . BREAST IMPLANT EXCHANGE  10/2014   exchange saline implants, B mastopexy/capsulorraphy ([place] [facility])  . BUNIONECTOMY Bilateral yrs ago  . [place]   x 1  . CHOLECYSTECTOMY  06/11/2012   Procedure: LAPAROSCOPIC CHOLECYSTECTOMY WITH INTRAOPERATIVE CHOLANGIOGRAM;  Surgeon: [name], MD,FACS;  Location: [facility];  Service: General;  Laterality: N/A;  . COLONOSCOPY  02/2018   done for positive cologuard - WNL, rpt 10 yrs Fuller Plan)  . CYSTOSCOPY    . ERCP  05/22/2012   Procedure: ENDOSCOPIC RETROGRADE CHOLANGIOPANCREATOGRAPHY (ERCP);  Surgeon: [name], MD,FACG;  Location: [facility];  Service: Endoscopy;  Laterality: N/A;  . ERCP N/A 09/17/2013   Procedure: ENDOSCOPIC RETROGRADE CHOLANGIOPANCREATOGRAPHY (ERCP);  Surgeon: [name], MD;  Location: [facility];  Service: Endoscopy;  Laterality: N/A;  . ERCP N/A 09/27/2018   Procedure: ENDOSCOPIC RETROGRADE CHOLANGIOPANCREATOGRAPHY (ERCP);  Surgeon: [name], MD;  Location: [facility];  Service: Endoscopy;  Laterality: N/A;  . ESOPHAGOGASTRODUODENOSCOPY  09/2016   WNL. esophagus dilated [facility] Plan)  . HEMORRHOID BANDING  09-23-13   --Dr. Greer Pickerel  . HERNIA REPAIR      inguinal  . HYSTEROSCOPY W/ ENDOMETRIAL ABLATION    . NASAL SINUS SURGERY     x5  . PARTIAL KNEE ARTHROPLASTY Right 06/01/2020   Procedure: Right knee patellofemoral replacement;  Surgeon: [name], MD;  Location: [facility];  Service: Orthopedics;  Laterality: Right;  . REMOVAL OF STONES  09/27/2018   Procedure: REMOVAL OF STONES;  Surgeon: [name], MD;  Location: [facility];  Service: Endoscopy;;  . [name]  09/27/2018   Procedure: SPHINCTEROTOMY;  Surgeon: [name], MD;  Location: [facility];  Service: Endoscopy;;  . TOTAL HIP ARTHROPLASTY  Left 03/12/2019   Procedure: LEFT TOTAL HIP ARTHROPLASTY ANTERIOR APPROACH;  [name], [name], MD)  . UPPER GASTROINTESTINAL ENDOSCOPY     Social History   Occupational History  . Occupation: retired    Fish farm manager: UNEMPLOYED  Tobacco Use  . Smoking status: Never Smoker  . Smokeless tobacco: Never Used  Vaping Use  . Vaping Use: Never used  Substance and Sexual Activity  . Alcohol use: No    Alcohol/week: 0.0 standard drinks  . Drug use: No  . Sexual activity: Yes    Partners: Male    Birth control/protection: Post-menopausal    Comment: vasectomy

## 2020-12-12 ENCOUNTER — Encounter: Payer: Self-pay | Admitting: Orthopedic Surgery

## 2020-12-15 ENCOUNTER — Other Ambulatory Visit: Payer: Self-pay | Admitting: Family Medicine

## 2020-12-15 NOTE — Telephone Encounter (Signed)
ERx 

## 2020-12-22 ENCOUNTER — Other Ambulatory Visit: Payer: Self-pay | Admitting: Family Medicine

## 2021-01-04 ENCOUNTER — Encounter: Payer: Self-pay | Admitting: Orthopedic Surgery

## 2021-01-04 MED ORDER — AMOXICILLIN 500 MG PO TABS: ORAL_TABLET | ORAL | 0 refills | Status: DC

## 2021-01-05 ENCOUNTER — Ambulatory Visit: Payer: Medicare Other

## 2021-01-06 ENCOUNTER — Other Ambulatory Visit: Payer: Self-pay

## 2021-01-06 ENCOUNTER — Ambulatory Visit
Admission: RE | Admit: 2021-01-06 | Discharge: 2021-01-06 | Disposition: A | Discharge status: Home / Self Care | Payer: Medicare Other | Source: Ambulatory Visit | Attending: Obstetrics and Gynecology | Admitting: Obstetrics and Gynecology

## 2021-01-06 DIAGNOSIS — Z1231 Encounter for screening mammogram for malignant neoplasm of breast: Secondary | ICD-10-CM

## 2021-01-11 ENCOUNTER — Other Ambulatory Visit: Payer: Self-pay | Admitting: Family Medicine

## 2021-01-12 NOTE — Telephone Encounter (Signed)
Name of Medication: Eszopiclone Name of Pharmacy: [facility] or Written Date and Quantity: 10/19/20, #90 Last Office Visit and Type: 10/13/20, chronic pain f/u Next Office Visit and Type: 01/13/21, 3 mo f/u Last Controlled Substance Agreement Date: 04/19/19 Last UDS: 04/19/19

## 2021-01-12 NOTE — Telephone Encounter (Signed)
ERx 

## 2021-01-13 ENCOUNTER — Encounter: Payer: Self-pay | Admitting: Family Medicine

## 2021-01-13 ENCOUNTER — Other Ambulatory Visit: Payer: Self-pay

## 2021-01-13 ENCOUNTER — Ambulatory Visit (INDEPENDENT_AMBULATORY_CARE_PROVIDER_SITE_OTHER): Payer: Medicare Other | Admitting: Family Medicine

## 2021-01-13 VITALS — BP 130/82 | HR 88 | Temp 98.4°F | Ht 64.0 in | Wt 126.4 lb

## 2021-01-13 DIAGNOSIS — M7918 Myalgia, other site: Secondary | ICD-10-CM | POA: Diagnosis not present

## 2021-01-13 DIAGNOSIS — G8929 Other chronic pain: Secondary | ICD-10-CM | POA: Diagnosis not present

## 2021-01-13 DIAGNOSIS — K5903 Drug induced constipation: Secondary | ICD-10-CM | POA: Diagnosis not present

## 2021-01-13 DIAGNOSIS — M5136 Other intervertebral disc degeneration, lumbar region: Secondary | ICD-10-CM

## 2021-01-13 DIAGNOSIS — M419 Scoliosis, unspecified: Secondary | ICD-10-CM | POA: Insufficient documentation

## 2021-01-13 DIAGNOSIS — I7300 Raynaud's syndrome without gangrene: Secondary | ICD-10-CM

## 2021-01-13 DIAGNOSIS — G894 Chronic pain syndrome: Secondary | ICD-10-CM

## 2021-01-13 DIAGNOSIS — M25561 Pain in right knee: Secondary | ICD-10-CM

## 2021-01-13 DIAGNOSIS — K831 Obstruction of bile duct: Secondary | ICD-10-CM

## 2021-01-13 MED ORDER — MORPHINE SULFATE 15 MG PO TABS: ORAL_TABLET | ORAL | 0 refills | Status: DC

## 2021-01-13 NOTE — Assessment & Plan Note (Signed)
Recurrent R knee pain attributed to new bone spur - considering repeat surgery.

## 2021-01-13 NOTE — Assessment & Plan Note (Signed)
Continue ursodiol BID followed by GI.

## 2021-01-13 NOTE — Assessment & Plan Note (Addendum)
[place] CSRS reviewed Stable period on MSIR 15mg  2-3 tab daily - continue. Refilled today.

## 2021-01-13 NOTE — Progress Notes (Signed)
Patient ID: Rachel Duran, female    DOB: 20-May-1956, 65 y.o.   MRN: [id number]  This visit was conducted in person.  BP 130/82   Pulse 88   Temp 98.4 F (36.9 C) (Temporal)   Ht 5\' 4"  (1.626 m)   Wt 126 lb 6 oz (57.3 kg)   LMP 07/25/1998   SpO2 98%   BMI 21.69 kg/m    CC:  34mo pain management f/u visit  Subjective:   HPI: [name] is a 65 y.o. female presenting on 01/13/2021 for Pain Management (Here for 3 mo f/u.)   Longstanding chronic pain managed with opiate - now on morphine MSIR 15mg  TID PRN #90/month (regularly BID occasionally TID). Tolerating well without oversedation or unsteadiness with ambulation. See below regarding chronic constipation.   S/p R knee patellofemoral replacement for chondromalacia of R patella Marlou Sa) 05/2020. Saw ortho and neurosurgery in follow up since last visit here. Dr [name] is monitoring scoliosis hopeful not to progress - continues recommending against surgery. Notes 2 wk h/o R back pain medial to shoulder blade with radiation up into neck. Denies inciting trauma/injury or falls. She is planning to try tizanidine she has at home.   New bone spur to right knee causing pain - . Has been recommended to ride stationary bicycle 3x/wk.   Saw GI - biliary stasis in h/o recurrent CBD stones stable period on ursodiol 300mg  bid. GERD with dysphagia r/o motility disorder rec continued pantoprazole 40mg  daily and planned barium esophagram and esoph manometry. Constipation - continue linzess, miralax. Motegrity trial didn't help so back on movantik. Zelnorm was not covered at all by insurance. Continue senokot and MOM as needed.   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 No evidence of interstitial lung disease, normal PFTs, planned annual monitoring. rec avoiding systemic steroids.     Relevant past medical, surgical, family and social history  reviewed and updated as indicated. Interim medical history since our last visit reviewed. Allergies and medications reviewed and updated. Outpatient Medications Prior to Visit  Medication Sig Dispense Refill   ALPRAZolam (XANAX) 1 MG tablet Take 0.5 mg by mouth at bedtime.  0   amLODipine (NORVASC) 2.5 MG tablet TAKE 1 TABLET BY MOUTH DAILY WITH AMLODIPINE 5 MG TABLET. (7.5 MG TOTAL DAILY. 90 tablet 0   amLODipine (NORVASC) 5 MG tablet Take 1 tablet (5 mg total) by mouth daily. 90 tablet 0   amoxicillin (AMOXIL) 500 MG tablet Take 4 tablets (2,000mg ) by mouth 30-60 minutes prior to dental cleaning. 4 tablet 0   amphetamine-dextroamphetamine (ADDERALL XR) 30 MG 24 hr capsule TAKE 1 CAPSULE BY MOUTH EVERY MORNING 90 capsule 0   ARIPiprazole (ABILIFY) 5 MG tablet Take 5 mg by mouth daily.     aspirin 81 MG chewable tablet Chew 1 tablet (81 mg total) by mouth daily. 30 tablet 0   cyanocobalamin (,VITAMIN B-12,) 1000 MCG/ML injection INJECT 1ML INTO THE MUSCLE EVERY 21 DAYSAS DIRECTED 3 mL 3   diclofenac Sodium (VOLTAREN) 1 % GEL APPLY TWO TO FOUR GRAMS TO AFFECTED JOINT UP TO FOUR TIMES A DAY 400 g 2   Eszopiclone 3 MG TABS TAKE 1 TABLET BY MOUTH AT BEDTIME TAKE IMMEDIATLEY BEFORE BEDTIME AS DIRECTED. 90 tablet 0   ferrous sulfate 325 (65 FE) MG tablet Take 325 mg by mouth every Monday, Wednesday, and Friday.      fluticasone (FLONASE) 50  MCG/ACT nasal spray Place 1 spray into both nostrils daily as needed for allergies. 16 g 5   lamoTRIgine (LAMICTAL) 100 MG tablet Take 100 mg by mouth daily.     LINZESS 290 MCG CAPS capsule TAKE 1 CAPSULE BY MOUTH DAILY 90 capsule 1   MOVANTIK 25 MG TABS tablet Take 25 mg by mouth daily.     NONFORMULARY OR COMPOUNDED ITEM Testosterone propionate 2% in white petrolatum, apply small amount once daily for 5 days per week. 60 each 0   ondansetron (ZOFRAN) 4 MG tablet TAKE ONE TABLET EVERY EIGHT HOURS AS NEEDED FOR NAUSEA / VOMITING 30 tablet 0   ondansetron  (ZOFRAN-ODT) 4 MG disintegrating tablet TAKE 1 TABLET BY MOUTH EVERY 8 HOURS AS NEEDED FOR NAUSEA OR VOMITING 20 tablet 1   pantoprazole (PROTONIX) 40 MG tablet TAKE 1 TABLET BY MOUTH TWICE DAILY WITH A MEAL 180 tablet 1   pentosan polysulfate (ELMIRON) 100 MG capsule Take 200 mg by mouth 2 (two) times daily.      phenazopyridine (PYRIDIUM) 100 MG tablet Take by mouth as needed.      polyethylene glycol (MIRALAX / GLYCOLAX) 17 g packet Take 17 g by mouth 3 (three) times daily.     Prasterone (INTRAROSA) 6.5 MG INST Place 1 suppository vaginally at bedtime. 84 each 3   pregabalin (LYRICA) 150 MG capsule TAKE 1 CAPSULE BY MOUTH TWICE DAILY 180 capsule 0   PREVIDENT 5000 BOOSTER PLUS 1.1 % PSTE See admin instructions.     senna (SENOKOT) 8.6 MG tablet Take 2 tablets by mouth daily as needed.      tiZANidine (ZANAFLEX) 2 MG tablet Take 1 tablet (2 mg total) by mouth every 8 (eight) hours as needed. 30 tablet 0   UNABLE TO FIND Med Name: Vit D and Calcium powder. Mix in 8 oz of water qd     ursodiol (ACTIGALL) 300 MG capsule TAKE 1 CAPSULE BY MOUTH 2 TIMES DAILY. 180 capsule 3   morphine (MSIR) 15 MG tablet TAKE ONE TABLET BY MOUTH 3 TIMES DAILY AS NEEDED FOR MODERATE OR SEVERE PAIN 90 tablet 0   amoxicillin (AMOXIL) 500 MG tablet Take 2g 1 hour prior to dental procedure 10 tablet 0   Prucalopride Succinate (MOTEGRITY) 2 MG TABS Take 1 tablet (2 mg total) by mouth daily. 30 tablet 1   tegaserod (ZELNORM) 6 MG tablet Take 1 tablet (6 mg total) by mouth 2 (two) times daily before a meal. 60 tablet 1   No facility-administered medications prior to visit.     Per HPI unless specifically indicated in ROS section below Review of Systems Objective:  BP 130/82   Pulse 88   Temp 98.4 F (36.9 C) (Temporal)   Ht 5\' 4"  (1.626 m)   Wt 126 lb 6 oz (57.3 kg)   LMP 07/25/1998   SpO2 98%   BMI 21.69 kg/m   Wt Readings from Last 3 Encounters:  01/13/21 126 lb 6 oz (57.3 kg)  10/20/20 129 lb (58.5 kg)   10/13/20 129 lb (58.5 kg)      Physical Exam Vitals and nursing note reviewed.  Constitutional:      Appearance: Normal appearance. She is not ill-appearing.  Cardiovascular:     Rate and Rhythm: Normal rate and regular rhythm.     Pulses: Normal pulses.     Heart sounds: Normal heart sounds. No murmur heard. Pulmonary:     Effort: Pulmonary effort is normal. No respiratory distress.  Breath sounds: No wheezing.  Musculoskeletal:        General: Tenderness present.     Right lower leg: No edema.     Left lower leg: No edema.     Comments:  Mild midline thoracic spine tenderness (chronic) Thoracic scoliosis evident  Discomfort to palpation to R rhomboids FROM at shoulders  Skin:    Findings: No rash.  Neurological:     Mental Status: She is alert.  Psychiatric:        Mood and Affect: Mood normal.        Behavior: Behavior normal.      Assessment & Plan:  This visit occurred during the SARS-CoV-2 public health emergency.  Safety protocols were in place, including screening questions prior to the visit, additional usage of staff PPE, and extensive cleaning of exam room while observing appropriate contact time as indicated for disinfecting solutions.   Problem List Items Addressed This Visit     Encounter for chronic pain management - Primary (Chronic)     CSRS reviewed Stable period on MSIR 15mg  2-3 tab daily - continue. Refilled today.        [name] syndrome    Doing well on amlodipine 7.5mg  daily through rheumatology.        Drug-induced constipation    Seeing GI for this. Appreciate Dr Lynne Leader care.  Continues linzess, miralax, with MOM and sennakot PRN. Insurance did not approve Zelnorm. She is back on movantik as well - motegrity was not effective.        Chronic pain syndrome    FM, OA, IC, DDD all contribute.  Continue lyrica 150mg  bid.        Relevant Medications   morphine (MSIR) 15 MG tablet   Biliary stasis    Continue ursodiol BID  followed by GI.        DDD (degenerative disc disease), lumbar   Relevant Medications   morphine (MSIR) 15 MG tablet   Chronic patellofemoral pain of right knee    Recurrent R knee pain attributed to new bone spur - considering repeat surgery.        Relevant Medications   morphine (MSIR) 15 MG tablet   Scoliosis   Pain of rhomboid muscle    Exam suspicious for R rhomboid strain  Supportive care measures reviewed - heating pad, voltaren gel, provided with rhomboid strain exercises from [facility] pt advisor.          Meds ordered this encounter  Medications   morphine (MSIR) 15 MG tablet    Sig: TAKE ONE TABLET BY MOUTH 3 TIMES DAILY AS NEEDED FOR MODERATE OR SEVERE PAIN    Dispense:  90 tablet    Refill:  0   No orders of the defined types were placed in this encounter.   Follow up plan: Return in about 3 months (around 04/15/2021) for follow up visit.  Ria Bush, MD

## 2021-01-13 NOTE — Assessment & Plan Note (Signed)
Doing well on amlodipine 7.5mg  daily through rheumatology.

## 2021-01-13 NOTE — Assessment & Plan Note (Signed)
Seeing GI for this. Appreciate Dr Lynne Leader care.  Continues linzess, miralax, with MOM and sennakot PRN. Insurance did not approve Zelnorm. She is back on movantik as well - motegrity was not effective.

## 2021-01-13 NOTE — Patient Instructions (Addendum)
Possible right sided rhomboid strain - treat with heating pad, muscle relaxant at home, and try exercises provided today. May also use voltaren gel.  Good to see you today.  Return as needed or in 3 months for follow up visit.

## 2021-01-13 NOTE — Assessment & Plan Note (Signed)
Exam suspicious for R rhomboid strain  Supportive care measures reviewed - heating pad, voltaren gel, provided with rhomboid strain exercises from [facility] pt advisor.

## 2021-01-13 NOTE — Assessment & Plan Note (Signed)
FM, OA, IC, DDD all contribute.  Continue lyrica 150mg  bid.

## 2021-01-26 ENCOUNTER — Other Ambulatory Visit: Payer: Self-pay

## 2021-01-26 NOTE — Telephone Encounter (Signed)
Last AEX 02/06/20 Scheduled 04/29/21

## 2021-01-27 MED ORDER — NONFORMULARY OR COMPOUNDED ITEM: 0 refills | Status: DC

## 2021-01-27 NOTE — Telephone Encounter (Signed)
Called into pharmacy

## 2021-02-04 ENCOUNTER — Encounter: Payer: Self-pay | Admitting: Family Medicine

## 2021-02-05 MED ORDER — TIZANIDINE HCL 2 MG PO TABS: 2.0000 mg | ORAL_TABLET | Freq: Three times a day (TID) | ORAL | 0 refills | Status: DC | PRN

## 2021-02-06 ENCOUNTER — Other Ambulatory Visit: Payer: Self-pay | Admitting: Family Medicine

## 2021-02-08 NOTE — Telephone Encounter (Signed)
Last OV - 01/13/2021 Next OV - 04/16/2021 Last Filled -  Ondansteron (Zofran) - 12/23/2020 Cyanocobalamin - 05/13/21

## 2021-02-09 ENCOUNTER — Encounter: Payer: Self-pay | Admitting: Family Medicine

## 2021-02-09 NOTE — Progress Notes (Signed)
Office Visit Note  Patient: Rachel Duran             Date of Birth: [birth year]           MRN: [id number]             PCP: Ria Bush, MD Referring: Ria Bush, MD Visit Date: 02/23/2021 Occupation: @GUAROCC @  Subjective:  Arthritis (Doing good)   History of Present Illness: Tammee Thielke is a 65 y.o. female with history of limited systemic sclerosis and osteoarthritis.  She states she had right partial knee replacement which continues to bother her.  She states she had an appointment with Dr. Marlou Sa and was told that she has a spur in her knee and will need another surgery in the near future.  She states she has been limping because of her knee which puts a strain on her back.  She also believes that her scoliosis is getting worse.  She was given some exercises by her PCP.  She denies any increased tightness in her skin.  Her Raynauds is manageable.  There is no history of digital ulcers.  She denies any increased shortness of breath.  She has an appointment coming up with the pulmonologist and the cardiologist.    Activities of Daily Living:  Patient reports morning stiffness for 2 hours.   Patient Reports nocturnal pain.  Difficulty dressing/grooming: Reports Difficulty climbing stairs: Reports Difficulty getting out of chair: Reports Difficulty using hands for taps, buttons, cutlery, and/or writing: Reports  Review of Systems  Constitutional:  Positive for fatigue.  HENT:  Positive for mouth dryness.   Eyes:  Positive for dryness.  Respiratory:  Negative for shortness of breath.   Cardiovascular:  Negative for swelling in legs/feet.  Gastrointestinal:  Positive for constipation.  Endocrine: Positive for cold intolerance.  Genitourinary:  Negative for difficulty urinating.  Musculoskeletal:  Positive for joint pain, gait problem, joint pain, muscle weakness, morning stiffness and muscle tenderness.  Skin:  Negative for rash.  Allergic/Immunologic:  Negative for susceptible to infections.  Neurological:  Positive for numbness and weakness.  Hematological:  Positive for bruising/bleeding tendency.  Psychiatric/Behavioral:  Positive for sleep disturbance.    PMFS History:  Patient Active Problem List   Diagnosis Date Noted   Scoliosis 01/13/2021   Pain of rhomboid muscle 01/13/2021   Osteopenia 07/02/2020   S/P knee surgery 06/01/2020   Pre-op evaluation 05/27/2020   Pulmonary nodule 05/27/2020   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[place]) 05/05/2020   Chronic patellofemoral pain of right knee 05/05/2020   Positive ANA (antinuclear antibody) 06/05/2019   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   Unilateral primary osteoarthritis, left hip 01/28/2019   Pelvic pain 12/12/2018   Common bile duct (CBD) obstruction    Venous insufficiency of left lower extremity 07/04/2018   Dyslipidemia 09/22/2017   DDD (degenerative disc disease), cervical 04/03/2017   DDD (degenerative disc disease), lumbar 04/03/2017   Onychomycosis 03/21/2017   Dysphagia 10/18/2016   Encounter for chronic pain management 10/18/2016   Biliary stasis 09/26/2016   Fever blister 08/18/2016   HNP (herniated nucleus pulposus), lumbar 09/23/2015   Sinus congestion 07/30/2015   Iron deficiency 07/30/2015   Health maintenance examination 06/15/2015   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   Headache 03/09/2015   Clavicle enlargement 12/13/2014   Prediabetes 12/13/2014   Plantar fasciitis, bilateral 09/11/2014   Advanced care planning/counseling discussion 06/10/2014   Medicare annual wellness visit, subsequent 06/10/2014   Abnormal thyroid function test 06/10/2014  Chronic pain syndrome 06/10/2014   CFS (chronic fatigue syndrome) 04/08/2014   Postmenopausal atrophic vaginitis 10/19/2012   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   Cervical disc disorder with radiculopathy of cervical region 05/28/2010   CHEST PAIN UNSPECIFIED 02/28/2008   APHTHOUS ULCERS  01/31/2008   Chronic insomnia 11/09/2007   Drug-induced constipation 10/11/2007   Allergic rhinitis 04/12/2007   MDD (major depressive disorder), recurrent episode, moderate ([place]) 03/05/2007   [name] syndrome 03/05/2007   GERD 03/05/2007   ROSACEA 03/05/2007   NEURALGIA 03/05/2007   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 C6- C7 -hx. past fusion(bone graft used)   Cholecystitis    Chronic pain    DDD (degenerative disc disease), lumbar 09/2015   dextroscoliosis with multilevel DDD and facet arthrosis most notable for R foraminal disc protrusion L4/5 producing severe R neural foraminal stenosis abutting R L4 nerve root, moderate spinal canal and mild lat recesss and R neural foraminal stenosis L3/4 (MRI)   Depression    bipolar depression   Disorders of porphyrin metabolism    Felon of finger of left hand 11/10/2016   Fibromyalgia    GERD (gastroesophageal reflux disease)    Headache    occasionally   Hypertension    Internal hemorrhoids    Interstitial cystitis 06-06-12   hx.   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   Seronegative arthritis    Kamylle Axelson   Stargardt's disease 05/2015   hereditary macular degeneration (Dr Baird Cancer retinologist)    Family History  Problem Relation Age of Onset   CAD Father 68       MI, nonsmoker   Esophageal cancer Father 1   Stomach  cancer Father    Scleroderma Mother    Hypertension Mother    Esophageal cancer Paternal Grandfather    Stomach cancer Paternal Grandfather    Diabetes Maternal Grandmother    Arthritis Brother    Stroke Neg Hx    Colon cancer Neg Hx    Rectal cancer Neg Hx    Past Surgical History:  Procedure Laterality Date   ANTERIOR CERVICAL DECOMP/DISCECTOMY FUSION  2004   C5/6, C6/7   ANTERIOR CERVICAL DECOMP/DISCECTOMY FUSION  02/2016   C3/4, C4/5 with plating [name])   AUGMENTATION MAMMAPLASTY Bilateral 03/25/2010   BREAST ENHANCEMENT SURGERY  2010   BREAST IMPLANT EXCHANGE  10/2014   exchange saline implants, B mastopexy/capsulorraphy ([facility])   BUNIONECTOMY Bilateral yrs ago   [place]   x 1   CHOLECYSTECTOMY  06/11/2012   Procedure: LAPAROSCOPIC CHOLECYSTECTOMY WITH INTRAOPERATIVE CHOLANGIOGRAM;  Surgeon: [name], MD,FACS;  Location: [facility];  Service: General;  Laterality: N/A;   COLONOSCOPY  02/2018   done for positive cologuard - WNL, rpt  10 yrs Fuller Plan)   CYSTOSCOPY     ERCP  05/22/2012   Procedure: ENDOSCOPIC RETROGRADE CHOLANGIOPANCREATOGRAPHY (ERCP);  Surgeon: [name], MD,FACG;  Location: [facility];  Service: Endoscopy;  Laterality: N/A;   ERCP N/A 09/17/2013   Procedure: ENDOSCOPIC RETROGRADE CHOLANGIOPANCREATOGRAPHY (ERCP);  Surgeon: [name], MD;  Location: [facility];  Service: Endoscopy;  Laterality: N/A;   ERCP N/A 09/27/2018   Procedure: ENDOSCOPIC RETROGRADE CHOLANGIOPANCREATOGRAPHY (ERCP);  Surgeon: [name], MD;  Location: [facility];  Service: Endoscopy;  Laterality: N/A;   ESOPHAGOGASTRODUODENOSCOPY  09/2016   WNL. esophagus dilated [facility] Plan)   HEMORRHOID BANDING  09-23-13   --Dr. Greer Pickerel   HERNIA REPAIR     inguinal   HYSTEROSCOPY W/ ENDOMETRIAL ABLATION     NASAL SINUS SURGERY     x5   PARTIAL KNEE ARTHROPLASTY Right 06/01/2020   Procedure: Right knee patellofemoral replacement;  Surgeon: [name], MD;  Location: [facility];  Service: Orthopedics;  Laterality: Right;   REMOVAL OF STONES  09/27/2018   Procedure: REMOVAL OF STONES;  Surgeon: [name], MD;  Location: [facility];  Service: Endoscopy;;   SPHINCTEROTOMY  09/27/2018   Procedure: SPHINCTEROTOMY;  Surgeon: [name], MD;  Location: [facility];  Service: Endoscopy;;   TOTAL HIP ARTHROPLASTY Left 03/12/2019   Procedure: LEFT TOTAL HIP ARTHROPLASTY ANTERIOR APPROACH;  [name], [name], MD)   UPPER GASTROINTESTINAL ENDOSCOPY     Social History   Social History Narrative   Lives with husband and dog   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   Activity: tries to walk dog (45 min 3x/wk), yoga   Diet: good water, fruits/vegetables daily      Rheum: Naresh Althaus   Psychiatrist: [facility]   Surgery: Redmond Pulling   G[facility]er Plan   Urology: Amalia Hailey   Immunization History  Administered Date(s) Administered   Influenza Split 05/04/2011, 04/04/2012   Influenza Whole 05/25/2007, 04/28/2008, 04/30/2009, 03/26/2010   Influenza,inj,Quad PF,6+ Mos 04/16/2015, 04/15/2016, 05/16/2017, 04/10/2018, 04/19/2019, 04/07/2020   Influenza-Unspecified 03/25/2014   MMR 11/28/2017   Moderna Sars-Covid-2 Vaccination 09/02/2019, 09/30/2019, 04/07/2020   Pneumococcal Conjugate-13 08/02/2013   Pneumococcal Polysaccharide-23 07/30/2009   Td 11/01/2005, 11/10/2016   Zoster Recombinat (Shingrix) 07/21/2017, 11/03/2017     Objective: Vital Signs: BP 132/78 (BP Location: Left Arm, Patient Position: Sitting, Cuff Size: Small)   Pulse 82   Resp 12   Ht 5' 4"  (1.626 m)   Wt 128 lb 6.4 oz (58.2 kg)   LMP 07/25/1998   BMI 22.04 kg/m    Physical Exam Vitals and nursing note reviewed.  Constitutional:      Appearance: She is well-developed.  HENT:     Head: Normocephalic and atraumatic.  Eyes:     Conjunctiva/sclera: Conjunctivae normal.  Cardiovascular:     Rate and Rhythm: Normal rate  and regular rhythm.     Heart sounds: Normal heart sounds.  Pulmonary:     Effort: Pulmonary effort is normal.     Breath sounds: Normal breath sounds.  Abdominal:     General: Bowel sounds are normal.     Palpations: Abdomen is soft.  Musculoskeletal:     Cervical back: Normal range of motion.  Lymphadenopathy:     Cervical: No cervical adenopathy.  Skin:    General: Skin is warm and dry.     Capillary Refill: Capillary refill takes 2 to 3 seconds.     Comments: Sclerodactyly was noted.  Nailbed capillary changes were noted.  Neurological:     Mental Status: She is alert and oriented to person, place, and time.  Psychiatric:        Behavior: Behavior normal.     Musculoskeletal Exam: C-spine was in full range of motion.  Shoulder joints, elbow joints, wrist joints with good range of motion.  She had mild PIP and DIP thickening with no synovitis.  Hip joints with good range of motion.  Right knee joint is replaced and had some warmth on palpation.  Left knee joint was in full range of motion.  She had no tenderness over ankles or MTPs.  CDAI Exam: CDAI Score: -- Patient Global: --; Provider Global: -- Swollen: --; Tender: -- Joint Exam 02/23/2021   No joint exam has been documented for this visit   There is currently no information documented on the homunculus. Go to the Rheumatology activity and complete the homunculus joint exam.  Investigation: No additional findings.  Imaging: No results found.  Recent Labs: Lab Results  Component Value Date   WBC 7.5 10/13/2020   HGB 13.0 10/13/2020   PLT 335.0 10/13/2020   NA 135 10/13/2020   K 4.4 10/13/2020   CL 99 10/13/2020   CO2 31 10/13/2020   GLUCOSE 108 (H) 10/13/2020   BUN 8 10/13/2020   CREATININE 0.59 10/13/2020   BILITOT 0.3 10/13/2020   ALKPHOS 93 10/13/2020   AST 21 10/13/2020   ALT 15 10/13/2020   PROT 7.1 10/13/2020   ALBUMIN 4.7 10/13/2020   CALCIUM 9.6 10/13/2020   GFRAA >60 03/13/2019     Speciality Comments: No specialty comments available.  Procedures:  No procedures performed Allergies: Inh [isoniazid], Cymbalta [duloxetine hcl], Nucynta [tapentadol], Silenor [doxepin hcl], Benzoin, Fentanyl, Celebrex [celecoxib], Codeine phosphate, Lithium, Meperidine hcl, and Sulfamethoxazole   Assessment / Plan:     Visit Diagnoses: 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[place]) - AVISE on 03/26/20: indeterminate index 0.0, ANA positive, no titer, weak positive anti-histone, rest of ENA panel negative, family history of scleroderma, she had sclerodactyly without any progression of the skin tightness.  She continues to have Raynauds symptoms.  I reviewed records from the pulmonary and cardiology visits.  [name] disease without gangrene - amlodipine 7.81m by mouth daily.  Raynauds symptoms are manageable.  Primary osteoarthritis of both hands-joint protection muscle strengthening was discussed.  S/P hip replacement, left-she had good range of motion without discomfort.  Primary osteoarthritis of left knee-she continues to have some knee joint discomfort.  Status post right partial knee replacement - Patellofemoral replacement performed by Dr. DMarlou Saon 06/01/20.  She continues to have discomfort.  And has an appointment coming up with Dr. DMarlou Sa  DDD (degenerative disc disease), cervical-she had good range of motion without discomfort.  DDD (degenerative disc disease), lumbar-she has some lower back pain.  She has been limping which causes increased strain to her back.  Other fatigue -she has been experiencing increased fatigue.  Plan: CBC with Differential/Platelet, COMPLETE METABOLIC PANEL WITH GFR  Fibromyalgia - Lyrica 150 mg 1 capsule by mouth twice daily.  It relieves her symptoms.  Chronic pain syndrome - Lyrica 150 mg 1 capsule twice daily.  CFS (chronic fatigue syndrome)  History of bipolar disorder  Prediabetes  History of gastroesophageal reflux (GERD)  Hx of  porphyria  Family history of scleroderma  Orders: Orders Placed This Encounter  Procedures   CBC with Differential/Platelet   COMPLETE METABOLIC PANEL WITH GFR    No orders of the defined types  were placed in this encounter.    Follow-Up Instructions: Return in about 5 months (around 07/26/2021) for Scleroderma, osteoarthritis.   Bo Merino, MD  Note - This record has been created using Editor, commissioning.  Chart creation errors have been sought, but may not always  have been located. Such creation errors do not reflect on  the standard of medical care.

## 2021-02-11 ENCOUNTER — Ambulatory Visit: Payer: Medicare Other | Admitting: Obstetrics and Gynecology

## 2021-02-23 ENCOUNTER — Encounter: Payer: Self-pay | Admitting: Rheumatology

## 2021-02-23 ENCOUNTER — Ambulatory Visit (INDEPENDENT_AMBULATORY_CARE_PROVIDER_SITE_OTHER): Payer: Medicare Other | Admitting: Rheumatology

## 2021-02-23 ENCOUNTER — Other Ambulatory Visit: Payer: Self-pay

## 2021-02-23 VITALS — BP 132/78 | HR 82 | Resp 12 | Ht 64.0 in | Wt 128.4 lb

## 2021-02-23 DIAGNOSIS — Z9889 Other specified postprocedural states: Secondary | ICD-10-CM

## 2021-02-23 DIAGNOSIS — G894 Chronic pain syndrome: Secondary | ICD-10-CM

## 2021-02-23 DIAGNOSIS — R5382 Chronic fatigue, unspecified: Secondary | ICD-10-CM

## 2021-02-23 DIAGNOSIS — M1712 Unilateral primary osteoarthritis, left knee: Secondary | ICD-10-CM

## 2021-02-23 DIAGNOSIS — G9332 Myalgic encephalomyelitis/chronic fatigue syndrome: Secondary | ICD-10-CM

## 2021-02-23 DIAGNOSIS — Z8269 Family history of other diseases of the musculoskeletal system and connective tissue: Secondary | ICD-10-CM

## 2021-02-23 DIAGNOSIS — Z96642 Presence of left artificial hip joint: Secondary | ICD-10-CM | POA: Diagnosis not present

## 2021-02-23 DIAGNOSIS — R5383 Other fatigue: Secondary | ICD-10-CM

## 2021-02-23 DIAGNOSIS — M19042 Primary osteoarthritis, left hand: Secondary | ICD-10-CM

## 2021-02-23 DIAGNOSIS — M5030 Other cervical disc degeneration, unspecified cervical region: Secondary | ICD-10-CM

## 2021-02-23 DIAGNOSIS — Z8639 Personal history of other endocrine, nutritional and metabolic disease: Secondary | ICD-10-CM

## 2021-02-23 DIAGNOSIS — M19041 Primary osteoarthritis, right hand: Secondary | ICD-10-CM | POA: Diagnosis not present

## 2021-02-23 DIAGNOSIS — M7061 Trochanteric bursitis, right hip: Secondary | ICD-10-CM

## 2021-02-23 DIAGNOSIS — Z96651 Presence of right artificial knee joint: Secondary | ICD-10-CM

## 2021-02-23 DIAGNOSIS — Z8659 Personal history of other mental and behavioral disorders: Secondary | ICD-10-CM

## 2021-02-23 DIAGNOSIS — M797 Fibromyalgia: Secondary | ICD-10-CM

## 2021-02-23 DIAGNOSIS — M349 Systemic sclerosis, unspecified: Secondary | ICD-10-CM | POA: Diagnosis not present

## 2021-02-23 DIAGNOSIS — I7300 Raynaud's syndrome without gangrene: Secondary | ICD-10-CM | POA: Diagnosis not present

## 2021-02-23 DIAGNOSIS — M5136 Other intervertebral disc degeneration, lumbar region: Secondary | ICD-10-CM

## 2021-02-23 DIAGNOSIS — R7303 Prediabetes: Secondary | ICD-10-CM

## 2021-02-23 DIAGNOSIS — Z8719 Personal history of other diseases of the digestive system: Secondary | ICD-10-CM

## 2021-02-24 ENCOUNTER — Encounter: Payer: Self-pay | Admitting: Orthopedic Surgery

## 2021-02-24 ENCOUNTER — Ambulatory Visit (INDEPENDENT_AMBULATORY_CARE_PROVIDER_SITE_OTHER): Payer: Medicare Other | Admitting: Orthopedic Surgery

## 2021-02-24 DIAGNOSIS — M25561 Pain in right knee: Secondary | ICD-10-CM

## 2021-02-24 LAB — COMPLETE METABOLIC PANEL WITH GFR
AG Ratio: 2 (calc) (ref 1.0–2.5)
ALT: 11 U/L (ref 6–29)
AST: 20 U/L (ref 10–35)
Albumin: 4.2 g/dL (ref 3.6–5.1)
Alkaline phosphatase (APISO): 89 U/L (ref 37–153)
BUN: 8 mg/dL (ref 7–25)
CO2: 29 mmol/L (ref 20–32)
Calcium: 9.1 mg/dL (ref 8.6–10.4)
Chloride: 98 mmol/L (ref 98–110)
Creat: 0.65 mg/dL (ref 0.50–1.05)
Globulin: 2.1 g/dL (calc) (ref 1.9–3.7)
Glucose, Bld: 81 mg/dL (ref 65–99)
Potassium: 5 mmol/L (ref 3.5–5.3)
Sodium: 134 mmol/L — ABNORMAL LOW (ref 135–146)
Total Bilirubin: 0.4 mg/dL (ref 0.2–1.2)
Total Protein: 6.3 g/dL (ref 6.1–8.1)
eGFR: 99 mL/min/{1.73_m2} (ref >=60)

## 2021-02-24 LAB — CBC WITH DIFFERENTIAL/PLATELET
Absolute Monocytes: 533 cells/uL (ref 200–950)
Basophils Absolute: 38 cells/uL (ref 0–200)
Basophils Relative: 0.5 %
Eosinophils Absolute: 158 cells/uL (ref 15–500)
Eosinophils Relative: 2.1 %
HCT: 36.7 % (ref 35.0–45.0)
Hemoglobin: 11.7 g/dL (ref 11.7–15.5)
Lymphs Abs: 2235 cells/uL (ref 850–3900)
MCH: 27.9 pg (ref 27.0–33.0)
MCHC: 31.9 g/dL — ABNORMAL LOW (ref 32.0–36.0)
MCV: 87.4 fL (ref 80.0–100.0)
MPV: 9.5 fL (ref 7.5–12.5)
Monocytes Relative: 7.1 %
Neutro Abs: 4538 cells/uL (ref 1500–7800)
Neutrophils Relative %: 60.5 %
Platelets: 266 10*3/uL (ref 140–400)
RBC: 4.2 10*6/uL (ref 3.80–5.10)
RDW: 13 % (ref 11.0–15.0)
Total Lymphocyte: 29.8 %
WBC: 7.5 10*3/uL (ref 3.8–10.8)

## 2021-02-25 LAB — C-REACTIVE PROTEIN: CRP: 0.5 mg/L (ref <8.0)

## 2021-02-25 LAB — SEDIMENTATION RATE: Sed Rate: 2 mm/h (ref 0–30)

## 2021-03-01 ENCOUNTER — Other Ambulatory Visit: Payer: Self-pay | Admitting: Physician Assistant

## 2021-03-01 ENCOUNTER — Other Ambulatory Visit: Payer: Self-pay | Admitting: Family Medicine

## 2021-03-02 NOTE — Telephone Encounter (Signed)
Name of Medication: Adderall XR Name of Pharmacy: [place] or Written Date and Quantity: 12/02/20, #90 Last Office Visit and Type: 01/13/21, 3 mo chronic pain mgmt Next Office Visit and Type: 04/16/21, 3 mo chronic pain mgmt Last Controlled Substance Agreement Date: 04/19/19 Last UDS: 04/19/19

## 2021-03-02 NOTE — Telephone Encounter (Signed)
Next Visit: 07/29/2021  Last Visit: 02/23/2021  Last Fill: 12/02/2020  Dx: [name] disease without gangrene  Current Dose per office note on 02/23/2021: amlodipine 7.'5mg'$  by mouth daily  Okay to refill Amlodipine?

## 2021-03-02 NOTE — Telephone Encounter (Signed)
ERx 

## 2021-03-09 ENCOUNTER — Other Ambulatory Visit: Payer: Self-pay | Admitting: Gastroenterology

## 2021-03-12 ENCOUNTER — Other Ambulatory Visit: Payer: Self-pay | Admitting: Family Medicine

## 2021-03-12 ENCOUNTER — Other Ambulatory Visit: Payer: Self-pay

## 2021-03-12 ENCOUNTER — Ambulatory Visit
Admission: RE | Admit: 2021-03-12 | Discharge: 2021-03-12 | Disposition: A | Discharge status: Home / Self Care | Payer: Medicare Other | Source: Ambulatory Visit | Attending: Surgical | Admitting: Surgical

## 2021-03-12 DIAGNOSIS — M25561 Pain in right knee: Secondary | ICD-10-CM

## 2021-03-12 NOTE — Telephone Encounter (Signed)
Name of Medication: Morphine Name of Pharmacy: [facility] or Written Date and Quantity: 01/13/21, #90 Last Office Visit and Type: 01/13/21, chronic pain mgmt Next Office Visit and Type: 04/16/21, chronic pain mgmt Last Controlled Substance Agreement Date: 04/19/19 Last UDS: 04/19/19

## 2021-03-12 NOTE — Telephone Encounter (Signed)
ERx 

## 2021-03-15 ENCOUNTER — Encounter: Payer: Self-pay | Admitting: Orthopedic Surgery

## 2021-03-15 NOTE — Progress Notes (Signed)
Office Visit Note   Patient: Rachel Duran           Date of Birth: Nov 24, 1955           MRN: [id number] Visit Date: 02/24/2021 Requested by: Ria Bush, MD [place],  [place] [id number] PCP: Ria Bush, MD  Subjective: Chief Complaint  Patient presents with   Right Knee - Follow-up    HPI: Rachel Duran is a 65 y.o. female who presents to the office complaining of right knee pain.  Patient continues to complain of 7/10 pain with aching and throbbing in her knee.  Pain is worse with stairs.  In the morning her pain is okay but by afternoon it is much much worse.  Denies any fevers, chills, night sweats, drainage from the incisions.  Mostly she notices it with walking, driving, ascending/descending stairs.  She has increased pain in her knee with submersion in a pool as well.  She takes IR morphine every day.  Pain has not really improved or worsened over the last 10 weeks.  Localizes most of her pain to the anterior aspect of the knee around the patellar tendon with occasional pain localized to the quad tendon..                ROS: All systems reviewed are negative as they relate to the chief complaint within the history of present illness.  Patient denies fevers or chills.  Assessment & Plan: Visit Diagnoses:  1. Right knee pain, unspecified chronicity     Plan: Patient is a 64 year old female who presents complaining of continued right knee pain.  She is s/p patellofemoral arthroplasty of the right knee.  She has had continued pain with no improvement over the last 10 weeks.  Plan to order ESR and CRP for further evaluation.  These were both found to be within normal limits.  Ordered CT scan of the right knee for further evaluation of compromise of the prosthesis.  Follow-up after CT scan to review results.  Follow-Up Instructions: No follow-ups on file.   Orders:  Orders Placed This Encounter  Procedures   CT KNEE RIGHT WO CONTRAST    Sed Rate (ESR)   C-reactive protein   No orders of the defined types were placed in this encounter.     Procedures: No procedures performed   Clinical Data: No additional findings.  Objective: Vital Signs: LMP 07/25/1998   Physical Exam:  Constitutional: Patient appears well-developed HEENT:  Head: Normocephalic Eyes:EOM are normal Neck: Normal range of motion Cardiovascular: Normal rate Pulmonary/chest: Effort normal Neurologic: Patient is alert Skin: Skin is warm Psychiatric: Patient has normal mood and affect  Ortho Exam: Ortho exam demonstrates right knee with 0 degrees extension and greater than 110 degrees of flexion.  No significant knee effusion noted at time of exam.  Patella is stable with no maltracking.  Tenderness mostly over the patellar tendon with mild tenderness over the medial joint line.  No pain with hip range of motion.  Negative straight leg raise.  No calf tenderness.  Negative Homans' sign.  Specialty Comments:  No specialty comments available.  Imaging: No results found.   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   Chronic patellofemoral  pain of right knee 05/05/2020   Positive ANA (antinuclear antibody) 06/05/2019   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   Unilateral primary osteoarthritis, left hip 01/28/2019   Pelvic pain 12/12/2018   Common bile duct (CBD) obstruction    Venous insufficiency of left lower extremity 07/04/2018   Dyslipidemia 09/22/2017   DDD (degenerative disc disease), cervical 04/03/2017   DDD (degenerative disc disease), lumbar 04/03/2017   Onychomycosis 03/21/2017   Dysphagia 10/18/2016   Encounter for chronic pain management 10/18/2016   Biliary stasis  09/26/2016   Fever blister 08/18/2016   HNP (herniated nucleus pulposus), lumbar 09/23/2015   Sinus congestion 07/30/2015   Iron deficiency 07/30/2015   Health maintenance examination 06/15/2015   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   Headache 03/09/2015   Clavicle enlargement 12/13/2014   Prediabetes 12/13/2014   Plantar fasciitis, bilateral 09/11/2014   Advanced care planning/counseling discussion 06/10/2014   Medicare annual wellness visit, subsequent 06/10/2014   Abnormal thyroid function test 06/10/2014   Chronic pain syndrome 06/10/2014   CFS (chronic fatigue syndrome) 04/08/2014   Postmenopausal atrophic vaginitis 10/19/2012   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   Cervical disc disorder with radiculopathy of cervical region 05/28/2010   CHEST PAIN UNSPECIFIED 02/28/2008   APHTHOUS ULCERS 01/31/2008   Chronic insomnia 11/09/2007   Drug-induced constipation 10/11/2007   Allergic rhinitis 04/12/2007   MDD (major depressive disorder), recurrent episode, moderate ([place]) 03/05/2007   [name] syndrome 03/05/2007   GERD 03/05/2007   ROSACEA 03/05/2007   NEURALGIA 03/05/2007   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 C6- C7 -hx. past fusion(bone graft used)   Cholecystitis    Chronic pain    DDD (degenerative disc disease), lumbar 09/2015   dextroscoliosis with multilevel DDD and facet arthrosis most notable for R foraminal disc protrusion L4/5 producing severe R neural foraminal stenosis abutting R L4 nerve root, moderate spinal canal and mild lat recesss and R neural foraminal stenosis L3/4 (MRI)   Depression    bipolar depression   Disorders of  porphyrin metabolism    Felon of finger of left hand 11/10/2016   Fibromyalgia    GERD (gastroesophageal reflux disease)    Headache    occasionally   Hypertension    Internal hemorrhoids    Interstitial cystitis 06-06-12   hx.   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 (Dr Baird Cancer retinologist)    Family History  Problem Relation Age of Onset   CAD Father 22       MI, nonsmoker   Esophageal cancer Father 85   Stomach cancer Father    Scleroderma Mother    Hypertension Mother  Esophageal cancer Paternal Grandfather    Stomach cancer Paternal Grandfather    Diabetes Maternal Grandmother    Arthritis Brother    Stroke Neg Hx    Colon cancer Neg Hx    Rectal cancer Neg Hx     Past Surgical History:  Procedure Laterality Date   ANTERIOR CERVICAL DECOMP/DISCECTOMY FUSION  2004   C5/6, C6/7   ANTERIOR CERVICAL DECOMP/DISCECTOMY FUSION  02/2016   C3/4, C4/5 with plating [name])   AUGMENTATION MAMMAPLASTY Bilateral 03/25/2010   BREAST ENHANCEMENT SURGERY  2010   BREAST IMPLANT EXCHANGE  10/2014   exchange saline implants, B mastopexy/capsulorraphy ([facility])   BUNIONECTOMY Bilateral yrs ago   [place]   x 1   CHOLECYSTECTOMY  06/11/2012   Procedure: LAPAROSCOPIC CHOLECYSTECTOMY WITH INTRAOPERATIVE CHOLANGIOGRAM;  Surgeon: [name], MD,FACS;  Location: [facility];  Service: General;  Laterality: N/A;   COLONOSCOPY  02/2018   done for positive cologuard - WNL, rpt 10 yrs Fuller Plan)   CYSTOSCOPY     ERCP  05/22/2012   Procedure: ENDOSCOPIC RETROGRADE CHOLANGIOPANCREATOGRAPHY  (ERCP);  Surgeon: [name], MD,FACG;  Location: [facility];  Service: Endoscopy;  Laterality: N/A;   ERCP N/A 09/17/2013   Procedure: ENDOSCOPIC RETROGRADE CHOLANGIOPANCREATOGRAPHY (ERCP);  Surgeon: [name], MD;  Location: [facility];  Service: Endoscopy;  Laterality: N/A;   ERCP N/A 09/27/2018   Procedure: ENDOSCOPIC RETROGRADE CHOLANGIOPANCREATOGRAPHY (ERCP);  Surgeon: [name], MD;  Location: [facility];  Service: Endoscopy;  Laterality: N/A;   ESOPHAGOGASTRODUODENOSCOPY  09/2016   WNL. esophagus dilated Fuller Plan)   HEMORRHOID BANDING  09-23-13   --Dr. Greer Pickerel   HERNIA REPAIR     inguinal   HYSTEROSCOPY W/ ENDOMETRIAL ABLATION     NASAL SINUS SURGERY     x5   PARTIAL KNEE ARTHROPLASTY Right 06/01/2020   Procedure: Right knee patellofemoral replacement;  Surgeon: [name], MD;  Location: [facility];  Service: Orthopedics;  Laterality: Right;   REMOVAL OF STONES  09/27/2018   Procedure: REMOVAL OF STONES;  Surgeon: [name], MD;  [facility];  Service: Endoscopy;;   SPHINCTEROTOMY  09/27/2018   Procedure: SPHINCTEROTOMY;  Surgeon: [name], MD;  [facility];  Service: Endoscopy;;   TOTAL HIP ARTHROPLASTY Left 03/12/2019   Procedure: LEFT TOTAL HIP ARTHROPLASTY ANTERIOR APPROACH;  [name], [name], MD)   UPPER GASTROINTESTINAL ENDOSCOPY     Social History   Occupational History   Occupation: retired    Fish farm manager: UNEMPLOYED  Tobacco Use   Smoking status: Never   Smokeless tobacco: Never  Vaping Use   Vaping Use: Never used  Substance and Sexual Activity   Alcohol use: No    Alcohol/week: 0.0 standard drinks   Drug use: No   Sexual activity: Yes    Partners: Male    Birth control/protection: Post-menopausal    Comment: vasectomy

## 2021-03-17 ENCOUNTER — Encounter: Payer: Self-pay | Admitting: Orthopedic Surgery

## 2021-03-17 ENCOUNTER — Other Ambulatory Visit: Payer: Self-pay

## 2021-03-17 ENCOUNTER — Ambulatory Visit (INDEPENDENT_AMBULATORY_CARE_PROVIDER_SITE_OTHER): Payer: Medicare Other | Admitting: Orthopedic Surgery

## 2021-03-17 DIAGNOSIS — M25561 Pain in right knee: Secondary | ICD-10-CM | POA: Diagnosis not present

## 2021-03-19 ENCOUNTER — Encounter: Payer: Self-pay | Admitting: Orthopedic Surgery

## 2021-03-19 NOTE — Telephone Encounter (Signed)
Done thx

## 2021-03-20 ENCOUNTER — Encounter: Payer: Self-pay | Admitting: Orthopedic Surgery

## 2021-03-20 NOTE — Progress Notes (Signed)
Office Visit Note   Patient: Rachel Duran           Date of Birth: [birth year]           MRN: [id number] Visit Date: 03/17/2021 Requested by: Ria Bush, MD [address] [place],   [id number] PCP: Ria Bush, MD  Subjective: Chief Complaint  Patient presents with   Right Knee - Follow-up    HPI: Rachel Duran is a 65 year old patient who underwent patellofemoral arthroplasty about 9 months ago.  Initially she did very well.  She is has some recurrent pain to a level of 6 out of 10.  Pain is worse at night.  She is on chronic pain management including opioids.  Takes over-the-counter Aleve for her symptoms.  Has not had any falls or injuries.  She did do pretty intense physical therapy after her surgery.  She has been riding stationary bike for about 8 weeks with no improvement.  Hard for her to do the stairs.  Localizes the pain primarily anteriorly.  CT scan has been performed and it shows components to be in good position without hardware failure or complication.  Moderate joint effusion is present.  No acute osseous injury in the knee.              ROS: All systems reviewed are negative as they relate to the chief complaint within the history of present illness.  Patient denies  fevers or chills.   Assessment & Plan: Visit Diagnoses:  1. Right knee pain, unspecified chronicity     Plan: Impression is right knee pain with small spur formation on the inferior aspect of the patella.  This has become slightly more prominent over the course of her recovery.  Whether or not this explains the anterior knee pain she is having is hard to say.  CT scan does not show any bony abnormality.  Stress reaction around the patellar implant could also be a possibility.  Cannot really assess that at this time in her recovery with a bone scan.  Discussed with Rachel Duran the risk and benefits of surgical intervention.  The primary risk would be no improvement in symptoms and also  introduction of infection into an otherwise uninfected joint.  I would favor that arthroscopic approach which would be difficult based on amount of scar tissue in the posterior aspect of the patellar tendon region.  ArthroCare wand to debride that fat pad and bur to debride the spur.  I think that would be her best bet at pain improvement but even then I would give it a 50-50 chance of improvement.  Patient understands the risk and benefits of surgical and nonsurgical intervention.  The implants themselves do not look problematic or loose but that could also be assessed arthroscopically.  Patient understands risk and benefits and at the time of this dictation has talked over with her husband and would like to proceed with arthroscopic intervention to have a chance at improvement.  I think it would take at least 6 to 8 weeks of recovery from that surgery to determine whether or not that would be helpful.  All questions answered  Follow-Up Instructions: No follow-ups on file.   Orders:  No orders of the defined types were placed in this encounter.  No orders of the defined types were placed in this encounter.     Procedures: No procedures performed   Clinical Data: No additional findings.  Objective: Vital Signs: LMP 07/25/1998   Physical Exam:  Constitutional: Patient appears well-developed HEENT:  Head: Normocephalic Eyes:EOM are normal Neck: Normal range of motion Cardiovascular: Normal rate Pulmonary/chest: Effort normal Neurologic: Patient is alert Skin: Skin is warm Psychiatric: Patient has normal mood and affect   Ortho Exam: Ortho exam demonstrates full active and passive range of motion of that right knee.  Trace effusion is present.  Does have some tenderness around the proximal aspect of the patellar tendon.  Collaterals are stable.  Extensor mechanism is intact.  Negative patellar apprehension.  Patella tracks well within the trochlea.  No coarse grinding or crepitus  present within the knee.  Specialty Comments:  No specialty comments available.  Imaging: No results found.   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   Chronic patellofemoral pain of right knee 05/05/2020   Positive ANA (antinuclear antibody) 06/05/2019   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   Unilateral primary osteoarthritis, left hip 01/28/2019   Pelvic pain 12/12/2018   Common bile duct (CBD) obstruction    Venous insufficiency of left lower extremity 07/04/2018   Dyslipidemia 09/22/2017   DDD (degenerative disc disease), cervical 04/03/2017   DDD (degenerative disc disease), lumbar 04/03/2017   Onychomycosis 03/21/2017   Dysphagia 10/18/2016   Encounter for chronic pain management 10/18/2016   Biliary stasis 09/26/2016   Fever blister 08/18/2016   HNP (herniated nucleus pulposus), lumbar 09/23/2015   Sinus congestion 07/30/2015   Iron deficiency 07/30/2015   Health maintenance examination 06/15/2015   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   Headache 03/09/2015   Clavicle enlargement 12/13/2014   Prediabetes 12/13/2014   Plantar fasciitis, bilateral 09/11/2014   Advanced care planning/counseling discussion 06/10/2014   Medicare annual wellness visit, subsequent 06/10/2014   Abnormal thyroid function test 06/10/2014   Chronic pain syndrome 06/10/2014   CFS (chronic fatigue syndrome) 04/08/2014   Postmenopausal atrophic vaginitis 10/19/2012   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 C6- C7 -hx. past fusion(bone graft used)   Cholecystitis    Chronic pain    DDD (degenerative disc disease), lumbar 09/2015   dextroscoliosis with multilevel DDD and facet arthrosis most notable for R foraminal disc protrusion L4/5 producing severe R neural foraminal stenosis abutting R L4 nerve root, moderate spinal canal and mild lat recesss and R neural foraminal stenosis L3/4 (MRI)   Depression    bipolar depression   Disorders of porphyrin metabolism    Felon of finger of left hand 11/10/2016  Fibromyalgia    GERD (gastroesophageal reflux disease)    Headache    occasionally   Hypertension    Internal hemorrhoids    Interstitial cystitis 06-06-12   hx.   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 (Dr Baird Cancer retinologist)    Family History  Problem Relation Age of Onset   CAD Father 6       MI, nonsmoker   Esophageal cancer Father 91   Stomach cancer Father    Scleroderma Mother    Hypertension Mother    Esophageal cancer Paternal Grandfather     Stomach cancer Paternal Grandfather    Diabetes Maternal Grandmother    Arthritis Brother    Stroke Neg Hx    Colon cancer Neg Hx    Rectal cancer Neg Hx     Past Surgical History:  Procedure Laterality Date   ANTERIOR CERVICAL DECOMP/DISCECTOMY FUSION  2004   C5/6, C6/7   ANTERIOR CERVICAL DECOMP/DISCECTOMY FUSION  02/2016   C3/4, C4/5 with plating [name])   AUGMENTATION MAMMAPLASTY Bilateral 03/25/2010   BREAST ENHANCEMENT SURGERY  2010   BREAST IMPLANT EXCHANGE  10/2014   exchange saline implants, B mastopexy/capsulorraphy ([facility])   BUNIONECTOMY Bilateral yrs ago   [place]   x 1   CHOLECYSTECTOMY  06/11/2012   Procedure: LAPAROSCOPIC CHOLECYSTECTOMY WITH INTRAOPERATIVE CHOLANGIOGRAM;  Surgeon: [name], MD,FACS;  [facility];  Service: General;  Laterality: N/A;   COLONOSCOPY  02/2018   done for positive cologuard - WNL, rpt 10 yrs Fuller Plan)   CYSTOSCOPY     ERCP  05/22/2012   Procedure: ENDOSCOPIC RETROGRADE CHOLANGIOPANCREATOGRAPHY (ERCP);  Surgeon: [name], MD,FACG;  Location: [facility];  Service: Endoscopy;  Laterality: N/A;   ERCP N/A 09/17/2013   Procedure: ENDOSCOPIC RETROGRADE CHOLANGIOPANCREATOGRAPHY (ERCP);  Surgeon: [name], MD;  Location: [facility];  Service: Endoscopy;  Laterality: N/A;   ERCP N/A 09/27/2018   Procedure: ENDOSCOPIC RETROGRADE CHOLANGIOPANCREATOGRAPHY (ERCP);  Surgeon: [name], MD;  Location: [facility];  Service: Endoscopy;  Laterality: N/A;   ESOPHAGOGASTRODUODENOSCOPY  09/2016   WNL. esophagus dilated Fuller Plan)   HEMORRHOID BANDING  09-23-13   --Dr. Greer Pickerel   HERNIA REPAIR     inguinal   HYSTEROSCOPY W/ ENDOMETRIAL ABLATION     NASAL SINUS SURGERY     x5   PARTIAL KNEE ARTHROPLASTY Right 06/01/2020   Procedure: Right knee patellofemoral replacement;  Surgeon: [name], MD;  Location: [facility];  Service: Orthopedics;  Laterality: Right;   REMOVAL OF  STONES  09/27/2018   Procedure: REMOVAL OF STONES;  Surgeon: [name], MD;  [facility];  Service: Endoscopy;;   SPHINCTEROTOMY  09/27/2018   Procedure: [name];  Surgeon: [name], MD;  [facility];  Service: Endoscopy;;   TOTAL HIP ARTHROPLASTY Left 03/12/2019   Procedure: LEFT TOTAL HIP ARTHROPLASTY ANTERIOR APPROACH;  [name], [name], MD)   UPPER GASTROINTESTINAL ENDOSCOPY     Social History   Occupational History  Occupation: retired    Fish farm manager: UNEMPLOYED  Tobacco Use   Smoking status: Never   Smokeless tobacco: Never  Vaping Use   Vaping Use: Never used  Substance and Sexual Activity   Alcohol use: No    Alcohol/week: 0.0 standard drinks   Drug use: No   Sexual activity: Yes    Partners: Male    Birth control/protection: Post-menopausal    Comment: vasectomy

## 2021-03-26 ENCOUNTER — Other Ambulatory Visit (HOSPITAL_COMMUNITY): Payer: Self-pay | Admitting: *Deleted

## 2021-03-26 DIAGNOSIS — M349 Systemic sclerosis, unspecified: Secondary | ICD-10-CM

## 2021-03-30 ENCOUNTER — Encounter: Payer: Self-pay | Admitting: Family Medicine

## 2021-03-30 ENCOUNTER — Encounter (HOSPITAL_BASED_OUTPATIENT_CLINIC_OR_DEPARTMENT_OTHER): Payer: Self-pay | Admitting: Orthopedic Surgery

## 2021-03-30 ENCOUNTER — Other Ambulatory Visit: Payer: Self-pay

## 2021-04-06 ENCOUNTER — Ambulatory Visit (HOSPITAL_BASED_OUTPATIENT_CLINIC_OR_DEPARTMENT_OTHER)
Admission: RE | Admit: 2021-04-06 | Discharge: 2021-04-06 | Disposition: A | Discharge status: Home / Self Care | Payer: Medicare Other | Attending: Orthopedic Surgery | Admitting: Orthopedic Surgery

## 2021-04-06 ENCOUNTER — Ambulatory Visit (HOSPITAL_BASED_OUTPATIENT_CLINIC_OR_DEPARTMENT_OTHER): Payer: Medicare Other | Admitting: Anesthesiology

## 2021-04-06 ENCOUNTER — Other Ambulatory Visit: Payer: Self-pay

## 2021-04-06 ENCOUNTER — Encounter (HOSPITAL_BASED_OUTPATIENT_CLINIC_OR_DEPARTMENT_OTHER): Payer: Self-pay | Admitting: Orthopedic Surgery

## 2021-04-06 ENCOUNTER — Encounter (HOSPITAL_BASED_OUTPATIENT_CLINIC_OR_DEPARTMENT_OTHER)
Admission: RE | Disposition: A | Discharge status: Home / Self Care | Payer: Self-pay | Source: Home / Self Care | Attending: Orthopedic Surgery

## 2021-04-06 DIAGNOSIS — M25561 Pain in right knee: Secondary | ICD-10-CM | POA: Diagnosis not present

## 2021-04-06 DIAGNOSIS — Z888 Allergy status to other drugs, medicaments and biological substances status: Secondary | ICD-10-CM | POA: Diagnosis not present

## 2021-04-06 DIAGNOSIS — Z96642 Presence of left artificial hip joint: Secondary | ICD-10-CM | POA: Diagnosis not present

## 2021-04-06 DIAGNOSIS — M659 Synovitis and tenosynovitis, unspecified: Secondary | ICD-10-CM | POA: Diagnosis not present

## 2021-04-06 DIAGNOSIS — Z882 Allergy status to sulfonamides status: Secondary | ICD-10-CM | POA: Insufficient documentation

## 2021-04-06 DIAGNOSIS — Z8719 Personal history of other diseases of the digestive system: Secondary | ICD-10-CM | POA: Diagnosis not present

## 2021-04-06 DIAGNOSIS — Z8261 Family history of arthritis: Secondary | ICD-10-CM | POA: Insufficient documentation

## 2021-04-06 DIAGNOSIS — Z885 Allergy status to narcotic agent status: Secondary | ICD-10-CM | POA: Insufficient documentation

## 2021-04-06 DIAGNOSIS — M1711 Unilateral primary osteoarthritis, right knee: Secondary | ICD-10-CM | POA: Diagnosis not present

## 2021-04-06 DIAGNOSIS — Z886 Allergy status to analgesic agent status: Secondary | ICD-10-CM | POA: Diagnosis not present

## 2021-04-06 DIAGNOSIS — Z7982 Long term (current) use of aspirin: Secondary | ICD-10-CM | POA: Insufficient documentation

## 2021-04-06 DIAGNOSIS — M65861 Other synovitis and tenosynovitis, right lower leg: Secondary | ICD-10-CM | POA: Insufficient documentation

## 2021-04-06 DIAGNOSIS — Z98891 History of uterine scar from previous surgery: Secondary | ICD-10-CM | POA: Insufficient documentation

## 2021-04-06 DIAGNOSIS — Z79899 Other long term (current) drug therapy: Secondary | ICD-10-CM | POA: Diagnosis not present

## 2021-04-06 DIAGNOSIS — Z9049 Acquired absence of other specified parts of digestive tract: Secondary | ICD-10-CM | POA: Insufficient documentation

## 2021-04-06 HISTORY — PX: KNEE ARTHROSCOPY: SHX127

## 2021-04-06 LAB — CBC
HCT: 38.2 % (ref 36.0–46.0)
Hemoglobin: 12.4 g/dL (ref 12.0–15.0)
MCH: 27.9 pg (ref 26.0–34.0)
MCHC: 32.5 g/dL (ref 30.0–36.0)
MCV: 86 fL (ref 80.0–100.0)
Platelets: 298 10*3/uL (ref 150–400)
RBC: 4.44 MIL/uL (ref 3.87–5.11)
RDW: 13.7 % (ref 11.5–15.5)
WBC: 7 10*3/uL (ref 4.0–10.5)
nRBC: 0 % (ref 0.0–0.2)

## 2021-04-06 SURGERY — ARTHROSCOPY, KNEE
Anesthesia: General | Site: Knee | Laterality: Right

## 2021-04-06 MED ORDER — SODIUM CHLORIDE 0.9 % IR SOLN
Status: DC | PRN
  Administered 2021-04-06: 5500 mL

## 2021-04-06 MED ORDER — CLONIDINE HCL (ANALGESIA) 100 MCG/ML EP SOLN
EPIDURAL | Status: DC | PRN
  Administered 2021-04-06: 50 ug via INTRA_ARTICULAR

## 2021-04-06 MED ORDER — SCOPOLAMINE 1 MG/3DAYS TD PT72
MEDICATED_PATCH | TRANSDERMAL | Status: AC
  Filled 2021-04-06: qty 1

## 2021-04-06 MED ORDER — SCOPOLAMINE 1 MG/3DAYS TD PT72
1.0000 | MEDICATED_PATCH | Freq: Once | TRANSDERMAL | Status: DC
  Administered 2021-04-06: 1.5 mg via TRANSDERMAL

## 2021-04-06 MED ORDER — ONDANSETRON HCL 4 MG/2ML IJ SOLN
INTRAMUSCULAR | Status: AC
  Filled 2021-04-06: qty 2

## 2021-04-06 MED ORDER — MIDAZOLAM HCL 2 MG/2ML IJ SOLN
INTRAMUSCULAR | Status: AC
  Filled 2021-04-06: qty 2

## 2021-04-06 MED ORDER — EPHEDRINE SULFATE 50 MG/ML IJ SOLN
INTRAMUSCULAR | Status: DC | PRN
  Administered 2021-04-06: 10 mg via INTRAVENOUS

## 2021-04-06 MED ORDER — CLONIDINE HCL (ANALGESIA) 100 MCG/ML EP SOLN
EPIDURAL | Status: AC
  Filled 2021-04-06: qty 10

## 2021-04-06 MED ORDER — TRANEXAMIC ACID-NACL 1000-0.7 MG/100ML-% IV SOLN
INTRAVENOUS | Status: DC | PRN
  Administered 2021-04-06: 1000 mg via INTRAVENOUS

## 2021-04-06 MED ORDER — ONDANSETRON HCL 4 MG/2ML IJ SOLN
INTRAMUSCULAR | Status: DC | PRN
  Administered 2021-04-06: 4 mg via INTRAVENOUS

## 2021-04-06 MED ORDER — LIDOCAINE HCL (CARDIAC) PF 100 MG/5ML IV SOSY
PREFILLED_SYRINGE | INTRAVENOUS | Status: DC | PRN
  Administered 2021-04-06: 60 mg via INTRAVENOUS

## 2021-04-06 MED ORDER — BUPIVACAINE-EPINEPHRINE 0.25% -1:200000 IJ SOLN
INTRAMUSCULAR | Status: DC | PRN
  Administered 2021-04-06: 20 mL
  Administered 2021-04-06: 10 mL

## 2021-04-06 MED ORDER — CEFAZOLIN SODIUM-DEXTROSE 2-4 GM/100ML-% IV SOLN
2.0000 g | INTRAVENOUS | Status: AC
  Administered 2021-04-06: 2 g via INTRAVENOUS

## 2021-04-06 MED ORDER — MIDAZOLAM HCL 2 MG/2ML IJ SOLN
INTRAMUSCULAR | Status: DC | PRN
  Administered 2021-04-06: 2 mg via INTRAVENOUS

## 2021-04-06 MED ORDER — FENTANYL CITRATE (PF) 100 MCG/2ML IJ SOLN
INTRAMUSCULAR | Status: DC | PRN
  Administered 2021-04-06: 100 ug via INTRAVENOUS

## 2021-04-06 MED ORDER — POVIDONE-IODINE 10 % EX SWAB
2.0000 "application " | Freq: Once | CUTANEOUS | Status: AC
  Administered 2021-04-06: 2 via TOPICAL

## 2021-04-06 MED ORDER — PROPOFOL 500 MG/50ML IV EMUL
INTRAVENOUS | Status: AC
  Filled 2021-04-06: qty 50

## 2021-04-06 MED ORDER — DROPERIDOL 2.5 MG/ML IJ SOLN
INTRAMUSCULAR | Status: AC
  Filled 2021-04-06: qty 2

## 2021-04-06 MED ORDER — DEXAMETHASONE SODIUM PHOSPHATE 10 MG/ML IJ SOLN
INTRAMUSCULAR | Status: DC | PRN
  Administered 2021-04-06: 10 mg via INTRAVENOUS

## 2021-04-06 MED ORDER — MORPHINE SULFATE (PF) 4 MG/ML IV SOLN
INTRAVENOUS | Status: DC | PRN
  Administered 2021-04-06: 6 mg

## 2021-04-06 MED ORDER — OXYCODONE-ACETAMINOPHEN 5-325 MG PO TABS: 1.0000 | ORAL_TABLET | Freq: Four times a day (QID) | ORAL | 0 refills | Status: DC | PRN

## 2021-04-06 MED ORDER — OXYCODONE HCL 5 MG PO TABS
ORAL_TABLET | ORAL | Status: AC
  Filled 2021-04-06: qty 1

## 2021-04-06 MED ORDER — CEFAZOLIN SODIUM-DEXTROSE 2-3 GM-%(50ML) IV SOLR
INTRAVENOUS | Status: DC | PRN
  Administered 2021-04-06: 2 g via INTRAVENOUS

## 2021-04-06 MED ORDER — TRANEXAMIC ACID-NACL 1000-0.7 MG/100ML-% IV SOLN
INTRAVENOUS | Status: AC
  Filled 2021-04-06: qty 100

## 2021-04-06 MED ORDER — KETOROLAC TROMETHAMINE 10 MG PO TABS: 10.0000 mg | ORAL_TABLET | Freq: Three times a day (TID) | ORAL | 0 refills | Status: DC | PRN

## 2021-04-06 MED ORDER — MORPHINE SULFATE (PF) 4 MG/ML IV SOLN
INTRAVENOUS | Status: AC
  Filled 2021-04-06: qty 2

## 2021-04-06 MED ORDER — EPINEPHRINE PF 1 MG/ML IJ SOLN
INTRAMUSCULAR | Status: AC
  Filled 2021-04-06: qty 1

## 2021-04-06 MED ORDER — DROPERIDOL 2.5 MG/ML IJ SOLN
INTRAMUSCULAR | Status: DC | PRN
  Administered 2021-04-06: .625 mg via INTRAVENOUS

## 2021-04-06 MED ORDER — PROMETHAZINE HCL 25 MG/ML IJ SOLN: 6.2500 mg | INTRAMUSCULAR | Status: DC | PRN

## 2021-04-06 MED ORDER — LACTATED RINGERS IV SOLN: INTRAVENOUS | Status: DC

## 2021-04-06 MED ORDER — ACETAMINOPHEN 500 MG PO TABS
ORAL_TABLET | ORAL | Status: AC
  Filled 2021-04-06: qty 2

## 2021-04-06 MED ORDER — FENTANYL CITRATE (PF) 100 MCG/2ML IJ SOLN
25.0000 ug | INTRAMUSCULAR | Status: DC | PRN
  Administered 2021-04-06 (×2): 50 ug via INTRAVENOUS

## 2021-04-06 MED ORDER — OXYCODONE HCL 5 MG PO TABS
5.0000 mg | ORAL_TABLET | Freq: Once | ORAL | Status: AC
  Administered 2021-04-06: 5 mg via ORAL

## 2021-04-06 MED ORDER — FENTANYL CITRATE (PF) 100 MCG/2ML IJ SOLN
INTRAMUSCULAR | Status: AC
  Filled 2021-04-06: qty 2

## 2021-04-06 MED ORDER — POVIDONE-IODINE 7.5 % EX SOLN
Freq: Once | CUTANEOUS | Status: AC
  Filled 2021-04-06: qty 118

## 2021-04-06 MED ORDER — CEFAZOLIN SODIUM-DEXTROSE 2-4 GM/100ML-% IV SOLN
INTRAVENOUS | Status: AC
  Filled 2021-04-06: qty 100

## 2021-04-06 MED ORDER — LACTATED RINGERS IV SOLN: INTRAVENOUS | Status: DC | PRN

## 2021-04-06 MED ORDER — ACETAMINOPHEN 500 MG PO TABS
1000.0000 mg | ORAL_TABLET | Freq: Once | ORAL | Status: AC
  Administered 2021-04-06: 1000 mg via ORAL

## 2021-04-06 MED ORDER — PROPOFOL 10 MG/ML IV BOLUS
INTRAVENOUS | Status: DC | PRN
Start: 2021-04-06 — End: 2021-04-06
  Administered 2021-04-06: 150 mg via INTRAVENOUS

## 2021-04-06 MED ORDER — PROPOFOL 500 MG/50ML IV EMUL
INTRAVENOUS | Status: DC | PRN
  Administered 2021-04-06: 200 ug/kg/min via INTRAVENOUS

## 2021-04-06 MED ORDER — BUPIVACAINE HCL (PF) 0.5 % IJ SOLN
INTRAMUSCULAR | Status: AC
  Filled 2021-04-06: qty 30

## 2021-04-06 MED ORDER — DEXAMETHASONE SODIUM PHOSPHATE 10 MG/ML IJ SOLN
INTRAMUSCULAR | Status: AC
  Filled 2021-04-06: qty 1

## 2021-04-06 SURGICAL SUPPLY — 57 items
BANDAGE ESMARK 6X9 LF (GAUZE/BANDAGES/DRESSINGS) IMPLANT
BLADE EXCALIBUR 4.0MM X 13CM (MISCELLANEOUS)
BLADE EXCALIBUR 4.0X13 (MISCELLANEOUS) IMPLANT
BLADE SURG 15 STRL LF DISP TIS (BLADE) IMPLANT
BLADE SURG 15 STRL SS (BLADE)
BNDG CMPR 9X6 STRL LF SNTH (GAUZE/BANDAGES/DRESSINGS) ×1
BNDG ELASTIC 4X5.8 VLCR STR LF (GAUZE/BANDAGES/DRESSINGS) ×3 IMPLANT
BNDG ELASTIC 6X5.8 VLCR STR LF (GAUZE/BANDAGES/DRESSINGS) ×3 IMPLANT
BNDG ESMARK 6X9 LF (GAUZE/BANDAGES/DRESSINGS) ×3
CUFF TOURN SGL QUICK 34 (TOURNIQUET CUFF)
CUFF TRNQT CYL 34X4.125X (TOURNIQUET CUFF) IMPLANT
DISSECTOR  3.8MM X 13CM (MISCELLANEOUS) ×3
DISSECTOR 3.8MM X 13CM (MISCELLANEOUS) ×1 IMPLANT
DRAPE ARTHROSCOPY W/POUCH 90 (DRAPES) ×3 IMPLANT
DRAPE INCISE IOBAN 66X45 STRL (DRAPES) ×2 IMPLANT
DRAPE U-SHAPE 47X51 STRL (DRAPES) ×4 IMPLANT
DRSG TEGADERM 4X4.75 (GAUZE/BANDAGES/DRESSINGS) ×8 IMPLANT
DURAPREP 26ML APPLICATOR (WOUND CARE) ×5 IMPLANT
DW OUTFLOW CASSETTE/TUBE SET (MISCELLANEOUS) ×3 IMPLANT
ELECT REM PT RETURN 9FT ADLT (ELECTROSURGICAL)
ELECTRODE REM PT RTRN 9FT ADLT (ELECTROSURGICAL) IMPLANT
EXCALIBUR 3.8MM X 13CM (MISCELLANEOUS) IMPLANT
GAUZE 4X4 16PLY ~~LOC~~+RFID DBL (SPONGE) ×2 IMPLANT
GAUZE SPONGE 4X4 12PLY STRL (GAUZE/BANDAGES/DRESSINGS) ×3 IMPLANT
GAUZE XEROFORM 1X8 LF (GAUZE/BANDAGES/DRESSINGS) ×3 IMPLANT
GLOVE SRG 8 PF TXTR STRL LF DI (GLOVE) ×1 IMPLANT
GLOVE SURG ENC MOIS LTX SZ7 (GLOVE) ×3 IMPLANT
GLOVE SURG ENC MOIS LTX SZ8 (GLOVE) ×3 IMPLANT
GLOVE SURG POLYISO LF SZ7 (GLOVE) ×2 IMPLANT
GLOVE SURG UNDER POLY LF SZ7 (GLOVE) ×3 IMPLANT
GLOVE SURG UNDER POLY LF SZ8 (GLOVE) ×3
GOWN STRL REUS W/ TWL LRG LVL3 (GOWN DISPOSABLE) ×2 IMPLANT
GOWN STRL REUS W/ TWL XL LVL3 (GOWN DISPOSABLE) ×1 IMPLANT
GOWN STRL REUS W/TWL LRG LVL3 (GOWN DISPOSABLE) ×9
GOWN STRL REUS W/TWL XL LVL3 (GOWN DISPOSABLE) ×3
HOLDER KNEE FOAM BLUE (MISCELLANEOUS) ×2 IMPLANT
MANIFOLD NEPTUNE II (INSTRUMENTS) ×3 IMPLANT
NDL HYPO 18GX1.5 BLUNT FILL (NEEDLE) ×1 IMPLANT
NDL SAFETY ECLIPSE 18X1.5 (NEEDLE) ×2 IMPLANT
NEEDLE HYPO 18GX1.5 BLUNT FILL (NEEDLE) ×3 IMPLANT
NEEDLE HYPO 18GX1.5 SHARP (NEEDLE)
PACK ARTHROSCOPY DSU (CUSTOM PROCEDURE TRAY) ×3 IMPLANT
PACK BASIN DAY SURGERY FS (CUSTOM PROCEDURE TRAY) ×3 IMPLANT
PADDING CAST COTTON 6X4 STRL (CAST SUPPLIES) ×1 IMPLANT
PENCIL SMOKE EVACUATOR (MISCELLANEOUS) IMPLANT
PORT APPOLLO RF 90DEGREE MULTI (SURGICAL WAND) ×2 IMPLANT
SUT ETHILON 3 0 PS 1 (SUTURE) ×3 IMPLANT
SUT VIC AB 2-0 SH 27 (SUTURE)
SUT VIC AB 2-0 SH 27XBRD (SUTURE) IMPLANT
SUT VIC AB 3-0 SH 27 (SUTURE) ×6
SUT VIC AB 3-0 SH 27X BRD (SUTURE) IMPLANT
SYR 5ML LL (SYRINGE) ×1 IMPLANT
SYR TB 1ML LL NO SAFETY (SYRINGE) ×3 IMPLANT
TOWEL GREEN STERILE FF (TOWEL DISPOSABLE) ×3 IMPLANT
TRAY DSU PREP LF (CUSTOM PROCEDURE TRAY) ×3 IMPLANT
TUBING ARTHROSCOPY IRRIG 16FT (MISCELLANEOUS) ×3 IMPLANT
WRAP KNEE MAXI GEL POST OP (GAUZE/BANDAGES/DRESSINGS) ×3 IMPLANT

## 2021-04-06 NOTE — Transfer of Care (Signed)
Immediate Anesthesia Transfer of Care Note  Patient: Rachel Duran  Procedure(s) Performed: RIGHT KNEE ARTHROSCOPY, DEBRIDEMENT (Right: Knee)  Patient Location: PACU  Anesthesia Type:General  Level of Consciousness: sedated  Airway & Oxygen Therapy: Patient Spontanous Breathing and Patient connected to face mask oxygen  Post-op Assessment: Report given to RN and Post -op Vital signs reviewed and stable  Post vital signs: Reviewed and stable  Last Vitals:  Vitals Value Taken Time  BP 107/66 04/06/21 1539  Temp    Pulse 69 04/06/21 1540  Resp 8 04/06/21 1540  SpO2 100 % 04/06/21 1540  Vitals shown include unvalidated device data.  Last Pain:  Vitals:   04/06/21 1213  TempSrc: Oral  PainSc: 0-No pain      Patients Stated Pain Goal: 7 ([id number])  Complications: No notable events documented.

## 2021-04-06 NOTE — H&P (Signed)
Rachel Duran is an 65 y.o. female.   Chief Complaint: Right knee pain HPI: Rachel Duran is a 65 year old patient who underwent right patellofemoral replacement approximately a year ago.  Has had 2 to 3 months of increasing pain at the inferior pole of the patella.  Nonoperative measures have failed to alleviate her pain.  She does have growth of a small spur inferiorly off the patella.  Presents now for operative management after failure of conservative management and explanation of risks and benefits.  Past Medical History:  Diagnosis Date   Abdominal pain last 4 months   and nausea also   Allergy    Anemia    history of   Anxiety    Bipolar disorder (HCC)    atpical bipolar disorder   Cervical disc disease limited rom turning to left   hx. C6- C7 -hx. past fusion(bone graft used)   Cholecystitis    Chronic pain    DDD (degenerative disc disease), lumbar 09/2015   dextroscoliosis with multilevel DDD and facet arthrosis most notable for R foraminal disc protrusion L4/5 producing severe R neural foraminal stenosis abutting R L4 nerve root, moderate spinal canal and mild lat recesss and R neural foraminal stenosis L3/4 (MRI)   Depression    bipolar depression   Disorders of porphyrin metabolism    Felon of finger of left hand 11/10/2016   Fibromyalgia    GERD (gastroesophageal reflux disease)    Headache    occasionally   Internal hemorrhoids    Interstitial cystitis 06/06/2012   hx.   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 (Dr Baird Cancer retinologist)    Past Surgical History:  Procedure  Laterality Date   ANTERIOR CERVICAL DECOMP/DISCECTOMY FUSION  2004   C5/6, C6/7   ANTERIOR CERVICAL DECOMP/DISCECTOMY FUSION  02/2016   C3/4, C4/5 with plating [name])   AUGMENTATION MAMMAPLASTY Bilateral 03/25/2010   BREAST ENHANCEMENT SURGERY  2010   BREAST IMPLANT EXCHANGE  10/2014   exchange saline implants, B mastopexy/capsulorraphy ([facility])   BUNIONECTOMY Bilateral yrs ago   [place]   x 1   CHOLECYSTECTOMY  06/11/2012   Procedure: LAPAROSCOPIC CHOLECYSTECTOMY WITH INTRAOPERATIVE CHOLANGIOGRAM;  Surgeon: [name], MD,FACS;  Location: [facility];  Service: General;  Laterality: N/A;   COLONOSCOPY  02/2018   done for positive cologuard - WNL, rpt 10 yrs Fuller Plan)   CYSTOSCOPY     ERCP  05/22/2012   Procedure: ENDOSCOPIC RETROGRADE CHOLANGIOPANCREATOGRAPHY (ERCP);  Surgeon: [name], MD,FACG;  Location: [facility];  Service: Endoscopy;  Laterality: N/A;   ERCP N/A 09/17/2013   Procedure: ENDOSCOPIC RETROGRADE CHOLANGIOPANCREATOGRAPHY (ERCP);  Surgeon: [name], MD;  Location: [facility];  Service: Endoscopy;  Laterality: N/A;   ERCP N/A 09/27/2018   Procedure: ENDOSCOPIC RETROGRADE CHOLANGIOPANCREATOGRAPHY (ERCP);  Surgeon: [name], MD;  Location: [facility];  Service: Endoscopy;  Laterality: N/A;   ESOPHAGOGASTRODUODENOSCOPY  09/2016   WNL. esophagus dilated Fuller Plan)   HEMORRHOID BANDING  09-23-13   --Dr. Greer Pickerel   HERNIA REPAIR  inguinal   HYSTEROSCOPY W/ ENDOMETRIAL ABLATION     NASAL SINUS SURGERY     x5   PARTIAL KNEE ARTHROPLASTY Right 06/01/2020   Procedure: Right knee patellofemoral replacement;  Surgeon: [name], MD;  Location: [facility];  Service: Orthopedics;  Laterality: Right;   REMOVAL OF STONES  09/27/2018   Procedure: REMOVAL OF STONES;  Surgeon: [name], MD;  Location: [facility];  Service: Endoscopy;;   SPHINCTEROTOMY  09/27/2018   Procedure: SPHINCTEROTOMY;  Surgeon:  [name], MD;  Location: [facility];  Service: Endoscopy;;   TOTAL HIP ARTHROPLASTY Left 03/12/2019   Procedure: LEFT TOTAL HIP ARTHROPLASTY ANTERIOR APPROACH;  [name], [name], MD)   UPPER GASTROINTESTINAL ENDOSCOPY      Family History  Problem Relation Age of Onset   CAD Father 21       MI, nonsmoker   Esophageal cancer Father 27   Stomach cancer Father    Scleroderma Mother    Hypertension Mother    Esophageal cancer Paternal Grandfather    Stomach cancer Paternal Grandfather    Diabetes Maternal Grandmother    Arthritis Brother    Stroke Neg Hx    Colon cancer Neg Hx    Rectal cancer Neg Hx    Social History:  reports that she has never smoked. She has never used smokeless tobacco. She reports that she does not drink alcohol and does not use drugs.  Allergies:  Allergies  Allergen Reactions   Inh [Isoniazid] Other (See Comments)    Hepatitis   Cymbalta [Duloxetine Hcl] Other (See Comments)    Urinary retention   Nucynta [Tapentadol] Other (See Comments)    Agitation, jerking in legs   Silenor [Doxepin Hcl] Other (See Comments)    nightmare, dizziness, stumbling upon walking, not effective   Benzoin Dermatitis   Fentanyl Other (See Comments)    Cold intolerance, arms twitching, insomnia, fatigue   Celebrex [Celecoxib] Other (See Comments)    Headaches   Codeine Phosphate Nausea And Vomiting and Other (See Comments)    Severe n&v   Lithium Other (See Comments)    Sores in mouth    Meperidine Hcl Rash and Other (See Comments)    REACTION: red skin   Sulfamethoxazole Rash    Medications Prior to Admission  Medication Sig Dispense Refill   ALPRAZolam (XANAX) 1 MG tablet Take 0.5 mg by mouth at bedtime.  0   amLODipine (NORVASC) 2.5 MG tablet TAKE 1 TABLET BY MOUTH DAILY WITH AMLODIPINE 5 MG TABLET. (7.5 MG TOTAL DAILY). 90 tablet 0   amLODipine (NORVASC) 5 MG tablet Take 1 tablet (5 mg total) by mouth daily. 90 tablet 0    amphetamine-dextroamphetamine (ADDERALL XR) 30 MG 24 hr capsule TAKE 1 CAPSULE BY MOUTH EVERY MORNING 90 capsule 0   ARIPiprazole (ABILIFY) 5 MG tablet Take 5 mg by mouth daily.     aspirin 81 MG chewable tablet Chew 1 tablet (81 mg total) by mouth daily. 30 tablet 0   cyanocobalamin (,VITAMIN B-12,) 1000 MCG/ML injection INJECT 1ML INTO THE MUSCLE EVERY 21 DAYSAS DIRECTED 3 mL 3   diclofenac Sodium (VOLTAREN) 1 % GEL APPLY TWO TO FOUR GRAMS TO AFFECTED JOINT UP TO FOUR TIMES A DAY 400 g 2   Eszopiclone 3 MG TABS TAKE 1 TABLET BY MOUTH AT BEDTIME TAKE IMMEDIATLEY BEFORE BEDTIME AS DIRECTED. 90 tablet 0   ferrous sulfate 325 (65 FE) MG tablet Take 325 mg by mouth every Monday,  Wednesday, and Friday.      fluticasone (FLONASE) 50 MCG/ACT nasal spray Place 1 spray into both nostrils daily as needed for allergies. 16 g 5   lamoTRIgine (LAMICTAL) 100 MG tablet Take 100 mg by mouth daily.     LINZESS 290 MCG CAPS capsule TAKE 1 CAPSULE BY MOUTH DAILY 90 capsule 1   morphine (MSIR) 15 MG tablet TAKE ONE TABLET BY MOUTH 3 TIMES DAILY AS NEEDED FOR MODERATE OR SEVERE PAIN 90 tablet 0   MOVANTIK 25 MG TABS tablet Take 25 mg by mouth daily.     Multiple Vitamins-Minerals (PRESERVISION AREDS 2 PO) Take by mouth.     NONFORMULARY OR COMPOUNDED ITEM Testosterone propionate 2% in white petrolatum, apply small amount once daily for 5 days per week. 60 each 0   ondansetron (ZOFRAN) 4 MG tablet TAKE ONE TABLET EVERY EIGHT HOURS AS NEEDED FOR NAUSEA / VOMITING 30 tablet 0   ondansetron (ZOFRAN-ODT) 4 MG disintegrating tablet TAKE 1 TABLET BY MOUTH EVERY 8 HOURS AS NEEDED FOR NAUSEA OR VOMITING 20 tablet 1   pantoprazole (PROTONIX) 40 MG tablet TAKE 1 TABLET BY MOUTH TWICE DAILY WITH A MEAL. 180 tablet 1   pentosan polysulfate (ELMIRON) 100 MG capsule Take 200 mg by mouth 2 (two) times daily.      polyethylene glycol (MIRALAX / GLYCOLAX) 17 g packet Take 17 g by mouth 3 (three) times daily.     Prasterone (INTRAROSA)  6.5 MG INST Place 1 suppository vaginally at bedtime. 84 each 3   pregabalin (LYRICA) 150 MG capsule TAKE 1 CAPSULE BY MOUTH TWICE DAILY 180 capsule 0   PREVIDENT 5000 BOOSTER PLUS 1.1 % PSTE See admin instructions.     senna (SENOKOT) 8.6 MG tablet Take 2 tablets by mouth daily as needed.      UNABLE TO FIND Med Name: Vit D and Calcium powder. Mix in 8 oz of water qd     ursodiol (ACTIGALL) 300 MG capsule TAKE 1 CAPSULE BY MOUTH 2 TIMES DAILY. 180 capsule 3   amoxicillin (AMOXIL) 500 MG tablet Take 4 tablets (2,'000mg'$ ) by mouth 30-60 minutes prior to dental cleaning. 4 tablet 0   phenazopyridine (PYRIDIUM) 100 MG tablet Take by mouth as needed.      tiZANidine (ZANAFLEX) 2 MG tablet Take 1 tablet (2 mg total) by mouth every 8 (eight) hours as needed. 30 tablet 0    Results for orders placed or performed during the hospital encounter of 04/06/21 (from the past 48 hour(s))  CBC     Status: None   Collection Time: 04/06/21 12:03 PM  Result Value Ref Range   WBC 7.0 4.0 - 10.5 K/uL   RBC 4.44 3.87 - 5.11 MIL/uL   Hemoglobin 12.4 12.0 - 15.0 g/dL   HCT 38.2 36.0 - 46.0 %   MCV 86.0 80.0 - 100.0 fL   MCH 27.9 26.0 - 34.0 pg   MCHC 32.5 30.0 - 36.0 g/dL   RDW 13.7 11.5 - 15.5 %   Platelets 298 150 - 400 K/uL   nRBC 0.0 0.0 - 0.2 %    Comment: Performed at [facility], [place] [address], [place], [place] [address]   *Note: Due to a large number of results and/or encounters for the requested time period, some results have not been displayed. A complete set of results can be found in Results Review.   No results found.  Review of Systems  Musculoskeletal:  Positive for arthralgias.  All other  systems reviewed and are negative.  Blood pressure (!) 145/75, pulse (!) 18, temperature 97.8 F (36.6 C), temperature source Oral, resp. rate 18, height '5\' 4"'$  (1.626 m), weight 56.9 kg, last menstrual period 07/25/1998, SpO2 100 %. Physical Exam Vitals reviewed.  HENT:     Head: Normocephalic.      Nose: Nose normal.     Mouth/Throat:     Mouth: Mucous membranes are moist.  Eyes:     Pupils: Pupils are equal, round, and reactive to light.  Cardiovascular:     Rate and Rhythm: Normal rate.     Pulses: Normal pulses.  Pulmonary:     Effort: Pulmonary effort is normal.  Abdominal:     General: Abdomen is flat.  Musculoskeletal:     Cervical back: Normal range of motion.  Skin:    General: Skin is warm.     Capillary Refill: Capillary refill takes less than 2 seconds.  Neurological:     General: No focal deficit present.     Mental Status: She is alert.  Psychiatric:        Mood and Affect: Mood normal.  Examination of the right knee demonstrates no effusion.  Incision well-healed.  Negative patellar apprehension.  Pain at the inferior pole of the patella is present.  Collaterals are stable.  No crepitus or grinding with passive or active range of motion.  Assessment/Plan Impression is inferior pole patellar pain status post PFA.  Plan is arthroscopy with debridement particularly paying attention to that small inferior spur which is developed over the last 3 to 4 months.  Also plan to debride any type of fat pad remnant which is present.  Risk and benefits of the procedure discussed with the patient including not limited to infection nerve vessel damage incomplete pain relief as well as persistent symptoms.  Patient understands and wishes to proceed.  All questions answered  Anderson Malta, MD 04/06/2021, 1:47 PM

## 2021-04-06 NOTE — Brief Op Note (Signed)
   04/06/2021  3:39 PM  PATIENT:  Rachel Duran  65 y.o. female  PRE-OPERATIVE DIAGNOSIS:  right knee inferior pole patella spur  POST-OPERATIVE DIAGNOSIS:  right knee inferior pole spur and early medial compt oa  PROCEDURE:  Procedure(s): RIGHT KNEE ARTHROSCOPY, DEBRIDEMENT  SURGEON:  Surgeon(s): [name], MD  ASSISTANT: magnant pa  ANESTHESIA:   general  EBL: 15 ml    Total I/O In: 1700 [I.V.:1500; IV Piggyback:200] Out: 20 [Blood:20]  BLOOD ADMINISTERED: none  DRAINS: none   LOCAL MEDICATIONS USED:  marcaine mso4 clonidine   SPECIMEN:  No Specimen  COUNTS:  YES  TOURNIQUET:   Total Tourniquet Time Documented: Thigh (Right) - 38 minutes Total: Thigh (Right) - 38 minutes   DICTATION: .Other Dictation: Dictation Number [id number]  PLAN OF CARE: Discharge to home after PACU  PATIENT DISPOSITION:  PACU - hemodynamically stable

## 2021-04-06 NOTE — Discharge Instructions (Signed)
  No tylenol until after 6:45pm today.     Post Anesthesia Home Care Instructions  Activity: Get plenty of rest for the remainder of the day. A responsible individual must stay with you for 24 hours following the procedure.  For the next 24 hours, DO NOT: -Drive a car -Paediatric nurse -Drink alcoholic beverages -Take any medication unless instructed by your physician -Make any legal decisions or sign important papers.  Meals: Start with liquid foods such as gelatin or soup. Progress to regular foods as tolerated. Avoid greasy, spicy, heavy foods. If nausea and/or vomiting occur, drink only clear liquids until the nausea and/or vomiting subsides. Call your physician if vomiting continues.  Special Instructions/Symptoms: Your throat may feel dry or sore from the anesthesia or the breathing tube placed in your throat during surgery. If this causes discomfort, gargle with warm salt water. The discomfort should disappear within 24 hours.  If you had a scopolamine patch placed behind your ear for the management of post- operative nausea and/or vomiting:  1. The medication in the patch is effective for 72 hours, after which it should be removed.  Wrap patch in a tissue and discard in the trash. Wash hands thoroughly with soap and water. 2. You may remove the patch earlier than 72 hours if you experience unpleasant side effects which may include dry mouth, dizziness or visual disturbances. 3. Avoid touching the patch. Wash your hands with soap and water after contact with the patch.

## 2021-04-06 NOTE — Anesthesia Preprocedure Evaluation (Signed)
Anesthesia Evaluation  Patient identified by MRN, date of birth, ID band Patient awake    Reviewed: Allergy & Precautions, NPO status , Patient's Chart, lab work & pertinent test results  History of Anesthesia Complications (+) PONV and history of anesthetic complications  Airway Mallampati: I  TM Distance: >3 FB Neck ROM: Full    Dental  (+) Teeth Intact, Dental Advisory Given   Pulmonary neg pulmonary ROS,    Pulmonary exam normal breath sounds clear to auscultation       Cardiovascular Exercise Tolerance: Good negative cardio ROS Normal cardiovascular exam Rhythm:Regular Rate:Normal     Neuro/Psych  Headaches, PSYCHIATRIC DISORDERS Anxiety Depression Bipolar Disorder  Neuromuscular disease    GI/Hepatic Neg liver ROS, GERD  ,  Endo/Other  negative endocrine ROS  Renal/GU negative Renal ROS     Musculoskeletal  (+) Arthritis , Osteoarthritis,  Fibromyalgia -, narcotic dependent  Abdominal   Peds  Hematology negative hematology ROS (+)   Anesthesia Other Findings Day of surgery medications reviewed with the patient.  Reproductive/Obstetrics                             Anesthesia Physical Anesthesia Plan  ASA: 2  Anesthesia Plan: General   Post-op Pain Management:    Induction: Intravenous  PONV Risk Score and Plan: 4 or greater and Scopolamine patch - Pre-op, Midazolam, Diphenhydramine, TIVA, Dexamethasone and Ondansetron  Airway Management Planned: LMA  Additional Equipment:   Intra-op Plan:   Post-operative Plan: Extubation in OR  Informed Consent: I have reviewed the patients History and Physical, chart, labs and discussed the procedure including the risks, benefits and alternatives for the proposed anesthesia with the patient or authorized representative who has indicated his/her understanding and acceptance.     Dental advisory given  Plan Discussed with:  CRNA  Anesthesia Plan Comments:         Anesthesia Quick Evaluation

## 2021-04-06 NOTE — Anesthesia Procedure Notes (Signed)
Procedure Name: LMA Insertion Date/Time: 04/06/2021 2:12 PM Performed by: Verita Lamb, CRNA Pre-anesthesia Checklist: Patient identified, Emergency Drugs available, Suction available and Patient being monitored Patient Re-evaluated:Patient Re-evaluated prior to induction Oxygen Delivery Method: Circle system utilized Preoxygenation: Pre-oxygenation with 100% oxygen Induction Type: IV induction Ventilation: Mask ventilation without difficulty LMA: LMA inserted LMA Size: 3.0 Number of attempts: 1 Airway Equipment and Method: Bite block Placement Confirmation: positive ETCO2, CO2 detector and breath sounds checked- equal and bilateral Tube secured with: Tape Dental Injury: Teeth and Oropharynx as per pre-operative assessment

## 2021-04-06 NOTE — Anesthesia Postprocedure Evaluation (Signed)
Anesthesia Post Note  Patient: Rachel Duran  Procedure(s) Performed: RIGHT KNEE ARTHROSCOPY WITH DEBRIDEMENT (Right: Knee)     Patient location during evaluation: PACU Anesthesia Type: General Level of consciousness: awake and alert, oriented and awake Pain management: pain level controlled Vital Signs Assessment: post-procedure vital signs reviewed and stable Respiratory status: spontaneous breathing, nonlabored ventilation and respiratory function stable Cardiovascular status: blood pressure returned to baseline and stable Postop Assessment: no apparent nausea or vomiting Anesthetic complications: no   No notable events documented.  Last Vitals:  Vitals:   04/06/21 1630 04/06/21 1649  BP: 118/70 107/76  Pulse: (!) 57 66  Resp: 14 14  Temp:  36.6 C  SpO2: 92% 95%    Last Pain:  Vitals:   04/06/21 1648  TempSrc:   PainSc: 5                  Catalina Gravel

## 2021-04-07 NOTE — Op Note (Signed)
NAME: Rachel Duran, [place] MEDICAL RECORD NO: [id number] ACCOUNT NO: [id number] DATE OF BIRTH: [birth year] FACILITY: [facility] LOCATION: [facility] PHYSICIAN: Yetta Barre. Marlou Sa, MD  Operative Report   DATE OF PROCEDURE: 04/06/2021  PREOPERATIVE DIAGNOSIS:  Right knee inferior pole patellar spur.  POSTOPERATIVE DIAGNOSES:  Right knee synovitis with inferior pole patellar spur and early linear osteoarthritis on the posterior aspect of the medial femoral condyle.  PROCEDURE:  Right knee arthroscopy and debridement.  SURGEON ATTENDING:  [name], MD  ASSISTANT:  Annie Main, PA  INDICATIONS:  The patient is a 65 year old patient who is about almost a year out from right patellofemoral arthroplasty.  She had subacute pain 2 to 3 months ago in the inferior pole of the patella.  Spur noted on radiographs.  Failed conservative  management.  Presents now for operative management after explanation of risks and benefits.  DESCRIPTION OF PROCEDURE:  The patient was brought to the operating room where general anesthetic was induced.  Preoperative antibiotics administered.  Timeout was called.  Right leg prescrubbed with alcohol and Betadine, allowed to air dry, prepped with  DuraPrep solution and draped in a sterile manner.  Ioban used to cover the operative field.  The portals were anesthetized using 5 mL of Marcaine with epinephrine.  Anterior inferolateral and anterior inferomedial portal was then established.   Diagnostic arthroscopy was performed.  The patient had intact lateral compartment articular cartilage and meniscus.  ACL and PCL intact.  The components on the trochlea and patella were stable.  There was significant amount of scar tissue in the  retropatellar region.  This was debrided using a shaver.  No loose bodies in the medial or lateral gutter.  The inferior pole of the patella was visualized after shaving.  Care was taken to avoid injury to the patellar tendon.  Using a shaver, the   inferior pole patellar spur was removed.  Next, Arthrocare wand was used to coagulate any exposed bleeding surfaces. Tourniquet was released.  Bleeding was fairly minimal.  Thorough irrigation was performed in the knee joint.  Chondroplasty and  debridement also performed on that medial femoral condyle where there was a grade III chondromalacia defect measuring about 2 cm long x 7-8 mm wide.  Loose chondral flaps were removed.  No exposed subchondral bone present.  No posterior horn medial  meniscal tear present.  Unusual lesion but it did not correlate to any patellofemoral region.  Thorough irrigation was performed.  Instruments were removed.  Portals were closed using 3-0 Vicryl, 3-0 Nylon.  Solution of Marcaine, morphine, clonidine  injected into the knee for postoperative pain relief.  The patient tolerated the procedure well without immediate complication and transferred to the recovery room in stable condition.  Bulky wrap applied.  Weightbearing as tolerated with knee range of  motion as tolerated, starting tomorrow.  Follow up in 1 week.  Luke's assistance was required for opening, closing, mobilization of the knee.  His assistance was a medical necessity.   SHW D: 04/06/2021 3:49:30 pm T: 04/06/2021 10:32:00 pm  JOB: [id number]

## 2021-04-07 NOTE — Addendum Note (Signed)
Addendum  created 04/07/21 1031 by Maryella Shivers, CRNA   Charge Capture section accepted

## 2021-04-08 ENCOUNTER — Encounter (HOSPITAL_BASED_OUTPATIENT_CLINIC_OR_DEPARTMENT_OTHER): Payer: Self-pay | Admitting: Orthopedic Surgery

## 2021-04-14 ENCOUNTER — Ambulatory Visit (INDEPENDENT_AMBULATORY_CARE_PROVIDER_SITE_OTHER): Payer: Medicare Other | Admitting: Orthopedic Surgery

## 2021-04-14 ENCOUNTER — Other Ambulatory Visit: Payer: Self-pay

## 2021-04-14 ENCOUNTER — Encounter: Payer: Self-pay | Admitting: Orthopedic Surgery

## 2021-04-14 DIAGNOSIS — Z9889 Other specified postprocedural states: Secondary | ICD-10-CM

## 2021-04-14 MED ORDER — CELECOXIB 100 MG PO CAPS: 100.0000 mg | ORAL_CAPSULE | Freq: Two times a day (BID) | ORAL | 0 refills | Status: DC

## 2021-04-16 ENCOUNTER — Encounter: Payer: Self-pay | Admitting: Family Medicine

## 2021-04-16 ENCOUNTER — Telehealth (INDEPENDENT_AMBULATORY_CARE_PROVIDER_SITE_OTHER): Payer: Medicare Other | Admitting: Family Medicine

## 2021-04-16 ENCOUNTER — Telehealth: Payer: Medicare Other | Admitting: Family Medicine

## 2021-04-16 ENCOUNTER — Other Ambulatory Visit: Payer: Self-pay

## 2021-04-16 VITALS — BP 118/78 | Temp 98.6°F | Ht 64.0 in | Wt 127.0 lb

## 2021-04-16 DIAGNOSIS — M797 Fibromyalgia: Secondary | ICD-10-CM | POA: Diagnosis not present

## 2021-04-16 DIAGNOSIS — K5903 Drug induced constipation: Secondary | ICD-10-CM

## 2021-04-16 DIAGNOSIS — G8929 Other chronic pain: Secondary | ICD-10-CM

## 2021-04-16 DIAGNOSIS — G894 Chronic pain syndrome: Secondary | ICD-10-CM

## 2021-04-16 DIAGNOSIS — M25561 Pain in right knee: Secondary | ICD-10-CM

## 2021-04-16 MED ORDER — MORPHINE SULFATE 15 MG PO TABS: 15.0000 mg | ORAL_TABLET | Freq: Three times a day (TID) | ORAL | 0 refills | Status: DC | PRN

## 2021-04-16 NOTE — Assessment & Plan Note (Signed)
S/p recent R knee arthroscopy/debridement, recovering significantly well. Reviewed recent surgery.

## 2021-04-16 NOTE — Assessment & Plan Note (Addendum)
Managing well with linzess, movantik, miralax and PRN senokot/MOM.  Sees GI Fuller Plan)

## 2021-04-16 NOTE — Assessment & Plan Note (Signed)
[place] CSRS reviewed.  Stable period on current regimen - continue this.

## 2021-04-16 NOTE — Progress Notes (Addendum)
Patient ID: Rachel Duran, female    DOB: 1956-07-03, 65 y.o.   MRN: [id number]  Virtual visit completed through [place], a video enabled telemedicine application. Due to national recommendations of social distancing due to COVID-19, a virtual visit is felt to be most appropriate for this patient at this time. Reviewed limitations, risks, security and privacy concerns of performing a virtual visit and the availability of in person appointments. I also reviewed that there may be a patient responsible charge related to this service. The patient agreed to proceed.   Patient location: home Provider location: [place] at [facility], office Persons participating in this virtual visit: patient, provider   If any vitals were documented, they were collected by patient at home unless specified below.    BP 118/78   Temp 98.6 F (37 C)   Ht 5\' 4"  (1.626 m)   Wt 127 lb (57.6 kg)   LMP 07/25/1998   BMI 21.80 kg/m    CC: 3 mo pain management f/u visit  Subjective:   HPI: Rachel Duran is a 65 y.o. female presenting on 04/16/2021 for Pain Management (3 mo f/u)   Recent knee surgery 03/2021 - arthroscopy with debridement of R knee. She was able to manage postop pain with toradol. Has just restarted Celebrex 100mg  bid as well. She had prior partial knee replacement 05/2020.   Longstanding chronic pain managed with opiate - currently on MSIR morpine 15mg  TID PRN #90/month (regularly takes BID, occasionally TID). Tolerates medication well without oversedation, gait unsteadiness. She also takes lyrica 150mg  BID.   Chronic constipation managed with linzess, miralax and movantik - motegrity didn't help, zelnorm not covered by insurance. Also continues PRN senokot and MOM.      Relevant past medical, surgical, family and social history reviewed and updated as indicated. Interim medical history since our last visit reviewed. Allergies and medications reviewed and updated. Outpatient  Medications Prior to Visit  Medication Sig Dispense Refill   ALPRAZolam (XANAX) 1 MG tablet Take 0.5 mg by mouth at bedtime.  0   amLODipine (NORVASC) 2.5 MG tablet TAKE 1 TABLET BY MOUTH DAILY WITH AMLODIPINE 5 MG TABLET. (7.5 MG TOTAL DAILY). 90 tablet 0   amLODipine (NORVASC) 5 MG tablet Take 1 tablet (5 mg total) by mouth daily. 90 tablet 0   amoxicillin (AMOXIL) 500 MG tablet Take 4 tablets (2,000mg ) by mouth 30-60 minutes prior to dental cleaning. 4 tablet 0   amphetamine-dextroamphetamine (ADDERALL XR) 30 MG 24 hr capsule TAKE 1 CAPSULE BY MOUTH EVERY MORNING 90 capsule 0   ARIPiprazole (ABILIFY) 5 MG tablet Take 5 mg by mouth daily.     aspirin 81 MG chewable tablet Chew 1 tablet (81 mg total) by mouth daily. 30 tablet 0   celecoxib (CELEBREX) 100 MG capsule Take 1 capsule (100 mg total) by mouth 2 (two) times daily. 60 capsule 0   cyanocobalamin (,VITAMIN B-12,) 1000 MCG/ML injection INJECT 1ML INTO THE MUSCLE EVERY 21 DAYSAS DIRECTED 3 mL 3   diclofenac Sodium (VOLTAREN) 1 % GEL APPLY TWO TO FOUR GRAMS TO AFFECTED JOINT UP TO FOUR TIMES A DAY 400 g 2   Eszopiclone 3 MG TABS TAKE 1 TABLET BY MOUTH AT BEDTIME TAKE IMMEDIATLEY BEFORE BEDTIME AS DIRECTED. 90 tablet 0   ferrous sulfate 325 (65 FE) MG tablet Take 325 mg by mouth every Monday, Wednesday, and Friday.      fluticasone (FLONASE) 50 MCG/ACT nasal spray Place 1 spray into both nostrils daily  as needed for allergies. 16 g 5   ketorolac (TORADOL) 10 MG tablet Take 1 tablet (10 mg total) by mouth every 8 (eight) hours as needed. 15 tablet 0   lamoTRIgine (LAMICTAL) 100 MG tablet Take 100 mg by mouth daily.     LINZESS 290 MCG CAPS capsule TAKE 1 CAPSULE BY MOUTH DAILY 90 capsule 1   MOVANTIK 25 MG TABS tablet Take 25 mg by mouth daily.     Multiple Vitamins-Minerals (PRESERVISION AREDS 2 PO) Take by mouth.     NONFORMULARY OR COMPOUNDED ITEM Testosterone propionate 2% in white petrolatum, apply small amount once daily for 5 days per  week. 60 each 0   ondansetron (ZOFRAN) 4 MG tablet TAKE ONE TABLET EVERY EIGHT HOURS AS NEEDED FOR NAUSEA / VOMITING 30 tablet 0   ondansetron (ZOFRAN-ODT) 4 MG disintegrating tablet TAKE 1 TABLET BY MOUTH EVERY 8 HOURS AS NEEDED FOR NAUSEA OR VOMITING 20 tablet 1   pantoprazole (PROTONIX) 40 MG tablet TAKE 1 TABLET BY MOUTH TWICE DAILY WITH A MEAL. 180 tablet 1   pentosan polysulfate (ELMIRON) 100 MG capsule Take 200 mg by mouth 2 (two) times daily.      phenazopyridine (PYRIDIUM) 100 MG tablet Take by mouth as needed.      polyethylene glycol (MIRALAX / GLYCOLAX) 17 g packet Take 17 g by mouth 3 (three) times daily.     Prasterone (INTRAROSA) 6.5 MG INST Place 1 suppository vaginally at bedtime. 84 each 3   pregabalin (LYRICA) 150 MG capsule TAKE 1 CAPSULE BY MOUTH TWICE DAILY 180 capsule 0   PREVIDENT 5000 BOOSTER PLUS 1.1 % PSTE See admin instructions.     senna (SENOKOT) 8.6 MG tablet Take 2 tablets by mouth daily as needed.      tiZANidine (ZANAFLEX) 2 MG tablet Take 1 tablet (2 mg total) by mouth every 8 (eight) hours as needed. 30 tablet 0   UNABLE TO FIND Med Name: Vit D and Calcium powder. Mix in 8 oz of water qd     ursodiol (ACTIGALL) 300 MG capsule TAKE 1 CAPSULE BY MOUTH 2 TIMES DAILY. 180 capsule 3   morphine (MSIR) 15 MG tablet TAKE ONE TABLET BY MOUTH 3 TIMES DAILY AS NEEDED FOR MODERATE OR SEVERE PAIN 90 tablet 0   oxyCODONE-acetaminophen (PERCOCET) 5-325 MG tablet Take 1 tablet by mouth every 6 (six) hours as needed for severe pain. Take for breakthrough pain that is not controlled with your morphine. 30 tablet 0   No facility-administered medications prior to visit.     Per HPI unless specifically indicated in ROS section below Review of Systems Objective:  BP 118/78   Temp 98.6 F (37 C)   Ht 5\' 4"  (1.626 m)   Wt 127 lb (57.6 kg)   LMP 07/25/1998   BMI 21.80 kg/m   Wt Readings from Last 3 Encounters:  04/16/21 127 lb (57.6 kg)  04/06/21 125 lb 7.1 oz (56.9 kg)   02/23/21 128 lb 6.4 oz (58.2 kg)       Physical exam: Gen: alert, NAD, not ill appearing Pulm: speaks in complete sentences without increased work of breathing Psych: normal mood, normal thought content      Results for orders placed or performed during the hospital encounter of 04/06/21  CBC  Result Value Ref Range   WBC 7.0 4.0 - 10.5 K/uL   RBC 4.44 3.87 - 5.11 MIL/uL   Hemoglobin 12.4 12.0 - 15.0 g/dL   HCT 38.2 36.0 -  46.0 %   MCV 86.0 80.0 - 100.0 fL   MCH 27.9 26.0 - 34.0 pg   MCHC 32.5 30.0 - 36.0 g/dL   RDW 13.7 11.5 - 15.5 %   Platelets 298 150 - 400 K/uL   nRBC 0.0 0.0 - 0.2 %   *Note: Due to a large number of results and/or encounters for the requested time period, some results have not been displayed. A complete set of results can be found in Results Review.   Assessment & Plan:   Problem List Items Addressed This Visit     Encounter for chronic pain management - Primary (Chronic)    [place] CSRS reviewed.  Stable period on current regimen - continue this.       Drug-induced constipation    Managing well with linzess, movantik, miralax and PRN senokot/MOM.  Sees GI Fuller Plan)      Fibromyalgia   Relevant Medications   morphine (MSIR) 15 MG tablet   Chronic pain syndrome   Relevant Medications   morphine (MSIR) 15 MG tablet   Chronic patellofemoral pain of right knee    S/p recent R knee arthroscopy/debridement, recovering significantly well. Reviewed recent surgery.       Relevant Medications   morphine (MSIR) 15 MG tablet     Meds ordered this encounter  Medications   morphine (MSIR) 15 MG tablet    Sig: Take 1 tablet (15 mg total) by mouth 3 (three) times daily as needed for severe pain or moderate pain.    Dispense:  90 tablet    Refill:  0   No orders of the defined types were placed in this encounter.   I discussed the assessment and treatment plan with the patient. The patient was provided an opportunity to ask questions and all were  answered. The patient agreed with the plan and demonstrated an understanding of the instructions. The patient was advised to call back or seek an in-person evaluation if the symptoms worsen or if the condition fails to improve as anticipated.  Follow up plan: Return in about 3 months (around 07/16/2021) for annual exam, prior fasting for blood work, medicare wellness visit.  Ria Bush, MD

## 2021-04-17 ENCOUNTER — Encounter: Payer: Self-pay | Admitting: Orthopedic Surgery

## 2021-04-17 DIAGNOSIS — M659 Synovitis and tenosynovitis, unspecified: Secondary | ICD-10-CM

## 2021-04-17 NOTE — Progress Notes (Signed)
Post-Op Visit Note   Patient: Rachel Duran           Date of Birth: December 30, 1955           MRN: [id number] Visit Date: 04/14/2021 PCP: Ria Bush, MD   Assessment & Plan:  Chief Complaint:  Chief Complaint  Patient presents with   Right Knee - Routine Post Op   Visit Diagnoses:  1. S/P right knee arthroscopy     Plan: Patient is a 65 year old female who presents s/p right knee arthroscopy with debridement of inferior patellar bone spur on 04/06/2021.  She reports she has not had to take any additional pain medicine aside from her chronic morphine.  She is doing home exercises daily consisting of knee range of motion exercises and 90 leg lifts.  She does complain of 6/10 pain with movement as well as a snapping sensation of the lateral aspect of the knee.  On exam she has incisions that are healing well without any evidence of infection or dehiscence.  She has no significant effusion with just trace swelling.  No calf tenderness.  Negative Homans' sign.  Able to perform straight leg raise easily with no extensor lag.  Sutures removed and replaced with Steri-Strips today.  Suspect that the snapping sensation that is palpable on exam when she goes from flexion to extension at about 30 to 40 degrees is due to some scar tissue and should resolve with more time out from procedure.  Plan to watch this especially at her next appointment.  Avoid aggravating movements in the meantime to bring this on.  Prescribe Celebrex for symptomatic relief.  4-week return.  Follow-Up Instructions: No follow-ups on file.   Orders:  No orders of the defined types were placed in this encounter.  Meds ordered this encounter  Medications   celecoxib (CELEBREX) 100 MG capsule    Sig: Take 1 capsule (100 mg total) by mouth 2 (two) times daily.    Dispense:  60 capsule    Refill:  0    Imaging: No results found.  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   Unilateral primary osteoarthritis, left hip 01/28/2019   Pelvic pain 12/12/2018   Common bile duct (CBD) obstruction    Venous insufficiency of left lower extremity 07/04/2018   Dyslipidemia 09/22/2017   DDD (degenerative disc disease), cervical 04/03/2017   DDD (degenerative disc disease), lumbar 04/03/2017   Onychomycosis 03/21/2017   Dysphagia 10/18/2016   Encounter for chronic pain management 10/18/2016   Biliary stasis 09/26/2016   Fever blister 08/18/2016   HNP (herniated nucleus pulposus), lumbar 09/23/2015   Sinus congestion 07/30/2015   Iron deficiency 07/30/2015   Health maintenance examination 06/15/2015   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   Headache 03/09/2015   Clavicle enlargement 12/13/2014   Prediabetes 12/13/2014   Plantar fasciitis, bilateral 09/11/2014   Advanced care planning/counseling discussion 06/10/2014   Medicare annual wellness visit, subsequent 06/10/2014   Abnormal thyroid function test 06/10/2014   Chronic pain syndrome 06/10/2014   CFS (chronic fatigue syndrome) 04/08/2014   Postmenopausal atrophic vaginitis 10/19/2012   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   Cervical disc disorder with radiculopathy of cervical region 05/28/2010   CHEST PAIN UNSPECIFIED 02/28/2008   APHTHOUS ULCERS 01/31/2008   Chronic insomnia 11/09/2007   Drug-induced constipation 10/11/2007   Allergic rhinitis 04/12/2007   MDD (major depressive disorder), recurrent episode, moderate ([place]) 03/05/2007   [name] syndrome 03/05/2007   GERD 03/05/2007   ROSACEA 03/05/2007   NEURALGIA 03/05/2007   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 C6- C7 -hx. past fusion(bone graft used)   Cholecystitis    Chronic pain    DDD (degenerative disc disease), lumbar 09/2015   dextroscoliosis with multilevel DDD and facet arthrosis most notable for R foraminal disc protrusion L4/5 producing severe R neural foraminal stenosis abutting R L4 nerve root, moderate spinal canal and mild lat recesss and R neural foraminal stenosis L3/4 (MRI)   Depression    bipolar depression   Disorders of porphyrin metabolism    Felon of finger of left hand 11/10/2016   Fibromyalgia    GERD (gastroesophageal reflux disease)    Headache    occasionally   Internal hemorrhoids    Interstitial cystitis 06/06/2012   hx.   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 (Dr Baird Cancer retinologist)    Family History  Problem Relation Age of Onset   CAD Father 48       MI, nonsmoker   Esophageal cancer Father 52   Stomach cancer Father    Scleroderma Mother    Hypertension Mother    Esophageal cancer Paternal Grandfather    Stomach cancer Paternal Grandfather    Diabetes Maternal Grandmother    Arthritis Brother    Stroke Neg Hx    Colon cancer Neg Hx    Rectal cancer Neg Hx     Past Surgical History:  Procedure Laterality Date    ANTERIOR CERVICAL DECOMP/DISCECTOMY FUSION  2004   C5/6, C6/7   ANTERIOR CERVICAL DECOMP/DISCECTOMY FUSION  02/2016   C3/4, C4/5 with plating [name])   AUGMENTATION MAMMAPLASTY Bilateral 03/25/2010   BREAST ENHANCEMENT SURGERY  2010   BREAST IMPLANT EXCHANGE  10/2014   exchange saline implants, B mastopexy/capsulorraphy ([facility])   BUNIONECTOMY Bilateral yrs ago   [place]   x 1   CHOLECYSTECTOMY  06/11/2012   Procedure: LAPAROSCOPIC CHOLECYSTECTOMY WITH INTRAOPERATIVE CHOLANGIOGRAM;  Surgeon: [name], MD,FACS;  Location: [facility];  Service: General;  Laterality: N/A;   COLONOSCOPY  02/2018   done for positive cologuard - WNL, rpt 10 yrs Fuller Plan)   CYSTOSCOPY     ERCP  05/22/2012   Procedure: ENDOSCOPIC RETROGRADE CHOLANGIOPANCREATOGRAPHY (ERCP);  Surgeon: Pricilla Riffle  Fuller Plan, MD,FACG;  Location: [facility];  Service: Endoscopy;  Laterality: N/A;   ERCP N/A 09/17/2013   Procedure: ENDOSCOPIC RETROGRADE CHOLANGIOPANCREATOGRAPHY (ERCP);  Surgeon: [name], MD;  Location: [facility];  Service: Endoscopy;  Laterality: N/A;   ERCP N/A 09/27/2018   Procedure: ENDOSCOPIC RETROGRADE CHOLANGIOPANCREATOGRAPHY (ERCP);  Surgeon: [name], MD;  Location: [facility];  Service: Endoscopy;  Laterality: N/A;   ESOPHAGOGASTRODUODENOSCOPY  09/2016   WNL. esophagus dilated Fuller Plan)   HEMORRHOID BANDING  09-23-13   --Dr. Greer Pickerel   HERNIA REPAIR     inguinal   HYSTEROSCOPY W/ ENDOMETRIAL ABLATION     KNEE ARTHROSCOPY Right 04/06/2021   Procedure: RIGHT KNEE ARTHROSCOPY WITH DEBRIDEMENT;  Surgeon: [name], MD;  Location: [place];  Service: Orthopedics;  Laterality: Right;   NASAL SINUS SURGERY     x5   PARTIAL KNEE ARTHROPLASTY Right 06/01/2020   Procedure: Right knee patellofemoral replacement;  Surgeon: [name], MD;  Location: [place];  Service: Orthopedics;  Laterality: Right;   REMOVAL OF STONES   09/27/2018   Procedure: REMOVAL OF STONES;  Surgeon: [name], MD;  Location: [facility];  Service: Endoscopy;;   SPHINCTEROTOMY  09/27/2018   Procedure: SPHINCTEROTOMY;  Surgeon: [name], MD;  Location: [facility];  Service: Endoscopy;;   TOTAL HIP ARTHROPLASTY Left 03/12/2019   Procedure: LEFT TOTAL HIP ARTHROPLASTY ANTERIOR APPROACH;  [name], [name], MD)   UPPER GASTROINTESTINAL ENDOSCOPY     Social History   Occupational History   Occupation: retired    Fish farm manager: UNEMPLOYED  Tobacco Use   Smoking status: Never   Smokeless tobacco: Never  Vaping Use   Vaping Use: Never used  Substance and Sexual Activity   Alcohol use: No    Alcohol/week: 0.0 standard drinks   Drug use: No   Sexual activity: Yes    Partners: Male    Birth control/protection: Post-menopausal    Comment: vasectomy

## 2021-04-19 ENCOUNTER — Encounter: Payer: Self-pay | Admitting: Orthopedic Surgery

## 2021-04-19 ENCOUNTER — Other Ambulatory Visit: Payer: Self-pay | Admitting: Family Medicine

## 2021-04-19 NOTE — Telephone Encounter (Signed)
ERx 

## 2021-04-27 ENCOUNTER — Other Ambulatory Visit: Payer: Medicare Other

## 2021-04-27 ENCOUNTER — Other Ambulatory Visit: Payer: Self-pay | Admitting: Rheumatology

## 2021-04-28 ENCOUNTER — Other Ambulatory Visit: Payer: Self-pay | Admitting: Rheumatology

## 2021-04-28 NOTE — Progress Notes (Signed)
GYNECOLOGY  VISIT   HPI: 65 y.o.   Married  Caucasian  female   G2P2002 with Patient's last menstrual period was 07/25/1998.   here for breast and pelvic exam.    Using Intrarosa and testosterone treatment.  Wants to continue both. She uses [employer], [employer].   Had right knee surgery with replacement and then removal of bone spurs.  Using a stationary bike at home.   New dx of scleroderma.   Progression of scoliosis.   GYNECOLOGIC HISTORY: Patient's last menstrual period was 07/25/1998. 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/05/2020   Chronic patellofemoral pain of right knee 05/05/2020   Positive ANA (antinuclear antibody) 06/05/2019   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   Unilateral primary osteoarthritis, left hip 01/28/2019   Pelvic pain 12/12/2018   Common bile duct (CBD) obstruction    Venous insufficiency of left lower extremity 07/04/2018   Dyslipidemia 09/22/2017   DDD (degenerative disc disease), cervical 04/03/2017   DDD (degenerative disc disease), lumbar 04/03/2017   Onychomycosis 03/21/2017   Dysphagia 10/18/2016   Encounter for chronic pain management 10/18/2016   Biliary stasis 09/26/2016   Fever blister 08/18/2016   HNP (herniated nucleus pulposus), lumbar 09/23/2015   Sinus congestion 07/30/2015   Iron deficiency 07/30/2015   Health maintenance  examination 06/15/2015   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   Headache 03/09/2015   Clavicle enlargement 12/13/2014   Prediabetes 12/13/2014   Plantar fasciitis, bilateral 09/11/2014   Advanced care planning/counseling discussion 06/10/2014   Medicare annual wellness visit, subsequent 06/10/2014   Abnormal thyroid function test 06/10/2014   Chronic pain syndrome 06/10/2014   CFS (chronic fatigue syndrome) 04/08/2014   Postmenopausal atrophic vaginitis 10/19/2012   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   Cervical disc disorder with radiculopathy of cervical region 05/28/2010   CHEST PAIN UNSPECIFIED 02/28/2008   APHTHOUS ULCERS 01/31/2008   Chronic insomnia 11/09/2007   Drug-induced constipation 10/11/2007   Allergic rhinitis 04/12/2007   MDD (major depressive disorder), recurrent episode, moderate ([place]) 03/05/2007   [name] syndrome 03/05/2007   GERD 03/05/2007   ROSACEA 03/05/2007   NEURALGIA 03/05/2007   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 C6- C7 -hx.  past fusion(bone graft used)   Cholecystitis    Chronic pain    DDD (degenerative disc disease), lumbar 09/2015   dextroscoliosis with multilevel DDD and facet arthrosis most notable for R foraminal disc protrusion L4/5 producing severe R neural foraminal stenosis abutting R L4 nerve root, moderate spinal canal and mild lat recesss and R neural foraminal stenosis L3/4 (MRI)   Depression    bipolar depression   Disorders of porphyrin metabolism    Felon of finger of left hand 11/10/2016   Fibromyalgia    GERD (gastroesophageal reflux disease)    Headache    occasionally   Internal  hemorrhoids    Interstitial cystitis 06/06/2012   hx.   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 (Dr Baird Cancer retinologist)    Past Surgical History:  Procedure Laterality Date   ANTERIOR CERVICAL DECOMP/DISCECTOMY FUSION  2004   C5/6, C6/7   ANTERIOR CERVICAL DECOMP/DISCECTOMY FUSION  02/2016   C3/4, C4/5 with plating [name])   AUGMENTATION MAMMAPLASTY Bilateral 03/25/2010   BREAST ENHANCEMENT SURGERY  2010   BREAST IMPLANT EXCHANGE  10/2014   exchange saline implants, B mastopexy/capsulorraphy [facility])   BUNIONECTOMY Bilateral yrs ago   [place]   x 1   CHOLECYSTECTOMY  06/11/2012   Procedure: LAPAROSCOPIC CHOLECYSTECTOMY WITH INTRAOPERATIVE CHOLANGIOGRAM;  Surgeon: [name], MD,FACS;  Location: [facility];  Service: General;  Laterality: N/A;   COLONOSCOPY  02/2018   done for positive cologuard - WNL, rpt 10 yrs Fuller Plan)   CYSTOSCOPY     ERCP  05/22/2012   Procedure: ENDOSCOPIC RETROGRADE CHOLANGIOPANCREATOGRAPHY (ERCP);  Surgeon: [name], MD,FACG;  Location: [facility];  Service: Endoscopy;  Laterality: N/A;   ERCP N/A 09/17/2013   Procedure: ENDOSCOPIC RETROGRADE CHOLANGIOPANCREATOGRAPHY (ERCP);  Surgeon: [name], MD;  Location: [facility];  Service: Endoscopy;  Laterality: N/A;   ERCP N/A 09/27/2018   Procedure: ENDOSCOPIC RETROGRADE CHOLANGIOPANCREATOGRAPHY (ERCP);  Surgeon: [name], MD;  Location: [facility];  Service: Endoscopy;  Laterality: N/A;   ESOPHAGOGASTRODUODENOSCOPY  09/2016   WNL. esophagus dilated  Fuller Plan)   HEMORRHOID BANDING  09-23-13   --Dr. Greer Pickerel   HERNIA REPAIR     inguinal   HYSTEROSCOPY W/ ENDOMETRIAL ABLATION     KNEE ARTHROSCOPY Right 04/06/2021   Procedure: RIGHT KNEE ARTHROSCOPY WITH DEBRIDEMENT;  Surgeon: [name], MD;  Location: [place];  Service: Orthopedics;  Laterality: Right;   NASAL SINUS SURGERY     x5   PARTIAL KNEE ARTHROPLASTY Right 06/01/2020   Procedure: Right knee patellofemoral replacement;  Surgeon: [name], MD;  Location: [place];  Service: Orthopedics;  Laterality: Right;   REMOVAL OF STONES  09/27/2018   Procedure: REMOVAL OF STONES;  Surgeon: [name], MD;  Location: [facility];  Service: Endoscopy;;   SPHINCTEROTOMY  09/27/2018   Procedure: [name];  Surgeon: [name], MD;  Location: [facility];  Service: Endoscopy;;   TOTAL HIP ARTHROPLASTY Left 03/12/2019  Procedure: LEFT TOTAL HIP ARTHROPLASTY ANTERIOR APPROACH;  [name], [name], MD)   UPPER GASTROINTESTINAL ENDOSCOPY      Current Outpatient Medications  Medication Sig Dispense Refill   ALPRAZolam (XANAX) 1 MG tablet Take 0.5 mg by mouth at bedtime.  0   amLODipine (NORVASC) 2.5 MG tablet TAKE 1 TABLET BY MOUTH DAILY WITH AMLODIPINE 5 MG TABLET. (7.5 MG TOTAL DAILY). 90 tablet 0   amLODipine (NORVASC) 5 MG tablet TAKE 1 AND 1/2 TABLETS BY MOUTH DAILY 45 tablet 2   amoxicillin (AMOXIL) 500 MG tablet Take 4 tablets (2,000mg ) by mouth 30-60 minutes prior to dental cleaning. 4 tablet 0   amphetamine-dextroamphetamine (ADDERALL XR) 30 MG 24 hr capsule TAKE 1 CAPSULE BY MOUTH EVERY MORNING 90 capsule 0   ARIPiprazole (ABILIFY) 5 MG tablet Take 5 mg by mouth daily.     aspirin 81 MG chewable tablet Chew 1 tablet (81 mg total) by mouth daily. 30 tablet 0   cyanocobalamin (,VITAMIN B-12,) 1000 MCG/ML injection INJECT 1ML INTO THE MUSCLE EVERY 21 DAYSAS DIRECTED 3 mL 3   diclofenac Sodium (VOLTAREN) 1 % GEL APPLY  TWO TO FOUR GRAMS TO AFFECTED JOINT UP TO FOUR TIMES A DAY 400 g 2   Eszopiclone 3 MG TABS TAKE 1 TABLET BY MOUTH AT BEDTIME TAKE IMMEDIATLEY BEFORE BEDTIME AS DIRECTED. 90 tablet 0   ferrous sulfate 325 (65 FE) MG tablet Take 325 mg by mouth every Monday, Wednesday, and Friday.      fluticasone (FLONASE) 50 MCG/ACT nasal spray Place 1 spray into both nostrils daily as needed for allergies. 16 g 5   lamoTRIgine (LAMICTAL) 100 MG tablet Take 100 mg by mouth daily.     LINZESS 290 MCG CAPS capsule TAKE 1 CAPSULE BY MOUTH DAILY 90 capsule 1   morphine (MSIR) 15 MG tablet Take 1 tablet (15 mg total) by mouth 3 (three) times daily as needed for severe pain or moderate pain. 90 tablet 0   MOVANTIK 25 MG TABS tablet Take 25 mg by mouth daily.     Multiple Vitamins-Minerals (PRESERVISION AREDS 2 PO) Take by mouth.     NONFORMULARY OR COMPOUNDED ITEM Testosterone propionate 2% in white petrolatum, apply small amount once daily for 5 days per week. 60 each 0   ondansetron (ZOFRAN) 4 MG tablet TAKE ONE TABLET EVERY EIGHT HOURS AS NEEDED FOR NAUSEA / VOMITING 30 tablet 0   ondansetron (ZOFRAN-ODT) 4 MG disintegrating tablet TAKE 1 TABLET BY MOUTH EVERY 8 HOURS AS NEEDED FOR NAUSEA OR VOMITING 20 tablet 1   pantoprazole (PROTONIX) 40 MG tablet TAKE 1 TABLET BY MOUTH TWICE DAILY WITH A MEAL. 180 tablet 1   pentosan polysulfate (ELMIRON) 100 MG capsule Take 200 mg by mouth 2 (two) times daily.      phenazopyridine (PYRIDIUM) 100 MG tablet Take by mouth as needed.      polyethylene glycol (MIRALAX / GLYCOLAX) 17 g packet Take 17 g by mouth 3 (three) times daily.     Prasterone (INTRAROSA) 6.5 MG INST Place 1 suppository vaginally at bedtime. 84 each 3   pregabalin (LYRICA) 150 MG capsule TAKE 1 CAPSULE BY MOUTH TWICE DAILY 180 capsule 0   PREVIDENT 5000 BOOSTER PLUS 1.1 % PSTE See admin instructions.     senna (SENOKOT) 8.6 MG tablet Take 2 tablets by mouth daily as needed.      tiZANidine (ZANAFLEX) 2 MG tablet  Take 1 tablet (2 mg total) by mouth every 8 (eight) hours as  needed. 30 tablet 0   triamcinolone ointment (KENALOG) 0.1 % Apply 1 application topically 2 (two) times daily.     UNABLE TO FIND Med Name: Vit D and Calcium powder. Mix in 8 oz of water qd     ursodiol (ACTIGALL) 300 MG capsule TAKE 1 CAPSULE BY MOUTH 2 TIMES DAILY. 180 capsule 3   No current facility-administered medications for this visit.     ALLERGIES: Inh [isoniazid], Cymbalta [duloxetine hcl], Nucynta [tapentadol], Silenor [doxepin hcl], Benzoin, Fentanyl, Celebrex [celecoxib], Codeine phosphate, Lithium, Meperidine hcl, and Sulfamethoxazole  Family History  Problem Relation Age of Onset   CAD Father 49       MI, nonsmoker   Esophageal cancer Father 28   Stomach cancer Father    Scleroderma Mother    Hypertension Mother    Esophageal cancer Paternal Grandfather    Stomach cancer Paternal Grandfather    Diabetes Maternal Grandmother    Arthritis Brother    Stroke Neg Hx    Colon cancer Neg Hx    Rectal cancer Neg Hx     Social History   Socioeconomic History   Marital status: Married    Spouse name: Not on file   Number of children: Not on file   Years of education: Not on file   Highest education level: Not on file  Occupational History   Occupation: retired    Fish farm manager: UNEMPLOYED  Tobacco Use   Smoking status: Never   Smokeless tobacco: Never  Vaping Use   Vaping Use: Never used  Substance and Sexual Activity   Alcohol use: No    Alcohol/week: 0.0 standard drinks   Drug use: No   Sexual activity: Yes    Partners: Male    Birth control/protection: Post-menopausal    Comment: vasectomy  Other Topics Concern   Not on file  Social History Narrative   Lives with husband and dog   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  PHYSICAL EXAMINATION:    BP 120/70   Pulse 87   Ht 5' 3.5" (1.613 m)   Wt 130 lb (59 kg)   LMP 07/25/1998   SpO2 98%   BMI 22.67 kg/m     General appearance: alert, cooperative and appears stated age Head: Normocephalic, without obvious abnormality, atraumatic Neck: no adenopathy, supple, symmetrical, trachea midline and thyroid normal to inspection and palpation Lungs: clear to auscultation bilaterally Breasts: bilateral implants, no masses or tenderness, No nipple retraction or dimpling, No nipple discharge or bleeding, No axillary or supraclavicular adenopathy Heart: regular rate and rhythm Abdomen: soft, non-tender, no masses,  no organomegaly Extremities: extremities normal, atraumatic, no cyanosis or edema Skin: Skin color, texture, turgor normal. No rashes or lesions Lymph nodes: Cervical, supraclavicular, and axillary nodes normal. No abnormal inguinal nodes palpated Neurologic: Grossly normal  Pelvic: External genitalia:  no lesions  Urethra:  normal appearing urethra with no masses, tenderness or lesions              Bartholins and Skenes: normal                 Vagina: normal appearing vagina with normal color and discharge, atrophy noted.               Cervix: no lesions.  Pap collected.                Bimanual Exam:  Uterus:  normal size, contour, position, consistency, mobility, non-tender              Adnexa: no mass, fullness, tenderness              Rectal exam: yes.  Confirms.              Anus:  normal sphincter tone, no lesions  Chaperone was present for exam:   Estill Bamberg, CMA.  ASSESSMENT  Well woman with GYN exam. Menopause.  Bilateral breast augmentation. Vaginal atrophy.  Decreased libido.  Osteopenia.  Medication monitoring.  Stargardt's disease.  Progressive loss of vision.  Scleroderma.   PLAN  Pap and HR HPV collected.  Yearly mammogram.  Self breat exam encouraged.  Intrarosa refill.  Refill testosterone after testosterone levels are back.  BMD ordered. Continue calcium and vitamin D. FU yearly.    An After Visit Summary was printed and given to the patient.  28 min  total time was spent for this patient encounter, including preparation, face-to-face counseling with the patient, coordination of care, and documentation of the encounter.

## 2021-04-28 NOTE — Telephone Encounter (Signed)
Next Visit: 07/29/2021   Last Visit: 02/23/2021   Dx: [name] disease without gangrene   Current Dose per office note on 02/23/2021: amlodipine 7.5mg  by mouth daily   Okay to refill Amlodipine?

## 2021-04-29 ENCOUNTER — Other Ambulatory Visit: Payer: Self-pay

## 2021-04-29 ENCOUNTER — Ambulatory Visit (INDEPENDENT_AMBULATORY_CARE_PROVIDER_SITE_OTHER): Payer: Medicare Other | Admitting: Obstetrics and Gynecology

## 2021-04-29 ENCOUNTER — Other Ambulatory Visit (HOSPITAL_COMMUNITY)
Admission: RE | Admit: 2021-04-29 | Discharge: 2021-04-29 | Disposition: A | Discharge status: Home / Self Care | Payer: Medicare Other | Source: Ambulatory Visit | Attending: Obstetrics and Gynecology | Admitting: Obstetrics and Gynecology

## 2021-04-29 ENCOUNTER — Encounter: Payer: Self-pay | Admitting: Obstetrics and Gynecology

## 2021-04-29 VITALS — BP 120/70 | HR 87 | Ht 63.5 in | Wt 130.0 lb

## 2021-04-29 DIAGNOSIS — R6882 Decreased libido: Secondary | ICD-10-CM

## 2021-04-29 DIAGNOSIS — Z01419 Encounter for gynecological examination (general) (routine) without abnormal findings: Secondary | ICD-10-CM | POA: Diagnosis not present

## 2021-04-29 DIAGNOSIS — Z780 Asymptomatic menopausal state: Secondary | ICD-10-CM | POA: Diagnosis not present

## 2021-04-29 DIAGNOSIS — Z124 Encounter for screening for malignant neoplasm of cervix: Secondary | ICD-10-CM | POA: Diagnosis not present

## 2021-04-29 DIAGNOSIS — M8580 Other specified disorders of bone density and structure, unspecified site: Secondary | ICD-10-CM | POA: Diagnosis not present

## 2021-04-29 DIAGNOSIS — Z5181 Encounter for therapeutic drug level monitoring: Secondary | ICD-10-CM

## 2021-04-29 DIAGNOSIS — Z1151 Encounter for screening for human papillomavirus (HPV): Secondary | ICD-10-CM | POA: Insufficient documentation

## 2021-04-29 MED ORDER — INTRAROSA 6.5 MG VA INST: 1.0000 | VAGINAL_INSERT | Freq: Every day | VAGINAL | 3 refills | Status: DC

## 2021-04-29 NOTE — Patient Instructions (Signed)

## 2021-05-01 ENCOUNTER — Ambulatory Visit
Admission: RE | Admit: 2021-05-01 | Discharge: 2021-05-01 | Disposition: A | Discharge status: Home / Self Care | Payer: Medicare Other | Source: Ambulatory Visit | Attending: Pulmonary Disease | Admitting: Pulmonary Disease

## 2021-05-01 DIAGNOSIS — R911 Solitary pulmonary nodule: Secondary | ICD-10-CM

## 2021-05-03 ENCOUNTER — Encounter: Payer: Self-pay | Admitting: Obstetrics and Gynecology

## 2021-05-04 ENCOUNTER — Other Ambulatory Visit: Payer: Self-pay | Admitting: Physician Assistant

## 2021-05-04 LAB — CYTOLOGY - PAP
Comment: NEGATIVE
Diagnosis: NEGATIVE
High risk HPV: NEGATIVE

## 2021-05-04 NOTE — Telephone Encounter (Signed)
Next Visit: 07/29/2021  Last Visit: 02/23/2021  Last Fill: 11/10/2020   Okay to refill voltaren gel?

## 2021-05-05 ENCOUNTER — Encounter: Payer: Self-pay | Admitting: Rheumatology

## 2021-05-05 ENCOUNTER — Other Ambulatory Visit: Payer: Self-pay

## 2021-05-05 ENCOUNTER — Encounter: Payer: Self-pay | Admitting: Pulmonary Disease

## 2021-05-05 ENCOUNTER — Ambulatory Visit (INDEPENDENT_AMBULATORY_CARE_PROVIDER_SITE_OTHER): Payer: Medicare Other | Admitting: Pulmonary Disease

## 2021-05-05 ENCOUNTER — Encounter: Payer: Self-pay | Admitting: Orthopedic Surgery

## 2021-05-05 VITALS — BP 140/86 | HR 109 | Temp 98.4°F | Ht 63.5 in | Wt 130.0 lb

## 2021-05-05 DIAGNOSIS — R911 Solitary pulmonary nodule: Secondary | ICD-10-CM

## 2021-05-05 DIAGNOSIS — M349 Systemic sclerosis, unspecified: Secondary | ICD-10-CM | POA: Diagnosis not present

## 2021-05-05 LAB — TESTOS,TOTAL,FREE AND SHBG (FEMALE)
Free Testosterone: 0.5 pg/mL (ref 0.1–6.4)
Sex Hormone Binding: 104 nmol/L — ABNORMAL HIGH (ref 14–73)
Testosterone, Total, LC-MS-MS: 10 ng/dL (ref 2–45)

## 2021-05-05 NOTE — Patient Instructions (Signed)
CT looks good with stable lung nodule and no other lung abnormality Am reassured by the findings Continue to stay active Follow-up in 1 year

## 2021-05-05 NOTE — Progress Notes (Signed)
Rachel Duran    [id number]    March 19, 1956  Primary Care Physician:Gutierrez, Garlon Hatchet, MD  Referring Physician: Ria Bush, MD [address] [place],  [place] [address]  Chief complaint: Consult for ILD evaluation  HPI: 65 year old with history of [name], fibromyalgia with recent diagnosis of scleroderma Referred for evaluation of interstitial lung disease.  She has a strong family history of scleroderma and longstanding [name] phenomena.  More recently she has developed skin thickening over the digits and findings of  capillary dilation and dropout on dermatology evaluation supporting a diagnosis of systemic sclerosis.  She has history of dysphagia and follows with Dr. Fuller Plan, gastroenterology.  Underwent esophageal dilatation around 2018.  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  She has also been referred to Dr. Haroldine Laws for evaluation of pulmonary hypertension She was evaluated by Dr. Haroldine Laws with no evidence of pulmonary hypertension on echocardiogram.  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  No mold, hot tub, Jacuzzi Smoking history: Never smoker Travel history: Originally from [place].  No significant recent travel Relevant family history: Mother had scleroderma with interstitial lung disease and pulmonary hypertension.  Interim history: Continues to do well.  No problems with dyspnea She is here for review of CT scan   Outpatient Encounter Medications as of 05/05/2021  Medication Sig   ALPRAZolam (XANAX) 1 MG tablet Take 0.5 mg by mouth at bedtime.   amLODipine (NORVASC) 2.5 MG tablet TAKE 1 TABLET BY MOUTH DAILY WITH AMLODIPINE 5 MG TABLET. (7.5 MG TOTAL DAILY).   amLODipine (NORVASC) 5 MG tablet TAKE 1 AND 1/2 TABLETS BY MOUTH DAILY   amoxicillin (AMOXIL) 500 MG tablet Take 4 tablets (2,000mg ) by  mouth 30-60 minutes prior to dental cleaning.   amphetamine-dextroamphetamine (ADDERALL XR) 30 MG 24 hr capsule TAKE 1 CAPSULE BY MOUTH EVERY MORNING   ARIPiprazole (ABILIFY) 5 MG tablet Take 5 mg by mouth daily.   aspirin 81 MG chewable tablet Chew 1 tablet (81 mg total) by mouth daily.   cyanocobalamin (,VITAMIN B-12,) 1000 MCG/ML injection INJECT 1ML INTO THE MUSCLE EVERY 21 DAYSAS DIRECTED   diclofenac Sodium (VOLTAREN) 1 % GEL APPLY TWO TO FOUR GRAMS TO AFFECTED JOINT UP TO FOUR TIMES A DAY   Eszopiclone 3 MG TABS TAKE 1 TABLET BY MOUTH AT BEDTIME TAKE IMMEDIATLEY BEFORE BEDTIME AS DIRECTED.   ferrous sulfate 325 (65 FE) MG tablet Take 325 mg by mouth every Monday, Wednesday, and Friday.    fluticasone (FLONASE) 50 MCG/ACT nasal spray Place 1 spray into both nostrils daily as needed for allergies.   lamoTRIgine (LAMICTAL) 100 MG tablet Take 100 mg by mouth daily.   LINZESS 290 MCG CAPS capsule TAKE 1 CAPSULE BY MOUTH DAILY   morphine (MSIR) 15 MG tablet Take 1 tablet (15 mg total) by mouth 3 (three) times daily as needed for severe pain or moderate pain.   MOVANTIK 25 MG TABS tablet Take 25 mg by mouth daily.   Multiple Vitamins-Minerals (PRESERVISION AREDS 2 PO) Take by mouth.   NONFORMULARY OR COMPOUNDED ITEM Testosterone propionate 2% in white petrolatum, apply small amount once daily for 5 days per week.   ondansetron (ZOFRAN) 4 MG tablet TAKE ONE TABLET EVERY EIGHT HOURS AS NEEDED FOR NAUSEA / VOMITING   ondansetron (ZOFRAN-ODT) 4 MG disintegrating tablet TAKE 1 TABLET BY MOUTH  EVERY 8 HOURS AS NEEDED FOR NAUSEA OR VOMITING   pantoprazole (PROTONIX) 40 MG tablet TAKE 1 TABLET BY MOUTH TWICE DAILY WITH A MEAL.   pentosan polysulfate (ELMIRON) 100 MG capsule Take 200 mg by mouth 2 (two) times daily.    phenazopyridine (PYRIDIUM) 100 MG tablet Take by mouth as needed.    polyethylene glycol (MIRALAX / GLYCOLAX) 17 g packet Take 17 g by mouth 3 (three) times daily.   Prasterone (INTRAROSA)  6.5 MG INST Place 1 suppository vaginally at bedtime.   pregabalin (LYRICA) 150 MG capsule TAKE 1 CAPSULE BY MOUTH TWICE DAILY   PREVIDENT 5000 BOOSTER PLUS 1.1 % PSTE See admin instructions.   senna (SENOKOT) 8.6 MG tablet Take 2 tablets by mouth daily as needed.    tiZANidine (ZANAFLEX) 2 MG tablet Take 1 tablet (2 mg total) by mouth every 8 (eight) hours as needed.   triamcinolone ointment (KENALOG) 0.1 % Apply 1 application topically 2 (two) times daily.   UNABLE TO FIND Med Name: Vit D and Calcium powder. Mix in 8 oz of water qd   ursodiol (ACTIGALL) 300 MG capsule TAKE 1 CAPSULE BY MOUTH 2 TIMES DAILY.   No facility-administered encounter medications on file as of 05/05/2021.   Physical Exam: Blood pressure 140/86, pulse (!) 109, temperature 98.4 F (36.9 C), temperature source Oral, height 5' 3.5" (1.613 m), weight 130 lb (59 kg), last menstrual period 07/25/1998, SpO2 97 %. Gen:      No acute distress HEENT:  EOMI, sclera anicteric Neck:     No masses; no thyromegaly Lungs:    Clear to auscultation bilaterally; normal respiratory effort CV:         Regular rate and rhythm; no murmurs Abd:      + bowel sounds; soft, non-tender; no palpable masses, no distension Ext:    No edema; adequate peripheral perfusion Skin:      Warm and dry; no rash Neuro: alert and oriented x 3 Psych: normal mood and affect   Data Reviewed: Imaging: Chest x-ray 10/18/2016-no acute cardiopulmonary abnormality.   High-resolution CT 05/08/2020-no evidence of fibrotic interstitial lung disease, mild tubular bronchiectasis, air trapping, 6 mm nodule in the right pulmonary apex.  Aortic atherosclerosis. CT chest without contrast-no evidence of ILD, pulmonary nodule stable I have reviewed the images personally.  PFTs: 07/07/2020 FVC 3.18 [96%], FEV1 2.63 [103%], F/F 83, TLC 5.20 [101%], DLCO 17.43 [85%] Normal test  Cardiac: Echocardiogram 06/22/2020 LVEF 17-35%, grade 1 diastolic dysfunction, normal RV  systolic size and function.  Assessment:  Consult for interstitial lung disease Limited systemic sclerosis No evidence of interstitial lung disease on CT scan, PFTs are normal She has mild air trapping and bronchiectasis which can be observed without treatment Echo with no pulmonary hypertension Reassured the patient.   Lung nodule Follow-up CT reviewed with stable lung nodule. This is likely benign in a non-smoker.  Does not need annual follow-up  Plan/Recommendations: Return to clinic in 1 year  Marshell Garfinkel MD [facility] 05/05/2021, 2:19 PM  CC: Ria Bush, MD

## 2021-05-06 MED ORDER — "NITROGLYCERIN NICU 2% OINTMENT ": 1.0000 "application " | TOPICAL_OINTMENT | Freq: Three times a day (TID) | TRANSDERMAL | 0 refills | Status: DC

## 2021-05-06 NOTE — Telephone Encounter (Signed)
Ok to provide refill of nitroglycerin ointment.  She should continue on amlodipine 7.5 mg daily.  Please clarify if she has any open sores or pitting on the tips of her fingers.  If her symptoms persist or worsen she should schedule a sooner office visit.    Please advise her to wear gloves, keep her core body temperature warm, and wear socks daily.  She may want to purchase some heated gloves for the winter.

## 2021-05-07 ENCOUNTER — Other Ambulatory Visit: Payer: Self-pay | Admitting: Obstetrics and Gynecology

## 2021-05-07 MED ORDER — NONFORMULARY OR COMPOUNDED ITEM: 0 refills | Status: DC

## 2021-05-10 ENCOUNTER — Telehealth: Payer: Self-pay | Admitting: *Deleted

## 2021-05-10 MED ORDER — NONFORMULARY OR COMPOUNDED ITEM: 0 refills | Status: DC

## 2021-05-10 NOTE — Telephone Encounter (Signed)
Rx printed and was placed in Triage in the refill basket and was not called in. I called Rx into [facility].

## 2021-05-12 ENCOUNTER — Encounter: Payer: Self-pay | Admitting: Orthopedic Surgery

## 2021-05-12 ENCOUNTER — Other Ambulatory Visit: Payer: Self-pay

## 2021-05-12 ENCOUNTER — Ambulatory Visit (INDEPENDENT_AMBULATORY_CARE_PROVIDER_SITE_OTHER): Payer: Medicare Other | Admitting: Orthopedic Surgery

## 2021-05-12 DIAGNOSIS — M2241 Chondromalacia patellae, right knee: Secondary | ICD-10-CM

## 2021-05-12 NOTE — Progress Notes (Signed)
Post-Op Visit Note   Patient: Rachel Duran           Date of Birth: 01/29/1956           MRN: [id number] Visit Date: 05/12/2021 PCP: Ria Bush, MD   Assessment & Plan:  Chief Complaint:  Chief Complaint  Patient presents with   Other     right knee arthroscopy with debridement of inferior patellar bone spur on 04/06/2021   Visit Diagnoses: No diagnosis found.  Plan: Rachel Duran is a 65 year old patient underwent right knee arthroscopy and debridement of inferior pole patella spur 4 weeks ago..  Doing well.  The anterior pain has improved.  Having a little bit of clicking laterally.  On exam no effusion portal sites are well-healed.  No calf tenderness negative Homans.  She does have a little bit of what feels like ITB popping over the lateral tibial plateau region.  No focal joint line tenderness.  She did not take any oxycodone after surgery.  She took Tylenol and Toradol which was sufficient for pain management.  She did have an allergic reaction to Celebrex which was prescribed which lasted for about 10 days but she did get over that.  On exam the rash she had from the bandage has improved significantly.  Plan at this time is to continue strengthening exercises.  Did talk about and showed her how to do some iliotibial band stretching exercises.  Follow-up in 8 weeks for final check.  Follow-Up Instructions: Return in about 8 weeks (around 07/07/2021).   Orders:  No orders of the defined types were placed in this encounter.  No orders of the defined types were placed in this encounter.   Imaging: No results found.  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[place]) 05/05/2020   Chronic patellofemoral pain of right knee 05/05/2020   Positive ANA  (antinuclear antibody) 06/05/2019   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   Unilateral primary osteoarthritis, left hip 01/28/2019   Pelvic pain 12/12/2018   Common bile duct (CBD) obstruction    Venous insufficiency of left lower extremity 07/04/2018   Dyslipidemia 09/22/2017   DDD (degenerative disc disease), cervical 04/03/2017   DDD (degenerative disc disease), lumbar 04/03/2017   Onychomycosis 03/21/2017   Dysphagia 10/18/2016   Encounter for chronic pain management 10/18/2016   Biliary stasis 09/26/2016   Fever blister 08/18/2016   HNP (herniated nucleus pulposus), lumbar 09/23/2015   Sinus congestion 07/30/2015   Iron deficiency 07/30/2015   Health maintenance examination 06/15/2015   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   Headache 03/09/2015   Clavicle enlargement 12/13/2014   Prediabetes 12/13/2014   Plantar fasciitis, bilateral 09/11/2014   Advanced care planning/counseling discussion 06/10/2014   Medicare annual wellness visit, subsequent 06/10/2014   Abnormal thyroid function test 06/10/2014   Chronic pain syndrome 06/10/2014   CFS (chronic fatigue syndrome) 04/08/2014   Postmenopausal atrophic vaginitis 10/19/2012   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   Cervical disc disorder with radiculopathy of cervical region 05/28/2010   CHEST PAIN UNSPECIFIED 02/28/2008   APHTHOUS ULCERS 01/31/2008   Chronic insomnia 11/09/2007   Drug-induced constipation 10/11/2007   Allergic rhinitis 04/12/2007   MDD (major depressive disorder), recurrent episode, moderate ([place]) 03/05/2007   [name] syndrome 03/05/2007  GERD 03/05/2007   ROSACEA 03/05/2007   NEURALGIA 03/05/2007   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 C6- C7 -hx. past fusion(bone graft used)   Cholecystitis    Chronic pain    DDD (degenerative disc disease), lumbar 09/2015   dextroscoliosis with multilevel DDD and facet arthrosis most notable for R foraminal disc protrusion L4/5 producing severe R neural foraminal stenosis abutting R L4 nerve root, moderate spinal canal and mild lat recesss and R neural foraminal stenosis L3/4 (MRI)   Depression    bipolar depression   Disorders of porphyrin metabolism    Felon of finger of left hand 11/10/2016   Fibromyalgia    GERD (gastroesophageal reflux disease)    Headache    occasionally   Internal hemorrhoids    Interstitial cystitis 06/06/2012   hx.   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 (Dr Baird Cancer retinologist)    Family History  Problem Relation Age of Onset   CAD Father 43       MI, nonsmoker   Esophageal cancer Father 64   Stomach cancer Father    Scleroderma Mother    Hypertension Mother    Esophageal cancer Paternal Grandfather    Stomach cancer Paternal Grandfather    Diabetes Maternal Grandmother    Arthritis Brother    Stroke Neg Hx    Colon cancer Neg Hx    Rectal cancer Neg Hx     Past Surgical History:  Procedure Laterality Date   ANTERIOR CERVICAL DECOMP/DISCECTOMY FUSION  2004   C5/6, C6/7   ANTERIOR CERVICAL DECOMP/DISCECTOMY FUSION  02/2016   C3/4, C4/5 with plating [name])   AUGMENTATION MAMMAPLASTY Bilateral 03/25/2010   BREAST ENHANCEMENT SURGERY  2010    BREAST IMPLANT EXCHANGE  10/2014   exchange saline implants, B mastopexy/capsulorraphy ([facility])   BUNIONECTOMY Bilateral yrs ago   [place]   x 1   CHOLECYSTECTOMY  06/11/2012   Procedure: LAPAROSCOPIC CHOLECYSTECTOMY WITH INTRAOPERATIVE CHOLANGIOGRAM;  Surgeon: [name], MD,FACS;  Location: [facility];  Service: General;  Laterality: N/A;   COLONOSCOPY  02/2018   done for positive cologuard - WNL, rpt 10 yrs [place])   CYSTOSCOPY     ERCP  05/22/2012   Procedure: ENDOSCOPIC RETROGRADE CHOLANGIOPANCREATOGRAPHY (ERCP);  Surgeon: [name], MD,FACG;  Location: [facility];  Service: Endoscopy;  Laterality: N/A;   ERCP N/A 09/17/2013   Procedure: ENDOSCOPIC RETROGRADE CHOLANGIOPANCREATOGRAPHY (ERCP);  Surgeon: [name], MD;  Location: [facility];  Service: Endoscopy;  Laterality: N/A;   ERCP N/A 09/27/2018   Procedure: ENDOSCOPIC RETROGRADE CHOLANGIOPANCREATOGRAPHY (ERCP);  Surgeon:  [name], MD;  Location: [facility];  Service: Endoscopy;  Laterality: N/A;   ESOPHAGOGASTRODUODENOSCOPY  09/2016   WNL. esophagus dilated [place])   HEMORRHOID BANDING  09-23-13   --Dr. Greer Pickerel   HERNIA REPAIR     inguinal   HYSTEROSCOPY W/ ENDOMETRIAL ABLATION     KNEE ARTHROSCOPY Right 04/06/2021   Procedure: RIGHT KNEE ARTHROSCOPY WITH DEBRIDEMENT;  Surgeon: [name], MD;  Location: [place];  Service: Orthopedics;  Laterality: Right;   NASAL SINUS SURGERY     x5   PARTIAL KNEE ARTHROPLASTY Right 06/01/2020   Procedure: Right knee patellofemoral replacement;  Surgeon: [name], MD;  Location: [place];  Service: Orthopedics;  Laterality: Right;   REMOVAL OF STONES  09/27/2018   Procedure: REMOVAL OF STONES;  Surgeon: [name], MD;  Location: [facility];  Service: Endoscopy;;   SPHINCTEROTOMY  09/27/2018   Procedure: SPHINCTEROTOMY;  Surgeon: [name], MD;  [facility];  Service:  Endoscopy;;   TOTAL HIP ARTHROPLASTY Left 03/12/2019   Procedure: LEFT TOTAL HIP ARTHROPLASTY ANTERIOR APPROACH;  [name], [name], MD)   UPPER GASTROINTESTINAL ENDOSCOPY     Social History   Occupational History   Occupation: retired    Fish farm manager: UNEMPLOYED  Tobacco Use   Smoking status: Never   Smokeless tobacco: Never  Vaping Use   Vaping Use: Never used  Substance and Sexual Activity   Alcohol use: No    Alcohol/week: 0.0 standard drinks   Drug use: No   Sexual activity: Yes    Partners: Male    Birth control/protection: Post-menopausal    Comment: vasectomy

## 2021-05-18 ENCOUNTER — Encounter: Payer: Self-pay | Admitting: Family Medicine

## 2021-05-19 MED ORDER — LINACLOTIDE 290 MCG PO CAPS: 290.0000 ug | ORAL_CAPSULE | Freq: Every day | ORAL | 1 refills | Status: DC

## 2021-05-19 NOTE — Telephone Encounter (Signed)
Last office visit 04/16/2021 for Pain Management. Last refilled 07/20/2020 for #90 with 1 refill.  CPE scheduled for 07/14/2021.

## 2021-06-02 ENCOUNTER — Other Ambulatory Visit: Payer: Self-pay | Admitting: Family Medicine

## 2021-06-03 NOTE — Telephone Encounter (Signed)
Name of Medication: Adderall XR Name of Pharmacy: [place] or Written Date and Quantity: 03/03/21, #90 Last Office Visit and Type: 04/16/21, chronic pain f/u Next Office Visit and Type: 07/14/21, AWV Last Controlled Substance Agreement Date: 04/19/19 Last UDS: 04/19/19

## 2021-06-04 NOTE — Telephone Encounter (Signed)
ERx 

## 2021-06-07 ENCOUNTER — Other Ambulatory Visit: Payer: Self-pay | Admitting: Physician Assistant

## 2021-06-07 ENCOUNTER — Other Ambulatory Visit: Payer: Self-pay | Admitting: Family Medicine

## 2021-06-07 NOTE — Telephone Encounter (Signed)
Next Visit: 07/29/2021  Last Visit: 02/23/2021  Last Fill: 03/02/2021  Dx:  [name] disease without gangrene  Current Dose per office note on 02/23/2021: amlodipine 7.5mg  by mouth daily.   Okay to refill Amlodipine?

## 2021-06-07 NOTE — Telephone Encounter (Signed)
Refill request Morphine Last refill /23/22 #90 Last office visit 04/16/21 video Upcoming appointment 07/14/21

## 2021-06-07 NOTE — Telephone Encounter (Signed)
eRx

## 2021-06-09 ENCOUNTER — Encounter: Payer: Self-pay | Admitting: Family Medicine

## 2021-06-09 ENCOUNTER — Other Ambulatory Visit: Payer: Self-pay | Admitting: Family Medicine

## 2021-06-09 ENCOUNTER — Other Ambulatory Visit: Payer: Self-pay | Admitting: Physician Assistant

## 2021-06-10 MED ORDER — MOVANTIK 25 MG PO TABS: 25.0000 mg | ORAL_TABLET | Freq: Every day | ORAL | 1 refills | Status: DC

## 2021-06-10 NOTE — Telephone Encounter (Signed)
Duplicate request

## 2021-06-16 ENCOUNTER — Other Ambulatory Visit: Payer: Self-pay | Admitting: Physician Assistant

## 2021-06-16 NOTE — Telephone Encounter (Signed)
Next Visit: 07/29/2021  Last Visit: 02/23/2021  Last Fill: 10/19/2020  Dx: Fibromyalgia  Current Dose per office note on 02/23/2021: Lyrica 150 mg 1 capsule by mouth twice daily  Okay to refill Lyrica?

## 2021-06-22 ENCOUNTER — Encounter: Payer: Self-pay | Admitting: Family Medicine

## 2021-06-23 ENCOUNTER — Encounter (HOSPITAL_COMMUNITY): Payer: Self-pay | Admitting: Internal Medicine

## 2021-06-23 ENCOUNTER — Ambulatory Visit (HOSPITAL_COMMUNITY)
Admission: RE | Admit: 2021-06-23 | Discharge: 2021-06-23 | Disposition: A | Discharge status: Home / Self Care | Payer: Medicare Other | Source: Ambulatory Visit | Attending: Family Medicine | Admitting: Family Medicine

## 2021-06-23 ENCOUNTER — Ambulatory Visit (HOSPITAL_BASED_OUTPATIENT_CLINIC_OR_DEPARTMENT_OTHER)
Admission: RE | Admit: 2021-06-23 | Discharge: 2021-06-23 | Disposition: A | Discharge status: Home / Self Care | Payer: Medicare Other | Source: Ambulatory Visit | Attending: Internal Medicine | Admitting: Internal Medicine

## 2021-06-23 VITALS — BP 124/80 | HR 88 | Wt 128.2 lb

## 2021-06-23 DIAGNOSIS — M349 Systemic sclerosis, unspecified: Secondary | ICD-10-CM

## 2021-06-23 DIAGNOSIS — I272 Pulmonary hypertension, unspecified: Secondary | ICD-10-CM | POA: Diagnosis not present

## 2021-06-23 DIAGNOSIS — I700 Atherosclerosis of aorta: Secondary | ICD-10-CM | POA: Diagnosis not present

## 2021-06-23 DIAGNOSIS — I2510 Atherosclerotic heart disease of native coronary artery without angina pectoris: Secondary | ICD-10-CM | POA: Diagnosis not present

## 2021-06-23 DIAGNOSIS — I10 Essential (primary) hypertension: Secondary | ICD-10-CM

## 2021-06-23 DIAGNOSIS — M3489 Other systemic sclerosis: Secondary | ICD-10-CM | POA: Diagnosis not present

## 2021-06-23 DIAGNOSIS — M797 Fibromyalgia: Secondary | ICD-10-CM | POA: Insufficient documentation

## 2021-06-23 DIAGNOSIS — I110 Hypertensive heart disease with heart failure: Secondary | ICD-10-CM | POA: Insufficient documentation

## 2021-06-23 DIAGNOSIS — I509 Heart failure, unspecified: Secondary | ICD-10-CM | POA: Insufficient documentation

## 2021-06-23 DIAGNOSIS — E785 Hyperlipidemia, unspecified: Secondary | ICD-10-CM | POA: Diagnosis not present

## 2021-06-23 DIAGNOSIS — F319 Bipolar disorder, unspecified: Secondary | ICD-10-CM | POA: Diagnosis not present

## 2021-06-23 DIAGNOSIS — Z7982 Long term (current) use of aspirin: Secondary | ICD-10-CM | POA: Insufficient documentation

## 2021-06-23 LAB — ECHOCARDIOGRAM COMPLETE
AR max vel: 2.94 cm2
AV Peak grad: 4.7 mmHg
Ao pk vel: 1.08 m/s
Area-P 1/2: 3.93 cm2
Calc EF: 57.3 %
S' Lateral: 2.7 cm
Single Plane A2C EF: 58.4 %
Single Plane A4C EF: 55.7 %

## 2021-06-23 NOTE — Telephone Encounter (Signed)
Ok to do virtual visit with labs prior at [facility] - however if we do this will need to change from physical to just routine f/u visit. Alternatively we can postpone physical by 1 month and hopefully be back at [facility] in Jan.  Plz notify patient.

## 2021-06-23 NOTE — Progress Notes (Signed)
ADVANCED HF CLINIC NOTE  Referring Physician:  Primary Care: Ria Bush, MD    HPI:  Rachel Duran is a 66 y/o woman with bipolar d/o, HTN, fibromyalgia, systemic sclerosis referred by Dr. Estanislado Pandy for pulmonary HTN surveillance in setting of CTD.   Has a complicated medical history but denies any h/o known cardiac disease.   Had echo in 7/14 for LE edema which was normal. Had hi-res CT with Dr.Mannam on 05/08/20. No ILD. Evidence of air trapping suggestive of small airway disease. 98mm pulmonary nodule.   Underwent right knee patellofemoral replacement on 06/01/2020  Here for annual screening for PAH with scleroderma. Active. Denies SOB, LE edema or dizziness.   Follows with Dr. Vaughan Browner pending repeat PFTs soon. Chest CT 05/01/21 stable nodule. No ILD. + coronary artery calcium   Echo today 06/23/21 EF 55% RV normal. No evidence PAH or RV strain Personally reviewed   PFTs 12/21 FEV1 2.59 (101%) FVC 3.23 (97%) Ratio 80% DLCO 85%  Past Medical History:  Diagnosis Date   Abdominal pain last 4 months   and nausea also   Allergy    Anemia    history of   Anxiety    Bipolar disorder ([facility])    atpical bipolar disorder   Cervical disc disease limited rom turning to left   hx. C6- C7 -hx. past fusion(bone graft used)   Cholecystitis    Chronic pain    DDD (degenerative disc disease), lumbar 09/2015   dextroscoliosis with multilevel DDD and facet arthrosis most notable for R foraminal disc protrusion L4/5 producing severe R neural foraminal stenosis abutting R L4 nerve root, moderate spinal canal and mild lat recesss and R neural foraminal stenosis L3/4 (MRI)   Depression    bipolar depression   Disorders of porphyrin metabolism    Felon of finger of left hand 11/10/2016   Fibromyalgia    GERD (gastroesophageal reflux disease)    Headache    occasionally   Internal hemorrhoids    Interstitial cystitis 06/06/2012   hx.   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   Scleroderma ([place]) 04/24/2020   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 (Dr Baird Cancer retinologist)    Current Outpatient Medications  Medication Sig Dispense Refill   ALPRAZolam (XANAX) 1 MG tablet Take 0.5 mg by mouth at bedtime.  0   amLODipine (NORVASC) 2.5 MG tablet TAKE 1 TABLET BY MOUTH DAILY WITH AMLODIPINE 5 MG TABLET. (7.5 MG TOTAL DAILY. 90 tablet 0   amLODipine (NORVASC) 5 MG tablet Take 5 mg by mouth daily.     amphetamine-dextroamphetamine (ADDERALL XR) 30 MG 24 hr capsule TAKE 1 CAPSULE BY MOUTH EACH MORNING 90 capsule 0   ARIPiprazole (ABILIFY) 5 MG tablet Take 5 mg by mouth daily.     aspirin 81 MG chewable tablet Chew 1 tablet (81 mg total) by mouth daily. 30 tablet 0   cyanocobalamin (,VITAMIN B-12,) 1000 MCG/ML injection INJECT 1ML INTO THE MUSCLE EVERY 21 DAYSAS DIRECTED 3 mL 3   diclofenac Sodium (VOLTAREN) 1 % GEL APPLY TWO TO FOUR GRAMS TO  AFFECTED JOINT UP TO FOUR TIMES A DAY 400 g 2   Eszopiclone 3 MG TABS TAKE 1 TABLET BY MOUTH AT BEDTIME TAKE IMMEDIATLEY BEFORE BEDTIME AS DIRECTED. 90 tablet 0   ferrous sulfate 325 (65 FE) MG tablet Take 325 mg by mouth every Monday, Wednesday, and Friday.      fluticasone (FLONASE) 50 MCG/ACT nasal spray Place 1 spray into both nostrils daily as needed for allergies. 16 g 5   lamoTRIgine (LAMICTAL) 100 MG tablet Take 100 mg by mouth daily.     linaclotide (LINZESS) 290 MCG CAPS capsule Take 1 capsule (290 mcg total) by mouth daily. 90 capsule 1   morphine (MSIR) 15 MG tablet TAKE 1 TABLET BY MOUTH 3 TIMES DAILY AS NEEDED FOR SEVERE PAIN OR MODERATE PAIN. 90 tablet 0   MOVANTIK 25 MG TABS tablet Take 1  tablet (25 mg total) by mouth daily. 90 tablet 1   Multiple Vitamins-Minerals (PRESERVISION AREDS 2 PO) Take by mouth.     nitroGLYCERIN (NITROGLYN) 2 % OINT ointment Apply 1 application topically in the morning, at noon, and at bedtime. 1 application= 1/4 inch to fingertips. 60 g 0   NONFORMULARY OR COMPOUNDED ITEM Testosterone propionate 2% in white petrolatum, apply small amount once daily for 5 days per week. 60 each 0   ondansetron (ZOFRAN) 4 MG tablet TAKE ONE TABLET EVERY EIGHT HOURS AS NEEDED FOR NAUSEA / VOMITING 30 tablet 0   ondansetron (ZOFRAN-ODT) 4 MG disintegrating tablet TAKE 1 TABLET BY MOUTH EVERY 8 HOURS AS NEEDED FOR NAUSEA OR VOMITING 20 tablet 1   pantoprazole (PROTONIX) 40 MG tablet TAKE 1 TABLET BY MOUTH TWICE DAILY WITH A MEAL. 180 tablet 1   pentosan polysulfate (ELMIRON) 100 MG capsule Take 200 mg by mouth 2 (two) times daily.      phenazopyridine (PYRIDIUM) 100 MG tablet Take by mouth as needed.      polyethylene glycol (MIRALAX / GLYCOLAX) 17 g packet Take 17 g by mouth 3 (three) times daily.     Prasterone (INTRAROSA) 6.5 MG INST Place 1 suppository vaginally at bedtime. 84 each 3   pregabalin (LYRICA) 150 MG capsule TAKE 1 CAPSULE BY MOUTH TWICE DAILY 180 capsule 0   PREVIDENT 5000 BOOSTER PLUS 1.1 % PSTE See admin instructions.     senna (SENOKOT) 8.6 MG tablet Take 2 tablets by mouth daily as needed.      tiZANidine (ZANAFLEX) 2 MG tablet Take 1 tablet (2 mg total) by mouth every 8 (eight) hours as needed. 30 tablet 0   triamcinolone ointment (KENALOG) 0.1 % Apply 1 application topically 2 (two) times daily. As needed     UNABLE TO FIND Med Name: Vit D and Calcium powder. Mix in 8 oz of water qd     ursodiol (ACTIGALL) 300 MG capsule TAKE 1 CAPSULE BY MOUTH 2 TIMES DAILY. 180 capsule 3   amoxicillin (AMOXIL) 500 MG tablet Take 4 tablets (2,000mg ) by mouth 30-60 minutes prior to dental cleaning. (Patient not taking: Reported on 06/23/2021) 4 tablet 0   No current  facility-administered medications for this encounter.    Allergies  Allergen Reactions   Celebrex [Celecoxib] Rash    Headaches   Inh [Isoniazid] Other (See Comments)    Hepatitis   Cymbalta [Duloxetine Hcl] Other (See Comments)    Urinary retention   Nucynta [Tapentadol] Other (See Comments)    Agitation, jerking in legs   Silenor [Doxepin Hcl] Other (See Comments)    nightmare, dizziness,  stumbling upon walking, not effective   Benzoin Dermatitis   Fentanyl Other (See Comments)    Cold intolerance, arms twitching, insomnia, fatigue   Codeine Phosphate Nausea And Vomiting and Other (See Comments)    Severe n&v   Lithium Other (See Comments)    Sores in mouth    Meperidine Hcl Rash and Other (See Comments)    REACTION: red skin   Sulfamethoxazole Rash      Social History   Socioeconomic History   Marital status: Married    Spouse name: Not on file   Number of children: Not on file   Years of education: Not on file   Highest education level: Not on file  Occupational History   Occupation: retired    Fish farm manager: UNEMPLOYED  Tobacco Use   Smoking status: Never   Smokeless tobacco: Never  Vaping Use   Vaping Use: Never used  Substance and Sexual Activity   Alcohol use: No    Alcohol/week: 0.0 standard drinks   Drug use: No   Sexual activity: Yes    Partners: Male    Birth control/protection: Post-menopausal    Comment: vasectomy  Other Topics Concern   Not on file  Social History Narrative   Lives with husband and dog   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   Activity: tries to walk dog (45 min 3x/wk), yoga   Diet: good water, fruits/vegetables daily      Rheum: [name]   Psychiatrist: Kaur   Surgery: Redmond Pulling   [place] Plan   Urology: Amalia Hailey   Social Determinants of Health   Financial Resource Strain: Not on file  Food Insecurity: Not on file  Transportation Needs: Not on file  Physical Activity: Not on file  Stress:  Not on file  Social Connections: Not on file  Intimate Partner Violence: Not on file      Family History  Problem Relation Age of Onset   CAD Father 76       MI, nonsmoker   Esophageal cancer Father 51   Stomach cancer Father    Scleroderma Mother    Hypertension Mother    Esophageal cancer Paternal Grandfather    Stomach cancer Paternal Grandfather    Diabetes Maternal Grandmother    Arthritis Brother    Stroke Neg Hx    Colon cancer Neg Hx    Rectal cancer Neg Hx     Vitals:   06/23/21 1158  BP: 124/80  Pulse: 88  SpO2: 99%  Weight: 58.2 kg (128 lb 3.2 oz)    PHYSICAL EXAM: General:  Well appearing. No resp difficulty HEENT: normal + telengectascias Neck: supple. no JVD. Carotids 2+ bilat; no bruits. No lymphadenopathy or thryomegaly appreciated. Cor: PMI nondisplaced. Regular rate & rhythm. No rubs, gallops or murmurs. Lungs: clear Abdomen: soft, nontender, nondistended. No hepatosplenomegaly. No bruits or masses. Good bowel sounds. Extremities: no cyanosis, clubbing, rash, edema Neuro: alert & orientedx3, cranial nerves grossly intact. moves all 4 extremities w/o difficulty. Affect pleasant   ECG (today): NSR 89 No ST-T wave abnormalities. Personally reviewed   ASSESSMENT & PLAN:  1. Systemic sclerosis - hi-res chest CT 11/21 - no evidence of ILD - PFTs ok 07/07/20 - 06/22/20 EF 55-60% RV normal no evidence PAH - Echo today 06/23/21 EF 55% RV normal. No evidence PAH or RV strain Personally reviewed - no evidence of PAH by echo. Await PFTs with Dr. Vaughan Browner - Continue yearly screening   2. HTN - Blood  pressure well controlled. Continue current regimen.  3. Coronary calcium on chest CT - will get formal coronary calcium scoring  - continue ASA - will likely need statin   Glori Bickers, MD  12:24 PM

## 2021-06-23 NOTE — Telephone Encounter (Addendum)
Changed 07/14/21 visit to [place] visit.  Left message on vm per dpr relaying Dr. Synthia Innocent message and notifying pt the 07/14/21 visit was changed.  Asked pt to call back to r/s 07/12/21 lab visit to be done at [facility] office.

## 2021-06-23 NOTE — Patient Instructions (Addendum)
You have been referred to have a Coronary Calcium Score CT, they will call you to schedule this, the cost is $99  Your physician recommends that you schedule a follow-up appointment in: 1 year with an echocardiogram (Nov 2023), **PLEASE CALL OUR OFFICE IN SEPT TO SCHEDULE THIS  If you have any questions or concerns before your next appointment please send [place] a message through [place] or call our office at [phone].    TO LEAVE A MESSAGE FOR THE NURSE SELECT OPTION 2, PLEASE LEAVE A MESSAGE INCLUDING: YOUR NAME DATE OF BIRTH CALL BACK NUMBER REASON FOR CALL**this is important as we prioritize the call backs  YOU WILL RECEIVE A CALL BACK THE SAME DAY AS LONG AS YOU CALL BEFORE 4:00 PM  At the [facility], you and your health needs are our priority. As part of our continuing mission to provide you with exceptional heart care, we have created designated Provider Care Teams. These Care Teams include your primary Cardiologist (physician) and Advanced Practice Providers (APPs- Physician Assistants and Nurse Practitioners) who all work together to provide you with the care you need, when you need it.   You may see any of the following providers on your designated Care Team at your next follow up: Dr Glori Bickers Dr Haynes Kerns, NP Lyda Jester, [place] [facility] [place], [place] Audry Riles, PharmD   Please be sure to bring in all your medications bottles to every appointment.

## 2021-06-24 NOTE — Telephone Encounter (Signed)
Pt rtn call.  R/s lab visit on 07/09/21 at 11:00 at [facility].  Pt will call back to r/s [facility] wellness and CPE.

## 2021-06-29 ENCOUNTER — Other Ambulatory Visit: Payer: Self-pay | Admitting: Family Medicine

## 2021-06-30 ENCOUNTER — Encounter: Payer: Self-pay | Admitting: Rheumatology

## 2021-07-01 NOTE — Telephone Encounter (Signed)
The discoloration on the top of the hand could be due to increased capillary fragility.  It will be hard to make a decision without examining the hand.  I would just continue the Norvasc for now.  The [name] typically start from the fingertips.

## 2021-07-02 NOTE — Addendum Note (Signed)
Encounter addended by: Scarlette Calico, RN on: 07/02/2021 2:36 PM  Actions taken: Visit diagnoses modified, Order list changed, Diagnosis association updated

## 2021-07-06 ENCOUNTER — Telehealth: Payer: Self-pay | Admitting: *Deleted

## 2021-07-06 DIAGNOSIS — E611 Iron deficiency: Secondary | ICD-10-CM

## 2021-07-06 DIAGNOSIS — M85851 Other specified disorders of bone density and structure, right thigh: Secondary | ICD-10-CM

## 2021-07-06 DIAGNOSIS — R946 Abnormal results of thyroid function studies: Secondary | ICD-10-CM

## 2021-07-06 DIAGNOSIS — E785 Hyperlipidemia, unspecified: Secondary | ICD-10-CM

## 2021-07-06 DIAGNOSIS — R7303 Prediabetes: Secondary | ICD-10-CM

## 2021-07-06 NOTE — Telephone Encounter (Signed)
Labs ordered.

## 2021-07-06 NOTE — Telephone Encounter (Signed)
Please place future orders for lab appt.  

## 2021-07-07 ENCOUNTER — Ambulatory Visit (INDEPENDENT_AMBULATORY_CARE_PROVIDER_SITE_OTHER): Payer: Medicare Other | Admitting: Surgical

## 2021-07-07 ENCOUNTER — Other Ambulatory Visit: Payer: Self-pay

## 2021-07-07 DIAGNOSIS — Z9889 Other specified postprocedural states: Secondary | ICD-10-CM

## 2021-07-09 ENCOUNTER — Other Ambulatory Visit: Payer: Self-pay

## 2021-07-09 ENCOUNTER — Other Ambulatory Visit (INDEPENDENT_AMBULATORY_CARE_PROVIDER_SITE_OTHER): Payer: Medicare Other

## 2021-07-09 DIAGNOSIS — E785 Hyperlipidemia, unspecified: Secondary | ICD-10-CM

## 2021-07-09 DIAGNOSIS — R946 Abnormal results of thyroid function studies: Secondary | ICD-10-CM | POA: Diagnosis not present

## 2021-07-09 DIAGNOSIS — E611 Iron deficiency: Secondary | ICD-10-CM | POA: Diagnosis not present

## 2021-07-09 DIAGNOSIS — R7303 Prediabetes: Secondary | ICD-10-CM | POA: Diagnosis not present

## 2021-07-09 DIAGNOSIS — M85851 Other specified disorders of bone density and structure, right thigh: Secondary | ICD-10-CM | POA: Diagnosis not present

## 2021-07-09 LAB — COMPREHENSIVE METABOLIC PANEL
ALT: 12 U/L (ref 0–35)
AST: 19 U/L (ref 0–37)
Albumin: 4.5 g/dL (ref 3.5–5.2)
Alkaline Phosphatase: 94 U/L (ref 39–117)
BUN: 9 mg/dL (ref 6–23)
CO2: 30 mEq/L (ref 19–32)
Calcium: 9.7 mg/dL (ref 8.4–10.5)
Chloride: 99 mEq/L (ref 96–112)
Creatinine, Ser: 0.65 mg/dL (ref 0.40–1.20)
GFR: 93.68 mL/min (ref >60.00)
Glucose, Bld: 107 mg/dL — ABNORMAL HIGH (ref 70–99)
Potassium: 4.4 mEq/L (ref 3.5–5.1)
Sodium: 135 mEq/L (ref 135–145)
Total Bilirubin: 0.6 mg/dL (ref 0.2–1.2)
Total Protein: 6.8 g/dL (ref 6.0–8.3)

## 2021-07-09 LAB — LIPID PANEL
Cholesterol: 198 mg/dL (ref 0–200)
HDL: 75 mg/dL (ref >39.00)
LDL Cholesterol: 97 mg/dL (ref 0–99)
NonHDL: 122.84
Total CHOL/HDL Ratio: 3
Triglycerides: 131 mg/dL (ref 0.0–149.0)
VLDL: 26.2 mg/dL (ref 0.0–40.0)

## 2021-07-09 LAB — IBC PANEL
Iron: 86 ug/dL (ref 42–145)
Saturation Ratios: 21 % (ref 20.0–50.0)
TIBC: 408.8 ug/dL (ref 250.0–450.0)
Transferrin: 292 mg/dL (ref 212.0–360.0)

## 2021-07-09 LAB — HEMOGLOBIN A1C: Hgb A1c MFr Bld: 5.9 % (ref 4.6–6.5)

## 2021-07-09 LAB — VITAMIN D 25 HYDROXY (VIT D DEFICIENCY, FRACTURES): VITD: 40.23 ng/mL (ref 30.00–100.00)

## 2021-07-09 LAB — T4, FREE: Free T4: 0.84 ng/dL (ref 0.60–1.60)

## 2021-07-09 LAB — FERRITIN: Ferritin: 62 ng/mL (ref 10.0–291.0)

## 2021-07-09 LAB — TSH: TSH: 2.28 u[IU]/mL (ref 0.35–5.50)

## 2021-07-11 ENCOUNTER — Encounter: Payer: Self-pay | Admitting: Orthopedic Surgery

## 2021-07-11 ENCOUNTER — Encounter: Payer: Self-pay | Admitting: Family Medicine

## 2021-07-11 NOTE — Progress Notes (Signed)
Post-Op Visit Note   Patient: Rachel Duran           Date of Birth: 1955/07/27           MRN: [id number] Visit Date: 07/07/2021 PCP: Ria Bush, MD   Assessment & Plan:  Chief Complaint:  Chief Complaint  Patient presents with   Right Knee - Pain   Visit Diagnoses:  1. S/P right knee arthroscopy     Plan: Patient is a 65 year old female who presents s/p right knee arthroscopy with debridement of inferior patellar osteophyte on 04/06/2021.  She states that overall since the procedure the debilitating anterior pain has significantly improved.  The snapping sensation that she was complaining at her last several visits has completely resolved.  She does note some increased stiffness over the last couple weeks as the temperature has dropped.  She has pain after walking around all day that usually sets in at 3:57 PM.  Continued difficulty with stairs.  No instability of the patella.  She is taking her baseline pain medication.  No fevers, chills, night sweats, drainage from the incisions.  On examination, patient has 0 degrees extension and 120 degrees of knee flexion.  No snapping sensation laterally noted as on previous exam.  No significant tenderness over the medial or lateral joint lines or the patellar tendon/inferior pole the patella.  Incisions are well-healed without any evidence of infection.  No effusion noted.  No calf tenderness.  Negative Homans' sign.  Plan is for patient to continue his stationary bike for exercise as she feels this is helping.  No kneeling on the operative knee.  Her severe anterior pain that was the primary cause for concern has significantly improved at this point.  Plan for patient to follow-up with the office as needed.  Follow-Up Instructions: No follow-ups on file.   Orders:  No orders of the defined types were placed in this encounter.  No orders of the defined types were placed in this encounter.   Imaging: No results  found.  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   Chronic patellofemoral pain of right knee 05/05/2020   Positive ANA (antinuclear antibody) 06/05/2019   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   Unilateral primary osteoarthritis, left hip 01/28/2019   Pelvic pain 12/12/2018   Common bile duct (CBD) obstruction    Venous insufficiency of left lower extremity 07/04/2018   Dyslipidemia 09/22/2017   DDD (degenerative disc disease), cervical 04/03/2017   DDD (degenerative disc disease), lumbar 04/03/2017   Onychomycosis 03/21/2017   Dysphagia 10/18/2016   Encounter for chronic pain management 10/18/2016   Biliary stasis 09/26/2016   Fever blister 08/18/2016   HNP (herniated nucleus pulposus), lumbar 09/23/2015   Sinus congestion 07/30/2015   Iron deficiency 07/30/2015   Health maintenance examination 06/15/2015   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   Headache 03/09/2015   Clavicle enlargement 12/13/2014   Prediabetes 12/13/2014   Plantar fasciitis, bilateral 09/11/2014   Advanced care planning/counseling discussion 06/10/2014   Medicare annual wellness visit, subsequent 06/10/2014   Abnormal thyroid function test 06/10/2014   Chronic pain syndrome 06/10/2014   CFS (chronic fatigue syndrome) 04/08/2014   Postmenopausal atrophic vaginitis 10/19/2012   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   Cervical  disc disorder with radiculopathy of cervical region 05/28/2010   CHEST PAIN UNSPECIFIED 02/28/2008   APHTHOUS ULCERS 01/31/2008   Chronic insomnia 11/09/2007   Drug-induced constipation 10/11/2007   Allergic rhinitis 04/12/2007   MDD (major depressive disorder), recurrent episode, moderate ([place]) 03/05/2007   [name] syndrome  03/05/2007   GERD 03/05/2007   ROSACEA 03/05/2007   NEURALGIA 03/05/2007   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 C6- C7 -hx. past fusion(bone graft used)   Cholecystitis    Chronic pain    DDD (degenerative disc disease), lumbar 09/2015   dextroscoliosis with multilevel DDD and facet arthrosis most notable for R foraminal disc protrusion L4/5 producing severe R neural foraminal stenosis abutting R L4 nerve root, moderate spinal canal and mild lat recesss and R neural foraminal stenosis L3/4 (MRI)   Depression    bipolar depression   Disorders of porphyrin metabolism    Felon of finger of left hand 11/10/2016   Fibromyalgia    GERD (gastroesophageal reflux disease)    Headache    occasionally   Internal hemorrhoids    Interstitial cystitis 06/06/2012   hx.   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 (Dr Baird Cancer retinologist)    Family History  Problem Relation Age of Onset   CAD Father 75       MI, nonsmoker   Esophageal cancer Father 39   Stomach cancer Father    Scleroderma Mother    Hypertension Mother    Esophageal cancer Paternal Grandfather    Stomach cancer Paternal Grandfather    Diabetes Maternal Grandmother    Arthritis Brother     Stroke Neg Hx    Colon cancer Neg Hx    Rectal cancer Neg Hx     Past Surgical History:  Procedure Laterality Date   ANTERIOR CERVICAL DECOMP/DISCECTOMY FUSION  2004   C5/6, C6/7   ANTERIOR CERVICAL DECOMP/DISCECTOMY FUSION  02/2016   C3/4, C4/5 with plating [name])   AUGMENTATION MAMMAPLASTY Bilateral 03/25/2010   BREAST ENHANCEMENT SURGERY  2010   BREAST IMPLANT EXCHANGE  10/2014   exchange saline implants, B mastopexy/capsulorraphy ([facility])   BUNIONECTOMY Bilateral yrs ago   [place]   x 1   CHOLECYSTECTOMY  06/11/2012   Procedure: LAPAROSCOPIC CHOLECYSTECTOMY WITH INTRAOPERATIVE CHOLANGIOGRAM;  Surgeon: [name], MD,FACS;  Location: [facility];  Service: General;  Laterality: N/A;   COLONOSCOPY  02/2018   done for positive cologuard - WNL, rpt 10 yrs Fuller Plan)   CYSTOSCOPY     ERCP  05/22/2012   Procedure: ENDOSCOPIC RETROGRADE CHOLANGIOPANCREATOGRAPHY (ERCP);  Surgeon: [name], MD,FACG;  Location: [facility];  Service: Endoscopy;  Laterality: N/A;   ERCP N/A 09/17/2013   Procedure: ENDOSCOPIC RETROGRADE CHOLANGIOPANCREATOGRAPHY (ERCP);  Surgeon: [name], MD;  Location: [facility];  Service: Endoscopy;  Laterality: N/A;   ERCP N/A 09/27/2018   Procedure: ENDOSCOPIC RETROGRADE CHOLANGIOPANCREATOGRAPHY (ERCP);  Surgeon: [name], MD;  Location: [facility];  Service: Endoscopy;  Laterality: N/A;   ESOPHAGOGASTRODUODENOSCOPY  09/2016   WNL. esophagus dilated Fuller Plan)   HEMORRHOID BANDING  09-23-13   --Dr. Greer Pickerel   HERNIA REPAIR     inguinal   HYSTEROSCOPY W/ ENDOMETRIAL ABLATION     KNEE ARTHROSCOPY Right 04/06/2021   Procedure: RIGHT KNEE ARTHROSCOPY WITH DEBRIDEMENT;  Surgeon: [name], MD;  Location: [facility];  Service: Orthopedics;  Laterality: Right;   NASAL SINUS SURGERY     x5   PARTIAL KNEE ARTHROPLASTY Right 06/01/2020   Procedure: Right knee patellofemoral replacement;  Surgeon: [name], MD;  Location: [facility];  Service: Orthopedics;  Laterality: Right;   REMOVAL OF STONES  09/27/2018   Procedure: REMOVAL OF STONES;  Surgeon: [name], MD;  Location: [facility];  Service: Endoscopy;;   SPHINCTEROTOMY  09/27/2018   Procedure: SPHINCTEROTOMY;  Surgeon: [name], MD;  Location: [facility];  Service: Endoscopy;;   TOTAL HIP ARTHROPLASTY Left 03/12/2019   Procedure: LEFT TOTAL HIP ARTHROPLASTY ANTERIOR APPROACH;  [name], [name], MD)   UPPER GASTROINTESTINAL ENDOSCOPY     Social History   Occupational History   Occupation: retired    Fish farm manager: UNEMPLOYED  Tobacco Use   Smoking status: Never   Smokeless tobacco: Never  Vaping Use   Vaping Use: Never used  Substance and Sexual Activity   Alcohol use: No    Alcohol/week: 0.0 standard drinks   Drug use: No   Sexual activity: Yes    Partners: Male    Birth control/protection: Post-menopausal    Comment: vasectomy

## 2021-07-12 ENCOUNTER — Other Ambulatory Visit: Payer: Medicare Other

## 2021-07-13 ENCOUNTER — Other Ambulatory Visit: Payer: Self-pay | Admitting: Family Medicine

## 2021-07-13 NOTE — Telephone Encounter (Signed)
Name of Medication: Eszopiclone Name of Pharmacy: [facility] or Written Date and Quantity: 04/19/21, #90 Last Office Visit and Type: 04/16/21, chronic pain f/u Next Office Visit and Type: 07/14/21, f/u Last Controlled Substance Agreement Date: 04/19/19 Last UDS: 04/19/19

## 2021-07-14 ENCOUNTER — Encounter: Payer: Self-pay | Admitting: Family Medicine

## 2021-07-14 ENCOUNTER — Telehealth (INDEPENDENT_AMBULATORY_CARE_PROVIDER_SITE_OTHER): Payer: Medicare Other | Admitting: Family Medicine

## 2021-07-14 ENCOUNTER — Other Ambulatory Visit: Payer: Self-pay

## 2021-07-14 VITALS — BP 120/72 | HR 78 | Temp 98.4°F | Ht 63.5 in | Wt 127.0 lb

## 2021-07-14 DIAGNOSIS — E611 Iron deficiency: Secondary | ICD-10-CM

## 2021-07-14 DIAGNOSIS — K5903 Drug induced constipation: Secondary | ICD-10-CM | POA: Diagnosis not present

## 2021-07-14 DIAGNOSIS — R7303 Prediabetes: Secondary | ICD-10-CM

## 2021-07-14 DIAGNOSIS — H3553 Other dystrophies primarily involving the sensory retina: Secondary | ICD-10-CM

## 2021-07-14 DIAGNOSIS — M7989 Other specified soft tissue disorders: Secondary | ICD-10-CM

## 2021-07-14 DIAGNOSIS — G894 Chronic pain syndrome: Secondary | ICD-10-CM | POA: Diagnosis not present

## 2021-07-14 DIAGNOSIS — G8929 Other chronic pain: Secondary | ICD-10-CM | POA: Diagnosis not present

## 2021-07-14 DIAGNOSIS — M349 Systemic sclerosis, unspecified: Secondary | ICD-10-CM

## 2021-07-14 MED ORDER — MORPHINE SULFATE 15 MG PO TABS: ORAL_TABLET | ORAL | 0 refills | Status: DC

## 2021-07-14 MED ORDER — DOXYCYCLINE HYCLATE 100 MG PO TABS: 100.0000 mg | ORAL_TABLET | Freq: Two times a day (BID) | ORAL | 0 refills | Status: DC

## 2021-07-14 NOTE — Assessment & Plan Note (Addendum)
FM, OA, IC, DDD all contribute.  Continues lyrica 150mg  bid.

## 2021-07-14 NOTE — Telephone Encounter (Signed)
Will evaluate at OV this afternoon.

## 2021-07-14 NOTE — Assessment & Plan Note (Signed)
Progression noted R>L on latest retinal exam.  Poor vision prognosis over next 5 years.  To get plugged in with low vision specialist through ophthalmology office.

## 2021-07-14 NOTE — Assessment & Plan Note (Signed)
Iron levels in normal range on oral iron MWF

## 2021-07-14 NOTE — Assessment & Plan Note (Addendum)
A1c stable in prediabetes range - discussed caution with added sugars in diet.

## 2021-07-14 NOTE — Assessment & Plan Note (Addendum)
[place] CSRS reviewed Stable period on morphine - continue.

## 2021-07-14 NOTE — Assessment & Plan Note (Signed)
Closely followed by cards, pulm, and rheum.

## 2021-07-14 NOTE — Assessment & Plan Note (Signed)
Continue current regimen - linzess, movantik, miralax daily with PRN senokot/MOM

## 2021-07-14 NOTE — Progress Notes (Signed)
Patient ID: Rachel Duran, female    DOB: 28-Feb-1956, 65 y.o.   MRN: [id number]  Virtual visit completed through [place], a video enabled telemedicine application. Due to national recommendations of social distancing due to COVID-19, a virtual visit is felt to be most appropriate for this patient at this time. Reviewed limitations, risks, security and privacy concerns of performing a virtual visit and the availability of in person appointments. I also reviewed that there may be a patient responsible charge related to this service. The patient agreed to proceed.   Patient location: home Provider location: [place] at [facility], office Persons participating in this virtual visit: patient, provider   If any vitals were documented, they were collected by patient at home unless specified below.    BP 120/72    Pulse 78    Temp 98.4 F (36.9 C)    Ht 5' 3.5" (1.613 m)    Wt 127 lb (57.6 kg)    LMP 07/25/1998    BMI 22.14 kg/m    CC: chronic pain f/u visit  Subjective:   HPI: Rachel Duran is a 65 y.o. female presenting on 07/14/2021 for Follow-up   Continues recovering from recent knee surgery (03/2021). Still notes difficulty going down stairs.   Saw Dr Baird Cancer and Ellie Lunch - noted progression of Stargart's disease especially to R eye. No longer driving at night but able to continue driving during the day.   See recent MyChart message - on 07/09/2021 noted redness with discoloration to right index fingernail with some swelling redness and discomfort.   Known scleroderma/limited systemic sclerosis - saw pulm and cardiology with reassuring evaluations. PFTs returned normal. Pending CT cardiac scoring for further risk stratification.   Longstanding chronic pain managed with opiate - current regimen is MSIR morphine 15mg  TID PRN #90/month (takes 2-3 tabs daily). Tolerates medication well without oversedation, gait unsteadiness. She also takes lyrica 150mg  BID.  Back pain  flared - lower back (known scoliosis) and thoracic back (rhomboids).  Continues walking regularly.   Chronic constipation managed with daily linzess, miralax, movantik. Also on PRN senokot and MOM. Motegrity didn't help.      Relevant past medical, surgical, family and social history reviewed and updated as indicated. Interim medical history since our last visit reviewed. Allergies and medications reviewed and updated. Outpatient Medications Prior to Visit  Medication Sig Dispense Refill   ALPRAZolam (XANAX) 1 MG tablet Take 0.5 mg by mouth at bedtime.  0   amLODipine (NORVASC) 2.5 MG tablet TAKE 1 TABLET BY MOUTH DAILY WITH AMLODIPINE 5 MG TABLET. (7.5 MG TOTAL DAILY. 90 tablet 0   amLODipine (NORVASC) 5 MG tablet Take 5 mg by mouth daily.     amoxicillin (AMOXIL) 500 MG tablet Take 4 tablets (2,000mg ) by mouth 30-60 minutes prior to dental cleaning. 4 tablet 0   amphetamine-dextroamphetamine (ADDERALL XR) 30 MG 24 hr capsule TAKE 1 CAPSULE BY MOUTH EACH MORNING 90 capsule 0   ARIPiprazole (ABILIFY) 5 MG tablet Take 5 mg by mouth daily.     aspirin 81 MG chewable tablet Chew 1 tablet (81 mg total) by mouth daily. 30 tablet 0   cyanocobalamin (,VITAMIN B-12,) 1000 MCG/ML injection INJECT 1ML INTO THE MUSCLE EVERY 21 DAYSAS DIRECTED 3 mL 3   diclofenac Sodium (VOLTAREN) 1 % GEL APPLY TWO TO FOUR GRAMS TO AFFECTED JOINT UP TO FOUR TIMES A DAY 400 g 2   Eszopiclone 3 MG TABS TAKE 1 TABLET BY MOUTH AT BEDTIME TAKE  IMMEDIATLEY BEFORE BEDTIME AS DIRECTED. 90 tablet 0   ferrous sulfate 325 (65 FE) MG tablet Take 325 mg by mouth every Monday, Wednesday, and Friday.      fluticasone (FLONASE) 50 MCG/ACT nasal spray Place 1 spray into both nostrils daily as needed for allergies. 16 g 5   lamoTRIgine (LAMICTAL) 100 MG tablet Take 100 mg by mouth daily.     linaclotide (LINZESS) 290 MCG CAPS capsule Take 1 capsule (290 mcg total) by mouth daily. 90 capsule 1   MOVANTIK 25 MG TABS tablet Take 1 tablet (25  mg total) by mouth daily. 90 tablet 1   Multiple Vitamins-Minerals (PRESERVISION AREDS 2 PO) Take by mouth.     nitroGLYCERIN (NITROGLYN) 2 % OINT ointment Apply 1 application topically in the morning, at noon, and at bedtime. 1 application= 1/4 inch to fingertips. 60 g 0   NONFORMULARY OR COMPOUNDED ITEM Testosterone propionate 2% in white petrolatum, apply small amount once daily for 5 days per week. 60 each 0   ondansetron (ZOFRAN) 4 MG tablet TAKE ONE TABLET EVERY EIGHT HOURS AS NEEDED FOR NAUSEA / VOMITING 30 tablet 0   ondansetron (ZOFRAN-ODT) 4 MG disintegrating tablet TAKE 1 TABLET BY MOUTH EVERY 8 HOURS AS NEEDED FOR NAUSEA OR VOMITING 20 tablet 1   pantoprazole (PROTONIX) 40 MG tablet TAKE 1 TABLET BY MOUTH TWICE DAILY WITH A MEAL. 180 tablet 1   pentosan polysulfate (ELMIRON) 100 MG capsule Take 200 mg by mouth 2 (two) times daily.      phenazopyridine (PYRIDIUM) 100 MG tablet Take by mouth as needed.      polyethylene glycol (MIRALAX / GLYCOLAX) 17 g packet Take 17 g by mouth 3 (three) times daily.     Prasterone (INTRAROSA) 6.5 MG INST Place 1 suppository vaginally at bedtime. 84 each 3   pregabalin (LYRICA) 150 MG capsule TAKE 1 CAPSULE BY MOUTH TWICE DAILY 180 capsule 0   PREVIDENT 5000 BOOSTER PLUS 1.1 % PSTE See admin instructions.     senna (SENOKOT) 8.6 MG tablet Take 2 tablets by mouth daily as needed.      tiZANidine (ZANAFLEX) 2 MG tablet TAKE 1 TABLET BY MOUTH EVERY 8 HOURS AS NEEDED 30 tablet 0   triamcinolone ointment (KENALOG) 0.1 % Apply 1 application topically 2 (two) times daily. As needed     UNABLE TO FIND Med Name: Vit D and Calcium powder. Mix in 8 oz of water qd     ursodiol (ACTIGALL) 300 MG capsule TAKE 1 CAPSULE BY MOUTH 2 TIMES DAILY. 180 capsule 3   morphine (MSIR) 15 MG tablet TAKE 1 TABLET BY MOUTH 3 TIMES DAILY AS NEEDED FOR SEVERE PAIN OR MODERATE PAIN. 90 tablet 0   No facility-administered medications prior to visit.     Per HPI unless specifically  indicated in ROS section below Review of Systems Objective:  BP 120/72    Pulse 78    Temp 98.4 F (36.9 C)    Ht 5' 3.5" (1.613 m)    Wt 127 lb (57.6 kg)    LMP 07/25/1998    BMI 22.14 kg/m   Wt Readings from Last 3 Encounters:  07/14/21 127 lb (57.6 kg)  06/23/21 128 lb 3.2 oz (58.2 kg)  05/05/21 130 lb (59 kg)       Physical exam: Gen: alert, NAD, not ill appearing Pulm: speaks in complete sentences without increased work of breathing Psych: normal mood, normal thought content      Results for  orders placed or performed in visit on 07/09/21  Hemoglobin A1c  Result Value Ref Range   Hgb A1c MFr Bld 5.9 4.6 - 6.5 %  T4, free  Result Value Ref Range   Free T4 0.84 0.60 - 1.60 ng/dL  TSH  Result Value Ref Range   TSH 2.28 0.35 - 5.50 uIU/mL  Comprehensive metabolic panel  Result Value Ref Range   Sodium 135 135 - 145 mEq/L   Potassium 4.4 3.5 - 5.1 mEq/L   Chloride 99 96 - 112 mEq/L   CO2 30 19 - 32 mEq/L   Glucose, Bld 107 (H) 70 - 99 mg/dL   BUN 9 6 - 23 mg/dL   Creatinine, Ser 0.65 0.40 - 1.20 mg/dL   Total Bilirubin 0.6 0.2 - 1.2 mg/dL   Alkaline Phosphatase 94 39 - 117 U/L   AST 19 0 - 37 U/L   ALT 12 0 - 35 U/L   Total Protein 6.8 6.0 - 8.3 g/dL   Albumin 4.5 3.5 - 5.2 g/dL   GFR 93.68 >60.00 mL/min   Calcium 9.7 8.4 - 10.5 mg/dL  Lipid panel  Result Value Ref Range   Cholesterol 198 0 - 200 mg/dL   Triglycerides 131.0 0.0 - 149.0 mg/dL   HDL 75.00 >39.00 mg/dL   VLDL 26.2 0.0 - 40.0 mg/dL   LDL Cholesterol 97 0 - 99 mg/dL   Total CHOL/HDL Ratio 3    NonHDL 122.84   VITAMIN D 25 Hydroxy (Vit-D Deficiency, Fractures)  Result Value Ref Range   VITD 40.23 30.00 - 100.00 ng/mL  IBC panel  Result Value Ref Range   Iron 86 42 - 145 ug/dL   Transferrin 292.0 212.0 - 360.0 mg/dL   Saturation Ratios 21.0 20.0 - 50.0 %   TIBC 408.8 250.0 - 450.0 mcg/dL  Ferritin  Result Value Ref Range   Ferritin 62.0 10.0 - 291.0 ng/mL   *Note: Due to a large number of  results and/or encounters for the requested time period, some results have not been displayed. A complete set of results can be found in Results Review.   Assessment & Plan:   Problem List Items Addressed This Visit     Encounter for chronic pain management - Primary (Chronic)    [place] CSRS reviewed Stable period on morphine - continue.       Drug-induced constipation    Continue current regimen - linzess, movantik, miralax daily with PRN senokot/MOM      Chronic pain syndrome    FM, OA, IC, DDD all contribute.  Continues lyrica 150mg  bid.       Relevant Medications   morphine (MSIR) 15 MG tablet   Prediabetes    A1c stable in prediabetes range - discussed caution with added sugars in diet.       Stargardt's disease    Progression noted R>L on latest retinal exam.  Poor vision prognosis over next 5 years.  To get plugged in with low vision specialist through ophthalmology office.      Iron deficiency    Iron levels in normal range on oral iron MWF      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[facility])    Closely followed by cards, pulm, and rheum.       Swelling of right index finger    Unable to evaluate today given video technical difficulties.  ?paronychia vs dactylitis ?related to systemic sclerosis.  Will treat empirically with doxycycline 1 wk course, if ongoing to touch base with rheum  at upcoming appt in 2 wks.         Meds ordered this encounter  Medications   morphine (MSIR) 15 MG tablet    Sig: TAKE 1 TABLET BY MOUTH 3 TIMES DAILY AS NEEDED FOR SEVERE PAIN OR MODERATE PAIN    Dispense:  90 tablet    Refill:  0   doxycycline (VIBRA-TABS) 100 MG tablet    Sig: Take 1 tablet (100 mg total) by mouth 2 (two) times daily.    Dispense:  14 tablet    Refill:  0   No orders of the defined types were placed in this encounter.   I discussed the assessment and treatment plan with the patient. The patient was provided an opportunity to ask questions and all were answered. The  patient agreed with the plan and demonstrated an understanding of the instructions. The patient was advised to call back or seek an in-person evaluation if the symptoms worsen or if the condition fails to improve as anticipated.  Follow up plan: Return if symptoms worsen or fail to improve.  Ria Bush, MD

## 2021-07-14 NOTE — Assessment & Plan Note (Signed)
Unable to evaluate today given video technical difficulties.  ?paronychia vs dactylitis ?related to systemic sclerosis.  Will treat empirically with doxycycline 1 wk course, if ongoing to touch base with rheum at upcoming appt in 2 wks.

## 2021-07-14 NOTE — Telephone Encounter (Signed)
ERx 

## 2021-07-15 MED ORDER — ESZOPICLONE 3 MG PO TABS: ORAL_TABLET | ORAL | 0 refills | Status: DC

## 2021-07-15 MED ORDER — MORPHINE SULFATE 15 MG PO TABS: 15.0000 mg | ORAL_TABLET | Freq: Three times a day (TID) | ORAL | 0 refills | Status: DC | PRN

## 2021-07-15 MED ORDER — DOXYCYCLINE HYCLATE 100 MG PO TABS: 100.0000 mg | ORAL_TABLET | Freq: Two times a day (BID) | ORAL | 0 refills | Status: DC

## 2021-07-15 NOTE — Telephone Encounter (Addendum)
Refill left on vm at pharmacy for doxycycline.    Also, lvm asking for call back to let us know if eszopiclone and morphine (MSIR) were electronically received or not. If not, will print rxs to be faxed.

## 2021-07-15 NOTE — Telephone Encounter (Signed)
I sent meds yesterday, again sent today. Please call [facility] to ensure they received. If not received, please phone in the ones we can phone in.

## 2021-07-15 NOTE — Progress Notes (Signed)
Office Visit Note  Patient: Rachel Duran             Date of Birth: March 25, 1956           MRN: [id number]             PCP: Ria Bush, MD Referring: Ria Bush, MD Visit Date: 07/29/2021 Occupation: _0 @  Subjective:  Rachel Duran   History of Present Illness: Rachel Duran is a 65 y.o. female with history of limited systemic sclerosis, DDD, and osteoarthritis.  She is not currently taking any immunosuppressive agents.  She has had more frequent episodes of Rachel Duran phenomenon with the cooler weather temperatures.  She has been taking amlodipine 7.5 mg daily and her blood pressure has been well controlled.  She denies any digital ulcerations.  She has been using gloves to keep her hands warm.  She uses nitroglycerin ointment as needed.  She denies any increase skin tightness or thickening.  She has not had any recent rashes.  She was evaluated by Dr. Vaughan Browner in October followed by Dr. Haroldine Laws in November.  She is not experiencing any increased shortness of breath, pleuritic chest pain, cough, or palpitations.  Her chronic constipation has been well managed using MiraLAX.  Her reflux is also been well controlled taking Protonix.  She has not noticed any increased dysphagia. She has had some increased discomfort in her back which she attributes to worsening scoliosis.  She has not seen a scoliosis specialist in the past.  She has not been doing formal physical therapy for scoliosis in the past.  She has difficulty performing home exercises due to her previous neck fusion. She underwent right knee arthroscopic surgery in September which has improved her discomfort.  She denies any other increased joint pain or joint swelling at this time.  She requested a refill of voltaren gel.     Activities of Daily Living:  Patient reports morning stiffness for 2 hours.   Patient Reports nocturnal pain.  Difficulty dressing/grooming: Reports Difficulty climbing stairs:  Reports Difficulty getting out of chair: Reports Difficulty using hands for taps, buttons, cutlery, and/or writing: Reports  Review of Systems  Constitutional:  Positive for fatigue.  HENT:  Positive for mouth dryness. Negative for mouth sores and nose dryness.   Eyes:  Negative for pain, visual disturbance and dryness.  Respiratory:  Negative for cough, hemoptysis, shortness of breath and difficulty breathing.   Cardiovascular:  Negative for chest pain, palpitations, hypertension and swelling in legs/feet.  Gastrointestinal:  Positive for constipation. Negative for blood in stool and diarrhea.  Endocrine: Negative for increased urination.  Genitourinary:  Negative for difficulty urinating and painful urination.  Musculoskeletal:  Positive for joint pain, joint pain, muscle weakness, morning stiffness and muscle tenderness. Negative for joint swelling, myalgias and myalgias.  Skin:  Negative for color change, pallor, rash, hair loss, nodules/bumps, skin tightness, ulcers and sensitivity to sunlight.  Allergic/Immunologic: Negative for susceptible to infections.  Neurological:  Positive for weakness. Negative for dizziness, numbness and headaches.  Hematological:  Negative for bruising/bleeding tendency and swollen glands.  Psychiatric/Behavioral:  Positive for sleep disturbance. Negative for depressed mood. The patient is not nervous/anxious.    PMFS History:  Patient Active Problem List   Diagnosis Date Noted   Swelling of right index finger 07/14/2021   Synovitis of right knee    Scoliosis 01/13/2021   Osteopenia 07/02/2020   S/P knee surgery 06/01/2020   Pre-op evaluation 05/27/2020   Pulmonary nodule 05/27/2020  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[place]) 05/05/2020   Chronic patellofemoral pain of right knee 05/05/2020   Positive ANA (antinuclear antibody) 06/05/2019   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   Unilateral primary osteoarthritis, left hip 01/28/2019   Pelvic  pain 12/12/2018   Common bile duct (CBD) obstruction    Venous insufficiency of left lower extremity 07/04/2018   Dyslipidemia 09/22/2017   DDD (degenerative disc disease), cervical 04/03/2017   DDD (degenerative disc disease), lumbar 04/03/2017   Onychomycosis 03/21/2017   Dysphagia 10/18/2016   Encounter for chronic pain management 10/18/2016   Biliary stasis 09/26/2016   Fever blister 08/18/2016   HNP (herniated nucleus pulposus), lumbar 09/23/2015   Sinus congestion 07/30/2015   Iron deficiency 07/30/2015   Health maintenance examination 06/15/2015   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   Headache 03/09/2015   Clavicle enlargement 12/13/2014   Prediabetes 12/13/2014   Plantar fasciitis, bilateral 09/11/2014   Advanced care planning/counseling discussion 06/10/2014   Medicare annual wellness visit, subsequent 06/10/2014   Abnormal thyroid function test 06/10/2014   Chronic pain syndrome 06/10/2014   CFS (chronic fatigue syndrome) 04/08/2014   Postmenopausal atrophic vaginitis 10/19/2012   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   Cervical disc disorder with radiculopathy of cervical region 05/28/2010   CHEST PAIN UNSPECIFIED 02/28/2008   APHTHOUS ULCERS 01/31/2008   Chronic insomnia 11/09/2007   Drug-induced constipation 10/11/2007   Allergic rhinitis 04/12/2007   MDD (major depressive disorder), recurrent episode, moderate ([place]) 03/05/2007   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 C6- C7 -hx. past fusion(bone graft used)   Cholecystitis    Chronic  pain    DDD (degenerative disc disease), lumbar 09/2015   dextroscoliosis with multilevel DDD and facet arthrosis most notable for R foraminal disc protrusion L4/5 producing severe R neural foraminal stenosis abutting R L4 nerve root, moderate spinal canal and mild lat recesss and R neural foraminal stenosis L3/4 (MRI)   Depression    bipolar depression   Disorders of porphyrin metabolism    Felon of finger of left hand 11/10/2016   Fibromyalgia    GERD (gastroesophageal reflux disease)    Headache    occasionally   Internal hemorrhoids    Interstitial cystitis 06/06/2012   hx.   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 (Dr Baird Cancer retinologist)    Family History  Problem Relation Age of Onset   CAD Father 53       MI, nonsmoker   Esophageal cancer Father 10   Stomach cancer Father  Scleroderma Mother    Hypertension Mother    Esophageal cancer Paternal Grandfather    Stomach cancer Paternal Grandfather    Diabetes Maternal Grandmother    Arthritis Brother    Stroke Neg Hx    Colon cancer Neg Hx    Rectal cancer Neg Hx    Past Surgical History:  Procedure Laterality Date   ANTERIOR CERVICAL DECOMP/DISCECTOMY FUSION  2004   C5/6, C6/7   ANTERIOR CERVICAL DECOMP/DISCECTOMY FUSION  02/2016   C3/4, C4/5 with plating [name])   AUGMENTATION MAMMAPLASTY Bilateral 03/25/2010   BREAST ENHANCEMENT SURGERY  2010   BREAST IMPLANT EXCHANGE  10/2014   exchange saline implants, B mastopexy/capsulorraphy ([facility])   BUNIONECTOMY Bilateral yrs ago   [place]   x 1   CHOLECYSTECTOMY  06/11/2012   Procedure: LAPAROSCOPIC CHOLECYSTECTOMY WITH INTRAOPERATIVE CHOLANGIOGRAM;  Surgeon: [name], MD,FACS;  Location: [facility];  Service: General;  Laterality: N/A;   COLONOSCOPY  02/2018   done for positive cologuard - WNL, rpt 10 yrs Fuller Plan)   CYSTOSCOPY     ERCP  05/22/2012   Procedure: ENDOSCOPIC RETROGRADE CHOLANGIOPANCREATOGRAPHY (ERCP);  Surgeon: [name], MD,FACG;  Location: [facility];  Service: Endoscopy;  Laterality: N/A;   ERCP N/A 09/17/2013   Procedure: ENDOSCOPIC RETROGRADE CHOLANGIOPANCREATOGRAPHY (ERCP);  Surgeon: [name], MD;  Location: [facility];  Service: Endoscopy;  Laterality: N/A;   ERCP N/A 09/27/2018   Procedure: ENDOSCOPIC RETROGRADE CHOLANGIOPANCREATOGRAPHY (ERCP);  Surgeon: [name], MD;  Location: [facility];  Service: Endoscopy;  Laterality: N/A;   ESOPHAGOGASTRODUODENOSCOPY  09/2016   WNL. esophagus dilated Fuller Plan)   HEMORRHOID BANDING  09-23-13   --Dr. Greer Pickerel   HERNIA REPAIR     inguinal   HYSTEROSCOPY W/ ENDOMETRIAL ABLATION     KNEE ARTHROSCOPY Right 04/06/2021   Procedure: RIGHT KNEE ARTHROSCOPY WITH DEBRIDEMENT;  Surgeon: [name], MD;  Location: [place];  Service: Orthopedics;  Laterality: Right;   NASAL SINUS SURGERY     x5   PARTIAL KNEE ARTHROPLASTY Right 06/01/2020   Procedure: Right knee patellofemoral replacement;  Surgeon: [name], MD;  Location: [place];  Service: Orthopedics;  Laterality: Right;   REMOVAL OF STONES  09/27/2018   Procedure: REMOVAL OF STONES;  Surgeon: [name], MD;  Location: [facility];  Service: Endoscopy;;   SPHINCTEROTOMY  09/27/2018   Procedure: SPHINCTEROTOMY;  Surgeon: [name], MD;  Location: [facility];  Service: Endoscopy;;   TOTAL HIP ARTHROPLASTY Left 03/12/2019   Procedure: LEFT TOTAL HIP ARTHROPLASTY ANTERIOR APPROACH;  [name], [name], MD)   UPPER  GASTROINTESTINAL ENDOSCOPY     Social History   Social History Narrative   Lives with husband and dog   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   Activity: tries to walk dog (45 min 3x/wk), yoga   Diet: good water, fruits/vegetables daily      Rheum: [name]   Psychiatrist: [facility]   Surgery: Redmond Pulling   [facility] Plan   Urology: Amalia Hailey   Immunization History  Administered Date(s) Administered   Influenza Split 05/04/2011, 04/04/2012   Influenza Whole 05/25/2007, 04/28/2008, 04/30/2009, 03/26/2010   Influenza,inj,Quad PF,6+ Mos 04/16/2015, 04/15/2016, 05/16/2017, 04/10/2018, 04/19/2019, 04/07/2020   Influenza-Unspecified 03/25/2014   MMR 11/28/2017   Moderna Sars-Covid-2 Vaccination 09/02/2019, 09/30/2019, 04/07/2020   Pneumococcal Conjugate-13 08/02/2013   Pneumococcal Polysaccharide-23 07/30/2009   Td 11/01/2005, 11/10/2016   Zoster Recombinat (Shingrix) 07/21/2017, 11/03/2017  Objective: Vital Signs: BP 131/84 (BP Location: Left Arm, Patient Position: Sitting, Cuff Size: Small)    Pulse 91    Resp 12    Ht 5' 4" (1.626 m)    Wt 130 lb 6.4 oz (59.1 kg)    LMP 07/25/1998    BMI 22.38 kg/m    Physical Exam Vitals and nursing note reviewed.  Constitutional:      Appearance: She is well-developed.  HENT:     Head: Normocephalic and atraumatic.  Eyes:     Conjunctiva/sclera: Conjunctivae normal.  Cardiovascular:     Rate and Rhythm: Normal rate and regular rhythm.     Heart sounds: Normal heart sounds.  Pulmonary:     Effort: Pulmonary effort is normal.     Breath sounds: Normal breath sounds.  Abdominal:     General: Bowel sounds are normal.     Palpations: Abdomen is soft.  Musculoskeletal:     Cervical back: Normal range of motion.  Skin:    General: Skin is warm and dry.     Capillary Refill: Capillary refill takes less than 2 seconds.     Comments: Sclerodactyly noted.  Nailbed capillary changes noted.  No digital ulcerations or  signs of gangrene.    Neurological:     Mental Status: She is alert and oriented to person, place, and time.  Psychiatric:        Behavior: Behavior normal.     Musculoskeletal Exam: C-spine limited ROM.  Thoracoulumar scolosis.  Some midline spinal tenderness noted.  Shoulder joints, elbow joints, wrist joints, MCPs, PIPs, and DIP good ROM with no synovitis.  PIP and DIP thickening consistent with osteoarthritis of both hands.  Complete fist formation bilaterally.  Right hip joint has slightly limited range of motion.  Left hip replacement has good range of motion with no discomfort.  Both knee joints have good range of motion with some discomfort in the right knee partial replacement.  Crepitus in the left knee.  Ankle joints have good ROM with no tenderness or joint swelling.  CDAI Exam: CDAI Score: -- Patient Global: --; Provider Global: -- Swollen: --; Tender: -- Joint Exam 07/29/2021   No joint exam has been documented for this visit   There is currently no information documented on the homunculus. Go to the Rheumatology activity and complete the homunculus joint exam.  Investigation: No additional findings.  Imaging: No results found.  Recent Labs: Lab Results  Component Value Date   WBC 7.0 04/06/2021   HGB 12.4 04/06/2021   PLT 298 04/06/2021   NA 135 07/09/2021   K 4.4 07/09/2021   CL 99 07/09/2021   CO2 30 07/09/2021   GLUCOSE 107 (H) 07/09/2021   BUN 9 07/09/2021   CREATININE 0.65 07/09/2021   BILITOT 0.6 07/09/2021   ALKPHOS 94 07/09/2021   AST 19 07/09/2021   ALT 12 07/09/2021   PROT 6.8 07/09/2021   ALBUMIN 4.5 07/09/2021   CALCIUM 9.7 07/09/2021   GFRAA >60 03/13/2019    Speciality Comments: No specialty comments available.  Procedures:  No procedures performed Allergies: Celebrex [celecoxib], Inh [isoniazid], Cymbalta [duloxetine hcl], Nucynta [tapentadol], Silenor [doxepin hcl], Benzoin, Cephalexin, Fentanyl, Codeine phosphate, Lithium, Meperidine  hcl, and Sulfamethoxazole   Assessment / Plan:     Visit Diagnoses: 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[place]) - [facility] on 03/26/20: indeterminate index 0.0, ANA positive, no titer, weak positive anti-histone, rest of ENA panel negative, family history of scleroderma:  Sclerodactyly evident but without any progression of  skin tightness on examination today.  She has had more frequent symptoms of Rachel Duran phenomenon with the cooler weather temperatures.  She remains on amlodipine 7.5 mg daily and uses nitroglycerin ointment as needed.  Discussed the importance of keeping her core body temperature warm as well as wearing heated gloves.  Patient was evaluated by Dr. Vaughan Browner on 05/05/2021.  Chest CT from 05/01/2021 was reviewed at the time which did not reveal any evidence of ILD. She was evaluated by Dr. Haroldine Laws on 06/23/2021.  His office visit note was reviewed today in the office.  High-resolution chest CT from November 2021 did not reveal any evidence of ILD.  PFTs were normal on 07/07/2020.  Echocardiogram on 06/22/2020 EF 55 to 60%.  No evidence of PAH.  Updated echocardiogram on 06/23/2021: EF 55%.  RV normal.  No evidence of PAH for RV strain.  Recommended continuing yearly screenings.  She has no shortness of breath, pleuritic chest pain, cough, or palpitations.  Her lungs were clear to auscultation on examination today and heart rate was regular rate and rhythm. She remains on Protonix for management of reflux and takes MiraLAX for chronic constipation.  She has not noticed any increased dysphagia. There were no signs of inflammatory arthritis.  No synovitis noted on examination today. She does not require immunosuppression at this time. [facility] labs will be updated prior to her next appointment for monitoring purposes.   She was advised to notify us if she develops any new or worsening symptoms.  She will follow up in 5 months.   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  She is taking amlodipine 7.39m by mouth daily and using nitroglycerin ointment as needed for symptomatic relief.  Discussed the use of battery-operated heated gloves as well as keeping her core body temperature warm.  She was advised to notify [place] she develops any new or worsening symptoms.  Primary osteoarthritis of both hands: She has PIP and DIP thickening consistent with osteoarthritis of both hands.  Discussed the importance of joint protection and muscle strengthening.  S/P hip replacement, left: She has good range of motion of the left hip replacement with no discomfort at this time.  Primary osteoarthritis of left knee: Good range of motion of the left knee joint with crepitus.  A refill of Voltaren gel was sent to the pharmacy today.  Status post right partial knee replacement - Patellofemoral replacement performed by Dr. DMarlou Saon 06/01/20.  Chronic pain.  She underwent right knee arthroscopic surgery with debridement on 04/06/2021 by Dr. DMarlou Sa  DDD (degenerative disc disease), cervical: Status post fusion by Dr. JArnoldo Morale  DDD (degenerative disc disease), lumbar: Previously followed by Dr. JArnoldo Morale  Other form of scoliosis of thoracolumbar spine: She has noticed progression in her scoliosis and has been having increased discomfort in the thoracic and lumbar region.  She has not gone to physical therapy in the past specifically for management of scoliosis.  A referral to the spine scoliosis center will be placed today for further evaluation and recommendations.  Fibromyalgia: She experiences intermittent myalgias and muscle tenderness due to fibromyalgia.  She remains on Lyrica 150 mg 1 capsule twice daily as prescribed.  Chronic pain syndrome -She continues to take Lyrica 150 mg 1 capsule twice daily.  Other fatigue: Chronic but stable overall.  Other medical conditions are listed as follows:  Hx of  porphyria  History of bipolar  disorder  CFS (chronic fatigue syndrome)  History of gastroesophageal reflux (GERD)  Prediabetes  Family history of scleroderma  Orders: No orders of the defined types were placed in this encounter.  Meds ordered this encounter  Medications   diclofenac Sodium (VOLTAREN) 1 % GEL    Sig: APPLY TWO TO FOUR GRAMS TO AFFECTED JOINT UP TO FOUR TIMES A DAY    Dispense:  400 g    Refill:  2     Follow-Up Instructions: Return in about 5 months (around 12/27/2021) for Limited systemic sclerosis, DDD, Osteoarthritis.   Ofilia Neas, PA-C  Note - This record has been created using Dragon software.  Chart creation errors have been sought, but may not always  have been located. Such creation errors do not reflect on  the standard of medical care.

## 2021-07-16 NOTE — Telephone Encounter (Signed)
Spoke with [facility] asking about meds.  Confirms both rxs were received and have been picked up.

## 2021-07-19 ENCOUNTER — Other Ambulatory Visit: Payer: Self-pay | Admitting: Physician Assistant

## 2021-07-20 NOTE — Telephone Encounter (Signed)
Next Visit: 07/29/2021   Last Visit: 02/23/2021   Last Fill: 03/02/2021   Dx:  [name] disease without gangrene   Current Dose per office note on 02/23/2021: amlodipine 7.5mg  by mouth daily.    Okay to refill Amlodipine?

## 2021-07-29 ENCOUNTER — Other Ambulatory Visit: Payer: Self-pay

## 2021-07-29 ENCOUNTER — Ambulatory Visit (INDEPENDENT_AMBULATORY_CARE_PROVIDER_SITE_OTHER): Payer: Medicare Other | Admitting: Physician Assistant

## 2021-07-29 ENCOUNTER — Encounter: Payer: Self-pay | Admitting: Physician Assistant

## 2021-07-29 VITALS — BP 131/84 | HR 91 | Resp 12 | Ht 64.0 in | Wt 130.4 lb

## 2021-07-29 DIAGNOSIS — Z96642 Presence of left artificial hip joint: Secondary | ICD-10-CM

## 2021-07-29 DIAGNOSIS — M5136 Other intervertebral disc degeneration, lumbar region: Secondary | ICD-10-CM

## 2021-07-29 DIAGNOSIS — M349 Systemic sclerosis, unspecified: Secondary | ICD-10-CM

## 2021-07-29 DIAGNOSIS — I7300 Raynaud's syndrome without gangrene: Secondary | ICD-10-CM | POA: Diagnosis not present

## 2021-07-29 DIAGNOSIS — Z8639 Personal history of other endocrine, nutritional and metabolic disease: Secondary | ICD-10-CM

## 2021-07-29 DIAGNOSIS — M797 Fibromyalgia: Secondary | ICD-10-CM

## 2021-07-29 DIAGNOSIS — M1712 Unilateral primary osteoarthritis, left knee: Secondary | ICD-10-CM

## 2021-07-29 DIAGNOSIS — M19042 Primary osteoarthritis, left hand: Secondary | ICD-10-CM

## 2021-07-29 DIAGNOSIS — M5030 Other cervical disc degeneration, unspecified cervical region: Secondary | ICD-10-CM

## 2021-07-29 DIAGNOSIS — Z8659 Personal history of other mental and behavioral disorders: Secondary | ICD-10-CM

## 2021-07-29 DIAGNOSIS — G894 Chronic pain syndrome: Secondary | ICD-10-CM

## 2021-07-29 DIAGNOSIS — G9332 Myalgic encephalomyelitis/chronic fatigue syndrome: Secondary | ICD-10-CM

## 2021-07-29 DIAGNOSIS — Z8269 Family history of other diseases of the musculoskeletal system and connective tissue: Secondary | ICD-10-CM

## 2021-07-29 DIAGNOSIS — Z8719 Personal history of other diseases of the digestive system: Secondary | ICD-10-CM

## 2021-07-29 DIAGNOSIS — R5383 Other fatigue: Secondary | ICD-10-CM

## 2021-07-29 DIAGNOSIS — M19041 Primary osteoarthritis, right hand: Secondary | ICD-10-CM

## 2021-07-29 DIAGNOSIS — M4185 Other forms of scoliosis, thoracolumbar region: Secondary | ICD-10-CM

## 2021-07-29 DIAGNOSIS — Z96651 Presence of right artificial knee joint: Secondary | ICD-10-CM

## 2021-07-29 DIAGNOSIS — M51369 Other intervertebral disc degeneration, lumbar region without mention of lumbar back pain or lower extremity pain: Secondary | ICD-10-CM

## 2021-07-29 DIAGNOSIS — R7303 Prediabetes: Secondary | ICD-10-CM

## 2021-07-29 MED ORDER — DICLOFENAC SODIUM 1 % EX GEL: CUTANEOUS | 2 refills | Status: DC

## 2021-07-29 NOTE — Addendum Note (Signed)
Addended by: Earnestine Mealing on: 07/29/2021 04:14 PM   Modules accepted: Orders

## 2021-08-09 ENCOUNTER — Telehealth: Payer: Self-pay | Admitting: Family Medicine

## 2021-08-16 ENCOUNTER — Other Ambulatory Visit: Payer: Medicare Other

## 2021-08-17 ENCOUNTER — Other Ambulatory Visit: Payer: Self-pay

## 2021-08-17 ENCOUNTER — Encounter: Payer: Self-pay | Admitting: Family

## 2021-08-17 ENCOUNTER — Ambulatory Visit (INDEPENDENT_AMBULATORY_CARE_PROVIDER_SITE_OTHER): Payer: Medicare Other | Admitting: Family

## 2021-08-17 VITALS — BP 126/78 | HR 90 | Temp 97.4°F | Resp 98 | Ht 64.0 in | Wt 134.0 lb

## 2021-08-17 DIAGNOSIS — J029 Acute pharyngitis, unspecified: Secondary | ICD-10-CM | POA: Diagnosis not present

## 2021-08-17 DIAGNOSIS — R051 Acute cough: Secondary | ICD-10-CM | POA: Diagnosis not present

## 2021-08-17 DIAGNOSIS — J020 Streptococcal pharyngitis: Secondary | ICD-10-CM

## 2021-08-17 LAB — POCT RAPID STREP A (OFFICE): Rapid Strep A Screen: POSITIVE — AB

## 2021-08-17 MED ORDER — AMOXICILLIN-POT CLAVULANATE 875-125 MG PO TABS: 1.0000 | ORAL_TABLET | Freq: Two times a day (BID) | ORAL | 0 refills | Status: AC

## 2021-08-17 NOTE — Patient Instructions (Addendum)
Warm salt water gargles as needed for sore throat.   Strep test was positive today, I have sent antbx to your pharmacy to start taking.  Change your tooth brush after 24 hours on antibiotic as well.  Antibiotic sent to preferred pharmacy.   Please follow up if no improvement in 2-3 days.   It was a pleasure seeing you today! Please do not hesitate to reach out with any questions and or concerns.  Regards,   Eugenia Pancoast   It was a pleasure seeing you today! Please do not hesitate to reach out with any questions and or concerns.  Regards,   Eugenia Pancoast FNP-C

## 2021-08-17 NOTE — Progress Notes (Signed)
Pt made aware in the office and was given augmentin RX.

## 2021-08-17 NOTE — Progress Notes (Signed)
Established Patient Office Visit  Subjective:  Patient ID: Rachel Duran, female    DOB: 08-11-1955  Age: 66 y.o. MRN: [id number]  CC:  Chief Complaint  Patient presents with   Sore Throat   Cough   covid negative    HPI Rachel Duran is here today with concerns.   Covid tested today this am for covid and was negative.   Pt started with symptoms for the last six days.  Does have scleroderma and raynauds so immunocompromised.  Does c/o sore throat which she describes as severe, bil ear pain, nasal congestion, runny nose, chest congestion (only slight) with yellow sputum. Otherwise dry hacking cough. Still with mild headache/body ache. Husband has been sick over two weeks with similar symptoms.   Taking aspirin  for headaches/body aches.   Past Medical History:  Diagnosis Date   Abdominal pain last 4 months   and nausea also   Allergy    Anemia    history of   Anxiety    Bipolar disorder (HCC)    atpical bipolar disorder   Cervical disc disease limited rom turning to left   hx. C6- C7 -hx. past fusion(bone graft used)   Cholecystitis    Chronic pain    DDD (degenerative disc disease), lumbar 09/2015   dextroscoliosis with multilevel DDD and facet arthrosis most notable for R foraminal disc protrusion L4/5 producing severe R neural foraminal stenosis abutting R L4 nerve root, moderate spinal canal and mild lat recesss and R neural foraminal stenosis L3/4 (MRI)   Depression    bipolar depression   Disorders of porphyrin metabolism    Felon of finger of left hand 11/10/2016   Fibromyalgia    GERD (gastroesophageal reflux disease)    Headache    occasionally   Internal hemorrhoids    Interstitial cystitis 06/06/2012   hx.   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 (Dr Baird Cancer retinologist)    Past Surgical History:  Procedure Laterality Date   ANTERIOR CERVICAL DECOMP/DISCECTOMY FUSION  2004   C5/6, C6/7   ANTERIOR CERVICAL DECOMP/DISCECTOMY FUSION  02/2016   C3/4, C4/5 with plating [name])   AUGMENTATION MAMMAPLASTY Bilateral 03/25/2010   BREAST ENHANCEMENT SURGERY  2010   BREAST IMPLANT EXCHANGE  10/2014   exchange saline implants, B mastopexy/capsulorraphy ([facility])   BUNIONECTOMY Bilateral yrs ago   [place]   x 1   CHOLECYSTECTOMY  06/11/2012   Procedure: LAPAROSCOPIC CHOLECYSTECTOMY WITH INTRAOPERATIVE CHOLANGIOGRAM;  Surgeon: [name], MD,FACS;  Location: [facility];  Service: General;  Laterality: N/A;   COLONOSCOPY  02/2018   done for positive cologuard - WNL, rpt 10 yrs [facility] Plan)   CYSTOSCOPY     ERCP  05/22/2012   Procedure: ENDOSCOPIC RETROGRADE CHOLANGIOPANCREATOGRAPHY (ERCP);  Surgeon: [name], MD,FACG;  Location: [facility];  Service: Endoscopy;  Laterality: N/A;   ERCP N/A 09/17/2013   Procedure: ENDOSCOPIC RETROGRADE CHOLANGIOPANCREATOGRAPHY (ERCP);  Surgeon: [name], MD;  Location: [facility];  Service: Endoscopy;  Laterality: N/A;   ERCP N/A 09/27/2018   Procedure: ENDOSCOPIC RETROGRADE CHOLANGIOPANCREATOGRAPHY (ERCP);  Surgeon: [name], MD;  Location: [facility];  Service: Endoscopy;  Laterality: N/A;   ESOPHAGOGASTRODUODENOSCOPY  09/2016   WNL. esophagus dilated [facility] Plan)   HEMORRHOID BANDING  09-23-13   --Dr. Greer Pickerel   HERNIA REPAIR     inguinal   HYSTEROSCOPY W/ ENDOMETRIAL ABLATION     KNEE ARTHROSCOPY Right 04/06/2021   Procedure: RIGHT KNEE ARTHROSCOPY WITH DEBRIDEMENT;  Surgeon: [name], MD;  Location: [facility];   Service: Orthopedics;  Laterality: Right;   NASAL SINUS SURGERY     x5   PARTIAL KNEE ARTHROPLASTY Right 06/01/2020   Procedure: Right knee patellofemoral replacement;  Surgeon: [name], MD;  Location: [place];  Service: Orthopedics;  Laterality: Right;   REMOVAL OF STONES  09/27/2018   Procedure: REMOVAL OF STONES;  Surgeon: [name], MD;  Location: [facility];  Service: Endoscopy;;   SPHINCTEROTOMY  09/27/2018   Procedure: SPHINCTEROTOMY;  Surgeon: [name], MD;  Location: [facility];  Service: Endoscopy;;   TOTAL HIP ARTHROPLASTY Left 03/12/2019   Procedure: LEFT TOTAL HIP ARTHROPLASTY ANTERIOR APPROACH;  [name], [name], MD)   UPPER GASTROINTESTINAL ENDOSCOPY      Family History  Problem Relation Age of Onset   CAD Father 30       MI, nonsmoker   Esophageal cancer Father 56   Stomach cancer Father    Scleroderma Mother    Hypertension Mother    Esophageal cancer Paternal Grandfather    Stomach cancer Paternal Grandfather    Diabetes Maternal Grandmother    Arthritis Brother    Stroke Neg Hx    Colon cancer Neg Hx    Rectal cancer Neg Hx     Social History   Socioeconomic History   Marital status: Married    Spouse name: Not on file   Number of children: Not on file   Years of education: Not on file   Highest education level: Not on file  Occupational History   Occupation: retired    Fish farm manager: UNEMPLOYED  Tobacco Use   Smoking status: Never   Smokeless tobacco: Never  Vaping Use   Vaping Use: Never used  Substance and Sexual Activity   Alcohol use: No    Alcohol/week: 0.0 standard drinks   Drug use: No   Sexual activity: Yes    Partners: Male    Birth control/protection: Post-menopausal    Comment: vasectomy  Other Topics Concern   Not on file  Social History Narrative   Lives with husband and dog   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   Activity: tries to walk dog  (45 min 3x/wk), yoga   Diet: good water, fruits/vegetables daily      Rheum: [name]   Psychiatrist: Chief Executive Officer   Surgery: Redmond Pulling   [facility] Plan   Urology: Amalia Hailey   Social Determinants of Health   Financial Resource Strain: Not on file  Food Insecurity: Not on file  Transportation Needs: Not on file  Physical Activity: Not on file  Stress: Not on file  Social Connections: Not on file  Intimate Partner Violence: Not on file    Outpatient Medications Prior to Visit  Medication Sig Dispense Refill   ALPRAZolam (XANAX) 1 MG tablet Take 0.5 mg by mouth at bedtime.  0   amLODipine (NORVASC) 2.5 MG tablet TAKE 1 TABLET  BY MOUTH DAILY WITH AMLODIPINE 5 MG TABLET. (7.5 MG TOTAL DAILY. 90 tablet 0   amLODipine (NORVASC) 5 MG tablet TAKE 1 AND 1/2 TABLETS BY MOUTH DAILY 45 tablet 2   amoxicillin (AMOXIL) 500 MG tablet Take 4 tablets (2,095m) by mouth 30-60 minutes prior to dental cleaning. 4 tablet 0   amphetamine-dextroamphetamine (ADDERALL XR) 30 MG 24 hr capsule TAKE 1 CAPSULE BY MOUTH EACH MORNING 90 capsule 0   ARIPiprazole (ABILIFY) 5 MG tablet Take 5 mg by mouth daily.     aspirin 81 MG chewable tablet Chew 1 tablet (81 mg total) by mouth daily. 30 tablet 0   ciclopirox (PENLAC) 8 % solution 1 application to affected area     cyanocobalamin (,VITAMIN B-12,) 1000 MCG/ML injection INJECT 1ML INTO THE MUSCLE EVERY 21 DAYSAS DIRECTED 3 mL 3   diclofenac Sodium (VOLTAREN) 1 % GEL APPLY TWO TO FOUR GRAMS TO AFFECTED JOINT UP TO FOUR TIMES A DAY 400 g 2   Eszopiclone 3 MG TABS TAKE 1 TABLET BY MOUTH AT BEDTIME TAKE IMMEDIATLEY BEFORE BEDTIME AS DIRECTED. 90 tablet 0   ferrous sulfate 325 (65 FE) MG tablet Take 325 mg by mouth every Monday, Wednesday, and Friday.      fluticasone (FLONASE) 50 MCG/ACT nasal spray Place 1 spray into both nostrils daily as needed for allergies. 16 g 5   lamoTRIgine (LAMICTAL) 100 MG tablet Take 100 mg by mouth daily.     linaclotide (LINZESS) 290 MCG CAPS capsule Take  1 capsule (290 mcg total) by mouth daily. 90 capsule 1   morphine (MSIR) 15 MG tablet Take 1 tablet (15 mg total) by mouth 3 (three) times daily as needed for severe pain or moderate pain. 90 tablet 0   MOVANTIK 25 MG TABS tablet Take 1 tablet (25 mg total) by mouth daily. 90 tablet 1   Multiple Vitamins-Minerals (PRESERVISION AREDS 2 PO) Take by mouth.     nitroGLYCERIN (NITROGLYN) 2 % OINT ointment Apply 1 application topically in the morning, at noon, and at bedtime. 1 application= 1/4 inch to fingertips. 60 g 0   NONFORMULARY OR COMPOUNDED ITEM Testosterone propionate 2% in white petrolatum, apply small amount once daily for 5 days per week. 60 each 0   ondansetron (ZOFRAN) 4 MG tablet TAKE ONE TABLET EVERY EIGHT HOURS AS NEEDED FOR NAUSEA / VOMITING 30 tablet 0   ondansetron (ZOFRAN-ODT) 4 MG disintegrating tablet TAKE 1 TABLET BY MOUTH EVERY 8 HOURS AS NEEDED FOR NAUSEA OR VOMITING 20 tablet 1   pantoprazole (PROTONIX) 40 MG tablet TAKE 1 TABLET BY MOUTH TWICE DAILY WITH A MEAL. 180 tablet 1   pentosan polysulfate (ELMIRON) 100 MG capsule Take 200 mg by mouth 2 (two) times daily.      phenazopyridine (PYRIDIUM) 100 MG tablet Take by mouth as needed.      polyethylene glycol (MIRALAX / GLYCOLAX) 17 g packet Take 17 g by mouth 3 (three) times daily.     Prasterone (INTRAROSA) 6.5 MG INST Place 1 suppository vaginally at bedtime. 84 each 3   pregabalin (LYRICA) 150 MG capsule TAKE 1 CAPSULE BY MOUTH TWICE DAILY 180 capsule 0   PREVIDENT 5000 BOOSTER PLUS 1.1 % PSTE See admin instructions.     senna (SENOKOT) 8.6 MG tablet Take 2 tablets by mouth daily as needed.      UNABLE TO FIND Med Name: Vit D and Calcium powder. Mix in 8 oz of water qd     ursodiol (ACTIGALL) 300  MG capsule TAKE 1 CAPSULE BY MOUTH 2 TIMES DAILY. 180 capsule 3   doxycycline (VIBRA-TABS) 100 MG tablet Take 1 tablet (100 mg total) by mouth 2 (two) times daily. (Patient not taking: Reported on 07/29/2021) 14 tablet 0    tiZANidine (ZANAFLEX) 2 MG tablet TAKE 1 TABLET BY MOUTH EVERY 8 HOURS AS NEEDED 30 tablet 0   triamcinolone ointment (KENALOG) 0.1 % Apply 1 application topically 2 (two) times daily. As needed (Patient not taking: Reported on 07/29/2021)     No facility-administered medications prior to visit.    Allergies  Allergen Reactions   Celebrex [Celecoxib] Rash    Headaches   Inh [Isoniazid] Other (See Comments)    Hepatitis   Cymbalta [Duloxetine Hcl] Other (See Comments)    Urinary retention   Nucynta [Tapentadol] Other (See Comments)    Agitation, jerking in legs   Silenor [Doxepin Hcl] Other (See Comments)    nightmare, dizziness, stumbling upon walking, not effective   Benzoin Dermatitis   Cephalexin Nausea And Vomiting   Fentanyl Other (See Comments)    Cold intolerance, arms twitching, insomnia, fatigue   Codeine Phosphate Nausea And Vomiting and Other (See Comments)    Severe n&v   Lithium Other (See Comments)    Sores in mouth    Meperidine Hcl Rash and Other (See Comments)    REACTION: red skin   Sulfamethoxazole Rash    ROS Review of Systems  Constitutional:  Negative for chills and fever.  HENT:  Positive for ear pain (bil), postnasal drip, sinus pressure and sore throat. Negative for congestion.   Respiratory:  Positive for cough (dry hacking with mild yellow sputum). Negative for shortness of breath (, some chest congestion) and wheezing.   Cardiovascular:  Negative for chest pain and palpitations.     Objective:    Physical Exam Vitals reviewed.  Constitutional:      General: She is not in acute distress.    Appearance: She is well-developed and normal weight. She is not ill-appearing, toxic-appearing or diaphoretic.  HENT:     Head: Normocephalic.     Right Ear: Tympanic membrane normal.     Left Ear: Tympanic membrane normal.     Mouth/Throat:     Mouth: Mucous membranes are moist.     Pharynx: Posterior oropharyngeal erythema present.     Tonsils: No  tonsillar exudate.  Eyes:     Conjunctiva/sclera: Conjunctivae normal.  Cardiovascular:     Rate and Rhythm: Normal rate and regular rhythm.  Pulmonary:     Effort: Pulmonary effort is normal.     Breath sounds: Normal breath sounds.  Lymphadenopathy:     Cervical: Cervical adenopathy present.     Right cervical: Superficial cervical adenopathy present.     Left cervical: Superficial cervical adenopathy present.  Neurological:     Mental Status: She is alert.    BP 126/78    Pulse 90    Temp (!) 97.4 F (36.3 C) (Temporal)    Resp (!) 98    Ht _0  (1.626 m)    Wt 134 lb (60.8 kg)    LMP 07/25/1998    BMI 23.00 kg/m  Wt Readings from Last 3 Encounters:  08/17/21 134 lb (60.8 kg)  07/29/21 130 lb 6.4 oz (59.1 kg)  07/14/21 127 lb (57.6 kg)     Health Maintenance Due  Topic Date Due   COVID-19 Vaccine (4 - Booster for Moderna series) 06/02/2020    There are no  preventive care reminders to display for this patient.  Lab Results  Component Value Date   TSH 2.28 07/09/2021   Lab Results  Component Value Date   WBC 7.0 04/06/2021   HGB 12.4 04/06/2021   HCT 38.2 04/06/2021   MCV 86.0 04/06/2021   PLT 298 04/06/2021   Lab Results  Component Value Date   NA 135 07/09/2021   K 4.4 07/09/2021   CO2 30 07/09/2021   GLUCOSE 107 (H) 07/09/2021   BUN 9 07/09/2021   CREATININE 0.65 07/09/2021   BILITOT 0.6 07/09/2021   ALKPHOS 94 07/09/2021   AST 19 07/09/2021   ALT 12 07/09/2021   PROT 6.8 07/09/2021   ALBUMIN 4.5 07/09/2021   CALCIUM 9.7 07/09/2021   ANIONGAP 7 03/13/2019   EGFR 99 02/23/2021   GFR 93.68 07/09/2021   Lab Results  Component Value Date   HGBA1C 5.9 07/09/2021      Assessment & Plan:   Problem List Items Addressed This Visit       Respiratory   Strep pharyngitis    augmentin sent to pharmacy for positive strep, Take antibiotic as prescribed. Increase oral fluids. Pt to f/u if sx worsen and or fail to improve in 2-3 days. Change toothbrush  after 24 hours.        Relevant Medications   ciclopirox (PENLAC) 8 % solution     Other   Sore throat    Rapid strep test today in office was positive. Warm salt water gargles prn      Relevant Medications   amoxicillin-clavulanate (AUGMENTIN) 875-125 MG tablet   Other Relevant Orders   POCT rapid strep A (Completed)   RESOLVED: Acute cough - Primary    Meds ordered this encounter  Medications   amoxicillin-clavulanate (AUGMENTIN) 875-125 MG tablet    Sig: Take 1 tablet by mouth 2 (two) times daily for 10 days.    Dispense:  20 tablet    Refill:  0    Order Specific Question:   Supervising Provider    Answer:   BEDSOLE, AMY E [[id number]]    Follow-up: Return if symptoms worsen or fail to improve.    Eugenia Pancoast, FNP

## 2021-08-17 NOTE — Assessment & Plan Note (Addendum)
Rapid strep test today in office was positive. Warm salt water gargles prn

## 2021-08-18 DIAGNOSIS — J020 Streptococcal pharyngitis: Secondary | ICD-10-CM | POA: Insufficient documentation

## 2021-08-18 NOTE — Assessment & Plan Note (Signed)
augmentin sent to pharmacy for positive strep, Take antibiotic as prescribed. Increase oral fluids. Pt to f/u if sx worsen and or fail to improve in 2-3 days. Change toothbrush after 24 hours.

## 2021-08-23 ENCOUNTER — Other Ambulatory Visit: Payer: Self-pay | Admitting: Family Medicine

## 2021-08-23 NOTE — Telephone Encounter (Signed)
Refill request Zofran Last refill 02/10/21 #30 Last office visit 08/17/21 acute

## 2021-08-27 ENCOUNTER — Encounter: Payer: Self-pay | Admitting: Family Medicine

## 2021-08-30 ENCOUNTER — Other Ambulatory Visit: Payer: Self-pay | Admitting: Family Medicine

## 2021-08-30 MED ORDER — MORPHINE SULFATE 15 MG PO TABS: 15.0000 mg | ORAL_TABLET | Freq: Three times a day (TID) | ORAL | 0 refills | Status: DC | PRN

## 2021-08-30 MED ORDER — AMLODIPINE BESYLATE 5 MG PO TABS: 5.0000 mg | ORAL_TABLET | Freq: Every day | ORAL | 0 refills | Status: DC

## 2021-08-30 MED ORDER — AMLODIPINE BESYLATE 2.5 MG PO TABS: ORAL_TABLET | ORAL | 0 refills | Status: DC

## 2021-08-30 NOTE — Telephone Encounter (Signed)
Next Visit: 12/28/2021  Last Visit: 07/29/2021  Last Fill: 06/08/2021  Dx: [name] disease without gangrene   Current Dose per office note on 07/29/2021: amlodipine 7.5mg  by mouth daily  Okay to refill Amlodipine?

## 2021-08-30 NOTE — Telephone Encounter (Signed)
Name of Medication: Eszopiclone Name of Pharmacy: [facility] or Written Date and Quantity: 06/2221, #90 Last Office Visit and Type: 08/16/21-acute Next Office Visit and Type: 09/21/21 Last Controlled Substance Agreement Date: 04/19/19 Last UDS: 04/19/19

## 2021-09-01 NOTE — Telephone Encounter (Signed)
Name of Medication: Adderall XR Name of Pharmacy: [facility] or Written Date and Quantity: 06/04/21, #90 Last Office Visit and Type: 07/14/21, pain mgmt f/u Next Office Visit and Type: 09/21/21, AWV prt 2 Last Controlled Substance Agreement Date: 03/30/19 Last UDS: 03/30/19

## 2021-09-02 NOTE — Telephone Encounter (Signed)
ERx 

## 2021-09-07 ENCOUNTER — Other Ambulatory Visit: Payer: Self-pay | Admitting: Gastroenterology

## 2021-09-13 ENCOUNTER — Encounter: Payer: Self-pay | Admitting: Family Medicine

## 2021-09-13 NOTE — Telephone Encounter (Signed)
Pt called she needs [facility]

## 2021-09-13 NOTE — Addendum Note (Signed)
Addended by: Brenton Grills on: 2/70/3500 93:81 PM   Modules accepted: Orders

## 2021-09-14 MED ORDER — AMPHETAMINE-DEXTROAMPHET ER 30 MG PO CP24: ORAL_CAPSULE | ORAL | 0 refills | Status: DC

## 2021-09-14 NOTE — Telephone Encounter (Signed)
Plz address in Dr. Synthia Innocent absence.  (See Pt Msg, 09/13/21)

## 2021-09-14 NOTE — Telephone Encounter (Signed)
I sent it to mail order

## 2021-09-14 NOTE — Addendum Note (Signed)
Addended by: Loura Pardon A on: 09/14/2021 12:43 PM   Modules accepted: Orders

## 2021-09-14 NOTE — Telephone Encounter (Signed)
See Refill note, 08/30/21.

## 2021-09-15 ENCOUNTER — Ambulatory Visit: Payer: Medicare Other | Admitting: Family Medicine

## 2021-09-16 ENCOUNTER — Encounter: Payer: Self-pay | Admitting: Family Medicine

## 2021-09-17 ENCOUNTER — Ambulatory Visit (INDEPENDENT_AMBULATORY_CARE_PROVIDER_SITE_OTHER)
Admission: RE | Admit: 2021-09-17 | Discharge: 2021-09-17 | Disposition: A | Discharge status: Home / Self Care | Payer: Self-pay | Source: Ambulatory Visit | Attending: Internal Medicine | Admitting: Internal Medicine

## 2021-09-17 ENCOUNTER — Other Ambulatory Visit: Payer: Self-pay

## 2021-09-17 DIAGNOSIS — I2510 Atherosclerotic heart disease of native coronary artery without angina pectoris: Secondary | ICD-10-CM

## 2021-09-17 DIAGNOSIS — E785 Hyperlipidemia, unspecified: Secondary | ICD-10-CM

## 2021-09-21 ENCOUNTER — Other Ambulatory Visit: Payer: Self-pay | Admitting: Family Medicine

## 2021-09-21 ENCOUNTER — Encounter: Payer: Self-pay | Admitting: Family Medicine

## 2021-09-21 ENCOUNTER — Ambulatory Visit (INDEPENDENT_AMBULATORY_CARE_PROVIDER_SITE_OTHER): Payer: Medicare Other | Admitting: Family Medicine

## 2021-09-21 ENCOUNTER — Other Ambulatory Visit: Payer: Self-pay

## 2021-09-21 VITALS — BP 140/78 | HR 73 | Temp 97.9°F | Ht 63.0 in | Wt 131.5 lb

## 2021-09-21 DIAGNOSIS — M5010 Cervical disc disorder with radiculopathy, unspecified cervical region: Secondary | ICD-10-CM

## 2021-09-21 DIAGNOSIS — R7303 Prediabetes: Secondary | ICD-10-CM

## 2021-09-21 DIAGNOSIS — M419 Scoliosis, unspecified: Secondary | ICD-10-CM

## 2021-09-21 DIAGNOSIS — Z0000 Encounter for general adult medical examination without abnormal findings: Secondary | ICD-10-CM

## 2021-09-21 DIAGNOSIS — M797 Fibromyalgia: Secondary | ICD-10-CM

## 2021-09-21 DIAGNOSIS — K5903 Drug induced constipation: Secondary | ICD-10-CM

## 2021-09-21 DIAGNOSIS — N952 Postmenopausal atrophic vaginitis: Secondary | ICD-10-CM

## 2021-09-21 DIAGNOSIS — M5136 Other intervertebral disc degeneration, lumbar region: Secondary | ICD-10-CM

## 2021-09-21 DIAGNOSIS — E611 Iron deficiency: Secondary | ICD-10-CM

## 2021-09-21 DIAGNOSIS — F5104 Psychophysiologic insomnia: Secondary | ICD-10-CM

## 2021-09-21 DIAGNOSIS — M349 Systemic sclerosis, unspecified: Secondary | ICD-10-CM

## 2021-09-21 DIAGNOSIS — I7300 Raynaud's syndrome without gangrene: Secondary | ICD-10-CM

## 2021-09-21 DIAGNOSIS — K219 Gastro-esophageal reflux disease without esophagitis: Secondary | ICD-10-CM

## 2021-09-21 DIAGNOSIS — E785 Hyperlipidemia, unspecified: Secondary | ICD-10-CM

## 2021-09-21 DIAGNOSIS — Z7189 Other specified counseling: Secondary | ICD-10-CM

## 2021-09-21 DIAGNOSIS — R911 Solitary pulmonary nodule: Secondary | ICD-10-CM

## 2021-09-21 DIAGNOSIS — M5030 Other cervical disc degeneration, unspecified cervical region: Secondary | ICD-10-CM

## 2021-09-21 DIAGNOSIS — K831 Obstruction of bile duct: Secondary | ICD-10-CM

## 2021-09-21 DIAGNOSIS — F331 Major depressive disorder, recurrent, moderate: Secondary | ICD-10-CM

## 2021-09-21 DIAGNOSIS — Z96642 Presence of left artificial hip joint: Secondary | ICD-10-CM

## 2021-09-21 DIAGNOSIS — H3553 Other dystrophies primarily involving the sensory retina: Secondary | ICD-10-CM

## 2021-09-21 DIAGNOSIS — N3010 Interstitial cystitis (chronic) without hematuria: Secondary | ICD-10-CM

## 2021-09-21 DIAGNOSIS — M85851 Other specified disorders of bone density and structure, right thigh: Secondary | ICD-10-CM

## 2021-09-21 DIAGNOSIS — G894 Chronic pain syndrome: Secondary | ICD-10-CM

## 2021-09-21 NOTE — Telephone Encounter (Signed)
Refill request Linzess Last refill 05/19/21 #90/1 Last office visit today 09/21/21

## 2021-09-21 NOTE — Progress Notes (Signed)
Patient ID: Rachel Duran, female    DOB: [birth year], 66 y.o.   MRN: [id number]  This visit was conducted in person.  BP 140/78    Pulse 73    Temp 97.9 F (36.6 C) (Temporal)    Ht 5\' 3"  (1.6 m)    Wt 131 lb 8 oz (59.6 kg)    LMP 07/25/1998    SpO2 97%    BMI 23.29 kg/m   BP Readings from Last 3 Encounters:  09/21/21 140/78  08/17/21 126/78  07/29/21 131/84   CC: AMW/CPE Subjective:   HPI: Rachel Duran is a 66 y.o. female presenting on 09/21/2021 for Medicare Wellness   Did not see health advisor this year  Hearing Screening   500Hz  1000Hz  2000Hz  4000Hz   Right ear 20 20 20 20   Left ear 20 20 20 20   Vision Screening - Comments:: Last eye exam, 06/2021.  [place] Visit from 09/21/2021 in [place] [facility] [place]  PHQ-2 Total Score 0       Fall Risk  09/21/2021 07/10/2020 07/02/2019 06/28/2018 06/21/2017  Falls in the past year? 0 0 1 1 No  Comment - - tripped and fell fell walking down stairs -  Number falls in past yr: - - 0 0 -  Injury with Fall? - - 1 1 -  Comment - - hurt her hip bruising to hip region -  Risk for fall due to : - - Medication side effect - -  Follow up - - Falls evaluation completed;Falls prevention discussed - -   Seen last month with strep throat treated with augmentin.   National Adderall back order - previously on Adderall ER 30mg  daily. Got 30d supply of brand Adderall. Also having issues getting linzess due to shortage - this was recently filled.   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    S/p partial R knee replacement with PF arthroplasty (Dr Marlou Sa) 06/01/2020 then rpt arthroscopy with debridement of inferior patellar pole spur 03/2021 - she is able to walk several times a week. Continues to struggle with stairs.    Limited systemic sclerosis - sees rheum regularly. Saw pulm and cards with reassuring evaluations.     Preventative: COLONOSCOPY 02/2018 - done for positive cologuard - WNL, rpt 10 yrs [facility]).  Well woman yearly with Dr Josefa Half OBGYN 04/2021 with normal pap smear.   [facility] - 2014 with mild osteopenia per GYN DEXA - 04/2019 stable osteopenia (T -2.0). upcoming rpt 09/2021.  Doing ok with calcium and vitamin D intake, limited weight bearing exercises.  Lung cancer screening - not eligible Flu shot - yearly COVID vaccine Moderna 08/2019, 09/2019, booster 03/2020 may get bivalent   Pneumovax 2011, prevnar-13 2015 Td 2007, Td 10/2016 Shingrix 06/2017, 10/2017  Advanced care planning - HCPOA scanned into chart 06/2015. HCPOA is husband Areya Lemmerman. Does not want prolonged life support if terminally ill.  Seat belt use discussed.  Sunscreen use discussed. No changing moles. Sees derm yearly Delman Cheadle).  Non smoker  Alcohol - none  Dentist q6 mo  Eye exam frequently Bowel - chronic constipation - on movantik with good effect -25mg  daily (12.5mg  not effective).  Bladder - no incontinence, known IC yearly followed by Dr Amalia Hailey doing well on elmiron and PRN pyridium.    Lives with husband and dog Occupation: on disability since 2004, prior worked for [employer]  office Activity: tries to walk dog (45 min 3x/wk), yoga Diet: good water, fruits/vegetables daily     Relevant past medical, surgical, family and social history reviewed and updated as indicated. Interim medical history since our last visit reviewed. Allergies and medications reviewed and updated. Outpatient Medications Prior to Visit  Medication Sig Dispense Refill   ALPRAZolam (XANAX) 1 MG tablet Take 0.5 mg by mouth at bedtime.  0   amLODipine (NORVASC) 2.5 MG tablet TAKE 1 TABLET BY MOUTH DAILY WITH AMLODIPINE 5 MG TABLET. (7.5 MG TOTAL DAILY. 90 tablet 0   amLODipine (NORVASC) 5 MG tablet Take 1 tablet (5 mg total) by mouth daily. 90 tablet 0   amoxicillin (AMOXIL) 500 MG tablet Take 4 tablets  (2,000mg ) by mouth 30-60 minutes prior to dental cleaning. 4 tablet 0   amphetamine-dextroamphetamine (ADDERALL XR) 30 MG 24 hr capsule TAKE 1 CAPSULE BY MOUTH EACH MORNING 90 capsule 0   ARIPiprazole (ABILIFY) 5 MG tablet Take 5 mg by mouth daily.     aspirin 81 MG chewable tablet Chew 1 tablet (81 mg total) by mouth daily. 30 tablet 0   ciclopirox (PENLAC) 8 % solution 1 application to affected area     cyanocobalamin (,VITAMIN B-12,) 1000 MCG/ML injection INJECT 1ML INTO THE MUSCLE EVERY 21 DAYSAS DIRECTED 3 mL 3   diclofenac Sodium (VOLTAREN) 1 % GEL APPLY TWO TO FOUR GRAMS TO AFFECTED JOINT UP TO FOUR TIMES A DAY 400 g 2   Eszopiclone 3 MG TABS TAKE 1 TABLET BY MOUTH AT BEDTIME TAKE IMMEDIATLEY BEFORE BEDTIME AS DIRECTED. 90 tablet 0   ferrous sulfate 325 (65 FE) MG tablet Take 325 mg by mouth every Monday, Wednesday, and Friday.      fluticasone (FLONASE) 50 MCG/ACT nasal spray Place 1 spray into both nostrils daily as needed for allergies. 16 g 5   lamoTRIgine (LAMICTAL) 100 MG tablet Take 100 mg by mouth daily.     morphine (MSIR) 15 MG tablet Take 1 tablet (15 mg total) by mouth 3 (three) times daily as needed for severe pain or moderate pain. 90 tablet 0   MOVANTIK 25 MG TABS tablet Take 1 tablet (25 mg total) by mouth daily. 90 tablet 1   Multiple Vitamins-Minerals (PRESERVISION AREDS 2 PO) Take by mouth.     nitroGLYCERIN (NITROGLYN) 2 % OINT ointment Apply 1 application topically in the morning, at noon, and at bedtime. 1 application= 1/4 inch to fingertips. 60 g 0   NONFORMULARY OR COMPOUNDED ITEM Testosterone propionate 2% in white petrolatum, apply small amount once daily for 5 days per week. 60 each 0   ondansetron (ZOFRAN) 4 MG tablet TAKE ONE TABLET EVERY EIGHT HOURS AS NEEDED FOR NAUSEA / VOMITING 30 tablet 0   ondansetron (ZOFRAN-ODT) 4 MG disintegrating tablet TAKE 1 TABLET BY MOUTH EVERY 8 HOURS AS NEEDED FOR NAUSEA OR VOMITING 20 tablet 1   pantoprazole (PROTONIX) 40 MG  tablet TAKE ONE TABLET TWICE A DAY WITH MEALS 180 tablet 0   pentosan polysulfate (ELMIRON) 100 MG capsule Take 200 mg by mouth 2 (two) times daily.      phenazopyridine (PYRIDIUM) 100 MG tablet Take by mouth as needed.      polyethylene glycol (MIRALAX / GLYCOLAX) 17 g packet Take 17 g by mouth 3 (three) times daily.     Prasterone (INTRAROSA) 6.5 MG INST Place 1 suppository vaginally at bedtime. 84 each 3   pregabalin (LYRICA) 150 MG capsule TAKE 1 CAPSULE BY MOUTH TWICE  DAILY 180 capsule 0   PREVIDENT 5000 BOOSTER PLUS 1.1 % PSTE See admin instructions.     senna (SENOKOT) 8.6 MG tablet Take 2 tablets by mouth daily as needed.      UNABLE TO FIND Med Name: Vit D and Calcium powder. Mix in 8 oz of water qd     ursodiol (ACTIGALL) 300 MG capsule TAKE 1 CAPSULE BY MOUTH 2 TIMES DAILY. 180 capsule 3   linaclotide (LINZESS) 290 MCG CAPS capsule Take 1 capsule (290 mcg total) by mouth daily. 90 capsule 1   No facility-administered medications prior to visit.     Per HPI unless specifically indicated in ROS section below Review of Systems  Constitutional:  Negative for activity change, appetite change, chills, fatigue, fever and unexpected weight change.  HENT:  Negative for hearing loss.   Eyes:  Negative for visual disturbance.  Respiratory:  Negative for cough, chest tightness, shortness of breath and wheezing.   Cardiovascular:  Negative for chest pain, palpitations and leg swelling.  Gastrointestinal:  Positive for constipation and nausea. Negative for abdominal distention, abdominal pain, blood in stool, diarrhea and vomiting.  Genitourinary:  Negative for difficulty urinating and hematuria.  Musculoskeletal:  Negative for arthralgias, myalgias and neck pain.  Skin:  Negative for rash.  Neurological:  Negative for dizziness, seizures, syncope and headaches.  Hematological:  Negative for adenopathy. Does not bruise/bleed easily.  Psychiatric/Behavioral:  Negative for dysphoric mood. The  patient is not nervous/anxious.    Objective:  BP 140/78    Pulse 73    Temp 97.9 F (36.6 C) (Temporal)    Ht 5\' 3"  (1.6 m)    Wt 131 lb 8 oz (59.6 kg)    LMP 07/25/1998    SpO2 97%    BMI 23.29 kg/m   Wt Readings from Last 3 Encounters:  09/21/21 131 lb 8 oz (59.6 kg)  08/17/21 134 lb (60.8 kg)  07/29/21 130 lb 6.4 oz (59.1 kg)      Physical Exam Vitals and nursing note reviewed.  Constitutional:      Appearance: Normal appearance. She is not ill-appearing.  HENT:     Head: Normocephalic and atraumatic.     Right Ear: Tympanic membrane, ear canal and external ear normal. There is no impacted cerumen.     Left Ear: Tympanic membrane, ear canal and external ear normal. There is no impacted cerumen.  Eyes:     General:        Right eye: No discharge.        Left eye: No discharge.     Extraocular Movements: Extraocular movements intact.     Conjunctiva/sclera: Conjunctivae normal.     Pupils: Pupils are equal, round, and reactive to light.  Neck:     Thyroid: No thyroid mass or thyromegaly.     Vascular: No carotid bruit.  Cardiovascular:     Rate and Rhythm: Normal rate and regular rhythm.     Pulses: Normal pulses.     Heart sounds: Normal heart sounds. No murmur heard. Pulmonary:     Effort: Pulmonary effort is normal. No respiratory distress.     Breath sounds: Normal breath sounds. No wheezing, rhonchi or rales.  Abdominal:     General: Bowel sounds are normal. There is no distension.     Palpations: Abdomen is soft. There is no mass.     Tenderness: There is no abdominal tenderness. There is no guarding or rebound.     Hernia: No hernia  is present.  Musculoskeletal:     Cervical back: Normal range of motion and neck supple. No rigidity.     Right lower leg: No edema.     Left lower leg: No edema.  Lymphadenopathy:     Cervical: No cervical adenopathy.  Skin:    General: Skin is warm and dry.     Findings: No rash.  Neurological:     General: No focal deficit  present.     Mental Status: She is alert. Mental status is at baseline.     Comments:  Recall 3/3 Calculation 5/5 DLROW   Psychiatric:        Mood and Affect: Mood normal.        Behavior: Behavior normal.      Lab Results  Component Value Date   CREATININE 0.65 07/09/2021   BUN 9 07/09/2021   NA 135 07/09/2021   K 4.4 07/09/2021   CL 99 07/09/2021   CO2 30 07/09/2021    Lab Results  Component Value Date   ALT 12 07/09/2021   AST 19 07/09/2021   ALKPHOS 94 07/09/2021   BILITOT 0.6 07/09/2021    Lab Results  Component Value Date   CHOL 198 07/09/2021   HDL 75.00 07/09/2021   LDLCALC 97 07/09/2021   TRIG 131.0 07/09/2021   CHOLHDL 3 07/09/2021    Lab Results  Component Value Date   WBC 7.0 04/06/2021   HGB 12.4 04/06/2021   HCT 38.2 04/06/2021   MCV 86.0 04/06/2021   PLT 298 04/06/2021    Lab Results  Component Value Date   TSH 2.28 07/09/2021    Lab Results  Component Value Date   HGBA1C 5.9 07/09/2021    Assessment & Plan:  This visit occurred during the SARS-CoV-2 public health emergency.  Safety protocols were in place, including screening questions prior to the visit, additional usage of staff PPE, and extensive cleaning of exam room while observing appropriate contact time as indicated for disinfecting solutions.   Problem List Items Addressed This Visit     Medicare annual wellness visit, subsequent (Chronic)    I have personally reviewed the Medicare Annual Wellness questionnaire and have noted 1. The patient's medical and social history 2. Their use of alcohol, tobacco or illicit drugs 3. Their current medications and supplements 4. The patient's functional ability including ADL's, fall risks, home safety risks and hearing or visual impairment. Cognitive function has been assessed and addressed as indicated.  5. Diet and physical activity 6. Evidence for depression or mood disorders The patients weight, height, BMI have been recorded in the  chart. I have made referrals, counseling and provided education to the patient based on review of the above and I have provided the pt with a written personalized care plan for preventive services. Provider list updated.. See scanned questionairre as needed for further documentation. Reviewed preventative protocols and updated unless pt declined.       Health maintenance examination (Chronic)    Preventative protocols reviewed and updated unless pt declined. Discussed healthy diet and lifestyle.       Advanced directives, counseling/discussion (Chronic)    Advanced care planning - HCPOA scanned into chart 06/2015. HCPOA is husband Kollyns Mickelson. Does not want prolonged life support if terminally ill.       Chronic insomnia    Continues eszopiclone (Lunesta) 3mg  nightly after failing several other medications      MDD (major depressive disorder), recurrent episode, moderate (HCC)    Sees  psychiatrist - on lamictal, abilify, xanax.       [name] syndrome    Continue amlodipine 5mg  daily.       GERD    Continue pantoprazole 40mg  bid, followed by GI      Relevant Medications   linaclotide (LINZESS) 290 MCG CAPS capsule   Drug-induced constipation    Continue linzess and movantik, with daily miralax and PRN senokot/MOM.       Chronic interstitial cystitis    Sees urology yearly Amalia Hailey)      Cervical disc disorder with radiculopathy of cervical region - Primary   Fibromyalgia    Continue lyrica 150mg  daily through rheumatology.       Postmenopausal atrophic vaginitis    On intrarosa through GYN      Chronic pain syndrome   Prediabetes    Chronic, stable. Caution with added sugars.       Stargardt's disease    Regularly sees ophthalmology, noting progression R>L.       Iron deficiency    Continue oral iron MWF      Biliary stasis    Continues ursodiol BID through GI.       DDD (degenerative disc disease), cervical   DDD (degenerative disc disease), lumbar    Dyslipidemia    Chronic, stable period off statin. The 10-year ASCVD risk score (Arnett DK, et al., 2019) is: 6.1%   Values used to calculate the score:     Age: 45 years     Sex: Female     Is Non-Hispanic African American: No     Diabetic: No     Tobacco smoker: No     Systolic Blood Pressure: 354 mmHg     Is BP treated: Yes     HDL Cholesterol: 75 mg/dL     Total Cholesterol: 198 mg/dL       Status post total replacement of left hip   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HCC)    Appreciate rheum, cards, pulm care      Pulmonary nodule    Stable on rpt imaging.       Osteopenia    Followed by GYN - upcoming DEXA this month. Doing ok with calcium and vitamin D intake, limited weight bearing exercises.       Scoliosis     Meds ordered this encounter  Medications   linaclotide (LINZESS) 290 MCG CAPS capsule    Sig: Take 1 capsule (290 mcg total) by mouth daily.    Dispense:  90 capsule    Refill:  1   No orders of the defined types were placed in this encounter.   Patient instructions: You are doing well today  Good to see you today.  Return as needed or in 3 months for pain follow up visit.   Follow up plan: Return in about 3 months (around 12/19/2021) for follow up visit.  Ria Bush, MD

## 2021-09-21 NOTE — Patient Instructions (Addendum)
You are doing well today  Good to see you today.  Return as needed or in 3 months for pain follow up visit.   Health Maintenance for Postmenopausal Women Menopause is a normal process in which your ability to get pregnant comes to an end. This process happens slowly over many months or years, usually between the ages of 68 and 22. Menopause is complete when you have missed your menstrual period for 12 months. It is important to talk with your health care provider about some of the most common conditions that affect women after menopause (postmenopausal women). These include heart disease, cancer, and bone loss (osteoporosis). Adopting a healthy lifestyle and getting preventive care can help to promote your health and wellness. The actions you take can also lower your chances of developing some of these common conditions. What are the signs and symptoms of menopause? During menopause, you may have the following symptoms: Hot flashes. These can be moderate or severe. Night sweats. Decrease in sex drive. Mood swings. Headaches. Tiredness (fatigue). Irritability. Memory problems. Problems falling asleep or staying asleep. Talk with your health care provider about treatment options for your symptoms. Do I need hormone replacement therapy? Hormone replacement therapy is effective in treating symptoms that are caused by menopause, such as hot flashes and night sweats. Hormone replacement carries certain risks, especially as you become older. If you are thinking about using estrogen or estrogen with progestin, discuss the benefits and risks with your health care provider. How can I reduce my risk for heart disease and stroke? The risk of heart disease, heart attack, and stroke increases as you age. One of the causes may be a change in the body's hormones during menopause. This can affect how your body uses dietary fats, triglycerides, and cholesterol. Heart attack and stroke are medical emergencies.  There are many things that you can do to help prevent heart disease and stroke. Watch your blood pressure High blood pressure causes heart disease and increases the risk of stroke. This is more likely to develop in people who have high blood pressure readings or are overweight. Have your blood pressure checked: Every 3-5 years if you are 20-40 years of age. Every year if you are 74 years old or older. Eat a healthy diet  Eat a diet that includes plenty of vegetables, fruits, low-fat dairy products, and lean protein. Do not eat a lot of foods that are high in solid fats, added sugars, or sodium. Get regular exercise Get regular exercise. This is one of the most important things you can do for your health. Most adults should: Try to exercise for at least 150 minutes each week. The exercise should increase your heart rate and make you sweat (moderate-intensity exercise). Try to do strengthening exercises at least twice each week. Do these in addition to the moderate-intensity exercise. Spend less time sitting. Even light physical activity can be beneficial. Other tips Work with your health care provider to achieve or maintain a healthy weight. Do not use any products that contain nicotine or tobacco. These products include cigarettes, chewing tobacco, and vaping devices, such as e-cigarettes. If you need help quitting, ask your health care provider. Know your numbers. Ask your health care provider to check your cholesterol and your blood sugar (glucose). Continue to have your blood tested as directed by your health care provider. Do I need screening for cancer? Depending on your health history and family history, you may need to have cancer screenings at different stages of  your life. This may include screening for: Breast cancer. Cervical cancer. Lung cancer. Colorectal cancer. What is my risk for osteoporosis? After menopause, you may be at increased risk for osteoporosis. Osteoporosis is a  condition in which bone destruction happens more quickly than new bone creation. To help prevent osteoporosis or the bone fractures that can happen because of osteoporosis, you may take the following actions: If you are 74-65 years old, get at least 1,000 mg of calcium and at least 600 international units (IU) of vitamin D per day. If you are older than age 58 but younger than age 25, get at least 1,200 mg of calcium and at least 600 international units (IU) of vitamin D per day. If you are older than age 2, get at least 1,200 mg of calcium and at least 800 international units (IU) of vitamin D per day. Smoking and drinking excessive alcohol increase the risk of osteoporosis. Eat foods that are rich in calcium and vitamin D, and do weight-bearing exercises several times each week as directed by your health care provider. How does menopause affect my mental health? Depression may occur at any age, but it is more common as you become older. Common symptoms of depression include: Feeling depressed. Changes in sleep patterns. Changes in appetite or eating patterns. Feeling an overall lack of motivation or enjoyment of activities that you previously enjoyed. Frequent crying spells. Talk with your health care provider if you think that you are experiencing any of these symptoms. General instructions See your health care provider for regular wellness exams and vaccines. This may include: Scheduling regular health, dental, and eye exams. Getting and maintaining your vaccines. These include: Influenza vaccine. Get this vaccine each year before the flu season begins. Pneumonia vaccine. Shingles vaccine. Tetanus, diphtheria, and pertussis (Tdap) booster vaccine. Your health care provider may also recommend other immunizations. Tell your health care provider if you have ever been abused or do not feel safe at home. Summary Menopause is a normal process in which your ability to get pregnant comes to an  end. This condition causes hot flashes, night sweats, decreased interest in sex, mood swings, headaches, or lack of sleep. Treatment for this condition may include hormone replacement therapy. Take actions to keep yourself healthy, including exercising regularly, eating a healthy diet, watching your weight, and checking your blood pressure and blood sugar levels. Get screened for cancer and depression. Make sure that you are up to date with all your vaccines. This information is not intended to replace advice given to you by your health care provider. Make sure you discuss any questions you have with your health care provider. Document Revised: 11/30/2020 Document Reviewed: 11/30/2020 Elsevier Patient Education  [employer].

## 2021-09-23 DIAGNOSIS — Z7189 Other specified counseling: Secondary | ICD-10-CM | POA: Insufficient documentation

## 2021-09-23 MED ORDER — LINACLOTIDE 290 MCG PO CAPS: 290.0000 ug | ORAL_CAPSULE | Freq: Every day | ORAL | 1 refills | Status: DC

## 2021-09-23 NOTE — Assessment & Plan Note (Signed)
Regularly sees ophthalmology, noting progression R>L.  ?

## 2021-09-23 NOTE — Assessment & Plan Note (Signed)
Sees urology yearly Rachel Duran) ?

## 2021-09-23 NOTE — Assessment & Plan Note (Signed)
Chronic, stable period off statin. ?The 10-year ASCVD risk score (Arnett DK, et al., 2019) is: 6.1% ?  Values used to calculate the score: ?    Age: 66 years ?    Sex: Female ?    Is Non-Hispanic African American: No ?    Diabetic: No ?    Tobacco smoker: No ?    Systolic Blood Pressure: 102 mmHg ?    Is BP treated: Yes ?    HDL Cholesterol: 75 mg/dL ?    Total Cholesterol: 198 mg/dL  ?

## 2021-09-23 NOTE — Assessment & Plan Note (Addendum)
Continue lyrica 150mg  daily through rheumatology.  ?

## 2021-09-23 NOTE — Assessment & Plan Note (Signed)
Continue amlodipine 5 mg daily

## 2021-09-23 NOTE — Assessment & Plan Note (Signed)
Preventative protocols reviewed and updated unless pt declined. Discussed healthy diet and lifestyle.  

## 2021-09-23 NOTE — Assessment & Plan Note (Signed)
Continue pantoprazole 40mg  bid, followed by GI ?

## 2021-09-23 NOTE — Assessment & Plan Note (Signed)
Continues eszopiclone (Lunesta) 3mg  nightly after failing several other medications ?

## 2021-09-23 NOTE — Assessment & Plan Note (Signed)
On intrarosa through GYN ?

## 2021-09-23 NOTE — Assessment & Plan Note (Signed)
Chronic, stable. Caution with added sugars.  ?

## 2021-09-23 NOTE — Assessment & Plan Note (Signed)
Appreciate rheum, cards, pulm care ?

## 2021-09-23 NOTE — Assessment & Plan Note (Signed)
Advanced care planning - HCPOA scanned into chart 06/2015. HCPOA is husband Rachel Duran. Does not want prolonged life support if terminally ill.  ?

## 2021-09-23 NOTE — Assessment & Plan Note (Signed)

## 2021-09-23 NOTE — Assessment & Plan Note (Signed)
Continue oral iron MWF ?

## 2021-09-23 NOTE — Assessment & Plan Note (Signed)
Sees psychiatrist - on lamictal, abilify, xanax.  ?

## 2021-09-23 NOTE — Assessment & Plan Note (Signed)
Continue linzess and movantik, with daily miralax and PRN senokot/MOM.  ?

## 2021-09-23 NOTE — Assessment & Plan Note (Signed)
Followed by GYN - upcoming DEXA this month. ?Doing ok with calcium and vitamin D intake, limited weight bearing exercises.  ?

## 2021-09-23 NOTE — Assessment & Plan Note (Signed)
Stable on rpt imaging.  ?

## 2021-09-23 NOTE — Assessment & Plan Note (Signed)
Continues ursodiol BID through GI.  ?

## 2021-09-24 NOTE — Telephone Encounter (Signed)
Discussed this at [facility].  ?

## 2021-10-06 ENCOUNTER — Ambulatory Visit
Admission: RE | Admit: 2021-10-06 | Discharge: 2021-10-06 | Disposition: A | Discharge status: Home / Self Care | Payer: Medicare Other | Source: Ambulatory Visit | Attending: Obstetrics and Gynecology | Admitting: Obstetrics and Gynecology

## 2021-10-06 DIAGNOSIS — M8580 Other specified disorders of bone density and structure, unspecified site: Secondary | ICD-10-CM

## 2021-10-06 DIAGNOSIS — Z780 Asymptomatic menopausal state: Secondary | ICD-10-CM

## 2021-10-07 ENCOUNTER — Encounter: Payer: Self-pay | Admitting: Family Medicine

## 2021-10-07 NOTE — Telephone Encounter (Signed)
Name of Medication: Adderall XR ?Name of Pharmacy: [facility] ?Last Fill or Written Date and Quantity: 09/14/21, #90 ?Last Office Visit and Type: 09/21/21, AWV ?Next Office Visit and Type: 12/21/21, 3 mo chronic pain f/u ?Last Controlled Substance Agreement Date: 04/19/19 ?Last UDS: 04/19/19 ? ? ?

## 2021-10-08 ENCOUNTER — Other Ambulatory Visit: Payer: Self-pay | Admitting: Family Medicine

## 2021-10-08 NOTE — Telephone Encounter (Signed)
Name of Medication: Movantik ?Name of Pharmacy: [facility] ?Last Fill or Written Date and Quantity: 06/10/21, #90 ?Last Office Visit and Type: 09/21/21, AWV ?Next Office Visit and Type: 12/21/21, 3 mo chronic pain f/u ?Last Controlled Substance Agreement Date: 04/19/19 ?Last UDS: 04/19/19 ? ? ?

## 2021-10-09 MED ORDER — AMPHETAMINE-DEXTROAMPHET ER 30 MG PO CP24: ORAL_CAPSULE | ORAL | 0 refills | Status: DC

## 2021-10-09 NOTE — Addendum Note (Signed)
Addended by: Ria Bush on: 10/09/2021 11:28 AM ? ? Modules accepted: Orders ? ?

## 2021-10-24 ENCOUNTER — Encounter: Payer: Self-pay | Admitting: Rheumatology

## 2021-10-25 NOTE — Telephone Encounter (Signed)
Spoke with patient and scheduled patient for 10/27/2021 at 8:45 am.  ?

## 2021-10-25 NOTE — Telephone Encounter (Signed)
Please schedule sooner office visit this week with Dr. Estanislado Pandy for further evaluation.

## 2021-10-26 NOTE — Progress Notes (Signed)
? ?Office Visit Note ? ?Patient: Rachel Duran             ?Date of Birth: [birth year]           ?MRN: [id number]             ?PCP: Rachel Bush, MD ?Referring: Rachel Bush, MD ?Visit Date: 10/27/2021 ?Occupation: '@GUAROCC'$ @ ? ?Subjective:  ?Worsening of Raynauds ? ?History of Present Illness: Rachel Duran is a 66 y.o. female with history of limited systemic sclerosis, osteoarthritis, degenerative disc disease and fibromyalgia syndrome.  She states for the last 2 months she has been having severe Raynauds.  She states that some fingers turn almost purple in the evening.  She has been using nitroglycerin ointment which is not helpful.  She also takes Norvasc 7.5 mg p.o. daily.  She denies any increased shortness of breath or palpitations.  She continues to have dry mouth and dry eyes.  She continues to have pain and discomfort in her bilateral hands and bilateral knee joints.  She also has generalized muscle pain from fibromyalgia.  Recently she has been experiencing some pain in her right elbow and numbness in that area. ? ?Activities of Daily Living:  ?Patient reports morning stiffness for 2-3 hours.   ?Patient Reports nocturnal pain.  ?Difficulty dressing/grooming: Denies ?Difficulty climbing stairs: Reports ?Difficulty getting out of chair: Reports ?Difficulty using hands for taps, buttons, cutlery, and/or writing: Reports ? ?Review of Systems  ?Constitutional:  Positive for fatigue.  ?HENT:  Positive for mouth dryness. Negative for mouth sores and nose dryness.   ?Eyes:  Positive for dryness. Negative for pain and itching.  ?Respiratory:  Negative for shortness of breath and difficulty breathing.   ?Cardiovascular:  Negative for chest pain and palpitations.  ?Gastrointestinal:  Positive for constipation. Negative for blood in stool and diarrhea.  ?Endocrine: Negative for increased urination.  ?Genitourinary:  Negative for difficulty urinating.  ?Musculoskeletal:  Positive for joint pain,  joint pain, myalgias, morning stiffness, muscle tenderness and myalgias. Negative for joint swelling.  ?Skin:  Positive for color change. Negative for rash and redness.  ?Allergic/Immunologic: Negative for susceptible to infections.  ?Neurological:  Positive for numbness. Negative for dizziness, headaches, memory loss and weakness.  ?Hematological:  Positive for bruising/bleeding tendency.  ?Psychiatric/Behavioral:  Negative for confusion.   ? ?PMFS History:  ?Patient Active Problem List  ? Diagnosis Date Noted  ? Advanced directives, counseling/discussion 09/23/2021  ? Strep pharyngitis 08/18/2021  ? Swelling of right index finger 07/14/2021  ? Synovitis of right knee   ? Scoliosis 01/13/2021  ? Osteopenia 07/02/2020  ? Status post left knee surgery 06/01/2020  ? Pulmonary nodule 05/27/2020  ? 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[place]) 05/05/2020  ? Chronic patellofemoral pain of right knee 05/05/2020  ? Positive ANA (antinuclear antibody) 06/05/2019  ? 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  ? Unilateral primary osteoarthritis, left hip 01/28/2019  ? Pelvic pain 12/12/2018  ? Common bile duct (CBD) obstruction   ? Venous insufficiency of left lower extremity 07/04/2018  ? Dyslipidemia 09/22/2017  ? DDD (degenerative disc disease), cervical 04/03/2017  ? DDD (degenerative disc disease), lumbar 04/03/2017  ? Onychomycosis 03/21/2017  ? Dysphagia 10/18/2016  ? Biliary stasis 09/26/2016  ? HNP (herniated nucleus pulposus), lumbar 09/23/2015  ? Iron deficiency 07/30/2015  ? Health maintenance examination 06/15/2015  ? 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 Clavicle enlargement 12/13/2014  ? Prediabetes 12/13/2014  ? Plantar fasciitis, bilateral 09/11/2014  ? Medicare annual wellness visit, subsequent 06/10/2014  ?  Abnormal thyroid function test 06/10/2014  ? Chronic pain syndrome 06/10/2014  ? CFS (chronic fatigue syndrome) 04/08/2014  ? Postmenopausal atrophic vaginitis 10/19/2012  ? 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 Cervical disc disorder with radiculopathy of cervical region 05/28/2010  ? APHTHOUS ULCERS 01/31/2008  ? Chronic insomnia 11/09/2007  ? Drug-induced constipation 10/11/2007  ? Allergic rhinitis 04/12/2007  ? MDD (major depressive disorder), recurrent episode, moderate ([place]) 03/05/2007  ? [name] syndrome 03/05/2007  ? GERD 03/05/2007  ? ROSACEA 03/05/2007  ? NEURALGIA 03/05/2007  ? 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 Chronic interstitial cystitis 12/06/2006  ? Fibromyalgia 12/06/2006  ?  ?Past Medical History:  ?Diagnosis Date  ? Abdominal pain last 4 months  ? and nausea also  ? Allergy   ? Anemia   ? history of  ? Anxiety   ? Bipolar disorder ([place])   ? atpical bipolar disorder  ? 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 hx. C6- C7 -hx. past fusion(bone graft used)  ? Cholecystitis   ? Chronic pain   ? DDD (degenerative disc disease), lumbar 09/2015  ? dextroscoliosis with multilevel DDD and facet arthrosis most notable for R foraminal disc protrusion L4/5 producing severe R neural foraminal stenosis abutting R L4 nerve root, moderate spinal canal and mild lat recesss and R neural foraminal stenosis L3/4 (MRI)  ? Depression   ? bipolar depression  ? Disorders of porphyrin metabolism   ? Felon of finger of left hand 11/10/2016  ? Fibromyalgia   ? GERD (gastroesophageal reflux disease)   ? Headache   ? occasionally  ? Internal hemorrhoids   ? Interstitial cystitis 06/06/2012  ? hx.  ? Irritable bowel syndrome   ? PONV (postoperative nausea and vomiting)   ? now uses stomach blockers and no ponv  ? 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 Evaluated in Pulmonary clinic/ [facility]/ [place] /  02/07/12 > referred to Health Dept 02/10/2012     - POS GOLD    01/31/2012    ? Raynauds disease   ? hx.  ? Scleroderma ([place]) 04/24/2020  ? Seronegative arthritis   ? Rachel Duran  ? 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 hereditary macular degeneration (Dr Baird Cancer retinologist)  ?   ?Family History  ?Problem Relation Age of Onset  ? CAD Father 25  ?     MI, nonsmoker  ? Esophageal cancer Father 22  ? Stomach cancer Father   ? Scleroderma Mother   ? Hypertension Mother   ? Esophageal cancer Paternal Grandfather   ? Stomach cancer Paternal Grandfather   ? Diabetes Maternal Grandmother   ? Arthritis Brother   ? Stroke Neg Hx   ? Colon cancer Neg Hx   ? Rectal cancer Neg Hx   ? ?Past Surgical History:  ?Procedure Laterality Date  ? ANTERIOR CERVICAL DECOMP/DISCECTOMY FUSION  2004  ? C5/6, C6/7  ? ANTERIOR CERVICAL DECOMP/DISCECTOMY FUSION  02/2016  ? C3/4, C4/5 with plating [name])  ? AUGMENTATION MAMMAPLASTY Bilateral 03/25/2010  ? BREAST ENHANCEMENT SURGERY  2010  ? BREAST IMPLANT EXCHANGE  10/2014  ? exchange saline implants, B mastopexy/capsulorraphy ([facility])  ? BUNIONECTOMY Bilateral yrs ago  ? [place]  ? x 1  ? CHOLECYSTECTOMY  06/11/2012  ? Procedure: LAPAROSCOPIC CHOLECYSTECTOMY WITH INTRAOPERATIVE CHOLANGIOGRAM;  Surgeon: [name], MD,FACS;  Location: [facility];  Service: General;  Laterality: N/A;  ? COLONOSCOPY  02/2018  ? done for positive cologuard -  WNL, rpt 10 yrs [facility])  ? CYSTOSCOPY    ? ERCP  05/22/2012  ? Procedure: ENDOSCOPIC RETROGRADE CHOLANGIOPANCREATOGRAPHY (ERCP);  Surgeon: [name], MD,FACG;  Location: [facility];  Service: Endoscopy;  Laterality: N/A;  ? ERCP N/A 09/17/2013  ? Procedure: ENDOSCOPIC RETROGRADE CHOLANGIOPANCREATOGRAPHY (ERCP);  Surgeon: [name], MD;  Location: [facility];  Service: Endoscopy;  Laterality: N/A;  ? ERCP N/A 09/27/2018  ? Procedure: ENDOSCOPIC RETROGRADE CHOLANGIOPANCREATOGRAPHY (ERCP);  Surgeon: [name], MD;  Location: [facility];  Service: Endoscopy;  Laterality: N/A;  ? ESOPHAGOGASTRODUODENOSCOPY  09/2016  ? WNL. esophagus dilated [facility])  ? HEMORRHOID BANDING  09-23-13  ? --Dr. Greer Pickerel  ? HERNIA REPAIR    ? inguinal  ? HYSTEROSCOPY W/ ENDOMETRIAL ABLATION    ? KNEE ARTHROSCOPY Right  04/06/2021  ? Procedure: RIGHT KNEE ARTHROSCOPY WITH DEBRIDEMENT;  Surgeon: [name], MD;  Location: [place];  Service: Orthopedics;  Laterality: Right;  ? NASAL SINUS SURGERY

## 2021-10-27 ENCOUNTER — Telehealth: Payer: Self-pay | Admitting: Pharmacist

## 2021-10-27 ENCOUNTER — Encounter: Payer: Self-pay | Admitting: Rheumatology

## 2021-10-27 ENCOUNTER — Ambulatory Visit (INDEPENDENT_AMBULATORY_CARE_PROVIDER_SITE_OTHER): Payer: Medicare Other | Admitting: Rheumatology

## 2021-10-27 VITALS — BP 149/87 | HR 99 | Ht 63.0 in | Wt 134.6 lb

## 2021-10-27 DIAGNOSIS — Z8639 Personal history of other endocrine, nutritional and metabolic disease: Secondary | ICD-10-CM

## 2021-10-27 DIAGNOSIS — Z8269 Family history of other diseases of the musculoskeletal system and connective tissue: Secondary | ICD-10-CM

## 2021-10-27 DIAGNOSIS — M19041 Primary osteoarthritis, right hand: Secondary | ICD-10-CM

## 2021-10-27 DIAGNOSIS — Z8659 Personal history of other mental and behavioral disorders: Secondary | ICD-10-CM

## 2021-10-27 DIAGNOSIS — M5136 Other intervertebral disc degeneration, lumbar region: Secondary | ICD-10-CM

## 2021-10-27 DIAGNOSIS — G9332 Myalgic encephalomyelitis/chronic fatigue syndrome: Secondary | ICD-10-CM

## 2021-10-27 DIAGNOSIS — I7300 Raynaud's syndrome without gangrene: Secondary | ICD-10-CM | POA: Diagnosis not present

## 2021-10-27 DIAGNOSIS — Z96642 Presence of left artificial hip joint: Secondary | ICD-10-CM

## 2021-10-27 DIAGNOSIS — M1712 Unilateral primary osteoarthritis, left knee: Secondary | ICD-10-CM

## 2021-10-27 DIAGNOSIS — G894 Chronic pain syndrome: Secondary | ICD-10-CM

## 2021-10-27 DIAGNOSIS — M19042 Primary osteoarthritis, left hand: Secondary | ICD-10-CM

## 2021-10-27 DIAGNOSIS — R5383 Other fatigue: Secondary | ICD-10-CM

## 2021-10-27 DIAGNOSIS — M349 Systemic sclerosis, unspecified: Secondary | ICD-10-CM

## 2021-10-27 DIAGNOSIS — M85851 Other specified disorders of bone density and structure, right thigh: Secondary | ICD-10-CM

## 2021-10-27 DIAGNOSIS — Z96651 Presence of right artificial knee joint: Secondary | ICD-10-CM

## 2021-10-27 DIAGNOSIS — M797 Fibromyalgia: Secondary | ICD-10-CM

## 2021-10-27 DIAGNOSIS — M4185 Other forms of scoliosis, thoracolumbar region: Secondary | ICD-10-CM

## 2021-10-27 DIAGNOSIS — Z8719 Personal history of other diseases of the digestive system: Secondary | ICD-10-CM

## 2021-10-27 DIAGNOSIS — M5030 Other cervical disc degeneration, unspecified cervical region: Secondary | ICD-10-CM

## 2021-10-27 DIAGNOSIS — R7303 Prediabetes: Secondary | ICD-10-CM

## 2021-10-27 MED ORDER — TADALAFIL 20 MG PO TABS: ORAL_TABLET | ORAL | 0 refills | Status: DC

## 2021-10-27 NOTE — Progress Notes (Signed)
Reviewed bone density with patient - she is in osteopenia range. Therapy is not warranted. Patient currently takes '600mg'$  twice daily. She does take Vitamin D (she believes 800 IU daily).  Advised her to increase to 2000 units daily and continue calcium rich diet ? ?Reviewed tadalafil for [name] and scleroderma. Reviewed risk for low blood pressure, dizziness, headache. Her husband is a paramedic and they have BP cuff at home to monitor BP if needed ? ?Her dose will be: '5mg'$  (0.25 tab) daily x 1 week, '10mg'$  (0.5 tab) daily x 1 week, '15mg'$  (0.75 tab) daily x 1 week, then '20mg'$  (1 tab) daily thereafter as maintenance ? ?She will discontinue nitrate and continue amlodipine ? ?Knox Saliva, PharmD, MPH, BCPS ?Clinical Pharmacist (Rheumatology and Pulmonology) ?

## 2021-10-27 NOTE — Patient Instructions (Signed)
Tadalafil Tablets (Erectile Dysfunction, BPH) ?What is this medication? ?TADALAFIL (tah DA la fil) treats erectile dysfunction (ED). It works by increasing blood flow to the penis, which helps to maintain an erection. It may also be used to treat symptoms of an enlarged prostate (benign prostatic hyperplasia). ?This medicine may be used for other purposes; ask your health care provider or pharmacist if you have questions. ?COMMON BRAND NAME(S): Kathaleen Bury, Cialis ?What should I tell my care team before I take this medication? ?They need to know if you have any of these conditions: ?Anatomical deformation of the penis, Peyronie's disease, or history of priapism (painful and prolonged erection) ?Bleeding disorders ?Eye or vision problems, including a rare inherited eye disease called retinitis pigmentosa ?Heart disease, angina, a history of heart attack, irregular heart beats, or other heart problems ?High or low blood pressure ?History of blood diseases, like sickle cell anemia or leukemia ?History of stomach bleeding ?Kidney disease ?Liver disease ?Stroke ?An unusual or allergic reaction to tadalafil, other medications, foods, dyes, or preservatives ?Pregnant or trying to get pregnant ?Breast-feeding ?How should I use this medication? ?Take this medication by mouth with a glass of water. Follow the directions on the prescription label. You may take this medication with or without meals. When this medication is used for erection problems, your care team may prescribe it to be taken once daily or as needed. If you are taking the medication as needed, you may be able to have sexual activity 30 minutes after taking it and for up to 36 hours after taking it. Whether you are taking the medication as needed or once daily, you should not take more than one dose per day. If you are taking this medication for symptoms of benign prostatic hyperplasia (BPH) or to treat both BPH and an erection problem, take the dose once  daily at about the same time each day. Do not take your medication more often than directed. ?Talk to your care team about the use of this medication in children. Special care may be needed. ?Overdosage: If you think you have taken too much of this medicine contact a poison control center or emergency room at once. ?NOTE: This medicine is only for you. Do not share this medicine with others. ?What if I miss a dose? ?If you are taking this medication as needed for erection problems, this does not apply. If you miss a dose while taking this medication once daily for an erection problem, benign prostatic hyperplasia, or both, take it as soon as you remember, but do not take more than one dose per day. ?What may interact with this medication? ?Do not take this medication with any of the following: ?Nitrates like amyl nitrite, isosorbide dinitrate, isosorbide mononitrate, nitroglycerin ?Other medications for erectile dysfunction like avanafil, sildenafil, vardenafil ?Other tadalafil products ([employer]circa) ?Riociguat ?This medication may also interact with the following: ?Certain medications for high blood pressure ?Certain medications for the treatment of HIV infection or AIDS ?Certain medications used for fungal or yeast infections, like fluconazole, itraconazole, ketoconazole, and voriconazole ?Certain medications used for seizures like carbamazepine, phenytoin, and phenobarbital ?Grapefruit juice ?Macrolide antibiotics like clarithromycin, erythromycin, troleandomycin ?Medications for prostate problems ?Rifabutin, rifampin or rifapentine ?This list may not describe all possible interactions. Give your health care provider a list of all the medicines, herbs, non-prescription drugs, or dietary supplements you use. Also tell them if you smoke, drink alcohol, or use illegal drugs. Some items may interact with your medicine. ?What should I watch for  while using this medication? ?If you notice any changes in your vision while  taking this medication, call your care team as soon as possible. Stop using this medication and call your care team right away if you have a loss of sight in one or both eyes. ?Contact your care team right away if the erection lasts longer than 4 hours or if it becomes painful. This may be a sign of serious problem and must be treated right away to prevent permanent damage. ?If you experience symptoms of nausea, dizziness, chest pain or arm pain upon initiation of sexual activity after taking this medication, you should refrain from further activity and call your care team as soon as possible. ?Do not drink alcohol to excess (examples, 5 glasses of wine or 5 shots of whiskey) when taking this medication. When taken in excess, alcohol can increase your chances of getting a headache or getting dizzy, increasing your heart rate or lowering your blood pressure. ?Using this medication does not protect you or your partner against HIV infection (the virus that causes AIDS) or other sexually transmitted diseases. ?What side effects may I notice from receiving this medication? ?Side effects that you should report to your care team as soon as possible: ?Allergic reactions--skin rash, itching, hives, swelling of the face, lips, tongue, or throat ?Hearing loss or ringing in ears ?Heart attack--pain or tightness in the chest, shoulders, arms, or jaw, nausea, shortness of breath, cold or clammy skin, feeling faint or lightheaded ?Low blood pressure--dizziness, feeling faint or lightheaded, blurry vision ?Prolonged or painful erection ?Redness, blistering, peeling, or loosening of the skin, including inside the mouth ?Stroke--sudden numbness or weakness of the face, arm, or leg, trouble speaking, confusion, trouble walking, loss of balance or coordination, dizziness, severe headache, change in vision ?Sudden vision loss in one or both eyes ?Side effects that usually do not require medical attention (report to your care team if  they continue or are bothersome): ?Back pain ?Facial flushing or redness ?Headache ?Muscle pain ?Runny or stuffy nose ?Upset stomach ?This list may not describe all possible side effects. Call your doctor for medical advice about side effects. You may report side effects to FDA at 1-800-FDA-1088. ?Where should I keep my medication? ?Keep out of the reach of children. ?Store at room temperature between 15 and 30 degrees C (59 and 86 degrees F). Throw away any unused medication after the expiration date. ?NOTE: This sheet is a summary. It may not cover all possible information. If you have questions about this medicine, talk to your doctor, pharmacist, or health care provider. ?? 2022 Elsevier/Gold Standard (2020-09-24 00:00:00) ? ?

## 2021-10-27 NOTE — Telephone Encounter (Signed)
Submitted a Prior Authorization request to [facility] for  tadalafil  via CoverMyMeds. Will update once we receive a response. ? ?Key: [id number] ? ?Knox Saliva, PharmD, MPH, BCPS ?Clinical Pharmacist (Rheumatology and Pulmonology) ?

## 2021-10-28 ENCOUNTER — Ambulatory Visit: Payer: Medicare Other | Admitting: Rheumatology

## 2021-10-28 LAB — CBC WITH DIFFERENTIAL/PLATELET
Absolute Monocytes: 527 cells/uL (ref 200–950)
Basophils Absolute: 59 cells/uL (ref 0–200)
Basophils Relative: 0.9 %
Eosinophils Absolute: 150 cells/uL (ref 15–500)
Eosinophils Relative: 2.3 %
HCT: 39.1 % (ref 35.0–45.0)
Hemoglobin: 12.6 g/dL (ref 11.7–15.5)
Lymphs Abs: 2217 cells/uL (ref 850–3900)
MCH: 28.3 pg (ref 27.0–33.0)
MCHC: 32.2 g/dL (ref 32.0–36.0)
MCV: 87.9 fL (ref 80.0–100.0)
MPV: 9.8 fL (ref 7.5–12.5)
Monocytes Relative: 8.1 %
Neutro Abs: 3549 cells/uL (ref 1500–7800)
Neutrophils Relative %: 54.6 %
Platelets: 276 10*3/uL (ref 140–400)
RBC: 4.45 10*6/uL (ref 3.80–5.10)
RDW: 13.5 % (ref 11.0–15.0)
Total Lymphocyte: 34.1 %
WBC: 6.5 10*3/uL (ref 3.8–10.8)

## 2021-10-28 LAB — URINALYSIS, ROUTINE W REFLEX MICROSCOPIC
Bilirubin Urine: NEGATIVE
Glucose, UA: NEGATIVE
Hgb urine dipstick: NEGATIVE
Ketones, ur: NEGATIVE
Leukocytes,Ua: NEGATIVE
Nitrite: NEGATIVE
Protein, ur: NEGATIVE
Specific Gravity, Urine: 1.006 (ref 1.001–1.035)
pH: 6.5 (ref 5.0–8.0)

## 2021-10-28 LAB — COMPLETE METABOLIC PANEL WITH GFR
AG Ratio: 2 (calc) (ref 1.0–2.5)
ALT: 13 U/L (ref 6–29)
AST: 21 U/L (ref 10–35)
Albumin: 4.4 g/dL (ref 3.6–5.1)
Alkaline phosphatase (APISO): 89 U/L (ref 37–153)
BUN: 9 mg/dL (ref 7–25)
CO2: 28 mmol/L (ref 20–32)
Calcium: 9.6 mg/dL (ref 8.6–10.4)
Chloride: 100 mmol/L (ref 98–110)
Creat: 0.65 mg/dL (ref 0.50–1.05)
Globulin: 2.2 g/dL (calc) (ref 1.9–3.7)
Glucose, Bld: 96 mg/dL (ref 65–99)
Potassium: 5 mmol/L (ref 3.5–5.3)
Sodium: 136 mmol/L (ref 135–146)
Total Bilirubin: 0.5 mg/dL (ref 0.2–1.2)
Total Protein: 6.6 g/dL (ref 6.1–8.1)
eGFR: 99 mL/min/{1.73_m2} (ref >=60)

## 2021-10-29 ENCOUNTER — Emergency Department: Payer: Medicare Other

## 2021-10-29 ENCOUNTER — Emergency Department
Admission: EM | Admit: 2021-10-29 | Discharge: 2021-10-29 | Disposition: A | Discharge status: Home / Self Care | Payer: Medicare Other | Attending: Emergency Medicine | Admitting: Emergency Medicine

## 2021-10-29 ENCOUNTER — Other Ambulatory Visit: Payer: Self-pay

## 2021-10-29 DIAGNOSIS — W19XXXA Unspecified fall, initial encounter: Secondary | ICD-10-CM | POA: Insufficient documentation

## 2021-10-29 DIAGNOSIS — S064X0A Epidural hemorrhage without loss of consciousness, initial encounter: Secondary | ICD-10-CM | POA: Diagnosis not present

## 2021-10-29 DIAGNOSIS — S064XAA Epidural hemorrhage with loss of consciousness status unknown, initial encounter: Secondary | ICD-10-CM

## 2021-10-29 DIAGNOSIS — S12400A Unspecified displaced fracture of fifth cervical vertebra, initial encounter for closed fracture: Secondary | ICD-10-CM | POA: Diagnosis not present

## 2021-10-29 DIAGNOSIS — M542 Cervicalgia: Secondary | ICD-10-CM | POA: Diagnosis not present

## 2021-10-29 DIAGNOSIS — R55 Syncope and collapse: Secondary | ICD-10-CM | POA: Diagnosis not present

## 2021-10-29 DIAGNOSIS — S199XXA Unspecified injury of neck, initial encounter: Secondary | ICD-10-CM | POA: Diagnosis present

## 2021-10-29 DIAGNOSIS — K5900 Constipation, unspecified: Secondary | ICD-10-CM | POA: Diagnosis not present

## 2021-10-29 DIAGNOSIS — S12500A Unspecified displaced fracture of sixth cervical vertebra, initial encounter for closed fracture: Secondary | ICD-10-CM | POA: Diagnosis not present

## 2021-10-29 DIAGNOSIS — S129XXA Fracture of neck, unspecified, initial encounter: Secondary | ICD-10-CM

## 2021-10-29 LAB — CBC WITH DIFFERENTIAL/PLATELET
Abs Immature Granulocytes: 0.06 10*3/uL (ref 0.00–0.07)
Basophils Absolute: 0.1 10*3/uL (ref 0.0–0.1)
Basophils Relative: 1 %
Eosinophils Absolute: 0.2 10*3/uL (ref 0.0–0.5)
Eosinophils Relative: 2 %
HCT: 37.7 % (ref 36.0–46.0)
Hemoglobin: 12.2 g/dL (ref 12.0–15.0)
Immature Granulocytes: 1 %
Lymphocytes Relative: 21 %
Lymphs Abs: 1.8 10*3/uL (ref 0.7–4.0)
MCH: 28.2 pg (ref 26.0–34.0)
MCHC: 32.4 g/dL (ref 30.0–36.0)
MCV: 87.3 fL (ref 80.0–100.0)
Monocytes Absolute: 0.6 10*3/uL (ref 0.1–1.0)
Monocytes Relative: 7 %
Neutro Abs: 5.8 10*3/uL (ref 1.7–7.7)
Neutrophils Relative %: 68 %
Platelets: 266 10*3/uL (ref 150–400)
RBC: 4.32 MIL/uL (ref 3.87–5.11)
RDW: 13.8 % (ref 11.5–15.5)
WBC: 8.5 10*3/uL (ref 4.0–10.5)
nRBC: 0 % (ref 0.0–0.2)

## 2021-10-29 LAB — COMPREHENSIVE METABOLIC PANEL
ALT: 19 U/L (ref 0–44)
AST: 38 U/L (ref 15–41)
Albumin: 3.9 g/dL (ref 3.5–5.0)
Alkaline Phosphatase: 82 U/L (ref 38–126)
Anion gap: 6 (ref 5–15)
BUN: 10 mg/dL (ref 8–23)
CO2: 29 mmol/L (ref 22–32)
Calcium: 8.6 mg/dL — ABNORMAL LOW (ref 8.9–10.3)
Chloride: 99 mmol/L (ref 98–111)
Creatinine, Ser: 0.58 mg/dL (ref 0.44–1.00)
GFR, Estimated: >60 mL/min (ref >60)
Glucose, Bld: 122 mg/dL — ABNORMAL HIGH (ref 70–99)
Potassium: 4.5 mmol/L (ref 3.5–5.1)
Sodium: 134 mmol/L — ABNORMAL LOW (ref 135–145)
Total Bilirubin: 0.9 mg/dL (ref 0.3–1.2)
Total Protein: 6.4 g/dL — ABNORMAL LOW (ref 6.5–8.1)

## 2021-10-29 LAB — PROTIME-INR
INR: 1.1 (ref 0.8–1.2)
Prothrombin Time: 13.6 seconds (ref 11.4–15.2)

## 2021-10-29 LAB — MAGNESIUM
Magnesium: 2.6 mg/dL — ABNORMAL HIGH (ref 1.7–2.4)
Magnesium: 2.6 mg/dL — ABNORMAL HIGH (ref 1.7–2.4)

## 2021-10-29 LAB — TYPE AND SCREEN
ABO/RH(D): A NEG
Antibody Screen: NEGATIVE

## 2021-10-29 LAB — ABO/RH: ABO/RH(D): A NEG

## 2021-10-29 LAB — APTT: aPTT: 26 seconds (ref 24–36)

## 2021-10-29 MED ORDER — MORPHINE SULFATE (PF) 4 MG/ML IV SOLN
4.0000 mg | Freq: Once | INTRAVENOUS | Status: AC
  Administered 2021-10-29: 4 mg via INTRAVENOUS
  Filled 2021-10-29: qty 1

## 2021-10-29 MED ORDER — ONDANSETRON HCL 4 MG/2ML IJ SOLN
4.0000 mg | Freq: Once | INTRAMUSCULAR | Status: AC
Start: 2021-10-29 — End: 2021-10-29
  Administered 2021-10-29: 4 mg via INTRAVENOUS
  Filled 2021-10-29: qty 2

## 2021-10-29 MED ORDER — LIDOCAINE 5 % EX PTCH
1.0000 | MEDICATED_PATCH | CUTANEOUS | Status: DC
  Administered 2021-10-29: 1 via TRANSDERMAL
  Filled 2021-10-29: qty 1

## 2021-10-29 MED ORDER — SODIUM CHLORIDE 0.9% IV SOLUTION
Freq: Once | INTRAVENOUS | Status: DC
  Filled 2021-10-29: qty 250

## 2021-10-29 MED ORDER — LACTATED RINGERS IV BOLUS
1000.0000 mL | Freq: Once | INTRAVENOUS | Status: AC
  Administered 2021-10-29: 1000 mL via INTRAVENOUS

## 2021-10-29 MED ORDER — ACETAMINOPHEN 500 MG PO TABS
1000.0000 mg | ORAL_TABLET | Freq: Once | ORAL | Status: DC
  Filled 2021-10-29: qty 2

## 2021-10-29 NOTE — ED Notes (Signed)
Patient transported to MRI 

## 2021-10-29 NOTE — ED Notes (Signed)
Sandwich tray and drink provided to pt. ?

## 2021-10-29 NOTE — ED Provider Notes (Signed)
? ?[facility] ?Provider Note ? ? ? Event Date/Time  ? First MD Initiated Contact with Patient 10/29/21 [phone]   ?  (approximate) ? ? ?History  ? ?Loss of Consciousness ? ? ?HPI ? ?Rachel Duran is a 66 y.o. female  with history of limited systemic sclerosis, osteoarthritis, DDD, Raynauds, fibromyalgia, anemia, anxiety, bipolar disorder, GERD, and chronic pain on daily opioids who presents for evaluation after apparent syncopal episode and fall last night.  Patient states she remembers waking up to go to the bathroom around 1 AM feeling dizzy on the way to the bathroom.  She states he was able to urinate and was walking back to her bed when she think she felt she woke up on the floor with some neck pain.  She states it radiates towards the top of her upper back as well.  She denies any other acute pain including any change in her lower back pain, headache, eye pain, earache, sore throat or any extremity pain or abdominal pain or chest pain.  No recent fevers, cough, shortness of breath, nausea, vomiting, diarrhea or urinary symptoms.  Patient endorses chronic constipation issues related to her chronic opioid use and states she took some milk of magnesia last night as she had not had a bowel movement in 2 days.  No recent alcohol, illicit drugs or tobacco.  No other recent syncopal episodes.  Patient denies any history of seizures.  No associated incontinence patient denies any new numbness weakness or tingling in any extremity. ? ?  ?Past Medical History:  ?Diagnosis Date  ? Abdominal pain last 4 months  ? and nausea also  ? Allergy   ? Anemia   ? history of  ? Anxiety   ? Bipolar disorder ([place])   ? atpical bipolar disorder  ? 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 hx. C6- C7 -hx. past fusion(bone graft used)  ? Cholecystitis   ? Chronic pain   ? DDD (degenerative disc disease), lumbar 09/2015  ? dextroscoliosis with multilevel DDD and facet arthrosis most notable for R  foraminal disc protrusion L4/5 producing severe R neural foraminal stenosis abutting R L4 nerve root, moderate spinal canal and mild lat recesss and R neural foraminal stenosis L3/4 (MRI)  ? Depression   ? bipolar depression  ? Disorders of porphyrin metabolism   ? Felon of finger of left hand 11/10/2016  ? Fibromyalgia   ? GERD (gastroesophageal reflux disease)   ? Headache   ? occasionally  ? Internal hemorrhoids   ? Interstitial cystitis 06/06/2012  ? hx.  ? Irritable bowel syndrome   ? PONV (postoperative nausea and vomiting)   ? now uses stomach blockers and no ponv  ? 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 Evaluated in Pulmonary clinic/ [facility]/ [facility] /  02/07/12 > referred to Health Dept 02/10/2012     - POS GOLD    01/31/2012    ? Raynauds disease   ? hx.  ? Scleroderma ([place]) 04/24/2020  ? Seronegative arthritis   ? [name]  ? 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 hereditary macular degeneration (Dr Baird Cancer retinologist)  ? ? ? ?Physical Exam  ?Triage Vital Signs: ?ED Triage Vitals  ?Enc Vitals Group  ?   BP   ?   Pulse   ?   Resp   ?   Temp   ?   Temp src   ?   SpO2   ?   Weight   ?  Height   ?   Head Circumference   ?   Peak Flow   ?   Pain Score   ?   Pain Loc   ?   Pain Edu?   ?   Excl. in [place]?   ? ? ?Most recent vital signs: ?Vitals:  ? 10/29/21 1415 10/29/21 1453  ?BP: 127/70 122/76  ?Pulse: 75 78  ?Resp: 16 16  ?Temp:  98 ?F (36.7 ?C)  ?SpO2: 100% 98%  ? ? ?General: Awake, no distress.  ?CV:  Good peripheral perfusion.  2+ radial pulse. ?Resp:  Normal effort.  Clear bilaterally. ?Abd:  No distention.  Soft. ?Other:   ?CN II-XII grossly intact.  No pronator drift.  No finger dysmetria. ? ?Mild tenderness along the C and upper T-spine without any tenderness along the lower T or L-spine. ? ?No focal TTP over b/l shoulders, elbows, wrists, hips, knees, ankles ? ?2+ b/l radial and PD pulses  ? ?No other obvious trauma to face, scalp ,head, neck or torso.;  Patient has full strength and range of  motion throughout the bilateral upper and lower extremities.  He does have dry mucous membranes. ? ? ? ?ED Results / Procedures / Treatments  ?Labs ?(all labs ordered are listed, but only abnormal results are displayed) ?Labs Reviewed  ?COMPREHENSIVE METABOLIC PANEL - Abnormal; Notable for the following components:  ?    Result Value  ? Sodium 134 (*)   ? Glucose, Bld 122 (*)   ? Calcium 8.6 (*)   ? Total Protein 6.4 (*)   ? All other components within normal limits  ?MAGNESIUM - Abnormal; Notable for the following components:  ? Magnesium 2.6 (*)   ? All other components within normal limits  ?MAGNESIUM - Abnormal; Notable for the following components:  ? Magnesium 2.6 (*)   ? All other components within normal limits  ?CBC WITH DIFFERENTIAL/PLATELET  ?PROTIME-INR  ?APTT  ?URINALYSIS, COMPLETE (UACMP) WITH MICROSCOPIC  ?PREPARE PLATELET PHERESIS  ?ABO/RH  ?TYPE AND SCREEN  ? ? ? ?EKG ? ?EKG is remarkable for sinus rhythm with a ventricular rate of 75, normal axis, unremarkable intervals without evidence of acute ischemia or significant arrhythmia. ? ? ?RADIOLOGY ?CT head reviewed by myself without evidence of skull fracture, hemorrhage, edema, mass effect or other clear acute intracranial process.  I also reviewed radiologist dictation and agree with the findings of same. ? ?CT C-spine reviewed by myself shows apparent fracture through C5 and C6.  I also discussed this with reading radiologist Dr. Shon Baton agree with interpretation of 3 column fracture through C5-6 with arthrodesis with ligamentum flavum injury and kyphotic deformity as well as some dorsal epidural hematoma and flattening of the cord at C5 and C6.  There is arthrodesis from C3-C7. ? ?Chest reviewed by myself shows no focal consoidation, effusion, edema, pneumothorax or other clear acute thoracic process. I also reviewed radiology interpretation and agree with findings described. ? ?Plain film of the T-spine on my interpretation without evidence of  fracture or dislocation.  I also reviewed radiology interpretation. ? ?MR C-spine reviewed by myself shows previously noted 3 column fracture in the setting of previous C3-C7 ACDF and what appears to be an epidural hematoma.  I also viewed radiology's findings and agree with their interpretation of likely ligamentous injury as well. ? ?PROCEDURES: ? ?Critical Care performed: Yes, see critical care procedure note(s) ? ?.Critical Care ?Performed by: Lucrezia Starch, MD ?Authorized by: Lucrezia Starch, MD  ? ?Critical  care provider statement:  ?  Critical care time (minutes):  30 ?  Critical care was necessary to treat or prevent imminent or life-threatening deterioration of the following conditions:  Trauma ?  Critical care was time spent personally by me on the following activities:  Development of treatment plan with patient or surrogate, discussions with consultants, evaluation of patient's response to treatment, examination of patient, ordering and review of laboratory studies, ordering and review of radiographic studies, ordering and performing treatments and interventions, pulse oximetry, re-evaluation of patient's condition and review of old charts ?.1-3 Lead EKG Interpretation ?Performed by: Lucrezia Starch, MD ?Authorized by: Lucrezia Starch, MD  ? ?  Interpretation: normal   ?  ECG rate assessment: normal   ?  Rhythm: sinus rhythm   ?  Ectopy: none   ?  Conduction: normal   ? ?The patient is on the cardiac monitor to evaluate for evidence of arrhythmia and/or significant heart rate changes. ? ? ?MEDICATIONS ORDERED IN ED: ?Medications  ?lidocaine (LIDODERM) 5 % 1 patch (1 patch Transdermal Patch Applied 10/29/21 1024)  ?0.9 %  sodium chloride infusion (Manually program via Guardrails IV Fluids) (0 mLs Intravenous Hold 10/29/21 1036)  ?lactated ringers bolus 1,000 mL (0 mLs Intravenous Stopped 10/29/21 1137)  ?morphine (PF) 4 MG/ML injection 4 mg (4 mg Intravenous Given 10/29/21 1028)  ?ondansetron (ZOFRAN)  injection 4 mg (4 mg Intravenous Given 10/29/21 1141)  ?morphine (PF) 4 MG/ML injection 4 mg (4 mg Intravenous Given 10/29/21 1410)  ? ? ? ?IMPRESSION / MDM / ASSESSMENT AND PLAN / ED COURSE  ?I reviewed the triage v

## 2021-10-29 NOTE — ED Notes (Signed)
Paper blood consent signed by pt and myself and placed with chart.  ?

## 2021-10-29 NOTE — ED Notes (Signed)
Pt advised RN that she has to urinate. Pt ambulated to toilet with a  steady gait. RN placed urine collector, hat, in toilet to get urine sample. Pt urinated. RN went collected urine from hat and no urine was present in hat. Urine was present in toilet. RN notified provider. ?

## 2021-10-29 NOTE — ED Triage Notes (Signed)
Pt states she got dizzy and passed out last night, having neck and upper back pain. Pt is a/ox4 on arrival ?

## 2021-10-29 NOTE — ED Notes (Signed)
Pt gave urine on 10/27/21 at her primary's office. ?

## 2021-10-29 NOTE — ED Notes (Signed)
Blood consent form was signed by patient and previous nurse on paper form.  ?

## 2021-10-29 NOTE — Consult Note (Signed)
Neurosurgery-New Consultation Evaluation ?10/29/2021 ?Rachel Duran [id number] ? ?Identifying Statement: ?Rachel Duran is a 66 y.o. female from [place] [id number] with  ? ?Physician Requesting Consultation: No ref. provider found ? ?History of Present Illness: ?66 y.o. female  with history of limited systemic sclerosis, osteoarthritis, DDD, Raynauds, fibromyalgia, anemia, anxiety, bipolar disorder, GERD, and chronic pain on daily opioids who presents for evaluation after apparent syncopal episode and fall last night. ?She apparently suffered a syncopal event found herself lying on the floor with neck pain.  She denies any radicular symptoms no radicular pain down her upper or lower extremities noted no difficulty with upper or lower extremity sensory or motor function and no loss of bowel or bladder function. ?She presented to the emergency room for further evaluation. ? ?Currently she endorses only neck pain and interscapular pain and tightness which she notes has been progressive over several months.  She denies any radicular pain down her upper or lower extremity she denies any numbness or tingling in her upper or lower extremities she denies any bowel or bladder difficulties and she has been ambulating without difficulty. ? ?Past Medical History:  ?Past Medical History:  ?Diagnosis Date  ? Abdominal pain last 4 months  ? and nausea also  ? Allergy   ? Anemia   ? history of  ? Anxiety   ? Bipolar disorder ([place])   ? atpical bipolar disorder  ? 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 hx. C6- C7 -hx. past fusion(bone graft used)  ? Cholecystitis   ? Chronic pain   ? DDD (degenerative disc disease), lumbar 09/2015  ? dextroscoliosis with multilevel DDD and facet arthrosis most notable for R foraminal disc protrusion L4/5 producing severe R neural foraminal stenosis abutting R L4 nerve root, moderate spinal canal and mild lat recesss and R neural foraminal stenosis L3/4 (MRI)  ?  Depression   ? bipolar depression  ? Disorders of porphyrin metabolism   ? Felon of finger of left hand 11/10/2016  ? Fibromyalgia   ? GERD (gastroesophageal reflux disease)   ? Headache   ? occasionally  ? Internal hemorrhoids   ? Interstitial cystitis 06/06/2012  ? hx.  ? Irritable bowel syndrome   ? PONV (postoperative nausea and vomiting)   ? now uses stomach blockers and no ponv  ? 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 Evaluated in Pulmonary clinic/ [facility]/ [facility] /  02/07/12 > referred to Health Dept 02/10/2012     - POS GOLD    01/31/2012    ? Raynauds disease   ? hx.  ? Scleroderma ([place]) 04/24/2020  ? Seronegative arthritis   ? [name]  ? 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 hereditary macular degeneration (Dr Baird Cancer retinologist)  ? ? ?Social History: ?Social History  ? ?Socioeconomic History  ? Marital status: Married  ?  Spouse name: Not on file  ? Number of children: Not on file  ? Years of education: Not on file  ? Highest education level: Not on file  ?Occupational History  ? Occupation: retired  ?  Employer: UNEMPLOYED  ?Tobacco Use  ? Smoking status: Never  ?  Passive exposure: Never  ? Smokeless tobacco: Never  ?Vaping Use  ? Vaping Use: Never used  ?Substance and Sexual Activity  ? Alcohol use: No  ?  Alcohol/week: 0.0 standard drinks  ? Drug use: No  ? Sexual activity: Yes  ?  Partners: Male  ?  Birth control/protection: Post-menopausal  ?  Comment: vasectomy  ?Other Topics Concern  ? Not on file  ?Social History Narrative  ? Lives with husband and dog  ? Occupation: on disability since 2004, prior worked for [employer] office  ? Activity: tries to walk dog (45 min 3x/wk), yoga  ? Diet: good water, fruits/vegetables daily  ?   ? Rheum: [name]  ? Psychiatrist: [facility]  ? Surgery: [facility]  ? GI: [facility]  ? Urology: Amalia Hailey  ? ?Social Determinants of Health  ? ?Financial Resource Strain: Not on file  ?Food Insecurity: Not on file  ?Transportation Needs: Not on file  ?Physical Activity: Not  on file  ?Stress: Not on file  ?Social Connections: Not on file  ?Intimate Partner Violence: Not on file  ? ?Living arrangements (living alone, with partner): Lives at home ? ?Family History: ?Family History  ?Problem Relation Age of Onset  ? CAD Father 50  ?     MI, nonsmoker  ? Esophageal cancer Father 32  ? Stomach cancer Father   ? Scleroderma Mother   ? Hypertension Mother   ? Esophageal cancer Paternal Grandfather   ? Stomach cancer Paternal Grandfather   ? Diabetes Maternal Grandmother   ? Arthritis Brother   ? Stroke Neg Hx   ? Colon cancer Neg Hx   ? Rectal cancer Neg Hx   ? ? ?Review of Systems: ? ?Review of Systems - General ROS: Negative ?Psychological ROS: Negative ?Ophthalmic ROS: Negative ?ENT ROS: Negative ?Hematological and Lymphatic ROS: Negative  ?Endocrine ROS: Negative ?Respiratory ROS: Negative ?Cardiovascular ROS: Negative ?Gastrointestinal ROS: Negative ?Genito-Urinary ROS: Negative ?Musculoskeletal ROS: Negative ?Neurological ROS: Negative ?Dermatological ROS: Negative ? ?Physical Exam: ?BP 123/83   Pulse 84   Temp 98.1 ?F (36.7 ?C) (Oral)   Resp 18   LMP 07/25/1998   SpO2 97%  There is no height or weight on file to calculate BMI. There is no height or weight on file to calculate BSA. ?General appearance: Alert, cooperative, in no acute distress ?Head: Normocephalic, atraumatic ?Eyes: Normal, EOM intact ?Oropharynx: Moist without lesions ?Neck: Supple, no tenderness ?Heart: Normal, regular rate and rhythm, without murmur ?Lungs: Clear to auscultation, good air exchange ?Abdomen: Soft, nondistended ?Ext: No edema in LE bilaterally, good distal pulses ? ?Neurologic exam:  ?Mental status: alertness: alert, orientation: person, place, time, affect: normal ?Speech: fluent and clear ?Cranial nerves:  ?II: Visual fields are full by confrontation, no ptosis ?III/IV/VI: extra-ocular motions intact bilaterally ?V/VII:no evidence of facial droop or weakness and facial sensation intact ?VIII:  hearing normal ?XI: trapezius strength symmetric,  sternocleidomastoid strength symmetric ?XII: tongue strength symmetric  ?Motor:strength symmetric 5/5, normal muscle mass and tone in all extremities and no pronator drift ?Sensory: intact to light touch in all extremities ?Reflexes: 2+ and symmetric bilaterally for arms and legs ?Coordination: intact finger to nose ?Gait: normal  ? ?Laboratory: ?Results for orders placed or performed during the hospital encounter of 10/29/21  ?CBC with Differential  ?Result Value Ref Range  ? WBC 8.5 4.0 - 10.5 K/uL  ? RBC 4.32 3.87 - 5.11 MIL/uL  ? Hemoglobin 12.2 12.0 - 15.0 g/dL  ? HCT 37.7 36.0 - 46.0 %  ? MCV 87.3 80.0 - 100.0 fL  ? MCH 28.2 26.0 - 34.0 pg  ? MCHC 32.4 30.0 - 36.0 g/dL  ? RDW 13.8 11.5 - 15.5 %  ? Platelets 266 150 - 400 K/uL  ? nRBC 0.0 0.0 - 0.2 %  ? Neutrophils Relative % 68 %  ? Neutro Abs  5.8 1.7 - 7.7 K/uL  ? Lymphocytes Relative 21 %  ? Lymphs Abs 1.8 0.7 - 4.0 K/uL  ? Monocytes Relative 7 %  ? Monocytes Absolute 0.6 0.1 - 1.0 K/uL  ? Eosinophils Relative 2 %  ? Eosinophils Absolute 0.2 0.0 - 0.5 K/uL  ? Basophils Relative 1 %  ? Basophils Absolute 0.1 0.0 - 0.1 K/uL  ? Immature Granulocytes 1 %  ? Abs Immature Granulocytes 0.06 0.00 - 0.07 K/uL  ?Comprehensive metabolic panel  ?Result Value Ref Range  ? Sodium 134 (L) 135 - 145 mmol/L  ? Potassium 4.5 3.5 - 5.1 mmol/L  ? Chloride 99 98 - 111 mmol/L  ? CO2 29 22 - 32 mmol/L  ? Glucose, Bld 122 (H) 70 - 99 mg/dL  ? BUN 10 8 - 23 mg/dL  ? Creatinine, Ser 0.58 0.44 - 1.00 mg/dL  ? Calcium 8.6 (L) 8.9 - 10.3 mg/dL  ? Total Protein 6.4 (L) 6.5 - 8.1 g/dL  ? Albumin 3.9 3.5 - 5.0 g/dL  ? AST 38 15 - 41 U/L  ? ALT 19 0 - 44 U/L  ? Alkaline Phosphatase 82 38 - 126 U/L  ? Total Bilirubin 0.9 0.3 - 1.2 mg/dL  ? GFR, Estimated >60 >60 mL/min  ? Anion gap 6 5 - 15  ?Magnesium  ?Result Value Ref Range  ? Magnesium 2.6 (H) 1.7 - 2.4 mg/dL  ?Magnesium  ?Result Value Ref Range  ? Magnesium 2.6 (H) 1.7 - 2.4 mg/dL   ?Protime-INR  ?Result Value Ref Range  ? Prothrombin Time 13.6 11.4 - 15.2 seconds  ? INR 1.1 0.8 - 1.2  ?APTT  ?Result Value Ref Range  ? aPTT 26 24 - 36 seconds  ?Prepare platelet pheresis  ?Result Value Ref Range  ? U

## 2021-11-01 LAB — BPAM PLATELET PHERESIS
Blood Product Expiration Date: 202304082359
Blood Product Expiration Date: 202304102359
ISSUE DATE / TIME: 202304071200
Unit Type and Rh: 5100
Unit Type and Rh: 6200

## 2021-11-01 LAB — PREPARE PLATELET PHERESIS
Unit division: 0
Unit division: 0

## 2021-11-02 NOTE — Telephone Encounter (Signed)
I returned patient's call.  Patient states she has an appointment at [facility] neurosurgery.  I advised her to hold off tadalafil as I do not want her to have any side effects from the medication until she recovers from the surgery.  We will discuss starting on tadalafil at the follow-up visit if needed.  I advised her to use hand warmers and gloves for raynaud's.  Patient voiced understanding.

## 2021-11-02 NOTE — Telephone Encounter (Signed)
Received fax from [place] that they have denied coverage of the Tadalafil.  ? ?Spoke with patient and advised. Patient states she is going to hold off on starting the medication. She blacked out and took a fall at 1:00 am on Friday morning. Patient states she fell on her back and she has a complex fracture in her neck. Patient was seen in the emergency room and has an appointment scheduled with a neurosurgeon. Patient states she has been advised she is going to need an extensive surgery and a lot of physical therapy.   ?

## 2021-11-03 ENCOUNTER — Other Ambulatory Visit: Payer: Self-pay | Admitting: Family Medicine

## 2021-11-03 ENCOUNTER — Encounter: Payer: Self-pay | Admitting: Family Medicine

## 2021-11-04 NOTE — Telephone Encounter (Signed)
Vit B12 last filled:  08/23/21, #3 mL ?Tizanidine last filled:  06/30/21, #30 ?Last OV:  09/21/21, AWV ?Next OV:  12/21/21, 3 mo chronic pain f/u ?

## 2021-11-04 NOTE — Telephone Encounter (Signed)
ERx 

## 2021-11-05 ENCOUNTER — Encounter: Payer: Self-pay | Admitting: Family Medicine

## 2021-11-05 ENCOUNTER — Telehealth: Payer: Self-pay

## 2021-11-05 NOTE — Telephone Encounter (Signed)
Received fax from [place] that patient needs PA for Adderall XR '30mg'$  ER capsules. PA completed on [email or web address] and their response was, "This medication or product is on your plan's list of covered drugs. Prior authorization is not required at this time. If your pharmacy has questions regarding the processing of your prescription, please have them call the [facility] pharmacy help desk at [phone]. **Please note: This request was submitted electronically. Formulary lowering, tiering exception, cost reduction and/or pre-benefit determination review (including prospective Medicare hospice reviews) requests cannot be requested using this method of submission. Providers contact us at [phone] for further assistance." I contacted [facility] pharmacy and they said it was from March, 28th 2023 and to disregard the request as it has been straightened out already and the patient has picked up the prescription.  ?

## 2021-11-09 MED ORDER — MORPHINE SULFATE 15 MG PO TABS: 15.0000 mg | ORAL_TABLET | Freq: Three times a day (TID) | ORAL | 0 refills | Status: DC | PRN

## 2021-11-09 NOTE — Telephone Encounter (Signed)
Name of Medication: Morphine ?Name of Pharmacy: [facility] ?Last Fill or Written Date and Quantity: 08/30/21, #90 ?Last Office Visit and Type: 09/21/21, AWV ?Next Office Visit and Type: 12/21/21, 3 mo chronic pain f/u ?Last Controlled Substance Agreement Date: 04/19/19 ?Last UDS: 04/19/19 ? ? ?

## 2021-11-09 NOTE — Telephone Encounter (Signed)
ERx 

## 2021-11-09 NOTE — Addendum Note (Signed)
Addended by: Brenton Grills on: 3/35/8251 89:84 AM ? ? Modules accepted: Orders ? ?

## 2021-11-15 ENCOUNTER — Other Ambulatory Visit: Payer: Self-pay | Admitting: Family Medicine

## 2021-11-15 NOTE — Telephone Encounter (Signed)
Name of Medication: Adderall XR ?Name of Pharmacy: [facility] ?Last Fill or Written Date and Quantity: 10/19/21, #30 ?Last Office Visit and Type: 09/21/21, AWV ?Next Office Visit and Type: 12/21/21, 3 mo pain f/u ?Last Controlled Substance Agreement Date: 04/19/19 ?Last UDS: 04/19/19 ? ? ?

## 2021-11-15 NOTE — Telephone Encounter (Signed)
ERx 

## 2021-11-23 ENCOUNTER — Other Ambulatory Visit: Payer: Self-pay | Admitting: Obstetrics and Gynecology

## 2021-11-23 ENCOUNTER — Other Ambulatory Visit: Payer: Self-pay | Admitting: Family Medicine

## 2021-11-23 DIAGNOSIS — Z1231 Encounter for screening mammogram for malignant neoplasm of breast: Secondary | ICD-10-CM

## 2021-11-24 NOTE — Telephone Encounter (Signed)
Last OV 09-21-21 ?Next OV 12-21-21 ?

## 2021-11-26 ENCOUNTER — Encounter: Payer: Self-pay | Admitting: Family Medicine

## 2021-12-07 ENCOUNTER — Other Ambulatory Visit: Payer: Self-pay | Admitting: Gastroenterology

## 2021-12-07 ENCOUNTER — Other Ambulatory Visit: Payer: Self-pay | Admitting: Neurosurgery

## 2021-12-07 ENCOUNTER — Other Ambulatory Visit: Payer: Self-pay | Admitting: Physician Assistant

## 2021-12-07 NOTE — Telephone Encounter (Signed)
Next Visit: 12/28/2021 ? ?Last Visit: 10/27/2021 ? ?Last Fill: 08/30/2021 ? ?Dx: [name] disease without gangrene ? ?Current Dose per office note on 10/27/2021: amlodipine 7.'5mg'$  by mouth daily  ? ?Okay to refill Amlodipine?   ?

## 2021-12-08 ENCOUNTER — Telehealth: Payer: Self-pay | Admitting: Gastroenterology

## 2021-12-08 NOTE — Telephone Encounter (Signed)
Patient calling regarding prescription for Amlodipine. Patient states that [facility] pharmacy has not received prescription. Please call to advise. ?

## 2021-12-09 MED ORDER — PANTOPRAZOLE SODIUM 40 MG PO TBEC: DELAYED_RELEASE_TABLET | ORAL | 0 refills | Status: DC

## 2021-12-09 MED ORDER — URSODIOL 300 MG PO CAPS: 300.0000 mg | ORAL_CAPSULE | Freq: Two times a day (BID) | ORAL | 0 refills | Status: DC

## 2021-12-09 NOTE — Telephone Encounter (Signed)
Patient states she needs refills on Actigall and pantoprazole, not amlodipine. Patient states she is having spine surgery in June and will be in recovery for several months. Patient states she cannot come into the office until August or September to follow-up with [facility] but has a note in her calender to call in early August for an appt. Informed patient that we understand and I will refill her medications to her pharmacy.

## 2021-12-13 ENCOUNTER — Other Ambulatory Visit: Payer: Self-pay | Admitting: Neurosurgery

## 2021-12-14 ENCOUNTER — Other Ambulatory Visit: Payer: Self-pay | Admitting: Physician Assistant

## 2021-12-14 ENCOUNTER — Other Ambulatory Visit: Payer: Self-pay | Admitting: Family Medicine

## 2021-12-14 NOTE — Telephone Encounter (Signed)
Next Visit: 12/28/2021   Last Visit: 10/27/2021   Last Fill: 06/16/2021  Dx: Fibromyalgia   Current Dose per office note on 10/27/2021: Lyrica 150 mg 1 capsule twice daily.  Okay to refill Lyrica?

## 2021-12-14 NOTE — Telephone Encounter (Signed)
Name of Medication: Adderall XR Name of Pharmacy: [facility] or Written Date and Quantity: 11/16/21, #30 Last Office Visit and Type: 09/21/21, AWV Next Office Visit and Type: 12/21/21, chronic pain f/u Last Controlled Substance Agreement Date: 04/19/19 Last UDS: 04/19/19

## 2021-12-15 ENCOUNTER — Encounter: Payer: Self-pay | Admitting: Family Medicine

## 2021-12-15 ENCOUNTER — Telehealth: Payer: Self-pay | Admitting: Family Medicine

## 2021-12-15 NOTE — Telephone Encounter (Signed)
Spoke with pt offering OV for current sxs.  Pt agreed and scheduled OV tomorrow with Dr. Glori Bickers at 11:30 pending home COVID test results.  Pt will send MyChart message with results.  If pos, appt needs to be changed to MyChart video visit.

## 2021-12-15 NOTE — Telephone Encounter (Signed)
Pt wanted to leave the nurse a message: "She stated she had a fall on April 7th, Dr. Danise Mina is aware of this. Pt has these symptoms recently: sore throat, tenderness, stuffy ears, running nose, but no fever."  "Pt has a surgery coming up on June 7th, does she need to come in to be seen before then." Please return a call when possible.   Callback Number: [phone]

## 2021-12-16 ENCOUNTER — Ambulatory Visit (INDEPENDENT_AMBULATORY_CARE_PROVIDER_SITE_OTHER): Payer: Medicare Other | Admitting: Family Medicine

## 2021-12-16 ENCOUNTER — Encounter: Payer: Self-pay | Admitting: Family Medicine

## 2021-12-16 VITALS — BP 132/74 | HR 104 | Temp 98.0°F | Ht 63.0 in | Wt 133.4 lb

## 2021-12-16 DIAGNOSIS — J029 Acute pharyngitis, unspecified: Secondary | ICD-10-CM

## 2021-12-16 DIAGNOSIS — J020 Streptococcal pharyngitis: Secondary | ICD-10-CM

## 2021-12-16 LAB — POCT RAPID STREP A (OFFICE): Rapid Strep A Screen: POSITIVE — AB

## 2021-12-16 MED ORDER — AMOXICILLIN-POT CLAVULANATE 875-125 MG PO TABS: 1.0000 | ORAL_TABLET | Freq: Two times a day (BID) | ORAL | 0 refills | Status: DC

## 2021-12-16 NOTE — Progress Notes (Signed)
Subjective:    Patient ID: Rachel Duran, female    DOB: Nov 22, 1955, 66 y.o.   MRN: [id number]  HPI 66 yo pt of Dr Darnell Level presents for ST and uri symptoms  She has h/o DDD, chronic pain and chronic fatigue syndrome  Wt Readings from Last 3 Encounters:  12/16/21 133 lb 6 oz (60.5 kg)  10/27/21 134 lb 9.6 oz (61.1 kg)  09/21/21 131 lb 8 oz (59.6 kg)   23.63 kg/m  Symptoms started tues am  Throat and ear pain  Has a headache  No sinus pain   Congestion with green nasal mucous Glands are swollen /tender  No fever  No chills or body aches   Throat is pretty bad , hurts to swallow but she is able to  No rash   She had her tonsils out   No tick bites    Allergies have not been bad    Did covid test at home/negative   Has surgery planned June 7 Fractured neck in April, has to have surgery to repair   Had strep in January-treated   Results for orders placed or performed in visit on 12/16/21  Rapid Strep A  Result Value Ref Range   Rapid Strep A Screen Positive (A) Negative   *Note: Due to a large number of results and/or encounters for the requested time period, some results have not been displayed. A complete set of results can be found in Results Review.    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   Chronic patellofemoral pain of right knee 05/05/2020   Positive ANA (antinuclear antibody) 06/05/2019   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   Unilateral primary osteoarthritis, left hip 01/28/2019   Pelvic pain 12/12/2018   Common bile duct (CBD) obstruction    Venous insufficiency of left lower extremity 07/04/2018    Dyslipidemia 09/22/2017   DDD (degenerative disc disease), cervical 04/03/2017   DDD (degenerative disc disease), lumbar 04/03/2017   Onychomycosis 03/21/2017   Dysphagia 10/18/2016   Biliary stasis 09/26/2016   HNP (herniated nucleus pulposus), lumbar 09/23/2015   Iron deficiency 07/30/2015   Health maintenance examination 06/15/2015   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   Clavicle enlargement 12/13/2014   Prediabetes 12/13/2014   Plantar fasciitis, bilateral 09/11/2014   Medicare annual wellness visit, subsequent 06/10/2014   Abnormal thyroid function test 06/10/2014   Chronic pain syndrome 06/10/2014   CFS (chronic fatigue syndrome) 04/08/2014   Postmenopausal atrophic vaginitis 10/19/2012   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   Cervical disc disorder with radiculopathy of cervical region 05/28/2010   APHTHOUS ULCERS 01/31/2008   Chronic insomnia 11/09/2007   Drug-induced constipation 10/11/2007   Allergic rhinitis 04/12/2007   MDD (major depressive disorder), recurrent episode, moderate ([place]) 03/05/2007   [name] syndrome 03/05/2007   GERD 03/05/2007   ROSACEA 03/05/2007   NEURALGIA 03/05/2007   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 C6- C7 -hx. past fusion(bone graft used)   Cholecystitis    Chronic pain    DDD (degenerative disc disease), lumbar 09/2015   dextroscoliosis with multilevel DDD and facet arthrosis most notable for R foraminal disc protrusion L4/5 producing severe R neural foraminal stenosis abutting R L4 nerve root, moderate spinal canal and mild lat recesss and R neural foraminal stenosis L3/4 (MRI)   Depression     bipolar depression   Disorders of porphyrin metabolism    Felon of finger of left hand 11/10/2016   Fibromyalgia    GERD (gastroesophageal reflux disease)    Headache    occasionally   Internal hemorrhoids    Interstitial cystitis 06/06/2012   hx.   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 Cancer retinologist)   Past Surgical History:  Procedure Laterality Date   ANTERIOR CERVICAL DECOMP/DISCECTOMY FUSION  2004   C5/6, C6/7   ANTERIOR CERVICAL DECOMP/DISCECTOMY FUSION  02/2016   C3/4, C4/5 with plating [name])   AUGMENTATION MAMMAPLASTY Bilateral 03/25/2010   BREAST ENHANCEMENT SURGERY  2010   BREAST IMPLANT EXCHANGE  10/2014   exchange saline implants, B mastopexy/capsulorraphy ([facility])   BUNIONECTOMY Bilateral yrs ago   [place]   x 1   CHOLECYSTECTOMY  06/11/2012   Procedure: LAPAROSCOPIC CHOLECYSTECTOMY WITH INTRAOPERATIVE CHOLANGIOGRAM;  Surgeon: [name], MD,FACS;  Location: [facility];  Service: General;  Laterality: N/A;   COLONOSCOPY  02/2018   done for positive cologuard - WNL, rpt 10 yrs Fuller Plan)   CYSTOSCOPY     ERCP  05/22/2012   Procedure: ENDOSCOPIC RETROGRADE CHOLANGIOPANCREATOGRAPHY (ERCP);  Surgeon: [name], MD,FACG;  Location: [facility];  Service: Endoscopy;  Laterality: N/A;   ERCP N/A 09/17/2013   Procedure: ENDOSCOPIC RETROGRADE CHOLANGIOPANCREATOGRAPHY (ERCP);  Surgeon: [name], MD;  Location: [facility];  Service: Endoscopy;  Laterality: N/A;   ERCP N/A 09/27/2018   Procedure: ENDOSCOPIC RETROGRADE  CHOLANGIOPANCREATOGRAPHY (ERCP);  Surgeon: [name], MD;  Location: [facility];  Service: Endoscopy;  Laterality: N/A;   ESOPHAGOGASTRODUODENOSCOPY  09/2016   WNL. esophagus dilated Fuller Plan)   HEMORRHOID BANDING  09-23-13   --Dr. Greer Pickerel   HERNIA REPAIR     inguinal   HYSTEROSCOPY W/ ENDOMETRIAL ABLATION     KNEE ARTHROSCOPY Right 04/06/2021   Procedure: RIGHT KNEE ARTHROSCOPY WITH DEBRIDEMENT;  Surgeon: [name], MD;  Location: [place];  Service: Orthopedics;  Laterality: Right;   NASAL SINUS SURGERY     x5   PARTIAL KNEE ARTHROPLASTY Right 06/01/2020   Procedure: Right knee patellofemoral replacement;  Surgeon: [name], MD;  Location: [place];  Service: Orthopedics;  Laterality: Right;   REMOVAL OF STONES  09/27/2018   Procedure: REMOVAL OF STONES;  Surgeon: [name], MD;  Location: [facility];  Service: Endoscopy;;  SPHINCTEROTOMY  09/27/2018   Procedure: SPHINCTEROTOMY;  Surgeon: [name], MD;  Location: [facility];  Service: Endoscopy;;   TOTAL HIP ARTHROPLASTY Left 03/12/2019   Procedure: LEFT TOTAL HIP ARTHROPLASTY ANTERIOR APPROACH;  [name], [name], MD)   UPPER GASTROINTESTINAL ENDOSCOPY     Social History   Tobacco Use   Smoking status: Never    Passive exposure: Never   Smokeless tobacco: Never  Vaping Use   Vaping Use: Never used  Substance Use Topics   Alcohol use: No    Alcohol/week: 0.0 standard drinks   Drug use: No   Family History  Problem Relation Age of Onset   CAD Father 64       MI, nonsmoker   Esophageal cancer Father 74   Stomach cancer Father    Scleroderma Mother    Hypertension Mother    Esophageal cancer Paternal Grandfather    Stomach cancer Paternal Grandfather    Diabetes Maternal Grandmother    Arthritis Brother    Stroke Neg Hx    Colon cancer Neg Hx    Rectal cancer Neg Hx    Allergies  Allergen Reactions   Celebrex [Celecoxib] Rash     Headaches   Inh [Isoniazid] Other (See Comments)    Hepatitis   Cymbalta [Duloxetine Hcl] Other (See Comments)    Urinary retention   Nucynta [Tapentadol] Other (See Comments)    Agitation, jerking in legs   Silenor [Doxepin Hcl] Other (See Comments)    nightmare, dizziness, stumbling upon walking, not effective   Benzoin Dermatitis   Cephalexin Nausea And Vomiting   Fentanyl Other (See Comments)    Cold intolerance, arms twitching, insomnia, fatigue   Codeine Phosphate Nausea And Vomiting and Other (See Comments)    Severe n&v   Lithium Other (See Comments)    Sores in mouth    Meperidine Hcl Rash and Other (See Comments)    REACTION: red skin   Sulfamethoxazole Rash   Current Outpatient Medications on File Prior to Visit  Medication Sig Dispense Refill   ADDERALL XR 30 MG 24 hr capsule TAKE 1 CAPSUEL BY MOUTH EACH MORNING 30 capsule 0   ALPRAZolam (XANAX) 1 MG tablet Take 0.5 mg by mouth at bedtime.  0   amLODipine (NORVASC) 2.5 MG tablet TAKE ONE TABLET BY MOUTH DAILY **TAKE WITH '5MG'$  TABLET FOR DOSE OF 7.'5MG'$  DAILY** 90 tablet 0   amLODipine (NORVASC) 5 MG tablet Take 1 tablet (5 mg total) by mouth daily. 90 tablet 0   amoxicillin (AMOXIL) 500 MG tablet Take 4 tablets (2,'000mg'$ ) by mouth 30-60 minutes prior to dental cleaning. 4 tablet 0   ARIPiprazole (ABILIFY) 5 MG tablet Take 5 mg by mouth daily.     aspirin 81 MG chewable tablet Chew 1 tablet (81 mg total) by mouth daily. 30 tablet 0   Calcium Carbonate-Vitamin D (CALTRATE 600+D PO) Take by mouth daily.     cyanocobalamin (,VITAMIN B-12,) 1000 MCG/ML injection INJECT 1ML INTO THE MUSCLE EVERY 21 DAYSAS DIRECTED 3 mL 3   diclofenac Sodium (VOLTAREN) 1 % GEL APPLY TWO TO FOUR GRAMS TO AFFECTED JOINT UP TO FOUR TIMES A DAY 400 g 2   Eszopiclone 3 MG TABS TAKE 1 TABLET BY MOUTH AT BEDTIME TAKE IMMEDIATLEY BEFORE BEDTIME AS DIRECTED. 90 tablet 0   ferrous sulfate 325 (65 FE) MG tablet Take 325 mg by mouth every Monday, Wednesday,  and Friday.      fluticasone (FLONASE) 50 MCG/ACT nasal spray PLACE  2 SPRAYS INTO BOTH NOSTRILS DAILY AS NEEDED (SEASONAL ALLERGIES) 16 g 0   lamoTRIgine (LAMICTAL) 100 MG tablet Take 100 mg by mouth daily.     linaclotide (LINZESS) 290 MCG CAPS capsule Take 1 capsule (290 mcg total) by mouth daily. 90 capsule 1   morphine (MSIR) 15 MG tablet Take 1 tablet (15 mg total) by mouth 3 (three) times daily as needed for severe pain or moderate pain. 90 tablet 0   MOVANTIK 25 MG TABS tablet TAKE ONE TABLET BY MOUTH EVERY DAY 90 tablet 1   Multiple Vitamins-Minerals (PRESERVISION AREDS 2 PO) Take by mouth.     NONFORMULARY OR COMPOUNDED ITEM Testosterone propionate 2% in white petrolatum, apply small amount once daily for 5 days per week. 60 each 0   ondansetron (ZOFRAN) 4 MG tablet TAKE ONE TABLET EVERY EIGHT HOURS AS NEEDED FOR NAUSEA / VOMITING 30 tablet 0   ondansetron (ZOFRAN-ODT) 4 MG disintegrating tablet TAKE 1 TABLET BY MOUTH EVERY 8 HOURS AS NEEDED FOR NAUSEA OR VOMITING 20 tablet 1   pantoprazole (PROTONIX) 40 MG tablet TAKE ONE TABLET TWICE A DAY WITH MEALS 180 tablet 0   pentosan polysulfate (ELMIRON) 100 MG capsule Take 200 mg by mouth 2 (two) times daily.      phenazopyridine (PYRIDIUM) 100 MG tablet Take by mouth as needed.      polyethylene glycol (MIRALAX / GLYCOLAX) 17 g packet Take 17 g by mouth 3 (three) times daily.     Prasterone (INTRAROSA) 6.5 MG INST Place 1 suppository vaginally at bedtime. 84 each 3   pregabalin (LYRICA) 150 MG capsule TAKE 1 CAPSULE BY MOUTH TWICE DAILY 180 capsule 0   PREVIDENT 5000 BOOSTER PLUS 1.1 % PSTE See admin instructions.     senna (SENOKOT) 8.6 MG tablet Take 2 tablets by mouth daily as needed.      tiZANidine (ZANAFLEX) 2 MG tablet TAKE 1 TABLET BY MOUTH EVERY 8 HOURS AS NEEDED 30 tablet 0   ursodiol (ACTIGALL) 300 MG capsule Take 1 capsule (300 mg total) by mouth 2 (two) times daily. 180 capsule 0   No current facility-administered medications on  file prior to visit.    Review of Systems  Constitutional:  Positive for fatigue. Negative for activity change, appetite change, fever and unexpected weight change.  HENT:  Positive for congestion, sinus pressure and sore throat. Negative for ear pain, rhinorrhea, sinus pain, trouble swallowing and voice change.   Eyes:  Negative for pain, redness and visual disturbance.  Respiratory:  Negative for cough, shortness of breath and wheezing.   Cardiovascular:  Negative for chest pain and palpitations.  Gastrointestinal:  Negative for abdominal pain, blood in stool, constipation and diarrhea.  Endocrine: Negative for polydipsia and polyuria.  Genitourinary:  Negative for dysuria, frequency and urgency.  Musculoskeletal:  Negative for arthralgias, back pain and myalgias.  Skin:  Negative for pallor and rash.  Allergic/Immunologic: Negative for environmental allergies.  Neurological:  Positive for headaches. Negative for dizziness and syncope.  Hematological:  Negative for adenopathy. Does not bruise/bleed easily.  Psychiatric/Behavioral:  Negative for decreased concentration and dysphoric mood. The patient is not nervous/anxious.       Objective:   Physical Exam Constitutional:      General: She is not in acute distress.    Appearance: She is well-developed and normal weight. She is not ill-appearing.  HENT:     Head: Normocephalic and atraumatic.     Right Ear: Tympanic membrane and ear canal normal.  Left Ear: Tympanic membrane and ear canal normal.     Nose:     Comments: Boggy nares     Mouth/Throat:     Mouth: Mucous membranes are moist. No oral lesions.     Pharynx: Uvula midline. Posterior oropharyngeal erythema present. No oropharyngeal exudate or uvula swelling.     Tonsils: No tonsillar exudate or tonsillar abscesses.  Eyes:     Conjunctiva/sclera: Conjunctivae normal.  Cardiovascular:     Rate and Rhythm: Regular rhythm. Tachycardia present.     Heart sounds: Normal  heart sounds.  Musculoskeletal:     Cervical back: Normal range of motion and neck supple.  Lymphadenopathy:     Cervical: No cervical adenopathy.  Skin:    General: Skin is dry.     Findings: No rash.  Neurological:     Mental Status: She is alert.          Assessment & Plan:   Problem List Items Addressed This Visit       Respiratory   Strep throat    Pos strep test with ST and headache and malaise  tx with augmentin Fluids/rest/sympt care ER precautions rev   Also some purulent mucus from nose, if sinusitis this should also cover  If not improved by surgery date, will need to re schedule          Other Visit Diagnoses     Sore throat    -  Primary   Relevant Orders   Rapid Strep A (Completed)

## 2021-12-16 NOTE — Assessment & Plan Note (Signed)
Pos strep test with ST and headache and malaise  tx with augmentin Fluids/rest/sympt care ER precautions rev   Also some purulent mucus from nose, if sinusitis this should also cover  If not improved by surgery date, will need to re schedule

## 2021-12-16 NOTE — Telephone Encounter (Signed)
ERx 

## 2021-12-16 NOTE — Progress Notes (Signed)
Office Visit Note  Patient: Rachel Duran             Date of Birth: December 25, 1955           MRN: [id number]             PCP: Ria Bush, MD Referring: Ria Bush, MD Visit Date: 12/28/2021 Occupation: @GUAROCC @  Subjective:  Pain in joints (Having c-spine surgery 12/29/2021)   History of Present Illness: Christopher Hink is a 66 y.o. female with history of limited systemic sclerosis, osteoarthritis, degenerative disc disease and osteopenia.  She states she fell on October 29, 2021 in her bathroom backwards after a syncopal episode.  She was told that she had C3 and C4 spinal fracture.  She was placed in a c-collar.  She had previous spinal fusion by Dr. [name].  She saw Dr. [name] for the follow-up visit.  She is scheduled to have C-spine fusion tomorrow by Dr. [name].  Raynauds symptoms are better with the warmer weather.  She continues to have neck and lower back pain.  She has a stiffness in her hands due to underlying osteoarthritis.  Left total hip replacement is doing well.  She continues to have discomfort in her right knee joint was replaced.  There is no history of joint swelling.  Activities of Daily Living:  Patient reports morning stiffness for 2 hours.   Patient Reports nocturnal pain.  Difficulty dressing/grooming: Denies Difficulty climbing stairs: Reports Difficulty getting out of chair: Reports Difficulty using hands for taps, buttons, cutlery, and/or writing: Reports  Review of Systems  Constitutional:  Positive for fatigue.  HENT:  Positive for mouth dryness. Negative for mouth sores.   Eyes:  Positive for dryness.  Respiratory:  Negative for shortness of breath and difficulty breathing.   Cardiovascular:  Negative for chest pain, palpitations and swelling in legs/feet.  Gastrointestinal:  Positive for constipation. Negative for diarrhea.  Endocrine: Positive for cold intolerance.  Genitourinary:  Negative for difficulty urinating.   Musculoskeletal:  Positive for joint pain, gait problem, joint pain, muscle weakness, morning stiffness and muscle tenderness.  Skin:  Positive for color change. Negative for rash and sensitivity to sunlight.  Allergic/Immunologic: Negative for susceptible to infections.  Neurological:  Positive for numbness and weakness.  Hematological:  Negative for bruising/bleeding tendency and swollen glands.  Psychiatric/Behavioral:  Positive for sleep disturbance. Negative for depressed mood. The patient is not nervous/anxious.    PMFS History:  Patient Active Problem List   Diagnosis Date Noted   C5 cervical fracture ([place]) 12/21/2021   Advanced directives, counseling/discussion 09/23/2021   Strep throat 08/18/2021   Swelling of right index finger 07/14/2021   Synovitis of right knee    Scoliosis 01/13/2021   Osteopenia 07/02/2020   Status post left knee surgery 06/01/2020   Pulmonary nodule 05/27/2020   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[place]) 05/05/2020   Chronic patellofemoral pain of right knee 05/05/2020   Positive ANA (antinuclear antibody) 06/05/2019   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   Unilateral primary osteoarthritis, left hip 01/28/2019   Pelvic pain 12/12/2018   Common bile duct (CBD) obstruction    Venous insufficiency of left lower extremity 07/04/2018   Dyslipidemia 09/22/2017   DDD (degenerative disc disease), cervical 04/03/2017   DDD (degenerative disc disease), lumbar 04/03/2017   Onychomycosis 03/21/2017   Dysphagia 10/18/2016   Encounter for chronic pain management 10/18/2016   Biliary stasis 09/26/2016   HNP (herniated nucleus pulposus), lumbar 09/23/2015   Iron deficiency  07/30/2015   Health maintenance examination 06/15/2015   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   Clavicle enlargement 12/13/2014   Prediabetes 12/13/2014   Plantar fasciitis, bilateral 09/11/2014   Medicare annual wellness visit, subsequent 06/10/2014   Abnormal thyroid function test  06/10/2014   Chronic pain syndrome 06/10/2014   CFS (chronic fatigue syndrome) 04/08/2014   Postmenopausal atrophic vaginitis 10/19/2012   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   Cervical disc disorder with radiculopathy of cervical region 05/28/2010   APHTHOUS ULCERS 01/31/2008   Chronic insomnia 11/09/2007   Drug-induced constipation 10/11/2007   Allergic rhinitis 04/12/2007   MDD (major depressive disorder), recurrent episode, moderate ([place]) 03/05/2007   [name] syndrome 03/05/2007   GERD 03/05/2007   ROSACEA 03/05/2007   NEURALGIA 03/05/2007   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 C6- C7 -hx. past fusion(bone graft used)   Cholecystitis    Chronic pain    DDD (degenerative disc disease), lumbar 09/2015   dextroscoliosis with multilevel DDD and facet arthrosis most notable for R foraminal disc protrusion L4/5 producing severe R neural foraminal stenosis abutting R L4 nerve root, moderate spinal canal and mild lat recesss and R neural foraminal stenosis L3/4 (MRI)   Depression    bipolar depression   Disorders of porphyrin metabolism    Felon of finger of left hand 11/10/2016   Fibromyalgia    GERD (gastroesophageal reflux disease)    Headache    occasionally   Internal hemorrhoids    Interstitial cystitis 06/06/2012   hx.   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   Scleroderma ([place]) 04/24/2020   Seronegative arthritis    Camera Krienke   Stargardt's disease 05/2015   hereditary macular degeneration (Dr Baird Cancer retinologist)   Tuberculosis     Family History  Problem Relation Age of Onset   CAD Father 64       MI, nonsmoker   Esophageal cancer Father 98   Stomach cancer Father    Scleroderma Mother    Hypertension Mother    Esophageal cancer Paternal Grandfather    Stomach cancer Paternal Grandfather    Diabetes Maternal Grandmother    Arthritis Brother    Stroke Neg Hx    Colon cancer Neg Hx    Rectal cancer Neg Hx    Past Surgical History:  Procedure Laterality Date   ANTERIOR CERVICAL DECOMP/DISCECTOMY FUSION  2004   C5/6, C6/7   ANTERIOR CERVICAL DECOMP/DISCECTOMY FUSION  02/2016   C3/4, C4/5 with plating [name])   AUGMENTATION MAMMAPLASTY Bilateral 03/25/2010   BREAST ENHANCEMENT SURGERY  2010   BREAST IMPLANT EXCHANGE  10/2014   exchange saline implants, B mastopexy/capsulorraphy ([facility])   BUNIONECTOMY Bilateral yrs ago   [place]   x 1  CHOLECYSTECTOMY  06/11/2012   Procedure: LAPAROSCOPIC CHOLECYSTECTOMY WITH INTRAOPERATIVE CHOLANGIOGRAM;  Surgeon: [name], MD,FACS;  Location: [facility];  Service: General;  Laterality: N/A;   COLONOSCOPY  02/2018   done for positive cologuard - WNL, rpt 10 yrs Fuller Plan)   CYSTOSCOPY     ERCP  05/22/2012   Procedure: ENDOSCOPIC RETROGRADE CHOLANGIOPANCREATOGRAPHY (ERCP);  Surgeon: [name], MD,FACG;  Location: [facility];  Service: Endoscopy;  Laterality: N/A;   ERCP N/A 09/17/2013   Procedure: ENDOSCOPIC RETROGRADE CHOLANGIOPANCREATOGRAPHY (ERCP);  Surgeon: [name], MD;  Location: [facility];  Service: Endoscopy;  Laterality: N/A;   ERCP N/A 09/27/2018   Procedure: ENDOSCOPIC RETROGRADE CHOLANGIOPANCREATOGRAPHY (ERCP);  Surgeon: [name], MD;  Location: [facility];  Service: Endoscopy;  Laterality: N/A;   ESOPHAGOGASTRODUODENOSCOPY   09/2016   WNL. esophagus dilated Fuller Plan)   HEMORRHOID BANDING  09/23/2013   --Dr. Greer Pickerel   HERNIA REPAIR     inguinal   HYSTEROSCOPY W/ ENDOMETRIAL ABLATION     KNEE ARTHROSCOPY Right 04/06/2021   Procedure: RIGHT KNEE ARTHROSCOPY WITH DEBRIDEMENT;  Surgeon: [name], MD;  Location: [place];  Service: Orthopedics;  Laterality: Right;   NASAL SINUS SURGERY     x5   PARTIAL KNEE ARTHROPLASTY Right 06/01/2020   Procedure: Right knee patellofemoral replacement;  Surgeon: [name], MD;  Location: [place];  Service: Orthopedics;  Laterality: Right;   REMOVAL OF STONES  09/27/2018   Procedure: REMOVAL OF STONES;  Surgeon: [name], MD;  Location: [facility];  Service: Endoscopy;;   SPHINCTEROTOMY  09/27/2018   Procedure: SPHINCTEROTOMY;  Surgeon: [name], MD;  [facility];  Service: Endoscopy;;   TONSILLECTOMY     removed as a child   TOTAL HIP ARTHROPLASTY Left 03/12/2019   Procedure: LEFT TOTAL HIP ARTHROPLASTY ANTERIOR APPROACH;  [name], [name], MD)   UPPER GASTROINTESTINAL ENDOSCOPY     Social History   Social History Narrative   Lives with husband and dog   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   Activity: tries to walk dog (45 min 3x/wk), yoga   Diet: good water, fruits/vegetables daily      Rheum: Sherronda Sweigert   Psychiatrist: [employer] [facility]   Surgery: Redmond Pulling   GIFuller Plan   Urology: Amalia Hailey   Immunization History  Administered Date(s) Administered   Influenza Split 05/04/2011, 04/04/2012   Influenza Whole 05/25/2007, 04/28/2008, 04/30/2009, 03/26/2010   Influenza,inj,Quad PF,6+ Mos 04/16/2015, 04/15/2016, 05/16/2017, 04/10/2018, 04/19/2019, 04/07/2020   Influenza-Unspecified 03/25/2014   MMR 11/28/2017   Moderna Sars-Covid-2 Vaccination 09/02/2019, 09/30/2019, 04/07/2020   Pneumococcal Conjugate-13 08/02/2013   Pneumococcal Polysaccharide-23  07/30/2009   Td 11/01/2005, 11/10/2016   Zoster Recombinat (Shingrix) 07/21/2017, 11/03/2017     Objective: Vital Signs: BP 125/74 (BP Location: Left Arm, Patient Position: Sitting, Cuff Size: Normal)   Pulse 87   Resp 16   Ht 5' 3"  (1.6 m)   Wt 133 lb (60.3 kg)   LMP 07/25/1998   BMI 23.56 kg/m    Physical Exam Vitals and nursing note reviewed.  Constitutional:      Appearance: She is well-developed.  HENT:     Head: Normocephalic and atraumatic.  Eyes:     Conjunctiva/sclera: Conjunctivae normal.  Cardiovascular:     Rate and Rhythm: Normal rate and regular rhythm.     Heart sounds: Normal heart sounds.  Pulmonary:     Effort: Pulmonary effort is normal.  Breath sounds: Normal breath sounds.  Abdominal:     General: Bowel sounds are normal.     Palpations: Abdomen is soft.  Musculoskeletal:     Cervical back: Normal range of motion.  Lymphadenopathy:     Cervical: No cervical adenopathy.  Skin:    General: Skin is warm and dry.     Capillary Refill: Capillary refill takes less than 2 seconds.     Comments: Skin tightness was noted only proximal to PIP joints.  No nailbed capillary changes were noted today.  No digital ulcers were noted.  She had good capillary refill.  Neurological:     Mental Status: She is alert and oriented to person, place, and time.  Psychiatric:        Behavior: Behavior normal.     Musculoskeletal Exam: She had very limited lateral rotation and very limited extension.  She has severe thoracic kyphosis with thoracolumbar scoliosis.  Shoulder joints, elbow joints, wrist joints, MCPs PIPs and DIPs with good range of motion.  She had bilateral DIP thickening.  Hip joints were not limited range of motion.  Left hip joint is replaced.  Right knee joint is replaced.  She has some discomfort range of motion of her right knee joint.  There was no tenderness over ankles or MTPs.  CDAI Exam: CDAI Score: -- Patient Global: --; Provider Global:  -- Swollen: --; Tender: -- Joint Exam 12/28/2021   No joint exam has been documented for this visit   There is currently no information documented on the homunculus. Go to the Rheumatology activity and complete the homunculus joint exam.  Investigation: No additional findings.  Imaging: No results found.  Recent Labs: Lab Results  Component Value Date   WBC 6.8 12/22/2021   HGB 12.5 12/22/2021   PLT 301 12/22/2021   NA 136 12/22/2021   K 4.3 12/22/2021   CL 100 12/22/2021   CO2 29 12/22/2021   GLUCOSE 105 (H) 12/22/2021   BUN <5 (L) 12/22/2021   CREATININE 0.55 12/22/2021   BILITOT 0.9 10/29/2021   ALKPHOS 82 10/29/2021   AST 38 10/29/2021   ALT 19 10/29/2021   PROT 6.4 (L) 10/29/2021   ALBUMIN 3.9 10/29/2021   CALCIUM 9.3 12/22/2021   GFRAA >60 03/13/2019    Speciality Comments: No specialty comments available.      Procedures:  No procedures performed Allergies: Celebrex [celecoxib], Inh [isoniazid], Cymbalta [duloxetine hcl], Nucynta [tapentadol], Silenor [doxepin hcl], Benzoin, Cephalexin, Fentanyl, Codeine phosphate, Lithium, Meperidine hcl, and Sulfamethoxazole   Assessment / Plan:     Visit Diagnoses: 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[place]) - AVISE on 03/26/20: indeterminate index 0.0, ANA positive, no titer, weak positive anti-histone, rest of ENA panel negative, family history of scleroderma: She is clinically doing much better.  She states her Raynauds symptoms have improved remarkably.  She has been taking amlodipine 7.5 mg p.o. daily.  She did not take tadalafil.  She denies any digital ulcers or skin tightness today.  She had good capillary refill with no nailbed capillary changes or digital ulcers today.  She denies any shortness of breath or palpitations.  October 06, 2021 AVISE lupus index -1.1, ANA positive, titer negative, dsDNA equivocal, (RNP, Ro, SSB, Smith, centromere, SCL 70, RNA polymerase 3, Jo 1, CB Negative), anticardiolipin negative, beta-2 GP 1  negative, antiphosphatidylserine negative, RF negative, anti-CCP negative, anticarP negative, antihistone negative, antithyroglobulin negative, anti-TPO negative.  All the left findings were discussed with the patient. May 01, 2021 CT chest with contrast  no evidence of ILD, PFTs normal, aortic atherosclerosis and coronary artery calcification was noted. June 22, 2020 echocardiogram showed no evidence of pulmonary hypertension September 17, 2021 coronary calcium score 18  [name] disease without gangrene - amlodipine 7.18m by mouth daily.   Primary osteoarthritis of both hands-she has severe osteoarthritis in her hands with PIP and DIP thickening.  No synovitis was noted.  S/P hip replacement, left-she had limited range of motion with some discomfort.  Primary osteoarthritis of left knee-she denies any discomfort.  Status post right partial knee replacement-she has chronic pain.  DDD (degenerative disc disease), cervical-she has history of disc disease and had fusion in the past.  She had recent fall after a syncopal episode in April 2023 resulting into cervical spine fracture.  She is scheduled to have cervical spine fusion by Dr. JArnoldo Moraletomorrow.  She had very limited range of motion in her cervical spine.  DDD (degenerative disc disease), lumbar-she has chronic pain.  Other form of scoliosis of thoracolumbar spine  Fibromyalgia-she continues to have some generalized pain and discomfort.  Chronic pain syndrome  Osteopenia of neck of right femur-October 06, 2021 the BMD measured at [facility] is 0.704 g/cm2 with a T-score of-2.4, BMD 0.704.  Her DXA followed by her GYN.  I have reviewed her DEXA report with her today.  She is close to osteoporosis.  In my opinion she should start on bisphosphonates.  I will schedule an earlier appointment to discuss treatment options after her cervical spine surgery.  She has minimal reflux symptoms.  We can try Fosamax after the labs are obtained.   If she has intolerance to Fosamax and IV Reclast would be a good option.  I advised her to schedule an appointment with the dentist as well.  Other medical problems are listed as follows:  Other fatigue  Hx of porphyria  Prediabetes  History of bipolar disorder  History of gastroesophageal reflux (GERD)  CFS (chronic fatigue syndrome)  Family history of scleroderma  Orders: No orders of the defined types were placed in this encounter.  No orders of the defined types were placed in this encounter.   Face-to-face time spent with patient was 40 minutes. Greater than 50% of time was spent in counseling and coordination of care.  Follow-Up Instructions: No follow-ups on file.   SBo Merino MD  Note - This record has been created using DEditor, commissioning  Chart creation errors have been sought, but may not always  have been located. Such creation errors do not reflect on  the standard of medical care.

## 2021-12-16 NOTE — Patient Instructions (Signed)
Drink fluids Rest   Take the augmentin as directed with food  Chloraseptic products are helpful   Nasal saline is good for congestion   Update if not starting to improve in a week or if worsening    If symptoms get severe go to the ER If you cannot swallow for instance

## 2021-12-17 MED ORDER — AMPHETAMINE-DEXTROAMPHET ER 30 MG PO CP24: ORAL_CAPSULE | ORAL | 0 refills | Status: DC

## 2021-12-17 NOTE — Telephone Encounter (Signed)
See today's My Chart message.

## 2021-12-21 ENCOUNTER — Ambulatory Visit (INDEPENDENT_AMBULATORY_CARE_PROVIDER_SITE_OTHER): Payer: Medicare Other | Admitting: Family Medicine

## 2021-12-21 ENCOUNTER — Encounter: Payer: Self-pay | Admitting: Family Medicine

## 2021-12-21 VITALS — BP 136/82 | HR 105 | Temp 98.2°F | Ht 63.0 in | Wt 132.0 lb

## 2021-12-21 DIAGNOSIS — G8929 Other chronic pain: Secondary | ICD-10-CM

## 2021-12-21 DIAGNOSIS — S12490G Other displaced fracture of fifth cervical vertebra, subsequent encounter for fracture with delayed healing: Secondary | ICD-10-CM | POA: Diagnosis not present

## 2021-12-21 DIAGNOSIS — J020 Streptococcal pharyngitis: Secondary | ICD-10-CM

## 2021-12-21 DIAGNOSIS — M5030 Other cervical disc degeneration, unspecified cervical region: Secondary | ICD-10-CM

## 2021-12-21 DIAGNOSIS — K5903 Drug induced constipation: Secondary | ICD-10-CM | POA: Diagnosis not present

## 2021-12-21 DIAGNOSIS — M5010 Cervical disc disorder with radiculopathy, unspecified cervical region: Secondary | ICD-10-CM

## 2021-12-21 DIAGNOSIS — S12400A Unspecified displaced fracture of fifth cervical vertebra, initial encounter for closed fracture: Secondary | ICD-10-CM | POA: Insufficient documentation

## 2021-12-21 MED ORDER — ONDANSETRON HCL 4 MG PO TABS: ORAL_TABLET | ORAL | 0 refills | Status: DC

## 2021-12-21 NOTE — Addendum Note (Signed)
Addended by: Ria Bush on: 12/21/2021 10:21 PM   Modules accepted: Orders

## 2021-12-21 NOTE — Progress Notes (Signed)
Surgical Instructions    Your procedure is scheduled on Wednesday June 7th.  Report to [facility] Main Entrance "A" at 0730 A.M., then check in with the Admitting office.  Call this number if you have problems the morning of surgery:  [phone]   If you have any questions prior to your surgery date call [phone]: Open Monday-Friday 8am-4pm    Remember:  Do not eat or drink anything after midnight the night before your surgery     Take these medicines the morning of surgery with A SIP OF WATER: amLODipine (NORVASC) 7.5 MG tablet amoxicillin-clavulanate (AUGMENTIN) 875-125 MG tablet fluticasone (FLONASE) 50 MCG/ACT nasal spray lamoTRIgine (LAMICTAL) 200 MG tablet linaclotide (LINZESS) 290 MCG CAPS capsule pantoprazole (PROTONIX) 40 MG tablet pregabalin (LYRICA) 150 MG capsule ursodiol (ACTIGALL) 300 MG capsule  IF NEEDED  morphine (MSIR) 15 MG tablet ondansetron (ZOFRAN) 4 MG tablet sodium chloride (OCEAN) 0.65 % SOLN nasal spray tiZANidine (ZANAFLEX) 2 MG tablet  As of today, STOP taking any Aspirin (unless otherwise instructed by your surgeon) Voltaren, Aleve, Naproxen, Ibuprofen, Motrin, Advil, Goody's, BC's, all herbal medications, fish oil, and all vitamins.           Do not wear jewelry or makeup Do not wear lotions, powders, perfumes, or deodorant. Do not shave 48 hours prior to surgery.   Do not bring valuables to the hospital. Do not wear nail polish, gel polish, artificial nails, or any other type of covering on natural nails (fingers and toes) If you have artificial nails or gel coating that need to be removed by a nail salon, please have this removed prior to surgery. Artificial nails or gel coating may interfere with anesthesia's ability to adequately monitor your vital signs.  [facility] is not responsible for any belongings or valuables. .   Do NOT Smoke (Tobacco/Vaping)  24 hours prior to your procedure  If you use a CPAP at night, you may bring your  mask for your overnight stay.   Contacts, glasses, hearing aids, dentures or partials may not be worn into surgery, please bring cases for these belongings   For patients admitted to the hospital, discharge time will be determined by your treatment team.   Patients discharged the day of surgery will not be allowed to drive home, and someone needs to stay with them for 24 hours.   SURGICAL WAITING ROOM VISITATION Patients having surgery or a procedure in a hospital may have two support people. Children under the age of 76 must have an adult with them who is not the patient. They may stay in the waiting area during the procedure and may switch out with other visitors. If the patient needs to stay at the hospital during part of their recovery, the visitor guidelines for inpatient rooms apply.  Please refer to the [facility] website for the visitor guidelines for Inpatients (after your surgery is over and you are in a regular room).       Special instructions:    Oral Hygiene is also important to reduce your risk of infection.  Remember - BRUSH YOUR TEETH THE MORNING OF SURGERY WITH YOUR REGULAR TOOTHPASTE   - Preparing For Surgery  Before surgery, you can play an important role. Because skin is not sterile, your skin needs to be as free of germs as possible. You can reduce the number of germs on your skin by washing with CHG (chlorahexidine gluconate) Soap before surgery.  CHG is an antiseptic cleaner which kills germs and  bonds with the skin to continue killing germs even after washing.     Please do not use if you have an allergy to CHG or antibacterial soaps. If your skin becomes reddened/irritated stop using the CHG.  Do not shave (including legs and underarms) for at least 48 hours prior to first CHG shower. It is OK to shave your face.  Please follow these instructions carefully.     Shower the NIGHT BEFORE SURGERY and the MORNING OF SURGERY with CHG Soap.   If you  chose to wash your hair, wash your hair first as usual with your normal shampoo. After you shampoo, rinse your hair and body thoroughly to remove the shampoo.  Then [employer] and genitals (private parts) with your normal soap and rinse thoroughly to remove soap.  After that Use CHG Soap as you would any other liquid soap. You can apply CHG directly to the skin and wash gently with a scrungie or a clean washcloth.   Apply the CHG Soap to your body ONLY FROM THE NECK DOWN.  Do not use on open wounds or open sores. Avoid contact with your eyes, ears, mouth and genitals (private parts). Wash Face and genitals (private parts)  with your normal soap.   Wash thoroughly, paying special attention to the area where your surgery will be performed.  Thoroughly rinse your body with warm water from the neck down.  DO NOT shower/wash with your normal soap after using and rinsing off the CHG Soap.  Pat yourself dry with a CLEAN TOWEL.  Wear CLEAN PAJAMAS to bed the night before surgery  Place CLEAN SHEETS on your bed the night before your surgery  DO NOT SLEEP WITH PETS.   Day of Surgery:  Take a shower with CHG soap. Wear Clean/Comfortable clothing the morning of surgery Do not apply any deodorants/lotions.   Remember to brush your teeth WITH YOUR REGULAR TOOTHPASTE.    If you received a COVID test during your pre-op visit, it is requested that you wear a mask when out in public, stay away from anyone that may not be feeling well, and notify your surgeon if you develop symptoms. If you have been in contact with anyone that has tested positive in the last 10 days, please notify your surgeon.    Please read over the following fact sheets that you were given.

## 2021-12-21 NOTE — Assessment & Plan Note (Signed)
Continues daily linzess and movantik with PRN MOM/senokot/miralax

## 2021-12-21 NOTE — Assessment & Plan Note (Signed)
Recent fall at home with resultant fracture of C5 vertebra and kyphosis, planned neck surgery repair (C5/6 corpectomy with ACDF of cervical spine from C3-7.  She has upcoming preop evaluation scheduled tomorrow. Anticipate adequately low risk to proceed with planned surgical intervention.

## 2021-12-21 NOTE — Progress Notes (Addendum)
Patient ID: Rachel Duran, female    DOB: [birth year], 66 y.o.   MRN: [id number]  This visit was conducted in person.  BP 136/82   Pulse (!) 105   Temp 98.2 F (36.8 C) (Temporal)   Ht '5\' 3"'$  (1.6 m)   Wt 132 lb (59.9 kg)   LMP 07/25/1998   SpO2 95%   BMI 23.38 kg/m    CC: 3 mo chronic pain visit  Subjective:   HPI: Rachel Duran is a 66 y.o. female presenting on 12/21/2021 for Pain Management (Here for 3 mo chronic pain f/u.)   Longstanding chronic pain managed with opiate - current regimen is MSIR morphine '15mg'$  TID PRN #90/month (takes 2-3 tabs daily). Tolerates medication well without oversedation, gait unsteadiness. She also takes lyrica '150mg'$  BID and tizanidine '2mg'$  daily as needed.   Bowel regimen - movantik and linzess daily with good benefit, rare milk of magnesia PRN.   Recent strep throat last week, symptoms are improving over the past 2 days.  Fall 10/2021 due to syncopal episode while going to bathroom in the middle of the night, suffered displaced cervical fracture of C5 vertebra. Upcoming planned neck surgery C5/6 corpectomy and ACDF C3-7 [name]). This will likely bee a complicated surgery. This was first syncopal spell. She notes ongoing tingling/numbness to R>L arm as well as ongoing neck pain.   Told this is likely 8-9 hour procedure.  Preop visit scheduled for tomorrow at 1pm.  Latest surgery was R partial knee replacement 2022.  Denies chest pain, dyspnea, palpitations, headache, abd pain or dizziness.  No known difficulty waking up from anesthesia. Has tolerated gen anesthesia well in the past.  H/o post op nausea/vomiting, but has done better the last 3 surgeries.   Prior to neck fracture, she was walking 1/4-1/2 mile, 4 days a week.      Relevant past medical, surgical, family and social history reviewed and updated as indicated. Interim medical history since our last visit reviewed. Allergies and medications reviewed and  updated. Outpatient Medications Prior to Visit  Medication Sig Dispense Refill  . ALPRAZolam (XANAX) 1 MG tablet Take 0.5 mg by mouth at bedtime.  0  . amLODipine (NORVASC) 2.5 MG tablet TAKE ONE TABLET BY MOUTH DAILY **TAKE WITH '5MG'$  TABLET FOR DOSE OF 7.'5MG'$  DAILY** 90 tablet 0  . amLODipine (NORVASC) 5 MG tablet Take 1 tablet (5 mg total) by mouth daily. 90 tablet 0  . amoxicillin (AMOXIL) 500 MG tablet Take 4 tablets (2,'000mg'$ ) by mouth 30-60 minutes prior to dental cleaning. 4 tablet 0  . amoxicillin-clavulanate (AUGMENTIN) 875-125 MG tablet Take 1 tablet by mouth 2 (two) times daily. 20 tablet 0  . amphetamine-dextroamphetamine (ADDERALL XR) 30 MG 24 hr capsule TAKE 1 CAPSUEL BY MOUTH EACH MORNING 90 capsule 0  . ARIPiprazole (ABILIFY) 5 MG tablet Take 5 mg by mouth at bedtime.    Marland Kitchen aspirin 81 MG chewable tablet Chew 1 tablet (81 mg total) by mouth daily. 30 tablet 0  . Calcium Carbonate-Vitamin D (CALTRATE 600+D PO) Take 1 tablet by mouth daily.    . cyanocobalamin (,VITAMIN B-12,) 1000 MCG/ML injection INJECT 1ML INTO THE MUSCLE EVERY 21 DAYSAS DIRECTED 3 mL 3  . diclofenac Sodium (VOLTAREN) 1 % GEL APPLY TWO TO FOUR GRAMS TO AFFECTED JOINT UP TO FOUR TIMES A DAY (Patient taking differently: Apply 1 g topically in the morning, at noon, and at bedtime.) 400 g 2  . Eszopiclone 3 MG TABS TAKE 1  TABLET BY MOUTH AT BEDTIME TAKE IMMEDIATLEY BEFORE BEDTIME AS DIRECTED. 90 tablet 0  . ferrous sulfate 325 (65 FE) MG tablet Take 325 mg by mouth every Monday, Wednesday, and Friday.     . fluticasone (FLONASE) 50 MCG/ACT nasal spray PLACE 2 SPRAYS INTO BOTH NOSTRILS DAILY AS NEEDED (SEASONAL ALLERGIES) 16 g 0  . lamoTRIgine (LAMICTAL) 200 MG tablet Take 200 mg by mouth daily.    Marland Kitchen linaclotide (LINZESS) 290 MCG CAPS capsule Take 1 capsule (290 mcg total) by mouth daily. 90 capsule 1  . morphine (MSIR) 15 MG tablet Take 1 tablet (15 mg total) by mouth 3 (three) times daily as needed for severe pain or  moderate pain. 90 tablet 0  . MOVANTIK 25 MG TABS tablet TAKE ONE TABLET BY MOUTH EVERY DAY 90 tablet 1  . Multiple Vitamins-Minerals (PRESERVISION AREDS 2 PO) Take 1 capsule by mouth in the morning and at bedtime.    . pantoprazole (PROTONIX) 40 MG tablet TAKE ONE TABLET TWICE A DAY WITH MEALS 180 tablet 0  . pentosan polysulfate (ELMIRON) 100 MG capsule Take 200 mg by mouth 2 (two) times daily.     . polyethylene glycol (MIRALAX / GLYCOLAX) 17 g packet Take 17 g by mouth 2 (two) times daily.    . Prasterone (INTRAROSA) 6.5 MG INST Place 1 suppository vaginally at bedtime. 84 each 3  . pregabalin (LYRICA) 150 MG capsule TAKE 1 CAPSULE BY MOUTH TWICE DAILY 180 capsule 0  . PREVIDENT 5000 BOOSTER PLUS 1.1 % PSTE Place 1 application. onto teeth at bedtime.    . sodium chloride (OCEAN) 0.65 % SOLN nasal spray Place 1 spray into both nostrils as needed for congestion.    Marland Kitchen tiZANidine (ZANAFLEX) 2 MG tablet TAKE 1 TABLET BY MOUTH EVERY 8 HOURS AS NEEDED 30 tablet 0  . ursodiol (ACTIGALL) 300 MG capsule Take 1 capsule (300 mg total) by mouth 2 (two) times daily. 180 capsule 0  . ondansetron (ZOFRAN) 4 MG tablet TAKE ONE TABLET EVERY EIGHT HOURS AS NEEDED FOR NAUSEA / VOMITING 30 tablet 0  . ondansetron (ZOFRAN-ODT) 4 MG disintegrating tablet TAKE 1 TABLET BY MOUTH EVERY 8 HOURS AS NEEDED FOR NAUSEA OR VOMITING 20 tablet 1   No facility-administered medications prior to visit.     Per HPI unless specifically indicated in ROS section below Review of Systems  Objective:  BP 136/82   Pulse (!) 105   Temp 98.2 F (36.8 C) (Temporal)   Ht '5\' 3"'$  (1.6 m)   Wt 132 lb (59.9 kg)   LMP 07/25/1998   SpO2 95%   BMI 23.38 kg/m   Wt Readings from Last 3 Encounters:  12/21/21 132 lb (59.9 kg)  12/16/21 133 lb 6 oz (60.5 kg)  10/27/21 134 lb 9.6 oz (61.1 kg)      Physical Exam Vitals and nursing note reviewed.  Constitutional:      Appearance: Normal appearance.  Neck:     Comments: Limited ROM  with kyphotic cervical neck deformity Cardiovascular:     Rate and Rhythm: Normal rate and regular rhythm.     Pulses: Normal pulses.     Heart sounds: Normal heart sounds. No murmur heard. Pulmonary:     Effort: Pulmonary effort is normal. No respiratory distress.     Breath sounds: Normal breath sounds. No wheezing, rhonchi or rales.  Musculoskeletal:     Cervical back: Tenderness present.     Right lower leg: No edema.  Left lower leg: No edema.  Skin:    General: Skin is warm and dry.     Findings: No rash.  Neurological:     Mental Status: She is alert.  Psychiatric:        Mood and Affect: Mood normal.        Behavior: Behavior normal.      Results for orders placed or performed in visit on 12/16/21  Rapid Strep A  Result Value Ref Range   Rapid Strep A Screen Positive (A) Negative   *Note: Due to a large number of results and/or encounters for the requested time period, some results have not been displayed. A complete set of results can be found in Results Review.    Assessment & Plan:   Problem List Items Addressed This Visit     Encounter for chronic pain management - Primary (Chronic)    [place] CSRS reviewed. Stable period on morphine.  Discussed neurosurgery will likely prescribe post-surgical pain regimen.        Drug-induced constipation    Continues daily linzess and movantik with PRN MOM/senokot/miralax       Cervical disc disorder with radiculopathy of cervical region   DDD (degenerative disc disease), cervical   Strep throat    Continues improving with strep throat treatment (augmentin).        C5 cervical fracture (HCC)    Recent fall at home with resultant fracture of C5 vertebra and kyphosis, planned neck surgery repair (C5/6 corpectomy with ACDF of cervical spine from C3-7.  She has upcoming preop evaluation scheduled tomorrow. Anticipate adequately low risk to proceed with planned surgical intervention.          Meds ordered this  encounter  Medications  . ondansetron (ZOFRAN) 4 MG tablet    Sig: TAKE ONE TABLET EVERY EIGHT HOURS AS NEEDED FOR NAUSEA / VOMITING    Dispense:  30 tablet    Refill:  0   No orders of the defined types were placed in this encounter.    Patient Instructions  You are doing well today I hope you have a speedy recovery!  Keep me updated with how you're doing!  Return as needed or in 3 months for follow up visit .  Follow up plan: Return in about 3 months (around 03/23/2022) for follow up visit.  Ria Bush, MD

## 2021-12-21 NOTE — Assessment & Plan Note (Addendum)
Continues improving with strep throat treatment (augmentin).

## 2021-12-21 NOTE — Patient Instructions (Addendum)
You are doing well today I hope you have a speedy recovery!  Keep me updated with how you're doing!  Return as needed or in 3 months for follow up visit .

## 2021-12-21 NOTE — Assessment & Plan Note (Signed)
[place] CSRS reviewed. Stable period on morphine.  Discussed neurosurgery will likely prescribe post-surgical pain regimen.

## 2021-12-22 ENCOUNTER — Encounter (HOSPITAL_COMMUNITY)
Admission: RE | Admit: 2021-12-22 | Discharge: 2021-12-22 | Disposition: A | Discharge status: Home / Self Care | Payer: Medicare Other | Source: Ambulatory Visit | Attending: Neurosurgery | Admitting: Neurosurgery

## 2021-12-22 ENCOUNTER — Other Ambulatory Visit: Payer: Self-pay

## 2021-12-22 ENCOUNTER — Encounter (HOSPITAL_COMMUNITY): Payer: Self-pay

## 2021-12-22 VITALS — BP 150/85 | HR 91 | Temp 97.9°F | Resp 17 | Ht 63.0 in | Wt 132.5 lb

## 2021-12-22 DIAGNOSIS — Z01818 Encounter for other preprocedural examination: Secondary | ICD-10-CM

## 2021-12-22 DIAGNOSIS — Z01812 Encounter for preprocedural laboratory examination: Secondary | ICD-10-CM | POA: Diagnosis not present

## 2021-12-22 HISTORY — DX: Respiratory tuberculosis unspecified: A15.9

## 2021-12-22 LAB — SURGICAL PCR SCREEN
MRSA, PCR: NEGATIVE
Staphylococcus aureus: NEGATIVE

## 2021-12-22 LAB — BASIC METABOLIC PANEL
Anion gap: 7 (ref 5–15)
BUN: <5 mg/dL — ABNORMAL LOW (ref 8–23)
CO2: 29 mmol/L (ref 22–32)
Calcium: 9.3 mg/dL (ref 8.9–10.3)
Chloride: 100 mmol/L (ref 98–111)
Creatinine, Ser: 0.55 mg/dL (ref 0.44–1.00)
GFR, Estimated: >60 mL/min (ref >60)
Glucose, Bld: 105 mg/dL — ABNORMAL HIGH (ref 70–99)
Potassium: 4.3 mmol/L (ref 3.5–5.1)
Sodium: 136 mmol/L (ref 135–145)

## 2021-12-22 LAB — CBC
HCT: 38.1 % (ref 36.0–46.0)
Hemoglobin: 12.5 g/dL (ref 12.0–15.0)
MCH: 28.3 pg (ref 26.0–34.0)
MCHC: 32.8 g/dL (ref 30.0–36.0)
MCV: 86.4 fL (ref 80.0–100.0)
Platelets: 301 10*3/uL (ref 150–400)
RBC: 4.41 MIL/uL (ref 3.87–5.11)
RDW: 13.2 % (ref 11.5–15.5)
WBC: 6.8 10*3/uL (ref 4.0–10.5)
nRBC: 0 % (ref 0.0–0.2)

## 2021-12-22 NOTE — Progress Notes (Signed)
Surgical Instructions    Your procedure is scheduled on Wednesday June 7th.  Report to [facility] Main Entrance "A" at 0730 A.M., then check in with the Admitting office.  Call this number if you have problems the morning of surgery:  [phone]   If you have any questions prior to your surgery date call [phone]: Open Monday-Friday 8am-4pm    Remember:  Do not eat or drink anything after midnight the night before your surgery     Take these medicines the morning of surgery with A SIP OF WATER: amLODipine (NORVASC)  amoxicillin-clavulanate (AUGMENTIN)  fluticasone (FLONASE)  lamoTRIgine (LAMICTAL)  linaclotide (LINZESS)  pantoprazole (PROTONIX)  pregabalin (LYRICA)  ursodiol (ACTIGALL)   IF NEEDED  morphine (MSIR)  ondansetron (ZOFRAN)  sodium chloride (OCEAN)  nasal spray tiZANidine (ZANAFLEX)   As of today, STOP taking any Aspirin (unless otherwise instructed by your surgeon) Voltaren, Aleve, Naproxen, Ibuprofen, Motrin, Advil, Goody's, BC's, all herbal medications, fish oil, and all vitamins.           Do not wear jewelry or makeup Do not wear lotions, powders, perfumes, or deodorant. Do not shave 48 hours prior to surgery.   Do not bring valuables to the hospital. Do not wear nail polish, gel polish, artificial nails, or any other type of covering on natural nails (fingers and toes) If you have artificial nails or gel coating that need to be removed by a nail salon, please have this removed prior to surgery. Artificial nails or gel coating may interfere with anesthesia's ability to adequately monitor your vital signs.  [facility] is not responsible for any belongings or valuables. .   Do NOT Smoke (Tobacco/Vaping)  24 hours prior to your procedure  If you use a CPAP at night, you may bring your mask for your overnight stay.   Contacts, glasses, hearing aids, dentures or partials may not be worn into surgery, please bring cases for these belongings   For  patients admitted to the hospital, discharge time will be determined by your treatment team.   Patients discharged the day of surgery will not be allowed to drive home, and someone needs to stay with them for 24 hours.   SURGICAL WAITING ROOM VISITATION Patients having surgery or a procedure in a hospital may have two support people. Children under the age of 38 must have an adult with them who is not the patient. They may stay in the waiting area during the procedure and may switch out with other visitors. If the patient needs to stay at the hospital during part of their recovery, the visitor guidelines for inpatient rooms apply.  Please refer to the [facility] website for the visitor guidelines for Inpatients (after your surgery is over and you are in a regular room).       Special instructions:    Oral Hygiene is also important to reduce your risk of infection.  Remember - BRUSH YOUR TEETH THE MORNING OF SURGERY WITH YOUR REGULAR TOOTHPASTE   - Preparing For Surgery  Before surgery, you can play an important role. Because skin is not sterile, your skin needs to be as free of germs as possible. You can reduce the number of germs on your skin by washing with CHG (chlorahexidine gluconate) Soap before surgery.  CHG is an antiseptic cleaner which kills germs and bonds with the skin to continue killing germs even after washing.     Please do not use if you have an allergy to CHG  or antibacterial soaps. If your skin becomes reddened/irritated stop using the CHG.  Do not shave (including legs and underarms) for at least 48 hours prior to first CHG shower. It is OK to shave your face.  Please follow these instructions carefully.     Shower the NIGHT BEFORE SURGERY and the MORNING OF SURGERY with CHG Soap.   If you chose to wash your hair, wash your hair first as usual with your normal shampoo. After you shampoo, rinse your hair and body thoroughly to remove the shampoo.  Then  [employer] and genitals (private parts) with your normal soap and rinse thoroughly to remove soap.  After that Use CHG Soap as you would any other liquid soap. You can apply CHG directly to the skin and wash gently with a scrungie or a clean washcloth.   Apply the CHG Soap to your body ONLY FROM THE NECK DOWN.  Do not use on open wounds or open sores. Avoid contact with your eyes, ears, mouth and genitals (private parts). Wash Face and genitals (private parts)  with your normal soap.   Wash thoroughly, paying special attention to the area where your surgery will be performed.  Thoroughly rinse your body with warm water from the neck down.  DO NOT shower/wash with your normal soap after using and rinsing off the CHG Soap.  Pat yourself dry with a CLEAN TOWEL.  Wear CLEAN PAJAMAS to bed the night before surgery  Place CLEAN SHEETS on your bed the night before your surgery  DO NOT SLEEP WITH PETS.   Day of Surgery:  Take a shower with CHG soap. Wear Clean/Comfortable clothing the morning of surgery Do not apply any deodorants/lotions.   Remember to brush your teeth WITH YOUR REGULAR TOOTHPASTE.    If you received a COVID test during your pre-op visit, it is requested that you wear a mask when out in public, stay away from anyone that may not be feeling well, and notify your surgeon if you develop symptoms. If you have been in contact with anyone that has tested positive in the last 10 days, please notify your surgeon.    Please read over the following fact sheets that you were given.

## 2021-12-23 LAB — TYPE AND SCREEN
ABO/RH(D): A NEG
Antibody Screen: NEGATIVE

## 2021-12-23 NOTE — Progress Notes (Signed)
Pt needs redraw of type and screen day of surgery d/t recently receiving platelets.

## 2021-12-24 ENCOUNTER — Other Ambulatory Visit: Payer: Self-pay | Admitting: Family Medicine

## 2021-12-24 NOTE — Telephone Encounter (Signed)
Refill request Morphine Last office visit 12/21/21 Last refill 11/09/21 #[phone]

## 2021-12-27 NOTE — Telephone Encounter (Signed)
ERx 

## 2021-12-28 ENCOUNTER — Ambulatory Visit (INDEPENDENT_AMBULATORY_CARE_PROVIDER_SITE_OTHER): Payer: Medicare Other | Admitting: Rheumatology

## 2021-12-28 ENCOUNTER — Encounter: Payer: Self-pay | Admitting: Rheumatology

## 2021-12-28 VITALS — BP 125/74 | HR 87 | Resp 16 | Ht 63.0 in | Wt 133.0 lb

## 2021-12-28 DIAGNOSIS — M349 Systemic sclerosis, unspecified: Secondary | ICD-10-CM | POA: Diagnosis not present

## 2021-12-28 DIAGNOSIS — M5030 Other cervical disc degeneration, unspecified cervical region: Secondary | ICD-10-CM

## 2021-12-28 DIAGNOSIS — R7303 Prediabetes: Secondary | ICD-10-CM

## 2021-12-28 DIAGNOSIS — M85851 Other specified disorders of bone density and structure, right thigh: Secondary | ICD-10-CM

## 2021-12-28 DIAGNOSIS — M4185 Other forms of scoliosis, thoracolumbar region: Secondary | ICD-10-CM

## 2021-12-28 DIAGNOSIS — Z8659 Personal history of other mental and behavioral disorders: Secondary | ICD-10-CM

## 2021-12-28 DIAGNOSIS — Z96651 Presence of right artificial knee joint: Secondary | ICD-10-CM

## 2021-12-28 DIAGNOSIS — G9332 Myalgic encephalomyelitis/chronic fatigue syndrome: Secondary | ICD-10-CM

## 2021-12-28 DIAGNOSIS — Z8269 Family history of other diseases of the musculoskeletal system and connective tissue: Secondary | ICD-10-CM

## 2021-12-28 DIAGNOSIS — R5383 Other fatigue: Secondary | ICD-10-CM

## 2021-12-28 DIAGNOSIS — M19042 Primary osteoarthritis, left hand: Secondary | ICD-10-CM

## 2021-12-28 DIAGNOSIS — I7300 Raynaud's syndrome without gangrene: Secondary | ICD-10-CM

## 2021-12-28 DIAGNOSIS — M19041 Primary osteoarthritis, right hand: Secondary | ICD-10-CM | POA: Diagnosis not present

## 2021-12-28 DIAGNOSIS — M5136 Other intervertebral disc degeneration, lumbar region: Secondary | ICD-10-CM

## 2021-12-28 DIAGNOSIS — Z96642 Presence of left artificial hip joint: Secondary | ICD-10-CM

## 2021-12-28 DIAGNOSIS — Z8639 Personal history of other endocrine, nutritional and metabolic disease: Secondary | ICD-10-CM

## 2021-12-28 DIAGNOSIS — Z8719 Personal history of other diseases of the digestive system: Secondary | ICD-10-CM

## 2021-12-28 DIAGNOSIS — M797 Fibromyalgia: Secondary | ICD-10-CM

## 2021-12-28 DIAGNOSIS — G894 Chronic pain syndrome: Secondary | ICD-10-CM

## 2021-12-28 DIAGNOSIS — M1712 Unilateral primary osteoarthritis, left knee: Secondary | ICD-10-CM

## 2021-12-29 ENCOUNTER — Other Ambulatory Visit: Payer: Self-pay

## 2021-12-29 ENCOUNTER — Inpatient Hospital Stay (HOSPITAL_COMMUNITY): Payer: Medicare Other | Admitting: Anesthesiology

## 2021-12-29 ENCOUNTER — Encounter (HOSPITAL_COMMUNITY): Payer: Self-pay | Admitting: Neurosurgery

## 2021-12-29 ENCOUNTER — Inpatient Hospital Stay (HOSPITAL_COMMUNITY): Payer: Medicare Other

## 2021-12-29 ENCOUNTER — Inpatient Hospital Stay (HOSPITAL_COMMUNITY)
Admission: RE | Disposition: A | Discharge status: Rehabilitation Facility | Payer: Self-pay | Source: Home / Self Care | Attending: Neurosurgery

## 2021-12-29 ENCOUNTER — Inpatient Hospital Stay (HOSPITAL_COMMUNITY)
Admission: RE | Admit: 2021-12-29 | Discharge: 2022-01-07 | DRG: 454 | Disposition: A | Discharge status: Rehabilitation Facility | Payer: Medicare Other | Attending: Neurosurgery | Admitting: Neurosurgery

## 2021-12-29 DIAGNOSIS — F3130 Bipolar disorder, current episode depressed, mild or moderate severity, unspecified: Secondary | ICD-10-CM | POA: Diagnosis present

## 2021-12-29 DIAGNOSIS — M5010 Cervical disc disorder with radiculopathy, unspecified cervical region: Secondary | ICD-10-CM | POA: Diagnosis not present

## 2021-12-29 DIAGNOSIS — N3010 Interstitial cystitis (chronic) without hematuria: Secondary | ICD-10-CM | POA: Diagnosis present

## 2021-12-29 DIAGNOSIS — S12500A Unspecified displaced fracture of sixth cervical vertebra, initial encounter for closed fracture: Secondary | ICD-10-CM

## 2021-12-29 DIAGNOSIS — M792 Neuralgia and neuritis, unspecified: Secondary | ICD-10-CM | POA: Diagnosis present

## 2021-12-29 DIAGNOSIS — K5900 Constipation, unspecified: Secondary | ICD-10-CM | POA: Diagnosis not present

## 2021-12-29 DIAGNOSIS — M1990 Unspecified osteoarthritis, unspecified site: Secondary | ICD-10-CM | POA: Diagnosis not present

## 2021-12-29 DIAGNOSIS — Z96642 Presence of left artificial hip joint: Secondary | ICD-10-CM | POA: Diagnosis present

## 2021-12-29 DIAGNOSIS — Z9882 Breast implant status: Secondary | ICD-10-CM

## 2021-12-29 DIAGNOSIS — M797 Fibromyalgia: Secondary | ICD-10-CM | POA: Diagnosis present

## 2021-12-29 DIAGNOSIS — W19XXXA Unspecified fall, initial encounter: Secondary | ICD-10-CM | POA: Diagnosis present

## 2021-12-29 DIAGNOSIS — D62 Acute posthemorrhagic anemia: Secondary | ICD-10-CM | POA: Diagnosis not present

## 2021-12-29 DIAGNOSIS — Z888 Allergy status to other drugs, medicaments and biological substances status: Secondary | ICD-10-CM

## 2021-12-29 DIAGNOSIS — Z4789 Encounter for other orthopedic aftercare: Secondary | ICD-10-CM | POA: Diagnosis not present

## 2021-12-29 DIAGNOSIS — M40202 Unspecified kyphosis, cervical region: Secondary | ICD-10-CM

## 2021-12-29 DIAGNOSIS — I10 Essential (primary) hypertension: Secondary | ICD-10-CM | POA: Diagnosis present

## 2021-12-29 DIAGNOSIS — M349 Systemic sclerosis, unspecified: Secondary | ICD-10-CM | POA: Diagnosis present

## 2021-12-29 DIAGNOSIS — S12501S Unspecified nondisplaced fracture of sixth cervical vertebra, sequela: Secondary | ICD-10-CM | POA: Diagnosis not present

## 2021-12-29 DIAGNOSIS — D72829 Elevated white blood cell count, unspecified: Secondary | ICD-10-CM | POA: Diagnosis not present

## 2021-12-29 DIAGNOSIS — G8929 Other chronic pain: Secondary | ICD-10-CM | POA: Diagnosis present

## 2021-12-29 DIAGNOSIS — E871 Hypo-osmolality and hyponatremia: Secondary | ICD-10-CM | POA: Diagnosis not present

## 2021-12-29 DIAGNOSIS — M5412 Radiculopathy, cervical region: Secondary | ICD-10-CM | POA: Diagnosis present

## 2021-12-29 DIAGNOSIS — Z885 Allergy status to narcotic agent status: Secondary | ICD-10-CM

## 2021-12-29 DIAGNOSIS — S12400A Unspecified displaced fracture of fifth cervical vertebra, initial encounter for closed fracture: Secondary | ICD-10-CM | POA: Diagnosis present

## 2021-12-29 DIAGNOSIS — Z79899 Other long term (current) drug therapy: Secondary | ICD-10-CM

## 2021-12-29 DIAGNOSIS — I7300 Raynaud's syndrome without gangrene: Secondary | ICD-10-CM | POA: Diagnosis present

## 2021-12-29 DIAGNOSIS — T84226A Displacement of internal fixation device of vertebrae, initial encounter: Secondary | ICD-10-CM | POA: Diagnosis not present

## 2021-12-29 DIAGNOSIS — K8020 Calculus of gallbladder without cholecystitis without obstruction: Secondary | ICD-10-CM | POA: Diagnosis not present

## 2021-12-29 DIAGNOSIS — Z96651 Presence of right artificial knee joint: Secondary | ICD-10-CM | POA: Diagnosis present

## 2021-12-29 DIAGNOSIS — F419 Anxiety disorder, unspecified: Secondary | ICD-10-CM | POA: Diagnosis present

## 2021-12-29 DIAGNOSIS — M4322 Fusion of spine, cervical region: Secondary | ICD-10-CM | POA: Diagnosis not present

## 2021-12-29 DIAGNOSIS — H3553 Other dystrophies primarily involving the sensory retina: Secondary | ICD-10-CM | POA: Diagnosis present

## 2021-12-29 DIAGNOSIS — G4700 Insomnia, unspecified: Secondary | ICD-10-CM | POA: Diagnosis not present

## 2021-12-29 DIAGNOSIS — D649 Anemia, unspecified: Secondary | ICD-10-CM | POA: Diagnosis not present

## 2021-12-29 DIAGNOSIS — Z8261 Family history of arthritis: Secondary | ICD-10-CM

## 2021-12-29 DIAGNOSIS — K5904 Chronic idiopathic constipation: Secondary | ICD-10-CM | POA: Diagnosis not present

## 2021-12-29 DIAGNOSIS — K8010 Calculus of gallbladder with chronic cholecystitis without obstruction: Secondary | ICD-10-CM | POA: Diagnosis present

## 2021-12-29 DIAGNOSIS — Z981 Arthrodesis status: Secondary | ICD-10-CM | POA: Diagnosis not present

## 2021-12-29 DIAGNOSIS — F418 Other specified anxiety disorders: Secondary | ICD-10-CM | POA: Diagnosis not present

## 2021-12-29 DIAGNOSIS — Z8249 Family history of ischemic heart disease and other diseases of the circulatory system: Secondary | ICD-10-CM

## 2021-12-29 DIAGNOSIS — K219 Gastro-esophageal reflux disease without esophagitis: Secondary | ICD-10-CM | POA: Diagnosis present

## 2021-12-29 DIAGNOSIS — G479 Sleep disorder, unspecified: Secondary | ICD-10-CM | POA: Diagnosis present

## 2021-12-29 DIAGNOSIS — M7989 Other specified soft tissue disorders: Secondary | ICD-10-CM | POA: Diagnosis not present

## 2021-12-29 DIAGNOSIS — K581 Irritable bowel syndrome with constipation: Secondary | ICD-10-CM | POA: Diagnosis present

## 2021-12-29 DIAGNOSIS — Z881 Allergy status to other antibiotic agents status: Secondary | ICD-10-CM

## 2021-12-29 HISTORY — PX: POSTERIOR CERVICAL FUSION/FORAMINOTOMY: SHX5038

## 2021-12-29 HISTORY — PX: ANTERIOR CERVICAL DECOMPRESSION/DISCECTOMY FUSION 4 LEVELS: SHX5556

## 2021-12-29 HISTORY — PX: ANTERIOR CERVICAL DECOMP/DISCECTOMY FUSION: SHX1161

## 2021-12-29 LAB — POCT I-STAT 7, (LYTES, BLD GAS, ICA,H+H)
Acid-Base Excess: 0 mmol/L (ref 0.0–2.0)
Bicarbonate: 24.3 mmol/L (ref 20.0–28.0)
Calcium, Ion: 1.21 mmol/L (ref 1.15–1.40)
HCT: 30 % — ABNORMAL LOW (ref 36.0–46.0)
Hemoglobin: 10.2 g/dL — ABNORMAL LOW (ref 12.0–15.0)
O2 Saturation: 100 %
Patient temperature: 37.2
Potassium: 3.4 mmol/L — ABNORMAL LOW (ref 3.5–5.1)
Sodium: 137 mmol/L (ref 135–145)
TCO2: 25 mmol/L (ref 22–32)
pCO2 arterial: 36.9 mmHg (ref 32–48)
pH, Arterial: 7.428 (ref 7.35–7.45)
pO2, Arterial: 253 mmHg — ABNORMAL HIGH (ref 83–108)

## 2021-12-29 LAB — PREPARE RBC (CROSSMATCH)

## 2021-12-29 SURGERY — ANTERIOR CERVICAL DECOMPRESSION/DISCECTOMY FUSION 4 LEVELS
Anesthesia: General

## 2021-12-29 MED ORDER — SODIUM CHLORIDE 0.9 % IV SOLN
0.0500 ug/kg/min | INTRAVENOUS | Status: AC
  Administered 2021-12-29: .2 ug/kg/min via INTRAVENOUS
  Filled 2021-12-29: qty 5000

## 2021-12-29 MED ORDER — MENTHOL 3 MG MT LOZG
1.0000 | LOZENGE | OROMUCOSAL | Status: DC | PRN
  Administered 2022-01-02: 3 mg via ORAL
  Filled 2021-12-29: qty 9

## 2021-12-29 MED ORDER — LACTATED RINGERS IV SOLN: INTRAVENOUS | Status: DC

## 2021-12-29 MED ORDER — 0.9 % SODIUM CHLORIDE (POUR BTL) OPTIME
TOPICAL | Status: DC | PRN
  Administered 2021-12-29: 1000 mL

## 2021-12-29 MED ORDER — PANTOPRAZOLE SODIUM 40 MG PO TBEC
40.0000 mg | DELAYED_RELEASE_TABLET | Freq: Two times a day (BID) | ORAL | Status: DC
  Administered 2021-12-29 – 2022-01-07 (×17): 40 mg via ORAL
  Filled 2021-12-29 (×17): qty 1

## 2021-12-29 MED ORDER — BUPIVACAINE-EPINEPHRINE 0.5% -1:200000 IJ SOLN
INTRAMUSCULAR | Status: AC
  Filled 2021-12-29: qty 1

## 2021-12-29 MED ORDER — SUCCINYLCHOLINE CHLORIDE 200 MG/10ML IV SOSY
PREFILLED_SYRINGE | INTRAVENOUS | Status: DC | PRN
  Administered 2021-12-29: 120 mg via INTRAVENOUS

## 2021-12-29 MED ORDER — OXYCODONE HCL 5 MG PO TABS
5.0000 mg | ORAL_TABLET | ORAL | Status: DC | PRN
  Administered 2021-12-30 – 2021-12-31 (×2): 5 mg via ORAL
  Filled 2021-12-29 (×5): qty 1

## 2021-12-29 MED ORDER — PROPOFOL 10 MG/ML IV BOLUS
INTRAVENOUS | Status: AC
  Filled 2021-12-29: qty 20

## 2021-12-29 MED ORDER — ONDANSETRON HCL 4 MG/2ML IJ SOLN: 4.0000 mg | Freq: Once | INTRAMUSCULAR | Status: DC | PRN

## 2021-12-29 MED ORDER — HYDROMORPHONE HCL 1 MG/ML IJ SOLN
INTRAMUSCULAR | Status: AC
  Filled 2021-12-29: qty 1

## 2021-12-29 MED ORDER — DEXAMETHASONE SODIUM PHOSPHATE 10 MG/ML IJ SOLN
INTRAMUSCULAR | Status: DC | PRN
  Administered 2021-12-29: 10 mg via INTRAVENOUS

## 2021-12-29 MED ORDER — BACITRACIN ZINC 500 UNIT/GM EX OINT
TOPICAL_OINTMENT | CUTANEOUS | Status: DC | PRN
  Administered 2021-12-29 (×2): 1 via TOPICAL

## 2021-12-29 MED ORDER — PANTOPRAZOLE SODIUM 40 MG IV SOLR: 40.0000 mg | Freq: Every day | INTRAVENOUS | Status: DC

## 2021-12-29 MED ORDER — BUPIVACAINE LIPOSOME 1.3 % IJ SUSP
INTRAMUSCULAR | Status: AC
  Filled 2021-12-29: qty 20

## 2021-12-29 MED ORDER — ALBUTEROL SULFATE HFA 108 (90 BASE) MCG/ACT IN AERS
INHALATION_SPRAY | RESPIRATORY_TRACT | Status: AC
  Filled 2021-12-29: qty 6.7

## 2021-12-29 MED ORDER — VANCOMYCIN HCL IN DEXTROSE 1-5 GM/200ML-% IV SOLN
1000.0000 mg | INTRAVENOUS | Status: AC
  Administered 2021-12-29: 1000 mg via INTRAVENOUS
  Filled 2021-12-29: qty 200

## 2021-12-29 MED ORDER — OXYCODONE HCL 5 MG/5ML PO SOLN: 5.0000 mg | Freq: Once | ORAL | Status: DC | PRN

## 2021-12-29 MED ORDER — FLUTICASONE PROPIONATE 50 MCG/ACT NA SUSP: 1.0000 | Freq: Every day | NASAL | Status: DC

## 2021-12-29 MED ORDER — DEXAMETHASONE SODIUM PHOSPHATE 4 MG/ML IJ SOLN
4.0000 mg | Freq: Four times a day (QID) | INTRAMUSCULAR | Status: AC
  Administered 2021-12-29 – 2021-12-30 (×2): 4 mg via INTRAVENOUS
  Filled 2021-12-29 (×2): qty 1

## 2021-12-29 MED ORDER — SODIUM CHLORIDE 0.9% IV SOLUTION: Freq: Once | INTRAVENOUS | Status: AC

## 2021-12-29 MED ORDER — KETOROLAC TROMETHAMINE 30 MG/ML IJ SOLN: 30.0000 mg | Freq: Once | INTRAMUSCULAR | Status: DC | PRN

## 2021-12-29 MED ORDER — ALBUMIN HUMAN 5 % IV SOLN: INTRAVENOUS | Status: DC | PRN

## 2021-12-29 MED ORDER — POLYETHYLENE GLYCOL 3350 17 G PO PACK
17.0000 g | PACK | Freq: Two times a day (BID) | ORAL | Status: DC
  Administered 2021-12-31 – 2022-01-07 (×6): 17 g via ORAL
  Filled 2021-12-29 (×13): qty 1

## 2021-12-29 MED ORDER — ONDANSETRON HCL 4 MG/2ML IJ SOLN
INTRAMUSCULAR | Status: DC | PRN
  Administered 2021-12-29: 4 mg via INTRAVENOUS

## 2021-12-29 MED ORDER — LIDOCAINE 2% (20 MG/ML) 5 ML SYRINGE
INTRAMUSCULAR | Status: DC | PRN
  Administered 2021-12-29: 40 mg via INTRAVENOUS

## 2021-12-29 MED ORDER — AMPHETAMINE-DEXTROAMPHET ER 10 MG PO CP24: 10.0000 mg | ORAL_CAPSULE | Freq: Every day | ORAL | Status: DC

## 2021-12-29 MED ORDER — FERROUS SULFATE 325 (65 FE) MG PO TABS
325.0000 mg | ORAL_TABLET | ORAL | Status: DC
  Administered 2021-12-31 – 2022-01-07 (×4): 325 mg via ORAL
  Filled 2021-12-29 (×6): qty 1

## 2021-12-29 MED ORDER — CHLORHEXIDINE GLUCONATE CLOTH 2 % EX PADS: 6.0000 | MEDICATED_PAD | Freq: Once | CUTANEOUS | Status: DC

## 2021-12-29 MED ORDER — CEFAZOLIN SODIUM-DEXTROSE 2-4 GM/100ML-% IV SOLN
2.0000 g | Freq: Three times a day (TID) | INTRAVENOUS | Status: AC
  Administered 2021-12-29 – 2021-12-30 (×2): 2 g via INTRAVENOUS
  Filled 2021-12-29 (×2): qty 100

## 2021-12-29 MED ORDER — URSODIOL 300 MG PO CAPS
300.0000 mg | ORAL_CAPSULE | Freq: Two times a day (BID) | ORAL | Status: DC
  Administered 2022-01-02 – 2022-01-07 (×11): 300 mg via ORAL
  Filled 2021-12-29 (×19): qty 1

## 2021-12-29 MED ORDER — ZOLPIDEM TARTRATE 5 MG PO TABS
5.0000 mg | ORAL_TABLET | Freq: Every evening | ORAL | Status: DC | PRN
  Filled 2021-12-29: qty 1

## 2021-12-29 MED ORDER — BISACODYL 10 MG RE SUPP: 10.0000 mg | Freq: Every day | RECTAL | Status: DC | PRN

## 2021-12-29 MED ORDER — CHLORHEXIDINE GLUCONATE 0.12 % MT SOLN
15.0000 mL | Freq: Once | OROMUCOSAL | Status: AC
  Administered 2021-12-29: 15 mL via OROMUCOSAL
  Filled 2021-12-29: qty 15

## 2021-12-29 MED ORDER — BUPIVACAINE-EPINEPHRINE (PF) 0.5% -1:200000 IJ SOLN
INTRAMUSCULAR | Status: DC | PRN
  Administered 2021-12-29 (×2): 10 mL

## 2021-12-29 MED ORDER — BACITRACIN ZINC 500 UNIT/GM EX OINT
TOPICAL_OINTMENT | CUTANEOUS | Status: AC
Start: 2021-12-29 — End: ?
  Filled 2021-12-29: qty 28.35

## 2021-12-29 MED ORDER — THROMBIN 5000 UNITS EX SOLR
CUTANEOUS | Status: AC
  Filled 2021-12-29: qty 5000

## 2021-12-29 MED ORDER — PENTOSAN POLYSULFATE SODIUM 100 MG PO CAPS
200.0000 mg | ORAL_CAPSULE | Freq: Two times a day (BID) | ORAL | Status: DC
  Administered 2021-12-30 – 2022-01-07 (×16): 200 mg via ORAL
  Filled 2021-12-29 (×18): qty 2

## 2021-12-29 MED ORDER — OXYCODONE HCL 5 MG PO TABS
10.0000 mg | ORAL_TABLET | ORAL | Status: DC | PRN
  Administered 2021-12-29 – 2022-01-01 (×11): 10 mg via ORAL
  Filled 2021-12-29 (×11): qty 2

## 2021-12-29 MED ORDER — MORPHINE SULFATE (PF) 4 MG/ML IV SOLN
4.0000 mg | INTRAVENOUS | Status: DC | PRN
  Administered 2021-12-30 – 2021-12-31 (×3): 4 mg via INTRAVENOUS
  Filled 2021-12-29 (×3): qty 1

## 2021-12-29 MED ORDER — DEXAMETHASONE 4 MG PO TABS: 4.0000 mg | ORAL_TABLET | Freq: Four times a day (QID) | ORAL | Status: AC

## 2021-12-29 MED ORDER — BUPIVACAINE LIPOSOME 1.3 % IJ SUSP
INTRAMUSCULAR | Status: DC | PRN
  Administered 2021-12-29: 20 mL

## 2021-12-29 MED ORDER — KETAMINE HCL 50 MG/5ML IJ SOSY
PREFILLED_SYRINGE | INTRAMUSCULAR | Status: AC
  Filled 2021-12-29: qty 5

## 2021-12-29 MED ORDER — MIDAZOLAM HCL 5 MG/5ML IJ SOLN
INTRAMUSCULAR | Status: DC | PRN
  Administered 2021-12-29: 2 mg via INTRAVENOUS

## 2021-12-29 MED ORDER — LINACLOTIDE 145 MCG PO CAPS
290.0000 ug | ORAL_CAPSULE | Freq: Every day | ORAL | Status: DC
  Administered 2022-01-03 – 2022-01-07 (×3): 290 ug via ORAL
  Filled 2021-12-29 (×9): qty 2

## 2021-12-29 MED ORDER — SUCCINYLCHOLINE CHLORIDE 200 MG/10ML IV SOSY
PREFILLED_SYRINGE | INTRAVENOUS | Status: AC
  Filled 2021-12-29: qty 10

## 2021-12-29 MED ORDER — DEXAMETHASONE SODIUM PHOSPHATE 10 MG/ML IJ SOLN
INTRAMUSCULAR | Status: AC
  Filled 2021-12-29: qty 1

## 2021-12-29 MED ORDER — DOCUSATE SODIUM 100 MG PO CAPS
100.0000 mg | ORAL_CAPSULE | Freq: Two times a day (BID) | ORAL | Status: DC
  Administered 2021-12-29 – 2022-01-07 (×12): 100 mg via ORAL
  Filled 2021-12-29 (×16): qty 1

## 2021-12-29 MED ORDER — MORPHINE SULFATE 15 MG PO TABS: 15.0000 mg | ORAL_TABLET | ORAL | Status: DC | PRN

## 2021-12-29 MED ORDER — LACTATED RINGERS IV SOLN: INTRAVENOUS | Status: DC | PRN

## 2021-12-29 MED ORDER — PROPOFOL 500 MG/50ML IV EMUL
INTRAVENOUS | Status: DC | PRN
  Administered 2021-12-29: 100 ug/kg/min via INTRAVENOUS
  Administered 2021-12-29: 150 ug/kg/min via INTRAVENOUS

## 2021-12-29 MED ORDER — LIDOCAINE 2% (20 MG/ML) 5 ML SYRINGE
INTRAMUSCULAR | Status: AC
  Filled 2021-12-29: qty 10

## 2021-12-29 MED ORDER — ACETAMINOPHEN 325 MG PO TABS
650.0000 mg | ORAL_TABLET | ORAL | Status: DC | PRN
  Filled 2021-12-29 (×2): qty 2

## 2021-12-29 MED ORDER — AMLODIPINE BESYLATE 5 MG PO TABS
5.0000 mg | ORAL_TABLET | Freq: Every day | ORAL | Status: DC
  Administered 2021-12-31: 5 mg via ORAL
  Filled 2021-12-29 (×2): qty 1

## 2021-12-29 MED ORDER — PHENOL 1.4 % MT LIQD: 1.0000 | OROMUCOSAL | Status: DC | PRN

## 2021-12-29 MED ORDER — ONDANSETRON HCL 4 MG/2ML IJ SOLN
INTRAMUSCULAR | Status: AC
  Filled 2021-12-29: qty 2

## 2021-12-29 MED ORDER — PHENYLEPHRINE 80 MCG/ML (10ML) SYRINGE FOR IV PUSH (FOR BLOOD PRESSURE SUPPORT)
PREFILLED_SYRINGE | INTRAVENOUS | Status: AC
  Filled 2021-12-29: qty 10

## 2021-12-29 MED ORDER — PRASTERONE 6.5 MG VA INST: 1.0000 | VAGINAL_INSERT | Freq: Every day | VAGINAL | Status: DC

## 2021-12-29 MED ORDER — THROMBIN 5000 UNITS EX SOLR
CUTANEOUS | Status: AC
  Filled 2021-12-29: qty 10000

## 2021-12-29 MED ORDER — PREGABALIN 75 MG PO CAPS
150.0000 mg | ORAL_CAPSULE | Freq: Two times a day (BID) | ORAL | Status: DC
  Administered 2021-12-29 – 2022-01-07 (×17): 150 mg via ORAL
  Filled 2021-12-29 (×18): qty 2

## 2021-12-29 MED ORDER — ORAL CARE MOUTH RINSE: 15.0000 mL | Freq: Once | OROMUCOSAL | Status: AC

## 2021-12-29 MED ORDER — ACETAMINOPHEN 650 MG RE SUPP: 650.0000 mg | RECTAL | Status: DC | PRN

## 2021-12-29 MED ORDER — TIZANIDINE HCL 4 MG PO TABS: 2.0000 mg | ORAL_TABLET | Freq: Four times a day (QID) | ORAL | Status: DC | PRN

## 2021-12-29 MED ORDER — HYDROMORPHONE HCL 1 MG/ML IJ SOLN
0.2500 mg | INTRAMUSCULAR | Status: DC | PRN
  Administered 2021-12-29: 0.5 mg via INTRAVENOUS

## 2021-12-29 MED ORDER — ONDANSETRON HCL 4 MG PO TABS
4.0000 mg | ORAL_TABLET | Freq: Four times a day (QID) | ORAL | Status: DC | PRN
Start: 2021-12-29 — End: 2022-01-07

## 2021-12-29 MED ORDER — ALUM & MAG HYDROXIDE-SIMETH 200-200-20 MG/5ML PO SUSP: 30.0000 mL | Freq: Four times a day (QID) | ORAL | Status: DC | PRN

## 2021-12-29 MED ORDER — LAMOTRIGINE 100 MG PO TABS
200.0000 mg | ORAL_TABLET | Freq: Every day | ORAL | Status: DC
  Administered 2021-12-31 – 2022-01-07 (×8): 200 mg via ORAL
  Filled 2021-12-29 (×9): qty 2

## 2021-12-29 MED ORDER — ROCURONIUM BROMIDE 10 MG/ML (PF) SYRINGE
PREFILLED_SYRINGE | INTRAVENOUS | Status: AC
Start: 2021-12-29 — End: ?
  Filled 2021-12-29: qty 10

## 2021-12-29 MED ORDER — PHENYLEPHRINE HCL-NACL 20-0.9 MG/250ML-% IV SOLN
INTRAVENOUS | Status: DC | PRN
  Administered 2021-12-29: 25 ug/min via INTRAVENOUS

## 2021-12-29 MED ORDER — NALOXEGOL OXALATE 12.5 MG PO TABS
12.5000 mg | ORAL_TABLET | Freq: Every day | ORAL | Status: DC
  Administered 2022-01-03 – 2022-01-07 (×3): 12.5 mg via ORAL
  Filled 2021-12-29 (×9): qty 1

## 2021-12-29 MED ORDER — MIDAZOLAM HCL 2 MG/2ML IJ SOLN
INTRAMUSCULAR | Status: AC
  Filled 2021-12-29: qty 2

## 2021-12-29 MED ORDER — THROMBIN 5000 UNITS EX SOLR
OROMUCOSAL | Status: DC | PRN
  Administered 2021-12-29 (×5): 5 mL via TOPICAL

## 2021-12-29 MED ORDER — FENTANYL CITRATE (PF) 250 MCG/5ML IJ SOLN
INTRAMUSCULAR | Status: DC | PRN
  Administered 2021-12-29: 100 ug via INTRAVENOUS
  Administered 2021-12-29: 50 ug via INTRAVENOUS

## 2021-12-29 MED ORDER — OXYCODONE HCL 5 MG PO TABS: 5.0000 mg | ORAL_TABLET | Freq: Once | ORAL | Status: DC | PRN

## 2021-12-29 MED ORDER — ONDANSETRON HCL 4 MG/2ML IJ SOLN
4.0000 mg | Freq: Four times a day (QID) | INTRAMUSCULAR | Status: DC | PRN
  Administered 2021-12-29: 4 mg via INTRAVENOUS
  Filled 2021-12-29: qty 2

## 2021-12-29 MED ORDER — FENTANYL CITRATE (PF) 250 MCG/5ML IJ SOLN
INTRAMUSCULAR | Status: AC
  Filled 2021-12-29: qty 5

## 2021-12-29 MED ORDER — ACETAMINOPHEN 500 MG PO TABS
1000.0000 mg | ORAL_TABLET | Freq: Four times a day (QID) | ORAL | Status: DC
  Administered 2021-12-29 – 2021-12-30 (×2): 1000 mg via ORAL
  Filled 2021-12-29 (×3): qty 2

## 2021-12-29 MED ORDER — ONDANSETRON HCL 4 MG PO TABS: 4.0000 mg | ORAL_TABLET | Freq: Three times a day (TID) | ORAL | Status: DC | PRN

## 2021-12-29 MED ORDER — ALPRAZOLAM 0.25 MG PO TABS
0.5000 mg | ORAL_TABLET | Freq: Every day | ORAL | Status: DC
  Administered 2021-12-29 – 2022-01-06 (×9): 0.5 mg via ORAL
  Filled 2021-12-29 (×9): qty 2

## 2021-12-29 MED ORDER — ARIPIPRAZOLE 10 MG PO TABS
5.0000 mg | ORAL_TABLET | Freq: Every day | ORAL | Status: DC
  Administered 2021-12-29 – 2022-01-06 (×9): 5 mg via ORAL
  Filled 2021-12-29 (×9): qty 1

## 2021-12-29 MED ORDER — PROPOFOL 10 MG/ML IV BOLUS
INTRAVENOUS | Status: DC | PRN
  Administered 2021-12-29: 80 mg via INTRAVENOUS
  Administered 2021-12-29 (×2): 20 mg via INTRAVENOUS

## 2021-12-29 SURGICAL SUPPLY — 101 items
APL SKNCLS STERI-STRIP NONHPOA (GAUZE/BANDAGES/DRESSINGS)
BAG COUNTER SPONGE SURGICOUNT (BAG) ×8 IMPLANT
BAG SPNG CNTER NS LX DISP (BAG) ×8
BAND INSRT 18 STRL LF DISP RB (MISCELLANEOUS) ×4
BAND RUBBER #18 3X1/16 STRL (MISCELLANEOUS) ×2 IMPLANT
BENZOIN TINCTURE PRP APPL 2/3 (GAUZE/BANDAGES/DRESSINGS) ×6 IMPLANT
BIT DRILL NEURO 2X3.1 SFT TUCH (MISCELLANEOUS) ×4 IMPLANT
BLADE CLIPPER SURG (BLADE) ×3 IMPLANT
BLADE SURG 11 STRL SS (BLADE) IMPLANT
BLADE SURG 15 STRL LF DISP TIS (BLADE) ×2 IMPLANT
BLADE SURG 15 STRL SS (BLADE) ×3
BLADE ULTRA TIP 2M (BLADE) ×3 IMPLANT
BUR BARREL STRAIGHT FLUTE 4.0 (BURR) ×4 IMPLANT
BUR MATCHSTICK NEURO 3.0 LAGG (BURR) ×3 IMPLANT
CAGE CERV CAPRI EXP 12X14 0D (Cage) ×1 IMPLANT
CANISTER SUCT 3000ML PPV (MISCELLANEOUS) ×5 IMPLANT
CAP CLSR POST CERV (Cap) ×11 IMPLANT
CARTRIDGE OIL MAESTRO DRILL (MISCELLANEOUS) ×4 IMPLANT
COVER MAYO STAND STRL (DRAPES) ×3 IMPLANT
DIFFUSER DRILL AIR PNEUMATIC (MISCELLANEOUS) ×5 IMPLANT
DRAIN JACKSON PRATT 10MM FLAT (MISCELLANEOUS) ×1 IMPLANT
DRAPE C-ARM 42X72 X-RAY (DRAPES) ×8 IMPLANT
DRAPE HALF SHEET 40X57 (DRAPES) ×1 IMPLANT
DRAPE LAPAROTOMY 100X72 PEDS (DRAPES) ×7 IMPLANT
DRAPE MICROSCOPE LEICA (MISCELLANEOUS) ×1 IMPLANT
DRAPE SURG 17X23 STRL (DRAPES) ×11 IMPLANT
DRILL NEURO 2X3.1 SOFT TOUCH (MISCELLANEOUS) ×3
DRSG OPSITE POSTOP 3X4 (GAUZE/BANDAGES/DRESSINGS) ×3 IMPLANT
DRSG OPSITE POSTOP 4X6 (GAUZE/BANDAGES/DRESSINGS) ×1 IMPLANT
DRSG OPSITE POSTOP 4X8 (GAUZE/BANDAGES/DRESSINGS) ×1 IMPLANT
ELECT BLADE 4.0 EZ CLEAN MEGAD (MISCELLANEOUS) ×3
ELECT REM PT RETURN 9FT ADLT (ELECTROSURGICAL) ×9
ELECTRODE BLDE 4.0 EZ CLN MEGD (MISCELLANEOUS) ×2 IMPLANT
ELECTRODE REM PT RTRN 9FT ADLT (ELECTROSURGICAL) ×4 IMPLANT
ENDPLATE VA FORTIFY 12X13 (Plate) ×1 IMPLANT
EVACUATOR 1/8 PVC DRAIN (DRAIN) ×1 IMPLANT
EVACUATOR SILICONE 100CC (DRAIN) ×1 IMPLANT
FEE INTRAOP CADWELL SUPPLY NCS (MISCELLANEOUS) IMPLANT
GAUZE 4X4 16PLY ~~LOC~~+RFID DBL (SPONGE) IMPLANT
GAUZE SPONGE 4X4 12PLY STRL (GAUZE/BANDAGES/DRESSINGS) IMPLANT
GLOVE BIO SURGEON STRL SZ 6.5 (GLOVE) ×4 IMPLANT
GLOVE BIO SURGEON STRL SZ8 (GLOVE) ×6 IMPLANT
GLOVE BIO SURGEON STRL SZ8.5 (GLOVE) ×6 IMPLANT
GLOVE BIOGEL PI IND STRL 6.5 (GLOVE) ×2 IMPLANT
GLOVE BIOGEL PI INDICATOR 6.5 (GLOVE) ×2
GLOVE EXAM NITRILE XL STR (GLOVE) IMPLANT
GOWN STRL REUS W/ TWL LRG LVL3 (GOWN DISPOSABLE) ×2 IMPLANT
GOWN STRL REUS W/ TWL XL LVL3 (GOWN DISPOSABLE) ×2 IMPLANT
GOWN STRL REUS W/TWL 2XL LVL3 (GOWN DISPOSABLE) IMPLANT
GOWN STRL REUS W/TWL LRG LVL3 (GOWN DISPOSABLE) ×3
GOWN STRL REUS W/TWL XL LVL3 (GOWN DISPOSABLE) ×3
GRAFT TRINITY ELITE LGE HUMAN (Tissue) ×1 IMPLANT
HEMOSTAT POWDER KIT SURGIFOAM (HEMOSTASIS) ×7 IMPLANT
INTRAOP CADWELL SUPPLY FEE NCS (MISCELLANEOUS) ×2
INTRAOP DISP SUPPLY FEE NCS (MISCELLANEOUS) ×3
KIT BASIN OR (CUSTOM PROCEDURE TRAY) ×6 IMPLANT
KIT TURNOVER KIT B (KITS) ×6 IMPLANT
MARKER SKIN DUAL TIP RULER LAB (MISCELLANEOUS) ×3 IMPLANT
NDL HYPO 21X1.5 SAFETY (NEEDLE) IMPLANT
NDL SPNL 18GX3.5 QUINCKE PK (NEEDLE) ×2 IMPLANT
NEEDLE HYPO 21X1.5 SAFETY (NEEDLE) ×3 IMPLANT
NEEDLE HYPO 22GX1.5 SAFETY (NEEDLE) ×6 IMPLANT
NEEDLE SPNL 18GX3.5 QUINCKE PK (NEEDLE) ×3 IMPLANT
NS IRRIG 1000ML POUR BTL (IV SOLUTION) ×6 IMPLANT
OIL CARTRIDGE MAESTRO DRILL (MISCELLANEOUS) ×3
PACK LAMINECTOMY NEURO (CUSTOM PROCEDURE TRAY) ×7 IMPLANT
PAD ARMBOARD 7.5X6 YLW CONV (MISCELLANEOUS) ×9 IMPLANT
PATTIES SURGICAL .25X.25 (GAUZE/BANDAGES/DRESSINGS) IMPLANT
PATTIES SURGICAL .5 X.5 (GAUZE/BANDAGES/DRESSINGS) ×1 IMPLANT
PATTIES SURGICAL 1X1 (DISPOSABLE) ×5 IMPLANT
PIN DISTRACTION 14MM (PIN) ×6 IMPLANT
PIN MAYFIELD SKULL DISP (PIN) ×3 IMPLANT
PLATE ANT CERV XTEND 4 LV 60 (Plate) ×1 IMPLANT
PUTTY DBM 2CC CALC GRAN (Putty) ×1 IMPLANT
ROD CVD VIRAGE 3.5X80 (Rod) ×2 IMPLANT
SCREW PA VIRAGE GOLD 4.0X22MM (Screw) ×2 IMPLANT
SCREW VAR 4.2 XD SELF DRILL 14 (Screw) ×2 IMPLANT
SCREW VIRAGE 3.5X14 (Screw) ×9 IMPLANT
SCREW VIRAGE POLY 3.5X18 (Screw) ×1 IMPLANT
SCREW XTD VAR 4.2 SELF TAP (Screw) ×2 IMPLANT
SCREW XTEND SELFTAP VAR 4.6X14 (Screw) ×4 IMPLANT
SPACER CORE FORTIFY 12X16 0D (Spacer) ×1 IMPLANT
SPIKE FLUID TRANSFER (MISCELLANEOUS) ×4 IMPLANT
SPONGE INTESTINAL PEANUT (DISPOSABLE) ×7 IMPLANT
SPONGE NEURO XRAY DETECT 1X3 (DISPOSABLE) IMPLANT
SPONGE SURGIFOAM ABS GEL 100 (HEMOSTASIS) ×2 IMPLANT
SPONGE T-LAP 4X18 ~~LOC~~+RFID (SPONGE) IMPLANT
STAPLER SKIN PROX WIDE 3.9 (STAPLE) IMPLANT
STRIP CLOSURE SKIN 1/2X4 (GAUZE/BANDAGES/DRESSINGS) ×6 IMPLANT
SUT ETHILON 2 0 FS 18 (SUTURE) IMPLANT
SUT VIC AB 0 CT1 18XCR BRD8 (SUTURE) ×2 IMPLANT
SUT VIC AB 0 CT1 27 (SUTURE) ×3
SUT VIC AB 0 CT1 27XBRD ANTBC (SUTURE) ×2 IMPLANT
SUT VIC AB 0 CT1 8-18 (SUTURE) ×6
SUT VIC AB 2-0 CP2 18 (SUTURE) ×4 IMPLANT
SUT VIC AB 3-0 SH 8-18 (SUTURE) ×5 IMPLANT
SYR 20ML LL LF (SYRINGE) ×1 IMPLANT
TOWEL GREEN STERILE (TOWEL DISPOSABLE) ×5 IMPLANT
TOWEL GREEN STERILE FF (TOWEL DISPOSABLE) ×6 IMPLANT
TRAY FOLEY MTR SLVR 16FR STAT (SET/KITS/TRAYS/PACK) ×1 IMPLANT
WATER STERILE IRR 1000ML POUR (IV SOLUTION) ×5 IMPLANT

## 2021-12-29 NOTE — Op Note (Signed)
Brief history: The patient is a 66 year old white female whose had a previous C3-4 and C4-5 anterior cervical discectomy fusion and plating in 2017 and a previous C5-6 and C6-7 anterior cervical discectomy and fusion many years ago.  She fell suffering a C5-6 fracture subluxation and kyphosis.  She failed medical management.  She was worked up with a cervical CT and cervical MRI.  I discussed the various treatment options with her.  She has decided proceed with surgery.  Preoperative diagnosis: C5-6 fracture subluxation, cervical kyphosis, cervicalgia  Postoperative diagnosis: The same  Procedure: 3 stage procedure: C6 corpectomy; open reduction internal fixation C5-6 fracture subluxation; C5-6 and C6-7 interbody arthrodesis with local morcellized autograft bone and Zimmer DBM; insertion of interbody prosthesis at the C6 corpectomy site (Stryker Capri expandable titanium interbody prosthesis); application of Gardner-Wells tongs; exploration of cervical fusion/removal of cervical hardware  Surgeon: Dr. Earle Gell  Asst.: Arnetha Massy NP  Anesthesia: Gen. endotracheal  Estimated blood loss: 150 cc  Drains: None  Complications: None  Description of procedure: The patient was brought to the operating room by the anesthesia team. General endotracheal anesthesia was induced.  I applied the Gardner-Wells tongs to the patient's calvarium.  We applied 10 pounds of traction.  The patient's anterior cervical region was then prepared with Betadine scrub and Betadine solution. Sterile drapes were applied.  The area to be incised was then injected with Marcaine with epinephrine solution. I then used a scalpel to make a transverse incision in the patient's left anterior neck, incising through her old surgical scar. I used the Metzenbaum scissors to dissect through the scar tissue and to divide the platysmal muscle and then to dissect medial to the sternocleidomastoid muscle, jugular vein, and carotid  artery. I carefully dissected down towards the anterior cervical spine identifying the esophagus and retracting it medially. Then using Kitner swabs to clear soft tissue from the anterior cervical spine.  We exposed the old anterior cervical plate from P7-T0.  We explored the old fusion by unlocking the cams then removing the screws from the old plate.  We then remove the old plate.  I inspected the arthrodesis at C3-4 and C4-5.  It appeared solid.   I then used electrocautery to detach the medial border of the longus colli muscle bilaterally from the C6 vertebral body I then inserted the Caspar self-retaining retractor underneath the longus colli muscle bilaterally to provide exposure.  I used a high-speed drill to perform a C6 corpectomy, decompressing the thecal sac.  Carefully drilled out laterally exposing the bilateral vertebral arteries and removing the lateral vertebral body.  This provided mobility to the corpectomy site.  We partially reduced the fracture/kyphosis by using the interbody spreaders to distract the corpectomy site.  We now turned our to attention to the interbody fusion.  I inserted a Stryker titanium expandable interbody prosthesis into the C6 corpectomy site.  We expanded the prosthesis further reducing the patient's fracture subluxation/kyphosis.  At this point I did not put the anterior cervical plate on because I wanted to further reduce the patient's fracture during the next, i.e. posterior stage of the operation.  We then obtained hemostasis using bipolar electrocautery. We irrigated the wound out with bacitracin solution. We then removed the retractor. We inspected the esophagus for any damage. There was none apparent. We then reapproximated patient's platysmal muscle with interrupted 3-0 Vicryl suture. We then reapproximated the subcutaneous tissue with interrupted 3-0 Vicryl suture. The skin was reapproximated with Steri-Strips and benzoin. The wound  was then covered  with bacitracin ointment. A sterile dressing was applied. The drapes were removed. All sponge instrument and needle counts were reportedly correct at this stage of this case.

## 2021-12-29 NOTE — Anesthesia Procedure Notes (Addendum)
Procedure Name: Intubation Date/Time: 12/29/2021 11:27 AM Performed by: Wilburn Cornelia, CRNA Pre-anesthesia Checklist: Patient identified, Emergency Drugs available, Suction available and Patient being monitored Patient Re-evaluated:Patient Re-evaluated prior to induction Oxygen Delivery Method: Circle System Utilized Preoxygenation: Pre-oxygenation with 100% oxygen Induction Type: IV induction Ventilation: Mask ventilation without difficulty Laryngoscope Size: Glidescope and 4 Tube type: Oral Tube size: 7.0 mm Number of attempts: 1 Airway Equipment and Method: Stylet Placement Confirmation: ETT inserted through vocal cords under direct vision, positive ETCO2 and breath sounds checked- equal and bilateral Secured at: 22 cm Tube secured with: Tape Dental Injury: Teeth and Oropharynx as per pre-operative assessment

## 2021-12-29 NOTE — Op Note (Signed)
Subjective: The patient is somnolent but arousable.  She is in no apparent distress.  Objective: Vital signs in last 24 hours: Temp:  [97.6 F (36.4 C)] 97.6 F (36.4 C) (06/07 0918) Pulse Rate:  [78] 78 (06/07 0918) Resp:  [20] 20 (06/07 0918) BP: (131)/(76) 131/76 (06/07 0918) SpO2:  [99 %] 99 % (06/07 0918) Weight:  [59.9 kg] 59.9 kg (06/07 0918) Estimated body mass index is 23.38 kg/m as calculated from the following:   Height as of this encounter: '5\' 3"'$  (1.6 m).   Weight as of this encounter: 59.9 kg.   Intake/Output from previous day: No intake/output data recorded. Intake/Output this shift: Total I/O In: 800 [I.V.:800] Out: 100 [Urine:100]  Physical exam the patient is somnolent but arousable.  She is moving all 4 extremities well.  She has weakness in her left hand.  Lab Results: Recent Labs    12/29/21 1737  HGB 10.2*  HCT 30.0*   BMET Recent Labs    12/29/21 1737  NA 137  K 3.4*    Studies/Results: DG Cervical Spine 1 View  Result Date: 12/29/2021 CLINICAL DATA:  Anterior cervical decompression and fusion. EXAM: DG CERVICAL SPINE - 1 VIEW COMPARISON:  CT cervical spine 10/29/2021. FINDINGS: Posterior and anterior fusion hardware seen from the level of C3 extending inferiorly past the level of C5. Hardware is incompletely imaged. Patient is intubated. Enteric tube is present. Alignment is grossly anatomic. IMPRESSION: 1. Posterior and anterior fusion hardware is present from the level of C3 extending at least to the level of C5. Alignment is grossly anatomic. Electronically Signed   By: Ronney Asters M.D.   On: 12/29/2021 19:27   DG Cervical Spine 2 or 3 views  Result Date: 12/29/2021 CLINICAL DATA:  Anterior cervical decompression EXAM: CERVICAL SPINE - 2-3 VIEW COMPARISON:  10/29/2021 FLUOROSCOPY TIME:  Radiation Exposure Index (as provided by the fluoroscopic device): 5.44 mGy If the device does not provide the exposure index: Fluoroscopy Time:  58 seconds  Number of Acquired Images:  4 FINDINGS: Initial images again demonstrate anterior fixation from C3-C5 with significant increased kyphosis at C5-6 similar to that noted on prior CT examination. This hardware was subsequently removed as well as C5-6 corpectomy with placement of interbody prosthesis at C6. Improvement of the previously seen kyphosis is noted. IMPRESSION: C5-6 corpectomy with placement interbody prosthesis. Electronically Signed   By: Inez Catalina M.D.   On: 12/29/2021 19:33   DG C-Arm 1-60 Min-No Report  Result Date: 12/29/2021 Fluoroscopy was utilized by the requesting physician.  No radiographic interpretation.   DG C-Arm 1-60 Min-No Report  Result Date: 12/29/2021 Fluoroscopy was utilized by the requesting physician.  No radiographic interpretation.   DG C-Arm 1-60 Min-No Report  Result Date: 12/29/2021 Fluoroscopy was utilized by the requesting physician.  No radiographic interpretation.   DG C-Arm 1-60 Min-No Report  Result Date: 12/29/2021 Fluoroscopy was utilized by the requesting physician.  No radiographic interpretation.   DG C-Arm 1-60 Min-No Report  Result Date: 12/29/2021 Fluoroscopy was utilized by the requesting physician.  No radiographic interpretation.   DG C-Arm 1-60 Min-No Report  Result Date: 12/29/2021 Fluoroscopy was utilized by the requesting physician.  No radiographic interpretation.   DG C-Arm 1-60 Min-No Report  Result Date: 12/29/2021 Fluoroscopy was utilized by the requesting physician.  No radiographic interpretation.    Assessment/Plan: Status post anterior and posterior cervical fusion.  The patient seems to be doing well.  I think her left hand and weakness will  likely improve.  We will give a time.  LOS: 0 days     Rachel Duran 12/29/2021, 7:40 PM

## 2021-12-29 NOTE — Plan of Care (Signed)
  Problem: Education: Goal: Knowledge of General Education information will improve Description: Including pain rating scale, medication(s)/side effects and non-pharmacologic comfort measures Outcome: Progressing   Problem: Clinical Measurements: Goal: Respiratory complications will improve Outcome: Progressing   Problem: Elimination: Goal: Will not experience complications related to urinary retention Outcome: Progressing   Problem: Skin Integrity: Goal: Risk for impaired skin integrity will decrease Outcome: Progressing

## 2021-12-29 NOTE — Anesthesia Preprocedure Evaluation (Addendum)
Anesthesia Evaluation  Patient identified by MRN, date of birth, ID band Patient awake    Reviewed: Allergy & Precautions, NPO status , Patient's Chart, lab work & pertinent test results  History of Anesthesia Complications (+) PONV and history of anesthetic complications  Airway Mallampati: II  TM Distance: >3 FB Neck ROM: Limited    Dental no notable dental hx. (+) Dental Advisory Given   Pulmonary neg pulmonary ROS,    Pulmonary exam normal breath sounds clear to auscultation       Cardiovascular negative cardio ROS Normal cardiovascular exam Rhythm:Regular Rate:Normal     Neuro/Psych  Headaches, PSYCHIATRIC DISORDERS Anxiety Depression Bipolar Disorder Substance use: morphine 15 mg bidC 5,6 fracture    GI/Hepatic GERD  ,  Endo/Other  [name]: Norvasc  Renal/GU negative Renal ROS  negative genitourinary   Musculoskeletal  (+) Arthritis , Rheumatoid disorders,  Fibromyalgia -, narcotic dependentChronic Morphine use   Abdominal   Peds negative pediatric ROS (+)  Hematology negative hematology ROS (+)   Anesthesia Other Findings   Reproductive/Obstetrics negative OB ROS                        Anesthesia Physical Anesthesia Plan  ASA: 3  Anesthesia Plan: General   Post-op Pain Management: Dilaudid IV and Ofirmev IV (intra-op)*   Induction: Intravenous  PONV Risk Score and Plan: 3 and Ondansetron, Dexamethasone, Midazolam and Treatment may vary due to age or medical condition  Airway Management Planned: Oral ETT and Video Laryngoscope Planned  Additional Equipment: Arterial line  Intra-op Plan:   Post-operative Plan: Possible Post-op intubation/ventilation  Informed Consent: I have reviewed the patients History and Physical, chart, labs and discussed the procedure including the risks, benefits and alternatives for the proposed anesthesia with the patient or authorized  representative who has indicated his/her understanding and acceptance.     Dental advisory given  Plan Discussed with: CRNA and Surgeon  Anesthesia Plan Comments:       Anesthesia Quick Evaluation

## 2021-12-29 NOTE — H&P (Signed)
Subjective: The patient is a 66 year old white female whose had a previous C5-6 and C6-7 anterior discectomy and interbody fusion in 2004.  She had a C3-4 and C4-5 anterior cervical discectomy fusion plating 2017.  The patient took a fall on 10/29/2021 suffering a C5-6 fracture.  Has had persistent neck pain.  She has failed medical management.  She was worked up with a cervical MRI and cervical CT.  We discussed the various treatment options.  She has decided proceed with surgery. Past Medical History:  Diagnosis Date   Abdominal pain last 4 months   and nausea also   Allergy    Anemia    history of   Anxiety    Bipolar disorder (HCC)    atpical bipolar disorder   Cervical disc disease limited rom turning to left   hx. C6- C7 -hx. past fusion(bone graft used)   Cholecystitis    Chronic pain    DDD (degenerative disc disease), lumbar 09/2015   dextroscoliosis with multilevel DDD and facet arthrosis most notable for R foraminal disc protrusion L4/5 producing severe R neural foraminal stenosis abutting R L4 nerve root, moderate spinal canal and mild lat recesss and R neural foraminal stenosis L3/4 (MRI)   Depression    bipolar depression   Disorders of porphyrin metabolism    Felon of finger of left hand 11/10/2016   Fibromyalgia    GERD (gastroesophageal reflux disease)    Headache    occasionally   Internal hemorrhoids    Interstitial cystitis 06/06/2012   hx.   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 (Dr Baird Cancer retinologist)   Tuberculosis     Past Surgical History:   Procedure Laterality Date   ANTERIOR CERVICAL DECOMP/DISCECTOMY FUSION  2004   C5/6, C6/7   ANTERIOR CERVICAL DECOMP/DISCECTOMY FUSION  02/2016   C3/4, C4/5 with plating [name])   AUGMENTATION MAMMAPLASTY Bilateral 03/25/2010   BREAST ENHANCEMENT SURGERY  2010   BREAST IMPLANT EXCHANGE  10/2014   exchange saline implants, B mastopexy/capsulorraphy ([place] [facility])   BUNIONECTOMY Bilateral yrs ago   [place]   x 1   CHOLECYSTECTOMY  06/11/2012   Procedure: LAPAROSCOPIC CHOLECYSTECTOMY WITH INTRAOPERATIVE CHOLANGIOGRAM;  Surgeon: [name], MD,FACS;  Location: [facility];  Service: General;  Laterality: N/A;   COLONOSCOPY  02/2018   done for positive cologuard - WNL, rpt 10 yrs [facility] Plan)   CYSTOSCOPY     ERCP  05/22/2012   Procedure: ENDOSCOPIC RETROGRADE CHOLANGIOPANCREATOGRAPHY (ERCP);  Surgeon: [name], MD,FACG;  Location: [facility];  Service: Endoscopy;  Laterality: N/A;   ERCP N/A 09/17/2013   Procedure: ENDOSCOPIC RETROGRADE CHOLANGIOPANCREATOGRAPHY (ERCP);  Surgeon: [name], MD;  Location: [facility];  Service: Endoscopy;  Laterality: N/A;   ERCP N/A 09/27/2018   Procedure: ENDOSCOPIC RETROGRADE CHOLANGIOPANCREATOGRAPHY (ERCP);  Surgeon: [name], MD;  Location: [facility];  Service: Endoscopy;  Laterality: N/A;   ESOPHAGOGASTRODUODENOSCOPY  09/2016   WNL. esophagus dilated [facility] Plan)   HEMORRHOID BANDING  09/23/2013   --  Dr. Greer Pickerel   HERNIA REPAIR     inguinal   HYSTEROSCOPY W/ ENDOMETRIAL ABLATION     KNEE ARTHROSCOPY Right 04/06/2021   Procedure: RIGHT KNEE ARTHROSCOPY WITH DEBRIDEMENT;  Surgeon: [name], MD;  Location: [place];  Service: Orthopedics;  Laterality: Right;   NASAL SINUS SURGERY     x5   PARTIAL KNEE ARTHROPLASTY Right 06/01/2020   Procedure: Right knee patellofemoral replacement;  Surgeon: [name], MD;  Location: [place];  Service: Orthopedics;   Laterality: Right;   REMOVAL OF STONES  09/27/2018   Procedure: REMOVAL OF STONES;  Surgeon: [name], MD;  Location: [facility];  Service: Endoscopy;;   SPHINCTEROTOMY  09/27/2018   Procedure: SPHINCTEROTOMY;  Surgeon: [name], MD;  Location: [facility];  Service: Endoscopy;;   TONSILLECTOMY     removed as a child   TOTAL HIP ARTHROPLASTY Left 03/12/2019   Procedure: LEFT TOTAL HIP ARTHROPLASTY ANTERIOR APPROACH;  [name], [name], MD)   UPPER GASTROINTESTINAL ENDOSCOPY      Allergies  Allergen Reactions   Celebrex [Celecoxib] Rash    Headaches   Inh [Isoniazid] Other (See Comments)    Hepatitis   Cymbalta [Duloxetine Hcl] Other (See Comments)    Urinary retention   Nucynta [Tapentadol] Other (See Comments)    Agitation, jerking in legs   Silenor [Doxepin Hcl] Other (See Comments)    nightmare, dizziness, stumbling upon walking, not effective   Benzoin Dermatitis   Cephalexin Nausea And Vomiting   Fentanyl Other (See Comments)    Cold intolerance, arms twitching, insomnia, fatigue   Codeine Phosphate Nausea And Vomiting and Other (See Comments)    Severe n&v   Lithium Other (See Comments)    Sores in mouth    Meperidine Hcl Rash and Other (See Comments)    REACTION: red skin   Sulfamethoxazole Rash    Social History   Tobacco Use   Smoking status: Never    Passive exposure: Never   Smokeless tobacco: Never  Substance Use Topics   Alcohol use: No    Alcohol/week: 0.0 standard drinks    Family History  Problem Relation Age of Onset   CAD Father 45       MI, nonsmoker   Esophageal cancer Father 4   Stomach cancer Father    Scleroderma Mother    Hypertension Mother    Esophageal cancer Paternal Grandfather    Stomach cancer Paternal Grandfather    Diabetes Maternal Grandmother    Arthritis Brother    Stroke Neg Hx    Colon cancer Neg Hx    Rectal cancer Neg Hx    Prior to Admission medications   Medication Sig Start Date End  Date Taking? Authorizing Provider  ALPRAZolam Duanne Moron) 1 MG tablet Take 0.5 mg by mouth at bedtime. 09/22/17  Yes Ria Bush, MD  amLODipine (NORVASC) 2.5 MG tablet TAKE ONE TABLET BY MOUTH DAILY **TAKE WITH '5MG'$  TABLET FOR DOSE OF 7.'5MG'$  DAILY** 12/07/21  Yes Ofilia Neas, PA-C  amLODipine (NORVASC) 5 MG tablet Take 1 tablet (5 mg total) by mouth daily. 08/30/21  Yes Ofilia Neas, PA-C  amphetamine-dextroamphetamine (ADDERALL XR) 30 MG 24 hr capsule TAKE 1 CAPSUEL BY MOUTH EACH MORNING 12/17/21  Yes Ria Bush, MD  ARIPiprazole (ABILIFY) 5 MG tablet Take 5 mg by mouth at bedtime.   Yes [provider]  Calcium Carbonate-Vitamin D (CALTRATE 600+D PO) Take 1 tablet by mouth daily.  Yes [provider]  cyanocobalamin (,VITAMIN B-12,) 1000 MCG/ML injection INJECT 1ML INTO THE MUSCLE EVERY 21 DAYSAS DIRECTED 11/04/21  Yes Ria Bush, MD  diclofenac Sodium (VOLTAREN) 1 % GEL APPLY TWO TO FOUR GRAMS TO AFFECTED JOINT UP TO FOUR TIMES A DAY Patient taking differently: Apply 1 g topically in the morning, at noon, and at bedtime. 07/29/21  Yes Ofilia Neas, PA-C  Eszopiclone 3 MG TABS TAKE 1 TABLET BY MOUTH AT BEDTIME TAKE IMMEDIATLEY BEFORE BEDTIME AS DIRECTED. 07/15/21  Yes Ria Bush, MD  ferrous sulfate 325 (65 FE) MG tablet Take 325 mg by mouth every Monday, Wednesday, and Friday.    Yes [provider]  fluticasone (FLONASE) 50 MCG/ACT nasal spray PLACE 2 SPRAYS INTO BOTH NOSTRILS DAILY AS NEEDED (SEASONAL ALLERGIES) 11/26/21  Yes Ria Bush, MD  lamoTRIgine (LAMICTAL) 200 MG tablet Take 200 mg by mouth daily. 03/14/19  Yes [provider]  linaclotide (LINZESS) 290 MCG CAPS capsule Take 1 capsule (290 mcg total) by mouth daily. 09/23/21  Yes Ria Bush, MD  morphine (MSIR) 15 MG tablet TAKE 1 TABLET BY MOUTH THREE TIMES DAILYAS NEEDED FOR SEVERE OR MODERATE PAIN 12/27/21  Yes Ria Bush, MD  MOVANTIK 25 MG TABS tablet TAKE ONE  TABLET BY MOUTH EVERY DAY 10/08/21  Yes Ria Bush, MD  Multiple Vitamins-Minerals (PRESERVISION AREDS 2 PO) Take 1 capsule by mouth in the morning and at bedtime.   Yes [provider]  ondansetron (ZOFRAN) 4 MG tablet TAKE ONE TABLET EVERY EIGHT HOURS AS NEEDED FOR NAUSEA / VOMITING 12/21/21  Yes Ria Bush, MD  pantoprazole (PROTONIX) 40 MG tablet TAKE ONE TABLET TWICE A DAY WITH MEALS 12/09/21  Yes [name], MD  pentosan polysulfate (ELMIRON) 100 MG capsule Take 200 mg by mouth 2 (two) times daily.    Yes [provider]  polyethylene glycol (MIRALAX / GLYCOLAX) 17 g packet Take 17 g by mouth 2 (two) times daily.   Yes [provider]  Prasterone (INTRAROSA) 6.5 MG INST Place 1 suppository vaginally at bedtime. 04/29/21  Yes Amundson Raliegh Ip, MD  pregabalin (LYRICA) 150 MG capsule TAKE 1 CAPSULE BY MOUTH TWICE DAILY 12/14/21  Yes [name], Abel Presto, MD  PREVIDENT 5000 BOOSTER PLUS 1.1 % PSTE Place 1 application. onto teeth at bedtime. 11/25/19  Yes [provider]  sodium chloride (OCEAN) 0.65 % SOLN nasal spray Place 1 spray into both nostrils as needed for congestion.   Yes [provider]  tiZANidine (ZANAFLEX) 2 MG tablet TAKE 1 TABLET BY MOUTH EVERY 8 HOURS AS NEEDED 11/26/21  Yes Ria Bush, MD  ursodiol (ACTIGALL) 300 MG capsule Take 1 capsule (300 mg total) by mouth 2 (two) times daily. 12/09/21  Yes [name], MD  amoxicillin (AMOXIL) 500 MG tablet Take 4 tablets (2,'000mg'$ ) by mouth 30-60 minutes prior to dental cleaning. 06/12/20   Magnant, Charles L, PA-C  amoxicillin-clavulanate (AUGMENTIN) 875-125 MG tablet Take 1 tablet by mouth 2 (two) times daily. Patient not taking: Reported on 12/28/2021 12/16/21   Abner Greenspan, MD  aspirin 81 MG chewable tablet Chew 1 tablet (81 mg total) by mouth daily. Patient not taking: Reported on 12/28/2021 06/02/20   Magnant, Gerrianne Scale, PA-C     Review of Systems  Positive  ROS: As above  All other systems have been reviewed and were otherwise negative with the exception of those mentioned in the HPI and as above.  Objective: Vital signs in last  24 hours: Temp:  [97.6 F (36.4 C)] 97.6 F (36.4 C) (06/07 0918) Pulse Rate:  [78-87] 78 (06/07 0918) Resp:  [16-20] 20 (06/07 0918) BP: (125-131)/(74-76) 131/76 (06/07 0918) SpO2:  [99 %] 99 % (06/07 0918) Weight:  [59.9 kg-60.3 kg] 59.9 kg (06/07 0918) Estimated body mass index is 23.38 kg/m as calculated from the following:   Height as of this encounter: '5\' 3"'$  (1.6 m).   Weight as of this encounter: 59.9 kg.   General Appearance: Alert Head: Normocephalic, without obvious abnormality, atraumatic Eyes: PERRL, conjunctiva/corneas clear, EOM's intact,    Ears: Normal  Throat: Normal  Neck: Kyphotic, very limited range of motion.  Her anterior cervical incision is well-healed. Back: unremarkable Lungs: Clear to auscultation bilaterally, respirations unlabored Heart: Regular rate and rhythm, no murmur, rub or gallop Abdomen: Soft, non-tender Extremities: Extremities normal, atraumatic, no cyanosis or edema Skin: unremarkable  NEUROLOGIC:   Mental status: alert and oriented,Motor Exam - grossly normal Sensory Exam - grossly normal Reflexes:  Coordination - grossly normal Gait - grossly normal Balance - grossly normal Cranial Nerves: I: smell Not tested  II: visual acuity  OS: Normal  OD: Normal   II: visual fields Full to confrontation  II: pupils Equal, round, reactive to light  III,VII: ptosis None  III,IV,VI: extraocular muscles  Full ROM  V: mastication Normal  V: facial light touch sensation  Normal  V,VII: corneal reflex  Present  VII: facial muscle function - upper  Normal  VII: facial muscle function - lower Normal  VIII: hearing Not tested  IX: soft palate elevation  Normal  IX,X: gag reflex Present  XI: trapezius strength  5/5  XI: sternocleidomastoid strength 5/5  XI: neck  flexion strength  5/5  XII: tongue strength  Normal    Data Review Lab Results  Component Value Date   WBC 6.8 12/22/2021   HGB 12.5 12/22/2021   HCT 38.1 12/22/2021   MCV 86.4 12/22/2021   PLT 301 12/22/2021   Lab Results  Component Value Date   NA 136 12/22/2021   K 4.3 12/22/2021   CL 100 12/22/2021   CO2 29 12/22/2021   BUN <5 (L) 12/22/2021   CREATININE 0.55 12/22/2021   GLUCOSE 105 (H) 12/22/2021   Lab Results  Component Value Date   INR 1.1 10/29/2021    Assessment/Plan: C5-6 fracture subluxation, cervical kyphosis, cervicalgia: I have discussed the situation with the patient.  We discussed the various treatment options including surgery.  I have described the surgical treatment option of a anterior cervical corpectomy with interbody prosthesis and anterior instrumentation and a second stage posterior cervical instrumentation and fusion.  I have shown her surgical models.  I have given her a surgical pamphlet.  We have discussed the risk, benefits, alternatives, expected postop course, and likelihood of achieving our goals with surgery.  I have answered all her questions.  She has decided proceed with surgery.  [name] 12/29/2021 11:08 AM

## 2021-12-29 NOTE — Progress Notes (Signed)
Orthopedic Tech Progress Note Patient Details:  Rachel Duran 1956-06-26 [id number]  Patient ID: Rachel Duran, female   DOB: 1955-10-02, 66 y.o.   MRN: [id number] C-[place] dropped off with PACU RN.  Rachel Duran 12/29/2021, 8:17 PM

## 2021-12-29 NOTE — Op Note (Signed)
Brief history: The patient is a 66 year old white female whose had previous anterior cervicectomy fusion and plating.  She fell suffering a C5-6 fracture subluxation.  She failed medical management.  I discussed the various treatment options with her.  She has decided proceed with surgery.  This is the second stage of the operation.  Preop diagnosis: C5-6 fracture subluxation, cervical kyphosis, cervicalgia  Postop diagnosis: The same  Procedure: C5-6 laminectomy/facetectomy for open reduction of C5-6 fracture; C3-4, C4-5, C5-6, C6-7, C7-T1 posterior lateral arthrodesis with local morselized autograft bone and Trinity bone extender; posterior instrumentation C3-T1 bilaterally with Zimmer titanium screws and rods.  Surgeon: Dr. Earle Gell  Assistant: Arnetha Massy, NP  Anesthesia: General tracheal  Estimated blood loss: 150 cc  Specimens: None  Drains: 1 Hemovac in the epidural space  Complications: None  Description of procedure: This is the second stage of this operation.  I remove the Gardner-Wells tongs from the patient's calvarium.  I applied the [place] three-point headrest.  The patient was carefully turned to the prone position on the chest rolls.  We confirmed her good alignment using intraoperative fluoroscopy.  The patient's suboccipital region was then shaved with the clippers and this region, her posterior cervical and thoracic region were prepared with Betadine scrub and Betadine solution.  Sterile drapes were applied.  I then injected the area to be incised with Marcaine with epinephrine solution.  I then used a scalpel to make a linear midline incision from C3-T1.  I used electrocautery to perform a bilateral subperiosteal dissection exposing the spinous process lamina and facets at C3-4, C4-5, C5-6, C6-7 and C7-T1.  We used the [place] and cerebellar retractors for exposure.  The fracture was evident at C5-6 where she had a high lamina fracture and a floating lateral  mass at C6 on the right.  In order to decompress the thecal sac and to allow for further reduction of her fracture I used a high-speed drill to partially remove the facets bilaterally at C5-6.  This decompressed the thecal sac and the bilateral C6 nerve root.  At this point the joint was quite mobile.  This allowed [place] to further reduce the patient's cervical kyphosis/fracture subluxation.  And now turned my attention to the instrumentation.  I used electrocautery to expose the lateral masses bilaterally at C3, C4, C5, C6 and C7 as well as the pedicle at T1.  Using intraoperative fluoroscopy I drilled a 14 mm hole in the lateral masses bilaterally at C3, C4, C5 C7 and on the left at C6.  We could not put a screw in the lateral mass on the right at C6 because the lateral mass was not attached to the rest of the bone and extremely loose.  I then removed the drill I probed inside the drilled holes and ruled out cortical breaches.  I inserted 14 mm lateral mass screws into the lateral masses bilaterally at C3, C4, C5 C7 and on the left at C6.  Then using intraoperative fluoroscopy I identified the bilateral T1 pedicles.  I drilled down the pedicles using fluoroscopy guidance and inserted a 4.0 x 22 mm pedicle screw into the the T2 pedicles bilaterally.  I then connected the unilateral screws with a rod.  We secured this in place with the caps.  We tightened the caps appropriately.  This completed the instrumentation bilaterally from C3-T1.  We now turned our attention to the posterior lateral arthrodesis at C3-4, C4-5, C5-6, C6-7 and C7-T1.  I used a high-speed drill to  decorticate the lateral masses facets etc. at these levels.  We then laid a combination of local morselized autograft bone we obtained during the decompression as well as Trinity bone graft extender over these decorticated structures completing the posterior lateral thesis at C3-4, C4-5, C5-6, C6-7 and C7-T1.  We then obtained hemostasis using  bipolar cautery.  We irrigated out the wound with saline solution.  I placed a medium Hemovac drain in the epidural space and tunneled it out through a separate stab wound.  We then remove the cerebellar retractors.  We reapproximated patient's cervical thoracic fascia with interrupted 0 Vicryl suture.  We reapproximated the subcutaneous tissue with interrupted 2-0 Vicryl suture.  We reapproximated the skin with Steri-Strips.  By report all sponge, instrument, and needle counts were correct at the end this case.  This completed the second stage of the operation

## 2021-12-29 NOTE — Anesthesia Postprocedure Evaluation (Signed)
Anesthesia Post Note  Patient: Rachel Duran  Procedure(s) Performed: CERVICAL FIVE-SIX CORPECTOMY, POSTERIOR CERVICAL FUSION CERVICAL THREE-SEVEN ANTERIOR CERVICAL DECOMPRESSION/ DISCECTOMY FUSION CERVICAL THREE - SEVEN     Patient location during evaluation: PACU Anesthesia Type: General Level of consciousness: sedated and patient cooperative Pain management: pain level controlled Vital Signs Assessment: post-procedure vital signs reviewed and stable Respiratory status: spontaneous breathing Cardiovascular status: stable Anesthetic complications: no   No notable events documented.  Last Vitals:  Vitals:   12/29/21 2051 12/29/21 2150  BP: (!) 144/78 (!) 109/57  Pulse: 84 61  Resp: 11 13  Temp: 36.5 C   SpO2: 98% 98%    Last Pain:  Vitals:   12/29/21 2211  TempSrc:   PainSc: [place]

## 2021-12-29 NOTE — Op Note (Signed)
Brief history the: The patient is a 66 year old white female who has suffered a C5-6 fracture subluxation and cervical kyphosis.  She failed medical management.  She decided proceed with surgery.  Preop diagnosis: C5-6 fracture subluxation, cervical kyphosis, cervicalgia  Postop diagnosis: The same  Procedure: Third stage: Anterior cervical plate C4-T1 (globus titanium anterior cervical plate and screws)  Surgeon: Dr. Earle Gell  Assistant: Arnetha Massy, NP  Anesthesia: General tracheal  Estimated blood loss: 50 cc  Specimens: None  Drains: None  Complications: None  Description of procedure: After the completion of the second stage of the operation the patient was carefully turned to the supine position.  I remove the [place]field headrest from the patient's calvarium.  The patient's anterior cervical region was then prepared with Betadine scrub and Betadine solution.  Sterile drapes were applied.  I then used the Metzenbaum scissors to ligate the patient's fresh sutures in her left anterior neck incision.  I then dissected down to the anterior cervical spine.  I inserted the Caspar retractor for exposure.  I inspected the patient's interbody prosthesis at the corpectomy site.  It appeared well-placed.  The thecal sac was well decompressed.  I then selected the appropriate length globus titanium anterior cervical plate.  I laid it along the anterior aspect of the vertebral body from C4-T1.  I secured in place using the old screw holes at C4 and C5 using 14 mm rescue screws.  I drilled new holes at C7 and T1 and secured the inferior aspect the plate using 14 mm 3.5 millimeter screws.  We obtained intraoperative radiograph.  It demonstrated good position of the upper plate and screws.  The lower plate screws looked good in vivo.  We there is room for secured the screws the plate by locking each cam.  This completed the instrumentation.  We then obtain hemostasis with bipolar cautery.  We  irrigated the wound out with bacitracin solution.  We then remove the retractor.  I inspected the esophagus for any damage.  There was none apparent.  We then reapproximated the patient's platysmal muscle with interrupted 3-0 Vicryl suture.  We reapproximated the subcutaneous tissue with interrupted 3-0 Vicryl suture.  We reapproximated the skin with Steri-Strips.  The wound was then coated with bacitracin ointment.  A sterile dressing was applied.  The drapes were removed.  By report all sponge, instrument, and needle counts were correct at the end of this case.

## 2021-12-29 NOTE — Transfer of Care (Signed)
Immediate Anesthesia Transfer of Care Note  Patient: Rachel Duran  Procedure(s) Performed: CERVICAL FIVE-SIX CORPECTOMY, POSTERIOR CERVICAL FUSION CERVICAL THREE-SEVEN ANTERIOR CERVICAL DECOMPRESSION/ DISCECTOMY FUSION CERVICAL THREE - SEVEN  Patient Location: PACU  Anesthesia Type:General  Level of Consciousness: awake, oriented and sedated  Airway & Oxygen Therapy: Patient connected to face mask oxygen  Post-op Assessment: Report given to RN and Post -op Vital signs reviewed and stable  Post vital signs: Reviewed and stable  Last Vitals:  Vitals Value Taken Time  BP 149/102 12/29/21 1935  Temp    Pulse 110 12/29/21 1936  Resp 19 12/29/21 1936  SpO2 100 % 12/29/21 1936  Vitals shown include unvalidated device data.  Last Pain:  Vitals:   12/29/21 0943  TempSrc:   PainSc: 6       Patients Stated Pain Goal: 4 (32/02/33 4356)  Complications: No notable events documented.

## 2021-12-29 NOTE — Anesthesia Procedure Notes (Signed)
Arterial Line Insertion Start/End6/01/2022 10:10 AM Performed by: Janene Harvey, CRNA, CRNA  Patient location: Pre-op. Preanesthetic checklist: patient identified, IV checked, risks and benefits discussed and surgical consent Lidocaine 1% used for infiltration Right, radial was placed Catheter size: 20 G Hand hygiene performed  and maximum sterile barriers used  Allen's test indicative of satisfactory collateral circulation Attempts: 1 Procedure performed without using ultrasound guided technique. Following insertion, Biopatch and dressing applied. Post procedure assessment: unchanged  Patient tolerated the procedure well with no immediate complications.

## 2021-12-30 ENCOUNTER — Inpatient Hospital Stay (HOSPITAL_COMMUNITY): Payer: Medicare Other | Admitting: Anesthesiology

## 2021-12-30 ENCOUNTER — Encounter (HOSPITAL_COMMUNITY): Payer: Self-pay | Admitting: Neurosurgery

## 2021-12-30 ENCOUNTER — Inpatient Hospital Stay (HOSPITAL_COMMUNITY): Payer: Medicare Other

## 2021-12-30 ENCOUNTER — Encounter (HOSPITAL_COMMUNITY)
Admission: RE | Disposition: A | Discharge status: Rehabilitation Facility | Payer: Self-pay | Source: Home / Self Care | Attending: Neurosurgery

## 2021-12-30 ENCOUNTER — Other Ambulatory Visit: Payer: Self-pay

## 2021-12-30 DIAGNOSIS — M797 Fibromyalgia: Secondary | ICD-10-CM

## 2021-12-30 DIAGNOSIS — T84226A Displacement of internal fixation device of vertebrae, initial encounter: Secondary | ICD-10-CM

## 2021-12-30 DIAGNOSIS — M1990 Unspecified osteoarthritis, unspecified site: Secondary | ICD-10-CM

## 2021-12-30 LAB — CBC WITH DIFFERENTIAL/PLATELET
Abs Immature Granulocytes: 0.11 10*3/uL — ABNORMAL HIGH (ref 0.00–0.07)
Basophils Absolute: 0 10*3/uL (ref 0.0–0.1)
Basophils Relative: 0 %
Eosinophils Absolute: 0 10*3/uL (ref 0.0–0.5)
Eosinophils Relative: 0 %
HCT: 28.3 % — ABNORMAL LOW (ref 36.0–46.0)
Hemoglobin: 9.8 g/dL — ABNORMAL LOW (ref 12.0–15.0)
Immature Granulocytes: 1 %
Lymphocytes Relative: 7 %
Lymphs Abs: 1 10*3/uL (ref 0.7–4.0)
MCH: 29.1 pg (ref 26.0–34.0)
MCHC: 34.6 g/dL (ref 30.0–36.0)
MCV: 84 fL (ref 80.0–100.0)
Monocytes Absolute: 0.5 10*3/uL (ref 0.1–1.0)
Monocytes Relative: 3 %
Neutro Abs: 13.1 10*3/uL — ABNORMAL HIGH (ref 1.7–7.7)
Neutrophils Relative %: 89 %
Platelets: 219 10*3/uL (ref 150–400)
RBC: 3.37 MIL/uL — ABNORMAL LOW (ref 3.87–5.11)
RDW: 13.1 % (ref 11.5–15.5)
WBC: 14.8 10*3/uL — ABNORMAL HIGH (ref 4.0–10.5)
nRBC: 0 % (ref 0.0–0.2)

## 2021-12-30 LAB — BASIC METABOLIC PANEL
Anion gap: 8 (ref 5–15)
BUN: <5 mg/dL — ABNORMAL LOW (ref 8–23)
CO2: 24 mmol/L (ref 22–32)
Calcium: 8.7 mg/dL — ABNORMAL LOW (ref 8.9–10.3)
Chloride: 99 mmol/L (ref 98–111)
Creatinine, Ser: 0.39 mg/dL — ABNORMAL LOW (ref 0.44–1.00)
GFR, Estimated: >60 mL/min (ref >60)
Glucose, Bld: 136 mg/dL — ABNORMAL HIGH (ref 70–99)
Potassium: 3.8 mmol/L (ref 3.5–5.1)
Sodium: 131 mmol/L — ABNORMAL LOW (ref 135–145)

## 2021-12-30 SURGERY — POSTERIOR LUMBAR FUSION 1 WITH HARDWARE REMOVAL
Anesthesia: General

## 2021-12-30 MED ORDER — GLYCOPYRROLATE PF 0.2 MG/ML IJ SOSY
PREFILLED_SYRINGE | INTRAMUSCULAR | Status: AC
  Filled 2021-12-30: qty 1

## 2021-12-30 MED ORDER — MENTHOL 3 MG MT LOZG: 1.0000 | LOZENGE | OROMUCOSAL | Status: DC | PRN

## 2021-12-30 MED ORDER — MIDAZOLAM HCL 2 MG/2ML IJ SOLN
INTRAMUSCULAR | Status: AC
  Filled 2021-12-30: qty 2

## 2021-12-30 MED ORDER — HYDROMORPHONE HCL 1 MG/ML IJ SOLN: 0.2500 mg | INTRAMUSCULAR | Status: DC | PRN

## 2021-12-30 MED ORDER — OXYCODONE HCL 5 MG PO TABS: 10.0000 mg | ORAL_TABLET | ORAL | Status: DC | PRN

## 2021-12-30 MED ORDER — DEXAMETHASONE SODIUM PHOSPHATE 4 MG/ML IJ SOLN
4.0000 mg | Freq: Four times a day (QID) | INTRAMUSCULAR | Status: AC
  Administered 2021-12-30 (×2): 4 mg via INTRAVENOUS
  Filled 2021-12-30 (×2): qty 1

## 2021-12-30 MED ORDER — CEFAZOLIN SODIUM-DEXTROSE 2-4 GM/100ML-% IV SOLN
2.0000 g | Freq: Three times a day (TID) | INTRAVENOUS | Status: AC
  Administered 2021-12-30 – 2021-12-31 (×2): 2 g via INTRAVENOUS
  Filled 2021-12-30 (×2): qty 100

## 2021-12-30 MED ORDER — ACETAMINOPHEN 650 MG RE SUPP: 650.0000 mg | RECTAL | Status: DC | PRN

## 2021-12-30 MED ORDER — LACTATED RINGERS IV SOLN: INTRAVENOUS | Status: DC

## 2021-12-30 MED ORDER — PHENYLEPHRINE 80 MCG/ML (10ML) SYRINGE FOR IV PUSH (FOR BLOOD PRESSURE SUPPORT)
PREFILLED_SYRINGE | INTRAVENOUS | Status: AC
  Filled 2021-12-30: qty 20

## 2021-12-30 MED ORDER — DEXAMETHASONE SODIUM PHOSPHATE 10 MG/ML IJ SOLN
INTRAMUSCULAR | Status: AC
  Filled 2021-12-30: qty 1

## 2021-12-30 MED ORDER — GLYCOPYRROLATE 0.2 MG/ML IJ SOLN
INTRAMUSCULAR | Status: DC | PRN
  Administered 2021-12-30: .2 mg via INTRAVENOUS

## 2021-12-30 MED ORDER — ONDANSETRON HCL 4 MG/2ML IJ SOLN
INTRAMUSCULAR | Status: AC
  Filled 2021-12-30: qty 2

## 2021-12-30 MED ORDER — PHENOL 1.4 % MT LIQD: 1.0000 | OROMUCOSAL | Status: DC | PRN

## 2021-12-30 MED ORDER — FENTANYL CITRATE (PF) 250 MCG/5ML IJ SOLN
INTRAMUSCULAR | Status: AC
  Filled 2021-12-30: qty 5

## 2021-12-30 MED ORDER — DOCUSATE SODIUM 100 MG PO CAPS: 100.0000 mg | ORAL_CAPSULE | Freq: Two times a day (BID) | ORAL | Status: DC

## 2021-12-30 MED ORDER — ROCURONIUM BROMIDE 10 MG/ML (PF) SYRINGE
PREFILLED_SYRINGE | INTRAVENOUS | Status: AC
  Filled 2021-12-30: qty 10

## 2021-12-30 MED ORDER — LIDOCAINE 2% (20 MG/ML) 5 ML SYRINGE
INTRAMUSCULAR | Status: AC
  Filled 2021-12-30: qty 5

## 2021-12-30 MED ORDER — SUFENTANIL CITRATE 50 MCG/ML IV SOLN
INTRAVENOUS | Status: AC
  Filled 2021-12-30: qty 1

## 2021-12-30 MED ORDER — OXYCODONE HCL 5 MG PO TABS: 5.0000 mg | ORAL_TABLET | ORAL | Status: DC | PRN

## 2021-12-30 MED ORDER — SUGAMMADEX SODIUM 200 MG/2ML IV SOLN
INTRAVENOUS | Status: DC | PRN
  Administered 2021-12-30: 200 mg via INTRAVENOUS

## 2021-12-30 MED ORDER — HYDROMORPHONE HCL 1 MG/ML IJ SOLN
INTRAMUSCULAR | Status: AC
  Filled 2021-12-30: qty 1

## 2021-12-30 MED ORDER — MIDAZOLAM HCL 2 MG/2ML IJ SOLN
INTRAMUSCULAR | Status: DC | PRN
  Administered 2021-12-30: 2 mg via INTRAVENOUS

## 2021-12-30 MED ORDER — DEXAMETHASONE 4 MG PO TABS: 4.0000 mg | ORAL_TABLET | Freq: Four times a day (QID) | ORAL | Status: AC

## 2021-12-30 MED ORDER — ORAL CARE MOUTH RINSE: 15.0000 mL | Freq: Once | OROMUCOSAL | Status: AC

## 2021-12-30 MED ORDER — BISACODYL 10 MG RE SUPP: 10.0000 mg | Freq: Every day | RECTAL | Status: DC | PRN

## 2021-12-30 MED ORDER — BUPIVACAINE-EPINEPHRINE 0.5% -1:200000 IJ SOLN
INTRAMUSCULAR | Status: AC
  Filled 2021-12-30: qty 1

## 2021-12-30 MED ORDER — THROMBIN 5000 UNITS EX SOLR
OROMUCOSAL | Status: DC | PRN
  Administered 2021-12-30: 5 mL via TOPICAL

## 2021-12-30 MED ORDER — ONDANSETRON HCL 4 MG/2ML IJ SOLN
INTRAMUSCULAR | Status: DC | PRN
  Administered 2021-12-30: 4 mg via INTRAVENOUS

## 2021-12-30 MED ORDER — SUCCINYLCHOLINE CHLORIDE 200 MG/10ML IV SOSY
PREFILLED_SYRINGE | INTRAVENOUS | Status: AC
  Filled 2021-12-30: qty 10

## 2021-12-30 MED ORDER — ALUM & MAG HYDROXIDE-SIMETH 200-200-20 MG/5ML PO SUSP: 30.0000 mL | Freq: Four times a day (QID) | ORAL | Status: DC | PRN

## 2021-12-30 MED ORDER — CYCLOBENZAPRINE HCL 10 MG PO TABS
10.0000 mg | ORAL_TABLET | Freq: Three times a day (TID) | ORAL | Status: DC | PRN
  Administered 2022-01-01 – 2022-01-07 (×9): 10 mg via ORAL
  Filled 2021-12-30 (×9): qty 1

## 2021-12-30 MED ORDER — THROMBIN 5000 UNITS EX SOLR
CUTANEOUS | Status: AC
  Filled 2021-12-30: qty 5000

## 2021-12-30 MED ORDER — ZOLPIDEM TARTRATE 5 MG PO TABS: 5.0000 mg | ORAL_TABLET | Freq: Every evening | ORAL | Status: DC | PRN

## 2021-12-30 MED ORDER — LIDOCAINE 2% (20 MG/ML) 5 ML SYRINGE
INTRAMUSCULAR | Status: DC | PRN
  Administered 2021-12-30: 30 mg via INTRAVENOUS

## 2021-12-30 MED ORDER — GADOBUTROL 1 MMOL/ML IV SOLN
6.0000 mL | Freq: Once | INTRAVENOUS | Status: AC | PRN
  Administered 2021-12-30: 6 mL via INTRAVENOUS

## 2021-12-30 MED ORDER — CHLORHEXIDINE GLUCONATE 0.12 % MT SOLN: 15.0000 mL | Freq: Once | OROMUCOSAL | Status: AC

## 2021-12-30 MED ORDER — ONDANSETRON HCL 4 MG/2ML IJ SOLN: 4.0000 mg | Freq: Four times a day (QID) | INTRAMUSCULAR | Status: DC | PRN

## 2021-12-30 MED ORDER — ACETAMINOPHEN 500 MG PO TABS
1000.0000 mg | ORAL_TABLET | Freq: Four times a day (QID) | ORAL | Status: AC
  Administered 2021-12-30 – 2021-12-31 (×2): 1000 mg via ORAL
  Filled 2021-12-30 (×3): qty 2

## 2021-12-30 MED ORDER — PANTOPRAZOLE SODIUM 40 MG IV SOLR: 40.0000 mg | Freq: Every day | INTRAVENOUS | Status: DC

## 2021-12-30 MED ORDER — MORPHINE SULFATE (PF) 4 MG/ML IV SOLN: 4.0000 mg | INTRAVENOUS | Status: DC | PRN

## 2021-12-30 MED ORDER — HYDROMORPHONE HCL 1 MG/ML IJ SOLN
0.2500 mg | INTRAMUSCULAR | Status: DC | PRN
  Administered 2021-12-30: 0.5 mg via INTRAVENOUS

## 2021-12-30 MED ORDER — PHENYLEPHRINE HCL-NACL 20-0.9 MG/250ML-% IV SOLN
INTRAVENOUS | Status: DC | PRN
  Administered 2021-12-30: 75 ug/min via INTRAVENOUS

## 2021-12-30 MED ORDER — BACITRACIN ZINC 500 UNIT/GM EX OINT
TOPICAL_OINTMENT | CUTANEOUS | Status: DC | PRN
  Administered 2021-12-30: 1 via TOPICAL

## 2021-12-30 MED ORDER — PHENYLEPHRINE 80 MCG/ML (10ML) SYRINGE FOR IV PUSH (FOR BLOOD PRESSURE SUPPORT)
PREFILLED_SYRINGE | INTRAVENOUS | Status: DC | PRN
  Administered 2021-12-30: 200 ug via INTRAVENOUS

## 2021-12-30 MED ORDER — PROPOFOL 10 MG/ML IV BOLUS
INTRAVENOUS | Status: DC | PRN
  Administered 2021-12-30: 150 mg via INTRAVENOUS

## 2021-12-30 MED ORDER — 0.9 % SODIUM CHLORIDE (POUR BTL) OPTIME
TOPICAL | Status: DC | PRN
  Administered 2021-12-30: 1000 mL

## 2021-12-30 MED ORDER — ONDANSETRON HCL 4 MG PO TABS: 4.0000 mg | ORAL_TABLET | Freq: Four times a day (QID) | ORAL | Status: DC | PRN

## 2021-12-30 MED ORDER — CEFAZOLIN SODIUM-DEXTROSE 2-3 GM-%(50ML) IV SOLR
INTRAVENOUS | Status: DC | PRN
  Administered 2021-12-30: 2 g via INTRAVENOUS

## 2021-12-30 MED ORDER — CHLORHEXIDINE GLUCONATE 0.12 % MT SOLN
OROMUCOSAL | Status: AC
  Administered 2021-12-30: 15 mL via OROMUCOSAL
  Filled 2021-12-30: qty 15

## 2021-12-30 MED ORDER — ACETAMINOPHEN 325 MG PO TABS
650.0000 mg | ORAL_TABLET | ORAL | Status: DC | PRN
  Administered 2022-01-02 (×2): 650 mg via ORAL
  Filled 2021-12-30: qty 2

## 2021-12-30 MED ORDER — SUFENTANIL CITRATE 50 MCG/ML IV SOLN
INTRAVENOUS | Status: DC | PRN
  Administered 2021-12-30: 5 ug via INTRAVENOUS
  Administered 2021-12-30: 10 ug via INTRAVENOUS

## 2021-12-30 MED FILL — Thrombin For Soln 5000 Unit: CUTANEOUS | Qty: 5000 | Status: AC

## 2021-12-30 SURGICAL SUPPLY — 65 items
APL SKNCLS STERI-STRIP NONHPOA (GAUZE/BANDAGES/DRESSINGS)
BAG COUNTER SPONGE SURGICOUNT (BAG) ×2 IMPLANT
BAG SPNG CNTER NS LX DISP (BAG) ×1
BASKET BONE COLLECTION (BASKET) ×1 IMPLANT
BENZOIN TINCTURE PRP APPL 2/3 (GAUZE/BANDAGES/DRESSINGS) ×1 IMPLANT
BIT DRILL NEURO 2X3.1 SFT TUCH (MISCELLANEOUS) IMPLANT
BLADE CLIPPER SURG (BLADE) IMPLANT
BUR MATCHSTICK NEURO 3.0 LAGG (BURR) ×1 IMPLANT
BUR PRECISION FLUTE 6.0 (BURR) ×1 IMPLANT
CANISTER SUCT 3000ML PPV (MISCELLANEOUS) ×2 IMPLANT
CARTRIDGE OIL MAESTRO DRILL (MISCELLANEOUS) ×1 IMPLANT
CNTNR URN SCR LID CUP LEK RST (MISCELLANEOUS) ×1 IMPLANT
CONT SPEC 4OZ STRL OR WHT (MISCELLANEOUS) ×2
COVER BACK TABLE 60X90IN (DRAPES) ×2 IMPLANT
DIFFUSER DRILL AIR PNEUMATIC (MISCELLANEOUS) ×2 IMPLANT
DRAPE C-ARM 42X72 X-RAY (DRAPES) ×4 IMPLANT
DRAPE HALF SHEET 40X57 (DRAPES) ×2 IMPLANT
DRAPE LAPAROTOMY 100X72X124 (DRAPES) ×2 IMPLANT
DRAPE SURG 17X23 STRL (DRAPES) ×4 IMPLANT
DRILL NEURO 2X3.1 SOFT TOUCH (MISCELLANEOUS) ×2
DRSG OPSITE POSTOP 4X6 (GAUZE/BANDAGES/DRESSINGS) ×2 IMPLANT
DRSG OPSITE POSTOP 4X8 (GAUZE/BANDAGES/DRESSINGS) ×1 IMPLANT
ELECT BLADE 4.0 EZ CLEAN MEGAD (MISCELLANEOUS) ×2
ELECT REM PT RETURN 9FT ADLT (ELECTROSURGICAL) ×2
ELECTRODE BLDE 4.0 EZ CLN MEGD (MISCELLANEOUS) ×1 IMPLANT
ELECTRODE REM PT RTRN 9FT ADLT (ELECTROSURGICAL) ×1 IMPLANT
FEE INTRAOP MONITOR IMPULS NCS (MISCELLANEOUS) IMPLANT
GAUZE 4X4 16PLY ~~LOC~~+RFID DBL (SPONGE) ×1 IMPLANT
GLOVE BIO SURGEON STRL SZ8 (GLOVE) ×4 IMPLANT
GLOVE BIO SURGEON STRL SZ8.5 (GLOVE) ×4 IMPLANT
GLOVE EXAM NITRILE XL STR (GLOVE) ×2 IMPLANT
GOWN STRL REUS W/ TWL LRG LVL3 (GOWN DISPOSABLE) IMPLANT
GOWN STRL REUS W/ TWL XL LVL3 (GOWN DISPOSABLE) ×2 IMPLANT
GOWN STRL REUS W/TWL 2XL LVL3 (GOWN DISPOSABLE) IMPLANT
GOWN STRL REUS W/TWL LRG LVL3 (GOWN DISPOSABLE)
GOWN STRL REUS W/TWL XL LVL3 (GOWN DISPOSABLE) ×4
HEMOSTAT POWDER KIT SURGIFOAM (HEMOSTASIS) ×2 IMPLANT
INTRAOP MONITOR FEE IMPULS NCS (MISCELLANEOUS) ×1
INTRAOP MONITOR FEE IMPULSE (MISCELLANEOUS) ×2
KIT BASIN OR (CUSTOM PROCEDURE TRAY) ×2 IMPLANT
KIT GRAFTMAG DEL NEURO DISP (NEUROSURGERY SUPPLIES) IMPLANT
KIT TURNOVER KIT B (KITS) ×2 IMPLANT
MILL BONE PREP (MISCELLANEOUS) ×1 IMPLANT
NDL HYPO 21X1.5 SAFETY (NEEDLE) IMPLANT
NEEDLE HYPO 21X1.5 SAFETY (NEEDLE) IMPLANT
NEEDLE HYPO 22GX1.5 SAFETY (NEEDLE) ×2 IMPLANT
NS IRRIG 1000ML POUR BTL (IV SOLUTION) ×2 IMPLANT
OIL CARTRIDGE MAESTRO DRILL (MISCELLANEOUS)
PACK LAMINECTOMY NEURO (CUSTOM PROCEDURE TRAY) ×2 IMPLANT
PAD ARMBOARD 7.5X6 YLW CONV (MISCELLANEOUS) ×6 IMPLANT
PATTIES SURGICAL .5 X1 (DISPOSABLE) IMPLANT
SCREW PA CT VIRAGE 4.5X25 (Screw) ×2 IMPLANT
SPIKE FLUID TRANSFER (MISCELLANEOUS) ×1 IMPLANT
SPONGE NEURO XRAY DETECT 1X3 (DISPOSABLE) IMPLANT
SPONGE SURGIFOAM ABS GEL 100 (HEMOSTASIS) IMPLANT
SPONGE T-LAP 4X18 ~~LOC~~+RFID (SPONGE) IMPLANT
STRIP CLOSURE SKIN 1/2X4 (GAUZE/BANDAGES/DRESSINGS) ×2 IMPLANT
SUT VIC AB 1 CT1 18XBRD ANBCTR (SUTURE) ×2 IMPLANT
SUT VIC AB 1 CT1 8-18 (SUTURE) ×2
SUT VIC AB 2-0 CP2 18 (SUTURE) ×4 IMPLANT
SYR 20ML LL LF (SYRINGE) IMPLANT
TOWEL GREEN STERILE (TOWEL DISPOSABLE) ×2 IMPLANT
TOWEL GREEN STERILE FF (TOWEL DISPOSABLE) ×2 IMPLANT
TRAY FOLEY MTR SLVR 16FR STAT (SET/KITS/TRAYS/PACK) ×1 IMPLANT
WATER STERILE IRR 1000ML POUR (IV SOLUTION) ×2 IMPLANT

## 2021-12-30 NOTE — Progress Notes (Signed)
Occupational Therapy Treatment Patient Details Name: Rachel Duran MRN: [id number] DOB: [birth year] Today's Date: 12/30/2021   History of present illness 66 year old s/p 3 stage anterior/posterior fusion (C5-6 corpectomy, post fusion C3-7; ACDF C3-7) with post op weakness (L weaker than R). Postop MRI small amount of epidural fluid at C5-6. PMH: previous C5-6 and C6-7 anterior discectomy and interbody fusion in 2004; C3-4 and C4-5 anterior cervical discectomy fusion plating 2017;  fall on 10/29/2021 suffering a C5-6 fracture with persistent neck pain; R TKR; Bipolar; fibromyalgia, scleroderma; arthritis, depression, DDD, tuberculosis, L THA.   OT comments  Assisted pt with transfer to [facility] then to bed with at least mod A+2 helpful for line/equipment management. Increased difficulty transferring toward L due to difficulty controlling and advancing LLE. Pt given tubing to help with self-feeidng and squeeze ball/grip strengthening ex. Will continue to follow after surgery. Recommend CIR for rehab. PT with very supportive family.    Recommendations for follow up therapy are one component of a multi-disciplinary discharge planning process, led by the attending physician.  Recommendations may be updated based on patient status, additional functional criteria and insurance authorization.    Follow Up Recommendations  Acute inpatient rehab (3hours/day)    Assistance Recommended at Discharge Frequent or constant Supervision/Assistance  Patient can return home with the following  A lot of help with walking and/or transfers;A lot of help with bathing/dressing/bathroom;Assistance with cooking/housework;Assist for transportation;Help with stairs or ramp for entrance   Equipment Recommendations  BSC/3in1;Wheelchair (measurements OT);Wheelchair cushion (measurements OT)    Recommendations for Other Services Rehab consult;PT consult    Precautions / Restrictions Precautions Precautions:  Cervical;Fall;Other (comment) (neuropraxic; 2 drains) Precaution Booklet Issued: Yes (comment) Precaution Comments: cervical Required Braces or Orthoses: Cervical Brace Cervical Brace: Hard collar;Other (comment) (asking for clarification regarding wearing times)       Mobility Bed Mobility Overal bed mobility: Needs Assistance Bed Mobility: Rolling, Sit to Sidelying Rolling: Min guard      Sit to sidelying: Mod assist General bed mobility comments: to transition trunk upright    Transfers Overall transfer level: Needs assistance Equipment used: 1 person hand held assist Transfers: Sit to/from Stand, Bed to chair/wheelchair/BSC Sit to Stand: Mod assist     Step pivot transfers: Mod assist, +2 safety/equipment           Balance Overall balance assessment: Needs assistance   Sitting balance-Leahy Scale: Fair       Standing balance-Leahy Scale: Poor Standing balance comment: initial anterior bias                           ADL either performed or assessed with clinical judgement   ADL Overall ADL's : Needs assistance/impaired Eating/Feeding: Minimal assistance Eating/Feeding Details (indicate cue type and reason): giving built up tubing to help with self feeding Grooming: Moderate assistance   Upper Body Bathing: Moderate assistance   Lower Body Bathing: Maximal assistance;Sit to/from stand   Upper Body Dressing : Moderate assistance   Lower Body Dressing: Maximal assistance;Sit to/from stand   Toilet Transfer: Moderate assistance;+2 for safety/equipment;Stand-pivot;BSC/3in1 (increased assistance transferring toward L) Toilet Transfer Details (indicate cue type and reason): FAce to face transfer; RW not safe to use at this time; great difficulty controlling movement of LLE during stand step; knee buckling noted Toileting- Clothing Manipulation and Hygiene: Maximal assistance Toileting - Clothing Manipulation Details (indicate cue type and reason):  Able to wipe after urinating in sitting     Functional mobility  during ADLs: Moderate assistance;+2 for safety/equipment General ADL Comments: Began education on cervical precautions during ADL tasks    Extremity/Trunk Assessment Upper Extremity Assessment Upper Extremity Assessment: RUE deficits/detail;LUE deficits/detail RUE Deficits / Details: generalized weakness @ 4/5 proximally and 3+/5 distrally; poor in-hand manipulation skills although can oppose thum to all digits slowly; state she is using it but difficult to feed herself RUE Sensation: decreased light touch RUE Coordination: decreased fine motor LUE Deficits / Details: apparent neuropraxia; more impaired than R; AROM overall improved from last last, able to reach hand ot mouth but unable to touch top of head; stronger proximally @ 4/5; distally @ 3+/5; unable to complete composite wrist adn digit extension; unable to oppose thumb to digits LUE Sensation: decreased light touch;decreased proprioception LUE Coordination: decreased fine motor;decreased gross motor   Lower Extremity Assessment Lower Extremity Assessment: Defer to PT evaluation (ataxic)   Cervical / Trunk Assessment Cervical / Trunk Assessment: Neck Surgery    Vision Baseline Vision/History: 1 Wears glasses     Perception     Praxis      Cognition Arousal/Alertness: Awake/alert Behavior During Therapy: WFL for tasks assessed/performed Overall Cognitive Status: Within Functional Limits for tasks assessed                                          Exercises Exercises: Other exercises Other Exercises Other Exercises: encouraged B grip strengthening; controlled slow movement patterns; given squeeze ball    Shoulder Instructions       General Comments      Pertinent Vitals/ Pain       Pain Assessment Pain Assessment: 0-10 Pain Score: 7  Pain Location: neck Pain Descriptors / Indicators: Aching, Discomfort Pain Intervention(s): RN  gave pain meds during session, Limited activity within patient's tolerance, Repositioned  Home Living Family/patient expects to be discharged to:: Private residence Living Arrangements: Spouse/significant other Available Help at Discharge: Family;Available 24 hours/day Type of Home: House Home Access: Level entry     Home Layout: Two level;Able to live on main level with bedroom/bathroom     Bathroom Shower/Tub: Tub/shower unit;Walk-in shower;Curtain   Bathroom Toilet: Handicapped height Bathroom Accessibility: Yes How Accessible: Accessible via wheelchair;Accessible via walker Home Equipment: [place] (2 wheels);Cane - single point;Shower seat - built in;Toilet riser          Prior Functioning/Environment              Frequency  Min 3X/week        Progress Toward Goals  OT Goals(current goals can now be found in the care plan section)  Progress towards OT goals: Progressing toward goals  Acute Rehab OT Goals Patient Stated Goal: to get better OT Goal Formulation: With patient Time For Goal Achievement: 01/13/22 Potential to Achieve Goals: Good ADL Goals Pt Will Perform Upper Body Bathing: with set-up;with supervision;sitting Pt Will Perform Lower Body Bathing: with min guard assist;sitting/lateral leans;sit to/from stand Pt Will Perform Upper Body Dressing: with set-up;with supervision;sitting Pt Will Perform Lower Body Dressing: with min assist;sitting/lateral leans;sit to/from stand Pt Will Transfer to Toilet: stand pivot transfer;with min assist;bedside commode Pt Will Perform Toileting - Clothing Manipulation and hygiene: with supervision;with set-up;sitting/lateral leans Additional ADL Goal #1: Pt will independently verbalize 3 cervical precautions  Plan Discharge plan remains appropriate    Co-evaluation  AM-PAC OT "6 Clicks" Daily Activity     Outcome Measure   Help from another person eating meals?: A Little Help from  another person taking care of personal grooming?: A Lot Help from another person toileting, which includes using toliet, bedpan, or urinal?: A Lot Help from another person bathing (including washing, rinsing, drying)?: A Lot Help from another person to put on and taking off regular upper body clothing?: A Lot Help from another person to put on and taking off regular lower body clothing?: A Lot 6 Click Score: 13    End of Session Equipment Utilized During Treatment: Gait belt  OT Visit Diagnosis: Unsteadiness on feet (R26.81);Other abnormalities of gait and mobility (R26.89);Muscle weakness (generalized) (M62.81);History of falling (Z91.81);Ataxia, unspecified (R27.0);Apraxia (R48.2);Pain (neuropraxia) Pain - part of body:  (neck)   Activity Tolerance Patient tolerated treatment well   Patient Left in chair;with call bell/phone within reach;with family/visitor present   Nurse Communication Mobility status;Need for lift equipment Charlaine Dalton)        Time: 4132-4401 OT Time Calculation (min): 20 min  Charges: OT General Charges $OT Visit: 1 Visit OT Evaluation $OT Eval Moderate Complexity: 1 Mod OT Treatments $Self Care/Home Management : 8-22 mins  Maurie Boettcher, OT/L   Acute OT Clinical Specialist Acute Rehabilitation Services Pager [phone] Office [phone]   [facility] 12/30/2021, 1:57 PM

## 2021-12-30 NOTE — Progress Notes (Signed)
Subjective: The patient is alert and pleasant.  She is in no apparent distress.  Her husband is at the bedside.  She tells me she still has some weakness in her left hand but it is much better this morning.  Objective: Vital signs in last 24 hours: Temp:  [97.6 F (36.4 C)-98 F (36.7 C)] 97.8 F (36.6 C) (06/08 0250) Pulse Rate:  [61-113] 92 (06/08 0650) Resp:  [6-25] 13 (06/08 0650) BP: (109-158)/(57-97) 142/83 (06/08 0650) SpO2:  [93 %-99 %] 93 % (06/08 0650) Weight:  [59.9 kg] 59.9 kg (06/07 0918) Estimated body mass index is 23.38 kg/m as calculated from the following:   Height as of this encounter: '5\' 3"'$  (1.6 m).   Weight as of this encounter: 59.9 kg.   Intake/Output from previous day: 06/07 0701 - 06/08 0700 In: 4575.2 [P.O.:360; I.V.:3562.3; IV Piggyback:652.9] Out: 4645 [Urine:3775; Drains:120; Blood:750] Intake/Output this shift: No intake/output data recorded.  Physical exam the patient is alert and pleasant.  Her strength is normal except in her left tricep and left hand grip are approximately 4/5.  I reviewed the patient's postoperative MRI.  Her alignment looks good.  She has a small amount of epidural fluid at C5-6 but I do not see any significant neural compression.  The hardware looks good within the limitations of an MRI scan.  Lab Results: Recent Labs    12/29/21 1737 12/30/21 0359  WBC  --  14.8*  HGB 10.2* 9.8*  HCT 30.0* 28.3*  PLT  --  219   BMET Recent Labs    12/29/21 1737 12/30/21 0359  NA 137 131*  K 3.4* 3.8  CL  --  99  CO2  --  24  GLUCOSE  --  136*  BUN  --  <5*  CREATININE  --  0.39*  CALCIUM  --  8.7*    Studies/Results: MR CERVICAL SPINE W WO CONTRAST  Result Date: 12/30/2021 CLINICAL DATA:  Arm weakness, left and worsening EXAM: MRI CERVICAL SPINE WITHOUT AND WITH CONTRAST TECHNIQUE: Multiplanar and multiecho pulse sequences of the cervical spine, to include the craniocervical junction and cervicothoracic junction, were  obtained without and with intravenous contrast. CONTRAST:  46m GADAVIST GADOBUTROL 1 MMOL/ML IV SOLN COMPARISON:  10/29/2021 FINDINGS: Alignment: Improved alignment after reduction of displaced cervical spine fracture seen previously. No acute malalignment. Vertebrae: Interval posterior fusion spanning C3-T1. Pre-existing C3-C7 ACDF with solid arthrodesis but complicating fracture. No evidence of acute fracture. Cord: Normal cord signal.  No residual spinal canal hematoma seen. Posterior Fossa, vertebral arteries, paraspinal tissues: Posterior exposure fluid, expected. Redemonstrated C5-6 ligamentum flavum cleft. Mild prevertebral fluid is well. Postoperative fluid do not cause significant mass effect. Disc levels: There is mild posterior cord indentation due to ridging at C5-6. Diffusely patent foramina. IMPRESSION: 1. Interval reduction and fusion of cervical spine fracture seen last month. No unexpected postoperative features. Resolved epidural hematoma. 2. 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 Diffusely patent appearance of the foramina. Electronically Signed   By: JJorje GuildM.D.   On: 12/30/2021 06:15   DG Cervical Spine 2 or 3 views  Result Date: 12/29/2021 CLINICAL DATA:  Anterior cervical decompression EXAM: CERVICAL SPINE - 2-3 VIEW COMPARISON:  10/29/2021 FLUOROSCOPY TIME:  Radiation Exposure Index (as provided by the fluoroscopic device): 5.44 mGy If the device does not provide the exposure index: Fluoroscopy Time:  58 seconds Number of Acquired Images:  4 FINDINGS: Initial images again demonstrate anterior fixation from C3-C5 with significant  increased kyphosis at C5-6 similar to that noted on prior CT examination. This hardware was subsequently removed as well as C5-6 corpectomy with placement of interbody prosthesis at C6. Improvement of the previously seen kyphosis is noted. IMPRESSION: C5-6 corpectomy with placement interbody prosthesis. Electronically Signed   By: Inez Catalina M.D.   On: 12/29/2021 19:33   DG Cervical Spine 1 View  Result Date: 12/29/2021 CLINICAL DATA:  Anterior cervical decompression and fusion. EXAM: DG CERVICAL SPINE - 1 VIEW COMPARISON:  CT cervical spine 10/29/2021. FINDINGS: Posterior and anterior fusion hardware seen from the level of C3 extending inferiorly past the level of C5. Hardware is incompletely imaged. Patient is intubated. Enteric tube is present. Alignment is grossly anatomic. IMPRESSION: 1. Posterior and anterior fusion hardware is present from the level of C3 extending at least to the level of C5. Alignment is grossly anatomic. Electronically Signed   By: Ronney Asters M.D.   On: 12/29/2021 19:27   DG C-Arm 1-60 Min-No Report  Result Date: 12/29/2021 Fluoroscopy was utilized by the requesting physician.  No radiographic interpretation.   DG C-Arm 1-60 Min-No Report  Result Date: 12/29/2021 Fluoroscopy was utilized by the requesting physician.  No radiographic interpretation.   DG C-Arm 1-60 Min-No Report  Result Date: 12/29/2021 Fluoroscopy was utilized by the requesting physician.  No radiographic interpretation.   DG C-Arm 1-60 Min-No Report  Result Date: 12/29/2021 Fluoroscopy was utilized by the requesting physician.  No radiographic interpretation.   DG C-Arm 1-60 Min-No Report  Result Date: 12/29/2021 Fluoroscopy was utilized by the requesting physician.  No radiographic interpretation.   DG C-Arm 1-60 Min-No Report  Result Date: 12/29/2021 Fluoroscopy was utilized by the requesting physician.  No radiographic interpretation.   DG C-Arm 1-60 Min-No Report  Result Date: 12/29/2021 Fluoroscopy was utilized by the requesting physician.  No radiographic interpretation.    Assessment/Plan: Status post anterior and posterior cervical instrumentation fusion: The patient's postoperative MRI looks good.  She seems to have some weakness in the left C7 and C8 distribution.  Her hand is much stronger than when I saw her  last night after surgery.  I am going to check a CT scan to make sure the screws are well-positioned.  Otherwise she is doing very well.  I have discussed the situation with the patient and her husband.  Mild anemia, mild hyponatremia: Noted  LOS: 1 day     Rachel Duran 12/30/2021, 7:37 AM     Patient ID: Rachel Duran, female   DOB: Mar 23, 1956, 66 y.o.   MRN: [id number]

## 2021-12-30 NOTE — Anesthesia Procedure Notes (Signed)
Procedure Name: Intubation Date/Time: 12/30/2021 2:25 PM  Performed by: Eligha Bridegroom, CRNAPre-anesthesia Checklist: Patient identified, Emergency Drugs available, Suction available, Patient being monitored and Timeout performed Patient Re-evaluated:Patient Re-evaluated prior to induction Oxygen Delivery Method: Circle system utilized Preoxygenation: Pre-oxygenation with 100% oxygen Induction Type: IV induction Laryngoscope Size: Glidescope and 4 Tube type: Oral Tube size: 7.0 mm Number of attempts: 1 Airway Equipment and Method: Stylet and Video-laryngoscopy Placement Confirmation: ETT inserted through vocal cords under direct vision, positive ETCO2 and breath sounds checked- equal and bilateral Secured at: 21 cm Tube secured with: Tape Dental Injury: Teeth and Oropharynx as per pre-operative assessment

## 2021-12-30 NOTE — Anesthesia Postprocedure Evaluation (Signed)
Anesthesia Post Note  Patient: Rachel Duran  Procedure(s) Performed: REVISION OF POSTERIOR CERVICAL INSTRUMENTATION     Patient location during evaluation: PACU Anesthesia Type: General Level of consciousness: awake Pain management: pain level controlled Vital Signs Assessment: post-procedure vital signs reviewed and stable Cardiovascular status: stable Postop Assessment: no apparent nausea or vomiting Anesthetic complications: no   No notable events documented.  Last Vitals:  Vitals:   12/30/21 1715 12/30/21 1721  BP: (!) 149/88   Pulse: 85 85  Resp: (!) 21 (!) 22  Temp:  (!) 36.2 C  SpO2: 100% 97%    Last Pain:  Vitals:   12/30/21 1700  TempSrc:   PainSc: 6     LLE Motor Response: Purposeful movement (12/30/21 1721) LLE Sensation: Full sensation (12/30/21 1721) RLE Motor Response: Purposeful movement (12/30/21 1721) RLE Sensation: Full sensation (12/30/21 1721)      Joh Rao

## 2021-12-30 NOTE — Progress Notes (Signed)
Subjective: The patient is somnolent but arousable.  She is in no apparent distress.  Objective: Vital signs in last 24 hours: Temp:  [97.6 F (36.4 C)-98.3 F (36.8 C)] 97.9 F (36.6 C) (06/08 1338) Pulse Rate:  [61-113] 96 (06/08 1630) Resp:  [6-25] 14 (06/08 1630) BP: (109-158)/(57-97) 144/89 (06/08 1630) SpO2:  [93 %-100 %] 100 % (06/08 1630) Weight:  [59.9 kg] 59.9 kg (06/08 1338) Estimated body mass index is 23.39 kg/m as calculated from the following:   Height as of this encounter: '5\' 3"'$  (1.6 m).   Weight as of this encounter: 59.9 kg.   Intake/Output from previous day: 06/07 0701 - 06/08 0700 In: 4575.2 [P.O.:360; I.V.:3562.3; IV Piggyback:652.9] Out: 4645 [Urine:3775; Drains:120; Blood:750] Intake/Output this shift: Total I/O In: 1100 [I.V.:1100] Out: 10 [Urine:10]  Physical exam the patient is somnolent but arousable.  Her strength is normal except in her left hand grip which is approximately 3-4/5.  Lab Results: Recent Labs    12/29/21 1737 12/30/21 0359  WBC  --  14.8*  HGB 10.2* 9.8*  HCT 30.0* 28.3*  PLT  --  219   BMET Recent Labs    12/29/21 1737 12/30/21 0359  NA 137 131*  K 3.4* 3.8  CL  --  99  CO2  --  24  GLUCOSE  --  136*  BUN  --  <5*  CREATININE  --  0.39*  CALCIUM  --  8.7*    Studies/Results: DG Cervical Spine 2 or 3 views  Result Date: 12/30/2021 CLINICAL DATA:  Cervical revision EXAM: CERVICAL SPINE - 2-3 VIEW COMPARISON:  CT 10/29/2021 FINDINGS: Anterior fusion from C7 through T1 with corpectomy of C6. Posterior fusion from C3 to the lower cervical/upper thoracic region. Detail is limited on the lateral view. IMPRESSION: Anterior and fusion procedures. Lower detail is limited in the lateral projection. Electronically Signed   By: Nelson Chimes M.D.   On: 12/30/2021 16:00   DG C-Arm 1-60 Min-No Report  Result Date: 12/30/2021 Fluoroscopy was utilized by the requesting physician.  No radiographic interpretation.   CT CERVICAL  SPINE WO CONTRAST  Result Date: 12/30/2021 CLINICAL DATA:  Cervical radiculopathy after surgery EXAM: CT CERVICAL SPINE WITHOUT CONTRAST TECHNIQUE: Multidetector CT imaging of the cervical spine was performed without intravenous contrast. Multiplanar CT image reconstructions were also generated. RADIATION DOSE REDUCTION: This exam was performed according to the departmental dose-optimization program which includes automated exposure control, adjustment of the mA and/or kV according to patient size and/or use of iterative reconstruction technique. COMPARISON:  10/29/2021 and preceding cervical MRI. FINDINGS: Alignment: Improved alignment after reduction of 3 column fractures seen in April. New baseline Skull base and vertebrae: Posterior-lateral fusion hardware spanning C3-T1. The hardware is intact. Revision of ACDF hardware which spans C4 to C7-T1 with corpectomy cage at C6. Preexistent solid arthrodesis at C3-4, C4-5, and C6-7. There has been decompression of the fractured bilateral posterior elements at C6-7. no hardware fracture or transcanal screw placement. The left T1 pedicle screw partially traverses the upper T1-2 foramen. The left C5 lateral mass screw is in close proximity to the transverse foramen, but lateral to it and there is preserved flow void by MRI. Soft tissues and spinal canal: Expected soft tissue gas and swelling in the posterior soft tissues. No evidence of canal hematoma by prior MRI. Disc levels:  No bony canal or foraminal impingement. Upper chest: Clear apical lungs. IMPRESSION: Cervical spine fracture reduction and fixation with corpectomy at the C5-6 level. No  hardware fracture or displacement. The left T1 pedicle screw partially traverses the left T1-2 foramen; no cervical foraminal encroachment to explain cervical radiculopathy. Electronically Signed   By: Jorje Guild M.D.   On: 12/30/2021 09:55   MR CERVICAL SPINE W WO CONTRAST  Result Date: 12/30/2021 CLINICAL DATA:  Arm  weakness, left and worsening EXAM: MRI CERVICAL SPINE WITHOUT AND WITH CONTRAST TECHNIQUE: Multiplanar and multiecho pulse sequences of the cervical spine, to include the craniocervical junction and cervicothoracic junction, were obtained without and with intravenous contrast. CONTRAST:  24m GADAVIST GADOBUTROL 1 MMOL/ML IV SOLN COMPARISON:  10/29/2021 FINDINGS: Alignment: Improved alignment after reduction of displaced cervical spine fracture seen previously. No acute malalignment. Vertebrae: Interval posterior fusion spanning C3-T1. Pre-existing C3-C7 ACDF with solid arthrodesis but complicating fracture. No evidence of acute fracture. Cord: Normal cord signal.  No residual spinal canal hematoma seen. Posterior Fossa, vertebral arteries, paraspinal tissues: Posterior exposure fluid, expected. Redemonstrated C5-6 ligamentum flavum cleft. Mild prevertebral fluid is well. Postoperative fluid do not cause significant mass effect. Disc levels: There is mild posterior cord indentation due to ridging at C5-6. Diffusely patent foramina. IMPRESSION: 1. Interval reduction and fusion of cervical spine fracture seen last month. No unexpected postoperative features. Resolved epidural hematoma. 2. 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 Diffusely patent appearance of the foramina. Electronically Signed   By: JJorje GuildM.D.   On: 12/30/2021 06:15   DG Cervical Spine 2 or 3 views  Result Date: 12/29/2021 CLINICAL DATA:  Anterior cervical decompression EXAM: CERVICAL SPINE - 2-3 VIEW COMPARISON:  10/29/2021 FLUOROSCOPY TIME:  Radiation Exposure Index (as provided by the fluoroscopic device): 5.44 mGy If the device does not provide the exposure index: Fluoroscopy Time:  58 seconds Number of Acquired Images:  4 FINDINGS: Initial images again demonstrate anterior fixation from C3-C5 with significant increased kyphosis at C5-6 similar to that noted on prior CT examination. This hardware was subsequently  removed as well as C5-6 corpectomy with placement of interbody prosthesis at C6. Improvement of the previously seen kyphosis is noted. IMPRESSION: C5-6 corpectomy with placement interbody prosthesis. Electronically Signed   By: MInez CatalinaM.D.   On: 12/29/2021 19:33   DG Cervical Spine 1 View  Result Date: 12/29/2021 CLINICAL DATA:  Anterior cervical decompression and fusion. EXAM: DG CERVICAL SPINE - 1 VIEW COMPARISON:  CT cervical spine 10/29/2021. FINDINGS: Posterior and anterior fusion hardware seen from the level of C3 extending inferiorly past the level of C5. Hardware is incompletely imaged. Patient is intubated. Enteric tube is present. Alignment is grossly anatomic. IMPRESSION: 1. Posterior and anterior fusion hardware is present from the level of C3 extending at least to the level of C5. Alignment is grossly anatomic. Electronically Signed   By: ARonney AstersM.D.   On: 12/29/2021 19:27   DG C-Arm 1-60 Min-No Report  Result Date: 12/29/2021 Fluoroscopy was utilized by the requesting physician.  No radiographic interpretation.   DG C-Arm 1-60 Min-No Report  Result Date: 12/29/2021 Fluoroscopy was utilized by the requesting physician.  No radiographic interpretation.   DG C-Arm 1-60 Min-No Report  Result Date: 12/29/2021 Fluoroscopy was utilized by the requesting physician.  No radiographic interpretation.   DG C-Arm 1-60 Min-No Report  Result Date: 12/29/2021 Fluoroscopy was utilized by the requesting physician.  No radiographic interpretation.   DG C-Arm 1-60 Min-No Report  Result Date: 12/29/2021 Fluoroscopy was utilized by the requesting physician.  No radiographic interpretation.   DG C-Arm 1-60 Min-No Report  Result Date: 12/29/2021 Fluoroscopy was utilized by the requesting physician.  No radiographic interpretation.   DG C-Arm 1-60 Min-No Report  Result Date: 12/29/2021 Fluoroscopy was utilized by the requesting physician.  No radiographic interpretation.     Assessment/Plan: Status post revision revision of posterior hardware.  Her left hand is weak but I think this will take some time to resolve.  LOS: 1 day     Rachel Duran 12/30/2021, 4:41 PM

## 2021-12-30 NOTE — Transfer of Care (Signed)
Immediate Anesthesia Transfer of Care Note  Patient: Rachel Duran  Procedure(s) Performed: REVISION OF POSTERIOR CERVICAL INSTRUMENTATION  Patient Location: PACU 2 Anesthesia Type:General  Level of Consciousness: awake and oriented  Airway & Oxygen Therapy: Patient Spontanous Breathing  Post-op Assessment: Report given to RN and Post -op Vital signs reviewed and stable  Post vital signs: Reviewed and stable  Last Vitals:  Vitals Value Taken Time  BP 144/89 12/30/21 1630  Temp    Pulse 88 12/30/21 1634  Resp 18 12/30/21 1635  SpO2 100 % 12/30/21 1634  Vitals shown include unvalidated device data.  Last Pain:  Vitals:   12/30/21 1338  TempSrc: Oral  PainSc: 5       Patients Stated Pain Goal: 4 (01/48/40 3979)  Complications: No notable events documented.

## 2021-12-30 NOTE — Progress Notes (Signed)
PT Cancellation Note  Patient Details Name: Rachel Duran MRN: [id number] DOB: May 11, 1956   Cancelled Treatment:    Reason Eval/Treat Not Completed: Medical issues which prohibited therapy; noted plans to return to OR.  Will attempt another day.   Reginia Naas 12/30/2021, 1:12 PM Magda Kiel, PT Acute Rehabilitation Services [phone] Office:[phone] 12/30/2021

## 2021-12-30 NOTE — TOC Progression Note (Signed)
Transition of Care [facility]) - Progression Note    Patient Details  Name: Rachel Duran MRN: [id number] Date of Birth: 1956-03-07  Transition of Care [facility]) CM/SW [place], RN Phone Number:[phone]  12/30/2021, 9:37 AM  Clinical Narrative:    [facility] acknowledges general consult for  PT / OT / SLP / DME as needed. Currently there are no needs noted. TOC will continue to follow.         Expected Discharge Plan and Services                                                 Social Determinants of Health (SDOH) Interventions    Readmission Risk Interventions     No data to display

## 2021-12-30 NOTE — Anesthesia Preprocedure Evaluation (Addendum)
Anesthesia Evaluation  Patient identified by MRN, date of birth, ID band Patient awake    History of Anesthesia Complications (+) PONV and history of anesthetic complications  Airway Mallampati: II  TM Distance: >3 FB     Dental   Pulmonary    breath sounds clear to auscultation       Cardiovascular negative cardio ROS   Rhythm:Regular Rate:Normal     Neuro/Psych    GI/Hepatic Neg liver ROS, GERD  ,  Endo/Other    Renal/GU negative Renal ROS     Musculoskeletal  (+) Arthritis , Fibromyalgia -  Abdominal   Peds  Hematology   Anesthesia Other Findings   Reproductive/Obstetrics                             Anesthesia Physical Anesthesia Plan  ASA: 3  Anesthesia Plan: General   Post-op Pain Management:    Induction: Intravenous  PONV Risk Score and Plan: 4 or greater and Ondansetron and Dexamethasone  Airway Management Planned: Oral ETT  Additional Equipment:   Intra-op Plan:   Post-operative Plan: Extubation in OR  Informed Consent: I have reviewed the patients History and Physical, chart, labs and discussed the procedure including the risks, benefits and alternatives for the proposed anesthesia with the patient or authorized representative who has indicated his/her understanding and acceptance.     Dental advisory given  Plan Discussed with: CRNA and Anesthesiologist  Anesthesia Plan Comments:         Anesthesia Quick Evaluation

## 2021-12-30 NOTE — Progress Notes (Signed)
Patient ID: Subjective: The patient is alert and pleasant.  She looks well.  Her husband and sister at the bedside.  Objective: Vital signs in last 24 hours: Temp:  [97.6 F (36.4 C)-98.1 F (36.7 C)] 97.8 F (36.6 C) (06/08 1048) Pulse Rate:  [61-113] 86 (06/08 1048) Resp:  [6-25] 19 (06/08 1048) BP: (109-158)/(57-97) 139/92 (06/08 1048) SpO2:  [93 %-99 %] 97 % (06/08 1048) Estimated body mass index is 23.38 kg/m as calculated from the following:   Height as of this encounter: '5\' 3"'$  (1.6 m).   Weight as of this encounter: 59.9 kg.   Intake/Output from previous day: 06/07 0701 - 06/08 0700 In: 4575.2 [P.O.:360; I.V.:3562.3; IV Piggyback:652.9] Out: 4645 [Urine:3775; Drains:120; Blood:750] Intake/Output this shift: No intake/output data recorded.  Physical exam the patient is alert and pleasant.  She has slight numbness in her right hand but normal strength.  She continues to have weakness to her left hand at 4/5.  I reviewed the patient's cervical CT.  The left T1 pedicle screw is a bit caudally placed and enters the neural foramen.  Lab Results: Recent Labs    12/29/21 1737 12/30/21 0359  WBC  --  14.8*  HGB 10.2* 9.8*  HCT 30.0* 28.3*  PLT  --  219   BMET Recent Labs    12/29/21 1737 12/30/21 0359  NA 137 131*  K 3.4* 3.8  CL  --  99  CO2  --  24  GLUCOSE  --  136*  BUN  --  <5*  CREATININE  --  0.39*  CALCIUM  --  8.7*    Studies/Results: CT CERVICAL SPINE WO CONTRAST  Result Date: 12/30/2021 CLINICAL DATA:  Cervical radiculopathy after surgery EXAM: CT CERVICAL SPINE WITHOUT CONTRAST TECHNIQUE: Multidetector CT imaging of the cervical spine was performed without intravenous contrast. Multiplanar CT image reconstructions were also generated. RADIATION DOSE REDUCTION: This exam was performed according to the departmental dose-optimization program which includes automated exposure control, adjustment of the mA and/or kV according to patient size and/or use of  iterative reconstruction technique. COMPARISON:  10/29/2021 and preceding cervical MRI. FINDINGS: Alignment: Improved alignment after reduction of 3 column fractures seen in April. New baseline Skull base and vertebrae: Posterior-lateral fusion hardware spanning C3-T1. The hardware is intact. Revision of ACDF hardware which spans C4 to C7-T1 with corpectomy cage at C6. Preexistent solid arthrodesis at C3-4, C4-5, and C6-7. There has been decompression of the fractured bilateral posterior elements at C6-7. no hardware fracture or transcanal screw placement. The left T1 pedicle screw partially traverses the upper T1-2 foramen. The left C5 lateral mass screw is in close proximity to the transverse foramen, but lateral to it and there is preserved flow void by MRI. Soft tissues and spinal canal: Expected soft tissue gas and swelling in the posterior soft tissues. No evidence of canal hematoma by prior MRI. Disc levels:  No bony canal or foraminal impingement. Upper chest: Clear apical lungs. IMPRESSION: Cervical spine fracture reduction and fixation with corpectomy at the C5-6 level. No hardware fracture or displacement. The left T1 pedicle screw partially traverses the left T1-2 foramen; no cervical foraminal encroachment to explain cervical radiculopathy. Electronically Signed   By: Jorje Guild M.D.   On: 12/30/2021 09:55   MR CERVICAL SPINE W WO CONTRAST  Result Date: 12/30/2021 CLINICAL DATA:  Arm weakness, left and worsening EXAM: MRI CERVICAL SPINE WITHOUT AND WITH CONTRAST TECHNIQUE: Multiplanar and multiecho pulse sequences of the cervical spine, to include the  craniocervical junction and cervicothoracic junction, were obtained without and with intravenous contrast. CONTRAST:  47m GADAVIST GADOBUTROL 1 MMOL/ML IV SOLN COMPARISON:  10/29/2021 FINDINGS: Alignment: Improved alignment after reduction of displaced cervical spine fracture seen previously. No acute malalignment. Vertebrae: Interval posterior  fusion spanning C3-T1. Pre-existing C3-C7 ACDF with solid arthrodesis but complicating fracture. No evidence of acute fracture. Cord: Normal cord signal.  No residual spinal canal hematoma seen. Posterior Fossa, vertebral arteries, paraspinal tissues: Posterior exposure fluid, expected. Redemonstrated C5-6 ligamentum flavum cleft. Mild prevertebral fluid is well. Postoperative fluid do not cause significant mass effect. Disc levels: There is mild posterior cord indentation due to ridging at C5-6. Diffusely patent foramina. IMPRESSION: 1. Interval reduction and fusion of cervical spine fracture seen last month. No unexpected postoperative features. Resolved epidural hematoma. 2. 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 Diffusely patent appearance of the foramina. Electronically Signed   By: JJorje GuildM.D.   On: 12/30/2021 06:15   DG Cervical Spine 2 or 3 views  Result Date: 12/29/2021 CLINICAL DATA:  Anterior cervical decompression EXAM: CERVICAL SPINE - 2-3 VIEW COMPARISON:  10/29/2021 FLUOROSCOPY TIME:  Radiation Exposure Index (as provided by the fluoroscopic device): 5.44 mGy If the device does not provide the exposure index: Fluoroscopy Time:  58 seconds Number of Acquired Images:  4 FINDINGS: Initial images again demonstrate anterior fixation from C3-C5 with significant increased kyphosis at C5-6 similar to that noted on prior CT examination. This hardware was subsequently removed as well as C5-6 corpectomy with placement of interbody prosthesis at C6. Improvement of the previously seen kyphosis is noted. IMPRESSION: C5-6 corpectomy with placement interbody prosthesis. Electronically Signed   By: MInez CatalinaM.D.   On: 12/29/2021 19:33   DG Cervical Spine 1 View  Result Date: 12/29/2021 CLINICAL DATA:  Anterior cervical decompression and fusion. EXAM: DG CERVICAL SPINE - 1 VIEW COMPARISON:  CT cervical spine 10/29/2021. FINDINGS: Posterior and anterior fusion hardware seen from the  level of C3 extending inferiorly past the level of C5. Hardware is incompletely imaged. Patient is intubated. Enteric tube is present. Alignment is grossly anatomic. IMPRESSION: 1. Posterior and anterior fusion hardware is present from the level of C3 extending at least to the level of C5. Alignment is grossly anatomic. Electronically Signed   By: ARonney AstersM.D.   On: 12/29/2021 19:27   DG C-Arm 1-60 Min-No Report  Result Date: 12/29/2021 Fluoroscopy was utilized by the requesting physician.  No radiographic interpretation.   DG C-Arm 1-60 Min-No Report  Result Date: 12/29/2021 Fluoroscopy was utilized by the requesting physician.  No radiographic interpretation.   DG C-Arm 1-60 Min-No Report  Result Date: 12/29/2021 Fluoroscopy was utilized by the requesting physician.  No radiographic interpretation.   DG C-Arm 1-60 Min-No Report  Result Date: 12/29/2021 Fluoroscopy was utilized by the requesting physician.  No radiographic interpretation.   DG C-Arm 1-60 Min-No Report  Result Date: 12/29/2021 Fluoroscopy was utilized by the requesting physician.  No radiographic interpretation.   DG C-Arm 1-60 Min-No Report  Result Date: 12/29/2021 Fluoroscopy was utilized by the requesting physician.  No radiographic interpretation.   DG C-Arm 1-60 Min-No Report  Result Date: 12/29/2021 Fluoroscopy was utilized by the requesting physician.  No radiographic interpretation.    Assessment/Plan: Left hand weakness, misplaced left T1 pedicle screw: I have discussed the situation with the patient and her husband.  We discussed the various treatment options.  I have recommended a revision of her posterior instrumentation, i.e. revising  the T1 pedicle screws.  I described the procedure, the risk, benefits, alternatives, etc.  I have answered all her questions.  She wants to proceed with surgery.  We will do this this afternoon when an OR becomes available.  LOS: 1 day     Rachel Duran 12/30/2021,  11:56 AM     Rachel Duran, female   DOB: Dec 20, 1955, 66 y.o.   MRN: [id number]

## 2021-12-30 NOTE — Progress Notes (Addendum)
Primary nurse conducted neurological reassessment at midnight and noted changes. Patient with left arm weakness that had worsened from prior exam- patient could no longer grip/move fingers and had gross movement of extremity only. Right arm was also noted to be weaker than prior exam but fine motor movement intact. Pt also c/o of new decreased sensation in left arm when compared to right arm and also c/o tingling in left hand and fingers as well as tingling in right arm from elbow to tips of fingers.  Provider K. Meyran NP paged. New order for MRI obtained.

## 2021-12-30 NOTE — Evaluation (Signed)
Occupational Therapy Evaluation Patient Details Name: Rachel Duran MRN: [id number] DOB: 1955/08/03 Today's Date: 12/30/2021   History of Present Illness 66 year old s/p 3 stage anterior/posterior fusion (C5-6 corpectomy, post fusion C3-7; ACDF C3-7) with post op weakness (L weaker than R). Postop MRI small amount of epidural fluid at C5-6. PMH: previous C5-6 and C6-7 anterior discectomy and interbody fusion in 2004; C3-4 and C4-5 anterior cervical discectomy fusion plating 2017;  fall on 10/29/2021 suffering a C5-6 fracture with persistent neck pain; R TKR; Bipolar; fibromyalgia, scleroderma; arthritis, depression, DDD, tuberculosis, L THA.   Clinical Impression   PTA pt lives independently with her husband and enjoys spending time with her grandchildren and gardening. PT presents with significant decline in functional status, requiring Mod A +2 for stand step transfer using face to face transfer technique and Max A with ADL tasks due to deficits listed below. Recommend intensive rehab at [facility] to maximize independence and facilitate safe DC home. Acute OT to follow.  Left message regarding clarification orders cervical collar/wearing time. Pt may benefit from Lidocaine patch between scapula for muscle discomfort - used this PTA.     Recommendations for follow up therapy are one component of a multi-disciplinary discharge planning process, led by the attending physician.  Recommendations may be updated based on patient status, additional functional criteria and insurance authorization.   Follow Up Recommendations  Acute inpatient rehab (3hours/day)    Assistance Recommended at Discharge Frequent or constant Supervision/Assistance  Patient can return home with the following A lot of help with walking and/or transfers;A lot of help with bathing/dressing/bathroom;Assistance with cooking/housework;Assist for transportation;Help with stairs or ramp for entrance    Functional Status Assessment   Patient has had a recent decline in their functional status and demonstrates the ability to make significant improvements in function in a reasonable and predictable amount of time.  Equipment Recommendations  BSC/3in1;Wheelchair (measurements OT);Wheelchair cushion (measurements OT)    Recommendations for Other Services Rehab consult;PT consult     Precautions / Restrictions Precautions Precautions: Cervical;Fall;Other (comment) (neuropraxic; 2 drains) Precaution Booklet Issued: Yes (comment) Precaution Comments: cervical Required Braces or Orthoses: Cervical Brace Cervical Brace: Hard collar;Other (comment) (asking for clarification regarding wearing times)      Mobility Bed Mobility Overal bed mobility: Needs Assistance Bed Mobility: Rolling, Sidelying to Sit Rolling: Min guard Sidelying to sit: Min assist       General bed mobility comments: to transition trunk upright    Transfers Overall transfer level: Needs assistance Equipment used: 1 person hand held assist Transfers: Sit to/from Stand, Bed to chair/wheelchair/BSC Sit to Stand: Mod assist     Step pivot transfers: Mod assist, +2 safety/equipment            Balance Overall balance assessment: Needs assistance   Sitting balance-Leahy Scale: Fair       Standing balance-Leahy Scale: Poor Standing balance comment: initial anterioro bias                           ADL either performed or assessed with clinical judgement   ADL Overall ADL's : Needs assistance/impaired Eating/Feeding: Minimal assistance   Grooming: Moderate assistance   Upper Body Bathing: Moderate assistance   Lower Body Bathing: Maximal assistance;Sit to/from stand   Upper Body Dressing : Moderate assistance   Lower Body Dressing: Maximal assistance;Sit to/from stand   Toilet Transfer: Moderate assistance;+2 for safety/equipment;Stand-pivot;BSC/3in1 Toilet Transfer Details (indicate cue type and reason): FAce to face  transfer;  RW not safe to use at this time Toileting- Water quality scientist and Hygiene: Maximal assistance Toileting - Clothing Manipulation Details (indicate cue type and reason): Able to wipe after urinating in sitting     Functional mobility during ADLs: Moderate assistance;+2 for safety/equipment General ADL Comments: Began education on cervical precautions during ADL tasks     Vision Baseline Vision/History: 1 Wears glasses       Perception     Praxis      Pertinent Vitals/Pain Pain Assessment Pain Assessment: 0-10 Pain Score: 5  Pain Location: neck Pain Descriptors / Indicators: Aching, Discomfort Pain Intervention(s): Limited activity within patient's tolerance, Repositioned     Hand Dominance Right   Extremity/Trunk Assessment Upper Extremity Assessment Upper Extremity Assessment: RUE deficits/detail;LUE deficits/detail RUE Deficits / Details: generalized weakness @ 4/5 proximally and 3+/5 distrally; poor in-hand manipulation skills although can oppose thum to all digits slowly; state she is using it but difficult to feed herself RUE Sensation: decreased light touch RUE Coordination: decreased fine motor LUE Deficits / Details: apparent neuropraxia; more impaired than R; AROM overall improved from last last, able to reach hand ot mouth but unable to touch top of head; stronger proximally @ 4/5; distally @ 3+/5; unable to complete composite wrist adn digit extension; unable to oppose thumb to digits LUE Sensation: decreased light touch;decreased proprioception LUE Coordination: decreased fine motor;decreased gross motor   Lower Extremity Assessment Lower Extremity Assessment: Defer to PT evaluation (LLE more imparied than R; poor proprioception; knee buckling noted)   Cervical / Trunk Assessment Cervical / Trunk Assessment: Neck Surgery   Communication Communication Communication: No difficulties   Cognition Arousal/Alertness: Awake/alert Behavior During  Therapy: WFL for tasks assessed/performed Overall Cognitive Status: Within Functional Limits for tasks assessed                                       General Comments       Exercises Exercises: Other exercises Other Exercises Other Exercises: encouraged B grip strengthening; controlled slow movement patterns   Shoulder Instructions      Home Living Family/patient expects to be discharged to:: Private residence Living Arrangements: Spouse/significant other Available Help at Discharge: Family;Available 24 hours/day Type of Home: House Home Access: Level entry     Home Layout: Two level;Able to live on main level with bedroom/bathroom     Bathroom Shower/Tub: Tub/shower unit;Walk-in shower;Curtain   Bathroom Toilet: Handicapped height Bathroom Accessibility: Yes How Accessible: Accessible via wheelchair;Accessible via walker Home Equipment: [place] (2 wheels);Cane - single point;Shower seat - built in;Toilet riser          Prior Functioning/Environment Prior Level of Function : Independent/Modified Independent;Driving;Other (comment) (enjoys spending time with her granfdchilren and gardening (flowers))                        OT Problem List: Decreased strength;Decreased range of motion;Decreased activity tolerance;Impaired balance (sitting and/or standing);Decreased coordination;Decreased safety awareness;Decreased knowledge of use of DME or AE;Decreased knowledge of precautions;Impaired sensation;Impaired tone;Impaired UE functional use;Pain      OT Treatment/Interventions: Self-care/ADL training;Therapeutic exercise;Neuromuscular education;DME and/or AE instruction;Therapeutic activities;Patient/family education;Balance training    OT Goals(Current goals can be found in the care plan section) Acute Rehab OT Goals Patient Stated Goal: to get better adn "back to normal" OT Goal Formulation: With patient Time For Goal Achievement:  01/13/22 Potential to Achieve Goals: Good  OT  Frequency: Min 3X/week    Co-evaluation              AM-PAC OT "6 Clicks" Daily Activity     Outcome Measure Help from another person eating meals?: A Little Help from another person taking care of personal grooming?: A Lot Help from another person toileting, which includes using toliet, bedpan, or urinal?: A Lot Help from another person bathing (including washing, rinsing, drying)?: A Lot Help from another person to put on and taking off regular upper body clothing?: A Lot Help from another person to put on and taking off regular lower body clothing?: A Lot 6 Click Score: 13   End of Session Equipment Utilized During Treatment: Gait belt Nurse Communication: Mobility status;Need for lift equipment Charlaine Dalton)  Activity Tolerance: Patient tolerated treatment well Patient left: in chair;with call bell/phone within reach;with family/visitor present  OT Visit Diagnosis: Unsteadiness on feet (R26.81);Other abnormalities of gait and mobility (R26.89);Muscle weakness (generalized) (M62.81);History of falling (Z91.81);Ataxia, unspecified (R27.0);Apraxia (R48.2);Pain (neuropraxia) Pain - part of body:  (neck)                Time: 4403-4742 OT Time Calculation (min): 43 min Charges:  OT General Charges $OT Visit: 1 Visit OT Evaluation $OT Eval Moderate Complexity: 1 Mod OT Treatments $Self Care/Home Management : 23-37 mins  Maurie Boettcher, OT/L   Acute OT Clinical Specialist Acute Rehabilitation Services Pager [phone] Office [phone]   [facility] 12/30/2021, 11:16 AM

## 2021-12-30 NOTE — Op Note (Signed)
Brief history: The patient is a 66 year old white female who has had a previous C3-5 fusion and a remote C5-C7 fusion.  She fell suffering a C5-6 fracture subluxation.  I discussed the various treatment options.  She decided proceed with surgery.  I performed surgery yesterday.  She has had some left hand weakness consistent with a C8 radiculopathy.  A cervical MRI looked good.  A cervical CT demonstrated that the left T1 pedicle screw was a bit low and into the neural foramen.  I discussed the various treatment options and recommended surgery.  The patient has decided to proceed with a revision of her posterior cervical instrumentation.  Preop diagnosis: Misplacement of thoracic pedicle screws  Postop diagnosis: The same  Procedure: Revision of posterior cervical instrumentation  Surgeon: Dr. Earle Gell  Assistant: None  Anesthesia: General tracheal  Estimated loss: Minimal  Specimens: None  Drains: 1 medium Hemovac drain  Complications: None  Description of procedure: The patient was brought to the operating room by the anesthesia team.  General endotracheal anesthesia was induced.  I then applied the [place] three-point headrest to the patient calvarium.  She was then turned to the prone position on the [place] table.  Her head was supported in the [place] three-point headrest.  We confirmed the neutral position using fluoroscopy.  The patient's posterior cervical dressing, drain, and Steri-Strips were then removed.  Her posterior cervical region was then prepared with Betadine scrub and Betadine solution.  Sterile drapes were applied.  I then used the Metzenbaum scissors to ligate the patient's previous sutures.  I dissected down to the spine and provide exposure with the cerebellar retractors.  I then remove the caps from the old screws and remove the rods.  I then remove the bilateral T1 pedicle screws.  I used intraoperative fluoroscopy for placement of the screws but it was of  limited value.  I then drilled a pilot hole cephalad to the previous T1 pedicle screws and drilled a 25 mm hole.  We probed inside the hole and ruled out cortical breaches.  I then inserted a 4.5 x 25 mm pedicle screw into the bilateral T1 pedicles.  We have good bony purchase.  I then replaced the rods and caps we tightened the caps appropriately.  This completed instrumentation.  We obtained hemostasis using bipolar cautery.  I placed a medium Hemovac drain in the epidural space and tunneled out through a separate stab wound.  We then remove the retractors.  We reapproximated patient's cervical thoracic fascia with interrupted 0 Vicryl suture.  We reapproximated the subcutaneous tissue with interrupted 2-0 Vicryl suture.  We reapproximate skin with Steri-Strips.  The wound was then coated with bacitracin ointment.  A sterile dressing was applied.  The drapes were removed.  By report all sponge, instrument, and needle counts were correct at the end this case.  The patient was then carefully returned to the supine position.  I then remove the [place] three-point hemorrhages from her calvarium.

## 2021-12-31 ENCOUNTER — Encounter (HOSPITAL_COMMUNITY): Payer: Self-pay | Admitting: Neurosurgery

## 2021-12-31 MED ORDER — LIDOCAINE 5 % EX PTCH
1.0000 | MEDICATED_PATCH | CUTANEOUS | Status: DC
  Administered 2021-12-31 – 2022-01-07 (×8): 1 via TRANSDERMAL
  Filled 2021-12-31 (×8): qty 1

## 2021-12-31 MED ORDER — AMLODIPINE BESYLATE 5 MG PO TABS
7.5000 mg | ORAL_TABLET | Freq: Every day | ORAL | Status: DC
  Administered 2022-01-01 – 2022-01-07 (×7): 7.5 mg via ORAL
  Filled 2021-12-31 (×7): qty 1

## 2021-12-31 MED ORDER — AMLODIPINE BESYLATE 2.5 MG PO TABS
2.5000 mg | ORAL_TABLET | Freq: Once | ORAL | Status: AC
  Administered 2021-12-31: 2.5 mg via ORAL
  Filled 2021-12-31: qty 1

## 2021-12-31 NOTE — Progress Notes (Signed)
Occupational Therapy Treatment Note  Began education on theraputty exercises to increase functional use of L hand. Pt very appreciative. Continue to recommend [facility] for intensive rehab. Pt very motivated to become more independent and DC home.    12/31/21 1630  OT Visit Information  Last OT Received On 12/31/21  Assistance Needed +2 (for safe ambulation)  History of Present Illness 66 year old s/p 3 stage anterior/posterior fusion (C5-6 corpectomy, post fusion C3-7; ACDF C3-7) with post op weakness (L weaker than R). Postop MRI small amount of epidural fluid at C5-6. Pt is s/p revision on 6/8 for T1 pedicle screw.  PMH: previous C5-6 and C6-7 anterior discectomy and interbody fusion in 2004; C3-4 and C4-5 anterior cervical discectomy fusion plating 2017;  fall on 10/29/2021 suffering a C5-6 fracture with persistent neck pain; R TKR; Bipolar; fibromyalgia, scleroderma; arthritis, depression, DDD, tuberculosis, L THA.  Precautions  Precautions Cervical;Fall  Precaution Comments cervical  Required Braces or Orthoses Cervical Brace  Cervical Brace Hard collar  Restrictions  Weight Bearing Restrictions No  Pain Assessment  Pain Assessment 0-10  Pain Score 4  Pain Location neck  Pain Descriptors / Indicators Aching;Discomfort  Pain Intervention(s) Limited activity within patient's tolerance  Cognition  Arousal/Alertness Awake/alert  Behavior During Therapy WFL for tasks assessed/performed  Overall Cognitive Status Within Functional Limits for tasks assessed  Exercises  Exercises General Upper Extremity;Hand exercises  General Exercises - Upper Extremity  Digit Composite Flexion AROM;Left;10 reps  Composite Extension AROM;Left;10 reps  Hand Exercises  Digit Lifts Left;10 reps;Squeeze ball  Opposition AROM;Left;10 reps  Other Exercises  Other Exercises educated on theraputty (tan) for grip and pinch strengthing  Other Exercises focu on smooth controlled movement patterns with use of visual  feedback  OT - End of Session  Activity Tolerance Patient tolerated treatment well  Patient left in bed;with call bell/phone within reach;with bed alarm set  Nurse Communication Mobility status (use RW; stand pivot to [facility]; gait belt)  OT Assessment/Plan  OT Plan Discharge plan remains appropriate  OT Visit Diagnosis Unsteadiness on feet (R26.81);Other abnormalities of gait and mobility (R26.89);Muscle weakness (generalized) (M62.81);History of falling (Z91.81);Ataxia, unspecified (R27.0);Apraxia (R48.2);Pain  OT Frequency (ACUTE ONLY) Min 3X/week  Recommendations for Other Services Rehab consult  Follow Up Recommendations Acute inpatient rehab (3hours/day)  Assistance recommended at discharge Frequent or constant Supervision/Assistance  Patient can return home with the following A lot of help with walking and/or transfers;A lot of help with bathing/dressing/bathroom;Assistance with cooking/housework;Assist for transportation;Help with stairs or ramp for entrance  OT Equipment BSC/3in1;Wheelchair (measurements OT);Wheelchair cushion (measurements OT)  AM-PAC OT "6 Clicks" Daily Activity Outcome Measure (Version 2)  Help from another person eating meals? 3  Help from another person taking care of personal grooming? 2  Help from another person toileting, which includes using toliet, bedpan, or urinal? 2  Help from another person bathing (including washing, rinsing, drying)? 2  Help from another person to put on and taking off regular upper body clothing? 2  Help from another person to put on and taking off regular lower body clothing? 2  6 Click Score 13  Progressive Mobility  What is the highest level of mobility based on the progressive mobility assessment? Level 4 (Walks with assist in room) - Balance while marching in place and cannot step forward and back - Complete  Activity Moved into chair position in bed  OT Goal Progression  Progress towards OT goals Progressing toward goals  Acute  Rehab OT Goals  Patient Stated Goal to  get better  OT Goal Formulation With patient  Time For Goal Achievement 01/13/22  Potential to Achieve Goals Good  ADL Goals  Pt Will Perform Upper Body Bathing with set-up;with supervision;sitting  Pt Will Perform Lower Body Bathing with min guard assist;sitting/lateral leans;sit to/from stand  Pt Will Perform Upper Body Dressing with set-up;with supervision;sitting  Pt Will Perform Lower Body Dressing with min assist;sitting/lateral leans;sit to/from stand  Pt Will Transfer to Toilet stand pivot transfer;with min assist;bedside commode  Pt Will Perform Toileting - Clothing Manipulation and hygiene with supervision;with set-up;sitting/lateral leans  Additional ADL Goal #1 Pt will independently verbalize 3 cervical precautions  Pt/caregiver will Perform Home Exercise Program Increased strength;Increased ROM;Right Upper extremity;Left upper extremity;With theraputty;With written HEP provided;Independently  OT Time Calculation  OT Start Time (ACUTE ONLY) 1547  OT Stop Time (ACUTE ONLY) 1603  OT Time Calculation (min) 16 min  OT General Charges  $OT Visit 1 Visit  OT Treatments  $Neuromuscular Re-education 8-22 mins  Maurie Boettcher, OT/L   Acute OT Clinical Specialist Acute Rehabilitation Services Pager [phone] Office [phone]

## 2021-12-31 NOTE — Progress Notes (Signed)
Providing Compassionate, Quality Care - Together   Subjective: Patient working with PT/OT. Patient reports decreased coordination, but feels like her left upper extremity continues to improve since her hardware revision last night. Her posterior drain came out overnight.   Objective: Vital signs in last 24 hours: Temp:  [97 F (36.1 C)-98.3 F (36.8 C)] 98.2 F (36.8 C) (06/09 0733) Pulse Rate:  [76-96] 82 (06/09 0733) Resp:  [11-23] 17 (06/09 0733) BP: (124-159)/(80-94) 151/94 (06/09 0733) SpO2:  [94 %-100 %] 95 % (06/09 0733) Weight:  [59.9 kg] 59.9 kg (06/08 1338)  Intake/Output from previous day: 06/08 0701 - 06/09 0700 In: 1841.3 [I.V.:1741.3; IV Piggyback:100] Out: 245 [Urine:160; Drains:85] Intake/Output this shift: Total I/O In: 500 [P.O.:500] Out: -   Alert and oriented x 4 PERRLA CN II-XII grossly intact MAE, Generalized weakness and decreased coordination bilateral upper and bilateral lower extremities Anterior and posterior cervical incisions are covered with Honeycomb dressings and Steri Strips; Dressings are clean, dry, and intact Anterior JP drain in place and charged. Moderate amount of sanguinous drainage in bulb.  Lab Results: Recent Labs    12/29/21 1737 12/30/21 0359  WBC  --  14.8*  HGB 10.2* 9.8*  HCT 30.0* 28.3*  PLT  --  219   BMET Recent Labs    12/29/21 1737 12/30/21 0359  NA 137 131*  K 3.4* 3.8  CL  --  99  CO2  --  24  GLUCOSE  --  136*  BUN  --  <5*  CREATININE  --  0.39*  CALCIUM  --  8.7*    Studies/Results: DG Cervical Spine 2 or 3 views  Result Date: 12/30/2021 CLINICAL DATA:  Cervical revision EXAM: CERVICAL SPINE - 2-3 VIEW COMPARISON:  CT 10/29/2021 FINDINGS: Anterior fusion from C7 through T1 with corpectomy of C6. Posterior fusion from C3 to the lower cervical/upper thoracic region. Detail is limited on the lateral view. IMPRESSION: Anterior and fusion procedures. Lower detail is limited in the lateral projection.  Electronically Signed   By: Nelson Chimes M.D.   On: 12/30/2021 16:00   DG C-Arm 1-60 Min-No Report  Result Date: 12/30/2021 Fluoroscopy was utilized by the requesting physician.  No radiographic interpretation.   CT CERVICAL SPINE WO CONTRAST  Result Date: 12/30/2021 CLINICAL DATA:  Cervical radiculopathy after surgery EXAM: CT CERVICAL SPINE WITHOUT CONTRAST TECHNIQUE: Multidetector CT imaging of the cervical spine was performed without intravenous contrast. Multiplanar CT image reconstructions were also generated. RADIATION DOSE REDUCTION: This exam was performed according to the departmental dose-optimization program which includes automated exposure control, adjustment of the mA and/or kV according to patient size and/or use of iterative reconstruction technique. COMPARISON:  10/29/2021 and preceding cervical MRI. FINDINGS: Alignment: Improved alignment after reduction of 3 column fractures seen in April. New baseline Skull base and vertebrae: Posterior-lateral fusion hardware spanning C3-T1. The hardware is intact. Revision of ACDF hardware which spans C4 to C7-T1 with corpectomy cage at C6. Preexistent solid arthrodesis at C3-4, C4-5, and C6-7. There has been decompression of the fractured bilateral posterior elements at C6-7. no hardware fracture or transcanal screw placement. The left T1 pedicle screw partially traverses the upper T1-2 foramen. The left C5 lateral mass screw is in close proximity to the transverse foramen, but lateral to it and there is preserved flow void by MRI. Soft tissues and spinal canal: Expected soft tissue gas and swelling in the posterior soft tissues. No evidence of canal hematoma by prior MRI. Disc levels:  No  bony canal or foraminal impingement. Upper chest: Clear apical lungs. IMPRESSION: Cervical spine fracture reduction and fixation with corpectomy at the C5-6 level. No hardware fracture or displacement. The left T1 pedicle screw partially traverses the left T1-2  foramen; no cervical foraminal encroachment to explain cervical radiculopathy. Electronically Signed   By: Jorje Guild M.D.   On: 12/30/2021 09:55   MR CERVICAL SPINE W WO CONTRAST  Result Date: 12/30/2021 CLINICAL DATA:  Arm weakness, left and worsening EXAM: MRI CERVICAL SPINE WITHOUT AND WITH CONTRAST TECHNIQUE: Multiplanar and multiecho pulse sequences of the cervical spine, to include the craniocervical junction and cervicothoracic junction, were obtained without and with intravenous contrast. CONTRAST:  56m GADAVIST GADOBUTROL 1 MMOL/ML IV SOLN COMPARISON:  10/29/2021 FINDINGS: Alignment: Improved alignment after reduction of displaced cervical spine fracture seen previously. No acute malalignment. Vertebrae: Interval posterior fusion spanning C3-T1. Pre-existing C3-C7 ACDF with solid arthrodesis but complicating fracture. No evidence of acute fracture. Cord: Normal cord signal.  No residual spinal canal hematoma seen. Posterior Fossa, vertebral arteries, paraspinal tissues: Posterior exposure fluid, expected. Redemonstrated C5-6 ligamentum flavum cleft. Mild prevertebral fluid is well. Postoperative fluid do not cause significant mass effect. Disc levels: There is mild posterior cord indentation due to ridging at C5-6. Diffusely patent foramina. IMPRESSION: 1. Interval reduction and fusion of cervical spine fracture seen last month. No unexpected postoperative features. Resolved epidural hematoma. 2. 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 Diffusely patent appearance of the foramina. Electronically Signed   By: JJorje GuildM.D.   On: 12/30/2021 06:15   DG Cervical Spine 2 or 3 views  Result Date: 12/29/2021 CLINICAL DATA:  Anterior cervical decompression EXAM: CERVICAL SPINE - 2-3 VIEW COMPARISON:  10/29/2021 FLUOROSCOPY TIME:  Radiation Exposure Index (as provided by the fluoroscopic device): 5.44 mGy If the device does not provide the exposure index: Fluoroscopy Time:  58  seconds Number of Acquired Images:  4 FINDINGS: Initial images again demonstrate anterior fixation from C3-C5 with significant increased kyphosis at C5-6 similar to that noted on prior CT examination. This hardware was subsequently removed as well as C5-6 corpectomy with placement of interbody prosthesis at C6. Improvement of the previously seen kyphosis is noted. IMPRESSION: C5-6 corpectomy with placement interbody prosthesis. Electronically Signed   By: MInez CatalinaM.D.   On: 12/29/2021 19:33   DG Cervical Spine 1 View  Result Date: 12/29/2021 CLINICAL DATA:  Anterior cervical decompression and fusion. EXAM: DG CERVICAL SPINE - 1 VIEW COMPARISON:  CT cervical spine 10/29/2021. FINDINGS: Posterior and anterior fusion hardware seen from the level of C3 extending inferiorly past the level of C5. Hardware is incompletely imaged. Patient is intubated. Enteric tube is present. Alignment is grossly anatomic. IMPRESSION: 1. Posterior and anterior fusion hardware is present from the level of C3 extending at least to the level of C5. Alignment is grossly anatomic. Electronically Signed   By: ARonney AstersM.D.   On: 12/29/2021 19:27   DG C-Arm 1-60 Min-No Report  Result Date: 12/29/2021 Fluoroscopy was utilized by the requesting physician.  No radiographic interpretation.   DG C-Arm 1-60 Min-No Report  Result Date: 12/29/2021 Fluoroscopy was utilized by the requesting physician.  No radiographic interpretation.   DG C-Arm 1-60 Min-No Report  Result Date: 12/29/2021 Fluoroscopy was utilized by the requesting physician.  No radiographic interpretation.   DG C-Arm 1-60 Min-No Report  Result Date: 12/29/2021 Fluoroscopy was utilized by the requesting physician.  No radiographic interpretation.   DG C-Arm 1-60 Min-No  Report  Result Date: 12/29/2021 Fluoroscopy was utilized by the requesting physician.  No radiographic interpretation.   DG C-Arm 1-60 Min-No Report  Result Date: 12/29/2021 Fluoroscopy was  utilized by the requesting physician.  No radiographic interpretation.   DG C-Arm 1-60 Min-No Report  Result Date: 12/29/2021 Fluoroscopy was utilized by the requesting physician.  No radiographic interpretation.    Assessment/Plan: Patient underwent C6 corpectomy with anterior plating from C4-T1 and posterior lateral arthrodesis from C3 to T1 by Dr. [name] on 12/29/2021. She returned to the OR for revision of the left T1 pedicle screw on 12/30/2021. Her strength and coordination are gradually improving.    LOS: 2 days    -Therapies recommending CIR at discharge. -Added lidocaine patch per patient request for intrascapular pain. -Anterior JP drain removed.    Viona Gilmore, DNP, AGNP-C Nurse Practitioner  [facility] [place] [address], [address], [place], [place] [address] P: [phone]    F: [phone]  12/31/2021, 11:36 AM

## 2021-12-31 NOTE — Progress Notes (Signed)
Occupational Therapy Treatment Patient Details Name: Rachel Duran MRN: [id number] DOB: Jun 04, 1956 Today's Date: 12/31/2021   History of present illness 66 year old s/p 3 stage anterior/posterior fusion (C5-6 corpectomy, post fusion C3-7; ACDF C3-7) with post op weakness (L weaker than R). Postop MRI small amount of epidural fluid at C5-6. Pt is s/p revision on 6/8 for T1 pedicle screw.  PMH: previous C5-6 and C6-7 anterior discectomy and interbody fusion in 2004; C3-4 and C4-5 anterior cervical discectomy fusion plating 2017;  fall on 10/29/2021 suffering a C5-6 fracture with persistent neck pain; R TKR; Bipolar; fibromyalgia, scleroderma; arthritis, depression, DDD, tuberculosis, L THA.   OT comments  Pt seen s/p cervical revision as noted above. LUE functional use improved, however pt continues to require mod A +2 for ambulation @ RW level and mod to Max A for LB ADL due to deficits listed below. Excellent AIR candidate to maximize functional level fo independence to facilitate sfae DC home with supportive family. Pt asked to return with theraputty so she could work on her hands over the weekend. Will continue to follow.   Recommendations for follow up therapy are one component of a multi-disciplinary discharge planning process, led by the attending physician.  Recommendations may be updated based on patient status, additional functional criteria and insurance authorization.    Follow Up Recommendations  Acute inpatient rehab (3hours/day)    Assistance Recommended at Discharge Frequent or constant Supervision/Assistance  Patient can return home with the following  A lot of help with walking and/or transfers;A lot of help with bathing/dressing/bathroom;Assistance with cooking/housework;Assist for transportation;Help with stairs or ramp for entrance   Equipment Recommendations  BSC/3in1;Wheelchair (measurements OT);Wheelchair cushion (measurements OT)    Recommendations for Other Services  Rehab consult    Precautions / Restrictions Precautions Precautions: Cervical;Fall Precaution Booklet Issued: Yes (comment) Precaution Comments: cervical Required Braces or Orthoses: Cervical Brace Cervical Brace: Hard collar (can have off in bed and when eating) Restrictions Weight Bearing Restrictions: No       Mobility Bed Mobility Overal bed mobility: Needs Assistance Bed Mobility: Supine to Sit     Supine to sit: Min assist     General bed mobility comments: Assist for LE assist. Educated about log roll technique to maintain precautions.    Transfers Overall transfer level: Needs assistance Equipment used: 2 person hand held assist Transfers: Sit to/from Stand, Bed to chair/wheelchair/BSC Sit to Stand: Mod assist, +2 physical assistance Stand pivot transfers: Mod assist, Min assist, +2 physical assistance         General transfer comment: mod A +2 for safety and steadying throughout. Pt with decreased proprioception, especially in RLE and placed RLE too far posteriorly when standing at [facility].     Balance Overall balance assessment: Needs assistance Sitting-balance support: No upper extremity supported Sitting balance-Leahy Scale: Fair     Standing balance support: Bilateral upper extremity supported Standing balance-Leahy Scale: Poor Standing balance comment: Reliant on BUE support                           ADL either performed or assessed with clinical judgement   ADL Overall ADL's : Needs assistance/impaired Eating/Feeding: Supervision/ safety;Set up;Sitting (using red tubing)   Grooming: Moderate assistance;Sitting   Upper Body Bathing: Minimal assistance;Sitting   Lower Body Bathing: Moderate assistance;Sit to/from stand   Upper Body Dressing : Moderate assistance;Sitting   Lower Body Dressing: Maximal assistance;Sit to/from stand   Toilet Transfer: Moderate assistance;+2 for safety/equipment  Toilet Transfer Details (indicate cue type  and reason): ablet o use RW Toileting- Clothing Manipulation and Hygiene: Moderate assistance;Sit to/from stand       Functional mobility during ADLs: Moderate assistance;+2 for physical assistance General ADL Comments: unableto complete figure four BLE; at baseline difficulty with RLE due to knee replacement    Extremity/Trunk Assessment Upper Extremity Assessment RUE Deficits / Details: improved as compared to yesterday; strength @ 4/5; decreased fine motor and in-hand manipulation skills LUE Deficits / Details: apparent neuropraxia; more impaired than R; AROM overall improved from last last, able to reach hand ot mouth but unable to touch top of head; stronger proximally @ 4/5; distally @ 3+/5; ablet o complete gross grasp adn oppose thumb to all digits; poor fine motor/in-hand manipulations skills; freqent drops with items; significant improvement from yesterday   Lower Extremity Assessment Lower Extremity Assessment: Defer to PT evaluation (poor proprioception BLE) RLE Deficits / Details: Decreased coordination and proprioception noted. RLE Sensation: decreased proprioception;decreased light touch RLE Coordination: decreased gross motor;decreased fine motor LLE Deficits / Details: Grossly 4/5 at knee and 3/5 at hip. Ataxic movements and decreased coordination during heel to shin test. LLE Sensation: decreased proprioception;decreased light touch LLE Coordination: decreased gross motor;decreased fine motor   Cervical / Trunk Assessment Cervical / Trunk Assessment: Neck Surgery    Vision       Perception     Praxis      Cognition Arousal/Alertness: Awake/alert Behavior During Therapy: WFL for tasks assessed/performed Overall Cognitive Status: Within Functional Limits for tasks assessed                                          Exercises Exercises: Other exercises Other Exercises Other Exercises: grip strengthening in addition to working on slow controlled  movement patterns with LUE due to neuropraxia    Shoulder Instructions       General Comments      Pertinent Vitals/ Pain       Pain Assessment Pain Assessment: 0-10 Pain Score: 5  Faces Pain Scale: Hurts little more Pain Location: neck Pain Descriptors / Indicators: Aching, Discomfort Pain Intervention(s): Limited activity within patient's tolerance, Premedicated before session  Home Living Family/patient expects to be discharged to:: Private residence Living Arrangements: Spouse/significant other Available Help at Discharge: Family;Available 24 hours/day Type of Home: House Home Access: Level entry     Home Layout: Two level;Able to live on main level with bedroom/bathroom     Bathroom Shower/Tub: Tub/shower unit;Walk-in shower;Curtain   Bathroom Toilet: Handicapped height Bathroom Accessibility: Yes   Home Equipment: Conservation officer, nature (2 wheels);Cane - single point;Shower seat - built in;Toilet riser          Prior Functioning/Environment              Frequency  Min 3X/week        Progress Toward Goals  OT Goals(current goals can now be found in the care plan section)  Progress towards OT goals: Progressing toward goals  Acute Rehab OT Goals Patient Stated Goal: to get better OT Goal Formulation: With patient Time For Goal Achievement: 01/13/22 Potential to Achieve Goals: Good ADL Goals Pt Will Perform Upper Body Bathing: with set-up;with supervision;sitting Pt Will Perform Lower Body Bathing: with min guard assist;sitting/lateral leans;sit to/from stand Pt Will Perform Upper Body Dressing: with set-up;with supervision;sitting Pt Will Perform Lower Body Dressing: with min assist;sitting/lateral leans;sit to/from  stand Pt Will Transfer to Toilet: stand pivot transfer;with min assist;bedside commode Pt Will Perform Toileting - Clothing Manipulation and hygiene: with supervision;with set-up;sitting/lateral leans Pt/caregiver will Perform Home Exercise  Program: Increased strength;Increased ROM;Right Upper extremity;Left upper extremity;With theraputty;With written HEP provided;Independently  Plan Discharge plan remains appropriate    Co-evaluation    PT/OT/SLP Co-Evaluation/Treatment: Yes Reason for Co-Treatment: For patient/therapist safety;To address functional/ADL transfers PT goals addressed during session: Mobility/safety with mobility;Balance        AM-PAC OT "6 Clicks" Daily Activity     Outcome Measure   Help from another person eating meals?: A Little Help from another person taking care of personal grooming?: A Lot Help from another person toileting, which includes using toliet, bedpan, or urinal?: A Lot Help from another person bathing (including washing, rinsing, drying)?: A Lot Help from another person to put on and taking off regular upper body clothing?: A Lot Help from another person to put on and taking off regular lower body clothing?: A Lot 6 Click Score: 13    End of Session Equipment Utilized During Treatment: Rolling walker (2 wheels);Gait belt  OT Visit Diagnosis: Unsteadiness on feet (R26.81);Other abnormalities of gait and mobility (R26.89);Muscle weakness (generalized) (M62.81);History of falling (Z91.81);Ataxia, unspecified (R27.0);Apraxia (R48.2);Pain Pain - part of body:  (neck)   Activity Tolerance Patient tolerated treatment well   Patient Left in chair;with call bell/phone within reach;with chair alarm set   Nurse Communication Mobility status        Time: 0037-0488 OT Time Calculation (min): 31 min  Charges: OT General Charges $OT Visit: 1 Visit OT Treatments $Self Care/Home Management : 8-22 mins  Maurie Boettcher, OT/L   Acute OT Clinical Specialist [place] Pager [phone] Office [phone]   [facility] 12/31/2021, 4:27 PM

## 2021-12-31 NOTE — Evaluation (Signed)
Physical Therapy Evaluation Patient Details Name: Rachel Duran MRN: [id number] DOB: 1955-11-29 Today's Date: 12/31/2021  History of Present Illness  66 year old s/p 3 stage anterior/posterior fusion (C5-6 corpectomy, post fusion C3-7; ACDF C3-7) with post op weakness (L weaker than R). Postop MRI small amount of epidural fluid at C5-6. Pt is s/p revision on 6/8 for T1 pedicle screw.  PMH: previous C5-6 and C6-7 anterior discectomy and interbody fusion in 2004; C3-4 and C4-5 anterior cervical discectomy fusion plating 2017;  fall on 10/29/2021 suffering a C5-6 fracture with persistent neck pain; R TKR; Bipolar; fibromyalgia, scleroderma; arthritis, depression, DDD, tuberculosis, L THA.  Clinical Impression  Pt admitted secondary to problem above with deficits below. Pt requiring up to mod A for steadying throughout mobility tasks. Notable ataxia and proprioception difficulty throughout mobility tasks as well as weakness in LLE>RLE. Pt very motivated to work with therapies and regain independence. Recommending AIR level therapies. Will continue to follow acutely.        Recommendations for follow up therapy are one component of a multi-disciplinary discharge planning process, led by the attending physician.  Recommendations may be updated based on patient status, additional functional criteria and insurance authorization.  Follow Up Recommendations Acute inpatient rehab (3hours/day)    Assistance Recommended at Discharge Frequent or constant Supervision/Assistance  Patient can return home with the following  Two people to help with walking and/or transfers;Two people to help with bathing/dressing/bathroom;Assistance with cooking/housework;Help with stairs or ramp for entrance;Assist for transportation    Equipment Recommendations None recommended by PT  Recommendations for Other Services       Functional Status Assessment Patient has had a recent decline in their functional status and  demonstrates the ability to make significant improvements in function in a reasonable and predictable amount of time.     Precautions / Restrictions Precautions Precautions: Cervical;Fall Precaution Booklet Issued: Yes (comment) Precaution Comments: cervical Required Braces or Orthoses: Cervical Brace Cervical Brace: Hard collar (can have off in bed) Restrictions Weight Bearing Restrictions: No      Mobility  Bed Mobility Overal bed mobility: Needs Assistance Bed Mobility: Supine to Sit     Supine to sit: Min assist     General bed mobility comments: Assist for LE assist. Educated about log roll technique to maintain precautions.    Transfers Overall transfer level: Needs assistance Equipment used: 2 person hand held assist Transfers: Sit to/from Stand, Bed to chair/wheelchair/BSC Sit to Stand: Mod assist, Min assist, +2 physical assistance Stand pivot transfers: Mod assist, Min assist, +2 physical assistance         General transfer comment: Min to mod A +2 for safety and steadying throughout. Pt with decreased proprioception, especially in RLE and placed RLE too far posteriorly when standing at [facility].    Ambulation/Gait Ambulation/Gait assistance: Min assist, +2 physical assistance Gait Distance (Feet): 3 Feet Assistive device: Rolling walker (2 wheels) Gait Pattern/deviations: Step-through pattern, Decreased stride length, Narrow base of support       General Gait Details: Cues for increasing width of steps as pt with very narrow BOS. Ataxia noted and proprioception difficulty. Min A +2 for steadying throughout with use of RW.  Stairs            Wheelchair Mobility    Modified Rankin (Stroke Patients Only)       Balance Overall balance assessment: Needs assistance Sitting-balance support: No upper extremity supported Sitting balance-Leahy Scale: Fair     Standing balance support: Bilateral upper extremity supported  Standing balance-Leahy Scale:  Poor Standing balance comment: Reliant on BUE support                             Pertinent Vitals/Pain Pain Assessment Pain Assessment: Faces Faces Pain Scale: Hurts little more Pain Location: neck Pain Descriptors / Indicators: Aching, Discomfort Pain Intervention(s): Limited activity within patient's tolerance, Monitored during session, Repositioned    Home Living Family/patient expects to be discharged to:: Private residence Living Arrangements: Spouse/significant other Available Help at Discharge: Family;Available 24 hours/day Type of Home: House Home Access: Level entry       Home Layout: Two level;Able to live on main level with bedroom/bathroom Home Equipment: Rolling Walker (2 wheels);Cane - single point;Shower seat - built in;Toilet riser      Prior Function Prior Level of Function : Independent/Modified Independent;Driving;Other (comment)                     Hand Dominance   Dominant Hand: Right    Extremity/Trunk Assessment        Lower Extremity Assessment Lower Extremity Assessment: RLE deficits/detail;LLE deficits/detail RLE Deficits / Details: Decreased coordination and proprioception noted. RLE Sensation: decreased proprioception;decreased light touch RLE Coordination: decreased gross motor;decreased fine motor LLE Deficits / Details: Grossly 4/5 at knee and 3/5 at hip. Ataxic movements and decreased coordination during heel to shin test. LLE Sensation: decreased proprioception;decreased light touch LLE Coordination: decreased gross motor;decreased fine motor    Cervical / Trunk Assessment Cervical / Trunk Assessment: Neck Surgery  Communication   Communication: No difficulties  Cognition Arousal/Alertness: Awake/alert Behavior During Therapy: WFL for tasks assessed/performed Overall Cognitive Status: Within Functional Limits for tasks assessed                                          General Comments       Exercises Other Exercises Other Exercises: Educated about performing seated toe taps for slow, controlled movement of BLE.   Assessment/Plan    PT Assessment Patient needs continued PT services  PT Problem List Decreased strength;Decreased activity tolerance;Decreased balance;Decreased mobility;Decreased coordination;Decreased cognition;Impaired sensation       PT Treatment Interventions DME instruction;Gait training;Stair training;Functional mobility training;Therapeutic activities;Therapeutic exercise;Balance training;Patient/family education;Neuromuscular re-education    PT Goals (Current goals can be found in the Care Plan section)  Acute Rehab PT Goals Patient Stated Goal: to get stronger and be independent PT Goal Formulation: With patient Time For Goal Achievement: 01/14/22 Potential to Achieve Goals: Good    Frequency Min 5X/week     Co-evaluation PT/OT/SLP Co-Evaluation/Treatment: Yes Reason for Co-Treatment: Necessary to address cognition/behavior during functional activity;Complexity of the patient's impairments (multi-system involvement);For patient/therapist safety PT goals addressed during session: Mobility/safety with mobility;Balance         AM-PAC PT "6 Clicks" Mobility  Outcome Measure Help needed turning from your back to your side while in a flat bed without using bedrails?: A Little Help needed moving from lying on your back to sitting on the side of a flat bed without using bedrails?: A Lot Help needed moving to and from a bed to a chair (including a wheelchair)?: Total Help needed standing up from a chair using your arms (e.g., wheelchair or bedside chair)?: Total Help needed to walk in hospital room?: Total Help needed climbing 3-5 steps with a railing? : Total 6 Click  Score: 9    End of Session Equipment Utilized During Treatment: Gait belt;Cervical collar Activity Tolerance: Patient tolerated treatment well Patient left: in chair;with call  bell/phone within reach;with chair alarm set Nurse Communication: Mobility status PT Visit Diagnosis: Unsteadiness on feet (R26.81);Muscle weakness (generalized) (M62.81);Ataxic gait (R26.0)    Time: 3300-7622 PT Time Calculation (min) (ACUTE ONLY): 31 min   Charges:   PT Evaluation $PT Eval Moderate Complexity: 1 Mod          Reuel Derby, PT, DPT  Acute Rehabilitation Services  Office: [phone]   Rudean Hitt 12/31/2021, 12:56 PM

## 2021-12-31 NOTE — TOC Progression Note (Signed)
Transition of Care [facility]) - Progression Note    Patient Details  Name: Rachel Duran MRN: [id number] Date of Birth: 06-Nov-1955  Transition of Care [facility]) CM/SW [place], RN Phone [phone]  12/31/2021, 11:26 AM  Clinical Narrative:    TOC acknowledges that another general consult has been ordered for TOC. TOC continues to follow. CIR following with postoperative therapy evals pending for disposition planning.         Expected Discharge Plan and Services                                                 Social Determinants of Health (SDOH) Interventions    Readmission Risk Interventions     No data to display

## 2021-12-31 NOTE — Progress Notes (Signed)
Inpatient Rehabilitation Admissions Coordinator   Rehab consult received. I await postoperative therapy evals to assist with planning most appropriate rehab venue.  Danne Baxter, RN, MSN Rehab Admissions Coordinator [phone] 12/31/2021 10:23 AM

## 2021-12-31 NOTE — Progress Notes (Signed)
  Inpatient Rehabilitation Admissions Coordinator   I spoke with patient by phone for rehab assessment. We discussed goals and expectations of a possible Cir admit. Her spouse can provide caregiver supports 24/7 at home and she prefers Cir admit. I will begin insurance Auth with [facility] Medicare for a possible Cir admit.  Danne Baxter, RN, MSN Rehab Admissions Coordinator [phone] 12/31/2021 3:05 PM

## 2022-01-01 MED ORDER — KETOROLAC TROMETHAMINE 30 MG/ML IJ SOLN
30.0000 mg | Freq: Four times a day (QID) | INTRAMUSCULAR | Status: AC
  Administered 2022-01-01 – 2022-01-04 (×12): 30 mg via INTRAVENOUS
  Filled 2022-01-01 (×12): qty 1

## 2022-01-01 MED ORDER — ESZOPICLONE 3 MG PO TABS
3.0000 mg | ORAL_TABLET | Freq: Every day | ORAL | Status: DC
  Administered 2022-01-01 – 2022-01-06 (×6): 3 mg via ORAL
  Filled 2022-01-01: qty 1

## 2022-01-01 MED ORDER — ESZOPICLONE 2 MG PO TABS
3.0000 mg | ORAL_TABLET | Freq: Every day | ORAL | Status: DC
Start: 2022-01-01 — End: 2022-01-01

## 2022-01-01 NOTE — Progress Notes (Signed)
Physical Therapy Treatment Patient Details Name: Rachel Duran MRN: [id number] DOB: Dec 20, 1955 Today's Date: 01/01/2022   History of Present Illness 66 year old s/p 3 stage anterior/posterior fusion (C5-6 corpectomy, post fusion C3-7; ACDF C3-7) with post op weakness (L weaker than R). Postop MRI small amount of epidural fluid at C5-6. Pt is s/p revision on 6/8 for T1 pedicle screw.  PMH: previous C5-6 and C6-7 anterior discectomy and interbody fusion in 2004; C3-4 and C4-5 anterior cervical discectomy fusion plating 2017;  fall on 10/29/2021 suffering a C5-6 fracture with persistent neck pain; R TKR; Bipolar; fibromyalgia, scleroderma; arthritis, depression, DDD, tuberculosis, L THA.    PT Comments    The pt was able to demo good progress with dynamic stability and endurance for hallway ambulation at this time. She continues to demo significant deficits in proprioception, coordination, and dynamic stability but is highly motivated and making good progress. The pt completed x3 bouts of walking (60f + 45 ft + 75 ft) with seated rest between each bout. Continues to require cues for gait pattern and to avoid drifting to R and L with gait. Will continue to benefit from skilled PT to progress independence with mobility and decrease risk of falls after d/c.    Recommendations for follow up therapy are one component of a multi-disciplinary discharge planning process, led by the attending physician.  Recommendations may be updated based on patient status, additional functional criteria and insurance authorization.  Follow Up Recommendations  Acute inpatient rehab (3hours/day)     Assistance Recommended at Discharge Frequent or constant Supervision/Assistance  Patient can return home with the following Two people to help with walking and/or transfers;Two people to help with bathing/dressing/bathroom;Assistance with cooking/housework;Help with stairs or ramp for entrance;Assist for transportation    Equipment Recommendations  None recommended by PT    Recommendations for Other Services       Precautions / Restrictions Precautions Precautions: Cervical;Fall Precaution Booklet Issued: Yes (comment) Precaution Comments: cervical Required Braces or Orthoses: Cervical Brace Cervical Brace: Hard collar (can have off in bed) Restrictions Weight Bearing Restrictions: No     Mobility  Bed Mobility Overal bed mobility: Needs Assistance Bed Mobility: Rolling, Sidelying to Sit Rolling: Min guard Sidelying to sit: Min assist       General bed mobility comments: minG and minA to cue for log roll, minA to elevate trunk and move LE off EOB    Transfers Overall transfer level: Needs assistance Equipment used: Rolling walker (2 wheels) Transfers: Sit to/from Stand, Bed to chair/wheelchair/BSC Sit to Stand: Min assist, Min guard   Step pivot transfers: Min assist       General transfer comment: minA to rise initially with mod cues for hand placement. minA to manage balance while taking pivotal steps. Pt progressing to minG at times with sit-stand transfers    Ambulation/Gait Ambulation/Gait assistance: Min assist, +2 safety/equipment (chair follow) Gait Distance (Feet): 45 Feet (+ 433f+ 75 ft) Assistive device: Rolling walker (2 wheels) Gait Pattern/deviations: Step-through pattern, Decreased stride length, Narrow base of support, Ataxic, Drifts right/left Gait velocity: decreased     General Gait Details: cues for increased stride length and heel-toe pattern, pt drifting to R mostly but L at times as well. cued to scan hallway and maintain midline position with slightly improved performance. HR to 138 bpm, seated rest x3       Balance Overall balance assessment: Needs assistance Sitting-balance support: No upper extremity supported Sitting balance-Leahy Scale: Fair     Standing balance  support: Bilateral upper extremity supported Standing balance-Leahy Scale:  Poor Standing balance comment: Reliant on BUE support                            Cognition Arousal/Alertness: Awake/alert Behavior During Therapy: WFL for tasks assessed/performed Overall Cognitive Status: Within Functional Limits for tasks assessed                                          Exercises      General Comments General comments (skin integrity, edema, etc.): HR to 138 bpm with activity. SpO2 stable on RA      Pertinent Vitals/Pain Pain Assessment Pain Assessment: Faces Faces Pain Scale: Hurts little more Pain Location: neck Pain Descriptors / Indicators: Aching, Discomfort Pain Intervention(s): Monitored during session, Repositioned, Premedicated before session     PT Goals (current goals can now be found in the care plan section) Acute Rehab PT Goals Patient Stated Goal: to get stronger and be independent PT Goal Formulation: With patient Time For Goal Achievement: 01/14/22 Potential to Achieve Goals: Good Progress towards PT goals: Progressing toward goals    Frequency    Min 5X/week      PT Plan Current plan remains appropriate       AM-PAC PT "6 Clicks" Mobility   Outcome Measure  Help needed turning from your back to your side while in a flat bed without using bedrails?: A Little Help needed moving from lying on your back to sitting on the side of a flat bed without using bedrails?: A Little Help needed moving to and from a bed to a chair (including a wheelchair)?: A Little Help needed standing up from a chair using your arms (e.g., wheelchair or bedside chair)?: A Little Help needed to walk in hospital room?: A Lot Help needed climbing 3-5 steps with a railing? : Total 6 Click Score: 15    End of Session Equipment Utilized During Treatment: Gait belt;Cervical collar Activity Tolerance: Patient tolerated treatment well Patient left: in chair;with call bell/phone within reach;with chair alarm set;with  family/visitor present Nurse Communication: Mobility status PT Visit Diagnosis: Unsteadiness on feet (R26.81);Muscle weakness (generalized) (M62.81);Ataxic gait (R26.0)     Time: 8099-8338 PT Time Calculation (min) (ACUTE ONLY): 28 min  Charges:  $Gait Training: 8-22 mins $Therapeutic Exercise: 8-22 mins                     West Carbo, PT, DPT   Acute Rehabilitation Department   Sandra Cockayne 01/01/2022, 1:51 PM

## 2022-01-01 NOTE — Progress Notes (Signed)
Providing Compassionate, Quality Care - Together   Subjective: Patient reports significant intrascapular pain. Lidocaine patches ordered yesterday help a little.  Objective: Vital signs in last 24 hours: Temp:  [98.1 F (36.7 C)-98.4 F (36.9 C)] 98.3 F (36.8 C) (06/10 0811) Pulse Rate:  [87-101] 91 (06/10 0811) Resp:  [15-19] 17 (06/10 0811) BP: (129-158)/(74-99) 154/99 (06/10 0811) SpO2:  [93 %-96 %] 95 % (06/10 0811)  Intake/Output from previous day: 06/09 0701 - 06/10 0700 In: 500 [P.O.:500] Out: -  Intake/Output this shift: No intake/output data recorded.  Alert and oriented x 4 PERRLA CN II-XII grossly intact MAE, Generalized weakness and decreased coordination bilateral upper and bilateral lower extremities Anterior and posterior cervical incisions are covered with Honeycomb dressings and Steri Strips; Dressings are clean, dry, and intact  Lab Results: Recent Labs    12/29/21 1737 12/30/21 0359  WBC  --  14.8*  HGB 10.2* 9.8*  HCT 30.0* 28.3*  PLT  --  219   BMET Recent Labs    12/29/21 1737 12/30/21 0359  NA 137 131*  K 3.4* 3.8  CL  --  99  CO2  --  24  GLUCOSE  --  136*  BUN  --  <5*  CREATININE  --  0.39*  CALCIUM  --  8.7*    Studies/Results: DG Cervical Spine 2 or 3 views  Result Date: 12/30/2021 CLINICAL DATA:  Cervical revision EXAM: CERVICAL SPINE - 2-3 VIEW COMPARISON:  CT 10/29/2021 FINDINGS: Anterior fusion from C7 through T1 with corpectomy of C6. Posterior fusion from C3 to the lower cervical/upper thoracic region. Detail is limited on the lateral view. IMPRESSION: Anterior and fusion procedures. Lower detail is limited in the lateral projection. Electronically Signed   By: Nelson Chimes M.D.   On: 12/30/2021 16:00   DG C-Arm 1-60 Min-No Report  Result Date: 12/30/2021 Fluoroscopy was utilized by the requesting physician.  No radiographic interpretation.   CT CERVICAL SPINE WO CONTRAST  Result Date: 12/30/2021 CLINICAL DATA:   Cervical radiculopathy after surgery EXAM: CT CERVICAL SPINE WITHOUT CONTRAST TECHNIQUE: Multidetector CT imaging of the cervical spine was performed without intravenous contrast. Multiplanar CT image reconstructions were also generated. RADIATION DOSE REDUCTION: This exam was performed according to the departmental dose-optimization program which includes automated exposure control, adjustment of the mA and/or kV according to patient size and/or use of iterative reconstruction technique. COMPARISON:  10/29/2021 and preceding cervical MRI. FINDINGS: Alignment: Improved alignment after reduction of 3 column fractures seen in April. New baseline Skull base and vertebrae: Posterior-lateral fusion hardware spanning C3-T1. The hardware is intact. Revision of ACDF hardware which spans C4 to C7-T1 with corpectomy cage at C6. Preexistent solid arthrodesis at C3-4, C4-5, and C6-7. There has been decompression of the fractured bilateral posterior elements at C6-7. no hardware fracture or transcanal screw placement. The left T1 pedicle screw partially traverses the upper T1-2 foramen. The left C5 lateral mass screw is in close proximity to the transverse foramen, but lateral to it and there is preserved flow void by MRI. Soft tissues and spinal canal: Expected soft tissue gas and swelling in the posterior soft tissues. No evidence of canal hematoma by prior MRI. Disc levels:  No bony canal or foraminal impingement. Upper chest: Clear apical lungs. IMPRESSION: Cervical spine fracture reduction and fixation with corpectomy at the C5-6 level. No hardware fracture or displacement. The left T1 pedicle screw partially traverses the left T1-2 foramen; no cervical foraminal encroachment to explain cervical radiculopathy. Electronically  Signed   By: Jorje Guild M.D.   On: 12/30/2021 09:55    Assessment/Plan: Patient underwent C6 corpectomy with anterior plating from C4-T1 and posterior lateral arthrodesis from C3 to T1 by Dr.  [name] on 12/29/2021. She returned to the OR for revision of the left T1 pedicle screw on 12/30/2021. Her strength and coordination are gradually improving.   LOS: 3 days    -Added short course of Toradol to see if this helps with her neuropathic pain. Patient is already on 150 mg Lyrica BID. -Therapies recommending CIR at discharge.   Viona Gilmore, DNP, AGNP-C Nurse Practitioner  [facility] [place] [address], [address], [place], [place] [address] P: [phone]    F: [phone]  01/01/2022, 8:51 AM

## 2022-01-01 NOTE — Progress Notes (Signed)
Pt is having muscle spasms in her right arm, pt claims it could be from the oxycodone due to having the same reaction when she tried fentanyl patches in the past, Pt given PRN Flexeril, was effective

## 2022-01-02 LAB — BPAM RBC
Blood Product Expiration Date: 202306172359
Blood Product Expiration Date: 202306182359
ISSUE DATE / TIME: 202306071740
ISSUE DATE / TIME: 202306071740
Unit Type and Rh: 600
Unit Type and Rh: 600

## 2022-01-02 LAB — TYPE AND SCREEN
ABO/RH(D): A NEG
Antibody Screen: NEGATIVE
Unit division: 0
Unit division: 0

## 2022-01-02 NOTE — Progress Notes (Signed)
   Providing Compassionate, Quality Care - Together   Subjective: Patient reports she feels like her dexterity in her left hand is improving.  Objective: Vital signs in last 24 hours: Temp:  [97.9 F (36.6 C)-98.5 F (36.9 C)] 97.9 F (36.6 C) (06/11 0725) Pulse Rate:  [90-102] 96 (06/11 0725) Resp:  [13-19] 18 (06/11 0725) BP: (126-145)/(79-94) 145/94 (06/11 0725) SpO2:  [94 %-99 %] 94 % (06/11 0725)  Intake/Output from previous day: 06/10 0701 - 06/11 0700 In: -  Out: 1500 [Urine:1500] Intake/Output this shift: Total I/O In: 3587.3 [I.V.:3587.3] Out: -   Alert and oriented x 4 PERRLA CN II-XII grossly intact MAE, Generalized weakness and decreased coordination bilateral upper and bilateral lower extremities Anterior and posterior cervical incisions are covered with Honeycomb dressings and Steri Strips; Dressings are clean, dry, and intact    Assessment/Plan: Patient underwent C6 corpectomy with anterior plating from C4-T1 and posterior lateral arthrodesis from C3 to T1 by Dr. [name] on 12/29/2021. She returned to the OR for revision of the left T1 pedicle screw on 12/30/2021. Her strength and coordination are gradually improving.   LOS: 4 days   -Continue to encourage patient to mobilize -Therapies recommending CIR at discharge. Awaiting insurance approval.   Viona Gilmore, DNP, AGNP-C Nurse Practitioner  [facility] [place] [address], [place], [place] [address] P: [phone]    F: [phone]  01/02/2022, 11:04 AM

## 2022-01-03 NOTE — Progress Notes (Signed)
   Providing Compassionate, Quality Care - Together.    Completed peer to peer on behalf of Mrs. Rachel Duran. CIR admission denied as MD representative for [facility] Medicare feels the patient is better suited for subacute rehab setting.   Viona Gilmore, DNP, AGNP-C Nurse Practitioner  [facility] [place] [address], [place], [place] [address] P: [phone]    F: [phone]    01/03/2022 1:01 PM

## 2022-01-03 NOTE — Progress Notes (Signed)
Physical Therapy Treatment Patient Details Name: Rachel Duran MRN: [id number] DOB: 05-16-1956 Today's Date: 01/03/2022   History of Present Illness 66 year old female s/p 3-stage anterior/posterior fusion (C5-6 corpectomy, post fusion C3-7; ACDF C3-7) with post op weakness (L weaker than R). Postop MRI small amount of epidural fluid at C5-6. Pt is s/p revision on 6/8 for T1 pedicle screw.  PMH: previous C5-6 and C6-7 anterior discectomy and interbody fusion in 2004; C3-4 and C4-5 anterior cervical discectomy fusion plating 2017;  fall on 10/29/2021 suffering a C5-6 fracture with persistent neck pain; R TKR; Bipolar; fibromyalgia, scleroderma; arthritis, depression, DDD, tuberculosis, L THA.    PT Comments    The pt was agreeable to session, eager to continue with mobility progression. The pt was able to progress ambulation distance tolerated in single bout this session, but continues to need up to modA to manage balance with scissoring steps and LOB when turning as well as reporting significant fatigue in LE requiring seated rest after mobility. The pt was then challenged with repeated sit-stand transfers with use of LE only (no UE support) and was able to complete 2 x5 with improved stability and power with continued reps. Continue to recommend acute inpatient rehab at d/c to continue progression of safety and independence with gait and independence with mobility.    Recommendations for follow up therapy are one component of a multi-disciplinary discharge planning process, led by the attending physician.  Recommendations may be updated based on patient status, additional functional criteria and insurance authorization.  Follow Up Recommendations  Acute inpatient rehab (3hours/day)     Assistance Recommended at Discharge Frequent or constant Supervision/Assistance  Patient can return home with the following Two people to help with walking and/or transfers;Two people to help with  bathing/dressing/bathroom;Assistance with cooking/housework;Help with stairs or ramp for entrance;Assist for transportation   Equipment Recommendations  None recommended by PT    Recommendations for Other Services       Precautions / Restrictions Precautions Precautions: Cervical;Fall Precaution Booklet Issued: Yes (comment) Precaution Comments: cervical Required Braces or Orthoses: Cervical Brace Cervical Brace: Hard collar (can have off in bed) Restrictions Weight Bearing Restrictions: No     Mobility  Bed Mobility Overal bed mobility: Needs Assistance Bed Mobility: Rolling, Sidelying to Sit Rolling: Min guard Sidelying to sit: Min assist       General bed mobility comments: minG and minA to cue for log roll, minA to elevate trunk, pt able to manage LE    Transfers Overall transfer level: Needs assistance Equipment used: Rolling walker (2 wheels) Transfers: Sit to/from Stand, Bed to chair/wheelchair/BSC Sit to Stand: Min assist, Min guard   Step pivot transfers: Min assist       General transfer comment: minA to rise initially, progressed to minG with reps. completed 2 x 5 without use of UE to focus on LE strength    Ambulation/Gait Ambulation/Gait assistance: Min assist, Mod assist Gait Distance (Feet): 75 Feet (+ 75 ft) Assistive device: Rolling walker (2 wheels) Gait Pattern/deviations: Step-through pattern, Decreased stride length, Narrow base of support, Ataxic, Drifts right/left, Scissoring Gait velocity: decreased     General Gait Details: pt with x3 instances of scissoring with turning in hallway, modA to steady with changes in direction. improved ability to manage with straight forwards walking     Balance Overall balance assessment: Needs assistance Sitting-balance support: No upper extremity supported Sitting balance-Leahy Scale: Fair     Standing balance support: Bilateral upper extremity supported Standing balance-Leahy Scale:  Poor Standing balance comment: Reliant on BUE support                            Cognition Arousal/Alertness: Awake/alert Behavior During Therapy: WFL for tasks assessed/performed Overall Cognitive Status: Within Functional Limits for tasks assessed                                 General Comments: pt able to follow all cues and instructions        Exercises Other Exercises Other Exercises: repeated sit-stand from recliner without use of UE Other Exercises: walking marches x10 each leg Other Exercises: quick turns in room x3 wach direction    General Comments General comments (skin integrity, edema, etc.): HR to 122 with hallway ambulation. spouse present and supportive      Pertinent Vitals/Pain Pain Assessment Pain Assessment: Faces Faces Pain Scale: Hurts a little bit Pain Location: neck Pain Descriptors / Indicators: Aching, Discomfort Pain Intervention(s): Limited activity within patient's tolerance, Monitored during session, Repositioned     PT Goals (current goals can now be found in the care plan section) Acute Rehab PT Goals Patient Stated Goal: to get stronger and be independent PT Goal Formulation: With patient Time For Goal Achievement: 01/14/22 Potential to Achieve Goals: Good Progress towards PT goals: Progressing toward goals    Frequency    Min 5X/week      PT Plan Current plan remains appropriate       AM-PAC PT "6 Clicks" Mobility   Outcome Measure  Help needed turning from your back to your side while in a flat bed without using bedrails?: A Little Help needed moving from lying on your back to sitting on the side of a flat bed without using bedrails?: A Little Help needed moving to and from a bed to a chair (including a wheelchair)?: A Little Help needed standing up from a chair using your arms (e.g., wheelchair or bedside chair)?: A Little Help needed to walk in hospital room?: A Lot Help needed climbing 3-5  steps with a railing? : Total 6 Click Score: 15    End of Session Equipment Utilized During Treatment: Gait belt;Cervical collar Activity Tolerance: Patient tolerated treatment well Patient left: in chair;with call bell/phone within reach;with chair alarm set;with family/visitor present Nurse Communication: Mobility status PT Visit Diagnosis: Unsteadiness on feet (R26.81);Muscle weakness (generalized) (M62.81);Ataxic gait (R26.0)     Time: 3016-0109 PT Time Calculation (min) (ACUTE ONLY): 27 min  Charges:  $Gait Training: 8-22 mins $Therapeutic Exercise: 8-22 mins                     West Carbo, PT, DPT   Acute Rehabilitation Department   Sandra Cockayne 01/03/2022, 5:56 PM

## 2022-01-03 NOTE — Care Management Important Message (Signed)
Important Message  Patient Details  Name: Rachel Duran MRN: [id number] Date of Birth: [birth year]   Medicare Important Message Given:  Yes     Orbie Pyo 01/03/2022, 4:03 PM

## 2022-01-03 NOTE — Progress Notes (Addendum)
Inpatient Rehabilitation Admissions Coordinator   I will begin appeal with [place] for possible [facility] admit.  Danne Baxter, RN, MSN Rehab Admissions Coordinator [phone] 01/03/2022 1:47 PM  Unable to begin appeal today for I did not receive updated PT and OT treatment notes. I will follow up tomorrow.  Danne Baxter, RN, MSN Rehab Admissions Coordinator [phone] 01/03/2022 5:07 PM

## 2022-01-03 NOTE — Progress Notes (Signed)
Subjective: The patient is alert and pleasant.  She is in no apparent distress.  She is sitting in the chair without her collar.  I helped her apply it.  She feels better.  She still feels unsteady on her feet.  She has been ambulating with a walker.  Objective: Vital signs in last 24 hours: Temp:  [98.2 F (36.8 C)-98.4 F (36.9 C)] 98.4 F (36.9 C) (06/12 0318) Pulse Rate:  [98-109] 98 (06/12 0318) Resp:  [17-23] 17 (06/11 1519) BP: (125-142)/(77-88) 142/79 (06/11 2252) SpO2:  [97 %-99 %] 98 % (06/11 2252) Estimated body mass index is 23.39 kg/m as calculated from the following:   Height as of this encounter: '5\' 3"'$  (1.6 m).   Weight as of this encounter: 59.9 kg.   Intake/Output from previous day: 06/11 0701 - 06/12 0700 In: 4867.5 [P.O.:720; I.V.:4147.5] Out: -  Intake/Output this shift: No intake/output data recorded.  Physical exam the patient is alert and oriented.  Her anterior and posterior cervical incisions are healing well.  Her strength is normal except in her left hand grip which is 4/5, a bit stronger than when I last saw her.  Lab Results: No results for input(s): "WBC", "HGB", "HCT", "PLT" in the last 72 hours. BMET No results for input(s): "NA", "K", "CL", "CO2", "GLUCOSE", "BUN", "CREATININE", "CALCIUM" in the last 72 hours.  Studies/Results: No results found.  Assessment/Plan: Postop day #5: The patient is slowly improving.  She will benefit from inpatient rehab.  We are awaiting insurance approval.  LOS: 5 days     [name] 01/03/2022, 7:44 AM     Patient ID: Rachel Duran, female   DOB: [birth year], 66 y.o.   MRN: [id number]

## 2022-01-03 NOTE — Progress Notes (Signed)
Occupational Therapy Treatment Patient Details Name: Rachel Duran MRN: [id number] DOB: April 30, 1956 Today's Date: 01/03/2022   History of present illness 66 year old s/p 3 stage anterior/posterior fusion (C5-6 corpectomy, post fusion C3-7; ACDF C3-7) with post op weakness (L weaker than R). Postop MRI small amount of epidural fluid at C5-6. Pt is s/p revision on 6/8 for T1 pedicle screw.  PMH: previous C5-6 and C6-7 anterior discectomy and interbody fusion in 2004; C3-4 and C4-5 anterior cervical discectomy fusion plating 2017;  fall on 10/29/2021 suffering a C5-6 fracture with persistent neck pain; R TKR; Bipolar; fibromyalgia, scleroderma; arthritis, depression, DDD, tuberculosis, L THA.   OT comments  Rachel Duran continues to make great progress towards her goals. She was unable to recall cervical precautions, reviewed and educated with pt and husband, Rachel Duran, present. She had notable improvements in static standing balance to groom at the sink with min G. However she continues to require mod A for LOB with turning during ambulation. Pt demonstrated great understanding of hand exercises and encouraged to continue to complete them. OT to continue to follow. D/c recommendation remains appropriate.    Recommendations for follow up therapy are one component of a multi-disciplinary discharge planning process, led by the attending physician.  Recommendations may be updated based on patient status, additional functional criteria and insurance authorization.    Follow Up Recommendations  Acute inpatient rehab (3hours/day)    Assistance Recommended at Discharge Frequent or constant Supervision/Assistance  Patient can return home with the following  A lot of help with walking and/or transfers;A lot of help with bathing/dressing/bathroom;Assistance with cooking/housework;Assist for transportation;Help with stairs or ramp for entrance   Equipment Recommendations  BSC/3in1;Wheelchair (measurements  OT);Wheelchair cushion (measurements OT)    Recommendations for Other Services Rehab consult    Precautions / Restrictions Precautions Precautions: Cervical;Fall Precaution Booklet Issued: Yes (comment) Precaution Comments: reviewed precautions and log roll Required Braces or Orthoses: Cervical Brace Cervical Brace: Hard collar Restrictions Weight Bearing Restrictions: No       Mobility Bed Mobility               General bed mobility comments: in chair upon arrival    Transfers Overall transfer level: Needs assistance Equipment used: Rolling walker (2 wheels) Transfers: Sit to/from Stand Sit to Stand: Min guard           General transfer comment: mig G for standing initially; min A - mod A for ambulation     Balance Overall balance assessment: Needs assistance Sitting-balance support: No upper extremity supported Sitting balance-Leahy Scale: Fair     Standing balance support: Single extremity supported, During functional activity Standing balance-Leahy Scale: Fair                             ADL either performed or assessed with clinical judgement   ADL Overall ADL's : Needs assistance/impaired     Grooming: Min guard;Standing Grooming Details (indicate cue type and reason): min G while standing with RW at the sink. using RUE to brush hair                             Functional mobility during ADLs: Rolling walker (2 wheels);Moderate assistance;Cueing for safety General ADL Comments: pt has more difficulty with R turns, scissoring gait and requires mod A for LOB. statically standing at the sink to groom without LOB. Demo'ed good ability to complete hand exercises  Extremity/Trunk Assessment Upper Extremity Assessment Upper Extremity Assessment: RUE deficits/detail;LUE deficits/detail RUE Deficits / Details: improved as compared to yesterday; strength @ 4/5; decreased fine motor and in-hand manipulation skills RUE Sensation:  decreased light touch RUE Coordination: decreased fine motor LUE Deficits / Details: apparent neuropraxia; more impaired than R; AROM overall improved from last last, able to reach over head head; stronger proximally @ 4/5; distally 3+/5; able to complete gross grasp and oppose thumb to all digits; poor fine motor/in-hand manipulations skills; improvement noted from prior sessions - pt reports she is doing her exercises frequently LUE Sensation: decreased light touch;decreased proprioception LUE Coordination: decreased fine motor;decreased gross motor   Lower Extremity Assessment Lower Extremity Assessment: Defer to PT evaluation        Vision   Vision Assessment?: No apparent visual deficits   Perception Perception Perception: Not tested   Praxis      Cognition Arousal/Alertness: Awake/alert Behavior During Therapy: WFL for tasks assessed/performed Overall Cognitive Status: Within Functional Limits for tasks assessed                                 General Comments: unable to recall cervical precautions but able to recall L hand exercises        Exercises      Shoulder Instructions       General Comments HR to 125 with mobility, husband present    Pertinent Vitals/ Pain       Pain Assessment Pain Assessment: Faces Faces Pain Scale: Hurts a little bit Pain Location: L shoulder and neck Pain Descriptors / Indicators: Aching, Discomfort Pain Intervention(s): Limited activity within patient's tolerance, Monitored during session  Home Living                                          Prior Functioning/Environment              Frequency  Min 3X/week        Progress Toward Goals  OT Goals(current goals can now be found in the care plan section)  Progress towards OT goals: Progressing toward goals  Acute Rehab OT Goals Patient Stated Goal: to get stronger OT Goal Formulation: With patient Time For Goal Achievement:  01/13/22 Potential to Achieve Goals: Good ADL Goals Pt Will Perform Upper Body Bathing: with set-up;with supervision;sitting Pt Will Perform Lower Body Bathing: with min guard assist;sitting/lateral leans;sit to/from stand Pt Will Perform Upper Body Dressing: with set-up;with supervision;sitting Pt Will Perform Lower Body Dressing: with min assist;sitting/lateral leans;sit to/from stand Pt Will Transfer to Toilet: stand pivot transfer;with min assist;bedside commode Pt Will Perform Toileting - Clothing Manipulation and hygiene: with supervision;with set-up;sitting/lateral leans Pt/caregiver will Perform Home Exercise Program: Increased strength;Increased ROM;Right Upper extremity;Left upper extremity;With theraputty;With written HEP provided;Independently Additional ADL Goal #1: Pt will independently verbalize 3 cervical precautions  Plan Discharge plan remains appropriate    Co-evaluation                 AM-PAC OT "6 Clicks" Daily Activity     Outcome Measure   Help from another person eating meals?: A Little Help from another person taking care of personal grooming?: A Little Help from another person toileting, which includes using toliet, bedpan, or urinal?: A Lot Help from another person bathing (including washing, rinsing, drying)?: A Lot Help from  another person to put on and taking off regular upper body clothing?: A Little Help from another person to put on and taking off regular lower body clothing?: A Lot 6 Click Score: 15    End of Session Equipment Utilized During Treatment: Rolling walker (2 wheels);Gait belt  OT Visit Diagnosis: Unsteadiness on feet (R26.81);Other abnormalities of gait and mobility (R26.89);Muscle weakness (generalized) (M62.81);History of falling (Z91.81);Ataxia, unspecified (R27.0);Apraxia (R48.2);Pain   Activity Tolerance Patient tolerated treatment well   Patient Left in chair;with call bell/phone within reach;with chair alarm set   Nurse  Communication Mobility status        Time: 3662-9476 OT Time Calculation (min): 16 min  Charges: OT General Charges $OT Visit: 1 Visit OT Treatments $Therapeutic Activity: 8-22 mins    Latavious Bitter A Lenix Kidd 01/03/2022, 5:39 PM

## 2022-01-03 NOTE — Progress Notes (Signed)
Inpatient Rehabilitation Admissions Coordinator   I met with patient an her spouse at bedside. I await peer to peer with Dr [name] and insurance MD today for possible [facility] admit. If denied, they request I appeal on her behalf.  Danne Baxter, RN, MSN Rehab Admissions Coordinator [phone] 01/03/2022 12:00 PM

## 2022-01-04 NOTE — Progress Notes (Signed)
Subjective: The patient is alert and pleasant.  She is in no apparent distress.  She is awaiting rehab.  Objective: Vital signs in last 24 hours: Temp:  [97.8 F (36.6 C)-99.3 F (37.4 C)] 98.2 F (36.8 C) (06/13 0726) Pulse Rate:  [87-109] 87 (06/13 0726) Resp:  [16-20] 16 (06/13 0726) BP: (116-135)/(67-90) 134/90 (06/13 0726) SpO2:  [95 %-99 %] 97 % (06/13 0726) Estimated body mass index is 23.39 kg/m as calculated from the following:   Height as of this encounter: '5\' 3"'$  (1.6 m).   Weight as of this encounter: 59.9 kg.   Intake/Output from previous day: 06/12 0701 - 06/13 0700 In: 960 [P.O.:960] Out: 4 [Urine:3; Stool:1] Intake/Output this shift: No intake/output data recorded.  Physical exam the patient is alert and pleasant.  She is moving all 4 extremities well.  Lab Results: No results for input(s): "WBC", "HGB", "HCT", "PLT" in the last 72 hours. BMET No results for input(s): "NA", "K", "CL", "CO2", "GLUCOSE", "BUN", "CREATININE", "CALCIUM" in the last 72 hours.  Studies/Results: No results found.  Assessment/Plan: Postop day #6: The patient is doing well.  She would benefit from inpatient rehab.  We are awaiting approval.  LOS: 6 days     [name] 01/04/2022, 7:45 AM     Patient ID: Rachel Duran, female   DOB: 07-25-1956, 66 y.o.   MRN: [id number]

## 2022-01-04 NOTE — PMR Pre-admission (Signed)
PMR Admission Coordinator Pre-Admission Assessment  Patient: Rachel Duran is an 66 y.o., female MRN: [id number] DOB: [birth year] Height: $RemoveBeforeDE'5\' 3"'SBwWcBDNHniBtip$  (160 cm) Weight: 59.9 kg Insurance Information HMO:     PPO:      PCP:      IPA:      80/20:      OTHER:  PRIMARY: [employer] Medicare      Policy#: [id number]      Subscriber: pt CM Name: expedited appeal      Phone#: [phone] option 7     Fax#: [phone] Pre-Cert#: [id number] denied with [employer]. Appeal overturned approved for 7 days      Employer:  Benefits:  Phone #: [phone]     Name: 6/12 Eff. Date: 07/25/21     Deduct: $200      Out of Pocket Max: $2200 CIR: 100% coverage      SNF: 100 coverage for 100 days Outpatient: no copayment     Co-Pay: visits per medical neccesity Home Health: 100%      Co-Pay: visits epr medical neccesity DME: 100%     Co-Pay: none Providers: in network  SECONDARY: none  Financial Counselor:       Phone#:   The Engineer, petroleum" for patients in Inpatient Rehabilitation Facilities with attached "Privacy Act [place] Records" was provided and verbally reviewed with: Patient and Family  Emergency Contact Information Contact Information     Name Relation Home Work Mobile   [place] Spouse [phone]  [phone]      Current Medical History  Patient Admitting Diagnosis: cervical fracture  History of Present Illness:  66 year old right-handed female history of chronic anemia, bipolar disorder/depression/fibromyalgia, ACDF 2004 C5-6 and C6-7 and 2017 C3-4 and C4-5, left total hip arthroplasty 2020, scleroderma.  Presented 12/29/2021 with noted history of fall 10/29/2021 suffering a C5-6 fracture subluxation/cervical kyphosis with persistent neck pain and initial conservative care with failed medical management.  Underwent 3 stage anterior posterior fusion C5-6 corpectomy, posterior fusion C3-7, ACDF C3-7 12/29/2021.  Postop she developed left hand weakness consistent with  a C8 radiculopathy as well as small amount of epidural fluid at C5-6.  Cervical CT demonstrated that the left T1 pedicle screw was a bit low and into the neural foramen.  Patient underwent revision of posterior cervical instrumentation 12/30/2021 per Dr. Newman Pies.  Placed in a cervical hard collar when out of bed.  Hospital course pain management.  Acute blood loss anemia 9.8 and monitored.  Tolerating a regular consistency diet.    Patient's medical record from [facility] has been reviewed by the rehabilitation admission coordinator and physician.  Past Medical History  Past Medical History:  Diagnosis Date   Abdominal pain last 4 months   and nausea also   Allergy    Anemia    history of   Anxiety    Bipolar disorder ([place])    atpical bipolar disorder   Cervical disc disease limited rom turning to left   hx. C6- C7 -hx. past fusion(bone graft used)   Cholecystitis    Chronic pain    DDD (degenerative disc disease), lumbar 09/2015   dextroscoliosis with multilevel DDD and facet arthrosis most notable for R foraminal disc protrusion L4/5 producing severe R neural foraminal stenosis abutting R L4 nerve root, moderate spinal canal and mild lat recesss and R neural foraminal stenosis L3/4 (MRI)   Depression    bipolar depression   Disorders of porphyrin metabolism    Felon of finger of  left hand 11/10/2016   Fibromyalgia    GERD (gastroesophageal reflux disease)    Headache    occasionally   Internal hemorrhoids    Interstitial cystitis 06/06/2012   hx.   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 Yes  Family History   family history includes Arthritis in her brother; CAD (age of onset: 53) in her father; Diabetes in her maternal grandmother; Esophageal cancer in her paternal grandfather; Esophageal cancer (age of onset: 59) in her father; Hypertension in her mother; Scleroderma in her mother; Stomach cancer in her father and paternal grandfather.  Current Medications  Current Facility-Administered Medications:    acetaminophen (TYLENOL) tablet 650 mg, 650 mg, Oral, Q4H PRN **OR** acetaminophen (TYLENOL) suppository 650 mg, 650 mg, Rectal, Q4H PRN, Newman Pies, MD   acetaminophen (TYLENOL) tablet 650 mg, 650 mg, Oral, Q4H PRN, 650 mg at 01/02/22 1236 **OR** acetaminophen (TYLENOL) suppository 650 mg, 650 mg, Rectal, Q4H PRN, Newman Pies, MD   ALPRAZolam Duanne Moron) tablet 0.5 mg, 0.5 mg, Oral, QHS, Newman Pies, MD, 0.5 mg at 01/06/22 2200   alum & mag hydroxide-simeth (MAALOX/MYLANTA) 200-200-20 MG/5ML suspension 30 mL, 30 mL, Oral, Q6H PRN, Newman Pies, MD   amLODipine (NORVASC) tablet 7.5 mg, 7.5 mg, Oral, Daily, Bergman, Meghan D, NP, 7.5 mg at 01/07/22 0940   ARIPiprazole (ABILIFY) tablet 5 mg, 5 mg, Oral, QHS, Newman Pies, MD, 5 mg at 01/06/22 2159   bisacodyl (DULCOLAX) suppository 10 mg, 10 mg, Rectal, Daily PRN, Newman Pies, MD   cyclobenzaprine (FLEXERIL) tablet 10 mg, 10 mg, Oral, TID PRN, Newman Pies, MD, 10 mg at 01/07/22 0953   docusate sodium (COLACE) capsule 100 mg, 100 mg, Oral, BID, Newman Pies, MD, 100 mg at 01/07/22 0942   Eszopiclone 3 mg, 3 mg, Oral, QHS, Pham, Minh Q, RPH-CPP, 3 mg at 01/06/22 2233   ferrous sulfate tablet 325 mg, 325 mg, Oral, Q M,W,F, Newman Pies, MD, 325 mg at 01/07/22 0941   lactated ringers infusion, , Intravenous, Continuous, Newman Pies, MD, Last  Rate: 10 mL/hr at 01/03/22 1427, Rate Change at 01/03/22 1427   lamoTRIgine (LAMICTAL) tablet 200 mg, 200 mg, Oral, Daily, Newman Pies, MD, 200 mg at 01/07/22 0942   lidocaine (LIDODERM) 5 % 1 patch, 1 patch, Transdermal, Q24H, Bergman, Meghan D, NP, 1 patch at 01/07/22 1610   linaclotide (LINZESS) capsule 290 mcg, 290 mcg, Oral, Daily, Newman Pies, MD, 290 mcg at 01/07/22 0809   menthol-cetylpyridinium (CEPACOL) lozenge 3 mg, 1 lozenge, Oral, PRN, 3 mg at 01/02/22 1543 **OR** phenol (CHLORASEPTIC) mouth spray 1 spray, 1 spray, Mouth/Throat, PRN, Newman Pies, MD   morphine (MSIR) tablet 15 mg, 15 mg, Oral, Q4H PRN, Newman Pies, MD, 15 mg at 01/07/22 0953   morphine (PF) 4 MG/ML injection 4 mg, 4 mg, Intravenous, Q2H PRN, Newman Pies, MD, 4 mg at 12/31/21 2310   naloxegol oxalate (  MOVANTIK) tablet 12.5 mg, 12.5 mg, Oral, Daily, Newman Pies, MD, 12.5 mg at 01/07/22 0814   ondansetron (ZOFRAN) tablet 4 mg, 4 mg, Oral, Q6H PRN **OR** ondansetron (ZOFRAN) injection 4 mg, 4 mg, Intravenous, Q6H PRN, Newman Pies, MD, 4 mg at 12/29/21 2205   pantoprazole (PROTONIX) EC tablet 40 mg, 40 mg, Oral, BID, Newman Pies, MD, 40 mg at 01/07/22 0941   pentosan polysulfate (ELMIRON) capsule 200 mg, 200 mg, Oral, BID, Newman Pies, MD, 200 mg at 01/07/22 1009   polyethylene glycol (MIRALAX / GLYCOLAX) packet 17 g, 17 g, Oral, BID, Newman Pies, MD, 17 g at 01/07/22 0810   pregabalin (LYRICA) capsule 150 mg, 150 mg, Oral, BID, Newman Pies, MD, 150 mg at 01/07/22 0941   ursodiol (ACTIGALL) capsule 300 mg, 300 mg, Oral, BID, Newman Pies, MD, 300 mg at 01/07/22 1008   zolpidem (AMBIEN) tablet 5 mg, 5 mg, Oral, QHS PRN, Newman Pies, MD  Patients Current Diet:  Diet Order             Diet - low sodium heart healthy           Diet regular Room service appropriate? Yes; Fluid consistency: Thin  Diet effective now                  Precautions /  Restrictions Precautions Precautions: Cervical, Fall Precaution Booklet Issued: Yes (comment) Precaution Comments: cervical Cervical Brace: Hard collar (can have off in bed) Restrictions Weight Bearing Restrictions: No   Has the patient had 2 or more falls or a fall with injury in the past year? Yes  Prior Activity Level Community (5-7x/wk): indepedent and retired  Prior Functional Level Self Care: Did the patient need help bathing, dressing, using the toilet or eating? Independent  Indoor Mobility: Did the patient need assistance with walking from room to room (with or without device)? Independent  Stairs: Did the patient need assistance with internal or external stairs (with or without device)? Independent  Functional Cognition: Did the patient need help planning regular tasks such as shopping or remembering to take medications? Independent  Patient Information Are you of Hispanic, Latino/a,or Spanish origin?: A. No, not of Hispanic, Latino/a, or Spanish origin What is your race?: A. White Do you need or want an interpreter to communicate with a doctor or health care staff?: 0. No  Patient's Response To:  Health Literacy and Transportation Is the patient able to respond to health literacy and transportation needs?: Yes Health Literacy - How often do you need to have someone help you when you read instructions, pamphlets, or other written material from your doctor or pharmacy?: Never In the past 12 months, has lack of transportation kept you from medical appointments or from getting medications?: No In the past 12 months, has lack of transportation kept you from meetings, work, or from getting things needed for daily living?: No  Home Assistive Devices / [place] Devices/Equipment: Eyeglasses, Sport and exercise psychologist lenses Home Equipment: Conservation officer, nature (2 wheels), [employer] - single point, Civil engineer, contracting - built in, Geneticist, molecular  Prior Device Use: Indicate devices/aids used by the  patient prior to current illness, exacerbation or injury? None of the above  Current Functional Level Cognition  Overall Cognitive Status: Within Functional Limits for tasks assessed Orientation Level: Oriented X4 General Comments: pt able to recall all precautions and apply to mobility    Extremity Assessment (includes Sensation/Coordination)  Upper Extremity Assessment: RUE deficits/detail, LUE deficits/detail RUE Deficits / Details: Pt with 4/5  strength throughout. Continues to be limited with coordination. RUE Sensation: decreased light touch RUE Coordination: decreased fine motor LUE Deficits / Details: Pt continues to do exercises regularly. Pt is limited with function of LUE due to poor sensation. Pt drops items frequently in L hand but is determined to use this hand to improve strength and coordination. LUE Sensation: decreased light touch, decreased proprioception LUE Coordination: decreased fine motor, decreased gross motor  Lower Extremity Assessment: Defer to PT evaluation RLE Deficits / Details: Decreased coordination and proprioception noted. RLE Sensation: decreased proprioception, decreased light touch RLE Coordination: decreased gross motor, decreased fine motor LLE Deficits / Details: Grossly 4/5 at knee and 3/5 at hip. Ataxic movements and decreased coordination during heel to shin test. LLE Sensation: decreased proprioception, decreased light touch LLE Coordination: decreased gross motor, decreased fine motor    ADLs  Overall ADL's : Needs assistance/impaired Eating/Feeding: Supervision/ safety, Set up, Sitting (using red tubing) Eating/Feeding Details (indicate cue type and reason): giving built up tubing to help with self feeding Grooming: Standing, Min guard, Wash/dry hands, Wash/dry face, Oral care Grooming Details (indicate cue type and reason): Pt stood at sink for appx 4 minutes.  HR up to 139 at times. Pt fatigues quickly in standing. Upper Body Bathing:  Minimal assistance, Sitting Lower Body Bathing: Moderate assistance, Sit to/from stand Upper Body Dressing : Moderate assistance, Sitting Lower Body Dressing: Moderate assistance, Sit to/from stand, Cueing for compensatory techniques Lower Body Dressing Details (indicate cue type and reason): Pt instructed to bring legs up to her to donn socks and shoes and to start pants over legs to avoid leaning forward. Pt required assist to use LUE to pull pants over the R leg when in figure 4 position.  Pt required assist in standing to pull pants up. Pt instructed to let go of walker only with one hand at a time due to poor balance. Toilet Transfer: Minimal assistance, Rolling walker (2 wheels), Ambulation, Grab bars Toilet Transfer Details (indicate cue type and reason): Pt used RW to walk to bathroom to toilet.  Assisted requird during turns to sit on toilet due to poor balance. Toileting- Clothing Manipulation and Hygiene: Minimal assistance, Sit to/from stand, Cueing for compensatory techniques Toileting - Clothing Manipulation Details (indicate cue type and reason): Cues given to always hold to walker with one hand while managing clothing to avoid falls. Functional mobility during ADLs: Rolling walker (2 wheels), Minimal assistance General ADL Comments: Pt doing well with hand exercises and static standing tasks.  pt requires more assist with all dynamic standing tasks. Pt fatigues quickly with HR up into 130s.  Needs cues to take rest breaks. Feel rehab continues to be best option for this pt to maximize her independence and work on home skills.    Mobility  Overal bed mobility: Needs Assistance Bed Mobility: Rolling, Sidelying to Sit Rolling: Min guard Sidelying to sit: Min assist Supine to sit: Min assist Sit to sidelying: Mod assist General bed mobility comments: Pt up in chair    Transfers  Overall transfer level: Needs assistance Equipment used: None Transfers: Sit to/from Stand Sit to  Stand: Min assist Bed to/from chair/wheelchair/BSC transfer type:: Stand pivot Stand pivot transfers: Min assist Step pivot transfers: Min assist General transfer comment: minG to rise, slow but stable with initial stand    Ambulation / Gait / Stairs / Wheelchair Mobility  Ambulation/Gait Ambulation/Gait assistance: Min assist, Mod assist Gait Distance (Feet): 100 Feet (+ 5 x 15 ft) Assistive device: None  Gait Pattern/deviations: Step-through pattern, Decreased stride length, Narrow base of support, Ataxic, Drifts right/left, Staggering right General Gait Details: up to modA to steady with balance challenge. at times pt with too much forwards momentum and unable to control. minA to steady. Gait velocity: decreased Gait velocity interpretation: <1.31 ft/sec, indicative of household ambulator    Posture / Balance Balance Overall balance assessment: Needs assistance Sitting-balance support: No upper extremity supported Sitting balance-Leahy Scale: Fair Standing balance support: Bilateral upper extremity supported Standing balance-Leahy Scale: Poor Standing balance comment: UE support and min guard for static standing High level balance activites: Backward walking, Direction changes, Turns, Sudden stops High Level Balance Comments: challenged by short intervals of backwards walking, tandem walking, and full-body turns. increased difficulty, scissoring steps, and LOB with movement to the R    Special needs/care consideration    Previous Home Environment  Living Arrangements: Spouse/significant other  Lives With: Spouse Available Help at Discharge: Family, Available 24 hours/day Type of Home: House Home Layout: Two level, Able to live on main level with bedroom/bathroom Home Access: Level entry Bathroom Shower/Tub: Tub/shower unit, Walk-in shower, Architectural technologist: Handicapped height Bathroom Accessibility: Yes How Accessible: Accessible via wheelchair, Accessible via  [place]: No  Discharge Living Setting Plans for Discharge Living Setting: Patient's home, Lives with (comment) (spouse) Type of Home at Discharge: House Discharge Home Layout: Two level, Able to live on main level with bedroom/bathroom Discharge Home Access: Level entry Discharge Bathroom Shower/Tub: Tub/shower unit, Walk-in shower Discharge Bathroom Toilet: Handicapped height Discharge Bathroom Accessibility: Yes How Accessible: Accessible via wheelchair, Accessible via walker Does the patient have any problems obtaining your medications?: No  Social/Family/Support Systems Patient Roles: Spouse Contact Information: spouse, Shanon Brow Anticipated Caregiver: spouse, Shanon Brow Anticipated Ambulance person Information: see contacts Ability/Limitations of Caregiver: none , he is retired Public relations account executive, works some part time Careers adviser: 24/7 Discharge Plan Discussed with Primary Caregiver: Yes Is Caregiver In Agreement with Plan?: Yes Does Caregiver/Family have Issues with Lodging/Transportation while Pt is in Rehab?: No  Goals Patient/Family Goal for Rehab: Mod I to supervision with PT, supervision to min with OT Expected length of stay: ELOS 10 to 12 days Pt/Family Agrees to Admission and willing to participate: Yes Program Orientation Provided & Reviewed with Pt/Caregiver Including Roles  & Responsibilities: Yes  Decrease burden of Care through IP rehab admission: n/a  Possible need for SNF placement upon discharge: not anticipated  Patient Condition: I have reviewed medical records from [facility], spoken with patient and spouse. I met with patient at the bedside for inpatient rehabilitation assessment.  Patient will benefit from ongoing PT and OT, can actively participate in 3 hours of therapy a day 5 days of the week, and can make measurable gains during the admission.  Patient will also benefit from the coordinated team approach during an Inpatient Acute  Rehabilitation admission.  The patient will receive intensive therapy as well as Rehabilitation physician, nursing, social worker, and care management interventions.  Due to bladder management, bowel management, safety, skin/wound care, disease management, medication administration, pain management, and patient education the patient requires 24 hour a day rehabilitation nursing.  The patient is currently min to mod assist overall with mobility and basic ADLs.  Discharge setting and therapy post discharge at home with home health is anticipated.  Patient has agreed to participate in the Acute Inpatient Rehabilitation Program and will admit today.  Preadmission Screen Completed By:  Cleatrice Burke, 01/07/2022 10:14 AM ______________________________________________________________________   Discussed  status with Dr. Ranell Patrick on 01/07/22 at 1013 and received approval for admission today.  Admission Coordinator:  Cleatrice Burke, RN, time  2355 Date 01/07/22   Assessment/Plan: Diagnosis: Cervical radiculopathy and C6 cervical fracture Does the need for close, 24 hr/day Medical supervision in concert with the patient's rehab needs make it unreasonable for this patient to be served in a less intensive setting? Yes Co-Morbidities requiring supervision/potential complications: chronic insomnia, MDD, [name] syndrome, allergic rhinitis, GERD Due to bladder management, bowel management, safety, skin/wound care, disease management, medication administration, pain management, and patient education, does the patient require 24 hr/day rehab nursing? Yes Does the patient require coordinated care of a physician, rehab nurse, PT, OT to address physical and functional deficits in the context of the above medical diagnosis(es)? Yes Addressing deficits in the following areas: balance, endurance, locomotion, strength, transferring, bowel/bladder control, bathing, dressing, feeding, grooming, toileting, and  psychosocial support Can the patient actively participate in an intensive therapy program of at least 3 hrs of therapy 5 days a week? Yes The potential for patient to make measurable gains while on inpatient rehab is excellent Anticipated functional outcomes upon discharge from inpatient rehab: modified independent PT, modified independent OT, independent SLP Estimated rehab length of stay to reach the above functional goals is: 7 days Anticipated discharge destination: Home 10. Overall Rehab/Functional Prognosis: excellent   MD Signature: Leeroy Cha, MD

## 2022-01-04 NOTE — Progress Notes (Signed)
Physical Therapy Treatment Patient Details Name: Rachel Duran MRN: [id number] DOB: Apr 05, 1956 Today's Date: 01/04/2022   History of Present Illness 66 year old female s/p 3-stage anterior/posterior fusion (C5-6 corpectomy, post fusion C3-7; ACDF C3-7) with post op weakness (L weaker than R). Postop MRI small amount of epidural fluid at C5-6. Pt is s/p revision on 6/8 for T1 pedicle screw.  PMH: previous C5-6 and C6-7 anterior discectomy and interbody fusion in 2004; C3-4 and C4-5 anterior cervical discectomy fusion plating 2017;  fall on 10/29/2021 suffering a C5-6 fracture with persistent neck pain; R TKR; Bipolar; fibromyalgia, scleroderma; arthritis, depression, DDD, tuberculosis, L THA.    PT Comments    Pt making steady progress with mobility and continues to be motivated to work toward independence. Pt's gait continues to have ataxic component and requiring min to mod assist. Continue to feel pt is excellent candidate for acute inpatient rehab (AIR) for post-acute therapy needs.   Recommendations for follow up therapy are one component of a multi-disciplinary discharge planning process, led by the attending physician.  Recommendations may be updated based on patient status, additional functional criteria and insurance authorization.  Follow Up Recommendations  Acute inpatient rehab (3hours/day)     Assistance Recommended at Discharge Frequent or constant Supervision/Assistance  Patient can return home with the following Assistance with cooking/housework;Help with stairs or ramp for entrance;Assist for transportation;A lot of help with walking and/or transfers   Equipment Recommendations  None recommended by PT    Recommendations for Other Services       Precautions / Restrictions Precautions Precautions: Cervical;Fall Precaution Booklet Issued: Yes (comment) Precaution Comments: cervical Required Braces or Orthoses: Cervical Brace Cervical Brace: Hard collar (can have off  in bed) Restrictions Weight Bearing Restrictions: No     Mobility  Bed Mobility               General bed mobility comments: Pt up in chair    Transfers Overall transfer level: Needs assistance Equipment used: Rolling walker (2 wheels), None Transfers: Sit to/from Stand Sit to Stand: Min assist           General transfer comment: Assist to stabilize as rising    Ambulation/Gait Ambulation/Gait assistance: Min assist, Mod assist Gait Distance (Feet): 100 Feet (100' x 1, 60' x 1) Assistive device: Rolling walker (2 wheels) (bilateral shelf arm support) Gait Pattern/deviations: Step-through pattern, Decreased stride length, Narrow base of support, Ataxic, Drifts right/left Gait velocity: decreased Gait velocity interpretation: <1.8 ft/sec, indicate of risk for recurrent falls   General Gait Details: Min assist for balance with episodes of instability due to ataxia requiring mod assist. Verbal cues to increase width of base of support. Short distance (5') forward and backward stepping with bilateral forearm support requiring min to mod assist for balance.   Stairs             Wheelchair Mobility    Modified Rankin (Stroke Patients Only)       Balance Overall balance assessment: Needs assistance Sitting-balance support: No upper extremity supported Sitting balance-Leahy Scale: Fair     Standing balance support: Bilateral upper extremity supported Standing balance-Leahy Scale: Poor Standing balance comment: UE support and min guard for static standing             High level balance activites: Side stepping, Backward walking High Level Balance Comments: Performed side stepping, backward walking with bilateral forearm support with min to mod assist            Cognition  Arousal/Alertness: Awake/alert Behavior During Therapy: WFL for tasks assessed/performed Overall Cognitive Status: Within Functional Limits for tasks assessed                                           Exercises      General Comments        Pertinent Vitals/Pain Pain Assessment Pain Assessment: Faces Faces Pain Scale: Hurts a little bit Pain Location: scapular pain Pain Descriptors / Indicators: Aching, Grimacing Pain Intervention(s): Limited activity within patient's tolerance, Repositioned    Home Living                          Prior Function            PT Goals (current goals can now be found in the care plan section) Acute Rehab PT Goals Patient Stated Goal: to get stronger and be independent Progress towards PT goals: Progressing toward goals    Frequency    Min 5X/week      PT Plan Current plan remains appropriate    Co-evaluation              AM-PAC PT "6 Clicks" Mobility   Outcome Measure  Help needed turning from your back to your side while in a flat bed without using bedrails?: A Little Help needed moving from lying on your back to sitting on the side of a flat bed without using bedrails?: A Little Help needed moving to and from a bed to a chair (including a wheelchair)?: A Little Help needed standing up from a chair using your arms (e.g., wheelchair or bedside chair)?: A Little Help needed to walk in hospital room?: A Lot Help needed climbing 3-5 steps with a railing? : Total 6 Click Score: 15    End of Session Equipment Utilized During Treatment: Gait belt;Cervical collar Activity Tolerance: Patient tolerated treatment well Patient left: in chair;with call bell/phone within reach;with chair alarm set   PT Visit Diagnosis: Unsteadiness on feet (R26.81);Muscle weakness (generalized) (M62.81);Ataxic gait (R26.0)     Time: 1340-1400 PT Time Calculation (min) (ACUTE ONLY): 20 min  Charges:  $Gait Training: 8-22 mins                     [place] [facility] [place] 01/04/2022, 4:32 PM

## 2022-01-04 NOTE — Progress Notes (Signed)
Inpatient Rehabilitation Admissions Coordinator   Received therapy updates after 530. I will begin appeal this morning.  Danne Baxter, RN, MSN Rehab Admissions Coordinator [phone] 01/04/2022 7:58 AM

## 2022-01-05 NOTE — Progress Notes (Signed)
Subjective: The patient is alert and pleasant.  She looks well.  She is in no apparent distress.  She has been working with PT and OT.  She is awaiting rehab placement.  Objective: Vital signs in last 24 hours: Temp:  [98.1 F (36.7 C)-99.2 F (37.3 C)] 98.3 F (36.8 C) (06/14 0748) Pulse Rate:  [90-103] 96 (06/14 0748) Resp:  [17-20] 20 (06/14 0748) BP: (136-141)/(81-88) 136/87 (06/14 0748) SpO2:  [96 %-99 %] 98 % (06/14 0748) Estimated body mass index is 23.39 kg/m as calculated from the following:   Height as of this encounter: '5\' 3"'$  (1.6 m).   Weight as of this encounter: 59.9 kg.   Intake/Output from previous day: 06/13 0701 - 06/14 0700 In: 220 [P.O.:220] Out: -  Intake/Output this shift: Total I/O In: 300 [P.O.:300] Out: -   Physical exam the patient is alert and pleasant.  Her anterior and posterior incisions look good.   Lab Results: No results for input(s): "WBC", "HGB", "HCT", "PLT" in the last 72 hours. BMET No results for input(s): "NA", "K", "CL", "CO2", "GLUCOSE", "BUN", "CREATININE", "CALCIUM" in the last 72 hours.  Studies/Results: No results found.  Assessment/Plan: Postop day #7: The patient is doing well.  I think she would benefit from inpatient rehab.  We are awaiting insurance approval.  LOS: 7 days     [name] 01/05/2022, 9:48 AM     Patient ID: Rachel Duran, female   DOB: April 11, 1956, 66 y.o.   MRN: [id number]

## 2022-01-05 NOTE — Progress Notes (Signed)
Physical Therapy Treatment Patient Details Name: Rachel Duran MRN: [id number] DOB: 1956-02-18 Today's Date: 01/05/2022   History of Present Illness 66 year old female s/p 3-stage anterior/posterior fusion (C5-6 corpectomy, post fusion C3-7; ACDF C3-7) with post op weakness (L weaker than R). Postop MRI small amount of epidural fluid at C5-6. Pt is s/p revision on 6/8 for T1 pedicle screw.  PMH: previous C5-6 and C6-7 anterior discectomy and interbody fusion in 2004; C3-4 and C4-5 anterior cervical discectomy fusion plating 2017;  fall on 10/29/2021 suffering a C5-6 fracture with persistent neck pain; R TKR; Bipolar; fibromyalgia, scleroderma; arthritis, depression, DDD, tuberculosis, L THA.    PT Comments    The pt was eager to mobilize and continues to make good progress with gait and mobility this session. Continues to present with deficits in coordination and mild ataxia in gait that impact stability and require up to modA to steady and recover from LOB with added challenges such as backwards walking, tandem walking, sudden turns, and prolonged SLS. The pt was able to complete session without use of RW, but will benefit from acute inpatient rehab to maximize functional recovery and allow for safe return home without being dependent on AD or family assist.     Recommendations for follow up therapy are one component of a multi-disciplinary discharge planning process, led by the attending physician.  Recommendations may be updated based on patient status, additional functional criteria and insurance authorization.  Follow Up Recommendations  Acute inpatient rehab (3hours/day)     Assistance Recommended at Discharge Frequent or constant Supervision/Assistance  Patient can return home with the following Assistance with cooking/housework;Help with stairs or ramp for entrance;Assist for transportation;A lot of help with walking and/or transfers   Equipment Recommendations  None recommended by  PT    Recommendations for Other Services       Precautions / Restrictions Precautions Precautions: Cervical;Fall Precaution Booklet Issued: Yes (comment) Precaution Comments: cervical Required Braces or Orthoses: Cervical Brace Cervical Brace: Hard collar (can have off in bed) Restrictions Weight Bearing Restrictions: No     Mobility  Bed Mobility               General bed mobility comments: Pt up in chair    Transfers Overall transfer level: Needs assistance Equipment used: None Transfers: Sit to/from Stand Sit to Stand: Min assist, Min guard           General transfer comment: minA at times to steady, no overt LOB, intermittent use of UE    Ambulation/Gait Ambulation/Gait assistance: Min assist, Mod assist Gait Distance (Feet): 45 Feet (4 x 15 ft + 30 ft) Assistive device: None Gait Pattern/deviations: Step-through pattern, Decreased stride length, Narrow base of support, Ataxic, Drifts right/left Gait velocity: decreased Gait velocity interpretation: <1.31 ft/sec, indicative of household ambulator   General Gait Details: up to [place] for balance with removal of UE support. Pt challenged by tandem walking and backwards walking. Needing at least single UE support to steady as well as min-modA . minA with gait without challenge     Balance Overall balance assessment: Needs assistance Sitting-balance support: No upper extremity supported Sitting balance-Leahy Scale: Fair     Standing balance support: Bilateral upper extremity supported Standing balance-Leahy Scale: Poor Standing balance comment: UE support and min guard for static standing             High level balance activites: Backward walking, Turns, Other (comment) (tandem walking) High Level Balance Comments: challenged by short intervals of tandem  walking, backwards walking and forwards walking with quick turns to either side. up to [place] with challenge, mod unsteady            Cognition  Arousal/Alertness: Awake/alert Behavior During Therapy: WFL for tasks assessed/performed Overall Cognitive Status: Within Functional Limits for tasks assessed                                          Exercises Other Exercises Other Exercises: toe taps to 10" surface with single UE support x10 each leg and without UE support x10 each leg    General Comments General comments (skin integrity, edema, etc.): HR to 128 bpm      Pertinent Vitals/Pain Pain Assessment Pain Assessment: No/denies pain Pain Intervention(s): Monitored during session     PT Goals (current goals can now be found in the care plan section) Acute Rehab PT Goals Patient Stated Goal: to get stronger and be independent PT Goal Formulation: With patient Time For Goal Achievement: 01/14/22 Potential to Achieve Goals: Good Progress towards PT goals: Progressing toward goals    Frequency    Min 5X/week      PT Plan Current plan remains appropriate       AM-PAC PT "6 Clicks" Mobility   Outcome Measure  Help needed turning from your back to your side while in a flat bed without using bedrails?: A Little Help needed moving from lying on your back to sitting on the side of a flat bed without using bedrails?: A Little Help needed moving to and from a bed to a chair (including a wheelchair)?: A Little Help needed standing up from a chair using your arms (e.g., wheelchair or bedside chair)?: A Little Help needed to walk in hospital room?: A Lot Help needed climbing 3-5 steps with a railing? : Total 6 Click Score: 15    End of Session Equipment Utilized During Treatment: Gait belt;Cervical collar Activity Tolerance: Patient tolerated treatment well Patient left: in chair;with call bell/phone within reach;with chair alarm set Nurse Communication: Mobility status PT Visit Diagnosis: Unsteadiness on feet (R26.81);Muscle weakness (generalized) (M62.81);Ataxic gait (R26.0)     Time:  5462-7035 PT Time Calculation (min) (ACUTE ONLY): 28 min  Charges:  $Gait Training: 8-22 mins $Therapeutic Exercise: 8-22 mins                     [place], PT, DPT   Acute Rehabilitation Department   Sandra Cockayne 01/05/2022, 9:40 AM

## 2022-01-06 ENCOUNTER — Encounter (HOSPITAL_COMMUNITY): Payer: Self-pay | Admitting: Neurosurgery

## 2022-01-06 MED ORDER — MORPHINE SULFATE 15 MG PO TABS
15.0000 mg | ORAL_TABLET | ORAL | Status: DC | PRN
  Administered 2022-01-06 – 2022-01-07 (×3): 15 mg via ORAL
  Filled 2022-01-06 (×3): qty 1

## 2022-01-06 NOTE — Progress Notes (Signed)
Inpatient Rehabilitation Admissions Coordinator   CIR approved on appeal with [facility] medicare. I met with patient at bedside and she is aware. Hopeful for a bed Friday or Saturday to admit her. I will follow up in the morning. I met with patient at bedside and she is aware.  Danne Baxter, RN, MSN Rehab Admissions Coordinator [phone] 01/06/2022 3:18 PM

## 2022-01-06 NOTE — Progress Notes (Signed)
Occupational Therapy Treatment Patient Details Name: Oriana Horiuchi MRN: [id number] DOB: [birth year] Today's Date: 01/06/2022   History of present illness 66 year old female s/p 3-stage anterior/posterior fusion (C5-6 corpectomy, post fusion C3-7; ACDF C3-7) with post op weakness (L weaker than R). Postop MRI small amount of epidural fluid at C5-6. Pt is s/p revision on 6/8 for T1 pedicle screw.  PMH: previous C5-6 and C6-7 anterior discectomy and interbody fusion in 2004; C3-4 and C4-5 anterior cervical discectomy fusion plating 2017;  fall on 10/29/2021 suffering a C5-6 fracture with persistent neck pain; R TKR; Bipolar; fibromyalgia, scleroderma; arthritis, depression, DDD, tuberculosis, L THA.   OT comments  Pt continues to work hard completing most adls with min to mod assist at this point. Pt remains unsteady on feet requiring cues to not let go of the walker with both hands at the same time.  Instructed pt on LE dressing techniques focusing on fall prevention. Pt with HR up to 139 at times requiring cues to take breaks.  Will talk to PT about possiblity of a rollator?  Pt requires frequent rest breaks due to this HR during session and rollator may provide this but do not want this time of device to get away from her when she walks.  Will continue to see acutely and recommend acute inpatient rehab to maximize this pts independence and decrease pt's potential fall risk.    Recommendations for follow up therapy are one component of a multi-disciplinary discharge planning process, led by the attending physician.  Recommendations may be updated based on patient status, additional functional criteria and insurance authorization.    Follow Up Recommendations  Acute inpatient rehab (3hours/day)    Assistance Recommended at Discharge Frequent or constant Supervision/Assistance  Patient can return home with the following  A lot of help with walking and/or transfers;A lot of help with  bathing/dressing/bathroom;Assistance with cooking/housework;Assist for transportation;Help with stairs or ramp for entrance   Equipment Recommendations  BSC/3in1;Wheelchair (measurements OT);Wheelchair cushion (measurements OT)    Recommendations for Other Services      Precautions / Restrictions Precautions Precautions: Cervical;Fall Precaution Comments: cervical Required Braces or Orthoses: Cervical Brace Cervical Brace: Hard collar Restrictions Weight Bearing Restrictions: No       Mobility Bed Mobility               General bed mobility comments: Pt up in chair    Transfers Overall transfer level: Needs assistance Equipment used: Rolling walker (2 wheels) Transfers: Sit to/from Stand Sit to Stand: Min guard Stand pivot transfers: Min assist   Step pivot transfers: Min assist     General transfer comment: min assist needed frequently to steady self.     Balance Overall balance assessment: Needs assistance Sitting-balance support: No upper extremity supported Sitting balance-Leahy Scale: Fair     Standing balance support: Bilateral upper extremity supported Standing balance-Leahy Scale: Poor Standing balance comment: Pt is reliant on outside support to complete dynamic standing tasks.  Pt has walked some without walker but needs up to mod assist to recover from LOB at times.  When using walker, pt requires up to min assist to maintain balance. Pt remains a fall risk at this point. Feel rehab will equip pt with skills to avoid falls and become stronger to tolerate safer ambulation.                           ADL either performed or assessed with clinical judgement  ADL Overall ADL's : Needs assistance/impaired     Grooming: Standing;Min guard;Wash/dry hands;Wash/dry face;Oral care Grooming Details (indicate cue type and reason): Pt stood at sink for appx 4 minutes.  HR up to 139 at times. Pt fatigues quickly in standing.             Lower  Body Dressing: Moderate assistance;Sit to/from stand;Cueing for compensatory techniques Lower Body Dressing Details (indicate cue type and reason): Pt instructed to bring legs up to her to donn socks and shoes and to start pants over legs to avoid leaning forward. Pt required assist to use LUE to pull pants over the R leg when in figure 4 position.  Pt required assist in standing to pull pants up. Pt instructed to let go of walker only with one hand at a time due to poor balance. Toilet Transfer: Minimal assistance;Rolling walker (2 wheels);Ambulation;Grab bars Toilet Transfer Details (indicate cue type and reason): Pt used RW to walk to bathroom to toilet.  Assisted requird during turns to sit on toilet due to poor balance. Toileting- Clothing Manipulation and Hygiene: Minimal assistance;Sit to/from stand;Cueing for compensatory techniques Toileting - Clothing Manipulation Details (indicate cue type and reason): Cues given to always hold to walker with one hand while managing clothing to avoid falls.     Functional mobility during ADLs: Rolling walker (2 wheels);Minimal assistance General ADL Comments: Pt doing well with hand exercises and static standing tasks.  pt requires more assist with all dynamic standing tasks. Pt fatigues quickly with HR up into 130s.  Needs cues to take rest breaks. Feel rehab continues to be best option for this pt to maximize her independence and work on home skills.    Extremity/Trunk Assessment Upper Extremity Assessment Upper Extremity Assessment: RUE deficits/detail;LUE deficits/detail RUE Deficits / Details: Pt with 4/5 strength throughout. Continues to be limited with coordination. RUE Sensation: decreased light touch RUE Coordination: decreased fine motor LUE Deficits / Details: Pt continues to do exercises regularly. Pt is limited with function of LUE due to poor sensation. Pt drops items frequently in L hand but is determined to use this hand to improve  strength and coordination. LUE Sensation: decreased light touch;decreased proprioception LUE Coordination: decreased fine motor;decreased gross motor   Lower Extremity Assessment Lower Extremity Assessment: Defer to PT evaluation        Vision   Vision Assessment?: No apparent visual deficits   Perception     Praxis Praxis Praxis: Intact    Cognition Arousal/Alertness: Awake/alert Behavior During Therapy: WFL for tasks assessed/performed Overall Cognitive Status: Within Functional Limits for tasks assessed                                 General Comments: Pt following all precautions and asking great questions regarding easiest ways to get into and out of bed.        Exercises      Shoulder Instructions       General Comments Pt continues to require assist with balance when on feet completing adls and assist with LUE due to weakness and poor sensation.    Pertinent Vitals/ Pain       Pain Assessment Pain Assessment: 0-10 Pain Score: 2  Pain Location: scapular pain Pain Descriptors / Indicators: Aching Pain Intervention(s): Monitored during session, Repositioned, Premedicated before session  Home Living  Prior Functioning/Environment              Frequency  Min 3X/week        Progress Toward Goals  OT Goals(current goals can now be found in the care plan section)  Progress towards OT goals: Progressing toward goals  Acute Rehab OT Goals Patient Stated Goal: to get as independent as I can so I dont fall OT Goal Formulation: With patient Time For Goal Achievement: 01/13/22 Potential to Achieve Goals: Good ADL Goals Pt Will Perform Upper Body Bathing: with set-up;with supervision;sitting Pt Will Perform Lower Body Bathing: with min guard assist;sitting/lateral leans;sit to/from stand Pt Will Perform Upper Body Dressing: with set-up;with supervision;sitting Pt Will Perform  Lower Body Dressing: with min assist;sitting/lateral leans;sit to/from stand Pt Will Transfer to Toilet: stand pivot transfer;with min assist;bedside commode Pt Will Perform Toileting - Clothing Manipulation and hygiene: with supervision;with set-up;sitting/lateral leans Pt/caregiver will Perform Home Exercise Program: Increased strength;Increased ROM;Right Upper extremity;Left upper extremity;With theraputty;With written HEP provided;Independently Additional ADL Goal #1: Pt will independently verbalize 3 cervical precautions  Plan Discharge plan remains appropriate    Co-evaluation                 AM-PAC OT "6 Clicks" Daily Activity     Outcome Measure   Help from another person eating meals?: A Little Help from another person taking care of personal grooming?: A Little Help from another person toileting, which includes using toliet, bedpan, or urinal?: A Lot Help from another person bathing (including washing, rinsing, drying)?: A Lot Help from another person to put on and taking off regular upper body clothing?: A Little Help from another person to put on and taking off regular lower body clothing?: A Little 6 Click Score: 16    End of Session Equipment Utilized During Treatment: Rolling walker (2 wheels);Gait belt  OT Visit Diagnosis: Unsteadiness on feet (R26.81);Other abnormalities of gait and mobility (R26.89);Muscle weakness (generalized) (M62.81);History of falling (Z91.81);Ataxia, unspecified (R27.0);Apraxia (R48.2);Pain   Activity Tolerance Patient tolerated treatment well   Patient Left in chair;with call bell/phone within reach;with chair alarm set   Nurse Communication Mobility status        Time: 5852-7782 OT Time Calculation (min): 38 min  Charges: OT General Charges $OT Visit: 1 Visit OT Treatments $Self Care/Home Management : 38-52 mins   Glenford Peers 01/06/2022, 12:10 PM

## 2022-01-06 NOTE — Progress Notes (Signed)
Subjective: The patient is alert and pleasant.  She tells me she is intolerant to oxycodone and requested her oral morphine which she has taken chronically.    Objective: Vital signs in last 24 hours: Temp:  [98.1 F (36.7 C)-98.9 F (37.2 C)] 98.1 F (36.7 C) (06/15 0728) Pulse Rate:  [84-100] 96 (06/15 0728) Resp:  [16-20] 17 (06/15 0728) BP: (108-136)/(66-87) 131/85 (06/15 0728) SpO2:  [94 %-99 %] 94 % (06/15 0728) Estimated body mass index is 23.39 kg/m as calculated from the following:   Height as of this encounter: '5\' 3"'$  (1.6 m).   Weight as of this encounter: 59.9 kg.   Intake/Output from previous day: 06/14 0701 - 06/15 0700 In: 300 [P.O.:300] Out: -  Intake/Output this shift: No intake/output data recorded.  Physical exam the patient is alert and pleasant.  She ambulates unsteadily with a walker.  Her anterior and posterior cervical incisions are well healing well.  She is moving all 4 extremities.  Lab Results: No results for input(s): "WBC", "HGB", "HCT", "PLT" in the last 72 hours. BMET No results for input(s): "NA", "K", "CL", "CO2", "GLUCOSE", "BUN", "CREATININE", "CALCIUM" in the last 72 hours.  Studies/Results: No results found.  Assessment/Plan: Postop day #8: I will change her over to oral morphine as requested.  We are awaiting rehab placement.  LOS: 8 days     [name] 01/06/2022, 7:41 AM     Patient ID: Rachel Duran, female   DOB: [birth year], 66 y.o.   MRN: [id number]

## 2022-01-06 NOTE — Progress Notes (Signed)
Physical Therapy Treatment Patient Details Name: Rachel Duran MRN: [id number] DOB: 03/14/1956 Today's Date: 01/06/2022   History of Present Illness 66 year old female s/p 3-stage anterior/posterior fusion (C5-6 corpectomy, post fusion C3-7; ACDF C3-7) with post op weakness (L weaker than R). Postop MRI small amount of epidural fluid at C5-6. Pt is s/p revision on 6/8 for T1 pedicle screw.  PMH: previous C5-6 and C6-7 anterior discectomy and interbody fusion in 2004; C3-4 and C4-5 anterior cervical discectomy fusion plating 2017;  fall on 10/29/2021 suffering a C5-6 fracture with persistent neck pain; R TKR; Bipolar; fibromyalgia, scleroderma; arthritis, depression, DDD, tuberculosis, L THA.    PT Comments    The pt was eager to progress with mobility, continues to present with mild ataxia and incoordination with ambulation, needing up to minA to steady without use of DME and up to modA to correct LOB with addition of balance challenges such as backwards walking, walking with narrow BOS, and changes in direction. Continue to recommend acute inpatient rehab at d/c.    Recommendations for follow up therapy are one component of a multi-disciplinary discharge planning process, led by the attending physician.  Recommendations may be updated based on patient status, additional functional criteria and insurance authorization.  Follow Up Recommendations  Acute inpatient rehab (3hours/day)     Assistance Recommended at Discharge Frequent or constant Supervision/Assistance  Patient can return home with the following Assistance with cooking/housework;Help with stairs or ramp for entrance;Assist for transportation;A lot of help with walking and/or transfers   Equipment Recommendations  None recommended by PT    Recommendations for Other Services       Precautions / Restrictions Precautions Precautions: Cervical;Fall Precaution Booklet Issued: Yes (comment) Precaution Comments:  cervical Required Braces or Orthoses: Cervical Brace Cervical Brace: Hard collar (can have off in bed) Restrictions Weight Bearing Restrictions: No     Mobility  Bed Mobility               General bed mobility comments: Pt up in chair    Transfers Overall transfer level: Needs assistance Equipment used: None Transfers: Sit to/from Stand Sit to Stand: Min assist           General transfer comment: minG to rise, slow but stable with initial stand    Ambulation/Gait Ambulation/Gait assistance: Min assist, Mod assist Gait Distance (Feet): 100 Feet (+ 5 x 15 ft) Assistive device: None Gait Pattern/deviations: Step-through pattern, Decreased stride length, Narrow base of support, Ataxic, Drifts right/left, Staggering right Gait velocity: decreased     General Gait Details: up to modA to steady with balance challenge. at times pt with too much forwards momentum and unable to control. minA to steady.      Balance Overall balance assessment: Needs assistance Sitting-balance support: No upper extremity supported Sitting balance-Leahy Scale: Fair     Standing balance support: Bilateral upper extremity supported Standing balance-Leahy Scale: Poor Standing balance comment: UE support and min guard for static standing             High level balance activites: Backward walking, Direction changes, Turns, Sudden stops High Level Balance Comments: challenged by short intervals of backwards walking, tandem walking, and full-body turns. increased difficulty, scissoring steps, and LOB with movement to the R            Cognition Arousal/Alertness: Awake/alert Behavior During Therapy: [facility] for tasks assessed/performed Overall Cognitive Status: Within Functional Limits for tasks assessed  General Comments: pt able to recall all precautions and apply to mobility        Exercises      General Comments General comments  (skin integrity, edema, etc.): VSS HR 114-122bpm      Pertinent Vitals/Pain Pain Assessment Pain Assessment: No/denies pain Pain Intervention(s): Premedicated before session     PT Goals (current goals can now be found in the care plan section) Acute Rehab PT Goals Patient Stated Goal: to get stronger and be independent PT Goal Formulation: With patient Time For Goal Achievement: 01/14/22 Potential to Achieve Goals: Good Progress towards PT goals: Progressing toward goals    Frequency    Min 5X/week      PT Plan Current plan remains appropriate       AM-PAC PT "6 Clicks" Mobility   Outcome Measure  Help needed turning from your back to your side while in a flat bed without using bedrails?: A Little Help needed moving from lying on your back to sitting on the side of a flat bed without using bedrails?: A Little Help needed moving to and from a bed to a chair (including a wheelchair)?: A Little Help needed standing up from a chair using your arms (e.g., wheelchair or bedside chair)?: A Little Help needed to walk in hospital room?: A Lot Help needed climbing 3-5 steps with a railing? : Total 6 Click Score: 15    End of Session Equipment Utilized During Treatment: Gait belt;Cervical collar Activity Tolerance: Patient tolerated treatment well Patient left: in chair;with call bell/phone within reach;with chair alarm set Nurse Communication: Mobility status PT Visit Diagnosis: Unsteadiness on feet (R26.81);Muscle weakness (generalized) (M62.81);Ataxic gait (R26.0)     Time: 7939-0300 PT Time Calculation (min) (ACUTE ONLY): 20 min  Charges:  $Gait Training: 8-22 mins                     West Carbo, PT, DPT   Acute Rehabilitation Department   Sandra Cockayne 01/06/2022, 6:00 PM

## 2022-01-06 NOTE — Progress Notes (Signed)
Inpatient Rehabilitation Admissions Coordinator   I continue to await insurance appeal for CIR admit.  Danne Baxter, RN, MSN Rehab Admissions Coordinator [phone] 01/06/2022 11:53 AM

## 2022-01-06 NOTE — H&P (Signed)
Physical Medicine and Rehabilitation Admission H&P   HPI: Rachel Duran is a 66 year old right-handed female history of chronic anemia, bipolar disorder/depression/fibromyalgia, ACDF 2004 C5-6 and C6-7 and 2017 C3-4 and C4-5, left total hip arthroplasty 2020, scleroderma.  Per chart review patient lives with spouse.  Two-level home bed and bath on main level with level entry.  Independent prior to admission and driving.  Presented 12/29/2021 with noted history of fall 10/29/2021 suffering a C5-6 fracture subluxation/cervical kyphosis with persistent neck pain and initial conservative care with failed medical management.  Underwent 3 stage anterior posterior fusion C5-6 corpectomy, posterior fusion C3-7, ACDF C3-7 12/29/2021.  Postop she developed left hand weakness consistent with a C8 radiculopathy as well as small amount of epidural fluid at C5-6.  Cervical CT demonstrated that the left T1 pedicle screw was a bit low and into the neural foramen.  Patient underwent revision of posterior cervical instrumentation 12/30/2021 per Dr. Newman Pies.  Placed in a cervical hard collar when out of bed.  Hospital course pain management.  Acute blood loss anemia 9.8 and monitored.  Tolerating a regular consistency diet.  Therapy evaluations completed due to patient decreased functional mobility was admitted for a comprehensive rehab program. Currently complains of left hand weakness.   Review of Systems  Constitutional:  Negative for chills and fever.  HENT:  Negative for hearing loss.   Eyes:  Negative for blurred vision and double vision.  Respiratory:  Negative for cough.   Cardiovascular:  Negative for chest pain, palpitations and leg swelling.  Gastrointestinal:  Positive for constipation. Negative for heartburn, nausea and vomiting.       GERD  Genitourinary:  Positive for urgency. Negative for dysuria, flank pain and hematuria.  Musculoskeletal:  Positive for joint pain, myalgias and neck pain.   Skin:  Negative for rash.  Neurological:  Positive for headaches.  Psychiatric/Behavioral:  Positive for depression.        Anxiety/bipolar disorder  All other systems reviewed and are negative.  Past Medical History:  Diagnosis Date  . Abdominal pain last 4 months   and nausea also  . Allergy   . Anemia    history of  . Anxiety   . Bipolar disorder ([place])    atpical bipolar disorder  . 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 C6- C7 -hx. past fusion(bone graft used)  . Cholecystitis   . Chronic pain   . DDD (degenerative disc disease), lumbar 09/2015   dextroscoliosis with multilevel DDD and facet arthrosis most notable for R foraminal disc protrusion L4/5 producing severe R neural foraminal stenosis abutting R L4 nerve root, moderate spinal canal and mild lat recesss and R neural foraminal stenosis L3/4 (MRI)  . Depression    bipolar depression  . Disorders of porphyrin metabolism   . Felon of finger of left hand 11/10/2016  . Fibromyalgia   . GERD (gastroesophageal reflux disease)   . Headache    occasionally  . Internal hemorrhoids   . Interstitial cystitis 06/06/2012   hx.  . Irritable bowel syndrome   . PONV (postoperative nausea and vomiting)    now uses stomach blockers and no ponv  . 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 Raynauds disease    hx.  . Scleroderma ([place]) 04/24/2020  .  Seronegative arthritis    [name]  . 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 Tuberculosis    Past Surgical History:  Procedure Laterality Date  . ANTERIOR CERVICAL DECOMP/DISCECTOMY FUSION  2004   C5/6, C6/7  . ANTERIOR CERVICAL DECOMP/DISCECTOMY FUSION  02/2016   C3/4, C4/5 with plating [name])  . ANTERIOR CERVICAL DECOMP/DISCECTOMY FUSION  12/29/2021   Procedure:  ANTERIOR CERVICAL DECOMPRESSION/ DISCECTOMY FUSION CERVICAL THREE - SEVEN;  Surgeon: Newman Pies, MD;  Location: [facility];  Service: Neurosurgery;;  . ANTERIOR CERVICAL DECOMPRESSION/DISCECTOMY FUSION 4 LEVELS N/A 12/29/2021   Procedure: CERVICAL FIVE-SIX CORPECTOMY,;  Surgeon: Newman Pies, MD;  Location: [facility];  Service: Neurosurgery;  Laterality: N/A;  . AUGMENTATION MAMMAPLASTY Bilateral 03/25/2010  . BREAST ENHANCEMENT SURGERY  2010  . BREAST IMPLANT EXCHANGE  10/2014   exchange saline implants, B mastopexy/capsulorraphy ([facility])  . BUNIONECTOMY Bilateral yrs ago  . [place]   x 1  . CHOLECYSTECTOMY  06/11/2012   Procedure: LAPAROSCOPIC CHOLECYSTECTOMY WITH INTRAOPERATIVE CHOLANGIOGRAM;  Surgeon: [name], MD,FACS;  Location: [facility];  Service: General;  Laterality: N/A;  . COLONOSCOPY  02/2018   done for positive cologuard - WNL, rpt 10 yrs Fuller Plan)  . CYSTOSCOPY    . ERCP  05/22/2012   Procedure: ENDOSCOPIC RETROGRADE CHOLANGIOPANCREATOGRAPHY (ERCP);  Surgeon: [name], MD,FACG;  Location: [facility];  Service: Endoscopy;  Laterality: N/A;  . ERCP N/A 09/17/2013   Procedure: ENDOSCOPIC RETROGRADE CHOLANGIOPANCREATOGRAPHY (ERCP);  Surgeon: [name], MD;  Location: [facility];  Service: Endoscopy;  Laterality: N/A;  . ERCP N/A 09/27/2018   Procedure: ENDOSCOPIC RETROGRADE CHOLANGIOPANCREATOGRAPHY (ERCP);  Surgeon: [name], MD;  Location: [facility];  Service: Endoscopy;  Laterality: N/A;  . ESOPHAGOGASTRODUODENOSCOPY  09/2016   WNL. esophagus dilated [facility] Plan)  . HEMORRHOID BANDING  09/23/2013   --Dr. Greer Pickerel  . HERNIA REPAIR     inguinal  . HYSTEROSCOPY W/ ENDOMETRIAL ABLATION    . KNEE ARTHROSCOPY Right 04/06/2021   Procedure: RIGHT KNEE ARTHROSCOPY WITH DEBRIDEMENT;  Surgeon: [name], MD;  Location: [place];  Service: Orthopedics;  Laterality: Right;  . NASAL SINUS SURGERY     x5  .  PARTIAL KNEE ARTHROPLASTY Right 06/01/2020   Procedure: Right knee patellofemoral replacement;  Surgeon: [name], MD;  Location: [facility];  Service: Orthopedics;  Laterality: Right;  . POSTERIOR CERVICAL FUSION/FORAMINOTOMY N/A 12/29/2021   Procedure: POSTERIOR CERVICAL FUSION CERVICAL THREE-SEVEN;  Surgeon: Newman Pies, MD;  Location: [facility];  Service: Neurosurgery;  Laterality: N/A;  . REMOVAL OF STONES  09/27/2018   Procedure: REMOVAL OF STONES;  Surgeon: [name], MD;  Location: [facility];  Service: Endoscopy;;  . [name]  09/27/2018   Procedure: SPHINCTEROTOMY;  Surgeon: [name], MD;  Location: [facility];  Service: Endoscopy;;  . TONSILLECTOMY     removed as a child  . TOTAL HIP ARTHROPLASTY Left 03/12/2019   Procedure: LEFT TOTAL HIP ARTHROPLASTY ANTERIOR APPROACH;  [name], [name], MD)  . UPPER GASTROINTESTINAL ENDOSCOPY     Family History  Problem Relation Age of Onset  . CAD Father 47       MI, nonsmoker  . Esophageal cancer Father 45  . Stomach cancer Father   . Scleroderma Mother   . Hypertension Mother   . Esophageal cancer Paternal Grandfather   . Stomach cancer Paternal Grandfather   . Diabetes Maternal Grandmother   .  Arthritis Brother   . Stroke Neg Hx   . Colon cancer Neg Hx   . Rectal cancer Neg Hx    Social History:  reports that she has never smoked. She has never been exposed to tobacco smoke. She has never used smokeless tobacco. She reports that she does not drink alcohol and does not use drugs. Allergies:  Allergies  Allergen Reactions  . Celebrex [Celecoxib] Rash    Headaches  . Inh [Isoniazid] Other (See Comments)    Hepatitis  . Cymbalta [Duloxetine Hcl] Other (See Comments)    Urinary retention  . Nucynta [Tapentadol] Other (See Comments)    Agitation, jerking in legs  . Silenor [Doxepin Hcl] Other (See Comments)    nightmare, dizziness, stumbling upon walking, not effective   . Benzoin Dermatitis  . Cephalexin Nausea And Vomiting  . Fentanyl Other (See Comments)    Cold intolerance, arms twitching, insomnia, fatigue  . Codeine Phosphate Nausea And Vomiting and Other (See Comments)    Severe n&v  . Lithium Other (See Comments)    Sores in mouth   . Meperidine Hcl Rash and Other (See Comments)    REACTION: red skin  . Sulfamethoxazole Rash   Medications Prior to Admission  Medication Sig Dispense Refill  . ALPRAZolam (XANAX) 1 MG tablet Take 0.5 mg by mouth at bedtime.  0  . amLODipine (NORVASC) 2.5 MG tablet TAKE ONE TABLET BY MOUTH DAILY **TAKE WITH '5MG'$  TABLET FOR DOSE OF 7.'5MG'$  DAILY** 90 tablet 0  . amLODipine (NORVASC) 5 MG tablet Take 1 tablet (5 mg total) by mouth daily. 90 tablet 0  . amphetamine-dextroamphetamine (ADDERALL XR) 30 MG 24 hr capsule TAKE 1 CAPSUEL BY MOUTH EACH MORNING 90 capsule 0  . ARIPiprazole (ABILIFY) 5 MG tablet Take 5 mg by mouth at bedtime.    . Calcium Carbonate-Vitamin D (CALTRATE 600+D PO) Take 1 tablet by mouth daily.    . cyanocobalamin (,VITAMIN B-12,) 1000 MCG/ML injection INJECT 1ML INTO THE MUSCLE EVERY 21 DAYSAS DIRECTED 3 mL 3  . diclofenac Sodium (VOLTAREN) 1 % GEL APPLY TWO TO FOUR GRAMS TO AFFECTED JOINT UP TO FOUR TIMES A DAY (Patient taking differently: Apply 1 g topically in the morning, at noon, and at bedtime.) 400 g 2  . Eszopiclone 3 MG TABS TAKE 1 TABLET BY MOUTH AT BEDTIME TAKE IMMEDIATLEY BEFORE BEDTIME AS DIRECTED. 90 tablet 0  . ferrous sulfate 325 (65 FE) MG tablet Take 325 mg by mouth every Monday, Wednesday, and Friday.     . fluticasone (FLONASE) 50 MCG/ACT nasal spray PLACE 2 SPRAYS INTO BOTH NOSTRILS DAILY AS NEEDED (SEASONAL ALLERGIES) 16 g 0  . lamoTRIgine (LAMICTAL) 200 MG tablet Take 200 mg by mouth daily.    Marland Kitchen linaclotide (LINZESS) 290 MCG CAPS capsule Take 1 capsule (290 mcg total) by mouth daily. 90 capsule 1  . morphine (MSIR) 15 MG tablet TAKE 1 TABLET BY MOUTH THREE TIMES DAILYAS NEEDED  FOR SEVERE OR MODERATE PAIN 90 tablet 0  . MOVANTIK 25 MG TABS tablet TAKE ONE TABLET BY MOUTH EVERY DAY 90 tablet 1  . Multiple Vitamins-Minerals (PRESERVISION AREDS 2 PO) Take 1 capsule by mouth in the morning and at bedtime.    . ondansetron (ZOFRAN) 4 MG tablet TAKE ONE TABLET EVERY EIGHT HOURS AS NEEDED FOR NAUSEA / VOMITING 30 tablet 0  . pantoprazole (PROTONIX) 40 MG tablet TAKE ONE TABLET TWICE A DAY WITH MEALS 180 tablet 0  . pentosan polysulfate (ELMIRON) 100 MG  capsule Take 200 mg by mouth 2 (two) times daily.     . polyethylene glycol (MIRALAX / GLYCOLAX) 17 g packet Take 17 g by mouth 2 (two) times daily.    . Prasterone (INTRAROSA) 6.5 MG INST Place 1 suppository vaginally at bedtime. 84 each 3  . pregabalin (LYRICA) 150 MG capsule TAKE 1 CAPSULE BY MOUTH TWICE DAILY 180 capsule 0  . PREVIDENT 5000 BOOSTER PLUS 1.1 % PSTE Place 1 application. onto teeth at bedtime.    . sodium chloride (OCEAN) 0.65 % SOLN nasal spray Place 1 spray into both nostrils as needed for congestion.    Marland Kitchen tiZANidine (ZANAFLEX) 2 MG tablet TAKE 1 TABLET BY MOUTH EVERY 8 HOURS AS NEEDED 30 tablet 0  . ursodiol (ACTIGALL) 300 MG capsule Take 1 capsule (300 mg total) by mouth 2 (two) times daily. 180 capsule 0  . amoxicillin (AMOXIL) 500 MG tablet Take 4 tablets (2,'000mg'$ ) by mouth 30-60 minutes prior to dental cleaning. 4 tablet 0  . amoxicillin-clavulanate (AUGMENTIN) 875-125 MG tablet Take 1 tablet by mouth 2 (two) times daily. (Patient not taking: Reported on 12/28/2021) 20 tablet 0  . aspirin 81 MG chewable tablet Chew 1 tablet (81 mg total) by mouth daily. (Patient not taking: Reported on 12/28/2021) 30 tablet 0      Home: [place] expects to be discharged to:: Private residence Living Arrangements: Spouse/significant other Available Help at Discharge: Family, Available 24 hours/day Type of Home: House Home Access: Level entry Home Layout: Two level, Able to live on main level with  bedroom/bathroom Bathroom Shower/Tub: Tub/shower unit, Walk-in shower, Curtain Bathroom Toilet: Handicapped height Bathroom Accessibility: Yes Home Equipment: Conservation officer, nature (2 wheels), [employer] - single point, Civil engineer, contracting - built in, Geneticist, molecular  Lives With: Spouse   Functional History: Prior Function Prior Level of Function : Independent/Modified Independent, Driving, Other (comment)  Functional Status:  Mobility: Bed Mobility Overal bed mobility: Needs Assistance Bed Mobility: Rolling, Sidelying to Sit Rolling: Min guard Sidelying to sit: Min assist Supine to sit: Min assist Sit to sidelying: Mod assist General bed mobility comments: Pt up in chair Transfers Overall transfer level: Needs assistance Equipment used: None Transfers: Sit to/from Stand Sit to Stand: Min assist Bed to/from chair/wheelchair/BSC transfer type:: Stand pivot Stand pivot transfers: Min assist Step pivot transfers: Min assist General transfer comment: minG to rise, slow but stable with initial stand Ambulation/Gait Ambulation/Gait assistance: Min assist, Mod assist Gait Distance (Feet): 100 Feet (+ 5 x 15 ft) Assistive device: None Gait Pattern/deviations: Step-through pattern, Decreased stride length, Narrow base of support, Ataxic, Drifts right/left, Staggering right General Gait Details: up to modA to steady with balance challenge. at times pt with too much forwards momentum and unable to control. minA to steady. Gait velocity: decreased Gait velocity interpretation: <1.31 ft/sec, indicative of household ambulator    ADL: ADL Overall ADL's : Needs assistance/impaired Eating/Feeding: Supervision/ safety, Set up, Sitting (using red tubing) Eating/Feeding Details (indicate cue type and reason): giving built up tubing to help with self feeding Grooming: Standing, Min guard, Wash/dry hands, Wash/dry face, Oral care Grooming Details (indicate cue type and reason): Pt stood at sink for appx 4 minutes.   HR up to 139 at times. Pt fatigues quickly in standing. Upper Body Bathing: Minimal assistance, Sitting Lower Body Bathing: Moderate assistance, Sit to/from stand Upper Body Dressing : Moderate assistance, Sitting Lower Body Dressing: Moderate assistance, Sit to/from stand, Cueing for compensatory techniques Lower Body Dressing Details (indicate cue type and  reason): Pt instructed to bring legs up to her to donn socks and shoes and to start pants over legs to avoid leaning forward. Pt required assist to use LUE to pull pants over the R leg when in figure 4 position.  Pt required assist in standing to pull pants up. Pt instructed to let go of walker only with one hand at a time due to poor balance. Toilet Transfer: Minimal assistance, Rolling walker (2 wheels), Ambulation, Grab bars Toilet Transfer Details (indicate cue type and reason): Pt used RW to walk to bathroom to toilet.  Assisted requird during turns to sit on toilet due to poor balance. Toileting- Clothing Manipulation and Hygiene: Minimal assistance, Sit to/from stand, Cueing for compensatory techniques Toileting - Clothing Manipulation Details (indicate cue type and reason): Cues given to always hold to walker with one hand while managing clothing to avoid falls. Functional mobility during ADLs: Rolling walker (2 wheels), Minimal assistance General ADL Comments: Pt doing well with hand exercises and static standing tasks.  pt requires more assist with all dynamic standing tasks. Pt fatigues quickly with HR up into 130s.  Needs cues to take rest breaks. Feel rehab continues to be best option for this pt to maximize her independence and work on home skills.  Cognition: Cognition Overall Cognitive Status: Within Functional Limits for tasks assessed Orientation Level: Oriented X4 Cognition Arousal/Alertness: Awake/alert Behavior During Therapy: WFL for tasks assessed/performed Overall Cognitive Status: Within Functional Limits for tasks  assessed General Comments: pt able to recall all precautions and apply to mobility  Physical Exam: Blood pressure 110/69, pulse 83, temperature 97.8 F (36.6 C), temperature source Oral, resp. rate 12, height '5\' 3"'$  (1.6 m), weight 59.9 kg, last menstrual period 07/25/1998, SpO2 95 %. Gen: no distress, normal appearing Neck:     Comments: Cervical collar in place, anterior neck incision with steri-strips Cardio: Reg rate Chest: normal effort, normal rate of breathing Abd: soft, non-distended Ext: no edema Psych: pleasant, normal affect Skin: anterior neck incision with steri-strips Neurological:     Comments: Patient is alert.  Oriented x3 and follows commands. 5/5 strength throughout with the exception of left hand grip 4/5.   No results found. However, due to the size of the patient record, not all encounters were searched. Please check Results Review for a complete set of results. No results found.    Blood pressure 110/69, pulse 83, temperature 97.8 F (36.6 C), temperature source Oral, resp. rate 12, height '5\' 3"'$  (1.6 m), weight 59.9 kg, last menstrual period 07/25/1998, SpO2 95 %.  Medical Problem List and Plan: 1. Functional deficits secondary to C5-6 fracture status post 3 staged anterior posterior fusion C5-6 corpectomy, posterior fusion C3-7, ACDF C3-7 12/29/2021 with postoperative left hand weakness consistent with C8 radiculopathy status post revision posterior cervical instrumentation 12/30/2021 for misplacement of thoracic pedicle screw.  Cervical collar when out of bed.  -patient may shower  -ELOS/Goals: 7 days  Admit to CIR 2.  Antithrombotics: -DVT/anticoagulation:  Mechanical: Antiembolism stockings, thigh (TED hose) Bilateral lower extremities.  Check vascular study  -antiplatelet therapy: N/A 3. Pain Management: Lidoderm patch, Lyrica 150 mg twice daily, Flexeril as needed, MS IR 15 mg every 4 hours as needed 4. Mood/Sleep/bipolar disorder: Xanax 0.5 mg  nightly,Escopiclone 3 mg nightly, Lamictal 200 mg daily,  -antipsychotic agents: Abilify 5 mg nightly 5. Neuropsych/cognition: This patient is capable of making decisions on her own behalf. 6. Skin/Wound Care: Routine skin checks 7. Fluids/Electrolytes/Nutrition: Routine in and outs with follow-up  chemistries 8.  Constipation.  MiraLAX twice daily, Movantik 12.5 mg daily, Linzess 290 mcg daily, Colace 100 mg twice daily 9.  Hypertension.  Norvasc 7.5 mg daily.  Monitor with increased mobility 10.  Chronic anemia.  Continue iron supplement.  Follow-up CBC 11.  Interstitial cystitis.Elmiron 200 mg twice daily 12.  GERD.  Protonix 13.  Cholecystitis gallstones.  Actigall 300 mg twice daily  Cathlyn Parsons, PA-C 01/07/2022

## 2022-01-07 ENCOUNTER — Other Ambulatory Visit: Payer: Self-pay

## 2022-01-07 ENCOUNTER — Inpatient Hospital Stay (HOSPITAL_COMMUNITY): Payer: Medicare Other

## 2022-01-07 ENCOUNTER — Inpatient Hospital Stay (HOSPITAL_COMMUNITY)
Admission: RE | Admit: 2022-01-07 | Discharge: 2022-01-15 | DRG: 560 | Disposition: A | Discharge status: Home / Self Care | Payer: Medicare Other | Source: Intra-hospital | Attending: Physical Medicine and Rehabilitation | Admitting: Physical Medicine and Rehabilitation

## 2022-01-07 ENCOUNTER — Encounter (HOSPITAL_COMMUNITY): Payer: Self-pay | Admitting: Physical Medicine and Rehabilitation

## 2022-01-07 DIAGNOSIS — Z4789 Encounter for other orthopedic aftercare: Secondary | ICD-10-CM | POA: Diagnosis present

## 2022-01-07 DIAGNOSIS — M349 Systemic sclerosis, unspecified: Secondary | ICD-10-CM | POA: Diagnosis present

## 2022-01-07 DIAGNOSIS — Z981 Arthrodesis status: Secondary | ICD-10-CM

## 2022-01-07 DIAGNOSIS — H3553 Other dystrophies primarily involving the sensory retina: Secondary | ICD-10-CM | POA: Diagnosis present

## 2022-01-07 DIAGNOSIS — Z96642 Presence of left artificial hip joint: Secondary | ICD-10-CM | POA: Diagnosis present

## 2022-01-07 DIAGNOSIS — Z888 Allergy status to other drugs, medicaments and biological substances status: Secondary | ICD-10-CM

## 2022-01-07 DIAGNOSIS — M797 Fibromyalgia: Secondary | ICD-10-CM | POA: Diagnosis present

## 2022-01-07 DIAGNOSIS — M4322 Fusion of spine, cervical region: Secondary | ICD-10-CM

## 2022-01-07 DIAGNOSIS — Z881 Allergy status to other antibiotic agents status: Secondary | ICD-10-CM | POA: Diagnosis not present

## 2022-01-07 DIAGNOSIS — N3010 Interstitial cystitis (chronic) without hematuria: Secondary | ICD-10-CM | POA: Diagnosis present

## 2022-01-07 DIAGNOSIS — F419 Anxiety disorder, unspecified: Secondary | ICD-10-CM | POA: Diagnosis present

## 2022-01-07 DIAGNOSIS — D649 Anemia, unspecified: Secondary | ICD-10-CM

## 2022-01-07 DIAGNOSIS — Z8261 Family history of arthritis: Secondary | ICD-10-CM

## 2022-01-07 DIAGNOSIS — K5900 Constipation, unspecified: Secondary | ICD-10-CM | POA: Diagnosis not present

## 2022-01-07 DIAGNOSIS — I7300 Raynaud's syndrome without gangrene: Secondary | ICD-10-CM | POA: Diagnosis present

## 2022-01-07 DIAGNOSIS — M5410 Radiculopathy, site unspecified: Secondary | ICD-10-CM | POA: Diagnosis present

## 2022-01-07 DIAGNOSIS — K581 Irritable bowel syndrome with constipation: Secondary | ICD-10-CM | POA: Diagnosis present

## 2022-01-07 DIAGNOSIS — F3130 Bipolar disorder, current episode depressed, mild or moderate severity, unspecified: Secondary | ICD-10-CM | POA: Diagnosis present

## 2022-01-07 DIAGNOSIS — M7989 Other specified soft tissue disorders: Secondary | ICD-10-CM

## 2022-01-07 DIAGNOSIS — Z96651 Presence of right artificial knee joint: Secondary | ICD-10-CM | POA: Diagnosis present

## 2022-01-07 DIAGNOSIS — S12501S Unspecified nondisplaced fracture of sixth cervical vertebra, sequela: Secondary | ICD-10-CM

## 2022-01-07 DIAGNOSIS — Z885 Allergy status to narcotic agent status: Secondary | ICD-10-CM

## 2022-01-07 DIAGNOSIS — Z79899 Other long term (current) drug therapy: Secondary | ICD-10-CM

## 2022-01-07 DIAGNOSIS — Z9882 Breast implant status: Secondary | ICD-10-CM | POA: Diagnosis not present

## 2022-01-07 DIAGNOSIS — I10 Essential (primary) hypertension: Secondary | ICD-10-CM | POA: Diagnosis present

## 2022-01-07 DIAGNOSIS — G4700 Insomnia, unspecified: Secondary | ICD-10-CM | POA: Diagnosis present

## 2022-01-07 DIAGNOSIS — Z8249 Family history of ischemic heart disease and other diseases of the circulatory system: Secondary | ICD-10-CM

## 2022-01-07 DIAGNOSIS — K219 Gastro-esophageal reflux disease without esophagitis: Secondary | ICD-10-CM

## 2022-01-07 DIAGNOSIS — K5904 Chronic idiopathic constipation: Secondary | ICD-10-CM | POA: Diagnosis not present

## 2022-01-07 DIAGNOSIS — K8010 Calculus of gallbladder with chronic cholecystitis without obstruction: Secondary | ICD-10-CM | POA: Diagnosis present

## 2022-01-07 DIAGNOSIS — M5412 Radiculopathy, cervical region: Secondary | ICD-10-CM | POA: Diagnosis not present

## 2022-01-07 DIAGNOSIS — M5010 Cervical disc disorder with radiculopathy, unspecified cervical region: Secondary | ICD-10-CM

## 2022-01-07 DIAGNOSIS — K8020 Calculus of gallbladder without cholecystitis without obstruction: Secondary | ICD-10-CM

## 2022-01-07 HISTORY — DX: Essential (primary) hypertension: I10

## 2022-01-07 MED ORDER — LINACLOTIDE 145 MCG PO CAPS
290.0000 ug | ORAL_CAPSULE | Freq: Every day | ORAL | Status: DC
  Administered 2022-01-08 – 2022-01-09 (×2): 290 ug via ORAL
  Filled 2022-01-07 (×3): qty 2

## 2022-01-07 MED ORDER — NALOXEGOL OXALATE 12.5 MG PO TABS
12.5000 mg | ORAL_TABLET | Freq: Every day | ORAL | Status: DC
  Administered 2022-01-08 – 2022-01-09 (×2): 12.5 mg via ORAL
  Filled 2022-01-07 (×3): qty 1

## 2022-01-07 MED ORDER — LIDOCAINE 5 % EX PTCH
1.0000 | MEDICATED_PATCH | CUTANEOUS | Status: DC
  Administered 2022-01-08 – 2022-01-10 (×3): 1 via TRANSDERMAL
  Filled 2022-01-07 (×3): qty 1

## 2022-01-07 MED ORDER — DOCUSATE SODIUM 100 MG PO CAPS
100.0000 mg | ORAL_CAPSULE | Freq: Two times a day (BID) | ORAL | Status: DC
  Administered 2022-01-07 – 2022-01-14 (×11): 100 mg via ORAL
  Filled 2022-01-07 (×16): qty 1

## 2022-01-07 MED ORDER — LAMOTRIGINE 100 MG PO TABS
200.0000 mg | ORAL_TABLET | Freq: Every day | ORAL | Status: DC
Start: 2022-01-08 — End: 2022-01-15
  Administered 2022-01-08 – 2022-01-15 (×8): 200 mg via ORAL
  Filled 2022-01-07 (×8): qty 2

## 2022-01-07 MED ORDER — ONDANSETRON HCL 4 MG PO TABS: 4.0000 mg | ORAL_TABLET | Freq: Four times a day (QID) | ORAL | Status: DC | PRN

## 2022-01-07 MED ORDER — URSODIOL 300 MG PO CAPS
300.0000 mg | ORAL_CAPSULE | Freq: Two times a day (BID) | ORAL | Status: DC
  Administered 2022-01-07 – 2022-01-15 (×16): 300 mg via ORAL
  Filled 2022-01-07 (×17): qty 1

## 2022-01-07 MED ORDER — ACETAMINOPHEN 650 MG RE SUPP: 650.0000 mg | RECTAL | Status: DC | PRN

## 2022-01-07 MED ORDER — PREGABALIN 75 MG PO CAPS
150.0000 mg | ORAL_CAPSULE | Freq: Two times a day (BID) | ORAL | Status: DC
  Administered 2022-01-07 – 2022-01-15 (×16): 150 mg via ORAL
  Filled 2022-01-07 (×16): qty 2

## 2022-01-07 MED ORDER — CYCLOBENZAPRINE HCL 10 MG PO TABS
10.0000 mg | ORAL_TABLET | Freq: Three times a day (TID) | ORAL | Status: DC | PRN
  Administered 2022-01-07 – 2022-01-11 (×8): 10 mg via ORAL
  Filled 2022-01-07 (×8): qty 1

## 2022-01-07 MED ORDER — MORPHINE SULFATE 15 MG PO TABS
15.0000 mg | ORAL_TABLET | ORAL | Status: DC | PRN
  Administered 2022-01-07 – 2022-01-15 (×18): 15 mg via ORAL
  Filled 2022-01-07 (×18): qty 1

## 2022-01-07 MED ORDER — ZOLPIDEM TARTRATE 5 MG PO TABS
5.0000 mg | ORAL_TABLET | Freq: Every day | ORAL | Status: DC
  Filled 2022-01-07: qty 1

## 2022-01-07 MED ORDER — ACETAMINOPHEN 325 MG PO TABS: 650.0000 mg | ORAL_TABLET | ORAL | Status: DC | PRN

## 2022-01-07 MED ORDER — BISACODYL 10 MG RE SUPP: 10.0000 mg | Freq: Every day | RECTAL | Status: DC | PRN

## 2022-01-07 MED ORDER — ACETAMINOPHEN 325 MG PO TABS
650.0000 mg | ORAL_TABLET | ORAL | Status: DC | PRN
  Administered 2022-01-10 – 2022-01-11 (×2): 650 mg via ORAL
  Filled 2022-01-07 (×2): qty 2

## 2022-01-07 MED ORDER — ALPRAZOLAM 0.5 MG PO TABS
0.5000 mg | ORAL_TABLET | Freq: Every day | ORAL | Status: DC
  Administered 2022-01-07 – 2022-01-14 (×8): 0.5 mg via ORAL
  Filled 2022-01-07 (×8): qty 1

## 2022-01-07 MED ORDER — PANTOPRAZOLE SODIUM 40 MG PO TBEC
40.0000 mg | DELAYED_RELEASE_TABLET | Freq: Two times a day (BID) | ORAL | Status: DC
  Administered 2022-01-07 – 2022-01-15 (×16): 40 mg via ORAL
  Filled 2022-01-07 (×16): qty 1

## 2022-01-07 MED ORDER — AMPHETAMINE-DEXTROAMPHET ER 10 MG PO CP24
30.0000 mg | ORAL_CAPSULE | Freq: Every day | ORAL | Status: DC
  Administered 2022-01-08 – 2022-01-15 (×8): 30 mg via ORAL
  Filled 2022-01-07 (×8): qty 3

## 2022-01-07 MED ORDER — ARIPIPRAZOLE 5 MG PO TABS
5.0000 mg | ORAL_TABLET | Freq: Every day | ORAL | Status: DC
  Administered 2022-01-07 – 2022-01-14 (×8): 5 mg via ORAL
  Filled 2022-01-07 (×8): qty 1

## 2022-01-07 MED ORDER — ONDANSETRON HCL 4 MG/2ML IJ SOLN: 4.0000 mg | Freq: Four times a day (QID) | INTRAMUSCULAR | Status: DC | PRN

## 2022-01-07 MED ORDER — AMLODIPINE BESYLATE 2.5 MG PO TABS
7.5000 mg | ORAL_TABLET | Freq: Every day | ORAL | Status: DC
Start: 2022-01-08 — End: 2022-01-15
  Administered 2022-01-08 – 2022-01-15 (×8): 7.5 mg via ORAL
  Filled 2022-01-07 (×8): qty 1

## 2022-01-07 MED ORDER — ESZOPICLONE 3 MG PO TABS
3.0000 mg | ORAL_TABLET | Freq: Every day | ORAL | Status: DC
  Administered 2022-01-07 – 2022-01-14 (×8): 3 mg via ORAL

## 2022-01-07 MED ORDER — PENTOSAN POLYSULFATE SODIUM 100 MG PO CAPS
200.0000 mg | ORAL_CAPSULE | Freq: Two times a day (BID) | ORAL | Status: DC
  Administered 2022-01-07 – 2022-01-15 (×16): 200 mg via ORAL
  Filled 2022-01-07 (×17): qty 2

## 2022-01-07 MED ORDER — FERROUS SULFATE 325 (65 FE) MG PO TABS
325.0000 mg | ORAL_TABLET | ORAL | Status: DC
  Administered 2022-01-10 – 2022-01-14 (×3): 325 mg via ORAL
  Filled 2022-01-07 (×4): qty 1

## 2022-01-07 MED ORDER — POLYETHYLENE GLYCOL 3350 17 G PO PACK
17.0000 g | PACK | Freq: Two times a day (BID) | ORAL | Status: DC
  Administered 2022-01-09: 17 g via ORAL
  Filled 2022-01-07 (×6): qty 1

## 2022-01-07 NOTE — Progress Notes (Signed)
Inpatient Rehabilitation Admissions Coordinator   CIR bed is available to admit her today. I have notified acute team and will make arrangements to admit today.  Danne Baxter, RN, MSN Rehab Admissions Coordinator [phone] 01/07/2022 10:12 AM

## 2022-01-07 NOTE — Discharge Summary (Signed)
Physician Discharge Summary  Patient ID: Rachel Duran MRN: [id number] DOB/AGE: Mar 14, 1956 66 y.o.  Admit date: 12/29/2021 Discharge date: 01/07/2022  Admission Diagnoses: Cervical fracture, cervical kyphosis, cervicalgia  Discharge Diagnoses: The same Principal Problem:   Cervical radiculopathy Active Problems:   C6 cervical fracture [facility])   Discharged Condition: fair  Hospital Course: I performed an anterior and posterior decompression, instrumentation and fusion on the patient on 12/29/2021.  Postoperatively the patient had left hand weakness consistent with a C8 radiculopathy.  An MRI scan looked good.  His CT scan demonstrated malposition of the left T1 pedicle screw.  I revised her instrumentation fusion on 12/30/2021.  Postoperatively the patient's hand weakness has continuously improved.  She has been a bit unsteady on her gait.  PT and OT have been working with her and they recommend inpatient rehab.  This was approved.  On 01/07/2022 the patient requested discharge to rehab.  The patient was given verbal and written discharge instructions.  All her questions were answered.  Consults: PT, OT, care management, rehab Significant Diagnostic Studies: Cervical MRI, cervical CT Treatments: Anterior and posterior cervical instrumentation fusion 12/29/2021, revision revision of hardware on 12/30/2021. Discharge Exam: Blood pressure 110/69, pulse 83, temperature 97.8 F (36.6 C), temperature source Oral, resp. rate 12, height '5\' 3"'$  (1.6 m), weight 59.9 kg, last menstrual period 07/25/1998, SpO2 95 %. The patient is alert and pleasant.  She looks well.  Her left hand strength has improved to 4+/5.  Her anterior and posterior cervical incisions are healing well.  Disposition: Rehab  Discharge Instructions     Call MD for:  difficulty breathing, headache or visual disturbances   Complete by: As directed    Call MD for:  extreme fatigue   Complete by: As directed    Call MD for:   hives   Complete by: As directed    Call MD for:  persistant dizziness or light-headedness   Complete by: As directed    Call MD for:  persistant nausea and vomiting   Complete by: As directed    Call MD for:  redness, tenderness, or signs of infection (pain, swelling, redness, odor or green/yellow discharge around incision site)   Complete by: As directed    Call MD for:  severe uncontrolled pain   Complete by: As directed    Call MD for:  temperature >100.4   Complete by: As directed    Diet - low sodium heart healthy   Complete by: As directed    Discharge instructions   Complete by: As directed    Call [phone] for a followup appointment. Take a stool softener while you are using pain medications.   Driving Restrictions   Complete by: As directed    Do not drive for 2 weeks.   Increase activity slowly   Complete by: As directed    Lifting restrictions   Complete by: As directed    Do not lift more than 5 pounds. No excessive bending or twisting.   May shower / Bathe   Complete by: As directed    Remove the dressing for 3 days after surgery.  You may shower, but leave the incision alone.   No dressing needed   Complete by: As directed       Allergies as of 01/07/2022       Reactions   Celebrex [celecoxib] Rash   Headaches   Inh [isoniazid] Other (See Comments)   Hepatitis   Cymbalta [duloxetine Hcl] Other (See Comments)  Urinary retention   Nucynta [tapentadol] Other (See Comments)   Agitation, jerking in legs   Silenor [doxepin Hcl] Other (See Comments)   nightmare, dizziness, stumbling upon walking, not effective   Benzoin Dermatitis   Cephalexin Nausea And Vomiting   Fentanyl Other (See Comments)   Cold intolerance, arms twitching, insomnia, fatigue   Codeine Phosphate Nausea And Vomiting, Other (See Comments)   Severe n&v   Lithium Other (See Comments)   Sores in mouth   Meperidine Hcl Rash, Other (See Comments)   REACTION: red skin    Sulfamethoxazole Rash        Medication List     STOP taking these medications    diclofenac Sodium 1 % Gel Commonly known as: VOLTAREN       TAKE these medications    ALPRAZolam 1 MG tablet Commonly known as: XANAX Take 0.5 mg by mouth at bedtime.   amLODipine 5 MG tablet Commonly known as: NORVASC Take 1 tablet (5 mg total) by mouth daily.   amLODipine 2.5 MG tablet Commonly known as: NORVASC TAKE ONE TABLET BY MOUTH DAILY **TAKE WITH '5MG'$  TABLET FOR DOSE OF 7.'5MG'$  DAILY**   amoxicillin 500 MG tablet Commonly known as: AMOXIL Take 4 tablets (2,'000mg'$ ) by mouth 30-60 minutes prior to dental cleaning.   amoxicillin-clavulanate 875-125 MG tablet Commonly known as: AUGMENTIN Take 1 tablet by mouth 2 (two) times daily.   amphetamine-dextroamphetamine 30 MG 24 hr capsule Commonly known as: Adderall XR TAKE 1 CAPSUEL BY MOUTH EACH MORNING   ARIPiprazole 5 MG tablet Commonly known as: ABILIFY Take 5 mg by mouth at bedtime.   aspirin 81 MG chewable tablet Chew 1 tablet (81 mg total) by mouth daily.   CALTRATE 600+D PO Take 1 tablet by mouth daily.   cyanocobalamin 1000 MCG/ML injection Commonly known as: (VITAMIN B-12) INJECT 1ML INTO THE MUSCLE EVERY 21 DAYSAS DIRECTED   Eszopiclone 3 MG Tabs TAKE 1 TABLET BY MOUTH AT BEDTIME TAKE IMMEDIATLEY BEFORE BEDTIME AS DIRECTED.   ferrous sulfate 325 (65 FE) MG tablet Take 325 mg by mouth every Monday, Wednesday, and Friday.   fluticasone 50 MCG/ACT nasal spray Commonly known as: FLONASE PLACE 2 SPRAYS INTO BOTH NOSTRILS DAILY AS NEEDED (SEASONAL ALLERGIES)   Intrarosa 6.5 MG Inst Generic drug: Prasterone Place 1 suppository vaginally at bedtime.   lamoTRIgine 200 MG tablet Commonly known as: LAMICTAL Take 200 mg by mouth daily.   linaclotide 290 MCG Caps capsule Commonly known as: Linzess Take 1 capsule (290 mcg total) by mouth daily.   morphine 15 MG tablet Commonly known as: MSIR TAKE 1 TABLET BY MOUTH  THREE TIMES DAILYAS NEEDED FOR SEVERE OR MODERATE PAIN   Movantik 25 MG Tabs tablet Generic drug: naloxegol oxalate TAKE ONE TABLET BY MOUTH EVERY DAY   ondansetron 4 MG tablet Commonly known as: ZOFRAN TAKE ONE TABLET EVERY EIGHT HOURS AS NEEDED FOR NAUSEA / VOMITING   pantoprazole 40 MG tablet Commonly known as: PROTONIX TAKE ONE TABLET TWICE A DAY WITH MEALS   pentosan polysulfate 100 MG capsule Commonly known as: ELMIRON Take 200 mg by mouth 2 (two) times daily.   polyethylene glycol 17 g packet Commonly known as: MIRALAX / GLYCOLAX Take 17 g by mouth 2 (two) times daily.   pregabalin 150 MG capsule Commonly known as: LYRICA TAKE 1 CAPSULE BY MOUTH TWICE DAILY   PRESERVISION AREDS 2 PO Take 1 capsule by mouth in the morning and at bedtime.   PreviDent 5000 Booster Plus  1.1 % Pste Generic drug: Sodium Fluoride Place 1 application. onto teeth at bedtime.   sodium chloride 0.65 % Soln nasal spray Commonly known as: OCEAN Place 1 spray into both nostrils as needed for congestion.   tiZANidine 2 MG tablet Commonly known as: ZANAFLEX TAKE 1 TABLET BY MOUTH EVERY 8 HOURS AS NEEDED   ursodiol 300 MG capsule Commonly known as: ACTIGALL Take 1 capsule (300 mg total) by mouth 2 (two) times daily.               Discharge Care Instructions  (From admission, onward)           Start     Ordered   01/07/22 0000  No dressing needed        01/07/22 1252             Signed: [name] 01/07/2022, 7:00 AM

## 2022-01-07 NOTE — H&P (Signed)
Physical Medicine and Rehabilitation Admission H&P  CC: C6 fracture and cervical radiculopathy  HPI: Rachel Duran is a 66 year old right-handed female history of chronic anemia, bipolar disorder/depression/fibromyalgia, ACDF 2004 C5-6 and C6-7 and 2017 C3-4 and C4-5, left total hip arthroplasty 2020, scleroderma.  Per chart review patient lives with spouse.  Two-level home bed and bath on main level with level entry.  Independent prior to admission and driving.  Presented 12/29/2021 with noted history of fall 10/29/2021 suffering a C5-6 fracture subluxation/cervical kyphosis with persistent neck pain and initial conservative care with failed medical management.  Underwent 3 stage anterior posterior fusion C5-6 corpectomy, posterior fusion C3-7, ACDF C3-7 12/29/2021.  Postop she developed left hand weakness consistent with a C8 radiculopathy as well as small amount of epidural fluid at C5-6.  Cervical CT demonstrated that the left T1 pedicle screw was a bit low and into the neural foramen.  Patient underwent revision of posterior cervical instrumentation 12/30/2021 per Dr. Newman Pies.  Placed in a cervical hard collar when out of bed.  Hospital course pain management.  Acute blood loss anemia 9.8 and monitored.  Tolerating a regular consistency diet.  Therapy evaluations completed due to patient decreased functional mobility was admitted for a comprehensive rehab program. Currently complains of left hand weakness.   Review of Systems  Constitutional:  Negative for chills and fever.  HENT:  Negative for hearing loss.   Eyes:  Negative for blurred vision and double vision.  Respiratory:  Negative for cough.   Cardiovascular:  Negative for chest pain, palpitations and leg swelling.  Gastrointestinal:  Positive for constipation. Negative for heartburn, nausea and vomiting.       GERD  Genitourinary:  Positive for urgency. Negative for dysuria, flank pain and hematuria.  Musculoskeletal:  Positive  for joint pain, myalgias and neck pain.  Skin:  Negative for rash.  Neurological:  Positive for headaches.  Psychiatric/Behavioral:  Positive for depression.        Anxiety/bipolar disorder  All other systems reviewed and are negative.  Past Medical History:  Diagnosis Date   Abdominal pain last 4 months   and nausea also   Allergy    Anemia    history of   Anxiety    Bipolar disorder (HCC)    atpical bipolar disorder   Cervical disc disease limited rom turning to left   hx. C6- C7 -hx. past fusion(bone graft used)   Cholecystitis    Chronic pain    DDD (degenerative disc disease), lumbar 09/2015   dextroscoliosis with multilevel DDD and facet arthrosis most notable for R foraminal disc protrusion L4/5 producing severe R neural foraminal stenosis abutting R L4 nerve root, moderate spinal canal and mild lat recesss and R neural foraminal stenosis L3/4 (MRI)   Depression    bipolar depression   Disorders of porphyrin metabolism    Felon of finger of left hand 11/10/2016   Fibromyalgia    GERD (gastroesophageal reflux disease)    Headache    occasionally   Internal hemorrhoids    Interstitial cystitis 06/06/2012   hx.   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 (Dr Baird Cancer retinologist)   Tuberculosis    Past Surgical History:  Procedure Laterality Date   ANTERIOR CERVICAL DECOMP/DISCECTOMY FUSION  2004   C5/6, C6/7   ANTERIOR CERVICAL DECOMP/DISCECTOMY FUSION  02/2016   C3/4, C4/5 with plating [name])   ANTERIOR CERVICAL DECOMP/DISCECTOMY FUSION  12/29/2021    Procedure: ANTERIOR CERVICAL DECOMPRESSION/ DISCECTOMY FUSION CERVICAL THREE - SEVEN;  Surgeon: Newman Pies, MD;  Location: [place];  Service: Neurosurgery;;   ANTERIOR CERVICAL DECOMPRESSION/DISCECTOMY FUSION 4 LEVELS N/A 12/29/2021   Procedure: CERVICAL FIVE-SIX CORPECTOMY,;  Surgeon: Newman Pies, MD;  Location: [place];  Service: Neurosurgery;  Laterality: N/A;   AUGMENTATION MAMMAPLASTY Bilateral 03/25/2010   BREAST ENHANCEMENT SURGERY  2010   BREAST IMPLANT EXCHANGE  10/2014   exchange saline implants, B mastopexy/capsulorraphy ([facility])   BUNIONECTOMY Bilateral yrs ago   [place]   x 1   CHOLECYSTECTOMY  06/11/2012   Procedure: LAPAROSCOPIC CHOLECYSTECTOMY WITH INTRAOPERATIVE CHOLANGIOGRAM;  Surgeon: [name], MD,FACS;  Location: [facility];  Service: General;  Laterality: N/A;   COLONOSCOPY  02/2018   done for positive cologuard - WNL, rpt 10 yrs Fuller Plan)   CYSTOSCOPY     ERCP  05/22/2012   Procedure: ENDOSCOPIC RETROGRADE CHOLANGIOPANCREATOGRAPHY (ERCP);  Surgeon: [name], MD,FACG;  Location: [facility];  Service: Endoscopy;  Laterality: N/A;   ERCP N/A 09/17/2013   Procedure: ENDOSCOPIC RETROGRADE CHOLANGIOPANCREATOGRAPHY (ERCP);  Surgeon: [name], MD;  Location: [facility];  Service: Endoscopy;  Laterality: N/A;   ERCP N/A 09/27/2018   Procedure: ENDOSCOPIC RETROGRADE CHOLANGIOPANCREATOGRAPHY (ERCP);  Surgeon: [name], MD;  Location: [facility];  Service: Endoscopy;  Laterality: N/A;   ESOPHAGOGASTRODUODENOSCOPY  09/2016   WNL. esophagus dilated [facility] Plan)   HEMORRHOID BANDING  09/23/2013   --Dr. Greer Pickerel   HERNIA REPAIR     inguinal   HYSTEROSCOPY W/ ENDOMETRIAL ABLATION     KNEE ARTHROSCOPY Right 04/06/2021   Procedure: RIGHT KNEE ARTHROSCOPY WITH DEBRIDEMENT;  Surgeon: [name], MD;  Location: [place];  Service: Orthopedics;  Laterality: Right;   NASAL SINUS SURGERY     x5   PARTIAL KNEE  ARTHROPLASTY Right 06/01/2020   Procedure: Right knee patellofemoral replacement;  Surgeon: [name], MD;  Location: [place];  Service: Orthopedics;  Laterality: Right;   POSTERIOR CERVICAL FUSION/FORAMINOTOMY N/A 12/29/2021   Procedure: POSTERIOR CERVICAL FUSION CERVICAL THREE-SEVEN;  Surgeon: Newman Pies, MD;  Location: [place];  Service: Neurosurgery;  Laterality: N/A;   REMOVAL OF STONES  09/27/2018   Procedure: REMOVAL OF STONES;  Surgeon: [name], MD;  Location: [facility];  Service: Endoscopy;;   SPHINCTEROTOMY  09/27/2018   Procedure: SPHINCTEROTOMY;  Surgeon: [name], MD;  Location: [facility];  Service: Endoscopy;;   TONSILLECTOMY     removed as a child   TOTAL HIP ARTHROPLASTY Left 03/12/2019   Procedure: LEFT TOTAL HIP ARTHROPLASTY ANTERIOR APPROACH;  [name], [name], MD)   UPPER GASTROINTESTINAL ENDOSCOPY     Family History  Problem Relation Age of Onset   CAD Father 67       MI, nonsmoker   Esophageal cancer Father 54   Stomach cancer Father    Scleroderma Mother    Hypertension Mother    Esophageal cancer Paternal Grandfather    Stomach cancer Paternal Grandfather  Diabetes Maternal Grandmother    Arthritis Brother    Stroke Neg Hx    Colon cancer Neg Hx    Rectal cancer Neg Hx    Social History:  reports that she has never smoked. She has never been exposed to tobacco smoke. She has never used smokeless tobacco. She reports that she does not drink alcohol and does not use drugs. Allergies:  Allergies  Allergen Reactions   Celebrex [Celecoxib] Rash    Headaches   Inh [Isoniazid] Other (See Comments)    Hepatitis   Cymbalta [Duloxetine Hcl] Other (See Comments)    Urinary retention   Nucynta [Tapentadol] Other (See Comments)    Agitation, jerking in legs   Silenor [Doxepin Hcl] Other (See Comments)    nightmare, dizziness, stumbling upon walking, not effective   Benzoin Dermatitis   Cephalexin  Nausea And Vomiting   Fentanyl Other (See Comments)    Cold intolerance, arms twitching, insomnia, fatigue   Codeine Phosphate Nausea And Vomiting and Other (See Comments)    Severe n&v   Lithium Other (See Comments)    Sores in mouth    Meperidine Hcl Rash and Other (See Comments)    REACTION: red skin   Sulfamethoxazole Rash   Medications Prior to Admission  Medication Sig Dispense Refill   ALPRAZolam (XANAX) 1 MG tablet Take 0.5 mg by mouth at bedtime.  0   amLODipine (NORVASC) 2.5 MG tablet TAKE ONE TABLET BY MOUTH DAILY **TAKE WITH '5MG'$  TABLET FOR DOSE OF 7.'5MG'$  DAILY** 90 tablet 0   amLODipine (NORVASC) 5 MG tablet Take 1 tablet (5 mg total) by mouth daily. 90 tablet 0   amphetamine-dextroamphetamine (ADDERALL XR) 30 MG 24 hr capsule TAKE 1 CAPSUEL BY MOUTH EACH MORNING 90 capsule 0   ARIPiprazole (ABILIFY) 5 MG tablet Take 5 mg by mouth at bedtime.     Calcium Carbonate-Vitamin D (CALTRATE 600+D PO) Take 1 tablet by mouth daily.     cyanocobalamin (,VITAMIN B-12,) 1000 MCG/ML injection INJECT 1ML INTO THE MUSCLE EVERY 21 DAYSAS DIRECTED 3 mL 3   diclofenac Sodium (VOLTAREN) 1 % GEL APPLY TWO TO FOUR GRAMS TO AFFECTED JOINT UP TO FOUR TIMES A DAY (Patient taking differently: Apply 1 g topically in the morning, at noon, and at bedtime.) 400 g 2   Eszopiclone 3 MG TABS TAKE 1 TABLET BY MOUTH AT BEDTIME TAKE IMMEDIATLEY BEFORE BEDTIME AS DIRECTED. 90 tablet 0   ferrous sulfate 325 (65 FE) MG tablet Take 325 mg by mouth every Monday, Wednesday, and Friday.      fluticasone (FLONASE) 50 MCG/ACT nasal spray PLACE 2 SPRAYS INTO BOTH NOSTRILS DAILY AS NEEDED (SEASONAL ALLERGIES) 16 g 0   lamoTRIgine (LAMICTAL) 200 MG tablet Take 200 mg by mouth daily.     linaclotide (LINZESS) 290 MCG CAPS capsule Take 1 capsule (290 mcg total) by mouth daily. 90 capsule 1   morphine (MSIR) 15 MG tablet TAKE 1 TABLET BY MOUTH THREE TIMES DAILYAS NEEDED FOR SEVERE OR MODERATE PAIN 90 tablet 0   MOVANTIK 25 MG  TABS tablet TAKE ONE TABLET BY MOUTH EVERY DAY 90 tablet 1   Multiple Vitamins-Minerals (PRESERVISION AREDS 2 PO) Take 1 capsule by mouth in the morning and at bedtime.     ondansetron (ZOFRAN) 4 MG tablet TAKE ONE TABLET EVERY EIGHT HOURS AS NEEDED FOR NAUSEA / VOMITING 30 tablet 0   pantoprazole (PROTONIX) 40 MG tablet TAKE ONE TABLET TWICE A DAY WITH MEALS 180 tablet 0  pentosan polysulfate (ELMIRON) 100 MG capsule Take 200 mg by mouth 2 (two) times daily.      polyethylene glycol (MIRALAX / GLYCOLAX) 17 g packet Take 17 g by mouth 2 (two) times daily.     Prasterone (INTRAROSA) 6.5 MG INST Place 1 suppository vaginally at bedtime. 84 each 3   pregabalin (LYRICA) 150 MG capsule TAKE 1 CAPSULE BY MOUTH TWICE DAILY 180 capsule 0   PREVIDENT 5000 BOOSTER PLUS 1.1 % PSTE Place 1 application. onto teeth at bedtime.     sodium chloride (OCEAN) 0.65 % SOLN nasal spray Place 1 spray into both nostrils as needed for congestion.     tiZANidine (ZANAFLEX) 2 MG tablet TAKE 1 TABLET BY MOUTH EVERY 8 HOURS AS NEEDED 30 tablet 0   ursodiol (ACTIGALL) 300 MG capsule Take 1 capsule (300 mg total) by mouth 2 (two) times daily. 180 capsule 0   amoxicillin (AMOXIL) 500 MG tablet Take 4 tablets (2,'000mg'$ ) by mouth 30-60 minutes prior to dental cleaning. 4 tablet 0   amoxicillin-clavulanate (AUGMENTIN) 875-125 MG tablet Take 1 tablet by mouth 2 (two) times daily. (Patient not taking: Reported on 12/28/2021) 20 tablet 0   aspirin 81 MG chewable tablet Chew 1 tablet (81 mg total) by mouth daily. (Patient not taking: Reported on 12/28/2021) 30 tablet 0     Physical Exam: Last menstrual period 07/25/1998. Gen: no distress, normal appearing Neck:     Comments: Cervical collar in place, anterior neck incision with steri-strips Cardio: Reg rate Chest: normal effort, normal rate of breathing Abd: soft, non-distended Ext: no edema Psych: pleasant, normal affect Skin: anterior neck incision with  steri-strips Neurological:     Comments: Patient is alert.  Oriented x3 and follows commands. 5/5 strength throughout with the exception of left hand grip 4/5.   No results found. However, due to the size of the patient record, not all encounters were searched. Please check Results Review for a complete set of results. No results found.    Last menstrual period 07/25/1998.  Medical Problem List and Plan: 1. Functional deficits secondary to C5-6 fracture status post 3 staged anterior posterior fusion C5-6 corpectomy, posterior fusion C3-7, ACDF C3-7 12/29/2021 with postoperative left hand weakness consistent with C8 radiculopathy status post revision posterior cervical instrumentation 12/30/2021 for misplacement of thoracic pedicle screw.  Cervical collar when out of bed.  -patient may shower  -ELOS/Goals: 7 days  Admit to CIR  D/c IV on admission  Will order grounds pass 2.  Antithrombotics: -DVT/anticoagulation:  Mechanical: Antiembolism stockings, thigh (TED hose) Bilateral lower extremities.  Check vascular study  -antiplatelet therapy: N/A 3. Pain Management: Lidoderm patch, Lyrica 150 mg twice daily, Flexeril as needed, MS IR 15 mg every 4 hours as needed 4. Mood/Sleep/bipolar disorder: Xanax 0.5 mg nightly,Escopiclone 3 mg nightly, Lamictal 200 mg daily,  -antipsychotic agents: Abilify 5 mg nightly 5. Neuropsych/cognition: This patient is capable of making decisions on her own behalf. 6. Skin/Wound Care: Routine skin checks 7. Fluids/Electrolytes/Nutrition: Routine in and outs with follow-up chemistries 8.  Constipation.  MiraLAX twice daily, Movantik 12.5 mg daily, Linzess 290 mcg daily, Colace 100 mg twice daily 9.  Hypertension.  Norvasc 7.5 mg daily.  Monitor with increased mobility 10.  Chronic anemia.  Continue iron supplement.  Follow-up CBC 11.  Interstitial cystitis: continue Elmiron 200 mg twice daily 12.  GERD.  Continue Protonix 13.  Cholecystitis gallstones.  Continue  Actigall 300 mg twice daily  I have personally performed a face to  face diagnostic evaluation, including, but not limited to relevant history and physical exam findings, of this patient and developed relevant assessment and plan.  Additionally, I have reviewed and concur with the physician assistant's documentation above.  Leeroy Cha, MD, ABPMR   Lavon Paganini Angiulli, PA-C

## 2022-01-07 NOTE — Progress Notes (Signed)
Bilateral LE venous study completed. Please see CV Proc for preliminary results.  Anderson Malta  Madisson Kulaga BS, RVT 01/07/2022 4:27 PM

## 2022-01-07 NOTE — Progress Notes (Signed)
Izora Ribas, MD  Physician Physical Medicine and Rehabilitation PMR Pre-admission    Signed Date of Service:  01/04/2022  1:21 PM  Related encounter: Admission (Discharged) from 12/29/2021 in [place]      PMR Admission Coordinator Pre-Admission Assessment   Patient: Rachel Duran is an 66 y.o., female MRN: [id number] DOB: 11-25-1955 Height: 5\' 3"  (160 cm) Weight: 59.9 kg Insurance Information HMO:     PPO:      PCP:      IPA:      80/20:      OTHER:  PRIMARY: U[employer] Medicare      Policy#: [id number]      Subscriber: pt CM Name: expedited appeal      Phone#: [phone] option [phone]     Fax#: [phone] Pre-Cert#: [id number] denied with [employer]. Appeal overturned approved for 7 days      Employer:  Benefits:  Phone #: [phone]     Name: 6/12 Eff. Date: 07/25/21     Deduct: $200      Out of Pocket Max: $2200 CIR: 100% coverage      SNF: 100 coverage for 100 days Outpatient: no copayment     Co-Pay: visits per medical neccesity Home Health: 100%      Co-Pay: visits epr medical neccesity DME: 100%     Co-Pay: none Providers: in network  SECONDARY: none   Financial Counselor:       Phone#:    The Engineer, petroleum" for patients in Inpatient Rehabilitation Facilities with attached "Privacy Act [place] Records" was provided and verbally reviewed with: Patient and Family   Emergency Contact Information Contact Information       Name Relation Home Work Mobile    Pender Spouse [phone]   [phone]         Current Medical History  Patient Admitting Diagnosis: cervical fracture   History of Present Illness:  66 year old right-handed female history of chronic anemia, bipolar disorder/depression/fibromyalgia, ACDF 2004 C5-6 and C6-7 and 2017 C3-4 and C4-5, left total hip arthroplasty 2020, scleroderma.  Presented 12/29/2021 with noted history of fall 10/29/2021 suffering a C5-6 fracture  subluxation/cervical kyphosis with persistent neck pain and initial conservative care with failed medical management.  Underwent 3 stage anterior posterior fusion C5-6 corpectomy, posterior fusion C3-7, ACDF C3-7 12/29/2021.  Postop she developed left hand weakness consistent with a C8 radiculopathy as well as small amount of epidural fluid at C5-6.  Cervical CT demonstrated that the left T1 pedicle screw was a bit low and into the neural foramen.  Patient underwent revision of posterior cervical instrumentation 12/30/2021 per Dr. Newman Pies.  Placed in a cervical hard collar when out of bed.  Hospital course pain management.  Acute blood loss anemia 9.8 and monitored.  Tolerating a regular consistency diet.     Patient's medical record from [facility] has been reviewed by the rehabilitation admission coordinator and physician.   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 C6- C7 -hx. past fusion(bone graft used)   Cholecystitis     Chronic  pain     DDD (degenerative disc disease), lumbar 09/2015    dextroscoliosis with multilevel DDD and facet arthrosis most notable for R foraminal disc protrusion L4/5 producing severe R neural foraminal stenosis abutting R L4 nerve root, moderate spinal canal and mild lat recesss and R neural foraminal stenosis L3/4 (MRI)   Depression      bipolar depression   Disorders of porphyrin metabolism     Felon of finger of left hand 11/10/2016   Fibromyalgia     GERD (gastroesophageal reflux disease)     Headache      occasionally   Internal hemorrhoids     Interstitial cystitis 06/06/2012    hx.   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 Yes   Family History   family history includes Arthritis in her brother; CAD (age of onset: 52) in her father; Diabetes in her maternal grandmother; Esophageal cancer in her paternal grandfather; Esophageal cancer (age of onset: 20) in her father; Hypertension in her mother; Scleroderma in her mother; Stomach cancer in her father and paternal grandfather.   Current Medications   Current Facility-Administered Medications:    acetaminophen (TYLENOL) tablet 650 mg, 650 mg, Oral, Q4H PRN **OR** acetaminophen (TYLENOL) suppository 650 mg, 650 mg, Rectal, Q4H PRN, Newman Pies, MD   acetaminophen (TYLENOL) tablet 650 mg, 650 mg, Oral, Q4H PRN, 650 mg at 01/02/22 1236 **OR** acetaminophen (TYLENOL) suppository 650 mg, 650 mg, Rectal, Q4H PRN, Newman Pies, MD   ALPRAZolam Duanne Moron) tablet 0.5 mg, 0.5 mg, Oral, QHS, Newman Pies, MD, 0.5 mg at 01/06/22 2200   alum & mag hydroxide-simeth (MAALOX/MYLANTA) 200-200-20 MG/5ML suspension 30 mL, 30 mL, Oral, Q6H PRN, Newman Pies, MD   amLODipine (NORVASC) tablet 7.5 mg, 7.5 mg, Oral, Daily, Bergman, Meghan D, NP, 7.5 mg at 01/07/22 0940   ARIPiprazole (ABILIFY) tablet 5 mg, 5 mg, Oral, QHS, Newman Pies, MD, 5 mg at 01/06/22 2159   bisacodyl (DULCOLAX) suppository 10 mg, 10 mg, Rectal, Daily PRN, Newman Pies, MD   cyclobenzaprine (FLEXERIL) tablet 10 mg, 10 mg, Oral, TID PRN, Newman Pies, MD, 10 mg at 01/07/22 0953   docusate sodium (COLACE) capsule 100 mg, 100 mg, Oral,  BID, Newman Pies, MD, 100 mg at 01/07/22 0942   Eszopiclone 3 mg, 3 mg, Oral, QHS, Pham, Minh Q, RPH-CPP, 3 mg at 01/06/22 2233   ferrous sulfate tablet 325 mg, 325 mg, Oral, Q M,W,F, Newman Pies, MD, 325 mg at 01/07/22 0941   lactated ringers infusion, , Intravenous, Continuous, Newman Pies, MD, Last Rate: 10 mL/hr at 01/03/22 1427, Rate Change at 01/03/22 1427   lamoTRIgine (LAMICTAL) tablet 200 mg, 200 mg, Oral, Daily, Newman Pies, MD, 200 mg at 01/07/22 0942   lidocaine (LIDODERM) 5 % 1 patch, 1 patch, Transdermal, Q24H, Bergman, Meghan D, NP, 1 patch  at 01/07/22 0942   linaclotide (LINZESS) capsule 290 mcg, 290 mcg, Oral, Daily, Newman Pies, MD, 290 mcg at 01/07/22 0809   menthol-cetylpyridinium (CEPACOL) lozenge 3 mg, 1 lozenge, Oral, PRN, 3 mg at 01/02/22 1543 **OR** phenol (CHLORASEPTIC) mouth spray 1 spray, 1 spray, Mouth/Throat, PRN, Newman Pies, MD   morphine (MSIR) tablet 15 mg, 15 mg, Oral, Q4H PRN, Newman Pies, MD, 15 mg at 01/07/22 0953   morphine (PF) 4 MG/ML injection 4 mg, 4 mg, Intravenous, Q2H PRN, Newman Pies, MD, 4 mg at 12/31/21 2310   naloxegol oxalate (MOVANTIK) tablet 12.5 mg, 12.5 mg, Oral, Daily, Newman Pies, MD, 12.5 mg at 01/07/22 0814   ondansetron (ZOFRAN) tablet 4 mg, 4 mg, Oral, Q6H PRN **OR** ondansetron (ZOFRAN) injection 4 mg, 4 mg, Intravenous, Q6H PRN, Newman Pies, MD, 4 mg at 12/29/21 2205   pantoprazole (PROTONIX) EC tablet 40 mg, 40 mg, Oral, BID, Newman Pies, MD, 40 mg at 01/07/22 0941   pentosan polysulfate (ELMIRON) capsule 200 mg, 200 mg, Oral, BID, Newman Pies, MD, 200 mg at 01/07/22 1009   polyethylene glycol (MIRALAX / GLYCOLAX) packet 17 g, 17 g, Oral, BID, Newman Pies, MD, 17 g at 01/07/22 0810   pregabalin (LYRICA) capsule 150 mg, 150 mg, Oral, BID, Newman Pies, MD, 150 mg at 01/07/22 0941   ursodiol (ACTIGALL) capsule 300 mg, 300 mg, Oral, BID, Newman Pies, MD, 300 mg at  01/07/22 1008   zolpidem (AMBIEN) tablet 5 mg, 5 mg, Oral, QHS PRN, Newman Pies, MD   Patients Current Diet:  Diet Order                  Diet - low sodium heart healthy             Diet regular Room service appropriate? Yes; Fluid consistency: Thin  Diet effective now                       Precautions / Restrictions Precautions Precautions: Cervical, Fall Precaution Booklet Issued: Yes (comment) Precaution Comments: cervical Cervical Brace: Hard collar (can have off in bed) Restrictions Weight Bearing Restrictions: No    Has the patient had 2 or more falls or a fall with injury in the past year? Yes   Prior Activity Level Community (5-7x/wk): indepedent and retired   Prior Functional Level Self Care: Did the patient need help bathing, dressing, using the toilet or eating? Independent   Indoor Mobility: Did the patient need assistance with walking from room to room (with or without device)? Independent   Stairs: Did the patient need assistance with internal or external stairs (with or without device)? Independent   Functional Cognition: Did the patient need help planning regular tasks such as shopping or remembering to take medications? Independent   Patient Information Are you of Hispanic, Latino/a,or Spanish origin?: A. No, not of Hispanic, Latino/a, or Spanish origin What is your race?: A. White Do you need or want an interpreter to communicate with a doctor or health care staff?: 0. No   Patient's Response To:  Health Literacy and Transportation Is the patient able to respond to health literacy and transportation needs?: Yes Health Literacy - How often do you need to have someone help you when you read instructions, pamphlets, or other written material from your doctor or pharmacy?: Never In the past 12 months, has lack of transportation kept you from medical appointments or from getting medications?: No In the past 12 months, has lack of  transportation kept  you from meetings, work, or from getting things needed for daily living?: No   Development worker, international aid / [place] Devices/Equipment: Eyeglasses, Recruitment consultant Home Equipment: Conservation officer, nature (2 wheels), [employer] - single point, [employer] - built in, Geneticist, molecular   Prior Device Use: Indicate devices/aids used by the patient prior to current illness, exacerbation or injury? None of the above   Current Functional Level Cognition   Overall Cognitive Status: Within Functional Limits for tasks assessed Orientation Level: Oriented X4 General Comments: pt able to recall all precautions and apply to mobility    Extremity Assessment (includes Sensation/Coordination)   Upper Extremity Assessment: RUE deficits/detail, LUE deficits/detail RUE Deficits / Details: Pt with 4/5 strength throughout. Continues to be limited with coordination. RUE Sensation: decreased light touch RUE Coordination: decreased fine motor LUE Deficits / Details: Pt continues to do exercises regularly. Pt is limited with function of LUE due to poor sensation. Pt drops items frequently in L hand but is determined to use this hand to improve strength and coordination. LUE Sensation: decreased light touch, decreased proprioception LUE Coordination: decreased fine motor, decreased gross motor  Lower Extremity Assessment: Defer to PT evaluation RLE Deficits / Details: Decreased coordination and proprioception noted. RLE Sensation: decreased proprioception, decreased light touch RLE Coordination: decreased gross motor, decreased fine motor LLE Deficits / Details: Grossly 4/5 at knee and 3/5 at hip. Ataxic movements and decreased coordination during heel to shin test. LLE Sensation: decreased proprioception, decreased light touch LLE Coordination: decreased gross motor, decreased fine motor     ADLs   Overall ADL's : Needs assistance/impaired Eating/Feeding: Supervision/ safety, Set up, Sitting (using red  tubing) Eating/Feeding Details (indicate cue type and reason): giving built up tubing to help with self feeding Grooming: Standing, Min guard, Wash/dry hands, Wash/dry face, Oral care Grooming Details (indicate cue type and reason): Pt stood at sink for appx 4 minutes.  HR up to 139 at times. Pt fatigues quickly in standing. Upper Body Bathing: Minimal assistance, Sitting Lower Body Bathing: Moderate assistance, Sit to/from stand Upper Body Dressing : Moderate assistance, Sitting Lower Body Dressing: Moderate assistance, Sit to/from stand, Cueing for compensatory techniques Lower Body Dressing Details (indicate cue type and reason): Pt instructed to bring legs up to her to donn socks and shoes and to start pants over legs to avoid leaning forward. Pt required assist to use LUE to pull pants over the R leg when in figure 4 position.  Pt required assist in standing to pull pants up. Pt instructed to let go of walker only with one hand at a time due to poor balance. Toilet Transfer: Minimal assistance, Rolling walker (2 wheels), Ambulation, Grab bars Toilet Transfer Details (indicate cue type and reason): Pt used RW to walk to bathroom to toilet.  Assisted requird during turns to sit on toilet due to poor balance. Toileting- Clothing Manipulation and Hygiene: Minimal assistance, Sit to/from stand, Cueing for compensatory techniques Toileting - Clothing Manipulation Details (indicate cue type and reason): Cues given to always hold to walker with one hand while managing clothing to avoid falls. Functional mobility during ADLs: Rolling walker (2 wheels), Minimal assistance General ADL Comments: Pt doing well with hand exercises and static standing tasks.  pt requires more assist with all dynamic standing tasks. Pt fatigues quickly with HR up into 130s.  Needs cues to take rest breaks. Feel rehab continues to be best option for this pt to maximize her independence and work on home skills.  Mobility    Overal bed mobility: Needs Assistance Bed Mobility: Rolling, Sidelying to Sit Rolling: Min guard Sidelying to sit: Min assist Supine to sit: Min assist Sit to sidelying: Mod assist General bed mobility comments: Pt up in chair     Transfers   Overall transfer level: Needs assistance Equipment used: None Transfers: Sit to/from Stand Sit to Stand: Min assist Bed to/from chair/wheelchair/BSC transfer type:: Stand pivot Stand pivot transfers: Min assist Step pivot transfers: Min assist General transfer comment: minG to rise, slow but stable with initial stand     Ambulation / Gait / Stairs / Wheelchair Mobility   Ambulation/Gait Ambulation/Gait assistance: Min assist, Mod assist Gait Distance (Feet): 100 Feet (+ 5 x 15 ft) Assistive device: None Gait Pattern/deviations: Step-through pattern, Decreased stride length, Narrow base of support, Ataxic, Drifts right/left, Staggering right General Gait Details: up to modA to steady with balance challenge. at times pt with too much forwards momentum and unable to control. minA to steady. Gait velocity: decreased Gait velocity interpretation: <1.31 ft/sec, indicative of household ambulator     Posture / Balance Balance Overall balance assessment: Needs assistance Sitting-balance support: No upper extremity supported Sitting balance-Leahy Scale: Fair Standing balance support: Bilateral upper extremity supported Standing balance-Leahy Scale: Poor Standing balance comment: UE support and min guard for static standing High level balance activites: Backward walking, Direction changes, Turns, Sudden stops High Level Balance Comments: challenged by short intervals of backwards walking, tandem walking, and full-body turns. increased difficulty, scissoring steps, and LOB with movement to the R     Special needs/care consideration      Previous Home Environment  Living Arrangements: Spouse/significant other  Lives With: Spouse Available Help at  Discharge: Family, Available 24 hours/day Type of Home: House Home Layout: Two level, Able to live on main level with bedroom/bathroom Home Access: Level entry Bathroom Shower/Tub: Tub/shower unit, Walk-in shower, Architectural technologist: Handicapped height Bathroom Accessibility: Yes How Accessible: Accessible via wheelchair, Accessible via [place]: No   Discharge Living Setting Plans for Discharge Living Setting: Patient's home, Lives with (comment) (spouse) Type of Home at Discharge: House Discharge Home Layout: Two level, Able to live on main level with bedroom/bathroom Discharge Home Access: Level entry Discharge Bathroom Shower/Tub: Tub/shower unit, Walk-in shower Discharge Bathroom Toilet: Handicapped height Discharge Bathroom Accessibility: Yes How Accessible: Accessible via wheelchair, Accessible via walker Does the patient have any problems obtaining your medications?: No   Social/Family/Support Systems Patient Roles: Spouse Contact Information: spouse, Shanon Brow Anticipated Caregiver: spouse, Shanon Brow Anticipated Ambulance person Information: see contacts Ability/Limitations of Caregiver: none , he is retired Public relations account executive, works some part time Careers adviser: 24/7 Discharge Plan Discussed with Primary Caregiver: Yes Is Caregiver In Agreement with Plan?: Yes Does Caregiver/Family have Issues with Lodging/Transportation while Pt is in Rehab?: No   Goals Patient/Family Goal for Rehab: Mod I to supervision with PT, supervision to min with OT Expected length of stay: ELOS 10 to 12 days Pt/Family Agrees to Admission and willing to participate: Yes Program Orientation Provided & Reviewed with Pt/Caregiver Including Roles  & Responsibilities: Yes   Decrease burden of Care through IP rehab admission: n/a   Possible need for SNF placement upon discharge: not anticipated   Patient Condition: I have reviewed medical records from [facility], spoken with  patient and spouse. I met with patient at the bedside for inpatient rehabilitation assessment.  Patient will benefit from ongoing PT and OT, can actively participate in 3 hours of therapy a  day 5 days of the week, and can make measurable gains during the admission.  Patient will also benefit from the coordinated team approach during an Inpatient Acute Rehabilitation admission.  The patient will receive intensive therapy as well as Rehabilitation physician, nursing, social worker, and care management interventions.  Due to bladder management, bowel management, safety, skin/wound care, disease management, medication administration, pain management, and patient education the patient requires 24 hour a day rehabilitation nursing.  The patient is currently min to mod assist overall with mobility and basic ADLs.  Discharge setting and therapy post discharge at home with home health is anticipated.  Patient has agreed to participate in the Acute Inpatient Rehabilitation Program and will admit today.   Preadmission Screen Completed By:  Cleatrice Burke, 01/07/2022 10:14 AM ______________________________________________________________________   Discussed status with Dr. Ranell Patrick on 01/07/22 at 1013 and received approval for admission today.   Admission Coordinator:  Cleatrice Burke, RN, time  7183 Date 01/07/22    Assessment/Plan: Diagnosis: Cervical radiculopathy and C6 cervical fracture Does the need for close, 24 hr/day Medical supervision in concert with the patient's rehab needs make it unreasonable for this patient to be served in a less intensive setting? Yes Co-Morbidities requiring supervision/potential complications: chronic insomnia, MDD, [name] syndrome, allergic rhinitis, GERD Due to bladder management, bowel management, safety, skin/wound care, disease management, medication administration, pain management, and patient education, does the patient require 24 hr/day rehab nursing?  Yes Does the patient require coordinated care of a physician, rehab nurse, PT, OT to address physical and functional deficits in the context of the above medical diagnosis(es)? Yes Addressing deficits in the following areas: balance, endurance, locomotion, strength, transferring, bowel/bladder control, bathing, dressing, feeding, grooming, toileting, and psychosocial support Can the patient actively participate in an intensive therapy program of at least 3 hrs of therapy 5 days a week? Yes The potential for patient to make measurable gains while on inpatient rehab is excellent Anticipated functional outcomes upon discharge from inpatient rehab: modified independent PT, modified independent OT, independent SLP Estimated rehab length of stay to reach the above functional goals is: 7 days Anticipated discharge destination: Home 10. Overall Rehab/Functional Prognosis: excellent     MD Signature: Leeroy Cha, MD         Revision History                                Note Details  Author Izora Ribas, MD File Time 01/07/2022 10:25 AM  Author Type Physician Status Signed  Last Editor Izora Ribas, MD Service Physical Medicine and [place] # [id number] Admit Date 01/07/2022

## 2022-01-07 NOTE — Discharge Instructions (Signed)
Inpatient Rehab Discharge Instructions  Bassheva Flury Discharge date and time: No discharge date for patient encounter.   Activities/Precautions/ Functional Status: Activity:  cervical collar Diet: regular diet Wound Care: Routine skin checks Functional status:  ___ No restrictions     ___ Walk up steps independently ___ 24/7 supervision/assistance   ___ Walk up steps with assistance ___ Intermittent supervision/assistance  ___ Bathe/dress independently ___ Walk with walker     _x__ Bathe/dress with assistance ___ Walk Independently    ___ Shower independently ___ Walk with assistance    ___ Shower with assistance ___ No alcohol     ___ Return to work/school ________  Special Instructions: No Driving smoking or alcohol   My questions have been answered and I understand these instructions. I will adhere to these goals and the provided educational materials after my discharge from the hospital.  Patient/Caregiver Signature _______________________________ Date __________  Clinician Signature _______________________________________ Date __________  Please bring this form and your medication list with you to all your follow-up doctor's appointments.

## 2022-01-07 NOTE — Progress Notes (Signed)
Inpatient Rehabilitation Admission Medication Review by a Pharmacist  A complete drug regimen review was completed for this patient to identify any potential clinically significant medication issues.  High Risk Drug Classes Is patient taking? Indication by Medication  Antipsychotic Yes Abilify- Bipolar  Anticoagulant No   Antibiotic No   Opioid Yes Morphine- acute pain  Antiplatelet No   Hypoglycemics/insulin No   Vasoactive Medication Yes Norvasc- hypertension  Chemotherapy No   Other Yes Xanax- anxiety Lamictal- bipolar depression Protonix- GERD     Type of Medication Issue Identified Description of Issue Recommendation(s)  Drug Interaction(s) (clinically significant)     Duplicate Therapy     Allergy     No Medication Administration End Date     Incorrect Dose     Additional Drug Therapy Needed     Significant med changes from prior encounter (inform family/care partners about these prior to discharge).    Other  PTA meds: Adderall XR Calcium w/ Vit D Vitamin B-12 Voltaren gel Eszopiclone- interchanged to ambien Prasterone vaginal supp Prevident 5000 toothpaste Restart PTA meds when and if necessary during CIR admission or at time of discharge, if warranted     Clinically significant medication issues were identified that warrant physician communication and completion of prescribed/recommended actions by midnight of the next day:  No  Time spent performing this drug regimen review (minutes):  30   Wenzel Backlund BS, PharmD, BCPS Clinical Pharmacist 01/07/2022 2:59 PM  Contact: [phone] after 3 PM  "Be curious, not judgmental..." -Jamal Maes

## 2022-01-07 NOTE — Progress Notes (Addendum)
Received call from bedside nurse Janett Billow regarding restating home medication of Eszopiclone 3 mg which she was taking on [facility] before transfer to CIR.  Patient unwilling to take Ambien due to side effects,  Pharmacist Narda Bonds, [facility] on night shift to d/c Ambien and restart Eszopiclone.  Pharmacy to obtain home medication from [facility] Pyxis and move to [place].

## 2022-01-07 NOTE — H&P (Signed)
Physical Medicine and Rehabilitation Admission H&P  CC: C6 fracture and cervical radiculopathy  HPI: Rachel Duran is a 66 year old right-handed female history of chronic anemia, bipolar disorder/depression/fibromyalgia, ACDF 2004 C5-6 and C6-7 and 2017 C3-4 and C4-5, left total hip arthroplasty 2020, scleroderma.  Per chart review patient lives with spouse.  Two-level home bed and bath on main level with level entry.  Independent prior to admission and driving.  Presented 12/29/2021 with noted history of fall 10/29/2021 suffering a C5-6 fracture subluxation/cervical kyphosis with persistent neck pain and initial conservative care with failed medical management.  Underwent 3 stage anterior posterior fusion C5-6 corpectomy, posterior fusion C3-7, ACDF C3-7 12/29/2021.  Postop she developed left hand weakness consistent with a C8 radiculopathy as well as small amount of epidural fluid at C5-6.  Cervical CT demonstrated that the left T1 pedicle screw was a bit low and into the neural foramen.  Patient underwent revision of posterior cervical instrumentation 12/30/2021 per Dr. Newman Pies.  Placed in a cervical hard collar when out of bed.  Hospital course pain management.  Acute blood loss anemia 9.8 and monitored.  Tolerating a regular consistency diet.  Therapy evaluations completed due to patient decreased functional mobility was admitted for a comprehensive rehab program. Currently complains of left hand weakness.   Review of Systems  Constitutional:  Negative for chills and fever.  HENT:  Negative for hearing loss.   Eyes:  Negative for blurred vision and double vision.  Respiratory:  Negative for cough.   Cardiovascular:  Negative for chest pain, palpitations and leg swelling.  Gastrointestinal:  Positive for constipation. Negative for heartburn, nausea and vomiting.       GERD  Genitourinary:  Positive for urgency. Negative for dysuria, flank pain and hematuria.  Musculoskeletal:  Positive  for joint pain, myalgias and neck pain.  Skin:  Negative for rash.  Neurological:  Positive for headaches.  Psychiatric/Behavioral:  Positive for depression.        Anxiety/bipolar disorder  All other systems reviewed and are negative.  Past Medical History:  Diagnosis Date   Abdominal pain last 4 months   and nausea also   Allergy    Anemia    history of   Anxiety    Bipolar disorder (HCC)    atpical bipolar disorder   Cervical disc disease limited rom turning to left   hx. C6- C7 -hx. past fusion(bone graft used)   Cholecystitis    Chronic pain    DDD (degenerative disc disease), lumbar 09/2015   dextroscoliosis with multilevel DDD and facet arthrosis most notable for R foraminal disc protrusion L4/5 producing severe R neural foraminal stenosis abutting R L4 nerve root, moderate spinal canal and mild lat recesss and R neural foraminal stenosis L3/4 (MRI)   Depression    bipolar depression   Disorders of porphyrin metabolism    [place] of finger of left hand 11/10/2016   Fibromyalgia    GERD (gastroesophageal reflux disease)    Headache    occasionally   Internal hemorrhoids    Interstitial cystitis 06/06/2012   hx.   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 (Dr Baird Cancer retinologist)   Tuberculosis    Past Surgical History:  Procedure Laterality Date   ANTERIOR CERVICAL DECOMP/DISCECTOMY FUSION  2004   C5/6, C6/7   ANTERIOR CERVICAL DECOMP/DISCECTOMY FUSION  02/2016   C3/4, C4/5 with plating [name])   ANTERIOR CERVICAL DECOMP/DISCECTOMY FUSION  12/29/2021    Procedure: ANTERIOR CERVICAL DECOMPRESSION/ DISCECTOMY FUSION CERVICAL THREE - SEVEN;  Surgeon: Newman Pies, MD;  Location: [place];  Service: Neurosurgery;;   ANTERIOR CERVICAL DECOMPRESSION/DISCECTOMY FUSION 4 LEVELS N/A 12/29/2021   Procedure: CERVICAL FIVE-SIX CORPECTOMY,;  Surgeon: Newman Pies, MD;  Location: [place];  Service: Neurosurgery;  Laterality: N/A;   AUGMENTATION MAMMAPLASTY Bilateral 03/25/2010   BREAST ENHANCEMENT SURGERY  2010   BREAST IMPLANT EXCHANGE  10/2014   exchange saline implants, B mastopexy/capsulorraphy ([facility])   BUNIONECTOMY Bilateral yrs ago   [place]   x 1   CHOLECYSTECTOMY  06/11/2012   Procedure: LAPAROSCOPIC CHOLECYSTECTOMY WITH INTRAOPERATIVE CHOLANGIOGRAM;  Surgeon: [name], MD,FACS;  Location: [facility];  Service: General;  Laterality: N/A;   COLONOSCOPY  02/2018   done for positive cologuard - WNL, rpt 10 yrs Fuller Plan)   CYSTOSCOPY     ERCP  05/22/2012   Procedure: ENDOSCOPIC RETROGRADE CHOLANGIOPANCREATOGRAPHY (ERCP);  Surgeon: [name], MD,FACG;  Location: [facility];  Service: Endoscopy;  Laterality: N/A;   ERCP N/A 09/17/2013   Procedure: ENDOSCOPIC RETROGRADE CHOLANGIOPANCREATOGRAPHY (ERCP);  Surgeon: [name], MD;  Location: [facility];  Service: Endoscopy;  Laterality: N/A;   ERCP N/A 09/27/2018   Procedure: ENDOSCOPIC RETROGRADE CHOLANGIOPANCREATOGRAPHY (ERCP);  Surgeon: [name], MD;  Location: [facility];  Service: Endoscopy;  Laterality: N/A;   ESOPHAGOGASTRODUODENOSCOPY  09/2016   WNL. esophagus dilated Fuller Plan)   HEMORRHOID BANDING  09/23/2013   --Dr. Greer Pickerel   HERNIA REPAIR     inguinal   HYSTEROSCOPY W/ ENDOMETRIAL ABLATION     KNEE ARTHROSCOPY Right 04/06/2021   Procedure: RIGHT KNEE ARTHROSCOPY WITH DEBRIDEMENT;  Surgeon: [name], MD;  Location: [place];  Service: Orthopedics;  Laterality: Right;   NASAL SINUS SURGERY     x5   PARTIAL KNEE  ARTHROPLASTY Right 06/01/2020   Procedure: Right knee patellofemoral replacement;  Surgeon: [name], MD;  Location: [place];  Service: Orthopedics;  Laterality: Right;   POSTERIOR CERVICAL FUSION/FORAMINOTOMY N/A 12/29/2021   Procedure: POSTERIOR CERVICAL FUSION CERVICAL THREE-SEVEN;  Surgeon: Newman Pies, MD;  Location: [place];  Service: Neurosurgery;  Laterality: N/A;   REMOVAL OF STONES  09/27/2018   Procedure: REMOVAL OF STONES;  Surgeon: [name], MD;  Location: [facility];  Service: Endoscopy;;   SPHINCTEROTOMY  09/27/2018   Procedure: SPHINCTEROTOMY;  Surgeon: [name], MD;  [facility];  Service: Endoscopy;;   TONSILLECTOMY     removed as a child   TOTAL HIP ARTHROPLASTY Left 03/12/2019   Procedure: LEFT TOTAL HIP ARTHROPLASTY ANTERIOR APPROACH;  [name], [name], MD)   UPPER GASTROINTESTINAL ENDOSCOPY     Family History  Problem Relation Age of Onset   CAD Father 58       MI, nonsmoker   Esophageal cancer Father 32   Stomach cancer Father    Scleroderma Mother    Hypertension Mother    Esophageal cancer Paternal Grandfather    Stomach cancer Paternal Grandfather  Diabetes Maternal Grandmother    Arthritis Brother    Stroke Neg Hx    Colon cancer Neg Hx    Rectal cancer Neg Hx    Social History:  reports that she has never smoked. She has never been exposed to tobacco smoke. She has never used smokeless tobacco. She reports that she does not drink alcohol and does not use drugs. Allergies:  Allergies  Allergen Reactions   Celebrex [Celecoxib] Rash    Headaches   Inh [Isoniazid] Other (See Comments)    Hepatitis   Cymbalta [Duloxetine Hcl] Other (See Comments)    Urinary retention   Nucynta [Tapentadol] Other (See Comments)    Agitation, jerking in legs   Silenor [Doxepin Hcl] Other (See Comments)    nightmare, dizziness, stumbling upon walking, not effective   Benzoin Dermatitis   Cephalexin  Nausea And Vomiting   Fentanyl Other (See Comments)    Cold intolerance, arms twitching, insomnia, fatigue   Codeine Phosphate Nausea And Vomiting and Other (See Comments)    Severe n&v   Lithium Other (See Comments)    Sores in mouth    Meperidine Hcl Rash and Other (See Comments)    REACTION: red skin   Sulfamethoxazole Rash   Medications Prior to Admission  Medication Sig Dispense Refill   ALPRAZolam (XANAX) 1 MG tablet Take 0.5 mg by mouth at bedtime.  0   amLODipine (NORVASC) 2.5 MG tablet TAKE ONE TABLET BY MOUTH DAILY **TAKE WITH '5MG'$  TABLET FOR DOSE OF 7.'5MG'$  DAILY** 90 tablet 0   amLODipine (NORVASC) 5 MG tablet Take 1 tablet (5 mg total) by mouth daily. 90 tablet 0   amoxicillin (AMOXIL) 500 MG tablet Take 4 tablets (2,'000mg'$ ) by mouth 30-60 minutes prior to dental cleaning. 4 tablet 0   amoxicillin-clavulanate (AUGMENTIN) 875-125 MG tablet Take 1 tablet by mouth 2 (two) times daily. (Patient not taking: Reported on 12/28/2021) 20 tablet 0   amphetamine-dextroamphetamine (ADDERALL XR) 30 MG 24 hr capsule TAKE 1 CAPSUEL BY MOUTH EACH MORNING 90 capsule 0   ARIPiprazole (ABILIFY) 5 MG tablet Take 5 mg by mouth at bedtime.     aspirin 81 MG chewable tablet Chew 1 tablet (81 mg total) by mouth daily. (Patient not taking: Reported on 12/28/2021) 30 tablet 0   Calcium Carbonate-Vitamin D (CALTRATE 600+D PO) Take 1 tablet by mouth daily.     cyanocobalamin (,VITAMIN B-12,) 1000 MCG/ML injection INJECT 1ML INTO THE MUSCLE EVERY 21 DAYSAS DIRECTED 3 mL 3   Eszopiclone 3 MG TABS TAKE 1 TABLET BY MOUTH AT BEDTIME TAKE IMMEDIATLEY BEFORE BEDTIME AS DIRECTED. 90 tablet 0   ferrous sulfate 325 (65 FE) MG tablet Take 325 mg by mouth every Monday, Wednesday, and Friday.      fluticasone (FLONASE) 50 MCG/ACT nasal spray PLACE 2 SPRAYS INTO BOTH NOSTRILS DAILY AS NEEDED (SEASONAL ALLERGIES) 16 g 0   lamoTRIgine (LAMICTAL) 200 MG tablet Take 200 mg by mouth daily.     linaclotide (LINZESS) 290 MCG CAPS  capsule Take 1 capsule (290 mcg total) by mouth daily. 90 capsule 1   morphine (MSIR) 15 MG tablet TAKE 1 TABLET BY MOUTH THREE TIMES DAILYAS NEEDED FOR SEVERE OR MODERATE PAIN 90 tablet 0   MOVANTIK 25 MG TABS tablet TAKE ONE TABLET BY MOUTH EVERY DAY 90 tablet 1   Multiple Vitamins-Minerals (PRESERVISION AREDS 2 PO) Take 1 capsule by mouth in the morning and at bedtime.     ondansetron (ZOFRAN) 4 MG tablet TAKE ONE  TABLET EVERY EIGHT HOURS AS NEEDED FOR NAUSEA / VOMITING 30 tablet 0   pantoprazole (PROTONIX) 40 MG tablet TAKE ONE TABLET TWICE A DAY WITH MEALS 180 tablet 0   pentosan polysulfate (ELMIRON) 100 MG capsule Take 200 mg by mouth 2 (two) times daily.      polyethylene glycol (MIRALAX / GLYCOLAX) 17 g packet Take 17 g by mouth 2 (two) times daily.     Prasterone (INTRAROSA) 6.5 MG INST Place 1 suppository vaginally at bedtime. 84 each 3   pregabalin (LYRICA) 150 MG capsule TAKE 1 CAPSULE BY MOUTH TWICE DAILY 180 capsule 0   PREVIDENT 5000 BOOSTER PLUS 1.1 % PSTE Place 1 application. onto teeth at bedtime.     sodium chloride (OCEAN) 0.65 % SOLN nasal spray Place 1 spray into both nostrils as needed for congestion.     tiZANidine (ZANAFLEX) 2 MG tablet TAKE 1 TABLET BY MOUTH EVERY 8 HOURS AS NEEDED 30 tablet 0   ursodiol (ACTIGALL) 300 MG capsule Take 1 capsule (300 mg total) by mouth 2 (two) times daily. 180 capsule 0   Home: Home Living Family/patient expects to be discharged to:: Private residence Living Arrangements: Spouse/significant other Available Help at Discharge: Family, Available 24 hours/day Type of Home: House Home Access: Level entry Home Layout: Two level, Able to live on main level with bedroom/bathroom Bathroom Shower/Tub: Tub/shower unit, Walk-in shower, Curtain Bathroom Toilet: Handicapped height Bathroom Accessibility: Yes Home Equipment: Conservation officer, nature (2 wheels), [employer] - single point, Civil engineer, contracting - built in, Geneticist, molecular  Lives With: Spouse   Functional  History: Prior Function Prior Level of Function : Independent/Modified Independent, Driving, Other (comment)   Functional Status:  Mobility: Bed Mobility Overal bed mobility: Needs Assistance Bed Mobility: Rolling, Sidelying to Sit Rolling: Min guard Sidelying to sit: Min assist Supine to sit: Min assist Sit to sidelying: Mod assist General bed mobility comments: Pt up in chair Transfers Overall transfer level: Needs assistance Equipment used: None Transfers: Sit to/from Stand Sit to Stand: Min assist Bed to/from chair/wheelchair/BSC transfer type:: Stand pivot Stand pivot transfers: Min assist Step pivot transfers: Min assist General transfer comment: minG to rise, slow but stable with initial stand Ambulation/Gait Ambulation/Gait assistance: Min assist, Mod assist Gait Distance (Feet): 100 Feet (+ 5 x 15 ft) Assistive device: None Gait Pattern/deviations: Step-through pattern, Decreased stride length, Narrow base of support, Ataxic, Drifts right/left, Staggering right General Gait Details: up to modA to steady with balance challenge. at times pt with too much forwards momentum and unable to control. minA to steady. Gait velocity: decreased Gait velocity interpretation: <1.31 ft/sec, indicative of household ambulator   ADL: ADL Overall ADL's : Needs assistance/impaired Eating/Feeding: Supervision/ safety, Set up, Sitting (using red tubing) Eating/Feeding Details (indicate cue type and reason): giving built up tubing to help with self feeding Grooming: Standing, Min guard, Wash/dry hands, Wash/dry face, Oral care Grooming Details (indicate cue type and reason): Pt stood at sink for appx 4 minutes.  HR up to 139 at times. Pt fatigues quickly in standing. Upper Body Bathing: Minimal assistance, Sitting Lower Body Bathing: Moderate assistance, Sit to/from stand Upper Body Dressing : Moderate assistance, Sitting Lower Body Dressing: Moderate assistance, Sit to/from stand, Cueing  for compensatory techniques Lower Body Dressing Details (indicate cue type and reason): Pt instructed to bring legs up to her to donn socks and shoes and to start pants over legs to avoid leaning forward. Pt required assist to use LUE to pull pants over the R leg  when in figure 4 position.  Pt required assist in standing to pull pants up. Pt instructed to let go of walker only with one hand at a time due to poor balance. Toilet Transfer: Minimal assistance, Rolling walker (2 wheels), Ambulation, Grab bars Toilet Transfer Details (indicate cue type and reason): Pt used RW to walk to bathroom to toilet.  Assisted requird during turns to sit on toilet due to poor balance. Toileting- Clothing Manipulation and Hygiene: Minimal assistance, Sit to/from stand, Cueing for compensatory techniques Toileting - Clothing Manipulation Details (indicate cue type and reason): Cues given to always hold to walker with one hand while managing clothing to avoid falls. Functional mobility during ADLs: Rolling walker (2 wheels), Minimal assistance General ADL Comments: Pt doing well with hand exercises and static standing tasks.  pt requires more assist with all dynamic standing tasks. Pt fatigues quickly with HR up into 130s.  Needs cues to take rest breaks. Feel rehab continues to be best option for this pt to maximize her independence and work on home skills.   Cognition: Cognition Overall Cognitive Status: Within Functional Limits for tasks assessed Orientation Level: Oriented X4 Cognition Arousal/Alertness: Awake/alert Behavior During Therapy: WFL for tasks assessed/performed Overall Cognitive Status: Within Functional Limits for tasks assessed General Comments: pt able to recall all precautions and apply to mobility  Physical Exam: Blood pressure 116/75, pulse (!) 105, temperature 99 F (37.2 C), resp. rate 16, height '5\' 3"'$  (1.6 m), last menstrual period 07/25/1998, SpO2 99 %. Gen: no distress, normal  appearing Neck:     Comments: Cervical collar in place, anterior neck incision with steri-strips Cardio: Reg rate Chest: normal effort, normal rate of breathing Abd: soft, non-distended Ext: no edema Psych: pleasant, normal affect Skin: anterior neck incision with steri-strips Neurological:     Comments: Patient is alert.  Oriented x3 and follows commands. 5/5 strength throughout with the exception of left hand grip 4/5.   No results found. However, due to the size of the patient record, not all encounters were searched. Please check Results Review for a complete set of results. No results found.    Blood pressure 116/75, pulse (!) 105, temperature 99 F (37.2 C), resp. rate 16, height '5\' 3"'$  (1.6 m), last menstrual period 07/25/1998, SpO2 99 %.  Medical Problem List and Plan: 1. Functional deficits secondary to C5-6 fracture status post 3 staged anterior posterior fusion C5-6 corpectomy, posterior fusion C3-7, ACDF C3-7 12/29/2021 with postoperative left hand weakness consistent with C8 radiculopathy status post revision posterior cervical instrumentation 12/30/2021 for misplacement of thoracic pedicle screw.  Cervical collar when out of bed.  -patient may shower  -ELOS/Goals: 7 days  Admit to CIR  D/c IV on admission  Will order grounds pass 2.  Antithrombotics: -DVT/anticoagulation:  Mechanical: Antiembolism stockings, thigh (TED hose) Bilateral lower extremities.  Check vascular study  -antiplatelet therapy: N/A 3. Pain Management: Lidoderm patch, Lyrica 150 mg twice daily, Flexeril as needed, MS IR 15 mg every 4 hours as needed 4. Mood/Sleep/bipolar disorder: Xanax 0.5 mg nightly,Escopiclone 3 mg nightly, Lamictal 200 mg daily,  -antipsychotic agents: Abilify 5 mg nightly 5. Neuropsych/cognition: This patient is capable of making decisions on her own behalf. 6. Skin/Wound Care: Routine skin checks 7. Fluids/Electrolytes/Nutrition: Routine in and outs with follow-up chemistries 8.   Constipation.  MiraLAX twice daily, Movantik 12.5 mg daily, Linzess 290 mcg daily, Colace 100 mg twice daily 9.  Hypertension.  Norvasc 7.5 mg daily.  Monitor with increased mobility 10.  Chronic anemia.  Continue iron supplement.  Follow-up CBC 11.  Interstitial cystitis: continue Elmiron 200 mg twice daily 12.  GERD.  Continue Protonix 13.  Cholecystitis gallstones.  Continue Actigall 300 mg twice daily  I have personally performed a face to face diagnostic evaluation, including, but not limited to relevant history and physical exam findings, of this patient and developed relevant assessment and plan.  Additionally, I have reviewed and concur with the physician assistant's documentation above.  Leeroy Cha, MD, ABPMR   Lavon Paganini Angiulli, PA-C

## 2022-01-07 NOTE — Progress Notes (Signed)
Admitted with surgical in

## 2022-01-08 ENCOUNTER — Encounter (HOSPITAL_COMMUNITY): Payer: Self-pay | Admitting: Physical Medicine and Rehabilitation

## 2022-01-08 DIAGNOSIS — M4322 Fusion of spine, cervical region: Secondary | ICD-10-CM

## 2022-01-08 DIAGNOSIS — I10 Essential (primary) hypertension: Secondary | ICD-10-CM

## 2022-01-08 DIAGNOSIS — G4700 Insomnia, unspecified: Secondary | ICD-10-CM

## 2022-01-08 DIAGNOSIS — D649 Anemia, unspecified: Secondary | ICD-10-CM

## 2022-01-08 DIAGNOSIS — K5900 Constipation, unspecified: Secondary | ICD-10-CM

## 2022-01-08 DIAGNOSIS — M5412 Radiculopathy, cervical region: Secondary | ICD-10-CM

## 2022-01-08 NOTE — Progress Notes (Signed)
Occupational Therapy Session Note  Patient Details  Name: Rachel Duran MRN: [id number] Date of Birth: 1956-02-14  Today's Date: 01/08/2022 OT Individual Time: 2585-2778 OT Individual Time Calculation (min): 60 min    Short Term Goals: Week 1:  OT Short Term Goal 1 (Week 1): STG = LTG 2/2 ELOS  Skilled Therapeutic Interventions/Progress Updates:  Upon OT arrival, pt seated in recliner. Pt reports 3/10 pain to shoulder blades and L UE. Pt agreeable to OT session. OT intervention with a focus on self care retraining, functional mobility, ROM, and education to improve sensation and fine motor coordination. Pt completes shower ADL at the levels below. Pt ambulates to bathroom with RW and CGA and to retrieve glasses with RW and CGA with no observed LOB throughout session. Pt sits in recliner after ADL portion to complete ROM exercises including shoulder flexion for 3x10 reps at a slow and controlled pace. Verbal cues required for correct form. Pt requires rest breaks between trials. Pt able to retrieve phone and text family members without dropping phone but did require increased time to complete. Pt was educated on WB techniques, rubbing L hand with foam, finger taps, and use of built up foam handle to improve use of L UE during functional tasks. Pt very appreciative. Pt limited by weakness, decreased sensation, and ROM but continues to benefit from OT services to achieve highest level of independence. Pt left in recliner with all safety measures in place.  Therapy Documentation Precautions:  Precautions Precautions: Cervical, Fall Precaution Comments: cervical Required Braces or Orthoses: Cervical Brace Cervical Brace: Hard collar Restrictions Weight Bearing Restrictions: No   ADL: Grooming: Supervision/safety Where Assessed-Grooming: Standing at sink Upper Body Bathing: Supervision/safety Where Assessed-Upper Body Bathing: Shower Lower Body Bathing: Minimal assistance Where  Assessed-Lower Body Bathing: Shower Upper Body Dressing: Moderate assistance Where Assessed-Upper Body Dressing: Other (Comment) (shower chair) Lower Body Dressing: Supervision/safety Where Assessed-Lower Body Dressing: Other (Comment) (shower chair and chair) Walk-In Shower Transfer: Curator Method: Heritage manager: Shower seat without back   Therapy/Group: Individual Therapy  Marvetta Gibbons 01/08/2022, 4:49 PM

## 2022-01-08 NOTE — Progress Notes (Signed)
PROGRESS NOTE   Subjective/Complaints: She was restarted on lunesta last night. No new concerns.    Review of Systems  Respiratory:  Negative for shortness of breath.   Cardiovascular:  Negative for chest pain.  Gastrointestinal:  Negative for abdominal pain and nausea.  Musculoskeletal:  Positive for neck pain.  Neurological:  Positive for weakness.     Objective:   VAS [place] LOWER EXTREMITY VENOUS (DVT)  Result Date: 01/08/2022  Lower Venous DVT Study Patient Name:  Rachel Duran [facility]  Date of Exam:   01/07/2022 Medical Rec #: [id number]              Accession #:    [id number] Date of Birth: 1956/01/25               Patient Gender: F Patient Age:   66 years Exam Location:  [facility] Procedure:      VAS [place] LOWER EXTREMITY VENOUS (DVT) Referring Phys: Lauraine Rinne --------------------------------------------------------------------------------  Indications: Swelling.  Comparison Study: No previous exam noted. Performing Technologist: Bobetta Lime  Examination Guidelines: A complete evaluation includes B-mode imaging, spectral Doppler, color Doppler, and power Doppler as needed of all accessible portions of each vessel. Bilateral testing is considered an integral part of a complete examination. Limited examinations for reoccurring indications may be performed as noted. The reflux portion of the exam is performed with the patient in reverse Trendelenburg.  +---------+---------------+---------+-----------+----------+--------------+ RIGHT    CompressibilityPhasicitySpontaneityPropertiesThrombus Aging +---------+---------------+---------+-----------+----------+--------------+ CFV      Full           Yes      Yes                                 +---------+---------------+---------+-----------+----------+--------------+ SFJ      Full                                                         +---------+---------------+---------+-----------+----------+--------------+ FV Prox  Full                                                        +---------+---------------+---------+-----------+----------+--------------+ FV Mid   Full                                                        +---------+---------------+---------+-----------+----------+--------------+ FV DistalFull                                                        +---------+---------------+---------+-----------+----------+--------------+  PFV      Full                                                        +---------+---------------+---------+-----------+----------+--------------+ POP      Full           Yes      Yes                                 +---------+---------------+---------+-----------+----------+--------------+ PTV      Full                                                        +---------+---------------+---------+-----------+----------+--------------+ PERO     Full                                                        +---------+---------------+---------+-----------+----------+--------------+   +---------+---------------+---------+-----------+----------+--------------+ LEFT     CompressibilityPhasicitySpontaneityPropertiesThrombus Aging +---------+---------------+---------+-----------+----------+--------------+ CFV      Full           Yes      Yes                                 +---------+---------------+---------+-----------+----------+--------------+ SFJ      Full                                                        +---------+---------------+---------+-----------+----------+--------------+ FV Prox  Full                                                        +---------+---------------+---------+-----------+----------+--------------+ FV Mid   Full                                                         +---------+---------------+---------+-----------+----------+--------------+ FV DistalFull                                                        +---------+---------------+---------+-----------+----------+--------------+ PFV      Full                                                        +---------+---------------+---------+-----------+----------+--------------+  POP      Full           Yes      Yes                                 +---------+---------------+---------+-----------+----------+--------------+ PTV      Full                                                        +---------+---------------+---------+-----------+----------+--------------+ PERO     Full                                                        +---------+---------------+---------+-----------+----------+--------------+     Summary: BILATERAL: - No evidence of deep vein thrombosis seen in the lower extremities, bilaterally. -No evidence of popliteal cyst, bilaterally.   *See table(s) above for measurements and observations. Electronically signed by Jamelle Haring on 01/08/2022 at 12:20:22 PM.    Final    No results for input(s): "WBC", "HGB", "HCT", "PLT" in the last 72 hours. No results for input(s): "NA", "K", "CL", "CO2", "GLUCOSE", "BUN", "CREATININE", "CALCIUM" in the last 72 hours.  Intake/Output Summary (Last 24 hours) at 01/08/2022 1756 Last data filed at 01/08/2022 1307 Gross per 24 hour  Intake 480 ml  Output --  Net 480 ml        Physical Exam: Vital Signs Blood pressure 127/79, pulse (!) 107, temperature 98.8 F (37.1 C), resp. rate 17, height 5' 2.99" (1.6 m), weight 62.5 kg, last menstrual period 07/25/1998, SpO2 100 %. Physical Exam: Gen: no distress, normal appearing Neck:     Comments: Cervical collar in place, anterior neck incision with steri-strips Cardio: Reg rate Chest: CTAB, normal effort, normal rate of breathing Abd: soft, non-distended Ext: no edema Psych:  pleasant, normal affect Skin: anterior neck incision with steri-strips Neurological:     Comments: Patient is alert.  Oriented x3 and follows commands. 5/5 strength throughout with the exception of left hand grip 4/5.     Assessment/Plan: 1. Functional deficits which require 3+ hours per day of interdisciplinary therapy in a comprehensive inpatient rehab setting. Physiatrist is providing close team supervision and 24 hour management of active medical problems listed below. Physiatrist and rehab team continue to assess barriers to discharge/monitor patient progress toward functional and medical goals  Care Tool:  Bathing  Bathing activity did not occur: Refused (pt already dressed) Body parts bathed by patient: Right arm, Left arm, Chest, Abdomen, Right upper leg, Left upper leg, Face   Body parts bathed by helper: Right lower leg, Left lower leg     Bathing assist Assist Level: Minimal Assistance - Patient > 75%     Upper Body Dressing/Undressing Upper body dressing   What is the patient wearing?: Pull over shirt, Orthosis Orthosis activity level: Performed by helper  Upper body assist Assist Level: Moderate Assistance - Patient 50 - 74%    Lower Body Dressing/Undressing Lower body dressing      What is the patient wearing?: Pants, Underwear/pull up     Lower body assist Assist for lower body dressing: Supervision/Verbal  cueing     Toileting Toileting Toileting Activity did not occur (Clothing management and hygiene only): N/A (no void or bm)  Toileting assist Assist for toileting: Contact Guard/Touching assist     Transfers Chair/bed transfer  Transfers assist     Chair/bed transfer assist level: Contact Guard/Touching assist     Locomotion Ambulation   Ambulation assist      Assist level: Moderate Assistance - Patient 50 - 74% Assistive device: No Device Max distance: 150'   Walk 10 feet activity   Assist     Assist level: Contact  Guard/Touching assist Assistive device: No Device   Walk 50 feet activity   Assist    Assist level: Moderate Assistance - Patient - 50 - 74% Assistive device: No Device    Walk 150 feet activity   Assist    Assist level: Moderate Assistance - Patient - 50 - 74% Assistive device: No Device    Walk 10 feet on uneven surface  activity   Assist     Assist level: Minimal Assistance - Patient > 75%     Wheelchair     Assist Is the patient using a wheelchair?: No             Wheelchair 50 feet with 2 turns activity    Assist            Wheelchair 150 feet activity     Assist          Blood pressure 127/79, pulse (!) 107, temperature 98.8 F (37.1 C), resp. rate 17, height 5' 2.99" (1.6 m), weight 62.5 kg, last menstrual period 07/25/1998, SpO2 100 %.  Medical Problem List and Plan: 1. Functional deficits secondary to C5-6 fracture status post 3 staged anterior posterior fusion C5-6 corpectomy, posterior fusion C3-7, ACDF C3-7 12/29/2021 with postoperative left hand weakness consistent with C8 radiculopathy status post revision posterior cervical instrumentation 12/30/2021 for misplacement of thoracic pedicle screw.  Cervical collar when out of bed.             -patient may shower             -ELOS/Goals: 7 days             Admit to CIR             PT/OT evals 2.  Antithrombotics: -DVT/anticoagulation:  Mechanical: Antiembolism stockings, thigh (TED hose) Bilateral lower extremities.  Check vascular study             -antiplatelet therapy: N/A 3. Pain Management: Lidoderm patch, Lyrica 150 mg twice daily, Flexeril as needed, MS IR 15 mg every 4 hours as needed 4. Mood/Sleep/bipolar disorder: Xanax 0.5 mg nightly,Escopiclone 3 mg nightly, Lamictal 200 mg daily,             -antipsychotic agents: Abilify 5 mg nightly 5. Neuropsych/cognition: This patient is capable of making decisions on her own behalf. 6. Skin/Wound Care: Routine skin checks 7.  Fluids/Electrolytes/Nutrition: Routine in and outs with follow-up chemistries 8.  Constipation.  MiraLAX twice daily, Movantik 12.5 mg daily, Linzess 290 mcg daily, Colace 100 mg twice daily  6/17 BM today, continue to follow 9.  Hypertension.  Norvasc 7.5 mg daily.  Monitor with increased mobility  -BP well controlled, follow 10.  Chronic anemia.  Continue iron supplement.  Follow-up CBC, last HGB 9.8 11.  Interstitial cystitis: continue Elmiron 200 mg twice daily 12.  GERD.  Continue Protonix 13.  Cholecystitis gallstones.  Continue Actigall 300 mg twice  daily 14. Insomnia  -Cont home lunesta  LOS: 1 days A FACE TO FACE EVALUATION WAS PERFORMED  Jennye Boroughs 01/08/2022, 5:56 PM

## 2022-01-08 NOTE — Evaluation (Signed)
Occupational Therapy Assessment and Plan  Patient Details  Name: Rachel Duran MRN: [id number] Date of Birth: 1955-11-30  OT Diagnosis: acute pain, ataxia, and muscle weakness (generalized) Rehab Potential: Rehab Potential (ACUTE ONLY): Excellent ELOS: 5-7 days   Today's Date: 01/08/2022 OT Individual Time: 1110-1203 OT Individual Time Calculation (min): 53 min     Hospital Problem: Principal Problem:   Radiculopathy   Past Medical History:  Past Medical History:  Diagnosis Date   Abdominal pain last 4 months   and nausea also   Allergy    Anemia    history of   Anxiety    Bipolar disorder ([place])    atpical bipolar disorder   Cervical disc disease limited rom turning to left   hx. C6- C7 -hx. past fusion(bone graft used)   Cholecystitis    Chronic pain    DDD (degenerative disc disease), lumbar 09/2015   dextroscoliosis with multilevel DDD and facet arthrosis most notable for R foraminal disc protrusion L4/5 producing severe R neural foraminal stenosis abutting R L4 nerve root, moderate spinal canal and mild lat recesss and R neural foraminal stenosis L3/4 (MRI)   Depression    bipolar depression   Disorders of porphyrin metabolism    Felon of finger of left hand 11/10/2016   Fibromyalgia    GERD (gastroesophageal reflux disease)    Headache    occasionally   Internal hemorrhoids    Interstitial cystitis 06/06/2012   hx.   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 (Dr Baird Cancer retinologist)   Tuberculosis    Past Surgical History:  Past Surgical  History:  Procedure Laterality Date   ANTERIOR CERVICAL DECOMP/DISCECTOMY FUSION  2004   C5/6, C6/7   ANTERIOR CERVICAL DECOMP/DISCECTOMY FUSION  02/2016   C3/4, C4/5 with plating [name])   ANTERIOR CERVICAL DECOMP/DISCECTOMY FUSION  12/29/2021   Procedure: ANTERIOR CERVICAL DECOMPRESSION/ DISCECTOMY FUSION CERVICAL THREE - SEVEN;  Surgeon: Newman Pies, MD;  Location: [place];  Service: Neurosurgery;;   ANTERIOR CERVICAL DECOMPRESSION/DISCECTOMY FUSION 4 LEVELS N/A 12/29/2021   Procedure: CERVICAL FIVE-SIX CORPECTOMY,;  Surgeon: Newman Pies, MD;  Location: [place];  Service: Neurosurgery;  Laterality: N/A;   AUGMENTATION MAMMAPLASTY Bilateral 03/25/2010   BREAST ENHANCEMENT SURGERY  2010   BREAST IMPLANT EXCHANGE  10/2014   exchange saline implants, B mastopexy/capsulorraphy ([facility])   BUNIONECTOMY Bilateral yrs ago   [place]   x 1   CHOLECYSTECTOMY  06/11/2012   Procedure: LAPAROSCOPIC CHOLECYSTECTOMY WITH INTRAOPERATIVE CHOLANGIOGRAM;  Surgeon: [name], MD,FACS;  Location: [facility];  Service: General;  Laterality: N/A;   COLONOSCOPY  02/2018   done for positive cologuard - WNL, rpt 10 yrs [facility] Plan)   CYSTOSCOPY     ERCP  05/22/2012   Procedure: ENDOSCOPIC RETROGRADE CHOLANGIOPANCREATOGRAPHY (ERCP);  Surgeon: [name], MD,FACG;  Location: [facility];  Service: Endoscopy;  Laterality: N/A;   ERCP N/A 09/17/2013   Procedure: ENDOSCOPIC RETROGRADE CHOLANGIOPANCREATOGRAPHY (ERCP);  Surgeon: [name], MD;  Location: [facility];  Service: Endoscopy;  Laterality: N/A;   ERCP N/A 09/27/2018   Procedure: ENDOSCOPIC RETROGRADE CHOLANGIOPANCREATOGRAPHY (ERCP);  Surgeon: [name], MD;  Location: [facility];  Service: Endoscopy;  Laterality: N/A;   ESOPHAGOGASTRODUODENOSCOPY  09/2016   WNL. esophagus dilated [facility] Plan)   HEMORRHOID BANDING  09/23/2013   --Dr. Greer Pickerel   HERNIA REPAIR     inguinal   HYSTEROSCOPY W/ ENDOMETRIAL ABLATION      KNEE ARTHROSCOPY Right 04/06/2021   Procedure: RIGHT KNEE ARTHROSCOPY WITH DEBRIDEMENT;  Surgeon: [name], MD;  Location: [place];  Service: Orthopedics;  Laterality: Right;   NASAL SINUS SURGERY     x5   PARTIAL KNEE ARTHROPLASTY Right 06/01/2020   Procedure: Right knee patellofemoral replacement;  Surgeon: [name], MD;  Location: [place];  Service: Orthopedics;  Laterality: Right;   POSTERIOR CERVICAL FUSION/FORAMINOTOMY N/A 12/29/2021   Procedure: POSTERIOR CERVICAL FUSION CERVICAL THREE-SEVEN;  Surgeon: Newman Pies, MD;  Location: [place];  Service: Neurosurgery;  Laterality: N/A;   REMOVAL OF STONES  09/27/2018   Procedure: REMOVAL OF STONES;  Surgeon: [name], MD;  [facility];  Service: Endoscopy;;   SPHINCTEROTOMY  09/27/2018   Procedure: SPHINCTEROTOMY;  Surgeon: [name], MD;  [facility];  Service: Endoscopy;;   TONSILLECTOMY     removed as a child   TOTAL HIP ARTHROPLASTY Left 03/12/2019   Procedure: LEFT TOTAL HIP ARTHROPLASTY ANTERIOR APPROACH;  [name], [name], MD)   UPPER GASTROINTESTINAL ENDOSCOPY      Assessment & Plan Clinical Impression: Patient is a 66 y.o. year old female with history of chronic anemia, bipolar disorder/depression/fibromyalgia, ACDF 2004 C5-6 and C6-7 and 2017 C3-4 and C4-5, left total hip arthroplasty 2020, scleroderma.  Per chart review patient lives with spouse.  Two-level home bed and bath on main level with level entry.  Independent prior to admission and driving.  Presented 12/29/2021 with noted history of fall 10/29/2021 suffering a C5-6 fracture subluxation/cervical kyphosis with persistent neck pain and initial conservative care with failed medical management.  Underwent 3 stage anterior posterior fusion C5-6 corpectomy, posterior fusion C3-7, ACDF C3-7 12/29/2021.  Postop she developed left hand weakness consistent with a C8 radiculopathy as  well as small amount of epidural fluid at C5-6.  Cervical CT demonstrated that the left T1 pedicle screw was a bit low and into the neural foramen.  Patient underwent revision of posterior cervical instrumentation 12/30/2021 per Dr. Newman Pies.  Placed in a cervical hard collar when out of bed.  Hospital course pain management.  Acute blood loss anemia 9.8 and monitored.  Tolerating a regular consistency diet.  Therapy evaluations completed due to patient decreased functional mobility was admitted for a comprehensive rehab program. Currently complains of left hand weakness.  Patient transferred to CIR on 01/07/2022 .    Patient currently requires  S to mod A  with basic self-care skills secondary to muscle weakness, ataxia and decreased coordination, and decreased standing balance, decreased balance strategies, and chronic and acute pain .  Prior to hospitalization, patient could complete BADL/IADL/mobility with independent .  Patient will benefit from skilled intervention to increase independence with basic self-care skills and increase level of independence with iADL prior to discharge home with care partner.  Anticipate patient will require intermittent supervision and follow up outpatient.  OT - End of Session Activity Tolerance: Tolerates 30+ min activity with multiple rests Endurance Deficit: Yes OT Assessment Rehab Potential (ACUTE ONLY): Excellent  OT Barriers to Discharge: Other (comments) OT Barriers to Discharge Comments: cervical precautions OT Patient demonstrates impairments in the following area(s): Balance;Endurance;Motor;Pain;Sensory OT Basic ADL's Functional Problem(s): Eating;Grooming;Bathing;Dressing;Toileting OT Advanced ADL's Functional Problem(s): Simple Meal Preparation OT Transfers Functional Problem(s): Toilet;Tub/Shower OT Additional Impairment(s): Fuctional Use of Upper Extremity OT Plan OT Intensity: Minimum of 1-2 x/day, 45 to 90 minutes OT Frequency: 5 out of 7  days OT Treatment/Interventions: Balance/vestibular training;Neuromuscular re-education;Self Care/advanced ADL retraining;Therapeutic Exercise;UE/LE Strength taining/ROM;Pain management;DME/adaptive equipment instruction;Community reintegration;Patient/family education;UE/LE Coordination activities;Therapeutic Activities;Psychosocial support;Functional mobility training;Discharge planning OT Self Feeding Anticipated Outcome(s): mod I OT Basic Self-Care Anticipated Outcome(s): mod I to S OT Toileting Anticipated Outcome(s): mod I OT Bathroom Transfers Anticipated Outcome(s): mod I, S OT Recommendation Recommendations for Other Services: Therapeutic Recreation consult Therapeutic Recreation Interventions: Pet therapy;Kitchen group;Stress management Patient destination: Home Follow Up Recommendations: Outpatient OT Equipment Recommended: To be determined   OT Evaluation Precautions/Restrictions  Precautions Precautions: Cervical;Fall Precaution Comments: cervical Required Braces or Orthoses: Cervical Brace Cervical Brace: Hard collar Restrictions Weight Bearing Restrictions: No General Chart Reviewed: Yes Response to Previous Treatment: Not applicable Family/Caregiver Present: No  Pain   , reports being premedicated, interested in K-pad order - MD notified. Home Living/Prior Functioning Home Living Family/patient expects to be discharged to:: Private residence Living Arrangements: Spouse/significant other Available Help at Discharge: Family, Available 24 hours/day Type of Home: House Home Access: Level entry Home Layout: Two level, Able to live on main level with bedroom/bathroom Bathroom Shower/Tub: Gaffer, Door [employer] Toilet: Handicapped height Bathroom Accessibility: Yes  Lives With: Spouse IADL History Homemaking Responsibilities: Yes Meal Prep Responsibility: Therapist, occupational Responsibility: Primary Cleaning Responsibility: Primary Bill Paying/Finance  Responsibility: Primary Shopping Responsibility: Primary Child Care Responsibility: Secondary Occupation: Retired Leisure and Hobbies: [place], being with grandkids Prior Function Level of Independence: Independent with basic ADLs, Independent with gait, Independent with homemaking with ambulation, Independent with transfers  Able to Take Stairs?: Yes Driving: Yes Vision Baseline Vision/History: 1 Wears glasses Ability to See in Adequate Light: 0 Adequate Patient Visual Report: No change from baseline Vision Assessment?: No apparent visual deficits Perception  Perception: Within Functional Limits Praxis Praxis: Intact Cognition Cognition Overall Cognitive Status: Within Functional Limits for tasks assessed Arousal/Alertness: Awake/alert Orientation Level: Person;Place;Situation Person: Oriented Place: Oriented Situation: Oriented Memory: Appears intact Attention: Selective Selective Attention: Appears intact Awareness: Appears intact Problem Solving: Appears intact Safety/Judgment: Appears intact Brief Interview for Mental Status (BIMS) Repetition of Three Words (First Attempt): 3 Temporal Orientation: Year: Correct Temporal Orientation: Month: Accurate within 5 days Temporal Orientation: Day: Correct Recall: "Sock": Yes, no cue required Recall: "Blue": Yes, no cue required Recall: "Bed": Yes, no cue required BIMS Summary Score: 15 Sensation Sensation Light Touch: Appears Intact Hot/Cold: Not tested Proprioception: Appears Intact Stereognosis: Appears Intact Additional Comments: reports N/T in LUE, but able to appreciate LT Coordination Gross Motor Movements are Fluid and Coordinated: No Fine Motor Movements are Fluid and Coordinated: No Coordination and Movement Description: mild LUE ataxia, limited by cervical brace/precautions Finger Nose Finger Test: mild ataxia on L 9 Hole Peg Test: L: 1 min 58 seconds, R 30 seconds Motor  Motor Motor - Skilled Clinical  Observations: mild LUE ataxia, limited by C collar/cervical precautions  Trunk/Postural Assessment  Cervical Assessment Cervical Assessment: Exceptions to WFL (C collar) Thoracic Assessment Thoracic Assessment: Within Functional Limits Lumbar Assessment Lumbar Assessment: Within Functional Limits Postural Control Postural Control: Deficits on evaluation  Balance Balance Balance Assessed: Yes Static Sitting Balance Static Sitting - Balance Support: Feet supported Static  Sitting - Level of Assistance: 6: Modified independent (Device/Increase time) Static Standing Balance Static Standing - Balance Support: No upper extremity supported Static Standing - Level of Assistance: 5: Stand by assistance Dynamic Standing Balance Dynamic Standing - Balance Support: Left upper extremity supported Dynamic Standing - Level of Assistance: 4: Min assist Extremity/Trunk Assessment RUE Assessment RUE Assessment: Within Functional Limits LUE Assessment LUE Assessment: Exceptions to [facility] General Strength Comments: mild ataxia, L grip weakness  Care Tool Care Tool Self Care Eating   Eating Assist Level: Set up assist    Oral Care    Oral Care Assist Level: Supervision/Verbal cueing    Bathing Bathing activity did not occur: Refused (pt already dressed)            Upper Body Dressing(including orthotics)   What is the patient wearing?: Pull over shirt   Assist Level: Moderate Assistance - Patient 50 - 74%    Lower Body Dressing (excluding footwear)   What is the patient wearing?: Pants Assist for lower body dressing: Supervision/Verbal cueing    Putting on/Taking off footwear   What is the patient wearing?: Non-skid slipper socks Assist for footwear: Total Assistance - Patient < 25%       Care Tool Toileting Toileting activity Toileting Activity did not occur (Clothing management and hygiene only): N/A (no void or bm)       Care Tool Bed Mobility Roll left and right activity         Sit to lying activity        Lying to sitting on side of bed activity         Care Tool Transfers Sit to stand transfer   Sit to stand assist level: Supervision/Verbal cueing (RW)    Chair/bed transfer   Chair/bed transfer assist level: Supervision/Verbal cueing (RW)     Toilet transfer   Assist Level: Supervision/Verbal cueing (RW)     Care Tool Cognition  Expression of Ideas and Wants Expression of Ideas and Wants: 4. Without difficulty (complex and basic) - expresses complex messages without difficulty and with speech that is clear and easy to understand  Understanding Verbal and Non-Verbal Content Understanding Verbal and Non-Verbal Content: 4. Understands (complex and basic) - clear comprehension without cues or repetitions   Memory/Recall Ability Memory/Recall Ability : Location of own room;Staff names and faces;That he or she is in a hospital/hospital unit;Current season   Refer to Care Plan for [place] 1 OT Short Term Goal 1 (Week 1): STG = LTG 2/2 ELOS  Recommendations for other services: Therapeutic Recreation  Pet therapy, Kitchen group, and Stress management   Skilled Therapeutic Intervention ADL ADL Eating: Set up Where Assessed-Eating: Chair Grooming: Supervision/safety Where Assessed-Grooming: Standing at sink Upper Body Bathing: Supervision/safety Where Assessed-Upper Body Bathing: Sitting at sink Lower Body Bathing: Contact guard Where Assessed-Lower Body Bathing: Standing at sink Upper Body Dressing: Moderate assistance Where Assessed-Upper Body Dressing: Sitting at sink Lower Body Dressing: Supervision/safety Where Assessed-Lower Body Dressing: Sitting at sink Toileting: Contact guard Where Assessed-Toileting: Glass blower/designer: Close supervision Toilet Transfer Method: Counselling psychologist: Grab bars;Bedside commode Tub/Shower Transfer: Not assessed Walk-In Shower Transfer: Agricultural engineer Method: Heritage manager: Shower seat without back Mobility  Bed Mobility Bed Mobility: Not assessed Transfers Sit to Stand: Supervision/Verbal cueing Stand to Sit: Supervision/Verbal cueing  Session Note: Pt received seated in recliner, C collar already donned, reports scapular/LUE pain but is premedicated, agreeable  to OT eval. Reviewed role of CIR OT, evaluation process, ADL/func mobility retraining, goals for therapy, and safety plan. Evaluation completed as documented above. Ind recalled cervical precautions when prompted.   Completed ambulatory toilet transfer with close S and use of RW. Pt already dressed for the day, but agreeable to simulate tasks. Able to don pants with close S and cues for cervical precautions. Required mod A to don shirt due to LUE pain and difficulty pulling shirt over cervical collar.   Ambulated > day room close S and RW. Practiced ambulating without RW and min HHA ~30 ft. Simulated step over shower transfer with RW and CGA + shower stool. Finally administered 9HPT with results documented above. Issued small foam cubes to target LUE FMC.   Pt left seated in recliner with chair pad alarm engaged, call bell in reach, and all immediate needs met.  Discharge Criteria: Patient will be discharged from OT if patient refuses treatment 3 consecutive times without medical reason, if treatment goals not met, if there is a change in medical status, if patient makes no progress towards goals or if patient is discharged from hospital.  The above assessment, treatment plan, treatment alternatives and goals were discussed and mutually agreed upon: by patient  Volanda Napoleon MS, OTR/L  01/08/2022, 1:00 PM

## 2022-01-08 NOTE — Plan of Care (Signed)
Problem: Sit to Stand Goal: LTG:  Patient will perform sit to stand in prep for activites of daily living with assistance level (OT) Description: LTG:  Patient will perform sit to stand in prep for activites of daily living with assistance level (OT) Flowsheets (Taken 01/08/2022 1254) LTG: PT will perform sit to stand in prep for activites of daily living with assistance level: Independent with assistive device   Problem: RH Eating Goal: LTG Patient will perform eating w/assist, cues/equip (OT) Description: LTG: Patient will perform eating with assist, with/without cues using equipment (OT) Flowsheets (Taken 01/08/2022 1254) LTG: Pt will perform eating with assistance level of: Independent with assistive device    Problem: RH Grooming Goal: LTG Patient will perform grooming w/assist,cues/equip (OT) Description: LTG: Patient will perform grooming with assist, with/without cues using equipment (OT) Flowsheets (Taken 01/08/2022 1254) LTG: Pt will perform grooming with assistance level of: Independent with assistive device    Problem: RH Bathing Goal: LTG Patient will bathe all body parts with assist levels (OT) Description: LTG: Patient will bathe all body parts with assist levels (OT) Flowsheets (Taken 01/08/2022 1254) LTG: Pt will perform bathing with assistance level/cueing: Independent with assistive device    Problem: RH Dressing Goal: LTG Patient will perform upper body dressing (OT) Description: LTG Patient will perform upper body dressing with assist, with/without cues (OT). Flowsheets (Taken 01/08/2022 1254) LTG: Pt will perform upper body dressing with assistance level of: Supervision/Verbal cueing Goal: LTG Patient will perform lower body dressing w/assist (OT) Description: LTG: Patient will perform lower body dressing with assist, with/without cues in positioning using equipment (OT) Flowsheets (Taken 01/08/2022 1254) LTG: Pt will perform lower body dressing with assistance level  of: Independent with assistive device   Problem: RH Toileting Goal: LTG Patient will perform toileting task (3/3 steps) with assistance level (OT) Description: LTG: Patient will perform toileting task (3/3 steps) with assistance level (OT)  Flowsheets (Taken 01/08/2022 1254) LTG: Pt will perform toileting task (3/3 steps) with assistance level: Independent with assistive device   Problem: RH Functional Use of Upper Extremity Goal: LTG Patient will use RT/LT upper extremity as a (OT) Description: LTG: Patient will use right/left upper extremity as a stabilizer/gross assist/diminished/nondominant/dominant level with assist, with/without cues during functional activity (OT) Flowsheets (Taken 01/08/2022 1254) LTG: Use of upper extremity in functional activities: LUE as diminished level LTG: Pt will use upper extremity in functional activity with assistance level of: Supervision/Verbal cueing   Problem: RH Simple Meal Prep Goal: LTG Patient will perform simple meal prep w/assist (OT) Description: LTG: Patient will perform simple meal prep with assistance, with/without cues (OT). Flowsheets (Taken 01/08/2022 1254) LTG: Pt will perform simple meal prep with assistance level of: Supervision/Verbal cueing   Problem: RH Toilet Transfers Goal: LTG Patient will perform toilet transfers w/assist (OT) Description: LTG: Patient will perform toilet transfers with assist, with/without cues using equipment (OT) Flowsheets (Taken 01/08/2022 1254) LTG: Pt will perform toilet transfers with assistance level of: Independent with assistive device   Problem: RH Tub/Shower Transfers Goal: LTG Patient will perform tub/shower transfers w/assist (OT) Description: LTG: Patient will perform tub/shower transfers with assist, with/without cues using equipment (OT) Flowsheets (Taken 01/08/2022 1254) LTG: Pt will perform tub/shower stall transfers with assistance level of: Supervision/Verbal cueing   Problem: RH Furniture  Transfers Goal: LTG Patient will perform furniture transfers w/assist (OT/PT) Description: LTG: Patient will perform furniture transfers  with assistance (OT/PT). Flowsheets (Taken 01/08/2022 1254) LTG: Pt will perform furniture transfers with assist:: Independent with assistive  device

## 2022-01-08 NOTE — Evaluation (Signed)
Physical Therapy Assessment and Plan  Patient Details  Name: Rachel Duran MRN: [id number] Date of Birth: 1956/05/10  PT Diagnosis: Abnormality of gait, Ataxia, Difficulty walking, and Muscle weakness Rehab Potential: Good ELOS: 7-10 Days   Today's Date: 01/08/2022 PT Individual Time: 1302-1359 PT Individual Time Calculation (min): 57 min    Hospital Problem: Principal Problem:   Radiculopathy   Past Medical History:  Past Medical History:  Diagnosis Date   Abdominal pain last 4 months   and nausea also   Allergy    Anemia    history of   Anxiety    Bipolar disorder ([place])    atpical bipolar disorder   Cervical disc disease limited rom turning to left   hx. C6- C7 -hx. past fusion(bone graft used)   Cholecystitis    Chronic pain    DDD (degenerative disc disease), lumbar 09/2015   dextroscoliosis with multilevel DDD and facet arthrosis most notable for R foraminal disc protrusion L4/5 producing severe R neural foraminal stenosis abutting R L4 nerve root, moderate spinal canal and mild lat recesss and R neural foraminal stenosis L3/4 (MRI)   Depression    bipolar depression   Disorders of porphyrin metabolism    Felon of finger of left hand 11/10/2016   Fibromyalgia    GERD (gastroesophageal reflux disease)    Headache    occasionally   Internal hemorrhoids    Interstitial cystitis 06/06/2012   hx.   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 (Dr Baird Cancer retinologist)   Tuberculosis    Past Surgical History:  Past Surgical History:  Procedure Laterality  Date   ANTERIOR CERVICAL DECOMP/DISCECTOMY FUSION  2004   C5/6, C6/7   ANTERIOR CERVICAL DECOMP/DISCECTOMY FUSION  02/2016   C3/4, C4/5 with plating [name])   ANTERIOR CERVICAL DECOMP/DISCECTOMY FUSION  12/29/2021   Procedure: ANTERIOR CERVICAL DECOMPRESSION/ DISCECTOMY FUSION CERVICAL THREE - SEVEN;  Surgeon: Newman Pies, MD;  Location: [place];  Service: Neurosurgery;;   ANTERIOR CERVICAL DECOMPRESSION/DISCECTOMY FUSION 4 LEVELS N/A 12/29/2021   Procedure: CERVICAL FIVE-SIX CORPECTOMY,;  Surgeon: Newman Pies, MD;  Location: [place];  Service: Neurosurgery;  Laterality: N/A;   AUGMENTATION MAMMAPLASTY Bilateral 03/25/2010   BREAST ENHANCEMENT SURGERY  2010   BREAST IMPLANT EXCHANGE  10/2014   exchange saline implants, B mastopexy/capsulorraphy ([facility])   BUNIONECTOMY Bilateral yrs ago   [place]   x 1   CHOLECYSTECTOMY  06/11/2012   Procedure: LAPAROSCOPIC CHOLECYSTECTOMY WITH INTRAOPERATIVE CHOLANGIOGRAM;  Surgeon: [name], MD,FACS;  Location: [facility];  Service: General;  Laterality: N/A;   COLONOSCOPY  02/2018   done for positive cologuard - WNL, rpt 10 yrs [facility] Plan)   CYSTOSCOPY     ERCP  05/22/2012   Procedure: ENDOSCOPIC RETROGRADE CHOLANGIOPANCREATOGRAPHY (ERCP);  Surgeon: [name], MD,FACG;  Location: [facility];  Service: Endoscopy;  Laterality: N/A;   ERCP N/A 09/17/2013   Procedure: ENDOSCOPIC RETROGRADE CHOLANGIOPANCREATOGRAPHY (ERCP);  Surgeon: [name], MD;  Location: [facility];  Service: Endoscopy;  Laterality: N/A;   ERCP N/A 09/27/2018   Procedure: ENDOSCOPIC RETROGRADE CHOLANGIOPANCREATOGRAPHY (ERCP);  Surgeon: [name], MD;  Location: [facility];  Service: Endoscopy;  Laterality: N/A;   ESOPHAGOGASTRODUODENOSCOPY  09/2016   WNL. esophagus dilated [facility] Plan)   HEMORRHOID BANDING  09/23/2013   --Dr. Greer Pickerel   HERNIA REPAIR     inguinal   HYSTEROSCOPY W/ ENDOMETRIAL ABLATION     KNEE ARTHROSCOPY Right  04/06/2021   Procedure: RIGHT KNEE ARTHROSCOPY WITH DEBRIDEMENT;  Surgeon: [name], MD;  Location: [place];  Service: Orthopedics;  Laterality: Right;   NASAL SINUS SURGERY     x5   PARTIAL KNEE ARTHROPLASTY Right 06/01/2020   Procedure: Right knee patellofemoral replacement;  Surgeon: [name], MD;  Location: [place];  Service: Orthopedics;  Laterality: Right;   POSTERIOR CERVICAL FUSION/FORAMINOTOMY N/A 12/29/2021   Procedure: POSTERIOR CERVICAL FUSION CERVICAL THREE-SEVEN;  Surgeon: Newman Pies, MD;  Location: [place];  Service: Neurosurgery;  Laterality: N/A;   REMOVAL OF STONES  09/27/2018   Procedure: REMOVAL OF STONES;  Surgeon: [name], MD;  [facility];  Service: Endoscopy;;   SPHINCTEROTOMY  09/27/2018   Procedure: SPHINCTEROTOMY;  Surgeon: [name], MD;  [facility];  Service: Endoscopy;;   TONSILLECTOMY     removed as a child   TOTAL HIP ARTHROPLASTY Left 03/12/2019   Procedure: LEFT TOTAL HIP ARTHROPLASTY ANTERIOR APPROACH;  [name], [name], MD)   UPPER GASTROINTESTINAL ENDOSCOPY      Assessment & Plan Clinical Impression: Patient is a 66 year old right-handed female history of chronic anemia, bipolar disorder/depression/fibromyalgia, ACDF 2004 C5-6 and C6-7 and 2017 C3-4 and C4-5, left total hip arthroplasty 2020, scleroderma.  Per chart review patient lives with spouse.  Two-level home bed and bath on main level with level entry.  Independent prior to admission and driving.  Presented 12/29/2021 with noted history of fall 10/29/2021 suffering a C5-6 fracture subluxation/cervical kyphosis with persistent neck pain and initial conservative care with failed medical management.  Underwent 3 stage anterior posterior fusion C5-6 corpectomy, posterior fusion C3-7, ACDF C3-7 12/29/2021.  Postop she developed left hand weakness consistent with a C8 radiculopathy as well as small amount of  epidural fluid at C5-6.  Cervical CT demonstrated that the left T1 pedicle screw was a bit low and into the neural foramen.  Patient underwent revision of posterior cervical instrumentation 12/30/2021 per Dr. Newman Pies.  Placed in a cervical hard collar when out of bed.  Hospital course pain management.  Acute blood loss anemia 9.8 and monitored.  Tolerating a regular consistency diet.   Patient transferred to CIR on 01/07/2022 .   Patient currently requires mod with mobility secondary to muscle weakness, decreased cardiorespiratoy endurance, ataxia, decreased coordination, and decreased motor planning, and decreased standing balance, decreased postural control, and decreased balance strategies.  Prior to hospitalization, patient was independent  with mobility and lived with Spouse in a House home.  Home access is  Level entry.  Patient will benefit from skilled PT intervention to maximize safe functional mobility, minimize fall risk, and decrease caregiver burden for planned discharge home with 24 hour supervision.  Anticipate patient will benefit from follow up OP at discharge.  PT - End of Session Activity Tolerance: Tolerates 30+ min activity with multiple rests Endurance Deficit: Yes Endurance Deficit Description: requires multiple rest breaks during mobility tasks PT Assessment Rehab Potential (ACUTE/IP ONLY): Good PT Patient demonstrates impairments in the following area(s):  Balance;Behavior;Endurance;Motor;Pain;Safety PT Transfers Functional Problem(s): Bed Mobility;Bed to Chair;Car;Furniture PT Locomotion Functional Problem(s): Ambulation;Stairs PT Plan PT Intensity: Minimum of 1-2 x/day ,45 to 90 minutes PT Frequency: 5 out of 7 days PT Duration Estimated Length of Stay: 7-10 Days PT Treatment/Interventions: Ambulation/gait training;Community reintegration;Neuromuscular re-education;DME/adaptive equipment instruction;Psychosocial support;UE/LE Strength taining/ROM;Stair  training;Balance/vestibular training;Discharge planning;Functional electrical stimulation;Pain management;Skin care/wound management;Therapeutic Activities;UE/LE Coordination activities;Cognitive remediation/compensation;Disease management/prevention;Functional mobility training;Therapeutic Exercise;Visual/perceptual remediation/compensation;Splinting/orthotics;Patient/family education PT Transfers Anticipated Outcome(s): Supervision PT Locomotion Anticipated Outcome(s): Supervision PT Recommendation Follow Up Recommendations: 24 hour supervision/assistance;Outpatient PT Patient destination: Home Equipment Recommended: To be determined   PT Evaluation Precautions/Restrictions Precautions Precautions: Cervical;Fall Precaution Comments: cervical Required Braces or Orthoses: Cervical Brace Cervical Brace: Hard collar Restrictions Weight Bearing Restrictions: No General Chart Reviewed: Yes Family/Caregiver Present: No  Pain Interference Pain Interference Pain Effect on Sleep: 1. Rarely or not at all Pain Interference with Therapy Activities: 1. Rarely or not at all Pain Interference with Day-to-Day Activities: 1. Rarely or not at all Home Living/Prior [place] Available Help at Discharge: Family;Available 24 hours/day Type of Home: House Home Access: Level entry Home Layout: Two level;Able to live on main level with bedroom/bathroom Bathroom Shower/Tub: Walk-in shower;Door [employer] Toilet: Handicapped height Bathroom Accessibility: Yes  Lives With: Spouse Prior Function Level of Independence: Independent with basic ADLs;Independent with gait;Independent with homemaking with ambulation;Independent with transfers  Able to Take Stairs?: Yes Driving: Yes Vision/Perception  Vision - History Ability to See in Adequate Light: 0 Adequate Perception Perception: Within Functional Limits Praxis Praxis: Intact  Cognition Overall Cognitive Status: Within Functional  Limits for tasks assessed Arousal/Alertness: Awake/alert Orientation Level: Oriented X4 Attention: Selective Selective Attention: Appears intact Memory: Appears intact Awareness: Appears intact Problem Solving: Appears intact Safety/Judgment: Appears intact Sensation Sensation Light Touch: Appears Intact Hot/Cold: Not tested Proprioception: Appears Intact Stereognosis: Appears Intact Additional Comments: reports N/T in LUE, but able to appreciate LT Coordination Gross Motor Movements are Fluid and Coordinated: No Fine Motor Movements are Fluid and Coordinated: No Coordination and Movement Description: mild LUE ataxia, limited by cervical brace/precautions Finger Nose Finger Test: mild ataxia on L 9 Hole Peg Test: L: 1 min 58 seconds, R 30 seconds Motor  Motor Motor: Ataxia Motor - Skilled Clinical Observations: mild LUE ataxia, limited by C collar/cervical precautions, BLE ataxic  Trunk/Postural Assessment  Cervical Assessment Cervical Assessment: Exceptions to [facility] (cervical collar) Thoracic Assessment Thoracic Assessment: Within Functional Limits Lumbar Assessment Lumbar Assessment: Within Functional Limits Postural Control Postural Control: Deficits on evaluation Righting Reactions: Delayed and inadequate  Balance Balance Balance Assessed: Yes Static Sitting Balance Static Sitting - Balance Support: Feet supported Static Sitting - Level of Assistance: 6: Modified independent (Device/Increase time) Dynamic Sitting Balance Dynamic Sitting - Balance Support: Feet supported Dynamic Sitting - Level of Assistance: 5: Stand by assistance Static Standing Balance Static Standing - Balance Support: No upper extremity supported Static Standing - Level of Assistance: 4: Min assist Dynamic Standing Balance Dynamic Standing - Balance Support: During functional activity Dynamic Standing - Level of Assistance: 4: Min assist Extremity Assessment  RUE Assessment RUE Assessment:  Within Functional Limits LUE Assessment LUE Assessment: Exceptions to [facility] General Strength Comments: mild ataxia, L grip weakness RLE Assessment RLE Assessment: Within Functional Limits General Strength Comments: Grossly 4+/5 LLE Assessment LLE Assessment: Within Functional Limits General Strength Comments: Grossly 4/5  Care Tool Care Tool Bed Mobility Roll left and right activity   Roll left and right assist level: Independent    Sit to lying activity   Sit to  lying assist level: Contact Guard/Touching assist    Lying to sitting on side of bed activity   Lying to sitting on side of bed assist level: the ability to move from lying on the back to sitting on the side of the bed with no back support.: Contact Guard/Touching assist     Care Tool Transfers Sit to stand transfer   Sit to stand assist level: Contact Guard/Touching assist    Chair/bed transfer   Chair/bed transfer assist level: Contact Guard/Touching assist     Toilet transfer   Assist Level: Supervision/Verbal cueing    Car transfer   Car transfer assist level: Minimal Assistance - Patient > 75%      Care Tool Locomotion Ambulation   Assist level: Moderate Assistance - Patient 50 - 74% Assistive device: No Device Max distance: 150'  Walk 10 feet activity   Assist level: Contact Guard/Touching assist Assistive device: No Device   Walk 50 feet with 2 turns activity   Assist level: Moderate Assistance - Patient - 50 - 74% Assistive device: No Device  Walk 150 feet activity   Assist level: Moderate Assistance - Patient - 50 - 74% Assistive device: No Device  Walk 10 feet on uneven surfaces activity   Assist level: Minimal Assistance - Patient > 75%    Stairs   Assist level: Maximal Assistance - Patient 25 - 49% Stairs assistive device: 2 hand rails Max number of stairs: 4  Walk up/down 1 step activity   Walk up/down 1 step (curb) assist level: Maximal Assistance - Patient 25 - 49% Walk up/down 1 step  or curb assistive device: 2 hand rails  Walk up/down 4 steps activity   Walk up/down 4 steps assist level: Maximal Assistance - Patient 25 - 49% Walk up/down 4 steps assistive device: 2 hand rails  Walk up/down 12 steps activity Walk up/down 12 steps activity did not occur: Safety/medical concerns      Pick up small objects from floor Pick up small object from the floor (from standing position) activity did not occur: Safety/medical concerns      Wheelchair Is the patient using a wheelchair?: No          Wheel 50 feet with 2 turns activity      Wheel 150 feet activity        Refer to Care Plan for Long Term Goals  SHORT TERM GOAL WEEK 1 PT Short Term Goal 1 (Week 1): STGs = LTGs  Recommendations for other services: None   Skilled Therapeutic Intervention  Evaluation completed (see details above and below) with education on PT POC and goals and individual treatment initiated with focus on balance, transfers, ambulation, stair training, and NMR for motor control. Pt performs sit to stand with CGA and cues for hand placement and body mechanics. Pt ambulates x15' to [facility] with CGA and cues for positioning. WC transport to gym for time management. Pt performs ramp navigation and car transfer with minA and cues for sequencing and positioning. Following, pt ambulates x150', initially with CGA, progressing to minA due to increased postural sway, and then requiring modA due to LOB. PT provides cues for increasing gait speed to decrease risk for falls, as well as maintaining upright gaze to improve balance. Pt ambulates additional 100' with improved balance and only requires CGA/minA. Pt attempts stair training and on initial step has bilateral buckling and requires maxA to prevent fall. Pt completes x4 steps with bilateral hand rails prior to taking seated rest  break. Pt then performs repeated step ups onto 4" platform with RW and PT providing cues for motor control and sequencing. Pt able to  complete x20 with alternating feet with CGA/minA. Pt then ambulates back to room with RW and CGA, with cues for posture and increasing bilateral stride length. Pt left seated in recliner with alarm intact and all needs within reach.   Mobility Bed Mobility Bed Mobility: Supine to Sit;Sit to Supine Supine to Sit: Contact Guard/Touching assist Sit to Supine: Contact Guard/Touching assist Transfers Transfers: Sit to Stand;Stand to Sit;Stand Pivot Transfers Sit to Stand: Contact Guard/Touching assist Stand to Sit: Contact Guard/Touching assist Stand Pivot Transfers: Minimal Assistance - Patient > 75% Stand Pivot Transfer Details: Verbal cues for technique;Tactile cues for initiation;Tactile cues for placement;Verbal cues for sequencing;Verbal cues for precautions/safety;Tactile cues for sequencing;Tactile cues for posture Transfer (Assistive device): None Locomotion  Gait Ambulation: Yes Gait Assistance: Contact Guard/Touching assist Gait Distance (Feet): 150 Feet Assistive device: Rolling walker Gait Gait: Yes Gait Pattern: Impaired Gait Pattern: Ataxic Gait velocity: decreased Stairs / Additional Locomotion Stairs: Yes Stairs Assistance: Maximal Assistance - Patient 25 - 49% Stair Management Technique: Two rails Number of Stairs: 4 Height of Stairs: 6 Ramp: Minimal Assistance - Patient >75% Curb: Maximal Assistance - Patient 25 - 49% Wheelchair Mobility Wheelchair Mobility: No   Discharge Criteria: Patient will be discharged from PT if patient refuses treatment 3 consecutive times without medical reason, if treatment goals not met, if there is a change in medical status, if patient makes no progress towards goals or if patient is discharged from hospital.  The above assessment, treatment plan, treatment alternatives and goals were discussed and mutually agreed upon: by patient  Breck Coons, PT, DPT 01/08/2022, 3:35 PM

## 2022-01-09 DIAGNOSIS — D72829 Elevated white blood cell count, unspecified: Secondary | ICD-10-CM

## 2022-01-09 NOTE — Progress Notes (Signed)
PROGRESS NOTE   Subjective/Complaints: Has continued mild pain at incision site. This is well controlled with her MSIR prn. NO additional concerns.   Review of Systems  Constitutional:  Negative for chills and fever.  Respiratory:  Negative for shortness of breath.   Cardiovascular:  Negative for chest pain.  Gastrointestinal:  Negative for abdominal pain and nausea.  Genitourinary: Negative.   Musculoskeletal:  Positive for neck pain.  Neurological:  Positive for weakness.     Objective:   No results found. No results for input(s): "WBC", "HGB", "HCT", "PLT" in the last 72 hours. No results for input(s): "NA", "K", "CL", "CO2", "GLUCOSE", "BUN", "CREATININE", "CALCIUM" in the last 72 hours.  Intake/Output Summary (Last 24 hours) at 01/09/2022 2233 Last data filed at 01/09/2022 1804 Gross per 24 hour  Intake 840 ml  Output --  Net 840 ml         Physical Exam: Vital Signs Blood pressure 131/72, pulse (!) 102, temperature 98.2 F (36.8 C), resp. rate 15, height 5' 2.99" (1.6 m), weight 62.5 kg, last menstrual period 07/25/1998, SpO2 100 %. Physical Exam: Gen: no distress, normal appearing Neck:     Comments: Cervical collar in place, anterior neck incision with steri-strips, no signs of infection Cardio: Reg rate Chest: CTAB, normal effort, normal rate of breathing Abd: soft, non-distended Ext: no edema Psych: pleasant, normal affect Skin: anterior neck incision with steri-strips Neurological:     Comments: Patient is alert.  Oriented x3 and follows commands. 5/5 strength throughout with the exception of left hand grip 4/5.     Assessment/Plan: 1. Functional deficits which require 3+ hours per day of interdisciplinary therapy in a comprehensive inpatient rehab setting. Physiatrist is providing close team supervision and 24 hour management of active medical problems listed below. Physiatrist and rehab team  continue to assess barriers to discharge/monitor patient progress toward functional and medical goals  Care Tool:  Bathing  Bathing activity did not occur: Refused (pt already dressed) Body parts bathed by patient: Right arm, Left arm, Chest, Abdomen, Right upper leg, Left upper leg, Face   Body parts bathed by helper: Right lower leg, Left lower leg     Bathing assist Assist Level: Minimal Assistance - Patient > 75%     Upper Body Dressing/Undressing Upper body dressing   What is the patient wearing?: Bra, Pull over shirt Orthosis activity level: Performed by helper  Upper body assist Assist Level: Minimal Assistance - Patient > 75%    Lower Body Dressing/Undressing Lower body dressing      What is the patient wearing?: Underwear/pull up, Pants     Lower body assist Assist for lower body dressing: Supervision/Verbal cueing     Toileting Toileting Toileting Activity did not occur (Clothing management and hygiene only): N/A (no void or bm)  Toileting assist Assist for toileting: Contact Guard/Touching assist     Transfers Chair/bed transfer  Transfers assist     Chair/bed transfer assist level: Contact Guard/Touching assist     Locomotion Ambulation   Ambulation assist      Assist level: Moderate Assistance - Patient 50 - 74% Assistive device: No Device Max distance: 150'   Walk  10 feet activity   Assist     Assist level: Contact Guard/Touching assist Assistive device: No Device   Walk 50 feet activity   Assist    Assist level: Moderate Assistance - Patient - 50 - 74% Assistive device: No Device    Walk 150 feet activity   Assist    Assist level: Moderate Assistance - Patient - 50 - 74% Assistive device: No Device    Walk 10 feet on uneven surface  activity   Assist     Assist level: Minimal Assistance - Patient > 75%     Wheelchair     Assist Is the patient using a wheelchair?: No             Wheelchair 50 feet  with 2 turns activity    Assist            Wheelchair 150 feet activity     Assist          Blood pressure 131/72, pulse (!) 102, temperature 98.2 F (36.8 C), resp. rate 15, height 5' 2.99" (1.6 m), weight 62.5 kg, last menstrual period 07/25/1998, SpO2 100 %.  Medical Problem List and Plan: 1. Functional deficits secondary to C5-6 fracture status post 3 staged anterior posterior fusion C5-6 corpectomy, posterior fusion C3-7, ACDF C3-7 12/29/2021 with postoperative left hand weakness consistent with C8 radiculopathy status post revision posterior cervical instrumentation 12/30/2021 for misplacement of thoracic pedicle screw.  Cervical collar when out of bed.             -patient may shower             -ELOS/Goals: 7 days             -Continue CIR with PT/OT 2.  Antithrombotics: -DVT/anticoagulation:  Mechanical: Antiembolism stockings, thigh (TED hose) Bilateral lower extremities.  Check vascular study             -antiplatelet therapy: N/A 6/17 b/l LE ultrasound with no evidence of DVT 3. Pain Management: Lidoderm patch, Lyrica 150 mg twice daily, Flexeril as needed, MS IR 15 mg every 4 hours as needed 4. Mood/Sleep/bipolar disorder: Xanax 0.5 mg nightly,Escopiclone 3 mg nightly, Lamictal 200 mg daily,             -antipsychotic agents: Abilify 5 mg nightly 5. Neuropsych/cognition: This patient is capable of making decisions on her own behalf. 6. Skin/Wound Care: Routine skin checks 7. Fluids/Electrolytes/Nutrition: Routine in and outs with follow-up chemistries 8.  Constipation.  MiraLAX twice daily, Movantik 12.5 mg daily, Linzess 290 mcg daily, Colace 100 mg twice daily  6/18 BM today, improved 9.  Hypertension.  Norvasc 7.5 mg daily.  Monitor with increased mobility  -6/18 BP continues to be well controlled 10.  Chronic anemia.  Continue iron supplement.  Follow-up CBC, last HGB 9.8 on 6/8  Repeat CBC tomorrow, follow 11.  Interstitial cystitis: continue Elmiron 200 mg  twice daily 12.  GERD.  Continue Protonix 13.  Cholecystitis gallstones.  Continue Actigall 300 mg twice daily 14. Insomnia  -Cont home lunesta 15. Leukocytosis on prior labs 12/30/21  -Recheck CBC tomorrow  LOS: 2 days A FACE TO FACE EVALUATION WAS PERFORMED  Jennye Boroughs 01/09/2022, 10:33 PM

## 2022-01-10 DIAGNOSIS — K5904 Chronic idiopathic constipation: Secondary | ICD-10-CM

## 2022-01-10 LAB — CBC WITH DIFFERENTIAL/PLATELET
Abs Immature Granulocytes: 0.17 10*3/uL — ABNORMAL HIGH (ref 0.00–0.07)
Basophils Absolute: 0.1 10*3/uL (ref 0.0–0.1)
Basophils Relative: 1 %
Eosinophils Absolute: 0.7 10*3/uL — ABNORMAL HIGH (ref 0.0–0.5)
Eosinophils Relative: 9 %
HCT: 26.3 % — ABNORMAL LOW (ref 36.0–46.0)
Hemoglobin: 8.8 g/dL — ABNORMAL LOW (ref 12.0–15.0)
Immature Granulocytes: 2 %
Lymphocytes Relative: 30 %
Lymphs Abs: 2.4 10*3/uL (ref 0.7–4.0)
MCH: 29.1 pg (ref 26.0–34.0)
MCHC: 33.5 g/dL (ref 30.0–36.0)
MCV: 87.1 fL (ref 80.0–100.0)
Monocytes Absolute: 0.6 10*3/uL (ref 0.1–1.0)
Monocytes Relative: 8 %
Neutro Abs: 3.9 10*3/uL (ref 1.7–7.7)
Neutrophils Relative %: 50 %
Platelets: 361 10*3/uL (ref 150–400)
RBC: 3.02 MIL/uL — ABNORMAL LOW (ref 3.87–5.11)
RDW: 14.2 % (ref 11.5–15.5)
WBC: 7.8 10*3/uL (ref 4.0–10.5)
nRBC: 0 % (ref 0.0–0.2)

## 2022-01-10 LAB — COMPREHENSIVE METABOLIC PANEL
ALT: 9 U/L (ref 0–44)
AST: 14 U/L — ABNORMAL LOW (ref 15–41)
Albumin: 3.2 g/dL — ABNORMAL LOW (ref 3.5–5.0)
Alkaline Phosphatase: 72 U/L (ref 38–126)
Anion gap: 7 (ref 5–15)
BUN: 7 mg/dL — ABNORMAL LOW (ref 8–23)
CO2: 27 mmol/L (ref 22–32)
Calcium: 8.8 mg/dL — ABNORMAL LOW (ref 8.9–10.3)
Chloride: 100 mmol/L (ref 98–111)
Creatinine, Ser: 0.49 mg/dL (ref 0.44–1.00)
GFR, Estimated: >60 mL/min (ref >60)
Glucose, Bld: 102 mg/dL — ABNORMAL HIGH (ref 70–99)
Potassium: 4.2 mmol/L (ref 3.5–5.1)
Sodium: 134 mmol/L — ABNORMAL LOW (ref 135–145)
Total Bilirubin: 0.5 mg/dL (ref 0.3–1.2)
Total Protein: 5.6 g/dL — ABNORMAL LOW (ref 6.5–8.1)

## 2022-01-10 MED ORDER — LINACLOTIDE 145 MCG PO CAPS
290.0000 ug | ORAL_CAPSULE | Freq: Every day | ORAL | Status: DC
  Administered 2022-01-11 – 2022-01-15 (×5): 290 ug via ORAL
  Filled 2022-01-10 (×5): qty 2

## 2022-01-10 MED ORDER — POLYETHYLENE GLYCOL 3350 17 G PO PACK
17.0000 g | PACK | Freq: Two times a day (BID) | ORAL | Status: DC
  Administered 2022-01-10 – 2022-01-15 (×8): 17 g via ORAL
  Filled 2022-01-10 (×10): qty 1

## 2022-01-10 MED ORDER — NALOXEGOL OXALATE 12.5 MG PO TABS
12.5000 mg | ORAL_TABLET | Freq: Every day | ORAL | Status: DC
Start: 2022-01-11 — End: 2022-01-15
  Administered 2022-01-11 – 2022-01-15 (×5): 12.5 mg via ORAL
  Filled 2022-01-10 (×5): qty 1

## 2022-01-10 MED ORDER — ENOXAPARIN SODIUM 40 MG/0.4ML IJ SOSY
40.0000 mg | PREFILLED_SYRINGE | INTRAMUSCULAR | Status: DC
  Administered 2022-01-10 – 2022-01-14 (×5): 40 mg via SUBCUTANEOUS
  Filled 2022-01-10 (×6): qty 0.4

## 2022-01-10 NOTE — Progress Notes (Signed)
[facility] Individual Statement of Services  Patient Name:  Rachel Duran  Date:  01/10/2022  Welcome to the [facility].  Our goal is to provide you with an individualized program based on your diagnosis and situation, designed to meet your specific needs.  With this comprehensive rehabilitation program, you will be expected to participate in at least 3 hours of rehabilitation therapies Monday-Friday, with modified therapy programming on the weekends.  Your rehabilitation program will include the following services:  Physical Therapy (PT), Occupational Therapy (OT), 24 hour per day rehabilitation nursing, Care Coordinator, Rehabilitation Medicine, Nutrition Services, and Pharmacy Services  Weekly team conferences will be held on Tuesday to discuss your progress.  Your Inpatient Rehabilitation Care Coordinator will talk with you frequently to get your input and to update you on team discussions.  Team conferences with you and your family in attendance may also be held.  Expected length of stay: 7-8 days  Overall anticipated outcome: supervision-mod/I level  Depending on your progress and recovery, your program may change. Your Inpatient Rehabilitation Care Coordinator will coordinate services and will keep you informed of any changes. Your Inpatient Rehabilitation Care Coordinator's name and contact numbers are listed  below.  The following services may also be recommended but are not provided by the [facility] will be made to provide these services after discharge if needed.  Arrangements include referral to agencies that provide these services.  Your insurance has been verified to be:  [employer]-Medicare Your primary doctor is:  Ria Bush  Pertinent information will be shared with your doctor and your insurance  company.  Inpatient Rehabilitation Care Coordinator:  Ovidio Kin, [place] or Emilia Beck  Information discussed with and copy given to patient by: Elease Hashimoto, 01/10/2022, 10:51 AM

## 2022-01-10 NOTE — Progress Notes (Signed)
Inpatient Rehabilitation  Patient information reviewed and entered into eRehab system by Dairon Procter M. Haik Mahoney, M.A., CCC/SLP, PPS Coordinator.  Information including medical coding, functional ability and quality indicators will be reviewed and updated through discharge.    

## 2022-01-10 NOTE — Progress Notes (Signed)
Occupational Therapy Session Note  Patient Details  Name: Rachel Duran MRN: [id number] Date of Birth: 12-Jan-1956  Today's Date: 01/10/2022 OT Individual Time: 9326-7124 OT Individual Time Calculation (min): 60 min    Short Term Goals: Week 1:  OT Short Term Goal 1 (Week 1): STG = LTG 2/2 ELOS  Skilled Therapeutic Interventions/Progress Updates:   Pt seen for skilled OT session this am. Pt up in recliner working on foam cube pinch HEP upon OT arrival. Pt reports 5/10 L shoulder pain due to report of "heavy and somewhat burning" pain related to sx. Pt had rx and Lidoderm patch this am with some relief. OT began session with positioning and education especially when using Ue's in wide recliner to utilize towel or blanket roll to counteract effects of gravity on Ue's with + report of relief when implemented. OT applied SaeboStim One to L UE at finger flexors and extensors 15 min each as follows: 330 pulse width 35 Hz pulse rate On 8 sec/ off 8 sec Ramp up/ down 2 sec Symmetrical Biphasic wave form  Max intensity 163m at 500 Ohm load Pt with full AAROM with stim applied and reports "I love this- it feels like I am working my hand normally without bringing my shoulder up". OT educated in avoiding excess compensatory strategies and trying to be mindful of hand use only to avoid added effort. No skin issues after stim removed. Pt then able to amb with RW and gait belt in place ~ 150 ft with CGA to middle hallway therapy gym for accessing moist heat and mat table for supine positioning with more upright position and training for log rolling with mod cues/demo and CGA to ensure cervical prec. Pt donned and doffed pillow case with B hands and no assistance. OT applied 10 min of moist heat to L sh region with + results and reports of feeling less heavy. Once back to room, pt remained up in recliner with L UE on towel roll supported, chair alarm active and needs and nurse call bell within reach.     Therapy Documentation Precautions:  Precautions Precautions: Cervical, Fall Precaution Comments: cervical Required Braces or Orthoses: Cervical Brace Cervical Brace: Hard collar Restrictions Weight Bearing Restrictions: No    Therapy/Group: Individual Therapy  EBarnabas Lister6/19/2023, 3:33 PM

## 2022-01-10 NOTE — IPOC Note (Signed)
Overall Plan of Care [facility]) Patient Details Name: Rachel Duran MRN: [id number] DOB: 02-25-1956  Admitting Diagnosis: Radiculopathy  Hospital Problems: Principal Problem:   Radiculopathy Active Problems:   Insomnia   Constipation   Fusion of spine, cervical region   Anemia   Primary hypertension     Functional Problem List: Nursing Edema, Endurance, Medication Management, Motor, Safety, Skin Integrity, Pain  PT Balance, Behavior, Endurance, Motor, Pain, Safety  OT Balance, Endurance, Motor, Pain, Sensory  SLP    TR         Basic ADL's: OT Eating, Grooming, Bathing, Dressing, Toileting     Advanced  ADL's: OT Simple Meal Preparation     Transfers: PT Bed Mobility, Bed to Chair, Car, Manufacturing systems engineer, Metallurgist: PT Ambulation, Stairs     Additional Impairments: OT Fuctional Use of Upper Extremity  SLP        TR      Anticipated Outcomes Item Anticipated Outcome  Self Feeding mod I  Swallowing      Basic self-care  mod I to S  Toileting  mod I   Bathroom Transfers mod I, S  Bowel/Bladder  n/a  Transfers  Supervision  Locomotion  Supervision  Communication     Cognition     Pain  < 3  Safety/Judgment  supervision   Therapy Plan: PT Intensity: Minimum of 1-2 x/day ,45 to 90 minutes PT Frequency: 5 out of 7 days PT Duration Estimated Length of Stay: 7-10 Days OT Intensity: Minimum of 1-2 x/day, 45 to 90 minutes OT Frequency: 5 out of 7 days OT Duration/Estimated Length of Stay: 5-7 days     Team Interventions: Nursing Interventions Patient/Family Education, Disease Management/Prevention, Pain Management, Medication Management, Skin Care/Wound Management, Discharge Planning  PT interventions Ambulation/gait training, Community reintegration, Neuromuscular re-education, DME/adaptive equipment instruction, Psychosocial support, UE/LE Strength taining/ROM, Stair training, Training and development officer, Discharge  planning, Functional electrical stimulation, Pain management, Skin care/wound management, Therapeutic Activities, UE/LE Coordination activities, Cognitive remediation/compensation, Disease management/prevention, Functional mobility training, Therapeutic Exercise, Visual/perceptual remediation/compensation, Splinting/orthotics, Patient/family education  OT Interventions Balance/vestibular training, Neuromuscular re-education, Self Care/advanced ADL retraining, Therapeutic Exercise, UE/LE Strength taining/ROM, Pain management, DME/adaptive equipment instruction, Community reintegration, Barrister's clerk education, UE/LE Coordination activities, Therapeutic Activities, Psychosocial support, Functional mobility training, Discharge planning  SLP Interventions    TR Interventions    SW/CM Interventions Discharge Planning, Psychosocial Support, Patient/Family Education   Barriers to Discharge MD  Medical stability, Home enviroment access/loayout, Wound care, Lack of/limited family support, and Weight bearing restrictions  Nursing Decreased caregiver support, Home environment access/layout, Wound Care, Lack of/limited family support, Weight bearing restrictions 2 level, level entry. Bed/bath main level. Spouse retired EMT, occasionally works part-time. Can provide 24/7 care.  PT      OT Other (comments) cervical precautions  SLP      SW Insurance for SNF coverage     Team Discharge Planning: Destination: PT-Home ,OT- Home , SLP-  Projected Follow-up: PT-24 hour supervision/assistance, Outpatient PT, OT-  Outpatient OT, SLP-  Projected Equipment Needs: PT-To be determined, OT- To be determined, SLP-  Equipment Details: PT- , OT-  Patient/family involved in discharge planning: PT- Patient,  OT-Patient, SLP-   MD ELOS: ~ 7 days Medical Rehab Prognosis:  Good Assessment:  The patient has been admitted for CIR therapies with the diagnosis of cervical radiculopathy. The team will be addressing  functional mobility, strength, stamina, balance, safety, adaptive techniques and equipment, self-care, bowel and bladder mgt, patient and  caregiver education, trigger point injections. Goals have been set at supervision to mod I. Anticipated discharge destination is home.        See Team Conference Notes for weekly updates to the plan of care

## 2022-01-10 NOTE — Progress Notes (Signed)
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 C6- C7 -hx. past fusion(bone graft used)   Cholecystitis    Chronic pain    DDD (degenerative disc disease), lumbar 09/2015   dextroscoliosis with multilevel DDD and facet arthrosis most notable for R foraminal disc protrusion L4/5 producing severe R neural foraminal stenosis abutting R L4 nerve root, moderate spinal canal and mild lat recesss and R neural foraminal stenosis L3/4 (MRI)   Depression    bipolar depression   Disorders of porphyrin metabolism    Felon of finger of left hand 11/10/2016   Fibromyalgia    GERD (gastroesophageal reflux disease)    Headache    occasionally   Internal hemorrhoids    Interstitial cystitis 06/06/2012   hx.   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 (Dr Baird Cancer retinologist)   Tuberculosis    Past Surgical History:  Past Surgical History:  Procedure Laterality Date   ANTERIOR CERVICAL  DECOMP/DISCECTOMY FUSION  2004   C5/6, C6/7   ANTERIOR CERVICAL DECOMP/DISCECTOMY FUSION  02/2016   C3/4, C4/5 with plating [name])   ANTERIOR CERVICAL DECOMP/DISCECTOMY FUSION  12/29/2021   Procedure: ANTERIOR CERVICAL DECOMPRESSION/ DISCECTOMY FUSION CERVICAL THREE - SEVEN;  Surgeon: Newman Pies, MD;  Location: [facility];  Service: Neurosurgery;;   ANTERIOR CERVICAL DECOMPRESSION/DISCECTOMY FUSION 4 LEVELS N/A 12/29/2021   Procedure: CERVICAL FIVE-SIX CORPECTOMY,;  Surgeon: Newman Pies, MD;  Location: [facility];  Service: Neurosurgery;  Laterality: N/A;   AUGMENTATION MAMMAPLASTY Bilateral 03/25/2010   BREAST ENHANCEMENT SURGERY  2010   BREAST IMPLANT EXCHANGE  10/2014   exchange saline implants, B mastopexy/capsulorraphy ([facility])   BUNIONECTOMY Bilateral yrs ago   [place]   x 1   CHOLECYSTECTOMY  06/11/2012   Procedure: LAPAROSCOPIC CHOLECYSTECTOMY WITH INTRAOPERATIVE CHOLANGIOGRAM;  Surgeon: [name], MD,FACS;  Location: [facility];  Service: General;  Laterality: N/A;   COLONOSCOPY  02/2018   done for positive cologuard - WNL, rpt 10 yrs Fuller Plan)   CYSTOSCOPY     ERCP  05/22/2012   Procedure: ENDOSCOPIC RETROGRADE CHOLANGIOPANCREATOGRAPHY (ERCP);  Surgeon: [name], MD,FACG;  Location: [facility];  Service: Endoscopy;  Laterality: N/A;   ERCP N/A 09/17/2013   Procedure: ENDOSCOPIC RETROGRADE CHOLANGIOPANCREATOGRAPHY (ERCP);  Surgeon: [name], MD;  Location: [facility];  Service: Endoscopy;  Laterality: N/A;  ERCP N/A 09/27/2018   Procedure: ENDOSCOPIC RETROGRADE CHOLANGIOPANCREATOGRAPHY (ERCP);  Surgeon: [name], MD;  Location: [facility];  Service: Endoscopy;  Laterality: N/A;   ESOPHAGOGASTRODUODENOSCOPY  09/2016   WNL. esophagus dilated [facility] Plan)   HEMORRHOID BANDING  09/23/2013   --Dr. Greer Pickerel   HERNIA REPAIR     inguinal   HYSTEROSCOPY W/ ENDOMETRIAL ABLATION     KNEE ARTHROSCOPY Right 04/06/2021   Procedure: RIGHT  KNEE ARTHROSCOPY WITH DEBRIDEMENT;  Surgeon: [name], MD;  Location: [facility];  Service: Orthopedics;  Laterality: Right;   NASAL SINUS SURGERY     x5   PARTIAL KNEE ARTHROPLASTY Right 06/01/2020   Procedure: Right knee patellofemoral replacement;  Surgeon: [name], MD;  Location: [facility];  Service: Orthopedics;  Laterality: Right;   POSTERIOR CERVICAL FUSION/FORAMINOTOMY N/A 12/29/2021   Procedure: POSTERIOR CERVICAL FUSION CERVICAL THREE-SEVEN;  Surgeon: Newman Pies, MD;  Location: [place];  Service: Neurosurgery;  Laterality: N/A;   REMOVAL OF STONES  09/27/2018   Procedure: REMOVAL OF STONES;  Surgeon: [name], MD;  Location: [facility];  Service: Endoscopy;;   SPHINCTEROTOMY  09/27/2018   Procedure: SPHINCTEROTOMY;  Surgeon: [name], MD;  [facility];  Service: Endoscopy;;   TONSILLECTOMY     removed as a child   TOTAL HIP ARTHROPLASTY Left 03/12/2019   Procedure: LEFT TOTAL HIP ARTHROPLASTY ANTERIOR APPROACH;  [name], [name], MD)   UPPER GASTROINTESTINAL ENDOSCOPY     Social History:  reports that she has never smoked. She has never been exposed to tobacco smoke. She has never used smokeless tobacco. She reports that she does not drink alcohol and does not use drugs.  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 Careers adviser: 24/7 Family Dynamics: Close knit with husand and family who are suppportive and provide emotional support  Social History Preferred language: English Religion: Christian Cultural Background: No issues Education: Secretary/administrator educated Probation officer - How often do you need to have someone help you when  you read instructions, pamphlets, or other written material from your doctor or pharmacy?: Never Writes: Yes Employment Status: Disabled Public relations account executive Issues: No issues Guardian/Conservator: None-according to MD pt is capable of making her own decisions while here   Abuse/Neglect Abuse/Neglect Assessment Can Be Completed: Yes Physical Abuse: Denies Verbal Abuse: Denies Sexual Abuse: Denies Exploitation of patient/patient's resources: Denies Self-Neglect: Denies  Patient response to: Social Isolation - How often do you feel lonely or isolated from those around you?: Never  Emotional Status Pt's affect, behavior and adjustment status: Pt is motivated to do well and can see the improvement already. She is optimistic she will make good progress here. She reports she is already standing straighter than she has in years. Recent Psychosocial Issues: other health issues Psychiatric History: History-bipolar/depression takes medications which she finds helpful. She feels she is adjusting well to this surgery. She has been through multiple times before. Substance Abuse History: No issues  Patient / Family Perceptions, Expectations & Goals Pt/Family understanding of illness & functional limitations: Pt and husband have a good understanding of her surgery and issues as a result. They have been through this before and husband is a EMT by trade. Pt feels her concerns and questions are being addressed. Premorbid pt/family roles/activities: Wife, church member,  friend, neighbor, etc Anticipated changes in roles/activities/participation: resume Pt/family expectations/goals: Pt states: " I am hopeful I will do well here and have made progress already."  [employer]: None Premorbid Home Care/DME Agencies: Other (Comment) (rw, cane and tub seat) Transportation available at discharge: Husband Is the patient able to respond to transportation needs?: Yes In the past  12 months, has lack of transportation kept you from medical appointments or from getting medications?: No In the past 12 months, has lack of transportation kept you from meetings, work, or from getting things needed for daily living?: No  Discharge Planning Living Arrangements: Spouse/significant other Support Systems: Spouse/significant other, Friends/neighbors, Church/faith community Type of Residence: Private residence Insurance Resources: Multimedia programmer (specify) Primary school teacher) Financial Resources: SSD, Family Support Financial Screen Referred: No Living Expenses: Own Money Management: Patient, Spouse Does the patient have any problems obtaining your medications?: No Home Management: Both Patient/Family Preliminary Plans: Return home with husband who is retired but still consults part time from home. Made aware of team conference tomorrow and will update then. Husband can provide 24/7 care if needed. Care Coordinator Barriers to Discharge: Insurance for SNF coverage Care Coordinator Anticipated Follow Up Needs: HH/OP  Clinical Impression Pleasant motivated female who is willing to work and do her part to regain her independence. Her husband is involved and will assist if needed. Will update once team conference tomorrow.  Elease Hashimoto 01/10/2022, 10:49 AM

## 2022-01-10 NOTE — Progress Notes (Signed)
Physical Therapy Session Note  Patient Details  Name: Rachel Duran MRN: [id number] Date of Birth: [birth year]  Today's Date: 01/10/2022 PT Individual Time: 5615-3794 PT Individual Time Calculation (min): 70 min   Short Term Goals: Week 1:  PT Short Term Goal 1 (Week 1): STGs = LTGs  Skilled Therapeutic Interventions/Progress Updates:    Pt received in recliner and agreeable to therapy.  Pt reports pain well controlled at this time. Pt ambulates throughout session with RW and CGA, no LOB or knee buckle notes, but increased ataxia/trendelenburg from previous session. Pt reports feeling more "clumsy." Longest bout 200 ft. Pt directed in obstacle course as follows: -weaving through 5 cones -stepping over 2 hockey sticks -added step over yoga blocks x 2 -added step onto airex pad in // bars -added rockerboard in // bars Pt completed laps down and back with seated rest breaks, adding elements after each trial.   Pt then completed obstacles in // bars, stepping laterally for improved glut med activation, x 2 laps: -rockerboard with 10 rocks while standing on board -Stepping over cones x2  -airex pad with 10 knee bends with no UE support.   Nustep 3 x 3 min with 1-2 min at level 6, maintaining ~72spm. Pt reported RPE= 17/20 during last bout, pt with max heart rate of 145 bpm after last set. Pt demoes elevated heart rate for improved endurance and strength.  Pt ambulated back to room with CGA, RW and w/c follow for safety d/t fatigue. Pt with R knee buckle x 1, mod A to recover. Pt returned to recliner and was left with all needs in reach and alarm active.   Therapy Documentation Precautions:  Precautions Precautions: Cervical, Fall Precaution Comments: cervical Required Braces or Orthoses: Cervical Brace Cervical Brace: Hard collar Restrictions Weight Bearing Restrictions: No General:      Therapy/Group: Individual Therapy  Mickel Fuchs 01/10/2022, 3:00 PM

## 2022-01-10 NOTE — Progress Notes (Signed)
PROGRESS NOTE   Subjective/Complaints:  Pt reports bowels are a big issue- has IBS/constipation and scleroderma- needs Miralax and Linzess and Movantik at 6am so can go before therapy in AM- will change Also notes walked a lot yesterday, but then knee buckled a little on stairs yesterday.  Ate 505 of tray.   Having severe rhomboids, cervical paraspinal pain.  Asked PT/OT to start after 8:30am so bowel meds will work.   Also educated on need for Lovenox- not walking >179f regularly yet.    OS:  Pt denies SOB, abd pain, CP, N/V/C/D, and vision changes    Objective:   No results found. Recent Labs    01/10/22 0615  WBC 7.8  HGB 8.8*  HCT 26.3*  PLT 361   Recent Labs    01/10/22 0615  NA 134*  K 4.2  CL 100  CO2 27  GLUCOSE 102*  BUN 7*  CREATININE 0.49  CALCIUM 8.8*    Intake/Output Summary (Last 24 hours) at 01/10/2022 1433 Last data filed at 01/10/2022 1255 Gross per 24 hour  Intake 660 ml  Output --  Net 660 ml         Physical Exam: Vital Signs Blood pressure 128/81, pulse (!) 103, temperature 98.4 F (36.9 C), temperature source Oral, resp. rate 16, height 5' 2.99" (1.6 m), weight 62.5 kg, last menstrual period 07/25/1998, SpO2 99 %. Physical Exam:    General: awake, alert, appropriate,  sitting up in bed; Dr JArnoldo Moralecame to see pt while I was there; NAD HENT: conjugate gaze; oropharynx moist- cervical collar in place; has palpable trigger points in rhomboids, cervical paraspinals-  CV: regular rate; no JVD Pulmonary: CTA B/L; no W/R/R- good air movement GI: soft, NT, ND, (+)BS Psychiatric: appropriate- interactive Neurological: Ox3  Skin: anterior neck incision with steri-strips Neurological:     Comments: Patient is alert.  Oriented x3 and follows commands. 5/5 strength throughout with the exception of left hand grip 4/5.     Assessment/Plan: 1. Functional deficits which require  3+ hours per day of interdisciplinary therapy in a comprehensive inpatient rehab setting. Physiatrist is providing close team supervision and 24 hour management of active medical problems listed below. Physiatrist and rehab team continue to assess barriers to discharge/monitor patient progress toward functional and medical goals  Care Tool:  Bathing  Bathing activity did not occur: Refused (pt already dressed) Body parts bathed by patient: Right arm, Left arm, Chest, Abdomen, Right upper leg, Left upper leg, Face   Body parts bathed by helper: Right lower leg, Left lower leg     Bathing assist Assist Level: Minimal Assistance - Patient > 75%     Upper Body Dressing/Undressing Upper body dressing   What is the patient wearing?: Bra, Pull over shirt Orthosis activity level: Performed by helper  Upper body assist Assist Level: Minimal Assistance - Patient > 75%    Lower Body Dressing/Undressing Lower body dressing      What is the patient wearing?: Underwear/pull up     Lower body assist Assist for lower body dressing: Supervision/Verbal cueing     Toileting Toileting Toileting Activity did not occur (Clothing management and hygiene only):  N/A (no void or bm)  Toileting assist Assist for toileting: Independent     Transfers Chair/bed transfer  Transfers assist     Chair/bed transfer assist level: Contact Guard/Touching assist     Locomotion Ambulation   Ambulation assist      Assist level: Moderate Assistance - Patient 50 - 74% Assistive device: No Device Max distance: 150'   Walk 10 feet activity   Assist     Assist level: Contact Guard/Touching assist Assistive device: No Device   Walk 50 feet activity   Assist    Assist level: Moderate Assistance - Patient - 50 - 74% Assistive device: No Device    Walk 150 feet activity   Assist    Assist level: Moderate Assistance - Patient - 50 - 74% Assistive device: No Device    Walk 10 feet on  uneven surface  activity   Assist     Assist level: Minimal Assistance - Patient > 75%     Wheelchair     Assist Is the patient using a wheelchair?: No             Wheelchair 50 feet with 2 turns activity    Assist            Wheelchair 150 feet activity     Assist          Blood pressure 128/81, pulse (!) 103, temperature 98.4 F (36.9 C), temperature source Oral, resp. rate 16, height 5' 2.99" (1.6 m), weight 62.5 kg, last menstrual period 07/25/1998, SpO2 99 %.  Medical Problem List and Plan: 1. Functional deficits secondary to C5-6 fracture status post 3 staged anterior posterior fusion C5-6 corpectomy, posterior fusion C3-7, ACDF C3-7 12/29/2021 with postoperative left hand weakness consistent with C8 radiculopathy status post revision posterior cervical instrumentation 12/30/2021 for misplacement of thoracic pedicle screw.  Cervical collar when out of bed.             -patient may shower             -ELOS/Goals: 7 days             Continue CIR- PT, OT - IPOC today 2.  Antithrombotics: -DVT/anticoagulation:  Mechanical: Antiembolism stockings, thigh (TED hose) Bilateral lower extremities.  Check vascular study             -antiplatelet therapy: N/A 6/17 b/l LE ultrasound with no evidence of DVT  6/19- will start Lovenox 40 mg daily- has been Greater than 7 days.  3. Pain Management: Lidoderm patch, Lyrica 150 mg twice daily, Flexeril as needed, MS IR 15 mg every 4 hours as needed  6/19- still having myofascial pain- will do trigger point injections 4. Mood/Sleep/bipolar disorder: Xanax 0.5 mg nightly,Escopiclone 3 mg nightly, Lamictal 200 mg daily,             -antipsychotic agents: Abilify 5 mg nightly 5. Neuropsych/cognition: This patient is capable of making decisions on her own behalf. 6. Skin/Wound Care: Routine skin checks 7. Fluids/Electrolytes/Nutrition: Routine in and outs with follow-up chemistries 8.  Constipation.  MiraLAX twice daily,  Movantik 12.5 mg daily, Linzess 290 mcg daily, Colace 100 mg twice daily  6/18 BM today, improved  6/19- change Movantik and Linzess to 6am and Miralax Am dose to 6am- per pt home schedule. Will have therapy not start til 8:30am 9.  Hypertension.  Norvasc 7.5 mg daily.  Monitor with increased mobility  6/19- BP controlled- con't regimen 10.  Chronic anemia.  Continue iron  supplement.  Follow-up CBC, last HGB 9.8 on 6/8  Repeat CBC tomorrow, follow 11.  Interstitial cystitis: continue Elmiron 200 mg twice daily 12.  GERD.  Continue Protonix 13.  Cholecystitis gallstones.  Continue Actigall 300 mg twice daily 14. Insomnia  -Cont home lunesta 15. Leukocytosis on prior labs 12/30/21  -6/19- WBC down to 7.8k- con't to monitor 16. Myofascial pain- will do Trigger point injections tomorrow if hasn't improved.    I spent a total of 40   minutes on total care today- >50% coordination of care- due to IPOC, d/w Dr [name] and d/w PT about schedule.    LOS: 3 days A FACE TO FACE EVALUATION WAS PERFORMED  Rachel Duran 01/10/2022, 2:33 PM

## 2022-01-10 NOTE — Progress Notes (Signed)
Physical Therapy Session Note  Patient Details  Name: Rachel Duran MRN: [id number] Date of Birth: 05/31/1956  Today's Date: 01/10/2022 PT Individual Time: 6010-9323 PT Individual Time Calculation (min): 57 min   Short Term Goals: Week 1:  PT Short Term Goal 1 (Week 1): STGs = LTGs  Skilled Therapeutic Interventions/Progress Updates:    Pt received in recliner and agreeable to therapy.  Pt reports 2/10 pain in her R arm and between shoulder blades. Pt reports using tens with OT and discussing triggerpoint injection with MD. Reports tens was very helpful and denies further intervention at this time. Pt ambulates with RW throughout session, largely CGA with occ min A. Pt demoes mildly ataxic gait with variable BOS, trendelenburg and decr DF R>L. Gait up to 200 ft, but noted increase in gait impairments with fatigue. Pt navigated 6" steps with mod A to guard knees and cue for foot placement. BIL handrails. Pt self selected alternating pattern to ascend cued for step to pattern while descending for safety. Noted poor coordination and difficulty with eccentric control. Pt then directed in step ups on first step for improved eccentric control. Noted lateral hip instability while stepping down. Min A for cueing and safety. Transitioned to lateral step up on 4" step with poles for balance. Mod A for stepping up/down and cueing, mirror for visual feedback. Noted tendency to place foot posterior when stepping both up and down, improved with cueing and reliance on visual feedback. Pt returned to room and to recliner, was left with all needs in reach and alarm active.   Therapy Documentation Precautions:  Precautions Precautions: Cervical, Fall Precaution Comments: cervical Required Braces or Orthoses: Cervical Brace Cervical Brace: Hard collar Restrictions Weight Bearing Restrictions: No General:       Therapy/Group: Individual Therapy  Mickel Fuchs 01/10/2022, 12:08 PM

## 2022-01-10 NOTE — Plan of Care (Signed)
  Problem: RH SAFETY Goal: RH STG ADHERE TO SAFETY PRECAUTIONS W/ASSISTANCE/DEVICE Description: STG Adhere to Safety Precautions With Cues and Reminders. Outcome: Not Progressing   Problem: RH SKIN INTEGRITY Goal: RH STG MAINTAIN SKIN INTEGRITY WITH ASSISTANCE Description: STG Maintain Skin Integrity With Supervision Assistance. Outcome: Not Progressing

## 2022-01-10 NOTE — Progress Notes (Signed)
Subjective: The patient is alert and pleasant.  She looks well.  Objective: Vital signs in last 24 hours: Temp:  [98 F (36.7 C)-99.1 F (37.3 C)] 98 F (36.7 C) (06/19 0437) Pulse Rate:  [90-110] 90 (06/19 0437) Resp:  [15-16] 15 (06/19 0437) BP: (121-131)/(72-86) 122/74 (06/19 0437) SpO2:  [97 %-100 %] 97 % (06/19 0437) Estimated body mass index is 24.41 kg/m as calculated from the following:   Height as of this encounter: 5' 2.99" (1.6 m).   Weight as of this encounter: 62.5 kg.   Intake/Output from previous day: 06/18 0701 - 06/19 0700 In: 1260 [P.O.:1260] Out: -  Intake/Output this shift: No intake/output data recorded.  Physical exam the patient is alert and pleasant.  She is moving all 4 extremities well.  Lab Results: Recent Labs    01/10/22 0615  WBC 7.8  HGB 8.8*  HCT 26.3*  PLT 361   BMET Recent Labs    01/10/22 0615  NA 134*  K 4.2  CL 100  CO2 27  GLUCOSE 102*  BUN 7*  CREATININE 0.49  CALCIUM 8.8*    Studies/Results: No results found.  Assessment/Plan: The patient is doing well in rehab.  LOS: 3 days     [name] 01/10/2022, 8:01 AM     Patient ID: Rachel Duran, female   DOB: Nov 27, 1955, 66 y.o.   MRN: [id number]

## 2022-01-11 MED ORDER — LIDOCAINE 5 % EX PTCH
3.0000 | MEDICATED_PATCH | Freq: Every day | CUTANEOUS | Status: DC
Start: 2022-01-11 — End: 2022-01-15
  Administered 2022-01-11 – 2022-01-15 (×5): 3 via TRANSDERMAL
  Filled 2022-01-11 (×6): qty 3

## 2022-01-11 NOTE — Patient Care Conference (Signed)
Inpatient RehabilitationTeam Conference and Plan of Care Update Date: 01/11/2022   Time: 11:48 AM    Patient Name: Rachel Duran      Medical Record Number: [id number]  Date of Birth: October 01, 1955 Sex: Female         Room/Bed: 4W13C/4W13C-01 Payor Info: Payor: [employer] MEDICARE / Plan: [facility] MEDICARE / Product Type: *No Product type* /    Admit Date/Time:  01/07/2022  2:02 PM  Primary Diagnosis:  Radiculopathy  Hospital Problems: Principal Problem:   Radiculopathy Active Problems:   Insomnia   Constipation   Fusion of spine, cervical region   Anemia   Primary hypertension    Expected Discharge Date: Expected Discharge Date: 01/18/22  Team Members Present: Physician leading conference: Dr. Courtney Heys Social Worker Present: Ovidio Kin, LCSW Nurse Present: Dorthula Nettles, RN PT Present: Ailene Rud, PT OT Present: Jamey Ripa, OT SLP Present: Lillie Columbia, SLP PPS Coordinator present : Gunnar Fusi, SLP     Current Status/Progress Goal Weekly Team Focus  Bowel/Bladder   continent last BM 06/18  no constipation  medication re-evaluation, home meds to be restarted   Swallow/Nutrition/ Hydration             ADL's   Increased time and effort and use of AE for UB bathing and dressing with min- mod A, LE self care wiht min A with AE, bathroom transfers with CGA  Supervision  UE NMRE, balance, positioning, FMC and HEP   Mobility   Largely CGA but occ min A for ataxic gait/balance. Stairs with min A  Supervision  coordination, gait mechanics   Communication             Safety/Cognition/ Behavioral Observations            Pain   pain controlled with current regimen  pain free  pain management before activity   Skin   intact, steri strips on operative sites  prevention of pressure sores, and equipment related sore  inspect neck and surrounding area for aspen collar skin breakdown     Discharge Planning:  Home with husband who is retired but does  work som from home. Can provide 24/7 care if needed.   Team Discussion: Adjusting bowel medications. Started Lovenox. Prefers non-invasive treatment versus trigger point injections. Continent B/B, reports 6/10 pain. Incisions CDI. Discharging home with husband who is a retired Public relations account executive.   Patient on target to meet rehab goals: yes, supervision goals, could upgrade to Mod I. Currently CGA to close supervision. Min assist to CGA transfers.  *See Care Plan and progress notes for long and short-term goals.   Revisions to Treatment Plan:  Adjusting medications, revising therapy goals.   Teaching Needs: Family education, medication/pain management, skin/wound care, transfer/gait training, etc.   Current Barriers to Discharge: Decreased caregiver support, Home enviroment access/layout, and Wound care  Possible Resolutions to Barriers: Family education Outpatient therapy Order recommended DME     Medical Summary Current Status: cervical collar- continent of B/B- rhomboids/cervical paraspinal myofascial pain- multiple neck incision  Barriers to Discharge: Decreased family/caregiver support;Home enviroment access/layout;Neurogenic Bowel & Bladder;Medical stability;Weight bearing restrictions;Wound care  Barriers to Discharge Comments: going home with husband- 24/7 care- will not likely need to go home on lovenox Possible Resolutions to Barriers/Weekly Focus: CGA mainly- goals -will upgrade to mod I- barriers- might need trigger point injections for pain- 01/18/22 will need RW   Continued Need for Acute Rehabilitation Level of Care: The patient requires daily medical management by a physician  with specialized training in physical medicine and rehabilitation for the following reasons: Direction of a multidisciplinary physical rehabilitation program to maximize functional independence : Yes Medical management of patient stability for increased activity during participation in an intensive  rehabilitation regime.: Yes Analysis of laboratory values and/or radiology reports with any subsequent need for medication adjustment and/or medical intervention. : Yes   I attest that I was present, lead the team conference, and concur with the assessment and plan of the team.   Cristi Loron 01/11/2022, 3:34 PM

## 2022-01-11 NOTE — Progress Notes (Signed)
Patient ID: Rachel Duran, female   DOB: 1955/11/14, 66 y.o.   MRN: [id number]  Met with pt to update her regarding team conference goals of mod/I and target discharge date of 6/27. She feels this will allow hr enough time to improve and be ready to go home. She is really glad the insurance has allowed her to come here and get therapies before going home. Discussed OP recommendation and she is agreeable to this but wants to come back to [facility] to get it. She has a rolling walker from her past surgeries so no equipment needs. Will work on discharge needs and continue to follow.

## 2022-01-11 NOTE — Progress Notes (Signed)
PROGRESS NOTE   Subjective/Complaints:  Pt reports just had BM this AM Pain still in shoulder blades- reticent to have multiple trigger point injections -wants to try noninvasive treatments first.     ROS:  Pt denies SOB, abd pain, CP, N/V/C/D, and vision changes   Objective:   No results found.  Recent Labs    01/10/22 0615  WBC 7.8  HGB 8.8*  HCT 26.3*  PLT 361   Recent Labs    01/10/22 0615  NA 134*  K 4.2  CL 100  CO2 27  GLUCOSE 102*  BUN 7*  CREATININE 0.49  CALCIUM 8.8*    Intake/Output Summary (Last 24 hours) at 01/11/2022 0852 Last data filed at 01/11/2022 0800 Gross per 24 hour  Intake 796 ml  Output --  Net 796 ml        Physical Exam: Vital Signs Blood pressure 100/76, pulse 88, temperature 98.3 F (36.8 C), temperature source Oral, resp. rate 16, height 5' 2.99" (1.6 m), weight 62.5 kg, last menstrual period 07/25/1998, SpO2 97 %. Physical Exam:     General: awake, alert, appropriate, NAD HENT: conjugate gaze; oropharynx moist- cervical collar in place- trigger points still in neck/shoulders B/L and rhomboids CV: regular rate; no JVD Pulmonary: CTA B/L; no W/R/R- good air movement GI: soft, NT, ND, (+)BS Psychiatric: appropriate- interactive Neurological: Ox3 Skin: anterior neck incision with steri-strips Neurological:     Comments: Patient is alert.  Oriented x3 and follows commands. 5/5 strength throughout with the exception of left hand grip 4/5.     Assessment/Plan: 1. Functional deficits which require 3+ hours per day of interdisciplinary therapy in a comprehensive inpatient rehab setting. Physiatrist is providing close team supervision and 24 hour management of active medical problems listed below. Physiatrist and rehab team continue to assess barriers to discharge/monitor patient progress toward functional and medical goals  Care Tool:  Bathing  Bathing activity  did not occur: Refused (pt already dressed) Body parts bathed by patient: Right arm, Left arm, Chest, Abdomen, Right upper leg, Left upper leg, Face   Body parts bathed by helper: Right lower leg, Left lower leg     Bathing assist Assist Level: Minimal Assistance - Patient > 75%     Upper Body Dressing/Undressing Upper body dressing   What is the patient wearing?: Bra, Pull over shirt Orthosis activity level: Performed by helper  Upper body assist Assist Level: Minimal Assistance - Patient > 75%    Lower Body Dressing/Undressing Lower body dressing      What is the patient wearing?: Underwear/pull up     Lower body assist Assist for lower body dressing: Supervision/Verbal cueing     Toileting Toileting Toileting Activity did not occur (Clothing management and hygiene only): N/A (no void or bm)  Toileting assist Assist for toileting: Independent     Transfers Chair/bed transfer  Transfers assist     Chair/bed transfer assist level: Contact Guard/Touching assist     Locomotion Ambulation   Ambulation assist      Assist level: Moderate Assistance - Patient 50 - 74% Assistive device: No Device Max distance: 150'   Walk 10 feet activity   Assist  Assist level: Contact Guard/Touching assist Assistive device: No Device   Walk 50 feet activity   Assist    Assist level: Moderate Assistance - Patient - 50 - 74% Assistive device: No Device    Walk 150 feet activity   Assist    Assist level: Moderate Assistance - Patient - 50 - 74% Assistive device: No Device    Walk 10 feet on uneven surface  activity   Assist     Assist level: Minimal Assistance - Patient > 75%     Wheelchair     Assist Is the patient using a wheelchair?: No             Wheelchair 50 feet with 2 turns activity    Assist            Wheelchair 150 feet activity     Assist          Blood pressure 100/76, pulse 88, temperature 98.3 F  (36.8 C), temperature source Oral, resp. rate 16, height 5' 2.99" (1.6 m), weight 62.5 kg, last menstrual period 07/25/1998, SpO2 97 %.  Medical Problem List and Plan: 1. Functional deficits secondary to C5-6 fracture status post 3 staged anterior posterior fusion C5-6 corpectomy, posterior fusion C3-7, ACDF C3-7 12/29/2021 with postoperative left hand weakness consistent with C8 radiculopathy status post revision posterior cervical instrumentation 12/30/2021 for misplacement of thoracic pedicle screw.  Cervical collar when out of bed.             -patient may shower             -ELOS/Goals: 7 days            Con't CIR- PT and OT- team conference today to determine length of stay 2.  Antithrombotics: -DVT/anticoagulation:  Mechanical: Antiembolism stockings, thigh (TED hose) Bilateral lower extremities.  Check vascular study             -antiplatelet therapy: N/A 6/17 b/l LE ultrasound with no evidence of DVT  6/19- will start Lovenox 40 mg daily- has been Greater than 7 days.   6/20- d/w pt need for Lovenox since not walking as far as prior.  3. Pain Management: Lidoderm patch, Lyrica 150 mg twice daily, Flexeril as needed, MS IR 15 mg every 4 hours as needed  6/19- still having myofascial pain- will do trigger point injections  6/20- increased lidocaine patches to 3 patches- wait on TrP injections.  4. Mood/Sleep/bipolar disorder: Xanax 0.5 mg nightly,Escopiclone 3 mg nightly, Lamictal 200 mg daily,             -antipsychotic agents: Abilify 5 mg nightly 5. Neuropsych/cognition: This patient is capable of making decisions on her own behalf. 6. Skin/Wound Care: Routine skin checks 7. Fluids/Electrolytes/Nutrition: Routine in and outs with follow-up chemistries 8.  Constipation.  MiraLAX twice daily, Movantik 12.5 mg daily, Linzess 290 mcg daily, Colace 100 mg twice daily  6/18 BM today, improved  6/19- change Movantik and Linzess to 6am and Miralax Am dose to 6am- per pt home schedule. Will have  therapy not start til 8:30am  6/20- had good results with med changes-  9.  Hypertension.  Norvasc 7.5 mg daily.  Monitor with increased mobility  6/19- BP controlled- con't regimen 10.  Chronic anemia.  Continue iron supplement.  Follow-up CBC, last HGB 9.8 on 6/8  6/20- Hb 8.8- will monitor since has dropped some.  11.  Interstitial cystitis: continue Elmiron 200 mg twice daily 12.  GERD.  Continue Protonix  13.  Cholecystitis gallstones.  Continue Actigall 300 mg twice daily 14. Insomnia  -Cont home lunesta 15. Leukocytosis on prior labs 12/30/21  -6/19- WBC down to 7.8k- con't to monitor 16. Myofascial pain- will do Trigger point injections tomorrow if hasn't improved.   6/20- will wait on trigger point injections, but increase lidoderm patches to 3 patches.    I spent a total of 38   minutes on total care today- >50% coordination of care- due to team conference and d/w about pain- will wait on trigger point injections for now per pt request.    LOS: 4 days A FACE TO FACE EVALUATION WAS PERFORMED  Quintan Saldivar 01/11/2022, 8:52 AM

## 2022-01-11 NOTE — Progress Notes (Signed)
Physical Therapy Session Note  Patient Details  Name: Rachel Duran MRN: [id number] Date of Birth: Dec 04, 1955  Today's Date: 01/11/2022 PT Individual Time: 1546-1630 PT Individual Time Calculation (min): 44 min   Short Term Goals: Week 1:  PT Short Term Goal 1 (Week 1): STGs = LTGs  Skilled Therapeutic Interventions/Progress Updates:    Pt received seated at EOB and agrees to therapy. No complaint of pain. Sit to stand and stand step transfer to [facility] with cues for sequencing and positioning. Pt cued to turn and face away from [facility] to ensure that both legs are touching edge of chair before attempting to sit. WC transport to gym for time management. Stand pivot transfer to Nustep with improved sequencing and safety. Pt completes Nustep for strength and endurance training. Pt completes 2x3:00 and 1x4:00 at workload of 5 with steps per minute >70. HR checked immediately following final bout at 133 beats per minute. PT explains normal HR to pt and expectations for HR increase with activity.  Pt ambulates x175' without AD, and with CGA/minA for stability and to provide multimodal cueing for posture and navigation. Pt ambulates quickly and has slightly ataxic gait pattern.  Pt performs sidesteps in parallel bars with mirror for visual feedback. X50' total with bilateral upper extremity support on bar and cues for posture and performance. Pt progresses to performing with single upper extremity support to no upper extremity support. Pt noted to have increased ataxia/ difficulty coordinating steps with less upper extremity support, but no LOBs noted. WC transport back to room. Pt left seated in WC with alarm intact and all needs within reach.  Therapy Documentation Precautions:  Precautions Precautions: Cervical, Fall Precaution Comments: cervical Required Braces or Orthoses: Cervical Brace Cervical Brace: Hard collar Restrictions Weight Bearing Restrictions: No   Therapy/Group: Individual  Therapy  Breck Coons, PT, DPT 01/11/2022, 5:42 PM

## 2022-01-11 NOTE — Progress Notes (Signed)
Physical Therapy Session Note  Patient Details  Name: Rachel Duran MRN: [id number] Date of Birth: 09/14/1955  Today's Date: 01/11/2022 PT Individual Time: 0918-1030 PT Individual Time Calculation (min): 72 min   Short Term Goals: Week 1:  PT Short Term Goal 1 (Week 1): STGs = LTGs  Skilled Therapeutic Interventions/Progress Updates:    Pt received in recliner and agreeable to therapy. Reports 4/10 pain in shoulder blades, premedicated. Rest and positioning provided as needed.  Sit to stand with CGA to supervision with and without RW Pt ambulated forward and reverse 4 x 20 ft for improved coordination and dynamic balance, note dimproving coordination with repetition and no LOB. Pt then ambulated to day room with RW and CGA no knee buckle or LOB noted.  Side stepping around mat table with HHA x 2 bouts for improved lateral hip stability and coordination/balance, cued for incr glute med activation. Pt able to achieve neutral rotation with cueing to use glute med take note of proprioception at hip despite having poor foot proprioception. Pt requested to see imaging of her neck, so therapist showed pt imaging and went over imaging report. Discussed surgery and showed pt images of her type of surgery to increase independence and understanding of her care.  Step taps 4 x 20 with no UE support, light CGA. Noted min LOB which pt was able to correct with CGA to light min A.  Noted improving balance and fewer LOB by last bouts.  Gait without RW x 20 and x 150 ft with CGA. 1 LOB with mod A to recover. Pt able to notice and self correct narrow BOS without cueing. Pt returned to her room and to recliner, was left with all needs in reach and alarm active.    Therapy Documentation Precautions:  Precautions Precautions: Cervical, Fall Precaution Comments: cervical Required Braces or Orthoses: Cervical Brace Cervical Brace: Hard collar Restrictions Weight Bearing Restrictions: No General:        Therapy/Group: Individual Therapy  Mickel Fuchs 01/11/2022, 9:55 AM

## 2022-01-11 NOTE — Plan of Care (Signed)
  Problem: RH SKIN INTEGRITY Goal: RH STG ABLE TO PERFORM INCISION/WOUND CARE W/ASSISTANCE Description: STG Able To Perform Incision/Wound Care With Supervision Assistance. Outcome: Not Progressing   Problem: RH SAFETY Goal: RH STG ADHERE TO SAFETY PRECAUTIONS W/ASSISTANCE/DEVICE Description: STG Adhere to Safety Precautions With Cues and Reminders. Outcome: Not Progressing

## 2022-01-11 NOTE — Progress Notes (Signed)
Occupational Therapy Session Note  Patient Details  Name: Deavion Strider MRN: [id number] Date of Birth: [birth year]  Today's Date: 01/11/2022 OT Individual Time: 1415-1530 OT Individual Time Calculation (min): 75 min    Short Term Goals: Week 1:  OT Short Term Goal 1 (Week 1): STG = LTG 2/2 ELOS  Skilled Therapeutic Interventions/Progress Updates:  Pt seen for OT this pm for full self care training session. Pt seated in recliner agreeable to showering this session however just had Lidoderm patches placed earlier for UE pain management and OT offered to cover for moisture protection. OT reviewed spinal precautions and pt able to verbalize back without cues 3/3.  Pt amb with OT and RW with CGA to and from stall shower with shower bench and grab bar in place. Once seated on bench, OT provided occ min A for pt to doff UB and LB clothing with min A for Aspen neck collar removal and re-donning post shower. Pt completed UB bathing in shower seated with min A/CGA due to difficulty using L UE and LB bathing with min A to reach L LE's with newly issued LH sponge.  OT assisted to wash hair, dry body and amb back to EOB to dress. Required min A for bra donning, set up and CGA for pull over shirt and don LB garments including socks and tennis shoes with cross legged technique and stood pull up garments with RW support. Overall functional use of L UE and integration progressed from 50% to 75% accuracy overall. Pt requested to stretch out in bed following session and reported no more than 2-3/10 pain in UEs, OT placed all needs and call bell left within reach.   Therapy Documentation Precautions:  Precautions Precautions: Cervical, Fall Precaution Comments: cervical Required Braces or Orthoses: Cervical Brace Cervical Brace: Hard collar Restrictions Weight Bearing Restrictions: No    Therapy/Group: Individual Therapy  Barnabas Lister 01/11/2022, 12:49 PM

## 2022-01-12 MED ORDER — CYCLOBENZAPRINE HCL 10 MG PO TABS
10.0000 mg | ORAL_TABLET | Freq: Three times a day (TID) | ORAL | Status: DC
  Administered 2022-01-12 – 2022-01-15 (×10): 10 mg via ORAL
  Filled 2022-01-12 (×10): qty 1

## 2022-01-12 NOTE — Progress Notes (Signed)
PROGRESS NOTE   Subjective/Complaints:  Pt reports LBM yesterday- waiting to go today after bowel meds at [place] yesterday- went well.  Liked the lidoderm 3 patches ,but when taken off last night, pain much worse overnight.  Interested in trigger point injections tomorrow.   Also getting flexeril prn- asking if can increase dose?  ROS:  Pt denies SOB, abd pain, CP, N/V/C/D, and vision changes   Objective:   No results found.  Recent Labs    01/10/22 0615  WBC 7.8  HGB 8.8*  HCT 26.3*  PLT 361   Recent Labs    01/10/22 0615  NA 134*  K 4.2  CL 100  CO2 27  GLUCOSE 102*  BUN 7*  CREATININE 0.49  CALCIUM 8.8*    Intake/Output Summary (Last 24 hours) at 01/12/2022 0916 Last data filed at 01/11/2022 2154 Gross per 24 hour  Intake 530 ml  Output --  Net 530 ml        Physical Exam: Vital Signs Blood pressure 107/61, pulse 86, temperature 97.8 F (36.6 C), temperature source Oral, resp. rate 15, height 5' 2.99" (1.6 m), weight 62.5 kg, last menstrual period 07/25/1998, SpO2 96 %. Physical Exam:      General: awake, alert, appropriate, sitting up in bed; NAD HENT: conjugate gaze; oropharynx moist- wearing cervical collar CV: regular rate; no JVD Pulmonary: CTA B/L; no W/R/R- good air movement GI: soft, NT, ND, (+)BS Psychiatric: appropriate- interactive Neurological: Ox3 MS: trigger points still in rhomboids, cervical paraspinals, splenius capitus and levators/upper traps/scalenes Skin: anterior neck incision with steri-strips- looks great Neurological:     Comments: Patient is alert.  Oriented x3 and follows commands. 5/5 strength throughout with the exception of left hand grip 4/5.     Assessment/Plan: 1. Functional deficits which require 3+ hours per day of interdisciplinary therapy in a comprehensive inpatient rehab setting. Physiatrist is providing close team supervision and 24 hour  management of active medical problems listed below. Physiatrist and rehab team continue to assess barriers to discharge/monitor patient progress toward functional and medical goals  Care Tool:  Bathing  Bathing activity did not occur: Refused (pt already dressed) Body parts bathed by patient: Right arm, Left arm, Chest, Abdomen, Right upper leg, Left upper leg, Face, Front perineal area, Buttocks, Right lower leg, Left lower leg   Body parts bathed by helper: Right lower leg, Left lower leg     Bathing assist Assist Level: Contact Guard/Touching assist     Upper Body Dressing/Undressing Upper body dressing   What is the patient wearing?: Bra, Pull over shirt Orthosis activity level: Performed by helper  Upper body assist Assist Level: Minimal Assistance - Patient > 75%    Lower Body Dressing/Undressing Lower body dressing      What is the patient wearing?: Underwear/pull up, Pants     Lower body assist Assist for lower body dressing: Supervision/Verbal cueing     Toileting Toileting Toileting Activity did not occur (Clothing management and hygiene only): N/A (no void or bm)  Toileting assist Assist for toileting: Independent     Transfers Chair/bed transfer  Transfers assist     Chair/bed transfer assist level:  Contact Guard/Touching assist     Locomotion Ambulation   Ambulation assist      Assist level: Moderate Assistance - Patient 50 - 74% Assistive device: No Device Max distance: 150'   Walk 10 feet activity   Assist     Assist level: Contact Guard/Touching assist Assistive device: No Device   Walk 50 feet activity   Assist    Assist level: Moderate Assistance - Patient - 50 - 74% Assistive device: No Device    Walk 150 feet activity   Assist    Assist level: Moderate Assistance - Patient - 50 - 74% Assistive device: No Device    Walk 10 feet on uneven surface  activity   Assist     Assist level: Minimal Assistance -  Patient > 75%     Wheelchair     Assist Is the patient using a wheelchair?: No             Wheelchair 50 feet with 2 turns activity    Assist            Wheelchair 150 feet activity     Assist          Blood pressure 107/61, pulse 86, temperature 97.8 F (36.6 C), temperature source Oral, resp. rate 15, height 5' 2.99" (1.6 m), weight 62.5 kg, last menstrual period 07/25/1998, SpO2 96 %.  Medical Problem List and Plan: 1. Functional deficits secondary to C5-6 fracture status post 3 staged anterior posterior fusion C5-6 corpectomy, posterior fusion C3-7, ACDF C3-7 12/29/2021 with postoperative left hand weakness consistent with C8 radiculopathy status post revision posterior cervical instrumentation 12/30/2021 for misplacement of thoracic pedicle screw.  Cervical collar when out of bed.             -patient may shower             -ELOS/Goals: 7 days            Con't CIR- PT and OT- will do myofascial release today D/c date 6/27 2.  Antithrombotics: -DVT/anticoagulation:  Mechanical: Antiembolism stockings, thigh (TED hose) Bilateral lower extremities.  Check vascular study             -antiplatelet therapy: N/A 6/17 b/l LE ultrasound with no evidence of DVT  6/19- will start Lovenox 40 mg daily- has been Greater than 7 days.   6/20- d/w pt need for Lovenox since not walking as far as prior.  3. Pain Management: Lidoderm patch, Lyrica 150 mg twice daily, Flexeril as needed, MS IR 15 mg every 4 hours as needed  6/19- still having myofascial pain- will do trigger point injections  6/20- increased lidocaine patches to 3 patches- wait on TrP injections.   6/21- will change flexeril to scheduled 10 mg TID- since doesn't make sleepy 4. Mood/Sleep/bipolar disorder: Xanax 0.5 mg nightly,Escopiclone 3 mg nightly, Lamictal 200 mg daily,             -antipsychotic agents: Abilify 5 mg nightly 5. Neuropsych/cognition: This patient is capable of making decisions on her own  behalf. 6. Skin/Wound Care: Routine skin checks 7. Fluids/Electrolytes/Nutrition: Routine in and outs with follow-up chemistries 8.  Constipation.  MiraLAX twice daily, Movantik 12.5 mg daily, Linzess 290 mcg daily, Colace 100 mg twice daily  6/18 BM today, improved  6/19- change Movantik and Linzess to 6am and Miralax Am dose to 6am- per pt home schedule. Will have therapy not start til 8:30am  6/20- had good results with med changes-  6/21- waiting to go today 9.  Hypertension.  Norvasc 7.5 mg daily.  Monitor with increased mobility  6/19- BP controlled- con't regimen 10.  Chronic anemia.  Continue iron supplement.  Follow-up CBC, last HGB 9.8 on 6/8  6/20- Hb 8.8- will monitor since has dropped some.  11.  Interstitial cystitis: continue Elmiron 200 mg twice daily 12.  GERD.  Continue Protonix 13.  Cholecystitis gallstones.  Continue Actigall 300 mg twice daily 14. Insomnia  -Cont home lunesta 15. Leukocytosis on prior labs 12/30/21  -6/19- WBC down to 7.8k- con't to monitor 16. Myofascial pain- will do Trigger point injections tomorrow if hasn't improved.   6/20- will wait on trigger point injections, but increase lidoderm patches to 3 patches.   6/21- Lidoderm was helpful during day, but pain worse at night- wants trigger point injections tomorrow   I spent a total of 39   minutes on total care today- >50% coordination of care- due to seeing pt 2x- speaking with PT about myofascial release on rhomboids, etc.    LOS: 5 days A FACE TO FACE EVALUATION WAS PERFORMED  Quinterious Walraven 01/12/2022, 9:16 AM

## 2022-01-12 NOTE — Progress Notes (Signed)
Recreational Therapy Session Note  Patient Details  Name: Rachel Duran MRN: [id number] Date of Birth: 1956-06-11 Today's Date: 01/12/2022  Pt participated in animal assisted activity seated in the recliner with supervision.   Pt easily engaged in conversation and interaction with the therapy dog.  Pt sharing that she owns 2 dogs at home and competes in agility with them.  Pt provided with [facility]'s Pet Visitation policy as she would like for her dogs to possibly be brought in to visit.  [place] 01/12/2022, 2:59 PM

## 2022-01-12 NOTE — Progress Notes (Signed)
Occupational Therapy Session Note  Patient Details  Name: Rachel Duran MRN: [id number] Date of Birth: [birth year]  Today's Date: 01/12/2022 OT Individual Time: 1300-1415 OT Individual Time Calculation (min): 75 min    Short Term Goals: Week 1:  OT Short Term Goal 1 (Week 1): STG = LTG 2/2 ELOS  Skilled Therapeutic Interventions/Progress Updates:  Pt up in recliner eagerly awaiting skilled OT session. Focus of session on L UE NMRE with functional progression, balance and positioning and pain management. Pt able to ambulate with RW ~ 150 ft x 2 to and from middle therapy gym in long hallway and sit on edge of mat with overall CGA fading to Close S except on turns and obstacle negotiation. Sit to and from supine on mat with wedge and pillow with close S and no cues for spinal prec integration and good body mechanics. Pt with 3/10 pain reported in B shoulders and scapulae- R>L. Moist heat with skin barrier applied for 10 min to L scap/sh region and OT applied SaeboStim One unit with same parameters as previous application to forearm for flexor and extensor activation x 10 min in each location of L UE. No skin issues noted after completion. OT applied small region of k tape to scap retractors and scap elevators. Pt then tolerated yellow (light) tband for elbow extensors 10 reps Bly in gravity minimized plane. Pt was then able to amb to table and sit in regular arm chair for resistance pin grasp training with difficulty past light level. Pt then worked on standing balance for finger walking ROM ladder with approximately 0-110 sh flexion and decreased fluidity and accuracy for finger mobility x 2 trials. Once back to room, pt left seated in recliner with nursing call bell and needs in place. Pain never increased above 3/10 with activity presented.   Therapy Documentation Precautions:  Precautions Precautions: Cervical, Fall Precaution Comments: cervical Required Braces or Orthoses: Cervical  Brace Cervical Brace: Hard collar Restrictions Weight Bearing Restrictions: No    Therapy/Group: Individual Therapy  Barnabas Lister 01/12/2022, 4:38 PM

## 2022-01-12 NOTE — Progress Notes (Signed)
Occupational Therapy Session Note  Patient Details  Name: Rachel Duran MRN: [id number] Date of Birth: 1955/09/24  Today's Date: 01/12/2022 OT Individual Time: 1100-1200 OT Individual Time Calculation (min): 60 min    Short Term Goals: Week 1:  OT Short Term Goal 1 (Week 1): STG = LTG 2/2 ELOS Week 2:     Skilled Therapeutic Interventions/Progress Updates:    Patient seated in recfliner speaking with the pet therapy person and [name] her dog.  The pt was eager to engage in the process.  The pt was seated throughout the process with her collar in place. The pt went on to come from sit to stand using the RW for balance with CGA, the pt was instructed to take her time and to analyze how she is feeling internally, is she dizzy prior to transferring from a stationary position.  The pt was able to ambulate to the sink area using the RW with CGA to wash her hands.  The pt was able to ambulate to the transition room using the RW to determine the more appropriate way to approach her environment at home.  The pt was instructed on various adaptive techniques that she could implement to improve her independence while adhering to her precautions.  The pt was instructed in meal prep techniques inclusive of a caddy, using the middle shelf in the refrigerator to slide oppose to pulling, approaching the shower using easy to install grab bars.  The pt was very receptive to the various technique and indicated that she felt more empowered.  The pt was able to demonstrate functional mobility using the RW to walk back to her room, which was greater that 158f with CGA and verbal prompts for pacing herself.  The pt returned to the recliner with her call bell and bedside table within reach and her alarm in place.  The pt had no c/o of pain this OT treatment session.     Therapy Documentation Precautions:  Precautions Precautions: Cervical, Fall Precaution Comments: cervical Required Braces or Orthoses: Cervical  Brace Cervical Brace: Hard collar Restrictions Weight Bearing Restrictions: No Other Treatments:     Therapy/Group: Individual Therapy  LYvonne Kendall6/21/2023, 1:02 PM

## 2022-01-12 NOTE — Progress Notes (Signed)
Physical Therapy Session Note  Patient Details  Name: Rachel Duran MRN: [id number] Date of Birth: [birth year]  Today's Date: 01/12/2022 PT Individual Time: 0830-1000 PT Individual Time Calculation (min): 90 min   Short Term Goals: Week 1:  PT Short Term Goal 1 (Week 1): STGs = LTGs  Skilled Therapeutic Interventions/Progress Updates:    Pt received sitting EOB with nsg present for meds pass and while completing dressing. Pt doffed button up shirt with supervision and donned clean shirt with assist only to adjust brace. Pt reports improving pain today, with nsg applying lidocaine patches. Pt then ambulated >500 ft to [employer]	[place] for endurance and functional mobility. Noted decreased ataxia from previous day, CGA fading to supervision at times.Pt able to recall and implement gait corrections. During seated rest break, pt expressed concerns about RW and prognosis. Therapist provided active listening, reassurance, and education on RW as tool for independence and prognosis. Pt expressed feeling better after conversation. Pt then ambulated back to main therapy gym in same manner. Pt navigated 6" stairs, 2 x 12 with 2 hand rails and CGA, pt with improved coordination and confidence with stair navigation. Pt also navigated ramp and curb step with CGA and cueing for sequencing and safety. Per MD request, therapist provided triggerpoint release to upper traps. Supine<>sit with cueing for log roll technique. Triggerpoint release provided in BIL UT, L>R. Pt reports familiar symptoms with palpation. Provided education on visualization techniques and pain science education for improved self management of pain and muscular tension. Pt expressed relief after treatment and agreeable to trying visualization techniques on her own. Pt ambulated back to room and returned to recliner, was left with all needs in reach and alarm active.   Therapy Documentation Precautions:  Precautions Precautions: Cervical,  Fall Precaution Comments: cervical Required Braces or Orthoses: Cervical Brace Cervical Brace: Hard collar Restrictions Weight Bearing Restrictions: No General:       Therapy/Group: Individual Therapy  Mickel Fuchs 01/12/2022, 12:42 PM

## 2022-01-13 LAB — CBC WITH DIFFERENTIAL/PLATELET
Abs Immature Granulocytes: 0.11 10*3/uL — ABNORMAL HIGH (ref 0.00–0.07)
Basophils Absolute: 0.1 10*3/uL (ref 0.0–0.1)
Basophils Relative: 1 %
Eosinophils Absolute: 0.6 10*3/uL — ABNORMAL HIGH (ref 0.0–0.5)
Eosinophils Relative: 8 %
HCT: 26.9 % — ABNORMAL LOW (ref 36.0–46.0)
Hemoglobin: 8.6 g/dL — ABNORMAL LOW (ref 12.0–15.0)
Immature Granulocytes: 2 %
Lymphocytes Relative: 39 %
Lymphs Abs: 2.6 10*3/uL (ref 0.7–4.0)
MCH: 28.2 pg (ref 26.0–34.0)
MCHC: 32 g/dL (ref 30.0–36.0)
MCV: 88.2 fL (ref 80.0–100.0)
Monocytes Absolute: 0.5 10*3/uL (ref 0.1–1.0)
Monocytes Relative: 7 %
Neutro Abs: 2.9 10*3/uL (ref 1.7–7.7)
Neutrophils Relative %: 43 %
Platelets: 336 10*3/uL (ref 150–400)
RBC: 3.05 MIL/uL — ABNORMAL LOW (ref 3.87–5.11)
RDW: 13.9 % (ref 11.5–15.5)
WBC: 6.7 10*3/uL (ref 4.0–10.5)
nRBC: 0 % (ref 0.0–0.2)

## 2022-01-13 LAB — BASIC METABOLIC PANEL
Anion gap: 7 (ref 5–15)
BUN: <5 mg/dL — ABNORMAL LOW (ref 8–23)
CO2: 27 mmol/L (ref 22–32)
Calcium: 8.7 mg/dL — ABNORMAL LOW (ref 8.9–10.3)
Chloride: 100 mmol/L (ref 98–111)
Creatinine, Ser: 0.46 mg/dL (ref 0.44–1.00)
GFR, Estimated: >60 mL/min (ref >60)
Glucose, Bld: 97 mg/dL (ref 70–99)
Potassium: 4.3 mmol/L (ref 3.5–5.1)
Sodium: 134 mmol/L — ABNORMAL LOW (ref 135–145)

## 2022-01-13 MED ORDER — LIDOCAINE HCL 1 % IJ SOLN
10.0000 mL | Freq: Once | INTRAMUSCULAR | Status: AC
  Administered 2022-01-13: 10 mL
  Filled 2022-01-13: qty 10

## 2022-01-13 MED ORDER — PSYLLIUM 95 % PO PACK
1.0000 | PACK | Freq: Every day | ORAL | Status: DC
  Administered 2022-01-14 – 2022-01-15 (×2): 1 via ORAL
  Filled 2022-01-13 (×2): qty 1

## 2022-01-13 NOTE — Progress Notes (Signed)
Patient ID: Rachel Duran, female   DOB: 09-28-1955, 66 y.o.   MRN: [id number]  Husband to come in tomorrow from 10-12 for hands on education in preparation for discharge next week.

## 2022-01-13 NOTE — Progress Notes (Signed)
Occupational Therapy Session Note  Patient Details  Name: Rachel Duran MRN: [id number] Date of Birth: May 01, 1956  Today's Date: 01/13/2022 OT Individual Time: 1400-1515 OT Individual Time Calculation (min): 75 min    Short Term Goals: Week 1:  OT Short Term Goal 1 (Week 1): STG = LTG 2/2 ELOS  Skilled Therapeutic Interventions/Progress Updates:    S: Patient agreeable to participate in OT session. Reports 4/10 pain level left upper trapezius/scapular region. Pt received trigger point injection earlier today. Monitored pain level during session.   Patient participated in skilled OT session focusing on high level  balance activity in ADL kitchen while locating 17 pieces of fruit which were placed in several high and moderately low level areas while maintaining cervical precautions. Therapist provided education and suggestions regarding use of appliances when pulling open the fridge/freezer, microwave, and stove door while maintaining precautions. Completed laundry task of folding wash clothes, towels, and pillowcases (approximately 10 total) while standing initially then seated to finish due to patient's reporting increased discomfort in left scapular region. Fine motor coordination task completed while seated using push pin flower pattern and Left hand only focusing on isolated finger movement and pinch strenth. Therapist educated patient to rest forearm on table once push pin was retrieved from container prior to placement on pattern to provide increased UE stability and decrease the amount of stress placed on left shoulder. (Pt completed half of flower pattern with left hand due to report of increasing pain). Finished task with right hand with patient keeping forearm off table top to focus on shoulder/scapular stability along with fine motor coordination and pinch strength.  Focusing on coordination, patient completed right hand thumb dealing (half deck completed) and with remainder of  deck completed card flipping task (with right hand). Pt then placed all cards back into box when finished. VC were provided for form and technique. Fine Motor Coordination HEP was provided. Pt was provided verbal instructions with handout. Pt verbalized understanding.    Therapy Documentation Precautions:  Precautions Precautions: Cervical, Fall Precaution Comments: cervical Required Braces or Orthoses: Cervical Brace Cervical Brace: Hard collar Restrictions Weight Bearing Restrictions: No   Therapy/Group: Individual Therapy  Ailene Ravel, OTR/L,CBIS  Supplemental OT - [facility]  01/13/2022, 7:57 AM

## 2022-01-13 NOTE — Patient Instructions (Signed)
  Coordination Activities  Perform the following activities for 5-10 minutes 2-3 times per day with both hand(s).  Rotate ball in fingertips (clockwise and counter-clockwise). Toss ball between hands. Flip cards 1 at a time as fast as you can. Deal cards with your thumb (Hold deck in hand and push card off top with thumb). Pick up coins, buttons, marbles, dried beans/pasta of different sizes and place in container. Pick up coins one at a time until you get 5-10 in your hand, then move coins from palm to fingertips to stack one at a time. Twirl pen between fingers. Practice writing and/or typing.

## 2022-01-13 NOTE — Evaluation (Signed)
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 C6- C7 -hx. past fusion(bone graft used)   Cholecystitis     Chronic pain     DDD (degenerative disc disease), lumbar 09/2015    dextroscoliosis with multilevel DDD and facet arthrosis most notable for R foraminal disc protrusion L4/5 producing severe R neural foraminal stenosis abutting R L4 nerve root, moderate spinal canal and mild lat recesss and R neural foraminal stenosis L3/4 (MRI)   Depression      bipolar depression   Disorders of porphyrin metabolism     Felon of finger of left hand 11/10/2016   Fibromyalgia     GERD (gastroesophageal reflux disease)     Headache      occasionally   Internal hemorrhoids     Interstitial cystitis 06/06/2012    hx.   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 (Dr Baird Cancer retinologist)   Tuberculosis      Past Surgical History:       Past Surgical History:  Procedure Laterality Date   ANTERIOR CERVICAL DECOMP/DISCECTOMY FUSION   2004    C5/6, C6/7   ANTERIOR CERVICAL  DECOMP/DISCECTOMY FUSION   02/2016    C3/4, C4/5 with plating [name])   ANTERIOR CERVICAL DECOMP/DISCECTOMY FUSION   12/29/2021    Procedure: ANTERIOR CERVICAL DECOMPRESSION/ DISCECTOMY FUSION CERVICAL THREE - SEVEN;  Surgeon: Newman Pies, MD;  Location: [place];  Service: Neurosurgery;;   ANTERIOR CERVICAL DECOMPRESSION/DISCECTOMY FUSION 4 LEVELS N/A 12/29/2021    Procedure: CERVICAL FIVE-SIX CORPECTOMY,;  Surgeon: Newman Pies, MD;  Location: [place];  Service: Neurosurgery;  Laterality: N/A;   AUGMENTATION MAMMAPLASTY Bilateral 03/25/2010   BREAST ENHANCEMENT SURGERY   2010   BREAST IMPLANT EXCHANGE   10/2014    exchange saline implants, B mastopexy/capsulorraphy ([place])   BUNIONECTOMY Bilateral yrs ago   [place]    x 1   CHOLECYSTECTOMY   06/11/2012    Procedure: LAPAROSCOPIC CHOLECYSTECTOMY WITH INTRAOPERATIVE CHOLANGIOGRAM;  Surgeon: [name], MD,FACS;  Location: [facility];  Service: General;  Laterality: N/A;   COLONOSCOPY   02/2018    done for positive cologuard - WNL, rpt 10 yrs [facility] Plan)   CYSTOSCOPY       ERCP   05/22/2012    Procedure: ENDOSCOPIC RETROGRADE CHOLANGIOPANCREATOGRAPHY (ERCP);  Surgeon: [name], MD,FACG;  Location: [facility];  Service: Endoscopy;  Laterality: N/A;   ERCP N/A 09/17/2013    Procedure: ENDOSCOPIC RETROGRADE CHOLANGIOPANCREATOGRAPHY (ERCP);  Surgeon: [name], MD;  Location: [facility];  Service: Endoscopy;  Laterality: N/A;   ERCP N/A 09/27/2018    Procedure: ENDOSCOPIC RETROGRADE CHOLANGIOPANCREATOGRAPHY (ERCP);  Surgeon: [name], MD;  Location: [facility];  Service: Endoscopy;  Laterality: N/A;   ESOPHAGOGASTRODUODENOSCOPY   09/2016    WNL. esophagus dilated [facility])   HEMORRHOID BANDING   09/23/2013    --Dr. Greer Pickerel   HERNIA REPAIR        inguinal   HYSTEROSCOPY W/ ENDOMETRIAL ABLATION       KNEE ARTHROSCOPY Right 04/06/2021    Procedure: RIGHT KNEE ARTHROSCOPY WITH DEBRIDEMENT;   Surgeon: [name], MD;  Location: [place];  Service: Orthopedics;  Laterality: Right;   NASAL SINUS SURGERY        x5   PARTIAL KNEE ARTHROPLASTY Right 06/01/2020    Procedure: Right knee patellofemoral replacement;  Surgeon: [name], MD;  Location: [place];  Service: Orthopedics;  Laterality: Right;   POSTERIOR CERVICAL FUSION/FORAMINOTOMY N/A 12/29/2021    Procedure: POSTERIOR CERVICAL FUSION CERVICAL THREE-SEVEN;  Surgeon: Newman Pies, MD;  Location: [place];  Service: Neurosurgery;  Laterality: N/A;   REMOVAL OF STONES   09/27/2018    Procedure: REMOVAL OF STONES;  Surgeon: [name], MD;  Location: [facility];  Service: Endoscopy;;   SPHINCTEROTOMY   09/27/2018    Procedure: SPHINCTEROTOMY;  Surgeon: [name], MD;  Location[facility];  Service: Endoscopy;;   TONSILLECTOMY        removed as a child   TOTAL HIP ARTHROPLASTY Left 03/12/2019    Procedure: LEFT TOTAL HIP ARTHROPLASTY ANTERIOR APPROACH;  [name], [name], MD)   UPPER GASTROINTESTINAL ENDOSCOPY          Assessment & Plan Clinical Impression: Patient is a 66 y.o. year old female with history of chronic anemia, bipolar disorder/depression/fibromyalgia, ACDF 2004 C5-6 and C6-7 and 2017 C3-4 and C4-5, left total hip arthroplasty 2020, scleroderma.  Per chart review patient lives with spouse.  Two-level home bed and bath on main level with level entry.  Independent prior to admission and driving.  Presented 12/29/2021 with noted history of fall 10/29/2021 suffering a C5-6 fracture subluxation/cervical kyphosis with persistent neck pain and initial conservative care with failed medical management.  Underwent 3 stage anterior posterior fusion C5-6 corpectomy, posterior fusion C3-7, ACDF C3-7 12/29/2021.  Postop she developed left hand weakness consistent with a C8 radiculopathy as well as small amount of epidural fluid at C5-6.  Cervical CT demonstrated that  the left T1 pedicle screw was a bit low and into the neural foramen.  Patient underwent revision of posterior cervical instrumentation 12/30/2021 per Dr. Newman Pies.  Placed in a cervical hard collar when out of bed.  Hospital course pain management.  Acute blood loss anemia 9.8 and monitored.  Tolerating a regular consistency diet.  Therapy evaluations completed due to patient decreased functional mobility was admitted for a comprehensive rehab program. Currently complains of left hand weakness.  Patient transferred to CIR on 01/07/2022 .     Pt presents with decreased activity tolerance, decreased functional mobility, decreased balance, decreased coordination, and chronic and acute pain, feelings of stress Limiting pt's independence with leisure/community pursuits.  Met with pt today to discuss TR services including leisure education, activity analysis/modifications  and stress management.  Also discussed the importance of social, emotional, spiritual health in addition to physical health and their effects on overall health and wellness.  Pt stated understanding and appreciation of this visit.  Plan  Min 1 TR session >20 minutes during LOS  Recommendations for other services: None   Discharge Criteria: Patient will be discharged from TR if patient refuses treatment 3 consecutive times without medical reason.  If treatment goals not met, if there is a change in medical status, if patient makes no progress towards goals or if patient is discharged from hospital.  The above assessment, treatment plan, treatment alternatives and goals were discussed and mutually agreed upon: by patient  M[place] 01/13/2022, 9:41 AM

## 2022-01-13 NOTE — Progress Notes (Signed)
Physical Therapy Session Note  Patient Details  Name: Rachel Duran MRN: [id number] Date of Birth: [birth year]  Today's Date: 01/13/2022 PT Individual Time: 8182-9937 PT Individual Time Calculation (min): 58 min   Short Term Goals: Week 1:  PT Short Term Goal 1 (Week 1): STGs = LTGs  Skilled Therapeutic Interventions/Progress Updates:  Pt seated in recliner on arrival to room with hard cervical collar donned/intact. Agreeable to PT at this time.   TRANSFERS:  Supervision for sit<>stand tranfers through out session.    GAIT: Gait pattern: step through pattern, decreased stride length, ataxic, and narrow BOS Distance walked: community distances with only one rest break on 1st floor needed Assistive device utilized: Walker - 2 wheeled and cervical hard collar Level of assistance: SBA and CGA Comments: gait from pt's room to [employer] down to 1st floor. From elevators to atrium near gift shop/[employer]. One rest break needed on benches across from [facility] admission desk. Walking around 1st floor around admission dest on various tiled surfaces with pt needing to stop to adjust walker wheels as the got caught in grout of tiles. No balance loss noted. Walked back to central elevators and up to 4th floor. From there walked from central elevators down to mat table in dayroom. Ended with gait from dayroom back to pt's room at end of session.    NMR/BALANCE: Weaving around 4 cones with 180* turns min HHA for 3 laps total with cues to slow down and focus on step placement to decrease episodes of scissoring due to ataxia with improvement noted.  With airex: right HHS for forward stepping up onto<> backward down of off airex, leading with right foot x 5 reps, then left floor x 5 reps. Increased difficulty with stepping backwards with up to min assist needed for balance.  4 square stepping with therabands crossed on floor: HHA/min assist for 5 reps each way with cues for step length and height  to clear bands, up to min assist for balance. On airex feet hip width apart- heel/toe raises x 15 reps, then after a rest break slow alternating marching x 10 reps. Pt with  light fingertip support on chair back with both, min guard assist for safety. 2 cones on floor: alternating fwd foot taps, then cross foot taps, then foot taps fwd>lateral to cones HHA/min assist, bloc practice with each of these with rest breaks taken as needed.  Pt left in recliner in room with all needs in reach.     Therapy Documentation Precautions:  Precautions Precautions: Cervical, Fall Precaution Comments: cervical Required Braces or Orthoses: Cervical Brace Cervical Brace: Hard collar Restrictions Weight Bearing Restrictions: No General:   Vital Signs: Therapy Vitals Temp: 98 F (36.7 C) Pulse Rate: 89 Resp: 18 BP: 124/75 Patient Position (if appropriate): Sitting Oxygen Therapy SpO2: 98 % O2 Device: Room Air Pain: Pain Assessment Pain Scale: 0-10 Pain Score: 7  Pain Type: Acute pain;Surgical pain Pain Location: Shoulder Pain Orientation: Right;Left Pain Descriptors / Indicators: Aching Pain Frequency: Intermittent Pain Onset: On-going Patients Stated Pain Goal: 4 Pain Intervention(s): Medication (See eMAR)    Therapy/Group: Individual Therapy  Willow Ora, PTA, [place] 01/13/22, 4:36 PM

## 2022-01-14 MED ORDER — NALOXEGOL OXALATE 12.5 MG PO TABS: 12.5000 mg | ORAL_TABLET | Freq: Every day | ORAL | 0 refills | Status: DC

## 2022-01-14 MED ORDER — AMLODIPINE BESYLATE 2.5 MG PO TABS: 7.5000 mg | ORAL_TABLET | Freq: Every day | ORAL | 0 refills | Status: DC

## 2022-01-14 MED ORDER — ALPRAZOLAM 1 MG PO TABS: 0.5000 mg | ORAL_TABLET | Freq: Every evening | ORAL | 0 refills | Status: AC

## 2022-01-14 MED ORDER — CYCLOBENZAPRINE HCL 10 MG PO TABS
10.0000 mg | ORAL_TABLET | Freq: Three times a day (TID) | ORAL | 0 refills | Status: DC | PRN
Start: 2022-01-14 — End: 2022-04-20

## 2022-01-14 MED ORDER — MORPHINE SULFATE 15 MG PO TABS: 15.0000 mg | ORAL_TABLET | ORAL | 0 refills | Status: DC | PRN

## 2022-01-14 MED ORDER — PREGABALIN 150 MG PO CAPS: 150.0000 mg | ORAL_CAPSULE | Freq: Two times a day (BID) | ORAL | 0 refills | Status: DC

## 2022-01-14 MED ORDER — LAMOTRIGINE 200 MG PO TABS
200.0000 mg | ORAL_TABLET | Freq: Every day | ORAL | 0 refills | Status: AC
Start: 2022-01-14 — End: ?

## 2022-01-14 MED ORDER — ACETAMINOPHEN 325 MG PO TABS: 650.0000 mg | ORAL_TABLET | ORAL | Status: DC | PRN

## 2022-01-14 MED ORDER — LINACLOTIDE 290 MCG PO CAPS
290.0000 ug | ORAL_CAPSULE | Freq: Every day | ORAL | 1 refills | Status: DC
Start: 2022-01-14 — End: 2022-03-01

## 2022-01-14 MED ORDER — FERROUS SULFATE 325 (65 FE) MG PO TABS: 325.0000 mg | ORAL_TABLET | ORAL | 3 refills | Status: DC

## 2022-01-14 MED ORDER — LIDOCAINE 5 % EX PTCH: 3.0000 | MEDICATED_PATCH | Freq: Every day | CUTANEOUS | 0 refills | Status: DC

## 2022-01-14 MED ORDER — PANTOPRAZOLE SODIUM 40 MG PO TBEC
DELAYED_RELEASE_TABLET | ORAL | 0 refills | Status: DC
Start: 2022-01-14 — End: 2022-06-22

## 2022-01-14 MED ORDER — SORBITOL 70 % SOLN
30.0000 mL | Freq: Once | Status: AC
  Administered 2022-01-14: 30 mL via ORAL
  Filled 2022-01-14: qty 30

## 2022-01-14 NOTE — Discharge Summary (Signed)
Physician Discharge Summary  Patient ID: Rachel Duran MRN: [id number] DOB/AGE: 64/03/1956 64 y.o.  Admit date: 01/07/2022 Discharge date: 01/15/2022  Discharge Diagnoses:  Principal Problem:   Radiculopathy Active Problems:   Insomnia   Constipation   Fusion of spine, cervical region   Anemia   Primary hypertension DVT prophylaxis GERD Cholecystitis gallstones Bipolar disorder Scleroderma  Discharged Condition: Stable  Significant Diagnostic Studies: VAS [place] LOWER EXTREMITY VENOUS (DVT)  Result Date: 01/08/2022  Lower Venous DVT Study Patient Name:  Rachel Duran [facility]  Date of Exam:   01/07/2022 Medical Rec #: [id number]              Accession #:    [id number] Date of Birth: [birth year]               Patient Gender: F Patient Age:   13 years Exam Location:  [facility] Procedure:      VAS [place] LOWER EXTREMITY VENOUS (DVT) Referring Phys: Mariam Dollar --------------------------------------------------------------------------------  Indications: Swelling.  Comparison Study: No previous exam noted. Performing Technologist: Magdalene River  Examination Guidelines: A complete evaluation includes B-mode imaging, spectral Doppler, color Doppler, and power Doppler as needed of all accessible portions of each vessel. Bilateral testing is considered an integral part of a complete examination. Limited examinations for reoccurring indications may be performed as noted. The reflux portion of the exam is performed with the patient in reverse Trendelenburg.  +---------+---------------+---------+-----------+----------+--------------+ RIGHT    CompressibilityPhasicitySpontaneityPropertiesThrombus Aging +---------+---------------+---------+-----------+----------+--------------+ CFV      Full           Yes      Yes                                 +---------+---------------+---------+-----------+----------+--------------+ SFJ      Full                                                         +---------+---------------+---------+-----------+----------+--------------+ FV Prox  Full                                                        +---------+---------------+---------+-----------+----------+--------------+ FV Mid   Full                                                        +---------+---------------+---------+-----------+----------+--------------+ FV DistalFull                                                        +---------+---------------+---------+-----------+----------+--------------+ PFV      Full                                                        +---------+---------------+---------+-----------+----------+--------------+  POP      Full           Yes      Yes                                 +---------+---------------+---------+-----------+----------+--------------+ PTV      Full                                                        +---------+---------------+---------+-----------+----------+--------------+ PERO     Full                                                        +---------+---------------+---------+-----------+----------+--------------+   +---------+---------------+---------+-----------+----------+--------------+ LEFT     CompressibilityPhasicitySpontaneityPropertiesThrombus Aging +---------+---------------+---------+-----------+----------+--------------+ CFV      Full           Yes      Yes                                 +---------+---------------+---------+-----------+----------+--------------+ SFJ      Full                                                        +---------+---------------+---------+-----------+----------+--------------+ FV Prox  Full                                                        +---------+---------------+---------+-----------+----------+--------------+ FV Mid   Full                                                         +---------+---------------+---------+-----------+----------+--------------+ FV DistalFull                                                        +---------+---------------+---------+-----------+----------+--------------+ PFV      Full                                                        +---------+---------------+---------+-----------+----------+--------------+ POP      Full           Yes      Yes                                 +---------+---------------+---------+-----------+----------+--------------+  PTV      Full                                                        +---------+---------------+---------+-----------+----------+--------------+ PERO     Full                                                        +---------+---------------+---------+-----------+----------+--------------+     Summary: BILATERAL: - No evidence of deep vein thrombosis seen in the lower extremities, bilaterally. -No evidence of popliteal cyst, bilaterally.   *See table(s) above for measurements and observations. Electronically signed by Heath Lark on 01/08/2022 at 12:20:22 PM.    Final    DG Cervical Spine 2 or 3 views  Result Date: 12/30/2021 CLINICAL DATA:  Cervical revision EXAM: CERVICAL SPINE - 2-3 VIEW COMPARISON:  CT 10/29/2021 FINDINGS: Anterior fusion from C7 through T1 with corpectomy of C6. Posterior fusion from C3 to the lower cervical/upper thoracic region. Detail is limited on the lateral view. IMPRESSION: Anterior and fusion procedures. Lower detail is limited in the lateral projection. Electronically Signed   By: Paulina Fusi M.D.   On: 12/30/2021 16:00   DG C-Arm 1-60 Min-No Report  Result Date: 12/30/2021 Fluoroscopy was utilized by the requesting physician.  No radiographic interpretation.   CT CERVICAL SPINE WO CONTRAST  Result Date: 12/30/2021 CLINICAL DATA:  Cervical radiculopathy after surgery EXAM: CT CERVICAL SPINE WITHOUT CONTRAST TECHNIQUE: Multidetector CT  imaging of the cervical spine was performed without intravenous contrast. Multiplanar CT image reconstructions were also generated. RADIATION DOSE REDUCTION: This exam was performed according to the departmental dose-optimization program which includes automated exposure control, adjustment of the mA and/or kV according to patient size and/or use of iterative reconstruction technique. COMPARISON:  10/29/2021 and preceding cervical MRI. FINDINGS: Alignment: Improved alignment after reduction of 3 column fractures seen in April. New baseline Skull base and vertebrae: Posterior-lateral fusion hardware spanning C3-T1. The hardware is intact. Revision of ACDF hardware which spans C4 to C7-T1 with corpectomy cage at C6. Preexistent solid arthrodesis at C3-4, C4-5, and C6-7. There has been decompression of the fractured bilateral posterior elements at C6-7. no hardware fracture or transcanal screw placement. The left T1 pedicle screw partially traverses the upper T1-2 foramen. The left C5 lateral mass screw is in close proximity to the transverse foramen, but lateral to it and there is preserved flow void by MRI. Soft tissues and spinal canal: Expected soft tissue gas and swelling in the posterior soft tissues. No evidence of canal hematoma by prior MRI. Disc levels:  No bony canal or foraminal impingement. Upper chest: Clear apical lungs. IMPRESSION: Cervical spine fracture reduction and fixation with corpectomy at the C5-6 level. No hardware fracture or displacement. The left T1 pedicle screw partially traverses the left T1-2 foramen; no cervical foraminal encroachment to explain cervical radiculopathy. Electronically Signed   By: Tiburcio Pea M.D.   On: 12/30/2021 09:55   MR CERVICAL SPINE W WO CONTRAST  Result Date: 12/30/2021 CLINICAL DATA:  Arm weakness, left and worsening EXAM: MRI CERVICAL SPINE WITHOUT AND WITH CONTRAST TECHNIQUE: Multiplanar and multiecho pulse sequences of the cervical spine, to include  the craniocervical junction and cervicothoracic junction, were obtained without and with intravenous contrast. CONTRAST:  6mL GADAVIST GADOBUTROL 1 MMOL/ML IV SOLN COMPARISON:  10/29/2021 FINDINGS: Alignment: Improved alignment after reduction of displaced cervical spine fracture seen previously. No acute malalignment. Vertebrae: Interval posterior fusion spanning C3-T1. Pre-existing C3-C7 ACDF with solid arthrodesis but complicating fracture. No evidence of acute fracture. Cord: Normal cord signal.  No residual spinal canal hematoma seen. Posterior Fossa, vertebral arteries, paraspinal tissues: Posterior exposure fluid, expected. Redemonstrated C5-6 ligamentum flavum cleft. Mild prevertebral fluid is well. Postoperative fluid do not cause significant mass effect. Disc levels: There is mild posterior cord indentation due to ridging at C5-6. Diffusely patent foramina. IMPRESSION: 1. Interval reduction and fusion of cervical spine fracture seen last month. No unexpected postoperative features. Resolved epidural hematoma. 2. 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 Diffusely patent appearance of the foramina. Electronically Signed   By: Tiburcio Pea M.D.   On: 12/30/2021 06:15   DG Cervical Spine 2 or 3 views  Result Date: 12/29/2021 CLINICAL DATA:  Anterior cervical decompression EXAM: CERVICAL SPINE - 2-3 VIEW COMPARISON:  10/29/2021 FLUOROSCOPY TIME:  Radiation Exposure Index (as provided by the fluoroscopic device): 5.44 mGy If the device does not provide the exposure index: Fluoroscopy Time:  58 seconds Number of Acquired Images:  4 FINDINGS: Initial images again demonstrate anterior fixation from C3-C5 with significant increased kyphosis at C5-6 similar to that noted on prior CT examination. This hardware was subsequently removed as well as C5-6 corpectomy with placement of interbody prosthesis at C6. Improvement of the previously seen kyphosis is noted. IMPRESSION: C5-6 corpectomy with  placement interbody prosthesis. Electronically Signed   By: Alcide Clever M.D.   On: 12/29/2021 19:33   DG Cervical Spine 1 View  Result Date: 12/29/2021 CLINICAL DATA:  Anterior cervical decompression and fusion. EXAM: DG CERVICAL SPINE - 1 VIEW COMPARISON:  CT cervical spine 10/29/2021. FINDINGS: Posterior and anterior fusion hardware seen from the level of C3 extending inferiorly past the level of C5. Hardware is incompletely imaged. Patient is intubated. Enteric tube is present. Alignment is grossly anatomic. IMPRESSION: 1. Posterior and anterior fusion hardware is present from the level of C3 extending at least to the level of C5. Alignment is grossly anatomic. Electronically Signed   By: Darliss Cheney M.D.   On: 12/29/2021 19:27   DG C-Arm 1-60 Min-No Report  Result Date: 12/29/2021 Fluoroscopy was utilized by the requesting physician.  No radiographic interpretation.   DG C-Arm 1-60 Min-No Report  Result Date: 12/29/2021 Fluoroscopy was utilized by the requesting physician.  No radiographic interpretation.   DG C-Arm 1-60 Min-No Report  Result Date: 12/29/2021 Fluoroscopy was utilized by the requesting physician.  No radiographic interpretation.   DG C-Arm 1-60 Min-No Report  Result Date: 12/29/2021 Fluoroscopy was utilized by the requesting physician.  No radiographic interpretation.   DG C-Arm 1-60 Min-No Report  Result Date: 12/29/2021 Fluoroscopy was utilized by the requesting physician.  No radiographic interpretation.   DG C-Arm 1-60 Min-No Report  Result Date: 12/29/2021 Fluoroscopy was utilized by the requesting physician.  No radiographic interpretation.   DG C-Arm 1-60 Min-No Report  Result Date: 12/29/2021 Fluoroscopy was utilized by the requesting physician.  No radiographic interpretation.    Labs:  Basic Metabolic Panel: Recent Labs  Lab 01/10/22 0615 01/13/22 0546  NA 134* 134*  K 4.2 4.3  CL 100 100  CO2 27 27  GLUCOSE 102* 97  BUN 7* <5*  CREATININE  0.49  0.46  CALCIUM 8.8* 8.7*    CBC: Recent Labs  Lab 01/10/22 0615 01/13/22 0546  WBC 7.8 6.7  NEUTROABS 3.9 2.9  HGB 8.8* 8.6*  HCT 26.3* 26.9*  MCV 87.1 88.2  PLT 361 336    CBG: No results for input(s): "GLUCAP" in the last 168 hours.  Family history.  Father with CAD as well as esophageal cancer or stomach cancer.  Negative for stroke colon cancer or rectal cancer  Brief HPI:   Rachel Duran is a 66 y.o. right-handed female with history of chronic anemia bipolar disorder depression fibromyalgia, ACDF as well as left total hip arthroplasty 2020, scleroderma.  Per chart review lives with spouse.  Independent prior to admission.  Presented 12/29/2021 with noted history of fall 10/29/2021 suffering a C5-6 fracture subluxation/cervical kyphosis with persistent neck pain and initial conservative care with failed medical management.  Underwent 3 stage anterior posterior fusion C5-6 corpectomy posterior fusion C3-7, ACDF C3-7 12/29/2021.  Postoperatively developed left hand weakness consistent with a C8 radiculopathy as well as small amount of epidural fluid at C5-6.  Cervical CT demonstrated the left T1 pedicle screw was a bit low and into the neural foramina.  Patient underwent revision of posterior cervical instrumentation 12/30/2021 per Dr. Tressie Stalker.  Placed in a cervical collar.  Hospital course pain management as well as acute blood loss anemia 9.8 and monitored.  Tolerating a regular diet.  Therapy evaluations completed due to patient decreased functional mobility was admitted for a comprehensive rehab program.   Hospital Course: Rachel Duran was admitted to rehab 01/07/2022 for inpatient therapies to consist of PT, ST and OT at least three hours five days a week. Past admission physiatrist, therapy team and rehab RN have worked together to provide customized collaborative inpatient rehab.  Pertaining to patient's C5-6 fracture status post 3 stage anterior posterior fusion with  corpectomy posterior fusion C3-7 ACDF 12/29/2021 that was complicated by postoperative left hand weakness consistent with a C8 radiculopathy status post revision posterior cervical instrumentation 12/30/2021 for missed placement of thoracic pedicle screw.  Cervical collar when out of bed.  Follow-up per neurosurgery.  Venous Doppler studies negative she had been placed on Lovenox for DVT prophylaxis.  Pain managed with use of Lidoderm patch as well as Lyrica MS IR as needed.  She did undergo trigger point injection 01/13/2022 for some myofascial pain.  Mood stabilization with Xanax as well as escopiclone 3 mg nightly with Lamictal 200 mg daily and Abilify.  Emotional support provided.  She was attending full therapies.  Bouts of constipation resolved with laxative assistance including Linzess as well as Movantik.  Blood pressure controlled on Norvasc and would need outpatient follow-up.  Chronic anemia latest hemoglobin 8.8 asymptomatic.  History of interstitial cystitis maintained on Elmiron as directed as well as history of cholecystitis gallstones maintained on Actigall 300 mg twice daily.   Blood pressures were monitored on TID basis and soft and monitored    Rehab course: During patient's stay in rehab weekly team conferences were held to monitor patient's progress, set goals and discuss barriers to discharge. At admission, patient required min to mod assist 100 feet without assistive device minimal assist step pivot transfers  Physical exam.  Blood pressure 116/75 pulse 105 temperature 99 respirations 18 oxygen saturations 99% room air Constitutional.  No acute distress HEENT Head.  Normocephalic and atraumatic Eyes.  Pupils round and reactive to light no discharge without nystagmus Neck.  Cervical collar in place  anterior neck incision with Steri-Strips Cardiac regular rate and rhythm without any extra sounds or murmur heard Abdomen.  Soft nontender positive bowel sounds without  rebound Respiratory effort normal no respiratory distress without wheeze Neurologic.  Alert oriented x3 5/5 strength throughout with the exception of left hand grip 4/5  He/She  has had improvement in activity tolerance, balance, postural control as well as ability to compensate for deficits. He/She has had improvement in functional use RUE/LUE  and RLE/LLE as well as improvement in awareness.  Patient ambulating extended distances with assistive device.  Weaving around cones with 180 degree turns with minimal assist.  Working with high-level balance activities with ADLs.  She can gather her belongings for activities day living and homemaking.  Patient able to complete bilateral clasped arm table slides forward and side to side to general activate upper body mm while maintaining neck and trunk and spinal alignment.  Full family teaching completed plan discharged to home       Disposition: Discharge to home    Diet: Regular  Special Instructions: No driving smoking or alcohol  Cervical collar as directed  Medications at discharge. 1.  Tylenol as needed 2.  Xanax 0.5 mg p.o. nightly 3.  Norvasc 7.5 mg p.o. daily 4.  Adderall 30 mg p.o. daily 5.  Abilify 5 mg p.o. nightly 6.  Flexeril 10 mg p.o. 3 times daily as needed 7.  Colace 100 mg p.o. twice daily 8.Eszopiclone 3 mg nightly 9.  Ferrous sulfate 325 mg p.o. Monday Wednesday Friday 10.  Lamictal 200 mg p.o. daily 11.  Lidoderm patch as directed 12.  Linzess 290 mcg p.o. daily 13.  MS IR 15 mg every 4 hours as needed moderate pain 14.  Movantik 12.5 mg p.o. daily 15.  Protonix 40 mg p.o. twice daily 16.  Elmiron 200 mg p.o. twice daily 17.  MiraLAX twice daily hold for loose stools 18.  Lyrica 150 mg p.o. twice daily 19.  Metamucil 1 tablet daily 20.  Actigall 300 mg p.o. twice daily 21.aspirin 81 mg daily   30-35 minutes were spent completing discharge summary and discharge planning  Discharge Instructions      Ambulatory referral to Physical Medicine Rehab   Complete by: As directed    Moderate complexity follow-up 1 to 2 weeks radiculopathy        Follow-up Information     Lovorn, Aundra Millet, MD Follow up.   Specialty: Physical Medicine and Rehabilitation Why: Office to call for appointment Contact information: [address] [place] [address] [phone]         Tressie Stalker, MD Follow up.   Specialty: Neurosurgery Why: Call for appointment Contact information: [address] [place] [address] [phone]         Eustaquio Boyden, MD Follow up.   Specialty: Family Medicine Contact information: [address] [place] [address] [phone]                 Signed: Charlton Amor 01/14/2022, 6:05 AM

## 2022-01-15 NOTE — Progress Notes (Signed)
PROGRESS NOTE   Subjective/Complaints:  Patient's issues with constipation have resolved with BM this morning.  Husband is in the room and she is ready to go home.  AVS given and was reviewed by weekday PA.  She is just awaiting her home medication return from pharmacy by bedside nursing prior to discharge.  ROS: Pt denies SOB, abd pain, CP, N/V/C/D, or visual changes   Objective:   No results found.  Recent Labs    01/13/22 0546  WBC 6.7  HGB 8.6*  HCT 26.9*  PLT 336   Recent Labs    01/13/22 0546  NA 134*  K 4.3  CL 100  CO2 27  GLUCOSE 97  BUN <5*  CREATININE 0.46  CALCIUM 8.7*    Intake/Output Summary (Last 24 hours) at 01/15/2022 0900 Last data filed at 01/14/2022 1900 Gross per 24 hour  Intake 240 ml  Output --  Net 240 ml        Physical Exam: Vital Signs Blood pressure 109/73, pulse 88, temperature 97.8 F (36.6 C), resp. rate 16, height 5' 2.99" (1.6 m), weight 62.5 kg, last menstrual period 07/25/1998, SpO2 96 %. Physical Exam:       General: Awake alert appropriate sitting in chair awaiting for discharge today not in acute distress HEENT: Conjugate gaze oropharynx moist cervical collar neck pillow around neck CV: Regular rate and rhythm no JVD Pulmonary: Lungs CTA bilaterally no W/R/R with good air movement GI: Soft nontender nondistended positive bowel sounds all 4 quadrants had bowel movement today Neurological: alert and oriented x3.  Motor strength 5/5 throughout with exception of the left hand grip 4/5 Psychiatric appropriate excited to go home today MSK: Trigger points are still in rhomboids, cervical paraspinals, spinous Capitis and liver inhalers/upper traps scalenes Skin: anterior neck incision with Steri-Strips looks well today   Assessment/Plan: 1. Functional deficits which require 3+ hours per day of interdisciplinary therapy in a comprehensive inpatient rehab  setting. Physiatrist is providing close team supervision and 24 hour management of active medical problems listed below. Physiatrist and rehab team continue to assess barriers to discharge/monitor patient progress toward functional and medical goals  Care Tool:  Bathing  Bathing activity did not occur: Refused (pt already dressed) Body parts bathed by patient: Right arm, Left arm, Chest, Abdomen, Right upper leg, Left upper leg, Face, Front perineal area, Buttocks, Right lower leg, Left lower leg   Body parts bathed by helper: Right lower leg, Left lower leg     Bathing assist Assist Level: Supervision/Verbal cueing     Upper Body Dressing/Undressing Upper body dressing   What is the patient wearing?: Bra, Pull over shirt Orthosis activity level: Performed by helper  Upper body assist Assist Level: Supervision/Verbal cueing    Lower Body Dressing/Undressing Lower body dressing      What is the patient wearing?: Underwear/pull up, Pants     Lower body assist Assist for lower body dressing: Supervision/Verbal cueing     Toileting Toileting Toileting Activity did not occur (Clothing management and hygiene only): N/A (no void or bm)  Toileting assist Assist for toileting: Independent     Transfers Chair/bed transfer  Transfers assist  Chair/bed transfer assist level: Supervision/Verbal cueing     Locomotion Ambulation   Ambulation assist      Assist level: Moderate Assistance - Patient 50 - 74% Assistive device: No Device Max distance: 150'   Walk 10 feet activity   Assist     Assist level: Contact Guard/Touching assist Assistive device: No Device   Walk 50 feet activity   Assist    Assist level: Moderate Assistance - Patient - 50 - 74% Assistive device: No Device    Walk 150 feet activity   Assist    Assist level: Moderate Assistance - Patient - 50 - 74% Assistive device: No Device    Walk 10 feet on uneven surface   activity   Assist     Assist level: Minimal Assistance - Patient > 75%     Wheelchair     Assist Is the patient using a wheelchair?: No             Wheelchair 50 feet with 2 turns activity    Assist            Wheelchair 150 feet activity     Assist          Blood pressure 109/73, pulse 88, temperature 97.8 F (36.6 C), resp. rate 16, height 5' 2.99" (1.6 m), weight 62.5 kg, last menstrual period 07/25/1998, SpO2 96 %.  Medical Problem List and Plan: 1. Functional deficits secondary to C5-6 fracture status post 3 staged anterior posterior fusion C5-6 corpectomy, posterior fusion C3-7, ACDF C3-7 12/29/2021 with postoperative left hand weakness consistent with C8 radiculopathy status post revision posterior cervical instrumentation 12/30/2021 for misplacement of thoracic pedicle screw.  Cervical collar when out of bed.             -patient may shower             -ELOS/Goals: 7 days            con't CIR- PT and OT- family education today D/c date 6/27 2.  Antithrombotics: -DVT/anticoagulation:  Mechanical: Antiembolism stockings, thigh (TED hose) Bilateral lower extremities.  Check vascular study             -antiplatelet therapy: N/A 6/17 b/l LE ultrasound with no evidence of DVT  6/19- will start Lovenox 40 mg daily- has been Greater than 7 days.   6/20- d/w pt need for Lovenox since not walking as far as prior.   6/22- will not send home on lovenox             6/24 Meds at d/c reviewed by weekday PA. 3. Pain Management: Lidoderm patch, Lyrica 150 mg twice daily, Flexeril as needed, MS IR 15 mg every 4 hours as needed  6/19- still having myofascial pain- will do trigger point injections  6/20- increased lidocaine patches to 3 patches- wait on TrP injections.   6/21- will change flexeril to scheduled 10 mg TID- since doesn't make sleepy  6/22- trigger point injections today  6/23- pain much better 2/10 with trP injections- con't pain regimen  4.  Mood/Sleep/bipolar disorder: Xanax 0.5 mg nightly,Escopiclone 3 mg nightly, Lamictal 200 mg daily,             -antipsychotic agents: Abilify 5 mg nightly 5. Neuropsych/cognition: This patient is capable of making decisions on her own behalf. 6. Skin/Wound Care: Routine skin checks 7. Fluids/Electrolytes/Nutrition: Routine in and outs with follow-up chemistries 8.  Constipation.  MiraLAX twice daily, Movantik 12.5 mg daily, Linzess 290 mcg daily,  Colace 100 mg twice daily  6/19- change Movantik and Linzess to 6am and Miralax Am dose to 6am- per pt home schedule. Will have therapy not start til 8:30am  6/22- no BM yesterday- will add metamucil daily at 6am  6/23- will give Sorbitol after therapy today- hopefully will go then- if not, needs KUB and probably an enema.  9.  Hypertension.  Norvasc 7.5 mg daily.  Monitor with increased mobility  6/23- BP controlled- con't regimen 6/24 BP good for discharge    01/15/2022    5:58 AM 01/14/2022    7:29 PM 01/14/2022    2:08 PM  Vitals with BMI  Systolic 109 114 161  Diastolic 73 67 84  Pulse 88 96 103    10.  Chronic anemia.  Continue iron supplement.  Follow-up CBC, last HGB 9.8 on 6/8  6/20- Hb 8.8- will monitor since has dropped some.  Hemoglobin & Hematocrit  6/24 H/H stable for discharge    Component Value Date/Time   HGB 8.6 (L) 01/13/2022 0546   HGB 12.3 02/04/2014 0000   HCT 26.9 (L) 01/13/2022 0546    11.  Interstitial cystitis: continue Elmiron 200 mg twice daily 12.  GERD.  Continue Protonix 13.  Cholecystitis gallstones.  Continue Actigall 300 mg twice daily 14. Insomnia  -Cont home lunesta 15. Leukocytosis on prior labs 12/30/21  -6/19- WBC down to 7.8k- con't to monitor 16. Myofascial pain- will do Trigger point injections tomorrow if hasn't improved.   6/20- will wait on trigger point injections, but increase lidoderm patches to 3 patches.   6/21- Lidoderm was helpful during day, but pain worse at night- wants trigger point  injections tomorrow  6/22- doing trigger point injections today   6/23- great results- feeling much better 6/24 Lidoderm working well.   45 min spent today on discharge of patient including pt discharge instructions, physical assessment, and review of discharge summary with pt and spouse.   LOS: 8 days A FACE TO FACE EVALUATION WAS PERFORMED  Tressia Miners 01/15/2022, 9:00 AM

## 2022-01-18 ENCOUNTER — Other Ambulatory Visit: Payer: Self-pay | Admitting: Family Medicine

## 2022-01-18 ENCOUNTER — Ambulatory Visit: Payer: Medicare Other

## 2022-01-19 NOTE — Telephone Encounter (Signed)
ERx 

## 2022-01-20 ENCOUNTER — Ambulatory Visit: Payer: Medicare Other | Attending: Physical Medicine and Rehabilitation | Admitting: Physical Therapy

## 2022-01-20 ENCOUNTER — Encounter: Payer: Self-pay | Admitting: Physical Therapy

## 2022-01-20 ENCOUNTER — Ambulatory Visit: Payer: Medicare Other | Admitting: Occupational Therapy

## 2022-01-20 VITALS — BP 135/89 | HR 100

## 2022-01-20 DIAGNOSIS — M6281 Muscle weakness (generalized): Secondary | ICD-10-CM | POA: Diagnosis present

## 2022-01-20 DIAGNOSIS — M5410 Radiculopathy, site unspecified: Secondary | ICD-10-CM | POA: Insufficient documentation

## 2022-01-20 DIAGNOSIS — R2689 Other abnormalities of gait and mobility: Secondary | ICD-10-CM | POA: Diagnosis present

## 2022-01-20 NOTE — Therapy (Addendum)
OUTPATIENT PHYSICAL THERAPY NEURO EVALUATION   Patient Name: Rachel Duran MRN: [id number] DOB:06-14-1956, 65 y.o., female Today's Date: 01/21/2022   PCP: Ria Bush, MD REFERRING PROVIDER: Courtney Heys, MD    PT End of Session - 01/20/22 [phone]     Visit Number 1    Number of Visits 19   18 + eval   Date for PT Re-Evaluation 04/29/22   pushed out for scheduling   Authorization Type [employer]ICARE    Progress Note Due on Visit 10    PT Start Time 0847    PT Stop Time 0935    PT Time Calculation (min) 48 min    Equipment Utilized During Treatment Gait belt;Cervical collar    Activity Tolerance Patient tolerated treatment well    Behavior During Therapy WFL for tasks assessed/performed             Past Medical History:  Diagnosis Date   Abdominal pain last 4 months   and nausea also   Allergy    Anemia    history of   Anxiety    Bipolar disorder ([place])    atpical bipolar disorder   Cervical disc disease limited rom turning to left   hx. C6- C7 -hx. past fusion(bone graft used)   Cholecystitis    Chronic pain    DDD (degenerative disc disease), lumbar 09/2015   dextroscoliosis with multilevel DDD and facet arthrosis most notable for R foraminal disc protrusion L4/5 producing severe R neural foraminal stenosis abutting R L4 nerve root, moderate spinal canal and mild lat recesss and R neural foraminal stenosis L3/4 (MRI)   Depression    bipolar depression   Disorders of porphyrin metabolism    Felon of finger of left hand 11/10/2016   Fibromyalgia    GERD (gastroesophageal reflux disease)    Headache    occasionally   Internal hemorrhoids    Interstitial cystitis 06/06/2012   hx.   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 (Dr Baird Cancer retinologist)   Tuberculosis    Past Surgical History:  Procedure Laterality Date   ANTERIOR CERVICAL DECOMP/DISCECTOMY FUSION  2004   C5/6, C6/7   ANTERIOR CERVICAL DECOMP/DISCECTOMY FUSION  02/2016   C3/4, C4/5 with plating [name])   ANTERIOR CERVICAL DECOMP/DISCECTOMY FUSION  12/29/2021   Procedure: ANTERIOR CERVICAL DECOMPRESSION/ DISCECTOMY FUSION CERVICAL THREE - SEVEN;  Surgeon: Newman Pies, MD;  Location: [place];  Service: Neurosurgery;;   ANTERIOR CERVICAL DECOMPRESSION/DISCECTOMY FUSION 4 LEVELS N/A 12/29/2021   Procedure: CERVICAL FIVE-SIX CORPECTOMY,;  Surgeon: Newman Pies, MD;  Location: [place];  Service: Neurosurgery;  Laterality: N/A;   AUGMENTATION MAMMAPLASTY Bilateral 03/25/2010   BREAST ENHANCEMENT SURGERY  2010   BREAST IMPLANT EXCHANGE  10/2014   exchange saline implants, B mastopexy/capsulorraphy ([facility])   BUNIONECTOMY Bilateral yrs ago   [place]   x 1   CHOLECYSTECTOMY  06/11/2012   Procedure: LAPAROSCOPIC CHOLECYSTECTOMY WITH INTRAOPERATIVE CHOLANGIOGRAM;  Surgeon: [name],  MD,FACS;  Location: [facility];  Service: General;  Laterality: N/A;   COLONOSCOPY  02/2018   done for positive cologuard - WNL, rpt 10 yrs ([place])   CYSTOSCOPY     ERCP  05/22/2012   Procedure: ENDOSCOPIC RETROGRADE CHOLANGIOPANCREATOGRAPHY (ERCP);  Surgeon: [name], MD,FACG;  Location: [facility];  Service: Endoscopy;  Laterality: N/A;   ERCP N/A 09/17/2013   Procedure: ENDOSCOPIC RETROGRADE CHOLANGIOPANCREATOGRAPHY (ERCP);  Surgeon: [name], MD;  Location: [facility];  Service: Endoscopy;  Laterality: N/A;   ERCP N/A 09/27/2018   Procedure: ENDOSCOPIC RETROGRADE CHOLANGIOPANCREATOGRAPHY (ERCP);   Surgeon: [name], MD;  Location: [facility];  Service: Endoscopy;  Laterality: N/A;   ESOPHAGOGASTRODUODENOSCOPY  09/2016   WNL. esophagus dilated Fuller Plan)   HEMORRHOID BANDING  09/23/2013   --Dr. Greer Pickerel   HERNIA REPAIR     inguinal   HYSTEROSCOPY W/ ENDOMETRIAL ABLATION     KNEE ARTHROSCOPY Right 04/06/2021   Procedure: RIGHT KNEE ARTHROSCOPY WITH DEBRIDEMENT;  Surgeon: [name], MD;  Location: [place];  Service: Orthopedics;  Laterality: Right;   NASAL SINUS SURGERY     x5   PARTIAL KNEE ARTHROPLASTY Right 06/01/2020   Procedure: Right knee patellofemoral replacement;  Surgeon: [name], MD;  Location: [place];  Service: Orthopedics;  Laterality: Right;   POSTERIOR CERVICAL FUSION/FORAMINOTOMY N/A 12/29/2021   Procedure: POSTERIOR CERVICAL FUSION CERVICAL THREE-SEVEN;  Surgeon: Newman Pies, MD;  Location: [place];  Service: Neurosurgery;  Laterality: N/A;   REMOVAL OF STONES  09/27/2018   Procedure: REMOVAL OF STONES;  Surgeon: [name], MD;  Location: [facility];  Service: Endoscopy;;   SPHINCTEROTOMY  09/27/2018   Procedure: SPHINCTEROTOMY;  Surgeon: [name], MD;  Location[facility];  Service: Endoscopy;;   TONSILLECTOMY     removed as a child   TOTAL HIP ARTHROPLASTY Left 03/12/2019   Procedure: LEFT TOTAL HIP ARTHROPLASTY ANTERIOR APPROACH;  [name], [name], MD)   UPPER GASTROINTESTINAL ENDOSCOPY     Patient Active Problem List   Diagnosis Date Noted   Fusion of spine, cervical region    Anemia    Primary hypertension    Radiculopathy 01/07/2022   Cervical radiculopathy 12/29/2021   C6 cervical fracture ([place]) 12/29/2021   C5 cervical fracture ([place]) 12/21/2021   Advanced directives, counseling/discussion 09/23/2021   Strep throat 08/18/2021   Swelling of right index finger 07/14/2021   Synovitis of right knee    Scoliosis 01/13/2021   Osteopenia 07/02/2020   Status  post left knee surgery 06/01/2020   Pulmonary nodule 05/27/2020   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   Chronic patellofemoral pain of right knee 05/05/2020   Positive ANA (antinuclear antibody) 06/05/2019   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   Unilateral primary osteoarthritis, left hip 01/28/2019   Pelvic pain 12/12/2018   Common bile duct (CBD) obstruction    Venous insufficiency of left lower extremity 07/04/2018   Dyslipidemia 09/22/2017   DDD (degenerative disc disease), cervical 04/03/2017   DDD (degenerative disc disease), lumbar 04/03/2017   Onychomycosis 03/21/2017   Dysphagia 10/18/2016   Encounter for chronic pain management 10/18/2016   Biliary stasis 09/26/2016   HNP (herniated nucleus pulposus), lumbar 09/23/2015   Iron deficiency 07/30/2015   Health maintenance examination 06/15/2015   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   Clavicle enlargement 12/13/2014   Prediabetes 12/13/2014   Plantar fasciitis, bilateral 09/11/2014   Medicare annual wellness visit, subsequent 06/10/2014  Abnormal thyroid function test 06/10/2014   Chronic pain syndrome 06/10/2014   CFS (chronic fatigue syndrome) 04/08/2014   Postmenopausal atrophic vaginitis 10/19/2012   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   Cervical disc disorder with radiculopathy of cervical region 05/28/2010   APHTHOUS ULCERS 01/31/2008   Insomnia 11/09/2007   Constipation 10/11/2007   Allergic rhinitis 04/12/2007   MDD (major depressive disorder), recurrent episode, moderate ([place]) 03/05/2007   [name] syndrome 03/05/2007   GERD 03/05/2007   ROSACEA 03/05/2007   NEURALGIA 03/05/2007   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[place]) 12/06/2006   Chronic interstitial cystitis 12/06/2006   Fibromyalgia 12/06/2006    ONSET DATE: 01/12/2022 (referral)  REFERRING DIAG: M54.10 (ICD-10-CM) - Radiculopathy, site unspecified M43.22 (ICD-10-CM) - Fusion of spine, cervical region   THERAPY DIAG:   Radiculopathy, unspecified spinal region  Other abnormalities of gait and mobility  Muscle weakness (generalized)  Rationale for Evaluation and Treatment Rehabilitation  SUBJECTIVE:                                                                                                                                                                                              SUBJECTIVE STATEMENT: "Before surgery both arms were numb, but now it's mostly my left arm.  I still can't write well with the right arm and I'm right handed.  I had a lot of trouble walking in the hospital.  I wasn't able to walk unassisted because I was really wobbly, but I'm doing better with my walker now.  I feel a little more steady.  I have not done anything without the walker, but can tell my balance is off." Pt accompanied by: significant other-David  PERTINENT HISTORY: Chronic pain, [name] syndrome, GERD, cervical radiculopathy, fibromyalgia, hereditary macular degeneration (she has most trouble with peripheral vision), herniated lumbar disc L4/L5, DDD, Partial right knee arthroplasty, scoliosis From rehab admit note:  "ACDF 2004 C5-6 and C6-7 and 2017 C3-4 and C4-5, left total hip arthroplasty 2020, scleroderma.  Presented 12/29/2021 with noted history of fall 10/29/2021 suffering a C5-6 fracture subluxation/cervical kyphosis with persistent neck pain and initial conservative care with failed medical management.  Underwent 3 stage anterior posterior fusion C5-6 corpectomy, posterior fusion C3-7, ACDF C3-7 12/29/2021.  Postop she developed left hand weakness consistent with a C8 radiculopathy as well as small amount of epidural fluid at C5-6.  Cervical CT demonstrated that the left T1 pedicle screw was a bit low and into the neural foramen.  Patient underwent revision of posterior cervical instrumentation 12/30/2021 per Dr. Newman Pies.  Placed in a cervical hard collar when out of bed."   PAIN:  Are you having pain? Yes:  NPRS scale: 3/10 Pain location: base of skull to T1/T2 area and radiating to the left periscapular region into the arm. Pain description: ache Aggravating factors: Doing OT exercises, comes on late afternoon and is very tender Relieving factors: voltaren gel, lidocaine patches, muscle relaxer  PRECAUTIONS: Cervical and Fall-cervical hard collar when OOB  WEIGHT BEARING RESTRICTIONS No  FALLS: Has patient fallen in last 6 months? Yes. Number of falls April 7-when she passed out and fell backwards and fractured her neck on the bathroom tile.  LIVING ENVIRONMENT: Lives with: lives with their spouse Lives in: House/apartment Stairs: Yes: Internal: 14 steps; on left going up; garage entrance has 1 step up, but front is level entry and she uses both. Has following equipment at home: Single point cane, Walker - 2 wheeled, shower chair, Shower bench, and Grab bars  PLOF: Requires assistive device for independence, Needs assistance with ADLs, Needs assistance with homemaking, and Needs assistance with transfers-needs help getting over lip into shower  PATIENT GOALS "I would like to learn to walk again independently, be able to drive, and go out and walk in the neighborhood again."  OBJECTIVE:   DIAGNOSTIC FINDINGS: CT from 12/30/2021:  Cervical spine fracture reduction and fixation with corpectomy at the C5-6 level. No hardware fracture or displacement. The left T1 pedicle screw partially traverses the left T1-2 foramen; no cervical foraminal encroachment to explain cervical radiculopathy.  COGNITION: Overall cognitive status: Within functional limits for tasks assessed   SENSATION: WFL-endorses no N/T in BLE; some in LUE  COORDINATION: Heel-to-shin:  WFL  EDEMA:  None noted in BLEs  MUSCLE TONE: None noted in BLEs  POSTURE: No Significant postural limitations  LOWER EXTREMITY ROM:     Active  Right Eval Left Eval  Hip flexion [facility]  Hip extension    Hip abduction " "  Hip  adduction " "  Hip internal rotation    Hip external rotation    Knee flexion " "  Knee extension " "  Ankle dorsiflexion " "  Ankle plantarflexion " "  Ankle inversion    Ankle eversion     (Blank rows = not tested)  LOWER EXTREMITY MMT:    MMT Right Eval Left Eval  Hip flexion 4/5 4/5  Hip extension    Hip abduction 4+/5 4+/5  Hip adduction 5/5 5/5  Hip internal rotation    Hip external rotation    Knee flexion 3+/5 3+/5  Knee extension 4/5 4/5  Ankle dorsiflexion 5/5 5/5  Ankle plantarflexion    Ankle inversion    Ankle eversion    (Blank rows = not tested)  BED MOBILITY:  Pt reports no issues.  TRANSFERS: Assistive device utilized: Environmental consultant - 2 wheeled  Sit to stand: Modified independence Stand to sit: Modified independence Chair to chair: SBA  STAIRS:  Level of Assistance: SBA  Stair Negotiation Technique: Alternating Pattern  with Single Rail on Left  Number of Stairs: 4   Height of Stairs: 6"  Comments: Pt ambulates down the stairs with inc hesitancy due to decreased visualization of LE placement.  GAIT: Gait pattern: step through pattern, decreased stride length, and trunk flexed Distance walked: various distances during assessment. Assistive device utilized: Environmental consultant - 2 wheeled Level of assistance: SBA Comments: Pt ambulates at decreased pace with mild forward flexion, but good safety awareness and approximation to RW.  FUNCTIONAL TESTs:  5 times sit to stand: 20.81sec w/ hands on knees Timed up  and go (TUG): 14.25sec w/ RW 10 meter walk test: 10.78sec w/ RW =0.93 m/sec OR 3.06 ft/sec  PATIENT SURVEYS:  N/A  TODAY'S TREATMENT:  N/A   PATIENT EDUCATION: Education details: PT POC, assessments used, goals set. Person educated: Patient and Spouse Education method: Explanation Education comprehension: verbalized understanding   HOME EXERCISE PROGRAM: To be established.    GOALS: Goals reviewed with patient? Yes  SHORT TERM GOALS: Target  date: 02/18/2022  Pt will be independent with strength and balance HEP with supervision from family as needed. Baseline:  To be established. Goal status: INITIAL  2.  Pt will decrease 5xSTS to </=17 seconds w/o UE support in order to demonstrate decreased risk for falls and improved functional bilateral LE strength and power. Baseline: 20.81sec w/ hands on knees Goal status: INITIAL  3.  Pt will demonstrate TUG of </=12 seconds in order to decrease risk of falls and improve functional mobility using LRAD. Baseline: 14.20 sec w/ RW Goal status: INITIAL  4.  BERG to be assessed with STG/LTG set as appropriate. Baseline: Not assessed on eval. Goal status: INITIAL  5.  6MWT to be assessed with STG/LTG set as appropriate. Baseline: Not assessed on eval. Goal status: INITIAL  6.  Pt will report no pain at rest and during static standing activities to promote improved activity tolerance. Baseline: 3/10 Goal status: INITIAL  LONG TERM GOALS: Target date: 03/18/2022 (8 weeks); 04/15/2022 (12 weeks)  Pt will report return to neighborhood walking 3 days per week to promote return to prior level of function. Baseline:  Pt not walking in neighborhood due to balance. Goal status: INITIAL  2.  Pt will decrease 5xSTS to </=13 seconds w/o UE support in order to demonstrate decreased risk for falls and improved functional bilateral LE strength and power. Baseline: 20.81sec w/ hands on knees Goal status: INITIAL  3.  BERG to be assessed with STG/LTG set as appropriate. Baseline: Not assessed on eval. Goal status: INITIAL  4.  6MWT to be assessed with STG/LTG set as appropriate. Baseline:  Not assessed on eval. Goal status: INITIAL  5.  Pt will ambulate >/=1000 feet on various indoor and outdoor surfaces with LRAD and modified independent level of assist to promote household and community access. Baseline: No distance formally calculated on eval. Goal status: INITIAL  6.  Pt will negotiate  14 stairs w/o UE support and reciprocal steps up and down independently to promote improved safety and independence within home environment. Baseline:  Pt demonstrates hesitancy with descending stairs. Goal status: INITIAL  ASSESSMENT:  CLINICAL IMPRESSION: Patient is a 66 y.o. female who was seen today for physical therapy evaluation and treatment for cervical radiculopathy s/p ACDF and revision of existing fusion.  Pt has a significant PMH of chronic pain, [name] syndrome, GERD, cervical radiculopathy, fibromyalgia, hereditary macular degeneration (she has most trouble with peripheral vision), herniated lumbar disc L4/L5, DDD, Partial right knee arthroplasty, scoliosis.  Identified impairments include impaired balance, decreased endurance, generalized LE weakness, and impaired UE sensation.  Evaluation via the following assessment tools: 10MWT, 5xSTS, and TUG as well as a history of fall leading to current status indicates fall risk.  6MWT and BERG to be assessed at next visit with patient likely to benefit from advanced assessment of FGA as she progresses.  She would benefit from skilled PT to address impairments as noted and progress towards long term goals.   OBJECTIVE IMPAIRMENTS Abnormal gait, decreased activity tolerance, decreased balance, decreased endurance, decreased mobility, difficulty walking,  decreased strength, impaired UE functional use, and pain.   ACTIVITY LIMITATIONS carrying, lifting, bending, squatting, and stairs  PARTICIPATION LIMITATIONS: driving, community activity, and yard work  PERSONAL FACTORS Age, Fitness, Past/current experiences, Time since onset of injury/illness/exacerbation, and 1-2 comorbidities: chronic pain and DDD  are also affecting patient's functional outcome.   REHAB POTENTIAL: Good  CLINICAL DECISION MAKING: Evolving/moderate complexity  EVALUATION COMPLEXITY: Moderate  PLAN: PT FREQUENCY:  2x/week for 6 weeks followed by 1x/week for 6  weeks  PT DURATION: 12 weeks (cert 14 weeks - 54/03/8263)  PLANNED INTERVENTIONS: Therapeutic exercises, Therapeutic activity, Neuromuscular re-education, Balance training, Gait training, Patient/Family education, Joint mobilization, Stair training, Vestibular training, DME instructions, Cryotherapy, Moist heat, Manual therapy, and Re-evaluation  PLAN FOR NEXT SESSION:  Assess BERG, 6MWT and update STG/LTG.  Initiate HEP for BLE strengthening and static corner balance.  Gait training w/ RW vs SPC vs no AD.  Descending stairs.  Pt will be appropriate for FGA in future.   Bary Richard, PT, DPT 01/21/2022, 9:23 AM

## 2022-01-21 ENCOUNTER — Other Ambulatory Visit: Payer: Self-pay | Admitting: Family Medicine

## 2022-01-26 ENCOUNTER — Encounter: Payer: Medicare Other | Attending: Registered Nurse | Admitting: Registered Nurse

## 2022-01-26 ENCOUNTER — Encounter: Payer: Self-pay | Admitting: Registered Nurse

## 2022-01-26 VITALS — BP 131/83 | HR 102 | Ht 62.0 in | Wt 131.0 lb

## 2022-01-26 DIAGNOSIS — M5412 Radiculopathy, cervical region: Secondary | ICD-10-CM | POA: Insufficient documentation

## 2022-01-26 DIAGNOSIS — M4322 Fusion of spine, cervical region: Secondary | ICD-10-CM | POA: Insufficient documentation

## 2022-01-26 NOTE — Progress Notes (Signed)
Subjective:    Patient ID: Rachel Duran, female    DOB: 1956/05/05, 66 y.o.   MRN: [id number]  HPI: [name] is a 66 y.o. female who is here for HFU appointment for follow up of her Cervical fusion spine and cervical radiculopathy.  She presented on 12/29/2021 for surgery, she had a fall on o4/01/2022 suffering C5-C6 fracture.  Dr [name] H&P: 12/29/2021 The patient is a 66 year old white female whose had a previous C5-6 and C6-7 anterior discectomy and interbody fusion in 2004.  She had a C3-4 and C4-5 anterior cervical discectomy fusion plating 2017.  The patient took a fall on 10/29/2021 suffering a C5-6 fracture.  Has had persistent neck pain.  She has failed medical management.  She was worked up with a cervical MRI and cervical CT.  We discussed the various treatment options.  She has decided proceed with surgery. Dr [name] Note: 12/29/2021 Brief history: The patient is a 66 year old white female whose had a previous C3-4 and C4-5 anterior cervical discectomy fusion and plating in 2017 and a previous C5-6 and C6-7 anterior cervical discectomy and fusion many years ago.  She fell suffering a C5-6 fracture subluxation and kyphosis.  She failed medical management.  She was worked up with a cervical CT and cervical MRI.  I discussed the various treatment options with her.  She has decided proceed with surgery.   Preoperative diagnosis: C5-6 fracture subluxation, cervical kyphosis, cervicalgia   Postoperative diagnosis: The same   Procedure: 3 stage procedure: C6 corpectomy; open reduction internal fixation C5-6 fracture subluxation; C5-6 and C6-7 interbody arthrodesis with local morcellized autograft bone and Zimmer DBM; insertion of interbody prosthesis at the C6 corpectomy site ([employer]ker Capri expandable titanium interbody prosthesis); application of Gardner-Wells tongs; exploration of cervical fusion/removal of cervical hardware   Surgeon: Dr. Earle Gell  DG Cervical  Spine:  IMPRESSION: C5-6 corpectomy with placement interbody prosthesis.  Cervical Spine: MRI IMPRESSION: 1. Interval reduction and fusion of cervical spine fracture seen last month. No unexpected postoperative features. Resolved epidural hematoma. 2. 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 Diffusely patent appearance of the foramina.   CT Cervical Spine:    IMPRESSION: Cervical spine fracture reduction and fixation with corpectomy at the C5-6 level. No hardware fracture or displacement. The left T1 pedicle screw partially traverses the left T1-2 foramen; no cervical foraminal encroachment to explain cervical radiculopathy.  Ms Newbold had a revision of posterior cervical instrumentation on 12/30/2021 by Dr [name].  REVISION OF POSTERIOR CERVICAL INSTRUMENTATION  Ms. Garfield was admitted to inpatient rehabilitation on 01/07/2022 and discharged home on 01/15/2022. She is receiving outpatient [facility] at [facility]. She states she has pain in her neck radiating into her left shoulder and left hand with tingling. She rats her pain 3.  Also reports she has a good appetite.  Wearing her cervical collar.    Pain Inventory Average Pain 3 Pain Right Now 3 My pain is intermittent, dull, and tingling  In the last 24 hours, has pain interfered with the following? General activity 4 Relation with others 0 Enjoyment of life 3 What TIME of day is your pain at its worst? evening and night Sleep (in general) Fair  Pain is worse with: inactivity and some activites Pain improves with: rest and medication Relief from Meds: 5  use a walker how many minutes can you walk? 10 ability to climb steps?  no do you drive?  no  disabled: date disabled 2004 I need  assistance with the following:  dressing, bathing, meal prep, household duties, and shopping Do you have any goals in this area?  yes  weakness numbness tingling trouble walking  Any changes since last  visit?  no  Any changes since last visit?  no    Family History  Problem Relation Age of Onset   CAD Father 74       MI, nonsmoker   Esophageal cancer Father 26   Stomach cancer Father    Scleroderma Mother    Hypertension Mother    Esophageal cancer Paternal Grandfather    Stomach cancer Paternal Grandfather    Diabetes Maternal Grandmother    Arthritis Brother    Stroke Neg Hx    Colon cancer Neg Hx    Rectal cancer Neg Hx    Social History   Socioeconomic History   Marital status: Married    Spouse name: Not on file   Number of children: Not on file   Years of education: Not on file   Highest education level: Not on file  Occupational History   Occupation: retired    Fish farm manager: UNEMPLOYED  Tobacco Use   Smoking status: Never    Passive exposure: Never   Smokeless tobacco: Never  Vaping Use   Vaping Use: Never used  Substance and Sexual Activity   Alcohol use: No    Alcohol/week: 0.0 standard drinks of alcohol   Drug use: No   Sexual activity: Yes    Partners: Male    Birth control/protection: Post-menopausal    Comment: vasectomy  Other Topics Concern   Not on file  Social History Narrative   Lives with husband and dog   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   Activity: tries to walk dog (45 min 3x/wk), yoga   Diet: good water, fruits/vegetables daily      Rheum: [name]   Psychiatrist: Kaur   Surgery: Redmond Pulling   [facility]   Urology: Amalia Hailey   Social Determinants of Health   Financial Resource Strain: Low Risk  (07/02/2019)   Overall Financial Resource Strain (CARDIA)    Difficulty of Paying Living Expenses: Not hard at all  Food Insecurity: No Food Insecurity (07/02/2019)   Hunger Vital Sign    Worried About Running Out of Food in the Last Year: Never true    Ran Out of Food in the Last Year: Never true  Transportation Needs: No Transportation Needs (07/02/2019)   PRAPARE - Radiographer, therapeutic (Medical): No    Lack of Transportation (Non-Medical): No  Physical Activity: Inactive (07/02/2019)   Exercise Vital Sign    Days of Exercise per Week: 0 days    Minutes of Exercise per Session: 0 min  Stress: Stress Concern Present (07/02/2019)   [employer]    Feeling of Stress : Rather much  Social Connections: Not on file   Past Surgical History:  Procedure Laterality Date   ANTERIOR CERVICAL DECOMP/DISCECTOMY FUSION  2004   C5/6, C6/7   ANTERIOR CERVICAL DECOMP/DISCECTOMY FUSION  02/2016   C3/4, C4/5 with plating [name])   ANTERIOR CERVICAL DECOMP/DISCECTOMY FUSION  12/29/2021   Procedure: ANTERIOR CERVICAL DECOMPRESSION/ DISCECTOMY FUSION CERVICAL THREE - SEVEN;  Surgeon: Newman Pies, MD;  Location: [facility];  Service: Neurosurgery;;   ANTERIOR CERVICAL DECOMPRESSION/DISCECTOMY FUSION 4 LEVELS N/A 12/29/2021   Procedure: CERVICAL FIVE-SIX CORPECTOMY,;  Surgeon: Newman Pies, MD;  Location: [place];  Service:  Neurosurgery;  Laterality: N/A;   AUGMENTATION MAMMAPLASTY Bilateral 03/25/2010   BREAST ENHANCEMENT SURGERY  2010   BREAST IMPLANT EXCHANGE  10/2014   exchange saline implants, B mastopexy/capsulorraphy ([facility])   BUNIONECTOMY Bilateral yrs ago   [place]   x 1   CHOLECYSTECTOMY  06/11/2012   Procedure: LAPAROSCOPIC CHOLECYSTECTOMY WITH INTRAOPERATIVE CHOLANGIOGRAM;  Surgeon: [name], MD,FACS;  Location: [facility];  Service: General;  Laterality: N/A;   COLONOSCOPY  02/2018   done for positive cologuard - WNL, rpt 10 yrs [facility] Plan)   CYSTOSCOPY     ERCP  05/22/2012   Procedure: ENDOSCOPIC RETROGRADE CHOLANGIOPANCREATOGRAPHY (ERCP);  Surgeon: [name], MD,FACG;  Location: [facility];  Service: Endoscopy;  Laterality: N/A;   ERCP N/A 09/17/2013   Procedure: ENDOSCOPIC RETROGRADE CHOLANGIOPANCREATOGRAPHY (ERCP);  Surgeon: [name], MD;  Location: [facility];  Service: Endoscopy;  Laterality: N/A;   ERCP N/A 09/27/2018   Procedure: ENDOSCOPIC RETROGRADE CHOLANGIOPANCREATOGRAPHY (ERCP);  Surgeon: [name], MD;  Location: [facility];  Service: Endoscopy;  Laterality: N/A;   ESOPHAGOGASTRODUODENOSCOPY  09/2016   WNL. esophagus dilated [place] Plan)   HEMORRHOID BANDING  09/23/2013   --Dr. Greer Pickerel   HERNIA REPAIR     inguinal   HYSTEROSCOPY W/ ENDOMETRIAL ABLATION     KNEE ARTHROSCOPY Right 04/06/2021   Procedure: RIGHT KNEE ARTHROSCOPY WITH DEBRIDEMENT;  Surgeon: [name], MD;  Location: [facility];  Service: Orthopedics;  Laterality: Right;   NASAL SINUS SURGERY     x5   PARTIAL KNEE ARTHROPLASTY Right 06/01/2020   Procedure: Right knee patellofemoral replacement;  Surgeon: [name], MD;  Location: [place];  Service: Orthopedics;  Laterality: Right;   POSTERIOR CERVICAL FUSION/FORAMINOTOMY N/A 12/29/2021   Procedure: POSTERIOR CERVICAL FUSION CERVICAL THREE-SEVEN;  Surgeon: Newman Pies, MD;  Location: [place] [facility];  Service: Neurosurgery;  Laterality: N/A;   REMOVAL OF STONES  09/27/2018   Procedure: REMOVAL OF STONES;  Surgeon: [name], MD;  Location: [facility];  Service: Endoscopy;;   SPHINCTEROTOMY  09/27/2018   Procedure: SPHINCTEROTOMY;  Surgeon: [name], MD;  Location: [facility];  Service: Endoscopy;;   TONSILLECTOMY     removed as a child   TOTAL HIP ARTHROPLASTY Left 03/12/2019   Procedure: LEFT TOTAL HIP ARTHROPLASTY ANTERIOR APPROACH;  [name], [name], MD)   UPPER GASTROINTESTINAL ENDOSCOPY     Past Medical History:  Diagnosis Date   Abdominal pain last 4 months   and nausea also   Allergy    Anemia    history of   Anxiety    Bipolar disorder ([place])    atpical bipolar disorder   Cervical disc disease limited rom turning to left   hx. C6- C7 -hx. past fusion(bone graft used)   Cholecystitis    Chronic pain    DDD  (degenerative disc disease), lumbar 09/2015   dextroscoliosis with multilevel DDD and facet arthrosis most notable for R foraminal disc protrusion L4/5 producing severe R neural foraminal stenosis abutting R L4 nerve root, moderate spinal canal and mild lat recesss and R neural foraminal stenosis L3/4 (MRI)   Depression    bipolar depression   Disorders of porphyrin metabolism    Felon of finger of left hand 11/10/2016   Fibromyalgia    GERD (gastroesophageal reflux disease)    Headache    occasionally   Internal hemorrhoids    Interstitial cystitis 06/06/2012   hx.   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 (Dr Baird Cancer retinologist)   Tuberculosis    BP 131/83   Pulse (!) 102   Ht '5\' 2"'$  (1.575 m)   Wt 131 lb (59.4 kg)   LMP 07/25/1998   SpO2 98%   BMI 23.96 kg/m   Opioid Risk Score:   Fall Risk Score:  `1  Depression screen [facility] 2/9     01/26/2022   11:22 AM 09/21/2021   12:46 PM 08/17/2021   10:54 AM 10/13/2020   11:24 AM 07/10/2020   12:15 PM 07/02/2019   11:21 AM 06/28/2018   10:37 AM  Depression screen PHQ 2/9  Decreased Interest 0 0 0 0 0 0 1  Down, Depressed, Hopeless 0 0 0 0 0 0 2  PHQ - 2 Score 0 0 0 0 0 0 3  Altered sleeping 0 '1 3 1 3 '$ 0 3  Tired, decreased energy '1 2 3 1 2 '$ 0 3  Change in appetite 0 0 0 0 0 0 0  Feeling bad or failure about yourself  0 0 0 0 0 0 1  Trouble concentrating 0 0 0 0 0 0 1  Moving slowly or fidgety/restless 0 0 0 0 0 0 1  Suicidal thoughts 0 0 0 0 0 0 0  PHQ-9 Score '1 3 6 2 5 '$ 0 12  Difficult doing  work/chores    Not difficult at all  Not difficult at all Somewhat difficult    Review of Systems  Musculoskeletal:  Positive for gait problem and neck pain.       Pain in the left shoulder & left arm  Neurological:  Positive for weakness and numbness.       Tingling  All other systems reviewed and are negative.     Objective:   Physical Exam Vitals and nursing note reviewed.  Constitutional:      Appearance: Normal appearance.  Cardiovascular:     Rate and Rhythm: Normal rate and regular rhythm.     Pulses: Normal pulses.     Heart sounds: Normal heart sounds.  Pulmonary:     Effort: Pulmonary effort is normal.     Breath sounds: Normal breath sounds.  Musculoskeletal:     Cervical back: Normal range of motion and neck supple.     Comments: Normal Muscle Bulk and Muscle Testing Reveals:  Upper Extremities:Decreased  ROM 45  Degrees and Muscle Strength 5/5 Left AC Joint Tenderness Thoracic Hypersensitivity T-1-T-7 Bilateral Greater Trochanter Tenderness Lower Extremities: Full ROM and Muscle Strength 5/5 Arises from Table slowly using walker for support Narrow Based  Gait     Skin:    General: Skin is warm and dry.  Neurological:     Mental Status: She is alert and oriented to person, place, and time.  Psychiatric:        Mood and Affect: Mood normal.        Behavior: Behavior normal.  Assessment & Plan:  Cervical fusion spine and cervical radiculopathy. S/P 3 stage procedure: C6 corpectomy; open reduction internal fixation C5-6 fracture subluxation; C5-6 and C6-7 interbody arthrodesis with . She underwent a revision on 12/30/2021 by Dr [name].  REVISION OF POSTERIOR CERVICAL INSTRUMENTATION. Dr [name] following. Continue outpatient therapy.   F/U with Dr Dagoberto Ligas

## 2022-02-01 NOTE — Therapy (Signed)
OUTPATIENT OCCUPATIONAL THERAPY NEURO EVALUATION  Patient Name: Rachel Duran MRN: [id number] DOB:1956/03/06, 66 y.o., female Today's Date: 02/02/2022  PCP: Ria Bush, MD  REFERRING PROVIDER: Courtney Heys, MD    OT End of Session - 02/02/22 1321     Visit Number 1    Number of Visits 17    Date for OT Re-Evaluation 05/05/22    Authorization Type [employer] MCR    OT Start Time 1230    OT Stop Time 1315    OT Time Calculation (min) 45 min    Activity Tolerance Patient tolerated treatment well    Behavior During Therapy WFL for tasks assessed/performed             Past Medical History:  Diagnosis Date   Abdominal pain last 4 months   and nausea also   Allergy    Anemia    history of   Anxiety    Bipolar disorder ([place])    atpical bipolar disorder   Cervical disc disease limited rom turning to left   hx. C6- C7 -hx. past fusion(bone graft used)   Cholecystitis    Chronic pain    DDD (degenerative disc disease), lumbar 09/2015   dextroscoliosis with multilevel DDD and facet arthrosis most notable for R foraminal disc protrusion L4/5 producing severe R neural foraminal stenosis abutting R L4 nerve root, moderate spinal canal and mild lat recesss and R neural foraminal stenosis L3/4 (MRI)   Depression    bipolar depression   Disorders of porphyrin metabolism    Felon of finger of left hand 11/10/2016   Fibromyalgia    GERD (gastroesophageal reflux disease)    Headache    occasionally   Internal hemorrhoids    Interstitial cystitis 06/06/2012   hx.   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 (Dr Baird Cancer retinologist)   Tuberculosis    Past Surgical History:  Procedure Laterality Date   ANTERIOR CERVICAL DECOMP/DISCECTOMY FUSION  2004   C5/6, C6/7   ANTERIOR CERVICAL DECOMP/DISCECTOMY FUSION  02/2016   C3/4, C4/5 with plating [name])   ANTERIOR CERVICAL DECOMP/DISCECTOMY FUSION  12/29/2021   Procedure: ANTERIOR CERVICAL DECOMPRESSION/ DISCECTOMY FUSION CERVICAL THREE - SEVEN;  Surgeon: Newman Pies, MD;  Location: [place];  Service: Neurosurgery;;   ANTERIOR CERVICAL DECOMPRESSION/DISCECTOMY FUSION 4 LEVELS N/A 12/29/2021   Procedure: CERVICAL FIVE-SIX CORPECTOMY,;  Surgeon: Newman Pies, MD;  Location: [facility];  Service: Neurosurgery;  Laterality: N/A;   AUGMENTATION MAMMAPLASTY Bilateral 03/25/2010   BREAST ENHANCEMENT SURGERY  2010   BREAST IMPLANT EXCHANGE  10/2014   exchange saline implants, B mastopexy/capsulorraphy ([facility])   BUNIONECTOMY Bilateral yrs ago   [place]   x 1   CHOLECYSTECTOMY  06/11/2012   Procedure: LAPAROSCOPIC CHOLECYSTECTOMY WITH INTRAOPERATIVE CHOLANGIOGRAM;  Surgeon: [name], MD,FACS;  Location: [facility];  Service: General;  Laterality: N/A;   COLONOSCOPY  02/2018   done for positive cologuard - WNL, rpt 10 yrs Fuller Plan)  CYSTOSCOPY     ERCP  05/22/2012   Procedure: ENDOSCOPIC RETROGRADE CHOLANGIOPANCREATOGRAPHY (ERCP);  Surgeon: [name], MD,FACG;  Location: [facility];  Service: Endoscopy;  Laterality: N/A;   ERCP N/A 09/17/2013   Procedure: ENDOSCOPIC RETROGRADE CHOLANGIOPANCREATOGRAPHY (ERCP);  Surgeon: [name], MD;  Location: [facility];  Service: Endoscopy;  Laterality: N/A;   ERCP N/A 09/27/2018   Procedure: ENDOSCOPIC RETROGRADE CHOLANGIOPANCREATOGRAPHY (ERCP);  Surgeon: [name], MD;  Location: [facility];  Service: Endoscopy;  Laterality: N/A;   ESOPHAGOGASTRODUODENOSCOPY  09/2016   WNL. esophagus dilated Fuller Plan)    HEMORRHOID BANDING  09/23/2013   --Dr. Greer Pickerel   HERNIA REPAIR     inguinal   HYSTEROSCOPY W/ ENDOMETRIAL ABLATION     KNEE ARTHROSCOPY Right 04/06/2021   Procedure: RIGHT KNEE ARTHROSCOPY WITH DEBRIDEMENT;  Surgeon: [name], MD;  Location: [place];  Service: Orthopedics;  Laterality: Right;   NASAL SINUS SURGERY     x5   PARTIAL KNEE ARTHROPLASTY Right 06/01/2020   Procedure: Right knee patellofemoral replacement;  Surgeon: [name], MD;  Location: [place];  Service: Orthopedics;  Laterality: Right;   POSTERIOR CERVICAL FUSION/FORAMINOTOMY N/A 12/29/2021   Procedure: POSTERIOR CERVICAL FUSION CERVICAL THREE-SEVEN;  Surgeon: Newman Pies, MD;  Location: [place];  Service: Neurosurgery;  Laterality: N/A;   REMOVAL OF STONES  09/27/2018   Procedure: REMOVAL OF STONES;  Surgeon: [name], MD;  Location[facility];  Service: Endoscopy;;   SPHINCTEROTOMY  09/27/2018   Procedure: SPHINCTEROTOMY;  Surgeon: [name], MD;  [facility];  Service: Endoscopy;;   TONSILLECTOMY     removed as a child   TOTAL HIP ARTHROPLASTY Left 03/12/2019   Procedure: LEFT TOTAL HIP ARTHROPLASTY ANTERIOR APPROACH;  [name], [name], MD)   UPPER GASTROINTESTINAL ENDOSCOPY     Patient Active Problem List   Diagnosis Date Noted   Fusion of spine, cervical region    Anemia    Primary hypertension    Radiculopathy 01/07/2022   Cervical radiculopathy 12/29/2021   C6 cervical fracture ([place]) 12/29/2021   C5 cervical fracture ([place]) 12/21/2021   Advanced directives, counseling/discussion 09/23/2021   Strep throat 08/18/2021   Swelling of right index finger 07/14/2021   Synovitis of right knee    Scoliosis 01/13/2021   Osteopenia 07/02/2020   Status post left knee surgery 06/01/2020   Pulmonary nodule 05/27/2020   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   Chronic patellofemoral pain of right knee 05/05/2020    Positive ANA (antinuclear antibody) 06/05/2019   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   Unilateral primary osteoarthritis, left hip 01/28/2019   Pelvic pain 12/12/2018   Common bile duct (CBD) obstruction    Venous insufficiency of left lower extremity 07/04/2018   Dyslipidemia 09/22/2017   DDD (degenerative disc disease), cervical 04/03/2017   DDD (degenerative disc disease), lumbar 04/03/2017   Onychomycosis 03/21/2017   Dysphagia 10/18/2016   Encounter for chronic pain management 10/18/2016   Biliary stasis 09/26/2016   HNP (herniated nucleus pulposus), lumbar 09/23/2015   Iron deficiency 07/30/2015   Health maintenance examination 06/15/2015   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   Clavicle enlargement 12/13/2014   Prediabetes 12/13/2014   Plantar fasciitis, bilateral 09/11/2014   Medicare annual wellness visit, subsequent 06/10/2014   Abnormal thyroid function test 06/10/2014   Chronic pain syndrome 06/10/2014   CFS (chronic fatigue syndrome) 04/08/2014   Postmenopausal atrophic vaginitis 10/19/2012   Positive QuantiFERON-TB Gold test  02/07/2012   Cervical disc disorder with radiculopathy of cervical region 05/28/2010   APHTHOUS ULCERS 01/31/2008   Insomnia 11/09/2007   Constipation 10/11/2007   Allergic rhinitis 04/12/2007   MDD (major depressive disorder), recurrent episode, moderate ([place]) 03/05/2007   [name] syndrome 03/05/2007   GERD 03/05/2007   ROSACEA 03/05/2007   NEURALGIA 03/05/2007   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  I still can't write well with the right arm and I'm right handed.   Pt accompanied by: self  PERTINENT HISTORY:  Chronic pain, [name] syndrome, GERD, cervical radiculopathy, fibromyalgia, scleroderma, herniated lumbar disc L4/L5, DDD, Partial right knee arthroplasty, scoliosis, Lt THA, interstitial cystitis From rehab admit note:  "ACDF 2004 C5-6 and C6-7 and 2017 C3-4 and C4-5, left total hip arthroplasty 2020, scleroderma.  Presented 12/29/2021 with noted history of fall 10/29/2021 suffering a C5-6 fracture subluxation/cervical kyphosis with persistent neck pain and initial conservative care with failed medical management.  Underwent 3 stage anterior posterior fusion C5-6 corpectomy, posterior fusion C3-7, ACDF C3-7 12/29/2021.  Postop she developed left hand weakness consistent with a C8 radiculopathy as well as small amount of epidural fluid at C5-6.  Cervical CT demonstrated that the left T1 pedicle screw was a bit low and into the neural foramen.  Patient underwent revision of posterior cervical instrumentation 12/30/2021 per Dr. Newman Pies.  Placed in a cervical hard collar when out of bed."   PRECAUTIONS: Cervical and Fall-cervical hard collar when OOB, NO shoulder ROM > 90* until further clarification from MD, no lifting  WEIGHT BEARING RESTRICTIONS No  PAIN:  Are you having pain? Yes: NPRS scale: 3/10 Pain location: Neck and Lt arm Pain description: Lt tingling/numb and achy from shoulder to hand, throbbing in Lt shoulder blade Aggravating factors: dressing, exercise Relieving factors: rest, elevation, meds   FALLS: Has patient fallen in last 6 months? Yes. Number of falls 1 (on April 7th)  LIVING ENVIRONMENT: Lives with: lives with their spouse Lives in: House/apartment Stairs: Yes: Internal: 14 steps; on left going up; garage entrance has 1 step  up, but front is level entry and she uses both. Master bedroom on first floor Has following equipment at home: Single point cane, Walker - 2 wheeled, shower chair, Shower bench, and Grab bars  PLOF: Requires assistive device for independence, Needs assistance with ADLs, Needs assistance with homemaking, and Needs assistance with transfers-needs help getting over lip into shower  PATIENT GOALS get Rt arm a little stronger, writing, and get Lt arm better and stronger  OBJECTIVE:   HAND DOMINANCE: Right  ADLs: Overall ADLs: on disability from previous medical hx Transfers/ambulation related to ADLs: Eating: dep for cutting food, mod I for eating Grooming: mod I for brushing teeth/hair, assist to blow dry hair UB Dressing: mod I w/ extra time LB Dressing: seated, difficulty w/ getting Lt leg in pants Toileting: mod I w/ elevated toilet and walker Bathing: mod assist Tub Shower transfers: close sup/CGA w/ grab bars and shower chair Equipment:  Shower seat without back, Grab bars, and hand held shower   IADLs: Shopping: dep Light housekeeping: dep Meal Prep: dep Community mobility: relies on family/friends Medication management: mod I w/ use of pill box Financial management: dep Handwriting: 100% legible (name, and still difficulty)   MOBILITY STATUS:  uses walker  POSTURE COMMENTS:  scoliosis  but WFL's   UPPER EXTREMITY ROM   BUE AROM WNL's (shouders tested only to 90* d/t possible precautions). Pt has intrinsic movement in hands however does report atrophy of arm, forearm, and hand muscles LUE   HAND FUNCTION: Grip strength: Right: 46.9 lbs; Left: 29.3 lbs and 3 point pinch: Right: 10 lbs, Left: 7 lbs  COORDINATION: 9 Hole Peg test: Right: 26.78 sec; Left: 31.28 sec  SENSATION: Pt reports Rt hand intact, however Lt hand always numb/tingling and inconsistent w/ feeling things in Lt hand  EDEMA: none   COGNITION: Overall cognitive status: Within functional limits for  tasks assessed  VISION: Subjective report: I have Stargardt's disease which affects peripheral vision first, but eventually, I will lose all my vision Baseline vision: Wears contacts and Wears glasses for distance only  VISION ASSESSMENT: Pt reports decreased Lt upper quadrant vision   OBSERVATIONS: pt has hard cervical collar, uses walker, pt has Raynauds and scleroderma, DIP joints of both hands (Rt > Lt) w/ arthritis and lateral shift   TODAY'S TREATMENT:  Not yet addressed   PATIENT EDUCATION: Education details: OT POC Person educated: Patient Education method: Explanation Education comprehension: verbalized understanding   HOME EXERCISE PROGRAM: N/A today    GOALS: Goals reviewed with patient? Yes  SHORT TERM GOALS: Target date: 03/05/22  Independent w/ HEP for bilateral hand coordination and strength (more focus on LUE)  Baseline: Goal status: INITIAL  2.  Pt to write short paragraph maintaining 90% or greater legibility Baseline:  Goal status: INITIAL  3.  Pt to cut food mod I w/ AE prn Baseline:  Goal status: INITIAL  4.  Pt to perform bathing w/ direct supervision only using AE prn Baseline:  Goal status: INITIAL  5.  Pt to make simple snack/sandwich w/ supervision Baseline:  Goal status: INITIAL   LONG TERM GOALS: Target date: 05/05/22  Independent w/ updated BUE strengthening HEP  Baseline:  Goal status: INITIAL  2.  Pt to improve coordination bilaterally as evidenced by performing 9 hole peg test in 23 sec or less Rt hand, and 27 sec or less Lt hand Baseline: Rt = 26.78 sec, Lt = 31.28 sec Goal status: INITIAL  3.  Pt to improve grip strength by 5 lbs Rt hand, by 10 lbs Lt hand Baseline: Rt = 46 lbs, Lt = 29 lbs Goal status: INITIAL  4.  Pt to return to simple stovetop cooking with supervision Baseline:  Goal status: INITIAL  5.  Pt to perform light IADLS (laundry, making bed, washing dishes) at mod I level Baseline:  Goal status:  INITIAL   ASSESSMENT:  CLINICAL IMPRESSION: Patient is a pleasant 66 y.o. female who was seen today for occupational therapy evaluation for cervical radiculopathy s/p cervical fusion on 12/29/21 w/ revision on 12/30/21. Pt presents with decreased BUE strength and coordination (Lt side worse than Rt), LUE pain and decreased sensation, decreased endurance, decreased mobility and balance and would benefit from O.T. to address these deficits, increase ease, safety, and independence w/ ADLS, reduce caregiver burden of care, and return pt to PLOF as able.   PERFORMANCE DEFICITS in functional skills including ADLs, IADLs, coordination,  dexterity, sensation, ROM, strength, pain, FMC, mobility, balance, body mechanics, endurance, decreased knowledge of precautions, decreased knowledge of use of DME, and UE functional use.   IMPAIRMENTS are limiting patient from ADLs, IADLs, leisure, and social participation.   COMORBIDITIES has co-morbidities such as scleroderma, Raynauds, scoliosis, OA  that affects occupational performance. Patient will benefit from skilled OT to address above impairments and improve overall function.  MODIFICATION OR ASSISTANCE TO COMPLETE EVALUATION: Min-Moderate modification of tasks or assist with assess necessary to complete an evaluation.  OT OCCUPATIONAL PROFILE AND HISTORY: Detailed assessment: Review of records and additional review of physical, cognitive, psychosocial history related to current functional performance.  CLINICAL DECISION MAKING: Moderate - several treatment options, min-mod task modification necessary  REHAB POTENTIAL: Good  EVALUATION COMPLEXITY: Moderate    PLAN: OT FREQUENCY: 2x/week  OT DURATION: 12 weeks (anticipate only 8-10 weeks needed)   PLANNED INTERVENTIONS: self care/ADL training, therapeutic exercise, therapeutic activity, neuromuscular re-education, passive range of motion, functional mobility training, splinting, paraffin, fluidotherapy,  moist heat, cryotherapy, patient/family education, energy conservation, coping strategies training, DME and/or AE instructions, and Re-evaluation  RECOMMENDED OTHER SERVICES: NONE  CONSULTED AND AGREED WITH PLAN OF CARE: Patient  PLAN FOR NEXT SESSION: Coordination and putty HEP, check on clarification of precautions   Hans Eden, OT 02/02/2022, 1:38 PM

## 2022-02-02 ENCOUNTER — Encounter: Payer: Self-pay | Admitting: Physical Therapy

## 2022-02-02 ENCOUNTER — Ambulatory Visit: Payer: Medicare Other | Admitting: Occupational Therapy

## 2022-02-02 ENCOUNTER — Telehealth: Payer: Self-pay | Admitting: Occupational Therapy

## 2022-02-02 ENCOUNTER — Ambulatory Visit: Payer: Medicare Other | Attending: Physical Medicine and Rehabilitation | Admitting: Physical Therapy

## 2022-02-02 ENCOUNTER — Encounter: Payer: Self-pay | Admitting: Occupational Therapy

## 2022-02-02 DIAGNOSIS — M5410 Radiculopathy, site unspecified: Secondary | ICD-10-CM | POA: Insufficient documentation

## 2022-02-02 DIAGNOSIS — R208 Other disturbances of skin sensation: Secondary | ICD-10-CM | POA: Insufficient documentation

## 2022-02-02 DIAGNOSIS — M79602 Pain in left arm: Secondary | ICD-10-CM

## 2022-02-02 DIAGNOSIS — R2681 Unsteadiness on feet: Secondary | ICD-10-CM | POA: Diagnosis present

## 2022-02-02 DIAGNOSIS — R278 Other lack of coordination: Secondary | ICD-10-CM | POA: Insufficient documentation

## 2022-02-02 DIAGNOSIS — M25661 Stiffness of right knee, not elsewhere classified: Secondary | ICD-10-CM | POA: Diagnosis present

## 2022-02-02 DIAGNOSIS — R2689 Other abnormalities of gait and mobility: Secondary | ICD-10-CM | POA: Insufficient documentation

## 2022-02-02 DIAGNOSIS — M6281 Muscle weakness (generalized): Secondary | ICD-10-CM

## 2022-02-02 NOTE — Therapy (Signed)
OUTPATIENT PHYSICAL THERAPY NEURO TREATMENT   Patient Name: Rachel Duran MRN: [id number] DOB:1956/03/11, 66 y.o., female Today's Date: 02/02/2022   PCP: Ria Bush, MD REFERRING PROVIDER: Courtney Heys, MD    PT End of Session - 02/02/22 1321     Visit Number 2    Number of Visits 19   18 + eval   Date for PT Re-Evaluation 04/29/22   pushed out for scheduling   Authorization Type [employer]IC[employer]    Progress Note Due on Visit 10    PT Start Time 1317   received from OT   PT Stop Time 1407    PT Time Calculation (min) 50 min    Equipment Utilized During Treatment Gait belt;Cervical collar    Activity Tolerance Patient tolerated treatment well    Behavior During Therapy WFL for tasks assessed/performed             Past Medical History:  Diagnosis Date   Abdominal pain last 4 months   and nausea also   Allergy    Anemia    history of   Anxiety    Bipolar disorder ([place])    atpical bipolar disorder   Cervical disc disease limited rom turning to left   hx. C6- C7 -hx. past fusion(bone graft used)   Cholecystitis    Chronic pain    DDD (degenerative disc disease), lumbar 09/2015   dextroscoliosis with multilevel DDD and facet arthrosis most notable for R foraminal disc protrusion L4/5 producing severe R neural foraminal stenosis abutting R L4 nerve root, moderate spinal canal and mild lat recesss and R neural foraminal stenosis L3/4 (MRI)   Depression    bipolar depression   Disorders of porphyrin metabolism    Felon of finger of left hand 11/10/2016   Fibromyalgia    GERD (gastroesophageal reflux disease)    Headache    occasionally   Internal hemorrhoids    Interstitial cystitis 06/06/2012   hx.   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 (Dr Baird Cancer retinologist)   Tuberculosis    Past Surgical History:  Procedure Laterality Date   ANTERIOR CERVICAL DECOMP/DISCECTOMY FUSION  2004   C5/6, C6/7   ANTERIOR CERVICAL DECOMP/DISCECTOMY FUSION  02/2016   C3/4, C4/5 with plating [name])   ANTERIOR CERVICAL DECOMP/DISCECTOMY FUSION  12/29/2021   Procedure: ANTERIOR CERVICAL DECOMPRESSION/ DISCECTOMY FUSION CERVICAL THREE - SEVEN;  Surgeon: Newman Pies, MD;  Location: [place];  Service: Neurosurgery;;   ANTERIOR CERVICAL DECOMPRESSION/DISCECTOMY FUSION 4 LEVELS N/A 12/29/2021   Procedure: CERVICAL FIVE-SIX CORPECTOMY,;  Surgeon: Newman Pies, MD;  Location: [place];  Service: Neurosurgery;  Laterality: N/A;   AUGMENTATION MAMMAPLASTY Bilateral 03/25/2010   BREAST ENHANCEMENT SURGERY  2010   BREAST IMPLANT EXCHANGE  10/2014   exchange saline implants, B mastopexy/capsulorraphy ([facility])   BUNIONECTOMY Bilateral yrs ago   [place]   x 1   CHOLECYSTECTOMY  06/11/2012   Procedure: LAPAROSCOPIC CHOLECYSTECTOMY WITH INTRAOPERATIVE CHOLANGIOGRAM;  Surgeon: [name], MD,FACS;  Location: [facility];  Service: General;  Laterality: N/A;   COLONOSCOPY  02/2018   done for positive cologuard - WNL, rpt 10 yrs Fuller Plan)   CYSTOSCOPY     ERCP  05/22/2012   Procedure: ENDOSCOPIC RETROGRADE CHOLANGIOPANCREATOGRAPHY (ERCP);  Surgeon: [name], MD,FACG;  Location: [facility];  Service: Endoscopy;  Laterality: N/A;   ERCP N/A 09/17/2013   Procedure: ENDOSCOPIC RETROGRADE CHOLANGIOPANCREATOGRAPHY (ERCP);  Surgeon: [name], MD;  Location: [facility];  Service: Endoscopy;  Laterality: N/A;   ERCP N/A 09/27/2018   Procedure: ENDOSCOPIC RETROGRADE CHOLANGIOPANCREATOGRAPHY  (ERCP);  Surgeon: [name], MD;  Location: [facility];  Service: Endoscopy;  Laterality: N/A;   ESOPHAGOGASTRODUODENOSCOPY  09/2016   WNL. esophagus dilated Fuller Plan)   HEMORRHOID BANDING  09/23/2013   --Dr. Greer Pickerel   HERNIA REPAIR     inguinal   HYSTEROSCOPY W/ ENDOMETRIAL ABLATION     KNEE ARTHROSCOPY Right 04/06/2021   Procedure: RIGHT KNEE ARTHROSCOPY WITH DEBRIDEMENT;  Surgeon: [name], MD;  Location: [place];  Service: Orthopedics;  Laterality: Right;   NASAL SINUS SURGERY     x5   PARTIAL KNEE ARTHROPLASTY Right 06/01/2020   Procedure: Right knee patellofemoral replacement;  Surgeon: [name], MD;  Location: [place];  Service: Orthopedics;  Laterality: Right;   POSTERIOR CERVICAL FUSION/FORAMINOTOMY N/A 12/29/2021   Procedure: POSTERIOR CERVICAL FUSION CERVICAL THREE-SEVEN;  Surgeon: Newman Pies, MD;  Location: [place];  Service: Neurosurgery;  Laterality: N/A;   REMOVAL OF STONES  09/27/2018   Procedure: REMOVAL OF STONES;  Surgeon: [name], MD;  Location: [facility];  Service: Endoscopy;;   SPHINCTEROTOMY  09/27/2018   Procedure: SPHINCTEROTOMY;  Surgeon: [name], MD;  [facility];  Service: Endoscopy;;   TONSILLECTOMY     removed as a child   TOTAL HIP ARTHROPLASTY Left 03/12/2019   Procedure: LEFT TOTAL HIP ARTHROPLASTY ANTERIOR APPROACH;  [name], [name], MD)   UPPER GASTROINTESTINAL ENDOSCOPY     Patient Active Problem List   Diagnosis Date Noted   Fusion of spine, cervical region    Anemia    Primary hypertension    Radiculopathy 01/07/2022   Cervical radiculopathy 12/29/2021   C6 cervical fracture ([place]) 12/29/2021   C5 cervical fracture ([place]) 12/21/2021   Advanced directives, counseling/discussion 09/23/2021   Strep throat 08/18/2021   Swelling of right index finger 07/14/2021   Synovitis of right knee    Scoliosis 01/13/2021   Osteopenia 07/02/2020    Status post left knee surgery 06/01/2020   Pulmonary nodule 05/27/2020   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   Chronic patellofemoral pain of right knee 05/05/2020   Positive ANA (antinuclear antibody) 06/05/2019   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   Unilateral primary osteoarthritis, left hip 01/28/2019   Pelvic pain 12/12/2018   Common bile duct (CBD) obstruction    Venous insufficiency of left lower extremity 07/04/2018   Dyslipidemia 09/22/2017   DDD (degenerative disc disease), cervical 04/03/2017   DDD (degenerative disc disease), lumbar 04/03/2017   Onychomycosis 03/21/2017   Dysphagia 10/18/2016   Encounter for chronic pain management 10/18/2016   Biliary stasis 09/26/2016   HNP (herniated nucleus pulposus), lumbar 09/23/2015   Iron deficiency 07/30/2015   Health maintenance examination 06/15/2015   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   Clavicle enlargement 12/13/2014   Prediabetes 12/13/2014   Plantar fasciitis, bilateral 09/11/2014   Medicare annual wellness visit,  subsequent 06/10/2014   Abnormal thyroid function test 06/10/2014   Chronic pain syndrome 06/10/2014   CFS (chronic fatigue syndrome) 04/08/2014   Postmenopausal atrophic vaginitis 10/19/2012   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   Cervical disc disorder with radiculopathy of cervical region 05/28/2010   APHTHOUS ULCERS 01/31/2008   Insomnia 11/09/2007   Constipation 10/11/2007   Allergic rhinitis 04/12/2007   MDD (major depressive disorder), recurrent episode, moderate ([place]) 03/05/2007   [name] syndrome 03/05/2007   GERD 03/05/2007   ROSACEA 03/05/2007   NEURALGIA 03/05/2007   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[place]) 12/06/2006   Chronic interstitial cystitis 12/06/2006   Fibromyalgia 12/06/2006    ONSET DATE: 01/12/2022 (referral)  REFERRING DIAG: M54.10 (ICD-10-CM) - Radiculopathy, site unspecified M43.22 (ICD-10-CM) - Fusion of spine, cervical region   THERAPY  DIAG:  Other abnormalities of gait and mobility  Radiculopathy, unspecified spinal region  Muscle weakness (generalized)  Rationale for Evaluation and Treatment Rehabilitation  SUBJECTIVE:                                                                                                                                                                                              SUBJECTIVE STATEMENT: Pt just saw Dr. [name] and was told to continue wearing hard collar until seeing him again on August 18.  She denies falls or other ambulatory issues.  She has been walking every day and feels the walker slows her down sometimes. Pt accompanied by: family member-granddaugher Hannah  PERTINENT HISTORY: Chronic pain, [name] syndrome, GERD, cervical radiculopathy, fibromyalgia, hereditary macular degeneration (she has most trouble with peripheral vision), herniated lumbar disc L4/L5, DDD, Partial right knee arthroplasty, scoliosis From rehab admit note:  "ACDF 2004 C5-6 and C6-7 and 2017 C3-4 and C4-5, left total hip arthroplasty 2020, scleroderma.  Presented 12/29/2021 with noted history of fall 10/29/2021 suffering a C5-6 fracture subluxation/cervical kyphosis with persistent neck pain and initial conservative care with failed medical management.  Underwent 3 stage anterior posterior fusion C5-6 corpectomy, posterior fusion C3-7, ACDF C3-7 12/29/2021.  Postop she developed left hand weakness consistent with a C8 radiculopathy as well as small amount of epidural fluid at C5-6.  Cervical CT demonstrated that the left T1 pedicle screw was a bit low and into the neural foramen.  Patient underwent revision of posterior cervical instrumentation 12/30/2021 per Dr. Newman Pies.  Placed in a cervical hard collar when out of bed."   PAIN:  Are you having pain? Yes: NPRS scale: 3/10 Pain location: left side of neck into left arm Pain description: achy, tingling, burning, numb, weak Aggravating factors:  Moving Relieving factors: resting, elevating  PRECAUTIONS: Cervical and Fall-cervical hard collar when OOB  WEIGHT BEARING RESTRICTIONS No   OBJECTIVE:   TODAY'S TREATMENT:  Assessed BERG:  [facility] PT Assessment - 02/02/22 1328       Standardized Balance Assessment   Standardized Balance Assessment Berg Balance Test      Berg Balance Test   Sit to Stand Able to stand without using hands and stabilize independently    Standing Unsupported Able to stand safely 2 minutes    Sitting with Back Unsupported but Feet Supported on Floor or Stool Able to sit safely and securely 2 minutes    Stand to Sit Sits safely with minimal use of hands    Transfers Able to transfer safely, definite need of hands    Standing Unsupported with Eyes Closed Able to stand 10 seconds with supervision    Standing Unsupported with Feet Together Able to place feet together independently and stand for 1 minute with supervision    From Standing, Reach Forward with Outstretched Arm Can reach forward >12 cm safely (5")    From Standing Position, Pick up Object from Floor Unable to try/needs assist to keep balance   cervical collar, therapist recommends use of reacher from standing at this time.   From Standing Position, Turn to Look Behind Over each Shoulder Turn sideways only but maintains balance    Turn 360 Degrees Needs close supervision or verbal cueing    Standing Unsupported, Alternately Place Feet on Step/Stool Able to complete 4 steps without aid or supervision    Standing Unsupported, One Foot in Front Able to take small step independently and hold 30 seconds    Standing on One Leg Tries to lift leg/unable to hold 3 seconds but remains standing independently    Total Score 36    Berg comment: high fall risk             -Assessed 6MWT:  900' w/ RW -Provided HEP, see below for details physically reviewed this session w/ edu to granddaughter on safety and modifications as needed.  PATIENT  EDUCATION: Education details: BERG and 6MWT assessment results and initial HEP.  Time spent providing tennis ball to improve drag of RW.  And writing out instructions for next MD visit to have updated collar wear schedule and precautions/restrictions for lifting/ROM as applicable. Person educated: Patient and Spouse Education method: Explanation Education comprehension: verbalized understanding   HOME EXERCISE PROGRAM: Access Code: [email or web address] URL: [email or web address] Date: 02/02/2022 Prepared by: Elease Etienne  Exercises - Sit to Stand with Arms Crossed  - 1 x daily - 4-5 x weekly - 2 sets - 10 reps - Standing Near Stance in Corner with Eyes Closed  - 1 x daily - 4-5 x weekly - 1 sets - 3-4 reps - 30 seconds hold - Tandem Stance in Corner  - 1 x daily - 5 x weekly - 1 sets - 3-4 reps - 30 seconds hold - Standing Single Leg Stance with Counter Support  - 1 x daily - 5 x weekly - 1 sets - 3-4 reps - 30 seconds hold    GOALS: Goals reviewed with patient? Yes  SHORT TERM GOALS: Target date: 02/18/2022  Pt will be independent with strength and balance HEP with supervision from family as needed. Baseline:  To be established. Goal status: INITIAL  2.  Pt will decrease 5xSTS to </=17 seconds w/o UE support in order to demonstrate decreased risk for falls and improved functional bilateral LE strength and power. Baseline: 20.81sec w/  hands on knees Goal status: INITIAL  3.  Pt will demonstrate TUG of </=12 seconds in order to decrease risk of falls and improve functional mobility using LRAD. Baseline: 14.20 sec w/ RW Goal status: INITIAL  4.  Pt will increase BERG balance score to 40/56 to demonstrate improved static balance. Baseline: 36/56 Goal status: INITIAL  5.  Pt will ambulate >/= 1000' feet w/ LRAD on 6MWT to demonstrate improved functional endurance for home and community participation. Baseline: 900' w/ RW Goal status: INITIAL  6.  Pt will report no pain  at rest and during static standing activities to promote improved activity tolerance. Baseline: 3/10 Goal status: INITIAL  LONG TERM GOALS: Target date: 03/18/2022 (8 weeks); 04/15/2022 (12 weeks)  Pt will report return to neighborhood walking 3 days per week to promote return to prior level of function. Baseline:  Pt not walking in neighborhood due to balance. Goal status: INITIAL  2.  Pt will decrease 5xSTS to </=13 seconds w/o UE support in order to demonstrate decreased risk for falls and improved functional bilateral LE strength and power. Baseline: 20.81sec w/ hands on knees Goal status: INITIAL  3.  Pt will increase BERG balance score to 45/56 to demonstrate improved static balance. Baseline: 36/56 Goal status: INITIAL  4.  Pt will ambulate >/= 1200 feet w/ LRAD on 6MWT to demonstrate improved functional endurance for home and community participation. Baseline:  900' w/ RW Goal status: INITIAL  5.  Pt will ambulate >/=1000 feet on various indoor and outdoor surfaces with LRAD and modified independent level of assist to promote household and community access. Baseline: No distance formally calculated on eval. Goal status: INITIAL  6.  Pt will negotiate 14 stairs w/o UE support and reciprocal steps up and down independently to promote improved safety and independence within home environment. Baseline:  Pt demonstrates hesitancy with descending stairs. Goal status: INITIAL  ASSESSMENT:  CLINICAL IMPRESSION: BERG assessed this session with patient scoring 36/56 indicating significant fall risk.  She ambulates 900' w/ use of RW during 6MWT with reported fatigue at end of ambulatory distance.  Initiated HEP to begin addressing some deficits noted in BERG assessment.  Will continue to address deficits noted in POC and progress towards LTGs as able.   OBJECTIVE IMPAIRMENTS Abnormal gait, decreased activity tolerance, decreased balance, decreased endurance, decreased mobility,  difficulty walking, decreased strength, impaired UE functional use, and pain.   ACTIVITY LIMITATIONS carrying, lifting, bending, squatting, and stairs  PARTICIPATION LIMITATIONS: driving, community activity, and yard work  PERSONAL FACTORS Age, Fitness, Past/current experiences, Time since onset of injury/illness/exacerbation, and 1-2 comorbidities: chronic pain and DDD  are also affecting patient's functional outcome.   REHAB POTENTIAL: Good  CLINICAL DECISION MAKING: Evolving/moderate complexity  EVALUATION COMPLEXITY: Moderate  PLAN: PT FREQUENCY:  2x/week for 6 weeks followed by 1x/week for 6 weeks  PT DURATION: 12 weeks (cert 14 weeks - [id number]/[id number])  PLANNED INTERVENTIONS: Therapeutic exercises, Therapeutic activity, Neuromuscular re-education, Balance training, Gait training, Patient/Family education, Joint mobilization, Stair training, Vestibular training, DME instructions, Cryotherapy, Moist heat, Manual therapy, and Re-evaluation  PLAN FOR NEXT SESSION:  Update HEP prn - BLE strengthening and dynamic balance.  Gait training w/ RW vs SPC vs no AD.  Descending stairs.  Pt will be appropriate for FGA in future-maybe at STG assessment will add.  SLS.   Bary Richard, PT, DPT 02/02/2022, 3:31 PM

## 2022-02-02 NOTE — Patient Instructions (Signed)
Access Code: [email or web address] URL: [email or web address] Date: 02/02/2022 Prepared by: Elease Etienne  Exercises - Sit to Stand with Arms Crossed  - 1 x daily - 4-5 x weekly - 2 sets - 10 reps - Standing Near Stance in Corner with Eyes Closed  - 1 x daily - 4-5 x weekly - 1 sets - 3-4 reps - 30 seconds hold - Tandem Stance in Corner  - 1 x daily - 5 x weekly - 1 sets - 3-4 reps - 30 seconds hold - Standing Single Leg Stance with Counter Support  - 1 x daily - 5 x weekly - 1 sets - 3-4 reps - 30 seconds hold

## 2022-02-02 NOTE — Telephone Encounter (Signed)
Dr. [name],   Rachel Duran had cervical fusion on 12/29/21 w/ revision on 12/30/21 and is currently in a hard cervical collar. Can she perform UE ROM above 90 degrees? If so, full ROM?  Also, any lifting restrictions? (For example, could patient lift 5 lbs to waist level but no overhead lifting).   Thank you for clarification,   Redmond Baseman, OTR/L

## 2022-02-04 ENCOUNTER — Encounter: Payer: Self-pay | Admitting: Occupational Therapy

## 2022-02-04 ENCOUNTER — Ambulatory Visit: Payer: Medicare Other | Admitting: Physical Therapy

## 2022-02-04 ENCOUNTER — Ambulatory Visit: Payer: Medicare Other | Admitting: Occupational Therapy

## 2022-02-04 ENCOUNTER — Encounter: Payer: Self-pay | Admitting: Physical Therapy

## 2022-02-04 DIAGNOSIS — R208 Other disturbances of skin sensation: Secondary | ICD-10-CM

## 2022-02-04 DIAGNOSIS — R2681 Unsteadiness on feet: Secondary | ICD-10-CM

## 2022-02-04 DIAGNOSIS — R278 Other lack of coordination: Secondary | ICD-10-CM

## 2022-02-04 DIAGNOSIS — M5410 Radiculopathy, site unspecified: Secondary | ICD-10-CM

## 2022-02-04 DIAGNOSIS — M25661 Stiffness of right knee, not elsewhere classified: Secondary | ICD-10-CM

## 2022-02-04 DIAGNOSIS — M6281 Muscle weakness (generalized): Secondary | ICD-10-CM

## 2022-02-04 DIAGNOSIS — M79602 Pain in left arm: Secondary | ICD-10-CM

## 2022-02-04 DIAGNOSIS — R2689 Other abnormalities of gait and mobility: Secondary | ICD-10-CM

## 2022-02-04 NOTE — Therapy (Unsigned)
OUTPATIENT PHYSICAL THERAPY NEURO TREATMENT   Patient Name: Rachel Duran MRN: [id number] DOB:1956-02-11, 66 y.o., female Today's Date: 02/04/2022   PCP: Ria Bush, MD REFERRING PROVIDER: Courtney Heys, MD    PT End of Session - 02/04/22 1235     Visit Number 3    Number of Visits 19   18 + eval   Date for PT Re-Evaluation 04/29/22   pushed out for scheduling   Authorization Type [employer]IC[employer]    Progress Note Due on Visit 10    PT Start Time 1233    Equipment Utilized During Treatment Gait belt;Cervical collar    Activity Tolerance Patient tolerated treatment well    Behavior During Therapy WFL for tasks assessed/performed             Past Medical History:  Diagnosis Date   Abdominal pain last 4 months   and nausea also   Allergy    Anemia    history of   Anxiety    Bipolar disorder ([place])    atpical bipolar disorder   Cervical disc disease limited rom turning to left   hx. C6- C7 -hx. past fusion(bone graft used)   Cholecystitis    Chronic pain    DDD (degenerative disc disease), lumbar 09/2015   dextroscoliosis with multilevel DDD and facet arthrosis most notable for R foraminal disc protrusion L4/5 producing severe R neural foraminal stenosis abutting R L4 nerve root, moderate spinal canal and mild lat recesss and R neural foraminal stenosis L3/4 (MRI)   Depression    bipolar depression   Disorders of porphyrin metabolism    Felon of finger of left hand 11/10/2016   Fibromyalgia    GERD (gastroesophageal reflux disease)    Headache    occasionally   Internal hemorrhoids    Interstitial cystitis 06/06/2012   hx.   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 (Dr Baird Cancer retinologist)   Tuberculosis    Past Surgical History:  Procedure Laterality Date   ANTERIOR CERVICAL DECOMP/DISCECTOMY FUSION  2004   C5/6, C6/7   ANTERIOR CERVICAL DECOMP/DISCECTOMY FUSION  02/2016   C3/4, C4/5 with plating [name])   ANTERIOR CERVICAL DECOMP/DISCECTOMY FUSION  12/29/2021   Procedure: ANTERIOR CERVICAL DECOMPRESSION/ DISCECTOMY FUSION CERVICAL THREE - SEVEN;  Surgeon: Newman Pies, MD;  Location: [place];  Service: Neurosurgery;;   ANTERIOR CERVICAL DECOMPRESSION/DISCECTOMY FUSION 4 LEVELS N/A 12/29/2021   Procedure: CERVICAL FIVE-SIX CORPECTOMY,;  Surgeon: Newman Pies, MD;  Location: [facility];  Service: Neurosurgery;  Laterality: N/A;   AUGMENTATION MAMMAPLASTY Bilateral 03/25/2010   BREAST ENHANCEMENT SURGERY  2010   BREAST IMPLANT EXCHANGE  10/2014   exchange saline implants, B mastopexy/capsulorraphy ([facility])   BUNIONECTOMY Bilateral yrs ago   [place]   x 1   CHOLECYSTECTOMY  06/11/2012   Procedure: LAPAROSCOPIC CHOLECYSTECTOMY WITH INTRAOPERATIVE CHOLANGIOGRAM;  Surgeon: [name], MD,FACS;  Location: [facility];  Service: General;  Laterality: N/A;   COLONOSCOPY  02/2018  done for positive cologuard - WNL, rpt 10 yrs Fuller Plan)   CYSTOSCOPY     ERCP  05/22/2012   Procedure: ENDOSCOPIC RETROGRADE CHOLANGIOPANCREATOGRAPHY (ERCP);  Surgeon: [name], MD,FACG;  Location: [facility];  Service: Endoscopy;  Laterality: N/A;   ERCP N/A 09/17/2013   Procedure: ENDOSCOPIC RETROGRADE CHOLANGIOPANCREATOGRAPHY (ERCP);  Surgeon: [name], MD;  Location: [facility];  Service: Endoscopy;  Laterality: N/A;   ERCP N/A 09/27/2018   Procedure: ENDOSCOPIC RETROGRADE CHOLANGIOPANCREATOGRAPHY (ERCP);  Surgeon: [name], MD;  Location: [facility];  Service:  Endoscopy;  Laterality: N/A;   ESOPHAGOGASTRODUODENOSCOPY  09/2016   WNL. esophagus dilated Fuller Plan)   HEMORRHOID BANDING  09/23/2013   --Dr. Greer Pickerel   HERNIA REPAIR     inguinal   HYSTEROSCOPY W/ ENDOMETRIAL ABLATION     KNEE ARTHROSCOPY Right 04/06/2021   Procedure: RIGHT KNEE ARTHROSCOPY WITH DEBRIDEMENT;  Surgeon: [name], MD;  Location: [place];  Service: Orthopedics;  Laterality: Right;   NASAL SINUS SURGERY     x5   PARTIAL KNEE ARTHROPLASTY Right 06/01/2020   Procedure: Right knee patellofemoral replacement;  Surgeon: [name], MD;  Location: [facility];  Service: Orthopedics;  Laterality: Right;   POSTERIOR CERVICAL FUSION/FORAMINOTOMY N/A 12/29/2021   Procedure: POSTERIOR CERVICAL FUSION CERVICAL THREE-SEVEN;  Surgeon: Newman Pies, MD;  Location: [place];  Service: Neurosurgery;  Laterality: N/A;   REMOVAL OF STONES  09/27/2018   Procedure: REMOVAL OF STONES;  Surgeon: [name], MD;  Location: [facility];  Service: Endoscopy;;   SPHINCTEROTOMY  09/27/2018   Procedure: SPHINCTEROTOMY;  Surgeon: [name], MD;  [facility];  Service: Endoscopy;;   TONSILLECTOMY     removed as a child   TOTAL HIP ARTHROPLASTY Left 03/12/2019   Procedure: LEFT TOTAL HIP ARTHROPLASTY ANTERIOR APPROACH;  [name], [name], MD)   UPPER GASTROINTESTINAL ENDOSCOPY     Patient Active Problem List   Diagnosis Date Noted   Fusion of spine, cervical region    Anemia    Primary hypertension    Radiculopathy 01/07/2022   Cervical radiculopathy 12/29/2021   C6 cervical fracture ([place]) 12/29/2021   C5 cervical fracture ([place]) 12/21/2021   Advanced directives, counseling/discussion 09/23/2021   Strep throat 08/18/2021   Swelling of right index finger 07/14/2021   Synovitis of right knee    Scoliosis 01/13/2021   Osteopenia 07/02/2020   Status post left knee surgery 06/01/2020   Pulmonary nodule 05/27/2020    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   Chronic patellofemoral pain of right knee 05/05/2020   Positive ANA (antinuclear antibody) 06/05/2019   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   Unilateral primary osteoarthritis, left hip 01/28/2019   Pelvic pain 12/12/2018   Common bile duct (CBD) obstruction    Venous insufficiency of left lower extremity 07/04/2018   Dyslipidemia 09/22/2017   DDD (degenerative disc disease), cervical 04/03/2017   DDD (degenerative disc disease), lumbar 04/03/2017   Onychomycosis 03/21/2017   Dysphagia 10/18/2016   Encounter for chronic pain management 10/18/2016   Biliary stasis 09/26/2016   HNP (herniated nucleus pulposus), lumbar 09/23/2015   Iron deficiency 07/30/2015   Health maintenance examination 06/15/2015   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   Clavicle enlargement 12/13/2014   Prediabetes 12/13/2014   Plantar fasciitis, bilateral 09/11/2014   Medicare annual wellness visit, subsequent 06/10/2014   Abnormal thyroid function test 06/10/2014   Chronic pain syndrome 06/10/2014   CFS (chronic fatigue syndrome) 04/08/2014  Postmenopausal atrophic vaginitis 10/19/2012   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   Cervical disc disorder with radiculopathy of cervical region 05/28/2010   APHTHOUS ULCERS 01/31/2008   Insomnia 11/09/2007   Constipation 10/11/2007   Allergic rhinitis 04/12/2007   MDD (major depressive disorder), recurrent episode, moderate ([place]) 03/05/2007   [name] syndrome 03/05/2007   GERD 03/05/2007   ROSACEA 03/05/2007   NEURALGIA 03/05/2007   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[place]) 12/06/2006   Chronic interstitial cystitis 12/06/2006   Fibromyalgia 12/06/2006    ONSET DATE: 01/12/2022 (referral)  REFERRING DIAG: M54.10 (ICD-10-CM) - Radiculopathy, site unspecified M43.22 (ICD-10-CM) - Fusion of spine, cervical region   THERAPY DIAG:  Muscle weakness (generalized)  Unsteadiness on feet  Other  abnormalities of gait and mobility  Rationale for Evaluation and Treatment Rehabilitation   PERTINENT HISTORY: Chronic pain, [name] syndrome, GERD, cervical radiculopathy, fibromyalgia, hereditary macular degeneration (she has most trouble with peripheral vision), herniated lumbar disc L4/L5, DDD, Partial right knee arthroplasty, scoliosis  From rehab admit note:  "ACDF 2004 C5-6 and C6-7 and 2017 C3-4 and C4-5, left total hip arthroplasty 2020, scleroderma.  Presented 12/29/2021 with noted history of fall 10/29/2021 suffering a C5-6 fracture subluxation/cervical kyphosis with persistent neck pain and initial conservative care with failed medical management.  Underwent 3 stage anterior posterior fusion C5-6 corpectomy, posterior fusion C3-7, ACDF C3-7 12/29/2021.  Postop she developed left hand weakness consistent with a C8 radiculopathy as well as small amount of epidural fluid at C5-6.  Cervical CT demonstrated that the left T1 pedicle screw was a bit low and into the neural foramen.  Patient underwent revision of posterior cervical instrumentation 12/30/2021 per Dr. Newman Pies.  Placed in a cervical hard collar when out of bed."    PRECAUTIONS: Cervical and Fall-cervical hard collar when OOB   SUBJECTIVE: No falls. Right knee is sore today. Feels it is locking up on her at times, especially with corner balance exercises or when she is trying to balance with decreased UE support.                                                                                                                                                                                             PAIN:  Are you having pain? Yes: NPRS scale: 3/10 Pain location: left side of neck into left arm Pain description: achy, tingling, burning, numb, weak Aggravating factors: Moving Relieving factors: resting, elevating    TODAY'S TREATMENT:   BALANCE/NMR: Reviewed corner balance ex's from HEP. Advised pt to rest between the 2  exercise and to keep her weight equal between the legs. Also if tandem  position continues to bother her knee to try modified tandem/staggered stance instead.  Static Standing Balance: Surface: Airex Position: Feet Hip Width Apart Completed with: Eyes Closed;   Partial Tandem Stance:  Surface: Airex  Completed with: Eyes Closed;            STRENGTHENING  Scifit LE/UE's level 2.0 x 8 minutes with goal >/=  steps per minute for strengthening and activity tolerance.      GAIT: Gait pattern: step through pattern and decreased stride length Distance walked: around clinic between tasks with no device, CGA Assistive device utilized: Walker - 2 wheeled, cervical collar, and None Level of assistance: SBA and CGA Comments: mild ataxia at times with no device. Cues needed for increased base of support and step length.           PATIENT EDUCATION: Education details:  Person educated: Patient and Spouse Education method: Explanation Education comprehension: verbalized understanding   HOME EXERCISE PROGRAM: Access Code: [email or web address] URL: [email or web address] Date: 02/02/2022 Prepared by: Elease Etienne  Exercises - Sit to Stand with Arms Crossed  - 1 x daily - 4-5 x weekly - 2 sets - 10 reps - Standing Near Stance in Corner with Eyes Closed  - 1 x daily - 4-5 x weekly - 1 sets - 3-4 reps - 30 seconds hold - Tandem Stance in Corner  - 1 x daily - 5 x weekly - 1 sets - 3-4 reps - 30 seconds hold - Standing Single Leg Stance with Counter Support  - 1 x daily - 5 x weekly - 1 sets - 3-4 reps - 30 seconds hold    GOALS: Goals reviewed with patient? Yes  SHORT TERM GOALS: Target date: 02/18/2022  Pt will be independent with strength and balance HEP with supervision from family as needed. Baseline:  To be established. Goal status: INITIAL  2.  Pt will decrease 5xSTS to </=17 seconds w/o UE support in order to demonstrate decreased risk for falls and improved  functional bilateral LE strength and power. Baseline: 20.81sec w/ hands on knees Goal status: INITIAL  3.  Pt will demonstrate TUG of </=12 seconds in order to decrease risk of falls and improve functional mobility using LRAD. Baseline: 14.20 sec w/ RW Goal status: INITIAL  4.  Pt will increase BERG balance score to 40/56 to demonstrate improved static balance. Baseline: 36/56 Goal status: INITIAL  5.  Pt will ambulate >/= 1000' feet w/ LRAD on 6MWT to demonstrate improved functional endurance for home and community participation. Baseline: 900' w/ RW Goal status: INITIAL  6.  Pt will report no pain at rest and during static standing activities to promote improved activity tolerance. Baseline: 3/10 Goal status: INITIAL  LONG TERM GOALS: Target date: 03/18/2022 (8 weeks); 04/15/2022 (12 weeks)  Pt will report return to neighborhood walking 3 days per week to promote return to prior level of function. Baseline:  Pt not walking in neighborhood due to balance. Goal status: INITIAL  2.  Pt will decrease 5xSTS to </=13 seconds w/o UE support in order to demonstrate decreased risk for falls and improved functional bilateral LE strength and power. Baseline: 20.81sec w/ hands on knees Goal status: INITIAL  3.  Pt will increase BERG balance score to 45/56 to demonstrate improved static balance. Baseline: 36/56 Goal status: INITIAL  4.  Pt will ambulate >/= 1200 feet w/ LRAD on 6MWT to demonstrate improved functional endurance for home and community participation. Baseline:  900' w/ RW Goal status: INITIAL  5.  Pt will ambulate >/=1000 feet on various indoor and outdoor surfaces with LRAD and modified independent level of assist to promote household and community access. Baseline: No distance formally calculated on eval. Goal status: INITIAL  6.  Pt will negotiate 14 stairs w/o UE support and reciprocal steps up and down independently to promote improved safety and independence within  home environment. Baseline:  Pt demonstrates hesitancy with descending stairs. Goal status: INITIAL  ASSESSMENT:  CLINICAL IMPRESSION:   OBJECTIVE IMPAIRMENTS Abnormal gait, decreased activity tolerance, decreased balance, decreased endurance, decreased mobility, difficulty walking, decreased strength, impaired UE functional use, and pain.   ACTIVITY LIMITATIONS carrying, lifting, bending, squatting, and stairs  PARTICIPATION LIMITATIONS: driving, community activity, and yard work  PERSONAL FACTORS Age, Fitness, Past/current experiences, Time since onset of injury/illness/exacerbation, and 1-2 comorbidities: chronic pain and DDD  are also affecting patient's functional outcome.   REHAB POTENTIAL: Good  CLINICAL DECISION MAKING: Evolving/moderate complexity  EVALUATION COMPLEXITY: Moderate  PLAN: PT FREQUENCY:  2x/week for 6 weeks followed by 1x/week for 6 weeks  PT DURATION: 12 weeks (cert 14 weeks - 51/01/6159)  PLANNED INTERVENTIONS: Therapeutic exercises, Therapeutic activity, Neuromuscular re-education, Balance training, Gait training, Patient/Family education, Joint mobilization, Stair training, Vestibular training, DME instructions, Cryotherapy, Moist heat, Manual therapy, and Re-evaluation  PLAN FOR NEXT SESSION:  Update HEP prn - BLE strengthening and dynamic balance.  Gait training w/ RW vs SPC vs no AD.  Descending stairs.  Pt will be appropriate for FGA in future-maybe at [facility] assessment will add.  SLS.    Willow Ora, PTA, [place] [address] [place],  [address] [phone] 02/04/22, 12:36 PM

## 2022-02-04 NOTE — Telephone Encounter (Signed)
error 

## 2022-02-04 NOTE — Therapy (Signed)
OUTPATIENT OCCUPATIONAL THERAPY NEURO TREATMENT  Patient Name: Rachel Duran MRN: [id number] DOB:10/09/1955, 66 y.o., female Today's Date: 02/04/2022  PCP: Ria Bush, MD  REFERRING PROVIDER: Courtney Heys, MD    OT End of Session - 02/04/22 1313     Visit Number 2    Number of Visits 17    Date for OT Re-Evaluation 05/05/22    Authorization Type [employer] MCR    OT Start Time 1314    OT Stop Time 1400    OT Time Calculation (min) 46 min    Activity Tolerance Patient tolerated treatment well    Behavior During Therapy WFL for tasks assessed/performed             Past Medical History:  Diagnosis Date   Abdominal pain last 4 months   and nausea also   Allergy    Anemia    history of   Anxiety    Bipolar disorder ([place])    atpical bipolar disorder   Cervical disc disease limited rom turning to left   hx. C6- C7 -hx. past fusion(bone graft used)   Cholecystitis    Chronic pain    DDD (degenerative disc disease), lumbar 09/2015   dextroscoliosis with multilevel DDD and facet arthrosis most notable for R foraminal disc protrusion L4/5 producing severe R neural foraminal stenosis abutting R L4 nerve root, moderate spinal canal and mild lat recesss and R neural foraminal stenosis L3/4 (MRI)   Depression    bipolar depression   Disorders of porphyrin metabolism    Felon of finger of left hand 11/10/2016   Fibromyalgia    GERD (gastroesophageal reflux disease)    Headache    occasionally   Internal hemorrhoids    Interstitial cystitis 06/06/2012   hx.   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/ [place] /  02/07/12 > referred to Health Dept 02/10/2012     - POS GOLD    01/31/2012     Primary hypertension    Raynauds disease    hx.   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 (Dr Baird Cancer retinologist)   Tuberculosis    Past Surgical History:  Procedure Laterality Date   ANTERIOR CERVICAL DECOMP/DISCECTOMY FUSION  2004   C5/6, C6/7   ANTERIOR CERVICAL DECOMP/DISCECTOMY FUSION  02/2016   C3/4, C4/5 with plating [name])   ANTERIOR CERVICAL DECOMP/DISCECTOMY FUSION  12/29/2021   Procedure: ANTERIOR CERVICAL DECOMPRESSION/ DISCECTOMY FUSION CERVICAL THREE - SEVEN;  Surgeon: Newman Pies, MD;  Location: [place];  Service: Neurosurgery;;   ANTERIOR CERVICAL DECOMPRESSION/DISCECTOMY FUSION 4 LEVELS N/A 12/29/2021   Procedure: CERVICAL FIVE-SIX CORPECTOMY,;  Surgeon: Newman Pies, MD;  Location: [place];  Service: Neurosurgery;  Laterality: N/A;   AUGMENTATION MAMMAPLASTY Bilateral 03/25/2010   BREAST ENHANCEMENT SURGERY  2010   BREAST IMPLANT EXCHANGE  10/2014   exchange saline implants, B mastopexy/capsulorraphy ([place])   BUNIONECTOMY Bilateral yrs ago   [place]   x 1   CHOLECYSTECTOMY  06/11/2012   Procedure: LAPAROSCOPIC CHOLECYSTECTOMY WITH INTRAOPERATIVE CHOLANGIOGRAM;  Surgeon: [name], MD,FACS;  Location: [facility];  Service: General;  Laterality: N/A;   COLONOSCOPY  02/2018   done for positive cologuard - WNL, rpt 10 yrs Fuller Plan)  CYSTOSCOPY     ERCP  05/22/2012   Procedure: ENDOSCOPIC RETROGRADE CHOLANGIOPANCREATOGRAPHY (ERCP);  Surgeon: [name], MD,FACG;  Location: [facility];  Service: Endoscopy;  Laterality: N/A;   ERCP N/A 09/17/2013   Procedure: ENDOSCOPIC RETROGRADE CHOLANGIOPANCREATOGRAPHY (ERCP);  Surgeon: [name], MD;  Location: [facility];  Service: Endoscopy;  Laterality: N/A;   ERCP N/A 09/27/2018   Procedure: ENDOSCOPIC RETROGRADE CHOLANGIOPANCREATOGRAPHY (ERCP);  Surgeon: [name], MD;  Location: [facility];  Service: Endoscopy;  Laterality: N/A;   ESOPHAGOGASTRODUODENOSCOPY  09/2016   WNL. esophagus dilated Fuller Plan)    HEMORRHOID BANDING  09/23/2013   --Dr. Greer Pickerel   HERNIA REPAIR     inguinal   HYSTEROSCOPY W/ ENDOMETRIAL ABLATION     KNEE ARTHROSCOPY Right 04/06/2021   Procedure: RIGHT KNEE ARTHROSCOPY WITH DEBRIDEMENT;  Surgeon: [name], MD;  Location: [place];  Service: Orthopedics;  Laterality: Right;   NASAL SINUS SURGERY     x5   PARTIAL KNEE ARTHROPLASTY Right 06/01/2020   Procedure: Right knee patellofemoral replacement;  Surgeon: [name], MD;  Location: [place];  Service: Orthopedics;  Laterality: Right;   POSTERIOR CERVICAL FUSION/FORAMINOTOMY N/A 12/29/2021   Procedure: POSTERIOR CERVICAL FUSION CERVICAL THREE-SEVEN;  Surgeon: Newman Pies, MD;  Location: [place];  Service: Neurosurgery;  Laterality: N/A;   REMOVAL OF STONES  09/27/2018   Procedure: REMOVAL OF STONES;  Surgeon: [name], MD;  Location: [facility];  Service: Endoscopy;;   SPHINCTEROTOMY  09/27/2018   Procedure: SPHINCTEROTOMY;  Surgeon: [name], MD;  Location: [facility];  Service: Endoscopy;;   TONSILLECTOMY     removed as a child   TOTAL HIP ARTHROPLASTY Left 03/12/2019   Procedure: LEFT TOTAL HIP ARTHROPLASTY ANTERIOR APPROACH;  [name], [name], MD)   UPPER GASTROINTESTINAL ENDOSCOPY     Patient Active Problem List   Diagnosis Date Noted   Fusion of spine, cervical region    Anemia    Primary hypertension    Radiculopathy 01/07/2022   Cervical radiculopathy 12/29/2021   C6 cervical fracture ([place]) 12/29/2021   C5 cervical fracture ([place]) 12/21/2021   Advanced directives, counseling/discussion 09/23/2021   Strep throat 08/18/2021   Swelling of right index finger 07/14/2021   Synovitis of right knee    Scoliosis 01/13/2021   Osteopenia 07/02/2020   Status post left knee surgery 06/01/2020   Pulmonary nodule 05/27/2020   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   Chronic patellofemoral pain of right knee 05/05/2020    Positive ANA (antinuclear antibody) 06/05/2019   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   Unilateral primary osteoarthritis, left hip 01/28/2019   Pelvic pain 12/12/2018   Common bile duct (CBD) obstruction    Venous insufficiency of left lower extremity 07/04/2018   Dyslipidemia 09/22/2017   DDD (degenerative disc disease), cervical 04/03/2017   DDD (degenerative disc disease), lumbar 04/03/2017   Onychomycosis 03/21/2017   Dysphagia 10/18/2016   Encounter for chronic pain management 10/18/2016   Biliary stasis 09/26/2016   HNP (herniated nucleus pulposus), lumbar 09/23/2015   Iron deficiency 07/30/2015   Health maintenance examination 06/15/2015   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   Clavicle enlargement 12/13/2014   Prediabetes 12/13/2014   Plantar fasciitis, bilateral 09/11/2014   Medicare annual wellness visit, subsequent 06/10/2014   Abnormal thyroid function test 06/10/2014   Chronic pain syndrome 06/10/2014   CFS (chronic fatigue syndrome) 04/08/2014   Postmenopausal atrophic vaginitis 10/19/2012   Positive QuantiFERON-TB Gold test  02/07/2012   Cervical disc disorder with radiculopathy of cervical region 05/28/2010   APHTHOUS ULCERS 01/31/2008   Insomnia 11/09/2007   Constipation 10/11/2007   Allergic rhinitis 04/12/2007   MDD (major depressive disorder), recurrent episode, moderate ([place]) 03/05/2007   [name] syndrome 03/05/2007   GERD 03/05/2007   ROSACEA 03/05/2007   NEURALGIA 03/05/2007   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[place]) 12/06/2006   Chronic interstitial cystitis 12/06/2006   Fibromyalgia 12/06/2006    ONSET DATE: 01/12/2022 (Referral date), surgery 12/29/21 and revision on 12/30/21  REFERRING DIAG:  M54.10 (ICD-10-CM) - Radiculopathy, site unspecified  M43.22 (ICD-10-CM) - Fusion of spine, cervical region    THERAPY DIAG:  Muscle weakness (generalized)  Stiffness of right knee, not elsewhere classified  Unsteadiness on feet  Other  lack of coordination  Other disturbances of skin sensation  Pain in left arm  Radiculopathy, unspecified spinal region  Rationale for Evaluation and Treatment Rehabilitation  SUBJECTIVE:   SUBJECTIVE STATEMENT: Pt reports doing well and denies any changes.  Pt accompanied by: self  PERTINENT HISTORY:  Chronic pain, [name] syndrome, GERD, cervical radiculopathy, fibromyalgia, scleroderma, herniated lumbar disc L4/L5, DDD, Partial right knee arthroplasty, scoliosis, Lt THA, interstitial cystitis From rehab admit note:  "ACDF 2004 C5-6 and C6-7 and 2017 C3-4 and C4-5, left total hip arthroplasty 2020, scleroderma.  Presented 12/29/2021 with noted history of fall 10/29/2021 suffering a C5-6 fracture subluxation/cervical kyphosis with persistent neck pain and initial conservative care with failed medical management.  Underwent 3 stage anterior posterior fusion C5-6 corpectomy, posterior fusion C3-7, ACDF C3-7 12/29/2021.  Postop she developed left hand weakness consistent with a C8 radiculopathy as well as small amount of epidural fluid at C5-6.  Cervical CT demonstrated that the left T1 pedicle screw was a bit low and into the neural foramen.  Patient underwent revision of posterior cervical instrumentation 12/30/2021 per Dr. Newman Pies.  Placed in a cervical hard collar when out of bed."   PRECAUTIONS: Cervical and Fall-cervical hard collar when OOB, NO shoulder ROM > 90* until further clarification from MD, no lifting    PAIN:  Are you having pain? Yes: NPRS scale: 3/10 Pain location: Neck and Lt arm Pain description: Lt tingling/numb and achy from shoulder to hand, throbbing in Lt shoulder blade Aggravating factors: dressing, exercise Relieving factors: rest, elevation, meds   PATIENT GOALS get Rt arm a little stronger, writing, and get Lt arm better and stronger  OBJECTIVE:   HAND DOMINANCE: Right   TODAY'S TREATMENT:  02/04/22  Yellow theraputty - Access Code: [email or web address] URL:  [email or web address] Date: 02/04/2022 Prepared by: Waldo Laine  Exercises - Putty Squeezes  - 1 x daily - 7 x weekly - 3 sets - 10 reps - Rolling Putty on Table  - 1 x daily - 7 x weekly - 3 sets - 10 reps - Thumb Opposition with Putty  - 1 x daily - 7 x weekly - 3 sets - 10 reps - Seated Finger MP Flexion with Putty  - 1 x daily - 7 x weekly - 3 sets - 10 reps  Pennies - stacking and in hand manipulation with pennies with LUE Handwriting with RUE without AD And without pencil grips. Pt reports handwriting is almost to baseline with increased legibility and accuracy.  Small Peg Board with LUE with one at a time for placing pegs into board. Pt removed with in hand manipulation. Pt copied pattern with 100% accuracy. Pt req'd min cues and completed with min difficulty and drops.  Grooved Pegs with LUE for increased coordination. Pt placed pegs with one at a time and removed with one at a time. Pt completed with min difficulty and min drops.     HOME EXERCISE PROGRAM: 02/04/22 - yellow theraputty Access Code: [email or web address]    GOALS: Goals reviewed with patient? Yes  SHORT TERM GOALS: Target date: 03/05/22  Independent w/ HEP for bilateral hand coordination and strength (more focus on LUE)  Baseline: Goal status: IN PROGRESS  2.  Pt to write short paragraph maintaining 90% or greater legibility Baseline:  Goal status: IN PROGRESS  3.  Pt to cut food mod I w/ AE prn Baseline:  Goal status: IN PROGRESS reports cutting up food independently this week  4.  Pt to perform bathing w/ direct supervision only using AE prn Baseline:  Goal status: IN PROGRESS pt reports still needing some assistance with set up from husband  5.  Pt to make simple snack/sandwich w/ supervision Baseline:  Goal status: INITIAL   LONG TERM GOALS: Target date: 05/05/22  Independent w/ updated BUE strengthening HEP  Baseline:  Goal status: INITIAL  2.  Pt to improve coordination  bilaterally as evidenced by performing 9 hole peg test in 23 sec or less Rt hand, and 27 sec or less Lt hand Baseline: Rt = 26.78 sec, Lt = 31.28 sec Goal status: INITIAL  3.  Pt to improve grip strength by 5 lbs Rt hand, by 10 lbs Lt hand Baseline: Rt = 46 lbs, Lt = 29 lbs Goal status: INITIAL  4.  Pt to return to simple stovetop cooking with supervision Baseline:  Goal status: INITIAL  5.  Pt to perform light IADLS (laundry, making bed, washing dishes) at mod I level Baseline:  Goal status: INITIAL   ASSESSMENT:  CLINICAL IMPRESSION: Patient has verbalized understanding of goals and is in agreement.   PERFORMANCE DEFICITS in functional skills including ADLs, IADLs, coordination, dexterity, sensation, ROM, strength, pain, FMC, mobility, balance, body mechanics, endurance, decreased knowledge of precautions, decreased knowledge of use of DME, and UE functional use.   IMPAIRMENTS are limiting patient from ADLs, IADLs, leisure, and social participation.   COMORBIDITIES has co-morbidities such as scleroderma, Raynauds, scoliosis, OA  that affects occupational performance. Patient will benefit from skilled OT to address above impairments and improve overall function.  MODIFICATION OR ASSISTANCE TO COMPLETE EVALUATION: Min-Moderate modification of tasks or assist with assess necessary to complete an evaluation.  OT OCCUPATIONAL PROFILE AND HISTORY: Detailed assessment: Review of records and additional review of physical, cognitive, psychosocial history related to current functional performance.  CLINICAL DECISION MAKING: Moderate - several treatment options, min-mod task modification necessary  REHAB POTENTIAL: Good  EVALUATION COMPLEXITY: Moderate    PLAN: OT FREQUENCY: 2x/week  OT DURATION: 12 weeks (anticipate only 8-10 weeks needed)   PLANNED INTERVENTIONS: self care/ADL training, therapeutic exercise, therapeutic activity, neuromuscular re-education, passive range of  motion, functional mobility training, splinting, paraffin, fluidotherapy, moist heat, cryotherapy, patient/family education, energy conservation, coping strategies training, DME and/or AE instructions, and Re-evaluation  RECOMMENDED OTHER SERVICES: NONE  CONSULTED AND AGREED WITH PLAN OF CARE: Patient  PLAN FOR NEXT SESSION: see how putty is going, coordination BUE and grip strengthening, check on clarification of precautions   Zachery Conch, OT 02/04/2022, 2:05 PM

## 2022-02-08 ENCOUNTER — Other Ambulatory Visit: Payer: Self-pay | Admitting: Gastroenterology

## 2022-02-09 ENCOUNTER — Encounter: Payer: Medicare Other | Admitting: Occupational Therapy

## 2022-02-09 ENCOUNTER — Ambulatory Visit: Payer: Medicare Other | Admitting: Physical Therapy

## 2022-02-09 ENCOUNTER — Ambulatory Visit: Payer: Medicare Other | Admitting: Occupational Therapy

## 2022-02-09 ENCOUNTER — Encounter: Payer: Self-pay | Admitting: Occupational Therapy

## 2022-02-09 ENCOUNTER — Encounter: Payer: Self-pay | Admitting: Physical Therapy

## 2022-02-09 DIAGNOSIS — M6281 Muscle weakness (generalized): Secondary | ICD-10-CM

## 2022-02-09 DIAGNOSIS — R2689 Other abnormalities of gait and mobility: Secondary | ICD-10-CM | POA: Diagnosis not present

## 2022-02-09 DIAGNOSIS — R278 Other lack of coordination: Secondary | ICD-10-CM

## 2022-02-09 DIAGNOSIS — R2681 Unsteadiness on feet: Secondary | ICD-10-CM

## 2022-02-09 DIAGNOSIS — M79602 Pain in left arm: Secondary | ICD-10-CM

## 2022-02-09 DIAGNOSIS — M25661 Stiffness of right knee, not elsewhere classified: Secondary | ICD-10-CM

## 2022-02-09 DIAGNOSIS — R208 Other disturbances of skin sensation: Secondary | ICD-10-CM

## 2022-02-09 NOTE — Therapy (Signed)
OUTPATIENT PHYSICAL THERAPY NEURO TREATMENT   Patient Name: Rachel Duran MRN: [id number] DOB:October 05, 1955, 66 y.o., female Today's Date: 02/09/2022   PCP: Ria Bush, MD REFERRING PROVIDER: Courtney Heys, MD    PT End of Session - 02/09/22 0935     Visit Number 4    Number of Visits 19   18 + eval   Date for PT Re-Evaluation 04/29/22   pushed out for scheduling   Authorization Type [employer]IC[employer]    Progress Note Due on Visit 10    PT Start Time 0933    PT Stop Time 1014    PT Time Calculation (min) 41 min    Equipment Utilized During Treatment Gait belt;Cervical collar    Activity Tolerance Patient tolerated treatment well    Behavior During Therapy WFL for tasks assessed/performed                Past Medical History:  Diagnosis Date   Abdominal pain last 4 months   and nausea also   Allergy    Anemia    history of   Anxiety    Bipolar disorder ([place])    atpical bipolar disorder   Cervical disc disease limited rom turning to left   hx. C6- C7 -hx. past fusion(bone graft used)   Cholecystitis    Chronic pain    DDD (degenerative disc disease), lumbar 09/2015   dextroscoliosis with multilevel DDD and facet arthrosis most notable for R foraminal disc protrusion L4/5 producing severe R neural foraminal stenosis abutting R L4 nerve root, moderate spinal canal and mild lat recesss and R neural foraminal stenosis L3/4 (MRI)   Depression    bipolar depression   Disorders of porphyrin metabolism    Felon of finger of left hand 11/10/2016   Fibromyalgia    GERD (gastroesophageal reflux disease)    Headache    occasionally   Internal hemorrhoids    Interstitial cystitis 06/06/2012   hx.   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 (Dr Baird Cancer retinologist)   Tuberculosis    Past Surgical History:  Procedure Laterality Date   ANTERIOR CERVICAL DECOMP/DISCECTOMY FUSION  2004   C5/6, C6/7   ANTERIOR CERVICAL DECOMP/DISCECTOMY FUSION  02/2016   C3/4, C4/5 with plating [name])   ANTERIOR CERVICAL DECOMP/DISCECTOMY FUSION  12/29/2021   Procedure: ANTERIOR CERVICAL DECOMPRESSION/ DISCECTOMY FUSION CERVICAL THREE - SEVEN;  Surgeon: Newman Pies, MD;  Location: [facility];  Service: Neurosurgery;;   ANTERIOR CERVICAL DECOMPRESSION/DISCECTOMY FUSION 4 LEVELS N/A 12/29/2021   Procedure: CERVICAL FIVE-SIX CORPECTOMY,;  Surgeon: Newman Pies, MD;  Location: [facility];  Service: Neurosurgery;  Laterality: N/A;   AUGMENTATION MAMMAPLASTY Bilateral 03/25/2010   BREAST ENHANCEMENT SURGERY  2010   BREAST IMPLANT EXCHANGE  10/2014   exchange saline implants, B mastopexy/capsulorraphy ([facility])   BUNIONECTOMY Bilateral yrs ago   [place]   x 1   CHOLECYSTECTOMY  06/11/2012   Procedure: LAPAROSCOPIC CHOLECYSTECTOMY WITH INTRAOPERATIVE CHOLANGIOGRAM;  Surgeon:  [name], MD,FACS;  Location: [facility];  Service: General;  Laterality: N/A;   COLONOSCOPY  02/2018   done for positive cologuard - WNL, rpt 10 yrs Fuller Plan)   CYSTOSCOPY     ERCP  05/22/2012   Procedure: ENDOSCOPIC RETROGRADE CHOLANGIOPANCREATOGRAPHY (ERCP);  Surgeon: [name], MD,FACG;  Location: [facility];  Service: Endoscopy;  Laterality: N/A;   ERCP N/A 09/17/2013   Procedure: ENDOSCOPIC RETROGRADE CHOLANGIOPANCREATOGRAPHY (ERCP);  Surgeon: [name], MD;  Location: [facility];  Service: Endoscopy;  Laterality: N/A;   ERCP N/A 09/27/2018   Procedure: ENDOSCOPIC RETROGRADE CHOLANGIOPANCREATOGRAPHY (ERCP);   Surgeon: [name], MD;  Location: [facility];  Service: Endoscopy;  Laterality: N/A;   ESOPHAGOGASTRODUODENOSCOPY  09/2016   WNL. esophagus dilated [facility] Plan)   HEMORRHOID BANDING  09/23/2013   --Dr. Greer Pickerel   HERNIA REPAIR     inguinal   HYSTEROSCOPY W/ ENDOMETRIAL ABLATION     KNEE ARTHROSCOPY Right 04/06/2021   Procedure: RIGHT KNEE ARTHROSCOPY WITH DEBRIDEMENT;  Surgeon: [name], MD;  Location: [facility];  Service: Orthopedics;  Laterality: Right;   NASAL SINUS SURGERY     x5   PARTIAL KNEE ARTHROPLASTY Right 06/01/2020   Procedure: Right knee patellofemoral replacement;  Surgeon: [name], MD;  Location: [facility];  Service: Orthopedics;  Laterality: Right;   POSTERIOR CERVICAL FUSION/FORAMINOTOMY N/A 12/29/2021   Procedure: POSTERIOR CERVICAL FUSION CERVICAL THREE-SEVEN;  Surgeon: Newman Pies, MD;  Location: [facility];  Service: Neurosurgery;  Laterality: N/A;   REMOVAL OF STONES  09/27/2018   Procedure: REMOVAL OF STONES;  Surgeon: [name], MD;  [facility];  Service: Endoscopy;;   SPHINCTEROTOMY  09/27/2018   Procedure: SPHINCTEROTOMY;  Surgeon: [name], MD;  [facility];  Service: Endoscopy;;   TONSILLECTOMY     removed as a child   TOTAL HIP ARTHROPLASTY Left 03/12/2019   Procedure: LEFT TOTAL HIP ARTHROPLASTY ANTERIOR APPROACH;  [name], [name], MD)   UPPER GASTROINTESTINAL ENDOSCOPY     Patient Active Problem List   Diagnosis Date Noted   Fusion of spine, cervical region    Anemia    Primary hypertension    Radiculopathy 01/07/2022   Cervical radiculopathy 12/29/2021   C6 cervical fracture ([place]) 12/29/2021   C5 cervical fracture ([place]) 12/21/2021   Advanced directives, counseling/discussion 09/23/2021   Strep throat 08/18/2021   Swelling of right index finger 07/14/2021   Synovitis of right knee    Scoliosis 01/13/2021   Osteopenia 07/02/2020   Status  post left knee surgery 06/01/2020   Pulmonary nodule 05/27/2020   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   Chronic patellofemoral pain of right knee 05/05/2020   Positive ANA (antinuclear antibody) 06/05/2019   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   Unilateral primary osteoarthritis, left hip 01/28/2019   Pelvic pain 12/12/2018   Common bile duct (CBD) obstruction    Venous insufficiency of left lower extremity 07/04/2018   Dyslipidemia 09/22/2017   DDD (degenerative disc disease), cervical 04/03/2017   DDD (degenerative disc disease), lumbar 04/03/2017   Onychomycosis 03/21/2017   Dysphagia 10/18/2016   Encounter for chronic pain management 10/18/2016   Biliary stasis 09/26/2016   HNP (herniated nucleus pulposus), lumbar 09/23/2015   Iron deficiency 07/30/2015   Health maintenance examination 06/15/2015   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   Clavicle enlargement 12/13/2014   Prediabetes 12/13/2014   Plantar fasciitis, bilateral 09/11/2014   Medicare annual wellness visit, subsequent  06/10/2014   Abnormal thyroid function test 06/10/2014   Chronic pain syndrome 06/10/2014   CFS (chronic fatigue syndrome) 04/08/2014   Postmenopausal atrophic vaginitis 10/19/2012   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   Cervical disc disorder with radiculopathy of cervical region 05/28/2010   APHTHOUS ULCERS 01/31/2008   Insomnia 11/09/2007   Constipation 10/11/2007   Allergic rhinitis 04/12/2007   MDD (major depressive disorder), recurrent episode, moderate ([place]) 03/05/2007   [name] syndrome 03/05/2007   GERD 03/05/2007   ROSACEA 03/05/2007   NEURALGIA 03/05/2007   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[place]) 12/06/2006   Chronic interstitial cystitis 12/06/2006   Fibromyalgia 12/06/2006    ONSET DATE: 01/12/2022 (referral)  REFERRING DIAG: M54.10 (ICD-10-CM) - Radiculopathy, site unspecified M43.22 (ICD-10-CM) - Fusion of spine, cervical region   THERAPY DIAG:   Muscle weakness (generalized)  Other abnormalities of gait and mobility  Rationale for Evaluation and Treatment Rehabilitation   PERTINENT HISTORY: Chronic pain, [name] syndrome, GERD, cervical radiculopathy, fibromyalgia, hereditary macular degeneration (she has most trouble with peripheral vision), herniated lumbar disc L4/L5, DDD, Partial right knee arthroplasty, scoliosis  From rehab admit note:  "ACDF 2004 C5-6 and C6-7 and 2017 C3-4 and C4-5, left total hip arthroplasty 2020, scleroderma.  Presented 12/29/2021 with noted history of fall 10/29/2021 suffering a C5-6 fracture subluxation/cervical kyphosis with persistent neck pain and initial conservative care with failed medical management.  Underwent 3 stage anterior posterior fusion C5-6 corpectomy, posterior fusion C3-7, ACDF C3-7 12/29/2021.  Postop she developed left hand weakness consistent with a C8 radiculopathy as well as small amount of epidural fluid at C5-6.  Cervical CT demonstrated that the left T1 pedicle screw was a bit low and into the neural foramen.  Patient underwent revision of posterior cervical instrumentation 12/30/2021 per Dr. Newman Pies.  Placed in a cervical hard collar when out of bed."    PRECAUTIONS: Cervical and Fall-cervical hard collar when OOB   SUBJECTIVE: No falls. Was nauseated the entire day after last session from doing the balance exercises. Wants to no work on balance.                                                                                                                                                                                             PAIN:  Are you having pain? Yes: NPRS scale: 2/10 Pain location: left side of neck into left arm Pain description: achy, tingling, burning, numb, weak Aggravating factors: Moving Relieving factors: resting, elevating    TODAY'S TREATMENT:   SELF CARE: Discussed continuing to address balance on a scaled back method, keeping eyes open on  compliant surfaces/decreased vestibular  imput, so to decrease pt's risk for falling. Pt agreeable to working on balance on a slow, gradual process.   BALANCE/NMR: Red/blue mats next to counter top: side stepping left<>right, high knee marching forward/backward and then tandem gait forward/backwards for 4 laps each/each way with single UE support, min guard to min assist for balance with cues on posture, form and technique.  Standing across red foam beam with mirror feedback: alternating forward stepping to floor/back onto beam, then alternating backward stepping to floor/back onto beam for 10 reps each side/each way with min guard to min assist for balance with cues on step length/height to clear beam surface.  Seated with feet on airex: feet in parallel position for 10 reps, then in modified tandem position for 5 reps each foot forward. Min guard assist for safety.  Margarette Canada  Scifit LE/UE's level 2.5 x 8 minutes with goal >/= 50 steps per minute for strengthening and activity tolerance.    GAIT: Gait pattern: step through pattern and decreased stride length Distance walked: 230 x1, plus around clinic between tasks with no device, CGA Assistive device utilized: Walker - 2 wheeled, cervical collar, and None Level of assistance: SBA and CGA Comments: mild ataxia at times with no device. Cues needed for increased base of support and step length.  One episode of knee buckling with pt self correcting with distance gait.       PATIENT EDUCATION: Education details: continue with current HEP Person educated: Patient Education method: Explanation Education comprehension: verbalized understanding   HOME EXERCISE PROGRAM: Access Code: [email or web address] URL: [email or web address] Date: 02/02/2022 Prepared by: Elease Etienne  Exercises - Sit to Stand with Arms Crossed  - 1 x daily - 4-5 x weekly - 2 sets - 10 reps - Standing Near Stance in Corner with Eyes Closed  - 1 x daily  - 4-5 x weekly - 1 sets - 3-4 reps - 30 seconds hold - Tandem Stance in Corner  - 1 x daily - 5 x weekly - 1 sets - 3-4 reps - 30 seconds hold - Standing Single Leg Stance with Counter Support  - 1 x daily - 5 x weekly - 1 sets - 3-4 reps - 30 seconds hold    GOALS: Goals reviewed with patient? Yes  SHORT TERM GOALS: Target date: 02/18/2022  Pt will be independent with strength and balance HEP with supervision from family as needed. Baseline:  To be established. Goal status: INITIAL  2.  Pt will decrease 5xSTS to </=17 seconds w/o UE support in order to demonstrate decreased risk for falls and improved functional bilateral LE strength and power. Baseline: 20.81sec w/ hands on knees Goal status: INITIAL  3.  Pt will demonstrate TUG of </=12 seconds in order to decrease risk of falls and improve functional mobility using LRAD. Baseline: 14.20 sec w/ RW Goal status: INITIAL  4.  Pt will increase BERG balance score to 40/56 to demonstrate improved static balance. Baseline: 36/56 Goal status: INITIAL  5.  Pt will ambulate >/= 1000' feet w/ LRAD on 6MWT to demonstrate improved functional endurance for home and community participation. Baseline: 900' w/ RW Goal status: INITIAL  6.  Pt will report no pain at rest and during static standing activities to promote improved activity tolerance. Baseline: 3/10 Goal status: INITIAL  LONG TERM GOALS: Target date: 03/18/2022 (8 weeks); 04/15/2022 (12 weeks)  Pt will report return to neighborhood walking 3 days per week to promote return to prior level of function. Baseline:  Pt not walking in neighborhood due to balance. Goal status: INITIAL  2.  Pt will decrease 5xSTS to </=13 seconds w/o UE support in order to demonstrate decreased risk for falls and improved functional bilateral LE strength and power. Baseline: 20.81sec w/ hands on knees Goal status: INITIAL  3.  Pt will increase BERG balance score to 45/56 to demonstrate improved static  balance. Baseline: 36/56 Goal status: INITIAL  4.  Pt will ambulate >/= 1200 feet w/ LRAD on 6MWT to demonstrate improved functional endurance for home and community participation. Baseline:  900' w/ RW Goal status: INITIAL  5.  Pt will ambulate >/=1000 feet on various indoor and outdoor surfaces with LRAD and modified independent level of assist to promote household and community access. Baseline: No distance formally calculated on eval. Goal status: INITIAL  6.  Pt will negotiate 14 stairs w/o UE support and reciprocal steps up and down independently to promote improved safety and independence within home environment. Baseline:  Pt demonstrates hesitancy with descending stairs. Goal status: INITIAL  ASSESSMENT:  CLINICAL IMPRESSION: Today's skilled session continued to focus on strengthening, gait with no AD and balance training. No dizziness or nausea reported with session today with balance tasks performed today. The pt is making progress toward goals and should benefit from continued PT to progress toward unmet goals.   OBJECTIVE IMPAIRMENTS Abnormal gait, decreased activity tolerance, decreased balance, decreased endurance, decreased mobility, difficulty walking, decreased strength, impaired UE functional use, and pain.   ACTIVITY LIMITATIONS carrying, lifting, bending, squatting, and stairs  PARTICIPATION LIMITATIONS: driving, community activity, and yard work  PERSONAL FACTORS Age, Fitness, Past/current experiences, Time since onset of injury/illness/exacerbation, and 1-2 comorbidities: chronic pain and DDD  are also affecting patient's functional outcome.   REHAB POTENTIAL: Good  CLINICAL DECISION MAKING: Evolving/moderate complexity  EVALUATION COMPLEXITY: Moderate  PLAN: PT FREQUENCY:  2x/week for 6 weeks followed by 1x/week for 6 weeks  PT DURATION: 12 weeks (cert 14 weeks - 36/12/2945)  PLANNED INTERVENTIONS: Therapeutic exercises, Therapeutic activity,  Neuromuscular re-education, Balance training, Gait training, Patient/Family education, Joint mobilization, Stair training, Vestibular training, DME instructions, Cryotherapy, Moist heat, Manual therapy, and Re-evaluation  PLAN FOR NEXT SESSION:   Update HEP prn - BLE strengthening and dynamic balance.  Gait training w/ no AD.  Descending stairs.  Pt will be appropriate for FGA in future-maybe at [facility] assessment will add.  SLS.    Willow Ora, PTA, [place] [address], [place], [place] [address] [phone] 02/09/22, 8:00 PM

## 2022-02-09 NOTE — Therapy (Signed)
OUTPATIENT OCCUPATIONAL THERAPY NEURO TREATMENT  Patient Name: Rachel Duran MRN: [id number] DOB:November 13, 1955, 66 y.o., female Today's Date: 02/09/2022  PCP: Ria Bush, MD  REFERRING PROVIDER: Courtney Heys, MD    OT End of Session - 02/09/22 1015     Visit Number 3    Number of Visits 17    Date for OT Re-Evaluation 05/05/22    Authorization Type [employer] MCR    OT Start Time 1015    OT Stop Time 1100    OT Time Calculation (min) 45 min    Activity Tolerance Patient tolerated treatment well    Behavior During Therapy WFL for tasks assessed/performed             Past Medical History:  Diagnosis Date   Abdominal pain last 4 months   and nausea also   Allergy    Anemia    history of   Anxiety    Bipolar disorder ([place])    atpical bipolar disorder   Cervical disc disease limited rom turning to left   hx. C6- C7 -hx. past fusion(bone graft used)   Cholecystitis    Chronic pain    DDD (degenerative disc disease), lumbar 09/2015   dextroscoliosis with multilevel DDD and facet arthrosis most notable for R foraminal disc protrusion L4/5 producing severe R neural foraminal stenosis abutting R L4 nerve root, moderate spinal canal and mild lat recesss and R neural foraminal stenosis L3/4 (MRI)   Depression    bipolar depression   Disorders of porphyrin metabolism    Felon of finger of left hand 11/10/2016   Fibromyalgia    GERD (gastroesophageal reflux disease)    Headache    occasionally   Internal hemorrhoids    Interstitial cystitis 06/06/2012   hx.   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 (Dr Baird Cancer retinologist)   Tuberculosis    Past Surgical History:  Procedure Laterality Date   ANTERIOR CERVICAL DECOMP/DISCECTOMY FUSION  2004   C5/6, C6/7   ANTERIOR CERVICAL DECOMP/DISCECTOMY FUSION  02/2016   C3/4, C4/5 with plating [name])   ANTERIOR CERVICAL DECOMP/DISCECTOMY FUSION  12/29/2021   Procedure: ANTERIOR CERVICAL DECOMPRESSION/ DISCECTOMY FUSION CERVICAL THREE - SEVEN;  Surgeon: Newman Pies, MD;  Location: [facility];  Service: Neurosurgery;;   ANTERIOR CERVICAL DECOMPRESSION/DISCECTOMY FUSION 4 LEVELS N/A 12/29/2021   Procedure: CERVICAL FIVE-SIX CORPECTOMY,;  Surgeon: Newman Pies, MD;  Location: [facility];  Service: Neurosurgery;  Laterality: N/A;   AUGMENTATION MAMMAPLASTY Bilateral 03/25/2010   BREAST ENHANCEMENT SURGERY  2010   BREAST IMPLANT EXCHANGE  10/2014   exchange saline implants, B mastopexy/capsulorraphy ([facility])   BUNIONECTOMY Bilateral yrs ago   [place]   x 1   CHOLECYSTECTOMY  06/11/2012   Procedure: LAPAROSCOPIC CHOLECYSTECTOMY WITH INTRAOPERATIVE CHOLANGIOGRAM;  Surgeon: [name], MD,FACS;  Location: [facility];  Service: General;  Laterality: N/A;   COLONOSCOPY  02/2018   done for positive cologuard - WNL, rpt 10 yrs [facility] Plan)  CYSTOSCOPY     ERCP  05/22/2012   Procedure: ENDOSCOPIC RETROGRADE CHOLANGIOPANCREATOGRAPHY (ERCP);  Surgeon: [name], MD,FACG;  Location: [facility];  Service: Endoscopy;  Laterality: N/A;   ERCP N/A 09/17/2013   Procedure: ENDOSCOPIC RETROGRADE CHOLANGIOPANCREATOGRAPHY (ERCP);  Surgeon: [name], MD;  Location: [facility];  Service: Endoscopy;  Laterality: N/A;   ERCP N/A 09/27/2018   Procedure: ENDOSCOPIC RETROGRADE CHOLANGIOPANCREATOGRAPHY (ERCP);  Surgeon: [name], MD;  Location: [facility];  Service: Endoscopy;  Laterality: N/A;   ESOPHAGOGASTRODUODENOSCOPY  09/2016   WNL. esophagus dilated [facility])    HEMORRHOID BANDING  09/23/2013   --Dr. Greer Pickerel   HERNIA REPAIR     inguinal   HYSTEROSCOPY W/ ENDOMETRIAL ABLATION     KNEE ARTHROSCOPY Right 04/06/2021   Procedure: RIGHT KNEE ARTHROSCOPY WITH DEBRIDEMENT;  Surgeon: [name], MD;  Location: [facility];  Service: Orthopedics;  Laterality: Right;   NASAL SINUS SURGERY     x5   PARTIAL KNEE ARTHROPLASTY Right 06/01/2020   Procedure: Right knee patellofemoral replacement;  Surgeon: [name], MD;  Location: [facility];  Service: Orthopedics;  Laterality: Right;   POSTERIOR CERVICAL FUSION/FORAMINOTOMY N/A 12/29/2021   Procedure: POSTERIOR CERVICAL FUSION CERVICAL THREE-SEVEN;  Surgeon: Newman Pies, MD;  Location: [facility];  Service: Neurosurgery;  Laterality: N/A;   REMOVAL OF STONES  09/27/2018   Procedure: REMOVAL OF STONES;  Surgeon: [name], MD;  Location: [facility];  Service: Endoscopy;;   SPHINCTEROTOMY  09/27/2018   Procedure: SPHINCTEROTOMY;  Surgeon: [name], MD;  Location[facility];  Service: Endoscopy;;   TONSILLECTOMY     removed as a child   TOTAL HIP ARTHROPLASTY Left 03/12/2019   Procedure: LEFT TOTAL HIP ARTHROPLASTY ANTERIOR APPROACH;  [name], [name], MD)   UPPER GASTROINTESTINAL ENDOSCOPY     Patient Active Problem List   Diagnosis Date Noted   Fusion of spine, cervical region    Anemia    Primary hypertension    Radiculopathy 01/07/2022   Cervical radiculopathy 12/29/2021   C6 cervical fracture ([place]) 12/29/2021   C5 cervical fracture ([place]) 12/21/2021   Advanced directives, counseling/discussion 09/23/2021   Strep throat 08/18/2021   Swelling of right index finger 07/14/2021   Synovitis of right knee    Scoliosis 01/13/2021   Osteopenia 07/02/2020   Status post left knee surgery 06/01/2020   Pulmonary nodule 05/27/2020   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   Chronic patellofemoral pain of right knee 05/05/2020    Positive ANA (antinuclear antibody) 06/05/2019   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   Unilateral primary osteoarthritis, left hip 01/28/2019   Pelvic pain 12/12/2018   Common bile duct (CBD) obstruction    Venous insufficiency of left lower extremity 07/04/2018   Dyslipidemia 09/22/2017   DDD (degenerative disc disease), cervical 04/03/2017   DDD (degenerative disc disease), lumbar 04/03/2017   Onychomycosis 03/21/2017   Dysphagia 10/18/2016   Encounter for chronic pain management 10/18/2016   Biliary stasis 09/26/2016   HNP (herniated nucleus pulposus), lumbar 09/23/2015   Iron deficiency 07/30/2015   Health maintenance examination 06/15/2015   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   Clavicle enlargement 12/13/2014   Prediabetes 12/13/2014   Plantar fasciitis, bilateral 09/11/2014   Medicare annual wellness visit, subsequent 06/10/2014   Abnormal thyroid function test 06/10/2014   Chronic pain syndrome 06/10/2014   CFS (chronic fatigue syndrome) 04/08/2014   Postmenopausal atrophic vaginitis 10/19/2012   Positive QuantiFERON-TB Gold test  02/07/2012   Cervical disc disorder with radiculopathy of cervical region 05/28/2010   APHTHOUS ULCERS 01/31/2008   Insomnia 11/09/2007   Constipation 10/11/2007   Allergic rhinitis 04/12/2007   MDD (major depressive disorder), recurrent episode, moderate ([place]) 03/05/2007   [name] syndrome 03/05/2007   GERD 03/05/2007   ROSACEA 03/05/2007   NEURALGIA 03/05/2007   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[place]) 12/06/2006   Chronic interstitial cystitis 12/06/2006   Fibromyalgia 12/06/2006    ONSET DATE: 01/12/2022 (Referral date), surgery 12/29/21 and revision on 12/30/21  REFERRING DIAG:  M54.10 (ICD-10-CM) - Radiculopathy, site unspecified  M43.22 (ICD-10-CM) - Fusion of spine, cervical region    THERAPY DIAG:  Muscle weakness (generalized)  Stiffness of right knee, not elsewhere classified  Unsteadiness on feet  Other  lack of coordination  Other disturbances of skin sensation  Pain in left arm  Rationale for Evaluation and Treatment Rehabilitation  SUBJECTIVE:   SUBJECTIVE STATEMENT: Pt reports able to braid her granddaughter's hair with both hands.  Pt accompanied by: self  PERTINENT HISTORY:  Chronic pain, [name] syndrome, GERD, cervical radiculopathy, fibromyalgia, scleroderma, herniated lumbar disc L4/L5, DDD, Partial right knee arthroplasty, scoliosis, Lt THA, interstitial cystitis From rehab admit note:  "ACDF 2004 C5-6 and C6-7 and 2017 C3-4 and C4-5, left total hip arthroplasty 2020, scleroderma.  Presented 12/29/2021 with noted history of fall 10/29/2021 suffering a C5-6 fracture subluxation/cervical kyphosis with persistent neck pain and initial conservative care with failed medical management.  Underwent 3 stage anterior posterior fusion C5-6 corpectomy, posterior fusion C3-7, ACDF C3-7 12/29/2021.  Postop she developed left hand weakness consistent with a C8 radiculopathy as well as small amount of epidural fluid at C5-6.  Cervical CT demonstrated that the left T1 pedicle screw was a bit low and into the neural foramen.  Patient underwent revision of posterior cervical instrumentation 12/30/2021 per Dr. Newman Pies.  Placed in a cervical hard collar when out of bed."   PRECAUTIONS: Cervical and Fall-cervical hard collar when OOB, NO shoulder ROM > 90* until further clarification from MD, no lifting    PAIN:  Are you having pain? Yes: NPRS scale: 3/10 Pain location: Neck and Lt arm Pain description: Lt tingling/numb and achy from shoulder to hand, throbbing in Lt shoulder blade Aggravating factors: dressing, exercise Relieving factors: rest, elevation, meds   PATIENT GOALS get Rt arm a little stronger, writing, and get Lt arm better and stronger  OBJECTIVE:   HAND DOMINANCE: Right   TODAY'S TREATMENT:  02/09/22  Purdue Pegboard with LUE for increased coordination. Increased time  today. Resistance Clothespins 1-8# with LUE and RUE for low and mid functional reaching and sustained pinch. Pt req'd min cues for movement pattern and maintaining good positioning of LUE  and RUE with reach. No reaching over chin level d/t precautions. Handwriting with distal finger control worksheet and doing word search with writing words as finding them for increased legibility and accuracy for handwriting.    HOME EXERCISE PROGRAM: 02/04/22 - yellow theraputty Access Code: [email or web address]    GOALS: Goals reviewed with patient? Yes  SHORT TERM GOALS: Target date: 03/05/22  Independent w/ HEP for bilateral hand coordination and strength (more focus on LUE)  Baseline: Goal status: IN PROGRESS  2.  Pt to write short paragraph maintaining 90% or greater legibility Baseline:  Goal status: IN PROGRESS  3.  Pt to cut food mod I w/ AE prn Baseline:  Goal status: IN PROGRESS reports cutting up food independently this week  4.  Pt  to perform bathing w/ direct supervision only using AE prn Baseline:  Goal status: IN PROGRESS pt reports still needing some assistance with set up from husband  5.  Pt to make simple snack/sandwich w/ supervision Baseline:  Goal status: IN PROGRESS - reports making spaghetti and other meals    LONG TERM GOALS: Target date: 05/05/22  Independent w/ updated BUE strengthening HEP  Baseline:  Goal status: INITIAL  2.  Pt to improve coordination bilaterally as evidenced by performing 9 hole peg test in 23 sec or less Rt hand, and 27 sec or less Lt hand Baseline: Rt = 26.78 sec, Lt = 31.28 sec Goal status: INITIAL  3.  Pt to improve grip strength by 5 lbs Rt hand, by 10 lbs Lt hand Baseline: Rt = 46 lbs, Lt = 29 lbs Goal status: INITIAL  4.  Pt to return to simple stovetop cooking with supervision Baseline:  Goal status: IN PROGRESS reports making spaghetti and other meals   5.  Pt to perform light IADLS (laundry, making bed, washing dishes) at mod I  level Baseline:  Goal status: INITIAL   ASSESSMENT:  CLINICAL IMPRESSION: Pt progressing with overall BUE use.   PERFORMANCE DEFICITS in functional skills including ADLs, IADLs, coordination, dexterity, sensation, ROM, strength, pain, FMC, mobility, balance, body mechanics, endurance, decreased knowledge of precautions, decreased knowledge of use of DME, and UE functional use.   IMPAIRMENTS are limiting patient from ADLs, IADLs, leisure, and social participation.   COMORBIDITIES has co-morbidities such as scleroderma, Raynauds, scoliosis, OA  that affects occupational performance. Patient will benefit from skilled OT to address above impairments and improve overall function.  MODIFICATION OR ASSISTANCE TO COMPLETE EVALUATION: Min-Moderate modification of tasks or assist with assess necessary to complete an evaluation.  OT OCCUPATIONAL PROFILE AND HISTORY: Detailed assessment: Review of records and additional review of physical, cognitive, psychosocial history related to current functional performance.  CLINICAL DECISION MAKING: Moderate - several treatment options, min-mod task modification necessary  REHAB POTENTIAL: Good  EVALUATION COMPLEXITY: Moderate    PLAN: OT FREQUENCY: 2x/week  OT DURATION: 12 weeks (anticipate only 8-10 weeks needed)   PLANNED INTERVENTIONS: self care/ADL training, therapeutic exercise, therapeutic activity, neuromuscular re-education, passive range of motion, functional mobility training, splinting, paraffin, fluidotherapy, moist heat, cryotherapy, patient/family education, energy conservation, coping strategies training, DME and/or AE instructions, and Re-evaluation  RECOMMENDED OTHER SERVICES: NONE  CONSULTED AND AGREED WITH PLAN OF CARE: Patient  PLAN FOR NEXT SESSION: coordination BUE and grip strengthening, check on clarification of precautions   Zachery Conch, OT 02/09/2022, 10:56 AM

## 2022-02-11 ENCOUNTER — Encounter: Payer: Self-pay | Admitting: Occupational Therapy

## 2022-02-11 ENCOUNTER — Ambulatory Visit: Payer: Medicare Other | Admitting: Physical Therapy

## 2022-02-11 ENCOUNTER — Encounter: Payer: Self-pay | Admitting: Physical Therapy

## 2022-02-11 ENCOUNTER — Ambulatory Visit: Payer: Medicare Other | Admitting: Occupational Therapy

## 2022-02-11 DIAGNOSIS — R2689 Other abnormalities of gait and mobility: Secondary | ICD-10-CM | POA: Diagnosis not present

## 2022-02-11 DIAGNOSIS — M6281 Muscle weakness (generalized): Secondary | ICD-10-CM

## 2022-02-11 DIAGNOSIS — R2681 Unsteadiness on feet: Secondary | ICD-10-CM

## 2022-02-11 DIAGNOSIS — R278 Other lack of coordination: Secondary | ICD-10-CM

## 2022-02-11 DIAGNOSIS — R208 Other disturbances of skin sensation: Secondary | ICD-10-CM

## 2022-02-11 DIAGNOSIS — M79602 Pain in left arm: Secondary | ICD-10-CM

## 2022-02-11 NOTE — Therapy (Signed)
OUTPATIENT OCCUPATIONAL THERAPY NEURO TREATMENT  Patient Name: Rachel Duran MRN: [id number] DOB:04-17-1956, 66 y.o., female Today's Date: 02/11/2022  PCP: Ria Bush, MD  REFERRING PROVIDER: Courtney Heys, MD    OT End of Session - 02/11/22 1230     Visit Number 4    Number of Visits 17    Date for OT Re-Evaluation 05/05/22    Authorization Type [employer] MCR    OT Start Time 1230    OT Stop Time 1315    OT Time Calculation (min) 45 min    Activity Tolerance Patient tolerated treatment well    Behavior During Therapy WFL for tasks assessed/performed             Past Medical History:  Diagnosis Date   Abdominal pain last 4 months   and nausea also   Allergy    Anemia    history of   Anxiety    Bipolar disorder ([place])    atpical bipolar disorder   Cervical disc disease limited rom turning to left   hx. C6- C7 -hx. past fusion(bone graft used)   Cholecystitis    Chronic pain    DDD (degenerative disc disease), lumbar 09/2015   dextroscoliosis with multilevel DDD and facet arthrosis most notable for R foraminal disc protrusion L4/5 producing severe R neural foraminal stenosis abutting R L4 nerve root, moderate spinal canal and mild lat recesss and R neural foraminal stenosis L3/4 (MRI)   Depression    bipolar depression   Disorders of porphyrin metabolism    Felon of finger of left hand 11/10/2016   Fibromyalgia    GERD (gastroesophageal reflux disease)    Headache    occasionally   Internal hemorrhoids    Interstitial cystitis 06/06/2012   hx.   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 (Dr Baird Cancer retinologist)   Tuberculosis    Past Surgical History:  Procedure Laterality Date   ANTERIOR CERVICAL DECOMP/DISCECTOMY FUSION  2004   C5/6, C6/7   ANTERIOR CERVICAL DECOMP/DISCECTOMY FUSION  02/2016   C3/4, C4/5 with plating [name])   ANTERIOR CERVICAL DECOMP/DISCECTOMY FUSION  12/29/2021   Procedure: ANTERIOR CERVICAL DECOMPRESSION/ DISCECTOMY FUSION CERVICAL THREE - SEVEN;  Surgeon: Newman Pies, MD;  Location: [place];  Service: Neurosurgery;;   ANTERIOR CERVICAL DECOMPRESSION/DISCECTOMY FUSION 4 LEVELS N/A 12/29/2021   Procedure: CERVICAL FIVE-SIX CORPECTOMY,;  Surgeon: Newman Pies, MD;  Location: [place];  Service: Neurosurgery;  Laterality: N/A;   AUGMENTATION MAMMAPLASTY Bilateral 03/25/2010   BREAST ENHANCEMENT SURGERY  2010   BREAST IMPLANT EXCHANGE  10/2014   exchange saline implants, B mastopexy/capsulorraphy ([facility])   BUNIONECTOMY Bilateral yrs ago   Adrian   x 1   CHOLECYSTECTOMY  06/11/2012   Procedure: LAPAROSCOPIC CHOLECYSTECTOMY WITH INTRAOPERATIVE CHOLANGIOGRAM;  Surgeon: [name], MD,FACS;  Location: [facility];  Service: General;  Laterality: N/A;   COLONOSCOPY  02/2018   done for positive cologuard - WNL, rpt 10 yrs Fuller Plan)  CYSTOSCOPY     ERCP  05/22/2012   Procedure: ENDOSCOPIC RETROGRADE CHOLANGIOPANCREATOGRAPHY (ERCP);  Surgeon: [name], MD,FACG;  Location[facility];  Service: Endoscopy;  Laterality: N/A;   ERCP N/A 09/17/2013   Procedure: ENDOSCOPIC RETROGRADE CHOLANGIOPANCREATOGRAPHY (ERCP);  Surgeon: [name], MD;  Location: [facility];  Service: Endoscopy;  Laterality: N/A;   ERCP N/A 09/27/2018   Procedure: ENDOSCOPIC RETROGRADE CHOLANGIOPANCREATOGRAPHY (ERCP);  Surgeon: [name], MD;  Location: [facility];  Service: Endoscopy;  Laterality: N/A;   ESOPHAGOGASTRODUODENOSCOPY  09/2016   WNL. esophagus dilated Fuller Plan)    HEMORRHOID BANDING  09/23/2013   --Dr. Greer Pickerel   HERNIA REPAIR     inguinal   HYSTEROSCOPY W/ ENDOMETRIAL ABLATION     KNEE ARTHROSCOPY Right 04/06/2021   Procedure: RIGHT KNEE ARTHROSCOPY WITH DEBRIDEMENT;  Surgeon: [name], MD;  Location: [place];  Service: Orthopedics;  Laterality: Right;   NASAL SINUS SURGERY     x5   PARTIAL KNEE ARTHROPLASTY Right 06/01/2020   Procedure: Right knee patellofemoral replacement;  Surgeon: [name], MD;  Location: [place];  Service: Orthopedics;  Laterality: Right;   POSTERIOR CERVICAL FUSION/FORAMINOTOMY N/A 12/29/2021   Procedure: POSTERIOR CERVICAL FUSION CERVICAL THREE-SEVEN;  Surgeon: Newman Pies, MD;  Location: [place];  Service: Neurosurgery;  Laterality: N/A;   REMOVAL OF STONES  09/27/2018   Procedure: REMOVAL OF STONES;  Surgeon: [name], MD;  Location: [facility];  Service: Endoscopy;;   SPHINCTEROTOMY  09/27/2018   Procedure: SPHINCTEROTOMY;  Surgeon: [name], MD;  Location[facility];  Service: Endoscopy;;   TONSILLECTOMY     removed as a child   TOTAL HIP ARTHROPLASTY Left 03/12/2019   Procedure: LEFT TOTAL HIP ARTHROPLASTY ANTERIOR APPROACH;  [name], [name], MD)   UPPER GASTROINTESTINAL ENDOSCOPY     Patient Active Problem List   Diagnosis Date Noted   Fusion of spine, cervical region    Anemia    Primary hypertension    Radiculopathy 01/07/2022   Cervical radiculopathy 12/29/2021   C6 cervical fracture ([place]) 12/29/2021   C5 cervical fracture ([place]) 12/21/2021   Advanced directives, counseling/discussion 09/23/2021   Strep throat 08/18/2021   Swelling of right index finger 07/14/2021   Synovitis of right knee    Scoliosis 01/13/2021   Osteopenia 07/02/2020   Status post left knee surgery 06/01/2020   Pulmonary nodule 05/27/2020   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   Chronic patellofemoral pain of right knee 05/05/2020    Positive ANA (antinuclear antibody) 06/05/2019   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   Unilateral primary osteoarthritis, left hip 01/28/2019   Pelvic pain 12/12/2018   Common bile duct (CBD) obstruction    Venous insufficiency of left lower extremity 07/04/2018   Dyslipidemia 09/22/2017   DDD (degenerative disc disease), cervical 04/03/2017   DDD (degenerative disc disease), lumbar 04/03/2017   Onychomycosis 03/21/2017   Dysphagia 10/18/2016   Encounter for chronic pain management 10/18/2016   Biliary stasis 09/26/2016   HNP (herniated nucleus pulposus), lumbar 09/23/2015   Iron deficiency 07/30/2015   Health maintenance examination 06/15/2015   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   Clavicle enlargement 12/13/2014   Prediabetes 12/13/2014   Plantar fasciitis, bilateral 09/11/2014   Medicare annual wellness visit, subsequent 06/10/2014   Abnormal thyroid function test 06/10/2014   Chronic pain syndrome 06/10/2014   CFS (chronic fatigue syndrome) 04/08/2014   Postmenopausal atrophic vaginitis 10/19/2012   Positive QuantiFERON-TB Gold test  02/07/2012   Cervical disc disorder with radiculopathy of cervical region 05/28/2010   APHTHOUS ULCERS 01/31/2008   Insomnia 11/09/2007   Constipation 10/11/2007   Allergic rhinitis 04/12/2007   MDD (major depressive disorder), recurrent episode, moderate ([place]) 03/05/2007   [name] syndrome 03/05/2007   GERD 03/05/2007   ROSACEA 03/05/2007   NEURALGIA 03/05/2007   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[place]) 12/06/2006   Chronic interstitial cystitis 12/06/2006   Fibromyalgia 12/06/2006    ONSET DATE: 01/12/2022 (Referral date), surgery 12/29/21 and revision on 12/30/21  REFERRING DIAG:  M54.10 (ICD-10-CM) - Radiculopathy, site unspecified  M43.22 (ICD-10-CM) - Fusion of spine, cervical region    THERAPY DIAG:  Muscle weakness (generalized)  Other abnormalities of gait and mobility  Unsteadiness on feet  Other lack of  coordination  Other disturbances of skin sensation  Pain in left arm  Rationale for Evaluation and Treatment Rehabilitation  SUBJECTIVE:   SUBJECTIVE STATEMENT: Pt reports cooking spaghetti last night with husband assisting with getting pots and pans out of the cabinet.  Pt accompanied by: self  PERTINENT HISTORY:  Chronic pain, [name] syndrome, GERD, cervical radiculopathy, fibromyalgia, scleroderma, herniated lumbar disc L4/L5, DDD, Partial right knee arthroplasty, scoliosis, Lt THA, interstitial cystitis From rehab admit note:  "ACDF 2004 C5-6 and C6-7 and 2017 C3-4 and C4-5, left total hip arthroplasty 2020, scleroderma.  Presented 12/29/2021 with noted history of fall 10/29/2021 suffering a C5-6 fracture subluxation/cervical kyphosis with persistent neck pain and initial conservative care with failed medical management.  Underwent 3 stage anterior posterior fusion C5-6 corpectomy, posterior fusion C3-7, ACDF C3-7 12/29/2021.  Postop she developed left hand weakness consistent with a C8 radiculopathy as well as small amount of epidural fluid at C5-6.  Cervical CT demonstrated that the left T1 pedicle screw was a bit low and into the neural foramen.  Patient underwent revision of posterior cervical instrumentation 12/30/2021 per Dr. Newman Pies.  Placed in a cervical hard collar when out of bed."   PRECAUTIONS: Cervical and Fall-cervical hard collar when OOB, NO shoulder ROM > 90* until further clarification from MD, no lifting    PAIN:  Are you having pain? Yes: NPRS scale: 2/10 Pain location: Neck and Lt arm Pain description: Lt tingling/numb and achy from shoulder to hand, throbbing in Lt shoulder blade Aggravating factors: dressing, exercise Relieving factors: rest, elevation, meds   PATIENT GOALS get Rt arm a little stronger, writing, and get Lt arm better and stronger  OBJECTIVE:   HAND DOMINANCE: Right   TODAY'S TREATMENT:  02/11/22  Hand Gripper: with LUE on level 2  with black spring. Pt picked up 1 inch blocks with gripper with mod drops and mod difficulty. Pt completed with RUE with less difficulty. Small Peg Board with LUE with in hand manipulation for placing pegs into board. Pt removed with in hand manipulation. Pt copied pattern with 100% accuracy. Pt req'd no add'l cues and self corrected errors and completed with min difficulty and drops.     HOME EXERCISE PROGRAM: 02/04/22 - yellow theraputty Access Code: [email or web address]    GOALS: Goals reviewed with patient? Yes  SHORT TERM GOALS: Target date: 03/05/22  Independent w/ HEP for bilateral hand coordination and strength (more focus on LUE)  Baseline: Goal status: MET  2.  Pt to write short paragraph maintaining 90% or greater legibility Baseline:  Goal status: IN PROGRESS  3.  Pt to cut food mod I w/ AE prn Baseline:  Goal status: MET reports cutting up food independently this week  4.  Pt to perform bathing w/ direct supervision only using AE prn Baseline:  Goal status: IN PROGRESS pt reports still needing some assistance with set up from husband  5.  Pt to make simple snack/sandwich w/ supervision Baseline:  Goal status: IN PROGRESS - reports making spaghetti and other meals    LONG TERM GOALS: Target date: 05/05/22  Independent w/ updated BUE strengthening HEP  Baseline:  Goal status: INITIAL  2.  Pt to improve coordination bilaterally as evidenced by performing 9 hole peg test in 23 sec or less Rt hand, and 27 sec or less Lt hand Baseline: Rt = 26.78 sec, Lt = 31.28 sec Goal status: INITIAL  3.  Pt to improve grip strength by 5 lbs Rt hand, by 10 lbs Lt hand Baseline: Rt = 46 lbs, Lt = 29 lbs Goal status: INITIAL  4.  Pt to return to simple stovetop cooking with supervision Baseline:  Goal status: IN PROGRESS reports making spaghetti and other meals   5.  Pt to perform light IADLS (laundry, making bed, washing dishes) at mod I level Baseline:  Goal status:  INITIAL   ASSESSMENT:  CLINICAL IMPRESSION: Pt progressing with overall BUE use. Increased coordination with BUE however continue sot be weak and fatigue with endurance work with [place]. Still unclear about precautions with lifting and AROM for shoulders.  PERFORMANCE DEFICITS in functional skills including ADLs, IADLs, coordination, dexterity, sensation, ROM, strength, pain, FMC, mobility, balance, body mechanics, endurance, decreased knowledge of precautions, decreased knowledge of use of DME, and UE functional use.   IMPAIRMENTS are limiting patient from ADLs, IADLs, leisure, and social participation.   COMORBIDITIES has co-morbidities such as scleroderma, Raynauds, scoliosis, OA  that affects occupational performance. Patient will benefit from skilled OT to address above impairments and improve overall function.  MODIFICATION OR ASSISTANCE TO COMPLETE EVALUATION: Min-Moderate modification of tasks or assist with assess necessary to complete an evaluation.  OT OCCUPATIONAL PROFILE AND HISTORY: Detailed assessment: Review of records and additional review of physical, cognitive, psychosocial history related to current functional performance.  CLINICAL DECISION MAKING: Moderate - several treatment options, min-mod task modification necessary  REHAB POTENTIAL: Good  EVALUATION COMPLEXITY: Moderate    PLAN: OT FREQUENCY: 2x/week  OT DURATION: 12 weeks (anticipate only 8-10 weeks needed)   PLANNED INTERVENTIONS: self care/ADL training, therapeutic exercise, therapeutic activity, neuromuscular re-education, passive range of motion, functional mobility training, splinting, paraffin, fluidotherapy, moist heat, cryotherapy, patient/family education, energy conservation, coping strategies training, DME and/or AE instructions, and Re-evaluation  RECOMMENDED OTHER SERVICES: NONE  CONSULTED AND AGREED WITH PLAN OF CARE: Patient  PLAN FOR NEXT SESSION: coordination BUE and grip strengthening,  check on clarification of precautions   Zachery Conch, OT 02/11/2022, 1:46 PM

## 2022-02-11 NOTE — Therapy (Signed)
OUTPATIENT PHYSICAL THERAPY NEURO TREATMENT   Patient Name: Rachel Duran MRN: [id number] DOB:1955/08/29, 66 y.o., female Today's Date: 02/11/2022   PCP: Ria Bush, MD REFERRING PROVIDER: Courtney Heys, MD    PT End of Session - 02/11/22 1326     Visit Number 5    Number of Visits 19   18 + eval   Date for PT Re-Evaluation 04/29/22   pushed out for scheduling   Authorization Type [employer]IC[employer]    Progress Note Due on Visit 10    PT Start Time 1323   PT ran over w/ eval   PT Stop Time 1410    PT Time Calculation (min) 47 min    Equipment Utilized During Treatment Gait belt;Cervical collar    Activity Tolerance Patient tolerated treatment well    Behavior During Therapy WFL for tasks assessed/performed                Past Medical History:  Diagnosis Date   Abdominal pain last 4 months   and nausea also   Allergy    Anemia    history of   Anxiety    Bipolar disorder ([place])    atpical bipolar disorder   Cervical disc disease limited rom turning to left   hx. C6- C7 -hx. past fusion(bone graft used)   Cholecystitis    Chronic pain    DDD (degenerative disc disease), lumbar 09/2015   dextroscoliosis with multilevel DDD and facet arthrosis most notable for R foraminal disc protrusion L4/5 producing severe R neural foraminal stenosis abutting R L4 nerve root, moderate spinal canal and mild lat recesss and R neural foraminal stenosis L3/4 (MRI)   Depression    bipolar depression   Disorders of porphyrin metabolism    Felon of finger of left hand 11/10/2016   Fibromyalgia    GERD (gastroesophageal reflux disease)    Headache    occasionally   Internal hemorrhoids    Interstitial cystitis 06/06/2012   hx.   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 (Dr Baird Cancer retinologist)   Tuberculosis    Past Surgical History:  Procedure Laterality Date   ANTERIOR CERVICAL DECOMP/DISCECTOMY FUSION  2004   C5/6, C6/7   ANTERIOR CERVICAL DECOMP/DISCECTOMY FUSION  02/2016   C3/4, C4/5 with plating [name])   ANTERIOR CERVICAL DECOMP/DISCECTOMY FUSION  12/29/2021   Procedure: ANTERIOR CERVICAL DECOMPRESSION/ DISCECTOMY FUSION CERVICAL THREE - SEVEN;  Surgeon: Newman Pies, MD;  Location: [place];  Service: Neurosurgery;;   ANTERIOR CERVICAL DECOMPRESSION/DISCECTOMY FUSION 4 LEVELS N/A 12/29/2021   Procedure: CERVICAL FIVE-SIX CORPECTOMY,;  Surgeon: Newman Pies, MD;  Location: [place];  Service: Neurosurgery;  Laterality: N/A;   AUGMENTATION MAMMAPLASTY Bilateral 03/25/2010   BREAST ENHANCEMENT SURGERY  2010   BREAST IMPLANT EXCHANGE  10/2014   exchange saline implants, B mastopexy/capsulorraphy ([facility])   BUNIONECTOMY Bilateral yrs ago   [place]   x 1   CHOLECYSTECTOMY  06/11/2012   Procedure: LAPAROSCOPIC  CHOLECYSTECTOMY WITH INTRAOPERATIVE CHOLANGIOGRAM;  Surgeon: [name], MD,FACS;  Location: [facility];  Service: General;  Laterality: N/A;   COLONOSCOPY  02/2018   done for positive cologuard - WNL, rpt 10 yrs Fuller Plan)   CYSTOSCOPY     ERCP  05/22/2012   Procedure: ENDOSCOPIC RETROGRADE CHOLANGIOPANCREATOGRAPHY (ERCP);  Surgeon: [name], MD,FACG;  Location: [facility];  Service: Endoscopy;  Laterality: N/A;   ERCP N/A 09/17/2013   Procedure: ENDOSCOPIC RETROGRADE CHOLANGIOPANCREATOGRAPHY (ERCP);  Surgeon: [name], MD;  Location: [facility];  Service: Endoscopy;  Laterality: N/A;   ERCP N/A 09/27/2018   Procedure: ENDOSCOPIC RETROGRADE  CHOLANGIOPANCREATOGRAPHY (ERCP);  Surgeon: [name], MD;  Location: [facility];  Service: Endoscopy;  Laterality: N/A;   ESOPHAGOGASTRODUODENOSCOPY  09/2016   WNL. esophagus dilated Fuller Plan)   HEMORRHOID BANDING  09/23/2013   --Dr. Greer Pickerel   HERNIA REPAIR     inguinal   HYSTEROSCOPY W/ ENDOMETRIAL ABLATION     KNEE ARTHROSCOPY Right 04/06/2021   Procedure: RIGHT KNEE ARTHROSCOPY WITH DEBRIDEMENT;  Surgeon: [name], MD;  Location: [place];  Service: Orthopedics;  Laterality: Right;   NASAL SINUS SURGERY     x5   PARTIAL KNEE ARTHROPLASTY Right 06/01/2020   Procedure: Right knee patellofemoral replacement;  Surgeon: [name], MD;  Location: [place];  Service: Orthopedics;  Laterality: Right;   POSTERIOR CERVICAL FUSION/FORAMINOTOMY N/A 12/29/2021   Procedure: POSTERIOR CERVICAL FUSION CERVICAL THREE-SEVEN;  Surgeon: Newman Pies, MD;  Location: [place];  Service: Neurosurgery;  Laterality: N/A;   REMOVAL OF STONES  09/27/2018   Procedure: REMOVAL OF STONES;  Surgeon: [name], MD;  Location: [facility];  Service: Endoscopy;;   SPHINCTEROTOMY  09/27/2018   Procedure: SPHINCTEROTOMY;  Surgeon: [name], MD;  Location[facility];  Service: Endoscopy;;   TONSILLECTOMY     removed as a child   TOTAL HIP ARTHROPLASTY Left 03/12/2019   Procedure: LEFT TOTAL HIP ARTHROPLASTY ANTERIOR APPROACH;  [name], [name], MD)   UPPER GASTROINTESTINAL ENDOSCOPY     Patient Active Problem List   Diagnosis Date Noted   Fusion of spine, cervical region    Anemia    Primary hypertension    Radiculopathy 01/07/2022   Cervical radiculopathy 12/29/2021   C6 cervical fracture ([place]) 12/29/2021   C5 cervical fracture ([place]) 12/21/2021   Advanced directives, counseling/discussion 09/23/2021   Strep throat 08/18/2021   Swelling of right index finger 07/14/2021   Synovitis of right knee    Scoliosis 01/13/2021    Osteopenia 07/02/2020   Status post left knee surgery 06/01/2020   Pulmonary nodule 05/27/2020   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   Chronic patellofemoral pain of right knee 05/05/2020   Positive ANA (antinuclear antibody) 06/05/2019   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   Unilateral primary osteoarthritis, left hip 01/28/2019   Pelvic pain 12/12/2018   Common bile duct (CBD) obstruction    Venous insufficiency of left lower extremity 07/04/2018   Dyslipidemia 09/22/2017   DDD (degenerative disc disease), cervical 04/03/2017   DDD (degenerative disc disease), lumbar 04/03/2017   Onychomycosis 03/21/2017   Dysphagia 10/18/2016   Encounter for chronic pain management 10/18/2016   Biliary stasis 09/26/2016   HNP (herniated nucleus pulposus), lumbar 09/23/2015   Iron deficiency 07/30/2015   Health maintenance examination 06/15/2015   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   Clavicle enlargement 12/13/2014   Prediabetes 12/13/2014   Plantar fasciitis, bilateral 09/11/2014  Medicare annual wellness visit, subsequent 06/10/2014   Abnormal thyroid function test 06/10/2014   Chronic pain syndrome 06/10/2014   CFS (chronic fatigue syndrome) 04/08/2014   Postmenopausal atrophic vaginitis 10/19/2012   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   Cervical disc disorder with radiculopathy of cervical region 05/28/2010   APHTHOUS ULCERS 01/31/2008   Insomnia 11/09/2007   Constipation 10/11/2007   Allergic rhinitis 04/12/2007   MDD (major depressive disorder), recurrent episode, moderate ([place]) 03/05/2007   [name] syndrome 03/05/2007   GERD 03/05/2007   ROSACEA 03/05/2007   NEURALGIA 03/05/2007   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[place]) 12/06/2006   Chronic interstitial cystitis 12/06/2006   Fibromyalgia 12/06/2006    ONSET DATE: 01/12/2022 (referral)  REFERRING DIAG: M54.10 (ICD-10-CM) - Radiculopathy, site unspecified M43.22 (ICD-10-CM) - Fusion of spine,  cervical region   THERAPY DIAG:  Muscle weakness (generalized)  Other abnormalities of gait and mobility  Unsteadiness on feet  Other lack of coordination  Rationale for Evaluation and Treatment Rehabilitation   PERTINENT HISTORY: Chronic pain, [name] syndrome, GERD, cervical radiculopathy, fibromyalgia, hereditary macular degeneration (she has most trouble with peripheral vision), herniated lumbar disc L4/L5, DDD, Partial right knee arthroplasty, scoliosis  From rehab admit note:  "ACDF 2004 C5-6 and C6-7 and 2017 C3-4 and C4-5, left total hip arthroplasty 2020, scleroderma.  Presented 12/29/2021 with noted history of fall 10/29/2021 suffering a C5-6 fracture subluxation/cervical kyphosis with persistent neck pain and initial conservative care with failed medical management.  Underwent 3 stage anterior posterior fusion C5-6 corpectomy, posterior fusion C3-7, ACDF C3-7 12/29/2021.  Postop she developed left hand weakness consistent with a C8 radiculopathy as well as small amount of epidural fluid at C5-6.  Cervical CT demonstrated that the left T1 pedicle screw was a bit low and into the neural foramen.  Patient underwent revision of posterior cervical instrumentation 12/30/2021 per Dr. Newman Pies.  Placed in a cervical hard collar when out of bed."    PRECAUTIONS: Cervical and Fall-cervical hard collar when OOB   SUBJECTIVE: No falls. Her nausea is better as long as she doesn't close her eyes.  She has been walking 2 blocks w/ RW.   Been walking w/ cane 70% of time in home and walker rest of time.                                                                                                                                                                                            PAIN:  Are you having pain? Yes: NPRS scale: 2/10 Pain location: left side of neck into left arm Pain description: achy, tingling, burning, numb, weak Aggravating factors: Moving  Relieving factors: resting,  elevating R knee (partial knee replacement) is minor irritant  TODAY'S TREATMENT:  RAMP:  Level of Assistance: SBA Assistive device utilized: Single point cane Ramp Comments: x4 up and down incline outside on sidewalk, dec speed and good safety awareness noted.  CURB:  Level of Assistance: SBA Assistive device utilized: Single point cane Curb Comments: x2 from sidewalk <>parking lot, edu on sequencing of SPC w/ good return demo by pt  STAIRS:  Level of Assistance: SBA  Stair Negotiation Technique: Alternating Pattern  Forwards With use of AD: SPC  with Single Rail on Left  Number of Stairs: 8   Height of Stairs: 6"  Comments: Pt uses LLE leading during ascent and RLE during descent.  Edu on use of SPC in community environments if no rail available vs at home on stairs to second level w/ husband providing close guarding (husband states comfort with this).  Improved confident of steps and BLE clearance during descent.  No ataxia noted with repetition.  GAIT: Gait pattern: step through pattern and decreased arm swing- Left Distance walked: 230' + 460' + 500' (80' in grass) Assistive device utilized: Single point cane Level of assistance: SBA Comments: Pt demonstrates improved fluidity of movement with increased distance.  She tolerates transition from RW to [facility] well without LOB.  Min cuing for sequencing and transition from step-to pattern to step-through as comfort level increased.  She intermittently engages in left arm swing.  Able to maintain some conversation throughout ambulation demonstrating improvement in activity tolerance.   -Step taps to 4th step w/ L rail progressed to 3rd step w/o UE support for kinesthetic awareness and SLS w/ light CGA, no LOB -step to midline red dot to promote large lunge-like step w/o UE support > right foot to right oriented dot and left foot to left oriented dot to promote anterolateral weight shifting without UE support; light CGA-SBA w/ 2 noted LOB  laterally w/ minA to correct  PATIENT EDUCATION: Education details: Continue with current HEP and walking program.  Edu on safety and gait pattern with cane in indoor and outdoor settings.  Cleared patient to use SPC (with best judgement based on surface type and distance) and supervision from husband with ambulation on walks in neighborhood or other outdoor/indoor settings to improve functional independence and promote continued improvement in activity tolerance. Person educated: Patient and Spouse Education method: Explanation Education comprehension: verbalized understanding   HOME EXERCISE PROGRAM: Access Code: [email or web address] URL: [email or web address] Date: 02/02/2022 Prepared by: Elease Etienne  Exercises - Sit to Stand with Arms Crossed  - 1 x daily - 4-5 x weekly - 2 sets - 10 reps - Standing Near Stance in Corner with Eyes Closed  - 1 x daily - 4-5 x weekly - 1 sets - 3-4 reps - 30 seconds hold - Tandem Stance in Corner  - 1 x daily - 5 x weekly - 1 sets - 3-4 reps - 30 seconds hold - Standing Single Leg Stance with Counter Support  - 1 x daily - 5 x weekly - 1 sets - 3-4 reps - 30 seconds hold    GOALS: Goals reviewed with patient? Yes  SHORT TERM GOALS: Target date: 02/18/2022  Pt will be independent with strength and balance HEP with supervision from family as needed. Baseline:  To be established. Goal status: INITIAL  2.  Pt will decrease 5xSTS to </=17 seconds w/o UE support in order to demonstrate decreased risk for falls and improved functional bilateral LE  strength and power. Baseline: 20.81sec w/ hands on knees Goal status: INITIAL  3.  Pt will demonstrate TUG of </=12 seconds in order to decrease risk of falls and improve functional mobility using LRAD. Baseline: 14.20 sec w/ RW Goal status: INITIAL  4.  Pt will increase BERG balance score to 40/56 to demonstrate improved static balance. Baseline: 36/56 Goal status: INITIAL  5.  Pt will  ambulate >/= 1000' feet w/ LRAD on 6MWT to demonstrate improved functional endurance for home and community participation. Baseline: 900' w/ RW Goal status: INITIAL  6.  Pt will report no pain at rest and during static standing activities to promote improved activity tolerance. Baseline: 3/10 Goal status: INITIAL  LONG TERM GOALS: Target date: 03/18/2022 (8 weeks); 04/15/2022 (12 weeks)  Pt will report return to neighborhood walking 3 days per week to promote return to prior level of function. Baseline:  Pt not walking in neighborhood due to balance. Goal status: INITIAL  2.  Pt will decrease 5xSTS to </=13 seconds w/o UE support in order to demonstrate decreased risk for falls and improved functional bilateral LE strength and power. Baseline: 20.81sec w/ hands on knees Goal status: INITIAL  3.  Pt will increase BERG balance score to 45/56 to demonstrate improved static balance. Baseline: 36/56 Goal status: INITIAL  4.  Pt will ambulate >/= 1200 feet w/ LRAD on 6MWT to demonstrate improved functional endurance for home and community participation. Baseline:  900' w/ RW Goal status: INITIAL  5.  Pt will ambulate >/=1000 feet on various indoor and outdoor surfaces with LRAD and modified independent level of assist to promote household and community access. Baseline: No distance formally calculated on eval. Goal status: INITIAL  6.  Pt will negotiate 14 stairs w/o UE support and reciprocal steps up and down independently to promote improved safety and independence within home environment. Baseline:  Pt demonstrates hesitancy with descending stairs. Goal status: INITIAL  ASSESSMENT:  CLINICAL IMPRESSION: Emphasis of skilled session on transition of ambulation from use of RW to use of SPC across surface types, inclined surfaces, curb steps, stairs, and various distances indoors and outdoors with patient tolerating challenge well.  She remains mildly limited by activity tolerance, but  demonstrates good safety awareness, has adequate support and supervision from spouse, and improving dynamic balance to degree that she is appropriate to use SPC outside of clinic as she feels comfortable.  Will continue to work on balance strategies and functional strength as patient transitions from cervical collar and restrictions in coming weeks.  She continues to benefit from skilled PT to progress towards LTGs.  OBJECTIVE IMPAIRMENTS Abnormal gait, decreased activity tolerance, decreased balance, decreased endurance, decreased mobility, difficulty walking, decreased strength, impaired UE functional use, and pain.   ACTIVITY LIMITATIONS carrying, lifting, bending, squatting, and stairs  PARTICIPATION LIMITATIONS: driving, community activity, and yard work  PERSONAL FACTORS Age, Fitness, Past/current experiences, Time since onset of injury/illness/exacerbation, and 1-2 comorbidities: chronic pain and DDD  are also affecting patient's functional outcome.   REHAB POTENTIAL: Good  CLINICAL DECISION MAKING: Evolving/moderate complexity  EVALUATION COMPLEXITY: Moderate  PLAN: PT FREQUENCY:  2x/week for 6 weeks followed by 1x/week for 6 weeks  PT DURATION: 12 weeks (cert 14 weeks - 25/03/5637)  PLANNED INTERVENTIONS: Therapeutic exercises, Therapeutic activity, Neuromuscular re-education, Balance training, Gait training, Patient/Family education, Joint mobilization, Stair training, Vestibular training, DME instructions, Cryotherapy, Moist heat, Manual therapy, and Re-evaluation  PLAN FOR NEXT SESSION:   Update HEP prn - BLE strengthening and dynamic  balance.  Gait training w/ no AD vs SPC.  Descending stairs.  Pt will be appropriate for FGA in future-maybe at [facility] assessment will add.  SLS-step taps up to 4th stair w/ airex.  Tilt board.  Treadmill training w/ incline.  Outside balance on grass/hill-pt has personal goals of going to grasshoppers game and walking dogs independently.  Elease Etienne, PT, DPT Outpatient Neuro [facility] [address], [place], [place] [address] [phone] 02/11/22, 4:33 PM

## 2022-02-16 ENCOUNTER — Ambulatory Visit: Payer: Medicare Other | Admitting: Occupational Therapy

## 2022-02-16 ENCOUNTER — Ambulatory Visit: Payer: Medicare Other | Admitting: Physical Therapy

## 2022-02-18 ENCOUNTER — Ambulatory Visit: Payer: Medicare Other | Admitting: Physical Therapy

## 2022-02-22 ENCOUNTER — Encounter: Payer: Self-pay | Admitting: Physical Therapy

## 2022-02-22 ENCOUNTER — Ambulatory Visit: Payer: Medicare Other | Admitting: Occupational Therapy

## 2022-02-22 ENCOUNTER — Ambulatory Visit: Payer: Medicare Other | Attending: Physical Medicine and Rehabilitation | Admitting: Physical Therapy

## 2022-02-22 DIAGNOSIS — R278 Other lack of coordination: Secondary | ICD-10-CM | POA: Insufficient documentation

## 2022-02-22 DIAGNOSIS — M25661 Stiffness of right knee, not elsewhere classified: Secondary | ICD-10-CM | POA: Insufficient documentation

## 2022-02-22 DIAGNOSIS — M6281 Muscle weakness (generalized): Secondary | ICD-10-CM | POA: Insufficient documentation

## 2022-02-22 DIAGNOSIS — R2689 Other abnormalities of gait and mobility: Secondary | ICD-10-CM | POA: Diagnosis present

## 2022-02-22 DIAGNOSIS — R2681 Unsteadiness on feet: Secondary | ICD-10-CM | POA: Insufficient documentation

## 2022-02-22 DIAGNOSIS — M79602 Pain in left arm: Secondary | ICD-10-CM | POA: Diagnosis present

## 2022-02-22 DIAGNOSIS — R208 Other disturbances of skin sensation: Secondary | ICD-10-CM | POA: Insufficient documentation

## 2022-02-22 NOTE — Therapy (Unsigned)
OUTPATIENT PHYSICAL THERAPY NEURO TREATMENT   Patient Name: Rachel Duran MRN: [id number] DOB:April 14, 1956, 66 y.o., female Today's Date: 02/23/2022   PCP: Ria Bush, MD REFERRING PROVIDER: Courtney Heys, MD    PT End of Session - 02/22/22 1152     Visit Number 6    Number of Visits 19   18 + eval   Date for PT Re-Evaluation 04/29/22   pushed out for scheduling   Authorization Type [employer]ICARE    Progress Note Due on Visit 10    PT Start Time 1149    PT Stop Time 1230    PT Time Calculation (min) 41 min    Equipment Utilized During Treatment Gait belt;Cervical collar    Activity Tolerance Patient tolerated treatment well    Behavior During Therapy WFL for tasks assessed/performed                Past Medical History:  Diagnosis Date   Abdominal pain last 4 months   and nausea also   Allergy    Anemia    history of   Anxiety    Bipolar disorder ([place])    atpical bipolar disorder   Cervical disc disease limited rom turning to left   hx. C6- C7 -hx. past fusion(bone graft used)   Cholecystitis    Chronic pain    DDD (degenerative disc disease), lumbar 09/2015   dextroscoliosis with multilevel DDD and facet arthrosis most notable for R foraminal disc protrusion L4/5 producing severe R neural foraminal stenosis abutting R L4 nerve root, moderate spinal canal and mild lat recesss and R neural foraminal stenosis L3/4 (MRI)   Depression    bipolar depression   Disorders of porphyrin metabolism    Felon of finger of left hand 11/10/2016   Fibromyalgia    GERD (gastroesophageal reflux disease)    Headache    occasionally   Internal hemorrhoids    Interstitial cystitis 06/06/2012   hx.   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 (Dr Baird Cancer retinologist)   Tuberculosis    Past Surgical History:  Procedure Laterality Date   ANTERIOR CERVICAL DECOMP/DISCECTOMY FUSION  2004   C5/6, C6/7   ANTERIOR CERVICAL DECOMP/DISCECTOMY FUSION  02/2016   C3/4, C4/5 with plating [name])   ANTERIOR CERVICAL DECOMP/DISCECTOMY FUSION  12/29/2021   Procedure: ANTERIOR CERVICAL DECOMPRESSION/ DISCECTOMY FUSION CERVICAL THREE - SEVEN;  Surgeon: Newman Pies, MD;  Location: [place];  Service: Neurosurgery;;   ANTERIOR CERVICAL DECOMPRESSION/DISCECTOMY FUSION 4 LEVELS N/A 12/29/2021   Procedure: CERVICAL FIVE-SIX CORPECTOMY,;  Surgeon: Newman Pies, MD;  Location: [place];  Service: Neurosurgery;  Laterality: N/A;   AUGMENTATION MAMMAPLASTY Bilateral 03/25/2010   BREAST ENHANCEMENT SURGERY  2010   BREAST IMPLANT EXCHANGE  10/2014   exchange saline implants, B mastopexy/capsulorraphy ([facility])   BUNIONECTOMY Bilateral yrs ago   [place]   x 1   CHOLECYSTECTOMY  06/11/2012   Procedure: LAPAROSCOPIC CHOLECYSTECTOMY WITH INTRAOPERATIVE CHOLANGIOGRAM;  Surgeon:  [name], MD,FACS;  Location: [facility];  Service: General;  Laterality: N/A;   COLONOSCOPY  02/2018   done for positive cologuard - WNL, rpt 10 yrs Fuller Plan)   CYSTOSCOPY     ERCP  05/22/2012   Procedure: ENDOSCOPIC RETROGRADE CHOLANGIOPANCREATOGRAPHY (ERCP);  Surgeon: [name], MD,FACG;  Location: [facility];  Service: Endoscopy;  Laterality: N/A;   ERCP N/A 09/17/2013   Procedure: ENDOSCOPIC RETROGRADE CHOLANGIOPANCREATOGRAPHY (ERCP);  Surgeon: [name], MD;  Location: [facility];  Service: Endoscopy;  Laterality: N/A;   ERCP N/A 09/27/2018   Procedure: ENDOSCOPIC RETROGRADE CHOLANGIOPANCREATOGRAPHY (ERCP);   Surgeon: [name], MD;  Location: [facility];  Service: Endoscopy;  Laterality: N/A;   ESOPHAGOGASTRODUODENOSCOPY  09/2016   WNL. esophagus dilated Fuller Plan)   HEMORRHOID BANDING  09/23/2013   --Dr. Greer Pickerel   HERNIA REPAIR     inguinal   HYSTEROSCOPY W/ ENDOMETRIAL ABLATION     KNEE ARTHROSCOPY Right 04/06/2021   Procedure: RIGHT KNEE ARTHROSCOPY WITH DEBRIDEMENT;  Surgeon: [name], MD;  Location: [place];  Service: Orthopedics;  Laterality: Right;   NASAL SINUS SURGERY     x5   PARTIAL KNEE ARTHROPLASTY Right 06/01/2020   Procedure: Right knee patellofemoral replacement;  Surgeon: [name], MD;  Location: [place];  Service: Orthopedics;  Laterality: Right;   POSTERIOR CERVICAL FUSION/FORAMINOTOMY N/A 12/29/2021   Procedure: POSTERIOR CERVICAL FUSION CERVICAL THREE-SEVEN;  Surgeon: Newman Pies, MD;  Location: [place];  Service: Neurosurgery;  Laterality: N/A;   REMOVAL OF STONES  09/27/2018   Procedure: REMOVAL OF STONES;  Surgeon: [name], MD;  Location: [facility];  Service: Endoscopy;;   SPHINCTEROTOMY  09/27/2018   Procedure: SPHINCTEROTOMY;  Surgeon: [name], MD;  Location[facility];  Service: Endoscopy;;   TONSILLECTOMY     removed as a child   TOTAL HIP ARTHROPLASTY Left 03/12/2019   Procedure: LEFT TOTAL HIP ARTHROPLASTY ANTERIOR APPROACH;  [name], [name], MD)   UPPER GASTROINTESTINAL ENDOSCOPY     Patient Active Problem List   Diagnosis Date Noted   Fusion of spine, cervical region    Anemia    Primary hypertension    Radiculopathy 01/07/2022   Cervical radiculopathy 12/29/2021   C6 cervical fracture ([place]) 12/29/2021   C5 cervical fracture ([place]) 12/21/2021   Advanced directives, counseling/discussion 09/23/2021   Strep throat 08/18/2021   Swelling of right index finger 07/14/2021   Synovitis of right knee    Scoliosis 01/13/2021   Osteopenia 07/02/2020   Status  post left knee surgery 06/01/2020   Pulmonary nodule 05/27/2020   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   Chronic patellofemoral pain of right knee 05/05/2020   Positive ANA (antinuclear antibody) 06/05/2019   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   Unilateral primary osteoarthritis, left hip 01/28/2019   Pelvic pain 12/12/2018   Common bile duct (CBD) obstruction    Venous insufficiency of left lower extremity 07/04/2018   Dyslipidemia 09/22/2017   DDD (degenerative disc disease), cervical 04/03/2017   DDD (degenerative disc disease), lumbar 04/03/2017   Onychomycosis 03/21/2017   Dysphagia 10/18/2016   Encounter for chronic pain management 10/18/2016   Biliary stasis 09/26/2016   HNP (herniated nucleus pulposus), lumbar 09/23/2015   Iron deficiency 07/30/2015   Health maintenance examination 06/15/2015   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   Clavicle enlargement 12/13/2014   Prediabetes 12/13/2014   Plantar fasciitis, bilateral 09/11/2014   Medicare annual wellness visit, subsequent  06/10/2014   Abnormal thyroid function test 06/10/2014   Chronic pain syndrome 06/10/2014   CFS (chronic fatigue syndrome) 04/08/2014   Postmenopausal atrophic vaginitis 10/19/2012   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   Cervical disc disorder with radiculopathy of cervical region 05/28/2010   APHTHOUS ULCERS 01/31/2008   Insomnia 11/09/2007   Constipation 10/11/2007   Allergic rhinitis 04/12/2007   MDD (major depressive disorder), recurrent episode, moderate ([place]) 03/05/2007   [name] syndrome 03/05/2007   GERD 03/05/2007   ROSACEA 03/05/2007   NEURALGIA 03/05/2007   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[place]) 12/06/2006   Chronic interstitial cystitis 12/06/2006   Fibromyalgia 12/06/2006    ONSET DATE: 01/12/2022 (referral)  REFERRING DIAG: M54.10 (ICD-10-CM) - Radiculopathy, site unspecified M43.22 (ICD-10-CM) - Fusion of spine, cervical region   THERAPY DIAG:   Muscle weakness (generalized)  Other abnormalities of gait and mobility  Unsteadiness on feet  Other lack of coordination  Rationale for Evaluation and Treatment Rehabilitation   PERTINENT HISTORY: Chronic pain, [name] syndrome, GERD, cervical radiculopathy, fibromyalgia, hereditary macular degeneration (she has most trouble with peripheral vision), herniated lumbar disc L4/L5, DDD, Partial right knee arthroplasty, scoliosis  From rehab admit note:  "ACDF 2004 C5-6 and C6-7 and 2017 C3-4 and C4-5, left total hip arthroplasty 2020, scleroderma.  Presented 12/29/2021 with noted history of fall 10/29/2021 suffering a C5-6 fracture subluxation/cervical kyphosis with persistent neck pain and initial conservative care with failed medical management.  Underwent 3 stage anterior posterior fusion C5-6 corpectomy, posterior fusion C3-7, ACDF C3-7 12/29/2021.  Postop she developed left hand weakness consistent with a C8 radiculopathy as well as small amount of epidural fluid at C5-6.  Cervical CT demonstrated that the left T1 pedicle screw was a bit low and into the neural foramen.  Patient underwent revision of posterior cervical instrumentation 12/30/2021 per Dr. Newman Pies.  Placed in a cervical hard collar when out of bed."    PRECAUTIONS: Cervical and Fall-cervical hard collar when OOB   SUBJECTIVE: Pt feels like she is doing well and walking without the cane at home 90% of the time, she uses it for outings, if her right knee hurts, or if she is tired.  She has progressed how far she is walking at home in the neighborhood with her husband and would like to walk outside without the cane.  She also would like to decrease her frequency to 1x/wk due to progress as well as her husband's schedule.  PAIN:  Are you having  pain? Yes: NPRS scale: 3/10 Pain location: right knee Pain description: stiff and nagging Aggravating factors: using bike Relieving factors: walking  TODAY'S TREATMENT:  Verbally reviewed HEP.  Pt would benefit from advancement. Assessed 5xSTS:  13.37sec w/o UE support Assessed TUG: -Trial 1 w/ SPC 9.94 sec w/ R LOB x1 independent in recovery -Trial 2 w/o SPC 8.19sec no LOB Assessed BERG:  [facility] PT Assessment - 02/22/22 1213       Berg Balance Test   Sit to Stand Able to stand without using hands and stabilize independently    Standing Unsupported Able to stand safely 2 minutes    Sitting with Back Unsupported but Feet Supported on Floor or Stool Able to sit safely and securely 2 minutes    Stand to Sit Sits safely with minimal use of hands    Transfers Able to transfer safely, minor use of hands    Standing Unsupported with Eyes Closed Able to stand 10 seconds safely    Standing Unsupported with Feet Together Able to place feet together independently and stand 1 minute safely    From Standing, Reach Forward with Outstretched Arm Can reach forward >12 cm safely (5")    From Standing Position, Pick up Object from Floor Able to pick up shoe safely and easily   uses squat method w/ head remaining vertical   From Standing Position, Turn to Look Behind Over each Shoulder Turn sideways only but maintains balance   remains in cervical collar   Turn 360 Degrees Able to turn 360 degrees safely but slowly    Standing Unsupported, Alternately Place Feet on Step/Stool Able to stand independently and safely and complete 8 steps in 20 seconds    Standing Unsupported, One Foot in Front Able to place foot tandem independently and hold 30 seconds    Standing on One Leg Able to lift leg independently and hold 5-10 seconds   17sec on RLE, 5sec on LLE   Total Score 50    Berg comment: Low fall risk            Assessed 6MWT w/ SPC:  756'  GAIT: Gait pattern: WFL and step through pattern Distance  walked: 69' (indoors on level surface) + 500' (outside on sidewalk) + 25' (in grass)  Assistive device utilized: Single point cane Level of assistance: SBA Comments: Initiated ambulation on sidewalk w/ use of SPC and transitioned to no AD once 6MWT completed.  Pt initially drifts to right side of path, but with min cuing corrects and maintains correction.  Pt demonstrates good safety awareness and quality of gait without AD.    Min cuing for safety with transition into grass and at initial incline approach due to cervical collar limitations.  Pt demonstrates adequate advance planning of gait due to collar limiting environmental scanning-recommended supervision when without AD due to this.  She maintain conversation with therapist throughout demonstrating improved activity tolerance.  PATIENT EDUCATION: Education details: Continue with current HEP and walking program.  Cleared patient to go without SPC (with best judgement based on surface type and distance) and supervision/HHA from husband with ambulation on walks in neighborhood or other outdoor/indoor settings to improve functional independence and promote continued improvement in activity tolerance. Person educated: Patient and Spouse Education method: Explanation Education comprehension: verbalized understanding   HOME EXERCISE PROGRAM: Access Code: [email or web address] URL: [email or web address] Date: 02/02/2022 Prepared by: Elease Etienne  Exercises - Sit to Stand with Arms Crossed  - 1 x  daily - 4-5 x weekly - 2 sets - 10 reps - Standing Near Stance in [place] with Eyes Closed  - 1 x daily - 4-5 x weekly - 1 sets - 3-4 reps - 30 seconds hold - Tandem Stance in Corner  - 1 x daily - 5 x weekly - 1 sets - 3-4 reps - 30 seconds hold - Standing Single Leg Stance with Counter Support  - 1 x daily - 5 x weekly - 1 sets - 3-4 reps - 30 seconds hold    GOALS: Goals reviewed with patient? Yes  SHORT TERM GOALS: Target date:  02/18/2022  Pt will be independent with strength and balance HEP with supervision from family as needed. Baseline:  Established, pt compliant, would benefit from progressions. Goal status: MET  2.  Pt will decrease 5xSTS to </=17 seconds w/o UE support in order to demonstrate decreased risk for falls and improved functional bilateral LE strength and power. Baseline: 20.81sec w/ hands on knees; 02/22/2022 13.37sec w/o UE support Goal status: MET  3.  Pt will demonstrate TUG of </=12 seconds in order to decrease risk of falls and improve functional mobility using LRAD. Baseline: 14.20 sec w/ RW; 02/22/2022 w/o SPC 8.19sec Goal status: MET  4.  Pt will increase BERG balance score to 40/56 to demonstrate improved static balance. Baseline: 36/56; 02/22/2022 50/56 Goal status: MET  5.  Pt will ambulate >/= 1000' feet w/ LRAD on 6MWT to demonstrate improved functional endurance for home and community participation. Baseline: 900' w/ RW; 756' w/ SPC Goal status: IN PROGRESS  6.  Pt will report no pain at rest and during static standing activities to promote improved activity tolerance. Baseline: 3/10; 02/22/2022 R knee pain 3/10  Goal status:  NOT MET  LONG TERM GOALS: Target date: 03/18/2022 (8 weeks); 04/15/2022 (12 weeks)  Pt will report return to neighborhood walking 3 days per week to promote return to prior level of function. Baseline:  Pt not walking in neighborhood due to balance. Goal status: INITIAL  2.  Pt will decrease 5xSTS to </=13 seconds w/o UE support in order to demonstrate decreased risk for falls and improved functional bilateral LE strength and power. Baseline: 20.81sec w/ hands on knees; 02/22/2022 13.37sec w/o UE  Goal status: INITIAL  3.  Pt will increase BERG balance score to >/=55/56 to demonstrate improved static balance. Baseline: 36/56; 02/22/2022 50/56 Goal status: REVISED  4.  Pt will ambulate >/= 1000 feet w/ LRAD on 6MWT to demonstrate improved functional endurance  for home and community participation. Baseline:  900' w/ RW Goal status: REVISED  5.  Pt will ambulate >/=1000 feet on various indoor and outdoor surfaces with LRAD and modified independent level of assist to promote household and community access. Baseline: No distance formally calculated on eval. Goal status: INITIAL  6.  Pt will negotiate 14 stairs w/o UE support and reciprocal steps up and down independently to promote improved safety and independence within home environment. Baseline:  Pt demonstrates hesitancy with descending stairs. Goal status: INITIAL  ASSESSMENT:  CLINICAL IMPRESSION: Assessment of STGs performed this session with pt meeting 4 of 6 goals and progressing towards the remaining 2 goals.  She continues to have some mild right knee pain at rest.  She ambulates 756' feet during 6MWT and >900' total w/ and w/o AD over varying indoor and outdoor surfaces.  PT anticipates pt could have met 6MWT goal if completed on only indoor surface.  At this time she is  safe to go without an AD with use of supervision for increased distance or outdoor surfaces.  Her TUG time improved to 8.19 sec w/o use of AD and her 5xSTS time improved to 13.37 sec without use of UE to push into standing.  Overall she is making excellent progress towards her LTGs with some modified to reflect current functional level.  OBJECTIVE IMPAIRMENTS Abnormal gait, decreased activity tolerance, decreased balance, decreased endurance, decreased mobility, difficulty walking, decreased strength, impaired UE functional use, and pain.   ACTIVITY LIMITATIONS carrying, lifting, bending, squatting, and stairs  PARTICIPATION LIMITATIONS: driving, community activity, and yard work  PERSONAL FACTORS Age, Fitness, Past/current experiences, Time since onset of injury/illness/exacerbation, and 1-2 comorbidities: chronic pain and DDD  are also affecting patient's functional outcome.   REHAB POTENTIAL: Good  CLINICAL DECISION  MAKING: Evolving/moderate complexity  EVALUATION COMPLEXITY: Moderate  PLAN: PT FREQUENCY:  1x/week for 6 weeks followed by 1x/week for 6 weeks (Discussed pt progress with pt and limitations in husband's schedule due to upcoming school year and dropping to 1x/wk for remainder of visits)  PT DURATION: 12 weeks (cert 14 weeks - 21/01/9809)  PLANNED INTERVENTIONS: Therapeutic exercises, Therapeutic activity, Neuromuscular re-education, Balance training, Gait training, Patient/Family education, Joint mobilization, Stair training, Vestibular training, DME instructions, Cryotherapy, Moist heat, Manual therapy, and Re-evaluation  PLAN FOR NEXT SESSION:   Did pt call and cancel appts needed for 1x/wk? Pt will be appropriate for FGA once collar is removed and restrictions lifted.  Review & Update HEP - BLE strengthening (R glut/quad) and dynamic balance.  Gait training w/ no AD.  Stair management.  SLS-step taps up to 4th stair w/ airex.  Tilt board.  Treadmill training w/ incline.  Outside balance on grass/hill-pt has personal goals of going to grasshoppers game and walking dogs independently.  Elease Etienne, PT, DPT Outpatient Neuro [facility] [address], [place], [place] [address] [phone] 02/23/22, 10:43 AM

## 2022-02-22 NOTE — Therapy (Signed)
OUTPATIENT OCCUPATIONAL THERAPY NEURO TREATMENT  Patient Name: Rachel Duran MRN: [id number] DOB:08-03-1955, 66 y.o., female Today's Date: 02/22/2022  PCP: Eustaquio Boyden, MD  REFERRING PROVIDER: Genice Rouge, MD    OT End of Session - 02/22/22 1239     Visit Number 5    Number of Visits 17    Date for OT Re-Evaluation 05/05/22    Authorization Type [employer] MCR    OT Start Time 1235    OT Stop Time 1315    OT Time Calculation (min) 40 min    Activity Tolerance Patient tolerated treatment well    Behavior During Therapy WFL for tasks assessed/performed             Past Medical History:  Diagnosis Date   Abdominal pain last 4 months   and nausea also   Allergy    Anemia    history of   Anxiety    Bipolar disorder (HCC)    atpical bipolar disorder   Cervical disc disease limited rom turning to left   hx. C6- C7 -hx. past fusion(bone graft used)   Cholecystitis    Chronic pain    DDD (degenerative disc disease), lumbar 09/2015   dextroscoliosis with multilevel DDD and facet arthrosis most notable for R foraminal disc protrusion L4/5 producing severe R neural foraminal stenosis abutting R L4 nerve root, moderate spinal canal and mild lat recesss and R neural foraminal stenosis L3/4 (MRI)   Depression    bipolar depression   Disorders of porphyrin metabolism    Felon of finger of left hand 11/10/2016   Fibromyalgia    GERD (gastroesophageal reflux disease)    Headache    occasionally   Internal hemorrhoids    Interstitial cystitis 06/06/2012   hx.   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 (Dr Allyne Gee retinologist)   Tuberculosis    Past Surgical History:  Procedure Laterality Date   ANTERIOR CERVICAL DECOMP/DISCECTOMY FUSION  2004   C5/6, C6/7   ANTERIOR CERVICAL DECOMP/DISCECTOMY FUSION  02/2016   C3/4, C4/5 with plating Lovell Sheehan)   ANTERIOR CERVICAL DECOMP/DISCECTOMY FUSION  12/29/2021   Procedure: ANTERIOR CERVICAL DECOMPRESSION/ DISCECTOMY FUSION CERVICAL THREE - SEVEN;  Surgeon: Tressie Stalker, MD;  Location: [facility] OR;  Service: Neurosurgery;;   ANTERIOR CERVICAL DECOMPRESSION/DISCECTOMY FUSION 4 LEVELS N/A 12/29/2021   Procedure: CERVICAL FIVE-SIX CORPECTOMY,;  Surgeon: Tressie Stalker, MD;  Location: [facility] OR;  Service: Neurosurgery;  Laterality: N/A;   AUGMENTATION MAMMAPLASTY Bilateral 03/25/2010   BREAST ENHANCEMENT SURGERY  2010   BREAST IMPLANT EXCHANGE  10/2014   exchange saline implants, B mastopexy/capsulorraphy ([facility])   BUNIONECTOMY Bilateral yrs ago   CESAREAN SECTION  1985   x 1   CHOLECYSTECTOMY  06/11/2012   Procedure: LAPAROSCOPIC CHOLECYSTECTOMY WITH INTRAOPERATIVE CHOLANGIOGRAM;  Surgeon: Atilano Ina, MD,FACS;  Location: [facility] ORS;  Service: General;  Laterality: N/A;   COLONOSCOPY  02/2018   done for positive cologuard - WNL, rpt 10 yrs Russella Dar)  CYSTOSCOPY     ERCP  05/22/2012   Procedure: ENDOSCOPIC RETROGRADE CHOLANGIOPANCREATOGRAPHY (ERCP);  Surgeon: [name], MD,FACG;  Location: [facility];  Service: Endoscopy;  Laterality: N/A;   ERCP N/A 09/17/2013   Procedure: ENDOSCOPIC RETROGRADE CHOLANGIOPANCREATOGRAPHY (ERCP);  Surgeon: [name], MD;  Location: [facility];  Service: Endoscopy;  Laterality: N/A;   ERCP N/A 09/27/2018   Procedure: ENDOSCOPIC RETROGRADE CHOLANGIOPANCREATOGRAPHY (ERCP);  Surgeon: [name], MD;  Location: [facility];  Service: Endoscopy;  Laterality: N/A;   ESOPHAGOGASTRODUODENOSCOPY  09/2016   WNL. esophagus dilated Fuller Plan)    HEMORRHOID BANDING  09/23/2013   --Dr. Greer Pickerel   HERNIA REPAIR     inguinal   HYSTEROSCOPY W/ ENDOMETRIAL ABLATION     KNEE ARTHROSCOPY Right 04/06/2021   Procedure: RIGHT KNEE ARTHROSCOPY WITH DEBRIDEMENT;  Surgeon: [name], MD;  Location: [place];  Service: Orthopedics;  Laterality: Right;   NASAL SINUS SURGERY     x5   PARTIAL KNEE ARTHROPLASTY Right 06/01/2020   Procedure: Right knee patellofemoral replacement;  Surgeon: [name], MD;  Location: [place];  Service: Orthopedics;  Laterality: Right;   POSTERIOR CERVICAL FUSION/FORAMINOTOMY N/A 12/29/2021   Procedure: POSTERIOR CERVICAL FUSION CERVICAL THREE-SEVEN;  Surgeon: Newman Pies, MD;  Location: [place];  Service: Neurosurgery;  Laterality: N/A;   REMOVAL OF STONES  09/27/2018   Procedure: REMOVAL OF STONES;  Surgeon: [name], MD;  Location: [facility] ENDOSCOPY;  Service: Endoscopy;;   SPHINCTEROTOMY  09/27/2018   Procedure: SPHINCTEROTOMY;  Surgeon: [name], MD;  Location: [facility] END[facility];  Service: Endoscopy;;   TONSILLECTOMY     removed as a child   TOTAL HIP ARTHROPLASTY Left 03/12/2019   Procedure: LEFT TOTAL HIP ARTHROPLASTY ANTERIOR APPROACH;  [name], [name], MD)   UPPER GASTROINTESTINAL ENDOSCOPY     Patient Active Problem List   Diagnosis Date Noted   Fusion of spine, cervical region    Anemia    Primary hypertension    Radiculopathy 01/07/2022   Cervical radiculopathy 12/29/2021   C6 cervical fracture ([place]) 12/29/2021   C5 cervical fracture ([place]) 12/21/2021   Advanced directives, counseling/discussion 09/23/2021   Strep throat 08/18/2021   Swelling of right index finger 07/14/2021   Synovitis of right knee    Scoliosis 01/13/2021   Osteopenia 07/02/2020   Status post left knee surgery 06/01/2020   Pulmonary nodule 05/27/2020   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   Chronic patellofemoral pain of right knee 05/05/2020    Positive ANA (antinuclear antibody) 06/05/2019   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   Unilateral primary osteoarthritis, left hip 01/28/2019   Pelvic pain 12/12/2018   Common bile duct (CBD) obstruction    Venous insufficiency of left lower extremity 07/04/2018   Dyslipidemia 09/22/2017   DDD (degenerative disc disease), cervical 04/03/2017   DDD (degenerative disc disease), lumbar 04/03/2017   Onychomycosis 03/21/2017   Dysphagia 10/18/2016   Encounter for chronic pain management 10/18/2016   Biliary stasis 09/26/2016   HNP (herniated nucleus pulposus), lumbar 09/23/2015   Iron deficiency 07/30/2015   Health maintenance examination 06/15/2015   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   Clavicle enlargement 12/13/2014   Prediabetes 12/13/2014   Plantar fasciitis, bilateral 09/11/2014   Medicare annual wellness visit, subsequent 06/10/2014   Abnormal thyroid function test 06/10/2014   Chronic pain syndrome 06/10/2014   CFS (chronic fatigue syndrome) 04/08/2014   Postmenopausal atrophic vaginitis 10/19/2012   Positive QuantiFERON-TB Gold test  02/07/2012   Cervical disc disorder with radiculopathy of cervical region 05/28/2010   APHTHOUS ULCERS 01/31/2008   Insomnia 11/09/2007   Constipation 10/11/2007   Allergic rhinitis 04/12/2007   MDD (major depressive disorder), recurrent episode, moderate ([place]) 03/05/2007   [name] syndrome 03/05/2007   GERD 03/05/2007   ROSACEA 03/05/2007   NEURALGIA 03/05/2007   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[place]) 12/06/2006   Chronic interstitial cystitis 12/06/2006   Fibromyalgia 12/06/2006    ONSET DATE: 01/12/2022 (Referral date), surgery 12/29/21 and revision on 12/30/21  REFERRING DIAG:  M54.10 (ICD-10-CM) - Radiculopathy, site unspecified  M43.22 (ICD-10-CM) - Fusion of spine, cervical region    THERAPY DIAG:  Other lack of coordination  Muscle weakness (generalized)  Rationale for Evaluation and Treatment  Rehabilitation  SUBJECTIVE:   SUBJECTIVE STATEMENT: I will call the doctor to get further clarification on ROM and lifting restrictions. I want to reduce down to 1x/wk. No pain today in neck and arm  Pt accompanied by: self  PERTINENT HISTORY:  Chronic pain, [name] syndrome, GERD, cervical radiculopathy, fibromyalgia, scleroderma, herniated lumbar disc L4/L5, DDD, Partial right knee arthroplasty, scoliosis, Lt THA, interstitial cystitis From rehab admit note:  "ACDF 2004 C5-6 and C6-7 and 2017 C3-4 and C4-5, left total hip arthroplasty 2020, scleroderma.  Presented 12/29/2021 with noted history of fall 10/29/2021 suffering a C5-6 fracture subluxation/cervical kyphosis with persistent neck pain and initial conservative care with failed medical management.  Underwent 3 stage anterior posterior fusion C5-6 corpectomy, posterior fusion C3-7, ACDF C3-7 12/29/2021.  Postop she developed left hand weakness consistent with a C8 radiculopathy as well as small amount of epidural fluid at C5-6.  Cervical CT demonstrated that the left T1 pedicle screw was a bit low and into the neural foramen.  Patient underwent revision of posterior cervical instrumentation 12/30/2021 per Dr. Newman Pies.  Placed in a cervical hard collar when out of bed."   PRECAUTIONS: Cervical and Fall-cervical hard collar when OOB, NO shoulder ROM > 90* until further clarification from MD, no lifting    PAIN:  Are you having pain? Yes: NPRS scale: 0/10 Pain location: Neck and Lt arm Pain description: Lt tingling/numb and achy from shoulder to hand, throbbing in Lt shoulder blade Aggravating factors: dressing, exercise Relieving factors: rest, elevation, meds   PATIENT GOALS get Rt arm a little stronger, writing, and get Lt arm better and stronger  OBJECTIVE:   HAND DOMINANCE: Right   TODAY'S TREATMENT:  02/22/22   Pt issued red resistance putty for more challenge in hand strength and reviewed putty HEP Pt issued  coordination HEP - see pt instructions for details. Pt had more difficulty in Rt hand for certain ex's and more difficulty in Lt hand for certain ex's  Assessed STG's and practiced writing. Pt able to write full paragraph with 95-100% legibility  HOME EXERCISE PROGRAM: 02/04/22 - yellow theraputty Access Code: [email or web address] 02/22/22: coordination HEP, upgraded to red putty HEP     GOALS: Goals reviewed with patient? Yes  SHORT TERM GOALS: Target date: 03/05/22  Independent w/ HEP for bilateral hand coordination and strength (more focus on LUE)  Baseline: Goal status: MET  2.  Pt to write short paragraph maintaining 90% or greater legibility Baseline:  Goal status: MET  3.  Pt to cut food mod I w/ AE prn Baseline:  Goal status: MET reports cutting up food independently this week  4.  Pt to perform bathing w/ direct supervision only using AE prn Baseline:  Goal status: MET  5.  Pt to make simple snack/sandwich w/ supervision Baseline:  Goal status: MET   LONG TERM GOALS: Target date: 05/05/22  Independent w/ updated BUE strengthening HEP  Baseline:  Goal status: INITIAL  2.  Pt to improve coordination bilaterally as evidenced by performing 9 hole peg test in 23 sec or less Rt hand, and 27 sec or less Lt hand Baseline: Rt = 26.78 sec, Lt = 31.28 sec Goal status: INITIAL  3.  Pt to improve grip strength by 5 lbs Rt hand, by 10 lbs Lt hand Baseline: Rt = 46 lbs, Lt = 29 lbs Goal status: INITIAL  4.  Pt to return to simple stovetop cooking with supervision Baseline:  Goal status: IN PROGRESS reports making spaghetti and other meals   5.  Pt to perform light IADLS (laundry, making bed, washing dishes) at mod I level Baseline:  Goal status: INITIAL   ASSESSMENT:  CLINICAL IMPRESSION: Pt met all STG's. Still unclear about precautions with lifting and AROM for shoulders however pt will call MD to find out since MD has not responded to telephone encounter.  PERFORMANCE  DEFICITS in functional skills including ADLs, IADLs, coordination, dexterity, sensation, ROM, strength, pain, FMC, mobility, balance, body mechanics, endurance, decreased knowledge of precautions, decreased knowledge of use of DME, and UE functional use.   IMPAIRMENTS are limiting patient from ADLs, IADLs, leisure, and social participation.   COMORBIDITIES has co-morbidities such as scleroderma, Raynauds, scoliosis, OA  that affects occupational performance. Patient will benefit from skilled OT to address above impairments and improve overall function.  MODIFICATION OR ASSISTANCE TO COMPLETE EVALUATION: Min-Moderate modification of tasks or assist with assess necessary to complete an evaluation.  OT OCCUPATIONAL PROFILE AND HISTORY: Detailed assessment: Review of records and additional review of physical, cognitive, psychosocial history related to current functional performance.  CLINICAL DECISION MAKING: Moderate - several treatment options, min-mod task modification necessary  REHAB POTENTIAL: Good  EVALUATION COMPLEXITY: Moderate    PLAN: OT FREQUENCY: 2x/week  OT DURATION: 12 weeks (anticipate only 8-10 weeks needed)   PLANNED INTERVENTIONS: self care/ADL training, therapeutic exercise, therapeutic activity, neuromuscular re-education, passive range of motion, functional mobility training, splinting, paraffin, fluidotherapy, moist heat, cryotherapy, patient/family education, energy conservation, coping strategies training, DME and/or AE instructions, and Re-evaluation  RECOMMENDED OTHER SERVICES: NONE  CONSULTED AND AGREED WITH PLAN OF CARE: Patient  PLAN FOR NEXT SESSION: check on clarification of precautions, progress towards LTG's. Pt to reduce down to 1x/wk per pt request   Hans Eden, OT 02/22/2022, 12:43 PM

## 2022-02-22 NOTE — Patient Instructions (Signed)
   Coordination Activities  Perform the following activities for 10 minutes 2 times per day with left hand(s).  Rotate ball in fingertips (clockwise and counter-clockwise) RT hand. Flip ball over in hand w/ Lt hand. Flip cards 1 at a time as fast as you can. Deal cards with your thumb (Hold deck in hand and push card off top with thumb). Rotate ONE card in hand (clockwise and counter-clockwise x 3 full revolutions EACH way). Pick up coins one at a time until you get 5 in your hand, then move coins from palm to fingertips to stack one at a time.

## 2022-02-25 ENCOUNTER — Ambulatory Visit: Payer: Medicare Other | Admitting: Physical Therapy

## 2022-02-25 ENCOUNTER — Ambulatory Visit: Payer: Medicare Other | Admitting: Occupational Therapy

## 2022-02-28 ENCOUNTER — Encounter: Payer: Self-pay | Admitting: Orthopedic Surgery

## 2022-02-28 ENCOUNTER — Encounter: Payer: Self-pay | Admitting: Family Medicine

## 2022-02-28 MED ORDER — CLINDAMYCIN HCL 150 MG PO CAPS: ORAL_CAPSULE | ORAL | 0 refills | Status: DC

## 2022-03-01 ENCOUNTER — Encounter: Payer: Self-pay | Admitting: Physical Therapy

## 2022-03-01 ENCOUNTER — Encounter: Payer: Self-pay | Admitting: Occupational Therapy

## 2022-03-01 ENCOUNTER — Ambulatory Visit: Payer: Medicare Other | Admitting: Physical Therapy

## 2022-03-01 ENCOUNTER — Ambulatory Visit: Payer: Medicare Other | Admitting: Occupational Therapy

## 2022-03-01 DIAGNOSIS — M6281 Muscle weakness (generalized): Secondary | ICD-10-CM | POA: Diagnosis not present

## 2022-03-01 DIAGNOSIS — R2681 Unsteadiness on feet: Secondary | ICD-10-CM

## 2022-03-01 DIAGNOSIS — R278 Other lack of coordination: Secondary | ICD-10-CM

## 2022-03-01 DIAGNOSIS — R2689 Other abnormalities of gait and mobility: Secondary | ICD-10-CM

## 2022-03-01 NOTE — Telephone Encounter (Signed)
Pt requests a 30-day supply (see pt msg)  [employer] Last OV:  12/21/21, chronic pain mgmt Next OV: 03/23/22, 3 mo f/u

## 2022-03-01 NOTE — Therapy (Signed)
OUTPATIENT PHYSICAL THERAPY NEURO TREATMENT   Patient Name: Rachel Duran MRN: [id number] DOB:26-Sep-1955, 66 y.o., female Today's Date: 03/01/2022   PCP: Ria Bush, MD REFERRING PROVIDER: Courtney Heys, MD    PT End of Session - 03/01/22 1151     Visit Number 7    Number of Visits 19   18 + eval   Date for PT Re-Evaluation 04/29/22   pushed out for scheduling   Authorization Type [employer]    Progress Note Due on Visit 10    PT Start Time 1147    PT Stop Time 1234    PT Time Calculation (min) 47 min    Equipment Utilized During Treatment Gait belt;Cervical collar    Activity Tolerance Patient tolerated treatment well    Behavior During Therapy WFL for tasks assessed/performed                Past Medical History:  Diagnosis Date   Abdominal pain last 4 months   and nausea also   Allergy    Anemia    history of   Anxiety    Bipolar disorder ([place])    atpical bipolar disorder   Cervical disc disease limited rom turning to left   hx. C6- C7 -hx. past fusion(bone graft used)   Cholecystitis    Chronic pain    DDD (degenerative disc disease), lumbar 09/2015   dextroscoliosis with multilevel DDD and facet arthrosis most notable for R foraminal disc protrusion L4/5 producing severe R neural foraminal stenosis abutting R L4 nerve root, moderate spinal canal and mild lat recesss and R neural foraminal stenosis L3/4 (MRI)   Depression    bipolar depression   Disorders of porphyrin metabolism    Felon of finger of left hand 11/10/2016   Fibromyalgia    GERD (gastroesophageal reflux disease)    Headache    occasionally   Internal hemorrhoids    Interstitial cystitis 06/06/2012   hx.   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 (Dr Baird Cancer retinologist)   Tuberculosis    Past Surgical History:  Procedure Laterality Date   ANTERIOR CERVICAL DECOMP/DISCECTOMY FUSION  2004   C5/6, C6/7   ANTERIOR CERVICAL DECOMP/DISCECTOMY FUSION  02/2016   C3/4, C4/5 with plating [name])   ANTERIOR CERVICAL DECOMP/DISCECTOMY FUSION  12/29/2021   Procedure: ANTERIOR CERVICAL DECOMPRESSION/ DISCECTOMY FUSION CERVICAL THREE - SEVEN;  Surgeon: Newman Pies, MD;  Location: [place];  Service: Neurosurgery;;   ANTERIOR CERVICAL DECOMPRESSION/DISCECTOMY FUSION 4 LEVELS N/A 12/29/2021   Procedure: CERVICAL FIVE-SIX CORPECTOMY,;  Surgeon: Newman Pies, MD;  Location: [place];  Service: Neurosurgery;  Laterality: N/A;   AUGMENTATION MAMMAPLASTY Bilateral 03/25/2010   BREAST ENHANCEMENT SURGERY  2010   BREAST IMPLANT EXCHANGE  10/2014   exchange saline implants, B mastopexy/capsulorraphy ([facility])   BUNIONECTOMY Bilateral yrs ago   [place]   x 1   CHOLECYSTECTOMY  06/11/2012   Procedure: LAPAROSCOPIC CHOLECYSTECTOMY WITH INTRAOPERATIVE CHOLANGIOGRAM;  Surgeon:  [name], MD,FACS;  Location: [facility];  Service: General;  Laterality: N/A;   COLONOSCOPY  02/2018   done for positive cologuard - WNL, rpt 10 yrs Fuller Plan)   CYSTOSCOPY     ERCP  05/22/2012   Procedure: ENDOSCOPIC RETROGRADE CHOLANGIOPANCREATOGRAPHY (ERCP);  Surgeon: [name], MD,FACG;  Location: [facility];  Service: Endoscopy;  Laterality: N/A;   ERCP N/A 09/17/2013   Procedure: ENDOSCOPIC RETROGRADE CHOLANGIOPANCREATOGRAPHY (ERCP);  Surgeon: [name], MD;  Location: [facility];  Service: Endoscopy;  Laterality: N/A;   ERCP N/A 09/27/2018   Procedure: ENDOSCOPIC RETROGRADE CHOLANGIOPANCREATOGRAPHY (ERCP);   Surgeon: [name], MD;  Location: [facility];  Service: Endoscopy;  Laterality: N/A;   ESOPHAGOGASTRODUODENOSCOPY  09/2016   WNL. esophagus dilated Fuller Plan)   HEMORRHOID BANDING  09/23/2013   --Dr. Greer Pickerel   HERNIA REPAIR     inguinal   HYSTEROSCOPY W/ ENDOMETRIAL ABLATION     KNEE ARTHROSCOPY Right 04/06/2021   Procedure: RIGHT KNEE ARTHROSCOPY WITH DEBRIDEMENT;  Surgeon: [name], MD;  Location: [place];  Service: Orthopedics;  Laterality: Right;   NASAL SINUS SURGERY     x5   PARTIAL KNEE ARTHROPLASTY Right 06/01/2020   Procedure: Right knee patellofemoral replacement;  Surgeon: [name], MD;  Location: [place];  Service: Orthopedics;  Laterality: Right;   POSTERIOR CERVICAL FUSION/FORAMINOTOMY N/A 12/29/2021   Procedure: POSTERIOR CERVICAL FUSION CERVICAL THREE-SEVEN;  Surgeon: Newman Pies, MD;  Location: [place];  Service: Neurosurgery;  Laterality: N/A;   REMOVAL OF STONES  09/27/2018   Procedure: REMOVAL OF STONES;  Surgeon: [name], MD;  [facility]: [facility];  Service: Endoscopy;;   SPHINCTEROTOMY  09/27/2018   Procedure: SPHINCTEROTOMY;  Surgeon: [name], MD;  [facility];  Service: Endoscopy;;   TONSILLECTOMY     removed as a child   TOTAL HIP ARTHROPLASTY Left 03/12/2019   Procedure: LEFT TOTAL HIP ARTHROPLASTY ANTERIOR APPROACH;  [name], [name], MD)   UPPER GASTROINTESTINAL ENDOSCOPY     Patient Active Problem List   Diagnosis Date Noted   Fusion of spine, cervical region    Anemia    Primary hypertension    Radiculopathy 01/07/2022   Cervical radiculopathy 12/29/2021   C6 cervical fracture ([place]) 12/29/2021   C5 cervical fracture ([place]) 12/21/2021   Advanced directives, counseling/discussion 09/23/2021   Strep throat 08/18/2021   Swelling of right index finger 07/14/2021   Synovitis of right knee    Scoliosis 01/13/2021   Osteopenia 07/02/2020   Status  post left knee surgery 06/01/2020   Pulmonary nodule 05/27/2020   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   Chronic patellofemoral pain of right knee 05/05/2020   Positive ANA (antinuclear antibody) 06/05/2019   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   Unilateral primary osteoarthritis, left hip 01/28/2019   Pelvic pain 12/12/2018   Common bile duct (CBD) obstruction    Venous insufficiency of left lower extremity 07/04/2018   Dyslipidemia 09/22/2017   DDD (degenerative disc disease), cervical 04/03/2017   DDD (degenerative disc disease), lumbar 04/03/2017   Onychomycosis 03/21/2017   Dysphagia 10/18/2016   Encounter for chronic pain management 10/18/2016   Biliary stasis 09/26/2016   HNP (herniated nucleus pulposus), lumbar 09/23/2015   Iron deficiency 07/30/2015   Health maintenance examination 06/15/2015   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   Clavicle enlargement 12/13/2014   Prediabetes 12/13/2014   Plantar fasciitis, bilateral 09/11/2014   Medicare annual wellness visit, subsequent  06/10/2014   Abnormal thyroid function test 06/10/2014   Chronic pain syndrome 06/10/2014   CFS (chronic fatigue syndrome) 04/08/2014   Postmenopausal atrophic vaginitis 10/19/2012   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   Cervical disc disorder with radiculopathy of cervical region 05/28/2010   APHTHOUS ULCERS 01/31/2008   Insomnia 11/09/2007   Constipation 10/11/2007   Allergic rhinitis 04/12/2007   MDD (major depressive disorder), recurrent episode, moderate ([place]) 03/05/2007   [name] syndrome 03/05/2007   GERD 03/05/2007   ROSACEA 03/05/2007   NEURALGIA 03/05/2007   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[place]) 12/06/2006   Chronic interstitial cystitis 12/06/2006   Fibromyalgia 12/06/2006    ONSET DATE: 01/12/2022 (referral)  REFERRING DIAG: M54.10 (ICD-10-CM) - Radiculopathy, site unspecified M43.22 (ICD-10-CM) - Fusion of spine, cervical region   THERAPY DIAG:   Other lack of coordination  Muscle weakness (generalized)  Other abnormalities of gait and mobility  Unsteadiness on feet  Rationale for Evaluation and Treatment Rehabilitation   PERTINENT HISTORY: Chronic pain, [name] syndrome, GERD, cervical radiculopathy, fibromyalgia, hereditary macular degeneration (she has most trouble with peripheral vision), herniated lumbar disc L4/L5, DDD, Partial right knee arthroplasty, scoliosis  From rehab admit note:  "ACDF 2004 C5-6 and C6-7 and 2017 C3-4 and C4-5, left total hip arthroplasty 2020, scleroderma.  Presented 12/29/2021 with noted history of fall 10/29/2021 suffering a C5-6 fracture subluxation/cervical kyphosis with persistent neck pain and initial conservative care with failed medical management.  Underwent 3 stage anterior posterior fusion C5-6 corpectomy, posterior fusion C3-7, ACDF C3-7 12/29/2021.  Postop she developed left hand weakness consistent with a C8 radiculopathy as well as small amount of epidural fluid at C5-6.  Cervical CT demonstrated that the left T1 pedicle screw was a bit low and into the neural foramen.  Patient underwent revision of posterior cervical instrumentation 12/30/2021 per Dr. Newman Pies.  Placed in a cervical hard collar when out of bed."    PRECAUTIONS: Cervical and Fall-cervical hard collar when OOB   SUBJECTIVE: Pt has a bit of contact dermatitis that she has contacted her MD about in regards to the cervical collar (still wearing appropriately) and states the ointment is helping.  She is walking with handheld assist from husband out in neighborhood and is able to go a little farther each time.                                                                                                                                                                                            PAIN:  Are you having pain? Yes: NPRS scale: 3/10 Pain location: left arm, right knee, back of neck Pain description: stiff (  knee),  sore and weak (arm), achy (neck) Aggravating factors: staying still too long Relieving factors: walking  TODAY'S TREATMENT:  Reviewed and modified HEP: -STS w/ arms crossed x10 -FT EC x30 sec, pt stable throughout -Alt tandem no UE support x30 sec each LE -SLS no UE support x24 sec on LLE, x9 sec on RLE -6" forward (2x20) and lateral (2x10) step ups - practiced with home setup left rail vs wall w/ instruction for supervision from husband -standing hip abduction and extension x10 each direction each LE -lateral stepping with band around thighs 6x10'  PATIENT EDUCATION: Education details: Continue walking w/ handheld assist or supervision from spouse.  Additions to HEP.  Discussed asking doctor about bending restrictions on next visit with Dr. [name] if collar removed. Person educated: Patient and Spouse Education method: Explanation Education comprehension: verbalized understanding   HOME EXERCISE PROGRAM: Access Code: [email or web address]  GOALS: Goals reviewed with patient? Yes  SHORT TERM GOALS: Target date: 02/18/2022  Pt will be independent with strength and balance HEP with supervision from family as needed. Baseline:  Established, pt compliant, would benefit from progressions. Goal status: MET  2.  Pt will decrease 5xSTS to </=17 seconds w/o UE support in order to demonstrate decreased risk for falls and improved functional bilateral LE strength and power. Baseline: 20.81sec w/ hands on knees; 02/22/2022 13.37sec w/o UE support Goal status: MET  3.  Pt will demonstrate TUG of </=12 seconds in order to decrease risk of falls and improve functional mobility using LRAD. Baseline: 14.20 sec w/ RW; 02/22/2022 w/o SPC 8.19sec Goal status: MET  4.  Pt will increase BERG balance score to 40/56 to demonstrate improved static balance. Baseline: 36/56; 02/22/2022 50/56 Goal status: MET  5.  Pt will ambulate >/= 1000' feet w/ LRAD on 6MWT to demonstrate improved functional endurance for home and  community participation. Baseline: 900' w/ RW; 756' w/ SPC Goal status: IN PROGRESS  6.  Pt will report no pain at rest and during static standing activities to promote improved activity tolerance. Baseline: 3/10; 02/22/2022 R knee pain 3/10  Goal status:  NOT MET  LONG TERM GOALS: Target date: 03/18/2022 (8 weeks); 04/15/2022 (12 weeks)  Pt will report return to neighborhood walking 3 days per week to promote return to prior level of function. Baseline:  Pt not walking in neighborhood due to balance. Goal status: INITIAL  2.  Pt will decrease 5xSTS to </=13 seconds w/o UE support in order to demonstrate decreased risk for falls and improved functional bilateral LE strength and power. Baseline: 20.81sec w/ hands on knees; 02/22/2022 13.37sec w/o UE  Goal status: INITIAL  3.  Pt will increase BERG balance score to >/=55/56 to demonstrate improved static balance. Baseline: 36/56; 02/22/2022 50/56 Goal status: REVISED  4.  Pt will ambulate >/= 1000 feet w/ LRAD on 6MWT to demonstrate improved functional endurance for home and community participation. Baseline:  900' w/ RW Goal status: REVISED  5.  Pt will ambulate >/=1000 feet on various indoor and outdoor surfaces with LRAD and modified independent level of assist to promote household and community access. Baseline: No distance formally calculated on eval. Goal status: INITIAL  6.  Pt will negotiate 14 stairs w/o UE support and reciprocal steps up and down independently to promote improved safety and independence within home environment. Baseline:  Pt demonstrates hesitancy with descending stairs. Goal status: INITIAL  ASSESSMENT:  CLINICAL IMPRESSION: Focus of skilled session on reviewing and advancing current HEP to reflect patient progress.  She continues to progress her overall balance and activity tolerance.  She remains somewhat limited dynamically due to limitations of cervical collar and reliance of visualization on feet during  activity.  Will continue to progress to more dynamic challenges in coming visits and incorporate complex movements outside BOS with cervical ROM when restrictions lifted.  OBJECTIVE IMPAIRMENTS Abnormal gait, decreased activity tolerance, decreased balance, decreased endurance, decreased mobility, difficulty walking, decreased strength, impaired UE functional use, and pain.   ACTIVITY LIMITATIONS carrying, lifting, bending, squatting, and stairs  PARTICIPATION LIMITATIONS: driving, community activity, and yard work  PERSONAL FACTORS Age, Fitness, Past/current experiences, Time since onset of injury/illness/exacerbation, and 1-2 comorbidities: chronic pain and DDD  are also affecting patient's functional outcome.   REHAB POTENTIAL: Good  CLINICAL DECISION MAKING: Evolving/moderate complexity  EVALUATION COMPLEXITY: Moderate  PLAN: PT FREQUENCY:  1x/week for 6 weeks followed by 1x/week for 6 weeks (Discussed pt progress with pt and limitations in husband's schedule due to upcoming school year and dropping to 1x/wk for remainder of visits)  PT DURATION: 12 weeks (cert 14 weeks - 33/11/4560)  PLANNED INTERVENTIONS: Therapeutic exercises, Therapeutic activity, Neuromuscular re-education, Balance training, Gait training, Patient/Family education, Joint mobilization, Stair training, Vestibular training, DME instructions, Cryotherapy, Moist heat, Manual therapy, and Re-evaluation  PLAN FOR NEXT SESSION:  Pt will be appropriate for FGA once collar is removed and restrictions lifted.  Update HEP prn - BLE strengthening (R glut/quad) and dynamic balance - bckwds/tandem walking.  Foam beam, bean bag toss on airex, reaching outside BOS.  Gait training w/ no AD.  Stair management.  SLS-step taps up to 4th stair w/ airex.  Tilt board.  Treadmill training w/ incline.  Outside balance on grass/hill-pt has personal goals of going to grasshoppers game and walking dogs independently.    Elease Etienne, PT,  DPT Outpatient Neuro [facility] [address], [place], [place] [address] [phone] 03/01/22, 2:40 PM

## 2022-03-01 NOTE — Therapy (Signed)
OUTPATIENT OCCUPATIONAL THERAPY NEURO TREATMENT  Patient Name: Rachel Duran MRN: [id number] DOB:1956/03/16, 66 y.o., female Today's Date: 03/01/2022  PCP: Eustaquio Boyden, MD  REFERRING PROVIDER: Genice Rouge, MD    OT End of Session - 03/01/22 1241     Visit Number 6    Number of Visits 17    Date for OT Re-Evaluation 05/05/22    Authorization Type [employer] MCR    OT Start Time 1235    OT Stop Time 1315    OT Time Calculation (min) 40 min    Activity Tolerance Patient tolerated treatment well    Behavior During Therapy WFL for tasks assessed/performed             Past Medical History:  Diagnosis Date   Abdominal pain last 4 months   and nausea also   Allergy    Anemia    history of   Anxiety    Bipolar disorder (HCC)    atpical bipolar disorder   Cervical disc disease limited rom turning to left   hx. C6- C7 -hx. past fusion(bone graft used)   Cholecystitis    Chronic pain    DDD (degenerative disc disease), lumbar 09/2015   dextroscoliosis with multilevel DDD and facet arthrosis most notable for R foraminal disc protrusion L4/5 producing severe R neural foraminal stenosis abutting R L4 nerve root, moderate spinal canal and mild lat recesss and R neural foraminal stenosis L3/4 (MRI)   Depression    bipolar depression   Disorders of porphyrin metabolism    Felon of finger of left hand 11/10/2016   Fibromyalgia    GERD (gastroesophageal reflux disease)    Headache    occasionally   Internal hemorrhoids    Interstitial cystitis 06/06/2012   hx.   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 (Dr Allyne Gee retinologist)   Tuberculosis    Past Surgical History:  Procedure Laterality Date   ANTERIOR CERVICAL DECOMP/DISCECTOMY FUSION  2004   C5/6, C6/7   ANTERIOR CERVICAL DECOMP/DISCECTOMY FUSION  02/2016   C3/4, C4/5 with plating Lovell Sheehan)   ANTERIOR CERVICAL DECOMP/DISCECTOMY FUSION  12/29/2021   Procedure: ANTERIOR CERVICAL DECOMPRESSION/ DISCECTOMY FUSION CERVICAL THREE - SEVEN;  Surgeon: Tressie Stalker, MD;  Location: [facility] OR;  Service: Neurosurgery;;   ANTERIOR CERVICAL DECOMPRESSION/DISCECTOMY FUSION 4 LEVELS N/A 12/29/2021   Procedure: CERVICAL FIVE-SIX CORPECTOMY,;  Surgeon: Tressie Stalker, MD;  Location: [facility] OR;  Service: Neurosurgery;  Laterality: N/A;   AUGMENTATION MAMMAPLASTY Bilateral 03/25/2010   BREAST ENHANCEMENT SURGERY  2010   BREAST IMPLANT EXCHANGE  10/2014   exchange saline implants, B mastopexy/capsulorraphy ([facility])   BUNIONECTOMY Bilateral yrs ago   CESAREAN SECTION  1985   x 1   CHOLECYSTECTOMY  06/11/2012   Procedure: LAPAROSCOPIC CHOLECYSTECTOMY WITH INTRAOPERATIVE CHOLANGIOGRAM;  Surgeon: Atilano Ina, MD,FACS;  Location: [facility] ORS;  Service: General;  Laterality: N/A;   COLONOSCOPY  02/2018   done for positive cologuard - WNL, rpt 10 yrs Russella Dar)  CYSTOSCOPY     ERCP  05/22/2012   Procedure: ENDOSCOPIC RETROGRADE CHOLANGIOPANCREATOGRAPHY (ERCP);  Surgeon: [name], MD,FACG;  Location: [facility];  Service: Endoscopy;  Laterality: N/A;   ERCP N/A 09/17/2013   Procedure: ENDOSCOPIC RETROGRADE CHOLANGIOPANCREATOGRAPHY (ERCP);  Surgeon: [name], MD;  Location: [facility];  Service: Endoscopy;  Laterality: N/A;   ERCP N/A 09/27/2018   Procedure: ENDOSCOPIC RETROGRADE CHOLANGIOPANCREATOGRAPHY (ERCP);  Surgeon: [name], MD;  Location: [facility];  Service: Endoscopy;  Laterality: N/A;   ESOPHAGOGASTRODUODENOSCOPY  09/2016   WNL. esophagus dilated Fuller Plan)    HEMORRHOID BANDING  09/23/2013   --Dr. Greer Pickerel   HERNIA REPAIR     inguinal   HYSTEROSCOPY W/ ENDOMETRIAL ABLATION     KNEE ARTHROSCOPY Right 04/06/2021   Procedure: RIGHT KNEE ARTHROSCOPY WITH DEBRIDEMENT;  Surgeon: [name], MD;  Location: [place];  Service: Orthopedics;  Laterality: Right;   NASAL SINUS SURGERY     x5   PARTIAL KNEE ARTHROPLASTY Right 06/01/2020   Procedure: Right knee patellofemoral replacement;  Surgeon: [name], MD;  Location: [place];  Service: Orthopedics;  Laterality: Right;   POSTERIOR CERVICAL FUSION/FORAMINOTOMY N/A 12/29/2021   Procedure: POSTERIOR CERVICAL FUSION CERVICAL THREE-SEVEN;  Surgeon: Newman Pies, MD;  Location: [place];  Service: Neurosurgery;  Laterality: N/A;   REMOVAL OF STONES  09/27/2018   Procedure: REMOVAL OF STONES;  Surgeon: [name], MD;  Location: [facility];  Service: Endoscopy;;   SPHINCTEROTOMY  09/27/2018   Procedure: SPHINCTEROTOMY;  Surgeon: [name], MD;  Location: [facility];  Service: Endoscopy;;   TONSILLECTOMY     removed as a child   TOTAL HIP ARTHROPLASTY Left 03/12/2019   Procedure: LEFT TOTAL HIP ARTHROPLASTY ANTERIOR APPROACH;  [name], [name], MD)   UPPER GASTROINTESTINAL ENDOSCOPY     Patient Active Problem List   Diagnosis Date Noted   Fusion of spine, cervical region    Anemia    Primary hypertension    Radiculopathy 01/07/2022   Cervical radiculopathy 12/29/2021   C6 cervical fracture ([place]) 12/29/2021   C5 cervical fracture ([place]) 12/21/2021   Advanced directives, counseling/discussion 09/23/2021   Strep throat 08/18/2021   Swelling of right index finger 07/14/2021   Synovitis of right knee    Scoliosis 01/13/2021   Osteopenia 07/02/2020   Status post left knee surgery 06/01/2020   Pulmonary nodule 05/27/2020   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   Chronic patellofemoral pain of right knee 05/05/2020    Positive ANA (antinuclear antibody) 06/05/2019   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   Unilateral primary osteoarthritis, left hip 01/28/2019   Pelvic pain 12/12/2018   Common bile duct (CBD) obstruction    Venous insufficiency of left lower extremity 07/04/2018   Dyslipidemia 09/22/2017   DDD (degenerative disc disease), cervical 04/03/2017   DDD (degenerative disc disease), lumbar 04/03/2017   Onychomycosis 03/21/2017   Dysphagia 10/18/2016   Encounter for chronic pain management 10/18/2016   Biliary stasis 09/26/2016   HNP (herniated nucleus pulposus), lumbar 09/23/2015   Iron deficiency 07/30/2015   Health maintenance examination 06/15/2015   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   Clavicle enlargement 12/13/2014   Prediabetes 12/13/2014   Plantar fasciitis, bilateral 09/11/2014   Medicare annual wellness visit, subsequent 06/10/2014   Abnormal thyroid function test 06/10/2014   Chronic pain syndrome 06/10/2014   CFS (chronic fatigue syndrome) 04/08/2014   Postmenopausal atrophic vaginitis 10/19/2012   Positive QuantiFERON-TB Gold test  02/07/2012   Cervical disc disorder with radiculopathy of cervical region 05/28/2010   APHTHOUS ULCERS 01/31/2008   Insomnia 11/09/2007   Constipation 10/11/2007   Allergic rhinitis 04/12/2007   MDD (major depressive disorder), recurrent episode, moderate ([place]) 03/05/2007   [name] syndrome 03/05/2007   GERD 03/05/2007   ROSACEA 03/05/2007   NEURALGIA 03/05/2007   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[place]) 12/06/2006   Chronic interstitial cystitis 12/06/2006   Fibromyalgia 12/06/2006    ONSET DATE: 01/12/2022 (Referral date), surgery 12/29/21 and revision on 12/30/21  REFERRING DIAG:  M54.10 (ICD-10-CM) - Radiculopathy, site unspecified  M43.22 (ICD-10-CM) - Fusion of spine, cervical region    THERAPY DIAG:  Other lack of coordination  Muscle weakness (generalized)  Unsteadiness on feet  Rationale for Evaluation and  Treatment Rehabilitation  SUBJECTIVE:   SUBJECTIVE STATEMENT: I did call the doctor to send over clarification on precautions.  In the last 6-7 days, I have had extreme sensitivity to hot/cold at thumb and index finger Lt hand only.   Pt accompanied by: self  PERTINENT HISTORY:  Chronic pain, [name] syndrome, GERD, cervical radiculopathy, fibromyalgia, scleroderma, herniated lumbar disc L4/L5, DDD, Partial right knee arthroplasty, scoliosis, Lt THA, interstitial cystitis From rehab admit note:  "ACDF 2004 C5-6 and C6-7 and 2017 C3-4 and C4-5, left total hip arthroplasty 2020, scleroderma.  Presented 12/29/2021 with noted history of fall 10/29/2021 suffering a C5-6 fracture subluxation/cervical kyphosis with persistent neck pain and initial conservative care with failed medical management.  Underwent 3 stage anterior posterior fusion C5-6 corpectomy, posterior fusion C3-7, ACDF C3-7 12/29/2021.  Postop she developed left hand weakness consistent with a C8 radiculopathy as well as small amount of epidural fluid at C5-6.  Cervical CT demonstrated that the left T1 pedicle screw was a bit low and into the neural foramen.  Patient underwent revision of posterior cervical instrumentation 12/30/2021 per Dr. Newman Pies.  Placed in a cervical hard collar when out of bed."   PRECAUTIONS: Cervical and Fall-cervical hard collar when OOB, NO shoulder ROM > 90* until further clarification from MD, no lifting    PAIN:  Are you having pain? Yes: NPRS scale: 2/10 Pain location: Neck and Lt arm Pain description: Lt tingling/numb and achy from elbow to hand, throbbing in Lt shoulder blade. Pt also reports intense pain w/ hot and cold Lt index and thumb Aggravating factors: dressing, exercise Relieving factors: rest, elevation, meds   PATIENT GOALS get Rt arm a little stronger, writing, and get Lt arm better and stronger  OBJECTIVE:   HAND DOMINANCE: Right   TODAY'S TREATMENT:  03/01/22   No  clarification on shoulder ROM or lifting restrictions have been received. Call placed to Dr. [name] office today from neuro rehab and voicemail left w/ Jacqlyn Larsen to get further clarification  Translating stress balls in Rt and Lt hand for in hand manipulation.  Rotating block and dii in numerical order b/t first 3 fingers for coordination/control in fingertips w/ mod difficulty. Pt unable to keep it in fingertips and slides down to palm.   Pt placing grooved pegs in pegboard and then removing w/ tweezers (1/2 with Rt hand, 1/2 with Lt hand)   Practiced braiding shoelace string for bilateral integration/coordination w/ min difficulty     HOME EXERCISE PROGRAM: 02/04/22 - yellow theraputty Access Code: [email or web address] 02/22/22: coordination HEP, upgraded to red putty HEP     GOALS: Goals reviewed with patient? Yes  SHORT TERM GOALS: Target date: 03/05/22  Independent w/ HEP for bilateral hand coordination  and strength (more focus on LUE)  Baseline: Goal status: MET  2.  Pt to write short paragraph maintaining 90% or greater legibility Baseline:  Goal status: MET  3.  Pt to cut food mod I w/ AE prn Baseline:  Goal status: MET reports cutting up food independently this week  4.  Pt to perform bathing w/ direct supervision only using AE prn Baseline:  Goal status: MET  5.  Pt to make simple snack/sandwich w/ supervision Baseline:  Goal status: MET   LONG TERM GOALS: Target date: 05/05/22  Independent w/ updated BUE strengthening HEP  Baseline:  Goal status: INITIAL  2.  Pt to improve coordination bilaterally as evidenced by performing 9 hole peg test in 23 sec or less Rt hand, and 27 sec or less Lt hand Baseline: Rt = 26.78 sec, Lt = 31.28 sec Goal status: INITIAL  3.  Pt to improve grip strength by 5 lbs Rt hand, by 10 lbs Lt hand Baseline: Rt = 46 lbs, Lt = 29 lbs Goal status: INITIAL  4.  Pt to return to simple stovetop cooking with supervision Baseline:  Goal status: IN  PROGRESS reports making spaghetti and other meals   5.  Pt to perform light IADLS (laundry, making bed, washing dishes) at mod I level Baseline:  Goal status: INITIAL   ASSESSMENT:  CLINICAL IMPRESSION: Pt met all STG's. Still unclear about precautions with lifting and AROM for shoulders however pt will call MD to find out since MD has not responded to telephone encounter.  PERFORMANCE DEFICITS in functional skills including ADLs, IADLs, coordination, dexterity, sensation, ROM, strength, pain, FMC, mobility, balance, body mechanics, endurance, decreased knowledge of precautions, decreased knowledge of use of DME, and UE functional use.   IMPAIRMENTS are limiting patient from ADLs, IADLs, leisure, and social participation.   COMORBIDITIES has co-morbidities such as scleroderma, Raynauds, scoliosis, OA  that affects occupational performance. Patient will benefit from skilled OT to address above impairments and improve overall function.  MODIFICATION OR ASSISTANCE TO COMPLETE EVALUATION: Min-Moderate modification of tasks or assist with assess necessary to complete an evaluation.  OT OCCUPATIONAL PROFILE AND HISTORY: Detailed assessment: Review of records and additional review of physical, cognitive, psychosocial history related to current functional performance.  CLINICAL DECISION MAKING: Moderate - several treatment options, min-mod task modification necessary  REHAB POTENTIAL: Good  EVALUATION COMPLEXITY: Moderate    PLAN: OT FREQUENCY: 2x/week  OT DURATION: 12 weeks (anticipate only 8-10 weeks needed)   PLANNED INTERVENTIONS: self care/ADL training, therapeutic exercise, therapeutic activity, neuromuscular re-education, passive range of motion, functional mobility training, splinting, paraffin, fluidotherapy, moist heat, cryotherapy, patient/family education, energy conservation, coping strategies training, DME and/or AE instructions, and Re-evaluation  RECOMMENDED OTHER  SERVICES: NONE  CONSULTED AND AGREED WITH PLAN OF CARE: Patient  PLAN FOR NEXT SESSION: check on clarification of precautions, progress towards LTG's.    Hans Eden, OT 03/01/2022, 12:42 PM

## 2022-03-02 MED ORDER — LINACLOTIDE 290 MCG PO CAPS: 290.0000 ug | ORAL_CAPSULE | Freq: Every day | ORAL | 6 refills | Status: DC

## 2022-03-02 NOTE — Telephone Encounter (Signed)
ERx 

## 2022-03-04 ENCOUNTER — Ambulatory Visit: Payer: Medicare Other | Admitting: Physical Therapy

## 2022-03-04 ENCOUNTER — Encounter: Payer: Medicare Other | Admitting: Occupational Therapy

## 2022-03-08 ENCOUNTER — Ambulatory Visit: Payer: Medicare Other | Admitting: Occupational Therapy

## 2022-03-08 ENCOUNTER — Ambulatory Visit: Payer: Medicare Other | Admitting: Physical Therapy

## 2022-03-08 ENCOUNTER — Encounter: Payer: Self-pay | Admitting: Physical Therapy

## 2022-03-08 DIAGNOSIS — R2689 Other abnormalities of gait and mobility: Secondary | ICD-10-CM

## 2022-03-08 DIAGNOSIS — R2681 Unsteadiness on feet: Secondary | ICD-10-CM

## 2022-03-08 DIAGNOSIS — R208 Other disturbances of skin sensation: Secondary | ICD-10-CM

## 2022-03-08 DIAGNOSIS — R278 Other lack of coordination: Secondary | ICD-10-CM

## 2022-03-08 DIAGNOSIS — M79602 Pain in left arm: Secondary | ICD-10-CM

## 2022-03-08 DIAGNOSIS — M6281 Muscle weakness (generalized): Secondary | ICD-10-CM | POA: Diagnosis not present

## 2022-03-08 DIAGNOSIS — M25661 Stiffness of right knee, not elsewhere classified: Secondary | ICD-10-CM

## 2022-03-08 NOTE — Patient Instructions (Addendum)
Dr. [name],  Rachel Duran is receiving PT/OT at our site. Please clarify updated precautions, so we can better provide care.   How much longer will she need to wear cervical collar?(Can she go without it in therapy?)  When can she begin overhead reaching?  When can we begin low level strengthening for UE's (please specify lbs)?  Is she limited in bending, lifting and twisting?  Does she have continued cervical ROM restrictions?  Thanks so much,  [employer], OTR/L Elease Etienne, DPT

## 2022-03-08 NOTE — Therapy (Signed)
OUTPATIENT PHYSICAL THERAPY NEURO TREATMENT   Patient Name: Rachel Duran MRN: [id number] DOB:1956/07/23, 66 y.o., female Today's Date: 03/08/2022   PCP: Ria Bush, MD REFERRING PROVIDER: Courtney Heys, MD    PT End of Session - 03/08/22 1317     Visit Number 8    Number of Visits 19   18 + eval   Date for PT Re-Evaluation 04/29/22   pushed out for scheduling   Authorization Type [employer]ICARE    Progress Note Due on Visit 10    PT Start Time 1316    PT Stop Time 1358    PT Time Calculation (min) 42 min    Equipment Utilized During Treatment Gait belt;Cervical collar    Activity Tolerance Patient tolerated treatment well    Behavior During Therapy WFL for tasks assessed/performed                Past Medical History:  Diagnosis Date   Abdominal pain last 4 months   and nausea also   Allergy    Anemia    history of   Anxiety    Bipolar disorder ([place])    atpical bipolar disorder   Cervical disc disease limited rom turning to left   hx. C6- C7 -hx. past fusion(bone graft used)   Cholecystitis    Chronic pain    DDD (degenerative disc disease), lumbar 09/2015   dextroscoliosis with multilevel DDD and facet arthrosis most notable for R foraminal disc protrusion L4/5 producing severe R neural foraminal stenosis abutting R L4 nerve root, moderate spinal canal and mild lat recesss and R neural foraminal stenosis L3/4 (MRI)   Depression    bipolar depression   Disorders of porphyrin metabolism    Felon of finger of left hand 11/10/2016   Fibromyalgia    GERD (gastroesophageal reflux disease)    Headache    occasionally   Internal hemorrhoids    Interstitial cystitis 06/06/2012   hx.   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 (Dr Baird Cancer retinologist)   Tuberculosis    Past Surgical History:  Procedure Laterality Date   ANTERIOR CERVICAL DECOMP/DISCECTOMY FUSION  2004   C5/6, C6/7   ANTERIOR CERVICAL DECOMP/DISCECTOMY FUSION  02/2016   C3/4, C4/5 with plating [name])   ANTERIOR CERVICAL DECOMP/DISCECTOMY FUSION  12/29/2021   Procedure: ANTERIOR CERVICAL DECOMPRESSION/ DISCECTOMY FUSION CERVICAL THREE - SEVEN;  Surgeon: Newman Pies, MD;  Location: [place];  Service: Neurosurgery;;   ANTERIOR CERVICAL DECOMPRESSION/DISCECTOMY FUSION 4 LEVELS N/A 12/29/2021   Procedure: CERVICAL FIVE-SIX CORPECTOMY,;  Surgeon: Newman Pies, MD;  Location: [place];  Service: Neurosurgery;  Laterality: N/A;   AUGMENTATION MAMMAPLASTY Bilateral 03/25/2010   BREAST ENHANCEMENT SURGERY  2010   BREAST IMPLANT EXCHANGE  10/2014   exchange saline implants, B mastopexy/capsulorraphy ([facility])   BUNIONECTOMY Bilateral yrs ago   [place]   x 1   CHOLECYSTECTOMY  06/11/2012   Procedure: LAPAROSCOPIC CHOLECYSTECTOMY WITH INTRAOPERATIVE CHOLANGIOGRAM;  Surgeon:  [name], MD,FACS;  Location: [facility];  Service: General;  Laterality: N/A;   COLONOSCOPY  02/2018   done for positive cologuard - WNL, rpt 10 yrs [facility])   CYSTOSCOPY     ERCP  05/22/2012   Procedure: ENDOSCOPIC RETROGRADE CHOLANGIOPANCREATOGRAPHY (ERCP);  Surgeon: [name], MD,FACG;  Location: [facility];  Service: Endoscopy;  Laterality: N/A;   ERCP N/A 09/17/2013   Procedure: ENDOSCOPIC RETROGRADE CHOLANGIOPANCREATOGRAPHY (ERCP);  Surgeon: [name], MD;  Location: [facility];  Service: Endoscopy;  Laterality: N/A;   ERCP N/A 09/27/2018   Procedure: ENDOSCOPIC RETROGRADE CHOLANGIOPANCREATOGRAPHY (ERCP);   Surgeon: [name], MD;  Location: [facility];  Service: Endoscopy;  Laterality: N/A;   ESOPHAGOGASTRODUODENOSCOPY  09/2016   WNL. esophagus dilated [facility])   HEMORRHOID BANDING  09/23/2013   --Dr. Greer Pickerel   HERNIA REPAIR     inguinal   HYSTEROSCOPY W/ ENDOMETRIAL ABLATION     KNEE ARTHROSCOPY Right 04/06/2021   Procedure: RIGHT KNEE ARTHROSCOPY WITH DEBRIDEMENT;  Surgeon: [name], MD;  Location: [place];  Service: Orthopedics;  Laterality: Right;   NASAL SINUS SURGERY     x5   PARTIAL KNEE ARTHROPLASTY Right 06/01/2020   Procedure: Right knee patellofemoral replacement;  Surgeon: [name], MD;  Location: [place];  Service: Orthopedics;  Laterality: Right;   POSTERIOR CERVICAL FUSION/FORAMINOTOMY N/A 12/29/2021   Procedure: POSTERIOR CERVICAL FUSION CERVICAL THREE-SEVEN;  Surgeon: Newman Pies, MD;  Location: [place];  Service: Neurosurgery;  Laterality: N/A;   REMOVAL OF STONES  09/27/2018   Procedure: REMOVAL OF STONES;  Surgeon: [name], MD;  Location[facility];  Service: Endoscopy;;   SPHINCTEROTOMY  09/27/2018   Procedure: SPHINCTEROTOMY;  Surgeon: [name], MD;  [facility];  Service: Endoscopy;;   TONSILLECTOMY     removed as a child   TOTAL HIP ARTHROPLASTY Left 03/12/2019   Procedure: LEFT TOTAL HIP ARTHROPLASTY ANTERIOR APPROACH;  [name], [name], MD)   UPPER GASTROINTESTINAL ENDOSCOPY     Patient Active Problem List   Diagnosis Date Noted   Fusion of spine, cervical region    Anemia    Primary hypertension    Radiculopathy 01/07/2022   Cervical radiculopathy 12/29/2021   C6 cervical fracture ([place]) 12/29/2021   C5 cervical fracture ([place]) 12/21/2021   Advanced directives, counseling/discussion 09/23/2021   Strep throat 08/18/2021   Swelling of right index finger 07/14/2021   Synovitis of right knee    Scoliosis 01/13/2021   Osteopenia 07/02/2020   Status  post left knee surgery 06/01/2020   Pulmonary nodule 05/27/2020   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   Chronic patellofemoral pain of right knee 05/05/2020   Positive ANA (antinuclear antibody) 06/05/2019   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   Unilateral primary osteoarthritis, left hip 01/28/2019   Pelvic pain 12/12/2018   Common bile duct (CBD) obstruction    Venous insufficiency of left lower extremity 07/04/2018   Dyslipidemia 09/22/2017   DDD (degenerative disc disease), cervical 04/03/2017   DDD (degenerative disc disease), lumbar 04/03/2017   Onychomycosis 03/21/2017   Dysphagia 10/18/2016   Encounter for chronic pain management 10/18/2016   Biliary stasis 09/26/2016   HNP (herniated nucleus pulposus), lumbar 09/23/2015   Iron deficiency 07/30/2015   Health maintenance examination 06/15/2015   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   Clavicle enlargement 12/13/2014   Prediabetes 12/13/2014   Plantar fasciitis, bilateral 09/11/2014   Medicare annual wellness visit, subsequent  06/10/2014   Abnormal thyroid function test 06/10/2014   Chronic pain syndrome 06/10/2014   CFS (chronic fatigue syndrome) 04/08/2014   Postmenopausal atrophic vaginitis 10/19/2012   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   Cervical disc disorder with radiculopathy of cervical region 05/28/2010   APHTHOUS ULCERS 01/31/2008   Insomnia 11/09/2007   Constipation 10/11/2007   Allergic rhinitis 04/12/2007   MDD (major depressive disorder), recurrent episode, moderate ([place]) 03/05/2007   [name] syndrome 03/05/2007   GERD 03/05/2007   ROSACEA 03/05/2007   NEURALGIA 03/05/2007   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[place]) 12/06/2006   Chronic interstitial cystitis 12/06/2006   Fibromyalgia 12/06/2006    ONSET DATE: 01/12/2022 (referral)  REFERRING DIAG: M54.10 (ICD-10-CM) - Radiculopathy, site unspecified M43.22 (ICD-10-CM) - Fusion of spine, cervical region   THERAPY DIAG:   Other lack of coordination  Muscle weakness (generalized)  Unsteadiness on feet  Other abnormalities of gait and mobility  Rationale for Evaluation and Treatment Rehabilitation   PERTINENT HISTORY: Chronic pain, [name] syndrome, GERD, cervical radiculopathy, fibromyalgia, hereditary macular degeneration (she has most trouble with peripheral vision), herniated lumbar disc L4/L5, DDD, Partial right knee arthroplasty, scoliosis  From rehab admit note:  "ACDF 2004 C5-6 and C6-7 and 2017 C3-4 and C4-5, left total hip arthroplasty 2020, scleroderma.  Presented 12/29/2021 with noted history of fall 10/29/2021 suffering a C5-6 fracture subluxation/cervical kyphosis with persistent neck pain and initial conservative care with failed medical management.  Underwent 3 stage anterior posterior fusion C5-6 corpectomy, posterior fusion C3-7, ACDF C3-7 12/29/2021.  Postop she developed left hand weakness consistent with a C8 radiculopathy as well as small amount of epidural fluid at C5-6.  Cervical CT demonstrated that the left T1 pedicle screw was a bit low and into the neural foramen.  Patient underwent revision of posterior cervical instrumentation 12/30/2021 per Dr. Newman Pies.  Placed in a cervical hard collar when out of bed."    PRECAUTIONS: Cervical and Fall-cervical hard collar when OOB   SUBJECTIVE: Pt denies falls or near misses, but states she still feels like she is walking different when doing morning walk with husband.  She states she has an appt with Dr. [name] first thing in the morning.  The HEP has been going good, the glut ones are challenging.                                                                                                                                                                                            PAIN:  Are you having pain? Yes: NPRS scale: 2/10 Pain location: left arm and hand, mildly in the right knee Pain description: N/T  w/ hypersensitivity in left  thumb and forefinger.  Knee feels tight. Aggravating factors: touching cold/hot Relieving factors: walking (for knee)  TODAY'S TREATMENT:  -backwards walking w/ CGA 2x25' no UE support w/ difficulty w/ foot placement -Tandem walking x25' w/ CGA-minA no UE support  -Regressed exercise to // bars w/ progressively dec UE support from BUE to relaxed no UE posture 7x8' both tandem and backwards walking using mirror to visualize and self-correct foot placement -added countertop version to HEP. -Lateral stepping on foam beam 6x8' progressed from BUE support to none, cued for relaxed upper trap -Attempted lateral stepping on foam beam with cone taps, pt able to complete x1 w/ min-mod difficulty locating cones due to limited cervical ROM in collar, activity d/c'd -Pt stands on airex FT performing functional reaching to each side to retrieve bean bags and toss to midline target > standing alt semi-tandem completing same bean bag activity  PATIENT EDUCATION: Education details: Continue walking w/  supervision from spouse.  Additions to HEP.  Reviewed note provided by OT on behalf of PT/OT for Dr. [name] to provide clarifications going forward at scheduled visit tomorrow AM. Person educated: Patient and Spouse Education method: Explanation Education comprehension: verbalized understanding  HOME EXERCISE PROGRAM: Access Code: [email or web address]  GOALS: Goals reviewed with patient? Yes  SHORT TERM GOALS: Target date: 02/18/2022  Pt will be independent with strength and balance HEP with supervision from family as needed. Baseline:  Established, pt compliant, would benefit from progressions. Goal status: MET  2.  Pt will decrease 5xSTS to </=17 seconds w/o UE support in order to demonstrate decreased risk for falls and improved functional bilateral LE strength and power. Baseline: 20.81sec w/ hands on knees; 02/22/2022 13.37sec w/o UE support Goal status: MET  3.  Pt will demonstrate TUG of </=12 seconds in  order to decrease risk of falls and improve functional mobility using LRAD. Baseline: 14.20 sec w/ RW; 02/22/2022 w/o SPC 8.19sec Goal status: MET  4.  Pt will increase BERG balance score to 40/56 to demonstrate improved static balance. Baseline: 36/56; 02/22/2022 50/56 Goal status: MET  5.  Pt will ambulate >/= 1000' feet w/ LRAD on 6MWT to demonstrate improved functional endurance for home and community participation. Baseline: 900' w/ RW; 756' w/ SPC Goal status: IN PROGRESS  6.  Pt will report no pain at rest and during static standing activities to promote improved activity tolerance. Baseline: 3/10; 02/22/2022 R knee pain 3/10  Goal status:  NOT MET  LONG TERM GOALS: Target date: 03/18/2022 (8 weeks); 04/15/2022 (12 weeks)  Pt will report return to neighborhood walking 3 days per week to promote return to prior level of function. Baseline:  Pt not walking in neighborhood due to balance. Goal status: INITIAL  2.  Pt will decrease 5xSTS to </=13 seconds w/o UE support in order to demonstrate decreased risk for falls and improved functional bilateral LE strength and power. Baseline: 20.81sec w/ hands on knees; 02/22/2022 13.37sec w/o UE  Goal status: INITIAL  3.  Pt will increase BERG balance score to >/=55/56 to demonstrate improved static balance. Baseline: 36/56; 02/22/2022 50/56 Goal status: REVISED  4.  Pt will ambulate >/= 1000 feet w/ LRAD on 6MWT to demonstrate improved functional endurance for home and community participation. Baseline:  900' w/ RW Goal status: REVISED  5.  Pt will ambulate >/=1000 feet on various indoor and outdoor surfaces with LRAD and modified independent level of assist to promote household and community access. Baseline: No distance formally  calculated on eval. Goal status: INITIAL  6.  Pt will negotiate 14 stairs w/o UE support and reciprocal steps up and down independently to promote improved safety and independence within home environment. Baseline:   Pt demonstrates hesitancy with descending stairs. Goal status: INITIAL  ASSESSMENT:  CLINICAL IMPRESSION: Continued to challenge ankle strategy on compliant surfaces this session with pt tolerating all activities well.  She continues to ambulate safely on indoor level surfaces without AD, but reports some caution when outside.  Will address outdoor ambulation and balance strategies w/o AD in a coming session, LTGs due next session.  Pt is making excellent progress towards overall LTGs and will be appropriate for higher level balance assessment when collar removed and restrictions lifted.  She is anticipated to continue making progress with skilled PT POC.  OBJECTIVE IMPAIRMENTS Abnormal gait, decreased activity tolerance, decreased balance, decreased endurance, decreased mobility, difficulty walking, decreased strength, impaired UE functional use, and pain.   ACTIVITY LIMITATIONS carrying, lifting, bending, squatting, and stairs  PARTICIPATION LIMITATIONS: driving, community activity, and yard work  PERSONAL FACTORS Age, Fitness, Past/current experiences, Time since onset of injury/illness/exacerbation, and 1-2 comorbidities: chronic pain and DDD  are also affecting patient's functional outcome.   REHAB POTENTIAL: Good  CLINICAL DECISION MAKING: Evolving/moderate complexity  EVALUATION COMPLEXITY: Moderate  PLAN: PT FREQUENCY:  1x/wk for 12 weeks (Discussed pt progress with pt and limitations in husband's schedule due to upcoming school year and dropping to 1x/wk for remainder of visits-was previously 2x/wk for 6 wks followed by 1x/wk for 6 wks)  PT DURATION: 12 weeks (cert 14 weeks - 33/08/9516)  PLANNED INTERVENTIONS: Therapeutic exercises, Therapeutic activity, Neuromuscular re-education, Balance training, Gait training, Patient/Family education, Joint mobilization, Stair training, Vestibular training, DME instructions, Cryotherapy, Moist heat, Manual therapy, and Re-evaluation  PLAN  FOR NEXT SESSION:  Did OT/PT get clarification letter signed from Dr. [name]?  Assess LTGs -update to include FGA if no collar.  Pt will be appropriate for FGA once collar is removed and restrictions lifted.  Update HEP prn - BLE strengthening (R glut/quad) and dynamic balance - bckwds/tandem walking.  Foam beam, bean bag toss on airex, reaching outside BOS.  Gait training w/ no AD.  Stair management.  SLS-step taps up to 4th stair w/ airex.  Tilt board.  Treadmill training w/ incline.  Outside balance on grass/hill-pt has personal goals of going to grasshoppers game and walking dogs independently.    Elease Etienne, PT, DPT Outpatient Neuro [facility] [address] [place], [place] [address] [phone] 03/08/22, 2:33 PM

## 2022-03-08 NOTE — Patient Instructions (Signed)
Access Code: [email or web address] URL: [email or web address] Date: 03/08/2022 Prepared by: Elease Etienne  Exercises - Standing Single Leg Stance with Counter Support  - 1 x daily - 5 x weekly - 1 sets - 3-4 reps - 30 seconds hold - Forward Step Up with Counter Support  - 1 x daily - 5 x weekly - 2 sets - 15 reps - Lateral Step Up with Counter Support  - 1 x daily - 5 x weekly - 2 sets - 10 reps - Side Stepping with Resistance at Thighs and Counter Support  - 1 x daily - 5 x weekly - 2 sets - 10 reps - Standing Hip Abduction with Counter Support  - 1 x daily - 5 x weekly - 2 sets - 10 reps - Standing Hip Extension with Counter Support  - 1 x daily - 5 x weekly - 2 sets - 10 reps - Tandem Walking with Counter Support  - 1 x daily - 4-5 x weekly - 3 sets - 10 reps - Backward Walking with Counter Support  - 1 x daily - 4-5 x weekly - 3 sets - 10 reps

## 2022-03-08 NOTE — Therapy (Addendum)
OUTPATIENT OCCUPATIONAL THERAPY NEURO TREATMENT  Patient Name: Rachel Duran MRN: [id number] DOB:January 12, 1956, 66 y.o., female Today's Date: 03/08/2022  PCP: Ria Bush, MD  REFERRING PROVIDER: Courtney Heys, MD    OT End of Session - 03/08/22 1550     Visit Number 7    Number of Visits 17    Date for OT Re-Evaluation 05/05/22    Authorization Type [employer] MCR    OT Start Time 1236    OT Stop Time 1315    OT Time Calculation (min) 39 min    Activity Tolerance Patient tolerated treatment well    Behavior During Therapy WFL for tasks assessed/performed              Past Medical History:  Diagnosis Date   Abdominal pain last 4 months   and nausea also   Allergy    Anemia    history of   Anxiety    Bipolar disorder ([place])    atpical bipolar disorder   Cervical disc disease limited rom turning to left   hx. C6- C7 -hx. past fusion(bone graft used)   Cholecystitis    Chronic pain    DDD (degenerative disc disease), lumbar 09/2015   dextroscoliosis with multilevel DDD and facet arthrosis most notable for R foraminal disc protrusion L4/5 producing severe R neural foraminal stenosis abutting R L4 nerve root, moderate spinal canal and mild lat recesss and R neural foraminal stenosis L3/4 (MRI)   Depression    bipolar depression   Disorders of porphyrin metabolism    Felon of finger of left hand 11/10/2016   Fibromyalgia    GERD (gastroesophageal reflux disease)    Headache    occasionally   Internal hemorrhoids    Interstitial cystitis 06/06/2012   hx.   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 (Dr Baird Cancer retinologist)   Tuberculosis    Past Surgical History:  Procedure Laterality Date   ANTERIOR CERVICAL DECOMP/DISCECTOMY FUSION  2004   C5/6, C6/7   ANTERIOR CERVICAL DECOMP/DISCECTOMY FUSION  02/2016   C3/4, C4/5 with plating [name])   ANTERIOR CERVICAL DECOMP/DISCECTOMY FUSION  12/29/2021   Procedure: ANTERIOR CERVICAL DECOMPRESSION/ DISCECTOMY FUSION CERVICAL THREE - SEVEN;  Surgeon: Newman Pies, MD;  Location: [place];  Service: Neurosurgery;;   ANTERIOR CERVICAL DECOMPRESSION/DISCECTOMY FUSION 4 LEVELS N/A 12/29/2021   Procedure: CERVICAL FIVE-SIX CORPECTOMY,;  Surgeon: Newman Pies, MD;  Location: [place];  Service: Neurosurgery;  Laterality: N/A;   AUGMENTATION MAMMAPLASTY Bilateral 03/25/2010   BREAST ENHANCEMENT SURGERY  2010   BREAST IMPLANT EXCHANGE  10/2014   exchange saline implants, B mastopexy/capsulorraphy ([facility])   BUNIONECTOMY Bilateral yrs ago   [place]   x 1   CHOLECYSTECTOMY  06/11/2012   Procedure: LAPAROSCOPIC CHOLECYSTECTOMY WITH INTRAOPERATIVE CHOLANGIOGRAM;  Surgeon: [name], MD,FACS;  Location: [facility];  Service: General;  Laterality: N/A;   COLONOSCOPY  02/2018   done for positive cologuard - WNL, rpt 10 yrs Fuller Plan)  CYSTOSCOPY     ERCP  05/22/2012   Procedure: ENDOSCOPIC RETROGRADE CHOLANGIOPANCREATOGRAPHY (ERCP);  Surgeon: [name], MD,FACG;  Location: [facility];  Service: Endoscopy;  Laterality: N/A;   ERCP N/A 09/17/2013   Procedure: ENDOSCOPIC RETROGRADE CHOLANGIOPANCREATOGRAPHY (ERCP);  Surgeon: [name], MD;  Location: [facility];  Service: Endoscopy;  Laterality: N/A;   ERCP N/A 09/27/2018   Procedure: ENDOSCOPIC RETROGRADE CHOLANGIOPANCREATOGRAPHY (ERCP);  Surgeon: [name], MD;  Location: [facility];  Service: Endoscopy;  Laterality: N/A;   ESOPHAGOGASTRODUODENOSCOPY  09/2016   WNL. esophagus dilated  Fuller Plan)   HEMORRHOID BANDING  09/23/2013   --Dr. Greer Pickerel   HERNIA REPAIR     inguinal   HYSTEROSCOPY W/ ENDOMETRIAL ABLATION     KNEE ARTHROSCOPY Right 04/06/2021   Procedure: RIGHT KNEE ARTHROSCOPY WITH DEBRIDEMENT;  Surgeon: [name], MD;  Location: [place];  Service: Orthopedics;  Laterality: Right;   NASAL SINUS SURGERY     x5   PARTIAL KNEE ARTHROPLASTY Right 06/01/2020   Procedure: Right knee patellofemoral replacement;  Surgeon: [name], MD;  Location: [place];  Service: Orthopedics;  Laterality: Right;   POSTERIOR CERVICAL FUSION/FORAMINOTOMY N/A 12/29/2021   Procedure: POSTERIOR CERVICAL FUSION CERVICAL THREE-SEVEN;  Surgeon: Newman Pies, MD;  Location: [place];  Service: Neurosurgery;  Laterality: N/A;   REMOVAL OF STONES  09/27/2018   Procedure: REMOVAL OF STONES;  Surgeon: [name], MD;  Location: [facility];  Service: Endoscopy;;   SPHINCTEROTOMY  09/27/2018   Procedure: SPHINCTEROTOMY;  Surgeon: [name], MD;  Location[facility];  Service: Endoscopy;;   TONSILLECTOMY     removed as a child   TOTAL HIP ARTHROPLASTY Left 03/12/2019   Procedure: LEFT TOTAL HIP ARTHROPLASTY ANTERIOR APPROACH;  [name], [name], MD)   UPPER GASTROINTESTINAL ENDOSCOPY     Patient Active Problem List   Diagnosis Date Noted   Fusion of spine, cervical region    Anemia    Primary hypertension    Radiculopathy 01/07/2022   Cervical radiculopathy 12/29/2021   C6 cervical fracture ([place]) 12/29/2021   C5 cervical fracture ([place]) 12/21/2021   Advanced directives, counseling/discussion 09/23/2021   Strep throat 08/18/2021   Swelling of right index finger 07/14/2021   Synovitis of right knee    Scoliosis 01/13/2021   Osteopenia 07/02/2020   Status post left knee surgery 06/01/2020   Pulmonary nodule 05/27/2020   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   Chronic patellofemoral pain of right knee  05/05/2020   Positive ANA (antinuclear antibody) 06/05/2019   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   Unilateral primary osteoarthritis, left hip 01/28/2019   Pelvic pain 12/12/2018   Common bile duct (CBD) obstruction    Venous insufficiency of left lower extremity 07/04/2018   Dyslipidemia 09/22/2017   DDD (degenerative disc disease), cervical 04/03/2017   DDD (degenerative disc disease), lumbar 04/03/2017   Onychomycosis 03/21/2017   Dysphagia 10/18/2016   Encounter for chronic pain management 10/18/2016   Biliary stasis 09/26/2016   HNP (herniated nucleus pulposus), lumbar 09/23/2015   Iron deficiency 07/30/2015   Health maintenance examination 06/15/2015   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   Clavicle enlargement 12/13/2014   Prediabetes 12/13/2014   Plantar fasciitis, bilateral 09/11/2014   Medicare annual wellness visit, subsequent 06/10/2014   Abnormal thyroid function test 06/10/2014   Chronic pain syndrome 06/10/2014   CFS (chronic fatigue syndrome) 04/08/2014   Postmenopausal atrophic vaginitis 10/19/2012   Positive QuantiFERON-TB Gold test  02/07/2012   Cervical disc disorder with radiculopathy of cervical region 05/28/2010   APHTHOUS ULCERS 01/31/2008   Insomnia 11/09/2007   Constipation 10/11/2007   Allergic rhinitis 04/12/2007   MDD (major depressive disorder), recurrent episode, moderate ([place]) 03/05/2007   [name] syndrome 03/05/2007   GERD 03/05/2007   ROSACEA 03/05/2007   NEURALGIA 03/05/2007   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[place]) 12/06/2006   Chronic interstitial cystitis 12/06/2006   Fibromyalgia 12/06/2006    ONSET DATE: 01/12/2022 (Referral date), surgery 12/29/21 and revision on 12/30/21  REFERRING DIAG:  M54.10 (ICD-10-CM) - Radiculopathy, site unspecified  M43.22 (ICD-10-CM) - Fusion of spine, cervical region    THERAPY DIAG:  Other lack of coordination  Muscle weakness (generalized)  Unsteadiness on feet  Other abnormalities  of gait and mobility  Other disturbances of skin sensation  Pain in left arm  Stiffness of right knee, not elsewhere classified  Rationale for Evaluation and Treatment Rehabilitation  SUBJECTIVE:   SUBJECTIVE STATEMENT: I see the doctor on Friday In the last 6-7 days, I have had extreme sensitivity to hot/cold at thumb and index finger Lt hand only.   Pt accompanied by: self  PERTINENT HISTORY:  Chronic pain, [name] syndrome, GERD, cervical radiculopathy, fibromyalgia, scleroderma, herniated lumbar disc L4/L5, DDD, Partial right knee arthroplasty, scoliosis, Lt THA, interstitial cystitis From rehab admit note:  "ACDF 2004 C5-6 and C6-7 and 2017 C3-4 and C4-5, left total hip arthroplasty 2020, scleroderma.  Presented 12/29/2021 with noted history of fall 10/29/2021 suffering a C5-6 fracture subluxation/cervical kyphosis with persistent neck pain and initial conservative care with failed medical management.  Underwent 3 stage anterior posterior fusion C5-6 corpectomy, posterior fusion C3-7, ACDF C3-7 12/29/2021.  Postop she developed left hand weakness consistent with a C8 radiculopathy as well as small amount of epidural fluid at C5-6.  Cervical CT demonstrated that the left T1 pedicle screw was a bit low and into the neural foramen.  Patient underwent revision of posterior cervical instrumentation 12/30/2021 per Dr. Newman Pies.  Placed in a cervical hard collar when out of bed."   PRECAUTIONS: Cervical and Fall-cervical hard collar when OOB, NO shoulder ROM > 90* until further clarification from MD, no lifting    PAIN:  Are you having pain? Yes: NPRS scale: 2/10 Pain location: Neck and Lt arm Pain description: Lt tingling/numb and achy from elbow to hand, throbbing in Lt shoulder blade. Pt also reports intense pain w/ hot and cold Lt index and thumb Aggravating factors: dressing, exercise Relieving factors: rest, elevation, meds   PATIENT GOALS get Rt arm a little stronger, writing,  and get Lt arm better and stronger  OBJECTIVE:   HAND DOMINANCE: Right   TODAY'S TREATMENT:  03/08/22   No clarification on shoulder ROM or lifting restrictions have been received. Note sent with patient take to  Dr. [name] office today from neuro rehab on Friday   Rotating block and dii in numerical order b/t first 3 fingers for coordination/control in fingertips w/ mod difficulty to match numbers on second dii.for LUE  Pt placing pegs in purdue pegboard with left and right UE's  for increased fine motor coordination, then removing with in han manipulation with LUE, min difficulty/ v.c  Standing to remove graded clothespins from vertical antennae from shoulder height and below for sustained pinch, min v.c  Gripper set at level 2 to pick up 1 inch blocks with left and right UE's, min difficulty and fatigue with LUE  Dealing playing cards with left and right UE's  min difficulty        HOME EXERCISE PROGRAM: 02/04/22 - yellow theraputty Access Code: [email or web address] 02/22/22: coordination HEP, upgraded to red putty HEP     GOALS: Goals reviewed with patient? Yes  SHORT TERM GOALS: Target date: 03/05/22  Independent w/ HEP for bilateral hand coordination and strength (more focus on LUE)  Baseline: Goal status: MET  2.  Pt to write short paragraph maintaining 90% or greater legibility Baseline:  Goal status: MET  3.  Pt to cut food mod I w/ AE prn Baseline:  Goal status: MET reports cutting up food independently this week  4.  Pt to perform bathing w/ direct supervision only using AE prn Baseline:  Goal status: MET  5.  Pt to make simple snack/sandwich w/ supervision Baseline:  Goal status: MET   LONG TERM GOALS: Target date: 05/05/22  Independent w/ updated BUE strengthening HEP  Baseline:  Goal status: INITIAL  2.  Pt to improve coordination bilaterally as evidenced by performing 9 hole peg test in 23 sec or less Rt hand, and 27 sec or less Lt hand Baseline: Rt  = 26.78 sec, Lt = 31.28 sec Goal status: INITIAL  3.  Pt to improve grip strength by 5 lbs Rt hand, by 10 lbs Lt hand Baseline: Rt = 46 lbs, Lt = 29 lbs Goal status: INITIAL  4.  Pt to return to simple stovetop cooking with supervision Baseline:  Goal status: IN PROGRESS reports making spaghetti and other meals   5.  Pt to perform light IADLS (laundry, making bed, washing dishes) at mod I level Baseline:  Goal status: INITIAL   ASSESSMENT:  CLINICAL IMPRESSION:  Pt is progressing towards goals for coordination. Note sent with patient to take to MD. PERFORMANCE DEFICITS in functional skills including ADLs, IADLs, coordination, dexterity, sensation, ROM, strength, pain, FMC, mobility, balance, body mechanics, endurance, decreased knowledge of precautions, decreased knowledge of use of DME, and UE functional use.   IMPAIRMENTS are limiting patient from ADLs, IADLs, leisure, and social participation.   COMORBIDITIES has co-morbidities such as scleroderma, Raynauds, scoliosis, OA  that affects occupational performance. Patient will benefit from skilled OT to address above impairments and improve overall function.  MODIFICATION OR ASSISTANCE TO COMPLETE EVALUATION: Min-Moderate modification of tasks or assist with assess necessary to complete an evaluation.  OT OCCUPATIONAL PROFILE AND HISTORY: Detailed assessment: Review of records and additional review of physical, cognitive, psychosocial history related to current functional performance.  CLINICAL DECISION MAKING: Moderate - several treatment options, min-mod task modification necessary  REHAB POTENTIAL: Good  EVALUATION COMPLEXITY: Moderate    PLAN: OT FREQUENCY: 2x/week  OT DURATION: 12 weeks (anticipate only 8-10 weeks needed)   PLANNED INTERVENTIONS: self care/ADL training, therapeutic exercise, therapeutic activity, neuromuscular re-education, passive range of motion, functional mobility training, splinting, paraffin,  fluidotherapy, moist heat, cryotherapy, patient/family education, energy conservation, coping strategies training, DME and/or AE instructions, and Re-evaluation  RECOMMENDED OTHER SERVICES: NONE  CONSULTED AND AGREED WITH PLAN OF CARE: Patient  PLAN FOR NEXT SESSION: check on clarification of precaution-pt sees MD this Friday, note sent with pt. Continue to work towards goals   Najah Liverman, OT 03/08/2022, 3:51 PM

## 2022-03-10 ENCOUNTER — Ambulatory Visit: Payer: Medicare Other | Admitting: Physical Therapy

## 2022-03-10 ENCOUNTER — Encounter: Payer: Medicare Other | Admitting: Occupational Therapy

## 2022-03-11 ENCOUNTER — Encounter: Payer: Medicare Other | Admitting: Occupational Therapy

## 2022-03-11 ENCOUNTER — Ambulatory Visit: Payer: Medicare Other | Admitting: Physical Therapy

## 2022-03-14 ENCOUNTER — Encounter: Payer: Self-pay | Admitting: Family Medicine

## 2022-03-14 ENCOUNTER — Ambulatory Visit (INDEPENDENT_AMBULATORY_CARE_PROVIDER_SITE_OTHER): Payer: Medicare Other | Admitting: Family Medicine

## 2022-03-14 ENCOUNTER — Encounter: Payer: Medicare Other | Attending: Registered Nurse | Admitting: Physical Medicine and Rehabilitation

## 2022-03-14 ENCOUNTER — Encounter: Payer: Self-pay | Admitting: Physical Medicine and Rehabilitation

## 2022-03-14 VITALS — BP 153/95 | HR 104 | Ht 62.0 in | Wt 123.0 lb

## 2022-03-14 VITALS — BP 126/70 | HR 107 | Temp 98.3°F | Resp 16 | Ht 62.0 in | Wt 123.0 lb

## 2022-03-14 DIAGNOSIS — M4322 Fusion of spine, cervical region: Secondary | ICD-10-CM | POA: Insufficient documentation

## 2022-03-14 DIAGNOSIS — M47812 Spondylosis without myelopathy or radiculopathy, cervical region: Secondary | ICD-10-CM | POA: Diagnosis present

## 2022-03-14 DIAGNOSIS — M792 Neuralgia and neuritis, unspecified: Secondary | ICD-10-CM | POA: Insufficient documentation

## 2022-03-14 DIAGNOSIS — M5412 Radiculopathy, cervical region: Secondary | ICD-10-CM | POA: Insufficient documentation

## 2022-03-14 DIAGNOSIS — S12490G Other displaced fracture of fifth cervical vertebra, subsequent encounter for fracture with delayed healing: Secondary | ICD-10-CM | POA: Insufficient documentation

## 2022-03-14 DIAGNOSIS — L03031 Cellulitis of right toe: Secondary | ICD-10-CM | POA: Diagnosis not present

## 2022-03-14 MED ORDER — LIDOCAINE 5 % EX OINT: 1.0000 | TOPICAL_OINTMENT | CUTANEOUS | 5 refills | Status: DC | PRN

## 2022-03-14 MED ORDER — DOXYCYCLINE HYCLATE 100 MG PO TABS: 100.0000 mg | ORAL_TABLET | Freq: Two times a day (BID) | ORAL | 0 refills | Status: AC

## 2022-03-14 MED ORDER — PREGABALIN 150 MG PO CAPS
150.0000 mg | ORAL_CAPSULE | Freq: Three times a day (TID) | ORAL | 1 refills | Status: DC
Start: 2022-03-14 — End: 2022-06-29

## 2022-03-14 NOTE — Progress Notes (Signed)
   Subjective:     Rachel Duran is a 66 y.o. female presenting for Toe Pain (Bug bite on right big toe X 2 weeks)     Toe Pain     #Red toe - started 2 weeks ago - thinks it was a bug bite - painful and throbbing - no injury - did start as an itchy insect bite - tried hydrocortisone and neosporin - no hx of gout - looked worse on Friday - has not taken medication directly - tylenol w/o improvement - worse with bending - no fevers or chills    Review of Systems   Social History   Tobacco Use  Smoking Status Never   Passive exposure: Never  Smokeless Tobacco Never        Objective:    BP Readings from Last 3 Encounters:  03/14/22 126/70  01/26/22 131/83  01/20/22 135/89   Wt Readings from Last 3 Encounters:  03/14/22 123 lb (55.8 kg)  01/26/22 131 lb (59.4 kg)  01/07/22 137 lb 12.6 oz (62.5 kg)    BP 126/70   Pulse (!) 107   Temp 98.3 F (36.8 C)   Resp 16   Ht '5\' 2"'$  (1.575 m)   Wt 123 lb (55.8 kg)   LMP 07/25/1998   SpO2 96%   BMI 22.50 kg/m    Physical Exam Constitutional:      General: She is not in acute distress.    Appearance: She is well-developed. She is not diaphoretic.  HENT:     Right Ear: External ear normal.     Left Ear: External ear normal.     Nose: Nose normal.  Eyes:     Conjunctiva/sclera: Conjunctivae normal.  Neck:     Comments: Wearing neck brace Cardiovascular:     Rate and Rhythm: Normal rate and regular rhythm.     Heart sounds: No murmur heard. Pulmonary:     Effort: Pulmonary effort is normal. No respiratory distress.     Breath sounds: Normal breath sounds. No wheezing.  Skin:    General: Skin is warm and dry.     Capillary Refill: Capillary refill takes less than 2 seconds.     Comments: Right great toe: Erythema and central small ulcer. Warm and ttp on anterior joint. No lateral joint ttp. Able to flex the toe  Neurological:     Mental Status: She is alert. Mental status is at baseline.   Psychiatric:        Mood and Affect: Mood normal.        Behavior: Behavior normal.           Assessment & Plan:   Problem List Items Addressed This Visit   None Visit Diagnoses     Cellulitis of great toe of right foot    -  Primary   Relevant Medications   doxycycline (VIBRA-TABS) 100 MG tablet      Discussed with the location it may be gout, however with central ulcer and initially itchy space suspect infected insect bite.  Treat with doxycycline, update if no improvement.  Will consider x-ray to evaluate for osteo-, but without any lateral joint line tenderness low suspicion for this.  Return if symptoms worsen or fail to improve.  Lesleigh Noe, MD

## 2022-03-14 NOTE — Patient Instructions (Signed)
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  Now having hypersensitivity and hyperalgesia.   Increase Lyrica/pregabalin to 150 mg in Am and 300 mg at night- maybe 1-2 hours before bedtime.   2. Get Home exercise program- from OT - and then restart after can get collar off in 1 month.    3. Can continue PT for balance and endurance right now.   4. Cannot do duloxetine- due to previous  bipolar dx.    5. Lidocaine ointment 5%- up to 4x/day as needed on L hand/etc- thin layer- and rub it in OR thicker layer and apply saran wrap over it.   6. Went over the cold feeling is due to lack of vasoconstriction of blood vessels so lose eat only in LUE- as compared ot over entire body- neurologically based.   7.  Went over prognosis- thinking 12 -18 months from original fracture, will likely hit maximal strength exam- can take longer for sensory return.   8. F/U in 3 months- call if any issues; give it 3-4 weeks, then give me a call to let me know how it's going pain wise.

## 2022-03-14 NOTE — Patient Instructions (Signed)
Toe infection - take antibiotic - update if worsening redness or no improvement within 48 hours - Update sooner if getting worse  Take with food, avoid sun or use protection

## 2022-03-14 NOTE — Progress Notes (Signed)
Subjective:    Patient ID: Rachel Duran, female    DOB: Jun 01, 1956, 66 y.o.   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   Things going really well.   Saw Dr [name] last week- and pleased with alignment- still released some restrictions, but cervical collar for 1 more month.   LUE weakness due to misplacement of screw per Dr [name], as per pt description.   LUE still giving her a lot of trouble.  L 1st/2nd digits very numb; hypersensitivity to cold/warmth. Also LUE cold to touch/feel from elbow down.   Numbness is about the same= working with PT and OT on  Grip strength has improved- adapting to use LUE -starting to cook now. Fine motor skills- still hard to improve.  And numbness/pins and needles, and prickly feeling never goes away. Worse from 2-3 pm to rest of night evening really bad.   Takes lyrica 150 mg BID-  Added Gabapentin 100 mg TID- since last past Friday.  Has bipolar d/o- so cannot do Duloxetine.  When has taken Lyrica 3x/day too sleepy.   Scheduled til mid September with OT- Can now do overhead with arms- no weights.  OT restricted until collar comes off.  PT is 1x/week right now- working on balance and endurance.   Walking 1/4 to 1/2 mile without cane with husband- holding onto 1 person. But otherwise, only using cane outside.  Walks with no AD in home- no furniture walking.  Stairs with husband to shadow her.    Pain Inventory Average Pain 4 Pain Right Now 2 My pain is constant, burning, and tingling  In the last 24 hours, has pain interfered with the following? General activity 4 Relation with others 0 Enjoyment of life 2 What TIME of day is your pain at its worst? evening and  night Sleep (in general) Good  Pain is worse with: walking and inactivity Pain improves with: rest and medication Relief from Meds: 6  Family History  Problem Relation Age of Onset   CAD Father 38       MI, nonsmoker   Esophageal cancer Father 2   Stomach cancer Father    Scleroderma Mother    Hypertension Mother    Esophageal cancer Paternal Grandfather    Stomach cancer Paternal Grandfather    Diabetes Maternal Grandmother    Arthritis Brother    Stroke Neg Hx    Colon cancer Neg Hx    Rectal cancer Neg Hx    Social History   Socioeconomic History   Marital status: Married    Spouse name: Not on file   Number of children: Not on file   Years of education: Not on file   Highest education level: Not on file  Occupational History   Occupation: retired    Fish farm manager: UNEMPLOYED  Tobacco Use   Smoking status: Never    Passive exposure: Never   Smokeless tobacco: Never  Vaping Use   Vaping Use: Never used  Substance and Sexual Activity   Alcohol use: No    Alcohol/week: 0.0 standard drinks of alcohol   Drug use: No   Sexual activity: Yes    Partners: Male    Birth control/protection: Post-menopausal    Comment:  vasectomy  Other Topics Concern   Not on file  Social History Narrative   Lives with husband and dog   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   Activity: tries to walk dog (45 min 3x/wk), yoga   Diet: good water, fruits/vegetables daily      Rheum: [name]   Psychiatrist: [facility]   Surgery: [facility]mond Pulling   [facility]   Urology: Amalia Hailey   Social Determinants of Health   Financial Resource Strain: Low Risk  (07/02/2019)   Overall Financial Resource Strain (CARDIA)    Difficulty of Paying Living Expenses: Not hard at all  Food Insecurity: No Food Insecurity (07/02/2019)   Hunger Vital Sign    Worried About Running Out of Food in the Last Year: Never true    Ran Out of Food in the Last Year: Never true  Transportation Needs:  No Transportation Needs (07/02/2019)   PRAPARE - Hydrologist (Medical): No    Lack of Transportation (Non-Medical): No  Physical Activity: Inactive (07/02/2019)   Exercise Vital Sign    Days of Exercise per Week: 0 days    Minutes of Exercise per Session: 0 min  Stress: Stress Concern Present (07/02/2019)   [place]    Feeling of Stress : Rather much  Social Connections: Not on file   Past Surgical History:  Procedure Laterality Date   ANTERIOR CERVICAL DECOMP/DISCECTOMY FUSION  2004   C5/6, C6/7   ANTERIOR CERVICAL DECOMP/DISCECTOMY FUSION  02/2016   C3/4, C4/5 with plating [name])   ANTERIOR CERVICAL DECOMP/DISCECTOMY FUSION  12/29/2021   Procedure: ANTERIOR CERVICAL DECOMPRESSION/ DISCECTOMY FUSION CERVICAL THREE - SEVEN;  Surgeon: Newman Pies, MD;  Location: [place];  Service: Neurosurgery;;   ANTERIOR CERVICAL DECOMPRESSION/DISCECTOMY FUSION 4 LEVELS N/A 12/29/2021   Procedure: CERVICAL FIVE-SIX CORPECTOMY,;  Surgeon: Newman Pies, MD;  Location: [place];  Service: Neurosurgery;  Laterality: N/A;   AUGMENTATION MAMMAPLASTY Bilateral 03/25/2010   BREAST ENHANCEMENT SURGERY  2010   BREAST IMPLANT EXCHANGE  10/2014   exchange saline implants, B mastopexy/capsulorraphy ([facility])   BUNIONECTOMY Bilateral yrs ago   [place]   x 1   CHOLECYSTECTOMY  06/11/2012   Procedure: LAPAROSCOPIC CHOLECYSTECTOMY WITH INTRAOPERATIVE CHOLANGIOGRAM;  Surgeon: [name], MD,FACS;  [facility];  Service: General;  Laterality: N/A;   COLONOSCOPY  02/2018   done for positive cologuard - WNL, rpt 10 yrs Fuller Plan)   CYSTOSCOPY     ERCP  05/22/2012   Procedure: ENDOSCOPIC RETROGRADE CHOLANGIOPANCREATOGRAPHY (ERCP);  Surgeon: [name], MD,FACG;  [facility];  Service: Endoscopy;  Laterality: N/A;   ERCP N/A 09/17/2013   Procedure: ENDOSCOPIC RETROGRADE  CHOLANGIOPANCREATOGRAPHY (ERCP);  Surgeon: [name], MD;  Location[facility];  Service: Endoscopy;  Laterality: N/A;   ERCP N/A 09/27/2018   Procedure: ENDOSCOPIC RETROGRADE CHOLANGIOPANCREATOGRAPHY (ERCP);  Surgeon: [name], MD;  Location: [facility];  Service: Endoscopy;  Laterality: N/A;   ESOPHAGOGASTRODUODENOSCOPY  09/2016   WNL. esophagus dilated [facility] Plan)   HEMORRHOID BANDING  09/23/2013   --Dr. Greer Pickerel   HERNIA REPAIR     inguinal   HYSTEROSCOPY W/ ENDOMETRIAL ABLATION     KNEE ARTHROSCOPY Right 04/06/2021   Procedure: RIGHT KNEE ARTHROSCOPY WITH DEBRIDEMENT;  Surgeon: [name], MD;  Location: [place];  Service: Orthopedics;  Laterality: Right;   NASAL SINUS SURGERY     x5  PARTIAL KNEE ARTHROPLASTY Right 06/01/2020   Procedure: Right knee patellofemoral replacement;  Surgeon: [name], MD;  Location: [place];  Service: Orthopedics;  Laterality: Right;   POSTERIOR CERVICAL FUSION/FORAMINOTOMY N/A 12/29/2021   Procedure: POSTERIOR CERVICAL FUSION CERVICAL THREE-SEVEN;  Surgeon: Newman Pies, MD;  Location: [place];  Service: Neurosurgery;  Laterality: N/A;   REMOVAL OF STONES  09/27/2018   Procedure: REMOVAL OF STONES;  Surgeon: [name], MD;  [facility];  Service: Endoscopy;;   SPHINCTEROTOMY  09/27/2018   Procedure: SPHINCTEROTOMY;  Surgeon: [name], MD;  [facility];  Service: Endoscopy;;   TONSILLECTOMY     removed as a child   TOTAL HIP ARTHROPLASTY Left 03/12/2019   Procedure: LEFT TOTAL HIP ARTHROPLASTY ANTERIOR APPROACH;  [name], [name], MD)   UPPER GASTROINTESTINAL ENDOSCOPY     Past Surgical History:  Procedure Laterality Date   ANTERIOR CERVICAL DECOMP/DISCECTOMY FUSION  2004   C5/6, C6/7   ANTERIOR CERVICAL DECOMP/DISCECTOMY FUSION  02/2016   C3/4, C4/5 with plating [name])   ANTERIOR CERVICAL DECOMP/DISCECTOMY FUSION  12/29/2021    Procedure: ANTERIOR CERVICAL DECOMPRESSION/ DISCECTOMY FUSION CERVICAL THREE - SEVEN;  Surgeon: Newman Pies, MD;  Location: [place];  Service: Neurosurgery;;   ANTERIOR CERVICAL DECOMPRESSION/DISCECTOMY FUSION 4 LEVELS N/A 12/29/2021   Procedure: CERVICAL FIVE-SIX CORPECTOMY,;  Surgeon: Newman Pies, MD;  Location: [place];  Service: Neurosurgery;  Laterality: N/A;   AUGMENTATION MAMMAPLASTY Bilateral 03/25/2010   BREAST ENHANCEMENT SURGERY  2010   BREAST IMPLANT EXCHANGE  10/2014   exchange saline implants, B mastopexy/capsulorraphy ([facility])   BUNIONECTOMY Bilateral yrs ago   [place]   x 1   CHOLECYSTECTOMY  06/11/2012   Procedure: LAPAROSCOPIC CHOLECYSTECTOMY WITH INTRAOPERATIVE CHOLANGIOGRAM;  Surgeon: [name], MD,FACS;  [facility];  Service: General;  Laterality: N/A;   COLONOSCOPY  02/2018   done for positive cologuard - WNL, rpt 10 yrs [facility] Plan)   CYSTOSCOPY     ERCP  05/22/2012   Procedure: ENDOSCOPIC RETROGRADE CHOLANGIOPANCREATOGRAPHY (ERCP);  Surgeon: [name], MD,FACG;  [facility];  Service: Endoscopy;  Laterality: N/A;   ERCP N/A 09/17/2013   Procedure: ENDOSCOPIC RETROGRADE CHOLANGIOPANCREATOGRAPHY (ERCP);  Surgeon: [name], MD;  [facility];  Service: Endoscopy;  Laterality: N/A;   ERCP N/A 09/27/2018   Procedure: ENDOSCOPIC RETROGRADE CHOLANGIOPANCREATOGRAPHY (ERCP);  Surgeon: [name], MD;  [facility];  Service: Endoscopy;  Laterality: N/A;   ESOPHAGOGASTRODUODENOSCOPY  09/2016   WNL. esophagus dilated Fuller Plan)   HEMORRHOID BANDING  09/23/2013   --Dr. Greer Pickerel   HERNIA REPAIR     inguinal   HYSTEROSCOPY W/ ENDOMETRIAL ABLATION     KNEE ARTHROSCOPY Right 04/06/2021   Procedure: RIGHT KNEE ARTHROSCOPY WITH DEBRIDEMENT;  Surgeon: [name], MD;  Location: [place];  Service: Orthopedics;  Laterality: Right;   NASAL SINUS SURGERY     x5   PARTIAL KNEE  ARTHROPLASTY Right 06/01/2020   Procedure: Right knee patellofemoral replacement;  Surgeon: [name], MD;  Location: [place];  Service: Orthopedics;  Laterality: Right;   POSTERIOR CERVICAL FUSION/FORAMINOTOMY N/A 12/29/2021   Procedure: POSTERIOR CERVICAL FUSION CERVICAL THREE-SEVEN;  Surgeon: Newman Pies, MD;  Location: [place];  Service: Neurosurgery;  Laterality: N/A;   REMOVAL OF STONES  09/27/2018   Procedure: REMOVAL OF STONES;  Surgeon: [name], MD;  [facility];  Service: Endoscopy;;   SPHINCTEROTOMY  09/27/2018   Procedure: SPHINCTEROTOMY;  Surgeon: [name], MD;  [facility];  Service: Endoscopy;;   TONSILLECTOMY     removed as a child   TOTAL HIP ARTHROPLASTY Left 03/12/2019   Procedure: LEFT TOTAL HIP ARTHROPLASTY ANTERIOR APPROACH;  [name], [name], MD)   UPPER GASTROINTESTINAL ENDOSCOPY     Past Medical History:  Diagnosis Date   Abdominal pain last 4 months   and nausea also   Allergy    Anemia    history of   Anxiety    Bipolar disorder ([place])    atpical bipolar disorder   Cervical disc disease limited rom turning to left   hx. C6- C7 -hx. past fusion(bone graft used)   Cholecystitis    Chronic pain    DDD (degenerative disc disease), lumbar 09/2015   dextroscoliosis with multilevel DDD and facet arthrosis most notable for R foraminal disc protrusion L4/5 producing severe R neural foraminal stenosis abutting R L4 nerve root, moderate spinal canal and mild lat recesss and R neural foraminal stenosis L3/4 (MRI)   Depression    bipolar depression   Disorders of porphyrin metabolism    Felon of finger of left hand 11/10/2016   Fibromyalgia    GERD (gastroesophageal reflux disease)    Headache    occasionally   Internal hemorrhoids    Interstitial cystitis 06/06/2012   hx.   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 (Dr Baird Cancer retinologist)   Tuberculosis    BP (!) 153/95   Pulse (!) 104   Ht '5\' 2"'$  (1.575 m)   Wt 123 lb (55.8 kg)   LMP 07/25/1998   SpO2 97%   BMI 22.50 kg/m   Opioid Risk Score:   Fall Risk Score:  `1  Depression screen PHQ 2/9     03/14/2022    1:51 PM 01/26/2022   11:22 AM 09/21/2021   12:46 PM 08/17/2021   10:54 AM 10/13/2020   11:24 AM 07/10/2020   12:15 PM 07/02/2019   11:21 AM  Depression screen PHQ 2/9  Decreased Interest 0 0 0 0 0 0 0  Down, Depressed, Hopeless 0 0 0 0 0 0 0  PHQ - 2 Score 0 0 0 0 0 0 0  Altered sleeping  0 '1 3 1 3 '$ 0  Tired, decreased energy  '1 2 3 1 2 '$ 0  Change in appetite  0 0 0 0 0 0  Feeling bad or failure about yourself   0 0 0 0 0 0  Trouble concentrating  0 0 0 0 0 0  Moving slowly or fidgety/restless  0 0 0 0 0 0  Suicidal thoughts  0 0 0 0 0 0  PHQ-9 Score  '1 3 6 2 5 '$ 0  Difficult doing work/chores     Not difficult at all  Not difficult at all     Review of Systems  Constitutional: Negative.   HENT: Negative.    Eyes: Negative.  Respiratory: Negative.    Cardiovascular: Negative.   Gastrointestinal: Negative.   Endocrine: Negative.   Genitourinary: Negative.   Musculoskeletal: Negative.   Skin: Negative.   Allergic/Immunologic: Negative.   Neurological: Negative.   Hematological: Negative.   Psychiatric/Behavioral: Negative.        Objective:   Physical Exam  Awake, alert, appropriate, wearing cervical collar; using single pint cane to walk, accompanied by husband, NAD  MS:  RUE- deltoids 4+/5; biceps 5-/5; triceps 5-/5; WE 5-/5; grip 5-/5; and FA  4+/5 LUE- deltoids 5-/5; biceps/triceps 5-/5; WE 4-/5; grip 4+/5 and FA 4/5 RLE-5-/5 in HF/KE/DF and PF LLE- 5-/5 in same muscles Some crepitus in R knee (hx of partial R knee replacement)  Neuro: Sensation to light touch decreased at C5 on Left- intact C4 Decreased most at C6/7- slightly better at C8/T1 Intact to light touch in T2-T12 as well as L1-L5 dermatomes B/L  No hoffman's B/L in Ue's or clonus in LE's.      Assessment & Plan:   Pt is a 66 yr old female with hx of chronic constipation, chronic pain; gallstones, GERD and anemia; as well as C5/6 fx s/p anterior/posterior fusion C3/7 on 6/7 with C8 radiculopathy and revision 6/8 for thoracic pedicle screw placement- Here for f/u on Incomplete SCI without spasticity.  Now having hypersensitivity and hyperalgesia.   Increase Lyrica/pregabalin to 150 mg in Am and 300 mg at night- maybe 1-2 hours before bedtime.   2. Get Home exercise program- from OT - and then restart after can get collar off in 1 month.    3. Can continue PT for balance and endurance right now.   4. Cannot do duloxetine- due to previous  bipolar dx.    5. Lidocaine ointment 5%- up to 4x/day as needed on L hand/etc- thin layer- and rub it in OR thicker layer and apply saran wrap over it.   6. Went over the cold feeling is due to lack of vasoconstriction of blood vessels so lose eat only in LUE- as compared ot over entire body- neurologically based.   7.  Went over prognosis- thinking 12 -18 months from original fracture, will likely hit maximal strength exam- can take longer for sensory return.   8. F/U in 3 months- call if any issues; give it 3-4 weeks, then give me a call to let me know how it's going pain wise.    I spent a total of  36  minutes on total care today- >50% coordination of care- due to education on prognosis, discussion of options of pain meds, etc

## 2022-03-16 ENCOUNTER — Ambulatory Visit: Payer: Medicare Other | Admitting: Physical Therapy

## 2022-03-16 ENCOUNTER — Encounter: Payer: Self-pay | Admitting: Physical Therapy

## 2022-03-16 DIAGNOSIS — R2681 Unsteadiness on feet: Secondary | ICD-10-CM

## 2022-03-16 DIAGNOSIS — M6281 Muscle weakness (generalized): Secondary | ICD-10-CM | POA: Diagnosis not present

## 2022-03-16 DIAGNOSIS — R278 Other lack of coordination: Secondary | ICD-10-CM

## 2022-03-16 DIAGNOSIS — R2689 Other abnormalities of gait and mobility: Secondary | ICD-10-CM

## 2022-03-16 NOTE — Therapy (Signed)
OUTPATIENT PHYSICAL THERAPY NEURO TREATMENT   Patient Name: Rachel Duran MRN: [id number] DOB:[birth year], 66 y.o., female Today's Date: 03/16/2022   PCP: Ria Bush, MD REFERRING PROVIDER: Courtney Heys, MD    PT End of Session - 03/16/22 1325     Visit Number 9    Number of Visits 19   18 + eval   Date for PT Re-Evaluation 04/29/22   pushed out for scheduling   Authorization Type [employer]    Progress Note Due on Visit 10    PT Start Time 1315    PT Stop Time 1407    PT Time Calculation (min) 52 min    Equipment Utilized During Treatment Gait belt;Cervical collar    Activity Tolerance Patient tolerated treatment well    Behavior During Therapy WFL for tasks assessed/performed                Past Medical History:  Diagnosis Date   Abdominal pain last 4 months   and nausea also   Allergy    Anemia    history of   Anxiety    Bipolar disorder ([place])    atpical bipolar disorder   Cervical disc disease limited rom turning to left   hx. C6- C7 -hx. past fusion(bone graft used)   Cholecystitis    Chronic pain    DDD (degenerative disc disease), lumbar 09/2015   dextroscoliosis with multilevel DDD and facet arthrosis most notable for R foraminal disc protrusion L4/5 producing severe R neural foraminal stenosis abutting R L4 nerve root, moderate spinal canal and mild lat recesss and R neural foraminal stenosis L3/4 (MRI)   Depression    bipolar depression   Disorders of porphyrin metabolism    Felon of finger of left hand 11/10/2016   Fibromyalgia    GERD (gastroesophageal reflux disease)    Headache    occasionally   Internal hemorrhoids    Interstitial cystitis 06/06/2012   hx.   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 (Dr Baird Cancer retinologist)   Tuberculosis    Past Surgical History:  Procedure Laterality Date   ANTERIOR CERVICAL DECOMP/DISCECTOMY FUSION  2004   C5/6, C6/7   ANTERIOR CERVICAL DECOMP/DISCECTOMY FUSION  02/2016   C3/4, C4/5 with plating [name])   ANTERIOR CERVICAL DECOMP/DISCECTOMY FUSION  12/29/2021   Procedure: ANTERIOR CERVICAL DECOMPRESSION/ DISCECTOMY FUSION CERVICAL THREE - SEVEN;  Surgeon: Newman Pies, MD;  Location: [place];  Service: Neurosurgery;;   ANTERIOR CERVICAL DECOMPRESSION/DISCECTOMY FUSION 4 LEVELS N/A 12/29/2021   Procedure: CERVICAL FIVE-SIX CORPECTOMY,;  Surgeon: Newman Pies, MD;  Location: [place];  Service: Neurosurgery;  Laterality: N/A;   AUGMENTATION MAMMAPLASTY Bilateral 03/25/2010   BREAST ENHANCEMENT SURGERY  2010   BREAST IMPLANT EXCHANGE  10/2014   exchange saline implants, B mastopexy/capsulorraphy ([facility])   BUNIONECTOMY Bilateral yrs ago   [place]   x 1   CHOLECYSTECTOMY  06/11/2012   Procedure: LAPAROSCOPIC CHOLECYSTECTOMY WITH INTRAOPERATIVE CHOLANGIOGRAM;  Surgeon:  [name], MD,FACS;  Location: [facility];  Service: General;  Laterality: N/A;   COLONOSCOPY  02/2018   done for positive cologuard - WNL, rpt 10 yrs Fuller Plan)   CYSTOSCOPY     ERCP  05/22/2012   Procedure: ENDOSCOPIC RETROGRADE CHOLANGIOPANCREATOGRAPHY (ERCP);  Surgeon: [name], MD,FACG;  Location: [facility];  Service: Endoscopy;  Laterality: N/A;   ERCP N/A 09/17/2013   Procedure: ENDOSCOPIC RETROGRADE CHOLANGIOPANCREATOGRAPHY (ERCP);  Surgeon: [name], MD;  Location: [facility];  Service: Endoscopy;  Laterality: N/A;   ERCP N/A 09/27/2018   Procedure: ENDOSCOPIC RETROGRADE CHOLANGIOPANCREATOGRAPHY (ERCP);   Surgeon: [name], MD;  Location: [facility];  Service: Endoscopy;  Laterality: N/A;   ESOPHAGOGASTRODUODENOSCOPY  09/2016   WNL. esophagus dilated Fuller Plan)   HEMORRHOID BANDING  09/23/2013   --Dr. Greer Pickerel   HERNIA REPAIR     inguinal   HYSTEROSCOPY W/ ENDOMETRIAL ABLATION     KNEE ARTHROSCOPY Right 04/06/2021   Procedure: RIGHT KNEE ARTHROSCOPY WITH DEBRIDEMENT;  Surgeon: [name], MD;  Location: [place];  Service: Orthopedics;  Laterality: Right;   NASAL SINUS SURGERY     x5   PARTIAL KNEE ARTHROPLASTY Right 06/01/2020   Procedure: Right knee patellofemoral replacement;  Surgeon: [name], MD;  Location: [place];  Service: Orthopedics;  Laterality: Right;   POSTERIOR CERVICAL FUSION/FORAMINOTOMY N/A 12/29/2021   Procedure: POSTERIOR CERVICAL FUSION CERVICAL THREE-SEVEN;  Surgeon: Newman Pies, MD;  Location: [place];  Service: Neurosurgery;  Laterality: N/A;   REMOVAL OF STONES  09/27/2018   Procedure: REMOVAL OF STONES;  Surgeon: [name], MD;  Location: [facility];  Service: Endoscopy;;   SPHINCTEROTOMY  09/27/2018   Procedure: SPHINCTEROTOMY;  Surgeon: [name], MD;  Location[facility];  Service: Endoscopy;;   TONSILLECTOMY     removed as a child   TOTAL HIP ARTHROPLASTY Left 03/12/2019   Procedure: LEFT TOTAL HIP ARTHROPLASTY ANTERIOR APPROACH;  [name], [name], MD)   UPPER GASTROINTESTINAL ENDOSCOPY     Patient Active Problem List   Diagnosis Date Noted   Cervical spine arthritis with nerve pain 03/14/2022   Fusion of spine, cervical region    Anemia    Primary hypertension    Radiculopathy 01/07/2022   Cervical radiculopathy 12/29/2021   C6 cervical fracture ([place]) 12/29/2021   C5 cervical fracture ([place]) 12/21/2021   Advanced directives, counseling/discussion 09/23/2021   Strep throat 08/18/2021   Swelling of right index finger 07/14/2021   Synovitis of right knee     Scoliosis 01/13/2021   Osteopenia 07/02/2020   Status post left knee surgery 06/01/2020   Pulmonary nodule 05/27/2020   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   Chronic patellofemoral pain of right knee 05/05/2020   Positive ANA (antinuclear antibody) 06/05/2019   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   Unilateral primary osteoarthritis, left hip 01/28/2019   Pelvic pain 12/12/2018   Common bile duct (CBD) obstruction    Venous insufficiency of left lower extremity 07/04/2018   Dyslipidemia 09/22/2017   DDD (degenerative disc disease), cervical 04/03/2017   DDD (degenerative disc disease), lumbar 04/03/2017   Onychomycosis 03/21/2017   Dysphagia 10/18/2016   Encounter for chronic pain management 10/18/2016   Biliary stasis 09/26/2016   HNP (herniated nucleus pulposus), lumbar 09/23/2015   Iron deficiency 07/30/2015   Health maintenance examination 06/15/2015   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   Clavicle enlargement 12/13/2014   Prediabetes 12/13/2014   Plantar fasciitis,  bilateral 09/11/2014   Medicare annual wellness visit, subsequent 06/10/2014   Abnormal thyroid function test 06/10/2014   Chronic pain syndrome 06/10/2014   CFS (chronic fatigue syndrome) 04/08/2014   Postmenopausal atrophic vaginitis 10/19/2012   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   Cervical disc disorder with radiculopathy of cervical region 05/28/2010   APHTHOUS ULCERS 01/31/2008   Insomnia 11/09/2007   Constipation 10/11/2007   Allergic rhinitis 04/12/2007   MDD (major depressive disorder), recurrent episode, moderate ([place]) 03/05/2007   [name] syndrome 03/05/2007   GERD 03/05/2007   ROSACEA 03/05/2007   NEURALGIA 03/05/2007   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[place]) 12/06/2006   Chronic interstitial cystitis 12/06/2006   Fibromyalgia 12/06/2006    ONSET DATE: 01/12/2022 (referral)  REFERRING DIAG: M54.10 (ICD-10-CM) - Radiculopathy, site unspecified M43.22 (ICD-10-CM)  - Fusion of spine, cervical region   THERAPY DIAG:  Other lack of coordination  Muscle weakness (generalized)  Unsteadiness on feet  Other abnormalities of gait and mobility  Rationale for Evaluation and Treatment Rehabilitation   PERTINENT HISTORY: Chronic pain, [name] syndrome, GERD, cervical radiculopathy, fibromyalgia, hereditary macular degeneration (she has most trouble with peripheral vision), herniated lumbar disc L4/L5, DDD, Partial right knee arthroplasty, scoliosis  From rehab admit note:  "ACDF 2004 C5-6 and C6-7 and 2017 C3-4 and C4-5, left total hip arthroplasty 2020, scleroderma.  Presented 12/29/2021 with noted history of fall 10/29/2021 suffering a C5-6 fracture subluxation/cervical kyphosis with persistent neck pain and initial conservative care with failed medical management.  Underwent 3 stage anterior posterior fusion C5-6 corpectomy, posterior fusion C3-7, ACDF C3-7 12/29/2021.  Postop she developed left hand weakness consistent with a C8 radiculopathy as well as small amount of epidural fluid at C5-6.  Cervical CT demonstrated that the left T1 pedicle screw was a bit low and into the neural foramen.  Patient underwent revision of posterior cervical instrumentation 12/30/2021 per Dr. Newman Pies.  Placed in a cervical hard collar when out of bed."    PRECAUTIONS: Cervical and Fall-cervical hard collar when OOB -updated precautions scanned into other media 03/16/2022.   SUBJECTIVE: Pt denies falls or near misses.  She walked with husband and dogs yesterday morning though she did not manage the dogs, her husband did.  She is still in the collar until September 18 per returned MD note.                                                                                                                                                                                            PAIN:  Endorses same pain from prior session. Are you having pain? Yes: NPRS scale: 2/10 Pain location:  left arm and hand, mildly in the right knee Pain description: N/T w/ hypersensitivity in left thumb and forefinger.  Knee feels tight. Aggravating factors: touching cold/hot Relieving factors: walking (for knee)  TODAY'S TREATMENT:  Discussed precautions remaining from most recent visit with Dr. [name].  Discussed going without AD using best judgement for comfort level in new environments.  Time spent communicating with front office about scanning precautions for PT/OT into pt chart and clarifying if there are any visit limits with pt insurance per pt request.  Aspen collar height adjusted to support anterior aspect of pt's head and jaw which video demo to pt for needed adjustment at home.  Pt has mild pressure discomfort in posterior aspect of collar prior to adjustment, recommended trialing a thin washcloth layer in back of collar to address this ensuring collar is properly aligned and tightened to stabilize neck.  Assessed LTGs: -Discussed walking 5 days a week as pt reports this is her standard routine now. -5xSTS 14.09 sec -Assessed BERG:  [facility] PT Assessment - 03/16/22 1343       Berg Balance Test   Sit to Stand Able to stand without using hands and stabilize independently    Standing Unsupported Able to stand safely 2 minutes    Sitting with Back Unsupported but Feet Supported on Floor or Stool Able to sit safely and securely 2 minutes    Stand to Sit Sits safely with minimal use of hands    Transfers Able to transfer safely, minor use of hands    Standing Unsupported with Eyes Closed Able to stand 10 seconds safely    Standing Unsupported with Feet Together Able to place feet together independently and stand 1 minute safely    From Standing, Reach Forward with Outstretched Arm Can reach confidently >25 cm (10")    From Standing Position, Pick up Object from Floor Able to pick up shoe safely and easily   keeps head neutral due to collar   From Standing Position, Turn to Look Behind  Over each Shoulder Turn sideways only but maintains balance   limited due to collar, but good trunk rotation and weight shift bilaterally   Turn 360 Degrees Able to turn 360 degrees safely in 4 seconds or less    Standing Unsupported, Alternately Place Feet on Step/Stool Able to stand independently and safely and complete 8 steps in 20 seconds    Standing Unsupported, One Foot in Front Able to place foot tandem independently and hold 30 seconds    Standing on One Leg Able to lift leg independently and hold > 10 seconds   Stands on RLE 11 sec, LLE 30 sec   Total Score 54    Berg comment: No fall risk            Time spent scheduling at end of session in preparation for re-cert and combining PT/OT visit days.  PATIENT EDUCATION: Education details: Continue walking w/  supervision from spouse and HEP.   Person educated: Patient and Spouse Education method: Explanation Education comprehension: verbalized understanding  HOME EXERCISE PROGRAM: Access Code: [email or web address]  GOALS: Goals reviewed with patient? Yes  SHORT TERM GOALS: Target date: 02/18/2022  Pt will be independent with strength and balance HEP with supervision from family as needed. Baseline:  Established, pt compliant, would benefit from progressions. Goal status: MET  2.  Pt will decrease 5xSTS to </=17 seconds w/o UE support in order to demonstrate decreased risk for falls and improved functional bilateral LE strength and  power. Baseline: 20.81sec w/ hands on knees; 02/22/2022 13.37sec w/o UE support Goal status: MET  3.  Pt will demonstrate TUG of </=12 seconds in order to decrease risk of falls and improve functional mobility using LRAD. Baseline: 14.20 sec w/ RW; 02/22/2022 w/o SPC 8.19sec Goal status: MET  4.  Pt will increase BERG balance score to 40/56 to demonstrate improved static balance. Baseline: 36/56; 02/22/2022 50/56 Goal status: MET  5.  Pt will ambulate >/= 1000' feet w/ LRAD on 6MWT to demonstrate improved  functional endurance for home and community participation. Baseline: 900' w/ RW; 756' w/ SPC Goal status: IN PROGRESS  6.  Pt will report no pain at rest and during static standing activities to promote improved activity tolerance. Baseline: 3/10; 02/22/2022 R knee pain 3/10  Goal status:  NOT MET  LONG TERM GOALS: Target date: 03/18/2022 (8 weeks); 04/15/2022 (12 weeks)  Pt will report return to neighborhood walking 3 days per week to promote return to prior level of function. Baseline:  She is walking 5 days a week with husband in neighborhood during cooler hours of day. Goal status: MET  2.  Pt will decrease 5xSTS to </=13 seconds w/o UE support in order to demonstrate decreased risk for falls and improved functional bilateral LE strength and power. Baseline: 20.81sec w/ hands on knees; 02/22/2022 13.37sec w/o UE; 03/16/2022 14.09 sec w/o UE support Goal status: IN PROGRESS  3.  Pt will increase BERG balance score to >/=55/56 to demonstrate improved static balance. Baseline: 36/56; 02/22/2022 50/56; 03/16/2022 54/56 Goal status: NOT MET-pt would have met this goal if collar was able to be removed.  4.  Pt will ambulate >/= 1000 feet w/ LRAD on 6MWT to demonstrate improved functional endurance for home and community participation. Baseline:  900' w/ RW Goal status: REVISED  5.  Pt will ambulate >/=1000 feet on various indoor and outdoor surfaces with LRAD and modified independent level of assist to promote household and community access. Baseline: No distance formally calculated on eval. Goal status: INITIAL  6.  Pt will negotiate 14 stairs w/o UE support and reciprocal steps up and down independently to promote improved safety and independence within home environment. Baseline:  Pt demonstrates hesitancy with descending stairs. Goal status: INITIAL  ASSESSMENT:  CLINICAL IMPRESSION: Completed assessment of 3 of 6 LTGs with pt making excellent progress towards overall targets.  She is  walking on a regular schedule of 5 days per week.  Her 5xSTS has not changed much from prior assessment, completed in 14.09 sec w/o UE support.  She likely would have maxed out her BERG score if she was able to go without her cervical collar, but pt is limited in neck ROM at current.  She is due to have the cervical collar off in about a month on 04/11/2022.  Will address remaining goals in order to fully assess progress at next visit, but patient is progressing better than expected at present.   OBJECTIVE IMPAIRMENTS Abnormal gait, decreased activity tolerance, decreased balance, decreased endurance, decreased mobility, difficulty walking, decreased strength, impaired UE functional use, and pain.   ACTIVITY LIMITATIONS carrying, lifting, bending, squatting, and stairs  PARTICIPATION LIMITATIONS: driving, community activity, and yard work  PERSONAL FACTORS Age, Fitness, Past/current experiences, Time since onset of injury/illness/exacerbation, and 1-2 comorbidities: chronic pain and DDD  are also affecting patient's functional outcome.   REHAB POTENTIAL: Good  CLINICAL DECISION MAKING: Evolving/moderate complexity  EVALUATION COMPLEXITY: Moderate  PLAN: PT FREQUENCY:  1x/wk for 12  weeks (Discussed pt progress with pt and limitations in husband's schedule due to upcoming school year and dropping to 1x/wk for remainder of visits-was previously 2x/wk for 6 wks followed by 1x/wk for 6 wks)  PT DURATION: 12 weeks (cert 14 weeks - 48/11/4625)  PLANNED INTERVENTIONS: Therapeutic exercises, Therapeutic activity, Neuromuscular re-education, Balance training, Gait training, Patient/Family education, Joint mobilization, Stair training, Vestibular training, DME instructions, Cryotherapy, Moist heat, Manual therapy, and Re-evaluation  PLAN FOR NEXT SESSION:  Add shoulder exercises to HEP for OT until pt is able to return to OT due to scheduling delay.  Finish LTGs -update to include FGA if no collar.  Pt  will be appropriate for FGA once collar is removed and restrictions lifted.  Update HEP prn - BLE strengthening (R glut/quad) and dynamic balance - bckwds/tandem walking.  Foam beam, bean bag toss on airex, reaching outside BOS.  Gait training w/ no AD.  Stair management.  SLS-step taps up to 4th stair w/ airex.  Tilt board.  Treadmill training w/ incline.  Outside balance on grass/hill-pt has personal goals of going to grasshoppers game and walking dogs independently.    Elease Etienne, PT, DPT [facility] [address] [place], [place] [address] [phone] 03/16/22, 5:08 PM

## 2022-03-21 ENCOUNTER — Ambulatory Visit
Admission: RE | Admit: 2022-03-21 | Discharge: 2022-03-21 | Disposition: A | Discharge status: Home / Self Care | Payer: Medicare Other | Source: Ambulatory Visit | Attending: Obstetrics and Gynecology | Admitting: Obstetrics and Gynecology

## 2022-03-21 DIAGNOSIS — Z1231 Encounter for screening mammogram for malignant neoplasm of breast: Secondary | ICD-10-CM

## 2022-03-21 NOTE — Progress Notes (Unsigned)
Office Visit Note  Patient: Rachel Duran             Date of Birth: 11-Jan-1956           MRN: [id number]             PCP: Ria Bush, MD Referring: Ria Bush, MD Visit Date: 04/04/2022 Occupation: @GUAROCC @  Subjective:  Recent C-spine surgery   History of Present Illness: Rachel Duran is a 66 y.o. female with history of limited systemic sclerosis, osteoarthritis, and DDD.  Patient was last seen in the office on 12/28/2021.  She had a fall after syncopal episode in April 2023 resulting in a C-spine fracture. Patient underwent C-spine surgery on 12/29/2021 by Dr. [name].  Patient reports that during the postoperative period she experienced numbness and diminished sensation in bilateral upper extremities.  She underwent a revision of posterior cervical instrumentation on 12/30/2021.  She has been going to PT and OT as advised.  She continues to wear her neck collar as advised.  She has no current appointment with Dr. [name] on 05/02/2022.  She is under the care of Dr. Alveta Heimlich who recently increased her dose of Lyrica and gabapentin to help with the neurologist she is experiencing in the left hand.  She is also been using lidocaine ointment has been helpful. She states that overall her symptoms of [name] have been better this summer.  She denies any digital ulcerations or signs of gangrene.  She denies any increase skin tightness or thickening.  She denies any new skin changes.  She remains on amlodipine 7.5 mg daily.    Patient reports that she has upcoming appointment with Dr. Vaughan Browner in October and Dr. Haroldine Laws in December 2023.  She denies any new or worsening pulmonary symptoms.     Activities of Daily Living:  Patient reports morning stiffness for several hours.   Patient Reports nocturnal pain.  Difficulty dressing/grooming: Reports Difficulty climbing stairs: Reports Difficulty getting out of chair: Reports Difficulty using hands for taps, buttons,  cutlery, and/or writing: Reports  Review of Systems  Constitutional:  Positive for fatigue.  HENT:  Positive for mouth dryness. Negative for mouth sores.   Eyes:  Negative for dryness.  Respiratory:  Negative for shortness of breath.   Cardiovascular:  Negative for chest pain and palpitations.  Gastrointestinal:  Negative for blood in stool, constipation and diarrhea.  Endocrine: Negative for increased urination.  Genitourinary:  Negative for involuntary urination.  Musculoskeletal:  Positive for joint pain, joint pain, myalgias, muscle weakness, morning stiffness, muscle tenderness and myalgias. Negative for gait problem and joint swelling.  Skin:  Positive for rash. Negative for color change, hair loss and sensitivity to sunlight.  Allergic/Immunologic: Negative for susceptible to infections.  Neurological:  Negative for dizziness and headaches.  Hematological:  Negative for swollen glands.  Psychiatric/Behavioral:  Negative for depressed mood and sleep disturbance. The patient is not nervous/anxious.     PMFS History:  Patient Active Problem List   Diagnosis Date Noted  . Fusion of spine, cervical region   . Anemia   . Primary hypertension   . Cervical spine arthritis with nerve pain 12/29/2021  . C6 cervical fracture ([place]) 12/29/2021  . C5 cervical fracture ([place]) 12/21/2021  . Advanced directives, counseling/discussion 09/23/2021  . Swelling of right index finger 07/14/2021  . Synovitis of right knee   . Scoliosis 01/13/2021  . Osteopenia 07/02/2020  . Status post left knee surgery 06/01/2020  . Pulmonary nodule 05/27/2020  .  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[place]) 05/05/2020  . Chronic patellofemoral pain of right knee 05/05/2020  . Positive ANA (antinuclear antibody) 06/05/2019  . 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  . Unilateral primary osteoarthritis, left hip 01/28/2019  . Pelvic pain 12/12/2018  . Common bile duct (CBD) obstruction   . Venous  insufficiency of left lower extremity 07/04/2018  . Dyslipidemia 09/22/2017  . DDD (degenerative disc disease), cervical 04/03/2017  . DDD (degenerative disc disease), lumbar 04/03/2017  . Onychomycosis 03/21/2017  . Dysphagia 10/18/2016  . Encounter for chronic pain management 10/18/2016  . Biliary stasis 09/26/2016  . HNP (herniated nucleus pulposus), lumbar 09/23/2015  . Iron deficiency 07/30/2015  . Health maintenance examination 06/15/2015  . 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 Clavicle enlargement 12/13/2014  . Prediabetes 12/13/2014  . Plantar fasciitis, bilateral 09/11/2014  . Medicare annual wellness visit, subsequent 06/10/2014  . Abnormal thyroid function test 06/10/2014  . Chronic pain syndrome 06/10/2014  . CFS (chronic fatigue syndrome) 04/08/2014  . Postmenopausal atrophic vaginitis 10/19/2012  . 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 Cervical disc disorder with radiculopathy of cervical region 05/28/2010  . APHTHOUS ULCERS 01/31/2008  . Insomnia 11/09/2007  . Constipation 10/11/2007  . Allergic rhinitis 04/12/2007  . MDD (major depressive disorder), recurrent episode, moderate ([place]) 03/05/2007  . [name] syndrome 03/05/2007  . GERD 03/05/2007  . ROSACEA 03/05/2007  . NEURALGIA 03/05/2007  . 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 Chronic interstitial cystitis 12/06/2006  . Fibromyalgia 12/06/2006    Past Medical History:  Diagnosis Date  . Abdominal pain last 4 months   and nausea also  . Allergy   . Anemia    history of  . Anxiety   . Bipolar disorder ([place])    atpical bipolar disorder  . 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 C6- C7 -hx. past fusion(bone graft used)  . Cholecystitis   . Chronic pain   . DDD (degenerative disc disease), lumbar 09/2015   dextroscoliosis with multilevel DDD and facet arthrosis most notable for R foraminal disc protrusion L4/5 producing severe R neural foraminal stenosis abutting R  L4 nerve root, moderate spinal canal and mild lat recesss and R neural foraminal stenosis L3/4 (MRI)  . Depression    bipolar depression  . Disorders of porphyrin metabolism   . Felon of finger of left hand 11/10/2016  . Fibromyalgia   . GERD (gastroesophageal reflux disease)   . Headache    occasionally  . Internal hemorrhoids   . Interstitial cystitis 06/06/2012   hx.  . Irritable bowel syndrome   . PONV (postoperative nausea and vomiting)    now uses stomach blockers and no ponv  . 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 Primary hypertension   . Raynauds disease    hx.  . Scleroderma ([place]) 04/24/2020  . Seronegative arthritis    [name]  . 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 Tuberculosis     Family History  Problem Relation Age of Onset  . CAD Father 5       MI, nonsmoker  . Esophageal cancer Father 25  . Stomach cancer Father   . Scleroderma Mother   . Hypertension Mother   . Esophageal cancer Paternal Grandfather   .  Stomach cancer Paternal Grandfather   . Diabetes Maternal Grandmother   . Arthritis Brother   . Stroke Neg Hx   . Colon cancer Neg Hx   . Rectal cancer Neg Hx    Past Surgical History:  Procedure Laterality Date  . ANTERIOR CERVICAL DECOMP/DISCECTOMY FUSION  2004   C5/6, C6/7  . ANTERIOR CERVICAL DECOMP/DISCECTOMY FUSION  02/2016   C3/4, C4/5 with plating [name])  . ANTERIOR CERVICAL DECOMP/DISCECTOMY FUSION  12/29/2021   Procedure: ANTERIOR CERVICAL DECOMPRESSION/ DISCECTOMY FUSION CERVICAL THREE - SEVEN;  Surgeon: Newman Pies, MD;  Location: [facility];  Service: Neurosurgery;;  . ANTERIOR CERVICAL DECOMPRESSION/DISCECTOMY FUSION 4 LEVELS N/A 12/29/2021   Procedure: CERVICAL FIVE-SIX CORPECTOMY,;  Surgeon: Newman Pies, MD;  Location: [facility];   Service: Neurosurgery;  Laterality: N/A;  . AUGMENTATION MAMMAPLASTY Bilateral 03/25/2010  . BREAST ENHANCEMENT SURGERY  2010  . BREAST IMPLANT EXCHANGE  10/2014   exchange saline implants, B mastopexy/capsulorraphy ([facility])  . BUNIONECTOMY Bilateral yrs ago  . [place]   x 1  . CHOLECYSTECTOMY  06/11/2012   Procedure: LAPAROSCOPIC CHOLECYSTECTOMY WITH INTRAOPERATIVE CHOLANGIOGRAM;  Surgeon: [name], MD,FACS;  Location: [facility];  Service: General;  Laterality: N/A;  . COLONOSCOPY  02/2018   done for positive cologuard - WNL, rpt 10 yrs [facility])  . CYSTOSCOPY    . ERCP  05/22/2012   Procedure: ENDOSCOPIC RETROGRADE CHOLANGIOPANCREATOGRAPHY (ERCP);  Surgeon: [name], MD,FACG;  Location: [facility];  Service: Endoscopy;  Laterality: N/A;  . ERCP N/A 09/17/2013   Procedure: ENDOSCOPIC RETROGRADE CHOLANGIOPANCREATOGRAPHY (ERCP);  Surgeon: [name], MD;  Location: [facility];  Service: Endoscopy;  Laterality: N/A;  . ERCP N/A 09/27/2018   Procedure: ENDOSCOPIC RETROGRADE CHOLANGIOPANCREATOGRAPHY (ERCP);  Surgeon: [name], MD;  Location: [facility];  Service: Endoscopy;  Laterality: N/A;  . ESOPHAGOGASTRODUODENOSCOPY  09/2016   WNL. esophagus dilated [facility])  . HEMORRHOID BANDING  09/23/2013   --Dr. Greer Pickerel  . HERNIA REPAIR     inguinal  . HYSTEROSCOPY W/ ENDOMETRIAL ABLATION    . KNEE ARTHROSCOPY Right 04/06/2021   Procedure: RIGHT KNEE ARTHROSCOPY WITH DEBRIDEMENT;  Surgeon: [name], MD;  Location: [facility];  Service: Orthopedics;  Laterality: Right;  . NASAL SINUS SURGERY     x5  . PARTIAL KNEE ARTHROPLASTY Right 06/01/2020   Procedure: Right knee patellofemoral replacement;  Surgeon: [name], MD;  Location: [facility];  Service: Orthopedics;  Laterality: Right;  . POSTERIOR CERVICAL FUSION/FORAMINOTOMY N/A 12/29/2021   Procedure: POSTERIOR CERVICAL FUSION CERVICAL  THREE-SEVEN;  Surgeon: Newman Pies, MD;  Location: [facility];  Service: Neurosurgery;  Laterality: N/A;  . REMOVAL OF STONES  09/27/2018   Procedure: REMOVAL OF STONES;  Surgeon: [name], MD;  [facility];  Service: Endoscopy;;  . [name]  09/27/2018   Procedure: SPHINCTEROTOMY;  Surgeon: [name], MD;  [facility];  Service: Endoscopy;;  . TONSILLECTOMY     removed as a child  . TOTAL HIP ARTHROPLASTY Left 03/12/2019   Procedure: LEFT TOTAL HIP ARTHROPLASTY ANTERIOR APPROACH;  [name], [name], MD)  . 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 Influenza Split 05/04/2011, 04/04/2012  . Influenza Whole 05/25/2007, 04/28/2008, 04/30/2009, 03/26/2010  . Influenza,inj,Quad PF,6+ Mos 04/16/2015, 04/15/2016, 05/16/2017, 04/10/2018, 04/19/2019, 04/07/2020  . Influenza-Unspecified 03/25/2014  . MMR 11/28/2017  . Moderna Sars-Covid-2 Vaccination 09/02/2019, 09/30/2019, 04/07/2020  . Pneumococcal Conjugate-13 08/02/2013  . Pneumococcal Polysaccharide-23 07/30/2009  . Td 11/01/2005, 11/10/2016  . Zoster Recombinat (Shingrix) 07/21/2017, 11/03/2017     Objective: Vital Signs: BP (!) 144/87 (BP Location: Left Arm, Patient Position: Sitting, Cuff Size: Normal)   Pulse 96   Resp 14   Ht $R'5\' 3"'WW$  (1.6 m)   Wt 125 lb 12.8 oz (57.1 kg)   LMP 07/25/1998   BMI 22.28 kg/m    Physical Exam Vitals and nursing note reviewed.  Constitutional:      Appearance: She is well-developed.  HENT:     Head: Normocephalic and atraumatic.  Eyes:      Conjunctiva/sclera: Conjunctivae normal.  Cardiovascular:     Rate and Rhythm: Normal rate and regular rhythm.     Heart sounds: Normal heart sounds.  Pulmonary:     Effort: Pulmonary effort is normal.     Breath sounds: Normal breath sounds.  Abdominal:     General: Bowel sounds are normal.     Palpations: Abdomen is soft.  Musculoskeletal:     Cervical back: Normal range of motion.  Skin:    General: Skin is warm and dry.     Capillary Refill: Capillary refill takes less than 2 seconds.     Comments: No digital ulcerations or signs of gangrene noted.  Neurological:     Mental Status: She is alert and oriented to person, place, and time.  Psychiatric:        Behavior: Behavior normal.     Musculoskeletal Exam: C-spine in cervical collar.  Shoulder joints, elbow joints, wrist joints, MCPs, PIPs, and DIPs good ROM with no synovitis.  PIP and DIP thickening consistent with osteoarthritis of both hands. Hip joints difficult to assess in seated position.  Right knee replacement has warmth but no effusion.  Left knee has good ROM with no warmth or effusion.  Ankle joints have good ROM with no tenderness or joint swelling.   CDAI Exam: CDAI Score: -- Patient Global: --; Provider Global: -- Swollen: --; Tender: -- Joint Exam 04/04/2022   No joint exam has been documented for this visit   There is currently no information documented on the homunculus. Go to the Rheumatology activity and complete the homunculus joint exam.  Investigation: No additional findings.  Imaging: No results found.  Recent Labs: Lab Results  Component Value Date   WBC 6.7 01/13/2022   HGB 8.6 (L) 01/13/2022   PLT 336 01/13/2022   NA 134 (L) 01/13/2022   K 4.3 01/13/2022   CL 100 01/13/2022   CO2 27 01/13/2022   GLUCOSE 97 01/13/2022   BUN <5 (L) 01/13/2022   CREATININE 0.46 01/13/2022   BILITOT 0.5 01/10/2022   ALKPHOS 72 01/10/2022   AST 14 (L) 01/10/2022   ALT 9 01/10/2022   PROT 5.6 (L)  01/10/2022   ALBUMIN 3.2 (L) 01/10/2022   CALCIUM 8.7 (L) 01/13/2022   GFRAA >60 03/13/2019    Speciality Comments: No specialty comments available.  Procedures:  No procedures performed Allergies: Celebrex [celecoxib], Inh [isoniazid], Cymbalta [duloxetine hcl], Nucynta [tapentadol], Silenor [doxepin hcl], Benzoin, Duragesic-100 [fentanyl], Keflex [cephalexin],  Codeine phosphate, Demerol [meperidine hcl], Lithobid [lithium], and Sulfamethoxazole     Assessment / Plan:     Visit Diagnoses: 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[place]) - AVISE on 03/26/20: indeterminate index 0.0, ANA positive, no titer, weak positive anti-histone, rest of ENA panel negative, family history of scleroderma.  10/06/21: AVISE lupus index -1.1, ANA positive, titer negative, dsDNA equivocal, (RNP, Ro, SSB, Smith, centromere, SCL 70, RNA polymerase 3, Jo 1, CB Negative).  She has had less frequent and less severe symptoms of [name] phenomenon this summer.  She has no digital ulcerations or signs of gangrene on examination today.  Good capillary refill was noted.  No increase skin tightness or thickening was noted.  She has been taking amlodipine 7.5 mg daily which has been managing her symptoms. No dysphagia, constipation, or increased reflux. BP was 144/87 today in the office. Discussed the importance of close blood pressure monitoring.  She has not noticed any new or worsening pulmonary symptoms. 05/01/21: CT chest with contrast no evidence of ILD, PFTs normal, aortic atherosclerosis and coronary artery calcification was noted.  Chest x-ray on 10/29/2021 negative for acute cardiopulmonary disease. 06/23/21 echocardiogram showed no evidence of pulmonary hypertension September 17, 2021 coronary calcium score 18.  Upcoming appointment with Dr. Vaughan Browner on 05/16/2022 and Dr. Haroldine Laws on 06/29/2022. CBC and BMP updated on 01/13/22. AVISE lab work will be updated at her follow up visit.   She was advised to notify us if she develops any  new or worsening symptoms.  She will follow up in 5 months or sooner if needed.  [name] disease without gangrene -She remains on amlodipine 7.41m by mouth daily.  Not currently active.  No digital ulcerations or signs of gangrene.  Her symptoms have been less severe and less frequent during the summer months.  She was advised to notify u[place]if she develops any new or worsening symptoms.  Primary osteoarthritis of both hands: PIP and DIP thickening consistent with osteoarthritis of both hands.  Most severe and DIP joints.  Complete fist formation noted bilaterally.  Discussed the importance of joint protection and muscle strengthening.  S/P hip replacement, left: Doing well.  Difficult to assess range of motion in seated position today.  Primary osteoarthritis of left knee: Range of motion of the left knee joint with no warmth or effusion.  Status post right partial knee replacement: Chronic pain.  Warmth but no effusion.  Using a cane to assist with ambulation.  DDD (degenerative disc disease), cervical - history of disc disease and had fusion in the past.  She had fall after a syncopal episode in April 2023 resulting in cervical spine fracture.  C-spine surgery scheduled on 12/29/2021 and revision on 12/30/2021 performed by Dr. JArnoldo Morale  She has been going to physical therapy and Occupational Therapy as advised.  She continues to wear the C-spine collar.  She has been under the care of Dr. LDagoberto Ligasfor pain management.  Her dose of Lyrica and gabapentin were recently increased.  She continues to have significant neuralgias in the left hand.  She has been trying to gradually improve her strength as tolerated.  She has an upcoming appointment with Dr. JArnoldo Moraleon 05/02/2022.  DDD (degenerative disc disease), lumbar: Chronic pain.   Other form of scoliosis of thoracolumbar spine: Chronic pain   Osteopenia of neck of right femur - October 06, 2021 the BMD measured at [facility] is 0.704 g/cm2 with a T-score  of-2.4, BMD 0.704.  Her DXA followed by her GYN.  Fibromyalgia: She continues to experience intermittent myalgias and muscle tenderness due to fibromyalgia.  She is currently taking Lyrica and gabapentin as prescribed.  She is under the excellent care of Dr. Dagoberto Ligas.   Chronic pain syndrome: Under the care of Dr. Dagoberto Ligas.   Other fatigue: Stable.   Other medical conditions are listed as follows:   Prediabetes  Hx of porphyria  History of gastroesophageal reflux (GERD)  History of bipolar disorder  CFS (chronic fatigue syndrome)  Family history of scleroderma  Orders: No orders of the defined types were placed in this encounter.  No orders of the defined types were placed in this encounter.   Follow-Up Instructions: Return in about 5 months (around 09/04/2022) for Limited systemic sclerosis, Osteoarthritis, DDD.   Ofilia Neas, PA-C  Note - This record has been created using Dragon software.  Chart creation errors have been sought, but may not always  have been located. Such creation errors do not reflect on  the standard of medical care.

## 2022-03-22 ENCOUNTER — Ambulatory Visit: Payer: Medicare Other | Admitting: Physical Therapy

## 2022-03-22 ENCOUNTER — Encounter: Payer: Self-pay | Admitting: Physical Therapy

## 2022-03-22 DIAGNOSIS — M6281 Muscle weakness (generalized): Secondary | ICD-10-CM

## 2022-03-22 DIAGNOSIS — R2681 Unsteadiness on feet: Secondary | ICD-10-CM

## 2022-03-22 DIAGNOSIS — R2689 Other abnormalities of gait and mobility: Secondary | ICD-10-CM

## 2022-03-22 DIAGNOSIS — R278 Other lack of coordination: Secondary | ICD-10-CM

## 2022-03-22 NOTE — Therapy (Unsigned)
OUTPATIENT PHYSICAL THERAPY NEURO TREATMENT   Patient Name: Rachel Duran MRN: [id number] DOB:1956-01-30, 66 y.o., female Today's Date: 03/23/2022   PCP: Eustaquio Boyden, MD REFERRING PROVIDER: Genice Rouge, MD   PT progress note for Rachel Duran.  Reporting period 01/20/2022 to 03/22/2022  See Note below for Objective Data and Assessment of Progress/Goals  Thank you for the referral of this patient. Camille Bal, PT, DPT    PT End of Session - 03/22/22 1321     Visit Number 10    Number of Visits 19   18 + eval   Date for PT Re-Evaluation 04/29/22   pushed out for scheduling   Authorization Type [employer]IC[employer]    Progress Note Due on Visit 10    PT Start Time 1318    PT Stop Time 1403    PT Time Calculation (min) 45 min    Equipment Utilized During Treatment Gait belt;Cervical collar    Activity Tolerance Patient tolerated treatment well    Behavior During Therapy WFL for tasks assessed/performed                Past Medical History:  Diagnosis Date   Abdominal pain last 4 months   and nausea also   Allergy    Anemia    history of   Anxiety    Bipolar disorder (HCC)    atpical bipolar disorder   Cervical disc disease limited rom turning to left   hx. C6- C7 -hx. past fusion(bone graft used)   Cholecystitis    Chronic pain    DDD (degenerative disc disease), lumbar 09/2015   dextroscoliosis with multilevel DDD and facet arthrosis most notable for R foraminal disc protrusion L4/5 producing severe R neural foraminal stenosis abutting R L4 nerve root, moderate spinal canal and mild lat recesss and R neural foraminal stenosis L3/4 (MRI)   Depression    bipolar depression   Disorders of porphyrin metabolism    [place] of finger of left hand 11/10/2016   Fibromyalgia    GERD (gastroesophageal reflux disease)    Headache    occasionally   Internal hemorrhoids    Interstitial cystitis 06/06/2012   hx.   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)   Tuberculosis    Past Surgical History:  Procedure Laterality Date   ANTERIOR CERVICAL DECOMP/DISCECTOMY FUSION  2004   C5/6, C6/7   ANTERIOR CERVICAL DECOMP/DISCECTOMY FUSION  02/2016   C3/4, C4/5 with plating Lovell Sheehan)   ANTERIOR CERVICAL DECOMP/DISCECTOMY FUSION  12/29/2021   Procedure: ANTERIOR CERVICAL DECOMPRESSION/ DISCECTOMY FUSION CERVICAL THREE - SEVEN;  Surgeon: Tressie Stalker, MD;  Location[facility] OR;  Service: Neurosurgery;;   ANTERIOR CERVICAL DECOMPRESSION/DISCECTOMY FUSION 4 LEVELS N/A 12/29/2021   Procedure: CERVICAL FIVE-SIX CORPECTOMY,;  Surgeon: Tressie Stalker, MD;  Location: [facility] OR;  Service: Neurosurgery;  Laterality: N/A;   AUGMENTATION MAMMAPLASTY Bilateral 03/25/2010   BREAST ENHANCEMENT SURGERY  2010   BREAST IMPLANT EXCHANGE  10/2014  exchange saline implants, B mastopexy/capsulorraphy ([facility])   BUNIONECTOMY Bilateral yrs ago   [place]   x 1   CHOLECYSTECTOMY  06/11/2012   Procedure: LAPAROSCOPIC CHOLECYSTECTOMY WITH INTRAOPERATIVE CHOLANGIOGRAM;  Surgeon: [name], MD,FACS;  Location: [facility] ORS;  Service: General;  Laterality: N/A;   COLONOSCOPY  02/2018   done for positive cologuard - WNL, rpt 10 yrs [facility] Plan)   CYSTOSCOPY     ERCP  05/22/2012   Procedure: ENDOSCOPIC RETROGRADE CHOLANGIOPANCREATOGRAPHY (ERCP);  Surgeon: [name], MD,FACG;  Location: [facility];  Service: Endoscopy;  Laterality: N/A;   ERCP N/A 09/17/2013   Procedure: ENDOSCOPIC  RETROGRADE CHOLANGIOPANCREATOGRAPHY (ERCP);  Surgeon: [name], MD;  Location: [facility];  Service: Endoscopy;  Laterality: N/A;   ERCP N/A 09/27/2018   Procedure: ENDOSCOPIC RETROGRADE CHOLANGIOPANCREATOGRAPHY (ERCP);  Surgeon: [name], MD;  Location: [facility];  Service: Endoscopy;  Laterality: N/A;   ESOPHAGOGASTRODUODENOSCOPY  09/2016   WNL. esophagus dilated [facility])   HEMORRHOID BANDING  09/23/2013   --Dr. Greer Pickerel   HERNIA REPAIR     inguinal   HYSTEROSCOPY W/ ENDOMETRIAL ABLATION     KNEE ARTHROSCOPY Right 04/06/2021   Procedure: RIGHT KNEE ARTHROSCOPY WITH DEBRIDEMENT;  Surgeon: [name], MD;  Location: [place];  Service: Orthopedics;  Laterality: Right;   NASAL SINUS SURGERY     x5   PARTIAL KNEE ARTHROPLASTY Right 06/01/2020   Procedure: Right knee patellofemoral replacement;  Surgeon: [name], MD;  Location: [place];  Service: Orthopedics;  Laterality: Right;   POSTERIOR CERVICAL FUSION/FORAMINOTOMY N/A 12/29/2021   Procedure: POSTERIOR CERVICAL FUSION CERVICAL THREE-SEVEN;  Surgeon: Newman Pies, MD;  Location: [place];  Service: Neurosurgery;  Laterality: N/A;   REMOVAL OF STONES  09/27/2018   Procedure: REMOVAL OF STONES;  Surgeon: [name], MD;  Location: [facility];  Service: Endoscopy;;   SPHINCTEROTOMY  09/27/2018   Procedure: SPHINCTEROTOMY;  Surgeon: [name], MD;  Location: [facility];  Service: Endoscopy;;   TONSILLECTOMY     removed as a child   TOTAL HIP ARTHROPLASTY Left 03/12/2019   Procedure: LEFT TOTAL HIP ARTHROPLASTY ANTERIOR APPROACH;  [name], [name], MD)   UPPER GASTROINTESTINAL ENDOSCOPY     Patient Active Problem List   Diagnosis Date Noted   Fusion of spine, cervical region    Anemia    Primary hypertension    Cervical spine arthritis with nerve pain 12/29/2021   C6 cervical fracture ([place]) 12/29/2021   C5 cervical fracture ([place])  12/21/2021   Advanced directives, counseling/discussion 09/23/2021   Swelling of right index finger 07/14/2021   Synovitis of right knee    Scoliosis 01/13/2021   Osteopenia 07/02/2020   Status post left knee surgery 06/01/2020   Pulmonary nodule 05/27/2020   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   Chronic patellofemoral pain of right knee 05/05/2020   Positive ANA (antinuclear antibody) 06/05/2019   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   Unilateral primary osteoarthritis, left hip 01/28/2019   Pelvic pain 12/12/2018   Common bile duct (CBD) obstruction    Venous insufficiency of left lower extremity 07/04/2018   Dyslipidemia 09/22/2017   DDD (degenerative disc disease), cervical 04/03/2017   DDD (degenerative disc disease), lumbar 04/03/2017   Onychomycosis 03/21/2017   Dysphagia 10/18/2016   Encounter for chronic pain management 10/18/2016   Biliary stasis 09/26/2016   HNP (herniated nucleus pulposus), lumbar 09/23/2015   Iron deficiency 07/30/2015  Health maintenance examination 06/15/2015   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   Clavicle enlargement 12/13/2014   Prediabetes 12/13/2014   Plantar fasciitis, bilateral 09/11/2014   Medicare annual wellness visit, subsequent 06/10/2014   Abnormal thyroid function test 06/10/2014   Chronic pain syndrome 06/10/2014   CFS (chronic fatigue syndrome) 04/08/2014   Postmenopausal atrophic vaginitis 10/19/2012   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22 (ICD-10-CM) - Fusion of spine, cervical region   THERAPY DIAG:  Other lack of coordination  Muscle weakness (generalized)  Unsteadiness on feet  Other abnormalities of gait and mobility  Rationale for Evaluation and Treatment Rehabilitation   PERTINENT HISTORY: Chronic pain, [name] syndrome, GERD, cervical radiculopathy, fibromyalgia, hereditary macular degeneration (she has most trouble with peripheral vision), herniated lumbar disc L4/L5, DDD, Partial right knee arthroplasty, scoliosis  From rehab admit note:  "ACDF 2004 C5-6 and C6-7 and 2017 C3-4 and C4-5, left total hip arthroplasty 2020, scleroderma.  Presented 12/29/2021 with noted history of fall 10/29/2021 suffering a C5-6 fracture subluxation/cervical kyphosis with persistent neck pain and initial conservative care with failed medical management.  Underwent 3 stage anterior posterior fusion C5-6 corpectomy, posterior fusion C3-7, ACDF C3-7 12/29/2021.  Postop she developed left hand weakness consistent with a C8 radiculopathy as well as small amount of epidural fluid at C5-6.  Cervical CT demonstrated that the left T1 pedicle screw was a bit low and into the neural foramen.  Patient underwent revision of posterior cervical instrumentation 12/30/2021 per Dr. Newman Pies.  Placed in a cervical hard collar when out of bed."    PRECAUTIONS: Cervical and Fall-cervical hard collar when OOB -updated precautions scanned into other media 03/16/2022.   SUBJECTIVE: Pt denies falls or near misses.  Her walking has been limited to husband's availability around work schedule and weather.  Her left hand nerve pain has become worse over last 3 weeks but she is hopeful the nerves are trying to wake up.  PAIN:  Endorses same pain from prior session. Are you having pain? Yes: NPRS scale: 3/10 Pain location: left index and middle fingers mostly, mildly in the right knee Pain description: N/T w/ hypersensitivity in left thumb and forefinger.  Knee feels tight. Aggravating factors: touching cold/hot Relieving factors: walking (for knee)  TODAY'S TREATMENT:   STAIRS:  Level of Assistance: SBA and CGA  Stair Negotiation Technique: Alternating Pattern  Forwards with Single Rail on Left  Number of Stairs: 16   Height of Stairs: 6"  Comments: R rail descending.  Attempted ascent with no rails with pt requiring close supervision/SBA, pt is safest with rails due to inability to visualize feet/stairs due to cervical collar.    GAIT: Gait pattern: step through pattern, decreased arm swing- Right, decreased arm swing- Left, and decreased trunk rotation Distance walked: 575' (indoors level) + 500' (outdoors on uneven pavement) Assistive device utilized: None Level of assistance: SBA Comments: Pt demonstrates adequate motor planning with limited cervical ROM due to collar.  She continues to have guarded posture in her upper trunk.  Completed distance during 6MWT, able to maintain light conversation with therapist.  Reviewed precautions with pt per pt request. Added shoulder exercises to HEP: -shoulder flexion to 90 deg w/ 1# bilateral wt x12 -shoulder abd w/ thumb up to 90 deg w/ 1# bilateral wt x12 Cued to prevent shoulder hike and discussed alternate positioning (standing vs seated vs supine) for help preventing compensation.  HEP has standing variation but discussed performing seated.  Discussed using 1# wts pt bought at home.   PATIENT EDUCATION: Education details: Continue walking w/  supervision from spouse and HEP.  Discussed having pt reach out to surgeon's office about collar to wear in the shower as she is holding  head neutral without collar currently.  PT does not see orders for this particular aspect of care.  Discussed caution with shower until able to contact surgeon. Person educated: Patient and Spouse Education method: Explanation Education comprehension: verbalized understanding  HOME EXERCISE PROGRAM: Access Code: [email or web address]  GOALS: Goals reviewed with patient? Yes  SHORT TERM GOALS: Target date: 02/18/2022  Pt will be independent with strength and balance HEP with supervision from family as needed. Baseline:  Established, pt compliant, would benefit from progressions. Goal status: MET  2.  Pt will decrease 5xSTS to </=17 seconds w/o UE support in order to demonstrate decreased risk for falls and improved functional bilateral LE strength and power. Baseline: 20.81sec w/ hands on knees; 02/22/2022 13.37sec w/o UE support Goal status: MET  3.  Pt will demonstrate TUG of </=12 seconds in order to decrease risk of falls and improve functional mobility using LRAD. Baseline: 14.20 sec w/ RW; 02/22/2022 w/o SPC 8.19sec Goal status: MET  4.  Pt will increase BERG balance score to 40/56 to demonstrate improved static balance. Baseline: 36/56; 02/22/2022 50/56 Goal status: MET  5.  Pt will ambulate >/= 1000' feet w/ LRAD on 6MWT to demonstrate improved functional endurance for home and community participation. Baseline: 900' w/ RW; 756' w/ SPC Goal status: IN PROGRESS  6.  Pt will report no pain at rest and during static standing activities to promote improved activity tolerance. Baseline: 3/10; 02/22/2022 R knee pain 3/10  Goal status:  NOT MET  LONG TERM GOALS: Target date: 03/18/2022 (8 weeks); 04/15/2022 (12 weeks)  Pt will report return to neighborhood walking 3 days per week to promote return to prior level of function. Baseline:  She is walking 5 days a  week with husband in neighborhood during cooler hours of day. Goal status: MET  2.  Pt will decrease 5xSTS to </=13 seconds w/o UE support  in order to demonstrate decreased risk for falls and improved functional bilateral LE strength and power. Baseline: 20.81sec w/ hands on knees; 02/22/2022 13.37sec w/o UE; 03/16/2022 14.09 sec w/o UE support Goal status: IN PROGRESS  3.  Pt will increase BERG balance score to >/=55/56 to demonstrate improved static balance. Baseline: 36/56; 02/22/2022 50/56; 03/16/2022 54/56 Goal status: NOT MET-pt would have met this goal if collar was able to be removed.  4.  Pt will ambulate >/= 1000 feet w/ LRAD on 6MWT to demonstrate improved functional endurance for home and community participation. Baseline:  900' w/ RW; 1075' no AD Goal status: MET  5.  Pt will ambulate >/=1000 feet on various indoor and outdoor surfaces with LRAD and modified independent level of assist to promote household and community access. Baseline: No distance formally;  calculated on eval.  03/22/2022 1075' no AD; 03/22/2022 16 stairs reciprocally using R rail on descent Goal status: MET  6.  Pt will negotiate 14 stairs w/o UE support and reciprocal steps up and down independently to promote improved safety and independence within home environment. Baseline:  Pt demonstrates hesitancy with descending stairs. Goal status: IN PROGRESS  ASSESSMENT:  CLINICAL IMPRESSION:  Pt ambulates 1075' w/o AD on 6MWT over various indoor and outdoor surfaces with good stability and stamina noted.  She is able to most safely navigate large flights of stairs w/ the railing while maintaining reciprocal pattern of steps.  She requires further work with stair descent as she has increased hesitancy due to difficulty visualizing foot placement.  Added light shoulder exercises to HEP this visit to continue progressing postural stability in preparation for collar removal in coming weeks.  She continues to benefit from skilled PT to further progress towards remaining goals.  OBJECTIVE IMPAIRMENTS Abnormal gait, decreased activity tolerance, decreased balance,  decreased endurance, decreased mobility, difficulty walking, decreased strength, impaired UE functional use, and pain.   ACTIVITY LIMITATIONS carrying, lifting, bending, squatting, and stairs  PARTICIPATION LIMITATIONS: driving, community activity, and yard work  PERSONAL FACTORS Age, Fitness, Past/current experiences, Time since onset of injury/illness/exacerbation, and 1-2 comorbidities: chronic pain and DDD  are also affecting patient's functional outcome.   REHAB POTENTIAL: Good  CLINICAL DECISION MAKING: Evolving/moderate complexity  EVALUATION COMPLEXITY: Moderate  PLAN: PT FREQUENCY:  1x/wk for 12 weeks (Discussed pt progress with pt and limitations in husband's schedule due to upcoming school year and dropping to 1x/wk for remainder of visits-was previously 2x/wk for 6 wks followed by 1x/wk for 6 wks)  PT DURATION: 12 weeks (cert 14 weeks - 78/12/7542)  PLANNED INTERVENTIONS: Therapeutic exercises, Therapeutic activity, Neuromuscular re-education, Balance training, Gait training, Patient/Family education, Joint mobilization, Stair training, Vestibular training, DME instructions, Cryotherapy, Moist heat, Manual therapy, and Re-evaluation  PLAN FOR NEXT SESSION:  Pt will be appropriate for FGA once collar is removed and restrictions lifted.  Periscapular strength/lat pull/SA punch to HEP.  Update HEP prn - BLE strengthening (R glut/quad) and dynamic balance - bckwds/tandem walking.  Foam beam, bean bag toss on airex, reaching outside BOS.  Gait training w/ no AD.  Stair management.  SLS-step taps up to 4th stair w/ airex.  Tilt board.  Treadmill training w/ incline.  Outside balance on grass/hill-pt has personal goals of going to grasshoppers game and walking dogs independently.  Scap squeezes, lat pulldowns-no resistance vs yellow band?,  shoulder ext., SA punches w/ 1# wts  Elease Etienne, PT, DPT Outpatient Neuro [facility] [address], [place], [place]  [address] [phone] 03/23/22, 5:57 PM

## 2022-03-23 ENCOUNTER — Ambulatory Visit (INDEPENDENT_AMBULATORY_CARE_PROVIDER_SITE_OTHER): Payer: Medicare Other | Admitting: Family Medicine

## 2022-03-23 ENCOUNTER — Encounter: Payer: Self-pay | Admitting: Family Medicine

## 2022-03-23 VITALS — BP 136/82 | HR 105 | Temp 98.1°F | Ht 62.0 in | Wt 125.1 lb

## 2022-03-23 DIAGNOSIS — M5010 Cervical disc disorder with radiculopathy, unspecified cervical region: Secondary | ICD-10-CM

## 2022-03-23 DIAGNOSIS — M792 Neuralgia and neuritis, unspecified: Secondary | ICD-10-CM

## 2022-03-23 DIAGNOSIS — G8929 Other chronic pain: Secondary | ICD-10-CM | POA: Diagnosis not present

## 2022-03-23 DIAGNOSIS — G9332 Myalgic encephalomyelitis/chronic fatigue syndrome: Secondary | ICD-10-CM

## 2022-03-23 DIAGNOSIS — G894 Chronic pain syndrome: Secondary | ICD-10-CM | POA: Diagnosis not present

## 2022-03-23 DIAGNOSIS — N3010 Interstitial cystitis (chronic) without hematuria: Secondary | ICD-10-CM | POA: Diagnosis not present

## 2022-03-23 DIAGNOSIS — M47812 Spondylosis without myelopathy or radiculopathy, cervical region: Secondary | ICD-10-CM

## 2022-03-23 MED ORDER — MORPHINE SULFATE 15 MG PO TABS: 15.0000 mg | ORAL_TABLET | ORAL | 0 refills | Status: DC | PRN

## 2022-03-23 MED ORDER — AMPHETAMINE-DEXTROAMPHET ER 30 MG PO CP24
ORAL_CAPSULE | ORAL | 0 refills | Status: DC
Start: 2022-03-23 — End: 2022-06-27

## 2022-03-23 NOTE — Patient Instructions (Addendum)
Good to see you today.  Continue current medicines. Adderall and morphine refilled today  Return in 3 months for follow up visit

## 2022-03-23 NOTE — Assessment & Plan Note (Addendum)
Adderall XR refilled.

## 2022-03-23 NOTE — Assessment & Plan Note (Signed)
Recently saw urology Amalia Hailey) for yearly checkup.

## 2022-03-23 NOTE — Assessment & Plan Note (Signed)
S/p recent complex cervical neck surgery by Dr [name], seems to be recovering well. She is also now seeing PM&R at [facility] (Dr Dagoberto Ligas).

## 2022-03-23 NOTE — Assessment & Plan Note (Signed)
[place] CSRS reviewed.  Continue morphine IR '15mg'$  TID PRN, refilled today.

## 2022-03-23 NOTE — Assessment & Plan Note (Signed)
Appreciate neurosurgery and PM&R care. 

## 2022-03-23 NOTE — Progress Notes (Signed)
Patient ID: Rachel Duran, female    DOB: Mar 04, 1956, 66 y.o.   MRN: [id number]  This visit was conducted in person.  BP 136/82   Pulse (!) 105   Temp 98.1 F (36.7 C) (Temporal)   Ht '5\' 2"'$  (1.575 m)   Wt 125 lb 2 oz (56.8 kg)   LMP 07/25/1998   SpO2 96%   BMI 22.89 kg/m    CC: chronic pain f/u visit  Subjective:   HPI: Rachel Duran is a 66 y.o. female presenting on 03/23/2022 for Pain Management (Here for 3 mo chronic pain f/u.)   R handed.   Longstanding chronic pain managed with opiate - MSIR '15mg'$  TID PRN #90/month (takes 2-3 tabs/day). Tolerates medication well without oversedation, gait unsteadiness. She also takes lyrica '150mg'$  TID (new frequency) and tizanidine '2mg'$  daily as needed.   Saw Dr Einar Pheasant last week for R great toe cellulitis ?gout - treated with doxycycline with significant improvement.   Recent complex neck surgery by Dr [name] for C5/6 fracture subluxation in prior cervical fusion.   Established with [facility] for persistent cervical radiculopathy after cervical fusion, saw Dr Dagoberto Ligas last week. Note reviewed. Lyrica frequency increased to TID. Undergoing PT/OT due to ongoing left arm symptoms (numbness/weakness, shock-like nerve pain to thumb). Also using topical lidocaine for this.      Relevant past medical, surgical, family and social history reviewed and updated as indicated. Interim medical history since our last visit reviewed. Allergies and medications reviewed and updated. Outpatient Medications Prior to Visit  Medication Sig Dispense Refill   ALPRAZolam (XANAX) 1 MG tablet Take 0.5 tablets (0.5 mg total) by mouth at bedtime. 30 tablet 0   amLODipine (NORVASC) 2.5 MG tablet Take 3 tablets (7.5 mg total) by mouth daily. 45 tablet 0   amLODipine (NORVASC) 5 MG tablet Take 7.5 mg by mouth daily.     ARIPiprazole (ABILIFY) 5 MG tablet Take 5 mg by mouth at bedtime.     aspirin 81 MG chewable tablet Chew 1 tablet (81 mg total) by mouth  daily. 30 tablet 0   Calcium Carbonate-Vitamin D (CALTRATE 600+D PO) Take 2 tablets by mouth daily.     clindamycin (CLEOCIN) 150 MG capsule Take '600mg'$  1 hour prior to dental procedure 10 capsule 0   cyanocobalamin (,VITAMIN B-12,) 1000 MCG/ML injection INJECT 1ML INTO THE MUSCLE EVERY 21 DAYSAS DIRECTED (Patient taking differently: Inject 1,000 mcg into the muscle See admin instructions. Every 21 days) 3 mL 3   cyclobenzaprine (FLEXERIL) 10 MG tablet Take 1 tablet (10 mg total) by mouth 3 (three) times daily as needed for muscle spasms. 60 tablet 0   Eszopiclone 3 MG TABS TAKE 1 TABLET BY MOUTH AT BEDTIME IMMEDIATLEY AS DIRECTED. 90 tablet 0   ferrous sulfate 325 (65 FE) MG tablet Take 1 tablet (325 mg total) by mouth every Monday, Wednesday, and Friday. 30 tablet 3   gabapentin (NEURONTIN) 100 MG capsule Take 100 mg by mouth 3 (three) times daily.     lamoTRIgine (LAMICTAL) 200 MG tablet Take 1 tablet (200 mg total) by mouth daily. 30 tablet 0   lidocaine (LIDODERM) 5 % Place 3 patches onto the skin daily. Remove & Discard patch within 12 hours or as directed by MD 60 patch 0   lidocaine (XYLOCAINE) 5 % ointment Apply 1 Application topically as needed. Apply up to 4x/day- on LUE for nerve pain 50 g 5   linaclotide (LINZESS) 290 MCG CAPS capsule Take  1 capsule (290 mcg total) by mouth daily. 30 capsule 6   naloxegol oxalate (MOVANTIK) 12.5 MG TABS tablet Take 1 tablet (12.5 mg total) by mouth daily. 30 tablet 0   ondansetron (ZOFRAN) 4 MG tablet Take 4 mg by mouth every 8 (eight) hours as needed for nausea or vomiting.     ondansetron (ZOFRAN-ODT) 4 MG disintegrating tablet Take 4 mg by mouth every 6 (six) hours as needed for nausea or vomiting.     pantoprazole (PROTONIX) 40 MG tablet TAKE ONE TABLET TWICE A DAY WITH MEALS 180 tablet 0   pentosan polysulfate (ELMIRON) 100 MG capsule Take 200 mg by mouth 2 (two) times daily.      polyethylene glycol (MIRALAX / GLYCOLAX) 17 g packet Take 17 g by  mouth 2 (two) times daily.     pregabalin (LYRICA) 150 MG capsule Take 1 capsule (150 mg total) by mouth in the morning, at noon, and at bedtime. 270 capsule 1   ursodiol (ACTIGALL) 300 MG capsule TAKE 1 CAPSULE BY MOUTH 2 TIMES DAILY. 180 capsule 0   amphetamine-dextroamphetamine (ADDERALL XR) 30 MG 24 hr capsule TAKE 1 CAPSUEL BY MOUTH EACH MORNING (Patient taking differently: Take 30 mg by mouth daily.) 90 capsule 0   morphine (MSIR) 15 MG tablet Take 1 tablet (15 mg total) by mouth every 4 (four) hours as needed for moderate pain. 30 tablet 0   No facility-administered medications prior to visit.     Per HPI unless specifically indicated in ROS section below Review of Systems  Objective:  BP 136/82   Pulse (!) 105   Temp 98.1 F (36.7 C) (Temporal)   Ht '5\' 2"'$  (1.575 m)   Wt 125 lb 2 oz (56.8 kg)   LMP 07/25/1998   SpO2 96%   BMI 22.89 kg/m   Wt Readings from Last 3 Encounters:  03/23/22 125 lb 2 oz (56.8 kg)  03/14/22 123 lb (55.8 kg)  03/14/22 123 lb (55.8 kg)      Physical Exam Vitals and nursing note reviewed.  Constitutional:      Appearance: Normal appearance. She is not ill-appearing.     Comments:  Cervical collar in place Ambulates with cane  Cardiovascular:     Rate and Rhythm: Normal rate and regular rhythm.     Pulses: Normal pulses.     Heart sounds: Normal heart sounds. No murmur heard. Pulmonary:     Effort: Pulmonary effort is normal. No respiratory distress.     Breath sounds: Normal breath sounds. No wheezing, rhonchi or rales.  Musculoskeletal:     Right lower leg: No edema.     Left lower leg: No edema.  Skin:    General: Skin is warm and dry.  Neurological:     Mental Status: She is alert.  Psychiatric:        Mood and Affect: Mood normal.        Behavior: Behavior normal.         Assessment & Plan:   Problem List Items Addressed This Visit     Encounter for chronic pain management - Primary (Chronic)    [place] CSRS reviewed.   Continue morphine IR '15mg'$  TID PRN, refilled today.       Chronic interstitial cystitis    Recently saw urology Amalia Hailey) for yearly checkup.       Cervical disc disorder with radiculopathy of cervical region    S/p recent complex cervical neck surgery by Dr [name], seems to be  recovering well. She is also now seeing PM&[facility] at [facility] (Dr Dagoberto Ligas).       Relevant Medications   amphetamine-dextroamphetamine (ADDERALL XR) 30 MG 24 hr capsule   CFS (chronic fatigue syndrome)    Adderall XR refilled.      Chronic pain syndrome   Relevant Medications   morphine (MSIR) 15 MG tablet   Cervical spine arthritis with nerve pain    Appreciate neurosurgery and PM&R care.       Relevant Medications   morphine (MSIR) 15 MG tablet     Meds ordered this encounter  Medications   amphetamine-dextroamphetamine (ADDERALL XR) 30 MG 24 hr capsule    Sig: TAKE 1 CAPSUEL BY MOUTH EACH MORNING    Dispense:  90 capsule    Refill:  0   morphine (MSIR) 15 MG tablet    Sig: Take 1 tablet (15 mg total) by mouth every 4 (four) hours as needed for moderate pain.    Dispense:  90 tablet    Refill:  0   No orders of the defined types were placed in this encounter.   Patient Instructions  Good to see you today.  Continue current medicines. Adderall and morphine refilled today  Return in 3 months for follow up visit   Follow up plan: Return in about 3 months (around 06/23/2022), or if symptoms worsen or fail to improve, for follow up visit.  Ria Bush, MD

## 2022-03-29 ENCOUNTER — Other Ambulatory Visit (HOSPITAL_COMMUNITY): Payer: Self-pay | Admitting: *Deleted

## 2022-03-29 DIAGNOSIS — M349 Systemic sclerosis, unspecified: Secondary | ICD-10-CM

## 2022-03-30 ENCOUNTER — Encounter: Payer: Self-pay | Admitting: Physical Therapy

## 2022-03-30 ENCOUNTER — Ambulatory Visit: Payer: Medicare Other | Attending: Physical Medicine and Rehabilitation | Admitting: Physical Therapy

## 2022-03-30 DIAGNOSIS — M79602 Pain in left arm: Secondary | ICD-10-CM | POA: Diagnosis present

## 2022-03-30 DIAGNOSIS — R208 Other disturbances of skin sensation: Secondary | ICD-10-CM | POA: Diagnosis present

## 2022-03-30 DIAGNOSIS — M25661 Stiffness of right knee, not elsewhere classified: Secondary | ICD-10-CM | POA: Diagnosis present

## 2022-03-30 DIAGNOSIS — M6281 Muscle weakness (generalized): Secondary | ICD-10-CM | POA: Diagnosis present

## 2022-03-30 DIAGNOSIS — R2689 Other abnormalities of gait and mobility: Secondary | ICD-10-CM | POA: Diagnosis present

## 2022-03-30 DIAGNOSIS — R2681 Unsteadiness on feet: Secondary | ICD-10-CM | POA: Diagnosis present

## 2022-03-30 DIAGNOSIS — R278 Other lack of coordination: Secondary | ICD-10-CM | POA: Diagnosis present

## 2022-03-30 NOTE — Therapy (Signed)
OUTPATIENT PHYSICAL THERAPY NEURO TREATMENT   Patient Name: Rachel Duran MRN: [id number] DOB:[birth year], 66 y.o., female Today's Date: 03/30/2022   PCP: Ria Bush, MD REFERRING PROVIDER: Courtney Heys, MD   PT progress note for [name].  Reporting period 01/20/2022 to 03/22/2022  See Note below for Objective Data and Assessment of Progress/Goals  Thank you for the referral of this patient. Elease Etienne, PT, DPT    PT End of Session - 03/30/22 1322     Visit Number 11    Number of Visits 19   18 + eval   Date for PT Re-Evaluation 04/29/22   pushed out for scheduling   Authorization Type [employer]ICARE    Progress Note Due on Visit 10    PT Start Time 1318    PT Stop Time 1405    PT Time Calculation (min) 47 min    Equipment Utilized During Treatment Gait belt;Cervical collar    Activity Tolerance Patient tolerated treatment well    Behavior During Therapy WFL for tasks assessed/performed                Past Medical History:  Diagnosis Date   Abdominal pain last 4 months   and nausea also   Allergy    Anemia    history of   Anxiety    Bipolar disorder ([place])    atpical bipolar disorder   Cervical disc disease limited rom turning to left   hx. C6- C7 -hx. past fusion(bone graft used)   Cholecystitis    Chronic pain    DDD (degenerative disc disease), lumbar 09/2015   dextroscoliosis with multilevel DDD and facet arthrosis most notable for R foraminal disc protrusion L4/5 producing severe R neural foraminal stenosis abutting R L4 nerve root, moderate spinal canal and mild lat recesss and R neural foraminal stenosis L3/4 (MRI)   Depression    bipolar depression   Disorders of porphyrin metabolism    Felon of finger of left hand 11/10/2016   Fibromyalgia    GERD (gastroesophageal reflux disease)    Headache    occasionally   Internal hemorrhoids    Interstitial cystitis 06/06/2012   hx.   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 (Dr Baird Cancer retinologist)   Tuberculosis    Past Surgical History:  Procedure Laterality Date   ANTERIOR CERVICAL DECOMP/DISCECTOMY FUSION  2004   C5/6, C6/7   ANTERIOR CERVICAL DECOMP/DISCECTOMY FUSION  02/2016   C3/4, C4/5 with plating [name])   ANTERIOR CERVICAL DECOMP/DISCECTOMY FUSION  12/29/2021   Procedure: ANTERIOR CERVICAL DECOMPRESSION/ DISCECTOMY FUSION CERVICAL THREE - SEVEN;  Surgeon: Newman Pies, MD;  Location: [place];  Service: Neurosurgery;;   ANTERIOR CERVICAL DECOMPRESSION/DISCECTOMY FUSION 4 LEVELS N/A 12/29/2021   Procedure: CERVICAL FIVE-SIX CORPECTOMY,;  Surgeon: Newman Pies, MD;  Location: [place];  Service: Neurosurgery;  Laterality: N/A;   AUGMENTATION MAMMAPLASTY Bilateral 03/25/2010   BREAST ENHANCEMENT SURGERY  2010   BREAST IMPLANT EXCHANGE  10/2014  exchange saline implants, B mastopexy/capsulorraphy ([facility])   BUNIONECTOMY Bilateral yrs ago   [place]   x 1   CHOLECYSTECTOMY  06/11/2012   Procedure: LAPAROSCOPIC CHOLECYSTECTOMY WITH INTRAOPERATIVE CHOLANGIOGRAM;  Surgeon: [name], MD,FACS;  Location: [facility];  Service: General;  Laterality: N/A;   COLONOSCOPY  02/2018   done for positive cologuard - WNL, rpt 10 yrs Fuller Plan)   CYSTOSCOPY     ERCP  05/22/2012   Procedure: ENDOSCOPIC RETROGRADE CHOLANGIOPANCREATOGRAPHY (ERCP);  Surgeon: [name], MD,FACG;  Location: [facility];  Service: Endoscopy;  Laterality: N/A;   ERCP N/A 09/17/2013   Procedure: ENDOSCOPIC RETROGRADE  CHOLANGIOPANCREATOGRAPHY (ERCP);  Surgeon: [name], MD;  Location: [facility];  Service: Endoscopy;  Laterality: N/A;   ERCP N/A 09/27/2018   Procedure: ENDOSCOPIC RETROGRADE CHOLANGIOPANCREATOGRAPHY (ERCP);  Surgeon: [name], MD;  Location: [facility];  Service: Endoscopy;  Laterality: N/A;   ESOPHAGOGASTRODUODENOSCOPY  09/2016   WNL. esophagus dilated [facility] Plan)   HEMORRHOID BANDING  09/23/2013   --Dr. Greer Pickerel   HERNIA REPAIR     inguinal   HYSTEROSCOPY W/ ENDOMETRIAL ABLATION     KNEE ARTHROSCOPY Right 04/06/2021   Procedure: RIGHT KNEE ARTHROSCOPY WITH DEBRIDEMENT;  Surgeon: [name], MD;  Location: [place];  Service: Orthopedics;  Laterality: Right;   NASAL SINUS SURGERY     x5   PARTIAL KNEE ARTHROPLASTY Right 06/01/2020   Procedure: Right knee patellofemoral replacement;  Surgeon: [name], MD;  Location: [place];  Service: Orthopedics;  Laterality: Right;   POSTERIOR CERVICAL FUSION/FORAMINOTOMY N/A 12/29/2021   Procedure: POSTERIOR CERVICAL FUSION CERVICAL THREE-SEVEN;  Surgeon: Newman Pies, MD;  Location: [place];  Service: Neurosurgery;  Laterality: N/A;   REMOVAL OF STONES  09/27/2018   Procedure: REMOVAL OF STONES;  Surgeon: [name], MD;  Location: [facility];  Service: Endoscopy;;   SPHINCTEROTOMY  09/27/2018   Procedure: SPHINCTEROTOMY;  Surgeon: [name], MD;  Location[facility];  Service: Endoscopy;;   TONSILLECTOMY     removed as a child   TOTAL HIP ARTHROPLASTY Left 03/12/2019   Procedure: LEFT TOTAL HIP ARTHROPLASTY ANTERIOR APPROACH;  [name], [name], MD)   UPPER GASTROINTESTINAL ENDOSCOPY     Patient Active Problem List   Diagnosis Date Noted   Fusion of spine, cervical region    Anemia    Primary hypertension    Cervical spine arthritis with nerve pain 12/29/2021   C6 cervical fracture ([place]) 12/29/2021   C5 cervical fracture ([place]) 12/21/2021    Advanced directives, counseling/discussion 09/23/2021   Swelling of right index finger 07/14/2021   Synovitis of right knee    Scoliosis 01/13/2021   Osteopenia 07/02/2020   Status post left knee surgery 06/01/2020   Pulmonary nodule 05/27/2020   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   Chronic patellofemoral pain of right knee 05/05/2020   Positive ANA (antinuclear antibody) 06/05/2019   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   Unilateral primary osteoarthritis, left hip 01/28/2019   Pelvic pain 12/12/2018   Common bile duct (CBD) obstruction    Venous insufficiency of left lower extremity 07/04/2018   Dyslipidemia 09/22/2017   DDD (degenerative disc disease), cervical 04/03/2017   DDD (degenerative disc disease), lumbar 04/03/2017   Onychomycosis 03/21/2017   Dysphagia 10/18/2016   Encounter for chronic pain management 10/18/2016   Biliary stasis 09/26/2016   HNP (herniated nucleus pulposus), lumbar 09/23/2015   Iron deficiency 07/30/2015  Health maintenance examination 06/15/2015   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   Clavicle enlargement 12/13/2014   Prediabetes 12/13/2014   Plantar fasciitis, bilateral 09/11/2014   Medicare annual wellness visit, subsequent 06/10/2014   Abnormal thyroid function test 06/10/2014   Chronic pain syndrome 06/10/2014   CFS (chronic fatigue syndrome) 04/08/2014   Postmenopausal atrophic vaginitis 10/19/2012   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   Cervical disc disorder with radiculopathy of cervical region 05/28/2010   APHTHOUS ULCERS 01/31/2008   Insomnia 11/09/2007   Constipation 10/11/2007   Allergic rhinitis 04/12/2007   MDD (major depressive disorder), recurrent episode, moderate ([place]) 03/05/2007   [name] syndrome 03/05/2007   GERD 03/05/2007   ROSACEA 03/05/2007   NEURALGIA 03/05/2007   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[place]) 12/06/2006   Chronic interstitial cystitis 12/06/2006   Fibromyalgia 12/06/2006     ONSET DATE: 01/12/2022 (referral)  REFERRING DIAG: M54.10 (ICD-10-CM) - Radiculopathy, site unspecified M43.22 (ICD-10-CM) - Fusion of spine, cervical region   THERAPY DIAG:  Other lack of coordination  Muscle weakness (generalized)  Unsteadiness on feet  Other abnormalities of gait and mobility  Rationale for Evaluation and Treatment Rehabilitation   PERTINENT HISTORY: Chronic pain, [name] syndrome, GERD, cervical radiculopathy, fibromyalgia, hereditary macular degeneration (she has most trouble with peripheral vision), herniated lumbar disc L4/L5, DDD, Partial right knee arthroplasty, scoliosis  From rehab admit note:  "ACDF 2004 C5-6 and C6-7 and 2017 C3-4 and C4-5, left total hip arthroplasty 2020, scleroderma.  Presented 12/29/2021 with noted history of fall 10/29/2021 suffering a C5-6 fracture subluxation/cervical kyphosis with persistent neck pain and initial conservative care with failed medical management.  Underwent 3 stage anterior posterior fusion C5-6 corpectomy, posterior fusion C3-7, ACDF C3-7 12/29/2021.  Postop she developed left hand weakness consistent with a C8 radiculopathy as well as small amount of epidural fluid at C5-6.  Cervical CT demonstrated that the left T1 pedicle screw was a bit low and into the neural foramen.  Patient underwent revision of posterior cervical instrumentation 12/30/2021 per Dr. Newman Pies.  Placed in a cervical hard collar when out of bed."    PRECAUTIONS: Cervical and Fall-cervical hard collar when OOB -updated precautions scanned into other media 03/16/2022.   SUBJECTIVE: Pt denies falls or near misses.  She is walking 45 mins at a time 4-5 days a week.  She feels like her endurance is coming back.  She feels more intense sensitivity in the thumb and forefinger on the left hand.  PAIN:  Endorses same pain from prior session. Are you having pain? Yes: NPRS scale: 2/10 Pain location: left index and thumb (medial index is worst) Pain description: N/T w/ hypersensitivity in left thumb and forefinger.  Aggravating factors: touching cold/hot Relieving factors: walking (for knee)  TODAY'S TREATMENT:  -SA punch in sitting 2x12; therapist prevents upper trap compensation by holding at shoulders/clavicle region -Lat pulldown w/ red theraband x15 -shoulder ext w/ red theraband x15 each UE; min cues to maintain elbow ext -Heel raises w/ heel dropped off foam x15 > toe raises w/ toe dropped off foam x15 using BUE support > holding heel level w/ toe on foam no UE support x3 to pt tolerance > holding toe level w/ heel on foam no UE support x3 to pt tolerance  PATIENT EDUCATION: Education details: Continue regular walking (pt has self-progressed tolerance well).  Continue HEP.  Review current shoulder HEP w/ OT in coming session to assess form and need for progression/regression.  Demonstrated and explained updates to HEP to husband in lobby. Person educated: Patient and Spouse Education method: Explanation Education comprehension: verbalized understanding  HOME EXERCISE PROGRAM: Access Code: [email or web address]  GOALS: Goals reviewed with patient? Yes  SHORT TERM GOALS: Target date: 02/18/2022  Pt will be independent with strength and balance HEP with supervision from family as needed. Baseline:  Established, pt compliant, would benefit from progressions. Goal status: MET  2.  Pt will decrease 5xSTS to </=17 seconds w/o UE support in order to demonstrate decreased risk for falls and improved functional bilateral LE strength and power. Baseline: 20.81sec w/ hands on knees; 02/22/2022 13.37sec w/o UE support Goal status: MET  3.  Pt will demonstrate TUG of </=12 seconds in order to decrease risk of falls and improve functional mobility using LRAD. Baseline:  14.20 sec w/ RW; 02/22/2022 w/o SPC 8.19sec Goal status: MET  4.  Pt will increase BERG balance score to 40/56 to demonstrate improved static balance. Baseline: 36/56; 02/22/2022 50/56 Goal status: MET  5.  Pt will ambulate >/= 1000' feet w/ LRAD on 6MWT to demonstrate improved functional endurance for home and community participation. Baseline: 900' w/ RW; 756' w/ SPC Goal status: IN PROGRESS  6.  Pt will report no pain at rest and during static standing activities to promote improved activity tolerance. Baseline: 3/10; 02/22/2022 R knee pain 3/10  Goal status:  NOT MET  LONG TERM GOALS: Target date: 03/18/2022 (8 weeks); 04/15/2022 (12 weeks)  Pt will report return to neighborhood walking 3 days per week to promote return to prior level of function. Baseline:  She is walking 5 days a week with husband in neighborhood during cooler hours of day. Goal status: MET  2.  Pt will decrease 5xSTS to </=13 seconds w/o UE support in order to demonstrate decreased risk for falls and improved functional bilateral LE strength and power. Baseline: 20.81sec w/ hands on knees; 02/22/2022 13.37sec w/o UE; 03/16/2022 14.09 sec w/o UE support Goal status: IN PROGRESS  3.  Pt will increase BERG balance score to >/=55/56 to demonstrate improved static balance. Baseline: 36/56; 02/22/2022 50/56; 03/16/2022 54/56 Goal status: NOT MET-pt would have met this goal if collar was able to be removed.  4.  Pt will ambulate >/= 1000 feet w/ LRAD on 6MWT to demonstrate improved functional endurance for home and community participation. Baseline:  900' w/ RW; 1075' no AD Goal status: MET  5.  Pt will ambulate >/=1000 feet on various indoor and outdoor surfaces with LRAD and  modified independent level of assist to promote household and community access. Baseline: No distance formally;  calculated on eval.  03/22/2022 1075' no AD; 03/22/2022 16 stairs reciprocally using R rail on descent Goal status: MET  6.  Pt will negotiate  14 stairs w/o UE support and reciprocal steps up and down independently to promote improved safety and independence within home environment. Baseline:  Pt demonstrates hesitancy with descending stairs. Goal status: IN PROGRESS  ASSESSMENT:  CLINICAL IMPRESSION: Continued to address periscapular and shoulder weakness with additions to HEP.  Pt is demonstrating good functional gains in all physical aspects.  She would further benefit from skilled PT to slowly progress weight tolerance and changes in dynamic stability as restrictions are lifted.  OBJECTIVE IMPAIRMENTS Abnormal gait, decreased activity tolerance, decreased balance, decreased endurance, decreased mobility, difficulty walking, decreased strength, impaired UE functional use, and pain.   ACTIVITY LIMITATIONS carrying, lifting, bending, squatting, and stairs  PARTICIPATION LIMITATIONS: driving, community activity, and yard work  PERSONAL FACTORS Age, Fitness, Past/current experiences, Time since onset of injury/illness/exacerbation, and 1-2 comorbidities: chronic pain and DDD  are also affecting patient's functional outcome.   REHAB POTENTIAL: Good  CLINICAL DECISION MAKING: Evolving/moderate complexity  EVALUATION COMPLEXITY: Moderate  PLAN: PT FREQUENCY:  1x/wk for 12 weeks (Discussed pt progress with pt and limitations in husband's schedule due to upcoming school year and dropping to 1x/wk for remainder of visits-was previously 2x/wk for 6 wks followed by 1x/wk for 6 wks)  PT DURATION: 12 weeks (cert 14 weeks - 79/10/8014)  PLANNED INTERVENTIONS: Therapeutic exercises, Therapeutic activity, Neuromuscular re-education, Balance training, Gait training, Patient/Family education, Joint mobilization, Stair training, Vestibular training, DME instructions, Cryotherapy, Moist heat, Manual therapy, and Re-evaluation  PLAN FOR NEXT SESSION:  Pt will be appropriate for FGA once collar is removed and restrictions lifted.  Update HEP prn  - BLE strengthening (R glut/quad) and dynamic balance.  Foam beam tandem walking, bean bag toss on tilt board, reaching outside BOS.  Gait training w/ no AD outside.  Stair management.  SLS-step taps up to 4th stair w/ airex.  Treadmill training w/ incline.  Outside balance on grass/hill-pt has personal goals of going to grasshoppers game and walking dogs independently.  Elease Etienne, PT, DPT Outpatient Neuro [facility] [address] [place], [place] [address] [phone] 03/30/22, 2:29 PM

## 2022-03-30 NOTE — Patient Instructions (Signed)
Access Code: [email or web address] URL: [email or web address] Date: 03/30/2022 Prepared by: Elease Etienne  Exercises - Standing Single Leg Stance with Counter Support  - 1 x daily - 5 x weekly - 1 sets - 3-4 reps - 30 seconds hold - Forward Step Up with Counter Support  - 1 x daily - 5 x weekly - 2 sets - 15 reps - Lateral Step Up with Counter Support  - 1 x daily - 5 x weekly - 2 sets - 10 reps - Side Stepping with Resistance at Thighs and Counter Support  - 1 x daily - 5 x weekly - 2 sets - 10 reps - Standing Hip Abduction with Counter Support  - 1 x daily - 5 x weekly - 2 sets - 10 reps - Standing Hip Extension with Counter Support  - 1 x daily - 5 x weekly - 2 sets - 10 reps - Tandem Walking with Counter Support  - 1 x daily - 4-5 x weekly - 3 sets - 10 reps - Backward Walking with Counter Support  - 1 x daily - 4-5 x weekly - 3 sets - 10 reps - Standing Shoulder Flexion to 90 Degrees with Dumbbells  - 1 x daily - 4-5 x weekly - 2 sets - 10-12 reps - 1-2 seconds hold - Shoulder Abduction with Dumbbells - Thumbs Up  - 1 x daily - 4-5 x weekly - 2 sets - 10-12 reps - 1-2 seconds hold - Supine Scapular Protraction in Flexion with Dumbbells  - 1 x daily - 5 x weekly - 3 sets - 10-12 reps - Standing Lat Pull Down with Resistance - Elbows Bent  - 1 x daily - 5 x weekly - 2-3 sets - 12-15 reps - Single Arm Shoulder Extension with Anchored Resistance  - 1 x daily - 4-5 x weekly - 2-3 sets - 10-15 reps

## 2022-03-31 ENCOUNTER — Other Ambulatory Visit: Payer: Self-pay

## 2022-03-31 NOTE — Telephone Encounter (Signed)
AEX 04/29/21. Scheduled 05/03/22 at 1:30pm with Dr. Quincy Simmonds.

## 2022-04-01 MED ORDER — NONFORMULARY OR COMPOUNDED ITEM
0 refills | Status: DC
Start: 2022-04-01 — End: 2023-01-31

## 2022-04-04 ENCOUNTER — Encounter: Payer: Self-pay | Admitting: Physician Assistant

## 2022-04-04 ENCOUNTER — Ambulatory Visit: Payer: Medicare Other | Attending: Physician Assistant | Admitting: Physician Assistant

## 2022-04-04 VITALS — BP 144/87 | HR 96 | Resp 14 | Ht 63.0 in | Wt 125.8 lb

## 2022-04-04 DIAGNOSIS — Z96642 Presence of left artificial hip joint: Secondary | ICD-10-CM

## 2022-04-04 DIAGNOSIS — M19042 Primary osteoarthritis, left hand: Secondary | ICD-10-CM

## 2022-04-04 DIAGNOSIS — M19041 Primary osteoarthritis, right hand: Secondary | ICD-10-CM | POA: Diagnosis not present

## 2022-04-04 DIAGNOSIS — Z8719 Personal history of other diseases of the digestive system: Secondary | ICD-10-CM

## 2022-04-04 DIAGNOSIS — M5136 Other intervertebral disc degeneration, lumbar region: Secondary | ICD-10-CM

## 2022-04-04 DIAGNOSIS — Z8659 Personal history of other mental and behavioral disorders: Secondary | ICD-10-CM

## 2022-04-04 DIAGNOSIS — M85851 Other specified disorders of bone density and structure, right thigh: Secondary | ICD-10-CM

## 2022-04-04 DIAGNOSIS — G894 Chronic pain syndrome: Secondary | ICD-10-CM

## 2022-04-04 DIAGNOSIS — M4185 Other forms of scoliosis, thoracolumbar region: Secondary | ICD-10-CM

## 2022-04-04 DIAGNOSIS — I7300 Raynaud's syndrome without gangrene: Secondary | ICD-10-CM | POA: Diagnosis not present

## 2022-04-04 DIAGNOSIS — G9332 Myalgic encephalomyelitis/chronic fatigue syndrome: Secondary | ICD-10-CM

## 2022-04-04 DIAGNOSIS — Z8639 Personal history of other endocrine, nutritional and metabolic disease: Secondary | ICD-10-CM

## 2022-04-04 DIAGNOSIS — M797 Fibromyalgia: Secondary | ICD-10-CM

## 2022-04-04 DIAGNOSIS — M51369 Other intervertebral disc degeneration, lumbar region without mention of lumbar back pain or lower extremity pain: Secondary | ICD-10-CM

## 2022-04-04 DIAGNOSIS — Z96651 Presence of right artificial knee joint: Secondary | ICD-10-CM

## 2022-04-04 DIAGNOSIS — M1712 Unilateral primary osteoarthritis, left knee: Secondary | ICD-10-CM

## 2022-04-04 DIAGNOSIS — M5030 Other cervical disc degeneration, unspecified cervical region: Secondary | ICD-10-CM

## 2022-04-04 DIAGNOSIS — M349 Systemic sclerosis, unspecified: Secondary | ICD-10-CM

## 2022-04-04 DIAGNOSIS — Z8269 Family history of other diseases of the musculoskeletal system and connective tissue: Secondary | ICD-10-CM

## 2022-04-04 DIAGNOSIS — R7303 Prediabetes: Secondary | ICD-10-CM

## 2022-04-04 DIAGNOSIS — R5383 Other fatigue: Secondary | ICD-10-CM

## 2022-04-04 NOTE — Telephone Encounter (Signed)
Rx called in to pharmacist.

## 2022-04-05 ENCOUNTER — Ambulatory Visit: Payer: Medicare Other | Admitting: Occupational Therapy

## 2022-04-05 ENCOUNTER — Encounter: Payer: Self-pay | Admitting: Physical Therapy

## 2022-04-05 ENCOUNTER — Ambulatory Visit: Payer: Medicare Other | Admitting: Physical Therapy

## 2022-04-05 DIAGNOSIS — R208 Other disturbances of skin sensation: Secondary | ICD-10-CM

## 2022-04-05 DIAGNOSIS — M25661 Stiffness of right knee, not elsewhere classified: Secondary | ICD-10-CM

## 2022-04-05 DIAGNOSIS — M6281 Muscle weakness (generalized): Secondary | ICD-10-CM

## 2022-04-05 DIAGNOSIS — R2681 Unsteadiness on feet: Secondary | ICD-10-CM

## 2022-04-05 DIAGNOSIS — R2689 Other abnormalities of gait and mobility: Secondary | ICD-10-CM

## 2022-04-05 DIAGNOSIS — R278 Other lack of coordination: Secondary | ICD-10-CM | POA: Diagnosis not present

## 2022-04-05 DIAGNOSIS — M79602 Pain in left arm: Secondary | ICD-10-CM

## 2022-04-05 NOTE — Therapy (Unsigned)
OUTPATIENT OCCUPATIONAL THERAPY NEURO TREATMENT  Patient Name: Rachel Duran MRN: [id number] DOB:05/13/1956, 66 y.o., female Today's Date: 04/05/2022  PCP: Gutierrez, Javier, MD  REFERRING PROVIDER: Lovorn, Megan, MD    OT End of Session - 04/05/22 1435     Visit Number 8    Number of Visits 17    Date for OT Re-Evaluation 05/05/22    Authorization Type U[employer] MCR    OT Start Time 1402    OT Stop Time 1442    OT Time Calculation (min) 40 min    Activity Tolerance Patient tolerated treatment well    Behavior During Therapy WFL for tasks assessed/performed               Past Medical History:  Diagnosis Date   Abdominal pain last 4 months   and nausea also   Allergy    Anemia    history of   Anxiety    Bipolar disorder (HCC)    atpical bipolar disorder   Cervical disc disease limited rom turning to left   hx. C6- C7 -hx. past fusion(bone graft used)   Cholecystitis    Chronic pain    DDD (degenerative disc disease), lumbar 09/2015   dextroscoliosis with multilevel DDD and facet arthrosis most notable for R foraminal disc protrusion L4/5 producing severe R neural foraminal stenosis abutting R L4 nerve root, moderate spinal canal and mild lat recesss and R neural foraminal stenosis L3/4 (MRI)   Depression    bipolar depression   Disorders of porphyrin metabolism    Felon of finger of left hand 11/10/2016   Fibromyalgia    GERD (gastroesophageal reflux disease)    Headache    occasionally   Internal hemorrhoids    Interstitial cystitis 06/06/2012   hx.   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 (Dr Sanders retinologist)   Tuberculosis    Past Surgical History:  Procedure Laterality Date   ANTERIOR CERVICAL DECOMP/DISCECTOMY FUSION  2004   C5/6, C6/7   ANTERIOR CERVICAL DECOMP/DISCECTOMY FUSION  02/2016   C3/4, C4/5 with plating (Jenkins)   ANTERIOR CERVICAL DECOMP/DISCECTOMY FUSION  12/29/2021   Procedure: ANTERIOR CERVICAL DECOMPRESSION/ DISCECTOMY FUSION CERVICAL THREE - SEVEN;  Surgeon: Jenkins, Jeffrey, MD;  Location: [facility];  Service: Neurosurgery;;   ANTERIOR CERVICAL DECOMPRESSION/DISCECTOMY FUSION 4 LEVELS N/A 12/29/2021   Procedure: CERVICAL FIVE-SIX CORPECTOMY,;  Surgeon: Jenkins, Jeffrey, MD;  Location: [facility] OR;  Service: Neurosurgery;  Laterality: N/A;   AUGMENTATION MAMMAPLASTY Bilateral 03/25/2010   BREAST ENHANCEMENT SURGERY  2010   BREAST IMPLANT EXCHANGE  10/2014   exchange saline implants, B mastopexy/capsulorraphy ([facility])   BUNIONECTOMY Bilateral yrs ago   CESAREAN SECTION  1985   x 1   CHOLECYSTECTOMY  06/11/2012   Procedure: LAPAROSCOPIC CHOLECYSTECTOMY WITH INTRAOPERATIVE CHOLANGIOGRAM;  Surgeon: Eric M Wilson, MD,FACS;  Location: [facility];  Service: General;  Laterality: N/A;   COLONOSCOPY  02/2018   done for positive cologuard - WNL, rpt 10 yrs (Stark)     CYSTOSCOPY     ERCP  05/22/2012   Procedure: ENDOSCOPIC RETROGRADE CHOLANGIOPANCREATOGRAPHY (ERCP);  Surgeon: Malcolm T Stark, MD,FACG;  Location: [facility];  Service: Endoscopy;  Laterality: N/A;   ERCP N/A 09/17/2013   Procedure: ENDOSCOPIC RETROGRADE CHOLANGIOPANCREATOGRAPHY (ERCP);  Surgeon: Malcolm T Stark, MD;  Location: [facility];  Service: Endoscopy;  Laterality: N/A;   ERCP N/A 09/27/2018   Procedure: ENDOSCOPIC RETROGRADE CHOLANGIOPANCREATOGRAPHY (ERCP);  Surgeon: Stark, Malcolm T, MD;  Location: [facility];  Service: Endoscopy;  Laterality: N/A;   ESOPHAGOGASTRODUODENOSCOPY  09/2016   WNL. esophagus dilated  (Stark)   HEMORRHOID BANDING  09/23/2013   --Dr. Eric Wilson   HERNIA REPAIR     inguinal   HYSTEROSCOPY W/ ENDOMETRIAL ABLATION     KNEE ARTHROSCOPY Right 04/06/2021   Procedure: RIGHT KNEE ARTHROSCOPY WITH DEBRIDEMENT;  Surgeon: Dean, Gregory Scott, MD;  Location: [facility];  Service: Orthopedics;  Laterality: Right;   NASAL SINUS SURGERY     x5   PARTIAL KNEE ARTHROPLASTY Right 06/01/2020   Procedure: Right knee patellofemoral replacement;  Surgeon: Dean, Gregory Scott, MD;  Location: [facility];  Service: Orthopedics;  Laterality: Right;   POSTERIOR CERVICAL FUSION/FORAMINOTOMY N/A 12/29/2021   Procedure: POSTERIOR CERVICAL FUSION CERVICAL THREE-SEVEN;  Surgeon: Jenkins, Jeffrey, MD;  Location: [facility];  Service: Neurosurgery;  Laterality: N/A;   REMOVAL OF STONES  09/27/2018   Procedure: REMOVAL OF STONES;  Surgeon: Stark, Malcolm T, MD;  Location: [facility];  Service: Endoscopy;;   SPHINCTEROTOMY  09/27/2018   Procedure: SPHINCTEROTOMY;  Surgeon: Stark, Malcolm T, MD;  Location: [facility];  Service: Endoscopy;;   TONSILLECTOMY     removed as a child   TOTAL HIP ARTHROPLASTY Left 03/12/2019   Procedure: LEFT TOTAL HIP ARTHROPLASTY ANTERIOR APPROACH;  (Blackman, Christopher Y, MD)   UPPER GASTROINTESTINAL ENDOSCOPY     Patient Active Problem List   Diagnosis Date Noted   Fusion of spine, cervical region    Anemia    Primary hypertension    Cervical spine arthritis with nerve pain 12/29/2021   C6 cervical fracture (HCC) 12/29/2021   C5 cervical fracture (HCC) 12/21/2021   Advanced directives, counseling/discussion 09/23/2021   Swelling of right index finger 07/14/2021   Synovitis of right knee    Scoliosis 01/13/2021   Osteopenia 07/02/2020   Status post left knee surgery 06/01/2020   Pulmonary nodule 05/27/2020   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   Chronic patellofemoral pain of right knee 05/05/2020   Positive ANA (antinuclear  antibody) 06/05/2019   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   Unilateral primary osteoarthritis, left hip 01/28/2019   Pelvic pain 12/12/2018   Common bile duct (CBD) obstruction    Venous insufficiency of left lower extremity 07/04/2018   Dyslipidemia 09/22/2017   DDD (degenerative disc disease), cervical 04/03/2017   DDD (degenerative disc disease), lumbar 04/03/2017   Onychomycosis 03/21/2017   Dysphagia 10/18/2016   Encounter for chronic pain management 10/18/2016   Biliary stasis 09/26/2016   HNP (herniated nucleus pulposus), lumbar 09/23/2015   Iron deficiency 07/30/2015   Health maintenance examination 06/15/2015   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   Clavicle enlargement 12/13/2014   Prediabetes 12/13/2014   Plantar fasciitis, bilateral 09/11/2014   Medicare annual wellness visit, subsequent 06/10/2014   Abnormal thyroid function test 06/10/2014   Chronic pain syndrome 06/10/2014   CFS (chronic fatigue syndrome) 04/08/2014   Postmenopausal atrophic vaginitis 10/19/2012   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   Cervical disc   disorder with radiculopathy of cervical region 05/28/2010   APHTHOUS ULCERS 01/31/2008   Insomnia 11/09/2007   Constipation 10/11/2007   Allergic rhinitis 04/12/2007   MDD (major depressive disorder), recurrent episode, moderate ([place]) 03/05/2007   [name] syndrome 03/05/2007   GERD 03/05/2007   ROSACEA 03/05/2007   NEURALGIA 03/05/2007   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[place]) 12/06/2006   Chronic interstitial cystitis 12/06/2006   Fibromyalgia 12/06/2006    ONSET DATE: 01/12/2022 (Referral date), surgery 12/29/21 and revision on 12/30/21  REFERRING DIAG:  M54.10 (ICD-10-CM) - Radiculopathy, site unspecified  M43.22 (ICD-10-CM) - Fusion of spine, cervical region    THERAPY DIAG:  Other lack of coordination  Muscle weakness (generalized)  Unsteadiness on feet  Other abnormalities of gait and mobility  Other  disturbances of skin sensation  Pain in left arm  Stiffness of right knee, not elsewhere classified  Rationale for Evaluation and Treatment Rehabilitation  SUBJECTIVE:   SUBJECTIVE STATEMENT:  Pt reports extreme sensitivity to hot/cold at thumb and index finger Lt hand only.   Pt accompanied by: self  PERTINENT HISTORY:  Chronic pain, [name] syndrome, GERD, cervical radiculopathy, fibromyalgia, scleroderma, herniated lumbar disc L4/L5, DDD, Partial right knee arthroplasty, scoliosis, Lt THA, interstitial cystitis From rehab admit note:  "ACDF 2004 C5-6 and C6-7 and 2017 C3-4 and C4-5, left total hip arthroplasty 2020, scleroderma.  Presented 12/29/2021 with noted history of fall 10/29/2021 suffering a C5-6 fracture subluxation/cervical kyphosis with persistent neck pain and initial conservative care with failed medical management.  Underwent 3 stage anterior posterior fusion C5-6 corpectomy, posterior fusion C3-7, ACDF C3-7 12/29/2021.  Postop she developed left hand weakness consistent with a C8 radiculopathy as well as small amount of epidural fluid at C5-6.  Cervical CT demonstrated that the left T1 pedicle screw was a bit low and into the neural foramen.  Patient underwent revision of posterior cervical instrumentation 12/30/2021 per Dr. Newman Pies.  Placed in a cervical hard collar when out of bed."   PRECAUTIONS: Cervical and Fall-cervical hard collar when OOB, cleared for overhead reach and light strengthening(5 lbs or less) collar until 04/11/22  PAIN:  Are you having pain? Yes: NPRS scale: 2/10 Pain location: Neck and Lt arm Pain description: Lt tingling/numb and achy from elbow to hand, throbbing in Lt shoulder blade. Pt also reports intense pain w/ hot and cold Lt index and thumb Aggravating factors: dressing, exercise Relieving factors: rest, elevation, meds   PATIENT GOALS get Rt arm a little stronger, writing, and get Lt arm better and stronger  OBJECTIVE:   HAND  DOMINANCE: Right   TODAY'S TREATMENT:  04/05/22   Cane exercises for shoulder flexion, chest press and shoulder abduction 10 reps each min v.c in front of mirror. Pt was instructed in red theraband exercises, min v.c and pt returned demonstration. Arm bike x 5 mins level 1 for conditioning  HOME EXERCISE PROGRAM: 02/04/22 - yellow theraputty Access Code: [email or web address] 02/22/22: coordination HEP, upgraded to red putty HEP  04/05/22 cane exercises, red theraband   PATIENT EDUCATION: Education details: cane exercises, red theraband exercises Person educated: Patient Education method: Explanation, demonstration Education comprehension: verbalized understanding, returned demonstration, and verbal cues required   GOALS: Goals reviewed with patient? Yes  SHORT TERM GOALS: Target date: 03/05/22  Independent w/ HEP for bilateral hand coordination and strength (more focus on LUE)  Baseline: Goal status: MET  2.  Pt to write short paragraph maintaining 90% or greater legibility Baseline:  Goal status: MET  3.  Pt to  cut food mod I w/ AE prn Baseline:  Goal status: MET reports cutting up food independently this week  4.  Pt to perform bathing w/ direct supervision only using AE prn Baseline:  Goal status: MET  5.  Pt to make simple snack/sandwich w/ supervision Baseline:  Goal status: MET   LONG TERM GOALS: Target date: 05/05/22  Independent w/ updated BUE strengthening HEP  Baseline:  Goal status: INITIAL  2.  Pt to improve coordination bilaterally as evidenced by performing 9 hole peg test in 23 sec or less Rt hand, and 27 sec or less Lt hand Baseline: Rt = 26.78 sec, Lt = 31.28 sec Goal status: INITIAL  3.  Pt to improve grip strength by 5 lbs Rt hand, by 10 lbs Lt hand Baseline: Rt = 46 lbs, Lt = 29 lbs Goal status: INITIAL  4.  Pt to return to simple stovetop cooking with supervision Baseline:  Goal status: IN PROGRESS reports making spaghetti and other meals   5.  Pt  to perform light IADLS (laundry, making bed, washing dishes) at mod I level Baseline:  Goal status: INITIAL   ASSESSMENT:  CLINICAL IMPRESSION: Pt is progressing towards goals, she has been cleared for overhead reaching and light strengthening less than 5 lbs. She demonstrates understanding of updated HEP. Pt is progressing towards goals . She has been cleared for light strengthening and overhead reach. Therapist issued updated HEP. PERFORMANCE DEFICITS in functional skills including ADLs, IADLs, coordination, dexterity, sensation, ROM, strength, pain, FMC, mobility, balance, body mechanics, endurance, decreased knowledge of precautions, decreased knowledge of use of DME, and UE functional use.   IMPAIRMENTS are limiting patient from ADLs, IADLs, leisure, and social participation.   COMORBIDITIES has co-morbidities such as scleroderma, Raynauds, scoliosis, OA  that affects occupational performance. Patient will benefit from skilled OT to address above impairments and improve overall function.  MODIFICATION OR ASSISTANCE TO COMPLETE EVALUATION: Min-Moderate modification of tasks or assist with assess necessary to complete an evaluation.  OT OCCUPATIONAL PROFILE AND HISTORY: Detailed assessment: Review of records and additional review of physical, cognitive, psychosocial history related to current functional performance.  CLINICAL DECISION MAKING: Moderate - several treatment options, min-mod task modification necessary  REHAB POTENTIAL: Good  EVALUATION COMPLEXITY: Moderate    PLAN: OT FREQUENCY: 2x/week  OT DURATION: 12 weeks (anticipate only 8-10 weeks needed)   PLANNED INTERVENTIONS: self care/ADL training, therapeutic exercise, therapeutic activity, neuromuscular re-education, passive range of motion, functional mobility training, splinting, paraffin, fluidotherapy, moist heat, cryotherapy, patient/family education, energy conservation, coping strategies training, DME and/or AE  instructions, and Re-evaluation  RECOMMENDED OTHER SERVICES: NONE  CONSULTED AND AGREED WITH PLAN OF CARE: Patient  PLAN FOR NEXT SESSION: review updated HEP and progress.   ,, OT 04/05/2022, 2:44 PM  , OTR/L [phone] Phone: [phone] 4:08 PM 04/06/22           

## 2022-04-05 NOTE — Patient Instructions (Addendum)
   ROM: Abduction - Wand   Holding wand with left hand palm up, push wand directly out to side, leading with other hand palm down, until stretch is felt. Hold 5 seconds. Repeat 10 times per set. Do 2-3 sessions per day.      Cane Overhead - Standing   With arms straight, hold cane forward at waist. Raise cane above head. Hold 3 seconds.  Repeat 10 times. Do 2 times per day.    Strengthening: Resisted Flexion   Hold tubing with ___one__ arm(s) at side. Pull forward and up. Move shoulder through pain-free range of motion. Repeat __10__ times per set.  Do _1-2_ sessions per day , every other day repeat with other arm   Strengthening: Resisted Extension   Hold tubing in _one____ hand(s), arm forward. Pull arm back, elbow straight. Repeat _10___ times per set. Do _1-2___ sessions per day, every other day. Repeat with other arm   Resisted Horizontal Abduction: Bilateral   Sit or stand, tubing in both hands, arms out in front. Keeping arms straight, pinch shoulder blades together and stretch arms out. Repeat _10___ times per set. Do _1-2___ sessions per day, every other day.   Elbow Flexion: Resisted   With tubing held in _one_____ hand(s) and other end secured under foot, curl arm up as far as possible. Repeat _10___ times per set. Do _1-2___ sessions per day, every other day.    Elbow Extension: Resisted   Sit in chair with resistive band secured at armrest (or hold with other hand) and __one_____ elbow bent. Straighten elbow. Repeat _10___ times per set.  Do _1-2___ sessions per day, every other day.   Copyright  VHI. All rights reserved.

## 2022-04-05 NOTE — Therapy (Signed)
OUTPATIENT PHYSICAL THERAPY NEURO TREATMENT   Patient Name: Rachel Dur[facility] MRN: [id number] DOB:11-19-1955, 66 y.o., female Today's Date: 04/05/2022   PCP: Ria Bush, MD REFERRING PROVIDER: Courtney Heys, MD   PT progress note for Rachel [facility].  Reporting period 01/20/2022 to 03/22/2022  See Note below for Objective Data and Assessment of Progress/Goals  Thank you for the referral of this patient. Elease Etienne, PT, DPT    PT End of Session - 04/05/22 1320     Visit Number 12    Number of Visits 19   18 + eval   Date for PT Re-Evaluation 04/29/22   pushed out for scheduling   Authorization Type [employer]IC[employer]    Progress Note Due on Visit 10    PT Start Time 1317    PT Stop Time 1401    PT Time Calculation (min) 44 min    Equipment Utilized During Treatment Gait belt;Cervical collar    Activity Tolerance Patient tolerated treatment well    Behavior During Therapy WFL for tasks assessed/performed                Past Medical History:  Diagnosis Date   Abdominal pain last 4 months   and nausea also   Allergy    Anemia    history of   Anxiety    Bipolar disorder ([place])    atpical bipolar disorder   Cervical disc disease limited rom turning to left   hx. C6- C7 -hx. past fusion(bone graft used)   Cholecystitis    Chronic pain    DDD (degenerative disc disease), lumbar 09/2015   dextroscoliosis with multilevel DDD and facet arthrosis most notable for R foraminal disc protrusion L4/5 producing severe R neural foraminal stenosis abutting R L4 nerve root, moderate spinal canal and mild lat recesss and R neural foraminal stenosis L3/4 (MRI)   Depression    bipolar depression   Disorders of porphyrin metabolism    Felon of finger of left hand 11/10/2016   Fibromyalgia    GERD (gastroesophageal reflux disease)    Headache    occasionally   Internal hemorrhoids    Interstitial cystitis 06/06/2012   hx.   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   hereditary macular degeneration (Dr Rachel [facility]an retinologist)   Tuberculosis    Past Surgical History:  Procedure Laterality Date   ANTERIOR CERVICAL DECOMP/DISCECTOMY FUSION  2004   C5/6, C6/7   ANTERIOR CERVICAL DECOMP/DISCECTOMY FUSION  02/2016   C3/4, C4/5 with plating Rachel [facility])   ANTERIOR CERVICAL DECOMP/DISCECTOMY FUSION  12/29/2021   Procedure: ANTERIOR CERVICAL DECOMPRESSION/ DISCECTOMY FUSION CERVICAL THREE - SEVEN;  Surgeon: Rachel Pies, MD;  Location: [facility];  Service: Neurosurgery;;   ANTERIOR CERVICAL DECOMPRESSION/DISCECTOMY FUSION 4 LEVELS N/A 12/29/2021   Procedure: CERVICAL FIVE-SIX CORPECTOMY,;  Surgeon: Rachel Pies, MD;  Location: [facility];  Service: Neurosurgery;  Laterality: N/A;   AUGMENTATION MAMMAPLASTY Bilateral 03/25/2010   BREAST ENHANCEMENT SURGERY  2010   BREAST IMPLANT EXCHANGE  10/2014  exchange saline implants, B mastopexy/capsulorraphy (Rachel [facility]an)   BUNIONECTOMY Bilateral yrs ago   Rachel [facility]   x 1   CHOLECYSTECTOMY  06/11/2012   Procedure: LAPAROSCOPIC CHOLECYSTECTOMY WITH INTRAOPERATIVE CHOLANGIOGRAM;  Surgeon: Rachel Curry, MD,FACS;  Location: [facility];  Service: General;  Laterality: N/A;   COLONOSCOPY  02/2018   done for positive cologuard - WNL, rpt 10 yrs Rachel [facility])   CYSTOSCOPY     ERCP  05/22/2012   Procedure: ENDOSCOPIC RETROGRADE CHOLANGIOPANCREATOGRAPHY (ERCP);  Surgeon: Rachel Artist, MD,FACG;  Location: [facility];  Service: Endoscopy;  Laterality: N/A;   ERCP N/A 09/17/2013   Procedure: ENDOSCOPIC  RETROGRADE CHOLANGIOPANCREATOGRAPHY (ERCP);  Surgeon: Rachel Artist, MD;  Location: [facility];  Service: Endoscopy;  Laterality: N/A;   ERCP N/A 09/27/2018   Procedure: ENDOSCOPIC RETROGRADE CHOLANGIOPANCREATOGRAPHY (ERCP);  Surgeon: Rachel Artist, MD;  Location: [facility];  Service: Endoscopy;  Laterality: N/A;   ESOPHAGOGASTRODUODENOSCOPY  09/2016   WNL. esophagus dilated Rachel [facility])   HEMORRHOID BANDING  09/23/2013   --Dr. Greer [facility]   HERNIA REPAIR     inguinal   HYSTEROSCOPY W/ ENDOMETRIAL ABLATION     KNEE ARTHROSCOPY Right 04/06/2021   Procedure: RIGHT KNEE ARTHROSCOPY WITH DEBRIDEMENT;  Surgeon: [name], MD;  Location: [facility];  Service: Orthopedics;  Laterality: Right;   NASAL SINUS SURGERY     x5   PARTIAL KNEE ARTHROPLASTY Right 06/01/2020   Procedure: Right knee patellofemoral replacement;  Surgeon: [name], MD;  Location: [facility];  Service: Orthopedics;  Laterality: Right;   POSTERIOR CERVICAL FUSION/FORAMINOTOMY N/A 12/29/2021   Procedure: POSTERIOR CERVICAL FUSION CERVICAL THREE-SEVEN;  Surgeon: Rachel Pies, MD;  Location: [facility];  Service: Neurosurgery;  Laterality: N/A;   REMOVAL OF STONES  09/27/2018   Procedure: REMOVAL OF STONES;  Surgeon: Rachel Artist, MD;  Location[facility];  Service: Endoscopy;;   SPHINCTEROTOMY  09/27/2018   Procedure: SPHINCTEROTOMY;  Surgeon: Rachel Artist, MD;  [facility];  Service: Endoscopy;;   TONSILLECTOMY     removed as a child   TOTAL HIP ARTHROPLASTY Left 03/12/2019   Procedure: LEFT TOTAL HIP ARTHROPLASTY ANTERIOR APPROACH;  Rachel [facility], Rachel Guest, MD)   UPPER GASTROINTESTINAL ENDOSCOPY     Patient Active Problem List   Diagnosis Date Noted   Fusion of spine, cervical region    Anemia    Primary hypertension    Cervical spine arthritis with nerve pain 12/29/2021   C6 cervical fracture ([place]) 12/29/2021   C5 cervical fracture ([place])  12/21/2021   Advanced directives, counseling/discussion 09/23/2021   Swelling of right index finger 07/14/2021   Synovitis of right knee    Scoliosis 01/13/2021   Osteopenia 07/02/2020   Status post left knee surgery 06/01/2020   Pulmonary nodule 05/27/2020   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   Chronic patellofemoral pain of right knee 05/05/2020   Positive ANA (antinuclear antibody) 06/05/2019   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   Unilateral primary osteoarthritis, left hip 01/28/2019   Pelvic pain 12/12/2018   Common bile duct (CBD) obstruction    Venous insufficiency of left lower extremity 07/04/2018   Dyslipidemia 09/22/2017   DDD (degenerative disc disease), cervical 04/03/2017   DDD (degenerative disc disease), lumbar 04/03/2017   Onychomycosis 03/21/2017   Dysphagia 10/18/2016   Encounter for chronic pain management 10/18/2016   Biliary stasis 09/26/2016   HNP (herniated nucleus pulposus), lumbar 09/23/2015   Iron deficiency 07/30/2015  Health maintenance examination 06/15/2015   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   Clavicle enlargement 12/13/2014   Prediabetes 12/13/2014   Plantar fasciitis, bilateral 09/11/2014   Medicare annual wellness visit, subsequent 06/10/2014   Abnormal thyroid function test 06/10/2014   Chronic pain syndrome 06/10/2014   CFS (chronic fatigue syndrome) 04/08/2014   Postmenopausal atrophic vaginitis 10/19/2012   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   Cervical disc disorder with radiculopathy of cervical region 05/28/2010   APHTHOUS ULCERS 01/31/2008   Insomnia 11/09/2007   Constipation 10/11/2007   Allergic rhinitis 04/12/2007   MDD (major depressive disorder), recurrent episode, moderate ([place]) 03/05/2007   [name] syndrome 03/05/2007   GERD 03/05/2007   ROSACEA 03/05/2007   NEURALGIA 03/05/2007   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[place]) 12/06/2006   Chronic interstitial cystitis 12/06/2006    Fibromyalgia 12/06/2006    ONSET DATE: 01/12/2022 (referral)  REFERRING DIAG: M54.10 (ICD-10-CM) - Radiculopathy, site unspecified M43.22 (ICD-10-CM) - Fusion of spine, cervical region   THERAPY DIAG:  Other lack of coordination  Muscle weakness (generalized)  Unsteadiness on feet  Other abnormalities of gait and mobility  Rationale for Evaluation and Treatment Rehabilitation   PERTINENT HISTORY: Chronic pain, [name] syndrome, GERD, cervical radiculopathy, fibromyalgia, hereditary macular degeneration (she has most trouble with peripheral vision), herniated lumbar disc L4/L5, DDD, Partial right knee arthroplasty, scoliosis  From rehab admit note:  "ACDF 2004 C5-6 and C6-7 and 2017 C3-4 and C4-5, left total hip arthroplasty 2020, scleroderma.  Presented 12/29/2021 with noted history of fall 10/29/2021 suffering a C5-6 fracture subluxation/cervical kyphosis with persistent neck pain and initial conservative care with failed medical management.  Underwent 3 stage anterior posterior fusion C5-6 corpectomy, posterior fusion C3-7, ACDF C3-7 12/29/2021.  Postop she developed left hand weakness consistent with a C8 radiculopathy as well as small amount of epidural fluid at C5-6.  Cervical CT demonstrated that the left T1 pedicle screw was a bit low and into the neural foramen.  Patient underwent revision of posterior cervical instrumentation 12/30/2021 per Dr. Newman [facility].  Placed in a cervical hard collar when out of bed."    PRECAUTIONS: Cervical and Fall-cervical hard collar when OOB -updated precautions scanned into other media 03/16/2022.   SUBJECTIVE: Pt intends to follow-up w/ Dr. Dagoberto Ligas about added Lyrica due to dry mouth and peeling lips.  She inquires about time to remove collar and if face-to-face w/ MD needed before removing it on date written on precautions (Sept 18) vs leaving collar on until appt in Oct.  (PT to reach out to Dr. Arnoldo [facility] office).  PAIN:  Endorses same pain from prior session. Are you having pain? Yes: NPRS scale: 2/10 Pain location: left index and thumb and right knee Pain description: N/T w/ hypersensitivity in left thumb and forefinger Aggravating factors: touching cold/hot Relieving factors: walking (for knee) Pt states additional lyrica has not helped symptoms. TODAY'S TREATMENT:  Readjusted cervical collar to properly support head and neck per pt request, discussed proper fit and signs on needed readjustment.  Discussion on return to aquatic activity as pt did prior to surgery for knee OA with PT deferring full clearance to MD due to ensuring safety with cervical healing and wound closure prior to returning to public aquatic space.  On anteriorly oriented tilt board in // bars w/o UE support holding board level > ipsilateral reach to retrieve right oriented bean bags from chest height and toss to far target x1 round > repeated w/ left reach and toss x2 rounds  Crossbody retrieval of bean bag using LUE performing STS on airex and toss to far targets, pt matches bean bag color to dot target color > repeated with inc distance to target for inc perturbation  GAIT: Gait pattern: WFL and step through pattern Distance walked: 800' Assistive device utilized: None Level of assistance: Complete Independence Comments: Pt carries water bottle, no overt LOB, able to navigate curb step x1 independently w/ bend at waist to visualize height of step.  Pt reporting no fatigue upon completion of activity.  Her arm swing is much improved on side not carrying water bottle.   PATIENT EDUCATION: Education details: Continue regular walking (pt has self-progressed tolerance well-is walking in neighborhood without holding onto husband).  Continue HEP.  PT talks to OT and patient at end of  session about review and modification to shoulder HEP initiated by PT as necessary to progress shoulder strength and mobility.  Discussion of potentially holding PT if cervical ROM and strengthening precautions still in place upon removing collar as pt has progressed well towards all goals and is beginning to plateau in progress due to cervical limitations. Person educated: Patient Education method: Explanation Education comprehension: verbalized understanding  HOME EXERCISE PROGRAM: Access Code: [email or web address]  GOALS: Goals reviewed with patient? Yes  SHORT TERM GOALS: Target date: 02/18/2022  Pt will be independent with strength and balance HEP with supervision from family as needed. Baseline:  Established, pt compliant, would benefit from progressions. Goal status: MET  2.  Pt will decrease 5xSTS to </=17 seconds w/o UE support in order to demonstrate decreased risk for falls and improved functional bilateral LE strength and power. Baseline: 20.81sec w/ hands on knees; 02/22/2022 13.37sec w/o UE support Goal status: MET  3.  Pt will demonstrate TUG of </=12 seconds in order to decrease risk of falls and improve functional mobility using LRAD. Baseline: 14.20 sec w/ RW; 02/22/2022 w/o SPC 8.19sec Goal status: MET  4.  Pt will increase BERG balance score to 40/56 to demonstrate improved static balance. Baseline: 36/56; 02/22/2022 50/56 Goal status: MET  5.  Pt will ambulate >/= 1000' feet w/ LRAD on 6MWT to demonstrate improved functional endurance for home and community participation. Baseline: 900' w/ RW; 756' w/ SPC Goal status: IN PROGRESS  6.  Pt will report no pain at rest and during static standing activities to promote improved activity tolerance. Baseline: 3/10; 02/22/2022 R knee pain 3/10  Goal status:  NOT MET  LONG TERM GOALS: Target date: 03/18/2022 (8 weeks); 04/15/2022 (12 weeks)  Pt will report return to neighborhood walking 3  days per week to promote return to prior level of  function. Baseline:  She is walking 5 days a week with husband in neighborhood during cooler hours of day. Goal status: MET  2.  Pt will decrease 5xSTS to </=13 seconds w/o UE support in order to demonstrate decreased risk for falls and improved functional bilateral LE strength and power. Baseline: 20.81sec w/ hands on knees; 02/22/2022 13.37sec w/o UE; 03/16/2022 14.09 sec w/o UE support Goal status: IN PROGRESS  3.  Pt will increase BERG balance score to >/=55/56 to demonstrate improved static balance. Baseline: 36/56; 02/22/2022 50/56; 03/16/2022 54/56 Goal status: NOT MET-pt would have met this goal if collar was able to be removed.  4.  Pt will ambulate >/= 1000 feet w/ LRAD on 6MWT to demonstrate improved functional endurance for home and community participation. Baseline:  900' w/ RW; 1075' no AD Goal status: MET  5.  Pt will ambulate >/=1000 feet on various indoor and outdoor surfaces with LRAD and modified independent level of assist to promote household and community access. Baseline: No distance formally;  calculated on eval.  03/22/2022 1075' no AD; 03/22/2022 16 stairs reciprocally using R rail on descent Goal status: MET  6.  Pt will negotiate 14 stairs w/o UE support and reciprocal steps up and down independently to promote improved safety and independence within home environment. Baseline:  Pt demonstrates hesitancy with descending stairs. Goal status: IN PROGRESS  ASSESSMENT:  CLINICAL IMPRESSION: Pt continues to progress in UE functional use and dynamic balance.  She remains limited by cervical precautions with cervical collar remaining in place when out of bed.  PT may place pt on hold if collar and precautions not lifted in coming visits due to patient being ready for higher level mobility tasks requiring improved cervical movement.  OBJECTIVE IMPAIRMENTS Abnormal gait, decreased activity tolerance, decreased balance, decreased endurance, decreased mobility, difficulty  walking, decreased strength, impaired UE functional use, and pain.   ACTIVITY LIMITATIONS carrying, lifting, bending, squatting, and stairs  PARTICIPATION LIMITATIONS: driving, community activity, and yard work  PERSONAL FACTORS Age, Fitness, Past/current experiences, Time since onset of injury/illness/exacerbation, and 1-2 comorbidities: chronic pain and DDD  are also affecting patient's functional outcome.   REHAB POTENTIAL: Good  CLINICAL DECISION MAKING: Evolving/moderate complexity  EVALUATION COMPLEXITY: Moderate  Duran: PT FREQUENCY:  1x/wk for 12 weeks (Discussed pt progress with pt and limitations in husband's schedule due to upcoming school year and dropping to 1x/wk for remainder of visits-was previously 2x/wk for 6 wks followed by 1x/wk for 6 wks)  PT DURATION: 12 weeks (cert 14 weeks - 44/02/1855)  PLANNED INTERVENTIONS: Therapeutic exercises, Therapeutic activity, Neuromuscular re-education, Balance training, Gait training, Patient/Family education, Joint mobilization, Stair training, Vestibular training, DME instructions, Cryotherapy, Moist heat, Manual therapy, and Re-evaluation  Duran FOR NEXT SESSION:  Pt will be appropriate for FGA once collar is removed and restrictions lifted.  Update HEP prn - BLE strengthening (R glut/quad) and dynamic balance.  Foam beam tandem walking, bean bag toss on tilt board, reaching outside BOS.  Gait training w/ no AD outside.  Stair management.  SLS-step taps up to 4th stair w/ airex.  Treadmill training w/ incline.  Stair descent no UE support.  Outside balance on grass/hill-pt has personal goals of going to grasshoppers game and walking dogs independently.  Elease Etienne, PT, DPT Outpatient Neuro [facility] [address], [place], [place] [address] [phone] 04/05/22, 5:10 PM

## 2022-04-06 ENCOUNTER — Ambulatory Visit: Payer: Medicare Other | Admitting: Physical Therapy

## 2022-04-08 ENCOUNTER — Telehealth: Payer: Self-pay | Admitting: Physical Therapy

## 2022-04-08 NOTE — Telephone Encounter (Signed)
Entered in error

## 2022-04-13 ENCOUNTER — Encounter: Payer: Medicare Other | Admitting: Occupational Therapy

## 2022-04-13 ENCOUNTER — Ambulatory Visit: Payer: Medicare Other | Admitting: Physical Therapy

## 2022-04-18 ENCOUNTER — Other Ambulatory Visit: Payer: Self-pay | Admitting: Family Medicine

## 2022-04-19 NOTE — Telephone Encounter (Signed)
Name of Medication: Eszopiclone Name of Pharmacy: [facility] or Written Date and Quantity: 01/21/22, #90 Last Office Visit and Type: 03/23/22, chronic pain mgmt Next Office Visit and Type: 04/20/22, R great toe pain Last Controlled Substance Agreement Date: 04/19/19 Last UDS: 04/19/19

## 2022-04-20 ENCOUNTER — Encounter: Payer: Self-pay | Admitting: Occupational Therapy

## 2022-04-20 ENCOUNTER — Encounter: Payer: Self-pay | Admitting: Family Medicine

## 2022-04-20 ENCOUNTER — Ambulatory Visit: Payer: Medicare Other | Admitting: Physical Therapy

## 2022-04-20 ENCOUNTER — Ambulatory Visit (INDEPENDENT_AMBULATORY_CARE_PROVIDER_SITE_OTHER): Payer: Medicare Other | Admitting: Family Medicine

## 2022-04-20 ENCOUNTER — Ambulatory Visit: Payer: Medicare Other | Admitting: Occupational Therapy

## 2022-04-20 VITALS — BP 136/82 | HR 102 | Temp 97.7°F | Ht 63.0 in | Wt 123.4 lb

## 2022-04-20 DIAGNOSIS — M79602 Pain in left arm: Secondary | ICD-10-CM

## 2022-04-20 DIAGNOSIS — R682 Dry mouth, unspecified: Secondary | ICD-10-CM | POA: Diagnosis not present

## 2022-04-20 DIAGNOSIS — L989 Disorder of the skin and subcutaneous tissue, unspecified: Secondary | ICD-10-CM | POA: Diagnosis not present

## 2022-04-20 DIAGNOSIS — Z23 Encounter for immunization: Secondary | ICD-10-CM

## 2022-04-20 DIAGNOSIS — R278 Other lack of coordination: Secondary | ICD-10-CM | POA: Diagnosis not present

## 2022-04-20 DIAGNOSIS — M6281 Muscle weakness (generalized): Secondary | ICD-10-CM

## 2022-04-20 LAB — CBC WITH DIFFERENTIAL/PLATELET
Basophils Absolute: 0.1 10*3/uL (ref 0.0–0.1)
Basophils Relative: 1 % (ref 0.0–3.0)
Eosinophils Absolute: 0.2 10*3/uL (ref 0.0–0.7)
Eosinophils Relative: 2.1 % (ref 0.0–5.0)
HCT: 38 % (ref 36.0–46.0)
Hemoglobin: 12.5 g/dL (ref 12.0–15.0)
Lymphocytes Relative: 29.7 % (ref 12.0–46.0)
Lymphs Abs: 2.3 10*3/uL (ref 0.7–4.0)
MCHC: 33 g/dL (ref 30.0–36.0)
MCV: 82.5 fl (ref 78.0–100.0)
Monocytes Absolute: 0.6 10*3/uL (ref 0.1–1.0)
Monocytes Relative: 8.2 % (ref 3.0–12.0)
Neutro Abs: 4.5 10*3/uL (ref 1.4–7.7)
Neutrophils Relative %: 59 % (ref 43.0–77.0)
Platelets: 355 10*3/uL (ref 150.0–400.0)
RBC: 4.6 Mil/uL (ref 3.87–5.11)
RDW: 14.2 % (ref 11.5–15.5)
WBC: 7.7 10*3/uL (ref 4.0–10.5)

## 2022-04-20 LAB — BASIC METABOLIC PANEL
BUN: 8 mg/dL (ref 6–23)
CO2: 29 mEq/L (ref 19–32)
Calcium: 9.6 mg/dL (ref 8.4–10.5)
Chloride: 99 mEq/L (ref 96–112)
Creatinine, Ser: 0.66 mg/dL (ref 0.40–1.20)
GFR: 92.82 mL/min (ref >60.00)
Glucose, Bld: 106 mg/dL — ABNORMAL HIGH (ref 70–99)
Potassium: 4.6 mEq/L (ref 3.5–5.1)
Sodium: 134 mEq/L — ABNORMAL LOW (ref 135–145)

## 2022-04-20 LAB — SEDIMENTATION RATE: Sed Rate: 11 mm/hr (ref 0–30)

## 2022-04-20 NOTE — Progress Notes (Unsigned)
Patient ID: Rachel Duran, female    DOB: May 19, 1956, 66 y.o.   MRN: [id number]  This visit was conducted in person.  BP 136/82   Pulse (!) 102   Temp 97.7 F (36.5 C) (Temporal)   Ht '5\' 3"'$  (1.6 m)   Wt 123 lb 6 oz (56 kg)   LMP 07/25/1998   SpO2 96%   BMI 21.85 kg/m    CC: check toe, mouth dryness  Subjective:   HPI: Signa Cheek is a 66 y.o. female presenting on 04/20/2022 for Toe Pain (C/o R great toe soreness. Seen previously and tx with abx. Still no improvement.    ) and Dry Mouth (C/o mouth dryness.  Started about 3 mos ago after surgery, but worsened in the last 2-3 wks. )   2 mo h/o R toe sore present to 1st IP joint. Seen 02/2022, treated with 7d doxycycline course. Dry sore remains present, hasn't fully closed. Painful to touch, painful with flexion of toe. Unsure how it started. No other skin lesions. She's tried OTC hydrocortisone as well as neosporin to skin.   Severe dry mouth worse than usual dry mouth due to meds. Ongoing since latest neck surgery 12/2021. Affecting lips as well - they can even peel.  No new medications.  Wakes up at night to drink water due to dry mouth. She has to drink liquids to swallow dry foods. No h/o swollen salivary glands.  Saw dentist yesterday - rec treat symptoms with spry mouth wash and xylimelts lozenges.  She feels parched all day - has increased water intake.  Occ dry eyes - wears contacts. No urinary symptoms      Relevant past medical, surgical, family and social history reviewed and updated as indicated. Interim medical history since our last visit reviewed. Allergies and medications reviewed and updated. Outpatient Medications Prior to Visit  Medication Sig Dispense Refill   ALPRAZolam (XANAX) 1 MG tablet Take 0.5 tablets (0.5 mg total) by mouth at bedtime. 30 tablet 0   amLODipine (NORVASC) 2.5 MG tablet Take 3 tablets (7.5 mg total) by mouth daily. 45 tablet 0   amLODipine (NORVASC) 5 MG tablet Take 7.5 mg  by mouth daily.     amphetamine-dextroamphetamine (ADDERALL XR) 30 MG 24 hr capsule TAKE 1 CAPSUEL BY MOUTH EACH MORNING 90 capsule 0   ARIPiprazole (ABILIFY) 5 MG tablet Take 5 mg by mouth at bedtime.     aspirin 81 MG chewable tablet Chew 1 tablet (81 mg total) by mouth daily. 30 tablet 0   Calcium Carbonate-Vitamin D (CALTRATE 600+D PO) Take 2 tablets by mouth daily.     clindamycin (CLEOCIN) 150 MG capsule Take '600mg'$  1 hour prior to dental procedure 10 capsule 0   cyanocobalamin (,VITAMIN B-12,) 1000 MCG/ML injection INJECT 1ML INTO THE MUSCLE EVERY 21 DAYSAS DIRECTED (Patient taking differently: Inject 1,000 mcg into the muscle See admin instructions. Every 21 days) 3 mL 3   Eszopiclone 3 MG TABS TAKE 1 TABLET BY MOUTH AT BEDTIME IMMEDIATLEY AS DIRECTED. 90 tablet 0   ferrous sulfate 325 (65 FE) MG tablet Take 1 tablet (325 mg total) by mouth every Monday, Wednesday, and Friday. 30 tablet 3   lamoTRIgine (LAMICTAL) 200 MG tablet Take 1 tablet (200 mg total) by mouth daily. 30 tablet 0   lidocaine (LIDODERM) 5 % Place 3 patches onto the skin daily. Remove & Discard patch within 12 hours or as directed by MD 60 patch 0   lidocaine (  XYLOCAINE) 5 % ointment Apply 1 Application topically as needed. Apply up to 4x/day- on LUE for nerve pain 50 g 5   linaclotide (LINZESS) 290 MCG CAPS capsule Take 1 capsule (290 mcg total) by mouth daily. 30 capsule 6   morphine (MSIR) 15 MG tablet Take 1 tablet (15 mg total) by mouth every 4 (four) hours as needed for moderate pain. 90 tablet 0   naloxegol oxalate (MOVANTIK) 12.5 MG TABS tablet Take 1 tablet (12.5 mg total) by mouth daily. 30 tablet 0   NONFORMULARY OR COMPOUNDED ITEM Testosterone proprionate petrolatum (jar) 2% ointment  2 Grams S:  apply a small amount once daily x 5 days per week. 2 each 0   ondansetron (ZOFRAN) 4 MG tablet Take 4 mg by mouth every 8 (eight) hours as needed for nausea or vomiting.     ondansetron (ZOFRAN-ODT) 4 MG disintegrating  tablet Take 4 mg by mouth every 6 (six) hours as needed for nausea or vomiting.     pantoprazole (PROTONIX) 40 MG tablet TAKE ONE TABLET TWICE A DAY WITH MEALS 180 tablet 0   pentosan polysulfate (ELMIRON) 100 MG capsule Take 200 mg by mouth 2 (two) times daily.      polyethylene glycol (MIRALAX / GLYCOLAX) 17 g packet Take 17 g by mouth 2 (two) times daily.     pregabalin (LYRICA) 150 MG capsule Take 1 capsule (150 mg total) by mouth in the morning, at noon, and at bedtime. 270 capsule 1   ursodiol (ACTIGALL) 300 MG capsule TAKE 1 CAPSULE BY MOUTH 2 TIMES DAILY. 180 capsule 0   cyclobenzaprine (FLEXERIL) 10 MG tablet Take 1 tablet (10 mg total) by mouth 3 (three) times daily as needed for muscle spasms. (Patient not taking: Reported on 04/04/2022) 60 tablet 0   gabapentin (NEURONTIN) 100 MG capsule Take 100 mg by mouth 3 (three) times daily.     No facility-administered medications prior to visit.     Per HPI unless specifically indicated in ROS section below Review of Systems  Objective:  BP 136/82   Pulse (!) 102   Temp 97.7 F (36.5 C) (Temporal)   Ht '5\' 3"'$  (1.6 m)   Wt 123 lb 6 oz (56 kg)   LMP 07/25/1998   SpO2 96%   BMI 21.85 kg/m   Wt Readings from Last 3 Encounters:  04/20/22 123 lb 6 oz (56 kg)  04/04/22 125 lb 12.8 oz (57.1 kg)  03/23/22 125 lb 2 oz (56.8 kg)      Physical Exam Vitals and nursing note reviewed.  Neck:     Comments: Possible enlarged R submandibular salivary gland      Results for orders placed or performed during the hospital encounter of 01/07/22  Comprehensive metabolic panel  Result Value Ref Range   Sodium 134 (L) 135 - 145 mmol/L   Potassium 4.2 3.5 - 5.1 mmol/L   Chloride 100 98 - 111 mmol/L   CO2 27 22 - 32 mmol/L   Glucose, Bld 102 (H) 70 - 99 mg/dL   BUN 7 (L) 8 - 23 mg/dL   Creatinine, Ser 0.49 0.44 - 1.00 mg/dL   Calcium 8.8 (L) 8.9 - 10.3 mg/dL   Total Protein 5.6 (L) 6.5 - 8.1 g/dL   Albumin 3.2 (L) 3.5 - 5.0 g/dL   AST 14  (L) 15 - 41 U/L   ALT 9 0 - 44 U/L   Alkaline Phosphatase 72 38 - 126 U/L   Total Bilirubin 0.5  0.3 - 1.2 mg/dL   GFR, Estimated >60 >60 mL/min   Anion gap 7 5 - 15  CBC with Differential/Platelet  Result Value Ref Range   WBC 7.8 4.0 - 10.5 K/uL   RBC 3.02 (L) 3.87 - 5.11 MIL/uL   Hemoglobin 8.8 (L) 12.0 - 15.0 g/dL   HCT 26.3 (L) 36.0 - 46.0 %   MCV 87.1 80.0 - 100.0 fL   MCH 29.1 26.0 - 34.0 pg   MCHC 33.5 30.0 - 36.0 g/dL   RDW 14.2 11.5 - 15.5 %   Platelets 361 150 - 400 K/uL   nRBC 0.0 0.0 - 0.2 %   Neutrophils Relative % 50 %   Neutro Abs 3.9 1.7 - 7.7 K/uL   Lymphocytes Relative 30 %   Lymphs Abs 2.4 0.7 - 4.0 K/uL   Monocytes Relative 8 %   Monocytes Absolute 0.6 0.1 - 1.0 K/uL   Eosinophils Relative 9 %   Eosinophils Absolute 0.7 (H) 0.0 - 0.5 K/uL   Basophils Relative 1 %   Basophils Absolute 0.1 0.0 - 0.1 K/uL   Immature Granulocytes 2 %   Abs Immature Granulocytes 0.17 (H) 0.00 - 0.07 K/uL  CBC with Differential/Platelet  Result Value Ref Range   WBC 6.7 4.0 - 10.5 K/uL   RBC 3.05 (L) 3.87 - 5.11 MIL/uL   Hemoglobin 8.6 (L) 12.0 - 15.0 g/dL   HCT 26.9 (L) 36.0 - 46.0 %   MCV 88.2 80.0 - 100.0 fL   MCH 28.2 26.0 - 34.0 pg   MCHC 32.0 30.0 - 36.0 g/dL   RDW 13.9 11.5 - 15.5 %   Platelets 336 150 - 400 K/uL   nRBC 0.0 0.0 - 0.2 %   Neutrophils Relative % 43 %   Neutro Abs 2.9 1.7 - 7.7 K/uL   Lymphocytes Relative 39 %   Lymphs Abs 2.6 0.7 - 4.0 K/uL   Monocytes Relative 7 %   Monocytes Absolute 0.5 0.1 - 1.0 K/uL   Eosinophils Relative 8 %   Eosinophils Absolute 0.6 (H) 0.0 - 0.5 K/uL   Basophils Relative 1 %   Basophils Absolute 0.1 0.0 - 0.1 K/uL   Immature Granulocytes 2 %   Abs Immature Granulocytes 0.11 (H) 0.00 - 0.07 K/uL  Basic metabolic panel  Result Value Ref Range   Sodium 134 (L) 135 - 145 mmol/L   Potassium 4.3 3.5 - 5.1 mmol/L   Chloride 100 98 - 111 mmol/L   CO2 27 22 - 32 mmol/L   Glucose, Bld 97 70 - 99 mg/dL   BUN <5 (L) 8 - 23  mg/dL   Creatinine, Ser 0.46 0.44 - 1.00 mg/dL   Calcium 8.7 (L) 8.9 - 10.3 mg/dL   GFR, Estimated >60 >60 mL/min   Anion gap 7 5 - 15   *Note: Due to a large number of results and/or encounters for the requested time period, some results have not been displayed. A complete set of results can be found in Results Review.    Assessment & Plan:   Problem List Items Addressed This Visit   None Visit Diagnoses     Need for influenza vaccination    -  Primary   Relevant Orders   Flu Vaccine QUAD 23moIM (Fluarix, Fluzone & Alfiuria Quad PF) (Completed)        No orders of the defined types were placed in this encounter.  Orders Placed This Encounter  Procedures   Flu Vaccine QUAD 652moM (  Fluarix, Fluzone & Alfiuria Quad PF)     ***Follow up plan: No follow-ups on file.  Ria Bush, MD

## 2022-04-20 NOTE — Therapy (Signed)
OUTPATIENT OCCUPATIONAL THERAPY NEURO TREATMENT  Patient Name: Rachel Duran MRN: [id number] DOB:[birth year], 66 y.o., female Today's Date: 04/20/2022  PCP: Ria Bush, MD  REFERRING PROVIDER: Courtney Heys, MD    OT End of Session - 04/20/22 1235     Visit Number 9    Number of Visits 17    Date for OT Re-Evaluation 05/05/22    Authorization Type [employer] MCR    OT Start Time 6546    OT Stop Time 1315    OT Time Calculation (min) 40 min    Activity Tolerance Patient tolerated treatment well    Behavior During Therapy WFL for tasks assessed/performed               Past Medical History:  Diagnosis Date   Abdominal pain last 4 months   and nausea also   Allergy    Anemia    history of   Anxiety    Bipolar disorder ([place])    atpical bipolar disorder   Cervical disc disease limited rom turning to left   hx. C6- C7 -hx. past fusion(bone graft used)   Cholecystitis    Chronic pain    DDD (degenerative disc disease), lumbar 09/2015   dextroscoliosis with multilevel DDD and facet arthrosis most notable for R foraminal disc protrusion L4/5 producing severe R neural foraminal stenosis abutting R L4 nerve root, moderate spinal canal and mild lat recesss and R neural foraminal stenosis L3/4 (MRI)   Depression    bipolar depression   Disorders of porphyrin metabolism    Felon of finger of left hand 11/10/2016   Fibromyalgia    GERD (gastroesophageal reflux disease)    Headache    occasionally   Internal hemorrhoids    Interstitial cystitis 06/06/2012   hx.   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 (Dr Baird Cancer retinologist)   Tuberculosis    Past Surgical History:  Procedure Laterality Date   ANTERIOR CERVICAL DECOMP/DISCECTOMY FUSION  2004   C5/6, C6/7   ANTERIOR CERVICAL DECOMP/DISCECTOMY FUSION  02/2016   C3/4, C4/5 with plating [name])   ANTERIOR CERVICAL DECOMP/DISCECTOMY FUSION  12/29/2021   Procedure: ANTERIOR CERVICAL DECOMPRESSION/ DISCECTOMY FUSION CERVICAL THREE - SEVEN;  Surgeon: Newman Pies, MD;  Location: [place];  Service: Neurosurgery;;   ANTERIOR CERVICAL DECOMPRESSION/DISCECTOMY FUSION 4 LEVELS N/A 12/29/2021   Procedure: CERVICAL FIVE-SIX CORPECTOMY,;  Surgeon: Newman Pies, MD;  Location: [place];  Service: Neurosurgery;  Laterality: N/A;   AUGMENTATION MAMMAPLASTY Bilateral 03/25/2010   BREAST ENHANCEMENT SURGERY  2010   BREAST IMPLANT EXCHANGE  10/2014   exchange saline implants, B mastopexy/capsulorraphy ([facility])   BUNIONECTOMY Bilateral yrs ago   [place]   x 1   CHOLECYSTECTOMY  06/11/2012   Procedure: LAPAROSCOPIC CHOLECYSTECTOMY WITH INTRAOPERATIVE CHOLANGIOGRAM;  Surgeon: [name], MD,FACS;  Location: [facility];  Service: General;  Laterality: N/A;   COLONOSCOPY  02/2018   done for positive cologuard - WNL, rpt 10 yrs Fuller Plan)  CYSTOSCOPY     ERCP  05/22/2012   Procedure: ENDOSCOPIC RETROGRADE CHOLANGIOPANCREATOGRAPHY (ERCP);  Surgeon: Malcolm T Stark, MD,FACG;  Location: [facility];  Service: Endoscopy;  Laterality: N/A;   ERCP N/A 09/17/2013   Procedure: ENDOSCOPIC RETROGRADE CHOLANGIOPANCREATOGRAPHY (ERCP);  Surgeon: Malcolm T Stark, MD;  Location: [facility];  Service: Endoscopy;  Laterality: N/A;   ERCP N/A 09/27/2018   Procedure: ENDOSCOPIC RETROGRADE CHOLANGIOPANCREATOGRAPHY (ERCP);  Surgeon: Stark, Malcolm T, MD;  Location: [facility];  Service: Endoscopy;  Laterality: N/A;   ESOPHAGOGASTRODUODENOSCOPY  09/2016   WNL. esophagus dilated  (Stark)   HEMORRHOID BANDING  09/23/2013   --Dr. Eric Wilson   HERNIA REPAIR     inguinal   HYSTEROSCOPY W/ ENDOMETRIAL ABLATION     KNEE ARTHROSCOPY Right 04/06/2021   Procedure: RIGHT KNEE ARTHROSCOPY WITH DEBRIDEMENT;  Surgeon: Dean, Gregory Scott, MD;  Location: [facility];  Service: Orthopedics;  Laterality: Right;   NASAL SINUS SURGERY     x5   PARTIAL KNEE ARTHROPLASTY Right 06/01/2020   Procedure: Right knee patellofemoral replacement;  Surgeon: Dean, Gregory Scott, MD;  Location: [facility];  Service: Orthopedics;  Laterality: Right;   POSTERIOR CERVICAL FUSION/FORAMINOTOMY N/A 12/29/2021   Procedure: POSTERIOR CERVICAL FUSION CERVICAL THREE-SEVEN;  Surgeon: Jenkins, Jeffrey, MD;  Location: [facility];  Service: Neurosurgery;  Laterality: N/A;   REMOVAL OF STONES  09/27/2018   Procedure: REMOVAL OF STONES;  Surgeon: Stark, Malcolm T, MD;  Location: [facility];  Service: Endoscopy;;   SPHINCTEROTOMY  09/27/2018   Procedure: SPHINCTEROTOMY;  Surgeon: Stark, Malcolm T, MD;  Location: [facility];  Service: Endoscopy;;   TONSILLECTOMY     removed as a child   TOTAL HIP ARTHROPLASTY Left 03/12/2019   Procedure: LEFT TOTAL HIP ARTHROPLASTY ANTERIOR APPROACH;  (Blackman, Christopher Y, MD)   UPPER GASTROINTESTINAL ENDOSCOPY     Patient Active Problem List   Diagnosis Date Noted   Fusion of spine, cervical region    Anemia    Primary hypertension    Cervical spine arthritis with nerve pain 12/29/2021   C6 cervical fracture (HCC) 12/29/2021   C5 cervical fracture (HCC) 12/21/2021   Advanced directives, counseling/discussion 09/23/2021   Swelling of right index finger 07/14/2021   Synovitis of right knee    Scoliosis 01/13/2021   Osteopenia 07/02/2020   Status post left knee surgery 06/01/2020   Pulmonary nodule 05/27/2020   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   Chronic patellofemoral pain of right knee 05/05/2020   Positive ANA (antinuclear  antibody) 06/05/2019   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   Unilateral primary osteoarthritis, left hip 01/28/2019   Pelvic pain 12/12/2018   Common bile duct (CBD) obstruction    Venous insufficiency of left lower extremity 07/04/2018   Dyslipidemia 09/22/2017   DDD (degenerative disc disease), cervical 04/03/2017   DDD (degenerative disc disease), lumbar 04/03/2017   Onychomycosis 03/21/2017   Dysphagia 10/18/2016   Encounter for chronic pain management 10/18/2016   Biliary stasis 09/26/2016   HNP (herniated nucleus pulposus), lumbar 09/23/2015   Iron deficiency 07/30/2015   Health maintenance examination 06/15/2015   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   Clavicle enlargement 12/13/2014   Prediabetes 12/13/2014   Plantar fasciitis, bilateral 09/11/2014   Medicare annual wellness visit, subsequent 06/10/2014   Abnormal thyroid function test 06/10/2014   Chronic pain syndrome 06/10/2014   CFS (chronic fatigue syndrome) 04/08/2014   Postmenopausal atrophic vaginitis 10/19/2012   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   Cervical disc   disorder with radiculopathy of cervical region 05/28/2010   APHTHOUS ULCERS 01/31/2008   Insomnia 11/09/2007   Constipation 10/11/2007   Allergic rhinitis 04/12/2007   MDD (major depressive disorder), recurrent episode, moderate ([place]) 03/05/2007   [name] syndrome 03/05/2007   GERD 03/05/2007   ROSACEA 03/05/2007   NEURALGIA 03/05/2007   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   Chronic interstitial cystitis 12/06/2006   Fibromyalgia 12/06/2006    ONSET DATE: 01/12/2022 (Referral date), surgery 12/29/21 and revision on 12/30/21  REFERRING DIAG:  M54.10 (ICD-10-CM) - Radiculopathy, site unspecified  M43.22 (ICD-10-CM) - Fusion of spine, cervical region    THERAPY DIAG:  Other lack of coordination  Muscle weakness (generalized)  Pain in left arm  Rationale for Evaluation and Treatment  Rehabilitation  SUBJECTIVE:   SUBJECTIVE STATEMENT:  I need a review of the exercises  Pt accompanied by: self  PERTINENT HISTORY:  Chronic pain, [name] syndrome, GERD, cervical radiculopathy, fibromyalgia, scleroderma, herniated lumbar disc L4/L5, DDD, Partial right knee arthroplasty, scoliosis, Lt THA, interstitial cystitis From rehab admit note:  "ACDF 2004 C5-6 and C6-7 and 2017 C3-4 and C4-5, left total hip arthroplasty 2020, scleroderma.  Presented 12/29/2021 with noted history of fall 10/29/2021 suffering a C5-6 fracture subluxation/cervical kyphosis with persistent neck pain and initial conservative care with failed medical management.  Underwent 3 stage anterior posterior fusion C5-6 corpectomy, posterior fusion C3-7, ACDF C3-7 12/29/2021.  Postop she developed left hand weakness consistent with a C8 radiculopathy as well as small amount of epidural fluid at C5-6.  Cervical CT demonstrated that the left T1 pedicle screw was a bit low and into the neural foramen.  Patient underwent revision of posterior cervical instrumentation 12/30/2021 per Dr. Newman Pies.  Placed in a cervical hard collar when out of bed."   PRECAUTIONS: Cervical and Fall-cervical hard collar when OOB, cleared for overhead reach and light strengthening(5 lbs or less) collar until 04/11/22  PAIN:  Are you having pain? Yes: NPRS scale: 6/10 Pain location: Lt elbow to hand mostly index and thumb Pain description: Lt tingling/numb and achy from elbow to hand, throbbing in Lt shoulder blade. Pt also reports intense pain w/ hot and cold Lt index and thumb Aggravating factors: dressing, exercise Relieving factors: lidocaine topical rub   PATIENT GOALS get Rt arm a little stronger, writing, and get Lt arm better and stronger  OBJECTIVE:   HAND DOMINANCE: Right   TODAY'S TREATMENT:  04/20/22   Reviewed cane and theraband HEP - pt demo each x 10 reps each side. Pt reports increased pain in Lt hand w/ holding theraband  therefore therapist adapted/added handles to ends of theraband. Pt reports increased comfort and greatly reduced pain with adaptations.   Neurosurgeon had reported pt can d/c cervical collar on 9//18/23, however there was some confusion re: if only during therapy or if she can completely d/c. Therapist encouraged pt to call MD for clarifications re: collar and if she can begin turning head. Therapist did recommend continuing to wear while driving or riding in car until she sees MD again.   HOME EXERCISE PROGRAM: 02/04/22 - yellow theraputty Access Code: [email or web address] 02/22/22: coordination HEP, upgraded to red putty HEP  04/05/22 cane exercises, red theraband   PATIENT EDUCATION: Education details: cane exercises, red theraband exercises Person educated: Patient Education method: Explanation, demonstration Education comprehension: verbalized understanding, returned demonstration, and verbal cues required   GOALS: Goals reviewed with patient? Yes  SHORT TERM GOALS: Target date: 03/05/22  Independent w/ HEP for bilateral hand  coordination and strength (more focus on LUE)  Baseline: Goal status: MET  2.  Pt to write short paragraph maintaining 90% or greater legibility Baseline:  Goal status: MET  3.  Pt to cut food mod I w/ AE prn Baseline:  Goal status: MET reports cutting up food independently this week  4.  Pt to perform bathing w/ direct supervision only using AE prn Baseline:  Goal status: MET  5.  Pt to make simple snack/sandwich w/ supervision Baseline:  Goal status: MET   LONG TERM GOALS: Target date: 05/05/22  Independent w/ updated BUE strengthening HEP  Baseline:  Goal status: MET  2.  Pt to improve coordination bilaterally as evidenced by performing 9 hole peg test in 23 sec or less Rt hand, and 27 sec or less Lt hand Baseline: Rt = 26.78 sec, Lt = 31.28 sec Goal status: INITIAL  3.  Pt to improve grip strength by 5 lbs Rt hand, by 10 lbs Lt hand Baseline: Rt = 46  lbs, Lt = 29 lbs Goal status: INITIAL  4.  Pt to return to simple stovetop cooking with supervision Baseline:  Goal status: MET  5.  Pt to perform light IADLS (laundry, making bed, washing dishes) at mod I level Baseline:  Goal status: MET   ASSESSMENT:  CLINICAL IMPRESSION: Pt is progressing towards goals, she has been cleared for overhead reaching and light strengthening less than 5 lbs. She demonstrates understanding of updated HEP.Therapist issued updated HEP, reviewed today.  PERFORMANCE DEFICITS in functional skills including ADLs, IADLs, coordination, dexterity, sensation, ROM, strength, pain, FMC, mobility, balance, body mechanics, endurance, decreased knowledge of precautions, decreased knowledge of use of DME, and UE functional use.   IMPAIRMENTS are limiting patient from ADLs, IADLs, leisure, and social participation.   COMORBIDITIES has co-morbidities such as scleroderma, Raynauds, scoliosis, OA  that affects occupational performance. Patient will benefit from skilled OT to address above impairments and improve overall function.  MODIFICATION OR ASSISTANCE TO COMPLETE EVALUATION: Min-Moderate modification of tasks or assist with assess necessary to complete an evaluation.  OT OCCUPATIONAL PROFILE AND HISTORY: Detailed assessment: Review of records and additional review of physical, cognitive, psychosocial history related to current functional performance.  CLINICAL DECISION MAKING: Moderate - several treatment options, min-mod task modification necessary  REHAB POTENTIAL: Good  EVALUATION COMPLEXITY: Moderate    PLAN: OT FREQUENCY: 2x/week  OT DURATION: 12 weeks (anticipate only 8-10 weeks needed)   PLANNED INTERVENTIONS: self care/ADL training, therapeutic exercise, therapeutic activity, neuromuscular re-education, passive range of motion, functional mobility training, splinting, paraffin, fluidotherapy, moist heat, cryotherapy, patient/family education, energy  conservation, coping strategies training, DME and/or AE instructions, and Re-evaluation  RECOMMENDED OTHER SERVICES: NONE  CONSULTED AND AGREED WITH PLAN OF CARE: Patient  PLAN FOR NEXT SESSION: Progress towards remaining goals, assess remaining goals - anticipate d/c by 05/02/22.    Hans Eden, OT 04/20/2022, 12:36 PM

## 2022-04-20 NOTE — Telephone Encounter (Signed)
ERx 

## 2022-04-20 NOTE — Patient Instructions (Addendum)
Flu shot today Labs today  -continue symptomatic care for now.  Try neosporin to affected toe daily. If not helpful, try callus pad to toe. If not better, check with rheumatologist about this as well.

## 2022-04-21 DIAGNOSIS — L989 Disorder of the skin and subcutaneous tissue, unspecified: Secondary | ICD-10-CM | POA: Insufficient documentation

## 2022-04-21 LAB — SJOGREN'S SYNDROME ANTIBODS(SSA + SSB)
SSA (Ro) (ENA) Antibody, IgG: <1 AI
SSB (La) (ENA) Antibody, IgG: <1 AI

## 2022-04-21 NOTE — Assessment & Plan Note (Addendum)
Chronic, severe onset after recent neck surgery.  Recent unrevealing dental evaluation.  She has regularly been wearing neck brace, ?compression causing salivary gland dysfunction - will see if better once she stops neck brace wear.  No new meds to suspect as causative.  She has known autoimmunity with scleroderma and raynaud's. ?developing sjogren type symptoms - check ESR, CBC, anti-ro and la.  If not better, recommend touch base with rheum.

## 2022-04-21 NOTE — Assessment & Plan Note (Signed)
Poorly healing area present for at least 2 months.  Has been treated as cellulitis with oral doxycycline course without benefit.  Suspect chronic ulcer, not consistent with developing corn as this doesn't seem to be a pressure point with her shoes.  Recommend stop hydrocortisone, start daily neosporin and cover in bandage. If no better with this, recommend try callus pad to area to offload possible pressure causing skin changes. If not improved, recommend check in with rheumatology ensure not raynaud/scleroderma related.

## 2022-04-25 ENCOUNTER — Encounter: Payer: Self-pay | Admitting: Occupational Therapy

## 2022-04-25 ENCOUNTER — Ambulatory Visit: Payer: Medicare Other | Admitting: Occupational Therapy

## 2022-04-25 ENCOUNTER — Ambulatory Visit: Payer: Medicare Other | Attending: Physical Medicine and Rehabilitation | Admitting: Physical Therapy

## 2022-04-25 ENCOUNTER — Encounter: Payer: Self-pay | Admitting: Physical Therapy

## 2022-04-25 DIAGNOSIS — M6281 Muscle weakness (generalized): Secondary | ICD-10-CM

## 2022-04-25 DIAGNOSIS — R278 Other lack of coordination: Secondary | ICD-10-CM

## 2022-04-25 DIAGNOSIS — M79602 Pain in left arm: Secondary | ICD-10-CM | POA: Diagnosis present

## 2022-04-25 DIAGNOSIS — R2689 Other abnormalities of gait and mobility: Secondary | ICD-10-CM | POA: Diagnosis present

## 2022-04-25 DIAGNOSIS — R2681 Unsteadiness on feet: Secondary | ICD-10-CM | POA: Diagnosis present

## 2022-04-25 NOTE — Therapy (Signed)
OUTPATIENT PHYSICAL THERAPY NEURO TREATMENT   Patient Name: Rachel Duran MRN: [id number] DOB:02-Jun-1956, 66 y.o., female Today's Date: 04/25/2022   PCP: Ria Bush, MD REFERRING PROVIDER: Courtney Heys, MD    PT End of Session - 04/25/22 1146     Visit Number 13    Number of Visits 23   18 + eval   Date for PT Re-Evaluation 05/27/22    Authorization Type [employer]ICARE    Progress Note Due on Visit 20    PT Start Time 1145    PT Stop Time 1228    PT Time Calculation (min) 43 min    Equipment Utilized During Treatment Gait belt;Cervical collar    Activity Tolerance Patient tolerated treatment well    Behavior During Therapy WFL for tasks assessed/performed                Past Medical History:  Diagnosis Date   Abdominal pain last 4 months   and nausea also   Allergy    Anemia    history of   Anxiety    Bipolar disorder ([place])    atpical bipolar disorder   Cervical disc disease limited rom turning to left   hx. C6- C7 -hx. past fusion(bone graft used)   Cholecystitis    Chronic pain    DDD (degenerative disc disease), lumbar 09/2015   dextroscoliosis with multilevel DDD and facet arthrosis most notable for R foraminal disc protrusion L4/5 producing severe R neural foraminal stenosis abutting R L4 nerve root, moderate spinal canal and mild lat recesss and R neural foraminal stenosis L3/4 (MRI)   Depression    bipolar depression   Disorders of porphyrin metabolism    Felon of finger of left hand 11/10/2016   Fibromyalgia    GERD (gastroesophageal reflux disease)    Headache    occasionally   Internal hemorrhoids    Interstitial cystitis 06/06/2012   hx.   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 (Dr Baird Cancer retinologist)   Tuberculosis    Past Surgical History:  Procedure Laterality Date   ANTERIOR CERVICAL DECOMP/DISCECTOMY FUSION  2004   C5/6, C6/7   ANTERIOR CERVICAL DECOMP/DISCECTOMY FUSION  02/2016   C3/4, C4/5 with plating [name])   ANTERIOR CERVICAL DECOMP/DISCECTOMY FUSION  12/29/2021   Procedure: ANTERIOR CERVICAL DECOMPRESSION/ DISCECTOMY FUSION CERVICAL THREE - SEVEN;  Surgeon: Newman Pies, MD;  Location: [place];  Service: Neurosurgery;;   ANTERIOR CERVICAL DECOMPRESSION/DISCECTOMY FUSION 4 LEVELS N/A 12/29/2021   Procedure: CERVICAL FIVE-SIX CORPECTOMY,;  Surgeon: Newman Pies, MD;  Location: [place];  Service: Neurosurgery;  Laterality: N/A;   AUGMENTATION MAMMAPLASTY Bilateral 03/25/2010   BREAST ENHANCEMENT SURGERY  2010   BREAST IMPLANT EXCHANGE  10/2014   exchange saline implants, B mastopexy/capsulorraphy ([facility])   BUNIONECTOMY Bilateral yrs ago   [place]   x 1   CHOLECYSTECTOMY  06/11/2012   Procedure: LAPAROSCOPIC CHOLECYSTECTOMY WITH INTRAOPERATIVE CHOLANGIOGRAM;  Surgeon: [name], MD,FACS;  Location: [facility];  Service: General;  Laterality: N/A;   COLONOSCOPY  02/2018   done for positive cologuard - WNL, rpt 10 yrs Fuller Plan)   CYSTOSCOPY     ERCP  05/22/2012   Procedure: ENDOSCOPIC RETROGRADE CHOLANGIOPANCREATOGRAPHY (ERCP);  Surgeon: [name], MD,FACG;  Location: [facility];  Service: Endoscopy;  Laterality: N/A;   ERCP N/A 09/17/2013   Procedure: ENDOSCOPIC RETROGRADE CHOLANGIOPANCREATOGRAPHY (ERCP);  Surgeon: [name], MD;  Location: [facility];  Service: Endoscopy;  Laterality: N/A;   ERCP N/A 09/27/2018   Procedure: ENDOSCOPIC RETROGRADE CHOLANGIOPANCREATOGRAPHY (ERCP);  Surgeon: [name], MD;   Location: [facility];  Service: Endoscopy;  Laterality: N/A;   ESOPHAGOGASTRODUODENOSCOPY  09/2016   WNL. esophagus dilated Fuller Plan)   HEMORRHOID BANDING  09/23/2013   --Dr. Greer Pickerel   HERNIA REPAIR     inguinal   HYSTEROSCOPY W/ ENDOMETRIAL ABLATION     KNEE ARTHROSCOPY Right 04/06/2021   Procedure: RIGHT KNEE ARTHROSCOPY WITH DEBRIDEMENT;  Surgeon: [name], MD;  Location: [place];  Service: Orthopedics;  Laterality: Right;   NASAL SINUS SURGERY     x5   PARTIAL KNEE ARTHROPLASTY Right 06/01/2020   Procedure: Right knee patellofemoral replacement;  Surgeon: [name], MD;  Location: [place];  Service: Orthopedics;  Laterality: Right;   POSTERIOR CERVICAL FUSION/FORAMINOTOMY N/A 12/29/2021   Procedure: POSTERIOR CERVICAL FUSION CERVICAL THREE-SEVEN;  Surgeon: Newman Pies, MD;  Location: [place];  Service: Neurosurgery;  Laterality: N/A;   REMOVAL OF STONES  09/27/2018   Procedure: REMOVAL OF STONES;  Surgeon: [name], MD;  Location: [facility];  Service: Endoscopy;;   SPHINCTEROTOMY  09/27/2018   Procedure: SPHINCTEROTOMY;  Surgeon: [name], MD;  Location[facility];  Service: Endoscopy;;   TONSILLECTOMY     removed as a child   TOTAL HIP ARTHROPLASTY Left 03/12/2019   Procedure: LEFT TOTAL HIP ARTHROPLASTY ANTERIOR APPROACH;  [name], [name], MD)   UPPER GASTROINTESTINAL ENDOSCOPY     Patient Active Problem List   Diagnosis Date Noted   Sore on toe 04/21/2022   Fusion of spine, cervical region    Anemia    Primary hypertension    Cervical spine arthritis with nerve pain 12/29/2021   C6 cervical fracture ([place]) 12/29/2021   C5 cervical fracture ([place]) 12/21/2021   Advanced directives, counseling/discussion 09/23/2021   Swelling of right index finger 07/14/2021   Synovitis of right knee    Scoliosis 01/13/2021   Osteopenia 07/02/2020   Status post left knee surgery 06/01/2020    Pulmonary nodule 05/27/2020   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   Chronic patellofemoral pain of right knee 05/05/2020   Positive ANA (antinuclear antibody) 06/05/2019   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   Unilateral primary osteoarthritis, left hip 01/28/2019   Pelvic pain 12/12/2018   Common bile duct (CBD) obstruction    Venous insufficiency of left lower extremity 07/04/2018   Dyslipidemia 09/22/2017   DDD (degenerative disc disease), cervical 04/03/2017   DDD (degenerative disc disease), lumbar 04/03/2017   Onychomycosis 03/21/2017   Dysphagia 10/18/2016   Encounter for chronic pain management 10/18/2016   Biliary stasis 09/26/2016   Dry mouth 06/06/2016   HNP (herniated nucleus pulposus), lumbar 09/23/2015   Iron deficiency 07/30/2015   Health maintenance examination 06/15/2015   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   Clavicle enlargement 12/13/2014   Prediabetes 12/13/2014   Plantar fasciitis, bilateral 09/11/2014   Medicare annual wellness visit,  subsequent 06/10/2014   Abnormal thyroid function test 06/10/2014   Chronic pain syndrome 06/10/2014   CFS (chronic fatigue syndrome) 04/08/2014   Postmenopausal atrophic vaginitis 10/19/2012   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   Cervical disc disorder with radiculopathy of cervical region 05/28/2010   APHTHOUS ULCERS 01/31/2008   Insomnia 11/09/2007   Constipation 10/11/2007   Allergic rhinitis 04/12/2007   MDD (major depressive disorder), recurrent episode, moderate ([place]) 03/05/2007   [name] syndrome 03/05/2007   GERD 03/05/2007   ROSACEA 03/05/2007   NEURALGIA 03/05/2007   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[place]) 12/06/2006   Chronic interstitial cystitis 12/06/2006   Fibromyalgia 12/06/2006    ONSET DATE: 01/12/2022 (referral)  REFERRING DIAG: M54.10 (ICD-10-CM) - Radiculopathy, site unspecified M43.22 (ICD-10-CM) - Fusion of spine, cervical region   THERAPY DIAG:  Other lack of  coordination  Muscle weakness (generalized)  Unsteadiness on feet  Other abnormalities of gait and mobility  Rationale for Evaluation and Treatment Rehabilitation   PERTINENT HISTORY: Chronic pain, [name] syndrome, GERD, cervical radiculopathy, fibromyalgia, hereditary macular degeneration (she has most trouble with peripheral vision), herniated lumbar disc L4/L5, DDD, Partial right knee arthroplasty, scoliosis  From rehab admit note:  "ACDF 2004 C5-6 and C6-7 and 2017 C3-4 and C4-5, left total hip arthroplasty 2020, scleroderma.  Presented 12/29/2021 with noted history of fall 10/29/2021 suffering a C5-6 fracture subluxation/cervical kyphosis with persistent neck pain and initial conservative care with failed medical management.  Underwent 3 stage anterior posterior fusion C5-6 corpectomy, posterior fusion C3-7, ACDF C3-7 12/29/2021.  Postop she developed left hand weakness consistent with a C8 radiculopathy as well as small amount of epidural fluid at C5-6.  Cervical CT demonstrated that the left T1 pedicle screw was a bit low and into the neural foramen.  Patient underwent revision of posterior cervical instrumentation 12/30/2021 per Dr. Newman Pies.  Placed in a cervical hard collar when out of bed."    PRECAUTIONS: Cervical and Fall-cervical hard collar when OOB -updated precautions scanned into other media 03/16/2022.   SUBJECTIVE: Pt is excited about collar being removed and inquires about how limited her ROM needs to be.                                                                                                                                                                                            PAIN:  Endorses same pain from prior session. Are you having pain? No and Yes: NPRS scale: 2/10 Pain location: left index and thumb and right knee Pain description: N/T w/ hypersensitivity in left thumb and forefinger Aggravating factors: touching cold/hot Relieving factors: walking  (for knee) Pt  states additional lyrica has not helped symptoms. TODAY'S TREATMENT:  -Standing supination and pronation w/ 1# dumbbell x12 - added to HEP, discussed alternatives to prevent compensation at shoulder using table in sitting. -Assessed FGA:  [facility] PT Assessment - 04/25/22 1200       Functional Gait  Assessment   Gait assessed  Yes    Gait Level Surface Walks 20 ft in less than 5.5 sec, no assistive devices, good speed, no evidence for imbalance, normal gait pattern, deviates no more than 6 in outside of the 12 in walkway width.    Change in Gait Speed Able to smoothly change walking speed without loss of balance or gait deviation. Deviate no more than 6 in outside of the 12 in walkway width.    Gait with Horizontal Head Turns Performs head turns with moderate changes in gait velocity, slows down, deviates 10-15 in outside 12 in walkway width but recovers, can continue to walk.   cannot score due to limited ROM precautions   Gait with Vertical Head Turns Performs task with severe disruption of gait (eg, staggers 15 in outside 12 in walkway width, loses balance, stops, reaches for wall).   cannot score due to limited ROM precautions   Gait and Pivot Turn Pivot turns safely within 3 sec and stops quickly with no loss of balance.    Step Over Obstacle Is able to step over one shoe box (4.5 in total height) without changing gait speed. No evidence of imbalance.    Gait with Narrow Base of Support Is able to ambulate for 10 steps heel to toe with no staggering.    Gait with Eyes Closed Walks 20 ft, uses assistive device, slower speed, mild gait deviations, deviates 6-10 in outside 12 in walkway width. Ambulates 20 ft in less than 9 sec but greater than 7 sec.    Ambulating Backwards Walks 20 ft, no assistive devices, good speed, no evidence for imbalance, normal gait    Steps Alternating feet, must use rail.    Total Score 21    FGA comment: medium fall risk            Discussed  current walking regimen and safety walking in community independently like taking cell phone, using cane for first time out if desired, and not wearing headphones or other hearing/vision disruptions due to ongoing limited cervical ROM and using other senses to compensate in busier enviornment.  Further discussed precautions as written.  Edu on safety using heat modality for pain management in shoulders when fatigued.  5xSTS no UE support:  11.27sec  BERG not reassessed due to performance on FGA and limitations with scoring last assessment.  STAIRS:  Level of Assistance: Modified independence  Stair Negotiation Technique: Alternating Pattern  Forwards with Single Rail on Left  Number of Stairs: 16   Height of Stairs: 6"  Comments: Pt is safe and functional for home environment.  Discussed safety in community if she encounters stairs w/o rail using husband or cane for assistance as needed.  Anticipated this will improve as neck mobility and thus visualization of feet improves. ] PATIENT EDUCATION: Education details: Continue HEP and walking program.  Pt is safe to start walking in community without supervision.  Instructions for safely using heat modality at home. Person educated: Patient Education method: Explanation Education comprehension: verbalized understanding  HOME EXERCISE PROGRAM: Access Code: [email or web address]  GOALS: Goals reviewed with patient? Yes  SHORT TERM GOALS: Target date: 02/18/2022  Pt will be independent with  strength and balance HEP with supervision from family as needed. Baseline:  Established, pt compliant, would benefit from progressions. Goal status: MET  2.  Pt will decrease 5xSTS to </=17 seconds w/o UE support in order to demonstrate decreased risk for falls and improved functional bilateral LE strength and power. Baseline: 20.81sec w/ hands on knees; 02/22/2022 13.37sec w/o UE support Goal status: MET  3.  Pt will demonstrate TUG of </=12 seconds in order to  decrease risk of falls and improve functional mobility using LRAD. Baseline: 14.20 sec w/ RW; 02/22/2022 w/o SPC 8.19sec Goal status: MET  4.  Pt will increase BERG balance score to 40/56 to demonstrate improved static balance. Baseline: 36/56; 02/22/2022 50/56 Goal status: MET  5.  Pt will ambulate >/= 1000' feet w/ LRAD on 6MWT to demonstrate improved functional endurance for home and community participation. Baseline: 900' w/ RW; 756' w/ SPC Goal status: IN PROGRESS  6.  Pt will report no pain at rest and during static standing activities to promote improved activity tolerance. Baseline: 3/10; 02/22/2022 R knee pain 3/10  Goal status:  NOT MET  LONG TERM GOALS: Target date: 03/18/2022 (8 weeks); 04/15/2022 (12 weeks)  Pt will report return to neighborhood walking 3 days per week to promote return to prior level of function. Baseline:  She is walking 5 days a week with husband in neighborhood during cooler hours of day. Goal status: MET  2.  Pt will decrease 5xSTS to </=13 seconds w/o UE support in order to demonstrate decreased risk for falls and improved functional bilateral LE strength and power. Baseline: 20.81sec w/ hands on knees; 02/22/2022 13.37sec w/o UE; 03/16/2022 14.09 sec w/o UE support; 04/25/2022 11.27 sec w/o UE Goal status: MET  3.  Pt will increase BERG balance score to >/=55/56 to demonstrate improved static balance. Baseline: 36/56; 02/22/2022 50/56; 03/16/2022 54/56 Goal status: NOT MET-pt would have met this goal if collar was able to be removed.  4.  Pt will ambulate >/= 1000 feet w/ LRAD on 6MWT to demonstrate improved functional endurance for home and community participation. Baseline:  900' w/ RW; 1075' no AD Goal status: MET  5.  Pt will ambulate >/=1000 feet on various indoor and outdoor surfaces with LRAD and modified independent level of assist to promote household and community access. Baseline: No distance formally;  calculated on eval.  03/22/2022 1075' no AD;  03/22/2022 16 stairs reciprocally using R rail on descent Goal status: MET  6.  Pt will negotiate 14 stairs w/o UE support and reciprocal steps up and down independently to promote improved safety and independence within home environment. Baseline:  Pt demonstrates hesitancy with descending stairs.; 04/25/2022 16 stairs w/ left rail reciprocally modI Goal status: NOT MET-pt is safe and functional for home environment w/ left rail  NEW GOALS: Goals reviewed with patient? Yes  SHORT TERM GOALS=LONG TERM GOALS: Target date: 05/27/2022  Pt will be independent with strength and stretching HEP for cervical ROM when cleared by MD. Baseline:  ROM remains limited Goal status: INITIAL  2.  Pt will improve FGA score to >/=25/30 in order to demonstrate improved balance and decreased fall risk. Baseline: 21/30 Goal status: INITIAL  3.  Cervical rotation AROM will improve to >/= 30 degrees left and right to improve safe navigation in unfamiliar environments and eventual return to driving as approved by MD. Baseline: Limited to close to neutral Goal status: INITIAL   ASSESSMENT:  CLINICAL IMPRESSION: Assessed remaining LTGs this session with pt  demonstrating good progress meeting 4 of 6 goals.  She still requires a rail to be safest and most efficient with stairs thus not meeting her goal, but this meets the functional requirements of her home environment and is likely to improved as she is better able to visualize her LE with improved cervical ROM.  BERG was not reassessed this visit due to pt performing well on FGA scoring 21/30 limited primarily by cervical precautions.  She is safe to continue her walking program in the neighborhood by herself as she ambulates independently without and AD and has progressed her activity tolerance well over the past weeks.  Her 5xSTS is WNL this session performed at 11.27 sec w/o UE support.  Her balance is much improved this visit and she would benefit from ongoing PT  to further address the neck when precautions are lifted now that she is out of her cervical collar.    OBJECTIVE IMPAIRMENTS Abnormal gait, decreased activity tolerance, decreased balance, decreased endurance, decreased mobility, difficulty walking, decreased strength, impaired UE functional use, and pain.   ACTIVITY LIMITATIONS carrying, lifting, bending, squatting, and stairs  PARTICIPATION LIMITATIONS: driving, community activity, and yard work  PERSONAL FACTORS Age, Fitness, Past/current experiences, Time since onset of injury/illness/exacerbation, and 1-2 comorbidities: chronic pain and DDD  are also affecting patient's functional outcome.   REHAB POTENTIAL: Good  CLINICAL DECISION MAKING: Evolving/moderate complexity  EVALUATION COMPLEXITY: Moderate  PLAN: PT FREQUENCY:  1x/wk for 12 weeks (Discussed pt progress with pt and limitations in husband's schedule due to upcoming school year and dropping to 1x/wk for remainder of visits-was previously 2x/wk for 6 wks followed by 1x/wk for 6 wks)  PT DURATION: 12 weeks (cert 14 weeks - 06/0/0459)  PLANNED INTERVENTIONS: Therapeutic exercises, Therapeutic activity, Neuromuscular re-education, Balance training, Gait training, Patient/Family education, Joint mobilization, Stair training, Vestibular training, DME instructions, Cryotherapy, Moist heat, Manual therapy, and Re-evaluation  PLAN FOR NEXT SESSION:  Measure cervical ROM-gently until precautions lifted, practice gentle chin tucks and light ROM close to neutral, trunk rotation, isometric cervical exercises (add to HEP), push/pull once restrictions lifted  Elease Etienne, PT, DPT Outpatient [facility] [address], [place], [place] [address] [phone] 04/25/22, 12:56 PM

## 2022-04-25 NOTE — Therapy (Signed)
OUTPATIENT OCCUPATIONAL THERAPY NEURO TREATMENT  Patient Name: Rachel Duran MRN: [id number] DOB:09-30-1955, 66 y.o., female Today's Date: 04/25/2022  PCP: Ria Bush, MD  REFERRING PROVIDER: Courtney Heys, MD    OT End of Session - 04/25/22 1105     Visit Number 10    Number of Visits 17    Date for OT Re-Evaluation 05/05/22    Authorization Type [employer] MCR    OT Start Time 1102    OT Stop Time 1145    OT Time Calculation (min) 43 min    Activity Tolerance Patient tolerated treatment well    Behavior During Therapy WFL for tasks assessed/performed               Past Medical History:  Diagnosis Date   Abdominal pain last 4 months   and nausea also   Allergy    Anemia    history of   Anxiety    Bipolar disorder ([place])    atpical bipolar disorder   Cervical disc disease limited rom turning to left   hx. C6- C7 -hx. past fusion(bone graft used)   Cholecystitis    Chronic pain    DDD (degenerative disc disease), lumbar 09/2015   dextroscoliosis with multilevel DDD and facet arthrosis most notable for R foraminal disc protrusion L4/5 producing severe R neural foraminal stenosis abutting R L4 nerve root, moderate spinal canal and mild lat recesss and R neural foraminal stenosis L3/4 (MRI)   Depression    bipolar depression   Disorders of porphyrin metabolism    Felon of finger of left hand 11/10/2016   Fibromyalgia    GERD (gastroesophageal reflux disease)    Headache    occasionally   Internal hemorrhoids    Interstitial cystitis 06/06/2012   hx.   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 (Dr Baird Cancer retinologist)   Tuberculosis    Past Surgical History:  Procedure Laterality Date   ANTERIOR CERVICAL DECOMP/DISCECTOMY FUSION  2004   C5/6, C6/7   ANTERIOR CERVICAL DECOMP/DISCECTOMY FUSION  02/2016   C3/4, C4/5 with plating [name])   ANTERIOR CERVICAL DECOMP/DISCECTOMY FUSION  12/29/2021   Procedure: ANTERIOR CERVICAL DECOMPRESSION/ DISCECTOMY FUSION CERVICAL THREE - SEVEN;  Surgeon: Newman Pies, MD;  Location: [place];  Service: Neurosurgery;;   ANTERIOR CERVICAL DECOMPRESSION/DISCECTOMY FUSION 4 LEVELS N/A 12/29/2021   Procedure: CERVICAL FIVE-SIX CORPECTOMY,;  Surgeon: Newman Pies, MD;  Location: [place];  Service: Neurosurgery;  Laterality: N/A;   AUGMENTATION MAMMAPLASTY Bilateral 03/25/2010   BREAST ENHANCEMENT SURGERY  2010   BREAST IMPLANT EXCHANGE  10/2014   exchange saline implants, B mastopexy/capsulorraphy ([place] [facility])   BUNIONECTOMY Bilateral yrs ago   [place]   x 1   CHOLECYSTECTOMY  06/11/2012   Procedure: LAPAROSCOPIC CHOLECYSTECTOMY WITH INTRAOPERATIVE CHOLANGIOGRAM;  Surgeon: [name], MD,FACS;  Location: [facility];  Service: General;  Laterality: N/A;   COLONOSCOPY  02/2018   done for positive cologuard - WNL, rpt 10 yrs Fuller Plan)  CYSTOSCOPY     ERCP  05/22/2012   Procedure: ENDOSCOPIC RETROGRADE CHOLANGIOPANCREATOGRAPHY (ERCP);  Surgeon: [name], MD,FACG;  Location: [facility];  Service: Endoscopy;  Laterality: N/A;   ERCP N/A 09/17/2013   Procedure: ENDOSCOPIC RETROGRADE CHOLANGIOPANCREATOGRAPHY (ERCP);  Surgeon: [name], MD;  Location: [facility];  Service: Endoscopy;  Laterality: N/A;   ERCP N/A 09/27/2018   Procedure: ENDOSCOPIC RETROGRADE CHOLANGIOPANCREATOGRAPHY (ERCP);  Surgeon: [name], MD;  Location: [facility];  Service: Endoscopy;  Laterality: N/A;   ESOPHAGOGASTRODUODENOSCOPY  09/2016   WNL. esophagus dilated  [facility] Plan)   HEMORRHOID BANDING  09/23/2013   --Dr. Greer Pickerel   HERNIA REPAIR     inguinal   HYSTEROSCOPY W/ ENDOMETRIAL ABLATION     KNEE ARTHROSCOPY Right 04/06/2021   Procedure: RIGHT KNEE ARTHROSCOPY WITH DEBRIDEMENT;  Surgeon: [name], MD;  Location: [place];  Service: Orthopedics;  Laterality: Right;   NASAL SINUS SURGERY     x5   PARTIAL KNEE ARTHROPLASTY Right 06/01/2020   Procedure: Right knee patellofemoral replacement;  Surgeon: [name], MD;  Location: [place];  Service: Orthopedics;  Laterality: Right;   POSTERIOR CERVICAL FUSION/FORAMINOTOMY N/A 12/29/2021   Procedure: POSTERIOR CERVICAL FUSION CERVICAL THREE-SEVEN;  Surgeon: Newman Pies, MD;  Location: [place];  Service: Neurosurgery;  Laterality: N/A;   REMOVAL OF STONES  09/27/2018   Procedure: REMOVAL OF STONES;  Surgeon: [name], MD;  [facility];  Service: Endoscopy;;   SPHINCTEROTOMY  09/27/2018   Procedure: SPHINCTEROTOMY;  Surgeon: [name], MD;  [facility];  Service: Endoscopy;;   TONSILLECTOMY     removed as a child   TOTAL HIP ARTHROPLASTY Left 03/12/2019   Procedure: LEFT TOTAL HIP ARTHROPLASTY ANTERIOR APPROACH;  [name], [name], MD)   UPPER GASTROINTESTINAL ENDOSCOPY     Patient Active Problem List   Diagnosis Date Noted   Sore on toe 04/21/2022   Fusion of spine, cervical region    Anemia    Primary hypertension    Cervical spine arthritis with nerve pain 12/29/2021   C6 cervical fracture ([place]) 12/29/2021   C5 cervical fracture ([place]) 12/21/2021   Advanced directives, counseling/discussion 09/23/2021   Swelling of right index finger 07/14/2021   Synovitis of right knee    Scoliosis 01/13/2021   Osteopenia 07/02/2020   Status post left knee surgery 06/01/2020   Pulmonary nodule 05/27/2020   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   Chronic patellofemoral pain of right knee 05/05/2020    Positive ANA (antinuclear antibody) 06/05/2019   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   Unilateral primary osteoarthritis, left hip 01/28/2019   Pelvic pain 12/12/2018   Common bile duct (CBD) obstruction    Venous insufficiency of left lower extremity 07/04/2018   Dyslipidemia 09/22/2017   DDD (degenerative disc disease), cervical 04/03/2017   DDD (degenerative disc disease), lumbar 04/03/2017   Onychomycosis 03/21/2017   Dysphagia 10/18/2016   Encounter for chronic pain management 10/18/2016   Biliary stasis 09/26/2016   Dry mouth 06/06/2016   HNP (herniated nucleus pulposus), lumbar 09/23/2015   Iron deficiency 07/30/2015   Health maintenance examination 06/15/2015   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   Clavicle enlargement 12/13/2014   Prediabetes 12/13/2014   Plantar fasciitis, bilateral 09/11/2014   Medicare annual wellness visit, subsequent 06/10/2014   Abnormal thyroid function test 06/10/2014   Chronic pain syndrome 06/10/2014   CFS (chronic fatigue syndrome) 04/08/2014   Postmenopausal atrophic vaginitis 10/19/2012  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   Cervical disc disorder with radiculopathy of cervical region 05/28/2010   APHTHOUS ULCERS 01/31/2008   Insomnia 11/09/2007   Constipation 10/11/2007   Allergic rhinitis 04/12/2007   MDD (major depressive disorder), recurrent episode, moderate ([place]) 03/05/2007   [name] syndrome 03/05/2007   GERD 03/05/2007   ROSACEA 03/05/2007   NEURALGIA 03/05/2007   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  Rationale for Evaluation and Treatment Rehabilitation  SUBJECTIVE:    SUBJECTIVE STATEMENT:    I have a little pain since removing the collar, but nothing intense Pt accompanied by: self  PERTINENT HISTORY:  Chronic pain, [name] syndrome, GERD, cervical radiculopathy, fibromyalgia, scleroderma, herniated lumbar disc L4/L5, DDD, Partial right knee arthroplasty, scoliosis, Lt THA, interstitial cystitis From rehab admit note:  "ACDF 2004 C5-6 and C6-7 and 2017 C3-4 and C4-5, left total hip arthroplasty 2020, scleroderma.  Presented 12/29/2021 with noted history of fall 10/29/2021 suffering a C5-6 fracture subluxation/cervical kyphosis with persistent neck pain and initial conservative care with failed medical management.  Underwent 3 stage anterior posterior fusion C5-6 corpectomy, posterior fusion C3-7, ACDF C3-7 12/29/2021.  Postop she developed left hand weakness consistent with a C8 radiculopathy as well as small amount of epidural fluid at C5-6.  Cervical CT demonstrated that the left T1 pedicle screw was a bit low and into the neural foramen.  Patient underwent revision of posterior cervical instrumentation 12/30/2021 per Dr. Newman Pies.  Placed in a cervical hard collar when out of bed."   PRECAUTIONS: Cervical and Fall-cervical hard collar when OOB, cleared for overhead reach and light strengthening(5 lbs or less) collar until 04/11/22  PAIN:  Are you having pain? Yes NPRS scale: 2/10 Pain location: upper traps, T1 of spine Pain orientation: Right and Left  PAIN TYPE: tender and sore Pain description: intermittent  Aggravating factors: collar off, laying on it Relieving factors: nothing    PATIENT GOALS get Rt arm a little stronger, writing, and get Lt arm better and stronger  OBJECTIVE:   HAND DOMINANCE: Right   TODAY'S TREATMENT:  04/25/22 - Pt can remove collar at all times now, no big head turns - see updated info scanned in Epic  Pt reports HEP is going much better and handles on theraband has really helped - did not need to  review  Reviewed/assessed remaining goals - see below. Pt improved in both bilateral grip strength and coordination.   Pt placing grooved pegs into pegboard using tweezers Lt hand w/ mod to max difficulty. Resorted to placing in with first two digits d/t some discomfort and difficulty. However, pt able to remove pegs using tweezers.   UBE x 7 mintues, level 3 for UE strength/endurance     HOME EXERCISE PROGRAM: 02/04/22 - yellow theraputty Access Code: [email or web address] 02/22/22: coordination HEP, upgraded to red putty HEP  04/05/22 cane exercises, red theraband   PATIENT EDUCATION: Education details: cane exercises, red theraband exercises Person educated: Patient Education method: Explanation, demonstration Education comprehension: verbalized understanding, returned demonstration, and verbal cues required   GOALS: Goals reviewed with patient? Yes  SHORT TERM GOALS: Target date: 03/05/22  Independent w/ HEP for bilateral hand coordination and strength (more focus on LUE)  Baseline: Goal status: MET  2.  Pt to write short paragraph maintaining 90% or greater legibility Baseline:  Goal status: MET  3.  Pt to cut food mod I w/ AE prn Baseline:  Goal status: MET reports cutting up food independently this week  4.  Pt to perform bathing w/ direct supervision only using AE prn Baseline:  Goal status: MET  5.  Pt to make simple snack/sandwich w/ supervision Baseline:  Goal status: MET   LONG TERM GOALS: Target date: 05/05/22  Independent w/ updated BUE strengthening HEP  Baseline:  Goal status: MET  2.  Pt to improve coordination bilaterally as evidenced by performing 9 hole peg test in 23 sec or less Rt hand, and 27 sec or less Lt hand Baseline: Rt = 26.78 sec, Lt = 31.28 sec Goal status: MET (Rt = 22.50 sec, Lt = 24.19 sec)   3.  Pt to improve grip strength by 5 lbs Rt hand, by 10 lbs Lt hand Baseline: Rt = 46 lbs, Lt = 29 lbs Goal status: MET (Rt = 58.6 lbs, Lt = 44.5 lbs)    4.  Pt to return to simple stovetop cooking with supervision Baseline:  Goal status: MET  5.  Pt to perform light IADLS (laundry, making bed, washing dishes) at mod I level Baseline:  Goal status: MET   ASSESSMENT:  CLINICAL IMPRESSION: Pt has met all O.T. goals at this time and agrees to d/c  PERFORMANCE DEFICITS in functional skills including ADLs, IADLs, coordination, dexterity, sensation, ROM, strength, pain, FMC, mobility, balance, body mechanics, endurance, decreased knowledge of precautions, decreased knowledge of use of DME, and UE functional use.   IMPAIRMENTS are limiting patient from ADLs, IADLs, leisure, and social participation.   COMORBIDITIES has co-morbidities such as scleroderma, Raynauds, scoliosis, OA  that affects occupational performance. Patient will benefit from skilled OT to address above impairments and improve overall function.  MODIFICATION OR ASSISTANCE TO COMPLETE EVALUATION: Min-Moderate modification of tasks or assist with assess necessary to complete an evaluation.  OT OCCUPATIONAL PROFILE AND HISTORY: Detailed assessment: Review of records and additional review of physical, cognitive, psychosocial history related to current functional performance.  CLINICAL DECISION MAKING: Moderate - several treatment options, min-mod task modification necessary  REHAB POTENTIAL: Good  EVALUATION COMPLEXITY: Moderate    PLAN: OT FREQUENCY: 2x/week  OT DURATION: 12 weeks (anticipate only 8-10 weeks needed)   PLANNED INTERVENTIONS: self care/ADL training, therapeutic exercise, therapeutic activity, neuromuscular re-education, passive range of motion, functional mobility training, splinting, paraffin, fluidotherapy, moist heat, cryotherapy, patient/family education, energy conservation, coping strategies training, DME and/or AE instructions, and Re-evaluation  RECOMMENDED OTHER SERVICES: NONE  CONSULTED AND AGREED WITH PLAN OF CARE: Patient  PLAN :  d/c  O.T.    OCCUPATIONAL THERAPY DISCHARGE SUMMARY  Visits from Start of Care: 10  Current functional level related to goals / functional outcomes: See above - pt has met all goals   Remaining deficits: Strength  Mild coordination Lt hand Numbness thumb and index finger Lt hand   Education / Equipment: HEP's   Patient agrees to discharge. Patient goals were met. Patient is being discharged due to meeting the stated rehab goals.Hans Eden, OT 04/25/2022, 11:05 AM

## 2022-04-25 NOTE — Patient Instructions (Signed)
Access Code: [email or web address] URL: [email or web address] Date: 04/25/2022 Prepared by: Elease Etienne  Exercises - Side Stepping with Resistance at Thighs and Counter Support  - 1 x daily - 5 x weekly - 2 sets - 10 reps - Tandem Walking with Counter Support  - 1 x daily - 4-5 x weekly - 3 sets - 10 reps - Standing Shoulder Flexion to 90 Degrees with Dumbbells  - 1 x daily - 4-5 x weekly - 2 sets - 10-12 reps - 1-2 seconds hold - Shoulder Abduction with Dumbbells - Thumbs Up  - 1 x daily - 4-5 x weekly - 2 sets - 10-12 reps - 1-2 seconds hold - Supine Scapular Protraction in Flexion with Dumbbells  - 1 x daily - 5 x weekly - 3 sets - 10-12 reps - Standing Lat Pull Down with Resistance - Elbows Bent  - 1 x daily - 5 x weekly - 2-3 sets - 12-15 reps - Single Arm Shoulder Extension with Anchored Resistance  - 1 x daily - 4-5 x weekly - 2-3 sets - 10-15 reps - Forearm Supination with Dumbbell  - 1 x daily - 4 x weekly - 2 sets - 12 reps - Forearm Pronation with Dumbbell  - 1 x daily - 4 x weekly - 2 sets - 12 reps

## 2022-04-29 ENCOUNTER — Other Ambulatory Visit: Payer: Self-pay | Admitting: Family Medicine

## 2022-04-29 NOTE — Telephone Encounter (Signed)
Last office visit 04/20/22 for Toe Pain, Dry Mouth.  Last refilled 03/23/2022 for #90 with no refills.  Next Appt: 06/27/22

## 2022-04-30 NOTE — Telephone Encounter (Signed)
ERx 

## 2022-05-02 ENCOUNTER — Ambulatory Visit: Payer: Medicare Other | Admitting: Physical Therapy

## 2022-05-02 ENCOUNTER — Ambulatory Visit: Payer: Medicare Other | Admitting: Occupational Therapy

## 2022-05-02 ENCOUNTER — Encounter: Payer: Self-pay | Admitting: Physical Therapy

## 2022-05-02 DIAGNOSIS — R278 Other lack of coordination: Secondary | ICD-10-CM | POA: Diagnosis not present

## 2022-05-02 DIAGNOSIS — R2689 Other abnormalities of gait and mobility: Secondary | ICD-10-CM

## 2022-05-02 DIAGNOSIS — M6281 Muscle weakness (generalized): Secondary | ICD-10-CM

## 2022-05-02 DIAGNOSIS — R2681 Unsteadiness on feet: Secondary | ICD-10-CM

## 2022-05-02 NOTE — Patient Instructions (Signed)
Access Code: [email or web address] URL: [email or web address] Date: 05/02/2022 Prepared by: Elease Etienne  Exercises - Tandem Walking with Counter Support  - 1 x daily - 4-5 x weekly - 3 sets - 10 reps - Shoulder Abduction with Dumbbells - Thumbs Up  - 1 x daily - 4-5 x weekly - 2 sets - 10-12 reps - 1-2 seconds hold - Supine Scapular Protraction in Flexion with Dumbbells  - 1 x daily - 5 x weekly - 3 sets - 10-12 reps - Standing Lat Pull Down with Resistance - Elbows Bent  - 1 x daily - 5 x weekly - 2-3 sets - 12-15 reps - Single Arm Shoulder Extension with Anchored Resistance  - 1 x daily - 4-5 x weekly - 2-3 sets - 10-15 reps - Forearm Supination with Dumbbell  - 1 x daily - 4 x weekly - 2 sets - 12 reps - Forearm Pronation with Dumbbell  - 1 x daily - 4 x weekly - 2 sets - 12 reps - Seated Isometric Cervical Rotation  - 1 x daily - 4-5 x weekly - 2 sets - 5 reps - 3-4 seconds hold - Standing Isometric Cervical Flexion with Manual Resistance  - 1 x daily - 4-5 x weekly - 2 sets - 5 reps - 3-4 seconds hold - Standing Isometric Cervical Sidebending with Manual Resistance  - 1 x daily - 4 x weekly - 1 sets - 5 reps - 2 seconds hold - Isometric Cervical Extension at Wall with Ball  - 1 x daily - 4-5 x weekly - 2 sets - 5 reps - 2-3 seconds hold

## 2022-05-02 NOTE — Therapy (Signed)
OUTPATIENT PHYSICAL THERAPY NEURO TREATMENT   Patient Name: Rachel Duran MRN: [id number] DOB:[birth year], 66 y.o., female Today's Date: 05/02/2022   PCP: Ria Bush, MD REFERRING PROVIDER: Courtney Heys, MD    PT End of Session - 05/02/22 0850     Visit Number 14    Number of Visits 23   18 + eval   Date for PT Re-Evaluation 05/27/22    Authorization Type [employer]ICARE    Progress Note Due on Visit 20    PT Start Time 0847    PT Stop Time 0931    PT Time Calculation (min) 44 min    Equipment Utilized During Treatment Gait belt;Cervical collar    Activity Tolerance Patient tolerated treatment well    Behavior During Therapy WFL for tasks assessed/performed                Past Medical History:  Diagnosis Date   Abdominal pain last 4 months   and nausea also   Allergy    Anemia    history of   Anxiety    Bipolar disorder ([place])    atpical bipolar disorder   Cervical disc disease limited rom turning to left   hx. C6- C7 -hx. past fusion(bone graft used)   Cholecystitis    Chronic pain    DDD (degenerative disc disease), lumbar 09/2015   dextroscoliosis with multilevel DDD and facet arthrosis most notable for R foraminal disc protrusion L4/5 producing severe R neural foraminal stenosis abutting R L4 nerve root, moderate spinal canal and mild lat recesss and R neural foraminal stenosis L3/4 (MRI)   Depression    bipolar depression   Disorders of porphyrin metabolism    Felon of finger of left hand 11/10/2016   Fibromyalgia    GERD (gastroesophageal reflux disease)    Headache    occasionally   Internal hemorrhoids    Interstitial cystitis 06/06/2012   hx.   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 (Dr Baird Cancer retinologist)   Tuberculosis    Past Surgical History:  Procedure Laterality Date   ANTERIOR CERVICAL DECOMP/DISCECTOMY FUSION  2004   C5/6, C6/7   ANTERIOR CERVICAL DECOMP/DISCECTOMY FUSION  02/2016   C3/4, C4/5 with plating [name])   ANTERIOR CERVICAL DECOMP/DISCECTOMY FUSION  12/29/2021   Procedure: ANTERIOR CERVICAL DECOMPRESSION/ DISCECTOMY FUSION CERVICAL THREE - SEVEN;  Surgeon: Newman Pies, MD;  Location: [place];  Service: Neurosurgery;;   ANTERIOR CERVICAL DECOMPRESSION/DISCECTOMY FUSION 4 LEVELS N/A 12/29/2021   Procedure: CERVICAL FIVE-SIX CORPECTOMY,;  Surgeon: Newman Pies, MD;  Location: [place];  Service: Neurosurgery;  Laterality: N/A;   AUGMENTATION MAMMAPLASTY Bilateral 03/25/2010   BREAST ENHANCEMENT SURGERY  2010   BREAST IMPLANT EXCHANGE  10/2014   exchange saline implants, B mastopexy/capsulorraphy ([facility])   BUNIONECTOMY Bilateral yrs ago   [place]   x 1   CHOLECYSTECTOMY  06/11/2012   Procedure: LAPAROSCOPIC CHOLECYSTECTOMY WITH INTRAOPERATIVE CHOLANGIOGRAM;  Surgeon: [name], MD,FACS;  Location: [facility];  Service: General;  Laterality: N/A;   COLONOSCOPY  02/2018   done for positive cologuard - WNL, rpt 10 yrs Fuller Plan)   CYSTOSCOPY     ERCP  05/22/2012   Procedure: ENDOSCOPIC RETROGRADE CHOLANGIOPANCREATOGRAPHY (ERCP);  Surgeon: [name], MD,FACG;  Location: [facility];  Service: Endoscopy;  Laterality: N/A;   ERCP N/A 09/17/2013   Procedure: ENDOSCOPIC RETROGRADE CHOLANGIOPANCREATOGRAPHY (ERCP);  Surgeon: [name], MD;  Location: [facility];  Service: Endoscopy;  Laterality: N/A;   ERCP N/A 09/27/2018   Procedure: ENDOSCOPIC RETROGRADE CHOLANGIOPANCREATOGRAPHY (ERCP);  Surgeon: [name], MD;   Location: [facility];  Service: Endoscopy;  Laterality: N/A;   ESOPHAGOGASTRODUODENOSCOPY  09/2016   WNL. esophagus dilated Fuller Plan)   HEMORRHOID BANDING  09/23/2013   --Dr. Greer Pickerel   HERNIA REPAIR     inguinal   HYSTEROSCOPY W/ ENDOMETRIAL ABLATION     KNEE ARTHROSCOPY Right 04/06/2021   Procedure: RIGHT KNEE ARTHROSCOPY WITH DEBRIDEMENT;  Surgeon: [name], MD;  Location: [place];  Service: Orthopedics;  Laterality: Right;   NASAL SINUS SURGERY     x5   PARTIAL KNEE ARTHROPLASTY Right 06/01/2020   Procedure: Right knee patellofemoral replacement;  Surgeon: [name], MD;  Location: [place];  Service: Orthopedics;  Laterality: Right;   POSTERIOR CERVICAL FUSION/FORAMINOTOMY N/A 12/29/2021   Procedure: POSTERIOR CERVICAL FUSION CERVICAL THREE-SEVEN;  Surgeon: Newman Pies, MD;  Location: [place];  Service: Neurosurgery;  Laterality: N/A;   REMOVAL OF STONES  09/27/2018   Procedure: REMOVAL OF STONES;  Surgeon: [name], MD;  Location: [facility];  Service: Endoscopy;;   SPHINCTEROTOMY  09/27/2018   Procedure: SPHINCTEROTOMY;  Surgeon: [name], MD;  Location: [facility];  Service: Endoscopy;;   TONSILLECTOMY     removed as a child   TOTAL HIP ARTHROPLASTY Left 03/12/2019   Procedure: LEFT TOTAL HIP ARTHROPLASTY ANTERIOR APPROACH;  [name], [name], MD)   UPPER GASTROINTESTINAL ENDOSCOPY     Patient Active Problem List   Diagnosis Date Noted   Sore on toe 04/21/2022   Fusion of spine, cervical region    Anemia    Primary hypertension    Cervical spine arthritis with nerve pain 12/29/2021   C6 cervical fracture ([place]) 12/29/2021   C5 cervical fracture ([place]) 12/21/2021   Advanced directives, counseling/discussion 09/23/2021   Swelling of right index finger 07/14/2021   Synovitis of right knee    Scoliosis 01/13/2021   Osteopenia 07/02/2020   Status post left knee surgery 06/01/2020    Pulmonary nodule 05/27/2020   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   Chronic patellofemoral pain of right knee 05/05/2020   Positive ANA (antinuclear antibody) 06/05/2019   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   Unilateral primary osteoarthritis, left hip 01/28/2019   Pelvic pain 12/12/2018   Common bile duct (CBD) obstruction    Venous insufficiency of left lower extremity 07/04/2018   Dyslipidemia 09/22/2017   DDD (degenerative disc disease), cervical 04/03/2017   DDD (degenerative disc disease), lumbar 04/03/2017   Onychomycosis 03/21/2017   Dysphagia 10/18/2016   Encounter for chronic pain management 10/18/2016   Biliary stasis 09/26/2016   Dry mouth 06/06/2016   HNP (herniated nucleus pulposus), lumbar 09/23/2015   Iron deficiency 07/30/2015   Health maintenance examination 06/15/2015   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   Clavicle enlargement 12/13/2014   Prediabetes 12/13/2014   Plantar fasciitis, bilateral 09/11/2014   Medicare annual wellness visit,  subsequent 06/10/2014   Abnormal thyroid function test 06/10/2014   Chronic pain syndrome 06/10/2014   CFS (chronic fatigue syndrome) 04/08/2014   Postmenopausal atrophic vaginitis 10/19/2012   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   Cervical disc disorder with radiculopathy of cervical region 05/28/2010   APHTHOUS ULCERS 01/31/2008   Insomnia 11/09/2007   Constipation 10/11/2007   Allergic rhinitis 04/12/2007   MDD (major depressive disorder), recurrent episode, moderate ([place]) 03/05/2007   [name] syndrome 03/05/2007   GERD 03/05/2007   ROSACEA 03/05/2007   NEURALGIA 03/05/2007   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[place]) 12/06/2006   Chronic interstitial cystitis 12/06/2006   Fibromyalgia 12/06/2006    ONSET DATE: 01/12/2022 (referral)  REFERRING DIAG: M54.10 (ICD-10-CM) - Radiculopathy, site unspecified M43.22 (ICD-10-CM) - Fusion of spine, cervical region   THERAPY DIAG:  Other lack of  coordination  Muscle weakness (generalized)  Unsteadiness on feet  Other abnormalities of gait and mobility  Rationale for Evaluation and Treatment Rehabilitation   PERTINENT HISTORY: Chronic pain, [name] syndrome, GERD, cervical radiculopathy, fibromyalgia, hereditary macular degeneration (she has most trouble with peripheral vision), herniated lumbar disc L4/L5, DDD, Partial right knee arthroplasty, scoliosis  From rehab admit note:  "ACDF 2004 C5-6 and C6-7 and 2017 C3-4 and C4-5, left total hip arthroplasty 2020, scleroderma.  Presented 12/29/2021 with noted history of fall 10/29/2021 suffering a C5-6 fracture subluxation/cervical kyphosis with persistent neck pain and initial conservative care with failed medical management.  Underwent 3 stage anterior posterior fusion C5-6 corpectomy, posterior fusion C3-7, ACDF C3-7 12/29/2021.  Postop she developed left hand weakness consistent with a C8 radiculopathy as well as small amount of epidural fluid at C5-6.  Cervical CT demonstrated that the left T1 pedicle screw was a bit low and into the neural foramen.  Patient underwent revision of posterior cervical instrumentation 12/30/2021 per Dr. Newman Pies.  Placed in a cervical hard collar when out of bed."    PRECAUTIONS: Cervical and Fall-updated precautions scanned into other media 03/16/2022.   SUBJECTIVE: Pt took a walk alone this week without her cane and states it went well.  She feels her right leg is coming down too quickly when she is stepping off of things and stepping up.                                                                                                                                                                                            PAIN:   Are you having pain? Yes: NPRS scale: 3/10 Pain location: left index and thumb and right knee Pain description: N/T w/ hypersensitivity in left thumb and forefinger Aggravating factors: touching cold/hot,  mornings Relieving  factors: walking (for knee)  TODAY'S TREATMENT:  PT types and prints letter to Dr. [name] regarding remaining precautions questions per pt request to present at appt next week.  Measured cervical ROM to pt comfort only (no stressing end range, edu to pt to continue keeping neck close to neutral with everyday motions as able using trunk rotation to compensate as needed): -Right rotation 29 deg -Left rotation 18 deg -Cervical extension 8 deg -Cervical flexion 5 deg  Extensive questions deferred to surgeon regarding lifetime activity limitations, recovery of ROM post-surgically, remaining precautions, and expectations for return to driving.  General knowledge provided based on current precautions and hardware limitations post-operatively in regards to ROM.  Cervical isometrics (added to HEP): -cervical rotation 2x5 w/ 4 second hold each side -cervical flexion 2x5 w/ 4 second hold -cervical side-bending 1x5 w/ 2 second hold -cervical extension 2x5 w/ 3 second hold Pt has no adverse symptoms or radiation into UE during exercises.  PATIENT EDUCATION: Education details: Additions to HEP and walking program.   Person educated: Patient Education method: Explanation Education comprehension: verbalized understanding  HOME EXERCISE PROGRAM: Access Code: [email or web address]  GOALS: Goals reviewed with patient? Yes  SHORT TERM GOALS: Target date: 02/18/2022  Pt will be independent with strength and balance HEP with supervision from family as needed. Baseline:  Established, pt compliant, would benefit from progressions. Goal status: MET  2.  Pt will decrease 5xSTS to </=17 seconds w/o UE support in order to demonstrate decreased risk for falls and improved functional bilateral LE strength and power. Baseline: 20.81sec w/ hands on knees; 02/22/2022 13.37sec w/o UE support Goal status: MET  3.  Pt will demonstrate TUG of </=12 seconds in order to decrease risk of falls and improve functional mobility  using LRAD. Baseline: 14.20 sec w/ RW; 02/22/2022 w/o SPC 8.19sec Goal status: MET  4.  Pt will increase BERG balance score to 40/56 to demonstrate improved static balance. Baseline: 36/56; 02/22/2022 50/56 Goal status: MET  5.  Pt will ambulate >/= 1000' feet w/ LRAD on 6MWT to demonstrate improved functional endurance for home and community participation. Baseline: 900' w/ RW; 756' w/ SPC Goal status: IN PROGRESS  6.  Pt will report no pain at rest and during static standing activities to promote improved activity tolerance. Baseline: 3/10; 02/22/2022 R knee pain 3/10  Goal status:  NOT MET  LONG TERM GOALS: Target date: 03/18/2022 (8 weeks); 04/15/2022 (12 weeks)  Pt will report return to neighborhood walking 3 days per week to promote return to prior level of function. Baseline:  She is walking 5 days a week with husband in neighborhood during cooler hours of day. Goal status: MET  2.  Pt will decrease 5xSTS to </=13 seconds w/o UE support in order to demonstrate decreased risk for falls and improved functional bilateral LE strength and power. Baseline: 20.81sec w/ hands on knees; 02/22/2022 13.37sec w/o UE; 03/16/2022 14.09 sec w/o UE support; 04/25/2022 11.27 sec w/o UE Goal status: MET  3.  Pt will increase BERG balance score to >/=55/56 to demonstrate improved static balance. Baseline: 36/56; 02/22/2022 50/56; 03/16/2022 54/56 Goal status: NOT MET-pt would have met this goal if collar was able to be removed.  4.  Pt will ambulate >/= 1000 feet w/ LRAD on 6MWT to demonstrate improved functional endurance for home and community participation. Baseline:  900' w/ RW; 1075' no AD Goal status: MET  5.  Pt will ambulate >/=1000 feet on various indoor and outdoor surfaces  with LRAD and modified independent level of assist to promote household and community access. Baseline: No distance formally;  calculated on eval.  03/22/2022 1075' no AD; 03/22/2022 16 stairs reciprocally using R rail on  descent Goal status: MET  6.  Pt will negotiate 14 stairs w/o UE support and reciprocal steps up and down independently to promote improved safety and independence within home environment. Baseline:  Pt demonstrates hesitancy with descending stairs.; 04/25/2022 16 stairs w/ left rail reciprocally modI Goal status: NOT MET-pt is safe and functional for home environment w/ left rail  NEW GOALS: Goals reviewed with patient? Yes  SHORT TERM GOALS=LONG TERM GOALS: Target date: 05/27/2022  Pt will be independent with strength and stretching HEP for cervical ROM when cleared by MD. Baseline:  ROM remains limited Goal status: INITIAL  2.  Pt will improve FGA score to >/=25/30 in order to demonstrate improved balance and decreased fall risk. Baseline: 21/30 Goal status: INITIAL  3.  Cervical rotation AROM will improve to >/= 30 degrees left and right to improve safe navigation in unfamiliar environments and eventual return to driving as approved by MD. Baseline: Limited to close to neutral Goal status: INITIAL   ASSESSMENT:  CLINICAL IMPRESSION: Initiated cervical isometrics this session to promote improved strength and comfortable support without the neck brace.  Measured gentle AROM this session to assess pt baseline with instruction to remain close to neutral in everyday activities per precautions.  Pt is most limited in cervical side-bending.  PT provides note to pt to obtain precaution clarifications at next surgical follow-up and general edu on prognosis and recovery of ROM and postural correction.  Will continue to address deficits more dynamically in future sessions as precautions are lifted.  OBJECTIVE IMPAIRMENTS Abnormal gait, decreased activity tolerance, decreased balance, decreased endurance, decreased mobility, difficulty walking, decreased strength, impaired UE functional use, and pain.   ACTIVITY LIMITATIONS carrying, lifting, bending, squatting, and stairs  PARTICIPATION  LIMITATIONS: driving, community activity, and yard work  PERSONAL FACTORS Age, Fitness, Past/current experiences, Time since onset of injury/illness/exacerbation, and 1-2 comorbidities: chronic pain and DDD  are also affecting patient's functional outcome.   REHAB POTENTIAL: Good  CLINICAL DECISION MAKING: Evolving/moderate complexity  EVALUATION COMPLEXITY: Moderate  PLAN: PT FREQUENCY:  1x/wk for 12 weeks (Discussed pt progress with pt and limitations in husband's schedule due to upcoming school year and dropping to 1x/wk for remainder of visits-was previously 2x/wk for 6 wks followed by 1x/wk for 6 wks)  PT DURATION: 12 weeks (cert 14 weeks - 16/07/958)  PLANNED INTERVENTIONS: Therapeutic exercises, Therapeutic activity, Neuromuscular re-education, Balance training, Gait training, Patient/Family education, Joint mobilization, Stair training, Vestibular training, DME instructions, Cryotherapy, Moist heat, Manual therapy, and Re-evaluation  PLAN FOR NEXT SESSION:  practice gentle chin tucks and light ROM close to neutral, trunk rotation, resisted walking/lateral stepping, right step downs/ups-add to HEP, lifting from floor when restrictions lifted, push/pull once restrictions lifted  Elease Etienne, PT, DPT [facility] [address] [place], [place] [address] [phone] 05/02/22, 10:00 AM

## 2022-05-03 ENCOUNTER — Encounter: Payer: Self-pay | Admitting: Rheumatology

## 2022-05-03 ENCOUNTER — Encounter: Payer: Self-pay | Admitting: Obstetrics and Gynecology

## 2022-05-03 ENCOUNTER — Ambulatory Visit (INDEPENDENT_AMBULATORY_CARE_PROVIDER_SITE_OTHER): Payer: Medicare Other | Admitting: Obstetrics and Gynecology

## 2022-05-03 VITALS — BP 140/82 | HR 90 | Resp 12 | Ht 63.0 in | Wt 122.0 lb

## 2022-05-03 DIAGNOSIS — M85851 Other specified disorders of bone density and structure, right thigh: Secondary | ICD-10-CM

## 2022-05-03 DIAGNOSIS — Z8781 Personal history of (healed) traumatic fracture: Secondary | ICD-10-CM

## 2022-05-03 DIAGNOSIS — R6882 Decreased libido: Secondary | ICD-10-CM

## 2022-05-03 DIAGNOSIS — N952 Postmenopausal atrophic vaginitis: Secondary | ICD-10-CM | POA: Diagnosis not present

## 2022-05-03 DIAGNOSIS — Z5181 Encounter for therapeutic drug level monitoring: Secondary | ICD-10-CM

## 2022-05-03 MED ORDER — INTRAROSA 6.5 MG VA INST: VAGINAL_INSERT | VAGINAL | 0 refills | Status: DC

## 2022-05-03 NOTE — Progress Notes (Signed)
66 y.o. G51P2002 [name] here for an office visit.  She is followed for decreased libido, vaginal dryness, and osteopenia. She is using testosterone and Intrarosa.  [place] and broke her neck.  Had surgery.  Just stopped using her cervical collar.  She is doing PT. She has nerve pain in her left hand. She is using Lidocaine locally and is taking Lyrica through her rheumatologist.   Unable to tolerate a mammogram due to difficulty turning her neck.  Has used the testosterone sporadically in the last 6 months.  Starting using the testosterone 5 days a week, starting 2 weeks ago.   She wants to continue this and the Intrarosa.   PCP:   Ria Bush, MD   Patient's last menstrual period was 07/25/1998.           Sexually active: Yes.    The current method of family planning is post menopausal status.    Exercising: Yes.     Physical and occupational therapy, walking Smoker:  no  Health Maintenance: Pap:  04-29-21 negative, HR HPV negative            01-31-2019 negative           01-13-17 negative History of abnormal Pap:  no MMG:  01-08-21 density C/BIRADS 1 negative, will schedule once cleared for full range of motion of neck  Colonoscopy:  02-22-18 normal, 10 year f/u BMD:   10-06-21  Result  osteopenia  TDaP:  11-10-16 Gardasil:   no HIV: unsure Hep C: 07-16-15 negative  Screening Labs:  Hb today: PCP, Urine today: not collected  Flu vaccine:  completed.  Covid vaccine:  she will plan to do this.    reports that she has never smoked. She has never been exposed to tobacco smoke. She has never used smokeless tobacco. She reports that she does not drink alcohol and does not use drugs.  Past Medical History:  Diagnosis Date   Abdominal pain last 4 months   and nausea also   Allergy    Anemia    history of   Anxiety    Bipolar disorder (HCC)    atpical bipolar disorder   Cervical disc disease limited rom turning to left   hx. C6- C7 -hx. past fusion(bone  graft used)   Cholecystitis    Chronic pain    DDD (degenerative disc disease), lumbar 09/2015   dextroscoliosis with multilevel DDD and facet arthrosis most notable for R foraminal disc protrusion L4/5 producing severe R neural foraminal stenosis abutting R L4 nerve root, moderate spinal canal and mild lat recesss and R neural foraminal stenosis L3/4 (MRI)   Depression    bipolar depression   Disorders of porphyrin metabolism    Felon of finger of left hand 11/10/2016   Fibromyalgia    GERD (gastroesophageal reflux disease)    Headache    occasionally   Internal hemorrhoids    Interstitial cystitis 06/06/2012   hx.   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 (Dr Baird Cancer retinologist)   Tuberculosis     Past Surgical History:  Procedure Laterality Date   ANTERIOR CERVICAL DECOMP/DISCECTOMY FUSION  2004   C5/6, C6/7   ANTERIOR CERVICAL DECOMP/DISCECTOMY FUSION  02/2016   C3/4, C4/5 with plating [name])   ANTERIOR CERVICAL DECOMP/DISCECTOMY FUSION  12/29/2021   Procedure: ANTERIOR CERVICAL DECOMPRESSION/ DISCECTOMY FUSION CERVICAL THREE - SEVEN;  Surgeon: Newman Pies, MD;  Location: [place];  Service: Neurosurgery;;   ANTERIOR CERVICAL DECOMPRESSION/DISCECTOMY FUSION 4 LEVELS N/A 12/29/2021   Procedure: CERVICAL FIVE-SIX CORPECTOMY,;  Surgeon: Newman Pies, MD;  Location: [place];  Service: Neurosurgery;  Laterality: N/A;   AUGMENTATION MAMMAPLASTY Bilateral 03/25/2010   BREAST ENHANCEMENT SURGERY  2010   BREAST IMPLANT EXCHANGE  10/2014    exchange saline implants, B mastopexy/capsulorraphy ([facility])   BUNIONECTOMY Bilateral yrs ago   [place]   x 1   CHOLECYSTECTOMY  06/11/2012   Procedure: LAPAROSCOPIC CHOLECYSTECTOMY WITH INTRAOPERATIVE CHOLANGIOGRAM;  Surgeon: [name], MD,FACS;  Location: [facility];  Service: General;  Laterality: N/A;   COLONOSCOPY  02/2018   done for positive cologuard - WNL, rpt 10 yrs Fuller Plan)   CYSTOSCOPY     ERCP  05/22/2012   Procedure: ENDOSCOPIC RETROGRADE CHOLANGIOPANCREATOGRAPHY (ERCP);  Surgeon: [name], MD,FACG;  Location: [facility];  Service: Endoscopy;  Laterality: N/A;   ERCP N/A 09/17/2013   Procedure: ENDOSCOPIC RETROGRADE CHOLANGIOPANCREATOGRAPHY (ERCP);  Surgeon: [name], MD;  Location: [facility];  Service: Endoscopy;  Laterality: N/A;   ERCP N/A 09/27/2018   Procedure: ENDOSCOPIC RETROGRADE CHOLANGIOPANCREATOGRAPHY (ERCP);  Surgeon: [name], MD;  Location: [facility];  Service: Endoscopy;  Laterality: N/A;   ESOPHAGOGASTRODUODENOSCOPY  09/2016   WNL. esophagus dilated Fuller Plan)   HEMORRHOID BANDING  09/23/2013   --Dr. Greer Pickerel   HERNIA REPAIR     inguinal   HYSTEROSCOPY W/ ENDOMETRIAL ABLATION     KNEE ARTHROSCOPY Right 04/06/2021   Procedure: RIGHT KNEE ARTHROSCOPY WITH DEBRIDEMENT;  Surgeon: [name], MD;  Location: [place];  Service: Orthopedics;  Laterality: Right;   NASAL SINUS SURGERY     x5   PARTIAL KNEE ARTHROPLASTY Right 06/01/2020   Procedure: Right knee patellofemoral replacement;  Surgeon: [name], MD;  Location: [place];  Service: Orthopedics;  Laterality: Right;   POSTERIOR CERVICAL FUSION/FORAMINOTOMY N/A 12/29/2021   Procedure: POSTERIOR CERVICAL FUSION CERVICAL THREE-SEVEN;  Surgeon: Newman Pies, MD;  Location: [place];  Service: Neurosurgery;  Laterality: N/A;   REMOVAL OF STONES  09/27/2018   Procedure: REMOVAL OF STONES;  Surgeon: [name], MD;  Location: [facility];  Service: Endoscopy;;   SPHINCTEROTOMY  09/27/2018   Procedure: SPHINCTEROTOMY;  Surgeon: [name], MD;  Location: [facility];  Service: Endoscopy;;   TONSILLECTOMY     removed as a child   TOTAL HIP ARTHROPLASTY Left 03/12/2019   Procedure: LEFT TOTAL HIP ARTHROPLASTY ANTERIOR APPROACH;  [name], [name], MD)   UPPER GASTROINTESTINAL ENDOSCOPY      Current Outpatient Medications  Medication Sig Dispense Refill   ALPRAZolam (XANAX) 1 MG tablet Take 0.5 tablets (0.5 mg total) by mouth at bedtime. 30 tablet 0   amLODipine (NORVASC) 2.5 MG tablet Take 3 tablets (7.5 mg total) by mouth daily. 45 tablet 0   amLODipine (NORVASC) 5 MG tablet Take 7.5 mg by mouth daily.     amphetamine-dextroamphetamine (ADDERALL XR) 30 MG 24 hr capsule TAKE 1 CAPSUEL  BY MOUTH EACH MORNING 90 capsule 0   ARIPiprazole (ABILIFY) 5 MG tablet Take 5 mg by mouth at bedtime.     aspirin 81 MG chewable tablet Chew 1 tablet (81 mg total) by mouth daily. 30 tablet 0   Calcium Carbonate-Vitamin D (CALTRATE 600+D PO) Take 2 tablets by mouth daily.     clindamycin (CLEOCIN) 150 MG capsule Take '600mg'$  1 hour prior to dental procedure 10 capsule 0   cyanocobalamin (,VITAMIN B-12,) 1000 MCG/ML injection INJECT 1ML INTO THE MUSCLE EVERY 21 DAYSAS DIRECTED (Patient taking differently: Inject 1,000 mcg into the muscle See admin instructions. Every 21 days) 3 mL 3   Eszopiclone 3 MG TABS TAKE 1 TABLET BY MOUTH AT BEDTIME IMMEDIATLEY AS DIRECTED. 90 tablet 0   ferrous sulfate 325 (65 FE) MG tablet Take 1 tablet (325 mg total) by mouth every Monday, Wednesday, and Friday. 30 tablet 3   lamoTRIgine (LAMICTAL) 200 MG tablet Take 1 tablet (200 mg total) by mouth daily. 30 tablet 0   lidocaine (LIDODERM) 5 % Place 3 patches onto the skin daily. Remove & Discard patch within 12 hours or as directed by MD 60 patch 0   lidocaine (XYLOCAINE) 5 % ointment Apply 1 Application topically as  needed. Apply up to 4x/day- on LUE for nerve pain 50 g 5   linaclotide (LINZESS) 290 MCG CAPS capsule Take 1 capsule (290 mcg total) by mouth daily. 30 capsule 6   morphine (MSIR) 15 MG tablet TAKE 1 TABLET BY MOUTH EVERY 4 HOURS AS NEEDED FOR MODERATE PAIN 90 tablet 0   naloxegol oxalate (MOVANTIK) 12.5 MG TABS tablet Take 1 tablet (12.5 mg total) by mouth daily. 30 tablet 0   NONFORMULARY OR COMPOUNDED ITEM Testosterone proprionate petrolatum (jar) 2% ointment  2 Grams S:  apply a small amount once daily x 5 days per week. 2 each 0   ondansetron (ZOFRAN) 4 MG tablet Take 4 mg by mouth every 8 (eight) hours as needed for nausea or vomiting.     ondansetron (ZOFRAN-ODT) 4 MG disintegrating tablet Take 4 mg by mouth every 6 (six) hours as needed for nausea or vomiting.     pantoprazole (PROTONIX) 40 MG tablet TAKE ONE TABLET TWICE A DAY WITH MEALS 180 tablet 0   pentosan polysulfate (ELMIRON) 100 MG capsule Take 200 mg by mouth 2 (two) times daily.      polyethylene glycol (MIRALAX / GLYCOLAX) 17 g packet Take 17 g by mouth 2 (two) times daily.     pregabalin (LYRICA) 150 MG capsule Take 1 capsule (150 mg total) by mouth in the morning, at noon, and at bedtime. 270 capsule 1   ursodiol (ACTIGALL) 300 MG capsule TAKE 1 CAPSULE BY MOUTH 2 TIMES DAILY. 180 capsule 0   No current facility-administered medications for this visit.    Family History  Problem Relation Age of Onset   CAD Father 50       MI, nonsmoker   Esophageal cancer Father 57   Stomach cancer Father    Scleroderma Mother    Hypertension Mother    Esophageal cancer Paternal Grandfather    Stomach cancer Paternal Grandfather    Diabetes Maternal Grandmother    Arthritis Brother    Stroke Neg Hx    Colon cancer Neg Hx    Rectal cancer Neg Hx     Review of Systems  All other systems reviewed and are negative.   Exam:   BP (!) 140/82   Pulse  90   Resp 12   Ht '5\' 3"'$  (1.6 m)   Wt 122 lb (55.3 kg)   LMP 07/25/1998    BMI 21.61 kg/m     General appearance: alert, cooperative and appears stated age Head: normocephalic, without obvious abnormality, atraumatic Neck: no adenopathy, supple, symmetrical, trachea midline and thyroid normal to inspection and palpation Lungs: clear to auscultation bilaterally Breasts: bilateral breast augmentation, no masses or tenderness, No nipple retraction or dimpling, No nipple discharge or bleeding, No axillary adenopathy Heart: regular rate and rhythm Abdomen: soft, non-tender; no masses, no organomegaly Extremities: extremities normal, atraumatic, no cyanosis or edema Skin: skin color, texture, turgor normal. No rashes or lesions Lymph nodes: cervical, supraclavicular, and axillary nodes normal. Neurologic: grossly normal  Pelvic: External genitalia:  no lesions              No abnormal inguinal nodes palpated.              Urethra:  normal appearing urethra with no masses, tenderness or lesions              Bartholins and Skenes: normal                 Vagina: normal appearing vagina with normal color and discharge, no lesions              Cervix: no lesions              Pap taken: no Bimanual Exam:  Uterus:  normal size, contour, position, consistency, mobility, non-tender              Adnexa: no mass, fullness, tenderness              Rectal exam: yes.  Confirms.              Anus:  normal sphincter tone, no lesions  Chaperone was present for exam:  Raquel Sarna, RN  Assessment:    Bilateral breast augmentation. Hx endometrial ablation.  Vaginal atrophy.  Decreased libido.  Osteopenia.  Hx cervical fracture.  Medication monitoring.  Stargardt's disease.  Progressive loss of vision.  Scleroderma.   Plan: Mammogram screening discussed.  She will schedule before end of the year.  Self breast awareness reviewed. Guidelines for Calcium, Vitamin D, regular exercise program including cardiovascular and weight bearing exercise. Has enough testosterone currently  until end of the year.  Will not measure a testosterone level as she is been back on this for a short period of time.  Refill of Intrarosa 90 day prescription.   After her mammogram is updated and normal, can refill testosterone and intrarosa to complete one year.  BMD in March.  Breast and pelvic exam and follow up in one year.   After visit summary provided.   35 min  total time was spent for this patient encounter, including preparation, face-to-face counseling with the patient, coordination of care, and documentation of the encounter.

## 2022-05-04 ENCOUNTER — Ambulatory Visit (INDEPENDENT_AMBULATORY_CARE_PROVIDER_SITE_OTHER): Payer: Medicare Other | Admitting: Gastroenterology

## 2022-05-04 ENCOUNTER — Encounter: Payer: Self-pay | Admitting: Gastroenterology

## 2022-05-04 VITALS — BP 126/80 | HR 105 | Ht 62.8 in | Wt 123.1 lb

## 2022-05-04 DIAGNOSIS — K5903 Drug induced constipation: Secondary | ICD-10-CM

## 2022-05-04 DIAGNOSIS — K219 Gastro-esophageal reflux disease without esophagitis: Secondary | ICD-10-CM

## 2022-05-04 DIAGNOSIS — K831 Obstruction of bile duct: Secondary | ICD-10-CM

## 2022-05-04 NOTE — Patient Instructions (Signed)
Continue current medications.   Follow up with Dr. Stark in one year.  The [facility] providers would like to encourage you to use MYCHART to communicate with providers for non-urgent requests or questions.  Due to long hold times on the telephone, sending your provider a message by MYCHART may be a faster and more efficient way to get a response.  Please allow 48 business hours for a response.  Please remember that this is for non-urgent requests.   Thank you for choosing me and  Gastroenterology.  Malcolm T. Stark, Jr., MD., FACG  

## 2022-05-04 NOTE — Progress Notes (Signed)
    Assessment     GERD Biliary stasis, S/P ERCP with sphincterotomy S/P cholecystectomy Constipation IBS CRC screening, average risk   Recommendations    Continue pantoprazole 40 mg p.o. twice daily and follow antireflux measures Continue ursodiol 300 mg p.o. twice daily Continue Linzess 290 mcg daily, Movantik 12.5 mg daily and MiraLAX bid as needed 10-year interval colonoscopy recommended in August 2029 REV in 1 year   HPI    This is a 66 year old female returning for follow-up of GERD, biliary stasis, IBS and constipation.  All gastrointestinal problems are stable and well-controlled on her current regimen.  LFTs from June 2023 were unremarkable.  She underwent cervical spine fusion in June 1517 with complications and a prolonged recovery.   Labs / Imaging       Latest Ref Rng & Units 01/10/2022    6:15 AM 10/29/2021    9:08 AM 10/27/2021    9:22 AM  Hepatic Function  Total Protein 6.5 - 8.1 g/dL 5.6  6.4  6.6   Albumin 3.5 - 5.0 g/dL 3.2  3.9    AST 15 - 41 U/L 14  38  21   ALT 0 - 44 U/L _0 Alk Phosphatase 38 - 126 U/L 72  82    Total Bilirubin 0.3 - 1.2 mg/dL 0.5  0.9  0.5        Latest Ref Rng & Units 04/20/2022   10:17 AM 01/13/2022    5:46 AM 01/10/2022    6:15 AM  CBC  WBC 4.0 - 10.5 K/uL 7.7  6.7  7.8   Hemoglobin 12.0 - 15.0 g/dL 12.5  8.6  8.8   Hematocrit 36.0 - 46.0 % 38.0  26.9  26.3   Platelets 150.0 - 400.0 K/uL 355.0  336  361    fe Current Medications, Allergies, Past Medical History, Past Surgical History, Family History and Social History were reviewed in [employer] record.   Physical Exam: General: Well developed, well nourished, no acute distress Head: Normocephalic and atraumatic Eyes: Sclerae anicteric, EOMI Ears: Normal auditory acuity Mouth: Not examined Lungs: Clear throughout to auscultation Heart: Regular rate and rhythm; no murmurs, rubs or bruits Abdomen: Soft, non tender and non distended. No  masses, hepatosplenomegaly or hernias noted. Normal Bowel sounds Rectal: Musculoskeletal: Symmetrical with no gross deformities  Pulses:  Normal pulses noted Extremities: No clubbing, cyanosis, edema or deformities noted Neurological: Alert oriented x 4, grossly nonfocal Psychological:  Alert and cooperative. Normal mood and affect   Rachel Duran T. [place] Plan, MD 05/04/2022, 11:03 AM

## 2022-05-05 ENCOUNTER — Other Ambulatory Visit: Payer: Self-pay | Admitting: Physician Assistant

## 2022-05-05 NOTE — Telephone Encounter (Signed)
Next Visit: 09/06/2022  Last Visit: 04/04/2022  Last Fill: 01/24/2022  Dx:  Limited systemic sclerosis   Current Dose per office note on 04/04/2022: amlodipine 7.5 mg daily   Okay to refill Amlodipine?

## 2022-05-09 ENCOUNTER — Other Ambulatory Visit: Payer: Self-pay | Admitting: Gastroenterology

## 2022-05-09 ENCOUNTER — Ambulatory Visit: Payer: Medicare Other | Admitting: Physical Therapy

## 2022-05-09 ENCOUNTER — Encounter: Payer: Self-pay | Admitting: Rheumatology

## 2022-05-09 MED ORDER — AMLODIPINE BESYLATE 2.5 MG PO TABS: 2.5000 mg | ORAL_TABLET | Freq: Every day | ORAL | 0 refills | Status: DC

## 2022-05-09 MED ORDER — AMLODIPINE BESYLATE 5 MG PO TABS: 5.0000 mg | ORAL_TABLET | Freq: Every day | ORAL | 0 refills | Status: DC

## 2022-05-09 NOTE — Telephone Encounter (Signed)
Please see message below from patient. Please review the pended prescriptions for amlodipine '5mg'$  and 2.'5mg'$  tablets. Thanks!

## 2022-05-10 ENCOUNTER — Encounter: Payer: Self-pay | Admitting: Rheumatology

## 2022-05-10 ENCOUNTER — Telehealth: Payer: Self-pay | Admitting: *Deleted

## 2022-05-10 NOTE — Telephone Encounter (Signed)
Submitted a Prior Authorization request to [facility] for  Voltaren Gel  via CoverMyMeds. Will update once we receive a response.

## 2022-05-13 ENCOUNTER — Ambulatory Visit: Payer: Medicare Other | Admitting: Physical Therapy

## 2022-05-13 ENCOUNTER — Encounter: Payer: Self-pay | Admitting: Physical Therapy

## 2022-05-13 DIAGNOSIS — R278 Other lack of coordination: Secondary | ICD-10-CM

## 2022-05-13 DIAGNOSIS — M6281 Muscle weakness (generalized): Secondary | ICD-10-CM

## 2022-05-13 DIAGNOSIS — R2689 Other abnormalities of gait and mobility: Secondary | ICD-10-CM

## 2022-05-13 DIAGNOSIS — R2681 Unsteadiness on feet: Secondary | ICD-10-CM

## 2022-05-13 NOTE — Patient Instructions (Signed)
-   Standing Median Nerve Glide  - 1 x daily - 7 x weekly - 3 sets - 10 reps

## 2022-05-13 NOTE — Telephone Encounter (Signed)
Patient notified via my chart on 05/10/2022.

## 2022-05-13 NOTE — Therapy (Unsigned)
OUTPATIENT PHYSICAL THERAPY NEURO TREATMENT   Patient Name: Jaleiah Asay MRN: [id number] DOB:[birth year], 66 y.o., female Today's Date: 05/13/2022   PCP: Ria Bush, MD REFERRING PROVIDER: Courtney Heys, MD    PT End of Session - 05/13/22 1112     Visit Number 15    Number of Visits 23   18 + eval   Date for PT Re-Evaluation 05/27/22    Authorization Type [employer] MEDICARE    Progress Note Due on Visit 20    PT Start Time [place] During Treatment --    Activity Tolerance Patient tolerated treatment well    Behavior During Therapy WFL for tasks assessed/performed                Past Medical History:  Diagnosis Date   Abdominal pain last 4 months   and nausea also   Allergy    Anemia    history of   Anxiety    Bipolar disorder ([place])    atpical bipolar disorder   Cervical disc disease limited rom turning to left   hx. C6- C7 -hx. past fusion(bone graft used)   Cholecystitis    Chronic pain    DDD (degenerative disc disease), lumbar 09/2015   dextroscoliosis with multilevel DDD and facet arthrosis most notable for R foraminal disc protrusion L4/5 producing severe R neural foraminal stenosis abutting R L4 nerve root, moderate spinal canal and mild lat recesss and R neural foraminal stenosis L3/4 (MRI)   Depression    bipolar depression   Disorders of porphyrin metabolism    Felon of finger of left hand 11/10/2016   Fibromyalgia    GERD (gastroesophageal reflux disease)    Headache    occasionally   Internal hemorrhoids    Interstitial cystitis 06/06/2012   hx.   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 (Dr Baird Cancer retinologist)   Tuberculosis    Past Surgical History:  Procedure Laterality Date   ANTERIOR CERVICAL DECOMP/DISCECTOMY FUSION  2004   C5/6, C6/7   ANTERIOR CERVICAL DECOMP/DISCECTOMY FUSION  02/2016   C3/4, C4/5 with plating [name])   ANTERIOR CERVICAL DECOMP/DISCECTOMY FUSION  12/29/2021   Procedure: ANTERIOR CERVICAL DECOMPRESSION/ DISCECTOMY FUSION CERVICAL THREE - SEVEN;  Surgeon: Newman Pies, MD;  Location: [place];  Service: Neurosurgery;;   ANTERIOR CERVICAL DECOMPRESSION/DISCECTOMY FUSION 4 LEVELS N/A 12/29/2021   Procedure: CERVICAL FIVE-SIX CORPECTOMY,;  Surgeon: Newman Pies, MD;  Location: [place];  Service: Neurosurgery;  Laterality: N/A;   AUGMENTATION MAMMAPLASTY Bilateral 03/25/2010   BREAST ENHANCEMENT SURGERY  2010   BREAST IMPLANT EXCHANGE  10/2014   exchange saline implants, B mastopexy/capsulorraphy ([facility])   BUNIONECTOMY Bilateral yrs ago   Bushyhead   x 1   CHOLECYSTECTOMY  06/11/2012   Procedure: LAPAROSCOPIC CHOLECYSTECTOMY WITH INTRAOPERATIVE CHOLANGIOGRAM;  Surgeon: [name], MD,FACS;  Location: [facility];  Service: General;  Laterality: N/A;   COLONOSCOPY  02/2018   done for  positive cologuard - WNL, rpt 10 yrs Fuller Plan)   CYSTOSCOPY     ERCP  05/22/2012   Procedure: ENDOSCOPIC RETROGRADE CHOLANGIOPANCREATOGRAPHY (ERCP);  Surgeon: [name], MD,FACG;  Location: [facility];  Service: Endoscopy;  Laterality: N/A;   ERCP N/A 09/17/2013   Procedure: ENDOSCOPIC RETROGRADE CHOLANGIOPANCREATOGRAPHY (ERCP);  Surgeon: [name], MD;  Location: [facility];  Service: Endoscopy;  Laterality: N/A;   ERCP N/A 09/27/2018   Procedure: ENDOSCOPIC RETROGRADE CHOLANGIOPANCREATOGRAPHY (ERCP);  Surgeon: [name], MD;  Location: [facility];  Service: Endoscopy;  Laterality: N/A;    ESOPHAGOGASTRODUODENOSCOPY  09/2016   WNL. esophagus dilated Fuller Plan)   HEMORRHOID BANDING  09/23/2013   --Dr. Greer Pickerel   HERNIA REPAIR     inguinal   HYSTEROSCOPY W/ ENDOMETRIAL ABLATION     KNEE ARTHROSCOPY Right 04/06/2021   Procedure: RIGHT KNEE ARTHROSCOPY WITH DEBRIDEMENT;  Surgeon: [name], MD;  Location: [place];  Service: Orthopedics;  Laterality: Right;   NASAL SINUS SURGERY     x5   PARTIAL KNEE ARTHROPLASTY Right 06/01/2020   Procedure: Right knee patellofemoral replacement;  Surgeon: [name], MD;  Location: [place];  Service: Orthopedics;  Laterality: Right;   POSTERIOR CERVICAL FUSION/FORAMINOTOMY N/A 12/29/2021   Procedure: POSTERIOR CERVICAL FUSION CERVICAL THREE-SEVEN;  Surgeon: Newman Pies, MD;  Location: [place];  Service: Neurosurgery;  Laterality: N/A;   REMOVAL OF STONES  09/27/2018   Procedure: REMOVAL OF STONES;  Surgeon: [name], MD;  [facility];  Service: Endoscopy;;   SPHINCTEROTOMY  09/27/2018   Procedure: SPHINCTEROTOMY;  Surgeon: [name], MD;  [facility];  Service: Endoscopy;;   TONSILLECTOMY     removed as a child   TOTAL HIP ARTHROPLASTY Left 03/12/2019   Procedure: LEFT TOTAL HIP ARTHROPLASTY ANTERIOR APPROACH;  [name], [name], MD)   UPPER GASTROINTESTINAL ENDOSCOPY     Patient Active Problem List   Diagnosis Date Noted   Sore on toe 04/21/2022   Fusion of spine, cervical region    Anemia    Primary hypertension    Cervical spine arthritis with nerve pain 12/29/2021   C6 cervical fracture ([place]) 12/29/2021   C5 cervical fracture ([place]) 12/21/2021   Advanced directives, counseling/discussion 09/23/2021   Swelling of right index finger 07/14/2021   Synovitis of right knee    Scoliosis 01/13/2021   Osteopenia 07/02/2020   Status post left knee surgery 06/01/2020   Pulmonary nodule 05/27/2020   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[place])  05/05/2020   Chronic patellofemoral pain of right knee 05/05/2020   Positive ANA (antinuclear antibody) 06/05/2019   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   Unilateral primary osteoarthritis, left hip 01/28/2019   Pelvic pain 12/12/2018   Common bile duct (CBD) obstruction    Venous insufficiency of left lower extremity 07/04/2018   Dyslipidemia 09/22/2017   DDD (degenerative disc disease), cervical 04/03/2017   DDD (degenerative disc disease), lumbar 04/03/2017   Onychomycosis 03/21/2017   Dysphagia 10/18/2016   Encounter for chronic pain management 10/18/2016   Biliary stasis 09/26/2016   Dry mouth 06/06/2016   HNP (herniated nucleus pulposus), lumbar 09/23/2015   Iron deficiency 07/30/2015   Health maintenance examination 06/15/2015   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   Clavicle enlargement 12/13/2014   Prediabetes 12/13/2014   Plantar fasciitis, bilateral 09/11/2014   Medicare annual wellness visit, subsequent 06/10/2014   Abnormal thyroid function test 06/10/2014   Chronic pain syndrome 06/10/2014   CFS (  chronic fatigue syndrome) 04/08/2014   Postmenopausal atrophic vaginitis 10/19/2012   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   Cervical disc disorder with radiculopathy of cervical region 05/28/2010   APHTHOUS ULCERS 01/31/2008   Insomnia 11/09/2007   Constipation 10/11/2007   Allergic rhinitis 04/12/2007   MDD (major depressive disorder), recurrent episode, moderate ([place]) 03/05/2007   [name] syndrome 03/05/2007   GERD 03/05/2007   ROSACEA 03/05/2007   NEURALGIA 03/05/2007   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[place]) 12/06/2006   Chronic interstitial cystitis 12/06/2006   Fibromyalgia 12/06/2006    ONSET DATE: 01/12/2022 (referral)  REFERRING DIAG: M54.10 (ICD-10-CM) - Radiculopathy, site unspecified M43.22 (ICD-10-CM) - Fusion of spine, cervical region   THERAPY DIAG:  Other lack of coordination  Muscle weakness (generalized)  Unsteadiness  on feet  Other abnormalities of gait and mobility  Rationale for Evaluation and Treatment Rehabilitation   PERTINENT HISTORY: Chronic pain, [name] syndrome, GERD, cervical radiculopathy, fibromyalgia, hereditary macular degeneration (she has most trouble with peripheral vision), herniated lumbar disc L4/L5, DDD, Partial right knee arthroplasty, scoliosis  From rehab admit note:  "ACDF 2004 C5-6 and C6-7 and 2017 C3-4 and C4-5, left total hip arthroplasty 2020, scleroderma.  Presented 12/29/2021 with noted history of fall 10/29/2021 suffering a C5-6 fracture subluxation/cervical kyphosis with persistent neck pain and initial conservative care with failed medical management.  Underwent 3 stage anterior posterior fusion C5-6 corpectomy, posterior fusion C3-7, ACDF C3-7 12/29/2021.  Postop she developed left hand weakness consistent with a C8 radiculopathy as well as small amount of epidural fluid at C5-6.  Cervical CT demonstrated that the left T1 pedicle screw was a bit low and into the neural foramen.  Patient underwent revision of posterior cervical instrumentation 12/30/2021 per Dr. Newman Pies.  Placed in a cervical hard collar when out of bed."    PRECAUTIONS: Cervical and Fall-updated precautions scanned into other media 03/16/2022.   SUBJECTIVE: Pt drove herself today, has been for a couple of weeks.  Received new precautions (scanned into chart).  She continues to have thumb N/T and right knee pain.  Wellsite geologist cleared her to return to [employer] and she wants to gradually work into walking her dogs again.                                                                                                                                                                                            PAIN:   Are you having pain? Yes: NPRS scale: 2/10 Pain location: left index and thumb and right knee Pain description: N/T w/ hypersensitivity in left thumb and forefinger Aggravating factors: touching  cold/hot, mornings Relieving factors: walking (  for knee)  TODAY'S TREATMENT:  PT has front office scan in new precautions and verbally reviews with patient.    Reviewed median, radial, and ulnar nerve glides w/o head tension (neck neutral).  Pt has mild response to median glide-added to HEP.  Gentle cervical AROM (flexion, extension, rotation left and right, and bilateral side-lying).  Added prolonged hold at end of each-holding w/ light stretch, no pain.  Several reps performed.  Edu to work on at home.  Light chin tucks in sitting x15  Discussed mechanics of lifting, pushing, pulling especially in context of vacuuming.  Pt ambulates 345' w/ simulated dog walking scenario w/ leash perturbations  Pt practices squatting to shortened bench height x5 to mimic home setup for dusting low furniture.  PATIENT EDUCATION: Education details: Additions to HEP and walking program.   Person educated: Patient Education method: Explanation Education comprehension: verbalized understanding  HOME EXERCISE PROGRAM: Access Code: [email or web address]  NEW GOALS: Goals reviewed with patient? Yes  SHORT TERM GOALS=LONG TERM GOALS: Target date: 05/27/2022  Pt will be independent with strength and stretching HEP for cervical ROM when cleared by MD. Baseline:  ROM remains limited Goal status: INITIAL  2.  Pt will improve FGA score to >/=25/30 in order to demonstrate improved balance and decreased fall risk. Baseline: 21/30 Goal status: INITIAL  3.  Cervical rotation AROM will improve to >/= 30 degrees left and right to improve safe navigation in unfamiliar environments and eventual return to driving as approved by MD. Baseline: Limited to close to neutral Goal status: INITIAL   ASSESSMENT:  CLINICAL IMPRESSION: Continued to work on functional activities within parameters of modified updated precautions.  Began adding higher level balance and strength tasks that mimic pt return to functioning in home  setting with ADL tasks and in community.  Pt tolerates all challenges well and is making great progress towards her LTGs.   OBJECTIVE IMPAIRMENTS Abnormal gait, decreased activity tolerance, decreased balance, decreased endurance, decreased mobility, difficulty walking, decreased strength, impaired UE functional use, and pain.   ACTIVITY LIMITATIONS carrying, lifting, bending, squatting, and stairs  PARTICIPATION LIMITATIONS: driving, community activity, and yard work  PERSONAL FACTORS Age, Fitness, Past/current experiences, Time since onset of injury/illness/exacerbation, and 1-2 comorbidities: chronic pain and DDD  are also affecting patient's functional outcome.   REHAB POTENTIAL: Good  CLINICAL DECISION MAKING: Evolving/moderate complexity  EVALUATION COMPLEXITY: Moderate  PLAN: PT FREQUENCY:  1x/wk for 12 weeks (Discussed pt progress with pt and limitations in husband's schedule due to upcoming school year and dropping to 1x/wk for remainder of visits-was previously 2x/wk for 6 wks followed by 1x/wk for 6 wks)  PT DURATION: 12 weeks (cert 14 weeks - 76/08/8313)  PLANNED INTERVENTIONS: Therapeutic exercises, Therapeutic activity, Neuromuscular re-education, Balance training, Gait training, Patient/Family education, Joint mobilization, Stair training, Vestibular training, DME instructions, Cryotherapy, Moist heat, Manual therapy, and Re-evaluation  PLAN FOR NEXT SESSION:  practice gentle chin tucks and light ROM close to neutral, trunk rotation, resisted walking/lateral stepping, right step downs/ups-add to HEP, lifting from floor when restrictions lifted, push/pull once restrictions lifted, leash scenario outside over unlevel sidewalk, 5# lifting and carrying (groceries, stairs, laundry)  Elease Etienne, PT, DPT [facility] [address] [place], [place] [address] [phone] 05/13/22, 11:13 AM

## 2022-05-13 NOTE — Telephone Encounter (Signed)
Per response on CMM: This medication or product was previously approved on [id number] from 07/25/2021 to 07/24/2022. You will be able to fill a prescription for this medication at your pharmacy. If your pharmacy has questions regarding the processing of your prescription, please have them call the [facility] pharmacy help desk at [phone]. **Please note: Please contact your pharmacy to resubmit your claim using an alternative NDC which is properly listed with the FDA.  Knox Saliva, PharmD, MPH, BCPS, CPP Clinical Pharmacist (Rheumatology and Pulmonology)

## 2022-05-16 ENCOUNTER — Encounter: Payer: Self-pay | Admitting: Physical Therapy

## 2022-05-16 ENCOUNTER — Ambulatory Visit (INDEPENDENT_AMBULATORY_CARE_PROVIDER_SITE_OTHER): Payer: Medicare Other | Admitting: Pulmonary Disease

## 2022-05-16 ENCOUNTER — Ambulatory Visit: Payer: Medicare Other | Admitting: Physical Therapy

## 2022-05-16 ENCOUNTER — Encounter: Payer: Self-pay | Admitting: Pulmonary Disease

## 2022-05-16 VITALS — BP 128/76 | HR 87 | Temp 98.5°F | Ht 63.0 in | Wt 124.2 lb

## 2022-05-16 DIAGNOSIS — R278 Other lack of coordination: Secondary | ICD-10-CM

## 2022-05-16 DIAGNOSIS — R911 Solitary pulmonary nodule: Secondary | ICD-10-CM

## 2022-05-16 DIAGNOSIS — M349 Systemic sclerosis, unspecified: Secondary | ICD-10-CM

## 2022-05-16 DIAGNOSIS — M6281 Muscle weakness (generalized): Secondary | ICD-10-CM

## 2022-05-16 DIAGNOSIS — R2689 Other abnormalities of gait and mobility: Secondary | ICD-10-CM

## 2022-05-16 DIAGNOSIS — R2681 Unsteadiness on feet: Secondary | ICD-10-CM

## 2022-05-16 NOTE — Progress Notes (Signed)
Rachel Duran    [id number]    19-Oct-1955  Primary Care Physician:Gutierrez, Garlon Hatchet, MD  Referring Physician: Ria Bush, MD [place],  [place] [id number]  Chief complaint: Follow-up for ILD evaluation  HPI: 66 year old with history of [name], fibromyalgia with recent diagnosis of scleroderma Referred for evaluation of interstitial lung disease.  She has a strong family history of scleroderma and longstanding [name] phenomena.  More recently she has developed skin thickening over the digits and findings of  capillary dilation and dropout on dermatology evaluation supporting a diagnosis of systemic sclerosis.  She has history of dysphagia and follows with Dr. Fuller Plan, gastroenterology.  Underwent esophageal dilatation around 2018.  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  She has also been referred to Dr. Haroldine Laws for evaluation of pulmonary hypertension She was evaluated by Dr. Haroldine Laws with no evidence of pulmonary hypertension on echocardiogram.  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  No mold, hot tub, Jacuzzi Smoking history: Never smoker Travel history: Originally from [place].  No significant recent travel Relevant family history: Mother had scleroderma with interstitial lung disease and pulmonary hypertension.  Interim history: Continues to do well.  No problems with dyspnea Had cervical fusion in June 2023 and is making slow progress   Outpatient Encounter Medications as of 05/16/2022  Medication Sig   ALPRAZolam (XANAX) 1 MG tablet Take 0.5 tablets (0.5 mg total) by mouth at bedtime.   amLODipine (NORVASC) 2.5 MG tablet Take 1 tablet (2.5 mg total) by mouth daily.   amLODipine (NORVASC) 5 MG tablet Take 1 tablet (5 mg total) by mouth daily.   amphetamine-dextroamphetamine (ADDERALL XR)  30 MG 24 hr capsule TAKE 1 CAPSUEL BY MOUTH EACH MORNING   ARIPiprazole (ABILIFY) 5 MG tablet Take 5 mg by mouth at bedtime.   aspirin 81 MG chewable tablet Chew 1 tablet (81 mg total) by mouth daily.   Calcium Carbonate-Vitamin D (CALTRATE 600+D PO) Take 2 tablets by mouth daily.   clindamycin (CLEOCIN) 150 MG capsule Take '600mg'$  1 hour prior to dental procedure   cyanocobalamin (,VITAMIN B-12,) 1000 MCG/ML injection INJECT 1ML INTO THE MUSCLE EVERY 21 DAYSAS DIRECTED (Patient taking differently: Inject 1,000 mcg into the muscle See admin instructions. Every 21 days)   Eszopiclone 3 MG TABS TAKE 1 TABLET BY MOUTH AT BEDTIME IMMEDIATLEY AS DIRECTED.   ferrous sulfate 325 (65 FE) MG tablet Take 1 tablet (325 mg total) by mouth every Monday, Wednesday, and Friday.   lamoTRIgine (LAMICTAL) 200 MG tablet Take 1 tablet (200 mg total) by mouth daily.   lidocaine (LIDODERM) 5 % Place 3 patches onto the skin daily. Remove & Discard patch within 12 hours or as directed by MD   lidocaine (XYLOCAINE) 5 % ointment Apply 1 Application topically as needed. Apply up to 4x/day- on LUE for nerve pain   linaclotide (LINZESS) 290 MCG CAPS capsule Take 1 capsule (290 mcg total) by mouth daily.   morphine (MSIR) 15 MG tablet TAKE 1 TABLET BY MOUTH EVERY 4 HOURS AS NEEDED FOR MODERATE PAIN   naloxegol oxalate (MOVANTIK) 12.5 MG TABS tablet Take 1 tablet (12.5 mg total) by mouth daily.   NONFORMULARY OR COMPOUNDED ITEM Testosterone proprionate petrolatum (jar) 2% ointment  2 Grams S:  apply a small amount once daily x 5 days per week.  ondansetron (ZOFRAN) 4 MG tablet Take 4 mg by mouth every 8 (eight) hours as needed for nausea or vomiting.   ondansetron (ZOFRAN-ODT) 4 MG disintegrating tablet Take 4 mg by mouth every 6 (six) hours as needed for nausea or vomiting.   pantoprazole (PROTONIX) 40 MG tablet TAKE ONE TABLET TWICE A DAY WITH MEALS   pentosan polysulfate (ELMIRON) 100 MG capsule Take 200 mg by mouth 2 (two)  times daily.    polyethylene glycol (MIRALAX / GLYCOLAX) 17 g packet Take 17 g by mouth 2 (two) times daily.   Prasterone (INTRAROSA) 6.5 MG INST Place one per vagina at bedtime.   pregabalin (LYRICA) 150 MG capsule Take 1 capsule (150 mg total) by mouth in the morning, at noon, and at bedtime.   ursodiol (ACTIGALL) 300 MG capsule TAKE 1 CAPSULE BY MOUTH TWICE DAILY   No facility-administered encounter medications on file as of 05/16/2022.   Physical Exam: Blood pressure 128/76, pulse 87, temperature 98.5 F (36.9 C), temperature source Oral, height '5\' 3"'$  (1.6 m), weight 124 lb 3.2 oz (56.3 kg), last menstrual period 07/25/1998, SpO2 95 %. Gen:      No acute distress HEENT:  EOMI, sclera anicteric Neck:     No masses; no thyromegaly Lungs:    Clear to auscultation bilaterally; normal respiratory effort CV:         Regular rate and rhythm; no murmurs Abd:      + bowel sounds; soft, non-tender; no palpable masses, no distension Ext:    No edema; adequate peripheral perfusion Skin:      Warm and dry; no rash Neuro: alert and oriented x 3 Psych: normal mood and affect   Data Reviewed: Imaging: Chest x-ray 10/18/2016-no acute cardiopulmonary abnormality.   High-resolution CT 05/08/2020-no evidence of fibrotic interstitial lung disease, mild tubular bronchiectasis, air trapping, 6 mm nodule in the right pulmonary apex.  Aortic atherosclerosis. CT chest without contrast 05/01/21- no evidence of ILD, pulmonary nodule stable CT Cardiac 09/17/21-visualized lung images do not show any abnormalities. I have reviewed the images personally.  PFTs: 07/07/2020 FVC 3.18 [96%], FEV1 2.63 [103%], F/F 83, TLC 5.20 [101%], DLCO 17.43 [85%] Normal test  Cardiac: Echocardiogram 06/22/2020 LVEF 54-65%, grade 1 diastolic dysfunction, normal RV systolic size and function.  Assessment:  Consult for interstitial lung disease Limited systemic sclerosis No evidence of interstitial lung disease on CT scan,  PFTs are normal She has mild air trapping and bronchiectasis which can be observed without treatment Echo with no pulmonary hypertension.  She has follow-up with Dr. Haroldine Laws Reassured the patient.   Cardiac CT earlier this year reviewed with normal lung images Get high-res CT in 6 months and return to clinic  Lung nodule Follow-up CT reviewed with stable lung nodule. This is likely benign in a non-smoker.  Does not need annual follow-up  Plan/Recommendations: High-res CT in 6 months.  Marshell Garfinkel MD [facility] 05/16/2022, 3:10 PM  CC: Ria Bush, MD

## 2022-05-16 NOTE — Patient Instructions (Signed)
I am glad you are doing well with your breathing We will order CT chest high-resolution in 6 months Return to clinic in 6 months.

## 2022-05-16 NOTE — Therapy (Signed)
OUTPATIENT PHYSICAL THERAPY NEURO TREATMENT   Patient Name: Rachel Duran MRN: [id number] DOB:[birth year], 66 y.o., female Today's Date: 05/16/2022   PCP: Ria Bush, MD REFERRING PROVIDER: Courtney Heys, MD    PT End of Session - 05/16/22 1318     Visit Number 16    Number of Visits 23   18 + eval   Date for PT Re-Evaluation 05/27/22    Authorization Type [employer]ICARE    Progress Note Due on Visit 20    PT Start Time 1315    PT Stop Time 1355    PT Time Calculation (min) 40 min    Activity Tolerance Patient tolerated treatment well    Behavior During Therapy WFL for tasks assessed/performed                Past Medical History:  Diagnosis Date   Abdominal pain last 4 months   and nausea also   Allergy    Anemia    history of   Anxiety    Bipolar disorder ([place])    atpical bipolar disorder   Cervical disc disease limited rom turning to left   hx. C6- C7 -hx. past fusion(bone graft used)   Cholecystitis    Chronic pain    DDD (degenerative disc disease), lumbar 09/2015   dextroscoliosis with multilevel DDD and facet arthrosis most notable for R foraminal disc protrusion L4/5 producing severe R neural foraminal stenosis abutting R L4 nerve root, moderate spinal canal and mild lat recesss and R neural foraminal stenosis L3/4 (MRI)   Depression    bipolar depression   Disorders of porphyrin metabolism    Felon of finger of left hand 11/10/2016   Fibromyalgia    GERD (gastroesophageal reflux disease)    Headache    occasionally   Internal hemorrhoids    Interstitial cystitis 06/06/2012   hx.   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 (Dr Baird Cancer retinologist)   Tuberculosis    Past Surgical History:  Procedure Laterality Date   ANTERIOR CERVICAL DECOMP/DISCECTOMY FUSION  2004   C5/6, C6/7   ANTERIOR CERVICAL DECOMP/DISCECTOMY FUSION  02/2016   C3/4, C4/5 with plating [name])   ANTERIOR CERVICAL DECOMP/DISCECTOMY FUSION  12/29/2021   Procedure: ANTERIOR CERVICAL DECOMPRESSION/ DISCECTOMY FUSION CERVICAL THREE - SEVEN;  Surgeon: Newman Pies, MD;  Location: [place];  Service: Neurosurgery;;   ANTERIOR CERVICAL DECOMPRESSION/DISCECTOMY FUSION 4 LEVELS N/A 12/29/2021   Procedure: CERVICAL FIVE-SIX CORPECTOMY,;  Surgeon: Newman Pies, MD;  Location: [place];  Service: Neurosurgery;  Laterality: N/A;   AUGMENTATION MAMMAPLASTY Bilateral 03/25/2010   BREAST ENHANCEMENT SURGERY  2010   BREAST IMPLANT EXCHANGE  10/2014   exchange saline implants, B mastopexy/capsulorraphy ([facility])   BUNIONECTOMY Bilateral yrs ago   [place]   x 1   CHOLECYSTECTOMY  06/11/2012   Procedure: LAPAROSCOPIC CHOLECYSTECTOMY WITH INTRAOPERATIVE CHOLANGIOGRAM;  Surgeon: [name], MD,FACS;  Location: [facility];  Service: General;  Laterality: N/A;  COLONOSCOPY  02/2018   done for positive cologuard - WNL, rpt 10 yrs Fuller Plan)   CYSTOSCOPY     ERCP  05/22/2012   Procedure: ENDOSCOPIC RETROGRADE CHOLANGIOPANCREATOGRAPHY (ERCP);  Surgeon: [name], MD,FACG;  Location: [facility];  Service: Endoscopy;  Laterality: N/A;   ERCP N/A 09/17/2013   Procedure: ENDOSCOPIC RETROGRADE CHOLANGIOPANCREATOGRAPHY (ERCP);  Surgeon: [name], MD;  Location: [facility];  Service: Endoscopy;  Laterality: N/A;   ERCP N/A 09/27/2018   Procedure: ENDOSCOPIC RETROGRADE CHOLANGIOPANCREATOGRAPHY (ERCP);  Surgeon: [name], MD;  Location: [facility];  Service: Endoscopy;  Laterality: N/A;    ESOPHAGOGASTRODUODENOSCOPY  09/2016   WNL. esophagus dilated Fuller Plan)   HEMORRHOID BANDING  09/23/2013   --Dr. Greer Pickerel   HERNIA REPAIR     inguinal   HYSTEROSCOPY W/ ENDOMETRIAL ABLATION     KNEE ARTHROSCOPY Right 04/06/2021   Procedure: RIGHT KNEE ARTHROSCOPY WITH DEBRIDEMENT;  Surgeon: [name], MD;  Location: [place];  Service: Orthopedics;  Laterality: Right;   NASAL SINUS SURGERY     x5   PARTIAL KNEE ARTHROPLASTY Right 06/01/2020   Procedure: Right knee patellofemoral replacement;  Surgeon: [name], MD;  Location: [place];  Service: Orthopedics;  Laterality: Right;   POSTERIOR CERVICAL FUSION/FORAMINOTOMY N/A 12/29/2021   Procedure: POSTERIOR CERVICAL FUSION CERVICAL THREE-SEVEN;  Surgeon: Newman Pies, MD;  Location: [place];  Service: Neurosurgery;  Laterality: N/A;   REMOVAL OF STONES  09/27/2018   Procedure: REMOVAL OF STONES;  Surgeon: [name], MD;  Location: [facility];  Service: Endoscopy;;   SPHINCTEROTOMY  09/27/2018   Procedure: SPHINCTEROTOMY;  Surgeon: [name], MD;  Location[facility];  Service: Endoscopy;;   TONSILLECTOMY     removed as a child   TOTAL HIP ARTHROPLASTY Left 03/12/2019   Procedure: LEFT TOTAL HIP ARTHROPLASTY ANTERIOR APPROACH;  [name], [name], MD)   UPPER GASTROINTESTINAL ENDOSCOPY     Patient Active Problem List   Diagnosis Date Noted   Sore on toe 04/21/2022   Fusion of spine, cervical region    Anemia    Primary hypertension    Cervical spine arthritis with nerve pain 12/29/2021   C6 cervical fracture ([place]) 12/29/2021   C5 cervical fracture ([place]) 12/21/2021   Advanced directives, counseling/discussion 09/23/2021   Swelling of right index finger 07/14/2021   Synovitis of right knee    Scoliosis 01/13/2021   Osteopenia 07/02/2020   Status post left knee surgery 06/01/2020   Pulmonary nodule 05/27/2020   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   Chronic patellofemoral pain of right knee 05/05/2020   Positive ANA (antinuclear antibody) 06/05/2019   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   Unilateral primary osteoarthritis, left hip 01/28/2019   Pelvic pain 12/12/2018   Common bile duct (CBD) obstruction    Venous insufficiency of left lower extremity 07/04/2018   Dyslipidemia 09/22/2017   DDD (degenerative disc disease), cervical 04/03/2017   DDD (degenerative disc disease), lumbar 04/03/2017   Onychomycosis 03/21/2017   Dysphagia 10/18/2016   Encounter for chronic pain management 10/18/2016   Biliary stasis 09/26/2016   Dry mouth 06/06/2016   HNP (herniated nucleus pulposus), lumbar 09/23/2015   Iron deficiency 07/30/2015   Health maintenance examination 06/15/2015   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   Clavicle enlargement 12/13/2014   Prediabetes 12/13/2014   Plantar fasciitis, bilateral 09/11/2014   Medicare annual wellness visit, subsequent 06/10/2014   Abnormal thyroid function test 06/10/2014  Chronic pain syndrome 06/10/2014   CFS (chronic fatigue syndrome) 04/08/2014   Postmenopausal atrophic vaginitis 10/19/2012   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   Cervical disc disorder with radiculopathy of cervical region 05/28/2010   APHTHOUS ULCERS 01/31/2008   Insomnia 11/09/2007   Constipation 10/11/2007   Allergic rhinitis 04/12/2007   MDD (major depressive disorder), recurrent episode, moderate ([place]) 03/05/2007   [name] syndrome 03/05/2007   GERD 03/05/2007   ROSACEA 03/05/2007   NEURALGIA 03/05/2007   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[place]) 12/06/2006   Chronic interstitial cystitis 12/06/2006   Fibromyalgia 12/06/2006    ONSET DATE: 01/12/2022 (referral)  REFERRING DIAG: M54.10 (ICD-10-CM) - Radiculopathy, site unspecified M43.22 (ICD-10-CM) - Fusion of spine, cervical region   THERAPY DIAG:  Other lack of coordination  Muscle weakness (generalized)  Unsteadiness  on feet  Other abnormalities of gait and mobility  Rationale for Evaluation and Treatment Rehabilitation   PERTINENT HISTORY: Chronic pain, [name] syndrome, GERD, cervical radiculopathy, fibromyalgia, hereditary macular degeneration (she has most trouble with peripheral vision), herniated lumbar disc L4/L5, DDD, Partial right knee arthroplasty, scoliosis  From rehab admit note:  "ACDF 2004 C5-6 and C6-7 and 2017 C3-4 and C4-5, left total hip arthroplasty 2020, scleroderma.  Presented 12/29/2021 with noted history of fall 10/29/2021 suffering a C5-6 fracture subluxation/cervical kyphosis with persistent neck pain and initial conservative care with failed medical management.  Underwent 3 stage anterior posterior fusion C5-6 corpectomy, posterior fusion C3-7, ACDF C3-7 12/29/2021.  Postop she developed left hand weakness consistent with a C8 radiculopathy as well as small amount of epidural fluid at C5-6.  Cervical CT demonstrated that the left T1 pedicle screw was a bit low and into the neural foramen.  Patient underwent revision of posterior cervical instrumentation 12/30/2021 per Dr. Newman Pies.  Placed in a cervical hard collar when out of bed."    PRECAUTIONS: Cervical and Fall-updated precautions scanned into other media 03/16/2022.   SUBJECTIVE: Pt drove herself today, has been for a couple of weeks.  Received new precautions (scanned into chart).  She continues to have thumb N/T and right knee pain.  Wellsite geologist cleared her to return to [employer] and she wants to gradually work into walking her dogs again.                                                                                                                                                                                            PAIN:   Are you having pain? Yes: NPRS scale: 2/10 Pain location: left index and thumb and right knee Pain description: N/T w/ hypersensitivity in left thumb and forefinger Aggravating factors:  touching  cold/hot, mornings Relieving factors: walking (for knee)  TODAY'S TREATMENT:  -5# kettlebell squats x10 -5# kettlebell overhead lift bimanually into cabinet > attempts w/ RUE unilaterally w/ some compensation noted so activity d/c'd -5# tote 2x230' switching UE midway > stairs 2x4 w/ left rail and 5# kettlebell carry to mimic grocery carry, etc. -Pt ambulates 1000' outdoors over unlevel sidewalk and grass w/ anterior tugs using resistance cord.  Pt tolerates challenge without shoulder/cervical compensation, able to transition surfaces without LOB and hold normal conversation throughout.  Discussion of plan for d/c next session, instructed pt to try walking her dogs holding the leash with husband supervision on first attempt.  Answered pt questions regarding body mechanics in various scenarios and return to normal activities.  Pt feels ready to assess LTGs and discharge next session.  Discussed progression of strengthening exercises and provided green theraband.  PATIENT EDUCATION: Education details: Additions to HEP and walking program.   Person educated: Patient Education method: Explanation Education comprehension: verbalized understanding  HOME EXERCISE PROGRAM: Access Code: [email or web address]  NEW GOALS: Goals reviewed with patient? Yes  SHORT TERM GOALS=LONG TERM GOALS: Target date: 05/27/2022  Pt will be independent with strength and stretching HEP for cervical ROM when cleared by MD. Baseline:  ROM remains limited Goal status: INITIAL  2.  Pt will improve FGA score to >/=25/30 in order to demonstrate improved balance and decreased fall risk. Baseline: 21/30 Goal status: INITIAL  3.  Cervical rotation AROM will improve to >/= 30 degrees left and right to improve safe navigation in unfamiliar environments and eventual return to driving as approved by MD. Baseline: Limited to close to neutral Goal status: INITIAL   ASSESSMENT:  CLINICAL IMPRESSION: Focus of skilled session on  addressing remaining concerns surrounding return to normal activities like walking her dogs and moving things around her home environment including groceries.  She tolerates up to 5 lbs well in all positions including overhead without compensation or increased discomfort.  Will plan to discharge next session if pt still performing well in home environment.  Will plan to assess LTGs at next visit.  OBJECTIVE IMPAIRMENTS Abnormal gait, decreased activity tolerance, decreased balance, decreased endurance, decreased mobility, difficulty walking, decreased strength, impaired UE functional use, and pain.   ACTIVITY LIMITATIONS carrying, lifting, bending, squatting, and stairs  PARTICIPATION LIMITATIONS: driving, community activity, and yard work  PERSONAL FACTORS Age, Fitness, Past/current experiences, Time since onset of injury/illness/exacerbation, and 1-2 comorbidities: chronic pain and DDD  are also affecting patient's functional outcome.   REHAB POTENTIAL: Good  CLINICAL DECISION MAKING: Evolving/moderate complexity  EVALUATION COMPLEXITY: Moderate  PLAN: PT FREQUENCY:  1x/wk for 12 weeks (Discussed pt progress with pt and limitations in husband's schedule due to upcoming school year and dropping to 1x/wk for remainder of visits-was previously 2x/wk for 6 wks followed by 1x/wk for 6 wks)  PT DURATION: 12 weeks (cert 14 weeks - 99/02/3381)  PLANNED INTERVENTIONS: Therapeutic exercises, Therapeutic activity, Neuromuscular re-education, Balance training, Gait training, Patient/Family education, Joint mobilization, Stair training, Vestibular training, DME instructions, Cryotherapy, Moist heat, Manual therapy, and Re-evaluation  PLAN FOR NEXT SESSION:  Assess LTGs-D/C?  Did pt walk dogs?  Elease Etienne, PT, DPT Outpatient Neuro [facility] [address], [place], [place] [address] [phone] 05/16/22, 2:03 PM

## 2022-05-23 ENCOUNTER — Ambulatory Visit: Payer: Medicare Other | Admitting: Physical Therapy

## 2022-05-23 ENCOUNTER — Encounter: Payer: Self-pay | Admitting: Physical Therapy

## 2022-05-23 DIAGNOSIS — R2689 Other abnormalities of gait and mobility: Secondary | ICD-10-CM

## 2022-05-23 DIAGNOSIS — R278 Other lack of coordination: Secondary | ICD-10-CM | POA: Diagnosis not present

## 2022-05-23 DIAGNOSIS — R2681 Unsteadiness on feet: Secondary | ICD-10-CM

## 2022-05-23 DIAGNOSIS — M6281 Muscle weakness (generalized): Secondary | ICD-10-CM

## 2022-05-23 NOTE — Therapy (Signed)
OUTPATIENT PHYSICAL THERAPY NEURO TREATMENT   Patient Name: Rachel Duran MRN: [id number] DOB:1955-08-02, 66 y.o., female Today's Date: 05/23/2022   PCP: Ria Bush, MD REFERRING PROVIDER: Courtney Heys, MD   PHYSICAL THERAPY DISCHARGE SUMMARY  Visits from Start of Care: 17  Current functional level related to goals / functional outcomes: See clinical impression statement.   Remaining deficits: None of note.   Education / Equipment: Discharge plan.   Patient agrees to discharge. Patient goals were met. Patient is being discharged due to meeting the stated rehab goals.    PT End of Session - 05/23/22 1321     Visit Number 17    Number of Visits 23   18 + eval   Date for PT Re-Evaluation 05/27/22    Authorization Type [employer] MEDICARE    Progress Note Due on Visit 20    PT Start Time 1317    PT Stop Time 1348    PT Time Calculation (min) 31 min    Activity Tolerance Patient tolerated treatment well    Behavior During Therapy WFL for tasks assessed/performed                Past Medical History:  Diagnosis Date   Abdominal pain last 4 months   and nausea also   Allergy    Anemia    history of   Anxiety    Bipolar disorder ([place])    atpical bipolar disorder   Cervical disc disease limited rom turning to left   hx. C6- C7 -hx. past fusion(bone graft used)   Cholecystitis    Chronic pain    DDD (degenerative disc disease), lumbar 09/2015   dextroscoliosis with multilevel DDD and facet arthrosis most notable for R foraminal disc protrusion L4/5 producing severe R neural foraminal stenosis abutting R L4 nerve root, moderate spinal canal and mild lat recesss and R neural foraminal stenosis L3/4 (MRI)   Depression    bipolar depression   Disorders of porphyrin metabolism    Felon of finger of left hand 11/10/2016   Fibromyalgia    GERD (gastroesophageal reflux disease)    Headache    occasionally   Internal hemorrhoids     Interstitial cystitis 06/06/2012   hx.   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 (Dr Baird Cancer retinologist)   Tuberculosis    Past Surgical History:  Procedure Laterality Date   ANTERIOR CERVICAL DECOMP/DISCECTOMY FUSION  2004   C5/6, C6/7   ANTERIOR CERVICAL DECOMP/DISCECTOMY FUSION  02/2016   C3/4, C4/5 with plating [name])   ANTERIOR CERVICAL DECOMP/DISCECTOMY FUSION  12/29/2021   Procedure: ANTERIOR CERVICAL DECOMPRESSION/ DISCECTOMY FUSION CERVICAL THREE - SEVEN;  Surgeon: Newman Pies, MD;  Location: [place];  Service: Neurosurgery;;   ANTERIOR CERVICAL DECOMPRESSION/DISCECTOMY FUSION 4 LEVELS N/A 12/29/2021   Procedure: CERVICAL FIVE-SIX CORPECTOMY,;  Surgeon: Newman Pies, MD;  Location: [place];  Service: Neurosurgery;  Laterality: N/A;   AUGMENTATION MAMMAPLASTY Bilateral 03/25/2010   BREAST ENHANCEMENT SURGERY  2010  BREAST IMPLANT EXCHANGE  10/2014   exchange saline implants, B mastopexy/capsulorraphy ([facility])   BUNIONECTOMY Bilateral yrs ago   [place]   x 1   CHOLECYSTECTOMY  06/11/2012   Procedure: LAPAROSCOPIC CHOLECYSTECTOMY WITH INTRAOPERATIVE CHOLANGIOGRAM;  Surgeon: [name], MD,FACS;  Location: [facility];  Service: General;  Laterality: N/A;   COLONOSCOPY  02/2018   done for positive cologuard - WNL, rpt 10 yrs Fuller Plan)   CYSTOSCOPY     ERCP  05/22/2012   Procedure: ENDOSCOPIC RETROGRADE CHOLANGIOPANCREATOGRAPHY (ERCP);  Surgeon: [name], MD,FACG;  Location: [facility];  Service: Endoscopy;   Laterality: N/A;   ERCP N/A 09/17/2013   Procedure: ENDOSCOPIC RETROGRADE CHOLANGIOPANCREATOGRAPHY (ERCP);  Surgeon: [name], MD;  Location: [facility];  Service: Endoscopy;  Laterality: N/A;   ERCP N/A 09/27/2018   Procedure: ENDOSCOPIC RETROGRADE CHOLANGIOPANCREATOGRAPHY (ERCP);  Surgeon: [name], MD;  Location: [facility];  Service: Endoscopy;  Laterality: N/A;   ESOPHAGOGASTRODUODENOSCOPY  09/2016   WNL. esophagus dilated Fuller Plan)   HEMORRHOID BANDING  09/23/2013   --Dr. Greer Pickerel   HERNIA REPAIR     inguinal   HYSTEROSCOPY W/ ENDOMETRIAL ABLATION     KNEE ARTHROSCOPY Right 04/06/2021   Procedure: RIGHT KNEE ARTHROSCOPY WITH DEBRIDEMENT;  Surgeon: [name], MD;  Location: [place];  Service: Orthopedics;  Laterality: Right;   NASAL SINUS SURGERY     x5   PARTIAL KNEE ARTHROPLASTY Right 06/01/2020   Procedure: Right knee patellofemoral replacement;  Surgeon: [name], MD;  Location: [place];  Service: Orthopedics;  Laterality: Right;   POSTERIOR CERVICAL FUSION/FORAMINOTOMY N/A 12/29/2021   Procedure: POSTERIOR CERVICAL FUSION CERVICAL THREE-SEVEN;  Surgeon: Newman Pies, MD;  Location: [place];  Service: Neurosurgery;  Laterality: N/A;   REMOVAL OF STONES  09/27/2018   Procedure: REMOVAL OF STONES;  Surgeon: [name], MD;  Location[facility];  Service: Endoscopy;;   SPHINCTEROTOMY  09/27/2018   Procedure: SPHINCTEROTOMY;  Surgeon: [name], MD;  [facility];  Service: Endoscopy;;   TONSILLECTOMY     removed as a child   TOTAL HIP ARTHROPLASTY Left 03/12/2019   Procedure: LEFT TOTAL HIP ARTHROPLASTY ANTERIOR APPROACH;  [name], [name], MD)   UPPER GASTROINTESTINAL ENDOSCOPY     Patient Active Problem List   Diagnosis Date Noted   Sore on toe 04/21/2022   Fusion of spine, cervical region    Anemia    Primary hypertension    Cervical spine arthritis with nerve  pain 12/29/2021   C6 cervical fracture ([place]) 12/29/2021   C5 cervical fracture ([place]) 12/21/2021   Advanced directives, counseling/discussion 09/23/2021   Swelling of right index finger 07/14/2021   Synovitis of right knee    Scoliosis 01/13/2021   Osteopenia 07/02/2020   Status post left knee surgery 06/01/2020   Pulmonary nodule 05/27/2020   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   Chronic patellofemoral pain of right knee 05/05/2020   Positive ANA (antinuclear antibody) 06/05/2019   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   Unilateral primary osteoarthritis, left hip 01/28/2019   Pelvic pain 12/12/2018   Common bile duct (CBD) obstruction    Venous insufficiency of left lower extremity 07/04/2018   Dyslipidemia 09/22/2017   DDD (degenerative disc disease), cervical 04/03/2017   DDD (degenerative disc disease), lumbar 04/03/2017   Onychomycosis 03/21/2017   Dysphagia 10/18/2016   Encounter for chronic pain management 10/18/2016   Biliary stasis 09/26/2016  Dry mouth 06/06/2016   HNP (herniated nucleus pulposus), lumbar 09/23/2015   Iron deficiency 07/30/2015   Health maintenance examination 06/15/2015   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   Clavicle enlargement 12/13/2014   Prediabetes 12/13/2014   Plantar fasciitis, bilateral 09/11/2014   Medicare annual wellness visit, subsequent 06/10/2014   Abnormal thyroid function test 06/10/2014   Chronic pain syndrome 06/10/2014   CFS (chronic fatigue syndrome) 04/08/2014   Postmenopausal atrophic vaginitis 10/19/2012   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   Cervical disc disorder with radiculopathy of cervical region 05/28/2010   APHTHOUS ULCERS 01/31/2008   Insomnia 11/09/2007   Constipation 10/11/2007   Allergic rhinitis 04/12/2007   MDD (major depressive disorder), recurrent episode, moderate ([place]) 03/05/2007   [name] syndrome 03/05/2007   GERD 03/05/2007   ROSACEA 03/05/2007   NEURALGIA 03/05/2007    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[place]) 12/06/2006   Chronic interstitial cystitis 12/06/2006   Fibromyalgia 12/06/2006    ONSET DATE: 01/12/2022 (referral)  REFERRING DIAG: M54.10 (ICD-10-CM) - Radiculopathy, site unspecified M43.22 (ICD-10-CM) - Fusion of spine, cervical region   THERAPY DIAG:  Other lack of coordination  Muscle weakness (generalized)  Unsteadiness on feet  Other abnormalities of gait and mobility  Rationale for Evaluation and Treatment Rehabilitation   PERTINENT HISTORY: Chronic pain, [name] syndrome, GERD, cervical radiculopathy, fibromyalgia, hereditary macular degeneration (she has most trouble with peripheral vision), herniated lumbar disc L4/L5, DDD, Partial right knee arthroplasty, scoliosis  From rehab admit note:  "ACDF 2004 C5-6 and C6-7 and 2017 C3-4 and C4-5, left total hip arthroplasty 2020, scleroderma.  Presented 12/29/2021 with noted history of fall 10/29/2021 suffering a C5-6 fracture subluxation/cervical kyphosis with persistent neck pain and initial conservative care with failed medical management.  Underwent 3 stage anterior posterior fusion C5-6 corpectomy, posterior fusion C3-7, ACDF C3-7 12/29/2021.  Postop she developed left hand weakness consistent with a C8 radiculopathy as well as small amount of epidural fluid at C5-6.  Cervical CT demonstrated that the left T1 pedicle screw was a bit low and into the neural foramen.  Patient underwent revision of posterior cervical instrumentation 12/30/2021 per Dr. Newman Pies.  Placed in a cervical hard collar when out of bed."    PRECAUTIONS: Cervical and Fall-updated precautions scanned into other media 03/16/2022.   SUBJECTIVE: Pt has walked dogs successfully several times since last visit.  She feels "solid" with this.  Continues to have left thumb and forefinger throbbing and N/T especially around 5pm.  This is not getting worse, but also not really noticeably improving.  PAIN:   Are you having pain? Yes: NPRS scale: 6-7/10 Pain location: left index and thumb and right knee Pain description: N/T w/ hypersensitivity in left thumb and forefinger Aggravating factors: touching cold/hot, mornings Relieving factors: walking (for knee)  TODAY'S TREATMENT:  -Reviewed precautions and answered questions related to ADLs and returning to normal activities with comfort and confidence. -Assessed FGA:  [facility] PT Assessment - 05/23/22 1339       Functional Gait  Assessment   Gait assessed  Yes    Gait Level Surface Walks 20 ft in less than 5.5 sec, no assistive devices, good speed, no evidence for imbalance, normal gait pattern, deviates no more than 6 in outside of the 12 in walkway width.    Change in Gait Speed Able to smoothly change walking speed without loss of balance or gait deviation. Deviate no more than 6 in outside of the 12 in walkway width.    Gait with Horizontal Head Turns Performs head turns smoothly with no change in gait. Deviates no more than 6 in outside 12 in walkway width    Gait with Vertical Head Turns Performs head turns with no change in gait. Deviates no more than 6 in outside 12 in walkway width.    Gait and Pivot Turn Pivot turns safely within 3 sec and stops quickly with no loss of balance.    Step Over Obstacle Is able to step over 2 stacked shoe boxes taped together (9 in total height) without changing gait speed. No evidence of imbalance.    Gait with Narrow Base of Support Ambulates 7-9 steps.   7   Gait with Eyes Closed Walks 20 ft, uses assistive device, slower speed, mild gait deviations, deviates 6-10 in outside 12 in walkway width. Ambulates 20 ft in less than 9 sec but greater than 7 sec.    Ambulating Backwards Walks 20 ft, no assistive devices, good speed, no evidence for  imbalance, normal gait    Steps Alternating feet, no rail.    Total Score 28            Cervical rotation AROM: -left 36 deg -right 38 deg  PATIENT EDUCATION: Education details: Additions to HEP and walking program.   Person educated: Patient Education method: Explanation Education comprehension: verbalized understanding  HOME EXERCISE PROGRAM: Access Code: [email or web address]  NEW GOALS: Goals reviewed with patient? Yes  SHORT TERM GOALS=LONG TERM GOALS: Target date: 05/27/2022  Pt will be independent with strength and stretching HEP for cervical ROM when cleared by MD. Baseline:  ROM remains limited; Cervical exercises added and pt is compliant and independent (10/30). Goal status: MET  2.  Pt will improve FGA score to >/=25/30 in order to demonstrate improved balance and decreased fall risk. Baseline: 21/30; 28/30 (10/30) Goal status: MET  3.  Cervical rotation AROM will improve to >/= 30 degrees left and right to improve safe navigation in unfamiliar environments and eventual return to driving as approved by MD. Baseline: Limited to close to neutral; (10/30) pt has returned to driving, has 36 deg to left and 38 deg to right Goal status: MET   ASSESSMENT:  CLINICAL IMPRESSION: Time spent addressing lingering questions and concerns of pt this visit.  Assessed FGA with pt improving from 21/30 to 28/30.  She demonstrates improved cervical rotation close to 40 degrees bilaterally.  She has returned to regular activity including daily walks with her 2 dogs and driving herself.  At this time she is appropriate to and in  agreement with discharge.  OBJECTIVE IMPAIRMENTS Abnormal gait, decreased activity tolerance, decreased balance, decreased endurance, decreased mobility, difficulty walking, decreased strength, impaired UE functional use, and pain.   ACTIVITY LIMITATIONS carrying, lifting, bending, squatting, and stairs  PARTICIPATION LIMITATIONS: driving, community activity, and  yard work  PERSONAL FACTORS Age, Fitness, Past/current experiences, Time since onset of injury/illness/exacerbation, and 1-2 comorbidities: chronic pain and DDD  are also affecting patient's functional outcome.   REHAB POTENTIAL: Good  CLINICAL DECISION MAKING: Evolving/moderate complexity  EVALUATION COMPLEXITY: Moderate  PLAN: PT FREQUENCY:  1x/wk for 12 weeks (Discussed pt progress with pt and limitations in husband's schedule due to upcoming school year and dropping to 1x/wk for remainder of visits-was previously 2x/wk for 6 wks followed by 1x/wk for 6 wks)  PT DURATION: 12 weeks (cert 14 weeks - 73/11/4299)  PLANNED INTERVENTIONS: Therapeutic exercises, Therapeutic activity, Neuromuscular re-education, Balance training, Gait training, Patient/Family education, Joint mobilization, Stair training, Vestibular training, DME instructions, Cryotherapy, Moist heat, Manual therapy, and Re-evaluation  PLAN FOR NEXT SESSION:  N/A  Elease Etienne, PT, DPT Outpatient Neuro [facility] [address], [place], [place] [address] [phone] 05/23/22, 1:57 PM

## 2022-05-27 ENCOUNTER — Emergency Department: Payer: Medicare Other

## 2022-05-27 ENCOUNTER — Other Ambulatory Visit: Payer: Self-pay

## 2022-05-27 ENCOUNTER — Emergency Department
Admission: EM | Admit: 2022-05-27 | Discharge: 2022-05-27 | Disposition: A | Discharge status: Home / Self Care | Payer: Medicare Other | Attending: Emergency Medicine | Admitting: Emergency Medicine

## 2022-05-27 ENCOUNTER — Encounter: Payer: Self-pay | Admitting: Emergency Medicine

## 2022-05-27 DIAGNOSIS — S4992XA Unspecified injury of left shoulder and upper arm, initial encounter: Secondary | ICD-10-CM | POA: Diagnosis present

## 2022-05-27 DIAGNOSIS — S0990XA Unspecified injury of head, initial encounter: Secondary | ICD-10-CM | POA: Insufficient documentation

## 2022-05-27 DIAGNOSIS — W19XXXA Unspecified fall, initial encounter: Secondary | ICD-10-CM | POA: Insufficient documentation

## 2022-05-27 DIAGNOSIS — S42035A Nondisplaced fracture of lateral end of left clavicle, initial encounter for closed fracture: Secondary | ICD-10-CM | POA: Diagnosis not present

## 2022-05-27 NOTE — ED Triage Notes (Signed)
Patient to ED from home for fall. Patient states she was walking dogs with she "took a hard fall." Denies LOC or blood thinners. C/o headache, nausea, left shoulder pain that radiates down into arm. Patient extremely concerned about left shoulder due to having surgery on neck in June with "lots of hardware."

## 2022-05-27 NOTE — ED Provider Triage Note (Signed)
Emergency Medicine Provider Triage Evaluation Note  Rachel Duran , a 66 y.o. female  was evaluated in triage.  Pt complains of left shoulder pain, headache, nausea from fall.  Review of Systems  Positive: Headache, left shoulder pain.  Negative: No LOC  Physical Exam  LMP 07/25/1998  Gen:   Awake, no distress   Resp:  Normal effort  MSK:   Gaurding left shoulder.  Able to ambulate  Other:    Medical Decision Making  Medically screening exam initiated at 2:05 PM.  Appropriate orders placed.  Rachel Duran was informed that the remainder of the evaluation will be completed by another provider, this initial triage assessment does not replace that evaluation, and the importance of remaining in the ED until their evaluation is complete.     Johnn Hai, PA-C 05/27/22 1418

## 2022-05-27 NOTE — Discharge Instructions (Signed)
Take acetaminophen 650 mg and ibuprofen 400 mg every 6 hours for pain.  Take with food. Thank you for choosing [place] for your health care today!  Please see your primary doctor this week for a follow up appointment.   If you do not have a primary doctor call the following clinics to establish care:  If you have insurance:  [facility] [phone] [place] [address]   [facility]  [phone] [address] [place] [address]   If you do not have insurance:  [facility]  [phone] [address], [place] [address]  Sometimes, in the early stages of certain disease courses it is difficult to detect in the emergency department evaluation -- so, it is important that you continue to monitor your symptoms and call your doctor right away or return to the emergency department if you develop any new or worsening symptoms.  It was my pleasure to care for you today.   Hoover Brunette Jacelyn Grip, MD

## 2022-05-27 NOTE — ED Provider Notes (Signed)
[facility] Provider Note    Event Date/Time   First MD Initiated Contact with Patient 05/27/22 [phone]     (approximate)   History   Fall (/)   HPI  Harriette Tovey is a 66 y.o. female   Past medical history of recent C-spine surgery who presents to the emergency department with a mechanical fall after her dogs leashes got tangled up and she fell forward onto her left shoulder.  She is concerned because she had a recent C-spine surgery.  She has some left shoulder pain.  She denies head strike or loss of consciousness.  No other reported medical problems and no other medical complaints or injuries.  History was obtained via the patient and her husband who is at bedside offering collateral information as an independent historian.      Physical Exam   Triage Vital Signs: ED Triage Vitals  Enc Vitals Group     BP 05/27/22 1405 (!) 133/92     Pulse Rate 05/27/22 1405 60     Resp 05/27/22 1405 14     Temp 05/27/22 1405 98.3 F (36.8 C)     Temp Source 05/27/22 1405 Oral     SpO2 05/27/22 1405 99 %     Weight --      Height --      Head Circumference --      Peak Flow --      Pain Score 05/27/22 1410 10     Pain Loc --      Pain Edu? --      Excl. in [place]? --     Most recent vital signs: Vitals:   05/27/22 1405  BP: (!) 133/92  Pulse: 60  Resp: 14  Temp: 98.3 F (36.8 C)  SpO2: 99%    General: Awake, no distress.  CV:  Good peripheral perfusion.  Resp:  Normal effort.  Abd:  No distention.  Other:  [place] survey reveals some tenderness to palpation of the left shoulder with limited range of motion due to pain, the remainder of the left upper extremity is neurovascular intact and no signs of trauma or bony tenderness or deformities.  The remainder of her secondary survey reveals no significant trauma to the head neck thorax back abdomen pelvis or other extremities.   ED Results / Procedures / Treatments   Labs (all labs ordered  are listed, but only abnormal results are displayed) Labs Reviewed - No data to display    RADIOLOGY I independently reviewed and interpreted CT of the head and see no obvious bleed or midline shift   PROCEDURES:  Critical Care performed: No  Procedures   MEDICATIONS ORDERED IN ED: Medications - No data to display   IMPRESSION / MDM / [place] / ED COURSE  I reviewed the triage vital signs and the nursing notes.                              Differential diagnosis includes, but is not limited to, shoulder fracture dislocation, clavicular injury fractures dislocations, intracranial bleeding, C-spine fracture dislocation, other blunt traumatic injuries to the thorax extremities torso abdomen or pelvis   MDM: Patient with questionable irregularity at the distal clavicle left-sided without displacement, placed in a shoulder sling.  The remainder of her imaging was negative for acute traumatic injuries.  She looks well, ambulatory, and has support at home.  Safe for discharge home  with sling and follow-up with PMD.  Patient's presentation is most consistent with acute presentation with potential threat to life or bodily function.       FINAL CLINICAL IMPRESSION(S) / ED DIAGNOSES   Final diagnoses:  Fall, initial encounter  Closed nondisplaced fracture of acromial end of left clavicle, initial encounter     Rx / DC Orders   ED Discharge Orders     None        Note:  This document was prepared using Dragon voice recognition software and may include unintentional dictation errors.    Lucillie Garfinkel, MD 05/27/22 2010

## 2022-05-27 NOTE — ED Notes (Signed)
First Nurse Note: Pt to ED via [place] for fall. Pt states that she mis-stepped and fell off the curb. Pt states that she has had surgery on her neck recently. Pt is having pain in her left shoulder, left arm, left side of her head, and she has stiffness in her neck. Pt states that she did hit her head. Pt does not take blood thinners. Pt denies LOC.

## 2022-05-30 ENCOUNTER — Encounter: Payer: Self-pay | Admitting: Family Medicine

## 2022-05-30 ENCOUNTER — Ambulatory Visit (INDEPENDENT_AMBULATORY_CARE_PROVIDER_SITE_OTHER): Payer: Medicare Other | Admitting: Family Medicine

## 2022-05-30 ENCOUNTER — Ambulatory Visit (INDEPENDENT_AMBULATORY_CARE_PROVIDER_SITE_OTHER)
Admission: RE | Admit: 2022-05-30 | Discharge: 2022-05-30 | Disposition: A | Discharge status: Home / Self Care | Payer: Medicare Other | Source: Ambulatory Visit | Attending: Family Medicine | Admitting: Family Medicine

## 2022-05-30 VITALS — BP 158/88 | HR 90 | Temp 97.9°F | Ht 63.0 in | Wt 123.6 lb

## 2022-05-30 DIAGNOSIS — M898X1 Other specified disorders of bone, shoulder: Secondary | ICD-10-CM

## 2022-05-30 DIAGNOSIS — S42035A Nondisplaced fracture of lateral end of left clavicle, initial encounter for closed fracture: Secondary | ICD-10-CM

## 2022-05-30 DIAGNOSIS — R030 Elevated blood-pressure reading, without diagnosis of hypertension: Secondary | ICD-10-CM | POA: Diagnosis not present

## 2022-05-30 DIAGNOSIS — M85851 Other specified disorders of bone density and structure, right thigh: Secondary | ICD-10-CM | POA: Diagnosis not present

## 2022-05-30 MED ORDER — LINACLOTIDE 290 MCG PO CAPS: 290.0000 ug | ORAL_CAPSULE | Freq: Every day | ORAL | 2 refills | Status: DC

## 2022-05-30 NOTE — Patient Instructions (Addendum)
Left clavicle xray today - consistent with distal fracture.  May increase morphine to 3-4 times daily as needed for the next 1-2 weeks.  I recommend you see the orthopedist - we will refer you back to [facility]  Use sling regularly until you see him.

## 2022-05-30 NOTE — Progress Notes (Unsigned)
Patient ID: Rachel Duran, female    DOB: 1956-05-11, 66 y.o.   MRN: [id number]  This visit was conducted in person.  BP (!) 158/88   Pulse 90   Temp 97.9 F (36.6 C) (Temporal)   Ht '5\' 3"'$  (1.6 m)   Wt 123 lb 9.6 oz (56.1 kg)   LMP 07/25/1998   SpO2 96%   BMI 21.89 kg/m    CC: ER f/u visit  Subjective:   HPI: Rachel Duran is a 66 y.o. female presenting on 05/30/2022 for Hospitalization Follow-up (Seen on 05/27/22 at [facility] ED, fall; closed nondisplaced fx of acromial end of L clavicle.  Pt accompanied by husband, Shanon Brow. )   12/2021 - complex neck surgery by Dr [name] for C5/6 fracture subluxation after fall in prior cervical fusion. Seeing [facility] for persistent cervical radiculopathy after fusion. She is on lyrica '150mg'$  TID - but only takes in general once daily due to dizziness/woozy feeling. She just graduated from physical therapy for neck - balance has been good, has been walking neighborhood.   Upcoming BMD 09/2022.  GERD managed with pantoprazole '40mg'$  BID.  She is also on ursodiol '300mg'$  BID for biliary stasis and linzess movantik and miralax for chronic constipation.   [facility] ER visit on Friday after she suffered fall while walking her dog (another dog charged at them). Diagnosed with sprained shoulder and concern for left lateral clavicle fracture. CT head/neck largely reassuring.   Bruise to left shoulder and upper left thigh.  Orthopedist Dr Marlou Sa and [facility]).   Regularly on morphine '15mg'$  BID (10am and 4pm).      Relevant past medical, surgical, family and social history reviewed and updated as indicated. Interim medical history since our last visit reviewed. Allergies and medications reviewed and updated. Outpatient Medications Prior to Visit  Medication Sig Dispense Refill   ALPRAZolam (XANAX) 1 MG tablet Take 0.5 tablets (0.5 mg total) by mouth at bedtime. 30 tablet 0   amLODipine (NORVASC) 2.5 MG tablet Take 1 tablet (2.5 mg total) by  mouth daily. 90 tablet 0   amLODipine (NORVASC) 5 MG tablet Take 1 tablet (5 mg total) by mouth daily. 90 tablet 0   amphetamine-dextroamphetamine (ADDERALL XR) 30 MG 24 hr capsule TAKE 1 CAPSUEL BY MOUTH EACH MORNING 90 capsule 0   ARIPiprazole (ABILIFY) 5 MG tablet Take 5 mg by mouth at bedtime.     aspirin 81 MG chewable tablet Chew 1 tablet (81 mg total) by mouth daily. 30 tablet 0   Calcium Carbonate-Vitamin D (CALTRATE 600+D PO) Take 2 tablets by mouth daily.     clindamycin (CLEOCIN) 150 MG capsule Take '600mg'$  1 hour prior to dental procedure 10 capsule 0   cyanocobalamin (,VITAMIN B-12,) 1000 MCG/ML injection INJECT 1ML INTO THE MUSCLE EVERY 21 DAYSAS DIRECTED (Patient taking differently: Inject 1,000 mcg into the muscle See admin instructions. Every 21 days) 3 mL 3   Eszopiclone 3 MG TABS TAKE 1 TABLET BY MOUTH AT BEDTIME IMMEDIATLEY AS DIRECTED. 90 tablet 0   ferrous sulfate 325 (65 FE) MG tablet Take 1 tablet (325 mg total) by mouth every Monday, Wednesday, and Friday. 30 tablet 3   lamoTRIgine (LAMICTAL) 200 MG tablet Take 1 tablet (200 mg total) by mouth daily. 30 tablet 0   lidocaine (LIDODERM) 5 % Place 3 patches onto the skin daily. Remove & Discard patch within 12 hours or as directed by MD 60 patch 0   lidocaine (XYLOCAINE) 5 % ointment  Apply 1 Application topically as needed. Apply up to 4x/day- on LUE for nerve pain 50 g 5   linaclotide (LINZESS) 290 MCG CAPS capsule Take 1 capsule (290 mcg total) by mouth daily. 30 capsule 6   morphine (MSIR) 15 MG tablet TAKE 1 TABLET BY MOUTH EVERY 4 HOURS AS NEEDED FOR MODERATE PAIN 90 tablet 0   naloxegol oxalate (MOVANTIK) 12.5 MG TABS tablet Take 1 tablet (12.5 mg total) by mouth daily. 30 tablet 0   NONFORMULARY OR COMPOUNDED ITEM Testosterone proprionate petrolatum (jar) 2% ointment  2 Grams S:  apply a small amount once daily x 5 days per week. 2 each 0   ondansetron (ZOFRAN) 4 MG tablet Take 4 mg by mouth every 8 (eight) hours as  needed for nausea or vomiting.     ondansetron (ZOFRAN-ODT) 4 MG disintegrating tablet Take 4 mg by mouth every 6 (six) hours as needed for nausea or vomiting.     pantoprazole (PROTONIX) 40 MG tablet TAKE ONE TABLET TWICE A DAY WITH MEALS 180 tablet 0   pentosan polysulfate (ELMIRON) 100 MG capsule Take 200 mg by mouth 2 (two) times daily.      polyethylene glycol (MIRALAX / GLYCOLAX) 17 g packet Take 17 g by mouth 2 (two) times daily.     Prasterone (INTRAROSA) 6.5 MG INST Place one per vagina at bedtime. 90 each 0   pregabalin (LYRICA) 150 MG capsule Take 1 capsule (150 mg total) by mouth in the morning, at noon, and at bedtime. 270 capsule 1   ursodiol (ACTIGALL) 300 MG capsule TAKE 1 CAPSULE BY MOUTH TWICE DAILY 180 capsule 3   No facility-administered medications prior to visit.     Per HPI unless specifically indicated in ROS section below Review of Systems  Objective:  BP (!) 158/88   Pulse 90   Temp 97.9 F (36.6 C) (Temporal)   Ht '5\' 3"'$  (1.6 m)   Wt 123 lb 9.6 oz (56.1 kg)   LMP 07/25/1998   SpO2 96%   BMI 21.89 kg/m   Wt Readings from Last 3 Encounters:  05/30/22 123 lb 9.6 oz (56.1 kg)  05/16/22 124 lb 3.2 oz (56.3 kg)  05/04/22 123 lb 2 oz (55.8 kg)      Physical Exam    Results for orders placed or performed in visit on 04/20/22  Sedimentation rate  Result Value Ref Range   Sed Rate 11 0 - 30 mm/hr  CBC with Differential/Platelet  Result Value Ref Range   WBC 7.7 4.0 - 10.5 K/uL   RBC 4.60 3.87 - 5.11 Mil/uL   Hemoglobin 12.5 12.0 - 15.0 g/dL   HCT 38.0 36.0 - 46.0 %   MCV 82.5 78.0 - 100.0 fl   MCHC 33.0 30.0 - 36.0 g/dL   RDW 14.2 11.5 - 15.5 %   Platelets 355.0 150.0 - 400.0 K/uL   Neutrophils Relative % 59.0 43.0 - 77.0 %   Lymphocytes Relative 29.7 12.0 - 46.0 %   Monocytes Relative 8.2 3.0 - 12.0 %   Eosinophils Relative 2.1 0.0 - 5.0 %   Basophils Relative 1.0 0.0 - 3.0 %   Neutro Abs 4.5 1.4 - 7.7 K/uL   Lymphs Abs 2.3 0.7 - 4.0 K/uL    Monocytes Absolute 0.6 0.1 - 1.0 K/uL   Eosinophils Absolute 0.2 0.0 - 0.7 K/uL   Basophils Absolute 0.1 0.0 - 0.1 K/uL  Basic metabolic panel  Result Value Ref Range   Sodium 134 (L)  135 - 145 mEq/L   Potassium 4.6 3.5 - 5.1 mEq/L   Chloride 99 96 - 112 mEq/L   CO2 29 19 - 32 mEq/L   Glucose, Bld 106 (H) 70 - 99 mg/dL   BUN 8 6 - 23 mg/dL   Creatinine, Ser 0.66 0.40 - 1.20 mg/dL   GFR 92.82 >60.00 mL/min   Calcium 9.6 8.4 - 10.5 mg/dL  Sjogren's syndrome antibods(ssa + ssb)  Result Value Ref Range   SSA (Ro) (ENA) Antibody, IgG <1.0 NEG <1.0 NEG AI   SSB (La) (ENA) Antibody, IgG <1.0 NEG <1.0 NEG AI   *Note: Due to a large number of results and/or encounters for the requested time period, some results have not been displayed. A complete set of results can be found in Results Review.   LEFT SHOULDER - 2+ VIEW COMPARISON:  None Available. FINDINGS: There is deformity in the lateral end of left clavicle which may suggest recent or old fracture. Bony spurs are seen in [facility] joint. Rest of the bony structures are unremarkable. There is surgical fusion in the visualized cervical spine. IMPRESSION: The deformity in the lateral end of left clavicle without definite demonstrable radiolucent line. This may suggest residual change from previous injury or recent undisplaced fracture. Please correlate with clinical physical examination findings. Electronically Signed   By: Elmer Picker M.D.   On: 05/27/2022 15:05  Assessment & Plan:   Problem List Items Addressed This Visit   None    No orders of the defined types were placed in this encounter.  No orders of the defined types were placed in this encounter.    Patient Instructions  Left clavicle xray today - consistent with distal fracture.  May increase morphine to 3-4 times daily as needed for the next 1-2 weeks.  I recommend you see the orthopedist - we will refer you back to [facility].  Use sling regularly until you  see him.   Follow up plan: No follow-ups on file.  Ria Bush, MD

## 2022-05-30 NOTE — Telephone Encounter (Addendum)
Pt requests 90-day rx  Linzess Last rx:  03/02/22, #30 Last OV:  04/20/22, sore on toe Next OV:  12/10/26/21, chronic pain f/u

## 2022-05-30 NOTE — Telephone Encounter (Signed)
Seen today. Linzess refilled.

## 2022-05-30 NOTE — Progress Notes (Incomplete)
Patient ID: Rachel Duran, female    DOB: [birth year], 66 y.o.   MRN: [id number]  This visit was conducted in person.  BP (!) 158/88   Pulse 90   Temp 97.9 F (36.6 C) (Temporal)   Ht '5\' 3"'$  (1.6 m)   Wt 123 lb 9.6 oz (56.1 kg)   LMP 07/25/1998   SpO2 96%   BMI 21.89 kg/m    CC: ER f/u visit  Subjective:   HPI: Rachel Duran is a 66 y.o. female presenting on 05/30/2022 for Hospitalization Follow-up (Seen on 05/27/22 at [facility] ED, fall; closed nondisplaced fx of acromial end of L clavicle.  Pt accompanied by husband, Shanon Brow. )   12/2021 - complex neck surgery by Dr [name] for C5/6 fracture subluxation after fall in prior cervical fusion. Seeing [facility] for persistent cervical radiculopathy after fusion. She is on lyrica '150mg'$  TID - but only takes in general once daily due to dizziness/woozy feeling. She just graduated from physical therapy for neck - balance has been good, has been walking neighborhood.   [facility] ER visit on Friday after she suffered fall while walking her dog (another dog charged at them). Diagnosed with sprained shoulder and concern for left lateral clavicle fracture. CT head/neck largely reassuring.   Bruise to left shoulder and upper left thigh.  Orthopedist Dr Marlou Sa and [facility]).   Regularly on morphine '15mg'$  BID (10am and 4pm).   Upcoming BMD 09/2022.  GERD managed with pantoprazole '40mg'$  BID.  She is also on ursodiol '300mg'$  BID for biliary stasis and linzess movantik and miralax for chronic constipation.      Relevant past medical, surgical, family and social history reviewed and updated as indicated. Interim medical history since our last visit reviewed. Allergies and medications reviewed and updated. Outpatient Medications Prior to Visit  Medication Sig Dispense Refill  . ALPRAZolam (XANAX) 1 MG tablet Take 0.5 tablets (0.5 mg total) by mouth at bedtime. 30 tablet 0  . amLODipine (NORVASC) 2.5 MG tablet Take 1 tablet (2.5 mg total) by  mouth daily. 90 tablet 0  . amLODipine (NORVASC) 5 MG tablet Take 1 tablet (5 mg total) by mouth daily. 90 tablet 0  . amphetamine-dextroamphetamine (ADDERALL XR) 30 MG 24 hr capsule TAKE 1 CAPSUEL BY MOUTH EACH MORNING 90 capsule 0  . ARIPiprazole (ABILIFY) 5 MG tablet Take 5 mg by mouth at bedtime.    Marland Kitchen aspirin 81 MG chewable tablet Chew 1 tablet (81 mg total) by mouth daily. 30 tablet 0  . Calcium Carbonate-Vitamin D (CALTRATE 600+D PO) Take 2 tablets by mouth daily.    . clindamycin (CLEOCIN) 150 MG capsule Take '600mg'$  1 hour prior to dental procedure 10 capsule 0  . cyanocobalamin (,VITAMIN B-12,) 1000 MCG/ML injection INJECT 1ML INTO THE MUSCLE EVERY 21 DAYSAS DIRECTED (Patient taking differently: Inject 1,000 mcg into the muscle See admin instructions. Every 21 days) 3 mL 3  . Eszopiclone 3 MG TABS TAKE 1 TABLET BY MOUTH AT BEDTIME IMMEDIATLEY AS DIRECTED. 90 tablet 0  . ferrous sulfate 325 (65 FE) MG tablet Take 1 tablet (325 mg total) by mouth every Monday, Wednesday, and Friday. 30 tablet 3  . lamoTRIgine (LAMICTAL) 200 MG tablet Take 1 tablet (200 mg total) by mouth daily. 30 tablet 0  . lidocaine (LIDODERM) 5 % Place 3 patches onto the skin daily. Remove & Discard patch within 12 hours or as directed by MD 60 patch 0  . lidocaine (XYLOCAINE) 5 % ointment  Apply 1 Application topically as needed. Apply up to 4x/day- on LUE for nerve pain 50 g 5  . morphine (MSIR) 15 MG tablet TAKE 1 TABLET BY MOUTH EVERY 4 HOURS AS NEEDED FOR MODERATE PAIN 90 tablet 0  . naloxegol oxalate (MOVANTIK) 12.5 MG TABS tablet Take 1 tablet (12.5 mg total) by mouth daily. 30 tablet 0  . NONFORMULARY OR COMPOUNDED ITEM Testosterone proprionate petrolatum (jar) 2% ointment  2 Grams S:  apply a small amount once daily x 5 days per week. 2 each 0  . ondansetron (ZOFRAN) 4 MG tablet Take 4 mg by mouth every 8 (eight) hours as needed for nausea or vomiting.    . ondansetron (ZOFRAN-ODT) 4 MG disintegrating tablet Take 4  mg by mouth every 6 (six) hours as needed for nausea or vomiting.    . pantoprazole (PROTONIX) 40 MG tablet TAKE ONE TABLET TWICE A DAY WITH MEALS 180 tablet 0  . pentosan polysulfate (ELMIRON) 100 MG capsule Take 200 mg by mouth 2 (two) times daily.     . polyethylene glycol (MIRALAX / GLYCOLAX) 17 g packet Take 17 g by mouth 2 (two) times daily.    . Prasterone (INTRAROSA) 6.5 MG INST Place one per vagina at bedtime. 90 each 0  . pregabalin (LYRICA) 150 MG capsule Take 1 capsule (150 mg total) by mouth in the morning, at noon, and at bedtime. 270 capsule 1  . ursodiol (ACTIGALL) 300 MG capsule TAKE 1 CAPSULE BY MOUTH TWICE DAILY 180 capsule 3  . linaclotide (LINZESS) 290 MCG CAPS capsule Take 1 capsule (290 mcg total) by mouth daily. 30 capsule 6   No facility-administered medications prior to visit.     Per HPI unless specifically indicated in ROS section below Review of Systems  Objective:  BP (!) 158/88   Pulse 90   Temp 97.9 F (36.6 C) (Temporal)   Ht '5\' 3"'$  (1.6 m)   Wt 123 lb 9.6 oz (56.1 kg)   LMP 07/25/1998   SpO2 96%   BMI 21.89 kg/m   Wt Readings from Last 3 Encounters:  05/30/22 123 lb 9.6 oz (56.1 kg)  05/16/22 124 lb 3.2 oz (56.3 kg)  05/04/22 123 lb 2 oz (55.8 kg)      Physical Exam Vitals and nursing note reviewed.  Constitutional:      Appearance: Normal appearance. She is not ill-appearing.     Comments: Left arm in sling  Cardiovascular:     Rate and Rhythm: Normal rate and regular rhythm.     Pulses: Normal pulses.     Heart sounds: Normal heart sounds. No murmur heard. Pulmonary:     Effort: Pulmonary effort is normal. No respiratory distress.     Breath sounds: Normal breath sounds. No wheezing, rhonchi or rales.  Musculoskeletal:        General: Tenderness present.     Cervical back: Normal range of motion and neck supple.     Comments:  Bruising overlying mid to distal left clavicle Point tender to palpation at this area.  Limited ROM of left  shoulder due to pain.   Skin:    General: Skin is warm and dry.     Findings: Bruising present. No rash.  Neurological:     Mental Status: She is alert.  Psychiatric:        Mood and Affect: Mood normal.        Behavior: Behavior normal.       LEFT SHOULDER - 2+  VIEW COMPARISON:  None Available. FINDINGS: There is deformity in the lateral end of left clavicle which may suggest recent or old fracture. Bony spurs are seen in [facility] joint. Rest of the bony structures are unremarkable. There is surgical fusion in the visualized cervical spine. IMPRESSION: The deformity in the lateral end of left clavicle without definite demonstrable radiolucent line. This may suggest residual change from previous injury or recent undisplaced fracture. Please correlate with clinical physical examination findings. Electronically Signed   By: Elmer Picker M.D.   On: 05/27/2022 15:05  Assessment & Plan:   Problem List Items Addressed This Visit     Osteopenia   Closed nondisplaced fracture of lateral end of left clavicle - Primary    Question of this after recent fall, unclear on L shoulder xray.  Significant pain, concern for distal clavicle fracture - will update clavicle xray today - consistent with fracture. Rec start sling use, discussed pain management - increase MSIR '15mg'$  to 3-4 times daily as needed. Will refer to ortho eval/management.       Relevant Orders   Ambulatory referral to Orthopedic Surgery   Other Visit Diagnoses     Pain of left clavicle       Relevant Orders   DG Clavicle Left        No orders of the defined types were placed in this encounter.  Orders Placed This Encounter  Procedures  . DG Clavicle Left    Standing Status:   Future    Number of Occurrences:   1    Standing Expiration Date:   05/31/2023    Order Specific Question:   Reason for Exam (SYMPTOM  OR DIAGNOSIS REQUIRED)    Answer:   fall with residual left distal clavicle pain, bruising     Order Specific Question:   Preferred imaging location?    Answer:   [facility]  . Ambulatory referral to Orthopedic Surgery    Referral Priority:   Routine    Referral Type:   Surgical    Referral Reason:   Specialty Services Required    Requested Specialty:   Orthopedic Surgery    Number of Visits Requested:   1     Patient Instructions  Left clavicle xray today - consistent with distal fracture.  May increase morphine to 3-4 times daily as needed for the next 1-2 weeks.  I recommend you see the orthopedist - we will refer you back to [facility].  Use sling regularly until you see him.   Follow up plan: Return if symptoms worsen or fail to improve.  Ria Bush, MD

## 2022-05-30 NOTE — Assessment & Plan Note (Signed)
Question of this after recent fall, unclear on L shoulder xray.  Significant pain, concern for distal clavicle fracture - will update clavicle xray today - consistent with fracture. Rec start sling use, discussed pain management - increase MSIR '15mg'$  to 3-4 times daily as needed. Will refer to ortho eval/management.

## 2022-05-31 ENCOUNTER — Encounter: Payer: Self-pay | Admitting: Obstetrics and Gynecology

## 2022-05-31 ENCOUNTER — Other Ambulatory Visit: Payer: Self-pay | Admitting: Family Medicine

## 2022-05-31 ENCOUNTER — Ambulatory Visit: Payer: Medicare Other | Admitting: Physical Therapy

## 2022-05-31 DIAGNOSIS — R030 Elevated blood-pressure reading, without diagnosis of hypertension: Secondary | ICD-10-CM | POA: Insufficient documentation

## 2022-05-31 NOTE — Assessment & Plan Note (Signed)
Known osteopenia followed by GYN with planned rpt DEXA 09/2022

## 2022-05-31 NOTE — Assessment & Plan Note (Signed)
BP elevated due to pain. No med changes indicated.  She is on amlodipine for [name] through rheum

## 2022-06-01 NOTE — Telephone Encounter (Signed)
Zofran Last filled:  12/22/21 Last OV:  05/30/22, ER f/u Next OV:  06/27/22, 3 mo chronic pain f/u

## 2022-06-03 ENCOUNTER — Ambulatory Visit (INDEPENDENT_AMBULATORY_CARE_PROVIDER_SITE_OTHER): Payer: Medicare Other

## 2022-06-03 ENCOUNTER — Encounter: Payer: Self-pay | Admitting: Orthopedic Surgery

## 2022-06-03 ENCOUNTER — Ambulatory Visit (INDEPENDENT_AMBULATORY_CARE_PROVIDER_SITE_OTHER): Payer: Medicare Other | Admitting: Surgical

## 2022-06-03 DIAGNOSIS — S42032A Displaced fracture of lateral end of left clavicle, initial encounter for closed fracture: Secondary | ICD-10-CM | POA: Diagnosis not present

## 2022-06-03 DIAGNOSIS — S8991XA Unspecified injury of right lower leg, initial encounter: Secondary | ICD-10-CM

## 2022-06-03 NOTE — Progress Notes (Signed)
Office Visit Note   Patient: Rachel Duran           Date of Birth: 29-Mar-1956           MRN: [id number] Visit Date: 06/03/2022 Requested by: Ria Bush, MD [address] [place],  [place] [address] PCP: Ria Bush, MD  Subjective: Chief Complaint  Patient presents with   Left Shoulder - Pain   Right Knee - Pain    HPI: Rachel Duran is a 66 y.o. female who presents to the office reporting left shoulder pain.  Patient states that she injured her left shoulder on 05/27/2022.  She was walking her dogs with her husband when she rounded a corner and an aggressive dog from one of the neighbors in the neighborhood ran up to sternal at her dogs causing leashes to get tangled which caused her to fall onto her left side.  She was taken to the ER where radiographs demonstrated clavicle fracture.  This is the second major fall that she has had this year with a prior fall in the summertime that caused C-spine fracture that necessitated surgery by Dr. [name].  She has done fairly well with this surgery aside from some chronic pain/numbness/tingling in the left arm since that injury.  She has had no prior injury or surgery to the left shoulder.  She notes she had bruising at the time of injury and swelling.  No blood thinner use.  She takes vitamin D every day.  She does not smoke and she has no history of nonunion.  Pain is significantly improving compared with initial injury date.  She also notes that she fell onto her left and right knees with an abrasion over the proximal aspect of the anterior tibia.  She has history of right patellofemoral arthroplasty by Dr. Marlou Sa in 2021.  She denies any dislocation of the patellofemoral joint.  No new mechanical symptoms.  Just a little sore.  No difficulty moving her knee..                ROS: All systems reviewed are negative as they relate to the chief complaint within the history of present illness.  Patient denies fevers or  chills.  Assessment & Plan: Visit Diagnoses:  1. Displaced fracture of lateral end of left clavicle, initial encounter for closed fracture   2. Right knee injury, initial encounter     Plan: Patient is a 66 year old female who presents for evaluation of left clavicle fracture and right knee pain.  She has left clavicle fracture in the distal aspect of the clavicle with mild amount of displacement.  With the mild amount of displacement present, it looks like this would do well with nonoperative management.  Plan to continue with sling immobilization.  She has no history of nonunion and is taking vitamin D every day which I encouraged her to continue.  She may come out of the sling couple times a day to work on the stiffness in her elbow.  She will refrain from actively lifting the shoulder and lifting anything with the injured extremity.  Follow-up in 14 days for clinical recheck with Dr. Marlou Sa and new radiographs at that time to ensure no continued displacement.  If it feels stable at that point, we may discontinue the sling.  Regarding her right knee, she has right knee radiographs taken today demonstrating right knee patellofemoral arthroplasty components in good position with no evidence of periprosthetic fracture or loosening.  She has no  effusion or any significant discomfort with knee range of motion or quadricep strength testing.  No further intervention needed for the right knee at this time.  Follow-Up Instructions: No follow-ups on file.   Orders:  Orders Placed This Encounter  Procedures   XR KNEE 3 VIEW RIGHT   No orders of the defined types were placed in this encounter.     Procedures: No procedures performed   Clinical Data: No additional findings.  Objective: Vital Signs: LMP 07/25/1998   Physical Exam:  Constitutional: Patient appears well-developed HEENT:  Head: Normocephalic Eyes:EOM are normal Neck: Normal range of motion Cardiovascular: Normal  rate Pulmonary/chest: Effort normal Neurologic: Patient is alert Skin: Skin is warm Psychiatric: Patient has normal mood and affect  Ortho Exam: Ortho exam demonstrates left shoulder with 2+ radial pulse of the left upper extremity.  Axillary nerve is intact with deltoid firing of the left shoulder.  She has swelling and bruising that is noted primarily over the [facility] joint and the anterior aspect of the clavicle.  She has tenderness over the distal clavicle and the clavicular shaft.  No tenderness over the acromion.  She has excellent rotator cuff strength of supra, infra, subscap rated 5/5.  No tenderness throughout the axial cervical spine.  Mild tenderness throughout the scapular spine but she denies any new pain in this region.  She has 5/5 EPL, FPL, grip strength, pronation/supination, bicep, tricep, deltoid strength.  No bruising noted in the arm itself.  Right knee exam demonstrates well-healed incision from prior patellofemoral arthroplasty.  No effusion noted.  No tenderness over the patella.  No tenderness over the patellar tendon or quadricep tendon.  She has excellent quadricep strength rated 5/5.  Range of motion from about 3 degrees extension to 120 degrees of knee flexion.  She has no cellulitis or skin changes noted.  No skin injury noted.  No tenderness over the medial or lateral joint lines.  No tenderness over the medial or lateral femoral condyle.  Specialty Comments:  No specialty comments available.  Imaging: No results found.   PMFS History: Patient Active Problem List   Diagnosis Date Noted   Elevated blood pressure reading in office without diagnosis of hypertension 05/31/2022   Closed nondisplaced fracture of lateral end of left clavicle 05/30/2022   Sore on toe 04/21/2022   Fusion of spine, cervical region    Anemia    Primary hypertension    Cervical spine arthritis with nerve pain 12/29/2021   C6 cervical fracture ([place]) 12/29/2021   C5 cervical fracture ([place])  12/21/2021   Advanced directives, counseling/discussion 09/23/2021   Swelling of right index finger 07/14/2021   Synovitis of right knee    Scoliosis 01/13/2021   Osteopenia 07/02/2020   Status post left knee surgery 06/01/2020   Pulmonary nodule 05/27/2020   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   Chronic patellofemoral pain of right knee 05/05/2020   Positive ANA (antinuclear antibody) 06/05/2019   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   Unilateral primary osteoarthritis, left hip 01/28/2019   Pelvic pain 12/12/2018   Common bile duct (CBD) obstruction    Venous insufficiency of left lower extremity 07/04/2018   Dyslipidemia 09/22/2017   DDD (degenerative disc disease), cervical 04/03/2017   DDD (degenerative disc disease), lumbar 04/03/2017   Onychomycosis 03/21/2017   Dysphagia 10/18/2016   Encounter for chronic pain management 10/18/2016   Biliary stasis 09/26/2016   Dry mouth 06/06/2016   HNP (herniated nucleus pulposus), lumbar 09/23/2015  Iron deficiency 07/30/2015   Health maintenance examination 06/15/2015   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   Clavicle enlargement 12/13/2014   Prediabetes 12/13/2014   Plantar fasciitis, bilateral 09/11/2014   Medicare annual wellness visit, subsequent 06/10/2014   Abnormal thyroid function test 06/10/2014   Chronic pain syndrome 06/10/2014   CFS (chronic fatigue syndrome) 04/08/2014   Postmenopausal atrophic vaginitis 10/19/2012   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   Cervical disc disorder with radiculopathy of cervical region 05/28/2010   APHTHOUS ULCERS 01/31/2008   Insomnia 11/09/2007   Constipation 10/11/2007   Allergic rhinitis 04/12/2007   MDD (major depressive disorder), recurrent episode, moderate ([place]) 03/05/2007   [name] syndrome 03/05/2007   GERD 03/05/2007   ROSACEA 03/05/2007   NEURALGIA 03/05/2007   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 C6- C7 -hx. past fusion(bone graft used)   Cholecystitis    Chronic pain    DDD (degenerative disc disease), lumbar 09/2015   dextroscoliosis with multilevel DDD and facet arthrosis most notable for R foraminal disc protrusion L4/5 producing severe R neural foraminal stenosis abutting R L4 nerve root, moderate spinal canal and mild lat recesss and R neural foraminal stenosis L3/4 (MRI)   Depression    bipolar depression   Disorders of porphyrin metabolism    Felon of finger of left hand 11/10/2016   Fibromyalgia    GERD (gastroesophageal reflux disease)    Headache    occasionally   Internal hemorrhoids    Interstitial cystitis 06/06/2012   hx.   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 (Dr Baird Cancer retinologist)   Tuberculosis     Family History  Problem Relation Age of Onset   CAD Father 73       MI, nonsmoker   Esophageal cancer Father 3   Stomach cancer Father    Scleroderma Mother    Hypertension Mother    Esophageal cancer Paternal Grandfather    Stomach cancer Paternal Grandfather    Diabetes Maternal Grandmother    Arthritis Brother    Stroke Neg Hx    Colon cancer Neg Hx    Rectal cancer Neg Hx     Past Surgical History:  Procedure Laterality  Date   ANTERIOR CERVICAL DECOMP/DISCECTOMY FUSION  2004   C5/6, C6/7   ANTERIOR CERVICAL DECOMP/DISCECTOMY FUSION  02/2016   C3/4, C4/5 with plating [name])   ANTERIOR CERVICAL DECOMP/DISCECTOMY FUSION  12/29/2021   Procedure: ANTERIOR CERVICAL DECOMPRESSION/ DISCECTOMY FUSION CERVICAL THREE - SEVEN;  Surgeon: Newman Pies, MD;  Location: [place];  Service: Neurosurgery;;   ANTERIOR CERVICAL DECOMPRESSION/DISCECTOMY FUSION 4 LEVELS  N/A 12/29/2021   Procedure: CERVICAL FIVE-SIX CORPECTOMY,;  Surgeon: Newman Pies, MD;  Location: [place];  Service: Neurosurgery;  Laterality: N/A;   AUGMENTATION MAMMAPLASTY Bilateral 03/25/2010   BREAST ENHANCEMENT SURGERY  2010   BREAST IMPLANT EXCHANGE  10/2014   exchange saline implants, B mastopexy/capsulorraphy ([facility])   BUNIONECTOMY Bilateral yrs ago   [place]   x 1   CHOLECYSTECTOMY  06/11/2012   Procedure: LAPAROSCOPIC CHOLECYSTECTOMY WITH INTRAOPERATIVE CHOLANGIOGRAM;  Surgeon: [name], MD,FACS;  [facility];  Service: General;  Laterality: N/A;   COLONOSCOPY  02/2018   done for positive cologuard - WNL, rpt 10 yrs Fuller Plan)   CYSTOSCOPY     ERCP  05/22/2012   Procedure: ENDOSCOPIC RETROGRADE CHOLANGIOPANCREATOGRAPHY (ERCP);  Surgeon: [name], MD,FACG;  [facility];  Service: Endoscopy;  Laterality: N/A;   ERCP N/A 09/17/2013   Procedure: ENDOSCOPIC RETROGRADE CHOLANGIOPANCREATOGRAPHY (ERCP);  Surgeon: [name], MD;  Location: [facility];  Service: Endoscopy;  Laterality: N/A;   ERCP N/A 09/27/2018   Procedure: ENDOSCOPIC RETROGRADE CHOLANGIOPANCREATOGRAPHY (ERCP);  Surgeon: [name], MD;  Location: [facility];  Service: Endoscopy;  Laterality: N/A;   ESOPHAGOGASTRODUODENOSCOPY  09/2016   WNL. esophagus dilated Fuller Plan)   HEMORRHOID BANDING  09/23/2013   --Dr. Greer Pickerel   HERNIA REPAIR     inguinal   HYSTEROSCOPY W/ ENDOMETRIAL ABLATION     KNEE ARTHROSCOPY Right  04/06/2021   Procedure: RIGHT KNEE ARTHROSCOPY WITH DEBRIDEMENT;  Surgeon: [name], MD;  Location: [place];  Service: Orthopedics;  Laterality: Right;   NASAL SINUS SURGERY     x5   PARTIAL KNEE ARTHROPLASTY Right 06/01/2020   Procedure: Right knee patellofemoral replacement;  Surgeon: [name], MD;  Location: [place];  Service: Orthopedics;  Laterality: Right;   POSTERIOR CERVICAL FUSION/FORAMINOTOMY N/A 12/29/2021   Procedure: POSTERIOR CERVICAL FUSION CERVICAL THREE-SEVEN;  Surgeon: Newman Pies, MD;  Location: [place];  Service: Neurosurgery;  Laterality: N/A;   REMOVAL OF STONES  09/27/2018   Procedure: REMOVAL OF STONES;  Surgeon: [name], MD;  [facility];  Service: Endoscopy;;   SPHINCTEROTOMY  09/27/2018   Procedure: SPHINCTEROTOMY;  Surgeon: [name], MD;  [facility];  Service: Endoscopy;;   TONSILLECTOMY     removed as a child   TOTAL HIP ARTHROPLASTY Left 03/12/2019   Procedure: LEFT TOTAL HIP ARTHROPLASTY ANTERIOR APPROACH;  [name], [name], MD)   UPPER GASTROINTESTINAL ENDOSCOPY     Social History   Occupational History   Occupation: retired    Fish farm manager: UNEMPLOYED  Tobacco Use   Smoking status: Never    Passive exposure: Never   Smokeless tobacco: Never  Vaping Use   Vaping Use: Never used  Substance and Sexual Activity   Alcohol use: No    Alcohol/week: 0.0 standard drinks of alcohol   Drug use: No   Sexual activity: Yes    Partners: Male    Birth control/protection: Post-menopausal    Comment: vasectomy, 1st intercourse- 86 y.o., partner- 1, DES exposure unknown

## 2022-06-06 ENCOUNTER — Other Ambulatory Visit: Payer: Self-pay | Admitting: Family Medicine

## 2022-06-06 ENCOUNTER — Ambulatory Visit: Payer: Medicare Other | Admitting: Physical Therapy

## 2022-06-06 NOTE — Telephone Encounter (Signed)
Refill request Morphine Last refill 04/30/22 #90 Last office visit 05/30/22 Upcoming appointment 06/27/22

## 2022-06-07 ENCOUNTER — Encounter: Payer: Self-pay | Admitting: Family Medicine

## 2022-06-07 ENCOUNTER — Other Ambulatory Visit: Payer: Self-pay | Admitting: Family Medicine

## 2022-06-07 NOTE — Telephone Encounter (Signed)
See refill request from 06/06/22

## 2022-06-07 NOTE — Telephone Encounter (Signed)
ERx 

## 2022-06-07 NOTE — Telephone Encounter (Signed)
Movantik Last filled:  03/03/22, #30 Last OV:  05/30/22, ER f/u Next OV:  06/27/22, 3 mo chronic pain f/u

## 2022-06-13 ENCOUNTER — Ambulatory Visit: Payer: Medicare Other | Admitting: Physical Therapy

## 2022-06-20 ENCOUNTER — Other Ambulatory Visit: Payer: Self-pay | Admitting: Physician Assistant

## 2022-06-22 ENCOUNTER — Ambulatory Visit (INDEPENDENT_AMBULATORY_CARE_PROVIDER_SITE_OTHER): Payer: Medicare Other | Admitting: Orthopedic Surgery

## 2022-06-22 ENCOUNTER — Telehealth: Payer: Self-pay | Admitting: Gastroenterology

## 2022-06-22 ENCOUNTER — Ambulatory Visit (INDEPENDENT_AMBULATORY_CARE_PROVIDER_SITE_OTHER): Payer: Medicare Other

## 2022-06-22 DIAGNOSIS — S42032A Displaced fracture of lateral end of left clavicle, initial encounter for closed fracture: Secondary | ICD-10-CM

## 2022-06-22 MED ORDER — PANTOPRAZOLE SODIUM 40 MG PO TBEC: DELAYED_RELEASE_TABLET | ORAL | 3 refills | Status: DC

## 2022-06-22 NOTE — Telephone Encounter (Signed)
Patient called requesting a refill on Pantoprazole for a 90 day supply.

## 2022-06-22 NOTE — Telephone Encounter (Signed)
Prescription for pantoprazole has been sent to patient's pharmacy per patient's request.

## 2022-06-24 ENCOUNTER — Ambulatory Visit: Payer: Medicare Other | Admitting: Physical Medicine and Rehabilitation

## 2022-06-24 NOTE — Progress Notes (Unsigned)
Post-Op Visit Note   Patient: Rachel Duran           Date of Birth: 1956-07-11           MRN: [id number] Visit Date: 06/22/2022 PCP: Ria Bush, MD   Assessment & Plan:  Chief Complaint:  Chief Complaint  Patient presents with   Other    Follow up left clavicle fx   Visit Diagnoses:  1. Displaced fracture of lateral end of left clavicle, initial encounter for closed fracture     Plan: Rachel Duran is a 66 year old patient with left clavicle fracture lateral end.  Date of injury 06/23/2022.  The clavicle feels less painful than it did.  Radiographs show no change in fracture alignment with some callus formation noted anteriorly and superiorly.  She does have a history of delayed union with her neck fusions.  Plan at this time is to continue use of the sling but start doing some range of motion exercises.  Come back in 3 weeks for repeat radiographs and clinical examination and possible discharge at that time.  Follow-Up Instructions: No follow-ups on file.   Orders:  Orders Placed This Encounter  Procedures   XR Clavicle Left   No orders of the defined types were placed in this encounter.   Imaging: No results found.  PMFS History: Patient Active Problem List   Diagnosis Date Noted   Elevated blood pressure reading in office without diagnosis of hypertension 05/31/2022   Closed nondisplaced fracture of lateral end of left clavicle 05/30/2022   Sore on toe 04/21/2022   Fusion of spine, cervical region    Anemia    Primary hypertension    Cervical spine arthritis with nerve pain 12/29/2021   C6 cervical fracture ([place]) 12/29/2021   C5 cervical fracture ([place]) 12/21/2021   Advanced directives, counseling/discussion 09/23/2021   Swelling of right index finger 07/14/2021   Synovitis of right knee    Scoliosis 01/13/2021   Osteopenia 07/02/2020   Status post left knee surgery 06/01/2020   Pulmonary nodule 05/27/2020   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[place])  05/05/2020   Chronic patellofemoral pain of right knee 05/05/2020   Positive ANA (antinuclear antibody) 06/05/2019   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   Unilateral primary osteoarthritis, left hip 01/28/2019   Pelvic pain 12/12/2018   Common bile duct (CBD) obstruction    Venous insufficiency of left lower extremity 07/04/2018   Dyslipidemia 09/22/2017   DDD (degenerative disc disease), cervical 04/03/2017   DDD (degenerative disc disease), lumbar 04/03/2017   Onychomycosis 03/21/2017   Dysphagia 10/18/2016   Encounter for chronic pain management 10/18/2016   Biliary stasis 09/26/2016   Dry mouth 06/06/2016   HNP (herniated nucleus pulposus), lumbar 09/23/2015   Iron deficiency 07/30/2015   Health maintenance examination 06/15/2015   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   Clavicle enlargement 12/13/2014   Prediabetes 12/13/2014   Plantar fasciitis, bilateral 09/11/2014   Medicare annual wellness visit, subsequent 06/10/2014   Abnormal thyroid function test 06/10/2014   Chronic pain syndrome 06/10/2014   CFS (chronic fatigue syndrome) 04/08/2014   Postmenopausal atrophic vaginitis 10/19/2012   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   Cervical disc disorder with radiculopathy of cervical region 05/28/2010   APHTHOUS ULCERS 01/31/2008   Insomnia 11/09/2007   Constipation 10/11/2007   Allergic rhinitis 04/12/2007   MDD (major depressive disorder), recurrent episode, moderate ([place]) 03/05/2007   [name] syndrome 03/05/2007   GERD 03/05/2007   ROSACEA 03/05/2007   NEURALGIA 03/05/2007  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 C6-  C7 -hx. past fusion(bone graft used)   Cholecystitis    Chronic pain    DDD (degenerative disc disease), lumbar 09/2015   dextroscoliosis with multilevel DDD and facet arthrosis most notable for R foraminal disc protrusion L4/5 producing severe R neural foraminal stenosis abutting R L4 nerve root, moderate spinal canal and mild lat recesss and R neural foraminal stenosis L3/4 (MRI)   Depression    bipolar depression   Disorders of porphyrin metabolism    Felon of finger of left hand 11/10/2016   Fibromyalgia    GERD (gastroesophageal reflux disease)    Headache    occasionally   Internal hemorrhoids    Interstitial cystitis 06/06/2012   hx.   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   Scleroderma ([place]) 04/24/2020   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 (Dr Baird Cancer retinologist)   Tuberculosis     Family History  Problem Relation Age of Onset   CAD Father 16       MI, nonsmoker   Esophageal cancer Father 45   Stomach cancer Father    Scleroderma Mother    Hypertension Mother    Esophageal cancer Paternal Grandfather    Stomach cancer Paternal Grandfather    Diabetes Maternal Grandmother    Arthritis Brother    Stroke Neg Hx    Colon cancer Neg Hx    Rectal cancer Neg Hx     Past Surgical History:  Procedure Laterality Date   ANTERIOR CERVICAL DECOMP/DISCECTOMY FUSION  2004   C5/6, C6/7   ANTERIOR CERVICAL DECOMP/DISCECTOMY FUSION  02/2016   C3/4, C4/5 with plating [name])   ANTERIOR CERVICAL DECOMP/DISCECTOMY FUSION  12/29/2021   Procedure: ANTERIOR CERVICAL DECOMPRESSION/ DISCECTOMY FUSION CERVICAL THREE -  SEVEN;  Surgeon: Newman Pies, MD;  Location: [place];  Service: Neurosurgery;;   ANTERIOR CERVICAL DECOMPRESSION/DISCECTOMY FUSION 4 LEVELS N/A 12/29/2021   Procedure: CERVICAL FIVE-SIX CORPECTOMY,;  Surgeon: Newman Pies, MD;  Location: [place];  Service: Neurosurgery;  Laterality: N/A;   AUGMENTATION MAMMAPLASTY Bilateral 03/25/2010   BREAST ENHANCEMENT SURGERY  2010   BREAST IMPLANT EXCHANGE  10/2014   exchange saline implants, B mastopexy/capsulorraphy ([facility])   BUNIONECTOMY Bilateral yrs ago   [place]   x 1   CHOLECYSTECTOMY  06/11/2012   Procedure: LAPAROSCOPIC CHOLECYSTECTOMY WITH INTRAOPERATIVE CHOLANGIOGRAM;  Surgeon: [name], MD,FACS;  [facility];  Service: General;  Laterality: N/A;   COLONOSCOPY  02/2018   done for positive cologuard - WNL, rpt 10 yrs ([place])   CYSTOSCOPY     ERCP  05/22/2012   Procedure: ENDOSCOPIC RETROGRADE  CHOLANGIOPANCREATOGRAPHY (ERCP);  Surgeon: [name], MD,FACG;  Location[facility];  Service: Endoscopy;  Laterality: N/A;   ERCP N/A 09/17/2013   Procedure: ENDOSCOPIC RETROGRADE CHOLANGIOPANCREATOGRAPHY (ERCP);  Surgeon: [name], MD;  Location[facility];  Service: Endoscopy;  Laterality: N/A;   ERCP N/A 09/27/2018   Procedure: ENDOSCOPIC RETROGRADE CHOLANGIOPANCREATOGRAPHY (ERCP);  Surgeon: [name], MD;  Location[facility];  Service: Endoscopy;  Laterality: N/A;   ESOPHAGOGASTRODUODENOSCOPY  09/2016   WNL. esophagus dilated Fuller Plan)   HEMORRHOID BANDING  09/23/2013   --Dr. Greer Pickerel   HERNIA REPAIR     inguinal   HYSTEROSCOPY W/ ENDOMETRIAL ABLATION     KNEE ARTHROSCOPY Right 04/06/2021   Procedure: RIGHT KNEE ARTHROSCOPY WITH DEBRIDEMENT;  Surgeon: [name], MD;  Location: [place];  Service: Orthopedics;  Laterality: Right;   NASAL SINUS SURGERY     x5   PARTIAL KNEE ARTHROPLASTY Right 06/01/2020   Procedure: Right knee patellofemoral  replacement;  Surgeon: [name], MD;  Location: [place];  Service: Orthopedics;  Laterality: Right;   POSTERIOR CERVICAL FUSION/FORAMINOTOMY N/A 12/29/2021   Procedure: POSTERIOR CERVICAL FUSION CERVICAL THREE-SEVEN;  Surgeon: Newman Pies, MD;  Location: [place];  Service: Neurosurgery;  Laterality: N/A;   REMOVAL OF STONES  09/27/2018   Procedure: REMOVAL OF STONES;  Surgeon: [name], MD;  [facility];  Service: Endoscopy;;   SPHINCTEROTOMY  09/27/2018   Procedure: SPHINCTEROTOMY;  Surgeon: [name], MD;  [facility];  Service: Endoscopy;;   TONSILLECTOMY     removed as a child   TOTAL HIP ARTHROPLASTY Left 03/12/2019   Procedure: LEFT TOTAL HIP ARTHROPLASTY ANTERIOR APPROACH;  [name], [name], MD)   UPPER GASTROINTESTINAL ENDOSCOPY     Social History   Occupational History   Occupation: retired    Fish farm manager: UNEMPLOYED  Tobacco Use   Smoking status: Never    Passive exposure: Never   Smokeless tobacco: Never  Vaping Use   Vaping Use: Never used  Substance and Sexual Activity   Alcohol use: No    Alcohol/week: 0.0 standard drinks of alcohol   Drug use: No   Sexual activity: Yes    Partners: Male    Birth control/protection: Post-menopausal    Comment: vasectomy, 1st intercourse- 3 y.o., partner- 1, DES exposure unknown

## 2022-06-25 ENCOUNTER — Encounter: Payer: Self-pay | Admitting: Orthopedic Surgery

## 2022-06-27 ENCOUNTER — Ambulatory Visit (INDEPENDENT_AMBULATORY_CARE_PROVIDER_SITE_OTHER): Payer: Medicare Other | Admitting: Family Medicine

## 2022-06-27 ENCOUNTER — Encounter: Payer: Self-pay | Admitting: Family Medicine

## 2022-06-27 VITALS — BP 136/84 | HR 108 | Temp 98.2°F | Ht 63.0 in | Wt 119.8 lb

## 2022-06-27 DIAGNOSIS — J029 Acute pharyngitis, unspecified: Secondary | ICD-10-CM

## 2022-06-27 DIAGNOSIS — J020 Streptococcal pharyngitis: Secondary | ICD-10-CM | POA: Diagnosis not present

## 2022-06-27 DIAGNOSIS — G8929 Other chronic pain: Secondary | ICD-10-CM | POA: Diagnosis not present

## 2022-06-27 DIAGNOSIS — G894 Chronic pain syndrome: Secondary | ICD-10-CM | POA: Diagnosis not present

## 2022-06-27 DIAGNOSIS — F4321 Adjustment disorder with depressed mood: Secondary | ICD-10-CM

## 2022-06-27 DIAGNOSIS — S42035D Nondisplaced fracture of lateral end of left clavicle, subsequent encounter for fracture with routine healing: Secondary | ICD-10-CM

## 2022-06-27 LAB — POCT RAPID STREP A (OFFICE): Rapid Strep A Screen: POSITIVE — AB

## 2022-06-27 MED ORDER — AMOXICILLIN 500 MG PO CAPS: 500.0000 mg | ORAL_CAPSULE | Freq: Two times a day (BID) | ORAL | 0 refills | Status: AC

## 2022-06-27 MED ORDER — AMPHETAMINE-DEXTROAMPHET ER 30 MG PO CP24: ORAL_CAPSULE | ORAL | 0 refills | Status: DC

## 2022-06-27 NOTE — Assessment & Plan Note (Signed)
[place] CSRS reviewed.  Continue MSIR '15mg'$  TID PRN - she's dropped back down to BID dosing.  Continue lyrica '150mg'$  BID.
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
## 2022-06-27 NOTE — Assessment & Plan Note (Signed)
Streptococcal laryngopharyngitis - Rx amoxicillin '500mg'$  BID 10d course. Update if not improving with treatment. Further supportive measures reviewed as per patient instructions.

## 2022-06-27 NOTE — Assessment & Plan Note (Signed)
Seeing ortho, healing well, hopeful won't need surgery. Continues sling, with f/u planned in 3 wks.

## 2022-06-27 NOTE — Assessment & Plan Note (Signed)
Daughter recently killed by SIL, they now have custody of 3 grandchildren - support provided. She is planning to reach out to psych about counseling.

## 2022-06-27 NOTE — Patient Instructions (Addendum)
Good to see you today Continue current medicines.  You have persistent laryngitis and your strep test returned positive - treat with amoxicillin twice daily for 10 days.  Push fluids and plenty of rest.  May use ibuprofen for throat inflammation.  Salt water gargles. Suck on cold things like popsicles or warm things like herbal teas (whichever soothes the throat better). Let us know if fever >101.5, worsening pain, or trouble opening/closing mouth, or hoarse voice. Good to see you today, call clinic with questions.

## 2022-06-29 ENCOUNTER — Encounter (HOSPITAL_COMMUNITY): Payer: Self-pay | Admitting: Internal Medicine

## 2022-06-29 ENCOUNTER — Ambulatory Visit (HOSPITAL_BASED_OUTPATIENT_CLINIC_OR_DEPARTMENT_OTHER)
Admission: RE | Admit: 2022-06-29 | Discharge: 2022-06-29 | Disposition: A | Discharge status: Home / Self Care | Payer: Medicare Other | Source: Ambulatory Visit | Attending: Internal Medicine | Admitting: Internal Medicine

## 2022-06-29 ENCOUNTER — Ambulatory Visit (HOSPITAL_COMMUNITY)
Admission: RE | Admit: 2022-06-29 | Discharge: 2022-06-29 | Disposition: A | Discharge status: Home / Self Care | Payer: Medicare Other | Source: Ambulatory Visit | Attending: Family Medicine | Admitting: Family Medicine

## 2022-06-29 VITALS — BP 130/70 | HR 88 | Wt 119.6 lb

## 2022-06-29 DIAGNOSIS — I10 Essential (primary) hypertension: Secondary | ICD-10-CM | POA: Insufficient documentation

## 2022-06-29 DIAGNOSIS — R55 Syncope and collapse: Secondary | ICD-10-CM

## 2022-06-29 DIAGNOSIS — Z7982 Long term (current) use of aspirin: Secondary | ICD-10-CM | POA: Insufficient documentation

## 2022-06-29 DIAGNOSIS — F319 Bipolar disorder, unspecified: Secondary | ICD-10-CM | POA: Diagnosis not present

## 2022-06-29 DIAGNOSIS — M3489 Other systemic sclerosis: Secondary | ICD-10-CM

## 2022-06-29 DIAGNOSIS — M797 Fibromyalgia: Secondary | ICD-10-CM | POA: Diagnosis not present

## 2022-06-29 DIAGNOSIS — Z9882 Breast implant status: Secondary | ICD-10-CM | POA: Insufficient documentation

## 2022-06-29 DIAGNOSIS — M349 Systemic sclerosis, unspecified: Secondary | ICD-10-CM

## 2022-06-29 DIAGNOSIS — I2510 Atherosclerotic heart disease of native coronary artery without angina pectoris: Secondary | ICD-10-CM | POA: Diagnosis not present

## 2022-06-29 LAB — ECHOCARDIOGRAM COMPLETE
Area-P 1/2: 2.56 cm2
S' Lateral: 3.8 cm

## 2022-06-29 NOTE — Patient Instructions (Signed)
It was great to see you today! No medication changes are needed at this time.    Your physician wants you to follow-up in: 2 years with Dr Haroldine Laws and echo. You will receive a reminder letter in the mail two months in advance. If you don't receive a letter, please call our office to schedule the follow-up appointment.   Your physician has requested that you have an echocardiogram. Echocardiography is a painless test that uses sound waves to create images of your heart. It provides your doctor with information about the size and shape of your heart and how well your heart's chambers and valves are working. This procedure takes approximately one hour. There are no restrictions for this procedure. Please do NOT wear cologne, perfume, aftershave, or lotions (deodorant is allowed). Please arrive 15 minutes prior to your appointment time.   At the [facility], you and your health needs are our priority. As part of our continuing mission to provide you with exceptional heart care, we have created designated Provider Care Teams. These Care Teams include your primary Cardiologist (physician) and Advanced Practice Providers (APPs- Physician Assistants and Nurse Practitioners) who all work together to provide you with the care you need, when you need it.   You may see any of the following providers on your designated Care Team at your next follow up: Dr Glori Bickers Dr Loralie Champagne Dr. Roxana Hires, NP Lyda Jester, [place] [facility] [place], [place] Forestine Na, NP Audry Riles, PharmD   Please be sure to bring in all your medications bottles to every appointment.   If you have any questions or concerns before your next appointment please send [place] a message through [place] or call our office at [phone].    TO LEAVE A MESSAGE FOR THE NURSE SELECT OPTION 2, PLEASE LEAVE A MESSAGE INCLUDING: YOUR NAME DATE OF BIRTH CALL BACK NUMBER REASON FOR  CALL**this is important as we prioritize the call backs  YOU WILL RECEIVE A CALL BACK THE SAME DAY AS LONG AS YOU CALL BEFORE 4:00 PM

## 2022-06-29 NOTE — Progress Notes (Signed)
  Echocardiogram 2D Echocardiogram has been performed.  Rachel Duran M 06/29/2022, 2:24 PM
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
## 2022-06-30 ENCOUNTER — Other Ambulatory Visit: Payer: Self-pay | Admitting: Obstetrics and Gynecology

## 2022-06-30 NOTE — Telephone Encounter (Signed)
Last annual exam 10/22 Scheduled on 05/07/22  Last mammogram 12/2020

## 2022-07-06 ENCOUNTER — Other Ambulatory Visit: Payer: Self-pay | Admitting: Family Medicine

## 2022-07-06 NOTE — Telephone Encounter (Signed)
Name of Medication: Eszopiclone Name of Pharmacy: [facility] or Written Date and Quantity: 04/20/22, #90 Last Office Visit and Type: 06/27/22, 3 mo chronic pain f/u Next Office Visit and Type: 09/26/22, CPE Last Controlled Substance Agreement Date: 04/19/19 Last UDS: 04/19/19

## 2022-07-07 NOTE — Telephone Encounter (Signed)
ERx 

## 2022-07-13 ENCOUNTER — Ambulatory Visit (INDEPENDENT_AMBULATORY_CARE_PROVIDER_SITE_OTHER): Payer: Medicare Other

## 2022-07-13 ENCOUNTER — Ambulatory Visit (INDEPENDENT_AMBULATORY_CARE_PROVIDER_SITE_OTHER): Payer: Medicare Other | Admitting: Orthopedic Surgery

## 2022-07-13 DIAGNOSIS — S42032A Displaced fracture of lateral end of left clavicle, initial encounter for closed fracture: Secondary | ICD-10-CM | POA: Diagnosis not present

## 2022-07-14 ENCOUNTER — Encounter: Payer: Self-pay | Admitting: Orthopedic Surgery

## 2022-07-14 NOTE — Progress Notes (Signed)
Post-Op Visit Note   Patient: Rachel Duran           Date of Birth: 03-05-1956           MRN: [id number] Visit Date: 07/13/2022 PCP: Ria Bush, MD   Assessment & Plan:  Chief Complaint:  Chief Complaint  Patient presents with   Follow-up   Visit Diagnoses:  1. Displaced fracture of lateral end of left clavicle, initial encounter for closed fracture     Plan: Rachel Duran is a patient with left distal clavicle fracture sustained 05/27/2022.  She has been in a sling.  Doing exercises 3-4 times a day.  On examination no motion at the fracture site.  Minimal tenderness at the fracture site.  Radiographs do show some callus formation.  At this time would like for her to discontinue the sling but not really do any lifting with that left arm.  Follow-up in 3 weeks with repeat radiographs clinical recheck and likely release at that time.  Follow-Up Instructions: No follow-ups on file.   Orders:  Orders Placed This Encounter  Procedures   XR Clavicle Left   No orders of the defined types were placed in this encounter.   Imaging: XR Clavicle Left  Result Date: 07/14/2022 2 views left clavicle reviewed.  Shoulder is located.  Distal clavicle fracture does have a small amount of callus present.  No significant displacement compared to prior radiographs of the distal clavicle/lateral clavicle fracture.  Minimal elevation of the clavicle shaft relative to the coracoid process.   PMFS History: Patient Active Problem List   Diagnosis Date Noted   Grieving 06/27/2022   Elevated blood pressure reading in office without diagnosis of hypertension 05/31/2022   Closed nondisplaced fracture of lateral end of left clavicle 05/30/2022   Sore on toe 04/21/2022   Fusion of spine, cervical region    Anemia    Primary hypertension    Cervical spine arthritis with nerve pain 12/29/2021   C6 cervical fracture ([place]) 12/29/2021   C5 cervical fracture ([place]) 12/21/2021   Advanced  directives, counseling/discussion 09/23/2021   Streptococcal pharyngitis 08/18/2021   Swelling of right index finger 07/14/2021   Synovitis of right knee    Scoliosis 01/13/2021   Osteopenia 07/02/2020   Status post left knee surgery 06/01/2020   Pulmonary nodule 05/27/2020   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   Chronic patellofemoral pain of right knee 05/05/2020   Positive ANA (antinuclear antibody) 06/05/2019   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   Unilateral primary osteoarthritis, left hip 01/28/2019   Pelvic pain 12/12/2018   Common bile duct (CBD) obstruction    Venous insufficiency of left lower extremity 07/04/2018   Dyslipidemia 09/22/2017   DDD (degenerative disc disease), cervical 04/03/2017   DDD (degenerative disc disease), lumbar 04/03/2017   Onychomycosis 03/21/2017   Dysphagia 10/18/2016   Encounter for chronic pain management 10/18/2016   Biliary stasis 09/26/2016   Dry mouth 06/06/2016   HNP (herniated nucleus pulposus), lumbar 09/23/2015   Iron deficiency 07/30/2015   Health maintenance examination 06/15/2015   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   Clavicle enlargement 12/13/2014   Prediabetes 12/13/2014   Plantar fasciitis, bilateral 09/11/2014   Medicare annual wellness visit, subsequent 06/10/2014   Abnormal thyroid function test 06/10/2014   Chronic pain syndrome 06/10/2014   CFS (chronic fatigue syndrome) 04/08/2014   Postmenopausal atrophic vaginitis 10/19/2012   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   Cervical disc disorder with radiculopathy of cervical region 05/28/2010  APHTHOUS ULCERS 01/31/2008   Insomnia 11/09/2007   Constipation 10/11/2007   Allergic rhinitis 04/12/2007   MDD (major depressive disorder), recurrent episode, moderate ([place]) 03/05/2007   [name] syndrome 03/05/2007   GERD 03/05/2007   ROSACEA 03/05/2007   NEURALGIA 03/05/2007   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 C6- C7 -hx. past fusion(bone graft used)   Cholecystitis    Chronic pain    DDD (degenerative disc disease), lumbar 09/2015   dextroscoliosis with multilevel DDD and facet arthrosis most notable for R foraminal disc protrusion L4/5 producing severe R neural foraminal stenosis abutting R L4 nerve root, moderate spinal canal and mild lat recesss and R neural foraminal stenosis L3/4 (MRI)   Depression    bipolar depression   Disorders of porphyrin metabolism    Felon of finger of left hand 11/10/2016   Fibromyalgia    GERD (gastroesophageal reflux disease)    Headache    occasionally   Internal hemorrhoids    Interstitial cystitis 06/06/2012   hx.   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 (Dr Baird Cancer retinologist)   Tuberculosis     Family History  Problem Relation Age of Onset   CAD Father 50       MI, nonsmoker   Esophageal cancer Father 36   Stomach cancer Father    Scleroderma Mother    Hypertension Mother    Esophageal cancer Paternal Grandfather    Stomach cancer Paternal Grandfather    Diabetes Maternal Grandmother    Arthritis Brother    Stroke Neg Hx    Colon cancer Neg Hx    Rectal cancer Neg Hx     Past Surgical History:   Procedure Laterality Date   ANTERIOR CERVICAL DECOMP/DISCECTOMY FUSION  2004   C5/6, C6/7   ANTERIOR CERVICAL DECOMP/DISCECTOMY FUSION  02/2016   C3/4, C4/5 with plating [name])   ANTERIOR CERVICAL DECOMP/DISCECTOMY FUSION  12/29/2021   Procedure: ANTERIOR CERVICAL DECOMPRESSION/ DISCECTOMY FUSION CERVICAL THREE - SEVEN;  Surgeon: Newman Pies, MD;  Location: [place];  Service: Neurosurgery;;   ANTERIOR CERVICAL DECOMPRESSION/DISCECTOMY FUSION 4 LEVELS N/A 12/29/2021   Procedure: CERVICAL FIVE-SIX CORPECTOMY,;  Surgeon: Newman Pies, MD;  Location: [place];  Service: Neurosurgery;  Laterality: N/A;   AUGMENTATION MAMMAPLASTY Bilateral 03/25/2010   BREAST ENHANCEMENT SURGERY  2010   BREAST IMPLANT EXCHANGE  10/2014   exchange saline implants, B mastopexy/capsulorraphy ([facility])   BUNIONECTOMY Bilateral yrs ago   [place]   x 1   CHOLECYSTECTOMY  06/11/2012   Procedure: LAPAROSCOPIC CHOLECYSTECTOMY WITH INTRAOPERATIVE CHOLANGIOGRAM;  Surgeon:  [name], MD,FACS;  [facility];  Service: General;  Laterality: N/A;   COLONOSCOPY  02/2018   done for positive cologuard - WNL, rpt 10 yrs Fuller Plan)   CYSTOSCOPY     ERCP  05/22/2012   Procedure: ENDOSCOPIC RETROGRADE CHOLANGIOPANCREATOGRAPHY (ERCP);  Surgeon: [name], MD,FACG;  [facility];  Service: Endoscopy;  Laterality: N/A;   ERCP N/A 09/17/2013   Procedure: ENDOSCOPIC RETROGRADE CHOLANGIOPANCREATOGRAPHY (ERCP);  Surgeon: [name], MD;  Location[facility];  Service: Endoscopy;  Laterality: N/A;   ERCP N/A 09/27/2018   Procedure: ENDOSCOPIC RETROGRADE CHOLANGIOPANCREATOGRAPHY (ERCP);  Surgeon: [name], MD;  Location: [facility];  Service: Endoscopy;  Laterality: N/A;   ESOPHAGOGASTRODUODENOSCOPY  09/2016   WNL. esophagus dilated Fuller Plan)   HEMORRHOID BANDING  09/23/2013   --Dr. Greer Pickerel   HERNIA REPAIR     inguinal   HYSTEROSCOPY W/ ENDOMETRIAL ABLATION     KNEE  ARTHROSCOPY Right 04/06/2021   Procedure: RIGHT KNEE ARTHROSCOPY WITH DEBRIDEMENT;  Surgeon: [name], MD;  Location: [place];  Service: Orthopedics;  Laterality: Right;   NASAL SINUS SURGERY     x5   PARTIAL KNEE ARTHROPLASTY Right 06/01/2020   Procedure: Right knee patellofemoral replacement;  Surgeon: [name], MD;  Location: [place];  Service: Orthopedics;  Laterality: Right;   POSTERIOR CERVICAL FUSION/FORAMINOTOMY N/A 12/29/2021   Procedure: POSTERIOR CERVICAL FUSION CERVICAL THREE-SEVEN;  Surgeon: Newman Pies, MD;  Location: [place];  Service: Neurosurgery;  Laterality: N/A;   REMOVAL OF STONES  09/27/2018   Procedure: REMOVAL OF STONES;  Surgeon: [name], MD;  [facility];  Service: Endoscopy;;   SPHINCTEROTOMY  09/27/2018   Procedure: SPHINCTEROTOMY;  Surgeon: [name], MD;  [facility];  Service: Endoscopy;;   TONSILLECTOMY     removed as a child   TOTAL HIP ARTHROPLASTY Left 03/12/2019   Procedure: LEFT TOTAL HIP ARTHROPLASTY ANTERIOR APPROACH;  [name], [name], MD)   UPPER GASTROINTESTINAL ENDOSCOPY     Social History   Occupational History   Occupation: retired    Fish farm manager: UNEMPLOYED  Tobacco Use   Smoking status: Never    Passive exposure: Never   Smokeless tobacco: Never  Vaping Use   Vaping Use: Never used  Substance and Sexual Activity   Alcohol use: No    Alcohol/week: 0.0 standard drinks of alcohol   Drug use: No   Sexual activity: Yes    Partners: Male    Birth control/protection: Post-menopausal    Comment: vasectomy, 1st intercourse- 58 y.o., partner- 1, DES exposure unknown

## 2022-07-20 ENCOUNTER — Other Ambulatory Visit: Payer: Self-pay | Admitting: Family Medicine

## 2022-07-20 NOTE — Telephone Encounter (Signed)
Name of Medication: MSIR '15mg'$  Name of Pharmacy: [facility] or Written Date and Quantity: 06-07-22 #90 Last Office Visit and Type: Acute 06-27-22 Next Office Visit and Type: 09-26-22 Last Controlled Substance Agreement Date: NA Last UDS: 04-19-19

## 2022-07-21 NOTE — Telephone Encounter (Signed)
ERx 

## 2022-08-01 ENCOUNTER — Other Ambulatory Visit: Payer: Self-pay | Admitting: Physician Assistant

## 2022-08-01 NOTE — Telephone Encounter (Signed)
Next Visit: 09/06/2022   Last Visit: 04/04/2022   Last Fill: 05/09/2022   Dx:  Limited systemic sclerosis    Current Dose per office note on 04/04/2022: amlodipine 7.5 mg daily    Okay to refill Amlodipine?

## 2022-08-03 ENCOUNTER — Encounter: Payer: Self-pay | Admitting: Orthopedic Surgery

## 2022-08-03 ENCOUNTER — Ambulatory Visit (INDEPENDENT_AMBULATORY_CARE_PROVIDER_SITE_OTHER): Payer: Medicare Other | Admitting: Orthopedic Surgery

## 2022-08-03 ENCOUNTER — Ambulatory Visit (INDEPENDENT_AMBULATORY_CARE_PROVIDER_SITE_OTHER): Payer: Medicare Other

## 2022-08-03 DIAGNOSIS — S42032A Displaced fracture of lateral end of left clavicle, initial encounter for closed fracture: Secondary | ICD-10-CM | POA: Diagnosis not present

## 2022-08-03 NOTE — Progress Notes (Signed)
Post-Op Visit Note   Patient: Rachel Duran           Date of Birth: 12/04/1955           MRN: [id number] Visit Date: 08/03/2022 PCP: Ria Bush, MD   Assessment & Plan:  Chief Complaint:  Chief Complaint  Patient presents with   Left Shoulder - Fracture, Follow-up   Visit Diagnoses:  1. Displaced fracture of lateral end of left clavicle, initial encounter for closed fracture     Plan: Rachel Duran is a 67 year old patient who is now about 2 months out left distal clavicle fracture.  She is doing well.  Out of the sling.  On exam no real tenderness to palpation around the fracture site.  I do not detect any motion at the fracture site.  Rotator cuff strength is good and passive range of motion is good.  She is taking care of of children currently due to a tragic accident involving her daughter.  She has been able to manage that knee responsibility without too much interference from the left shoulder.  At this time recommended that she do range of motion as tolerated and no lifting more than 5 pounds for the next month and then after that activity as tolerated.  If she has recurrent symptoms she should come back and we can do CT scanning to make sure she has achieved union.  Follow-Up Instructions: No follow-ups on file.   Orders:  Orders Placed This Encounter  Procedures   XR Clavicle Left   No orders of the defined types were placed in this encounter.   Imaging: XR Clavicle Left  Result Date: 08/03/2022 AP and oblique views of the left clavicle reviewed.  Callus formation is noted.  Still some lucency around the fracture ends but no evidence of definite nonunion.  Shoulder is located.  Overall positive interval change compared to prior radiographs   PMFS History: Patient Active Problem List   Diagnosis Date Noted   Grieving 06/27/2022   Elevated blood pressure reading in office without diagnosis of hypertension 05/31/2022   Closed nondisplaced fracture of  lateral end of left clavicle 05/30/2022   Sore on toe 04/21/2022   Fusion of spine, cervical region    Anemia    Primary hypertension    Cervical spine arthritis with nerve pain 12/29/2021   C6 cervical fracture ([place]) 12/29/2021   C5 cervical fracture ([place]) 12/21/2021   Advanced directives, counseling/discussion 09/23/2021   Streptococcal pharyngitis 08/18/2021   Swelling of right index finger 07/14/2021   Synovitis of right knee    Scoliosis 01/13/2021   Osteopenia 07/02/2020   Status post left knee surgery 06/01/2020   Pulmonary nodule 05/27/2020   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   Chronic patellofemoral pain of right knee 05/05/2020   Positive ANA (antinuclear antibody) 06/05/2019   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   Unilateral primary osteoarthritis, left hip 01/28/2019   Pelvic pain 12/12/2018   Common bile duct (CBD) obstruction    Venous insufficiency of left lower extremity 07/04/2018   Dyslipidemia 09/22/2017   DDD (degenerative disc disease), cervical 04/03/2017   DDD (degenerative disc disease), lumbar 04/03/2017   Onychomycosis 03/21/2017   Dysphagia 10/18/2016   Encounter for chronic pain management 10/18/2016   Biliary stasis 09/26/2016   Dry mouth 06/06/2016   HNP (herniated nucleus pulposus), lumbar 09/23/2015   Iron deficiency 07/30/2015   Health maintenance examination 06/15/2015   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   Clavicle enlargement 12/13/2014  Prediabetes 12/13/2014   Plantar fasciitis, bilateral 09/11/2014   Medicare annual wellness visit, subsequent 06/10/2014   Abnormal thyroid function test 06/10/2014   Chronic pain syndrome 06/10/2014   CFS (chronic fatigue syndrome) 04/08/2014   Postmenopausal atrophic vaginitis 10/19/2012   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   Cervical disc disorder with radiculopathy of cervical region 05/28/2010   APHTHOUS ULCERS 01/31/2008   Insomnia 11/09/2007   Constipation  10/11/2007   Allergic rhinitis 04/12/2007   MDD (major depressive disorder), recurrent episode, moderate ([place]) 03/05/2007   [name] syndrome 03/05/2007   GERD 03/05/2007   ROSACEA 03/05/2007   NEURALGIA 03/05/2007   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 C6- C7 -hx. past fusion(bone graft used)   Cholecystitis    Chronic pain    DDD (degenerative disc disease), lumbar 09/2015   dextroscoliosis with multilevel DDD and facet arthrosis most notable for R foraminal disc protrusion L4/5 producing severe R neural foraminal stenosis abutting R L4 nerve root, moderate spinal canal and mild lat recesss and R neural foraminal stenosis L3/4 (MRI)   Depression    bipolar depression   Disorders of porphyrin metabolism    Felon of finger of left hand 11/10/2016   Fibromyalgia    GERD (gastroesophageal reflux disease)    Headache    occasionally   Internal hemorrhoids    Interstitial cystitis 06/06/2012   hx.   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 (Dr Baird Cancer retinologist)   Tuberculosis     Family History  Problem Relation Age of Onset   CAD Father 67       MI, nonsmoker   Esophageal cancer Father 4   Stomach cancer Father     Scleroderma Mother    Hypertension Mother    Esophageal cancer Paternal Grandfather    Stomach cancer Paternal Grandfather    Diabetes Maternal Grandmother    Arthritis Brother    Stroke Neg Hx    Colon cancer Neg Hx    Rectal cancer Neg Hx     Past Surgical History:  Procedure Laterality Date   ANTERIOR CERVICAL DECOMP/DISCECTOMY FUSION  2004   C5/6, C6/7   ANTERIOR CERVICAL DECOMP/DISCECTOMY FUSION  02/2016   C3/4, C4/5 with plating [name])   ANTERIOR CERVICAL DECOMP/DISCECTOMY FUSION  12/29/2021   Procedure: ANTERIOR CERVICAL DECOMPRESSION/ DISCECTOMY FUSION CERVICAL THREE - SEVEN;  Surgeon: Newman Pies, MD;  Location: [place];  Service: Neurosurgery;;   ANTERIOR CERVICAL DECOMPRESSION/DISCECTOMY FUSION 4 LEVELS N/A 12/29/2021   Procedure: CERVICAL FIVE-SIX CORPECTOMY,;  Surgeon: Newman Pies, MD;  Location: [place];  Service: Neurosurgery;  Laterality: N/A;   AUGMENTATION MAMMAPLASTY Bilateral 03/25/2010   BREAST ENHANCEMENT SURGERY  2010   BREAST IMPLANT EXCHANGE  10/2014   exchange saline implants, B mastopexy/capsulorraphy ([facility])   BUNIONECTOMY Bilateral yrs ago   [place]   x 1   CHOLECYSTECTOMY  06/11/2012   Procedure: LAPAROSCOPIC CHOLECYSTECTOMY WITH INTRAOPERATIVE CHOLANGIOGRAM;  Surgeon: [name], MD,FACS;  [facility];  Service: General;  Laterality: N/A;   COLONOSCOPY  02/2018   done for positive cologuard - WNL, rpt 10 yrs Fuller Plan)   CYSTOSCOPY     ERCP  05/22/2012   Procedure: ENDOSCOPIC RETROGRADE CHOLANGIOPANCREATOGRAPHY (ERCP);  Surgeon: [name], MD,FACG;  [facility];  Service: Endoscopy;  Laterality: N/A;   ERCP N/A 09/17/2013   Procedure: ENDOSCOPIC RETROGRADE CHOLANGIOPANCREATOGRAPHY (ERCP);  Surgeon: [name], MD;  [facility];  Service: Endoscopy;  Laterality: N/A;   ERCP N/A 09/27/2018   Procedure: ENDOSCOPIC RETROGRADE CHOLANGIOPANCREATOGRAPHY (ERCP);  Surgeon: [name], MD;  [facility];  Service: Endoscopy;  Laterality: N/A;   ESOPHAGOGASTRODUODENOSCOPY  09/2016   WNL. esophagus dilated Fuller Plan)   HEMORRHOID BANDING  09/23/2013   --Dr. Greer Pickerel   HERNIA REPAIR     inguinal   HYSTEROSCOPY W/ ENDOMETRIAL ABLATION     KNEE ARTHROSCOPY Right 04/06/2021   Procedure: RIGHT KNEE ARTHROSCOPY WITH DEBRIDEMENT;  Surgeon: [name], MD;  Location: [place];  Service: Orthopedics;  Laterality: Right;   NASAL SINUS SURGERY     x5   PARTIAL KNEE ARTHROPLASTY Right 06/01/2020   Procedure: Right knee patellofemoral replacement;  Surgeon: [name], MD;  Location: [place];  Service: Orthopedics;  Laterality: Right;   POSTERIOR CERVICAL FUSION/FORAMINOTOMY N/A 12/29/2021   Procedure: POSTERIOR CERVICAL FUSION CERVICAL THREE-SEVEN;  Surgeon: Newman Pies, MD;  Location: [place];  Service: Neurosurgery;  Laterality: N/A;   REMOVAL OF STONES  09/27/2018   Procedure: REMOVAL OF STONES;  Surgeon: [name], MD;  [facility];  Service: Endoscopy;;   SPHINCTEROTOMY  09/27/2018   Procedure: SPHINCTEROTOMY;  Surgeon: [name], MD;  [facility];  Service: Endoscopy;;   TONSILLECTOMY     removed as a child   TOTAL HIP ARTHROPLASTY Left 03/12/2019   Procedure: LEFT TOTAL HIP ARTHROPLASTY ANTERIOR APPROACH;  [name], [name], MD)   UPPER GASTROINTESTINAL ENDOSCOPY     Social History   Occupational History   Occupation: retired    Fish farm manager: UNEMPLOYED  Tobacco Use   Smoking status: Never    Passive exposure: Never   Smokeless tobacco: Never  Vaping Use   Vaping Use: Never used  Substance and Sexual Activity   Alcohol use: No    Alcohol/week: 0.0 standard drinks of alcohol   Drug use: No   Sexual activity: Yes    Partners: Male    Birth control/protection: Post-menopausal    Comment: vasectomy, 1st intercourse- 31 y.o., partner- 1, DES exposure unknown

## 2022-08-15 ENCOUNTER — Encounter: Payer: Self-pay | Admitting: Obstetrics and Gynecology

## 2022-08-15 NOTE — Telephone Encounter (Signed)
Routing to Dr. Quincy Simmonds to review.

## 2022-08-23 NOTE — Progress Notes (Signed)
Office Visit Note  Patient: Rachel Duran             Date of Birth: Feb 17, 1956           MRN: [id number]             PCP: Ria Bush, MD Referring: Ria Bush, MD Visit Date: 09/06/2022 Occupation: @GUAROCC$ @  Subjective:  Pain in multiple joints  History of Present Illness: Rachel Duran is a 67 y.o. female with past medical history of limited systemic sclerosis, osteoarthritis, raynauds and fibromyalgia. Patient complains of arthritic pain in her knees and hands that bothers her in the mornings and at the end of the days. She has difficulty climbing stairs. She uses voltaren gel and aleve as needed for relief. She also has extensive history of spinal surgeries with residual nerve pain in the left first and second digits. She uses topical lidocaine which provides some relief.  She follows up with neurosurgery every six months.  Raynauds phenomenon still bothersome for the patient in the fingers. They become purple. Not bothersome in the toes. She is on Norvasc 7.5 mg and needs refill of 5 mg tablet today. She uses gloves as needed.  She has been under a lot of stress which is making her Raynauds worse.  She was evaluated by Dr. Ronna Polio on June 29, 2022.  Echocardiogram showed no evidence of PAH.  High-resolution CT on November 2021 showed no ILD.  Follow-up high-resolution CT is pending.  She denies any increased shortness of breath.   Activities of Daily Living:  Patient reports morning stiffness for 2 hours.   Patient Reports nocturnal pain.  Difficulty dressing/grooming: Denies Difficulty climbing stairs: Reports Difficulty getting out of chair: Reports Difficulty using hands for taps, buttons, cutlery, and/or writing: Reports  Review of Systems  Constitutional:  Positive for fatigue.  HENT:  Positive for mouth dryness. Negative for mouth sores.   Eyes:  Positive for dryness.  Respiratory: Negative.  Negative for shortness of breath.    Cardiovascular: Negative.  Negative for chest pain and palpitations.  Gastrointestinal:  Positive for constipation. Negative for blood in stool and diarrhea.  Endocrine: Negative.  Negative for increased urination.  Genitourinary: Negative.  Negative for involuntary urination.  Musculoskeletal:  Positive for joint pain, gait problem, joint pain, myalgias, muscle weakness, morning stiffness, muscle tenderness and myalgias. Negative for joint swelling.  Skin: Negative.  Negative for color change, rash, hair loss and sensitivity to sunlight.  Allergic/Immunologic: Negative.  Negative for susceptible to infections.  Neurological:  Negative for dizziness and headaches.  Hematological: Negative.  Negative for swollen glands.  Psychiatric/Behavioral:  Positive for sleep disturbance. Negative for depressed mood. The patient is nervous/anxious.     PMFS History:  Patient Active Problem List   Diagnosis Date Noted   Grieving 06/27/2022   Elevated blood pressure reading in office without diagnosis of hypertension 05/31/2022   Closed nondisplaced fracture of lateral end of left clavicle 05/30/2022   Sore on toe 04/21/2022   Fusion of spine, cervical region    Anemia    Primary hypertension    Cervical spine arthritis with nerve pain 12/29/2021   C6 cervical fracture ([place]) 12/29/2021   C5 cervical fracture ([place]) 12/21/2021   Advanced directives, counseling/discussion 09/23/2021   Streptococcal pharyngitis 08/18/2021   Swelling of right index finger 07/14/2021   Synovitis of right knee    Scoliosis 01/13/2021   Osteopenia 07/02/2020   Status post left knee surgery 06/01/2020   Pulmonary  nodule 05/27/2020   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   Chronic patellofemoral pain of right knee 05/05/2020   Positive ANA (antinuclear antibody) 06/05/2019   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   Unilateral primary osteoarthritis, left hip 01/28/2019   Pelvic pain 12/12/2018    Common bile duct (CBD) obstruction    Venous insufficiency of left lower extremity 07/04/2018   Dyslipidemia 09/22/2017   DDD (degenerative disc disease), cervical 04/03/2017   DDD (degenerative disc disease), lumbar 04/03/2017   Onychomycosis 03/21/2017   Dysphagia 10/18/2016   Encounter for chronic pain management 10/18/2016   Biliary stasis 09/26/2016   Dry mouth 06/06/2016   HNP (herniated nucleus pulposus), lumbar 09/23/2015   Iron deficiency 07/30/2015   Health maintenance examination 06/15/2015   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   Clavicle enlargement 12/13/2014   Prediabetes 12/13/2014   Plantar fasciitis, bilateral 09/11/2014   Medicare annual wellness visit, subsequent 06/10/2014   Abnormal thyroid function test 06/10/2014   Chronic pain syndrome 06/10/2014   CFS (chronic fatigue syndrome) 04/08/2014   Postmenopausal atrophic vaginitis 10/19/2012   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   Cervical disc disorder with radiculopathy of cervical region 05/28/2010   APHTHOUS ULCERS 01/31/2008   Insomnia 11/09/2007   Constipation 10/11/2007   Allergic rhinitis 04/12/2007   MDD (major depressive disorder), recurrent episode, moderate ([place]) 03/05/2007   [name] syndrome 03/05/2007   GERD 03/05/2007   ROSACEA 03/05/2007   NEURALGIA 03/05/2007   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 C6- C7 -hx. past fusion(bone graft used)   Cholecystitis    Chronic pain    DDD (degenerative disc disease), lumbar 09/2015   dextroscoliosis with multilevel DDD and facet arthrosis most notable for R foraminal disc protrusion L4/5 producing severe R neural  foraminal stenosis abutting R L4 nerve root, moderate spinal canal and mild lat recesss and R neural foraminal stenosis L3/4 (MRI)   Depression    bipolar depression   Disorders of porphyrin metabolism    Felon of finger of left hand 11/10/2016   Fibromyalgia    GERD (gastroesophageal reflux disease)    Headache    occasionally   Internal hemorrhoids    Interstitial cystitis 06/06/2012   hx.   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   Scleroderma ([place]) 04/24/2020   Seronegative arthritis    Tayden Nichelson   Stargardt's disease 05/2015   hereditary macular degeneration (Dr Baird Cancer retinologist)   Tuberculosis     Family History  Problem Relation Age of Onset   CAD Father 74       MI, nonsmoker   Esophageal cancer Father 24   Stomach cancer Father    Scleroderma Mother    Hypertension Mother  Esophageal cancer Paternal Grandfather    Stomach cancer Paternal Grandfather    Diabetes Maternal Grandmother    Arthritis Brother    Stroke Neg Hx    Colon cancer Neg Hx    Rectal cancer Neg Hx    Past Surgical History:  Procedure Laterality Date   ANTERIOR CERVICAL DECOMP/DISCECTOMY FUSION  2004   C5/6, C6/7   ANTERIOR CERVICAL DECOMP/DISCECTOMY FUSION  02/2016   C3/4, C4/5 with plating [name])   ANTERIOR CERVICAL DECOMP/DISCECTOMY FUSION  12/29/2021   Procedure: ANTERIOR CERVICAL DECOMPRESSION/ DISCECTOMY FUSION CERVICAL THREE - SEVEN;  Surgeon: Newman Pies, MD;  Location: [facility];  Service: Neurosurgery;;   ANTERIOR CERVICAL DECOMPRESSION/DISCECTOMY FUSION 4 LEVELS N/A 12/29/2021   Procedure: CERVICAL FIVE-SIX CORPECTOMY,;  Surgeon: Newman Pies, MD;  Location: [facility] [facility];   Service: Neurosurgery;  Laterality: N/A;   AUGMENTATION MAMMAPLASTY Bilateral 03/25/2010   BREAST ENHANCEMENT SURGERY  2010   BREAST IMPLANT EXCHANGE  10/2014   exchange saline implants, B mastopexy/capsulorraphy ([facility])   BUNIONECTOMY Bilateral yrs ago   [place]   x 1   CHOLECYSTECTOMY  06/11/2012   Procedure: LAPAROSCOPIC CHOLECYSTECTOMY WITH INTRAOPERATIVE CHOLANGIOGRAM;  Surgeon: [name], MD,FACS;  Location: [facility];  Service: General;  Laterality: N/A;   COLONOSCOPY  02/2018   done for positive cologuard - WNL, rpt 10 yrs [facility] Plan)   CYSTOSCOPY     ERCP  05/22/2012   Procedure: ENDOSCOPIC RETROGRADE CHOLANGIOPANCREATOGRAPHY (ERCP);  Surgeon: [name], MD,FACG;  Location: [facility];  Service: Endoscopy;  Laterality: N/A;   ERCP N/A 09/17/2013   Procedure: ENDOSCOPIC RETROGRADE CHOLANGIOPANCREATOGRAPHY (ERCP);  Surgeon: [name], MD;  Location: [facility];  Service: Endoscopy;  Laterality: N/A;   ERCP N/A 09/27/2018   Procedure: ENDOSCOPIC RETROGRADE CHOLANGIOPANCREATOGRAPHY (ERCP);  Surgeon: [name], MD;  Location: [facility];  Service: Endoscopy;  Laterality: N/A;   ESOPHAGOGASTRODUODENOSCOPY  09/2016   WNL. esophagus dilated Fuller Plan)   HEMORRHOID BANDING  09/23/2013   --Dr. Greer Pickerel   HERNIA REPAIR     inguinal   HYSTEROSCOPY W/ ENDOMETRIAL ABLATION     KNEE ARTHROSCOPY Right 04/06/2021   Procedure: RIGHT KNEE ARTHROSCOPY WITH DEBRIDEMENT;  Surgeon: [name], MD;  Location: [facility];  Service: Orthopedics;  Laterality: Right;   NASAL SINUS SURGERY     x5   PARTIAL KNEE ARTHROPLASTY Right 06/01/2020   Procedure: Right knee patellofemoral replacement;  Surgeon: [name], MD;  Location: [place];  Service: Orthopedics;  Laterality: Right;   POSTERIOR CERVICAL FUSION/FORAMINOTOMY N/A 12/29/2021   Procedure: POSTERIOR CERVICAL FUSION CERVICAL THREE-SEVEN;  Surgeon:  Newman Pies, MD;  Location: [facility];  Service: Neurosurgery;  Laterality: N/A;   REMOVAL OF STONES  09/27/2018   Procedure: REMOVAL OF STONES;  Surgeon: [name], MD;  [facility];  Service: Endoscopy;;   SPHINCTEROTOMY  09/27/2018   Procedure: SPHINCTEROTOMY;  Surgeon: [name], MD;  [facility];  Service: Endoscopy;;   TONSILLECTOMY     removed as a child   TOTAL HIP ARTHROPLASTY Left 03/12/2019   Procedure: LEFT TOTAL HIP ARTHROPLASTY ANTERIOR APPROACH;  [name], [name], MD)   UPPER GASTROINTESTINAL ENDOSCOPY     Social History   Social History Narrative   Lives with husband and dog   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   Activity: tries to walk dog (45 min 3x/wk), yoga   Diet: good water, fruits/vegetables daily  Rheum: Yusuf Yu   Psychiatrist: [employer] [facility]   Surgery: Redmond Pulling   GIFuller Plan   Urology: Amalia Hailey   Immunization History  Administered Date(s) Administered   COVID-19, mRNA, vaccine(Comirnaty)12 years and older 05/25/2022   Influenza Split 05/04/2011, 04/04/2012   Influenza Whole 05/25/2007, 04/28/2008, 04/30/2009, 03/26/2010   Influenza,inj,Quad PF,6+ Mos 04/16/2015, 04/15/2016, 05/16/2017, 04/10/2018, 04/19/2019, 04/07/2020, 04/20/2022   Influenza-Unspecified 03/25/2014   MMR 11/28/2017   Moderna Sars-Covid-2 Vaccination 09/02/2019, 09/30/2019, 04/07/2020   Pneumococcal Conjugate-13 08/02/2013   Pneumococcal Polysaccharide-23 07/30/2009   Td 11/01/2005, 11/10/2016   Zoster Recombinat (Shingrix) 07/21/2017, 11/03/2017     Objective: Vital Signs: BP (!) 149/90 (BP Location: Left Arm, Patient Position: Sitting, Cuff Size: Normal)   Pulse 96   Resp 18   Ht 5' 3"$  (1.6 m)   Wt 113 lb 12.8 oz (51.6 kg)   LMP 07/25/1998   BMI 20.16 kg/m    Physical Exam Vitals and nursing note reviewed.  Constitutional:      Appearance: She is well-developed.  HENT:     Head: Normocephalic and  atraumatic.  Eyes:     Conjunctiva/sclera: Conjunctivae normal.  Cardiovascular:     Rate and Rhythm: Normal rate and regular rhythm.     Heart sounds: Normal heart sounds.  Pulmonary:     Effort: Pulmonary effort is normal.     Breath sounds: Normal breath sounds.  Abdominal:     General: Bowel sounds are normal.     Palpations: Abdomen is soft.  Musculoskeletal:     Cervical back: Decreased range of motion.  Lymphadenopathy:     Cervical: No cervical adenopathy.  Skin:    General: Skin is warm and dry.     Capillary Refill: Capillary refill takes less than 2 seconds.  Neurological:     Mental Status: She is alert and oriented to person, place, and time.  Psychiatric:        Behavior: Behavior normal.     Musculoskeletal Exam: Thickening of the R. Sternoclavicular Joint. Limited ROM of neck Shoulder joints, elbow joints, hip joints, wrist joints, MCPs, PIPs, and DIPs good ROM. Thickening of the 1st and 2nd DIP and PIP joints and subluxation of the first digit consistent with osteoarthritis. Capillary refill is within normal limits. No sclerodactyly. Right knee replacement has warmth but no effusion.  Left knee has good ROM with no warmth or effusion.  Ankle joints have good ROM with no tenderness or joint swelling. Thoracic and lumbar scoliosis. Scar over the cervical spine from previous surgery.   CDAI Exam: CDAI Score: -- Patient Global: --; Provider Global: -- Swollen: --; Tender: -- Joint Exam 09/06/2022   No joint exam has been documented for this visit   There is currently no information documented on the homunculus. Go to the Rheumatology activity and complete the homunculus joint exam.  Investigation: No additional findings.  Imaging: No results found.  Recent Labs: Lab Results  Component Value Date   WBC 7.7 04/20/2022   HGB 12.5 04/20/2022   PLT 355.0 04/20/2022   NA 134 (L) 04/20/2022   K 4.6 04/20/2022   CL 99 04/20/2022   CO2 29 04/20/2022   GLUCOSE  106 (H) 04/20/2022   BUN 8 04/20/2022   CREATININE 0.66 04/20/2022   BILITOT 0.5 01/10/2022   ALKPHOS 72 01/10/2022   AST 14 (L) 01/10/2022   ALT 9 01/10/2022   PROT 5.6 (L) 01/10/2022   ALBUMIN 3.2 (L) 01/10/2022   CALCIUM 9.6 04/20/2022   GFRAA >60 03/13/2019  Speciality Comments: Amlodipine 38m 2.5 mg because tablets crumble.   Procedures:  No procedures performed Allergies: Celebrex [celecoxib], Inh [isoniazid], Cymbalta [duloxetine hcl], Nucynta [tapentadol], Silenor [doxepin hcl], Benzoin, Duragesic-100 [fentanyl], Keflex [cephalexin], Codeine phosphate, Demerol [meperidine hcl], Lithobid [lithium], and Sulfamethoxazole   Assessment / Plan:     Visit Diagnoses: 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[place] - 10/06/21: AVISE lupus index -1.1, ANA positive, titer negative, dsDNA equivocal, (RNP, Ro, SSB, Smith, centromere, SCL 70, RNA polymerase 3, Jo 1, CB Negative). Patient continues to have [name] in her fingers bilaterally. Capillary refill was normal on physical exam. No sclerodactyly, tapering of the fingers or ulcers are present on exam. She was seen by cardiology and does not have pulmonary hypertension.  Restriction CT was negative for ILD in October 2021.  She is supposed to have repeat CT scan.  She will have CBC and CMP done by PCP at upcoming visit.   [name] disease without gangrene - Patient is on Norvasc 7.5 mg QD.  She has been under a lot of stress due to family tragedy.  Relaxation techniques and meditation was advised.  Her symptoms tend to improve in the summer months. Education provided on the importance of gloves and hand warmers.   Primary osteoarthritis of both hands. DIP and PIP changes consistent with osteoarthritis. Continue to use Voltaren gel as needed.  Joint protection muscle strengthening was discussed.  S/P hip replacement, left-she had good Ro of motion without discomfort.  Primary osteoarthritis of left knee.-She continues to have some discomfort.  She  will continue to exercise as tolerated and use voltaren gel for relief.    Status post right partial knee replacement-she will continue  to exercise as tolerated.   DDD (degenerative disc disease), cervical - history of disc disease and had fusion in the past.  She had fall after a syncopal episode in April 2023 resulting in cervical spine fracture. She continues to have nerve pain in her first and second digits of the left hand and uses topical lidocaine as needed. She is seen by neurosurgery every 6 months.   DDD (degenerative disc disease), lumbar. Seen by neurosurgery every 6 months.  She continues to have thoracic and lumbar spine discomfort.  She has severe scoliosis.  She has been getting pain management through her PCP.  Other form of scoliosis of thoracolumbar spine. Prominent thoracolumbar scoliosis present on physical exam.   Osteopenia of neck of right femur - October 06, 2021 the BMD measured at [facility] is 0.704 g/cm2 with a T-score of-2.4, BMD 0.704.  Her DXA followed by her GYN.   Fibromyalgia - managed by primary care.  She continues to have generalized pain and discomfort from fibromyalgia.  She is also having a flare due to stressful situation.  Chronic pain syndrome -  Patient states she was discharged by Dr. LDagoberto Ligasas she is getting pain management to her PCP.  Other fatigue-she has been experiencing increased fatigue related to fibromyalgia.  Other medical problems listed as follows:  Prediabetes  Hx of porphyria  History of bipolar disorder  History of gastroesophageal reflux (GERD)  CFS (chronic fatigue syndrome)  Family history of scleroderma  Orders: No orders of the defined types were placed in this encounter.  No orders of the defined types were placed in this encounter.   Face-to-face time spent with patient was 30 minutes. Greater than 50% of time was spent in counseling and coordination of care.  Follow-Up Instructions: Return in about 6 months  (around 03/07/2023) for  Scleroderma.   Bo Merino, MD  Note - This record has been created using Editor, commissioning.  Chart creation errors have been sought, but may not always  have been located. Such creation errors do not reflect on  the standard of medical care.

## 2022-09-01 ENCOUNTER — Other Ambulatory Visit: Payer: Self-pay | Admitting: Family Medicine

## 2022-09-01 NOTE — Telephone Encounter (Signed)
Name of Medication: MSIR Name of Pharmacy: [facility] or Written Date and Quantity: 06/2822, #90 Last Office Visit and Type: 06/27/22, 3 mo pain mgmt Next Office Visit and Type: 09/26/22, CPE Last Controlled Substance Agreement Date: 04/19/19 Last UDS: 04/19/19

## 2022-09-02 NOTE — Telephone Encounter (Signed)
ERx 

## 2022-09-06 ENCOUNTER — Encounter: Payer: Self-pay | Admitting: Rheumatology

## 2022-09-06 ENCOUNTER — Ambulatory Visit: Payer: Medicare Other | Attending: Rheumatology | Admitting: Rheumatology

## 2022-09-06 VITALS — BP 149/90 | HR 96 | Resp 18 | Ht 63.0 in | Wt 113.8 lb

## 2022-09-06 DIAGNOSIS — M1712 Unilateral primary osteoarthritis, left knee: Secondary | ICD-10-CM

## 2022-09-06 DIAGNOSIS — M19041 Primary osteoarthritis, right hand: Secondary | ICD-10-CM | POA: Diagnosis not present

## 2022-09-06 DIAGNOSIS — R5383 Other fatigue: Secondary | ICD-10-CM

## 2022-09-06 DIAGNOSIS — I7300 Raynaud's syndrome without gangrene: Secondary | ICD-10-CM | POA: Diagnosis not present

## 2022-09-06 DIAGNOSIS — M5136 Other intervertebral disc degeneration, lumbar region: Secondary | ICD-10-CM

## 2022-09-06 DIAGNOSIS — Z8659 Personal history of other mental and behavioral disorders: Secondary | ICD-10-CM

## 2022-09-06 DIAGNOSIS — Z8639 Personal history of other endocrine, nutritional and metabolic disease: Secondary | ICD-10-CM

## 2022-09-06 DIAGNOSIS — G9332 Myalgic encephalomyelitis/chronic fatigue syndrome: Secondary | ICD-10-CM

## 2022-09-06 DIAGNOSIS — Z96642 Presence of left artificial hip joint: Secondary | ICD-10-CM

## 2022-09-06 DIAGNOSIS — Z96651 Presence of right artificial knee joint: Secondary | ICD-10-CM

## 2022-09-06 DIAGNOSIS — M4185 Other forms of scoliosis, thoracolumbar region: Secondary | ICD-10-CM

## 2022-09-06 DIAGNOSIS — M797 Fibromyalgia: Secondary | ICD-10-CM

## 2022-09-06 DIAGNOSIS — M19042 Primary osteoarthritis, left hand: Secondary | ICD-10-CM

## 2022-09-06 DIAGNOSIS — Z8269 Family history of other diseases of the musculoskeletal system and connective tissue: Secondary | ICD-10-CM

## 2022-09-06 DIAGNOSIS — Z8719 Personal history of other diseases of the digestive system: Secondary | ICD-10-CM

## 2022-09-06 DIAGNOSIS — M85851 Other specified disorders of bone density and structure, right thigh: Secondary | ICD-10-CM

## 2022-09-06 DIAGNOSIS — M349 Systemic sclerosis, unspecified: Secondary | ICD-10-CM | POA: Diagnosis not present

## 2022-09-06 DIAGNOSIS — G894 Chronic pain syndrome: Secondary | ICD-10-CM

## 2022-09-06 DIAGNOSIS — M5030 Other cervical disc degeneration, unspecified cervical region: Secondary | ICD-10-CM

## 2022-09-06 DIAGNOSIS — R7303 Prediabetes: Secondary | ICD-10-CM

## 2022-09-07 ENCOUNTER — Other Ambulatory Visit: Payer: Self-pay | Admitting: Family Medicine

## 2022-09-07 NOTE — Telephone Encounter (Signed)
Vit B12 Last filled:  06/27/22, #3 mL Last OV: 06/27/22, 3 mo pain f/u Next OV: 09/26/22, CPE

## 2022-09-12 ENCOUNTER — Other Ambulatory Visit: Payer: Self-pay | Admitting: Physician Assistant

## 2022-09-13 NOTE — Telephone Encounter (Signed)
Next Visit: 02/06/2023  Last Visit: 09/06/2022  Last Fill: 05/09/2022  Dx: [name] disease without gangrene   Current Dose per office note on 09/06/2021: Norvasc 7.5 mg QD   Okay to refill Amlodipine?

## 2022-09-22 ENCOUNTER — Ambulatory Visit (INDEPENDENT_AMBULATORY_CARE_PROVIDER_SITE_OTHER): Payer: Medicare Other

## 2022-09-22 VITALS — Wt 113.0 lb

## 2022-09-22 DIAGNOSIS — Z0000 Encounter for general adult medical examination without abnormal findings: Secondary | ICD-10-CM | POA: Diagnosis not present

## 2022-09-22 NOTE — Patient Instructions (Signed)
Rachel Duran , Thank you for taking time to come for your Medicare Wellness Visit. I appreciate your ongoing commitment to your health goals. Please review the following plan we discussed and let me know if I can assist you in the future.   These are the goals we discussed:  Goals      Increase physical activity     Starting 06/28/2018, I will continue to exercise 30-40 minutes 4 days per week.      Patient Stated     07/02/2019, I will try to start exercising more by doing some yoga in the future when my hip pain improves.      Patient Stated     Get more exercise         This is a list of the screening recommended for you and due dates:  Health Maintenance  Topic Date Due   COVID-19 Vaccine (5 - 2023-24 season) 07/20/2022   HIV Screening  06/21/2024*   Mammogram  01/07/2023   Medicare Annual Wellness Visit  09/22/2023   Pap Smear  04/29/2024   DTaP/Tdap/Td vaccine (3 - Tdap) 11/11/2026   Colon Cancer Screening  02/23/2028   Flu Shot  Completed   Hepatitis C Screening: USPSTF Recommendation to screen - Ages 18-79 yo.  Completed   Zoster (Shingles) Vaccine  Completed   HPV Vaccine  Aged Out  *Topic was postponed. The date shown is not the original due date.    Advanced directives: Please bring a copy of your health care power of attorney and living will to the office at your convenience.  Conditions/risks identified: get in more exercise   Next appointment: Follow up in one year for your annual wellness visit.   Preventive Care 40-64 Years, Female Preventive care refers to lifestyle choices and visits with your health care provider that can promote health and wellness. What does preventive care include? A yearly physical exam. This is also called an annual well check. Dental exams once or twice a year. Routine eye exams. Ask your health care provider how often you should have your eyes checked. Personal lifestyle choices, including: Daily care of your teeth and  gums. Regular physical activity. Eating a healthy diet. Avoiding tobacco and drug use. Limiting alcohol use. Practicing safe sex. Taking low-dose aspirin daily starting at age 39. Taking vitamin and mineral supplements as recommended by your health care provider. What happens during an annual well check? The services and screenings done by your health care provider during your annual well check will depend on your age, overall health, lifestyle risk factors, and family history of disease. Counseling  Your health care provider may ask you questions about your: Alcohol use. Tobacco use. Drug use. Emotional well-being. Home and relationship well-being. Sexual activity. Eating habits. Work and work Statistician. Method of birth control. Menstrual cycle. Pregnancy history. Screening  You may have the following tests or measurements: Height, weight, and BMI. Blood pressure. Lipid and cholesterol levels. These may be checked every 5 years, or more frequently if you are over 47 years old. Skin check. Lung cancer screening. You may have this screening every year starting at age 81 if you have a 30-pack-year history of smoking and currently smoke or have quit within the past 15 years. Fecal occult blood test (FOBT) of the stool. You may have this test every year starting at age 55. Flexible sigmoidoscopy or colonoscopy. You may have a sigmoidoscopy every 5 years or a colonoscopy every 10 years starting at age 65.  Hepatitis C blood test. Hepatitis B blood test. Sexually transmitted disease (STD) testing. Diabetes screening. This is done by checking your blood sugar (glucose) after you have not eaten for a while (fasting). You may have this done every 1-3 years. Mammogram. This may be done every 1-2 years. Talk to your health care provider about when you should start having regular mammograms. This may depend on whether you have a family history of breast cancer. BRCA-related cancer  screening. This may be done if you have a family history of breast, ovarian, tubal, or peritoneal cancers. Pelvic exam and Pap test. This may be done every 3 years starting at age 11. Starting at age 27, this may be done every 5 years if you have a Pap test in combination with an HPV test. Bone density scan. This is done to screen for osteoporosis. You may have this scan if you are at high risk for osteoporosis. Discuss your test results, treatment options, and if necessary, the need for more tests with your health care provider. Vaccines  Your health care provider may recommend certain vaccines, such as: Influenza vaccine. This is recommended every year. Tetanus, diphtheria, and acellular pertussis (Tdap, Td) vaccine. You may need a Td booster every 10 years. Zoster vaccine. You may need this after age 60. Pneumococcal 13-valent conjugate (PCV13) vaccine. You may need this if you have certain conditions and were not previously vaccinated. Pneumococcal polysaccharide (PPSV23) vaccine. You may need one or two doses if you smoke cigarettes or if you have certain conditions. Talk to your health care provider about which screenings and vaccines you need and how often you need them. This information is not intended to replace advice given to you by your health care provider. Make sure you discuss any questions you have with your health care provider. Document Released: 08/07/2015 Document Revised: 03/30/2016 Document Reviewed: 05/12/2015 Elsevier Interactive Patient Education  2017 [employer] Prevention in the Home Falls can cause injuries. They can happen to people of all ages. There are many things you can do to make your home safe and to help prevent falls. What can I do on the outside of my home? Regularly fix the edges of walkways and driveways and fix any cracks. Remove anything that might make you trip as you walk through a door, such as a raised step or threshold. Trim any  bushes or trees on the path to your home. Use bright outdoor lighting. Clear any walking paths of anything that might make someone trip, such as rocks or tools. Regularly check to see if handrails are loose or broken. Make sure that both sides of any steps have handrails. Any raised decks and porches should have guardrails on the edges. Have any leaves, snow, or ice cleared regularly. Use sand or salt on walking paths during winter. Clean up any spills in your garage right away. This includes oil or grease spills. What can I do in the bathroom? Use night lights. Install grab bars by the toilet and in the tub and shower. Do not use towel bars as grab bars. Use non-skid mats or decals in the tub or shower. If you need to sit down in the shower, use a plastic, non-slip stool. Keep the floor dry. Clean up any water that spills on the floor as soon as it happens. Remove soap buildup in the tub or shower regularly. Attach bath mats securely with double-sided non-slip rug tape. Do not have throw rugs and other things  on the floor that can make you trip. What can I do in the bedroom? Use night lights. Make sure that you have a light by your bed that is easy to reach. Do not use any sheets or blankets that are too big for your bed. They should not hang down onto the floor. Have a firm chair that has side arms. You can use this for support while you get dressed. Do not have throw rugs and other things on the floor that can make you trip. What can I do in the kitchen? Clean up any spills right away. Avoid walking on wet floors. Keep items that you use a lot in easy-to-reach places. If you need to reach something above you, use a strong step stool that has a grab bar. Keep electrical cords out of the way. Do not use floor polish or wax that makes floors slippery. If you must use wax, use non-skid floor wax. Do not have throw rugs and other things on the floor that can make you trip. What can I do  with my stairs? Do not leave any items on the stairs. Make sure that there are handrails on both sides of the stairs and use them. Fix handrails that are broken or loose. Make sure that handrails are as long as the stairways. Check any carpeting to make sure that it is firmly attached to the stairs. Fix any carpet that is loose or worn. Avoid having throw rugs at the top or bottom of the stairs. If you do have throw rugs, attach them to the floor with carpet tape. Make sure that you have a light switch at the top of the stairs and the bottom of the stairs. If you do not have them, ask someone to add them for you. What else can I do to help prevent falls? Wear shoes that: Do not have high heels. Have rubber bottoms. Are comfortable and fit you well. Are closed at the toe. Do not wear sandals. If you use a stepladder: Make sure that it is fully opened. Do not climb a closed stepladder. Make sure that both sides of the stepladder are locked into place. Ask someone to hold it for you, if possible. Clearly mark and make sure that you can see: Any grab bars or handrails. First and last steps. Where the edge of each step is. Use tools that help you move around (mobility aids) if they are needed. These include: Canes. Walkers. Scooters. Crutches. Turn on the lights when you go into a dark area. Replace any light bulbs as soon as they burn out. Set up your furniture so you have a clear path. Avoid moving your furniture around. If any of your floors are uneven, fix them. If there are any pets around you, be aware of where they are. Review your medicines with your doctor. Some medicines can make you feel dizzy. This can increase your chance of falling. Ask your doctor what other things that you can do to help prevent falls. This information is not intended to replace advice given to you by your health care provider. Make sure you discuss any questions you have with your health care  provider. Document Released: 05/07/2009 Document Revised: 12/17/2015 Document Reviewed: 08/15/2014 Elsevier Interactive Patient Education  2017 [employer].

## 2022-09-22 NOTE — Progress Notes (Signed)
I connected with  Kitten Baeder on 09/22/22 by a audio enabled telemedicine application and verified that I am speaking with the correct person using two identifiers.  Patient Location: Home  Provider Location: Office/Clinic  I discussed the limitations of evaluation and management by telemedicine. The patient expressed understanding and agreed to proceed.   Subjective:   Rachel Duran is a 67 y.o. female who presents for Medicare Annual (Subsequent) preventive examination.  Review of Systems     Cardiac Risk Factors include: advanced age (>66mn, >>33women);dyslipidemia;hypertension     Objective:    Today's Vitals   09/22/22 1556  Weight: 113 lb (51.3 kg)   Body mass index is 20.02 kg/m.     09/22/2022    4:03 PM 05/27/2022    2:13 PM 01/20/2022    8:58 AM 01/07/2022    2:08 PM 12/29/2021    9:00 PM 12/29/2021    9:46 AM 12/22/2021    1:25 PM  Advanced Directives  Does Patient Have a Medical Advance Directive? Yes Yes Yes Yes Yes  No;Yes  Type of AParamedicof APatriotLiving will HMarathonLiving will HPort AransasLiving will HWhittenLiving will HKickapoo Site 7Living will HLake WinnebagoLiving will HEspanolaLiving will  Does patient want to make changes to medical advance directive?   No - Patient declined No - Patient declined No - Patient declined    Copy of HStanfordin Chart? No - copy requested   Yes - validated most recent copy scanned in chart (See row information) Yes - validated most recent copy scanned in chart (See row information) Yes - validated most recent copy scanned in chart (See row information) No - copy requested  Would patient like information on creating a medical advance directive?    No - Patient declined No - Patient declined  No - Patient declined    Current Medications (verified) Outpatient  Encounter Medications as of 09/22/2022  Medication Sig   ALPRAZolam (XANAX) 1 MG tablet Take 0.5 tablets (0.5 mg total) by mouth at bedtime.   amLODipine (NORVASC) 2.5 MG tablet TAKE 1 TABLET BY MOUTH DAILY   amLODipine (NORVASC) 5 MG tablet TAKE 1 TABLET BY MOUTH DAILY   amphetamine-dextroamphetamine (ADDERALL XR) 30 MG 24 hr capsule TAKE 1 CAPSUEL BY MOUTH EACH MORNING   ARIPiprazole (ABILIFY) 5 MG tablet Take 5 mg by mouth at bedtime.   Calcium Carbonate-Vitamin D (CALTRATE 600+D PO) Take 2 tablets by mouth daily.   cyanocobalamin (VITAMIN B12) 1000 MCG/ML injection INJECT 1ML INTO THE MUSCLE EVERY 21 DAYSAS DIRECTED   Eszopiclone 3 MG TABS TAKE 1 TABLET BY MOUTH AT BEDTIME IMMEDIATLEY AS DIRECTED.   ferrous sulfate 325 (65 FE) MG tablet Take 1 tablet (325 mg total) by mouth every Monday, Wednesday, and Friday.   lamoTRIgine (LAMICTAL) 200 MG tablet Take 1 tablet (200 mg total) by mouth daily.   lidocaine (LIDODERM) 5 % Place 3 patches onto the skin daily. Remove & Discard patch within 12 hours or as directed by MD   lidocaine (XYLOCAINE) 5 % ointment Apply 1 Application topically as needed. Apply up to 4x/day- on LUE for nerve pain   linaclotide (LINZESS) 290 MCG CAPS capsule Take 1 capsule (290 mcg total) by mouth daily.   morphine (MSIR) 15 MG tablet TAKE ONE TABLET BY MOUTH EVERY 6 HOURS AS NEEDED FOR FOR SEVERE PAIN   naloxegol oxalate (MOVANTIK) 25  MG TABS tablet TAKE ONE TABLET BY MOUTH EVERY DAY   NONFORMULARY OR COMPOUNDED ITEM Testosterone proprionate petrolatum (jar) 2% ointment  2 Grams S:  apply a small amount once daily x 5 days per week.   ondansetron (ZOFRAN) 4 MG tablet TAKE 1 TABLET BY MOUTH EVERY 8 HOURS AS NEEDED FOR NAUSEA AND VOMITING.   ondansetron (ZOFRAN-ODT) 4 MG disintegrating tablet Take 4 mg by mouth every 6 (six) hours as needed for nausea or vomiting.   pantoprazole (PROTONIX) 40 MG tablet TAKE ONE TABLET TWICE A DAY WITH MEALS   polyethylene glycol (MIRALAX /  GLYCOLAX) 17 g packet Take 17 g by mouth 2 (two) times daily.   Prasterone (INTRAROSA) 6.5 MG INST UNWRAP AND INSERT 1 SUPPOSITORY  VAGINALLY WITH PROVIDED  APPLICATOR AT BEDTIME   pregabalin (LYRICA) 150 MG capsule Take 150 mg by mouth 2 (two) times daily.   ursodiol (ACTIGALL) 300 MG capsule TAKE 1 CAPSULE BY MOUTH TWICE DAILY   clindamycin (CLEOCIN) 150 MG capsule Take '600mg'$  1 hour prior to dental procedure   No facility-administered encounter medications on file as of 09/22/2022.    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 C6- C7 -hx. past fusion(bone graft used)   Cholecystitis    Chronic pain    DDD (degenerative disc disease), lumbar 09/2015   dextroscoliosis with multilevel DDD and facet arthrosis most notable for R foraminal disc protrusion L4/5 producing severe R neural foraminal stenosis abutting R L4 nerve root, moderate spinal canal and mild lat recesss and R neural foraminal stenosis L3/4 (MRI)   Depression    bipolar depression   Disorders of porphyrin metabolism    Felon of finger of left hand 11/10/2016   Fibromyalgia    GERD (gastroesophageal reflux disease)    Headache    occasionally   Internal hemorrhoids    Interstitial cystitis 06/06/2012   hx.   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 (Dr Baird Cancer retinologist)   Tuberculosis    Past Surgical History:  Procedure Laterality Date   ANTERIOR CERVICAL DECOMP/DISCECTOMY FUSION  2004   C5/6, C6/7   ANTERIOR CERVICAL DECOMP/DISCECTOMY FUSION  02/2016   C3/4, C4/5 with plating [name])   ANTERIOR CERVICAL DECOMP/DISCECTOMY FUSION  12/29/2021   Procedure: ANTERIOR CERVICAL DECOMPRESSION/ DISCECTOMY FUSION CERVICAL THREE - SEVEN;  Surgeon: Newman Pies, MD;  Location: [facility];  Service: Neurosurgery;;   ANTERIOR CERVICAL DECOMPRESSION/DISCECTOMY FUSION 4 LEVELS N/A 12/29/2021   Procedure: CERVICAL FIVE-SIX CORPECTOMY,;  Surgeon: Newman Pies, MD;  Location: [facility];  Service: Neurosurgery;  Laterality: N/A;   AUGMENTATION MAMMAPLASTY Bilateral 03/25/2010   BREAST ENHANCEMENT SURGERY  2010   BREAST IMPLANT EXCHANGE  10/2014   exchange saline implants, B mastopexy/capsulorraphy ([facility])   BUNIONECTOMY Bilateral yrs ago   [place]   x 1   CHOLECYSTECTOMY  06/11/2012   Procedure: LAPAROSCOPIC CHOLECYSTECTOMY WITH INTRAOPERATIVE CHOLANGIOGRAM;  Surgeon: [name], MD,FACS;  Location: [facility];  Service: General;  Laterality: N/A;   COLONOSCOPY  02/2018   done for positive cologuard - WNL, rpt 10 yrs Fuller Plan)   CYSTOSCOPY     ERCP  05/22/2012   Procedure: ENDOSCOPIC RETROGRADE CHOLANGIOPANCREATOGRAPHY (ERCP);  Surgeon: [name], MD,FACG;  [facility];  Service: Endoscopy;  Laterality: N/A;   ERCP N/A 09/17/2013   Procedure: ENDOSCOPIC RETROGRADE CHOLANGIOPANCREATOGRAPHY (ERCP);  Surgeon: [name], MD;  Location[facility];  Service: Endoscopy;  Laterality: N/A;   ERCP N/A 09/27/2018   Procedure: ENDOSCOPIC RETROGRADE  CHOLANGIOPANCREATOGRAPHY (ERCP);  Surgeon: [name], MD;  Location[facility];  Service: Endoscopy;  Laterality: N/A;   ESOPHAGOGASTRODUODENOSCOPY  09/2016   WNL. esophagus dilated Fuller Plan)   HEMORRHOID BANDING  09/23/2013   --Dr. Greer Pickerel   HERNIA REPAIR     inguinal   HYSTEROSCOPY W/ ENDOMETRIAL ABLATION     KNEE ARTHROSCOPY Right 04/06/2021   Procedure: RIGHT KNEE ARTHROSCOPY WITH DEBRIDEMENT;  Surgeon: [name], MD;  Location: [facility];  Service: Orthopedics;  Laterality: Right;   NASAL SINUS SURGERY     x5   PARTIAL KNEE ARTHROPLASTY Right 06/01/2020   Procedure: Right knee patellofemoral replacement;  Surgeon: [name], MD;  Location: [facility];  Service: Orthopedics;  Laterality: Right;   POSTERIOR CERVICAL FUSION/FORAMINOTOMY N/A 12/29/2021   Procedure: POSTERIOR CERVICAL FUSION CERVICAL THREE-SEVEN;  Surgeon: Newman Pies, MD;  Location: [facility];  Service: Neurosurgery;  Laterality: N/A;   REMOVAL OF STONES  09/27/2018   Procedure: REMOVAL OF STONES;  Surgeon: [name], MD;  [facility];  Service: Endoscopy;;   SPHINCTEROTOMY  09/27/2018   Procedure: SPHINCTEROTOMY;  Surgeon: [name], MD;  [facility];  Service: Endoscopy;;   TONSILLECTOMY     removed as a child   TOTAL HIP ARTHROPLASTY Left 03/12/2019   Procedure: LEFT TOTAL HIP ARTHROPLASTY ANTERIOR APPROACH;  [name], [name], MD)   UPPER GASTROINTESTINAL ENDOSCOPY     Family History  Problem Relation Age of Onset   CAD Father 43       MI, nonsmoker   Esophageal cancer Father 67   Stomach cancer Father    Scleroderma Mother    Hypertension Mother    Esophageal cancer Paternal Grandfather    Stomach cancer Paternal Grandfather    Diabetes Maternal Grandmother    Arthritis Brother    Stroke Neg Hx    Colon cancer Neg Hx    Rectal cancer Neg Hx    Social History   Socioeconomic History   Marital  status: Married    Spouse name: Not on file   Number of children: Not on file   Years of education: Not on file   Highest education level: Not on file  Occupational History   Occupation: retired    Fish farm manager: UNEMPLOYED  Tobacco Use   Smoking status: Never    Passive exposure: Never   Smokeless tobacco: Never  Vaping Use   Vaping Use: Never used  Substance and Sexual  Activity   Alcohol use: No    Alcohol/week: 0.0 standard drinks of alcohol   Drug use: No   Sexual activity: Yes    Partners: Male    Birth control/protection: Post-menopausal    Comment: vasectomy, 1st intercourse- 35 y.o., partner- 1, DES exposure unknown  Other Topics Concern   Not on file  Social History Narrative   Lives with husband and dog   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   Activity: tries to walk dog (45 min 3x/wk), yoga   Diet: good water, fruits/vegetables daily      Rheum: [name]   Psychiatrist: Kaur   Surgery: Redmond Pulling   GIFuller Plan   Urology: Amalia Hailey   Social Determinants of Health   Financial Resource Strain: Low Risk  (09/22/2022)   Overall Financial Resource Strain (CARDIA)    Difficulty of Paying Living Expenses: Not hard at all  Food Insecurity: No Food Insecurity (09/22/2022)   Hunger Vital Sign    Worried About Running Out of Food in the Last Year: Never true    [place] in the Last Year: Never true  Transportation Needs: No Transportation Needs (09/22/2022)   PRAPARE - Hydrologist (Medical): No    Lack of Transportation (Non-Medical): No  Physical Activity: Inactive (09/22/2022)   Exercise Vital Sign    Days of Exercise per Week: 0 days    Minutes of Exercise per Session: 0 min  Stress: No Stress Concern Present (09/22/2022)   [place]    Feeling of Stress : Not at all  Social Connections: Moderately Integrated (09/22/2022)   Social  Connection and Isolation Panel [NHANES]    Frequency of Communication with Friends and Family: More than three times a week    Frequency of Social Gatherings with Friends and Family: More than three times a week    Attends Religious Services: More than 4 times per year    Active Member of [employer] or Organizations: No    Attends Music therapist: Never    Marital Status: Married    Tobacco Counseling Counseling given: Not Answered   Clinical Intake:  Pre-visit preparation completed: Yes  Pain : No/denies pain     BMI - recorded: 20.02 Nutritional Status: BMI of 19-24  Normal Nutritional Risks: None Diabetes: No  How often do you need to have someone help you when you read instructions, pamphlets, or other written materials from your doctor or pharmacy?: 1 - Never  Diabetic?no  Interpreter Needed?: No  Information entered by :: Charlott Rakes, LPN   Activities of Daily Living    09/22/2022    4:05 PM 01/07/2022    2:15 PM  In your present state of health, do you have any difficulty performing the following activities:  Hearing? 0   Vision? 0   Difficulty concentrating or making decisions? 0   Walking or climbing stairs? 0   Dressing or bathing? 0   Doing errands, shopping? 0 0  Preparing Food and eating ? N   Using the Toilet? N   In the past six months, have you accidently leaked urine? N   Do you have problems with loss of bowel control? N   Managing your Medications? N   Managing your Finances? N   Housekeeping or managing your Housekeeping? N     Patient Care Team: Ria Bush, MD as PCP - General (Family Medicine) Fuller Plan,  Pricilla Riffle, MD as Consulting Physician (Gastroenterology) Jari Pigg, MD as Consulting Physician (Dermatology) Sherlynn Stalls, MD as Consulting Physician (Ophthalmology) Luberta Mutter, MD as Consulting Physician (Ophthalmology) Domingo Pulse, MD as Referring Physician (Urology) Bo Merino, MD as Consulting  Physician (Rheumatology) Newman Pies, MD as Consulting Physician (Neurosurgery) Yisroel Ramming, Everardo All, MD as Consulting Physician (Obstetrics and Gynecology)  Indicate any recent Medical Services you may have received from other than [facility] providers in the past year (date may be approximate).     Assessment:   This is a routine wellness examination for [facility].  Hearing/Vision screen Hearing Screening - Comments:: Pt denies any hearing  Vision Screening - Comments:: Pt follows up with Dr Ellie Lunch for annual eye exams   Dietary issues and exercise activities discussed: Current Exercise Habits: The patient does not participate in regular exercise at present   Goals Addressed             This Visit's Progress    Patient Stated       Get more exercise        Depression Screen    09/22/2022    4:00 PM 03/14/2022    1:51 PM 01/26/2022   11:22 AM 09/21/2021   12:46 PM 08/17/2021   10:54 AM 10/13/2020   11:24 AM 07/10/2020   12:15 PM  PHQ 2/9 Scores  PHQ - 2 Score 1 0 0 0 0 0 0  PHQ- 9 Score   '1 3 6 2 5    '$ Fall Risk    09/22/2022    4:04 PM 03/14/2022    1:58 PM 01/26/2022   11:21 AM 09/21/2021   12:02 PM 07/10/2020   11:25 AM  Fall Risk   Falls in the past year? 1 0 0 0 0  Number falls in past yr: 1  0    Injury with Fall? 1  0    Comment neck broken      Risk for fall due to : History of fall(s);Impaired vision      Follow up Falls prevention discussed        FALL RISK PREVENTION PERTAINING TO THE HOME:  Any stairs in or around the home? Yes  If so, are there any without handrails? No  Home free of loose throw rugs in walkways, pet beds, electrical cords, etc? Yes  Adequate lighting in your home to reduce risk of falls? Yes   ASSISTIVE DEVICES UTILIZED TO PREVENT FALLS:  Life alert? No  Use of a cane, walker or w/c? No  Grab bars in the bathroom? Yes  Shower chair or bench in shower? Yes  Elevated toilet seat or a handicapped toilet? Yes   TIMED UP  AND GO:  Was the test performed? No .    Cognitive Function:    07/02/2019   11:24 AM 06/28/2018   10:37 AM 06/21/2017    9:42 AM  MMSE - Mini Mental State Exam  Orientation to time '5 5 5  '$ Orientation to Place '5 5 5  '$ Registration '3 3 3  '$ Attention/ Calculation 5 0 0  Recall '3 3 3  '$ Language- name 2 objects  0 0  Language- repeat '1 1 1  '$ Language- follow 3 step command  3 3  Language- read & follow direction  0 0  Write a sentence  0 0  Copy design  0 0  Total score  20 20        09/22/2022    4:06 PM  6CIT Screen  What Year? 0 points  What month? 0 points  What time? 0 points  Count back from 20 0 points  Months in reverse 0 points  Repeat phrase 0 points  Total Score 0 points    Immunizations Immunization History  Administered Date(s) Administered   COVID-19, mRNA, vaccine(Comirnaty)12 years and older 05/25/2022   Influenza Split 05/04/2011, 04/04/2012   Influenza Whole 05/25/2007, 04/28/2008, 04/30/2009, 03/26/2010   Influenza,inj,Quad PF,6+ Mos 04/16/2015, 04/15/2016, 05/16/2017, 04/10/2018, 04/19/2019, 04/07/2020, 04/20/2022   Influenza-Unspecified 03/25/2014   MMR 11/28/2017   Moderna Sars-Covid-2 Vaccination 09/02/2019, 09/30/2019, 04/07/2020   Pneumococcal Conjugate-13 08/02/2013   Pneumococcal Polysaccharide-23 07/30/2009   Td 11/01/2005, 11/10/2016   Zoster Recombinat (Shingrix) 07/21/2017, 11/03/2017    TDAP status: Up to date  Flu Vaccine status: Up to date    Covid-19 vaccine status: Completed vaccines  Qualifies for Shingles Vaccine? Yes   Zostavax completed Yes   Shingrix Completed?: Yes  Screening Tests Health Maintenance  Topic Date Due   COVID-19 Vaccine (5 - 2023-24 season) 07/20/2022   HIV Screening  06/21/2024 (Originally 01/22/1971)   MAMMOGRAM  01/07/2023   Medicare Annual Wellness (AWV)  09/22/2023   PAP SMEAR-Modifier  04/29/2024   DTaP/Tdap/Td (3 - Tdap) 11/11/2026   COLONOSCOPY (Pts 45-33yr Insurance coverage will need to  be confirmed)  02/23/2028   INFLUENZA VACCINE  Completed   Hepatitis C Screening  Completed   Zoster Vaccines- Shingrix  Completed   HPV VACCINES  Aged Out    Health Maintenance  Health Maintenance Due  Topic Date Due   COVID-19 Vaccine (5 - 2023-24 season) 07/20/2022    Colorectal cancer screening: Type of screening: Colonoscopy. Completed 02/22/18. Repeat every 10 years  Mammogram status: Completed 01/06/21. Repeat every year    Additional Screening:  Hepatitis C Screening:  Completed 07/16/15  Vision Screening: Recommended annual ophthalmology exams for early detection of glaucoma and other disorders of the eye. Is the patient up to date with their annual eye exam?  Yes  Who is the provider or what is the name of the office in which the patient attends annual eye exams? DR MEllie Lunch If pt is not established with a provider, would they like to be referred to a provider to establish care? No .   Dental Screening: Recommended annual dental exams for proper oral hygiene  Community Resource Referral / Chronic Care Management: CRR required this visit?  No   CCM required this visit?  No      Plan:     I have personally reviewed and noted the following in the patient's chart:   Medical and social history Use of alcohol, tobacco or illicit drugs  Current medications and supplements including opioid prescriptions. Patient is currently taking opioid prescriptions. Information provided to patient regarding non-opioid alternatives. Patient advised to discuss non-opioid treatment plan with their provider. Functional ability and status Nutritional status Physical activity Advanced directives List of other physicians Hospitalizations, surgeries, and ER visits in previous 12 months Vitals Screenings to include cognitive, depression, and falls Referrals and appointments  In addition, I have reviewed and discussed with patient certain preventive protocols, quality metrics, and best  practice recommendations. A written personalized care plan for preventive services as well as general preventive health recommendations were provided to patient.     TWillette Brace LPN   [id number]  Nurse Notes: none

## 2022-09-24 NOTE — Patient Instructions (Signed)
[email or web address] Labs today.  Good to see you today.  Return as needed in 3 months for follow up visit.

## 2022-09-24 NOTE — Progress Notes (Unsigned)
Patient ID: Rachel Duran, female    DOB: 12/25/1955, 67 y.o.   MRN: [id number]  This visit was conducted in person.  LMP 07/25/1998    CC: CPE Subjective:   HPI: Rachel Duran is a 67 y.o. female presenting on 09/26/2022 for No chief complaint on file.   Saw health advisor last week for medicare wellness visit. Note reviewed.   No results found.  Flowsheet Row Clinical Support from 09/22/2022 in [facility] [place]  PHQ-2 Total Score 1          09/22/2022    4:04 PM 03/14/2022    1:58 PM 01/26/2022   11:21 AM 09/21/2021   12:02 PM 07/10/2020   11:25 AM  Fall Risk   Falls in the past year? 1 0 0 0 0  Number falls in past yr: 1  0    Injury with Fall? 1  0    Comment neck broken      Risk for fall due to : History of fall(s);Impaired vision      Follow up Falls prevention discussed        Caring for grandchildren after daughter was killed 05/2022.    S/p partial R knee replacement with PF arthroplasty (Dr Marlou Sa) 06/01/2020 then rpt arthroscopy with debridement of inferior patellar pole spur 03/2021 - she is able to walk several times a week. Continues to struggle with stairs.    12/2021 - complex neck surgery by Dr [name] for C5/6 fracture subluxation after fall in prior cervical fusion. Seeing [facility] for persistent cervical radiculopathy after fusion.   Limited systemic sclerosis/scleroderma followed by rheum Dr Estanislado Pandy. Saw pulm and cards with reassuring evaluations.   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    Preventative: COLONOSCOPY 02/2018 - done for positive cologuard - WNL, rpt 10 yrs Fuller Plan).  Well woman yearly with Dr Josefa Half OBGYN 04/2021 with normal pap smear.   [facility] - 2014 with mild osteopenia per GYN DEX[facility] - 04/2019 stable osteopenia (T -2.0). upcoming rpt 09/2021.  Doing ok with calcium and vitamin  D intake, limited weight bearing exercises.  Lung cancer screening - not eligible Flu shot - yearly COVID vaccine Moderna 08/2019, 09/2019, booster 03/2020 may get bivalent   Pneumovax 2011, prevnar-13 2015 Td 2007, Td 10/2016 Shingrix 06/2017, 10/2017  Advanced care planning - HCPOA scanned into chart 06/2015. HCPOA is husband Jazzie Syx. Does not want prolonged life support if terminally ill.  Seat belt use discussed.  Sunscreen use discussed. No changing moles. Sees derm yearly Delman Cheadle).  Non smoker  Alcohol - none  Dentist q6 mo  Eye exam frequently Bowel - chronic constipation - on movantik with good effect -'25mg'$  daily (12.'5mg'$  not effective).  Bladder - no incontinence, known IC yearly followed by Dr Amalia Hailey doing well on elmiron and PRN pyridium.     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 Interim medical history since our last visit reviewed. Allergies and medications reviewed and updated. Outpatient Medications Prior to Visit  Medication Sig Dispense Refill   ALPRAZolam (XANAX) 1 MG tablet Take 0.5 tablets (0.5 mg total) by mouth at bedtime. [place]  tablet 0   amLODipine (NORVASC) 2.5 MG tablet TAKE 1 TABLET BY MOUTH DAILY 90 tablet 0   amLODipine (NORVASC) 5 MG tablet TAKE 1 TABLET BY MOUTH DAILY 90 tablet 0   amphetamine-dextroamphetamine (ADDERALL XR) 30 MG 24 hr capsule TAKE 1 CAPSUEL BY MOUTH EACH MORNING 90 capsule 0   ARIPiprazole (ABILIFY) 5 MG tablet Take 5 mg by mouth at bedtime.     Calcium Carbonate-Vitamin D (CALTRATE 600+D PO) Take 2 tablets by mouth daily.     clindamycin (CLEOCIN) 150 MG capsule Take '600mg'$  1 hour prior to dental procedure 10 capsule 0   cyanocobalamin (VITAMIN B12) 1000 MCG/ML injection INJECT 1ML INTO THE  MUSCLE EVERY 21 DAYSAS DIRECTED 3 mL 3   Eszopiclone 3 MG TABS TAKE 1 TABLET BY MOUTH AT BEDTIME IMMEDIATLEY AS DIRECTED. 90 tablet 0   ferrous sulfate 325 (65 FE) MG tablet Take 1 tablet (325 mg total) by mouth every Monday, Wednesday, and Friday. 30 tablet 3   lamoTRIgine (LAMICTAL) 200 MG tablet Take 1 tablet (200 mg total) by mouth daily. 30 tablet 0   lidocaine (LIDODERM) 5 % Place 3 patches onto the skin daily. Remove & Discard patch within 12 hours or as directed by MD 60 patch 0   lidocaine (XYLOCAINE) 5 % ointment Apply 1 Application topically as needed. Apply up to 4x/day- on LUE for nerve pain 50 g 5   linaclotide (LINZESS) 290 MCG CAPS capsule Take 1 capsule (290 mcg total) by mouth daily. 90 capsule 2   morphine (MSIR) 15 MG tablet TAKE ONE TABLET BY MOUTH EVERY 6 HOURS AS NEEDED FOR FOR SEVERE PAIN 90 tablet 0   naloxegol oxalate (MOVANTIK) 25 MG TABS tablet TAKE ONE TABLET BY MOUTH EVERY DAY 90 tablet 0   NONFORMULARY OR COMPOUNDED ITEM Testosterone proprionate petrolatum (jar) 2% ointment  2 Grams S:  apply a small amount once daily x 5 days per week. 2 each 0   ondansetron (ZOFRAN) 4 MG tablet TAKE 1 TABLET BY MOUTH EVERY 8 HOURS AS NEEDED FOR NAUSEA AND VOMITING. 30 tablet 0   ondansetron (ZOFRAN-ODT) 4 MG disintegrating tablet Take 4 mg by mouth every 6 (six) hours as needed for nausea or vomiting.     pantoprazole (PROTONIX) 40 MG tablet TAKE ONE TABLET TWICE A DAY WITH MEALS 180 tablet 3   polyethylene glycol (MIRALAX / GLYCOLAX) 17 g packet Take 17 g by mouth 2 (two) times daily.     Prasterone (INTRAROSA) 6.5 MG INST UNWRAP AND INSERT 1 SUPPOSITORY  VAGINALLY WITH PROVIDED  APPLICATOR AT BEDTIME 90 each 0   pregabalin (LYRICA) 150 MG capsule Take 150 mg by mouth 2 (two) times daily.     ursodiol (ACTIGALL) 300 MG capsule TAKE 1 CAPSULE BY MOUTH TWICE DAILY 180 capsule 3   No facility-administered medications prior to visit.     Per HPI unless specifically indicated in ROS  section below Review of Systems  Objective:  LMP 07/25/1998   Wt Readings from Last 3 Encounters:  09/22/22 113 lb (51.3 kg)  09/06/22 113 lb 12.8 oz (51.6 kg)  06/29/22 119 lb 9.6 oz (54.3 kg)      Physical Exam    Results for orders placed or performed during the hospital encounter of 06/29/22  ECHOCARDIOGRAM COMPLETE  Result Value Ref Range   S' Lateral 3.80 cm   Area-P 1/2 2.56 cm2   *Note: Due to a large number of results and/or encounters for the requested time period,  some results have not been displayed. A complete set of results can be found in Results Review.    Assessment & Plan:   Problem List Items Addressed This Visit   None    No orders of the defined types were placed in this encounter.   No orders of the defined types were placed in this encounter.   Patient Instructions  [email or web address]   Follow up plan: No follow-ups on file.  Ria Bush, MD

## 2022-09-26 ENCOUNTER — Ambulatory Visit (INDEPENDENT_AMBULATORY_CARE_PROVIDER_SITE_OTHER): Payer: Medicare Other | Admitting: Family Medicine

## 2022-09-26 ENCOUNTER — Encounter: Payer: Self-pay | Admitting: Family Medicine

## 2022-09-26 VITALS — BP 138/78 | HR 89 | Temp 97.4°F | Ht 62.5 in | Wt 116.5 lb

## 2022-09-26 DIAGNOSIS — Z7189 Other specified counseling: Secondary | ICD-10-CM

## 2022-09-26 DIAGNOSIS — E611 Iron deficiency: Secondary | ICD-10-CM | POA: Diagnosis not present

## 2022-09-26 DIAGNOSIS — K831 Obstruction of bile duct: Secondary | ICD-10-CM

## 2022-09-26 DIAGNOSIS — M85851 Other specified disorders of bone density and structure, right thigh: Secondary | ICD-10-CM | POA: Diagnosis not present

## 2022-09-26 DIAGNOSIS — D649 Anemia, unspecified: Secondary | ICD-10-CM

## 2022-09-26 DIAGNOSIS — G8929 Other chronic pain: Secondary | ICD-10-CM

## 2022-09-26 DIAGNOSIS — R946 Abnormal results of thyroid function studies: Secondary | ICD-10-CM

## 2022-09-26 DIAGNOSIS — E785 Hyperlipidemia, unspecified: Secondary | ICD-10-CM

## 2022-09-26 DIAGNOSIS — G4700 Insomnia, unspecified: Secondary | ICD-10-CM

## 2022-09-26 DIAGNOSIS — M792 Neuralgia and neuritis, unspecified: Secondary | ICD-10-CM

## 2022-09-26 DIAGNOSIS — R7303 Prediabetes: Secondary | ICD-10-CM | POA: Diagnosis not present

## 2022-09-26 DIAGNOSIS — Z0000 Encounter for general adult medical examination without abnormal findings: Secondary | ICD-10-CM

## 2022-09-26 DIAGNOSIS — M349 Systemic sclerosis, unspecified: Secondary | ICD-10-CM

## 2022-09-26 DIAGNOSIS — F331 Major depressive disorder, recurrent, moderate: Secondary | ICD-10-CM

## 2022-09-26 DIAGNOSIS — I7300 Raynaud's syndrome without gangrene: Secondary | ICD-10-CM

## 2022-09-26 DIAGNOSIS — K219 Gastro-esophageal reflux disease without esophagitis: Secondary | ICD-10-CM

## 2022-09-26 DIAGNOSIS — S42035D Nondisplaced fracture of lateral end of left clavicle, subsequent encounter for fracture with routine healing: Secondary | ICD-10-CM

## 2022-09-26 DIAGNOSIS — H3553 Other dystrophies primarily involving the sensory retina: Secondary | ICD-10-CM

## 2022-09-26 DIAGNOSIS — M797 Fibromyalgia: Secondary | ICD-10-CM

## 2022-09-26 DIAGNOSIS — N3010 Interstitial cystitis (chronic) without hematuria: Secondary | ICD-10-CM

## 2022-09-26 DIAGNOSIS — G894 Chronic pain syndrome: Secondary | ICD-10-CM

## 2022-09-26 DIAGNOSIS — M47812 Spondylosis without myelopathy or radiculopathy, cervical region: Secondary | ICD-10-CM

## 2022-09-26 DIAGNOSIS — G9332 Myalgic encephalomyelitis/chronic fatigue syndrome: Secondary | ICD-10-CM

## 2022-09-26 DIAGNOSIS — K5904 Chronic idiopathic constipation: Secondary | ICD-10-CM

## 2022-09-26 DIAGNOSIS — F4321 Adjustment disorder with depressed mood: Secondary | ICD-10-CM

## 2022-09-26 DIAGNOSIS — M419 Scoliosis, unspecified: Secondary | ICD-10-CM

## 2022-09-26 LAB — LIPID PANEL
Cholesterol: 177 mg/dL (ref 0–200)
HDL: 76.1 mg/dL (ref >39.00)
LDL Cholesterol: 82 mg/dL (ref 0–99)
NonHDL: 101.32
Total CHOL/HDL Ratio: 2
Triglycerides: 95 mg/dL (ref 0.0–149.0)
VLDL: 19 mg/dL (ref 0.0–40.0)

## 2022-09-26 LAB — CBC WITH DIFFERENTIAL/PLATELET
Basophils Absolute: 0.1 10*3/uL (ref 0.0–0.1)
Basophils Relative: 0.7 % (ref 0.0–3.0)
Eosinophils Absolute: 0.1 10*3/uL (ref 0.0–0.7)
Eosinophils Relative: 1.2 % (ref 0.0–5.0)
HCT: 37.1 % (ref 36.0–46.0)
Hemoglobin: 12.5 g/dL (ref 12.0–15.0)
Lymphocytes Relative: 18.3 % (ref 12.0–46.0)
Lymphs Abs: 1.6 10*3/uL (ref 0.7–4.0)
MCHC: 33.6 g/dL (ref 30.0–36.0)
MCV: 84.4 fl (ref 78.0–100.0)
Monocytes Absolute: 0.5 10*3/uL (ref 0.1–1.0)
Monocytes Relative: 5.8 % (ref 3.0–12.0)
Neutro Abs: 6.3 10*3/uL (ref 1.4–7.7)
Neutrophils Relative %: 74 % (ref 43.0–77.0)
Platelets: 320 10*3/uL (ref 150.0–400.0)
RBC: 4.4 Mil/uL (ref 3.87–5.11)
RDW: 14.3 % (ref 11.5–15.5)
WBC: 8.5 10*3/uL (ref 4.0–10.5)

## 2022-09-26 LAB — COMPREHENSIVE METABOLIC PANEL
ALT: 9 U/L (ref 0–35)
AST: 17 U/L (ref 0–37)
Albumin: 4.1 g/dL (ref 3.5–5.2)
Alkaline Phosphatase: 108 U/L (ref 39–117)
BUN: 7 mg/dL (ref 6–23)
CO2: 29 mEq/L (ref 19–32)
Calcium: 9.2 mg/dL (ref 8.4–10.5)
Chloride: 98 mEq/L (ref 96–112)
Creatinine, Ser: 0.48 mg/dL (ref 0.40–1.20)
GFR: 99.92 mL/min (ref >60.00)
Glucose, Bld: 95 mg/dL (ref 70–99)
Potassium: 4.8 mEq/L (ref 3.5–5.1)
Sodium: 134 mEq/L — ABNORMAL LOW (ref 135–145)
Total Bilirubin: 0.4 mg/dL (ref 0.2–1.2)
Total Protein: 6.4 g/dL (ref 6.0–8.3)

## 2022-09-26 LAB — HEMOGLOBIN A1C: Hgb A1c MFr Bld: 6 % (ref 4.6–6.5)

## 2022-09-26 LAB — IBC PANEL
Iron: 56 ug/dL (ref 42–145)
Saturation Ratios: 13.8 % — ABNORMAL LOW (ref 20.0–50.0)
TIBC: 404.6 ug/dL (ref 250.0–450.0)
Transferrin: 289 mg/dL (ref 212.0–360.0)

## 2022-09-26 LAB — VITAMIN D 25 HYDROXY (VIT D DEFICIENCY, FRACTURES): VITD: 30.22 ng/mL (ref 30.00–100.00)

## 2022-09-26 LAB — T4, FREE: Free T4: 0.84 ng/dL (ref 0.60–1.60)

## 2022-09-26 LAB — FERRITIN: Ferritin: 40.7 ng/mL (ref 10.0–291.0)

## 2022-09-26 LAB — TSH: TSH: 1.52 u[IU]/mL (ref 0.35–5.50)

## 2022-09-26 MED ORDER — AMPHETAMINE-DEXTROAMPHET ER 30 MG PO CP24: ORAL_CAPSULE | ORAL | 0 refills | Status: DC

## 2022-09-26 MED ORDER — ONDANSETRON HCL 4 MG PO TABS: ORAL_TABLET | ORAL | 0 refills | Status: DC

## 2022-09-26 MED ORDER — ESZOPICLONE 3 MG PO TABS: 3.0000 mg | ORAL_TABLET | Freq: Every day | ORAL | 0 refills | Status: DC

## 2022-09-26 MED ORDER — MORPHINE SULFATE 15 MG PO TABS: ORAL_TABLET | ORAL | 0 refills | Status: DC

## 2022-09-26 NOTE — Assessment & Plan Note (Signed)
Discussing new injectable medication by eye doctor.

## 2022-09-26 NOTE — Assessment & Plan Note (Signed)
Update levels on MWF oral replacement.

## 2022-09-26 NOTE — Assessment & Plan Note (Signed)
This has healed without surgical intervention.

## 2022-09-26 NOTE — Assessment & Plan Note (Signed)
Continues Lunesta - refilled.

## 2022-09-26 NOTE — Assessment & Plan Note (Signed)
Update TSH, fT4.

## 2022-09-26 NOTE — Assessment & Plan Note (Signed)
Sees rheum, cards, pulm. Appreciate their care.

## 2022-09-26 NOTE — Assessment & Plan Note (Signed)
Continues linzess and movantik .

## 2022-09-26 NOTE — Assessment & Plan Note (Signed)
Followed by psychiatrist.

## 2022-09-26 NOTE — Assessment & Plan Note (Signed)
Appreciate neurosurgery and PM&R care.

## 2022-09-26 NOTE — Assessment & Plan Note (Signed)
Continue amlodipine 2.'5mg'$  through rheum

## 2022-09-26 NOTE — Assessment & Plan Note (Signed)
Preventative protocols reviewed and updated unless pt declined. Discussed healthy diet and lifestyle.  

## 2022-09-26 NOTE — Assessment & Plan Note (Addendum)
Followed by Rachel Duran urology. Now off elmiron (which may worsen eye disease/macular degeneration)

## 2022-09-26 NOTE — Assessment & Plan Note (Signed)
Progression noted - will be due for rpt DEXA 09/2023

## 2022-09-26 NOTE — Assessment & Plan Note (Signed)
Update levels off statin. The 10-year ASCVD risk score (Arnett DK, et al., 2019) is: 6.7%   Values used to calculate the score:     Age: 67 years     Sex: Female     Is Non-Hispanic African American: No     Diabetic: No     Tobacco smoker: No     Systolic Blood Pressure: 0000000 mmHg     Is BP treated: Yes     HDL Cholesterol: 75 mg/dL     Total Cholesterol: 198 mg/dL

## 2022-09-26 NOTE — Assessment & Plan Note (Signed)
Update anemia panel with iron.

## 2022-09-26 NOTE — Assessment & Plan Note (Addendum)
[place] CSRS reviewed.  Continues MSIR '15mg'$  TID PRN and lyrica '150mg'$  BID.

## 2022-09-26 NOTE — Assessment & Plan Note (Signed)
H/o this on ursodiol '300mg'$  BID through GI [facility])

## 2022-09-26 NOTE — Assessment & Plan Note (Signed)
Continues adderall XR mg daily

## 2022-09-26 NOTE — Assessment & Plan Note (Signed)
Lost daughter 05/2022 - homicide.  Discussed available resources (Grandparents raising Grandchildren), info provided.

## 2022-09-26 NOTE — Assessment & Plan Note (Signed)
Previously discussed.

## 2022-09-26 NOTE — Assessment & Plan Note (Addendum)
Update levels. Encouraged limiting added sugar in diet. Discussed nightly ice cream.

## 2022-09-26 NOTE — Assessment & Plan Note (Signed)
On pantoprazole '40mg'$  BID through GI.

## 2022-10-06 ENCOUNTER — Other Ambulatory Visit: Payer: Self-pay | Admitting: Rheumatology

## 2022-10-11 ENCOUNTER — Other Ambulatory Visit: Payer: Self-pay | Admitting: Obstetrics and Gynecology

## 2022-10-11 DIAGNOSIS — Z1231 Encounter for screening mammogram for malignant neoplasm of breast: Secondary | ICD-10-CM

## 2022-10-18 ENCOUNTER — Telehealth: Payer: Self-pay | Admitting: Family Medicine

## 2022-10-18 NOTE — Telephone Encounter (Signed)
Spoke with pt scheduling OV tomorrow at 7:20 with Anda Kraft.

## 2022-10-18 NOTE — Telephone Encounter (Signed)
Patient called in and stated that her grandson was diagnosed with an URI. She stated that she believes she has it now, she is experiencing cough with yellow/green mucus and a little congestion. She stated that her grand kids go on Spring break tomorrow and she was wanting to know if Dr. Darnell Level could send something in for her without her coming in. Please advise. Thank you!

## 2022-10-19 ENCOUNTER — Ambulatory Visit (INDEPENDENT_AMBULATORY_CARE_PROVIDER_SITE_OTHER): Payer: Medicare Other | Admitting: Primary Care

## 2022-10-19 ENCOUNTER — Encounter: Payer: Self-pay | Admitting: Primary Care

## 2022-10-19 VITALS — BP 134/78 | HR 88 | Temp 98.8°F | Ht 62.5 in | Wt 116.0 lb

## 2022-10-19 DIAGNOSIS — R051 Acute cough: Secondary | ICD-10-CM | POA: Diagnosis not present

## 2022-10-19 MED ORDER — AZITHROMYCIN 250 MG PO TABS: ORAL_TABLET | ORAL | 0 refills | Status: DC

## 2022-10-19 NOTE — Progress Notes (Signed)
Subjective:    Patient ID: Rachel Duran, female    DOB: Aug 10, 1955, 67 y.o.   MRN: [id number]  Cough Pertinent negatives include no fever, headaches or shortness of breath.    Rachel Duran is a very pleasant 67 y.o. female patient of Dr. Danise Mina with a history of pulmonary nodule, systemic sclerosis, dysphagia, GERD, prediabetes who presents today to discuss cough.  Symptom onset 6 days ago with a scratchy throat. She then developed cough, chest congestion, voice hoariness, body aches. Cough is worse at night. Today she's feeling worse.   She denies fevers, shortness of breath. She's not taken anything OTC for her symptoms. She was exposed to her grandson who had the same symptoms. She tested for Covid-19 infection last night which was negative.    Review of Systems  Constitutional:  Positive for fatigue. Negative for fever.  HENT:  Positive for congestion and voice change.   Respiratory:  Positive for cough. Negative for shortness of breath.   Neurological:  Negative for headaches.         Past Medical History:  Diagnosis Date   Abdominal pain last 4 months   and nausea also   Allergy    Anemia    history of   Anxiety    Bipolar disorder (HCC)    atpical bipolar disorder   Cervical disc disease limited rom turning to left   hx. C6- C7 -hx. past fusion(bone graft used)   Cholecystitis    Chronic pain    DDD (degenerative disc disease), lumbar 09/2015   dextroscoliosis with multilevel DDD and facet arthrosis most notable for R foraminal disc protrusion L4/5 producing severe R neural foraminal stenosis abutting R L4 nerve root, moderate spinal canal and mild lat recesss and R neural foraminal stenosis L3/4 (MRI)   Depression    bipolar depression   Disorders of porphyrin metabolism    Felon of finger of left hand 11/10/2016   Fibromyalgia    GERD (gastroesophageal reflux disease)    Headache    occasionally   Internal hemorrhoids    Interstitial  cystitis 06/06/2012   hx.   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   Scleroderma ([place]) 04/24/2020   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 (Dr Baird Cancer retinologist)   Tuberculosis     Social History   Socioeconomic History   Marital status: Married    Spouse name: Not on file   Number of children: Not on file   Years of education: Not on file   Highest education level: Not on file  Occupational History   Occupation: retired    Fish farm manager: UNEMPLOYED  Tobacco Use   Smoking status: Never    Passive exposure: Never   Smokeless tobacco: Never  Vaping Use   Vaping Use: Never used  Substance and Sexual Activity   Alcohol use: No    Alcohol/week: 0.0 standard drinks of alcohol   Drug use: No   Sexual activity: Yes    Partners: Male    Birth control/protection: Post-menopausal    Comment: vasectomy,  1st intercourse- 45 y.o., partner- 1, DES exposure unknown  Other Topics Concern   Not on file  Social History Narrative   Lives with husband and dog   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   Activity: tries to walk dog (45 min 3x/wk), yoga   Diet: good water, fruits/vegetables daily      Rheum: [name]   Psychiatrist: [facility]   Surgery: Redmond Pulling   [facility]   Urology: Amalia Hailey   Social Determinants of Health   Financial Resource Strain: Low Risk  (09/22/2022)   Overall Financial Resource Strain (CARDIA)    Difficulty of Paying Living Expenses: Not hard at all  Food Insecurity: No Food Insecurity (09/22/2022)   Hunger Vital Sign    Worried About Running Out of Food in the Last Year:  Never true    [place] in the Last Year: Never true  Transportation Needs: No Transportation Needs (09/22/2022)   PRAPARE - Hydrologist (Medical): No    Lack of Transportation (Non-Medical): No  Physical Activity: Inactive (09/22/2022)   Exercise Vital Sign    Days of Exercise per Week: 0 days    Minutes of Exercise per Session: 0 min  Stress: No Stress Concern Present (09/22/2022)   [place]    Feeling of Stress : Not at all  Social Connections: Moderately Integrated (09/22/2022)   Social Connection and Isolation Panel [NHANES]    Frequency of Communication with Friends and Family: More than three times a week    Frequency of Social Gatherings with Friends and Family: More than three times a week    Attends Religious Services: More than 4 times per year    Active Member of [employer] or Organizations: No    Attends Archivist Meetings: Never    Marital Status: Married  Human resources officer Violence: Not At Risk (09/22/2022)   Humiliation, Afraid, Rape, and Kick questionnaire    Fear of Current or Ex-Partner: No    Emotionally Abused: No    Physically Abused: No    Sexually Abused: No    Past Surgical History:  Procedure Laterality Date   ANTERIOR CERVICAL DECOMP/DISCECTOMY FUSION  2004   C5/6, C6/7   ANTERIOR CERVICAL DECOMP/DISCECTOMY FUSION  02/2016   C3/4, C4/5 with plating [name])   ANTERIOR CERVICAL DECOMP/DISCECTOMY FUSION  12/29/2021   Procedure: ANTERIOR CERVICAL DECOMPRESSION/ DISCECTOMY FUSION CERVICAL THREE - SEVEN;  Surgeon: Newman Pies, MD;  Location: [place];  Service: Neurosurgery;;   ANTERIOR CERVICAL DECOMPRESSION/DISCECTOMY FUSION 4 LEVELS N/A 12/29/2021   Procedure: CERVICAL FIVE-SIX CORPECTOMY,;  Surgeon: Newman Pies, MD;  Location: [place];  Service: Neurosurgery;  Laterality: N/A;   AUGMENTATION MAMMAPLASTY Bilateral 03/25/2010   BREAST ENHANCEMENT  SURGERY  2010   BREAST IMPLANT EXCHANGE  10/2014   exchange saline implants, B mastopexy/capsulorraphy ([facility])   BUNIONECTOMY Bilateral yrs ago   [place]   x 1   CHOLECYSTECTOMY  06/11/2012   Procedure: LAPAROSCOPIC CHOLECYSTECTOMY WITH INTRAOPERATIVE CHOLANGIOGRAM;  Surgeon: [name], MD,FACS;  Location: [facility];  Service: General;  Laterality: N/A;   COLONOSCOPY  02/2018   done for positive cologuard - WNL, rpt 10 yrs [facility] Plan)   CYSTOSCOPY     ERCP  05/22/2012   Procedure: ENDOSCOPIC RETROGRADE CHOLANGIOPANCREATOGRAPHY (ERCP);  Surgeon: [name], MD,FACG;  Location: [facility];  Service: Endoscopy;  Laterality: N/A;   ERCP N/A 09/17/2013  Procedure: ENDOSCOPIC RETROGRADE CHOLANGIOPANCREATOGRAPHY (ERCP);  Surgeon: [name], MD;  Location: [facility];  Service: Endoscopy;  Laterality: N/A;   ERCP N/A 09/27/2018   Procedure: ENDOSCOPIC RETROGRADE CHOLANGIOPANCREATOGRAPHY (ERCP);  Surgeon: [name], MD;  Location: [facility];  Service: Endoscopy;  Laterality: N/A;   ESOPHAGOGASTRODUODENOSCOPY  09/2016   WNL. esophagus dilated Fuller Plan)   HEMORRHOID BANDING  09/23/2013   --Dr. Greer Pickerel   HERNIA REPAIR     inguinal   HYSTEROSCOPY W/ ENDOMETRIAL ABLATION     KNEE ARTHROSCOPY Right 04/06/2021   Procedure: RIGHT KNEE ARTHROSCOPY WITH DEBRIDEMENT;  Surgeon: [name], MD;  Location: [place];  Service: Orthopedics;  Laterality: Right;   NASAL SINUS SURGERY     x5   PARTIAL KNEE ARTHROPLASTY Right 06/01/2020   Procedure: Right knee patellofemoral replacement;  Surgeon: [name], MD;  Location: [place];  Service: Orthopedics;  Laterality: Right;   POSTERIOR CERVICAL FUSION/FORAMINOTOMY N/A 12/29/2021   Procedure: POSTERIOR CERVICAL FUSION CERVICAL THREE-SEVEN;  Surgeon: Newman Pies, MD;  Location: [place];  Service: Neurosurgery;  Laterality: N/A;   REMOVAL OF STONES  09/27/2018    Procedure: REMOVAL OF STONES;  Surgeon: [name], MD;  [facility];  Service: Endoscopy;;   SPHINCTEROTOMY  09/27/2018   Procedure: SPHINCTEROTOMY;  Surgeon: [name], MD;  [facility];  Service: Endoscopy;;   TONSILLECTOMY     removed as a child   TOTAL HIP ARTHROPLASTY Left 03/12/2019   Procedure: LEFT TOTAL HIP ARTHROPLASTY ANTERIOR APPROACH;  [name], [name], MD)   UPPER GASTROINTESTINAL ENDOSCOPY      Family History  Problem Relation Age of Onset   CAD Father 9       MI, nonsmoker   Esophageal cancer Father 77   Stomach cancer Father    Scleroderma Mother    Hypertension Mother    Esophageal cancer Paternal Grandfather    Stomach cancer Paternal Grandfather    Diabetes Maternal Grandmother    Arthritis Brother    Stroke Neg Hx    Colon cancer Neg Hx    Rectal cancer Neg Hx     Allergies  Allergen Reactions   Celebrex [Celecoxib] Rash    Headaches   Inh [Isoniazid] Other (See Comments)    Hepatitis   Cymbalta [Duloxetine Hcl] Other (See Comments)    Urinary retention   Nucynta [Tapentadol] Other (See Comments)    Agitation Jerking in legs   Silenor [Doxepin Hcl] Other (See Comments)    Nightmare Dizziness  Stumbling upon walking Not effective   Benzoin Dermatitis   Duragesic-100 [Fentanyl] Other (See Comments)    Cold intolerance Arms twitching Insomnia  Fatigue    Keflex [Cephalexin] Nausea And Vomiting   Codeine Phosphate Nausea And Vomiting   Demerol [Meperidine Hcl] Rash    Red skin   Lithobid [Lithium] Other (See Comments)    Sores in mouth    Sulfamethoxazole Rash    Current Outpatient Medications on File Prior to Visit  Medication Sig Dispense Refill   ALPRAZolam (XANAX) 1 MG tablet Take 0.5 tablets (0.5 mg total) by mouth at bedtime. 30 tablet 0   amLODipine (NORVASC) 2.5 MG tablet TAKE 1 TABLET BY MOUTH DAILY 90 tablet 0   amLODipine (NORVASC) 5 MG tablet TAKE 1 TABLET BY MOUTH DAILY 90 tablet  0   amphetamine-dextroamphetamine (ADDERALL XR) 30 MG 24 hr capsule TAKE 1 CAPSUEL BY MOUTH EACH MORNING 90 capsule 0  ARIPiprazole (ABILIFY) 5 MG tablet Take 5 mg by mouth at bedtime.     Calcium Carbonate-Vitamin D (CALTRATE 600+D PO) Take 2 tablets by mouth daily.     clindamycin (CLEOCIN) 150 MG capsule Take 600mg  1 hour prior to dental procedure 10 capsule 0   cyanocobalamin (VITAMIN B12) 1000 MCG/ML injection INJECT 1ML INTO THE MUSCLE EVERY 21 DAYSAS DIRECTED 3 mL 3   Eszopiclone 3 MG TABS Take 1 tablet (3 mg total) by mouth at bedtime. Take immediately before bedtime 90 tablet 0   ferrous sulfate 325 (65 FE) MG tablet Take 1 tablet (325 mg total) by mouth every Monday, Wednesday, and Friday. 30 tablet 3   lamoTRIgine (LAMICTAL) 200 MG tablet Take 1 tablet (200 mg total) by mouth daily. 30 tablet 0   lidocaine (LIDODERM) 5 % Place 3 patches onto the skin daily. Remove & Discard patch within 12 hours or as directed by MD 60 patch 0   lidocaine (XYLOCAINE) 5 % ointment Apply 1 Application topically as needed. Apply up to 4x/day- on LUE for nerve pain 50 g 5   linaclotide (LINZESS) 290 MCG CAPS capsule Take 1 capsule (290 mcg total) by mouth daily. 90 capsule 2   morphine (MSIR) 15 MG tablet TAKE ONE TABLET BY MOUTH EVERY 6 HOURS AS NEEDED FOR FOR SEVERE PAIN 90 tablet 0   naloxegol oxalate (MOVANTIK) 25 MG TABS tablet TAKE ONE TABLET BY MOUTH EVERY DAY 90 tablet 0   NONFORMULARY OR COMPOUNDED ITEM Testosterone proprionate petrolatum (jar) 2% ointment  2 Grams S:  apply a small amount once daily x 5 days per week. 2 each 0   ondansetron (ZOFRAN) 4 MG tablet TAKE 1 TABLET BY MOUTH EVERY 8 HOURS AS NEEDED FOR NAUSEA AND VOMITING. 30 tablet 0   ondansetron (ZOFRAN-ODT) 4 MG disintegrating tablet Take 4 mg by mouth every 6 (six) hours as needed for nausea or vomiting.     pantoprazole (PROTONIX) 40 MG tablet TAKE ONE TABLET TWICE A DAY WITH MEALS 180 tablet 3   polyethylene glycol (MIRALAX /  GLYCOLAX) 17 g packet Take 17 g by mouth 2 (two) times daily.     Prasterone (INTRAROSA) 6.5 MG INST UNWRAP AND INSERT 1 SUPPOSITORY  VAGINALLY WITH PROVIDED  APPLICATOR AT BEDTIME 90 each 0   pregabalin (LYRICA) 150 MG capsule Take 150 mg by mouth 2 (two) times daily.     ursodiol (ACTIGALL) 300 MG capsule TAKE 1 CAPSULE BY MOUTH TWICE DAILY 180 capsule 3   No current facility-administered medications on file prior to visit.    BP 134/78   Pulse 88   Temp 98.8 F (37.1 C) (Temporal)   Ht 5' 2.5" (1.588 m)   Wt 116 lb (52.6 kg)   LMP 07/25/1998   SpO2 98%   BMI 20.88 kg/m  Objective:   Physical Exam Constitutional:      Appearance: She is ill-appearing.  HENT:     Right Ear: Tympanic membrane and ear canal normal.     Left Ear: Tympanic membrane and ear canal normal.     Nose:     Right Sinus: No maxillary sinus tenderness or frontal sinus tenderness.     Left Sinus: No maxillary sinus tenderness or frontal sinus tenderness.     Mouth/Throat:     Pharynx: No posterior oropharyngeal erythema.  Eyes:     Conjunctiva/sclera: Conjunctivae normal.  Cardiovascular:     Rate and Rhythm: Normal rate and regular rhythm.  Pulmonary:  Effort: Pulmonary effort is normal.     Breath sounds: Normal breath sounds. No wheezing or rales.  Musculoskeletal:     Cervical back: Neck supple.  Lymphadenopathy:     Cervical: No cervical adenopathy.  Skin:    General: Skin is warm and dry.           Assessment & Plan:  Acute cough Assessment & Plan: Could very well be viral etiology, discussed with patient. Given medical history, coupled with presentation and worsening of symptoms, will treat.  Start Azithromycin antibiotics for infection. Take 2 tablets by mouth today, then 1 tablet daily for 4 additional days. She declines Best boy.  Follow up with PCP if no improvement.   Orders: -     Azithromycin; Take 2 tablets by mouth today, then 1 tablet daily for 4 additional  days.  Dispense: 6 tablet; Refill: 0        Pleas Koch, NP

## 2022-10-19 NOTE — Assessment & Plan Note (Signed)
Could very well be viral etiology, discussed with patient. Given medical history, coupled with presentation and worsening of symptoms, will treat.  Start Azithromycin antibiotics for infection. Take 2 tablets by mouth today, then 1 tablet daily for 4 additional days. She declines Best boy.  Follow up with PCP if no improvement.

## 2022-10-19 NOTE — Patient Instructions (Signed)
Start Azithromycin antibiotics for infection. Take 2 tablets by mouth today, then 1 tablet daily for 4 additional days.  It was a pleasure meeting you!

## 2022-10-31 ENCOUNTER — Other Ambulatory Visit: Payer: Self-pay | Admitting: Family Medicine

## 2022-10-31 ENCOUNTER — Other Ambulatory Visit: Payer: Self-pay | Admitting: Physician Assistant

## 2022-11-01 NOTE — Telephone Encounter (Signed)
Last Fill: 08/01/2022  Next Visit: 02/06/2023  Last Visit: 09/06/2022  Dx: [name] disease without gangrene   Current Dose per office note on 09/06/2022: Norvasc 7.5 mg QD   Okay to refill Amlodipine?

## 2022-11-01 NOTE — Telephone Encounter (Signed)
Refill request [facility] Last refill 06/07/22 #90 Last office visit 10/19/22 acute Upcoming appointment 12/27/22

## 2022-11-07 ENCOUNTER — Ambulatory Visit
Admission: RE | Admit: 2022-11-07 | Discharge: 2022-11-07 | Disposition: A | Discharge status: Home / Self Care | Payer: Medicare Other | Source: Ambulatory Visit | Attending: Pulmonary Disease | Admitting: Pulmonary Disease

## 2022-11-07 DIAGNOSIS — M349 Systemic sclerosis, unspecified: Secondary | ICD-10-CM

## 2022-11-07 DIAGNOSIS — R911 Solitary pulmonary nodule: Secondary | ICD-10-CM

## 2022-11-21 ENCOUNTER — Other Ambulatory Visit: Payer: Self-pay | Admitting: Family Medicine

## 2022-11-21 DIAGNOSIS — G894 Chronic pain syndrome: Secondary | ICD-10-CM

## 2022-11-21 NOTE — Telephone Encounter (Signed)
Name of Medication: MSIR Name of [facility]acy: [facility] Last Fill or Written Date and Quantity: 10/11/22, #90 Last Office Visit and Type: 09/26/22, CPE Next Office Visit and Type: 12/27/22, 3 mo pain mgmt f/u Last Controlled Substance Agreement Date: 04/19/19 Last UDS: 04/19/19

## 2022-11-22 ENCOUNTER — Encounter: Payer: Self-pay | Admitting: Family Medicine

## 2022-11-22 NOTE — Telephone Encounter (Signed)
ERx 

## 2022-12-09 ENCOUNTER — Other Ambulatory Visit: Payer: Self-pay | Admitting: Family Medicine

## 2022-12-09 NOTE — Telephone Encounter (Signed)
Zofran Last filled:  09/26/22, #30 Last OV:  09/26/22, CPE Next OV:  12/27/22, 3 mo f/u

## 2022-12-12 ENCOUNTER — Other Ambulatory Visit: Payer: Self-pay | Admitting: Rheumatology

## 2022-12-12 NOTE — Telephone Encounter (Signed)
Last Fill: 09/13/2022  Next Visit: 02/06/2023   Last Visit:  09/06/2022   Dx: [name] disease without gangrene   Current Dose per office note on 09/12/2022:  Norvasc 7.5 mg QD   Okay to refill Amlodipine?

## 2022-12-13 ENCOUNTER — Other Ambulatory Visit: Payer: Self-pay | Admitting: Obstetrics and Gynecology

## 2022-12-13 ENCOUNTER — Ambulatory Visit
Admission: RE | Admit: 2022-12-13 | Discharge: 2022-12-13 | Disposition: A | Discharge status: Home / Self Care | Payer: Medicare Other | Source: Ambulatory Visit | Attending: Obstetrics and Gynecology | Admitting: Obstetrics and Gynecology

## 2022-12-13 DIAGNOSIS — Z1231 Encounter for screening mammogram for malignant neoplasm of breast: Secondary | ICD-10-CM

## 2022-12-20 ENCOUNTER — Other Ambulatory Visit: Payer: Self-pay | Admitting: Family Medicine

## 2022-12-20 DIAGNOSIS — G9332 Myalgic encephalomyelitis/chronic fatigue syndrome: Secondary | ICD-10-CM

## 2022-12-20 NOTE — Telephone Encounter (Signed)
Name of Medication: Adderall XR Name of Pharmacy: [facility] Last Fill or Written Date and Quantity: 09/26/22, #90 Last Office Visit and Type:  09/26/22, CPE Next Office Visit and Type:  12/27/22, 3 mo f/u Last Controlled Substance Agreement Date: 04/19/19 Last UDS: 04/19/19

## 2022-12-21 ENCOUNTER — Encounter: Payer: Self-pay | Admitting: Family Medicine

## 2022-12-21 NOTE — Telephone Encounter (Signed)
ERx 

## 2022-12-26 ENCOUNTER — Other Ambulatory Visit: Payer: Self-pay | Admitting: Family Medicine

## 2022-12-26 DIAGNOSIS — G9332 Myalgic encephalomyelitis/chronic fatigue syndrome: Secondary | ICD-10-CM

## 2022-12-26 NOTE — Telephone Encounter (Signed)
Looks like you called in 5 days ago. Will not let me refuse refill.

## 2022-12-27 ENCOUNTER — Encounter: Payer: Self-pay | Admitting: Family Medicine

## 2022-12-27 ENCOUNTER — Ambulatory Visit (INDEPENDENT_AMBULATORY_CARE_PROVIDER_SITE_OTHER): Payer: Medicare Other | Admitting: Family Medicine

## 2022-12-27 VITALS — BP 130/82 | HR 104 | Temp 97.6°F | Ht 62.5 in | Wt 112.1 lb

## 2022-12-27 DIAGNOSIS — F331 Major depressive disorder, recurrent, moderate: Secondary | ICD-10-CM | POA: Diagnosis not present

## 2022-12-27 DIAGNOSIS — M797 Fibromyalgia: Secondary | ICD-10-CM

## 2022-12-27 DIAGNOSIS — G4700 Insomnia, unspecified: Secondary | ICD-10-CM | POA: Diagnosis not present

## 2022-12-27 DIAGNOSIS — G8929 Other chronic pain: Secondary | ICD-10-CM | POA: Diagnosis not present

## 2022-12-27 DIAGNOSIS — G9332 Myalgic encephalomyelitis/chronic fatigue syndrome: Secondary | ICD-10-CM

## 2022-12-27 DIAGNOSIS — M349 Systemic sclerosis, unspecified: Secondary | ICD-10-CM

## 2022-12-27 DIAGNOSIS — I7300 Raynaud's syndrome without gangrene: Secondary | ICD-10-CM

## 2022-12-27 MED ORDER — LIDOCAINE 5 % EX OINT: 1.0000 | TOPICAL_OINTMENT | CUTANEOUS | 6 refills | Status: AC | PRN

## 2022-12-27 NOTE — Progress Notes (Signed)
Ph: [phone] Fax: [phone]   Patient ID: Rachel Duran, female    DOB: [birth year], 67 y.o.   MRN: [id number]  This visit was conducted in person.  BP 130/82   Pulse (!) 104   Temp 97.6 F (36.4 C) (Temporal)   Ht 5' 2.5" (1.588 m)   Wt 112 lb 2 oz (50.9 kg)   LMP 07/25/1998   SpO2 96%   BMI 20.18 kg/m    CC: 3 mo chronic pain f/u visit  Subjective:   HPI: Rachel Duran is a 67 y.o. female presenting on 12/27/2022 for Medical Management of Chronic Issues (Here for 3 mo f/u. )   Continues caring for 3 grandchildren, recent trip to [place] to visit other daughter and her family.   Chronic pain on MSIR 15mg  Q6 hours PRN pain, normally takes BID. Also on lyrica 150mg  BID, lidocaine patches. H/o multiple spine surgeries with residual pain.   Chronic fatigue on adderall XR 30mg  daily.   Known limited systemic sclerosis, OA, raynaud's and FM followed by rheumatology [facility]) - uses aleve, lidocaine patches, amlodipine 7.5mg  daily.   Recent displaced lateral L clavicle fracture managed by ortho August Saucer) with sling/conservative management.  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  Had complex neck surgery Lovell Sheehan) for C5/6 fracture subluxation after fall in h/o prior cervical fusion.  Has seen [facility] for persistent cervical radiculopathy.   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  In evenings coughing up thick mucous with L upper chest congestion when she eats something cold.   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 Interim medical history since our last visit reviewed. Allergies and medications reviewed and updated. Outpatient Medications Prior to Visit   Medication Sig Dispense Refill   ALPRAZolam (XANAX) 1 MG tablet Take 0.5 tablets (0.5 mg total) by mouth at bedtime. 30 tablet 0   amLODipine (NORVASC) 2.5 MG tablet TAKE 1 TABLET BY MOUTH DAILY 90 tablet 0   amLODipine (NORVASC) 5 MG tablet TAKE 1 TABLET BY MOUTH DAILY 90 tablet 0   amphetamine-dextroamphetamine (ADDERALL XR) 30 MG 24 hr capsule TAKE 1 CAPSULE BY MOUTH EVERY MORNING 90 capsule 0   ARIPiprazole (ABILIFY) 5 MG tablet Take 5 mg by mouth at bedtime.     Calcium Carbonate-Vitamin D (CALTRATE 600+D PO) Take 2 tablets by mouth daily.     clindamycin (CLEOCIN) 150 MG capsule Take 600mg  1 hour prior to dental procedure 10 capsule 0   cyanocobalamin (VITAMIN B12) 1000 MCG/ML injection INJECT INTO THE MUSCLE EVERY 21 DAYSAS DIRECTED 3 mL 3   ferrous sulfate 325 (65 FE) MG tablet Take 1 tablet (325 mg total) by mouth every Monday, Wednesday, and Friday. 30 tablet 3   lamoTRIgine (LAMICTAL) 200 MG tablet Take 1 tablet (200 mg total) by mouth daily. 30 tablet 0   lidocaine (LIDODERM) 5 % Place 3 patches onto the skin daily. Remove & Discard patch within 12 hours or as directed by MD 60 patch 0   linaclotide (LINZESS) 290 MCG CAPS capsule Take 1 capsule (290 mcg total) by mouth daily. 90 capsule 2   morphine (MSIR) 15 MG tablet TAKE ONE TABLET BY MOUTH EVERY 6 HOURS  AS NEEDED FOR SEVERE PAIN 90 tablet 0   MOVANTIK 25 MG TABS tablet TAKE ONE TABLET BY MOUTH EVERY DAY 90 tablet 0   NONFORMULARY OR COMPOUNDED ITEM Testosterone proprionate petrolatum (jar) 2% ointment  2 Grams S:  apply a small amount once daily x 5 days per week. 2 each 0   ondansetron (ZOFRAN) 4 MG tablet TAKE 1 TABLET BY MOUTH EVERY 8 HOURS AS NEEDED FOR NAUSEA AND VOMITING 30 tablet 0   pantoprazole (PROTONIX) 40 MG tablet TAKE ONE TABLET TWICE A DAY WITH MEALS 180 tablet 3   polyethylene glycol (MIRALAX / GLYCOLAX) 17 g packet Take 17 g by mouth 2 (two) times daily.     Prasterone (INTRAROSA) 6.5 MG INST UNWRAP AND  INSERT 1 SUPPOSITORY  VAGINALLY WITH PROVIDED  APPLICATOR AT BEDTIME 90 each 0   pregabalin (LYRICA) 150 MG capsule Take 150 mg by mouth 2 (two) times daily.     ursodiol (ACTIGALL) 300 MG capsule TAKE 1 CAPSULE BY MOUTH TWICE DAILY 180 capsule 3   Eszopiclone 3 MG TABS Take 1 tablet (3 mg total) by mouth at bedtime. Take immediately before bedtime 90 tablet 0   lidocaine (XYLOCAINE) 5 % ointment Apply 1 Application topically as needed. Apply up to 4x/day- on LUE for nerve pain 50 g 5   azithromycin (ZITHROMAX) 250 MG tablet Take 2 tablets by mouth today, then 1 tablet daily for 4 additional days. 6 tablet 0   No facility-administered medications prior to visit.     Per HPI unless specifically indicated in ROS section below Review of Systems  Objective:  BP 130/82   Pulse (!) 104   Temp 97.6 F (36.4 C) (Temporal)   Ht 5' 2.5" (1.588 m)   Wt 112 lb 2 oz (50.9 kg)   LMP 07/25/1998   SpO2 96%   BMI 20.18 kg/m   Wt Readings from Last 3 Encounters:  12/27/22 112 lb 2 oz (50.9 kg)  10/19/22 116 lb (52.6 kg)  09/26/22 116 lb 8 oz (52.8 kg)      Physical Exam Vitals and nursing note reviewed.  Constitutional:      Appearance: Normal appearance. She is not ill-appearing.  HENT:     Mouth/Throat:     Mouth: Mucous membranes are moist.     Pharynx: Oropharynx is clear. No oropharyngeal exudate or posterior oropharyngeal erythema.  Eyes:     Extraocular Movements: Extraocular movements intact.     Pupils: Pupils are equal, round, and reactive to light.  Cardiovascular:     Rate and Rhythm: Normal rate and regular rhythm.     Pulses: Normal pulses.     Heart sounds: Normal heart sounds. No murmur heard. Pulmonary:     Effort: Pulmonary effort is normal. No respiratory distress.     Breath sounds: Normal breath sounds. No wheezing, rhonchi or rales.  Musculoskeletal:     Cervical back: Normal range of motion and neck supple.     Right lower leg: No edema.     Left lower leg: No  edema.  Lymphadenopathy:     Cervical: No cervical adenopathy.  Skin:    General: Skin is warm and dry.  Neurological:     Mental Status: She is alert.  Psychiatric:        Mood and Affect: Mood normal.        Behavior: Behavior normal.       Results for orders placed or performed in visit on 09/26/22  Lipid  panel  Result Value Ref Range   Cholesterol 177 0 - 200 mg/dL   Triglycerides 16.1 0.0 - 149.0 mg/dL   HDL 09.60 >45.40 mg/dL   VLDL 98.1 0.0 - 19.1 mg/dL   LDL Cholesterol 82 0 - 99 mg/dL   Total CHOL/HDL Ratio 2    NonHDL 101.32   Comprehensive metabolic panel  Result Value Ref Range   Sodium 134 (L) 135 - 145 mEq/L   Potassium 4.8 3.5 - 5.1 mEq/L   Chloride 98 96 - 112 mEq/L   CO2 29 19 - 32 mEq/L   Glucose, Bld 95 70 - 99 mg/dL   BUN 7 6 - 23 mg/dL   Creatinine, Ser 4.78 0.40 - 1.20 mg/dL   Total Bilirubin 0.4 0.2 - 1.2 mg/dL   Alkaline Phosphatase 108 39 - 117 U/L   AST 17 0 - 37 U/L   ALT 9 0 - 35 U/L   Total Protein 6.4 6.0 - 8.3 g/dL   Albumin 4.1 3.5 - 5.2 g/dL   GFR 29.56 >21.30 mL/min   Calcium 9.2 8.4 - 10.5 mg/dL  TSH  Result Value Ref Range   TSH 1.52 0.35 - 5.50 uIU/mL  T4, free  Result Value Ref Range   Free T4 0.84 0.60 - 1.60 ng/dL  CBC with Differential/Platelet  Result Value Ref Range   WBC 8.5 4.0 - 10.5 K/uL   RBC 4.40 3.87 - 5.11 Mil/uL   Hemoglobin 12.5 12.0 - 15.0 g/dL   HCT 86.5 78.4 - 69.6 %   MCV 84.4 78.0 - 100.0 fl   MCHC 33.6 30.0 - 36.0 g/dL   RDW 29.5 28.4 - 13.2 %   Platelets 320.0 150.0 - 400.0 K/uL   Neutrophils Relative % 74.0 43.0 - 77.0 %   Lymphocytes Relative 18.3 12.0 - 46.0 %   Monocytes Relative 5.8 3.0 - 12.0 %   Eosinophils Relative 1.2 0.0 - 5.0 %   Basophils Relative 0.7 0.0 - 3.0 %   Neutro Abs 6.3 1.4 - 7.7 K/uL   Lymphs Abs 1.6 0.7 - 4.0 K/uL   Monocytes Absolute 0.5 0.1 - 1.0 K/uL   Eosinophils Absolute 0.1 0.0 - 0.7 K/uL   Basophils Absolute 0.1 0.0 - 0.1 K/uL  IBC panel  Result Value Ref Range    Iron 56 42 - 145 ug/dL   Transferrin 440.1 027.2 - 360.0 mg/dL   Saturation Ratios 53.6 (L) 20.0 - 50.0 %   TIBC 404.6 250.0 - 450.0 mcg/dL  Ferritin  Result Value Ref Range   Ferritin 40.7 10.0 - 291.0 ng/mL  VITAMIN D 25 Hydroxy (Vit-D Deficiency, Fractures)  Result Value Ref Range   VITD 30.22 30.00 - 100.00 ng/mL  Hemoglobin A1c  Result Value Ref Range   Hgb A1c MFr Bld 6.0 4.6 - 6.5 %   *Note: Due to a large number of results and/or encounters for the requested time period, some results have not been displayed. A complete set of results can be found in Results Review.    Assessment & Plan:   Problem List Items Addressed This Visit     Encounter for chronic pain management - Primary (Chronic)    [place] CSRS reviewed  Here for chronic pain f/u visit.  Stable period on current regimen - continue MSIR 15mg  TID PRN with lyrica 150mg  BID.       Insomnia    Continue eszopiclone (Lunesta) nightly.       MDD (major depressive disorder), recurrent episode, moderate (  HCC)    Followed by psych - stable period on current regimen.      [name] syndrome    Continues amlodipine 7.5mg  daily through rheum      Fibromyalgia    Continues lyrica 150mg  BID through rheum.       CFS (chronic fatigue syndrome)    Continue adderall XR daily.       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HCC)    Followed by pulm, cards, rheumatology.         Meds ordered this encounter  Medications   lidocaine (XYLOCAINE) 5 % ointment    Sig: Apply 1 Application topically as needed. Apply up to 4x/day- on LUE for nerve pain    Dispense:  50 g    Refill:  6   Eszopiclone 3 MG TABS    Sig: Take 1 tablet (3 mg total) by mouth at bedtime. Take immediately before bedtime    Dispense:  90 tablet    Refill:  0    No orders of the defined types were placed in this encounter.   Patient Instructions  Good to see you today Continue current medicines.  Return in 3 months for chronic pain follow up visit.    Follow up plan: Return in about 3 months (around 03/29/2023) for follow up visit.  Eustaquio Boyden, MD

## 2022-12-27 NOTE — Patient Instructions (Addendum)
Good to see you today Continue current medicines.  Return in 3 months for chronic pain follow up visit.

## 2023-01-03 MED ORDER — ESZOPICLONE 3 MG PO TABS: 3.0000 mg | ORAL_TABLET | Freq: Every day | ORAL | 0 refills | Status: DC

## 2023-01-03 NOTE — Assessment & Plan Note (Signed)
Followed by psych - stable period on current regimen.

## 2023-01-03 NOTE — Assessment & Plan Note (Signed)
Continue adderall XR daily.

## 2023-01-03 NOTE — Assessment & Plan Note (Signed)
Continue eszopiclone (Lunesta) nightly.

## 2023-01-03 NOTE — Assessment & Plan Note (Signed)
Continues amlodipine 7.5mg  daily through rheum

## 2023-01-03 NOTE — Assessment & Plan Note (Addendum)
[place] CSRS reviewed  Here for chronic pain f/u visit.  Stable period on current regimen - continue MSIR 15mg  TID PRN with lyrica 150mg  BID.

## 2023-01-03 NOTE — Assessment & Plan Note (Signed)
Continues lyrica 150mg  BID through rheum.

## 2023-01-03 NOTE — Assessment & Plan Note (Signed)
Followed by pulm, cards, rheumatology.

## 2023-01-09 ENCOUNTER — Other Ambulatory Visit: Payer: Self-pay | Admitting: Family Medicine

## 2023-01-09 DIAGNOSIS — G894 Chronic pain syndrome: Secondary | ICD-10-CM

## 2023-01-10 NOTE — Telephone Encounter (Signed)
ERx 

## 2023-01-10 NOTE — Telephone Encounter (Addendum)
Name of Medication: MSIR Name of [facility]acy[facility] Last Fill or Written Date and Quantity: 11/22/22, #90 Last Office Visit and Type:  12/27/22, 3 mo pain mgmt f/u  Next Office Visit and Type:  04/07/23, 3 mo pain mgmt f/u Last Controlled Substance Agreement Date: 04/19/19 Last UDS: 04/19/19

## 2023-01-12 ENCOUNTER — Telehealth: Payer: Self-pay

## 2023-01-12 DIAGNOSIS — R6882 Decreased libido: Secondary | ICD-10-CM

## 2023-01-12 NOTE — Telephone Encounter (Signed)
BC pt.  Last seen for 69yr med f/u 04/2022--due 04/2023 for B&P, scheduled 05/08/2023. Last mammo 12/13/2022-neg birads 1  Last testosterone checked 04/29/2021  Rx pend. Please advise.

## 2023-01-12 NOTE — Telephone Encounter (Signed)
Spoke w/ pt and she states she will cb at her earliest convenience to schedule lab appt. States she lives in [place]. Will place order for lab and will leave encounter open to f/u.

## 2023-01-12 NOTE — Telephone Encounter (Signed)
She needs a testosterone panel prior to a refill. Please order [id number] under Dr Edward Jolly. Once that is back her medication can be refilled.

## 2023-01-13 NOTE — Telephone Encounter (Signed)
Lab visit scheduled on 01/18/23. Will route to provider

## 2023-01-18 ENCOUNTER — Other Ambulatory Visit: Payer: Medicare Other

## 2023-01-18 DIAGNOSIS — R6882 Decreased libido: Secondary | ICD-10-CM

## 2023-01-19 ENCOUNTER — Telehealth: Payer: Self-pay

## 2023-01-19 NOTE — Patient Outreach (Signed)
  Care Coordination   Initial Visit Note   01/19/2023 Name: Rachel Duran MRN: [id number] DOB: 04/09/1956  Rachel Duran is a 67 y.o. year old female who sees Rachel Boyden, MD for primary care. I spoke with  Rachel Duran by phone today.  What matters to the patients health and wellness today?  Patient denies having any nursing or community resource needs. Verbalized appreciation of call.     Goals Addressed             This Visit's Progress    Care coordination activities - no follow up needed       Interventions Today    Flowsheet Row Most Recent Value  General Interventions   General Interventions Discussed/Reviewed General Interventions Discussed  [Care coordination services discussed. SDOH survey completed. AWV discussed and patient advised to contact provider office to schedule. Discussed vaccines. Advised to contact PCP office if care coordination services needed in the future.]              SDOH assessments and interventions completed:  Yes  SDOH Interventions Today    Flowsheet Row Most Recent Value  SDOH Interventions   Food Insecurity Interventions Intervention Not Indicated  Housing Interventions Intervention Not Indicated  Transportation Interventions Intervention Not Indicated        Care Coordination Interventions:  Yes, provided   Follow up plan: No further intervention required.   Encounter Outcome:  Pt. Visit Completed   Rachel Ina RN,BSN,CCM [facility] Care Coordination [phone] direct line

## 2023-01-21 LAB — TESTOS,TOTAL,FREE AND SHBG (FEMALE)
Free Testosterone: 0.7 pg/mL (ref 0.1–6.4)
Sex Hormone Binding: 130.2 nmol/L — ABNORMAL HIGH (ref 14–73)
Testosterone, Total, LC-MS-MS: 13 ng/dL (ref 2–45)

## 2023-01-23 NOTE — Progress Notes (Signed)
Office Visit Note  Patient: Rachel Duran             Date of Birth: 1956/05/17           MRN: [id number]             PCP: Eustaquio Boyden, MD Referring: Eustaquio Boyden, MD Visit Date: 02/06/2023 Occupation: @GUAROCC @  Subjective:  Increased joint pain   History of Present Illness: Rachel Duran is a 67 y.o. female with history of limited systemic sclerosis and DDD.  Patient has been under a tremendous amount of stress caring for her 3 grandchildren who live with her permanently since the loss of her daughter.  Patient has noticed increased fatigue and arthralgias which she attributes to being under increased stress and being unable to perform her usual exercise regimen.  The patient remains involved with her church community and started grief counseling last week. Patient states that she has an upcoming appointment with Dr. Lovell Sheehan next week due to ongoing pain and stiffness in her neck.  She has also been more symptomatic from the scoliosis in her spine.  Patient states that she was released by Dr. Berline Chough in December 2023 but remains on Lyrica as prescribed.   Patient had an updated high-resolution chest CT on 11/07/2022 ordered by Dr. Isaiah Serge at which did not reveal any findings of ILD.  She denies any new or worsening pulmonary symptoms.  Patient states that she is scheduled for a follow-up visit with Dr. Gala Romney in December 2024. Patient experiences dysphagia with some solids especially meat which she attributes to her previous discectomy.  She denies any GERD or constipation at this time.   Patient continues to take amlodipine 7.5 mg daily which has been helpful at managing her symptoms of [name] phenomenon.   Activities of Daily Living:  Patient reports morning stiffness for 2-3 hours.   Patient Reports nocturnal pain.  Difficulty dressing/grooming: Denies Difficulty climbing stairs: Reports Difficulty getting out of chair: Reports Difficulty using hands for  taps, buttons, cutlery, and/or writing: Reports  Review of Systems  Constitutional:  Positive for fatigue.  HENT:  Positive for mouth dryness. Negative for mouth sores.   Eyes:  Positive for dryness.  Respiratory:  Negative for shortness of breath.   Cardiovascular:  Negative for chest pain and palpitations.  Gastrointestinal:  Positive for constipation. Negative for blood in stool and diarrhea.  Endocrine: Negative for increased urination.  Genitourinary:  Negative for involuntary urination.  Musculoskeletal:  Positive for joint pain, gait problem, joint pain, myalgias, muscle weakness, morning stiffness, muscle tenderness and myalgias. Negative for joint swelling.  Skin:  Positive for color change and sensitivity to sunlight. Negative for rash and hair loss.  Allergic/Immunologic: Positive for susceptible to infections.  Neurological:  Positive for numbness. Negative for dizziness and headaches.  Hematological:  Negative for swollen glands.  Psychiatric/Behavioral:  Positive for depressed mood and sleep disturbance. The patient is nervous/anxious.     PMFS History:  Patient Active Problem List   Diagnosis Date Noted   Grieving 06/27/2022   Elevated blood pressure reading in office without diagnosis of hypertension 05/31/2022   Closed nondisplaced fracture of lateral end of left clavicle 05/30/2022   Fusion of spine, cervical region    Cervical spine arthritis with nerve pain 12/29/2021   C6 cervical fracture (HCC) 12/29/2021   C5 cervical fracture (HCC) 12/21/2021   Advanced directives, counseling/discussion 09/23/2021   Swelling of right index finger 07/14/2021   Synovitis of right knee  Scoliosis 01/13/2021   Osteopenia 07/02/2020   Status post left knee surgery 06/01/2020   Pulmonary nodule 05/27/2020   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   Chronic patellofemoral pain of right knee 05/05/2020   Positive ANA (antinuclear antibody) 06/05/2019   Status post total  replacement of left hip 03/12/2019   Unilateral primary osteoarthritis, left hip 01/28/2019   Common bile duct (CBD) obstruction    Venous insufficiency of left lower extremity 07/04/2018   Dyslipidemia 09/22/2017   DDD (degenerative disc disease), cervical 04/03/2017   DDD (degenerative disc disease), lumbar 04/03/2017   Onychomycosis 03/21/2017   Dysphagia 10/18/2016   Encounter for chronic pain management 10/18/2016   Biliary stasis 09/26/2016   Dry mouth 06/06/2016   HNP (herniated nucleus pulposus), lumbar 09/23/2015   Iron deficiency 07/30/2015   Health maintenance examination 06/15/2015   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   Clavicle enlargement 12/13/2014   Prediabetes 12/13/2014   Plantar fasciitis, bilateral 09/11/2014   Medicare annual wellness visit, subsequent 06/10/2014   Abnormal thyroid function test 06/10/2014   Chronic pain syndrome 06/10/2014   CFS (chronic fatigue syndrome) 04/08/2014   Postmenopausal atrophic vaginitis 10/19/2012   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   Cervical disc disorder with radiculopathy of cervical region 05/28/2010   APHTHOUS ULCERS 01/31/2008   Insomnia 11/09/2007   Constipation 10/11/2007   Allergic rhinitis 04/12/2007   MDD (major depressive disorder), recurrent episode, moderate (HCC) 03/05/2007   [name] syndrome 03/05/2007   GERD 03/05/2007   ROSACEA 03/05/2007   NEURALGIA 03/05/2007   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 C6- C7 -hx. past fusion(bone graft used)   Cholecystitis    Chronic pain    DDD (degenerative disc disease), lumbar 09/2015   dextroscoliosis with multilevel DDD  and facet arthrosis most notable for R foraminal disc protrusion L4/5 producing severe R neural foraminal stenosis abutting R L4 nerve root, moderate spinal canal and mild lat recesss and R neural foraminal stenosis L3/4 (MRI)   Depression    bipolar depression   Disorders of porphyrin metabolism    Felon of finger of left hand 11/10/2016   Fibromyalgia    GERD (gastroesophageal reflux disease)    Headache    occasionally   Internal hemorrhoids    Interstitial cystitis 06/06/2012   hx.   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)   Tuberculosis     Family History  Problem Relation Age of Onset   CAD Father 94       MI, nonsmoker   Esophageal cancer Father 50   Stomach cancer  Father    Scleroderma Mother    Hypertension Mother    Esophageal cancer Paternal Grandfather    Stomach cancer Paternal Grandfather    Diabetes Maternal Grandmother    Arthritis Brother    Stroke Neg Hx    Colon cancer Neg Hx    Rectal cancer Neg Hx    Past Surgical History:  Procedure Laterality Date   ANTERIOR CERVICAL DECOMP/DISCECTOMY FUSION  2004   C5/6, C6/7   ANTERIOR CERVICAL DECOMP/DISCECTOMY FUSION  02/2016   C3/4, C4/5 with plating Lovell Sheehan)   ANTERIOR CERVICAL DECOMP/DISCECTOMY FUSION  12/29/2021   Procedure: ANTERIOR CERVICAL DECOMPRESSION/ DISCECTOMY FUSION CERVICAL THREE - SEVEN;  Surgeon: Tressie Stalker, MD;  [facility] OR;  Service: Neurosurgery;;   ANTERIOR CERVICAL DECOMPRESSION/DISCECTOMY FUSION 4 LEVELS N/A 12/29/2021    Procedure: CERVICAL FIVE-SIX CORPECTOMY,;  Surgeon: Tressie Stalker, MD;  Location: [facility] OR;  Service: Neurosurgery;  Laterality: N/A;   AUGMENTATION MAMMAPLASTY Bilateral 03/25/2010   BREAST ENHANCEMENT SURGERY  2010   BREAST IMPLANT EXCHANGE  10/2014   exchange saline implants, B mastopexy/capsulorraphy ([facility])   BUNIONECTOMY Bilateral yrs ago   CESAREAN SECTION  1985   x 1   CHOLECYSTECTOMY  06/11/2012   Procedure: LAPAROSCOPIC CHOLECYSTECTOMY WITH INTRAOPERATIVE CHOLANGIOGRAM;  Surgeon: Atilano Ina, MD,FACS;  Location: [facility] ORS;  Service: General;  Laterality: N/A;   COLONOSCOPY  02/2018   done for positive cologuard - WNL, rpt 10 yrs Russella Dar)   CYSTOSCOPY     ERCP  05/22/2012   Procedure: ENDOSCOPIC RETROGRADE CHOLANGIOPANCREATOGRAPHY (ERCP);  Surgeon: Meryl Dare, MD,FACG;  Location: [facility];  Service: Endoscopy;  Laterality: N/A;   ERCP N/A 09/17/2013   Procedure: ENDOSCOPIC RETROGRADE CHOLANGIOPANCREATOGRAPHY (ERCP);  Surgeon: Meryl Dare, MD;  Location: [facility];  Service: Endoscopy;  Laterality: N/A;   ERCP N/A 09/27/2018   Procedure: ENDOSCOPIC RETROGRADE CHOLANGIOPANCREATOGRAPHY (ERCP);  Surgeon: Meryl Dare, MD;  Location: [facility];  Service: Endoscopy;  Laterality: N/A;   ESOPHAGOGASTRODUODENOSCOPY  09/2016   WNL. esophagus dilated Russella Dar)   HEMORRHOID BANDING  09/23/2013   --Dr. Gaynelle Adu   HERNIA REPAIR     inguinal   HYSTEROSCOPY W/ ENDOMETRIAL ABLATION     KNEE ARTHROSCOPY Right 04/06/2021   Procedure: RIGHT KNEE ARTHROSCOPY WITH DEBRIDEMENT;  Surgeon: Cammy Copa, MD;  Location: [facility];  Service: Orthopedics;  Laterality: Right;   NASAL SINUS SURGERY     x5   PARTIAL KNEE ARTHROPLASTY Right 06/01/2020   Procedure: Right knee patellofemoral replacement;  Surgeon: Cammy Copa, MD;  Location: [facility];  Service: Orthopedics;  Laterality: Right;   POSTERIOR CERVICAL  FUSION/FORAMINOTOMY N/A 12/29/2021   Procedure: POSTERIOR CERVICAL FUSION CERVICAL THREE-SEVEN;  Surgeon: Tressie Stalker, MD;  Location[facility] OR;  Service: Neurosurgery;  Laterality: N/A;   REMOVAL OF STONES  09/27/2018   Procedure: REMOVAL OF STONES;  Surgeon: Meryl Dare, MD;  Location: [facility];  Service: Endoscopy;;   SPHINCTEROTOMY  09/27/2018   Procedure: SPHINCTEROTOMY;  Surgeon: Meryl Dare, MD;  Location: [facility];  Service: Endoscopy;;   TONSILLECTOMY     removed as a child   TOTAL HIP ARTHROPLASTY Left 03/12/2019   Procedure: LEFT TOTAL HIP ARTHROPLASTY ANTERIOR APPROACH;  Magnus Ivan, Vanita Panda, MD)   UPPER GASTROINTESTINAL ENDOSCOPY     Social History   Social History Narrative   Lives with husband and dog   Caring for 3 grandchildren after daughter Cala Bradford) was killed 05/2022.   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   Activity: tries to walk dog (45 min 3x/wk), yoga   Diet: good water, fruits/vegetables daily      Rheum: [name]   Psychiatrist: Evelene Croon   Surgery: Andrey Campanile   GIRussella Dar   Urology: Logan Bores   Immunization History  Administered Date(s) Administered   COVID-19, mRNA, vaccine(Comirnaty)12 years and older 05/25/2022   Influenza Split 05/04/2011, 04/04/2012   Influenza Whole 05/25/2007, 04/28/2008, 04/30/2009, 03/26/2010   Influenza,inj,Quad PF,6+ Mos 04/16/2015, 04/15/2016, 05/16/2017, 04/10/2018, 04/19/2019, 04/07/2020, 04/20/2022   Influenza-Unspecified 03/25/2014   MMR 11/28/2017   Moderna Sars-Covid-2 Vaccination 09/02/2019, 09/30/2019, 04/07/2020   Pneumococcal Conjugate-13 08/02/2013   Pneumococcal Polysaccharide-23 07/30/2009   Td 11/01/2005, 11/10/2016   Zoster Recombinant(Shingrix) 07/21/2017, 11/03/2017     Objective: Vital Signs: BP (!) 153/85 (BP Location: Left Arm, Patient Position: Sitting, Cuff Size: Normal)   Pulse 80   Resp 14   Ht 5\' 3"  (1.6 m)   Wt 116 lb (52.6 kg)   LMP  07/25/1998   BMI 20.55 kg/m    Physical Exam Vitals and nursing note reviewed.  Constitutional:      Appearance: She is well-developed.  HENT:     Head: Normocephalic and atraumatic.  Eyes:     Conjunctiva/sclera: Conjunctivae normal.  Cardiovascular:     Rate and Rhythm: Normal rate and regular rhythm.     Heart sounds: Normal heart sounds.  Pulmonary:     Effort: Pulmonary effort is normal.     Breath sounds: Normal breath sounds.  Abdominal:     General: Bowel sounds are normal.     Palpations: Abdomen is soft.  Musculoskeletal:     Cervical back: Normal range of motion.  Skin:    General: Skin is warm and dry.     Capillary Refill: Capillary refill takes less than 2 seconds.     Comments: No digital ulcerations or signs of gangrene.  No signs of sclerodactyly noted at this time.  Neurological:     Mental Status: She is alert and oriented to person, place, and time.  Psychiatric:        Behavior: Behavior normal.      Musculoskeletal Exam: C-spine has severely limited range of motion.  Thoracolumbar scoliosis noted.  Shoulder joints, elbow joints, wrist joints, MCPs, PIPs, DIPs have good range of motion with no synovitis.  PIP and DIP thickening consistent with osteoarthritis of both hands.  Subluxation of several DIP joints.  Left hip replacement has good range of motion with no groin pain.  Right knee replacement has good range of motion with some discomfort but no effusion noted.  Left knee joint has no warmth or effusion.  Ankle joints have good range of motion with no tenderness or joint swelling.  CDAI Exam: CDAI Score: -- Patient Global: --; Provider Global: -- Swollen: --; Tender: -- Joint Exam 02/06/2023   No joint exam has been documented for this visit   There is currently no information documented on the homunculus. Go to the Rheumatology activity and complete the homunculus joint exam.  Investigation: No additional findings.  Imaging: No results  found.  Recent Labs: Lab Results  Component Value Date   WBC 8.5 09/26/2022   HGB 12.5 09/26/2022   PLT 320.0 09/26/2022   NA 134 (L) 09/26/2022   K 4.8 09/26/2022   CL 98 09/26/2022   CO2 29 09/26/2022   GLUCOSE 95 09/26/2022   BUN 7 09/26/2022   CREATININE 0.48 09/26/2022  BILITOT 0.4 09/26/2022   ALKPHOS 108 09/26/2022   AST 17 09/26/2022   ALT 9 09/26/2022   PROT 6.4 09/26/2022   ALBUMIN 4.1 09/26/2022   CALCIUM 9.2 09/26/2022   GFRAA >60 03/13/2019    Speciality Comments: Amlodipine 5mg  2.5 mg because tablets crumble.   Procedures:  No procedures performed Allergies: Celebrex [celecoxib], Inh [isoniazid], Cymbalta [duloxetine hcl], Nucynta [tapentadol], Silenor [doxepin hcl], Benzoin, Duragesic-100 [fentanyl], Keflex [cephalexin], Codeine phosphate, Demerol [meperidine hcl], Lithobid [lithium], and Sulfamethoxazole   Assessment / Plan:     Visit Diagnoses: 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HCC) - 10/06/21: AVISE lupus index -1.1, ANA positive, titer negative, dsDNA equivocal, (RNP, Ro, SSB, Smith, centromere, SCL 70, RNA polymerase 3, Jo 1, CB Negative). Discussed the importance of close blood pressure monitoring--Her blood pressure was elevated today in the office at 153/85.  Patient was advised to monitor her blood pressure closely and to reach out to her PCP if it remains elevated--discussed the increased risk for renal crisis if her blood pressure remains elevated.  She remains on amlodipine 7.5 mg daily.  Her symptoms of [name] phenomenon have been well-controlled on the current treatment regimen.  She has not needed to use nitroglycerin ointment recently.  No sclerodactyly noted.  No digital ulcerations or signs of gangrene were noted.  Capillary refill was less than 2 seconds on examination today. High-resolution chest CT updated on 11/07/2022: No findings suggestive of ILD--monitored by Dr. Isaiah Serge. Patient continues to see Dr. Gala Romney every December to monitor for  pulmonary hypertension.  Last echocardiogram was performed on 06/29/2022-no evidence of PAH or RV strain. Patient experiences dysphagia with solids especially meats which she attributes to her previous discectomy.  She has not noticed any other new or worsening gastrointestinal symptoms.  She has not had any symptoms of GERD recently. Patient was advised to notify us if she develops any new or worsening symptoms.  She will follow-up in the office in 5 months or sooner if needed.  [name] disease without gangrene -She remains on amlodipine 7.5 mg daily.  No signs of sclerodactyly noted.  Capillary refill less than 2 seconds.  Patient was advised to notify us if she develops any new or worsening symptoms.  Primary osteoarthritis of both hands: PIP and DIP thickening consistent with osteoarthritis of both hands.  Discussed the importance of joint protection and muscle strengthening.  S/P hip replacement, left: Doing well.  Good range of motion with no groin pain currently.  Primary osteoarthritis of left knee: Good ROM of the left knee with no warmth or effusion.   Status post right partial knee replacement: Chronic pain.  No warmth or effusion noted.   DDD (degenerative disc disease), cervical: History of cervical fusion performed by Dr. Lovell Sheehan.  She has been experiencing increased pain and stiffness in the C-spine. Upcoming appointment with Dr. Lovell Sheehan scheduled later this month.  DDD (degenerative disc disease), lumbar: Chronic pain.  Severe scoliosis.  Previously under the care of Dr. Berline Chough for pain management.  She remains on Lyrica as prescribed.  Other form of scoliosis of thoracolumbar spine: She is been experiencing increased pain and stiffness in thoracolumbar spine due to underlying scoliosis.  She has upcoming appoint with Dr. Lovell Sheehan scheduled.  Osteopenia of neck of right femur - October 06, 2021 the BMD measured at [facility] is 0.704 g/cm2 with a T-score of-2.4, BMD 0.704.  Her DXA  followed by her GYN.  Fibromyalgia: She has been experiencing increased myalgias, arthralgias, and fatigue.  Her fibromyalgia has  been more active which she attributes to being under increased stress.  She is caring for her 3 grandchildren who now lives with her since the loss of her daughter.  She has been unable to perform her normal exercise regimen due to the change in her schedule.  She remains active with her church community and has started grief counseling.  Chronic pain syndrome - Previously under the care of Dr. Berline Chough.  She remains on Lyrica as prescribed.   Other medical conditions are listed as follows:  Other fatigue  Prediabetes  Hx of porphyria  History of bipolar disorder  History of gastroesophageal reflux (GERD)  CFS (chronic fatigue syndrome)  Family history of scleroderma  Orders: No orders of the defined types were placed in this encounter.  No orders of the defined types were placed in this encounter.    Follow-Up Instructions: Return in about 5 months (around 07/09/2023) for Limited systemic sclerosis, DDD.   Gearldine Bienenstock, PA-C  Note - This record has been created using Dragon software.  Chart creation errors have been sought, but may not always  have been located. Such creation errors do not reflect on  the standard of medical care.

## 2023-01-29 ENCOUNTER — Encounter: Payer: Self-pay | Admitting: Obstetrics and Gynecology

## 2023-01-29 DIAGNOSIS — R6882 Decreased libido: Secondary | ICD-10-CM

## 2023-01-31 ENCOUNTER — Other Ambulatory Visit: Payer: Self-pay | Admitting: Physician Assistant

## 2023-01-31 ENCOUNTER — Other Ambulatory Visit: Payer: Self-pay | Admitting: Family Medicine

## 2023-01-31 MED ORDER — NONFORMULARY OR COMPOUNDED ITEM
0 refills | Status: DC
Start: 2023-01-31 — End: 2023-05-08

## 2023-01-31 NOTE — Telephone Encounter (Signed)
Tizanidine Last filled:  12/24/21, #30 Last OV:  12/27/22, pain mgmt f/u Next OV:  04/07/23, 3 mo pain mgmt f/u

## 2023-01-31 NOTE — Telephone Encounter (Signed)
Last Fill: 11/01/2022  Next Visit: 02/06/2023  Last Visit: 09/06/2022  Dx: [name] disease without gangrene   Current Dose per office note on 09/12/2022: Norvasc 7.5 mg QD   Okay to refill Amlodipine?

## 2023-01-31 NOTE — Telephone Encounter (Signed)
Rx called into [facility] to [facility].

## 2023-02-01 NOTE — Telephone Encounter (Signed)
ERx 

## 2023-02-03 ENCOUNTER — Encounter: Payer: Self-pay | Admitting: Family Medicine

## 2023-02-06 ENCOUNTER — Encounter: Payer: Self-pay | Admitting: Physician Assistant

## 2023-02-06 ENCOUNTER — Ambulatory Visit: Payer: Medicare Other | Attending: Physician Assistant | Admitting: Physician Assistant

## 2023-02-06 VITALS — BP 153/85 | HR 80 | Resp 14 | Ht 63.0 in | Wt 116.0 lb

## 2023-02-06 DIAGNOSIS — R7303 Prediabetes: Secondary | ICD-10-CM

## 2023-02-06 DIAGNOSIS — M1712 Unilateral primary osteoarthritis, left knee: Secondary | ICD-10-CM

## 2023-02-06 DIAGNOSIS — Z8269 Family history of other diseases of the musculoskeletal system and connective tissue: Secondary | ICD-10-CM

## 2023-02-06 DIAGNOSIS — M797 Fibromyalgia: Secondary | ICD-10-CM

## 2023-02-06 DIAGNOSIS — M349 Systemic sclerosis, unspecified: Secondary | ICD-10-CM

## 2023-02-06 DIAGNOSIS — M5136 Other intervertebral disc degeneration, lumbar region: Secondary | ICD-10-CM

## 2023-02-06 DIAGNOSIS — R5383 Other fatigue: Secondary | ICD-10-CM

## 2023-02-06 DIAGNOSIS — Z8719 Personal history of other diseases of the digestive system: Secondary | ICD-10-CM

## 2023-02-06 DIAGNOSIS — Z96642 Presence of left artificial hip joint: Secondary | ICD-10-CM

## 2023-02-06 DIAGNOSIS — M19041 Primary osteoarthritis, right hand: Secondary | ICD-10-CM | POA: Diagnosis not present

## 2023-02-06 DIAGNOSIS — G894 Chronic pain syndrome: Secondary | ICD-10-CM

## 2023-02-06 DIAGNOSIS — I7300 Raynaud's syndrome without gangrene: Secondary | ICD-10-CM

## 2023-02-06 DIAGNOSIS — G9332 Myalgic encephalomyelitis/chronic fatigue syndrome: Secondary | ICD-10-CM

## 2023-02-06 DIAGNOSIS — M19042 Primary osteoarthritis, left hand: Secondary | ICD-10-CM

## 2023-02-06 DIAGNOSIS — Z8659 Personal history of other mental and behavioral disorders: Secondary | ICD-10-CM

## 2023-02-06 DIAGNOSIS — Z8639 Personal history of other endocrine, nutritional and metabolic disease: Secondary | ICD-10-CM

## 2023-02-06 DIAGNOSIS — M85851 Other specified disorders of bone density and structure, right thigh: Secondary | ICD-10-CM

## 2023-02-06 DIAGNOSIS — Z96651 Presence of right artificial knee joint: Secondary | ICD-10-CM

## 2023-02-06 DIAGNOSIS — M4185 Other forms of scoliosis, thoracolumbar region: Secondary | ICD-10-CM

## 2023-02-06 DIAGNOSIS — M5030 Other cervical disc degeneration, unspecified cervical region: Secondary | ICD-10-CM

## 2023-02-09 NOTE — Telephone Encounter (Signed)
Can we request paper chart to review porphyria diagnosis (prior to 2008)?

## 2023-02-13 NOTE — Telephone Encounter (Signed)
Per result note from 01/18/2023: pt notified of results and refill for medication sent. Will route to provider for final review and close.

## 2023-02-20 ENCOUNTER — Other Ambulatory Visit: Payer: Self-pay | Admitting: Physician Assistant

## 2023-02-20 ENCOUNTER — Other Ambulatory Visit: Payer: Self-pay | Admitting: Family Medicine

## 2023-02-20 DIAGNOSIS — G894 Chronic pain syndrome: Secondary | ICD-10-CM

## 2023-02-20 NOTE — Telephone Encounter (Signed)
Last Fill: 12/12/2022  Next Visit: 07/27/2023  Last Visit: 02/06/2023  Dx: [name] disease without gangrene   Current Dose per office note on 02/06/2023: amlodipine 7.5 mg daily   Okay to refill Amlodipine?

## 2023-02-20 NOTE — Telephone Encounter (Signed)
Name of Medication: MSIR Name of Pharmacy: [facility] Last Fill or Written Date and Quantity: 11/22/22, #90 Last Office Visit and Type:  12/27/22, 3 mo pain mgmt f/u  Next Office Visit and Type:  04/07/23, 3 mo pain mgmt f/u Last Controlled Substance Agreement Date: 04/19/19 Last UDS: 04/19/19

## 2023-02-22 NOTE — Telephone Encounter (Signed)
ERx 

## 2023-03-03 NOTE — Telephone Encounter (Signed)
Did we ever request paper copy to review? I haven't received it as of yet. Thanks.

## 2023-03-06 ENCOUNTER — Other Ambulatory Visit: Payer: Self-pay | Admitting: Family Medicine

## 2023-03-06 DIAGNOSIS — K5904 Chronic idiopathic constipation: Secondary | ICD-10-CM

## 2023-03-06 NOTE — Telephone Encounter (Signed)
[facility] Last filled:  11/01/22, #90 Last OV:  12/27/22, 3 mo chronic pain mgmt Next OV:  04/07/23, 3 mo f/u

## 2023-03-07 NOTE — Telephone Encounter (Signed)
ERx 

## 2023-03-09 ENCOUNTER — Encounter (INDEPENDENT_AMBULATORY_CARE_PROVIDER_SITE_OTHER): Payer: Self-pay

## 2023-03-21 ENCOUNTER — Other Ambulatory Visit: Payer: Self-pay | Admitting: Family Medicine

## 2023-03-21 DIAGNOSIS — G9332 Myalgic encephalomyelitis/chronic fatigue syndrome: Secondary | ICD-10-CM

## 2023-03-21 NOTE — Telephone Encounter (Signed)
LOV 12/27/22  Last refill 12/21/22 #90 w/ 0 refill   NOV 04/10/23

## 2023-03-22 NOTE — Telephone Encounter (Signed)
ERx 

## 2023-03-23 ENCOUNTER — Encounter: Payer: Self-pay | Admitting: Family Medicine

## 2023-04-05 ENCOUNTER — Other Ambulatory Visit: Payer: Self-pay | Admitting: Family Medicine

## 2023-04-05 DIAGNOSIS — G894 Chronic pain syndrome: Secondary | ICD-10-CM

## 2023-04-05 NOTE — Telephone Encounter (Signed)
Name of Medication: MSIR Name of Pharmacy: [facility] Last Fill or Written Date and Quantity: 02/22/23, #90 Last Office Visit and Type:  12/27/22, 3 mo pain mgmt f/u  Next Office Visit and Type:  04/10/23, 3 mo pain mgmt f/u Last Controlled Substance Agreement Date: 04/19/19 Last UDS: 04/19/19

## 2023-04-06 NOTE — Telephone Encounter (Signed)
ERx 

## 2023-04-07 ENCOUNTER — Ambulatory Visit: Payer: Medicare Other | Admitting: Family Medicine

## 2023-04-10 ENCOUNTER — Ambulatory Visit (INDEPENDENT_AMBULATORY_CARE_PROVIDER_SITE_OTHER): Payer: Medicare Other | Admitting: Family Medicine

## 2023-04-10 ENCOUNTER — Encounter: Payer: Self-pay | Admitting: Family Medicine

## 2023-04-10 VITALS — BP 134/76 | HR 102 | Temp 97.6°F | Ht 63.0 in | Wt 115.0 lb

## 2023-04-10 DIAGNOSIS — S12490G Other displaced fracture of fifth cervical vertebra, subsequent encounter for fracture with delayed healing: Secondary | ICD-10-CM

## 2023-04-10 DIAGNOSIS — L601 Onycholysis: Secondary | ICD-10-CM

## 2023-04-10 DIAGNOSIS — I7300 Raynaud's syndrome without gangrene: Secondary | ICD-10-CM

## 2023-04-10 DIAGNOSIS — Z23 Encounter for immunization: Secondary | ICD-10-CM | POA: Diagnosis not present

## 2023-04-10 DIAGNOSIS — S12501G Unspecified nondisplaced fracture of sixth cervical vertebra, subsequent encounter for fracture with delayed healing: Secondary | ICD-10-CM

## 2023-04-10 DIAGNOSIS — M47812 Spondylosis without myelopathy or radiculopathy, cervical region: Secondary | ICD-10-CM

## 2023-04-10 DIAGNOSIS — M5010 Cervical disc disorder with radiculopathy, unspecified cervical region: Secondary | ICD-10-CM

## 2023-04-10 DIAGNOSIS — G8929 Other chronic pain: Secondary | ICD-10-CM

## 2023-04-10 DIAGNOSIS — M349 Systemic sclerosis, unspecified: Secondary | ICD-10-CM

## 2023-04-10 DIAGNOSIS — G894 Chronic pain syndrome: Secondary | ICD-10-CM

## 2023-04-10 DIAGNOSIS — M797 Fibromyalgia: Secondary | ICD-10-CM

## 2023-04-10 DIAGNOSIS — M4322 Fusion of spine, cervical region: Secondary | ICD-10-CM

## 2023-04-10 MED ORDER — LINACLOTIDE 290 MCG PO CAPS: 290.0000 ug | ORAL_CAPSULE | Freq: Every day | ORAL | 2 refills | Status: DC

## 2023-04-10 NOTE — Progress Notes (Signed)
Ph: [phone] Fax: [phone]   Patient ID: Rachel Duran, female    DOB: 15-May-1956, 67 y.o.   MRN: [id number]  This visit was conducted in person.  BP 134/76   Pulse (!) 102   Temp 97.6 F (36.4 C) (Temporal)   Ht 5\' 3"  (1.6 m)   Wt 115 lb (52.2 kg)   LMP 07/25/1998   SpO2 99%   BMI 20.37 kg/m    CC: 3 mo pain management f/u  Subjective:   HPI: Rachel Duran is a 67 y.o. female presenting on 04/10/2023 for Medical Management of Chronic Issues (Here for 3 mo pain mgmt f/u. )   Cares for 3 grandchildren, one with autism.  Chronic pain on MSIR 15mg  Q6 ours PRN (normally BID), lyrica 150mg  BID, lidocaine patches. H/o multiple spine surgeries with residual pain, latest displaced clavicle followed by cervical spine fracture ()C5/6 fracture subluxation s/p repair after fall. H/o prior cervical spine fusion. Notes ongoing neck pain/stiffness - with significant pain. Planning to start PT course through neuro PT in [place]. Sees [facility] for cervical radiculopathy.   Chronic fatigue on adderall XR 30mg  daily.  Known limited systemic sclerosis, OA, raynaud's and fibromyalgia followed by rheumatology, latest seen 01/2023. 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   High res CT chest 10/2022 showed ATH of aorta and CAD, no signs of ILD. Upcoming PFTs then will follow up with Dr Isaiah Serge. Planning to see Dr Gala Romney 06/2022.  Since summer notes increased congestion, with cough and rattling in chest whenever she eats ice cream ie cold     Relevant past medical, surgical, family and social history reviewed and updated as indicated. Interim medical history since our last visit reviewed. Allergies and medications reviewed and updated. Outpatient Medications Prior to Visit  Medication Sig Dispense Refill   ALPRAZolam (XANAX) 1 MG tablet Take 0.5 tablets (0.5 mg total) by mouth at bedtime. 30 tablet 0   amLODipine  (NORVASC) 2.5 MG tablet TAKE 1 TABLET BY MOUTH DAILY 90 tablet 0   amLODipine (NORVASC) 5 MG tablet TAKE 1 TABLET BY MOUTH DAILY 90 tablet 0   amphetamine-dextroamphetamine (ADDERALL XR) 30 MG 24 hr capsule TAKE 1 CAPSULE BY MOUTH EVERY MORNING 90 capsule 0   ARIPiprazole (ABILIFY) 5 MG tablet Take 5 mg by mouth at bedtime.     Calcium Carbonate-Vitamin D (CALTRATE 600+D PO) Take 2 tablets by mouth daily.     clindamycin (CLEOCIN) 150 MG capsule Take 600mg  1 hour prior to dental procedure 10 capsule 0   cyanocobalamin (VITAMIN B12) 1000 MCG/ML injection INJECT INTO THE MUSCLE EVERY 21 DAYSAS DIRECTED 3 mL 3   Eszopiclone 3 MG TABS Take 1 tablet (3 mg total) by mouth at bedtime. Take immediately before bedtime 90 tablet 0   ferrous sulfate 325 (65 FE) MG tablet Take 1 tablet (325 mg total) by mouth every Monday, Wednesday, and Friday. 30 tablet 3   lamoTRIgine (LAMICTAL) 200 MG tablet Take 1 tablet (200 mg total) by mouth daily. 30 tablet 0   lidocaine (LIDODERM) 5 % Place 3 patches onto the skin daily. Remove & Discard patch within 12 hours or as directed by MD 60 patch 0   lidocaine (XYLOCAINE) 5 % ointment Apply 1 Application topically as needed. Apply up to 4x/day- on LUE for nerve pain 50 g 6   morphine (MSIR) 15 MG tablet TAKE ONE TABLET BY MOUTH EVERY 6 HOURS AS NEEDED  FOR SEVERE PAIN 90 tablet 0   MOVANTIK 25 MG TABS tablet TAKE ONE TABLET BY MOUTH EVERY DAY 90 tablet 0   NONFORMULARY OR COMPOUNDED ITEM Testosterone proprionate petrolatum (jar) 2% ointment  2 Grams S: Apply a small amount once daily x 5 days per week. 2 each 0   ondansetron (ZOFRAN) 4 MG tablet TAKE 1 TABLET BY MOUTH EVERY 8 HOURS AS NEEDED FOR NAUSEA AND VOMITING 30 tablet 0   pantoprazole (PROTONIX) 40 MG tablet TAKE ONE TABLET TWICE A DAY WITH MEALS 180 tablet 3   polyethylene glycol (MIRALAX / GLYCOLAX) 17 g packet Take 17 g by mouth 2 (two) times daily.     Prasterone (INTRAROSA) 6.5 MG INST UNWRAP AND INSERT 1  SUPPOSITORY  VAGINALLY WITH PROVIDED  APPLICATOR AT BEDTIME 90 each 0   pregabalin (LYRICA) 150 MG capsule Take 150 mg by mouth 2 (two) times daily.     tiZANidine (ZANAFLEX) 2 MG tablet TAKE 1 TABLET BY MOUTH EVERY 8 HOURS AS NEEDED 30 tablet 0   ursodiol (ACTIGALL) 300 MG capsule TAKE 1 CAPSULE BY MOUTH TWICE DAILY 180 capsule 3   linaclotide (LINZESS) 290 MCG CAPS capsule Take 1 capsule (290 mcg total) by mouth daily. 90 capsule 2   No facility-administered medications prior to visit.     Per HPI unless specifically indicated in ROS section below Review of Systems  Objective:  BP 134/76   Pulse (!) 102   Temp 97.6 F (36.4 C) (Temporal)   Ht 5\' 3"  (1.6 m)   Wt 115 lb (52.2 kg)   LMP 07/25/1998   SpO2 99%   BMI 20.37 kg/m   Wt Readings from Last 3 Encounters:  04/10/23 115 lb (52.2 kg)  02/06/23 116 lb (52.6 kg)  12/27/22 112 lb 2 oz (50.9 kg)      Physical Exam Vitals and nursing note reviewed.  Constitutional:      Appearance: Normal appearance. She is normal weight. She is not ill-appearing.  HENT:     Mouth/Throat:     Mouth: Mucous membranes are moist.     Pharynx: Oropharynx is clear. No oropharyngeal exudate or posterior oropharyngeal erythema.  Eyes:     Extraocular Movements: Extraocular movements intact.     Pupils: Pupils are equal, round, and reactive to light.  Cardiovascular:     Rate and Rhythm: Normal rate and regular rhythm.     Pulses: Normal pulses.     Heart sounds: Normal heart sounds. No murmur heard. Pulmonary:     Effort: Pulmonary effort is normal. No respiratory distress.     Breath sounds: Normal breath sounds. No wheezing, rhonchi or rales.  Musculoskeletal:     Right lower leg: No edema.     Left lower leg: No edema.  Skin:    General: Skin is warm and dry.     Findings: No rash.     Comments: Distal nail plate separation from nailbed to bilateral index fingers  Neurological:     Mental Status: She is alert.  Psychiatric:         Mood and Affect: Mood normal.        Behavior: Behavior normal.       Results for orders placed or performed in visit on 01/18/23  Testos,Total,Free and SHBG (Female)  Result Value Ref Range   Testosterone, Total, LC-MS-MS 13 2 - 45 ng/dL   Free Testosterone 0.7 0.1 - 6.4 pg/mL   Sex Hormone Binding 130.2 (H) 14 -  73 nmol/L   *Note: Due to a large number of results and/or encounters for the requested time period, some results have not been displayed. A complete set of results can be found in Results Review.    Assessment & Plan:   Problem List Items Addressed This Visit     Encounter for chronic pain management - Primary (Chronic)    [place] CSRS reviewed.  Tolerating pain regimen well, it is effective for pain control. Will continue this.       [name] syndrome    Possible worsening to R 1st/2nd digits. She continues amlodipine 7.5mg  daily.       Cervical disc disorder with radiculopathy of cervical region    Ongoing pain after complicated fall with fracture s/p repair.  Planning to start PT course.  Hesitant for trigger point injections.       Fibromyalgia   Chronic pain syndrome   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HCC)   C5 cervical fracture (HCC)   Cervical spine arthritis with nerve pain   C6 cervical fracture (HCC)   Fusion of spine, cervical region   Onycholysis    She will check with derm at upcoming appt       Other Visit Diagnoses     Encounter for immunization       Relevant Orders   Flu Vaccine Trivalent High Dose (Fluad) (Completed)        Meds ordered this encounter  Medications   linaclotide (LINZESS) 290 MCG CAPS capsule    Sig: Take 1 capsule (290 mcg total) by mouth daily.    Dispense:  90 capsule    Refill:  2    Orders Placed This Encounter  Procedures   Flu Vaccine Trivalent High Dose (Fluad)    Patient Instructions  Flu shot today Reasonable to get repeat COVID shot. You are doing well today Return in 3 months for chronic pain  follow up visit.   Follow up plan: Return in about 3 months (around 07/10/2023) for follow up visit.  Eustaquio Boyden, MD

## 2023-04-10 NOTE — Assessment & Plan Note (Signed)
She will check with derm at upcoming appt

## 2023-04-10 NOTE — Assessment & Plan Note (Signed)
Ongoing pain after complicated fall with fracture s/p repair.  Planning to start PT course.  Hesitant for trigger point injections.

## 2023-04-10 NOTE — Patient Instructions (Addendum)
Flu shot today Reasonable to get repeat COVID shot. You are doing well today Return in 3 months for chronic pain follow up visit.

## 2023-04-10 NOTE — Assessment & Plan Note (Signed)
[place] CSRS reviewed.  Tolerating pain regimen well, it is effective for pain control. Will continue this.

## 2023-04-10 NOTE — Assessment & Plan Note (Signed)
Possible worsening to R 1st/2nd digits. She continues amlodipine 7.5mg  daily.

## 2023-04-17 ENCOUNTER — Encounter: Payer: Self-pay | Admitting: Family Medicine

## 2023-04-17 ENCOUNTER — Other Ambulatory Visit: Payer: Self-pay | Admitting: Family Medicine

## 2023-04-17 DIAGNOSIS — G4700 Insomnia, unspecified: Secondary | ICD-10-CM

## 2023-04-17 MED ORDER — ESZOPICLONE 3 MG PO TABS
3.0000 mg | ORAL_TABLET | Freq: Every day | ORAL | 0 refills | Status: DC
Start: 2023-04-17 — End: 2023-07-23

## 2023-04-17 NOTE — Telephone Encounter (Signed)
Eszopiclone Last rx:  01/03/23, #90 Last OV:  04/10/23, pain mgmt f/u Next OV:  07/10/23, 3 mo pain mgmt f/u

## 2023-04-21 ENCOUNTER — Encounter: Payer: Self-pay | Admitting: Orthopedic Surgery

## 2023-04-24 ENCOUNTER — Other Ambulatory Visit: Payer: Self-pay

## 2023-04-24 ENCOUNTER — Other Ambulatory Visit: Payer: Self-pay | Admitting: Surgical

## 2023-04-24 MED ORDER — CLINDAMYCIN HCL 150 MG PO CAPS: ORAL_CAPSULE | ORAL | 0 refills | Status: DC

## 2023-04-24 NOTE — Telephone Encounter (Signed)
Send in clinda, which she had before

## 2023-04-24 NOTE — Progress Notes (Signed)
67 y.o. G41P2002 [name] here for annual exam.    Using Intrarosa and testosterone cream.  She wants to continue.  Her testosterone levels were:  total testosterone 13, free testosterone 0.7 on 01/18/23.   Her daughter was the victim of homicide and domestic violence.  She and her husband are caring for her daughter's 3 children.  The children are in therapy, and she and her husband are starting grief therapy.   PCP:  Dr. Sharen Hones   Patient's last menstrual period was 07/25/1998.           Sexually active: Yes.    The current method of family planning is post menopausal status.    Exercising: Yes.     walking Smoker:  no  Health Maintenance: Pap:  05/19/21 neg: HR HPV neg, 01/31/19 neg History of abnormal Pap:  no MMG:  12/13/22 - BI-RADS1.  Category C density.  Colonoscopy:  09/27/18 BMD:   10/06/21  Result  osteopenic TDaP:  11/10/16 Gardasil:   no HIV: unsure Hep C: 07/16/15 neg Screening Labs:  PCP   reports that she has never smoked. She has never been exposed to tobacco smoke. She has never used smokeless tobacco. She reports that she does not drink alcohol and does not use drugs.  Past Medical History:  Diagnosis Date   Abdominal pain last 4 months   and nausea also   Allergy    Anemia    history of   Anxiety    Bipolar disorder (HCC)    atpical bipolar disorder   Cervical disc disease limited rom turning to left   hx. C6- C7 -hx. past fusion(bone graft used)   Cholecystitis    Chronic pain    DDD (degenerative disc disease), lumbar 09/2015   dextroscoliosis with multilevel DDD and facet arthrosis most notable for R foraminal disc protrusion L4/5 producing severe R neural foraminal stenosis abutting R L4 nerve root, moderate spinal canal and mild lat recesss and R neural foraminal stenosis L3/4 (MRI)   Depression    bipolar depression   Disorders of porphyrin metabolism    Felon of finger of left hand 11/10/2016   Fibromyalgia    GERD  (gastroesophageal reflux disease)    Headache    occasionally   Internal hemorrhoids    Interstitial cystitis 06/06/2012   hx.   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)   Tuberculosis     Past Surgical History:  Procedure Laterality Date   ANTERIOR CERVICAL DECOMP/DISCECTOMY FUSION  2004   C5/6, C6/7   ANTERIOR CERVICAL DECOMP/DISCECTOMY FUSION  02/2016   C3/4, C4/5 with plating Lovell Sheehan)   ANTERIOR CERVICAL DECOMP/DISCECTOMY FUSION  12/29/2021   Procedure: ANTERIOR CERVICAL DECOMPRESSION/ DISCECTOMY FUSION CERVICAL THREE - SEVEN;  Surgeon: Tressie Stalker, MD;  Location: [facility] OR;  Service: Neurosurgery;;   ANTERIOR CERVICAL DECOMPRESSION/DISCECTOMY FUSION 4 LEVELS N/A 12/29/2021   Procedure: CERVICAL FIVE-SIX CORPECTOMY,;  Surgeon: Tressie Stalker, MD;  Location: [facility] OR;  Service: Neurosurgery;  Laterality: N/A;   AUGMENTATION MAMMAPLASTY Bilateral 03/25/2010   BREAST ENHANCEMENT SURGERY  2010   BREAST IMPLANT EXCHANGE  10/2014   exchange saline implants, B mastopexy/capsulorraphy ([facility])   BUNIONECTOMY Bilateral yrs ago   CESAREAN SECTION  1985   x 1   CHOLECYSTECTOMY  06/11/2012   Procedure: LAPAROSCOPIC CHOLECYSTECTOMY WITH INTRAOPERATIVE CHOLANGIOGRAM;  Surgeon: Atilano Ina, MD,FACS;  Location: [facility] ORS;  Service: General;  Laterality: N/A;   COLONOSCOPY  02/2018   done for positive cologuard - WNL, rpt 10 yrs Russella Dar)   CYSTOSCOPY     ERCP  05/22/2012   Procedure: ENDOSCOPIC RETROGRADE CHOLANGIOPANCREATOGRAPHY  (ERCP);  Surgeon: Meryl Dare, MD,FACG;  Location: [facility];  Service: Endoscopy;  Laterality: N/A;   ERCP N/A 09/17/2013   Procedure: ENDOSCOPIC RETROGRADE CHOLANGIOPANCREATOGRAPHY (ERCP);  Surgeon: Meryl Dare, MD;  Location: [facility];  Service: Endoscopy;  Laterality: N/A;   ERCP N/A 09/27/2018   Procedure: ENDOSCOPIC RETROGRADE CHOLANGIOPANCREATOGRAPHY (ERCP);  Surgeon: Meryl Dare, MD;  Location: [facility];  Service: Endoscopy;  Laterality: N/A;   ESOPHAGOGASTRODUODENOSCOPY  09/2016   WNL. esophagus dilated Russella Dar)   HEMORRHOID BANDING  09/23/2013   --Dr. Gaynelle Adu   HERNIA REPAIR     inguinal   HYSTEROSCOPY W/ ENDOMETRIAL ABLATION     KNEE ARTHROSCOPY Right 04/06/2021   Procedure: RIGHT KNEE ARTHROSCOPY WITH DEBRIDEMENT;  Surgeon: Cammy Copa, MD;  Location: [facility];  Service: Orthopedics;  Laterality: Right;   NASAL SINUS SURGERY     x5   PARTIAL KNEE ARTHROPLASTY Right 06/01/2020   Procedure: Right knee patellofemoral replacement;  Surgeon: Cammy Copa, MD;  Location: [facility];  Service: Orthopedics;  Laterality: Right;   POSTERIOR CERVICAL FUSION/FORAMINOTOMY N/A 12/29/2021   Procedure: POSTERIOR CERVICAL FUSION CERVICAL THREE-SEVEN;  Surgeon: Tressie Stalker, MD;  Location: [facility] OR;  Service: Neurosurgery;  Laterality: N/A;   REMOVAL OF STONES  09/27/2018   Procedure: REMOVAL OF STONES;  Surgeon: Meryl Dare, MD;  Location: [facility];  Service: Endoscopy;;   SPHINCTEROTOMY  09/27/2018   Procedure: SPHINCTEROTOMY;  Surgeon: Meryl Dare, MD;  Location: [facility];  Service: Endoscopy;;   TONSILLECTOMY     removed as a child   TOTAL HIP ARTHROPLASTY Left 03/12/2019   Procedure: LEFT TOTAL HIP ARTHROPLASTY ANTERIOR APPROACH;  Magnus Ivan, Vanita Panda, MD)   UPPER GASTROINTESTINAL ENDOSCOPY      Current Outpatient Medications  Medication Sig Dispense Refill   ALPRAZolam (XANAX) 1 MG tablet  Take 0.5 tablets (0.5 mg total) by mouth at bedtime. 30 tablet 0   amLODipine (NORVASC) 2.5 MG tablet TAKE 1 TABLET BY MOUTH DAILY 90 tablet 0   amLODipine (NORVASC) 5 MG tablet TAKE 1 TABLET BY MOUTH DAILY 90 tablet 0   amphetamine-dextroamphetamine (ADDERALL XR) 30 MG 24 hr capsule TAKE 1 CAPSULE BY MOUTH EVERY MORNING 90 capsule 0   ARIPiprazole (ABILIFY) 5 MG tablet Take 5 mg by mouth at bedtime.     Calcium Carbonate-Vitamin D (CALTRATE 600+D PO) Take 2 tablets by mouth daily.     clindamycin (CLEOCIN) 150 MG capsule Take 600mg  1 hour prior to dental procedure 10 capsule 0   cyanocobalamin (VITAMIN B12) 1000 MCG/ML injection INJECT INTO THE MUSCLE EVERY 21 DAYSAS DIRECTED 3 mL 3   Eszopiclone 3 MG TABS Take 1 tablet (3 mg total) by mouth at bedtime. Take immediately before bedtime 90 tablet 0   ferrous sulfate 325 (65 FE)  MG tablet Take 1 tablet (325 mg total) by mouth every Monday, Wednesday, and Friday. 30 tablet 3   lamoTRIgine (LAMICTAL) 200 MG tablet Take 1 tablet (200 mg total) by mouth daily. 30 tablet 0   lidocaine (LIDODERM) 5 % Place 3 patches onto the skin daily. Remove & Discard patch within 12 hours or as directed by MD 60 patch 0   lidocaine (XYLOCAINE) 5 % ointment Apply 1 Application topically as needed. Apply up to 4x/day- on LUE for nerve pain 50 g 6   linaclotide (LINZESS) 290 MCG CAPS capsule Take 1 capsule (290 mcg total) by mouth daily. 90 capsule 2   morphine (MSIR) 15 MG tablet TAKE ONE TABLET BY MOUTH EVERY 6 HOURS AS NEEDED FOR SEVERE PAIN 90 tablet 0   MOVANTIK 25 MG TABS tablet TAKE ONE TABLET BY MOUTH EVERY DAY 90 tablet 0   NONFORMULARY OR COMPOUNDED ITEM Testosterone proprionate petrolatum (jar) 2% ointment  2 Grams S: Apply a small amount once daily x 5 days per week. 2 each 0   ondansetron (ZOFRAN) 4 MG tablet TAKE 1 TABLET BY MOUTH EVERY 8 HOURS AS NEEDED FOR NAUSEA AND VOMITING 30 tablet 0   pantoprazole (PROTONIX) 40 MG tablet TAKE ONE TABLET TWICE A  DAY WITH MEALS 180 tablet 3   polyethylene glycol (MIRALAX / GLYCOLAX) 17 g packet Take 17 g by mouth 2 (two) times daily.     Prasterone (INTRAROSA) 6.5 MG INST UNWRAP AND INSERT 1 SUPPOSITORY  VAGINALLY WITH PROVIDED  APPLICATOR AT BEDTIME 90 each 0   pregabalin (LYRICA) 150 MG capsule Take 150 mg by mouth 2 (two) times daily.     tiZANidine (ZANAFLEX) 2 MG tablet TAKE 1 TABLET BY MOUTH EVERY 8 HOURS AS NEEDED 30 tablet 0   ursodiol (ACTIGALL) 300 MG capsule TAKE 1 CAPSULE BY MOUTH TWICE DAILY 180 capsule 0   No current facility-administered medications for this visit.    Family History  Problem Relation Age of Onset   CAD Father 38       MI, nonsmoker   Esophageal cancer Father 55   Stomach cancer Father    Scleroderma Mother    Hypertension Mother    Esophageal cancer Paternal Grandfather    Stomach cancer Paternal Grandfather    Diabetes Maternal Grandmother    Arthritis Brother    Stroke Neg Hx    Colon cancer Neg Hx    Rectal cancer Neg Hx     Review of Systems  All other systems reviewed and are negative.   Exam:   BP 126/82 (BP Location: Left Arm, Patient Position: Sitting, Cuff Size: Normal)   Ht 5' 3.25" (1.607 m)   Wt 116 lb (52.6 kg)   LMP 07/25/1998   BMI 20.39 kg/m     General appearance: alert, cooperative and appears stated age Head: normocephalic, without obvious abnormality, atraumatic Neck: no adenopathy, supple, symmetrical, trachea midline and thyroid normal to inspection and palpation Lungs: left lower lobe with rhonchi, otherwise clear to auscultation bilaterally Breasts: bilateral implants, no masses or tenderness, No nipple retraction or dimpling, No nipple discharge or bleeding, No axillary adenopathy Heart: regular rate and rhythm Abdomen: soft, non-tender; no masses, no organomegaly Extremities: extremities normal, atraumatic, no cyanosis or edema Skin: skin color, texture, turgor normal. No rashes or lesions Lymph nodes: cervical,  supraclavicular, and axillary nodes normal. Neurologic: grossly normal  Pelvic: External genitalia:  no lesions  No abnormal inguinal nodes palpated.              Urethra:  normal appearing urethra with no masses, tenderness or lesions              Bartholins and Skenes: normal                 Vagina: normal appearing vagina with normal color and discharge, no lesions              Cervix: no lesions              Pap taken: no Bimanual Exam:  Uterus:  normal size, contour, position, consistency, mobility, non-tender              Adnexa: no mass, fullness, tenderness              Rectal exam: yes.  Confirms.              Anus:  normal sphincter tone, no lesions  Chaperone was present for exam:  Warren Lacy, CMA  Assessment:   Encounter for breast and pelvic exam.  Menopause.  Bilateral breast augmentation. Vaginal atrophy.  Decreased libido.  Osteopenia.  Medication monitoring.  Stargardt's disease.  Progressive loss of vision.  Scleroderma.  Bereavement.   Plan: Mammogram screening discussed. Self breast awareness reviewed. Pap and HR HPV 2027. Guidelines for Calcium, Vitamin D, regular exercise program including cardiovascular and weight bearing exercise. Refill of Intrarosa and Testosterone ointment 2%, apply 5 days per week.  BMD in 2025.   Support given for the losses she has encountered and for the life transition.  Trauma counseling recommended.  Follow up annually and prn.   40 min  total time was spent for this patient encounter, including preparation, face-to-face counseling with the patient, coordination of care, and documentation of the encounter in addition to doing the breast and pelvic exam.

## 2023-04-28 ENCOUNTER — Ambulatory Visit: Payer: Medicare Other | Admitting: Physical Therapy

## 2023-05-02 ENCOUNTER — Other Ambulatory Visit: Payer: Self-pay | Admitting: Gastroenterology

## 2023-05-02 ENCOUNTER — Other Ambulatory Visit: Payer: Self-pay | Admitting: Physician Assistant

## 2023-05-02 NOTE — Telephone Encounter (Signed)
Last Fill: 01/31/2023   Next Visit: 07/27/2023   Last Visit: 02/06/2023   Dx: [name] disease without gangrene    Current Dose per office note on 02/06/2023: amlodipine 7.5 mg daily    Okay to refill Amlodipine?

## 2023-05-08 ENCOUNTER — Encounter: Payer: Self-pay | Admitting: Obstetrics and Gynecology

## 2023-05-08 ENCOUNTER — Ambulatory Visit (INDEPENDENT_AMBULATORY_CARE_PROVIDER_SITE_OTHER): Payer: Medicare Other | Admitting: Obstetrics and Gynecology

## 2023-05-08 VITALS — BP 126/82 | Ht 63.25 in | Wt 116.0 lb

## 2023-05-08 DIAGNOSIS — Z634 Disappearance and death of family member: Secondary | ICD-10-CM

## 2023-05-08 DIAGNOSIS — N952 Postmenopausal atrophic vaginitis: Secondary | ICD-10-CM | POA: Diagnosis not present

## 2023-05-08 DIAGNOSIS — R6882 Decreased libido: Secondary | ICD-10-CM

## 2023-05-08 DIAGNOSIS — M8580 Other specified disorders of bone density and structure, unspecified site: Secondary | ICD-10-CM | POA: Diagnosis not present

## 2023-05-08 DIAGNOSIS — Z01419 Encounter for gynecological examination (general) (routine) without abnormal findings: Secondary | ICD-10-CM

## 2023-05-08 DIAGNOSIS — Z5181 Encounter for therapeutic drug level monitoring: Secondary | ICD-10-CM

## 2023-05-08 MED ORDER — INTRAROSA 6.5 MG VA INST: VAGINAL_INSERT | VAGINAL | 3 refills | Status: DC

## 2023-05-08 MED ORDER — NONFORMULARY OR COMPOUNDED ITEM: 3 refills | Status: DC

## 2023-05-08 NOTE — Patient Instructions (Signed)

## 2023-05-15 ENCOUNTER — Encounter: Payer: Self-pay | Admitting: Physical Therapy

## 2023-05-15 ENCOUNTER — Ambulatory Visit: Payer: Medicare Other | Attending: Student | Admitting: Physical Therapy

## 2023-05-15 ENCOUNTER — Other Ambulatory Visit: Payer: Self-pay

## 2023-05-15 DIAGNOSIS — R2689 Other abnormalities of gait and mobility: Secondary | ICD-10-CM | POA: Insufficient documentation

## 2023-05-15 DIAGNOSIS — M25612 Stiffness of left shoulder, not elsewhere classified: Secondary | ICD-10-CM | POA: Diagnosis present

## 2023-05-15 DIAGNOSIS — M6281 Muscle weakness (generalized): Secondary | ICD-10-CM | POA: Diagnosis present

## 2023-05-15 DIAGNOSIS — R2681 Unsteadiness on feet: Secondary | ICD-10-CM | POA: Insufficient documentation

## 2023-05-15 DIAGNOSIS — R278 Other lack of coordination: Secondary | ICD-10-CM | POA: Insufficient documentation

## 2023-05-15 DIAGNOSIS — M79602 Pain in left arm: Secondary | ICD-10-CM | POA: Insufficient documentation

## 2023-05-15 DIAGNOSIS — R208 Other disturbances of skin sensation: Secondary | ICD-10-CM | POA: Insufficient documentation

## 2023-05-15 NOTE — Therapy (Signed)
OUTPATIENT PHYSICAL THERAPY CERVICAL EVALUATION   Patient Name: Rachel Duran MRN: [id number] DOB:19-Oct-1955, 67 y.o., female Today's Date: 05/15/2023  END OF SESSION:  05/15/23 0951  PT Visits / Re-Eval  Visit Number 1  Number of Visits 9 (8 + eval)  Date for PT Re-Evaluation 07/21/23 (pushed out due to potential scheduling conflicts)  Authorization  Authorization Type [employer] MEDICARE  Progress Note Due on Visit 10  PT Time Calculation  PT Start Time 0935  PT Stop Time 1024  PT Time Calculation (min) 49 min  PT - End of Session  Equipment Utilized During Treatment Gait belt  Behavior During Therapy WFL for tasks assessed/performed    Past Medical History:  Diagnosis Date   Abdominal pain last 4 months   and nausea also   Allergy    Anemia    history of   Anxiety    Bipolar disorder (HCC)    atpical bipolar disorder   Cervical disc disease limited rom turning to left   hx. C6- C7 -hx. past fusion(bone graft used)   Cholecystitis    Chronic pain    DDD (degenerative disc disease), lumbar 09/2015   dextroscoliosis with multilevel DDD and facet arthrosis most notable for R foraminal disc protrusion L4/5 producing severe R neural foraminal stenosis abutting R L4 nerve root, moderate spinal canal and mild lat recesss and R neural foraminal stenosis L3/4 (MRI)   Depression    bipolar depression   Disorders of porphyrin metabolism    Felon of finger of left hand 11/10/2016   Fibromyalgia    GERD (gastroesophageal reflux disease)    Headache    occasionally   Internal hemorrhoids    Interstitial cystitis 06/06/2012   hx.   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)   Tuberculosis    Past Surgical History:  Procedure Laterality Date   ANTERIOR CERVICAL DECOMP/DISCECTOMY FUSION  2004   C5/6, C6/7   ANTERIOR CERVICAL DECOMP/DISCECTOMY FUSION  02/2016   C3/4, C4/5 with plating Lovell Sheehan)   ANTERIOR CERVICAL DECOMP/DISCECTOMY FUSION  12/29/2021   Procedure: ANTERIOR CERVICAL DECOMPRESSION/ DISCECTOMY FUSION CERVICAL THREE - SEVEN;  Surgeon: Tressie Stalker, MD;  Location: [facility] OR;  Service: Neurosurgery;;   ANTERIOR CERVICAL DECOMPRESSION/DISCECTOMY FUSION 4 LEVELS N/A 12/29/2021   Procedure: CERVICAL FIVE-SIX CORPECTOMY,;  Surgeon: Tressie Stalker, MD;  Location: [facility] OR;  Service: Neurosurgery;  Laterality: N/A;   AUGMENTATION MAMMAPLASTY Bilateral 03/25/2010   BREAST ENHANCEMENT SURGERY  2010   BREAST IMPLANT EXCHANGE  10/2014   exchange saline implants, B mastopexy/capsulorraphy ([facility])   BUNIONECTOMY Bilateral yrs ago   CESAREAN SECTION  1985   x 1   CHOLECYSTECTOMY  06/11/2012   Procedure: LAPAROSCOPIC CHOLECYSTECTOMY WITH INTRAOPERATIVE CHOLANGIOGRAM;  Surgeon: Atilano Ina, MD,FACS;  Location: [facility] ORS;  Service: General;  Laterality: N/A;   COLONOSCOPY  02/2018   done for positive cologuard - WNL, rpt 10 yrs Russella Dar)   CYSTOSCOPY     ERCP  05/22/2012   Procedure: ENDOSCOPIC RETROGRADE CHOLANGIOPANCREATOGRAPHY (ERCP);  Surgeon: Meryl Dare, MD,FACG;  Location: [facility];  Service: Endoscopy;  Laterality: N/A;   ERCP N/A 09/17/2013   Procedure: ENDOSCOPIC RETROGRADE CHOLANGIOPANCREATOGRAPHY (ERCP);  Surgeon: Meryl Dare, MD;  Location: [facility];  Service: Endoscopy;  Laterality: N/A;   ERCP N/A 09/27/2018   Procedure: ENDOSCOPIC RETROGRADE CHOLANGIOPANCREATOGRAPHY (ERCP);  Surgeon: Meryl Dare, MD;  Location: [facility];  Service: Endoscopy;  Laterality: N/A;    ESOPHAGOGASTRODUODENOSCOPY  09/2016   WNL. esophagus dilated Russella Dar)   HEMORRHOID BANDING  09/23/2013   --Dr. Gaynelle Adu   HERNIA REPAIR     inguinal   HYSTEROSCOPY W/ ENDOMETRIAL ABLATION     KNEE ARTHROSCOPY Right 04/06/2021   Procedure: RIGHT KNEE ARTHROSCOPY WITH DEBRIDEMENT;  Surgeon: Cammy Copa, MD;  Location: [facility];  Service: Orthopedics;  Laterality: Right;   NASAL SINUS SURGERY     x5   PARTIAL KNEE ARTHROPLASTY Right 06/01/2020   Procedure: Right knee patellofemoral replacement;  Surgeon: Cammy Copa, MD;  Location: [facility];  Service: Orthopedics;  Laterality: Right;   POSTERIOR CERVICAL FUSION/FORAMINOTOMY N/A 12/29/2021   Procedure: POSTERIOR CERVICAL FUSION CERVICAL THREE-SEVEN;  Surgeon: Tressie Stalker, MD;  Location: [facility] OR;  Service: Neurosurgery;  Laterality: N/A;   REMOVAL OF STONES  09/27/2018   Procedure: REMOVAL OF STONES;  Surgeon: Meryl Dare, MD;  Location: [facility];  Service: Endoscopy;;   SPHINCTEROTOMY  09/27/2018   Procedure: SPHINCTEROTOMY;  Surgeon: Meryl Dare, MD;  Location: [facility];  Service: Endoscopy;;   TONSILLECTOMY     removed as a child   TOTAL HIP ARTHROPLASTY Left 03/12/2019   Procedure: LEFT TOTAL HIP ARTHROPLASTY ANTERIOR APPROACH;  Magnus Ivan, Vanita Panda, MD)   UPPER GASTROINTESTINAL ENDOSCOPY     Patient Active Problem List   Diagnosis Date Noted   Onycholysis 04/10/2023   Grieving 06/27/2022   Elevated blood pressure reading in office without diagnosis of hypertension 05/31/2022   Closed nondisplaced fracture of lateral end of left clavicle 05/30/2022   Fusion of spine, cervical region    Cervical spine arthritis with nerve pain 12/29/2021   C6 cervical fracture (HCC) 12/29/2021   C5 cervical fracture (HCC) 12/21/2021   Advanced directives, counseling/discussion 09/23/2021   Swelling of right index finger 07/14/2021   Synovitis of right knee    Scoliosis  01/13/2021   Osteopenia 07/02/2020   Status post left knee surgery 06/01/2020   Pulmonary nodule 05/27/2020   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   Chronic patellofemoral pain of right knee 05/05/2020   Positive ANA (antinuclear antibody) 06/05/2019   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   Unilateral primary osteoarthritis, left hip 01/28/2019   Common bile duct (CBD) obstruction    Venous insufficiency of left lower extremity 07/04/2018   Dyslipidemia 09/22/2017   DDD (degenerative disc disease), cervical 04/03/2017   DDD (degenerative disc disease), lumbar 04/03/2017   Onychomycosis 03/21/2017   Dysphagia 10/18/2016   Encounter for chronic pain management 10/18/2016   Biliary stasis 09/26/2016   Dry mouth 06/06/2016   HNP (herniated nucleus pulposus), lumbar 09/23/2015   Iron deficiency 07/30/2015   Health maintenance examination 06/15/2015   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   Clavicle enlargement 12/13/2014   Prediabetes 12/13/2014   Plantar fasciitis, bilateral 09/11/2014   Medicare annual wellness visit, subsequent 06/10/2014   Abnormal thyroid function test 06/10/2014   Chronic pain syndrome 06/10/2014   CFS (chronic fatigue syndrome) 04/08/2014  Postmenopausal atrophic vaginitis 10/19/2012   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/2012   Cervical disc disorder with radiculopathy of cervical region 05/28/2010   APHTHOUS ULCERS 01/31/2008   Insomnia 11/09/2007   Constipation 10/11/2007   Allergic rhinitis 04/12/2007   MDD (major depressive disorder), recurrent episode, moderate (HCC) 03/05/2007   [name] syndrome 03/05/2007   GERD 03/05/2007   ROSACEA 03/05/2007   NEURALGIA 03/05/2007   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   Chronic interstitial cystitis 12/06/2006   Fibromyalgia 12/06/2006    PCP: Eustaquio Boyden, MD  REFERRING PROVIDER: Floreen Comber, NP  REFERRING DIAG: S12.400G (ICD-10-CM) - Unspecified displaced  fracture of fifth cervical vertebra, subsequent encounter for fracture with delayed healing  THERAPY DIAG:  Other lack of coordination  Muscle weakness (generalized)  Unsteadiness on feet  Other abnormalities of gait and mobility  Pain in left arm  Stiffness of left shoulder, not elsewhere classified  Rationale for Evaluation and Treatment: Rehabilitation  ONSET DATE: 04/05/2023 (referral date)  SUBJECTIVE:                                                                                                                                                                                                         SUBJECTIVE STATEMENT: Patient returns to clinic following clavicular fracture on 05/27/2022 following a fall where an unleashed dog ran up to her and her husband while walking their dog.  This caused her to land on her shoulder and head.  She states she has some intermittent neck and shoulder stiffness and pain.  She denies frequent headaches and dizziness post-incident.  She still becomes nauseated with neck movements (especially nodding), but is unsure why, states this has been ongoing for some time.  She is returning to PT to finally address this as this past year has been tough for her emotionally.  She does open up to PT regarding passing of her daughter who was the victim of DV.  She reports that her and her husband are now responsible for 2 children (7 and 27 w/ autism) in their home and a 65 year old who does not live with them. Hand dominance: Right  PERTINENT HISTORY:  Anxiety, Bipolar Disorder, depression, fibromyalgia, IBS, [name] Disease, Scleroderma, Stargardt's disease, Tuberculosis, HTN, left THA 2020, right partial knee arthroplasty, ACDF C3-C7 12/29/2021 (prior procedures 02/2016 and 2004), left clavicle frature 05/27/2022  PAIN:  Are you having pain? Yes: NPRS scale: 5/10 Pain location: upper cervical spine and proximal upper traps and right knee; left thumb and  forefinger Pain description: stiffness, achy, throbbing, nerve pain  in left hand Aggravating factors: general movement; laying flat Relieving factors: heating pad  PRECAUTIONS: Fall  RED FLAGS: None     WEIGHT BEARING RESTRICTIONS: No  FALLS:  Has patient fallen in last 6 months? No  LIVING ENVIRONMENT: Lives with: lives with their spouse Lives in: House/apartment Stairs: Yes: Internal: 14 steps; on left going up; garage entrance has 1 step up, but front is level entry and she uses both. Has following equipment at home: Single point cane, Walker - 2 wheeled, shower chair, Shower bench, and Grab bars  OCCUPATION: Retired  PLOF: Independent - pt has not walked dog since her fall in November and she would like to get back to this.  PATIENT GOALS: "To get back to walking my dog"  NEXT MD VISIT: 06/05/2023 Devona Konig (Urology)  OBJECTIVE:  Note: Objective measures were completed at Evaluation unless otherwise noted.  DIAGNOSTIC FINDINGS:  Left Clavicle x-ray 08/03/2022 AP and oblique views of the left clavicle reviewed.  Callus formation is  noted.  Still some lucency around the fracture ends but no evidence of  definite nonunion.  Shoulder is located.  Overall positive interval change  compared to prior radiographs  PATIENT SURVEYS:  ABC scale To be assessed. NDI 25/50 =moderate perceived disability  COGNITION: Overall cognitive status: Within functional limits for tasks assessed  SENSATION: WFL - some right N/T localized to kneecap  POSTURE: forward head - mild  PALPATION: Mid-cervical paraspinal tenderness and palpable dropoff from cervical flattening around C6/C7.  Thoracic tightness with cervical movement and palpation.   CERVICAL ROM:   Active ROM A/PROM (deg) eval  Flexion   Extension   Right lateral flexion   Left lateral flexion   Right rotation 10  Left rotation 23   (Blank rows = not tested)  UPPER EXTREMITY ROM:  Active ROM  Right eval Left eval  Shoulder flexion [facility]  Shoulder extension    Shoulder abduction [facility]  Shoulder adduction    Shoulder extension    Shoulder internal rotation    Shoulder external rotation    Elbow flexion    Elbow extension    Wrist flexion    Wrist extension    Wrist ulnar deviation    Wrist radial deviation    Wrist pronation    Wrist supination     (Blank rows = not tested)  UPPER EXTREMITY MMT:  MMT Right eval Left eval  Shoulder flexion    Shoulder extension    Shoulder abduction    Shoulder adduction    Shoulder extension    Shoulder internal rotation    Shoulder external rotation    Middle trapezius    Lower trapezius    Elbow flexion    Elbow extension    Wrist flexion    Wrist extension    Wrist ulnar deviation    Wrist radial deviation    Wrist pronation    Wrist supination    Grip strength     (Blank rows = not tested)  CERVICAL SPECIAL TESTS:  Pt unable to tolerate due to pain.  FUNCTIONAL TESTS:  5 times sit to stand: 15.79 seconds hands-on knees 6 minute walk test: To be assessed. Functional gait assessment: To be assessed.  TODAY'S TREATMENT:  DATE: N/A - eval only.   PATIENT EDUCATION:  Education details: PT POC, assessments used and to be used, and goals to be set. Person educated: Patient Education method: Explanation Education comprehension: verbalized understanding  HOME EXERCISE PROGRAM: To be established.  ASSESSMENT:  CLINICAL IMPRESSION: Patient is a 67 y.o. female who was seen today for physical therapy evaluation and treatment for chronic cervicalgia following fracture with delayed healing.  Pt has a significant PMH of anxiety, Bipolar Disorder, depression, fibromyalgia, IBS, [name] Disease, Scleroderma, Stargardt's disease, Tuberculosis, HTN, left THA 2020, right partial knee  arthroplasty, ACDF C3-C7 12/29/2021 (prior procedures 02/2016 and 2004), left clavicle frature 05/27/2022.  Identified impairments include cervical pain, right kneecap N/T (baseline), mild forward head posture with palpable dropoff from C6/C7 and cervical flattening.  She has thoracic tightness with cervical movement with reduced cervical rotation, right worse than left.  MMT deferred due to pain and tenderness with cervical palpation.  Evaluation via the following assessment tools: 5xSTS indicates elevated fall risk.  FGA and to be assessed at next session to further determine deficits impacting general function and safety.  She would benefit from skilled PT to address impairments as noted and progress towards long term goals.  OBJECTIVE IMPAIRMENTS: decreased activity tolerance, decreased balance, decreased coordination, decreased endurance, difficulty walking, decreased ROM, decreased strength, increased fascial restrictions, impaired flexibility, impaired sensation, improper body mechanics, postural dysfunction, and pain.   ACTIVITY LIMITATIONS: carrying, lifting, bending, squatting, stairs, transfers, bed mobility, reach over head, locomotion level, and caring for others  PARTICIPATION LIMITATIONS: meal prep, cleaning, laundry, shopping, and community activity  PERSONAL FACTORS: Age, Past/current experiences, Time since onset of injury/illness/exacerbation, and 1-2 comorbidities: recent major life changes (see subjective) and recent left clavicular fracture  are also affecting patient's functional outcome.   REHAB POTENTIAL: Good  CLINICAL DECISION MAKING: Evolving/moderate complexity  EVALUATION COMPLEXITY: Moderate   GOALS: Goals reviewed with patient? Yes  SHORT TERM GOALS: Target date: 06/16/2023  Pt will be independent and compliant with initial stretching, strengthening, and balance HEP in order to maintain functional progress and improve mobility. Baseline: To be  established. Goal status: INITIAL  2.  Pt will decrease 5xSTS to </=13 seconds w/o UE in order to demonstrate decreased risk for falls and improved functional bilateral LE strength and power. Baseline: 15.79 sec hands on knees Goal status: INITIAL  3.  Pt will improve R rotation to at least 20 degrees in order to improve functional mobility and symmetry to left rotation. Baseline: R rotation 10 deg Goal status: INITIAL  4.  Pt will improve NDI score to </=16/50 in order to demonstrate improved pain management and quality of life. Baseline: 25/50 Goal status: INITIAL  5.  FGA to be assessed w/ STG set as appropriate. Baseline: To be assessed. Goal status: INITIAL  LONG TERM GOALS: Target date: 07/14/2023  Pt will be independent and compliant with finalized stretching, strengthening, and balance HEP in order to maintain functional progress and improve mobility. Baseline: To be established. Goal status: INITIAL  2.  Pt will improve NDI score to </=7/50 in order to demonstrate improved pain management and quality of life. Baseline: 25/50 Goal status: INITIAL  3.  ABC Scale score to be assessed w/ goal set as appropriate. Baseline: To be assessed. Goal status: INITIAL  4.  to be assessed w/ LTG set as appropriate. Baseline: To be assessed. Goal status: INITIAL  5.  FGA to be assessed w/ LTG set as appropriate. Baseline: To be assessed. Goal  status: INITIAL  PLAN:  PT FREQUENCY: 1x/week (due to pt schedule)  PT DURATION: 8 weeks  PLANNED INTERVENTIONS: 97164- PT Re-evaluation, 97110-Therapeutic exercises, 97530- Therapeutic activity, 97112- Neuromuscular re-education, 97535- Self Care, 81191- Manual therapy, [phone]- Gait training, Patient/Family education, Balance training, Stair training, Taping, Dry Needling, Scar mobilization, Vestibular training, DME instructions, and Moist heat  PLAN FOR NEXT SESSION: ABC Scale, , and FGA- set goals.  Initiate HEP for  functional strengthening, cervical rotation, scar mobilization, and balance.  Multidirectional perturbations static and dynamic - pt personal goal of returning to walking her dog.   Sadie Haber, PT, DPT 05/15/2023, 10:24 AM

## 2023-05-17 ENCOUNTER — Other Ambulatory Visit: Payer: Self-pay | Admitting: Family Medicine

## 2023-05-17 DIAGNOSIS — G894 Chronic pain syndrome: Secondary | ICD-10-CM

## 2023-05-18 ENCOUNTER — Encounter: Payer: Self-pay | Admitting: Family Medicine

## 2023-05-18 NOTE — Telephone Encounter (Signed)
See 05/17/23 refill note

## 2023-05-18 NOTE — Telephone Encounter (Signed)
Name of Medication: MSIR Name of Pharmacy: [facility] Last Fill or Written Date and Quantity: 04/06/23, #90 Last Office Visit and Type:   04/10/23, 3 mo pain mgmt f/u Next Office Visit and Type:  07/10/23, 3 mo pain mgmt f/u Last Controlled Substance Agreement Date: 04/19/19 Last UDS: 04/19/19

## 2023-05-18 NOTE — Telephone Encounter (Signed)
ERx 

## 2023-05-24 ENCOUNTER — Ambulatory Visit: Payer: Medicare Other | Admitting: Physical Therapy

## 2023-05-24 ENCOUNTER — Encounter: Payer: Self-pay | Admitting: Physical Therapy

## 2023-05-24 DIAGNOSIS — R2689 Other abnormalities of gait and mobility: Secondary | ICD-10-CM

## 2023-05-24 DIAGNOSIS — M25612 Stiffness of left shoulder, not elsewhere classified: Secondary | ICD-10-CM

## 2023-05-24 DIAGNOSIS — R278 Other lack of coordination: Secondary | ICD-10-CM | POA: Diagnosis not present

## 2023-05-24 DIAGNOSIS — M79602 Pain in left arm: Secondary | ICD-10-CM

## 2023-05-24 DIAGNOSIS — M6281 Muscle weakness (generalized): Secondary | ICD-10-CM

## 2023-05-24 DIAGNOSIS — R2681 Unsteadiness on feet: Secondary | ICD-10-CM

## 2023-05-24 DIAGNOSIS — R208 Other disturbances of skin sensation: Secondary | ICD-10-CM

## 2023-05-24 NOTE — Patient Instructions (Signed)
Access Code: [email or web address] URL: [email or web address] Date: 05/24/2023 Prepared by: Camille Bal  Exercises - Scapular retraction with resistance  - 1 x daily - 3-4 x weekly - 1-2 sets - 12 reps - 2 seconds hold - Seated Shoulder Diagonal Pulls with Resistance  - 1 x daily - 3-4 x weekly - 2 sets - 10 reps

## 2023-05-24 NOTE — Therapy (Signed)
OUTPATIENT PHYSICAL THERAPY CERVICAL TREATMENT   Patient Name: Rachel Duran MRN: [id number] DOB:1956-05-11, 67 y.o., female Today's Date: 05/24/2023  END OF SESSION:  PT End of Session - 05/24/23 1107     Visit Number 2    Number of Visits 9   8 + eval   Date for PT Re-Evaluation 07/21/23   pushed out due to potential scheduling conflicts   Authorization Type [employer] MEDICARE    Progress Note Due on Visit 10    PT Start Time 1103    PT Stop Time 1145    PT Time Calculation (min) 42 min    Equipment Utilized During Treatment Gait belt    Activity Tolerance Patient tolerated treatment well    Behavior During Therapy WFL for tasks assessed/performed             Past Medical History:  Diagnosis Date   Abdominal pain last 4 months   and nausea also   Allergy    Anemia    history of   Anxiety    Bipolar disorder (HCC)    atpical bipolar disorder   Cervical disc disease limited rom turning to left   hx. C6- C7 -hx. past fusion(bone graft used)   Cholecystitis    Chronic pain    DDD (degenerative disc disease), lumbar 09/2015   dextroscoliosis with multilevel DDD and facet arthrosis most notable for R foraminal disc protrusion L4/5 producing severe R neural foraminal stenosis abutting R L4 nerve root, moderate spinal canal and mild lat recesss and R neural foraminal stenosis L3/4 (MRI)   Depression    bipolar depression   Disorders of porphyrin metabolism    Felon of finger of left hand 11/10/2016   Fibromyalgia    GERD (gastroesophageal reflux disease)    Headache    occasionally   Internal hemorrhoids    Interstitial cystitis 06/06/2012   hx.   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)   Tuberculosis    Past Surgical History:  Procedure Laterality Date   ANTERIOR CERVICAL DECOMP/DISCECTOMY FUSION  2004   C5/6, C6/7   ANTERIOR CERVICAL DECOMP/DISCECTOMY FUSION  02/2016   C3/4, C4/5 with plating Lovell Sheehan)   ANTERIOR CERVICAL DECOMP/DISCECTOMY FUSION  12/29/2021   Procedure: ANTERIOR CERVICAL DECOMPRESSION/ DISCECTOMY FUSION CERVICAL THREE - SEVEN;  Surgeon: Tressie Stalker, MD;  Location: [facility] OR;  Service: Neurosurgery;;   ANTERIOR CERVICAL DECOMPRESSION/DISCECTOMY FUSION 4 LEVELS N/A 12/29/2021   Procedure: CERVICAL FIVE-SIX CORPECTOMY,;  Surgeon: Tressie Stalker, MD;  Location: [facility] OR;  Service: Neurosurgery;  Laterality: N/A;   AUGMENTATION MAMMAPLASTY Bilateral 03/25/2010   BREAST ENHANCEMENT SURGERY  2010   BREAST IMPLANT EXCHANGE  10/2014   exchange saline implants, B mastopexy/capsulorraphy ([facility])   BUNIONECTOMY Bilateral yrs ago   CESAREAN SECTION  1985   x 1   CHOLECYSTECTOMY  06/11/2012   Procedure: LAPAROSCOPIC CHOLECYSTECTOMY WITH INTRAOPERATIVE CHOLANGIOGRAM;  Surgeon: Atilano Ina, MD,FACS;  Location: [facility] ORS;  Service: General;  Laterality: N/A;   COLONOSCOPY  02/2018   done for positive cologuard - WNL, rpt 10 yrs Russella Dar)   CYSTOSCOPY     ERCP  05/22/2012   Procedure: ENDOSCOPIC RETROGRADE CHOLANGIOPANCREATOGRAPHY (ERCP);  Surgeon: Meryl Dare, MD,FACG;  Location: [facility];  Service: Endoscopy;  Laterality: N/A;   ERCP N/A 09/17/2013   Procedure: ENDOSCOPIC RETROGRADE CHOLANGIOPANCREATOGRAPHY (ERCP);  Surgeon: Meryl Dare, MD;  Location: [facility];  Service: Endoscopy;  Laterality: N/A;   ERCP N/A 09/27/2018   Procedure: ENDOSCOPIC RETROGRADE CHOLANGIOPANCREATOGRAPHY (ERCP);  Surgeon: Meryl Dare, MD;  Location: [facility];  Service:  Endoscopy;  Laterality: N/A;   ESOPHAGOGASTRODUODENOSCOPY  09/2016   WNL. esophagus dilated Russella Dar)   HEMORRHOID BANDING  09/23/2013   --Dr. Gaynelle Adu   HERNIA REPAIR     inguinal   HYSTEROSCOPY W/ ENDOMETRIAL ABLATION     KNEE ARTHROSCOPY Right 04/06/2021   Procedure: RIGHT KNEE ARTHROSCOPY WITH DEBRIDEMENT;  Surgeon: Cammy Copa, MD;  Location: [facility];  Service: Orthopedics;  Laterality: Right;   NASAL SINUS SURGERY     x5   PARTIAL KNEE ARTHROPLASTY Right 06/01/2020   Procedure: Right knee patellofemoral replacement;  Surgeon: Cammy Copa, MD;  Location: [facility];  Service: Orthopedics;  Laterality: Right;   POSTERIOR CERVICAL FUSION/FORAMINOTOMY N/A 12/29/2021   Procedure: POSTERIOR CERVICAL FUSION CERVICAL THREE-SEVEN;  Surgeon: Tressie Stalker, MD;  Location: [facility] OR;  Service: Neurosurgery;  Laterality: N/A;   REMOVAL OF STONES  09/27/2018   Procedure: REMOVAL OF STONES;  Surgeon: Meryl Dare, MD;  Location: [facility];  Service: Endoscopy;;   SPHINCTEROTOMY  09/27/2018   Procedure: SPHINCTEROTOMY;  Surgeon: Meryl Dare, MD;  Location: [facility];  Service: Endoscopy;;   TONSILLECTOMY     removed as a child   TOTAL HIP ARTHROPLASTY Left 03/12/2019   Procedure: LEFT TOTAL HIP ARTHROPLASTY ANTERIOR APPROACH;  Magnus Ivan, Vanita Panda, MD)   UPPER GASTROINTESTINAL ENDOSCOPY     Patient Active Problem List   Diagnosis Date Noted   Onycholysis 04/10/2023   Grieving 06/27/2022   Elevated blood pressure reading in office without diagnosis of hypertension 05/31/2022   Closed nondisplaced fracture of lateral end of left clavicle 05/30/2022   Fusion of spine, cervical region    Cervical spine arthritis with nerve pain 12/29/2021   C6 cervical fracture (HCC) 12/29/2021   C5 cervical fracture (HCC) 12/21/2021   Advanced directives, counseling/discussion 09/23/2021   Swelling of right index finger 07/14/2021   Synovitis  of right knee    Scoliosis 01/13/2021   Osteopenia 07/02/2020   Status post left knee surgery 06/01/2020   Pulmonary nodule 05/27/2020   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   Chronic patellofemoral pain of right knee 05/05/2020   Positive ANA (antinuclear antibody) 06/05/2019   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   Unilateral primary osteoarthritis, left hip 01/28/2019   Common bile duct (CBD) obstruction    Venous insufficiency of left lower extremity 07/04/2018   Dyslipidemia 09/22/2017   DDD (degenerative disc disease), cervical 04/03/2017   DDD (degenerative disc disease), lumbar 04/03/2017   Onychomycosis 03/21/2017   Dysphagia 10/18/2016   Encounter for chronic pain management 10/18/2016   Biliary stasis 09/26/2016   Dry mouth 06/06/2016   HNP (herniated nucleus pulposus), lumbar 09/23/2015   Iron deficiency 07/30/2015   Health maintenance examination 06/15/2015   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   Clavicle enlargement 12/13/2014   Prediabetes 12/13/2014   Plantar fasciitis, bilateral 09/11/2014  Medicare annual wellness visit, subsequent 06/10/2014   Abnormal thyroid function test 06/10/2014   Chronic pain syndrome 06/10/2014   CFS (chronic fatigue syndrome) 04/08/2014   Postmenopausal atrophic vaginitis 10/19/2012   Positive QuantiFERON-TB Gold test 02/07/2012   Cervical disc disorder with radiculopathy of cervical region 05/28/2010   APHTHOUS ULCERS 01/31/2008   Insomnia 11/09/2007   Constipation 10/11/2007   Allergic rhinitis 04/12/2007   MDD (major depressive disorder), recurrent episode, moderate (HCC) 03/05/2007   [name] syndrome 03/05/2007   GERD 03/05/2007   ROSACEA 03/05/2007   NEURALGIA 03/05/2007   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   Chronic interstitial cystitis 12/06/2006   Fibromyalgia 12/06/2006    PCP: Eustaquio Boyden, MD  REFERRING PROVIDER: Floreen Comber, NP  REFERRING DIAG: S12.400G  (ICD-10-CM) - Unspecified displaced fracture of fifth cervical vertebra, subsequent encounter for fracture with delayed healing  THERAPY DIAG:  Other lack of coordination  Muscle weakness (generalized)  Unsteadiness on feet  Other abnormalities of gait and mobility  Pain in left arm  Stiffness of left shoulder, not elsewhere classified  Other disturbances of skin sensation  Rationale for Evaluation and Treatment: Rehabilitation  ONSET DATE: 04/05/2023 (referral date)  SUBJECTIVE:                                                                                                                                                                                                         SUBJECTIVE STATEMENT: Patient reports weather is impacting stiffness in neck and arms.  She feels her shoulders are very weak.  She denies recent falls or acute changes since eval.  Hand dominance: Right  PERTINENT HISTORY:  Anxiety, Bipolar Disorder, depression, fibromyalgia, IBS, [name] Disease, Scleroderma, Stargardt's disease, Tuberculosis, HTN, left THA 2020, right partial knee arthroplasty, ACDF C3-C7 12/29/2021 (prior procedures 02/2016 and 2004), left clavicle frature 05/27/2022  PAIN:  Are you having pain? Yes: NPRS scale: 4/10 Pain location: upper cervical spine and proximal upper traps and right knee; left thumb and forefinger Pain description: stiffness, achy, throbbing, nerve pain in left hand Aggravating factors: general movement; laying flat Relieving factors: heating pad  PRECAUTIONS: Fall  RED FLAGS: None     WEIGHT BEARING RESTRICTIONS: No  FALLS:  Has patient fallen in last 6 months? No  LIVING ENVIRONMENT: Lives with: lives with their spouse Lives in: House/apartment Stairs: Yes: Internal: 14 steps; on left going up; garage entrance has 1 step up, but front is level entry and she uses both. Has following equipment at home: Single point cane, Walker - 2 wheeled, shower chair,  Shower bench, and Grab bars  OCCUPATION: Retired  PLOF: Independent - pt has not walked dog since her fall in November and she would like to get back to this.  PATIENT GOALS: "To get back to walking my dog"  NEXT MD VISIT: 06/05/2023 Devona Konig (Urology)  OBJECTIVE:  Note: Objective measures were completed at Evaluation unless otherwise noted.  DIAGNOSTIC FINDINGS:  Left Clavicle x-ray 08/03/2022 AP and oblique views of the left clavicle reviewed.  Callus formation is  noted.  Still some lucency around the fracture ends but no evidence of  definite nonunion.  Shoulder is located.  Overall positive interval change  compared to prior radiographs  PATIENT SURVEYS:  ABC scale To be assessed. NDI 25/50 =moderate perceived disability  COGNITION: Overall cognitive status: Within functional limits for tasks assessed  SENSATION: WFL - some right N/T localized to kneecap  POSTURE: forward head - mild  PALPATION: Mid-cervical paraspinal tenderness and palpable dropoff from cervical flattening around C6/C7.  Thoracic tightness with cervical movement and palpation.   CERVICAL ROM:   Active ROM A/PROM (deg) eval  Flexion   Extension   Right lateral flexion   Left lateral flexion   Right rotation 10  Left rotation 23   (Blank rows = not tested)  UPPER EXTREMITY ROM:  Active ROM Right eval Left eval  Shoulder flexion [facility]  Shoulder extension    Shoulder abduction [facility]  Shoulder adduction    Shoulder extension    Shoulder internal rotation    Shoulder external rotation    Elbow flexion    Elbow extension    Wrist flexion    Wrist extension    Wrist ulnar deviation    Wrist radial deviation    Wrist pronation    Wrist supination     (Blank rows = not tested)  UPPER EXTREMITY MMT:  MMT Right eval Left eval  Shoulder flexion    Shoulder extension    Shoulder abduction    Shoulder adduction    Shoulder extension    Shoulder internal rotation     Shoulder external rotation    Middle trapezius    Lower trapezius    Elbow flexion    Elbow extension    Wrist flexion    Wrist extension    Wrist ulnar deviation    Wrist radial deviation    Wrist pronation    Wrist supination    Grip strength     (Blank rows = not tested)  CERVICAL SPECIAL TESTS:  Pt unable to tolerate due to pain.  FUNCTIONAL TESTS:  5 times sit to stand: 15.79 seconds hands-on knees 6 minute walk test: To be assessed. Functional gait assessment: To be assessed.  TODAY'S TREATMENT:                                                                                                                              DATE: 05/24/2023  -ABC Scale:  58.125% -FGA:    [facility] PT Assessment - 05/24/23 1121       Functional Gait  Assessment   Gait assessed  Yes    Gait Level Surface Walks 20 ft in less than 7 sec but greater than 5.5 sec, uses assistive device, slower speed, mild gait deviations, or deviates 6-10 in outside of the 12 in walkway width.    Change in Gait Speed Able to change speed, demonstrates mild gait deviations, deviates 6-10 in outside of the 12 in walkway width, or no gait deviations, unable to achieve a major change in velocity, or uses a change in velocity, or uses an assistive device.    Gait with Horizontal Head Turns Performs head turns smoothly with slight change in gait velocity (eg, minor disruption to smooth gait path), deviates 6-10 in outside 12 in walkway width, or uses an assistive device.    Gait with Vertical Head Turns Performs head turns with no change in gait. Deviates no more than 6 in outside 12 in walkway width.    Gait and Pivot Turn Pivot turns safely within 3 sec and stops quickly with no loss of balance.    Step Over Obstacle Is able to step over one shoe box (4.5 in total height) without changing gait speed. No evidence of imbalance.    Gait with Narrow Base of Support Ambulates 7-9 steps.    Gait with Eyes Closed Walks 20 ft, slow  speed, abnormal gait pattern, evidence for imbalance, deviates 10-15 in outside 12 in walkway width. Requires more than 9 sec to ambulate 20 ft.    Ambulating Backwards Walks 20 ft, uses assistive device, slower speed, mild gait deviations, deviates 6-10 in outside 12 in walkway width.    Steps Alternating feet, must use rail.    Total Score 21    FGA comment: 21/30 = moderate fall risk            - :  1291 ft IND  Initial HEP:  -Scapular retractions x10 w/ 2 sec hold -Scapular retractions w/ red theraband x12 w/ 2 sec hold  -Seated diagonal pulls w/ red theraband 2x10, cues for ROM and form  PATIENT EDUCATION:  Education details: Outcome interpretations.  Initial HEP. Person educated: Patient Education method: Explanation Education comprehension: verbalized understanding  HOME EXERCISE PROGRAM: Access Code: [email or web address] URL: [email or web address] Date: 05/24/2023 Prepared by: Camille Bal  Exercises - Scapular retraction with resistance  - 1 x daily - 3-4 x weekly - 1-2 sets - 12 reps - 2 seconds hold - Seated Shoulder Diagonal Pulls with Resistance  - 1 x daily - 3-4 x weekly - 2 sets - 10 reps  ASSESSMENT:  CLINICAL IMPRESSION: Assessed ABC Scale, , and FGA for LTG establishment.  Patient demonstrating moderate fall risk and some decreased endurance compared to prior episode.  Established initial HEP to begin addressing postural imbalance contributing to ongoing issues.  Will continue per POC.  OBJECTIVE IMPAIRMENTS: decreased activity tolerance, decreased balance, decreased coordination, decreased endurance, difficulty walking, decreased ROM, decreased strength, increased fascial restrictions, impaired flexibility, impaired sensation, improper body mechanics, postural dysfunction, and pain.   ACTIVITY LIMITATIONS: carrying, lifting, bending, squatting, stairs, transfers, bed mobility, reach over head, locomotion level, and caring for  others  PARTICIPATION LIMITATIONS: meal prep, cleaning, laundry, shopping, and community activity  PERSONAL FACTORS: Age, Past/current experiences, Time since onset of injury/illness/exacerbation, and 1-2 comorbidities: recent major life changes (see subjective) and recent left clavicular fracture  are also affecting patient's functional outcome.  REHAB POTENTIAL: Good  CLINICAL DECISION MAKING: Evolving/moderate complexity  EVALUATION COMPLEXITY: Moderate   GOALS: Goals reviewed with patient? Yes  SHORT TERM GOALS: Target date: 06/16/2023  Pt will be independent and compliant with initial stretching, strengthening, and balance HEP in order to maintain functional progress and improve mobility. Baseline: To be established. Goal status: INITIAL  2.  Pt will decrease 5xSTS to </=13 seconds w/o UE in order to demonstrate decreased risk for falls and improved functional bilateral LE strength and power. Baseline: 15.79 sec hands on knees Goal status: INITIAL  3.  Pt will improve R rotation to at least 20 degrees in order to improve functional mobility and symmetry to left rotation. Baseline: R rotation 10 deg Goal status: INITIAL  4.  Pt will improve NDI score to </=16/50 in order to demonstrate improved pain management and quality of life. Baseline: 25/50 Goal status: INITIAL  5.  Pt will improve FGA score to >/=25/30 in order to demonstrate improved balance and decreased fall risk Baseline: 21/30 (10/30) Goal status: INITIAL  LONG TERM GOALS: Target date: 07/14/2023  Pt will be independent and compliant with finalized stretching, strengthening, and balance HEP in order to maintain functional progress and improve mobility. Baseline: To be established. Goal status: INITIAL  2.  Pt will improve NDI score to </=7/50 in order to demonstrate improved pain management and quality of life. Baseline: 25/50 Goal status: INITIAL  3.  Pt will improve ABC Scale score to >/= 68.125% in  order to demonstrate decreased fear of falling. Baseline: 58.125% (10/30) Goal status: INITIAL  4.  Pt will ambulate >/=1600 feet on to demonstrate improved functional endurance for home and community participation. Baseline: 1291 ft IND (10/30) Goal status: INITIAL  5.  Pt will improve FGA score to >/=29/30 in order to demonstrate improved balance and decreased fall risk Baseline: 21/30 (10/30) Goal status: INITIAL  PLAN:  PT FREQUENCY: 1x/week (due to pt schedule)  PT DURATION: 8 weeks  PLANNED INTERVENTIONS: 97164- PT Re-evaluation, 97110-Therapeutic exercises, 97530- Therapeutic activity, 97112- Neuromuscular re-education, 97535- Self Care, [id number]- Manual therapy, [id number]- Gait training, Patient/Family education, Balance training, Stair training, Taping, Dry Needling, Scar mobilization, Vestibular training, DME instructions, and Moist heat  PLAN FOR NEXT SESSION: Modify HEP prn for functional strengthening, cervical rotation, scar mobilization, and balance.  Multidirectional perturbations static and dynamic - pt personal goal of returning to walking her dog.  Trunk and hip mobility.  Left pec stretches?   Sadie Haber, PT, DPT 05/24/2023, 11:52 AM

## 2023-06-02 ENCOUNTER — Ambulatory Visit: Payer: Medicare Other | Attending: Family Medicine | Admitting: Physical Therapy

## 2023-06-02 ENCOUNTER — Encounter: Payer: Self-pay | Admitting: Physical Therapy

## 2023-06-02 DIAGNOSIS — M6281 Muscle weakness (generalized): Secondary | ICD-10-CM | POA: Insufficient documentation

## 2023-06-02 DIAGNOSIS — M79602 Pain in left arm: Secondary | ICD-10-CM | POA: Diagnosis present

## 2023-06-02 DIAGNOSIS — R208 Other disturbances of skin sensation: Secondary | ICD-10-CM | POA: Insufficient documentation

## 2023-06-02 DIAGNOSIS — R278 Other lack of coordination: Secondary | ICD-10-CM | POA: Diagnosis present

## 2023-06-02 DIAGNOSIS — R2689 Other abnormalities of gait and mobility: Secondary | ICD-10-CM | POA: Diagnosis present

## 2023-06-02 DIAGNOSIS — R2681 Unsteadiness on feet: Secondary | ICD-10-CM | POA: Diagnosis present

## 2023-06-02 DIAGNOSIS — M25612 Stiffness of left shoulder, not elsewhere classified: Secondary | ICD-10-CM | POA: Insufficient documentation

## 2023-06-02 NOTE — Therapy (Signed)
OUTPATIENT PHYSICAL THERAPY CERVICAL TREATMENT   Patient Name: Rachel Duran MRN: [id number] DOB:[birth year], 67 y.o., female Today's Date: 06/02/2023  END OF SESSION:  PT End of Session - 06/02/23 1017     Visit Number 3    Number of Visits 9   8 + eval   Date for PT Re-Evaluation 07/21/23   pushed out due to potential scheduling conflicts   Authorization Type [employer] MEDICARE    Progress Note Due on Visit 10    PT Start Time 1015    PT Stop Time 1105    PT Time Calculation (min) 50 min    Equipment Utilized During Treatment Gait belt    Activity Tolerance Patient tolerated treatment well    Behavior During Therapy WFL for tasks assessed/performed             Past Medical History:  Diagnosis Date   Abdominal pain last 4 months   and nausea also   Allergy    Anemia    history of   Anxiety    Bipolar disorder (HCC)    atpical bipolar disorder   Cervical disc disease limited rom turning to left   hx. C6- C7 -hx. past fusion(bone graft used)   Cholecystitis    Chronic pain    DDD (degenerative disc disease), lumbar 09/2015   dextroscoliosis with multilevel DDD and facet arthrosis most notable for R foraminal disc protrusion L4/5 producing severe R neural foraminal stenosis abutting R L4 nerve root, moderate spinal canal and mild lat recesss and R neural foraminal stenosis L3/4 (MRI)   Depression    bipolar depression   Disorders of porphyrin metabolism    Felon of finger of left hand 11/10/2016   Fibromyalgia    GERD (gastroesophageal reflux disease)    Headache    occasionally   Internal hemorrhoids    Interstitial cystitis 06/06/2012   hx.   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)   Tuberculosis    Past Surgical History:  Procedure Laterality Date   ANTERIOR CERVICAL DECOMP/DISCECTOMY FUSION  2004   C5/6, C6/7   ANTERIOR CERVICAL DECOMP/DISCECTOMY FUSION  02/2016   C3/4, C4/5 with plating Lovell Sheehan)   ANTERIOR CERVICAL DECOMP/DISCECTOMY FUSION  12/29/2021   Procedure: ANTERIOR CERVICAL DECOMPRESSION/ DISCECTOMY FUSION CERVICAL THREE - SEVEN;  Surgeon: Tressie Stalker, MD;  Location: [facility] OR;  Service: Neurosurgery;;   ANTERIOR CERVICAL DECOMPRESSION/DISCECTOMY FUSION 4 LEVELS N/A 12/29/2021   Procedure: CERVICAL FIVE-SIX CORPECTOMY,;  Surgeon: Tressie Stalker, MD;  Location: [facility] OR;  Service: Neurosurgery;  Laterality: N/A;   AUGMENTATION MAMMAPLASTY Bilateral 03/25/2010   BREAST ENHANCEMENT SURGERY  2010   BREAST IMPLANT EXCHANGE  10/2014   exchange saline implants, B mastopexy/capsulorraphy ([facility])   BUNIONECTOMY Bilateral yrs ago   CESAREAN SECTION  1985   x 1   CHOLECYSTECTOMY  06/11/2012   Procedure: LAPAROSCOPIC CHOLECYSTECTOMY WITH INTRAOPERATIVE CHOLANGIOGRAM;  Surgeon: Atilano Ina, MD,FACS;  Location: [facility] ORS;  Service: General;  Laterality: N/A;   COLONOSCOPY  02/2018   done for positive cologuard - WNL, rpt 10 yrs Russella Dar)   CYSTOSCOPY     ERCP  05/22/2012   Procedure: ENDOSCOPIC RETROGRADE CHOLANGIOPANCREATOGRAPHY (ERCP);  Surgeon: Meryl Dare, MD,FACG;  Location: [facility];  Service: Endoscopy;  Laterality: N/A;   ERCP N/A 09/17/2013   Procedure: ENDOSCOPIC RETROGRADE CHOLANGIOPANCREATOGRAPHY (ERCP);  Surgeon: Meryl Dare, MD;  Location: [facility];  Service: Endoscopy;  Laterality: N/A;   ERCP N/A 09/27/2018   Procedure: ENDOSCOPIC RETROGRADE CHOLANGIOPANCREATOGRAPHY (ERCP);  Surgeon: Meryl Dare, MD;  Location: [facility];  Service:  Endoscopy;  Laterality: N/A;   ESOPHAGOGASTRODUODENOSCOPY  09/2016   WNL. esophagus dilated Russella Dar)   HEMORRHOID BANDING  09/23/2013   --Dr. Gaynelle Adu   HERNIA REPAIR     inguinal   HYSTEROSCOPY W/ ENDOMETRIAL ABLATION     KNEE ARTHROSCOPY Right 04/06/2021   Procedure: RIGHT KNEE ARTHROSCOPY WITH DEBRIDEMENT;  Surgeon: Cammy Copa, MD;  Location: [facility];  Service: Orthopedics;  Laterality: Right;   NASAL SINUS SURGERY     x5   PARTIAL KNEE ARTHROPLASTY Right 06/01/2020   Procedure: Right knee patellofemoral replacement;  Surgeon: Cammy Copa, MD;  Location: [facility];  Service: Orthopedics;  Laterality: Right;   POSTERIOR CERVICAL FUSION/FORAMINOTOMY N/A 12/29/2021   Procedure: POSTERIOR CERVICAL FUSION CERVICAL THREE-SEVEN;  Surgeon: Tressie Stalker, MD;  Location: [facility] OR;  Service: Neurosurgery;  Laterality: N/A;   REMOVAL OF STONES  09/27/2018   Procedure: REMOVAL OF STONES;  Surgeon: Meryl Dare, MD;  Location: [facility];  Service: Endoscopy;;   SPHINCTEROTOMY  09/27/2018   Procedure: SPHINCTEROTOMY;  Surgeon: Meryl Dare, MD;  Location: [facility];  Service: Endoscopy;;   TONSILLECTOMY     removed as a child   TOTAL HIP ARTHROPLASTY Left 03/12/2019   Procedure: LEFT TOTAL HIP ARTHROPLASTY ANTERIOR APPROACH;  Magnus Ivan, Vanita Panda, MD)   UPPER GASTROINTESTINAL ENDOSCOPY     Patient Active Problem List   Diagnosis Date Noted   Onycholysis 04/10/2023   Grieving 06/27/2022   Elevated blood pressure reading in office without diagnosis of hypertension 05/31/2022   Closed nondisplaced fracture of lateral end of left clavicle 05/30/2022   Fusion of spine, cervical region    Cervical spine arthritis with nerve pain 12/29/2021   C6 cervical fracture (HCC) 12/29/2021   C5 cervical fracture (HCC) 12/21/2021   Advanced directives, counseling/discussion 09/23/2021   Swelling of right index finger 07/14/2021   Synovitis  of right knee    Scoliosis 01/13/2021   Osteopenia 07/02/2020   Status post left knee surgery 06/01/2020   Pulmonary nodule 05/27/2020   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   Chronic patellofemoral pain of right knee 05/05/2020   Positive ANA (antinuclear antibody) 06/05/2019   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   Unilateral primary osteoarthritis, left hip 01/28/2019   Common bile duct (CBD) obstruction    Venous insufficiency of left lower extremity 07/04/2018   Dyslipidemia 09/22/2017   DDD (degenerative disc disease), cervical 04/03/2017   DDD (degenerative disc disease), lumbar 04/03/2017   Onychomycosis 03/21/2017   Dysphagia 10/18/2016   Encounter for chronic pain management 10/18/2016   Biliary stasis 09/26/2016   Dry mouth 06/06/2016   HNP (herniated nucleus pulposus), lumbar 09/23/2015   Iron deficiency 07/30/2015   Health maintenance examination 06/15/2015   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   Clavicle enlargement 12/13/2014   Prediabetes 12/13/2014   Plantar fasciitis, bilateral 09/11/2014  Medicare annual wellness visit, subsequent 06/10/2014   Abnormal thyroid function test 06/10/2014   Chronic pain syndrome 06/10/2014   CFS (chronic fatigue syndrome) 04/08/2014   Postmenopausal atrophic vaginitis 10/19/2012   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   Cervical disc disorder with radiculopathy of cervical region 05/28/2010   APHTHOUS ULCERS 01/31/2008   Insomnia 11/09/2007   Constipation 10/11/2007   Allergic rhinitis 04/12/2007   MDD (major depressive disorder), recurrent episode, moderate (HCC) 03/05/2007   [name] syndrome 03/05/2007   GERD 03/05/2007   ROSACEA 03/05/2007   NEURALGIA 03/05/2007   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   Chronic interstitial cystitis 12/06/2006   Fibromyalgia 12/06/2006    PCP: Eustaquio Boyden, MD  REFERRING PROVIDER: Floreen Comber, NP  REFERRING DIAG: S12.400G  (ICD-10-CM) - Unspecified displaced fracture of fifth cervical vertebra, subsequent encounter for fracture with delayed healing  THERAPY DIAG:  Other lack of coordination  Muscle weakness (generalized)  Unsteadiness on feet  Other abnormalities of gait and mobility  Pain in left arm  Stiffness of left shoulder, not elsewhere classified  Other disturbances of skin sensation  Rationale for Evaluation and Treatment: Rehabilitation  ONSET DATE: 04/05/2023 (referral date)  SUBJECTIVE:                                                                                                                                                                                                         SUBJECTIVE STATEMENT: Patient states she did not rest well last night due to her grandchild sleeping in bed with them.  She states her neck is really bothering her today and her scar feels tight.  She denies falls or acute changes. Hand dominance: Right  PERTINENT HISTORY:  Anxiety, Bipolar Disorder, depression, fibromyalgia, IBS, [name] Disease, Scleroderma, Stargardt's disease, Tuberculosis, HTN, left THA 2020, right partial knee arthroplasty, ACDF C3-C7 12/29/2021 (prior procedures 02/2016 and 2004), left clavicle frature 05/27/2022  PAIN:  Are you having pain? Yes: NPRS scale: 5/10 Pain location: upper cervical spine and proximal upper traps and right knee; left thumb and forefinger Pain description: stiffness, achy, throbbing, nerve pain in left hand, tight Aggravating factors: general movement; laying flat Relieving factors: heating pad  PRECAUTIONS: Fall  RED FLAGS: None     WEIGHT BEARING RESTRICTIONS: No  FALLS:  Has patient fallen in last 6 months? No  LIVING ENVIRONMENT: Lives with: lives with their spouse Lives in: House/apartment Stairs: Yes: Internal: 14 steps; on left going up; garage entrance has 1 step up, but front is level entry and she uses both. Has following equipment  at  home: Single point cane, Walker - 2 wheeled, shower chair, Shower bench, and Grab bars  OCCUPATION: Retired  PLOF: Independent - pt has not walked dog since her fall in November and she would like to get back to this.  PATIENT GOALS: "To get back to walking my dog"  NEXT MD VISIT: 06/05/2023 Devona Konig (Urology)  OBJECTIVE:  Note: Objective measures were completed at Evaluation unless otherwise noted.  DIAGNOSTIC FINDINGS:  Left Clavicle x-ray 08/03/2022 AP and oblique views of the left clavicle reviewed.  Callus formation is  noted.  Still some lucency around the fracture ends but no evidence of  definite nonunion.  Shoulder is located.  Overall positive interval change  compared to prior radiographs  PATIENT SURVEYS:  ABC scale To be assessed. NDI 25/50 =moderate perceived disability  COGNITION: Overall cognitive status: Within functional limits for tasks assessed  SENSATION: WFL - some right N/T localized to kneecap  POSTURE: forward head - mild  PALPATION: Mid-cervical paraspinal tenderness and palpable dropoff from cervical flattening around C6/C7.  Thoracic tightness with cervical movement and palpation.   CERVICAL ROM:   Active ROM A/PROM (deg) eval  Flexion   Extension   Right lateral flexion   Left lateral flexion   Right rotation 10  Left rotation 23   (Blank rows = not tested)  UPPER EXTREMITY ROM:  Active ROM Right eval Left eval  Shoulder flexion [facility]  Shoulder extension    Shoulder abduction [facility]  Shoulder adduction    Shoulder extension    Shoulder internal rotation    Shoulder external rotation    Elbow flexion    Elbow extension    Wrist flexion    Wrist extension    Wrist ulnar deviation    Wrist radial deviation    Wrist pronation    Wrist supination     (Blank rows = not tested)  UPPER EXTREMITY MMT:  MMT Right eval Left eval  Shoulder flexion    Shoulder extension    Shoulder abduction    Shoulder  adduction    Shoulder extension    Shoulder internal rotation    Shoulder external rotation    Middle trapezius    Lower trapezius    Elbow flexion    Elbow extension    Wrist flexion    Wrist extension    Wrist ulnar deviation    Wrist radial deviation    Wrist pronation    Wrist supination    Grip strength     (Blank rows = not tested)  CERVICAL SPECIAL TESTS:  Pt unable to tolerate due to pain.  FUNCTIONAL TESTS:  5 times sit to stand: 15.79 seconds hands-on knees 6 minute walk test: To be assessed. Functional gait assessment: To be assessed.  TODAY'S TREATMENT:  DATE: 06/02/2023  Manual Therapy/TA: -STM/scar mobilization to anterior, lateral, and posterior neck scars to improve pain management and ROM due to superficial tightness.  Left trap trigger point release.  Education on techniques for home and having husband assist.  Recommended moisturizing scars everyday to prevent dryness limiting skin mobility and promoting scar integrity.  Pain science education regarding fear of falling and life stressors and upcycling of pain response.  TherEx: -Hooklying cervical stretches for neck flexion, extension (broken up into 5 second increments for jaw protrusion addition to stretch myofascial tissue and platysma) - mild dizziness with excessive extension so ROM modified, rotation (w/ towel bolster for comfort at side of face) x1 minute each repeated for 2 rounds, had pt practice paced breathing during rest between each stretch to facilitate positive pain modulation.  PATIENT EDUCATION:  Education details: Continue HEP.  See above. Person educated: Patient Education method: Explanation Education comprehension: verbalized understanding  HOME EXERCISE PROGRAM: Access Code: [email or web address] URL: [email or web address] Date: 05/24/2023 Prepared by: Camille Bal  Exercises - Scapular retraction with resistance  - 1 x daily - 3-4 x weekly - 1-2 sets - 12 reps - 2 seconds hold - Seated Shoulder Diagonal Pulls with Resistance  - 1 x daily - 3-4 x weekly - 2 sets - 10 reps  ASSESSMENT:  CLINICAL IMPRESSION: Goal of skilled PT session today on addressing pain response to movement and limitations of AROM via scar and soft tissue mobilization.  Large component of education this visit to help patient return to techniques at home to further manage symptoms.  Also addressed gentle cervical stretching to improve ROM and tolerance.  She continues to benefit from skilled PT to further address these limitations as well as general functional strength and balance to improve quality of life.    OBJECTIVE IMPAIRMENTS: decreased activity tolerance, decreased balance, decreased coordination, decreased endurance, difficulty walking, decreased ROM, decreased strength, increased fascial restrictions, impaired flexibility, impaired sensation, improper body mechanics, postural dysfunction, and pain.   ACTIVITY LIMITATIONS: carrying, lifting, bending, squatting, stairs, transfers, bed mobility, reach over head, locomotion level, and caring for others  PARTICIPATION LIMITATIONS: meal prep, cleaning, laundry, shopping, and community activity  PERSONAL FACTORS: Age, Past/current experiences, Time since onset of injury/illness/exacerbation, and 1-2 comorbidities: recent major life changes (see subjective) and recent left clavicular fracture  are also affecting patient's functional outcome.   REHAB POTENTIAL: Good  CLINICAL DECISION MAKING: Evolving/moderate complexity  EVALUATION COMPLEXITY: Moderate   GOALS: Goals reviewed with patient? Yes  SHORT TERM GOALS: Target date: 06/16/2023  Pt will be independent and compliant with initial stretching, strengthening, and balance HEP in order to maintain functional progress and improve mobility. Baseline: To be  established. Goal status: INITIAL  2.  Pt will decrease 5xSTS to </=13 seconds w/o UE in order to demonstrate decreased risk for falls and improved functional bilateral LE strength and power. Baseline: 15.79 sec hands on knees Goal status: INITIAL  3.  Pt will improve R rotation to at least 20 degrees in order to improve functional mobility and symmetry to left rotation. Baseline: R rotation 10 deg Goal status: INITIAL  4.  Pt will improve NDI score to </=16/50 in order to demonstrate improved pain management and quality of life. Baseline: 25/50 Goal status: INITIAL  5.  Pt will improve FGA score to >/=25/30 in order to demonstrate improved balance and decreased fall risk Baseline: 21/30 (10/30) Goal status: INITIAL  LONG TERM GOALS: Target date: 07/14/2023  Pt will be independent and compliant with  finalized stretching, strengthening, and balance HEP in order to maintain functional progress and improve mobility. Baseline: To be established. Goal status: INITIAL  2.  Pt will improve NDI score to </=7/50 in order to demonstrate improved pain management and quality of life. Baseline: 25/50 Goal status: INITIAL  3.  Pt will improve ABC Scale score to >/= 68.125% in order to demonstrate decreased fear of falling. Baseline: 58.125% (10/30) Goal status: INITIAL  4.  Pt will ambulate >/=1600 feet on to demonstrate improved functional endurance for home and community participation. Baseline: 1291 ft IND (10/30) Goal status: INITIAL  5.  Pt will improve FGA score to >/=29/30 in order to demonstrate improved balance and decreased fall risk Baseline: 21/30 (10/30) Goal status: INITIAL  PLAN:  PT FREQUENCY: 1x/week (due to pt schedule)  PT DURATION: 8 weeks  PLANNED INTERVENTIONS: [id number]- PT Re-evaluation, 97110-Therapeutic exercises, 97530- Therapeutic activity, 97112- Neuromuscular re-education, 97535- Self Care, [id number]- Manual therapy, [phone]- Gait training, Patient/Family  education, Balance training, Stair training, Taping, Dry Needling, Scar mobilization, Vestibular training, DME instructions, and Moist heat  PLAN FOR NEXT SESSION: Modify HEP prn for functional strengthening, cervical rotation, scar mobilization/left trap TP release/IASTM, and balance.  Multidirectional perturbations static and dynamic - pt personal goal of returning to walking her dog.  Trunk and hip mobility.  Left pec stretches?  Cervical flexion stretching.  Grip and fine motor exercises for LUE (buttons are difficult - OT referral, adaptive tool?)   Sadie Haber, PT, DPT 06/02/2023, 11:09 AM

## 2023-06-05 ENCOUNTER — Other Ambulatory Visit: Payer: Self-pay | Admitting: Family Medicine

## 2023-06-07 ENCOUNTER — Ambulatory Visit: Payer: Medicare Other | Admitting: Physical Therapy

## 2023-06-09 ENCOUNTER — Encounter: Payer: Self-pay | Admitting: Physical Therapy

## 2023-06-09 ENCOUNTER — Ambulatory Visit: Payer: Medicare Other | Admitting: Physical Therapy

## 2023-06-09 DIAGNOSIS — R278 Other lack of coordination: Secondary | ICD-10-CM | POA: Diagnosis not present

## 2023-06-09 DIAGNOSIS — R2689 Other abnormalities of gait and mobility: Secondary | ICD-10-CM

## 2023-06-09 DIAGNOSIS — M6281 Muscle weakness (generalized): Secondary | ICD-10-CM

## 2023-06-09 DIAGNOSIS — R2681 Unsteadiness on feet: Secondary | ICD-10-CM

## 2023-06-09 NOTE — Therapy (Signed)
OUTPATIENT PHYSICAL THERAPY CERVICAL TREATMENT   Patient Name: Rachel Duran MRN: [id number] DOB:[birth year], 67 y.o., female Today's Date: 06/09/2023  END OF SESSION:  PT End of Session - 06/09/23 1538     Visit Number 4    Number of Visits 9   8 + eval   Date for PT Re-Evaluation 07/21/23   pushed out due to potential scheduling conflicts   Authorization Type [employer] MEDICARE    Progress Note Due on Visit 10    PT Start Time 1535    PT Stop Time 1615    PT Time Calculation (min) 40 min    Equipment Utilized During Treatment Gait belt    Activity Tolerance Patient tolerated treatment well    Behavior During Therapy WFL for tasks assessed/performed             Past Medical History:  Diagnosis Date   Abdominal pain last 4 months   and nausea also   Allergy    Anemia    history of   Anxiety    Bipolar disorder (HCC)    atpical bipolar disorder   Cervical disc disease limited rom turning to left   hx. C6- C7 -hx. past fusion(bone graft used)   Cholecystitis    Chronic pain    DDD (degenerative disc disease), lumbar 09/2015   dextroscoliosis with multilevel DDD and facet arthrosis most notable for R foraminal disc protrusion L4/5 producing severe R neural foraminal stenosis abutting R L4 nerve root, moderate spinal canal and mild lat recesss and R neural foraminal stenosis L3/4 (MRI)   Depression    bipolar depression   Disorders of porphyrin metabolism    Felon of finger of left hand 11/10/2016   Fibromyalgia    GERD (gastroesophageal reflux disease)    Headache    occasionally   Internal hemorrhoids    Interstitial cystitis 06/06/2012   hx.   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)   Tuberculosis    Past Surgical History:  Procedure Laterality Date   ANTERIOR CERVICAL DECOMP/DISCECTOMY FUSION  2004   C5/6, C6/7   ANTERIOR CERVICAL DECOMP/DISCECTOMY FUSION  02/2016   C3/4, C4/5 with plating Lovell Sheehan)   ANTERIOR CERVICAL DECOMP/DISCECTOMY FUSION  12/29/2021   Procedure: ANTERIOR CERVICAL DECOMPRESSION/ DISCECTOMY FUSION CERVICAL THREE - SEVEN;  Surgeon: Tressie Stalker, MD;  Location: [facility] OR;  Service: Neurosurgery;;   ANTERIOR CERVICAL DECOMPRESSION/DISCECTOMY FUSION 4 LEVELS N/A 12/29/2021   Procedure: CERVICAL FIVE-SIX CORPECTOMY,;  Surgeon: Tressie Stalker, MD;  Location: [facility] OR;  Service: Neurosurgery;  Laterality: N/A;   AUGMENTATION MAMMAPLASTY Bilateral 03/25/2010   BREAST ENHANCEMENT SURGERY  2010   BREAST IMPLANT EXCHANGE  10/2014   exchange saline implants, B mastopexy/capsulorraphy ([facility])   BUNIONECTOMY Bilateral yrs ago   CESAREAN SECTION  1985   x 1   CHOLECYSTECTOMY  06/11/2012   Procedure: LAPAROSCOPIC CHOLECYSTECTOMY WITH INTRAOPERATIVE CHOLANGIOGRAM;  Surgeon: Atilano Ina, MD,FACS;  Location: [facility] ORS;  Service: General;  Laterality: N/A;   COLONOSCOPY  02/2018   done for positive cologuard - WNL, rpt 10 yrs Russella Dar)   CYSTOSCOPY     ERCP  05/22/2012   Procedure: ENDOSCOPIC RETROGRADE CHOLANGIOPANCREATOGRAPHY (ERCP);  Surgeon: Meryl Dare, MD,FACG;  Location: [facility];  Service: Endoscopy;  Laterality: N/A;   ERCP N/A 09/17/2013   Procedure: ENDOSCOPIC RETROGRADE CHOLANGIOPANCREATOGRAPHY (ERCP);  Surgeon: Meryl Dare, MD;  Location: [facility];  Service: Endoscopy;  Laterality: N/A;   ERCP N/A 09/27/2018   Procedure: ENDOSCOPIC RETROGRADE CHOLANGIOPANCREATOGRAPHY (ERCP);  Surgeon: Meryl Dare, MD;  Location: [facility];  Service:  Endoscopy;  Laterality: N/A;   ESOPHAGOGASTRODUODENOSCOPY  09/2016   WNL. esophagus dilated Russella Dar)   HEMORRHOID BANDING  09/23/2013   --Dr. Gaynelle Adu   HERNIA REPAIR     inguinal   HYSTEROSCOPY W/ ENDOMETRIAL ABLATION     KNEE ARTHROSCOPY Right 04/06/2021   Procedure: RIGHT KNEE ARTHROSCOPY WITH DEBRIDEMENT;  Surgeon: Cammy Copa, MD;  Location: [facility];  Service: Orthopedics;  Laterality: Right;   NASAL SINUS SURGERY     x5   PARTIAL KNEE ARTHROPLASTY Right 06/01/2020   Procedure: Right knee patellofemoral replacement;  Surgeon: Cammy Copa, MD;  Location: [facility];  Service: Orthopedics;  Laterality: Right;   POSTERIOR CERVICAL FUSION/FORAMINOTOMY N/A 12/29/2021   Procedure: POSTERIOR CERVICAL FUSION CERVICAL THREE-SEVEN;  Surgeon: Tressie Stalker, MD;  Location: [facility] OR;  Service: Neurosurgery;  Laterality: N/A;   REMOVAL OF STONES  09/27/2018   Procedure: REMOVAL OF STONES;  Surgeon: Meryl Dare, MD;  Location: [facility];  Service: Endoscopy;;   SPHINCTEROTOMY  09/27/2018   Procedure: SPHINCTEROTOMY;  Surgeon: Meryl Dare, MD;  Location: [facility];  Service: Endoscopy;;   TONSILLECTOMY     removed as a child   TOTAL HIP ARTHROPLASTY Left 03/12/2019   Procedure: LEFT TOTAL HIP ARTHROPLASTY ANTERIOR APPROACH;  Magnus Ivan, Vanita Panda, MD)   UPPER GASTROINTESTINAL ENDOSCOPY     Patient Active Problem List   Diagnosis Date Noted   Onycholysis 04/10/2023   Grieving 06/27/2022   Elevated blood pressure reading in office without diagnosis of hypertension 05/31/2022   Closed nondisplaced fracture of lateral end of left clavicle 05/30/2022   Fusion of spine, cervical region    Cervical spine arthritis with nerve pain 12/29/2021   C6 cervical fracture (HCC) 12/29/2021   C5 cervical fracture (HCC) 12/21/2021   Advanced directives, counseling/discussion 09/23/2021   Swelling of right index finger 07/14/2021   Synovitis  of right knee    Scoliosis 01/13/2021   Osteopenia 07/02/2020   Status post left knee surgery 06/01/2020   Pulmonary nodule 05/27/2020   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   Chronic patellofemoral pain of right knee 05/05/2020   Positive ANA (antinuclear antibody) 06/05/2019   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   Unilateral primary osteoarthritis, left hip 01/28/2019   Common bile duct (CBD) obstruction    Venous insufficiency of left lower extremity 07/04/2018   Dyslipidemia 09/22/2017   DDD (degenerative disc disease), cervical 04/03/2017   DDD (degenerative disc disease), lumbar 04/03/2017   Onychomycosis 03/21/2017   Dysphagia 10/18/2016   Encounter for chronic pain management 10/18/2016   Biliary stasis 09/26/2016   Dry mouth 06/06/2016   HNP (herniated nucleus pulposus), lumbar 09/23/2015   Iron deficiency 07/30/2015   Health maintenance examination 06/15/2015   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   Clavicle enlargement 12/13/2014   Prediabetes 12/13/2014   Plantar fasciitis, bilateral 09/11/2014  Medicare annual wellness visit, subsequent 06/10/2014   Abnormal thyroid function test 06/10/2014   Chronic pain syndrome 06/10/2014   CFS (chronic fatigue syndrome) 04/08/2014   Postmenopausal atrophic vaginitis 10/19/2012   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   Cervical disc disorder with radiculopathy of cervical region 05/28/2010   APHTHOUS ULCERS 01/31/2008   Insomnia 11/09/2007   Constipation 10/11/2007   Allergic rhinitis 04/12/2007   MDD (major depressive disorder), recurrent episode, moderate (HCC) 03/05/2007   [name] syndrome 03/05/2007   GERD 03/05/2007   ROSACEA 03/05/2007   NEURALGIA 03/05/2007   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   Chronic interstitial cystitis 12/06/2006   Fibromyalgia 12/06/2006    PCP: Eustaquio Boyden, MD  REFERRING PROVIDER: Floreen Comber, NP  REFERRING DIAG: S12.400G  (ICD-10-CM) - Unspecified displaced fracture of fifth cervical vertebra, subsequent encounter for fracture with delayed healing  THERAPY DIAG:  Other lack of coordination  Muscle weakness (generalized)  Unsteadiness on feet  Other abnormalities of gait and mobility  Rationale for Evaluation and Treatment: Rehabilitation  ONSET DATE: 04/05/2023 (referral date)  SUBJECTIVE:                                                                                                                                                                                                         SUBJECTIVE STATEMENT: Patient states she has had a busy day, but rested better last night.  She denies falls or acute changes.  She has not had as much time to do her HEP this week due to schedule. Hand dominance: Right  PERTINENT HISTORY:  Anxiety, Bipolar Disorder, depression, fibromyalgia, IBS, [name] Disease, Scleroderma, Stargardt's disease, Tuberculosis, HTN, left THA 2020, right partial knee arthroplasty, ACDF C3-C7 12/29/2021 (prior procedures 02/2016 and 2004), left clavicle frature 05/27/2022  PAIN:  Are you having pain? Yes: NPRS scale: 5/10 Pain location: upper cervical spine and proximal upper traps and right knee; left thumb and forefinger Pain description: stiffness, achy, throbbing, nerve pain in left hand, tight Aggravating factors: general movement; laying flat Relieving factors: heating pad  PRECAUTIONS: Fall  RED FLAGS: None     WEIGHT BEARING RESTRICTIONS: No  FALLS:  Has patient fallen in last 6 months? No  LIVING ENVIRONMENT: Lives with: lives with their spouse Lives in: House/apartment Stairs: Yes: Internal: 14 steps; on left going up; garage entrance has 1 step up, but front is level entry and she uses both. Has following equipment at home: Single point cane, Walker - 2 wheeled, shower chair, Shower bench, and Grab bars  OCCUPATION: Retired  PLOF: Independent -  pt has not walked  dog since her fall in November and she would like to get back to this.  PATIENT GOALS: "To get back to walking my dog"  NEXT MD VISIT: 06/05/2023 Devona Konig (Urology)  OBJECTIVE:  Note: Objective measures were completed at Evaluation unless otherwise noted.  DIAGNOSTIC FINDINGS:  Left Clavicle x-ray 08/03/2022 AP and oblique views of the left clavicle reviewed.  Callus formation is  noted.  Still some lucency around the fracture ends but no evidence of  definite nonunion.  Shoulder is located.  Overall positive interval change  compared to prior radiographs  PATIENT SURVEYS:  ABC scale To be assessed. NDI 25/50 =moderate perceived disability  COGNITION: Overall cognitive status: Within functional limits for tasks assessed  SENSATION: WFL - some right N/T localized to kneecap  POSTURE: forward head - mild  PALPATION: Mid-cervical paraspinal tenderness and palpable dropoff from cervical flattening around C6/C7.  Thoracic tightness with cervical movement and palpation.   CERVICAL ROM:   Active ROM A/PROM (deg) eval  Flexion   Extension   Right lateral flexion   Left lateral flexion   Right rotation 10  Left rotation 23   (Blank rows = not tested)  UPPER EXTREMITY ROM:  Active ROM Right eval Left eval  Shoulder flexion [facility]  Shoulder extension    Shoulder abduction [facility]  Shoulder adduction    Shoulder extension    Shoulder internal rotation    Shoulder external rotation    Elbow flexion    Elbow extension    Wrist flexion    Wrist extension    Wrist ulnar deviation    Wrist radial deviation    Wrist pronation    Wrist supination     (Blank rows = not tested)  UPPER EXTREMITY MMT:  MMT Right eval Left eval  Shoulder flexion    Shoulder extension    Shoulder abduction    Shoulder adduction    Shoulder extension    Shoulder internal rotation    Shoulder external rotation    Middle trapezius    Lower trapezius    Elbow flexion     Elbow extension    Wrist flexion    Wrist extension    Wrist ulnar deviation    Wrist radial deviation    Wrist pronation    Wrist supination    Grip strength     (Blank rows = not tested)  CERVICAL SPECIAL TESTS:  Pt unable to tolerate due to pain.  FUNCTIONAL TESTS:  5 times sit to stand: 15.79 seconds hands-on knees 6 minute walk test: To be assessed. Functional gait assessment: To be assessed.  TODAY'S TREATMENT:                                                                                                                              DATE: 06/09/2023  Manual Therapy/TA: -STM/scar mobilization to anterior, lateral, and posterior neck scars to improve pain  management and ROM due to superficial tightness.  Right rib STM, rhomboid trigger point release.  Further release technique with horizontal and vertical foam roller use.  Tried tennis ball circles for gentle mobilization in this area as well.  Education on techniques for home and having husband assist.    TherEx: -Hug stretch w/ PT spending time adjusting form to prevent excessive shoulder shrug > reduced to right only w/ continued facilitation, pt reporting intense stretch x3 rounds -Seated rotation stretch x5 each side for 5 seconds each, PT provides tactile cues to improve depth of stretch and hand placement  PATIENT EDUCATION:  Education details: Continue HEP - rotation stretch addition.  See above.  Discussed bracing related to scoliosis as pt expresses concern about progression, discussed potential indications for a brace vs drawbacks and encouraged conversation with her neurosurgeon about this.  Discussed benefits of stretching and strengthening as indicated for diagnosis to compliment other tactics. Person educated: Patient Education method: Explanation Education comprehension: verbalized understanding  HOME EXERCISE PROGRAM: Access Code: [email or web address] URL: [email or web address] Date: 05/24/2023 Prepared  by: Camille Bal  Exercises - Scapular retraction with resistance  - 1 x daily - 3-4 x weekly - 1-2 sets - 12 reps - 2 seconds hold - Seated Shoulder Diagonal Pulls with Resistance  - 1 x daily - 3-4 x weekly - 2 sets - 10 reps -Seated rotation stretch - pt did not want printout 11/15  ASSESSMENT:  CLINICAL IMPRESSION: Focus of skilled session today on pain management and general thoracic and cervical mobility.  Her scars had good mobility today with upper anterior scar appearing to have more thickening than others so encouraged more work over this area to keep myofascial tissue loose and pliable.  She had positive pain response to methods used today.  Will continue to address thoracic mobility to compliment POC.  OBJECTIVE IMPAIRMENTS: decreased activity tolerance, decreased balance, decreased coordination, decreased endurance, difficulty walking, decreased ROM, decreased strength, increased fascial restrictions, impaired flexibility, impaired sensation, improper body mechanics, postural dysfunction, and pain.   ACTIVITY LIMITATIONS: carrying, lifting, bending, squatting, stairs, transfers, bed mobility, reach over head, locomotion level, and caring for others  PARTICIPATION LIMITATIONS: meal prep, cleaning, laundry, shopping, and community activity  PERSONAL FACTORS: Age, Past/current experiences, Time since onset of injury/illness/exacerbation, and 1-2 comorbidities: recent major life changes (see subjective) and recent left clavicular fracture  are also affecting patient's functional outcome.   REHAB POTENTIAL: Good  CLINICAL DECISION MAKING: Evolving/moderate complexity  EVALUATION COMPLEXITY: Moderate   GOALS: Goals reviewed with patient? Yes  SHORT TERM GOALS: Target date: 06/16/2023  Pt will be independent and compliant with initial stretching, strengthening, and balance HEP in order to maintain functional progress and improve mobility. Baseline: To be established. Goal  status: INITIAL  2.  Pt will decrease 5xSTS to </=13 seconds w/o UE in order to demonstrate decreased risk for falls and improved functional bilateral LE strength and power. Baseline: 15.79 sec hands on knees Goal status: INITIAL  3.  Pt will improve R rotation to at least 20 degrees in order to improve functional mobility and symmetry to left rotation. Baseline: R rotation 10 deg Goal status: INITIAL  4.  Pt will improve NDI score to </=16/50 in order to demonstrate improved pain management and quality of life. Baseline: 25/50 Goal status: INITIAL  5.  Pt will improve FGA score to >/=25/30 in order to demonstrate improved balance and decreased fall risk Baseline: 21/30 (10/30) Goal status: INITIAL  LONG TERM GOALS: Target date: 07/14/2023  Pt will be independent and compliant with finalized stretching, strengthening, and balance HEP in order to maintain functional progress and improve mobility. Baseline: To be established. Goal status: INITIAL  2.  Pt will improve NDI score to </=7/50 in order to demonstrate improved pain management and quality of life. Baseline: 25/50 Goal status: INITIAL  3.  Pt will improve ABC Scale score to >/= 68.125% in order to demonstrate decreased fear of falling. Baseline: 58.125% (10/30) Goal status: INITIAL  4.  Pt will ambulate >/=1600 feet on to demonstrate improved functional endurance for home and community participation. Baseline: 1291 ft IND (10/30) Goal status: INITIAL  5.  Pt will improve FGA score to >/=29/30 in order to demonstrate improved balance and decreased fall risk Baseline: 21/30 (10/30) Goal status: INITIAL  PLAN:  PT FREQUENCY: 1x/week (due to pt schedule)  PT DURATION: 8 weeks  PLANNED INTERVENTIONS: 97164- PT Re-evaluation, 97110-Therapeutic exercises, 97530- Therapeutic activity, 97112- Neuromuscular re-education, 97535- Self Care, [id number]- Manual therapy, [phone]- Gait training, Patient/Family education, Balance  training, Stair training, Taping, Dry Needling, Scar mobilization, Vestibular training, DME instructions, and Moist heat  PLAN FOR NEXT SESSION: Modify HEP prn for functional strengthening, cervical rotation, scar mobilization/left trap TP release/IASTM, and balance.  Multidirectional perturbations static and dynamic - pt personal goal of returning to walking her dog.  Trunk and hip mobility.  Left pec stretches?  Cervical flexion stretching.  Grip and fine motor exercises for LUE (buttons are difficult - OT referral, adaptive tool?)  Thoracic mobility, right rib hump stretching/mobilizing?   Sadie Haber, PT, DPT 06/09/2023, 4:44 PM

## 2023-06-13 ENCOUNTER — Other Ambulatory Visit: Payer: Self-pay | Admitting: Gastroenterology

## 2023-06-13 ENCOUNTER — Other Ambulatory Visit: Payer: Self-pay | Admitting: Physician Assistant

## 2023-06-13 NOTE — Telephone Encounter (Signed)
Last Fill: 02/20/2023   Next Visit: 07/27/2023   Last Visit: 02/06/2023   Dx: [name] disease without gangrene    Current Dose per office note on 02/06/2023: amlodipine 7.5 mg daily    Okay to refill Amlodipine?

## 2023-06-14 ENCOUNTER — Encounter: Payer: Self-pay | Admitting: Family Medicine

## 2023-06-15 ENCOUNTER — Encounter: Payer: Self-pay | Admitting: Orthopedic Surgery

## 2023-06-15 MED ORDER — CLINDAMYCIN HCL 150 MG PO CAPS: ORAL_CAPSULE | ORAL | 0 refills | Status: DC

## 2023-06-16 ENCOUNTER — Encounter: Payer: Self-pay | Admitting: Physical Therapy

## 2023-06-16 ENCOUNTER — Ambulatory Visit: Payer: Medicare Other | Admitting: Physical Therapy

## 2023-06-16 DIAGNOSIS — M6281 Muscle weakness (generalized): Secondary | ICD-10-CM

## 2023-06-16 DIAGNOSIS — R2681 Unsteadiness on feet: Secondary | ICD-10-CM

## 2023-06-16 DIAGNOSIS — R278 Other lack of coordination: Secondary | ICD-10-CM | POA: Diagnosis not present

## 2023-06-16 DIAGNOSIS — R2689 Other abnormalities of gait and mobility: Secondary | ICD-10-CM

## 2023-06-16 NOTE — Therapy (Unsigned)
OUTPATIENT PHYSICAL THERAPY CERVICAL TREATMENT   Patient Name: Emmalina Santillano MRN: [id number] DOB:1956/05/24, 67 y.o., female Today's Date: 06/16/2023  END OF SESSION:  PT End of Session - 06/16/23 1057     Visit Number 5    Number of Visits 9   8 + eval   Date for PT Re-Evaluation 07/21/23   pushed out due to potential scheduling conflicts   Authorization Type [employer] MEDICARE    Progress Note Due on Visit 10    PT Start Time 1054    PT Stop Time 1140    PT Time Calculation (min) 46 min    Equipment Utilized During Treatment Gait belt    Activity Tolerance Patient tolerated treatment well    Behavior During Therapy WFL for tasks assessed/performed             Past Medical History:  Diagnosis Date   Abdominal pain last 4 months   and nausea also   Allergy    Anemia    history of   Anxiety    Bipolar disorder (HCC)    atpical bipolar disorder   Cervical disc disease limited rom turning to left   hx. C6- C7 -hx. past fusion(bone graft used)   Cholecystitis    Chronic pain    DDD (degenerative disc disease), lumbar 09/2015   dextroscoliosis with multilevel DDD and facet arthrosis most notable for R foraminal disc protrusion L4/5 producing severe R neural foraminal stenosis abutting R L4 nerve root, moderate spinal canal and mild lat recesss and R neural foraminal stenosis L3/4 (MRI)   Depression    bipolar depression   Disorders of porphyrin metabolism    Felon of finger of left hand 11/10/2016   Fibromyalgia    GERD (gastroesophageal reflux disease)    Headache    occasionally   Internal hemorrhoids    Interstitial cystitis 06/06/2012   hx.   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)   Tuberculosis    Past Surgical History:  Procedure Laterality Date   ANTERIOR CERVICAL DECOMP/DISCECTOMY FUSION  2004   C5/6, C6/7   ANTERIOR CERVICAL DECOMP/DISCECTOMY FUSION  02/2016   C3/4, C4/5 with plating Lovell Sheehan)   ANTERIOR CERVICAL DECOMP/DISCECTOMY FUSION  12/29/2021   Procedure: ANTERIOR CERVICAL DECOMPRESSION/ DISCECTOMY FUSION CERVICAL THREE - SEVEN;  Surgeon: Tressie Stalker, MD;  Location: [facility] OR;  Service: Neurosurgery;;   ANTERIOR CERVICAL DECOMPRESSION/DISCECTOMY FUSION 4 LEVELS N/A 12/29/2021   Procedure: CERVICAL FIVE-SIX CORPECTOMY,;  Surgeon: Tressie Stalker, MD;  Location: [facility] OR;  Service: Neurosurgery;  Laterality: N/A;   AUGMENTATION MAMMAPLASTY Bilateral 03/25/2010   BREAST ENHANCEMENT SURGERY  2010   BREAST IMPLANT EXCHANGE  10/2014   exchange saline implants, B mastopexy/capsulorraphy ([facility])   BUNIONECTOMY Bilateral yrs ago   CESAREAN SECTION  1985   x 1   CHOLECYSTECTOMY  06/11/2012   Procedure: LAPAROSCOPIC CHOLECYSTECTOMY WITH INTRAOPERATIVE CHOLANGIOGRAM;  Surgeon: Atilano Ina, MD,FACS;  Location: [facility] ORS;  Service: General;  Laterality: N/A;   COLONOSCOPY  02/2018   done for positive cologuard - WNL, rpt 10 yrs Russella Dar)   CYSTOSCOPY     ERCP  05/22/2012   Procedure: ENDOSCOPIC RETROGRADE CHOLANGIOPANCREATOGRAPHY (ERCP);  Surgeon: Meryl Dare, MD,FACG;  Location: [facility];  Service: Endoscopy;  Laterality: N/A;   ERCP N/A 09/17/2013   Procedure: ENDOSCOPIC RETROGRADE CHOLANGIOPANCREATOGRAPHY (ERCP);  Surgeon: Meryl Dare, MD;  Location: [facility];  Service: Endoscopy;  Laterality: N/A;   ERCP N/A 09/27/2018   Procedure: ENDOSCOPIC RETROGRADE CHOLANGIOPANCREATOGRAPHY (ERCP);  Surgeon: Meryl Dare, MD;  Location: [facility];  Service:  Endoscopy;  Laterality: N/A;   ESOPHAGOGASTRODUODENOSCOPY  09/2016   WNL. esophagus dilated Russella Dar)   HEMORRHOID BANDING  09/23/2013   --Dr. Gaynelle Adu   HERNIA REPAIR     inguinal   HYSTEROSCOPY W/ ENDOMETRIAL ABLATION     KNEE ARTHROSCOPY Right 04/06/2021   Procedure: RIGHT KNEE ARTHROSCOPY WITH DEBRIDEMENT;  Surgeon: Cammy Copa, MD;  Location: [facility];  Service: Orthopedics;  Laterality: Right;   NASAL SINUS SURGERY     x5   PARTIAL KNEE ARTHROPLASTY Right 06/01/2020   Procedure: Right knee patellofemoral replacement;  Surgeon: Cammy Copa, MD;  Location: [facility];  Service: Orthopedics;  Laterality: Right;   POSTERIOR CERVICAL FUSION/FORAMINOTOMY N/A 12/29/2021   Procedure: POSTERIOR CERVICAL FUSION CERVICAL THREE-SEVEN;  Surgeon: Tressie Stalker, MD;  Location: [facility] OR;  Service: Neurosurgery;  Laterality: N/A;   REMOVAL OF STONES  09/27/2018   Procedure: REMOVAL OF STONES;  Surgeon: Meryl Dare, MD;  Location: [facility];  Service: Endoscopy;;   SPHINCTEROTOMY  09/27/2018   Procedure: SPHINCTEROTOMY;  Surgeon: Meryl Dare, MD;  Location: [facility];  Service: Endoscopy;;   TONSILLECTOMY     removed as a child   TOTAL HIP ARTHROPLASTY Left 03/12/2019   Procedure: LEFT TOTAL HIP ARTHROPLASTY ANTERIOR APPROACH;  Magnus Ivan, Vanita Panda, MD)   UPPER GASTROINTESTINAL ENDOSCOPY     Patient Active Problem List   Diagnosis Date Noted   Onycholysis 04/10/2023   Grieving 06/27/2022   Elevated blood pressure reading in office without diagnosis of hypertension 05/31/2022   Closed nondisplaced fracture of lateral end of left clavicle 05/30/2022   Fusion of spine, cervical region    Cervical spine arthritis with nerve pain 12/29/2021   C6 cervical fracture (HCC) 12/29/2021   C5 cervical fracture (HCC) 12/21/2021   Advanced directives, counseling/discussion 09/23/2021   Swelling of right index finger 07/14/2021   Synovitis  of right knee    Scoliosis 01/13/2021   Osteopenia 07/02/2020   Status post left knee surgery 06/01/2020   Pulmonary nodule 05/27/2020   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   Chronic patellofemoral pain of right knee 05/05/2020   Positive ANA (antinuclear antibody) 06/05/2019   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   Unilateral primary osteoarthritis, left hip 01/28/2019   Common bile duct (CBD) obstruction    Venous insufficiency of left lower extremity 07/04/2018   Dyslipidemia 09/22/2017   DDD (degenerative disc disease), cervical 04/03/2017   DDD (degenerative disc disease), lumbar 04/03/2017   Onychomycosis 03/21/2017   Dysphagia 10/18/2016   Encounter for chronic pain management 10/18/2016   Biliary stasis 09/26/2016   Dry mouth 06/06/2016   HNP (herniated nucleus pulposus), lumbar 09/23/2015   Iron deficiency 07/30/2015   Health maintenance examination 06/15/2015   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   Clavicle enlargement 12/13/2014   Prediabetes 12/13/2014   Plantar fasciitis, bilateral 09/11/2014  Medicare annual wellness visit, subsequent 06/10/2014   Abnormal thyroid function test 06/10/2014   Chronic pain syndrome 06/10/2014   CFS (chronic fatigue syndrome) 04/08/2014   Postmenopausal atrophic vaginitis 10/19/2012   Positive QuantiFERON-TB Gold test 02/07/2012   Cervical disc disorder with radiculopathy of cervical region 05/28/2010   APHTHOUS ULCERS 01/31/2008   Insomnia 11/09/2007   Constipation 10/11/2007   Allergic rhinitis 04/12/2007   MDD (major depressive disorder), recurrent episode, moderate (HCC) 03/05/2007   [name] syndrome 03/05/2007   GERD 03/05/2007   ROSACEA 03/05/2007   NEURALGIA 03/05/2007   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   Chronic interstitial cystitis 12/06/2006   Fibromyalgia 12/06/2006    PCP: Eustaquio Boyden, MD  REFERRING PROVIDER: Floreen Comber, NP  REFERRING DIAG: S12.400G  (ICD-10-CM) - Unspecified displaced fracture of fifth cervical vertebra, subsequent encounter for fracture with delayed healing  THERAPY DIAG:  Other lack of coordination  Muscle weakness (generalized)  Unsteadiness on feet  Other abnormalities of gait and mobility  Rationale for Evaluation and Treatment: Rehabilitation  ONSET DATE: 04/05/2023 (referral date)  SUBJECTIVE:                                                                                                                                                                                                         SUBJECTIVE STATEMENT: Patient has been unable to rest well this week and her pain level has been higher this week as it is the 1-year anniversary of her daughter's passing.  She has been doing her HEP and states it feels challenging, but does not contribute to pain. Hand dominance: Right  PERTINENT HISTORY:  Anxiety, Bipolar Disorder, depression, fibromyalgia, IBS, [name] Disease, Scleroderma, Stargardt's disease, Tuberculosis, HTN, left THA 2020, right partial knee arthroplasty, ACDF C3-C7 12/29/2021 (prior procedures 02/2016 and 2004), left clavicle frature 05/27/2022  PAIN:  Are you having pain? Yes: NPRS scale: 6/10 Pain location: upper cervical spine and proximal upper traps and right knee; left thumb and forefinger Pain description: stiffness, achy, throbbing, nerve pain in left hand, tight Aggravating factors: general movement; laying flat Relieving factors: heating pad  PRECAUTIONS: Fall  RED FLAGS: None     WEIGHT BEARING RESTRICTIONS: No  FALLS:  Has patient fallen in last 6 months? No  LIVING ENVIRONMENT: Lives with: lives with their spouse Lives in: House/apartment Stairs: Yes: Internal: 14 steps; on left going up; garage entrance has 1 step up, but front is level entry and she uses both. Has following equipment at home: Single point cane, Walker - 2 wheeled, shower chair, Shower bench,  and Grab  bars  OCCUPATION: Retired  PLOF: Independent - pt has not walked dog since her fall in November and she would like to get back to this.  PATIENT GOALS: "To get back to walking my dog"  NEXT MD VISIT: 06/05/2023 Devona Konig (Urology)  OBJECTIVE:  Note: Objective measures were completed at Evaluation unless otherwise noted.  DIAGNOSTIC FINDINGS:  Left Clavicle x-ray 08/03/2022 [facility] and oblique views of the left clavicle reviewed.  Callus formation is  noted.  Still some lucency around the fracture ends but no evidence of  definite nonunion.  Shoulder is located.  Overall positive interval change  compared to prior radiographs  PATIENT SURVEYS:  ABC scale To be assessed. NDI 25/50 =moderate perceived disability  COGNITION: Overall cognitive status: Within functional limits for tasks assessed  SENSATION: WFL - some right N/T localized to kneecap  POSTURE: forward head - mild  PALPATION: Mid-cervical paraspinal tenderness and palpable dropoff from cervical flattening around C6/C7.  Thoracic tightness with cervical movement and palpation.   CERVICAL ROM:   Active ROM A/PROM (deg) eval  Flexion   Extension   Right lateral flexion   Left lateral flexion   Right rotation 10  Left rotation 23   (Blank rows = not tested)  UPPER EXTREMITY ROM:  Active ROM Right eval Left eval  Shoulder flexion [facility]  Shoulder extension    Shoulder abduction [facility]  Shoulder adduction    Shoulder extension    Shoulder internal rotation    Shoulder external rotation    Elbow flexion    Elbow extension    Wrist flexion    Wrist extension    Wrist ulnar deviation    Wrist radial deviation    Wrist pronation    Wrist supination     (Blank rows = not tested)  UPPER EXTREMITY MMT:  MMT Right eval Left eval  Shoulder flexion    Shoulder extension    Shoulder abduction    Shoulder adduction    Shoulder extension    Shoulder internal rotation    Shoulder external  rotation    Middle trapezius    Lower trapezius    Elbow flexion    Elbow extension    Wrist flexion    Wrist extension    Wrist ulnar deviation    Wrist radial deviation    Wrist pronation    Wrist supination    Grip strength     (Blank rows = not tested)  CERVICAL SPECIAL TESTS:  Pt unable to tolerate due to pain.  FUNCTIONAL TESTS:  5 times sit to stand: 15.79 seconds hands-on knees 6 minute walk test: To be assessed. Functional gait assessment: To be assessed.  TODAY'S TREATMENT:                                                                                                                              DATE: 06/16/2023  TherACT: -Verbally reviewed HEP  and confirmed pt has updated copy, is compliant and feels safe and challenged with all tasks -R rotation:  25 degs to R, mild R muscle tightness -5xSTS:  14.57 sec hands on knees -Discussed fear of falling, hardware protection concerns (encouraged dialogue with MD about further concerns), discussed balance strategies and goals of future sessions, provided therapeutic listening and encouragement regarding progress.  Pt inquires about correct way to fall, PT discussed alternatives to protect head and neck and general spontaneous nature of some falls making it difficult to fall the "perfect way" especially with patient's surgical history considerations. -NDI:  21/50 -FGA:  19/30  Manual Therapy: -STM/ to right ribcage and thoracic paraspinals for pain management and gentle stretching, Grade 1 and 2 rib mobs for comfort, pt reports mild improvement in pain at end of session to 5/10.  PATIENT EDUCATION:  Education details: Continue HEP - rotation stretch addition.  See above. Person educated: Patient Education method: Explanation Education comprehension: verbalized understanding  HOME EXERCISE PROGRAM: Access Code: [email or web address] URL: [email or web address] Date: 05/24/2023 Prepared by: Camille Bal  Exercises - Scapular retraction with resistance  - 1 x daily - 3-4 x weekly - 1-2 sets - 12 reps - 2 seconds hold - Seated Shoulder Diagonal Pulls with Resistance  - 1 x daily - 3-4 x weekly - 2 sets - 10 reps -Seated rotation stretch - pt did not want printout 11/15  ASSESSMENT:  CLINICAL IMPRESSION: Focus of skilled session today on pain management and general thoracic and cervical mobility.  Her scars had good mobility today with upper anterior scar appearing to have more thickening than others so encouraged more work over this area to keep myofascial tissue loose and pliable.  She had positive pain response to methods used today.  Will continue to address thoracic mobility to compliment POC.  OBJECTIVE IMPAIRMENTS: decreased activity tolerance, decreased balance, decreased coordination, decreased endurance, difficulty walking, decreased ROM, decreased strength, increased fascial restrictions, impaired flexibility, impaired sensation, improper body mechanics, postural dysfunction, and pain.   ACTIVITY LIMITATIONS: carrying, lifting, bending, squatting, stairs, transfers, bed mobility, reach over head, locomotion level, and caring for others  PARTICIPATION LIMITATIONS: meal prep, cleaning, laundry, shopping, and community activity  PERSONAL FACTORS: Age, Past/current experiences, Time since onset of injury/illness/exacerbation, and 1-2 comorbidities: recent major life changes (see subjective) and recent left clavicular fracture  are also affecting patient's functional outcome.   REHAB POTENTIAL: Good  CLINICAL DECISION MAKING: Evolving/moderate complexity  EVALUATION COMPLEXITY: Moderate   GOALS: Goals reviewed with patient? Yes  SHORT TERM GOALS: Target date: 06/16/2023  Pt will be independent and compliant with initial stretching, strengthening, and balance HEP in order to maintain functional progress and improve mobility. Baseline: Established and pt compliant and IND  (11/22) Goal status: MET  2.  Pt will decrease 5xSTS to </=13 seconds w/o UE in order to demonstrate decreased risk for falls and improved functional bilateral LE strength and power. Baseline: 15.79 sec hands on knees; 14.57 sec hands on knees (11/22) Goal status: IN PROGRESS  3.  Pt will improve R rotation to at least 20 degrees in order to improve functional mobility and symmetry to left rotation. Baseline: R rotation 10 deg; 25 degs to R, mild R tightness (11/22) Goal status: MET  4.  Pt will improve NDI score to </=16/50 in order to demonstrate improved pain management and quality of life. Baseline: 25/50; 21/50 (11/22) Goal status: IN PROGRESS  5.  Pt will improve FGA score to >/=25/30 in order to demonstrate  improved balance and decreased fall risk Baseline: 21/30 (10/30); 19/30 (11/22) Goal status: NOT MET  LONG TERM GOALS: Target date: 07/14/2023  Pt will be independent and compliant with finalized stretching, strengthening, and balance HEP in order to maintain functional progress and improve mobility. Baseline: To be established. Goal status: INITIAL  2.  Pt will improve NDI score to </=7/50 in order to demonstrate improved pain management and quality of life. Baseline: 25/50 Goal status: INITIAL  3.  Pt will improve ABC Scale score to >/= 68.125% in order to demonstrate decreased fear of falling. Baseline: 58.125% (10/30) Goal status: INITIAL  4.  Pt will ambulate >/=1600 feet on to demonstrate improved functional endurance for home and community participation. Baseline: 1291 ft IND (10/30) Goal status: INITIAL  5.  Pt will improve FGA score to >/=29/30 in order to demonstrate improved balance and decreased fall risk Baseline: 21/30 (10/30) Goal status: INITIAL  PLAN:  PT FREQUENCY: 1x/week (due to pt schedule)  PT DURATION: 8 weeks  PLANNED INTERVENTIONS: 97164- PT Re-evaluation, 97110-Therapeutic exercises, 97530- Therapeutic activity, 97112-  Neuromuscular re-education, 97535- Self Care, 33295- Manual therapy, [phone]- Gait training, Patient/Family education, Balance training, Stair training, Taping, Dry Needling, Scar mobilization, Vestibular training, DME instructions, and Moist heat  PLAN FOR NEXT SESSION: Modify HEP prn for functional strengthening, cervical rotation, scar mobilization/left trap TP release/IASTM, and balance.  Multidirectional perturbations static and dynamic - pt personal goal of returning to walking her dog.  Trunk and hip mobility.  Left pec stretches?  Cervical flexion stretching.  Grip and fine motor exercises for LUE (buttons are difficult - OT referral, adaptive tool?)  Thoracic mobility, right rib hump stretching/mobilizing?   Sadie Haber, PT, DPT 06/16/2023, 12:46 PM

## 2023-06-19 ENCOUNTER — Other Ambulatory Visit: Payer: Self-pay | Admitting: Family Medicine

## 2023-06-19 DIAGNOSIS — G9332 Myalgic encephalomyelitis/chronic fatigue syndrome: Secondary | ICD-10-CM

## 2023-06-20 ENCOUNTER — Ambulatory Visit: Payer: Medicare Other | Admitting: Physical Therapy

## 2023-06-20 ENCOUNTER — Ambulatory Visit: Payer: Medicare Other | Admitting: Family Medicine

## 2023-06-20 ENCOUNTER — Ambulatory Visit (INDEPENDENT_AMBULATORY_CARE_PROVIDER_SITE_OTHER)
Admission: RE | Admit: 2023-06-20 | Discharge: 2023-06-20 | Disposition: A | Discharge status: Home / Self Care | Payer: Medicare Other | Source: Ambulatory Visit | Attending: Family Medicine | Admitting: Family Medicine

## 2023-06-20 ENCOUNTER — Encounter: Payer: Self-pay | Admitting: Family Medicine

## 2023-06-20 VITALS — BP 134/74 | HR 97 | Temp 98.8°F | Ht 63.25 in | Wt 116.5 lb

## 2023-06-20 DIAGNOSIS — R053 Chronic cough: Secondary | ICD-10-CM | POA: Diagnosis not present

## 2023-06-20 DIAGNOSIS — M349 Systemic sclerosis, unspecified: Secondary | ICD-10-CM | POA: Diagnosis not present

## 2023-06-20 MED ORDER — PREDNISONE 20 MG PO TABS: ORAL_TABLET | ORAL | 0 refills | Status: DC

## 2023-06-20 MED ORDER — DOXYCYCLINE HYCLATE 100 MG PO TABS: 100.0000 mg | ORAL_TABLET | Freq: Two times a day (BID) | ORAL | 0 refills | Status: DC

## 2023-06-20 NOTE — Patient Instructions (Addendum)
Xray today  I do want to you to start prednisone taper sent to pharmacy along with doxycycline antibiotic.  Let us know if not improving with this.

## 2023-06-20 NOTE — Telephone Encounter (Signed)
Last office visit 04/10/2023 for Medical Management of chronic issues.  Last refilled Adderall XR 03/22/2023 for #90 with no refills.  Vit B12 09/08/2022 for 3 ml with 3 refills.  Next Appt: 06/20/23 for chronic cough.

## 2023-06-20 NOTE — Progress Notes (Signed)
Ph: [phone] Fax: [phone]   Patient ID: Rachel Duran, female    DOB: 04-28-1956, 67 y.o.   MRN: [id number]  This visit was conducted in person.  BP 134/74   Pulse 97   Temp 98.8 F (37.1 C) (Oral)   Ht 5' 3.25" (1.607 m)   Wt 116 lb 8 oz (52.8 kg)   LMP 07/25/1998   SpO2 95%   BMI 20.47 kg/m    CC: chronic cough  Subjective:   HPI: Rachel Duran is a 67 y.o. female presenting on 06/20/2023 for Cough (C/o ongoing cough for some mos. Seen for previously. Seems to be worsening. Has pulmonology f/u in 07/2023./Also, pt provided [place] [employer]ac[employer] form to be completed. )   Known limited SS/Scl sees rheum.   Cough with congestion present since summer 2024, worse with cold weather or cold foods (ice cream or sweet tea with ice). Cough associated with colored sputum production. She received azithromycin course back in 09/2022 for this with benefit but never resolution. Symptoms again worsened ~2 months ago. Noted some wheezing this morning.   No fevers/chills, significant dyspnea, chest pain.   HRCT of chest 10/2022 showed extensive bilateral apical nodular pleuro-parenchymal thickening thought due to benign postinfectious or post-inflammatory scarring, aortic ATH with CAD, no signs of ILD. Has seen pulm and cardiology. Next pulm appt [facility] is 07/2023, planned PFTs prior.   She saw dentist 06/02/2023 for routine cleaning - found cracked tooth s/p temporary crown. After this she developed sores to L tongue. Has been treating with dexamethasone steroid solution (swish and swallow) followed by chlorhexadine rinse.   GERD - she continues pantoprazole 40mg  BID, feels reflux symptoms well controlled with this.   She recently restarted PT for cervical neck pain.  Requests handicap placard application filled out.      Relevant past medical, surgical, family and social history reviewed and updated as indicated. Interim medical history since our last visit  reviewed. Allergies and medications reviewed and updated. Outpatient Medications Prior to Visit  Medication Sig Dispense Refill   ALPRAZolam (XANAX) 1 MG tablet Take 0.5 tablets (0.5 mg total) by mouth at bedtime. 30 tablet 0   amLODipine (NORVASC) 2.5 MG tablet TAKE 1 TABLET BY MOUTH DAILY 90 tablet 0   amLODipine (NORVASC) 5 MG tablet TAKE 1 TABLET BY MOUTH DAILY 90 tablet 0   amphetamine-dextroamphetamine (ADDERALL XR) 30 MG 24 hr capsule TAKE 1 CAPSULE BY MOUTH EVERY MORNING 90 capsule 0   ARIPiprazole (ABILIFY) 5 MG tablet Take 5 mg by mouth at bedtime.     Calcium Carbonate-Vitamin D (CALTRATE 600+D PO) Take 2 tablets by mouth daily.     clindamycin (CLEOCIN) 150 MG capsule Take 600mg  1 hour prior to dental procedure 10 capsule 0   cyanocobalamin (VITAMIN B12) 1000 MCG/ML injection INJECT INTO THE MUSCLE EVERY 21 DAYSAS DIRECTED 3 mL 3   dexamethasone (DECADRON) 0.5 MG/5ML solution Take by mouth.     Eszopiclone 3 MG TABS Take 1 tablet (3 mg total) by mouth at bedtime. Take immediately before bedtime 90 tablet 0   ferrous sulfate 325 (65 FE) MG tablet Take 1 tablet (325 mg total) by mouth every Monday, Wednesday, and Friday. 30 tablet 3   lamoTRIgine (LAMICTAL) 200 MG tablet Take 1 tablet (200 mg total) by mouth daily. 30 tablet 0   lidocaine (LIDODERM) 5 % Place 3 patches onto the skin daily. Remove & Discard patch within 12 hours or as directed by  MD 60 patch 0   lidocaine (XYLOCAINE) 5 % ointment Apply 1 Application topically as needed. Apply up to 4x/day- on LUE for nerve pain 50 g 6   linaclotide (LINZESS) 290 MCG CAPS capsule Take 1 capsule (290 mcg total) by mouth daily. 90 capsule 2   morphine (MSIR) 15 MG tablet TAKE ONE TABLET BY MOUTH EVERY 6 HOURS AS NEEDED FOR SEVERE PAIN 90 tablet 0   MOVANTIK 25 MG TABS tablet TAKE ONE TABLET BY MOUTH EVERY DAY 90 tablet 0   NONFORMULARY OR COMPOUNDED ITEM Testosterone proprionate petrolatum (jar) 2% ointment  2 Grams S: Apply a small  amount once daily x 5 days per week.  [facility]. 2 each 3   ondansetron (ZOFRAN) 4 MG tablet TAKE 1 TABLET BY MOUTH EVERY 8 HOURS AS NEEDED FOR NAUSEA AND VOMITING 30 tablet 0   pantoprazole (PROTONIX) 40 MG tablet TAKE ONE TABLET BY MOUTH TWICE DAILY WITH MEALS 180 tablet 0   polyethylene glycol (MIRALAX / GLYCOLAX) 17 g packet Take 17 g by mouth 2 (two) times daily.     Prasterone (INTRAROSA) 6.5 MG INST UNWRAP AND INSERT 1 SUPPOSITORY  VAGINALLY WITH PROVIDED  APPLICATOR AT BEDTIME 90 each 3   pregabalin (LYRICA) 150 MG capsule Take 150 mg by mouth 2 (two) times daily.     tiZANidine (ZANAFLEX) 2 MG tablet TAKE 1 TABLET BY MOUTH EVERY 8 HOURS AS NEEDED 30 tablet 0   ursodiol (ACTIGALL) 300 MG capsule TAKE 1 CAPSULE BY MOUTH TWICE DAILY 180 capsule 0   No facility-administered medications prior to visit.     Per HPI unless specifically indicated in ROS section below Review of Systems  Objective:  BP 134/74   Pulse 97   Temp 98.8 F (37.1 C) (Oral)   Ht 5' 3.25" (1.607 m)   Wt 116 lb 8 oz (52.8 kg)   LMP 07/25/1998   SpO2 95%   BMI 20.47 kg/m   Wt Readings from Last 3 Encounters:  06/20/23 116 lb 8 oz (52.8 kg)  05/08/23 116 lb (52.6 kg)  04/10/23 115 lb (52.2 kg)      Physical Exam Vitals and nursing note reviewed.  Constitutional:      Appearance: Normal appearance. She is not ill-appearing.  HENT:     Head: Normocephalic and atraumatic.     Right Ear: Tympanic membrane, ear canal and external ear normal. There is no impacted cerumen.     Left Ear: Tympanic membrane, ear canal and external ear normal. There is no impacted cerumen.     Mouth/Throat:     Mouth: Mucous membranes are moist.     Pharynx: No oropharyngeal exudate or posterior oropharyngeal erythema.     Comments: Sore to inferior L tongue Eyes:     Extraocular Movements: Extraocular movements intact.     Pupils: Pupils are equal, round, and reactive to light.  Cardiovascular:     Rate and  Rhythm: Normal rate and regular rhythm.     Pulses: Normal pulses.     Heart sounds: Normal heart sounds. No murmur heard. Pulmonary:     Effort: Pulmonary effort is normal. No respiratory distress.     Breath sounds: Rhonchi (bibasilar) present. No wheezing or rales.     Comments: Coarse bibasilarly Musculoskeletal:     Right lower leg: No edema.     Left lower leg: No edema.  Neurological:     Mental Status: She is alert.  Psychiatric:  Mood and Affect: Mood normal.        Behavior: Behavior normal.       Lab Results  Component Value Date   NA 134 (L) 09/26/2022   CL 98 09/26/2022   K 4.8 09/26/2022   CO2 29 09/26/2022   BUN 7 09/26/2022   CREATININE 0.48 09/26/2022   GFR 99.92 09/26/2022   CALCIUM 9.2 09/26/2022   ALBUMIN 4.1 09/26/2022   GLUCOSE 95 09/26/2022    Lab Results  Component Value Date   WBC 8.5 09/26/2022   HGB 12.5 09/26/2022   HCT 37.1 09/26/2022   MCV 84.4 09/26/2022   PLT 320.0 09/26/2022    Assessment & Plan:   Problem List Items Addressed This Visit     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HCC)   Chronic cough - Primary    Coarse breath sounds bibasilarly. Check CXR today. ?COPD given hyperinflation noted however she doesn't have significant smoking history. Does have second hand smoke exposure history growing up.  She does have increased cough with increased sputum production and purulence - will Rx prednisone + doxycycline course. Keep pulm f/u for 07/2023, update if not improved with above treatment.  ?due to hidden GERD, however she already regularly takes pantoprazole 40mg  BID with overall good effect.       Relevant Orders   DG Chest 2 View     Meds ordered this encounter  Medications   predniSONE (DELTASONE) 20 MG tablet    Sig: Take two tablets daily for 3 days followed by one tablet daily for 3 days    Dispense:  9 tablet    Refill:  0   doxycycline (VIBRA-TABS) 100 MG tablet    Sig: Take 1 tablet (100 mg total) by mouth 2 (two)  times daily.    Dispense:  14 tablet    Refill:  0    Orders Placed This Encounter  Procedures   DG Chest 2 View    Standing Status:   Future    Number of Occurrences:   1    Standing Expiration Date:   06/19/2024    Order Specific Question:   Reason for Exam (SYMPTOM  OR DIAGNOSIS REQUIRED)    Answer:   chronic cough with bibasilar crackles    Order Specific Question:   Preferred imaging location?    Answer:   [facility]    Patient Instructions  Xray today  I do want to you to start prednisone taper sent to pharmacy along with doxycycline antibiotic.  Let us know if not improving with this.   Follow up plan: No follow-ups on file.  Eustaquio Boyden, MD

## 2023-06-20 NOTE — Assessment & Plan Note (Addendum)
Coarse breath sounds bibasilarly. Check CXR today. ?COPD given hyperinflation noted however she doesn't have significant smoking history. Does have second hand smoke exposure history growing up.  She does have increased cough with increased sputum production and purulence - will Rx prednisone + doxycycline course. Keep pulm f/u for 07/2023, update if not improved with above treatment.  ?due to hidden GERD, however she already regularly takes pantoprazole 40mg  BID with overall good effect.

## 2023-06-20 NOTE — Telephone Encounter (Signed)
ERx 

## 2023-06-21 ENCOUNTER — Other Ambulatory Visit: Payer: Self-pay | Admitting: Family Medicine

## 2023-06-21 ENCOUNTER — Encounter: Payer: Self-pay | Admitting: Family Medicine

## 2023-06-21 MED ORDER — AMPHETAMINE-DEXTROAMPHET ER 30 MG PO CP24: 30.0000 mg | ORAL_CAPSULE | ORAL | 0 refills | Status: DC

## 2023-06-21 NOTE — Telephone Encounter (Signed)
This was addressed through [employer]

## 2023-06-21 NOTE — Telephone Encounter (Signed)
Ok to send to updated pharmacy? Do you want [place] to call and cancel first script

## 2023-06-21 NOTE — Telephone Encounter (Signed)
[facility]

## 2023-06-21 NOTE — Telephone Encounter (Signed)
I believe this is though psychiatry Dr Renne Crigler - she should contact their office  I sent pt a mychart message.

## 2023-06-21 NOTE — Telephone Encounter (Signed)
Pt called in and stated that the pharmacy her meditation was sent to the meditation is on on back order pt stated she found a pharmacy that has it but can only give the pt 30 day supply   [address]  [place] [address] [phone]

## 2023-06-21 NOTE — Telephone Encounter (Signed)
Pt called in stating the rx for ALPRAZolam Prudy Feeler) 1 MG tablet that want to [facility] - [place], [place] - [address]  [address], [place] [address], is now on back order. Pt is requesting a new rx be sent to [id number] - [place], [place] - [address]? Call back # [phone]

## 2023-06-21 NOTE — Addendum Note (Signed)
Addended by: Nanci Pina on: 06/21/2023 10:57 AM   Modules accepted: Orders

## 2023-06-29 ENCOUNTER — Other Ambulatory Visit: Payer: Self-pay | Admitting: Family Medicine

## 2023-06-29 DIAGNOSIS — G894 Chronic pain syndrome: Secondary | ICD-10-CM

## 2023-06-29 NOTE — Telephone Encounter (Signed)
ERx 

## 2023-06-30 ENCOUNTER — Ambulatory Visit: Payer: Medicare Other | Admitting: Physical Therapy

## 2023-07-05 ENCOUNTER — Ambulatory Visit: Payer: Medicare Other | Admitting: Physical Therapy

## 2023-07-05 ENCOUNTER — Ambulatory Visit: Payer: Medicare Other | Admitting: Pulmonary Disease

## 2023-07-10 ENCOUNTER — Encounter: Payer: Self-pay | Admitting: Family Medicine

## 2023-07-10 ENCOUNTER — Ambulatory Visit (INDEPENDENT_AMBULATORY_CARE_PROVIDER_SITE_OTHER): Payer: Medicare Other | Admitting: Family Medicine

## 2023-07-10 VITALS — BP 122/74 | HR 97 | Temp 98.2°F | Ht 63.25 in | Wt 117.2 lb

## 2023-07-10 DIAGNOSIS — M5010 Cervical disc disorder with radiculopathy, unspecified cervical region: Secondary | ICD-10-CM

## 2023-07-10 DIAGNOSIS — G9332 Myalgic encephalomyelitis/chronic fatigue syndrome: Secondary | ICD-10-CM | POA: Diagnosis not present

## 2023-07-10 DIAGNOSIS — G8929 Other chronic pain: Secondary | ICD-10-CM

## 2023-07-10 MED ORDER — AMPHETAMINE-DEXTROAMPHET ER 30 MG PO CP24: 30.0000 mg | ORAL_CAPSULE | ORAL | 0 refills | Status: DC

## 2023-07-10 NOTE — Assessment & Plan Note (Signed)
Refill adderall XR - has had trouble finding due to [employer]. Rx printed today.

## 2023-07-10 NOTE — Patient Instructions (Signed)
Good to see you today Adderall refilled today  Return as needed or in 3 months for follow up visit

## 2023-07-10 NOTE — Progress Notes (Signed)
Ph: [phone] Fax: [phone]   Patient ID: Rachel Duran, female    DOB: 08/15/1955, 67 y.o.   MRN: [id number]  This visit was conducted in person.  BP 122/74   Pulse 97   Temp 98.2 F (36.8 C) (Oral)   Ht 5' 3.25" (1.607 m)   Wt 117 lb 4 oz (53.2 kg)   LMP 07/25/1998   SpO2 96%   BMI 20.61 kg/m    CC: 3 mo chronic pain management visit  Subjective:   HPI: Rachel Duran is a 67 y.o. female presenting on 07/10/2023 for Medical Management of Chronic Issues (Here for 3 mo pain mgmt f/u.)   Cares for 3 grandchildren, one with autism.  Continues seeing [facility] counselor after daughter's death (about a year).   Chronic pain on MSIR 15mg  Q6 hours PRN (normally BID), lyrica 150mg  BID, lidocaine patches 1 patch nightly. H/o multiple spine surgeries with residual pain, latest displaced clavicle followed by cervical spine fracture ()C5/6 fracture subluxation s/p repair after fall. H/o prior cervical spine fusion. Notes ongoing neck pain/stiffness - with significant pain. Had to postpone PT start through neuro PT in [place] due to lack of time.  Sees [facility] for cervical radiculopathy.    Chronic fatigue on adderall XR 30mg  daily. Tolerates this well.   Known limited systemic sclerosis, OA, raynaud's and fibromyalgia followed by rheumatology, latest seen 01/2023. 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   Prednisone and doxy course cleared previous chronic cough (see prior note).      Relevant past medical, surgical, family and social history reviewed and updated as indicated. Interim medical history since our last visit reviewed. Allergies and medications reviewed and updated. Outpatient Medications Prior to Visit  Medication Sig Dispense Refill   ALPRAZolam (XANAX) 1 MG tablet Take 0.5 tablets (0.5 mg total) by mouth at bedtime. 30 tablet 0   amLODipine (NORVASC) 2.5 MG tablet TAKE 1 TABLET BY MOUTH DAILY 90 tablet 0   amLODipine (NORVASC) 5 MG tablet  TAKE 1 TABLET BY MOUTH DAILY 90 tablet 0   amphetamine-dextroamphetamine (ADDERALL XR) 30 MG 24 hr capsule TAKE 1 CAPSULE BY MOUTH EVERY MORNING 90 capsule 0   ARIPiprazole (ABILIFY) 5 MG tablet Take 5 mg by mouth at bedtime.     Calcium Carbonate-Vitamin D (CALTRATE 600+D PO) Take 2 tablets by mouth daily.     clindamycin (CLEOCIN) 150 MG capsule Take 600mg  1 hour prior to dental procedure 10 capsule 0   cyanocobalamin (VITAMIN B12) 1000 MCG/ML injection INJECT INTO THE MUSCLE EVERY 21 DAYSAS DIRECTED 4 mL 3   dexamethasone (DECADRON) 0.5 MG/5ML solution Take by mouth.     Eszopiclone 3 MG TABS Take 1 tablet (3 mg total) by mouth at bedtime. Take immediately before bedtime 90 tablet 0   ferrous sulfate 325 (65 FE) MG tablet Take 1 tablet (325 mg total) by mouth every Monday, Wednesday, and Friday. 30 tablet 3   lamoTRIgine (LAMICTAL) 200 MG tablet Take 1 tablet (200 mg total) by mouth daily. 30 tablet 0   lidocaine (LIDODERM) 5 % Place 3 patches onto the skin daily. Remove & Discard patch within 12 hours or as directed by MD 60 patch 0   lidocaine (XYLOCAINE) 5 % ointment Apply 1 Application topically as needed. Apply up to 4x/day- on LUE for nerve pain 50 g 6   linaclotide (LINZESS) 290 MCG CAPS capsule Take 1 capsule (290 mcg total) by mouth daily. 90 capsule 2   morphine (  MSIR) 15 MG tablet TAKE ONE TABLET BY MOUTH EVERY 6 HOURS AS NEEDED FOR SEVERE PAIN 90 tablet 0   MOVANTIK 25 MG TABS tablet TAKE ONE TABLET BY MOUTH EVERY DAY 90 tablet 0   NONFORMULARY OR COMPOUNDED ITEM Testosterone proprionate petrolatum (jar) 2% ointment  2 Grams S: Apply a small amount once daily x 5 days per week.  [facility]. 2 each 3   ondansetron (ZOFRAN) 4 MG tablet TAKE 1 TABLET BY MOUTH EVERY 8 HOURS AS NEEDED FOR NAUSEA AND VOMITING 30 tablet 0   pantoprazole (PROTONIX) 40 MG tablet TAKE ONE TABLET BY MOUTH TWICE DAILY WITH MEALS 180 tablet 0   polyethylene glycol (MIRALAX / GLYCOLAX) 17 g packet  Take 17 g by mouth 2 (two) times daily.     Prasterone (INTRAROSA) 6.5 MG INST UNWRAP AND INSERT 1 SUPPOSITORY  VAGINALLY WITH PROVIDED  APPLICATOR AT BEDTIME 90 each 3   pregabalin (LYRICA) 150 MG capsule Take 150 mg by mouth 2 (two) times daily.     tiZANidine (ZANAFLEX) 2 MG tablet TAKE 1 TABLET BY MOUTH EVERY 8 HOURS AS NEEDED 30 tablet 0   ursodiol (ACTIGALL) 300 MG capsule TAKE 1 CAPSULE BY MOUTH TWICE DAILY 180 capsule 0   amphetamine-dextroamphetamine (ADDERALL XR) 30 MG 24 hr capsule Take 1 capsule (30 mg total) by mouth every morning. 30 capsule 0   doxycycline (VIBRA-TABS) 100 MG tablet Take 1 tablet (100 mg total) by mouth 2 (two) times daily. 14 tablet 0   predniSONE (DELTASONE) 20 MG tablet Take two tablets daily for 3 days followed by one tablet daily for 3 days 9 tablet 0   No facility-administered medications prior to visit.     Per HPI unless specifically indicated in ROS section below Review of Systems  Objective:  BP 122/74   Pulse 97   Temp 98.2 F (36.8 C) (Oral)   Ht 5' 3.25" (1.607 m)   Wt 117 lb 4 oz (53.2 kg)   LMP 07/25/1998   SpO2 96%   BMI 20.61 kg/m   Wt Readings from Last 3 Encounters:  07/10/23 117 lb 4 oz (53.2 kg)  06/20/23 116 lb 8 oz (52.8 kg)  05/08/23 116 lb (52.6 kg)      Physical Exam Vitals and nursing note reviewed.  Constitutional:      Appearance: Normal appearance. She is not ill-appearing.  HENT:     Head: Normocephalic and atraumatic.     Mouth/Throat:     Mouth: Mucous membranes are moist.     Pharynx: Oropharynx is clear. No oropharyngeal exudate.  Eyes:     Extraocular Movements: Extraocular movements intact.  Cardiovascular:     Rate and Rhythm: Normal rate and regular rhythm.     Pulses: Normal pulses.     Heart sounds: Normal heart sounds. No murmur heard. Pulmonary:     Effort: Pulmonary effort is normal. No respiratory distress.     Breath sounds: Normal breath sounds. No wheezing, rhonchi or rales.   Musculoskeletal:     Right lower leg: No edema.     Left lower leg: No edema.     Comments:  Scoliosis present Painful nodules to DIPs bilateral hands  Skin:    General: Skin is warm and dry.     Findings: No rash.  Neurological:     Mental Status: She is alert.        Assessment & Plan:   Problem List Items Addressed This Visit  Encounter for chronic pain management - Primary (Chronic)   [place] CSRS reviewed.  Tolerating pain regimen well - continue this.       Cervical disc disorder with radiculopathy of cervical region   Relevant Medications   amphetamine-dextroamphetamine (ADDERALL XR) 30 MG 24 hr capsule   CFS (chronic fatigue syndrome)   Refill adderall XR - has had trouble finding due to nationwide shortage. Rx printed today.       Relevant Medications   amphetamine-dextroamphetamine (ADDERALL XR) 30 MG 24 hr capsule     Meds ordered this encounter  Medications   amphetamine-dextroamphetamine (ADDERALL XR) 30 MG 24 hr capsule    Sig: Take 1 capsule (30 mg total) by mouth every morning.    Dispense:  30 capsule    Refill:  0    No orders of the defined types were placed in this encounter.   Patient Instructions  Good to see you today Adderall refilled today  Return as needed or in 3 months for follow up visit   Follow up plan: Return in about 3 months (around 10/08/2023) for annual exam, prior fasting for blood work.  Eustaquio Boyden, MD

## 2023-07-10 NOTE — Assessment & Plan Note (Signed)
[place] CSRS reviewed.  Tolerating pain regimen well - continue this.

## 2023-07-12 ENCOUNTER — Ambulatory Visit: Payer: Medicare Other | Admitting: Physical Therapy

## 2023-07-18 ENCOUNTER — Encounter: Payer: Self-pay | Admitting: Family Medicine

## 2023-07-21 ENCOUNTER — Other Ambulatory Visit: Payer: Self-pay | Admitting: Family Medicine

## 2023-07-21 DIAGNOSIS — G4700 Insomnia, unspecified: Secondary | ICD-10-CM

## 2023-07-23 NOTE — Telephone Encounter (Signed)
ERx 

## 2023-07-24 ENCOUNTER — Encounter: Payer: Self-pay | Admitting: Family Medicine

## 2023-07-26 DIAGNOSIS — M810 Age-related osteoporosis without current pathological fracture: Secondary | ICD-10-CM

## 2023-07-26 HISTORY — DX: Age-related osteoporosis without current pathological fracture: M81.0

## 2023-07-26 HISTORY — DX: Other disorders of phosphorus metabolism: E83.39

## 2023-07-27 ENCOUNTER — Ambulatory Visit: Payer: Medicare Other | Admitting: Rheumatology

## 2023-07-30 ENCOUNTER — Encounter: Payer: Self-pay | Admitting: *Deleted

## 2023-07-30 ENCOUNTER — Other Ambulatory Visit: Payer: Self-pay

## 2023-07-30 ENCOUNTER — Ambulatory Visit
Admission: EM | Admit: 2023-07-30 | Discharge: 2023-07-30 | Disposition: A | Discharge status: Home / Self Care | Payer: Medicare Other | Attending: Emergency Medicine | Admitting: Emergency Medicine

## 2023-07-30 DIAGNOSIS — J069 Acute upper respiratory infection, unspecified: Secondary | ICD-10-CM | POA: Diagnosis not present

## 2023-07-30 LAB — POCT RAPID STREP A (OFFICE): Rapid Strep A Screen: NEGATIVE

## 2023-07-30 LAB — POC COVID19/FLU A&B COMBO
Covid Antigen, POC: NEGATIVE
Influenza A Antigen, POC: NEGATIVE
Influenza B Antigen, POC: NEGATIVE

## 2023-07-30 MED ORDER — AMOXICILLIN-POT CLAVULANATE 875-125 MG PO TABS: 1.0000 | ORAL_TABLET | Freq: Two times a day (BID) | ORAL | 0 refills | Status: DC

## 2023-07-30 MED ORDER — PREDNISONE 10 MG (21) PO TBPK: ORAL_TABLET | Freq: Every day | ORAL | 0 refills | Status: DC

## 2023-07-30 MED ORDER — LIDOCAINE VISCOUS HCL 2 % MT SOLN: 15.0000 mL | OROMUCOSAL | 0 refills | Status: DC | PRN

## 2023-07-30 NOTE — Discharge Instructions (Signed)
 Your symptoms today are most likely being caused by a virus and should steadily improve in time it can take up to 7 to 10 days before you truly start to see a turnaround however things will get better, if no improvement seen by Friday may pick up Augmentin  from the pharmacy and begin use  In the meantime take prednisone  every morning with food to help with pain, may use Tylenol  additionally  May gargle and spit lidocaine  solution to temporarily provide relief to your throat    You can take Tylenol  and/or Ibuprofen as needed for fever reduction and pain relief.   For cough: honey 1/2 to 1 teaspoon (you can dilute the honey in water  or another fluid).  You can also use guaifenesin and dextromethorphan for cough. You can use a humidifier for chest congestion and cough.  If you don't have a humidifier, you can sit in the bathroom with the hot shower running.      For sore throat: try warm salt water  gargles, cepacol lozenges, throat spray, warm tea or water  with lemon/honey, popsicles or ice, or OTC cold relief medicine for throat discomfort.   For congestion: take a daily anti-histamine like Zyrtec, Claritin, and a oral decongestant, such as pseudoephedrine.  You can also use Flonase  1-2 sprays in each nostril daily.   It is important to stay hydrated: drink plenty of fluids (water , gatorade/powerade/pedialyte, juices, or teas) to keep your throat moisturized and help further relieve irritation/discomfort.

## 2023-07-30 NOTE — ED Provider Notes (Signed)
 CAY RALPH PELT    CSN: [id number] Arrival date & time: 07/30/23  1115      History   Chief Complaint Chief Complaint  Patient presents with   Sore Throat   Otalgia    HPI Rachel Duran is a 68 y.o. female.   Patient presents for evaluation of increased fatigue, nasal congestion, sore throat, primarily nonproductive cough, intermittent headaches, sinus pressure along the bilateral cheeks present for 2 to 3 days.  Has attempted use of warm salt rinses and throat lozenges.  Known sick contacts by grandchildren.  Poor appetite due to pain with swallowing.  Denies fever, shortness of breath or wheezing.  Past Medical History:  Diagnosis Date   Abdominal pain last 4 months   and nausea also   Allergy    Anemia    history of   Anxiety    Bipolar disorder (HCC)    atpical bipolar disorder   Cervical disc disease limited rom turning to left   hx. C6- C7 -hx. past fusion(bone graft used)   Cholecystitis    Chronic pain    DDD (degenerative disc disease), lumbar 09/2015   dextroscoliosis with multilevel DDD and facet arthrosis most notable for R foraminal disc protrusion L4/5 producing severe R neural foraminal stenosis abutting R L4 nerve root, moderate spinal canal and mild lat recesss and R neural foraminal stenosis L3/4 (MRI)   Depression    bipolar depression   Disorders of porphyrin metabolism    Felon of finger of left hand 11/10/2016   Fibromyalgia    GERD (gastroesophageal reflux disease)    Headache    occasionally   Internal hemorrhoids    Interstitial cystitis 06/06/2012   hx.   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 (Dr Jarold retinologist)   Tuberculosis     Patient Active Problem List   Diagnosis Date Noted   Chronic cough 06/20/2023   Onycholysis 04/10/2023   Grieving 06/27/2022   Elevated blood pressure reading in office without diagnosis of hypertension 05/31/2022   Closed nondisplaced fracture of lateral end of left clavicle 05/30/2022   Fusion of spine, cervical region    Cervical spine arthritis with nerve pain 12/29/2021   C6 cervical fracture (HCC) 12/29/2021   C5 cervical fracture (HCC) 12/21/2021   Advanced directives, counseling/discussion 09/23/2021   Swelling of right index finger 07/14/2021   Synovitis of right knee    Scoliosis 01/13/2021   Osteopenia 07/02/2020   Status post left knee surgery 06/01/2020   Pulmonary nodule 05/27/2020   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   Chronic patellofemoral pain of right knee 05/05/2020   Positive ANA (antinuclear antibody) 06/05/2019   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   Unilateral primary osteoarthritis, left hip 01/28/2019   Common bile duct (CBD) obstruction    Venous insufficiency of left lower extremity 07/04/2018   Dyslipidemia 09/22/2017   DDD (degenerative disc disease), cervical 04/03/2017   DDD (degenerative disc disease), lumbar 04/03/2017  Onychomycosis 03/21/2017   Dysphagia 10/18/2016   Encounter for chronic pain management 10/18/2016   Biliary stasis 09/26/2016   Dry mouth 06/06/2016   HNP (herniated nucleus pulposus), lumbar 09/23/2015   Iron deficiency 07/30/2015   Health maintenance examination 06/15/2015   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   Clavicle enlargement 12/13/2014   Prediabetes 12/13/2014   Plantar fasciitis, bilateral 09/11/2014   Medicare annual wellness visit, subsequent 06/10/2014   Abnormal thyroid  function test 06/10/2014   Chronic pain syndrome 06/10/2014   CFS  (chronic fatigue syndrome) 04/08/2014   Postmenopausal atrophic vaginitis 10/19/2012   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   Cervical disc disorder with radiculopathy of cervical region 05/28/2010   APHTHOUS ULCERS 01/31/2008   Insomnia 11/09/2007   Constipation 10/11/2007   Allergic rhinitis 04/12/2007   MDD (major depressive disorder), recurrent episode, moderate (HCC) 03/05/2007   [name] syndrome 03/05/2007   GERD 03/05/2007   ROSACEA 03/05/2007   NEURALGIA 03/05/2007   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   Chronic interstitial cystitis 12/06/2006   Fibromyalgia 12/06/2006    Past Surgical History:  Procedure Laterality Date   ANTERIOR CERVICAL DECOMP/DISCECTOMY FUSION  2004   C5/6, C6/7   ANTERIOR CERVICAL DECOMP/DISCECTOMY FUSION  02/2016   C3/4, C4/5 with plating Gavin)   ANTERIOR CERVICAL DECOMP/DISCECTOMY FUSION  12/29/2021   Procedure: ANTERIOR CERVICAL DECOMPRESSION/ DISCECTOMY FUSION CERVICAL THREE - SEVEN;  Surgeon: Mavis Purchase, MD;  Location: [facility] OR;  Service: Neurosurgery;;   ANTERIOR CERVICAL DECOMPRESSION/DISCECTOMY FUSION 4 LEVELS N/A 12/29/2021   Procedure: CERVICAL FIVE-SIX CORPECTOMY,;  Surgeon: Mavis Purchase, MD;  Location: [facility] OR;  Service: Neurosurgery;  Laterality: N/A;   AUGMENTATION MAMMAPLASTY Bilateral 03/25/2010   BREAST ENHANCEMENT SURGERY  2010   BREAST IMPLANT EXCHANGE  10/2014   exchange saline implants, B mastopexy/capsulorraphy ([facility])   BUNIONECTOMY Bilateral yrs ago   CESAREAN SECTION  1985   x 1   CHOLECYSTECTOMY  06/11/2012   Procedure: LAPAROSCOPIC CHOLECYSTECTOMY WITH INTRAOPERATIVE CHOLANGIOGRAM;  Surgeon: Camellia CHRISTELLA Blush, MD,FACS;  Location: [facility];  Service: General;  Laterality: N/A;   COLONOSCOPY  02/2018   done for positive cologuard - WNL, rpt 10 yrs Oma)   CYSTOSCOPY     ERCP  05/22/2012   Procedure: ENDOSCOPIC RETROGRADE CHOLANGIOPANCREATOGRAPHY (ERCP);  Surgeon: Gwendlyn ONEIDA Buddy,  MD,FACG;  Location: [facility];  Service: Endoscopy;  Laterality: N/A;   ERCP N/A 09/17/2013   Procedure: ENDOSCOPIC RETROGRADE CHOLANGIOPANCREATOGRAPHY (ERCP);  Surgeon: Gwendlyn ONEIDA Buddy, MD;  Location: [facility];  Service: Endoscopy;  Laterality: N/A;   ERCP N/A 09/27/2018   Procedure: ENDOSCOPIC RETROGRADE CHOLANGIOPANCREATOGRAPHY (ERCP);  Surgeon: Buddy Gwendlyn ONEIDA, MD;  Location: [facility];  Service: Endoscopy;  Laterality: N/A;   ESOPHAGOGASTRODUODENOSCOPY  09/2016   WNL. esophagus dilated Oma)   HEMORRHOID BANDING  09/23/2013   --Dr. Camellia Blush   HERNIA REPAIR     inguinal   HYSTEROSCOPY W/ ENDOMETRIAL ABLATION     KNEE ARTHROSCOPY Right 04/06/2021   Procedure: RIGHT KNEE ARTHROSCOPY WITH DEBRIDEMENT;  Surgeon: Addie Cordella Hamilton, MD;  Location: [facility];  Service: Orthopedics;  Laterality: Right;   NASAL SINUS SURGERY     x5   PARTIAL KNEE ARTHROPLASTY Right 06/01/2020   Procedure: Right knee patellofemoral replacement;  Surgeon: Addie Cordella Hamilton, MD;  Location: [facility];  Service: Orthopedics;  Laterality: Right;   POSTERIOR CERVICAL FUSION/FORAMINOTOMY N/A 12/29/2021   Procedure: POSTERIOR CERVICAL FUSION CERVICAL THREE-SEVEN;  Surgeon: Mavis Purchase, MD;  Location: [facility] OR;  Service:  Neurosurgery;  Laterality: N/A;   REMOVAL OF STONES  09/27/2018   Procedure: REMOVAL OF STONES;  Surgeon: Aneita Gwendlyn DASEN, MD;  Location: [facility];  Service: Endoscopy;;   SPHINCTEROTOMY  09/27/2018   Procedure: SPHINCTEROTOMY;  Surgeon: Aneita Gwendlyn DASEN, MD;  Location: [facility];  Service: Endoscopy;;   TONSILLECTOMY     removed as a child   TOTAL HIP ARTHROPLASTY Left 03/12/2019   Procedure: LEFT TOTAL HIP ARTHROPLASTY ANTERIOR APPROACH;  Jan, Lonni GRADE, MD)   UPPER GASTROINTESTINAL ENDOSCOPY      OB History     Gravida  2   Para  2   Term  2   Preterm      AB      Living  2      SAB      IAB      Ectopic       Multiple      Live Births               Home Medications    Prior to Admission medications   Medication Sig Start Date End Date Taking? Authorizing Provider  ALPRAZolam  (XANAX ) 1 MG tablet Take 0.5 tablets (0.5 mg total) by mouth at bedtime. 01/14/22  Yes Angiulli, Toribio PARAS, PA-C  amLODipine  (NORVASC ) 2.5 MG tablet TAKE 1 TABLET BY MOUTH DAILY 05/02/23  Yes Cheryl Waddell HERO, PA-C  amLODipine  (NORVASC ) 5 MG tablet TAKE 1 TABLET BY MOUTH DAILY 06/13/23  Yes Cheryl Waddell HERO, PA-C  amoxicillin -clavulanate (AUGMENTIN ) 875-125 MG tablet Take 1 tablet by mouth every 12 (twelve) hours. 08/04/23  Yes Adir Schicker, Shelba SAUNDERS, NP  amphetamine -dextroamphetamine  (ADDERALL  XR) 30 MG 24 hr capsule Take 1 capsule (30 mg total) by mouth every morning. 07/10/23  Yes Rilla Baller, MD  ARIPiprazole  (ABILIFY ) 5 MG tablet Take 5 mg by mouth at bedtime.   Yes [provider]  Calcium Carbonate-Vitamin D  (CALTRATE 600+D PO) Take 2 tablets by mouth daily.   Yes [provider]  cyanocobalamin  (VITAMIN B12) 1000 MCG/ML injection INJECT 1ML INTO THE MUSCLE EVERY 21 DAYSAS DIRECTED 06/20/23  Yes Rilla Baller, MD  Eszopiclone  3 MG TABS TAKE 1 TABLET BY MOUTH AT BEDTIME (TAKE IMMEDIATLEY BEFORE BEDTIME) 07/23/23  Yes Rilla Baller, MD  ferrous sulfate  325 (65 FE) MG tablet Take 1 tablet (325 mg total) by mouth every Monday, Wednesday, and Friday. 01/14/22  Yes Angiulli, Toribio PARAS, PA-C  lamoTRIgine  (LAMICTAL ) 200 MG tablet Take 1 tablet (200 mg total) by mouth daily. 01/14/22  Yes Angiulli, Toribio PARAS, PA-C  lidocaine  (LIDODERM ) 5 % Place 3 patches onto the skin daily. Remove & Discard patch within 12 hours or as directed by MD 01/15/22  Yes Angiulli, Toribio PARAS, PA-C  lidocaine  (XYLOCAINE ) 2 % solution Use as directed 15 mLs in the mouth or throat every 4 (four) hours as needed for mouth pain. 07/30/23  Yes Kenndra Morris R, NP  lidocaine  (XYLOCAINE ) 5 % ointment Apply 1 Application topically as needed.  Apply up to 4x/day- on LUE for nerve pain 12/27/22  Yes Rilla Baller, MD  linaclotide  (LINZESS ) 290 MCG CAPS capsule Take 1 capsule (290 mcg total) by mouth daily. 04/10/23  Yes Rilla Baller, MD  morphine  (MSIR) 15 MG tablet TAKE ONE TABLET BY MOUTH EVERY 6 HOURS AS NEEDED FOR SEVERE PAIN 06/29/23  Yes Rilla Baller, MD  MOVANTIK  25 MG TABS tablet TAKE ONE TABLET BY MOUTH EVERY DAY 03/07/23  Yes Rilla Baller, MD  pantoprazole  (PROTONIX ) 40 MG tablet TAKE  ONE TABLET BY MOUTH TWICE DAILY WITH MEALS 06/13/23  Yes Aneita Gwendlyn DASEN, MD  Prasterone  (INTRAROSA ) 6.5 MG INST UNWRAP AND INSERT 1 SUPPOSITORY  VAGINALLY WITH PROVIDED  APPLICATOR AT BEDTIME 05/08/23  Yes Amundson JAYSON Nikki Bobie FORBES, MD  predniSONE  (STERAPRED UNI-PAK 21 TAB) 10 MG (21) TBPK tablet Take by mouth daily. Take 6 tabs by mouth daily  for 1 days, then 5 tabs for 1 days, then 4 tabs for 1 days, then 3 tabs for 1 days, 2 tabs for 1 days, then 1 tab by mouth daily for 1 days 07/30/23  Yes Syria Kestner R, NP  pregabalin  (LYRICA ) 150 MG capsule Take 150 mg by mouth 2 (two) times daily.   Yes [provider]  ursodiol  (ACTIGALL ) 300 MG capsule TAKE 1 CAPSULE BY MOUTH TWICE DAILY 05/02/23  Yes Aneita Gwendlyn DASEN, MD  amphetamine -dextroamphetamine  (ADDERALL  XR) 30 MG 24 hr capsule TAKE 1 CAPSULE BY MOUTH EVERY MORNING 06/20/23   Rilla Baller, MD  clindamycin  (CLEOCIN ) 150 MG capsule Take 600mg  1 hour prior to dental procedure 06/15/23   Addie Cordella Hamilton, MD  dexamethasone  (DECADRON ) 0.5 MG/5ML solution Take by mouth. 06/05/23   [provider]  NONFORMULARY OR COMPOUNDED ITEM Testosterone  proprionate petrolatum (jar) 2% ointment  2 Grams S: Apply a small amount once daily x 5 days per week.  [facility]. 05/08/23   Cathlyn JAYSON Nikki Bobie FORBES, MD  ondansetron  (ZOFRAN ) 4 MG tablet TAKE 1 TABLET BY MOUTH EVERY 8 HOURS AS NEEDED FOR NAUSEA AND VOMITING 06/05/23   Rilla Baller, MD  polyethylene glycol  (MIRALAX  / GLYCOLAX ) 17 g packet Take 17 g by mouth 2 (two) times daily.    [provider]  tiZANidine  (ZANAFLEX ) 2 MG tablet TAKE 1 TABLET BY MOUTH EVERY 8 HOURS AS NEEDED 02/01/23   Rilla Baller, MD    Family History Family History  Problem Relation Age of Onset   CAD Father 56       MI, nonsmoker   Esophageal cancer Father 70   Stomach cancer Father    Scleroderma Mother    Hypertension Mother    Esophageal cancer Paternal Grandfather    Stomach cancer Paternal Grandfather    Diabetes Maternal Grandmother    Arthritis Brother    Stroke Neg Hx    Colon cancer Neg Hx    Rectal cancer Neg Hx     Social History Social History   Tobacco Use   Smoking status: Never    Passive exposure: Never   Smokeless tobacco: Never  Vaping Use   Vaping status: Never Used  Substance Use Topics   Alcohol use: No    Alcohol/week: 0.0 standard drinks of alcohol   Drug use: No     Allergies   Celebrex  [celecoxib ], Inh [isoniazid], Cymbalta  [duloxetine  hcl], Nucynta  [tapentadol ], Silenor  [doxepin  hcl], Benzoin, Duragesic -100 [fentanyl ], Keflex  [cephalexin ], Codeine phosphate, Demerol [meperidine hcl], Lithobid [lithium], and Sulfamethoxazole   Review of Systems Review of Systems   Physical Exam Triage Vital Signs ED Triage Vitals  Encounter Vitals Group     BP 07/30/23 1234 (!) 142/90     Systolic BP Percentile --      Diastolic BP Percentile --      Pulse Rate 07/30/23 1234 93     Resp 07/30/23 1234 18     Temp 07/30/23 1234 98.2 F (36.8 C)     Temp Source 07/30/23 1234 Oral     SpO2 07/30/23 1234 95 %  Weight --      Height --      Head Circumference --      Peak Flow --      Pain Score 07/30/23 1235 7     Pain Loc --      Pain Education --      Exclude from Growth Chart --    No data found.  Updated Vital Signs BP (!) 142/90   Pulse 93   Temp 98.2 F (36.8 C) (Oral)   Resp 18   LMP 07/25/1998   SpO2 95%   Visual Acuity Right Eye Distance:    Left Eye Distance:   Bilateral Distance:    Right Eye Near:   Left Eye Near:    Bilateral Near:     Physical Exam Constitutional:      Appearance: Normal appearance.  HENT:     Head: Normocephalic.     Right Ear: Tympanic membrane, ear canal and external ear normal.     Left Ear: Tympanic membrane, ear canal and external ear normal.     Nose: Congestion present. No rhinorrhea.     Mouth/Throat:     Pharynx: No oropharyngeal exudate or posterior oropharyngeal erythema.  Eyes:     Extraocular Movements: Extraocular movements intact.  Cardiovascular:     Rate and Rhythm: Normal rate and regular rhythm.     Pulses: Normal pulses.     Heart sounds: Normal heart sounds.  Pulmonary:     Effort: Pulmonary effort is normal.     Breath sounds: Normal breath sounds.  Musculoskeletal:     Cervical back: Normal range of motion and neck supple.  Neurological:     Mental Status: She is alert and oriented to person, place, and time. Mental status is at baseline.      UC Treatments / Results  Labs (all labs ordered are listed, but only abnormal results are displayed) Labs Reviewed  POCT RAPID STREP A (OFFICE)  POC COVID19/FLU A&B COMBO    EKG   Radiology No results found.  Procedures Procedures (including critical care time)  Medications Ordered in UC Medications - No data to display  Initial Impression / Assessment and Plan / UC Course  I have reviewed the triage vital signs and the nursing notes.  Pertinent labs & imaging results that were available during my care of the patient were reviewed by me and considered in my medical decision making (see chart for details).  Viral URI with cough  Patient is in no signs of distress nor toxic appearing.  Vital signs are stable.  Low suspicion for pneumonia, pneumothorax or bronchitis and therefore will defer imaging.  Strep test negative.  Prescribed prednisone  and viscous lidocaine , watch wait antibiotic placed at pharmacyMay  use additional over-the-counter medications as needed for supportive care.  May follow-up with urgent care as needed if symptoms persist or worsen.   Final Clinical Impressions(s) / UC Diagnoses   Final diagnoses:  Viral URI with cough     Discharge Instructions      Your symptoms today are most likely being caused by a virus and should steadily improve in time it can take up to 7 to 10 days before you truly start to see a turnaround however things will get better, if no improvement seen by Friday may pick up Augmentin  from the pharmacy and begin use  In the meantime take prednisone  every morning with food to help with pain, may use Tylenol  additionally  May gargle and spit  lidocaine  solution to temporarily provide relief to your throat    You can take Tylenol  and/or Ibuprofen as needed for fever reduction and pain relief.   For cough: honey 1/2 to 1 teaspoon (you can dilute the honey in water  or another fluid).  You can also use guaifenesin and dextromethorphan for cough. You can use a humidifier for chest congestion and cough.  If you don't have a humidifier, you can sit in the bathroom with the hot shower running.      For sore throat: try warm salt water  gargles, cepacol lozenges, throat spray, warm tea or water  with lemon/honey, popsicles or ice, or OTC cold relief medicine for throat discomfort.   For congestion: take a daily anti-histamine like Zyrtec, Claritin, and a oral decongestant, such as pseudoephedrine.  You can also use Flonase  1-2 sprays in each nostril daily.   It is important to stay hydrated: drink plenty of fluids (water , gatorade/powerade/pedialyte, juices, or teas) to keep your throat moisturized and help further relieve irritation/discomfort.    ED Prescriptions     Medication Sig Dispense Auth. Provider   predniSONE  (STERAPRED UNI-PAK 21 TAB) 10 MG (21) TBPK tablet Take by mouth daily. Take 6 tabs by mouth daily  for 1 days, then 5 tabs for 1 days, then 4  tabs for 1 days, then 3 tabs for 1 days, 2 tabs for 1 days, then 1 tab by mouth daily for 1 days 21 tablet Donyae Kilner R, NP   lidocaine  (XYLOCAINE ) 2 % solution Use as directed 15 mLs in the mouth or throat every 4 (four) hours as needed for mouth pain. 100 mL Dollye Glasser R, NP   amoxicillin -clavulanate (AUGMENTIN ) 875-125 MG tablet Take 1 tablet by mouth every 12 (twelve) hours. 14 tablet Priya Matsen R, NP      PDMP not reviewed this encounter.   Teresa Shelba SAUNDERS, NP 07/30/23 1319

## 2023-07-30 NOTE — ED Triage Notes (Signed)
 C/O sore throat and bilat earache onset 3 days ago with worsening last night. Denies fevers. Occasionally has dry cough. States grandchildren (who live with pt) have all been ill -- one with strep. Has been doing warm salt water gargles.

## 2023-08-01 ENCOUNTER — Other Ambulatory Visit: Payer: Self-pay | Admitting: Physician Assistant

## 2023-08-01 NOTE — Telephone Encounter (Signed)
 Last Fill: 05/02/2023   Next Visit: 07/27/2023   Last Visit: 02/06/2023   Dx: [name] disease without gangrene    Current Dose per office note on 02/06/2023: amlodipine 7.5 mg daily    Okay to refill Amlodipine?

## 2023-08-02 ENCOUNTER — Telehealth: Payer: Self-pay | Admitting: Family Medicine

## 2023-08-02 NOTE — Telephone Encounter (Signed)
Signed and in Lisa's box.

## 2023-08-02 NOTE — Telephone Encounter (Signed)
 Patient husband came by and dropped off a handicapped placard form that needs to be completed. Placed in Dr. Timoteo Expose box up front. Thank you!

## 2023-08-02 NOTE — Telephone Encounter (Signed)
 Spoke with pt notifying her the form is ready to pick up. Pt expresses her thanks and will stop by tomorrow.   [Placed form at front office.]

## 2023-08-02 NOTE — Telephone Encounter (Signed)
 Placed form in Dr. Timoteo Expose box.

## 2023-08-03 NOTE — Telephone Encounter (Signed)
 Forms picked up

## 2023-08-04 ENCOUNTER — Other Ambulatory Visit (HOSPITAL_BASED_OUTPATIENT_CLINIC_OR_DEPARTMENT_OTHER): Payer: Self-pay

## 2023-08-04 ENCOUNTER — Encounter (HOSPITAL_BASED_OUTPATIENT_CLINIC_OR_DEPARTMENT_OTHER): Payer: Self-pay

## 2023-08-04 DIAGNOSIS — M349 Systemic sclerosis, unspecified: Secondary | ICD-10-CM

## 2023-08-07 ENCOUNTER — Encounter (HOSPITAL_BASED_OUTPATIENT_CLINIC_OR_DEPARTMENT_OTHER): Payer: Medicare Other

## 2023-08-07 ENCOUNTER — Ambulatory Visit: Payer: Medicare Other | Admitting: Pulmonary Disease

## 2023-08-09 ENCOUNTER — Ambulatory Visit: Payer: Medicare Other | Admitting: Physical Therapy

## 2023-08-10 ENCOUNTER — Other Ambulatory Visit: Payer: Self-pay | Admitting: Family Medicine

## 2023-08-10 DIAGNOSIS — G894 Chronic pain syndrome: Secondary | ICD-10-CM

## 2023-08-10 NOTE — Telephone Encounter (Signed)
ERx 

## 2023-08-10 NOTE — Telephone Encounter (Signed)
Name of Medication: MSIR Name of Pharmacy: [facility] Last Fill or Written Date and Quantity: 06/29/23, #90 Last Office Visit and Type:  07/10/23, 3 mo pain mgmt f/u  Next Office Visit and Type:  10/10/23, CPE Last Controlled Substance Agreement Date: 04/19/19 Last UDS: 04/19/19

## 2023-08-14 NOTE — Progress Notes (Signed)
Office Visit Note  Patient: Rachel Duran             Date of Birth: 1956/04/08           MRN: [id number]             PCP: Eustaquio Boyden, MD Referring: Eustaquio Boyden, MD Visit Date: 08/28/2023 Occupation: @GUAROCC @  Subjective:  Neck and back pain  History of Present Illness: Tracy Gerken is a 68 y.o. female with limited systemic sclerosis, osteoarthritis and degenerative disc disease.  She returns today after her last visit in July 2024.  She states she continues to have discomfort in her cervical region.  She is also having some discomfort in the upper thoracic region between the scapula.  The discomfort in her hands, knee joints and feet is manageable.  She has not noticed any joint swelling.  She denies any increased skin tightness.  She continues to have dry mouth and dry eyes.  She denies any history of shortness of breath or palpitations.  She continues to have [name] symptoms.  She continues to have intermittent cough which happens when she consumes something cold.  She has had a chest x-ray in April 2024 which showed some bronchiectasis.  She still has intermittent productive cough.  She has an appointment coming up with Dr. Isaiah Serge.  She has an appointment with the cardiologist December 2025.    Activities of Daily Living:  Patient reports morning stiffness for 2 hours.   Patient Reports nocturnal pain.  Difficulty dressing/grooming: Reports Difficulty climbing stairs: Reports Difficulty getting out of chair: Reports Difficulty using hands for taps, buttons, cutlery, and/or writing: Reports  Review of Systems  Constitutional:  Positive for fatigue.  HENT:  Positive for mouth sores and mouth dryness.   Eyes:  Positive for dryness.  Respiratory:  Negative for shortness of breath.   Cardiovascular:  Negative for chest pain and palpitations.  Gastrointestinal:  Positive for constipation. Negative for blood in stool and diarrhea.  Endocrine: Negative  for increased urination.  Genitourinary:  Negative for involuntary urination.  Musculoskeletal:  Positive for joint pain, gait problem, joint pain, muscle weakness, morning stiffness and muscle tenderness. Negative for joint swelling, myalgias and myalgias.  Skin:  Positive for color change and sensitivity to sunlight. Negative for rash and hair loss.  Allergic/Immunologic: Negative for susceptible to infections.  Neurological:  Negative for dizziness and headaches.  Hematological:  Negative for swollen glands.  Psychiatric/Behavioral:  Positive for sleep disturbance. Negative for depressed mood. The patient is not nervous/anxious.     PMFS History:  Patient Active Problem List   Diagnosis Date Noted   Chronic cough 06/20/2023   Onycholysis 04/10/2023   Grieving 06/27/2022   Elevated blood pressure reading in office without diagnosis of hypertension 05/31/2022   Closed nondisplaced fracture of lateral end of left clavicle 05/30/2022   Fusion of spine, cervical region    Cervical spine arthritis with nerve pain 12/29/2021   C6 cervical fracture (HCC) 12/29/2021   C5 cervical fracture (HCC) 12/21/2021   Advanced directives, counseling/discussion 09/23/2021   Swelling of right index finger 07/14/2021   Synovitis of right knee    Scoliosis 01/13/2021   Osteopenia 07/02/2020   Status post left knee surgery 06/01/2020   Pulmonary nodule 05/27/2020   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   Chronic patellofemoral pain of right knee 05/05/2020   Positive ANA (antinuclear antibody) 06/05/2019   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   Unilateral primary  osteoarthritis, left hip 01/28/2019   Common bile duct (CBD) obstruction    Venous insufficiency of left lower extremity 07/04/2018   Dyslipidemia 09/22/2017   DDD (degenerative disc disease), cervical 04/03/2017   DDD (degenerative disc disease), lumbar 04/03/2017   Onychomycosis 03/21/2017   Dysphagia 10/18/2016    Encounter for chronic pain management 10/18/2016   Biliary stasis 09/26/2016   Dry mouth 06/06/2016   HNP (herniated nucleus pulposus), lumbar 09/23/2015   Iron deficiency 07/30/2015   Health maintenance examination 06/15/2015   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   Clavicle enlargement 12/13/2014   Prediabetes 12/13/2014   Plantar fasciitis, bilateral 09/11/2014   Medicare annual wellness visit, subsequent 06/10/2014   Abnormal thyroid function test 06/10/2014   Chronic pain syndrome 06/10/2014   CFS (chronic fatigue syndrome) 04/08/2014   Postmenopausal atrophic vaginitis 10/19/2012   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   Cervical disc disorder with radiculopathy of cervical region 05/28/2010   APHTHOUS ULCERS 01/31/2008   Insomnia 11/09/2007   Constipation 10/11/2007   Allergic rhinitis 04/12/2007   MDD (major depressive disorder), recurrent episode, moderate (HCC) 03/05/2007   [name] syndrome 03/05/2007   GERD 03/05/2007   ROSACEA 03/05/2007   NEURALGIA 03/05/2007   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 C6- C7 -hx. past fusion(bone graft used)   Cholecystitis    Chronic pain    DDD (degenerative disc disease), lumbar 09/2015   dextroscoliosis with multilevel DDD and facet arthrosis most notable for R foraminal disc protrusion L4/5 producing severe R neural foraminal stenosis abutting R L4 nerve root, moderate spinal canal and mild lat recesss and R neural foraminal stenosis L3/4 (MRI)   Depression    bipolar depression   Disorders of porphyrin metabolism    Felon of finger of left hand 11/10/2016   Fibromyalgia    GERD  (gastroesophageal reflux disease)    Headache    occasionally   Internal hemorrhoids    Interstitial cystitis 06/06/2012   hx.   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   hereditary macular degeneration (Dr Allyne Gee retinologist)   Tuberculosis     Family History  Problem Relation Age of Onset   CAD Father 14       MI, nonsmoker   Esophageal cancer Father 66   Stomach cancer Father    Scleroderma Mother    Hypertension Mother    Esophageal cancer Paternal Grandfather    Stomach cancer Paternal Grandfather    Diabetes Maternal Grandmother    Arthritis Brother    Stroke Neg Hx    Colon cancer Neg Hx    Rectal cancer Neg Hx  Past Surgical History:  Procedure Laterality Date   ANTERIOR CERVICAL DECOMP/DISCECTOMY FUSION  2004   C5/6, C6/7   ANTERIOR CERVICAL DECOMP/DISCECTOMY FUSION  02/2016   C3/4, C4/5 with plating Lovell Sheehan)   ANTERIOR CERVICAL DECOMP/DISCECTOMY FUSION  12/29/2021   Procedure: ANTERIOR CERVICAL DECOMPRESSION/ DISCECTOMY FUSION CERVICAL THREE - SEVEN;  Surgeon: Tressie Stalker, MD;  Location: [facility] OR;  Service: Neurosurgery;;   ANTERIOR CERVICAL DECOMPRESSION/DISCECTOMY FUSION 4 LEVELS N/A 12/29/2021   Procedure: CERVICAL FIVE-SIX CORPECTOMY,;  Surgeon: Tressie Stalker, MD;  Location: [facility] OR;  Service: Neurosurgery;  Laterality: N/A;   AUGMENTATION MAMMAPLASTY Bilateral 03/25/2010   BREAST ENHANCEMENT SURGERY  2010   BREAST IMPLANT EXCHANGE  10/2014   exchange saline implants, B mastopexy/capsulorraphy ([facility])   BUNIONECTOMY Bilateral yrs ago   CESAREAN  SECTION  1985   x 1   CHOLECYSTECTOMY  06/11/2012   Procedure: LAPAROSCOPIC CHOLECYSTECTOMY WITH INTRAOPERATIVE CHOLANGIOGRAM;  Surgeon: Atilano Ina, MD,FACS;  Location: [facility] ORS;  Service: General;  Laterality: N/A;   COLONOSCOPY  02/2018   done for positive cologuard - WNL, rpt 10 yrs Russella Dar)   CYSTOSCOPY     ERCP  05/22/2012   Procedure: ENDOSCOPIC RETROGRADE CHOLANGIOPANCREATOGRAPHY (ERCP);  Surgeon: Meryl Dare, MD,FACG;  Location: [facility];  Service: Endoscopy;  Laterality: N/A;   ERCP N/A 09/17/2013   Procedure: ENDOSCOPIC RETROGRADE CHOLANGIOPANCREATOGRAPHY (ERCP);  Surgeon: Meryl Dare, MD;  Location: [facility];  Service: Endoscopy;  Laterality: N/A;   ERCP N/A 09/27/2018   Procedure: ENDOSCOPIC RETROGRADE CHOLANGIOPANCREATOGRAPHY (ERCP);  Surgeon: Meryl Dare, MD;  Location: [facility];  Service: Endoscopy;  Laterality: N/A;   ESOPHAGOGASTRODUODENOSCOPY  09/2016   WNL. esophagus dilated Russella Dar)   HEMORRHOID BANDING  09/23/2013   --Dr. Gaynelle Adu   HERNIA REPAIR     inguinal   HYSTEROSCOPY W/ ENDOMETRIAL ABLATION     KNEE ARTHROSCOPY Right 04/06/2021   Procedure: RIGHT KNEE ARTHROSCOPY WITH DEBRIDEMENT;  Surgeon: Cammy Copa, MD;  Location: [facility];  Service: Orthopedics;  Laterality: Right;   NASAL SINUS SURGERY     x5   PARTIAL KNEE ARTHROPLASTY Right 06/01/2020   Procedure: Right knee patellofemoral replacement;  Surgeon: Cammy Copa, MD;  Location: [facility];  Service: Orthopedics;  Laterality: Right;   POSTERIOR CERVICAL FUSION/FORAMINOTOMY N/A 12/29/2021   Procedure: POSTERIOR CERVICAL FUSION CERVICAL THREE-SEVEN;  Surgeon: Tressie Stalker, MD;  Location: [facility] OR;  Service: Neurosurgery;  Laterality: N/A;   REMOVAL OF STONES  09/27/2018   Procedure: REMOVAL OF STONES;  Surgeon: Meryl Dare, MD;  Location: [facility];  Service: Endoscopy;;   SPHINCTEROTOMY  09/27/2018   Procedure: SPHINCTEROTOMY;   Surgeon: Meryl Dare, MD;  Location: [facility];  Service: Endoscopy;;   TONSILLECTOMY     removed as a child   TOTAL HIP ARTHROPLASTY Left 03/12/2019   Procedure: LEFT TOTAL HIP ARTHROPLASTY ANTERIOR APPROACH;  Magnus Ivan, Vanita Panda, MD)   UPPER GASTROINTESTINAL ENDOSCOPY     Social History   Social History Narrative   Lives with husband and dog   Caring for 3 grandchildren after daughter Cala Bradford) was killed 05/2022.   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   Activity: tries to walk dog (45 min 3x/wk), yoga   Diet: good water, fruits/vegetables daily      Rheum: Aairah Negrette   Psychiatrist: Evelene Croon   Surgery: Andrey Campanile   GIRussella Dar   Urology: Logan Bores   Immunization History  Administered Date(s) Administered  Fluad Trivalent(High Dose 65+) 04/10/2023   Influenza Split 05/04/2011, 04/04/2012   Influenza Whole 05/25/2007, 04/28/2008, 04/30/2009, 03/26/2010   Influenza,inj,Quad PF,6+ Mos 04/16/2015, 04/15/2016, 05/16/2017, 04/10/2018, 04/19/2019, 04/07/2020, 04/20/2022   Influenza-Unspecified 03/25/2014   MMR 11/28/2017   Moderna Sars-Covid-2 Vaccination 09/02/2019, 09/30/2019, 04/07/2020   Pfizer(Comirnaty)Fall Seasonal Vaccine 12 years and older 05/25/2022   Pneumococcal Conjugate-13 08/02/2013   Pneumococcal Polysaccharide-23 07/30/2009   Td 11/01/2005, 11/10/2016   Zoster Recombinant(Shingrix) 07/21/2017, 11/03/2017     Objective: Vital Signs: BP 134/78 (BP Location: Left Arm, Patient Position: Sitting, Cuff Size: Normal)   Pulse 80   Resp 14   Ht 5\' 3"  (1.6 m)   Wt 118 lb (53.5 kg)   LMP 07/25/1998   BMI 20.90 kg/m    Physical Exam Vitals and nursing note reviewed.  Constitutional:      Appearance: She is well-developed.  HENT:     Head: Normocephalic and atraumatic.  Eyes:     Conjunctiva/sclera: Conjunctivae normal.  Cardiovascular:     Rate and Rhythm: Normal rate and regular rhythm.     Heart sounds: Normal heart  sounds.  Pulmonary:     Effort: Pulmonary effort is normal.     Breath sounds: Normal breath sounds.  Abdominal:     General: Bowel sounds are normal.     Palpations: Abdomen is soft.  Musculoskeletal:     Cervical back: Normal range of motion.  Lymphadenopathy:     Cervical: No cervical adenopathy.  Skin:    General: Skin is warm and dry.     Capillary Refill: Capillary refill takes less than 2 seconds.     Comments: Mild skin tightness was noted distal to her PIP joints.  Nailbed capillary dilation was noted in her left index finger.  No telangiectasia were noted.  Neurological:     Mental Status: She is alert and oriented to person, place, and time.  Psychiatric:        Behavior: Behavior normal.      Musculoskeletal Exam: She had limited lateral rotation of the cervical spine.  Severe thoracolumbar scoliosis was noted.  She has some tenderness over the upper thoracic region.  Shoulders, elbows, wrists, MCPs PIPs and DIPs with good range of motion.  She had bilateral PIP and DIP thickening.  She had good range of motion of bilateral hip joints.  Bilateral hip joints were replaced.  Knee joints were in good range of motion without any warmth swelling or effusion.  There was no tenderness over ankles or MTPs.  CDAI Exam: CDAI Score: -- Patient Global: --; Provider Global: -- Swollen: --; Tender: -- Joint Exam 08/28/2023   No joint exam has been documented for this visit   There is currently no information documented on the homunculus. Go to the Rheumatology activity and complete the homunculus joint exam.  Investigation: No additional findings.  Imaging: No results found.  Recent Labs: Lab Results  Component Value Date   WBC 8.5 09/26/2022   HGB 12.5 09/26/2022   PLT 320.0 09/26/2022   NA 134 (L) 09/26/2022   K 4.8 09/26/2022   CL 98 09/26/2022   CO2 29 09/26/2022   GLUCOSE 95 09/26/2022   BUN 7 09/26/2022   CREATININE 0.48 09/26/2022   BILITOT 0.4 09/26/2022    ALKPHOS 108 09/26/2022   AST 17 09/26/2022   ALT 9 09/26/2022   PROT 6.4 09/26/2022   ALBUMIN 4.1 09/26/2022   CALCIUM 9.2 09/26/2022   GFRAA >60 03/13/2019    Speciality Comments:  Amlodipine 5mg  2.5 mg because tablets crumble.   Procedures:  No procedures performed Allergies: Celebrex [celecoxib], Inh [isoniazid], Cymbalta [duloxetine hcl], Nucynta [tapentadol], Silenor [doxepin hcl], Benzoin, Duragesic-100 [fentanyl], Keflex [cephalexin], Codeine phosphate, Demerol [meperidine hcl], Lithobid [lithium], and Sulfamethoxazole   Assessment / Plan:     Visit Diagnoses: 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HCC) - 10/06/21: AVISE lupus index -1.1, ANA positive, titer negative, dsDNA equivocal, (RNP, Ro, SSB, Smith, centromere, SCL 70, RNA polymerase 3, Jo 1, CB Negative).  She has mild sclerodactyly with skin tightness distal to PIP joints.  She has severe Raynauds.  Decreased capillary refill was noted.  Capillary dilation was noted in the left index finger.  She has been getting labs to her PCP.  She has been experiencing chronic cough and a chest x-ray last year.  She has an appointment coming up with Dr. Isaiah Serge.  Previous CT scan last year was negative for ILD.  She also has an appointment with a cardiologist in December 2025.  She denies history of increased shortness of breath or palpitations.  [name] disease without gangrene -she has severe Raynauds symptoms which are manageable with amlodipine 7.5 mg daily.  Prescription refill for amlodipine was given today.  Primary osteoarthritis of both hands-she had bilateral PIP and DIP thickening consistent with osteoarthritis.  Joint protection muscle strengthening was discussed.  S/P hip replacement, left-good range of motion without discomfort.  Primary osteoarthritis of left knee-she had good range of motion without discomfort.  Status post right partial knee replacement-doing well.  DDD (degenerative disc disease), cervical - History of cervical  fusion performed by Dr. Lovell Sheehan.  She continues to have limited range of motion of the cervical spine with chronic discomfort.  Patient states she was recently evaluated by Dr. Lovell Sheehan.  Lumbar spondylosis - Severe scoliosis.  Previously under the care of Dr. Berline Chough for pain management.  Other form of scoliosis of thoracolumbar spine-she has some thoracic pain from scoliosis.  A handout on thoracic spine exercises was given.  I offered physical therapy which she declined.  Osteopenia of neck of right femur - October 06, 2021 the BMD measured at [facility]  BMD 0.704 g/cm2 with a T-score of-2.4, BMD 0.704.  Her DXA followed by her GYN.  She will have repeat DEXA this year.  Patient will have repeat DEXA scan this year.  Fibromyalgia-she continues to have generalized pain and discomfort.  She had positive tender points.  She has been under a lot of stress due to loss of her daughter and taking care of her grandchildren.  Chronic pain syndrome -she is on Lyrica..  Other medical problems are listed as follows:  Other fatigue  Prediabetes  Hx of porphyria  History of gastroesophageal reflux (GERD)  History of bipolar disorder  CFS (chronic fatigue syndrome)  Family history of scleroderma  Orders: No orders of the defined types were placed in this encounter.  Meds ordered this encounter  Medications   amLODipine (NORVASC) 5 MG tablet    Sig: Take 1 tablet (5 mg total) by mouth daily.    Dispense:  90 tablet    Refill:  1    PT IS REQUESTING REFILL    Follow-Up Instructions: Return in about 6 months (around 02/25/2024) for Scleroderma, Osteoarthritis.   Pollyann Savoy, MD  Note - This record has been created using Animal nutritionist.  Chart creation errors have been sought, but may not always  have been located. Such creation errors do not reflect on  the standard of medical  care.

## 2023-08-20 ENCOUNTER — Encounter: Payer: Self-pay | Admitting: Family Medicine

## 2023-08-22 ENCOUNTER — Other Ambulatory Visit: Payer: Self-pay | Admitting: Family Medicine

## 2023-08-22 DIAGNOSIS — G9332 Myalgic encephalomyelitis/chronic fatigue syndrome: Secondary | ICD-10-CM

## 2023-08-22 NOTE — Telephone Encounter (Unsigned)
Copied from CRM [id number]. Topic: Clinical - Medication Refill >> Aug 22, 2023 11:42 AM Florestine Avers wrote: Most Recent Primary Care Visit:  Provider: Eustaquio Boyden  Department: [facility]  Visit Type: OFFICE VISIT  Date: 07/10/2023  Medication: amphetamine-dextroamphetamine (ADDERALL XR) 30 MG 24 hr capsule  Has the patient contacted their pharmacy? Yes (Agent: If no, request that the patient contact the pharmacy for the refill. If patient does not wish to contact the pharmacy document the reason why and proceed with request.) (Agent: If yes, when and what did the pharmacy advise?)  Is this the correct pharmacy for this prescription? Yes If no, delete pharmacy and type the correct one.  This is the patient's preferred pharmacy:  [id number] - [place], [place] - [address] AT [facility] [address] [facility] [address] [place] [address] Phone: [phone] Fax: [phone]    Has the prescription been filled recently? Yes  Is the patient out of the medication? Yes  Has the patient been seen for an appointment in the last year OR does the patient have an upcoming appointment? Yes  Can we respond through MyChart? Yes  Agent: Please be advised that Rx refills may take up to 3 business days. We ask that you follow-up with your pharmacy.

## 2023-08-22 NOTE — Telephone Encounter (Signed)
Name of Medication:  Adderall XR Name of Pharmacy:  [facility] Last Fill or Written Date and Quantity: 07/10/23, #30 Last Office Visit and Type:  07/10/23, 3 mo pain mgmt f/u Next Office Visit and Type:  10/10/23, CPE Last Controlled Substance Agreement Date:  04/19/19 Last UDS:  04/19/19

## 2023-08-24 ENCOUNTER — Encounter: Payer: Self-pay | Admitting: Family Medicine

## 2023-08-24 ENCOUNTER — Other Ambulatory Visit: Payer: Self-pay | Admitting: Family Medicine

## 2023-08-24 DIAGNOSIS — G9332 Myalgic encephalomyelitis/chronic fatigue syndrome: Secondary | ICD-10-CM

## 2023-08-24 MED ORDER — AMPHETAMINE-DEXTROAMPHET ER 30 MG PO CP24: ORAL_CAPSULE | ORAL | 0 refills | Status: DC

## 2023-08-24 NOTE — Telephone Encounter (Signed)
ERx

## 2023-08-24 NOTE — Telephone Encounter (Unsigned)
Copied from CRM [id number]. Topic: Clinical - Medication Question >> Aug 24, 2023  8:20 AM Samuel Jester B wrote: Reason for CRM:  Patient called in regarding prescription amphetamine-dextroamphetamine (ADDERALL XR) 30 MG 24 hr capsule wanting it to be sent over to the [id number] - [place], [place] - [address]

## 2023-08-24 NOTE — Telephone Encounter (Signed)
See 08/22/23 refill note

## 2023-08-24 NOTE — Telephone Encounter (Signed)
Duplicate request (see 08/22/23 refill note).  Removed request and closing this encounter.

## 2023-08-24 NOTE — Telephone Encounter (Signed)
Copied from CRM [id number]. Topic: Clinical - Medication Refill >> Aug 24, 2023  8:25 AM Shelbie Proctor wrote: Most Recent Primary Care Visit:  Provider: Eustaquio Boyden  Department: [facility]  Visit Type: OFFICE VISIT  Date: 07/10/2023  Medication: amphetamine-dextroamphetamine (ADDERALL XR) 30 MG 24 hr capsule #30  Has the patient contacted their pharmacy? Yes, the pharmacy possibly has to order the medication, if so patient will be out of medication. Patient is asking to have this done today, has called several times now.  (Agent: If no, request that the patient contact the pharmacy for the refill. If patient does not wish to contact the pharmacy document the reason why and proceed with request.) (Agent: If yes, when and what did the pharmacy advise?)  Is this the correct pharmacy for this prescription? Yes If no, delete pharmacy and type the correct one.  This is the patient's preferred pharmacy:  [id number] - [place], [place] - [address] AT [facility] [address] [facility] [address] [place] [address] Phone: [phone] Fax: [phone]  Has the prescription been filled recently? No  Is the patient out of the medication? No  Has the patient been seen for an appointment in the last year OR does the patient have an upcoming appointment? Yes  Can we respond through MyChart? No, please call back [phone]  Agent: Please be advised that Rx refills may take up to 3 business days. We ask that you follow-up with your pharmacy.

## 2023-08-28 ENCOUNTER — Ambulatory Visit: Payer: Medicare Other | Attending: Rheumatology | Admitting: Rheumatology

## 2023-08-28 ENCOUNTER — Encounter: Payer: Self-pay | Admitting: Rheumatology

## 2023-08-28 VITALS — BP 134/78 | HR 80 | Resp 14 | Ht 63.0 in | Wt 118.0 lb

## 2023-08-28 DIAGNOSIS — M47816 Spondylosis without myelopathy or radiculopathy, lumbar region: Secondary | ICD-10-CM

## 2023-08-28 DIAGNOSIS — Z96642 Presence of left artificial hip joint: Secondary | ICD-10-CM | POA: Diagnosis not present

## 2023-08-28 DIAGNOSIS — M5030 Other cervical disc degeneration, unspecified cervical region: Secondary | ICD-10-CM

## 2023-08-28 DIAGNOSIS — G894 Chronic pain syndrome: Secondary | ICD-10-CM

## 2023-08-28 DIAGNOSIS — M349 Systemic sclerosis, unspecified: Secondary | ICD-10-CM

## 2023-08-28 DIAGNOSIS — R5383 Other fatigue: Secondary | ICD-10-CM

## 2023-08-28 DIAGNOSIS — M19041 Primary osteoarthritis, right hand: Secondary | ICD-10-CM

## 2023-08-28 DIAGNOSIS — M4185 Other forms of scoliosis, thoracolumbar region: Secondary | ICD-10-CM

## 2023-08-28 DIAGNOSIS — M85851 Other specified disorders of bone density and structure, right thigh: Secondary | ICD-10-CM

## 2023-08-28 DIAGNOSIS — Z8659 Personal history of other mental and behavioral disorders: Secondary | ICD-10-CM

## 2023-08-28 DIAGNOSIS — M797 Fibromyalgia: Secondary | ICD-10-CM

## 2023-08-28 DIAGNOSIS — Z8269 Family history of other diseases of the musculoskeletal system and connective tissue: Secondary | ICD-10-CM

## 2023-08-28 DIAGNOSIS — I7300 Raynaud's syndrome without gangrene: Secondary | ICD-10-CM | POA: Diagnosis not present

## 2023-08-28 DIAGNOSIS — Z96651 Presence of right artificial knee joint: Secondary | ICD-10-CM

## 2023-08-28 DIAGNOSIS — G9332 Myalgic encephalomyelitis/chronic fatigue syndrome: Secondary | ICD-10-CM

## 2023-08-28 DIAGNOSIS — Z8719 Personal history of other diseases of the digestive system: Secondary | ICD-10-CM

## 2023-08-28 DIAGNOSIS — M19042 Primary osteoarthritis, left hand: Secondary | ICD-10-CM

## 2023-08-28 DIAGNOSIS — M1712 Unilateral primary osteoarthritis, left knee: Secondary | ICD-10-CM

## 2023-08-28 DIAGNOSIS — Z8639 Personal history of other endocrine, nutritional and metabolic disease: Secondary | ICD-10-CM

## 2023-08-28 DIAGNOSIS — R7303 Prediabetes: Secondary | ICD-10-CM

## 2023-08-28 MED ORDER — AMLODIPINE BESYLATE 5 MG PO TABS: 5.0000 mg | ORAL_TABLET | Freq: Every day | ORAL | 1 refills | Status: DC

## 2023-08-28 NOTE — Patient Instructions (Signed)
Thoracic Strain Rehab Ask your health care provider which exercises are safe for you. Do exercises exactly as told by your provider and adjust them as directed. It is normal to feel mild stretching, pulling, tightness, or discomfort as you do these exercises. Stop right away if you feel sudden pain or your pain gets worse. Do not begin these exercises until told by your provider. Stretching and range-of-motion exercise This exercise warms up your muscles and joints and improves the movement and flexibility of your back and shoulders. This exercise also helps to relieve pain. Chest and spine stretch  Lie down on your back on a firm surface. Roll a towel or a small blanket so it is about 4 inches (10 cm) in diameter. Put the towel under the middle of your back so it is under your spine, but not under your shoulder blades. Put your hands behind your head and let your elbows fall to your sides. This will increase your stretch. Take a deep breath (inhale). Hold for __________ seconds. Relax after you breathe out (exhale). Repeat __________ times. Complete this exercise __________ times a day. Strengthening exercises These exercises build strength and endurance in your back and your shoulder blade muscles. Endurance is the ability to use your muscles for a long time, even after they get tired. Alternating arm and leg raises  Get on your hands and knees on a firm surface. If you are on a hard floor, you may want to use padding, such as an exercise mat, to cushion your knees. Line up your arms and legs. Your hands should be directly below your shoulders, and your knees should be directly below your hips. Lift your left leg behind you. At the same time, raise your right arm and straighten it in front of you. Do not lift your leg higher than your hip. Do not lift your arm higher than your shoulder. Keep your abdominal and back muscles tight. Keep your hips facing the ground. Do not arch your  back. Carefully stay balanced. Do not hold your breath. Hold for __________ seconds. Slowly return to the starting position and repeat with your right leg and your left arm. Repeat __________ times. Complete this exercise __________ times a day. Straight arm rows This exercise is also called the shoulder extension exercise. Stand with your feet shoulder width apart. Secure an exercise band to a stable object in front of you so the band is at or above shoulder height. Hold one end of the exercise band in each hand. Straighten your elbows and lift your hands up to shoulder height. Step back, away from the secured end of the exercise band, until the band stretches. Squeeze your shoulder blades together and pull your hands down to the sides of your thighs. Stop when your hands are straight down by your sides. This is shoulder extension. Do not let your hands go behind your body. Hold for __________ seconds. Slowly return to the starting position. Repeat __________ times. Complete this exercise __________ times a day. Rowing scapular retraction This is an exercise in which the shoulder blades (scapulae) are pulled toward each other (retraction). Sit in a stable chair without armrests, or stand up. Secure an exercise band to a stable object in front of you so the band is at shoulder height. Hold one end of the exercise band in each hand. Your palms should face toward each other. Bring your arms out straight in front of you. Step back, away from the secured end of the  exercise band, until the band stretches. Pull the band backward. As you do this, bend your elbows and squeeze your shoulder blades together, but avoid letting the rest of your body move. Do not shrug your shoulders upward while you do this. Stop when your elbows are at your sides or slightly behind your body. Hold for __________ seconds. Slowly straighten your arms to return to the starting position. Repeat __________ times.  Complete this exercise __________ times a day. Posture and body mechanics Good posture and healthy body mechanics can help to relieve stress in your body's tissues and joints. Body mechanics refers to the movements and positions of your body while you do your daily activities. Posture is part of body mechanics. Good posture means: Your spine is in its natural S-curve position (neutral). Your shoulders are pulled back slightly. Your head is not tipped forward. Follow these guidelines to improve your posture and body mechanics in your everyday activities. Standing  When standing, keep your spine neutral and your feet about hip width apart. Keep a slight bend in your knees. Your ears, shoulders, and hips should line up with each other. When you do a task in which you lean forward while standing in one place for a long time, place one foot up on a stable object that is 2-4 inches (5-10 cm) high, such as a footstool. This helps keep your spine neutral. Sitting  When sitting, keep your spine neutral and keep your feet flat on the floor. Use a footrest if needed. Keep your thighs parallel to the floor. Avoid rounding your shoulders, and avoid tilting your head forward. When working at a desk or a computer, keep your desk at a height where your hands are slightly lower than your elbows. Slide your chair under your desk so you are close enough to maintain good posture. When working at a computer, place your monitor at a height where you are looking straight ahead and you do not have to tilt your head forward or downward to look at the screen. Resting When lying down and resting, avoid positions that are most painful for you. If you have pain with activities such as sitting, bending, stooping, or squatting (flexion-basedactivities), lie in a position in which your body does not bend very much. For example, avoid curling up on your side with your arms and knees near your chest (fetal position). If you have  pain with activities such as standing for a long time or reaching with your arms (extension-basedactivities), lie with your spine in a neutral position and bend your knees slightly. Try the following positions: Lie on your side with a pillow between your knees. Lie on your back with a pillow under your knees.  Lifting  When lifting objects, keep your feet at least shoulder width apart and tighten your abdominal muscles. Bend your knees and hips and keep your spine neutral. It is important to lift using the strength of your legs, not your back. Do not lock your knees straight out. Always ask for help to lift heavy or awkward objects. This information is not intended to replace advice given to you by your health care provider. Make sure you discuss any questions you have with your health care provider. Document Revised: 12/23/2022 Document Reviewed: 02/28/2022 Elsevier Patient Education  2024 [employer].

## 2023-09-13 ENCOUNTER — Other Ambulatory Visit: Payer: Self-pay

## 2023-09-13 MED ORDER — PANTOPRAZOLE SODIUM 40 MG PO TBEC: DELAYED_RELEASE_TABLET | ORAL | 0 refills | Status: DC

## 2023-09-20 ENCOUNTER — Encounter: Payer: Self-pay | Admitting: Physician Assistant

## 2023-09-20 ENCOUNTER — Other Ambulatory Visit: Payer: Self-pay | Admitting: Family Medicine

## 2023-09-20 ENCOUNTER — Ambulatory Visit (INDEPENDENT_AMBULATORY_CARE_PROVIDER_SITE_OTHER): Payer: Medicare Other | Admitting: Physician Assistant

## 2023-09-20 VITALS — BP 126/82 | HR 83 | Ht 63.25 in | Wt 120.4 lb

## 2023-09-20 DIAGNOSIS — G894 Chronic pain syndrome: Secondary | ICD-10-CM

## 2023-09-20 DIAGNOSIS — K5903 Drug induced constipation: Secondary | ICD-10-CM | POA: Diagnosis not present

## 2023-09-20 DIAGNOSIS — T402X5A Adverse effect of other opioids, initial encounter: Secondary | ICD-10-CM | POA: Diagnosis not present

## 2023-09-20 DIAGNOSIS — K219 Gastro-esophageal reflux disease without esophagitis: Secondary | ICD-10-CM

## 2023-09-20 DIAGNOSIS — K831 Obstruction of bile duct: Secondary | ICD-10-CM

## 2023-09-20 DIAGNOSIS — K5904 Chronic idiopathic constipation: Secondary | ICD-10-CM

## 2023-09-20 MED ORDER — PANTOPRAZOLE SODIUM 40 MG PO TBEC: DELAYED_RELEASE_TABLET | ORAL | 3 refills | Status: AC

## 2023-09-20 MED ORDER — NALOXEGOL OXALATE 25 MG PO TABS: 25.0000 mg | ORAL_TABLET | Freq: Every day | ORAL | 3 refills | Status: AC

## 2023-09-20 MED ORDER — LINACLOTIDE 290 MCG PO CAPS: 290.0000 ug | ORAL_CAPSULE | Freq: Every day | ORAL | 2 refills | Status: DC

## 2023-09-20 MED ORDER — URSODIOL 300 MG PO CAPS: 300.0000 mg | ORAL_CAPSULE | Freq: Two times a day (BID) | ORAL | 3 refills | Status: AC

## 2023-09-20 NOTE — Patient Instructions (Addendum)
 VISIT SUMMARY:  During your visit, we discussed your ongoing issues with constipation due to your use of opioid medications. We also reviewed your current medications and their effectiveness, particularly when taken at the optimal time. We discussed your stable condition with bile duct stones and your well-controlled gastroesophageal reflux disease.  YOUR PLAN:  -OPIOID-INDUCED CONSTIPATION: This is a condition where your use of opioid medications, like morphine, causes constipation. We discussed the importance of taking your medications, Movantik and Linzess, in the morning, ideally 30 minutes before a meal. You may also need to adjust the timing of your MiraLAX based on your bowel movement frequency and consistency.  -CHOLESTATIC LIVER DISEASE: This is a condition where your liver doesn't function properly, causing bile to build up in your body. You are stable on ursodiol, a medication that helps prevent bile duct stone formation. You should continue taking this medication as prescribed.  -GASTROESOPHAGEAL REFLUX DISEASE: This is a condition where stomach acid frequently flows back into your esophagus, causing irritation. Your condition is well controlled on Protonix, a medication that reduces the production of stomach acid. You should continue taking this medication as prescribed.  INSTRUCTIONS:  Please continue to take your medications as discussed. Remember to take Movantik and Linzess in the morning, ideally 30 minutes before a meal. If you can't eat in the morning, just go ahead and take the linzess and movantik,. You may also need to adjust the timing of your MiraLAX based on your bowel movement frequency and consistency. Continue taking ursodiol and Protonix as prescribed. Your next colonoscopy is due in August 2029 unless changes occur.

## 2023-09-20 NOTE — Progress Notes (Signed)
 09/20/2023 Maison Agrusa [id number] 1956/03/30  Referring provider: Eustaquio Boyden, MD Primary GI doctor: Dr. Chales Abrahams ( Dr. Russella Dar)  ASSESSMENT AND PLAN:     Opioid-induced constipation:  Inconsistent bowel movements despite taking Movantik, Linzess, and MiraLAX. Discussed the importance of consistent timing and food intake with these medications. -Continue Movantik and Linzess in the morning, ideally 30 minutes before a meal. -If unable to take Movantik in the morning, do not take Linzess. -Consider adjusting the timing of MiraLAX based on bowel movement frequency and consistency.  Cholestatic liver disease:  Stable on ursodiol due to history of bile duct stones. -Continue ursodiol to prevent bile duct stone formation.  Gastroesophageal reflux disease:  Well controlled on Protonix. -Continue Protonix as prescribed.  General Health Maintenance: -Next colonoscopy due in August 2029 unless changes occur.      Patient Care Team: Eustaquio Boyden, MD as PCP - General (Family Medicine) Meryl Dare, MD (Inactive) as Consulting Physician (Gastroenterology) Elmon Else, MD as Consulting Physician (Dermatology) Stephannie Li, MD as Consulting Physician (Ophthalmology) Maris Berger, MD as Consulting Physician (Ophthalmology) Logan Bores Marveen Reeks, MD as Referring Physician (Urology) Pollyann Savoy, MD as Consulting Physician (Rheumatology) Tressie Stalker, MD as Consulting Physician (Neurosurgery) Ardell Isaacs, Forrestine Him, MD as Consulting Physician (Obstetrics and Gynecology) Ardell Isaacs, Forrestine Him, MD as Consulting Physician (Obstetrics and Gynecology)  HISTORY OF PRESENT ILLNESS: 68 y.o. female with a past medical history of depression, [name], chronic fatigue syndrome, history of cervical spine fusion 2023 with epidural hematoma, GERD, biliary stasis, IBS and constipation  and others listed below presents for refills. .   Patient has history of  biliary stasis status post ERCP and cholecystectomy on ursodiol 300 mg twice daily.   Normal liver function previously 09/2022. Patient has constipation on Linzess 290 mcg daily, Movantik 12.5 mg daily as well as MiraLAX twice daily as needed. Patient is due for 10-year interval recall colonoscopy August 2029. Discussed the use of AI scribe software for clinical note transcription with the patient, who gave verbal consent to proceed.  History of Present Illness   The patient, with opioid-induced constipation, presents with bowel management issues related to opioid use.  She experiences bowel management issues attributed to her use of morphine sulfate 15 mg twice daily. Her current regimen includes Movantik, Linzess, and MiraLAX. Movantik is taken 30 minutes to 2 hours before meals, Linzess 30 minutes before meals, and MiraLAX once or twice daily. The effectiveness of the regimen varies, especially when doses are delayed.  Her schedule and activities, such as taking her children to school, sometimes prevent her from taking her medications at the optimal time. She finds that taking her medications in the morning works best, but if delayed, she takes them 2-3 hours after meals. On days she misses the morning dose of Movantik, she often skips Linzess as well, as she feels they work better together.  She experiences abdominal cramping and bloating, particularly when she cannot take her medications as scheduled. No nausea, vomiting, heartburn, or weight loss. Her weight has been stable, and she denies alcohol, smoking, or drug use. She is also on ursodiol for bile duct stones, which was recommended to be continued indefinitely due to her history of stone production.      She  reports that she has never smoked. She has never been exposed to tobacco smoke. She has never used smokeless tobacco. She reports that she does not drink alcohol and does not use drugs.  Wt  Readings from Last 10 Encounters:   09/20/23 120 lb 6.4 oz (54.6 kg)  08/28/23 118 lb (53.5 kg)  07/10/23 117 lb 4 oz (53.2 kg)  06/20/23 116 lb 8 oz (52.8 kg)  05/08/23 116 lb (52.6 kg)  04/10/23 115 lb (52.2 kg)  02/06/23 116 lb (52.6 kg)  12/27/22 112 lb 2 oz (50.9 kg)  10/19/22 116 lb (52.6 kg)  09/26/22 116 lb 8 oz (52.8 kg)    RELEVANT GI HISTORY, LABS, IMAGING: 02/22/2018 colonoscopy Dr. Russella Dar for positive Cologuard - The entire examined colon is normal on direct and retroflexion views. - No specimens collected.  06/29/2022 echocardiogram 1. Left ventricular ejection fraction, by estimation, is 55 to 60% . The left ventricle has normal function. The left ventricle has no regional wall motion abnormalities. Left ventricular diastolic parameters are consistent with Grade I diastolic dysfunction ( impaired relaxation) . 2. Right ventricular systolic function is normal. The right ventricular size is normal. 3. The mitral valve is normal in structure. No evidence of mitral valve regurgitation. No evidence of mitral stenosis. 4. The aortic valve is normal in structure. Aortic valve regurgitation is trivial. No aortic stenosis is present.  CBC    Component Value Date/Time   WBC 8.5 09/26/2022 1208   RBC 4.40 09/26/2022 1208   HGB 12.5 09/26/2022 1208   HGB 12.3 02/04/2014 0000   HCT 37.1 09/26/2022 1208   PLT 320.0 09/26/2022 1208   MCV 84.4 09/26/2022 1208   MCH 28.2 01/13/2022 0546   MCHC 33.6 09/26/2022 1208   RDW 14.3 09/26/2022 1208   LYMPHSABS 1.6 09/26/2022 1208   MONOABS 0.5 09/26/2022 1208   EOSABS 0.1 09/26/2022 1208   BASOSABS 0.1 09/26/2022 1208   Recent Labs    09/26/22 1208  HGB 12.5    CMP     Component Value Date/Time   NA 134 (L) 09/26/2022 1208   K 4.8 09/26/2022 1208   K 4.3 02/04/2014 0000   CL 98 09/26/2022 1208   CO2 29 09/26/2022 1208   GLUCOSE 95 09/26/2022 1208   BUN 7 09/26/2022 1208   CREATININE 0.48 09/26/2022 1208   CREATININE 0.65 10/27/2021 0922   CALCIUM 9.2  09/26/2022 1208   PROT 6.4 09/26/2022 1208   ALBUMIN 4.1 09/26/2022 1208   AST 17 09/26/2022 1208   AST 18 02/04/2014 0000   ALT 9 09/26/2022 1208   ALT 13 02/04/2014 0000   ALKPHOS 108 09/26/2022 1208   ALKPHOS 84 02/04/2014 0000   BILITOT 0.4 09/26/2022 1208   BILITOT 0.4 02/04/2014 0000   GFRNONAA >60 01/13/2022 0546   GFRNONAA >89 04/11/2013 1213   GFRAA >60 03/13/2019 0424   GFRAA >89 04/11/2013 1213      Latest Ref Rng & Units 09/26/2022   12:08 PM 01/10/2022    6:15 AM 10/29/2021    9:08 AM  Hepatic Function  Total Protein 6.0 - 8.3 g/dL 6.4  5.6  6.4   Albumin 3.5 - 5.2 g/dL 4.1  3.2  3.9   AST 0 - 37 U/L 17  14  38   ALT 0 - 35 U/L 9  9  19    Alk Phosphatase 39 - 117 U/L 108  72  82   Total Bilirubin 0.2 - 1.2 mg/dL 0.4  0.5  0.9       Current Medications:    Current Outpatient Medications (Cardiovascular):    amLODipine (NORVASC) 2.5 MG tablet, TAKE 1 TABLET BY MOUTH DAILY   amLODipine (NORVASC)  5 MG tablet, Take 1 tablet (5 mg total) by mouth daily.   Current Outpatient Medications (Analgesics):    morphine (MSIR) 15 MG tablet, TAKE ONE TABLET BY MOUTH EVERY 6 HOURS AS NEEDED FOR SEVERE PAIN  Current Outpatient Medications (Hematological):    cyanocobalamin (VITAMIN B12) 1000 MCG/ML injection, INJECT INTO THE MUSCLE EVERY 21 DAYSAS DIRECTED   ferrous sulfate 325 (65 FE) MG tablet, Take 1 tablet (325 mg total) by mouth every Monday, Wednesday, and Friday.  Current Outpatient Medications (Other):    ALPRAZolam (XANAX) 1 MG tablet, Take 0.5 tablets (0.5 mg total) by mouth at bedtime.   amphetamine-dextroamphetamine (ADDERALL XR) 30 MG 24 hr capsule, TAKE 1 CAPSULE BY MOUTH EVERY MORNING   ARIPiprazole (ABILIFY) 5 MG tablet, Take 5 mg by mouth at bedtime.   Calcium Carbonate-Vitamin D (CALTRATE 600+D PO), Take 2 tablets by mouth daily.   clindamycin (CLEOCIN) 150 MG capsule, Take 600mg  1 hour prior to dental procedure   Eszopiclone 3 MG TABS, TAKE 1 TABLET BY  MOUTH AT BEDTIME (TAKE IMMEDIATLEY BEFORE BEDTIME)   lamoTRIgine (LAMICTAL) 200 MG tablet, Take 1 tablet (200 mg total) by mouth daily.   lidocaine (LIDODERM) 5 %, Place 3 patches onto the skin daily. Remove & Discard patch within 12 hours or as directed by MD   lidocaine (XYLOCAINE) 5 % ointment, Apply 1 Application topically as needed. Apply up to 4x/day- on LUE for nerve pain   NONFORMULARY OR COMPOUNDED ITEM, Testosterone proprionate petrolatum (jar) 2% ointment  2 Grams S: Apply a small amount once daily x 5 days per week.  [facility].   ondansetron (ZOFRAN) 4 MG tablet, TAKE 1 TABLET BY MOUTH EVERY 8 HOURS AS NEEDED FOR NAUSEA AND VOMITING   polyethylene glycol (MIRALAX / GLYCOLAX) 17 g packet, Take 17 g by mouth 2 (two) times daily.   Prasterone (INTRAROSA) 6.5 MG INST, UNWRAP AND INSERT 1 SUPPOSITORY  VAGINALLY WITH PROVIDED  APPLICATOR AT BEDTIME   pregabalin (LYRICA) 150 MG capsule, Take 150 mg by mouth 2 (two) times daily.   tiZANidine (ZANAFLEX) 2 MG tablet, TAKE 1 TABLET BY MOUTH EVERY 8 HOURS AS NEEDED   lidocaine (XYLOCAINE) 2 % solution, Use as directed 15 mLs in the mouth or throat every 4 (four) hours as needed for mouth pain. (Patient not taking: Reported on 09/20/2023)   linaclotide (LINZESS) 290 MCG CAPS capsule, Take 1 capsule (290 mcg total) by mouth daily.   naloxegol oxalate (MOVANTIK) 25 MG TABS tablet, Take 1 tablet (25 mg total) by mouth daily.   pantoprazole (PROTONIX) 40 MG tablet, TAKE ONE TABLET BY MOUTH TWICE DAILY WITH MEALS   phenazopyridine (PYRIDIUM) 100 MG tablet, Take 200 mg by mouth 3 (three) times daily as needed. (Patient not taking: Reported on 09/20/2023)   ursodiol (ACTIGALL) 300 MG capsule, Take 1 capsule (300 mg total) by mouth 2 (two) times daily.  Medical History:  Past Medical History:  Diagnosis Date   Abdominal pain last 4 months   and nausea also   Allergy    Anemia    history of   Anxiety    Bipolar disorder (HCC)    atpical  bipolar disorder   Cervical disc disease limited rom turning to left   hx. C6- C7 -hx. past fusion(bone graft used)   Cholecystitis    Chronic pain    DDD (degenerative disc disease), lumbar 09/2015   dextroscoliosis with multilevel DDD and facet arthrosis most notable for R foraminal disc  protrusion L4/5 producing severe R neural foraminal stenosis abutting R L4 nerve root, moderate spinal canal and mild lat recesss and R neural foraminal stenosis L3/4 (MRI)   Depression    bipolar depression   Disorders of porphyrin metabolism    Felon of finger of left hand 11/10/2016   Fibromyalgia    GERD (gastroesophageal reflux disease)    Headache    occasionally   Internal hemorrhoids    Interstitial cystitis 06/06/2012   hx.   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)   Tuberculosis    Allergies:  Allergies  Allergen Reactions   Celebrex [Celecoxib] Rash    Headaches   Inh [Isoniazid] Other (See Comments)    Hepatitis   Cymbalta [Duloxetine Hcl] Other (See Comments)    Urinary retention   Nucynta [Tapentadol] Other (See Comments)    Agitation Jerking in legs   Silenor [Doxepin Hcl] Other (See Comments)    Nightmare Dizziness  Stumbling upon walking Not effective   Benzoin Dermatitis   Duragesic-100 [Fentanyl] Other (See Comments)    Cold intolerance Arms twitching Insomnia  Fatigue    Keflex [Cephalexin] Nausea And Vomiting   Codeine Phosphate Nausea And Vomiting   Demerol [Meperidine Hcl] Rash    Red skin    Lithobid [Lithium] Other (See Comments)    Sores in mouth    Sulfamethoxazole Rash     Surgical History:  She  has a past surgical history that includes Anterior cervical decomp/discectomy fusion (2004); Cystoscopy; Hysteroscopy w/ endometrial ablation; Breast enhancement surgery (2010); Nasal sinus surgery; ERCP (05/22/2012); Cesarean section (1985); Hernia repair; Cholecystectomy (06/11/2012); Bunionectomy (Bilateral, yrs ago); ERCP (N/A, 09/17/2013); Hemorrhoid banding (09/23/2013); Breast implant exchange (10/2014); Anterior cervical decomp/discectomy fusion (02/2016); Esophagogastroduodenoscopy (09/2016); Augmentation mammaplasty (Bilateral, 03/25/2010); Colonoscopy (02/2018); Upper gastrointestinal endoscopy; ERCP (N/A, 09/27/2018); sphincterotomy (09/27/2018); removal of stones (09/27/2018); Total hip arthroplasty (Left, 03/12/2019); Partial knee arthroplasty (Right, 06/01/2020); Knee arthroscopy (Right, 04/06/2021); Tonsillectomy; Anterior cervical decompression/discectomy fusion 4 level (N/A, 12/29/2021); Posterior cervical fusion/foraminotomy (N/A, 12/29/2021); and Anterior cervical decomp/discectomy fusion (12/29/2021). Family History:  Her family history includes Arthritis in her brother; CAD (age of onset: 28) in her father; Diabetes in her maternal grandmother; Esophageal cancer in her paternal grandfather; Esophageal cancer (age of onset: 75) in her father; Hypertension in her mother; Scleroderma in her mother; Stomach cancer in her father and paternal grandfather.  REVIEW OF SYSTEMS  : All other systems reviewed and negative except where noted in the History of Present Illness.  PHYSICAL EXAM: BP 126/82 (BP Location: Left Arm, Patient Position: Sitting, Cuff Size: Normal)   Pulse 83   Ht 5' 3.25" (1.607 m)   Wt 120 lb 6.4 oz (54.6 kg)   LMP 07/25/1998   BMI 21.16 kg/m  General Appearance: Well nourished, in no apparent distress. Head:   Normocephalic and atraumatic. Eyes:  sclerae  anicteric,conjunctive pink  Respiratory: Respiratory effort normal, BS equal bilaterally without rales, rhonchi, wheezing. Cardio: RRR with no MRGs. Peripheral  pulses intact.  Abdomen: Soft,  Obese ,active bowel sounds. No tenderness .Marland Kitchen No masses. Rectal: Not evaluated Musculoskeletal: Full ROM, Normal gait. Without edema. Skin:  Dry and intact without significant lesions or rashes Neuro: Alert and  oriented x4;  No focal deficits. Psych:  Cooperative. Normal mood and affect.    Doree Albee, PA-C 12:15 PM

## 2023-09-20 NOTE — Telephone Encounter (Signed)
 Name of Medication:  MSIR Name of Pharmacy:  [facility] Last Fill or Written Date and Quantity:  08/10/23, #90 Last Office Visit and Type:  07/10/23, 3 mo pain mgmt f/u  Next Office Visit and Type:  10/10/23, CPE Last Controlled Substance Agreement Date:  04/19/19 Last UDS:  04/19/19

## 2023-09-21 NOTE — Telephone Encounter (Signed)
 ERx

## 2023-09-27 ENCOUNTER — Encounter: Payer: Self-pay | Admitting: Obstetrics and Gynecology

## 2023-10-02 ENCOUNTER — Other Ambulatory Visit: Payer: Self-pay | Admitting: Family Medicine

## 2023-10-02 DIAGNOSIS — G4700 Insomnia, unspecified: Secondary | ICD-10-CM

## 2023-10-02 NOTE — Telephone Encounter (Signed)
 ERx

## 2023-10-02 NOTE — Telephone Encounter (Signed)
 Name of Medication: Eszopiclone Name of Pharmacy: [facility] Last Fill or Written Date and Quantity: 07/24/23, #90 Last Office Visit and Type:  07/10/23, 3 mo pain mgmt f/u  Next Office Visit and Type:  10/10/23, CPE Last Controlled Substance Agreement Date: 04/19/19 Last UDS: 04/19/19

## 2023-10-03 ENCOUNTER — Encounter: Payer: Self-pay | Admitting: Family Medicine

## 2023-10-03 ENCOUNTER — Other Ambulatory Visit: Payer: Self-pay | Admitting: Family Medicine

## 2023-10-03 DIAGNOSIS — J309 Allergic rhinitis, unspecified: Secondary | ICD-10-CM

## 2023-10-05 ENCOUNTER — Telehealth: Payer: Self-pay

## 2023-10-05 DIAGNOSIS — J309 Allergic rhinitis, unspecified: Secondary | ICD-10-CM

## 2023-10-05 MED ORDER — FLUTICASONE PROPIONATE 50 MCG/ACT NA SUSP: 2.0000 | Freq: Every day | NASAL | 6 refills | Status: DC

## 2023-10-05 NOTE — Telephone Encounter (Signed)
 ERx

## 2023-10-05 NOTE — Telephone Encounter (Signed)
 Copied from CRM [id number]. Topic: Clinical - Prescription Issue >> Oct 05, 2023  2:39 PM Eunice Blase wrote: Reason for CRM: Received call from [facility] - [place], [place] - [address] [place] [address] Phone: [phone] Fax: [phone], per Corky Mull regarding fluticasone (FLONASE) 50 MCG/ACT nasal spray, stated this would need to be sent to regular pharmacy.

## 2023-10-05 NOTE — Telephone Encounter (Signed)
 Sent to local pharmacy.

## 2023-10-09 ENCOUNTER — Other Ambulatory Visit: Payer: Self-pay | Admitting: Physical Medicine and Rehabilitation

## 2023-10-10 ENCOUNTER — Other Ambulatory Visit: Payer: Self-pay | Admitting: Rheumatology

## 2023-10-10 ENCOUNTER — Encounter: Payer: Self-pay | Admitting: Family Medicine

## 2023-10-10 ENCOUNTER — Ambulatory Visit: Payer: Medicare Other | Admitting: Family Medicine

## 2023-10-10 VITALS — BP 146/78 | HR 81 | Temp 97.9°F | Ht 62.5 in | Wt 122.0 lb

## 2023-10-10 DIAGNOSIS — H3553 Other dystrophies primarily involving the sensory retina: Secondary | ICD-10-CM

## 2023-10-10 DIAGNOSIS — Z7189 Other specified counseling: Secondary | ICD-10-CM

## 2023-10-10 DIAGNOSIS — R7303 Prediabetes: Secondary | ICD-10-CM

## 2023-10-10 DIAGNOSIS — G9332 Myalgic encephalomyelitis/chronic fatigue syndrome: Secondary | ICD-10-CM

## 2023-10-10 DIAGNOSIS — G8929 Other chronic pain: Secondary | ICD-10-CM | POA: Diagnosis not present

## 2023-10-10 DIAGNOSIS — M797 Fibromyalgia: Secondary | ICD-10-CM

## 2023-10-10 DIAGNOSIS — G4700 Insomnia, unspecified: Secondary | ICD-10-CM

## 2023-10-10 DIAGNOSIS — K219 Gastro-esophageal reflux disease without esophagitis: Secondary | ICD-10-CM

## 2023-10-10 DIAGNOSIS — M85851 Other specified disorders of bone density and structure, right thigh: Secondary | ICD-10-CM

## 2023-10-10 DIAGNOSIS — R030 Elevated blood-pressure reading, without diagnosis of hypertension: Secondary | ICD-10-CM

## 2023-10-10 DIAGNOSIS — E785 Hyperlipidemia, unspecified: Secondary | ICD-10-CM

## 2023-10-10 DIAGNOSIS — E611 Iron deficiency: Secondary | ICD-10-CM

## 2023-10-10 DIAGNOSIS — F331 Major depressive disorder, recurrent, moderate: Secondary | ICD-10-CM

## 2023-10-10 DIAGNOSIS — Z0000 Encounter for general adult medical examination without abnormal findings: Secondary | ICD-10-CM | POA: Diagnosis not present

## 2023-10-10 DIAGNOSIS — M349 Systemic sclerosis, unspecified: Secondary | ICD-10-CM

## 2023-10-10 NOTE — Assessment & Plan Note (Signed)
 Chronic, stable period on Lunesta nightly

## 2023-10-10 NOTE — Assessment & Plan Note (Signed)
 BP elevated today - will continue to monitor without med changes. She is already on amlodipine for h/o raynaud's through rheum.

## 2023-10-10 NOTE — Telephone Encounter (Signed)
 Last Fill: 12/14/2021  Next Visit: 08/28/2023  Last Visit: 02/06/2023  Dx: Chronic pain syndrome   Current Dose per office note on 08/28/2023: she is on Lyrica  Okay to refill Lyrica?

## 2023-10-10 NOTE — Assessment & Plan Note (Signed)
 Continues pantoprazole BID through GI.

## 2023-10-10 NOTE — Assessment & Plan Note (Signed)
 Preventative protocols reviewed and updated unless pt declined. Discussed healthy diet and lifestyle.

## 2023-10-10 NOTE — Assessment & Plan Note (Signed)
 On adderall XR through our office

## 2023-10-10 NOTE — Progress Notes (Signed)
 Ph: [phone] Fax: [phone]   Patient ID: Rachel Duran, female    DOB: [birth year], 68 y.o.   MRN: [id number]  This visit was conducted in person.  BP (!) 146/78   Pulse 81   Temp 97.9 F (36.6 C) (Oral)   Ht 5' 2.5" (1.588 m)   Wt 122 lb (55.3 kg)   LMP 07/25/1998   SpO2 98%   BMI 21.96 kg/m    CC: AMW /CPE Subjective:   HPI: Rachel Duran is a 68 y.o. female presenting on 10/10/2023 for Medicare Wellness   Did not see health advisor.  Hearing Screening   500Hz  1000Hz  2000Hz  4000Hz   Right ear 20 20 20 20   Left ear 20 20 20 20   Vision Screening - Comments:: Last eye exam, 07/2023.  Flowsheet Row Office Visit from 10/10/2023 in [facility] [place]  PHQ-2 Total Score 1          10/10/2023   11:39 AM 07/10/2023   11:41 AM 06/20/2023    2:03 PM 04/10/2023   11:34 AM 12/27/2022   11:35 AM  Fall Risk   Falls in the past year? 0 0 1 1 1   Number falls in past yr:   1 0 0  Injury with Fall?   1 1 1    Caring for 3 grandchildren after daughter Rachel Duran) was killed 05/2022.  Family continues counseling.  In process of moving 66yo autistic grandson Rachel Duran to residential process.  They are very involved in church [facility]).    S/p partial R knee replacement with PF arthroplasty (Dr August Saucer) 06/01/2020 then rpt arthroscopy with debridement of inferior patellar pole spur 03/2021.   12/2021 - complex neck surgery by Dr Lovell Sheehan for C5/6 fracture subluxation after fall in prior cervical fusion. Seeing [facility] for persistent cervical radiculopathy after fusion.   Limited systemic sclerosis/scleroderma followed by rheum Dr Corliss Skains last seen last month. Also sees pulm and cards.    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 Receiving injections q8 wks. This is very expensive.   Opioid induced  constipation managed with movantik, linzess, miralax.  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  GERD managed with pantoprazole Sees GI for all of this.    Preventative: COLONOSCOPY 02/2018 - done for positive cologuard - WNL, rpt 10 yrs Russella Dar).  Well woman yearly with Dr Conley Simmonds OBGYN last seen 04/2023 with normal pap smear 04/2021.  [facility]  DEXA - 2014 with mild osteopenia per GYN DEXA - 04/2019 stable osteopenia (T -2.0).  DEXA 09/2021 osteopenia (T -2.4 R total femur).  Rpt scheduled 12/2023.  Doing ok with calcium and vitamin D intake, weight bearing exercises are very limited.  Lung cancer screening - not eligible  Flu shot - yearly COVID vaccine Moderna 08/2019, 09/2019, booster 03/2020 may get bivalent   Pneumovax 2011, prevnar-13 2015 Td 2007, Td 10/2016 RSV - to consider  Shingrix 06/2017, 10/2017  Advanced care planning - HCPOA scanned into chart 06/2015. HCPOA is husband Rachel Duran. Does not want prolonged life support if terminally ill.  Seat belt use discussed.  Sunscreen use discussed. No changing moles. Sees derm yearly Emily Filbert).  Non smoker  Alcohol - none  Dentist q6 mo  Eye exam frequently Bowel - chronic constipation -  on movantik with good effect -25mg  daily (12.5mg  not effective).  Bladder - no incontinence, known IC followed yearly by Dr Logan Bores, continues PRN pyridium and bladder instillation treatments.    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 Interim medical history since our last visit reviewed. Allergies and medications reviewed and updated. Outpatient Medications Prior to Visit  Medication Sig Dispense Refill   ALPRAZolam (XANAX) 1 MG  tablet Take 0.5 tablets (0.5 mg total) by mouth at bedtime. 30 tablet 0   amLODipine (NORVASC) 2.5 MG tablet TAKE 1 TABLET BY MOUTH DAILY 90 tablet 0   amLODipine (NORVASC) 5 MG tablet Take 1 tablet (5 mg total) by mouth daily. 90 tablet 1   amphetamine-dextroamphetamine (ADDERALL XR) 30 MG 24 hr capsule TAKE 1 CAPSULE BY MOUTH EVERY MORNING 90 capsule 0   ARIPiprazole (ABILIFY) 5 MG tablet Take 5 mg by mouth at bedtime.     Calcium Carbonate-Vitamin D (CALTRATE 600+D PO) Take 2 tablets by mouth daily.     clindamycin (CLEOCIN) 150 MG capsule Take 600mg  1 hour prior to dental procedure 10 capsule 0   cyanocobalamin (VITAMIN B12) 1000 MCG/ML injection INJECT INTO THE MUSCLE EVERY 21 DAYSAS DIRECTED 4 mL 3   Eszopiclone 3 MG TABS TAKE 1 TABLET BY MOUTH AT BEDTIME (TAKE IMMEDIATLEY BEFORE BEDTIME) 90 tablet 0   ferrous sulfate 325 (65 FE) MG tablet Take 1 tablet (325 mg total) by mouth every Monday, Wednesday, and Friday. 30 tablet 3   fluticasone (FLONASE) 50 MCG/ACT nasal spray Place 2 sprays into both nostrils daily. 16 g 6   lamoTRIgine (LAMICTAL) 200 MG tablet Take 1 tablet (200 mg total) by mouth daily. 30 tablet 0   lidocaine (LIDODERM) 5 % Place 3 patches onto the skin daily. Remove & Discard patch within 12 hours or as directed by MD 60 patch 0   lidocaine (XYLOCAINE) 2 % solution Use as directed 15 mLs in the mouth or throat every 4 (four) hours as needed for mouth pain. 100 mL 0   lidocaine (XYLOCAINE) 5 % ointment Apply 1 Application topically as needed. Apply up to 4x/day- on LUE for nerve pain 50 g 6   linaclotide (LINZESS) 290 MCG CAPS capsule Take 1 capsule (290 mcg total) by mouth daily. 90 capsule 2   morphine (MSIR) 15 MG tablet TAKE ONE TABLET BY MOUTH EVERY 6 HOURS AS NEEDED FOR SEVERE PAIN 90 tablet 0   naloxegol oxalate (MOVANTIK) 25 MG TABS tablet Take 1 tablet (25 mg total) by mouth daily. 90 tablet 3   NONFORMULARY OR COMPOUNDED ITEM Testosterone proprionate petrolatum  (jar) 2% ointment  2 Grams S: Apply a small amount once daily x 5 days per week.  [facility]. 2 each 3   ondansetron (ZOFRAN) 4 MG tablet TAKE 1 TABLET BY MOUTH EVERY 8 HOURS AS NEEDED FOR NAUSEA AND VOMITING 30 tablet 0   pantoprazole (PROTONIX) 40 MG tablet TAKE ONE TABLET BY MOUTH TWICE DAILY WITH MEALS 180 tablet 3   phenazopyridine (PYRIDIUM) 100 MG tablet Take 200 mg by mouth 3 (three) times daily as needed.     polyethylene glycol (MIRALAX / GLYCOLAX) 17 g packet Take 17 g by mouth 2 (two) times daily.     Prasterone (INTRAROSA) 6.5  MG INST UNWRAP AND INSERT 1 SUPPOSITORY  VAGINALLY WITH PROVIDED  APPLICATOR AT BEDTIME 90 each 3   pregabalin (LYRICA) 150 MG capsule Take 150 mg by mouth 2 (two) times daily.     tiZANidine (ZANAFLEX) 2 MG tablet TAKE 1 TABLET BY MOUTH EVERY 8 HOURS AS NEEDED 30 tablet 0   ursodiol (ACTIGALL) 300 MG capsule Take 1 capsule (300 mg total) by mouth 2 (two) times daily. 180 capsule 3   No facility-administered medications prior to visit.     Per HPI unless specifically indicated in ROS section below Review of Systems  Constitutional:  Negative for activity change, appetite change, chills, fatigue, fever and unexpected weight change.  HENT:  Negative for hearing loss.   Eyes:  Negative for visual disturbance.  Respiratory:  Negative for cough, chest tightness, shortness of breath and wheezing.   Cardiovascular:  Negative for chest pain, palpitations and leg swelling.  Gastrointestinal:  Positive for constipation. Negative for abdominal distention, abdominal pain, blood in stool, diarrhea, nausea and vomiting.  Genitourinary:  Negative for difficulty urinating and hematuria.  Musculoskeletal:  Negative for arthralgias, myalgias and neck pain.  Skin:  Negative for rash.  Neurological:  Negative for dizziness, seizures, syncope and headaches.  Hematological:  Negative for adenopathy. Bruises/bleeds easily.  Psychiatric/Behavioral:  Positive for  dysphoric mood. The patient is nervous/anxious.     Objective:  BP (!) 146/78   Pulse 81   Temp 97.9 F (36.6 C) (Oral)   Ht 5' 2.5" (1.588 m)   Wt 122 lb (55.3 kg)   LMP 07/25/1998   SpO2 98%   BMI 21.96 kg/m   Wt Readings from Last 3 Encounters:  10/10/23 122 lb (55.3 kg)  09/20/23 120 lb 6.4 oz (54.6 kg)  08/28/23 118 lb (53.5 kg)      Physical Exam Vitals and nursing note reviewed.  Constitutional:      Appearance: Normal appearance. She is not ill-appearing.  HENT:     Head: Normocephalic and atraumatic.     Right Ear: Tympanic membrane, ear canal and external ear normal. There is no impacted cerumen.     Left Ear: Tympanic membrane, ear canal and external ear normal. There is no impacted cerumen.     Mouth/Throat:     Mouth: Mucous membranes are moist.     Pharynx: Oropharynx is clear. No oropharyngeal exudate or posterior oropharyngeal erythema.  Eyes:     General:        Right eye: No discharge.        Left eye: No discharge.     Extraocular Movements: Extraocular movements intact.     Conjunctiva/sclera: Conjunctivae normal.     Pupils: Pupils are equal, round, and reactive to light.  Neck:     Thyroid: No thyroid mass or thyromegaly.     Vascular: No carotid bruit.  Cardiovascular:     Rate and Rhythm: Normal rate and regular rhythm.     Pulses: Normal pulses.     Heart sounds: Normal heart sounds. No murmur heard. Pulmonary:     Effort: Pulmonary effort is normal. No respiratory distress.     Breath sounds: Normal breath sounds. No wheezing, rhonchi or rales.  Abdominal:     General: Bowel sounds are normal. There is no distension.     Palpations: Abdomen is soft. There is no mass.     Tenderness: There is no abdominal tenderness. There is no guarding or rebound.     Hernia: No hernia is  present.  Musculoskeletal:     Cervical back: Normal range of motion and neck supple. No rigidity.     Right lower leg: No edema.     Left lower leg: No edema.   Lymphadenopathy:     Cervical: No cervical adenopathy.  Skin:    General: Skin is warm and dry.     Findings: No rash.  Neurological:     General: No focal deficit present.     Mental Status: She is alert. Mental status is at baseline.     Comments:  Recall 3/3 Calculation 5/5 DLROW   Psychiatric:        Mood and Affect: Mood normal.        Behavior: Behavior normal.       Results for orders placed or performed during the hospital encounter of 07/30/23  POCT rapid strep A   Collection Time: 07/30/23 12:59 PM  Result Value Ref Range   Rapid Strep A Screen Negative Negative  POC Covid19/Flu A&B Antigen   Collection Time: 07/30/23 12:59 PM  Result Value Ref Range   Influenza A Antigen, POC Negative Negative   Influenza B Antigen, POC Negative Negative   Covid Antigen, POC Negative Negative   *Note: Due to a large number of results and/or encounters for the requested time period, some results have not been displayed. A complete set of results can be found in Results Review.    Assessment & Plan:   Problem List Items Addressed This Visit     Medicare annual wellness visit, subsequent - Primary (Chronic)   I have personally reviewed the Medicare Annual Wellness questionnaire and have noted 1. The patient's medical and social history 2. Their use of alcohol, tobacco or illicit drugs 3. Their current medications and supplements 4. The patient's functional ability including ADL's, fall risks, home safety risks and hearing or visual impairment. Cognitive function has been assessed and addressed as indicated.  5. Diet and physical activity 6. Evidence for depression or mood disorders The patients weight, height, BMI have been recorded in the chart. I have made referrals, counseling and provided education to the patient based on review of the above and I have provided the pt with a written personalized care plan for preventive services. Provider list updated.. See scanned  questionairre as needed for further documentation. Reviewed preventative protocols and updated unless pt declined.       Health maintenance examination (Chronic)   Preventative protocols reviewed and updated unless pt declined. Discussed healthy diet and lifestyle.       Encounter for chronic pain management (Chronic)   [place] CSRS reviewed      Advanced directives, counseling/discussion (Chronic)   Previously discussed      Insomnia   Chronic, stable period on Lunesta nightly       MDD (major depressive disorder), recurrent episode, moderate (HCC)   Regularly sees psychiatry on xanax lamictal and abilify.  We prescribe Adderall XR.       GERD   Continues pantoprazole BID through GI.       Fibromyalgia   CFS (chronic fatigue syndrome)   On adderall XR through our office      Relevant Orders   TSH   CBC with Differential/Platelet   Vitamin B12   Prediabetes   Update A1c when she returns for labs       Relevant Orders   Hemoglobin A1c   Stargardt's disease   Regularly sees Dr Allyne Gee retinologist receiving now bilateral eye injections.  Iron deficiency   Update levels on MWF replacement.      Relevant Orders   Ferritin   IBC panel   Dyslipidemia   Update levels off statin. The 10-year ASCVD risk score (Arnett DK, et al., 2019) is: 7.9%   Values used to calculate the score:     Age: 14 years     Sex: Female     Is Non-Hispanic African American: No     Diabetic: No     Tobacco smoker: No     Systolic Blood Pressure: 146 mmHg     Is BP treated: Yes     HDL Cholesterol: 76.1 mg/dL     Total Cholesterol: 177 mg/dL       Relevant Orders   Lipid panel   Comprehensive metabolic panel   TSH   Limited systemic sclerosis [facility])   Regularly sees rheumatology, also followed by pulmonology (pending updated PFTs) and cardiology      Osteopenia   Planned upcoming DEXA 12/2023.  Continue calcium, vit D. Weight bearing are exercises limited        Relevant Orders   VITAMIN D 25 Hydroxy (Vit-D Deficiency, Fractures)   Elevated blood pressure reading in office without diagnosis of hypertension   BP elevated today - will continue to monitor without med changes. She is already on amlodipine for h/o raynaud's through rheum.         No orders of the defined types were placed in this encounter.   Orders Placed This Encounter  Procedures   Hemoglobin A1c    Standing Status:   Future    Expiration Date:   10/09/2024   Ferritin    Standing Status:   Future    Expiration Date:   10/09/2024   IBC panel    Standing Status:   Future    Expiration Date:   10/09/2024   Lipid panel    Standing Status:   Future    Expiration Date:   10/09/2024   Comprehensive metabolic panel    Standing Status:   Future    Expiration Date:   10/09/2024   TSH    Standing Status:   Future    Expiration Date:   10/09/2024   CBC with Differential/Platelet    Standing Status:   Future    Expiration Date:   10/09/2024   Vitamin B12    Standing Status:   Future    Expiration Date:   10/09/2024   VITAMIN D 25 Hydroxy (Vit-D Deficiency, Fractures)    Standing Status:   Future    Expiration Date:   10/09/2024    Patient Instructions  Return at your convenience for fasting labs (lab visit only).  Good to see you today Continue current medicines.  Return in 3 months for follow up visit (chronic pain)  Follow up plan: Return in about 3 months (around 01/10/2024) for follow up visit.  Eustaquio Boyden, MD

## 2023-10-10 NOTE — Assessment & Plan Note (Signed)

## 2023-10-10 NOTE — Assessment & Plan Note (Signed)
 Regularly sees Dr Allyne Gee retinologist receiving now bilateral eye injections.

## 2023-10-10 NOTE — Assessment & Plan Note (Signed)
 Regularly sees psychiatry on xanax lamictal and abilify.  We prescribe Adderall XR.

## 2023-10-10 NOTE — Assessment & Plan Note (Signed)
Update levels on MWF replacement.  

## 2023-10-10 NOTE — Assessment & Plan Note (Signed)
 Regularly sees rheumatology, also followed by pulmonology (pending updated PFTs) and cardiology

## 2023-10-10 NOTE — Patient Instructions (Addendum)
 Return at your convenience for fasting labs (lab visit only).  Good to see you today Continue current medicines.  Return in 3 months for follow up visit (chronic pain)

## 2023-10-10 NOTE — Assessment & Plan Note (Signed)
Update A1c when she returns for labs

## 2023-10-10 NOTE — Assessment & Plan Note (Signed)
 Planned upcoming DEXA 12/2023.  Continue calcium, vit D. Weight bearing are exercises limited

## 2023-10-10 NOTE — Assessment & Plan Note (Signed)
 [place] CS[place] reviewed

## 2023-10-10 NOTE — Assessment & Plan Note (Signed)
 Update levels off statin. The 10-year ASCVD risk score (Arnett DK, et al., 2019) is: 7.9%   Values used to calculate the score:     Age: 68 years     Sex: Female     Is Non-Hispanic African American: No     Diabetic: No     Tobacco smoker: No     Systolic Blood Pressure: 146 mmHg     Is BP treated: Yes     HDL Cholesterol: 76.1 mg/dL     Total Cholesterol: 177 mg/dL

## 2023-10-10 NOTE — Assessment & Plan Note (Signed)
 Previously discussed.

## 2023-10-11 ENCOUNTER — Other Ambulatory Visit: Payer: Self-pay | Admitting: Obstetrics and Gynecology

## 2023-10-11 DIAGNOSIS — Z0000 Encounter for general adult medical examination without abnormal findings: Secondary | ICD-10-CM

## 2023-10-17 ENCOUNTER — Encounter (HOSPITAL_BASED_OUTPATIENT_CLINIC_OR_DEPARTMENT_OTHER): Payer: Medicare Other

## 2023-10-18 ENCOUNTER — Encounter: Payer: Self-pay | Admitting: Pulmonary Disease

## 2023-10-18 ENCOUNTER — Ambulatory Visit (INDEPENDENT_AMBULATORY_CARE_PROVIDER_SITE_OTHER): Payer: Medicare Other | Admitting: Pulmonary Disease

## 2023-10-18 VITALS — BP 120/70 | HR 91 | Temp 97.8°F | Ht 62.5 in | Wt 122.2 lb

## 2023-10-18 DIAGNOSIS — M349 Systemic sclerosis, unspecified: Secondary | ICD-10-CM | POA: Diagnosis not present

## 2023-10-18 DIAGNOSIS — R911 Solitary pulmonary nodule: Secondary | ICD-10-CM

## 2023-10-18 NOTE — Patient Instructions (Signed)
 VISIT SUMMARY:  Rachel Duran, a 68 year old female with systemic sclerosis, visited today due to an intermittent productive cough, particularly triggered by consuming cold foods or drinks. She has a history of systemic sclerosis and [name] phenomenon, for which she is currently taking Norvasc. Recent imaging showed bronchitic changes, and she is being monitored for potential lung involvement.  YOUR PLAN:  -COUGH: Your cough is intermittent and productive, often triggered by cold foods or drinks, and can last up to a day. The sputum varies from clear to yellow-green. A recent chest x-ray showed signs of bronchitic changes, which means there is inflammation in your airways. We will order a chest CT scan to get a better look at these changes and decide on the next steps based on the results.  -SYSTEMIC SCLEROSIS WITH [name] PHENOMENON: Systemic sclerosis is a condition that causes skin tightening and can affect internal organs. You also have [name] phenomenon, which causes some areas of your body, like your fingers, to feel numb and cold in response to cold temperatures or stress. You are currently taking Norvasc to manage [name]. We will continue this medication and schedule a lung function test in six months to monitor for any potential lung involvement.  INSTRUCTIONS:  Please schedule a chest CT scan as soon as possible to evaluate the bronchitic changes. Additionally, we will need to schedule a lung function test in six months to monitor your systemic sclerosis. Continue taking Norvasc as prescribed for [name] phenomenon.

## 2023-10-18 NOTE — Progress Notes (Signed)
 Rachel Duran    [id number]    09-28-1955  Primary Care Physician:Gutierrez, Wynona Canes, MD  Referring Physician: Eustaquio Boyden, MD [address] [place],  [place] [address]  Chief complaint: Follow-up for ILD evaluation  HPI: 68 y.o.  with history of [name], fibromyalgia with recent diagnosis of scleroderma Referred for evaluation of interstitial lung disease.  She has a strong family history of scleroderma and longstanding [name] phenomena.  More recently she has developed skin thickening over the digits and findings of  capillary dilation and dropout on dermatology evaluation supporting a diagnosis of systemic sclerosis.  She has history of dysphagia and follows with Dr. Russella Dar, gastroenterology.  Underwent esophageal dilatation around 2018.  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  She has also been referred to Dr. Gala Romney for evaluation of pulmonary hypertension She was evaluated by Dr. Gala Romney with no evidence of pulmonary hypertension on echocardiogram. 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  No mold, hot tub, Jacuzzi Smoking history: Never smoker Travel history: Originally from [place].  No significant recent travel Relevant family history: Mother had scleroderma with interstitial lung disease and pulmonary hypertension.  Interim history: Discussed the use of AI scribe software for clinical note transcription with the patient, who gave verbal consent to proceed.  History of Present Illness Rachel Duran "Rachel Duran" is a 68 year old female with systemic sclerosis who presents with a cough.  She has developed an intermittent cough, particularly triggered by consuming cold foods or drinks with ice. The cough is productive, with expectoration  that varies from clear to yellow or green and thicker. Episodes can last for an hour or two and may extend into the next day. No shortness of breath, and breathing is otherwise normal. Her last CT scan was in April 2024, and a chest x-ray in November showed some bronchitic changes, which was performed due to the cough.  She has a history of systemic sclerosis, also known as limited systemic sclerosis, and is being monitored for potential lung involvement. She experiences skin tightening, particularly in the afternoon, which affects her fingers. Her mother had scleroderma.  She also has [name] phenomenon and is taking Norvasc for this condition.    Outpatient Encounter Medications as of 10/18/2023  Medication Sig   ALPRAZolam (XANAX) 1 MG tablet Take 0.5 tablets (0.5 mg total) by mouth at bedtime.   amLODipine (NORVASC) 2.5 MG tablet TAKE 1 TABLET BY MOUTH DAILY   amLODipine (NORVASC) 5 MG tablet Take 1 tablet (5 mg total) by mouth daily.   amphetamine-dextroamphetamine (ADDERALL XR) 30 MG 24 hr capsule TAKE 1 CAPSULE BY MOUTH EVERY MORNING   ARIPiprazole (ABILIFY) 5 MG tablet Take 5 mg by mouth at bedtime.   Calcium Carbonate-Vitamin D (CALTRATE 600+D PO) Take 2 tablets by mouth daily.   clindamycin (CLEOCIN) 150 MG capsule Take 600mg  1 hour prior to dental procedure   cyanocobalamin (VITAMIN B12) 1000 MCG/ML injection INJECT INTO THE MUSCLE EVERY 21 DAYSAS DIRECTED   Eszopiclone 3 MG TABS TAKE 1 TABLET BY MOUTH AT BEDTIME (TAKE IMMEDIATLEY BEFORE BEDTIME)   ferrous sulfate 325 (65 FE) MG tablet Take 1 tablet (325 mg total) by mouth every Monday, Wednesday, and Friday.   fluticasone (FLONASE) 50 MCG/ACT nasal spray Place  2 sprays into both nostrils daily.   lamoTRIgine (LAMICTAL) 200 MG tablet Take 1 tablet (200 mg total) by mouth daily.   lidocaine (LIDODERM) 5 % Place 3 patches onto the skin daily. Remove & Discard patch within 12 hours or as directed by MD   lidocaine (XYLOCAINE) 2 %  solution Use as directed 15 mLs in the mouth or throat every 4 (four) hours as needed for mouth pain.   lidocaine (XYLOCAINE) 5 % ointment Apply 1 Application topically as needed. Apply up to 4x/day- on LUE for nerve pain   linaclotide (LINZESS) 290 MCG CAPS capsule Take 1 capsule (290 mcg total) by mouth daily.   morphine (MSIR) 15 MG tablet TAKE ONE TABLET BY MOUTH EVERY 6 HOURS AS NEEDED FOR SEVERE PAIN   naloxegol oxalate (MOVANTIK) 25 MG TABS tablet Take 1 tablet (25 mg total) by mouth daily.   NONFORMULARY OR COMPOUNDED ITEM Testosterone proprionate petrolatum (jar) 2% ointment  2 Grams S: Apply a small amount once daily x 5 days per week.  [facility].   ondansetron (ZOFRAN) 4 MG tablet TAKE 1 TABLET BY MOUTH EVERY 8 HOURS AS NEEDED FOR NAUSEA AND VOMITING   pantoprazole (PROTONIX) 40 MG tablet TAKE ONE TABLET BY MOUTH TWICE DAILY WITH MEALS   phenazopyridine (PYRIDIUM) 100 MG tablet Take 200 mg by mouth 3 (three) times daily as needed.   polyethylene glycol (MIRALAX / GLYCOLAX) 17 g packet Take 17 g by mouth 2 (two) times daily.   Prasterone (INTRAROSA) 6.5 MG INST UNWRAP AND INSERT 1 SUPPOSITORY  VAGINALLY WITH PROVIDED  APPLICATOR AT BEDTIME   pregabalin (LYRICA) 150 MG capsule TAKE 1 CAPSULE BY MOUTH EVERY MORNING AT NOON AND AT BEDTIME   tiZANidine (ZANAFLEX) 2 MG tablet TAKE 1 TABLET BY MOUTH EVERY 8 HOURS AS NEEDED   ursodiol (ACTIGALL) 300 MG capsule Take 1 capsule (300 mg total) by mouth 2 (two) times daily.   No facility-administered encounter medications on file as of 10/18/2023.   Physical Exam: Blood pressure 120/70, pulse 91, temperature 97.8 F (36.6 C), temperature source Oral, height 5' 2.5" (1.588 m), weight 122 lb 3.2 oz (55.4 kg), last menstrual period 07/25/1998, SpO2 96%. Gen:      No acute distress HEENT:  EOMI, sclera anicteric Neck:     No masses; no thyromegaly Lungs:    Clear to auscultation bilaterally; normal respiratory effort CV:         Regular  rate and rhythm; no murmurs Abd:      + bowel sounds; soft, non-tender; no palpable masses, no distension Ext:    No edema; adequate peripheral perfusion Skin:      Warm and dry; no rash Neuro: alert and oriented x 3 Psych: normal mood and affect   Data Reviewed: Imaging: Chest x-ray 10/18/2016-no acute cardiopulmonary abnormality.   High-resolution CT 05/08/2020-no evidence of fibrotic interstitial lung disease, mild tubular bronchiectasis, air trapping, 6 mm nodule in the right pulmonary apex.  Aortic atherosclerosis. CT chest without contrast 05/01/21- no evidence of ILD, pulmonary nodule stable CT Cardiac 09/17/21-visualized lung images do not show any abnormalities. High resolution CT 11/09/2022-stable right upper lobe pulmonary nodule, aortic, coronary atherosclerosis.  No evidence of interstitial lung disease Chest x-ray 07/03/2023-bronchiectasis, chronic changes, reticulonodular opacities. I have reviewed the images personally.  PFTs: 07/07/2020 FVC 3.18 [96%], FEV1 2.63 [103%], F/F 83, TLC 5.20 [101%], DLCO 17.43 [85%] Normal test  Cardiac: Echocardiogram 06/22/2020 LVEF 55-60%, grade 1 diastolic dysfunction, normal RV systolic size and  function.  Assessment & Plan Cough Intermittent productive cough triggered by cold foods or drinks, lasting up to a day. Sputum varies from clear to yellow-green. Recent chest x-ray showed bronchitic changes, indicating airway inflammation. Differential includes exposure-related inflammation or asthma propensity. No current inhaler use, and symptoms are intermittent.  - Order chest CT to evaluate bronchitic changes - Consider follow-up based on CT findings  Systemic sclerosis with [name] phenomenon Limited systemic sclerosis with associated [name] phenomenon. Managed with Norvasc for [name]. Previous lung evaluations showed no interstitial lung disease. Skin tightening more prevalent in the afternoon. Family history of scleroderma  noted.  - Continue Norvasc for [name] management - Review CT scan - Schedule lung function test in six months  Lung nodule Follow-up CT reviewed with stable lung nodule. This is likely benign in a non-smoker.  Does not need annual follow-up  Plan/Recommendations: High resolution CT, PFTs in 6 months  Chilton Greathouse MD [facility] 10/18/2023, 3:16 PM  CC: Eustaquio Boyden, MD

## 2023-10-23 ENCOUNTER — Other Ambulatory Visit

## 2023-10-23 ENCOUNTER — Other Ambulatory Visit (INDEPENDENT_AMBULATORY_CARE_PROVIDER_SITE_OTHER)

## 2023-10-23 ENCOUNTER — Ambulatory Visit
Admission: RE | Admit: 2023-10-23 | Discharge: 2023-10-23 | Disposition: A | Discharge status: Home / Self Care | Source: Ambulatory Visit | Attending: Pulmonary Disease | Admitting: Pulmonary Disease

## 2023-10-23 ENCOUNTER — Telehealth: Payer: Self-pay | Admitting: Family Medicine

## 2023-10-23 DIAGNOSIS — M85851 Other specified disorders of bone density and structure, right thigh: Secondary | ICD-10-CM

## 2023-10-23 DIAGNOSIS — E611 Iron deficiency: Secondary | ICD-10-CM | POA: Diagnosis not present

## 2023-10-23 DIAGNOSIS — E785 Hyperlipidemia, unspecified: Secondary | ICD-10-CM | POA: Diagnosis not present

## 2023-10-23 DIAGNOSIS — G9332 Myalgic encephalomyelitis/chronic fatigue syndrome: Secondary | ICD-10-CM | POA: Diagnosis not present

## 2023-10-23 DIAGNOSIS — R7303 Prediabetes: Secondary | ICD-10-CM

## 2023-10-23 DIAGNOSIS — M349 Systemic sclerosis, unspecified: Secondary | ICD-10-CM

## 2023-10-23 DIAGNOSIS — R911 Solitary pulmonary nodule: Secondary | ICD-10-CM

## 2023-10-23 LAB — CBC WITH DIFFERENTIAL/PLATELET
Basophils Absolute: 0.1 10*3/uL (ref 0.0–0.1)
Basophils Relative: 0.8 % (ref 0.0–3.0)
Eosinophils Absolute: 0.3 10*3/uL (ref 0.0–0.7)
Eosinophils Relative: 3.4 % (ref 0.0–5.0)
HCT: 38.4 % (ref 36.0–46.0)
Hemoglobin: 12.8 g/dL (ref 12.0–15.0)
Lymphocytes Relative: 26.5 % (ref 12.0–46.0)
Lymphs Abs: 2 10*3/uL (ref 0.7–4.0)
MCHC: 33.2 g/dL (ref 30.0–36.0)
MCV: 86.1 fl (ref 78.0–100.0)
Monocytes Absolute: 0.6 10*3/uL (ref 0.1–1.0)
Monocytes Relative: 7.6 % (ref 3.0–12.0)
Neutro Abs: 4.6 10*3/uL (ref 1.4–7.7)
Neutrophils Relative %: 61.7 % (ref 43.0–77.0)
Platelets: 378 10*3/uL (ref 150.0–400.0)
RBC: 4.46 Mil/uL (ref 3.87–5.11)
RDW: 14 % (ref 11.5–15.5)
WBC: 7.5 10*3/uL (ref 4.0–10.5)

## 2023-10-23 LAB — COMPREHENSIVE METABOLIC PANEL WITH GFR
ALT: 8 U/L (ref 0–35)
AST: 16 U/L (ref 0–37)
Albumin: 4.3 g/dL (ref 3.5–5.2)
Alkaline Phosphatase: 111 U/L (ref 39–117)
BUN: 10 mg/dL (ref 6–23)
CO2: 29 meq/L (ref 19–32)
Calcium: 9.1 mg/dL (ref 8.4–10.5)
Chloride: 99 meq/L (ref 96–112)
Creatinine, Ser: 0.62 mg/dL (ref 0.40–1.20)
GFR: 93.24 mL/min (ref >60.00)
Glucose, Bld: 104 mg/dL — ABNORMAL HIGH (ref 70–99)
Potassium: 5 meq/L (ref 3.5–5.1)
Sodium: 135 meq/L (ref 135–145)
Total Bilirubin: 0.4 mg/dL (ref 0.2–1.2)
Total Protein: 6.5 g/dL (ref 6.0–8.3)

## 2023-10-23 LAB — LIPID PANEL
Cholesterol: 185 mg/dL (ref 0–200)
HDL: 73.9 mg/dL (ref >39.00)
LDL Cholesterol: 96 mg/dL (ref 0–99)
NonHDL: 111.5
Total CHOL/HDL Ratio: 3
Triglycerides: 78 mg/dL (ref 0.0–149.0)
VLDL: 15.6 mg/dL (ref 0.0–40.0)

## 2023-10-23 LAB — IBC PANEL
Iron: 169 ug/dL — ABNORMAL HIGH (ref 42–145)
Saturation Ratios: 46.3 % (ref 20.0–50.0)
TIBC: 365.4 ug/dL (ref 250.0–450.0)
Transferrin: 261 mg/dL (ref 212.0–360.0)

## 2023-10-23 LAB — VITAMIN D 25 HYDROXY (VIT D DEFICIENCY, FRACTURES): VITD: 50.56 ng/mL (ref 30.00–100.00)

## 2023-10-23 LAB — VITAMIN B12: Vitamin B-12: 814 pg/mL (ref 211–911)

## 2023-10-23 LAB — TSH: TSH: 2.49 u[IU]/mL (ref 0.35–5.50)

## 2023-10-23 LAB — HEMOGLOBIN A1C: Hgb A1c MFr Bld: 5.9 % (ref 4.6–6.5)

## 2023-10-23 LAB — FERRITIN: Ferritin: 47.7 ng/mL (ref 10.0–291.0)

## 2023-10-23 NOTE — Telephone Encounter (Signed)
 I already see 2 of the grandchildren. I think this is for the granddaughter, requesting to schedule new pt appt - ok to do. Thanks.

## 2023-10-23 NOTE — Telephone Encounter (Signed)
 Pt came in today and stated that Dr Reece Agar agreed to take  her grand kids on as a new pt DR G also see her other grand  kids also just checking to see if its ok too schedule them as new pt pt would like a call back

## 2023-10-24 ENCOUNTER — Encounter: Payer: Self-pay | Admitting: Family Medicine

## 2023-10-24 IMAGING — MR MR CERVICAL SPINE WO/W CM
5 of 8 series · 27 of 48 positions shown · IV contrast (gadavist)
Comparison: 10/29/2021

CLINICAL DATA: Arm weakness, left and worsening

EXAM:
MRI CERVICAL SPINE WITHOUT AND WITH CONTRAST
TECHNIQUE: Multiplanar and multiecho pulse sequences of the cervical spine, to
include the craniocervical junction and cervicothoracic junction,
were obtained without and with intravenous contrast.
CONTRAST:  6mL GADAVIST GADOBUTROL 1 MMOL/ML IV SOLN

[Series 6: t2_tse_warp_sag · sagittal · 3.0mm · 0.43mm/px · 3 of 18 slices shown]
[im 1/18]
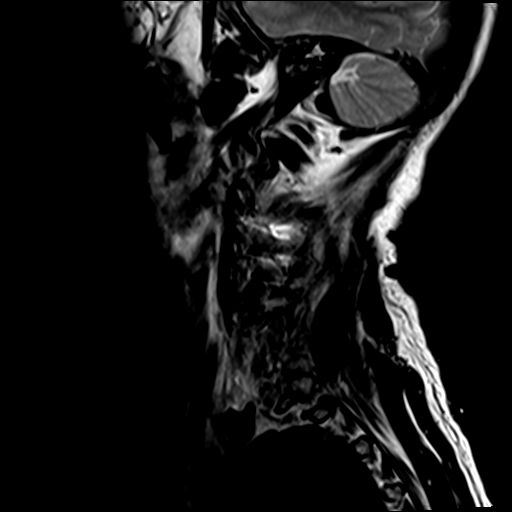
[im 9/18]
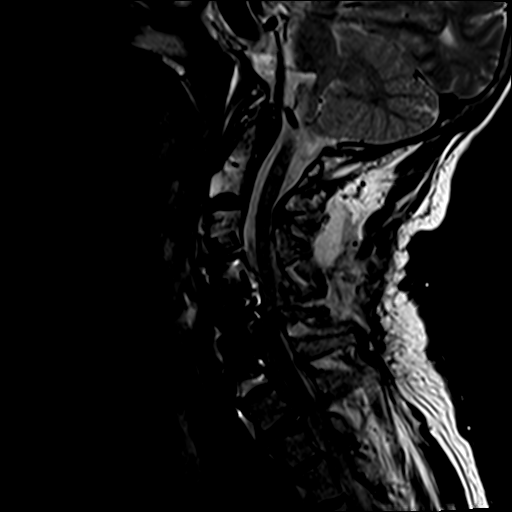
[im 18/18]
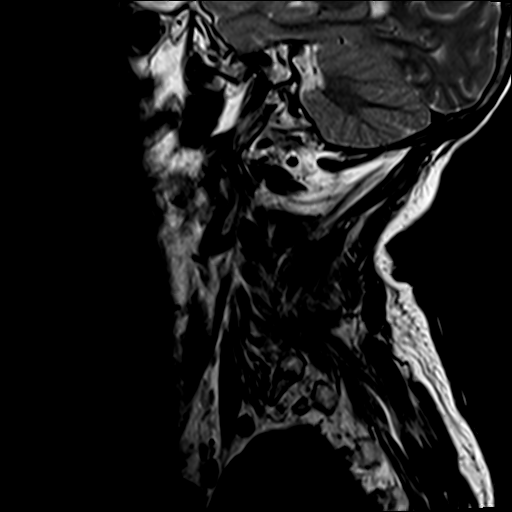

[Series 7: t1_tse_warp_sag · sagittal · 3.0mm · 0.43mm/px · 3 of 18 slices shown]
[im 1/18]
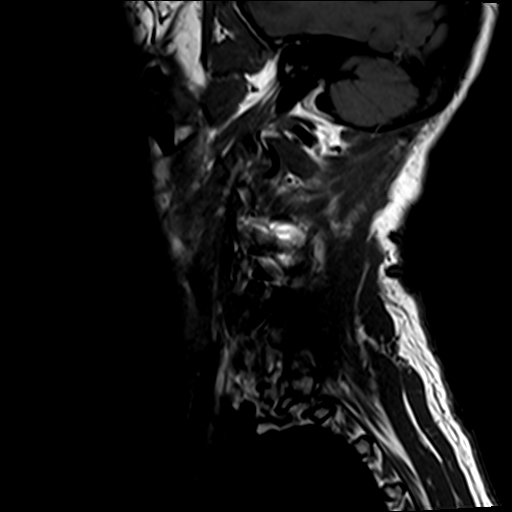
[im 9/18]
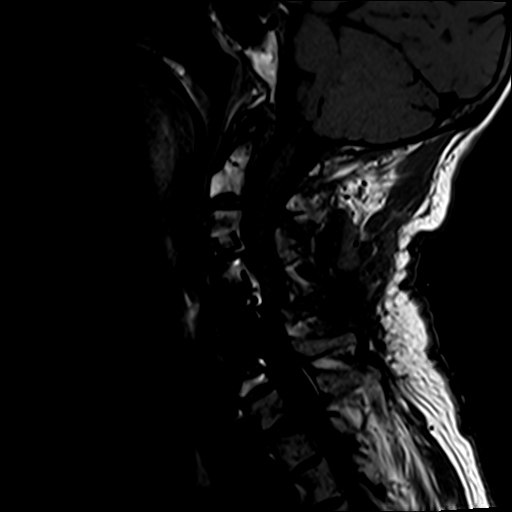
[im 18/18]
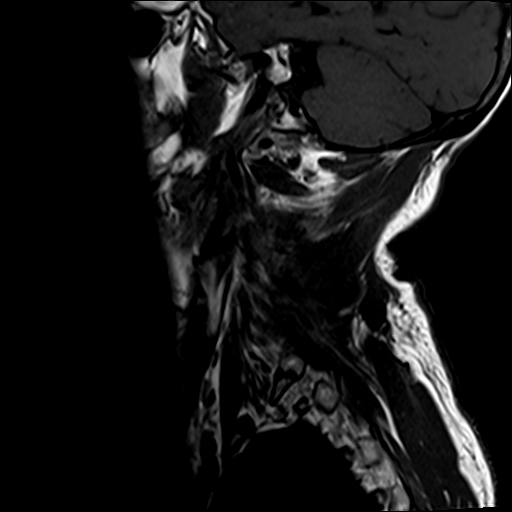

[Series 8: t2_tse_warp_tra · axial · 3.0mm · 0.78mm/px · z∈[-131,+20]mm · 9 of 50 slices shown]
[im 1/50]
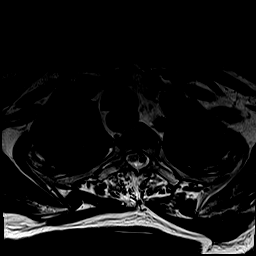
[im 7/50]
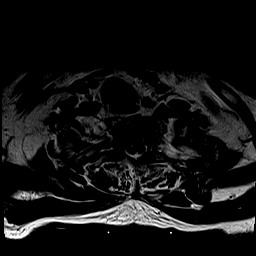
[im 13/50]
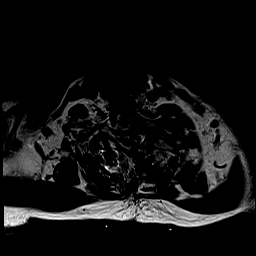
[im 19/50]
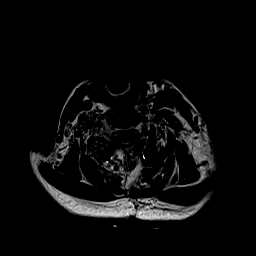
[im 25/50]
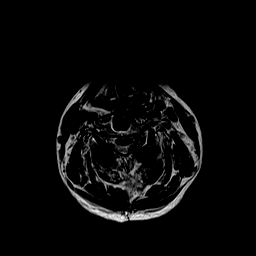
[im 31/50]
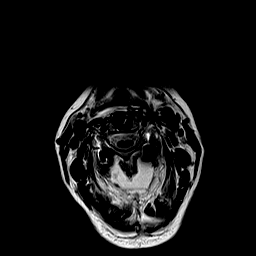
[im 37/50]
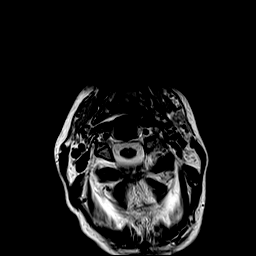
[im 43/50]
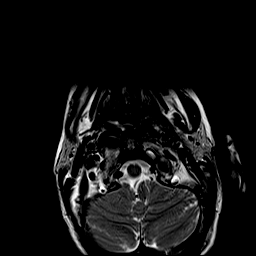
[im 50/50]
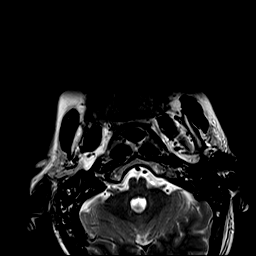

[Series 9: t1_tse_warp_tra · axial · 3.0mm · 0.39mm/px · z∈[-131,+20]mm · 9 of 50 slices shown]
[im 1/50]
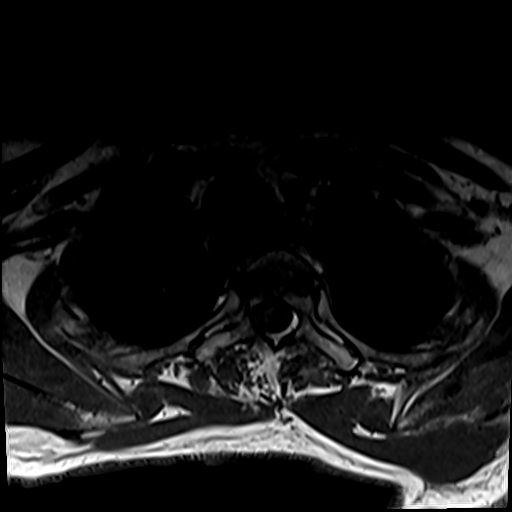
[im 7/50]
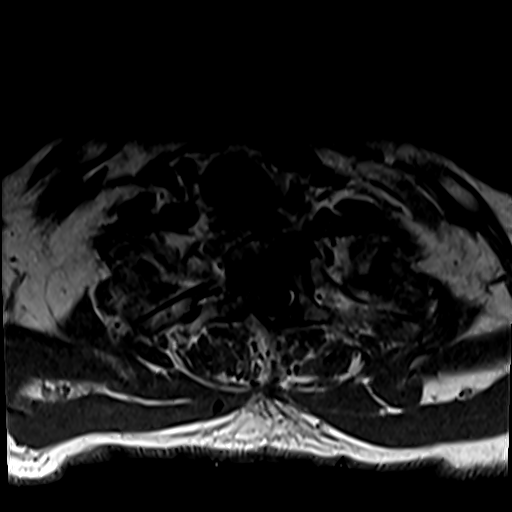
[im 13/50]
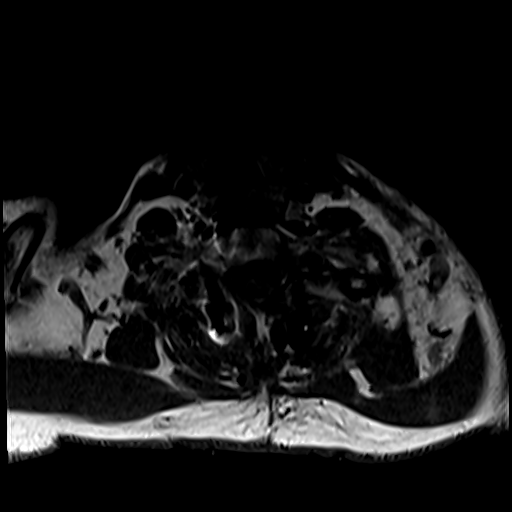
[im 19/50]
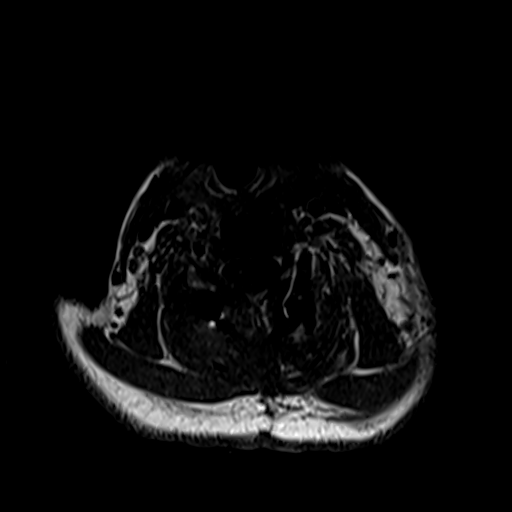
[im 25/50]
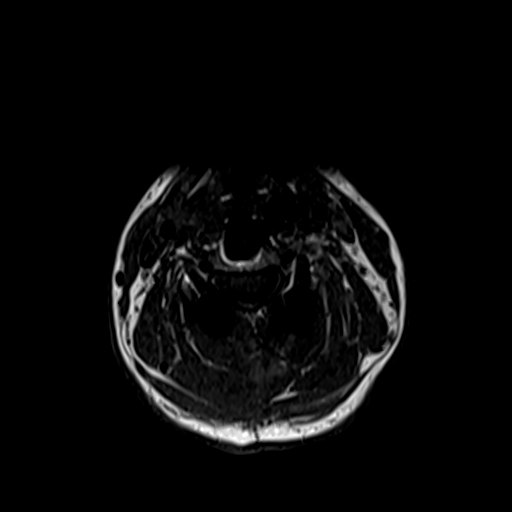
[im 31/50]
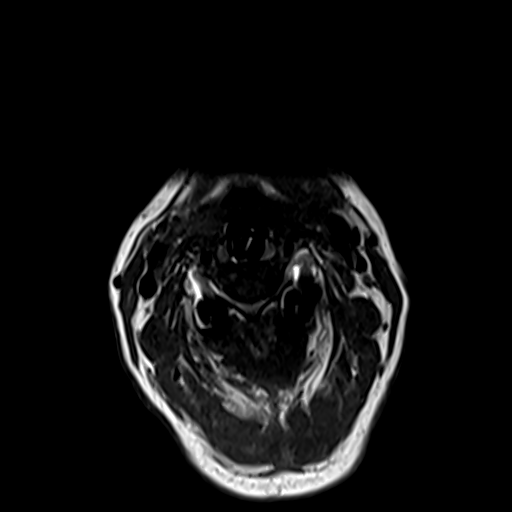
[im 37/50]
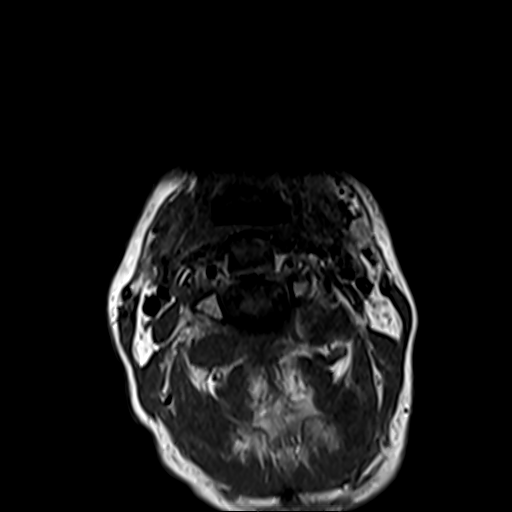
[im 43/50]
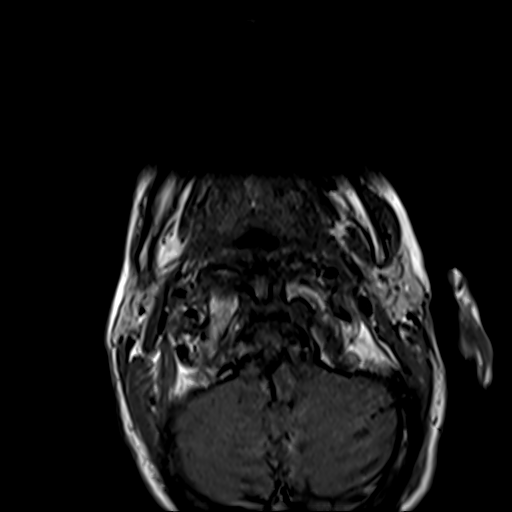
[im 50/50]
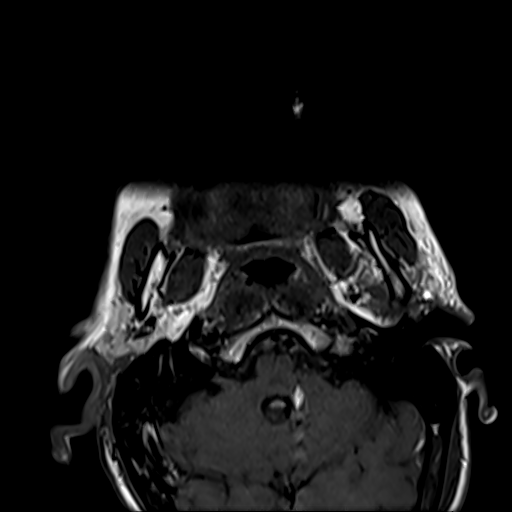

[Series 10: t2_tse_stir_warp_sag · sagittal · 3.0mm · 0.86mm/px · 3 of 18 slices shown]
[im 1/18]
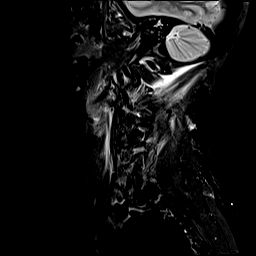
[im 9/18]
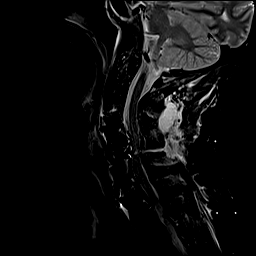
[im 18/18]
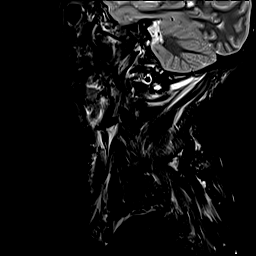

[27 of 48 positions shown; findings below may reference images not displayed]

FINDINGS: Alignment: Improved alignment after reduction of displaced cervical
spine fracture seen previously. No acute malalignment.

Vertebrae: Interval posterior fusion spanning C3-T1. Pre-existing
C3-C7 ACDF with solid arthrodesis but complicating fracture. No
evidence of acute fracture.

Cord: Normal cord signal.  No residual spinal canal hematoma seen.

Posterior Fossa, vertebral arteries, paraspinal tissues: Posterior
exposure fluid, expected. Redemonstrated C5-6 ligamentum flavum
cleft. Mild prevertebral fluid is well. Postoperative fluid do not
cause significant mass effect.

Disc levels:

There is mild posterior cord indentation due to ridging at C5-6.
Diffusely patent foramina.
IMPRESSION: 1. Interval reduction and fusion of cervical spine fracture seen
last month. No unexpected postoperative features. Resolved epidural
hematoma.
2. Mild spinal stenosis and dorsal cord indentation at C5-6.
Diffusely patent appearance of the foramina.

## 2023-10-24 NOTE — Telephone Encounter (Signed)
 Patient scheduled. Rachel Duran has to add on schedule.

## 2023-10-25 ENCOUNTER — Other Ambulatory Visit: Payer: Self-pay | Admitting: Physician Assistant

## 2023-10-25 NOTE — Telephone Encounter (Signed)
 Last Fill: 08/01/2023  Next Visit: 02/26/2024  Last Visit:  08/28/2023   Dx: [name] disease without gangrene   Current Dose per office note on 08/28/2023: manageable with amlodipine 7.5 mg daily   Okay to refill Amlodipine?

## 2023-10-30 ENCOUNTER — Encounter: Payer: Self-pay | Admitting: Family Medicine

## 2023-10-30 ENCOUNTER — Other Ambulatory Visit: Payer: Self-pay | Admitting: Family Medicine

## 2023-10-30 MED ORDER — FERROUS SULFATE 325 (65 FE) MG PO TABS: 325.0000 mg | ORAL_TABLET | ORAL | Status: AC

## 2023-11-05 ENCOUNTER — Encounter: Payer: Self-pay | Admitting: Family Medicine

## 2023-11-06 ENCOUNTER — Ambulatory Visit: Admitting: Family

## 2023-11-06 ENCOUNTER — Ambulatory Visit
Admission: RE | Admit: 2023-11-06 | Discharge: 2023-11-06 | Disposition: A | Discharge status: Home / Self Care | Source: Ambulatory Visit | Attending: Family | Admitting: Family

## 2023-11-06 ENCOUNTER — Encounter: Payer: Self-pay | Admitting: Family

## 2023-11-06 ENCOUNTER — Ambulatory Visit (INDEPENDENT_AMBULATORY_CARE_PROVIDER_SITE_OTHER)
Admission: RE | Admit: 2023-11-06 | Discharge: 2023-11-06 | Disposition: A | Discharge status: Home / Self Care | Source: Ambulatory Visit | Attending: Family | Admitting: Family

## 2023-11-06 VITALS — BP 126/82 | HR 100 | Temp 98.8°F | Ht 62.5 in | Wt 119.0 lb

## 2023-11-06 DIAGNOSIS — J0110 Acute frontal sinusitis, unspecified: Secondary | ICD-10-CM

## 2023-11-06 DIAGNOSIS — S3992XA Unspecified injury of lower back, initial encounter: Secondary | ICD-10-CM

## 2023-11-06 DIAGNOSIS — S199XXA Unspecified injury of neck, initial encounter: Secondary | ICD-10-CM

## 2023-11-06 DIAGNOSIS — S24109A Unspecified injury at unspecified level of thoracic spinal cord, initial encounter: Secondary | ICD-10-CM

## 2023-11-06 DIAGNOSIS — S0990XA Unspecified injury of head, initial encounter: Secondary | ICD-10-CM

## 2023-11-06 DIAGNOSIS — R110 Nausea: Secondary | ICD-10-CM

## 2023-11-06 MED ORDER — ONDANSETRON HCL 4 MG PO TABS: ORAL_TABLET | ORAL | 0 refills | Status: DC

## 2023-11-06 MED ORDER — DOXYCYCLINE HYCLATE 100 MG PO TABS: 100.0000 mg | ORAL_TABLET | Freq: Two times a day (BID) | ORAL | 0 refills | Status: AC

## 2023-11-06 NOTE — Assessment & Plan Note (Signed)
RX doxycycline 100 mg po bid x 10 days  Pt to continue tylenol prn sinus pain. Continue with humidifier prn and steam showers recommended as well. instructed If no symptom improvement in 48 hours please f/u

## 2023-11-06 NOTE — Telephone Encounter (Signed)
 Spoke with pt scheduling OV today at 11:40 with Tabitha. Pt expresses her thanks.

## 2023-11-06 NOTE — Assessment & Plan Note (Signed)
 Stat ct head  Discussed ER precautions  Pending results

## 2023-11-06 NOTE — Progress Notes (Signed)
 Established Patient Office Visit  Subjective:   Patient ID: Rachel Duran, female    DOB: 1955/09/11  Age: 68 y.o. MRN: [id number]  CC:  Chief Complaint  Patient presents with   Fall    Pt fell Saturday the 12th on hardwood floors, patient has had previous neck and back surgeries she would like to verify the hardware from those surgeries are still in place.     HPI: Rachel Duran is a 68 y.o. female presenting on 11/06/2023 for Fall (Pt fell Saturday the 12th on hardwood floors, patient has had previous neck and back surgeries she would like to verify the hardware from those surgeries are still in place. )  Had been sick with a virus that started last week and she was feeling flushed with nausea and headache and chills. Did have sinus pressure and pain last week but there has been some improvement. She does feel dizzy and woozy intermittently since right before her fall.   When she went to get out of the bed around 230 am and while walking to the kitchen she felt dizzy and woozy and then fell landing on her back. She thinks she might have passed out, she thinks she blacked out, and she does remember her head hitting the floor x 2. She states she also landed on her neck, upper back and bil shoulders. She does see a neurosurgeon has not seen them since the fall.   She does report a good amount of pain in her tailbone area, right side worse than left on her upper body, some associated neck pain with movement pain and limited ROM (she has always has limited ROM but states feel more stiff than normal since the fall) no nerve pains. Can move her shoulders but painful with movement. She reports pounding headache did have prior to fall but after fall even worse, no blurry vision, tenderness to scalp upon the touch.   Today she states she is nauseated and having trouble getting around due to the pain.        ROS: Negative unless specifically indicated above in HPI.   Relevant  past medical history reviewed and updated as indicated.   Allergies and medications reviewed and updated.   Current Outpatient Medications:    ALPRAZolam (XANAX) 1 MG tablet, Take 0.5 tablets (0.5 mg total) by mouth at bedtime., Disp: 30 tablet, Rfl: 0   amLODipine (NORVASC) 2.5 MG tablet, TAKE 1 TABLET BY MOUTH DAILY, Disp: 90 tablet, Rfl: 0   amLODipine (NORVASC) 5 MG tablet, Take 1 tablet (5 mg total) by mouth daily., Disp: 90 tablet, Rfl: 1   amphetamine-dextroamphetamine (ADDERALL XR) 30 MG 24 hr capsule, TAKE 1 CAPSULE BY MOUTH EVERY MORNING, Disp: 90 capsule, Rfl: 0   ARIPiprazole (ABILIFY) 5 MG tablet, Take 5 mg by mouth at bedtime., Disp: , Rfl:    Calcium Carbonate-Vitamin D (CALTRATE 600+D PO), Take 2 tablets by mouth daily., Disp: , Rfl:    clindamycin (CLEOCIN) 150 MG capsule, Take 600mg  1 hour prior to dental procedure, Disp: 10 capsule, Rfl: 0   cyanocobalamin (VITAMIN B12) 1000 MCG/ML injection, INJECT INTO THE MUSCLE EVERY 21 DAYSAS DIRECTED, Disp: 4 mL, Rfl: 3   doxycycline (VIBRA-TABS) 100 MG tablet, Take 1 tablet (100 mg total) by mouth 2 (two) times daily for 10 days., Disp: 20 tablet, Rfl: 0   Eszopiclone 3 MG TABS, TAKE 1 TABLET BY MOUTH AT BEDTIME (TAKE IMMEDIATLEY BEFORE BEDTIME), Disp: 90 tablet, Rfl: 0  ferrous sulfate 325 (65 FE) MG tablet, Take 1 tablet (325 mg total) by mouth 2 (two) times a week., Disp: , Rfl:    fluticasone (FLONASE) 50 MCG/ACT nasal spray, Place 2 sprays into both nostrils daily., Disp: 16 g, Rfl: 6   lamoTRIgine (LAMICTAL) 200 MG tablet, Take 1 tablet (200 mg total) by mouth daily., Disp: 30 tablet, Rfl: 0   lidocaine (LIDODERM) 5 %, Place 3 patches onto the skin daily. Remove & Discard patch within 12 hours or as directed by MD, Disp: 60 patch, Rfl: 0   lidocaine (XYLOCAINE) 2 % solution, Use as directed 15 mLs in the mouth or throat every 4 (four) hours as needed for mouth pain., Disp: 100 mL, Rfl: 0   lidocaine (XYLOCAINE) 5 % ointment,  Apply 1 Application topically as needed. Apply up to 4x/day- on LUE for nerve pain, Disp: 50 g, Rfl: 6   linaclotide (LINZESS) 290 MCG CAPS capsule, Take 1 capsule (290 mcg total) by mouth daily., Disp: 90 capsule, Rfl: 2   morphine (MSIR) 15 MG tablet, TAKE ONE TABLET BY MOUTH EVERY 6 HOURS AS NEEDED FOR SEVERE PAIN, Disp: 90 tablet, Rfl: 0   naloxegol oxalate (MOVANTIK) 25 MG TABS tablet, Take 1 tablet (25 mg total) by mouth daily., Disp: 90 tablet, Rfl: 3   NONFORMULARY OR COMPOUNDED ITEM, Testosterone proprionate petrolatum (jar) 2% ointment  2 Grams S: Apply a small amount once daily x 5 days per week.  [facility]., Disp: 2 each, Rfl: 3   pantoprazole (PROTONIX) 40 MG tablet, TAKE ONE TABLET BY MOUTH TWICE DAILY WITH MEALS, Disp: 180 tablet, Rfl: 3   phenazopyridine (PYRIDIUM) 100 MG tablet, Take 200 mg by mouth 3 (three) times daily as needed., Disp: , Rfl:    polyethylene glycol (MIRALAX / GLYCOLAX) 17 g packet, Take 17 g by mouth 2 (two) times daily., Disp: , Rfl:    Prasterone (INTRAROSA) 6.5 MG INST, UNWRAP AND INSERT 1 SUPPOSITORY  VAGINALLY WITH PROVIDED  APPLICATOR AT BEDTIME, Disp: 90 each, Rfl: 3   pregabalin (LYRICA) 150 MG capsule, TAKE 1 CAPSULE BY MOUTH EVERY MORNING AT NOON AND AT BEDTIME, Disp: 270 capsule, Rfl: 0   tiZANidine (ZANAFLEX) 2 MG tablet, TAKE 1 TABLET BY MOUTH EVERY 8 HOURS AS NEEDED, Disp: 30 tablet, Rfl: 0   ursodiol (ACTIGALL) 300 MG capsule, Take 1 capsule (300 mg total) by mouth 2 (two) times daily., Disp: 180 capsule, Rfl: 3   ondansetron (ZOFRAN) 4 MG tablet, TAKE 1 TABLET BY MOUTH EVERY 8 HOURS AS NEEDED FOR NAUSEA AND VOMITING., Disp: 30 tablet, Rfl: 0  Allergies  Allergen Reactions   Celebrex [Celecoxib] Rash    Headaches   Inh [Isoniazid] Other (See Comments)    Hepatitis   Cymbalta [Duloxetine Hcl] Other (See Comments)    Urinary retention   Nucynta [Tapentadol] Other (See Comments)    Agitation Jerking in legs   Silenor [Doxepin Hcl]  Other (See Comments)    Nightmare Dizziness  Stumbling upon walking Not effective   Benzoin Dermatitis   Duragesic-100 [Fentanyl] Other (See Comments)    Cold intolerance Arms twitching Insomnia  Fatigue    Keflex [Cephalexin] Nausea And Vomiting   Codeine Phosphate Nausea And Vomiting   Demerol [Meperidine Hcl] Rash    Red skin   Lithobid [Lithium] Other (See Comments)    Sores in mouth    Sulfamethoxazole Rash    Objective:   BP 126/82   Pulse 100   Temp 98.8  F (37.1 C) (Temporal)   Ht 5' 2.5" (1.588 m)   Wt 119 lb (54 kg)   LMP 07/25/1998   SpO2 96%   BMI 21.42 kg/m    Physical Exam Constitutional:      General: She is not in acute distress.    Appearance: Normal appearance. She is normal weight. She is not ill-appearing, toxic-appearing or diaphoretic.  HENT:     Head: Normocephalic.     Right Ear: Tympanic membrane normal.     Left Ear: Tympanic membrane normal.     Nose: Rhinorrhea present.     Right Sinus: Maxillary sinus tenderness and frontal sinus tenderness present.     Left Sinus: Maxillary sinus tenderness and frontal sinus tenderness present.     Mouth/Throat:     Mouth: Mucous membranes are dry.     Pharynx: Postnasal drip present. No oropharyngeal exudate or posterior oropharyngeal erythema.  Eyes:     Extraocular Movements: Extraocular movements intact.     Pupils: Pupils are equal, round, and reactive to light.  Cardiovascular:     Rate and Rhythm: Normal rate and regular rhythm.     Pulses: Normal pulses.     Heart sounds: Normal heart sounds.  Pulmonary:     Effort: Pulmonary effort is normal.     Breath sounds: Normal breath sounds.  Musculoskeletal:     Cervical back: Signs of trauma and rigidity present. No edema, erythema or crepitus. Pain with movement, spinous process tenderness and muscular tenderness present. Decreased range of motion.     Thoracic back: Tenderness and bony tenderness present. Decreased range of motion.      Lumbar back: No tenderness or bony tenderness. Decreased range of motion.     Comments: Coccyx tenderness   Neurological:     General: No focal deficit present.     Mental Status: She is alert and oriented to person, place, and time. Mental status is at baseline.  Psychiatric:        Mood and Affect: Mood normal.        Behavior: Behavior normal.        Thought Content: Thought content normal.        Judgment: Judgment normal.     Assessment & Plan:  Head trauma, initial encounter Assessment & Plan: Stat ct head  Discussed ER precautions  Pending results    Orders: -     CT HEAD WO CONTRAST ( ); Future  Injury of neck, initial encounter -     CT CERVICAL SPINE WO CONTRAST; Future  Injury of coccyx, initial encounter -     DG Sacrum/Coccyx; Future  Injury of thoracic spine, initial encounter [facility]) -     DG Thoracic Spine W/Swimmers; Future  Acute non-recurrent frontal sinusitis Assessment & Plan: RX doxycycline 100 mg po bid x 10 days Pt to continue tylenol prn sinus pain. Continue with humidifier prn and steam showers recommended as well. instructed If no symptom improvement in 48 hours please f/u   Orders: -     Doxycycline Hyclate; Take 1 tablet (100 mg total) by mouth 2 (two) times daily for 10 days.  Dispense: 20 tablet; Refill: 0  Nausea -     Ondansetron HCl; TAKE 1 TABLET BY MOUTH EVERY 8 HOURS AS NEEDED FOR NAUSEA AND VOMITING.  Dispense: 30 tablet; Refill: 0   Multiple injuries from fall, ordering coccyx xray, cervical and thoracic spine xray pending results. Tylenol prn. Ice to site prn. Make appt with neurosurgeon as  well.   Will hold off of ibuprofen, toradol and or prednisone until r/o brain bleed on CT  Once CT brain complete, can consider prednisone pack   Follow up plan: Return for f/u PCP if no improvement in symptoms.  Felicita Horns, FNP

## 2023-11-07 ENCOUNTER — Telehealth: Payer: Self-pay

## 2023-11-07 ENCOUNTER — Encounter: Payer: Self-pay | Admitting: Family

## 2023-11-07 ENCOUNTER — Ambulatory Visit (INDEPENDENT_AMBULATORY_CARE_PROVIDER_SITE_OTHER)
Admission: RE | Admit: 2023-11-07 | Discharge: 2023-11-07 | Disposition: A | Discharge status: Home / Self Care | Source: Ambulatory Visit | Attending: Family | Admitting: Family

## 2023-11-07 ENCOUNTER — Other Ambulatory Visit: Payer: Self-pay | Admitting: Family

## 2023-11-07 DIAGNOSIS — M47812 Spondylosis without myelopathy or radiculopathy, cervical region: Secondary | ICD-10-CM

## 2023-11-07 DIAGNOSIS — R918 Other nonspecific abnormal finding of lung field: Secondary | ICD-10-CM

## 2023-11-07 DIAGNOSIS — S299XXA Unspecified injury of thorax, initial encounter: Secondary | ICD-10-CM | POA: Diagnosis not present

## 2023-11-07 MED ORDER — PREDNISONE 10 MG (21) PO TBPK: ORAL_TABLET | ORAL | 0 refills | Status: DC

## 2023-11-07 NOTE — Telephone Encounter (Signed)
 Noted.   Looks like Tabitha sent rx to [facility] earlier this AM.

## 2023-11-07 NOTE — Telephone Encounter (Signed)
 Copied from CRM [id number]. Topic: Clinical - Prescription Issue >> Nov 07, 2023  9:49 AM Artemio Larry wrote: Reason for CRM: [facility] called says patients predniSONE needs to be sent to [facility] on file

## 2023-11-08 ENCOUNTER — Other Ambulatory Visit: Payer: Self-pay | Admitting: Family

## 2023-11-08 ENCOUNTER — Encounter: Payer: Self-pay | Admitting: Family

## 2023-11-08 DIAGNOSIS — J189 Pneumonia, unspecified organism: Secondary | ICD-10-CM

## 2023-11-08 MED ORDER — AMOXICILLIN-POT CLAVULANATE 875-125 MG PO TABS: 1.0000 | ORAL_TABLET | Freq: Two times a day (BID) | ORAL | 0 refills | Status: DC

## 2023-11-14 ENCOUNTER — Other Ambulatory Visit: Payer: Self-pay | Admitting: Family Medicine

## 2023-11-14 DIAGNOSIS — G894 Chronic pain syndrome: Secondary | ICD-10-CM

## 2023-11-15 ENCOUNTER — Encounter: Payer: Self-pay | Admitting: Pulmonary Disease

## 2023-11-15 NOTE — Telephone Encounter (Signed)
 ERx

## 2023-11-15 NOTE — Telephone Encounter (Signed)
 Name of Medication:  MSIR Name of Pharmacy:  [facility] Last Fill or Written Date and Quantity:  09/21/23, #90 Last Office Visit and Type:  10/10/23, CPE Next Office Visit and Type:  11/21/23, PNA & rib fx f/u Last Controlled Substance Agreement Date:  04/19/19 Last UDS:  04/19/19

## 2023-11-19 ENCOUNTER — Encounter: Payer: Self-pay | Admitting: Family Medicine

## 2023-11-19 DIAGNOSIS — G9332 Myalgic encephalomyelitis/chronic fatigue syndrome: Secondary | ICD-10-CM

## 2023-11-20 ENCOUNTER — Telehealth: Payer: Self-pay | Admitting: Family Medicine

## 2023-11-20 DIAGNOSIS — G894 Chronic pain syndrome: Secondary | ICD-10-CM

## 2023-11-20 MED ORDER — MORPHINE SULFATE 15 MG PO TABS: ORAL_TABLET | ORAL | 0 refills | Status: DC

## 2023-11-20 MED ORDER — AMPHETAMINE-DEXTROAMPHET ER 30 MG PO CP24: ORAL_CAPSULE | ORAL | 0 refills | Status: DC

## 2023-11-20 NOTE — Telephone Encounter (Signed)
 Name of Medication:  Adderall  XR Name of Pharmacy:  [facility] [address] Last Fill or Written Date and Quantity: 08/24/23, #90 Last Office Visit and Type:  10/10/23, AWV Next Office Visit and Type:  11/21/23, rib fx and PNA f/u Last Controlled Substance Agreement Date:  04/19/19 Last UDS:  04/19/19

## 2023-11-20 NOTE — Telephone Encounter (Addendum)
 Plz notify I've sent to requested pharmacy.

## 2023-11-20 NOTE — Addendum Note (Signed)
 Addended by: Claire Crick on: 11/20/2023 05:07 PM   Modules accepted: Orders

## 2023-11-20 NOTE — Telephone Encounter (Signed)
 Copied from CRM [id number]. Topic: Clinical - Prescription Issue >> Nov 20, 2023  3:17 PM Star East wrote: Reason for CRM: morphine  (MSIR) 15 MG tablet-  Robin with [facility] has this on back order and can't fill it - [facility] [address] [phone]- will need to [facility]

## 2023-11-21 ENCOUNTER — Ambulatory Visit (INDEPENDENT_AMBULATORY_CARE_PROVIDER_SITE_OTHER): Admitting: Family Medicine

## 2023-11-21 ENCOUNTER — Encounter: Payer: Self-pay | Admitting: Family Medicine

## 2023-11-21 VITALS — BP 130/78 | HR 83 | Temp 98.3°F | Ht 62.5 in | Wt 119.2 lb

## 2023-11-21 DIAGNOSIS — J189 Pneumonia, unspecified organism: Secondary | ICD-10-CM

## 2023-11-21 DIAGNOSIS — I7300 Raynaud's syndrome without gangrene: Secondary | ICD-10-CM | POA: Diagnosis not present

## 2023-11-21 DIAGNOSIS — F331 Major depressive disorder, recurrent, moderate: Secondary | ICD-10-CM

## 2023-11-21 DIAGNOSIS — W19XXXA Unspecified fall, initial encounter: Secondary | ICD-10-CM | POA: Insufficient documentation

## 2023-11-21 DIAGNOSIS — M792 Neuralgia and neuritis, unspecified: Secondary | ICD-10-CM | POA: Diagnosis not present

## 2023-11-21 DIAGNOSIS — M797 Fibromyalgia: Secondary | ICD-10-CM | POA: Diagnosis not present

## 2023-11-21 DIAGNOSIS — W19XXXD Unspecified fall, subsequent encounter: Secondary | ICD-10-CM

## 2023-11-21 HISTORY — DX: Pneumonia, unspecified organism: J18.9

## 2023-11-21 NOTE — Assessment & Plan Note (Addendum)
 RUL and BLL pneumonia on CXR earlier in the month, treated with doxycycline  and augmentin  with improvement.  Lungs sound clear, good O2 sat. Consider rpt CXR at next visit to ensure cleared infection (12/2023)

## 2023-11-21 NOTE — Assessment & Plan Note (Addendum)
 She notes worsening neuropathic pain to L thumb and index finger - described as burning and electrical shock, associated with redness of skin thought stemming from cervical disc disease.  Pain worsens mid afternoon. She will touch base with rheum to discuss possible change in lyrica  dosing.

## 2023-11-21 NOTE — Progress Notes (Addendum)
 Ph: [phone] Fax: [phone]   Patient ID: Rachel Duran, female    DOB: 1955-09-06, 68 y.o.   MRN: [id number]  This visit was conducted in person.  BP 130/78   Pulse 83   Temp 98.3 F (36.8 C) (Oral)   Ht 5' 2.5" (1.588 m)   Wt 119 lb 4 oz (54.1 kg)   LMP 07/25/1998   SpO2 96%   BMI 21.46 kg/m    CC: rib fracture and pneumonia follow up Subjective:   HPI: Rachel Duran is a 68 y.o. female presenting on 11/21/2023 for Follow-up (Here for rib fx and PNA f/u.)   DOI: 11/04/2023 Recent fall onto hardwood floors in middle of night while getting up to go to kitchen for tums - in setting of respiratory infection. She also had overall reassuring imaging done including CT head ,cervical neck, sacrum/coccyx xray, and thoracic spine xrays.  No dizziness prior to fall, no LOC or presyncope.   CT head and cervical spine without contrast IMPRESSION: 1. No evidence of acute intracranial abnormality. 2. No acute cervical spine fracture or traumatic malalignment. 3. Partially visualized confluent pulmonary ground-glass opacity in the right upper lobe, new from 10/23/2023 and nonspecific but may reflect infection, an inflammatory process, or hemorrhage.   CXR showed RUL and BLL consolidations concerning for pneumonia - treated with augmentin  + doxycycline  antibiotic course. She didn't have significant cough or fever. Doxy caused GI upset, no diarrhea. She is improved from this standpoint.   MDD/anxiety - seeing Dr Deborra Falter psychiatrist.  She is on lamictal , xanax , abilify , lyrica  150mg  (only at bedtime).   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      Relevant past medical, surgical, family and social history reviewed and updated as indicated. Interim medical history since our last visit reviewed. Allergies and medications reviewed and updated. Outpatient Medications Prior to Visit  Medication  Sig Dispense Refill   ALPRAZolam  (XANAX ) 1 MG tablet Take 0.5 tablets (0.5 mg total) by mouth at bedtime. 30 tablet 0   amLODipine  (NORVASC ) 2.5 MG tablet TAKE 1 TABLET BY MOUTH DAILY 90 tablet 0   amLODipine  (NORVASC ) 5 MG tablet Take 1 tablet (5 mg total) by mouth daily. 90 tablet 1   amphetamine -dextroamphetamine  (ADDERALL  XR) 30 MG 24 hr capsule TAKE 1 CAPSULE BY MOUTH EVERY MORNING 90 capsule 0   ARIPiprazole  (ABILIFY ) 5 MG tablet Take 5 mg by mouth at bedtime.     Calcium Carbonate-Vitamin D  (CALTRATE 600+D PO) Take 2 tablets by mouth daily.     clindamycin  (CLEOCIN ) 150 MG capsule Take 600mg  1 hour prior to dental procedure 10 capsule 0   cyanocobalamin  (VITAMIN B12) 1000 MCG/ML injection INJECT 1ML INTO THE MUSCLE EVERY 21 DAYSAS DIRECTED 4 mL 3   Eszopiclone  3 MG TABS TAKE 1 TABLET BY MOUTH AT BEDTIME (TAKE IMMEDIATLEY BEFORE BEDTIME) 90 tablet 0   ferrous sulfate  325 (65 FE) MG tablet Take 1 tablet (325 mg total) by mouth 2 (two) times a week.     fluticasone  (FLONASE ) 50 MCG/ACT nasal spray Place 2 sprays into both nostrils daily. 16 g 6   lamoTRIgine  (LAMICTAL ) 200 MG tablet Take 1 tablet (200 mg total) by mouth daily. 30 tablet 0   lidocaine  (LIDODERM ) 5 % Place 3 patches onto the skin daily. Remove & Discard patch within 12 hours or as directed by MD 60 patch 0   lidocaine  (XYLOCAINE )  2 % solution Use as directed 15 mLs in the mouth or throat every 4 (four) hours as needed for mouth pain. 100 mL 0   lidocaine  (XYLOCAINE ) 5 % ointment Apply 1 Application topically as needed. Apply up to 4x/day- on LUE for nerve pain 50 g 6   linaclotide  (LINZESS ) 290 MCG CAPS capsule Take 1 capsule (290 mcg total) by mouth daily. 90 capsule 2   morphine  (MSIR) 15 MG tablet TAKE ONE TABLET BY MOUTH EVERY 6 HOURS AS NEEDED FOR SEVERE PAIN 90 tablet 0   naloxegol  oxalate (MOVANTIK ) 25 MG TABS tablet Take 1 tablet (25 mg total) by mouth daily. 90 tablet 3   NONFORMULARY OR COMPOUNDED ITEM Testosterone   proprionate petrolatum (jar) 2% ointment  2 Grams S: Apply a small amount once daily x 5 days per week.  [facility]. 2 each 3   ondansetron  (ZOFRAN ) 4 MG tablet TAKE 1 TABLET BY MOUTH EVERY 8 HOURS AS NEEDED FOR NAUSEA AND VOMITING. 30 tablet 0   pantoprazole  (PROTONIX ) 40 MG tablet TAKE ONE TABLET BY MOUTH TWICE DAILY WITH MEALS 180 tablet 3   phenazopyridine  (PYRIDIUM ) 100 MG tablet Take 200 mg by mouth 3 (three) times daily as needed.     polyethylene glycol (MIRALAX  / GLYCOLAX ) 17 g packet Take 17 g by mouth 2 (two) times daily.     Prasterone  (INTRAROSA ) 6.5 MG INST UNWRAP AND INSERT 1 SUPPOSITORY  VAGINALLY WITH PROVIDED  APPLICATOR AT BEDTIME 90 each 3   pregabalin  (LYRICA ) 150 MG capsule TAKE 1 CAPSULE BY MOUTH EVERY MORNING AT NOON AND AT BEDTIME 270 capsule 0   ursodiol  (ACTIGALL ) 300 MG capsule Take 1 capsule (300 mg total) by mouth 2 (two) times daily. 180 capsule 3   tiZANidine  (ZANAFLEX ) 2 MG tablet TAKE 1 TABLET BY MOUTH EVERY 8 HOURS AS NEEDED 30 tablet 0   amoxicillin -clavulanate (AUGMENTIN ) 875-125 MG tablet Take 1 tablet by mouth 2 (two) times daily. 20 tablet 0   predniSONE  (STERAPRED UNI-PAK 21 TAB) 10 MG (21) TBPK tablet Take as directed 1 each 0   No facility-administered medications prior to visit.     Per HPI unless specifically indicated in ROS section below Review of Systems  Objective:  BP 130/78   Pulse 83   Temp 98.3 F (36.8 C) (Oral)   Ht 5' 2.5" (1.588 m)   Wt 119 lb 4 oz (54.1 kg)   LMP 07/25/1998   SpO2 96%   BMI 21.46 kg/m   Wt Readings from Last 3 Encounters:  11/21/23 119 lb 4 oz (54.1 kg)  11/06/23 119 lb (54 kg)  10/18/23 122 lb 3.2 oz (55.4 kg)      Physical Exam Vitals and nursing note reviewed.  Constitutional:      Appearance: Normal appearance. She is not ill-appearing.  HENT:     Head: Normocephalic and atraumatic.     Mouth/Throat:     Mouth: Mucous membranes are dry.     Pharynx: Oropharynx is clear. No  oropharyngeal exudate or posterior oropharyngeal erythema.  Eyes:     Extraocular Movements: Extraocular movements intact.     Conjunctiva/sclera: Conjunctivae normal.     Pupils: Pupils are equal, round, and reactive to light.  Neck:     Thyroid : No thyroid  mass or thyromegaly.  Cardiovascular:     Rate and Rhythm: Normal rate and regular rhythm.     Pulses: Normal pulses.     Heart sounds: Normal heart sounds. No murmur heard. Pulmonary:  Effort: Pulmonary effort is normal. No respiratory distress.     Breath sounds: Normal breath sounds. No wheezing, rhonchi or rales.  Chest:     Chest wall: Tenderness (R anterior lower ribcage tenderness) present.  Musculoskeletal:     Cervical back: Normal range of motion and neck supple.     Right lower leg: No edema.     Left lower leg: No edema.  Skin:    General: Skin is warm and dry.     Findings: No rash.  Neurological:     Mental Status: She is alert.  Psychiatric:        Mood and Affect: Mood normal.        Behavior: Behavior normal.       Results for orders placed or performed in visit on 10/23/23  VITAMIN D  25 Hydroxy (Vit-D Deficiency, Fractures)   Collection Time: 10/23/23  8:40 AM  Result Value Ref Range   VITD 50.56 30.00 - 100.00 ng/mL  Vitamin B12   Collection Time: 10/23/23  8:40 AM  Result Value Ref Range   Vitamin B-12 814 211 - 911 pg/mL  CBC with Differential/Platelet   Collection Time: 10/23/23  8:40 AM  Result Value Ref Range   WBC 7.5 4.0 - 10.5 K/uL   RBC 4.46 3.87 - 5.11 Mil/uL   Hemoglobin 12.8 12.0 - 15.0 g/dL   HCT 64.4 03.4 - 74.2 %   MCV 86.1 78.0 - 100.0 fl   MCHC 33.2 30.0 - 36.0 g/dL   RDW 59.5 63.8 - 75.6 %   Platelets 378.0 150.0 - 400.0 K/uL   Neutrophils Relative % 61.7 43.0 - 77.0 %   Lymphocytes Relative 26.5 12.0 - 46.0 %   Monocytes Relative 7.6 3.0 - 12.0 %   Eosinophils Relative 3.4 0.0 - 5.0 %   Basophils Relative 0.8 0.0 - 3.0 %   Neutro Abs 4.6 1.4 - 7.7 K/uL   Lymphs Abs  2.0 0.7 - 4.0 K/uL   Monocytes Absolute 0.6 0.1 - 1.0 K/uL   Eosinophils Absolute 0.3 0.0 - 0.7 K/uL   Basophils Absolute 0.1 0.0 - 0.1 K/uL  TSH   Collection Time: 10/23/23  8:40 AM  Result Value Ref Range   TSH 2.49 0.35 - 5.50 uIU/mL  Comprehensive metabolic panel   Collection Time: 10/23/23  8:40 AM  Result Value Ref Range   Sodium 135 135 - 145 mEq/L   Potassium 5.0 3.5 - 5.1 mEq/L   Chloride 99 96 - 112 mEq/L   CO2 29 19 - 32 mEq/L   Glucose, Bld 104 (H) 70 - 99 mg/dL   BUN 10 6 - 23 mg/dL   Creatinine, Ser 4.33 0.40 - 1.20 mg/dL   Total Bilirubin 0.4 0.2 - 1.2 mg/dL   Alkaline Phosphatase 111 39 - 117 U/L   AST 16 0 - 37 U/L   ALT 8 0 - 35 U/L   Total Protein 6.5 6.0 - 8.3 g/dL   Albumin  4.3 3.5 - 5.2 g/dL   GFR 29.51 >88.41 mL/min   Calcium 9.1 8.4 - 10.5 mg/dL  Lipid panel   Collection Time: 10/23/23  8:40 AM  Result Value Ref Range   Cholesterol 185 0 - 200 mg/dL   Triglycerides 66.0 0.0 - 149.0 mg/dL   HDL 63.01 >60.10 mg/dL   VLDL 93.2 0.0 - 35.5 mg/dL   LDL Cholesterol 96 0 - 99 mg/dL   Total CHOL/HDL Ratio 3    NonHDL 111.50   IBC panel  Collection Time: 10/23/23  8:40 AM  Result Value Ref Range   Iron 169 (H) 42 - 145 ug/dL   Transferrin 295.6 213.0 - 360.0 mg/dL   Saturation Ratios 86.5 20.0 - 50.0 %   TIBC 365.4 250.0 - 450.0 mcg/dL  Ferritin   Collection Time: 10/23/23  8:40 AM  Result Value Ref Range   Ferritin 47.7 10.0 - 291.0 ng/mL  Hemoglobin A1c   Collection Time: 10/23/23  8:40 AM  Result Value Ref Range   Hgb A1c MFr Bld 5.9 4.6 - 6.5 %   *Note: Due to a large number of results and/or encounters for the requested time period, some results have not been displayed. A complete set of results can be found in Results Review.    Assessment & Plan:   Problem List Items Addressed This Visit     MDD (major depressive disorder), recurrent episode, moderate (HCC)   ?remote h/o atypical bipolar. She is not on antidepressant.  She continues  lamictal  and abilify , asks about possible tapering off medications in effort to minimize polypharmacy/meds that could contribute to falls/sedation. She will reach out to psych about this.       [name] syndrome   Continues amlodipine  7.5mg  through rheum. Finger pain is not thought related to raynauds      Fibromyalgia   On lyrica  150mg  through rheumatology.  Written as TID, however she notes 150mg  dose is overly sedating during the day so has only been taking 150mg  1 capsule in evenings. See below. Suggested she reach out to rheum re dosing.       Neuralgia of left upper extremity   She notes worsening neuropathic pain to L thumb and index finger - described as burning and electrical shock, associated with redness of skin thought stemming from cervical disc disease.  Pain worsens mid afternoon. She will touch base with rheum to discuss possible change in lyrica  dosing.      Fall with injury - Primary   Records reviewed. Reassuring imaging. She is recovering adequately after fall. Reviewed pain regimen.       Bilateral pneumonia   RUL and BLL pneumonia on CXR earlier in the month, treated with doxycycline  and augmentin  with improvement.  Lungs sound clear, good O2 sat. Consider rpt CXR at next visit to ensure cleared infection (12/2023)        No orders of the defined types were placed in this encounter.   No orders of the defined types were placed in this encounter.   Patient Instructions  Good to see you today  I'm glad you're doing better from pneumonia and after recent fall.  Discuss lamictal  and abilify  dosing with Dr Deborra Falter  Discuss lyrica  dosing with Dr Jeoffrey Mole  We can consider trial of dropping lunesta  dose to 2mg  for 1 month to see if you sleep just as well.   Follow up plan: Return if symptoms worsen or fail to improve.  Claire Crick, MD

## 2023-11-21 NOTE — Assessment & Plan Note (Signed)
 Continues amlodipine  7.5mg  through rheum. Finger pain is not thought related to raynauds

## 2023-11-21 NOTE — Assessment & Plan Note (Signed)
 Records reviewed. Reassuring imaging. She is recovering adequately after fall. Reviewed pain regimen.

## 2023-11-21 NOTE — Assessment & Plan Note (Addendum)
?  remote h/o atypical bipolar. She is not on antidepressant.  She continues lamictal  and abilify , asks about possible tapering off medications in effort to minimize polypharmacy/meds that could contribute to falls/sedation. She will reach out to psych about this.

## 2023-11-21 NOTE — Telephone Encounter (Signed)
Spoke with pt notifying her rx sent in.  Pt expresses her thanks.

## 2023-11-21 NOTE — Assessment & Plan Note (Signed)
 On lyrica  150mg  through rheumatology.  Written as TID, however she notes 150mg  dose is overly sedating during the day so has only been taking 150mg  1 capsule in evenings. See below. Suggested she reach out to rheum re dosing.

## 2023-11-21 NOTE — Patient Instructions (Addendum)
 Good to see you today  I'm glad you're doing better from pneumonia and after recent fall.  Discuss lamictal  and abilify  dosing with Dr Deborra Falter  Discuss lyrica  dosing with Dr Jeoffrey Mole  We can consider trial of dropping lunesta  dose to 2mg  for 1 month to see if you sleep just as well.

## 2023-11-23 ENCOUNTER — Encounter: Payer: Self-pay | Admitting: Rheumatology

## 2023-11-23 MED ORDER — PREGABALIN 75 MG PO CAPS: ORAL_CAPSULE | ORAL | 2 refills | Status: DC

## 2023-11-23 NOTE — Telephone Encounter (Signed)
 Okay to send Lyrica  prescription for 75 mg 1 in the morning and 2 at bedtime.  Please advise patient Lyrica  may cause drowsiness.  She should not drive while taking Lyrica .  She should not Regarding her previous prescription of Lyrica  150 mg tablets.

## 2023-11-24 ENCOUNTER — Other Ambulatory Visit: Payer: Self-pay | Admitting: *Deleted

## 2023-11-24 MED ORDER — PREGABALIN 75 MG PO CAPS: ORAL_CAPSULE | ORAL | 2 refills | Status: DC

## 2023-11-24 MED ORDER — PREGABALIN 75 MG PO CAPS
ORAL_CAPSULE | ORAL | 2 refills | Status: DC
Start: 2023-11-24 — End: 2024-02-13

## 2023-11-24 NOTE — Addendum Note (Signed)
 Addended by: Adrianne Horn on: 11/24/2023 11:00 AM   Modules accepted: Orders

## 2023-11-28 ENCOUNTER — Ambulatory Visit: Admitting: Family Medicine

## 2023-12-08 NOTE — Telephone Encounter (Signed)
 Reviewed chest x-ray from ED showing right middle and upper lobe pneumonia.  I called the patient to discuss.  She has already received antibiotics and is feeling much better now.  Nothing further needed.

## 2023-12-19 ENCOUNTER — Other Ambulatory Visit: Payer: Self-pay | Admitting: Rheumatology

## 2023-12-19 NOTE — Telephone Encounter (Signed)
 Last Fill: 08/28/2023  Next Visit: 02/26/2024  Last Visit: 08/28/2023  Dx: [name] disease without gangrene   Current Dose per office note on 08/28/2023: manageable with amlodipine  7.5 mg daily   Okay to refill Amlodipine ?

## 2023-12-26 ENCOUNTER — Ambulatory Visit
Admission: RE | Admit: 2023-12-26 | Discharge: 2023-12-26 | Disposition: A | Discharge status: Home / Self Care | Source: Ambulatory Visit | Attending: Obstetrics and Gynecology | Admitting: Obstetrics and Gynecology

## 2023-12-26 ENCOUNTER — Ambulatory Visit
Admission: RE | Admit: 2023-12-26 | Discharge: 2023-12-26 | Disposition: A | Discharge status: Home / Self Care | Payer: Medicare Other | Source: Ambulatory Visit | Attending: Obstetrics and Gynecology | Admitting: Obstetrics and Gynecology

## 2023-12-26 DIAGNOSIS — M8580 Other specified disorders of bone density and structure, unspecified site: Secondary | ICD-10-CM

## 2023-12-26 DIAGNOSIS — Z0000 Encounter for general adult medical examination without abnormal findings: Secondary | ICD-10-CM

## 2023-12-27 ENCOUNTER — Encounter: Payer: Self-pay | Admitting: Obstetrics and Gynecology

## 2023-12-27 ENCOUNTER — Ambulatory Visit: Payer: Self-pay | Admitting: Obstetrics and Gynecology

## 2023-12-30 ENCOUNTER — Ambulatory Visit: Payer: Self-pay | Admitting: Obstetrics and Gynecology

## 2024-01-02 ENCOUNTER — Encounter: Payer: Self-pay | Admitting: Family Medicine

## 2024-01-02 ENCOUNTER — Other Ambulatory Visit: Payer: Self-pay | Admitting: Family Medicine

## 2024-01-02 DIAGNOSIS — G894 Chronic pain syndrome: Secondary | ICD-10-CM

## 2024-01-02 MED ORDER — MORPHINE SULFATE 15 MG PO TABS: ORAL_TABLET | ORAL | 0 refills | Status: DC

## 2024-01-02 NOTE — Telephone Encounter (Signed)
 See 01/02/24 refill note.

## 2024-01-02 NOTE — Telephone Encounter (Unsigned)
 Copied from CRM [id number]. Topic: Clinical - Medication Refill >> Jan 02, 2024  3:56 PM Allyne Areola wrote: Medication: morphine  (MSIR) 15 MG tablet [[id number]]  Has the patient contacted their pharmacy? Yes, patient's local pharmacy is out but [facility] has enough; however, they asked patient to request it be sent to them.  (Agent: If no, request that the patient contact the pharmacy for the refill. If patient does not wish to contact the pharmacy document the reason why and proceed with request.) (Agent: If yes, when and what did the pharmacy advise?)  This is the patient's preferred pharmacy:    [id number] - [place], [place] - [address] AT [facility] [address] [facility] [address] [place] [address] Phone: [phone] Fax: [phone]   Is this the correct pharmacy for this prescription? Yes If no, delete pharmacy and type the correct one.   Has the prescription been filled recently? No  Is the patient out of the medication? Yes  Has the patient been seen for an appointment in the last year OR does the patient have an upcoming appointment? Yes  Can we respond through MyChart? Yes  Agent: Please be advised that Rx refills may take up to 3 business days. We ask that you follow-up with your pharmacy.

## 2024-01-02 NOTE — Telephone Encounter (Signed)
 See 01/02/24 pt msg.   Name of Medication:  MSIR Name of Pharmacy:  [facility] [address] Last Fill or Written Date and Quantity:  11/17/23, #90 Last Office Visit and Type:  11/21/23, PNA & rib fx f/u Next Office Visit and Type:  01/10/24, 3 mo chronic pain f/u Last Controlled Substance Agreement Date:  04/19/19 Last UDS:  04/19/19

## 2024-01-02 NOTE — Addendum Note (Signed)
 Addended by: Claire Crick on: 01/02/2024 05:42 PM   Modules accepted: Orders

## 2024-01-10 ENCOUNTER — Ambulatory Visit (INDEPENDENT_AMBULATORY_CARE_PROVIDER_SITE_OTHER): Admitting: Family Medicine

## 2024-01-10 VITALS — BP 122/76 | HR 97 | Temp 98.3°F | Ht 62.5 in | Wt 120.4 lb

## 2024-01-10 DIAGNOSIS — G4700 Insomnia, unspecified: Secondary | ICD-10-CM | POA: Diagnosis not present

## 2024-01-10 DIAGNOSIS — M810 Age-related osteoporosis without current pathological fracture: Secondary | ICD-10-CM

## 2024-01-10 DIAGNOSIS — G8929 Other chronic pain: Secondary | ICD-10-CM

## 2024-01-10 DIAGNOSIS — M25561 Pain in right knee: Secondary | ICD-10-CM

## 2024-01-10 DIAGNOSIS — F3189 Other bipolar disorder: Secondary | ICD-10-CM

## 2024-01-10 MED ORDER — ESZOPICLONE 3 MG PO TABS: 3.0000 mg | ORAL_TABLET | Freq: Every day | ORAL | 0 refills | Status: DC

## 2024-01-10 NOTE — Patient Instructions (Addendum)
 Good to see you today Continue current medicines.  Return in 3 months for follow up pain visit.  Try neoprene sleeve knee brace until you see ortho.

## 2024-01-10 NOTE — Progress Notes (Unsigned)
 Ph: [phone] Fax: [phone]   Patient ID: Rachel Duran, female    DOB: 1956/04/08, 68 y.o.   MRN: [id number]  This visit was conducted in person.  BP 122/76   Pulse 97   Temp 98.3 F (36.8 C) (Oral)   Ht 5' 2.5 (1.588 m)   Wt 120 lb 6 oz (54.6 kg)   LMP 07/25/1998   SpO2 98%   BMI 21.67 kg/m    CC: 3 mo pain f/u visit  Subjective:   HPI: Rachel Duran is a 68 y.o. female presenting on 01/10/2024 for Medical Management of Chronic Issues (Here for 3 mo pain mgmt f/u.)   Suffered fall onto hardwood floor 10/2023 - worsened pain after this. Did have reassuring eval including head and cervical neck CT.   Chronic pain on MSIR 15mg  Q6 hours PRN (normally BID), lyrica  150mg  BID, lidocaine  patches 1 patch nightly. H/o multiple spine surgeries with residual pain, latest displaced clavicle followed by cervical spine fracture ()C5/6 fracture subluxation s/p repair after fall. H/o prior cervical spine fusion. Notes ongoing neck pain/stiffness - with significant pain. Had to postpone PT start through neuro PT in [place] due to lack of time.  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 Had sedation while on lyrica  150mg  bid - am dose dropped to 75mg  with improved sedation however notes worsening nerve pain to thumb around 3pm.   Sees [facility] for cervical radiculopathy.  Chronic fatigue on adderall  XR 30mg  daily. Tolerates this well.   Recent worsening R knee pressure/pain since fall 10/2023, acutely worse over the past 4 weeks. Notes she's now walking on lateral edge of her shoe since this. Worse pain with going up steps. She is s/p R knee partial replacement 2021. She will see ortho PA 01/23/2024.   Known limited systemic sclerosis, OA, raynaud's and fibromyalgia followed by rheumatology, latest seen 01/2023. 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  Recent high resolution CT chest 09/2023  without ILD. Planned PFTs 03/2024.   Notes ongoing fatigue worse after respiratory infection.   Latest DEXA showing new osteoporosis - planned GYN f/u later this month to discuss treatment options. Sister didn't tolerate oral fosamax well.   Saw psychiatrist - she has fully tapered off abilify  and lamictal .      Relevant past medical, surgical, family and social history reviewed and updated as indicated. Interim medical history since our last visit reviewed. Allergies and medications reviewed and updated. Outpatient Medications Prior to Visit  Medication Sig Dispense Refill   ALPRAZolam  (XANAX ) 1 MG tablet Take 0.5 tablets (0.5 mg total) by mouth at bedtime. 30 tablet 0   amLODipine  (NORVASC ) 2.5 MG tablet TAKE 1 TABLET BY MOUTH DAILY 90 tablet 0   amLODipine  (NORVASC ) 5 MG tablet TAKE 1 TABLET BY MOUTH DAILY 90 tablet 0   amphetamine -dextroamphetamine  (ADDERALL  XR) 30 MG 24 hr capsule TAKE 1 CAPSULE BY MOUTH EVERY MORNING 90 capsule 0   Calcium Carbonate-Vitamin D  (CALTRATE 600+D PO) Take 2 tablets by mouth daily.     clindamycin  (CLEOCIN ) 150 MG capsule Take 600mg  1 hour prior to dental procedure 10 capsule 0   cyanocobalamin  (VITAMIN B12) 1000 MCG/ML injection INJECT 1ML INTO THE MUSCLE EVERY 21 DAYSAS DIRECTED 4 mL 3   Eszopiclone  3 MG TABS TAKE 1 TABLET BY MOUTH AT BEDTIME (TAKE IMMEDIATLEY BEFORE BEDTIME) 90 tablet 0   ferrous sulfate  325 (65 FE) MG tablet  Take 1 tablet (325 mg total) by mouth 2 (two) times a week.     fluticasone  (FLONASE ) 50 MCG/ACT nasal spray Place 2 sprays into both nostrils daily. 16 g 6   lidocaine  (LIDODERM ) 5 % Place 3 patches onto the skin daily. Remove & Discard patch within 12 hours or as directed by MD 60 patch 0   lidocaine  (XYLOCAINE ) 2 % solution Use as directed 15 mLs in the mouth or throat every 4 (four) hours as needed for mouth pain. 100 mL 0   lidocaine  (XYLOCAINE ) 5 % ointment Apply 1 Application topically as needed. Apply up to 4x/day- on LUE for  nerve pain 50 g 6   linaclotide  (LINZESS ) 290 MCG CAPS capsule Take 1 capsule (290 mcg total) by mouth daily. 90 capsule 2   morphine  (MSIR) 15 MG tablet TAKE ONE TABLET BY MOUTH EVERY 6 HOURS AS NEEDED FOR SEVERE PAIN 90 tablet 0   naloxegol  oxalate (MOVANTIK ) 25 MG TABS tablet Take 1 tablet (25 mg total) by mouth daily. 90 tablet 3   NONFORMULARY OR COMPOUNDED ITEM Testosterone  proprionate petrolatum (jar) 2% ointment  2 Grams S: Apply a small amount once daily x 5 days per week.  [facility]. 2 each 3   ondansetron  (ZOFRAN ) 4 MG tablet TAKE 1 TABLET BY MOUTH EVERY 8 HOURS AS NEEDED FOR NAUSEA AND VOMITING. 30 tablet 0   pantoprazole  (PROTONIX ) 40 MG tablet TAKE ONE TABLET BY MOUTH TWICE DAILY WITH MEALS 180 tablet 3   phenazopyridine  (PYRIDIUM ) 100 MG tablet Take 200 mg by mouth 3 (three) times daily as needed.     polyethylene glycol (MIRALAX  / GLYCOLAX ) 17 g packet Take 17 g by mouth 2 (two) times daily.     Prasterone  (INTRAROSA ) 6.5 MG INST UNWRAP AND INSERT 1 SUPPOSITORY  VAGINALLY WITH PROVIDED  APPLICATOR AT BEDTIME 90 each 3   pregabalin  (LYRICA ) 75 MG capsule Take 1 capsule in the morning and 2 capsules at bedtime. 90 capsule 2   ursodiol  (ACTIGALL ) 300 MG capsule Take 1 capsule (300 mg total) by mouth 2 (two) times daily. 180 capsule 3   ARIPiprazole  (ABILIFY ) 5 MG tablet Take 5 mg by mouth at bedtime.     lamoTRIgine  (LAMICTAL ) 200 MG tablet Take 1 tablet (200 mg total) by mouth daily. 30 tablet 0   No facility-administered medications prior to visit.     Per HPI unless specifically indicated in ROS section below Review of Systems  Objective:  BP 122/76   Pulse 97   Temp 98.3 F (36.8 C) (Oral)   Ht 5' 2.5 (1.588 m)   Wt 120 lb 6 oz (54.6 kg)   LMP 07/25/1998   SpO2 98%   BMI 21.67 kg/m   Wt Readings from Last 3 Encounters:  01/10/24 120 lb 6 oz (54.6 kg)  11/21/23 119 lb 4 oz (54.1 kg)  11/06/23 119 lb (54 kg)      Physical Exam Vitals and nursing note  reviewed.  Constitutional:      Appearance: Normal appearance. She is not ill-appearing.   Cardiovascular:     Rate and Rhythm: Normal rate and regular rhythm.     Pulses: Normal pulses.     Heart sounds: Normal heart sounds. No murmur heard. Pulmonary:     Effort: Pulmonary effort is normal. No respiratory distress.     Breath sounds: Normal breath sounds. No wheezing, rhonchi or rales.   Musculoskeletal:        General: Tenderness present.  No swelling.     Right lower leg: No edema.     Left lower leg: No edema.     Comments:  FROM bilateral knees flexion/extension without significant crepitus Tender to palpation along anterior R knee, no pain to medial joint lines  Mild discomfort to R popliteal region without significant swelling No erythema or significant swelling  Longitudinal scar to R knee    Skin:    General: Skin is warm and dry.     Findings: No rash.   Neurological:     Mental Status: She is alert.   Psychiatric:        Mood and Affect: Mood normal.        Behavior: Behavior normal.       Results for orders placed or performed in visit on 10/23/23  VITAMIN D  25 Hydroxy (Vit-D Deficiency, Fractures)   Collection Time: 10/23/23  8:40 AM  Result Value Ref Range   VITD 50.56 30.00 - 100.00 ng/mL  Vitamin B12   Collection Time: 10/23/23  8:40 AM  Result Value Ref Range   Vitamin B-12 814 211 - 911 pg/mL  CBC with Differential/Platelet   Collection Time: 10/23/23  8:40 AM  Result Value Ref Range   WBC 7.5 4.0 - 10.5 K/uL   RBC 4.46 3.87 - 5.11 Mil/uL   Hemoglobin 12.8 12.0 - 15.0 g/dL   HCT 40.9 81.1 - 91.4 %   MCV 86.1 78.0 - 100.0 fl   MCHC 33.2 30.0 - 36.0 g/dL   RDW 78.2 95.6 - 21.3 %   Platelets 378.0 150.0 - 400.0 K/uL   Neutrophils Relative % 61.7 43.0 - 77.0 %   Lymphocytes Relative 26.5 12.0 - 46.0 %   Monocytes Relative 7.6 3.0 - 12.0 %   Eosinophils Relative 3.4 0.0 - 5.0 %   Basophils Relative 0.8 0.0 - 3.0 %   Neutro Abs 4.6 1.4 - 7.7  K/uL   Lymphs Abs 2.0 0.7 - 4.0 K/uL   Monocytes Absolute 0.6 0.1 - 1.0 K/uL   Eosinophils Absolute 0.3 0.0 - 0.7 K/uL   Basophils Absolute 0.1 0.0 - 0.1 K/uL  TSH   Collection Time: 10/23/23  8:40 AM  Result Value Ref Range   TSH 2.49 0.35 - 5.50 uIU/mL  Comprehensive metabolic panel   Collection Time: 10/23/23  8:40 AM  Result Value Ref Range   Sodium 135 135 - 145 mEq/L   Potassium 5.0 3.5 - 5.1 mEq/L   Chloride 99 96 - 112 mEq/L   CO2 29 19 - 32 mEq/L   Glucose, Bld 104 (H) 70 - 99 mg/dL   BUN 10 6 - 23 mg/dL   Creatinine, Ser 0.86 0.40 - 1.20 mg/dL   Total Bilirubin 0.4 0.2 - 1.2 mg/dL   Alkaline Phosphatase 111 39 - 117 U/L   AST 16 0 - 37 U/L   ALT 8 0 - 35 U/L   Total Protein 6.5 6.0 - 8.3 g/dL   Albumin  4.3 3.5 - 5.2 g/dL   GFR 57.84 >69.62 mL/min   Calcium 9.1 8.4 - 10.5 mg/dL  Lipid panel   Collection Time: 10/23/23  8:40 AM  Result Value Ref Range   Cholesterol 185 0 - 200 mg/dL   Triglycerides 95.2 0.0 - 149.0 mg/dL   HDL 84.13 >24.40 mg/dL   VLDL 10.2 0.0 - 72.5 mg/dL   LDL Cholesterol 96 0 - 99 mg/dL   Total CHOL/HDL Ratio 3    NonHDL 111.50   IBC  panel   Collection Time: 10/23/23  8:40 AM  Result Value Ref Range   Iron 169 (H) 42 - 145 ug/dL   Transferrin 161.0 960.4 - 360.0 mg/dL   Saturation Ratios 54.0 20.0 - 50.0 %   TIBC 365.4 250.0 - 450.0 mcg/dL  Ferritin   Collection Time: 10/23/23  8:40 AM  Result Value Ref Range   Ferritin 47.7 10.0 - 291.0 ng/mL  Hemoglobin A1c   Collection Time: 10/23/23  8:40 AM  Result Value Ref Range   Hgb A1c MFr Bld 5.9 4.6 - 6.5 %   *Note: Due to a large number of results and/or encounters for the requested time period, some results have not been displayed. A complete set of results can be found in Results Review.    Assessment & Plan:   Problem List Items Addressed This Visit   None    No orders of the defined types were placed in this encounter.   No orders of the defined types were placed in this  encounter.   There are no Patient Instructions on file for this visit.  Follow up plan: No follow-ups on file.  Claire Crick, MD

## 2024-01-12 NOTE — Assessment & Plan Note (Signed)
 Saw psychiatrist, fully tapered off abilify  and lamictal  in hopes to improve daytime sleepiness/fatigue. Doing well after taper.

## 2024-01-12 NOTE — Assessment & Plan Note (Addendum)
 Acute on chronic worsening of R knee pain without known injury  This is knee she underwent partial replacement 2021.  She has ortho f/u scheduled in 2 wks.  Suggested knee brace use until then.  No signs of infection or fracture.

## 2024-01-12 NOTE — Assessment & Plan Note (Signed)
 Chronic, stable period. Refill lunesta  which is beneficial and tolerated.

## 2024-01-12 NOTE — Assessment & Plan Note (Addendum)
 Reviewed latest DEXA showing progression to OP, ordered by GYN.  Discussed this as well as treatment options including IV Reclast vs IM Prolia. She has GYN f/u scheduled later in the month to discuss this.

## 2024-01-12 NOTE — Assessment & Plan Note (Signed)
[place] CSRS reviewed  ?

## 2024-01-22 ENCOUNTER — Ambulatory Visit (INDEPENDENT_AMBULATORY_CARE_PROVIDER_SITE_OTHER): Admitting: Obstetrics and Gynecology

## 2024-01-22 ENCOUNTER — Encounter: Payer: Self-pay | Admitting: Obstetrics and Gynecology

## 2024-01-22 VITALS — BP 122/82 | HR 97

## 2024-01-22 DIAGNOSIS — M810 Age-related osteoporosis without current pathological fracture: Secondary | ICD-10-CM | POA: Diagnosis not present

## 2024-01-22 DIAGNOSIS — R6882 Decreased libido: Secondary | ICD-10-CM

## 2024-01-22 NOTE — Progress Notes (Unsigned)
 GYNECOLOGY  VISIT   HPI: 68 y.o.   Married  Caucasian female   G2P2002 with Patient's last menstrual period was 07/25/1998.   here for: Discuss dexa results.  Study Result  Narrative & Impression  EXAM: DUAL X-RAY ABSORPTIOMETRY (DXA) FOR BONE MINERAL DENSITY 12/26/2023 11:03 am   CLINICAL DATA:  68 year old Female Postmenopausal. Osteopenia, menopause; Screening for osteoporosis   History of fragility fracture. History of vertebral fracture. Patient is or has been on bone building therapies.   TECHNIQUE: An axial (e.g., hips, spine) and/or appendicular (e.g., radius) exam was performed, as appropriate, using GE Secretary/administrator at [employer] [facility]. Images are obtained for bone mineral density measurement and are not obtained for diagnostic purposes. [id number]   Exclusions: Lumbar spine due to degenerative changes; left hip due to surgical hardware.   COMPARISON:  10/06/2021.   FINDINGS: Scan quality: Good.   RIGHT FEMORAL NECK:   BMD (in g/cm2): 0.694   T-score: -2.5   Z-score: -1.0   RIGHT TOTAL HIP:   BMD (in g/cm2): 0.678   T-score: -2.6   Z-score: -1.4   Rate of change from previous exam: No significant rate of change from previous exam.   LEFT FOREARM (RADIUS 33%):   BMD (in g/cm2): 0.646   T-score: -2.6   Z-score: -1.2   Rate of change from previous exam: -12.0 %   FRAX 10-YEAR PROBABILITY OF FRACTURE:   FRAX not reported as the lowest BMD is not in the osteopenia range.   IMPRESSION: Osteoporosis based on BMD.   Fracture risk is unknown due to history of bone building therapy.   RECOMMENDATIONS: 1. All patients should optimize calcium and vitamin D  intake.   2. Consider FDA-approved medical therapies in postmenopausal women and men aged 54 years and older, based on the following:   - A hip or vertebral (clinical or morphometric) fracture   - T-score less than or equal to -2.5 and secondary  causes have been excluded.   - Low bone mass (T-score between -1.0 and -2.5) and a 10-year probability of a hip fracture greater than or equal to 3% or a 10-year probability of a major osteoporosis-related fracture greater than or equal to 20% based on the [place] -adapted WHO algorithm.   - Clinician judgment and/or patient preferences may indicate treatment for people with 10-year fracture probabilities above or below these levels   3. Patients with diagnosis of osteoporosis or at high risk for fracture should have regular bone mineral density tests. For patients eligible for Medicare, routine testing is allowed once every 2 years. The testing frequency can be increased to one year for patients who have rapidly progressing disease, those who are receiving or discontinuing medical therapy to restore bone mass, or have additional risk factors.     Electronically Signed   By: Reyes Phi M.D.   On: 12/26/2023 15:58     Neck fracture in 2023 due to a fall.  Left clavicle fracture in 2023 due to a fall. Rib fracture in April, 2025.   Finger fractures as a child.   No prior bone building medication.     Had a right knee replacement, and she is having difficulty with walking.    Had a left hip replacement.   Had dyspepsia and takes medication for many years.   Has scleroderma.  Sees cardiology and pulmonary yearly.  No prior cardiac event.   Takes vit D daily.  Takes calcium 600 mg  twice daily.   Stopped Abilify  and Lamictal  3 weeks ago through Dr. Vincente.    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  Having eye injections.   Caring for her grandchildren.   GYNECOLOGIC HISTORY: Patient's last menstrual period was 07/25/1998. Contraception:  PMP Menopausal hormone therapy:  testosterone   Last 2 paps:  04/29/21 neg HR HPV neg, 01/31/19 neg  History of abnormal Pap or positive HPV:  no Mammogram:  12/26/23 Breast Density Cat C, BIRADS Cat 1 neg         OB History      Gravida  2   Para  2   Term  2   Preterm      AB      Living  2      SAB      IAB      Ectopic      Multiple      Live Births                 Patient Active Problem List   Diagnosis Date Noted   Fall with injury 11/21/2023   Bilateral pneumonia 11/21/2023   Head trauma, initial encounter 11/06/2023   Chronic cough 06/20/2023   Onycholysis 04/10/2023   Grieving 06/27/2022   Elevated blood pressure reading in office without diagnosis of hypertension 05/31/2022   Closed nondisplaced fracture of lateral end of left clavicle 05/30/2022   Fusion of spine, cervical region    Cervical spine arthritis with nerve pain 12/29/2021   C6 cervical fracture (HCC) 12/29/2021   C5 cervical fracture (HCC) 12/21/2021   Advanced directives, counseling/discussion 09/23/2021   Swelling of right index finger 07/14/2021   Synovitis of right knee    Scoliosis 01/13/2021   Osteoporosis 07/02/2020   Status post left knee surgery 06/01/2020   Pulmonary nodule 05/27/2020   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   Right knee pain 05/05/2020   Positive ANA (antinuclear antibody) 06/05/2019   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   Unilateral primary osteoarthritis, left hip 01/28/2019   Common bile duct (CBD) obstruction    Venous insufficiency of left lower extremity 07/04/2018   Dyslipidemia 09/22/2017   DDD (degenerative disc disease), cervical 04/03/2017   DDD (degenerative disc disease), lumbar 04/03/2017   Onychomycosis 03/21/2017   Dysphagia 10/18/2016   Encounter for chronic pain management 10/18/2016   Biliary stasis 09/26/2016   Dry mouth 06/06/2016   HNP (herniated nucleus pulposus), lumbar 09/23/2015   Iron deficiency 07/30/2015   Health maintenance examination 06/15/2015   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   Clavicle enlargement 12/13/2014   Prediabetes 12/13/2014   Plantar fasciitis, bilateral 09/11/2014   Medicare annual wellness visit,  subsequent 06/10/2014   Abnormal thyroid  function test 06/10/2014   Chronic pain syndrome 06/10/2014   CFS (chronic fatigue syndrome) 04/08/2014   Postmenopausal atrophic vaginitis 10/19/2012   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   High-tone pelvic floor dysfunction 06/04/2011   Irritable bowel syndrome 06/04/2011   Cervical disc disorder with radiculopathy of cervical region 05/28/2010   APHTHOUS ULCERS 01/31/2008   Insomnia 11/09/2007   Constipation 10/11/2007   Allergic rhinitis 04/12/2007   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 C6- C7 -hx. past fusion(bone graft used)   Cholecystitis    Chronic pain    DDD (degenerative disc disease), lumbar 09/2015   dextroscoliosis with multilevel DDD and facet arthrosis most notable for R foraminal disc protrusion L4/5 producing severe R neural foraminal stenosis abutting R L4 nerve root, moderate spinal canal and mild lat recesss and R neural foraminal stenosis L3/4 (MRI)   Depression    bipolar depression   Disorders of porphyrin metabolism    Felon of finger of left hand 11/10/2016   Fibromyalgia    GERD (gastroesophageal reflux disease)    Headache    occasionally   Internal hemorrhoids    Interstitial cystitis 06/06/2012   hx.   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 (Dr Jarold retinologist)   Tuberculosis     Past Surgical History:  Procedure Laterality Date   ANTERIOR CERVICAL DECOMP/DISCECTOMY FUSION  2004   C5/6, C6/7   ANTERIOR CERVICAL DECOMP/DISCECTOMY FUSION  02/2016   C3/4, C4/5 with plating Gavin)   ANTERIOR CERVICAL DECOMP/DISCECTOMY FUSION  12/29/2021   Procedure: ANTERIOR CERVICAL DECOMPRESSION/ DISCECTOMY FUSION CERVICAL THREE - SEVEN;  Surgeon: Mavis Purchase, MD;  Location: [facility] OR;  Service: Neurosurgery;;   ANTERIOR CERVICAL DECOMPRESSION/DISCECTOMY FUSION 4 LEVELS N/A 12/29/2021   Procedure: CERVICAL FIVE-SIX CORPECTOMY,;  Surgeon: Mavis Purchase, MD;  Location: [facility] OR;  Service: Neurosurgery;  Laterality: N/A;   AUGMENTATION MAMMAPLASTY Bilateral 03/25/2010   BREAST ENHANCEMENT SURGERY  2010   BREAST IMPLANT EXCHANGE  10/2014   exchange saline implants, B mastopexy/capsulorraphy ([facility])   BUNIONECTOMY Bilateral yrs ago   CESAREAN SECTION  1985   x 1   CHOLECYSTECTOMY  06/11/2012   Procedure: LAPAROSCOPIC CHOLECYSTECTOMY WITH INTRAOPERATIVE CHOLANGIOGRAM;  Surgeon: Camellia CHRISTELLA Blush, MD,FACS;  Location: [facility] ORS;  Service: General;  Laterality: N/A;   COLONOSCOPY  02/2018   done for positive cologuard - WNL, rpt 10 yrs Oma)   CYSTOSCOPY     ERCP  05/22/2012   Procedure: ENDOSCOPIC RETROGRADE CHOLANGIOPANCREATOGRAPHY (ERCP);  Surgeon: Gwendlyn ONEIDA Buddy, MD,FACG;  Location: [facility];  Service: Endoscopy;  Laterality: N/A;   ERCP N/A 09/17/2013   Procedure: ENDOSCOPIC RETROGRADE CHOLANGIOPANCREATOGRAPHY (ERCP);  Surgeon: Gwendlyn ONEIDA Buddy, MD;  Location: [facility];  Service: Endoscopy;  Laterality: N/A;   ERCP N/A 09/27/2018    Procedure: ENDOSCOPIC RETROGRADE CHOLANGIOPANCREATOGRAPHY (ERCP);  Surgeon: Buddy Gwendlyn ONEIDA, MD;  Location: [facility];  Service: Endoscopy;  Laterality: N/A;   ESOPHAGOGASTRODUODENOSCOPY  09/2016   WNL. esophagus dilated Oma)   HEMORRHOID BANDING  09/23/2013   --Dr. Camellia Blush   HERNIA REPAIR     inguinal   HYSTEROSCOPY W/ ENDOMETRIAL ABLATION     KNEE ARTHROSCOPY  Right 04/06/2021   Procedure: RIGHT KNEE ARTHROSCOPY WITH DEBRIDEMENT;  Surgeon: Addie Cordella Hamilton, MD;  Location: [facility];  Service: Orthopedics;  Laterality: Right;   NASAL SINUS SURGERY     x5   PARTIAL KNEE ARTHROPLASTY Right 06/01/2020   Procedure: Right knee patellofemoral replacement;  Surgeon: Addie Cordella Hamilton, MD;  Location: [facility];  Service: Orthopedics;  Laterality: Right;   POSTERIOR CERVICAL FUSION/FORAMINOTOMY N/A 12/29/2021   Procedure: POSTERIOR CERVICAL FUSION CERVICAL THREE-SEVEN;  Surgeon: Mavis Purchase, MD;  Location: [facility] OR;  Service: Neurosurgery;  Laterality: N/A;   REMOVAL OF STONES  09/27/2018   Procedure: REMOVAL OF STONES;  Surgeon: Aneita Gwendlyn DASEN, MD;  Location: [facility];  Service: Endoscopy;;   SPHINCTEROTOMY  09/27/2018   Procedure: SPHINCTEROTOMY;  Surgeon: Aneita Gwendlyn DASEN, MD;  Location: [facility];  Service: Endoscopy;;   TONSILLECTOMY     removed as a child   TOTAL HIP ARTHROPLASTY Left 03/12/2019   Procedure: LEFT TOTAL HIP ARTHROPLASTY ANTERIOR APPROACH;  Jan, Lonni GRADE, MD)   UPPER GASTROINTESTINAL ENDOSCOPY      Current Outpatient Medications  Medication Sig Dispense Refill   ALPRAZolam  (XANAX ) 1 MG tablet Take 0.5 tablets (0.5 mg total) by mouth at bedtime. 30 tablet 0   amLODipine  (NORVASC ) 2.5 MG tablet TAKE 1 TABLET BY MOUTH DAILY 90 tablet 0   amLODipine  (NORVASC ) 5 MG tablet TAKE 1 TABLET BY MOUTH DAILY 90 tablet 0   amphetamine -dextroamphetamine  (ADDERALL  XR) 30 MG 24 hr capsule TAKE 1 CAPSULE BY MOUTH EVERY  MORNING 90 capsule 0   Calcium Carbonate-Vitamin D  (CALTRATE 600+D PO) Take 2 tablets by mouth daily.     clindamycin  (CLEOCIN ) 150 MG capsule Take 600mg  1 hour prior to dental procedure 10 capsule 0   cyanocobalamin  (VITAMIN B12) 1000 MCG/ML injection INJECT 1ML INTO THE MUSCLE EVERY 21 DAYSAS DIRECTED 4 mL 3   Eszopiclone  3 MG TABS Take 1 tablet (3 mg total) by mouth at bedtime. Take immediately before bedtime 90 tablet 0   ferrous sulfate  325 (65 FE) MG tablet Take 1 tablet (325 mg total) by mouth 2 (two) times a week.     fluticasone  (FLONASE ) 50 MCG/ACT nasal spray Place 2 sprays into both nostrils daily. 16 g 6   lidocaine  (LIDODERM ) 5 % Place 3 patches onto the skin daily. Remove & Discard patch within 12 hours or as directed by MD 60 patch 0   lidocaine  (XYLOCAINE ) 5 % ointment Apply 1 Application topically as needed. Apply up to 4x/day- on LUE for nerve pain 50 g 6   linaclotide  (LINZESS ) 290 MCG CAPS capsule Take 1 capsule (290 mcg total) by mouth daily. 90 capsule 2   morphine  (MSIR) 15 MG tablet TAKE ONE TABLET BY MOUTH EVERY 6 HOURS AS NEEDED FOR SEVERE PAIN 90 tablet 0   naloxegol  oxalate (MOVANTIK ) 25 MG TABS tablet Take 1 tablet (25 mg total) by mouth daily. 90 tablet 3   NONFORMULARY OR COMPOUNDED ITEM Testosterone  proprionate petrolatum (jar) 2% ointment  2 Grams S: Apply a small amount once daily x 5 days per week.  [facility]. 2 each 3   ondansetron  (ZOFRAN ) 4 MG tablet TAKE 1 TABLET BY MOUTH EVERY 8 HOURS AS NEEDED FOR NAUSEA AND VOMITING. 30 tablet 0   pantoprazole  (PROTONIX ) 40 MG tablet TAKE ONE TABLET BY MOUTH TWICE DAILY WITH MEALS 180 tablet 3   phenazopyridine  (PYRIDIUM ) 100 MG tablet Take 200 mg by mouth 3 (three) times daily  as needed.     polyethylene glycol (MIRALAX  / GLYCOLAX ) 17 g packet Take 17 g by mouth 2 (two) times daily.     Prasterone  (INTRAROSA ) 6.5 MG INST UNWRAP AND INSERT 1 SUPPOSITORY  VAGINALLY WITH PROVIDED  APPLICATOR AT BEDTIME 90 each 3    pregabalin  (LYRICA ) 75 MG capsule Take 1 capsule in the morning and 2 capsules at bedtime. 90 capsule 2   tobramycin (TOBREX) 0.3 % ophthalmic solution SMARTSIG:In Eye(s)     ursodiol  (ACTIGALL ) 300 MG capsule Take 1 capsule (300 mg total) by mouth 2 (two) times daily. 180 capsule 3   No current facility-administered medications for this visit.     ALLERGIES: Celebrex  [celecoxib ], Inh [isoniazid], Cymbalta  [duloxetine  hcl], Nucynta  [tapentadol ], Silenor  [doxepin  hcl], Benzoin, Duragesic -100 [fentanyl ], Keflex  [cephalexin ], Codeine phosphate, Demerol [meperidine hcl], Lithobid [lithium], and Sulfamethoxazole  Family History  Problem Relation Age of Onset   CAD Father 50       MI, nonsmoker   Esophageal cancer Father 76   Stomach cancer Father    Scleroderma Mother    Hypertension Mother    Esophageal cancer Paternal Grandfather    Stomach cancer Paternal Grandfather    Diabetes Maternal Grandmother    Arthritis Brother    Stroke Neg Hx    Colon cancer Neg Hx    Rectal cancer Neg Hx     Social History   Socioeconomic History   Marital status: Married    Spouse name: Not on file   Number of children: Not on file   Years of education: Not on file   Highest education level: 12th grade  Occupational History   Occupation: retired    Associate Professor: UNEMPLOYED  Tobacco Use   Smoking status: Never    Passive exposure: Never   Smokeless tobacco: Never  Vaping Use   Vaping status: Never Used  Substance and Sexual Activity   Alcohol use: No    Alcohol/week: 0.0 standard drinks of alcohol   Drug use: No   Sexual activity: Yes    Partners: Male    Birth control/protection: Post-menopausal    Comment: vasectomy, 1st intercourse- 40 y.o., partner- 1, DES exposure unknown  Other Topics Concern   Not on file  Social History Narrative   Lives with husband and dog   Caring for 3 grandchildren after daughter Lenis) was killed 05/2022.   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   Activity: tries to walk dog (45 min 3x/wk), yoga   Diet: good water , fruits/vegetables daily      Rheum: [name]   Psychiatrist: Vincente   Surgery: Tanda   GIBETHA Buddy   Urology: Janit   Social Drivers of Health   Financial Resource Strain: Low Risk  (01/10/2024)   Overall Financial Resource Strain (CARDIA)    Difficulty of Paying Living Expenses: Not hard at all  Food Insecurity: No Food Insecurity (01/10/2024)   Hunger Vital Sign    Worried About Running Out of Food in the Last Year: Never true    Ran Out of Food in the Last Year: Never true  Transportation Needs: No Transportation Needs (01/10/2024)   PRAPARE - Administrator, Civil Service (Medical): No    Lack of Transportation (Non-Medical): No  Physical Activity: Insufficiently Active (01/10/2024)   Exercise Vital Sign    Days of Exercise per Week: 3 days    Minutes of Exercise per Session: 40 min  Stress: No Stress Concern Present (01/10/2024)  [employer] of Occupational Health - Occupational Stress Questionnaire    Feeling of Stress: Not at all  Social Connections: Socially Integrated (01/10/2024)   Social Connection and Isolation Panel    Frequency of Communication with Friends and Family: More than three times a week    Frequency of Social Gatherings with Friends and Family: Once a week    Attends Religious Services: More than 4 times per year    Active Member of [employer] or Organizations: Yes    Attends Engineer, structural: More than 4 times per year    Marital Status: Married  Catering manager Violence: Not At Risk (09/22/2022)   Humiliation, Afraid, Rape, and Kick questionnaire    Fear of Current or Ex-Partner: No    Emotionally Abused: No    Physically Abused: No    Sexually Abused: No    Review of Systems  All other systems reviewed and are negative.   PHYSICAL EXAMINATION:   BP 122/82 (BP Location: Left Arm, Patient Position: Sitting)   Pulse 97    LMP 07/25/1998   SpO2 99%     General appearance: alert, cooperative and appears stated age  ASSESSMENT:  Right hip and left forearm osteoporosis. Hx multiple fractures.  Stargard's.  At increased risk for decreased vision. At increased risk of fall and fracture.   PLAN  Osteoporosis and increased risk of fracture reviewed.   Will check PTH, calcium, phosphorus.   See Epic for other labs.  We reviewed treatment options, and we focused on Evenity and Prolia.  Evenity followed by Elita will be our current goal.  Risks and benefits reviewed.  Fall risk reduction reviewed.   29 mi  total time was spent for this patient encounter, including preparation, face-to-face counseling with the patient, coordination of care, and documentation of the encounter.

## 2024-01-22 NOTE — Patient Instructions (Addendum)
 Denosumab Injection (Osteoporosis) What is this medication? DENOSUMAB (den oh SUE mab) prevents and treats osteoporosis. It works by Interior and spatial designer stronger and less likely to break (fracture). It is a monoclonal antibody. This medicine may be used for other purposes; ask your health care provider or pharmacist if you have questions. COMMON BRAND NAME(S): Prolia What should I tell my care team before I take this medication? They need to know if you have any of these conditions: Dental or gum disease Had thyroid  or parathyroid (glands located in neck) surgery Having dental surgery or a tooth pulled Kidney disease Low levels of calcium  in the blood On dialysis Poor nutrition Thyroid  disease Trouble absorbing nutrients from your food An unusual or allergic reaction to denosumab, other medications, foods, dyes, or preservatives Pregnant or trying to get pregnant Breastfeeding How should I use this medication? This medication is injected under the skin. It is given by your care team in a hospital or clinic setting. A special MedGuide will be given to you before each treatment. Be sure to read this information carefully each time. Talk to your care team about the use of this medication in children. Special care may be needed. Overdosage: If you think you have taken too much of this medicine contact a poison control center or emergency room at once. NOTE: This medicine is only for you. Do not share this medicine with others. What if I miss a dose? Keep appointments for follow-up doses. It is important not to miss your dose. Call your care team if you are unable to keep an appointment. What may interact with this medication? Do not take this medication with any of the following: Other medications that contain denosumab This medication may also interact with the following: Medications that lower your chance of fighting infection Steroid medications, such as prednisone or cortisone This  list may not describe all possible interactions. Give your health care provider a list of all the medicines, herbs, non-prescription drugs, or dietary supplements you use. Also tell them if you smoke, drink alcohol, or use illegal drugs. Some items may interact with your medicine. What should I watch for while using this medication? Your condition will be monitored carefully while you are receiving this medication. You may need blood work done while taking this medication. This medication may increase your risk of getting an infection. Call your care team for advice if you get a fever, chills, sore throat, or other symptoms of a cold or flu. Do not treat yourself. Try to avoid being around people who are sick. Tell your dentist and dental surgeon that you are taking this medication. You should not have major dental surgery while on this medication. See your dentist to have a dental exam and fix any dental problems before starting this medication. Take good care of your teeth while on this medication. Make sure you see your dentist for regular follow-up appointments. This medication may cause low levels of calcium  in your body. The risk of severe side effects is increased in people with kidney disease. Your care team may prescribe calcium  and vitamin D to help prevent low calcium  levels while you take this medication. It is important to take calcium  and vitamin D as directed by your care team. Talk to your care team if you may be pregnant. Serious birth defects may occur if you take this medication during pregnancy and for 5 months after the last dose. You will need a negative pregnancy test before starting this medication. Contraception  is recommended while taking this medication and for 5 months after the last dose. Your care team can help you find the option that works for you. Talk to your care team before breastfeeding. Changes to your treatment plan may be needed. What side effects may I notice from  receiving this medication? Side effects that you should report to your care team as soon as possible: Allergic reactions--skin rash, itching, hives, swelling of the face, lips, tongue, or throat Infection--fever, chills, cough, sore throat, wounds that don't heal, pain or trouble when passing urine, general feeling of discomfort or being unwell Low calcium  level--muscle pain or cramps, confusion, tingling, or numbness in the hands or feet Osteonecrosis of the jaw--pain, swelling, or redness in the mouth, numbness of the jaw, poor healing after dental work, unusual discharge from the mouth, visible bones in the mouth Severe bone, joint, or muscle pain Skin infection--skin redness, swelling, warmth, or pain Side effects that usually do not require medical attention (report these to your care team if they continue or are bothersome): Back pain Headache Joint pain Muscle pain Pain in the hands, arms, legs, or feet Runny or stuffy nose Sore throat This list may not describe all possible side effects. Call your doctor for medical advice about side effects. You may report side effects to FDA at 1-800-FDA-1088. Where should I keep my medication? This medication is given in a hospital or clinic. It will not be stored at home. NOTE: This sheet is a summary. It may not cover all possible information. If you have questions about this medicine, talk to your doctor, pharmacist, or health care provider.  2024 Elsevier/Gold Standard (2022-08-16 00:00:00)  Romosozumab Injection What is this medication? ROMOSOZUMAB (roe moe SOZ ue mab) prevents and treats osteoporosis. It works by Interior and spatial designer stronger and less likely to break (fracture). It is a monoclonal antibody. This medicine may be used for other purposes; ask your health care provider or pharmacist if you have questions. COMMON BRAND NAME(S): EVENITY What should I tell my care team before I take this medication? They need to know if you have  any of these conditions: Dental disease Heart attack Heart disease Kidney problems Low levels of calcium  in the blood On dialysis Stroke Wear dentures An unusual or allergic reaction to romosozumab, other medications, foods, dyes or preservatives Pregnant or trying to get pregnant Breast-feeding How should I use this medication? This medication is injected under the skin. It is given by your care team in a hospital or clinic setting. A special MedGuide will be given to you by the pharmacist with each prescription and refill. Be sure to read this information carefully each time. Talk to your care team about the use of this medication in children. Special care may be needed. Overdosage: If you think you have taken too much of this medicine contact a poison control center or emergency room at once. NOTE: This medicine is only for you. Do not share this medicine with others. What if I miss a dose? Keep appointments for follow-up doses. It is important not to miss your dose. Call your care team if you are unable to keep an appointment. What may interact with this medication? Interactions are not expected. This list may not describe all possible interactions. Give your health care provider a list of all the medicines, herbs, non-prescription drugs, or dietary supplements you use. Also tell them if you smoke, drink alcohol, or use illegal drugs. Some items may interact with your medicine. What  should I watch for while using this medication? Your condition will be monitored carefully while you are receiving this medication. You may need bloodwork while taking this medication. You should make sure you get enough calcium  and vitamin D while you are taking this medication. Discuss the foods you eat and the vitamins you take with your care team. Some people who take this medication have severe bone, joint, or muscle pain. This medication may also increase your risk for jaw problems or a broken thigh  bone. Tell your care team right away if you have severe pain in your jaw, bones, joints, or muscles. Tell you care team if you have any pain that does not go away or that gets worse. Tell your dentist and dental surgeon that you are taking this medication. You should not have major dental surgery while on this medication. See your dentist to have a dental exam and fix any dental problems before starting this medication. Take good care of your teeth while on this medication. Make sure you see your dentist for regular follow-up appointments. What side effects may I notice from receiving this medication? Side effects that you should report to your care team as soon as possible: Allergic reactions or angioedema--skin rash, itching or hives, swelling of the face, eyes, lips, tongue, arms, or legs, trouble swallowing or breathing Heart attack--pain or tightness in the chest, shoulders, arms, or jaw, nausea, shortness of breath, cold or clammy skin, feeling faint or lightheaded Low calcium  level--muscle pain or cramps, confusion, tingling, or numbness in the hands or feet Osteonecrosis of the jaw--pain, swelling, or redness in the mouth, numbness of the jaw, poor healing after dental work, unusual discharge from the mouth, visible bones in the mouth Severe bone, joint, or muscle pain Stroke--sudden numbness or weakness of the face, arm, or leg, trouble speaking, confusion, trouble walking, loss of balance or coordination, dizziness, severe headache, change in vision Side effects that usually do not require medical attention (report to your care team if they continue or are bothersome): Headache Joint pain Muscle spasms Pain, redness, or irritation at injection site Swelling of the ankles, hands, or feet This list may not describe all possible side effects. Call your doctor for medical advice about side effects. You may report side effects to FDA at 1-800-FDA-1088. Where should I keep my medication? This  medication is given in a hospital or clinic. It will not be stored at home. NOTE: This sheet is a summary. It may not cover all possible information. If you have questions about this medicine, talk to your doctor, pharmacist, or health care provider.  2024 Elsevier/Gold Standard (2021-08-11 00:00:00)

## 2024-01-23 ENCOUNTER — Ambulatory Visit: Admitting: Surgical

## 2024-01-23 ENCOUNTER — Telehealth: Payer: Self-pay | Admitting: Obstetrics and Gynecology

## 2024-01-23 ENCOUNTER — Other Ambulatory Visit: Payer: Self-pay | Admitting: Physician Assistant

## 2024-01-23 MED ORDER — NONFORMULARY OR COMPOUNDED ITEM
0 refills | Status: DC
Start: 2024-01-23 — End: 2024-05-22

## 2024-01-23 NOTE — Telephone Encounter (Signed)
 MyChart message to patient.   Encounter closed.

## 2024-01-23 NOTE — Telephone Encounter (Signed)
 Please let patient know that I will refill her testosterone  and have it faxed to [facility].  I gave her a smaller amount of 30 grams.   Yes, she may continue with the testosterone  and the Intrarosa .

## 2024-01-23 NOTE — Telephone Encounter (Signed)
 Last AEX 05/08/23 OV 01/22/24  Dr. Nikki -please review patients MyChart message and advise on Testosterone  Rx.

## 2024-01-25 ENCOUNTER — Other Ambulatory Visit

## 2024-01-25 DIAGNOSIS — M810 Age-related osteoporosis without current pathological fracture: Secondary | ICD-10-CM

## 2024-01-29 ENCOUNTER — Ambulatory Visit: Payer: Self-pay | Admitting: Obstetrics and Gynecology

## 2024-01-29 ENCOUNTER — Other Ambulatory Visit: Payer: Self-pay | Admitting: Physician Assistant

## 2024-01-29 ENCOUNTER — Telehealth: Payer: Self-pay | Admitting: Obstetrics and Gynecology

## 2024-01-29 NOTE — Telephone Encounter (Signed)
 Last Fill: 10/25/2023  Next Visit: 02/26/2024  Last Visit: 08/28/2023  Dx:  Limited systemic sclerosis   Current Dose per office note on 08/28/2023: Amlodipine  5mg  2.5 mg because tablets crumble   Okay to refill Amlodipine ?

## 2024-01-29 NOTE — Telephone Encounter (Signed)
 Please check to see if phosphorus can be added to labs already drawn on 01/25/24.   This was a future order placed.

## 2024-01-30 NOTE — Telephone Encounter (Signed)
 Per Nichole in lab, test was able to be added.

## 2024-02-01 LAB — PTH, INTACT AND CALCIUM
Calcium: 9.1 mg/dL (ref 8.6–10.4)
PTH: 22 pg/mL (ref 16–77)

## 2024-02-01 LAB — PHOSPHORUS: Phosphorus: 4.5 mg/dL — ABNORMAL HIGH (ref 2.1–4.3)

## 2024-02-05 ENCOUNTER — Encounter: Payer: Self-pay | Admitting: Obstetrics and Gynecology

## 2024-02-06 ENCOUNTER — Encounter: Payer: Self-pay | Admitting: Family Medicine

## 2024-02-06 NOTE — Telephone Encounter (Signed)
 Spoke with pt scheduling OV on 02/19/24 at 8:30 for elevated phosphorus.

## 2024-02-13 ENCOUNTER — Encounter: Payer: Self-pay | Admitting: Family Medicine

## 2024-02-13 ENCOUNTER — Other Ambulatory Visit: Payer: Self-pay | Admitting: Rheumatology

## 2024-02-13 DIAGNOSIS — G9332 Myalgic encephalomyelitis/chronic fatigue syndrome: Secondary | ICD-10-CM

## 2024-02-13 DIAGNOSIS — G894 Chronic pain syndrome: Secondary | ICD-10-CM

## 2024-02-13 NOTE — Telephone Encounter (Signed)
 Name of Medication:  MSIR Name of Pharmacy:  [facility] [address] Last Fill or Written Date and Quantity: 01/02/24, #90 Last Office Visit and Type:  01/10/24 CPE  Next Office Visit and Type:  02/19/24 f/u  Last Controlled Substance Agreement Date:  04/19/19 Last UDS:  04/19/19

## 2024-02-13 NOTE — Telephone Encounter (Signed)
 Last Fill: 11/24/2023  Next Visit: 03/11/2024  Last Visit: 08/28/2023  Dx: Fibromyalgia-   Current Dose per my chart note on 12/02/2023: Lyrica  dosage to 75mg , and try 1 in the morning, and 2 in the evening   Okay to refill Lyrica ?

## 2024-02-14 ENCOUNTER — Other Ambulatory Visit (INDEPENDENT_AMBULATORY_CARE_PROVIDER_SITE_OTHER)

## 2024-02-14 ENCOUNTER — Ambulatory Visit (INDEPENDENT_AMBULATORY_CARE_PROVIDER_SITE_OTHER): Admitting: Orthopedic Surgery

## 2024-02-14 DIAGNOSIS — M25561 Pain in right knee: Secondary | ICD-10-CM

## 2024-02-14 NOTE — Telephone Encounter (Signed)
 Name of Medication:  Adderall  XR Name of Pharmacy:  [facility] [address] Last Fill or Written Date and Quantity: 08/24/23, #90 Last Office Visit and Type:  11/21/23  rib fx and PNA f/u Next Office Visit and Type:  02/19/24 Last Controlled Substance Agreement Date:  04/19/19 Last UDS:  04/19/19

## 2024-02-15 ENCOUNTER — Telehealth: Payer: Self-pay

## 2024-02-15 ENCOUNTER — Encounter: Payer: Self-pay | Admitting: Orthopedic Surgery

## 2024-02-15 ENCOUNTER — Encounter: Payer: Self-pay | Admitting: Family Medicine

## 2024-02-15 MED ORDER — AMPHETAMINE-DEXTROAMPHET ER 30 MG PO CP24: ORAL_CAPSULE | ORAL | 0 refills | Status: DC

## 2024-02-15 MED ORDER — MORPHINE SULFATE 15 MG PO TABS: ORAL_TABLET | ORAL | 0 refills | Status: DC

## 2024-02-15 NOTE — Progress Notes (Signed)
 Office Visit Note   Patient: Rachel Duran           Date of Birth: [birth year]           MRN: [id number] Visit Date: 02/14/2024 Requested by: Rilla Baller, MD [address] [place],  [place] [address] PCP: Rilla Baller, MD  Subjective: Chief Complaint  Patient presents with   Right Knee - Pain    HPI: Rachel Duran is a 68 y.o. female who presents to the office reporting right knee pain of 8 weeks duration.  Denies any history of injury.  Feels like something has shifted in her knee.  She has a history of patellofemoral replacement in 2021.  Steps are little bit difficult for her.  She does do walking for exercise.  Takes morphine  sulfate twice a day for scoliosis and other health issues.  Pain does not wake her from sleep.  No radicular symptoms..                ROS: All systems reviewed are negative as they relate to the chief complaint within the history of present illness.  Patient denies fevers or chills.  Assessment & Plan: Visit Diagnoses:  1. Right knee pain, unspecified chronicity     Plan: Impression is right knee pain with history of some medial compartment wear and tear when she had an arthroscopy about a year after surgery.  Radiographically the knee looks good today.  No effusion.  Good range of motion.  Nothing really actionable at this time regarding the knee.  Would not inject that knee since it has prosthetic implants.  Plan to see her back as needed.  Follow-Up Instructions: No follow-ups on file.   Orders:  Orders Placed This Encounter  Procedures   XR KNEE 3 VIEW RIGHT   No orders of the defined types were placed in this encounter.     Procedures: No procedures performed   Clinical Data: No additional findings.  Objective: Vital Signs: LMP 07/25/1998   Physical Exam:  Constitutional: Patient appears well-developed HEENT:  Head: Normocephalic Eyes:EOM are normal Neck: Normal range of motion Cardiovascular:  Normal rate Pulmonary/chest: Effort normal Neurologic: Patient is alert Skin: Skin is warm Psychiatric: Patient has normal mood and affect  Ortho Exam: Ortho exam demonstrates normal gait and alignment.  Excellent range of motion of the right knee which is full.  No effusion or warmth.  Negative apprehension.  No real patellofemoral crepitus with passive range of motion.  Has medial greater than lateral joint line tenderness.  Specialty Comments:  No specialty comments available.  Imaging: XR KNEE 3 VIEW RIGHT Result Date: 02/15/2024 AP lateral merchant radiographs right knee reviewed.  Patellofemoral arthroplasty in good position alignment complicating features.  No lucencies noted in the femoral component or the patellar component.  Joint spaces maintained in the medial and lateral compartments.    PMFS History: Patient Active Problem List   Diagnosis Date Noted   Fall with injury 11/21/2023   Bilateral pneumonia 11/21/2023   Head trauma, initial encounter 11/06/2023   Chronic cough 06/20/2023   Onycholysis 04/10/2023   Grieving 06/27/2022   Elevated blood pressure reading in office without diagnosis of hypertension 05/31/2022   Closed nondisplaced fracture of lateral end of left clavicle 05/30/2022   Fusion of spine, cervical region    Cervical spine arthritis with nerve pain 12/29/2021   C6 cervical fracture (HCC) 12/29/2021   C5 cervical fracture (HCC) 12/21/2021   Advanced directives, counseling/discussion 09/23/2021  Swelling of right index finger 07/14/2021   Synovitis of right knee    Scoliosis 01/13/2021   Osteoporosis 07/02/2020   Status post left knee surgery 06/01/2020   Pulmonary nodule 05/27/2020   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   Right knee pain 05/05/2020   Positive ANA (antinuclear antibody) 06/05/2019   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   Unilateral primary osteoarthritis, left hip 01/28/2019   Common bile duct (CBD)  obstruction    Venous insufficiency of left lower extremity 07/04/2018   Dyslipidemia 09/22/2017   DDD (degenerative disc disease), cervical 04/03/2017   DDD (degenerative disc disease), lumbar 04/03/2017   Onychomycosis 03/21/2017   Dysphagia 10/18/2016   Encounter for chronic pain management 10/18/2016   Biliary stasis 09/26/2016   Dry mouth 06/06/2016   HNP (herniated nucleus pulposus), lumbar 09/23/2015   Iron deficiency 07/30/2015   Health maintenance examination 06/15/2015   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   Clavicle enlargement 12/13/2014   Prediabetes 12/13/2014   Plantar fasciitis, bilateral 09/11/2014   Medicare annual wellness visit, subsequent 06/10/2014   Abnormal thyroid  function test 06/10/2014   Chronic pain syndrome 06/10/2014   CFS (chronic fatigue syndrome) 04/08/2014   Postmenopausal atrophic vaginitis 10/19/2012   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   High-tone pelvic floor dysfunction 06/04/2011   Irritable bowel syndrome 06/04/2011   Cervical disc disorder with radiculopathy of cervical region 05/28/2010   APHTHOUS ULCERS 01/31/2008   Insomnia 11/09/2007   Constipation 10/11/2007   Allergic rhinitis 04/12/2007   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 C6- C7 -hx. past fusion(bone graft used)   Cholecystitis    Chronic pain    DDD (degenerative disc disease), lumbar 09/2015   dextroscoliosis with multilevel DDD and facet arthrosis most notable for R  foraminal disc protrusion L4/5 producing severe R neural foraminal stenosis abutting R L4 nerve root, moderate spinal canal and mild lat recesss and R neural foraminal stenosis L3/4 (MRI)   Depression    bipolar depression   Disorders of porphyrin metabolism    Felon of finger of left hand 11/10/2016   Fibromyalgia    GERD (gastroesophageal reflux disease)    Headache    occasionally   Hyperphosphatemia 2025   level 4.5   Internal hemorrhoids    Interstitial cystitis 06/06/2012   hx.   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 (Dr Jarold retinologist)  Tuberculosis     Family History  Problem Relation Age of Onset   CAD Father 40       MI, nonsmoker   Esophageal cancer Father 45   Stomach cancer Father    Scleroderma Mother    Hypertension Mother    Esophageal cancer Paternal Grandfather    Stomach cancer Paternal Grandfather    Diabetes Maternal Grandmother    Arthritis Brother    Stroke Neg Hx    Colon cancer Neg Hx    Rectal cancer Neg Hx     Past Surgical History:  Procedure Laterality Date   ANTERIOR CERVICAL DECOMP/DISCECTOMY FUSION  2004   C5/6, C6/7   ANTERIOR CERVICAL DECOMP/DISCECTOMY FUSION  02/2016   C3/4, C4/5 with plating Gavin)   ANTERIOR CERVICAL DECOMP/DISCECTOMY FUSION  12/29/2021   Procedure: ANTERIOR CERVICAL DECOMPRESSION/ DISCECTOMY FUSION CERVICAL THREE - SEVEN;  Surgeon: Mavis Purchase, MD;  Location: [facility] OR;  Service: Neurosurgery;;   ANTERIOR CERVICAL DECOMPRESSION/DISCECTOMY FUSION 4  LEVELS N/A 12/29/2021   Procedure: CERVICAL FIVE-SIX CORPECTOMY,;  Surgeon: Mavis Purchase, MD;  Location: [facility] OR;  Service: Neurosurgery;  Laterality: N/A;   AUGMENTATION MAMMAPLASTY Bilateral 03/25/2010   BREAST ENHANCEMENT SURGERY  2010   BREAST IMPLANT EXCHANGE  10/2014   exchange saline implants, B mastopexy/capsulorraphy ([facility])   BUNIONECTOMY Bilateral yrs ago   CESAREAN SECTION  1985   x 1   CHOLECYSTECTOMY  06/11/2012   Procedure: LAPAROSCOPIC CHOLECYSTECTOMY WITH INTRAOPERATIVE CHOLANGIOGRAM;  Surgeon: Camellia CHRISTELLA Blush, MD,FACS;  Location: [facility] ORS;  Service: General;  Laterality: N/A;   COLONOSCOPY  02/2018   done for positive cologuard - WNL, rpt 10 yrs Oma)   CYSTOSCOPY     ERCP  05/22/2012   Procedure: ENDOSCOPIC RETROGRADE CHOLANGIOPANCREATOGRAPHY (ERCP);  Surgeon: Gwendlyn ONEIDA Buddy, MD,FACG;  Location: [facility];  Service: Endoscopy;  Laterality: N/A;   ERCP N/A 09/17/2013   Procedure: ENDOSCOPIC RETROGRADE CHOLANGIOPANCREATOGRAPHY (ERCP);  Surgeon: Gwendlyn ONEIDA Buddy, MD;  Location: [facility];  Service: Endoscopy;  Laterality: N/A;   ERCP N/A 09/27/2018   Procedure: ENDOSCOPIC RETROGRADE CHOLANGIOPANCREATOGRAPHY (ERCP);  Surgeon: Buddy Gwendlyn ONEIDA, MD;  Location: [facility];  Service: Endoscopy;  Laterality: N/A;   ESOPHAGOGASTRODUODENOSCOPY  09/2016   WNL. esophagus dilated Oma)   HEMORRHOID BANDING  09/23/2013   --Dr. Camellia Blush   HERNIA REPAIR     inguinal   HYSTEROSCOPY W/ ENDOMETRIAL ABLATION     KNEE ARTHROSCOPY Right 04/06/2021   Procedure: RIGHT KNEE ARTHROSCOPY WITH DEBRIDEMENT;  Surgeon: Addie Cordella Hamilton, MD;  Location: [facility];  Service: Orthopedics;  Laterality: Right;   NASAL SINUS SURGERY     x5   PARTIAL KNEE ARTHROPLASTY Right 06/01/2020   Procedure: Right knee patellofemoral replacement;  Surgeon: Addie Cordella Hamilton, MD;  Location: [facility];  Service: Orthopedics;  Laterality: Right;   POSTERIOR  CERVICAL FUSION/FORAMINOTOMY N/A 12/29/2021   Procedure: POSTERIOR CERVICAL FUSION CERVICAL THREE-SEVEN;  Surgeon: Mavis Purchase, MD;  Location: [facility] OR;  Service: Neurosurgery;  Laterality: N/A;   REMOVAL OF STONES  09/27/2018   Procedure: REMOVAL OF STONES;  Surgeon: Buddy Gwendlyn ONEIDA, MD;  Location: [facility];  Service: Endoscopy;;   SPHINCTEROTOMY  09/27/2018   Procedure: ANNETT;  Surgeon: Buddy Gwendlyn ONEIDA, MD;  Location: [facility];  Service: Endoscopy;;   TONSILLECTOMY     removed as a child   TOTAL HIP ARTHROPLASTY Left 03/12/2019   Procedure: LEFT TOTAL HIP ARTHROPLASTY ANTERIOR APPROACH;  Jan, Lonni GRADE, MD)  UPPER GASTROINTESTINAL ENDOSCOPY     Social History   Occupational History   Occupation: retired    Associate Professor: UNEMPLOYED  Tobacco Use   Smoking status: Never    Passive exposure: Never   Smokeless tobacco: Never  Vaping Use   Vaping status: Never Used  Substance and Sexual Activity   Alcohol use: No    Alcohol/week: 0.0 standard drinks of alcohol   Drug use: No   Sexual activity: Yes    Partners: Male    Birth control/protection: Post-menopausal    Comment: vasectomy, 1st intercourse- 52 y.o., partner- 1, DES exposure unknown

## 2024-02-15 NOTE — Telephone Encounter (Signed)
 ERx

## 2024-02-15 NOTE — Telephone Encounter (Signed)
 Unable to send electronically - will try again first thing tomorrow morning.

## 2024-02-15 NOTE — Telephone Encounter (Signed)
 Copied from CRM [id number]. Topic: Clinical - Prescription Issue >> Feb 15, 2024  2:24 PM Rachel Duran wrote: Reason for CRM: patient called stating her morphine  (MSIR) 15 MG tablet medication is on nationwide back order but the [facility] [address] have the MSER 30mg  and they can do a one month supply for her. Patient stated she need it sent in today because it is first come  first served. Patient only has seven pills left CB [phone]

## 2024-02-15 NOTE — Telephone Encounter (Addendum)
 I am unable to send in [facility] tonight electronically - will try again tomorrow.  See mychart message.

## 2024-02-16 MED ORDER — MORPHINE SULFATE ER 30 MG PO TBCR
30.0000 mg | EXTENDED_RELEASE_TABLET | Freq: Every day | ORAL | 0 refills | Status: DC
Start: 2024-02-16 — End: 2024-03-11

## 2024-02-19 ENCOUNTER — Ambulatory Visit (INDEPENDENT_AMBULATORY_CARE_PROVIDER_SITE_OTHER): Admitting: Family Medicine

## 2024-02-19 ENCOUNTER — Encounter: Payer: Self-pay | Admitting: Family Medicine

## 2024-02-19 DIAGNOSIS — R7612 Nonspecific reaction to cell mediated immunity measurement of gamma interferon antigen response without active tuberculosis: Secondary | ICD-10-CM

## 2024-02-19 DIAGNOSIS — G9332 Myalgic encephalomyelitis/chronic fatigue syndrome: Secondary | ICD-10-CM

## 2024-02-19 DIAGNOSIS — M25561 Pain in right knee: Secondary | ICD-10-CM

## 2024-02-19 DIAGNOSIS — G894 Chronic pain syndrome: Secondary | ICD-10-CM

## 2024-02-19 DIAGNOSIS — G8929 Other chronic pain: Secondary | ICD-10-CM

## 2024-02-19 DIAGNOSIS — F3189 Other bipolar disorder: Secondary | ICD-10-CM

## 2024-02-19 LAB — BASIC METABOLIC PANEL WITH GFR
BUN: 8 mg/dL (ref 6–23)
CO2: 30 meq/L (ref 19–32)
Calcium: 9.3 mg/dL (ref 8.4–10.5)
Chloride: 99 meq/L (ref 96–112)
Creatinine, Ser: 0.55 mg/dL (ref 0.40–1.20)
GFR: 95.75 mL/min (ref >60.00)
Glucose, Bld: 103 mg/dL — ABNORMAL HIGH (ref 70–99)
Potassium: 4.4 meq/L (ref 3.5–5.1)
Sodium: 136 meq/L (ref 135–145)

## 2024-02-19 LAB — PHOSPHORUS: Phosphorus: 4.2 mg/dL (ref 2.3–4.6)

## 2024-02-19 MED ORDER — AMPHETAMINE-DEXTROAMPHET ER 30 MG PO CP24: ORAL_CAPSULE | ORAL | 0 refills | Status: DC

## 2024-02-19 NOTE — Patient Instructions (Addendum)
 Labs today to recheck phosphorus levels as well as kidney function.

## 2024-02-19 NOTE — Progress Notes (Unsigned)
 Ph: [phone] Fax: [phone]   Patient ID: Rachel Duran, female    DOB: September 17, 1955, 68 y.o.   MRN: [id number]  This visit was conducted in person.  BP 118/76   Pulse 88   Temp 98.6 F (37 C) (Oral)   Ht 5' 2.5 (1.588 m)   Wt 121 lb (54.9 kg)   LMP 07/25/1998   SpO2 98%   BMI 21.78 kg/m    CC: discuss abnormal lab Subjective:   HPI: Rachel Duran is a 68 y.o. female presenting on 02/19/2024 for Medical Management of Chronic Issues   Recent labs with GYN showing elevated phosphorus levels.  Discussing Evenity monthly injections x1 yr.   She is on linzess  and movantik  which may contribute to this.  Lab Results  Component Value Date   VD25OH 50.56 10/23/2023   Lab Results  Component Value Date   NA 135 10/23/2023   CL 99 10/23/2023   K 5.0 10/23/2023   CO2 29 10/23/2023   BUN 10 10/23/2023   CREATININE 0.62 10/23/2023   GFR 93.24 10/23/2023   CALCIUM 9.1 01/25/2024   PHOS 4.5 (H) 01/25/2024   ALBUMIN  4.3 10/23/2023   GLUCOSE 104 (H) 10/23/2023   Lab Results  Component Value Date   PTH 22 01/25/2024   CALCIUM 9.1 01/25/2024   CAION 1.21 12/29/2021   PHOS 4.5 (H) 01/25/2024   Lab Results  Component Value Date   WBC 7.5 10/23/2023   HGB 12.8 10/23/2023   HCT 38.4 10/23/2023   MCV 86.1 10/23/2023   PLT 378.0 10/23/2023    She restarted lamictal  50mg  planning to titrate as needed - due to worsening anxiety. May need to restart abilify  - will f/u with Dr Vincente.   Saw dermatology Dr Gould - for rash around lips. Started desonide. + dry eyes and dry mouth.  Known scleroderma followed by rheumatology. Upcoming appt next month.  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   MSIR 15mg  takes BID.  MSIR nationwide shortage/backorder. Has not filled MS Contin  30mg  daily.   Trouble with other opiates previously tried - demerol (rash),  codeine /hydrocodone /oxycodone  (nausea/vomiting), nucynta  (leg jerking) and fentanyl  (cold, insomnia, fatigue).      Relevant past medical, surgical, family and social history reviewed and updated as indicated. Interim medical history since our last visit reviewed. Allergies and medications reviewed and updated. Outpatient Medications Prior to Visit  Medication Sig Dispense Refill   ALPRAZolam  (XANAX ) 1 MG tablet Take 0.5 tablets (0.5 mg total) by mouth at bedtime. 30 tablet 0   amLODipine  (NORVASC ) 2.5 MG tablet TAKE 1 TABLET BY MOUTH ONCE DAILY 90 tablet 0   amLODipine  (NORVASC ) 5 MG tablet TAKE 1 TABLET BY MOUTH DAILY 90 tablet 0   amphetamine -dextroamphetamine  (ADDERALL  XR) 30 MG 24 hr capsule TAKE 1 CAPSULE BY MOUTH EVERY MORNING 90 capsule 0   Calcium Carbonate-Vitamin D  (CALTRATE 600+D PO) Take 2 tablets by mouth daily.     clindamycin  (CLEOCIN ) 150 MG capsule Take 600mg  1 hour prior to dental procedure 10 capsule 0   cyanocobalamin  (VITAMIN B12) 1000 MCG/ML injection INJECT 1ML INTO THE MUSCLE EVERY 21 DAYSAS DIRECTED 4 mL 3   Eszopiclone  3 MG TABS Take 1 tablet (3 mg total) by mouth at bedtime. Take immediately before bedtime 90 tablet 0   ferrous sulfate  325 (65 FE) MG tablet Take 1 tablet (325 mg  total) by mouth 2 (two) times a week.     fluticasone  (FLONASE ) 50 MCG/ACT nasal spray Place 2 sprays into both nostrils daily. 16 g 6   lamoTRIgine  (LAMICTAL ) 25 MG tablet Take 50 mg by mouth daily.     lidocaine  (LIDODERM ) 5 % Place 3 patches onto the skin daily. Remove & Discard patch within 12 hours or as directed by MD 60 patch 0   lidocaine  (XYLOCAINE ) 5 % ointment Apply 1 Application topically as needed. Apply up to 4x/day- on LUE for nerve pain 50 g 6   linaclotide  (LINZESS ) 290 MCG CAPS capsule Take 1 capsule (290 mcg total) by mouth daily. 90 capsule 2   morphine  (MSIR) 15 MG tablet TAKE ONE TABLET BY MOUTH EVERY 6 HOURS AS NEEDED FOR SEVERE PAIN 90 tablet 0   naloxegol  oxalate  (MOVANTIK ) 25 MG TABS tablet Take 1 tablet (25 mg total) by mouth daily. 90 tablet 3   NONFORMULARY OR COMPOUNDED ITEM Testosterone  proprionate petrolatum (jar) 2% ointment  2 Grams S: Apply a small amount once daily x 5 days per week.  Disp:  30 grams, RF:  none. [facility]. 30 each 0   ondansetron  (ZOFRAN ) 4 MG tablet TAKE 1 TABLET BY MOUTH EVERY 8 HOURS AS NEEDED FOR NAUSEA AND VOMITING. 30 tablet 0   pantoprazole  (PROTONIX ) 40 MG tablet TAKE ONE TABLET BY MOUTH TWICE DAILY WITH MEALS 180 tablet 3   phenazopyridine  (PYRIDIUM ) 100 MG tablet Take 200 mg by mouth 3 (three) times daily as needed.     polyethylene glycol (MIRALAX  / GLYCOLAX ) 17 g packet Take 17 g by mouth 2 (two) times daily.     Prasterone  (INTRAROSA ) 6.5 MG INST UNWRAP AND INSERT 1 SUPPOSITORY  VAGINALLY WITH PROVIDED  APPLICATOR AT BEDTIME 90 each 3   pregabalin  (LYRICA ) 75 MG capsule TAKE 1 CAPSULE BY MOUTH EVERY MORNING AND TAKE 2 CAPSULES AT BEDTIME 90 capsule 0   tobramycin (TOBREX) 0.3 % ophthalmic solution SMARTSIG:In Eye(s)     ursodiol  (ACTIGALL ) 300 MG capsule Take 1 capsule (300 mg total) by mouth 2 (two) times daily. 180 capsule 3   desonide (DESOWEN) 0.05 % ointment Apply topically.     morphine  (MS CONTIN ) 30 MG 12 hr tablet Take 1 tablet (30 mg total) by mouth daily. (Patient not taking: Reported on 02/19/2024) 30 tablet 0   No facility-administered medications prior to visit.     Per HPI unless specifically indicated in ROS section below Review of Systems  Objective:  BP 118/76   Pulse 88   Temp 98.6 F (37 C) (Oral)   Ht 5' 2.5 (1.588 m)   Wt 121 lb (54.9 kg)   LMP 07/25/1998   SpO2 98%   BMI 21.78 kg/m   Wt Readings from Last 3 Encounters:  02/19/24 121 lb (54.9 kg)  01/10/24 120 lb 6 oz (54.6 kg)  11/21/23 119 lb 4 oz (54.1 kg)      Physical Exam Vitals and nursing note reviewed.  Constitutional:      Appearance: Normal appearance. She is not ill-appearing.  HENT:     Head:  Normocephalic and atraumatic.     Mouth/Throat:     Mouth: Mucous membranes are moist.     Pharynx: Oropharynx is clear. No oropharyngeal exudate or posterior oropharyngeal erythema.  Eyes:     Extraocular Movements: Extraocular movements intact.     Conjunctiva/sclera: Conjunctivae normal.     Pupils: Pupils are equal, round, and reactive to light.  Cardiovascular:     Rate and Rhythm: Normal rate and regular rhythm.     Pulses: Normal pulses.     Heart sounds: Normal heart sounds. No murmur heard. Pulmonary:     Effort: Pulmonary effort is normal. No respiratory distress.     Breath sounds: Normal breath sounds. No wheezing, rhonchi or rales.  Musculoskeletal:     Right lower leg: No edema.     Left lower leg: No edema.  Skin:    General: Skin is warm and dry.     Findings: No rash.  Neurological:     Mental Status: She is alert.  Psychiatric:        Mood and Affect: Mood normal.        Behavior: Behavior normal.       Results for orders placed or performed in visit on 01/25/24  PTH, Intact and Calcium   Collection Time: 01/25/24 10:57 AM  Result Value Ref Range   PTH 22 16 - 77 pg/mL   Calcium 9.1 8.6 - 10.4 mg/dL  Phosphorus   Collection Time: 01/25/24 10:57 AM  Result Value Ref Range   Phosphorus 4.5 (H) 2.1 - 4.3 mg/dL   *Note: Due to a large number of results and/or encounters for the requested time period, some results have not been displayed. A complete set of results can be found in Results Review.    Assessment & Plan:   Problem List Items Addressed This Visit   None Visit Diagnoses       Hyperphosphatemia    -  Primary   Relevant Orders   Phosphorus        No orders of the defined types were placed in this encounter.   Orders Placed This Encounter  Procedures   Phosphorus    There are no Patient Instructions on file for this visit.  Follow up plan: No follow-ups on file.  Anton Blas, MD

## 2024-02-20 ENCOUNTER — Ambulatory Visit: Payer: Self-pay | Admitting: Family Medicine

## 2024-02-20 NOTE — Assessment & Plan Note (Signed)
Adderall XR refilled. 

## 2024-02-20 NOTE — Assessment & Plan Note (Addendum)
 No records of actual initial TB gold assay 01/2012.  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 She did undergo ERCP and cholecystectomy after this as well. 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  Saw ID Dr Luiz 03/2013 - plan at that time was 2-4 months of rifampin  however was also unable to tolerate this treatment.   Could consider rpt TB gold assay however it seems rheum would not be on board to treat with biologics

## 2024-02-20 NOTE — Assessment & Plan Note (Addendum)
 Worsening acute on chronic pain, sees ortho.

## 2024-02-20 NOTE — Assessment & Plan Note (Signed)
 Deteriorated depression/anxiety - back on lamictal , to discuss with psych about restarting abilify .

## 2024-02-20 NOTE — Assessment & Plan Note (Addendum)
 Mildly elevated phosphorus levels of unclear cause.  Update phosphorus levels along with kidney function.  Laxatives could contribute to increase in phosphate - she is on linzess  and movantik .  Kidney function, vit D levels have been stable.  Consider low phosphorus diet pending repeat labs.

## 2024-02-20 NOTE — Assessment & Plan Note (Addendum)
 [place] shortage of MSIR.  Limited options given trouble with other opiates previously tried - demerol (rash), codeine /hydrocodone /oxycodone  (nausea/vomiting), nucynta  (leg jerking) and fentanyl  (cold, insomnia, fatigue).  She currently takes MSIR 15mg  BID on average.  Reasonable to try MS Contin  30mg  once daily in its place - she will pick up from pharmacy.

## 2024-02-26 ENCOUNTER — Ambulatory Visit: Payer: Medicare Other | Admitting: Physician Assistant

## 2024-02-26 ENCOUNTER — Other Ambulatory Visit: Payer: Self-pay | Admitting: *Deleted

## 2024-02-26 DIAGNOSIS — M810 Age-related osteoporosis without current pathological fracture: Secondary | ICD-10-CM

## 2024-02-26 MED ORDER — ROMOSOZUMAB-AQQG 105 MG/1.17ML ~~LOC~~ SOSY
210.0000 mg | PREFILLED_SYRINGE | Freq: Once | SUBCUTANEOUS | Status: AC
Start: 2024-03-11 — End: 2024-04-05
  Administered 2024-04-05: 210 mg via SUBCUTANEOUS

## 2024-02-26 NOTE — Progress Notes (Signed)
 Office Visit Note  Patient: Rachel Duran             Date of Birth: [birth year]           MRN: [id number]             PCP: Rilla Baller, MD Referring: Rilla Baller, MD Visit Date: 03/11/2024 Occupation: @GUAROCC @  Subjective:  Routine follow up    History of Present Illness: Rachel Duran is a 68 y.o. female with history of limited systemic sclerosis.  Patient is not currently taking immunosuppressive agents.  She remains on amlodipine  7.5 mg daily for management of [name] phenomenon.  She continues to experience intermittent episodes of [name] despite the current treatment regimen.  Patient has remained under the care of Dr. Robinson her dermatologist.  She denies any recent rashes.   She has noticed increased mouth dryness despite using over-the-counter products for symptomatic relief and drinking water  throughout the day.  Patient states that she was having significant with dryness as well due to excessively licking her lips.  Patient was evaluated by Dr. Robinson and was prescribed desonide ointment, which was helpful.  Patient was curious if she had been tested for Sjogren's syndrome in the past. Patient has upcoming appointment scheduled with Dr. Theophilus for routine follow-up to assess for interstitial lung disease.  She denies any new or worsening pulmonary symptoms. Patient states that her symptoms of reflux have been well-controlled taking Protonix .  Her symptoms of constipation have been well-controlled taking Linzess . Patient continues to have chronic pain in her neck and upper back.  She has considered proceeding with injections in the future if her symptoms persist.  She has been taking Lyrica  75 mg 3 times daily for symptomatic relief.  She requested a refill of Lyrica  today. She continues to have chronic pain in the right knee which has been partially replaced.  Patient states that she has been told by Dr. Addie that she will require a total knee replacement  but is not yet ready to proceed with surgical intervention since she is caring for her 3 young grandchildren. Patient states that she has been diagnosed with osteoporosis.  She is currently awaiting approval to start Evenity  which she will take for 1 year followed by Prolia injections every 6 months.    Activities of Daily Living:  Patient reports morning stiffness for 2 hours.   Patient Reports nocturnal pain.  Difficulty dressing/grooming: Reports Difficulty climbing stairs: Reports Difficulty getting out of chair: Reports Difficulty using hands for taps, buttons, cutlery, and/or writing: Reports  Review of Systems  Constitutional:  Positive for fatigue.  HENT:  Positive for mouth sores and mouth dryness.   Eyes:  Positive for dryness.  Respiratory:  Negative for shortness of breath.   Cardiovascular:  Negative for chest pain and palpitations.  Gastrointestinal:  Positive for constipation. Negative for blood in stool and diarrhea.  Endocrine: Negative for increased urination.  Genitourinary:  Negative for involuntary urination.  Musculoskeletal:  Positive for joint pain, gait problem, joint pain, myalgias, muscle weakness, morning stiffness, muscle tenderness and myalgias. Negative for joint swelling.  Skin:  Positive for rash and sensitivity to sunlight. Negative for color change and hair loss.  Allergic/Immunologic: Negative for susceptible to infections.  Neurological:  Negative for dizziness and headaches.  Hematological:  Negative for swollen glands.  Psychiatric/Behavioral:  Positive for sleep disturbance. Negative for depressed mood. The patient is nervous/anxious.     PMFS History:  Patient Active Problem List  Diagnosis Date Noted   Hyperphosphatemia 02/20/2024   Fall with injury 11/21/2023   Bilateral pneumonia 11/21/2023   Head trauma, initial encounter 11/06/2023   Chronic cough 06/20/2023   Onycholysis 04/10/2023   Grieving 06/27/2022   Elevated blood pressure  reading in office without diagnosis of hypertension 05/31/2022   Closed nondisplaced fracture of lateral end of left clavicle 05/30/2022   Fusion of spine, cervical region    Cervical spine arthritis with nerve pain 12/29/2021   C6 cervical fracture (HCC) 12/29/2021   C5 cervical fracture (HCC) 12/21/2021   Advanced directives, counseling/discussion 09/23/2021   Swelling of right index finger 07/14/2021   Synovitis of right knee    Scoliosis 01/13/2021   Osteoporosis 07/02/2020   Status post left knee surgery 06/01/2020   Pulmonary nodule 05/27/2020   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   Right knee pain 05/05/2020   Positive ANA (antinuclear antibody) 06/05/2019   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   Unilateral primary osteoarthritis, left hip 01/28/2019   Common bile duct (CBD) obstruction    Venous insufficiency of left lower extremity 07/04/2018   Dyslipidemia 09/22/2017   DDD (degenerative disc disease), cervical 04/03/2017   DDD (degenerative disc disease), lumbar 04/03/2017   Onychomycosis 03/21/2017   Dysphagia 10/18/2016   Encounter for chronic pain management 10/18/2016   Biliary stasis 09/26/2016   Dry mouth 06/06/2016   HNP (herniated nucleus pulposus), lumbar 09/23/2015   Iron deficiency 07/30/2015   Health maintenance examination 06/15/2015   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   Clavicle enlargement 12/13/2014   Prediabetes 12/13/2014   Plantar fasciitis, bilateral 09/11/2014   Medicare annual wellness visit, subsequent 06/10/2014   Abnormal thyroid  function test 06/10/2014   Chronic pain syndrome 06/10/2014   CFS (chronic fatigue syndrome) 04/08/2014   Postmenopausal atrophic vaginitis 10/19/2012   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   High-tone pelvic floor dysfunction 06/04/2011   Irritable bowel syndrome 06/04/2011   Cervical disc disorder with radiculopathy of cervical region 05/28/2010   APHTHOUS ULCERS 01/31/2008    Insomnia 11/09/2007   Constipation 10/11/2007   Allergic rhinitis 04/12/2007   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 C6- C7 -hx. past fusion(bone graft used)   Cholecystitis    Chronic pain    DDD (degenerative disc disease), lumbar 09/2015   dextroscoliosis with multilevel DDD and facet arthrosis most notable for R foraminal disc protrusion L4/5 producing severe R neural foraminal stenosis abutting R L4 nerve root, moderate spinal canal and mild lat recesss and R neural foraminal stenosis L3/4 (MRI)   Depression    bipolar depression   Disorders of porphyrin metabolism    Felon of finger of left hand 11/10/2016   Fibromyalgia    GERD (gastroesophageal reflux disease)    Headache    occasionally   Hyperphosphatemia 2025   level 4.5   Internal hemorrhoids    Interstitial cystitis 06/06/2012   hx.   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 (Dr Jarold retinologist)   Tuberculosis     Family History  Problem Relation Age of Onset   CAD  Father 36       MI, nonsmoker   Esophageal cancer Father 80   Stomach cancer Father    Scleroderma Mother    Hypertension Mother    Esophageal cancer Paternal Grandfather    Stomach cancer Paternal Grandfather    Diabetes Maternal Grandmother    Arthritis Brother    Stroke Neg Hx    Colon cancer Neg Hx    Rectal cancer Neg Hx    Past Surgical History:  Procedure Laterality Date   ANTERIOR CERVICAL DECOMP/DISCECTOMY FUSION  2004   C5/6, C6/7   ANTERIOR CERVICAL DECOMP/DISCECTOMY FUSION  02/2016   C3/4, C4/5 with plating Gavin)   ANTERIOR CERVICAL DECOMP/DISCECTOMY FUSION  12/29/2021   Procedure: ANTERIOR CERVICAL DECOMPRESSION/ DISCECTOMY FUSION CERVICAL THREE - SEVEN;  Surgeon: Mavis Purchase, MD;  Location[facility] OR;  Service: Neurosurgery;;   ANTERIOR CERVICAL DECOMPRESSION/DISCECTOMY FUSION 4 LEVELS N/A 12/29/2021   Procedure: CERVICAL FIVE-SIX CORPECTOMY,;  Surgeon: Mavis Purchase, MD;  Location: [facility] OR;  Service: Neurosurgery;  Laterality: N/A;   AUGMENTATION MAMMAPLASTY Bilateral 03/25/2010   BREAST ENHANCEMENT SURGERY  2010   BREAST IMPLANT EXCHANGE  10/2014   exchange saline implants, B mastopexy/capsulorraphy ([facility])   BUNIONECTOMY Bilateral yrs ago   CESAREAN SECTION  1985   x 1   CHOLECYSTECTOMY  06/11/2012   Procedure: LAPAROSCOPIC CHOLECYSTECTOMY WITH INTRAOPERATIVE CHOLANGIOGRAM;  Surgeon: Camellia CHRISTELLA Blush, MD,FACS;  Location: [facility] ORS;  Service: General;  Laterality: N/A;   COLONOSCOPY  02/2018   done for positive cologuard - WNL, rpt 10 yrs Oma)   CYSTOSCOPY     ERCP  05/22/2012   Procedure: ENDOSCOPIC RETROGRADE CHOLANGIOPANCREATOGRAPHY (ERCP);  Surgeon: Gwendlyn ONEIDA Buddy, MD,FACG;  Location: [facility];  Service: Endoscopy;  Laterality: N/A;   ERCP N/A 09/17/2013   Procedure: ENDOSCOPIC RETROGRADE CHOLANGIOPANCREATOGRAPHY (ERCP);  Surgeon: Gwendlyn ONEIDA Buddy, MD;  Location: [facility];  Service: Endoscopy;  Laterality: N/A;   ERCP N/A 09/27/2018    Procedure: ENDOSCOPIC RETROGRADE CHOLANGIOPANCREATOGRAPHY (ERCP);  Surgeon: Buddy Gwendlyn ONEIDA, MD;  Location: [facility];  Service: Endoscopy;  Laterality: N/A;   ESOPHAGOGASTRODUODENOSCOPY  09/2016   WNL. esophagus dilated Oma)   HEMORRHOID BANDING  09/23/2013   --Dr. Camellia Blush   HERNIA REPAIR     inguinal   HYSTEROSCOPY W/ ENDOMETRIAL ABLATION     KNEE ARTHROSCOPY Right 04/06/2021   Procedure: RIGHT KNEE ARTHROSCOPY WITH DEBRIDEMENT;  Surgeon: Addie Cordella Hamilton, MD;  Location: [facility];  Service: Orthopedics;  Laterality: Right;   NASAL SINUS SURGERY     x5   PARTIAL KNEE ARTHROPLASTY Right 06/01/2020   Procedure: Right knee patellofemoral replacement;  Surgeon: Addie Cordella Hamilton, MD;  Location:  SURGERY CENTER;  Service: Orthopedics;  Laterality: Right;   POSTERIOR CERVICAL FUSION/FORAMINOTOMY N/A 12/29/2021   Procedure:  POSTERIOR CERVICAL FUSION CERVICAL THREE-SEVEN;  Surgeon: Mavis Purchase, MD;  Location: [facility] OR;  Service: Neurosurgery;  Laterality: N/A;   REMOVAL OF STONES  09/27/2018   Procedure: REMOVAL OF STONES;  Surgeon: Aneita Gwendlyn DASEN, MD;  Location: [facility];  Service: Endoscopy;;   SPHINCTEROTOMY  09/27/2018   Procedure: SPHINCTEROTOMY;  Surgeon: Aneita Gwendlyn DASEN, MD;  Location: [facility];  Service: Endoscopy;;   TONSILLECTOMY     removed as a child   TOTAL HIP ARTHROPLASTY Left 03/12/2019   Procedure: LEFT TOTAL HIP ARTHROPLASTY ANTERIOR APPROACH;  Jan, Lonni GRADE, MD)   UPPER GASTROINTESTINAL ENDOSCOPY     Social History   Social History Narrative   Lives with husband and dog   Caring for 3 grandchildren after daughter Lenis) was killed 05/2022.   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   Activity: tries to walk dog (45 min 3x/wk), yoga   Diet: good water , fruits/vegetables daily      Rheum: [name]   Psychiatrist: Vincente   Surgery: Tanda   GIBETHA Aneita   Urology: Janit    Immunization History  Administered Date(s) Administered   Fluad Trivalent(High Dose 65+) 04/10/2023   Influenza Split 05/04/2011, 04/04/2012   Influenza Whole 05/25/2007, 04/28/2008, 04/30/2009, 03/26/2010   Influenza,inj,Quad PF,6+ Mos 04/16/2015, 04/15/2016, 05/16/2017, 04/10/2018, 04/19/2019, 04/07/2020, 04/20/2022   Influenza-Unspecified 03/25/2014   MMR 11/28/2017   Moderna Sars-Covid-2 Vaccination 09/02/2019, 09/30/2019, 04/07/2020   Pfizer(Comirnaty)Fall Seasonal Vaccine 12 years and older 05/25/2022   Pneumococcal Conjugate-13 08/02/2013   Pneumococcal Polysaccharide-23 07/30/2009   Td 11/01/2005, 11/10/2016   Zoster Recombinant(Shingrix) 07/21/2017, 11/03/2017     Objective: Vital Signs: BP 133/83 (BP Location: Left Arm, Patient Position: Sitting, Cuff Size: Normal)   Pulse 73   Resp 14   Ht 5' 2.5 (1.588 m)   Wt 126 lb (57.2 kg)   LMP 07/25/1998   BMI 22.68 kg/m    Physical Exam Vitals and nursing note reviewed.  Constitutional:      Appearance: She is well-developed.  HENT:     Head: Normocephalic and atraumatic.  Eyes:     Conjunctiva/sclera: Conjunctivae normal.  Cardiovascular:     Rate and Rhythm: Normal rate and regular rhythm.     Heart sounds: Normal heart sounds.  Pulmonary:     Effort: Pulmonary effort is normal.     Breath sounds: Normal breath sounds.  Abdominal:     General: Bowel sounds are normal.     Palpations: Abdomen is soft.  Musculoskeletal:     Cervical back: Normal range of motion.  Skin:    General: Skin is warm and dry.     Capillary Refill: Capillary refill takes less than 2 seconds.     Comments: Mild skin tightness distal to PIP joints. No telangiectasia were noted.  Possible early calcinosis cutis noted in the right 3rd fingertip-photo attached.   Neurological:     Mental Status: She is alert and oriented to person, place, and time.  Psychiatric:        Behavior: Behavior normal.      Musculoskeletal Exam: C-spine  has limited ROM with discomfort.  Severe thoracolumbar scoliosis noted. Shoulder joints, elbow joints, wrist joints, MCPs, PIPs, and DIPs good ROM with no synovitis.  Complete fist formation bilaterally.  PIP and DIP thickening consistent with osteoarthritis of both hands.  Left hip replacement  has good range of motion.  Discomfort range of motion of the partial right knee replacement.  Left knee has no  warmth or effusion.  Ankle joints have good range of motion with no tenderness or synovitis.  CDAI Exam: CDAI Score: -- Patient Global: --; Provider Global: -- Swollen: --; Tender: -- Joint Exam 03/11/2024   No joint exam has been documented for this visit   There is currently no information documented on the homunculus. Go to the Rheumatology activity and complete the homunculus joint exam.  Investigation: No additional findings.  Imaging: XR KNEE 3 VIEW RIGHT Result Date: 02/15/2024 AP lateral merchant radiographs right knee reviewed.  Patellofemoral arthroplasty in good position alignment complicating features.  No lucencies noted in the femoral component or the patellar component.  Joint spaces maintained in the medial and lateral compartments.   Recent Labs: Lab Results  Component Value Date   WBC 7.5 10/23/2023   HGB 12.8 10/23/2023   PLT 378.0 10/23/2023   NA 136 02/19/2024   K 4.4 02/19/2024   CL 99 02/19/2024   CO2 30 02/19/2024   GLUCOSE 103 (H) 02/19/2024   BUN 8 02/19/2024   CREATININE 0.55 02/19/2024   BILITOT 0.4 10/23/2023   ALKPHOS 111 10/23/2023   AST 16 10/23/2023   ALT 8 10/23/2023   PROT 6.5 10/23/2023   ALBUMIN  4.3 10/23/2023   CALCIUM 9.3 02/19/2024   GFRAA >60 03/13/2019    Speciality Comments: Amlodipine  5mg  2.5 mg because tablets crumble.   Procedures:  No procedures performed Allergies: Celebrex  [celecoxib ], Inh [isoniazid], Cymbalta  [duloxetine  hcl], Nucynta  [tapentadol ], Silenor  [doxepin  hcl], Benzoin, Duragesic -100 [fentanyl ], Keflex   [cephalexin ], Codeine phosphate, Demerol [meperidine hcl], Lithobid [lithium], and Sulfamethoxazole    Previous therapy: Plaquenil  and methotrexate   Assessment / Plan:     Visit Diagnoses: 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HCC) - 10/06/21: AVISE lupus index -1.1, ANA positive, titer negative, dsDNA equivocal, (RNP, Ro, SSB, Smith, centromere, SCL 70, RNA polymerase 3, Jo 1, CB Negative):  She continues to have intermittent symptoms of [name] phenomenon especially in her fingertips.  No digital ulcerations noted.  Mild skin tightness noted distal to PIP joints.  Possible early signs of calcinosis cutis-right 3rd fingertip-photo attached above.  Under care of Dr. Robinson.  She remains on amlodipine  7.5 mg daily--requested a refill of the 5 mg tablets which was sent to the pharmacy today. Blood pressure is currently well-controlled--she remains on amlodipine  7.5 mg daily. GI: Her symptoms of reflux are currently well-controlled taking Protonix .  No dysphagia.  Her symptoms of constipation have been well-controlled with the use of Linzess . Pulm: Under the care of Dr. Mannam-high-resolution chest CT obtained on 10/23/2023: No evidence of fibrotic interstitial lung disease.  No new or worsening pulmonary symptoms.  She is scheduled to follow-up with Dr. Theophilus on 04/15/2024 at which time pulmonary function testing will be updated. No synovitis noted on examination. Previous therapy includes: Plaquenil , methotrexate Discussed the option of CellCept in the future if needed especially if there is any evidence of ILD.  She does not necessarily require immunosuppression at this time and would like to hold off on adding any medications if possible. She is advised to notify us  if she develops any new or worsening symptoms. She will follow-up in the office in 6 months or sooner if needed. - Plan: CBC with Differential/Platelet, Urinalysis, Routine w reflex microscopic, Hepatic function panel, ANA, Sedimentation  rate  [name] disease without gangrene - Severe Raynauds symptoms--overall manageable with amlodipine  7.5 mg daily.  Sicca complex (HCC) -She has been experiencing increased sicca symptoms especially symptoms of dry mouth.  She noticed excessive lip dryness which  she attributed to licking her lips due to dryness.  She has been using over-the-counter products and was also evaluated by  her dermatologist, Dr. Robinson.  Prescribed desonide ointment to use PRN.   Discussed that Adderall  could be contributing to her symptoms of dry mouth.  SSA and SSB antibodies have been negative in the past but will be rechecked today with routine lab work.    Plan: Sjogrens syndrome-A extractable nuclear antibody, Sjogrens syndrome-B extractable nuclear antibody, ANA  Primary osteoarthritis of both hands: She has severe PIP and DIP thickening consistent with osteoarthritis of both hands.  No synovitis noted on exam.  Discussed the importance of joint protection and muscle strengthening.  S/P hip replacement, left: Doing well.  No groin pain.  Primary osteoarthritis of left knee: No warmth or effusion noted.  Status post right partial knee replacement: Under the care of Dr. Addie.  Patient continues to have persistent pain in the right partial knee replacement.  According to the patient Dr. Addie has advised that she will require a total knee replacement but she is trying to hold off at this time since she is caring for her 3 young grandchildren.  She occasionally wears a brace for support.  DDD (degenerative disc disease), cervical - History of cervical fusion performed by Dr. Mavis.  Limited range of motion.  Lumbar spondylosis: Thoracolumbar scoliosis noted.  Under the care of pain management.  Other form of scoliosis of thoracolumbar spine: Under the care of pain management.  Age-related osteoporosis without current pathological fracture -Previous DEXA 10/06/21: BMD Femur Total  BMD 0.704 g/cm2 with a T-score  of-2.4, BMD 0.704.  DEXA updated on 12/26/2023: Right femoral neck BMD 0.694 with T-score -2.5.  Left forearm BMD 0.646 with T-score -2.6. According to the patient the plan is for her to initiate Evenity  x 1 year if insurance approves.  She will then be transition to Prolia 60 mg subcu days injections every 6 months.  Fibromyalgia: She continues to experience interval myalgias and muscle tenderness due to fibromyalgia.  She remains on Lyrica  but has been taking Lyrica  3 times a day instead of 1 in the morning and 2 at bedtime.  She finds that her symptoms are better controlled with less breakthrough symptoms in the afternoon with the 3 times daily dosing.  A new prescription was sent to the pharmacy today as requested.  Discussed the importance of regular exercise and good sleep hygiene.  Chronic pain syndrome: She is taking Lyrica  75 mg 3 times daily.  She also remains on morphine  as prescribed by pain management.  Other fatigue: Chronic, stable.  Other medical conditions are listed as follows:   Prediabetes  Hx of porphyria  History of gastroesophageal reflux (GERD)  History of bipolar disorder  CFS (chronic fatigue syndrome)  Family history of scleroderma: Mother     Orders: Orders Placed This Encounter  Procedures   Sjogrens syndrome-A extractable nuclear antibody   Sjogrens syndrome-B extractable nuclear antibody   CBC with Differential/Platelet   Urinalysis, Routine w reflex microscopic   Hepatic function panel   ANA   Sedimentation rate   Meds ordered this encounter  Medications   amLODipine  (NORVASC ) 5 MG tablet    Sig: Take 1 tablet (5 mg total) by mouth daily.    Dispense:  90 tablet    Refill:  0   pregabalin  (LYRICA ) 75 MG capsule    Sig: Take 1 capsule (75 mg total) by mouth 3 (three) times daily.  Dispense:  90 capsule    Refill:  0     Follow-Up Instructions: Return in about 5 months (around 08/11/2024) for Systemic sclerosis.   Waddell CHRISTELLA Craze,  PA-C  Note - This record has been created using Dragon software.  Chart creation errors have been sought, but may not always  have been located. Such creation errors do not reflect on  the standard of medical care.

## 2024-03-07 ENCOUNTER — Encounter: Payer: Self-pay | Admitting: Obstetrics and Gynecology

## 2024-03-09 ENCOUNTER — Encounter: Payer: Self-pay | Admitting: Family Medicine

## 2024-03-11 ENCOUNTER — Ambulatory Visit: Attending: Physician Assistant | Admitting: Physician Assistant

## 2024-03-11 ENCOUNTER — Encounter: Payer: Self-pay | Admitting: Physician Assistant

## 2024-03-11 VITALS — BP 133/83 | HR 73 | Resp 14 | Ht 62.5 in | Wt 126.0 lb

## 2024-03-11 DIAGNOSIS — M810 Age-related osteoporosis without current pathological fracture: Secondary | ICD-10-CM

## 2024-03-11 DIAGNOSIS — M47816 Spondylosis without myelopathy or radiculopathy, lumbar region: Secondary | ICD-10-CM

## 2024-03-11 DIAGNOSIS — M19041 Primary osteoarthritis, right hand: Secondary | ICD-10-CM | POA: Diagnosis not present

## 2024-03-11 DIAGNOSIS — M1712 Unilateral primary osteoarthritis, left knee: Secondary | ICD-10-CM

## 2024-03-11 DIAGNOSIS — I7300 Raynaud's syndrome without gangrene: Secondary | ICD-10-CM | POA: Diagnosis not present

## 2024-03-11 DIAGNOSIS — M85851 Other specified disorders of bone density and structure, right thigh: Secondary | ICD-10-CM

## 2024-03-11 DIAGNOSIS — G894 Chronic pain syndrome: Secondary | ICD-10-CM

## 2024-03-11 DIAGNOSIS — Z96642 Presence of left artificial hip joint: Secondary | ICD-10-CM

## 2024-03-11 DIAGNOSIS — M3500 Sicca syndrome, unspecified: Secondary | ICD-10-CM

## 2024-03-11 DIAGNOSIS — R5383 Other fatigue: Secondary | ICD-10-CM

## 2024-03-11 DIAGNOSIS — G9332 Myalgic encephalomyelitis/chronic fatigue syndrome: Secondary | ICD-10-CM

## 2024-03-11 DIAGNOSIS — M349 Systemic sclerosis, unspecified: Secondary | ICD-10-CM | POA: Diagnosis not present

## 2024-03-11 DIAGNOSIS — Z8639 Personal history of other endocrine, nutritional and metabolic disease: Secondary | ICD-10-CM

## 2024-03-11 DIAGNOSIS — R7303 Prediabetes: Secondary | ICD-10-CM

## 2024-03-11 DIAGNOSIS — M797 Fibromyalgia: Secondary | ICD-10-CM

## 2024-03-11 DIAGNOSIS — Z8719 Personal history of other diseases of the digestive system: Secondary | ICD-10-CM

## 2024-03-11 DIAGNOSIS — M4185 Other forms of scoliosis, thoracolumbar region: Secondary | ICD-10-CM

## 2024-03-11 DIAGNOSIS — Z8269 Family history of other diseases of the musculoskeletal system and connective tissue: Secondary | ICD-10-CM

## 2024-03-11 DIAGNOSIS — Z8659 Personal history of other mental and behavioral disorders: Secondary | ICD-10-CM

## 2024-03-11 DIAGNOSIS — M5030 Other cervical disc degeneration, unspecified cervical region: Secondary | ICD-10-CM

## 2024-03-11 DIAGNOSIS — Z96651 Presence of right artificial knee joint: Secondary | ICD-10-CM

## 2024-03-11 DIAGNOSIS — M19042 Primary osteoarthritis, left hand: Secondary | ICD-10-CM

## 2024-03-11 MED ORDER — AMLODIPINE BESYLATE 5 MG PO TABS: 5.0000 mg | ORAL_TABLET | Freq: Every day | ORAL | 0 refills | Status: AC

## 2024-03-11 MED ORDER — PREGABALIN 75 MG PO CAPS: 75.0000 mg | ORAL_CAPSULE | Freq: Three times a day (TID) | ORAL | 0 refills | Status: DC

## 2024-03-11 NOTE — Telephone Encounter (Deleted)
 Name of Medication:  MSIR Name of Pharmacy:  [facility] [address] Last Fill or Written Date and Quantity:  11/17/23, #90 Last Office Visit and Type:  11/21/23, PNA & rib fx f/u Next Office Visit and Type:  01/10/24, 3 mo chronic pain f/u Last Controlled Substance Agreement Date:  04/19/19 Last UDS:  04/19/19

## 2024-03-11 NOTE — Addendum Note (Signed)
 Addended by: ALBINO SHAVER C on: 03/11/2024 10:59 AM   Modules accepted: Orders

## 2024-03-12 ENCOUNTER — Ambulatory Visit: Payer: Self-pay | Admitting: Physician Assistant

## 2024-03-12 NOTE — Progress Notes (Signed)
 Hepatic function panel WNL CBC WNL ESR WNL UA normal

## 2024-03-13 LAB — CBC WITH DIFFERENTIAL/PLATELET
Absolute Lymphocytes: 2278 {cells}/uL (ref 850–3900)
Absolute Monocytes: 507 {cells}/uL (ref 200–950)
Basophils Absolute: 53 {cells}/uL (ref 0–200)
Basophils Relative: 0.6 %
Eosinophils Absolute: 151 {cells}/uL (ref 15–500)
Eosinophils Relative: 1.7 %
HCT: 37.8 % (ref 35.0–45.0)
Hemoglobin: 12.2 g/dL (ref 11.7–15.5)
MCH: 28.2 pg (ref 27.0–33.0)
MCHC: 32.3 g/dL (ref 32.0–36.0)
MCV: 87.5 fL (ref 80.0–100.0)
MPV: 9.2 fL (ref 7.5–12.5)
Monocytes Relative: 5.7 %
Neutro Abs: 5910 {cells}/uL (ref 1500–7800)
Neutrophils Relative %: 66.4 %
Platelets: 296 Thousand/uL (ref 140–400)
RBC: 4.32 Million/uL (ref 3.80–5.10)
RDW: 13 % (ref 11.0–15.0)
Total Lymphocyte: 25.6 %
WBC: 8.9 Thousand/uL (ref 3.8–10.8)

## 2024-03-13 LAB — HEPATIC FUNCTION PANEL
AG Ratio: 2.3 (calc) (ref 1.0–2.5)
ALT: 7 U/L (ref 6–29)
AST: 17 U/L (ref 10–35)
Albumin: 4.5 g/dL (ref 3.6–5.1)
Alkaline phosphatase (APISO): 97 U/L (ref 37–153)
Bilirubin, Direct: 0.1 mg/dL (ref 0.0–0.2)
Globulin: 2 g/dL (ref 1.9–3.7)
Indirect Bilirubin: 0.3 mg/dL (ref 0.2–1.2)
Total Bilirubin: 0.4 mg/dL (ref 0.2–1.2)
Total Protein: 6.5 g/dL (ref 6.1–8.1)

## 2024-03-13 LAB — URINALYSIS, ROUTINE W REFLEX MICROSCOPIC
Bilirubin Urine: NEGATIVE
Glucose, UA: NEGATIVE
Hgb urine dipstick: NEGATIVE
Ketones, ur: NEGATIVE
Leukocytes,Ua: NEGATIVE
Nitrite: NEGATIVE
Protein, ur: NEGATIVE
Specific Gravity, Urine: 1.004 (ref 1.001–1.035)
pH: 6.5 (ref 5.0–8.0)

## 2024-03-13 LAB — SJOGRENS SYNDROME-A EXTRACTABLE NUCLEAR ANTIBODY: SSA (Ro) (ENA) Antibody, IgG: <1 AI

## 2024-03-13 LAB — ANTI-NUCLEAR AB-TITER (ANA TITER): ANA Titer 1: 1:80 {titer} — ABNORMAL HIGH

## 2024-03-13 LAB — ANA: Anti Nuclear Antibody (ANA): POSITIVE — AB

## 2024-03-13 LAB — SEDIMENTATION RATE: Sed Rate: 2 mm/h (ref 0–30)

## 2024-03-13 LAB — SJOGRENS SYNDROME-B EXTRACTABLE NUCLEAR ANTIBODY: SSB (La) (ENA) Antibody, IgG: <1 AI

## 2024-03-13 MED ORDER — MORPHINE SULFATE ER 30 MG PO TBCR: 30.0000 mg | EXTENDED_RELEASE_TABLET | Freq: Every day | ORAL | 0 refills | Status: DC

## 2024-03-13 NOTE — Progress Notes (Signed)
 Ro antibody negative  La antibody negative

## 2024-03-13 NOTE — Telephone Encounter (Signed)
 ERx

## 2024-03-14 ENCOUNTER — Telehealth: Payer: Self-pay | Admitting: *Deleted

## 2024-03-14 NOTE — Telephone Encounter (Signed)
 Message received from [facility] stating, Per the [facility] Part B Step Therapy Program for Prolia and Evenity , a non-preferred product has been requested. The preferred products are Alendronate, Ibandronate, Pamidronate, Risedronate, Zoledronic Acid. The policy requires history of treatment failure to an IV bisphosphonate.  Routing to provider to review and advise.

## 2024-03-14 NOTE — Progress Notes (Signed)
 ANA remains positive.

## 2024-03-15 NOTE — Telephone Encounter (Signed)
 Due to patient's history of prior fractures, the Evenity  is likely her best option.   She has an eye disease called Stargardt's disease which causes vision deterioration and vision loss.  The Reclast would not be ideal, because this can cause uveitis and scleritis.   She has reflux and is treated for this, so Fosamax would not be a first choice.   She has a history of vertebral fracture, so even the Prolia is not the first choice.  Please communicate this information to her insurance and start an appeal if needed.   Thank you so much.

## 2024-03-21 ENCOUNTER — Other Ambulatory Visit: Payer: Self-pay | Admitting: *Deleted

## 2024-03-21 DIAGNOSIS — M810 Age-related osteoporosis without current pathological fracture: Secondary | ICD-10-CM

## 2024-03-21 MED ORDER — ROMOSOZUMAB-AQQG 105 MG/1.17ML ~~LOC~~ SOSY
210.0000 mg | PREFILLED_SYRINGE | SUBCUTANEOUS | Status: DC
  Administered 2024-05-06 – 2024-07-15 (×2): 210 mg via SUBCUTANEOUS

## 2024-03-21 NOTE — Telephone Encounter (Signed)
 Evenity  has been approved by [facility]. Patient has been updated and scheduled for first Evenity  injection on 04/05/24.   Routing to provider as RICK.   Encounter closed.

## 2024-03-28 ENCOUNTER — Telehealth (HOSPITAL_COMMUNITY): Payer: Self-pay | Admitting: Internal Medicine

## 2024-03-28 ENCOUNTER — Other Ambulatory Visit (HOSPITAL_COMMUNITY): Payer: Self-pay | Admitting: *Deleted

## 2024-03-28 DIAGNOSIS — M349 Systemic sclerosis, unspecified: Secondary | ICD-10-CM

## 2024-03-29 ENCOUNTER — Encounter: Payer: Self-pay | Admitting: Family Medicine

## 2024-03-29 DIAGNOSIS — Z23 Encounter for immunization: Secondary | ICD-10-CM

## 2024-03-29 MED ORDER — COVID-19 MRNA VAC-TRIS(PFIZER) 30 MCG/0.3ML IM SUSY: 0.3000 mL | PREFILLED_SYRINGE | Freq: Once | INTRAMUSCULAR | 0 refills | Status: AC

## 2024-03-29 NOTE — Telephone Encounter (Signed)
 Prescription for COVID vaccine pended for your signature.  Per patient [facility] [address] is where she wants it sent.

## 2024-04-05 ENCOUNTER — Ambulatory Visit

## 2024-04-05 ENCOUNTER — Telehealth: Payer: Self-pay | Admitting: Family Medicine

## 2024-04-05 DIAGNOSIS — M810 Age-related osteoporosis without current pathological fracture: Secondary | ICD-10-CM

## 2024-04-05 NOTE — Telephone Encounter (Signed)
 Copied from CRM #[id number]. Topic: Clinical - Medical Advice >> Apr 05, 2024 12:11 PM Donna BRAVO wrote: Reason for CRM: patient got her Romosozumab -aqqg (EVENITY ) 105 MG/1. injection 210 mg injection and would like to know if there is  a waiting time before she can get her Covid vaccine.   Patient was informed their call will be returned before end of business day.

## 2024-04-05 NOTE — Telephone Encounter (Signed)
 No specific recommendations to wait any amount of time between Evenity  and COVID vaccines -should be safe to do this. I would suggest however waiting 1 wk to ensure tolerating Evenity  well especially if first time she's received Evenity .

## 2024-04-05 NOTE — Telephone Encounter (Signed)
 Called patient reviewed all information and repeated back to me. Will call if any questions.  ? ?

## 2024-04-08 ENCOUNTER — Encounter: Payer: Self-pay | Admitting: Family Medicine

## 2024-04-08 DIAGNOSIS — G4700 Insomnia, unspecified: Secondary | ICD-10-CM

## 2024-04-08 DIAGNOSIS — G894 Chronic pain syndrome: Secondary | ICD-10-CM

## 2024-04-09 NOTE — Telephone Encounter (Signed)
 Name of Medication: Morphine   Name of Pharmacy:  Last Fill or Written Date and Quantity: 02/15/24 #90 no rf  Last Office Visit and Type: 02/19/24  f/u  Next Office Visit and Type: f/u 04/10/24

## 2024-04-10 ENCOUNTER — Encounter: Payer: Self-pay | Admitting: Family Medicine

## 2024-04-10 ENCOUNTER — Ambulatory Visit (INDEPENDENT_AMBULATORY_CARE_PROVIDER_SITE_OTHER): Admitting: Family Medicine

## 2024-04-10 VITALS — BP 118/80 | HR 96 | Temp 98.8°F | Ht 62.5 in | Wt 123.4 lb

## 2024-04-10 DIAGNOSIS — J302 Other seasonal allergic rhinitis: Secondary | ICD-10-CM | POA: Diagnosis not present

## 2024-04-10 DIAGNOSIS — G4700 Insomnia, unspecified: Secondary | ICD-10-CM

## 2024-04-10 DIAGNOSIS — M349 Systemic sclerosis, unspecified: Secondary | ICD-10-CM

## 2024-04-10 DIAGNOSIS — M810 Age-related osteoporosis without current pathological fracture: Secondary | ICD-10-CM

## 2024-04-10 DIAGNOSIS — Z23 Encounter for immunization: Secondary | ICD-10-CM | POA: Diagnosis not present

## 2024-04-10 DIAGNOSIS — M792 Neuralgia and neuritis, unspecified: Secondary | ICD-10-CM

## 2024-04-10 DIAGNOSIS — M5010 Cervical disc disorder with radiculopathy, unspecified cervical region: Secondary | ICD-10-CM

## 2024-04-10 DIAGNOSIS — H3553 Other dystrophies primarily involving the sensory retina: Secondary | ICD-10-CM

## 2024-04-10 DIAGNOSIS — M47812 Spondylosis without myelopathy or radiculopathy, cervical region: Secondary | ICD-10-CM

## 2024-04-10 DIAGNOSIS — I7300 Raynaud's syndrome without gangrene: Secondary | ICD-10-CM

## 2024-04-10 DIAGNOSIS — G894 Chronic pain syndrome: Secondary | ICD-10-CM

## 2024-04-10 DIAGNOSIS — M797 Fibromyalgia: Secondary | ICD-10-CM

## 2024-04-10 DIAGNOSIS — G8929 Other chronic pain: Secondary | ICD-10-CM

## 2024-04-10 MED ORDER — MORPHINE SULFATE ER 30 MG PO TBCR: 30.0000 mg | EXTENDED_RELEASE_TABLET | Freq: Every day | ORAL | 0 refills | Status: DC

## 2024-04-10 MED ORDER — ESZOPICLONE 3 MG PO TABS: 3.0000 mg | ORAL_TABLET | Freq: Every day | ORAL | 0 refills | Status: DC

## 2024-04-10 NOTE — Assessment & Plan Note (Addendum)
 [place] CSRS reviewed. MSContin 30mg  refilled to [facility] Consider updated UDS next visit

## 2024-04-10 NOTE — Assessment & Plan Note (Signed)
 Regularly sees Dr Jarold Retina specialist receiving bilateral eye injections Q8 wks.

## 2024-04-10 NOTE — Assessment & Plan Note (Addendum)
 Continue regular rheumatology f/u.

## 2024-04-10 NOTE — Telephone Encounter (Signed)
 ERx

## 2024-04-10 NOTE — Addendum Note (Signed)
 Addended by: RILLA BALLER on: 04/10/2024 12:05 PM   Modules accepted: Orders

## 2024-04-10 NOTE — Telephone Encounter (Signed)
 Seen in office today - she also requested lunesta  refilled.

## 2024-04-10 NOTE — Assessment & Plan Note (Signed)
 Tolerated recent 1st Evenity  injection well.

## 2024-04-10 NOTE — Telephone Encounter (Unsigned)
 Copied from CRM [id number]. Topic: Clinical - Prescription Issue >> Apr 10, 2024 12:33 PM Chiquita SQUIBB wrote: Reason for CRM: Misty from [facility] is calling in stating the morphine  (MS CONTIN ) 30 MG 12 hr tablet [[id number]] should have gone to her commercial pharmacy. Please advise

## 2024-04-10 NOTE — Patient Instructions (Addendum)
 Flu shot today  Ok to get COVID vaccine in a few days.  MS Contin  and lunesta  refilled today.  Let us  know if sinus symptoms aren't continue to improve each day, or if any recurrence of sinusitis.  Good to see you today  Return as needed or in 3 months for chronic pain follow up visit

## 2024-04-10 NOTE — Telephone Encounter (Signed)
 Sent to correct pharmacy

## 2024-04-10 NOTE — Progress Notes (Signed)
 Ph: [phone] Fax: [phone]   Patient ID: Rachel Duran, female    DOB: [birth year], 68 y.o.   MRN: [id number]  This visit was conducted in person.  BP 118/80   Pulse 96   Temp 98.8 F (37.1 C) (Oral)   Ht 5' 2.5 (1.588 m)   Wt 123 lb 6 oz (56 kg)   LMP 07/25/1998   SpO2 99%   BMI 22.21 kg/m    CC: 3 mo f/u visit  Subjective:   HPI: Rachel Duran is a 68 y.o. female presenting on 04/10/2024 for Medical Management of Chronic Issues (PT here for 46mo f/u )   OP - recently started Evenity  through GYN, first dose 04/05/2024. Plan fo r1 year then change to Prolia.   Limited systemic sclerosis, OA, raynaud's disease and FM - followed by rheumatology. 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 She did undergo ERCP and cholecystectomy after this as well. Saw ID Dr Luiz 03/2013 - plan at that time was 2-4 months of rifampin  however was also unable to tolerate this treatment. 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   Chronic pain on morphine  (see below), lyrica  150mg  BID, lidocaine  patches 1 patch nightly. H/o multiple spine surgeries with residual pain, latest displaced clavicle followed by cervical C5/6 fracture subluxation s/p repair after fall. H/o prior cervical spine fusion.   Was on MSIR 15mg  bid however due to nationwide shortage this was changed to MSContin 30mg  once daily. She is doing well with this. She notes she did better with the MSIR.   Trouble with other opiates previously tried - demerol (rash), codeine /hydrocodone /oxycodone  (nausea/vomiting), nucynta  (leg jerking) and fentanyl  (cold, insomnia, fatigue).   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 Receiving bilateral eye  injections q8 wks through Dr Jarold.   Planning to get COVID vaccine updated at local pharmacy  Upcoming PFTs and echocardiogram.   10 days of allergic rhinitis/ sinus symptoms with rhinorrhea, hoarseness, L earache, nasal congestion, dry nose feeling managing with flonase , claritin and nasal saline - this seems to be improving.      Relevant past medical, surgical, family and social history reviewed and updated as indicated. Interim medical history since our last visit reviewed. Allergies and medications reviewed and updated. Outpatient Medications Prior to Visit  Medication Sig Dispense Refill   ALPRAZolam  (XANAX ) 1 MG tablet Take 0.5 tablets (0.5 mg total) by mouth at bedtime. 30 tablet 0   amLODipine  (NORVASC ) 2.5 MG tablet TAKE 1 TABLET BY MOUTH ONCE DAILY 90 tablet 0   amLODipine  (NORVASC ) 5 MG tablet Take 1 tablet (5 mg total) by mouth daily. 90 tablet 0   amphetamine -dextroamphetamine  (ADDERALL  XR) 30 MG 24 hr capsule TAKE 1 CAPSULE BY MOUTH EVERY MORNING 90 capsule 0   Calcium Carbonate-Vitamin D  (CALTRATE 600+D PO) Take 2 tablets by mouth daily.     clindamycin  (CLEOCIN ) 150 MG capsule Take 600mg  1 hour prior to dental procedure 10 capsule 0   cyanocobalamin  (VITAMIN B12) 1000 MCG/ML injection INJECT 1ML INTO THE MUSCLE EVERY 21 DAYSAS DIRECTED 4 mL 3   desonide (DESOWEN) 0.05 % ointment Apply topically.     ferrous sulfate  325 (65 FE) MG tablet Take 1 tablet (325 mg total)  by mouth 2 (two) times a week.     fluticasone  (FLONASE ) 50 MCG/ACT nasal spray Place 2 sprays into both nostrils daily. 16 g 6   lamoTRIgine  (LAMICTAL ) 25 MG tablet Take 50 mg by mouth daily.     lidocaine  (LIDODERM ) 5 % Place 3 patches onto the skin daily. Remove & Discard patch within 12 hours or as directed by MD 60 patch 0   lidocaine  (XYLOCAINE ) 5 % ointment Apply 1 Application topically as needed. Apply up to 4x/day- on LUE for nerve pain 50 g 6   linaclotide  (LINZESS ) 290 MCG CAPS capsule Take 1  capsule (290 mcg total) by mouth daily. 90 capsule 2   morphine  (MSIR) 15 MG tablet TAKE ONE TABLET BY MOUTH EVERY 6 HOURS AS NEEDED FOR SEVERE PAIN 90 tablet 0   naloxegol  oxalate (MOVANTIK ) 25 MG TABS tablet Take 1 tablet (25 mg total) by mouth daily. 90 tablet 3   NONFORMULARY OR COMPOUNDED ITEM Testosterone  proprionate petrolatum (jar) 2% ointment  2 Grams S: Apply a small amount once daily x 5 days per week.  Disp:  30 grams, RF:  none. [facility]. 30 each 0   ondansetron  (ZOFRAN ) 4 MG tablet TAKE 1 TABLET BY MOUTH EVERY 8 HOURS AS NEEDED FOR NAUSEA AND VOMITING. 30 tablet 0   pantoprazole  (PROTONIX ) 40 MG tablet TAKE ONE TABLET BY MOUTH TWICE DAILY WITH MEALS 180 tablet 3   phenazopyridine  (PYRIDIUM ) 100 MG tablet Take 200 mg by mouth 3 (three) times daily as needed.     polyethylene glycol (MIRALAX  / GLYCOLAX ) 17 g packet Take 17 g by mouth 2 (two) times daily.     Prasterone  (INTRAROSA ) 6.5 MG INST UNWRAP AND INSERT 1 SUPPOSITORY  VAGINALLY WITH PROVIDED  APPLICATOR AT BEDTIME 90 each 3   pregabalin  (LYRICA ) 75 MG capsule Take 1 capsule (75 mg total) by mouth 3 (three) times daily. 90 capsule 0   tobramycin (TOBREX) 0.3 % ophthalmic solution SMARTSIG:In Eye(s)     ursodiol  (ACTIGALL ) 300 MG capsule Take 1 capsule (300 mg total) by mouth 2 (two) times daily. 180 capsule 3   Eszopiclone  3 MG TABS Take 1 tablet (3 mg total) by mouth at bedtime. Take immediately before bedtime 90 tablet 0   morphine  (MS CONTIN ) 30 MG 12 hr tablet Take 1 tablet (30 mg total) by mouth daily. 30 tablet 0   morphine  (MSIR) 30 MG tablet      morphine  (MS CONTIN ) 30 MG 12 hr tablet Take 1 tablet (30 mg total) by mouth daily. 30 tablet 0   Facility-Administered Medications Prior to Visit  Medication Dose Route Frequency Provider Last Rate Last Admin   [START ON 05/05/2024] Romosozumab -aqqg (EVENITY ) 105 MG/1. injection 210 mg  210 mg Subcutaneous Q30 days Cathlyn JAYSON Nikki Bobie FORBES, MD         Per  HPI unless specifically indicated in ROS section below Review of Systems  Objective:  BP 118/80   Pulse 96   Temp 98.8 F (37.1 C) (Oral)   Ht 5' 2.5 (1.588 m)   Wt 123 lb 6 oz (56 kg)   LMP 07/25/1998   SpO2 99%   BMI 22.21 kg/m   Wt Readings from Last 3 Encounters:  04/10/24 123 lb 6 oz (56 kg)  03/11/24 126 lb (57.2 kg)  02/19/24 121 lb (54.9 kg)      Physical Exam Vitals and nursing note reviewed.  Constitutional:      Appearance: Normal appearance. She  is not ill-appearing.  HENT:     Head: Normocephalic and atraumatic.     Right Ear: Tympanic membrane, ear canal and external ear normal. There is no impacted cerumen.     Left Ear: Tympanic membrane, ear canal and external ear normal. There is no impacted cerumen.     Nose: Mucosal edema (mild) present. No congestion or rhinorrhea.     Right Turbinates: Not enlarged, swollen or pale.     Left Turbinates: Not enlarged, swollen or pale.     Right Sinus: Maxillary sinus tenderness present. No frontal sinus tenderness.     Left Sinus: Maxillary sinus tenderness present. No frontal sinus tenderness.     Mouth/Throat:     Mouth: Mucous membranes are moist.     Pharynx: Oropharynx is clear. No oropharyngeal exudate or posterior oropharyngeal erythema.  Eyes:     Extraocular Movements: Extraocular movements intact.     Conjunctiva/sclera: Conjunctivae normal.     Pupils: Pupils are equal, round, and reactive to light.  Cardiovascular:     Rate and Rhythm: Normal rate and regular rhythm.     Pulses: Normal pulses.     Heart sounds: Normal heart sounds. No murmur heard. Pulmonary:     Effort: Pulmonary effort is normal. No respiratory distress.     Breath sounds: Normal breath sounds. No wheezing, rhonchi or rales.  Lymphadenopathy:     Head:     Right side of head: No submental, submandibular, tonsillar, preauricular or posterior auricular adenopathy.     Left side of head: No submental, submandibular, tonsillar,  preauricular or posterior auricular adenopathy.     Cervical: No cervical adenopathy.     Right cervical: No superficial cervical adenopathy.    Left cervical: No superficial cervical adenopathy.     Upper Body:     Right upper body: No supraclavicular adenopathy.     Left upper body: No supraclavicular adenopathy.  Skin:    Findings: No rash.  Neurological:     Mental Status: She is alert.  Psychiatric:        Mood and Affect: Mood normal.        Behavior: Behavior normal.        Assessment & Plan:   Problem List Items Addressed This Visit     Encounter for chronic pain management - Primary (Chronic)   [place] CSRS reviewed. [facility] Consider updated UDS next visit      Insomnia   Stable period on max dose Lunesta  3mg  nightly - refilled.  Tried and failed several other meds as per prob list overview.       [name] syndrome   Allergic rhinitis   Anticipate current symptoms are due to exacerbation of fall allergies vs viral sinusitis given symptoms are improving over 10 days and with commencement of regular allergy regimen including antihistamine, flonase , nasal saline. Recommend update if any prolonged or worsening symptoms to consider abx course. Reviewed allergen avoidance measures.       Cervical disc disorder with radiculopathy of cervical region   Fibromyalgia   Chronic pain syndrome   Stargardt's disease   Regularly sees Dr Jarold Retina specialist receiving bilateral eye injections Q8 wks.      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HCC)   Continue regular rheumatology f/u.      Osteoporosis   Tolerated recent 1st Evenity  injection well.       Cervical spine arthritis with nerve pain   Other Visit Diagnoses  Encounter for immunization       Relevant Orders   Flu vaccine HIGH DOSE PF(Fluzone Trivalent) (Completed)        No orders of the defined types were placed in this encounter.   Orders Placed This Encounter  Procedures   Flu  vaccine HIGH DOSE PF(Fluzone Trivalent)    Patient Instructions  Flu shot today  Ok to get COVID vaccine in a few days.  MS Contin  and lunesta  refilled today.  Let us  know if sinus symptoms aren't continue to improve each day, or if any recurrence of sinusitis.  Good to see you today  Return as needed or in 3 months for chronic pain follow up visit    Follow up plan: Return in about 3 months (around 07/10/2024) for follow up visit.  Anton Blas, MD

## 2024-04-10 NOTE — Assessment & Plan Note (Signed)
 Anticipate current symptoms are due to exacerbation of fall allergies vs viral sinusitis given symptoms are improving over 10 days and with commencement of regular allergy regimen including antihistamine, flonase , nasal saline. Recommend update if any prolonged or worsening symptoms to consider abx course. Reviewed allergen avoidance measures.

## 2024-04-10 NOTE — Assessment & Plan Note (Signed)
 Stable period on max dose Lunesta  3mg  nightly - refilled.  Tried and failed several other meds as per prob list overview.

## 2024-04-12 ENCOUNTER — Ambulatory Visit: Admitting: Family Medicine

## 2024-04-15 ENCOUNTER — Encounter: Payer: Self-pay | Admitting: Pulmonary Disease

## 2024-04-15 ENCOUNTER — Ambulatory Visit: Admitting: Pulmonary Disease

## 2024-04-15 VITALS — BP 142/82 | HR 74 | Temp 98.3°F | Ht 63.0 in | Wt 125.0 lb

## 2024-04-15 DIAGNOSIS — M349 Systemic sclerosis, unspecified: Secondary | ICD-10-CM

## 2024-04-15 DIAGNOSIS — R911 Solitary pulmonary nodule: Secondary | ICD-10-CM

## 2024-04-15 NOTE — Progress Notes (Signed)
 Velia Pamer    [id number]    04/23/1956  Primary Care Physician:Gutierrez, Anton, MD  Referring Physician: Rilla Anton, MD [address] [place],  [place] [address]  Chief complaint: Follow-up for ILD evaluation  HPI: 68 y.o.  with history of [name], fibromyalgia with recent diagnosis of scleroderma Referred for evaluation of interstitial lung disease.  She has a strong family history of scleroderma and longstanding [name] phenomena.  More recently she has developed skin thickening over the digits and findings of  capillary dilation and dropout on dermatology evaluation supporting a diagnosis of systemic sclerosis.  She has history of dysphagia and follows with Dr. Aneita, gastroenterology.  Underwent esophageal dilatation around 2018.  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  She has also been referred to Dr. Bensimhon for evaluation of pulmonary hypertension She was evaluated by Dr. Cherrie with no evidence of pulmonary hypertension on echocardiogram. Had cervical fusion in June 2023  Interim history: Discussed the use of AI scribe software for clinical note transcription with the patient, who gave verbal consent to proceed.  History of Present Illness The patient, with systemic sclerosis and [name] phenomenon, presents for follow-up.  Dyspnea and exercise intolerance - Mild exertional dyspnea, particularly when climbing stairs - Sensation of being slightly winded with exertion - Reduced ability to take in air while swimming  Pulmonary imaging and structural abnormalities - CT scan earlier this year showed bronchiectasis and a small lung nodule - No evidence of interstitial lung disease on CT scan  History of pulmonary infection - Pneumonia in April, discovered incidentally after a fall - Full recovery since pneumonia  Musculoskeletal pain and deformity - Moderate scoliosis  with thoracic pain localized around T3-T5  Cardiac evaluation - Echocardiogram scheduled for December   Relevant pulmonary history Pets: Dogs Occupation: Used to work as a Production designer, 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  No mold, hot tub, Jacuzzi Smoking history: Never smoker Travel history: Originally from [place] .  No significant recent travel Relevant family history: Mother had scleroderma with interstitial lung disease and pulmonary hypertension.  Outpatient Encounter Medications as of 04/15/2024  Medication Sig   ALPRAZolam  (XANAX ) 1 MG tablet Take 0.5 tablets (0.5 mg total) by mouth at bedtime.   amLODipine  (NORVASC ) 2.5 MG tablet TAKE 1 TABLET BY MOUTH ONCE DAILY   amLODipine  (NORVASC ) 5 MG tablet Take 1 tablet (5 mg total) by mouth daily.   amphetamine -dextroamphetamine  (ADDERALL  XR) 30 MG 24 hr capsule TAKE 1 CAPSULE BY MOUTH EVERY MORNING   Calcium Carbonate-Vitamin D  (CALTRATE 600+D PO) Take 2 tablets by mouth daily.   clindamycin  (CLEOCIN ) 150 MG capsule Take 600mg  1 hour prior to dental procedure   cyanocobalamin  (VITAMIN B12) 1000 MCG/ML injection INJECT 1ML INTO THE MUSCLE EVERY 21 DAYSAS DIRECTED   Eszopiclone  3 MG TABS Take 1 tablet (3 mg total) by mouth at bedtime. Take immediately before bedtime   ferrous sulfate  325 (65 FE) MG tablet Take 1 tablet (325 mg total) by mouth 2 (two) times a week.   fluticasone  (FLONASE ) 50 MCG/ACT nasal spray Place 2 sprays into both nostrils daily.   lamoTRIgine  (LAMICTAL ) 25 MG tablet Take 50 mg by mouth daily.   lidocaine  (LIDODERM ) 5 % Place 3 patches onto the skin daily. Remove & Discard patch within 12 hours or as directed by MD   lidocaine  (XYLOCAINE ) 5 % ointment Apply 1  Application topically as needed. Apply up to 4x/day- on LUE for nerve pain   linaclotide  (LINZESS ) 290 MCG CAPS capsule Take 1 capsule (290 mcg total) by mouth daily.   morphine  (MS CONTIN ) 30 MG 12 hr tablet Take 1 tablet (30 mg  total) by mouth daily.   naloxegol  oxalate (MOVANTIK ) 25 MG TABS tablet Take 1 tablet (25 mg total) by mouth daily.   NONFORMULARY OR COMPOUNDED ITEM Testosterone  proprionate petrolatum (jar) 2% ointment  2 Grams S: Apply a small amount once daily x 5 days per week.  Disp:  30 grams, RF:  none. [facility].   ondansetron  (ZOFRAN ) 4 MG tablet TAKE 1 TABLET BY MOUTH EVERY 8 HOURS AS NEEDED FOR NAUSEA AND VOMITING.   pantoprazole  (PROTONIX ) 40 MG tablet TAKE ONE TABLET BY MOUTH TWICE DAILY WITH MEALS   phenazopyridine  (PYRIDIUM ) 100 MG tablet Take 200 mg by mouth 3 (three) times daily as needed.   polyethylene glycol (MIRALAX  / GLYCOLAX ) 17 g packet Take 17 g by mouth 2 (two) times daily.   Prasterone  (INTRAROSA ) 6.5 MG INST UNWRAP AND INSERT 1 SUPPOSITORY  VAGINALLY WITH PROVIDED  APPLICATOR AT BEDTIME   pregabalin  (LYRICA ) 75 MG capsule Take 1 capsule (75 mg total) by mouth 3 (three) times daily.   tobramycin (TOBREX) 0.3 % ophthalmic solution SMARTSIG:In Eye(s)   ursodiol  (ACTIGALL ) 300 MG capsule Take 1 capsule (300 mg total) by mouth 2 (two) times daily.   desonide (DESOWEN) 0.05 % ointment Apply topically. (Patient not taking: Reported on 04/15/2024)   morphine  (MSIR) 15 MG tablet TAKE ONE TABLET BY MOUTH EVERY 6 HOURS AS NEEDED FOR SEVERE PAIN (Patient not taking: Reported on 04/15/2024)   Facility-Administered Encounter Medications as of 04/15/2024  Medication   [START ON 05/05/2024] Romosozumab -aqqg (EVENITY ) 105 MG/1. injection 210 mg   Vitals:   04/15/24 1013  BP: (!) 142/82  Pulse: 74  Temp: 98.3 F (36.8 C)  Height: 5' 3 (1.6 m)  Weight: 125 lb (56.7 kg)  SpO2: 99%  TempSrc: Oral  BMI (Calculated): 22.15     Physical Exam GEN: No acute distress CV: Regular rate and rhythm no murmurs LUNGS: Clear to auscultation bilaterally normal respiratory effort SKIN JOINTS: Warm and dry no rash    Data Reviewed: Imaging: Chest x-ray 10/18/2016-no acute cardiopulmonary  abnormality.   High-resolution CT 05/08/2020-no evidence of fibrotic interstitial lung disease, mild tubular bronchiectasis, air trapping, 6 mm nodule in the right pulmonary apex.  Aortic atherosclerosis. CT chest without contrast 05/01/21- no evidence of ILD, pulmonary nodule stable CT Cardiac 09/17/21-visualized lung images do not show any abnormalities. High resolution CT 11/09/2022-stable right upper lobe pulmonary nodule, aortic, coronary atherosclerosis.  No evidence of interstitial lung disease High-resolution CT 10/23/2023-no evidence of ILD, minimal air trapping, cylindrical bronchiectasis. I have reviewed the images personally.  PFTs: 07/07/2020 FVC 3.18 [96%], FEV1 2.63 [103%], F/F 83, TLC 5.20 [101%], DLCO 17.43 [85%] Normal test  04/15/2024 FVC 3.25 [103%], FEV1 2.50 [104%], F/F77, TLC 3.96 [78%], DLCO 18.82 [95%] Minimal restriction  Cardiac: Echocardiogram 06/29/2022 LVEF 55-60%, grade 1 diastolic dysfunction.  Normal RV systolic size and function, TAPSE 1.7 cm Assessment & Plan Systemic sclerosis (limited cutaneous) with [name] phenomenon Systemic sclerosis with [name] phenomenon is well-managed with Norvasc . No evidence of interstitial lung disease on recent CT scan. Lung function test shows borderline change in lung capacity, slightly below normal range, but not concerning. No new symptoms reported. - Continue Norvasc  for [name] phenomenon - Repeat CT scan in April  2026 to monitor for interstitial lung disease - Reassess lung function and symptoms after CT scan - Echocardiogram scheduled for December 2025  Bronchiectasis Mild bronchiectasis with no acute symptoms or exacerbations. Condition is being monitored. - Reassess with CT scan in April 2026  Solitary pulmonary nodule, likely benign Small solitary pulmonary nodule identified, likely benign due to lack of smoking history. No changes noted on recent imaging. - Monitor with repeat CT scan in April  2026  Moderate thoracic scoliosis Moderate thoracic scoliosis present since adolescence, contributing to slight reduction in lung capacity. Reports increased pain in thoracic region, particularly at T3-T5 levels. - Encourage swimming and warm water  exercises to alleviate symptoms - Suggest exploring local facilities with heated pools for exercise   Plan/Recommendations: High resolution CT in April 2025  Lonna Coder MD [facility] 04/15/2024, 10:27 AM  CC: Rilla Baller, MD

## 2024-04-15 NOTE — Patient Instructions (Addendum)
 VISIT SUMMARY:  You came in for a follow-up visit to discuss your systemic sclerosis, [name] phenomenon, and other related health issues. We reviewed your current symptoms, recent imaging results, and discussed your treatment plan moving forward.  YOUR PLAN:  SYSTEMIC SCLEROSIS WITH [name] PHENOMENON: Your systemic sclerosis and [name] phenomenon are well-managed with your current medication, Norvasc . There are no new symptoms, and your recent CT scan showed no evidence of interstitial lung disease. -Continue taking Norvasc  as prescribed. -We will repeat your CT scan in April 2026 to monitor for interstitial lung disease. -We will reassess your lung function and symptoms after the CT scan.  BRONCHIECTASIS: You have mild bronchiectasis with no acute symptoms or exacerbations at this time. -We will reassess your condition with a CT scan in April 2026.  SOLITARY PULMONARY NODULE: A small solitary pulmonary nodule was identified, which is likely benign given your lack of smoking history and no changes on recent imaging. -We will monitor the nodule with a repeat CT scan in April 2026.  MODERATE THORACIC SCOLIOSIS: You have moderate thoracic scoliosis, which has been present since adolescence and contributes to a slight reduction in lung capacity. You reported increased pain in the thoracic region, particularly at the T3-T5 levels. -Engage in swimming and warm water  exercises to help alleviate symptoms. -Consider exploring local facilities with heated pools for exercise.

## 2024-04-15 NOTE — Patient Instructions (Signed)
 Full PFT performed today.

## 2024-04-15 NOTE — Progress Notes (Signed)
 Full PFT performed today.

## 2024-04-16 LAB — PULMONARY FUNCTION TEST
DL/VA % pred: 118 %
DL/VA: 4.95 ml/min/mmHg/L
DLCO cor % pred: 99 %
DLCO cor: 19.59 ml/min/mmHg
DLCO unc % pred: 95 %
DLCO unc: 18.82 ml/min/mmHg
FEF 25-75 Post: 2.14 L/s
FEF 25-75 Pre: 2.36 L/s
FEF2575-%Change-Post: -9 %
FEF2575-%Pred-Post: 102 %
FEF2575-%Pred-Pre: 113 %
FEV1-%Change-Post: -2 %
FEV1-%Pred-Post: 104 %
FEV1-%Pred-Pre: 107 %
FEV1-Post: 2.5 L
FEV1-Pre: 2.57 L
FEV1FVC-%Change-Post: -2 %
FEV1FVC-%Pred-Pre: 103 %
FEV6-%Change-Post: 0 %
FEV6-%Pred-Post: 107 %
FEV6-%Pred-Pre: 107 %
FEV6-Post: 3.22 L
FEV6-Pre: 3.22 L
FEV6FVC-%Change-Post: 0 %
FEV6FVC-%Pred-Post: 103 %
FEV6FVC-%Pred-Pre: 103 %
FVC-%Change-Post: 0 %
FVC-%Pred-Post: 103 %
FVC-%Pred-Pre: 103 %
FVC-Post: 3.25 L
Post FEV1/FVC ratio: 77 %
Post FEV6/FVC ratio: 99 %
Pre FEV1/FVC ratio: 79 %
Pre FEV6/FVC Ratio: 99 %
RV % pred: 40 %
RV: 0.84 L
TLC % pred: 78 %
TLC: 3.96 L

## 2024-04-29 ENCOUNTER — Other Ambulatory Visit: Payer: Self-pay | Admitting: Rheumatology

## 2024-04-30 ENCOUNTER — Encounter: Payer: Self-pay | Admitting: Family Medicine

## 2024-04-30 ENCOUNTER — Ambulatory Visit (INDEPENDENT_AMBULATORY_CARE_PROVIDER_SITE_OTHER): Admitting: Family Medicine

## 2024-04-30 VITALS — BP 118/78 | HR 92 | Temp 98.7°F | Ht 63.0 in | Wt 125.4 lb

## 2024-04-30 DIAGNOSIS — J019 Acute sinusitis, unspecified: Secondary | ICD-10-CM | POA: Diagnosis not present

## 2024-04-30 DIAGNOSIS — H9202 Otalgia, left ear: Secondary | ICD-10-CM | POA: Diagnosis not present

## 2024-04-30 DIAGNOSIS — G894 Chronic pain syndrome: Secondary | ICD-10-CM

## 2024-04-30 DIAGNOSIS — J029 Acute pharyngitis, unspecified: Secondary | ICD-10-CM

## 2024-04-30 LAB — POCT RAPID STREP A (OFFICE): Rapid Strep A Screen: NEGATIVE

## 2024-04-30 MED ORDER — MORPHINE SULFATE 15 MG PO TABS: ORAL_TABLET | ORAL | 0 refills | Status: DC

## 2024-04-30 MED ORDER — AMOXICILLIN-POT CLAVULANATE 875-125 MG PO TABS: 1.0000 | ORAL_TABLET | Freq: Two times a day (BID) | ORAL | 0 refills | Status: AC

## 2024-04-30 NOTE — Patient Instructions (Signed)
 VISIT SUMMARY: Today, you were seen for significant left ear pain and a sore throat that you have been experiencing for five days. The pain has been severe enough to disrupt your sleep and radiates from your left ear to the left side of your throat. You have been using saltwater rinses, hot tea with honey, and Tylenol  for relief. You also resumed Claritin and Flonase  a few weeks ago due to ragweed allergies. There is no sinus drainage, sneezing, coughing, or congestion, and your hearing remains unchanged. However, you feel a sensation of something deep in your ear and have tenderness in the glands on the left side.  YOUR PLAN: -ACUTE LEFT-SIDED SINUSITIS: This is a bacterial infection of the sinus cavities on the left side of your face, causing pain that can radiate to your ear and throat. You have been prescribed Augmentin  for 10 days to treat the infection. Please increase your fluid intake, get plenty of rest, and continue using your allergy medications, nasal saline, and salt water  gargles. Report back if your symptoms do not improve.  -ALLERGIC RHINITIS: This is an inflammation of the inside of your nose caused by allergies, which has been exacerbated by ragweed. Continue taking Claritin and using Flonase  as you have up to now.  INSTRUCTIONS: Please take Augmentin  as prescribed for 10 days. Continue using Claritin and Flonase  for your allergies, and use nasal saline and salt water  gargles for relief. Increase your fluid intake and get plenty of rest. If your symptoms do not improve, please report back to us .

## 2024-04-30 NOTE — Assessment & Plan Note (Signed)
 RST negative Reassuringly normal ear exam. Anticipate left sided sinusitis, present for several weeks but acutely worse over the past 5 days.  Treat for bacterial sinusitis given duration and progression of symptoms. Continue flonase , antihistamine. Further supportive measures as per instructions.  Update if not improving with treatment.  Pt agrees with plan.

## 2024-04-30 NOTE — Addendum Note (Signed)
 Addended by: DELORES, Laritza Vokes L on: 04/30/2024 03:17 PM   Modules accepted: Orders

## 2024-04-30 NOTE — Assessment & Plan Note (Addendum)
 Requests return to [facility] which is now available. This was previously more effective than her MSContin. MSIR prescribed TID PRN, averages BID. Refilled #90 to local [facility].

## 2024-04-30 NOTE — Progress Notes (Signed)
 Ph: [phone] Fax: [phone]   Patient ID: Rachel Duran, female    DOB: May 19, 1956, 68 y.o.   MRN: [id number]  This visit was conducted in person.  BP 118/78   Pulse 92   Temp 98.7 F (37.1 C) (Oral)   Ht 5' 3 (1.6 m)   Wt 125 lb 6 oz (56.9 kg)   LMP 07/25/1998   SpO2 97%   BMI 22.21 kg/m    Chief Complaint  Patient presents with   Ear Pain    Pt here for Left ear pain. PT states her Left side of throat started hurting on 04/26/24 Pain Scale for throat is a 7  Pt states she has been gargling salt water  for her sore throat    Subjective:   Discussed the use of AI scribe software for clinical note transcription with the patient, who gave verbal consent to proceed.  History of Present Illness   Rachel Duran is a 68 year old female who presents with left ear pain and sore throat.  She has experienced significant left ear pain and a sore throat for five days, severe enough to disrupt sleep. The pain radiates from the left ear to the left side of her throat. She uses saltwater rinses, hot tea with honey, and Tylenol  for relief. She resumed Claritin and Flonase  a few weeks ago due to ragweed allergies.  There is no sinus drainage, sneezing, coughing, or congestion. Hearing remains unchanged, but there is a sensation of something deep in the ear. Tenderness is present in the glands on the left side, though they are not currently swollen. There is no ear drainage, vertigo, dizziness, fever, or tooth pain.      Chronic pain management - requests return to MS immediate release which is now available - this was working better than MS Contin .      Relevant past medical, surgical, family and social history reviewed and updated as indicated. Interim medical history since our last visit reviewed. Allergies and medications reviewed and updated. Outpatient Medications Prior to Visit  Medication Sig Dispense Refill   ALPRAZolam  (XANAX ) 1 MG tablet Take 0.5  tablets (0.5 mg total) by mouth at bedtime. 30 tablet 0   amLODipine  (NORVASC ) 2.5 MG tablet TAKE 1 TABLET BY MOUTH ONCE DAILY 90 tablet 0   amLODipine  (NORVASC ) 5 MG tablet Take 1 tablet (5 mg total) by mouth daily. 90 tablet 0   amphetamine -dextroamphetamine  (ADDERALL  XR) 30 MG 24 hr capsule TAKE 1 CAPSULE BY MOUTH EVERY MORNING 90 capsule 0   ARIPiprazole  (ABILIFY ) 5 MG tablet Take 5 mg by mouth daily.     Calcium Carbonate-Vitamin D  (CALTRATE 600+D PO) Take 2 tablets by mouth daily.     clindamycin  (CLEOCIN ) 150 MG capsule Take 600mg  1 hour prior to dental procedure 10 capsule 0   cyanocobalamin  (VITAMIN B12) 1000 MCG/ML injection INJECT 1ML INTO THE MUSCLE EVERY 21 DAYSAS DIRECTED 4 mL 3   desonide (DESOWEN) 0.05 % ointment Apply topically.     Eszopiclone  3 MG TABS Take 1 tablet (3 mg total) by mouth at bedtime. Take immediately before bedtime 90 tablet 0   ferrous sulfate  325 (65 FE) MG tablet Take 1 tablet (325 mg total) by mouth 2 (two) times a week.     fluticasone  (FLONASE ) 50 MCG/ACT nasal spray Place 2 sprays into both nostrils daily. 16 g 6   lamoTRIgine  (LAMICTAL ) 25 MG tablet Take 50 mg by mouth daily.     lidocaine  (  LIDODERM ) 5 % Place 3 patches onto the skin daily. Remove & Discard patch within 12 hours or as directed by MD 60 patch 0   lidocaine  (XYLOCAINE ) 5 % ointment Apply 1 Application topically as needed. Apply up to 4x/day- on LUE for nerve pain 50 g 6   linaclotide  (LINZESS ) 290 MCG CAPS capsule Take 1 capsule (290 mcg total) by mouth daily. 90 capsule 2   naloxegol  oxalate (MOVANTIK ) 25 MG TABS tablet Take 1 tablet (25 mg total) by mouth daily. 90 tablet 3   NONFORMULARY OR COMPOUNDED ITEM Testosterone  proprionate petrolatum (jar) 2% ointment  2 Grams S: Apply a small amount once daily x 5 days per week.  Disp:  30 grams, RF:  none. [facility]. 30 each 0   ondansetron  (ZOFRAN ) 4 MG tablet TAKE 1 TABLET BY MOUTH EVERY 8 HOURS AS NEEDED FOR NAUSEA AND VOMITING.  30 tablet 0   pantoprazole  (PROTONIX ) 40 MG tablet TAKE ONE TABLET BY MOUTH TWICE DAILY WITH MEALS 180 tablet 3   phenazopyridine  (PYRIDIUM ) 100 MG tablet Take 200 mg by mouth 3 (three) times daily as needed.     polyethylene glycol (MIRALAX  / GLYCOLAX ) 17 g packet Take 17 g by mouth 2 (two) times daily.     Prasterone  (INTRAROSA ) 6.5 MG INST UNWRAP AND INSERT 1 SUPPOSITORY  VAGINALLY WITH PROVIDED  APPLICATOR AT BEDTIME 90 each 3   pregabalin  (LYRICA ) 75 MG capsule Take 1 capsule (75 mg total) by mouth 3 (three) times daily. 90 capsule 0   tobramycin (TOBREX) 0.3 % ophthalmic solution SMARTSIG:In Eye(s)     ursodiol  (ACTIGALL ) 300 MG capsule Take 1 capsule (300 mg total) by mouth 2 (two) times daily. 180 capsule 3   morphine  (MS CONTIN ) 30 MG 12 hr tablet Take 1 tablet (30 mg total) by mouth daily. 30 tablet 0   morphine  (MSIR) 15 MG tablet TAKE ONE TABLET BY MOUTH EVERY 6 HOURS AS NEEDED FOR SEVERE PAIN 90 tablet 0   Facility-Administered Medications Prior to Visit  Medication Dose Route Frequency Provider Last Rate Last Admin   [START ON 05/05/2024] Romosozumab -aqqg (EVENITY ) 105 MG/1. injection 210 mg  210 mg Subcutaneous Q30 days Cathlyn JAYSON Nikki Bobie FORBES, MD         Per HPI unless specifically indicated in ROS section below Review of Systems  Objective:  BP 118/78   Pulse 92   Temp 98.7 F (37.1 C) (Oral)   Ht 5' 3 (1.6 m)   Wt 125 lb 6 oz (56.9 kg)   LMP 07/25/1998   SpO2 97%   BMI 22.21 kg/m   Wt Readings from Last 3 Encounters:  04/30/24 125 lb 6 oz (56.9 kg)  04/15/24 125 lb (56.7 kg)  04/10/24 123 lb 6 oz (56 kg)        Physical Exam Vitals and nursing note reviewed.  Constitutional:      Appearance: Normal appearance. She is not ill-appearing.  HENT:     Head: Normocephalic and atraumatic.     Right Ear: Tympanic membrane, ear canal and external ear normal. There is no impacted cerumen.     Left Ear: Tympanic membrane, ear canal and external ear normal.  There is no impacted cerumen.     Ears:     Comments: TMs pearly grey bilaterally     Nose: Nasal tenderness present. No congestion or rhinorrhea.     Right Turbinates: Pale. Not enlarged.     Left Turbinates: Pale. Not enlarged.  Right Sinus: No maxillary sinus tenderness or frontal sinus tenderness.     Left Sinus: Maxillary sinus tenderness present. No frontal sinus tenderness.     Comments: Possible small nasal polyps bilaterally    Mouth/Throat:     Mouth: Mucous membranes are moist.     Pharynx: Oropharynx is clear. Posterior oropharyngeal erythema (to left posterior oropharynx) present. No oropharyngeal exudate.     Tonsils: No tonsillar exudate.  Eyes:     Extraocular Movements: Extraocular movements intact.     Conjunctiva/sclera: Conjunctivae normal.     Pupils: Pupils are equal, round, and reactive to light.  Cardiovascular:     Rate and Rhythm: Normal rate and regular rhythm.     Pulses: Normal pulses.     Heart sounds: Normal heart sounds. No murmur heard. Pulmonary:     Effort: Pulmonary effort is normal. No respiratory distress.     Breath sounds: Normal breath sounds. No wheezing, rhonchi or rales.  Lymphadenopathy:     Head:     Right side of head: No submental, submandibular, tonsillar, preauricular or posterior auricular adenopathy.     Left side of head: No submental, submandibular, tonsillar, preauricular or posterior auricular adenopathy.     Cervical: No cervical adenopathy.     Right cervical: No superficial cervical adenopathy.    Left cervical: No superficial cervical adenopathy.     Upper Body:     Right upper body: No supraclavicular adenopathy.     Left upper body: No supraclavicular adenopathy.  Skin:    Findings: No rash.  Neurological:     Mental Status: She is alert.  Psychiatric:        Mood and Affect: Mood normal.        Behavior: Behavior normal.       Results   Pulmonary Function Tests: Reduced lung capacity, slightly below normal  compared to previous assessment.       Results for orders placed or performed in visit on 04/15/24  Pulmonary Function Test   Collection Time: 04/15/24  9:36 AM  Result Value Ref Range   FVC-%Pred-Pre 103 %   FVC-Post 3.25 L   FVC-%Pred-Post 103 %   FVC-%Change-Post 0 %   FEV1-Pre 2.57 L   FEV1-%Pred-Pre 107 %   FEV1-Post 2.50 L   FEV1-%Pred-Post 104 %   FEV1-%Change-Post -2 %   FEV6-Pre 3.22 L   FEV6-%Pred-Pre 107 %   FEV6-Post 3.22 L   FEV6-%Pred-Post 107 %   FEV6-%Change-Post 0 %   Pre FEV1/FVC ratio 79 %   FEV1FVC-%Pred-Pre 103 %   Post FEV1/FVC ratio 77 %   FEV1FVC-%Change-Post -2 %   Pre FEV6/FVC Ratio 99 %   FEV6FVC-%Pred-Pre 103 %   Post FEV6/FVC ratio 99 %   FEV6FVC-%Pred-Post 103 %   FEV6FVC-%Change-Post 0 %   FEF 25-75 Pre 2.36 L/sec   FEF2575-%Pred-Pre 113 %   FEF 25-75 Post 2.14 L/sec   FEF2575-%Pred-Post 102 %   FEF2575-%Change-Post -9 %   RV 0.84 L   RV % pred 40 %   TLC 3.96 L   TLC % pred 78 %   DLCO unc 18.82 ml/min/mmHg   DLCO unc % pred 95 %   DLCO cor 19.59 ml/min/mmHg   DLCO cor % pred 99 %   DL/VA 5.04 ml/min/mmHg/L   DL/VA % pred 881 %   *Note: Due to a large number of results and/or encounters for the requested time period, some results have not been displayed. A complete set  of results can be found in Results Review.    Assessment & Plan:      Acute left-sided sinusitis Acute bacterial sinusitis with referred pain to ear and throat, negative for strep. - Prescribed Augmentin  for 10 days. - Encouraged increased fluid intake and rest. - Continue allergy medications including Flonase . - Continue nasal saline and salt water  gargles. - Advised to report if symptoms do not improve.  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 - Continue Claritin and Flonase .       Problem List Items Addressed This Visit     Chronic pain syndrome   Requests return to [facility] which is now  available. This was previously more effective than her MSContin. MSIR prescribed TID PRN, averages BID. Refilled #90 to local [facility] pharmacy.       Relevant Medications   morphine  (MSIR) 15 MG tablet   Acute non-recurrent sinusitis - Primary   RST negative Reassuringly normal ear exam. Anticipate left sided sinusitis, present for several weeks but acutely worse over the past 5 days.  Treat for bacterial sinusitis given duration and progression of symptoms. Continue flonase , antihistamine. Further supportive measures as per instructions.  Update if not improving with treatment.  Pt agrees with plan.       Relevant Medications   amoxicillin -clavulanate (AUGMENTIN ) 875-125 MG tablet   Other Visit Diagnoses       Sore throat         Left ear pain            Meds ordered this encounter  Medications   morphine  (MSIR) 15 MG tablet    Sig: TAKE ONE TABLET BY MOUTH EVERY 6 HOURS AS NEEDED FOR SEVERE PAIN    Dispense:  90 tablet    Refill:  0    To replace MS Contin  - if now available   amoxicillin -clavulanate (AUGMENTIN ) 875-125 MG tablet    Sig: Take 1 tablet by mouth 2 (two) times daily for 10 days.    Dispense:  20 tablet    Refill:  0    No orders of the defined types were placed in this encounter.   Patient Instructions  VISIT SUMMARY: Today, you were seen for significant left ear pain and a sore throat that you have been experiencing for five days. The pain has been severe enough to disrupt your sleep and radiates from your left ear to the left side of your throat. You have been using saltwater rinses, hot tea with honey, and Tylenol  for relief. You also resumed Claritin and Flonase  a few weeks ago due to ragweed allergies. There is no sinus drainage, sneezing, coughing, or congestion, and your hearing remains unchanged. However, you feel a sensation of something deep in your ear and have tenderness in the glands on the left side.  YOUR PLAN: -ACUTE LEFT-SIDED  SINUSITIS: This is a bacterial infection of the sinus cavities on the left side of your face, causing pain that can radiate to your ear and throat. You have been prescribed Augmentin  for 10 days to treat the infection. Please increase your fluid intake, get plenty of rest, and continue using your allergy medications, nasal saline, and salt water  gargles. Report back if your symptoms do not improve.  -ALLERGIC RHINITIS: This is an inflammation of the inside of your nose caused by allergies, which has been exacerbated by ragweed. Continue taking Claritin and using Flonase  as you have up to now.  INSTRUCTIONS: Please take Augmentin   as prescribed for 10 days. Continue using Claritin and Flonase  for your allergies, and use nasal saline and salt water  gargles for relief. Increase your fluid intake and get plenty of rest. If your symptoms do not improve, please report back to us .  Follow up plan: No follow-ups on file.  Anton Blas, MD

## 2024-05-01 ENCOUNTER — Ambulatory Visit: Payer: Self-pay | Admitting: Family Medicine

## 2024-05-02 ENCOUNTER — Other Ambulatory Visit: Payer: Self-pay | Admitting: *Deleted

## 2024-05-02 MED ORDER — AMLODIPINE BESYLATE 2.5 MG PO TABS: 2.5000 mg | ORAL_TABLET | Freq: Every day | ORAL | 0 refills | Status: DC

## 2024-05-02 NOTE — Telephone Encounter (Signed)
 Patient contacted the office and request a refill on Norvasc  2.5 mg tablets.  Last Fill: 03/11/2024  Next Visit: 09/11/2024  Last Visit: 03/11/2024  Dx: [name] disease without gangrene   Current Dose per office note on 03/11/2024: amlodipine  7.5 mg daily   Okay to refill Amlodipine ?

## 2024-05-06 ENCOUNTER — Ambulatory Visit (INDEPENDENT_AMBULATORY_CARE_PROVIDER_SITE_OTHER)

## 2024-05-06 DIAGNOSIS — M810 Age-related osteoporosis without current pathological fracture: Secondary | ICD-10-CM | POA: Diagnosis not present

## 2024-05-06 MED ORDER — ROMOSOZUMAB-AQQG 105 MG/1.17ML ~~LOC~~ SOSY
210.0000 mg | PREFILLED_SYRINGE | SUBCUTANEOUS | Status: AC
  Administered 2024-06-11: 210 mg via SUBCUTANEOUS

## 2024-05-06 NOTE — Addendum Note (Signed)
 Addended by: Kyah Buesing P on: 05/06/2024 02:30 PM   Modules accepted: Orders

## 2024-05-06 NOTE — Progress Notes (Signed)
 2nd Evenity  - both injections given in the right arm (SQ), as per Pt's request. Pt tolerated injections well. (IC, CCMA)

## 2024-05-13 ENCOUNTER — Encounter: Payer: Medicare Other | Admitting: Obstetrics and Gynecology

## 2024-05-14 ENCOUNTER — Other Ambulatory Visit: Payer: Self-pay | Admitting: Rheumatology

## 2024-05-14 ENCOUNTER — Encounter: Payer: Self-pay | Admitting: Family Medicine

## 2024-05-14 DIAGNOSIS — G9332 Myalgic encephalomyelitis/chronic fatigue syndrome: Secondary | ICD-10-CM

## 2024-05-14 NOTE — Telephone Encounter (Signed)
 Name of Medication: amphetamine -dextroamphetamine  (ADDERALL  XR) 30 MG 24 hr capsule  Name of Pharmacy: [facility]  Last Fill or Written Date and Quantity: 02/19/24 #90 Last Office Visit and Type: Sinus 04/30/24 f/u 04/10/24 Next Office Visit and Type: f/u 07/12/24 Last Controlled Substance Agreement Date: none  Last UDS: none

## 2024-05-14 NOTE — Telephone Encounter (Signed)
 Last Fill: 03/11/2024  Next Visit: 09/11/2024  Last Visit: 03/11/2024  Dx: Fibromyalgia   Current Dose per office note on 03/11/2024: Lyrica  3 times a day instead of 1 in the morning and 2 at bedtim   Okay to refill Lyrica ?

## 2024-05-15 MED ORDER — AMPHETAMINE-DEXTROAMPHET ER 30 MG PO CP24: ORAL_CAPSULE | ORAL | 0 refills | Status: DC

## 2024-05-15 NOTE — Telephone Encounter (Signed)
 ERx

## 2024-05-21 ENCOUNTER — Telehealth: Payer: Self-pay | Admitting: Gastroenterology

## 2024-05-21 NOTE — Telephone Encounter (Signed)
 Good Morning Dr Charlanne   Patient requesting transfer of car over to Dr Avram due him being her husband provider.   Patient was previously seen by Dr Aneita but had a office visit and you were assigned as her primary gi to review.   Can you please review and advise.

## 2024-05-22 ENCOUNTER — Ambulatory Visit (INDEPENDENT_AMBULATORY_CARE_PROVIDER_SITE_OTHER): Admitting: Obstetrics and Gynecology

## 2024-05-22 ENCOUNTER — Encounter: Payer: Self-pay | Admitting: Obstetrics and Gynecology

## 2024-05-22 VITALS — BP 124/82 | HR 94 | Ht 64.25 in | Wt 127.0 lb

## 2024-05-22 DIAGNOSIS — Z5181 Encounter for therapeutic drug level monitoring: Secondary | ICD-10-CM | POA: Diagnosis not present

## 2024-05-22 DIAGNOSIS — N952 Postmenopausal atrophic vaginitis: Secondary | ICD-10-CM | POA: Diagnosis not present

## 2024-05-22 DIAGNOSIS — M810 Age-related osteoporosis without current pathological fracture: Secondary | ICD-10-CM | POA: Diagnosis not present

## 2024-05-22 DIAGNOSIS — R6882 Decreased libido: Secondary | ICD-10-CM | POA: Diagnosis not present

## 2024-05-22 MED ORDER — NONFORMULARY OR COMPOUNDED ITEM: 2 refills | Status: AC

## 2024-05-22 MED ORDER — INTRAROSA 6.5 MG VA INST: VAGINAL_INSERT | VAGINAL | 3 refills | Status: AC

## 2024-05-22 NOTE — Telephone Encounter (Signed)
Absolutely She will be in good hands with Dr. Cyndia Skeeters

## 2024-05-22 NOTE — Patient Instructions (Signed)

## 2024-05-22 NOTE — Progress Notes (Unsigned)
 GYNECOLOGY  VISIT   HPI: 68 y.o.   Married  Caucasian female   G2P2002 with Patient's last menstrual period was 07/25/1998.   here for: Med check - testosterone   & instrarosa  Needs refill on intrarosa ([facility] home delivery)  Using testosterone  5 days a week.    Patient is followed also for tx osteoporosis with Evenity .    Having pain related to her scoliosis.   Having some urinary urgency, but not all the time.  Drinking a lot of water . 1 coffee per day.  Declines tx.   Doing grief counseling.  Raising her grandchildren.   GYNECOLOGIC HISTORY: Patient's last menstrual period was 07/25/1998. Contraception:  PMP Menopausal hormone therapy:  testosterone  & intrarosa    Last 2 paps:  04/29/21 neg HR HPV neg, 01/31/19 neg  History of abnormal Pap or positive HPV:  no Mammogram:  12/26/23 Breast Density Cat C, BIRADS Cat 1 neg         OB History     Gravida  2   Para  2   Term  2   Preterm      AB      Living  2      SAB      IAB      Ectopic      Multiple      Live Births                 Patient Active Problem List   Diagnosis Date Noted   Hyperphosphatemia 02/20/2024   Fall with injury 11/21/2023   Head trauma, initial encounter 11/06/2023   Chronic cough 06/20/2023   Onycholysis 04/10/2023   Grieving 06/27/2022   Elevated blood pressure reading in office without diagnosis of hypertension 05/31/2022   Closed nondisplaced fracture of lateral end of left clavicle 05/30/2022   Fusion of spine, cervical region    Cervical spine arthritis with nerve pain 12/29/2021   C6 cervical fracture (HCC) 12/29/2021   C5 cervical fracture (HCC) 12/21/2021   Advanced directives, counseling/discussion 09/23/2021   Swelling of right index finger 07/14/2021   Synovitis of right knee    Scoliosis 01/13/2021   Osteoporosis 07/02/2020   Status post left knee surgery 06/01/2020   Pulmonary nodule 05/27/2020   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   Right  knee pain 05/05/2020   Positive ANA (antinuclear antibody) 06/05/2019   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   Unilateral primary osteoarthritis, left hip 01/28/2019   Common bile duct (CBD) obstruction (HCC)    Venous insufficiency of left lower extremity 07/04/2018   Dyslipidemia 09/22/2017   Acute non-recurrent sinusitis 08/02/2017   DDD (degenerative disc disease), cervical 04/03/2017   DDD (degenerative disc disease), lumbar 04/03/2017   Onychomycosis 03/21/2017   Dysphagia 10/18/2016   Encounter for chronic pain management 10/18/2016   Biliary stasis (HCC) 09/26/2016   Dry mouth 06/06/2016   HNP (herniated nucleus pulposus), lumbar 09/23/2015   Iron deficiency 07/30/2015   Health maintenance examination 06/15/2015   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   Clavicle enlargement 12/13/2014   Prediabetes 12/13/2014   Plantar fasciitis, bilateral 09/11/2014   Medicare annual wellness visit, subsequent 06/10/2014   Abnormal thyroid  function test 06/10/2014   Chronic pain syndrome 06/10/2014   CFS (chronic fatigue syndrome) 04/08/2014   Postmenopausal atrophic vaginitis 10/19/2012   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   High-tone pelvic floor dysfunction 06/04/2011   Irritable bowel syndrome 06/04/2011   Cervical disc disorder with radiculopathy of cervical region  05/28/2010   APHTHOUS ULCERS 01/31/2008   Insomnia 11/09/2007   Constipation 10/11/2007   Allergic rhinitis 04/12/2007   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 C6- C7 -hx. past fusion(bone graft used)   Cholecystitis    Chronic pain    DDD (degenerative disc disease), lumbar 09/2015   dextroscoliosis with multilevel DDD and facet arthrosis most notable for R foraminal disc protrusion L4/5 producing severe R neural foraminal stenosis abutting R L4 nerve root, moderate spinal canal and mild lat recesss and R neural foraminal stenosis L3/4 (MRI)   Depression    bipolar depression   Disorders of porphyrin metabolism    Felon of finger of left hand 11/10/2016   Fibromyalgia    GERD (gastroesophageal reflux disease)    Headache    occasionally   Hyperphosphatemia 2025   level 4.5   Internal hemorrhoids    Interstitial cystitis 06/06/2012   hx.   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 (Dr Jarold retinologist)   Tuberculosis    Ulcer     Past Surgical History:  Procedure Laterality Date   ANTERIOR CERVICAL DECOMP/DISCECTOMY FUSION  2004   C5/6, C6/7   ANTERIOR CERVICAL DECOMP/DISCECTOMY FUSION  02/2016   C3/4, C4/5 with plating Gavin)   ANTERIOR CERVICAL DECOMP/DISCECTOMY FUSION  12/29/2021   Procedure: ANTERIOR CERVICAL DECOMPRESSION/ DISCECTOMY FUSION CERVICAL THREE - SEVEN;  Surgeon: Mavis Purchase, MD;  Location: [facility] OR;  Service: Neurosurgery;;   ANTERIOR CERVICAL DECOMPRESSION/DISCECTOMY FUSION 4 LEVELS N/A 12/29/2021   Procedure: CERVICAL FIVE-SIX CORPECTOMY,;  Surgeon: Mavis Purchase, MD;  Location: [facility] OR;  Service: Neurosurgery;  Laterality: N/A;   AUGMENTATION MAMMAPLASTY Bilateral 03/25/2010   BREAST ENHANCEMENT SURGERY  2010   BREAST IMPLANT EXCHANGE  10/2014   exchange saline implants, B mastopexy/capsulorraphy ([facility])   BUNIONECTOMY Bilateral yrs ago   CESAREAN SECTION  1985   x 1   CHOLECYSTECTOMY  06/11/2012   Procedure: LAPAROSCOPIC CHOLECYSTECTOMY WITH INTRAOPERATIVE CHOLANGIOGRAM;  Surgeon: Camellia CHRISTELLA Blush, MD,FACS;  Location: [facility] ORS;  Service: General;  Laterality: N/A;   COLONOSCOPY  02/2018   done for positive cologuard - WNL, rpt 10 yrs Oma)   CYSTOSCOPY     ERCP  05/22/2012   Procedure: ENDOSCOPIC RETROGRADE CHOLANGIOPANCREATOGRAPHY (ERCP);  Surgeon: Gwendlyn ONEIDA Buddy, MD,FACG;  Location: [facility];  Service: Endoscopy;  Laterality: N/A;  ERCP N/A 09/17/2013   Procedure: ENDOSCOPIC RETROGRADE CHOLANGIOPANCREATOGRAPHY (ERCP);  Surgeon: Gwendlyn ONEIDA Buddy, MD;  Location: [facility];  Service: Endoscopy;  Laterality: N/A;   ERCP N/A 09/27/2018   Procedure: ENDOSCOPIC RETROGRADE CHOLANGIOPANCREATOGRAPHY (ERCP);  Surgeon: Buddy Gwendlyn ONEIDA, MD;  Location: [facility];  Service: Endoscopy;  Laterality: N/A;   ESOPHAGOGASTRODUODENOSCOPY  09/2016   WNL. esophagus dilated Oma)   HEMORRHOID BANDING  09/23/2013   --Dr. Camellia Blush   HERNIA REPAIR     inguinal   HYSTEROSCOPY W/ ENDOMETRIAL ABLATION     JOINT REPLACEMENT  03/12/2019   KNEE ARTHROSCOPY Right 04/06/2021   Procedure: RIGHT KNEE ARTHROSCOPY WITH DEBRIDEMENT;  Surgeon: Addie Cordella Hamilton, MD;  Location: [facility];  Service: Orthopedics;  Laterality: Right;   NASAL SINUS SURGERY     x5   PARTIAL KNEE ARTHROPLASTY Right 06/01/2020   Procedure: Right knee patellofemoral replacement;  Surgeon: Addie Cordella Hamilton, MD;  Location: [facility];  Service: Orthopedics;  Laterality: Right;   POSTERIOR CERVICAL FUSION/FORAMINOTOMY N/A 12/29/2021    Procedure: POSTERIOR CERVICAL FUSION CERVICAL THREE-SEVEN;  Surgeon: Mavis Purchase, MD;  Location: [facility] OR;  Service: Neurosurgery;  Laterality: N/A;   REMOVAL OF STONES  09/27/2018   Procedure: REMOVAL OF STONES;  Surgeon: Buddy Gwendlyn ONEIDA, MD;  Location: [facility];  Service: Endoscopy;;   SPHINCTEROTOMY  09/27/2018   Procedure: SPHINCTEROTOMY;  Surgeon: Buddy Gwendlyn ONEIDA, MD;  Location: [facility];  Service: Endoscopy;;   TONSILLECTOMY     removed as a child   TOTAL HIP ARTHROPLASTY Left 03/12/2019   Procedure: LEFT TOTAL HIP ARTHROPLASTY ANTERIOR APPROACH;  Jan, Lonni GRADE, MD)   UPPER GASTROINTESTINAL ENDOSCOPY      Current Outpatient Medications  Medication Sig Dispense Refill   ALPRAZolam  (XANAX ) 1 MG tablet Take 0.5 tablets (0.5 mg total) by mouth at bedtime. 30 tablet 0   amLODipine  (NORVASC ) 2.5 MG tablet Take 1 tablet (2.5 mg total) by mouth daily. 90 tablet 0   amLODipine  (NORVASC ) 5 MG tablet Take 1 tablet (5 mg total) by mouth daily. 90 tablet 0   amphetamine -dextroamphetamine  (ADDERALL  XR) 30 MG 24 hr capsule TAKE 1 CAPSULE BY MOUTH EVERY MORNING 90 capsule 0   ARIPiprazole  (ABILIFY ) 5 MG tablet Take 5 mg by mouth daily.     Calcium Carbonate-Vitamin D  (CALTRATE 600+D PO) Take 2 tablets by mouth daily.     clindamycin  (CLEOCIN ) 150 MG capsule Take 600mg  1 hour prior to dental procedure 10 capsule 0   cyanocobalamin  (VITAMIN B12) 1000 MCG/ML injection INJECT 1ML INTO THE MUSCLE EVERY 21 DAYSAS DIRECTED 4 mL 3   Eszopiclone  3 MG TABS Take 1 tablet (3 mg total) by mouth at bedtime. Take immediately before bedtime 90 tablet 0   ferrous sulfate  325 (65 FE) MG tablet Take 1 tablet (325 mg total) by mouth 2 (two) times a week.     fluticasone  (FLONASE ) 50 MCG/ACT nasal spray Place 2 sprays into both nostrils daily. 16 g 6   lamoTRIgine  (LAMICTAL ) 25 MG tablet Take 50 mg by mouth daily.     lidocaine  (LIDODERM ) 5 % Place 3 patches onto the skin daily. Remove & Discard  patch within 12 hours or as directed by MD 60 patch 0   lidocaine  (XYLOCAINE ) 5 % ointment Apply 1 Application topically as needed. Apply up to 4x/day- on LUE for nerve pain 50 g 6   linaclotide  (LINZESS ) 290 MCG CAPS capsule Take 1 capsule (290 mcg total) by mouth daily.  90 capsule 2   morphine  (MSIR) 15 MG tablet TAKE ONE TABLET BY MOUTH EVERY 6 HOURS AS NEEDED FOR SEVERE PAIN 90 tablet 0   naloxegol  oxalate (MOVANTIK ) 25 MG TABS tablet Take 1 tablet (25 mg total) by mouth daily. 90 tablet 3   ondansetron  (ZOFRAN ) 4 MG tablet TAKE 1 TABLET BY MOUTH EVERY 8 HOURS AS NEEDED FOR NAUSEA AND VOMITING. 30 tablet 0   pantoprazole  (PROTONIX ) 40 MG tablet TAKE ONE TABLET BY MOUTH TWICE DAILY WITH MEALS 180 tablet 3   phenazopyridine  (PYRIDIUM ) 100 MG tablet Take 200 mg by mouth 3 (three) times daily as needed.     polyethylene glycol (MIRALAX  / GLYCOLAX ) 17 g packet Take 17 g by mouth 2 (two) times daily.     pregabalin  (LYRICA ) 75 MG capsule Take 1 capsule (75 mg total) by mouth 3 (three) times daily. 90 capsule 2   tobramycin (TOBREX) 0.3 % ophthalmic solution SMARTSIG:In Eye(s)     ursodiol  (ACTIGALL ) 300 MG capsule Take 1 capsule (300 mg total) by mouth 2 (two) times daily. 180 capsule 3   NONFORMULARY OR COMPOUNDED ITEM Testosterone  proprionate petrolatum (jar) 2% ointment  2 Grams S: Apply a small amount once daily x 5 days per week.  Disp:  30 grams, RF:  none. [facility]. 30 each 2   Prasterone  (INTRAROSA ) 6.5 MG INST UNWRAP AND INSERT 1 SUPPOSITORY  VAGINALLY WITH PROVIDED  APPLICATOR AT BEDTIME 90 each 3   Current Facility-Administered Medications  Medication Dose Route Frequency Provider Last Rate Last Admin   Romosozumab -aqqg (EVENITY ) 105 MG/1. injection 210 mg  210 mg Subcutaneous Q30 days Amundson C Silva, Posie Lillibridge E, MD   210 mg at 05/06/24 1300   [START ON 06/05/2024] Romosozumab -aqqg (EVENITY ) 105 MG/1. injection 210 mg  210 mg Subcutaneous Q30 days Cathlyn JAYSON Nikki Bobie FORBES, MD         ALLERGIES: Celebrex  [celecoxib ], Inh [isoniazid], Cymbalta  [duloxetine  hcl], Nucynta  [tapentadol ], Silenor  [doxepin  hcl], Benzoin, Duragesic -100 [fentanyl ], Keflex  [cephalexin ], Codeine phosphate, Demerol [meperidine hcl], Lithobid [lithium], and Sulfamethoxazole  Family History  Problem Relation Age of Onset   CAD Father 39       MI, nonsmoker   Esophageal cancer Father 60   Stomach cancer Father    Heart disease Father    Scleroderma Mother    Hypertension Mother    Heart disease Mother    Varicose Veins Mother    Esophageal cancer Paternal Grandfather    Stomach cancer Paternal Grandfather    Diabetes Maternal Grandmother    Arthritis Brother    Stroke Neg Hx    Colon cancer Neg Hx    Rectal cancer Neg Hx     Social History   Socioeconomic History   Marital status: Married    Spouse name: Not on file   Number of children: Not on file   Years of education: Not on file   Highest education level: 12th grade  Occupational History   Occupation: retired    Associate Professor: UNEMPLOYED  Tobacco Use   Smoking status: Never    Passive exposure: Never   Smokeless tobacco: Never  Vaping Use   Vaping status: Never Used  Substance and Sexual Activity   Alcohol use: No    Alcohol/week: 0.0 standard drinks of alcohol   Drug use: No   Sexual activity: Yes    Partners: Male    Birth control/protection: Post-menopausal    Comment: vasectomy, 1st intercourse- 89 y.o., partner- 1, DES exposure unknown  Other Topics Concern   Not on file  Social History Narrative   Lives with husband and dog   Caring for 3 grandchildren after daughter Lenis) was killed 05/2022.   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   Activity: tries to walk dog (45 min 3x/wk), yoga   Diet: good water , fruits/vegetables daily      Rheum: [name]   Psychiatrist: Vincente   Surgery: Tanda   GIBETHA Buddy   Urology: Janit   Social Drivers of Health    Financial Resource Strain: Low Risk  (01/10/2024)   Overall Financial Resource Strain (CARDIA)    Difficulty of Paying Living Expenses: Not hard at all  Food Insecurity: No Food Insecurity (01/10/2024)   Hunger Vital Sign    Worried About Running Out of Food in the Last Year: Never true    Ran Out of Food in the Last Year: Never true  Transportation Needs: No Transportation Needs (01/10/2024)   PRAPARE - Administrator, Civil Service (Medical): No    Lack of Transportation (Non-Medical): No  Physical Activity: Insufficiently Active (01/10/2024)   Exercise Vital Sign    Days of Exercise per Week: 3 days    Minutes of Exercise per Session: 40 min  Stress: No Stress Concern Present (01/10/2024)   [employer] of Occupational Health - Occupational Stress Questionnaire    Feeling of Stress: Not at all  Social Connections: Socially Integrated (01/10/2024)   Social Connection and Isolation Panel    Frequency of Communication with Friends and Family: More than three times a week    Frequency of Social Gatherings with Friends and Family: Once a week    Attends Religious Services: More than 4 times per year    Active Member of [employer] or Organizations: Yes    Attends Banker Meetings: More than 4 times per year    Marital Status: Married  Catering Manager Violence: Not At Risk (09/22/2022)   Humiliation, Afraid, Rape, and Kick questionnaire    Fear of Current or Ex-Partner: No    Emotionally Abused: No    Physically Abused: No    Sexually Abused: No    Review of Systems  All other systems reviewed and are negative.   PHYSICAL EXAMINATION:   BP 124/82 (BP Location: Left Arm, Patient Position: Sitting)   Pulse 94   Ht 5' 4.25 (1.632 m)   Wt 127 lb (57.6 kg)   LMP 07/25/1998   SpO2 97%   BMI 21.63 kg/m     General appearance: alert, cooperative and appears stated age Head: Normocephalic, without obvious abnormality, atraumatic Neck: no adenopathy, supple,  symmetrical, trachea midline and thyroid  normal to inspection and palpation Lungs: clear to auscultation bilaterally Breasts: bilateral implants, no masses or tenderness, No nipple retraction or dimpling, No nipple discharge or bleeding, No axillary or supraclavicular adenopathy Heart: regular rate and rhythm Abdomen: soft, non-tender, no masses,  no organomegaly Extremities: extremities normal, atraumatic, no cyanosis or edema Skin: Skin color, texture, turgor normal. No rashes or lesions Lymph nodes: Cervical, supraclavicular, and axillary nodes normal. No abnormal inguinal nodes palpated Neurologic: Grossly normal  Pelvic: External genitalia:  no lesions              Urethra:  normal appearing urethra with no masses, tenderness or lesions              Bartholins and Skenes: normal  Vagina: normal appearing vagina with normal color and discharge, no lesions.  Atrophy noted.               Cervix: no lesions                Bimanual Exam:  Uterus:  normal size, contour, position, consistency, mobility, non-tender              Adnexa: no mass, fullness, tenderness         Chaperone was present for exam:  Kari HERO, CMA  ASSESSMENT:  Menopause.  Bilateral breast augmentation. Vaginal atrophy.  Decreased libido.  Osteoporosis. Medication monitoring.   PLAN:  Will check testosterone  levels.  Refill Intrarosa .  Refill testosterone .  Continue monthly Evenity .  Calcium and vit D recommended.  Next BMD in 2027.  Yearly mammogram and self breast exam recommended.  Follow up in 1 year for breast and pelvic exam and follow up.   33 min  total time was spent for this patient encounter, including preparation, face-to-face counseling with the patient, coordination of care, and documentation of the encounter.

## 2024-05-23 NOTE — Telephone Encounter (Signed)
I will accept her as a patient

## 2024-05-23 NOTE — Telephone Encounter (Signed)
 Good Morning Dr Avram   Patient requesting to transfer care from Dr Charlanne due to her husband being a patient of yours. Can you please review and advise on transfer.   Thank you

## 2024-05-27 ENCOUNTER — Encounter: Payer: Self-pay | Admitting: Radiology

## 2024-05-27 ENCOUNTER — Other Ambulatory Visit: Payer: Self-pay | Admitting: Physician Assistant

## 2024-05-27 DIAGNOSIS — K5903 Drug induced constipation: Secondary | ICD-10-CM

## 2024-05-28 ENCOUNTER — Ambulatory Visit: Payer: Self-pay | Admitting: Obstetrics and Gynecology

## 2024-05-28 LAB — TESTOS,TOTAL,FREE AND SHBG (FEMALE)
Free Testosterone: 0.6 pg/mL (ref 0.1–6.4)
Sex Hormone Binding: 104 nmol/L — ABNORMAL HIGH (ref 14–73)
Testosterone, Total, LC-MS-MS: 9 ng/dL (ref 2–45)

## 2024-05-30 ENCOUNTER — Encounter: Payer: Self-pay | Admitting: Family Medicine

## 2024-05-30 ENCOUNTER — Other Ambulatory Visit (HOSPITAL_COMMUNITY): Payer: Self-pay

## 2024-05-30 ENCOUNTER — Telehealth: Payer: Self-pay

## 2024-05-30 DIAGNOSIS — G894 Chronic pain syndrome: Secondary | ICD-10-CM

## 2024-05-30 NOTE — Telephone Encounter (Signed)
 Rachel Duran

## 2024-05-30 NOTE — Telephone Encounter (Signed)
 Evenity  VOB initiated via [email or web address]  Last OV:  Next OV:  Last Evenity  inj: 05/06/24 Next Evenity  inj DUE: 06/06/24

## 2024-05-30 NOTE — Telephone Encounter (Signed)
 Buy/Bill (Office supplied medication)  Out-of-pocket cost due at time of clinic visit: $0  Number of injection/visits approved: 12  Primary: UHC-MEDICARE Co-insurance: 0% Admin fee co-insurance: 0%  Secondary: --- Co-insurance:  Admin fee co-insurance:   Medical Benefit Details: Date Benefits were checked: 05/30/24 Deductible: $200 Met of $200 Required/ Coinsurance: 0%/ Admin Fee: 0%  Prior Auth: APPROVED PA# [id number] Expiration Date: 03/14/24-03/14/25  # of doses approved: 12 -----------------------------------------------------------------------  Patient NOT eligible for Copay Card. Copay Card can make patient's cost as little as $25. Link to apply: [email or web address]://www.[email or web address].[email or web address]  ** This summary of benefits is an estimation of the patient's out-of-pocket cost. Exact cost may very based on individual plan coverage.

## 2024-05-31 MED ORDER — MORPHINE SULFATE 15 MG PO TABS: ORAL_TABLET | ORAL | 0 refills | Status: DC

## 2024-05-31 NOTE — Telephone Encounter (Signed)
 ERx

## 2024-05-31 NOTE — Telephone Encounter (Signed)
 Name of Medication:  Morphine  (MSIR) 15mg  Name of Pharmacy: [facility]. Last fill date:04/30/24 Last OV 04/30/24 Next OV 07/12/24

## 2024-06-03 ENCOUNTER — Other Ambulatory Visit: Payer: Self-pay | Admitting: Family Medicine

## 2024-06-03 DIAGNOSIS — G894 Chronic pain syndrome: Secondary | ICD-10-CM

## 2024-06-03 NOTE — Telephone Encounter (Signed)
 [facility] they have sent request to have sent to patient local pharmacy they are compound only.

## 2024-06-04 ENCOUNTER — Encounter (HOSPITAL_COMMUNITY): Admitting: Internal Medicine

## 2024-06-04 ENCOUNTER — Ambulatory Visit (HOSPITAL_COMMUNITY)
Admission: RE | Admit: 2024-06-04 | Discharge: 2024-06-04 | Disposition: A | Discharge status: Home / Self Care | Source: Ambulatory Visit | Attending: Internal Medicine | Admitting: Internal Medicine

## 2024-06-04 DIAGNOSIS — I3481 Nonrheumatic mitral (valve) annulus calcification: Secondary | ICD-10-CM | POA: Insufficient documentation

## 2024-06-04 DIAGNOSIS — M349 Systemic sclerosis, unspecified: Secondary | ICD-10-CM | POA: Diagnosis not present

## 2024-06-04 DIAGNOSIS — I358 Other nonrheumatic aortic valve disorders: Secondary | ICD-10-CM | POA: Diagnosis not present

## 2024-06-04 DIAGNOSIS — I10 Essential (primary) hypertension: Secondary | ICD-10-CM | POA: Insufficient documentation

## 2024-06-04 LAB — ECHOCARDIOGRAM COMPLETE
Area-P 1/2: 4.31 cm2
S' Lateral: 2.6 cm

## 2024-06-04 MED ORDER — MORPHINE SULFATE 15 MG PO TABS: ORAL_TABLET | ORAL | 0 refills | Status: DC

## 2024-06-04 NOTE — Addendum Note (Signed)
 Addended by: RILLA BALLER on: 06/04/2024 05:35 PM   Modules accepted: Orders

## 2024-06-05 ENCOUNTER — Ambulatory Visit (HOSPITAL_COMMUNITY)
Admission: RE | Admit: 2024-06-05 | Discharge: 2024-06-05 | Disposition: A | Discharge status: Home / Self Care | Source: Ambulatory Visit | Attending: Internal Medicine | Admitting: Internal Medicine

## 2024-06-05 VITALS — BP 124/80 | HR 79 | Wt 126.4 lb

## 2024-06-05 DIAGNOSIS — Z79899 Other long term (current) drug therapy: Secondary | ICD-10-CM | POA: Diagnosis not present

## 2024-06-05 DIAGNOSIS — M349 Systemic sclerosis, unspecified: Secondary | ICD-10-CM | POA: Insufficient documentation

## 2024-06-05 DIAGNOSIS — R9431 Abnormal electrocardiogram [ECG] [EKG]: Secondary | ICD-10-CM | POA: Diagnosis not present

## 2024-06-05 DIAGNOSIS — I2510 Atherosclerotic heart disease of native coronary artery without angina pectoris: Secondary | ICD-10-CM

## 2024-06-05 DIAGNOSIS — I10 Essential (primary) hypertension: Secondary | ICD-10-CM | POA: Diagnosis not present

## 2024-06-05 NOTE — Patient Instructions (Signed)
  Follow-Up in: 2 years with an ECHO and PFT's   At the [facility], you and your health needs are our priority. We have a designated team specialized in the treatment of Heart Failure. This Care Team includes your primary Heart Failure Specialized Cardiologist (physician), Advanced Practice Providers (APPs- Physician Assistants and Nurse Practitioners), and Pharmacist who all work together to provide you with the care you need, when you need it.   You may see any of the following providers on your designated Care Team at your next follow up:  Dr. Toribio Fuel Dr. Ezra Shuck Dr. Odis Brownie Greig Mosses, NP Caffie Shed, [place] [facility] [place], [place] Beckey Coe, NP Jordan Lee, NP Tinnie Redman, PharmD   Please be sure to bring in all your medications bottles to every appointment.   Need to Contact Us :  If you have any questions or concerns before your next appointment please send us  a message through [place] or call our office at [phone].    TO LEAVE A MESSAGE FOR THE NURSE SELECT OPTION 2, PLEASE LEAVE A MESSAGE INCLUDING: YOUR NAME DATE OF BIRTH CALL BACK NUMBER REASON FOR CALL**this is important as we prioritize the call backs  YOU WILL RECEIVE A CALL BACK THE SAME DAY AS LONG AS YOU CALL BEFORE 4:00 PM

## 2024-06-05 NOTE — Progress Notes (Signed)
 [facility] CLINIC NOTE  Referring Physician: Dr. Dolphus Primary Care: Rilla Baller, MD    HPI:  Ms. Rhines is a 68 y/o woman with bipolar d/o, HTN, fibromyalgia, systemic sclerosis referred by Dr. Dolphus for pulmonary HTN surveillance in setting of CTD.   Has a complicated medical history but denies any h/o known cardiac disease.   Had echo in 7/14 for LE edema which was normal. Had hi-res CT with Dr.Mannam on 05/08/20. No ILD. Evidence of air trapping suggestive of small airway disease. 6mm pulmonary nodule.   Follows with Dr. Theophilus. Chest CT 05/01/21 stable nodule. No ILD. + coronary artery calcium   Echo 06/23/21 EF 55% RV normal. No evidence PAH or RV strain   Echo 06/29/22: EF 55-60 % RV normal. No evidence PAH or RV strain   PFTs 12/21 FEV1 2.59 (101%) FVC 3.23 (97%) Ratio 80% DLCO 85%  Echo 06/04/24 EF 55-60% RV normal trivial TR  PFTs 9/25 FEV1 2.57 (107%) FVC 3.25 (103%) Ratio 77% DLCO 99%  Here for bi-annual screening for PAH with scleroderma. Doing well. Denies SOB, edema. Remains active with walking and taking care of grandkids. No recent scleroderma flares. Follows with Dr. Dolphus   Past Medical History:  Diagnosis Date   Abdominal pain last 4 months   and nausea also   Allergy    Anemia    history of   Anxiety    Bilateral pneumonia 11/21/2023   Bipolar disorder (HCC)    atpical bipolar disorder   Cervical disc disease limited rom turning to left   hx. C6- C7 -hx. past fusion(bone graft used)   Cholecystitis    Chronic pain    DDD (degenerative disc disease), lumbar 09/2015   dextroscoliosis with multilevel DDD and facet arthrosis most notable for R foraminal disc protrusion L4/5 producing severe R neural foraminal stenosis abutting R L4 nerve root, moderate spinal canal and mild lat recesss and R neural foraminal stenosis L3/4 (MRI)   Depression    bipolar depression   Disorders of porphyrin metabolism    Felon of finger of left  hand 11/10/2016   Fibromyalgia    GERD (gastroesophageal reflux disease)    Headache    occasionally   Hyperphosphatemia 2025   level 4.5   Internal hemorrhoids    Interstitial cystitis 06/06/2012   hx.   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 30 tablet 0   amLODipine  (NORVASC ) 2.5 MG tablet Take 1 tablet (2.5 mg total) by mouth daily. 90 tablet 0   amLODipine  (NORVASC ) 5 MG tablet Take 1 tablet (5 mg total) by mouth daily. 90 tablet 0   amphetamine -dextroamphetamine  (ADDERALL  XR) 30 MG 24 hr capsule TAKE 1 CAPSULE BY MOUTH EVERY MORNING 90  capsule 0   ARIPiprazole  (ABILIFY ) 5 MG tablet Take 5 mg by mouth daily.     Calcium Carbonate-Vitamin D  (CALTRATE 600+D PO) Take 2 tablets by mouth daily.     Camphor-Menthol -Methyl Sal (SALONPAS EX) Apply 1 patch topically at bedtime.     clindamycin  (CLEOCIN ) 150 MG capsule Take 600mg  1 hour prior to dental procedure 10 capsule 0   cyanocobalamin  (VITAMIN B12) 1000 MCG/ML injection INJECT 1ML INTO THE MUSCLE EVERY 21 DAYSAS DIRECTED 4 mL 3   Eszopiclone  3 MG TABS Take 1 tablet (3 mg total) by mouth at bedtime. Take immediately before bedtime 90 tablet 0   ferrous sulfate  325 (65 FE) MG tablet Take 1 tablet (325 mg total) by mouth 2 (two) times a week.     fluticasone  (FLONASE ) 50 MCG/ACT  nasal spray Place 2 sprays into both nostrils as needed for allergies or rhinitis.     lamoTRIgine  (LAMICTAL ) 25 MG tablet Take 50 mg by mouth daily.     lidocaine  (XYLOCAINE ) 5 % ointment Apply 1 Application topically as needed. Apply up to 4x/day- on LUE for nerve pain 50 g 6   LINZESS  290 MCG CAPS capsule TAKE 1 CAPSULE BY MOUTH ONCE DAILY 90 capsule 2   morphine  (MSIR) 15 MG tablet TAKE ONE TABLET BY MOUTH EVERY 6 HOURS AS NEEDED FOR SEVERE PAIN 90 tablet 0   naloxegol  oxalate (MOVANTIK ) 25 MG TABS tablet Take 1 tablet (25 mg total) by mouth daily. 90 tablet 3   NONFORMULARY OR COMPOUNDED ITEM Testosterone  proprionate petrolatum (jar) 2% ointment  2 Grams S: Apply a small amount once daily x 5 days per week.  Disp:  30 grams, RF:  none. [facility]. 30 each 2   ondansetron  (ZOFRAN ) 4 MG tablet TAKE 1 TABLET BY MOUTH EVERY 8 HOURS AS NEEDED FOR NAUSEA AND VOMITING. 30 tablet 0   pantoprazole  (PROTONIX ) 40 MG tablet TAKE ONE TABLET BY MOUTH TWICE DAILY WITH MEALS 180 tablet 3   phenazopyridine  (PYRIDIUM ) 100 MG tablet Take 200 mg by mouth 3 (three) times daily as needed.     polyethylene glycol (MIRALAX  / GLYCOLAX ) 17 g packet Take 17 g by mouth 2 (two) times daily.     Prasterone  (INTRAROSA ) 6.5 MG INST UNWRAP AND INSERT 1 SUPPOSITORY  VAGINALLY WITH PROVIDED  APPLICATOR AT BEDTIME 90 each 3   pregabalin  (LYRICA ) 75 MG capsule Take 1 capsule (75 mg total) by mouth 3 (three) times daily. 90 capsule 2   Romosozumab -aqqg (EVENITY  [place]) Inject 1 Application into the skin every 30 (thirty) days.     tobramycin (TOBREX) 0.3 % ophthalmic solution SMARTSIG:In Eye(s)     ursodiol  (ACTIGALL ) 300 MG capsule Take 1 capsule (300 mg total) by mouth 2 (two) times daily. 180 capsule 3   Current Facility-Administered Medications  Medication Dose Route Frequency Provider Last Rate Last Admin   Romosozumab -aqqg (EVENITY ) 105 MG/1. injection 210 mg  210 mg Subcutaneous Q30 days Amundson C Silva, Brook  E, MD   210 mg at 05/06/24 1300   Romosozumab -aqqg (EVENITY ) 105 MG/1. injection 210 mg  210 mg Subcutaneous Q30 days Cathlyn JAYSON Nikki Bobie FORBES, MD        Allergies  Allergen Reactions   Celebrex  [Celecoxib ] Rash    Headaches   Inh [Isoniazid] Other (See Comments)    Hepatitis   Cymbalta  [Duloxetine  Hcl] Other (See Comments)    Urinary retention   Nucynta  [Tapentadol ] Other (See Comments)    Agitation Jerking in legs  Silenor  [Doxepin  Hcl] Other (See Comments)    Nightmare Dizziness  Stumbling upon walking Not effective   Benzoin Dermatitis   Duragesic -100 [Fentanyl ] Other (See Comments)    Cold intolerance Arms twitching Insomnia  Fatigue    Keflex  [Cephalexin ] Nausea And Vomiting   Codeine Phosphate Nausea And Vomiting   Demerol [Meperidine Hcl] Rash    Red skin   Lithobid [Lithium] Other (See Comments)    Sores in mouth    Sulfamethoxazole Rash      Social History   Socioeconomic History   Marital status: Married    Spouse name: Not on file   Number of children: Not on file   Years of education: Not on file   Highest education level: 12th grade  Occupational History   Occupation: retired    Associate Professor: UNEMPLOYED  Tobacco Use   Smoking status: Never    Passive exposure: Never   Smokeless tobacco: Never  Vaping Use   Vaping status: Never Used  Substance and Sexual Activity   Alcohol use: No    Alcohol/week: 0.0 standard drinks of alcohol   Drug use: No   Sexual activity: Yes    Partners: Male    Birth control/protection: Post-menopausal    Comment: vasectomy, 1st intercourse- 89 y.o., partner- 1, DES exposure unknown  Other Topics Concern   Not on file  Social History Narrative   Lives with husband and dog   Caring for 3 grandchildren after daughter Lenis) was killed 05/2022.   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   Activity: tries to walk dog (45 min 3x/wk), yoga   Diet: good water , fruits/vegetables  daily      Rheum: [name]   Psychiatrist: Vincente   Surgery: Tanda   GIBETHA Buddy   Urology: Janit   Social Drivers of Health   Financial Resource Strain: Low Risk  (01/10/2024)   Overall Financial Resource Strain (CARDIA)    Difficulty of Paying Living Expenses: Not hard at all  Food Insecurity: No Food Insecurity (01/10/2024)   Hunger Vital Sign    Worried About Running Out of Food in the Last Year: Never true    Ran Out of Food in the Last Year: Never true  Transportation Needs: No Transportation Needs (01/10/2024)   PRAPARE - Administrator, Civil Service (Medical): No    Lack of Transportation (Non-Medical): No  Physical Activity: Insufficiently Active (01/10/2024)   Exercise Vital Sign    Days of Exercise per Week: 3 days    Minutes of Exercise per Session: 40 min  Stress: No Stress Concern Present (01/10/2024)   [employer] of Occupational Health - Occupational Stress Questionnaire    Feeling of Stress: Not at all  Social Connections: Socially Integrated (01/10/2024)   Social Connection and Isolation Panel    Frequency of Communication with Friends and Family: More than three times a week    Frequency of Social Gatherings with Friends and Family: Once a week    Attends Religious Services: More than 4 times per year    Active Member of [employer] or Organizations: Yes    Attends Banker Meetings: More than 4 times per year    Marital Status: Married  Catering Manager Violence: Not At Risk (09/22/2022)   Humiliation, Afraid, Rape, and Kick questionnaire    Fear of Current or Ex-Partner: No    Emotionally Abused: No    Physically Abused: No    Sexually Abused: No  Family History  Problem Relation Age of Onset   CAD Father 13       MI, nonsmoker   Esophageal cancer Father 58   Stomach cancer Father    Heart disease Father    Scleroderma Mother    Hypertension Mother    Heart disease Mother    Varicose Veins Mother    Esophageal cancer  Paternal Grandfather    Stomach cancer Paternal Grandfather    Diabetes Maternal Grandmother    Arthritis Brother    Stroke Neg Hx    Colon cancer Neg Hx    Rectal cancer Neg Hx     Vitals:   06/05/24 1147  BP: 124/80  Pulse: 79  SpO2: 100%  Weight: 57.3 kg (126 lb 6.4 oz)      PHYSICAL EXAM: General:  Sitting up in bed. No resp difficulty HEENT: normal + telenjectasias Neck: supple. no JVD.  Cor: Regular rate & rhythm. No rubs, gallops or murmurs. Lungs: clear Abdomen: soft, nontender, nondistended.Good bowel sounds. Extremities: no cyanosis, clubbing, rash, edema + arthritic changes Neuro: alert & orientedx3, cranial nerves grossly intact. moves all 4 extremities w/o difficulty. Affect pleasant  ECG: NSR 80 No ST-T wave abnormalities. Personally reviewed    ASSESSMENT & PLAN:  1. Systemic sclerosis - hi-res chest CT 11/21 - no evidence of ILD - PFTs ok 07/07/20 - 06/22/20 EF 55-60% RV normal no evidence PAH - Echo 06/23/21 EF 55% RV normal. No evidence PAH or RV strain  - Echo 06/29/22: EF 55-60 % RV normal. No evidence PAH or RV strain  - Echo 06/04/24 EF 55-60% RV normal triv TR - PFTs 9/25 normal  - Echo and PFT reviewed with her. No evidence of PAH or RV strain. - Continue screening q 2years in setting of systemic sclerosis  2. HTN - Blood pressure well controlled. Continue current regimen.    Toribio Fuel, MD  11:50 AM

## 2024-06-11 ENCOUNTER — Ambulatory Visit (INDEPENDENT_AMBULATORY_CARE_PROVIDER_SITE_OTHER)

## 2024-06-11 DIAGNOSIS — M810 Age-related osteoporosis without current pathological fracture: Secondary | ICD-10-CM | POA: Diagnosis not present

## 2024-06-11 NOTE — Progress Notes (Signed)
 Evenity  #3 - Both injections given in the right arm, per Pt's request.  (IC, CCMA)

## 2024-07-03 ENCOUNTER — Other Ambulatory Visit: Payer: Self-pay | Admitting: Family

## 2024-07-03 DIAGNOSIS — R110 Nausea: Secondary | ICD-10-CM

## 2024-07-05 ENCOUNTER — Telehealth: Payer: Self-pay

## 2024-07-05 ENCOUNTER — Other Ambulatory Visit: Payer: Self-pay | Admitting: Family Medicine

## 2024-07-05 DIAGNOSIS — G894 Chronic pain syndrome: Secondary | ICD-10-CM

## 2024-07-05 NOTE — Telephone Encounter (Signed)
 Copied from CRM #[id number]. Topic: Clinical - Prescription Issue >> Jul 05, 2024 12:33 PM Ashley R wrote: Reason for CRM: morphine  (MSIR) 15 MG tablet is in short supply according to pharmacy. Requesting approval as soon as possible. May need to be routed to another pharmacy if not as wait times can be several weeks.

## 2024-07-06 NOTE — Telephone Encounter (Signed)
 ERx

## 2024-07-08 ENCOUNTER — Other Ambulatory Visit: Payer: Self-pay | Admitting: Family Medicine

## 2024-07-08 DIAGNOSIS — G4700 Insomnia, unspecified: Secondary | ICD-10-CM

## 2024-07-08 NOTE — Telephone Encounter (Signed)
 Refill has been sent in another request.  No further action needed at this time.

## 2024-07-09 NOTE — Telephone Encounter (Signed)
 Requesting: Eszopiclone  3 MG TABS  Contract: Yes UDS: [id number]  Last Visit: 07/05/2024 Next Visit: 07/12/2024 Last Refill: 04/10/24  Please Advise

## 2024-07-10 NOTE — Telephone Encounter (Signed)
 ERx

## 2024-07-12 ENCOUNTER — Encounter: Payer: Self-pay | Admitting: Family Medicine

## 2024-07-12 ENCOUNTER — Ambulatory Visit: Admitting: Family Medicine

## 2024-07-12 VITALS — BP 120/76 | HR 77 | Temp 98.6°F | Ht 64.25 in | Wt 129.0 lb

## 2024-07-12 DIAGNOSIS — M4322 Fusion of spine, cervical region: Secondary | ICD-10-CM

## 2024-07-12 DIAGNOSIS — B001 Herpesviral vesicular dermatitis: Secondary | ICD-10-CM | POA: Diagnosis not present

## 2024-07-12 DIAGNOSIS — M5030 Other cervical disc degeneration, unspecified cervical region: Secondary | ICD-10-CM | POA: Diagnosis not present

## 2024-07-12 DIAGNOSIS — G8929 Other chronic pain: Secondary | ICD-10-CM

## 2024-07-12 DIAGNOSIS — M51362 Other intervertebral disc degeneration, lumbar region with discogenic back pain and lower extremity pain: Secondary | ICD-10-CM | POA: Diagnosis not present

## 2024-07-12 DIAGNOSIS — M797 Fibromyalgia: Secondary | ICD-10-CM | POA: Diagnosis not present

## 2024-07-12 MED ORDER — VALACYCLOVIR HCL 1 G PO TABS: 2000.0000 mg | ORAL_TABLET | Freq: Two times a day (BID) | ORAL | 1 refills | Status: AC

## 2024-07-12 NOTE — Patient Instructions (Addendum)
 Update urine drug screen today - if unable we can collect next visit.  Good to see you today  Return as needed or in 3 months for physical/wellness visit.  For fever blister - take valtrex  1 gram 2 tablets twice daily for 1 day per flare.

## 2024-07-12 NOTE — Progress Notes (Signed)
 " Ph: [phone] Fax: [phone]   Patient ID: Rachel Duran, female    DOB: 01-Oct-1955, 68 y.o.   MRN: [id number]  This visit was conducted in person.  BP 120/76   Pulse 77   Temp 98.6 F (37 C) (Oral)   Ht 5' 4.25 (1.632 m)   Wt 129 lb (58.5 kg)   LMP 07/25/1998   SpO2 100%   BMI 21.97 kg/m    CC: 3 mo f/u visit  Subjective:   HPI: Rachel Duran is a 68 y.o. female presenting on 07/12/2024 for Medical Management of Chronic Issues   Chronic pain on morphine  (see below), lyrica  150mg  BID, lidocaine  patches 1 patch nightly. H/o multiple spine surgeries with residual pain, latest displaced clavicle followed by cervical C5/6 fracture subluxation s/p repair after fall. H/o prior cervical spine fusion.   Back on MSIR 15mg  (prescribed TID PRN #90/mo but usually takes BID). Was previously on MSContin 30mg  once daily due to nationwide shortage but MSIR works better.   Limited systemic sclerosis, OA, raynaud's disease and FM - followed by rheumatology. 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 She did undergo ERCP and cholecystectomy after this as well. Saw ID Dr Luiz 03/2013 - plan at that time was 2-4 months of rifampin  however was also unable to tolerate this treatment. 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   Trouble with other opiates previously tried - demerol (rash), codeine /hydrocodone /oxycodone  (nausea/vomiting), nucynta  (leg jerking) and fentanyl  (cold, insomnia, fatigue).   She is stressed due to upcoming Christmas holidays - full caregiver for grandchildren.   Not feeling well today - yesterday had GI upset, today woke up with cold sore to R lower lip.      Relevant past medical, surgical, family and social history reviewed and updated as indicated. Interim medical history since our last visit reviewed. Allergies and  medications reviewed and updated. Outpatient Medications Prior to Visit  Medication Sig Dispense Refill   ALPRAZolam  (XANAX ) 1 MG tablet Take 0.5 tablets (0.5 mg total) by mouth at bedtime. 30 tablet 0   amLODipine  (NORVASC ) 2.5 MG tablet Take 1 tablet (2.5 mg total) by mouth daily. 90 tablet 0   amLODipine  (NORVASC ) 5 MG tablet Take 1 tablet (5 mg total) by mouth daily. 90 tablet 0   amphetamine -dextroamphetamine  (ADDERALL  XR) 30 MG 24 hr capsule TAKE 1 CAPSULE BY MOUTH EVERY MORNING 90 capsule 0   ARIPiprazole  (ABILIFY ) 5 MG tablet Take 5 mg by mouth daily.     Calcium Carbonate-Vitamin D  (CALTRATE 600+D PO) Take 2 tablets by mouth daily.     Camphor-Menthol -Methyl Sal (SALONPAS EX) Apply 1 patch topically at bedtime.     cyanocobalamin  (VITAMIN B12) 1000 MCG/ML injection INJECT 1ML INTO THE MUSCLE EVERY 21 DAYSAS DIRECTED 4 mL 3   Eszopiclone  3 MG TABS TAKE ONE TABLET BY MOUTH AT BEDTIME.TAKEIMMEDIATELY BEFORE BEDTIME 90 tablet 0   ferrous sulfate  325 (65 FE) MG tablet Take 1 tablet (325 mg total) by mouth 2 (two) times a week.     fluticasone  (FLONASE ) 50 MCG/ACT nasal spray Place 2 sprays into both nostrils as needed for allergies or rhinitis.     lamoTRIgine  (LAMICTAL ) 25 MG tablet Take 50 mg by mouth daily.     lidocaine  (XYLOCAINE ) 5 % ointment Apply 1 Application topically as needed. Apply up to 4x/day- on LUE for nerve  pain 50 g 6   LINZESS  290 MCG CAPS capsule TAKE 1 CAPSULE BY MOUTH ONCE DAILY 90 capsule 2   morphine  (MSIR) 15 MG tablet TAKE ONE TABLET EVERY SIX HOURS IF NEEDED FOR SEVERE PAIN 90 tablet 0   naloxegol  oxalate (MOVANTIK ) 25 MG TABS tablet Take 1 tablet (25 mg total) by mouth daily. 90 tablet 3   NONFORMULARY OR COMPOUNDED ITEM Testosterone  proprionate petrolatum (jar) 2% ointment  2 Grams S: Apply a small amount once daily x 5 days per week.  Disp:  30 grams, RF:  none. [facility]. 30 each 2   ondansetron  (ZOFRAN ) 4 MG tablet TAKE ONE TABLET BY MOUTH EVERY 8  HOURS AS NEEDED FOR NAUSEA AND VOMITING 30 tablet 0   pantoprazole  (PROTONIX ) 40 MG tablet TAKE ONE TABLET BY MOUTH TWICE DAILY WITH MEALS 180 tablet 3   phenazopyridine  (PYRIDIUM ) 100 MG tablet Take 200 mg by mouth 3 (three) times daily as needed.     polyethylene glycol (MIRALAX  / GLYCOLAX ) 17 g packet Take 17 g by mouth 2 (two) times daily.     Prasterone  (INTRAROSA ) 6.5 MG INST UNWRAP AND INSERT 1 SUPPOSITORY  VAGINALLY WITH PROVIDED  APPLICATOR AT BEDTIME 90 each 3   pregabalin  (LYRICA ) 75 MG capsule Take 1 capsule (75 mg total) by mouth 3 (three) times daily. 90 capsule 2   Romosozumab -aqqg (EVENITY  [place]) Inject 1 Application into the skin every 30 (thirty) days.     tobramycin (TOBREX) 0.3 % ophthalmic solution SMARTSIG:In Eye(s)     ursodiol  (ACTIGALL ) 300 MG capsule Take 1 capsule (300 mg total) by mouth 2 (two) times daily. 180 capsule 3   clindamycin  (CLEOCIN ) 150 MG capsule Take 600mg  1 hour prior to dental procedure (Patient not taking: Reported on 07/12/2024) 10 capsule 0   Facility-Administered Medications Prior to Visit  Medication Dose Route Frequency Provider Last Rate Last Admin   Romosozumab -aqqg (EVENITY ) 105 MG/1. injection 210 mg  210 mg Subcutaneous Q30 days Cathlyn JAYSON Nikki Bobie FORBES, MD   210 mg at 05/06/24 1300     Per HPI unless specifically indicated in ROS section below Review of Systems  Objective:  BP 120/76   Pulse 77   Temp 98.6 F (37 C) (Oral)   Ht 5' 4.25 (1.632 m)   Wt 129 lb (58.5 kg)   LMP 07/25/1998   SpO2 100%   BMI 21.97 kg/m   Wt Readings from Last 3 Encounters:  07/12/24 129 lb (58.5 kg)  06/05/24 126 lb 6.4 oz (57.3 kg)  05/22/24 127 lb (57.6 kg)      Physical Exam Vitals and nursing note reviewed.  Constitutional:      Appearance: Normal appearance. She is not ill-appearing.  HENT:     Mouth/Throat:     Lips: Lesions present.     Mouth: Mucous membranes are moist.     Pharynx: Oropharynx is clear. No oropharyngeal exudate or  posterior oropharyngeal erythema.      Comments: Swelling with erythema and small blisters to R lower lip Eyes:     Extraocular Movements: Extraocular movements intact.     Pupils: Pupils are equal, round, and reactive to light.  Cardiovascular:     Rate and Rhythm: Normal rate and regular rhythm.     Pulses: Normal pulses.     Heart sounds: Normal heart sounds. No murmur heard. Pulmonary:     Effort: Pulmonary effort is normal. No respiratory distress.     Breath sounds: Normal breath  sounds. No wheezing, rhonchi or rales.  Musculoskeletal:     Right lower leg: No edema.     Left lower leg: No edema.  Skin:    Findings: Lesion present.  Neurological:     Mental Status: She is alert.  Psychiatric:        Mood and Affect: Mood normal.       Results for orders placed or performed during the hospital encounter of 06/04/24  ECHOCARDIOGRAM COMPLETE   Collection Time: 06/04/24 11:32 AM  Result Value Ref Range   S' Lateral 2.60 cm   Area-P 1/2 4.31 cm2   Est EF 55 - 60%    *Note: Due to a large number of results and/or encounters for the requested time period, some results have not been displayed. A complete set of results can be found in Results Review.    Assessment & Plan:   Problem List Items Addressed This Visit     Encounter for chronic pain management - Primary (Chronic)   [place] CSRS reviewed. Continue MSIR 15mg  TID PRN (#90/month) - usually takes BID so lasts longer than a month.  Update UDS as last done 2020 - she was unable to leave sample, will update next visit.       Fibromyalgia   Fever blister   Started yesterday ERx valtrex , reviewed side effects to watch for.       Relevant Medications   valACYclovir  (VALTREX ) 1000 MG tablet   DDD (degenerative disc disease), cervical   DDD (degenerative disc disease), lumbar   Fusion of spine, cervical region     Meds ordered this encounter  Medications   valACYclovir  (VALTREX ) 1000 MG tablet    Sig: Take 2  tablets (2,000 mg total) by mouth 2 (two) times daily for 1 day. Per flare of fever blister    Dispense:  20 tablet    Refill:  1    No orders of the defined types were placed in this encounter.   Patient Instructions  Update urine drug screen today - if unable we can collect next visit.  Good to see you today  Return as needed or in 3 months for physical/wellness visit.  For fever blister - take valtrex  1 gram 2 tablets twice daily for 1 day per flare.   Follow up plan: Return in about 3 months (around 10/10/2024) for annual exam, prior fasting for blood work, medicare wellness visit.  Anton Blas, MD   "

## 2024-07-12 NOTE — Assessment & Plan Note (Signed)
 Started yesterday ERx valtrex , reviewed side effects to watch for.

## 2024-07-12 NOTE — Assessment & Plan Note (Addendum)
 [place] CSRS reviewed. Continue MSIR 15mg  TID PRN (#90/month) - usually takes BID so lasts longer than a month.  Update UDS as last done 2020 - she was unable to leave sample, will update next visit.

## 2024-07-15 ENCOUNTER — Other Ambulatory Visit: Payer: Self-pay | Admitting: Rheumatology

## 2024-07-15 ENCOUNTER — Ambulatory Visit (INDEPENDENT_AMBULATORY_CARE_PROVIDER_SITE_OTHER)

## 2024-07-15 ENCOUNTER — Other Ambulatory Visit: Payer: Self-pay | Admitting: Physician Assistant

## 2024-07-15 DIAGNOSIS — M810 Age-related osteoporosis without current pathological fracture: Secondary | ICD-10-CM

## 2024-07-16 ENCOUNTER — Telehealth: Payer: Self-pay | Admitting: *Deleted

## 2024-07-16 ENCOUNTER — Other Ambulatory Visit: Payer: Self-pay | Admitting: Rheumatology

## 2024-07-16 NOTE — Telephone Encounter (Signed)
 Spoke with patient. Patient received 4th Evenity  injection on 07/15/24. Patient reports small red raised bumps on her chest yesterday afternoon. Denies itching, SOB, difficulty breathing or wheezing. States the rash has faded this morning. Denies any other new medications, foods or detergents.   Patient will continue to monitor. If rash returns and any of the following symptoms develop, seek immediate evaluation at local ER: SOB, difficulty breathing or wheezing. Can take OTC benadryl  for itching or rash if needed.   Will update Dr. Nikki and manufacture.   Ok to proceed with Evenity  unless Dr. Nikki advises otherwise once reviewed. Our office will call with any additional recommendations. Patient agreeable.   Routing to Dr. Nikki  Cc: Damien LIPS

## 2024-07-16 NOTE — Telephone Encounter (Signed)
 Last Fill: 05/02/2024  Next Visit: 09/11/2024  Last Visit: 03/11/2024  Dx: [name] disease without gangrene   Current Dose per office note on 03/11/2024: amlodipine  7.5 mg daily   Okay to refill Amlodipine ?

## 2024-07-17 ENCOUNTER — Other Ambulatory Visit: Payer: Self-pay | Admitting: Physician Assistant

## 2024-07-19 NOTE — Telephone Encounter (Signed)
 Spoke with patient.  Patient states rash cleared up after 2 days.  Notified patient of Dr. Cyrilla recommendation to discontinue Evenity  and start Prolia. Message sent to patient from Dr. Nikki via MyChart as well. Patient verbalized understanding and agreed with plan.

## 2024-07-19 NOTE — Telephone Encounter (Signed)
 Patient may have developed an allergic reaction to the Evenity , so I recommend discontinuation of this medication.   I placed this on her allergy list.   She can start Prolia in one month as her next treatment for osteoporosis.  This is an office injection every 6 months.    This should be well tolerated.  There is some increased risk of infection with use of Prolia.  Prolia should not be discontinued without substituting another bone building medication due to increased risk of compression fracture of the spine with its discontinuation.

## 2024-07-30 ENCOUNTER — Telehealth: Payer: Self-pay

## 2024-07-30 ENCOUNTER — Other Ambulatory Visit (HOSPITAL_COMMUNITY): Payer: Self-pay

## 2024-07-30 DIAGNOSIS — M810 Age-related osteoporosis without current pathological fracture: Secondary | ICD-10-CM

## 2024-07-30 MED ORDER — DENOSUMAB 60 MG/ML ~~LOC~~ SOSY: 60.0000 mg | PREFILLED_SYRINGE | Freq: Once | SUBCUTANEOUS | Status: AC

## 2024-07-30 NOTE — Telephone Encounter (Signed)
 [name]

## 2024-07-30 NOTE — Telephone Encounter (Signed)
 Referral for Evenity  closed and new referral initiated for Prolia .   Encounter closed.

## 2024-07-30 NOTE — Telephone Encounter (Signed)
 MEDICAL PA APPROVAL:  Authorization Number: [id number] 07/30/24-07/30/25

## 2024-07-30 NOTE — Telephone Encounter (Signed)
 Prolia  VOB initiated via [email or web address]  Next Prolia  inj DUE: NEW START   JUBB[facility]  PREFERRED FOR PHARMACY COPAY: $150

## 2024-07-30 NOTE — Telephone Encounter (Signed)
 Proceed with MTDM services  Med obtained from pharmacy and shipped to clinic: JUBBON[facility]  PREFERRED FOR PHARMACY   Pharmacy benefit: Copay $150 (Paid to pharmacy) Admin Fee: 0% (Pay at clinic)  Prior Auth: N/A PA# Expiration Date:   # of doses approved:   Patient NOT eligible for Copay Card. Copay Card can make patient's cost as little as $25. Link to apply: [email or web address]://[email or web address]  ** This summary of benefits is an estimation of the patient's out-of-pocket cost. Exact cost may very based on individual plan coverage.

## 2024-07-31 ENCOUNTER — Other Ambulatory Visit: Payer: Self-pay | Admitting: *Deleted

## 2024-07-31 DIAGNOSIS — M810 Age-related osteoporosis without current pathological fracture: Secondary | ICD-10-CM

## 2024-07-31 MED ORDER — JUBBONTI 60 MG/ML ~~LOC~~ SOSY: 60.0000 mg | PREFILLED_SYRINGE | SUBCUTANEOUS | 1 refills | Status: DC

## 2024-07-31 MED ORDER — DENOSUMAB-BBDZ 60 MG/ML ~~LOC~~ SOSY
60.0000 mg | PREFILLED_SYRINGE | Freq: Once | SUBCUTANEOUS | Status: AC
  Administered 2024-08-27: 60 mg via SUBCUTANEOUS

## 2024-07-31 NOTE — Telephone Encounter (Signed)
 Prescription for Jubbonti  #1,1RF sent to [facility]. New referral initiated for Jubbonti .   Encounter closed. See referral.

## 2024-08-01 ENCOUNTER — Other Ambulatory Visit: Payer: Self-pay

## 2024-08-01 ENCOUNTER — Other Ambulatory Visit (HOSPITAL_COMMUNITY): Payer: Self-pay

## 2024-08-01 ENCOUNTER — Ambulatory Visit: Attending: Obstetrics and Gynecology

## 2024-08-01 DIAGNOSIS — Z79899 Other long term (current) drug therapy: Secondary | ICD-10-CM

## 2024-08-01 DIAGNOSIS — M810 Age-related osteoporosis without current pathological fracture: Secondary | ICD-10-CM

## 2024-08-01 MED ORDER — JUBBONTI 60 MG/ML ~~LOC~~ SOSY
60.0000 mg | PREFILLED_SYRINGE | SUBCUTANEOUS | 1 refills | Status: AC
  Filled 2024-08-01: qty 1, 180d supply, fill #0

## 2024-08-01 NOTE — Progress Notes (Signed)
  Pharmacotherapy Clinic  Referring Provider: Bobie Cathlyn JAYSON Nikki  Virtual Visit via Telephone Note  I connected with Rachel Duran on 08/01/2024 at 12:30 PM EST by telephone and verified that I am speaking with the correct person using two identifiers.  Location: Patient: home Provider: office   I discussed the limitations, risks, security and privacy concerns of performing an evaluation and management service by telephone and the availability of in person appointments. I also discussed with the patient that there may be a patient responsible charge related to this service. The patient expressed understanding and agreed to proceed.   HPI: Rachel Duran is a 69 y.o. female who presents to the pharmacotherapy clinic via telephone for initiation of therapy with Jubbonti  for Osteoporosis. Patient had previous reaction after 3rd injection of Evenity  including rash on chest about 7-10 days following dose. Patient states rash subsequently resolved and that she had no other changes in that time period that may have caused it. Provider agrees that rash may have been a reaction to Evenity  and elected to change to Jubbonti  at this time.   Patient Active Problem List   Diagnosis Date Noted   Hyperphosphatemia 02/20/2024   Fall with injury 11/21/2023   Chronic cough 06/20/2023   Onycholysis 04/10/2023   Grieving 06/27/2022   Elevated blood pressure reading in office without diagnosis of hypertension 05/31/2022   Closed nondisplaced fracture of lateral end of left clavicle 05/30/2022   Fusion of spine, cervical region    Cervical spine arthritis with nerve pain 12/29/2021   C6 cervical fracture (HCC) 12/29/2021   C5 cervical fracture (HCC) 12/21/2021   Advanced directives, counseling/discussion 09/23/2021   Swelling of right index finger 07/14/2021   Synovitis of right knee    Scoliosis 01/13/2021   Osteoporosis 07/02/2020   Status post left knee surgery 06/01/2020    Pulmonary nodule 05/27/2020   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   Right knee pain 05/05/2020   Positive ANA (antinuclear antibody) 06/05/2019   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   Unilateral primary osteoarthritis, left hip 01/28/2019   Common bile duct (CBD) obstruction (HCC)    Venous insufficiency of left lower extremity 07/04/2018   Dyslipidemia 09/22/2017   Acute non-recurrent sinusitis 08/02/2017   DDD (degenerative disc disease), cervical 04/03/2017   DDD (degenerative disc disease), lumbar 04/03/2017   Onychomycosis 03/21/2017   Dysphagia 10/18/2016   Encounter for chronic pain management 10/18/2016   Biliary stasis (HCC) 09/26/2016   Fever blister 08/18/2016   Dry mouth 06/06/2016   HNP (herniated nucleus pulposus), lumbar 09/23/2015   Iron deficiency 07/30/2015   Health maintenance examination 06/15/2015   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   Clavicle enlargement 12/13/2014   Prediabetes 12/13/2014   Plantar fasciitis, bilateral 09/11/2014   Medicare annual wellness visit, subsequent 06/10/2014   Abnormal thyroid  function test 06/10/2014   Chronic pain syndrome 06/10/2014   CFS (chronic fatigue syndrome) 04/08/2014   Postmenopausal atrophic vaginitis 10/19/2012   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   High-tone pelvic floor dysfunction 06/04/2011   Irritable bowel syndrome 06/04/2011   Cervical disc disorder with radiculopathy of cervical region 05/28/2010   APHTHOUS ULCERS 01/31/2008   Insomnia 11/09/2007   Constipation 10/11/2007   Allergic rhinitis 04/12/2007   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   AMLODIPINE  (NORVASC ) 2.5 MG TABLET    TAKE 1 TABLET BY MOUTH DAILY.   AMLODIPINE  (NORVASC ) 5 MG TABLET    Take 1 tablet (5 mg total) by mouth daily.   AMPHETAMINE -DEXTROAMPHETAMINE  (ADDERALL  XR) 30 MG 24 HR CAPSULE    TAKE 1 CAPSULE BY MOUTH EVERY MORNING   ARIPIPRAZOLE  (ABILIFY ) 5 MG TABLET    Take 5 mg by mouth daily.   CALCIUM CARBONATE-VITAMIN D  (CALTRATE 600+D PO)    Take 2 tablets by mouth daily.   CAMPHOR-MENTHOL -METHYL SAL (SALONPAS EX)    Apply 1 patch topically at bedtime.   CLINDAMYCIN  (CLEOCIN ) 150 MG CAPSULE    Take 600mg  1 hour prior to dental procedure   CYANOCOBALAMIN  (VITAMIN B12) 1000 MCG/ML INJECTION    INJECT 1ML INTO THE MUSCLE EVERY 21 DAYSAS DIRECTED   DENOSUMAB -BBDZ (JUBBONTI ) 60 MG/ML SOSY INJECTION    Inject 60 mg into the skin every 6 (six) months.   ESZOPICLONE  3 MG TABS    TAKE ONE TABLET BY MOUTH AT BEDTIME.TAKEIMMEDIATELY BEFORE BEDTIME   FERROUS SULFATE  325 (65 FE) MG TABLET    Take 1 tablet (325 mg total) by mouth 2 (two) times a week.   FLUTICASONE  (FLONASE ) 50 MCG/ACT NASAL SPRAY    Place 2 sprays into both nostrils as needed for allergies or rhinitis.   LAMOTRIGINE  (LAMICTAL ) 25 MG TABLET    Take 50 mg by mouth daily.   LIDOCAINE  (XYLOCAINE ) 5 % OINTMENT    Apply 1 Application topically as needed. Apply up to 4x/day- on LUE for nerve pain   LINZESS  290 MCG CAPS CAPSULE    TAKE 1 CAPSULE BY MOUTH ONCE DAILY   MORPHINE  (MSIR) 15 MG TABLET    TAKE ONE TABLET EVERY SIX HOURS IF NEEDED FOR SEVERE PAIN   NALOXEGOL  OXALATE (MOVANTIK ) 25 MG TABS TABLET    Take 1 tablet (25 mg total) by mouth daily.   NONFORMULARY OR COMPOUNDED ITEM    Testosterone  proprionate petrolatum (jar) 2% ointment  2 Grams S: Apply a small amount once daily x 5 days per week.  Disp:  30 grams, RF:  none. [facility].   ONDANSETRON  (ZOFRAN ) 4 MG TABLET    TAKE ONE TABLET BY MOUTH EVERY  8 HOURS AS NEEDED FOR NAUSEA AND VOMITING   PANTOPRAZOLE  (PROTONIX ) 40 MG TABLET    TAKE ONE TABLET BY MOUTH TWICE DAILY WITH MEALS   PHENAZOPYRIDINE  (PYRIDIUM ) 100 MG TABLET    Take 200 mg by mouth 3 (three) times daily as needed.   POLYETHYLENE GLYCOL (MIRALAX  / GLYCOLAX ) 17 G PACKET    Take 17 g by mouth 2 (two) times daily.   PRASTERONE  (INTRAROSA ) 6.5 MG INST    UNWRAP AND INSERT 1 SUPPOSITORY  VAGINALLY WITH PROVIDED  APPLICATOR AT BEDTIME   PREGABALIN  (LYRICA ) 75 MG CAPSULE    Take 1 capsule (75 mg total) by mouth 3 (three) times daily.   TOBRAMYCIN (TOBREX) 0.3 % OPHTHALMIC SOLUTION    SMARTSIG:In Eye(s)   URSODIOL  (ACTIGALL ) 300 MG CAPSULE    Take 1 capsule (300 mg total) by mouth 2 (two) times daily.  Modified Medications   No medications on file  Discontinued Medications   No medications on file  Allergies: Allergies[1]  Past Medical History: Past Medical History:  Diagnosis Date   Abdominal pain last 4 months   and nausea also   Allergy    Anemia    history of   Anxiety    Bilateral pneumonia 11/21/2023   Bipolar disorder (HCC)    atpical bipolar disorder   Cervical disc disease limited rom turning to left   hx. C6- C7 -hx. past fusion(bone graft used)   Cholecystitis    Chronic pain    DDD (degenerative disc disease), lumbar 09/2015   dextroscoliosis with multilevel DDD and facet arthrosis most notable for R foraminal disc protrusion L4/5 producing severe R neural foraminal stenosis abutting R L4 nerve root, moderate spinal canal and mild lat recesss and R neural foraminal stenosis L3/4 (MRI)   Depression    bipolar depression   Disorders of porphyrin metabolism    Felon of finger of left hand 11/10/2016   Fibromyalgia    GERD (gastroesophageal reflux disease)    Headache    occasionally   Hyperphosphatemia 2025   level 4.5   Internal hemorrhoids    Interstitial cystitis 06/06/2012   hx.   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 (Dr Jarold retinologist)   Tuberculosis    Ulcer     Social History: Social History   Socioeconomic History   Marital status: Married    Spouse name: Not on file   Number of children: Not on file   Years of education: Not on file   Highest education level: 12th grade  Occupational History   Occupation: retired    Associate Professor: UNEMPLOYED  Tobacco Use   Smoking status: Never    Passive exposure: Never   Smokeless tobacco: Never  Vaping Use   Vaping status: Never Used  Substance and Sexual Activity   Alcohol use: No    Alcohol/week: 0.0 standard drinks of alcohol   Drug use: No   Sexual activity: Yes    Partners: Male    Birth control/protection: Post-menopausal    Comment: vasectomy, 1st intercourse- 84 y.o., partner- 1, DES exposure unknown  Other Topics Concern   Not on file  Social History Narrative   Lives with husband and dog   Caring for 3 grandchildren after daughter Lenis) was killed 05/2022.   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   Activity: tries to walk dog (45 min 3x/wk), yoga   Diet: good water , fruits/vegetables daily      Rheum: [name]   Psychiatrist: Vincente   Surgery: Tanda   GIBETHA Buddy   Urology: Janit   Social Drivers of Health   Tobacco Use: Low Risk (07/12/2024)   Patient History    Smoking Tobacco Use: Never    Smokeless Tobacco Use: Never    Passive Exposure: Never  Financial Resource Strain: Low Risk (07/11/2024)   Overall Financial Resource Strain (CARDIA)     Difficulty of Paying Living Expenses: Not hard at  all  Food Insecurity: No Food Insecurity (07/11/2024)   Epic    Worried About Programme Researcher, Broadcasting/film/video in the Last Year: Never true    Ran Out of Food in the Last Year: Never true  Transportation Needs: No Transportation Needs (07/11/2024)   Epic    Lack of Transportation (Medical): No    Lack of Transportation (Non-Medical): No  Physical Activity: Insufficiently Active (07/11/2024)   Exercise Vital Sign    Days of Exercise per Week: 3 days    Minutes of Exercise per Session: 40 min  Stress: No Stress Concern Present (07/11/2024)   [employer] of Occupational Health - Occupational Stress Questionnaire    Feeling of Stress: Only a little  Social Connections: Socially Integrated (07/11/2024)   Social Connection and Isolation Panel    Frequency of Communication with Friends and Family: More than three times a week    Frequency of Social Gatherings with Friends and Family: Three times a week    Attends Religious Services: More than 4 times per year    Active Member of Clubs or Organizations: Yes    Attends Banker Meetings: More than 4 times per year    Marital Status: Married  Depression (PHQ2-9): Low Risk (07/12/2024)   Depression (PHQ2-9)    PHQ-2 Score: 4  Alcohol Screen: Not on file  Housing: Low Risk (07/11/2024)   Epic    Unable to Pay for Housing in the Last Year: No    Number of Times Moved in the Last Year: 0    Homeless in the Last Year: No  Utilities: Not At Risk (09/22/2022)   [facility] Utilities    Threatened with loss of utilities: No  Health Literacy: Not on file    Medication: Jubbonti  (denosumab -bbdz)  Assessment: Patient is appropriate to initiate therapy with Jubbonti .   Vitamin D  Lab Results  Component Value Date   VD25OH 50.56 10/23/2023    Calcium Lab Results  Component Value Date   CALCIUM 9.3 02/19/2024    DEXA scan, most current DEXA scan T-score is minus 2.6 on 12/26/23.  Plan:   -Counseled patient on purpose, proper use, and adverse effects of Jubbonti  (denosumab -bbdz).  Counseled patient that medication must be injected every 6 months by a healthcare professional.  Advised patient to take calcium 1200 mg daily and vitamin D  800 units daily.  Reviewed the most common adverse effects including risk of infection, osteonecrosis of the jaw, rash, and muscle/bone pain.  Patient confirms she does not have any major dental work planned at this time.  Reviewed with patient the signs/symptoms of low calcium and advised patient to alert us  if she experiences these symptoms.  - Patient will be scheduled for new start Jubbonti  (denosumab -bbdz) appointment at [facility] when medication arrives to office.  - Rx will be triaged to [facility] for courier to [facility].   I discussed the assessment and treatment plan with the patient. The patient was provided an opportunity to ask questions and all were answered. The patient agreed with the plan and demonstrated an understanding of the instructions.   The patient was advised to call back or seek an in-person evaluation if the symptoms worsen or if the condition fails to improve as anticipated.  I provided 20 minutes of non-face-to-face time during this encounter.  Delon Brow, PharmD, CSP, AAHIVP, CPP Clinical Pharmacist Practitioner - Medication Therapy Disease Management/Specialty Pharmacy Services 08/01/2024, 1:37 PM     [1]  Allergies Allergen Reactions   Celebrex  [Celecoxib ] Rash    Headaches   Inh [Isoniazid] Other (See Comments)    Hepatitis   Cymbalta  [Duloxetine  Hcl] Other (See Comments)    Urinary retention   Nucynta  [Tapentadol ] Other (See Comments)    Agitation Jerking in legs   Silenor  [Doxepin  Hcl] Other (See Comments)    Nightmare Dizziness  Stumbling upon walking Not effective   Benzoin Dermatitis   Duragesic -100 [Fentanyl ] Other (See Comments)     Cold intolerance Arms twitching Insomnia  Fatigue    Keflex  [Cephalexin ] Nausea And Vomiting   Codeine Phosphate Nausea And Vomiting   Demerol [Meperidine Hcl] Rash    Red skin   Evenity  [Romosozumab ] Rash    Rash on chest.   Lithobid [Lithium] Other (See Comments)    Sores in mouth    Sulfamethoxazole Rash

## 2024-08-01 NOTE — Progress Notes (Signed)
 Patient is agreeable to $150 copay and payment information has been collected. Patient aware she will need to speak with pharmacist before medication can be dispensed. MTDM visit scheduled. Will finish initial onboarding documentation once visit with Jen is complete.

## 2024-08-01 NOTE — Progress Notes (Signed)
 Specialty Pharmacy Initial Fill Coordination Note  Rachel Duran is a 69 y.o. female contacted today regarding initial fill of specialty medication(s) Denosumab -bbdz (Jubbonti )   Patient requested Courier to Provider Office   Delivery date: 08/06/24   Verified address: [facility]  [address] [phone]   Medication will be filled on: 08/05/24   Patient is aware of $150 copayment.

## 2024-08-02 ENCOUNTER — Other Ambulatory Visit: Payer: Self-pay

## 2024-08-05 ENCOUNTER — Ambulatory Visit: Payer: Self-pay

## 2024-08-05 ENCOUNTER — Other Ambulatory Visit: Payer: Self-pay

## 2024-08-05 NOTE — Telephone Encounter (Signed)
 FYI Only or Action Required?: FYI only for provider: decline in ER, requesting appointment or imaging order.  Patient was last seen in primary care on 07/12/2024 by Rilla Baller, MD.  Called Nurse Triage reporting Hip Pain, Groin Pain, and Back Pain.  Symptoms began last tuesday.  Interventions attempted: OTC medications: ibuprofen and Prescription medications: morphine  sulfate scheduled .  Symptoms are: stable.  Triage Disposition: Go to ED Now (or PCP Triage)  Patient/caregiver understands and will follow disposition?: No, wishes to speak with PCP      Reason for Disposition  [1] Abdominal pain AND [2] age > 60 years  Answer Assessment - Initial Assessment Questions Patient reports hip replacement Left side 2020. Usually no issues expect a twinge walking last Tuesday , walking the dogs 10 minutes in got  a pain not a twinge, it was left groin and left bursa area and left buttocks as well got home  next several days worse. Used to hip pain never groin pain. Back to today after resting over weekend. On osteporosis  injections knows can cause these symptoms also treated for bursitis. Pain is left groin/hip and buttocks, pain is 7/10  pain is constant  standing and walking far distances pain shows up. No redness/swelling seen.  Little back pain left side , has scoliosis but location of pain doesn't seem like typical . No changes in urination. Patient advised ER for symptoms patient declined ER at this time  wants to get order for imaging at outside radiology or at office. If worsening would seek ER . Will call [facility].   1. ONSET: When did the pain begin? (e.g., minutes, hours, days)     Last Tuesday  2. LOCATION: Where does it hurt? (upper, mid or lower back)     Left lower back into the groin area and lower abdominal pain constant  3. SEVERITY: How bad is the pain?  (e.g., Scale 1-10; mild, moderate, or severe)     7/10 Standing one position and walking, from sitting to  standing as well  Takes ibuprofen and takes morphine  sulfate scheduled  4. PATTERN: Is the pain constant? (e.g., yes, no; constant, intermittent)      Constant  worse with movement  5. RADIATION: Does the pain shoot into your legs or somewhere else?     Radiating to left groin and buttocks 6. CAUSE:  What do you think is causing the back pain?      Unknown does have scoliosis  8. MEDICINES: What have you taken so far for the pain? (e.g., nothing, acetaminophen , NSAIDS)     Taking ibuprofen  9. NEUROLOGIC SYMPTOMS: Do you have any weakness, numbness, or problems with bowel/bladder control?     Denies  10. OTHER SYMPTOMS: Do you have any other symptoms? (e.g., fever, abdomen pain, burning with urination, blood in urine)       Lower pelvic /abdominal pain   Patient denies chest pain , difficulty breathing, fever, dizziness, vomiting  Protocols used: Back Pain-A-AH Copied from CRM #[id number]. Topic: Clinical - Red Word Triage >> Aug 05, 2024 11:01 AM Adelita BRAVO wrote: [facility] that prompted transfer to Nurse Triage: Worsening pain in left hip and groin. Patient stated it is progressively getting worse.

## 2024-08-05 NOTE — Telephone Encounter (Signed)
 Noted! Thank you

## 2024-08-05 NOTE — Telephone Encounter (Signed)
 Called patient she has been set up for evaluation with Dr. Watt on 1/14. If any changes or questions she will reach out.

## 2024-08-05 NOTE — Telephone Encounter (Signed)
 Called [facility] spoke with Delon and advised patient decline ER and request for appointment

## 2024-08-06 ENCOUNTER — Encounter: Payer: Self-pay | Admitting: Family Medicine

## 2024-08-06 ENCOUNTER — Other Ambulatory Visit: Payer: Self-pay

## 2024-08-06 NOTE — Progress Notes (Unsigned)
 "    Rachel Duran T. Edna Rede, MD, CAQ Sports Medicine [facility] at [facility] [address] [place], [address]  Phone: [phone]  FAX: [phone]  Rachel Duran - 69 y.o. female  MRN [id number]  Date of Birth: 05-22-1956  Date: 08/07/2024  PCP: Rilla Baller, MD  Referral: Rilla Baller, MD  No chief complaint on file.  Subjective:   Rachel Duran is a 69 y.o. very pleasant female patient with There is no height or weight on file to calculate BMI. who presents with the following:  Discussed the use of AI scribe software for clinical note transcription with the patient, who gave verbal consent to proceed.  Patient presents with ongoing left hip, groin, back pain. History is significant for seronegative arthritis, [name] disease, scleroderma, chronic pain syndrome.  Multiple spine surgeries.  History of left-sided total hip arthroplasty. History of Present Illness     Review of Systems is noted in the HPI, as appropriate  Objective:   LMP 07/25/1998   GEN: No acute distress; alert,appropriate. PULM: Breathing comfortably in no respiratory distress PSYCH: Normally interactive.   Laboratory and Imaging Data:  Assessment and Plan:   No diagnosis found. Assessment & Plan   Medication Management during today's office visit: No orders of the defined types were placed in this encounter.  There are no discontinued medications.  Orders placed today for conditions managed today: No orders of the defined types were placed in this encounter.   Disposition: No follow-ups on file.  Dr[employer] One speech-to-text software was used for transcription in this dictation.  Possible transcriptional errors can occur using Animal nutritionist.   Signed,  Jacques DASEN. Beauden Tremont, MD   Outpatient Encounter Medications as of 08/07/2024  Medication Sig   ALPRAZolam  (XANAX ) 1 MG tablet Take 0.5 tablets (0.5 mg total) by mouth at  bedtime.   amLODipine  (NORVASC ) 2.5 MG tablet TAKE 1 TABLET BY MOUTH DAILY.   amLODipine  (NORVASC ) 5 MG tablet Take 1 tablet (5 mg total) by mouth daily.   amphetamine -dextroamphetamine  (ADDERALL  XR) 30 MG 24 hr capsule TAKE 1 CAPSULE BY MOUTH EVERY MORNING   ARIPiprazole  (ABILIFY ) 5 MG tablet Take 5 mg by mouth daily.   Calcium Carbonate-Vitamin D  (CALTRATE 600+D PO) Take 2 tablets by mouth daily.   Camphor-Menthol -Methyl Sal (SALONPAS EX) Apply 1 patch topically at bedtime.   clindamycin  (CLEOCIN ) 150 MG capsule Take 600mg  1 hour prior to dental procedure (Patient not taking: Reported on 07/12/2024)   cyanocobalamin  (VITAMIN B12) 1000 MCG/ML injection INJECT 1ML INTO THE MUSCLE EVERY 21 DAYSAS DIRECTED   denosumab -bbdz (JUBBONTI ) 60 MG/ML SOSY injection Inject 60 mg into the skin every 6 (six) months.   Eszopiclone  3 MG TABS TAKE ONE TABLET BY MOUTH AT BEDTIME.TAKEIMMEDIATELY BEFORE BEDTIME   ferrous sulfate  325 (65 FE) MG tablet Take 1 tablet (325 mg total) by mouth 2 (two) times a week.   fluticasone  (FLONASE ) 50 MCG/ACT nasal spray Place 2 sprays into both nostrils as needed for allergies or rhinitis.   lamoTRIgine  (LAMICTAL ) 25 MG tablet Take 50 mg by mouth daily.   lidocaine  (XYLOCAINE ) 5 % ointment Apply 1 Application topically as needed. Apply up to 4x/day- on LUE for nerve pain   LINZESS  290 MCG CAPS capsule TAKE 1 CAPSULE BY MOUTH ONCE DAILY   morphine  (MSIR) 15 MG tablet TAKE ONE TABLET EVERY SIX HOURS IF NEEDED FOR SEVERE PAIN   naloxegol  oxalate (MOVANTIK ) 25 MG TABS tablet Take 1 tablet (25 mg total)  by mouth daily.   NONFORMULARY OR COMPOUNDED ITEM Testosterone  proprionate petrolatum (jar) 2% ointment  2 Grams S: Apply a small amount once daily x 5 days per week.  Disp:  30 grams, RF:  none. [facility].   ondansetron  (ZOFRAN ) 4 MG tablet TAKE ONE TABLET BY MOUTH EVERY 8 HOURS AS NEEDED FOR NAUSEA AND VOMITING   pantoprazole  (PROTONIX ) 40 MG tablet TAKE ONE TABLET BY  MOUTH TWICE DAILY WITH MEALS   phenazopyridine  (PYRIDIUM ) 100 MG tablet Take 200 mg by mouth 3 (three) times daily as needed.   polyethylene glycol (MIRALAX  / GLYCOLAX ) 17 g packet Take 17 g by mouth 2 (two) times daily.   Prasterone  (INTRAROSA ) 6.5 MG INST UNWRAP AND INSERT 1 SUPPOSITORY  VAGINALLY WITH PROVIDED  APPLICATOR AT BEDTIME   pregabalin  (LYRICA ) 75 MG capsule Take 1 capsule (75 mg total) by mouth 3 (three) times daily.   tobramycin (TOBREX) 0.3 % ophthalmic solution SMARTSIG:In Eye(s)   ursodiol  (ACTIGALL ) 300 MG capsule Take 1 capsule (300 mg total) by mouth 2 (two) times daily.   Facility-Administered Encounter Medications as of 08/07/2024  Medication   [START ON 08/15/2024] denosumab  (PROLIA ) injection 60 mg   [START ON 08/08/2024] denosumab -bbdz (JUBBONTI ) injection 60 mg   "

## 2024-08-07 ENCOUNTER — Encounter: Payer: Self-pay | Admitting: Family Medicine

## 2024-08-07 ENCOUNTER — Ambulatory Visit
Admission: RE | Admit: 2024-08-07 | Discharge: 2024-08-07 | Disposition: A | Discharge status: Home / Self Care | Source: Ambulatory Visit | Attending: Family Medicine | Admitting: Family Medicine

## 2024-08-07 ENCOUNTER — Ambulatory Visit (INDEPENDENT_AMBULATORY_CARE_PROVIDER_SITE_OTHER): Admitting: Family Medicine

## 2024-08-07 VITALS — BP 122/70 | HR 88 | Temp 98.1°F | Ht 64.25 in | Wt 129.5 lb

## 2024-08-07 DIAGNOSIS — Z87898 Personal history of other specified conditions: Secondary | ICD-10-CM | POA: Diagnosis not present

## 2024-08-07 DIAGNOSIS — M25552 Pain in left hip: Secondary | ICD-10-CM

## 2024-08-07 DIAGNOSIS — R531 Weakness: Secondary | ICD-10-CM

## 2024-08-07 DIAGNOSIS — Z96642 Presence of left artificial hip joint: Secondary | ICD-10-CM

## 2024-08-07 DIAGNOSIS — R296 Repeated falls: Secondary | ICD-10-CM

## 2024-08-07 MED ORDER — PREDNISONE 20 MG PO TABS: ORAL_TABLET | ORAL | 0 refills | Status: AC

## 2024-08-08 ENCOUNTER — Encounter: Payer: Self-pay | Admitting: Family Medicine

## 2024-08-13 ENCOUNTER — Other Ambulatory Visit: Payer: Self-pay | Admitting: Physician Assistant

## 2024-08-13 ENCOUNTER — Ambulatory Visit: Payer: Self-pay | Admitting: Family Medicine

## 2024-08-13 ENCOUNTER — Ambulatory Visit

## 2024-08-13 NOTE — Telephone Encounter (Signed)
 Last Fill: 05/14/2024  Next Visit: 09/11/2024  Last Visit: 03/11/2024  Dx: Fibromyalgia   Current Dose per office note on 03/11/2024: Lyrica  3 times a day instead of 1 in the morning and 2 at bedtime   Okay to refill Lyrica ?

## 2024-08-14 ENCOUNTER — Encounter: Payer: Self-pay | Admitting: Family Medicine

## 2024-08-14 DIAGNOSIS — G9332 Myalgic encephalomyelitis/chronic fatigue syndrome: Secondary | ICD-10-CM

## 2024-08-14 MED ORDER — AMPHETAMINE-DEXTROAMPHET ER 30 MG PO CP24: ORAL_CAPSULE | ORAL | 0 refills | Status: AC

## 2024-08-14 NOTE — Telephone Encounter (Signed)
 ERx

## 2024-08-14 NOTE — Telephone Encounter (Signed)
 Name of Medication:  Adderall  XR   Name of Pharmacy:  [facility] [address] Last Fill or Written Date and Quantity:  05/15/24 #90 Last Office Visit and Type:  07/12/24, 3 mo chronic pain f/u Next Office Visit and Type:  10/14/24, annual exam Last Controlled Substance Agreement Date:  04/19/19 Last UDS:  04/19/19

## 2024-08-15 ENCOUNTER — Other Ambulatory Visit: Payer: Self-pay | Admitting: Family Medicine

## 2024-08-15 DIAGNOSIS — G894 Chronic pain syndrome: Secondary | ICD-10-CM

## 2024-08-16 ENCOUNTER — Encounter: Payer: Self-pay | Admitting: Family Medicine

## 2024-08-16 NOTE — Telephone Encounter (Signed)
 Requesting: Morphine   Contract: No UDS:  Last Visit: 07/12/2024 Next Visit: 10/14/2024 Last Refill: 07/06/24  Please Advise

## 2024-08-16 NOTE — Telephone Encounter (Signed)
 Copied from CRM #[id number]. Topic: Clinical - Prescription Issue >> Aug 15, 2024  4:48 PM Rachel Duran wrote: Pt asking if medication can be filled asap so she can have it before the storm Morphine  Sulfate

## 2024-08-16 NOTE — Telephone Encounter (Signed)
 ERx

## 2024-08-19 ENCOUNTER — Ambulatory Visit

## 2024-08-20 ENCOUNTER — Ambulatory Visit

## 2024-08-22 ENCOUNTER — Ambulatory Visit

## 2024-08-23 ENCOUNTER — Other Ambulatory Visit (HOSPITAL_COMMUNITY): Payer: Self-pay

## 2024-08-26 ENCOUNTER — Ambulatory Visit

## 2024-08-26 ENCOUNTER — Other Ambulatory Visit: Payer: Self-pay | Admitting: Family Medicine

## 2024-08-27 ENCOUNTER — Ambulatory Visit

## 2024-08-27 DIAGNOSIS — M810 Age-related osteoporosis without current pathological fracture: Secondary | ICD-10-CM | POA: Diagnosis not present

## 2024-08-27 NOTE — Progress Notes (Signed)
 1st Jubbonti  injection given SQ in the right arm, per Pt's preference. (IC, CCMA)

## 2024-08-28 ENCOUNTER — Ambulatory Visit

## 2024-08-28 NOTE — Progress Notes (Unsigned)
 "  Office Visit Note  Patient: Rachel Duran             Date of Birth: 05-02-1956           MRN: [id number]             PCP: Rilla Baller, MD Referring: Rilla Baller, MD Visit Date: 09/11/2024 Occupation: Data Unavailable  Subjective:  No chief complaint on file.   History of Present Illness: Rachel Duran is a 69 y.o. female ***     Activities of Daily Living:  Patient reports morning stiffness for *** {minute/hour:19697}.   Patient {ACTIONS;DENIES/REPORTS:21021675::Denies} nocturnal pain.  Difficulty dressing/grooming: {ACTIONS;DENIES/REPORTS:21021675::Denies} Difficulty climbing stairs: {ACTIONS;DENIES/REPORTS:21021675::Denies} Difficulty getting out of chair: {ACTIONS;DENIES/REPORTS:21021675::Denies} Difficulty using hands for taps, buttons, cutlery, and/or writing: {ACTIONS;DENIES/REPORTS:21021675::Denies}  No Rheumatology ROS completed.   PMFS History:  Patient Active Problem List   Diagnosis Date Noted   Hyperphosphatemia 02/20/2024   Fall with injury 11/21/2023   Chronic cough 06/20/2023   Onycholysis 04/10/2023   Grieving 06/27/2022   Elevated blood pressure reading in office without diagnosis of hypertension 05/31/2022   Closed nondisplaced fracture of lateral end of left clavicle 05/30/2022   Fusion of spine, cervical region    Cervical spine arthritis with nerve pain 12/29/2021   C6 cervical fracture (HCC) 12/29/2021   C5 cervical fracture (HCC) 12/21/2021   Advanced directives, counseling/discussion 09/23/2021   Swelling of right index finger 07/14/2021   Synovitis of right knee    Scoliosis 01/13/2021   Osteoporosis 07/02/2020   Status post left knee surgery 06/01/2020   Pulmonary nodule 05/27/2020   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   Right knee pain 05/05/2020   Positive ANA (antinuclear antibody) 06/05/2019   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   Unilateral primary osteoarthritis, left  hip 01/28/2019   Common bile duct (CBD) obstruction (HCC)    Venous insufficiency of left lower extremity 07/04/2018   Dyslipidemia 09/22/2017   Acute non-recurrent sinusitis 08/02/2017   DDD (degenerative disc disease), cervical 04/03/2017   DDD (degenerative disc disease), lumbar 04/03/2017   Onychomycosis 03/21/2017   Dysphagia 10/18/2016   Encounter for chronic pain management 10/18/2016   Biliary stasis (HCC) 09/26/2016   Fever blister 08/18/2016   Dry mouth 06/06/2016   HNP (herniated nucleus pulposus), lumbar 09/23/2015   Iron deficiency 07/30/2015   Health maintenance examination 06/15/2015   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   Clavicle enlargement 12/13/2014   Prediabetes 12/13/2014   Plantar fasciitis, bilateral 09/11/2014   Medicare annual wellness visit, subsequent 06/10/2014   Abnormal thyroid  function test 06/10/2014   Chronic pain syndrome 06/10/2014   CFS (chronic fatigue syndrome) 04/08/2014   Postmenopausal atrophic vaginitis 10/19/2012   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   High-tone pelvic floor dysfunction 06/04/2011   Irritable bowel syndrome 06/04/2011   Cervical disc disorder with radiculopathy of cervical region 05/28/2010   APHTHOUS ULCERS 01/31/2008   Insomnia 11/09/2007   Constipation 10/11/2007   Allergic rhinitis 04/12/2007   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 C6- C7 -hx. past fusion(bone graft used)   Cholecystitis    Chronic pain    DDD (degenerative disc  disease), lumbar 09/2015   dextroscoliosis with multilevel DDD and facet arthrosis most notable for R foraminal disc protrusion L4/5 producing severe R neural foraminal stenosis abutting R L4 nerve root, moderate spinal canal and mild lat recesss and R neural foraminal stenosis L3/4 (MRI)   Depression    bipolar depression   Disorders of porphyrin metabolism    Felon of finger of left hand 11/10/2016   Fibromyalgia    GERD (gastroesophageal reflux disease)    Headache    occasionally   Hyperphosphatemia 2025   level 4.5   Internal hemorrhoids    Interstitial cystitis 06/06/2012   hx.   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 (Dr Jarold retinologist)   Tuberculosis    Ulcer     Family History  Problem Relation Age of Onset   CAD Father 63       MI, nonsmoker   Esophageal cancer Father 35   Stomach cancer Father    Heart disease Father    Scleroderma Mother    Hypertension Mother    Heart disease Mother    Varicose Veins Mother    Esophageal cancer Paternal Grandfather    Stomach cancer Paternal Grandfather    Diabetes Maternal Grandmother    Arthritis Brother    Stroke Neg Hx    Colon cancer Neg Hx    Rectal cancer Neg Hx    Past Surgical History:  Procedure Laterality Date   ANTERIOR CERVICAL DECOMP/DISCECTOMY FUSION  2004   C5/6, C6/7   ANTERIOR CERVICAL DECOMP/DISCECTOMY FUSION  02/2016   C3/4, C4/5 with plating Gavin)   ANTERIOR CERVICAL DECOMP/DISCECTOMY FUSION  12/29/2021   Procedure: ANTERIOR CERVICAL DECOMPRESSION/ DISCECTOMY FUSION CERVICAL THREE - SEVEN;  Surgeon: Mavis Purchase, MD;   Location: [facility] OR;  Service: Neurosurgery;;   ANTERIOR CERVICAL DECOMPRESSION/DISCECTOMY FUSION 4 LEVELS N/A 12/29/2021   Procedure: CERVICAL FIVE-SIX CORPECTOMY,;  Surgeon: Mavis Purchase, MD;  Location[facility] OR;  Service: Neurosurgery;  Laterality: N/A;   AUGMENTATION MAMMAPLASTY Bilateral 03/25/2010   BREAST ENHANCEMENT SURGERY  2010   BREAST IMPLANT EXCHANGE  10/2014   exchange saline implants, B mastopexy/capsulorraphy ([facility])   BUNIONECTOMY Bilateral yrs ago   CESAREAN SECTION  1985   x 1   CHOLECYSTECTOMY  06/11/2012   Procedure: LAPAROSCOPIC CHOLECYSTECTOMY WITH INTRAOPERATIVE CHOLANGIOGRAM;  Surgeon: Camellia CHRISTELLA Blush, MD,FACS;  Location: [facility] ORS;  Service: General;  Laterality: N/A;   COLONOSCOPY  02/2018   done for positive cologuard - WNL, rpt 10 yrs Oma)   CYSTOSCOPY     ERCP  05/22/2012   Procedure: ENDOSCOPIC RETROGRADE CHOLANGIOPANCREATOGRAPHY (ERCP);  Surgeon: Gwendlyn ONEIDA Buddy, MD,FACG;  Location: [facility];  Service: Endoscopy;  Laterality: N/A;   ERCP N/A 09/17/2013   Procedure: ENDOSCOPIC RETROGRADE CHOLANGIOPANCREATOGRAPHY (ERCP);  Surgeon: Gwendlyn ONEIDA Buddy, MD;  Location: [facility];  Service: Endoscopy;  Laterality: N/A;   ERCP N/A  09/27/2018   Procedure: ENDOSCOPIC RETROGRADE CHOLANGIOPANCREATOGRAPHY (ERCP);  Surgeon: Aneita Gwendlyn DASEN, MD;  Location: [facility];  Service: Endoscopy;  Laterality: N/A;   ESOPHAGOGASTRODUODENOSCOPY  09/2016   WNL. esophagus dilated Oma)   HEMORRHOID BANDING  09/23/2013   --Dr. Camellia Blush   HERNIA REPAIR     inguinal   HYSTEROSCOPY W/ ENDOMETRIAL ABLATION     JOINT REPLACEMENT  03/12/2019   KNEE ARTHROSCOPY Right 04/06/2021   Procedure: RIGHT KNEE ARTHROSCOPY WITH DEBRIDEMENT;  Surgeon: Addie Cordella Hamilton, MD;  Location: [facility];  Service: Orthopedics;  Laterality: Right;   NASAL SINUS SURGERY     x5   PARTIAL KNEE ARTHROPLASTY Right 06/01/2020   Procedure: Right knee patellofemoral replacement;   Surgeon: Addie Cordella Hamilton, MD;  Location: [facility];  Service: Orthopedics;  Laterality: Right;   POSTERIOR CERVICAL FUSION/FORAMINOTOMY N/A 12/29/2021   Procedure: POSTERIOR CERVICAL FUSION CERVICAL THREE-SEVEN;  Surgeon: Mavis Purchase, MD;  Location: [facility] OR;  Service: Neurosurgery;  Laterality: N/A;   REMOVAL OF STONES  09/27/2018   Procedure: REMOVAL OF STONES;  Surgeon: Aneita Gwendlyn DASEN, MD;  Location: [facility];  Service: Endoscopy;;   SPHINCTEROTOMY  09/27/2018   Procedure: SPHINCTEROTOMY;  Surgeon: Aneita Gwendlyn DASEN, MD;  Location: [facility];  Service: Endoscopy;;   TONSILLECTOMY     removed as a child   TOTAL HIP ARTHROPLASTY Left 03/12/2019   Procedure: LEFT TOTAL HIP ARTHROPLASTY ANTERIOR APPROACH;  Jan, Lonni GRADE, MD)   UPPER GASTROINTESTINAL ENDOSCOPY     Social History[1] Social History   Social History Narrative   Lives with husband and dog   Caring for 3 grandchildren after daughter Lenis) was killed 05/2022.   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   Activity: tries to walk dog (45 min 3x/wk), yoga   Diet: good water , fruits/vegetables daily      Rheum: [name]   Psychiatrist: Vincente   Surgery: Blush   GIBETHA Aneita   Urology: Janit     Immunization History  Administered Date(s) Administered   Fluad Trivalent(High Dose 65+) 04/10/2023   INFLUENZA, HIGH DOSE SEASONAL PF 04/10/2024   Influenza Split 05/04/2011, 04/04/2012   Influenza Whole 05/25/2007, 04/28/2008, 04/30/2009, 03/26/2010   Influenza,inj,Quad PF,6+ Mos 04/16/2015, 04/15/2016, 05/16/2017, 04/10/2018, 04/19/2019, 04/07/2020, 04/20/2022   Influenza-Unspecified 03/25/2014   MMR 11/28/2017   Moderna Sars-Covid-2 Vaccination 09/02/2019, 09/30/2019, 04/07/2020, 02/02/2021   Pfizer(Comirnaty)Fall Seasonal Vaccine 12 years and older 05/25/2022, 04/26/2024   Pneumococcal Conjugate-13 08/02/2013   Pneumococcal Polysaccharide-23  07/30/2009   Td 11/01/2005, 11/10/2016   Zoster Recombinant(Shingrix) 07/21/2017, 11/03/2017     Objective: Vital Signs: LMP 07/25/1998    Physical Exam   Musculoskeletal Exam: ***  CDAI Exam: CDAI Score: -- Patient Global: --; Provider Global: -- Swollen: --; Tender: -- Joint Exam 09/11/2024   No joint exam has been documented for this visit   There is currently no information documented on the homunculus. Go to the Rheumatology activity and complete the homunculus joint exam.  Investigation: No additional findings.  Imaging: DG Hip Unilat W OR W/O Pelvis 2-3 Views Left Result Date: 08/13/2024 CLINICAL DATA:  Groin pain. EXAM: DG HIP (WITH OR WITHOUT PELVIS) 2-3V LEFT COMPARISON:  Plain films left hip 10/28/2019. FINDINGS: Left hip arthroplasty is in place. Hardware is intact without evidence of complicating feature. The right hip appears normal. No acute bony or joint abnormality is seen. Marked convex right lumbar scoliosis and advanced appearing degenerative disease are partially imaged. Soft tissues  are negative. IMPRESSION: 1. No acute abnormality. Left hip arthroplasty without evidence of complication. 2. Marked convex right lumbar scoliosis and advanced degenerative disease. Electronically Signed   By: Debby Prader M.D.   On: 08/13/2024 12:50    Recent Labs: Lab Results  Component Value Date   WBC 8.9 03/11/2024   HGB 12.2 03/11/2024   PLT 296 03/11/2024   NA 136 02/19/2024   K 4.4 02/19/2024   CL 99 02/19/2024   CO2 30 02/19/2024   GLUCOSE 103 (H) 02/19/2024   BUN 8 02/19/2024   CREATININE 0.55 02/19/2024   BILITOT 0.4 03/11/2024   ALKPHOS 111 10/23/2023   AST 17 03/11/2024   ALT 7 03/11/2024   PROT 6.5 03/11/2024   ALBUMIN  4.3 10/23/2023   CALCIUM 9.3 02/19/2024   GFRAA >60 03/13/2019    Speciality Comments: Amlodipine  5mg  2.5 mg because tablets crumble.   Procedures:  No procedures performed Allergies: Celebrex  [celecoxib ], Inh [isoniazid],  Cymbalta  [duloxetine  hcl], Nucynta  [tapentadol ], Silenor  [doxepin  hcl], Benzoin, Duragesic -100 [fentanyl ], Keflex  [cephalexin ], Codeine phosphate, Demerol [meperidine hcl], Evenity  [romosozumab ], Lithobid [lithium], and Sulfamethoxazole   Assessment / Plan:     Visit Diagnoses: No diagnosis found.  Orders: No orders of the defined types were placed in this encounter.  No orders of the defined types were placed in this encounter.   Face-to-face time spent with patient was *** minutes. Greater than 50% of time was spent in counseling and coordination of care.  Follow-Up Instructions: No follow-ups on file.   Daved JAYSON Gavel, CMA  Note - This record has been created using Animal nutritionist.  Chart creation errors have been sought, but may not always  have been located. Such creation errors do not reflect on  the standard of medical care.    [1]  Social History Tobacco Use   Smoking status: Never    Passive exposure: Never   Smokeless tobacco: Never  Vaping Use   Vaping status: Never Used  Substance Use Topics   Alcohol use: No    Alcohol/week: 0.0 standard drinks of alcohol   Drug use: No   "

## 2024-09-02 ENCOUNTER — Ambulatory Visit

## 2024-09-04 ENCOUNTER — Ambulatory Visit

## 2024-09-09 ENCOUNTER — Ambulatory Visit

## 2024-09-11 ENCOUNTER — Ambulatory Visit: Admitting: Rheumatology

## 2024-09-11 ENCOUNTER — Ambulatory Visit

## 2024-09-11 DIAGNOSIS — M349 Systemic sclerosis, unspecified: Secondary | ICD-10-CM

## 2024-09-11 DIAGNOSIS — M797 Fibromyalgia: Secondary | ICD-10-CM

## 2024-09-11 DIAGNOSIS — Z8269 Family history of other diseases of the musculoskeletal system and connective tissue: Secondary | ICD-10-CM

## 2024-09-11 DIAGNOSIS — R5383 Other fatigue: Secondary | ICD-10-CM

## 2024-09-11 DIAGNOSIS — Z96651 Presence of right artificial knee joint: Secondary | ICD-10-CM

## 2024-09-11 DIAGNOSIS — Z8639 Personal history of other endocrine, nutritional and metabolic disease: Secondary | ICD-10-CM

## 2024-09-11 DIAGNOSIS — M5030 Other cervical disc degeneration, unspecified cervical region: Secondary | ICD-10-CM

## 2024-09-11 DIAGNOSIS — Z8659 Personal history of other mental and behavioral disorders: Secondary | ICD-10-CM

## 2024-09-11 DIAGNOSIS — Z8719 Personal history of other diseases of the digestive system: Secondary | ICD-10-CM

## 2024-09-11 DIAGNOSIS — M810 Age-related osteoporosis without current pathological fracture: Secondary | ICD-10-CM

## 2024-09-11 DIAGNOSIS — M3500 Sicca syndrome, unspecified: Secondary | ICD-10-CM

## 2024-09-11 DIAGNOSIS — M47816 Spondylosis without myelopathy or radiculopathy, lumbar region: Secondary | ICD-10-CM

## 2024-09-11 DIAGNOSIS — M4185 Other forms of scoliosis, thoracolumbar region: Secondary | ICD-10-CM

## 2024-09-11 DIAGNOSIS — I7300 Raynaud's syndrome without gangrene: Secondary | ICD-10-CM

## 2024-09-11 DIAGNOSIS — G9332 Myalgic encephalomyelitis/chronic fatigue syndrome: Secondary | ICD-10-CM

## 2024-09-11 DIAGNOSIS — G894 Chronic pain syndrome: Secondary | ICD-10-CM

## 2024-09-11 DIAGNOSIS — R7303 Prediabetes: Secondary | ICD-10-CM

## 2024-09-11 DIAGNOSIS — M19041 Primary osteoarthritis, right hand: Secondary | ICD-10-CM

## 2024-09-11 DIAGNOSIS — M1712 Unilateral primary osteoarthritis, left knee: Secondary | ICD-10-CM

## 2024-09-11 DIAGNOSIS — Z96642 Presence of left artificial hip joint: Secondary | ICD-10-CM

## 2024-09-18 ENCOUNTER — Ambulatory Visit: Admitting: Internal Medicine

## 2024-09-25 ENCOUNTER — Ambulatory Visit

## 2024-10-08 ENCOUNTER — Other Ambulatory Visit

## 2024-10-08 ENCOUNTER — Ambulatory Visit

## 2024-10-14 ENCOUNTER — Encounter: Admitting: Family Medicine

## 2024-10-14 ENCOUNTER — Ambulatory Visit

## 2024-10-16 ENCOUNTER — Ambulatory Visit

## 2024-10-21 ENCOUNTER — Ambulatory Visit

## 2024-10-23 ENCOUNTER — Ambulatory Visit

## 2024-11-04 ENCOUNTER — Ambulatory Visit

## 2024-11-06 ENCOUNTER — Other Ambulatory Visit

## 2024-11-13 ENCOUNTER — Ambulatory Visit

## 2024-11-13 ENCOUNTER — Ambulatory Visit: Admitting: Pulmonary Disease

## 2024-11-18 ENCOUNTER — Ambulatory Visit

## 2024-11-20 ENCOUNTER — Ambulatory Visit

## 2024-11-25 ENCOUNTER — Ambulatory Visit

## 2024-11-27 ENCOUNTER — Ambulatory Visit

## 2025-06-02 ENCOUNTER — Encounter: Admitting: Obstetrics and Gynecology
